# Patient Record
Sex: Male | Born: 1935 | Race: White | Hispanic: No | Marital: Married | Smoking: Former smoker
Health system: Southern US, Community
[De-identification: ages and names within clinical notes are randomized; demographics above are authoritative.]

## PROBLEM LIST (undated history)

## (undated) DIAGNOSIS — I10 Essential (primary) hypertension: Secondary | ICD-10-CM

## (undated) DIAGNOSIS — C801 Malignant (primary) neoplasm, unspecified: Secondary | ICD-10-CM

## (undated) DIAGNOSIS — N186 End stage renal disease: Secondary | ICD-10-CM

## (undated) DIAGNOSIS — I2510 Atherosclerotic heart disease of native coronary artery without angina pectoris: Secondary | ICD-10-CM

## (undated) DIAGNOSIS — J151 Pneumonia due to Pseudomonas: Secondary | ICD-10-CM

## (undated) DIAGNOSIS — I480 Paroxysmal atrial fibrillation: Secondary | ICD-10-CM

## (undated) DIAGNOSIS — R0600 Dyspnea, unspecified: Secondary | ICD-10-CM

## (undated) DIAGNOSIS — I4891 Unspecified atrial fibrillation: Secondary | ICD-10-CM

## (undated) DIAGNOSIS — L89159 Pressure ulcer of sacral region, unspecified stage: Secondary | ICD-10-CM

## (undated) DIAGNOSIS — E8809 Other disorders of plasma-protein metabolism, not elsewhere classified: Secondary | ICD-10-CM

## (undated) DIAGNOSIS — D696 Thrombocytopenia, unspecified: Secondary | ICD-10-CM

## (undated) DIAGNOSIS — L89619 Pressure ulcer of right heel, unspecified stage: Secondary | ICD-10-CM

## (undated) DIAGNOSIS — J90 Pleural effusion, not elsewhere classified: Secondary | ICD-10-CM

## (undated) DIAGNOSIS — Z9989 Dependence on other enabling machines and devices: Secondary | ICD-10-CM

## (undated) DIAGNOSIS — K209 Esophagitis, unspecified without bleeding: Secondary | ICD-10-CM

## (undated) DIAGNOSIS — I219 Acute myocardial infarction, unspecified: Secondary | ICD-10-CM

## (undated) DIAGNOSIS — I5042 Chronic combined systolic (congestive) and diastolic (congestive) heart failure: Secondary | ICD-10-CM

## (undated) DIAGNOSIS — I442 Atrioventricular block, complete: Secondary | ICD-10-CM

## (undated) DIAGNOSIS — D649 Anemia, unspecified: Secondary | ICD-10-CM

## (undated) DIAGNOSIS — R319 Hematuria, unspecified: Secondary | ICD-10-CM

## (undated) DIAGNOSIS — I255 Ischemic cardiomyopathy: Secondary | ICD-10-CM

## (undated) DIAGNOSIS — R5381 Other malaise: Secondary | ICD-10-CM

## (undated) DIAGNOSIS — Z9889 Other specified postprocedural states: Secondary | ICD-10-CM

## (undated) DIAGNOSIS — R570 Cardiogenic shock: Secondary | ICD-10-CM

## (undated) HISTORY — DX: Ischemic cardiomyopathy: I25.5

## (undated) HISTORY — DX: Hematuria, unspecified: R31.9

## (undated) HISTORY — PX: HERNIA REPAIR: SHX51

## (undated) HISTORY — DX: Other specified postprocedural states: Z98.890

## (undated) HISTORY — DX: Pneumonia due to Pseudomonas: J15.1

## (undated) HISTORY — DX: Paroxysmal atrial fibrillation: I48.0

## (undated) HISTORY — DX: Cardiogenic shock: R57.0

## (undated) HISTORY — DX: Anemia, unspecified: D64.9

## (undated) HISTORY — DX: Atherosclerotic heart disease of native coronary artery without angina pectoris: I25.10

## (undated) HISTORY — DX: Chronic combined systolic (congestive) and diastolic (congestive) heart failure: I50.42

## (undated) HISTORY — DX: Atrioventricular block, complete: I44.2

## (undated) HISTORY — DX: Pleural effusion, not elsewhere classified: J90

## (undated) HISTORY — DX: Esophagitis, unspecified without bleeding: K20.90

## (undated) HISTORY — DX: Other disorders of plasma-protein metabolism, not elsewhere classified: E88.09

## (undated) HISTORY — DX: Thrombocytopenia, unspecified: D69.6

## (undated) HISTORY — DX: End stage renal disease: N18.6

## (undated) HISTORY — DX: Other malaise: R53.81

## (undated) HISTORY — PX: PROSTATE SURGERY: SHX751

---

## 2008-03-07 ENCOUNTER — Ambulatory Visit (HOSPITAL_COMMUNITY): Admission: RE | Admit: 2008-03-07 | Discharge: 2008-03-07 | Payer: Self-pay | Admitting: Ophthalmology

## 2008-03-20 ENCOUNTER — Ambulatory Visit (HOSPITAL_COMMUNITY): Admission: RE | Admit: 2008-03-20 | Discharge: 2008-03-20 | Payer: Self-pay | Admitting: Ophthalmology

## 2010-06-01 LAB — BASIC METABOLIC PANEL
BUN: 12 mg/dL (ref 6–23)
CO2: 29 mEq/L (ref 19–32)
Calcium: 9 mg/dL (ref 8.4–10.5)
Chloride: 98 mEq/L (ref 96–112)
Creatinine, Ser: 0.74 mg/dL (ref 0.4–1.5)
GFR calc Af Amer: >60 mL/min (ref >60)
GFR calc non Af Amer: >60 mL/min (ref >60)
Glucose, Bld: 167 mg/dL — ABNORMAL HIGH (ref 70–99)
Potassium: 4 mEq/L (ref 3.5–5.1)
Sodium: 134 mEq/L — ABNORMAL LOW (ref 135–145)

## 2010-06-01 LAB — GLUCOSE, CAPILLARY: Glucose-Capillary: 113 mg/dL — ABNORMAL HIGH (ref 70–99)

## 2010-06-01 LAB — HEMOGLOBIN AND HEMATOCRIT, BLOOD
HCT: 41.9 % (ref 39.0–52.0)
Hemoglobin: 14.1 g/dL (ref 13.0–17.0)

## 2010-06-02 LAB — GLUCOSE, CAPILLARY: Glucose-Capillary: 93 mg/dL (ref 70–99)

## 2011-08-04 ENCOUNTER — Other Ambulatory Visit: Payer: Self-pay | Admitting: Radiation Oncology

## 2011-09-11 ENCOUNTER — Other Ambulatory Visit: Payer: Self-pay | Admitting: Radiation Oncology

## 2016-06-14 ENCOUNTER — Emergency Department (HOSPITAL_COMMUNITY): Payer: Medicare Other

## 2016-06-14 ENCOUNTER — Encounter (HOSPITAL_COMMUNITY): Payer: Self-pay | Admitting: Emergency Medicine

## 2016-06-14 ENCOUNTER — Inpatient Hospital Stay (HOSPITAL_COMMUNITY)
Admission: EM | Admit: 2016-06-14 | Discharge: 2016-06-17 | DRG: 309 | Disposition: A | Discharge status: Home / Self Care | Payer: Medicare Other | Attending: Family Medicine | Admitting: Family Medicine

## 2016-06-14 DIAGNOSIS — Z7901 Long term (current) use of anticoagulants: Secondary | ICD-10-CM

## 2016-06-14 DIAGNOSIS — R072 Precordial pain: Secondary | ICD-10-CM

## 2016-06-14 DIAGNOSIS — N400 Enlarged prostate without lower urinary tract symptoms: Secondary | ICD-10-CM | POA: Diagnosis not present

## 2016-06-14 DIAGNOSIS — I10 Essential (primary) hypertension: Secondary | ICD-10-CM | POA: Diagnosis present

## 2016-06-14 DIAGNOSIS — E7800 Pure hypercholesterolemia, unspecified: Secondary | ICD-10-CM | POA: Diagnosis present

## 2016-06-14 DIAGNOSIS — J309 Allergic rhinitis, unspecified: Secondary | ICD-10-CM | POA: Diagnosis not present

## 2016-06-14 DIAGNOSIS — Z955 Presence of coronary angioplasty implant and graft: Secondary | ICD-10-CM

## 2016-06-14 DIAGNOSIS — Z87891 Personal history of nicotine dependence: Secondary | ICD-10-CM

## 2016-06-14 DIAGNOSIS — K219 Gastro-esophageal reflux disease without esophagitis: Secondary | ICD-10-CM | POA: Diagnosis present

## 2016-06-14 DIAGNOSIS — J302 Other seasonal allergic rhinitis: Secondary | ICD-10-CM | POA: Diagnosis present

## 2016-06-14 DIAGNOSIS — I480 Paroxysmal atrial fibrillation: Principal | ICD-10-CM | POA: Diagnosis present

## 2016-06-14 DIAGNOSIS — Z79899 Other long term (current) drug therapy: Secondary | ICD-10-CM

## 2016-06-14 DIAGNOSIS — R0609 Other forms of dyspnea: Secondary | ICD-10-CM | POA: Diagnosis not present

## 2016-06-14 DIAGNOSIS — R079 Chest pain, unspecified: Secondary | ICD-10-CM | POA: Diagnosis not present

## 2016-06-14 DIAGNOSIS — E785 Hyperlipidemia, unspecified: Secondary | ICD-10-CM | POA: Diagnosis present

## 2016-06-14 DIAGNOSIS — Z8546 Personal history of malignant neoplasm of prostate: Secondary | ICD-10-CM

## 2016-06-14 DIAGNOSIS — I4891 Unspecified atrial fibrillation: Secondary | ICD-10-CM | POA: Diagnosis not present

## 2016-06-14 DIAGNOSIS — R0600 Dyspnea, unspecified: Secondary | ICD-10-CM

## 2016-06-14 DIAGNOSIS — I248 Other forms of acute ischemic heart disease: Secondary | ICD-10-CM | POA: Diagnosis present

## 2016-06-14 DIAGNOSIS — I2510 Atherosclerotic heart disease of native coronary artery without angina pectoris: Secondary | ICD-10-CM | POA: Diagnosis present

## 2016-06-14 DIAGNOSIS — E1165 Type 2 diabetes mellitus with hyperglycemia: Secondary | ICD-10-CM | POA: Diagnosis present

## 2016-06-14 HISTORY — DX: Malignant (primary) neoplasm, unspecified: C80.1

## 2016-06-14 HISTORY — DX: Unspecified atrial fibrillation: I48.91

## 2016-06-14 HISTORY — DX: Essential (primary) hypertension: I10

## 2016-06-14 LAB — COMPREHENSIVE METABOLIC PANEL
ALT: 17 U/L (ref 17–63)
AST: 23 U/L (ref 15–41)
Albumin: 3.9 g/dL (ref 3.5–5.0)
Alkaline Phosphatase: 42 U/L (ref 38–126)
Anion gap: 7 (ref 5–15)
BUN: 16 mg/dL (ref 6–20)
CO2: 27 mmol/L (ref 22–32)
Calcium: 8.9 mg/dL (ref 8.9–10.3)
Chloride: 101 mmol/L (ref 101–111)
Creatinine, Ser: 1.08 mg/dL (ref 0.61–1.24)
GFR calc Af Amer: >60 mL/min (ref >60)
GFR calc non Af Amer: >60 mL/min (ref >60)
Glucose, Bld: 165 mg/dL — ABNORMAL HIGH (ref 65–99)
Potassium: 4.4 mmol/L (ref 3.5–5.1)
Sodium: 135 mmol/L (ref 135–145)
Total Bilirubin: 0.6 mg/dL (ref 0.3–1.2)
Total Protein: 7.1 g/dL (ref 6.5–8.1)

## 2016-06-14 LAB — CBC WITH DIFFERENTIAL/PLATELET
Basophils Absolute: 0 10*3/uL (ref 0.0–0.1)
Basophils Relative: 0 %
Eosinophils Absolute: 0.1 10*3/uL (ref 0.0–0.7)
Eosinophils Relative: 1 %
HCT: 39.4 % (ref 39.0–52.0)
Hemoglobin: 13 g/dL (ref 13.0–17.0)
Lymphocytes Relative: 8 %
Lymphs Abs: 0.8 10*3/uL (ref 0.7–4.0)
MCH: 30 pg (ref 26.0–34.0)
MCHC: 33 g/dL (ref 30.0–36.0)
MCV: 91 fL (ref 78.0–100.0)
Monocytes Absolute: 0.6 10*3/uL (ref 0.1–1.0)
Monocytes Relative: 6 %
Neutro Abs: 8.3 10*3/uL — ABNORMAL HIGH (ref 1.7–7.7)
Neutrophils Relative %: 85 %
Platelets: 203 10*3/uL (ref 150–400)
RBC: 4.33 MIL/uL (ref 4.22–5.81)
RDW: 14.1 % (ref 11.5–15.5)
WBC: 9.8 10*3/uL (ref 4.0–10.5)

## 2016-06-14 LAB — LIPID PANEL
Cholesterol: 110 mg/dL (ref 0–200)
HDL: 31 mg/dL — ABNORMAL LOW (ref >40)
LDL Cholesterol: 57 mg/dL (ref 0–99)
Total CHOL/HDL Ratio: 3.5 RATIO
Triglycerides: 108 mg/dL (ref <150)
VLDL: 22 mg/dL (ref 0–40)

## 2016-06-14 LAB — URINALYSIS, ROUTINE W REFLEX MICROSCOPIC
Bilirubin Urine: NEGATIVE
Glucose, UA: NEGATIVE mg/dL
Hgb urine dipstick: NEGATIVE
Ketones, ur: NEGATIVE mg/dL
Leukocytes, UA: NEGATIVE
Nitrite: NEGATIVE
Protein, ur: NEGATIVE mg/dL
Specific Gravity, Urine: 1.017 (ref 1.005–1.030)
pH: 6 (ref 5.0–8.0)

## 2016-06-14 LAB — TSH: TSH: 1.935 u[IU]/mL (ref 0.350–4.500)

## 2016-06-14 LAB — T4, FREE: Free T4: 1.44 ng/dL — ABNORMAL HIGH (ref 0.61–1.12)

## 2016-06-14 LAB — I-STAT TROPONIN, ED
Troponin i, poc: 0.01 ng/mL (ref 0.00–0.08)
Troponin i, poc: 0.05 ng/mL (ref 0.00–0.08)

## 2016-06-14 LAB — TROPONIN I: Troponin I: 0.17 ng/mL (ref <0.03)

## 2016-06-14 LAB — BRAIN NATRIURETIC PEPTIDE: B Natriuretic Peptide: 342 pg/mL — ABNORMAL HIGH (ref 0.0–100.0)

## 2016-06-14 LAB — MRSA PCR SCREENING: MRSA by PCR: NEGATIVE

## 2016-06-14 MED ORDER — METOPROLOL TARTRATE 5 MG/5ML IV SOLN: 5.0000 mg | Freq: Once | INTRAVENOUS | Status: DC

## 2016-06-14 MED ORDER — ATORVASTATIN CALCIUM 10 MG PO TABS
10.0000 mg | ORAL_TABLET | Freq: Every day | ORAL | Status: DC
  Administered 2016-06-14 – 2016-06-16 (×3): 10 mg via ORAL
  Filled 2016-06-14 (×3): qty 1

## 2016-06-14 MED ORDER — LORATADINE 10 MG PO TABS
10.0000 mg | ORAL_TABLET | Freq: Every day | ORAL | Status: DC
  Administered 2016-06-15 – 2016-06-17 (×3): 10 mg via ORAL
  Filled 2016-06-14 (×3): qty 1

## 2016-06-14 MED ORDER — SENNOSIDES-DOCUSATE SODIUM 8.6-50 MG PO TABS: 1.0000 | ORAL_TABLET | Freq: Every evening | ORAL | Status: DC | PRN

## 2016-06-14 MED ORDER — METOPROLOL TARTRATE 50 MG PO TABS: 75.0000 mg | ORAL_TABLET | Freq: Once | ORAL | Status: DC

## 2016-06-14 MED ORDER — SODIUM CHLORIDE 0.9 % IV BOLUS (SEPSIS)
500.0000 mL | Freq: Once | INTRAVENOUS | Status: AC
  Administered 2016-06-14: 500 mL via INTRAVENOUS

## 2016-06-14 MED ORDER — DILTIAZEM HCL-DEXTROSE 100-5 MG/100ML-% IV SOLN (PREMIX): 5.0000 mg/h | Freq: Once | INTRAVENOUS | Status: DC

## 2016-06-14 MED ORDER — METOPROLOL TARTRATE 25 MG PO TABS
75.0000 mg | ORAL_TABLET | Freq: Once | ORAL | Status: AC
  Administered 2016-06-14: 75 mg via ORAL
  Filled 2016-06-14: qty 3

## 2016-06-14 MED ORDER — SIMVASTATIN 20 MG PO TABS: 20.0000 mg | ORAL_TABLET | Freq: Every day | ORAL | Status: DC

## 2016-06-14 MED ORDER — DILTIAZEM HCL-DEXTROSE 100-5 MG/100ML-% IV SOLN (PREMIX)
15.0000 mg/h | INTRAVENOUS | Status: DC
  Administered 2016-06-15: 15 mg/h via INTRAVENOUS
  Administered 2016-06-15: 10 mg/h via INTRAVENOUS
  Administered 2016-06-15: 7.5 mg/h via INTRAVENOUS
  Filled 2016-06-14 (×3): qty 100

## 2016-06-14 MED ORDER — ACETAMINOPHEN 650 MG RE SUPP: 650.0000 mg | Freq: Four times a day (QID) | RECTAL | Status: DC | PRN

## 2016-06-14 MED ORDER — RIVAROXABAN 20 MG PO TABS
20.0000 mg | ORAL_TABLET | Freq: Every day | ORAL | Status: DC
  Administered 2016-06-14 – 2016-06-16 (×3): 20 mg via ORAL
  Filled 2016-06-14 (×3): qty 1

## 2016-06-14 MED ORDER — TAMSULOSIN HCL 0.4 MG PO CAPS
0.4000 mg | ORAL_CAPSULE | Freq: Every day | ORAL | Status: DC
  Administered 2016-06-15 – 2016-06-17 (×3): 0.4 mg via ORAL
  Filled 2016-06-14 (×3): qty 1

## 2016-06-14 MED ORDER — ONDANSETRON HCL 4 MG PO TABS: 4.0000 mg | ORAL_TABLET | Freq: Four times a day (QID) | ORAL | Status: DC | PRN

## 2016-06-14 MED ORDER — ACETAMINOPHEN 325 MG PO TABS: 650.0000 mg | ORAL_TABLET | Freq: Four times a day (QID) | ORAL | Status: DC | PRN

## 2016-06-14 MED ORDER — DILTIAZEM HCL-DEXTROSE 100-5 MG/100ML-% IV SOLN (PREMIX)
INTRAVENOUS | Status: AC
  Filled 2016-06-14: qty 100

## 2016-06-14 MED ORDER — SODIUM CHLORIDE 0.9 % IV SOLN
INTRAVENOUS | Status: DC
  Administered 2016-06-14: 18:00:00 via INTRAVENOUS

## 2016-06-14 MED ORDER — PANTOPRAZOLE SODIUM 40 MG PO TBEC
40.0000 mg | DELAYED_RELEASE_TABLET | Freq: Every day | ORAL | Status: DC
  Administered 2016-06-14 – 2016-06-17 (×4): 40 mg via ORAL
  Filled 2016-06-14 (×4): qty 1

## 2016-06-14 MED ORDER — DILTIAZEM HCL 25 MG/5ML IV SOLN
10.0000 mg | Freq: Once | INTRAVENOUS | Status: AC
  Administered 2016-06-14: 10 mg via INTRAVENOUS

## 2016-06-14 MED ORDER — METOPROLOL TARTRATE 75 MG PO TABS: 75.0000 mg | ORAL_TABLET | Freq: Two times a day (BID) | ORAL | 0 refills | Status: DC

## 2016-06-14 MED ORDER — ONDANSETRON HCL 4 MG/2ML IJ SOLN: 4.0000 mg | Freq: Four times a day (QID) | INTRAMUSCULAR | Status: DC | PRN

## 2016-06-14 NOTE — Progress Notes (Signed)
CRITICAL VALUE ALERT  Critical value received:  Troponin 0.17  Date of notification:  06/14/16  Time of notification:  6725  Critical value read back:Yes.    Nurse who received alert:  Aldona Lento, RN  MD notified (1st page):  Dr.Sheikh  Time of first page:  1853  MD notified (2nd page):  Time of second page:  Responding MD:   Time MD responded:

## 2016-06-14 NOTE — ED Notes (Addendum)
Pt ambulated around nursing desk and tolerated well. Hr between 97-128,steady gait, edp aware. Sob quickly resolved after sitting. Pt denies pain

## 2016-06-14 NOTE — ED Notes (Signed)
Pt stood to use urinal and hr around 110 and sob. Pt states that is nromal for him

## 2016-06-14 NOTE — ED Triage Notes (Signed)
Pt reports hx of afib without RVR.  States for the past 3 days he has been having chest pain with heaviness in center of chest going to left arm/shoulder.  Also short of breath.

## 2016-06-14 NOTE — ED Provider Notes (Signed)
[facility] DEPT Provider Note   CSN: [id number] Arrival date & time: 06/14/16  9518  By signing my name below, I, Higinio Plan, attest that this documentation has been prepared under the direction and in the presence of Julianne Rice, MD . Electronically Signed: Higinio Plan, Scribe. 06/14/2016. 10:17 AM.  History   Chief Complaint Chief Complaint  Patient presents with  . Chest Pain  . Tachycardia   The history is provided by the patient. No language interpreter was used.   HPI Comments: Jesse Murphy is a 83 y.o. male with PMHx of A-Fib on Xarelto and cardiac stent placement in 1992, brought in by EMS to the Emergency Department complaining of gradually worsening, constant, bilateral chest pain that began 3 days ago. Pt describes his pain as heaviness across his chest that radiates down his left arm and shoulder. He states associated shortness of breath and cough. He notes he takes all of his medications as prescribed and has not missed any doses. Pt denies any fever, recent sickness, or leg swelling.   Past Medical History:  Diagnosis Date  . Atrial fibrillation ([place])   . Cancer [facility])    prostate  . Hypertension     There are no active problems to display for this patient.  Past Surgical History:  Procedure Laterality Date  . HERNIA REPAIR      Home Medications    Prior to Admission medications   Medication Sig Start Date End Date Taking? Authorizing Provider  loratadine (CLARITIN) 10 MG tablet Take 10 mg by mouth daily.   Yes Historical Provider, MD  omeprazole (PRILOSEC) 20 MG capsule Take 20 mg by mouth daily as needed (acid reflux).   Yes Historical Provider, MD  rivaroxaban (XARELTO) 20 MG TABS tablet Take 20 mg by mouth daily with supper.   Yes Historical Provider, MD  simvastatin (ZOCOR) 20 MG tablet Take 20 mg by mouth daily.   Yes Historical Provider, MD  tamsulosin (FLOMAX) 0.4 MG CAPS capsule Take 0.4 mg by mouth daily.   Yes Historical Provider, MD    metoprolol 75 MG TABS Take 75 mg by mouth 2 (two) times daily. 06/14/16   Julianne Rice, MD    Family History History reviewed. No pertinent family history.  Social History Social History  Substance Use Topics  . Smoking status: Former Smoker    Types: Cigarettes    Quit date: 02/15/1990  . Smokeless tobacco: Never Used  . Alcohol use No   Allergies   Patient has no known allergies.  Review of Systems Review of Systems  Constitutional: Negative for fever.  Respiratory: Positive for cough, chest tightness and shortness of breath. Negative for wheezing.   Cardiovascular: Positive for chest pain and palpitations. Negative for leg swelling.  Gastrointestinal: Negative for abdominal pain, nausea and vomiting.  Musculoskeletal: Negative for back pain and myalgias.  Skin: Negative for rash and wound.  Neurological: Negative for dizziness, weakness, light-headedness, numbness and headaches.  All other systems reviewed and are negative.  Physical Exam Updated Vital Signs BP (!) 122/96 (BP Location: Left Arm)   Pulse (!) 147   Temp 98.1 F (36.7 C) (Oral)   Resp (!) 22   Ht 5\' 9"  (1.753 m)   Wt 206 lb (93.4 kg)   SpO2 96%   BMI 30.42 kg/m   Physical Exam  Constitutional: He is oriented to person, place, and time. He appears well-developed and well-nourished.  HENT:  Head: Normocephalic and atraumatic.  Mouth/Throat: Oropharynx is clear and  moist.  Eyes: EOM are normal. Pupils are equal, round, and reactive to light.  Neck: Normal range of motion. Neck supple.  Cardiovascular:  Tachycardia. Irregularly irregular  Pulmonary/Chest: Effort normal and breath sounds normal.  Abdominal: Soft. Bowel sounds are normal. There is no tenderness. There is no rebound and no guarding.  Musculoskeletal: Normal range of motion. He exhibits no edema or tenderness.  No lower extremity swelling or asymmetry.  Neurological: He is alert and oriented to person, place, and time.  Moving all  extremities without deficit. Sensation fully intact.  Skin: Skin is warm and dry. No rash noted. No erythema.  Psychiatric: He has a normal mood and affect. His behavior is normal.  Nursing note and vitals reviewed.   ED Treatments / Results  DIAGNOSTIC STUDIES:  Oxygen Saturation is 96% on RA, normal by my interpretation.    COORDINATION OF CARE:  10:17 AM Discussed treatment plan with pt at bedside and pt agreed to plan.  Labs (all labs ordered are listed, but only abnormal results are displayed) Labs Reviewed  CBC WITH DIFFERENTIAL/PLATELET - Abnormal; Notable for the following:       Result Value   Neutro Abs 8.3 (*)    All other components within normal limits  COMPREHENSIVE METABOLIC PANEL - Abnormal; Notable for the following:    Glucose, Bld 165 (*)    All other components within normal limits  URINALYSIS, ROUTINE W REFLEX MICROSCOPIC  I-STAT TROPOININ, ED  I-STAT TROPOININ, ED    EKG  EKG Interpretation  Date/Time:  Monday June 14 2016 10:04:58 EDT Ventricular Rate:  140 PR Interval:    QRS Duration: 125 QT Interval:  312 QTC Calculation: 477 R Axis:   87 Text Interpretation:  Wide-QRS tachycardia Nonspecific intraventricular conduction delay Confirmed by Lita Mains  MD, Srijan Givan ([id number]) on 06/14/2016 2:59:30 PM       Radiology Dg Chest Port 1 View  Result Date: 06/14/2016 CLINICAL DATA:  Anterior chest pain for 3 days. EXAM: PORTABLE CHEST 1 VIEW COMPARISON:  07/29/2014. FINDINGS: 1022 hour. Lordotic positioning. The heart size and mediastinal contours are normal. The lungs are clear. There is no pleural effusion or pneumothorax. No acute osseous findings are identified. Telemetry leads overlie the chest. IMPRESSION: Stable chest.  No active cardiopulmonary process. Electronically Signed   By: Richardean Sale M.D.   On: 06/14/2016 10:37    Procedures Procedures (including critical care time)  Medications Ordered in ED Medications  dilTIAZem HCl-Dextrose  100-5 MG/100ML-% infusion SOLN (5 mg  Rate/Dose Change 06/14/16 1301)  diltiazem (CARDIZEM) 100 mg in dextrose 5% 170mL (1 mg/mL) infusion (not administered)  sodium chloride 0.9 % bolus 500 mL (not administered)  diltiazem (CARDIZEM) injection 10 mg (10 mg Intravenous Bolus 06/14/16 1027)  metoprolol tartrate (LOPRESSOR) tablet 75 mg (75 mg Oral Given 06/14/16 1319)   CRITICAL CARE Performed by: Lita Mains, Miley Blanchett Total critical care time: 40 minutes Critical care time was exclusive of separately billable procedures and treating other patients. Critical care was necessary to treat or prevent imminent or life-threatening deterioration. Critical care was time spent personally by me on the following activities: development of treatment plan with patient and/or surrogate as well as nursing, discussions with consultants, evaluation of patient's response to treatment, examination of patient, obtaining history from patient or surrogate, ordering and performing treatments and interventions, ordering and review of laboratory studies, ordering and review of radiographic studies, pulse oximetry and re-evaluation of patient's condition. Initial Impression / Assessment and Plan / ED Course  I have reviewed the triage vital signs and the nursing notes.  Pertinent labs & imaging results that were available during my care of the patient were reviewed by me and considered in my medical decision making (see chart for details).     I personally performed the services described in this documentation, which was scribed in my presence. The recorded information has been reviewed and is accurate.   Patient states his symptoms have significantly improved after IV dose of Cardizem. Heart rate is around 100. Blood pressure remained stable.  Patient given dose of by mouth metoprolol. Heart rate is elevated his and to the 120s. Patient denies any chest pain or shortness of breath at rest but with ambulation becomes unsteady  and shortness breath. Initiated Cardizem drip. Discussed with hospitalist who will admit patient for stepdown observation. Troponin 2 are negative. Final Clinical Impressions(s) / ED Diagnoses   Final diagnoses:  Atrial fibrillation with RVR (HCC)  DOE (dyspnea on exertion)  Precordial pain    New Prescriptions Current Discharge Medication List       Julianne Rice, MD 06/14/16 1500

## 2016-06-14 NOTE — H&P (Signed)
History and Physical    Jesse Murphy WFU:[id number] DOB: [birth year] DOA: 06/14/2016  PCP: Deloria Lair, MD   Patient coming from: Home  Chief Complaint: Palpitations, Chest Pressure and SOB  HPI: Jesse Murphy is a 83 y.o. male with medical history significant of HTN, HLD, ?DM (was previously on Metformin), BPH, Seasonal Allergies/Allergic Rhinitis, GERD, Paroxysmal Atrial Fibrillation on Anticoagulation with Xarelto who presented to [facility] ED with a cc of Chest Pressure that radiated into his Left arm and shoulder, Palpations and Shortness of Breath worsening while ambulating. Patient has a Hx of CAD and is s/p a stent in 1994. Patient states 3 days ago he was digging leaves out of his ditch when he started developing Palpitations and SOB which progressed to chest pressure that radiated to his arms and shoulder. He stated that he becomes more winded and family has noted that he wasn't able to walk as far before getting SOB. Patient has had very mild leg swelling. Describes the pain/heaviness as a 9/10 in severity but has gone away now. Denied N/V or lightheadedness or dizziness. No diaphoresis. Workup in the ED showed patient was in A Fib with RVR. [facility] was called to admit the patient.   ED Course: Was given po Metoprolol, IV Diltiazem, and placed on Diltiazem gtt and a 500 NS bolus was orderd (not currently given). Bloodwork was done and POC Troponin was ordered x2 (first 0.01 and second 0.05). EKG was done and CXR was done as well (showed stable chest with no active cardiopulmonary disease.)  Review of Systems: As per HPI otherwise 10 point review of systems negative. Denied Constipation or Diarrhea. No Urinary difficulties.   Past Medical History:  Diagnosis Date  . Atrial fibrillation ([place])   . Cancer [facility])    prostate  . Hypertension     Past Surgical History:  Procedure Laterality Date  . HERNIA REPAIR     SOCIAL HISTORY  reports that he quit smoking about 26 years ago. His  smoking use included Cigarettes. He has never used smokeless tobacco. He reports that he does not drink alcohol or use drugs.  No Known Allergies  FAMILY HISTORY History reviewed with patient and he stated that his entire family had "heart troubles" but was unable to specify what troubles these were.   Prior to Admission medications   Medication Sig Start Date End Date Taking? Authorizing Provider  loratadine (CLARITIN) 10 MG tablet Take 10 mg by mouth daily.   Yes Historical Provider, MD  omeprazole (PRILOSEC) 20 MG capsule Take 20 mg by mouth daily as needed (acid reflux).   Yes Historical Provider, MD  rivaroxaban (XARELTO) 20 MG TABS tablet Take 20 mg by mouth daily with supper.   Yes Historical Provider, MD  simvastatin (ZOCOR) 20 MG tablet Take 20 mg by mouth daily.   Yes Historical Provider, MD  tamsulosin (FLOMAX) 0.4 MG CAPS capsule Take 0.4 mg by mouth daily.   Yes Historical Provider, MD  metoprolol 75 MG TABS Take 75 mg by mouth 2 (two) times daily. 06/14/16   Julianne Rice, MD   Physical Exam: Vitals:   06/14/16 1403 06/14/16 1404 06/14/16 1405 06/14/16 1406  BP: 117/79     Pulse: (!) 127 (!) 120 (!) 102 (!) 42  Resp: 20 11 19  (!) 21  Temp:      TempSrc:      SpO2: 96% 96% 97% 97%  Weight:      Height:  Constitutional: WN/WD, NAD Caucasian male who and appears calm and comfortable and younger than stated age Eyes: Lids and conjunctivae normal, sclerae anicteric  ENMT: External Ears, Nose appear normal. Grossly normal hearing. Mucous membranes are moist.  Neck: Appears normal, supple, no cervical masses, normal ROM, no appreciable thyromegaly, no appreciable JVD Respiratory: Diminished but clear to auscultation bilaterally, no wheezing, rales, rhonchi or crackles. Normal respiratory effort and patient is not tachypenic. No accessory muscle use.  Cardiovascular: Irregularly Irregular Rate and Tachycardic Rhythm, no murmurs / rubs / gallops. S1 and S2 auscultated.  Trace lower extremity edema bilaterllay.  Abdomen: Soft, non-tender, non-distended. No masses palpated. No appreciable hepatosplenomegaly. Bowel sounds positive x4.  GU: Deferred. Musculoskeletal: No clubbing / cyanosis of digits/nails. No joint deformity upper and lower extremities. Good ROM, no contractures.  Skin: No rashes, lesions, ulcers on limited skin evaluation. No induration; Warm and dry.  Neurologic: CN 2-12 grossly intact with no focal deficits. Romberg sign cerebellar reflexes not assessed.  Psychiatric: Normal judgment and insight. Alert and oriented x 3. Normal mood and appropriate affect.   Labs on Admission: I have personally reviewed following labs and imaging studies  CBC:  Recent Labs Lab 06/14/16 1035  WBC 9.8  NEUTROABS 8.3*  HGB 13.0  HCT 39.4  MCV 91.0  PLT 177   Basic Metabolic Panel:  Recent Labs Lab 06/14/16 1035  NA 135  K 4.4  CL 101  CO2 27  GLUCOSE 165*  BUN 16  CREATININE 1.08  CALCIUM 8.9   GFR: Estimated Creatinine Clearance: 56.4 mL/min (by C-G formula based on SCr of 1.08 mg/dL). Liver Function Tests:  Recent Labs Lab 06/14/16 1035  AST 23  ALT 17  ALKPHOS 42  BILITOT 0.6  PROT 7.1  ALBUMIN 3.9   No results for input(s): LIPASE, AMYLASE in the last 168 hours. No results for input(s): AMMONIA in the last 168 hours. Coagulation Profile: No results for input(s): INR, PROTIME in the last 168 hours. Cardiac Enzymes: No results for input(s): CKTOTAL, CKMB, CKMBINDEX, TROPONINI in the last 168 hours. BNP (last 3 results) No results for input(s): PROBNP in the last 8760 hours. HbA1C: No results for input(s): HGBA1C in the last 72 hours. CBG: No results for input(s): GLUCAP in the last 168 hours. Lipid Profile: No results for input(s): CHOL, HDL, LDLCALC, TRIG, CHOLHDL, LDLDIRECT in the last 72 hours. Thyroid Function Tests: No results for input(s): TSH, T4TOTAL, FREET4, T3FREE, THYROIDAB in the last 72 hours. Anemia  Panel: No results for input(s): VITAMINB12, FOLATE, FERRITIN, TIBC, IRON, RETICCTPCT in the last 72 hours. Urine analysis:    Component Value Date/Time   COLORURINE YELLOW 06/14/2016 1019   APPEARANCEUR CLEAR 06/14/2016 1019   LABSPEC 1.017 06/14/2016 1019   PHURINE 6.0 06/14/2016 1019   GLUCOSEU NEGATIVE 06/14/2016 1019   HGBUR NEGATIVE 06/14/2016 1019   BILIRUBINUR NEGATIVE 06/14/2016 1019   KETONESUR NEGATIVE 06/14/2016 1019   PROTEINUR NEGATIVE 06/14/2016 1019   NITRITE NEGATIVE 06/14/2016 1019   LEUKOCYTESUR NEGATIVE 06/14/2016 1019   Sepsis Labs: !!!!!!!!!!!!!!!!!!!!!!!!!!!!!!!!!!!!!!!!!!!! @LABRCNTIP (procalcitonin:4,lacticidven:4) )No results found for this or any previous visit (from the past 240 hour(s)).   Radiological Exams on Admission: Dg Chest Port 1 View  Result Date: 06/14/2016 CLINICAL DATA:  Anterior chest pain for 3 days. EXAM: PORTABLE CHEST 1 VIEW COMPARISON:  07/29/2014. FINDINGS: 1022 hour. Lordotic positioning. The heart size and mediastinal contours are normal. The lungs are clear. There is no pleural effusion or pneumothorax. No acute osseous findings are identified. Telemetry  leads overlie the chest. IMPRESSION: Stable chest.  No active cardiopulmonary process. Electronically Signed   By: Richardean Sale M.D.   On: 06/14/2016 10:37   EKG: Independently reviewed. Showed A Fib with RVR with an approximate rate of 110 and had Ashman beats. No evidence of ST Elevation on my interpretation but had some ST Depression likely rate related.   Assessment/Plan Active Problems:   Atrial fibrillation with RVR (HCC)  Paroxysmal Atrial Fibrillation with RVR -Place in Obs SDU; HR usually Rate Controlled -Given 75 mg of po Metoprolol, IV 10 mg of Cardizem, and placed on Cardizem gtt in ED -C/w Cardizem gtt and titrate off -Obtain ECHOCardiogram when rate is better controlled -Anticoagulated with Xarelto 20 mg po qHS as CHADS2VASc was at least 3 for Age and HTN -Will  need to place on po Control with Metoprolol (Takes 50 mg po BID) -Consulted Cardiology for additional Recc's and Evaluation -Check TSH, Free T4, Mag, and Phos -Given NS Bolus 500 mL in ED; Will Hydrate with NS at 75 mL/hr  Chest Pressure r/o ACS -Likely from Uncontrolled Heart Rate; Patient complained of Chest Pressure and SOB along with not being able to ambulate without getting SOB -Has Hx of CAD s/p Stent in 1994 -Cycle Cardiac Troponin I x 3; First POC Troponin was 0.01 and next POC Troponin was 0.05 -Check ECHOCardiogram -Hx of Abnormal Stress Test in 2016; Placed on Medical Therapy at that time. -Check TSH, Free T4, Mag, Phos, Lipid Panel, and HbA1c -Consulted Cardiology for additional Recc's and Evaluation  Dyspena on Exertion -Likely from Above -Check ECHOcardiogram -Appreciate Cardiology Consultation for additional Recc's and Evaluation -Will need PT Evaluation prior to D/C  Seasonal Allergies/Allergic Rhinitis -C/w Loratadine 10 mg po Daily -Also Takes Fluticasone 50 mcg/Actuation Nasal Spar  GERD -C/w PPI Pharmacy Protonix 40 mg po Substitution  Hyperlipidemia -Check Lipid Panel -C/w Simvastatin 20 mgpo Daily  BPH; Hx of Prostate Cancer -C/w Tamsulosin 0.4 mg po Daily  Hyperglycemia -Check HbA1c -Per prior record from [place] showed patient was on Metformin 500 mg po Daily  DVT prophylaxis: Anticoagulated with Xarelto 20 mg po qHS Code Status: FULL CODE Family Communication: Discussed with wife and daughter at bedside Disposition Plan: Likely Home at D/C Consults called: Cardiology Admission status: Obs/SDU   Kerney Elbe, D.O. [facility] Pager [phone]  If 7PM-7AM, please contact night-coverage [email or web address] Password [email or web address]  06/14/2016, 3:14 PM

## 2016-06-14 NOTE — ED Notes (Signed)
Per edp, will admit pt.

## 2016-06-14 NOTE — ED Notes (Signed)
Bolus of Cardizem given from bag.  No drip started at this time.  Pt states he feels better and is not as short of breath.  MD made aware.

## 2016-06-14 NOTE — ED Notes (Signed)
Pharmacy checking on metoprolol

## 2016-06-15 ENCOUNTER — Observation Stay (HOSPITAL_BASED_OUTPATIENT_CLINIC_OR_DEPARTMENT_OTHER): Payer: Medicare Other

## 2016-06-15 ENCOUNTER — Encounter (HOSPITAL_COMMUNITY): Payer: Self-pay | Admitting: Adult Health

## 2016-06-15 ENCOUNTER — Observation Stay (HOSPITAL_COMMUNITY): Payer: Medicare Other

## 2016-06-15 DIAGNOSIS — N400 Enlarged prostate without lower urinary tract symptoms: Secondary | ICD-10-CM | POA: Diagnosis not present

## 2016-06-15 DIAGNOSIS — I480 Paroxysmal atrial fibrillation: Secondary | ICD-10-CM | POA: Diagnosis present

## 2016-06-15 DIAGNOSIS — I25118 Atherosclerotic heart disease of native coronary artery with other forms of angina pectoris: Secondary | ICD-10-CM

## 2016-06-15 DIAGNOSIS — E1165 Type 2 diabetes mellitus with hyperglycemia: Secondary | ICD-10-CM | POA: Diagnosis present

## 2016-06-15 DIAGNOSIS — Z955 Presence of coronary angioplasty implant and graft: Secondary | ICD-10-CM | POA: Diagnosis not present

## 2016-06-15 DIAGNOSIS — E785 Hyperlipidemia, unspecified: Secondary | ICD-10-CM | POA: Diagnosis present

## 2016-06-15 DIAGNOSIS — R072 Precordial pain: Secondary | ICD-10-CM | POA: Diagnosis present

## 2016-06-15 DIAGNOSIS — E782 Mixed hyperlipidemia: Secondary | ICD-10-CM

## 2016-06-15 DIAGNOSIS — R0609 Other forms of dyspnea: Secondary | ICD-10-CM

## 2016-06-15 DIAGNOSIS — R079 Chest pain, unspecified: Secondary | ICD-10-CM

## 2016-06-15 DIAGNOSIS — I4891 Unspecified atrial fibrillation: Secondary | ICD-10-CM

## 2016-06-15 DIAGNOSIS — I10 Essential (primary) hypertension: Secondary | ICD-10-CM | POA: Diagnosis present

## 2016-06-15 DIAGNOSIS — Z7901 Long term (current) use of anticoagulants: Secondary | ICD-10-CM | POA: Diagnosis not present

## 2016-06-15 DIAGNOSIS — K219 Gastro-esophageal reflux disease without esophagitis: Secondary | ICD-10-CM | POA: Diagnosis present

## 2016-06-15 DIAGNOSIS — J302 Other seasonal allergic rhinitis: Secondary | ICD-10-CM | POA: Diagnosis present

## 2016-06-15 DIAGNOSIS — E7800 Pure hypercholesterolemia, unspecified: Secondary | ICD-10-CM | POA: Diagnosis present

## 2016-06-15 DIAGNOSIS — Z9861 Coronary angioplasty status: Secondary | ICD-10-CM

## 2016-06-15 DIAGNOSIS — I2510 Atherosclerotic heart disease of native coronary artery without angina pectoris: Secondary | ICD-10-CM | POA: Diagnosis present

## 2016-06-15 DIAGNOSIS — I248 Other forms of acute ischemic heart disease: Secondary | ICD-10-CM | POA: Diagnosis present

## 2016-06-15 DIAGNOSIS — Z87891 Personal history of nicotine dependence: Secondary | ICD-10-CM | POA: Diagnosis not present

## 2016-06-15 DIAGNOSIS — Z8546 Personal history of malignant neoplasm of prostate: Secondary | ICD-10-CM | POA: Diagnosis not present

## 2016-06-15 DIAGNOSIS — Z79899 Other long term (current) drug therapy: Secondary | ICD-10-CM | POA: Diagnosis not present

## 2016-06-15 DIAGNOSIS — J309 Allergic rhinitis, unspecified: Secondary | ICD-10-CM | POA: Diagnosis not present

## 2016-06-15 LAB — CBC WITH DIFFERENTIAL/PLATELET
Basophils Absolute: 0 10*3/uL (ref 0.0–0.1)
Basophils Relative: 0 %
Eosinophils Absolute: 0.1 10*3/uL (ref 0.0–0.7)
Eosinophils Relative: 1 %
HCT: 41.4 % (ref 39.0–52.0)
Hemoglobin: 13.6 g/dL (ref 13.0–17.0)
Lymphocytes Relative: 8 %
Lymphs Abs: 0.9 10*3/uL (ref 0.7–4.0)
MCH: 29.9 pg (ref 26.0–34.0)
MCHC: 32.9 g/dL (ref 30.0–36.0)
MCV: 91 fL (ref 78.0–100.0)
Monocytes Absolute: 1 10*3/uL (ref 0.1–1.0)
Monocytes Relative: 9 %
Neutro Abs: 9 10*3/uL — ABNORMAL HIGH (ref 1.7–7.7)
Neutrophils Relative %: 82 %
Platelets: 196 10*3/uL (ref 150–400)
RBC: 4.55 MIL/uL (ref 4.22–5.81)
RDW: 14.4 % (ref 11.5–15.5)
WBC: 11 10*3/uL — ABNORMAL HIGH (ref 4.0–10.5)

## 2016-06-15 LAB — COMPREHENSIVE METABOLIC PANEL
ALT: 15 U/L — ABNORMAL LOW (ref 17–63)
AST: 21 U/L (ref 15–41)
Albumin: 4.1 g/dL (ref 3.5–5.0)
Alkaline Phosphatase: 47 U/L (ref 38–126)
Anion gap: 11 (ref 5–15)
BUN: 15 mg/dL (ref 6–20)
CO2: 27 mmol/L (ref 22–32)
Calcium: 8.7 mg/dL — ABNORMAL LOW (ref 8.9–10.3)
Chloride: 98 mmol/L — ABNORMAL LOW (ref 101–111)
Creatinine, Ser: 0.99 mg/dL (ref 0.61–1.24)
GFR calc Af Amer: >60 mL/min (ref >60)
GFR calc non Af Amer: >60 mL/min (ref >60)
Glucose, Bld: 142 mg/dL — ABNORMAL HIGH (ref 65–99)
Potassium: 3.7 mmol/L (ref 3.5–5.1)
Sodium: 136 mmol/L (ref 135–145)
Total Bilirubin: 1.2 mg/dL (ref 0.3–1.2)
Total Protein: 7.6 g/dL (ref 6.5–8.1)

## 2016-06-15 LAB — PHOSPHORUS: Phosphorus: 2.9 mg/dL (ref 2.5–4.6)

## 2016-06-15 LAB — ECHOCARDIOGRAM COMPLETE
Height: 69 in
Weight: 3276.92 oz

## 2016-06-15 LAB — LIPID PANEL
Cholesterol: 115 mg/dL (ref 0–200)
HDL: 33 mg/dL — ABNORMAL LOW (ref >40)
LDL Cholesterol: 62 mg/dL (ref 0–99)
Total CHOL/HDL Ratio: 3.5 RATIO
Triglycerides: 98 mg/dL (ref <150)
VLDL: 20 mg/dL (ref 0–40)

## 2016-06-15 LAB — TROPONIN I
Troponin I: 0.15 ng/mL (ref <0.03)
Troponin I: 0.13 ng/mL (ref <0.03)

## 2016-06-15 LAB — MAGNESIUM: Magnesium: 1.7 mg/dL (ref 1.7–2.4)

## 2016-06-15 MED ORDER — IPRATROPIUM-ALBUTEROL 0.5-2.5 (3) MG/3ML IN SOLN
3.0000 mL | RESPIRATORY_TRACT | Status: DC | PRN
  Administered 2016-06-15: 3 mL via RESPIRATORY_TRACT
  Filled 2016-06-15: qty 3

## 2016-06-15 MED ORDER — DILTIAZEM LOAD VIA INFUSION
20.0000 mg | Freq: Once | INTRAVENOUS | Status: AC
  Administered 2016-06-15: 20 mg via INTRAVENOUS
  Filled 2016-06-15: qty 20

## 2016-06-15 MED ORDER — AMIODARONE IV BOLUS ONLY 150 MG/100ML
150.0000 mg | Freq: Once | INTRAVENOUS | Status: AC
Start: 2016-06-15 — End: 2016-06-15
  Administered 2016-06-15: 150 mg via INTRAVENOUS
  Filled 2016-06-15: qty 100

## 2016-06-15 MED ORDER — AMIODARONE HCL 150 MG/3ML IV SOLN
150.0000 mg | Freq: Once | INTRAVENOUS | Status: DC
  Filled 2016-06-15: qty 3

## 2016-06-15 MED ORDER — PERFLUTREN LIPID MICROSPHERE
1.0000 mL | INTRAVENOUS | Status: AC | PRN
  Administered 2016-06-15: 4 mL via INTRAVENOUS
  Filled 2016-06-15: qty 10

## 2016-06-15 MED ORDER — SALINE SPRAY 0.65 % NA SOLN: 1.0000 | NASAL | Status: DC | PRN

## 2016-06-15 MED ORDER — FUROSEMIDE 10 MG/ML IJ SOLN
40.0000 mg | Freq: Once | INTRAMUSCULAR | Status: AC
  Administered 2016-06-15: 40 mg via INTRAVENOUS
  Filled 2016-06-15: qty 4

## 2016-06-15 MED ORDER — METOPROLOL TARTRATE 50 MG PO TABS
50.0000 mg | ORAL_TABLET | Freq: Two times a day (BID) | ORAL | Status: DC
  Administered 2016-06-15 – 2016-06-17 (×5): 50 mg via ORAL
  Filled 2016-06-15 (×5): qty 1

## 2016-06-15 NOTE — Progress Notes (Signed)
PROGRESS NOTE    Jesse Murphy  HYI:[id number] DOB: [birth year] DOA: 06/14/2016 PCP: Deloria Lair, MD   Brief Narrative:  Jesse Murphy is a 83 y.o. male with medical history significant of HTN, HLD, ?DM (was previously on Metformin), BPH, Seasonal Allergies/Allergic Rhinitis, GERD, Paroxysmal Atrial Fibrillation on Anticoagulation with Xarelto who presented to [facility] ED with a cc of Chest Pressure that radiated into his Left arm and shoulder, Palpations and Shortness of Breath worsening while ambulating. Patient has a Hx of CAD and is s/p a stent in 1994. Patient states 3 days ago he was digging leaves out of his ditch when he started developing Palpitations and SOB which progressed to chest pressure that radiated to his arms and shoulder. He stated that he becomes more winded and family has noted that he wasn't able to walk as far before getting SOB. Patient has had very mild leg swelling. Describes the pain/heaviness as a 9/10 in severity but has gone away now. Denied N/V or lightheadedness or dizziness. No diaphoresis. Workup in the ED showed patient was in A Fib with RVR. TRH was called to admit the patient. Patient was placed in SDU on Cardizem gtt hoever overnight he had a reuccernce of chest pressure and worsening dyspnea and given one dose of IV Lasix. Cardiology was consulted and Diltiazem gtt was increased and home metoprolol was started. He was also given 150 mg of IV Amiodarone in attempt to chemically cardiovert him. ECHOCardiogram was also done.   Assessment & Plan:   Principal Problem:   Atrial fibrillation with RVR (HCC) Active Problems:   AF (paroxysmal atrial fibrillation) (HCC)   Chest pain in adult   Dyspnea on exertion   Allergic rhinitis   GERD (gastroesophageal reflux disease)   BPH (benign prostatic hyperplasia)  Paroxysmal Atrial Fibrillation with RVR -Admitted to SDU; HR usually Rate Controlled per patient -Given 75 mg of po Metoprolol, IV 10 mg of Cardizem, and  placed on Cardizem gtt in ED -C/w Cardizem gtt and increased to 15 mg/hr and po Metoprolol restarted at 50 mg po BID per Cardiology recc's -Obtained ECHOCardiogram and showed an EF of 60-65% with Normal Wall Motion and Normal Left Ventricular Diastolic Function parameters.  -Anticoagulated with Xarelto 20 mg po qHS as CHADS2VASc was at least 3 for Age and HTN -Consulted Cardiology for additional Recc's and Evaluation and appreciate Dr. Court Joy Recc's -Check TSH, Free T4, Mag, and Phos -Given IV lasix yesterday as he became more dyspneic yesterday. BNP was elevated at 342. -TSH was 1.935 and Free T4 was 1.44; Electrolytes all WNL  Chest Pressure r/o ACS suspect rate related, improved when rate is better controlled -Likely from Uncontrolled Heart Rate; Patient complained of Chest Pressure and SOB along with not being able to ambulate without getting SOB -Has Hx of CAD s/p Stent in 1994 -Cycled Cardiac Troponin I x 3; First POC Troponin was 0.01 and next POC Troponin was 0.05; Troponins peaked at 0.17 -> 0.15 -> 0.13; Per Cardiology Troponin Elevation consistent with Demand Ischemia -Will Continue Metoprolol 50 mg po BID and Atorvastatin 10 mg po qHS -Checked ECHOCardiogram and showed EF of 60-65% and Normal Wall Motion and Normal Left Ventricular Diastol Function parameters.  -Hx of Abnormal Stress Test in 2016; Placed on Medical Therapy at that time. [facility] Cardiology for additional Recc's and Evaluation -Cardiology Recommending Outpatient Stress Testing   Dyspena on Exertion -Likely from Above -Checked ECHOcardiogram and showed EF of 60-65% with Normal Wall Motion and Normal  Left Ventricular Diastol Function parameters.  -Appreciate Cardiology Consultation for additional Recc's and Evaluation -Will need PT Evaluation prior to D/C  Seasonal Allergies/Allergic Rhinitis -C/w Loratadine 10 mg po Daily -Also Takes Fluticasone 50 mcg/Actuation Nasal Spar  GERD -C/w PPI Pharmacy  Protonix 40 mg po Substitution  Hyperlipidemia -Checked Lipid Panel and showed Cholesterol of 115, HDL of 33, LDL of 62, TG of 98, and VLDL of 22 -C/w Simvastatin 20 mgpo Daily  BPH; Hx of Prostate Cancer -C/w Tamsulosin 0.4 mg po Daily  Hyperglycemia -Check HbA1c (pending) -Per prior record from [place] showed patient was on Metformin 500 mg po Daily  DVT prophylaxis: Anticoagulated with Xarelto 20 mg po qHS Code Status: FULL CODE Family Communication: Discussed with wife at bedside Disposition Plan: Likely Home at D/C will need PT Evaluation.  Consultants:   Cardiology   Procedures: ECHOCARDIOGRAM Study Conclusions  - Procedure narrative: Transthoracic echocardiography. Image   quality was fair. The study was technically difficult, as a   result of poor sound wave transmission. Intravenous contrast   (Definity) was administered. - Left ventricle: The cavity size was normal. Wall thickness was   normal. Systolic function was normal. The estimated ejection   fraction was in the range of 60% to 65%. Wall motion was normal;   there were no regional wall motion abnormalities. Left   ventricular diastolic function parameters were normal. - Aortic valve: Mildly calcified leaflets. Morphologically, there   may be a mild degree of aortic valve stenosis. Valve area (VTI):   1.6 cm^2. Valve area (Vmax): 1.72 cm^2. Valve area (Vmean): 1.89   cm^2. - Mitral valve: Mildly calcified annulus. Mildly calcified anterior   leaflet tip. - Systemic veins: IVC was dilated with normal respiratory   variation. Estimated CVP 8 mmHg.    Antimicrobials:  Anti-infectives    None     Subjective: Seen and examined and stated he got extremely SOB last night and had a reoccurrence of his Chest Pressure. Felt a little better this AM but heart rate was still uncontrolled. No nausea or vomiting but felt weak. No other concerns or complaints at this time.   Objective: Vitals:   06/15/16  0900 06/15/16 1000 06/15/16 1100 06/15/16 1200  BP: (!) 127/58 114/69 116/67 130/67  Pulse: (!) 144 67 (!) 46 77  Resp: (!) 21 (!) 21 (!) 23 20  Temp:   98.6 F (37 C)   TempSrc:   Oral   SpO2: 95% 96% 97% 96%  Weight:      Height:        Intake/Output Summary (Last 24 hours) at 06/15/16 1708 Last data filed at 06/15/16 1300  Gross per 24 hour  Intake          1590.63 ml  Output             1375 ml  Net           215.63 ml   Filed Weights   06/14/16 1007 06/14/16 1700 06/15/16 0448  Weight: 93.4 kg (206 lb) 93.4 kg (205 lb 14.6 oz) 92.9 kg (204 lb 12.9 oz)   Examination: Physical Exam:  Constitutional: WN/WD, NAD Caucasian male who and appears calm  and younger than stated age Eyes: Lids and conjunctivae normal, sclerae anicteric  ENMT: External Ears, Nose appear normal. Grossly normal hearing. Mucous membranes are moist.  Neck: Appears normal, supple, no cervical masses, normal ROM, no appreciable thyromegaly, no appreciable JVD noted  Respiratory: Diminished but clear to auscultation bilaterally,  no wheezing, rales, rhonchi or crackles. Normal respiratory effort and patient is not tachypenic. No accessory muscle use.  Cardiovascular: Irregularly Irregular Rate and Tachycardic Rhythm, no murmurs / rubs / gallops. S1 and S2 auscultated. Trace lower extremity edema bilaterllay.  Abdomen: Soft, non-tender, non-distended. No masses palpated. No appreciable hepatosplenomegaly. Bowel sounds positive x4.  GU: Deferred. Musculoskeletal: No clubbing / cyanosis of digits/nails. No joint deformity upper and lower extremities. Good ROM, no contractures.  Skin: No rashes, lesions, ulcers on limited skin evaluation. No induration; Warm and dry.  Neurologic: CN 2-12 grossly intact with no focal deficits. Romberg sign cerebellar reflexes not assessed.  Psychiatric: Normal judgment and insight. Alert and oriented x 3. Normaland pleasant mood and appropriate affect.   Data Reviewed: I have  personally reviewed following labs and imaging studies  CBC:  Recent Labs Lab 06/14/16 1035 06/15/16 0501  WBC 9.8 11.0*  NEUTROABS 8.3* 9.0*  HGB 13.0 13.6  HCT 39.4 41.4  MCV 91.0 91.0  PLT 203 952   Basic Metabolic Panel:  Recent Labs Lab 06/14/16 1035 06/15/16 0501  NA 135 136  K 4.4 3.7  CL 101 98*  CO2 27 27  GLUCOSE 165* 142*  BUN 16 15  CREATININE 1.08 0.99  CALCIUM 8.9 8.7*  MG  --  1.7  PHOS  --  2.9   GFR: Estimated Creatinine Clearance: 61.4 mL/min (by C-G formula based on SCr of 0.99 mg/dL). Liver Function Tests:  Recent Labs Lab 06/14/16 1035 06/15/16 0501  AST 23 21  ALT 17 15*  ALKPHOS 42 47  BILITOT 0.6 1.2  PROT 7.1 7.6  ALBUMIN 3.9 4.1   No results for input(s): LIPASE, AMYLASE in the last 168 hours. No results for input(s): AMMONIA in the last 168 hours. Coagulation Profile: No results for input(s): INR, PROTIME in the last 168 hours. Cardiac Enzymes:  Recent Labs Lab 06/14/16 1752 06/14/16 2325 06/15/16 0501  TROPONINI 0.17* 0.15* 0.13*   BNP (last 3 results) No results for input(s): PROBNP in the last 8760 hours. HbA1C: No results for input(s): HGBA1C in the last 72 hours. CBG: No results for input(s): GLUCAP in the last 168 hours. Lipid Profile:  Recent Labs  06/14/16 1752 06/15/16 0501  CHOL 110 115  HDL 31* 33*  LDLCALC 57 62  TRIG 108 98  CHOLHDL 3.5 3.5   Thyroid Function Tests:  Recent Labs  06/14/16 1752  TSH 1.935  FREET4 1.44*   Anemia Panel: No results for input(s): VITAMINB12, FOLATE, FERRITIN, TIBC, IRON, RETICCTPCT in the last 72 hours. Sepsis Labs: No results for input(s): PROCALCITON, LATICACIDVEN in the last 168 hours.  Recent Results (from the past 240 hour(s))  MRSA PCR Screening     Status: None   Collection Time: 06/14/16  7:36 PM  Result Value Ref Range Status   MRSA by PCR NEGATIVE NEGATIVE Final    Comment:        The GeneXpert MRSA Assay (FDA approved for NASAL  specimens only), is one component of a comprehensive MRSA colonization surveillance program. It is not intended to diagnose MRSA infection nor to guide or monitor treatment for MRSA infections.     Radiology Studies: Dg Chest Port 1 View  Result Date: 06/14/2016 CLINICAL DATA:  Anterior chest pain for 3 days. EXAM: PORTABLE CHEST 1 VIEW COMPARISON:  07/29/2014. FINDINGS: 1022 hour. Lordotic positioning. The heart size and mediastinal contours are normal. The lungs are clear. There is no pleural effusion or pneumothorax. No acute osseous  findings are identified. Telemetry leads overlie the chest. IMPRESSION: Stable chest.  No active cardiopulmonary process. Electronically Signed   By: Richardean Sale M.D.   On: 06/14/2016 10:37   Scheduled Meds: . atorvastatin  10 mg Oral q1800  . loratadine  10 mg Oral Daily  . metoprolol tartrate  50 mg Oral BID  . pantoprazole  40 mg Oral Daily  . rivaroxaban  20 mg Oral Q supper  . tamsulosin  0.4 mg Oral Daily   Continuous Infusions: . diltiazem (CARDIZEM) infusion 15 mg/hr (06/15/16 1101)    LOS: 0 days    Kerney Elbe, DO [facility] Pager [phone]  If 7PM-7AM, please contact night-coverage [email or web address] Password [email or web address] 06/15/2016, 5:08 PM

## 2016-06-15 NOTE — Progress Notes (Signed)
Patient called out to use the urinal. Upon finishing, he stated to nurse that he felt like he couldn't breathe and needed a breathing treatment. HR increased to 140's-150's. Pt HR all night had been 90's-low 100's. Patient also stated he felt a sense of heaviness or pressure in the center of chest. RN paged MD and was given an order to stop IV fluids, Give 40mg  of Lasix and a PRN breathing treatment. RN also increased Cardizem gtt to 10 and asked Respiratory to obtain another EKG.  Will continue to monitor  Margaret Pyle, RN

## 2016-06-15 NOTE — Progress Notes (Signed)
*  PRELIMINARY RESULTS* Echocardiogram 2D Echocardiogram has been performed.  Jesse Murphy 06/15/2016, 3:32 PM

## 2016-06-15 NOTE — Consult Note (Signed)
CARDIOLOGY CONSULT NOTE   Patient ID: Jesse Murphy MRN: [id number] DOB/AGE: 1928/03/14 83 y.o.  Admit Date: 06/14/2016 Referring Physician: Alfredia Ferguson, MD Primary Physician: Deloria Lair, MD Consulting Cardiologist: Kate Sable MD Primary Cardiologist: Formerly, Photographer [facility]).  Reason for Consultation: Chest pain &  Afib with RVR  Clinical Summary Jesse Murphy is a 83 y.o. male who is being seen today for the evaluation of chest pain, with Afib RVR, at the request of Dr. Alfredia Ferguson, [facility]. The patient came to ER with after awakening with complaints of substernal chest pressure, radiating to his left shoulder and left arm, with associated dyspnea. He states he felt his heart racing. He is normally followed by Dr. Hamilton Capri, but has not been seen by cardiology for 1 1/2 years. He has a history of atrial fib on Xarelto, CAD with stent to unknown artery in 1992, diabetes, hypercholesterolemia, and hypertension.   He reports change in his energy level and more fatigue starting last Sat after removing leaves that had accumulated in ditch. He used a pitchfork to lift the leaves and place them in to a truck and then removed them from the truck in the woods. He states he felt some chest discomfort 3 days late, in the center of his chest but it went away on its own. He was awakened yesterday morning with severe chest pain, dyspnea, and some diaphoresis. He states he felt his heart racing, and came to ER. He has been medically compliant. Medications have been provided by  PCP, Dr. Scotty Court.   On arrival to ER he was found to be in atrial fib with RVR, HR 117 bpm per EKG, with PVC;s, some ST depression noted in lateral leads,, but triage vital signs had his HR at 147 bpm. BP was stable at 122/96, afebrile. CXR negative for CHF or pneumonia. Initial troponin 0.05, with subsequent troponin 0.17; 0.15 and 0.13 respectively. Pertinent labs include glucose of 165, creatinine 1.08. No evidence of anemia. BNP  346. He was started on IV diltiazem after bolus, with metoprolol 75 mg po. He is now on diltiazem gtt at 10 mg hour. He was given IV fluid bolus in ER.   Last evening he began to have recurrence of chest pressure and worsening dyspnea. He was given one dose of IV lasix with diureses of 1.7 liters and resolution of symptoms.  Heart rate this am is labile and not well controlled, rates in the 120's to 160's during assessment.    No Known Allergies  Medications Scheduled Medications: . atorvastatin  10 mg Oral q1800  . loratadine  10 mg Oral Daily  . pantoprazole  40 mg Oral Daily  . rivaroxaban  20 mg Oral Q supper  . tamsulosin  0.4 mg Oral Daily    Infusions: . diltiazem (CARDIZEM) infusion 10 mg/hr (06/15/16 0600)    PRN Medications: acetaminophen **OR** acetaminophen, ipratropium-albuterol, ondansetron **OR** ondansetron (ZOFRAN) IV, senna-docusate, sodium chloride   Past Medical History:  Diagnosis Date  . Atrial fibrillation ([place])   . Cancer [facility])    prostate  . Hypertension     Past Surgical History:  Procedure Laterality Date  . HERNIA REPAIR      Family History  Problem Relation Age of Onset  . CAD Mother 42  . CAD Father 75  . CAD Sister   . CAD Brother      Social History Mr. Klingbeil reports that he quit smoking about 26 years ago. His smoking use included Cigarettes. He has  never used smokeless tobacco. Mr. Gell reports that he does not drink alcohol.  Review of Systems Complete review of systems are found to be negative unless outlined in H&P above.  Physical Examination Blood pressure 131/85, pulse (!) 133, temperature 98.6 F (37 C), temperature source Oral, resp. rate (!) 24, height 5\' 9"  (1.753 m), weight 204 lb 12.9 oz (92.9 kg), SpO2 95 %.  Intake/Output Summary (Last 24 hours) at 06/15/16 0847 Last data filed at 06/15/16 0800  Gross per 24 hour  Intake          1321.49 ml  Output             1375 ml  Net           -53.51 ml    Telemetry:  Atrial fib with PVC's rates elevated in the 120's to 160;s at rest.   GEN: Complaining of mild chest pressure.  HEENT: Conjunctiva and lids normal, oropharynx clear with moist mucosa. Neck: Supple, no elevated JVP or carotid bruits, no thyromegaly. Lungs: Clear to auscultation, nonlabored breathing at rest. Cardiac: Irregular rate and rhythm,tachycardic,  no S3 or significant systolic murmur, no pericardial rub. Abdomen: Soft, nontender, no hepatomegaly, bowel sounds present, no guarding or rebound. Extremities: No pitting edema, distal pulses 2+. Skin: Warm and dry. Musculoskeletal: No kyphosis. Neuropsychiatric: Alert and oriented x3, affect grossly appropriate.  Prior Cardiac Testing/Procedures Reported abnormal stress test in 2016 that was treated medically.   Lab Results  Basic Metabolic Panel:  Recent Labs Lab 06/14/16 1035 06/15/16 0501  NA 135 136  K 4.4 3.7  CL 101 98*  CO2 27 27  GLUCOSE 165* 142*  BUN 16 15  CREATININE 1.08 0.99  CALCIUM 8.9 8.7*  MG  --  1.7  PHOS  --  2.9    Liver Function Tests:  Recent Labs Lab 06/14/16 1035 06/15/16 0501  AST 23 21  ALT 17 15*  ALKPHOS 42 47  BILITOT 0.6 1.2  PROT 7.1 7.6  ALBUMIN 3.9 4.1    CBC:  Recent Labs Lab 06/14/16 1035 06/15/16 0501  WBC 9.8 11.0*  NEUTROABS 8.3* 9.0*  HGB 13.0 13.6  HCT 39.4 41.4  MCV 91.0 91.0  PLT 203 196    Cardiac Enzymes:  Recent Labs Lab 06/14/16 1752 06/14/16 2325 06/15/16 0501  TROPONINI 0.17* 0.15* 0.13*    Radiology: Dg Chest Port 1 View  Result Date: 06/14/2016 CLINICAL DATA:  Anterior chest pain for 3 days. EXAM: PORTABLE CHEST 1 VIEW COMPARISON:  07/29/2014. FINDINGS: 1022 hour. Lordotic positioning. The heart size and mediastinal contours are normal. The lungs are clear. There is no pleural effusion or pneumothorax. No acute osseous findings are identified. Telemetry leads overlie the chest. IMPRESSION: Stable chest.  No active cardiopulmonary process.  Electronically Signed   By: Richardean Sale M.D.   On: 06/14/2016 10:37     ECG: Atrial fib with rate of 117 bpm, with ST depression and PVC;s.    Impression and Recommendations  1. Atrial fib with RVR:  Now on diltiazem gtt at 10 mg hour, and po metoprolol. His rate is not well controlled currently. Will increase diltiazem to 15 mg/hour, start back metoprolol at 50 mg BID. Watch BP. Once his heart rate improves will have echocardiogram completed for evaluation of LV fx. Care Everywhere documents echocardiogram in 2016, but no available reports are seen. If HR is not well controlled, or he develops hypotension, will begin amiodarone. Continue Xarelto.  2. CAD: Hx of stent  to unknown artery, in 1992 at [facility]. Last office note by Dr. Hamilton Capri on 08/26/2014 reports "Lexiscan cardiolite suggest multi vessel CAD. For now, I will decrease metformin and lopressor to see if this has effect on diarrhea. Will revisit in 2 weeks. I am recommending cor angio elective when diarrhea resolves" . This may need to be revisited depending on his echo. Will discuss with Dr. Bronson Ing need to repeat stress test vs transfer for cath once more information is available. Troponin is elevated suspect demand ischemia.   3. Hypercholesterolemia: Continue statin therapy.      Signed: Phill Myron. Lawrence NP [place]  06/15/2016, 8:47 AM Co-Sign MD  The patient was seen and examined, and I agree with the history, physical exam, assessment and plan as documented above, with modifications as noted below. I have also personally reviewed all relevant documentation, old records, labs, and both radiographic and cardiovascular studies. I have also independently interpreted old and new ECG's. 83 yr old male (who appears much younger than stated age) with h/o paroxysmal atrial fibrillation and CAD (angioplasty at [facility] in 1992) with aforementioned presentation admitted with rapid atrial fibrillation.   HR's currently elevated on  diltiazem 10 mg/hr. He has not missed a dose of Xarelto. Currently with mild chest pressure and much less short of breath.  Agree with increasing diltiazem to 15 mg/hr with concomitant oral metoprolol. I will give one dose of 150 mg IV amiodarone. I would consider loading him in an attempt to chemically cardiovert him. He is not in decompensated heart failure. Minimal troponin elevation consistent with demand ischemia. Continue statin. I would consider outpatient stress testing. Echocardiogram to be obtained only when HR is controlled so as more accurately assess LV systolic function and regional wall motion.  Kate Sable, MD, [facility]  06/15/2016 9:27 AM

## 2016-06-15 NOTE — Progress Notes (Signed)
Per patient chest pressure has eased up. MD ordered 20mg  Bolus of Cardizem due to pt HR still 130's-140's. Pt BP is stable. Placed on 2L [place] for comfort.  30 mins after Bolus Pt HR ranging low 100's-120's. He is resting  Will continue to monitor  Margaret Pyle, RN

## 2016-06-15 NOTE — Care Management Note (Addendum)
Case Management Note  Patient Details  Name: Jesse Murphy MRN: [id number] Date of Birth: 04-26-1928  Subjective/Objective: 83 yo male, from home, ind PTA. Adm with Afib with RVR. Taking Xarelto PTA. No DME or HH PTA.                 Action/Plan: Plans to return home with self care. Will follow for needs.    Expected Discharge Date:  06/15/16               Expected Discharge Plan:  Home/Self Care  In-House Referral:     Discharge planning Services  CM Consult  Post Acute Care Choice:    Choice offered to:     DME Arranged:    DME Agency:     HH Arranged:    HH Agency:     Status of Service:  In process, will continue to follow  If discussed at Long Length of Stay Meetings, dates discussed:    Additional Comments:  Maleah Rabago, Chauncey Reading, RN 06/15/2016, 2:51 PM

## 2016-06-15 NOTE — Care Management Obs Status (Signed)
[place] NOTIFICATION   Patient Details  Name: ELIBERTO SOLE MRN: [id number] Date of Birth: 1928/10/21   Medicare Observation Status Notification Given:  Yes    Blane Worthington, Chauncey Reading, RN 06/15/2016, 2:53 PM

## 2016-06-15 NOTE — Plan of Care (Signed)
Problem: Safety: Goal: Ability to remain free from injury will improve Outcome: Progressing Patient's bed in lowest position, Call light and belongings within reach. Urinal at bedside and bed alarm on. Patient educated about importance of calling for assistance when needing to get up or needing an item that is not in the room.  Problem: Health Behavior/Discharge Planning: Goal: Ability to manage health-related needs will improve Outcome: Progressing Patient is compliant and understanding of treatment and medications. Patient is able to complete most tasks on his own.  Problem: Pain Managment: Goal: General experience of comfort will improve Outcome: Progressing Patient has been free of pain.  Problem: Skin Integrity: Goal: Risk for impaired skin integrity will decrease Outcome: Progressing Patient does not have any skin issues to this point. Patient able to turn self.

## 2016-06-16 LAB — MAGNESIUM: Magnesium: 1.9 mg/dL (ref 1.7–2.4)

## 2016-06-16 LAB — COMPREHENSIVE METABOLIC PANEL
ALT: 13 U/L — ABNORMAL LOW (ref 17–63)
AST: 19 U/L (ref 15–41)
Albumin: 3.5 g/dL (ref 3.5–5.0)
Alkaline Phosphatase: 40 U/L (ref 38–126)
Anion gap: 8 (ref 5–15)
BUN: 15 mg/dL (ref 6–20)
CO2: 27 mmol/L (ref 22–32)
Calcium: 8.5 mg/dL — ABNORMAL LOW (ref 8.9–10.3)
Chloride: 99 mmol/L — ABNORMAL LOW (ref 101–111)
Creatinine, Ser: 0.95 mg/dL (ref 0.61–1.24)
GFR calc Af Amer: >60 mL/min (ref >60)
GFR calc non Af Amer: >60 mL/min (ref >60)
Glucose, Bld: 133 mg/dL — ABNORMAL HIGH (ref 65–99)
Potassium: 3.7 mmol/L (ref 3.5–5.1)
Sodium: 134 mmol/L — ABNORMAL LOW (ref 135–145)
Total Bilirubin: 1.1 mg/dL (ref 0.3–1.2)
Total Protein: 6.7 g/dL (ref 6.5–8.1)

## 2016-06-16 LAB — CBC WITH DIFFERENTIAL/PLATELET
Basophils Absolute: 0 10*3/uL (ref 0.0–0.1)
Basophils Relative: 0 %
Eosinophils Absolute: 0.2 10*3/uL (ref 0.0–0.7)
Eosinophils Relative: 2 %
HCT: 36.3 % — ABNORMAL LOW (ref 39.0–52.0)
Hemoglobin: 11.8 g/dL — ABNORMAL LOW (ref 13.0–17.0)
Lymphocytes Relative: 13 %
Lymphs Abs: 1 10*3/uL (ref 0.7–4.0)
MCH: 29.4 pg (ref 26.0–34.0)
MCHC: 32.5 g/dL (ref 30.0–36.0)
MCV: 90.5 fL (ref 78.0–100.0)
Monocytes Absolute: 1.1 10*3/uL — ABNORMAL HIGH (ref 0.1–1.0)
Monocytes Relative: 14 %
Neutro Abs: 5.7 10*3/uL (ref 1.7–7.7)
Neutrophils Relative %: 71 %
Platelets: 190 10*3/uL (ref 150–400)
RBC: 4.01 MIL/uL — ABNORMAL LOW (ref 4.22–5.81)
RDW: 14.5 % (ref 11.5–15.5)
WBC: 8 10*3/uL (ref 4.0–10.5)

## 2016-06-16 LAB — GLUCOSE, CAPILLARY: Glucose-Capillary: 116 mg/dL — ABNORMAL HIGH (ref 65–99)

## 2016-06-16 LAB — PHOSPHORUS: Phosphorus: 3.3 mg/dL (ref 2.5–4.6)

## 2016-06-16 LAB — HEMOGLOBIN A1C
Hgb A1c MFr Bld: 6.3 % — ABNORMAL HIGH (ref 4.8–5.6)
Mean Plasma Glucose: 134 mg/dL

## 2016-06-16 MED ORDER — AMIODARONE HCL 200 MG PO TABS
200.0000 mg | ORAL_TABLET | Freq: Every day | ORAL | Status: DC
  Administered 2016-06-16 – 2016-06-17 (×2): 200 mg via ORAL
  Filled 2016-06-16 (×2): qty 1

## 2016-06-16 MED ORDER — DILTIAZEM HCL 30 MG PO TABS
30.0000 mg | ORAL_TABLET | Freq: Four times a day (QID) | ORAL | Status: DC
  Administered 2016-06-16 – 2016-06-17 (×5): 30 mg via ORAL
  Filled 2016-06-16 (×5): qty 1

## 2016-06-16 NOTE — Progress Notes (Signed)
Progress Note  Patient Name: Jesse Murphy Date of Encounter: 06/16/2016  Primary Cardiologist: Dr. Lavenia Atlas, now Dr. Bronson Ing  Subjective   Feeling better. No complaints.  Inpatient Medications    Scheduled Meds: . atorvastatin  10 mg Oral q1800  . loratadine  10 mg Oral Daily  . metoprolol tartrate  50 mg Oral BID  . pantoprazole  40 mg Oral Daily  . rivaroxaban  20 mg Oral Q supper  . tamsulosin  0.4 mg Oral Daily   Continuous Infusions: . diltiazem (CARDIZEM) infusion 5 mg/hr (06/15/16 2111)   PRN Meds: acetaminophen **OR** acetaminophen, ipratropium-albuterol, ondansetron **OR** ondansetron (ZOFRAN) IV, senna-docusate, sodium chloride   Vital Signs    Vitals:   06/16/16 0700 06/16/16 0736 06/16/16 0800 06/16/16 0900  BP: (!) 132/56  (!) 124/57 (!) 126/56  Pulse: 73 69 71 73  Resp: 19 18 14  (!) 21  Temp:  97.8 F (36.6 C)    TempSrc:  Oral    SpO2: 95% 95% 96% 97%  Weight:      Height:        Intake/Output Summary (Last 24 hours) at 06/16/16 0906 Last data filed at 06/16/16 0800  Gross per 24 hour  Intake          1382.89 ml  Output             1375 ml  Net             7.89 ml   Filed Weights   06/14/16 1700 06/15/16 0448 06/16/16 0431  Weight: 205 lb 14.6 oz (93.4 kg) 204 lb 12.9 oz (92.9 kg) 205 lb 11 oz (93.3 kg)    Telemetry    NSR with 7 beats WCT last night.- Personally Reviewed  ECG    Afib with RVR and some ST depression - Personally Reviewed  Physical Exam    GEN: Still looks short of breath at rest.   Neck: Increased JVD Cardiac: RRR, 1/6 systolic murmur LSB  Respiratory: Decreased breath sounds with bibasilar crackles GI: Soft, nontender, non-distended  MS: No edema; No deformity. Neuro:  Nonfocal  Psych: Normal affect   Labs    Chemistry Recent Labs Lab 06/14/16 1035 06/15/16 0501 06/16/16 0419  NA 135 136 134*  K 4.4 3.7 3.7  CL 101 98* 99*  CO2 27 27 27   GLUCOSE 165* 142* 133*  BUN 16 15 15   CREATININE  1.08 0.99 0.95  CALCIUM 8.9 8.7* 8.5*  PROT 7.1 7.6 6.7  ALBUMIN 3.9 4.1 3.5  AST 23 21 19   ALT 17 15* 13*  ALKPHOS 42 47 40  BILITOT 0.6 1.2 1.1  GFRNONAA >60 >60 >60  GFRAA >60 >60 >60  ANIONGAP 7 11 8      Hematology Recent Labs Lab 06/14/16 1035 06/15/16 0501 06/16/16 0419  WBC 9.8 11.0* 8.0  RBC 4.33 4.55 4.01*  HGB 13.0 13.6 11.8*  HCT 39.4 41.4 36.3*  MCV 91.0 91.0 90.5  MCH 30.0 29.9 29.4  MCHC 33.0 32.9 32.5  RDW 14.1 14.4 14.5  PLT 203 196 190    Cardiac Enzymes Recent Labs Lab 06/14/16 1752 06/14/16 2325 06/15/16 0501  TROPONINI 0.17* 0.15* 0.13*    Recent Labs Lab 06/14/16 1050 06/14/16 1342  TROPIPOC 0.01 0.05     BNP Recent Labs Lab 06/14/16 1752  BNP 342.0*     DDimer No results for input(s): DDIMER in the last 168 hours.   Radiology    Dg Chest [place] 1  View  Result Date: 06/14/2016 CLINICAL DATA:  Anterior chest pain for 3 days. EXAM: PORTABLE CHEST 1 VIEW COMPARISON:  07/29/2014. FINDINGS: 1022 hour. Lordotic positioning. The heart size and mediastinal contours are normal. The lungs are clear. There is no pleural effusion or pneumothorax. No acute osseous findings are identified. Telemetry leads overlie the chest. IMPRESSION: Stable chest.  No active cardiopulmonary process. Electronically Signed   By: Richardean Sale M.D.   On: 06/14/2016 10:37    Cardiac Studies   2Decho 06/15/16 Study Conclusions   - Procedure narrative: Transthoracic echocardiography. Image   quality was fair. The study was technically difficult, as a   result of poor sound wave transmission. Intravenous contrast   (Definity) was administered. - Left ventricle: The cavity size was normal. Wall thickness was   normal. Systolic function was normal. The estimated ejection   fraction was in the range of 60% to 65%. Wall motion was normal;   there were no regional wall motion abnormalities. Left   ventricular diastolic function parameters were normal. - Aortic  valve: Mildly calcified leaflets. Morphologically, there   may be a mild degree of aortic valve stenosis. Valve area (VTI):   1.6 cm^2. Valve area (Vmax): 1.72 cm^2. Valve area (Vmean): 1.89   cm^2. - Mitral valve: Mildly calcified annulus. Mildly calcified anterior   leaflet tip. - Systemic veins: IVC was dilated with normal respiratory   variation. Estimated CVP 8 mmHg.   Patient Profile     83 y.o. male admitted with chest pain, dyspnea and Afib with RVR. Hx CAD stent 1992, Afib on Xarelto. Responded initially to IV Lasix  Assessment & Plan     1. Atrial fib with RVR:  Converted to NSR Now on diltiazem gtt at 10 mg hour, Amio 150 mg and po metoprolol 50 mg BID. Will convert to oral diltiazem 30 mg q 6.? Need for oral Amio? Echo with normal LV function LVEF 60-65%, no WMA, normal diastolic function, mild AS, normal LA. Continue Xarelto.   2. CAD: Hx of stent to unknown artery, in 1992 at [facility]. Last office note by Dr. Hamilton Capri on 08/26/2014 reports "Lexiscan cardiolite suggest multi vessel CAD.  Troponins elevated but flat. Will need outpatient myoview.   3. Hypercholesterolemia: Continue statin therapy.     4.COPD? exacerbation    Signed, Ermalinda Barrios, PA-C  06/16/2016, 9:06 AM    The patient was seen and examined, and I agree with the history, physical exam, assessment and plan as documented above, with modifications as noted below. Patient chemically cardioverted to sinus rhythm after 150 mg IV amiodarone in addition to diltiazem infusion yesterday. Had 8-beat run of NSVT last night. K and Mg within normal limits. Denies chest pain and shortness of breath. He is now on oral metoprolol and diltiazem. TSH and LFT's were normal. Given advanced age, I would prefer not maintain on long term amiodarone. However, will start 200 mg daily for now and monitor in outpatient setting and consider discontinuation at some point   Kate Sable, MD, [facility]  06/16/2016 9:33 AM

## 2016-06-16 NOTE — Evaluation (Signed)
Physical Therapy Evaluation Patient Details Name: Jesse Murphy MRN: [id number] DOB: 1928/12/30 Today's Date: 06/16/2016   History of Present Illness  83 y.o. male with medical history significant of HTN, HLD, ?DM (was previously on Metformin), BPH, Seasonal Allergies/Allergic Rhinitis, GERD, Paroxysmal Atrial Fibrillation on Anticoagulation with Xarelto who presented to [facility] ED with a cc of Chest Pressure that radiated into his Left arm and shoulder, Palpations and Shortness of Breath worsening while ambulating. Patient has a Hx of CAD and is s/p a stent in 1994. Patient states 3 days ago he was digging leaves out of his ditch when he started developing Palpitations and SOB which progressed to chest pressure that radiated to his arms and shoulder. He stated that he becomes more winded and family has noted that he wasn't able to walk as far before getting SOB. Patient has had very mild leg swelling. Describes the pain/heaviness as a 9/10 in severity but has gone away now. Denied N/V or lightheadedness or dizziness. No diaphoresis. Workup in the ED showed patient was in A Fib with RVR.   Chemically cardioverted to sinus rhythm, but had an 8 beat run of NSVT last night  Clinical Impression  Pt received sitting up in the chair, and was agreeable to PT evaluation.  Pt is normally independent with ambulation, ADL's, and IADL's.  He helps care for his wife and is in charge of the cooking at home.  During PT evaluation, he stood independently, however c/o lightheaded feeling.  He ambulated 237ft, independently, however it was slightly guarded, and slower than his normal gait speed.  Although pt is not 100% back to his baseline level of mobility, he does not demonstrate need for skilled PT to achieve this.  Encouraged pt to stay active once he returns home.  PT will sign off at this time.     Follow Up Recommendations No PT follow up    Equipment Recommendations  None recommended by PT     Recommendations for Other Services       Precautions / Restrictions Precautions Precautions: None Restrictions Weight Bearing Restrictions: No      Mobility  Bed Mobility               General bed mobility comments: Not assessed - pt already sitting in the chair upon arrival.   Transfers Overall transfer level: Modified independent Equipment used: None             General transfer comment: Pt c/o lightheadedness upon standing, BP: 115/57, HR: 81bpm  Ambulation/Gait Ambulation/Gait assistance: Independent Ambulation Distance (Feet): 200 Feet Assistive device: None Gait Pattern/deviations: WFL(Within Functional Limits)     General Gait Details: pt is noted to be slightly guarded with reduced gait speed compared to his normal.    Stairs            Wheelchair Mobility    Modified Rankin (Stroke Patients Only)       Balance Overall balance assessment: Independent                                           Pertinent Vitals/Pain Pain Assessment: No/denies pain    Home Living   Living Arrangements: Spouse/significant other Available Help at Discharge: Available 24 hours/day Type of Home: House Home Access: Stairs to enter   [employer] of Steps: 4-5 Home Layout: One level Home Equipment: Other (comment) (  lift chair)      Prior Function Level of Independence: Independent               Hand Dominance        Extremity/Trunk Assessment   Upper Extremity Assessment Upper Extremity Assessment: Overall WFL for tasks assessed    Lower Extremity Assessment Lower Extremity Assessment: Overall WFL for tasks assessed       Communication   Communication: No difficulties  Cognition Arousal/Alertness: Awake/alert Behavior During Therapy: WFL for tasks assessed/performed Overall Cognitive Status: Within Functional Limits for tasks assessed                                        General  Comments      Exercises     Assessment/Plan    PT Assessment Patent does not need any further PT services  PT Problem List         PT Treatment Interventions      PT Goals (Current goals can be found in the Care Plan section)  Acute Rehab PT Goals Patient Stated Goal: Pt wants to go home PT Goal Formulation: All assessment and education complete, DC therapy    Frequency     Barriers to discharge        Co-evaluation               AM-PAC PT "6 Clicks" Daily Activity  Outcome Measure Difficulty turning over in bed (including adjusting bedclothes, sheets and blankets)?: None Difficulty moving from lying on back to sitting on the side of the bed? : None Difficulty sitting down on and standing up from a chair with arms (e.g., wheelchair, bedside commode, etc,.)?: None Help needed moving to and from a bed to chair (including a wheelchair)?: None Help needed walking in hospital room?: None Help needed climbing 3-5 steps with a railing? : None 6 Click Score: 24    End of Session Equipment Utilized During Treatment: Gait belt Activity Tolerance: Patient tolerated treatment well Patient left: in chair;with call bell/phone within reach;with family/visitor present Nurse Communication: Mobility status PT Visit Diagnosis: Difficulty in walking, not elsewhere classified (R26.2);Muscle weakness (generalized) (M62.81)    Time: 6010-9323 PT Time Calculation (min) (ACUTE ONLY): 14 min   Charges:   PT Evaluation $PT Eval Low Complexity: 1 Procedure     PT G Codes:   PT G-Codes **NOT FOR INPATIENT CLASS** Functional Assessment Tool Used: AM-PAC 6 Clicks Basic Mobility;Clinical judgement Functional Limitation: Mobility: Walking and moving around Mobility: Walking and Moving Around Current Status (F5732): 0 percent impaired, limited or restricted Mobility: Walking and Moving Around Goal Status (K0254): 0 percent impaired, limited or restricted Mobility: Walking and Moving  Around Discharge Status (Y7062): 0 percent impaired, limited or restricted    Jesse Murphy, PT, DPT X: [id number]

## 2016-06-16 NOTE — Progress Notes (Signed)
PROGRESS NOTE    Jesse Murphy  YKD:[id number] DOB: 11/28/1928 DOA: 06/14/2016 PCP: Deloria Lair, MD    Brief Narrative:  Jesse Murphy a 83 y.o.malewith medical history significant of HTN, HLD, ?DM (was previously on Metformin), BPH, Seasonal Allergies/Allergic Rhinitis, GERD, Paroxysmal Atrial Fibrillation on Anticoagulation with Xarelto who presented to [facility] ED with a cc of Chest Pressure that radiated into his Left arm and shoulder, Palpations and Shortness of Breath worsening while ambulating. Patient has a Hx of CAD and is s/p a stent in 1994. Patient states 3 days ago he was digging leaves out of his ditch when he started developing Palpitations and SOB which progressed to chest pressure that radiated to his arms and shoulder. He stated that he becomes more winded and family has noted that he wasn't able to walk as far before getting SOB. Patient has had very mild leg swelling. Describes the pain/heaviness as a 9/10 in severity but has gone away now. Denied N/V or lightheadedness or dizziness. No diaphoresis. Workup in the ED showed patient was in A Fib with RVR. TRH was called to admit the patient. Patient was placed in SDU on Cardizem gtt hoever overnight he had a reuccernce of chest pressure and worsening dyspnea and given one dose of IV Lasix. Cardiology was consulted and Diltiazem gtt was increased and home metoprolol was started. He was also given 150 mg of IV Amiodarone in attempt to chemically cardiovert him. ECHOCardiogram was also done.    Assessment & Plan:   Principal Problem:   Atrial fibrillation with RVR (HCC) Active Problems:   AF (paroxysmal atrial fibrillation) (HCC)   Chest pain in adult   Dyspnea on exertion   Allergic rhinitis   GERD (gastroesophageal reflux disease)   BPH (benign prostatic hyperplasia)   Paroxysmal Atrial Fibrillation with RVR -Given 75 mg of po Metoprolol, IV 10 mg of Cardizem, and placed on Cardizem gtt in ED - converted to NSR  with 150mg  IV amiodarone + diltiazem gtt yesterday - ECHOCardiogram: EF of 60-65% with Normal Wall Motion and Normal Left Ventricular Diastolic Function parameters.  -Anticoagulated with Xarelto 20 mg po qHS as CHADS2VASc was at least 3 for Age and HTN -Consulted Cardiology for additional Recc's and Evaluation and appreciate Dr. Court Joy Recc's - TSH 1.935, Free T4 of 1.44, Mag 69f 1.9, and Phos 3.3 - per cardiology note will start PO amiodarone today  Chest Pressure r/o ACS suspect rate related, improved when rate is better controlled -Has Hx of CAD s/p Stent in 1994 -Cycled Cardiac Troponin I x 3; First POC Troponin was 0.01 and next POC Troponin was 0.05; Troponins peaked at 0.17 -> 0.15 -> 0.13; Per Cardiology Troponin Elevation consistent with Demand Ischemia -Will Continue Metoprolol 50 mg po BID and Atorvastatin 10 mg po qHS - ECHOCardiogram and showed EF of 60-65% and Normal Wall Motion and Normal Left Ventricular Diastol Function parameters.  -Hx of Abnormal Stress Test in 2016; Placed on Medical Therapy at that time. - appreciate cardiology recommendations  [place] Cardiology Consultation for additional Recc's and Evaluation -Will need PT Evaluation prior to D/C - possibly from uncontrolled A. fib  Seasonal Allergies/Allergic Rhinitis -C/w Loratadine 10 mg po Daily -Also Takes Fluticasone 50 mcg/Actuation Nasal Spar  GERD -C/w PPI Pharmacy Protonix 40 mg po Substitution  Hyperlipidemia - Lipid Panel and showed Cholesterol of 115, HDL of 33, LDL of 62, TG of 98, and VLDL of 22 -C/w Simvastatin 20 mgpo Daily  BPH;  Hx of Prostate Cancer -C/w Tamsulosin 0.4 mg po Daily  Hyperglycemia - HbA1c 6.3 - restart Metformin 500 mg po Daily  DVT prophylaxis: Anticoagulated with Xarelto 20 mg po qHS Code Status: FULL CODE Family Communication: Discussed with wife at bedside Disposition Plan: Likely Home when heart rate controlled but will need PT  Evaluation.    Consultants:   Cardiology  Procedures:  Echo: Transthoracic echocardiography. Imagequality was fair. The study was technically difficult, as aresult of poor sound wave transmission. Intravenous contrast(Definity) was administered. Left ventricle: The cavity size was normal. Wall thickness wasnormal. Systolic function was normal. The estimated ejectionfraction was in the range of 60% to 65%. Wall motion was normal;there were no regional wall motion abnormalities. Leftventricular diastolic function parameters were normal. Aortic valve: Mildly calcified leaflets. Morphologically, theremay be a mild degree of aortic valve stenosis. Valve area (VTI):1.6 cm^2. Valve area (Vmax): 1.72 cm^2. Valve area (Vmean): 1.89cm^2. Mitral valve: Mildly calcified annulus. Mildly calcified anteriorleaflet tip. Systemic veins: IVC was dilated with normal respiratoryvariation. Estimated CVP 8 mmHg.  Antimicrobials:   None    Subjective: Patient awake and states today he feels well.  Wondering when he could possibly get out of ICU.  Slept well.  Denies any chest pain, chest pressure, shortness of breath.  Slept well last night.  Objective: Vitals:   06/16/16 0431 06/16/16 0500 06/16/16 0600 06/16/16 0736  BP:  (!) 128/58 (!) 122/59   Pulse:  65 72 69  Resp:  17 19 18   Temp: 97.9 F (36.6 C)   97.8 F (36.6 C)  TempSrc: Oral   Oral  SpO2:  96% 95% 95%  Weight: 93.3 kg (205 lb 11 oz)     Height:        Intake/Output Summary (Last 24 hours) at 06/16/16 0816 Last data filed at 06/16/16 0736  Gross per 24 hour  Intake          1372.89 ml  Output             1375 ml  Net            -2.11 ml   Filed Weights   06/14/16 1700 06/15/16 0448 06/16/16 0431  Weight: 93.4 kg (205 lb 14.6 oz) 92.9 kg (204 lb 12.9 oz) 93.3 kg (205 lb 11 oz)    Examination:  General exam: Appears calm and comfortable  Respiratory system: Clear to auscultation. Respiratory effort normal. Nasal  cannula in place Cardiovascular system: S1 & S2 heard, RRR. Soft I/VI systolic murmur, No rubs, gallops or clicks. No pedal edema. Gastrointestinal system: Abdomen is nondistended, soft and nontender. No organomegaly or masses felt. Normal bowel sounds heard. Central nervous system: Alert and oriented. No focal neurological deficits. Extremities: Symmetric 5 x 5 power. Skin: No rashes, lesions or ulcers Psychiatry: Judgement and insight appear normal. Mood & affect appropriate.     Data Reviewed: I have personally reviewed following labs and imaging studies  CBC:  Recent Labs Lab 06/14/16 1035 06/15/16 0501 06/16/16 0419  WBC 9.8 11.0* 8.0  NEUTROABS 8.3* 9.0* 5.7  HGB 13.0 13.6 11.8*  HCT 39.4 41.4 36.3*  MCV 91.0 91.0 90.5  PLT 203 196 622   Basic Metabolic Panel:  Recent Labs Lab 06/14/16 1035 06/15/16 0501 06/16/16 0419  NA 135 136 134*  K 4.4 3.7 3.7  CL 101 98* 99*  CO2 27 27 27   GLUCOSE 165* 142* 133*  BUN 16 15 15   CREATININE 1.08 0.99 0.95  CALCIUM 8.9 8.7*  8.5*  MG  --  1.7 1.9  PHOS  --  2.9 3.3   GFR: Estimated Creatinine Clearance: 64.1 mL/min (by C-G formula based on SCr of 0.95 mg/dL). Liver Function Tests:  Recent Labs Lab 06/14/16 1035 06/15/16 0501 06/16/16 0419  AST 23 21 19   ALT 17 15* 13*  ALKPHOS 42 47 40  BILITOT 0.6 1.2 1.1  PROT 7.1 7.6 6.7  ALBUMIN 3.9 4.1 3.5   No results for input(s): LIPASE, AMYLASE in the last 168 hours. No results for input(s): AMMONIA in the last 168 hours. Coagulation Profile: No results for input(s): INR, PROTIME in the last 168 hours. Cardiac Enzymes:  Recent Labs Lab 06/14/16 1752 06/14/16 2325 06/15/16 0501  TROPONINI 0.17* 0.15* 0.13*   BNP (last 3 results) No results for input(s): PROBNP in the last 8760 hours. HbA1C:  Recent Labs  06/14/16 1752  HGBA1C 6.3*   CBG: No results for input(s): GLUCAP in the last 168 hours. Lipid Profile:  Recent Labs  06/14/16 1752  06/15/16 0501  CHOL 110 115  HDL 31* 33*  LDLCALC 57 62  TRIG 108 98  CHOLHDL 3.5 3.5   Thyroid Function Tests:  Recent Labs  06/14/16 1752  TSH 1.935  FREET4 1.44*   Anemia Panel: No results for input(s): VITAMINB12, FOLATE, FERRITIN, TIBC, IRON, RETICCTPCT in the last 72 hours. Sepsis Labs: No results for input(s): PROCALCITON, LATICACIDVEN in the last 168 hours.  Recent Results (from the past 240 hour(s))  MRSA PCR Screening     Status: None   Collection Time: 06/14/16  7:36 PM  Result Value Ref Range Status   MRSA by PCR NEGATIVE NEGATIVE Final    Comment:        The GeneXpert MRSA Assay (FDA approved for NASAL specimens only), is one component of a comprehensive MRSA colonization surveillance program. It is not intended to diagnose MRSA infection nor to guide or monitor treatment for MRSA infections.          Radiology Studies: Dg Chest Port 1 View  Result Date: 06/14/2016 CLINICAL DATA:  Anterior chest pain for 3 days. EXAM: PORTABLE CHEST 1 VIEW COMPARISON:  07/29/2014. FINDINGS: 1022 hour. Lordotic positioning. The heart size and mediastinal contours are normal. The lungs are clear. There is no pleural effusion or pneumothorax. No acute osseous findings are identified. Telemetry leads overlie the chest. IMPRESSION: Stable chest.  No active cardiopulmonary process. Electronically Signed   By: Richardean Sale M.D.   On: 06/14/2016 10:37        Scheduled Meds: . atorvastatin  10 mg Oral q1800  . loratadine  10 mg Oral Daily  . metoprolol tartrate  50 mg Oral BID  . pantoprazole  40 mg Oral Daily  . rivaroxaban  20 mg Oral Q supper  . tamsulosin  0.4 mg Oral Daily   Continuous Infusions: . diltiazem (CARDIZEM) infusion 5 mg/hr (06/15/16 2111)     LOS: 1 day    Time spent: 30 minutes    Loretha Stapler, MD [facility] Pager [phone]  If 7PM-7AM, please contact night-coverage [email or web address] Password [email or web address] 06/16/2016, 8:16 AM

## 2016-06-17 DIAGNOSIS — I248 Other forms of acute ischemic heart disease: Secondary | ICD-10-CM

## 2016-06-17 LAB — GLUCOSE, CAPILLARY
Glucose-Capillary: 125 mg/dL — ABNORMAL HIGH (ref 65–99)
Glucose-Capillary: 127 mg/dL — ABNORMAL HIGH (ref 65–99)

## 2016-06-17 MED ORDER — METOPROLOL TARTRATE 50 MG PO TABS: 50.0000 mg | ORAL_TABLET | Freq: Two times a day (BID) | ORAL | 0 refills | Status: DC

## 2016-06-17 MED ORDER — ATORVASTATIN CALCIUM 10 MG PO TABS: 10.0000 mg | ORAL_TABLET | Freq: Every day | ORAL | 0 refills | Status: DC

## 2016-06-17 MED ORDER — DILTIAZEM HCL ER COATED BEADS 120 MG PO CP24: 120.0000 mg | ORAL_CAPSULE | Freq: Every day | ORAL | 0 refills | Status: DC

## 2016-06-17 MED ORDER — AMIODARONE HCL 200 MG PO TABS: 200.0000 mg | ORAL_TABLET | Freq: Every day | ORAL | 0 refills | Status: DC

## 2016-06-17 MED ORDER — DILTIAZEM HCL ER COATED BEADS 120 MG PO CP24
120.0000 mg | ORAL_CAPSULE | Freq: Every day | ORAL | Status: DC
  Administered 2016-06-17: 120 mg via ORAL
  Filled 2016-06-17: qty 1

## 2016-06-17 NOTE — Progress Notes (Signed)
Progress Note  Patient Name: Jesse Murphy Date of Encounter: 06/17/2016  Primary Cardiologist: Dr. Bronson Ing   Subjective   Was maintaining NSR, but went back into atrial fibrillation around 0830 this morning. Asymptomatic with this. No chest pain or palpitations.   Inpatient Medications    Scheduled Meds: . amiodarone  200 mg Oral Daily  . atorvastatin  10 mg Oral q1800  . diltiazem  30 mg Oral Q6H  . loratadine  10 mg Oral Daily  . metoprolol tartrate  50 mg Oral BID  . pantoprazole  40 mg Oral Daily  . rivaroxaban  20 mg Oral Q supper  . tamsulosin  0.4 mg Oral Daily   Continuous Infusions:  PRN Meds: acetaminophen **OR** acetaminophen, ipratropium-albuterol, ondansetron **OR** ondansetron (ZOFRAN) IV, senna-docusate, sodium chloride   Vital Signs    Vitals:   06/17/16 0500 06/17/16 0600 06/17/16 0700 06/17/16 0730  BP: (!) 119/54 (!) 113/57 (!) 119/58   Pulse: 66 66 66 73  Resp: 10 17 19  (!) 22  Temp:    98 F (36.7 C)  TempSrc:    Oral  SpO2: 97% 96% 98% 98%  Weight: 203 lb 11.3 oz (92.4 kg)     Height:        Intake/Output Summary (Last 24 hours) at 06/17/16 0838 Last data filed at 06/17/16 0700  Gross per 24 hour  Intake           985.92 ml  Output             1650 ml  Net          -664.08 ml   Filed Weights   06/15/16 0448 06/16/16 0431 06/17/16 0500  Weight: 204 lb 12.9 oz (92.9 kg) 205 lb 11 oz (93.3 kg) 203 lb 11.3 oz (92.4 kg)    Telemetry    NSR, HR in 70's - 80's until 0830 today. Now in atrial fibrillation, HR in 90's.  - Personally Reviewed  ECG    No new tracings.   Physical Exam   General: Well developed, well nourished Caucasian male appearing in no acute distress. Head: Normocephalic, atraumatic.  Neck: Supple without bruits, JVD not elevated. Lungs:  Resp regular and unlabored, CTA without wheezing or rales. Heart: Irregularly irregular, S1, S2, no S3, S4, or murmur; no rub. Abdomen: Soft, non-tender, non-distended with  normoactive bowel sounds. No hepatomegaly. No rebound/guarding. No obvious abdominal masses. Extremities: No clubbing, cyanosis, or lower extremity edema. Distal pedal pulses are 2+ bilaterally. Neuro: Alert and oriented X 3. Moves all extremities spontaneously. Psych: Normal affect.  Labs    Chemistry Recent Labs Lab 06/14/16 1035 06/15/16 0501 06/16/16 0419  NA 135 136 134*  K 4.4 3.7 3.7  CL 101 98* 99*  CO2 27 27 27   GLUCOSE 165* 142* 133*  BUN 16 15 15   CREATININE 1.08 0.99 0.95  CALCIUM 8.9 8.7* 8.5*  PROT 7.1 7.6 6.7  ALBUMIN 3.9 4.1 3.5  AST 23 21 19   ALT 17 15* 13*  ALKPHOS 42 47 40  BILITOT 0.6 1.2 1.1  GFRNONAA >60 >60 >60  GFRAA >60 >60 >60  ANIONGAP 7 11 8      Hematology Recent Labs Lab 06/14/16 1035 06/15/16 0501 06/16/16 0419  WBC 9.8 11.0* 8.0  RBC 4.33 4.55 4.01*  HGB 13.0 13.6 11.8*  HCT 39.4 41.4 36.3*  MCV 91.0 91.0 90.5  MCH 30.0 29.9 29.4  MCHC 33.0 32.9 32.5  RDW 14.1 14.4 14.5  PLT 203  196 190    Cardiac Enzymes Recent Labs Lab 06/14/16 1752 06/14/16 2325 06/15/16 0501  TROPONINI 0.17* 0.15* 0.13*    Recent Labs Lab 06/14/16 1050 06/14/16 1342  TROPIPOC 0.01 0.05     BNP Recent Labs Lab 06/14/16 1752  BNP 342.0*     DDimer No results for input(s): DDIMER in the last 168 hours.   Radiology    No results found.  Cardiac Studies   Echocardiogram: 06/15/2016 Study Conclusions  - Procedure narrative: Transthoracic echocardiography. Image   quality was fair. The study was technically difficult, as a   result of poor sound wave transmission. Intravenous contrast   (Definity) was administered. - Left ventricle: The cavity size was normal. Wall thickness was   normal. Systolic function was normal. The estimated ejection   fraction was in the range of 60% to 65%. Wall motion was normal;   there were no regional wall motion abnormalities. Left   ventricular diastolic function parameters were normal. - Aortic valve:  Mildly calcified leaflets. Morphologically, there   may be a mild degree of aortic valve stenosis. Valve area (VTI):   1.6 cm^2. Valve area (Vmax): 1.72 cm^2. Valve area (Vmean): 1.89   cm^2. - Mitral valve: Mildly calcified annulus. Mildly calcified anterior   leaflet tip. - Systemic veins: IVC was dilated with normal respiratory   variation. Estimated CVP 8 mmHg.  Patient Profile     83 y.o. male with PMH of CAD (s/p stent to unknown artery in 1992), PAF (on Xarelto), HTN, and HLD who presented to [facility] ED on 06/14/2016 for chest pain and acute dyspnea. Found to be in atrial fibrillation with RVR.    Assessment & Plan    1. Atrial fibrillation with RVR - presented with chest pain and dyspnea, found to be in atrial fibrillation with RVR. He converted to NSR with administration of IV Amiodarone. Unfortunately he went back into atrial fibrillation this AM but rates are mostly controlled in the 90's to low-100's.  - Echo this admission shows normal LV function with an EF 60-65%, no WMA, normal diastolic function, mild AS, and normal LA. TSH WNL. Mg 1.9. K+ 3.7. - This patients CHA2DS2-VASc Score and unadjusted Ischemic Stroke Rate (% per year) is equal to 7.2 % stroke rate/year from a score of 5 (HTN, DM, Vascular, Age (2)). Continue Xarelto for anticoagulation. - Currently on Lopressor 50mg  BID and Cardizem 30mg  Q6H. Has been started on Amiodarone 200mg  daily. Baseline LFT's within normal limits.  Will ask for morning Amiodarone and Lopressor to be given now. Was an anticipated discharge for today and if rates remain controlled, he could be discharged later today but would check HR with ambulation. Would recommend switching short-acting Cardizem to Cardizem CD 120mg  daily.   2. CAD/ Elevated Troponin  - Hx of stent to unknown artery in 1992 at [facility]. Last office note by Dr. Hamilton Capri on 08/26/2014 reports "Lexiscan cardiolite suggest multi-vessel CAD.   - troponin values have been flat at  0.17, 0.15, and 0.13 this admission likely secondary to demand ischemia in the setting of atrial fibrillation with RVR. - consider repeat Lexiscan Myoview as an outpatient.   3. Hypercholesterolemia - lipid panel this admission shows total cholesterol 115, HDL 33, and LDL 62. - continue statin therapy with goal of LDL < 70 with known CAD.    Signed, Erma Heritage , PA-C 8:38 AM 06/17/2016 Pager: [phone]  The patient was seen and examined, and I agree with the history,  physical exam, assessment and plan as documented above, with modifications as noted below. He is feeling well and denies chest pain, palpitations, and shortness of breath. Went back into a fib this morning with rates right around 100 bpm during the time of my exam. Agree with amiodarone, metoprolol, and switch to long-acting diltiazem. If he has reasonable rate control with ambulation, would anticipate discharge later today.   Kate Sable, MD, [facility]  06/17/2016 9:10 AM

## 2016-06-17 NOTE — Discharge Instructions (Signed)
Atrial Fibrillation Atrial fibrillation is a type of heartbeat that is irregular or fast (rapid). If you have this condition, your heart keeps quivering in a weird (chaotic) way. This condition can make it so your heart cannot pump blood normally. Having this condition gives a person more risk for stroke, heart failure, and other heart problems. There are different types of atrial fibrillation. Talk with your doctor to learn about the type that you have. Follow these instructions at home:  Take over-the-counter and prescription medicines only as told by your doctor.  If your doctor prescribed a blood-thinning medicine, take it exactly as told. Taking too much of it can cause bleeding. If you do not take enough of it, you will not have the protection that you need against stroke and other problems.  Do not use any tobacco products. These include cigarettes, chewing tobacco, and e-cigarettes. If you need help quitting, ask your doctor.  If you have apnea (obstructive sleep apnea), manage it as told by your doctor.  Do not drink alcohol.  Do not drink beverages that have caffeine. These include coffee, soda, and tea.  Maintain a healthy weight. Do not use diet pills unless your doctor says they are safe for you. Diet pills may make heart problems worse.  Follow diet instructions as told by your doctor.  Exercise regularly as told by your doctor.  Keep all follow-up visits as told by your doctor. This is important. Contact a doctor if:  You notice a change in the speed, rhythm, or strength of your heartbeat.  You are taking a blood-thinning medicine and you notice more bruising.  You get tired more easily when you move or exercise. Get help right away if:  You have pain in your chest or your belly (abdomen).  You have sweating or weakness.  You feel sick to your stomach (nauseous).  You notice blood in your throw up (vomit), poop (stool), or pee (urine).  You are short of  breath.  You suddenly have swollen feet and ankles.  You feel dizzy.  Your suddenly get weak or numb in your face, arms, or legs, especially if it happens on one side of your body.  You have trouble talking, trouble understanding, or both.  Your face or your eyelid droops on one side. These symptoms may be an emergency. Do not wait to see if the symptoms will go away. Get medical help right away. Call your local emergency services (911 in the [place] Do not drive yourself to the hospital. This information is not intended to replace advice given to you by your health care provider. Make sure you discuss any questions you have with your health care provider. Document Released: 11/11/2007 Document Revised: 07/10/2015 Document Reviewed: 05/29/2014 Elsevier Interactive Patient Education  2017 [employer].

## 2016-06-17 NOTE — Progress Notes (Signed)
Resumed A-Fib on monitor after being told that he may go home sometime today. Per request of PA gave 10:00 medications early.

## 2016-06-17 NOTE — Progress Notes (Signed)
Being discharged to home. All IV access removed and covered with bandaid.

## 2016-06-17 NOTE — Discharge Summary (Signed)
Physician Discharge Summary  Jesse Murphy:[id number] DOB: 28-Feb-1928 DOA: 06/14/2016  PCP: Deloria Lair., MD  Admit date: 06/14/2016 Discharge date: 06/20/2016  Admitted From: Home  Disposition:  Home  Recommendations for Outpatient Follow-up:  1. Follow up with PCP in 1-2 weeks 2. Follow up with cardiology at previously scheduled appointment 3. Please obtain CMP/CBC in one week 4. Take medications as prescribed   Home Health: No   Equipment/Devices: None   Discharge Condition: Stable   CODE STATUS: Full Code  Diet recommendation: Heart Healthy    Brief/Interim Summary: Jesse Murphy a 83 y.o.malewith medical history significant of HTN, HLD, ?DM (was previously on Metformin), BPH, Seasonal Allergies/Allergic Rhinitis, GERD, Paroxysmal Atrial Fibrillation on Anticoagulation with Xarelto who presented to [facility] ED with a cc of Chest Pressure that radiated into his Left arm and shoulder, Palpations and Shortness of Breath worsening while ambulating. Patient has a Hx of CAD and is s/p a stent in 1994. Patient states 3 days ago he was digging leaves out of his ditch when he started developing Palpitations and SOB which progressed to chest pressure that radiated to his arms and shoulder. He stated that he becomes more winded and family has noted that he wasn't able to walk as far before getting SOB. Patient has had very mild leg swelling. Describes the pain/heaviness as a 9/10 in severity but has gone away now. Denied N/V or lightheadedness or dizziness. No diaphoresis. Workup in the ED showed patient was in A Fib with RVR. [facility] was called to admit the patient. Patient was placed in SDU on Cardizem gtt hoever overnight he had a reuccernce of chest pressure and worsening dyspnea and given one dose of IV Lasix. Cardiology was consulted and Diltiazem gtt was increased and home metoprolol was started. He was also given 150 mg of IV Amiodarone in attempt to chemically cardiovert him.  ECHOCardiogram was also done (results below).   His heart rate was, for the most part, controlled on Amiodarone and he was stable for discharge with follow up with cardiology outpatient.  Discharge Diagnoses:  Principal Problem:   Atrial fibrillation with RVR (HCC) Active Problems:   AF (paroxysmal atrial fibrillation) (HCC)   Chest pain in adult   Dyspnea on exertion   Allergic rhinitis   GERD (gastroesophageal reflux disease)   BPH (benign prostatic hyperplasia)    Discharge Instructions  Discharge Instructions    Call MD for:  difficulty breathing, headache or visual disturbances    Complete by:  As directed    Call MD for:  extreme fatigue    Complete by:  As directed    Call MD for:  hives    Complete by:  As directed    Call MD for:  persistant dizziness or light-headedness    Complete by:  As directed    Call MD for:  persistant nausea and vomiting    Complete by:  As directed    Call MD for:  severe uncontrolled pain    Complete by:  As directed    Call MD for:  temperature >100.4    Complete by:  As directed    Diet - low sodium heart healthy    Complete by:  As directed    Discharge instructions    Complete by:  As directed    Follow up with cardiology outpatient Schedule follow up with your PCP within 1-2 weeks   Increase activity slowly    Complete by:  As directed  Allergies as of 06/17/2016   No Known Allergies     Medication List    STOP taking these medications   simvastatin 20 MG tablet Commonly known as:  ZOCOR     TAKE these medications   amiodarone 200 MG tablet Commonly known as:  PACERONE Take 1 tablet (200 mg total) by mouth daily.   atorvastatin 10 MG tablet Commonly known as:  LIPITOR Take 1 tablet (10 mg total) by mouth daily at 6 PM.   diltiazem 120 MG 24 hr capsule Commonly known as:  CARDIZEM CD Take 1 capsule (120 mg total) by mouth daily.   loratadine 10 MG tablet Commonly known as:  CLARITIN Take 10 mg by mouth  daily.   Metoprolol Tartrate 75 MG Tabs Take 75 mg by mouth 2 (two) times daily. What changed:  medication strength  how much to take   metoprolol 50 MG tablet Commonly known as:  LOPRESSOR Take 1 tablet (50 mg total) by mouth 2 (two) times daily. What changed:  You were already taking a medication with the same name, and this prescription was added. Make sure you understand how and when to take each.   omeprazole 20 MG capsule Commonly known as:  PRILOSEC Take 20 mg by mouth daily as needed (acid reflux).   rivaroxaban 20 MG Tabs tablet Commonly known as:  XARELTO Take 20 mg by mouth daily with supper.   tamsulosin 0.4 MG Caps capsule Commonly known as:  FLOMAX Take 0.4 mg by mouth daily.      Follow-up Information    [facility] EMERGENCY DEPARTMENT Follow up.   Specialty:  Emergency Medicine Why:  As needed, If symptoms worsen Contact information: [address] [id number] Prudy Feeler [place] [address] [phone]       Lendon Colonel, NP Follow up on 07/01/2016.   Specialties:  Nurse Practitioner, Radiology, Cardiology Why:  Cardiology Hospital Follow-up on 07/01/2016 at 4:00PM.  Contact information: [place] [address] [phone]        Deloria Lair., MD. Schedule an appointment as soon as possible for a visit in 1 week(s).   Specialty:  Family Medicine Contact information: [place] [address] [phone]          No Known Allergies  Consultations:  Cardiology  PT  Procedures/Studies: Dg Chest Port 1 View  Result Date: 06/14/2016 CLINICAL DATA:  Anterior chest pain for 3 days. EXAM: PORTABLE CHEST 1 VIEW COMPARISON:  07/29/2014. FINDINGS: 1022 hour. Lordotic positioning. The heart size and mediastinal contours are normal. The lungs are clear. There is no pleural effusion or pneumothorax. No acute osseous findings are identified. Telemetry leads overlie the chest. IMPRESSION: Stable  chest.  No active cardiopulmonary process. Electronically Signed   By: Richardean Sale M.D.   On: 06/14/2016 10:37   Echocardiogram: Left ventricle: The cavity size was normal. Wall thickness was   normal. Systolic function was normal. The estimated ejection   fraction was in the range of 60% to 65%. Wall motion was normal;   there were no regional wall motion abnormalities. Left   ventricular diastolic function parameters were normal. - Aortic valve: Mildly calcified leaflets. Morphologically, there   may be a mild degree of aortic valve stenosis. Valve area (VTI):   1.6 cm^2. Valve area (Vmax): 1.72 cm^2. Valve area (Vmean): 1.89   cm^2. - Mitral valve: Mildly calcified annulus. Mildly calcified anterior   leaflet tip. - Systemic veins: IVC was  dilated with normal respiratory   variation. Estimated CVP 8 mmHg.  Subjective: Patient anxious to go home today.  Says he feels well and denies palpitations.  Discharge Exam: Vitals:   06/17/16 1400 06/17/16 1500  BP: 120/62   Pulse: 82 80  Resp: 20 19  Temp:     Vitals:   06/17/16 1200 06/17/16 1300 06/17/16 1400 06/17/16 1500  BP: 130/75 (!) 115/59 120/62   Pulse: 86 79 82 80  Resp: 20 (!) 23 20 19   Temp:      TempSrc:      SpO2: 98% 97% 96% 96%  Weight:      Height:        General: Pt is alert, awake, not in acute distress Cardiovascular: irregularly irregular, S1/S2 +, no rubs, no gallops Respiratory: CTA bilaterally, no wheezing, no rhonchi Abdominal: Soft, NT, ND, bowel sounds + Extremities: no edema, no cyanosis    The results of significant diagnostics from this hospitalization (including imaging, microbiology, ancillary and laboratory) are listed below for reference.     Microbiology: Recent Results (from the past 240 hour(s))  MRSA PCR Screening     Status: None   Collection Time: 06/14/16  7:36 PM  Result Value Ref Range Status   MRSA by PCR NEGATIVE NEGATIVE Final    Comment:        The GeneXpert MRSA  Assay (FDA approved for NASAL specimens only), is one component of a comprehensive MRSA colonization surveillance program. It is not intended to diagnose MRSA infection nor to guide or monitor treatment for MRSA infections.      Labs: BNP (last 3 results)  Recent Labs  06/14/16 1752  BNP 268.3*   Basic Metabolic Panel:  Recent Labs Lab 06/14/16 1035 06/15/16 0501 06/16/16 0419  NA 135 136 134*  K 4.4 3.7 3.7  CL 101 98* 99*  CO2 27 27 27   GLUCOSE 165* 142* 133*  BUN 16 15 15   CREATININE 1.08 0.99 0.95  CALCIUM 8.9 8.7* 8.5*  MG  --  1.7 1.9  PHOS  --  2.9 3.3   Liver Function Tests:  Recent Labs Lab 06/14/16 1035 06/15/16 0501 06/16/16 0419  AST 23 21 19   ALT 17 15* 13*  ALKPHOS 42 47 40  BILITOT 0.6 1.2 1.1  PROT 7.1 7.6 6.7  ALBUMIN 3.9 4.1 3.5   No results for input(s): LIPASE, AMYLASE in the last 168 hours. No results for input(s): AMMONIA in the last 168 hours. CBC:  Recent Labs Lab 06/14/16 1035 06/15/16 0501 06/16/16 0419  WBC 9.8 11.0* 8.0  NEUTROABS 8.3* 9.0* 5.7  HGB 13.0 13.6 11.8*  HCT 39.4 41.4 36.3*  MCV 91.0 91.0 90.5  PLT 203 196 190   Cardiac Enzymes:  Recent Labs Lab 06/14/16 1752 06/14/16 2325 06/15/16 0501  TROPONINI 0.17* 0.15* 0.13*   BNP: Invalid input(s): POCBNP CBG:  Recent Labs Lab 06/16/16 1941 06/17/16 0005 06/17/16 0432  GLUCAP 116* 125* 127*   D-Dimer No results for input(s): DDIMER in the last 72 hours. Hgb A1c No results for input(s): HGBA1C in the last 72 hours. Lipid Profile No results for input(s): CHOL, HDL, LDLCALC, TRIG, CHOLHDL, LDLDIRECT in the last 72 hours. Thyroid function studies No results for input(s): TSH, T4TOTAL, T3FREE, THYROIDAB in the last 72 hours.  Invalid input(s): FREET3 Anemia work up No results for input(s): VITAMINB12, FOLATE, FERRITIN, TIBC, IRON, RETICCTPCT in the last 72 hours. Urinalysis    Component Value Date/Time   COLORURINE YELLOW  06/14/2016 1019    APPEARANCEUR CLEAR 06/14/2016 1019   LABSPEC 1.017 06/14/2016 1019   PHURINE 6.0 06/14/2016 1019   GLUCOSEU NEGATIVE 06/14/2016 1019   HGBUR NEGATIVE 06/14/2016 1019   BILIRUBINUR NEGATIVE 06/14/2016 1019   KETONESUR NEGATIVE 06/14/2016 1019   PROTEINUR NEGATIVE 06/14/2016 1019   NITRITE NEGATIVE 06/14/2016 1019   LEUKOCYTESUR NEGATIVE 06/14/2016 1019   Sepsis Labs Invalid input(s): PROCALCITONIN,  WBC,  LACTICIDVEN Microbiology Recent Results (from the past 240 hour(s))  MRSA PCR Screening     Status: None   Collection Time: 06/14/16  7:36 PM  Result Value Ref Range Status   MRSA by PCR NEGATIVE NEGATIVE Final    Comment:        The GeneXpert MRSA Assay (FDA approved for NASAL specimens only), is one component of a comprehensive MRSA colonization surveillance program. It is not intended to diagnose MRSA infection nor to guide or monitor treatment for MRSA infections.      Time coordinating discharge: 35 minutes  SIGNED:   Loretha Stapler, MD  [facility] 06/20/2016, 4:55 PM Pager [phone] If 7PM-7AM, please contact night-coverage [email or web address] Password [email or web address]

## 2016-07-01 ENCOUNTER — Ambulatory Visit: Payer: Medicare Other | Admitting: Adult Health

## 2016-07-05 ENCOUNTER — Ambulatory Visit (INDEPENDENT_AMBULATORY_CARE_PROVIDER_SITE_OTHER): Payer: Medicare Other | Admitting: Cardiology

## 2016-07-05 ENCOUNTER — Other Ambulatory Visit (HOSPITAL_COMMUNITY)
Admission: RE | Admit: 2016-07-05 | Discharge: 2016-07-05 | Disposition: A | Discharge status: Home / Self Care | Payer: Medicare Other | Source: Ambulatory Visit | Attending: Cardiology | Admitting: Cardiology

## 2016-07-05 ENCOUNTER — Encounter: Payer: Self-pay | Admitting: Cardiology

## 2016-07-05 VITALS — BP 136/64 | HR 64 | Ht 69.0 in | Wt 203.0 lb

## 2016-07-05 DIAGNOSIS — Z7901 Long term (current) use of anticoagulants: Secondary | ICD-10-CM | POA: Insufficient documentation

## 2016-07-05 DIAGNOSIS — I4891 Unspecified atrial fibrillation: Secondary | ICD-10-CM | POA: Diagnosis not present

## 2016-07-05 DIAGNOSIS — R0609 Other forms of dyspnea: Secondary | ICD-10-CM

## 2016-07-05 DIAGNOSIS — I480 Paroxysmal atrial fibrillation: Secondary | ICD-10-CM | POA: Diagnosis not present

## 2016-07-05 DIAGNOSIS — Z9861 Coronary angioplasty status: Secondary | ICD-10-CM

## 2016-07-05 DIAGNOSIS — D649 Anemia, unspecified: Secondary | ICD-10-CM | POA: Diagnosis present

## 2016-07-05 DIAGNOSIS — R0600 Dyspnea, unspecified: Secondary | ICD-10-CM

## 2016-07-05 DIAGNOSIS — E119 Type 2 diabetes mellitus without complications: Secondary | ICD-10-CM | POA: Diagnosis not present

## 2016-07-05 DIAGNOSIS — I2510 Atherosclerotic heart disease of native coronary artery without angina pectoris: Secondary | ICD-10-CM

## 2016-07-05 LAB — CBC WITH DIFFERENTIAL/PLATELET
Basophils Absolute: 0 10*3/uL (ref 0.0–0.1)
Basophils Relative: 0 %
Eosinophils Absolute: 0.3 10*3/uL (ref 0.0–0.7)
Eosinophils Relative: 4 %
HCT: 37 % — ABNORMAL LOW (ref 39.0–52.0)
Hemoglobin: 11.9 g/dL — ABNORMAL LOW (ref 13.0–17.0)
Lymphocytes Relative: 14 %
Lymphs Abs: 0.9 10*3/uL (ref 0.7–4.0)
MCH: 29.2 pg (ref 26.0–34.0)
MCHC: 32.2 g/dL (ref 30.0–36.0)
MCV: 90.9 fL (ref 78.0–100.0)
Monocytes Absolute: 0.6 10*3/uL (ref 0.1–1.0)
Monocytes Relative: 9 %
Neutro Abs: 4.6 10*3/uL (ref 1.7–7.7)
Neutrophils Relative %: 73 %
Platelets: 228 10*3/uL (ref 150–400)
RBC: 4.07 MIL/uL — ABNORMAL LOW (ref 4.22–5.81)
RDW: 13.9 % (ref 11.5–15.5)
WBC: 6.4 10*3/uL (ref 4.0–10.5)

## 2016-07-05 MED ORDER — METOPROLOL TARTRATE 25 MG PO TABS: 25.0000 mg | ORAL_TABLET | Freq: Two times a day (BID) | ORAL | 3 refills | Status: DC

## 2016-07-05 NOTE — Assessment & Plan Note (Addendum)
Recently taken off Metformin secondary to GI side effects

## 2016-07-05 NOTE — Assessment & Plan Note (Signed)
Admitted 06/14/16 with AF with RVR and chest pain-converted to NSR with amiodarone

## 2016-07-05 NOTE — Progress Notes (Signed)
07/05/2016 Jesse Murphy   March 03, 1928  [id number]  Primary Physician Deloria Lair., MD Primary Cardiologist: Dr Bronson Ing  HPI:  83 y/o male with a history of CAD, s/p remote PCI 1992 at [facility]. His former cardiologist at [facility] did a nuclear stress in 2016 that reportedly was abnormal but the pt was treated medically. He presented to [facility] 06/14/16 with chest pain and was found to be in AF with RVR. His Troponin were elevated but low level and flat. He initially converted with IV Amiodarone but went back into AF before discharge, though now his rate was controlled. Echo done during that admission showed normal LVF with normal LA size.   He is in the office today for follow up. Since discharge he has not had further chest pain. His main complaint is fatigue and dizziness when standing.    Current Outpatient Prescriptions  Medication Sig Dispense Refill  . amiodarone (PACERONE) 200 MG tablet Take 1 tablet (200 mg total) by mouth daily. 30 tablet 0  . atorvastatin (LIPITOR) 10 MG tablet Take 1 tablet (10 mg total) by mouth daily at 6 PM. 30 tablet 0  . loratadine (CLARITIN) 10 MG tablet Take 10 mg by mouth daily.    Marland Kitchen omeprazole (PRILOSEC) 20 MG capsule Take 20 mg by mouth daily as needed (acid reflux).    . rivaroxaban (XARELTO) 20 MG TABS tablet Take 20 mg by mouth daily with supper.    . tamsulosin (FLOMAX) 0.4 MG CAPS capsule Take 0.4 mg by mouth daily.    . metoprolol tartrate (LOPRESSOR) 25 MG tablet Take 1 tablet (25 mg total) by mouth 2 (two) times daily. 180 tablet 3   No current facility-administered medications for this visit.     No Known Allergies  Past Medical History:  Diagnosis Date  . Atrial fibrillation ([place])   . Cancer [facility])    prostate  . Hypertension     Social History   Social History  . Marital status: Married    Spouse name: N/A  . Number of children: N/A  . Years of education: N/A   Occupational History  . Not on file.   Social  History Main Topics  . Smoking status: Former Smoker    Types: Cigarettes    Quit date: 02/15/1990  . Smokeless tobacco: Never Used  . Alcohol use No  . Drug use: No  . Sexual activity: Not on file   Other Topics Concern  . Not on file   Social History Narrative  . No narrative on file     Family History  Problem Relation Age of Onset  . CAD Mother 2  . CAD Father 27  . CAD Sister   . CAD Brother      Review of Systems: General: negative for chills, fever, night sweats or weight changes.  Cardiovascular: negative for chest pain, dyspnea on exertion, edema, orthopnea, palpitations, paroxysmal nocturnal dyspnea or shortness of breath Dermatological: negative for rash Respiratory: negative for cough or wheezing Urologic: negative for hematuria Abdominal: negative for nausea, vomiting, diarrhea, bright red blood per rectum, melena, or hematemesis Neurologic: negative for visual changes, syncope, or dizziness All other systems reviewed and are otherwise negative except as noted above.    Blood pressure 136/64, pulse 64, height 5\' 9"  (1.753 m), weight 203 lb (92.1 kg), SpO2 96 %.  General appearance: alert, cooperative and no distress Neck: no carotid bruit and no JVD Lungs: clear to auscultation bilaterally Heart: regular rate  and rhythm Extremities: no edema Skin: pale cool dry Neurologic: Grossly normal  EKG NSR, HR 65  ASSESSMENT AND PLAN:   Atrial fibrillation with RVR (HCC) Admitted 06/14/16 with AF with RVR and chest pain-converted to NSR with amiodarone  Dyspnea on exertion Pt complains of fatigue and DOE after taking Metoprolol  CAD S/P percutaneous coronary angioplasty Hx of PCI in 1992 at [facility] with a reported abnormal Cardiolyte in 2016 ([facility])  Type 2 diabetes mellitus without complications ([place]) Recently taken off Metformin secondary to GI side effects  Chronic anticoagulation CHA2DS2 VASc= 5 for CHF, age, HTN, and DM-(he had been taken off  Metformin secondary to loose stools) He is on Xarelto and told me he has been on this for a couple of years (Prior history of PAF).   PLAN  Since he is now on Amiodarone and in NSR I feel its safe to cut his beta blocker back and stop Diltiazem. He is not having angina now that he is in NSR and I did not order a Myoview.   He has borderline DM followed by Dr Scotty Court and is to see him in the next few weeks. I did order a CBC today to r/o anemia- he looks pale but no history of melena. F/U here in 4 weeks.   Kerin Ransom PA-C 07/05/2016 2:00 PM

## 2016-07-05 NOTE — Assessment & Plan Note (Signed)
CHA2DS2 VASc= 5 for CHF, age, HTN, and DM-(he had been taken off Metformin secondary to loose stools) He is on Xarelto and told me he has been on this for a couple of years (Prior history of PAF).

## 2016-07-05 NOTE — Assessment & Plan Note (Addendum)
Hx of PCI in 1992 at [facility] with a reported abnormal Cardiolyte in 2016 ([facility])

## 2016-07-05 NOTE — Assessment & Plan Note (Signed)
Pt complains of fatigue and DOE after taking Metoprolol

## 2016-07-05 NOTE — Patient Instructions (Addendum)
Your physician recommends that you schedule a follow-up appointment in: 4 Weeks with Dr. Bronson Ing   Your physician has recommended you make the following change in your medication:   Decrease Lopressor (Metoprolol)  to 25 mg Two Times Daily   STOP Taking Cardizem ( Diltiazem)   Your physician recommends that you return for lab work in: Today   If you need a refill on your cardiac medications before your next appointment, please call your pharmacy.  Thank you for choosing [facility]!

## 2016-08-20 ENCOUNTER — Telehealth: Payer: Self-pay

## 2016-08-20 ENCOUNTER — Ambulatory Visit (HOSPITAL_COMMUNITY)
Admission: RE | Admit: 2016-08-20 | Discharge: 2016-08-20 | Disposition: A | Discharge status: Home / Self Care | Payer: Medicare Other | Source: Ambulatory Visit | Attending: Cardiovascular Disease | Admitting: Cardiovascular Disease

## 2016-08-20 ENCOUNTER — Ambulatory Visit (INDEPENDENT_AMBULATORY_CARE_PROVIDER_SITE_OTHER): Payer: Medicare Other | Admitting: Cardiovascular Disease

## 2016-08-20 ENCOUNTER — Encounter: Payer: Self-pay | Admitting: Cardiovascular Disease

## 2016-08-20 VITALS — BP 146/71 | HR 79 | Ht 69.0 in | Wt 203.0 lb

## 2016-08-20 DIAGNOSIS — R05 Cough: Secondary | ICD-10-CM | POA: Diagnosis present

## 2016-08-20 DIAGNOSIS — R0600 Dyspnea, unspecified: Secondary | ICD-10-CM

## 2016-08-20 DIAGNOSIS — I517 Cardiomegaly: Secondary | ICD-10-CM | POA: Insufficient documentation

## 2016-08-20 DIAGNOSIS — R748 Abnormal levels of other serum enzymes: Secondary | ICD-10-CM

## 2016-08-20 DIAGNOSIS — I25118 Atherosclerotic heart disease of native coronary artery with other forms of angina pectoris: Secondary | ICD-10-CM | POA: Diagnosis not present

## 2016-08-20 DIAGNOSIS — I480 Paroxysmal atrial fibrillation: Secondary | ICD-10-CM

## 2016-08-20 DIAGNOSIS — R918 Other nonspecific abnormal finding of lung field: Secondary | ICD-10-CM | POA: Diagnosis not present

## 2016-08-20 DIAGNOSIS — Z9861 Coronary angioplasty status: Secondary | ICD-10-CM

## 2016-08-20 DIAGNOSIS — I2510 Atherosclerotic heart disease of native coronary artery without angina pectoris: Secondary | ICD-10-CM

## 2016-08-20 DIAGNOSIS — I209 Angina pectoris, unspecified: Secondary | ICD-10-CM | POA: Diagnosis not present

## 2016-08-20 DIAGNOSIS — R0609 Other forms of dyspnea: Secondary | ICD-10-CM | POA: Insufficient documentation

## 2016-08-20 DIAGNOSIS — Z7901 Long term (current) use of anticoagulants: Secondary | ICD-10-CM | POA: Diagnosis not present

## 2016-08-20 DIAGNOSIS — R7989 Other specified abnormal findings of blood chemistry: Secondary | ICD-10-CM

## 2016-08-20 DIAGNOSIS — Z955 Presence of coronary angioplasty implant and graft: Secondary | ICD-10-CM

## 2016-08-20 DIAGNOSIS — R778 Other specified abnormalities of plasma proteins: Secondary | ICD-10-CM

## 2016-08-20 DIAGNOSIS — R059 Cough, unspecified: Secondary | ICD-10-CM

## 2016-08-20 MED ORDER — FUROSEMIDE 40 MG PO TABS: ORAL_TABLET | ORAL | 0 refills | Status: DC

## 2016-08-20 NOTE — Telephone Encounter (Signed)
-----   Message from Herminio Commons, MD sent at 08/20/2016  3:09 PM EDT ----- Please give Lasix 40 mg x 1 dose and reassess for symptom improvement.

## 2016-08-20 NOTE — Progress Notes (Signed)
SUBJECTIVE: The patient presents for follow-up of coronary artery disease and atrial fibrillation. He had stenting to an unknown artery in 1992. He also has mild aortic stenosis.  He saw L. Kilroy PA-C on 07/05/16. He reduced beta blocker dosage and stopped diltiazem. He is currently on amiodarone 200 mg daily and metoprolol 25 mg twice daily. He is anticoagulated with Xarelto.  He has been more short of breath since his last office visit. He said he tires easily. He denies chest pain. He has had periodic chest congestion and has been coughing up both white and light yellow sputum. He denies fevers.  Hemoglobin 11.9 on 07/05/16.  While hospitalized, troponins were 0.17, 0.15, and 0.13.  He had a stress test in 2016 which reportedly showed multivessel disease.   Review of Systems: As per "subjective", otherwise negative.  No Known Allergies  Current Outpatient Prescriptions  Medication Sig Dispense Refill  . amiodarone (PACERONE) 200 MG tablet Take 1 tablet (200 mg total) by mouth daily. 30 tablet 0  . atorvastatin (LIPITOR) 10 MG tablet Take 1 tablet (10 mg total) by mouth daily at 6 PM. 30 tablet 0  . loratadine (CLARITIN) 10 MG tablet Take 10 mg by mouth daily.    . metoprolol tartrate (LOPRESSOR) 25 MG tablet Take 1 tablet (25 mg total) by mouth 2 (two) times daily. 180 tablet 3  . omeprazole (PRILOSEC) 20 MG capsule Take 20 mg by mouth daily as needed (acid reflux).    . rivaroxaban (XARELTO) 20 MG TABS tablet Take 20 mg by mouth daily with supper.    . tamsulosin (FLOMAX) 0.4 MG CAPS capsule Take 0.4 mg by mouth daily.     No current facility-administered medications for this visit.     Past Medical History:  Diagnosis Date  . Atrial fibrillation ([place])   . Cancer [facility])    prostate  . Hypertension     Past Surgical History:  Procedure Laterality Date  . HERNIA REPAIR      Social History   Social History  . Marital status: Married    Spouse name: N/A  .  Number of children: N/A  . Years of education: N/A   Occupational History  . Not on file.   Social History Main Topics  . Smoking status: Former Smoker    Types: Cigarettes    Quit date: 02/15/1990  . Smokeless tobacco: Never Used  . Alcohol use No  . Drug use: No  . Sexual activity: Not on file   Other Topics Concern  . Not on file   Social History Narrative  . No narrative on file     Vitals:   08/20/16 1336  BP: (!) 146/71  Pulse: 79  Weight: 203 lb (92.1 kg)  Height: 5\' 9"  (1.753 m)    Wt Readings from Last 3 Encounters:  08/20/16 203 lb (92.1 kg)  07/05/16 203 lb (92.1 kg)  06/17/16 203 lb 11.3 oz (92.4 kg)     PHYSICAL EXAM General: NAD HEENT: Normal. Neck: No JVD, no thyromegaly. Lungs: Faint crackles at right base. No wheezes. CV: Nondisplaced PMI.  Regular rate and rhythm, normal S1/S2, no S3/S4, no murmur. No pretibial or periankle edema.  No carotid bruit.   Abdomen: Soft, nontender, no distention.  Neurologic: Alert and oriented.  Psych: Normal affect. Skin: Normal. Musculoskeletal: No gross deformities.    ECG: Most recent ECG reviewed.   Labs: Lab Results  Component Value Date/Time   K 3.7 06/16/2016  04:19 AM   BUN 15 06/16/2016 04:19 AM   CREATININE 0.95 06/16/2016 04:19 AM   ALT 13 (L) 06/16/2016 04:19 AM   TSH 1.935 06/14/2016 05:52 PM   HGB 11.9 (L) 07/05/2016 02:49 PM     Lipids: Lab Results  Component Value Date/Time   LDLCALC 62 06/15/2016 05:01 AM   CHOL 115 06/15/2016 05:01 AM   TRIG 98 06/15/2016 05:01 AM   HDL 33 (L) 06/15/2016 05:01 AM       ASSESSMENT AND PLAN: 1. Shortness of breath and cough productive of sputum: I will obtain a chest x-ray. He denies fevers.  2. Exertional dyspnea and progressive fatigue in the context of coronary artery disease with previously abnormal nuclear stress test: I will repeat a Lexiscan Myoview. He did have mild troponin elevations while hospitalized earlier this year. He is on  metoprolol and Lipitor. He is not on aspirin as he is taking Xarelto.  3. Paroxysmal atrial fibrillation: Currently in a regular rhythm. Continue amiodarone and Xarelto.  4. Hypertension: Mildly elevated. Will monitor.    Disposition: Follow up 3 weeks.  Kate Sable, M.D., F.A.C.C.

## 2016-08-20 NOTE — Patient Instructions (Signed)
Your physician recommends that you schedule a follow-up appointment in:  3 weeks with Dr Bronson Ing    Get cheat x-ray today  Your physician has requested that you have a lexiscan myoview. For further information please visit [email or web address]. Please follow instruction sheet, as given.     Your physician recommends that you continue on your current medications as directed. Please refer to the Current Medication list given to you today.     If you need a refill on your cardiac medications before your next appointment, please call your pharmacy.      Thank you for choosing [facility] !

## 2016-08-20 NOTE — Telephone Encounter (Signed)
I spoke with pt, he will take lasix 40 mg and call us back on Monday

## 2016-08-20 NOTE — Addendum Note (Signed)
Addended by: Barbarann Ehlers A on: 08/20/2016 02:16 PM   Modules accepted: Orders

## 2016-08-20 NOTE — Telephone Encounter (Signed)
[id number] 

## 2016-08-23 ENCOUNTER — Telehealth: Payer: Self-pay

## 2016-08-23 MED ORDER — FUROSEMIDE 40 MG PO TABS: ORAL_TABLET | ORAL | 3 refills | Status: DC

## 2016-08-23 NOTE — Telephone Encounter (Signed)
-----   Message from Herminio Commons, MD sent at 08/23/2016 10:44 AM EDT ----- Can have him take Lasix 40 mg prn, but to let us know if he has to take it frequently.

## 2016-08-23 NOTE — Telephone Encounter (Signed)
Weight in office last week was 203 lbs, took Lasix 40 mg x 1 and weight today is 196 lbs.pt states he feels good.

## 2016-08-23 NOTE — Telephone Encounter (Signed)
LM with instructions for prn daily lasix use for swelling

## 2016-08-25 ENCOUNTER — Encounter (HOSPITAL_COMMUNITY)
Admission: RE | Admit: 2016-08-25 | Discharge: 2016-08-25 | Disposition: A | Discharge status: Home / Self Care | Payer: Medicare Other | Source: Ambulatory Visit | Attending: Cardiovascular Disease | Admitting: Cardiovascular Disease

## 2016-08-25 ENCOUNTER — Encounter (HOSPITAL_BASED_OUTPATIENT_CLINIC_OR_DEPARTMENT_OTHER)
Admission: RE | Admit: 2016-08-25 | Discharge: 2016-08-25 | Disposition: A | Discharge status: Home / Self Care | Payer: Medicare Other | Source: Ambulatory Visit | Attending: Cardiovascular Disease | Admitting: Cardiovascular Disease

## 2016-08-25 ENCOUNTER — Encounter (HOSPITAL_COMMUNITY): Payer: Self-pay

## 2016-08-25 DIAGNOSIS — R748 Abnormal levels of other serum enzymes: Secondary | ICD-10-CM

## 2016-08-25 DIAGNOSIS — R778 Other specified abnormalities of plasma proteins: Secondary | ICD-10-CM

## 2016-08-25 DIAGNOSIS — R7989 Other specified abnormal findings of blood chemistry: Secondary | ICD-10-CM

## 2016-08-25 DIAGNOSIS — I25118 Atherosclerotic heart disease of native coronary artery with other forms of angina pectoris: Secondary | ICD-10-CM | POA: Insufficient documentation

## 2016-08-25 LAB — NM MYOCAR MULTI W/SPECT W/WALL MOTION / EF
LV dias vol: 83 mL (ref 62–150)
LV sys vol: 36 mL
Peak HR: 76 {beats}/min
RATE: 0.42
Rest HR: 64 {beats}/min
SDS: 1
SRS: 5
SSS: 6
TID: 1.03

## 2016-08-25 MED ORDER — SODIUM CHLORIDE 0.9% FLUSH
INTRAVENOUS | Status: AC
  Administered 2016-08-25: 10 mL via INTRAVENOUS
  Filled 2016-08-25: qty 10

## 2016-08-25 MED ORDER — REGADENOSON 0.4 MG/5ML IV SOLN
INTRAVENOUS | Status: AC
  Administered 2016-08-25: 0.4 mg via INTRAVENOUS
  Filled 2016-08-25: qty 5

## 2016-08-25 MED ORDER — TECHNETIUM TC 99M TETROFOSMIN IV KIT
10.0000 | PACK | Freq: Once | INTRAVENOUS | Status: AC | PRN
  Administered 2016-08-25: 10 via INTRAVENOUS

## 2016-08-25 MED ORDER — TECHNETIUM TC 99M TETROFOSMIN IV KIT
30.0000 | PACK | Freq: Once | INTRAVENOUS | Status: AC | PRN
  Administered 2016-08-25: 30 via INTRAVENOUS

## 2016-09-13 ENCOUNTER — Ambulatory Visit: Payer: Medicare Other | Admitting: Cardiovascular Disease

## 2016-09-22 ENCOUNTER — Encounter: Payer: Self-pay | Admitting: Cardiovascular Disease

## 2016-09-22 ENCOUNTER — Ambulatory Visit (INDEPENDENT_AMBULATORY_CARE_PROVIDER_SITE_OTHER): Payer: Medicare Other | Admitting: Cardiovascular Disease

## 2016-09-22 VITALS — BP 130/60 | HR 67 | Ht 69.0 in | Wt 202.0 lb

## 2016-09-22 DIAGNOSIS — I25118 Atherosclerotic heart disease of native coronary artery with other forms of angina pectoris: Secondary | ICD-10-CM | POA: Diagnosis not present

## 2016-09-22 DIAGNOSIS — Z9861 Coronary angioplasty status: Secondary | ICD-10-CM | POA: Diagnosis not present

## 2016-09-22 DIAGNOSIS — R531 Weakness: Secondary | ICD-10-CM

## 2016-09-22 DIAGNOSIS — R0609 Other forms of dyspnea: Secondary | ICD-10-CM

## 2016-09-22 DIAGNOSIS — I480 Paroxysmal atrial fibrillation: Secondary | ICD-10-CM

## 2016-09-22 DIAGNOSIS — Z7901 Long term (current) use of anticoagulants: Secondary | ICD-10-CM | POA: Diagnosis not present

## 2016-09-22 DIAGNOSIS — R0600 Dyspnea, unspecified: Secondary | ICD-10-CM

## 2016-09-22 DIAGNOSIS — I209 Angina pectoris, unspecified: Secondary | ICD-10-CM | POA: Diagnosis not present

## 2016-09-22 DIAGNOSIS — Z955 Presence of coronary angioplasty implant and graft: Secondary | ICD-10-CM | POA: Diagnosis not present

## 2016-09-22 DIAGNOSIS — I5033 Acute on chronic diastolic (congestive) heart failure: Secondary | ICD-10-CM

## 2016-09-22 DIAGNOSIS — I2510 Atherosclerotic heart disease of native coronary artery without angina pectoris: Secondary | ICD-10-CM

## 2016-09-22 MED ORDER — POTASSIUM CHLORIDE ER 10 MEQ PO TBCR: 10.0000 meq | EXTENDED_RELEASE_TABLET | Freq: Every day | ORAL | 3 refills | Status: DC

## 2016-09-22 MED ORDER — FUROSEMIDE 40 MG PO TABS: 40.0000 mg | ORAL_TABLET | Freq: Every day | ORAL | 3 refills | Status: DC

## 2016-09-22 NOTE — Progress Notes (Signed)
SUBJECTIVE: The patient presents for follow-up of coronary artery disease and atrial fibrillation. He had stenting to an unknown artery in 1992. He also has mild aortic stenosis.  He saw L. Kilroy PA-C on 07/05/16. He reduced beta blocker dosage and stopped diltiazem. He is currently on amiodarone 200 mg daily and metoprolol 25 mg twice daily. He is anticoagulated with Xarelto.  He had a stress test in 2016 which reportedly showed multivessel disease.  TSH and liver transaminases were normal in April 2018.  He developed some shortness of breath weight gain for which I gave him Lasix and symptoms improved.  Chest x-ray 08/20/16 showed cardiomegaly with very mild bilateral interstitial prominence suggestive of very mild CHF.  Nuclear stress test 08/25/16 demonstrated prior inferoseptal/septal/apical myocardial infarction with no significant ischemia, LVEF 55-65%. It was deemed a low risk study.  He continues to experience exertional dyspnea. He also complains of generalized weakness. He says if he is standing and closes his eyes he leans towards the left and falls. He has also had exertional chest tightness.    Review of Systems: As per "subjective", otherwise negative.  No Known Allergies  Current Outpatient Prescriptions  Medication Sig Dispense Refill  . amiodarone (PACERONE) 200 MG tablet Take 1 tablet (200 mg total) by mouth daily. 30 tablet 0  . atorvastatin (LIPITOR) 10 MG tablet Take 1 tablet (10 mg total) by mouth daily at 6 PM. 30 tablet 0  . furosemide (LASIX) 40 MG tablet Take 40 mg by mouth daily if needed for swelling.Call office if you use it frequently 30 tablet 3  . loratadine (CLARITIN) 10 MG tablet Take 10 mg by mouth daily.    . metoprolol tartrate (LOPRESSOR) 25 MG tablet Take 1 tablet (25 mg total) by mouth 2 (two) times daily. 180 tablet 3  . omeprazole (PRILOSEC) 20 MG capsule Take 20 mg by mouth daily as needed (acid reflux).    . rivaroxaban (XARELTO) 20  MG TABS tablet Take 20 mg by mouth daily with supper.    . tamsulosin (FLOMAX) 0.4 MG CAPS capsule Take 0.4 mg by mouth daily.     No current facility-administered medications for this visit.     Past Medical History:  Diagnosis Date  . Atrial fibrillation ([place])   . Cancer [facility])    prostate  . Hypertension     Past Surgical History:  Procedure Laterality Date  . HERNIA REPAIR      Social History   Social History  . Marital status: Married    Spouse name: N/A  . Number of children: N/A  . Years of education: N/A   Occupational History  . Not on file.   Social History Main Topics  . Smoking status: Former Smoker    Types: Cigarettes    Quit date: 02/15/1990  . Smokeless tobacco: Never Used  . Alcohol use No  . Drug use: No  . Sexual activity: Not on file   Other Topics Concern  . Not on file   Social History Narrative  . No narrative on file     Vitals:   09/22/16 1349  BP: 130/60  Pulse: 67  SpO2: 95%  Weight: 202 lb (91.6 kg)  Height: 5\' 9"  (1.753 m)    Wt Readings from Last 3 Encounters:  09/22/16 202 lb (91.6 kg)  08/20/16 203 lb (92.1 kg)  07/05/16 203 lb (92.1 kg)     PHYSICAL EXAM General: NAD HEENT: Normal. Neck: No JVD, no  thyromegaly. Lungs: Faint bibasilar rales. CV: Nondisplaced PMI.  Regular rate and rhythm, normal S1/S2, no S3/S4, no murmur. No pretibial or periankle edema.   Abdomen: Soft, nontender, no distention.  Neurologic: Alert and oriented.  Psych: Normal affect. Skin: Normal. Musculoskeletal: No gross deformities.    ECG: Most recent ECG reviewed.   Labs: Lab Results  Component Value Date/Time   K 3.7 06/16/2016 04:19 AM   BUN 15 06/16/2016 04:19 AM   CREATININE 0.95 06/16/2016 04:19 AM   ALT 13 (L) 06/16/2016 04:19 AM   TSH 1.935 06/14/2016 05:52 PM   HGB 11.9 (L) 07/05/2016 02:49 PM     Lipids: Lab Results  Component Value Date/Time   LDLCALC 62 06/15/2016 05:01 AM   CHOL 115 06/15/2016 05:01 AM    TRIG 98 06/15/2016 05:01 AM   HDL 33 (L) 06/15/2016 05:01 AM       ASSESSMENT AND PLAN:  1. Acute on chronic diastolic heart failure: Weight is back up. I will start Lasix 40 mg daily along with 20 mEq of potassium chloride daily. I will check a basic metabolic panel within the next several days. Blood pressure is normal.  2. CAD: Evidence for prior MI as detailed above. No significant ischemic territories. He is on metoprolol and Lipitor. He is not on aspirin as he is taking Xarelto.  3. Paroxysmal atrial fibrillation: Currently in a regular rhythm. Continue amiodarone and Xarelto. TSH and liver transaminases were normal in April 2018.  4. Hypertension: Controlled. No changes.  5. Generalized weakness and falls: I will make a neurology referral.     Disposition: Follow up 6 weeks.   Kate Sable, M.D., F.A.C.C.

## 2016-09-22 NOTE — Patient Instructions (Signed)
Medication Instructions:  Your physician has recommended you make the following change in your medication:   Begin lasix 40 mg daily  Begin Potassium 20 meq daily  Please continue all other medications as prescribed  Labwork: BMET- on Monday  Testing/Procedures: NONE  Follow-Up: Your physician recommends that you schedule a follow-up appointment in: [place] Bronson Ing   Any Other Special Instructions Will Be Listed Below (If Applicable). You have been referred to NEUROLOGY   If you need a refill on your cardiac medications before your next appointment, please call your pharmacy.

## 2016-09-27 ENCOUNTER — Other Ambulatory Visit (HOSPITAL_COMMUNITY)
Admission: RE | Admit: 2016-09-27 | Discharge: 2016-09-27 | Disposition: A | Discharge status: Home / Self Care | Payer: Medicare Other | Source: Ambulatory Visit | Attending: Cardiovascular Disease | Admitting: Cardiovascular Disease

## 2016-09-27 DIAGNOSIS — R0609 Other forms of dyspnea: Secondary | ICD-10-CM | POA: Diagnosis present

## 2016-09-27 LAB — BASIC METABOLIC PANEL
Anion gap: 11 (ref 5–15)
BUN: 18 mg/dL (ref 6–20)
CO2: 29 mmol/L (ref 22–32)
Calcium: 8.9 mg/dL (ref 8.9–10.3)
Chloride: 96 mmol/L — ABNORMAL LOW (ref 101–111)
Creatinine, Ser: 1.2 mg/dL (ref 0.61–1.24)
GFR calc Af Amer: >60 mL/min (ref >60)
GFR calc non Af Amer: 53 mL/min — ABNORMAL LOW (ref >60)
Glucose, Bld: 212 mg/dL — ABNORMAL HIGH (ref 65–99)
Potassium: 3.8 mmol/L (ref 3.5–5.1)
Sodium: 136 mmol/L (ref 135–145)

## 2016-10-26 ENCOUNTER — Other Ambulatory Visit: Payer: Self-pay | Admitting: Urology

## 2016-10-26 DIAGNOSIS — D3000 Benign neoplasm of unspecified kidney: Secondary | ICD-10-CM

## 2016-11-04 ENCOUNTER — Ambulatory Visit (INDEPENDENT_AMBULATORY_CARE_PROVIDER_SITE_OTHER): Payer: Medicare Other | Admitting: Cardiovascular Disease

## 2016-11-04 ENCOUNTER — Encounter: Payer: Self-pay | Admitting: Cardiovascular Disease

## 2016-11-04 VITALS — BP 118/78 | HR 63 | Ht 69.0 in | Wt 202.0 lb

## 2016-11-04 DIAGNOSIS — I25118 Atherosclerotic heart disease of native coronary artery with other forms of angina pectoris: Secondary | ICD-10-CM | POA: Diagnosis not present

## 2016-11-04 DIAGNOSIS — Z9861 Coronary angioplasty status: Secondary | ICD-10-CM

## 2016-11-04 DIAGNOSIS — Z7901 Long term (current) use of anticoagulants: Secondary | ICD-10-CM | POA: Diagnosis not present

## 2016-11-04 DIAGNOSIS — Z955 Presence of coronary angioplasty implant and graft: Secondary | ICD-10-CM

## 2016-11-04 DIAGNOSIS — I209 Angina pectoris, unspecified: Secondary | ICD-10-CM | POA: Diagnosis not present

## 2016-11-04 DIAGNOSIS — I5033 Acute on chronic diastolic (congestive) heart failure: Secondary | ICD-10-CM

## 2016-11-04 DIAGNOSIS — I2510 Atherosclerotic heart disease of native coronary artery without angina pectoris: Secondary | ICD-10-CM | POA: Diagnosis not present

## 2016-11-04 DIAGNOSIS — I480 Paroxysmal atrial fibrillation: Secondary | ICD-10-CM

## 2016-11-04 DIAGNOSIS — R531 Weakness: Secondary | ICD-10-CM

## 2016-11-04 NOTE — Progress Notes (Signed)
SUBJECTIVE: The patient presents for follow-up of acute on chronic diastolic heart failure. He also has coronary artery disease and paroxysmal atrial fibrillation.  Labs 09/27/16: Sodium 136, potassium 3.8, BUN 18, creatinine 1.2.  He said "I feel like a new man". He denies chest pain, leg swelling, and shortness of breath. He no longer has balance issues and no longer falls.  Neurology took him off Lipitor.   Review of Systems: As per "subjective", otherwise negative.  No Known Allergies  Current Outpatient Prescriptions  Medication Sig Dispense Refill  . amiodarone (PACERONE) 200 MG tablet Take 1 tablet (200 mg total) by mouth daily. 30 tablet 0  . furosemide (LASIX) 40 MG tablet Take 1 tablet (40 mg total) by mouth daily. 90 tablet 3  . loratadine (CLARITIN) 10 MG tablet Take 10 mg by mouth daily.    . metoprolol tartrate (LOPRESSOR) 25 MG tablet Take 25 mg by mouth 2 (two) times daily.    Marland Kitchen omeprazole (PRILOSEC) 20 MG capsule Take 20 mg by mouth daily as needed (acid reflux).    . potassium chloride (K-DUR) 10 MEQ tablet Take 1 tablet (10 mEq total) by mouth daily. 90 tablet 3  . rivaroxaban (XARELTO) 20 MG TABS tablet Take 20 mg by mouth daily with supper.    . tamsulosin (FLOMAX) 0.4 MG CAPS capsule Take 0.4 mg by mouth daily.     No current facility-administered medications for this visit.     Past Medical History:  Diagnosis Date  . Atrial fibrillation ([place])   . Cancer [facility])    prostate  . Hypertension     Past Surgical History:  Procedure Laterality Date  . HERNIA REPAIR      Social History   Social History  . Marital status: Married    Spouse name: N/A  . Number of children: N/A  . Years of education: N/A   Occupational History  . Not on file.   Social History Main Topics  . Smoking status: Former Smoker    Types: Cigarettes    Quit date: 02/15/1990  . Smokeless tobacco: Never Used  . Alcohol use No  . Drug use: No  . Sexual activity: Not on  file   Other Topics Concern  . Not on file   Social History Narrative  . No narrative on file     Vitals:   11/04/16 1519  BP: 118/78  Pulse: 63  SpO2: 95%  Weight: 202 lb (91.6 kg)  Height: 5\' 9"  (1.753 m)    Wt Readings from Last 3 Encounters:  11/04/16 202 lb (91.6 kg)  09/22/16 202 lb (91.6 kg)  08/20/16 203 lb (92.1 kg)     PHYSICAL EXAM General: NAD HEENT: Normal. Neck: No JVD, no thyromegaly. Lungs: Clear to auscultation bilaterally with normal respiratory effort. CV: Nondisplaced PMI.  Regular rate and rhythm, normal S1/S2, no S3/S4, no murmur. No pretibial or periankle edema.     Abdomen: Soft, nontender, no distention.  Neurologic: Alert and oriented.  Psych: Normal affect. Skin: Normal. Musculoskeletal: No gross deformities.    ECG: Most recent ECG reviewed.   Labs: Lab Results  Component Value Date/Time   K 3.8 09/27/2016 10:17 AM   BUN 18 09/27/2016 10:17 AM   CREATININE 1.20 09/27/2016 10:17 AM   ALT 13 (L) 06/16/2016 04:19 AM   TSH 1.935 06/14/2016 05:52 PM   HGB 11.9 (L) 07/05/2016 02:49 PM     Lipids: Lab Results  Component Value Date/Time  LDLCALC 62 06/15/2016 05:01 AM   CHOL 115 06/15/2016 05:01 AM   TRIG 98 06/15/2016 05:01 AM   HDL 33 (L) 06/15/2016 05:01 AM       ASSESSMENT AND PLAN:  1. Acute on chronic diastolic heart failure: Euvolemic on Lasix 40 mg daily. No changes to therapy. Continue supplemental potassium.  2. CAD: Evidence for prior MI as detailed above. No significant ischemic territories. He is on metoprolol but is no longer Lipitor as it was discontinued by neurology. He is not on aspirin as he is taking Xarelto.  3. Paroxysmal atrial fibrillation: Currently in a regular rhythm. Continue amiodarone and Xarelto. TSH and liver transaminases were normal in April 2018.  4. Hypertension: Controlled. No changes.  5. Generalized weakness and falls: I previously made a neurology referral. He discontinued  Lipitor. His symptoms have improved.     Disposition: Follow up 3-4 months.   Kate Sable, M.D., F.A.C.C.

## 2016-11-04 NOTE — Patient Instructions (Signed)
Medication Instructions:  Continue all current medications.  Labwork: none  Testing/Procedures: none  Follow-Up: 3-4 months   Any Other Special Instructions Will Be Listed Below (If Applicable).  If you need a refill on your cardiac medications before your next appointment, please call your pharmacy.  

## 2017-02-03 ENCOUNTER — Encounter: Payer: Self-pay | Admitting: Cardiovascular Disease

## 2017-02-03 ENCOUNTER — Ambulatory Visit (INDEPENDENT_AMBULATORY_CARE_PROVIDER_SITE_OTHER): Payer: Medicare Other | Admitting: Cardiovascular Disease

## 2017-02-03 VITALS — BP 116/78 | HR 62 | Ht 69.0 in | Wt 207.0 lb

## 2017-02-03 DIAGNOSIS — Z955 Presence of coronary angioplasty implant and graft: Secondary | ICD-10-CM | POA: Diagnosis not present

## 2017-02-03 DIAGNOSIS — I209 Angina pectoris, unspecified: Secondary | ICD-10-CM | POA: Diagnosis not present

## 2017-02-03 DIAGNOSIS — Z9861 Coronary angioplasty status: Secondary | ICD-10-CM | POA: Diagnosis not present

## 2017-02-03 DIAGNOSIS — R531 Weakness: Secondary | ICD-10-CM

## 2017-02-03 DIAGNOSIS — Z7901 Long term (current) use of anticoagulants: Secondary | ICD-10-CM

## 2017-02-03 DIAGNOSIS — I5032 Chronic diastolic (congestive) heart failure: Secondary | ICD-10-CM | POA: Diagnosis not present

## 2017-02-03 DIAGNOSIS — Z79899 Other long term (current) drug therapy: Secondary | ICD-10-CM | POA: Diagnosis not present

## 2017-02-03 DIAGNOSIS — I480 Paroxysmal atrial fibrillation: Secondary | ICD-10-CM | POA: Diagnosis not present

## 2017-02-03 DIAGNOSIS — I25118 Atherosclerotic heart disease of native coronary artery with other forms of angina pectoris: Secondary | ICD-10-CM

## 2017-02-03 DIAGNOSIS — I2510 Atherosclerotic heart disease of native coronary artery without angina pectoris: Secondary | ICD-10-CM | POA: Diagnosis not present

## 2017-02-03 NOTE — Patient Instructions (Signed)
Medication Instructions:  Continue all current medications.  Labwork:  TSH, LFT - orders given today.  Office will contact with results via phone or letter.    Testing/Procedures: none  Follow-Up: Your physician wants you to follow up in: 6 months.  You will receive a reminder letter in the mail one-two months in advance.  If you don't receive a letter, please call our office to schedule the follow up appointment   Any Other Special Instructions Will Be Listed Below (If Applicable).  If you need a refill on your cardiac medications before your next appointment, please call your pharmacy.

## 2017-02-03 NOTE — Progress Notes (Signed)
SUBJECTIVE: The patient presents for follow-up of chronic diastolic heart failure, coronary artery disease and paroxysmal atrial fibrillation.  Neurology took him off Lipitor.  The patient denies any symptoms of chest pain, palpitations, shortness of breath, lightheadedness, dizziness, leg swelling, orthopnea, PND, and syncope.  He walks around the house as it has been too cold to walk outside.  He does all the housecleaning and cooking.     Review of Systems: As per "subjective", otherwise negative.  No Known Allergies  Current Outpatient Medications  Medication Sig Dispense Refill  . amiodarone (PACERONE) 200 MG tablet Take 1 tablet (200 mg total) by mouth daily. 30 tablet 0  . furosemide (LASIX) 40 MG tablet Take 1 tablet (40 mg total) by mouth daily. 90 tablet 3  . loratadine (CLARITIN) 10 MG tablet Take 10 mg by mouth daily.    . metoprolol tartrate (LOPRESSOR) 25 MG tablet Take 25 mg by mouth 2 (two) times daily.    Marland Kitchen omeprazole (PRILOSEC) 20 MG capsule Take 20 mg by mouth daily as needed (acid reflux).    . potassium chloride (K-DUR) 10 MEQ tablet Take 1 tablet (10 mEq total) by mouth daily. 90 tablet 3  . rivaroxaban (XARELTO) 20 MG TABS tablet Take 20 mg by mouth daily with supper.    . tamsulosin (FLOMAX) 0.4 MG CAPS capsule Take 0.4 mg by mouth daily.     No current facility-administered medications for this visit.     Past Medical History:  Diagnosis Date  . Atrial fibrillation ([place])   . Cancer [facility])    prostate  . Hypertension     Past Surgical History:  Procedure Laterality Date  . HERNIA REPAIR      Social History   Socioeconomic History  . Marital status: Married    Spouse name: Not on file  . Number of children: Not on file  . Years of education: Not on file  . Highest education level: Not on file  Social Needs  . Financial resource strain: Not on file  . Food insecurity - worry: Not on file  . Food insecurity - inability: Not on file    . Transportation needs - medical: Not on file  . Transportation needs - non-medical: Not on file  Occupational History  . Not on file  Tobacco Use  . Smoking status: Former Smoker    Types: Cigarettes    Last attempt to quit: 02/15/1990    Years since quitting: 26.9  . Smokeless tobacco: Never Used  Substance and Sexual Activity  . Alcohol use: No  . Drug use: No  . Sexual activity: Not on file  Other Topics Concern  . Not on file  Social History Narrative  . Not on file     Vitals:   02/03/17 1250  BP: 116/78  Pulse: 62  SpO2: 93%  Weight: 207 lb (93.9 kg)  Height: 5\' 9"  (1.753 m)    Wt Readings from Last 3 Encounters:  02/03/17 207 lb (93.9 kg)  11/04/16 202 lb (91.6 kg)  09/22/16 202 lb (91.6 kg)     PHYSICAL EXAM General: NAD HEENT: Normal. Neck: No JVD, no thyromegaly. Lungs: Clear to auscultation bilaterally with normal respiratory effort. CV: Regular rate and rhythm, normal S1/S2, no S3/S4, no murmur. No pretibial or periankle edema. Abdomen: Soft, nontender, no distention.  Neurologic: Alert and oriented.  Psych: Normal affect. Skin: Normal. Musculoskeletal: No gross deformities.    ECG: Most recent ECG reviewed.  Labs: Lab Results  Component Value Date/Time   K 3.8 09/27/2016 10:17 AM   BUN 18 09/27/2016 10:17 AM   CREATININE 1.20 09/27/2016 10:17 AM   ALT 13 (L) 06/16/2016 04:19 AM   TSH 1.935 06/14/2016 05:52 PM   HGB 11.9 (L) 07/05/2016 02:49 PM     Lipids: Lab Results  Component Value Date/Time   LDLCALC 62 06/15/2016 05:01 AM   CHOL 115 06/15/2016 05:01 AM   TRIG 98 06/15/2016 05:01 AM   HDL 33 (L) 06/15/2016 05:01 AM       ASSESSMENT AND PLAN: 1. Chronic diastolic heart failure:Symptomatically stable.  Euvolemic on Lasix 40 mg daily. No changes to therapy. Continue supplemental potassium.  2. CAD: Symptomatically stable.  Evidence for prior MI by stress test. No significant ischemic territories.He is on metoprolol but is  not taking Lipitor as it was discontinued by neurology. He is not on aspirin as he is taking Xarelto.  3. Paroxysmal atrial fibrillation: Currently in a regular rhythm. Continue amiodarone and Xarelto.TSH and liver transaminases were normal in April 2018. I will recheck these labs.  4. Hypertension: Controlled. No changes.  5.Generalized weakness and falls:I previously made a neurology referral. He discontinued Lipitor. His symptoms have improved.  He has had no falls since his last visit with me.      Disposition: Follow up 6 months   Kate Sable, M.D., F.A.C.C.

## 2017-02-04 ENCOUNTER — Telehealth: Payer: Self-pay | Admitting: Cardiovascular Disease

## 2017-02-04 NOTE — Telephone Encounter (Signed)
Patient given test results

## 2017-02-04 NOTE — Telephone Encounter (Signed)
lvm returning call

## 2017-07-09 ENCOUNTER — Encounter: Payer: Self-pay | Admitting: Cardiology

## 2017-07-22 ENCOUNTER — Telehealth: Payer: Self-pay | Admitting: *Deleted

## 2017-07-22 NOTE — Telephone Encounter (Signed)
Jesse Ovens, NP from [facility] says recent labs show hgb 10.9 and creatine levels and have been trending down from past lab work. Pt also tested positive for blood in stool - referral was made to GI - wanted to make Dr Bronson Ing aware and if Xarelto needed to be held in the meantime. Said she faxed labs over but are also in care everywhere.

## 2017-07-22 NOTE — Telephone Encounter (Signed)
Hgb not that low at the moment. I would continue it until he sees GI. It will likely then need to be held if he is to undergo endoscopy. If Hgb continues to trend down, then it would be fine to hold Xarelto.

## 2017-07-25 NOTE — Telephone Encounter (Signed)
Pt made aware and voiced understanding.

## 2017-08-11 ENCOUNTER — Telehealth (INDEPENDENT_AMBULATORY_CARE_PROVIDER_SITE_OTHER): Payer: Self-pay | Admitting: *Deleted

## 2017-08-11 ENCOUNTER — Encounter (INDEPENDENT_AMBULATORY_CARE_PROVIDER_SITE_OTHER): Payer: Self-pay | Admitting: Internal Medicine

## 2017-08-11 ENCOUNTER — Encounter (INDEPENDENT_AMBULATORY_CARE_PROVIDER_SITE_OTHER): Payer: Self-pay | Admitting: *Deleted

## 2017-08-11 ENCOUNTER — Ambulatory Visit (INDEPENDENT_AMBULATORY_CARE_PROVIDER_SITE_OTHER): Payer: Medicare Other | Admitting: Internal Medicine

## 2017-08-11 VITALS — BP 130/70 | HR 60 | Resp 18 | Ht 67.0 in | Wt 204.9 lb

## 2017-08-11 DIAGNOSIS — R195 Other fecal abnormalities: Secondary | ICD-10-CM | POA: Diagnosis not present

## 2017-08-11 MED ORDER — PEG 3350-KCL-NA BICARB-NACL 420 G PO SOLR: 4000.0000 mL | Freq: Once | ORAL | 0 refills | Status: AC

## 2017-08-11 NOTE — Telephone Encounter (Signed)
Patient aware.

## 2017-08-11 NOTE — Patient Instructions (Signed)
The risks of bleeding, perforation and infection were reviewed with patient.  

## 2017-08-11 NOTE — Progress Notes (Signed)
   Subjective:    Patient ID: Jesse Murphy, male    DOB: 10/06/1928, 84 y.o.   MRN: [id number]  HPI Referred by Myrtie Neither FNP for positive stool card. Denies seeing any blood in his stool. No change in his bowel habits. Usually goes about twice a day. No nausea or vomiting.' Appetite is good. No weight loss. His last colonoscopy was greater than 10 yrs. Maintained on Xarelto for Afib.  Hx of percutaneous coronary angioplasty.  Hx of prostate cancer.  Retired from [employer]  Past Medical History:  Diagnosis Date  . Atrial fibrillation ([place])   . Cancer [facility])    prostate  . Hypertension     Past Surgical History:  Procedure Laterality Date  . HERNIA REPAIR      No Known Allergies  Current Outpatient Medications on File Prior to Visit  Medication Sig Dispense Refill  . acetaminophen (TYLENOL) 500 MG tablet Take by mouth as needed.    Marland Kitchen amiodarone (PACERONE) 200 MG tablet Take 1 tablet (200 mg total) by mouth daily. 30 tablet 0  . fluticasone (FLONASE) 50 MCG/ACT nasal spray 1-2 sprays by Each Nare route daily.    Marland Kitchen ketoconazole (NIZORAL) 2 % cream Apply topically.    Marland Kitchen KLOR-CON M20 20 MEQ tablet Take 20 mEq by mouth daily.     Marland Kitchen loratadine (CLARITIN) 10 MG tablet Take 10 mg by mouth daily.    . metoprolol tartrate (LOPRESSOR) 25 MG tablet Take 25 mg by mouth 2 (two) times daily.    Marland Kitchen omeprazole (PRILOSEC) 20 MG capsule Take 20 mg by mouth daily as needed (acid reflux).    . rivaroxaban (XARELTO) 20 MG TABS tablet Take 20 mg by mouth daily with supper.    . tamsulosin (FLOMAX) 0.4 MG CAPS capsule Take 0.4 mg by mouth daily.    . furosemide (LASIX) 40 MG tablet Take 1 tablet (40 mg total) by mouth daily. 90 tablet 3  . potassium chloride (K-DUR) 10 MEQ tablet Take 1 tablet (10 mEq total) by mouth daily. 90 tablet 3   No current facility-administered medications on file prior to visit.            Objective:   Physical Exam Blood pressure 130/70,  pulse 60, resp. rate 18, height 5\' 7"  (1.702 m), weight 204 lb 14.4 oz (92.9 kg). Alert and oriented. Skin warm and dry. Oral mucosa is moist.   . Sclera anicteric, conjunctivae is pink. Thyroid not enlarged. No cervical lymphadenopathy. Lungs clear. Heart regular rate irregular.  Abdomen is soft. Bowel sounds are positive. No hepatomegaly. No abdominal masses felt. No tenderness.  No edema to lower extremities.           Assessment & Plan:  Blood in stool. I have recommended a colonoscopy. The risks of bleeding, perforation and infection were reviewed with patient. Will get lab work, colonoscopy report from his PCP.

## 2017-08-11 NOTE — Telephone Encounter (Signed)
Patient needs trilyte 

## 2017-08-11 NOTE — Telephone Encounter (Signed)
OK to hold Xarelto 48 hours before procedure and resume night of procedure if ok with MD.

## 2017-08-11 NOTE — Telephone Encounter (Signed)
Patient is scheduled for colonoscopy 08/25/17 -- needs to stop Xarelto 2 days piror -- please advise if ok to stop

## 2017-08-24 ENCOUNTER — Ambulatory Visit: Payer: Medicare Other | Admitting: Cardiovascular Disease

## 2017-08-25 ENCOUNTER — Encounter (HOSPITAL_COMMUNITY)
Admission: RE | Disposition: A | Discharge status: Home / Self Care | Payer: Self-pay | Source: Ambulatory Visit | Attending: Internal Medicine

## 2017-08-25 ENCOUNTER — Ambulatory Visit (HOSPITAL_COMMUNITY)
Admission: RE | Admit: 2017-08-25 | Discharge: 2017-08-25 | Disposition: A | Discharge status: Home / Self Care | Payer: Medicare Other | Source: Ambulatory Visit | Attending: Internal Medicine | Admitting: Internal Medicine

## 2017-08-25 ENCOUNTER — Encounter (HOSPITAL_COMMUNITY): Payer: Self-pay

## 2017-08-25 ENCOUNTER — Other Ambulatory Visit: Payer: Self-pay

## 2017-08-25 DIAGNOSIS — Z8249 Family history of ischemic heart disease and other diseases of the circulatory system: Secondary | ICD-10-CM | POA: Insufficient documentation

## 2017-08-25 DIAGNOSIS — D649 Anemia, unspecified: Secondary | ICD-10-CM | POA: Insufficient documentation

## 2017-08-25 DIAGNOSIS — K627 Radiation proctitis: Secondary | ICD-10-CM | POA: Diagnosis not present

## 2017-08-25 DIAGNOSIS — K644 Residual hemorrhoidal skin tags: Secondary | ICD-10-CM | POA: Insufficient documentation

## 2017-08-25 DIAGNOSIS — Z8546 Personal history of malignant neoplasm of prostate: Secondary | ICD-10-CM | POA: Insufficient documentation

## 2017-08-25 DIAGNOSIS — R195 Other fecal abnormalities: Secondary | ICD-10-CM | POA: Diagnosis not present

## 2017-08-25 DIAGNOSIS — I10 Essential (primary) hypertension: Secondary | ICD-10-CM | POA: Insufficient documentation

## 2017-08-25 DIAGNOSIS — Z87891 Personal history of nicotine dependence: Secondary | ICD-10-CM | POA: Diagnosis not present

## 2017-08-25 DIAGNOSIS — I4891 Unspecified atrial fibrillation: Secondary | ICD-10-CM | POA: Diagnosis not present

## 2017-08-25 DIAGNOSIS — K648 Other hemorrhoids: Secondary | ICD-10-CM | POA: Diagnosis not present

## 2017-08-25 DIAGNOSIS — Z79899 Other long term (current) drug therapy: Secondary | ICD-10-CM | POA: Diagnosis not present

## 2017-08-25 DIAGNOSIS — K921 Melena: Secondary | ICD-10-CM | POA: Diagnosis present

## 2017-08-25 DIAGNOSIS — Z7901 Long term (current) use of anticoagulants: Secondary | ICD-10-CM | POA: Insufficient documentation

## 2017-08-25 DIAGNOSIS — K5730 Diverticulosis of large intestine without perforation or abscess without bleeding: Secondary | ICD-10-CM

## 2017-08-25 DIAGNOSIS — K5521 Angiodysplasia of colon with hemorrhage: Secondary | ICD-10-CM | POA: Insufficient documentation

## 2017-08-25 HISTORY — PX: COLONOSCOPY: SHX5424

## 2017-08-25 HISTORY — DX: Dyspnea, unspecified: R06.00

## 2017-08-25 LAB — HEMOGLOBIN AND HEMATOCRIT, BLOOD
HCT: 34.2 % — ABNORMAL LOW (ref 39.0–52.0)
Hemoglobin: 11.1 g/dL — ABNORMAL LOW (ref 13.0–17.0)

## 2017-08-25 SURGERY — COLONOSCOPY
Anesthesia: Moderate Sedation

## 2017-08-25 MED ORDER — STERILE WATER FOR IRRIGATION IR SOLN
Status: DC | PRN
  Administered 2017-08-25: 14:00:00

## 2017-08-25 MED ORDER — MIDAZOLAM HCL 5 MG/5ML IJ SOLN
INTRAMUSCULAR | Status: DC | PRN
  Administered 2017-08-25 (×2): 1 mg via INTRAVENOUS
  Administered 2017-08-25: 2 mg via INTRAVENOUS
  Administered 2017-08-25: 1 mg via INTRAVENOUS

## 2017-08-25 MED ORDER — FLINTSTONES PLUS IRON PO CHEW: 1.0000 | CHEWABLE_TABLET | Freq: Two times a day (BID) | ORAL | Status: DC

## 2017-08-25 MED ORDER — MEPERIDINE HCL 50 MG/ML IJ SOLN
INTRAMUSCULAR | Status: DC | PRN
  Administered 2017-08-25 (×2): 25 mg via INTRAVENOUS

## 2017-08-25 MED ORDER — SODIUM CHLORIDE 0.9 % IV SOLN
INTRAVENOUS | Status: DC
  Administered 2017-08-25: 12:00:00 via INTRAVENOUS

## 2017-08-25 MED ORDER — MEPERIDINE HCL 50 MG/ML IJ SOLN
INTRAMUSCULAR | Status: AC
  Filled 2017-08-25: qty 1

## 2017-08-25 MED ORDER — MIDAZOLAM HCL 5 MG/5ML IJ SOLN
INTRAMUSCULAR | Status: AC
  Filled 2017-08-25: qty 10

## 2017-08-25 NOTE — Discharge Instructions (Signed)
Resume Xarelto on 08/28/2017. Sunday Resume other medications as before. [place] chewable with iron 1 tablet twice daily. High-fiber diet. No driving for 24 hours.   Hemorrhoids Hemorrhoids are swollen veins in and around the rectum or anus. There are two types of hemorrhoids:  Internal hemorrhoids. These occur in the veins that are just inside the rectum. They may poke through to the outside and become irritated and painful.  External hemorrhoids. These occur in the veins that are outside of the anus and can be felt as a painful swelling or hard lump near the anus.  Most hemorrhoids do not cause serious problems, and they can be managed with home treatments such as diet and lifestyle changes. If home treatments do not help your symptoms, procedures can be done to shrink or remove the hemorrhoids. What are the causes? This condition is caused by increased pressure in the anal area. This pressure may result from various things, including:  Constipation.  Straining to have a bowel movement.  Diarrhea.  Pregnancy.  Obesity.  Sitting for long periods of time.  Heavy lifting or other activity that causes you to strain.  Anal sex.  What are the signs or symptoms? Symptoms of this condition include:  Pain.  Anal itching or irritation.  Rectal bleeding.  Leakage of stool (feces).  Anal swelling.  One or more lumps around the anus.  How is this diagnosed? This condition can often be diagnosed through a visual exam. Other exams or tests may also be done, such as:  Examination of the rectal area with a gloved hand (digital rectal exam).  Examination of the anal canal using a small tube (anoscope).  A blood test, if you have lost a significant amount of blood.  A test to look inside the colon (sigmoidoscopy or colonoscopy).  How is this treated? This condition can usually be treated at home. However, various procedures may be done if dietary changes, lifestyle  changes, and other home treatments do not help your symptoms. These procedures can help make the hemorrhoids smaller or remove them completely. Some of these procedures involve surgery, and others do not. Common procedures include:  Rubber band ligation. Rubber bands are placed at the base of the hemorrhoids to cut off the blood supply to them.  Sclerotherapy. Medicine is injected into the hemorrhoids to shrink them.  Infrared coagulation. A type of light energy is used to get rid of the hemorrhoids.  Hemorrhoidectomy surgery. The hemorrhoids are surgically removed, and the veins that supply them are tied off.  Stapled hemorrhoidopexy surgery. A circular stapling device is used to remove the hemorrhoids and use staples to cut off the blood supply to them.  Follow these instructions at home: Eating and drinking  Eat foods that have a lot of fiber in them, such as whole grains, beans, nuts, fruits, and vegetables. Ask your health care provider about taking products that have added fiber (fiber supplements).  Drink enough fluid to keep your urine clear or pale yellow. Managing pain and swelling  Take warm sitz baths for 20 minutes, 3-4 times a day to ease pain and discomfort.  If directed, apply ice to the affected area. Using ice packs between sitz baths may be helpful. ? Put ice in a plastic bag. ? Place a towel between your skin and the bag. ? Leave the ice on for 20 minutes, 2-3 times a day. General instructions  Take over-the-counter and prescription medicines only as told by your health care provider.  Use  medicated creams or suppositories as told.  Exercise regularly.  Go to the bathroom when you have the urge to have a bowel movement. Do not wait.  Avoid straining to have bowel movements.  Keep the anal area dry and clean. Use wet toilet paper or moist towelettes after a bowel movement.  Do not sit on the toilet for long periods of time. This increases blood pooling and  pain. Contact a health care provider if:  You have increasing pain and swelling that are not controlled by treatment or medicine.  You have uncontrolled bleeding.  You have difficulty having a bowel movement, or you are unable to have a bowel movement.  You have pain or inflammation outside the area of the hemorrhoids. This information is not intended to replace advice given to you by your health care provider. Make sure you discuss any questions you have with your health care provider. Document Released: 01/30/2000 Document Revised: 07/02/2015 Document Reviewed: 10/16/2014 Elsevier Interactive Patient Education  [employer].

## 2017-08-25 NOTE — H&P (Signed)
Jesse Murphy is an 84 y.o. male.   Chief Complaint: Patient is here for colonoscopy. HPI: Patient is 83 year old Caucasian male who was recently found to have heme positive stool and his hemoglobin was noted to be 10.9.  Hemoglobin in May 2018 was 13.6.  He has never been screened for CRC.  He denies melena or rectal bleeding.  He also denies abdominal pain nausea vomiting or dysphagia.  He has good appetite.  He does not take OTC NSAIDs since he has been on Xarelto.  He takes Tylenol 500 mg every morning. Last Xarelto dose was 2 days ago. Family history is negative for CRC.  Past Medical History:  Diagnosis Date  . Atrial fibrillation ([place])   . Cancer [facility])    prostate  . Dyspnea   . Hypertension     Past Surgical History:  Procedure Laterality Date  . HERNIA REPAIR    . PROSTATE SURGERY      Family History  Problem Relation Age of Onset  . CAD Mother 32  . CAD Father 24  . CAD Sister   . CAD Brother    Social History:  reports that he quit smoking about 27 years ago. His smoking use included cigarettes. He has never used smokeless tobacco. He reports that he does not drink alcohol or use drugs.  Allergies: No Known Allergies  Medications Prior to Admission  Medication Sig Dispense Refill  . acetaminophen (TYLENOL) 325 MG tablet Take 325 mg by mouth every 8 (eight) hours as needed for moderate pain.     Marland Kitchen amiodarone (PACERONE) 200 MG tablet Take 1 tablet (200 mg total) by mouth daily. 30 tablet 0  . fluticasone (FLONASE) 50 MCG/ACT nasal spray 1-2 sprays by Each Nare route daily.    . furosemide (LASIX) 40 MG tablet Take 1 tablet (40 mg total) by mouth daily. 90 tablet 3  . KLOR-CON M20 20 MEQ tablet Take 20 mEq by mouth daily.     Marland Kitchen loratadine (CLARITIN) 10 MG tablet Take 10 mg by mouth daily.    . metoprolol tartrate (LOPRESSOR) 25 MG tablet Take 25 mg by mouth 2 (two) times daily.    Marland Kitchen omeprazole (PRILOSEC) 20 MG capsule Take 20 mg by mouth daily as needed (acid reflux).     . rivaroxaban (XARELTO) 20 MG TABS tablet Take 20 mg by mouth daily with supper.    . tamsulosin (FLOMAX) 0.4 MG CAPS capsule Take 0.4 mg by mouth 2 (two) times daily.     Marland Kitchen ketoconazole (NIZORAL) 2 % cream Apply 1 application topically daily as needed for irritation.     . potassium chloride (K-DUR) 10 MEQ tablet Take 1 tablet (10 mEq total) by mouth daily. (Patient not taking: Reported on 08/22/2017) 90 tablet 3    No results found for this or any previous visit (from the past 48 hour(s)). No results found.  ROS  Blood pressure (!) 154/56, pulse 64, temperature 97.7 F (36.5 C), temperature source Oral, resp. rate (!) 24, height 5\' 9"  (1.753 m), weight 205 lb (93 kg), SpO2 100 %. Physical Exam  Constitutional: He appears well-developed and well-nourished.  HENT:  Mouth/Throat: Oropharynx is clear and moist.  Eyes: Conjunctivae are normal. No scleral icterus.  Neck: No thyromegaly present.  Cardiovascular: Normal rate, regular rhythm and normal heart sounds.  No murmur heard. Respiratory: Effort normal and breath sounds normal.  GI:  Abdomen is full.  It is soft and nontender without organomegaly or masses.  Musculoskeletal:  He exhibits no edema.  Lymphadenopathy:    He has no cervical adenopathy.  Neurological: He is alert.  Skin: Skin is warm and dry.     Assessment/Plan Positive stool and anemia. Diagnostic colonoscopy.  Hildred Laser, MD 08/25/2017, 1:31 PM

## 2017-08-25 NOTE — Op Note (Signed)
[facility] Patient Name: Jesse Murphy Procedure Date: 08/25/2017 1:09 PM MRN: [id number] Date of Birth: November 03, 1928 Attending MD: Hildred Laser , MD CSN: [id number] Age: 84 Admit Type: Outpatient Procedure:                Colonoscopy Indications:              Heme positive stool Providers:                Hildred Laser, MD, Otis Peak B. Sharon Seller, RN, Aram Candela Referring MD:             Myrtie Neither, FNP Medicines:                Meperidine 50 mg IV, Midazolam 5 mg IV Complications:            No immediate complications. Estimated Blood Loss:     Estimated blood loss: none. Procedure:                Pre-Anesthesia Assessment:                           - Prior to the procedure, a History and Physical                            was performed, and patient medications and                            allergies were reviewed. The patient's tolerance of                            previous anesthesia was also reviewed. The risks                            and benefits of the procedure and the sedation                            options and risks were discussed with the patient.                            All questions were answered, and informed consent                            was obtained. Prior Anticoagulants: The patient                            last took Xarelto (rivaroxaban) 2 days prior to the                            procedure. ASA Grade Assessment: III - A patient                            with severe systemic disease. After reviewing the  risks and benefits, the patient was deemed in                            satisfactory condition to undergo the procedure.                           After obtaining informed consent, the colonoscope                            was passed under direct vision. Throughout the                            procedure, the patient's blood pressure, pulse, and                            oxygen  saturations were monitored continuously. The                            PCF-H190DL ([id number]) scope was introduced through                            the anus and advanced to the the cecum, identified                            by the ileocecal valve. The PCF-PH190L ([id number])                            scope was introduced through the and advanced to                            the. The colonoscopy was technically difficult and                            complex due to restricted mobility of the colon and                            a tortuous sigmoid colon. Successful completion of                            the procedure was aided by increasing the dose of                            sedation medication and withdrawing the scope and                            replacing with the 'babyscope'. The patient                            tolerated the procedure fairly well. The quality of                            the bowel preparation was adequate. The ileocecal  valve and the rectum were photographed. Scope In: 1:41:15 PM Scope Out: 2:36:01 PM Scope Withdrawal Time: 0 hours 16 minutes 32 seconds  Total Procedure Duration: 0 hours 54 minutes 46 seconds  Findings:      The perianal and digital rectal examinations were normal.      A few medium-mouthed diverticula were found in the sigmoid colon.      Multiple angioectasias with bleeding were found in the distal rectum.       Hemospray used to control active bleeding.      External hemorrhoids were found during retroflexion. The hemorrhoids       were small. Impression:               - Diverticulosis in the sigmoid colon.                           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                           - External hemorrhoids.                           - No specimens collected. Moderate Sedation:      Moderate (conscious) sedation was administered by  the endoscopy nurse       and supervised by the endoscopist. The following parameters were       monitored: oxygen saturation, heart rate, blood pressure, CO2       capnography and response to care. Total physician intraservice time was       60 minutes. Recommendation:           - Patient has a contact number available for                            emergencies. The signs and symptoms of potential                            delayed complications were discussed with the                            patient. Return to normal activities tomorrow.                            Written discharge instructions were provided to the                            patient.                           - High fiber diet today.                           - Continue present medications.                           - Resume Xarelto (rivaroxaban) at prior dose on  08/28/2017.                           Flintstone chewable with iron bid.                           - Repeat Flexible sigmoidoscopy if bleeding                            recurrent.                           - Will check H/H today. Procedure Code(s):        --- Professional ---                           [phone], Colonoscopy, flexible; diagnostic, including                            collection of specimen(s) by brushing or washing,                            when performed (separate procedure)                           G0500, Moderate sedation services provided by the                            same physician or other qualified health care                            professional performing a gastrointestinal                            endoscopic service that sedation supports,                            requiring the presence of an independent trained                            observer to assist in the monitoring of the                            patient's level of consciousness and physiological                            status;  initial 15 minutes of intra-service time;                            patient age 52 years or older (additional time may                            be reported with [phone], as appropriate)                           [phone], Moderate sedation services provided by the  same physician or other qualified health care                            professional performing the diagnostic or                            therapeutic service that the sedation supports,                            requiring the presence of an independent trained                            observer to assist in the monitoring of the                            patient's level of consciousness and physiological                            status; each additional 15 minutes intraservice                            time (List separately in addition to code for                            primary service)                           [phone], Moderate sedation services provided by the                            same physician or other qualified health care                            professional performing the diagnostic or                            therapeutic service that the sedation supports,                            requiring the presence of an independent trained                            observer to assist in the monitoring of the                            patient's level of consciousness and physiological                            status; each additional 15 minutes intraservice                            time (List separately in addition to code for  primary service)                           [phone], Moderate sedation services provided by the                            same physician or other qualified health care                            professional performing the diagnostic or                            therapeutic service that the sedation supports,                             requiring the presence of an independent trained                            observer to assist in the monitoring of the                            patient's level of consciousness and physiological                            status; each additional 15 minutes intraservice                            time (List separately in addition to code for                            primary service) Diagnosis Code(s):        --- Professional ---                           K64.4, Residual hemorrhoidal skin tags                           K55.21, Angiodysplasia of colon with hemorrhage                           R19.5, Other fecal abnormalities                           K57.30, Diverticulosis of large intestine without                            perforation or abscess without bleeding CPT copyright 2017 American Medical Association. All rights reserved. The codes documented in this report are preliminary and upon coder review may  be revised to meet current compliance requirements. Hildred Laser, MD Hildred Laser, MD 08/25/2017 2:57:43 PM This report has been signed electronically. Number of Addenda: 0

## 2017-08-30 ENCOUNTER — Encounter (HOSPITAL_COMMUNITY): Payer: Self-pay | Admitting: Internal Medicine

## 2017-09-30 ENCOUNTER — Inpatient Hospital Stay (HOSPITAL_COMMUNITY): Payer: Medicare Other

## 2017-09-30 ENCOUNTER — Encounter (HOSPITAL_COMMUNITY): Payer: Self-pay | Admitting: Hematology

## 2017-09-30 ENCOUNTER — Inpatient Hospital Stay (HOSPITAL_COMMUNITY): Payer: Medicare Other | Attending: Hematology | Admitting: Hematology

## 2017-09-30 ENCOUNTER — Other Ambulatory Visit: Payer: Self-pay

## 2017-09-30 VITALS — BP 138/48 | HR 66 | Temp 97.9°F | Resp 20 | Ht 67.0 in | Wt 207.5 lb

## 2017-09-30 DIAGNOSIS — I4891 Unspecified atrial fibrillation: Secondary | ICD-10-CM

## 2017-09-30 DIAGNOSIS — Z87891 Personal history of nicotine dependence: Secondary | ICD-10-CM | POA: Insufficient documentation

## 2017-09-30 DIAGNOSIS — Z923 Personal history of irradiation: Secondary | ICD-10-CM | POA: Diagnosis not present

## 2017-09-30 DIAGNOSIS — Z79899 Other long term (current) drug therapy: Secondary | ICD-10-CM | POA: Insufficient documentation

## 2017-09-30 DIAGNOSIS — C8590 Non-Hodgkin lymphoma, unspecified, unspecified site: Secondary | ICD-10-CM | POA: Insufficient documentation

## 2017-09-30 DIAGNOSIS — C8511 Unspecified B-cell lymphoma, lymph nodes of head, face, and neck: Secondary | ICD-10-CM

## 2017-09-30 DIAGNOSIS — Z8546 Personal history of malignant neoplasm of prostate: Secondary | ICD-10-CM | POA: Diagnosis not present

## 2017-09-30 DIAGNOSIS — I10 Essential (primary) hypertension: Secondary | ICD-10-CM

## 2017-09-30 LAB — COMPREHENSIVE METABOLIC PANEL
ALT: 26 U/L (ref 0–44)
AST: 27 U/L (ref 15–41)
Albumin: 3.8 g/dL (ref 3.5–5.0)
Alkaline Phosphatase: 57 U/L (ref 38–126)
Anion gap: 8 (ref 5–15)
BUN: 18 mg/dL (ref 8–23)
CO2: 30 mmol/L (ref 22–32)
Calcium: 8.9 mg/dL (ref 8.9–10.3)
Chloride: 98 mmol/L (ref 98–111)
Creatinine, Ser: 1.12 mg/dL (ref 0.61–1.24)
GFR calc Af Amer: >60 mL/min (ref >60)
GFR calc non Af Amer: 57 mL/min — ABNORMAL LOW (ref >60)
Glucose, Bld: 127 mg/dL — ABNORMAL HIGH (ref 70–99)
Potassium: 4 mmol/L (ref 3.5–5.1)
Sodium: 136 mmol/L (ref 135–145)
Total Bilirubin: 0.7 mg/dL (ref 0.3–1.2)
Total Protein: 7.5 g/dL (ref 6.5–8.1)

## 2017-09-30 LAB — CBC WITH DIFFERENTIAL/PLATELET
Basophils Absolute: 0 10*3/uL (ref 0.0–0.1)
Basophils Relative: 0 %
Eosinophils Absolute: 0.2 10*3/uL (ref 0.0–0.7)
Eosinophils Relative: 3 %
HCT: 36.3 % — ABNORMAL LOW (ref 39.0–52.0)
Hemoglobin: 11.3 g/dL — ABNORMAL LOW (ref 13.0–17.0)
Lymphocytes Relative: 16 %
Lymphs Abs: 1.1 10*3/uL (ref 0.7–4.0)
MCH: 27.2 pg (ref 26.0–34.0)
MCHC: 31.1 g/dL (ref 30.0–36.0)
MCV: 87.5 fL (ref 78.0–100.0)
Monocytes Absolute: 0.8 10*3/uL (ref 0.1–1.0)
Monocytes Relative: 12 %
Neutro Abs: 4.4 10*3/uL (ref 1.7–7.7)
Neutrophils Relative %: 69 %
Platelets: 208 10*3/uL (ref 150–400)
RBC: 4.15 MIL/uL — ABNORMAL LOW (ref 4.22–5.81)
RDW: 14.9 % (ref 11.5–15.5)
WBC: 6.5 10*3/uL (ref 4.0–10.5)

## 2017-09-30 LAB — LACTATE DEHYDROGENASE: LDH: 130 U/L (ref 98–192)

## 2017-09-30 NOTE — Assessment & Plan Note (Addendum)
1.  B-cell lymphoma of the left scalp: - Reports erythematous area on the left scalp for the last 6 to 7 months with no itching or bleeding. -Seen by Dr. [place] Crane who did biopsy of the erythematous area on 09/22/2017 anteriorly and posteriorly which came back as atypical B-cell infiltrate concerning for B-cell lymphoma, possibly follicular type.  CD20 was strongly positive. -Patient does not have any B symptoms including fevers, night sweats or weight loss. -Physical examination did not reveal any palpable adenopathy or hepatosplenomegaly. - I have recommended checking his blood work including LDH level.  We will also order a whole-body PET CT scan for further staging.  Depending on the scan results, he might need further lymph node biopsy as well as bone marrow biopsy. - We will see him back after the scans and blood work.  2.  Prostate cancer: - Diagnosed 3 to 4 years ago, status post radiation therapy at [facility].

## 2017-09-30 NOTE — Progress Notes (Signed)
[facility] NOTE  Patient Care Team: Sandi Mealy, MD as PCP - General (Family Medicine)  CHIEF COMPLAINTS/PURPOSE OF CONSULTATION:  B-cell lymphoma of the scalp.  HISTORY OF PRESENTING ILLNESS:  Jesse Murphy 84 y.o. male is seen in consultation today for further work-up and management of B-cell lymphoma of the scalp.  He has noticed pinkish discoloration on the left scalp region for the last 6 to 7 months.  He did not experience any itching or bleeding in that area.  No pain was reported.  He was seen by Dr. [place] Crane who did a biopsy of that area both anteriorly and posteriorly.  This showed atypical B-cell infiltrate.  CD20 was strongly positive.  This was concerning for B-cell lymphoma, possibly follicular type.  He denies any fevers, night sweats or weight loss.  He denies palpating any lumps or bumps on his body.  Denies any recurrent infections or hospitalizations.  He lives at home with his wife and is independent of all ADLs and IADLs.  He had a history of prostate cancer 3 to 4 years ago, underwent radiation therapy at [facility].  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  Does not report any exposure to chemicals or pesticides.  No family history of malignancies noted.  MEDICAL HISTORY:  Past Medical History:  Diagnosis Date  . Atrial fibrillation ([place])   . Cancer [facility])    prostate  . Dyspnea   . Hypertension     SURGICAL HISTORY: Past Surgical History:  Procedure Laterality Date  . COLONOSCOPY N/A 08/25/2017   Procedure: COLONOSCOPY;  Surgeon: Rogene Houston, MD;  Location: [facility];  Service: Endoscopy;  Laterality: N/A;  . HERNIA REPAIR    . PROSTATE SURGERY      SOCIAL HISTORY: Social History   Socioeconomic History  . Marital status: Married    Spouse name: Not on file  . Number of children: 2  . Years of education: Not on file  . Highest education level: Not on file  Occupational History  .  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 Financial resource strain: Not hard at all  . Food insecurity:    Worry: Never true    Inability: Never true  . Transportation needs:    Medical: No    Non-medical: No  Tobacco Use  . Smoking status: Former Smoker    Packs/day: 0.25    Years: 50.00    Pack years: 12.50    Types: Cigarettes    Last attempt to quit: 02/15/1990    Years since quitting: 27.6  . Smokeless tobacco: Never Used  Substance and Sexual Activity  . Alcohol use: No  . Drug use: No  . Sexual activity: Not Currently  Lifestyle  . Physical activity:    Days per week: 0 days    Minutes per session: 0 min  . Stress: Not at all  Relationships  . Social connections:    Talks on phone: Once a week    Gets together: More than three times a week    Attends religious service: 1 to 4 times per year    Active member of club or organization: No    Attends meetings of clubs or organizations: Never    Relationship status: Married  . Intimate partner violence:    Fear of current or ex partner: No    Emotionally abused: No    Physically  abused: No    Forced sexual activity: No  Other Topics Concern  . Not on file  Social History Narrative  . Not on file    FAMILY HISTORY: Family History  Problem Relation Age of Onset  . CAD Mother 20  . CAD Father 74  . CAD Sister   . CAD Brother     ALLERGIES:  has No Known Allergies.  MEDICATIONS:  Current Outpatient Medications  Medication Sig Dispense Refill  . acetaminophen (TYLENOL) 325 MG tablet Take 325 mg by mouth every 8 (eight) hours as needed for moderate pain.     Marland Kitchen amiodarone (PACERONE) 200 MG tablet Take 1 tablet (200 mg total) by mouth daily. 30 tablet 0  . desonide (DESOWEN) 0.05 % cream     . fluticasone (FLONASE) 50 MCG/ACT nasal spray 1-2 sprays by Each Nare route daily.    Marland Kitchen ketoconazole (NIZORAL) 2 % cream Apply 1 application topically daily as needed for irritation.     Marland Kitchen  KLOR-CON M20 20 MEQ tablet Take 20 mEq by mouth daily.     Marland Kitchen loratadine (CLARITIN) 10 MG tablet Take 10 mg by mouth daily.    . metoprolol tartrate (LOPRESSOR) 25 MG tablet Take 25 mg by mouth 2 (two) times daily.    Marland Kitchen omeprazole (PRILOSEC) 20 MG capsule Take 20 mg by mouth daily as needed (acid reflux).    . Pediatric Multivitamins-Iron (FLINTSTONES PLUS IRON) chewable tablet Chew 1 tablet by mouth 2 (two) times daily.    . Polyvinyl Alcohol-Povidone PF 1.4-0.6 % SOLN     . rivaroxaban (XARELTO) 20 MG TABS tablet Take 1 tablet (20 mg total) by mouth daily with supper.    . tamsulosin (FLOMAX) 0.4 MG CAPS capsule Take 0.4 mg by mouth 2 (two) times daily.     . furosemide (LASIX) 40 MG tablet Take 1 tablet (40 mg total) by mouth daily. 90 tablet 3   No current facility-administered medications for this visit.     REVIEW OF SYSTEMS:   Constitutional: Denies fevers, chills or abnormal night sweats.  Mild fatigue present. Eyes: Denies blurriness of vision, double vision or watery eyes Ears, nose, mouth, throat, and face: Denies mucositis or sore throat Respiratory: Denies cough, dyspnea or wheezes Cardiovascular: Denies palpitation, chest discomfort or lower extremity swelling Gastrointestinal:  Denies nausea, heartburn or change in bowel habits Skin: Denies abnormal skin rashes.  Occasional bruising present. Lymphatics: Denies new lymphadenopathy or easy bruising Neurological:Denies numbness, tingling or new weaknesses Behavioral/Psych: Mood is stable, no new changes  All other systems were reviewed with the patient and are negative.  PHYSICAL EXAMINATION: ECOG PERFORMANCE STATUS: 1 - Symptomatic but completely ambulatory  Vitals:   09/30/17 0833  BP: (!) 138/48  Pulse: 66  Resp: 20  Temp: 97.9 F (36.6 C)  SpO2: 97%   Filed Weights   09/30/17 0833  Weight: 207 lb 8 oz (94.1 kg)    GENERAL:alert, no distress and comfortable SKIN: There is an erythematous area measuring couple  of inches on the left temporal scalp area.  Scabs from recent biopsy found. EYES: normal, conjunctiva are pink and non-injected, sclera clear OROPHARYNX:no exudate, no erythema and lips, buccal mucosa, and tongue normal  NECK: supple, thyroid normal size, non-tender, without nodularity LYMPH:  no palpable lymphadenopathy in the cervical, axillary or inguinal LUNGS: clear to auscultation and percussion with normal breathing effort HEART: regular rate & rhythm and no murmurs and no lower extremity edema ABDOMEN:abdomen soft, non-tender and normal bowel  sounds Musculoskeletal:no cyanosis of digits and no clubbing  PSYCH: alert & oriented x 3 with fluent speech NEURO: no focal motor/sensory deficits  LABORATORY DATA:  I have reviewed the data as listed Lab Results  Component Value Date   WBC 6.5 09/30/2017   HGB 11.3 (L) 09/30/2017   HCT 36.3 (L) 09/30/2017   MCV 87.5 09/30/2017   PLT 208 09/30/2017     Chemistry      Component Value Date/Time   NA 136 09/30/2017 0924   K 4.0 09/30/2017 0924   CL 98 09/30/2017 0924   CO2 30 09/30/2017 0924   BUN 18 09/30/2017 0924   CREATININE 1.12 09/30/2017 0924      Component Value Date/Time   CALCIUM 8.9 09/30/2017 0924   ALKPHOS 57 09/30/2017 0924   AST 27 09/30/2017 0924   ALT 26 09/30/2017 0924   BILITOT 0.7 09/30/2017 0924       RADIOGRAPHIC STUDIES: I have reviewed his chest x-ray from 08/20/2016 which shows cardiomegaly.  ASSESSMENT & PLAN:  Non Hodgkin's lymphoma ([place]) 1.  B-cell lymphoma of the left scalp: - Reports erythematous area on the left scalp for the last 6 to 7 months with no itching or bleeding. -Seen by Dr. [place] Crane who did biopsy of the erythematous area anteriorly and posteriorly which came back as atypical B-cell infiltrate concerning for B-cell lymphoma, possibly follicular type.  CD20 was strongly positive. -Patient does not have any B symptoms including fevers, night sweats or weight loss. -Physical examination  did not reveal any palpable adenopathy or hepatosplenomegaly. - I have recommended checking his blood work including LDH level.  We will also order a whole-body PET CT scan for further staging.  Depending on the scan results, he might need further lymph node biopsy as well as bone marrow biopsy. - We will see him back after the scans and blood work.  Orders Placed This Encounter  Procedures  . NM PET Image Initial (PI) Whole Body    Standing Status:   Future    Standing Expiration Date:   09/30/2018    Order Specific Question:   ** REASON FOR EXAM (FREE TEXT)    Answer:   lymphoma    Order Specific Question:   If indicated for the ordered procedure, I authorize the administration of a radiopharmaceutical per Radiology protocol    Answer:   Yes    Order Specific Question:   Preferred imaging location?    Answer:   [facility]    Order Specific Question:   Radiology Contrast Protocol - do NOT remove file path    Answer:   [email or web address]  . CBC with Differential/Platelet  . Comprehensive metabolic panel  . Lactate dehydrogenase    Standing Status:   Future    Number of Occurrences:   1    Standing Expiration Date:   09/30/2018  . Hepatitis panel, acute    Standing Status:   Future    Number of Occurrences:   1    Standing Expiration Date:   09/30/2018  . CBC with Differential/Platelet    Standing Status:   Future    Number of Occurrences:   1    Standing Expiration Date:   10/01/2018    All questions were answered. The patient knows to call the clinic with any problems, questions or concerns.      Derek Jack, MD 09/30/2017 1:05 PM

## 2017-09-30 NOTE — Patient Instructions (Signed)
[place] at [facility] Discharge Instructions  Follow up after your PET scan. Please have labs drawn today.    Thank you for choosing [place] at [facility] to provide your oncology and hematology care.  To afford each patient quality time with our provider, please arrive at least 15 minutes before your scheduled appointment time.   If you have a lab appointment with the [facility] please come in thru the  Main Entrance and check in at the main information desk  You need to re-schedule your appointment should you arrive 10 or more minutes late.  We strive to give you quality time with our providers, and arriving late affects you and other patients whose appointments are after yours.  Also, if you no show three or more times for appointments you may be dismissed from the clinic at the providers discretion.     Again, [facility] you for choosing [facility].  Our hope is that these requests will decrease the amount of time that you wait before being seen by our physicians.       _____________________________________________________________  Should you have questions after your visit to [facility], please contact our office at [phone] between the hours of 8:00 a.m. and 4:30 p.m.  Voicemails left after 4:00 p.m. will not be returned until the following business day.  For prescription refill requests, have your pharmacy contact our office and allow 72 hours.    Cancer Center Support Programs:   > Cancer Support Group  2nd Tuesday of the month 1pm-2pm, Journey Room

## 2017-10-01 LAB — HEPATITIS PANEL, ACUTE
HCV Ab: 0.1 s/co ratio (ref 0.0–0.9)
Hep A IgM: NEGATIVE
Hep B C IgM: NEGATIVE
Hepatitis B Surface Ag: NEGATIVE

## 2017-10-31 ENCOUNTER — Ambulatory Visit (HOSPITAL_COMMUNITY)
Admission: RE | Admit: 2017-10-31 | Discharge: 2017-10-31 | Disposition: A | Discharge status: Home / Self Care | Payer: Medicare Other | Source: Ambulatory Visit | Attending: Nurse Practitioner | Admitting: Nurse Practitioner

## 2017-10-31 ENCOUNTER — Ambulatory Visit: Payer: Medicare Other | Admitting: Cardiovascular Disease

## 2017-10-31 DIAGNOSIS — K4090 Unilateral inguinal hernia, without obstruction or gangrene, not specified as recurrent: Secondary | ICD-10-CM | POA: Diagnosis not present

## 2017-10-31 DIAGNOSIS — C8590 Non-Hodgkin lymphoma, unspecified, unspecified site: Secondary | ICD-10-CM | POA: Diagnosis not present

## 2017-10-31 DIAGNOSIS — B9689 Other specified bacterial agents as the cause of diseases classified elsewhere: Secondary | ICD-10-CM | POA: Diagnosis not present

## 2017-10-31 DIAGNOSIS — J439 Emphysema, unspecified: Secondary | ICD-10-CM | POA: Insufficient documentation

## 2017-10-31 DIAGNOSIS — M47816 Spondylosis without myelopathy or radiculopathy, lumbar region: Secondary | ICD-10-CM | POA: Diagnosis not present

## 2017-10-31 DIAGNOSIS — N433 Hydrocele, unspecified: Secondary | ICD-10-CM | POA: Diagnosis not present

## 2017-10-31 DIAGNOSIS — J320 Chronic maxillary sinusitis: Secondary | ICD-10-CM | POA: Diagnosis not present

## 2017-10-31 DIAGNOSIS — I700 Atherosclerosis of aorta: Secondary | ICD-10-CM | POA: Insufficient documentation

## 2017-10-31 DIAGNOSIS — I2510 Atherosclerotic heart disease of native coronary artery without angina pectoris: Secondary | ICD-10-CM | POA: Diagnosis not present

## 2017-10-31 DIAGNOSIS — R932 Abnormal findings on diagnostic imaging of liver and biliary tract: Secondary | ICD-10-CM | POA: Insufficient documentation

## 2017-10-31 MED ORDER — FLUDEOXYGLUCOSE F - 18 (FDG) INJECTION
15.0000 | Freq: Once | INTRAVENOUS | Status: AC | PRN
  Administered 2017-10-31: 12.47 via INTRAVENOUS

## 2017-11-08 ENCOUNTER — Ambulatory Visit (HOSPITAL_COMMUNITY): Payer: Medicare Other | Admitting: Hematology

## 2017-11-09 ENCOUNTER — Ambulatory Visit (HOSPITAL_COMMUNITY): Payer: Medicare Other | Admitting: Hematology

## 2017-11-16 NOTE — Progress Notes (Signed)
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 male returns for routine follow-up for B-cell lymphoma of the scalp. Patient is here today with his wife. He is doing well. He has no complaints. He lives closer to [place] and wishes to have radiation there. His appetite and energy levels are good. He denies any nausea, vomiting, or diarrhea. Denies any headaches or vision changes. Denies any new pains or lumps. Denies any SOB.    REVIEW OF SYSTEMS:  Review of Systems  All other systems reviewed and are negative.    PAST MEDICAL/SURGICAL HISTORY:  Past Medical History:  Diagnosis Date  . Atrial fibrillation ([place])   . Cancer [facility])    prostate  . Dyspnea   . Hypertension    Past Surgical History:  Procedure Laterality Date  . COLONOSCOPY N/A 08/25/2017   Procedure: COLONOSCOPY;  Surgeon: Rogene Houston, MD;  Location: [facility] ENDO SUITE;  Service: Endoscopy;  Laterality: N/A;  . HERNIA REPAIR    . PROSTATE SURGERY       SOCIAL HISTORY:  Social History   Socioeconomic History  . Marital status: Married    Spouse name: Not on file  . Number of children: 2  . Years of education: Not on file  . Highest education level: Not on file  Occupational History  . 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 Financial resource strain: Not hard at all  . Food insecurity:    Worry: Never true    Inability: Never true  . Transportation needs:    Medical: No    Non-medical: No  Tobacco Use  . Smoking status: Former Smoker    Packs/day: 0.25    Years: 50.00    Pack years: 12.50    Types: Cigarettes    Last attempt to quit: 02/15/1990    Years since quitting: 27.7    . Smokeless tobacco: Never Used  Substance and Sexual Activity  . Alcohol use: No  . Drug use: No  . Sexual activity: Not Currently  Lifestyle  . Physical activity:    Days per week: 0 days    Minutes per session: 0 min  . Stress: Not at all  Relationships  . Social connections:    Talks on phone: Once a week    Gets together: More than three times a week    Attends religious service: 1 to 4 times per year    Active member of club or organization: No    Attends meetings of clubs or organizations: Never    Relationship status: Married  . Intimate partner violence:    Fear of current or ex partner: No    Emotionally abused: No    Physically abused: No    Forced sexual activity: No  Other Topics Concern  . Not on file  Social History Narrative  . Not on file    FAMILY HISTORY:  Family History  Problem Relation Age of Onset  . CAD Mother 6  . CAD Father 41  . CAD Sister   . CAD Brother     CURRENT MEDICATIONS:  Outpatient Encounter Medications as of 11/17/2017  Medication Sig  .  acetaminophen (TYLENOL) 325 MG tablet Take 325 mg by mouth every 8 (eight) hours as needed for moderate pain.   Marland Kitchen amiodarone (PACERONE) 200 MG tablet Take 1 tablet (200 mg total) by mouth daily.  Marland Kitchen desonide (DESOWEN) 0.05 % cream   . fluticasone (FLONASE) 50 MCG/ACT nasal spray 1-2 sprays by Each Nare route daily.  . furosemide (LASIX) 20 MG tablet   . ketoconazole (NIZORAL) 2 % cream Apply 1 application topically daily as needed for irritation.   Marland Kitchen KLOR-CON M20 20 MEQ tablet Take 20 mEq by mouth daily.   Marland Kitchen loratadine (CLARITIN) 10 MG tablet Take 10 mg by mouth daily.  . metoprolol tartrate (LOPRESSOR) 25 MG tablet Take 25 mg by mouth 2 (two) times daily.  Marland Kitchen omeprazole (PRILOSEC) 20 MG capsule Take 20 mg by mouth daily as needed (acid reflux).  . Pediatric Multivitamins-Iron (FLINTSTONES PLUS IRON) chewable tablet Chew 1 tablet by mouth 2 (two) times daily.  . Polyvinyl Alcohol-Povidone PF  1.4-0.6 % SOLN   . rivaroxaban (XARELTO) 20 MG TABS tablet Take 1 tablet (20 mg total) by mouth daily with supper.  . tamsulosin (FLOMAX) 0.4 MG CAPS capsule Take 0.4 mg by mouth 2 (two) times daily.   . [DISCONTINUED] furosemide (LASIX) 40 MG tablet Take 1 tablet (40 mg total) by mouth daily.   No facility-administered encounter medications on file as of 11/17/2017.     ALLERGIES:  No Known Allergies   PHYSICAL EXAM:  ECOG Performance status: 1  Vitals:   11/17/17 0917  BP: (!) 131/52  Pulse: 61  Resp: 20  Temp: (!) 97.5 F (36.4 C)  SpO2: 98%   Filed Weights   11/17/17 0917  Weight: 204 lb 1.6 oz (92.6 kg)    Physical Exam  Constitutional: He is oriented to person, place, and time. He appears well-developed and well-nourished.  Cardiovascular: Normal rate, regular rhythm and normal heart sounds.  Pulmonary/Chest: Effort normal and breath sounds normal.  Musculoskeletal: Normal range of motion.  Neurological: He is alert and oriented to person, place, and time.  Skin: Skin is warm and dry.  Psychiatric: He has a normal mood and affect. His behavior is normal. Judgment and thought content normal.     LABORATORY DATA:  I have reviewed the labs as listed.  CBC    Component Value Date/Time   WBC 6.5 09/30/2017 0924   RBC 4.15 (L) 09/30/2017 0924   HGB 11.3 (L) 09/30/2017 0924   HCT 36.3 (L) 09/30/2017 0924   PLT 208 09/30/2017 0924   MCV 87.5 09/30/2017 0924   MCH 27.2 09/30/2017 0924   MCHC 31.1 09/30/2017 0924   RDW 14.9 09/30/2017 0924   LYMPHSABS 1.1 09/30/2017 0924   MONOABS 0.8 09/30/2017 0924   EOSABS 0.2 09/30/2017 0924   BASOSABS 0.0 09/30/2017 0924   CMP Latest Ref Rng & Units 09/30/2017 09/27/2016 06/16/2016  Glucose 70 - 99 mg/dL 127(H) 212(H) 133(H)  BUN 8 - 23 mg/dL 18 18 15   Creatinine 0.61 - 1.24 mg/dL 1.12 1.20 0.95  Sodium 135 - 145 mmol/L 136 136 134(L)  Potassium 3.5 - 5.1 mmol/L 4.0 3.8 3.7  Chloride 98 - 111 mmol/L 98 96(L) 99(L)  CO2 22  - 32 mmol/L 30 29 27   Calcium 8.9 - 10.3 mg/dL 8.9 8.9 8.5(L)  Total Protein 6.5 - 8.1 g/dL 7.5 - 6.7  Total Bilirubin 0.3 - 1.2 mg/dL 0.7 - 1.1  Alkaline Phos 38 - 126 U/L 57 - 40  AST 15 -  41 U/L 27 - 19  ALT 0 - 44 U/L 26 - 13(L)       DIAGNOSTIC IMAGING:  I have reviewed images of the PET CT scan dated 10/31/2017 independently and discussed with the patient..      ASSESSMENT & PLAN:   Non Hodgkin's lymphoma ([place]) 1.  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 -Seen by Dr. Nevada Crane who did biopsy of the erythematous area on 09/22/2017 anteriorly and posteriorly which came back as atypical B-cell infiltrate concerning for B-cell lymphoma, possibly follicular type.  CD20 was strongly positive. -Patient does not have any B symptoms including fevers, night sweats or weight loss. -Physical examination did not reveal any palpable adenopathy or hepatosplenomegaly. - I have reviewed PET CT scan images from 10/31/2017 and discussed with the patient.  Left frontal scalp area shows hypermetabolism with SUV of 3.3.  High density of the liver was seen.  No other hypermetabolic activity suggesting lymphoma involvement.  LDH was normal. - He has a stage I lymphoma, which is low-grade.  I have recommended radiation consultation for localized radiation.  We will refer him to [place].  I will see him back in 3 months for follow-up.  2.  Prostate cancer: - Diagnosed 3 to 4 years ago, status post radiation therapy at [facility].      Orders placed this encounter:  Orders Placed This Encounter  Procedures  . Lactate dehydrogenase  . CBC with Differential/Platelet  . Comprehensive metabolic panel      Derek Jack, MD [place] [phone]

## 2017-11-17 ENCOUNTER — Other Ambulatory Visit: Payer: Self-pay

## 2017-11-17 ENCOUNTER — Inpatient Hospital Stay (HOSPITAL_COMMUNITY): Payer: Medicare Other | Attending: Hematology | Admitting: Hematology

## 2017-11-17 ENCOUNTER — Encounter (HOSPITAL_COMMUNITY): Payer: Self-pay | Admitting: Hematology

## 2017-11-17 VITALS — BP 131/52 | HR 61 | Temp 97.5°F | Resp 20 | Wt 204.1 lb

## 2017-11-17 DIAGNOSIS — I4891 Unspecified atrial fibrillation: Secondary | ICD-10-CM | POA: Insufficient documentation

## 2017-11-17 DIAGNOSIS — Z79899 Other long term (current) drug therapy: Secondary | ICD-10-CM | POA: Diagnosis not present

## 2017-11-17 DIAGNOSIS — I10 Essential (primary) hypertension: Secondary | ICD-10-CM | POA: Diagnosis not present

## 2017-11-17 DIAGNOSIS — C8590 Non-Hodgkin lymphoma, unspecified, unspecified site: Secondary | ICD-10-CM

## 2017-11-17 DIAGNOSIS — Z87891 Personal history of nicotine dependence: Secondary | ICD-10-CM | POA: Diagnosis not present

## 2017-11-17 DIAGNOSIS — Z7901 Long term (current) use of anticoagulants: Secondary | ICD-10-CM | POA: Diagnosis not present

## 2017-11-17 DIAGNOSIS — C8511 Unspecified B-cell lymphoma, lymph nodes of head, face, and neck: Secondary | ICD-10-CM | POA: Insufficient documentation

## 2017-11-17 DIAGNOSIS — Z8546 Personal history of malignant neoplasm of prostate: Secondary | ICD-10-CM | POA: Insufficient documentation

## 2017-11-17 DIAGNOSIS — Z923 Personal history of irradiation: Secondary | ICD-10-CM | POA: Insufficient documentation

## 2017-11-17 NOTE — Assessment & Plan Note (Signed)
1.  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 -Seen by Dr. Nevada Crane who did biopsy of the erythematous area on 09/22/2017 anteriorly and posteriorly which came back as atypical B-cell infiltrate concerning for B-cell lymphoma, possibly follicular type.  CD20 was strongly positive. -Patient does not have any B symptoms including fevers, night sweats or weight loss. -Physical examination did not reveal any palpable adenopathy or hepatosplenomegaly. - I have reviewed PET CT scan images from 10/31/2017 and discussed with the patient.  Left frontal scalp area shows hypermetabolism with SUV of 3.3.  High density of the liver was seen.  No other hypermetabolic activity suggesting lymphoma involvement.  LDH was normal. - He has a stage I lymphoma, which is low-grade.  I have recommended radiation consultation for localized radiation.  We will refer him to [place].  I will see him back in 3 months for follow-up.  2.  Prostate cancer: - Diagnosed 3 to 4 years ago, status post radiation therapy at [facility].

## 2017-11-17 NOTE — Patient Instructions (Signed)
[facility] Discharge Instructions  Follow up in 3 months with labs prior to your visit.    Thank you for choosing [facility] at [facility] to provide your oncology and hematology care.  To afford each patient quality time with our provider, please arrive at least 15 minutes before your scheduled appointment time.   If you have a lab appointment with the Cancer Center please come in thru the  Main Entrance and check in at the main information desk  You need to re-schedule your appointment should you arrive 10 or more minutes late.  We strive to give you quality time with our providers, and arriving late affects you and other patients whose appointments are after yours.  Also, if you no show three or more times for appointments you may be dismissed from the clinic at the providers discretion.     Again, thank you for choosing [facility].  Our hope is that these requests will decrease the amount of time that you wait before being seen by our physicians.       _____________________________________________________________  Should you have questions after your visit to  Cancer Center, please contact our office at [phone] between the hours of 8:00 a.m. and 4:30 p.m.  Voicemails left after 4:00 p.m. will not be returned until the following business day.  For prescription refill requests, have your pharmacy contact our office and allow 72 hours.    Cancer Center Support Programs:   > Cancer Support Group  2nd Tuesday of the month 1pm-2pm, Journey Room    

## 2017-12-12 ENCOUNTER — Ambulatory Visit: Payer: Medicare Other | Admitting: Cardiovascular Disease

## 2017-12-15 ENCOUNTER — Encounter: Payer: Self-pay | Admitting: Cardiovascular Disease

## 2017-12-15 ENCOUNTER — Encounter

## 2017-12-15 ENCOUNTER — Telehealth: Payer: Self-pay | Admitting: Cardiovascular Disease

## 2017-12-15 ENCOUNTER — Ambulatory Visit (INDEPENDENT_AMBULATORY_CARE_PROVIDER_SITE_OTHER): Payer: Medicare Other | Admitting: Cardiovascular Disease

## 2017-12-15 VITALS — BP 152/70 | HR 73 | Ht 69.0 in | Wt 205.0 lb

## 2017-12-15 DIAGNOSIS — R0609 Other forms of dyspnea: Secondary | ICD-10-CM

## 2017-12-15 DIAGNOSIS — I5032 Chronic diastolic (congestive) heart failure: Secondary | ICD-10-CM

## 2017-12-15 DIAGNOSIS — I25118 Atherosclerotic heart disease of native coronary artery with other forms of angina pectoris: Secondary | ICD-10-CM

## 2017-12-15 DIAGNOSIS — I480 Paroxysmal atrial fibrillation: Secondary | ICD-10-CM | POA: Diagnosis not present

## 2017-12-15 DIAGNOSIS — Z79899 Other long term (current) drug therapy: Secondary | ICD-10-CM | POA: Diagnosis not present

## 2017-12-15 DIAGNOSIS — R0600 Dyspnea, unspecified: Secondary | ICD-10-CM

## 2017-12-15 DIAGNOSIS — I10 Essential (primary) hypertension: Secondary | ICD-10-CM

## 2017-12-15 DIAGNOSIS — Z7901 Long term (current) use of anticoagulants: Secondary | ICD-10-CM | POA: Diagnosis not present

## 2017-12-15 DIAGNOSIS — I447 Left bundle-branch block, unspecified: Secondary | ICD-10-CM

## 2017-12-15 NOTE — Telephone Encounter (Signed)
°  Precert needed for: Echo   Location: [facility]    Date: Dec 29, 2017

## 2017-12-15 NOTE — Addendum Note (Signed)
Addended by: Barbarann Ehlers A on: 12/15/2017 02:44 PM   Modules accepted: Orders

## 2017-12-15 NOTE — Patient Instructions (Addendum)
Medication Instructions: STOP Amiodarone    If you need a refill on your cardiac medications before your next appointment, please call your pharmacy.   Lab work: Get lab work 1 week before your Cat Scan  :BMET,TSH If you have labs (blood work) drawn today and your tests are completely normal, you will receive your results only by: Marland Kitchen MyChart Message (if you have MyChart) OR . A paper copy in the mail If you have any lab test that is abnormal or we need to change your treatment, we will call you to review the results.  Testing/Procedures: Your physician has requested that you have an echocardiogram. Echocardiography is a painless test that uses sound waves to create images of your heart. It provides your doctor with information about the size and shape of your heart and how well your heart's chambers and valves are working. This procedure takes approximately one hour. There are no restrictions for this procedure.    Follow-Up: At [facility], you and your health needs are our priority.  As part of our continuing mission to provide you with exceptional heart care, we have created designated Provider Care Teams.  These Care Teams include your primary Cardiologist (physician) and Advanced Practice Providers (APPs -  Physician Assistants and Nurse Practitioners) who all work together to provide you with the care you need, when you need it. You will need a follow up appointment in 3 months.  Please call our office 2 months in advance to schedule this appointment.  You may see Kate Sable, MD or one of the following Advanced Practice Providers on your designated Care Team:   Bernerd Pho, PA-C [facility]) . Ermalinda Barrios, PA-C ([place])  Any Other Special Instructions Will Be Listed Below (If Applicable).    Please arrive at the [facility] main entrance of [facility] at xx:xx AM (30-45 minutes prior to test start time)  [facility] [place], [place] [address] [phone]  Proceed to the [facility] Radiology Department (First Floor).  Please follow these instructions carefully (unless otherwise directed):  Hold all erectile dysfunction medications at least 48 hours prior to test.  On the Night Before the Test: . Be sure to Drink plenty of water. . Do not consume any caffeinated/decaffeinated beverages or chocolate 12 hours prior to your test. . Do not take any antihistamines 12 hours prior to your test. .  .  On the Day of the Test: . Drink plenty of water. Do not drink any water within one hour of the test. . Do not eat any food 4 hours prior to the test. . You may take your regular medications prior to the test.  . Take metoprolol (Lopressor) two hours prior to test. Take 2 of your LOPRESSOR 25 mg pills (total is 50 mg) . HOLD Furosemide/Hydrochlorothiazide morning of the test.       After the Test: . Drink plenty of water. . After receiving IV contrast, you may experience a mild flushed feeling. This is normal. . On occasion, you may experience a mild rash up to 24 hours after the test. This is not dangerous. If this occurs, you can take Benadryl 25 mg and increase your fluid intake. . If you experience trouble breathing, this can be serious. If it is severe call 911 IMMEDIATELY. If it is mild, please call our office. . If you take any of these medications: Glipizide/Metformin, Avandament, Glucavance, please do not take 48 hours after completing  test.

## 2017-12-15 NOTE — Progress Notes (Signed)
SUBJECTIVE: The patient presents for follow-up of chronic diastolic heart failure, coronary artery disease and paroxysmal atrial fibrillation.  Neurology previously stopped Lipitor.  He also has stage I B-cell lymphoma of the left scalp and a h/o prostate cancer s/p radiation therapy.  ECG performed in the office today which I ordered and personally interpreted demonstrates normal sinus rhythm with a left bundle branch block.  For the past 3 or 4 months he has been having exertional dyspnea.  He initially noticed it and walking throughout the grocery store and trying to carry shopping bags but now he become short of breath when walking from one room to the next.  He has bilateral arm pain as well.  He denies chest pain and tightness.  He denies leg swelling.  Lasix was reduced to 20 mg daily he is no longer on supplemental potassium for reasons unclear to me.   Review of Systems: As per "subjective", otherwise negative.  No Known Allergies  Current Outpatient Medications  Medication Sig Dispense Refill  . acetaminophen (TYLENOL) 325 MG tablet Take 325 mg by mouth every 8 (eight) hours as needed for moderate pain.     Marland Kitchen amiodarone (PACERONE) 200 MG tablet Take 1 tablet (200 mg total) by mouth daily. 30 tablet 0  . fluticasone (FLONASE) 50 MCG/ACT nasal spray 1-2 sprays by Each Nare route daily.    . furosemide (LASIX) 20 MG tablet     . loratadine (CLARITIN) 10 MG tablet Take 10 mg by mouth daily.    . metoprolol tartrate (LOPRESSOR) 25 MG tablet Take 25 mg by mouth 2 (two) times daily.    Marland Kitchen omeprazole (PRILOSEC) 20 MG capsule Take 20 mg by mouth daily as needed (acid reflux).    . Pediatric Multivitamins-Iron (FLINTSTONES PLUS IRON) chewable tablet Chew 1 tablet by mouth 2 (two) times daily.    . Polyvinyl Alcohol-Povidone PF 1.4-0.6 % SOLN     . rivaroxaban (XARELTO) 20 MG TABS tablet Take 1 tablet (20 mg total) by mouth daily with supper.    . tamsulosin (FLOMAX) 0.4 MG CAPS  capsule Take 0.4 mg by mouth 2 (two) times daily.      No current facility-administered medications for this visit.     Past Medical History:  Diagnosis Date  . Atrial fibrillation ([place])   . Cancer [facility])    prostate  . Dyspnea   . Hypertension     Past Surgical History:  Procedure Laterality Date  . COLONOSCOPY N/A 08/25/2017   Procedure: COLONOSCOPY;  Surgeon: Rogene Houston, MD;  Location: [facility]O [facility];  Service: Endoscopy;  Laterality: N/A;  . HERNIA REPAIR    . PROSTATE SURGERY      Social History   Socioeconomic History  . Marital status: Married    Spouse name: Not on file  . Number of children: 2  . Years of education: Not on file  . Highest education level: Not on file  Occupational History  . 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 Financial resource strain: Not hard at all  . Food insecurity:    Worry: Never true    Inability: Never true  . Transportation needs:    Medical: No    Non-medical: No  Tobacco Use  . Smoking status: Former Smoker    Packs/day: 0.25    Years: 50.00    Pack years: 12.50    Types: Cigarettes    Last attempt to  quit: 02/15/1990    Years since quitting: 27.8  . Smokeless tobacco: Never Used  Substance and Sexual Activity  . Alcohol use: No  . Drug use: No  . Sexual activity: Not Currently  Lifestyle  . Physical activity:    Days per week: 0 days    Minutes per session: 0 min  . Stress: Not at all  Relationships  . Social connections:    Talks on phone: Once a week    Gets together: More than three times a week    Attends religious service: 1 to 4 times per year    Active member of club or organization: No    Attends meetings of clubs or organizations: Never    Relationship status: Married  . Intimate partner violence:    Fear of current or ex partner: No    Emotionally abused: No    Physically abused: No    Forced sexual activity: No  Other Topics Concern  . Not on file  Social  History Narrative  . Not on file     Vitals:   12/15/17 1345  BP: (!) 152/70  Pulse: 73  SpO2: 94%  Weight: 205 lb (93 kg)  Height: 5\' 9"  (1.753 m)    Wt Readings from Last 3 Encounters:  12/15/17 205 lb (93 kg)  11/17/17 204 lb 1.6 oz (92.6 kg)  09/30/17 207 lb 8 oz (94.1 kg)     PHYSICAL EXAM General: NAD HEENT: Normal. Neck: No JVD, no thyromegaly. Lungs: Clear to auscultation bilaterally with normal respiratory effort. CV: Regular rate and rhythm, normal S1/S2, no S3/S4, no murmur. No pretibial or periankle edema.   Abdomen: Soft, nontender, no distention.  Neurologic: Alert and oriented.  Psych: Normal affect. Skin: Normal. Musculoskeletal: No gross deformities.    ECG: Reviewed above under Subjective   Labs: Lab Results  Component Value Date/Time   K 4.0 09/30/2017 09:24 AM   BUN 18 09/30/2017 09:24 AM   CREATININE 1.12 09/30/2017 09:24 AM   ALT 26 09/30/2017 09:24 AM   TSH 1.935 06/14/2016 05:52 PM   HGB 11.3 (L) 09/30/2017 09:24 AM     Lipids: Lab Results  Component Value Date/Time   LDLCALC 62 06/15/2016 05:01 AM   CHOL 115 06/15/2016 05:01 AM   TRIG 98 06/15/2016 05:01 AM   HDL 33 (L) 06/15/2016 05:01 AM       ASSESSMENT AND PLAN:  1. Chronic diastolic heart failure: He has been experiencing exertional dyspnea for the past 3-4 months. Euvolemic on Lasix 20 mg daily. No longer on supplemental potassium.  I will obtain a follow-up echocardiogram to evaluate for interval changes in cardiac structure and function.  2. CAD: He has been experiencing exertional dyspnea: Evidence for prior MI by stress test. No significant ischemic territories by nuclear stress test in 2018.He is on metoprololbut is not takingLipitoras it was discontinued by neurology. He is not on aspirin as he is taking Xarelto. I will obtain coronary CT angiography for further clarification.  I will also obtain a follow-up echocardiogram to evaluate for interval changes in  cardiac structure and function.  3. Paroxysmal atrial fibrillation: Currently in sinus rhythm.  Given his progressive shortness of breath, I worry about amiodarone toxicity.  I will discontinue this.  Continue Xarelto.Liver transaminases were normal in August 2019. I will check TSH if not checked by PCP. If echocardiogram and coronary CT angiogram are unremarkable, I will pursue pulmonary function testing.  4. Hypertension: Controlled. No changes.  Disposition: Follow up 3 months   Kate Sable, M.D., F.A.C.C.

## 2017-12-29 ENCOUNTER — Other Ambulatory Visit: Payer: Self-pay

## 2017-12-29 ENCOUNTER — Ambulatory Visit (INDEPENDENT_AMBULATORY_CARE_PROVIDER_SITE_OTHER): Payer: Medicare Other

## 2017-12-29 DIAGNOSIS — I447 Left bundle-branch block, unspecified: Secondary | ICD-10-CM

## 2017-12-29 DIAGNOSIS — R0609 Other forms of dyspnea: Secondary | ICD-10-CM | POA: Diagnosis not present

## 2017-12-29 DIAGNOSIS — R0600 Dyspnea, unspecified: Secondary | ICD-10-CM

## 2018-01-02 ENCOUNTER — Telehealth: Payer: Self-pay | Admitting: *Deleted

## 2018-01-02 NOTE — Telephone Encounter (Signed)
-----   Message from Herminio Commons, MD sent at 12/29/2017  5:23 PM EST ----- Normal pumping function.

## 2018-01-03 NOTE — Telephone Encounter (Signed)
Patient informed. 

## 2018-01-16 ENCOUNTER — Other Ambulatory Visit (HOSPITAL_COMMUNITY)
Admission: RE | Admit: 2018-01-16 | Discharge: 2018-01-16 | Disposition: A | Discharge status: Home / Self Care | Payer: Medicare Other | Source: Ambulatory Visit | Attending: Cardiovascular Disease | Admitting: Cardiovascular Disease

## 2018-01-16 DIAGNOSIS — Z79899 Other long term (current) drug therapy: Secondary | ICD-10-CM | POA: Diagnosis present

## 2018-01-16 DIAGNOSIS — I10 Essential (primary) hypertension: Secondary | ICD-10-CM | POA: Diagnosis present

## 2018-01-16 LAB — TSH: TSH: 2.33 u[IU]/mL (ref 0.350–4.500)

## 2018-01-16 LAB — BASIC METABOLIC PANEL
Anion gap: 7 (ref 5–15)
BUN: 15 mg/dL (ref 8–23)
CO2: 29 mmol/L (ref 22–32)
Calcium: 8.6 mg/dL — ABNORMAL LOW (ref 8.9–10.3)
Chloride: 100 mmol/L (ref 98–111)
Creatinine, Ser: 1 mg/dL (ref 0.61–1.24)
GFR calc Af Amer: >60 mL/min (ref >60)
GFR calc non Af Amer: >60 mL/min (ref >60)
Glucose, Bld: 128 mg/dL — ABNORMAL HIGH (ref 70–99)
Potassium: 4.3 mmol/L (ref 3.5–5.1)
Sodium: 136 mmol/L (ref 135–145)

## 2018-01-20 ENCOUNTER — Telehealth: Payer: Self-pay | Admitting: Cardiovascular Disease

## 2018-01-20 NOTE — Telephone Encounter (Signed)
[phone] cell phone

## 2018-01-20 NOTE — Telephone Encounter (Signed)
Patient returning Gayle's call. He is at [facility] and will try to call back before we close today at 33

## 2018-01-20 NOTE — Telephone Encounter (Signed)
Pt aware - routed to pcp  

## 2018-01-20 NOTE — Telephone Encounter (Signed)
-----   Message from Merlene Laughter, RN sent at 01/16/2018  4:41 PM EST -----   ----- Message ----- From: Herminio Commons, MD Sent: 01/16/2018   4:39 PM EST To: Merlene Laughter, RN  Normal thyroid and renal function.

## 2018-01-23 ENCOUNTER — Ambulatory Visit (HOSPITAL_COMMUNITY)
Admission: RE | Admit: 2018-01-23 | Discharge: 2018-01-23 | Disposition: A | Discharge status: Home / Self Care | Payer: Medicare Other | Source: Ambulatory Visit | Attending: Cardiovascular Disease | Admitting: Cardiovascular Disease

## 2018-01-23 DIAGNOSIS — R0600 Dyspnea, unspecified: Secondary | ICD-10-CM

## 2018-01-23 DIAGNOSIS — R0609 Other forms of dyspnea: Principal | ICD-10-CM

## 2018-01-23 DIAGNOSIS — I447 Left bundle-branch block, unspecified: Secondary | ICD-10-CM

## 2018-01-23 MED ORDER — NITROGLYCERIN 0.4 MG SL SUBL
0.8000 mg | SUBLINGUAL_TABLET | Freq: Once | SUBLINGUAL | Status: AC
  Administered 2018-01-23: 0.8 mg via SUBLINGUAL
  Filled 2018-01-23: qty 25

## 2018-01-23 MED ORDER — IOPAMIDOL (ISOVUE-370) INJECTION 76%
100.0000 mL | Freq: Once | INTRAVENOUS | Status: AC | PRN
  Administered 2018-01-23: 100 mL via INTRAVENOUS

## 2018-01-23 MED ORDER — NITROGLYCERIN 0.4 MG SL SUBL
SUBLINGUAL_TABLET | SUBLINGUAL | Status: AC
  Filled 2018-01-23: qty 2

## 2018-01-26 ENCOUNTER — Telehealth: Payer: Self-pay | Admitting: *Deleted

## 2018-01-26 NOTE — Telephone Encounter (Signed)
Notes recorded by Laurine Blazer, LPN on 33/38/3291 at 4:45 PM EST Daughter (Deb) notified. Copy to pmd. OV scheduled for 01/31/2018 with Dr. Bronson Ing in [place] office. ------  Notes recorded by Laurine Blazer, LPN on 91/66/0600 at 4:38 PM EST Left message to return call with patient. Also, left message with daughter Suzi Roots) to call back. ------  Notes recorded by Herminio Commons, MD on 01/25/2018 at 4:12 PM EST Would ideally like him to see [facility] this month if possible. Can he put on wait list? Any availability at other locations? ------  Notes recorded by Laurine Blazer, LPN on 45/99/7741 at 4:02 PM EST Patient is scheduled for 03/07/2018. Is that too far away ?? ------  Notes recorded by Herminio Commons, MD on 01/24/2018 at 10:49 AM EST He appears to have significant blockages and will require coronary angiography. Please make a follow-up appointment in the near future so that further management can be discussed in detail with the patient.

## 2018-01-31 ENCOUNTER — Encounter: Payer: Self-pay | Admitting: Cardiovascular Disease

## 2018-01-31 ENCOUNTER — Ambulatory Visit (INDEPENDENT_AMBULATORY_CARE_PROVIDER_SITE_OTHER): Payer: Medicare Other | Admitting: Cardiovascular Disease

## 2018-01-31 VITALS — BP 147/72 | HR 70 | Ht 69.0 in | Wt 203.2 lb

## 2018-01-31 DIAGNOSIS — I25118 Atherosclerotic heart disease of native coronary artery with other forms of angina pectoris: Secondary | ICD-10-CM

## 2018-01-31 DIAGNOSIS — I10 Essential (primary) hypertension: Secondary | ICD-10-CM

## 2018-01-31 DIAGNOSIS — R0609 Other forms of dyspnea: Secondary | ICD-10-CM | POA: Diagnosis not present

## 2018-01-31 DIAGNOSIS — Z7901 Long term (current) use of anticoagulants: Secondary | ICD-10-CM | POA: Diagnosis not present

## 2018-01-31 DIAGNOSIS — Z955 Presence of coronary angioplasty implant and graft: Secondary | ICD-10-CM | POA: Diagnosis not present

## 2018-01-31 DIAGNOSIS — I5032 Chronic diastolic (congestive) heart failure: Secondary | ICD-10-CM

## 2018-01-31 DIAGNOSIS — I480 Paroxysmal atrial fibrillation: Secondary | ICD-10-CM | POA: Diagnosis not present

## 2018-01-31 DIAGNOSIS — R0600 Dyspnea, unspecified: Secondary | ICD-10-CM

## 2018-01-31 NOTE — Patient Instructions (Signed)
Medication Instructions:  Continue all current medications.  Labwork: none  Testing/Procedures: none  Follow-Up: 3 months   Any Other Special Instructions Will Be Listed Below (If Applicable).  If you need a refill on your cardiac medications before your next appointment, please call your pharmacy.  

## 2018-01-31 NOTE — Progress Notes (Signed)
SUBJECTIVE: The patient presents for follow-up to review cardiac testing for the evaluation of exertional dyspnea.  He has a history of coronary artery disease, chronic diastolic heart failure, and paroxysmal atrial fibrillation.  Coronary artery calcium score was 1169 Agatston units.  There was extensive coronary calcification and possible severe stenosis in the mid RCA and mid LAD as well as the ostium of the ramus.  FFR could not be performed due to misregistration artifact.  Echocardiogram 12/29/2017: Normal left ventricular systolic function, LVEF 55 to 32%, grade 1 diastolic dysfunction.  He is here with his daughter-in-law.  He said he is feeling much better with respect to shortness of breath.  He is now able to walk down to his barn and back without difficulty.  He denies leg fatigue which he was experiencing before.  He also denies chest pain and arm pain.  He feels like his energy levels have improved.      Review of Systems: As per "subjective", otherwise negative.  No Known Allergies  Current Outpatient Medications  Medication Sig Dispense Refill  . acetaminophen (TYLENOL) 325 MG tablet Take 325 mg by mouth every 8 (eight) hours as needed for moderate pain.     . fluticasone (FLONASE) 50 MCG/ACT nasal spray 1-2 sprays by Each Nare route daily.    . furosemide (LASIX) 20 MG tablet Take 20 mg by mouth daily.     Marland Kitchen loratadine (CLARITIN) 10 MG tablet Take 10 mg by mouth daily.    . metoprolol tartrate (LOPRESSOR) 25 MG tablet Take 25 mg by mouth 2 (two) times daily.    Marland Kitchen omeprazole (PRILOSEC) 20 MG capsule Take 20 mg by mouth daily as needed (acid reflux).    . Pediatric Multivitamins-Iron (FLINTSTONES PLUS IRON) chewable tablet Chew 1 tablet by mouth 2 (two) times daily.    . Polyvinyl Alcohol-Povidone PF 1.4-0.6 % SOLN     . rivaroxaban (XARELTO) 20 MG TABS tablet Take 1 tablet (20 mg total) by mouth daily with supper.    . tamsulosin (FLOMAX) 0.4 MG CAPS capsule  Take 0.4 mg by mouth 2 (two) times daily.      No current facility-administered medications for this visit.     Past Medical History:  Diagnosis Date  . Atrial fibrillation ([place])   . Cancer [facility])    prostate  . Dyspnea   . Hypertension     Past Surgical History:  Procedure Laterality Date  . COLONOSCOPY N/A 08/25/2017   Procedure: COLONOSCOPY;  Surgeon: Rogene Houston, MD;  Location: [facility];  Service: Endoscopy;  Laterality: N/A;  . HERNIA REPAIR    . PROSTATE SURGERY      Social History   Socioeconomic History  . Marital status: Married    Spouse name: Not on file  . Number of children: 2  . Years of education: Not on file  . Highest education level: Not on file  Occupational History  . 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 Financial resource strain: Not hard at all  . Food insecurity:    Worry: Never true    Inability: Never true  . Transportation needs:    Medical: No    Non-medical: No  Tobacco Use  . Smoking status: Former Smoker    Packs/day: 0.25    Years: 50.00    Pack years: 12.50    Types: Cigarettes    Last attempt to quit: 02/15/1990  Years since quitting: 27.9  . Smokeless tobacco: Never Used  Substance and Sexual Activity  . Alcohol use: No  . Drug use: No  . Sexual activity: Not Currently  Lifestyle  . Physical activity:    Days per week: 0 days    Minutes per session: 0 min  . Stress: Not at all  Relationships  . Social connections:    Talks on phone: Once a week    Gets together: More than three times a week    Attends religious service: 1 to 4 times per year    Active member of club or organization: No    Attends meetings of clubs or organizations: Never    Relationship status: Married  . Intimate partner violence:    Fear of current or ex partner: No    Emotionally abused: No    Physically abused: No    Forced sexual activity: No  Other Topics Concern  . Not on file  Social History  Narrative  . Not on file     Vitals:   01/31/18 0954  BP: (!) 147/72  Pulse: 70  SpO2: 98%  Weight: 203 lb 3.2 oz (92.2 kg)  Height: 5\' 9"  (1.753 m)    Wt Readings from Last 3 Encounters:  01/31/18 203 lb 3.2 oz (92.2 kg)  12/15/17 205 lb (93 kg)  11/17/17 204 lb 1.6 oz (92.6 kg)     PHYSICAL EXAM General: NAD HEENT: Normal. Neck: No JVD, no thyromegaly. Lungs: Clear to auscultation bilaterally with normal respiratory effort. CV: Regular rate and rhythm, normal S1/S2, no S3/S4, no murmur. No pretibial or periankle edema.  No carotid bruit.   Abdomen: Soft, nontender, no distention.  Neurologic: Alert and oriented.  Psych: Normal affect. Skin: Normal. Musculoskeletal: No gross deformities.    ECG: Reviewed above under Subjective   Labs: Lab Results  Component Value Date/Time   K 4.3 01/16/2018 10:40 AM   BUN 15 01/16/2018 10:40 AM   CREATININE 1.00 01/16/2018 10:40 AM   ALT 26 09/30/2017 09:24 AM   TSH 2.330 01/16/2018 10:42 AM   HGB 11.3 (L) 09/30/2017 09:24 AM     Lipids: Lab Results  Component Value Date/Time   LDLCALC 62 06/15/2016 05:01 AM   CHOL 115 06/15/2016 05:01 AM   TRIG 98 06/15/2016 05:01 AM   HDL 33 (L) 06/15/2016 05:01 AM       ASSESSMENT AND PLAN: 1. Chronic diastolic heart failure: He had been experiencing exertional dyspnea for the past 3-4 months which may be due to obstructive coronary artery disease.  However, he feels a significant improvement after stopping amiodarone. Euvolemic on Lasix 20 mg daily. No longer on supplemental potassium.    Left ventricular systolic function is normal with grade 1 diastolic dysfunction by echocardiogram as detailed above.  2. CAD: He has been experiencing exertional dyspnea withevidence for prior MIby stress test.  However, symptoms have significantly improved with amiodarone discontinuation.  No significant ischemic territories by nuclear stress test in 2018.He is on metoprololbut is not  takingLipitoras it was discontinued by neurology. He is not on aspirin as he is taking Xarelto.  Coronary CT angiography results detailed above with possible severe stenosis in multiple vessels. We discussed the possibility of coronary angiography but he would like to hold off at this time.  I told him to please inform me should his symptoms get worse.  3. Paroxysmal atrial fibrillation: Currently in sinus rhythm.    I previously stopped amiodarone due to concerns  for pulmonary toxicity given progressive exertional dyspnea.  Symptoms have considerably improved. Continue Xarelto. TSH and renal function were normal on 01/16/2018.  4. Hypertension: Mildly elevated. No changes.      Disposition: Follow up 3 months   Kate Sable, M.D., F.A.C.C.

## 2018-02-22 ENCOUNTER — Inpatient Hospital Stay (HOSPITAL_COMMUNITY): Payer: Medicare Other | Attending: Hematology

## 2018-02-22 DIAGNOSIS — Z923 Personal history of irradiation: Secondary | ICD-10-CM | POA: Diagnosis not present

## 2018-02-22 DIAGNOSIS — Z79899 Other long term (current) drug therapy: Secondary | ICD-10-CM | POA: Insufficient documentation

## 2018-02-22 DIAGNOSIS — C8511 Unspecified B-cell lymphoma, lymph nodes of head, face, and neck: Secondary | ICD-10-CM | POA: Diagnosis not present

## 2018-02-22 DIAGNOSIS — Z8546 Personal history of malignant neoplasm of prostate: Secondary | ICD-10-CM | POA: Insufficient documentation

## 2018-02-22 DIAGNOSIS — R0609 Other forms of dyspnea: Secondary | ICD-10-CM | POA: Insufficient documentation

## 2018-02-22 DIAGNOSIS — Z7901 Long term (current) use of anticoagulants: Secondary | ICD-10-CM | POA: Diagnosis not present

## 2018-02-22 DIAGNOSIS — I10 Essential (primary) hypertension: Secondary | ICD-10-CM | POA: Diagnosis not present

## 2018-02-22 DIAGNOSIS — C8590 Non-Hodgkin lymphoma, unspecified, unspecified site: Secondary | ICD-10-CM

## 2018-02-22 DIAGNOSIS — Z87891 Personal history of nicotine dependence: Secondary | ICD-10-CM | POA: Insufficient documentation

## 2018-02-22 DIAGNOSIS — I4891 Unspecified atrial fibrillation: Secondary | ICD-10-CM | POA: Insufficient documentation

## 2018-02-22 LAB — CBC WITH DIFFERENTIAL/PLATELET
Abs Immature Granulocytes: 0.04 10*3/uL (ref 0.00–0.07)
Basophils Absolute: 0 10*3/uL (ref 0.0–0.1)
Basophils Relative: 1 %
Eosinophils Absolute: 0.3 10*3/uL (ref 0.0–0.5)
Eosinophils Relative: 4 %
HCT: 33.7 % — ABNORMAL LOW (ref 39.0–52.0)
Hemoglobin: 9.4 g/dL — ABNORMAL LOW (ref 13.0–17.0)
Immature Granulocytes: 1 %
Lymphocytes Relative: 17 %
Lymphs Abs: 1 10*3/uL (ref 0.7–4.0)
MCH: 22.4 pg — ABNORMAL LOW (ref 26.0–34.0)
MCHC: 27.9 g/dL — ABNORMAL LOW (ref 30.0–36.0)
MCV: 80.2 fL (ref 80.0–100.0)
Monocytes Absolute: 0.9 10*3/uL (ref 0.1–1.0)
Monocytes Relative: 14 %
Neutro Abs: 4 10*3/uL (ref 1.7–7.7)
Neutrophils Relative %: 63 %
Platelets: 252 10*3/uL (ref 150–400)
RBC: 4.2 MIL/uL — ABNORMAL LOW (ref 4.22–5.81)
RDW: 17.4 % — ABNORMAL HIGH (ref 11.5–15.5)
WBC: 6.2 10*3/uL (ref 4.0–10.5)
nRBC: 0 % (ref 0.0–0.2)

## 2018-02-22 LAB — COMPREHENSIVE METABOLIC PANEL
ALT: 18 U/L (ref 0–44)
AST: 22 U/L (ref 15–41)
Albumin: 3.7 g/dL (ref 3.5–5.0)
Alkaline Phosphatase: 56 U/L (ref 38–126)
Anion gap: 9 (ref 5–15)
BUN: 14 mg/dL (ref 8–23)
CO2: 27 mmol/L (ref 22–32)
Calcium: 9.1 mg/dL (ref 8.9–10.3)
Chloride: 100 mmol/L (ref 98–111)
Creatinine, Ser: 1.09 mg/dL (ref 0.61–1.24)
GFR calc Af Amer: >60 mL/min (ref >60)
GFR calc non Af Amer: >60 mL/min (ref >60)
Glucose, Bld: 110 mg/dL — ABNORMAL HIGH (ref 70–99)
Potassium: 4.3 mmol/L (ref 3.5–5.1)
Sodium: 136 mmol/L (ref 135–145)
Total Bilirubin: 0.5 mg/dL (ref 0.3–1.2)
Total Protein: 7.6 g/dL (ref 6.5–8.1)

## 2018-02-22 LAB — LACTATE DEHYDROGENASE: LDH: 122 U/L (ref 98–192)

## 2018-03-01 ENCOUNTER — Inpatient Hospital Stay (HOSPITAL_COMMUNITY): Payer: Medicare Other

## 2018-03-01 ENCOUNTER — Encounter (HOSPITAL_COMMUNITY): Payer: Self-pay | Admitting: Hematology

## 2018-03-01 ENCOUNTER — Other Ambulatory Visit: Payer: Self-pay

## 2018-03-01 ENCOUNTER — Inpatient Hospital Stay (HOSPITAL_BASED_OUTPATIENT_CLINIC_OR_DEPARTMENT_OTHER): Payer: Medicare Other | Admitting: Hematology

## 2018-03-01 VITALS — BP 130/52 | HR 73 | Temp 97.7°F | Wt 203.4 lb

## 2018-03-01 DIAGNOSIS — C8590 Non-Hodgkin lymphoma, unspecified, unspecified site: Secondary | ICD-10-CM

## 2018-03-01 DIAGNOSIS — Z923 Personal history of irradiation: Secondary | ICD-10-CM

## 2018-03-01 DIAGNOSIS — C8511 Unspecified B-cell lymphoma, lymph nodes of head, face, and neck: Secondary | ICD-10-CM

## 2018-03-01 DIAGNOSIS — Z7901 Long term (current) use of anticoagulants: Secondary | ICD-10-CM

## 2018-03-01 DIAGNOSIS — R0609 Other forms of dyspnea: Secondary | ICD-10-CM | POA: Diagnosis not present

## 2018-03-01 DIAGNOSIS — I4891 Unspecified atrial fibrillation: Secondary | ICD-10-CM

## 2018-03-01 DIAGNOSIS — Z79899 Other long term (current) drug therapy: Secondary | ICD-10-CM

## 2018-03-01 DIAGNOSIS — R0600 Dyspnea, unspecified: Secondary | ICD-10-CM | POA: Diagnosis not present

## 2018-03-01 DIAGNOSIS — Z8546 Personal history of malignant neoplasm of prostate: Secondary | ICD-10-CM

## 2018-03-01 DIAGNOSIS — I10 Essential (primary) hypertension: Secondary | ICD-10-CM

## 2018-03-01 DIAGNOSIS — Z87891 Personal history of nicotine dependence: Secondary | ICD-10-CM

## 2018-03-01 LAB — CBC WITH DIFFERENTIAL/PLATELET
Abs Immature Granulocytes: 0.02 10*3/uL (ref 0.00–0.07)
Basophils Absolute: 0 10*3/uL (ref 0.0–0.1)
Basophils Relative: 1 %
Eosinophils Absolute: 0.2 10*3/uL (ref 0.0–0.5)
Eosinophils Relative: 4 %
HCT: 32 % — ABNORMAL LOW (ref 39.0–52.0)
Hemoglobin: 9.3 g/dL — ABNORMAL LOW (ref 13.0–17.0)
Immature Granulocytes: 0 %
Lymphocytes Relative: 19 %
Lymphs Abs: 1.1 10*3/uL (ref 0.7–4.0)
MCH: 22.9 pg — ABNORMAL LOW (ref 26.0–34.0)
MCHC: 29.1 g/dL — ABNORMAL LOW (ref 30.0–36.0)
MCV: 78.6 fL — ABNORMAL LOW (ref 80.0–100.0)
Monocytes Absolute: 0.8 10*3/uL (ref 0.1–1.0)
Monocytes Relative: 14 %
Neutro Abs: 3.5 10*3/uL (ref 1.7–7.7)
Neutrophils Relative %: 62 %
Platelets: 229 10*3/uL (ref 150–400)
RBC: 4.07 MIL/uL — ABNORMAL LOW (ref 4.22–5.81)
RDW: 17.5 % — ABNORMAL HIGH (ref 11.5–15.5)
WBC: 5.7 10*3/uL (ref 4.0–10.5)
nRBC: 0 % (ref 0.0–0.2)

## 2018-03-01 LAB — COMPREHENSIVE METABOLIC PANEL
ALT: 16 U/L (ref 0–44)
AST: 20 U/L (ref 15–41)
Albumin: 3.9 g/dL (ref 3.5–5.0)
Alkaline Phosphatase: 57 U/L (ref 38–126)
Anion gap: 10 (ref 5–15)
BUN: 19 mg/dL (ref 8–23)
CO2: 26 mmol/L (ref 22–32)
Calcium: 8.9 mg/dL (ref 8.9–10.3)
Chloride: 99 mmol/L (ref 98–111)
Creatinine, Ser: 1.02 mg/dL (ref 0.61–1.24)
GFR calc Af Amer: >60 mL/min (ref >60)
GFR calc non Af Amer: >60 mL/min (ref >60)
Glucose, Bld: 121 mg/dL — ABNORMAL HIGH (ref 70–99)
Potassium: 4.2 mmol/L (ref 3.5–5.1)
Sodium: 135 mmol/L (ref 135–145)
Total Bilirubin: 0.6 mg/dL (ref 0.3–1.2)
Total Protein: 7.7 g/dL (ref 6.5–8.1)

## 2018-03-01 LAB — IRON AND TIBC
Iron: 26 ug/dL — ABNORMAL LOW (ref 45–182)
Saturation Ratios: 5 % — ABNORMAL LOW (ref 17.9–39.5)
TIBC: 547 ug/dL — ABNORMAL HIGH (ref 250–450)
UIBC: 521 ug/dL

## 2018-03-01 LAB — FERRITIN: Ferritin: 12 ng/mL — ABNORMAL LOW (ref 24–336)

## 2018-03-01 LAB — FOLATE: Folate: 30 ng/mL (ref >5.9)

## 2018-03-01 LAB — VITAMIN B12: Vitamin B-12: 503 pg/mL (ref 180–914)

## 2018-03-01 NOTE — Progress Notes (Signed)
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 male returns for routine follow-up for B-cell lymphoma of the scalp. He has completed radiation and did well with treatment. He does report SOB with exertion this has not changed it is stable. Denies any nausea, vomiting, or diarrhea. Denies any new pains. Had not noticed any recent bleeding such as epistaxis, hematuria or hematochezia. Denies recent chest pain on exertion, shortness of breath on minimal exertion, pre-syncopal episodes, or palpitations. Denies any numbness or tingling in hands or feet. Denies any recent fevers, infections, or recent hospitalizations. Patient reports appetite at 100% and energy level at 100%.   REVIEW OF SYSTEMS:  Review of Systems  Respiratory: Positive for shortness of breath.   All other systems reviewed and are negative.    PAST MEDICAL/SURGICAL HISTORY:  Past Medical History:  Diagnosis Date  . Atrial fibrillation ([place])   . Cancer [facility])    prostate  . Dyspnea   . Hypertension    Past Surgical History:  Procedure Laterality Date  . COLONOSCOPY N/A 08/25/2017   Procedure: COLONOSCOPY;  Surgeon: Rogene Houston, MD;  Location: [facility];  Service: Endoscopy;  Laterality: N/A;  . HERNIA REPAIR    . PROSTATE SURGERY       SOCIAL HISTORY:  Social History   Socioeconomic History  . Marital status: Married    Spouse name: Not on file  . Number of children: 2  . Years of education: Not on file  . Highest education level: Not on file  Occupational History  . 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 Financial resource strain: Not hard at all  . Food insecurity:   Worry: Never true    Inability: Never true  . Transportation needs:    Medical: No    Non-medical: No  Tobacco Use  . Smoking status: Former Smoker    Packs/day: 0.25    Years: 50.00    Pack years: 12.50    Types: Cigarettes    Last attempt to quit: 02/15/1990    Years since quitting: 28.0  . Smokeless tobacco: Never Used  Substance and Sexual Activity  . Alcohol use: No  . Drug use: No  . Sexual activity: Not Currently  Lifestyle  . Physical activity:    Days per week: 0 days    Minutes per session: 0 min  . Stress: Not at all  Relationships  . Social connections:    Talks on phone: Once a week    Gets together: More than three times a week    Attends religious service: 1 to 4 times per year    Active member of club or organization: No    Attends meetings of clubs or organizations: Never    Relationship status: Married  . Intimate partner violence:    Fear of current or ex partner: No    Emotionally abused: No    Physically abused: No    Forced sexual activity: No  Other Topics Concern  . Not on file  Social History Narrative  . Not on file    FAMILY HISTORY:  Family History  Problem Relation Age of  Onset  . CAD Mother 80  . CAD Father 76  . CAD Sister   . CAD Brother     CURRENT MEDICATIONS:  Outpatient Encounter Medications as of 03/01/2018  Medication Sig  . acetaminophen (TYLENOL) 325 MG tablet Take 325 mg by mouth every 8 (eight) hours as needed for moderate pain.   . fluticasone (FLONASE) 50 MCG/ACT nasal spray 1-2 sprays by Each Nare route daily.  . furosemide (LASIX) 20 MG tablet Take 20 mg by mouth daily.   Marland Kitchen loratadine (CLARITIN) 10 MG tablet Take 10 mg by mouth daily.  . metoprolol tartrate (LOPRESSOR) 25 MG tablet Take 25 mg by mouth 2 (two) times daily.  Marland Kitchen omeprazole (PRILOSEC) 20 MG capsule Take 20 mg by mouth daily as needed (acid reflux).  . Pediatric Multivitamins-Iron (FLINTSTONES PLUS IRON) chewable tablet Chew 1 tablet by mouth 2 (two)  times daily.  . Polyvinyl Alcohol-Povidone PF 1.4-0.6 % SOLN   . rivaroxaban (XARELTO) 20 MG TABS tablet Take 1 tablet (20 mg total) by mouth daily with supper.  . tamsulosin (FLOMAX) 0.4 MG CAPS capsule Take 0.4 mg by mouth 2 (two) times daily.    No facility-administered encounter medications on file as of 03/01/2018.     ALLERGIES:  No Known Allergies   PHYSICAL EXAM:  ECOG Performance status: 1  Vitals:   03/01/18 1457  BP: (!) 130/52  Pulse: 73  Temp: 97.7 F (36.5 C)   Filed Weights   03/01/18 1457  Weight: 203 lb 6.4 oz (92.3 kg)    Physical Exam Constitutional:      Appearance: Normal appearance. He is normal weight.  Cardiovascular:     Rate and Rhythm: Normal rate and regular rhythm.     Heart sounds: Normal heart sounds.  Pulmonary:     Effort: Pulmonary effort is normal.     Breath sounds: Normal breath sounds.  Musculoskeletal: Normal range of motion.  Skin:    General: Skin is warm and dry.  Neurological:     Mental Status: He is alert and oriented to person, place, and time. Mental status is at baseline.  Psychiatric:        Mood and Affect: Mood normal.        Behavior: Behavior normal.        Thought Content: Thought content normal.        Judgment: Judgment normal.   No palpable adenopathy or splenomegaly. -Left scalp site is within normal limits.   LABORATORY DATA:  I have reviewed the labs as listed.  CBC    Component Value Date/Time   WBC 5.7 03/01/2018 1549   RBC 4.07 (L) 03/01/2018 1549   HGB 9.3 (L) 03/01/2018 1549   HCT 32.0 (L) 03/01/2018 1549   PLT 229 03/01/2018 1549   MCV 78.6 (L) 03/01/2018 1549   MCH 22.9 (L) 03/01/2018 1549   MCHC 29.1 (L) 03/01/2018 1549   RDW 17.5 (H) 03/01/2018 1549   LYMPHSABS 1.1 03/01/2018 1549   MONOABS 0.8 03/01/2018 1549   EOSABS 0.2 03/01/2018 1549   BASOSABS 0.0 03/01/2018 1549   CMP Latest Ref Rng & Units 02/22/2018 01/16/2018 09/30/2017  Glucose 70 - 99 mg/dL 110(H) 128(H) 127(H)  BUN 8 -  23 mg/dL 14 15 18   Creatinine 0.61 - 1.24 mg/dL 1.09 1.00 1.12  Sodium 135 - 145 mmol/L 136 136 136  Potassium 3.5 - 5.1 mmol/L 4.3 4.3 4.0  Chloride 98 - 111 mmol/L 100 100 98  CO2 22 - 32  mmol/L 27 29 30   Calcium 8.9 - 10.3 mg/dL 9.1 8.6(L) 8.9  Total Protein 6.5 - 8.1 g/dL 7.6 - 7.5  Total Bilirubin 0.3 - 1.2 mg/dL 0.5 - 0.7  Alkaline Phos 38 - 126 U/L 56 - 57  AST 15 - 41 U/L 22 - 27  ALT 0 - 44 U/L 18 - 26       DIAGNOSTIC IMAGING:  I have independently reviewed the scans and discussed with the patient.   I have reviewed Francene Finders, NP's note and agree with the documentation.  I personally performed a face-to-face visit, made revisions and my assessment and plan is as follows.    ASSESSMENT & PLAN:   Non Hodgkin's lymphoma ([place]) 1.  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 -Seen by Dr. Nevada Crane who did biopsy of the erythematous area on 09/22/2017 anteriorly and posteriorly which came back as atypical B-cell infiltrate concerning for B-cell lymphoma, possibly follicular type.  CD20 was strongly positive. -Patient does not have any B symptoms including fevers, night sweats or weight loss. -Physical examination did not reveal any palpable adenopathy or hepatosplenomegaly. - PET/CT scan on 10/31/2017 showed left frontal scalp area hypermetabolism with SUV of 3.3.  High density of liver was seen.  No other hypermetabolic activity suggesting lymphoma.  LDH was normal.  - He underwent radiation therapy to the left scalp lesion from 12/08/2017 through 12/23/2017. -Today's physical examination did not reveal any palpable adenopathy or splenomegaly. - We have reviewed his blood work.  It shows a drop in hemoglobin to 9.3.  MCV also decreased to 80.  I have recommended checking a stool for occult blood.  We will also check iron panel, ferritin, G40, folic acid, TSH, copper levels  today. -If his iron is low, I have recommended parenteral iron therapy.  He has been taking Flintstone iron for the past 1 year.  We talked about Feraheme infusions weekly x2.  We talked about side effects in detail. -I will see him back in 3 months to repeat blood counts.  2.  Prostate cancer: - Diagnosed 3 to 4 years ago, status post radiation therapy at [facility].      Orders placed this encounter:  Orders Placed This Encounter  Procedures  . CBC with Differential/Platelet  . Comprehensive metabolic panel  . Ferritin  . Iron and TIBC  . Vitamin B12  . Folate  . Occult blood x 1 card to lab, stool  . Occult blood x 1 card to lab, stool  . Occult blood x 1 card to lab, stool  . CBC with Differential/Platelet  . Comprehensive metabolic panel  . CBC with Differential/Platelet  . Comprehensive metabolic panel  . Ferritin  . Iron and TIBC  . Vitamin B12  . Folate      Derek Jack, MD [place] [phone]

## 2018-03-01 NOTE — Patient Instructions (Signed)
[facility] at [facility] Discharge Instructions     Thank you for choosing [facility] at [facility] to provide your oncology and hematology care.  To afford each patient quality time with our provider, please arrive at least 15 minutes before your scheduled appointment time.   If you have a lab appointment with the Cancer Center please come in thru the  Main Entrance and check in at the main information desk  You need to re-schedule your appointment should you arrive 10 or more minutes late.  We strive to give you quality time with our providers, and arriving late affects you and other patients whose appointments are after yours.  Also, if you no show three or more times for appointments you may be dismissed from the clinic at the providers discretion.     Again, thank you for choosing [facility].  Our hope is that these requests will decrease the amount of time that you wait before being seen by our physicians.       _____________________________________________________________  Should you have questions after your visit to [facility], please contact our office at [phone] between the hours of 8:00 a.m. and 4:30 p.m.  Voicemails left after 4:00 p.m. will not be returned until the following business day.  For prescription refill requests, have your pharmacy contact our office and allow 72 hours.    Cancer Center Support Programs:   > Cancer Support Group  2nd Tuesday of the month 1pm-2pm, Journey Room    

## 2018-03-01 NOTE — Assessment & Plan Note (Signed)
1.  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 -Seen by Dr. [place] Crane who did biopsy of the erythematous area on 09/22/2017 anteriorly and posteriorly which came back as atypical B-cell infiltrate concerning for B-cell lymphoma, possibly follicular type.  CD20 was strongly positive. -Patient does not have any B symptoms including fevers, night sweats or weight loss. -Physical examination did not reveal any palpable adenopathy or hepatosplenomegaly. - PET/CT scan on 10/31/2017 showed left frontal scalp area hypermetabolism with SUV of 3.3.  High density of liver was seen.  No other hypermetabolic activity suggesting lymphoma.  LDH was normal.  - He underwent radiation therapy to the left scalp lesion from 12/08/2017 through 12/23/2017. -Today's physical examination did not reveal any palpable adenopathy or splenomegaly. - We have reviewed his blood work.  It shows a drop in hemoglobin to 9.3.  MCV also decreased to 80.  I have recommended checking a stool for occult blood.  We will also check iron panel, ferritin, W10, folic acid, TSH, copper levels today. -If his iron is low, I have recommended parenteral iron therapy.  He has been taking Flintstone iron for the past 1 year.  We talked about Feraheme infusions weekly x2.  We talked about side effects in detail. -I will see him back in 3 months to repeat blood counts.  2.  Prostate cancer: - Diagnosed 3 to 4 years ago, status post radiation therapy at [facility].

## 2018-03-06 ENCOUNTER — Other Ambulatory Visit (HOSPITAL_COMMUNITY): Payer: Self-pay | Admitting: Emergency Medicine

## 2018-03-06 DIAGNOSIS — C8511 Unspecified B-cell lymphoma, lymph nodes of head, face, and neck: Secondary | ICD-10-CM | POA: Diagnosis not present

## 2018-03-06 DIAGNOSIS — C8590 Non-Hodgkin lymphoma, unspecified, unspecified site: Secondary | ICD-10-CM

## 2018-03-06 LAB — OCCULT BLOOD X 1 CARD TO LAB, STOOL
Fecal Occult Bld: NEGATIVE
Fecal Occult Bld: NEGATIVE
Fecal Occult Bld: POSITIVE — AB

## 2018-03-07 ENCOUNTER — Ambulatory Visit: Payer: Medicare Other | Admitting: Cardiovascular Disease

## 2018-03-14 ENCOUNTER — Other Ambulatory Visit (HOSPITAL_COMMUNITY): Payer: Self-pay | Admitting: Nurse Practitioner

## 2018-03-16 ENCOUNTER — Ambulatory Visit: Payer: Medicare Other | Admitting: Cardiovascular Disease

## 2018-03-17 ENCOUNTER — Encounter (HOSPITAL_COMMUNITY): Payer: Self-pay

## 2018-03-17 ENCOUNTER — Inpatient Hospital Stay (HOSPITAL_COMMUNITY): Payer: Medicare Other

## 2018-03-17 VITALS — BP 132/43 | HR 63 | Temp 97.5°F | Resp 18

## 2018-03-17 DIAGNOSIS — R195 Other fecal abnormalities: Secondary | ICD-10-CM

## 2018-03-17 DIAGNOSIS — C8511 Unspecified B-cell lymphoma, lymph nodes of head, face, and neck: Secondary | ICD-10-CM | POA: Diagnosis not present

## 2018-03-17 MED ORDER — SODIUM CHLORIDE 0.9 % IV SOLN
Freq: Once | INTRAVENOUS | Status: AC
  Administered 2018-03-17: 11:00:00 via INTRAVENOUS

## 2018-03-17 MED ORDER — SODIUM CHLORIDE 0.9 % IV SOLN
510.0000 mg | Freq: Once | INTRAVENOUS | Status: AC
  Administered 2018-03-17: 510 mg via INTRAVENOUS
  Filled 2018-03-17: qty 510

## 2018-03-17 NOTE — Progress Notes (Signed)
Feraheme given per orders. Patient tolerated it well without problems. Vitals stable and discharged home from clinic ambulatory. Follow up as scheduled.  

## 2018-03-24 ENCOUNTER — Inpatient Hospital Stay (HOSPITAL_COMMUNITY): Payer: Medicare Other | Attending: Hematology

## 2018-03-24 ENCOUNTER — Other Ambulatory Visit: Payer: Self-pay

## 2018-03-24 ENCOUNTER — Encounter (HOSPITAL_COMMUNITY): Payer: Self-pay

## 2018-03-24 VITALS — BP 118/48 | HR 59 | Temp 98.0°F | Resp 18 | Wt 199.8 lb

## 2018-03-24 DIAGNOSIS — Z923 Personal history of irradiation: Secondary | ICD-10-CM | POA: Diagnosis not present

## 2018-03-24 DIAGNOSIS — E611 Iron deficiency: Secondary | ICD-10-CM | POA: Insufficient documentation

## 2018-03-24 DIAGNOSIS — R195 Other fecal abnormalities: Secondary | ICD-10-CM

## 2018-03-24 DIAGNOSIS — C8511 Unspecified B-cell lymphoma, lymph nodes of head, face, and neck: Secondary | ICD-10-CM | POA: Insufficient documentation

## 2018-03-24 MED ORDER — SODIUM CHLORIDE 0.9 % IV SOLN
510.0000 mg | Freq: Once | INTRAVENOUS | Status: AC
  Administered 2018-03-24: 510 mg via INTRAVENOUS
  Filled 2018-03-24: qty 510

## 2018-03-24 MED ORDER — SODIUM CHLORIDE 0.9 % IV SOLN
Freq: Once | INTRAVENOUS | Status: AC
  Administered 2018-03-24: 09:00:00 via INTRAVENOUS

## 2018-03-24 NOTE — Patient Instructions (Signed)
[facility] at [facility]  Discharge Instructions:   _______________________________________________________________  Thank you for choosing [facility] at [facility] to provide your oncology and hematology care.  To afford each patient quality time with our providers, please arrive at least 15 minutes before your scheduled appointment.  You need to re-schedule your appointment if you arrive 10 or more minutes late.  We strive to give you quality time with our providers, and arriving late affects you and other patients whose appointments are after yours.  Also, if you no show three or more times for appointments you may be dismissed from the clinic.  Again, thank you for choosing [facility] at [facility]. Our hope is that these requests will allow you access to exceptional care and in a timely manner. _______________________________________________________________  If you have questions after your visit, please contact our office at [phone] between the hours of 8:30 a.m. and 5:00 p.m. Voicemails left after 4:30 p.m. will not be returned until the following business day. _______________________________________________________________  For prescription refill requests, have your pharmacy contact our office. _______________________________________________________________  Recommendations made by the consultant and any test results will be sent to your referring physician. _______________________________________________________________ 

## 2018-03-24 NOTE — Progress Notes (Signed)
Pt presents today for Feraheme. VSS. MAR reviewed. Pt has no complaints or changes since the last visit.   Treatment given today per MD orders. Tolerated infusion without adverse affects. Vital signs stable. No complaints at this time. Discharged from clinic ambulatory. F/U with [facility] as scheduled.

## 2018-05-04 ENCOUNTER — Telehealth: Payer: Self-pay | Admitting: Cardiovascular Disease

## 2018-05-04 NOTE — Telephone Encounter (Signed)
I called and spoke with the patient.  He is doing well from a cardiac perspective.  He is very agreeable to having his appointment rescheduled given the [place] pandemic, in order to avoid unnecessary exposure.

## 2018-05-05 NOTE — Telephone Encounter (Signed)
Appointment has been rescheduled to 05/29/2018.

## 2018-05-09 ENCOUNTER — Ambulatory Visit: Payer: Medicare Other | Admitting: Cardiovascular Disease

## 2018-05-25 ENCOUNTER — Telehealth: Payer: Self-pay | Admitting: *Deleted

## 2018-05-26 ENCOUNTER — Telehealth: Payer: Self-pay | Admitting: Cardiology

## 2018-05-26 NOTE — Telephone Encounter (Signed)
Virtual Visit Pre-Appointment Phone Call  Steps For Call:  1. Confirm consent - "In the setting of the current Covid19 crisis, you are scheduled for a (phone or video) visit with your provider on (date) at (time).  Just as we do with many in-office visits, in order for you to participate in this visit, we must obtain consent.  If you'd like, I can send this to your mychart (if signed up) or email for you to review.  Otherwise, I can obtain your verbal consent now.  All virtual visits are billed to your insurance company just like a normal visit would be.  By agreeing to a virtual visit, we'd like you to understand that the technology does not allow for your provider to perform an examination, and thus may limit your provider's ability to fully assess your condition.  Finally, though the technology is pretty good, we cannot assure that it will always work on either your or our end, and in the setting of a video visit, we may have to convert it to a phone-only visit.  In either situation, we cannot ensure that we have a secure connection.  Are you willing to proceed?"  2. Give patient instructions for WebEx download to smartphone as below if video visit  3. Advise patient to be prepared with any vital sign or heart rhythm information, their current medicines, and a piece of paper and pen handy for any instructions they may receive the day of their visit  4. Inform patient they will receive a phone call 15 minutes prior to their appointment time (may be from unknown caller ID) so they should be prepared to answer  5. Confirm that appointment type is correct in Epic appointment notes (video vs telephone)    TELEPHONE CALL NOTE  Jesse Murphy has been deemed a candidate for a follow-up tele-health visit to limit community exposure during the Covid-19 pandemic. I spoke with the patient via phone to ensure availability of phone/video source, confirm preferred email & phone number, and discuss  instructions and expectations.  I reminded Jesse Murphy to be prepared with any vital sign and/or heart rhythm information that could potentially be obtained via home monitoring, at the time of his visit. I reminded Jesse Murphy to expect a phone call at the time of his visit if his visit.  Did the patient verbally acknowledge consent to treatment? yes  Chanda Busing 05/26/2018 12:53 PM   DOWNLOADING THE [place], go to [employer] and type in WebEx in the search bar. [place] [employer], the blue/green circle. The app is free but as with any other app downloads, their phone may require them to verify saved payment information or Apple password. The patient does NOT have to create an account.  - If Android, ask patient to go to [employer] and type in WebEx in the search bar. [place] [employer], the blue/green circle. The app is free but as with any other app downloads, their phone may require them to verify saved payment information or Android password. The patient does NOT have to create an account.   CONSENT FOR TELE-HEALTH VISIT - PLEASE REVIEW  I hereby voluntarily request, consent and authorize [facility] and its employed or contracted physicians, physician assistants, nurse practitioners or other licensed health care professionals (the Practitioner), to provide me with telemedicine health care services (the Services") as deemed necessary by the treating Practitioner. I  acknowledge and consent to receive the Services by the Practitioner via telemedicine. I understand that the telemedicine visit will involve communicating with the Practitioner through live audiovisual communication technology and the disclosure of certain medical information by electronic transmission. I acknowledge that I have been given the opportunity to request an in-person assessment or other available alternative prior to the telemedicine visit and am  voluntarily participating in the telemedicine visit.  I understand that I have the right to withhold or withdraw my consent to the use of telemedicine in the course of my care at any time, without affecting my right to future care or treatment, and that the Practitioner or I may terminate the telemedicine visit at any time. I understand that I have the right to inspect all information obtained and/or recorded in the course of the telemedicine visit and may receive copies of available information for a reasonable fee.  I understand that some of the potential risks of receiving the Services via telemedicine include:   Delay or interruption in medical evaluation due to technological equipment failure or disruption;  Information transmitted may not be sufficient (e.g. poor resolution of images) to allow for appropriate medical decision making by the Practitioner; and/or   In rare instances, security protocols could fail, causing a breach of personal health information.  Furthermore, I acknowledge that it is my responsibility to provide information about my medical history, conditions and care that is complete and accurate to the best of my ability. I acknowledge that Practitioner's advice, recommendations, and/or decision may be based on factors not within their control, such as incomplete or inaccurate data provided by me or distortions of diagnostic images or specimens that may result from electronic transmissions. I understand that the practice of medicine is not an exact science and that Practitioner makes no warranties or guarantees regarding treatment outcomes. I acknowledge that I will receive a copy of this consent concurrently upon execution via email to the email address I last provided but may also request a printed copy by calling the office of [facility].    I understand that my insurance will be billed for this visit.   I have read or had this consent read to me.  I understand the  contents of this consent, which adequately explains the benefits and risks of the Services being provided via telemedicine.   I have been provided ample opportunity to ask questions regarding this consent and the Services and have had my questions answered to my satisfaction.  I give my informed consent for the services to be provided through the use of telemedicine in my medical care  By participating in this telemedicine visit I agree to the above.

## 2018-05-26 NOTE — Telephone Encounter (Signed)
Medications and allergies reviewed with patient  Aware to check BP, HR and weight prior to visit today No new lab work or hospitalizations since last visit Consent already obtained

## 2018-05-29 ENCOUNTER — Encounter: Payer: Self-pay | Admitting: Cardiology

## 2018-05-29 ENCOUNTER — Telehealth (INDEPENDENT_AMBULATORY_CARE_PROVIDER_SITE_OTHER): Payer: Medicare Other | Admitting: Cardiology

## 2018-05-29 VITALS — BP 137/66 | HR 71 | Temp 98.0°F | Ht 69.0 in | Wt 195.0 lb

## 2018-05-29 DIAGNOSIS — I5032 Chronic diastolic (congestive) heart failure: Secondary | ICD-10-CM

## 2018-05-29 DIAGNOSIS — I480 Paroxysmal atrial fibrillation: Secondary | ICD-10-CM

## 2018-05-29 DIAGNOSIS — Z9861 Coronary angioplasty status: Secondary | ICD-10-CM

## 2018-05-29 DIAGNOSIS — I10 Essential (primary) hypertension: Secondary | ICD-10-CM | POA: Insufficient documentation

## 2018-05-29 DIAGNOSIS — I2510 Atherosclerotic heart disease of native coronary artery without angina pectoris: Secondary | ICD-10-CM

## 2018-05-29 DIAGNOSIS — Z7189 Other specified counseling: Secondary | ICD-10-CM

## 2018-05-29 NOTE — Progress Notes (Addendum)
Virtual Visit via Telephone Note   This visit type was conducted due to national recommendations for restrictions regarding the COVID-19 Pandemic (e.g. social distancing) in an effort to limit this patient's exposure and mitigate transmission in our community.  Due to his co-morbid illnesses, this patient is at least at moderate risk for complications without adequate follow up.  This format is felt to be most appropriate for this patient at this time.  The patient did not have access to video technology/had technical difficulties with video requiring transitioning to audio format only (telephone).  All issues noted in this document were discussed and addressed.  No physical exam could be performed with this format.  Please refer to the patient's chart for his  consent to telehealth for [facility].   Evaluation Performed:  Follow-up visit  Date:  05/29/2018   ID:  Jesse Murphy, DOB March 17, 1928, MRN [id number]  Patient Location: Home  Provider Location: Home  PCP:  Sandi Mealy, MD  Cardiologist:  Kate Sable, MD  Electrophysiologist:  None   Chief Complaint:  Follow up dCHF, CAD, PAF, HTN  History of Present Illness:    Jesse Murphy is a 85 y.o. male who presents via audio/video conferencing for a telehealth visit today.    Pt was contacted by phone as he does not have a cell phone for video or a computer.   The patient does not have symptoms concerning for COVID-19 infection (fever, chills, cough, or new shortness of breath).   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   Jesse Murphy was having exertional dyspnea with evidence for prior MI by stress test. He underwent Coronary CT on 01/23/2018 showed elevated calcium score of 1169 Agatson units and possible mid LAD stenosis, possible severe ostial stenosis of  Circ, extensive plaque of RCA. FFR was unable to be completed.  Echo 12/29/2017: EF 55-60%, grade 1 DD, no significant valvular abnormalities, no diagnostic regional wall motion abnormality was identified, this possibility cannot be completely excluded on the basis of this study.  Pt was last seen by Dr. Bronson Ing on 01/31/2018 at which time his symptoms had significantly improved with discontinuation of amiodarone. Possibility of coronary angiography was discussed to further evaluate coronary arteries. Pt did not wish to proceed with this at that time. Pt was advised to inform Dr. Bronson Ing if symptoms worsened.   Today pt is being seen for 3 month follow up. He reports that his breathing is "good". He denies any DOE unless he is walking fast. Due to COVID he is not currently leaving the house but his wife recently had a stroke and pt says he is doing home PT exercise with her.  He denies any chest pain/pressure. No orthopnea, PND or edema. No palpitations, lightheadedness or syncope. He says he is feeling better than he has in a while.   He and his wife are trying to eat heart healthy diet.   Home BP's running 116-150 mostly in the 120's-130's. HR in the 60's.   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 Francesca Jewett of [facility], radiation oncology at [facility]. He was treated with a course of definitive radiation to the scalp to a dose of 24 Gy in 12 daily fractions ending on 12/23/17. Pt says that he has done well since  his treatment.   Past Medical History:  Diagnosis Date   Atrial fibrillation ([place])    Cancer [facility])    prostate   Dyspnea    Hypertension    Past Surgical History:  Procedure Laterality Date   COLONOSCOPY N/A 08/25/2017   Procedure: COLONOSCOPY;  Surgeon: Rogene Houston, MD;  Location: [facility];  Service: Endoscopy;  Laterality: N/A;   HERNIA REPAIR     PROSTATE SURGERY       No outpatient medications have  been marked as taking for the 05/29/18 encounter (Telemedicine) with Daune Perch, NP.     Allergies:   Patient has no known allergies.   Social History   Tobacco Use   Smoking status: Former Smoker    Packs/day: 0.25    Years: 50.00    Pack years: 12.50    Types: Cigarettes    Last attempt to quit: 02/15/1990    Years since quitting: 28.3   Smokeless tobacco: Never Used  Substance Use Topics   Alcohol use: No   Drug use: No     Family Hx: The patient's family history includes CAD in his brother and sister; CAD (age of onset: 40) in his mother; CAD (age of onset: 56) in his father.  ROS:   Please see the history of present illness.     All other systems reviewed and are negative.   Prior CV studies:   The following studies were reviewed today:  Coronary CT 02/07/2018 IMPRESSION: 1. Coronary artery calcium score 1169 Agatston units. This places the patient in the 67th percentile for his age group and gender, suggesting intermediate risk for future cardiac events.  2. Extensive coronary calcification (common in patients > 80) make interpretation of this study difficult due to blooming artifact.  3. Possible severe stenosis in the mid RCA and mid LAD, as well as in the ostial ramus (small vessel). Will send for FFR to try to confirm.  FINDINGS: This study was not adequate for FFR (misregistration artifact) so analysis could not be done.   Echocardiogram 12/29/2017 Study Conclusions  - Left ventricle: The cavity size was normal. Wall thickness was   normal. Systolic function was normal. The estimated ejection   fraction was in the range of 55% to 60%. Although no diagnostic   regional wall motion abnormality was identified, this possibility   cannot be completely excluded on the basis of this study. Doppler   parameters are consistent with abnormal left ventricular   relaxation (grade 1 diastolic dysfunction). - Aortic valve: Mildly calcified annulus.  Trileaflet; mildly   calcified leaflets. There was no significant regurgitation. - Mitral valve: Mildly to moderately calcified annulus. There was   trivial regurgitation. - Right atrium: Central venous pressure (est): 3 mm Hg. - Tricuspid valve: There was trivial regurgitation. - Pulmonary arteries: Systolic pressure could not be accurately   estimated. - Pericardium, extracardiac: A prominent pericardial fat pad was   present.  Labs/Other Tests and Data Reviewed:    EKG:  An ECG dated 12/15/2017 was personally reviewed today and demonstrated:  sinus rhythm with LBBB  Recent Labs: 01/16/2018: TSH 2.330 03/01/2018: ALT 16; BUN 19; Creatinine, Ser 1.02; Hemoglobin 9.3; Platelets 229; Potassium 4.2; Sodium 135   Recent Lipid Panel Lab Results  Component Value Date/Time   CHOL 115 06/15/2016 05:01 AM   TRIG 98 06/15/2016 05:01 AM   HDL 33 (L) 06/15/2016 05:01 AM   CHOLHDL 3.5 06/15/2016 05:01 AM   LDLCALC 62 06/15/2016 05:01 AM  Wt Readings from Last 3 Encounters:  05/29/18 195 lb (88.5 kg)  03/24/18 199 lb 12.8 oz (90.6 kg)  03/01/18 203 lb 6.4 oz (92.3 kg)     Objective:    Vital Signs:  BP 137/66    Pulse 71    Temp 98 F (36.7 C)    Ht 5\' 9"  (1.753 m)    Wt 195 lb (88.5 kg)    BMI 28.80 kg/m    Pt in no acute distress. Pulm: Pt able to speak in complete sentences. No audible wheezing or dyspnea.   ASSESSMENT & PLAN:    Chronic diastolic heart fialure -Pt had increased DOE in late 2019. Obstructive CAD found on CCTA which could be contributing to his symptoms, however, symptoms significantly improved with discontinuation of amiodarone.  -Echo 12/2017 showed normal LV systolic function, grade 1 dd, no significant valvular abnormalities.  -He continues on lasix 20 mg daily, metoprolol.  -No further DOE. No orthopnea or edema. Pt very stable.   CAD -Pt with hx of PCI in 1992 at [facility], no intervention since then.  -Pt evaluated for exertional dyspnea in 01/2018.  CCTA showed significant coronary calcifications. Symptoms improved with d/c amiodarone. With improved sx, coronary angiography deferred per pt preference.  -Not on statin. Not on aspirin due to need for anticoagulation.  -LDL was 62 in 06/2016. -Pt is feeling better then he has in a while. Encouraged to continue with heart healthy diet and exercise as much as he can with the COVID restrictions.  -Pt reminded to call our office if he experiences DOE or chest pain/pressure.  Paroxysmal atrial fibrillation -Now off amiodarone due to poss pulmonary toxicity with DOE.  -Asymptomatic -Pt had been on BB and diltiazem in the past. If needed could increase BB or add diltiazem if needed for rate control. Currently does not seem to need.  -On Xarelto for stroke risk reduction for CHA2DS2/VAS Stroke Risk Score of 6 (CHF, vasc dz, HTN, age (2), DM). Renal function normal by last labs in 02/2018. No unusual bleeding noted.  -TSH normal in 01/2018.   Hypertension -On metoprolol 25 mg BID and lasix 20 mg daily -BP well controlled.   COVID-19 Education: The signs and symptoms of COVID-19 were discussed with the patient and how to seek care for testing (follow up with PCP or arrange E-visit).  The importance of social distancing was discussed today.  Time:   Today, I have spent 18 minutes with the patient with telehealth technology discussing the above problems.     Medication Adjustments/Labs and Tests Ordered: Current medicines are reviewed at length with the patient today.  Concerns regarding medicines are outlined above.  Tests Ordered: No orders of the defined types were placed in this encounter.  Medication Changes: No orders of the defined types were placed in this encounter.   Disposition:  Follow up in 6 month(s)  Signed, Daune Perch, NP  05/29/2018 2:50 PM    [facility]

## 2018-05-29 NOTE — Patient Instructions (Addendum)
Medication Instructions:   Your physician recommends that you continue on your current medications as directed. Please refer to the Current Medication list given to you today.  Labwork:  NONE  Testing/Procedures:  NONE  Follow-Up:  Your physician recommends that you schedule a follow-up appointment in: 6 months. You will receive a reminder letter in the mail in about 4 months reminding you to call and schedule your appointment. If you don't receive this letter, please contact our office.  Any Other Special Instructions Will Be Listed Below (If Applicable).  If you need a refill on your cardiac medications before your next appointment, please call your pharmacy.   DASH Eating Plan DASH stands for "Dietary Approaches to Stop Hypertension." The DASH eating plan is a healthy eating plan that has been shown to reduce high blood pressure (hypertension). It may also reduce your risk for type 2 diabetes, heart disease, and stroke. The DASH eating plan may also help with weight loss. What are tips for following this plan?  General guidelines  Avoid eating more than 2,300 mg (milligrams) of salt (sodium) a day. If you have hypertension, you may need to reduce your sodium intake to 1,500 mg a day.  Limit alcohol intake to no more than 1 drink a day for nonpregnant women and 2 drinks a day for men. One drink equals 12 oz of beer, 5 oz of wine, or 1 oz of hard liquor.  Work with your health care provider to maintain a healthy body weight or to lose weight. Ask what an ideal weight is for you.  Get at least 30 minutes of exercise that causes your heart to beat faster (aerobic exercise) most days of the week. Activities may include walking, swimming, or biking.  Work with your health care provider or diet and nutrition specialist (dietitian) to adjust your eating plan to your individual calorie needs. Reading food labels   Check food labels for the amount of sodium per serving. Choose  foods with less than 5 percent of the Daily Value of sodium. Generally, foods with less than 300 mg of sodium per serving fit into this eating plan.  To find whole grains, look for the word "whole" as the first word in the ingredient list. Shopping  Buy products labeled as "low-sodium" or "no salt added."  Buy fresh foods. Avoid canned foods and premade or frozen meals. Cooking  Avoid adding salt when cooking. Use salt-free seasonings or herbs instead of table salt or sea salt. Check with your health care provider or pharmacist before using salt substitutes.  Do not fry foods. Cook foods using healthy methods such as baking, boiling, grilling, and broiling instead.  Cook with heart-healthy oils, such as olive, canola, soybean, or sunflower oil. Meal planning  Eat a balanced diet that includes: ? 5 or more servings of fruits and vegetables each day. At each meal, try to fill half of your plate with fruits and vegetables. ? Up to 6-8 servings of whole grains each day. ? Less than 6 oz of lean meat, poultry, or fish each day. A 3-oz serving of meat is about the same size as a deck of cards. One egg equals 1 oz. ? 2 servings of low-fat dairy each day. ? A serving of nuts, seeds, or beans 5 times each week. ? Heart-healthy fats. Healthy fats called Omega-3 fatty acids are found in foods such as flaxseeds and coldwater fish, like sardines, salmon, and mackerel.  Limit how much you eat of the following: ?  Canned or prepackaged foods. ? Food that is high in trans fat, such as fried foods. ? Food that is high in saturated fat, such as fatty meat. ? Sweets, desserts, sugary drinks, and other foods with added sugar. ? Full-fat dairy products.  Do not salt foods before eating.  Try to eat at least 2 vegetarian meals each week.  Eat more home-cooked food and less restaurant, buffet, and fast food.  When eating at a restaurant, ask that your food be prepared with less salt or no salt, if  possible. What foods are recommended? The items listed may not be a complete list. Talk with your dietitian about what dietary choices are best for you. Grains Whole-grain or whole-wheat bread. Whole-grain or whole-wheat pasta. Brown rice. [place]. Bulgur. Whole-grain and low-sodium cereals. Pita bread. Low-fat, low-sodium crackers. Whole-wheat flour tortillas. Vegetables Fresh or frozen vegetables (raw, steamed, roasted, or grilled). Low-sodium or reduced-sodium tomato and vegetable juice. Low-sodium or reduced-sodium tomato sauce and tomato paste. Low-sodium or reduced-sodium canned vegetables. Fruits All fresh, dried, or frozen fruit. Canned fruit in natural juice (without added sugar). Meat and other protein foods Skinless chicken or [place]. Ground chicken or [place]. Pork with fat trimmed off. Fish and seafood. Egg whites. Dried beans, peas, or lentils. Unsalted nuts, nut butters, and seeds. Unsalted canned beans. Lean cuts of beef with fat trimmed off. Low-sodium, lean deli meat. Dairy Low-fat (1%) or fat-free (skim) milk. Fat-free, low-fat, or reduced-fat cheeses. Nonfat, low-sodium ricotta or cottage cheese. Low-fat or nonfat yogurt. Low-fat, low-sodium cheese. Fats and oils Soft margarine without trans fats. Vegetable oil. Low-fat, reduced-fat, or light mayonnaise and salad dressings (reduced-sodium). Canola, safflower, olive, soybean, and sunflower oils. Avocado. Seasoning and other foods Herbs. Spices. Seasoning mixes without salt. Unsalted popcorn and pretzels. Fat-free sweets. What foods are not recommended? The items listed may not be a complete list. Talk with your dietitian about what dietary choices are best for you. Grains Baked goods made with fat, such as croissants, muffins, or some breads. Dry pasta or rice meal packs. Vegetables Creamed or fried vegetables. Vegetables in a cheese sauce. Regular canned vegetables (not low-sodium or reduced-sodium). Regular canned  tomato sauce and paste (not low-sodium or reduced-sodium). Regular tomato and vegetable juice (not low-sodium or reduced-sodium). Angie Fava. Olives. Fruits Canned fruit in a light or heavy syrup. Fried fruit. Fruit in cream or butter sauce. Meat and other protein foods Fatty cuts of meat. Ribs. Fried meat. Berniece Salines. Sausage. Bologna and other processed lunch meats. Salami. Fatback. Hotdogs. Bratwurst. Salted nuts and seeds. Canned beans with added salt. Canned or smoked fish. Whole eggs or egg yolks. Chicken or [place] with skin. Dairy Whole or 2% milk, cream, and half-and-half. Whole or full-fat cream cheese. Whole-fat or sweetened yogurt. Full-fat cheese. Nondairy creamers. Whipped toppings. Processed cheese and cheese spreads. Fats and oils Butter. Stick margarine. Lard. Shortening. Ghee. Bacon fat. Tropical oils, such as coconut, palm kernel, or palm oil. Seasoning and other foods Salted popcorn and pretzels. Onion salt, garlic salt, seasoned salt, table salt, and sea salt. Worcestershire sauce. Tartar sauce. Barbecue sauce. Teriyaki sauce. Soy sauce, including reduced-sodium. Steak sauce. Canned and packaged gravies. Fish sauce. Oyster sauce. Cocktail sauce. Horseradish that you find on the shelf. Ketchup. Mustard. Meat flavorings and tenderizers. Bouillon cubes. Hot sauce and Tabasco sauce. Premade or packaged marinades. Premade or packaged taco seasonings. Relishes. Regular salad dressings. Where to find more information:  National Heart, Lung, and [place]: https://wilson-eaton.com/  American Heart Association: www.heart.org Summary  The DASH eating plan is a healthy eating plan that has been shown to reduce high blood pressure (hypertension). It may also reduce your risk for type 2 diabetes, heart disease, and stroke.  With the DASH eating plan, you should limit salt (sodium) intake to 2,300 mg a day. If you have hypertension, you may need to reduce your sodium intake to 1,500 mg a day.  When  on the DASH eating plan, aim to eat more fresh fruits and vegetables, whole grains, lean proteins, low-fat dairy, and heart-healthy fats.  Work with your health care provider or diet and nutrition specialist (dietitian) to adjust your eating plan to your individual calorie needs. This information is not intended to replace advice given to you by your health care provider. Make sure you discuss any questions you have with your health care provider. Document Released: 01/21/2011 Document Revised: 01/26/2016 Document Reviewed: 01/26/2016 Elsevier Interactive Patient Education  2019 [employer].

## 2018-06-01 ENCOUNTER — Other Ambulatory Visit: Payer: Self-pay

## 2018-06-01 ENCOUNTER — Inpatient Hospital Stay (HOSPITAL_COMMUNITY): Payer: Medicare Other | Attending: Hematology

## 2018-06-01 DIAGNOSIS — Z923 Personal history of irradiation: Secondary | ICD-10-CM | POA: Diagnosis not present

## 2018-06-01 DIAGNOSIS — C8590 Non-Hodgkin lymphoma, unspecified, unspecified site: Secondary | ICD-10-CM

## 2018-06-01 DIAGNOSIS — C8511 Unspecified B-cell lymphoma, lymph nodes of head, face, and neck: Secondary | ICD-10-CM | POA: Diagnosis not present

## 2018-06-01 LAB — FOLATE: Folate: 57.5 ng/mL (ref >5.9)

## 2018-06-01 LAB — COMPREHENSIVE METABOLIC PANEL
ALT: 15 U/L (ref 0–44)
AST: 20 U/L (ref 15–41)
Albumin: 4.2 g/dL (ref 3.5–5.0)
Alkaline Phosphatase: 56 U/L (ref 38–126)
Anion gap: 9 (ref 5–15)
BUN: 18 mg/dL (ref 8–23)
CO2: 27 mmol/L (ref 22–32)
Calcium: 9 mg/dL (ref 8.9–10.3)
Chloride: 100 mmol/L (ref 98–111)
Creatinine, Ser: 0.97 mg/dL (ref 0.61–1.24)
GFR calc Af Amer: >60 mL/min (ref >60)
GFR calc non Af Amer: >60 mL/min (ref >60)
Glucose, Bld: 128 mg/dL — ABNORMAL HIGH (ref 70–99)
Potassium: 4.2 mmol/L (ref 3.5–5.1)
Sodium: 136 mmol/L (ref 135–145)
Total Bilirubin: 0.7 mg/dL (ref 0.3–1.2)
Total Protein: 7.7 g/dL (ref 6.5–8.1)

## 2018-06-01 LAB — IRON AND TIBC
Iron: 61 ug/dL (ref 45–182)
Saturation Ratios: 16 % — ABNORMAL LOW (ref 17.9–39.5)
TIBC: 370 ug/dL (ref 250–450)
UIBC: 309 ug/dL

## 2018-06-01 LAB — CBC WITH DIFFERENTIAL/PLATELET
Abs Immature Granulocytes: 0.02 10*3/uL (ref 0.00–0.07)
Basophils Absolute: 0 10*3/uL (ref 0.0–0.1)
Basophils Relative: 1 %
Eosinophils Absolute: 0.2 10*3/uL (ref 0.0–0.5)
Eosinophils Relative: 4 %
HCT: 40.1 % (ref 39.0–52.0)
Hemoglobin: 12.9 g/dL — ABNORMAL LOW (ref 13.0–17.0)
Immature Granulocytes: 0 %
Lymphocytes Relative: 19 %
Lymphs Abs: 1.1 10*3/uL (ref 0.7–4.0)
MCH: 29.3 pg (ref 26.0–34.0)
MCHC: 32.2 g/dL (ref 30.0–36.0)
MCV: 91.1 fL (ref 80.0–100.0)
Monocytes Absolute: 0.7 10*3/uL (ref 0.1–1.0)
Monocytes Relative: 14 %
Neutro Abs: 3.4 10*3/uL (ref 1.7–7.7)
Neutrophils Relative %: 62 %
Platelets: 182 10*3/uL (ref 150–400)
RBC: 4.4 MIL/uL (ref 4.22–5.81)
RDW: 18.9 % — ABNORMAL HIGH (ref 11.5–15.5)
WBC: 5.5 10*3/uL (ref 4.0–10.5)
nRBC: 0 % (ref 0.0–0.2)

## 2018-06-01 LAB — FERRITIN: Ferritin: 92 ng/mL (ref 24–336)

## 2018-06-01 LAB — VITAMIN B12: Vitamin B-12: 634 pg/mL (ref 180–914)

## 2018-06-08 ENCOUNTER — Inpatient Hospital Stay (HOSPITAL_COMMUNITY): Payer: Medicare Other | Attending: Hematology | Admitting: Hematology

## 2018-06-08 ENCOUNTER — Other Ambulatory Visit: Payer: Self-pay

## 2018-06-08 ENCOUNTER — Encounter (HOSPITAL_COMMUNITY): Payer: Self-pay | Admitting: Hematology

## 2018-06-08 VITALS — BP 133/49 | HR 75 | Temp 97.5°F | Resp 16 | Wt 202.0 lb

## 2018-06-08 DIAGNOSIS — C8511 Unspecified B-cell lymphoma, lymph nodes of head, face, and neck: Secondary | ICD-10-CM

## 2018-06-08 DIAGNOSIS — Z8546 Personal history of malignant neoplasm of prostate: Secondary | ICD-10-CM | POA: Diagnosis not present

## 2018-06-08 DIAGNOSIS — D509 Iron deficiency anemia, unspecified: Secondary | ICD-10-CM | POA: Insufficient documentation

## 2018-06-08 DIAGNOSIS — Z923 Personal history of irradiation: Secondary | ICD-10-CM

## 2018-06-08 DIAGNOSIS — I4891 Unspecified atrial fibrillation: Secondary | ICD-10-CM

## 2018-06-08 DIAGNOSIS — Z79899 Other long term (current) drug therapy: Secondary | ICD-10-CM

## 2018-06-08 DIAGNOSIS — C8590 Non-Hodgkin lymphoma, unspecified, unspecified site: Secondary | ICD-10-CM

## 2018-06-08 DIAGNOSIS — I10 Essential (primary) hypertension: Secondary | ICD-10-CM | POA: Diagnosis not present

## 2018-06-08 NOTE — Assessment & Plan Note (Signed)
1.  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 -Seen by Dr. Nevada Crane who did biopsy of the erythematous area on 09/22/2017 anteriorly and posteriorly which came back as atypical B-cell infiltrate concerning for B-cell lymphoma, possibly follicular type.  CD20 was strongly positive. -Patient does not have any B symptoms including fevers, night sweats or weight loss. -Physical examination did not reveal any palpable adenopathy or hepatosplenomegaly. - PET/CT scan on 10/31/2017 showed left frontal scalp area hypermetabolism with SUV of 3.3.  High density of liver was seen.  No other hypermetabolic activity suggesting lymphoma.  - He underwent radiation therapy to the left scalp lesion from 12/08/2017 through 12/23/2017. - He does not have any B symptoms at this time.  He does not have any palpable adenopathy or splenomegaly. - His blood work was within normal limits.  2.  Iron deficiency anemia: - He received Feraheme on 03/17/2018 and 03/24/2018. - Ferritin improved to 92 and hemoglobin 12.9.  We will see him back in 4 months to monitor his blood counts along with ferritin and iron panel.  3.  Prostate cancer: - Diagnosed 3 to 4 years ago, status post radiation therapy at [facility].

## 2018-06-08 NOTE — Patient Instructions (Signed)
[facility] at [facility] Discharge Instructions  You were seen today by Dr. Katragadda. He went over your recent lab results. He will see you back in 4 months for labs and follow up.   Thank you for choosing [facility] at [facility] to provide your oncology and hematology care.  To afford each patient quality time with our provider, please arrive at least 15 minutes before your scheduled appointment time.   If you have a lab appointment with the Cancer Center please come in thru the  Main Entrance and check in at the main information desk  You need to re-schedule your appointment should you arrive 10 or more minutes late.  We strive to give you quality time with our providers, and arriving late affects you and other patients whose appointments are after yours.  Also, if you no show three or more times for appointments you may be dismissed from the clinic at the providers discretion.     Again, thank you for choosing [facility].  Our hope is that these requests will decrease the amount of time that you wait before being seen by our physicians.       _____________________________________________________________  Should you have questions after your visit to  Cancer Center, please contact our office at [phone] between the hours of 8:00 a.m. and 4:30 p.m.  Voicemails left after 4:00 p.m. will not be returned until the following business day.  For prescription refill requests, have your pharmacy contact our office and allow 72 hours.    Cancer Center Support Programs:   > Cancer Support Group  2nd Tuesday of the month 1pm-2pm, Journey Room    

## 2018-06-08 NOTE — Progress Notes (Signed)
[place] [address]  [address]   CLINIC:  Medical Oncology/Hematology  PCP:  Sandi Mealy, [place] [address] [phone]   REASON FOR VISIT:  Follow-up for B-cell lymphoma of the scalp    INTERVAL HISTORY:  Jesse Murphy 85 y.o. male returns for routine follow-up. He is here today alone. He states that he has been doing well since his last visit. Denies any nausea, vomiting, or diarrhea. Denies any new pains. Had not noticed any recent bleeding such as epistaxis, hematuria or hematochezia. Denies recent chest pain on exertion, shortness of breath on minimal exertion, pre-syncopal episodes, or palpitations. Denies any numbness or tingling in hands or feet. Denies any recent fevers, infections, or recent hospitalizations. Patient reports appetite at 75% and energy level at 50%.    REVIEW OF SYSTEMS:  Review of Systems  All other systems reviewed and are negative.    PAST MEDICAL/SURGICAL HISTORY:  Past Medical History:  Diagnosis Date  . Atrial fibrillation ([place])   . Cancer [facility])    prostate  . Dyspnea   . Hypertension    Past Surgical History:  Procedure Laterality Date  . COLONOSCOPY N/A 08/25/2017   Procedure: COLONOSCOPY;  Surgeon: Rogene Houston, MD;  Location: [facility] ENDO SUITE;  Service: Endoscopy;  Laterality: N/A;  . HERNIA REPAIR    . PROSTATE SURGERY       SOCIAL HISTORY:  Social History   Socioeconomic History  . Marital status: Married    Spouse name: Not on file  . Number of children: 2  . Years of education: Not on file  . Highest education level: Not on file  Occupational History  . 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 Financial resource strain: Not hard at all  . Food insecurity:    Worry: Never true    Inability: Never true  . Transportation needs:    Medical: No    Non-medical: No  Tobacco Use  . Smoking status: Former Smoker    Packs/day:  0.25    Years: 50.00    Pack years: 12.50    Types: Cigarettes    Last attempt to quit: 02/15/1990    Years since quitting: 28.3  . Smokeless tobacco: Never Used  Substance and Sexual Activity  . Alcohol use: No  . Drug use: No  . Sexual activity: Not Currently  Lifestyle  . Physical activity:    Days per week: 0 days    Minutes per session: 0 min  . Stress: Not at all  Relationships  . Social connections:    Talks on phone: Once a week    Gets together: More than three times a week    Attends religious service: 1 to 4 times per year    Active member of club or organization: No    Attends meetings of clubs or organizations: Never    Relationship status: Married  . Intimate partner violence:    Fear of current or ex partner: No    Emotionally abused: No    Physically abused: No    Forced sexual activity: No  Other Topics Concern  . Not on file  Social History Narrative  . Not on file    FAMILY HISTORY:  Family History  Problem Relation Age of Onset  . CAD Mother 92  . CAD Father 13  . CAD Sister   . CAD Brother  CURRENT MEDICATIONS:  Outpatient Encounter Medications as of 06/08/2018  Medication Sig  . acetaminophen (TYLENOL) 325 MG tablet Take 325 mg by mouth every 8 (eight) hours as needed for moderate pain.   . fluticasone (FLONASE) 50 MCG/ACT nasal spray daily as needed.   . furosemide (LASIX) 20 MG tablet Take 20 mg by mouth daily.   Marland Kitchen loratadine (CLARITIN) 10 MG tablet Take 10 mg by mouth daily.  . metoprolol tartrate (LOPRESSOR) 25 MG tablet Take 25 mg by mouth 2 (two) times daily.  . Pediatric Multivitamins-Iron (FLINTSTONES PLUS IRON) chewable tablet Chew 1 tablet by mouth 2 (two) times daily.  . rivaroxaban (XARELTO) 20 MG TABS tablet Take 1 tablet (20 mg total) by mouth daily with supper.  . tamsulosin (FLOMAX) 0.4 MG CAPS capsule Take 0.4 mg by mouth 2 (two) times daily.    No facility-administered encounter medications on file as of 06/08/2018.      ALLERGIES:  No Known Allergies   PHYSICAL EXAM:  ECOG Performance status: 1  Vitals:   06/08/18 1141  BP: (!) 133/49  Pulse: 75  Resp: 16  Temp: (!) 97.5 F (36.4 C)  SpO2: 95%   Filed Weights   06/08/18 1141  Weight: 202 lb (91.6 kg)    Physical Exam Vitals signs reviewed.  Constitutional:      Appearance: Normal appearance.  Cardiovascular:     Rate and Rhythm: Normal rate and regular rhythm.     Heart sounds: Normal heart sounds.  Pulmonary:     Effort: Pulmonary effort is normal.     Breath sounds: Normal breath sounds.  Abdominal:     General: There is no distension.     Palpations: Abdomen is soft. There is no mass.  Musculoskeletal:        General: No swelling.  Skin:    General: Skin is warm.  Neurological:     General: No focal deficit present.     Mental Status: He is alert and oriented to person, place, and time.  Psychiatric:        Mood and Affect: Mood normal.        Behavior: Behavior normal.      LABORATORY DATA:  I have reviewed the labs as listed.  CBC    Component Value Date/Time   WBC 5.5 06/01/2018 1341   RBC 4.40 06/01/2018 1341   HGB 12.9 (L) 06/01/2018 1341   HCT 40.1 06/01/2018 1341   PLT 182 06/01/2018 1341   MCV 91.1 06/01/2018 1341   MCH 29.3 06/01/2018 1341   MCHC 32.2 06/01/2018 1341   RDW 18.9 (H) 06/01/2018 1341   LYMPHSABS 1.1 06/01/2018 1341   MONOABS 0.7 06/01/2018 1341   EOSABS 0.2 06/01/2018 1341   BASOSABS 0.0 06/01/2018 1341   CMP Latest Ref Rng & Units 06/01/2018 03/01/2018 02/22/2018  Glucose 70 - 99 mg/dL 128(H) 121(H) 110(H)  BUN 8 - 23 mg/dL 18 19 14   Creatinine 0.61 - 1.24 mg/dL 0.97 1.02 1.09  Sodium 135 - 145 mmol/L 136 135 136  Potassium 3.5 - 5.1 mmol/L 4.2 4.2 4.3  Chloride 98 - 111 mmol/L 100 99 100  CO2 22 - 32 mmol/L 27 26 27   Calcium 8.9 - 10.3 mg/dL 9.0 8.9 9.1  Total Protein 6.5 - 8.1 g/dL 7.7 7.7 7.6  Total Bilirubin 0.3 - 1.2 mg/dL 0.7 0.6 0.5  Alkaline Phos 38 - 126 U/L 56 57 56  AST  15 - 41 U/L 20 20 22   ALT 0 -  44 U/L 15 16 18        DIAGNOSTIC IMAGING:  I have independently reviewed the scans and discussed with the patient.   I have reviewed Venita Lick LPN's note and agree with the documentation.  I personally performed a face-to-face visit, made revisions and my assessment and plan is as follows.    ASSESSMENT & PLAN:   Non Hodgkin's lymphoma ([place]) 1.  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 -Seen by Dr. Nevada Crane who did biopsy of the erythematous area on 09/22/2017 anteriorly and posteriorly which came back as atypical B-cell infiltrate concerning for B-cell lymphoma, possibly follicular type.  CD20 was strongly positive. -Patient does not have any B symptoms including fevers, night sweats or weight loss. -Physical examination did not reveal any palpable adenopathy or hepatosplenomegaly. - PET/CT scan on 10/31/2017 showed left frontal scalp area hypermetabolism with SUV of 3.3.  High density of liver was seen.  No other hypermetabolic activity suggesting lymphoma.  - He underwent radiation therapy to the left scalp lesion from 12/08/2017 through 12/23/2017. - He does not have any B symptoms at this time.  He does not have any palpable adenopathy or splenomegaly. - His blood work was within normal limits.  2.  Iron deficiency anemia: - He received Feraheme on 03/17/2018 and 03/24/2018. - Ferritin improved to 92 and hemoglobin 12.9.  We will see him back in 4 months to monitor his blood counts along with ferritin and iron panel.  3.  Prostate cancer: - Diagnosed 3 to 4 years ago, status post radiation therapy at [facility].      Orders placed this encounter:  Orders Placed This Encounter  Procedures  . CBC with Differential/Platelet  . Comprehensive metabolic panel  . Iron and TIBC  . Ferritin  . Lactate dehydrogenase      Derek Jack, MD [place] [phone]

## 2018-07-20 ENCOUNTER — Other Ambulatory Visit: Payer: Self-pay | Admitting: Radiation Oncology

## 2018-07-20 ENCOUNTER — Other Ambulatory Visit (HOSPITAL_COMMUNITY): Payer: Self-pay | Admitting: Radiation Oncology

## 2018-07-20 DIAGNOSIS — C8291 Follicular lymphoma, unspecified, lymph nodes of head, face, and neck: Secondary | ICD-10-CM

## 2018-07-27 ENCOUNTER — Encounter (HOSPITAL_COMMUNITY): Payer: Self-pay

## 2018-07-27 ENCOUNTER — Encounter (HOSPITAL_COMMUNITY): Payer: Medicare Other

## 2018-09-12 ENCOUNTER — Encounter (HOSPITAL_COMMUNITY): Payer: Self-pay | Admitting: *Deleted

## 2018-09-12 ENCOUNTER — Emergency Department (HOSPITAL_COMMUNITY): Payer: Medicare Other

## 2018-09-12 ENCOUNTER — Other Ambulatory Visit: Payer: Self-pay

## 2018-09-12 ENCOUNTER — Observation Stay (HOSPITAL_COMMUNITY)
Admission: EM | Admit: 2018-09-12 | Discharge: 2018-09-13 | Disposition: A | Discharge status: Home / Self Care | Payer: Medicare Other | Attending: Internal Medicine | Admitting: Internal Medicine

## 2018-09-12 DIAGNOSIS — I110 Hypertensive heart disease with heart failure: Secondary | ICD-10-CM | POA: Insufficient documentation

## 2018-09-12 DIAGNOSIS — Z20828 Contact with and (suspected) exposure to other viral communicable diseases: Secondary | ICD-10-CM | POA: Insufficient documentation

## 2018-09-12 DIAGNOSIS — I209 Angina pectoris, unspecified: Secondary | ICD-10-CM | POA: Insufficient documentation

## 2018-09-12 DIAGNOSIS — Z8546 Personal history of malignant neoplasm of prostate: Secondary | ICD-10-CM | POA: Insufficient documentation

## 2018-09-12 DIAGNOSIS — Z7901 Long term (current) use of anticoagulants: Secondary | ICD-10-CM | POA: Diagnosis not present

## 2018-09-12 DIAGNOSIS — R739 Hyperglycemia, unspecified: Secondary | ICD-10-CM

## 2018-09-12 DIAGNOSIS — R072 Precordial pain: Principal | ICD-10-CM | POA: Insufficient documentation

## 2018-09-12 DIAGNOSIS — J449 Chronic obstructive pulmonary disease, unspecified: Secondary | ICD-10-CM | POA: Insufficient documentation

## 2018-09-12 DIAGNOSIS — F17210 Nicotine dependence, cigarettes, uncomplicated: Secondary | ICD-10-CM | POA: Insufficient documentation

## 2018-09-12 DIAGNOSIS — R0789 Other chest pain: Secondary | ICD-10-CM

## 2018-09-12 DIAGNOSIS — I480 Paroxysmal atrial fibrillation: Secondary | ICD-10-CM | POA: Diagnosis not present

## 2018-09-12 DIAGNOSIS — J45909 Unspecified asthma, uncomplicated: Secondary | ICD-10-CM | POA: Diagnosis not present

## 2018-09-12 DIAGNOSIS — R079 Chest pain, unspecified: Secondary | ICD-10-CM | POA: Diagnosis present

## 2018-09-12 DIAGNOSIS — I4891 Unspecified atrial fibrillation: Secondary | ICD-10-CM

## 2018-09-12 DIAGNOSIS — I5032 Chronic diastolic (congestive) heart failure: Secondary | ICD-10-CM | POA: Diagnosis not present

## 2018-09-12 DIAGNOSIS — K219 Gastro-esophageal reflux disease without esophagitis: Secondary | ICD-10-CM | POA: Diagnosis not present

## 2018-09-12 LAB — CBC
HCT: 38.2 % — ABNORMAL LOW (ref 39.0–52.0)
Hemoglobin: 12.1 g/dL — ABNORMAL LOW (ref 13.0–17.0)
MCH: 30.3 pg (ref 26.0–34.0)
MCHC: 31.7 g/dL (ref 30.0–36.0)
MCV: 95.5 fL (ref 80.0–100.0)
Platelets: 195 10*3/uL (ref 150–400)
RBC: 4 MIL/uL — ABNORMAL LOW (ref 4.22–5.81)
RDW: 14 % (ref 11.5–15.5)
WBC: 6 10*3/uL (ref 4.0–10.5)
nRBC: 0 % (ref 0.0–0.2)

## 2018-09-12 LAB — PROTIME-INR
INR: 1.8 — ABNORMAL HIGH (ref 0.8–1.2)
Prothrombin Time: 20.7 seconds — ABNORMAL HIGH (ref 11.4–15.2)

## 2018-09-12 LAB — TSH: TSH: 2.338 u[IU]/mL (ref 0.350–4.500)

## 2018-09-12 LAB — BASIC METABOLIC PANEL
Anion gap: 9 (ref 5–15)
BUN: 18 mg/dL (ref 8–23)
CO2: 28 mmol/L (ref 22–32)
Calcium: 8.8 mg/dL — ABNORMAL LOW (ref 8.9–10.3)
Chloride: 101 mmol/L (ref 98–111)
Creatinine, Ser: 1.02 mg/dL (ref 0.61–1.24)
GFR calc Af Amer: >60 mL/min (ref >60)
GFR calc non Af Amer: >60 mL/min (ref >60)
Glucose, Bld: 137 mg/dL — ABNORMAL HIGH (ref 70–99)
Potassium: 4.4 mmol/L (ref 3.5–5.1)
Sodium: 138 mmol/L (ref 135–145)

## 2018-09-12 LAB — SARS CORONAVIRUS 2 BY RT PCR (HOSPITAL ORDER, PERFORMED IN ~~LOC~~ HOSPITAL LAB): SARS Coronavirus 2: NEGATIVE

## 2018-09-12 LAB — TROPONIN I (HIGH SENSITIVITY)
Troponin I (High Sensitivity): 67 ng/L — ABNORMAL HIGH (ref <18)
Troponin I (High Sensitivity): 82 ng/L — ABNORMAL HIGH (ref <18)
Troponin I (High Sensitivity): 82 ng/L — ABNORMAL HIGH (ref <18)
Troponin I (High Sensitivity): 78 ng/L — ABNORMAL HIGH (ref <18)
Troponin I (High Sensitivity): 70 ng/L — ABNORMAL HIGH (ref <18)

## 2018-09-12 LAB — BRAIN NATRIURETIC PEPTIDE: B Natriuretic Peptide: 112 pg/mL — ABNORMAL HIGH (ref 0.0–100.0)

## 2018-09-12 LAB — HEMOGLOBIN A1C
Hgb A1c MFr Bld: 5.9 % — ABNORMAL HIGH (ref 4.8–5.6)
Mean Plasma Glucose: 122.63 mg/dL

## 2018-09-12 LAB — MAGNESIUM: Magnesium: 2 mg/dL (ref 1.7–2.4)

## 2018-09-12 LAB — APTT: aPTT: 44 seconds — ABNORMAL HIGH (ref 24–36)

## 2018-09-12 MED ORDER — ONDANSETRON HCL 4 MG/2ML IJ SOLN: 4.0000 mg | Freq: Four times a day (QID) | INTRAMUSCULAR | Status: DC | PRN

## 2018-09-12 MED ORDER — MONTELUKAST SODIUM 10 MG PO TABS
10.0000 mg | ORAL_TABLET | Freq: Every day | ORAL | Status: DC
  Administered 2018-09-12: 10 mg via ORAL
  Filled 2018-09-12: qty 1

## 2018-09-12 MED ORDER — ALUM & MAG HYDROXIDE-SIMETH 200-200-20 MG/5ML PO SUSP
30.0000 mL | Freq: Once | ORAL | Status: AC
  Administered 2018-09-12: 30 mL via ORAL
  Filled 2018-09-12: qty 30

## 2018-09-12 MED ORDER — ACETAMINOPHEN 325 MG PO TABS: 650.0000 mg | ORAL_TABLET | ORAL | Status: DC | PRN

## 2018-09-12 MED ORDER — NITROGLYCERIN 0.4 MG SL SUBL: 0.4000 mg | SUBLINGUAL_TABLET | SUBLINGUAL | Status: DC | PRN

## 2018-09-12 MED ORDER — FUROSEMIDE 40 MG PO TABS: 20.0000 mg | ORAL_TABLET | Freq: Every day | ORAL | Status: DC

## 2018-09-12 MED ORDER — LORATADINE 10 MG PO TABS
10.0000 mg | ORAL_TABLET | Freq: Every day | ORAL | Status: DC
  Administered 2018-09-13: 10 mg via ORAL
  Filled 2018-09-12: qty 1

## 2018-09-12 MED ORDER — METOPROLOL TARTRATE 25 MG PO TABS
25.0000 mg | ORAL_TABLET | Freq: Two times a day (BID) | ORAL | Status: DC
  Administered 2018-09-12: 20:00:00 25 mg via ORAL
  Filled 2018-09-12: qty 1

## 2018-09-12 MED ORDER — TAMSULOSIN HCL 0.4 MG PO CAPS
0.4000 mg | ORAL_CAPSULE | Freq: Two times a day (BID) | ORAL | Status: DC
  Administered 2018-09-12 – 2018-09-13 (×2): 0.4 mg via ORAL
  Filled 2018-09-12 (×2): qty 1

## 2018-09-12 MED ORDER — LIDOCAINE VISCOUS HCL 2 % MT SOLN
15.0000 mL | Freq: Once | OROMUCOSAL | Status: AC
  Administered 2018-09-12: 15 mL via ORAL
  Filled 2018-09-12: qty 15

## 2018-09-12 MED ORDER — FLUTICASONE PROPIONATE 50 MCG/ACT NA SUSP
1.0000 | Freq: Every day | NASAL | Status: DC | PRN
  Filled 2018-09-12: qty 16

## 2018-09-12 MED ORDER — FUROSEMIDE 20 MG PO TABS
20.0000 mg | ORAL_TABLET | Freq: Every day | ORAL | Status: DC
  Administered 2018-09-13: 20 mg via ORAL
  Filled 2018-09-12: qty 1

## 2018-09-12 MED ORDER — RIVAROXABAN 20 MG PO TABS
20.0000 mg | ORAL_TABLET | Freq: Every day | ORAL | Status: DC
  Administered 2018-09-12: 20 mg via ORAL
  Filled 2018-09-12 (×2): qty 1

## 2018-09-12 MED ORDER — PANTOPRAZOLE SODIUM 40 MG PO TBEC
40.0000 mg | DELAYED_RELEASE_TABLET | Freq: Every day | ORAL | Status: DC
  Administered 2018-09-12 – 2018-09-13 (×2): 40 mg via ORAL
  Filled 2018-09-12 (×2): qty 1

## 2018-09-12 NOTE — H&P (Signed)
History and Physical    Jesse Murphy KKX:[id number] DOB: [birth year] DOA: 09/12/2018  Referring MD/NP/PA: Dr. Kathrynn Humble PCP: Sandi Mealy, MD  Patient coming from: Home  Chief Complaint: Chest pain  HPI: Jesse Murphy is a 85 y.o. male with a past medical history significant for atrial fibrillation (chronically on Xarelto), hypertension, history of prostate cancer, BPH, chronic diastolic dysfunction, prior hx of CAD, prediabetes and asthma/allergy rhinitis; who presented to the emergency department secondary to chest discomfort.  Patient reports experiencing chest discomfort in the middle of his chest, slightly radiated to the left side, 7 out of 10 in intensity, chest pressure in nature, with mild associated dizziness and shortness of breath.  Patient reports some palpitations.  No nausea, no vomiting.  He was returning back to bed from using the bathroom in the morning.  Patient reports that he waited a few hours and even the pain subsided some in intensity never went away.  Around breakfast time he notices that the discomfort severity once again increased at the moment he decided to come to the emergency department for further evaluation and management.  Patient contacted EMS and on their arrival they found in her A. fib with RVR, he was noted to be slightly pale and received  fluid resuscitation.  Patient denies any fever, chills, sick contact, nausea, vomiting, abdominal pain, dysuria, hematuria, melena, hematochezia, headaches, focal weakness or any other complaints.  Which was fluid resuscitation his heart rate significantly improved and at time of evaluation he was still having some atrial fibrillation but was no longer in RVR.  Chest x-ray is negative for acute cardiopulmonary process.  Troponin x2 mildly elevated, no acute ischemic changes on EKG appreciated.  Cardiology was consulted and [facility] asked to bring patient as observation for further evaluation and management of chest pain.   Past Medical/Surgical History: Past Medical History:  Diagnosis Date  . Atrial fibrillation ([place])   . Cancer [facility])    prostate  . Dyspnea   . Hypertension     Past Surgical History:  Procedure Laterality Date  . COLONOSCOPY N/A 08/25/2017   Procedure: COLONOSCOPY;  Surgeon: Rogene Houston, MD;  Location: [facility];  Service: Endoscopy;  Laterality: N/A;  . HERNIA REPAIR    . PROSTATE SURGERY      Social History:  reports that he quit smoking about 28 years ago. His smoking use included cigarettes. He has a 12.50 pack-year smoking history. He has never used smokeless tobacco. He reports that he does not drink alcohol or use drugs.  Allergies: No Known Allergies  Family History:  Family History  Problem Relation Age of Onset  . CAD Mother 14  . CAD Father 21  . CAD Sister   . CAD Brother     Prior to Admission medications   Medication Sig Start Date End Date Taking? Authorizing Provider  acetaminophen (TYLENOL) 325 MG tablet Take 325 mg by mouth every 8 (eight) hours as needed for moderate pain.    Yes [provider]  fluticasone (FLONASE) 50 MCG/ACT nasal spray daily as needed.  10/08/14  Yes [provider]  furosemide (LASIX) 20 MG tablet Take 20 mg by mouth daily.  10/13/17  Yes [provider]  loratadine (CLARITIN) 10 MG tablet Take 10 mg by mouth daily.   Yes [provider]  metoprolol tartrate (LOPRESSOR) 25 MG tablet Take 25 mg by mouth 2 (two) times daily.   Yes [provider]  montelukast (SINGULAIR) 10 MG  tablet Take 1 tablet by mouth at bedtime. 06/27/18  Yes [provider]  Pediatric Multivitamins-Iron (FLINTSTONES PLUS IRON) chewable tablet Chew 1 tablet by mouth 2 (two) times daily. 08/25/17  Yes Rehman, Mechele Dawley, MD  rivaroxaban (XARELTO) 20 MG TABS tablet Take 1 tablet (20 mg total) by mouth daily with supper. 08/28/17  Yes Rehman, Mechele Dawley, MD  tamsulosin (FLOMAX) 0.4 MG CAPS capsule Take 0.4 mg by  mouth 2 (two) times daily.    Yes [provider]    Review of Systems:  Negative except as mentioned in HPI.   Physical Exam: Vitals:   09/12/18 1230 09/12/18 1300 09/12/18 1330 09/12/18 1437  BP: (!) 127/58 (!) 127/59 126/60 134/61  Pulse: 72 70 72 81  Resp: 19 17 17 18   Temp:    (!) 97.5 F (36.4 C)  TempSrc:    Oral  SpO2: 94% 95% 95% 99%  Weight:    90.7 kg  Height:    5\' 9"  (1.753 m)    Constitutional: NAD, calm, comfortable; reports very little discomfort still present in his mid chest.  Breathing stable and at baseline.  No nausea or vomiting. Eyes: PERRL, lids and conjunctivae normal, no icterus, no nystagmus. ENMT: Mucous membranes are moist. Posterior pharynx clear of any exudate or lesions. Neck: normal, supple, no masses, no thyromegaly, no JVD Respiratory: Good air movement bilaterally, no wheezing, positive scattered rhonchi.  No using accessory muscles.  Normal respiratory effort. Cardiovascular: Regular rate and rhythm, no murmurs / rubs / gallops. No extremity edema. No carotid bruits.  Abdomen: no tenderness, no masses palpated. No hepatosplenomegaly. Bowel sounds positive.  Musculoskeletal: no clubbing / cyanosis. No joint deformity upper and lower extremities. Good ROM, no contractures. Normal muscle tone.  Skin: no rashes, lesions, ulcers. No induration Neurologic: CN 2-12 grossly intact. Sensation intact, DTR normal. Strength 5/5 in all 4.  Psychiatric: Normal judgment and insight. Alert and oriented x 3. Normal mood.    Labs on Admission: I have personally reviewed the following labs and imaging studies  CBC: Recent Labs  Lab 09/12/18 0936  WBC 6.0  HGB 12.1*  HCT 38.2*  MCV 95.5  PLT 275   Basic Metabolic Panel: Recent Labs  Lab 09/12/18 0936  NA 138  K 4.4  CL 101  CO2 28  GLUCOSE 137*  BUN 18  CREATININE 1.02  CALCIUM 8.8*  MG 2.0   GFR: Estimated Creatinine Clearance: 56.8 mL/min (by C-G formula based on SCr of 1.02  mg/dL).  Coagulation Profile: Recent Labs  Lab 09/12/18 0936  INR 1.8*   Urine analysis:    Component Value Date/Time   COLORURINE YELLOW 06/14/2016 1019   APPEARANCEUR CLEAR 06/14/2016 1019   LABSPEC 1.017 06/14/2016 1019   PHURINE 6.0 06/14/2016 1019   GLUCOSEU NEGATIVE 06/14/2016 1019   HGBUR NEGATIVE 06/14/2016 1019   BILIRUBINUR NEGATIVE 06/14/2016 1019   KETONESUR NEGATIVE 06/14/2016 1019   PROTEINUR NEGATIVE 06/14/2016 1019   NITRITE NEGATIVE 06/14/2016 1019   LEUKOCYTESUR NEGATIVE 06/14/2016 1019    Recent Results (from the past 240 hour(s))  SARS Coronavirus 2 (CEPHEID - Performed in [place] hospital lab), Hosp Order     Status: None   Collection Time: 09/12/18  9:08 AM   Specimen: Nasopharyngeal Swab  Result Value Ref Range Status   SARS Coronavirus 2 NEGATIVE NEGATIVE Final    Comment: (NOTE) If result is NEGATIVE SARS-CoV-2 target nucleic acids are NOT DETECTED. The SARS-CoV-2 RNA is generally detectable in upper  and lower  respiratory specimens during the acute phase of infection. The lowest  concentration of SARS-CoV-2 viral copies this assay can detect is 250  copies / mL. A negative result does not preclude SARS-CoV-2 infection  and should not be used as the sole basis for treatment or other  patient management decisions.  A negative result may occur with  improper specimen collection / handling, submission of specimen other  than nasopharyngeal swab, presence of viral mutation(s) within the  areas targeted by this assay, and inadequate number of viral copies  (<250 copies / mL). A negative result must be combined with clinical  observations, patient history, and epidemiological information. If result is POSITIVE SARS-CoV-2 target nucleic acids are DETECTED. The SARS-CoV-2 RNA is generally detectable in upper and lower  respiratory specimens dur ing the acute phase of infection.  Positive  results are indicative of active infection with SARS-CoV-2.   Clinical  correlation with patient history and other diagnostic information is  necessary to determine patient infection status.  Positive results do  not rule out bacterial infection or co-infection with other viruses. If result is PRESUMPTIVE POSTIVE SARS-CoV-2 nucleic acids MAY BE PRESENT.   A presumptive positive result was obtained on the submitted specimen  and confirmed on repeat testing.  While 2019 novel coronavirus  (SARS-CoV-2) nucleic acids may be present in the submitted sample  additional confirmatory testing may be necessary for epidemiological  and / or clinical management purposes  to differentiate between  SARS-CoV-2 and other Sarbecovirus currently known to infect humans.  If clinically indicated additional testing with an alternate test  methodology [phone]) is advised. The SARS-CoV-2 RNA is generally  detectable in upper and lower respiratory sp ecimens during the acute  phase of infection. The expected result is Negative. Fact Sheet for Patients:  StrictlyIdeas.no Fact Sheet for Healthcare Providers: BankingDealers.co.za This test is not yet approved or cleared by the [place] FDA and has been authorized for detection and/or diagnosis of SARS-CoV-2 by FDA under an Emergency Use Authorization (EUA).  This EUA will remain in effect (meaning this test can be used) for the duration of the COVID-19 declaration under Section 564(b)(1) of the Act, 21 U.S.C. section 360bbb-3(b)(1), unless the authorization is terminated or revoked sooner. Performed at [facility], [address], [place], [place] [address]      Radiological Exams on Admission: Dg Chest [facility] 1 View  Result Date: 09/12/2018 CLINICAL DATA:  Chest pain, BILATERAL arm numbness since 0200 hours this morning, cough productive of yellow material for months EXAM: PORTABLE CHEST 1 VIEW COMPARISON:  Portable exam 0920 hours compared to 08/20/2016 FINDINGS:  Normal heart size, mediastinal contours, and pulmonary vascularity. Atherosclerotic calcification aorta. Accentuation of bibasilar interstitial changes, similar to prior exam. Mild central peribronchial thickening. No infiltrate, pleural effusion or pneumothorax. Bones unremarkable. IMPRESSION: Mild bronchitic changes and chronic basilar interstitial prominence. No acute abnormalities. Electronically Signed   By: Lavonia Dana M.D.   On: 09/12/2018 09:53    EKG: Independently reviewed.  Chronic LBBB, no significant changes in comparison to previous tracing.  Normal QT. No acute ischemic changes.   Assessment/Plan 1-chest pain: Heart score of 5, prior history of coronary artery disease. -Patient chest pain significantly improved after controlling his heart rate and receiving pain medication -Mild elevated troponin appreciated -EKG nondiagnostic with chronic LBBB -So far most likely demand ischemia in the setting of uncontrolled atrial fibrillation -Cardiology service has been consulted -Monitor on telemetry bed -Cycle further troponin and follow final  recommendation for inpatient ischemic stratification testing. -Continue metoprolol at adjusted dose as recommended by cardiology. -Continue Xarelto -Heart healthy diet -Start patient on PPI. -check TSH, lipid panel and A1C  2-paroxysmal atrial fibrillation -With acute episode of A. fib with RVR -Patient heart rate is now controlled while in the ED after receiving fluid resuscitation -Following cardiology recommendation metoprolol has been increased to 37.5 twice a day -Continue Xarelto -Check electrolytes and try to maintain potassium above 4 and magnesium above 2.  3-BPH -No complaints of urinary retention -Continue Flomax  4-history of COPD/asthma -Reports breathing at baseline -Currently no wheezing. -Continue the use of Singulair, Claritin and Flonase  5-essential hypertension -Continue metoprolol and low-dose Lasix -With  intention to resume Lasix one in am. -gentle IVF's given in ED due to complaints of dizziness. -BP stable at this time.   6-hyperglycemia/pre-diabetes -Remote history of prediabetes with A1c of 6.3 in 2018 -Currently not taking medications for blood sugar control -Will repeat fasting glucose and check A1c.  7-chronic diastolic heart failure -Appears to be compensated coronary -Resume the use of Lasix on 09/13/2018 -Follow daily weights and strict I's and O's -Low-sodium diet has been ordered. -Most recent echo in November 2019, demonstrating ejection fraction 55 to 40%, grade 1 diastolic dysfunction, no diagnostic regional wall motion normalities identified at that time.  No significant valvular disorder.  DVT prophylaxis: chronically on Xarelto Code Status: Full code Family Communication:  Disposition Plan: To be determined. But anticipate discharge home once medically stable. Consults called: cardiology service Admission status: Observation, telemetry bed, length of stay less than 2 midnights.   Time Spent: 65 minutes   Barton Dubois MD [facility] Pager [phone]   09/12/2018, 3:57 PM

## 2018-09-12 NOTE — ED Notes (Signed)
ED Provider at bedside. 

## 2018-09-12 NOTE — ED Notes (Signed)
ED TO INPATIENT HANDOFF REPORT  ED Nurse Name and Phone #: [phone]  S Name/Age/Gender Jesse Murphy 85 y.o. male Room/Bed: APA02/APA02  Code Status   Code Status: Full Code  Home/SNF/Other Home Patient oriented to: self Is this baseline? Yes   Triage Complete: Triage complete  Chief Complaint afib  Triage Note Pt brought in by [facility] from home with c/o chest pain, bilateral arm numbness since 0200 this morning. EMS reports pt was somewhat altered and pale on scene but was able to answer most questions. Pt was found to be in A. Fib with RVR with HR between 136-181bpm. EMS reports BP 127/95 and then upon standing dropped to 94/55. CBG 136. Pt's HR decreased into the 90's after fluid administration. Pt reports productive yellow cough for months. Pt also reports SOB but reports it is his normal.     Allergies No Known Allergies  Level of Care/Admitting Diagnosis ED Disposition    ED Disposition Condition [place]: [facility] [[id number]]  Level of Care: Telemetry [5]  Covid Evaluation: Confirmed COVID Negative  Diagnosis: Chest pain [528413]  Admitting Physician: Barton Dubois [[id number]]  Attending Physician: Barton Dubois [[id number]]  PT Class (Do Not Modify): Observation [104]  PT Acc Code (Do Not Modify): Observation [10022]       B Medical/Surgery History Past Medical History:  Diagnosis Date  . Atrial fibrillation ([place])   . Cancer [facility])    prostate  . Dyspnea   . Hypertension    Past Surgical History:  Procedure Laterality Date  . COLONOSCOPY N/A 08/25/2017   Procedure: COLONOSCOPY;  Surgeon: Rogene Houston, MD;  Location[facility];  Service: Endoscopy;  Laterality: N/A;  . HERNIA REPAIR    . PROSTATE SURGERY       A IV Location/Drains/Wounds Patient Lines/Drains/Airways Status   Active Line/Drains/Airways    Name:   Placement date:   Placement time:   Site:   Days:   Peripheral IV 09/12/18 Right Antecubital   09/12/18     0854    Antecubital   less than 1          Intake/Output Last 24 hours  Intake/Output Summary (Last 24 hours) at 09/12/2018 1329 Last data filed at 09/12/2018 0854 Gross per 24 hour  Intake 300 ml  Output -  Net 300 ml    Labs/Imaging Results for orders placed or performed during the hospital encounter of 09/12/18 (from the past 48 hour(s))  SARS Coronavirus 2 (CEPHEID - Performed in [place] hospital lab), Hosp Order     Status: None   Collection Time: 09/12/18  9:08 AM   Specimen: Nasopharyngeal Swab  Result Value Ref Range   SARS Coronavirus 2 NEGATIVE NEGATIVE    Comment: (NOTE) If result is NEGATIVE SARS-CoV-2 target nucleic acids are NOT DETECTED. The SARS-CoV-2 RNA is generally detectable in upper and lower  respiratory specimens during the acute phase of infection. The lowest  concentration of SARS-CoV-2 viral copies this assay can detect is 250  copies / mL. A negative result does not preclude SARS-CoV-2 infection  and should not be used as the sole basis for treatment or other  patient management decisions.  A negative result may occur with  improper specimen collection / handling, submission of specimen other  than nasopharyngeal swab, presence of viral mutation(s) within the  areas targeted by this assay, and inadequate number of viral copies  (<250 copies / mL). A negative result must be  combined with clinical  observations, patient history, and epidemiological information. If result is POSITIVE SARS-CoV-2 target nucleic acids are DETECTED. The SARS-CoV-2 RNA is generally detectable in upper and lower  respiratory specimens dur ing the acute phase of infection.  Positive  results are indicative of active infection with SARS-CoV-2.  Clinical  correlation with patient history and other diagnostic information is  necessary to determine patient infection status.  Positive results do  not rule out bacterial infection or co-infection with other viruses. If result  is PRESUMPTIVE POSTIVE SARS-CoV-2 nucleic acids MAY BE PRESENT.   A presumptive positive result was obtained on the submitted specimen  and confirmed on repeat testing.  While 2019 novel coronavirus  (SARS-CoV-2) nucleic acids may be present in the submitted sample  additional confirmatory testing may be necessary for epidemiological  and / or clinical management purposes  to differentiate between  SARS-CoV-2 and other Sarbecovirus currently known to infect humans.  If clinically indicated additional testing with an alternate test  methodology [phone]) is advised. The SARS-CoV-2 RNA is generally  detectable in upper and lower respiratory sp ecimens during the acute  phase of infection. The expected result is Negative. Fact Sheet for Patients:  StrictlyIdeas.no Fact Sheet for Healthcare Providers: BankingDealers.co.za This test is not yet approved or cleared by the [place] FDA and has been authorized for detection and/or diagnosis of SARS-CoV-2 by FDA under an Emergency Use Authorization (EUA).  This EUA will remain in effect (meaning this test can be used) for the duration of the COVID-19 declaration under Section 564(b)(1) of the Act, 21 U.S.C. section 360bbb-3(b)(1), unless the authorization is terminated or revoked sooner. Performed at [facility], [address], [place], [place] [address]   Basic metabolic panel     Status: Abnormal   Collection Time: 09/12/18  9:36 AM  Result Value Ref Range   Sodium 138 135 - 145 mmol/L   Potassium 4.4 3.5 - 5.1 mmol/L   Chloride 101 98 - 111 mmol/L   CO2 28 22 - 32 mmol/L   Glucose, Bld 137 (H) 70 - 99 mg/dL   BUN 18 8 - 23 mg/dL   Creatinine, Ser 1.02 0.61 - 1.24 mg/dL   Calcium 8.8 (L) 8.9 - 10.3 mg/dL   GFR calc non Af Amer >60 >60 mL/min   GFR calc Af Amer >60 >60 mL/min   Anion gap 9 5 - 15    Comment: Performed at [facility], [address] [place], [place] [address]   Magnesium     Status: None   Collection Time: 09/12/18  9:36 AM  Result Value Ref Range   Magnesium 2.0 1.7 - 2.4 mg/dL    Comment: Performed at [facility], [address] [place], [place] [address]  CBC     Status: Abnormal   Collection Time: 09/12/18  9:36 AM  Result Value Ref Range   WBC 6.0 4.0 - 10.5 K/uL   RBC 4.00 (L) 4.22 - 5.81 MIL/uL   Hemoglobin 12.1 (L) 13.0 - 17.0 g/dL   HCT 38.2 (L) 39.0 - 52.0 %   MCV 95.5 80.0 - 100.0 fL   MCH 30.3 26.0 - 34.0 pg   MCHC 31.7 30.0 - 36.0 g/dL   RDW 14.0 11.5 - 15.5 %   Platelets 195 150 - 400 K/uL   nRBC 0.0 0.0 - 0.2 %    Comment: Performed at [facility], [address], [place], [place] [address]  Troponin I (High Sensitivity)  Status: Abnormal   Collection Time: 09/12/18  9:36 AM  Result Value Ref Range   Troponin I (High Sensitivity) 67 (H) <18 ng/L    Comment: (NOTE) Elevated high sensitivity troponin I (hsTnI) values and significant  changes across serial measurements may suggest ACS but many other  chronic and acute conditions are known to elevate hsTnI results.  Refer to the "Links" section for chest pain algorithms and additional  guidance. Performed at [facility], [address] [place], [place] [address]   Protime-INR     Status: Abnormal   Collection Time: 09/12/18  9:36 AM  Result Value Ref Range   Prothrombin Time 20.7 (H) 11.4 - 15.2 seconds   INR 1.8 (H) 0.8 - 1.2    Comment: (NOTE) INR goal varies based on device and disease states. Performed at [facility], [address] [place], [place] [address]   APTT     Status: Abnormal   Collection Time: 09/12/18  9:36 AM  Result Value Ref Range   aPTT 44 (H) 24 - 36 seconds    Comment:        IF BASELINE aPTT IS ELEVATED, SUGGEST PATIENT RISK ASSESSMENT BE USED TO DETERMINE APPROPRIATE ANTICOAGULANT THERAPY. Performed at [facility], [address], [place], [place] [address]   Brain natriuretic peptide (IF shortness of breath has been documented  this visit)     Status: Abnormal   Collection Time: 09/12/18  9:42 AM  Result Value Ref Range   B Natriuretic Peptide 112.0 (H) 0.0 - 100.0 pg/mL    Comment: Performed at [facility], [address], [place], [place] [address]  Troponin I (High Sensitivity)     Status: Abnormal   Collection Time: 09/12/18 11:24 AM  Result Value Ref Range   Troponin I (High Sensitivity) 82 (H) <18 ng/L    Comment: (NOTE) Elevated high sensitivity troponin I (hsTnI) values and significant  changes across serial measurements may suggest ACS but many other  chronic and acute conditions are known to elevate hsTnI results.  Refer to the "Links" section for chest pain algorithms and additional  guidance. Performed at [facility], [address], [place], [place] [address]    Dg Chest Port 1 View  Result Date: 09/12/2018 CLINICAL DATA:  Chest pain, BILATERAL arm numbness since 0200 hours this morning, cough productive of yellow material for months EXAM: PORTABLE CHEST 1 VIEW COMPARISON:  Portable exam 0920 hours compared to 08/20/2016 FINDINGS: Normal heart size, mediastinal contours, and pulmonary vascularity. Atherosclerotic calcification aorta. Accentuation of bibasilar interstitial changes, similar to prior exam. Mild central peribronchial thickening. No infiltrate, pleural effusion or pneumothorax. Bones unremarkable. IMPRESSION: Mild bronchitic changes and chronic basilar interstitial prominence. No acute abnormalities. Electronically Signed   By: Lavonia Dana M.D.   On: 09/12/2018 09:53    Pending Labs Unresulted Labs (From admission, onward)   None      Vitals/Pain Today's Vitals   09/12/18 1130 09/12/18 1200 09/12/18 1230 09/12/18 1300  BP: (!) 126/59 132/63 (!) 127/58 (!) 127/59  Pulse: 71 66 72 70  Resp: 18 18 19 17   Temp:      TempSrc:      SpO2: 96% 96% 94% 95%  Weight:      Height:      PainSc:        Isolation Precautions No active isolations  Medications Medications   nitroGLYCERIN (NITROSTAT) SL tablet 0.4 mg (has no administration in time range)  acetaminophen (TYLENOL) tablet 650 mg (has no  administration in time range)  ondansetron (ZOFRAN) injection 4 mg (has no administration in time range)  alum & mag hydroxide-simeth (MAALOX/MYLANTA) 200-200-20 MG/5ML suspension 30 mL (has no administration in time range)    And  lidocaine (XYLOCAINE) 2 % viscous mouth solution 15 mL (has no administration in time range)  metoprolol tartrate (LOPRESSOR) tablet 25 mg (has no administration in time range)  rivaroxaban (XARELTO) tablet 20 mg (has no administration in time range)  tamsulosin (FLOMAX) capsule 0.4 mg (has no administration in time range)  fluticasone (FLONASE) 50 MCG/ACT nasal spray 1 spray (has no administration in time range)  loratadine (CLARITIN) tablet 10 mg (has no administration in time range)  montelukast (SINGULAIR) tablet 10 mg (has no administration in time range)  furosemide (LASIX) tablet 20 mg (has no administration in time range)  pantoprazole (PROTONIX) EC tablet 40 mg (has no administration in time range)    Mobility walks Low fall risk   Focused Assessments    R Recommendations: See Admitting Provider Note  Report given to:   Additional Notes:

## 2018-09-12 NOTE — Consult Note (Signed)
Cardiology Consultation:   Patient ID: Jesse Murphy MRN: [id number]; DOB: [birth year]  Admit date: 09/12/2018 Date of Consult: 09/12/2018  Primary Care Provider: Sandi Mealy, MD Primary Cardiologist: Kate Sable, MD  Primary Electrophysiologist:  None    Patient Profile:   Jesse Murphy is a 85 y.o. male with a hx of PAF and CAD who is being seen today for the evaluation of chest pain at the request of Dr Dyann Kief  History of Present Illness:   Jesse Murphy is an 85 yo male history of CAD, chronic diastolic HF, PAF admitted with chest pain. From ER report patient was found by EMS initialyl to be in afib with RVR, self converted. I reviewed EMS strips and confirm afib with RVR, also some afib with RVR noted in ER by tele  Reports symptoms started around 1AM after getting back into bed from the bathroom. 7/10 left sided chest pressure, SOB, dizziness. Some palpitations. Symptoms lasted a few minutes, then lightened up but did not go away. Few hours later to up for breakfast, while fixing his breakfast has a recurrent similar episode. Pain constant for about 8 hours though varied in severity. In ER pain self resolved.    K 4.4 Cr 1.02 BUN 18 Mg 2 WBC 6 Hgb 12.1 Plt 195 BNP 112  hstrop 67-->82--> CXR no acute process EKG SR, chronic LBBB COVID neg   01/2018 coronary CTA: possible severe stenosis mid RCA and mid LAD and ostial ramus. Technically could not perform FFR 12/2017 echo: LVEF 40-08%, grade I diastolic dysfunction      Heart Pathway Score:  HEAR Score: 5  Past Medical History:  Diagnosis Date  . Atrial fibrillation ([place])   . Cancer [facility])    prostate  . Dyspnea   . Hypertension     Past Surgical History:  Procedure Laterality Date  . COLONOSCOPY N/A 08/25/2017   Procedure: COLONOSCOPY;  Surgeon: Rogene Houston, MD;  Location: [facility];  Service: Endoscopy;  Laterality: N/A;  . HERNIA REPAIR    . PROSTATE SURGERY       Inpatient Medications:  Scheduled Meds: . alum & mag hydroxide-simeth  30 mL Oral Once   And  . lidocaine  15 mL Oral Once  . [START ON 09/13/2018] furosemide  20 mg Oral Daily  . loratadine  10 mg Oral Daily  . metoprolol tartrate  25 mg Oral BID  . montelukast  10 mg Oral QHS  . pantoprazole  40 mg Oral Daily  . rivaroxaban  20 mg Oral Q supper  . tamsulosin  0.4 mg Oral BID   Continuous Infusions:  PRN Meds: acetaminophen, fluticasone, nitroGLYCERIN, ondansetron (ZOFRAN) IV  Allergies:   No Known Allergies  Social History:   Social History   Socioeconomic History  . Marital status: Married    Spouse name: Not on file  . Number of children: 2  . Years of education: Not on file  . Highest education level: Not on file  Occupational History  . 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 Financial resource strain: Not hard at all  . Food insecurity    Worry: Never true    Inability: Never true  . Transportation needs    Medical: No    Non-medical: No  Tobacco Use  . Smoking status: Former Smoker    Packs/day: 0.25    Years: 50.00    Pack years: 12.50    Types: Cigarettes  Quit date: 02/15/1990    Years since quitting: 28.5  . Smokeless tobacco: Never Used  Substance and Sexual Activity  . Alcohol use: No  . Drug use: No  . Sexual activity: Not Currently  Lifestyle  . Physical activity    Days per week: 0 days    Minutes per session: 0 min  . Stress: Not at all  Relationships  . Social Herbalist on phone: Once a week    Gets together: More than three times a week    Attends religious service: 1 to 4 times per year    Active member of club or organization: No    Attends meetings of clubs or organizations: Never    Relationship status: Married  . Intimate partner violence    Fear of current or ex partner: No    Emotionally abused: No    Physically abused: No    Forced sexual activity: No  Other Topics Concern  . Not on file  Social  History Narrative  . Not on file    Family History:    Family History  Problem Relation Age of Onset  . CAD Mother 42  . CAD Father 16  . CAD Sister   . CAD Brother      ROS:  Please see the history of present illness.  All other ROS reviewed and negative.     Physical Exam/Data:   Vitals:   09/12/18 1130 09/12/18 1200 09/12/18 1230 09/12/18 1300  BP: (!) 126/59 132/63 (!) 127/58 (!) 127/59  Pulse: 71 66 72 70  Resp: 18 18 19 17   Temp:      TempSrc:      SpO2: 96% 96% 94% 95%  Weight:      Height:        Intake/Output Summary (Last 24 hours) at 09/12/2018 1333 Last data filed at 09/12/2018 0854 Gross per 24 hour  Intake 300 ml  Output -  Net 300 ml   Last 3 Weights 09/12/2018 06/08/2018 05/29/2018  Weight (lbs) 204 lb 202 lb 195 lb  Weight (kg) 92.534 kg 91.627 kg 88.451 kg     Body mass index is 31.02 kg/m.  General:  Well nourished, well developed, in no acute distress HEENT: normal Lymph: no adenopathy Neck: no JVD Endocrine:  No thryomegaly Vascular: No carotid bruits; FA pulses 2+ bilaterally without bruits  Cardiac:  normal S1, S2; RRR; no murmur  Lungs:  clear to auscultation bilaterally, no wheezing, rhonchi or rales  Abd: soft, nontender, no hepatomegaly  Ext: no edema Musculoskeletal:  No deformities, BUE and BLE strength normal and equal Skin: warm and dry  Neuro:  CNs 2-12 intact, no focal abnormalities noted Psych:  Normal affect    Relevant CV Studies:  Laboratory Data:  High Sensitivity Troponin:   Recent Labs  Lab 09/12/18 0936 09/12/18 1124  TROPONINIHS 67* 82*     Cardiac EnzymesNo results for input(s): TROPONINI in the last 168 hours. No results for input(s): TROPIPOC in the last 168 hours.  Chemistry Recent Labs  Lab 09/12/18 0936  NA 138  K 4.4  CL 101  CO2 28  GLUCOSE 137*  BUN 18  CREATININE 1.02  CALCIUM 8.8*  GFRNONAA >60  GFRAA >60  ANIONGAP 9    No results for input(s): PROT, ALBUMIN, AST, ALT, ALKPHOS,  BILITOT in the last 168 hours. Hematology Recent Labs  Lab 09/12/18 0936  WBC 6.0  RBC 4.00*  HGB 12.1*  HCT 38.2*  MCV 95.5  MCH 30.3  MCHC 31.7  RDW 14.0  PLT 195   BNP Recent Labs  Lab 09/12/18 0942  BNP 112.0*    DDimer No results for input(s): DDIMER in the last 168 hours.   Radiology/Studies:  Dg Chest Port 1 View  Result Date: 09/12/2018 CLINICAL DATA:  Chest pain, BILATERAL arm numbness since 0200 hours this morning, cough productive of yellow material for months EXAM: PORTABLE CHEST 1 VIEW COMPARISON:  Portable exam 0920 hours compared to 08/20/2016 FINDINGS: Normal heart size, mediastinal contours, and pulmonary vascularity. Atherosclerotic calcification aorta. Accentuation of bibasilar interstitial changes, similar to prior exam. Mild central peribronchial thickening. No infiltrate, pleural effusion or pneumothorax. Bones unremarkable. IMPRESSION: Mild bronchitic changes and chronic basilar interstitial prominence. No acute abnormalities. Electronically Signed   By: Lavonia Dana M.D.   On: 09/12/2018 09:53    Assessment and Plan:   1. Chest pain/History of CAD - admitted with several hours of constant though varying in severity chest pressure, SOB, dizziness, palpitations.  - in afib with RVR at initial EMS evaluation and also some runs in the ER, this may have been to origin alone of his symptoms or perhaps in combiation with his chronic obstructive CAD. Symptoms more suggestive of symptomatic arrhythmia as opposed to ACS Mild elevated hstrop with mild delta - would monitor overnight with cycling of trops. EKG is nondiagnostic with chronic LBBB - unless significant increase in hstrop would not anticipate ischemic testing.    2. PAF - episodes of afib with RVR by EMS and in ER - would increase his home lopressor to 37.5mg  bid, monitor on tele overnight - continue xarelto, calcualted CrCl is 67, 20mg  daily is the appropriate dose.     For questions or updates,  please contact [facility] Please consult [email or web address] for contact info under     Signed, Carlyle Dolly, MD  09/12/2018 1:33 PM

## 2018-09-12 NOTE — ED Provider Notes (Signed)
[facility] EMERGENCY DEPARTMENT Provider Note   CSN: [id number] Arrival date & time: 09/12/18  0844    History   Chief Complaint Chief Complaint  Patient presents with  . Chest Pain    HPI Jesse Murphy is a 85 y.o. male.  HPI: A 85 year old patient with a history of hypertension presents for evaluation of chest pain. Initial onset of pain was more than 6 hours ago. The patient's chest pain is described as heaviness/pressure/tightness and is not worse with exertion. The patient's chest pain is middle- or left-sided, is not well-localized, is not sharp and does not radiate to the arms/jaw/neck. The patient does not complain of nausea and denies diaphoresis. The patient has no history of stroke, has no history of peripheral artery disease, has not smoked in the past 90 days, denies any history of treated diabetes, has no relevant family history of coronary artery disease (first degree relative at less than age 54), has no history of hypercholesterolemia and does not have an elevated BMI (>=30).   Patient reports that he started having chest pain around 1:00 in the morning while he was asleep.  The pain is been constant since then with intermittent waxing and waning intensity.  The pain is midsternal and he has some numbness in his fingers.  He has had similar symptoms in the past with A. fib and he does have some palpitations.  Patient has been taking his medications as prescribed and denies any recent illnesses besides a productive cough for the last 2 to 3 weeks.  HPI  Past Medical History:  Diagnosis Date  . Atrial fibrillation ([place])   . Cancer [facility])    prostate  . Dyspnea   . Hypertension     Patient Active Problem List   Diagnosis Date Noted  . Chronic diastolic heart failure ([place]) 05/29/2018  . Hypertension 05/29/2018  . Non Hodgkin's lymphoma ([place]) 09/30/2017  . Heme positive stool 08/11/2017  . Chronic anticoagulation 07/05/2016  . CAD S/P percutaneous coronary  angioplasty 07/05/2016  . Type 2 diabetes mellitus without complications ([place]) 91/79/1505  . Paroxysmal atrial fibrillation ([place]) 06/14/2016  . Chest pain in adult 06/14/2016  . Dyspnea on exertion 06/14/2016  . Allergic rhinitis 06/14/2016  . GERD (gastroesophageal reflux disease) 06/14/2016  . BPH (benign prostatic hyperplasia) 06/14/2016    Past Surgical History:  Procedure Laterality Date  . COLONOSCOPY N/A 08/25/2017   Procedure: COLONOSCOPY;  Surgeon: Rogene Houston, MD;  Location[facility];  Service: Endoscopy;  Laterality: N/A;  . HERNIA REPAIR    . PROSTATE SURGERY          Home Medications    Prior to Admission medications   Medication Sig Start Date End Date Taking? Authorizing Provider  acetaminophen (TYLENOL) 325 MG tablet Take 325 mg by mouth every 8 (eight) hours as needed for moderate pain.    Yes [provider]  fluticasone (FLONASE) 50 MCG/ACT nasal spray daily as needed.  10/08/14  Yes [provider]  furosemide (LASIX) 20 MG tablet Take 20 mg by mouth daily.  10/13/17  Yes [provider]  loratadine (CLARITIN) 10 MG tablet Take 10 mg by mouth daily.   Yes [provider]  metoprolol tartrate (LOPRESSOR) 25 MG tablet Take 25 mg by mouth 2 (two) times daily.   Yes [provider]  montelukast (SINGULAIR) 10 MG tablet Take 1 tablet by mouth at bedtime. 06/27/18  Yes [provider]  Pediatric Multivitamins-Iron (FLINTSTONES PLUS IRON) chewable  tablet Chew 1 tablet by mouth 2 (two) times daily. 08/25/17  Yes Rehman, Mechele Dawley, MD  rivaroxaban (XARELTO) 20 MG TABS tablet Take 1 tablet (20 mg total) by mouth daily with supper. 08/28/17  Yes Rehman, Mechele Dawley, MD  tamsulosin (FLOMAX) 0.4 MG CAPS capsule Take 0.4 mg by mouth 2 (two) times daily.    Yes [provider]    Family History Family History  Problem Relation Age of Onset  . CAD Mother 23  . CAD Father 49  . CAD Sister   . CAD Brother      Social History Social History   Tobacco Use  . Smoking status: Former Smoker    Packs/day: 0.25    Years: 50.00    Pack years: 12.50    Types: Cigarettes    Quit date: 02/15/1990    Years since quitting: 28.5  . Smokeless tobacco: Never Used  Substance Use Topics  . Alcohol use: No  . Drug use: No     Allergies   Patient has no known allergies.   Review of Systems Review of Systems  Constitutional: Positive for activity change and fatigue.  Respiratory: Positive for cough.   Cardiovascular: Positive for chest pain and palpitations.  Neurological: Negative for dizziness.  Hematological: Bruises/bleeds easily.  All other systems reviewed and are negative.    Physical Exam Updated Vital Signs BP 132/63   Pulse 66   Temp 97.8 F (36.6 C) (Oral)   Resp 18   Ht 5\' 8"  (1.727 m)   Wt 92.5 kg   SpO2 96%   BMI 31.02 kg/m   Physical Exam Vitals signs and nursing note reviewed.  Constitutional:      Appearance: He is well-developed.  HENT:     Head: Normocephalic and atraumatic.  Eyes:     Conjunctiva/sclera: Conjunctivae normal.     Pupils: Pupils are equal, round, and reactive to light.  Neck:     Musculoskeletal: Normal range of motion and neck supple.  Cardiovascular:     Rate and Rhythm: Normal rate and regular rhythm.     Heart sounds: Normal heart sounds.  Pulmonary:     Effort: Pulmonary effort is normal.     Breath sounds: Normal breath sounds.  Abdominal:     General: Bowel sounds are normal. There is no distension.     Palpations: Abdomen is soft.  Musculoskeletal:        General: No deformity.  Skin:    General: Skin is warm.  Neurological:     Mental Status: He is alert and oriented to person, place, and time.      ED Treatments / Results  Labs (all labs ordered are listed, but only abnormal results are displayed) Labs Reviewed  BASIC METABOLIC PANEL - Abnormal; Notable for the following components:      Result Value   Glucose, Bld 137  (*)    Calcium 8.8 (*)    All other components within normal limits  CBC - Abnormal; Notable for the following components:   RBC 4.00 (*)    Hemoglobin 12.1 (*)    HCT 38.2 (*)    All other components within normal limits  BRAIN NATRIURETIC PEPTIDE - Abnormal; Notable for the following components:   B Natriuretic Peptide 112.0 (*)    All other components within normal limits  PROTIME-INR - Abnormal; Notable for the following components:   Prothrombin Time 20.7 (*)    INR 1.8 (*)    All other  components within normal limits  APTT - Abnormal; Notable for the following components:   aPTT 44 (*)    All other components within normal limits  TROPONIN I (HIGH SENSITIVITY) - Abnormal; Notable for the following components:   Troponin I (High Sensitivity) 67 (*)    All other components within normal limits  TROPONIN I (HIGH SENSITIVITY) - Abnormal; Notable for the following components:   Troponin I (High Sensitivity) 82 (*)    All other components within normal limits  SARS CORONAVIRUS 2 (HOSPITAL ORDER, [facility])  MAGNESIUM    EKG EKG Interpretation  Date/Time:  Tuesday September 12 2018 08:54:22 EDT Ventricular Rate:  92 PR Interval:    QRS Duration: 133 QT Interval:  401 QTC Calculation: 497 R Axis:   48 Text Interpretation:  Sinus rhythm Left bundle branch block No acute changes No significant change since last tracing Confirmed by Varney Biles [phone]) on 09/12/2018 9:28:32 AM   Radiology Dg Chest Port 1 View  Result Date: 09/12/2018 CLINICAL DATA:  Chest pain, BILATERAL arm numbness since 0200 hours this morning, cough productive of yellow material for months EXAM: PORTABLE CHEST 1 VIEW COMPARISON:  Portable exam 0920 hours compared to 08/20/2016 FINDINGS: Normal heart size, mediastinal contours, and pulmonary vascularity. Atherosclerotic calcification aorta. Accentuation of bibasilar interstitial changes, similar to prior exam. Mild central  peribronchial thickening. No infiltrate, pleural effusion or pneumothorax. Bones unremarkable. IMPRESSION: Mild bronchitic changes and chronic basilar interstitial prominence. No acute abnormalities. Electronically Signed   By: Lavonia Dana M.D.   On: 09/12/2018 09:53    Procedures Procedures (including critical care time)  Medications Ordered in ED Medications  nitroGLYCERIN (NITROSTAT) SL tablet 0.4 mg (has no administration in time range)     Initial Impression / Assessment and Plan / ED Course  I have reviewed the triage vital signs and the nursing notes.  Pertinent labs & imaging results that were available during my care of the patient were reviewed by me and considered in my medical decision making (see chart for details).  Clinical Course as of Sep 12 1239  Tue Sep 12, 2018  1240 I discussed the case with Dr. Harl Bowie, cardiology. He is recommending that given that patient is having anginal-like symptoms with no RVR in the ED, given his high HEART score and the elevated high-sensitivity troponins, we should admit the patient and they will see the patient.   [AN]    Clinical Course User Index [AN] Varney Biles, MD    HEAR Score: 5 CHA2DS2/VAS Stroke Risk Points  Current as of 14 minutes ago     6 >= 2 Points: High Risk  1 - 1.99 Points: Medium Risk  0 Points: Low Risk    This is the only CHA2DS2/VAS Stroke Risk Points available for the past  year.: Last Change: N/A     Details    This score determines the patient's risk of having a stroke if the  patient has atrial fibrillation.       Points Metrics  1 Has Congestive Heart Failure:  Yes    Current as of 14 minutes ago  1 Has Vascular Disease:  Yes    Current as of 14 minutes ago  1 Has Hypertension:  Yes    Current as of 14 minutes ago  2 Age:  50    Current as of 14 minutes ago  1 Has Diabetes:  Yes    Current as of 14 minutes ago  0  Had Stroke:  No  Had TIA:  No  Had thromboembolism:  No    Current as of  14 minutes ago  0 Male:  No    Current as of 14 minutes ago      85 year old male comes in a chief complaint of chest pain.  His chest pain sounds to be ischemic, but he has had similar pain in the past with A. fib.  EMS reported that patient's heart rate was in the 140s when they arrived.  He was given a 300 cc bolus and he is feeling better from the point of view of chest pain and his heart rate is improved as well.  No recent illnesses.  Patient has no known history of coronary artery disease.  He is hypertensive and over the age of 73.  Initial impression is that the chest pain is unlikely because of ACS, and that the pain was likely because of demand ischemia from RVR.  At the moment patient is having persistent discomfort but he is not in A. fib with RVR, therefore we will get delta troponin while monitoring him closely.  History is not suggestive of any recent illnesses or medication noncompliance.    Final Clinical Impressions(s) / ED Diagnoses   Final diagnoses:  Precordial chest pain  Angina pectoris [facility])  Atrial fibrillation, unspecified type [facility])    ED Discharge Orders    None       Varney Biles, MD 09/12/18 1241

## 2018-09-12 NOTE — ED Triage Notes (Signed)
Pt brought in by [facility] from home with c/o chest pain, bilateral arm numbness since 0200 this morning. EMS reports pt was somewhat altered and pale on scene but was able to answer most questions. Pt was found to be in A. Fib with RVR with HR between 136-181bpm. EMS reports BP 127/95 and then upon standing dropped to 94/55. CBG 136. Pt's HR decreased into the 90's after fluid administration. Pt reports productive yellow cough for months. Pt also reports SOB but reports it is his normal.

## 2018-09-13 DIAGNOSIS — R072 Precordial pain: Secondary | ICD-10-CM | POA: Diagnosis not present

## 2018-09-13 DIAGNOSIS — I4891 Unspecified atrial fibrillation: Secondary | ICD-10-CM | POA: Diagnosis not present

## 2018-09-13 DIAGNOSIS — I480 Paroxysmal atrial fibrillation: Secondary | ICD-10-CM | POA: Diagnosis not present

## 2018-09-13 DIAGNOSIS — R0789 Other chest pain: Secondary | ICD-10-CM | POA: Diagnosis not present

## 2018-09-13 LAB — LIPID PANEL
Cholesterol: 187 mg/dL (ref 0–200)
HDL: 29 mg/dL — ABNORMAL LOW (ref >40)
LDL Cholesterol: 130 mg/dL — ABNORMAL HIGH (ref 0–99)
Total CHOL/HDL Ratio: 6.4 RATIO
Triglycerides: 139 mg/dL (ref <150)
VLDL: 28 mg/dL (ref 0–40)

## 2018-09-13 MED ORDER — METOPROLOL TARTRATE 25 MG PO TABS
37.5000 mg | ORAL_TABLET | Freq: Two times a day (BID) | ORAL | Status: DC
  Administered 2018-09-13: 37.5 mg via ORAL
  Filled 2018-09-13: qty 2

## 2018-09-13 MED ORDER — PANTOPRAZOLE SODIUM 40 MG PO TBEC: 40.0000 mg | DELAYED_RELEASE_TABLET | Freq: Every day | ORAL | 0 refills | Status: DC

## 2018-09-13 MED ORDER — METOPROLOL TARTRATE 37.5 MG PO TABS: 37.5000 mg | ORAL_TABLET | Freq: Two times a day (BID) | ORAL | 0 refills | Status: DC

## 2018-09-13 NOTE — Discharge Summary (Signed)
Jesse Murphy, is a 85 y.o. male  DOB 1928-09-24  MRN [id number].  Admission date:  09/12/2018  Admitting Physician  Barton Dubois, MD  Discharge Date:  09/13/2018   Primary MD  Sandi Mealy, MD  Recommendations for primary care physician for things to follow:  -To follow with cardiology as an outpatient   Admission Diagnosis  Precordial chest pain [R07.2] Angina pectoris ([place]) [I20.9] Atrial fibrillation, unspecified type [facility]) [I48.91]   Discharge Diagnosis  Precordial chest pain [R07.2] Angina pectoris ([place]) [I20.9] Atrial fibrillation, unspecified type ([place]) [I48.91]    Active Problems:   Chest pain      Past Medical History:  Diagnosis Date  . Atrial fibrillation ([place])   . Cancer [facility])    prostate  . Dyspnea   . Hypertension     Past Surgical History:  Procedure Laterality Date  . COLONOSCOPY N/A 08/25/2017   Procedure: COLONOSCOPY;  Surgeon: Rogene Houston, MD;  Location: [facility] ENDO SUITE;  Service: Endoscopy;  Laterality: N/A;  . HERNIA REPAIR    . PROSTATE SURGERY         History of present illness and  Hospital Course:     Kindly see H&P for history of present illness and admission details, please review complete Labs, Consult reports and Test reports for all details in brief  HPI  from the history and physical done on the day of admission 7/28  HPI: Jesse Murphy is a 85 y.o. male with a past medical history significant for atrial fibrillation (chronically on Xarelto), hypertension, history of prostate cancer, BPH, chronic diastolic dysfunction, prior hx of CAD, prediabetes and asthma/allergy rhinitis; who presented to the emergency department secondary to chest discomfort.  Patient reports experiencing chest discomfort in the middle of his chest, slightly radiated to the left side, 7 out of 10 in intensity, chest pressure in nature, with mild associated dizziness and  shortness of breath.  Patient reports some palpitations.  No nausea, no vomiting.  He was returning back to bed from using the bathroom in the morning.  Patient reports that he waited a few hours and even the pain subsided some in intensity never went away.  Around breakfast time he notices that the discomfort severity once again increased at the moment he decided to come to the emergency department for further evaluation and management.  Patient contacted EMS and on their arrival they found in her A. fib with RVR, he was noted to be slightly pale and received  fluid resuscitation.  Patient denies any fever, chills, sick contact, nausea, vomiting, abdominal pain, dysuria, hematuria, melena, hematochezia, headaches, focal weakness or any other complaints.  Which was fluid resuscitation his heart rate significantly improved and at time of evaluation he was still having some atrial fibrillation but was no longer in RVR.  Chest x-ray is negative for acute cardiopulmonary process.  Troponin x2 mildly elevated, no acute ischemic changes on EKG appreciated.  Cardiology was consulted and [facility] asked to bring patient as observation for further evaluation  and management of chest pain.  Hospital Course    chest pain: -Cardiology input greatly appreciated, patient had mild elevated high-sensitivity troponins, with mild delta, currently trending back down, this was felt secondary to severe tachycardia in the setting of chronic obstructive CAD, cardiology do not suspect acute obstructive CAD, plan is for better heart rate control, please see discussion below under A. Fib, -No ischemic work-up is planned during hospital stay.  paroxysmal atrial fibrillation -Metoprolol has been increased to 37.5 mg p.o. twice daily. -Continue Xarelto  BPH -No complaints of urinary retention -Continue Flomax  history of COPD/asthma -Reports breathing at baseline -Currently no wheezing. -Continue the use of Singulair,  Claritin and Flonase  essential hypertension -Continue metoprolol and low-dose Lasix  hyperglycemia/pre-diabetes -Remote history of prediabetes with A1c of 6.3 in 2018 -Currently not taking medications for blood sugar control  chronic diastolic heart failure -Appears to be compensated coronary   Discharge Condition: stable   Follow UP  Follow-up Information    Erma Heritage, PA-C Follow up on 10/03/2018.   Specialties: Physician Assistant, Cardiology Why: [place] on 10/03/2018 at 3:30 with Bernerd Pho, PA-C (Works with Dr. Bronson Ing) Contact information: [place] [address] [phone]             Discharge Instructions  and  Discharge Medications     Discharge Instructions    Discharge instructions   Complete by: As directed    Follow with Primary MD Sandi Mealy, MD in 7 days   Get CBC, CMP,  checked  by Primary MD next visit.    Activity: As tolerated with Full fall precautions use walker/cane & assistance as needed   Disposition Home    Diet: Heart Healthy , with feeding assistance and aspiration precautions.  For Heart failure patients - Check your Weight same time everyday, if you gain over 2 pounds, or you develop in leg swelling, experience more shortness of breath or chest pain, call your Primary MD immediately. Follow Cardiac Low Salt Diet and 1.5 lit/day fluid restriction.   On your next visit with your primary care physician please Get Medicines reviewed and adjusted.   Please request your Prim.MD to go over all Hospital Tests and Procedure/Radiological results at the follow up, please get all Hospital records sent to your Prim MD by signing hospital release before you go home.   If you experience worsening of your admission symptoms, develop shortness of breath, life threatening emergency, suicidal or homicidal thoughts you must seek medical attention immediately by calling 911 or  calling your MD immediately  if symptoms less severe.  You Must read complete instructions/literature along with all the possible adverse reactions/side effects for all the Medicines you take and that have been prescribed to you. Take any new Medicines after you have completely understood and accpet all the possible adverse reactions/side effects.   Do not drive, operating heavy machinery, perform activities at heights, swimming or participation in water activities or provide baby sitting services if your were admitted for syncope or siezures until you have seen by Primary MD or a Neurologist and advised to do so again.  Do not drive when taking Pain medications.    Do not take more than prescribed Pain, Sleep and Anxiety Medications  Special Instructions: If you have smoked or chewed Tobacco  in the last 2 yrs please stop smoking, stop any regular Alcohol  and or any Recreational drug use.  Wear Seat belts while driving.  Please note  You were cared for by a hospitalist during your hospital stay. If you have any questions about your discharge medications or the care you received while you were in the hospital after you are discharged, you can call the unit and asked to speak with the hospitalist on call if the hospitalist that took care of you is not available. Once you are discharged, your primary care physician will handle any further medical issues. Please note that NO REFILLS for any discharge medications will be authorized once you are discharged, as it is imperative that you return to your primary care physician (or establish a relationship with a primary care physician if you do not have one) for your aftercare needs so that they can reassess your need for medications and monitor your lab values.   Increase activity slowly   Complete by: As directed      Allergies as of 09/13/2018   No Known Allergies     Medication List    TAKE these medications   acetaminophen 325 MG tablet  Commonly known as: TYLENOL Take 325 mg by mouth every 8 (eight) hours as needed for moderate pain.   Flintstones Plus Iron chewable tablet Chew 1 tablet by mouth 2 (two) times daily.   fluticasone 50 MCG/ACT nasal spray Commonly known as: FLONASE daily as needed.   furosemide 20 MG tablet Commonly known as: LASIX Take 20 mg by mouth daily.   loratadine 10 MG tablet Commonly known as: CLARITIN Take 10 mg by mouth daily.   Metoprolol Tartrate 37.5 MG Tabs Take 37.5 mg by mouth 2 (two) times daily. What changed:   medication strength  how much to take   montelukast 10 MG tablet Commonly known as: SINGULAIR Take 1 tablet by mouth at bedtime.   pantoprazole 40 MG tablet Commonly known as: PROTONIX Take 1 tablet (40 mg total) by mouth daily. Start taking on: September 14, 2018   rivaroxaban 20 MG Tabs tablet Commonly known as: XARELTO Take 1 tablet (20 mg total) by mouth daily with supper.   tamsulosin 0.4 MG Caps capsule Commonly known as: FLOMAX Take 0.4 mg by mouth 2 (two) times daily.         Diet and Activity recommendation: See Discharge Instructions above   Consults obtained -  Cardiology   Major procedures and Radiology Reports - PLEASE review detailed and final reports for all details, in brief -      Dg Chest Port 1 View  Result Date: 09/12/2018 CLINICAL DATA:  Chest pain, BILATERAL arm numbness since 0200 hours this morning, cough productive of yellow material for months EXAM: PORTABLE CHEST 1 VIEW COMPARISON:  Portable exam 0920 hours compared to 08/20/2016 FINDINGS: Normal heart size, mediastinal contours, and pulmonary vascularity. Atherosclerotic calcification aorta. Accentuation of bibasilar interstitial changes, similar to prior exam. Mild central peribronchial thickening. No infiltrate, pleural effusion or pneumothorax. Bones unremarkable. IMPRESSION: Mild bronchitic changes and chronic basilar interstitial prominence. No acute abnormalities.  Electronically Signed   By: Lavonia Dana M.D.   On: 09/12/2018 09:53    Micro Results     Recent Results (from the past 240 hour(s))  SARS Coronavirus 2 (CEPHEID - Performed in [place] hospital lab), Hosp Order     Status: None   Collection Time: 09/12/18  9:08 AM   Specimen: Nasopharyngeal Swab  Result Value Ref Range Status   SARS Coronavirus 2 NEGATIVE NEGATIVE Final    Comment: (NOTE) If result is NEGATIVE SARS-CoV-2 target nucleic acids  are NOT DETECTED. The SARS-CoV-2 RNA is generally detectable in upper and lower  respiratory specimens during the acute phase of infection. The lowest  concentration of SARS-CoV-2 viral copies this assay can detect is 250  copies / mL. A negative result does not preclude SARS-CoV-2 infection  and should not be used as the sole basis for treatment or other  patient management decisions.  A negative result may occur with  improper specimen collection / handling, submission of specimen other  than nasopharyngeal swab, presence of viral mutation(s) within the  areas targeted by this assay, and inadequate number of viral copies  (<250 copies / mL). A negative result must be combined with clinical  observations, patient history, and epidemiological information. If result is POSITIVE SARS-CoV-2 target nucleic acids are DETECTED. The SARS-CoV-2 RNA is generally detectable in upper and lower  respiratory specimens dur ing the acute phase of infection.  Positive  results are indicative of active infection with SARS-CoV-2.  Clinical  correlation with patient history and other diagnostic information is  necessary to determine patient infection status.  Positive results do  not rule out bacterial infection or co-infection with other viruses. If result is PRESUMPTIVE POSTIVE SARS-CoV-2 nucleic acids MAY BE PRESENT.   A presumptive positive result was obtained on the submitted specimen  and confirmed on repeat testing.  While 2019 novel coronavirus   (SARS-CoV-2) nucleic acids may be present in the submitted sample  additional confirmatory testing may be necessary for epidemiological  and / or clinical management purposes  to differentiate between  SARS-CoV-2 and other Sarbecovirus currently known to infect humans.  If clinically indicated additional testing with an alternate test  methodology [phone]) is advised. The SARS-CoV-2 RNA is generally  detectable in upper and lower respiratory sp ecimens during the acute  phase of infection. The expected result is Negative. Fact Sheet for Patients:  StrictlyIdeas.no Fact Sheet for Healthcare Providers: BankingDealers.co.za This test is not yet approved or cleared by the [place] FDA and has been authorized for detection and/or diagnosis of SARS-CoV-2 by FDA under an Emergency Use Authorization (EUA).  This EUA will remain in effect (meaning this test can be used) for the duration of the COVID-19 declaration under Section 564(b)(1) of the Act, 21 U.S.C. section 360bbb-3(b)(1), unless the authorization is terminated or revoked sooner. Performed at [facility], [address], [place], [place] [address]        Today   Subjective:   Kaedan Richert today has no headache,no chest abdominal pain,no new weakness tingling or numbness, feels much better wants to go home today.   Objective:   Blood pressure (!) 122/59, pulse 73, temperature 98.6 F (37 C), temperature source Oral, resp. rate 16, height 5\' 9"  (1.753 m), weight 91 kg, SpO2 92 %.   Intake/Output Summary (Last 24 hours) at 09/13/2018 1251 Last data filed at 09/13/2018 1221 Gross per 24 hour  Intake 720 ml  Output -  Net 720 ml    Exam Awake Alert, Oriented x 3, No new F.N deficits, Normal affect [place].AT,PERRAL Supple Neck,No JVD, No cervical lymphadenopathy appriciated.  Symmetrical Chest wall movement, Good air movement bilaterally, CTAB IRR IRR,No Gallops,Rubs or new  Murmurs, No Parasternal Heave +ve B.Sounds, Abd Soft, Non tender, No organomegaly appriciated, No rebound -guarding or rigidity. No Cyanosis, Clubbing or edema, No new Rash or bruise  Data Review   CBC w Diff:  Lab Results  Component Value Date   WBC 6.0 09/12/2018   HGB 12.1 (L)  09/12/2018   HCT 38.2 (L) 09/12/2018   PLT 195 09/12/2018   LYMPHOPCT 19 06/01/2018   MONOPCT 14 06/01/2018   EOSPCT 4 06/01/2018   BASOPCT 1 06/01/2018    CMP:  Lab Results  Component Value Date   NA 138 09/12/2018   K 4.4 09/12/2018   CL 101 09/12/2018   CO2 28 09/12/2018   BUN 18 09/12/2018   CREATININE 1.02 09/12/2018   PROT 7.7 06/01/2018   ALBUMIN 4.2 06/01/2018   BILITOT 0.7 06/01/2018   ALKPHOS 56 06/01/2018   AST 20 06/01/2018   ALT 15 06/01/2018  .   Total Time in preparing paper work, data evaluation and todays exam - 68 minutes  Phillips Climes M.D on 09/13/2018 at 12:51 PM  [facility]   Office  [phone]

## 2018-09-13 NOTE — Discharge Instructions (Signed)
Follow with Primary MD Sandi Mealy, MD in 7 days   Get CBC, CMP,  checked  by Primary MD next visit.    Activity: As tolerated with Full fall precautions use walker/cane & assistance as needed   Disposition Home    Diet: Heart Healthy , with feeding assistance and aspiration precautions.  For Heart failure patients - Check your Weight same time everyday, if you gain over 2 pounds, or you develop in leg swelling, experience more shortness of breath or chest pain, call your Primary MD immediately. Follow Cardiac Low Salt Diet and 1.5 lit/day fluid restriction.   On your next visit with your primary care physician please Get Medicines reviewed and adjusted.   Please request your Prim.MD to go over all Hospital Tests and Procedure/Radiological results at the follow up, please get all Hospital records sent to your Prim MD by signing hospital release before you go home.   If you experience worsening of your admission symptoms, develop shortness of breath, life threatening emergency, suicidal or homicidal thoughts you must seek medical attention immediately by calling 911 or calling your MD immediately  if symptoms less severe.  You Must read complete instructions/literature along with all the possible adverse reactions/side effects for all the Medicines you take and that have been prescribed to you. Take any new Medicines after you have completely understood and accpet all the possible adverse reactions/side effects.   Do not drive, operating heavy machinery, perform activities at heights, swimming or participation in water activities or provide baby sitting services if your were admitted for syncope or siezures until you have seen by Primary MD or a Neurologist and advised to do so again.  Do not drive when taking Pain medications.    Do not take more than prescribed Pain, Sleep and Anxiety Medications  Special Instructions: If you have smoked or chewed Tobacco  in the last 2 yrs  please stop smoking, stop any regular Alcohol  and or any Recreational drug use.  Wear Seat belts while driving.   Please note  You were cared for by a hospitalist during your hospital stay. If you have any questions about your discharge medications or the care you received while you were in the hospital after you are discharged, you can call the unit and asked to speak with the hospitalist on call if the hospitalist that took care of you is not available. Once you are discharged, your primary care physician will handle any further medical issues. Please note that NO REFILLS for any discharge medications will be authorized once you are discharged, as it is imperative that you return to your primary care physician (or establish a relationship with a primary care physician if you do not have one) for your aftercare needs so that they can reassess your need for medications and monitor your lab values.

## 2018-09-13 NOTE — Progress Notes (Signed)
Progress Note  Patient Name: Jesse Murphy Date of Encounter: 09/13/2018  Primary Cardiologist: Kate Sable, MD  Subjective   No complaints  Inpatient Medications    Scheduled Meds: . furosemide  20 mg Oral Daily  . loratadine  10 mg Oral Daily  . metoprolol tartrate  25 mg Oral BID  . montelukast  10 mg Oral QHS  . pantoprazole  40 mg Oral Daily  . rivaroxaban  20 mg Oral Q supper  . tamsulosin  0.4 mg Oral BID   Continuous Infusions:  PRN Meds: acetaminophen, fluticasone, nitroGLYCERIN, ondansetron (ZOFRAN) IV   Vital Signs    Vitals:   09/12/18 1932 09/12/18 2124 09/13/18 0500 09/13/18 0523  BP:  (!) 101/53  (!) 122/59  Pulse:  77  73  Resp:  20  16  Temp:  98.3 F (36.8 C)  98.6 F (37 C)  TempSrc:  Oral  Oral  SpO2: 92% 91%  92%  Weight:   91 kg   Height:        Intake/Output Summary (Last 24 hours) at 09/13/2018 0948 Last data filed at 09/13/2018 0837 Gross per 24 hour  Intake 480 ml  Output -  Net 480 ml   Last 3 Weights 09/13/2018 09/12/2018 09/12/2018  Weight (lbs) 200 lb 9.9 oz 199 lb 15.3 oz 204 lb  Weight (kg) 91 kg 90.7 kg 92.534 kg      Telemetry    NSR- Personally Reviewed  ECG    n/a - Personally Reviewed  Physical Exam   GEN: No acute distress.   Neck: No JVD Cardiac: RRR, no murmurs, rubs, or gallops.  Respiratory: Clear to auscultation bilaterally. GI: Soft, nontender, non-distended  MS: No edema; No deformity. Neuro:  Nonfocal  Psych: Normal affect   Labs    High Sensitivity Troponin:   Recent Labs  Lab 09/12/18 0936 09/12/18 1124 09/12/18 1521 09/12/18 1944 09/12/18 2252  TROPONINIHS 67* 82* 82* 78* 70*      Cardiac EnzymesNo results for input(s): TROPONINI in the last 168 hours. No results for input(s): TROPIPOC in the last 168 hours.   Chemistry Recent Labs  Lab 09/12/18 0936  NA 138  K 4.4  CL 101  CO2 28  GLUCOSE 137*  BUN 18  CREATININE 1.02  CALCIUM 8.8*  GFRNONAA >60  GFRAA >60   ANIONGAP 9     Hematology Recent Labs  Lab 09/12/18 0936  WBC 6.0  RBC 4.00*  HGB 12.1*  HCT 38.2*  MCV 95.5  MCH 30.3  MCHC 31.7  RDW 14.0  PLT 195    BNP Recent Labs  Lab 09/12/18 0942  BNP 112.0*     DDimer No results for input(s): DDIMER in the last 168 hours.   Radiology    Dg Chest Port 1 View  Result Date: 09/12/2018 CLINICAL DATA:  Chest pain, BILATERAL arm numbness since 0200 hours this morning, cough productive of yellow material for months EXAM: PORTABLE CHEST 1 VIEW COMPARISON:  Portable exam 0920 hours compared to 08/20/2016 FINDINGS: Normal heart size, mediastinal contours, and pulmonary vascularity. Atherosclerotic calcification aorta. Accentuation of bibasilar interstitial changes, similar to prior exam. Mild central peribronchial thickening. No infiltrate, pleural effusion or pneumothorax. Bones unremarkable. IMPRESSION: Mild bronchitic changes and chronic basilar interstitial prominence. No acute abnormalities. Electronically Signed   By: Lavonia Dana M.D.   On: 09/12/2018 09:53    Cardiac Studies    Patient Profile     Jesse Murphy is a 85  y.o. male with a hx of PAF and CAD who is being seen today for the evaluation of chest pain at the request of Dr Dyann Kief  Assessment & Plan    1. Chest pain/History of CAD - admitted with several hours of constant though varying in severity chest pressure, SOB, dizziness, palpitations.  - in afib with RVR at initial EMS evaluation and also some runs in the ER, this may have been to origin alone of his symptoms or perhaps in combiation with his chronic obstructive CAD. Symptoms more suggestive of symptomatic arrhythmia as opposed to ACS Mild elevated hstrop with mild delta trending back down. I suspect due to severe tachycardia in setting of his chronic obstructive CAD, do no suspect acute obstructive CAD.  - no ischemic workup is planned   2. PAF - episodes of afib with RVR by EMS and in ER - lopressor  increased to 37.5mg  bid.  - continue xarelto, calcualted CrCl is 67, 20mg  daily is the appropriate dose.     Pound for discharge today. We will arrange followup.       For questions or updates, please contact [place] Please consult [email or web address] for contact info under        Signed, Carlyle Dolly, MD  09/13/2018, 9:48 AM

## 2018-09-13 NOTE — Care Management Obs Status (Signed)
[place] NOTIFICATION   Patient Details  Name: Jesse Murphy MRN: [id number] Date of Birth: 11/25/1928   Medicare Observation Status Notification Given:  Yes    Ihor Gully, LCSW 09/13/2018, 11:27 AM

## 2018-09-13 NOTE — Plan of Care (Signed)

## 2018-10-02 ENCOUNTER — Inpatient Hospital Stay (HOSPITAL_COMMUNITY): Payer: Medicare Other | Attending: Hematology

## 2018-10-02 ENCOUNTER — Other Ambulatory Visit: Payer: Self-pay

## 2018-10-02 DIAGNOSIS — D509 Iron deficiency anemia, unspecified: Secondary | ICD-10-CM | POA: Insufficient documentation

## 2018-10-02 DIAGNOSIS — Z79899 Other long term (current) drug therapy: Secondary | ICD-10-CM | POA: Insufficient documentation

## 2018-10-02 DIAGNOSIS — R600 Localized edema: Secondary | ICD-10-CM | POA: Diagnosis not present

## 2018-10-02 DIAGNOSIS — K625 Hemorrhage of anus and rectum: Secondary | ICD-10-CM | POA: Insufficient documentation

## 2018-10-02 DIAGNOSIS — I10 Essential (primary) hypertension: Secondary | ICD-10-CM | POA: Diagnosis not present

## 2018-10-02 DIAGNOSIS — C8511 Unspecified B-cell lymphoma, lymph nodes of head, face, and neck: Secondary | ICD-10-CM | POA: Insufficient documentation

## 2018-10-02 DIAGNOSIS — R5383 Other fatigue: Secondary | ICD-10-CM | POA: Diagnosis not present

## 2018-10-02 DIAGNOSIS — Z87891 Personal history of nicotine dependence: Secondary | ICD-10-CM | POA: Diagnosis not present

## 2018-10-02 DIAGNOSIS — I2510 Atherosclerotic heart disease of native coronary artery without angina pectoris: Secondary | ICD-10-CM | POA: Insufficient documentation

## 2018-10-02 DIAGNOSIS — C8590 Non-Hodgkin lymphoma, unspecified, unspecified site: Secondary | ICD-10-CM

## 2018-10-02 DIAGNOSIS — Z923 Personal history of irradiation: Secondary | ICD-10-CM | POA: Insufficient documentation

## 2018-10-02 DIAGNOSIS — R0609 Other forms of dyspnea: Secondary | ICD-10-CM | POA: Insufficient documentation

## 2018-10-02 DIAGNOSIS — Z8546 Personal history of malignant neoplasm of prostate: Secondary | ICD-10-CM | POA: Insufficient documentation

## 2018-10-02 LAB — COMPREHENSIVE METABOLIC PANEL
ALT: 12 U/L (ref 0–44)
AST: 19 U/L (ref 15–41)
Albumin: 3.8 g/dL (ref 3.5–5.0)
Alkaline Phosphatase: 46 U/L (ref 38–126)
Anion gap: 9 (ref 5–15)
BUN: 13 mg/dL (ref 8–23)
CO2: 27 mmol/L (ref 22–32)
Calcium: 8.8 mg/dL — ABNORMAL LOW (ref 8.9–10.3)
Chloride: 101 mmol/L (ref 98–111)
Creatinine, Ser: 0.9 mg/dL (ref 0.61–1.24)
GFR calc Af Amer: >60 mL/min (ref >60)
GFR calc non Af Amer: >60 mL/min (ref >60)
Glucose, Bld: 132 mg/dL — ABNORMAL HIGH (ref 70–99)
Potassium: 4.2 mmol/L (ref 3.5–5.1)
Sodium: 137 mmol/L (ref 135–145)
Total Bilirubin: 0.5 mg/dL (ref 0.3–1.2)
Total Protein: 7.1 g/dL (ref 6.5–8.1)

## 2018-10-02 LAB — CBC WITH DIFFERENTIAL/PLATELET
Abs Immature Granulocytes: 0.02 10*3/uL (ref 0.00–0.07)
Basophils Absolute: 0 10*3/uL (ref 0.0–0.1)
Basophils Relative: 0 %
Eosinophils Absolute: 0.2 10*3/uL (ref 0.0–0.5)
Eosinophils Relative: 3 %
HCT: 34.9 % — ABNORMAL LOW (ref 39.0–52.0)
Hemoglobin: 11 g/dL — ABNORMAL LOW (ref 13.0–17.0)
Immature Granulocytes: 0 %
Lymphocytes Relative: 13 %
Lymphs Abs: 0.9 10*3/uL (ref 0.7–4.0)
MCH: 29.7 pg (ref 26.0–34.0)
MCHC: 31.5 g/dL (ref 30.0–36.0)
MCV: 94.3 fL (ref 80.0–100.0)
Monocytes Absolute: 0.6 10*3/uL (ref 0.1–1.0)
Monocytes Relative: 8 %
Neutro Abs: 5 10*3/uL (ref 1.7–7.7)
Neutrophils Relative %: 76 %
Platelets: 211 10*3/uL (ref 150–400)
RBC: 3.7 MIL/uL — ABNORMAL LOW (ref 4.22–5.81)
RDW: 13.8 % (ref 11.5–15.5)
WBC: 6.7 10*3/uL (ref 4.0–10.5)
nRBC: 0 % (ref 0.0–0.2)

## 2018-10-02 LAB — IRON AND TIBC
Iron: 61 ug/dL (ref 45–182)
Saturation Ratios: 15 % — ABNORMAL LOW (ref 17.9–39.5)
TIBC: 407 ug/dL (ref 250–450)
UIBC: 346 ug/dL

## 2018-10-02 LAB — FERRITIN: Ferritin: 40 ng/mL (ref 24–336)

## 2018-10-02 LAB — LACTATE DEHYDROGENASE: LDH: 122 U/L (ref 98–192)

## 2018-10-03 ENCOUNTER — Encounter: Payer: Self-pay | Admitting: Student

## 2018-10-03 ENCOUNTER — Ambulatory Visit (INDEPENDENT_AMBULATORY_CARE_PROVIDER_SITE_OTHER): Payer: Medicare Other | Admitting: Student

## 2018-10-03 ENCOUNTER — Other Ambulatory Visit: Payer: Self-pay

## 2018-10-03 VITALS — BP 154/84 | HR 84 | Temp 97.5°F | Ht 69.0 in | Wt 206.0 lb

## 2018-10-03 DIAGNOSIS — I10 Essential (primary) hypertension: Secondary | ICD-10-CM | POA: Diagnosis not present

## 2018-10-03 DIAGNOSIS — Z9861 Coronary angioplasty status: Secondary | ICD-10-CM

## 2018-10-03 DIAGNOSIS — E785 Hyperlipidemia, unspecified: Secondary | ICD-10-CM

## 2018-10-03 DIAGNOSIS — I2510 Atherosclerotic heart disease of native coronary artery without angina pectoris: Secondary | ICD-10-CM

## 2018-10-03 DIAGNOSIS — I480 Paroxysmal atrial fibrillation: Secondary | ICD-10-CM | POA: Diagnosis not present

## 2018-10-03 DIAGNOSIS — I5032 Chronic diastolic (congestive) heart failure: Secondary | ICD-10-CM | POA: Diagnosis not present

## 2018-10-03 MED ORDER — METOPROLOL TARTRATE 50 MG PO TABS: 50.0000 mg | ORAL_TABLET | Freq: Two times a day (BID) | ORAL | 3 refills | Status: DC

## 2018-10-03 NOTE — Patient Instructions (Signed)
Medication Instructions:  Your physician has recommended you make the following change in your medication:  Lopressor 50 mg Two Times Daily   If you need a refill on your cardiac medications before your next appointment, please call your pharmacy.   Lab work: NONE   If you have labs (blood work) drawn today and your tests are completely normal, you will receive your results only by: Marland Kitchen MyChart Message (if you have MyChart) OR . A paper copy in the mail If you have any lab test that is abnormal or we need to change your treatment, we will call you to review the results.  Testing/Procedures: NONE   Follow-Up: At [facility], you and your health needs are our priority.  As part of our continuing mission to provide you with exceptional heart care, we have created designated Provider Care Teams.  These Care Teams include your primary Cardiologist (physician) and Advanced Practice Providers (APPs -  Physician Assistants and Nurse Practitioners) who all work together to provide you with the care you need, when you need it. You will need a follow up appointment in 2-3  months.  Please call our office 2 months in advance to schedule this appointment.  You may see Kate Sable, MD or one of the following Advanced Practice Providers on your designated Care Team:   Bernerd Pho, PA-C [facility]) . Ermalinda Barrios, PA-C ([place])  Any Other Special Instructions Will Be Listed Below (If Applicable). Thank you for choosing [facility]!

## 2018-10-03 NOTE — Progress Notes (Signed)
Cardiology Office Note    Date:  10/03/2018   ID:  Jesse, Murphy 1928-12-28, MRN [id number]  PCP:  Sandi Mealy, MD  Cardiologist: Kate Sable, MD    Chief Complaint  Patient presents with  . Hospitalization Follow-up    History of Present Illness:    Jesse Murphy is a 85 y.o. male with past medical history of CAD (s/p PCI in 1992, Coronary CT in 01/2018 showing extensive coronary calcification), chronic diastolic CHF, PAF, HTN, HLD, follicular lymphoma and prostate cancer (s/p radiation in 2013) who presents to the office today for hospital follow-up.   He was most recently admitted to [facility] on 09/12/2018 for evaluation of chest pain, shortness of breath, and dizziness. He reported that symptoms had started around 0100 and lasted for several minutes. While fixing breakfast, he had a similar discomfort and came to the ED for evaluation. His pain was present for over 8 hours. Was found to have episodes of atrial fibrillation with RVR while in the ED and Lopressor was increased from 25mg  BID to 37.5mg  BID during his hospitalization. HS Troponin values were flat, peaking at 82. This was thought to be most consistent with demand ischemia in the setting of his atrial fibrillation and not consistent with ACS. He denied any recurrent symptoms following dose titration of Lopressor and further testing was not pursued.   In talking with the patient today, he denies any recurrent episodes of chest discomfort resembling his recent admission but has continued to experience intermittent palpitations and dyspnea on exertion. His palpitations typically occur on a daily basis and can last for a few seconds to several minutes, typically occurring with activity. He reports feeling short of breath at times with minimal activity such as making the bed.  Denies any specific orthopnea, PND, or lower extremity edema. Weight has overall been stable on his home scales at 206 lbs.    Past Medical  History:  Diagnosis Date  . Atrial fibrillation ([place])   . CAD (coronary artery disease)    a. s/p PCI in 1992 b. Coronary CT in 01/2018 showing extensive coronary calcification; FFR indeterminate --> medical management pursued at that time as patient not interested in repeat cath  . Cancer [facility])    prostate  . Dyspnea   . Hypertension     Past Surgical History:  Procedure Laterality Date  . COLONOSCOPY N/A 08/25/2017   Procedure: COLONOSCOPY;  Surgeon: Rogene Houston, MD;  Location: [facility] ENDO SUITE;  Service: Endoscopy;  Laterality: N/A;  . HERNIA REPAIR    . PROSTATE SURGERY      Current Medications: Outpatient Medications Prior to Visit  Medication Sig Dispense Refill  . acetaminophen (TYLENOL) 325 MG tablet Take 325 mg by mouth every 8 (eight) hours as needed for moderate pain.     Marland Kitchen atorvastatin (LIPITOR) 10 MG tablet Take 10 mg by mouth.     . fluticasone (FLONASE) 50 MCG/ACT nasal spray daily as needed.     . furosemide (LASIX) 20 MG tablet Take 20 mg by mouth daily.     Marland Kitchen loratadine (CLARITIN) 10 MG tablet Take 10 mg by mouth daily.    . pantoprazole (PROTONIX) 40 MG tablet Take 1 tablet (40 mg total) by mouth daily. 30 tablet 0  . Pediatric Multivitamins-Iron (FLINTSTONES PLUS IRON) chewable tablet Chew 1 tablet by mouth 2 (two) times daily.    . rivaroxaban (XARELTO) 20 MG TABS tablet Take 1 tablet (20 mg total) by  mouth daily with supper.    . tamsulosin (FLOMAX) 0.4 MG CAPS capsule Take 0.4 mg by mouth 2 (two) times daily.     . metoprolol tartrate 37.5 MG TABS Take 37.5 mg by mouth 2 (two) times daily. 60 tablet 0  . montelukast (SINGULAIR) 10 MG tablet Take 1 tablet by mouth at bedtime.     No facility-administered medications prior to visit.      Allergies:   Patient has no known allergies.   Social History   Socioeconomic History  . Marital status: Married    Spouse name: Not on file  . Number of children: 2  . Years of education: Not on file  . Highest  education level: Not on file  Occupational History  . 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 Financial resource strain: Not hard at all  . Food insecurity    Worry: Never true    Inability: Never true  . Transportation needs    Medical: No    Non-medical: No  Tobacco Use  . Smoking status: Former Smoker    Packs/day: 0.25    Years: 50.00    Pack years: 12.50    Types: Cigarettes    Quit date: 02/15/1990    Years since quitting: 28.6  . Smokeless tobacco: Never Used  Substance and Sexual Activity  . Alcohol use: No  . Drug use: No  . Sexual activity: Not Currently  Lifestyle  . Physical activity    Days per week: 0 days    Minutes per session: 0 min  . Stress: Not at all  Relationships  . Social Herbalist on phone: Once a week    Gets together: More than three times a week    Attends religious service: 1 to 4 times per year    Active member of club or organization: No    Attends meetings of clubs or organizations: Never    Relationship status: Married  Other Topics Concern  . Not on file  Social History Narrative  . Not on file     Family History:  The patient's family history includes CAD in his brother and sister; CAD (age of onset: 63) in his mother; CAD (age of onset: 43) in his father.   Review of Systems:   Please see the history of present illness.     General:  No chills, fever, night sweats or weight changes.  Cardiovascular:  No chest pain,  edema, orthopnea, paroxysmal nocturnal dyspnea. Positive for palpitations and dyspnea on exertion.  Dermatological: No rash, lesions/masses Respiratory: No cough, dyspnea Urologic: No hematuria, dysuria Abdominal:   No nausea, vomiting, diarrhea, bright red blood per rectum, melena, or hematemesis Neurologic:  No visual changes, wkns, changes in mental status. All other systems reviewed and are otherwise negative except as noted above.   Physical Exam:    VS:  BP (!)  154/84   Pulse 84   Temp (!) 97.5 F (36.4 C)   Ht 5\' 9"  (1.753 m)   Wt 206 lb (93.4 kg)   BMI 30.42 kg/m    General: Well developed, well nourished,male appearing in no acute distress. Head: Normocephalic, atraumatic, sclera non-icteric, no xanthomas, nares are without discharge.  Neck: No carotid bruits. JVD not elevated.  Lungs: Respirations regular and unlabored, without wheezes or rales.  Heart: Regular rate and rhythm. No S3 or S4.  No murmur, no rubs, or gallops appreciated. Abdomen: Soft, non-tender,  non-distended with normoactive bowel sounds. No hepatomegaly. No rebound/guarding. No obvious abdominal masses. Msk:  Strength and tone appear normal for age. No joint deformities or effusions. Extremities: No clubbing or cyanosis. Trace ankle edema bilaterally.  Distal pedal pulses are 2+ bilaterally. Neuro: Alert and oriented X 3. Moves all extremities spontaneously. No focal deficits noted. Psych:  Responds to questions appropriately with a normal affect. Skin: No rashes or lesions noted  Wt Readings from Last 3 Encounters:  10/03/18 206 lb (93.4 kg)  09/13/18 200 lb 9.9 oz (91 kg)  06/08/18 202 lb (91.6 kg)     Studies/Labs Reviewed:   EKG:  EKG is not ordered today.   Recent Labs: 09/12/2018: B Natriuretic Peptide 112.0; Magnesium 2.0; TSH 2.338 10/02/2018: ALT 12; BUN 13; Creatinine, Ser 0.90; Hemoglobin 11.0; Platelets 211; Potassium 4.2; Sodium 137   Lipid Panel    Component Value Date/Time   CHOL 187 09/13/2018 0604   TRIG 139 09/13/2018 0604   HDL 29 (L) 09/13/2018 0604   CHOLHDL 6.4 09/13/2018 0604   VLDL 28 09/13/2018 0604   LDLCALC 130 (H) 09/13/2018 0604    Additional studies/ records that were reviewed today include:   Echocardiogram: 12/2017 Study Conclusions  - Left ventricle: The cavity size was normal. Wall thickness was   normal. Systolic function was normal. The estimated ejection   fraction was in the range of 55% to 60%. Although no  diagnostic   regional wall motion abnormality was identified, this possibility   cannot be completely excluded on the basis of this study. Doppler   parameters are consistent with abnormal left ventricular   relaxation (grade 1 diastolic dysfunction). - Aortic valve: Mildly calcified annulus. Trileaflet; mildly   calcified leaflets. There was no significant regurgitation. - Mitral valve: Mildly to moderately calcified annulus. There was   trivial regurgitation. - Right atrium: Central venous pressure (est): 3 mm Hg. - Tricuspid valve: There was trivial regurgitation. - Pulmonary arteries: Systolic pressure could not be accurately   estimated. - Pericardium, extracardiac: A prominent pericardial fat pad was   present.  Coronary CT: 01/2018 Coronary Arteries: Right dominant with no anomalies  LM: Calcified plaque distal left main, mild (<50%) stenosis.  LAD system: Extensive mixed plaque. Suspect no more than 50% stenosis in the proximal LAD. Possible severe (70-90%) mid LAD stenosis.  Circumflex system: Small ramus, possible severe (70-90%) ostial stenosis with calcified plaque. Calcified plaque ostial LCx, mild stenosis. Large high OM1, calcified plaque proximally with mild stenosis. Relatively small AV LCx with minimal disease.  RCA system: Extensive mixed plaque throughout the RCA. Suspect 50% proximal RCA stenosis. Possible severe (70-90%) mid RCA stenosis and moderate (51-69%) distal RCA stenosis.  IMPRESSION: 1. Coronary artery calcium score 1169 Agatston units. This places the patient in the 67th percentile for his age group and gender, suggesting intermediate risk for future cardiac events.  2. Extensive coronary calcification (common in patients > 80) make interpretation of this study difficult due to blooming artifact.  3. Possible severe stenosis in the mid RCA and mid LAD, as well as in the ostial ramus (small vessel). Will send for FFR to try to confirm.   FINDINGS: This study was not adequate for FFR (misregistration artifact) so analysis could not be done.   Assessment:    1. PAF (paroxysmal atrial fibrillation) ([place])   2. Coronary artery disease involving native coronary artery of native heart without angina pectoris   3. Chronic diastolic heart failure ([place])   4. Essential hypertension  5. Hyperlipidemia LDL goal <70      Plan:   In order of problems listed above:  1. Paroxysmal Atrial Fibrillation - recently admitted for symptoms and Lopressor was titrated from 25mg  BID to 37.5mg  BID. Still reporting episodes of palpitations on a daily basis but appears to be in NSR by examination today. Will titrate Lopressor to 50mg  BID. I encouraged him to follow HR and BP at home given the medication changes.  - he denies any evidence of active bleeding. Continue Xarelto for anticoagulation.   2. CAD - he is s/p PCI in 1992 with Coronary CT in 01/2018 showing extensive coronary calcification as outlined above. He reports worsening dyspnea on exertion for the past several months but denies any recurrent chest pain resembling what he was experiencing at the time of his recent admission. Reviewed with the patient that if symptoms do not improve with dose titration of Lopressor, then may need to consider a repeat cardiac catheterization given his recent Coronary CT results. He is not interested in a cardiac catheterization at this time unless absolutely necessary. Would address at follow-up if symptoms have not improved. I encouraged him to make [place] aware of any progression of his symptoms in the interim.  - continue BB and statin therapy. Not on ASA given the need for anticoagulation.   3. Chronic Diastolic CHF - weight has been stable on his home scales and he denies any recent orthopnea, PND, or edema. Continue with Lasix 20mg  daily. Creatinine was stable at 0.90 during recent admission.   4. HTN - BP elevated at 154/84 during today's visit.  Will follow with dose titration of Lopressor.  5. HLD - followed by PCP. LDL was elevated at 130 by most recent labs and he was recently restarted on Atorvastatin 10mg  daily. Previously intolerant to Crestor.  Medication Adjustments/Labs and Tests Ordered: Current medicines are reviewed at length with the patient today.  Concerns regarding medicines are outlined above.  Medication changes, Labs and Tests ordered today are listed in the Patient Instructions below. Patient Instructions  Medication Instructions:  Your physician has recommended you make the following change in your medication:  Lopressor 50 mg Two Times Daily   If you need a refill on your cardiac medications before your next appointment, please call your pharmacy.   Lab work: NONE   If you have labs (blood work) drawn today and your tests are completely normal, you will receive your results only by: Marland Kitchen MyChart Message (if you have MyChart) OR . A paper copy in the mail If you have any lab test that is abnormal or we need to change your treatment, we will call you to review the results.  Testing/Procedures: NONE   Follow-Up: At [facility], you and your health needs are our priority.  As part of our continuing mission to provide you with exceptional heart care, we have created designated Provider Care Teams.  These Care Teams include your primary Cardiologist (physician) and Advanced Practice Providers (APPs -  Physician Assistants and Nurse Practitioners) who all work together to provide you with the care you need, when you need it. You will need a follow up appointment in 2-3  months.  Please call our office 2 months in advance to schedule this appointment.  You may see Kate Sable, MD or one of the following Advanced Practice Providers on your designated Care Team:   Bernerd Pho, PA-C [facility]) . Ermalinda Barrios, PA-C ([place])  Any Other Special Instructions Will Be  Listed Below (If  Applicable). Thank you for choosing [facility]!     Signed, Erma Heritage, PA-C  10/03/2018 7:54 PM    [place] [place], [place] [address] Phone: [phone] Fax: [phone]

## 2018-10-09 ENCOUNTER — Other Ambulatory Visit: Payer: Self-pay

## 2018-10-09 ENCOUNTER — Inpatient Hospital Stay (HOSPITAL_BASED_OUTPATIENT_CLINIC_OR_DEPARTMENT_OTHER): Payer: Medicare Other | Admitting: Hematology

## 2018-10-09 ENCOUNTER — Encounter (HOSPITAL_COMMUNITY): Payer: Self-pay | Admitting: Hematology

## 2018-10-09 VITALS — BP 127/57 | HR 75 | Temp 97.7°F | Resp 20 | Wt 208.6 lb

## 2018-10-09 DIAGNOSIS — C8511 Unspecified B-cell lymphoma, lymph nodes of head, face, and neck: Secondary | ICD-10-CM | POA: Diagnosis not present

## 2018-10-09 DIAGNOSIS — C8590 Non-Hodgkin lymphoma, unspecified, unspecified site: Secondary | ICD-10-CM | POA: Diagnosis not present

## 2018-10-09 NOTE — Progress Notes (Signed)
[facility] [address], [place] [address]   CLINIC:  Medical Oncology/Hematology  PCP:  Sandi Mealy, MD [place] [phone]   REASON FOR VISIT:  Follow-up for B-cell lymphoma of the scalp    INTERVAL HISTORY:  Jesse Murphy 85 y.o. male seen for follow-up of his lymphoma and iron deficiency state.  He does report occasional bleeding per rectum.  Shortness of breath on exertion is stable.  Appetite is 100%.  Energy levels are 50%.  Denies any fevers, night sweats or weight loss in the last 6 months.  Denies any chest pains or lightheadedness.  Ankle swellings have been stable.  Denies any ER visits or hospitalizations.    REVIEW OF SYSTEMS:  Review of Systems  Respiratory: Positive for shortness of breath.   Cardiovascular: Positive for leg swelling.  All other systems reviewed and are negative.    PAST MEDICAL/SURGICAL HISTORY:  Past Medical History:  Diagnosis Date  . Atrial fibrillation ([place])   . CAD (coronary artery disease)    a. s/p PCI in 1992 b. Coronary CT in 01/2018 showing extensive coronary calcification; FFR indeterminate --> medical management pursued at that time as patient not interested in repeat cath  . Cancer [facility])    prostate  . Dyspnea   . Hypertension    Past Surgical History:  Procedure Laterality Date  . COLONOSCOPY N/A 08/25/2017   Procedure: COLONOSCOPY;  Surgeon: Rogene Houston, MD;  Location: [facility]O [facility];  Service: Endoscopy;  Laterality: N/A;  . HERNIA REPAIR    . PROSTATE SURGERY       SOCIAL HISTORY:  Social History   Socioeconomic History  . Marital status: Married    Spouse name: Not on file  . Number of children: 2  . Years of education: Not on file  . Highest education level: Not on file  Occupational History  . 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 Financial resource strain: Not hard at all  . Food insecurity    Worry: Never  true    Inability: Never true  . Transportation needs    Medical: No    Non-medical: No  Tobacco Use  . Smoking status: Former Smoker    Packs/day: 0.25    Years: 50.00    Pack years: 12.50    Types: Cigarettes    Quit date: 02/15/1990    Years since quitting: 28.6  . Smokeless tobacco: Never Used  Substance and Sexual Activity  . Alcohol use: No  . Drug use: No  . Sexual activity: Not Currently  Lifestyle  . Physical activity    Days per week: 0 days    Minutes per session: 0 min  . Stress: Not at all  Relationships  . Social Herbalist on phone: Once a week    Gets together: More than three times a week    Attends religious service: 1 to 4 times per year    Active member of club or organization: No    Attends meetings of clubs or organizations: Never    Relationship status: Married  . Intimate partner violence    Fear of current or ex partner: No    Emotionally abused: No    Physically abused: No    Forced sexual activity: No  Other Topics Concern  . Not on file  Social History Narrative  . Not on file  FAMILY HISTORY:  Family History  Problem Relation Age of Onset  . CAD Mother 105  . CAD Father 20  . CAD Sister   . CAD Brother     CURRENT MEDICATIONS:  Outpatient Encounter Medications as of 10/09/2018  Medication Sig  . acetaminophen (TYLENOL) 325 MG tablet Take 325 mg by mouth every 8 (eight) hours as needed for moderate pain.   Marland Kitchen atorvastatin (LIPITOR) 10 MG tablet Take 10 mg by mouth.   . fluticasone (FLONASE) 50 MCG/ACT nasal spray daily as needed.   . furosemide (LASIX) 20 MG tablet Take 20 mg by mouth daily.   Marland Kitchen loratadine (CLARITIN) 10 MG tablet Take 10 mg by mouth daily.  . metoprolol tartrate (LOPRESSOR) 50 MG tablet Take 1 tablet (50 mg total) by mouth 2 (two) times daily.  . pantoprazole (PROTONIX) 40 MG tablet Take 1 tablet (40 mg total) by mouth daily.  . Pediatric Multivitamins-Iron (FLINTSTONES PLUS IRON) chewable tablet Chew 1  tablet by mouth 2 (two) times daily.  . rivaroxaban (XARELTO) 20 MG TABS tablet Take 1 tablet (20 mg total) by mouth daily with supper.  . tamsulosin (FLOMAX) 0.4 MG CAPS capsule Take 0.4 mg by mouth 2 (two) times daily.    No facility-administered encounter medications on file as of 10/09/2018.     ALLERGIES:  No Known Allergies   PHYSICAL EXAM:  ECOG Performance status: 1  Vitals:   10/09/18 1043  BP: (!) 127/57  Pulse: 75  Resp: 20  Temp: 97.7 F (36.5 C)  SpO2: 97%   Filed Weights   10/09/18 1043  Weight: 208 lb 9.6 oz (94.6 kg)    Physical Exam Vitals signs reviewed.  Constitutional:      Appearance: Normal appearance.  Cardiovascular:     Rate and Rhythm: Normal rate and regular rhythm.     Heart sounds: Normal heart sounds.  Pulmonary:     Effort: Pulmonary effort is normal.     Breath sounds: Normal breath sounds.  Abdominal:     General: There is no distension.     Palpations: Abdomen is soft. There is no mass.  Musculoskeletal:        General: No swelling.  Lymphadenopathy:     Cervical: No cervical adenopathy.  Skin:    General: Skin is warm.  Neurological:     General: No focal deficit present.     Mental Status: He is alert and oriented to person, place, and time.  Psychiatric:        Mood and Affect: Mood normal.        Behavior: Behavior normal.      LABORATORY DATA:  I have reviewed the labs as listed.  CBC    Component Value Date/Time   WBC 6.7 10/02/2018 1040   RBC 3.70 (L) 10/02/2018 1040   HGB 11.0 (L) 10/02/2018 1040   HCT 34.9 (L) 10/02/2018 1040   PLT 211 10/02/2018 1040   MCV 94.3 10/02/2018 1040   MCH 29.7 10/02/2018 1040   MCHC 31.5 10/02/2018 1040   RDW 13.8 10/02/2018 1040   LYMPHSABS 0.9 10/02/2018 1040   MONOABS 0.6 10/02/2018 1040   EOSABS 0.2 10/02/2018 1040   BASOSABS 0.0 10/02/2018 1040   CMP Latest Ref Rng & Units 10/02/2018 09/12/2018 06/01/2018  Glucose 70 - 99 mg/dL 132(H) 137(H) 128(H)  BUN 8 - 23 mg/dL  13 18 18   Creatinine 0.61 - 1.24 mg/dL 0.90 1.02 0.97  Sodium 135 - 145 mmol/L 137 138 136  Potassium 3.5 - 5.1 mmol/L 4.2 4.4 4.2  Chloride 98 - 111 mmol/L 101 101 100  CO2 22 - 32 mmol/L 27 28 27   Calcium 8.9 - 10.3 mg/dL 8.8(L) 8.8(L) 9.0  Total Protein 6.5 - 8.1 g/dL 7.1 - 7.7  Total Bilirubin 0.3 - 1.2 mg/dL 0.5 - 0.7  Alkaline Phos 38 - 126 U/L 46 - 56  AST 15 - 41 U/L 19 - 20  ALT 0 - 44 U/L 12 - 15       DIAGNOSTIC IMAGING:  I have independently reviewed the scans and discussed with the patient.   I have reviewed Venita Lick LPN's note and agree with the documentation.  I personally performed a face-to-face visit, made revisions and my assessment and plan is as follows.    ASSESSMENT & PLAN:   Non Hodgkin's lymphoma ([place]) 1.  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 Nevada Crane, biopsy on 09/22/2017 showed atypical B-cell infiltrate concerning for B-cell lymphoma, possibly is follicular type.  CD20 was strongly positive. - He did not have any B symptoms.  Physical exam did not show any adenopathy. -PET scan on 10/31/2017 showed left frontal scalp area hypermetabolic with SUV of 3.3.  High density of liver was seen.  No hypermetabolic activity suggesting lymphoma. - XRT to the left scalp by Dr.Yanagihara from 12/08/2017 through 12/23/2017. - Today examination did not reveal any suspicious lesions.  No palpable adenopathy or splenomegaly.  No B symptoms. -We reviewed blood work.  We will continue 63-month follow-up visits.  2.  Iron deficiency anemia: - His hemoglobin dropped by 1 g to 11.  Ferritin also decreased to 40.  He is complaining of feeling tired. - I have recommended Feraheme weekly x2. -He does report occasional bleeding per rectum.  Does not give any history of hemorrhoids.  I have recommended GI follow-up.  He does not want GI follow-up at this time. -I will reevaluate him in 4 months with repeat  labs.  3.  Prostate cancer: -Diagnosed 3 to 4 years ago, status post radiation therapy at [facility].  Total time spent is 25 minutes with more than 50% of the time spent face-to-face discussing treatment plan, counseling and coordination of care.    Orders placed this encounter:  Orders Placed This Encounter  Procedures  . CBC with Differential/Platelet  . Comprehensive metabolic panel  . Iron and TIBC  . Ferritin  . Vitamin B12  . Folate  . Lactate dehydrogenase      Derek Jack, MD [place] [phone]

## 2018-10-09 NOTE — Patient Instructions (Addendum)
[facility] Discharge Instructions  You were seen today by Dr. Katragadda. He went over your recent lab results. He will see you back in  for labs and follow up.   Thank you for choosing  Can[facility] Center at [facility] to provide your oncology and hematology care.  To afford each patient quality time with our provider, please arrive at least 15 minutes before your scheduled appointment time.   If you have a lab appointment with the Cancer Center please come in thru the  Main Entrance and check in at the main information desk  You need to re-schedule your appointment should you arrive 10 or more minutes late.  We strive to give you quality time with our providers, and arriving late affects you and other patients whose appointments are after yours.  Also, if you no show three or more times for appointments you may be dismissed from the clinic at the providers discretion.     Again, thank you for choosing [facility].  Our hope is that these requests will decrease the amount of time that you wait before being seen by our physicians.       _____________________________________________________________  Should you have questions after your visit to [facility], please contact our office at [phone] between the hours of 8:00 a.m. and 4:30 p.m.  Voicemails left after 4:00 p.m. will not be returned until the following business day.  For prescription refill requests, have your pharmacy contact our office and allow 72 hours.    Cancer Center Support Programs:   > Cancer Support Group  2nd Tuesday of the month 1pm-2pm, Journey Room    

## 2018-10-09 NOTE — Assessment & Plan Note (Signed)
1.  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 Nevada Crane, biopsy on 09/22/2017 showed atypical B-cell infiltrate concerning for B-cell lymphoma, possibly is follicular type.  CD20 was strongly positive. - He did not have any B symptoms.  Physical exam did not show any adenopathy. -PET scan on 10/31/2017 showed left frontal scalp area hypermetabolic with SUV of 3.3.  High density of liver was seen.  No hypermetabolic activity suggesting lymphoma. - XRT to the left scalp by Dr.Yanagihara from 12/08/2017 through 12/23/2017. - Today examination did not reveal any suspicious lesions.  No palpable adenopathy or splenomegaly.  No B symptoms. -We reviewed blood work.  We will continue 17-month follow-up visits.  2.  Iron deficiency anemia: - His hemoglobin dropped by 1 g to 11.  Ferritin also decreased to 40.  He is complaining of feeling tired. - I have recommended Feraheme weekly x2. -He does report occasional bleeding per rectum.  Does not give any history of hemorrhoids.  I have recommended GI follow-up.  He does not want GI follow-up at this time. -I will reevaluate him in 4 months with repeat labs.  3.  Prostate cancer: -Diagnosed 3 to 4 years ago, status post radiation therapy at [facility].

## 2018-10-20 ENCOUNTER — Other Ambulatory Visit: Payer: Self-pay

## 2018-10-20 ENCOUNTER — Inpatient Hospital Stay (HOSPITAL_COMMUNITY): Payer: Medicare Other | Attending: Hematology

## 2018-10-20 VITALS — BP 118/54 | HR 66 | Temp 97.8°F | Resp 18

## 2018-10-20 DIAGNOSIS — R195 Other fecal abnormalities: Secondary | ICD-10-CM

## 2018-10-20 DIAGNOSIS — D509 Iron deficiency anemia, unspecified: Secondary | ICD-10-CM | POA: Insufficient documentation

## 2018-10-20 DIAGNOSIS — C8511 Unspecified B-cell lymphoma, lymph nodes of head, face, and neck: Secondary | ICD-10-CM | POA: Insufficient documentation

## 2018-10-20 MED ORDER — SODIUM CHLORIDE 0.9 % IV SOLN
510.0000 mg | Freq: Once | INTRAVENOUS | Status: AC
  Administered 2018-10-20: 510 mg via INTRAVENOUS
  Filled 2018-10-20: qty 510

## 2018-10-20 MED ORDER — SODIUM CHLORIDE 0.9 % IV SOLN
INTRAVENOUS | Status: DC
  Administered 2018-10-20: 13:00:00 via INTRAVENOUS

## 2018-10-20 NOTE — Progress Notes (Signed)
Feraheme given per orders. Patient tolerated it well without problems. Vitals stable and discharged home from clinic ambulatory. Follow up as scheduled.  

## 2018-10-20 NOTE — Patient Instructions (Signed)
[facility] at [facility]  Discharge Instructions:   _______________________________________________________________  Thank you for choosing [facility] at [facility] to provide your oncology and hematology care.  To afford each patient quality time with our providers, please arrive at least 15 minutes before your scheduled appointment.  You need to re-schedule your appointment if you arrive 10 or more minutes late.  We strive to give you quality time with our providers, and arriving late affects you and other patients whose appointments are after yours.  Also, if you no show three or more times for appointments you may be dismissed from the clinic.  Again, thank you for choosing [facility]. Our hope is that these requests will allow you access to exceptional care and in a timely manner. _______________________________________________________________  If you have questions after your visit, please contact our office at [phone] between the hours of 8:30 a.m. and 5:00 p.m. Voicemails left after 4:30 p.m. will not be returned until the following business day. _______________________________________________________________  For prescription refill requests, have your pharmacy contact our office. _______________________________________________________________  Recommendations made by the consultant and any test results will be sent to your referring physician. _______________________________________________________________ 

## 2018-10-27 ENCOUNTER — Inpatient Hospital Stay (HOSPITAL_COMMUNITY): Payer: Medicare Other

## 2018-10-27 ENCOUNTER — Other Ambulatory Visit: Payer: Self-pay

## 2018-10-27 VITALS — BP 154/67 | HR 68 | Temp 97.6°F | Resp 18

## 2018-10-27 DIAGNOSIS — C8511 Unspecified B-cell lymphoma, lymph nodes of head, face, and neck: Secondary | ICD-10-CM | POA: Diagnosis not present

## 2018-10-27 DIAGNOSIS — R195 Other fecal abnormalities: Secondary | ICD-10-CM

## 2018-10-27 MED ORDER — SODIUM CHLORIDE 0.9 % IV SOLN
INTRAVENOUS | Status: DC
  Administered 2018-10-27: 13:00:00 via INTRAVENOUS

## 2018-10-27 MED ORDER — SODIUM CHLORIDE 0.9 % IV SOLN
510.0000 mg | Freq: Once | INTRAVENOUS | Status: AC
  Administered 2018-10-27: 510 mg via INTRAVENOUS
  Filled 2018-10-27: qty 510

## 2018-10-27 NOTE — Progress Notes (Signed)
Feraheme given per orders. Patient tolerated it well without problems. Vitals stable and discharged home from clinic ambulatory. Follow up as scheduled.  

## 2019-01-01 ENCOUNTER — Ambulatory Visit (INDEPENDENT_AMBULATORY_CARE_PROVIDER_SITE_OTHER): Payer: Medicare Other | Admitting: Cardiovascular Disease

## 2019-01-01 ENCOUNTER — Encounter: Payer: Self-pay | Admitting: Cardiovascular Disease

## 2019-01-01 VITALS — BP 141/62 | HR 87 | Ht 69.0 in | Wt 203.0 lb

## 2019-01-01 DIAGNOSIS — I5032 Chronic diastolic (congestive) heart failure: Secondary | ICD-10-CM | POA: Diagnosis not present

## 2019-01-01 DIAGNOSIS — E785 Hyperlipidemia, unspecified: Secondary | ICD-10-CM

## 2019-01-01 DIAGNOSIS — I480 Paroxysmal atrial fibrillation: Secondary | ICD-10-CM

## 2019-01-01 DIAGNOSIS — I25118 Atherosclerotic heart disease of native coronary artery with other forms of angina pectoris: Secondary | ICD-10-CM | POA: Diagnosis not present

## 2019-01-01 DIAGNOSIS — I2510 Atherosclerotic heart disease of native coronary artery without angina pectoris: Secondary | ICD-10-CM | POA: Diagnosis not present

## 2019-01-01 DIAGNOSIS — I110 Hypertensive heart disease with heart failure: Secondary | ICD-10-CM

## 2019-01-01 DIAGNOSIS — Z9861 Coronary angioplasty status: Secondary | ICD-10-CM | POA: Diagnosis not present

## 2019-01-01 DIAGNOSIS — I10 Essential (primary) hypertension: Secondary | ICD-10-CM

## 2019-01-01 NOTE — Patient Instructions (Signed)
Medication Instructions:  Your physician recommends that you continue on your current medications as directed. Please refer to the Current Medication list given to you today.  *If you need a refill on your cardiac medications before your next appointment, please call your pharmacy*  Lab Work: None today If you have labs (blood work) drawn today and your tests are completely normal, you will receive your results only by: Marland Kitchen MyChart Message (if you have MyChart) OR . A paper copy in the mail If you have any lab test that is abnormal or we need to change your treatment, we will call you to review the results.  Testing/Procedures: None today  Follow-Up: At [facility], you and your health needs are our priority.  As part of our continuing mission to provide you with exceptional heart care, we have created designated Provider Care Teams.  These Care Teams include your primary Cardiologist (physician) and Advanced Practice Providers (APPs -  Physician Assistants and Nurse Practitioners) who all work together to provide you with the care you need, when you need it.  Your next appointment:   6 months  The format for your next appointment:   In Person  Provider:   Bernerd Pho, PA-C  Other Instructions None      Thank you for choosing [facility] !

## 2019-01-01 NOTE — Progress Notes (Signed)
Virtual Visit via Telephone Note   This visit type was conducted due to national recommendations for restrictions regarding the COVID-19 Pandemic (e.g. social distancing) in an effort to limit this patient's exposure and mitigate transmission in our community.  Due to his co-morbid illnesses, this patient is at least at moderate risk for complications without adequate follow up.  This format is felt to be most appropriate for this patient at this time.  The patient did not have access to video technology/had technical difficulties with video requiring transitioning to audio format only (telephone).  All issues noted in this document were discussed and addressed.  No physical exam could be performed with this format.  Please refer to the patient's chart for his  consent to telehealth for [facility].   Date:  01/01/2019   ID:  Jesse Murphy, DOB 03-07-1928, MRN [id number]  Patient Location: Home Provider Location: Office  PCP:  Sandi Mealy, MD  Cardiologist:  Kate Sable, MD  Electrophysiologist:  None   Evaluation Performed:  Follow-Up Visit  Chief Complaint:  CAD  History of Present Illness:    Jesse Murphy is a 85 y.o. male with past medical history of CAD (s/p PCI in 1992, Coronary CT in 01/2018 showing extensive coronary calcification), chronic diastolic CHF, PAF, HTN, HLD, follicular lymphoma and prostate cancer (s/p radiation in 2013).  He was watching tv this past weekend and developed b/l arm pain. Symptoms lasted 15-20 mins and resolved spontaneously.  He denies chest pain.   Past Medical History:  Diagnosis Date  . Atrial fibrillation ([place])   . CAD (coronary artery disease)    a. s/p PCI in 1992 b. Coronary CT in 01/2018 showing extensive coronary calcification; FFR indeterminate --> medical management pursued at that time as patient not interested in repeat cath  . Cancer [facility])    prostate  . Dyspnea   . Hypertension    Past Surgical History:   Procedure Laterality Date  . COLONOSCOPY N/A 08/25/2017   Procedure: COLONOSCOPY;  Surgeon: Rogene Houston, MD;  [facility];  Service: Endoscopy;  Laterality: N/A;  . HERNIA REPAIR    . PROSTATE SURGERY       Current Meds  Medication Sig  . acetaminophen (TYLENOL) 325 MG tablet Take 325 mg by mouth every 8 (eight) hours as needed for moderate pain.   Marland Kitchen atorvastatin (LIPITOR) 10 MG tablet Take 10 mg by mouth.   . fluticasone (FLONASE) 50 MCG/ACT nasal spray daily as needed.   . furosemide (LASIX) 20 MG tablet Take 20 mg by mouth daily.   Marland Kitchen loratadine (CLARITIN) 10 MG tablet Take 10 mg by mouth daily.  . metoprolol tartrate (LOPRESSOR) 50 MG tablet Take 1 tablet (50 mg total) by mouth 2 (two) times daily.  . pantoprazole (PROTONIX) 40 MG tablet Take 1 tablet (40 mg total) by mouth daily.  . Pediatric Multivitamins-Iron (FLINTSTONES PLUS IRON) chewable tablet Chew 1 tablet by mouth 2 (two) times daily.  . rivaroxaban (XARELTO) 20 MG TABS tablet Take 1 tablet (20 mg total) by mouth daily with supper.     Allergies:   Patient has no known allergies.   Social History   Tobacco Use  . Smoking status: Former Smoker    Packs/day: 0.25    Years: 50.00    Pack years: 12.50    Types: Cigarettes    Quit date: 02/15/1990    Years since quitting: 28.8  . Smokeless tobacco: Never Used  Substance  Use Topics  . Alcohol use: No  . Drug use: No     Family Hx: The patient's family history includes CAD in his brother and sister; CAD (age of onset: 72) in his mother; CAD (age of onset: 42) in his father.  ROS:   Please see the history of present illness.     All other systems reviewed and are negative.   Prior CV studies:   The following studies were reviewed today:  Echocardiogram: 12/2017 Study Conclusions  - Left ventricle: The cavity size was normal. Wall thickness was normal. Systolic function was normal. The estimated ejection fraction was in the range of 55%  to 60%. Although no diagnostic regional wall motion abnormality was identified, this possibility cannot be completely excluded on the basis of this study. Doppler parameters are consistent with abnormal left ventricular relaxation (grade 1 diastolic dysfunction). - Aortic valve: Mildly calcified annulus. Trileaflet; mildly calcified leaflets. There was no significant regurgitation. - Mitral valve: Mildly to moderately calcified annulus. There was trivial regurgitation. - Right atrium: Central venous pressure (est): 3 mm Hg. - Tricuspid valve: There was trivial regurgitation. - Pulmonary arteries: Systolic pressure could not be accurately estimated. - Pericardium, extracardiac: A prominent pericardial fat pad was present.  Coronary CT: 01/2018 Coronary Arteries: Right dominant with no anomalies  LM: Calcified plaque distal left main, mild (<50%) stenosis.  LAD system: Extensive mixed plaque. Suspect no more than 50% stenosis in the proximal LAD. Possible severe (70-90%) mid LAD stenosis.  Circumflex system: Small ramus, possible severe (70-90%) ostial stenosis with calcified plaque. Calcified plaque ostial LCx, mild stenosis. Large high OM1, calcified plaque proximally with mild stenosis. Relatively small AV LCx with minimal disease.  RCA system: Extensive mixed plaque throughout the RCA. Suspect 50% proximal RCA stenosis. Possible severe (70-90%) mid RCA stenosis and moderate (51-69%) distal RCA stenosis.  IMPRESSION: 1. Coronary artery calcium score 1169 Agatston units. This places the patient in the 67th percentile for his age group and gender, suggesting intermediate risk for future cardiac events.  2. Extensive coronary calcification (common in patients > 80) make interpretation of this study difficult due to blooming artifact.  3. Possible severe stenosis in the mid RCA and mid LAD, as well as in the ostial ramus (small vessel). Will send for  FFR to try to confirm.  FINDINGS: This study was not adequate for FFR (misregistration artifact) so analysis could not be done.  Labs/Other Tests and Data Reviewed:    EKG:  No ECG reviewed.  Recent Labs: 09/12/2018: B Natriuretic Peptide 112.0; Magnesium 2.0; TSH 2.338 10/02/2018: ALT 12; BUN 13; Creatinine, Ser 0.90; Hemoglobin 11.0; Platelets 211; Potassium 4.2; Sodium 137   Recent Lipid Panel Lab Results  Component Value Date/Time   CHOL 187 09/13/2018 06:04 AM   TRIG 139 09/13/2018 06:04 AM   HDL 29 (L) 09/13/2018 06:04 AM   CHOLHDL 6.4 09/13/2018 06:04 AM   LDLCALC 130 (H) 09/13/2018 06:04 AM    Wt Readings from Last 3 Encounters:  01/01/19 203 lb (92.1 kg)  10/09/18 208 lb 9.6 oz (94.6 kg)  10/03/18 206 lb (93.4 kg)     Objective:    Vital Signs:  BP (!) 141/62   Pulse 87   Ht 5\' 9"  (1.753 m)   Wt 203 lb (92.1 kg)   BMI 29.98 kg/m    VITAL SIGNS:  reviewed  ASSESSMENT & PLAN:    1. PAF: Continue metoprolol and Xarelto.  2. CAD: Denies anginal symptoms. Continue metoprolol and  statin.  3. Chronic diastolic HF: Continue Lasix.  4. HTN: Mildly elevated BP. No changes.  5. Hyperlipidemia: Continue atorvastatin.   COVID-19 Education: The signs and symptoms of COVID-19 were discussed with the patient and how to seek care for testing (follow up with PCP or arrange E-visit).  The importance of social distancing was discussed today.  Time:   Today, I have spent 5 minutes with the patient with telehealth technology discussing the above problems.     Medication Adjustments/Labs and Tests Ordered: Current medicines are reviewed at length with the patient today.  Concerns regarding medicines are outlined above.   Tests Ordered: No orders of the defined types were placed in this encounter.   Medication Changes: No orders of the defined types were placed in this encounter.   Follow Up:  Virtual Visit  in 6 month(s)  Signed, Kate Sable, MD   01/01/2019 11:56 AM    [place]

## 2019-01-30 ENCOUNTER — Inpatient Hospital Stay (HOSPITAL_COMMUNITY): Payer: Medicare Other | Attending: Hematology

## 2019-01-30 ENCOUNTER — Other Ambulatory Visit: Payer: Self-pay

## 2019-01-30 DIAGNOSIS — I10 Essential (primary) hypertension: Secondary | ICD-10-CM | POA: Insufficient documentation

## 2019-01-30 DIAGNOSIS — C8331 Diffuse large B-cell lymphoma, lymph nodes of head, face, and neck: Secondary | ICD-10-CM | POA: Diagnosis not present

## 2019-01-30 DIAGNOSIS — I2510 Atherosclerotic heart disease of native coronary artery without angina pectoris: Secondary | ICD-10-CM | POA: Diagnosis not present

## 2019-01-30 DIAGNOSIS — C8590 Non-Hodgkin lymphoma, unspecified, unspecified site: Secondary | ICD-10-CM

## 2019-01-30 DIAGNOSIS — Z923 Personal history of irradiation: Secondary | ICD-10-CM | POA: Diagnosis not present

## 2019-01-30 DIAGNOSIS — Z87891 Personal history of nicotine dependence: Secondary | ICD-10-CM | POA: Diagnosis not present

## 2019-01-30 DIAGNOSIS — Z8546 Personal history of malignant neoplasm of prostate: Secondary | ICD-10-CM | POA: Insufficient documentation

## 2019-01-30 DIAGNOSIS — Z79899 Other long term (current) drug therapy: Secondary | ICD-10-CM | POA: Insufficient documentation

## 2019-01-30 DIAGNOSIS — C8511 Unspecified B-cell lymphoma, lymph nodes of head, face, and neck: Secondary | ICD-10-CM | POA: Diagnosis present

## 2019-01-30 DIAGNOSIS — I4891 Unspecified atrial fibrillation: Secondary | ICD-10-CM | POA: Insufficient documentation

## 2019-01-30 DIAGNOSIS — D509 Iron deficiency anemia, unspecified: Secondary | ICD-10-CM | POA: Diagnosis not present

## 2019-01-30 LAB — CBC WITH DIFFERENTIAL/PLATELET
Abs Immature Granulocytes: 0.02 10*3/uL (ref 0.00–0.07)
Basophils Absolute: 0 10*3/uL (ref 0.0–0.1)
Basophils Relative: 0 %
Eosinophils Absolute: 0.2 10*3/uL (ref 0.0–0.5)
Eosinophils Relative: 2 %
HCT: 39.8 % (ref 39.0–52.0)
Hemoglobin: 12.3 g/dL — ABNORMAL LOW (ref 13.0–17.0)
Immature Granulocytes: 0 %
Lymphocytes Relative: 14 %
Lymphs Abs: 1.1 10*3/uL (ref 0.7–4.0)
MCH: 30 pg (ref 26.0–34.0)
MCHC: 30.9 g/dL (ref 30.0–36.0)
MCV: 97.1 fL (ref 80.0–100.0)
Monocytes Absolute: 0.7 10*3/uL (ref 0.1–1.0)
Monocytes Relative: 10 %
Neutro Abs: 5.6 10*3/uL (ref 1.7–7.7)
Neutrophils Relative %: 74 %
Platelets: 214 10*3/uL (ref 150–400)
RBC: 4.1 MIL/uL — ABNORMAL LOW (ref 4.22–5.81)
RDW: 13.8 % (ref 11.5–15.5)
WBC: 7.6 10*3/uL (ref 4.0–10.5)
nRBC: 0 % (ref 0.0–0.2)

## 2019-01-30 LAB — COMPREHENSIVE METABOLIC PANEL
ALT: 14 U/L (ref 0–44)
AST: 18 U/L (ref 15–41)
Albumin: 3.8 g/dL (ref 3.5–5.0)
Alkaline Phosphatase: 60 U/L (ref 38–126)
Anion gap: 9 (ref 5–15)
BUN: 21 mg/dL (ref 8–23)
CO2: 28 mmol/L (ref 22–32)
Calcium: 9.1 mg/dL (ref 8.9–10.3)
Chloride: 100 mmol/L (ref 98–111)
Creatinine, Ser: 1.19 mg/dL (ref 0.61–1.24)
GFR calc Af Amer: >60 mL/min (ref >60)
GFR calc non Af Amer: 55 mL/min — ABNORMAL LOW (ref >60)
Glucose, Bld: 132 mg/dL — ABNORMAL HIGH (ref 70–99)
Potassium: 4.6 mmol/L (ref 3.5–5.1)
Sodium: 137 mmol/L (ref 135–145)
Total Bilirubin: 0.5 mg/dL (ref 0.3–1.2)
Total Protein: 7.5 g/dL (ref 6.5–8.1)

## 2019-01-30 LAB — IRON AND TIBC
Iron: 47 ug/dL (ref 45–182)
Saturation Ratios: 11 % — ABNORMAL LOW (ref 17.9–39.5)
TIBC: 428 ug/dL (ref 250–450)
UIBC: 381 ug/dL

## 2019-01-30 LAB — FERRITIN: Ferritin: 35 ng/mL (ref 24–336)

## 2019-01-30 LAB — LACTATE DEHYDROGENASE: LDH: 116 U/L (ref 98–192)

## 2019-01-30 LAB — FOLATE: Folate: 58.5 ng/mL (ref >5.9)

## 2019-01-30 LAB — VITAMIN B12: Vitamin B-12: 556 pg/mL (ref 180–914)

## 2019-02-05 ENCOUNTER — Other Ambulatory Visit: Payer: Self-pay

## 2019-02-06 ENCOUNTER — Inpatient Hospital Stay (HOSPITAL_BASED_OUTPATIENT_CLINIC_OR_DEPARTMENT_OTHER): Payer: Medicare Other | Admitting: Hematology

## 2019-02-06 ENCOUNTER — Encounter (HOSPITAL_COMMUNITY): Payer: Self-pay | Admitting: Hematology

## 2019-02-06 VITALS — BP 141/62 | HR 73 | Temp 97.5°F | Resp 18 | Wt 201.5 lb

## 2019-02-06 DIAGNOSIS — C8590 Non-Hodgkin lymphoma, unspecified, unspecified site: Secondary | ICD-10-CM

## 2019-02-06 DIAGNOSIS — C8331 Diffuse large B-cell lymphoma, lymph nodes of head, face, and neck: Secondary | ICD-10-CM | POA: Diagnosis not present

## 2019-02-06 DIAGNOSIS — D649 Anemia, unspecified: Secondary | ICD-10-CM | POA: Diagnosis not present

## 2019-02-06 NOTE — Patient Instructions (Signed)
[facility] at [facility] Discharge Instructions  You were seen today by Dr. Delton Coombes. He went over your recent lab results. Continue taking the flintstone vitamins twice a day. He will see you back in 4 months for labs and follow up.   Thank you for choosing [place] at [facility] to provide your oncology and hematology care.  To afford each patient quality time with our provider, please arrive at least 15 minutes before your scheduled appointment time.   If you have a lab appointment with the [place] please come in thru the  Main Entrance and check in at the main information desk  You need to re-schedule your appointment should you arrive 10 or more minutes late.  We strive to give you quality time with our providers, and arriving late affects you and other patients whose appointments are after yours.  Also, if you no show three or more times for appointments you may be dismissed from the clinic at the providers discretion.     Again, thank you for choosing [facility].  Our hope is that these requests will decrease the amount of time that you wait before being seen by our physicians.       _____________________________________________________________  Should you have questions after your visit to [facility], please contact our office at [phone] between the hours of 8:00 a.m. and 4:30 p.m.  Voicemails left after 4:00 p.m. will not be returned until the following business day.  For prescription refill requests, have your pharmacy contact our office and allow 72 hours.    Cancer Center Support Programs:   > Cancer Support Group  2nd Tuesday of the month 1pm-2pm, Journey Room

## 2019-02-06 NOTE — Assessment & Plan Note (Signed)
1.  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 Nevada Crane, biopsy on 09/22/2017 showed atypical B-cell infiltrate concerning for B-cell lymphoma, possibly is follicular type.  CD20 was strongly positive. - He did not have any B symptoms.  Physical exam did not show any adenopathy. -PET scan on 10/31/2017 showed left frontal scalp area hypermetabolic with SUV of 3.3.  High density of liver was seen.  No hypermetabolic activity suggesting lymphoma. - XRT to the left scalp by Dr.Yanagihara from 12/08/2017 through 12/23/2017. -Examination today did not reveal any suspicious scalp lesions.  No palpable adenopathy or splenomegaly.  No B symptoms. -LDH was normal.  He will follow-up in 4 months for physical exam.  2.  Iron deficiency anemia: -Received Feraheme on 10/20/2018 and 10/27/2018.  Noticed improvement in energy levels. -Denies any bleeding per rectum or melena. -We reviewed his labs.  Hemoglobin improved to 12.3.  Ferritin is 35.  R93 and folic acid were normal.  Creatinine is mildly elevated at 1.19. -He will continue Flintstone vitamins twice daily.  I plan to recheck his labs in 4 months.  3.  Prostate cancer: -Diagnosed 3 to 4 years ago, status post radiation therapy at [facility].

## 2019-02-06 NOTE — Progress Notes (Signed)
[place] [address]  [address]   CLINIC:  Medical Oncology/Hematology  PCP:  Sandi Mealy, MD [place] [address] [phone]   REASON FOR VISIT:  Follow-up for B-cell lymphoma of the scalp and iron deficiency anemia.    INTERVAL HISTORY:  Jesse Murphy 85 y.o. male seen for follow-up of her lymphoma and iron deficiency anemia.  Denies any bleeding per rectum or melena.  Taking Flintstone multivitamin tablets twice daily without any side effects.  Denies any constipation or diarrhea.  Denies any fevers, night sweats or weight loss.  Denies any new onset pains.  Last Feraheme infusion was on 10/27/2018.  He felt better in terms of energy after it.    REVIEW OF SYSTEMS:  Review of Systems  All other systems reviewed and are negative.    PAST MEDICAL/SURGICAL HISTORY:  Past Medical History:  Diagnosis Date  . Atrial fibrillation ([place])   . CAD (coronary artery disease)    a. s/p PCI in 1992 b. Coronary CT in 01/2018 showing extensive coronary calcification; FFR indeterminate --> medical management pursued at that time as patient not interested in repeat cath  . Cancer [facility])    prostate  . Dyspnea   . Hypertension    Past Surgical History:  Procedure Laterality Date  . COLONOSCOPY N/A 08/25/2017   Procedure: COLONOSCOPY;  Surgeon: Rogene Houston, MD;  Location[facility];  Service: Endoscopy;  Laterality: N/A;  . HERNIA REPAIR    . PROSTATE SURGERY       SOCIAL HISTORY:  Social History   Socioeconomic History  . Marital status: Married    Spouse name: Not on file  . Number of children: 2  . Years of education: Not on file  . Highest education level: Not on file  Occupational History  . Occupation: Librarian, academic in supply    Comment: [employer]  Tobacco Use  . Smoking status: Former Smoker    Packs/day: 0.25    Years: 50.00    Pack years: 12.50    Types: Cigarettes    Quit date: 02/15/1990    Years since  quitting: 28.9  . Smokeless tobacco: Never Used  Substance and Sexual Activity  . Alcohol use: No  . Drug use: No  . Sexual activity: Not Currently  Other Topics Concern  . Not on file  Social History Narrative  . Not on file   Social Determinants of Health   Financial Resource Strain:   . Difficulty of Paying Living Expenses: Not on file  Food Insecurity:   . Worried About Charity fundraiser in the Last Year: Not on file  . Ran Out of Food in the Last Year: Not on file  Transportation Needs:   . Lack of Transportation (Medical): Not on file  . Lack of Transportation (Non-Medical): Not on file  Physical Activity:   . Days of Exercise per Week: Not on file  . Minutes of Exercise per Session: Not on file  Stress:   . Feeling of Stress : Not on file  Social Connections:   . Frequency of Communication with Friends and Family: Not on file  . Frequency of Social Gatherings with Friends and Family: Not on file  . Attends Religious Services: Not on file  . Active Member of Clubs or Organizations: Not on file  . Attends Archivist Meetings: Not on file  . Marital Status: Not on file  Intimate Partner Violence:   .  Fear of Current or Ex-Partner: Not on file  . Emotionally Abused: Not on file  . Physically Abused: Not on file  . Sexually Abused: Not on file    FAMILY HISTORY:  Family History  Problem Relation Age of Onset  . CAD Mother 8  . CAD Father 82  . CAD Sister   . CAD Brother     CURRENT MEDICATIONS:  Outpatient Encounter Medications as of 02/06/2019  Medication Sig  . acetaminophen (TYLENOL) 325 MG tablet Take 325 mg by mouth every 8 (eight) hours as needed for moderate pain.   Marland Kitchen atorvastatin (LIPITOR) 10 MG tablet Take 10 mg by mouth.   . fluticasone (FLONASE) 50 MCG/ACT nasal spray daily as needed.   . furosemide (LASIX) 20 MG tablet Take 20 mg by mouth daily.   Marland Kitchen loratadine (CLARITIN) 10 MG tablet Take 10 mg by mouth daily.  . metoprolol tartrate  (LOPRESSOR) 50 MG tablet Take 1 tablet (50 mg total) by mouth 2 (two) times daily.  . pantoprazole (PROTONIX) 40 MG tablet Take 1 tablet (40 mg total) by mouth daily.  . Pediatric Multivitamins-Iron (FLINTSTONES PLUS IRON) chewable tablet Chew 1 tablet by mouth 2 (two) times daily.  . rivaroxaban (XARELTO) 20 MG TABS tablet Take 1 tablet (20 mg total) by mouth daily with supper.  . tamsulosin (FLOMAX) 0.4 MG CAPS capsule Take by mouth.   No facility-administered encounter medications on file as of 02/06/2019.    ALLERGIES:  No Known Allergies   PHYSICAL EXAM:  ECOG Performance status: 1  Vitals:   02/06/19 1000  BP: (!) 141/62  Pulse: 73  Resp: 18  Temp: (!) 97.5 F (36.4 C)  SpO2: 97%   Filed Weights   02/06/19 1000  Weight: 201 lb 8 oz (91.4 kg)    Physical Exam Vitals reviewed.  Constitutional:      Appearance: Normal appearance.  Cardiovascular:     Rate and Rhythm: Normal rate and regular rhythm.     Heart sounds: Normal heart sounds.  Pulmonary:     Effort: Pulmonary effort is normal.     Breath sounds: Normal breath sounds.  Abdominal:     General: There is no distension.     Palpations: Abdomen is soft. There is no mass.  Musculoskeletal:        General: No swelling.  Lymphadenopathy:     Cervical: No cervical adenopathy.  Skin:    General: Skin is warm.  Neurological:     General: No focal deficit present.     Mental Status: He is alert and oriented to person, place, and time.  Psychiatric:        Mood and Affect: Mood normal.        Behavior: Behavior normal.      LABORATORY DATA:  I have reviewed the labs as listed.  CBC    Component Value Date/Time   WBC 7.6 01/30/2019 1016   RBC 4.10 (L) 01/30/2019 1016   HGB 12.3 (L) 01/30/2019 1016   HCT 39.8 01/30/2019 1016   PLT 214 01/30/2019 1016   MCV 97.1 01/30/2019 1016   MCH 30.0 01/30/2019 1016   MCHC 30.9 01/30/2019 1016   RDW 13.8 01/30/2019 1016   LYMPHSABS 1.1 01/30/2019 1016    MONOABS 0.7 01/30/2019 1016   EOSABS 0.2 01/30/2019 1016   BASOSABS 0.0 01/30/2019 1016   CMP Latest Ref Rng & Units 01/30/2019 10/02/2018 09/12/2018  Glucose 70 - 99 mg/dL 132(H) 132(H) 137(H)  BUN 8 -  23 mg/dL 21 13 18   Creatinine 0.61 - 1.24 mg/dL 1.19 0.90 1.02  Sodium 135 - 145 mmol/L 137 137 138  Potassium 3.5 - 5.1 mmol/L 4.6 4.2 4.4  Chloride 98 - 111 mmol/L 100 101 101  CO2 22 - 32 mmol/L 28 27 28   Calcium 8.9 - 10.3 mg/dL 9.1 8.8(L) 8.8(L)  Total Protein 6.5 - 8.1 g/dL 7.5 7.1 -  Total Bilirubin 0.3 - 1.2 mg/dL 0.5 0.5 -  Alkaline Phos 38 - 126 U/L 60 46 -  AST 15 - 41 U/L 18 19 -  ALT 0 - 44 U/L 14 12 -       DIAGNOSTIC IMAGING:  I have independently reviewed the scans and discussed with the patient.   I have reviewed Venita Lick LPN's note and agree with the documentation.  I personally performed a face-to-face visit, made revisions and my assessment and plan is as follows.    ASSESSMENT & PLAN:   Non Hodgkin's lymphoma ([place]) 1.  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 Nevada Crane, biopsy on 09/22/2017 showed atypical B-cell infiltrate concerning for B-cell lymphoma, possibly is follicular type.  CD20 was strongly positive. - He did not have any B symptoms.  Physical exam did not show any adenopathy. -PET scan on 10/31/2017 showed left frontal scalp area hypermetabolic with SUV of 3.3.  High density of liver was seen.  No hypermetabolic activity suggesting lymphoma. - XRT to the left scalp by Dr.Yanagihara from 12/08/2017 through 12/23/2017. -Examination today did not reveal any suspicious scalp lesions.  No palpable adenopathy or splenomegaly.  No B symptoms. -LDH was normal.  He will follow-up in 4 months for physical exam.  2.  Iron deficiency anemia: -Received Feraheme on 10/20/2018 and 10/27/2018.  Noticed improvement in energy levels. -Denies any bleeding per rectum or melena. -We reviewed his  labs.  Hemoglobin improved to 12.3.  Ferritin is 35.  H99 and folic acid were normal.  Creatinine is mildly elevated at 1.19. -He will continue Flintstone vitamins twice daily.  I plan to recheck his labs in 4 months.  3.  Prostate cancer: -Diagnosed 3 to 4 years ago, status post radiation therapy at [facility].      Orders placed this encounter:  Orders Placed This Encounter  Procedures  . CBC with Differential/Platelet  . Comprehensive metabolic panel  . Iron and TIBC  . Ferritin  . Lactate dehydrogenase      Derek Jack, MD [place] [phone]

## 2019-03-13 ENCOUNTER — Observation Stay (HOSPITAL_BASED_OUTPATIENT_CLINIC_OR_DEPARTMENT_OTHER): Payer: Medicare Other

## 2019-03-13 ENCOUNTER — Encounter (HOSPITAL_COMMUNITY): Payer: Self-pay | Admitting: *Deleted

## 2019-03-13 ENCOUNTER — Inpatient Hospital Stay (HOSPITAL_COMMUNITY)
Admission: EM | Admit: 2019-03-13 | Discharge: 2019-05-22 | DRG: 003 | Disposition: A | Discharge status: Rehabilitation Facility | Payer: Medicare Other | Attending: Cardiothoracic Surgery | Admitting: Cardiothoracic Surgery

## 2019-03-13 ENCOUNTER — Emergency Department (HOSPITAL_COMMUNITY): Payer: Medicare Other

## 2019-03-13 ENCOUNTER — Other Ambulatory Visit: Payer: Self-pay

## 2019-03-13 DIAGNOSIS — D638 Anemia in other chronic diseases classified elsewhere: Secondary | ICD-10-CM | POA: Diagnosis present

## 2019-03-13 DIAGNOSIS — J90 Pleural effusion, not elsewhere classified: Secondary | ICD-10-CM

## 2019-03-13 DIAGNOSIS — I4891 Unspecified atrial fibrillation: Secondary | ICD-10-CM | POA: Diagnosis present

## 2019-03-13 DIAGNOSIS — Z66 Do not resuscitate: Secondary | ICD-10-CM | POA: Diagnosis present

## 2019-03-13 DIAGNOSIS — R9431 Abnormal electrocardiogram [ECG] [EKG]: Secondary | ICD-10-CM

## 2019-03-13 DIAGNOSIS — I25119 Atherosclerotic heart disease of native coronary artery with unspecified angina pectoris: Secondary | ICD-10-CM | POA: Diagnosis present

## 2019-03-13 DIAGNOSIS — N5089 Other specified disorders of the male genital organs: Secondary | ICD-10-CM | POA: Diagnosis not present

## 2019-03-13 DIAGNOSIS — Z9689 Presence of other specified functional implants: Secondary | ICD-10-CM

## 2019-03-13 DIAGNOSIS — I2583 Coronary atherosclerosis due to lipid rich plaque: Secondary | ICD-10-CM | POA: Diagnosis present

## 2019-03-13 DIAGNOSIS — I442 Atrioventricular block, complete: Secondary | ICD-10-CM | POA: Diagnosis not present

## 2019-03-13 DIAGNOSIS — R570 Cardiogenic shock: Secondary | ICD-10-CM | POA: Diagnosis not present

## 2019-03-13 DIAGNOSIS — T82818A Embolism of vascular prosthetic devices, implants and grafts, initial encounter: Secondary | ICD-10-CM | POA: Diagnosis not present

## 2019-03-13 DIAGNOSIS — E785 Hyperlipidemia, unspecified: Secondary | ICD-10-CM | POA: Diagnosis present

## 2019-03-13 DIAGNOSIS — I10 Essential (primary) hypertension: Secondary | ICD-10-CM | POA: Diagnosis present

## 2019-03-13 DIAGNOSIS — R000 Tachycardia, unspecified: Secondary | ICD-10-CM | POA: Diagnosis not present

## 2019-03-13 DIAGNOSIS — E119 Type 2 diabetes mellitus without complications: Secondary | ICD-10-CM

## 2019-03-13 DIAGNOSIS — I482 Chronic atrial fibrillation, unspecified: Secondary | ICD-10-CM | POA: Diagnosis not present

## 2019-03-13 DIAGNOSIS — B3781 Candidal esophagitis: Secondary | ICD-10-CM | POA: Diagnosis not present

## 2019-03-13 DIAGNOSIS — T462X5A Adverse effect of other antidysrhythmic drugs, initial encounter: Secondary | ICD-10-CM | POA: Diagnosis not present

## 2019-03-13 DIAGNOSIS — J9601 Acute respiratory failure with hypoxia: Secondary | ICD-10-CM

## 2019-03-13 DIAGNOSIS — N186 End stage renal disease: Secondary | ICD-10-CM | POA: Diagnosis present

## 2019-03-13 DIAGNOSIS — K627 Radiation proctitis: Secondary | ICD-10-CM | POA: Diagnosis present

## 2019-03-13 DIAGNOSIS — E440 Moderate protein-calorie malnutrition: Secondary | ICD-10-CM | POA: Diagnosis present

## 2019-03-13 DIAGNOSIS — I480 Paroxysmal atrial fibrillation: Secondary | ICD-10-CM

## 2019-03-13 DIAGNOSIS — Z923 Personal history of irradiation: Secondary | ICD-10-CM

## 2019-03-13 DIAGNOSIS — N3041 Irradiation cystitis with hematuria: Secondary | ICD-10-CM | POA: Diagnosis present

## 2019-03-13 DIAGNOSIS — I2510 Atherosclerotic heart disease of native coronary artery without angina pectoris: Secondary | ICD-10-CM

## 2019-03-13 DIAGNOSIS — G9341 Metabolic encephalopathy: Secondary | ICD-10-CM | POA: Diagnosis not present

## 2019-03-13 DIAGNOSIS — Z7901 Long term (current) use of anticoagulants: Secondary | ICD-10-CM

## 2019-03-13 DIAGNOSIS — I214 Non-ST elevation (NSTEMI) myocardial infarction: Secondary | ICD-10-CM

## 2019-03-13 DIAGNOSIS — J9690 Respiratory failure, unspecified, unspecified whether with hypoxia or hypercapnia: Secondary | ICD-10-CM

## 2019-03-13 DIAGNOSIS — E871 Hypo-osmolality and hyponatremia: Secondary | ICD-10-CM | POA: Diagnosis not present

## 2019-03-13 DIAGNOSIS — J151 Pneumonia due to Pseudomonas: Secondary | ICD-10-CM | POA: Diagnosis not present

## 2019-03-13 DIAGNOSIS — Z8546 Personal history of malignant neoplasm of prostate: Secondary | ICD-10-CM

## 2019-03-13 DIAGNOSIS — Z452 Encounter for adjustment and management of vascular access device: Secondary | ICD-10-CM

## 2019-03-13 DIAGNOSIS — I82612 Acute embolism and thrombosis of superficial veins of left upper extremity: Secondary | ICD-10-CM | POA: Diagnosis not present

## 2019-03-13 DIAGNOSIS — R0902 Hypoxemia: Secondary | ICD-10-CM

## 2019-03-13 DIAGNOSIS — N320 Bladder-neck obstruction: Secondary | ICD-10-CM | POA: Diagnosis not present

## 2019-03-13 DIAGNOSIS — L8990 Pressure ulcer of unspecified site, unspecified stage: Secondary | ICD-10-CM | POA: Insufficient documentation

## 2019-03-13 DIAGNOSIS — I509 Heart failure, unspecified: Secondary | ICD-10-CM

## 2019-03-13 DIAGNOSIS — N401 Enlarged prostate with lower urinary tract symptoms: Secondary | ICD-10-CM | POA: Diagnosis present

## 2019-03-13 DIAGNOSIS — Z515 Encounter for palliative care: Secondary | ICD-10-CM

## 2019-03-13 DIAGNOSIS — Z992 Dependence on renal dialysis: Secondary | ICD-10-CM

## 2019-03-13 DIAGNOSIS — H9190 Unspecified hearing loss, unspecified ear: Secondary | ICD-10-CM | POA: Diagnosis present

## 2019-03-13 DIAGNOSIS — I5032 Chronic diastolic (congestive) heart failure: Secondary | ICD-10-CM | POA: Diagnosis present

## 2019-03-13 DIAGNOSIS — Y95 Nosocomial condition: Secondary | ICD-10-CM | POA: Diagnosis not present

## 2019-03-13 DIAGNOSIS — Z951 Presence of aortocoronary bypass graft: Secondary | ICD-10-CM

## 2019-03-13 DIAGNOSIS — T85598S Other mechanical complication of other gastrointestinal prosthetic devices, implants and grafts, sequela: Secondary | ICD-10-CM

## 2019-03-13 DIAGNOSIS — Z95811 Presence of heart assist device: Secondary | ICD-10-CM

## 2019-03-13 DIAGNOSIS — Z6835 Body mass index (BMI) 35.0-35.9, adult: Secondary | ICD-10-CM

## 2019-03-13 DIAGNOSIS — R109 Unspecified abdominal pain: Secondary | ICD-10-CM

## 2019-03-13 DIAGNOSIS — K219 Gastro-esophageal reflux disease without esophagitis: Secondary | ICD-10-CM | POA: Diagnosis present

## 2019-03-13 DIAGNOSIS — D696 Thrombocytopenia, unspecified: Secondary | ICD-10-CM | POA: Diagnosis present

## 2019-03-13 DIAGNOSIS — Z9889 Other specified postprocedural states: Secondary | ICD-10-CM

## 2019-03-13 DIAGNOSIS — D6489 Other specified anemias: Secondary | ICD-10-CM | POA: Diagnosis present

## 2019-03-13 DIAGNOSIS — I132 Hypertensive heart and chronic kidney disease with heart failure and with stage 5 chronic kidney disease, or end stage renal disease: Secondary | ICD-10-CM | POA: Diagnosis present

## 2019-03-13 DIAGNOSIS — K5521 Angiodysplasia of colon with hemorrhage: Secondary | ICD-10-CM | POA: Diagnosis not present

## 2019-03-13 DIAGNOSIS — L89891 Pressure ulcer of other site, stage 1: Secondary | ICD-10-CM | POA: Diagnosis not present

## 2019-03-13 DIAGNOSIS — Z955 Presence of coronary angioplasty implant and graft: Secondary | ICD-10-CM

## 2019-03-13 DIAGNOSIS — D62 Acute posthemorrhagic anemia: Secondary | ICD-10-CM | POA: Diagnosis not present

## 2019-03-13 DIAGNOSIS — E1122 Type 2 diabetes mellitus with diabetic chronic kidney disease: Secondary | ICD-10-CM | POA: Diagnosis present

## 2019-03-13 DIAGNOSIS — Z8249 Family history of ischemic heart disease and other diseases of the circulatory system: Secondary | ICD-10-CM

## 2019-03-13 DIAGNOSIS — Y842 Radiological procedure and radiotherapy as the cause of abnormal reaction of the patient, or of later complication, without mention of misadventure at the time of the procedure: Secondary | ICD-10-CM | POA: Diagnosis present

## 2019-03-13 DIAGNOSIS — I9589 Other hypotension: Secondary | ICD-10-CM | POA: Diagnosis not present

## 2019-03-13 DIAGNOSIS — E1165 Type 2 diabetes mellitus with hyperglycemia: Secondary | ICD-10-CM | POA: Diagnosis not present

## 2019-03-13 DIAGNOSIS — L270 Generalized skin eruption due to drugs and medicaments taken internally: Secondary | ICD-10-CM | POA: Diagnosis not present

## 2019-03-13 DIAGNOSIS — I255 Ischemic cardiomyopathy: Secondary | ICD-10-CM | POA: Diagnosis present

## 2019-03-13 DIAGNOSIS — Z79899 Other long term (current) drug therapy: Secondary | ICD-10-CM

## 2019-03-13 DIAGNOSIS — N179 Acute kidney failure, unspecified: Secondary | ICD-10-CM

## 2019-03-13 DIAGNOSIS — Z8572 Personal history of non-Hodgkin lymphomas: Secondary | ICD-10-CM

## 2019-03-13 DIAGNOSIS — Z4659 Encounter for fitting and adjustment of other gastrointestinal appliance and device: Secondary | ICD-10-CM

## 2019-03-13 DIAGNOSIS — E874 Mixed disorder of acid-base balance: Secondary | ICD-10-CM | POA: Diagnosis not present

## 2019-03-13 DIAGNOSIS — Z87891 Personal history of nicotine dependence: Secondary | ICD-10-CM

## 2019-03-13 DIAGNOSIS — Z20822 Contact with and (suspected) exposure to covid-19: Secondary | ICD-10-CM | POA: Diagnosis present

## 2019-03-13 DIAGNOSIS — J918 Pleural effusion in other conditions classified elsewhere: Secondary | ICD-10-CM | POA: Diagnosis not present

## 2019-03-13 DIAGNOSIS — Z419 Encounter for procedure for purposes other than remedying health state, unspecified: Secondary | ICD-10-CM

## 2019-03-13 DIAGNOSIS — N2581 Secondary hyperparathyroidism of renal origin: Secondary | ICD-10-CM | POA: Diagnosis present

## 2019-03-13 DIAGNOSIS — R0602 Shortness of breath: Secondary | ICD-10-CM

## 2019-03-13 DIAGNOSIS — E669 Obesity, unspecified: Secondary | ICD-10-CM | POA: Diagnosis present

## 2019-03-13 DIAGNOSIS — N3289 Other specified disorders of bladder: Secondary | ICD-10-CM | POA: Diagnosis not present

## 2019-03-13 DIAGNOSIS — K921 Melena: Secondary | ICD-10-CM | POA: Diagnosis not present

## 2019-03-13 DIAGNOSIS — Z95828 Presence of other vascular implants and grafts: Secondary | ICD-10-CM

## 2019-03-13 DIAGNOSIS — E875 Hyperkalemia: Secondary | ICD-10-CM | POA: Diagnosis not present

## 2019-03-13 DIAGNOSIS — Y848 Other medical procedures as the cause of abnormal reaction of the patient, or of later complication, without mention of misadventure at the time of the procedure: Secondary | ICD-10-CM | POA: Diagnosis not present

## 2019-03-13 DIAGNOSIS — J9600 Acute respiratory failure, unspecified whether with hypoxia or hypercapnia: Secondary | ICD-10-CM

## 2019-03-13 DIAGNOSIS — N029 Recurrent and persistent hematuria with unspecified morphologic changes: Secondary | ICD-10-CM | POA: Diagnosis not present

## 2019-03-13 DIAGNOSIS — I5043 Acute on chronic combined systolic (congestive) and diastolic (congestive) heart failure: Secondary | ICD-10-CM

## 2019-03-13 DIAGNOSIS — R001 Bradycardia, unspecified: Secondary | ICD-10-CM | POA: Diagnosis not present

## 2019-03-13 DIAGNOSIS — J989 Respiratory disorder, unspecified: Secondary | ICD-10-CM

## 2019-03-13 DIAGNOSIS — C8590 Non-Hodgkin lymphoma, unspecified, unspecified site: Secondary | ICD-10-CM | POA: Diagnosis present

## 2019-03-13 DIAGNOSIS — N170 Acute kidney failure with tubular necrosis: Secondary | ICD-10-CM | POA: Diagnosis not present

## 2019-03-13 DIAGNOSIS — Z430 Encounter for attention to tracheostomy: Secondary | ICD-10-CM

## 2019-03-13 DIAGNOSIS — Z9861 Coronary angioplasty status: Secondary | ICD-10-CM

## 2019-03-13 LAB — BASIC METABOLIC PANEL
Anion gap: 10 (ref 5–15)
BUN: 14 mg/dL (ref 8–23)
CO2: 26 mmol/L (ref 22–32)
Calcium: 8.6 mg/dL — ABNORMAL LOW (ref 8.9–10.3)
Chloride: 102 mmol/L (ref 98–111)
Creatinine, Ser: 0.96 mg/dL (ref 0.61–1.24)
GFR calc Af Amer: >60 mL/min (ref >60)
GFR calc non Af Amer: >60 mL/min (ref >60)
Glucose, Bld: 208 mg/dL — ABNORMAL HIGH (ref 70–99)
Potassium: 3.9 mmol/L (ref 3.5–5.1)
Sodium: 138 mmol/L (ref 135–145)

## 2019-03-13 LAB — RESPIRATORY PANEL BY RT PCR (FLU A&B, COVID)
Influenza A by PCR: NEGATIVE
Influenza B by PCR: NEGATIVE
SARS Coronavirus 2 by RT PCR: NEGATIVE

## 2019-03-13 LAB — CBC WITH DIFFERENTIAL/PLATELET
Abs Immature Granulocytes: 0.02 10*3/uL (ref 0.00–0.07)
Basophils Absolute: 0 10*3/uL (ref 0.0–0.1)
Basophils Relative: 0 %
Eosinophils Absolute: 0.1 10*3/uL (ref 0.0–0.5)
Eosinophils Relative: 2 %
HCT: 37.1 % — ABNORMAL LOW (ref 39.0–52.0)
Hemoglobin: 11.3 g/dL — ABNORMAL LOW (ref 13.0–17.0)
Immature Granulocytes: 0 %
Lymphocytes Relative: 9 %
Lymphs Abs: 0.6 10*3/uL — ABNORMAL LOW (ref 0.7–4.0)
MCH: 29.1 pg (ref 26.0–34.0)
MCHC: 30.5 g/dL (ref 30.0–36.0)
MCV: 95.6 fL (ref 80.0–100.0)
Monocytes Absolute: 0.5 10*3/uL (ref 0.1–1.0)
Monocytes Relative: 7 %
Neutro Abs: 5.3 10*3/uL (ref 1.7–7.7)
Neutrophils Relative %: 82 %
Platelets: 187 10*3/uL (ref 150–400)
RBC: 3.88 MIL/uL — ABNORMAL LOW (ref 4.22–5.81)
RDW: 14.4 % (ref 11.5–15.5)
WBC: 6.5 10*3/uL (ref 4.0–10.5)
nRBC: 0 % (ref 0.0–0.2)

## 2019-03-13 LAB — ECHOCARDIOGRAM COMPLETE
Height: 68 in
Weight: 3248 oz

## 2019-03-13 LAB — GLUCOSE, CAPILLARY
Glucose-Capillary: 122 mg/dL — ABNORMAL HIGH (ref 70–99)
Glucose-Capillary: 127 mg/dL — ABNORMAL HIGH (ref 70–99)

## 2019-03-13 LAB — HEMOGLOBIN A1C
Hgb A1c MFr Bld: 6.1 % — ABNORMAL HIGH (ref 4.8–5.6)
Mean Plasma Glucose: 128.37 mg/dL

## 2019-03-13 LAB — TSH: TSH: 3.683 u[IU]/mL (ref 0.350–4.500)

## 2019-03-13 LAB — TROPONIN I (HIGH SENSITIVITY)
Troponin I (High Sensitivity): 32 ng/L — ABNORMAL HIGH (ref <18)
Troponin I (High Sensitivity): 440 ng/L (ref <18)

## 2019-03-13 LAB — MAGNESIUM: Magnesium: 2.1 mg/dL (ref 1.7–2.4)

## 2019-03-13 MED ORDER — PANTOPRAZOLE SODIUM 40 MG PO TBEC
40.0000 mg | DELAYED_RELEASE_TABLET | Freq: Every day | ORAL | Status: DC
  Administered 2019-03-14 – 2019-03-15 (×2): 40 mg via ORAL
  Filled 2019-03-13 (×2): qty 1

## 2019-03-13 MED ORDER — ACETAMINOPHEN 325 MG PO TABS
650.0000 mg | ORAL_TABLET | Freq: Four times a day (QID) | ORAL | Status: DC | PRN
  Administered 2019-03-14: 650 mg via ORAL
  Filled 2019-03-13: qty 2

## 2019-03-13 MED ORDER — ACETAMINOPHEN 650 MG RE SUPP: 650.0000 mg | Freq: Four times a day (QID) | RECTAL | Status: DC | PRN

## 2019-03-13 MED ORDER — FENTANYL CITRATE (PF) 100 MCG/2ML IJ SOLN: 100.0000 ug | Freq: Once | INTRAMUSCULAR | Status: DC

## 2019-03-13 MED ORDER — ADULT MULTIVITAMIN W/MINERALS CH
1.0000 | ORAL_TABLET | Freq: Every day | ORAL | Status: DC
  Administered 2019-03-14 – 2019-03-15 (×2): 1 via ORAL
  Filled 2019-03-13 (×2): qty 1

## 2019-03-13 MED ORDER — ONDANSETRON HCL 4 MG PO TABS: 4.0000 mg | ORAL_TABLET | Freq: Four times a day (QID) | ORAL | Status: DC | PRN

## 2019-03-13 MED ORDER — MIDAZOLAM HCL 5 MG/5ML IJ SOLN
4.0000 mg | Freq: Once | INTRAMUSCULAR | Status: AC
  Administered 2019-03-13: 1 mg via INTRAVENOUS
  Filled 2019-03-13: qty 5

## 2019-03-13 MED ORDER — ATORVASTATIN CALCIUM 10 MG PO TABS
10.0000 mg | ORAL_TABLET | Freq: Every day | ORAL | Status: DC
  Filled 2019-03-13: qty 1

## 2019-03-13 MED ORDER — ASPIRIN EC 81 MG PO TBEC
81.0000 mg | DELAYED_RELEASE_TABLET | Freq: Every day | ORAL | Status: DC
  Administered 2019-03-14 – 2019-03-15 (×2): 81 mg via ORAL
  Filled 2019-03-13 (×2): qty 1

## 2019-03-13 MED ORDER — FENTANYL CITRATE (PF) 100 MCG/2ML IJ SOLN
100.0000 ug | Freq: Once | INTRAMUSCULAR | Status: AC
  Administered 2019-03-13: 100 ug via INTRAVENOUS
  Filled 2019-03-13: qty 2

## 2019-03-13 MED ORDER — FUROSEMIDE 20 MG PO TABS
20.0000 mg | ORAL_TABLET | Freq: Every day | ORAL | Status: DC
  Administered 2019-03-14: 20 mg via ORAL
  Filled 2019-03-13: qty 1

## 2019-03-13 MED ORDER — MIDAZOLAM HCL 2 MG/2ML IJ SOLN
2.0000 mg | Freq: Once | INTRAMUSCULAR | Status: AC
  Administered 2019-03-13: 2 mg via INTRAVENOUS

## 2019-03-13 MED ORDER — TAMSULOSIN HCL 0.4 MG PO CAPS
0.4000 mg | ORAL_CAPSULE | Freq: Every day | ORAL | Status: DC
  Administered 2019-03-13 – 2019-03-15 (×3): 0.4 mg via ORAL
  Filled 2019-03-13 (×2): qty 1

## 2019-03-13 MED ORDER — METOPROLOL TARTRATE 5 MG/5ML IV SOLN
5.0000 mg | INTRAVENOUS | Status: DC | PRN
  Administered 2019-03-13: 5 mg via INTRAVENOUS
  Filled 2019-03-13 (×2): qty 5

## 2019-03-13 MED ORDER — INSULIN ASPART 100 UNIT/ML ~~LOC~~ SOLN
0.0000 [IU] | Freq: Three times a day (TID) | SUBCUTANEOUS | Status: DC
  Administered 2019-03-14 – 2019-03-15 (×2): 1 [IU] via SUBCUTANEOUS

## 2019-03-13 MED ORDER — THIAMINE HCL 100 MG PO TABS
100.0000 mg | ORAL_TABLET | Freq: Every day | ORAL | Status: DC
  Administered 2019-03-13 – 2019-03-14 (×2): 100 mg via ORAL
  Filled 2019-03-13 (×2): qty 1

## 2019-03-13 MED ORDER — SODIUM CHLORIDE 0.9% FLUSH: 3.0000 mL | INTRAVENOUS | Status: DC | PRN

## 2019-03-13 MED ORDER — CHLORHEXIDINE GLUCONATE CLOTH 2 % EX PADS
6.0000 | MEDICATED_PAD | Freq: Every day | CUTANEOUS | Status: DC
  Administered 2019-03-13 – 2019-03-15 (×3): 6 via TOPICAL

## 2019-03-13 MED ORDER — FOLIC ACID 1 MG PO TABS
1.0000 mg | ORAL_TABLET | Freq: Every day | ORAL | Status: DC
  Administered 2019-03-13 – 2019-03-15 (×3): 1 mg via ORAL
  Filled 2019-03-13 (×4): qty 1

## 2019-03-13 MED ORDER — ONDANSETRON HCL 4 MG/2ML IJ SOLN: 4.0000 mg | Freq: Four times a day (QID) | INTRAMUSCULAR | Status: DC | PRN

## 2019-03-13 MED ORDER — SODIUM CHLORIDE 0.9 % IV SOLN: 250.0000 mL | INTRAVENOUS | Status: DC | PRN

## 2019-03-13 MED ORDER — INSULIN ASPART 100 UNIT/ML ~~LOC~~ SOLN: 0.0000 [IU] | Freq: Every day | SUBCUTANEOUS | Status: DC

## 2019-03-13 MED ORDER — METOPROLOL TARTRATE 50 MG PO TABS
50.0000 mg | ORAL_TABLET | Freq: Three times a day (TID) | ORAL | Status: DC
  Administered 2019-03-13 – 2019-03-16 (×8): 50 mg via ORAL
  Filled 2019-03-13 (×8): qty 1

## 2019-03-13 MED ORDER — POLYETHYLENE GLYCOL 3350 17 G PO PACK: 17.0000 g | PACK | Freq: Every day | ORAL | Status: DC | PRN

## 2019-03-13 MED ORDER — METOPROLOL TARTRATE 5 MG/5ML IV SOLN
5.0000 mg | Freq: Once | INTRAVENOUS | Status: AC
  Administered 2019-03-13: 5 mg via INTRAVENOUS
  Filled 2019-03-13: qty 5

## 2019-03-13 MED ORDER — SODIUM CHLORIDE 0.9% FLUSH
3.0000 mL | Freq: Two times a day (BID) | INTRAVENOUS | Status: DC
  Administered 2019-03-13 – 2019-03-15 (×3): 3 mL via INTRAVENOUS

## 2019-03-13 MED ORDER — TRAZODONE HCL 50 MG PO TABS: 50.0000 mg | ORAL_TABLET | Freq: Every evening | ORAL | Status: DC | PRN

## 2019-03-13 MED ORDER — METOPROLOL TARTRATE 5 MG/5ML IV SOLN: 5.0000 mg | Freq: Once | INTRAVENOUS | Status: DC

## 2019-03-13 MED ORDER — LORATADINE 10 MG PO TABS
10.0000 mg | ORAL_TABLET | Freq: Every day | ORAL | Status: DC
  Administered 2019-03-14 – 2019-03-15 (×2): 10 mg via ORAL
  Filled 2019-03-13 (×2): qty 1

## 2019-03-13 MED ORDER — RIVAROXABAN 20 MG PO TABS
20.0000 mg | ORAL_TABLET | Freq: Every day | ORAL | Status: DC
  Administered 2019-03-13: 20 mg via ORAL
  Filled 2019-03-13: qty 1

## 2019-03-13 NOTE — ED Provider Notes (Signed)
[facility] EMERGENCY DEPARTMENT Provider Note   CSN: [id number] Arrival date & time: 03/13/19  1030     History Chief Complaint  Patient presents with  . Tachycardia    Jesse Murphy is a 86 y.o. male.  HPI   This patient is an 86 year old male with a history of atrial fibrillation.  History of coronary disease status post intervention in 1992, medical management after that point.  History of prostate cancer as well.  He is on Xarelto and has been on that medication for at least 5 years has not missed any doses in the last 3 months and last took his medicine last night.  He woke up this morning with no abnormal feelings, after breakfast he developed acute onset of chest pain palpitations bilateral arm aches and some shortness of breath.  This has been persistent, gradually improving, was found to have heart rates in the 180s with atrial fibrillation and irregularity.  He denies any coughing, fever, sore throat, swelling of the legs, diarrhea or any other symptoms.  He arrives by paramedic transport with abnormal vitals.  No medications given prehospital.  Symptoms are severe on EMS arrival but on ER arrival he feels better.  Past Medical History:  Diagnosis Date  . Atrial fibrillation ([place])   . CAD (coronary artery disease)    a. s/p PCI in 1992 b. Coronary CT in 01/2018 showing extensive coronary calcification; FFR indeterminate --> medical management pursued at that time as patient not interested in repeat cath  . Cancer [facility])    prostate  . Dyspnea   . Hypertension     Patient Active Problem List   Diagnosis Date Noted  . Chest pain 09/12/2018  . Chronic diastolic heart failure ([place]) 05/29/2018  . Hypertension 05/29/2018  . Non Hodgkin's lymphoma ([place]) 09/30/2017  . Heme positive stool 08/11/2017  . Chronic anticoagulation 07/05/2016  . CAD S/P percutaneous coronary angioplasty 07/05/2016  . Type 2 diabetes mellitus without complications ([place]) 58/85/0277  . Paroxysmal  atrial fibrillation ([place]) 06/14/2016  . Chest pain in adult 06/14/2016  . Dyspnea on exertion 06/14/2016  . Allergic rhinitis 06/14/2016  . GERD (gastroesophageal reflux disease) 06/14/2016  . BPH (benign prostatic hyperplasia) 06/14/2016    Past Surgical History:  Procedure Laterality Date  . COLONOSCOPY N/A 08/25/2017   Procedure: COLONOSCOPY;  Surgeon: Rogene Houston, MD;  Location: [facility] ENDO [facility]ITE;  Service: Endoscopy;  Laterality: N/A;  . HERNIA REPAIR    . PROSTATE SURGERY         Family History  Problem Relation Age of Onset  . CAD Mother 76  . CAD Father 66  . CAD Sister   . CAD Brother     Social History   Tobacco Use  . Smoking status: Former Smoker    Packs/day: 0.25    Years: 50.00    Pack years: 12.50    Types: Cigarettes    Quit date: 02/15/1990    Years since quitting: 29.0  . Smokeless tobacco: Never Used  Substance Use Topics  . Alcohol use: No  . Drug use: No    Home Medications Prior to Admission medications   Medication Sig Start Date End Date Taking? Authorizing Provider  acetaminophen (TYLENOL) 325 MG tablet Take 325 mg by mouth every 8 (eight) hours as needed for moderate pain.     [provider]  atorvastatin (LIPITOR) 10 MG tablet Take 10 mg by mouth.  09/14/18 09/14/19  [provider]  fluticasone (FLONASE) 50  MCG/ACT nasal spray daily as needed.  10/08/14   [provider]  furosemide (LASIX) 20 MG tablet Take 20 mg by mouth daily.  10/13/17   [provider]  loratadine (CLARITIN) 10 MG tablet Take 10 mg by mouth daily.    [provider]  metoprolol tartrate (LOPRESSOR) 50 MG tablet Take 1 tablet (50 mg total) by mouth 2 (two) times daily. 10/03/18 02/06/19  Strader, Fransisco Hertz, PA-C  pantoprazole (PROTONIX) 40 MG tablet Take 1 tablet (40 mg total) by mouth daily. 09/14/18   Elgergawy, Silver Huguenin, MD  Pediatric Multivitamins-Iron (FLINTSTONES PLUS IRON) chewable tablet Chew 1 tablet by mouth 2 (two)  times daily. 08/25/17   Rogene Houston, MD  rivaroxaban (XARELTO) 20 MG TABS tablet Take 1 tablet (20 mg total) by mouth daily with supper. 08/28/17   Rogene Houston, MD  tamsulosin (FLOMAX) 0.4 MG CAPS capsule Take by mouth. 01/15/19   [provider]    Allergies    Patient has no known allergies.  Review of Systems   Review of Systems  All other systems reviewed and are negative.   Physical Exam Updated Vital Signs BP (!) 120/92 (BP Location: Right Arm)   Pulse (!) 122   Temp 97.8 F (36.6 C) (Oral)   Resp (!) 22   Ht 1.727 m (5\' 8" )   Wt 92.1 kg   SpO2 99%   BMI 30.87 kg/m   Physical Exam Vitals and nursing note reviewed.  Constitutional:      General: He is in acute distress.     Appearance: He is well-developed.  HENT:     Head: Normocephalic and atraumatic.     Mouth/Throat:     Pharynx: No oropharyngeal exudate.  Eyes:     General: No scleral icterus.       Right eye: No discharge.        Left eye: No discharge.     Conjunctiva/sclera: Conjunctivae normal.     Pupils: Pupils are equal, round, and reactive to light.  Neck:     Thyroid: No thyromegaly.     Vascular: No JVD.  Cardiovascular:     Rate and Rhythm: Tachycardia present. Rhythm irregular.     Heart sounds: Normal heart sounds. No murmur. No friction rub. No gallop.   Pulmonary:     Effort: Pulmonary effort is normal. No respiratory distress.     Breath sounds: Normal breath sounds. No wheezing or rales.  Abdominal:     General: Bowel sounds are normal. There is no distension.     Palpations: Abdomen is soft. There is no mass.     Tenderness: There is no abdominal tenderness.  Musculoskeletal:        General: No tenderness. Normal range of motion.     Cervical back: Normal range of motion and neck supple.  Lymphadenopathy:     Cervical: No cervical adenopathy.  Skin:    General: Skin is warm and dry.     Findings: No erythema or rash.  Neurological:     Mental Status: He is  alert.     Coordination: Coordination normal.  Psychiatric:        Behavior: Behavior normal.     ED Results / Procedures / Treatments   Labs (all labs ordered are listed, but only abnormal results are displayed) Labs Reviewed  CBC WITH DIFFERENTIAL/PLATELET  BASIC METABOLIC PANEL  TROPONIN I (HIGH SENSITIVITY)    EKG EKG Interpretation  Date/Time:  Tuesday March 13 2019 10:40:18 EST Ventricular Rate:  145 PR Interval:    QRS Duration: 128 QT Interval:  338 QTC Calculation: 525 R Axis:   122 Text Interpretation: Wide-QRS tachycardia Nonspecific intraventricular conduction delay Since last tracing rate faster irregulary rhythm Confirmed by Noemi Chapel [phone]) on 03/13/2019 10:48:09 AM   EKG Interpretation  Date/Time:  Tuesday March 13 2019 12:28:24 EST Ventricular Rate:  105 PR Interval:    QRS Duration: 137 QT Interval:  367 QTC Calculation: 485 R Axis:   115 Text Interpretation: Atrial fibrillation Ventricular premature complex Nonspecific intraventricular conduction delay Anteroseptal infarct, old Repol abnrm suggests ischemia, diffuse leads Since last tracing rate slower Confirmed by Noemi Chapel [phone]) on 03/13/2019 12:32:53 PM        Radiology No results found.  Procedures .Cardioversion  Date/Time: 03/13/2019 12:33 PM Performed by: Noemi Chapel, MD Authorized by: Noemi Chapel, MD   Consent:    Consent obtained:  Written   Consent given by:  Patient   Risks discussed:  Cutaneous burn, death, induced arrhythmia and pain   Alternatives discussed:  Rate-control medication and alternative treatment Pre-procedure details:    Cardioversion basis:  Elective   Rhythm:  Atrial fibrillation   Electrode placement:  Anterior-posterior Patient sedated: Yes. Refer to sedation procedure documentation for details of sedation.  Attempt one:    Cardioversion mode:  Synchronous   Waveform:  Biphasic   Shock (Joules):  100   Shock outcome:  No change in  rhythm Attempt two:    Cardioversion mode:  Synchronous   Waveform:  Biphasic   Shock (Joules):  200   Shock outcome:  No change in rhythm Post-procedure details:    Patient status:  Alert   Patient tolerance of procedure:  Tolerated well, no immediate complications Comments:           .Critical Care Performed by: Noemi Chapel, MD Authorized by: Noemi Chapel, MD   Critical care provider statement:    Critical care time (minutes):  35   Critical care time was exclusive of:  Separately billable procedures and treating other patients and teaching time   Critical care was necessary to treat or prevent imminent or life-threatening deterioration of the following conditions:  Cardiac failure   Critical care was time spent personally by me on the following activities:  Blood draw for specimens, development of treatment plan with patient or surrogate, discussions with consultants, evaluation of patient's response to treatment, examination of patient, obtaining history from patient or surrogate, ordering and performing treatments and interventions, ordering and review of laboratory studies, ordering and review of radiographic studies, pulse oximetry, re-evaluation of patient's condition and review of old charts Comments:         (including critical care time)  Medications Ordered in ED Medications - No data to display  ED Course  I have reviewed the triage vital signs and the nursing notes.  Pertinent labs & imaging results that were available during my care of the patient were reviewed by me and considered in my medical decision making (see chart for details).  Clinical Course as of Mar 12 1229  Tue Mar 13, 2019  1151 Viewed the patient's last EKG from July and prior EKGs, his baseline seems to be sinus rhythm   [BM]    Clinical Course User Index [BM] Noemi Chapel, MD   MDM Rules/Calculators/A&P                      The patient's heart  rate is tachycardic, very irregular  consistent with atrial fibrillation.  Based on old EKGs he has a baseline bundle branch block so not unusual to see a wide QRS on today's EKG.  This seems to be more atrial fibrillation with aberrancy rather than ventricular tachycardia or ventricular fibrillation.  The patient is appropriately anticoagulated, will check labs, chest x-ray, cardiac monitoring has been initiated and the patient may benefit from cardioversion.  I discussed the risk benefits and alternatives with the patient and he has agreed to proceed.  Unfortunately the patient did not convert back to normal sinus rhythm with either of the cardioversion attempts.  I have given him 5 mg of Lopressor, his heart rate is still in the 110-120 range, he may need to be admitted to the hospital.  Will discuss with cardiology and admit to hospitalist - may need drip  10:48 AM Cardiac monitoring reveals atrial fibrillation with rapid ventricular rate, wide QRS (Rate & rhythm), as reviewed and interpreted by me. Cardiac monitoring was ordered due to abnormal rhythm, chest pain and to monitor patient for dysrhythmia.   Final Clinical Impression(s) / ED Diagnoses Final diagnoses:  None    Rx / DC Orders ED Discharge Orders    None       Noemi Chapel, MD 03/14/19 1757

## 2019-03-13 NOTE — ED Notes (Signed)
CRITICAL VALUE ALERT  Critical Value:  Trop 440  Date & Time Notied:  03/13/2019  Provider Notified: Dr. Denton Brick  Orders Received/Actions taken: no new orders

## 2019-03-13 NOTE — ED Notes (Signed)
100 sync Joules delivered via [place] at this time but heart rhythm remained in afib at rate 110.

## 2019-03-13 NOTE — ED Notes (Signed)
Echo in progress at this time.

## 2019-03-13 NOTE — H&P (Signed)
Patient Demographics:    Jesse Murphy, is a 86 y.o. male  MRN: [id number]   DOB - [birth year]  Admit Date - 03/13/2019  Outpatient Primary MD for the patient is Sandi Mealy, MD   Assessment & Plan:    Principal Problem:   Atrial fibrillation with rapid ventricular response ([place]) Active Problems:   CAD S/P percutaneous coronary angioplasty   GERD (gastroesophageal reflux disease)   Chronic anticoagulation   Type 2 diabetes mellitus without complications ([place])   Non Hodgkin's lymphoma ([place])   Chronic diastolic heart failure (HCC)   Hypertension   1) chronic A. fib with RVR-- by EMS with concerns about tachycardia with heart rate around 200 -EMS gave IV adenosine heart rate slowed down to 150s in the ED patient received IV Cardizem remains tachycardic -Underwent cardioversion with DC x2 remained in A. Fib -Continue Xarelto -TSH 3.68, magnesium is 2.1, potassium is 3.9 -Echo with EF down to 30 to 35% with global hypokinesis and paradoxical septal motion (EF was 55 to 60% on 12/29/2017) -Avoid Cardizem for rate control given drop in EF -Continue metoprolol for rate control -May need IV amiodarone and further cardioversion -We will await cardiology input  2)H/o CAD--prior angioplasty--- troponin up to 440 in the setting of A. fib with RVR, --???  Demand ischemia in the setting of persistent tachycardia,  -Echo with reduced EF compared to prior--please see #1 above -Cardiology input requested -Give aspirin Lipitor metoprolol  3)DM2-history of diet-controlled diabetes, A1c 6.1 reflecting excellent diabetic control, Use Novolog/Humalog Sliding scale insulin with Accu-Cheks/Fingersticks as ordered   4) history of non-Hodgkin's lymphoma and chronic anemia--- patient follows with Dr. Delton Coombes -Globin stable  at 11.3  With History of - Reviewed by me  Past Medical History:  Diagnosis Date  . Atrial fibrillation ([place])   . CAD (coronary artery disease)    a. s/p PCI in 1992 b. Coronary CT in 01/2018 showing extensive coronary calcification; FFR indeterminate --> medical management pursued at that time as patient not interested in repeat cath  . Cancer [facility])    prostate  . Dyspnea   . Hypertension       Past Surgical History:  Procedure Laterality Date  . COLONOSCOPY N/A 08/25/2017   Procedure: COLONOSCOPY;  Surgeon: Rogene Houston, MD;  Location: [facility];  Service: Endoscopy;  Laterality: N/A;  . HERNIA REPAIR    . PROSTATE SURGERY        Chief Complaint  Patient presents with  . Tachycardia      HPI:    Jesse Murphy  is a 86 y.o. male with past medical history relevant for chronic atrial fibrillation on chronic anticoagulation with Xarelto as well as history of non-Hodgkin's lymphoma, chronic anemia, HTN, GERD, DM and diastolic CHF who presents to ED by EMS with concerns about tachycardia with heart rate around 200 -EMS gave IV adenosine heart rate slowed down to 150s in the ED patient received  IV Cardizem remains tachycardic -Underwent cardioversion with DC x2 remained in A. Fib -TSH 3.68, magnesium is 2.1, potassium is 3.9  -Echo with EF down to 30 to 35% with global hypokinesis and paradoxical septal motion (EF was 55 to 60% on 12/29/2017)  -EDP states that he spoke with on-call cardiologist, official cardiology consult pending  -Patient denies pleuritic symptoms no leg pain leg swelling or significant chest pain at this time    Review of systems:    In addition to the HPI above,   A full Review of  Systems was done, all other systems reviewed are negative except as noted above in HPI , .    Social History:  Reviewed by me    Social History   Tobacco Use  . Smoking status: Former Smoker    Packs/day: 0.25    Years: 50.00    Pack years: 12.50    Types:  Cigarettes    Quit date: 02/15/1990    Years since quitting: 29.0  . Smokeless tobacco: Never Used  Substance Use Topics  . Alcohol use: No     Family History :  Reviewed by me    Family History  Problem Relation Age of Onset  . CAD Mother 55  . CAD Father 44  . CAD Sister   . CAD Brother     Home Medications:   Prior to Admission medications   Medication Sig Start Date End Date Taking? Authorizing Provider  acetaminophen (TYLENOL) 325 MG tablet Take 325 mg by mouth every 8 (eight) hours as needed for moderate pain.     [provider]  atorvastatin (LIPITOR) 10 MG tablet Take 10 mg by mouth.  09/14/18 09/14/19  [provider]  fluticasone (FLONASE) 50 MCG/ACT nasal spray daily as needed.  10/08/14   [provider]  furosemide (LASIX) 20 MG tablet Take 20 mg by mouth daily.  10/13/17   [provider]  loratadine (CLARITIN) 10 MG tablet Take 10 mg by mouth daily.    [provider]  metoprolol tartrate (LOPRESSOR) 50 MG tablet Take 1 tablet (50 mg total) by mouth 2 (two) times daily. 10/03/18 02/06/19  Strader, Fransisco Hertz, PA-C  pantoprazole (PROTONIX) 40 MG tablet Take 1 tablet (40 mg total) by mouth daily. 09/14/18   Elgergawy, Silver Huguenin, MD  Pediatric Multivitamins-Iron (FLINTSTONES PLUS IRON) chewable tablet Chew 1 tablet by mouth 2 (two) times daily. 08/25/17   Rogene Houston, MD  rivaroxaban (XARELTO) 20 MG TABS tablet Take 1 tablet (20 mg total) by mouth daily with supper. 08/28/17   Rogene Houston, MD  tamsulosin (FLOMAX) 0.4 MG CAPS capsule Take by mouth. 01/15/19   [provider]     Allergies:    No Known Allergies   Physical Exam:   Vitals  Blood pressure (!) 121/93, pulse (!) 132, temperature 98.4 F (36.9 C), temperature source Oral, resp. rate 18, height 5\' 8"  (1.727 m), weight 89.3 kg, SpO2 94 %.  Physical Examination: General appearance - alert, well appearing, and in no distress  Nose- [place] 2L/min Mental  status - alert, oriented to person, place, and time,  Eyes - sclera anicteric Neck - supple, no JVD elevation , Chest - clear  to auscultation bilaterally, symmetrical air movement,  Heart - S1 and S2 normal, irregularly irregular, heart rate 130s Abdomen - soft, nontender, nondistended, no masses or organomegaly Neurological - screening mental status exam normal, neck supple without rigidity, cranial nerves II through XII intact, DTR's  normal and symmetric Extremities - no pedal edema noted, intact peripheral pulses  Skin - warm, dry    Data Review:    CBC Recent Labs  Lab 03/13/19 1056  WBC 6.5  HGB 11.3*  HCT 37.1*  PLT 187  MCV 95.6  MCH 29.1  MCHC 30.5  RDW 14.4  LYMPHSABS 0.6*  MONOABS 0.5  EOSABS 0.1  BASOSABS 0.0   ------------------------------------------------------------------------------------------------------------------  Chemistries  Recent Labs  Lab 03/13/19 1056 03/13/19 1251  NA 138  --   K 3.9  --   CL 102  --   CO2 26  --   GLUCOSE 208*  --   BUN 14  --   CREATININE 0.96  --   CALCIUM 8.6*  --   MG  --  2.1   ------------------------------------------------------------------------------------------------------------------ estimated creatinine clearance is 57.8 mL/min (by C-G formula based on SCr of 0.96 mg/dL). ------------------------------------------------------------------------------------------------------------------ Recent Labs    03/13/19 1251  TSH 3.683     Coagulation profile No results for input(s): INR, PROTIME in the last 168 hours. ------------------------------------------------------------------------------------------------------------------- No results for input(s): DDIMER in the last 72 hours. -------------------------------------------------------------------------------------------------------------------  Cardiac Enzymes No results for input(s): CKMB, TROPONINI, MYOGLOBIN in the last 168 hours.  Invalid  input(s): CK ------------------------------------------------------------------------------------------------------------------    Component Value Date/Time   BNP 112.0 (H) 09/12/2018 0942   ---------------------------------------------------------------------------------------------------------------  Urinalysis    Component Value Date/Time   COLORURINE YELLOW 06/14/2016 1019   APPEARANCEUR CLEAR 06/14/2016 1019   LABSPEC 1.017 06/14/2016 1019   PHURINE 6.0 06/14/2016 1019   GLUCOSEU NEGATIVE 06/14/2016 1019   HGBUR NEGATIVE 06/14/2016 1019   BILIRUBINUR NEGATIVE 06/14/2016 1019   KETONESUR NEGATIVE 06/14/2016 1019   PROTEINUR NEGATIVE 06/14/2016 1019   NITRITE NEGATIVE 06/14/2016 1019   LEUKOCYTESUR NEGATIVE 06/14/2016 1019    ----------------------------------------------------------------------------------------------------------------   Imaging Results:    DG Chest Port 1 View  Result Date: 03/13/2019 CLINICAL DATA:  Cough, elevated heart rate, history of atrial fibrillation. EXAM: PORTABLE CHEST 1 VIEW COMPARISON:  09/12/2018 FINDINGS: Portable exam with lordotic positioning. Heart size is stable. Leads and pacer defibrillator pads project over the central chest. Mild interstitial prominence is not significantly changed from the study of 09/12/2018. No signs of dense consolidation or evidence of pleural effusion. Visualized skeletal structures are unremarkable. IMPRESSION: Stable mild interstitial prominence, no consolidation or effusion. Multiple leads project over the patient's chest. Electronically Signed   By: Zetta Bills M.D.   On: 03/13/2019 11:09   ECHOCARDIOGRAM COMPLETE  Result Date: 03/13/2019   ECHOCARDIOGRAM REPORT   Patient Name:   KILLIAN SCHWER Date of Exam: 03/13/2019 Medical Rec #:  [id number]    Height:       68.0 in Accession #:    [id number]   Weight:       203.0 lb Date of Birth:  1928-09-28   BSA:          2.06 m Patient Age:    64 years     BP:            103/63 mmHg Patient Gender: M            HR:           105 bpm. Exam Location:  [facility] Procedure: 2D Echo Indications:    Abnormal ECG 794.31 / R94.31  History:        Patient has prior history of Echocardiogram examinations, most  recent 12/29/2017. CAD, Signs/Symptoms:Chest Pain and Dyspnea;                 Risk Factors:Diabetes, Hypertension and Former Smoker. Atrial                 fibrillation with rapid ventricular response , Non Hodgkin's                 lymphoma , Chronic diastolic heart failure.  Sonographer:    Leavy Cella RDCS (AE) Referring Phys: [id number] Keyra Virella IMPRESSIONS  1. Left ventricular ejection fraction, by visual estimation, is 30 to 35%. The left ventricle has moderate to severely decreased function. There is mildly increased left ventricular hypertrophy.  2. Left ventricular diastolic parameters are indeterminate in the setting of atrial fibrillation.  3. The left ventricle demonstrates global hypokinesis with paradoxical septal motion.  4. Global right ventricle has mildly reduced systolic function.The right ventricular size is normal. No increase in right ventricular wall thickness.  5. Left atrial size was upper normal.  6. Right atrial size was normal.  7. Mild mitral annular calcification.  8. The mitral valve is grossly normal. Trivial mitral valve regurgitation.  9. The tricuspid valve is grossly normal. Tricuspid valve regurgitation is trivial. 10. The aortic valve is tricuspid. Aortic valve regurgitation is not visualized. Mild aortic valve sclerosis without stenosis. 11. The pulmonic valve was grossly normal. Pulmonic valve regurgitation is trivial. 12. TR signal is inadequate for assessing pulmonary artery systolic pressure. 13. The inferior vena cava is dilated in size with <50% respiratory variability, suggesting right atrial pressure of 15 mmHg. FINDINGS  Left Ventricle: Left ventricular ejection fraction, by visual estimation, is 30 to 35%. The  left ventricle has moderate to severely decreased function. The left ventricle demonstrates global hypokinesis. The left ventricular internal cavity size was the  left ventricle is normal in size. There is mildly increased left ventricular hypertrophy. Left ventricular diastolic parameters are indeterminate. Right Ventricle: The right ventricular size is normal. No increase in right ventricular wall thickness. Global RV systolic function is has mildly reduced systolic function. Left Atrium: Left atrial size was upper normal. Right Atrium: Right atrial size was normal in size Pericardium: There is no evidence of pericardial effusion. Mitral Valve: The mitral valve is grossly normal. Mild mitral annular calcification. Trivial mitral valve regurgitation. Tricuspid Valve: The tricuspid valve is grossly normal. Tricuspid valve regurgitation is trivial. Aortic Valve: The aortic valve is tricuspid. Aortic valve regurgitation is not visualized. Mild aortic valve sclerosis is present, with no evidence of aortic valve stenosis. Mild aortic valve annular calcification. Pulmonic Valve: The pulmonic valve was grossly normal. Pulmonic valve regurgitation is trivial. Pulmonic regurgitation is trivial. Aorta: The aortic root is normal in size and structure. Venous: The inferior vena cava is dilated in size with less than 50% respiratory variability, suggesting right atrial pressure of 15 mmHg. IAS/Shunts: No atrial level shunt detected by color flow Doppler.  LEFT VENTRICLE PLAX 2D LVIDd:         4.61 cm  Diastology LVIDs:         3.70 cm  LV e' lateral:   8.27 cm/s LV PW:         1.05 cm  LV E/e' lateral: 12.5 LV IVS:        1.36 cm  LV e' medial:    6.53 cm/s LVOT diam:     1.90 cm  LV E/e' medial:  15.8 LV SV:  40 ml LV SV Index:   18.65 LVOT Area:     2.84 cm  RIGHT VENTRICLE RV S prime:     7.62 cm/s TAPSE (M-mode): 1.4 cm LEFT ATRIUM             Index       RIGHT ATRIUM           Index LA diam:        3.70 cm 1.80  cm/m  RA Area:     14.40 cm LA Vol (A2C):   52.0 ml 25.28 ml/m RA Volume:   38.40 ml  18.67 ml/m LA Vol (A4C):   67.6 ml 32.87 ml/m LA Biplane Vol: 60.5 ml 29.42 ml/m   AORTA Ao Root diam: 2.80 cm MITRAL VALVE MV Area (PHT): 3.77 cm              SHUNTS MV PHT:        58.29 msec            Systemic Diam: 1.90 cm MV Decel Time: 201 msec MV E velocity: 103.00 cm/s 103 cm/s MV A velocity: 32.50 cm/s  70.3 cm/s MV E/A ratio:  3.17        1.5  Rozann Lesches MD Electronically signed by Rozann Lesches MD Signature Date/Time: 03/13/2019/4:28:01 PM    Final     Radiological Exams on Admission: DG Chest Port 1 View  Result Date: 03/13/2019 CLINICAL DATA:  Cough, elevated heart rate, history of atrial fibrillation. EXAM: PORTABLE CHEST 1 VIEW COMPARISON:  09/12/2018 FINDINGS: Portable exam with lordotic positioning. Heart size is stable. Leads and pacer defibrillator pads project over the central chest. Mild interstitial prominence is not significantly changed from the study of 09/12/2018. No signs of dense consolidation or evidence of pleural effusion. Visualized skeletal structures are unremarkable. IMPRESSION: Stable mild interstitial prominence, no consolidation or effusion. Multiple leads project over the patient's chest. Electronically Signed   By: Zetta Bills M.D.   On: 03/13/2019 11:09   ECHOCARDIOGRAM COMPLETE  Result Date: 03/13/2019   ECHOCARDIOGRAM REPORT   Patient Name:   BRAYSON LIVESEY Date of Exam: 03/13/2019 Medical Rec #:  [id number]    Height:       68.0 in Accession #:    [id number]   Weight:       203.0 lb Date of Birth:  Dec 13, 1928   BSA:          2.06 m Patient Age:    9 years     BP:           103/63 mmHg Patient Gender: M            HR:           105 bpm. Exam Location:  [facility] Procedure: 2D Echo Indications:    Abnormal ECG 794.31 / R94.31  History:        Patient has prior history of Echocardiogram examinations, most                 recent 12/29/2017. CAD, Signs/Symptoms:Chest  Pain and Dyspnea;                 Risk Factors:Diabetes, Hypertension and Former Smoker. Atrial                 fibrillation with rapid ventricular response , Non Hodgkin's                 lymphoma , Chronic diastolic heart failure.  Sonographer:  Leavy Cella RDCS (AE) Referring Phys: [id number] Chinyere Galiano IMPRESSIONS  1. Left ventricular ejection fraction, by visual estimation, is 30 to 35%. The left ventricle has moderate to severely decreased function. There is mildly increased left ventricular hypertrophy.  2. Left ventricular diastolic parameters are indeterminate in the setting of atrial fibrillation.  3. The left ventricle demonstrates global hypokinesis with paradoxical septal motion.  4. Global right ventricle has mildly reduced systolic function.The right ventricular size is normal. No increase in right ventricular wall thickness.  5. Left atrial size was upper normal.  6. Right atrial size was normal.  7. Mild mitral annular calcification.  8. The mitral valve is grossly normal. Trivial mitral valve regurgitation.  9. The tricuspid valve is grossly normal. Tricuspid valve regurgitation is trivial. 10. The aortic valve is tricuspid. Aortic valve regurgitation is not visualized. Mild aortic valve sclerosis without stenosis. 11. The pulmonic valve was grossly normal. Pulmonic valve regurgitation is trivial. 12. TR signal is inadequate for assessing pulmonary artery systolic pressure. 13. The inferior vena cava is dilated in size with <50% respiratory variability, suggesting right atrial pressure of 15 mmHg. FINDINGS  Left Ventricle: Left ventricular ejection fraction, by visual estimation, is 30 to 35%. The left ventricle has moderate to severely decreased function. The left ventricle demonstrates global hypokinesis. The left ventricular internal cavity size was the  left ventricle is normal in size. There is mildly increased left ventricular hypertrophy. Left ventricular diastolic parameters are  indeterminate. Right Ventricle: The right ventricular size is normal. No increase in right ventricular wall thickness. Global RV systolic function is has mildly reduced systolic function. Left Atrium: Left atrial size was upper normal. Right Atrium: Right atrial size was normal in size Pericardium: There is no evidence of pericardial effusion. Mitral Valve: The mitral valve is grossly normal. Mild mitral annular calcification. Trivial mitral valve regurgitation. Tricuspid Valve: The tricuspid valve is grossly normal. Tricuspid valve regurgitation is trivial. Aortic Valve: The aortic valve is tricuspid. Aortic valve regurgitation is not visualized. Mild aortic valve sclerosis is present, with no evidence of aortic valve stenosis. Mild aortic valve annular calcification. Pulmonic Valve: The pulmonic valve was grossly normal. Pulmonic valve regurgitation is trivial. Pulmonic regurgitation is trivial. Aorta: The aortic root is normal in size and structure. Venous: The inferior vena cava is dilated in size with less than 50% respiratory variability, suggesting right atrial pressure of 15 mmHg. IAS/Shunts: No atrial level shunt detected by color flow Doppler.  LEFT VENTRICLE PLAX 2D LVIDd:         4.61 cm  Diastology LVIDs:         3.70 cm  LV e' lateral:   8.27 cm/s LV PW:         1.05 cm  LV E/e' lateral: 12.5 LV IVS:        1.36 cm  LV e' medial:    6.53 cm/s LVOT diam:     1.90 cm  LV E/e' medial:  15.8 LV SV:         40 ml LV SV Index:   18.65 LVOT Area:     2.84 cm  RIGHT VENTRICLE RV S prime:     7.62 cm/s TAPSE (M-mode): 1.4 cm LEFT ATRIUM             Index       RIGHT ATRIUM           Index LA diam:        3.70 cm 1.80 cm/m  RA Area:     14.40 cm LA Vol (A2C):   52.0 ml 25.28 ml/m RA Volume:   38.40 ml  18.67 ml/m LA Vol (A4C):   67.6 ml 32.87 ml/m LA Biplane Vol: 60.5 ml 29.42 ml/m   AORTA Ao Root diam: 2.80 cm MITRAL VALVE MV Area (PHT): 3.77 cm              SHUNTS MV PHT:        58.29 msec             Systemic Diam: 1.90 cm MV Decel Time: 201 msec MV E velocity: 103.00 cm/s 103 cm/s MV A velocity: 32.50 cm/s  70.3 cm/s MV E/A ratio:  3.17        1.5  Rozann Lesches MD Electronically signed by Rozann Lesches MD Signature Date/Time: 03/13/2019/4:28:01 PM    Final    DVT Prophylaxis -SCD Alveda Reasons AM Labs Ordered, also please review Full Orders  Family Communication: Admission, patients condition and plan of care including tests being ordered have been discussed with the patient and who indicate understanding and agree with the plan   Code Status - Full Code  Likely DC to home  Condition   stable,  Roxan Hockey M.D on 03/13/2019 at 8:05 PM Go to [email or web address] -  for contact info  [facility] - Office  [phone]

## 2019-03-13 NOTE — ED Triage Notes (Signed)
Patient arrived via [facility] due to increased heart rate beginning at 0900 this morning.  Patient has history of Afib.  Patient was given 6mg  of Adenosine with no results of heart rate 200.  Patient was given 20mg  Cardizem with results of heart rate down to 150.  Patient is alert, oriented.

## 2019-03-13 NOTE — Progress Notes (Signed)
*  PRELIMINARY RESULTS* Echocardiogram 2D Echocardiogram has been performed.  Jesse Murphy 03/13/2019, 2:51 PM

## 2019-03-13 NOTE — ED Notes (Signed)
200 Joules delivered synchronized. PT remained in afib after attempted Cardioversion and new order for Metroprolol 5mg  via given verbally at this time.

## 2019-03-14 DIAGNOSIS — Z9981 Dependence on supplemental oxygen: Secondary | ICD-10-CM | POA: Diagnosis not present

## 2019-03-14 DIAGNOSIS — Z515 Encounter for palliative care: Secondary | ICD-10-CM | POA: Diagnosis not present

## 2019-03-14 DIAGNOSIS — E440 Moderate protein-calorie malnutrition: Secondary | ICD-10-CM | POA: Diagnosis present

## 2019-03-14 DIAGNOSIS — N3041 Irradiation cystitis with hematuria: Secondary | ICD-10-CM | POA: Diagnosis present

## 2019-03-14 DIAGNOSIS — J90 Pleural effusion, not elsewhere classified: Secondary | ICD-10-CM | POA: Diagnosis not present

## 2019-03-14 DIAGNOSIS — K627 Radiation proctitis: Secondary | ICD-10-CM | POA: Diagnosis present

## 2019-03-14 DIAGNOSIS — C8590 Non-Hodgkin lymphoma, unspecified, unspecified site: Secondary | ICD-10-CM | POA: Diagnosis present

## 2019-03-14 DIAGNOSIS — R627 Adult failure to thrive: Secondary | ICD-10-CM | POA: Diagnosis not present

## 2019-03-14 DIAGNOSIS — I482 Chronic atrial fibrillation, unspecified: Secondary | ICD-10-CM | POA: Diagnosis present

## 2019-03-14 DIAGNOSIS — I5043 Acute on chronic combined systolic (congestive) and diastolic (congestive) heart failure: Secondary | ICD-10-CM | POA: Diagnosis not present

## 2019-03-14 DIAGNOSIS — N029 Recurrent and persistent hematuria with unspecified morphologic changes: Secondary | ICD-10-CM | POA: Diagnosis not present

## 2019-03-14 DIAGNOSIS — I2583 Coronary atherosclerosis due to lipid rich plaque: Secondary | ICD-10-CM | POA: Diagnosis not present

## 2019-03-14 DIAGNOSIS — L239 Allergic contact dermatitis, unspecified cause: Secondary | ICD-10-CM | POA: Diagnosis not present

## 2019-03-14 DIAGNOSIS — J9601 Acute respiratory failure with hypoxia: Secondary | ICD-10-CM | POA: Diagnosis not present

## 2019-03-14 DIAGNOSIS — R000 Tachycardia, unspecified: Secondary | ICD-10-CM | POA: Diagnosis present

## 2019-03-14 DIAGNOSIS — Z951 Presence of aortocoronary bypass graft: Secondary | ICD-10-CM | POA: Diagnosis not present

## 2019-03-14 DIAGNOSIS — N2581 Secondary hyperparathyroidism of renal origin: Secondary | ICD-10-CM | POA: Diagnosis not present

## 2019-03-14 DIAGNOSIS — R7303 Prediabetes: Secondary | ICD-10-CM | POA: Diagnosis present

## 2019-03-14 DIAGNOSIS — R34 Anuria and oliguria: Secondary | ICD-10-CM | POA: Diagnosis present

## 2019-03-14 DIAGNOSIS — T82818A Embolism of vascular prosthetic devices, implants and grafts, initial encounter: Secondary | ICD-10-CM | POA: Diagnosis not present

## 2019-03-14 DIAGNOSIS — I82612 Acute embolism and thrombosis of superficial veins of left upper extremity: Secondary | ICD-10-CM | POA: Diagnosis not present

## 2019-03-14 DIAGNOSIS — I132 Hypertensive heart and chronic kidney disease with heart failure and with stage 5 chronic kidney disease, or end stage renal disease: Secondary | ICD-10-CM | POA: Diagnosis not present

## 2019-03-14 DIAGNOSIS — J151 Pneumonia due to Pseudomonas: Secondary | ICD-10-CM | POA: Diagnosis not present

## 2019-03-14 DIAGNOSIS — I5021 Acute systolic (congestive) heart failure: Secondary | ICD-10-CM | POA: Diagnosis present

## 2019-03-14 DIAGNOSIS — T8249XA Other complication of vascular dialysis catheter, initial encounter: Secondary | ICD-10-CM | POA: Diagnosis not present

## 2019-03-14 DIAGNOSIS — I214 Non-ST elevation (NSTEMI) myocardial infarction: Secondary | ICD-10-CM | POA: Diagnosis not present

## 2019-03-14 DIAGNOSIS — R1310 Dysphagia, unspecified: Secondary | ICD-10-CM | POA: Diagnosis present

## 2019-03-14 DIAGNOSIS — R310 Gross hematuria: Secondary | ICD-10-CM | POA: Diagnosis present

## 2019-03-14 DIAGNOSIS — Z8572 Personal history of non-Hodgkin lymphomas: Secondary | ICD-10-CM | POA: Diagnosis not present

## 2019-03-14 DIAGNOSIS — K921 Melena: Secondary | ICD-10-CM | POA: Diagnosis not present

## 2019-03-14 DIAGNOSIS — Z930 Tracheostomy status: Secondary | ICD-10-CM | POA: Diagnosis not present

## 2019-03-14 DIAGNOSIS — R112 Nausea with vomiting, unspecified: Secondary | ICD-10-CM | POA: Diagnosis not present

## 2019-03-14 DIAGNOSIS — E875 Hyperkalemia: Secondary | ICD-10-CM | POA: Diagnosis present

## 2019-03-14 DIAGNOSIS — I4891 Unspecified atrial fibrillation: Secondary | ICD-10-CM | POA: Diagnosis present

## 2019-03-14 DIAGNOSIS — E8809 Other disorders of plasma-protein metabolism, not elsewhere classified: Secondary | ICD-10-CM | POA: Diagnosis present

## 2019-03-14 DIAGNOSIS — I495 Sick sinus syndrome: Secondary | ICD-10-CM | POA: Diagnosis not present

## 2019-03-14 DIAGNOSIS — D631 Anemia in chronic kidney disease: Secondary | ICD-10-CM | POA: Diagnosis not present

## 2019-03-14 DIAGNOSIS — Z992 Dependence on renal dialysis: Secondary | ICD-10-CM | POA: Diagnosis not present

## 2019-03-14 DIAGNOSIS — D649 Anemia, unspecified: Secondary | ICD-10-CM | POA: Diagnosis not present

## 2019-03-14 DIAGNOSIS — Z66 Do not resuscitate: Secondary | ICD-10-CM | POA: Diagnosis present

## 2019-03-14 DIAGNOSIS — Z01810 Encounter for preprocedural cardiovascular examination: Secondary | ICD-10-CM | POA: Diagnosis not present

## 2019-03-14 DIAGNOSIS — Y95 Nosocomial condition: Secondary | ICD-10-CM | POA: Diagnosis not present

## 2019-03-14 DIAGNOSIS — J918 Pleural effusion in other conditions classified elsewhere: Secondary | ICD-10-CM | POA: Diagnosis not present

## 2019-03-14 DIAGNOSIS — I25110 Atherosclerotic heart disease of native coronary artery with unstable angina pectoris: Secondary | ICD-10-CM | POA: Diagnosis not present

## 2019-03-14 DIAGNOSIS — N189 Chronic kidney disease, unspecified: Secondary | ICD-10-CM | POA: Diagnosis not present

## 2019-03-14 DIAGNOSIS — N179 Acute kidney failure, unspecified: Secondary | ICD-10-CM | POA: Diagnosis not present

## 2019-03-14 DIAGNOSIS — E46 Unspecified protein-calorie malnutrition: Secondary | ICD-10-CM | POA: Diagnosis not present

## 2019-03-14 DIAGNOSIS — R5381 Other malaise: Secondary | ICD-10-CM | POA: Diagnosis present

## 2019-03-14 DIAGNOSIS — I480 Paroxysmal atrial fibrillation: Secondary | ICD-10-CM | POA: Diagnosis present

## 2019-03-14 DIAGNOSIS — I2510 Atherosclerotic heart disease of native coronary artery without angina pectoris: Secondary | ICD-10-CM | POA: Diagnosis present

## 2019-03-14 DIAGNOSIS — N186 End stage renal disease: Secondary | ICD-10-CM | POA: Diagnosis present

## 2019-03-14 DIAGNOSIS — I255 Ischemic cardiomyopathy: Secondary | ICD-10-CM | POA: Diagnosis not present

## 2019-03-14 DIAGNOSIS — R570 Cardiogenic shock: Secondary | ICD-10-CM | POA: Diagnosis not present

## 2019-03-14 DIAGNOSIS — M7989 Other specified soft tissue disorders: Secondary | ICD-10-CM | POA: Diagnosis not present

## 2019-03-14 DIAGNOSIS — I10 Essential (primary) hypertension: Secondary | ICD-10-CM | POA: Diagnosis not present

## 2019-03-14 DIAGNOSIS — Y842 Radiological procedure and radiotherapy as the cause of abnormal reaction of the patient, or of later complication, without mention of misadventure at the time of the procedure: Secondary | ICD-10-CM | POA: Diagnosis present

## 2019-03-14 DIAGNOSIS — K5521 Angiodysplasia of colon with hemorrhage: Secondary | ICD-10-CM | POA: Diagnosis not present

## 2019-03-14 DIAGNOSIS — Z7901 Long term (current) use of anticoagulants: Secondary | ICD-10-CM | POA: Diagnosis not present

## 2019-03-14 DIAGNOSIS — E871 Hypo-osmolality and hyponatremia: Secondary | ICD-10-CM | POA: Diagnosis present

## 2019-03-14 DIAGNOSIS — Z20822 Contact with and (suspected) exposure to covid-19: Secondary | ICD-10-CM | POA: Diagnosis present

## 2019-03-14 DIAGNOSIS — L270 Generalized skin eruption due to drugs and medicaments taken internally: Secondary | ICD-10-CM | POA: Diagnosis not present

## 2019-03-14 DIAGNOSIS — G9341 Metabolic encephalopathy: Secondary | ICD-10-CM | POA: Diagnosis not present

## 2019-03-14 DIAGNOSIS — D62 Acute posthemorrhagic anemia: Secondary | ICD-10-CM | POA: Diagnosis not present

## 2019-03-14 DIAGNOSIS — D696 Thrombocytopenia, unspecified: Secondary | ICD-10-CM | POA: Diagnosis present

## 2019-03-14 DIAGNOSIS — I429 Cardiomyopathy, unspecified: Secondary | ICD-10-CM

## 2019-03-14 DIAGNOSIS — Z9689 Presence of other specified functional implants: Secondary | ICD-10-CM | POA: Diagnosis not present

## 2019-03-14 DIAGNOSIS — E874 Mixed disorder of acid-base balance: Secondary | ICD-10-CM | POA: Diagnosis not present

## 2019-03-14 DIAGNOSIS — R1312 Dysphagia, oropharyngeal phase: Secondary | ICD-10-CM | POA: Diagnosis not present

## 2019-03-14 DIAGNOSIS — N170 Acute kidney failure with tubular necrosis: Secondary | ICD-10-CM | POA: Diagnosis present

## 2019-03-14 DIAGNOSIS — I4819 Other persistent atrial fibrillation: Secondary | ICD-10-CM | POA: Diagnosis not present

## 2019-03-14 DIAGNOSIS — Y848 Other medical procedures as the cause of abnormal reaction of the patient, or of later complication, without mention of misadventure at the time of the procedure: Secondary | ICD-10-CM | POA: Diagnosis not present

## 2019-03-14 DIAGNOSIS — R42 Dizziness and giddiness: Secondary | ICD-10-CM | POA: Diagnosis not present

## 2019-03-14 LAB — BASIC METABOLIC PANEL
Anion gap: 9 (ref 5–15)
BUN: 12 mg/dL (ref 8–23)
CO2: 29 mmol/L (ref 22–32)
Calcium: 8.7 mg/dL — ABNORMAL LOW (ref 8.9–10.3)
Chloride: 101 mmol/L (ref 98–111)
Creatinine, Ser: 1.03 mg/dL (ref 0.61–1.24)
GFR calc Af Amer: >60 mL/min (ref >60)
GFR calc non Af Amer: >60 mL/min (ref >60)
Glucose, Bld: 115 mg/dL — ABNORMAL HIGH (ref 70–99)
Potassium: 3.7 mmol/L (ref 3.5–5.1)
Sodium: 139 mmol/L (ref 135–145)

## 2019-03-14 LAB — CBC
HCT: 34.4 % — ABNORMAL LOW (ref 39.0–52.0)
Hemoglobin: 10.5 g/dL — ABNORMAL LOW (ref 13.0–17.0)
MCH: 29.1 pg (ref 26.0–34.0)
MCHC: 30.5 g/dL (ref 30.0–36.0)
MCV: 95.3 fL (ref 80.0–100.0)
Platelets: 194 10*3/uL (ref 150–400)
RBC: 3.61 MIL/uL — ABNORMAL LOW (ref 4.22–5.81)
RDW: 14.4 % (ref 11.5–15.5)
WBC: 6.8 10*3/uL (ref 4.0–10.5)
nRBC: 0 % (ref 0.0–0.2)

## 2019-03-14 LAB — GLUCOSE, CAPILLARY
Glucose-Capillary: 118 mg/dL — ABNORMAL HIGH (ref 70–99)
Glucose-Capillary: 145 mg/dL — ABNORMAL HIGH (ref 70–99)
Glucose-Capillary: 111 mg/dL — ABNORMAL HIGH (ref 70–99)
Glucose-Capillary: 146 mg/dL — ABNORMAL HIGH (ref 70–99)

## 2019-03-14 LAB — TROPONIN I (HIGH SENSITIVITY)
Troponin I (High Sensitivity): 926 ng/L (ref <18)
Troponin I (High Sensitivity): 585 ng/L (ref <18)

## 2019-03-14 LAB — MRSA PCR SCREENING: MRSA by PCR: NEGATIVE

## 2019-03-14 LAB — APTT: aPTT: 48 seconds — ABNORMAL HIGH (ref 24–36)

## 2019-03-14 LAB — HEPARIN LEVEL (UNFRACTIONATED): Heparin Unfractionated: >2.2 IU/mL — ABNORMAL HIGH (ref 0.30–0.70)

## 2019-03-14 MED ORDER — SODIUM CHLORIDE 0.9 % IV SOLN: 250.0000 mL | INTRAVENOUS | Status: DC | PRN

## 2019-03-14 MED ORDER — ASPIRIN 81 MG PO CHEW
81.0000 mg | CHEWABLE_TABLET | ORAL | Status: AC
  Administered 2019-03-15: 81 mg via ORAL
  Filled 2019-03-14: qty 1

## 2019-03-14 MED ORDER — SODIUM CHLORIDE 0.9% FLUSH
3.0000 mL | Freq: Two times a day (BID) | INTRAVENOUS | Status: DC
  Administered 2019-03-14: 3 mL via INTRAVENOUS

## 2019-03-14 MED ORDER — ATORVASTATIN CALCIUM 40 MG PO TABS
40.0000 mg | ORAL_TABLET | Freq: Every day | ORAL | Status: DC
  Administered 2019-03-14: 40 mg via ORAL
  Filled 2019-03-14: qty 1

## 2019-03-14 MED ORDER — SODIUM CHLORIDE 0.9 % IV SOLN: INTRAVENOUS | Status: DC

## 2019-03-14 MED ORDER — SODIUM CHLORIDE 0.9% FLUSH: 3.0000 mL | INTRAVENOUS | Status: DC | PRN

## 2019-03-14 MED ORDER — HEPARIN (PORCINE) 25000 UT/250ML-% IV SOLN
1200.0000 [IU]/h | INTRAVENOUS | Status: DC
  Administered 2019-03-14: 1200 [IU]/h via INTRAVENOUS
  Filled 2019-03-14: qty 250

## 2019-03-14 MED ORDER — POTASSIUM CHLORIDE CRYS ER 20 MEQ PO TBCR
40.0000 meq | EXTENDED_RELEASE_TABLET | Freq: Once | ORAL | Status: AC
  Administered 2019-03-14: 40 meq via ORAL
  Filled 2019-03-14: qty 2

## 2019-03-14 MED ORDER — METOPROLOL TARTRATE 5 MG/5ML IV SOLN
5.0000 mg | Freq: Four times a day (QID) | INTRAVENOUS | Status: DC | PRN
  Administered 2019-03-14 – 2019-03-15 (×4): 5 mg via INTRAVENOUS
  Filled 2019-03-14 (×3): qty 5

## 2019-03-14 NOTE — Progress Notes (Signed)
Patient Demographics:    Jesse Murphy, is a 85 y.o. male, DOB - 25-Jan-1929, DQQ:[id number]  Admit date - 03/13/2019   Admitting Physician Aeriana Speece Denton Brick, MD  Outpatient Primary MD for the patient is Sandi Mealy, MD  LOS - 0   Chief Complaint  Patient presents with  . Tachycardia        Subjective:    Jesse Murphy today has no fevers, no emesis, retrosternal chest discomfort, palpitations and dizziness, as well as dyspnea on exertion  Assessment  & Plan :    Principal Problem:   Atrial fibrillation with rapid ventricular response (HCC) Active Problems:   CAD S/P percutaneous coronary angioplasty   GERD (gastroesophageal reflux disease)   Chronic anticoagulation   Type 2 diabetes mellitus without complications (HCC)   Non Hodgkin's lymphoma (HCC)   Chronic diastolic heart failure ([place])   Hypertension  Brief Summary:- for chronic atrial fibrillation on chronic anticoagulation with Xarelto as well as history chronic anemia, HTN, GERD, DM and diastolic CHF, CAD (s/p PCI in 1992, significant CAD by Coronary CT in 01/2018 --> catheterization not pursued at that time given no recent symptoms), follicular (non-Hodgkin's) lymphoma and prostate cancer admitted on 03/13/2019 with A. fib with RVR---DCCV was attempted on admission on 03/13/2019 at 100 J and 200 J in the ED but he remained in atrial fibrillation -Patient also with chest discomfort and elevated troponin with drop in EF -Transferred to cardiology service at [place] for [facility] and [place] especially given drop in EF --Hospitalist service will sign off at this time   A/p 1) chronic A. fib with RVR-- on presentation/admission-EMS gave IV adenosine heart rate slowed down to 150s in the ED patient received IV Cardizem remains tachycardic -DCCV was attempted on admission on 03/13/2019 at 100 J and 200 J in the ED but he remained in atrial  fibrillation --TSH 3.68, magnesium is 2.1, potassium is 3.9 -Repeat Echo with EF down to 30 to 35% with global hypokinesis and paradoxical septal motion (EF was 55 to 60% on 12/29/2017) -Avoid Cardizem for rate control given drop in EF -Continue metoprolol for rate control -May need IV amiodarone and further cardioversion -Cardiology consult appreciated, stop Xarelto and start IV heparin in anticipation of LHC  2)CAD (s/p PCI in 1992, significant CAD by Coronary CT in 01/2018 -troponin peaked at 926 in the setting of A. fib with RVR, --???  Demand ischemia in the setting of persistent tachycardia,  -Patient having some chest discomfort -Echo with reduced EF compared to prior--please see #1 above -Cardiology input appreciated -Continue aspirin, Lipitor ,metoprolol -Stop Xarelto and transition to IV heparin in anticipation of RHC and LHC  3)DM2-history of diet-controlled diabetes, A1c 6.1 reflecting excellent diabetic control, Use Novolog/Humalog Sliding scale insulin with Accu-Cheks/Fingersticks as ordered   4) history of follicular (non-Hodgkin's) lymphoma and chronic anemia--- patient follows with Dr. Delton Coombes -Globin stable at 11.3  5)HFrEF--- Repeat Echo with EF down to 30 to 35% with global hypokinesis and paradoxical septal motion (EF was 55 to 60% on 12/29/2017)----LHC and RHC pending -Does not appear particularly volume overloaded at this time -Defer to cardiology service  Disposition/Need for in-Hospital Stay- patient unable to be discharged at this time due to --Transferred to cardiology service at  [place] [facility] for [facility] and [place] especially given drop in EF -Hospitalist service will sign off at this time   Code Status : Full code Family Communication:   NA (patient is alert, awake and coherent)  Disposition Plan  : [facility] for [facility]  Consults  : Cardiology DVT Prophylaxis  : IV heparin  Lab Results  Component Value Date   PLT 194 03/14/2019    Inpatient  Medications  Scheduled Meds: . aspirin EC  81 mg Oral Daily  . atorvastatin  10 mg Oral q1800  . Chlorhexidine Gluconate Cloth  6 each Topical Daily  . folic acid  1 mg Oral Daily  . furosemide  20 mg Oral Daily  . insulin aspart  0-5 Units Subcutaneous QHS  . insulin aspart  0-9 Units Subcutaneous TID WC  . loratadine  10 mg Oral Daily  . metoprolol tartrate  50 mg Oral Q8H  . multivitamin with minerals  1 tablet Oral Daily  . pantoprazole  40 mg Oral Daily  . sodium chloride flush  3 mL Intravenous Q12H  . tamsulosin  0.4 mg Oral QPC supper  . thiamine  100 mg Oral Daily   Continuous Infusions: . sodium chloride    . heparin     PRN Meds:.sodium chloride, acetaminophen **OR** acetaminophen, metoprolol tartrate, ondansetron **OR** ondansetron (ZOFRAN) IV, polyethylene glycol, sodium chloride flush, traZODone    Anti-infectives (From admission, onward)   None        Objective:   Vitals:   03/14/19 0616 03/14/19 0728 03/14/19 0730 03/14/19 1000  BP: 108/69  112/75 111/70  Pulse: (!) 103  (!) 109 (!) 106  Resp:   (!) 23 (!) 25  Temp:  97.8 F (36.6 C)    TempSrc:  Oral    SpO2:   95% 95%  Weight: 89.2 kg     Height:        Wt Readings from Last 3 Encounters:  03/14/19 89.2 kg  02/06/19 91.4 kg  01/01/19 92.1 kg     Intake/Output Summary (Last 24 hours) at 03/14/2019 1054 Last data filed at 03/13/2019 2121 Gross per 24 hour  Intake --  Output 575 ml  Net -575 ml   Physical Exam  Gen:- Awake Alert,  , no conversational dyspnea HEENT:- Elim.AT, No sclera icterus Neck-Supple Neck,No JVD,.  Lungs-  CTAB , fair symmetrical air movement CV- S1, S2 normal, irregularly irregular heart rate 110s Abd-  +ve B.Sounds, Abd Soft, No tenderness,    Extremity/Skin:- No  edema, pedal pulses present  Psych-affect is appropriate, oriented x3 Neuro-no new focal deficits, no tremors   Data Review:   Micro Results Recent Results (from the past 240 hour(s))  Respiratory  Panel by RT PCR (Flu A&B, Covid) - Nasopharyngeal Swab     Status: None   Collection Time: 03/13/19  1:49 PM   Specimen: Nasopharyngeal Swab  Result Value Ref Range Status   SARS Coronavirus 2 by RT PCR NEGATIVE NEGATIVE Final    Comment: (NOTE) SARS-CoV-2 target nucleic acids are NOT DETECTED. The SARS-CoV-2 RNA is generally detectable in upper respiratoy specimens during the acute phase of infection. The lowest concentration of SARS-CoV-2 viral copies this assay can detect is 131 copies/mL. A negative result does not preclude SARS-Cov-2 infection and should not be used as the sole basis for treatment or other patient management decisions. A negative result may occur with  improper specimen collection/handling, submission of specimen other than nasopharyngeal swab, presence of viral mutation(s) within  the areas targeted by this assay, and inadequate number of viral copies (<131 copies/mL). A negative result must be combined with clinical observations, patient history, and epidemiological information. The expected result is Negative. Fact Sheet for Patients:  PinkCheek.be Fact Sheet for Healthcare Providers:  GravelBags.it This test is not yet ap proved or cleared by the [place] FDA and  has been authorized for detection and/or diagnosis of SARS-CoV-2 by FDA under an Emergency Use Authorization (EUA). This EUA will remain  in effect (meaning this test can be used) for the duration of the COVID-19 declaration under Section 564(b)(1) of the Act, 21 U.S.C. section 360bbb-3(b)(1), unless the authorization is terminated or revoked sooner.    Influenza A by PCR NEGATIVE NEGATIVE Final   Influenza B by PCR NEGATIVE NEGATIVE Final    Comment: (NOTE) The Xpert Xpress SARS-CoV-2/FLU/RSV assay is intended as an aid in  the diagnosis of influenza from Nasopharyngeal swab specimens and  should not be used as a sole basis for  treatment. Nasal washings and  aspirates are unacceptable for Xpert Xpress SARS-CoV-2/FLU/RSV  testing. Fact Sheet for Patients: PinkCheek.be Fact Sheet for Healthcare Providers: GravelBags.it This test is not yet approved or cleared by the [place] FDA and  has been authorized for detection and/or diagnosis of SARS-CoV-2 by  FDA under an Emergency Use Authorization (EUA). This EUA will remain  in effect (meaning this test can be used) for the duration of the  Covid-19 declaration under Section 564(b)(1) of the Act, 21  U.S.C. section 360bbb-3(b)(1), unless the authorization is  terminated or revoked. Performed at [facility], [address], [place], [place] [address]   MRSA PCR Screening     Status: None   Collection Time: 03/13/19  5:18 PM   Specimen: Nasal Mucosa; Nasopharyngeal  Result Value Ref Range Status   MRSA by PCR NEGATIVE NEGATIVE Final    Comment:        The GeneXpert MRSA Assay (FDA approved for NASAL specimens only), is one component of a comprehensive MRSA colonization surveillance program. It is not intended to diagnose MRSA infection nor to guide or monitor treatment for MRSA infections. Performed at [facility], [address], [place], [place] [address]     Radiology Reports DG Chest [place] 1 View  Result Date: 03/13/2019 CLINICAL DATA:  Cough, elevated heart rate, history of atrial fibrillation. EXAM: PORTABLE CHEST 1 VIEW COMPARISON:  09/12/2018 FINDINGS: Portable exam with lordotic positioning. Heart size is stable. Leads and pacer defibrillator pads project over the central chest. Mild interstitial prominence is not significantly changed from the study of 09/12/2018. No signs of dense consolidation or evidence of pleural effusion. Visualized skeletal structures are unremarkable. IMPRESSION: Stable mild interstitial prominence, no consolidation or effusion. Multiple leads project over the  patient's chest. Electronically Signed   By: Zetta Bills M.D.   On: 03/13/2019 11:09   ECHOCARDIOGRAM COMPLETE  Result Date: 03/13/2019   ECHOCARDIOGRAM REPORT   Patient Name:   Jesse Murphy Date of Exam: 03/13/2019 Medical Rec #:  [id number]    Height:       68.0 in Accession #:    [id number]   Weight:       203.0 lb Date of Birth:  06-Jan-1929   BSA:          2.06 m Patient Age:    11 years     BP:           103/63 mmHg Patient Gender: M  HR:           105 bpm. Exam Location:  [facility] Procedure: 2D Echo Indications:    Abnormal ECG 794.31 / R94.31  History:        Patient has prior history of Echocardiogram examinations, most                 recent 12/29/2017. CAD, Signs/Symptoms:Chest Pain and Dyspnea;                 Risk Factors:Diabetes, Hypertension and Former Smoker. Atrial                 fibrillation with rapid ventricular response , Non Hodgkin's                 lymphoma , Chronic diastolic heart failure.  Sonographer:    Leavy Cella RDCS (AE) Referring Phys: [id number] Micha Erck IMPRESSIONS  1. Left ventricular ejection fraction, by visual estimation, is 30 to 35%. The left ventricle has moderate to severely decreased function. There is mildly increased left ventricular hypertrophy.  2. Left ventricular diastolic parameters are indeterminate in the setting of atrial fibrillation.  3. The left ventricle demonstrates global hypokinesis with paradoxical septal motion.  4. Global right ventricle has mildly reduced systolic function.The right ventricular size is normal. No increase in right ventricular wall thickness.  5. Left atrial size was upper normal.  6. Right atrial size was normal.  7. Mild mitral annular calcification.  8. The mitral valve is grossly normal. Trivial mitral valve regurgitation.  9. The tricuspid valve is grossly normal. Tricuspid valve regurgitation is trivial. 10. The aortic valve is tricuspid. Aortic valve regurgitation is not visualized. Mild aortic valve  sclerosis without stenosis. 11. The pulmonic valve was grossly normal. Pulmonic valve regurgitation is trivial. 12. TR signal is inadequate for assessing pulmonary artery systolic pressure. 13. The inferior vena cava is dilated in size with <50% respiratory variability, suggesting right atrial pressure of 15 mmHg. FINDINGS  Left Ventricle: Left ventricular ejection fraction, by visual estimation, is 30 to 35%. The left ventricle has moderate to severely decreased function. The left ventricle demonstrates global hypokinesis. The left ventricular internal cavity size was the  left ventricle is normal in size. There is mildly increased left ventricular hypertrophy. Left ventricular diastolic parameters are indeterminate. Right Ventricle: The right ventricular size is normal. No increase in right ventricular wall thickness. Global RV systolic function is has mildly reduced systolic function. Left Atrium: Left atrial size was upper normal. Right Atrium: Right atrial size was normal in size Pericardium: There is no evidence of pericardial effusion. Mitral Valve: The mitral valve is grossly normal. Mild mitral annular calcification. Trivial mitral valve regurgitation. Tricuspid Valve: The tricuspid valve is grossly normal. Tricuspid valve regurgitation is trivial. Aortic Valve: The aortic valve is tricuspid. Aortic valve regurgitation is not visualized. Mild aortic valve sclerosis is present, with no evidence of aortic valve stenosis. Mild aortic valve annular calcification. Pulmonic Valve: The pulmonic valve was grossly normal. Pulmonic valve regurgitation is trivial. Pulmonic regurgitation is trivial. Aorta: The aortic root is normal in size and structure. Venous: The inferior vena cava is dilated in size with less than 50% respiratory variability, suggesting right atrial pressure of 15 mmHg. IAS/Shunts: No atrial level shunt detected by color flow Doppler.  LEFT VENTRICLE PLAX 2D LVIDd:         4.61 cm  Diastology  LVIDs:         3.70 cm  LV e' lateral:  8.27 cm/s LV PW:         1.05 cm  LV E/e' lateral: 12.5 LV IVS:        1.36 cm  LV e' medial:    6.53 cm/s LVOT diam:     1.90 cm  LV E/e' medial:  15.8 LV SV:         40 ml LV SV Index:   18.65 LVOT Area:     2.84 cm  RIGHT VENTRICLE RV S prime:     7.62 cm/s TAPSE (M-mode): 1.4 cm LEFT ATRIUM             Index       RIGHT ATRIUM           Index LA diam:        3.70 cm 1.80 cm/m  RA Area:     14.40 cm LA Vol (A2C):   52.0 ml 25.28 ml/m RA Volume:   38.40 ml  18.67 ml/m LA Vol (A4C):   67.6 ml 32.87 ml/m LA Biplane Vol: 60.5 ml 29.42 ml/m   AORTA Ao Root diam: 2.80 cm MITRAL VALVE MV Area (PHT): 3.77 cm              SHUNTS MV PHT:        58.29 msec            Systemic Diam: 1.90 cm MV Decel Time: 201 msec MV E velocity: 103.00 cm/s 103 cm/s MV A velocity: 32.50 cm/s  70.3 cm/s MV E/A ratio:  3.17        1.5  Rozann Lesches MD Electronically signed by Rozann Lesches MD Signature Date/Time: 03/13/2019/4:28:01 PM    Final      CBC Recent Labs  Lab 03/13/19 1056 03/14/19 0402  WBC 6.5 6.8  HGB 11.3* 10.5*  HCT 37.1* 34.4*  PLT 187 194  MCV 95.6 95.3  MCH 29.1 29.1  MCHC 30.5 30.5  RDW 14.4 14.4  LYMPHSABS 0.6*  --   MONOABS 0.5  --   EOSABS 0.1  --   BASOSABS 0.0  --     Chemistries  Recent Labs  Lab 03/13/19 1056 03/13/19 1251 03/14/19 0402  NA 138  --  139  K 3.9  --  3.7  CL 102  --  101  CO2 26  --  29  GLUCOSE 208*  --  115*  BUN 14  --  12  CREATININE 0.96  --  1.03  CALCIUM 8.6*  --  8.7*  MG  --  2.1  --    ------------------------------------------------------------------------------------------------------------------ No results for input(s): CHOL, HDL, LDLCALC, TRIG, CHOLHDL, LDLDIRECT in the last 72 hours.  Lab Results  Component Value Date   HGBA1C 6.1 (H) 03/13/2019   ------------------------------------------------------------------------------------------------------------------ Recent Labs    03/13/19 1251   TSH 3.683   ------------------------------------------------------------------------------------------------------------------ No results for input(s): VITAMINB12, FOLATE, FERRITIN, TIBC, IRON, RETICCTPCT in the last 72 hours.  Coagulation profile No results for input(s): INR, PROTIME in the last 168 hours.  No results for input(s): DDIMER in the last 72 hours.  Cardiac Enzymes No results for input(s): CKMB, TROPONINI, MYOGLOBIN in the last 168 hours.  Invalid input(s): CK ------------------------------------------------------------------------------------------------------------------    Component Value Date/Time   BNP 112.0 (H) 09/12/2018 1660   -Hospitalist service will sign off at this time    Roxan Hockey M.D on 03/14/2019 at 10:54 AM  Go to [email or web address] - for contact info  [facility] - Office  [phone]

## 2019-03-14 NOTE — Progress Notes (Signed)
CRITICAL VALUE ALERT  Critical Value:  Troponin 926  Date & Time Notied:  1/27 0012  Provider Notified: Olevia Bowens  Orders Received/Actions taken: pending

## 2019-03-14 NOTE — H&P (View-Only) (Signed)
Cardiology Consult    Patient ID: Jesse Murphy; [id number]; [birth year]   Admit date: 03/13/2019 Date of Consult: 03/14/2019  Primary Care Provider: Sandi Mealy, MD Primary Cardiologist: Kate Sable, MD   Patient Profile    Jesse Murphy is a 86 y.o. male with past medical history of CAD (s/p PCI in 1992, significant CAD by Coronary CT in 01/2018 --> catheterization not pursued at that time given no recent symptoms), chronic diastolic CHF, paroxysmal atrial fibrillation, HTN, HLD, follicular lymphoma and history of prostate cancer who is being seen today for the evaluation of atrial fibrillation with RVR at the request of Dr. Denton Brick.   History of Present Illness    Mr. Huntsman most recently had a phone visit with Dr. Bronson Ing in 12/2018 and reported episodes of bilateral arm pain but denied any recent chest pain or dyspnea. No changes were made to his medication regimen.   He presented to [facility] ED on 03/13/2019 for evaluation of elevated HR starting earlier that morning. He reported associated chest discomfort, palpitations and bilateral arm pain.  Per EMS report, HR was initially close to 200 bpm and he was given IV Cardizem 20mg  with rate improving to 150.  Initial labs showed WBC 6.5, Hgb 11.3, platelets 187, Na+ 138, K+ 3.9 and creatinine 0.96. Initial HS Troponin 32 with repeat values of 440, 926 and 585. Mg 2.1. TSH 3.683. Respiratory Panel negative for COVID and Influenza. CXR showed no evidence of consolidation or effusion. EKG shows atrial fibrillation with RVR, HR 145 with known LBBB.   DCCV was attempted at 100 J and 200 J in the ED but he remained in atrial fibrillation. He was restarted on PO Lopressor at the time of admission with dosing titrated from 50mg  BID to 50mg  TID. A repeat echocardiogram was obtained and shows his EF is now reduced to 30-35%, previously 55-60% by echo in 12/2017. Echo this admission showed mild LVH, global HK, mildly reduced RV function  and, trivial MR and trivial TR.   In talking with the patient today, he reports having episodes of exertional chest discomfort and bilateral arm pain for the past several weeks. He has not utilized SL NTG and reports symptoms typically resolve with rest. Yesterday, his symptoms persisted and he reports associated palpitations. No palpitations over the past several weeks. He denies any recent orthopnea, PND or lower extremity edema. Has baseline dyspnea on exertion. He remains active at baseline in doing household chores and he is also the primary caregiver for his wife.    Past Medical History:  Diagnosis Date  . Atrial fibrillation ([place])   . CAD (coronary artery disease)    a. s/p PCI in 1992 b. Coronary CT in 01/2018 showing extensive coronary calcification; FFR indeterminate --> medical management pursued at that time as patient not interested in repeat cath  . Cancer [facility])    prostate  . Dyspnea   . Hypertension     Past Surgical History:  Procedure Laterality Date  . COLONOSCOPY N/A 08/25/2017   Procedure: COLONOSCOPY;  Surgeon: Rogene Houston, MD;  Location: [facility] ENDO SUITE;  Service: Endoscopy;  Laterality: N/A;  . HERNIA REPAIR    . PROSTATE SURGERY       Home Medications:  Prior to Admission medications   Medication Sig Start Date End Date Taking? Authorizing Provider  acetaminophen (TYLENOL) 325 MG tablet Take 325 mg by mouth every 8 (eight) hours as needed for moderate pain.  [provider]  atorvastatin (LIPITOR) 10 MG tablet Take 10 mg by mouth.  09/14/18 09/14/19  [provider]  fluticasone (FLONASE) 50 MCG/ACT nasal spray daily as needed.  10/08/14   [provider]  furosemide (LASIX) 20 MG tablet Take 20 mg by mouth daily.  10/13/17   [provider]  loratadine (CLARITIN) 10 MG tablet Take 10 mg by mouth daily.    [provider]  metoprolol tartrate (LOPRESSOR) 50 MG tablet Take 1 tablet (50 mg total) by mouth 2 (two)  times daily. 10/03/18 02/06/19  Strader, Fransisco Hertz, PA-C  pantoprazole (PROTONIX) 40 MG tablet Take 1 tablet (40 mg total) by mouth daily. 09/14/18   Elgergawy, Silver Huguenin, MD  Pediatric Multivitamins-Iron (FLINTSTONES PLUS IRON) chewable tablet Chew 1 tablet by mouth 2 (two) times daily. 08/25/17   Rogene Houston, MD  rivaroxaban (XARELTO) 20 MG TABS tablet Take 1 tablet (20 mg total) by mouth daily with supper. 08/28/17   Rogene Houston, MD  tamsulosin (FLOMAX) 0.4 MG CAPS capsule Take by mouth. 01/15/19   [provider]    Inpatient Medications: Scheduled Meds: . aspirin EC  81 mg Oral Daily  . atorvastatin  10 mg Oral q1800  . Chlorhexidine Gluconate Cloth  6 each Topical Daily  . folic acid  1 mg Oral Daily  . furosemide  20 mg Oral Daily  . insulin aspart  0-5 Units Subcutaneous QHS  . insulin aspart  0-9 Units Subcutaneous TID WC  . loratadine  10 mg Oral Daily  . metoprolol tartrate  50 mg Oral Q8H  . multivitamin with minerals  1 tablet Oral Daily  . pantoprazole  40 mg Oral Daily  . rivaroxaban  20 mg Oral Q supper  . sodium chloride flush  3 mL Intravenous Q12H  . tamsulosin  0.4 mg Oral QPC supper  . thiamine  100 mg Oral Daily   Continuous Infusions: . sodium chloride     PRN Meds: sodium chloride, acetaminophen **OR** acetaminophen, metoprolol tartrate, ondansetron **OR** ondansetron (ZOFRAN) IV, polyethylene glycol, sodium chloride flush, traZODone  Allergies:   No Known Allergies  Social History:   Social History   Socioeconomic History  . Marital status: Married    Spouse name: Not on file  . Number of children: 2  . Years of education: Not on file  . Highest education level: Not on file  Occupational History  . Occupation: Librarian, academic in supply    Comment: [employer]  Tobacco Use  . Smoking status: Former Smoker    Packs/day: 0.25    Years: 50.00    Pack years: 12.50    Types: Cigarettes    Quit date: 02/15/1990    Years since quitting: 29.0   . Smokeless tobacco: Never Used  Substance and Sexual Activity  . Alcohol use: No  . Drug use: No  . Sexual activity: Not Currently  Other Topics Concern  . Not on file  Social History Narrative  . Not on file   Social Determinants of Health   Financial Resource Strain:   . Difficulty of Paying Living Expenses: Not on file  Food Insecurity:   . Worried About Charity fundraiser in the Last Year: Not on file  . Ran Out of Food in the Last Year: Not on file  Transportation Needs:   . Lack of Transportation (Medical): Not on file  . Lack of Transportation (Non-Medical): Not on file  Physical Activity:   . Days of  Exercise per Week: Not on file  . Minutes of Exercise per Session: Not on file  Stress:   . Feeling of Stress : Not on file  Social Connections:   . Frequency of Communication with Friends and Family: Not on file  . Frequency of Social Gatherings with Friends and Family: Not on file  . Attends Religious Services: Not on file  . Active Member of Clubs or Organizations: Not on file  . Attends Archivist Meetings: Not on file  . Marital Status: Not on file  Intimate Partner Violence:   . Fear of Current or Ex-Partner: Not on file  . Emotionally Abused: Not on file  . Physically Abused: Not on file  . Sexually Abused: Not on file     Family History:    Family History  Problem Relation Age of Onset  . CAD Mother 68  . CAD Father 51  . CAD Sister   . CAD Brother       Review of Systems    General:  No chills, fever, night sweats or weight changes.  Cardiovascular:  No edema, orthopnea,  paroxysmal nocturnal dyspnea. Positive for chest pain, palpitations and dyspnea on exertion.  Dermatological: No rash, lesions/masses Respiratory: No cough, dyspnea Urologic: No hematuria, dysuria Abdominal:   No nausea, vomiting, diarrhea, bright red blood per rectum, melena, or hematemesis Neurologic:  No visual changes, wkns, changes in mental status. All other  systems reviewed and are otherwise negative except as noted above.  Physical Exam/Data    Vitals:   03/14/19 0600 03/14/19 0616 03/14/19 0728 03/14/19 0730  BP: 108/69 108/69  112/75  Pulse: (!) 112 (!) 103  (!) 109  Resp: 18   (!) 23  Temp:   97.8 F (36.6 C)   TempSrc:   Oral   SpO2: 96%   95%  Weight:  89.2 kg    Height:        Intake/Output Summary (Last 24 hours) at 03/14/2019 0751 Last data filed at 03/13/2019 2121 Gross per 24 hour  Intake --  Output 575 ml  Net -575 ml   Filed Weights   03/13/19 1038 03/13/19 1706 03/14/19 0616  Weight: 92.1 kg 89.3 kg 89.2 kg   Body mass index is 29.9 kg/m.   General: Pleasant, male appearing in NAD Psych: Normal affect. Neuro: Alert and oriented X 3. Moves all extremities spontaneously. HEENT: Normal  Neck: Supple without bruits or JVD. Lungs:  Resp regular and unlabored, CTA without wheezing or rales. Heart: Irregularly irregular. no s3, s4, or murmurs. Abdomen: Soft, non-tender, non-distended, BS + x 4.  Extremities: No clubbing, cyanosis. Trace lower extremity edema bilaterally. DP/PT/Radials 2+ and equal bilaterally.   EKG:  The EKG was personally reviewed and demonstrates: Atrial fibrillation with RVR, HR 145 with known LBBB.   Telemetry:  Telemetry was personally reviewed and demonstrates: Atrial fibrillation, HR in 90's to low-100's overnight. Elevated into the 130's to 140's this AM while consuming breakfast.    Labs/Studies     Relevant CV Studies:  Coronary CT: 01/2018 FINDINGS: Non-cardiac: See separate report from [facility].  Pulmonary veins drain normally to the left atrium.  Calcium Score: 1169 Agatston units.  Coronary Arteries: Right dominant with no anomalies  LM: Calcified plaque distal left main, mild (<50%) stenosis.  LAD system: Extensive mixed plaque. Suspect no more than 50% stenosis in the proximal LAD. Possible severe (70-90%) mid LAD stenosis.  Circumflex system:  Small ramus, possible severe (70-90%) ostial stenosis  with calcified plaque. Calcified plaque ostial LCx, mild stenosis. Large high OM1, calcified plaque proximally with mild stenosis. Relatively small AV LCx with minimal disease.  RCA system: Extensive mixed plaque throughout the RCA. Suspect 50% proximal RCA stenosis. Possible severe (70-90%) mid RCA stenosis and moderate (51-69%) distal RCA stenosis.  IMPRESSION: 1. Coronary artery calcium score 1169 Agatston units. This places the patient in the 67th percentile for his age group and gender, suggesting intermediate risk for future cardiac events.  2. Extensive coronary calcification (common in patients > 80) make interpretation of this study difficult due to blooming artifact.  3. Possible severe stenosis in the mid RCA and mid LAD, as well as in the ostial ramus (small vessel). Will send for FFR to try to confirm.    Echocardiogram: 03/13/2019 IMPRESSIONS    1. Left ventricular ejection fraction, by visual estimation, is 30 to 35%. The left ventricle has moderate to severely decreased function. There is mildly increased left ventricular hypertrophy.  2. Left ventricular diastolic parameters are indeterminate in the setting of atrial fibrillation.  3. The left ventricle demonstrates global hypokinesis with paradoxical septal motion.  4. Global right ventricle has mildly reduced systolic function.The right ventricular size is normal. No increase in right ventricular wall thickness.  5. Left atrial size was upper normal.  6. Right atrial size was normal.  7. Mild mitral annular calcification.  8. The mitral valve is grossly normal. Trivial mitral valve regurgitation.  9. The tricuspid valve is grossly normal. Tricuspid valve regurgitation is trivial. 10. The aortic valve is tricuspid. Aortic valve regurgitation is not visualized. Mild aortic valve sclerosis without stenosis. 11. The pulmonic valve was grossly normal.  Pulmonic valve regurgitation is trivial. 12. TR signal is inadequate for assessing pulmonary artery systolic pressure. 13. The inferior vena cava is dilated in size with <50% respiratory variability, suggesting right atrial pressure of 15 mmHg.  Laboratory Data:  Chemistry Recent Labs  Lab 03/13/19 1056 03/14/19 0402  NA 138 139  K 3.9 3.7  CL 102 101  CO2 26 29  GLUCOSE 208* 115*  BUN 14 12  CREATININE 0.96 1.03  CALCIUM 8.6* 8.7*  GFRNONAA >60 >60  GFRAA >60 >60  ANIONGAP 10 9    No results for input(s): PROT, ALBUMIN, AST, ALT, ALKPHOS, BILITOT in the last 168 hours. Hematology Recent Labs  Lab 03/13/19 1056 03/14/19 0402  WBC 6.5 6.8  RBC 3.88* 3.61*  HGB 11.3* 10.5*  HCT 37.1* 34.4*  MCV 95.6 95.3  MCH 29.1 29.1  MCHC 30.5 30.5  RDW 14.4 14.4  PLT 187 194   Cardiac EnzymesNo results for input(s): TROPONINI in the last 168 hours. No results for input(s): TROPIPOC in the last 168 hours.  BNPNo results for input(s): BNP, PROBNP in the last 168 hours.  DDimer No results for input(s): DDIMER in the last 168 hours.  Radiology/Studies:  DG Chest Port 1 View  Result Date: 03/13/2019 CLINICAL DATA:  Cough, elevated heart rate, history of atrial fibrillation. EXAM: PORTABLE CHEST 1 VIEW COMPARISON:  09/12/2018 FINDINGS: Portable exam with lordotic positioning. Heart size is stable. Leads and pacer defibrillator pads project over the central chest. Mild interstitial prominence is not significantly changed from the study of 09/12/2018. No signs of dense consolidation or evidence of pleural effusion. Visualized skeletal structures are unremarkable. IMPRESSION: Stable mild interstitial prominence, no consolidation or effusion. Multiple leads project over the patient's chest. Electronically Signed   By: Zetta Bills M.D.   On: 03/13/2019 11:09  ECHOCARDIOGRAM COMPLETE  Result Date: 03/13/2019   ECHOCARDIOGRAM REPORT   Patient Name:   VELDON WAGER Date of Exam: 03/13/2019  Medical Rec #:  [id number]    Height:       68.0 in Accession #:    [id number]   Weight:       203.0 lb Date of Birth:  1929-01-03   BSA:          2.06 m Patient Age:    31 years     BP:           103/63 mmHg Patient Gender: M            HR:           105 bpm. Exam Location:  [facility] Procedure: 2D Echo Indications:    Abnormal ECG 794.31 / R94.31  History:        Patient has prior history of Echocardiogram examinations, most                 recent 12/29/2017. CAD, Signs/Symptoms:Chest Pain and Dyspnea;                 Risk Factors:Diabetes, Hypertension and Former Smoker. Atrial                 fibrillation with rapid ventricular response , Non Hodgkin's                 lymphoma , Chronic diastolic heart failure.  Sonographer:    Leavy Cella RDCS (AE) Referring Phys: [id number] COURAGE EMOKPAE IMPRESSIONS  1. Left ventricular ejection fraction, by visual estimation, is 30 to 35%. The left ventricle has moderate to severely decreased function. There is mildly increased left ventricular hypertrophy.  2. Left ventricular diastolic parameters are indeterminate in the setting of atrial fibrillation.  3. The left ventricle demonstrates global hypokinesis with paradoxical septal motion.  4. Global right ventricle has mildly reduced systolic function.The right ventricular size is normal. No increase in right ventricular wall thickness.  5. Left atrial size was upper normal.  6. Right atrial size was normal.  7. Mild mitral annular calcification.  8. The mitral valve is grossly normal. Trivial mitral valve regurgitation.  9. The tricuspid valve is grossly normal. Tricuspid valve regurgitation is trivial. 10. The aortic valve is tricuspid. Aortic valve regurgitation is not visualized. Mild aortic valve sclerosis without stenosis. 11. The pulmonic valve was grossly normal. Pulmonic valve regurgitation is trivial. 12. TR signal is inadequate for assessing pulmonary artery systolic pressure. 13. The inferior vena cava is  dilated in size with <50% respiratory variability, suggesting right atrial pressure of 15 mmHg. FINDINGS  Left Ventricle: Left ventricular ejection fraction, by visual estimation, is 30 to 35%. The left ventricle has moderate to severely decreased function. The left ventricle demonstrates global hypokinesis. The left ventricular internal cavity size was the  left ventricle is normal in size. There is mildly increased left ventricular hypertrophy. Left ventricular diastolic parameters are indeterminate. Right Ventricle: The right ventricular size is normal. No increase in right ventricular wall thickness. Global RV systolic function is has mildly reduced systolic function. Left Atrium: Left atrial size was upper normal. Right Atrium: Right atrial size was normal in size Pericardium: There is no evidence of pericardial effusion. Mitral Valve: The mitral valve is grossly normal. Mild mitral annular calcification. Trivial mitral valve regurgitation. Tricuspid Valve: The tricuspid valve is grossly normal. Tricuspid valve regurgitation is trivial. Aortic Valve: The aortic valve is tricuspid. Aortic valve  regurgitation is not visualized. Mild aortic valve sclerosis is present, with no evidence of aortic valve stenosis. Mild aortic valve annular calcification. Pulmonic Valve: The pulmonic valve was grossly normal. Pulmonic valve regurgitation is trivial. Pulmonic regurgitation is trivial. Aorta: The aortic root is normal in size and structure. Venous: The inferior vena cava is dilated in size with less than 50% respiratory variability, suggesting right atrial pressure of 15 mmHg. IAS/Shunts: No atrial level shunt detected by color flow Doppler.  LEFT VENTRICLE PLAX 2D LVIDd:         4.61 cm  Diastology LVIDs:         3.70 cm  LV e' lateral:   8.27 cm/s LV PW:         1.05 cm  LV E/e' lateral: 12.5 LV IVS:        1.36 cm  LV e' medial:    6.53 cm/s LVOT diam:     1.90 cm  LV E/e' medial:  15.8 LV SV:         40 ml LV SV  Index:   18.65 LVOT Area:     2.84 cm  RIGHT VENTRICLE RV S prime:     7.62 cm/s TAPSE (M-mode): 1.4 cm LEFT ATRIUM             Index       RIGHT ATRIUM           Index LA diam:        3.70 cm 1.80 cm/m  RA Area:     14.40 cm LA Vol (A2C):   52.0 ml 25.28 ml/m RA Volume:   38.40 ml  18.67 ml/m LA Vol (A4C):   67.6 ml 32.87 ml/m LA Biplane Vol: 60.5 ml 29.42 ml/m   AORTA Ao Root diam: 2.80 cm MITRAL VALVE MV Area (PHT): 3.77 cm              SHUNTS MV PHT:        58.29 msec            Systemic Diam: 1.90 cm MV Decel Time: 201 msec MV E velocity: 103.00 cm/s 103 cm/s MV A velocity: 32.50 cm/s  70.3 cm/s MV E/A ratio:  3.17        1.5  Rozann Lesches MD Electronically signed by Rozann Lesches MD Signature Date/Time: 03/13/2019/4:28:01 PM    Final      Assessment & Plan    1. Chest Pain concerning for Accelerating Angina/Elevated Troponin - he has a known history of CAD and underwent PCI in 1992 with significant CAD by Coronary CT in 01/2018 as outlined above.  - he reports progressive exertional chest pain with associated bilateral arm pain for the past several weeks which resolves with rest. Initial HS Troponin 32 with repeat values of 440, 926 and 585. Suspect this is secondary to demand ischemia in the setting of his atrial fibrillation with RVR.  - discussed with Dr. Domenic Polite and will plan for a [facility] tomorrow. Cannot be performed today given that he took Xarelto last night. He did undergo DCCV yesterday but did not convert.  - will plan to stop Xarelto and start Heparin this evening. Continue ASA, BB and statin therapy.   2. Atrial Fibrillation with RVR - known history of paroxysmal atrial fibrillation but presented in atrial fibrillation with RVR. Underwent DCCVx2 while in the ED but did not convert. Electrolytes and TSH within normal limits. - rates in the 90's to low-100's overnight but now in  the 130's. PTA Lopressor already titrated from 50mg  BID to 50mg  TID. Will write for PRN IV  Lopressor with hold parameters. Will try to avoid IV Cardizem given his reduced EF. Following catheterization, would anticipate initiation of Amiodarone and attempt at DCCV if rates remain poorly controlled.   - on Xarelto prior to admission. Will hold in anticipation of catheterization and bridge with Heparin.   3. New Cardiomyopathy - EF reduced to 30-35% the admission which is a new diagnosis for the patient. He does have trace edema on examination but lungs are clear. Plan for [facility] as outlined above.  - currently on Lopressor for his atrial fibrillation which can be converted to Toprol-XL prior to discharge. Plan to start ARB prior to discharge if BP allows.   4. HTN - BP has been stable at 95/56 - 137/96 within the past 24 hours. Continue Lopressor with dose adjustment as outlined above.   5. HLD - followed by PCP. Remains on Atorvastatin 10mg  daily.   For questions or updates, please contact [place] Please consult [email or web address] for contact info under Cardiology/STEMI.  Signed, Erma Heritage, PA-C 03/14/2019, 7:51 AM Pager: [phone]   Attending note:  Patient seen and examined.  I reviewed extensive records and discussed the case with Ms. Ahmed Prima PA-C.  Mr. Besecker is a functional 86 year old male (primary caregiver for his wife at home) with a history of CAD and remote PCI in 1990s, paroxysmal atrial fibrillation on Xarelto, hypertension, hyperlipidemia, and follicular lymphoma that has been well managed and stable.  He presents to the hospital with recurrent, rapid atrial fibrillation of recent onset, possibly within the last 48 hours.  Electrical cardioversion was attempted in the ER without success and he was subsequently admitted for heart rate control.  Over the last month however he has been experiencing recurring episodes of chest and bilateral arm pain more suggestive of angina, and not in association with any palpitations elevated heart rate.  He has been managed  medically for CAD by Dr. Bronson Ing.  Coronary CTA from December 2019 showed fairly extensive calcification with possibly severe stenosis involving the mid LAD and mid RCA as well as a small ramus intermedius.  FFR was not able to be calculated.  On examination he is in no acute distress.  Afebrile, heart rate currently 120s to 130s in atrial fibrillation, systolic blood pressure 400-867Y.  Telemetry shows rapid atrial fibrillation.  Lungs exhibit no crackles, cardiac exam with irregularly irregular rhythm and soft systolic murmur.  He has no peripheral edema.  Lab work shows potassium 3.7, BUN 12, creatinine 1.03, peak high-sensitivity troponin I 926, hemoglobin 10.5, platelets 194, hemoglobin A1c 6.1%, TSH 3.68.  Chest x-ray reports chronic, mild interstitial prominence, no effusions.  I personally reviewed his ECG which shows atrial fibrillation with old left bundle branch block  Follow-up echocardiogram shows LVEF now in the range of 30 to 35% representing a decrease from previous assessment, global hypokinesis with paradoxical septal motion, mildly reduced RV contraction.  Patient presenting with recent onset rapid atrial fibrillation with known history of PAF on Xarelto.  Over the last month however he has been experiencing accelerating angina (in the absence of palpitations or elevated heart rate) and now has evidence of new LV dysfunction with LVEF 30 to 35%.  High-sensitivity troponin I levels not necessarily suggestive of ACS, particularly in light of unsuccessful cardioversion attempt.  Situation discussed with patient.  Plan is to arrange transfer to our cardiology service at [facility] in anticipation  of a diagnostic right and left heart catheterization tomorrow.  He is not a good operative candidate and likely has multivessel CAD, however at least based on his previous cardiac CTA, he may have PCI options in the LAD and RCA.  Xarelto will be held and he will be started on heparin.   Increase oral beta-blocker and add as needed IV Lopressor for better heart rate control (we will try to avoid Cardizem in light of cardiomyopathy, may need to consider amiodarone eventually if needed).  Once coronaries are defined and PCI options addressed, attention can then be turned to cardioversion of his atrial fibrillation.  Satira Sark, M.D., F.A.C.C.

## 2019-03-14 NOTE — Progress Notes (Signed)
ANTICOAGULATION CONSULT NOTE - Initial Consult  Pharmacy Consult for xarelto--> Heparin Indication: atrial fibrillation  No Known Allergies  Patient Measurements: Height: 5\' 8"  (172.7 cm) Weight: 196 lb 10.4 oz (89.2 kg) IBW/kg (Calculated) : 68.4 HEPARIN DW (KG): 86.6  Vital Signs: Temp: 97.8 F (36.6 C) (01/27 0728) Temp Source: Oral (01/27 0728) BP: 112/75 (01/27 0730) Pulse Rate: 109 (01/27 0730)  Labs: Recent Labs    03/13/19 1056 03/13/19 1056 03/13/19 1251 03/13/19 2257 03/14/19 0402  HGB 11.3*  --   --   --  10.5*  HCT 37.1*  --   --   --  34.4*  PLT 187  --   --   --  194  CREATININE 0.96  --   --   --  1.03  TROPONINIHS 32*   < > 440* 926* 585*   < > = values in this interval not displayed.    Estimated Creatinine Clearance: 53.8 mL/min (by C-G formula based on SCr of 1.03 mg/dL).   Medical History: Past Medical History:  Diagnosis Date  . Atrial fibrillation ([place])   . CAD (coronary artery disease)    a. s/p PCI in 1992 b. Coronary CT in 01/2018 showing extensive coronary calcification; FFR indeterminate --> medical management pursued at that time as patient not interested in repeat cath  . Cancer [facility])    prostate  . Dyspnea   . Hypertension     Medications:  Medications Prior to Admission  Medication Sig Dispense Refill Last Dose  . rivaroxaban (XARELTO) 20 MG TABS tablet Take 1 tablet (20 mg total) by mouth daily with supper.   03/13/2019 at 1830  . acetaminophen (TYLENOL) 325 MG tablet Take 325 mg by mouth every 8 (eight) hours as needed for moderate pain.      Marland Kitchen atorvastatin (LIPITOR) 10 MG tablet Take 10 mg by mouth.      . fluticasone (FLONASE) 50 MCG/ACT nasal spray daily as needed.      . furosemide (LASIX) 20 MG tablet Take 20 mg by mouth daily.      Marland Kitchen loratadine (CLARITIN) 10 MG tablet Take 10 mg by mouth daily.     . metoprolol tartrate (LOPRESSOR) 50 MG tablet Take 1 tablet (50 mg total) by mouth 2 (two) times daily. 180 tablet 3   .  pantoprazole (PROTONIX) 40 MG tablet Take 1 tablet (40 mg total) by mouth daily. 30 tablet 0   . Pediatric Multivitamins-Iron (FLINTSTONES PLUS IRON) chewable tablet Chew 1 tablet by mouth 2 (two) times daily.     . tamsulosin (FLOMAX) 0.4 MG CAPS capsule Take by mouth.       Assessment: 86 y.o. male with past medical history of CAD , chronic diastolic CHF, paroxysmal atrial fibrillation, HTN, HLD, follicular lymphoma and history of prostate cancer. He presented to ED with elevated HR and continues to be in afib.  He is chronically anticoagulated with xarelto, last dose 1/26 at 1830. Pharmacy asked to transition to heparin in anticipation of catheterization. Will obtain baseline labs and adjust per APTT since on DOAC until correlates with Heparin levels.   Goal of Therapy:  Heparin level 0.3-0.7 units/ml aPTT 66-102 seconds Monitor platelets by anticoagulation protocol: Yes   Plan:  No bolus, Start heparin infusion at 1200 units/hr at 1830 Check anti-Xa level in ~ 8 hours and daily while on heparin Continue to monitor H&H and platelets  Isac Sarna, BS Vena [place], BCPS Clinical Pharmacist Pager [phone] 03/14/2019,9:03 AM

## 2019-03-14 NOTE — Plan of Care (Signed)
  Problem: Education: Goal: Knowledge of General Education information will improve Description: Including pain rating scale, medication(s)/side effects and non-pharmacologic comfort measures Outcome: Progressing   Problem: Health Behavior/Discharge Planning: Goal: Ability to manage health-related needs will improve Outcome: Progressing   Problem: Clinical Measurements: Goal: Ability to maintain clinical measurements within normal limits will improve Outcome: Progressing Goal: Will remain free from infection Outcome: Progressing Goal: Diagnostic test results will improve Outcome: Progressing Goal: Respiratory complications will improve Outcome: Progressing Goal: Cardiovascular complication will be avoided Outcome: Progressing   Problem: Activity: Goal: Risk for activity intolerance will decrease Outcome: Progressing   Problem: Nutrition: Goal: Adequate nutrition will be maintained Outcome: Progressing   Problem: Coping: Goal: Level of anxiety will decrease Outcome: Progressing   Problem: Elimination: Goal: Will not experience complications related to bowel motility Outcome: Progressing Goal: Will not experience complications related to urinary retention Outcome: Progressing   Problem: Pain Managment: Goal: General experience of comfort will improve Outcome: Progressing   Problem: Safety: Goal: Ability to remain free from injury will improve Outcome: Progressing   Problem: Skin Integrity: Goal: Risk for impaired skin integrity will decrease Outcome: Progressing   Problem: Education: Goal: Knowledge of disease or condition will improve Outcome: Progressing Goal: Understanding of medication regimen will improve Outcome: Progressing Goal: Individualized Educational Video(s) Outcome: Progressing   Problem: Activity: Goal: Ability to tolerate increased activity will improve Outcome: Progressing   Problem: Cardiac: Goal: Ability to achieve and maintain  adequate cardiopulmonary perfusion will improve Outcome: Progressing   Problem: Health Behavior/Discharge Planning: Goal: Ability to safely manage health-related needs after discharge will improve Outcome: Progressing   Problem: Education: Goal: Understanding of CV disease, CV risk reduction, and recovery process will improve Outcome: Progressing Goal: Individualized Educational Video(s) Outcome: Progressing   Problem: Activity: Goal: Ability to return to baseline activity level will improve Outcome: Progressing   Problem: Cardiovascular: Goal: Ability to achieve and maintain adequate cardiovascular perfusion will improve Outcome: Progressing Goal: Vascular access site(s) Level 0-1 will be maintained Outcome: Progressing   Problem: Health Behavior/Discharge Planning: Goal: Ability to safely manage health-related needs after discharge will improve Outcome: Progressing

## 2019-03-14 NOTE — Consult Note (Addendum)
Cardiology Consult    Patient ID: SAMAEL BLADES; [id number]; 26-Apr-1928   Admit date: 03/13/2019 Date of Consult: 03/14/2019  Primary Care Provider: Sandi Mealy, MD Primary Cardiologist: Kate Sable, MD   Patient Profile    MAISEN KLINGLER is a 86 y.o. male with past medical history of CAD (s/p PCI in 1992, significant CAD by Coronary CT in 01/2018 --> catheterization not pursued at that time given no recent symptoms), chronic diastolic CHF, paroxysmal atrial fibrillation, HTN, HLD, follicular lymphoma and history of prostate cancer who is being seen today for the evaluation of atrial fibrillation with RVR at the request of Dr. Denton Brick.   History of Present Illness    Mr. Arndt most recently had a phone visit with Dr. Bronson Ing in 12/2018 and reported episodes of bilateral arm pain but denied any recent chest pain or dyspnea. No changes were made to his medication regimen.   He presented to [facility] ED on 03/13/2019 for evaluation of elevated HR starting earlier that morning. He reported associated chest discomfort, palpitations and bilateral arm pain.  Per EMS report, HR was initially close to 200 bpm and he was given IV Cardizem 20mg  with rate improving to 150.  Initial labs showed WBC 6.5, Hgb 11.3, platelets 187, Na+ 138, K+ 3.9 and creatinine 0.96. Initial HS Troponin 32 with repeat values of 440, 926 and 585. Mg 2.1. TSH 3.683. Respiratory Panel negative for COVID and Influenza. CXR showed no evidence of consolidation or effusion. EKG shows atrial fibrillation with RVR, HR 145 with known LBBB.   DCCV was attempted at 100 J and 200 J in the ED but he remained in atrial fibrillation. He was restarted on PO Lopressor at the time of admission with dosing titrated from 50mg  BID to 50mg  TID. A repeat echocardiogram was obtained and shows his EF is now reduced to 30-35%, previously 55-60% by echo in 12/2017. Echo this admission showed mild LVH, global HK, mildly reduced RV function  and, trivial MR and trivial TR.   In talking with the patient today, he reports having episodes of exertional chest discomfort and bilateral arm pain for the past several weeks. He has not utilized SL NTG and reports symptoms typically resolve with rest. Yesterday, his symptoms persisted and he reports associated palpitations. No palpitations over the past several weeks. He denies any recent orthopnea, PND or lower extremity edema. Has baseline dyspnea on exertion. He remains active at baseline in doing household chores and he is also the primary caregiver for his wife.    Past Medical History:  Diagnosis Date   Atrial fibrillation ([place])    CAD (coronary artery disease)    a. s/p PCI in 1992 b. Coronary CT in 01/2018 showing extensive coronary calcification; FFR indeterminate --> medical management pursued at that time as patient not interested in repeat cath   Cancer [facility])    prostate   Dyspnea    Hypertension     Past Surgical History:  Procedure Laterality Date   COLONOSCOPY N/A 08/25/2017   Procedure: COLONOSCOPY;  Surgeon: Rogene Houston, MD;  Location: [facility] SUITE;  Service: Endoscopy;  Laterality: N/A;   HERNIA REPAIR     PROSTATE SURGERY       Home Medications:  Prior to Admission medications   Medication Sig Start Date End Date Taking? Authorizing Provider  acetaminophen (TYLENOL) 325 MG tablet Take 325 mg by mouth every 8 (eight) hours as needed for moderate pain.  [provider]  atorvastatin (LIPITOR) 10 MG tablet Take 10 mg by mouth.  09/14/18 09/14/19  [provider]  fluticasone (FLONASE) 50 MCG/ACT nasal spray daily as needed.  10/08/14   [provider]  furosemide (LASIX) 20 MG tablet Take 20 mg by mouth daily.  10/13/17   [provider]  loratadine (CLARITIN) 10 MG tablet Take 10 mg by mouth daily.    [provider]  metoprolol tartrate (LOPRESSOR) 50 MG tablet Take 1 tablet (50 mg total) by mouth 2 (two)  times daily. 10/03/18 02/06/19  Strader, Fransisco Hertz, PA-C  pantoprazole (PROTONIX) 40 MG tablet Take 1 tablet (40 mg total) by mouth daily. 09/14/18   Elgergawy, Silver Huguenin, MD  Pediatric Multivitamins-Iron (FLINTSTONES PLUS IRON) chewable tablet Chew 1 tablet by mouth 2 (two) times daily. 08/25/17   Rogene Houston, MD  rivaroxaban (XARELTO) 20 MG TABS tablet Take 1 tablet (20 mg total) by mouth daily with supper. 08/28/17   Rogene Houston, MD  tamsulosin (FLOMAX) 0.4 MG CAPS capsule Take by mouth. 01/15/19   [provider]    Inpatient Medications: Scheduled Meds:  aspirin EC  81 mg Oral Daily   atorvastatin  10 mg Oral q1800   Chlorhexidine Gluconate Cloth  6 each Topical Daily   folic acid  1 mg Oral Daily   furosemide  20 mg Oral Daily   insulin aspart  0-5 Units Subcutaneous QHS   insulin aspart  0-9 Units Subcutaneous TID WC   loratadine  10 mg Oral Daily   metoprolol tartrate  50 mg Oral Q8H   multivitamin with minerals  1 tablet Oral Daily   pantoprazole  40 mg Oral Daily   rivaroxaban  20 mg Oral Q supper   sodium chloride flush  3 mL Intravenous Q12H   tamsulosin  0.4 mg Oral QPC supper   thiamine  100 mg Oral Daily   Continuous Infusions:  sodium chloride     PRN Meds: sodium chloride, acetaminophen **OR** acetaminophen, metoprolol tartrate, ondansetron **OR** ondansetron (ZOFRAN) IV, polyethylene glycol, sodium chloride flush, traZODone  Allergies:   No Known Allergies  Social History:   Social History   Socioeconomic History   Marital status: Married    Spouse name: Not on file   Number of children: 2   Years of education: Not on file   Highest education level: Not on file  Occupational History   Occupation: Librarian, academic in supply    Comment: [employer]  Tobacco Use   Smoking status: Former Smoker    Packs/day: 0.25    Years: 50.00    Pack years: 12.50    Types: Cigarettes    Quit date: 02/15/1990    Years since quitting: 29.0    Smokeless tobacco: Never Used  Substance and Sexual Activity   Alcohol use: No   Drug use: No   Sexual activity: Not Currently  Other Topics Concern   Not on file  Social History Narrative   Not on file   Social Determinants of Health   Financial Resource Strain:    Difficulty of Paying Living Expenses: Not on file  Food Insecurity:    Worried About Charity fundraiser in the Last Year: Not on file   [employer] of Food in the Last Year: Not on file  Transportation Needs:    Lack of Transportation (Medical): Not on file   Lack of Transportation (Non-Medical): Not on file  Physical Activity:    Days of  Exercise per Week: Not on file   Minutes of Exercise per Session: Not on file  Stress:    Feeling of Stress : Not on file  Social Connections:    Frequency of Communication with Friends and Family: Not on file   Frequency of Social Gatherings with Friends and Family: Not on file   Attends Religious Services: Not on file   Active Member of Clubs or Organizations: Not on file   Attends Archivist Meetings: Not on file   Marital Status: Not on file  Intimate Partner Violence:    Fear of Current or Ex-Partner: Not on file   Emotionally Abused: Not on file   Physically Abused: Not on file   Sexually Abused: Not on file     Family History:    Family History  Problem Relation Age of Onset   CAD Mother 27   CAD Father 62   CAD Sister    CAD Brother       Review of Systems    General:  No chills, fever, night sweats or weight changes.  Cardiovascular:  No edema, orthopnea,  paroxysmal nocturnal dyspnea. Positive for chest pain, palpitations and dyspnea on exertion.  Dermatological: No rash, lesions/masses Respiratory: No cough, dyspnea Urologic: No hematuria, dysuria Abdominal:   No nausea, vomiting, diarrhea, bright red blood per rectum, melena, or hematemesis Neurologic:  No visual changes, wkns, changes in mental status. All other  systems reviewed and are otherwise negative except as noted above.  Physical Exam/Data    Vitals:   03/14/19 0600 03/14/19 0616 03/14/19 0728 03/14/19 0730  BP: 108/69 108/69  112/75  Pulse: (!) 112 (!) 103  (!) 109  Resp: 18   (!) 23  Temp:   97.8 F (36.6 C)   TempSrc:   Oral   SpO2: 96%   95%  Weight:  89.2 kg    Height:        Intake/Output Summary (Last 24 hours) at 03/14/2019 0751 Last data filed at 03/13/2019 2121 Gross per 24 hour  Intake --  Output 575 ml  Net -575 ml   Filed Weights   03/13/19 1038 03/13/19 1706 03/14/19 0616  Weight: 92.1 kg 89.3 kg 89.2 kg   Body mass index is 29.9 kg/m.   General: Pleasant, male appearing in NAD Psych: Normal affect. Neuro: Alert and oriented X 3. Moves all extremities spontaneously. HEENT: Normal  Neck: Supple without bruits or JVD. Lungs:  Resp regular and unlabored, CTA without wheezing or rales. Heart: Irregularly irregular. no s3, s4, or murmurs. Abdomen: Soft, non-tender, non-distended, BS + x 4.  Extremities: No clubbing, cyanosis. Trace lower extremity edema bilaterally. DP/PT/Radials 2+ and equal bilaterally.   EKG:  The EKG was personally reviewed and demonstrates: Atrial fibrillation with RVR, HR 145 with known LBBB.   Telemetry:  Telemetry was personally reviewed and demonstrates: Atrial fibrillation, HR in 90's to low-100's overnight. Elevated into the 130's to 140's this AM while consuming breakfast.    Labs/Studies     Relevant CV Studies:  Coronary CT: 01/2018 FINDINGS: Non-cardiac: See separate report from [facility].  Pulmonary veins drain normally to the left atrium.  Calcium Score: 1169 Agatston units.  Coronary Arteries: Right dominant with no anomalies  LM: Calcified plaque distal left main, mild (<50%) stenosis.  LAD system: Extensive mixed plaque. Suspect no more than 50% stenosis in the proximal LAD. Possible severe (70-90%) mid LAD stenosis.  Circumflex system:  Small ramus, possible severe (70-90%) ostial stenosis  with calcified plaque. Calcified plaque ostial LCx, mild stenosis. Large high OM1, calcified plaque proximally with mild stenosis. Relatively small AV LCx with minimal disease.  RCA system: Extensive mixed plaque throughout the RCA. Suspect 50% proximal RCA stenosis. Possible severe (70-90%) mid RCA stenosis and moderate (51-69%) distal RCA stenosis.  IMPRESSION: 1. Coronary artery calcium score 1169 Agatston units. This places the patient in the 67th percentile for his age group and gender, suggesting intermediate risk for future cardiac events.  2. Extensive coronary calcification (common in patients > 80) make interpretation of this study difficult due to blooming artifact.  3. Possible severe stenosis in the mid RCA and mid LAD, as well as in the ostial ramus (small vessel). Will send for FFR to try to confirm.    Echocardiogram: 03/13/2019 IMPRESSIONS    1. Left ventricular ejection fraction, by visual estimation, is 30 to 35%. The left ventricle has moderate to severely decreased function. There is mildly increased left ventricular hypertrophy.  2. Left ventricular diastolic parameters are indeterminate in the setting of atrial fibrillation.  3. The left ventricle demonstrates global hypokinesis with paradoxical septal motion.  4. Global right ventricle has mildly reduced systolic function.The right ventricular size is normal. No increase in right ventricular wall thickness.  5. Left atrial size was upper normal.  6. Right atrial size was normal.  7. Mild mitral annular calcification.  8. The mitral valve is grossly normal. Trivial mitral valve regurgitation.  9. The tricuspid valve is grossly normal. Tricuspid valve regurgitation is trivial. 10. The aortic valve is tricuspid. Aortic valve regurgitation is not visualized. Mild aortic valve sclerosis without stenosis. 11. The pulmonic valve was grossly normal.  Pulmonic valve regurgitation is trivial. 12. TR signal is inadequate for assessing pulmonary artery systolic pressure. 13. The inferior vena cava is dilated in size with <50% respiratory variability, suggesting right atrial pressure of 15 mmHg.  Laboratory Data:  Chemistry Recent Labs  Lab 03/13/19 1056 03/14/19 0402  NA 138 139  K 3.9 3.7  CL 102 101  CO2 26 29  GLUCOSE 208* 115*  BUN 14 12  CREATININE 0.96 1.03  CALCIUM 8.6* 8.7*  GFRNONAA >60 >60  GFRAA >60 >60  ANIONGAP 10 9    No results for input(s): PROT, ALBUMIN, AST, ALT, ALKPHOS, BILITOT in the last 168 hours. Hematology Recent Labs  Lab 03/13/19 1056 03/14/19 0402  WBC 6.5 6.8  RBC 3.88* 3.61*  HGB 11.3* 10.5*  HCT 37.1* 34.4*  MCV 95.6 95.3  MCH 29.1 29.1  MCHC 30.5 30.5  RDW 14.4 14.4  PLT 187 194   Cardiac EnzymesNo results for input(s): TROPONINI in the last 168 hours. No results for input(s): TROPIPOC in the last 168 hours.  BNPNo results for input(s): BNP, PROBNP in the last 168 hours.  DDimer No results for input(s): DDIMER in the last 168 hours.  Radiology/Studies:  DG Chest Port 1 View  Result Date: 03/13/2019 CLINICAL DATA:  Cough, elevated heart rate, history of atrial fibrillation. EXAM: PORTABLE CHEST 1 VIEW COMPARISON:  09/12/2018 FINDINGS: Portable exam with lordotic positioning. Heart size is stable. Leads and pacer defibrillator pads project over the central chest. Mild interstitial prominence is not significantly changed from the study of 09/12/2018. No signs of dense consolidation or evidence of pleural effusion. Visualized skeletal structures are unremarkable. IMPRESSION: Stable mild interstitial prominence, no consolidation or effusion. Multiple leads project over the patient's chest. Electronically Signed   By: Zetta Bills M.D.   On: 03/13/2019 11:09  ECHOCARDIOGRAM COMPLETE  Result Date: 03/13/2019   ECHOCARDIOGRAM REPORT   Patient Name:   CASEN PRYOR Date of Exam: 03/13/2019  Medical Rec #:  [id number]    Height:       68.0 in Accession #:    [id number]   Weight:       203.0 lb Date of Birth:  [birth year]   BSA:          2.06 m Patient Age:    47 years     BP:           103/63 mmHg Patient Gender: M            HR:           105 bpm. Exam Location:  [facility] Procedure: 2D Echo Indications:    Abnormal ECG 794.31 / R94.31  History:        Patient has prior history of Echocardiogram examinations, most                 recent 12/29/2017. CAD, Signs/Symptoms:Chest Pain and Dyspnea;                 Risk Factors:Diabetes, Hypertension and Former Smoker. Atrial                 fibrillation with rapid ventricular response , Non Hodgkin's                 lymphoma , Chronic diastolic heart failure.  Sonographer:    Leavy Cella RDCS (AE) Referring Phys: [id number] COURAGE EMOKPAE IMPRESSIONS  1. Left ventricular ejection fraction, by visual estimation, is 30 to 35%. The left ventricle has moderate to severely decreased function. There is mildly increased left ventricular hypertrophy.  2. Left ventricular diastolic parameters are indeterminate in the setting of atrial fibrillation.  3. The left ventricle demonstrates global hypokinesis with paradoxical septal motion.  4. Global right ventricle has mildly reduced systolic function.The right ventricular size is normal. No increase in right ventricular wall thickness.  5. Left atrial size was upper normal.  6. Right atrial size was normal.  7. Mild mitral annular calcification.  8. The mitral valve is grossly normal. Trivial mitral valve regurgitation.  9. The tricuspid valve is grossly normal. Tricuspid valve regurgitation is trivial. 10. The aortic valve is tricuspid. Aortic valve regurgitation is not visualized. Mild aortic valve sclerosis without stenosis. 11. The pulmonic valve was grossly normal. Pulmonic valve regurgitation is trivial. 12. TR signal is inadequate for assessing pulmonary artery systolic pressure. 13. The inferior vena cava is  dilated in size with <50% respiratory variability, suggesting right atrial pressure of 15 mmHg. FINDINGS  Left Ventricle: Left ventricular ejection fraction, by visual estimation, is 30 to 35%. The left ventricle has moderate to severely decreased function. The left ventricle demonstrates global hypokinesis. The left ventricular internal cavity size was the  left ventricle is normal in size. There is mildly increased left ventricular hypertrophy. Left ventricular diastolic parameters are indeterminate. Right Ventricle: The right ventricular size is normal. No increase in right ventricular wall thickness. Global RV systolic function is has mildly reduced systolic function. Left Atrium: Left atrial size was upper normal. Right Atrium: Right atrial size was normal in size Pericardium: There is no evidence of pericardial effusion. Mitral Valve: The mitral valve is grossly normal. Mild mitral annular calcification. Trivial mitral valve regurgitation. Tricuspid Valve: The tricuspid valve is grossly normal. Tricuspid valve regurgitation is trivial. Aortic Valve: The aortic valve is tricuspid. Aortic valve  regurgitation is not visualized. Mild aortic valve sclerosis is present, with no evidence of aortic valve stenosis. Mild aortic valve annular calcification. Pulmonic Valve: The pulmonic valve was grossly normal. Pulmonic valve regurgitation is trivial. Pulmonic regurgitation is trivial. Aorta: The aortic root is normal in size and structure. Venous: The inferior vena cava is dilated in size with less than 50% respiratory variability, suggesting right atrial pressure of 15 mmHg. IAS/Shunts: No atrial level shunt detected by color flow Doppler.  LEFT VENTRICLE PLAX 2D LVIDd:         4.61 cm  Diastology LVIDs:         3.70 cm  LV e' lateral:   8.27 cm/s LV PW:         1.05 cm  LV E/e' lateral: 12.5 LV IVS:        1.36 cm  LV e' medial:    6.53 cm/s LVOT diam:     1.90 cm  LV E/e' medial:  15.8 LV SV:         40 ml LV SV  Index:   18.65 LVOT Area:     2.84 cm  RIGHT VENTRICLE RV S prime:     7.62 cm/s TAPSE (M-mode): 1.4 cm LEFT ATRIUM             Index       RIGHT ATRIUM           Index LA diam:        3.70 cm 1.80 cm/m  RA Area:     14.40 cm LA Vol (A2C):   52.0 ml 25.28 ml/m RA Volume:   38.40 ml  18.67 ml/m LA Vol (A4C):   67.6 ml 32.87 ml/m LA Biplane Vol: 60.5 ml 29.42 ml/m   AORTA Ao Root diam: 2.80 cm MITRAL VALVE MV Area (PHT): 3.77 cm              SHUNTS MV PHT:        58.29 msec            Systemic Diam: 1.90 cm MV Decel Time: 201 msec MV E velocity: 103.00 cm/s 103 cm/s MV A velocity: 32.50 cm/s  70.3 cm/s MV E/A ratio:  3.17        1.5  Rozann Lesches MD Electronically signed by Rozann Lesches MD Signature Date/Time: 03/13/2019/4:28:01 PM    Final      Assessment & Plan    1. Chest Pain concerning for Accelerating Angina/Elevated Troponin - he has a known history of CAD and underwent PCI in 1992 with significant CAD by Coronary CT in 01/2018 as outlined above.  - he reports progressive exertional chest pain with associated bilateral arm pain for the past several weeks which resolves with rest. Initial HS Troponin 32 with repeat values of 440, 926 and 585. Suspect this is secondary to demand ischemia in the setting of his atrial fibrillation with RVR.  - discussed with Dr. Domenic Polite and will plan for a [facility] tomorrow. Cannot be performed today given that he took Xarelto last night. He did undergo DCCV yesterday but did not convert.  - will plan to stop Xarelto and start Heparin this evening. Continue ASA, BB and statin therapy.   2. Atrial Fibrillation with RVR - known history of paroxysmal atrial fibrillation but presented in atrial fibrillation with RVR. Underwent DCCVx2 while in the ED but did not convert. Electrolytes and TSH within normal limits. - rates in the 90's to low-100's overnight but now in  the 130's. PTA Lopressor already titrated from 50mg  BID to 50mg  TID. Will write for PRN IV  Lopressor with hold parameters. Will try to avoid IV Cardizem given his reduced EF. Following catheterization, would anticipate initiation of Amiodarone and attempt at DCCV if rates remain poorly controlled.   - on Xarelto prior to admission. Will hold in anticipation of catheterization and bridge with Heparin.   3. New Cardiomyopathy - EF reduced to 30-35% the admission which is a new diagnosis for the patient. He does have trace edema on examination but lungs are clear. Plan for [facility] as outlined above.  - currently on Lopressor for his atrial fibrillation which can be converted to Toprol-XL prior to discharge. Plan to start ARB prior to discharge if BP allows.   4. HTN - BP has been stable at 95/56 - 137/96 within the past 24 hours. Continue Lopressor with dose adjustment as outlined above.   5. HLD - followed by PCP. Remains on Atorvastatin 10mg  daily.   For questions or updates, please contact [facility] Please consult [email or web address] for contact info under Cardiology/STEMI.  Signed, Erma Heritage, PA-C 03/14/2019, 7:51 AM Pager: [phone]   Attending note:  Patient seen and examined.  I reviewed extensive records and discussed the case with Ms. Ahmed Prima PA-C.  Mr. Grieshop is a functional 86 year old male (primary caregiver for his wife at home) with a history of CAD and remote PCI in 1990s, paroxysmal atrial fibrillation on Xarelto, hypertension, hyperlipidemia, and follicular lymphoma that has been well managed and stable.  He presents to the hospital with recurrent, rapid atrial fibrillation of recent onset, possibly within the last 48 hours.  Electrical cardioversion was attempted in the ER without success and he was subsequently admitted for heart rate control.  Over the last month however he has been experiencing recurring episodes of chest and bilateral arm pain more suggestive of angina, and not in association with any palpitations elevated heart rate.  He has been managed  medically for CAD by Dr. Bronson Ing.  Coronary CTA from December 2019 showed fairly extensive calcification with possibly severe stenosis involving the mid LAD and mid RCA as well as a small ramus intermedius.  FFR was not able to be calculated.  On examination he is in no acute distress.  Afebrile, heart rate currently 120s to 130s in atrial fibrillation, systolic blood pressure [id number].  Telemetry shows rapid atrial fibrillation.  Lungs exhibit no crackles, cardiac exam with irregularly irregular rhythm and soft systolic murmur.  He has no peripheral edema.  Lab work shows potassium 3.7, BUN 12, creatinine 1.03, peak high-sensitivity troponin I 926, hemoglobin 10.5, platelets 194, hemoglobin A1c 6.1%, TSH 3.68.  Chest x-ray reports chronic, mild interstitial prominence, no effusions.  I personally reviewed his ECG which shows atrial fibrillation with old left bundle branch block  Follow-up echocardiogram shows LVEF now in the range of 30 to 35% representing a decrease from previous assessment, global hypokinesis with paradoxical septal motion, mildly reduced RV contraction.  Patient presenting with recent onset rapid atrial fibrillation with known history of PAF on Xarelto.  Over the last month however he has been experiencing accelerating angina (in the absence of palpitations or elevated heart rate) and now has evidence of new LV dysfunction with LVEF 30 to 35%.  High-sensitivity troponin I levels not necessarily suggestive of ACS, particularly in light of unsuccessful cardioversion attempt.  Situation discussed with patient.  Plan is to arrange transfer to our cardiology service at [facility] in anticipation  of a diagnostic right and left heart catheterization tomorrow.  He is not a good operative candidate and likely has multivessel CAD, however at least based on his previous cardiac CTA, he may have PCI options in the LAD and RCA.  Xarelto will be held and he will be started on heparin.   Increase oral beta-blocker and add as needed IV Lopressor for better heart rate control (we will try to avoid Cardizem in light of cardiomyopathy, may need to consider amiodarone eventually if needed).  Once coronaries are defined and PCI options addressed, attention can then be turned to cardioversion of his atrial fibrillation.  Satira Sark, M.D., F.A.C.C.

## 2019-03-15 ENCOUNTER — Other Ambulatory Visit: Payer: Self-pay | Admitting: *Deleted

## 2019-03-15 ENCOUNTER — Inpatient Hospital Stay (HOSPITAL_COMMUNITY)
Admission: EM | Disposition: A | Discharge status: Rehabilitation Facility | Payer: Self-pay | Source: Home / Self Care | Attending: Cardiothoracic Surgery

## 2019-03-15 ENCOUNTER — Inpatient Hospital Stay (HOSPITAL_COMMUNITY): Payer: Medicare Other

## 2019-03-15 DIAGNOSIS — Z01810 Encounter for preprocedural cardiovascular examination: Secondary | ICD-10-CM

## 2019-03-15 DIAGNOSIS — I2583 Coronary atherosclerosis due to lipid rich plaque: Secondary | ICD-10-CM

## 2019-03-15 DIAGNOSIS — I2510 Atherosclerotic heart disease of native coronary artery without angina pectoris: Secondary | ICD-10-CM

## 2019-03-15 DIAGNOSIS — I25110 Atherosclerotic heart disease of native coronary artery with unstable angina pectoris: Secondary | ICD-10-CM

## 2019-03-15 DIAGNOSIS — I214 Non-ST elevation (NSTEMI) myocardial infarction: Secondary | ICD-10-CM

## 2019-03-15 DIAGNOSIS — I5043 Acute on chronic combined systolic (congestive) and diastolic (congestive) heart failure: Secondary | ICD-10-CM

## 2019-03-15 HISTORY — PX: RIGHT/LEFT HEART CATH AND CORONARY ANGIOGRAPHY: CATH118266

## 2019-03-15 LAB — HEPARIN LEVEL (UNFRACTIONATED): Heparin Unfractionated: 0.92 IU/mL — ABNORMAL HIGH (ref 0.30–0.70)

## 2019-03-15 LAB — CBC
HCT: 35.5 % — ABNORMAL LOW (ref 39.0–52.0)
Hemoglobin: 11.3 g/dL — ABNORMAL LOW (ref 13.0–17.0)
MCH: 29.3 pg (ref 26.0–34.0)
MCHC: 31.8 g/dL (ref 30.0–36.0)
MCV: 92 fL (ref 80.0–100.0)
Platelets: 181 10*3/uL (ref 150–400)
RBC: 3.86 MIL/uL — ABNORMAL LOW (ref 4.22–5.81)
RDW: 14.5 % (ref 11.5–15.5)
WBC: 7.9 10*3/uL (ref 4.0–10.5)
nRBC: 0 % (ref 0.0–0.2)

## 2019-03-15 LAB — SURGICAL PCR SCREEN
MRSA, PCR: NEGATIVE
Staphylococcus aureus: NEGATIVE

## 2019-03-15 LAB — COMPREHENSIVE METABOLIC PANEL
ALT: 10 U/L (ref 0–44)
AST: 18 U/L (ref 15–41)
Albumin: 3.3 g/dL — ABNORMAL LOW (ref 3.5–5.0)
Alkaline Phosphatase: 56 U/L (ref 38–126)
Anion gap: 10 (ref 5–15)
BUN: 15 mg/dL (ref 8–23)
CO2: 26 mmol/L (ref 22–32)
Calcium: 8.7 mg/dL — ABNORMAL LOW (ref 8.9–10.3)
Chloride: 100 mmol/L (ref 98–111)
Creatinine, Ser: 1.02 mg/dL (ref 0.61–1.24)
GFR calc Af Amer: >60 mL/min (ref >60)
GFR calc non Af Amer: >60 mL/min (ref >60)
Glucose, Bld: 144 mg/dL — ABNORMAL HIGH (ref 70–99)
Potassium: 4.1 mmol/L (ref 3.5–5.1)
Sodium: 136 mmol/L (ref 135–145)
Total Bilirubin: 0.7 mg/dL (ref 0.3–1.2)
Total Protein: 6.3 g/dL — ABNORMAL LOW (ref 6.5–8.1)

## 2019-03-15 LAB — URINALYSIS, ROUTINE W REFLEX MICROSCOPIC
Bilirubin Urine: NEGATIVE
Glucose, UA: NEGATIVE mg/dL
Hgb urine dipstick: NEGATIVE
Ketones, ur: NEGATIVE mg/dL
Leukocytes,Ua: NEGATIVE
Nitrite: NEGATIVE
Protein, ur: NEGATIVE mg/dL
Specific Gravity, Urine: 1.008 (ref 1.005–1.030)
pH: 5 (ref 5.0–8.0)

## 2019-03-15 LAB — POCT I-STAT 7, (LYTES, BLD GAS, ICA,H+H)
Acid-Base Excess: 1 mmol/L (ref 0.0–2.0)
Bicarbonate: 26.6 mmol/L (ref 20.0–28.0)
Calcium, Ion: 1.07 mmol/L — ABNORMAL LOW (ref 1.15–1.40)
HCT: 32 % — ABNORMAL LOW (ref 39.0–52.0)
Hemoglobin: 10.9 g/dL — ABNORMAL LOW (ref 13.0–17.0)
O2 Saturation: 98 %
Potassium: 4.1 mmol/L (ref 3.5–5.1)
Sodium: 141 mmol/L (ref 135–145)
TCO2: 28 mmol/L (ref 22–32)
pCO2 arterial: 43.9 mmHg (ref 32.0–48.0)
pH, Arterial: 7.391 (ref 7.350–7.450)
pO2, Arterial: 101 mmHg (ref 83.0–108.0)

## 2019-03-15 LAB — POCT I-STAT EG7
Acid-Base Excess: 3 mmol/L — ABNORMAL HIGH (ref 0.0–2.0)
Bicarbonate: 29.5 mmol/L — ABNORMAL HIGH (ref 20.0–28.0)
Calcium, Ion: 1.2 mmol/L (ref 1.15–1.40)
HCT: 35 % — ABNORMAL LOW (ref 39.0–52.0)
Hemoglobin: 11.9 g/dL — ABNORMAL LOW (ref 13.0–17.0)
O2 Saturation: 61 %
Potassium: 4.4 mmol/L (ref 3.5–5.1)
Sodium: 139 mmol/L (ref 135–145)
TCO2: 31 mmol/L (ref 22–32)
pCO2, Ven: 51.3 mmHg (ref 44.0–60.0)
pH, Ven: 7.368 (ref 7.250–7.430)
pO2, Ven: 33 mmHg (ref 32.0–45.0)

## 2019-03-15 LAB — PROTIME-INR
INR: 1.2 (ref 0.8–1.2)
Prothrombin Time: 15.4 seconds — ABNORMAL HIGH (ref 11.4–15.2)

## 2019-03-15 LAB — GLUCOSE, CAPILLARY
Glucose-Capillary: 132 mg/dL — ABNORMAL HIGH (ref 70–99)
Glucose-Capillary: 184 mg/dL — ABNORMAL HIGH (ref 70–99)
Glucose-Capillary: 131 mg/dL — ABNORMAL HIGH (ref 70–99)
Glucose-Capillary: 160 mg/dL — ABNORMAL HIGH (ref 70–99)

## 2019-03-15 LAB — APTT: aPTT: 76 seconds — ABNORMAL HIGH (ref 24–36)

## 2019-03-15 LAB — ABO/RH: ABO/RH(D): B POS

## 2019-03-15 SURGERY — RIGHT/LEFT HEART CATH AND CORONARY ANGIOGRAPHY
Anesthesia: LOCAL

## 2019-03-15 MED ORDER — NOREPINEPHRINE 4 MG/250ML-% IV SOLN
0.0000 ug/min | INTRAVENOUS | Status: DC
  Filled 2019-03-15: qty 250

## 2019-03-15 MED ORDER — SODIUM CHLORIDE 0.9 % IV SOLN
1.5000 g | INTRAVENOUS | Status: DC
  Filled 2019-03-15: qty 1.5

## 2019-03-15 MED ORDER — FENTANYL CITRATE (PF) 100 MCG/2ML IJ SOLN
INTRAMUSCULAR | Status: AC
  Filled 2019-03-15: qty 2

## 2019-03-15 MED ORDER — AMIODARONE HCL IN DEXTROSE 360-4.14 MG/200ML-% IV SOLN
60.0000 mg/h | INTRAVENOUS | Status: DC
  Administered 2019-03-15: 30 mg/h via INTRAVENOUS
  Filled 2019-03-15 (×4): qty 200

## 2019-03-15 MED ORDER — POTASSIUM CHLORIDE 2 MEQ/ML IV SOLN
80.0000 meq | INTRAVENOUS | Status: DC
  Filled 2019-03-15: qty 40

## 2019-03-15 MED ORDER — FENTANYL CITRATE (PF) 100 MCG/2ML IJ SOLN
INTRAMUSCULAR | Status: DC | PRN
  Administered 2019-03-15: 25 ug via INTRAVENOUS

## 2019-03-15 MED ORDER — DEXMEDETOMIDINE HCL IN NACL 400 MCG/100ML IV SOLN
0.1000 ug/kg/h | INTRAVENOUS | Status: AC
  Administered 2019-03-16: 14:00:00 .7 ug/kg/h via INTRAVENOUS
  Filled 2019-03-15: qty 100

## 2019-03-15 MED ORDER — AMIODARONE LOAD VIA INFUSION
150.0000 mg | Freq: Once | INTRAVENOUS | Status: AC
  Administered 2019-03-15: 150 mg via INTRAVENOUS
  Filled 2019-03-15: qty 83.34

## 2019-03-15 MED ORDER — TEMAZEPAM 15 MG PO CAPS: 15.0000 mg | ORAL_CAPSULE | Freq: Once | ORAL | Status: DC | PRN

## 2019-03-15 MED ORDER — TRANEXAMIC ACID (OHS) PUMP PRIME SOLUTION
2.0000 mg/kg | INTRAVENOUS | Status: DC
  Filled 2019-03-15 (×2): qty 1.78

## 2019-03-15 MED ORDER — CHLORHEXIDINE GLUCONATE 4 % EX LIQD
60.0000 mL | Freq: Once | CUTANEOUS | Status: DC
  Filled 2019-03-15: qty 60

## 2019-03-15 MED ORDER — FUROSEMIDE 10 MG/ML IJ SOLN
40.0000 mg | Freq: Two times a day (BID) | INTRAMUSCULAR | Status: DC
  Administered 2019-03-15: 40 mg via INTRAVENOUS
  Filled 2019-03-15: qty 4

## 2019-03-15 MED ORDER — METOPROLOL TARTRATE 12.5 MG HALF TABLET
12.5000 mg | ORAL_TABLET | Freq: Once | ORAL | Status: AC
  Administered 2019-03-16: 12.5 mg via ORAL
  Filled 2019-03-15: qty 1

## 2019-03-15 MED ORDER — PLASMA-LYTE 148 IV SOLN
INTRAVENOUS | Status: DC
  Filled 2019-03-15 (×3): qty 2.5

## 2019-03-15 MED ORDER — INSULIN REGULAR(HUMAN) IN NACL 100-0.9 UT/100ML-% IV SOLN
INTRAVENOUS | Status: AC
  Administered 2019-03-16: 11:00:00 1 [IU]/h via INTRAVENOUS
  Filled 2019-03-15: qty 100

## 2019-03-15 MED ORDER — LABETALOL HCL 5 MG/ML IV SOLN: 10.0000 mg | INTRAVENOUS | Status: AC | PRN

## 2019-03-15 MED ORDER — FUROSEMIDE 10 MG/ML IJ SOLN
80.0000 mg | Freq: Once | INTRAMUSCULAR | Status: AC
  Administered 2019-03-15: 80 mg via INTRAVENOUS

## 2019-03-15 MED ORDER — HEPARIN SODIUM (PORCINE) 1000 UNIT/ML IJ SOLN
INTRAMUSCULAR | Status: AC
  Filled 2019-03-15: qty 1

## 2019-03-15 MED ORDER — HYDRALAZINE HCL 20 MG/ML IJ SOLN: 10.0000 mg | INTRAMUSCULAR | Status: AC | PRN

## 2019-03-15 MED ORDER — NITROGLYCERIN IN D5W 200-5 MCG/ML-% IV SOLN
2.0000 ug/min | INTRAVENOUS | Status: DC
  Filled 2019-03-15: qty 250

## 2019-03-15 MED ORDER — HEPARIN SODIUM (PORCINE) 1000 UNIT/ML IJ SOLN
INTRAMUSCULAR | Status: DC | PRN
  Administered 2019-03-15: 4500 [IU] via INTRAVENOUS

## 2019-03-15 MED ORDER — TRANEXAMIC ACID (OHS) BOLUS VIA INFUSION
15.0000 mg/kg | INTRAVENOUS | Status: AC
  Administered 2019-03-16: 11:00:00 1335 mg via INTRAVENOUS
  Filled 2019-03-15: qty 1335

## 2019-03-15 MED ORDER — DIAZEPAM 2 MG PO TABS
2.0000 mg | ORAL_TABLET | Freq: Once | ORAL | Status: AC
  Administered 2019-03-16: 2 mg via ORAL
  Filled 2019-03-15: qty 1

## 2019-03-15 MED ORDER — MIDAZOLAM HCL 2 MG/2ML IJ SOLN
INTRAMUSCULAR | Status: DC | PRN
  Administered 2019-03-15: 1 mg via INTRAVENOUS

## 2019-03-15 MED ORDER — VANCOMYCIN HCL 1500 MG/300ML IV SOLN
1500.0000 mg | INTRAVENOUS | Status: DC
  Filled 2019-03-15: qty 300

## 2019-03-15 MED ORDER — LIDOCAINE HCL (PF) 1 % IJ SOLN
INTRAMUSCULAR | Status: DC | PRN
  Administered 2019-03-15: 3 mL
  Administered 2019-03-15: 1 mL

## 2019-03-15 MED ORDER — ATORVASTATIN CALCIUM 80 MG PO TABS
80.0000 mg | ORAL_TABLET | Freq: Every day | ORAL | Status: DC
  Administered 2019-03-15: 80 mg via ORAL
  Filled 2019-03-15: qty 1

## 2019-03-15 MED ORDER — SODIUM CHLORIDE 0.9% FLUSH: 3.0000 mL | INTRAVENOUS | Status: DC | PRN

## 2019-03-15 MED ORDER — IOHEXOL 350 MG/ML SOLN
INTRAVENOUS | Status: DC | PRN
  Administered 2019-03-15: 75 mL

## 2019-03-15 MED ORDER — LIDOCAINE HCL (PF) 1 % IJ SOLN
INTRAMUSCULAR | Status: AC
  Filled 2019-03-15: qty 30

## 2019-03-15 MED ORDER — PHENYLEPHRINE HCL-NACL 20-0.9 MG/250ML-% IV SOLN
30.0000 ug/min | INTRAVENOUS | Status: DC
  Administered 2019-03-16: 75 ug/min via INTRAVENOUS
  Filled 2019-03-15: qty 250

## 2019-03-15 MED ORDER — BISACODYL 5 MG PO TBEC
5.0000 mg | DELAYED_RELEASE_TABLET | Freq: Once | ORAL | Status: AC
  Administered 2019-03-15: 5 mg via ORAL
  Filled 2019-03-15: qty 1

## 2019-03-15 MED ORDER — SODIUM CHLORIDE 0.9 % IV SOLN
750.0000 mg | INTRAVENOUS | Status: AC
  Administered 2019-03-16: 16:00:00 750 mg via INTRAVENOUS
  Filled 2019-03-15: qty 750

## 2019-03-15 MED ORDER — VERAPAMIL HCL 2.5 MG/ML IV SOLN
INTRAVENOUS | Status: DC | PRN
  Administered 2019-03-15: 10 mL via INTRA_ARTERIAL

## 2019-03-15 MED ORDER — VERAPAMIL HCL 2.5 MG/ML IV SOLN
INTRAVENOUS | Status: AC
  Filled 2019-03-15: qty 2

## 2019-03-15 MED ORDER — HEPARIN (PORCINE) IN NACL 1000-0.9 UT/500ML-% IV SOLN
INTRAVENOUS | Status: AC
  Filled 2019-03-15: qty 1000

## 2019-03-15 MED ORDER — HEPARIN (PORCINE) 25000 UT/250ML-% IV SOLN
1200.0000 [IU]/h | INTRAVENOUS | Status: DC
  Administered 2019-03-15: 1200 [IU]/h via INTRAVENOUS
  Filled 2019-03-15: qty 250

## 2019-03-15 MED ORDER — SODIUM CHLORIDE 0.9% FLUSH: 3.0000 mL | Freq: Two times a day (BID) | INTRAVENOUS | Status: DC

## 2019-03-15 MED ORDER — SODIUM CHLORIDE 0.9 % IV SOLN
INTRAVENOUS | Status: DC
  Filled 2019-03-15: qty 30

## 2019-03-15 MED ORDER — TRANEXAMIC ACID 1000 MG/10ML IV SOLN
1.5000 mg/kg/h | INTRAVENOUS | Status: AC
  Administered 2019-03-16: 11:00:00 1.5 mg/kg/h via INTRAVENOUS
  Filled 2019-03-15 (×2): qty 25

## 2019-03-15 MED ORDER — SODIUM CHLORIDE 0.9 % IV SOLN: INTRAVENOUS | Status: AC

## 2019-03-15 MED ORDER — MILRINONE LACTATE IN DEXTROSE 20-5 MG/100ML-% IV SOLN
0.3000 ug/kg/min | INTRAVENOUS | Status: AC
  Administered 2019-03-16: .25 ug/kg/min via INTRAVENOUS
  Filled 2019-03-15: qty 100

## 2019-03-15 MED ORDER — MIDAZOLAM HCL 2 MG/2ML IJ SOLN
INTRAMUSCULAR | Status: AC
  Filled 2019-03-15: qty 2

## 2019-03-15 MED ORDER — CHLORHEXIDINE GLUCONATE 0.12 % MT SOLN
15.0000 mL | Freq: Once | OROMUCOSAL | Status: AC
  Administered 2019-03-16: 15 mL via OROMUCOSAL
  Filled 2019-03-15: qty 15

## 2019-03-15 MED ORDER — FUROSEMIDE 10 MG/ML IJ SOLN
40.0000 mg | Freq: Two times a day (BID) | INTRAMUSCULAR | Status: DC
  Administered 2019-03-16: 40 mg via INTRAVENOUS
  Filled 2019-03-15 (×2): qty 4

## 2019-03-15 MED ORDER — MAGNESIUM SULFATE 50 % IJ SOLN
40.0000 meq | INTRAMUSCULAR | Status: DC
  Filled 2019-03-15: qty 9.85

## 2019-03-15 MED ORDER — EPINEPHRINE PF 1 MG/ML IJ SOLN
0.0000 ug/min | INTRAVENOUS | Status: AC
  Administered 2019-03-16: 15:00:00 3 ug/min via INTRAVENOUS
  Filled 2019-03-15 (×3): qty 4

## 2019-03-15 MED ORDER — FUROSEMIDE 10 MG/ML IJ SOLN
INTRAMUSCULAR | Status: AC
  Filled 2019-03-15: qty 8

## 2019-03-15 MED ORDER — AMIODARONE HCL IN DEXTROSE 360-4.14 MG/200ML-% IV SOLN
60.0000 mg/h | INTRAVENOUS | Status: AC
  Administered 2019-03-15: 60 mg/h via INTRAVENOUS
  Filled 2019-03-15: qty 200

## 2019-03-15 MED ORDER — HEPARIN (PORCINE) IN NACL 1000-0.9 UT/500ML-% IV SOLN
INTRAVENOUS | Status: DC | PRN
  Administered 2019-03-15: 500 mL

## 2019-03-15 MED ORDER — SODIUM CHLORIDE 0.9 % IV SOLN: 250.0000 mL | INTRAVENOUS | Status: DC | PRN

## 2019-03-15 SURGICAL SUPPLY — 13 items
CATH 5FR JL3.5 JR4 ANG PIG MP (CATHETERS) ×1 IMPLANT
CATH BALLN WEDGE 5F 110CM (CATHETERS) ×1 IMPLANT
DEVICE RAD COMP TR BAND LRG (VASCULAR PRODUCTS) ×1 IMPLANT
GLIDESHEATH SLEND SS 6F .021 (SHEATH) ×1 IMPLANT
GUIDEWIRE .025 260CM (WIRE) ×1 IMPLANT
GUIDEWIRE INQWIRE 1.5J.035X260 (WIRE) IMPLANT
INQWIRE 1.5J .035X260CM (WIRE) ×2
KIT HEART LEFT (KITS) ×2 IMPLANT
PACK CARDIAC CATHETERIZATION (CUSTOM PROCEDURE TRAY) ×2 IMPLANT
SHEATH GLIDE SLENDER 4/5FR (SHEATH) ×1 IMPLANT
SHEATH PROBE COVER 6X72 (BAG) ×1 IMPLANT
TRANSDUCER W/STOPCOCK (MISCELLANEOUS) ×2 IMPLANT
TUBING CIL FLEX 10 FLL-RA (TUBING) ×2 IMPLANT

## 2019-03-15 NOTE — Interval H&P Note (Signed)
History and Physical Interval Note:  03/15/2019 7:37 AM  Jesse Murphy  has presented today for surgery, with the diagnosis of cardiomyopathy.  The various methods of treatment have been discussed with the patient and family. After consideration of risks, benefits and other options for treatment, the patient has consented to  Procedure(s): RIGHT/LEFT HEART CATH AND CORONARY ANGIOGRAPHY (N/A) as a surgical intervention.  The patient's history has been reviewed, patient examined, no change in status, stable for surgery.  I have reviewed the patient's chart and labs.  Questions were answered to the patient's satisfaction.    Cath Lab Visit (complete for each Cath Lab visit)  Clinical Evaluation Leading to the Procedure:   ACS: Yes.    Non-ACS:    Anginal Classification: CCS III  Anti-ischemic medical therapy: Minimal Therapy (1 class of medications)  Non-Invasive Test Results: No non-invasive testing performed  Prior CABG: No previous CABG        Lauree Chandler

## 2019-03-15 NOTE — Consult Note (Signed)
[address]       [place] [address]             [phone]        Ledford D Odland [place] Medical Record #[id number] Date of Birth: May 08, 1928  Referring: No ref. provider found Primary Care: Sandi Mealy, MD Primary Cardiologist:Suresh Bronson Ing, MD  Chief Complaint:   shortness of breath , chest pain Chief Complaint  Patient presents with  . Tachycardia  Patient examined, images of coronary angiogram and echocardiogram personally reviewed and counseled with patient.  History of Present Illness:     86 year old male recently diagnosed with severe left main and three-vessel coronary disease and moderate LV dysfunction.  He presented to the [facility], ED with shortness of breath chest discomfort and atrial fibrillation with heart rate greater than 170.  DC cardioversion was attempted in the emergency department unsuccessfully.  Echocardiogram showed EF of 30%, previously 55% 2 years ago.  There is mild MR and TR.  Cardiac enzymes were mildly elevated and he was transferred to this hospital for cardiac catheterization.  Patient has had recent episodes of exertional chest tightness and shortness of breath for the past few weeks.  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  He has had chronic atrial fibrillation on Xarelto for years.  He bruises but has had no significant bleeding episodes.  He is a primary caregiver of his wife.  Patient underwent left heart cath today demonstrating significant left main and three-vessel coronary disease.  LVEDP was elevated.  Formal right heart cath not performed. 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   Zubrod Score: At the time of surgery this patient's most appropriate activity status/level should be described as: []     0    Normal activity, no symptoms [x]     1    Restricted in physical strenuous activity but ambulatory, able to do out  light work []     2    Ambulatory and capable of self care, unable to do work activities, up and about                 more than 50%  Of the time                            []     3    Only limited self care, in bed greater than 50% of waking hours []     4    Completely disabled, no self care, confined to bed or chair []     5    Moribund  Past Medical History:  Diagnosis Date  . Atrial fibrillation ([place])   . CAD (coronary artery disease)    a. s/p PCI in 1992 b. Coronary CT in 01/2018 showing extensive coronary calcification; FFR indeterminate --> medical management pursued at that time as patient not interested in repeat cath  . Cancer [facility])    prostate  . Dyspnea   . Hypertension     Past Surgical History:  Procedure Laterality Date  . COLONOSCOPY N/A 08/25/2017   Procedure: COLONOSCOPY;  Surgeon: Rogene Houston, MD;  Location: [facility];  Service: Endoscopy;  Laterality: N/A;  . HERNIA REPAIR    . PROSTATE SURGERY    . RIGHT/LEFT HEART CATH AND CORONARY ANGIOGRAPHY N/A 03/15/2019   Procedure: RIGHT/LEFT HEART CATH  AND CORONARY ANGIOGRAPHY;  Surgeon: Burnell Blanks, MD;  Location: [facility] CV LAB;  Service: Cardiovascular;  Laterality: N/A;    Social History   Tobacco Use  Smoking Status Former Smoker  . Packs/day: 0.25  . Years: 50.00  . Pack years: 12.50  . Types: Cigarettes  . Quit date: 02/15/1990  . Years since quitting: 29.0  Smokeless Tobacco Never Used    Social History   Substance and Sexual Activity  Alcohol Use No     No Known Allergies  Current Facility-Administered Medications  Medication Dose Route Frequency Provider Last Rate Last Admin  . 0.9 %  sodium chloride infusion  250 mL Intravenous PRN Lauree Chandler D, MD      . 0.9 %  sodium chloride infusion  250 mL Intravenous PRN Burnell Blanks, MD      . acetaminophen (TYLENOL) tablet 650 mg  650 mg Oral Q6H PRN Burnell Blanks, MD   650 mg at 03/14/19 1141   Or    . acetaminophen (TYLENOL) suppository 650 mg  650 mg Rectal Q6H PRN Burnell Blanks, MD      . amiodarone (NEXTERONE PREMIX) 360-4.14 MG/200ML-% (1.8 mg/mL) IV infusion  60 mg/hr Intravenous Continuous Furth, Cadence H, PA-C 33.3 mL/hr at 03/15/19 1632 60 mg/hr at 03/15/19 1632   Followed by  . amiodarone (NEXTERONE PREMIX) 360-4.14 MG/200ML-% (1.8 mg/mL) IV infusion  30 mg/hr Intravenous Continuous Furth, Cadence H, PA-C      . aspirin EC tablet 81 mg  81 mg Oral Daily Burnell Blanks, MD   81 mg at 03/14/19 0900  . atorvastatin (LIPITOR) tablet 80 mg  80 mg Oral q1800 Furth, Cadence H, PA-C      . bisacodyl (DULCOLAX) EC tablet 5 mg  5 mg Oral Once Prescott Gum, Collier Salina, MD      . Derrill Memo ON 03/16/2019] cefUROXime (ZINACEF) 1.5 g in sodium chloride 0.9 % 100 mL IVPB  1.5 g Intravenous To OR Prescott Gum, Collier Salina, MD      . Derrill Memo ON 03/16/2019] cefUROXime (ZINACEF) 750 mg in sodium chloride 0.9 % 100 mL IVPB  750 mg Intravenous To OR Prescott Gum, Collier Salina, MD      . chlorhexidine (HIBICLENS) 4 % liquid 4 application  60 mL Topical Once Prescott Gum, Collier Salina, MD       And  . Derrill Memo ON 03/16/2019] chlorhexidine (HIBICLENS) 4 % liquid 4 application  60 mL Topical Once Prescott Gum, Collier Salina, MD      . Derrill Memo ON 03/16/2019] chlorhexidine (PERIDEX) 0.12 % solution 15 mL  15 mL Mouth/Throat Once Prescott Gum, Collier Salina, MD      . Chlorhexidine Gluconate Cloth 2 % PADS 6 each  6 each Topical Daily Burnell Blanks, MD   6 each at 03/15/19 1247  . [START ON 03/16/2019] dexmedetomidine (PRECEDEX) 400 MCG/100ML (4 mcg/mL) infusion  0.1-0.7 mcg/kg/hr Intravenous To OR Prescott Gum, Collier Salina, MD      . Derrill Memo ON 03/16/2019] diazepam (VALIUM) tablet 2 mg  2 mg Oral Once Prescott Gum, Collier Salina, MD      . Derrill Memo ON 03/16/2019] EPINEPHrine (ADRENALIN) 4 mg in dextrose 5 % 250 mL (0.016 mg/mL) infusion  0-10 mcg/min Intravenous To OR Prescott Gum, Collier Salina, MD      . folic acid (FOLVITE) tablet 1 mg  1 mg Oral Daily Burnell Blanks, MD   1  mg at 03/14/19 0900  . furosemide (LASIX) 10 MG/ML injection           .  furosemide (LASIX) injection 40 mg  40 mg Intravenous Q12H Cheryln Manly, NP      . Derrill Memo ON 03/16/2019] heparin 2,500 Units, papaverine 30 mg in electrolyte-148 (PLASMALYTE-148) 500 mL irrigation   Irrigation To OR Prescott Gum, Collier Salina, MD      . Derrill Memo ON 03/16/2019] heparin 30,000 units/NS 1000 mL solution for CELLSAVER   Other To OR Prescott Gum, Collier Salina, MD      . heparin ADULT infusion 100 units/mL (25000 units/265mL sodium chloride 0.45%)  1,200 Units/hr Intravenous Continuous Lyndee Leo, RPH      . insulin aspart (novoLOG) injection 0-5 Units  0-5 Units Subcutaneous QHS Lauree Chandler D, MD      . insulin aspart (novoLOG) injection 0-9 Units  0-9 Units Subcutaneous TID WC Burnell Blanks, MD   1 Units at 03/14/19 1141  . [START ON 03/16/2019] insulin regular, human (MYXREDLIN) 100 units/ 100 mL infusion   Intravenous To OR Prescott Gum, Collier Salina, MD      . loratadine (CLARITIN) tablet 10 mg  10 mg Oral Daily Burnell Blanks, MD   10 mg at 03/14/19 0900  . [START ON 03/16/2019] magnesium sulfate (IV Push/IM) injection 40 mEq  40 mEq Other To OR Prescott Gum, Collier Salina, MD      . metoprolol tartrate (LOPRESSOR) injection 5 mg  5 mg Intravenous Q6H PRN Burnell Blanks, MD   5 mg at 03/14/19 2258  . [START ON 03/16/2019] metoprolol tartrate (LOPRESSOR) tablet 12.5 mg  12.5 mg Oral Once Prescott Gum, Collier Salina, MD      . metoprolol tartrate (LOPRESSOR) tablet 50 mg  50 mg Oral Q8H Burnell Blanks, MD   50 mg at 03/15/19 0430  . [START ON 03/16/2019] milrinone (PRIMACOR) 20 MG/100 ML (0.2 mg/mL) infusion  0.3 mcg/kg/min Intravenous To OR Prescott Gum, Collier Salina, MD      . multivitamin with minerals tablet 1 tablet  1 tablet Oral Daily Burnell Blanks, MD   1 tablet at 03/14/19 0900  . [START ON 03/16/2019] nitroGLYCERIN 50 mg in dextrose 5 % 250 mL (0.2 mg/mL) infusion  2-200 mcg/min Intravenous To OR Prescott Gum,  Collier Salina, MD      . Derrill Memo ON 03/16/2019] norepinephrine (LEVOPHED) 4mg  in 212mL premix infusion  0-40 mcg/min Intravenous To OR Prescott Gum, Collier Salina, MD      . ondansetron [facility]) tablet 4 mg  4 mg Oral Q6H PRN Burnell Blanks, MD       Or  . ondansetron (ZOFRAN) injection 4 mg  4 mg Intravenous Q6H PRN Burnell Blanks, MD      . pantoprazole (PROTONIX) EC tablet 40 mg  40 mg Oral Daily Burnell Blanks, MD   40 mg at 03/14/19 0900  . [START ON 03/16/2019] phenylephrine (NEOSYNEPHRINE) 20-0.9 MG/250ML-% infusion  30-200 mcg/min Intravenous To OR Prescott Gum, Collier Salina, MD      . polyethylene glycol (MIRALAX / GLYCOLAX) packet 17 g  17 g Oral Daily PRN Burnell Blanks, MD      . Derrill Memo ON 03/16/2019] potassium chloride injection 80 mEq  80 mEq Other To OR Prescott Gum, Collier Salina, MD      . sodium chloride flush (NS) 0.9 % injection 3 mL  3 mL Intravenous Q12H Burnell Blanks, MD   3 mL at 03/14/19 0856  . sodium chloride flush (NS) 0.9 % injection 3 mL  3 mL Intravenous PRN Burnell Blanks, MD      . sodium chloride flush (  NS) 0.9 % injection 3 mL  3 mL Intravenous Q12H Burnell Blanks, MD   3 mL at 03/14/19 2046  . sodium chloride flush (NS) 0.9 % injection 3 mL  3 mL Intravenous Q12H Lauree Chandler D, MD      . sodium chloride flush (NS) 0.9 % injection 3 mL  3 mL Intravenous PRN Burnell Blanks, MD      . tamsulosin (FLOMAX) capsule 0.4 mg  0.4 mg Oral QPC supper Burnell Blanks, MD   0.4 mg at 03/14/19 1803  . temazepam (RESTORIL) capsule 15 mg  15 mg Oral Once PRN Prescott Gum, Collier Salina, MD      . thiamine tablet 100 mg  100 mg Oral Daily Burnell Blanks, MD   100 mg at 03/14/19 0900  . [START ON 03/16/2019] tranexamic acid (CYKLOKAPRON) 2,500 mg in sodium chloride 0.9 % 250 mL (10 mg/mL) infusion  1.5 mg/kg/hr Intravenous To OR Prescott Gum, Collier Salina, MD      . Derrill Memo ON 03/16/2019] tranexamic acid (CYKLOKAPRON) bolus via infusion - over 30  minutes 1,335 mg  15 mg/kg Intravenous To OR Prescott Gum, Collier Salina, MD      . Derrill Memo ON 03/16/2019] tranexamic acid (CYKLOKAPRON) pump prime solution 178 mg  2 mg/kg Intracatheter To OR Prescott Gum, Collier Salina, MD      . traZODone (DESYREL) tablet 50 mg  50 mg Oral QHS PRN Burnell Blanks, MD      . Derrill Memo ON 03/16/2019] vancomycin (VANCOREADY) IVPB 1500 mg/300 mL  1,500 mg Intravenous To OR Ivin Poot, MD        Medications Prior to Admission  Medication Sig Dispense Refill Last Dose  . acetaminophen (TYLENOL) 325 MG tablet Take 325 mg by mouth every 8 (eight) hours as needed for moderate pain.      Marland Kitchen atorvastatin (LIPITOR) 10 MG tablet Take 10 mg by mouth.      . fluticasone (FLONASE) 50 MCG/ACT nasal spray daily as needed.      . furosemide (LASIX) 20 MG tablet Take 20 mg by mouth daily.      Marland Kitchen loratadine (CLARITIN) 10 MG tablet Take 10 mg by mouth daily.     . metoprolol tartrate (LOPRESSOR) 50 MG tablet Take 1 tablet (50 mg total) by mouth 2 (two) times daily. 180 tablet 3   . pantoprazole (PROTONIX) 40 MG tablet Take 1 tablet (40 mg total) by mouth daily. 30 tablet 0   . Pediatric Multivitamins-Iron (FLINTSTONES PLUS IRON) chewable tablet Chew 1 tablet by mouth 2 (two) times daily.     . rivaroxaban (XARELTO) 20 MG TABS tablet Take 1 tablet (20 mg total) by mouth daily with supper.   03/13/2019 at 1830  . tamsulosin (FLOMAX) 0.4 MG CAPS capsule Take by mouth.       Family History  Problem Relation Age of Onset  . CAD Mother 31  . CAD Father 66  . CAD Sister   . 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 Patient had previous prostate cancer treated with seed therapy. Patient denies difficulty voiding or significant nocturia. Patient denies history of previous thoracic trauma pneumothorax or rib fractures.   Cardiac Review of Systems: Y or  [    ]= no  Chest Pain Tot[place].Blacker    ]  Resting SOB  [yes] Exertional SOB  [yes]  Orthopnea [  ]  Pedal Edema [yes]    Palpitations [  ] Syncope  [  ]   Presyncope [   ]  General Review of Systems: [Y] = yes [  ]=no Constitional: recent weight change [  ]; anorexia [  ]; fatigue [yes]; nausea [  ]; night sweats [  ]; fever [  ]; or chills [  ]                                                               Dental: Last Dentist visit: Greater than 1 year  Eye : blurred vision [  ]; diplopia [   ]; vision changes [  ];  Amaurosis fugax[  ]; Resp: cough [  ];  wheezing[  ];  hemoptysis[  ]; shortness of breath[yes]; paroxysmal nocturnal dyspnea[  ]; dyspnea on exertion[  ]; or orthopnea[  ];  GI:  gallstones[  ], vomiting[  ];  dysphagia[  ]; melena[  ];  hematochezia [  ]; heartburn[  ];   Hx of  Colonoscopy[  ]; GU: kidney stones [  ]; hematuria[  ];   dysuria [  ];  nocturia[  ];  history of     obstruction [  ]; urinary frequency [  ]             Skin: rash, swelling[  ];, hair loss[  ];  peripheral edema[  ];  or itching[  ]; Musculosketetal: myalgias[  ];  joint swelling[  ];  joint erythema[  ];  joint pain[  ];  back pain[  ];  Heme/Lymph: bruising[  ];  bleeding[  ];  anemia[  ];  Neuro: TIA[  ];  headaches[  ];  stroke[  ];  vertigo[  ];  seizures[  ];   paresthesias[  ];  difficulty walking[  ];  Psych:depression[  ]; anxiety[  ];  Endocrine: diabetes[  ];  thyroid dysfunction[  ];            Last dose of Xarelto was taken on January 26 at [facility].  He has been on heparin since then for his atrial fibrillation.             Physical Exam: BP 122/76   Pulse (!) 102   Temp 97.8 F (36.6 C) (Oral)   Resp 18   Ht 5\' 8"  (1.727 m)   Wt 89 kg Comment: scale a  SpO2 94%   BMI 29.83 kg/m        Exam    General- alert and comfortable elderly male short of breath with conversation.  No bleeding from his right radial compression band    Neck- no JVD, no cervical adenopathy palpable, no carotid bruit   Lungs- clear with  basilar rales, scattered rhonchi and wheezes   Cor- irregular rate and rhythm, no murmur , gallop   Abdomen- soft, non-tender   Extremities - warm, non-tender, minimal edema   Neuro- oriented, appropriate, no focal weakness    Diagnostic Studies & Laboratory data:     Recent Radiology Findings:   CARDIAC CATHETERIZATION  Result Date: 03/15/2019  Dist LM to Ost LAD lesion is 80% stenosed.  Ost LM to Dist LM lesion is 50% stenosed.  Mid Cx lesion is 30% stenosed.  Prox RCA-1 lesion is 60% stenosed.  Prox RCA-2 lesion is 99% stenosed.  1. Severe calcific stenosis in the distal left main, ostial LAD and mid RCA 2. Elevated filling pressures Recommendations: He has a severe, heavily calcified stenosis in the mid RCA. He also has a heavily calcified distal left main, ostial LAD stenosis. Revascularization with PCI would be high risk and require atherectomy in the ostial LAD extending back into the left main with chance of obstruction of flow into the Circumflex. He is 86 years old and would be high risk for CABG as well but he is overall a very functional patient for his age. I will ask CT surgery to see him to discuss CABG. He will need optimization of his heart rate control and diuresis. I will start Lasix 40 mg IV BID.   VAS US DOPPLER PRE CABG  Result Date: 03/15/2019 PREOPERATIVE VASCULAR EVALUATION  Indications:      Pre-CABG. Risk Factors:     Hypertension, hyperlipidemia, coronary artery disease. Other Factors:    A. Fib. Limitations:      Irregular heartbeat. Comparison Study: No prior study. Performing Technologist: Baldwin Crown RVT, RDMS  Examination Guidelines: A complete evaluation includes B-mode imaging, spectral Doppler, color Doppler, and power Doppler as needed of all accessible portions of each vessel. Bilateral testing is considered an integral part of a complete examination. Limited examinations for reoccurring indications may be performed as noted.  Right Carotid Findings:  +----------+--------+--------+--------+------------+--------+           PSV cm/sEDV cm/sStenosisDescribe    Comments +----------+--------+--------+--------+------------+--------+ CCA Prox  69      16              heterogenous         +----------+--------+--------+--------+------------+--------+ CCA Distal50      20                                   +----------+--------+--------+--------+------------+--------+ ICA Prox  81      25      1-39%   heterogenous         +----------+--------+--------+--------+------------+--------+ ICA Distal102     28                                   +----------+--------+--------+--------+------------+--------+ ECA       128     18                                   +----------+--------+--------+--------+------------+--------+ +----------+--------+-------+----------------+------------+           PSV cm/sEDV cmsDescribe        Arm Pressure +----------+--------+-------+----------------+------------+ [id number]            Multiphasic, WNL             +----------+--------+-------+----------------+------------+ +---------+--------+--+--------+-+---------+ VertebralPSV cm/s47EDV cm/s9Antegrade +---------+--------+--+--------+-+---------+ Left Carotid Findings: +----------+--------+--------+--------+------------+------------------+           PSV cm/sEDV cm/sStenosisDescribe    Comments           +----------+--------+--------+--------+------------+------------------+ CCA Prox  75      20              heterogenousintimal thickening +----------+--------+--------+--------+------------+------------------+ CCA Distal56      15                                             +----------+--------+--------+--------+------------+------------------+  ICA Prox  66      22      1-39%   heterogenous                   +----------+--------+--------+--------+------------+------------------+ ICA Distal67      22                                              +----------+--------+--------+--------+------------+------------------+ ECA       125     13                                             +----------+--------+--------+--------+------------+------------------+ +----------+--------+--------+----------------+------------+ SubclavianPSV cm/sEDV cm/sDescribe        Arm Pressure +----------+--------+--------+----------------+------------+           183             Multiphasic, WNL             +----------+--------+--------+----------------+------------+ +---------+--------+--+--------+--+---------+ VertebralPSV cm/s55EDV cm/s18Antegrade +---------+--------+--+--------+--+---------+  ABI Findings: +--------+------------------+-----+---------+--------+ Right   Rt Pressure (mmHg)IndexWaveform Comment  +--------+------------------+-----+---------+--------+ Brachial                       triphasic         +--------+------------------+-----+---------+--------+ PTA                            triphasic         +--------+------------------+-----+---------+--------+ DP                             triphasic         +--------+------------------+-----+---------+--------+ +--------+------------------+-----+---------+-------+ Left    Lt Pressure (mmHg)IndexWaveform Comment +--------+------------------+-----+---------+-------+ Brachial                       triphasic        +--------+------------------+-----+---------+-------+ PTA                            triphasic        +--------+------------------+-----+---------+-------+ DP                             triphasic        +--------+------------------+-----+---------+-------+  Right Doppler Findings: +--------+--------+-----+---------+--------+ Site    PressureIndexDoppler  Comments +--------+--------+-----+---------+--------+ Brachial             triphasic         +--------+--------+-----+---------+--------+ Radial                triphasic         +--------+--------+-----+---------+--------+ Ulnar                triphasic         +--------+--------+-----+---------+--------+  Left Doppler Findings: +--------+--------+-----+---------+--------+ Site    PressureIndexDoppler  Comments +--------+--------+-----+---------+--------+ Brachial             triphasic         +--------+--------+-----+---------+--------+ Radial               triphasic         +--------+--------+-----+---------+--------+ Ulnar  triphasic         +--------+--------+-----+---------+--------+  Summary: Right Carotid: Velocities in the right ICA are consistent with a 1-39% stenosis.                The extracranial vessels were near-normal with only minimal wall                thickening or plaque. Left Carotid: Velocities in the left ICA are consistent with a 1-39% stenosis.               The extracranial vessels were near-normal with only minimal wall               thickening or plaque. Vertebrals:  Bilateral vertebral arteries demonstrate antegrade flow. Subclavians: Normal flow hemodynamics were seen in bilateral subclavian              arteries. Right Upper Extremity: Doppler waveforms remain within normal limits with right radial compression. Doppler waveforms decrease >50% with right ulnar compression. Left Upper Extremity: Doppler waveforms decrease >50% with left radial compression. Doppler waveforms decrease >50% with left ulnar compression.  Electronically signed by Monica Martinez MD on 03/15/2019 at 4:26:22 PM.    Final      I have independently reviewed the above radiologic studies and discussed with the patient   Recent Lab Findings: Lab Results  Component Value Date   WBC 7.9 03/15/2019   HGB 11.9 (L) 03/15/2019   HGB 10.9 (L) 03/15/2019   HCT 35.0 (L) 03/15/2019   HCT 32.0 (L) 03/15/2019   PLT 181 03/15/2019   GLUCOSE 144 (H) 03/15/2019   CHOL 187 09/13/2018   TRIG 139 09/13/2018   HDL  29 (L) 09/13/2018   LDLCALC 130 (H) 09/13/2018   ALT 10 03/15/2019   AST 18 03/15/2019   NA 136 03/15/2019   K 4.1 03/15/2019   CL 100 03/15/2019   CREATININE 1.02 03/15/2019   BUN 15 03/15/2019   CO2 26 03/15/2019   TSH 3.683 03/13/2019   INR 1.2 03/15/2019   HGBA1C 6.1 (H) 03/13/2019      Assessment / Plan:      Severe left main and three-vessel CAD with moderate LV dysfunction EF 30% Rapid atrial fibrillation not responsive to DC cardioversion History of B-cell lymphoma History of prostate cancer Acute systolic heart failure  Plan preparation for multivessel CABG with left atrial maze ablation procedure and left atrial clip.  I discussed the procedure with the patient including benefits and risks and he understands and agrees to proceed with surgery.     @ME1 @ 03/15/2019 5:12 PM

## 2019-03-15 NOTE — Progress Notes (Signed)
Unable to get ABG.  Will forward order to night RRT

## 2019-03-15 NOTE — Progress Notes (Signed)
ANTICOAGULATION CONSULT NOTE  Pharmacy Consult for Xarelto--> Heparin Indication: atrial fibrillation  No Known Allergies  Patient Measurements: Height: 5\' 8"  (172.7 cm) Weight: 196 lb 3.2 oz (89 kg)(scale a) IBW/kg (Calculated) : 68.4 HEPARIN DW (KG): 86.3  Vital Signs: Temp: 97.8 F (36.6 C) (01/28 0914) Temp Source: Oral (01/28 0914) BP: 112/71 (01/28 0914) Pulse Rate: 112 (01/28 0914)  Labs: Recent Labs    03/13/19 1056 03/13/19 1056 03/13/19 1251 03/13/19 2257 03/14/19 0402 03/14/19 1119 03/15/19 0411  HGB 11.3*   < >  --   --  10.5*  --  11.3*  HCT 37.1*  --   --   --  34.4*  --  35.5*  PLT 187  --   --   --  194  --  181  APTT  --   --   --   --   --  48* 76*  HEPARINUNFRC  --   --   --   --   --  >2.20* 0.92*  CREATININE 0.96  --   --   --  1.03  --   --   TROPONINIHS 32*   < > 440* 926* 585*  --   --    < > = values in this interval not displayed.    Estimated Creatinine Clearance: 53.7 mL/min (by C-G formula based on SCr of 1.03 mg/dL).   Medical History: Past Medical History:  Diagnosis Date  . Atrial fibrillation ([place])   . CAD (coronary artery disease)    a. s/p PCI in 1992 b. Coronary CT in 01/2018 showing extensive coronary calcification; FFR indeterminate --> medical management pursued at that time as patient not interested in repeat cath  . Cancer [facility])    prostate  . Dyspnea   . Hypertension     Medications:  Medications Prior to Admission  Medication Sig Dispense Refill Last Dose  . acetaminophen (TYLENOL) 325 MG tablet Take 325 mg by mouth every 8 (eight) hours as needed for moderate pain.      Marland Kitchen atorvastatin (LIPITOR) 10 MG tablet Take 10 mg by mouth.      . fluticasone (FLONASE) 50 MCG/ACT nasal spray daily as needed.      . furosemide (LASIX) 20 MG tablet Take 20 mg by mouth daily.      Marland Kitchen loratadine (CLARITIN) 10 MG tablet Take 10 mg by mouth daily.     . metoprolol tartrate (LOPRESSOR) 50 MG tablet Take 1 tablet (50 mg total) by  mouth 2 (two) times daily. 180 tablet 3   . pantoprazole (PROTONIX) 40 MG tablet Take 1 tablet (40 mg total) by mouth daily. 30 tablet 0   . Pediatric Multivitamins-Iron (FLINTSTONES PLUS IRON) chewable tablet Chew 1 tablet by mouth 2 (two) times daily.     . rivaroxaban (XARELTO) 20 MG TABS tablet Take 1 tablet (20 mg total) by mouth daily with supper.   03/13/2019 at 1830  . tamsulosin (FLOMAX) 0.4 MG CAPS capsule Take by mouth.       Assessment: 86 y.o. male with past medical history of CAD , chronic diastolic CHF, paroxysmal atrial fibrillation, HTN, HLD, follicular lymphoma and history of prostate cancer. He presented to ED with elevated HR and continues to be in afib.  He is chronically anticoagulated with xarelto, last dose 1/26 at 1830. Pharmacy asked to transition to heparin in anticipation of catheterization. Will obtain baseline labs and adjust per APTT since on DOAC until correlates with Heparin levels.  S/p cath found to have single vessel CAD, orders to restart heparin this afternoon after TR band removed. Restart at previous rate, recheck labs in am.   Goal of Therapy:  Heparin level 0.3-0.7 units/ml aPTT 66-102 seconds Monitor platelets by anticoagulation protocol: Yes   Plan:  Restart heparin at 1200 units/hr Daily CBC/HL/aPTT Monitor for bleeding  Erin Hearing PharmD., BCPS Clinical Pharmacist 03/15/2019 10:17 AM

## 2019-03-15 NOTE — Progress Notes (Signed)
ANTICOAGULATION CONSULT NOTE  Pharmacy Consult for Xarelto--> Heparin Indication: atrial fibrillation  No Known Allergies  Patient Measurements: Height: 5\' 8"  (172.7 cm) Weight: 196 lb 3.2 oz (89 kg)(scale a) IBW/kg (Calculated) : 68.4 HEPARIN DW (KG): 86.3  Vital Signs: Temp: 97.5 F (36.4 C) (01/28 0419) Temp Source: Oral (01/27 1924) BP: 120/55 (01/28 0419) Pulse Rate: 121 (01/28 0419)  Labs: Recent Labs    03/13/19 1056 03/13/19 1056 03/13/19 1251 03/13/19 2257 03/14/19 0402 03/14/19 1119 03/15/19 0411  HGB 11.3*   < >  --   --  10.5*  --  11.3*  HCT 37.1*  --   --   --  34.4*  --  35.5*  PLT 187  --   --   --  194  --  181  APTT  --   --   --   --   --  48* 76*  HEPARINUNFRC  --   --   --   --   --  >2.20* 0.92*  CREATININE 0.96  --   --   --  1.03  --   --   TROPONINIHS 32*   < > 440* 926* 585*  --   --    < > = values in this interval not displayed.    Estimated Creatinine Clearance: 53.7 mL/min (by C-G formula based on SCr of 1.03 mg/dL).   Medical History: Past Medical History:  Diagnosis Date  . Atrial fibrillation ([place])   . CAD (coronary artery disease)    a. s/p PCI in 1992 b. Coronary CT in 01/2018 showing extensive coronary calcification; FFR indeterminate --> medical management pursued at that time as patient not interested in repeat cath  . Cancer [facility])    prostate  . Dyspnea   . Hypertension     Medications:  Medications Prior to Admission  Medication Sig Dispense Refill Last Dose  . acetaminophen (TYLENOL) 325 MG tablet Take 325 mg by mouth every 8 (eight) hours as needed for moderate pain.      Marland Kitchen atorvastatin (LIPITOR) 10 MG tablet Take 10 mg by mouth.      . fluticasone (FLONASE) 50 MCG/ACT nasal spray daily as needed.      . furosemide (LASIX) 20 MG tablet Take 20 mg by mouth daily.      Marland Kitchen loratadine (CLARITIN) 10 MG tablet Take 10 mg by mouth daily.     . metoprolol tartrate (LOPRESSOR) 50 MG tablet Take 1 tablet (50 mg total) by  mouth 2 (two) times daily. 180 tablet 3   . pantoprazole (PROTONIX) 40 MG tablet Take 1 tablet (40 mg total) by mouth daily. 30 tablet 0   . Pediatric Multivitamins-Iron (FLINTSTONES PLUS IRON) chewable tablet Chew 1 tablet by mouth 2 (two) times daily.     . rivaroxaban (XARELTO) 20 MG TABS tablet Take 1 tablet (20 mg total) by mouth daily with supper.   03/13/2019 at 1830  . tamsulosin (FLOMAX) 0.4 MG CAPS capsule Take by mouth.       Assessment: 86 y.o. male with past medical history of CAD , chronic diastolic CHF, paroxysmal atrial fibrillation, HTN, HLD, follicular lymphoma and history of prostate cancer. He presented to ED with elevated HR and continues to be in afib.  He is chronically anticoagulated with xarelto, last dose 1/26 at 1830. Pharmacy asked to transition to heparin in anticipation of catheterization. Will obtain baseline labs and adjust per APTT since on DOAC until correlates with Heparin levels.  1/28 AM update:  APTT therapeutic, heparin level elevated as expected due to Xarelto influence  Goal of Therapy:  Heparin level 0.3-0.7 units/ml aPTT 66-102 seconds Monitor platelets by anticoagulation protocol: Yes   Plan:  Cont heparin at 1200 units/hr Confirmatory aPTT at 1200 Daily CBC/HL/aPTT Monitor for bleeding  Narda Bonds, PharmD, BCPS Clinical Pharmacist Phone: [phone]

## 2019-03-15 NOTE — Progress Notes (Signed)
    Called regarding patient with worsening respiratory status. Underwent cardiac cath this morning with LM/LAD and RCA disease. Planned for CABG. Noted to be dyspnea on arrival. Had gotten up to use the urinal. Placed back in bed. Crackles on exam, and tachycardiac.  -- CXR  -- IV lasix 80mg  x1 now -- ABG -- continue IV amiodarone and metoprolol. May need further adjustment if rates remain elevated   Will transfer to progressive care for closer monitoring.  SignedReino Bellis, NP-C 03/15/2019, 4:23 PM Pager: [phone]

## 2019-03-15 NOTE — Progress Notes (Signed)
Pre CABG exam completed.  Preliminary results can be found under CV proc under chart review.  03/15/2019 3:55 PM  Antionette Luster, K., RDMS, RVT

## 2019-03-15 NOTE — Progress Notes (Signed)
Went to pt to do beside PFT study.. Pt in distress at this time.  PFT not done at this time,  Was told to hold on PFT at this time

## 2019-03-15 NOTE — Progress Notes (Signed)
Progress Note  Patient Name: Jesse Murphy Date of Encounter: 03/15/2019  Primary Cardiologist: Kate Sable, MD   Subjective   Cardiac cath today with plan for CT surgery consult. Cath site right radial appears stable. No chest pain but patient does feel short of breath on exertion. Patient remains in Afib with rates up to 130.   Inpatient Medications    Scheduled Meds: . aspirin EC  81 mg Oral Daily  . atorvastatin  40 mg Oral q1800  . Chlorhexidine Gluconate Cloth  6 each Topical Daily  . folic acid  1 mg Oral Daily  . furosemide  40 mg Intravenous Q12H  . insulin aspart  0-5 Units Subcutaneous QHS  . insulin aspart  0-9 Units Subcutaneous TID WC  . loratadine  10 mg Oral Daily  . metoprolol tartrate  50 mg Oral Q8H  . multivitamin with minerals  1 tablet Oral Daily  . pantoprazole  40 mg Oral Daily  . sodium chloride flush  3 mL Intravenous Q12H  . sodium chloride flush  3 mL Intravenous Q12H  . sodium chloride flush  3 mL Intravenous Q12H  . tamsulosin  0.4 mg Oral QPC supper  . thiamine  100 mg Oral Daily   Continuous Infusions: . sodium chloride    . sodium chloride    . sodium chloride     PRN Meds: sodium chloride, sodium chloride, acetaminophen **OR** acetaminophen, hydrALAZINE, labetalol, metoprolol tartrate, ondansetron **OR** ondansetron (ZOFRAN) IV, polyethylene glycol, sodium chloride flush, sodium chloride flush, traZODone   Vital Signs    Vitals:   03/15/19 0419 03/15/19 0727 03/15/19 0807 03/15/19 0914  BP: (!) 120/55 110/89  112/71  Pulse: (!) 121 (!) 111  (!) 112  Resp: 18 18    Temp: (!) 97.5 F (36.4 C) 98.7 F (37.1 C)  97.8 F (36.6 C)  TempSrc:  Oral  Oral  SpO2: 92% 93% 100% 93%  Weight: 89 kg     Height:        Intake/Output Summary (Last 24 hours) at 03/15/2019 1000 Last data filed at 03/15/2019 0729 Gross per 24 hour  Intake 378.47 ml  Output 830 ml  Net -451.53 ml   Last 3 Weights 03/15/2019 03/14/2019 03/14/2019  Weight  (lbs) 196 lb 3.2 oz 194 lb 10.7 oz 196 lb 10.4 oz  Weight (kg) 88.996 kg 88.3 kg 89.2 kg      Telemetry    Afib, HR 100-130 - Personally Reviewed  ECG    Pending - Personally Reviewed  Physical Exam   GEN: No acute distress.   Neck: No JVD Cardiac: RRR, no murmurs, rubs, or gallops.  Respiratory: Clear to auscultation bilaterally. GI: Soft, nontender, non-distended  MS: No edema; No deformity. Neuro:  Nonfocal  Psych: Normal affect   Labs    High Sensitivity Troponin:   Recent Labs  Lab 03/13/19 1056 03/13/19 1251 03/13/19 2257 03/14/19 0402  TROPONINIHS 32* 440* 926* 585*      Chemistry Recent Labs  Lab 03/13/19 1056 03/14/19 0402  NA 138 139  K 3.9 3.7  CL 102 101  CO2 26 29  GLUCOSE 208* 115*  BUN 14 12  CREATININE 0.96 1.03  CALCIUM 8.6* 8.7*  GFRNONAA >60 >60  GFRAA >60 >60  ANIONGAP 10 9     Hematology Recent Labs  Lab 03/13/19 1056 03/14/19 0402 03/15/19 0411  WBC 6.5 6.8 7.9  RBC 3.88* 3.61* 3.86*  HGB 11.3* 10.5* 11.3*  HCT 37.1* 34.4* 35.5*  MCV 95.6 95.3 92.0  MCH 29.1 29.1 29.3  MCHC 30.5 30.5 31.8  RDW 14.4 14.4 14.5  PLT 187 194 181    BNPNo results for input(s): BNP, PROBNP in the last 168 hours.   DDimer No results for input(s): DDIMER in the last 168 hours.   Radiology    CARDIAC CATHETERIZATION  Result Date: 03/15/2019  Dist LM to Ost LAD lesion is 80% stenosed.  Ost LM to Dist LM lesion is 50% stenosed.  Mid Cx lesion is 30% stenosed.  Prox RCA-1 lesion is 60% stenosed.  Prox RCA-2 lesion is 99% stenosed.  1. Severe calcific stenosis in the distal left main, ostial LAD and mid RCA 2. Elevated filling pressures Recommendations: He has a severe, heavily calcified stenosis in the mid RCA. He also has a heavily calcified distal left main, ostial LAD stenosis. Revascularization with PCI would be high risk and require atherectomy in the ostial LAD extending back into the left main with chance of obstruction of flow into  the Circumflex. He is 86 years old and would be high risk for CABG as well but he is overall a very functional patient for his age. I will ask CT surgery to see him to discuss CABG. He will need optimization of his heart rate control and diuresis. I will start Lasix 40 mg IV BID.   DG Chest Port 1 View  Result Date: 03/13/2019 CLINICAL DATA:  Cough, elevated heart rate, history of atrial fibrillation. EXAM: PORTABLE CHEST 1 VIEW COMPARISON:  09/12/2018 FINDINGS: Portable exam with lordotic positioning. Heart size is stable. Leads and pacer defibrillator pads project over the central chest. Mild interstitial prominence is not significantly changed from the study of 09/12/2018. No signs of dense consolidation or evidence of pleural effusion. Visualized skeletal structures are unremarkable. IMPRESSION: Stable mild interstitial prominence, no consolidation or effusion. Multiple leads project over the patient's chest. Electronically Signed   By: Zetta Bills M.D.   On: 03/13/2019 11:09   ECHOCARDIOGRAM COMPLETE  Result Date: 03/13/2019   ECHOCARDIOGRAM REPORT   Patient Name:   Jesse Murphy Date of Exam: 03/13/2019 Medical Rec #:  [id number]    Height:       68.0 in Accession #:    [id number]   Weight:       203.0 lb Date of Birth:  1928-08-28   BSA:          2.06 m Patient Age:    86 years     BP:           103/63 mmHg Patient Gender: M            HR:           105 bpm. Exam Location:  [facility] Procedure: 2D Echo Indications:    Abnormal ECG 794.31 / R94.31  History:        Patient has prior history of Echocardiogram examinations, most                 recent 12/29/2017. CAD, Signs/Symptoms:Chest Pain and Dyspnea;                 Risk Factors:Diabetes, Hypertension and Former Smoker. Atrial                 fibrillation with rapid ventricular response , Non Hodgkin's                 lymphoma , Chronic diastolic heart failure.  Sonographer:    Venetia Night  Vira Agar RDCS (AE) Referring Phys: [id number] COURAGE EMOKPAE  IMPRESSIONS  1. Left ventricular ejection fraction, by visual estimation, is 30 to 35%. The left ventricle has moderate to severely decreased function. There is mildly increased left ventricular hypertrophy.  2. Left ventricular diastolic parameters are indeterminate in the setting of atrial fibrillation.  3. The left ventricle demonstrates global hypokinesis with paradoxical septal motion.  4. Global right ventricle has mildly reduced systolic function.The right ventricular size is normal. No increase in right ventricular wall thickness.  5. Left atrial size was upper normal.  6. Right atrial size was normal.  7. Mild mitral annular calcification.  8. The mitral valve is grossly normal. Trivial mitral valve regurgitation.  9. The tricuspid valve is grossly normal. Tricuspid valve regurgitation is trivial. 10. The aortic valve is tricuspid. Aortic valve regurgitation is not visualized. Mild aortic valve sclerosis without stenosis. 11. The pulmonic valve was grossly normal. Pulmonic valve regurgitation is trivial. 12. TR signal is inadequate for assessing pulmonary artery systolic pressure. 13. The inferior vena cava is dilated in size with <50% respiratory variability, suggesting right atrial pressure of 15 mmHg. FINDINGS  Left Ventricle: Left ventricular ejection fraction, by visual estimation, is 30 to 35%. The left ventricle has moderate to severely decreased function. The left ventricle demonstrates global hypokinesis. The left ventricular internal cavity size was the  left ventricle is normal in size. There is mildly increased left ventricular hypertrophy. Left ventricular diastolic parameters are indeterminate. Right Ventricle: The right ventricular size is normal. No increase in right ventricular wall thickness. Global RV systolic function is has mildly reduced systolic function. Left Atrium: Left atrial size was upper normal. Right Atrium: Right atrial size was normal in size Pericardium: There is no  evidence of pericardial effusion. Mitral Valve: The mitral valve is grossly normal. Mild mitral annular calcification. Trivial mitral valve regurgitation. Tricuspid Valve: The tricuspid valve is grossly normal. Tricuspid valve regurgitation is trivial. Aortic Valve: The aortic valve is tricuspid. Aortic valve regurgitation is not visualized. Mild aortic valve sclerosis is present, with no evidence of aortic valve stenosis. Mild aortic valve annular calcification. Pulmonic Valve: The pulmonic valve was grossly normal. Pulmonic valve regurgitation is trivial. Pulmonic regurgitation is trivial. Aorta: The aortic root is normal in size and structure. Venous: The inferior vena cava is dilated in size with less than 50% respiratory variability, suggesting right atrial pressure of 15 mmHg. IAS/Shunts: No atrial level shunt detected by color flow Doppler.  LEFT VENTRICLE PLAX 2D LVIDd:         4.61 cm  Diastology LVIDs:         3.70 cm  LV e' lateral:   8.27 cm/s LV PW:         1.05 cm  LV E/e' lateral: 12.5 LV IVS:        1.36 cm  LV e' medial:    6.53 cm/s LVOT diam:     1.90 cm  LV E/e' medial:  15.8 LV SV:         40 ml LV SV Index:   18.65 LVOT Area:     2.84 cm  RIGHT VENTRICLE RV S prime:     7.62 cm/s TAPSE (M-mode): 1.4 cm LEFT ATRIUM             Index       RIGHT ATRIUM           Index LA diam:        3.70 cm 1.80 cm/m  RA Area:     14.40 cm LA Vol (A2C):   52.0 ml 25.28 ml/m RA Volume:   38.40 ml  18.67 ml/m LA Vol (A4C):   67.6 ml 32.87 ml/m LA Biplane Vol: 60.5 ml 29.42 ml/m   AORTA Ao Root diam: 2.80 cm MITRAL VALVE MV Area (PHT): 3.77 cm              SHUNTS MV PHT:        58.29 msec            Systemic Diam: 1.90 cm MV Decel Time: 201 msec MV E velocity: 103.00 cm/s 103 cm/s MV A velocity: 32.50 cm/s  70.3 cm/s MV E/A ratio:  3.17        1.5  Rozann Lesches MD Electronically signed by Rozann Lesches MD Signature Date/Time: 03/13/2019/4:28:01 PM    Final     Cardiac Studies   Echocardiogram:  03/13/2019 IMPRESSIONS   1. Left ventricular ejection fraction, by visual estimation, is 30 to 35%. The left ventricle has moderate to severely decreased function. There is mildly increased left ventricular hypertrophy. 2. Left ventricular diastolic parameters are indeterminate in the setting of atrial fibrillation. 3. The left ventricle demonstrates global hypokinesis with paradoxical septal motion. 4. Global right ventricle has mildly reduced systolic function.The right ventricular size is normal. No increase in right ventricular wall thickness. 5. Left atrial size was upper normal. 6. Right atrial size was normal. 7. Mild mitral annular calcification. 8. The mitral valve is grossly normal. Trivial mitral valve regurgitation. 9. The tricuspid valve is grossly normal. Tricuspid valve regurgitation is trivial. 10. The aortic valve is tricuspid. Aortic valve regurgitation is not visualized. Mild aortic valve sclerosis without stenosis. 11. The pulmonic valve was grossly normal. Pulmonic valve regurgitation is trivial. 12. TR signal is inadequate for assessing pulmonary artery systolic pressure. 13. The inferior vena cava is dilated in size with <50% respiratory variability, suggesting right atrial pressure of 15 mmHg.  Cardiac Cath 03/15/19  Dist LM to Ost LAD lesion is 80% stenosed.  Ost LM to Dist LM lesion is 50% stenosed.  Mid Cx lesion is 30% stenosed.  Prox RCA-1 lesion is 60% stenosed.  Prox RCA-2 lesion is 99% stenosed.   1. Severe calcific stenosis in the distal left main, ostial LAD and mid RCA 2. Elevated filling pressures  Recommendations: He has a severe, heavily calcified stenosis in the mid RCA. He also has a heavily calcified distal left main, ostial LAD stenosis. Revascularization with PCI would be high risk and require atherectomy in the ostial LAD extending back into the left main with chance of obstruction of flow into the Circumflex. He is 86 years old  and would be high risk for CABG as well but he is overall a very functional patient for his age. I will ask CT surgery to see him to discuss CABG. He will need optimization of his heart rate control and diuresis. I will start Lasix 40 mg IV BID.   Coronary Diagrams  Diagnostic Dominance: Right    Patient Profile     86 y.o. male with pmh of CAD (s/p PCI in 1992, significant CAD by CT in 01/2018-> cath not pursued given no symptoms), chronic diastolic CHF, paroxysmal Afib, HTN, HLD, follicular lymphoma and h/p prostate cancer who is being seen for the valuation of afib with RVR  Assessment & Plan    Chest pain/CAD  - Patient reported progressive exertional chest pain - HS troponin 32 > 440 >  926 > 585. Also though it could be demand ischemia in the setting of afib RVR. - Patient did undergo DCCV in the ED but did not convert. Xarelto was held in anticipation of cath and covered with heparin - LHC showed severe calcified stenosis in the mid RCA, distal left main, and ostial LAD stenosis. Revascularization with PCI would be high risk. Given that patient is an active 86 yr old CT surgery was consulted for possible CABG  Afib RVR - known history of Paroxysmal Afib - In the ED attempted DCCV x 2 with no success.  - TSH 3.68, K 3.7, Mg 2.1 - Patient remains in afib with rates up to 130 - Receiveing Lopressor 50 mg Q8 hours - Avoid Cardizem given reduced EF. Might need to consider Amiodarone - Xarelto held for cath>>covered with heparin  New Cardiomyopathy - EF reduced to 30-35% - continue BB - Would start ARB prior to discharge it pressures allow - IV Lasix 40 mg BID started - Monitor daily weights and Strict I/Os - Follow creatinine with diuresis  HTN - continue Lopressor - Pressures stable  HLD - At home was on Atorvastatin 10 mg daily - LDL 130 . Goal < 70 - Increase atorvastatin  For questions or updates, please contact [place] Please consult [email or web address] for  contact info under        Signed, Koi Yarbro Ninfa Meeker, PA-C  03/15/2019, 10:00 AM

## 2019-03-15 NOTE — Plan of Care (Signed)
  Problem: Education: Goal: Knowledge of General Education information will improve Description: Including pain rating scale, medication(s)/side effects and non-pharmacologic comfort measures Outcome: Progressing   Problem: Health Behavior/Discharge Planning: Goal: Ability to manage health-related needs will improve Outcome: Progressing   Problem: Clinical Measurements: Goal: Ability to maintain clinical measurements within normal limits will improve Outcome: Progressing Goal: Will remain free from infection Outcome: Progressing Goal: Diagnostic test results will improve Outcome: Progressing Goal: Respiratory complications will improve Outcome: Progressing Goal: Cardiovascular complication will be avoided Outcome: Progressing   Problem: Activity: Goal: Risk for activity intolerance will decrease Outcome: Progressing   Problem: Nutrition: Goal: Adequate nutrition will be maintained Outcome: Progressing   Problem: Coping: Goal: Level of anxiety will decrease Outcome: Progressing   Problem: Elimination: Goal: Will not experience complications related to bowel motility Outcome: Progressing Goal: Will not experience complications related to urinary retention Outcome: Progressing   Problem: Pain Managment: Goal: General experience of comfort will improve Outcome: Progressing   Problem: Safety: Goal: Ability to remain free from injury will improve Outcome: Progressing   Problem: Skin Integrity: Goal: Risk for impaired skin integrity will decrease Outcome: Progressing   Problem: Education: Goal: Knowledge of disease or condition will improve Outcome: Progressing Goal: Understanding of medication regimen will improve Outcome: Progressing Goal: Individualized Educational Video(s) Outcome: Progressing   Problem: Activity: Goal: Ability to tolerate increased activity will improve Outcome: Progressing   Problem: Cardiac: Goal: Ability to achieve and maintain  adequate cardiopulmonary perfusion will improve Outcome: Progressing   Problem: Health Behavior/Discharge Planning: Goal: Ability to safely manage health-related needs after discharge will improve Outcome: Progressing   Problem: Education: Goal: Understanding of CV disease, CV risk reduction, and recovery process will improve Outcome: Progressing Goal: Individualized Educational Video(s) Outcome: Progressing   Problem: Activity: Goal: Ability to return to baseline activity level will improve Outcome: Progressing   Problem: Cardiovascular: Goal: Ability to achieve and maintain adequate cardiovascular perfusion will improve Outcome: Progressing Goal: Vascular access site(s) Level 0-1 will be maintained Outcome: Progressing   Problem: Health Behavior/Discharge Planning: Goal: Ability to safely manage health-related needs after discharge will improve Outcome: Progressing

## 2019-03-15 NOTE — Progress Notes (Addendum)
Patient off of unit.  ABG not drawn.  Will draw it when patient returns or report to night shift if pt does not return.

## 2019-03-16 ENCOUNTER — Inpatient Hospital Stay (HOSPITAL_COMMUNITY): Payer: Medicare Other | Admitting: Certified Registered"

## 2019-03-16 ENCOUNTER — Inpatient Hospital Stay (HOSPITAL_COMMUNITY): Payer: Medicare Other

## 2019-03-16 ENCOUNTER — Inpatient Hospital Stay (HOSPITAL_COMMUNITY)
Admission: EM | Disposition: A | Discharge status: Rehabilitation Facility | Payer: Self-pay | Source: Home / Self Care | Attending: Cardiothoracic Surgery

## 2019-03-16 ENCOUNTER — Encounter (HOSPITAL_COMMUNITY): Payer: Self-pay | Admitting: Family Medicine

## 2019-03-16 DIAGNOSIS — Z951 Presence of aortocoronary bypass graft: Secondary | ICD-10-CM

## 2019-03-16 DIAGNOSIS — I4819 Other persistent atrial fibrillation: Secondary | ICD-10-CM

## 2019-03-16 DIAGNOSIS — I25110 Atherosclerotic heart disease of native coronary artery with unstable angina pectoris: Secondary | ICD-10-CM

## 2019-03-16 HISTORY — PX: MAZE: SHX5063

## 2019-03-16 HISTORY — PX: TEE WITHOUT CARDIOVERSION: SHX5443

## 2019-03-16 HISTORY — PX: CORONARY ARTERY BYPASS GRAFT: SHX141

## 2019-03-16 HISTORY — PX: CLIPPING OF ATRIAL APPENDAGE: SHX5773

## 2019-03-16 LAB — CBC
HCT: 35.4 % — ABNORMAL LOW (ref 39.0–52.0)
HCT: 25.6 % — ABNORMAL LOW (ref 39.0–52.0)
HCT: 25.6 % — ABNORMAL LOW (ref 39.0–52.0)
Hemoglobin: 11.3 g/dL — ABNORMAL LOW (ref 13.0–17.0)
Hemoglobin: 8 g/dL — ABNORMAL LOW (ref 13.0–17.0)
Hemoglobin: 8 g/dL — ABNORMAL LOW (ref 13.0–17.0)
MCH: 28.9 pg (ref 26.0–34.0)
MCH: 29.5 pg (ref 26.0–34.0)
MCH: 29.4 pg (ref 26.0–34.0)
MCHC: 31.9 g/dL (ref 30.0–36.0)
MCHC: 31.3 g/dL (ref 30.0–36.0)
MCHC: 31.3 g/dL (ref 30.0–36.0)
MCV: 90.5 fL (ref 80.0–100.0)
MCV: 94.5 fL (ref 80.0–100.0)
MCV: 94.1 fL (ref 80.0–100.0)
Platelets: 202 10*3/uL (ref 150–400)
Platelets: DECREASED 10*3/uL (ref 150–400)
Platelets: 127 10*3/uL — ABNORMAL LOW (ref 150–400)
RBC: 3.91 MIL/uL — ABNORMAL LOW (ref 4.22–5.81)
RBC: 2.71 MIL/uL — ABNORMAL LOW (ref 4.22–5.81)
RBC: 2.72 MIL/uL — ABNORMAL LOW (ref 4.22–5.81)
RDW: 14.6 % (ref 11.5–15.5)
RDW: 14.4 % (ref 11.5–15.5)
RDW: 14.3 % (ref 11.5–15.5)
WBC: 7.7 10*3/uL (ref 4.0–10.5)
WBC: 21.7 10*3/uL — ABNORMAL HIGH (ref 4.0–10.5)
WBC: 16.9 10*3/uL — ABNORMAL HIGH (ref 4.0–10.5)
nRBC: 0 % (ref 0.0–0.2)
nRBC: 0 % (ref 0.0–0.2)
nRBC: 0 % (ref 0.0–0.2)

## 2019-03-16 LAB — GLUCOSE, CAPILLARY
Glucose-Capillary: 124 mg/dL — ABNORMAL HIGH (ref 70–99)
Glucose-Capillary: 118 mg/dL — ABNORMAL HIGH (ref 70–99)
Glucose-Capillary: 116 mg/dL — ABNORMAL HIGH (ref 70–99)
Glucose-Capillary: 132 mg/dL — ABNORMAL HIGH (ref 70–99)
Glucose-Capillary: 138 mg/dL — ABNORMAL HIGH (ref 70–99)

## 2019-03-16 LAB — APTT
aPTT: 97 seconds — ABNORMAL HIGH (ref 24–36)
aPTT: 38 seconds — ABNORMAL HIGH (ref 24–36)

## 2019-03-16 LAB — BASIC METABOLIC PANEL
Anion gap: 12 (ref 5–15)
Anion gap: 8 (ref 5–15)
BUN: 15 mg/dL (ref 8–23)
BUN: 12 mg/dL (ref 8–23)
CO2: 28 mmol/L (ref 22–32)
CO2: 23 mmol/L (ref 22–32)
Calcium: 8.6 mg/dL — ABNORMAL LOW (ref 8.9–10.3)
Calcium: 8.1 mg/dL — ABNORMAL LOW (ref 8.9–10.3)
Chloride: 95 mmol/L — ABNORMAL LOW (ref 98–111)
Chloride: 106 mmol/L (ref 98–111)
Creatinine, Ser: 1.08 mg/dL (ref 0.61–1.24)
Creatinine, Ser: 1.01 mg/dL (ref 0.61–1.24)
GFR calc Af Amer: >60 mL/min (ref >60)
GFR calc Af Amer: >60 mL/min (ref >60)
GFR calc non Af Amer: >60 mL/min (ref >60)
GFR calc non Af Amer: >60 mL/min (ref >60)
Glucose, Bld: 129 mg/dL — ABNORMAL HIGH (ref 70–99)
Glucose, Bld: 183 mg/dL — ABNORMAL HIGH (ref 70–99)
Potassium: 3.5 mmol/L (ref 3.5–5.1)
Potassium: 4.8 mmol/L (ref 3.5–5.1)
Sodium: 135 mmol/L (ref 135–145)
Sodium: 137 mmol/L (ref 135–145)

## 2019-03-16 LAB — POCT I-STAT, CHEM 8
BUN: 14 mg/dL (ref 8–23)
BUN: 14 mg/dL (ref 8–23)
BUN: 13 mg/dL (ref 8–23)
BUN: 13 mg/dL (ref 8–23)
BUN: 13 mg/dL (ref 8–23)
BUN: 13 mg/dL (ref 8–23)
BUN: 12 mg/dL (ref 8–23)
BUN: 13 mg/dL (ref 8–23)
BUN: 12 mg/dL (ref 8–23)
Calcium, Ion: 1.14 mmol/L — ABNORMAL LOW (ref 1.15–1.40)
Calcium, Ion: 1.13 mmol/L — ABNORMAL LOW (ref 1.15–1.40)
Calcium, Ion: 0.89 mmol/L — CL (ref 1.15–1.40)
Calcium, Ion: 0.95 mmol/L — ABNORMAL LOW (ref 1.15–1.40)
Calcium, Ion: 1.02 mmol/L — ABNORMAL LOW (ref 1.15–1.40)
Calcium, Ion: 1.02 mmol/L — ABNORMAL LOW (ref 1.15–1.40)
Calcium, Ion: 1.02 mmol/L — ABNORMAL LOW (ref 1.15–1.40)
Calcium, Ion: 1.05 mmol/L — ABNORMAL LOW (ref 1.15–1.40)
Calcium, Ion: 0.95 mmol/L — ABNORMAL LOW (ref 1.15–1.40)
Chloride: 93 mmol/L — ABNORMAL LOW (ref 98–111)
Chloride: 95 mmol/L — ABNORMAL LOW (ref 98–111)
Chloride: 91 mmol/L — ABNORMAL LOW (ref 98–111)
Chloride: 93 mmol/L — ABNORMAL LOW (ref 98–111)
Chloride: 95 mmol/L — ABNORMAL LOW (ref 98–111)
Chloride: 95 mmol/L — ABNORMAL LOW (ref 98–111)
Chloride: 97 mmol/L — ABNORMAL LOW (ref 98–111)
Chloride: 97 mmol/L — ABNORMAL LOW (ref 98–111)
Chloride: 98 mmol/L (ref 98–111)
Creatinine, Ser: 0.9 mg/dL (ref 0.61–1.24)
Creatinine, Ser: 0.9 mg/dL (ref 0.61–1.24)
Creatinine, Ser: 0.7 mg/dL (ref 0.61–1.24)
Creatinine, Ser: 0.7 mg/dL (ref 0.61–1.24)
Creatinine, Ser: 0.7 mg/dL (ref 0.61–1.24)
Creatinine, Ser: 0.8 mg/dL (ref 0.61–1.24)
Creatinine, Ser: 0.7 mg/dL (ref 0.61–1.24)
Creatinine, Ser: 0.7 mg/dL (ref 0.61–1.24)
Creatinine, Ser: 0.7 mg/dL (ref 0.61–1.24)
Glucose, Bld: 132 mg/dL — ABNORMAL HIGH (ref 70–99)
Glucose, Bld: 165 mg/dL — ABNORMAL HIGH (ref 70–99)
Glucose, Bld: 144 mg/dL — ABNORMAL HIGH (ref 70–99)
Glucose, Bld: 167 mg/dL — ABNORMAL HIGH (ref 70–99)
Glucose, Bld: 187 mg/dL — ABNORMAL HIGH (ref 70–99)
Glucose, Bld: 194 mg/dL — ABNORMAL HIGH (ref 70–99)
Glucose, Bld: 180 mg/dL — ABNORMAL HIGH (ref 70–99)
Glucose, Bld: 181 mg/dL — ABNORMAL HIGH (ref 70–99)
Glucose, Bld: 159 mg/dL — ABNORMAL HIGH (ref 70–99)
HCT: 30 % — ABNORMAL LOW (ref 39.0–52.0)
HCT: 28 % — ABNORMAL LOW (ref 39.0–52.0)
HCT: 23 % — ABNORMAL LOW (ref 39.0–52.0)
HCT: 23 % — ABNORMAL LOW (ref 39.0–52.0)
HCT: 22 % — ABNORMAL LOW (ref 39.0–52.0)
HCT: 25 % — ABNORMAL LOW (ref 39.0–52.0)
HCT: 23 % — ABNORMAL LOW (ref 39.0–52.0)
HCT: 24 % — ABNORMAL LOW (ref 39.0–52.0)
HCT: 31 % — ABNORMAL LOW (ref 39.0–52.0)
Hemoglobin: 10.2 g/dL — ABNORMAL LOW (ref 13.0–17.0)
Hemoglobin: 9.5 g/dL — ABNORMAL LOW (ref 13.0–17.0)
Hemoglobin: 7.8 g/dL — ABNORMAL LOW (ref 13.0–17.0)
Hemoglobin: 7.8 g/dL — ABNORMAL LOW (ref 13.0–17.0)
Hemoglobin: 7.5 g/dL — ABNORMAL LOW (ref 13.0–17.0)
Hemoglobin: 8.5 g/dL — ABNORMAL LOW (ref 13.0–17.0)
Hemoglobin: 7.8 g/dL — ABNORMAL LOW (ref 13.0–17.0)
Hemoglobin: 8.2 g/dL — ABNORMAL LOW (ref 13.0–17.0)
Hemoglobin: 10.5 g/dL — ABNORMAL LOW (ref 13.0–17.0)
Potassium: 3.8 mmol/L (ref 3.5–5.1)
Potassium: 4 mmol/L (ref 3.5–5.1)
Potassium: 4 mmol/L (ref 3.5–5.1)
Potassium: 4.1 mmol/L (ref 3.5–5.1)
Potassium: 4.4 mmol/L (ref 3.5–5.1)
Potassium: 4.8 mmol/L (ref 3.5–5.1)
Potassium: 4.5 mmol/L (ref 3.5–5.1)
Potassium: 4.7 mmol/L (ref 3.5–5.1)
Potassium: 3.6 mmol/L (ref 3.5–5.1)
Sodium: 134 mmol/L — ABNORMAL LOW (ref 135–145)
Sodium: 135 mmol/L (ref 135–145)
Sodium: 131 mmol/L — ABNORMAL LOW (ref 135–145)
Sodium: 136 mmol/L (ref 135–145)
Sodium: 134 mmol/L — ABNORMAL LOW (ref 135–145)
Sodium: 134 mmol/L — ABNORMAL LOW (ref 135–145)
Sodium: 134 mmol/L — ABNORMAL LOW (ref 135–145)
Sodium: 136 mmol/L (ref 135–145)
Sodium: 138 mmol/L (ref 135–145)
TCO2: 31 mmol/L (ref 22–32)
TCO2: 31 mmol/L (ref 22–32)
TCO2: 31 mmol/L (ref 22–32)
TCO2: 32 mmol/L (ref 22–32)
TCO2: 30 mmol/L (ref 22–32)
TCO2: 30 mmol/L (ref 22–32)
TCO2: 29 mmol/L (ref 22–32)
TCO2: 29 mmol/L (ref 22–32)
TCO2: 28 mmol/L (ref 22–32)

## 2019-03-16 LAB — ECHO INTRAOPERATIVE TEE
Height: 68 in
Weight: 3092.8 oz

## 2019-03-16 LAB — PROTIME-INR
INR: 1.6 — ABNORMAL HIGH (ref 0.8–1.2)
Prothrombin Time: 18.6 seconds — ABNORMAL HIGH (ref 11.4–15.2)

## 2019-03-16 LAB — POCT I-STAT 7, (LYTES, BLD GAS, ICA,H+H)
Acid-Base Excess: 8 mmol/L — ABNORMAL HIGH (ref 0.0–2.0)
Bicarbonate: 32.3 mmol/L — ABNORMAL HIGH (ref 20.0–28.0)
Bicarbonate: 25.9 mmol/L (ref 20.0–28.0)
Calcium, Ion: 0.9 mmol/L — ABNORMAL LOW (ref 1.15–1.40)
Calcium, Ion: 0.96 mmol/L — ABNORMAL LOW (ref 1.15–1.40)
HCT: 25 % — ABNORMAL LOW (ref 39.0–52.0)
HCT: 26 % — ABNORMAL LOW (ref 39.0–52.0)
Hemoglobin: 8.5 g/dL — ABNORMAL LOW (ref 13.0–17.0)
Hemoglobin: 8.8 g/dL — ABNORMAL LOW (ref 13.0–17.0)
O2 Saturation: 100 %
O2 Saturation: 100 %
Potassium: 3.9 mmol/L (ref 3.5–5.1)
Potassium: 3.7 mmol/L (ref 3.5–5.1)
Sodium: 137 mmol/L (ref 135–145)
Sodium: 139 mmol/L (ref 135–145)
TCO2: 34 mmol/L — ABNORMAL HIGH (ref 22–32)
TCO2: 27 mmol/L (ref 22–32)
pCO2 arterial: 46.2 mmHg (ref 32.0–48.0)
pCO2 arterial: 45.7 mmHg (ref 32.0–48.0)
pH, Arterial: 7.453 — ABNORMAL HIGH (ref 7.350–7.450)
pH, Arterial: 7.362 (ref 7.350–7.450)
pO2, Arterial: 394 mmHg — ABNORMAL HIGH (ref 83.0–108.0)
pO2, Arterial: 365 mmHg — ABNORMAL HIGH (ref 83.0–108.0)

## 2019-03-16 LAB — LIPID PANEL
Cholesterol: 117 mg/dL (ref 0–200)
HDL: 33 mg/dL — ABNORMAL LOW (ref >40)
LDL Cholesterol: 74 mg/dL (ref 0–99)
Total CHOL/HDL Ratio: 3.5 RATIO
Triglycerides: 52 mg/dL (ref <150)
VLDL: 10 mg/dL (ref 0–40)

## 2019-03-16 LAB — PLATELET COUNT: Platelets: 171 10*3/uL (ref 150–400)

## 2019-03-16 LAB — HEMOGLOBIN AND HEMATOCRIT, BLOOD
HCT: 23.8 % — ABNORMAL LOW (ref 39.0–52.0)
Hemoglobin: 7.7 g/dL — ABNORMAL LOW (ref 13.0–17.0)

## 2019-03-16 LAB — HEPARIN LEVEL (UNFRACTIONATED): Heparin Unfractionated: 0.41 IU/mL (ref 0.30–0.70)

## 2019-03-16 LAB — MAGNESIUM: Magnesium: 2.6 mg/dL — ABNORMAL HIGH (ref 1.7–2.4)

## 2019-03-16 LAB — PREPARE RBC (CROSSMATCH)

## 2019-03-16 SURGERY — CORONARY ARTERY BYPASS GRAFTING (CABG)
Anesthesia: General | Site: Chest

## 2019-03-16 MED ORDER — DEXMEDETOMIDINE HCL IN NACL 400 MCG/100ML IV SOLN
0.0000 ug/kg/h | INTRAVENOUS | Status: DC
  Administered 2019-03-16: 0.3 ug/kg/h via INTRAVENOUS
  Administered 2019-03-16: 0.7 ug/kg/h via INTRAVENOUS
  Filled 2019-03-16: qty 100

## 2019-03-16 MED ORDER — SODIUM CHLORIDE 0.9 % IV SOLN
INTRAVENOUS | Status: DC
  Administered 2019-03-16: 10 mL/h via INTRAVENOUS

## 2019-03-16 MED ORDER — SODIUM CHLORIDE 0.45 % IV SOLN: INTRAVENOUS | Status: DC | PRN

## 2019-03-16 MED ORDER — NOREPINEPHRINE 4 MG/250ML-% IV SOLN
5.0000 ug/min | INTRAVENOUS | Status: DC
  Administered 2019-03-16: 6 ug/min via INTRAVENOUS
  Administered 2019-03-18: 8 ug/min via INTRAVENOUS
  Administered 2019-03-19 – 2019-03-20 (×2): 5 ug/min via INTRAVENOUS
  Administered 2019-03-20: 20 ug/min via INTRAVENOUS
  Filled 2019-03-16 (×6): qty 250

## 2019-03-16 MED ORDER — MIDAZOLAM HCL 2 MG/2ML IJ SOLN
INTRAMUSCULAR | Status: AC
  Administered 2019-03-16: 1 mg via INTRAVENOUS
  Filled 2019-03-16: qty 2

## 2019-03-16 MED ORDER — ALBUMIN HUMAN 5 % IV SOLN
250.0000 mL | INTRAVENOUS | Status: AC | PRN
  Administered 2019-03-16 (×2): 12.5 g via INTRAVENOUS
  Filled 2019-03-16: qty 500

## 2019-03-16 MED ORDER — LACTATED RINGERS IV SOLN: INTRAVENOUS | Status: DC

## 2019-03-16 MED ORDER — PROTAMINE SULFATE 10 MG/ML IV SOLN
INTRAVENOUS | Status: DC | PRN
  Administered 2019-03-16: 300 mg via INTRAVENOUS

## 2019-03-16 MED ORDER — PLASMA-LYTE 148 IV SOLN
INTRAVENOUS | Status: DC | PRN
  Administered 2019-03-16 (×2): 500 mL via INTRAVASCULAR

## 2019-03-16 MED ORDER — ATORVASTATIN CALCIUM 80 MG PO TABS
80.0000 mg | ORAL_TABLET | Freq: Every day | ORAL | Status: DC
  Administered 2019-03-17 – 2019-03-25 (×8): 80 mg via ORAL
  Filled 2019-03-16 (×8): qty 1

## 2019-03-16 MED ORDER — PROPOFOL 10 MG/ML IV BOLUS
INTRAVENOUS | Status: DC | PRN
  Administered 2019-03-16: 40 mg via INTRAVENOUS

## 2019-03-16 MED ORDER — MORPHINE SULFATE (PF) 2 MG/ML IV SOLN
1.0000 mg | INTRAVENOUS | Status: DC | PRN
  Administered 2019-03-17 (×2): 4 mg via INTRAVENOUS
  Administered 2019-03-17: 2 mg via INTRAVENOUS
  Administered 2019-03-17 – 2019-03-18 (×2): 4 mg via INTRAVENOUS
  Filled 2019-03-16 (×2): qty 2
  Filled 2019-03-16: qty 1
  Filled 2019-03-16 (×2): qty 2

## 2019-03-16 MED ORDER — PROPOFOL 10 MG/ML IV BOLUS
INTRAVENOUS | Status: AC
  Filled 2019-03-16: qty 20

## 2019-03-16 MED ORDER — MIDAZOLAM HCL 2 MG/2ML IJ SOLN: 1.0000 mg | Freq: Once | INTRAMUSCULAR | Status: AC

## 2019-03-16 MED ORDER — ACETAMINOPHEN 160 MG/5ML PO SOLN: 650.0000 mg | Freq: Once | ORAL | Status: AC

## 2019-03-16 MED ORDER — NOREPINEPHRINE 4 MG/250ML-% IV SOLN
INTRAVENOUS | Status: DC | PRN
  Administered 2019-03-16: 3 ug/kg/min via INTRAVENOUS

## 2019-03-16 MED ORDER — ALBUMIN HUMAN 5 % IV SOLN: INTRAVENOUS | Status: DC | PRN

## 2019-03-16 MED ORDER — ASPIRIN 81 MG PO CHEW
324.0000 mg | CHEWABLE_TABLET | Freq: Every day | ORAL | Status: DC
  Administered 2019-03-17 – 2019-03-25 (×7): 324 mg
  Filled 2019-03-16 (×7): qty 4

## 2019-03-16 MED ORDER — HEMOSTATIC AGENTS (NO CHARGE) OPTIME
TOPICAL | Status: DC | PRN
  Administered 2019-03-16 (×3): 1 via TOPICAL

## 2019-03-16 MED ORDER — EPINEPHRINE 1 MG/10ML IJ SOSY
PREFILLED_SYRINGE | INTRAMUSCULAR | Status: AC
  Filled 2019-03-16: qty 20

## 2019-03-16 MED ORDER — FENTANYL CITRATE (PF) 100 MCG/2ML IJ SOLN: 50.0000 ug | Freq: Once | INTRAMUSCULAR | Status: AC

## 2019-03-16 MED ORDER — SODIUM CHLORIDE 0.9 % IV SOLN: 250.0000 mL | INTRAVENOUS | Status: DC

## 2019-03-16 MED ORDER — ALBUMIN HUMAN 5 % IV SOLN
INTRAVENOUS | Status: AC
  Filled 2019-03-16: qty 250

## 2019-03-16 MED ORDER — VANCOMYCIN HCL 1000 MG IV SOLR
INTRAVENOUS | Status: DC | PRN
  Administered 2019-03-16: 1500 mg via INTRAVENOUS

## 2019-03-16 MED ORDER — FENTANYL CITRATE (PF) 250 MCG/5ML IJ SOLN
INTRAMUSCULAR | Status: AC
  Filled 2019-03-16: qty 25

## 2019-03-16 MED ORDER — LACTATED RINGERS IV SOLN: INTRAVENOUS | Status: DC | PRN

## 2019-03-16 MED ORDER — 0.9 % SODIUM CHLORIDE (POUR BTL) OPTIME
TOPICAL | Status: DC | PRN
  Administered 2019-03-16: 5000 mL

## 2019-03-16 MED ORDER — PHENYLEPHRINE HCL-NACL 20-0.9 MG/250ML-% IV SOLN
INTRAVENOUS | Status: DC | PRN
  Administered 2019-03-16: 25 ug/min via INTRAVENOUS

## 2019-03-16 MED ORDER — DEXTROSE 50 % IV SOLN
0.0000 mL | INTRAVENOUS | Status: DC | PRN
  Filled 2019-03-16 (×3): qty 50

## 2019-03-16 MED ORDER — MAGNESIUM SULFATE 4 GM/100ML IV SOLN
INTRAVENOUS | Status: AC
  Filled 2019-03-16: qty 100

## 2019-03-16 MED ORDER — SODIUM CHLORIDE 0.9 % IV SOLN
INTRAVENOUS | Status: DC | PRN
  Administered 2019-03-16: 11:00:00 1.5 g via INTRAVENOUS

## 2019-03-16 MED ORDER — SODIUM CHLORIDE 0.9% FLUSH
10.0000 mL | Freq: Two times a day (BID) | INTRAVENOUS | Status: DC
  Administered 2019-03-17: 10 mL

## 2019-03-16 MED ORDER — ACETAMINOPHEN 160 MG/5ML PO SOLN
1000.0000 mg | Freq: Four times a day (QID) | ORAL | Status: AC
  Administered 2019-03-20 – 2019-03-21 (×6): 1000 mg
  Filled 2019-03-16 (×7): qty 40.6

## 2019-03-16 MED ORDER — FENTANYL CITRATE (PF) 100 MCG/2ML IJ SOLN
INTRAMUSCULAR | Status: AC
  Administered 2019-03-16: 50 ug via INTRAVENOUS
  Filled 2019-03-16: qty 2

## 2019-03-16 MED ORDER — VASOPRESSIN 20 UNIT/ML IV SOLN
INTRAVENOUS | Status: AC
  Filled 2019-03-16: qty 1

## 2019-03-16 MED ORDER — POTASSIUM CHLORIDE 10 MEQ/50ML IV SOLN
10.0000 meq | INTRAVENOUS | Status: AC
  Administered 2019-03-16 (×3): 10 meq via INTRAVENOUS

## 2019-03-16 MED ORDER — PHENYLEPHRINE HCL (PRESSORS) 10 MG/ML IV SOLN
INTRAVENOUS | Status: DC | PRN
  Administered 2019-03-16: 120 ug via INTRAVENOUS
  Administered 2019-03-16: 40 ug via INTRAVENOUS
  Administered 2019-03-16: 120 ug via INTRAVENOUS
  Administered 2019-03-16: 80 ug via INTRAVENOUS
  Administered 2019-03-16: 120 ug via INTRAVENOUS

## 2019-03-16 MED ORDER — ALBUMIN HUMAN 5 % IV SOLN
INTRAVENOUS | Status: AC
  Administered 2019-03-16: 12.5 g
  Filled 2019-03-16: qty 500

## 2019-03-16 MED ORDER — SODIUM CHLORIDE 0.9 % IV SOLN
1.5000 g | Freq: Two times a day (BID) | INTRAVENOUS | Status: AC
  Administered 2019-03-16 – 2019-03-18 (×4): 1.5 g via INTRAVENOUS
  Filled 2019-03-16 (×4): qty 1.5

## 2019-03-16 MED ORDER — METOPROLOL TARTRATE 5 MG/5ML IV SOLN: 2.5000 mg | INTRAVENOUS | Status: DC | PRN

## 2019-03-16 MED ORDER — VANCOMYCIN HCL IN DEXTROSE 1-5 GM/200ML-% IV SOLN
1000.0000 mg | Freq: Once | INTRAVENOUS | Status: AC
  Administered 2019-03-16: 1000 mg via INTRAVENOUS
  Filled 2019-03-16: qty 200

## 2019-03-16 MED ORDER — BISACODYL 5 MG PO TBEC
10.0000 mg | DELAYED_RELEASE_TABLET | Freq: Every day | ORAL | Status: DC
  Administered 2019-03-17 – 2019-04-05 (×4): 10 mg via ORAL
  Filled 2019-03-16 (×4): qty 2

## 2019-03-16 MED ORDER — ARTIFICIAL TEARS OPHTHALMIC OINT
TOPICAL_OINTMENT | OPHTHALMIC | Status: DC | PRN
  Administered 2019-03-16: 1 via OPHTHALMIC

## 2019-03-16 MED ORDER — TRAMADOL HCL 50 MG PO TABS: 50.0000 mg | ORAL_TABLET | ORAL | Status: DC | PRN

## 2019-03-16 MED ORDER — POTASSIUM CHLORIDE 10 MEQ/50ML IV SOLN
INTRAVENOUS | Status: AC
  Filled 2019-03-16: qty 150

## 2019-03-16 MED ORDER — MILRINONE LACTATE IN DEXTROSE 20-5 MG/100ML-% IV SOLN
0.1250 ug/kg/min | INTRAVENOUS | Status: DC
  Administered 2019-03-16 – 2019-03-19 (×3): 0.25 ug/kg/min via INTRAVENOUS
  Administered 2019-03-19 – 2019-03-20 (×2): 0.125 ug/kg/min via INTRAVENOUS
  Filled 2019-03-16 (×7): qty 100

## 2019-03-16 MED ORDER — METOPROLOL TARTRATE 25 MG/10 ML ORAL SUSPENSION: 12.5000 mg | Freq: Two times a day (BID) | ORAL | Status: DC

## 2019-03-16 MED ORDER — FENTANYL CITRATE (PF) 250 MCG/5ML IJ SOLN
INTRAMUSCULAR | Status: DC | PRN
  Administered 2019-03-16: 250 ug via INTRAVENOUS
  Administered 2019-03-16: 150 ug via INTRAVENOUS
  Administered 2019-03-16 (×2): 100 ug via INTRAVENOUS

## 2019-03-16 MED ORDER — OXYCODONE HCL 5 MG PO TABS
5.0000 mg | ORAL_TABLET | ORAL | Status: DC | PRN
  Administered 2019-03-21 – 2019-03-24 (×3): 10 mg via ORAL
  Administered 2019-03-25: 5 mg via ORAL
  Filled 2019-03-16: qty 2
  Filled 2019-03-16: qty 1
  Filled 2019-03-16 (×3): qty 2

## 2019-03-16 MED ORDER — ACETAMINOPHEN 650 MG RE SUPP
RECTAL | Status: AC
  Filled 2019-03-16: qty 1

## 2019-03-16 MED ORDER — SODIUM CHLORIDE 0.9% FLUSH
3.0000 mL | INTRAVENOUS | Status: DC | PRN
  Administered 2019-04-16: 3 mL via INTRAVENOUS

## 2019-03-16 MED ORDER — SODIUM CHLORIDE (PF) 0.9 % IJ SOLN
OROMUCOSAL | Status: DC | PRN
  Administered 2019-03-16 (×3): 4 mL via TOPICAL

## 2019-03-16 MED ORDER — MIDAZOLAM HCL 5 MG/5ML IJ SOLN
INTRAMUSCULAR | Status: DC | PRN
  Administered 2019-03-16 (×2): 4 mg via INTRAVENOUS

## 2019-03-16 MED ORDER — MIDAZOLAM HCL 2 MG/2ML IJ SOLN: 2.0000 mg | INTRAMUSCULAR | Status: DC | PRN

## 2019-03-16 MED ORDER — SODIUM CHLORIDE 0.9% FLUSH
3.0000 mL | Freq: Two times a day (BID) | INTRAVENOUS | Status: DC
  Administered 2019-03-17 – 2019-03-21 (×8): 3 mL via INTRAVENOUS

## 2019-03-16 MED ORDER — INSULIN REGULAR(HUMAN) IN NACL 100-0.9 UT/100ML-% IV SOLN
INTRAVENOUS | Status: DC
  Administered 2019-03-16: 4 [IU]/h via INTRAVENOUS
  Administered 2019-03-17: 2.4 [IU]/h via INTRAVENOUS
  Filled 2019-03-16: qty 100

## 2019-03-16 MED ORDER — BISACODYL 10 MG RE SUPP
10.0000 mg | Freq: Every day | RECTAL | Status: DC
  Administered 2019-03-20 – 2019-03-21 (×2): 10 mg via RECTAL
  Filled 2019-03-16 (×3): qty 1

## 2019-03-16 MED ORDER — CHLORHEXIDINE GLUCONATE CLOTH 2 % EX PADS
6.0000 | MEDICATED_PAD | Freq: Every day | CUTANEOUS | Status: DC
  Administered 2019-03-17 – 2019-04-14 (×25): 6 via TOPICAL

## 2019-03-16 MED ORDER — FAMOTIDINE IN NACL 20-0.9 MG/50ML-% IV SOLN
INTRAVENOUS | Status: AC
  Administered 2019-03-16: 20 mg
  Filled 2019-03-16: qty 50

## 2019-03-16 MED ORDER — MIDAZOLAM HCL (PF) 10 MG/2ML IJ SOLN
INTRAMUSCULAR | Status: AC
  Filled 2019-03-16: qty 2

## 2019-03-16 MED ORDER — METOCLOPRAMIDE HCL 5 MG/ML IJ SOLN
10.0000 mg | Freq: Four times a day (QID) | INTRAMUSCULAR | Status: DC
  Administered 2019-03-16 – 2019-03-18 (×7): 10 mg via INTRAVENOUS
  Filled 2019-03-16 (×6): qty 2

## 2019-03-16 MED ORDER — METOPROLOL TARTRATE 12.5 MG HALF TABLET
12.5000 mg | ORAL_TABLET | Freq: Two times a day (BID) | ORAL | Status: DC
  Administered 2019-03-18: 09:00:00 12.5 mg via ORAL
  Filled 2019-03-16: qty 1

## 2019-03-16 MED ORDER — CHLORHEXIDINE GLUCONATE 0.12 % MT SOLN
15.0000 mL | OROMUCOSAL | Status: AC
  Administered 2019-03-16: 15 mL via OROMUCOSAL

## 2019-03-16 MED ORDER — DOCUSATE SODIUM 100 MG PO CAPS
200.0000 mg | ORAL_CAPSULE | Freq: Every day | ORAL | Status: DC
  Administered 2019-03-17 – 2019-03-19 (×3): 200 mg via ORAL
  Filled 2019-03-16 (×3): qty 2

## 2019-03-16 MED ORDER — HEPARIN SODIUM (PORCINE) 1000 UNIT/ML IJ SOLN
INTRAMUSCULAR | Status: DC | PRN
  Administered 2019-03-16: 2000 [IU] via INTRAVENOUS
  Administered 2019-03-16: 28000 [IU] via INTRAVENOUS

## 2019-03-16 MED ORDER — METOCLOPRAMIDE HCL 5 MG/ML IJ SOLN
INTRAMUSCULAR | Status: AC
  Filled 2019-03-16: qty 2

## 2019-03-16 MED ORDER — EPINEPHRINE PF 1 MG/ML IJ SOLN
0.0000 ug/min | INTRAVENOUS | Status: DC
  Filled 2019-03-16: qty 4

## 2019-03-16 MED ORDER — MAGNESIUM SULFATE 4 GM/100ML IV SOLN
4.0000 g | Freq: Once | INTRAVENOUS | Status: AC
  Administered 2019-03-16: 4 g via INTRAVENOUS

## 2019-03-16 MED ORDER — PANTOPRAZOLE SODIUM 40 MG PO TBEC
40.0000 mg | DELAYED_RELEASE_TABLET | Freq: Every day | ORAL | Status: DC
  Administered 2019-03-18 – 2019-03-19 (×2): 40 mg via ORAL
  Filled 2019-03-16 (×2): qty 1

## 2019-03-16 MED ORDER — VASOPRESSIN 20 UNIT/ML IV SOLN
INTRAVENOUS | Status: DC | PRN
  Administered 2019-03-16: 2 m[IU] via INTRAVENOUS
  Administered 2019-03-16 (×3): 1 m[IU] via INTRAVENOUS

## 2019-03-16 MED ORDER — SODIUM CHLORIDE 0.9% FLUSH
10.0000 mL | INTRAVENOUS | Status: DC | PRN
  Administered 2019-04-04: 15 mL

## 2019-03-16 MED ORDER — CALCIUM CHLORIDE 10 % IV SOLN
INTRAVENOUS | Status: DC | PRN
  Administered 2019-03-16: 1 g via INTRAVENOUS

## 2019-03-16 MED ORDER — ASPIRIN EC 325 MG PO TBEC
325.0000 mg | DELAYED_RELEASE_TABLET | Freq: Every day | ORAL | Status: DC
  Administered 2019-03-18 – 2019-03-19 (×2): 325 mg via ORAL
  Filled 2019-03-16 (×2): qty 1

## 2019-03-16 MED ORDER — FAMOTIDINE IN NACL 20-0.9 MG/50ML-% IV SOLN
20.0000 mg | Freq: Two times a day (BID) | INTRAVENOUS | Status: AC
  Administered 2019-03-16 – 2019-03-17 (×2): 20 mg via INTRAVENOUS
  Filled 2019-03-16 (×2): qty 50

## 2019-03-16 MED ORDER — ACETAMINOPHEN 500 MG PO TABS
1000.0000 mg | ORAL_TABLET | Freq: Four times a day (QID) | ORAL | Status: AC
  Administered 2019-03-17 – 2019-03-19 (×8): 1000 mg via ORAL
  Filled 2019-03-16 (×11): qty 2

## 2019-03-16 MED ORDER — ACETAMINOPHEN 650 MG RE SUPP
650.0000 mg | Freq: Once | RECTAL | Status: AC
  Administered 2019-03-16: 650 mg via RECTAL

## 2019-03-16 MED ORDER — ROCURONIUM BROMIDE 10 MG/ML (PF) SYRINGE
PREFILLED_SYRINGE | INTRAVENOUS | Status: DC | PRN
  Administered 2019-03-16 (×3): 50 mg via INTRAVENOUS
  Administered 2019-03-16: 100 mg via INTRAVENOUS

## 2019-03-16 MED ORDER — LACTATED RINGERS IV SOLN: 500.0000 mL | Freq: Once | INTRAVENOUS | Status: DC | PRN

## 2019-03-16 MED ORDER — ONDANSETRON HCL 4 MG/2ML IJ SOLN
4.0000 mg | Freq: Four times a day (QID) | INTRAMUSCULAR | Status: DC | PRN
  Administered 2019-03-30 – 2019-05-22 (×16): 4 mg via INTRAVENOUS
  Filled 2019-03-16 (×20): qty 2

## 2019-03-16 MED ORDER — SODIUM BICARBONATE 8.4 % IV SOLN
INTRAVENOUS | Status: AC
  Filled 2019-03-16: qty 100

## 2019-03-16 MED FILL — Heparin Sod (Porcine)-NaCl IV Soln 1000 Unit/500ML-0.9%: INTRAVENOUS | Qty: 500 | Status: AC

## 2019-03-16 SURGICAL SUPPLY — 116 items
ADAPTER CARDIO PERF ANTE/RETRO (ADAPTER) ×4 IMPLANT
ATRICLIP EXCLUSION VLAA SYSTEM (Miscellaneous) ×4 IMPLANT
BAG DECANTER FOR FLEXI CONT (MISCELLANEOUS) ×4 IMPLANT
BASKET HEART  (ORDER IN 25'S) (MISCELLANEOUS) ×1
BASKET HEART (ORDER IN 25'S) (MISCELLANEOUS) ×1
BASKET HEART (ORDER IN 25S) (MISCELLANEOUS) ×2 IMPLANT
BLADE CLIPPER SURG (BLADE) ×4 IMPLANT
BLADE STERNUM SYSTEM 6 (BLADE) ×4 IMPLANT
BLADE SURG 11 STRL SS (BLADE) ×4 IMPLANT
BLADE SURG 12 STRL SS (BLADE) ×4 IMPLANT
BNDG ELASTIC 4X5.8 VLCR STR LF (GAUZE/BANDAGES/DRESSINGS) ×4 IMPLANT
BNDG ELASTIC 6X10 VLCR STRL LF (GAUZE/BANDAGES/DRESSINGS) ×4 IMPLANT
BNDG ELASTIC 6X5.8 VLCR STR LF (GAUZE/BANDAGES/DRESSINGS) ×4 IMPLANT
BNDG GAUZE ELAST 4 BULKY (GAUZE/BANDAGES/DRESSINGS) ×4 IMPLANT
CANISTER SUCT 3000ML PPV (MISCELLANEOUS) ×4 IMPLANT
CANNULA GUNDRY RCSP 15FR (MISCELLANEOUS) ×4 IMPLANT
CARDIOBLATE CARDIAC ABLATION (MISCELLANEOUS)
CATH CPB KIT VANTRIGT (MISCELLANEOUS) ×4 IMPLANT
CATH ROBINSON RED A/P 18FR (CATHETERS) ×12 IMPLANT
CATH THORACIC 36FR RT ANG (CATHETERS) ×4 IMPLANT
CLAMP ISOLATOR SYNERGY LG (MISCELLANEOUS) ×4 IMPLANT
CLIP VESOCCLUDE SM WIDE 24/CT (CLIP) ×4 IMPLANT
COVER MAYO STAND STRL (DRAPES) ×4 IMPLANT
DEVICE CARDIOBLATE CARDIAC ABL (MISCELLANEOUS) IMPLANT
DRAIN CHANNEL 32F RND 10.7 FF (WOUND CARE) ×4 IMPLANT
DRAPE CARDIOVASCULAR INCISE (DRAPES) ×2
DRAPE SLUSH/WARMER DISC (DRAPES) ×4 IMPLANT
DRAPE SRG 135X102X78XABS (DRAPES) ×2 IMPLANT
DRSG AQUACEL AG ADV 3.5X14 (GAUZE/BANDAGES/DRESSINGS) ×4 IMPLANT
ELECT BLADE 4.0 EZ CLEAN MEGAD (MISCELLANEOUS) ×4
ELECT BLADE 6.5 EXT (BLADE) ×4 IMPLANT
ELECT CAUTERY BLADE 6.4 (BLADE) ×4 IMPLANT
ELECT REM PT RETURN 9FT ADLT (ELECTROSURGICAL) ×8
ELECTRODE BLDE 4.0 EZ CLN MEGD (MISCELLANEOUS) ×2 IMPLANT
ELECTRODE REM PT RTRN 9FT ADLT (ELECTROSURGICAL) ×4 IMPLANT
FELT TEFLON 1X6 (MISCELLANEOUS) ×8 IMPLANT
GAUZE SPONGE 4X4 12PLY STRL (GAUZE/BANDAGES/DRESSINGS) ×8 IMPLANT
GLOVE BIO SURGEON STRL SZ 6 (GLOVE) ×4 IMPLANT
GLOVE BIO SURGEON STRL SZ 6.5 (GLOVE) ×3 IMPLANT
GLOVE BIO SURGEON STRL SZ7.5 (GLOVE) ×12 IMPLANT
GLOVE BIO SURGEONS STRL SZ 6.5 (GLOVE) ×1
GLOVE BIOGEL PI IND STRL 6 (GLOVE) ×4 IMPLANT
GLOVE BIOGEL PI IND STRL 6.5 (GLOVE) ×8 IMPLANT
GLOVE BIOGEL PI IND STRL 8.5 (GLOVE) ×2 IMPLANT
GLOVE BIOGEL PI INDICATOR 6 (GLOVE) ×4
GLOVE BIOGEL PI INDICATOR 6.5 (GLOVE) ×8
GLOVE BIOGEL PI INDICATOR 8.5 (GLOVE) ×2
GLOVE SS BIOGEL STRL SZ 7.5 (GLOVE) ×2 IMPLANT
GLOVE SUPERSENSE BIOGEL SZ 7.5 (GLOVE) ×2
GLOVE SURG SS PI 6.0 STRL IVOR (GLOVE) ×8 IMPLANT
GOWN STRL REUS W/ TWL LRG LVL3 (GOWN DISPOSABLE) ×16 IMPLANT
GOWN STRL REUS W/TWL LRG LVL3 (GOWN DISPOSABLE) ×16
GRAFT GELWEAVE IMPREG 10X30CM (Prosthesis & Implant Heart) ×4 IMPLANT
HANDLE STAPLE ENDO GIA SHORT (STAPLE) ×2
HEMOSTAT POWDER SURGIFOAM 1G (HEMOSTASIS) ×12 IMPLANT
HEMOSTAT SURGICEL 2X14 (HEMOSTASIS) ×4 IMPLANT
INSERT FOGARTY SM (MISCELLANEOUS) ×8 IMPLANT
INSERT FOGARTY XLG (MISCELLANEOUS) IMPLANT
KIT BASIN OR (CUSTOM PROCEDURE TRAY) ×4 IMPLANT
KIT SUCTION CATH 14FR (SUCTIONS) ×4 IMPLANT
KIT TURNOVER KIT B (KITS) ×4 IMPLANT
KIT VASOVIEW HEMOPRO 2 VH 4000 (KITS) ×4 IMPLANT
LEAD PACING MYOCARDI (MISCELLANEOUS) ×4 IMPLANT
LOOP VESSEL SUPERMAXI WHITE (MISCELLANEOUS) ×4 IMPLANT
MARKER GRAFT CORONARY BYPASS (MISCELLANEOUS) ×12 IMPLANT
NS IRRIG 1000ML POUR BTL (IV SOLUTION) ×24 IMPLANT
PACK E OPEN HEART (SUTURE) ×4 IMPLANT
PACK OPEN HEART (CUSTOM PROCEDURE TRAY) ×4 IMPLANT
PAD ARMBOARD 7.5X6 YLW CONV (MISCELLANEOUS) ×8 IMPLANT
PAD ELECT DEFIB RADIOL ZOLL (MISCELLANEOUS) ×4 IMPLANT
PENCIL BUTTON HOLSTER BLD 10FT (ELECTRODE) ×4 IMPLANT
POSITIONER HEAD DONUT 9IN (MISCELLANEOUS) ×4 IMPLANT
PUNCH AORTIC ROTATE 4.5MM 8IN (MISCELLANEOUS) ×4 IMPLANT
SET CARDIOPLEGIA MPS 5001102 (MISCELLANEOUS) ×4 IMPLANT
SOL ANTI FOG 6CC (MISCELLANEOUS) ×2 IMPLANT
SOLUTION ANTI FOG 6CC (MISCELLANEOUS) ×2
SPONGE LAP 18X18 RF (DISPOSABLE) ×4 IMPLANT
STAPLER ENDO GIA 12MM SHORT (STAPLE) ×2 IMPLANT
SUPPORT HEART JANKE-BARRON (MISCELLANEOUS) ×4 IMPLANT
SURGIFLO W/THROMBIN 8M KIT (HEMOSTASIS) ×4 IMPLANT
SUT BONE WAX W31G (SUTURE) ×4 IMPLANT
SUT MNCRL AB 4-0 PS2 18 (SUTURE) ×4 IMPLANT
SUT PROLENE 3 0 SH DA (SUTURE) ×4 IMPLANT
SUT PROLENE 3 0 SH1 36 (SUTURE) ×4 IMPLANT
SUT PROLENE 4 0 RB 1 (SUTURE) ×8
SUT PROLENE 4 0 SH DA (SUTURE) ×16 IMPLANT
SUT PROLENE 4-0 RB1 .5 CRCL 36 (SUTURE) ×8 IMPLANT
SUT PROLENE 5 0 C 1 36 (SUTURE) ×16 IMPLANT
SUT PROLENE 6 0 C 1 30 (SUTURE) ×16 IMPLANT
SUT PROLENE 6 0 CC (SUTURE) ×12 IMPLANT
SUT PROLENE 8 0 BV175 6 (SUTURE) ×12 IMPLANT
SUT PROLENE BLUE 7 0 (SUTURE) ×8 IMPLANT
SUT SILK  1 MH (SUTURE)
SUT SILK 1 MH (SUTURE) IMPLANT
SUT SILK 2 0 SH CR/8 (SUTURE) ×4 IMPLANT
SUT SILK 3 0 SH CR/8 (SUTURE) IMPLANT
SUT STEEL 6MS V (SUTURE) ×4 IMPLANT
SUT STEEL SZ 6 DBL 3X14 BALL (SUTURE) ×8 IMPLANT
SUT VIC AB 1 CTX 36 (SUTURE) ×4
SUT VIC AB 1 CTX36XBRD ANBCTR (SUTURE) ×4 IMPLANT
SUT VIC AB 2-0 CT1 27 (SUTURE) ×2
SUT VIC AB 2-0 CT1 TAPERPNT 27 (SUTURE) ×2 IMPLANT
SUT VIC AB 2-0 CTX 27 (SUTURE) IMPLANT
SUT VIC AB 3-0 X1 27 (SUTURE) ×4 IMPLANT
SYSTEM EXCLUSION ATRICLIP VLAA (Miscellaneous) ×2 IMPLANT
SYSTEM SAHARA CHEST DRAIN ATS (WOUND CARE) ×4 IMPLANT
TAPE CLOTH SURG 4X10 WHT LF (GAUZE/BANDAGES/DRESSINGS) ×4 IMPLANT
TOWEL GREEN STERILE (TOWEL DISPOSABLE) ×4 IMPLANT
TOWEL GREEN STERILE FF (TOWEL DISPOSABLE) ×4 IMPLANT
TRAY FOLEY SLVR 16FR TEMP STAT (SET/KITS/TRAYS/PACK) ×4 IMPLANT
TUBE CONNECTING 20'X1/4 (TUBING) ×1
TUBE CONNECTING 20X1/4 (TUBING) ×3 IMPLANT
TUBING LAP HI FLOW INSUFFLATIO (TUBING) ×4 IMPLANT
UNDERPAD 30X30 (UNDERPADS AND DIAPERS) ×4 IMPLANT
WATER STERILE IRR 1000ML POUR (IV SOLUTION) ×8 IMPLANT
YANKAUER SUCT BULB TIP NO VENT (SUCTIONS) ×4 IMPLANT

## 2019-03-16 NOTE — Anesthesia Procedure Notes (Signed)
Arterial Line Insertion Start/End1/29/2021 8:35 AM, 03/16/2019 8:53 AM Performed by: Josephine Igo, CRNA, CRNA  Preanesthetic checklist: patient identified, IV checked, risks and benefits discussed, surgical consent and pre-op evaluation Lidocaine 1% used for infiltration and patient sedated Right, radial was placed Catheter size: 20 G Hand hygiene performed , maximum sterile barriers used  and Seldinger technique used  Attempts: 1 Procedure performed without using ultrasound guided technique. Following insertion, dressing applied and Biopatch. Post procedure assessment: normal  Patient tolerated the procedure well with no immediate complications.

## 2019-03-16 NOTE — Progress Notes (Signed)
Attempted to get ABG on patient with no success. Another RT will attempt in AM.

## 2019-03-16 NOTE — Anesthesia Preprocedure Evaluation (Signed)
Anesthesia Evaluation  Patient identified by MRN, date of birth, ID band Patient awake    Reviewed: Allergy & Precautions, NPO status , Patient's Chart, lab work & pertinent test results, reviewed documented beta blocker date and time   Airway Mallampati: II  TM Distance: >3 FB Neck ROM: Full    Dental no notable dental hx. (+) Dental Advisory Given   Pulmonary shortness of breath, former smoker,    Pulmonary exam normal breath sounds clear to auscultation       Cardiovascular hypertension, Pt. on home beta blockers and Pt. on medications + CAD, + Past MI and +CHF  Normal cardiovascular exam Rhythm:Regular Rate:Normal  Echo 03/14/19  1. Left ventricular ejection fraction, by visual estimation, is 30 to 35%. The left ventricle has moderate to severely decreased function. There is mildly increased left ventricular hypertrophy.  2. Left ventricular diastolic parameters are indeterminate in the setting of atrial fibrillation.  3. The left ventricle demonstrates global hypokinesis with paradoxical septal motion.  4. Global right ventricle has mildly reduced systolic function.The right ventricular size is normal. No increase in right ventricular wall thickness.  5. Left atrial size was upper normal.  6. Right atrial size was normal.  7. Mild mitral annular calcification.  8. The mitral valve is grossly normal. Trivial mitral valve regurgitation.  9. The tricuspid valve is grossly normal. Tricuspid valve regurgitation is trivial. 10. The aortic valve is tricuspid. Aortic valve regurgitation is not visualized. Mild aortic valve sclerosis without stenosis. 11. The pulmonic valve was grossly normal. Pulmonic valve regurgitation is trivial. 12. TR signal is inadequate for assessing pulmonary artery systolic pressure. 13. The inferior vena cava is dilated in size with <50% respiratory variability, suggesting right atrial pressure of 15 mmHg.    Neuro/Psych negative neurological ROS  negative psych ROS   GI/Hepatic Neg liver ROS, GERD  ,  Endo/Other  diabetes  Renal/GU negative Renal ROS     Musculoskeletal negative musculoskeletal ROS (+)   Abdominal   Peds  Hematology negative hematology ROS (+)   Anesthesia Other Findings   Reproductive/Obstetrics                             Anesthesia Physical Anesthesia Plan  ASA: IV  Anesthesia Plan: General   Post-op Pain Management:    Induction: Intravenous  PONV Risk Score and Plan: 2 and Midazolam and Treatment may vary due to age or medical condition  Airway Management Planned: Oral ETT  Additional Equipment: Arterial line, CVP, PA Cath, TEE and Ultrasound Guidance Line Placement  Intra-op Plan: Delibrate Circulatory arrest per surgeon request  Post-operative Plan: Post-operative intubation/ventilation  Informed Consent: I have reviewed the patients History and Physical, chart, labs and discussed the procedure including the risks, benefits and alternatives for the proposed anesthesia with the patient or authorized representative who has indicated his/her understanding and acceptance.     Dental advisory given  Plan Discussed with: CRNA  Anesthesia Plan Comments:         Anesthesia Quick Evaluation

## 2019-03-16 NOTE — Anesthesia Procedure Notes (Signed)
Central Venous Catheter Insertion Performed by: Nolon Nations, MD, anesthesiologist Patient location: Pre-op. Preanesthetic checklist: patient identified, IV checked, site marked, risks and benefits discussed, surgical consent, monitors and equipment checked, pre-op evaluation, timeout performed and anesthesia consent Hand hygiene performed  and maximum sterile barriers used  PA cath was placed.Swan type:thermodilution PA Cath depth:48 Procedure performed without using ultrasound guided technique. Attempts: 1 Patient tolerated the procedure well with no immediate complications.

## 2019-03-16 NOTE — Anesthesia Procedure Notes (Signed)
Central Venous Catheter Insertion Performed by: Nolon Nations, MD, anesthesiologist Start/End1/29/2021 8:25 AM, 03/16/2019 8:45 AM Patient location: Pre-op. Preanesthetic checklist: patient identified, IV checked, site marked, risks and benefits discussed, surgical consent, monitors and equipment checked, pre-op evaluation, timeout performed and anesthesia consent Lidocaine 1% used for infiltration and patient sedated Hand hygiene performed  and maximum sterile barriers used  Catheter size: 9 Fr MAC introducer Swan type:thermodilution PA Cath depth:48 Procedure performed using ultrasound guided technique. Ultrasound Notes:anatomy identified, needle tip was noted to be adjacent to the nerve/plexus identified, no ultrasound evidence of intravascular and/or intraneural injection and image(s) printed for medical record Attempts: 1 Following insertion, line sutured, dressing applied and Biopatch. Post procedure assessment: blood return through all ports, free fluid flow and no air  Patient tolerated the procedure well with no immediate complications.

## 2019-03-16 NOTE — Progress Notes (Addendum)
Per Dr. Prescott Gum, wait 4 hours before beginning to wean patient from ventilator.  Do NOT wean epinephrine continuous infusion below 2 mcg.

## 2019-03-16 NOTE — Transfer of Care (Signed)
Immediate Anesthesia Transfer of Care Note  Patient: Jesse Murphy  Procedure(s) Performed: CORONARY ARTERY BYPASS GRAFTING (CABG), ON PUMP, TIMES THREE, USING LEFT INTERNAL MAMMARY ARTERY, RIGHT GREATER SAPHENOUS VEIN HARVESTED ENDOSCOPICALLY (N/A Chest) MAZE (N/A ) CLIPPING OF LEFT  ATRIAL APPENDAGE - USING ATRICLIP SIZE 40 (N/A ) TRANSESOPHAGEAL ECHOCARDIOGRAM (TEE) (N/A )  Patient Location: ICU  Anesthesia Type:General  Level of Consciousness: Patient remains intubated per anesthesia plan  Airway & Oxygen Therapy: Patient remains intubated per anesthesia plan and Patient placed on Ventilator (see vital sign flow sheet for setting)  Post-op Assessment: Report given to RN and Post -op Vital signs reviewed and stable  Post vital signs: Reviewed and stable  Last Vitals:  Vitals Value Taken Time  BP    Temp 36.2 C 03/16/19 1722  Pulse 81 03/16/19 1722  Resp 12 03/16/19 1722  SpO2 99 % 03/16/19 1722  Vitals shown include unvalidated device data.  Last Pain:  Vitals:   03/16/19 0736  TempSrc: Oral  PainSc:          Complications: No apparent anesthesia complications

## 2019-03-16 NOTE — Progress Notes (Signed)
EVENING ROUNDS NOTE :     [address]       [place] [address]             [phone]                 Day of Surgery Procedure(s) (LRB): CORONARY ARTERY BYPASS GRAFTING (CABG), ON PUMP, TIMES THREE, USING LEFT INTERNAL MAMMARY ARTERY, RIGHT GREATER SAPHENOUS VEIN HARVESTED ENDOSCOPICALLY (N/A) MAZE (N/A) CLIPPING OF LEFT  ATRIAL APPENDAGE - USING ATRICLIP SIZE 40 (N/A) TRANSESOPHAGEAL ECHOCARDIOGRAM (TEE) (N/A)   Total Length of Stay:  LOS: 2 days  Events:  Down on epi and levo with volume Good hemodynamics Minimal CT output    BP (!) 98/54   Pulse 83   Temp (!) 97 F (36.1 C)   Resp 12   Ht 5\' 8"  (1.727 m)   Wt 87.7 kg Comment: scale a  SpO2 99%   BMI 29.39 kg/m   PAP: (32-51)/(14-20) 41/17 CVP:  [0 mmHg-12 mmHg] 11 mmHg CO:  [3.1 L/min-3.9 L/min] 3.9 L/min CI:  [1.6 L/min/m2-1.9 L/min/m2] 1.9 L/min/m2  Vent Mode: SIMV;PRVC;PSV FiO2 (%):  [50 %] 50 % Set Rate:  [12 bmp] 12 bmp Vt Set:  [540 mL] 540 mL PEEP:  [5 cmH20] 5 cmH20 Pressure Support:  [10 cmH20] 10 cmH20 Plateau Pressure:  [17 cmH20] 17 cmH20  . sodium chloride    . [START ON 03/17/2019] sodium chloride    . sodium chloride 10 mL/hr (03/16/19 1710)  . albumin human 12.5 g (03/16/19 1720)  . amiodarone Stopped (03/16/19 1711)  . cefUROXime (ZINACEF)  IV    . dexmedetomidine (PRECEDEX) IV infusion 0.5 mcg/kg/hr (03/16/19 1800)  . EPINEPHrine 4 mg in dextrose 5% 250 mL infusion (16 mcg/mL) 1 mcg/min (03/16/19 1800)  . famotidine (PEPCID) IV    . insulin 1 mL/hr at 03/16/19 1800  . lactated ringers    . lactated ringers    . lactated ringers 20 mL/hr at 03/16/19 1800  . magnesium sulfate 4 g (03/16/19 1730)  . milrinone 0.25 mcg/kg/min (03/16/19 1800)  . norepinephrine (LEVOPHED) Adult infusion 8 mcg/min (03/16/19 1800)  . potassium chloride 10 mEq (03/16/19 1800)  . potassium chloride    . vancomycin      I/O last 3 completed shifts: In: 2006.7 [P.O.:1600;  I.V.:406.7] Out: 1800 [Urine:1800]   CBC Latest Ref Rng & Units 03/16/2019 03/16/2019 03/16/2019  WBC 4.0 - 10.5 K/uL - - -  Hemoglobin 13.0 - 17.0 g/dL 8.8(L) 10.5(L) 8.2(L)  Hematocrit 39.0 - 52.0 % 26.0(L) 31.0(L) 24.0(L)  Platelets 150 - 400 K/uL - - -    BMP Latest Ref Rng & Units 03/16/2019 03/16/2019 03/16/2019  Glucose 70 - 99 mg/dL - 159(H) 180(H)  BUN 8 - 23 mg/dL - 12 13  Creatinine 0.61 - 1.24 mg/dL - 0.70 0.70  Sodium 135 - 145 mmol/L 139 138 136  Potassium 3.5 - 5.1 mmol/L 3.7 3.6 4.5  Chloride 98 - 111 mmol/L - 98 97(L)  CO2 22 - 32 mmol/L - - -  Calcium 8.9 - 10.3 mg/dL - - -    ABG    Component Value Date/Time   PHART 7.362 03/16/2019 1615   PCO2ART 45.7 03/16/2019 1615   PO2ART 365.0 (H) 03/16/2019 1615   HCO3 25.9 03/16/2019 1615   TCO2 27 03/16/2019 1615   O2SAT 100.0 03/16/2019 1615       Melodie Bouillon, MD 03/16/2019 6:10 PM

## 2019-03-16 NOTE — Progress Notes (Signed)
Unable to obtain arterial sample.

## 2019-03-16 NOTE — Anesthesia Procedure Notes (Signed)
Procedure Name: Intubation Date/Time: 03/16/2019 10:34 AM Performed by: Bryson Corona, CRNA Pre-anesthesia Checklist: Patient identified, Emergency Drugs available, Suction available and Patient being monitored Patient Re-evaluated:Patient Re-evaluated prior to induction Oxygen Delivery Method: Circle System Utilized Preoxygenation: Pre-oxygenation with 100% oxygen Induction Type: IV induction Ventilation: Oral airway inserted - appropriate to patient size Laryngoscope Size: Mac and 3 Grade View: Grade I Tube type: Oral Tube size: 8.0 mm Number of attempts: 1 Airway Equipment and Method: Stylet and Oral airway Placement Confirmation: ETT inserted through vocal cords under direct vision,  positive ETCO2 and breath sounds checked- equal and bilateral Secured at: 22 cm Tube secured with: Tape Dental Injury: Teeth and Oropharynx as per pre-operative assessment

## 2019-03-16 NOTE — Brief Op Note (Signed)
03/13/2019 - 03/16/2019  2:57 PM  PATIENT:  Jesse Murphy  86 y.o. male  PRE-OPERATIVE DIAGNOSIS:  Coronary artery disease; atrial fibrillation  POST-OPERATIVE DIAGNOSIS:  Coronary artery disease; atrial fibrillation  PROCEDURE:  Procedure(s) with comments: CORONARY ARTERY BYPASS GRAFTING (CABG), ON PUMP, TIMES THREE, USING LEFT INTERNAL MAMMARY ARTERY, RIGHT GREATER SAPHENOUS VEIN HARVESTED ENDOSCOPICALLY (N/A) - swan only MAZE (N/A) CLIPPING OF LEFT  ATRIAL APPENDAGE - USING ATRICLIP SIZE 40 (N/A) TRANSESOPHAGEAL ECHOCARDIOGRAM (TEE) (N/A)  LIMA to LAD SVG to OM1 SVG to PDA  SURGEON:  Surgeon(s) and Role:    Ivin Poot, MD - Primary  PHYSICIAN ASSISTANT:  Nicholes Rough, PA-C    ANESTHESIA:   general  EBL:  800 mL   BLOOD ADMINISTERED:none  DRAINS: ROUTINE   LOCAL MEDICATIONS USED:  NONE  SPECIMEN:  No Specimen  DISPOSITION OF SPECIMEN:  N/A  COUNTS:  YES  DICTATION: .Dragon Dictation  PLAN OF CARE: Admit to inpatient   PATIENT DISPOSITION:  ICU - intubated and hemodynamically stable.   Delay start of Pharmacological VTE agent (>24hrs) due to surgical blood loss or risk of bleeding: yes

## 2019-03-16 NOTE — Progress Notes (Signed)
Pre Procedure note for inpatients:   Jesse Murphy has been scheduled for Procedure(s): RIGHT/LEFT HEART CATH AND CORONARY ANGIOGRAPHY (N/A) today. The various methods of treatment have been discussed with the patient. After consideration of the risks, benefits and treatment options the patient has consented to the planned procedure.   The patient has been seen and labs reviewed. There are no changes in the patient's condition to prevent proceeding with the planned procedure today.  Recent labs:  Lab Results  Component Value Date   WBC 7.7 03/16/2019   HGB 11.3 (L) 03/16/2019   HCT 35.4 (L) 03/16/2019   PLT 202 03/16/2019   GLUCOSE 129 (H) 03/16/2019   CHOL 117 03/16/2019   TRIG 52 03/16/2019   HDL 33 (L) 03/16/2019   LDLCALC 74 03/16/2019   ALT 10 03/15/2019   AST 18 03/15/2019   NA 135 03/16/2019   K 3.5 03/16/2019   CL 95 (L) 03/16/2019   CREATININE 1.08 03/16/2019   BUN 15 03/16/2019   CO2 28 03/16/2019   TSH 3.683 03/13/2019   INR 1.2 03/15/2019   HGBA1C 6.1 (H) 03/13/2019    Len Childs, MD 03/16/2019 7:49 AM

## 2019-03-17 ENCOUNTER — Inpatient Hospital Stay (HOSPITAL_COMMUNITY): Payer: Medicare Other

## 2019-03-17 LAB — BASIC METABOLIC PANEL
Anion gap: 8 (ref 5–15)
Anion gap: 10 (ref 5–15)
BUN: 13 mg/dL (ref 8–23)
BUN: 19 mg/dL (ref 8–23)
CO2: 24 mmol/L (ref 22–32)
CO2: 24 mmol/L (ref 22–32)
Calcium: 8.1 mg/dL — ABNORMAL LOW (ref 8.9–10.3)
Calcium: 8.2 mg/dL — ABNORMAL LOW (ref 8.9–10.3)
Chloride: 103 mmol/L (ref 98–111)
Chloride: 103 mmol/L (ref 98–111)
Creatinine, Ser: 1.03 mg/dL (ref 0.61–1.24)
Creatinine, Ser: 1.43 mg/dL — ABNORMAL HIGH (ref 0.61–1.24)
GFR calc Af Amer: >60 mL/min (ref >60)
GFR calc Af Amer: 50 mL/min — ABNORMAL LOW (ref >60)
GFR calc non Af Amer: >60 mL/min (ref >60)
GFR calc non Af Amer: 43 mL/min — ABNORMAL LOW (ref >60)
Glucose, Bld: 163 mg/dL — ABNORMAL HIGH (ref 70–99)
Glucose, Bld: 129 mg/dL — ABNORMAL HIGH (ref 70–99)
Potassium: 4.6 mmol/L (ref 3.5–5.1)
Potassium: 4.6 mmol/L (ref 3.5–5.1)
Sodium: 135 mmol/L (ref 135–145)
Sodium: 137 mmol/L (ref 135–145)

## 2019-03-17 LAB — POCT I-STAT 7, (LYTES, BLD GAS, ICA,H+H)
Acid-Base Excess: 1 mmol/L (ref 0.0–2.0)
Acid-base deficit: 3 mmol/L — ABNORMAL HIGH (ref 0.0–2.0)
Bicarbonate: 27.9 mmol/L (ref 20.0–28.0)
Bicarbonate: 22.8 mmol/L (ref 20.0–28.0)
Calcium, Ion: 1.18 mmol/L (ref 1.15–1.40)
Calcium, Ion: 1.16 mmol/L (ref 1.15–1.40)
HCT: 26 % — ABNORMAL LOW (ref 39.0–52.0)
HCT: 26 % — ABNORMAL LOW (ref 39.0–52.0)
Hemoglobin: 8.8 g/dL — ABNORMAL LOW (ref 13.0–17.0)
Hemoglobin: 8.8 g/dL — ABNORMAL LOW (ref 13.0–17.0)
O2 Saturation: 98 %
O2 Saturation: 95 %
Patient temperature: 36.3
Patient temperature: 38.6
Potassium: 3.8 mmol/L (ref 3.5–5.1)
Potassium: 4.6 mmol/L (ref 3.5–5.1)
Sodium: 139 mmol/L (ref 135–145)
Sodium: 137 mmol/L (ref 135–145)
TCO2: 30 mmol/L (ref 22–32)
TCO2: 24 mmol/L (ref 22–32)
pCO2 arterial: 54.7 mmHg — ABNORMAL HIGH (ref 32.0–48.0)
pCO2 arterial: 44.3 mmHg (ref 32.0–48.0)
pH, Arterial: 7.312 — ABNORMAL LOW (ref 7.350–7.450)
pH, Arterial: 7.327 — ABNORMAL LOW (ref 7.350–7.450)
pO2, Arterial: 107 mmHg (ref 83.0–108.0)
pO2, Arterial: 89 mmHg (ref 83.0–108.0)

## 2019-03-17 LAB — PREPARE FRESH FROZEN PLASMA
Unit division: 0
Unit division: 0

## 2019-03-17 LAB — CBC
HCT: 25.2 % — ABNORMAL LOW (ref 39.0–52.0)
HCT: 26.7 % — ABNORMAL LOW (ref 39.0–52.0)
Hemoglobin: 7.7 g/dL — ABNORMAL LOW (ref 13.0–17.0)
Hemoglobin: 8.6 g/dL — ABNORMAL LOW (ref 13.0–17.0)
MCH: 28.9 pg (ref 26.0–34.0)
MCH: 30 pg (ref 26.0–34.0)
MCHC: 30.6 g/dL (ref 30.0–36.0)
MCHC: 32.2 g/dL (ref 30.0–36.0)
MCV: 94.7 fL (ref 80.0–100.0)
MCV: 93 fL (ref 80.0–100.0)
Platelets: 133 10*3/uL — ABNORMAL LOW (ref 150–400)
Platelets: 123 10*3/uL — ABNORMAL LOW (ref 150–400)
RBC: 2.66 MIL/uL — ABNORMAL LOW (ref 4.22–5.81)
RBC: 2.87 MIL/uL — ABNORMAL LOW (ref 4.22–5.81)
RDW: 14.5 % (ref 11.5–15.5)
RDW: 14.6 % (ref 11.5–15.5)
WBC: 14.6 10*3/uL — ABNORMAL HIGH (ref 4.0–10.5)
WBC: 17.8 10*3/uL — ABNORMAL HIGH (ref 4.0–10.5)
nRBC: 0 % (ref 0.0–0.2)
nRBC: 0 % (ref 0.0–0.2)

## 2019-03-17 LAB — BPAM FFP
Blood Product Expiration Date: 202102032359
Blood Product Expiration Date: 202102032359
ISSUE DATE / TIME: 202101291440
ISSUE DATE / TIME: 202101291440
Unit Type and Rh: 7300
Unit Type and Rh: 7300

## 2019-03-17 LAB — GLUCOSE, CAPILLARY
Glucose-Capillary: 102 mg/dL — ABNORMAL HIGH (ref 70–99)
Glucose-Capillary: 115 mg/dL — ABNORMAL HIGH (ref 70–99)
Glucose-Capillary: 118 mg/dL — ABNORMAL HIGH (ref 70–99)
Glucose-Capillary: 131 mg/dL — ABNORMAL HIGH (ref 70–99)
Glucose-Capillary: 131 mg/dL — ABNORMAL HIGH (ref 70–99)
Glucose-Capillary: 137 mg/dL — ABNORMAL HIGH (ref 70–99)
Glucose-Capillary: 139 mg/dL — ABNORMAL HIGH (ref 70–99)
Glucose-Capillary: 142 mg/dL — ABNORMAL HIGH (ref 70–99)
Glucose-Capillary: 145 mg/dL — ABNORMAL HIGH (ref 70–99)
Glucose-Capillary: 152 mg/dL — ABNORMAL HIGH (ref 70–99)
Glucose-Capillary: 156 mg/dL — ABNORMAL HIGH (ref 70–99)
Glucose-Capillary: 164 mg/dL — ABNORMAL HIGH (ref 70–99)
Glucose-Capillary: 176 mg/dL — ABNORMAL HIGH (ref 70–99)
Glucose-Capillary: 190 mg/dL — ABNORMAL HIGH (ref 70–99)

## 2019-03-17 LAB — POCT I-STAT, CHEM 8
BUN: 13 mg/dL (ref 8–23)
Calcium, Ion: 1.15 mmol/L (ref 1.15–1.40)
Chloride: 102 mmol/L (ref 98–111)
Creatinine, Ser: 0.9 mg/dL (ref 0.61–1.24)
Glucose, Bld: 186 mg/dL — ABNORMAL HIGH (ref 70–99)
HCT: 23 % — ABNORMAL LOW (ref 39.0–52.0)
Hemoglobin: 7.8 g/dL — ABNORMAL LOW (ref 13.0–17.0)
Potassium: 4.8 mmol/L (ref 3.5–5.1)
Sodium: 136 mmol/L (ref 135–145)
TCO2: 23 mmol/L (ref 22–32)

## 2019-03-17 LAB — COOXEMETRY PANEL
Carboxyhemoglobin: 1.6 % — ABNORMAL HIGH (ref 0.5–1.5)
Methemoglobin: 0.9 % (ref 0.0–1.5)
O2 Saturation: 51.2 %
Total hemoglobin: 8.1 g/dL — ABNORMAL LOW (ref 12.0–16.0)

## 2019-03-17 LAB — MAGNESIUM
Magnesium: 2.4 mg/dL (ref 1.7–2.4)
Magnesium: 2.4 mg/dL (ref 1.7–2.4)

## 2019-03-17 MED ORDER — ORAL CARE MOUTH RINSE: 15.0000 mL | Freq: Two times a day (BID) | OROMUCOSAL | Status: DC

## 2019-03-17 MED ORDER — ORAL CARE MOUTH RINSE
15.0000 mL | OROMUCOSAL | Status: DC
  Administered 2019-03-17 (×5): 15 mL via OROMUCOSAL

## 2019-03-17 MED ORDER — CHLORHEXIDINE GLUCONATE 0.12% ORAL RINSE (MEDLINE KIT)
15.0000 mL | Freq: Two times a day (BID) | OROMUCOSAL | Status: DC
  Administered 2019-03-17 (×2): 15 mL via OROMUCOSAL

## 2019-03-17 NOTE — Anesthesia Postprocedure Evaluation (Signed)
Anesthesia Post Note  Patient: Jesse Murphy  Procedure(s) Performed: CORONARY ARTERY BYPASS GRAFTING (CABG), ON PUMP, TIMES THREE, USING LEFT INTERNAL MAMMARY ARTERY, RIGHT GREATER SAPHENOUS VEIN HARVESTED ENDOSCOPICALLY (N/A Chest) MAZE (N/A ) CLIPPING OF LEFT  ATRIAL APPENDAGE - USING ATRICLIP SIZE 40 (N/A ) TRANSESOPHAGEAL ECHOCARDIOGRAM (TEE) (N/A )     Patient location during evaluation: SICU Anesthesia Type: General Level of consciousness: sedated and patient remains intubated per anesthesia plan Pain management: pain level controlled Vital Signs Assessment: post-procedure vital signs reviewed and stable Respiratory status: patient remains intubated per anesthesia plan and patient on ventilator - see flowsheet for VS Cardiovascular status: stable Anesthetic complications: no    Last Vitals:  Vitals:   03/17/19 0734 03/17/19 0745  BP: 102/62 (!) 106/58  Pulse: (!) 108 (!) 102  Resp: 20 17  Temp:  (!) 38.2 C  SpO2:  94%    Last Pain:  Vitals:   03/17/19 0000  TempSrc: Oral  PainSc:                  Nolon Nations

## 2019-03-17 NOTE — Progress Notes (Signed)
[address]       [place] [address]             [phone]                 1 Day Post-Op Procedure(s) (LRB): CORONARY ARTERY BYPASS GRAFTING (CABG), ON PUMP, TIMES THREE, USING LEFT INTERNAL MAMMARY ARTERY, RIGHT GREATER SAPHENOUS VEIN HARVESTED ENDOSCOPICALLY (N/A) MAZE (N/A) CLIPPING OF LEFT  ATRIAL APPENDAGE - USING ATRICLIP SIZE 40 (N/A) TRANSESOPHAGEAL ECHOCARDIOGRAM (TEE) (N/A)   Events: Failed to extubate yest due to tachypnea.  Currently on vent wean _______________________________________________________________ Vitals: BP 100/62   Pulse 98   Temp 99.9 F (37.7 C)   Resp (!) 21   Ht 5\' 8"  (1.727 m)   Wt 96.1 kg   SpO2 93%   BMI 32.21 kg/m   - Neuro: alert, follows commands  - Cardiovascular: sinus  Drips: milr 0.25, epi 2 levo2.   PAP: (28-51)/(12-21) 36/17 CVP:  [0 mmHg-17 mmHg] 12 mmHg CO:  [3.1 L/min-4.9 L/min] 4 L/min CI:  [1.6 L/min/m2-2.4 L/min/m2] 2 L/min/m2  - Pulm: coarse B Vent Mode: CPAP;PSV FiO2 (%):  [40 %-50 %] 40 % Set Rate:  [4 bmp-16 bmp] 4 bmp Vt Set:  [540 mL] 540 mL PEEP:  [5 cmH20] 5 cmH20 Pressure Support:  [10 cmH20] 10 cmH20 Plateau Pressure:  [17 cmH20-19 cmH20] 19 cmH20  ABG    Component Value Date/Time   PHART 7.327 (L) 03/17/2019 0348   PCO2ART 44.3 03/17/2019 0348   PO2ART 89.0 03/17/2019 0348   HCO3 22.8 03/17/2019 0348   TCO2 24 03/17/2019 0348   ACIDBASEDEF 3.0 (H) 03/17/2019 0348   O2SAT 95.0 03/17/2019 0348    - Abd: soft - Extremity: trace edema  .Intake/Output      01/29 0701 - 01/30 0700 01/30 0701 - 01/31 0700   P.O.     I.V. (mL/kg) 5355.5 (55.7) 200.3 (2.1)   Blood 1003    Other 10    IV Piggyback 1430.6 60.2   Total Intake(mL/kg) 7799.1 (81.2) 260.5 (2.7)   Urine (mL/kg/hr) 1705 (0.7) 165 (0.4)   Emesis/NG output 0 0   Blood 800    Chest Tube 260 60   Total Output 2765 225   Net +5034.1 +35.5            _______________________________________________________________ Labs: CBC Latest Ref Rng & Units 03/17/2019 03/17/2019 03/16/2019  WBC 4.0 - 10.5 K/uL - 14.6(H) -  Hemoglobin 13.0 - 17.0 g/dL 8.8(L) 7.7(L) 7.8(L)  Hematocrit 39.0 - 52.0 % 26.0(L) 25.2(L) 23.0(L)  Platelets 150 - 400 K/uL - 133(L) -   CMP Latest Ref Rng & Units 03/17/2019 03/17/2019 03/16/2019  Glucose 70 - 99 mg/dL - 163(H) 186(H)  BUN 8 - 23 mg/dL - 13 13  Creatinine 0.61 - 1.24 mg/dL - 1.03 0.90  Sodium 135 - 145 mmol/L 137 135 136  Potassium 3.5 - 5.1 mmol/L 4.6 4.6 4.8  Chloride 98 - 111 mmol/L - 103 102  CO2 22 - 32 mmol/L - 24 -  Calcium 8.9 - 10.3 mg/dL - 8.1(L) -  Total Protein 6.5 - 8.1 g/dL - - -  Total Bilirubin 0.3 - 1.2 mg/dL - - -  Alkaline Phos 38 - 126 U/L - - -  AST 15 - 41 U/L - - -  ALT 0 - 44 U/L - - -    CXR: clear  _______________________________________________________________  Assessment and Plan: POD 1 s/p CABG  Neuro:  on vent wean CV: will wean epi first, then levo.  Pacer at back up rate Pulm: wean to extubated Renal: stable.   GI: NPO for now.  Will advance once extubated Heme: stable ID: [email or web address] Endo: SSI once extubated  Dispo: continue ICU care  Melodie Bouillon, MD 03/17/2019 11:40 AM

## 2019-03-17 NOTE — Op Note (Signed)
Murphy, Jesse Murphy MEDICAL RECORD OF:[id number] ACCOUNT [id number] DATE OF BIRTH:October 18, 1928 FACILITY: [facility] LOCATION: [facility] PHYSICIAN:Lanny Lipkin VAN TRIGT III, MD  OPERATIVE REPORT  DATE OF PROCEDURE:  03/16/2019  SURGEON:  Ivin Poot III, MD  OPERATION: 1.  Coronary artery bypass grafting x3 (left internal mammary artery to left anterior descending, saphenous vein graft to obtuse marginal 1, saphenous vein graft to posterior descending). 2.  Left atrial maze procedure with pulmonary vein isolation using the AtriCure radiofrequency ablation. 3.  Application of 35 mm left atrial appendage AtriCure clip. 4.  Endoscopic harvest of right leg greater saphenous vein.  PREOPERATIVE DIAGNOSES:   1.  Non-ST elevation myocardial infarction with left main and severe 3-vessel coronary artery disease. 1.  Moderate to severe left ventricular dysfunction, low cardiac output  preoperatively with elevated wedge 30 mmHg. 2.  Rapid atrial fibrillation, symptomatic, requiring direct current cardioversion x2 on presentation in the emergency department without successful restoration of sinus rhythm. 3.  History of B-cell lymphoma and prostate cancer.  CLINICAL NOTE:  The patient is 86 years old and presented to the ER at an outside hospital with symptoms of heart failure, chest pain, dizziness and presyncope.  He was found to be in rapid atrial fibrillation, hypertensive, with positive cardiac  enzymes.  Echocardiogram showed moderate to severe LV dysfunction.  Attempt at cardioversion was unsuccessful to sinus rhythm.  He was transferred to this hospital for cardiac catheterization because of positive enzymes.  This showed severe left main and  3-vessel CAD with poor LV function.  He remained in rapid atrial fibrillation on IV amiodarone.  He was felt to be a candidate for surgical coronary revascularization.  After reviewing his angiogram and echocardiogram, I agreed that high-risk CABG would  be his best  long-term therapy.  I also felt that a combined maze procedure would provide him benefit for potential restoration of sinus rhythm.  He understood that the operation was at increased risk due to his advanced age, history of lymphoma, poor LV  function and active ongoing symptoms of heart failure.  He understood the risks of stroke, bleeding, blood transfusion, infection, organ failure and death.  He agreed to proceed with surgery  after reviewing these issues and he demonstrated an  appropriate understanding of the situation.  OPERATIVE FINDINGS: 1.  The patient was in low output cardiac shock.  Upon presentation to the operating room, was immediately placed on milrinone and norepinephrine.  Echo showed no significant valvular disease, but global biventricular dysfunction. 2.  Adequate conduit for grafting. 3.  Adequate targets. 4.  Improved global LV function after surgical revascularization and treatment of atrial fibrillation with pulmonary vein left atrial maze isolation.  DESCRIPTION OF PROCEDURE:  The patient was brought to the operating room from preoperative holding after operative informed consent was documented and final issues addressed.  The patient was placed supine on the operating table and intubated and placed  under general anesthesia.  He remained stable.  His PA catheter readings, however, revealed low cardiac output of 2.4, index of 1.2.  His blood pressure was soft and PAD was 30.  He was started on milrinone and Levophed, prepped and draped and a proper  time-out was performed.  A sternal incision was made and the mammary artery harvested.  The right greater saphenous vein was harvested using endoscopic technique.  The sternal retractor was placed and the pericardium was opened.  There was a pericardial  effusion, which was drained.  Heparin was administered and pursestrings  were placed in the ascending aorta and right atrium.  When the ACT was documented as being therapeutic, the  patient was cannulated and placed on bypass.  The coronaries were  identified for grafting and the mammary artery and vein grafts were prepared for the distal anastomoses.  Cardioplegic cannulas were placed for both antegrade and retrograde cold blood cardioplegia.  CO2 was insufflated into the operative field.  The  patient was cooled to 32 degrees.  The aortic crossclamp was applied and a liter of cold blood cardioplegia was delivered in split doses between the antegrade aortic and retrograde coronary sinus catheters.  There was good cardioplegic arrest and supple  temperature dropped less than 15 degrees.  Cardioplegia was delivered every 20 minutes.  First, attention was made to the maze procedure.  The heart was elevated and the ligament of Ruthann Cancer taken down and the left-sided pulmonary vein and left atrial cuff were encircled with a vessel loop.  Using the RF ablation AtriCure clamp, 2 ablation  lines were placed around the pulmonary veins on the left side.  Attention was then directed to the right-sided pulmonary veins.  The superior pulmonary vein was dissected off the right pulmonary artery and using careful dissection, the right-sided pulmonary veins were encircled with a vessel loop.  The AtriCure  atrial ablation radiofrequency clamp was then placed on the right-sided atrial veins and atrial cuff and 2 ablation lines were performed.  Cardioplegia was redosed.  We then did the distal coronary anastomoses.  The first distal anastomosis was the posterior descending.  There was a proximal 95% stenosis.  Reverse saphenous vein was sewn end-to-side with running 7-0 Prolene with good flow through the graft.   Cardioplegia was redosed.  The second distal anastomosis was the OM1 branch of the left circumflex.  This was a proximal left main 90% stenosis.  Reverse saphenous vein was sewn end-to-side with running 7-0 Prolene with good flow through the graft.  Cardioplegia was redosed.  The third  distal anastomosis was the midportion of the LAD.  There was a 90% proximal stenosis.  The left IMA pedicle was brought through an opening in the left lateral pericardium.  It was brought down onto the LAD and sewn end-to-side with running 8-0  Prolene.  There was good flow through the anastomosis after briefly releasing the pedicle bulldog on the mammary artery.  The bulldog was reapplied and the pedicle was secured to the epicardium with 6-0 Prolenes.  Cardioplegia was redosed.  We then placed the clip on the left atrial appendage.  This measured at 35 mm of the space.  The 35 mm clip was placed with good occlusion of the atrial appendage.  We then performed the 2 proximal vein anastomoses to the posterior descending and the circumflex with a 4.5 mm punch and running 6-0 Prolene.  While the crossclamp was in place, we also placed a 10 mm Vascutek graft on the ascending aorta in the event  that percutaneous mechanical support (Impella 5.5 catheter) would be needed to support the patient's poor ventricular function.  Air was vented from the left side of the heart with a dose of retrograde warm blood cardioplegia in the usual de-airing maneuvers and the crossclamp was removed.  A clamp was placed on the 10 mm Dacron graft.  The vein grafts were deaired and opened.  Each had good flow.  Hemostasis was documented at the proximal and distal anastomoses.  The patient had a regular rhythm and did not require cardioversion  and was in a non-AFib rhythm.  Temporary pacing wires had been placed and the patient was rewarmed and reperfused.  The lungs were reexpanded and both pleural spaces drained of pleural effusions.  The patient was started on milrinone, epinephrine and norepinephrine.  LV function  appeared to be improved.  We transitioned off cardiopulmonary bypass without difficulty.  Echo and direct visualization showed a significant improvement in biventricular function.  Cardiac output was now 3.84 L  per minute.  We did not need the Impella  catheter, so the Dacron graft was ligated and divided close to its anastomosis to the aorta.  Protamine had been administered without adverse reaction.  The cannulas were removed.  The patient remained stable.  The mediastinum was irrigated.  The anterior mediastinal fat was closed over the aorta.  Bilateral pleural tubes and anterior mediastinal  tubes were placed and brought out through separate incisions.  The sternum was closed with wire.  The patient remained stable.  The pectoralis fascia was closed with a running #1 Vicryl.  The subcutaneous and skin layers were closed with running Vicryl.   Total cardiopulmonary bypass time was 158 minutes.  VN/NUANCE  D:03/17/2019 T:03/17/2019 JOB:[id number]

## 2019-03-17 NOTE — Progress Notes (Signed)
Attempted to turn rate down to 4 to wean pt again and within 1 minute, pt RR increased 36-38 and increased WOB. Will make RN aware.

## 2019-03-17 NOTE — Procedures (Signed)
Extubation Procedure Note  Patient Details:   Name: Jesse Murphy DOB: Nov 19, 1928 MRN: [id number]   Airway Documentation:    Vent end date: 03/17/19 Vent end time: 1230   Evaluation  O2 sats: stable throughout Complications: No apparent complications Patient did tolerate procedure well. Bilateral Breath Sounds: Clear   Yes   Patient extubated to [place]. NIF-30 VC1.1L. Patient is tolerating well. RT will continue to monitor.  Mcneil Sober 03/17/2019, 12:40 PM

## 2019-03-17 NOTE — Progress Notes (Signed)
Progress Note  Patient Name: Jesse Murphy Date of Encounter: 03/17/2019  Primary Cardiologist: Kate Sable, MD   Subjective   Pt awake   Intubated   Wants Ice   Inpatient Medications    Scheduled Meds: . acetaminophen  1,000 mg Oral Q6H   Or  . acetaminophen (TYLENOL) oral liquid 160 mg/5 mL  1,000 mg Per Tube Q6H  . aspirin EC  325 mg Oral Daily   Or  . aspirin  324 mg Per Tube Daily  . atorvastatin  80 mg Oral q1800  . bisacodyl  10 mg Oral Daily   Or  . bisacodyl  10 mg Rectal Daily  . chlorhexidine gluconate (MEDLINE KIT)  15 mL Mouth Rinse BID  . Chlorhexidine Gluconate Cloth  6 each Topical Daily  . docusate sodium  200 mg Oral Daily  . mouth rinse  15 mL Mouth Rinse 10 times per day  . metoCLOPramide (REGLAN) injection  10 mg Intravenous Q6H  . metoprolol tartrate  12.5 mg Oral BID   Or  . metoprolol tartrate  12.5 mg Per Tube BID  . [START ON 03/18/2019] pantoprazole  40 mg Oral Daily  . sodium chloride flush  10-40 mL Intracatheter Q12H  . sodium chloride flush  3 mL Intravenous Q12H   Continuous Infusions: . sodium chloride    . sodium chloride    . sodium chloride 10 mL/hr (03/16/19 1710)  . albumin human 12.5 g (03/16/19 2043)  . amiodarone Stopped (03/16/19 1711)  . cefUROXime (ZINACEF)  IV 1.5 g (03/17/19 0510)  . dexmedetomidine (PRECEDEX) IV infusion 0.3 mcg/kg/hr (03/17/19 0500)  . EPINEPHrine 4 mg in dextrose 5% 250 mL infusion (16 mcg/mL) 2 mcg/min (03/17/19 0500)  . famotidine (PEPCID) IV Stopped (03/16/19 2306)  . insulin 2.4 Units/hr (03/17/19 0516)  . lactated ringers    . lactated ringers    . lactated ringers 20 mL/hr at 03/17/19 0500  . milrinone 0.25 mcg/kg/min (03/17/19 0500)  . norepinephrine (LEVOPHED) Adult infusion 3 mcg/min (03/17/19 0500)   PRN Meds: sodium chloride, albumin human, dextrose, lactated ringers, metoprolol tartrate, midazolam, morphine injection, ondansetron (ZOFRAN) IV, oxyCODONE, sodium chloride flush,  sodium chloride flush, traMADol   Vital Signs    Vitals:   03/17/19 0715 03/17/19 0730 03/17/19 0734 03/17/19 0745  BP: (!) 103/59 102/62 102/62 (!) 106/58  Pulse: 100 (!) 102 (!) 108 (!) 102  Resp: '18 19 20 17  ' Temp: (!) 100.9 F (38.3 C) (!) 100.8 F (38.2 C)  (!) 100.8 F (38.2 C)  TempSrc:      SpO2: 95% 95%  94%  Weight:      Height:        Intake/Output Summary (Last 24 hours) at 03/17/2019 0806 Last data filed at 03/17/2019 0800 Gross per 24 hour  Intake 7741.49 ml  Output 2905 ml  Net 4836.49 ml   Last 3 Weights 03/17/2019 03/16/2019 03/15/2019  Weight (lbs) 211 lb 13.8 oz 193 lb 4.8 oz 196 lb 3.2 oz  Weight (kg) 96.1 kg 87.68 kg 88.996 kg      Telemetry    SR and VT (limiited x 1)- Personally Reviewed  ECG    ST 101  Anteroseptal MI  Nonspecific ST chagnes  - Personally Reviewed  Physical Exam   GEN: No acute distress.   Neck: Unable to assess JVP   Cardiac: RRR, no murmurs  Distant   Respiratory: Mild rhonchi bilaterally   GI: Soft, nontender, non-distended  MS: Tr edema;  No deformity.  Warm   Neuro:  Nonfocal  Psych: Normal affect   Labs    High Sensitivity Troponin:   Recent Labs  Lab 03/13/19 1056 03/13/19 1251 03/13/19 2257 03/14/19 0402  TROPONINIHS 32* 440* 926* 585*      Chemistry Recent Labs  Lab 03/15/19 1453 03/15/19 1453 03/16/19 0444 03/16/19 1050 03/16/19 2238 03/16/19 2238 03/16/19 2245 03/17/19 0317 03/17/19 0348  NA 136   < > 135   < > 137   < > 136 135 137  K 4.1   < > 3.5   < > 4.8   < > 4.8 4.6 4.6  CL 100   < > 95*   < > 106  --  102 103  --   CO2 26   < > 28  --  23  --   --  24  --   GLUCOSE 144*   < > 129*   < > 183*  --  186* 163*  --   BUN 15   < > 15   < > 12  --  13 13  --   CREATININE 1.02   < > 1.08   < > 1.01  --  0.90 1.03  --   CALCIUM 8.7*   < > 8.6*  --  8.1*  --   --  8.1*  --   PROT 6.3*  --   --   --   --   --   --   --   --   ALBUMIN 3.3*  --   --   --   --   --   --   --   --   AST 18  --    --   --   --   --   --   --   --   ALT 10  --   --   --   --   --   --   --   --   ALKPHOS 56  --   --   --   --   --   --   --   --   BILITOT 0.7  --   --   --   --   --   --   --   --   GFRNONAA >60   < > >60  --  >60  --   --  >60  --   GFRAA >60   < > >60  --  >60  --   --  >60  --   ANIONGAP 10   < > 12  --  8  --   --  8  --    < > = values in this interval not displayed.     Hematology Recent Labs  Lab 03/16/19 1722 03/16/19 1724 03/16/19 2238 03/16/19 2238 03/16/19 2245 03/17/19 0317 03/17/19 0348  WBC 21.7*  --  16.9*  --   --  14.6*  --   RBC 2.71*  --  2.72*  --   --  2.66*  --   HGB 8.0*   < > 8.0*   < > 7.8* 7.7* 8.8*  HCT 25.6*   < > 25.6*   < > 23.0* 25.2* 26.0*  MCV 94.5  --  94.1  --   --  94.7  --   MCH 29.5  --  29.4  --   --  28.9  --  MCHC 31.3  --  31.3  --   --  30.6  --   RDW 14.4  --  14.3  --   --  14.5  --   PLT PLATELET CLUMPS NOTED ON SMEAR, COUNT APPEARS DECREASED  --  127*  --   --  133*  --    < > = values in this interval not displayed.    BNPNo results for input(s): BNP, PROBNP in the last 168 hours.   DDimer No results for input(s): DDIMER in the last 168 hours.   Radiology    DG Chest 2 View  Result Date: 03/15/2019 CLINICAL DATA:  Shortness of breath with exertion. EXAM: CHEST - 2 VIEW COMPARISON:  March 13, 2019 FINDINGS: Mild, chronic appearing increased lung markings are seen. There is no evidence of acute infiltrate, pleural effusion or pneumothorax. The heart size and mediastinal contours are within normal limits. There is moderate severity calcification of the aortic arch. The visualized skeletal structures are unremarkable. IMPRESSION: 1. Chronic appearing increased lung markings without evidence of acute or active cardiopulmonary disease. Electronically Signed   By: Virgina Norfolk M.D.   On: 03/15/2019 17:45   CARDIAC CATHETERIZATION  Result Date: 03/15/2019  Dist LM to Ost LAD lesion is 80% stenosed.  Ost LM to Dist LM  lesion is 50% stenosed.  Mid Cx lesion is 30% stenosed.  Prox RCA-1 lesion is 60% stenosed.  Prox RCA-2 lesion is 99% stenosed.  1. Severe calcific stenosis in the distal left main, ostial LAD and mid RCA 2. Elevated filling pressures Recommendations: He has a severe, heavily calcified stenosis in the mid RCA. He also has a heavily calcified distal left main, ostial LAD stenosis. Revascularization with PCI would be high risk and require atherectomy in the ostial LAD extending back into the left main with chance of obstruction of flow into the Circumflex. He is 86 years old and would be high risk for CABG as well but he is overall a very functional patient for his age. I will ask CT surgery to see him to discuss CABG. He will need optimization of his heart rate control and diuresis. I will start Lasix 40 mg IV BID.   DG Chest Port 1 View  Result Date: 03/16/2019 CLINICAL DATA:  Post CABG x3. EXAM: PORTABLE CHEST 1 VIEW COMPARISON:  03/15/2019 FINDINGS: Sternotomy wires are present. Endotracheal tube has tip 5.1 cm above the carina. Enteric tube courses into the stomach and off the film as tip is not visualized. Right IJ Swan-Ganz catheter has tip over the main pulmonary artery segment. Mediastinal drain and bilateral chest tubes are in adequate position. Lungs are adequately inflated with minimal left basilar opacification likely atelectasis and possible small amount left pleural fluid. Minimal prominence of the perihilar markings likely mild vascular congestion. No evidence of pneumothorax. Cardiomediastinal silhouette and remainder of the exam is unchanged. IMPRESSION: 1. Mild left base opacification likely atelectasis and possible small amount of pleural fluid. Evidence of mild vascular congestion. 1. Tubes and lines as described. Electronically Signed   By: Marin Olp M.D.   On: 03/16/2019 18:45   ECHO INTRAOPERATIVE TEE  Result Date: 03/16/2019  *INTRAOPERATIVE TRANSESOPHAGEAL REPORT *  Patient  Name:   Jesse Murphy   Date of Exam: 03/16/2019 Medical Rec #:  [id number]      Height:       68.0 in Accession #:    [id number]     Weight:  193.3 lb Date of Birth:  08-Sep-1928     BSA:          2.01 m Patient Age:    5 years       BP:           103/68 mmHg Patient Gender: M              HR:           103 bpm. Exam Location:  Anesthesiology Transesophogeal exam was perform intraoperatively during surgical procedure. Patient was closely monitored under general anesthesia during the entirety of examination. Indications:     CAD Performing Phys: Stevenson TRIGT Diagnosing Phys: Nolon Nations MD Complications: No known complications during this procedure. POST-OP IMPRESSIONS - Left Ventricle: has mildly reduced systolic function, with an ejection fraction of 45%. The cavity size was mildly dilated. - Aorta: The aorta appears unchanged from pre-bypass. - Left Atrial Appendage: The left atrial appendage appears unchanged from pre-bypass. - Aortic Valve: The aortic valve appears unchanged from pre-bypass. - Mitral Valve: The mitral valve appears unchanged from pre-bypass. - Tricuspid Valve: The tricuspid valve appears unchanged from pre-bypass. PRE-OP FINDINGS  Left Ventricle: The left ventricle has moderate-severely reduced systolic function, with an ejection fraction of 30-35%. The cavity size was mildly dilated. There is no increase in left ventricular wall thickness. Right Ventricle: The right ventricle has mildly reduced systolic function. The cavity was mildly enlarged. There is no increase in right ventricular wall thickness. Left Atrium: Left atrial size was dilated. The left atrial appendage is well visualized and there is no evidence of thrombus present. Right Atrium: Right atrial size was normal in size. Interatrial Septum: No atrial level shunt detected by color flow Doppler. Pericardium: There is no evidence of pericardial effusion. Mitral Valve: The mitral valve is normal in structure. Mild  thickening of the mitral valve leaflet. Mitral valve regurgitation is mild by color flow Doppler. The MR jet is centrally-directed. Tricuspid Valve: The tricuspid valve was normal in structure. Tricuspid valve regurgitation is trivial by color flow Doppler. Aortic Valve: The aortic valve is tricuspid Aortic valve regurgitation was not visualized by color flow Doppler. There is no evidence of aortic valve stenosis. Pulmonic Valve: The pulmonic valve was normal in structure. Pulmonic valve regurgitation is trivial by color flow Doppler. +-------------+-----------++ AORTIC VALVE             +-------------+-----------++ AV Vmax:     129.50 cm/s +-------------+-----------++ AV Vmean:    90.150 cm/s +-------------+-----------++ AV VTI:      0.284 m     +-------------+-----------++ AV Peak Grad:6.7 mmHg    +-------------+-----------++ AV Mean Grad:4.0 mmHg    +-------------+-----------++  Nolon Nations MD Electronically signed by Nolon Nations MD Signature Date/Time: 03/16/2019/4:55:09 PM    Final    VAS US DOPPLER PRE CABG  Result Date: 03/15/2019 PREOPERATIVE VASCULAR EVALUATION  Indications:      Pre-CABG. Risk Factors:     Hypertension, hyperlipidemia, coronary artery disease. Other Factors:    A. Fib. Limitations:      Irregular heartbeat. Comparison Study: No prior study. Performing Technologist: Baldwin Crown RVT, RDMS  Examination Guidelines: A complete evaluation includes B-mode imaging, spectral Doppler, color Doppler, and power Doppler as needed of all accessible portions of each vessel. Bilateral testing is considered an integral part of a complete examination. Limited examinations for reoccurring indications may be performed as noted.  Right Carotid Findings: +----------+--------+--------+--------+------------+--------+  PSV cm/sEDV cm/sStenosisDescribe    Comments +----------+--------+--------+--------+------------+--------+ CCA Prox  69      16               heterogenous         +----------+--------+--------+--------+------------+--------+ CCA Distal50      20                                   +----------+--------+--------+--------+------------+--------+ ICA Prox  81      25      1-39%   heterogenous         +----------+--------+--------+--------+------------+--------+ ICA Distal102     28                                   +----------+--------+--------+--------+------------+--------+ ECA       128     18                                   +----------+--------+--------+--------+------------+--------+ +----------+--------+-------+----------------+------------+           PSV cm/sEDV cmsDescribe        Arm Pressure +----------+--------+-------+----------------+------------+ [id number]            Multiphasic, WNL             +----------+--------+-------+----------------+------------+ +---------+--------+--+--------+-+---------+ VertebralPSV cm/s47EDV cm/s9Antegrade +---------+--------+--+--------+-+---------+ Left Carotid Findings: +----------+--------+--------+--------+------------+------------------+           PSV cm/sEDV cm/sStenosisDescribe    Comments           +----------+--------+--------+--------+------------+------------------+ CCA Prox  75      20              heterogenousintimal thickening +----------+--------+--------+--------+------------+------------------+ CCA Distal56      15                                             +----------+--------+--------+--------+------------+------------------+ ICA Prox  66      22      1-39%   heterogenous                   +----------+--------+--------+--------+------------+------------------+ ICA Distal67      22                                             +----------+--------+--------+--------+------------+------------------+ ECA       125     13                                              +----------+--------+--------+--------+------------+------------------+ +----------+--------+--------+----------------+------------+ SubclavianPSV cm/sEDV cm/sDescribe        Arm Pressure +----------+--------+--------+----------------+------------+           183             Multiphasic, WNL             +----------+--------+--------+----------------+------------+ +---------+--------+--+--------+--+---------+ VertebralPSV cm/s55EDV cm/s18Antegrade +---------+--------+--+--------+--+---------+  ABI Findings: +--------+------------------+-----+---------+--------+ Right   Rt Pressure (mmHg)IndexWaveform Comment  +--------+------------------+-----+---------+--------+ Brachial  triphasic         +--------+------------------+-----+---------+--------+ PTA                            triphasic         +--------+------------------+-----+---------+--------+ DP                             triphasic         +--------+------------------+-----+---------+--------+ +--------+------------------+-----+---------+-------+ Left    Lt Pressure (mmHg)IndexWaveform Comment +--------+------------------+-----+---------+-------+ Brachial                       triphasic        +--------+------------------+-----+---------+-------+ PTA                            triphasic        +--------+------------------+-----+---------+-------+ DP                             triphasic        +--------+------------------+-----+---------+-------+  Right Doppler Findings: +--------+--------+-----+---------+--------+ Site    PressureIndexDoppler  Comments +--------+--------+-----+---------+--------+ Brachial             triphasic         +--------+--------+-----+---------+--------+ Radial               triphasic         +--------+--------+-----+---------+--------+ Ulnar                triphasic         +--------+--------+-----+---------+--------+  Left  Doppler Findings: +--------+--------+-----+---------+--------+ Site    PressureIndexDoppler  Comments +--------+--------+-----+---------+--------+ Brachial             triphasic         +--------+--------+-----+---------+--------+ Radial               triphasic         +--------+--------+-----+---------+--------+ Ulnar                triphasic         +--------+--------+-----+---------+--------+  Summary: Right Carotid: Velocities in the right ICA are consistent with a 1-39% stenosis.                The extracranial vessels were near-normal with only minimal wall                thickening or plaque. Left Carotid: Velocities in the left ICA are consistent with a 1-39% stenosis.               The extracranial vessels were near-normal with only minimal wall               thickening or plaque. Vertebrals:  Bilateral vertebral arteries demonstrate antegrade flow. Subclavians: Normal flow hemodynamics were seen in bilateral subclavian              arteries. Right Upper Extremity: Doppler waveforms remain within normal limits with right radial compression. Doppler waveforms decrease >50% with right ulnar compression. Left Upper Extremity: Doppler waveforms decrease >50% with left radial compression. Doppler waveforms decrease >50% with left ulnar compression.  Electronically signed by Monica Martinez MD on 03/15/2019 at 4:26:22 PM.    Final     Cardiac Studies   Cath 03/15/19   Dist LM to Ost LAD lesion is 80% stenosed.  Ost LM  to Dist LM lesion is 50% stenosed.  Mid Cx lesion is 30% stenosed.  Prox RCA-1 lesion is 60% stenosed.  Prox RCA-2 lesion is 99% stenosed.   1. Severe calcific stenosis in the distal left main, ostial LAD and mid RCA 2. Elevated filling pressures  Echocardiogram: 03/13/2019 IMPRESSIONS   1. Left ventricular ejection fraction, by visual estimation, is 30 to 35%. The left ventricle has moderate to severely decreased function. There is mildly increased  left ventricular hypertrophy. 2. Left ventricular diastolic parameters are indeterminate in the setting of atrial fibrillation. 3. The left ventricle demonstrates global hypokinesis with paradoxical septal motion. 4. Global right ventricle has mildly reduced systolic function.The right ventricular size is normal. No increase in right ventricular wall thickness. 5. Left atrial size was upper normal. 6. Right atrial size was normal. 7. Mild mitral annular calcification. 8. The mitral valve is grossly normal. Trivial mitral valve regurgitation. 9. The tricuspid valve is grossly normal. Tricuspid valve regurgitation is trivial. 10. The aortic valve is tricuspid. Aortic valve regurgitation is not visualized. Mild aortic valve sclerosis without stenosis. 11. The pulmonic valve was grossly normal. Pulmonic valve regurgitation is trivial. 12. TR signal is inadequate for assessing pulmonary artery systolic pressure. 13. The inferior vena cava is dilated in size with <50% respiratory variability, suggesting right atrial pressure of 15 mmHg.    Patient Profile     Jesse Murphy is a 86 y.o. male with past medical history of CAD (s/p PCI in 1992, significant CAD by Coronary CT in 01/2018 --> catheterization not pursued at that time given no recent symptoms), chronic diastolic CHF, paroxysmal atrial fibrillation, HTN, HLD, follicular lymphoma and history of prostate cancer who is being seen today for the evaluation of atrial fibrillation with RVR at the request of Dr. Denton Brick.   Assessment & Plan    1  CAD Pt is now severe CAD   He is POD 1 from CABG x3 (LIMA to LAD; SVG to OM1; SVG to PDA)  Alos MAZE and LA clipping.     On pressors / milrinone    2 Atrial fibrillation   Pt is s/p MAZE and LAA clipping   Follow   No Afib   One burst VT   Pt on amiodarone IV    3  HL  On 80 lipitor    4.  Anemia  Pt receiving pRBC    For questions or updates, please contact [place] Please consult  [email or web address] for contact info under        Signed, Dorris Carnes, MD  03/17/2019, 8:06 AM

## 2019-03-18 ENCOUNTER — Other Ambulatory Visit: Payer: Self-pay

## 2019-03-18 LAB — BASIC METABOLIC PANEL
Anion gap: 9 (ref 5–15)
BUN: 24 mg/dL — ABNORMAL HIGH (ref 8–23)
CO2: 24 mmol/L (ref 22–32)
Calcium: 8.2 mg/dL — ABNORMAL LOW (ref 8.9–10.3)
Chloride: 103 mmol/L (ref 98–111)
Creatinine, Ser: 1.44 mg/dL — ABNORMAL HIGH (ref 0.61–1.24)
GFR calc Af Amer: 50 mL/min — ABNORMAL LOW (ref >60)
GFR calc non Af Amer: 43 mL/min — ABNORMAL LOW (ref >60)
Glucose, Bld: 107 mg/dL — ABNORMAL HIGH (ref 70–99)
Potassium: 4.1 mmol/L (ref 3.5–5.1)
Sodium: 136 mmol/L (ref 135–145)

## 2019-03-18 LAB — GLUCOSE, CAPILLARY
Glucose-Capillary: 104 mg/dL — ABNORMAL HIGH (ref 70–99)
Glucose-Capillary: 96 mg/dL (ref 70–99)
Glucose-Capillary: 156 mg/dL — ABNORMAL HIGH (ref 70–99)
Glucose-Capillary: 161 mg/dL — ABNORMAL HIGH (ref 70–99)
Glucose-Capillary: 163 mg/dL — ABNORMAL HIGH (ref 70–99)
Glucose-Capillary: 136 mg/dL — ABNORMAL HIGH (ref 70–99)
Glucose-Capillary: 329 mg/dL — ABNORMAL HIGH (ref 70–99)
Glucose-Capillary: 360 mg/dL — ABNORMAL HIGH (ref 70–99)
Glucose-Capillary: 247 mg/dL — ABNORMAL HIGH (ref 70–99)
Glucose-Capillary: 117 mg/dL — ABNORMAL HIGH (ref 70–99)

## 2019-03-18 LAB — CBC
HCT: 26.4 % — ABNORMAL LOW (ref 39.0–52.0)
Hemoglobin: 8.4 g/dL — ABNORMAL LOW (ref 13.0–17.0)
MCH: 29.4 pg (ref 26.0–34.0)
MCHC: 31.8 g/dL (ref 30.0–36.0)
MCV: 92.3 fL (ref 80.0–100.0)
Platelets: 124 10*3/uL — ABNORMAL LOW (ref 150–400)
RBC: 2.86 MIL/uL — ABNORMAL LOW (ref 4.22–5.81)
RDW: 14.7 % (ref 11.5–15.5)
WBC: 18 10*3/uL — ABNORMAL HIGH (ref 4.0–10.5)
nRBC: 0 % (ref 0.0–0.2)

## 2019-03-18 LAB — COOXEMETRY PANEL
Carboxyhemoglobin: 1.1 % (ref 0.5–1.5)
Methemoglobin: 1.1 % (ref 0.0–1.5)
O2 Saturation: 47.3 %
Total hemoglobin: 11.7 g/dL — ABNORMAL LOW (ref 12.0–16.0)

## 2019-03-18 MED ORDER — INSULIN ASPART 100 UNIT/ML ~~LOC~~ SOLN
0.0000 [IU] | SUBCUTANEOUS | Status: DC
  Administered 2019-03-18: 2 [IU] via SUBCUTANEOUS
  Administered 2019-03-18: 8 [IU] via SUBCUTANEOUS
  Administered 2019-03-19 – 2019-03-21 (×12): 2 [IU] via SUBCUTANEOUS
  Administered 2019-03-21: 4 [IU] via SUBCUTANEOUS
  Administered 2019-03-21 (×2): 2 [IU] via SUBCUTANEOUS
  Administered 2019-03-21 – 2019-03-22 (×5): 4 [IU] via SUBCUTANEOUS
  Administered 2019-03-22 (×2): 8 [IU] via SUBCUTANEOUS
  Administered 2019-03-22 (×2): 4 [IU] via SUBCUTANEOUS
  Administered 2019-03-23: 8 [IU] via SUBCUTANEOUS
  Administered 2019-03-23: 2 [IU] via SUBCUTANEOUS
  Administered 2019-03-23 (×3): 4 [IU] via SUBCUTANEOUS
  Administered 2019-03-23: 2 [IU] via SUBCUTANEOUS
  Administered 2019-03-24: 4 [IU] via SUBCUTANEOUS
  Administered 2019-03-24: 2 [IU] via SUBCUTANEOUS
  Administered 2019-03-24: 8 [IU] via SUBCUTANEOUS
  Administered 2019-03-24: 4 [IU] via SUBCUTANEOUS
  Administered 2019-03-24: 2 [IU] via SUBCUTANEOUS
  Administered 2019-03-24: 8 [IU] via SUBCUTANEOUS
  Administered 2019-03-25 (×2): 2 [IU] via SUBCUTANEOUS
  Administered 2019-03-25: 4 [IU] via SUBCUTANEOUS
  Administered 2019-03-25: 2 [IU] via SUBCUTANEOUS
  Administered 2019-03-25 (×2): 4 [IU] via SUBCUTANEOUS
  Administered 2019-03-26: 2 [IU] via SUBCUTANEOUS
  Administered 2019-03-26: 20:00:00 4 [IU] via SUBCUTANEOUS
  Administered 2019-03-26: 12 [IU] via SUBCUTANEOUS
  Administered 2019-03-26 (×2): 2 [IU] via SUBCUTANEOUS
  Administered 2019-03-27: 4 [IU] via SUBCUTANEOUS
  Administered 2019-03-27: 2 [IU] via SUBCUTANEOUS
  Administered 2019-03-27: 4 [IU] via SUBCUTANEOUS
  Administered 2019-03-28: 2 [IU] via SUBCUTANEOUS
  Administered 2019-03-28: 16:00:00 4 [IU] via SUBCUTANEOUS
  Administered 2019-03-28: 08:00:00 2 [IU] via SUBCUTANEOUS
  Administered 2019-03-28 (×2): 4 [IU] via SUBCUTANEOUS
  Administered 2019-03-29: 2 [IU] via SUBCUTANEOUS
  Administered 2019-03-29: 13:00:00 4 [IU] via SUBCUTANEOUS
  Administered 2019-03-29: 8 [IU] via SUBCUTANEOUS
  Administered 2019-03-29 (×2): 4 [IU] via SUBCUTANEOUS
  Administered 2019-03-29: 2 [IU] via SUBCUTANEOUS
  Administered 2019-03-29: 4 [IU] via SUBCUTANEOUS
  Administered 2019-03-30 (×5): 2 [IU] via SUBCUTANEOUS
  Administered 2019-03-30 – 2019-03-31 (×4): 4 [IU] via SUBCUTANEOUS
  Administered 2019-03-31: 2 [IU] via SUBCUTANEOUS
  Administered 2019-03-31 – 2019-04-01 (×2): 4 [IU] via SUBCUTANEOUS
  Administered 2019-04-01: 2 [IU] via SUBCUTANEOUS
  Administered 2019-04-01 (×3): 4 [IU] via SUBCUTANEOUS
  Administered 2019-04-01: 2 [IU] via SUBCUTANEOUS
  Administered 2019-04-02 (×2): 4 [IU] via SUBCUTANEOUS
  Administered 2019-04-02: 2 [IU] via SUBCUTANEOUS
  Administered 2019-04-02 (×3): 4 [IU] via SUBCUTANEOUS
  Administered 2019-04-02: 8 [IU] via SUBCUTANEOUS
  Administered 2019-04-03 (×2): 4 [IU] via SUBCUTANEOUS
  Administered 2019-04-03: 2 [IU] via SUBCUTANEOUS
  Administered 2019-04-03: 16:00:00 4 [IU] via SUBCUTANEOUS
  Administered 2019-04-03 (×2): 2 [IU] via SUBCUTANEOUS
  Administered 2019-04-04: 4 [IU] via SUBCUTANEOUS
  Administered 2019-04-04 (×2): 2 [IU] via SUBCUTANEOUS
  Administered 2019-04-04: 4 [IU] via SUBCUTANEOUS
  Administered 2019-04-05 (×4): 2 [IU] via SUBCUTANEOUS
  Administered 2019-04-05: 4 [IU] via SUBCUTANEOUS
  Administered 2019-04-06: 8 [IU] via SUBCUTANEOUS
  Administered 2019-04-06: 4 [IU] via SUBCUTANEOUS
  Administered 2019-04-06 (×2): 2 [IU] via SUBCUTANEOUS
  Administered 2019-04-06: 4 [IU] via SUBCUTANEOUS
  Administered 2019-04-07 (×3): 2 [IU] via SUBCUTANEOUS
  Administered 2019-04-07 (×2): 4 [IU] via SUBCUTANEOUS
  Administered 2019-04-07: 2 [IU] via SUBCUTANEOUS
  Administered 2019-04-08 (×4): 4 [IU] via SUBCUTANEOUS
  Administered 2019-04-08 – 2019-04-09 (×3): 2 [IU] via SUBCUTANEOUS
  Administered 2019-04-09: 12:00:00 4 [IU] via SUBCUTANEOUS
  Administered 2019-04-09 (×3): 2 [IU] via SUBCUTANEOUS
  Administered 2019-04-10 (×2): 4 [IU] via SUBCUTANEOUS
  Administered 2019-04-10 – 2019-04-16 (×8): 2 [IU] via SUBCUTANEOUS
  Administered 2019-04-16 – 2019-04-17 (×3): 4 [IU] via SUBCUTANEOUS
  Administered 2019-04-17 (×2): 2 [IU] via SUBCUTANEOUS
  Administered 2019-04-18: 4 [IU] via SUBCUTANEOUS
  Administered 2019-04-18: 2 [IU] via SUBCUTANEOUS
  Administered 2019-04-18: 8 [IU] via SUBCUTANEOUS
  Administered 2019-04-18 – 2019-04-19 (×4): 2 [IU] via SUBCUTANEOUS
  Administered 2019-04-19 (×2): 4 [IU] via SUBCUTANEOUS
  Administered 2019-04-19 – 2019-04-20 (×4): 2 [IU] via SUBCUTANEOUS
  Administered 2019-04-20: 8 [IU] via SUBCUTANEOUS
  Administered 2019-04-20: 2 [IU] via SUBCUTANEOUS
  Administered 2019-04-20: 8 [IU] via SUBCUTANEOUS
  Administered 2019-04-20 – 2019-04-21 (×4): 2 [IU] via SUBCUTANEOUS
  Administered 2019-04-21: 4 [IU] via SUBCUTANEOUS
  Administered 2019-04-21 (×2): 2 [IU] via SUBCUTANEOUS
  Administered 2019-04-22: 8 [IU] via SUBCUTANEOUS
  Administered 2019-04-22 (×2): 2 [IU] via SUBCUTANEOUS
  Administered 2019-04-22 – 2019-04-23 (×3): 8 [IU] via SUBCUTANEOUS
  Administered 2019-04-23 (×2): 2 [IU] via SUBCUTANEOUS
  Administered 2019-04-23 – 2019-04-24 (×2): 4 [IU] via SUBCUTANEOUS
  Administered 2019-04-24: 2 [IU] via SUBCUTANEOUS
  Administered 2019-04-24: 4 [IU] via SUBCUTANEOUS
  Administered 2019-04-25 – 2019-05-03 (×20): 2 [IU] via SUBCUTANEOUS
  Administered 2019-05-04: 4 [IU] via SUBCUTANEOUS
  Administered 2019-05-04: 2 [IU] via SUBCUTANEOUS
  Administered 2019-05-06: 4 [IU] via SUBCUTANEOUS
  Administered 2019-05-06: 2 [IU] via SUBCUTANEOUS
  Administered 2019-05-06: 8 [IU] via SUBCUTANEOUS
  Administered 2019-05-06 – 2019-05-07 (×3): 2 [IU] via SUBCUTANEOUS
  Administered 2019-05-07 (×3): 4 [IU] via SUBCUTANEOUS
  Administered 2019-05-07: 2 [IU] via SUBCUTANEOUS
  Administered 2019-05-07: 4 [IU] via SUBCUTANEOUS
  Administered 2019-05-08: 2 [IU] via SUBCUTANEOUS
  Administered 2019-05-08 (×2): 4 [IU] via SUBCUTANEOUS

## 2019-03-18 MED ORDER — GUAIFENESIN ER 600 MG PO TB12
600.0000 mg | ORAL_TABLET | Freq: Two times a day (BID) | ORAL | Status: DC
  Administered 2019-03-18 – 2019-03-19 (×3): 600 mg via ORAL
  Filled 2019-03-18 (×4): qty 1

## 2019-03-18 MED ORDER — AMIODARONE LOAD VIA INFUSION
150.0000 mg | Freq: Once | INTRAVENOUS | Status: AC
  Administered 2019-03-18: 150 mg via INTRAVENOUS
  Filled 2019-03-18: qty 83.34

## 2019-03-18 MED ORDER — ORAL CARE MOUTH RINSE
15.0000 mL | Freq: Two times a day (BID) | OROMUCOSAL | Status: DC
  Administered 2019-03-19 – 2019-03-21 (×4): 15 mL via OROMUCOSAL

## 2019-03-18 MED ORDER — AMIODARONE HCL IN DEXTROSE 360-4.14 MG/200ML-% IV SOLN
60.0000 mg/h | INTRAVENOUS | Status: AC
  Administered 2019-03-18 (×2): 60 mg/h via INTRAVENOUS
  Filled 2019-03-18: qty 200

## 2019-03-18 MED ORDER — MORPHINE SULFATE (PF) 2 MG/ML IV SOLN
1.0000 mg | INTRAVENOUS | Status: DC | PRN
  Administered 2019-03-18: 1 mg via INTRAVENOUS
  Filled 2019-03-18: qty 1

## 2019-03-18 MED ORDER — AMIODARONE HCL IN DEXTROSE 360-4.14 MG/200ML-% IV SOLN
30.0000 mg/h | INTRAVENOUS | Status: DC
  Administered 2019-03-18 – 2019-03-19 (×3): 60 mg/h via INTRAVENOUS
  Administered 2019-03-19 (×2): 30 mg/h via INTRAVENOUS
  Administered 2019-03-19: 04:00:00 60 mg/h via INTRAVENOUS
  Administered 2019-03-20 – 2019-03-25 (×10): 30 mg/h via INTRAVENOUS
  Filled 2019-03-18 (×11): qty 200
  Filled 2019-03-18: qty 400
  Filled 2019-03-18 (×4): qty 200

## 2019-03-18 MED ORDER — ENOXAPARIN SODIUM 30 MG/0.3ML ~~LOC~~ SOLN
30.0000 mg | SUBCUTANEOUS | Status: DC
  Administered 2019-03-18 – 2019-03-21 (×4): 30 mg via SUBCUTANEOUS
  Filled 2019-03-18 (×4): qty 0.3

## 2019-03-18 MED ORDER — FUROSEMIDE 10 MG/ML IJ SOLN
40.0000 mg | Freq: Once | INTRAMUSCULAR | Status: AC
  Administered 2019-03-18: 40 mg via INTRAVENOUS
  Filled 2019-03-18: qty 4

## 2019-03-18 NOTE — Progress Notes (Signed)
[address] CitySuite [address]       [place] [address]             [phone]                 2 Days Post-Op Procedure(s) (LRB): CORONARY ARTERY BYPASS GRAFTING (CABG), ON PUMP, TIMES THREE, USING LEFT INTERNAL MAMMARY ARTERY, RIGHT GREATER SAPHENOUS VEIN HARVESTED ENDOSCOPICALLY (N/A) MAZE (N/A) CLIPPING OF LEFT  ATRIAL APPENDAGE - USING ATRICLIP SIZE 40 (N/A) TRANSESOPHAGEAL ECHOCARDIOGRAM (TEE) (N/A)   Events: afib overnight On amio  _______________________________________________________________ Vitals: BP (!) 100/58   Pulse (!) 139   Temp 98.8 F (37.1 C)   Resp (!) 28   Ht 5\' 8"  (1.727 m)   Wt 95.6 kg   SpO2 91%   BMI 32.05 kg/m   - Neuro:alert NAd  - Cardiovascular: sinus  Drips: milr 0.25,  PAP: (19-56)/(6-26) 38/24 CVP:  [4 mmHg-33 mmHg] 14 mmHg CO:  [4 L/min-5 L/min] 5 L/min CI:  [2 L/min/m2-2.5 L/min/m2] 2.5 L/min/m2  - Pulm: coarse B Vent Mode: CPAP;PSV FiO2 (%):  [40 %] 40 % Set Rate:  [4 bmp] 4 bmp PEEP:  [5 cmH20] 5 cmH20 Pressure Support:  [10 cmH20] 10 cmH20  ABG    Component Value Date/Time   PHART 7.327 (L) 03/17/2019 0348   PCO2ART 44.3 03/17/2019 0348   PO2ART 89.0 03/17/2019 0348   HCO3 22.8 03/17/2019 0348   TCO2 24 03/17/2019 0348   ACIDBASEDEF 3.0 (H) 03/17/2019 0348   O2SAT 47.3 03/18/2019 0515    - Abd: soft - Extremity: trace edema  .Intake/Output      01/30 0701 - 01/31 0700 01/31 0701 - 02/01 0700   P.O. 420    I.V. (mL/kg) 848.7 (8.9)    Blood 315    Other     IV Piggyback 271.7    Total Intake(mL/kg) 1855.4 (19.4)    Urine (mL/kg/hr) 515 (0.2)    Emesis/NG output 0    Blood     Chest Tube 180    Total Output 695    Net +1160.4            _______________________________________________________________ Labs: CBC Latest Ref Rng & Units 03/18/2019 03/17/2019 03/17/2019  WBC 4.0 - 10.5 K/uL 18.0(H) 17.8(H) -  Hemoglobin 13.0 - 17.0 g/dL 8.4(L) 8.6(L) 8.8(L)  Hematocrit 39.0 - 52.0 % 26.4(L) 26.7(L)  26.0(L)  Platelets 150 - 400 K/uL 124(L) 123(L) -   CMP Latest Ref Rng & Units 03/18/2019 03/17/2019 03/17/2019  Glucose 70 - 99 mg/dL 107(H) 129(H) -  BUN 8 - 23 mg/dL 24(H) 19 -  Creatinine 0.61 - 1.24 mg/dL 1.44(H) 1.43(H) -  Sodium 135 - 145 mmol/L 136 137 137  Potassium 3.5 - 5.1 mmol/L 4.1 4.6 4.6  Chloride 98 - 111 mmol/L 103 103 -  CO2 22 - 32 mmol/L 24 24 -  Calcium 8.9 - 10.3 mg/dL 8.2(L) 8.2(L) -  Total Protein 6.5 - 8.1 g/dL - - -  Total Bilirubin 0.3 - 1.2 mg/dL - - -  Alkaline Phos 38 - 126 U/L - - -  AST 15 - 41 U/L - - -  ALT 0 - 44 U/L - - -    CXR: None today  _______________________________________________________________  Assessment and Plan: POD 2 s/p CABG  Neuro: pain controlled CV: decreased milr to 0.125 Pulm: pulm toilet.  Needs to clear secretions Renal:creat stable.  Gentle diuresis today   GI: advancing diet Heme: stable ID: [email or web address]  Endo: SSI   Dispo: continue ICU care  Melodie Bouillon, MD 03/18/2019 10:02 AM

## 2019-03-18 NOTE — Progress Notes (Signed)
Progress Note  Patient Name: BRESLIN HEMANN Date of Encounter: 03/18/2019  Primary Cardiologist: Kate Sable, MD   Subjective   Pt extubated    Chest is sore   Breathing fair   Inpatient Medications    Scheduled Meds: . acetaminophen  1,000 mg Oral Q6H   Or  . acetaminophen (TYLENOL) oral liquid 160 mg/5 mL  1,000 mg Per Tube Q6H  . aspirin EC  325 mg Oral Daily   Or  . aspirin  324 mg Per Tube Daily  . atorvastatin  80 mg Oral q1800  . bisacodyl  10 mg Oral Daily   Or  . bisacodyl  10 mg Rectal Daily  . Chlorhexidine Gluconate Cloth  6 each Topical Daily  . docusate sodium  200 mg Oral Daily  . [START ON 03/19/2019] mouth rinse  15 mL Mouth Rinse BID  . metoCLOPramide (REGLAN) injection  10 mg Intravenous Q6H  . metoprolol tartrate  12.5 mg Oral BID   Or  . metoprolol tartrate  12.5 mg Per Tube BID  . pantoprazole  40 mg Oral Daily  . sodium chloride flush  10-40 mL Intracatheter Q12H  . sodium chloride flush  3 mL Intravenous Q12H   Continuous Infusions: . sodium chloride    . sodium chloride    . sodium chloride 10 mL/hr (03/16/19 1710)  . amiodarone Stopped (03/16/19 1711)  . amiodarone    . dexmedetomidine (PRECEDEX) IV infusion Stopped (03/17/19 0973)  . EPINEPHrine 4 mg in dextrose 5% 250 mL infusion (16 mcg/mL) Stopped (03/17/19 1216)  . insulin Stopped (03/18/19 5329)  . lactated ringers    . lactated ringers    . lactated ringers 10 mL/hr at 03/18/19 0700  . milrinone 0.25 mcg/kg/min (03/18/19 0700)  . norepinephrine (LEVOPHED) Adult infusion Stopped (03/17/19 1839)   PRN Meds: sodium chloride, dextrose, lactated ringers, metoprolol tartrate, midazolam, morphine injection, ondansetron (ZOFRAN) IV, oxyCODONE, sodium chloride flush, sodium chloride flush, traMADol   Vital Signs    Vitals:   03/18/19 0600 03/18/19 0700 03/18/19 0800 03/18/19 0918  BP: 110/85 91/73 132/66 (!) 100/58  Pulse: (!) 115 (!) 130 (!) 128 (!) 139  Resp: (!) 24 (!) 27 (!)  28   Temp: 99.1 F (37.3 C) 98.8 F (37.1 C) 98.8 F (37.1 C)   TempSrc:      SpO2: 92% (!) 88% 91%   Weight:      Height:        Intake/Output Summary (Last 24 hours) at 03/18/2019 1000 Last data filed at 03/18/2019 0700 Gross per 24 hour  Intake 1654.1 ml  Output 555 ml  Net 1099.1 ml   Last 3 Weights 03/18/2019 03/17/2019 03/16/2019  Weight (lbs) 210 lb 12.2 oz 211 lb 13.8 oz 193 lb 4.8 oz  Weight (kg) 95.6 kg 96.1 kg 87.68 kg      Telemetry    Afib with RVR  Along with short episodes of SR  - Personally Reviewed  ECG    Afib with RVR 156 bpm   ANteroseptal MI  Nonspecific IVCD ST depression laterally, consider ischemia  Improved from previous EKG s   - Personally Reviewed  Physical Exam   GEN: No acute distress.   Neck: JVP is increase  Cardiac: Irreg irreg    Respiratory: Rhonchi bilaterally  GI: Soft, nontender, non-distended  MS: Tr to 1+ edema hands and LE   No deformity.  Warm   Neuro:  Nonfocal  Psych: Normal affect   Labs  High Sensitivity Troponin:   Recent Labs  Lab 03/13/19 1056 03/13/19 1251 03/13/19 2257 03/14/19 0402  TROPONINIHS 32* 440* 926* 585*      Chemistry Recent Labs  Lab 03/15/19 1453 03/16/19 0444 03/17/19 0317 03/17/19 0317 03/17/19 0348 03/17/19 1621 03/18/19 0534  NA 136   < > 135   < > 137 137 136  K 4.1   < > 4.6   < > 4.6 4.6 4.1  CL 100   < > 103  --   --  103 103  CO2 26   < > 24  --   --  24 24  GLUCOSE 144*   < > 163*  --   --  129* 107*  BUN 15   < > 13  --   --  19 24*  CREATININE 1.02   < > 1.03  --   --  1.43* 1.44*  CALCIUM 8.7*   < > 8.1*  --   --  8.2* 8.2*  PROT 6.3*  --   --   --   --   --   --   ALBUMIN 3.3*  --   --   --   --   --   --   AST 18  --   --   --   --   --   --   ALT 10  --   --   --   --   --   --   ALKPHOS 56  --   --   --   --   --   --   BILITOT 0.7  --   --   --   --   --   --   GFRNONAA >60   < > >60  --   --  43* 43*  GFRAA >60   < > >60  --   --  50* 50*  ANIONGAP 10   < >  8  --   --  10 9   < > = values in this interval not displayed.     Hematology Recent Labs  Lab 03/17/19 0317 03/17/19 0317 03/17/19 0348 03/17/19 1621 03/18/19 0534  WBC 14.6*  --   --  17.8* 18.0*  RBC 2.66*  --   --  2.87* 2.86*  HGB 7.7*   < > 8.8* 8.6* 8.4*  HCT 25.2*   < > 26.0* 26.7* 26.4*  MCV 94.7  --   --  93.0 92.3  MCH 28.9  --   --  30.0 29.4  MCHC 30.6  --   --  32.2 31.8  RDW 14.5  --   --  14.6 14.7  PLT 133*  --   --  123* 124*   < > = values in this interval not displayed.    BNPNo results for input(s): BNP, PROBNP in the last 168 hours.   DDimer No results for input(s): DDIMER in the last 168 hours.   Radiology    DG Chest Port 1 View  Result Date: 03/17/2019 CLINICAL DATA:  Post CABG x3. EXAM: PORTABLE CHEST 1 VIEW COMPARISON:  03/16/2019 FINDINGS: Endotracheal tube, mediastinal drain and right IJ Swan-Ganz catheter unchanged. Bilateral chest tubes unchanged. Nasogastric tube courses into the region of the stomach and off the film as tip is not visualized. Lungs are adequately inflated with persistent left basilar opacification likely small pleural effusion and atelectasis. Suggestion of mild vascular congestion unchanged. Cardiomediastinal silhouette and remainder  of the exam is unchanged. IMPRESSION: 1. Stable left base opacification likely small effusion with atelectasis. Stable mild vascular congestion. 2.  Tubes and lines unchanged. Electronically Signed   By: Marin Olp M.D.   On: 03/17/2019 08:05   DG Chest Port 1 View  Result Date: 03/16/2019 CLINICAL DATA:  Post CABG x3. EXAM: PORTABLE CHEST 1 VIEW COMPARISON:  03/15/2019 FINDINGS: Sternotomy wires are present. Endotracheal tube has tip 5.1 cm above the carina. Enteric tube courses into the stomach and off the film as tip is not visualized. Right IJ Swan-Ganz catheter has tip over the main pulmonary artery segment. Mediastinal drain and bilateral chest tubes are in adequate position. Lungs are  adequately inflated with minimal left basilar opacification likely atelectasis and possible small amount left pleural fluid. Minimal prominence of the perihilar markings likely mild vascular congestion. No evidence of pneumothorax. Cardiomediastinal silhouette and remainder of the exam is unchanged. IMPRESSION: 1. Mild left base opacification likely atelectasis and possible small amount of pleural fluid. Evidence of mild vascular congestion. 1. Tubes and lines as described. Electronically Signed   By: Marin Olp M.D.   On: 03/16/2019 18:45   ECHO INTRAOPERATIVE TEE  Result Date: 03/16/2019  *INTRAOPERATIVE TRANSESOPHAGEAL REPORT *  Patient Name:   TESEAN STUMP   Date of Exam: 03/16/2019 Medical Rec #:  [id number]      Height:       68.0 in Accession #:    [id number]     Weight:       193.3 lb Date of Birth:  Jun 26, 1928     BSA:          2.01 m Patient Age:    40 years       BP:           103/68 mmHg Patient Gender: M              HR:           103 bpm. Exam Location:  Anesthesiology Transesophogeal exam was perform intraoperatively during surgical procedure. Patient was closely monitored under general anesthesia during the entirety of examination. Indications:     CAD Performing Phys: [place] TRIGT Diagnosing Phys: Nolon Nations MD Complications: No known complications during this procedure. POST-OP IMPRESSIONS - Left Ventricle: has mildly reduced systolic function, with an ejection fraction of 45%. The cavity size was mildly dilated. - Aorta: The aorta appears unchanged from pre-bypass. - Left Atrial Appendage: The left atrial appendage appears unchanged from pre-bypass. - Aortic Valve: The aortic valve appears unchanged from pre-bypass. - Mitral Valve: The mitral valve appears unchanged from pre-bypass. - Tricuspid Valve: The tricuspid valve appears unchanged from pre-bypass. PRE-OP FINDINGS  Left Ventricle: The left ventricle has moderate-severely reduced systolic function, with an ejection fraction  of 30-35%. The cavity size was mildly dilated. There is no increase in left ventricular wall thickness. Right Ventricle: The right ventricle has mildly reduced systolic function. The cavity was mildly enlarged. There is no increase in right ventricular wall thickness. Left Atrium: Left atrial size was dilated. The left atrial appendage is well visualized and there is no evidence of thrombus present. Right Atrium: Right atrial size was normal in size. Interatrial Septum: No atrial level shunt detected by color flow Doppler. Pericardium: There is no evidence of pericardial effusion. Mitral Valve: The mitral valve is normal in structure. Mild thickening of the mitral valve leaflet. Mitral valve regurgitation is mild by color flow Doppler. The MR jet is centrally-directed. Tricuspid  Valve: The tricuspid valve was normal in structure. Tricuspid valve regurgitation is trivial by color flow Doppler. Aortic Valve: The aortic valve is tricuspid Aortic valve regurgitation was not visualized by color flow Doppler. There is no evidence of aortic valve stenosis. Pulmonic Valve: The pulmonic valve was normal in structure. Pulmonic valve regurgitation is trivial by color flow Doppler. +-------------+-----------++ AORTIC VALVE             +-------------+-----------++ AV Vmax:     129.50 cm/s +-------------+-----------++ AV Vmean:    90.150 cm/s +-------------+-----------++ AV VTI:      0.284 m     +-------------+-----------++ AV Peak Grad:6.7 mmHg    +-------------+-----------++ AV Mean Grad:4.0 mmHg    +-------------+-----------++  Nolon Nations MD Electronically signed by Nolon Nations MD Signature Date/Time: 03/16/2019/4:55:09 PM    Final     Cardiac Studies   Cath 03/15/19   Dist LM to Ost LAD lesion is 80% stenosed.  Ost LM to Dist LM lesion is 50% stenosed.  Mid Cx lesion is 30% stenosed.  Prox RCA-1 lesion is 60% stenosed.  Prox RCA-2 lesion is 99% stenosed.   1. Severe calcific  stenosis in the distal left main, ostial LAD and mid RCA 2. Elevated filling pressures  Echocardiogram: 03/13/2019 IMPRESSIONS   1. Left ventricular ejection fraction, by visual estimation, is 30 to 35%. The left ventricle has moderate to severely decreased function. There is mildly increased left ventricular hypertrophy. 2. Left ventricular diastolic parameters are indeterminate in the setting of atrial fibrillation. 3. The left ventricle demonstrates global hypokinesis with paradoxical septal motion. 4. Global right ventricle has mildly reduced systolic function.The right ventricular size is normal. No increase in right ventricular wall thickness. 5. Left atrial size was upper normal. 6. Right atrial size was normal. 7. Mild mitral annular calcification. 8. The mitral valve is grossly normal. Trivial mitral valve regurgitation. 9. The tricuspid valve is grossly normal. Tricuspid valve regurgitation is trivial. 10. The aortic valve is tricuspid. Aortic valve regurgitation is not visualized. Mild aortic valve sclerosis without stenosis. 11. The pulmonic valve was grossly normal. Pulmonic valve regurgitation is trivial. 12. TR signal is inadequate for assessing pulmonary artery systolic pressure. 13. The inferior vena cava is dilated in size with <50% respiratory variability, suggesting right atrial pressure of 15 mmHg.    Patient Profile     DARRYLL RAJU is a 86 y.o. male with past medical history of CAD (s/p PCI in 1992, significant CAD by Coronary CT in 01/2018 --> catheterization not pursued at that time given no recent symptoms), chronic diastolic CHF, paroxysmal atrial fibrillation, HTN, HLD, follicular lymphoma and history of prostate cancer who is being seen today for the evaluation of atrial fibrillation with RVR at the request of Dr. Denton Brick.   Assessment & Plan    1  CAD Pt is now severe CAD   He is POD 2 from CABG x3 (LIMA to LAD; SVG to OM1; SVG to PDA)  Alos MAZE  and LA clipping.     REmains on milrinone    2 Atrial fibrillation   Pt is s/p MAZE and LAA clipping   REcurrent afib with RVR   On 60 mg /hour amio IV    He had transient break this AM   I would continue on current rate for now and follow Looks like he may break soon  If doesn't in next couple hours would rebolus Watch BP      3  HL  On 80  lipitor    4.  Anemia  REceived pRBC   Hgb 8.4 today    For questions or updates, please contact [place] Please consult [email or web address] for contact info under        Signed, Dorris Carnes, MD  03/18/2019, 10:00 AM

## 2019-03-19 ENCOUNTER — Inpatient Hospital Stay: Payer: Self-pay

## 2019-03-19 ENCOUNTER — Inpatient Hospital Stay (HOSPITAL_COMMUNITY): Payer: Medicare Other

## 2019-03-19 ENCOUNTER — Encounter: Payer: Self-pay | Admitting: *Deleted

## 2019-03-19 LAB — TYPE AND SCREEN
ABO/RH(D): B POS
Antibody Screen: NEGATIVE
Unit division: 0
Unit division: 0
Unit division: 0
Unit division: 0

## 2019-03-19 LAB — BPAM RBC
Blood Product Expiration Date: 202102242359
Blood Product Expiration Date: 202102262359
Blood Product Expiration Date: 202102272359
Blood Product Expiration Date: 202102282359
ISSUE DATE / TIME: 202101291216
ISSUE DATE / TIME: 202101300903
Unit Type and Rh: 7300
Unit Type and Rh: 7300
Unit Type and Rh: 7300
Unit Type and Rh: 7300

## 2019-03-19 LAB — CBC
HCT: 25.9 % — ABNORMAL LOW (ref 39.0–52.0)
Hemoglobin: 8.1 g/dL — ABNORMAL LOW (ref 13.0–17.0)
MCH: 29.3 pg (ref 26.0–34.0)
MCHC: 31.3 g/dL (ref 30.0–36.0)
MCV: 93.8 fL (ref 80.0–100.0)
Platelets: 168 10*3/uL (ref 150–400)
RBC: 2.76 MIL/uL — ABNORMAL LOW (ref 4.22–5.81)
RDW: 14.9 % (ref 11.5–15.5)
WBC: 18.7 10*3/uL — ABNORMAL HIGH (ref 4.0–10.5)
nRBC: 0 % (ref 0.0–0.2)

## 2019-03-19 LAB — BASIC METABOLIC PANEL
Anion gap: 8 (ref 5–15)
Anion gap: 13 (ref 5–15)
BUN: 34 mg/dL — ABNORMAL HIGH (ref 8–23)
BUN: 43 mg/dL — ABNORMAL HIGH (ref 8–23)
CO2: 24 mmol/L (ref 22–32)
CO2: 22 mmol/L (ref 22–32)
Calcium: 8.1 mg/dL — ABNORMAL LOW (ref 8.9–10.3)
Calcium: 7.9 mg/dL — ABNORMAL LOW (ref 8.9–10.3)
Chloride: 100 mmol/L (ref 98–111)
Chloride: 98 mmol/L (ref 98–111)
Creatinine, Ser: 1.88 mg/dL — ABNORMAL HIGH (ref 0.61–1.24)
Creatinine, Ser: 2.09 mg/dL — ABNORMAL HIGH (ref 0.61–1.24)
GFR calc Af Amer: 36 mL/min — ABNORMAL LOW (ref >60)
GFR calc Af Amer: 32 mL/min — ABNORMAL LOW (ref >60)
GFR calc non Af Amer: 31 mL/min — ABNORMAL LOW (ref >60)
GFR calc non Af Amer: 27 mL/min — ABNORMAL LOW (ref >60)
Glucose, Bld: 145 mg/dL — ABNORMAL HIGH (ref 70–99)
Glucose, Bld: 149 mg/dL — ABNORMAL HIGH (ref 70–99)
Potassium: 4.4 mmol/L (ref 3.5–5.1)
Potassium: 4.1 mmol/L (ref 3.5–5.1)
Sodium: 132 mmol/L — ABNORMAL LOW (ref 135–145)
Sodium: 133 mmol/L — ABNORMAL LOW (ref 135–145)

## 2019-03-19 LAB — COOXEMETRY PANEL
Carboxyhemoglobin: 1 % (ref 0.5–1.5)
Methemoglobin: 1.1 % (ref 0.0–1.5)
O2 Saturation: 52.4 %
Total hemoglobin: 9.2 g/dL — ABNORMAL LOW (ref 12.0–16.0)

## 2019-03-19 LAB — POCT I-STAT 7, (LYTES, BLD GAS, ICA,H+H)
Acid-base deficit: 2 mmol/L (ref 0.0–2.0)
Bicarbonate: 23.9 mmol/L (ref 20.0–28.0)
Calcium, Ion: 1.12 mmol/L — ABNORMAL LOW (ref 1.15–1.40)
HCT: 25 % — ABNORMAL LOW (ref 39.0–52.0)
Hemoglobin: 8.5 g/dL — ABNORMAL LOW (ref 13.0–17.0)
O2 Saturation: 86 %
Potassium: 4.1 mmol/L (ref 3.5–5.1)
Sodium: 132 mmol/L — ABNORMAL LOW (ref 135–145)
TCO2: 25 mmol/L (ref 22–32)
pCO2 arterial: 43.1 mmHg (ref 32.0–48.0)
pH, Arterial: 7.352 (ref 7.350–7.450)
pO2, Arterial: 55 mmHg — ABNORMAL LOW (ref 83.0–108.0)

## 2019-03-19 LAB — GLUCOSE, CAPILLARY
Glucose-Capillary: 123 mg/dL — ABNORMAL HIGH (ref 70–99)
Glucose-Capillary: 127 mg/dL — ABNORMAL HIGH (ref 70–99)
Glucose-Capillary: 128 mg/dL — ABNORMAL HIGH (ref 70–99)
Glucose-Capillary: 130 mg/dL — ABNORMAL HIGH (ref 70–99)
Glucose-Capillary: 136 mg/dL — ABNORMAL HIGH (ref 70–99)
Glucose-Capillary: 141 mg/dL — ABNORMAL HIGH (ref 70–99)
Glucose-Capillary: 145 mg/dL — ABNORMAL HIGH (ref 70–99)

## 2019-03-19 MED ORDER — LEVALBUTEROL HCL 1.25 MG/0.5ML IN NEBU
1.2500 mg | INHALATION_SOLUTION | Freq: Four times a day (QID) | RESPIRATORY_TRACT | Status: DC
  Administered 2019-03-19 – 2019-03-24 (×21): 1.25 mg via RESPIRATORY_TRACT
  Filled 2019-03-19 (×21): qty 0.5

## 2019-03-19 MED ORDER — METOCLOPRAMIDE HCL 5 MG/ML IJ SOLN
10.0000 mg | Freq: Four times a day (QID) | INTRAMUSCULAR | Status: DC
  Administered 2019-03-19 – 2019-03-23 (×13): 10 mg via INTRAVENOUS
  Filled 2019-03-19 (×14): qty 2

## 2019-03-19 MED ORDER — AMIODARONE LOAD VIA INFUSION
150.0000 mg | Freq: Once | INTRAVENOUS | Status: AC
  Administered 2019-03-19: 150 mg via INTRAVENOUS

## 2019-03-19 MED ORDER — FUROSEMIDE 10 MG/ML IJ SOLN
80.0000 mg | Freq: Once | INTRAMUSCULAR | Status: AC
  Administered 2019-03-19: 80 mg via INTRAVENOUS
  Filled 2019-03-19: qty 8

## 2019-03-19 MED ORDER — FUROSEMIDE 10 MG/ML IJ SOLN
80.0000 mg | Freq: Two times a day (BID) | INTRAMUSCULAR | Status: DC
  Administered 2019-03-19 – 2019-03-20 (×2): 80 mg via INTRAVENOUS
  Filled 2019-03-19 (×2): qty 8

## 2019-03-19 MED ORDER — AMIODARONE LOAD VIA INFUSION
150.0000 mg | Freq: Once | INTRAVENOUS | Status: AC
  Administered 2019-03-19: 150 mg via INTRAVENOUS
  Filled 2019-03-19: qty 83.34

## 2019-03-19 MED ORDER — SODIUM CHLORIDE 0.9 % IV SOLN
1.0000 g | Freq: Every day | INTRAVENOUS | Status: AC
  Administered 2019-03-19 – 2019-03-25 (×7): 1 g via INTRAVENOUS
  Filled 2019-03-19 (×7): qty 1

## 2019-03-19 NOTE — Progress Notes (Signed)
Patient ID: Jesse Murphy, male   DOB: [birth year], 86 y.o.   MRN: [id number] EVENING ROUNDS NOTE :     [address]       [place] [address]             [phone]                 3 Days Post-Op Procedure(s) (LRB): CORONARY ARTERY BYPASS GRAFTING (CABG), ON PUMP, TIMES THREE, USING LEFT INTERNAL MAMMARY ARTERY, RIGHT GREATER SAPHENOUS VEIN HARVESTED ENDOSCOPICALLY (N/A) MAZE (N/A) CLIPPING OF LEFT  ATRIAL APPENDAGE - USING ATRICLIP SIZE 40 (N/A) TRANSESOPHAGEAL ECHOCARDIOGRAM (TEE) (N/A)  Total Length of Stay:  LOS: 5 days  BP (!) 111/57   Pulse (!) 123   Temp 98.5 F (36.9 C) (Oral)   Resp (!) 31   Ht 5\' 8"  (1.727 m)   Wt 95.9 kg   SpO2 92%   BMI 32.15 kg/m   .Intake/Output      02/01 0701 - 02/02 0700   I.V. (mL/kg) 590.4 (6.2)   IV Piggyback 99.9   Total Intake(mL/kg) 690.3 (7.2)   Urine (mL/kg/hr) 900 (0.8)   Chest Tube    Total Output 900   Net -209.7       Urine Occurrence 1 x     . sodium chloride    . sodium chloride    . sodium chloride 10 mL/hr (03/16/19 1710)  . amiodarone 30 mg/hr (03/19/19 1823)  . cefTRIAXone (ROCEPHIN)  IV Stopped (03/19/19 1001)  . lactated ringers 10 mL/hr at 03/19/19 1700  . milrinone 0.25 mcg/kg/min (03/19/19 1824)  . norepinephrine (LEVOPHED) Adult infusion 5 mcg/min (03/19/19 1700)     Lab Results  Component Value Date   WBC 18.7 (H) 03/19/2019   HGB 8.5 (L) 03/19/2019   HCT 25.0 (L) 03/19/2019   PLT 168 03/19/2019   GLUCOSE 149 (H) 03/19/2019   CHOL 117 03/16/2019   TRIG 52 03/16/2019   HDL 33 (L) 03/16/2019   LDLCALC 74 03/16/2019   ALT 10 03/15/2019   AST 18 03/15/2019   NA 133 (L) 03/19/2019   K 4.1 03/19/2019   CL 98 03/19/2019   CREATININE 2.09 (H) 03/19/2019   BUN 43 (H) 03/19/2019   CO2 22 03/19/2019   TSH 3.683 03/13/2019   INR 1.6 (H) 03/16/2019   HGBA1C 6.1 (H) 03/13/2019   Cr 2.09 On bipap Levaphed, milrinone and Cordarone afib 120  CCM seeing for respiratory status      Grace Isaac MD  Beeper [phone] Office [phone] 03/19/2019 7:05 PM

## 2019-03-19 NOTE — Progress Notes (Signed)
Daily Progress Note  Patient Details:    Jesse Murphy is an 86 y.o. male with a h/o CAD, afib, now POD 3 from CABG and clipping of left atrial appendage p/w shortness of breath.   PMHx: CAD, afib  Significant Hospital Events: 1/29 CABG + clipping left atrial appendage 2/1 transfer to CVICU 2/2 SOB  Significant Diagnostic Tests:  2/1 CXR shows likely pulm edema 1/29 Intraoperative Echo, EF of 45% 1/26 Preop Echo, EF of 30-35%  Micro Data: 2/1 sputum sent 1/26 MRSA PCR screening negative 1/26 Covid negative  Antimicrobials: Ceftriaxone 2/1 -   Interim Hx/ Subjective: Patient is somnolent and weak, and says breathing has gotten a little worse over the last few days.  He is currently on 15L high flow nasal cannula and O2 sat is 94-96.  Nurse is suctioning purulent material, doing chest physiotherapy, and administering xopenex q6 hours.  Lines, Airways, Drains: Introducer Double Lumen 03/16/19 Internal Jugular Right (Active)  Site Assessment Clean;Dry;Intact 03/19/19 0800  Proximal Lumen Status Capped 03/19/19 0800  Distal Lumen Status Infusing 03/19/19 0800  Dressing Type Transparent;Occlusive 03/19/19 0800  Dressing Status Clean;Dry;Intact;Antimicrobial disc in place 03/19/19 0800  Dressing Interventions Dressing changed 03/19/19 0800  Dressing Change Due 03/26/19 03/19/19 0800     External Urinary Catheter (Active)  Collection Container Standard drainage bag 03/19/19 0800  Securement Method Securing device (Describe) 03/19/19 0800  Site Assessment Clean;Intact;Dry 03/19/19 0800  Output (mL) 0 mL 03/19/19 1500    Anti-infectives:  Anti-infectives (From admission, onward)   Start     Dose/Rate Route Frequency Ordered Stop   03/19/19 1000  cefTRIAXone (ROCEPHIN) 1 g in sodium chloride 0.9 % 100 mL IVPB     1 g 200 mL/hr over 30 Minutes Intravenous Daily 03/19/19 0810     03/16/19 2230  vancomycin (VANCOCIN) IVPB 1000 mg/200 mL premix     1,000 mg 200 mL/hr over 60  Minutes Intravenous  Once 03/16/19 1721 03/17/19 0024   03/16/19 1830  cefUROXime (ZINACEF) 1.5 g in sodium chloride 0.9 % 100 mL IVPB     1.5 g 200 mL/hr over 30 Minutes Intravenous Every 12 hours 03/16/19 1721 03/18/19 0557   03/16/19 0400  vancomycin (VANCOREADY) IVPB 1500 mg/300 mL  Status:  Discontinued     1,500 mg 150 mL/hr over 120 Minutes Intravenous To Surgery 03/15/19 1425 03/16/19 1715   03/16/19 0400  cefUROXime (ZINACEF) 1.5 g in sodium chloride 0.9 % 100 mL IVPB  Status:  Discontinued     1.5 g 200 mL/hr over 30 Minutes Intravenous To Surgery 03/15/19 1425 03/16/19 1715   03/16/19 0400  cefUROXime (ZINACEF) 750 mg in sodium chloride 0.9 % 100 mL IVPB     750 mg 200 mL/hr over 30 Minutes Intravenous To Surgery 03/15/19 1425 03/16/19 1540      Microbiology: Results for orders placed or performed during the hospital encounter of 03/13/19  Respiratory Panel by RT PCR (Flu A&B, Covid) - Nasopharyngeal Swab     Status: None   Collection Time: 03/13/19  1:49 PM   Specimen: Nasopharyngeal Swab  Result Value Ref Range Status   SARS Coronavirus 2 by RT PCR NEGATIVE NEGATIVE Final    Comment: (NOTE) SARS-CoV-2 target nucleic acids are NOT DETECTED. The SARS-CoV-2 RNA is generally detectable in upper respiratoy specimens during the acute phase of infection. The lowest concentration of SARS-CoV-2 viral copies this assay can detect is 131 copies/mL. A negative result does not preclude SARS-Cov-2 infection and should not  be used as the sole basis for treatment or other patient management decisions. A negative result may occur with  improper specimen collection/handling, submission of specimen other than nasopharyngeal swab, presence of viral mutation(s) within the areas targeted by this assay, and inadequate number of viral copies (<131 copies/mL). A negative result must be combined with clinical observations, patient history, and epidemiological information. The expected result  is Negative. Fact Sheet for Patients:  PinkCheek.be Fact Sheet for Healthcare Providers:  GravelBags.it This test is not yet ap proved or cleared by the [place] FDA and  has been authorized for detection and/or diagnosis of SARS-CoV-2 by FDA under an Emergency Use Authorization (EUA). This EUA will remain  in effect (meaning this test can be used) for the duration of the COVID-19 declaration under Section 564(b)(1) of the Act, 21 U.S.C. section 360bbb-3(b)(1), unless the authorization is terminated or revoked sooner.    Influenza A by PCR NEGATIVE NEGATIVE Final   Influenza B by PCR NEGATIVE NEGATIVE Final    Comment: (NOTE) The Xpert Xpress SARS-CoV-2/FLU/RSV assay is intended as an aid in  the diagnosis of influenza from Nasopharyngeal swab specimens and  should not be used as a sole basis for treatment. Nasal washings and  aspirates are unacceptable for Xpert Xpress SARS-CoV-2/FLU/RSV  testing. Fact Sheet for Patients: PinkCheek.be Fact Sheet for Healthcare Providers: GravelBags.it This test is not yet approved or cleared by the [place] FDA and  has been authorized for detection and/or diagnosis of SARS-CoV-2 by  FDA under an Emergency Use Authorization (EUA). This EUA will remain  in effect (meaning this test can be used) for the duration of the  Covid-19 declaration under Section 564(b)(1) of the Act, 21  U.S.C. section 360bbb-3(b)(1), unless the authorization is  terminated or revoked. Performed at [facility], [address], [place], [place] [address]   MRSA PCR Screening     Status: None   Collection Time: 03/13/19  5:18 PM   Specimen: Nasal Mucosa; Nasopharyngeal  Result Value Ref Range Status   MRSA by PCR NEGATIVE NEGATIVE Final    Comment:        The GeneXpert MRSA Assay (FDA approved for NASAL specimens only), is one component of a  comprehensive MRSA colonization surveillance program. It is not intended to diagnose MRSA infection nor to guide or monitor treatment for MRSA infections. Performed at [facility], [address], [place], [place] [address]   Surgical pcr screen     Status: None   Collection Time: 03/15/19  9:28 PM   Specimen: Nasal Mucosa; Nasal Swab  Result Value Ref Range Status   MRSA, PCR NEGATIVE NEGATIVE Final   Staphylococcus aureus NEGATIVE NEGATIVE Final    Comment: (NOTE) The Xpert SA Assay (FDA approved for NASAL specimens in patients 7 years of age and older), is one component of a comprehensive surveillance program. It is not intended to diagnose infection nor to guide or monitor treatment. Performed at [facility], [place] [address] [place], [place] [address]     Best Practice/Protocols:  VTE Prophylaxis: Lovenox (30mg  subcutaneous q24 hours)   Consults: Treatment Team:  Satira Sark, MD Prescott Gum, Collier Salina, MD   Subjective:    Overnight Issues:   Objective:  Vital signs for last 24 hours: Temp:  [97.5 F (36.4 C)-98.5 F (36.9 C)] 98.5 F (36.9 C) (02/01 1544) Pulse Rate:  [33-130] 107 (02/01 1503) BP: (89-150)/(49-117) 96/60 (02/01 1503) SpO2:  [68 %-100 %] 93 % (02/01 1503)  Weight:  [95.9 kg] 95.9 kg (02/01 0500)  Hemodynamic parameters for last 24 hours:    Intake/Output from previous day: 01/31 0701 - 02/01 0700 In: 1416.9 [I.V.:1416.9] Out: 305 [Urine:215; Chest Tube:90]  Intake/Output this shift: Total I/O In: 507.5 [I.V.:407.7; IV Piggyback:99.9] Out: 700 [Urine:700]  Vent settings for last 24 hours:    Physical Exam:  General: somnolent, ill-appearing, weak Neuro: oriented and somnolent HEENT/Neck: JVD moderate to severe Resp: wheezes bilaterally and crackles bilaterally, increased resp effort CVS: IRR and tachycardic GI: soft, non-tender, mildly distended Skin: no rash Extremities: edematous UEs and LEs  Assessment/Plan:   Respiratory Distress: -most c/f pulm edema vs atelectasis given CXR, recent sternotomy, and increased resp effort.  Mild c/f pneumonia given purulent sputum and elevated WBC (18.7 today), but pt is afebrile and no consolidation noted on CXR.   -f/u sputum culture from 2/1  -cont ceftriaxone  -cont xopenex   -cont chest physiotherapy   -biPAP  -ABG r/o hypercapnea    Hypervolemic Hyponatremia: -cont Lasix 80mg  IV bid  Afib with RVR: -cont amiodarone 30mg /hr IV -cont milrinone, levophed -cont asa  AKI: -BUN/Creatinine up-trending (34, 1.88) -CTM for now  Hematuria/Dysuria:  -likely 2/2 cath, CTM  Anemia: -Stable, CTM  Critical Care Total Time*: [place] 03/19/2019  *Care during the described time interval was provided by me and/or other providers on the critical care team.  I have reviewed this patient's available data, including medical history, events of note, physical examination and test results as part of my evaluation.

## 2019-03-19 NOTE — Progress Notes (Addendum)
[address]       [place] [address]             [phone]      3 Days Post-Op Procedure(s) (LRB): CORONARY ARTERY BYPASS GRAFTING (CABG), ON PUMP, TIMES THREE, USING LEFT INTERNAL MAMMARY ARTERY, RIGHT GREATER SAPHENOUS VEIN HARVESTED ENDOSCOPICALLY (N/A) MAZE (N/A) CLIPPING OF LEFT  ATRIAL APPENDAGE - USING ATRICLIP SIZE 40 (N/A) TRANSESOPHAGEAL ECHOCARDIOGRAM (TEE) (N/A) Subjective: Significant ronchi/pulmonary congestion Afib with RVR  Objective: Vital signs in last 24 hours: Temp:  [97.5 F (36.4 C)-97.9 F (36.6 C)] 97.5 F (36.4 C) (02/01 1216) Pulse Rate:  [33-121] 118 (02/01 1300) Cardiac Rhythm: Atrial fibrillation (02/01 1200) BP: (76-147)/(43-97) 147/71 (02/01 1300) SpO2:  [75 %-100 %] 96 % (02/01 1300) Weight:  [95.9 kg] 95.9 kg (02/01 0500)  Hemodynamic parameters for last 24 hours:    Intake/Output from previous day: 01/31 0701 - 02/01 0700 In: 1416.9 [I.V.:1416.9] Out: 305 [Urine:215; Chest Tube:90] Intake/Output this shift: Total I/O In: 398.3 [I.V.:298.4; IV Piggyback:99.9] Out: 600 [Urine:600]  General appearance: alert, cooperative, fatigued and no distress Heart: irregularly irregular rhythm and tachy Lungs: coarse with scattered ronchi Abdomen: mild distension, + BS, non-tender Extremities: + edema Wound: EVH  site healing well, chest incision dressing in place  Lab Results: Recent Labs    03/18/19 0534 03/19/19 0345  WBC 18.0* 18.7*  HGB 8.4* 8.1*  HCT 26.4* 25.9*  PLT 124* 168   BMET:  Recent Labs    03/18/19 0534 03/19/19 0345  NA 136 132*  K 4.1 4.4  CL 103 100  CO2 24 24  GLUCOSE 107* 145*  BUN 24* 34*  CREATININE 1.44* 1.88*  CALCIUM 8.2* 8.1*    PT/INR:  Recent Labs    03/16/19 1722  LABPROT 18.6*  INR 1.6*   ABG    Component Value Date/Time   PHART 7.327 (L) 03/17/2019 0348   HCO3 22.8 03/17/2019 0348   TCO2 24 03/17/2019 0348   ACIDBASEDEF 3.0 (H) 03/17/2019 0348   O2SAT 52.4  03/19/2019 0345   CBG (last 3)  Recent Labs    03/19/19 0345 03/19/19 0731 03/19/19 1210  GLUCAP 141* 127* 123*    Meds Scheduled Meds: . acetaminophen  1,000 mg Oral Q6H   Or  . acetaminophen (TYLENOL) oral liquid 160 mg/5 mL  1,000 mg Per Tube Q6H  . aspirin EC  325 mg Oral Daily   Or  . aspirin  324 mg Per Tube Daily  . atorvastatin  80 mg Oral q1800  . bisacodyl  10 mg Oral Daily   Or  . bisacodyl  10 mg Rectal Daily  . Chlorhexidine Gluconate Cloth  6 each Topical Daily  . docusate sodium  200 mg Oral Daily  . enoxaparin (LOVENOX) injection  30 mg Subcutaneous Q24H  . guaiFENesin  600 mg Oral BID  . insulin aspart  0-24 Units Subcutaneous Q4H  . levalbuterol  1.25 mg Nebulization Q6H  . mouth rinse  15 mL Mouth Rinse BID  . pantoprazole  40 mg Oral Daily  . sodium chloride flush  3 mL Intravenous Q12H   Continuous Infusions: . sodium chloride    . sodium chloride    . sodium chloride 10 mL/hr (03/16/19 1710)  . amiodarone 30 mg/hr (03/19/19 1300)  . cefTRIAXone (ROCEPHIN)  IV Stopped (03/19/19 1001)  . lactated ringers 10 mL/hr at 03/19/19 1300  . milrinone 0.25 mcg/kg/min (03/19/19 1300)  . norepinephrine (LEVOPHED) Adult  infusion 5 mcg/min (03/19/19 1300)   PRN Meds:.sodium chloride, dextrose, morphine injection, ondansetron (ZOFRAN) IV, oxyCODONE, sodium chloride flush, sodium chloride flush, traMADol  Xrays DG Chest Port 1 View  Result Date: 03/19/2019 CLINICAL DATA:  History of bypass surgery. EXAM: PORTABLE CHEST 1 VIEW COMPARISON:  03/17/2019 FINDINGS: The endotracheal tube, NG tube and Swan-Ganz catheter have been removed. The right IJ Cordis is still in place. Bilateral chest tubes have been removed. No pneumothorax is identified. Stable mild cardiac enlargement and persistent mild interstitial edema and small left effusion with bibasilar atelectasis. IMPRESSION: 1. Removal of support apparatus as above.  No pneumothorax. 2. Slight worsening aeration post  extubation with mild edema, small left effusion and bibasilar atelectasis. Electronically Signed   By: Marijo Sanes M.D.   On: 03/19/2019 06:00   [place] EKG SITE RITE  Result Date: 03/19/2019 If Site Rite image not attached, placement could not be confirmed due to current cardiac rhythm.   Assessment/Plan: S/P Procedure(s) (LRB): CORONARY ARTERY BYPASS GRAFTING (CABG), ON PUMP, TIMES THREE, USING LEFT INTERNAL MAMMARY ARTERY, RIGHT GREATER SAPHENOUS VEIN HARVESTED ENDOSCOPICALLY (N/A) MAZE (N/A) CLIPPING OF LEFT  ATRIAL APPENDAGE - USING ATRICLIP SIZE 40 (N/A) TRANSESOPHAGEAL ECHOCARDIOGRAM (TEE) (N/A)  1 hemodynamics fairly stable on milinone and levophed gtts. Co-OX 52 this am. + volume overload (received 80 IV lasix this am with 600 cc UOP so far. Creat up to 1.88/ BUN 34- will probably need another dose this pm.  2 pulm status -fair, started on xopenex and rocephin. Will add flutter valve. Will add bed Chest PT q 4 hours.Sats ok but on 15 liters HFNC. CXR with worsening ASD ans some pulm edema.  Will ask PCCM to see. 3 leukocytosis is stable with WBC 18K- no fevers, now on Rocephin 4 ABL anemia-H/H pretty stable 5 afib with RVR on amiodarone gtt. 6 BS adeq control  LOS: 5 days    [facility] 03/19/2019 Pager [phone]  Rising creatinine, dropping O2 sats with heavy thin airway secretions he cannot clear\   NTS x 2 , cultures submitted and ceftriaxone started.  Back in rapid afib, BP stable on low dose mil, norepei  patient examined and medical record reviewed,agree with above note. Tharon Aquas Trigt III 03/19/2019

## 2019-03-19 NOTE — Progress Notes (Signed)
Progress Note  Patient Name: Jesse Murphy Date of Encounter: 03/19/2019  Primary Cardiologist: Kate Sable, MD    Subjective   86 year old gentleman with a history of coronary artery disease-status post coronary artery bypass grafting.  He had preoperative atrial fibrillation.  He had clipping of the left atrial appendage at the time of surgery.  Still has some congestion.  On several pressers.   Inpatient Medications    Scheduled Meds: . acetaminophen  1,000 mg Oral Q6H   Or  . acetaminophen (TYLENOL) oral liquid 160 mg/5 mL  1,000 mg Per Tube Q6H  . aspirin EC  325 mg Oral Daily   Or  . aspirin  324 mg Per Tube Daily  . atorvastatin  80 mg Oral q1800  . bisacodyl  10 mg Oral Daily   Or  . bisacodyl  10 mg Rectal Daily  . Chlorhexidine Gluconate Cloth  6 each Topical Daily  . docusate sodium  200 mg Oral Daily  . enoxaparin (LOVENOX) injection  30 mg Subcutaneous Q24H  . guaiFENesin  600 mg Oral BID  . insulin aspart  0-24 Units Subcutaneous Q4H  . levalbuterol  1.25 mg Nebulization Q6H  . mouth rinse  15 mL Mouth Rinse BID  . pantoprazole  40 mg Oral Daily  . sodium chloride flush  3 mL Intravenous Q12H   Continuous Infusions: . sodium chloride    . sodium chloride    . sodium chloride 10 mL/hr (03/16/19 1710)  . amiodarone 60 mg/hr (03/19/19 0800)  . cefTRIAXone (ROCEPHIN)  IV    . lactated ringers 10 mL/hr at 03/19/19 0800  . milrinone 0.125 mcg/kg/min (03/19/19 0800)  . norepinephrine (LEVOPHED) Adult infusion 5 mcg/min (03/19/19 0822)   PRN Meds: sodium chloride, dextrose, morphine injection, ondansetron (ZOFRAN) IV, oxyCODONE, sodium chloride flush, sodium chloride flush, traMADol   Vital Signs    Vitals:   03/19/19 0600 03/19/19 0700 03/19/19 0800 03/19/19 0811  BP:  102/64 (!) 89/60   Pulse: 80 (!) 33 64   Resp:      Temp:    (!) 97.5 F (36.4 C)  TempSrc:    Oral  SpO2: 93% (!) 75% 94%   Weight:      Height:        Intake/Output  Summary (Last 24 hours) at 03/19/2019 0851 Last data filed at 03/19/2019 0800 Gross per 24 hour  Intake 1413.91 ml  Output 365 ml  Net 1048.91 ml   Last 3 Weights 03/19/2019 03/18/2019 03/17/2019  Weight (lbs) 211 lb 6.7 oz 210 lb 12.2 oz 211 lb 13.8 oz  Weight (kg) 95.9 kg 95.6 kg 96.1 kg      Telemetry    Af with HR of 114  - Personally Reviewed  ECG     - Personally Reviewed  Physical Exam   GEN: No acute distress.    Elderly male,  + congestion  Neck: No JVD Cardiac:  Irreg.   Irreg  Respiratory: Clear to auscultation bilaterally. GI: Soft, nontender, non-distended  MS: No edema; No deformity. Neuro:  Nonfocal  Psych: Normal affect   Labs    High Sensitivity Troponin:   Recent Labs  Lab 03/13/19 1056 03/13/19 1251 03/13/19 2257 03/14/19 0402  TROPONINIHS 32* 440* 926* 585*      Chemistry Recent Labs  Lab 03/15/19 1453 03/16/19 0444 03/17/19 1621 03/18/19 0534 03/19/19 0345  NA 136   < > 137 136 132*  K 4.1   < > 4.6 4.1  4.4  CL 100   < > 103 103 100  CO2 26   < > 24 24 24   GLUCOSE 144*   < > 129* 107* 145*  BUN 15   < > 19 24* 34*  CREATININE 1.02   < > 1.43* 1.44* 1.88*  CALCIUM 8.7*   < > 8.2* 8.2* 8.1*  PROT 6.3*  --   --   --   --   ALBUMIN 3.3*  --   --   --   --   AST 18  --   --   --   --   ALT 10  --   --   --   --   ALKPHOS 56  --   --   --   --   BILITOT 0.7  --   --   --   --   GFRNONAA >60   < > 43* 43* 31*  GFRAA >60   < > 50* 50* 36*  ANIONGAP 10   < > 10 9 8    < > = values in this interval not displayed.     Hematology Recent Labs  Lab 03/17/19 1621 03/18/19 0534 03/19/19 0345  WBC 17.8* 18.0* 18.7*  RBC 2.87* 2.86* 2.76*  HGB 8.6* 8.4* 8.1*  HCT 26.7* 26.4* 25.9*  MCV 93.0 92.3 93.8  MCH 30.0 29.4 29.3  MCHC 32.2 31.8 31.3  RDW 14.6 14.7 14.9  PLT 123* 124* 168    BNPNo results for input(s): BNP, PROBNP in the last 168 hours.   DDimer No results for input(s): DDIMER in the last 168 hours.   Radiology    DG Chest  Port 1 View  Result Date: 03/19/2019 CLINICAL DATA:  History of bypass surgery. EXAM: PORTABLE CHEST 1 VIEW COMPARISON:  03/17/2019 FINDINGS: The endotracheal tube, NG tube and Swan-Ganz catheter have been removed. The right IJ Cordis is still in place. Bilateral chest tubes have been removed. No pneumothorax is identified. Stable mild cardiac enlargement and persistent mild interstitial edema and small left effusion with bibasilar atelectasis. IMPRESSION: 1. Removal of support apparatus as above.  No pneumothorax. 2. Slight worsening aeration post extubation with mild edema, small left effusion and bibasilar atelectasis. Electronically Signed   By: Marijo Sanes M.D.   On: 03/19/2019 06:00   [place] EKG SITE RITE  Result Date: 03/19/2019 If Site Rite image not attached, placement could not be confirmed due to current cardiac rhythm.   Cardiac Studies     Patient Profile     86 y.o. male with CAD,  Atrial fib   Assessment & Plan    1.  CAD -  S/p CABG. No angina   2.  Atrial fib:  Had AF preop .   Now has RVR.   Likely due to post op state and levophed.  Cont supportive care for now.  3.  Pulmonary congestion. Encouraged deeper breaths  Getting nebs,  Incentive spirometry .      For questions or updates, please contact [place] Please consult [email or web address] for contact info under        Signed, Mertie Moores, MD  03/19/2019, 8:51 AM

## 2019-03-20 ENCOUNTER — Inpatient Hospital Stay (HOSPITAL_COMMUNITY): Payer: Medicare Other

## 2019-03-20 DIAGNOSIS — J9601 Acute respiratory failure with hypoxia: Secondary | ICD-10-CM

## 2019-03-20 DIAGNOSIS — I255 Ischemic cardiomyopathy: Secondary | ICD-10-CM

## 2019-03-20 LAB — POCT I-STAT 7, (LYTES, BLD GAS, ICA,H+H)
Acid-base deficit: 1 mmol/L (ref 0.0–2.0)
Acid-base deficit: 2 mmol/L (ref 0.0–2.0)
Acid-base deficit: 7 mmol/L — ABNORMAL HIGH (ref 0.0–2.0)
Acid-base deficit: 1 mmol/L (ref 0.0–2.0)
Bicarbonate: 25.1 mmol/L (ref 20.0–28.0)
Bicarbonate: 24.2 mmol/L (ref 20.0–28.0)
Bicarbonate: 17.3 mmol/L — ABNORMAL LOW (ref 20.0–28.0)
Bicarbonate: 23.1 mmol/L (ref 20.0–28.0)
Calcium, Ion: 1.09 mmol/L — ABNORMAL LOW (ref 1.15–1.40)
Calcium, Ion: 1.11 mmol/L — ABNORMAL LOW (ref 1.15–1.40)
Calcium, Ion: 0.97 mmol/L — ABNORMAL LOW (ref 1.15–1.40)
Calcium, Ion: 1.1 mmol/L — ABNORMAL LOW (ref 1.15–1.40)
HCT: 27 % — ABNORMAL LOW (ref 39.0–52.0)
HCT: 27 % — ABNORMAL LOW (ref 39.0–52.0)
HCT: 18 % — ABNORMAL LOW (ref 39.0–52.0)
HCT: 27 % — ABNORMAL LOW (ref 39.0–52.0)
Hemoglobin: 9.2 g/dL — ABNORMAL LOW (ref 13.0–17.0)
Hemoglobin: 9.2 g/dL — ABNORMAL LOW (ref 13.0–17.0)
Hemoglobin: 6.1 g/dL — CL (ref 13.0–17.0)
Hemoglobin: 9.2 g/dL — ABNORMAL LOW (ref 13.0–17.0)
O2 Saturation: 61 %
O2 Saturation: 95 %
O2 Saturation: 100 %
O2 Saturation: 94 %
Patient temperature: 98.3
Patient temperature: 98.4
Patient temperature: 98.3
Patient temperature: 97.3
Potassium: 4 mmol/L (ref 3.5–5.1)
Potassium: 4.1 mmol/L (ref 3.5–5.1)
Potassium: 3 mmol/L — ABNORMAL LOW (ref 3.5–5.1)
Potassium: 3.4 mmol/L — ABNORMAL LOW (ref 3.5–5.1)
Sodium: 133 mmol/L — ABNORMAL LOW (ref 135–145)
Sodium: 134 mmol/L — ABNORMAL LOW (ref 135–145)
Sodium: 141 mmol/L (ref 135–145)
Sodium: 135 mmol/L (ref 135–145)
TCO2: 27 mmol/L (ref 22–32)
TCO2: 26 mmol/L (ref 22–32)
TCO2: 18 mmol/L — ABNORMAL LOW (ref 22–32)
TCO2: 24 mmol/L (ref 22–32)
pCO2 arterial: 45.8 mmHg (ref 32.0–48.0)
pCO2 arterial: 48.8 mmHg — ABNORMAL HIGH (ref 32.0–48.0)
pCO2 arterial: 27.8 mmHg — ABNORMAL LOW (ref 32.0–48.0)
pCO2 arterial: 34.5 mmHg (ref 32.0–48.0)
pH, Arterial: 7.347 — ABNORMAL LOW (ref 7.350–7.450)
pH, Arterial: 7.303 — ABNORMAL LOW (ref 7.350–7.450)
pH, Arterial: 7.4 (ref 7.350–7.450)
pH, Arterial: 7.431 (ref 7.350–7.450)
pO2, Arterial: 33 mmHg — CL (ref 83.0–108.0)
pO2, Arterial: 87 mmHg (ref 83.0–108.0)
pO2, Arterial: 200 mmHg — ABNORMAL HIGH (ref 83.0–108.0)
pO2, Arterial: 66 mmHg — ABNORMAL LOW (ref 83.0–108.0)

## 2019-03-20 LAB — BASIC METABOLIC PANEL
Anion gap: 15 (ref 5–15)
Anion gap: 15 (ref 5–15)
BUN: 47 mg/dL — ABNORMAL HIGH (ref 8–23)
BUN: 48 mg/dL — ABNORMAL HIGH (ref 8–23)
CO2: 21 mmol/L — ABNORMAL LOW (ref 22–32)
CO2: 22 mmol/L (ref 22–32)
Calcium: 7.8 mg/dL — ABNORMAL LOW (ref 8.9–10.3)
Calcium: 7.5 mg/dL — ABNORMAL LOW (ref 8.9–10.3)
Chloride: 100 mmol/L (ref 98–111)
Chloride: 99 mmol/L (ref 98–111)
Creatinine, Ser: 2.02 mg/dL — ABNORMAL HIGH (ref 0.61–1.24)
Creatinine, Ser: 1.97 mg/dL — ABNORMAL HIGH (ref 0.61–1.24)
GFR calc Af Amer: 33 mL/min — ABNORMAL LOW (ref >60)
GFR calc Af Amer: 34 mL/min — ABNORMAL LOW (ref >60)
GFR calc non Af Amer: 29 mL/min — ABNORMAL LOW (ref >60)
GFR calc non Af Amer: 29 mL/min — ABNORMAL LOW (ref >60)
Glucose, Bld: 202 mg/dL — ABNORMAL HIGH (ref 70–99)
Glucose, Bld: 165 mg/dL — ABNORMAL HIGH (ref 70–99)
Potassium: 4.2 mmol/L (ref 3.5–5.1)
Potassium: 3.6 mmol/L (ref 3.5–5.1)
Sodium: 136 mmol/L (ref 135–145)
Sodium: 136 mmol/L (ref 135–145)

## 2019-03-20 LAB — GLUCOSE, CAPILLARY
Glucose-Capillary: 123 mg/dL — ABNORMAL HIGH (ref 70–99)
Glucose-Capillary: 133 mg/dL — ABNORMAL HIGH (ref 70–99)
Glucose-Capillary: 139 mg/dL — ABNORMAL HIGH (ref 70–99)
Glucose-Capillary: 139 mg/dL — ABNORMAL HIGH (ref 70–99)
Glucose-Capillary: 144 mg/dL — ABNORMAL HIGH (ref 70–99)
Glucose-Capillary: 150 mg/dL — ABNORMAL HIGH (ref 70–99)
Glucose-Capillary: 155 mg/dL — ABNORMAL HIGH (ref 70–99)

## 2019-03-20 LAB — CBC
HCT: 26.2 % — ABNORMAL LOW (ref 39.0–52.0)
HCT: 23.6 % — ABNORMAL LOW (ref 39.0–52.0)
HCT: 26.9 % — ABNORMAL LOW (ref 39.0–52.0)
Hemoglobin: 8.4 g/dL — ABNORMAL LOW (ref 13.0–17.0)
Hemoglobin: 7.4 g/dL — ABNORMAL LOW (ref 13.0–17.0)
Hemoglobin: 8.6 g/dL — ABNORMAL LOW (ref 13.0–17.0)
MCH: 29.7 pg (ref 26.0–34.0)
MCH: 28.9 pg (ref 26.0–34.0)
MCH: 29.2 pg (ref 26.0–34.0)
MCHC: 32.1 g/dL (ref 30.0–36.0)
MCHC: 31.4 g/dL (ref 30.0–36.0)
MCHC: 32 g/dL (ref 30.0–36.0)
MCV: 92.6 fL (ref 80.0–100.0)
MCV: 92.2 fL (ref 80.0–100.0)
MCV: 91.2 fL (ref 80.0–100.0)
Platelets: 232 10*3/uL (ref 150–400)
Platelets: 232 10*3/uL (ref 150–400)
Platelets: 243 10*3/uL (ref 150–400)
RBC: 2.83 MIL/uL — ABNORMAL LOW (ref 4.22–5.81)
RBC: 2.56 MIL/uL — ABNORMAL LOW (ref 4.22–5.81)
RBC: 2.95 MIL/uL — ABNORMAL LOW (ref 4.22–5.81)
RDW: 14.8 % (ref 11.5–15.5)
RDW: 15 % (ref 11.5–15.5)
RDW: 14.3 % (ref 11.5–15.5)
WBC: 13.9 10*3/uL — ABNORMAL HIGH (ref 4.0–10.5)
WBC: 11.9 10*3/uL — ABNORMAL HIGH (ref 4.0–10.5)
WBC: 12.4 10*3/uL — ABNORMAL HIGH (ref 4.0–10.5)
nRBC: 0.3 % — ABNORMAL HIGH (ref 0.0–0.2)
nRBC: 1.7 % — ABNORMAL HIGH (ref 0.0–0.2)
nRBC: 1.1 % — ABNORMAL HIGH (ref 0.0–0.2)

## 2019-03-20 LAB — COMPREHENSIVE METABOLIC PANEL
ALT: 40 U/L (ref 0–44)
AST: 57 U/L — ABNORMAL HIGH (ref 15–41)
Albumin: 2.9 g/dL — ABNORMAL LOW (ref 3.5–5.0)
Alkaline Phosphatase: 67 U/L (ref 38–126)
Anion gap: 15 (ref 5–15)
BUN: 41 mg/dL — ABNORMAL HIGH (ref 8–23)
CO2: 21 mmol/L — ABNORMAL LOW (ref 22–32)
Calcium: 7.8 mg/dL — ABNORMAL LOW (ref 8.9–10.3)
Chloride: 96 mmol/L — ABNORMAL LOW (ref 98–111)
Creatinine, Ser: 1.91 mg/dL — ABNORMAL HIGH (ref 0.61–1.24)
GFR calc Af Amer: 35 mL/min — ABNORMAL LOW (ref >60)
GFR calc non Af Amer: 31 mL/min — ABNORMAL LOW (ref >60)
Glucose, Bld: 143 mg/dL — ABNORMAL HIGH (ref 70–99)
Potassium: 3.9 mmol/L (ref 3.5–5.1)
Sodium: 132 mmol/L — ABNORMAL LOW (ref 135–145)
Total Bilirubin: 1.2 mg/dL (ref 0.3–1.2)
Total Protein: 6 g/dL — ABNORMAL LOW (ref 6.5–8.1)

## 2019-03-20 LAB — MAGNESIUM
Magnesium: 2.6 mg/dL — ABNORMAL HIGH (ref 1.7–2.4)
Magnesium: 2.5 mg/dL — ABNORMAL HIGH (ref 1.7–2.4)

## 2019-03-20 LAB — COOXEMETRY PANEL
Carboxyhemoglobin: 1.1 % (ref 0.5–1.5)
Methemoglobin: 0.9 % (ref 0.0–1.5)
O2 Saturation: 66.2 %
Total hemoglobin: 8.4 g/dL — ABNORMAL LOW (ref 12.0–16.0)

## 2019-03-20 LAB — BLOOD GAS, ARTERIAL
Acid-Base Excess: 0.2 mmol/L (ref 0.0–2.0)
Bicarbonate: 25.6 mmol/L (ref 20.0–28.0)
Drawn by: 347621
FIO2: 40
O2 Saturation: 80.6 %
Patient temperature: 36.9
pCO2 arterial: 51.7 mmHg — ABNORMAL HIGH (ref 32.0–48.0)
pH, Arterial: 7.315 — ABNORMAL LOW (ref 7.350–7.450)
pO2, Arterial: 53.7 mmHg — ABNORMAL LOW (ref 83.0–108.0)

## 2019-03-20 LAB — PHOSPHORUS
Phosphorus: 3.9 mg/dL (ref 2.5–4.6)
Phosphorus: 3.4 mg/dL (ref 2.5–4.6)

## 2019-03-20 LAB — ECHOCARDIOGRAM LIMITED
Height: 68 in
Weight: 3439.18 oz

## 2019-03-20 LAB — PREPARE RBC (CROSSMATCH)

## 2019-03-20 LAB — LACTIC ACID, PLASMA: Lactic Acid, Venous: 1.4 mmol/L (ref 0.5–1.9)

## 2019-03-20 MED ORDER — PRO-STAT SUGAR FREE PO LIQD
30.0000 mL | Freq: Three times a day (TID) | ORAL | Status: DC
  Administered 2019-03-20 – 2019-03-27 (×22): 30 mL
  Filled 2019-03-20 (×21): qty 30

## 2019-03-20 MED ORDER — DILTIAZEM HCL-DEXTROSE 125-5 MG/125ML-% IV SOLN (PREMIX)
5.0000 mg/h | INTRAVENOUS | Status: DC
  Filled 2019-03-20: qty 125

## 2019-03-20 MED ORDER — DOCUSATE SODIUM 50 MG/5ML PO LIQD
200.0000 mg | Freq: Every day | ORAL | Status: DC
  Administered 2019-03-20 – 2019-03-21 (×2): 200 mg via ORAL
  Filled 2019-03-20 (×2): qty 20

## 2019-03-20 MED ORDER — PRO-STAT SUGAR FREE PO LIQD: 30.0000 mL | Freq: Two times a day (BID) | ORAL | Status: DC

## 2019-03-20 MED ORDER — DEXMEDETOMIDINE HCL IN NACL 400 MCG/100ML IV SOLN
0.4000 ug/kg/h | INTRAVENOUS | Status: DC
  Administered 2019-03-20: 0.5 ug/kg/h via INTRAVENOUS
  Administered 2019-03-20: 0.4 ug/kg/h via INTRAVENOUS
  Administered 2019-03-21: 0.3 ug/kg/h via INTRAVENOUS
  Administered 2019-03-22 (×2): 0.4 ug/kg/h via INTRAVENOUS
  Administered 2019-03-23: 0.3 ug/kg/h via INTRAVENOUS
  Filled 2019-03-20 (×8): qty 100

## 2019-03-20 MED ORDER — METOPROLOL TARTRATE 5 MG/5ML IV SOLN
INTRAVENOUS | Status: AC
  Administered 2019-03-20: 5 mg
  Filled 2019-03-20: qty 5

## 2019-03-20 MED ORDER — VITAL AF 1.2 CAL PO LIQD
1000.0000 mL | ORAL | Status: DC
  Administered 2019-03-20 – 2019-03-26 (×6): 1000 mL
  Filled 2019-03-20 (×2): qty 1000

## 2019-03-20 MED ORDER — FUROSEMIDE 10 MG/ML IJ SOLN
80.0000 mg | Freq: Once | INTRAMUSCULAR | Status: AC
  Administered 2019-03-20: 80 mg via INTRAVENOUS
  Filled 2019-03-20: qty 8

## 2019-03-20 MED ORDER — FENTANYL CITRATE (PF) 100 MCG/2ML IJ SOLN
50.0000 ug | Freq: Once | INTRAMUSCULAR | Status: AC
  Administered 2019-03-20: 50 ug via INTRAVENOUS

## 2019-03-20 MED ORDER — METOPROLOL TARTRATE 5 MG/5ML IV SOLN: 5.0000 mg | Freq: Four times a day (QID) | INTRAVENOUS | Status: DC

## 2019-03-20 MED ORDER — NOREPINEPHRINE 4 MG/250ML-% IV SOLN
2.0000 ug/min | INTRAVENOUS | Status: DC
  Administered 2019-03-20: 14 ug/min via INTRAVENOUS
  Administered 2019-03-20: 13 ug/min via INTRAVENOUS
  Administered 2019-03-21: 15 ug/min via INTRAVENOUS
  Filled 2019-03-20 (×3): qty 250

## 2019-03-20 MED ORDER — FUROSEMIDE 10 MG/ML IJ SOLN
10.0000 mg/h | INTRAVENOUS | Status: DC
  Administered 2019-03-20 – 2019-03-22 (×3): 10 mg/h via INTRAVENOUS
  Filled 2019-03-20 (×4): qty 25

## 2019-03-20 MED ORDER — MIDAZOLAM HCL 2 MG/2ML IJ SOLN
1.0000 mg | Freq: Once | INTRAMUSCULAR | Status: AC
  Administered 2019-03-20: 1 mg via INTRAVENOUS

## 2019-03-20 MED ORDER — FENTANYL CITRATE (PF) 100 MCG/2ML IJ SOLN
50.0000 ug | INTRAMUSCULAR | Status: DC | PRN
  Filled 2019-03-20 (×3): qty 2

## 2019-03-20 MED ORDER — FENTANYL CITRATE (PF) 100 MCG/2ML IJ SOLN
INTRAMUSCULAR | Status: AC
  Administered 2019-03-21: 50 ug via INTRAVENOUS
  Filled 2019-03-20: qty 2

## 2019-03-20 MED ORDER — VITAL HIGH PROTEIN PO LIQD: 1000.0000 mL | ORAL | Status: DC

## 2019-03-20 MED ORDER — SODIUM CHLORIDE 0.9% FLUSH: 10.0000 mL | INTRAVENOUS | Status: DC | PRN

## 2019-03-20 MED ORDER — MIDAZOLAM HCL 2 MG/2ML IJ SOLN
2.0000 mg | Freq: Once | INTRAMUSCULAR | Status: AC
  Administered 2019-03-20: 2 mg via INTRAVENOUS

## 2019-03-20 MED ORDER — LIDOCAINE HCL 1 % IJ SOLN: 5.0000 mL | Freq: Once | INTRAMUSCULAR | Status: DC

## 2019-03-20 MED ORDER — SODIUM CHLORIDE 0.9% FLUSH
10.0000 mL | Freq: Two times a day (BID) | INTRAVENOUS | Status: DC
  Administered 2019-03-23: 10 mL
  Administered 2019-03-23: 20 mL
  Administered 2019-03-24: 10 mL
  Administered 2019-03-24: 20 mL
  Administered 2019-03-25 (×2): 10 mL
  Administered 2019-03-26: 20 mL
  Administered 2019-03-26 – 2019-03-28 (×2): 10 mL
  Administered 2019-03-28: 20 mL
  Administered 2019-03-29 – 2019-04-18 (×26): 10 mL
  Administered 2019-04-20: 20 mL
  Administered 2019-04-21 – 2019-04-24 (×6): 10 mL
  Administered 2019-04-25: 30 mL
  Administered 2019-04-25: 10 mL
  Administered 2019-04-26: 30 mL
  Administered 2019-04-26 – 2019-05-01 (×10): 10 mL
  Administered 2019-05-02: 20 mL
  Administered 2019-05-02 – 2019-05-10 (×13): 10 mL
  Administered 2019-05-11: 20 mL
  Administered 2019-05-13: 10 mL
  Administered 2019-05-14: 20 mL
  Administered 2019-05-17: 10 mL

## 2019-03-20 MED ORDER — METOLAZONE 5 MG PO TABS
10.0000 mg | ORAL_TABLET | Freq: Two times a day (BID) | ORAL | Status: AC
  Administered 2019-03-20 – 2019-03-22 (×4): 10 mg via ORAL
  Filled 2019-03-20 (×4): qty 2

## 2019-03-20 MED ORDER — ROCURONIUM BROMIDE 50 MG/5ML IV SOLN
100.0000 mg | Freq: Once | INTRAVENOUS | Status: AC
  Administered 2019-03-20: 100 mg via INTRAVENOUS

## 2019-03-20 MED ORDER — CHLORHEXIDINE GLUCONATE 0.12% ORAL RINSE (MEDLINE KIT)
15.0000 mL | Freq: Two times a day (BID) | OROMUCOSAL | Status: DC
  Administered 2019-03-20 – 2019-03-23 (×7): 15 mL via OROMUCOSAL

## 2019-03-20 MED ORDER — PERFLUTREN LIPID MICROSPHERE
1.0000 mL | INTRAVENOUS | Status: AC | PRN
  Administered 2019-03-20: 2 mL via INTRAVENOUS
  Filled 2019-03-20: qty 10

## 2019-03-20 MED ORDER — METOPROLOL TARTRATE 25 MG/10 ML ORAL SUSPENSION
25.0000 mg | Freq: Two times a day (BID) | ORAL | Status: DC
  Administered 2019-03-20: 25 mg
  Filled 2019-03-20: qty 10

## 2019-03-20 MED ORDER — ORAL CARE MOUTH RINSE
15.0000 mL | OROMUCOSAL | Status: DC
  Administered 2019-03-20 – 2019-03-23 (×32): 15 mL via OROMUCOSAL

## 2019-03-20 MED ORDER — LIDOCAINE HCL (PF) 1 % IJ SOLN
INTRAMUSCULAR | Status: AC
  Filled 2019-03-20: qty 5

## 2019-03-20 MED ORDER — MIDAZOLAM HCL 2 MG/2ML IJ SOLN
INTRAMUSCULAR | Status: AC
  Filled 2019-03-20: qty 2

## 2019-03-20 MED ORDER — AMIODARONE LOAD VIA INFUSION
150.0000 mg | Freq: Once | INTRAVENOUS | Status: AC
  Administered 2019-03-20: 150 mg via INTRAVENOUS
  Filled 2019-03-20: qty 83.34

## 2019-03-20 MED ORDER — POTASSIUM CHLORIDE 20 MEQ/15ML (10%) PO SOLN
20.0000 meq | ORAL | Status: AC
  Administered 2019-03-20 – 2019-03-21 (×3): 20 meq
  Filled 2019-03-20 (×3): qty 15

## 2019-03-20 MED ORDER — METOLAZONE 5 MG PO TABS
5.0000 mg | ORAL_TABLET | Freq: Once | ORAL | Status: AC
  Administered 2019-03-20: 11:00:00 5 mg via ORAL
  Filled 2019-03-20: qty 1

## 2019-03-20 MED ORDER — LIDOCAINE 1% INJECTION FOR CIRCUMCISION
5.0000 mL | INJECTION | Freq: Once | INTRAVENOUS | Status: DC
  Filled 2019-03-20: qty 5

## 2019-03-20 MED ORDER — ETOMIDATE 2 MG/ML IV SOLN
10.0000 mg | Freq: Once | INTRAVENOUS | Status: AC
  Administered 2019-03-20: 10 mg via INTRAVENOUS

## 2019-03-20 MED FILL — Lidocaine HCl Local Soln Prefilled Syringe 100 MG/5ML (2%): INTRAMUSCULAR | Qty: 10 | Status: AC

## 2019-03-20 MED FILL — Magnesium Sulfate Inj 50%: INTRAMUSCULAR | Qty: 10 | Status: AC

## 2019-03-20 MED FILL — Sodium Bicarbonate IV Soln 8.4%: INTRAVENOUS | Qty: 50 | Status: AC

## 2019-03-20 MED FILL — Mannitol IV Soln 20%: INTRAVENOUS | Qty: 500 | Status: AC

## 2019-03-20 MED FILL — Heparin Sodium (Porcine) Inj 1000 Unit/ML: INTRAMUSCULAR | Qty: 30 | Status: AC

## 2019-03-20 MED FILL — Potassium Chloride Inj 2 mEq/ML: INTRAVENOUS | Qty: 40 | Status: AC

## 2019-03-20 MED FILL — Electrolyte-R (PH 7.4) Solution: INTRAVENOUS | Qty: 3000 | Status: AC

## 2019-03-20 MED FILL — Sodium Chloride IV Soln 0.9%: INTRAVENOUS | Qty: 3000 | Status: AC

## 2019-03-20 MED FILL — Heparin Sodium (Porcine) Inj 1000 Unit/ML: INTRAMUSCULAR | Qty: 40 | Status: AC

## 2019-03-20 NOTE — Progress Notes (Signed)
Progress Note  Patient Name: Jesse Murphy Date of Encounter: 03/20/2019  Primary Cardiologist: Kate Sable, MD    Subjective   86 year old gentleman with a history of coronary artery disease-status post coronary artery bypass grafting.  He had preoperative atrial fibrillation.  He had clipping of the left atrial appendage at the time of surgery. He had lots of congestion yesterday Overnight, he developed respiratory distress and is now intubated.  Hypotensive  PCCM is seeing    Inpatient Medications    Scheduled Meds:  acetaminophen  1,000 mg Oral Q6H   Or   acetaminophen (TYLENOL) oral liquid 160 mg/5 mL  1,000 mg Per Tube Q6H   aspirin EC  325 mg Oral Daily   Or   aspirin  324 mg Per Tube Daily   atorvastatin  80 mg Oral q1800   bisacodyl  10 mg Oral Daily   Or   bisacodyl  10 mg Rectal Daily   chlorhexidine gluconate (MEDLINE KIT)  15 mL Mouth Rinse BID   Chlorhexidine Gluconate Cloth  6 each Topical Daily   docusate sodium  200 mg Oral Daily   enoxaparin (LOVENOX) injection  30 mg Subcutaneous Q24H   feeding supplement (PRO-STAT SUGAR FREE 64)  30 mL Per Tube BID   feeding supplement (VITAL HIGH PROTEIN)  1,000 mL Per Tube Q24H   fentaNYL       furosemide  80 mg Intravenous BID   guaiFENesin  600 mg Oral BID   insulin aspart  0-24 Units Subcutaneous Q4H   levalbuterol  1.25 mg Nebulization Q6H   lidocaine  5 mL Intradermal Once   mouth rinse  15 mL Mouth Rinse BID   mouth rinse  15 mL Mouth Rinse 10 times per day   metoCLOPramide (REGLAN) injection  10 mg Intravenous Q6H   midazolam       midazolam       pantoprazole  40 mg Oral Daily   sodium chloride flush  3 mL Intravenous Q12H   Continuous Infusions:  sodium chloride     sodium chloride     sodium chloride 10 mL/hr (03/16/19 1710)   amiodarone 30 mg/hr (03/20/19 0917)   cefTRIAXone (ROCEPHIN)  IV Stopped (03/19/19 1001)   dexmedetomidine (PRECEDEX) IV infusion 0.4  mcg/kg/hr (03/20/19 0700)   diltiazem (CARDIZEM) infusion     lactated ringers 10 mL/hr at 03/20/19 0700   milrinone 0.25 mcg/kg/min (03/20/19 0700)   norepinephrine (LEVOPHED) Adult infusion 3 mcg/min (03/20/19 0700)   PRN Meds: sodium chloride, dextrose, fentaNYL (SUBLIMAZE) injection, morphine injection, ondansetron (ZOFRAN) IV, oxyCODONE, sodium chloride flush, sodium chloride flush, traMADol   Vital Signs    Vitals:   03/20/19 0815 03/20/19 0830 03/20/19 0845 03/20/19 0900  BP: 117/65 90/60  (!) 78/53  Pulse: 75 (!) 59 (!) 57 (!) 40  Resp:      Temp: 98.2 F (36.8 C)     TempSrc: Oral     SpO2: 99% 100% 100% 100%  Weight:      Height:        Intake/Output Summary (Last 24 hours) at 03/20/2019 0925 Last data filed at 03/20/2019 0800 Gross per 24 hour  Intake 1464.72 ml  Output 1820 ml  Net -355.28 ml   Last 3 Weights 03/20/2019 03/19/2019 03/18/2019  Weight (lbs) 214 lb 15.2 oz 211 lb 6.7 oz 210 lb 12.2 oz  Weight (kg) 97.5 kg 95.9 kg 95.6 kg      Telemetry    Afib at HR  of 116 - Personally Reviewed  ECG     - Personally Reviewed  Physical Exam   Physical Exam: Blood pressure (!) 78/53, pulse (!) 40, temperature 98.2 F (36.8 C), temperature source Oral, resp. rate (!) 23, height _0  (1.727 m), weight 97.5 kg, SpO2 100 %.  GEN:  Elderly male, on the vent  HEENT: Normal NECK: No JVD; No carotid bruits LYMPHATICS: No lymphadenopathy CARDIAC: Irreg irreg  RESPIRATORY:  Clear to auscultation without rales, wheezing or rhonchi  ABDOMEN: Soft, non-tender, non-distended MUSCULOSKELETAL:  No edema; No deformity  SKIN: Warm and dry NEUROLOGIC:  Alert and oriented x 3  Labs    High Sensitivity Troponin:   Recent Labs  Lab 03/13/19 1056 03/13/19 1251 03/13/19 2257 03/14/19 0402  TROPONINIHS 32* 440* 926* 585*      Chemistry Recent Labs  Lab 03/15/19 1453 03/16/19 0444 03/19/19 0345 03/19/19 1718 03/19/19 1738 03/19/19 1738 03/20/19 0334  03/20/19 0349 03/20/19 0615  NA 136   < > 132*   < > 133*   < > 132* 133* 134*  K 4.1   < > 4.4   < > 4.1   < > 3.9 4.0 4.1  CL 100   < > 100  --  98  --  96*  --   --   CO2 26   < > 24  --  22  --  21*  --   --   GLUCOSE 144*   < > 145*  --  149*  --  143*  --   --   BUN 15   < > 34*  --  43*  --  41*  --   --   CREATININE 1.02   < > 1.88*  --  2.09*  --  1.91*  --   --   CALCIUM 8.7*   < > 8.1*  --  7.9*  --  7.8*  --   --   PROT 6.3*  --   --   --   --   --  6.0*  --   --   ALBUMIN 3.3*  --   --   --   --   --  2.9*  --   --   AST 18  --   --   --   --   --  57*  --   --   ALT 10  --   --   --   --   --  40  --   --   ALKPHOS 56  --   --   --   --   --  67  --   --   BILITOT 0.7  --   --   --   --   --  1.2  --   --   GFRNONAA >60   < > 31*  --  27*  --  31*  --   --   GFRAA >60   < > 36*  --  32*  --  35*  --   --   ANIONGAP 10   < > 8  --  13  --  15  --   --    < > = values in this interval not displayed.     Hematology Recent Labs  Lab 03/18/19 0534 03/18/19 0534 03/19/19 0345 03/19/19 1718 03/20/19 0334 03/20/19 0349 03/20/19 0615  WBC 18.0*  --  18.7*  --  13.9*  --   --  RBC 2.86*  --  2.76*  --  2.83*  --   --   HGB 8.4*   < > 8.1*   < > 8.4* 9.2* 9.2*  HCT 26.4*   < > 25.9*   < > 26.2* 27.0* 27.0*  MCV 92.3  --  93.8  --  92.6  --   --   MCH 29.4  --  29.3  --  29.7  --   --   MCHC 31.8  --  31.3  --  32.1  --   --   RDW 14.7  --  14.9  --  14.8  --   --   PLT 124*  --  168  --  232  --   --    < > = values in this interval not displayed.    BNPNo results for input(s): BNP, PROBNP in the last 168 hours.   DDimer No results for input(s): DDIMER in the last 168 hours.   Radiology    DG Chest Port 1 View  Result Date: 03/20/2019 CLINICAL DATA:  History of bypass surgery. Patient hree intubated. EXAM: PORTABLE CHEST 1 VIEW COMPARISON:  Chest x-ray 03/19/2019 FINDINGS: The endotracheal tube is 4 cm above the carina. The NG tube is coursing down the  esophagus and into the stomach. The right IJ Cordis is in stable position. Stable mild cardiac enlargement. There are bilateral pleural effusions and bibasilar atelectasis. Improved aeration status post intubation with mild persistent pulmonary edema. IMPRESSION: 1. New ET and NG tubes in good position. 2. Persistent but improved pulmonary edema. 3. Persistent effusions and atelectasis. Electronically Signed   By: Marijo Sanes M.D.   On: 03/20/2019 07:01   DG Chest Port 1 View  Result Date: 03/19/2019 CLINICAL DATA:  History of bypass surgery. EXAM: PORTABLE CHEST 1 VIEW COMPARISON:  03/17/2019 FINDINGS: The endotracheal tube, NG tube and Swan-Ganz catheter have been removed. The right IJ Cordis is still in place. Bilateral chest tubes have been removed. No pneumothorax is identified. Stable mild cardiac enlargement and persistent mild interstitial edema and small left effusion with bibasilar atelectasis. IMPRESSION: 1. Removal of support apparatus as above.  No pneumothorax. 2. Slight worsening aeration post extubation with mild edema, small left effusion and bibasilar atelectasis. Electronically Signed   By: Marijo Sanes M.D.   On: 03/19/2019 06:00   [place] EKG SITE RITE  Result Date: 03/19/2019 If Site Rite image not attached, placement could not be confirmed due to current cardiac rhythm.   Cardiac Studies     Patient Profile     86 y.o. male with CAD,  Atrial fib   Assessment & Plan    1.  CAD -  S/p CABG.   2.  Atrial fib:  Had AF preop  No changes. .    .  3.  Respiratory failure:  Was intubated last night.   echo is scheduled for today  Echo from several days ago showed EF of 30-35%. I/O are + 6.7 liters during this admission      For questions or updates, please contact [place] Please consult [email or web address] for contact info under        Signed, Mertie Moores, MD  03/20/2019, 9:25 AM

## 2019-03-20 NOTE — Procedures (Signed)
Arterial Catheter Insertion Procedure Note Jesse Murphy [id number] 01-05-1929  Procedure: Insertion of Arterial Catheter  Indications: Blood pressure monitoring and Frequent blood sampling  Procedure Details Consent: Unable to obtain consent because of emergent medical necessity. Time Out: Verified patient identification, verified procedure, site/side was marked, verified correct patient position, special equipment/implants available, medications/allergies/relevent history reviewed, required imaging and test results available.  Performed  Maximum sterile technique was used including antiseptics, cap, gloves, gown, hand hygiene, mask and sheet. Skin prep: Chlorhexidine; local anesthetic administered 20 gauge catheter was inserted into right femoral artery using the Seldinger technique. ULTRASOUND GUIDANCE USED: YES Evaluation Blood flow good; BP tracing good. Complications: No apparent complications.   [name] 03/20/2019

## 2019-03-20 NOTE — Progress Notes (Addendum)
TCTS DAILY ICU PROGRESS NOTE                   [address]            [place] [id number]          [phone]   4 Days Post-Op Procedure(s) (LRB): CORONARY ARTERY BYPASS GRAFTING (CABG), ON PUMP, TIMES THREE, USING LEFT INTERNAL MAMMARY ARTERY, RIGHT GREATER SAPHENOUS VEIN HARVESTED ENDOSCOPICALLY (N/A) MAZE (N/A) CLIPPING OF LEFT  ATRIAL APPENDAGE - USING ATRICLIP SIZE 40 (N/A) TRANSESOPHAGEAL ECHOCARDIOGRAM (TEE) (N/A)  Total Length of Stay:  LOS: 6 days   Subjective: Events of last night reviewed  Objective: Vital signs in last 24 hours: Temp:  [97.5 F (36.4 C)-98.9 F (37.2 C)] 98.9 F (37.2 C) (02/02 0400) Pulse Rate:  [36-153] 141 (02/02 0744) Cardiac Rhythm: Atrial fibrillation (02/02 0530) Resp:  [18-30] 23 (02/02 0744) BP: (82-155)/(49-123) 97/52 (02/02 0744) SpO2:  [68 %-100 %] 100 % (02/02 0744) Arterial Line BP: (132-147)/(61-89) 134/61 (02/02 0630) FiO2 (%):  [40 %-80 %] 50 % (02/02 0744) Weight:  [97.5 kg] 97.5 kg (02/02 0500)  Filed Weights   03/18/19 0500 03/19/19 0500 03/20/19 0500  Weight: 95.6 kg 95.9 kg 97.5 kg    Weight change: 1.6 kg   Hemodynamic parameters for last 24 hours:     Vent Mode: PRVC FiO2 (%):  [40 %-80 %] 50 % Set Rate:  [18 bmp-20 bmp] 20 bmp Vt Set:  [550 mL] 550 mL PEEP:  [5 cmH20] 5 cmH20 Plateau Pressure:  [9 cmH20-18 cmH20] 9 cmH20    Intake/Output from previous day: 02/01 0701 - 02/02 0700 In: 1491.6 [I.V.:1391.7; IV Piggyback:99.9] Out: 1920 [CVELF:8101]  Intake/Output this shift: No intake/output data recorded.  Current Meds: Scheduled Meds: . acetaminophen  1,000 mg Oral Q6H   Or  . acetaminophen (TYLENOL) oral liquid 160 mg/5 mL  1,000 mg Per Tube Q6H  . aspirin EC  325 mg Oral Daily   Or  . aspirin  324 mg Per Tube Daily  . atorvastatin  80 mg Oral q1800  . bisacodyl  10 mg Oral Daily   Or  . bisacodyl  10 mg Rectal Daily  . chlorhexidine gluconate (MEDLINE KIT)  15 mL Mouth  Rinse BID  . Chlorhexidine Gluconate Cloth  6 each Topical Daily  . docusate sodium  200 mg Oral Daily  . enoxaparin (LOVENOX) injection  30 mg Subcutaneous Q24H  . fentaNYL      . furosemide  80 mg Intravenous BID  . guaiFENesin  600 mg Oral BID  . insulin aspart  0-24 Units Subcutaneous Q4H  . levalbuterol  1.25 mg Nebulization Q6H  . lidocaine  5 mL Intradermal Once  . mouth rinse  15 mL Mouth Rinse BID  . mouth rinse  15 mL Mouth Rinse 10 times per day  . metoCLOPramide (REGLAN) injection  10 mg Intravenous Q6H  . midazolam      . midazolam      . pantoprazole  40 mg Oral Daily  . sodium chloride flush  3 mL Intravenous Q12H   Continuous Infusions: . sodium chloride    . sodium chloride    . sodium chloride 10 mL/hr (03/16/19 1710)  . amiodarone 30 mg/hr (03/20/19 0700)  . cefTRIAXone (ROCEPHIN)  IV Stopped (03/19/19 1001)  . dexmedetomidine (PRECEDEX) IV infusion 0.4 mcg/kg/hr (03/20/19 0700)  . lactated ringers 10 mL/hr at 03/20/19 0700  . milrinone 0.25 mcg/kg/min (03/20/19 0700)  .  norepinephrine (LEVOPHED) Adult infusion 3 mcg/min (03/20/19 0700)   PRN Meds:.sodium chloride, dextrose, fentaNYL (SUBLIMAZE) injection, morphine injection, ondansetron (ZOFRAN) IV, oxyCODONE, sodium chloride flush, sodium chloride flush, traMADol  General appearance: slowed mentation and follows simple commands Heart: irregularly irregular rhythm, regularly irregular rhythm and tachy Lungs: dim in lower fields Abdomen: mild/mod distension, soft, nontender Extremities: + edema Wound: incis ok  Lab Results: CBC: Recent Labs    03/19/19 0345 03/19/19 1718 03/20/19 0334 03/20/19 0334 03/20/19 0349 03/20/19 0615  WBC 18.7*  --  13.9*  --   --   --   HGB 8.1*   < > 8.4*   < > 9.2* 9.2*  HCT 25.9*   < > 26.2*   < > 27.0* 27.0*  PLT 168  --  232  --   --   --    < > = values in this interval not displayed.   BMET:  Recent Labs    03/19/19 1738 03/19/19 1738 03/20/19 0334  03/20/19 0334 03/20/19 0349 03/20/19 0615  NA 133*   < > 132*   < > 133* 134*  K 4.1   < > 3.9   < > 4.0 4.1  CL 98  --  96*  --   --   --   CO2 22  --  21*  --   --   --   GLUCOSE 149*  --  143*  --   --   --   BUN 43*  --  41*  --   --   --   CREATININE 2.09*  --  1.91*  --   --   --   CALCIUM 7.9*  --  7.8*  --   --   --    < > = values in this interval not displayed.    CMET: Lab Results  Component Value Date   WBC 13.9 (H) 03/20/2019   HGB 9.2 (L) 03/20/2019   HCT 27.0 (L) 03/20/2019   PLT 232 03/20/2019   GLUCOSE 143 (H) 03/20/2019   CHOL 117 03/16/2019   TRIG 52 03/16/2019   HDL 33 (L) 03/16/2019   LDLCALC 74 03/16/2019   ALT 40 03/20/2019   AST 57 (H) 03/20/2019   NA 134 (L) 03/20/2019   K 4.1 03/20/2019   CL 96 (L) 03/20/2019   CREATININE 1.91 (H) 03/20/2019   BUN 41 (H) 03/20/2019   CO2 21 (L) 03/20/2019   TSH 3.683 03/13/2019   INR 1.6 (H) 03/16/2019   HGBA1C 6.1 (H) 03/13/2019   ABG    Component Value Date/Time   PHART 7.303 (L) 03/20/2019 0615   PCO2ART 48.8 (H) 03/20/2019 0615   PO2ART 87.0 03/20/2019 0615   HCO3 24.2 03/20/2019 0615   TCO2 26 03/20/2019 0615   ACIDBASEDEF 2.0 03/20/2019 0615   O2SAT 95.0 03/20/2019 0615     PT/INR: No results for input(s): LABPROT, INR in the last 72 hours. Radiology: [facility] Chest Port 1 View  Result Date: 03/20/2019 CLINICAL DATA:  History of bypass surgery. Patient hree intubated. EXAM: PORTABLE CHEST 1 VIEW COMPARISON:  Chest x-ray 03/19/2019 FINDINGS: The endotracheal tube is 4 cm above the carina. The NG tube is coursing down the esophagus and into the stomach. The right IJ Cordis is in stable position. Stable mild cardiac enlargement. There are bilateral pleural effusions and bibasilar atelectasis. Improved aeration status post intubation with mild persistent pulmonary edema. IMPRESSION: 1. New ET and NG tubes in good position. 2. Persistent but  improved pulmonary edema. 3. Persistent effusions and atelectasis.  Electronically Signed   By: Marijo Sanes M.D.   On: 03/20/2019 07:01   [place] EKG SITE RITE  Result Date: 03/19/2019 If Site Rite image not attached, placement could not be confirmed due to current cardiac rhythm.    Assessment/Plan: S/P Procedure(s) (LRB): CORONARY ARTERY BYPASS GRAFTING (CABG), ON PUMP, TIMES THREE, USING LEFT INTERNAL MAMMARY ARTERY, RIGHT GREATER SAPHENOUS VEIN HARVESTED ENDOSCOPICALLY (N/A) MAZE (N/A) CLIPPING OF LEFT  ATRIAL APPENDAGE - USING ATRICLIP SIZE 40 (N/A) TRANSESOPHAGEAL ECHOCARDIOGRAM (TEE) (N/A)  1 stable on vent, ABG looks reasonable on current settings- CCM managing, hopefully can extubate relatively soon 2 significant volume overload, 1900 cc of urine yesterday- will need more diuretics, Creat up a little further to 1.91/ BUN 41- urine a lootle bloody. On Flomax previously so prob h/o BPH- now has foley 3 afib with RVR- , will add beta blocker to assist with rate control, conts amio gtt 4 otherwise hemodyn stable on levo/milrinone, CO-OX 66- wean as able 5 no fevers, leukocytosis trend improving- cont current abx 6 H/H stable 7 thrombocytopenia resolved 8 lovenox for DVT ppx  John Giovanni [facility] 03/20/2019 7:59 AM  Pager [phone]  Patient will need intubation for several days until fluid removed. Will place feeding tube, start lasix drip  tracheal aspirate with heavy WBC and mixed gram positive/negative. Cont empiric antibiotics and follow culture result  Cont iv amio and low dose iv cardizem for postop rapid afib. Low dose lovenox only for DVT coverage  Cardiac output/coox satisfactory- postop echo pending  patient examined and medical record reviewed,agree with above note. Tharon Aquas Trigt III 03/20/2019

## 2019-03-20 NOTE — Progress Notes (Signed)
Per day RN report pt had been A/Ox4 and easily arousable. Upon shift assessment pt hard to arouse and only open eyes after verbal commands. Eyes were not tracking. Not responding to commands. CCM called to bedside with concerns.

## 2019-03-20 NOTE — Progress Notes (Signed)
Peripherally Inserted Central Catheter/Midline Placement  The IV Nurse has discussed with the patient and/or persons authorized to consent for the patient, the purpose of this procedure and the potential benefits and risks involved with this procedure.  The benefits include less needle sticks, lab draws from the catheter, and the patient may be discharged home with the catheter. Risks include, but not limited to, infection, bleeding, blood clot (thrombus formation), and puncture of an artery; nerve damage and irregular heartbeat and possibility to perform a PICC exchange if needed/ordered by physician.  Alternatives to this procedure were also discussed.  Bard Power PICC patient education guide, fact sheet on infection prevention and patient information card has been provided to patient /or left at bedside.    PICC/Midline Placement Documentation  PICC Triple Lumen 97/28/20 PICC Right Basilic 43 cm 0 cm (Active)  Indication for Insertion or Continuance of Line Vasoactive infusions 03/20/19 1455  Exposed Catheter (cm) 0 cm 03/20/19 1455  Site Assessment Clean;Dry;Intact 03/20/19 1455  Lumen #1 Status Flushed;Blood return noted;Saline locked 03/20/19 1455  Lumen #2 Status Flushed;Blood return noted;Saline locked 03/20/19 1455  Lumen #3 Status Flushed;Blood return noted;Saline locked 03/20/19 1455  Dressing Type Transparent 03/20/19 1455  Dressing Status Clean;Dry;Intact;Antimicrobial disc in place 03/20/19 1455  Dressing Change Due 03/27/19 03/20/19 1455   Daughter Debbie signed the PICC consent    Scotty Court 03/20/2019, 2:56 PM

## 2019-03-20 NOTE — Procedures (Signed)
Intubation Procedure Note Jesse Murphy [id number] 26-Mar-1928  Procedure: Intubation Indications: Airway protection and maintenance, hypoxemic respiratory failure.   Procedure Details Consent: Unable to obtain consent because of emergent medical necessity. Time Out: Verified patient identification, verified procedure, site/side was marked, verified correct patient position, special equipment/implants available, medications/allergies/relevent history reviewed, required imaging and test results available.  Performed  Maximum sterile technique was used including cap, gloves, gown, hand hygiene and mask.  3    Evaluation Hemodynamic Status: Transient hypotension treated with pressors; O2 sats: stable throughout Patient's Current Condition: unstable Complications: Complications of hypotension, resolved with temporary increase in levophed to 27mcg.   Patient did tolerate procedure well. Chest X-ray ordered to verify placement.  CXR: pending.   Jesse Murphy 03/20/2019

## 2019-03-20 NOTE — Progress Notes (Addendum)
Called to bedside to evaluate patient due to the persistent hypercarbia hyopxemia despite bipap.  Worsening somnolence.  Minimally opens eyes, otherwise non responsive.  Not withdrawing to pain.   B rhonchi.  Sat 99% on FIO2 50% but PaO2 only 50s. Ventilating well, CO2 in 43s.  Tachypneic to high 30s  Afib, HR was in 120-130s overnight, now sustaining in 140s.   AMS Possibly 2/2 worsening Uremia?, vs other metabolic cause?  Does not appear 2/2 hypercarbia, and has been non focal.   Consider Head imaging if unable to pinpoint metabolic cause.   Intubate now for airway protection, worsening hypoxemia.   Family updated (daughter Jackelyn Poling answered phone)

## 2019-03-20 NOTE — Progress Notes (Addendum)
  Echocardiogram 2D Echocardiogram has been performed.  Unable to utilize definity due to IV difficulty. Nurse notified.   Ariani Seier A Rosi Secrist 03/20/2019, 12:04 PM

## 2019-03-20 NOTE — Progress Notes (Deleted)
  Echocardiogram 2D Echocardiogram has been performed.  Jesse Murphy [name] 03/20/2019, 12:07 PM

## 2019-03-20 NOTE — Progress Notes (Signed)
Initial Nutrition Assessment  DOCUMENTATION CODES:   Not applicable  INTERVENTION:   Tube feeding:  -Vital AF 1.2 @ 20 ml/hr via [name] -Increase by 10 ml Q6 hours to goal rate of 50 ml/hr (1200 ml) -30 ml Prostat TID  Provides: 1740 kcals, 135 grams protein, 973 ml free water.   NUTRITION DIAGNOSIS:   Increased nutrient needs related to post-op healing as evidenced by estimated needs.  GOAL:   Patient will meet greater than or equal to 90% of their needs  MONITOR:   Weight trends, Diet advancement, Vent status, Skin, TF tolerance, Labs, I & O's  REASON FOR ASSESSMENT:   Consult Enteral/tube feeding initiation and management  ASSESSMENT:   Patient with PMH significant for CAD s/p PCI, prostate cancer, HTN, CHF, and DM. Presents this admission with chronic a.fib with RVR.   1/28- s/p L heart cath  1/30- s/p CABG x3, MAZE, extubated  2/2- re-intubated   Pt remains volume overloaded. Plan for extended intubation per TCTS. Post pyloric tube placed at bedside. Plan titration of TF to goal.   Weight shows to be stable over the last year. Will utilize 88.3 kg as EDW.   Patient is currently intubated on ventilator support MV: 10.6 L/min Temp (24hrs), Avg:98.3 F (36.8 C), Min:97.8 F (36.6 C), Max:98.9 F (37.2 C)   I/O: +6,275 ml since admit  UOP: 1,920 ml x 24 hrs   Drips: precedex, 250 mg lasix in D5 @ 10 ml/hr, milrinone, levophed Medications: dulcolax, colace, SS novolog, 10 mg reglan QID Labs: Na 134 (L) Cr 1.91-trending down CBG 96-247   NUTRITION - FOCUSED PHYSICAL EXAM:    Most Recent Value  Orbital Region  No depletion  Upper Arm Region  No depletion  Thoracic and Lumbar Region  Unable to assess  Buccal Region  No depletion  Temple Region  Mild depletion  Clavicle Bone Region  No depletion  Clavicle and Acromion Bone Region  No depletion  Scapular Bone Region  Unable to assess  Dorsal Hand  No depletion  Patellar Region  No depletion   Anterior Thigh Region  No depletion  Posterior Calf Region  No depletion  Edema (RD Assessment)  Moderate  Hair  Reviewed  Eyes  Unable to assess  Mouth  Unable to assess  Skin  Reviewed  Nails  Reviewed     Diet Order:   Diet Order            Diet NPO time specified  Diet effective now              EDUCATION NEEDS:   Not appropriate for education at this time  Skin:  Skin Assessment: Reviewed RN Assessment Skin Integrity Issues:: Incisions Incisions: chest, R leg  Last BM:  1/26  Height:   Ht Readings from Last 1 Encounters:  03/14/19 5\' 8"  (1.727 m)    Weight:   Wt Readings from Last 1 Encounters:  03/20/19 97.5 kg    Ideal Body Weight:  70 kg  BMI:  Body mass index is 32.68 kg/m.  Estimated Nutritional Needs:   Kcal:  1728 kcal  Protein:  130-145 grams  Fluid:  >/= 1.7 L/day   Mariana Single RD, LDN Clinical Nutrition Pager # - [phone]

## 2019-03-20 NOTE — Progress Notes (Signed)
Rt Note 04:00 held due to Arterial line and Intubation in process.

## 2019-03-20 NOTE — Procedures (Signed)
[name]  Person Inserting Tube:  Jesse Murphy, RD Tube Type:  [name] - 43 inches Tube Location:  Left nare Initial Placement:  Postpyloric Secured by: Bridle Technique Used to Measure Tube Placement:  Documented cm marking at nare/ corner of mouth [name] Secured At:  98 cm    [name] Tube Team Note:  Consult received to place a [name] feeding tube.   X-ray is required, abdominal x-ray has been ordered by the [name] team. Please confirm tube placement before using the [name] tube.   If the tube becomes dislodged please keep the tube and contact the [name] team at [email or web address] (password [email or web address]) for replacement.  If after hours and replacement cannot be delayed, place a NG tube and confirm placement with an abdominal x-ray.    [place], [place], [place] Pager [phone] After Hours Pager

## 2019-03-20 NOTE — Progress Notes (Signed)
eLink Physician-Brief Progress Note Patient Name: Jesse Murphy DOB: 1928-10-15 MRN: [id number]   Date of Service  03/20/2019  HPI/Events of Note  Patient has been on BiPAP x 6 hours without improvement. ABG on BiPAP  = 7.315/51.7/53.7 which is little changed from earlier. Felt to be in pulmonary edema earlier. Nursing feels that he is more lethargic than earlier in shift.   eICU Interventions  Will order: 1. Lasix 80 mg IV X 1 now.  2. Will ask ground team to evaluate the patient at bedside.      Intervention Category Major Interventions: Respiratory failure - evaluation and management  Able Malloy Cornelia Copa 03/20/2019, 12:37 AM

## 2019-03-20 NOTE — Progress Notes (Signed)
Daily Progress Note  Patient Details:    Jesse Murphy is an 86 y.o. male with a h/o CAD, afib, now POD 4 from CABG and clipping of left atrial appendage p/w shortness of breath.   PMHx: CAD, afib  Significant Hospital Events:  1/29 CABG + clipping left atrial appendage 2/1 transfer to CVICU 2/2 SOB 2/2 intubated  Significant Diagnostic Tests:  2/2 CXR shows pulm edema 2/1 CXR shows likely pulm edema 1/29 Intraoperative Echo, EF of 45% 1/26 Preop Echo, EF of 30-35%  Micro Data: 2/1 sputum sent, few G+ cocci, rare G- rods 1/26 MRSA PCR screening negative 1/26 Covid negative  Antimicrobials: Ceftriaxone 2/1 -   Interim Hx/ Subjective: Hypercarbic hypoxemia overnight despite bipap with worsening somnolence.  Patient was ultimately intubated.  He remains afebrile.  Lines, Airways, Drains: Introducer Double Lumen 03/16/19 Internal Jugular Right (Active)  Site Assessment Clean;Dry;Intact 03/19/19 0800  Proximal Lumen Status Capped 03/19/19 0800  Distal Lumen Status Infusing 03/19/19 0800  Dressing Type Transparent;Occlusive 03/19/19 0800  Dressing Status Clean;Dry;Intact;Antimicrobial disc in place 03/19/19 0800  Dressing Interventions Dressing changed 03/19/19 0800  Dressing Change Due 03/26/19 03/19/19 0800     External Urinary Catheter (Active)  Collection Container Standard drainage bag 03/19/19 0800  Securement Method Securing device (Describe) 03/19/19 0800  Site Assessment Clean;Intact;Dry 03/19/19 0800  Output (mL) 0 mL 03/19/19 1500    Anti-infectives:  Anti-infectives (From admission, onward)   Start     Dose/Rate Route Frequency Ordered Stop   03/19/19 1000  cefTRIAXone (ROCEPHIN) 1 g in sodium chloride 0.9 % 100 mL IVPB     1 g 200 mL/hr over 30 Minutes Intravenous Daily 03/19/19 0810     03/16/19 2230  vancomycin (VANCOCIN) IVPB 1000 mg/200 mL premix     1,000 mg 200 mL/hr over 60 Minutes Intravenous  Once 03/16/19 1721 03/17/19 0024   03/16/19 1830   cefUROXime (ZINACEF) 1.5 g in sodium chloride 0.9 % 100 mL IVPB     1.5 g 200 mL/hr over 30 Minutes Intravenous Every 12 hours 03/16/19 1721 03/18/19 0557   03/16/19 0400  vancomycin (VANCOREADY) IVPB 1500 mg/300 mL  Status:  Discontinued     1,500 mg 150 mL/hr over 120 Minutes Intravenous To Surgery 03/15/19 1425 03/16/19 1715   03/16/19 0400  cefUROXime (ZINACEF) 1.5 g in sodium chloride 0.9 % 100 mL IVPB  Status:  Discontinued     1.5 g 200 mL/hr over 30 Minutes Intravenous To Surgery 03/15/19 1425 03/16/19 1715   03/16/19 0400  cefUROXime (ZINACEF) 750 mg in sodium chloride 0.9 % 100 mL IVPB     750 mg 200 mL/hr over 30 Minutes Intravenous To Surgery 03/15/19 1425 03/16/19 1540      Microbiology: Results for orders placed or performed during the hospital encounter of 03/13/19  Respiratory Panel by RT PCR (Flu A&B, Covid) - Nasopharyngeal Swab     Status: None   Collection Time: 03/13/19  1:49 PM   Specimen: Nasopharyngeal Swab  Result Value Ref Range Status   SARS Coronavirus 2 by RT PCR NEGATIVE NEGATIVE Final    Comment: (NOTE) SARS-CoV-2 target nucleic acids are NOT DETECTED. The SARS-CoV-2 RNA is generally detectable in upper respiratoy specimens during the acute phase of infection. The lowest concentration of SARS-CoV-2 viral copies this assay can detect is 131 copies/mL. A negative result does not preclude SARS-Cov-2 infection and should not be used as the sole basis for treatment or other patient management decisions. A negative result  may occur with  improper specimen collection/handling, submission of specimen other than nasopharyngeal swab, presence of viral mutation(s) within the areas targeted by this assay, and inadequate number of viral copies (<131 copies/mL). A negative result must be combined with clinical observations, patient history, and epidemiological information. The expected result is Negative. Fact Sheet for Patients:    PinkCheek.be Fact Sheet for Healthcare Providers:  GravelBags.it This test is not yet ap proved or cleared by the [place] FDA and  has been authorized for detection and/or diagnosis of SARS-CoV-2 by FDA under an Emergency Use Authorization (EUA). This EUA will remain  in effect (meaning this test can be used) for the duration of the COVID-19 declaration under Section 564(b)(1) of the Act, 21 U.S.C. section 360bbb-3(b)(1), unless the authorization is terminated or revoked sooner.    Influenza A by PCR NEGATIVE NEGATIVE Final   Influenza B by PCR NEGATIVE NEGATIVE Final    Comment: (NOTE) The Xpert Xpress SARS-CoV-2/FLU/RSV assay is intended as an aid in  the diagnosis of influenza from Nasopharyngeal swab specimens and  should not be used as a sole basis for treatment. Nasal washings and  aspirates are unacceptable for Xpert Xpress SARS-CoV-2/FLU/RSV  testing. Fact Sheet for Patients: PinkCheek.be Fact Sheet for Healthcare Providers: GravelBags.it This test is not yet approved or cleared by the [place] FDA and  has been authorized for detection and/or diagnosis of SARS-CoV-2 by  FDA under an Emergency Use Authorization (EUA). This EUA will remain  in effect (meaning this test can be used) for the duration of the  Covid-19 declaration under Section 564(b)(1) of the Act, 21  U.S.C. section 360bbb-3(b)(1), unless the authorization is  terminated or revoked. Performed at [facility], [address], [place], [place] [address]   MRSA PCR Screening     Status: None   Collection Time: 03/13/19  5:18 PM   Specimen: Nasal Mucosa; Nasopharyngeal  Result Value Ref Range Status   MRSA by PCR NEGATIVE NEGATIVE Final    Comment:        The GeneXpert MRSA Assay (FDA approved for NASAL specimens only), is one component of a comprehensive MRSA  colonization surveillance program. It is not intended to diagnose MRSA infection nor to guide or monitor treatment for MRSA infections. Performed at [facility], [address] [place], [place] [address]   Surgical pcr screen     Status: None   Collection Time: 03/15/19  9:28 PM   Specimen: Nasal Mucosa; Nasal Swab  Result Value Ref Range Status   MRSA, PCR NEGATIVE NEGATIVE Final   Staphylococcus aureus NEGATIVE NEGATIVE Final    Comment: (NOTE) The Xpert SA Assay (FDA approved for NASAL specimens in patients 42 years of age and older), is one component of a comprehensive surveillance program. It is not intended to diagnose infection nor to guide or monitor treatment. Performed at [facility], [place] [address] [place], [place] [address]   Culture, respiratory (non-expectorated)     Status: None (Preliminary result)   Collection Time: 03/19/19  3:00 PM   Specimen: Tracheal Aspirate; Respiratory  Result Value Ref Range Status   Specimen Description TRACHEAL ASPIRATE  Final   Special Requests Normal  Final   Gram Stain   Final    ABUNDANT WBC PRESENT,BOTH PMN AND MONONUCLEAR FEW GRAM POSITIVE COCCI RARE GRAM NEGATIVE RODS    Culture   Final    CULTURE REINCUBATED FOR BETTER GROWTH Performed at [facility], [place] [address]  [place], [place] [address]    Report Status PENDING  Incomplete    Best Practice/Protocols:  VTE Prophylaxis: Lovenox (30mg  subcutaneous q24 hours)   Consults: Treatment Team:  Satira Sark, MD Prescott Gum, Collier Salina, MD   Objective:  Vital signs for last 24 hours: Temp:  [97.8 F (36.6 C)-98.9 F (37.2 C)] 98.2 F (36.8 C) (02/02 1141) Pulse Rate:  [40-153] 42 (02/02 1215) Resp:  [18-30] 20 (02/02 1145) BP: (78-155)/(49-123) 94/50 (02/02 1042) SpO2:  [91 %-100 %] 93 % (02/02 1215) Arterial Line BP: (66-268)/(38-263) 99/51 (02/02 1215) FiO2 (%):  [40 %-100 %] 50 % (02/02 1145) Weight:  [97.5 kg] 97.5 kg (02/02  0500)  Hemodynamic parameters for last 24 hours:    Intake/Output from previous day: 02/01 0701 - 02/02 0700 In: 1491.6 [I.V.:1391.7; IV Piggyback:99.9] Out: 1920 [Urine:1920]  Intake/Output this shift: Total I/O In: 216.3 [I.V.:196.3; NG/GT:20] Out: 200 [Urine:200]  Vent settings for last 24 hours: Vent Mode: PRVC FiO2 (%):  [40 %-100 %] 50 % Set Rate:  [18 bmp-20 bmp] 20 bmp Vt Set:  [550 mL] 550 mL PEEP:  [5 cmH20-8 cmH20] 8 cmH20 Plateau Pressure:  [9 LXB26-20 cmH20] 21 cmH20  Physical Exam:  General: intubated, minimally responsive Neuro: sedated, minimally responsive HEENT/Neck: JVD moderate to severe Resp: wheezing bilaterally, but crackles improved from yesterday CVS: IRR and tachycardic GI: soft, non-tender, mildly distended Skin: no rash Extremities: edematous UEs and LEs  Assessment/Plan:  Respiratory Distress: -most c/f pulm edema vs atelectasis given CXR, recent sternotomy, and increased resp effort.  Mild c/f pneumonia given purulent sputum and elevated WBC (13.9 today from 18.7 yesterday). Pt remains afebrile and no consolidation noted on CXR. Sputum culture shows few G+ cocci, rare G- rods. -cont ceftriaxone, follow culture results -intubated, plan to diurese further before attempting SBT -cont chest physiotherapy -cont xopenex q6 hours   Hypervolemic Hyponatremia: -Lasix 250mg  IV -metolazone 5mg  oral  Afib with RVR: -cont amiodarone 30mg /hr IV -cont milrinone, levophed  AKI: -BUN/Creatinine down-trending to 41 and 1.91 (34, 1.88 yesterday) -CTM for now  Hematuria/Dysuria:  -likely 2/2 cath, CTM; will consider UA tomorrow if hematuria persists  Anemia: -HGB 7.4 this morning, CTM closely  Critical Care Total Time*: [place], MS4 03/20/2019  *Care during the described time interval was provided by me and/or other providers on the critical care team.  I have reviewed this patient's available data, including medical history,  events of note, physical examination and test results as part of my evaluation.

## 2019-03-20 NOTE — Procedures (Signed)
Arterial Catheter Insertion Procedure Note THELMA VIANA [id number] 1929/01/27  Procedure: Insertion of Arterial Catheter  Indications: Blood pressure monitoring and Frequent blood sampling Unable to get reliable ABGs, need BP monitoring.  Procedure Details Consent: Unable to obtain consent because of emergent medical necessity. Time Out: Verified patient identification, verified procedure, site/side was marked, verified correct patient position, special equipment/implants available, medications/allergies/relevent history reviewed, required imaging and test results available.  Performed  Maximum sterile technique was used including antiseptics, cap, gloves, hand hygiene, mask and sheet. Skin prep: Chlorhexidine; local anesthetic administered 20 gauge catheter was inserted into left radial artery using the Seldinger technique. ULTRASOUND GUIDANCE USED: YES Evaluation  Unable to obtain arterial access. Needle inserted to artery x2 via [place] visualization, unable to thread wire.    Aborted procedure given urgent need for intubation.  [name] 03/20/2019

## 2019-03-20 NOTE — Progress Notes (Signed)
EVENING ROUNDS NOTE :     [address]       [place] [address]             [phone]                 4 Days Post-Op Procedure(s) (LRB): CORONARY ARTERY BYPASS GRAFTING (CABG), ON PUMP, TIMES THREE, USING LEFT INTERNAL MAMMARY ARTERY, RIGHT GREATER SAPHENOUS VEIN HARVESTED ENDOSCOPICALLY (N/A) MAZE (N/A) CLIPPING OF LEFT  ATRIAL APPENDAGE - USING ATRICLIP SIZE 40 (N/A) TRANSESOPHAGEAL ECHOCARDIOGRAM (TEE) (N/A)   Total Length of Stay:  LOS: 6 days  Events:   titrating levo Receiving blood     BP 94/76   Pulse (!) 112   Temp (P) 98.2 F (36.8 C)   Resp (P) 17   Ht 5\' 8"  (1.727 m)   Wt 97.5 kg   SpO2 99%   BMI 32.68 kg/m      Vent Mode: CPAP;PCV FiO2 (%):  [40 %-100 %] 50 % Set Rate:  [18 bmp-20 bmp] 20 bmp Vt Set:  [550 mL] 550 mL PEEP:  [5 cmH20-8 cmH20] 8 cmH20 Pressure Support:  [10 cmH20] 10 cmH20 Plateau Pressure:  [9 cmH20-21 cmH20] 21 cmH20  . sodium chloride    . sodium chloride    . sodium chloride 10 mL/hr (03/16/19 1710)  . amiodarone 30 mg/hr (03/20/19 1400)  . cefTRIAXone (ROCEPHIN)  IV Stopped (03/20/19 1142)  . dexmedetomidine (PRECEDEX) IV infusion 0.4 mcg/kg/hr (03/20/19 1400)  . feeding supplement (VITAL AF 1.2 CAL) 1,000 mL (03/20/19 1549)  . furosemide (LASIX) infusion 10 mg/hr (03/20/19 1400)  . lactated ringers 10 mL/hr at 03/20/19 1400  . milrinone 0.125 mcg/kg/min (03/20/19 1400)  . norepinephrine (LEVOPHED) Adult infusion 20 mcg/min (03/20/19 1400)    I/O last 3 completed shifts: In: 2280.5 [I.V.:2180.6; IV Piggyback:99.9] Out: 2020 [Urine:2020]   CBC Latest Ref Rng & Units 03/20/2019 03/20/2019 03/20/2019  WBC 4.0 - 10.5 K/uL 11.9(H) - -  Hemoglobin 13.0 - 17.0 g/dL 7.4(L) 6.1(LL) 9.2(L)  Hematocrit 39.0 - 52.0 % 23.6(L) 18.0(L) 27.0(L)  Platelets 150 - 400 K/uL 232 - -    BMP Latest Ref Rng & Units 03/20/2019 03/20/2019 03/20/2019  Glucose 70 - 99 mg/dL 202(H) - -  BUN 8 - 23 mg/dL 47(H) - -  Creatinine 0.61 -  1.24 mg/dL 2.02(H) - -  Sodium 135 - 145 mmol/L 136 141 134(L)  Potassium 3.5 - 5.1 mmol/L 4.2 3.0(L) 4.1  Chloride 98 - 111 mmol/L 100 - -  CO2 22 - 32 mmol/L 21(L) - -  Calcium 8.9 - 10.3 mg/dL 7.8(L) - -    ABG    Component Value Date/Time   PHART 7.400 03/20/2019 1139   PCO2ART 27.8 (L) 03/20/2019 1139   PO2ART 200.0 (H) 03/20/2019 1139   HCO3 17.3 (L) 03/20/2019 1139   TCO2 18 (L) 03/20/2019 1139   ACIDBASEDEF 7.0 (H) 03/20/2019 1139   O2SAT 100.0 03/20/2019 1139       Melodie Bouillon, MD 03/20/2019 5:46 PM

## 2019-03-20 NOTE — Progress Notes (Signed)
hgb on istat ABG 6.1 and potassium 3.0 lower than AM labs. No bleeding noted. Blood sent to lab to confirm values. MD was made aware.

## 2019-03-21 ENCOUNTER — Inpatient Hospital Stay (HOSPITAL_COMMUNITY): Payer: Medicare Other | Admitting: Anesthesiology

## 2019-03-21 ENCOUNTER — Inpatient Hospital Stay (HOSPITAL_COMMUNITY): Payer: Medicare Other

## 2019-03-21 LAB — CBC
HCT: 27.8 % — ABNORMAL LOW (ref 39.0–52.0)
Hemoglobin: 9 g/dL — ABNORMAL LOW (ref 13.0–17.0)
MCH: 29.5 pg (ref 26.0–34.0)
MCHC: 32.4 g/dL (ref 30.0–36.0)
MCV: 91.1 fL (ref 80.0–100.0)
Platelets: 265 10*3/uL (ref 150–400)
RBC: 3.05 MIL/uL — ABNORMAL LOW (ref 4.22–5.81)
RDW: 14.6 % (ref 11.5–15.5)
WBC: 13.4 10*3/uL — ABNORMAL HIGH (ref 4.0–10.5)
nRBC: 1.1 % — ABNORMAL HIGH (ref 0.0–0.2)

## 2019-03-21 LAB — TYPE AND SCREEN
ABO/RH(D): B POS
Antibody Screen: NEGATIVE
Unit division: 0

## 2019-03-21 LAB — COMPREHENSIVE METABOLIC PANEL
ALT: 56 U/L — ABNORMAL HIGH (ref 0–44)
AST: 59 U/L — ABNORMAL HIGH (ref 15–41)
Albumin: 2.3 g/dL — ABNORMAL LOW (ref 3.5–5.0)
Alkaline Phosphatase: 67 U/L (ref 38–126)
Anion gap: 12 (ref 5–15)
BUN: 50 mg/dL — ABNORMAL HIGH (ref 8–23)
CO2: 24 mmol/L (ref 22–32)
Calcium: 7.1 mg/dL — ABNORMAL LOW (ref 8.9–10.3)
Chloride: 96 mmol/L — ABNORMAL LOW (ref 98–111)
Creatinine, Ser: 1.78 mg/dL — ABNORMAL HIGH (ref 0.61–1.24)
GFR calc Af Amer: 39 mL/min — ABNORMAL LOW (ref >60)
GFR calc non Af Amer: 33 mL/min — ABNORMAL LOW (ref >60)
Glucose, Bld: 372 mg/dL — ABNORMAL HIGH (ref 70–99)
Potassium: 3.2 mmol/L — ABNORMAL LOW (ref 3.5–5.1)
Sodium: 132 mmol/L — ABNORMAL LOW (ref 135–145)
Total Bilirubin: 0.9 mg/dL (ref 0.3–1.2)
Total Protein: 5.4 g/dL — ABNORMAL LOW (ref 6.5–8.1)

## 2019-03-21 LAB — POCT I-STAT 7, (LYTES, BLD GAS, ICA,H+H)
Acid-Base Excess: 4 mmol/L — ABNORMAL HIGH (ref 0.0–2.0)
Acid-Base Excess: 2 mmol/L (ref 0.0–2.0)
Bicarbonate: 27.2 mmol/L (ref 20.0–28.0)
Bicarbonate: 24.6 mmol/L (ref 20.0–28.0)
Bicarbonate: 24.2 mmol/L (ref 20.0–28.0)
Calcium, Ion: 1.01 mmol/L — ABNORMAL LOW (ref 1.15–1.40)
Calcium, Ion: 1.02 mmol/L — ABNORMAL LOW (ref 1.15–1.40)
Calcium, Ion: 1.07 mmol/L — ABNORMAL LOW (ref 1.15–1.40)
HCT: 29 % — ABNORMAL LOW (ref 39.0–52.0)
HCT: 29 % — ABNORMAL LOW (ref 39.0–52.0)
HCT: 28 % — ABNORMAL LOW (ref 39.0–52.0)
Hemoglobin: 9.9 g/dL — ABNORMAL LOW (ref 13.0–17.0)
Hemoglobin: 9.9 g/dL — ABNORMAL LOW (ref 13.0–17.0)
Hemoglobin: 9.5 g/dL — ABNORMAL LOW (ref 13.0–17.0)
O2 Saturation: 96 %
O2 Saturation: 94 %
O2 Saturation: 92 %
Patient temperature: 98.1
Patient temperature: 97.7
Patient temperature: 97.9
Potassium: 3.6 mmol/L (ref 3.5–5.1)
Potassium: 3.6 mmol/L (ref 3.5–5.1)
Potassium: 3.2 mmol/L — ABNORMAL LOW (ref 3.5–5.1)
Sodium: 136 mmol/L (ref 135–145)
Sodium: 138 mmol/L (ref 135–145)
Sodium: 138 mmol/L (ref 135–145)
TCO2: 28 mmol/L (ref 22–32)
TCO2: 26 mmol/L (ref 22–32)
TCO2: 25 mmol/L (ref 22–32)
pCO2 arterial: 35.8 mmHg (ref 32.0–48.0)
pCO2 arterial: 31.8 mmHg — ABNORMAL LOW (ref 32.0–48.0)
pCO2 arterial: 34.1 mmHg (ref 32.0–48.0)
pH, Arterial: 7.488 — ABNORMAL HIGH (ref 7.350–7.450)
pH, Arterial: 7.495 — ABNORMAL HIGH (ref 7.350–7.450)
pH, Arterial: 7.457 — ABNORMAL HIGH (ref 7.350–7.450)
pO2, Arterial: 74 mmHg — ABNORMAL LOW (ref 83.0–108.0)
pO2, Arterial: 60 mmHg — ABNORMAL LOW (ref 83.0–108.0)
pO2, Arterial: 60 mmHg — ABNORMAL LOW (ref 83.0–108.0)

## 2019-03-21 LAB — BASIC METABOLIC PANEL
Anion gap: 12 (ref 5–15)
Anion gap: 14 (ref 5–15)
BUN: 53 mg/dL — ABNORMAL HIGH (ref 8–23)
BUN: 55 mg/dL — ABNORMAL HIGH (ref 8–23)
CO2: 26 mmol/L (ref 22–32)
CO2: 25 mmol/L (ref 22–32)
Calcium: 7.7 mg/dL — ABNORMAL LOW (ref 8.9–10.3)
Calcium: 7.6 mg/dL — ABNORMAL LOW (ref 8.9–10.3)
Chloride: 100 mmol/L (ref 98–111)
Chloride: 100 mmol/L (ref 98–111)
Creatinine, Ser: 1.83 mg/dL — ABNORMAL HIGH (ref 0.61–1.24)
Creatinine, Ser: 1.84 mg/dL — ABNORMAL HIGH (ref 0.61–1.24)
GFR calc Af Amer: 37 mL/min — ABNORMAL LOW (ref >60)
GFR calc Af Amer: 37 mL/min — ABNORMAL LOW (ref >60)
GFR calc non Af Amer: 32 mL/min — ABNORMAL LOW (ref >60)
GFR calc non Af Amer: 32 mL/min — ABNORMAL LOW (ref >60)
Glucose, Bld: 193 mg/dL — ABNORMAL HIGH (ref 70–99)
Glucose, Bld: 201 mg/dL — ABNORMAL HIGH (ref 70–99)
Potassium: 4 mmol/L (ref 3.5–5.1)
Potassium: 3.2 mmol/L — ABNORMAL LOW (ref 3.5–5.1)
Sodium: 138 mmol/L (ref 135–145)
Sodium: 139 mmol/L (ref 135–145)

## 2019-03-21 LAB — GLUCOSE, CAPILLARY
Glucose-Capillary: 144 mg/dL — ABNORMAL HIGH (ref 70–99)
Glucose-Capillary: 151 mg/dL — ABNORMAL HIGH (ref 70–99)
Glucose-Capillary: 152 mg/dL — ABNORMAL HIGH (ref 70–99)
Glucose-Capillary: 167 mg/dL — ABNORMAL HIGH (ref 70–99)
Glucose-Capillary: 169 mg/dL — ABNORMAL HIGH (ref 70–99)
Glucose-Capillary: 177 mg/dL — ABNORMAL HIGH (ref 70–99)
Glucose-Capillary: 183 mg/dL — ABNORMAL HIGH (ref 70–99)

## 2019-03-21 LAB — BPAM RBC
Blood Product Expiration Date: 202102282359
ISSUE DATE / TIME: 202102021611
Unit Type and Rh: 7300

## 2019-03-21 LAB — CULTURE, RESPIRATORY W GRAM STAIN
Culture: NORMAL
Special Requests: NORMAL

## 2019-03-21 LAB — COOXEMETRY PANEL
Carboxyhemoglobin: 0.7 % (ref 0.5–1.5)
Carboxyhemoglobin: 0.8 % (ref 0.5–1.5)
Carboxyhemoglobin: 1.2 % (ref 0.5–1.5)
Methemoglobin: 0.7 % (ref 0.0–1.5)
Methemoglobin: 0.9 % (ref 0.0–1.5)
Methemoglobin: 1.1 % (ref 0.0–1.5)
O2 Saturation: 51.8 %
O2 Saturation: 46.2 %
O2 Saturation: 52.6 %
Total hemoglobin: 10.7 g/dL — ABNORMAL LOW (ref 12.0–16.0)
Total hemoglobin: 9.9 g/dL — ABNORMAL LOW (ref 12.0–16.0)
Total hemoglobin: 9.4 g/dL — ABNORMAL LOW (ref 12.0–16.0)

## 2019-03-21 LAB — MAGNESIUM
Magnesium: 2.2 mg/dL (ref 1.7–2.4)
Magnesium: 2.4 mg/dL (ref 1.7–2.4)

## 2019-03-21 LAB — PHOSPHORUS
Phosphorus: 2.7 mg/dL (ref 2.5–4.6)
Phosphorus: 2.4 mg/dL — ABNORMAL LOW (ref 2.5–4.6)

## 2019-03-21 MED ORDER — METOPROLOL TARTRATE 25 MG PO TABS
25.0000 mg | ORAL_TABLET | Freq: Two times a day (BID) | ORAL | Status: DC
  Administered 2019-03-21 – 2019-03-24 (×7): 25 mg
  Filled 2019-03-21 (×8): qty 1

## 2019-03-21 MED ORDER — MILRINONE LACTATE IN DEXTROSE 20-5 MG/100ML-% IV SOLN
0.1250 ug/kg/min | INTRAVENOUS | Status: DC
  Administered 2019-03-21 – 2019-03-23 (×5): 0.25 ug/kg/min via INTRAVENOUS
  Administered 2019-03-24 – 2019-03-25 (×3): 0.125 ug/kg/min via INTRAVENOUS
  Filled 2019-03-21 (×6): qty 100

## 2019-03-21 MED ORDER — DILTIAZEM LOAD VIA INFUSION: 5.0000 mg | Freq: Once | INTRAVENOUS | Status: DC

## 2019-03-21 MED ORDER — NOREPINEPHRINE 16 MG/250ML-% IV SOLN
2.0000 ug/min | INTRAVENOUS | Status: DC
  Administered 2019-03-21: 18 ug/min via INTRAVENOUS
  Administered 2019-03-22: 10 ug/min via INTRAVENOUS
  Administered 2019-03-22: 16 ug/min via INTRAVENOUS
  Administered 2019-03-23: 13:00:00 17 ug/min via INTRAVENOUS
  Administered 2019-03-24: 20 ug/min via INTRAVENOUS
  Administered 2019-03-25: 04:00:00 7 ug/min via INTRAVENOUS
  Filled 2019-03-21 (×5): qty 250

## 2019-03-21 MED ORDER — POTASSIUM CHLORIDE 20 MEQ/15ML (10%) PO SOLN
20.0000 meq | ORAL | Status: AC
  Administered 2019-03-21 (×3): 20 meq
  Filled 2019-03-21 (×3): qty 15

## 2019-03-21 MED ORDER — POTASSIUM CHLORIDE 20 MEQ/15ML (10%) PO SOLN
20.0000 meq | ORAL | Status: AC
  Administered 2019-03-21 – 2019-03-22 (×3): 20 meq
  Filled 2019-03-21 (×3): qty 15

## 2019-03-21 MED ORDER — PANTOPRAZOLE SODIUM 40 MG IV SOLR
40.0000 mg | INTRAVENOUS | Status: DC
  Administered 2019-03-21 – 2019-03-24 (×4): 40 mg via INTRAVENOUS
  Filled 2019-03-21 (×4): qty 40

## 2019-03-21 MED ORDER — DILTIAZEM HCL-DEXTROSE 125-5 MG/125ML-% IV SOLN (PREMIX)
5.0000 mg/h | INTRAVENOUS | Status: AC
  Administered 2019-03-22: 17:00:00 10 mg/h via INTRAVENOUS
  Filled 2019-03-21 (×3): qty 125

## 2019-03-21 NOTE — Progress Notes (Signed)
Daily Progress Note  Patient Details:    Jesse Murphy is an 86 y.o. male with a h/o CAD, afib, s/p CABG and clipping of left atrial appendage p/w shortness of breath on 2/1.  Patient became hypoxic and needed to be intubated on 2/2.     PMHx: CAD, afib  Significant Hospital Events:  1/29 CABG + clipping left atrial appendage 2/1 transfer to CVICU 2/2 SOB 2/2 intubated  Significant Diagnostic Tests:  2/3 CXR shows mild improvement in pulm effusion, edema 2/2 CXR shows pulm edema 2/1 CXR shows likely pulm edema 1/29 Intraoperative Echo, EF of 45% 1/26 Preop Echo, EF of 30-35%  Micro Data: 2/1 sputum sent, culture consistent w/ normal resp flora 1/26 MRSA PCR screening negative 1/26 Covid negative  Antimicrobials: Ceftriaxone 2/1 -   Interim Hx/ Subjective: Decreased precedex but patient became agitated and hypoxemic.  Increased to prior dose and patient settled.  Lines, Airways, Drains: Introducer Double Lumen 03/16/19 Internal Jugular Right (Active)  Site Assessment Clean;Dry;Intact 03/19/19 0800  Proximal Lumen Status Capped 03/19/19 0800  Distal Lumen Status Infusing 03/19/19 0800  Dressing Type Transparent;Occlusive 03/19/19 0800  Dressing Status Clean;Dry;Intact;Antimicrobial disc in place 03/19/19 0800  Dressing Interventions Dressing changed 03/19/19 0800  Dressing Change Due 03/26/19 03/19/19 0800     External Urinary Catheter (Active)  Collection Container Standard drainage bag 03/19/19 0800  Securement Method Securing device (Describe) 03/19/19 0800  Site Assessment Clean;Intact;Dry 03/19/19 0800  Output (mL) 0 mL 03/19/19 1500    Anti-infectives:  Anti-infectives (From admission, onward)   Start     Dose/Rate Route Frequency Ordered Stop   03/19/19 1000  cefTRIAXone (ROCEPHIN) 1 g in sodium chloride 0.9 % 100 mL IVPB     1 g 200 mL/hr over 30 Minutes Intravenous Daily 03/19/19 0810     03/16/19 2230  vancomycin (VANCOCIN) IVPB 1000 mg/200 mL premix      1,000 mg 200 mL/hr over 60 Minutes Intravenous  Once 03/16/19 1721 03/17/19 0024   03/16/19 1830  cefUROXime (ZINACEF) 1.5 g in sodium chloride 0.9 % 100 mL IVPB     1.5 g 200 mL/hr over 30 Minutes Intravenous Every 12 hours 03/16/19 1721 03/18/19 0557   03/16/19 0400  vancomycin (VANCOREADY) IVPB 1500 mg/300 mL  Status:  Discontinued     1,500 mg 150 mL/hr over 120 Minutes Intravenous To Surgery 03/15/19 1425 03/16/19 1715   03/16/19 0400  cefUROXime (ZINACEF) 1.5 g in sodium chloride 0.9 % 100 mL IVPB  Status:  Discontinued     1.5 g 200 mL/hr over 30 Minutes Intravenous To Surgery 03/15/19 1425 03/16/19 1715   03/16/19 0400  cefUROXime (ZINACEF) 750 mg in sodium chloride 0.9 % 100 mL IVPB     750 mg 200 mL/hr over 30 Minutes Intravenous To Surgery 03/15/19 1425 03/16/19 1540      Microbiology: Results for orders placed or performed during the hospital encounter of 03/13/19  Respiratory Panel by RT PCR (Flu A&B, Covid) - Nasopharyngeal Swab     Status: None   Collection Time: 03/13/19  1:49 PM   Specimen: Nasopharyngeal Swab  Result Value Ref Range Status   SARS Coronavirus 2 by RT PCR NEGATIVE NEGATIVE Final    Comment: (NOTE) SARS-CoV-2 target nucleic acids are NOT DETECTED. The SARS-CoV-2 RNA is generally detectable in upper respiratoy specimens during the acute phase of infection. The lowest concentration of SARS-CoV-2 viral copies this assay can detect is 131 copies/mL. A negative result does not preclude SARS-Cov-2  infection and should not be used as the sole basis for treatment or other patient management decisions. A negative result may occur with  improper specimen collection/handling, submission of specimen other than nasopharyngeal swab, presence of viral mutation(s) within the areas targeted by this assay, and inadequate number of viral copies (<131 copies/mL). A negative result must be combined with clinical observations, patient history, and epidemiological  information. The expected result is Negative. Fact Sheet for Patients:  PinkCheek.be Fact Sheet for Healthcare Providers:  GravelBags.it This test is not yet ap proved or cleared by the [place] FDA and  has been authorized for detection and/or diagnosis of SARS-CoV-2 by FDA under an Emergency Use Authorization (EUA). This EUA will remain  in effect (meaning this test can be used) for the duration of the COVID-19 declaration under Section 564(b)(1) of the Act, 21 U.S.C. section 360bbb-3(b)(1), unless the authorization is terminated or revoked sooner.    Influenza A by PCR NEGATIVE NEGATIVE Final   Influenza B by PCR NEGATIVE NEGATIVE Final    Comment: (NOTE) The Xpert Xpress SARS-CoV-2/FLU/RSV assay is intended as an aid in  the diagnosis of influenza from Nasopharyngeal swab specimens and  should not be used as a sole basis for treatment. Nasal washings and  aspirates are unacceptable for Xpert Xpress SARS-CoV-2/FLU/RSV  testing. Fact Sheet for Patients: PinkCheek.be Fact Sheet for Healthcare Providers: GravelBags.it This test is not yet approved or cleared by the [place] FDA and  has been authorized for detection and/or diagnosis of SARS-CoV-2 by  FDA under an Emergency Use Authorization (EUA). This EUA will remain  in effect (meaning this test can be used) for the duration of the  Covid-19 declaration under Section 564(b)(1) of the Act, 21  U.S.C. section 360bbb-3(b)(1), unless the authorization is  terminated or revoked. Performed at [facility], [address] [place], [place] [address]   MRSA PCR Screening     Status: None   Collection Time: 03/13/19  5:18 PM   Specimen: Nasal Mucosa; Nasopharyngeal  Result Value Ref Range Status   MRSA by PCR NEGATIVE NEGATIVE Final    Comment:        The GeneXpert MRSA Assay (FDA approved for NASAL  specimens only), is one component of a comprehensive MRSA colonization surveillance program. It is not intended to diagnose MRSA infection nor to guide or monitor treatment for MRSA infections. Performed at [facility], [address], [place], [place] [address]   Surgical pcr screen     Status: None   Collection Time: 03/15/19  9:28 PM   Specimen: Nasal Mucosa; Nasal Swab  Result Value Ref Range Status   MRSA, PCR NEGATIVE NEGATIVE Final   Staphylococcus aureus NEGATIVE NEGATIVE Final    Comment: (NOTE) The Xpert SA Assay (FDA approved for NASAL specimens in patients 4 years of age and older), is one component of a comprehensive surveillance program. It is not intended to diagnose infection nor to guide or monitor treatment. Performed at [facility], [place] [address], [place], [place] [address]   Culture, respiratory (non-expectorated)     Status: None   Collection Time: 03/19/19  3:00 PM   Specimen: Tracheal Aspirate; Respiratory  Result Value Ref Range Status   Specimen Description TRACHEAL ASPIRATE  Final   Special Requests Normal  Final   Gram Stain   Final    ABUNDANT WBC PRESENT,BOTH PMN AND MONONUCLEAR FEW GRAM POSITIVE COCCI RARE GRAM NEGATIVE RODS    Culture   Final  FEW Consistent with normal respiratory flora. Performed at [facility], [place] [address], [place], [place] [address]    Report Status 03/21/2019 FINAL  Final    Best Practice/Protocols:  VTE Prophylaxis: Lovenox (30mg  subcutaneous q24 hours)   Consults: Treatment Team:  Satira Sark, MD Prescott Gum, Collier Salina, MD   Objective:  Vital signs for last 24 hours: Temp:  [97.6 F (36.4 C)-98.3 F (36.8 C)] 97.6 F (36.4 C) (02/03 1136) Pulse Rate:  [35-125] 113 (02/03 1109) Resp:  [14-26] 14 (02/03 1109) BP: (94-121)/(40-76) 101/40 (02/03 1109) SpO2:  [83 %-100 %] 96 % (02/03 1109) Arterial Line BP: (84-241)/(44-77) 112/55 (02/03 0700) FiO2 (%):  [50 %-80 %] 50 % (02/03  1109) Weight:  [97.7 kg] 97.7 kg (02/03 0241)  Hemodynamic parameters for last 24 hours: CVP:  [11 mmHg-30 mmHg] 12 mmHg  Intake/Output from previous day: 02/02 0701 - 02/03 0700 In: 3144.1 [I.V.:2487.5; Blood:313; NG/GT:243.7; IV Piggyback:100] Out: 4085 [Urine:4035; Emesis/NG output:50]  Intake/Output this shift: Total I/O In: 602.8 [I.V.:302.8; NG/GT:300] Out: 775 [Urine:675; Emesis/NG output:100]  Vent settings for last 24 hours: Vent Mode: CPAP;PSV FiO2 (%):  [50 %-80 %] 50 % Set Rate:  [20 bmp] 20 bmp Vt Set:  [550 mL] 550 mL PEEP:  [8 cmH20] 8 cmH20 Pressure Support:  [10 cmH20] 10 cmH20 Plateau Pressure:  [17 cmH20-18 cmH20] 18 cmH20  Physical Exam:  General: intubated, minimally responsive Neuro: sedated, minimally responsive HEENT/Neck: JVD moderate to severe Resp: wheezing bilaterally, but crackles improved from yesterday CVS: IRR and tachycardic GI: soft, non-tender, mildly distended Skin: no rash Extremities: edematous UEs and LEs  Assessment/Plan:  Hypercarbic Hypoxic Respiratory Failure: Likely 2/2 cardiogenic pulmonary edema.  Resp culture consistent with normal respiratory flora.  Pt remains afebrile.  Mild improvement on CXR 2/3.   -cont ceftriaxone  -intubated, plan to diurese further before attempting SBT (goal: additional 1.5-2 L) -cont chest physiotherapy -cont xopenex q6 hours   Hypervolemic: CVP 16/17 on 2/3 from 19/20 on 2/2. -Lasix 250mg  IV -metolazone 5mg  oral  Afib with RVR: -cont amiodarone 30mg /hr IV -cont milrinone, levophed -metoprolol 25mg  bid  AKI: -BUN/Creatinine 53 and 1.83 -CTM for now  Anemia: -HGB 7.4 this morning, CTM closely  Hematuria/Dysuria:  -resolved  Critical Care Total Time*: [place], MS4 03/21/2019  *Care during the described time interval was provided by me and/or other providers on the critical care team.  I have reviewed this patient's available data, including medical history,  events of note, physical examination and test results as part of my evaluation.

## 2019-03-21 NOTE — Progress Notes (Addendum)
TCTS DAILY ICU PROGRESS NOTE                   [address]            ,[place] [address]          [phone]   5 Days Post-Op Procedure(s) (LRB): CORONARY ARTERY BYPASS GRAFTING (CABG), ON PUMP, TIMES THREE, USING LEFT INTERNAL MAMMARY ARTERY, RIGHT GREATER SAPHENOUS VEIN HARVESTED ENDOSCOPICALLY (N/A) MAZE (N/A) CLIPPING OF LEFT  ATRIAL APPENDAGE - USING ATRICLIP SIZE 40 (N/A) TRANSESOPHAGEAL ECHOCARDIOGRAM (TEE) (N/A)  Total Length of Stay:  LOS: 7 days   Subjective: Remains sedated on vent but responds to verbal stimuli  Objective: Vital signs in last 24 hours: Temp:  [97.7 F (36.5 C)-98.3 F (36.8 C)] 98.2 F (36.8 C) (02/03 0326) Pulse Rate:  [35-125] 118 (02/03 0706) Cardiac Rhythm: Atrial fibrillation (02/03 0400) Resp:  [17-26] 18 (02/03 0706) BP: (78-121)/(50-76) 121/55 (02/03 0706) SpO2:  [79 %-100 %] 97 % (02/03 0706) Arterial Line BP: (66-268)/(38-263) 112/55 (02/03 0700) FiO2 (%):  [50 %-100 %] 50 % (02/03 0706) Weight:  [97.7 kg] 97.7 kg (02/03 0241)  Filed Weights   03/19/19 0500 03/20/19 0500 03/21/19 0241  Weight: 95.9 kg 97.5 kg 97.7 kg    Weight change: 0.2 kg   Hemodynamic parameters for last 24 hours: CVP:  [11 mmHg-30 mmHg] 12 mmHg  Intake/Output from previous day: 02/02 0701 - 02/03 0700 In: 3142.3 [I.V.:2485.6; Blood:313; NG/GT:243.7; IV Piggyback:100] Out: 0626 [Urine:3810; Emesis/NG output:50]  Intake/Output this shift: No intake/output data recorded.  Current Meds: Scheduled Meds: . acetaminophen  1,000 mg Oral Q6H   Or  . acetaminophen (TYLENOL) oral liquid 160 mg/5 mL  1,000 mg Per Tube Q6H  . aspirin EC  325 mg Oral Daily   Or  . aspirin  324 mg Per Tube Daily  . atorvastatin  80 mg Oral q1800  . bisacodyl  10 mg Oral Daily   Or  . bisacodyl  10 mg Rectal Daily  . chlorhexidine gluconate (MEDLINE KIT)  15 mL Mouth Rinse BID  . Chlorhexidine Gluconate Cloth  6 each Topical Daily  . docusate  200 mg Oral  Daily  . enoxaparin (LOVENOX) injection  30 mg Subcutaneous Q24H  . feeding supplement (PRO-STAT SUGAR FREE 64)  30 mL Per Tube TID  . insulin aspart  0-24 Units Subcutaneous Q4H  . levalbuterol  1.25 mg Nebulization Q6H  . lidocaine  5 mL Intradermal Once  . mouth rinse  15 mL Mouth Rinse BID  . mouth rinse  15 mL Mouth Rinse 10 times per day  . metoCLOPramide (REGLAN) injection  10 mg Intravenous Q6H  . metolazone  10 mg Oral BID  . potassium chloride  20 mEq Per Tube Q4H  . sodium chloride flush  10-40 mL Intracatheter Q12H  . sodium chloride flush  3 mL Intravenous Q12H   Continuous Infusions: . sodium chloride    . sodium chloride    . sodium chloride 10 mL/hr (03/16/19 1710)  . amiodarone 30 mg/hr (03/21/19 0659)  . cefTRIAXone (ROCEPHIN)  IV Stopped (03/20/19 1142)  . dexmedetomidine (PRECEDEX) IV infusion 0.4 mcg/kg/hr (03/21/19 0659)  . feeding supplement (VITAL AF 1.2 CAL) 30 mL/hr at 03/21/19 0100  . furosemide (LASIX) infusion 10 mg/hr (03/21/19 0659)  . lactated ringers 10 mL/hr at 03/21/19 0659  . milrinone 0.125 mcg/kg/min (03/21/19 0659)  . norepinephrine (LEVOPHED) Adult infusion 17 mcg/min (03/21/19 0659)   PRN Meds:.sodium  chloride, dextrose, fentaNYL (SUBLIMAZE) injection, ondansetron (ZOFRAN) IV, oxyCODONE, sodium chloride flush, sodium chloride flush, sodium chloride flush  General appearance: no distress and sedated Neurologic: grossly intact  Heart: irregularly irregular rhythm and tachy Lungs: dim left>right lower fields Abdomen: some distension, + BS, no guarding or rebound Extremities: edema may be a little better Wound: + sternal drainage, bloody    Lab Results: CBC: Recent Labs    03/20/19 1925 03/20/19 1925 03/21/19 0208 03/21/19 0400  WBC 12.4*  --  13.4*  --   HGB 8.6*   < > 9.0* 9.9*  HCT 26.9*   < > 27.8* 29.0*  PLT 243  --  265  --    < > = values in this interval not displayed.   BMET:  Recent Labs    03/20/19 1724  03/20/19 1745 03/21/19 0208 03/21/19 0400  NA 136   < > 132* 136  K 3.6   < > 3.2* 3.6  CL 99  --  96*  --   CO2 22  --  24  --   GLUCOSE 165*  --  372*  --   BUN 48*  --  50*  --   CREATININE 1.97*  --  1.78*  --   CALCIUM 7.5*  --  7.1*  --    < > = values in this interval not displayed.    CMET: Lab Results  Component Value Date   WBC 13.4 (H) 03/21/2019   HGB 9.9 (L) 03/21/2019   HCT 29.0 (L) 03/21/2019   PLT 265 03/21/2019   GLUCOSE 372 (H) 03/21/2019   CHOL 117 03/16/2019   TRIG 52 03/16/2019   HDL 33 (L) 03/16/2019   LDLCALC 74 03/16/2019   ALT 56 (H) 03/21/2019   AST 59 (H) 03/21/2019   NA 136 03/21/2019   K 3.6 03/21/2019   CL 96 (L) 03/21/2019   CREATININE 1.78 (H) 03/21/2019   BUN 50 (H) 03/21/2019   CO2 24 03/21/2019   TSH 3.683 03/13/2019   INR 1.6 (H) 03/16/2019   HGBA1C 6.1 (H) 03/13/2019      PT/INR: No results for input(s): LABPROT, INR in the last 72 hours. Radiology: DG CHEST PORT 1 VIEW  Result Date: 03/20/2019 CLINICAL DATA:  Status post CABG EXAM: PORTABLE CHEST 1 VIEW COMPARISON:  Same-day radiograph FINDINGS: Endotracheal tube terminates 3.4 cm superior to the carina. Enteric tube courses below the diaphragm with distal tip terminating beyond the inferior margin of the film. Interval placement of a right-sided PICC line with distal tip terminating at the level of the distal SVC. Right IJ sheath is unchanged. Postsurgical changes from CABG. Stable heart size. Small bilateral pleural effusions with associated bibasilar atelectasis, stable from prior. No pneumothorax. IMPRESSION: 1. Interval placement of a right-sided PICC line with distal tip terminating at the level of the distal SVC. 2. No pneumothorax. 3. Otherwise stable exam. Electronically Signed   By: Davina Poke D.O.   On: 03/20/2019 15:57   DG Abd Portable 1V  Result Date: 03/20/2019 CLINICAL DATA:  Encounter for feeding tube placement EXAM: PORTABLE ABDOMEN - 1 VIEW 12:41 p.m.  COMPARISON:  Radiograph dated 03/20/2019 at 10:56 a.m. FINDINGS: Feeding tube has been inserted. The tip is either looped back into the antrum of the stomach or in the third portion of the duodenum. NG tube tip is in the fundus of the stomach. Bowel gas pattern is normal. Persistent left pleural effusion. IMPRESSION: Feeding tube tip is either in the antrum  of the stomach or in the third portion of the duodenum. Electronically Signed   By: Lorriane Shire M.D.   On: 03/20/2019 12:50   DG Abd Portable 1V  Result Date: 03/20/2019 CLINICAL DATA:  Abdominal pain. EXAM: PORTABLE ABDOMEN - 1 VIEW COMPARISON:  None. FINDINGS: OG tube is in place with the tip and side-port in the stomach. Bowel gas pattern is normal. No abnormal abdominal calcification. Multiple leads and wires overlie the upper abdomen. Rectal temperature probe noted. No unexpected abdominal calcification. No acute or focal bony abnormality. IMPRESSION: No acute abnormality. NG tube in good position. Electronically Signed   By: Inge Rise M.D.   On: 03/20/2019 11:09   ECHOCARDIOGRAM LIMITED  Result Date: 03/20/2019   ECHOCARDIOGRAM LIMITED REPORT   Patient Name:   Jesse Murphy Date of Exam: 03/20/2019 Medical Rec #:  [id number]    Height:       68.0 in Accession #:    [id number]   Weight:       214.9 lb Date of Birth:  1928-07-25   BSA:          2.11 m Patient Age:    86 years     BP:           94/50 mmHg Patient Gender: M            HR:           103 bpm. Exam Location:  Inpatient  Procedure: Limited Echo, Color Doppler and Cardiac Doppler Indications:    Cardiomyopathy-Ischemic 414.8 / I25.5  History:        Patient has prior history of Echocardiogram examinations, most                 recent 03/13/2019.  Sonographer:    Vikki Ports Turrentine Referring Phys: [place] Comments: Unable to administer definity due to IV difficulty. IMPRESSIONS  1. Left ventricular ejection fraction, by visual estimation, is 25 to 30%. The left  ventricle has severely decreased function. There is no increased left ventricular wall thickness.  2. Abnormal septal motion consistent with left bundle branch block.  3. The left ventricle demonstrates global hypokinesis. Images inadequate for focal wall motion abnormalities.  4. Left ventricular diastolic parameters are indeterminate due to atrial fibrillation.  5. Global right ventricle has normal systolc function.The right ventricular size is normal. no increase in right ventricular wall thickness.  6. Mild to moderate mitral annular calcification. No evidence of mitral valve regurgitation. No evidence of mitral stenosis.  7. The tricuspid valve was normal in structure. Tricuspid valve regurgitation is not demonstrated.  8. There is heavy focal calcification of the right coronary cusp. The mean AV gradient is 33mHg which may be underestimated in the setting of severe LV dysfunction.  9. The pulmonic valve was normal in structure. 10. The inferior vena cava is normal in size with greater than 50% respiratory variability, suggesting right atrial pressure of 3 mmHg. 11. Small pericardial effusion. 12. The pericardial effusion is posterior to the left ventricle. FINDINGS  Left Ventricle: Left ventricular ejection fraction, by visual estimation, is 25 to 30%. The left ventricle has severely decreased function. The left ventricle demonstrates global hypokinesis. There is no increased left ventricular wall thickness. Abnormal (paradoxical) septal motion, consistent with left bundle branch block. The left ventricular diastology could not be evaluated due to atrial fibrillation. Left ventricular diastolic parameters are indeterminate. Right Ventricle: The right ventricular size is normal. No increase in right  ventricular wall thickness. Global RV systolic function is has normal systolic function. Left Atrium: Left atrial size was normal in size. Right Atrium: Right atrial size was normal in size. Pericardium: A small  pericardial effusion is present is seen. A small pericardial effusion is present. The pericardial effusion is posterior to the left ventricle. Mitral Valve: The mitral valve is normal in structure. Mild to moderate mitral annular calcification. No evidence of mitral valve stenosis by observation. No evidence of mitral valve regurgitation. Tricuspid Valve: The tricuspid valve is normal in structure. Tricuspid valve regurgitation is not demonstrated. Aortic Valve: The aortic valve is tricuspid. There is heavy focal calcification of the right coronary There is. Aortic valve regurgitation is not visualized. Mild aortic valve sclerosis is present, with no evidence of aortic valve stenosis. Aortic valve mean gradient measures 5.0 mmHg. Aortic valve peak gradient measures 8.2 mmHg. Aortic valve area, by VTI measures 1.28 cm.  Pulmonic Valve: The pulmonic valve was normal in structure. Pulmonic valve regurgitation is not visualized by color flow Doppler. Pulmonic regurgitation is not visualized by color flow Doppler. Aorta: The aortic root, ascending aorta and aortic arch are all structurally normal, with no evidence of dilitation or obstruction. Venous: The inferior vena cava is normal in size with greater than 50% respiratory variability, suggesting right atrial pressure of 3 mmHg. Shunts: There is no evidence of a patent foramen ovale. No ventricular septal defect is seen or detected. There is no evidence of an atrial septal defect. No atrial level shunt detected by color flow Doppler.  LEFT VENTRICLE          Normals PLAX 2D LVOT diam:     1.80 cm  2.0 cm LVOT Area:     2.54 cm 3.14 cm2  AORTIC VALVE                    Normals AV Area (Vmax):    1.57 cm AV Area (Vmean):   1.32 cm     3.06 cm2 AV Area (VTI):     1.28 cm AV Vmax:           143.00 cm/s AV Vmean:          102.000 cm/s 77 cm/s AV VTI:            0.213 m      3.15 cm2 AV Peak Grad:      8.2 mmHg AV Mean Grad:      5.0 mmHg     3 mmHg LVOT Vmax:          88.30 cm/s LVOT Vmean:        52.900 cm/s  75 cm/s LVOT VTI:          0.107 m      25.3 cm LVOT/AV VTI ratio: 0.50         1  SHUNTS Systemic VTI:  0.11 m Systemic Diam: 1.80 cm  Fransico Him MD Electronically signed by Fransico Him MD Signature Date/Time: 03/20/2019/2:42:12 PMThe mitral valve is normal in structure.    Final    Results for orders placed or performed during the hospital encounter of 03/13/19  Respiratory Panel by RT PCR (Flu A&B, Covid) - Nasopharyngeal Swab     Status: None   Collection Time: 03/13/19  1:49 PM   Specimen: Nasopharyngeal Swab  Result Value Ref Range Status   SARS Coronavirus 2 by RT PCR NEGATIVE NEGATIVE Final    Comment: (NOTE) SARS-CoV-2 target nucleic acids are NOT  DETECTED. The SARS-CoV-2 RNA is generally detectable in upper respiratoy specimens during the acute phase of infection. The lowest concentration of SARS-CoV-2 viral copies this assay can detect is 131 copies/mL. A negative result does not preclude SARS-Cov-2 infection and should not be used as the sole basis for treatment or other patient management decisions. A negative result may occur with  improper specimen collection/handling, submission of specimen other than nasopharyngeal swab, presence of viral mutation(s) within the areas targeted by this assay, and inadequate number of viral copies (<131 copies/mL). A negative result must be combined with clinical observations, patient history, and epidemiological information. The expected result is Negative. Fact Sheet for Patients:  PinkCheek.be Fact Sheet for Healthcare Providers:  GravelBags.it This test is not yet ap proved or cleared by the [place] FDA and  has been authorized for detection and/or diagnosis of SARS-CoV-2 by FDA under an Emergency Use Authorization (EUA). This EUA will remain  in effect (meaning this test can be used) for the duration of the COVID-19 declaration  under Section 564(b)(1) of the Act, 21 U.S.C. section 360bbb-3(b)(1), unless the authorization is terminated or revoked sooner.    Influenza A by PCR NEGATIVE NEGATIVE Final   Influenza B by PCR NEGATIVE NEGATIVE Final    Comment: (NOTE) The Xpert Xpress SARS-CoV-2/FLU/RSV assay is intended as an aid in  the diagnosis of influenza from Nasopharyngeal swab specimens and  should not be used as a sole basis for treatment. Nasal washings and  aspirates are unacceptable for Xpert Xpress SARS-CoV-2/FLU/RSV  testing. Fact Sheet for Patients: PinkCheek.be Fact Sheet for Healthcare Providers: GravelBags.it This test is not yet approved or cleared by the [place] FDA and  has been authorized for detection and/or diagnosis of SARS-CoV-2 by  FDA under an Emergency Use Authorization (EUA). This EUA will remain  in effect (meaning this test can be used) for the duration of the  Covid-19 declaration under Section 564(b)(1) of the Act, 21  U.S.C. section 360bbb-3(b)(1), unless the authorization is  terminated or revoked. Performed at [facility], [address] [place], [place] [address]   MRSA PCR Screening     Status: None   Collection Time: 03/13/19  5:18 PM   Specimen: Nasal Mucosa; Nasopharyngeal  Result Value Ref Range Status   MRSA by PCR NEGATIVE NEGATIVE Final    Comment:        The GeneXpert MRSA Assay (FDA approved for NASAL specimens only), is one component of a comprehensive MRSA colonization surveillance program. It is not intended to diagnose MRSA infection nor to guide or monitor treatment for MRSA infections. Performed at [facility], [address] [place], [place] [address]   Surgical pcr screen     Status: None   Collection Time: 03/15/19  9:28 PM   Specimen: Nasal Mucosa; Nasal Swab  Result Value Ref Range Status   MRSA, PCR NEGATIVE NEGATIVE Final   Staphylococcus aureus NEGATIVE NEGATIVE Final     Comment: (NOTE) The Xpert SA Assay (FDA approved for NASAL specimens in patients 84 years of age and older), is one component of a comprehensive surveillance program. It is not intended to diagnose infection nor to guide or monitor treatment. Performed at [facility], [place] [address], [place], [place] [address]   Culture, respiratory (non-expectorated)     Status: None (Preliminary result)   Collection Time: 03/19/19  3:00 PM   Specimen: Tracheal Aspirate; Respiratory  Result Value Ref Range Status   Specimen Description TRACHEAL ASPIRATE  Final   Special Requests Normal  Final   Gram Stain   Final    ABUNDANT WBC PRESENT,BOTH PMN AND MONONUCLEAR FEW GRAM POSITIVE COCCI RARE GRAM NEGATIVE RODS    Culture   Final    CULTURE REINCUBATED FOR BETTER GROWTH Performed at [facility], [place] [address], [place], [place] [address]    Report Status PENDING  Incomplete    Assessment/Plan: S/P Procedure(s) (LRB): CORONARY ARTERY BYPASS GRAFTING (CABG), ON PUMP, TIMES THREE, USING LEFT INTERNAL MAMMARY ARTERY, RIGHT GREATER SAPHENOUS VEIN HARVESTED ENDOSCOPICALLY (N/A) MAZE (N/A) CLIPPING OF LEFT  ATRIAL APPENDAGE - USING ATRICLIP SIZE 40 (N/A) TRANSESOPHAGEAL ECHOCARDIOGRAM (TEE) (N/A)  1 Critical but some improvement 2 remains on amio gtt, beta blocker for afib with RVR. Hemodynamics o/W pretty stable on milrinone and levo, CoOx is 51.8 - wean levo off as able 3 ABG now showing resp alkalosis, vent management as per PCCM, pressures and volumes are good, no auto peep 4 volume overload- now diuresing well on lasix gtt, mild hyponatremia/hypochloremia- monitor closely, almost 2 liters out so far today, CVP improving (16), creat improving, BUN pretty stable(1.78//50) 5 LFT's slightly elevated- prob passive congestion- monitor  6 cont TF's - albumin/protein low 7 ABL anemia- pretty stable 8 BS control pretty good on SSI 9 slight increase WBC- no fevers, rocephin for pulm coverage-  sputum cx neg so far 10 monitor sternal incis closely with + drainage 11 ?Lovenox for DVT ppx, has SCD's John Giovanni 03/21/2019 7:49 AM    Acute  renal failure creat 1.8 bun 50 Acute postop respiratory failure/pulmonary edema  req reintubation preop ischemic cardiomyopathy with low cardiac output Postop echo personally reviewed- LVEF improved 30%- mild MR TR Much improved after diuresis with lasix drip, CXR clearing afib 110/min on iv amio and low dose lovenox Co-ox reduced-.50 - will increase milrinone back to .25 and leave norepi  Patient condition d/w family including his need for re-intubation and plan of care patient examined and medical record reviewed,agree with above note. Tharon Aquas Trigt III 03/21/2019

## 2019-03-21 NOTE — Progress Notes (Signed)
TCTS BRIEF SICU PROGRESS NOTE  5 Days Post-Op  S/P Procedure(s) (LRB): CORONARY ARTERY BYPASS GRAFTING (CABG), ON PUMP, TIMES THREE, USING LEFT INTERNAL MAMMARY ARTERY, RIGHT GREATER SAPHENOUS VEIN HARVESTED ENDOSCOPICALLY (N/A) MAZE (N/A) CLIPPING OF LEFT  ATRIAL APPENDAGE - USING ATRICLIP SIZE 40 (N/A) TRANSESOPHAGEAL ECHOCARDIOGRAM (TEE) (N/A)   Stable day Afib w/ HR 110-120 and BP stable on levophed and milrinone, diltiazem added for HR control O2 sats 94% on 50% FiO2 on vent Excellent UOP  Plan: Continue current plan  Rexene Alberts, MD 03/21/2019 6:15 PM

## 2019-03-21 NOTE — Anesthesia Procedure Notes (Signed)
Arterial Line Insertion Start/End2/04/2019 12:20 PM, 03/21/2019 12:30 PM Performed by: Murvin Natal, MD, Josephine Igo, CRNA, anesthesiologist  Patient location: ICU. Preanesthetic checklist: patient identified, IV checked, site marked, risks and benefits discussed, surgical consent, monitors and equipment checked, pre-op evaluation, timeout performed and anesthesia consent Lidocaine 1% used for infiltration Left, radial was placed Catheter size: 20 Fr Hand hygiene performed , maximum sterile barriers used  and Seldinger technique used  Attempts: 1 Procedure performed using ultrasound guided technique. Ultrasound Notes:anatomy identified, needle tip was noted to be adjacent to the nerve/plexus identified and no ultrasound evidence of intravascular and/or intraneural injection Following insertion, dressing applied and Biopatch. Post procedure assessment: normal and unchanged  Patient tolerated the procedure well with no immediate complications. Additional procedure comments: Previous unsuccessful attempts by RT and CRNA. Marland Kitchen

## 2019-03-21 NOTE — Progress Notes (Signed)
Progress Note  Patient Name: Jesse Murphy Date of Encounter: 03/21/2019  Primary Cardiologist: Kate Sable, MD   Subjective   86 yo with hx of CAD, CABG  Developed respiratory failure and was reintubated  Has diuresed nicely on the lasix drip  Still in AF with RVR  On milrinone, levophed drips , amio drip  Inpatient Medications    Scheduled Meds: . acetaminophen  1,000 mg Oral Q6H   Or  . acetaminophen (TYLENOL) oral liquid 160 mg/5 mL  1,000 mg Per Tube Q6H  . aspirin EC  325 mg Oral Daily   Or  . aspirin  324 mg Per Tube Daily  . atorvastatin  80 mg Oral q1800  . bisacodyl  10 mg Oral Daily   Or  . bisacodyl  10 mg Rectal Daily  . chlorhexidine gluconate (MEDLINE KIT)  15 mL Mouth Rinse BID  . Chlorhexidine Gluconate Cloth  6 each Topical Daily  . docusate  200 mg Oral Daily  . enoxaparin (LOVENOX) injection  30 mg Subcutaneous Q24H  . feeding supplement (PRO-STAT SUGAR FREE 64)  30 mL Per Tube TID  . insulin aspart  0-24 Units Subcutaneous Q4H  . levalbuterol  1.25 mg Nebulization Q6H  . lidocaine  5 mL Intradermal Once  . mouth rinse  15 mL Mouth Rinse BID  . mouth rinse  15 mL Mouth Rinse 10 times per day  . metoCLOPramide (REGLAN) injection  10 mg Intravenous Q6H  . metolazone  10 mg Oral BID  . potassium chloride  20 mEq Per Tube Q4H  . sodium chloride flush  10-40 mL Intracatheter Q12H  . sodium chloride flush  3 mL Intravenous Q12H   Continuous Infusions: . sodium chloride    . sodium chloride    . sodium chloride 10 mL/hr (03/16/19 1710)  . amiodarone 30 mg/hr (03/21/19 0700)  . cefTRIAXone (ROCEPHIN)  IV Stopped (03/20/19 1142)  . dexmedetomidine (PRECEDEX) IV infusion 0.4 mcg/kg/hr (03/21/19 0700)  . feeding supplement (VITAL AF 1.2 CAL) 30 mL/hr at 03/21/19 0100  . furosemide (LASIX) infusion 10 mg/hr (03/21/19 0700)  . lactated ringers 10 mL/hr at 03/21/19 0700  . milrinone 0.125 mcg/kg/min (03/21/19 0700)  . norepinephrine (LEVOPHED) Adult  infusion 17 mcg/min (03/21/19 0700)   PRN Meds: sodium chloride, dextrose, fentaNYL (SUBLIMAZE) injection, ondansetron (ZOFRAN) IV, oxyCODONE, sodium chloride flush, sodium chloride flush, sodium chloride flush   Vital Signs    Vitals:   03/21/19 0630 03/21/19 0645 03/21/19 0700 03/21/19 0706  BP:    (!) 121/55  Pulse: (!) 112 (!) 107 (!) 110 (!) 118  Resp:    18  Temp:      TempSrc:      SpO2: 98% 98% 98% 97%  Weight:      Height:        Intake/Output Summary (Last 24 hours) at 03/21/2019 0751 Last data filed at 03/21/2019 0700 Gross per 24 hour  Intake 3144.14 ml  Output 3860 ml  Net -715.86 ml   Last 3 Weights 03/21/2019 03/20/2019 03/19/2019  Weight (lbs) 215 lb 6.2 oz 214 lb 15.2 oz 211 lb 6.7 oz  Weight (kg) 97.7 kg 97.5 kg 95.9 kg      Telemetry    AF with HR of 114  - Personally Reviewed  ECG     - Personally Reviewed  Physical Exam   GEN:  elderly male, on the ven t Neck: No JVD Cardiac:  irreg. Irreg.  Respiratory: Clear to auscultation  bilaterally. GI: Soft, nontender, non-distended  MS: No edema; No deformity. Neuro:  unable to assess  Psych: unable to assess.   Labs    High Sensitivity Troponin:   Recent Labs  Lab 03/13/19 1056 03/13/19 1251 03/13/19 2257 03/14/19 0402  TROPONINIHS 32* 440* 926* 585*      Chemistry Recent Labs  Lab 03/15/19 1453 03/16/19 0444 03/20/19 0334 03/20/19 0349 03/20/19 1214 03/20/19 1214 03/20/19 1724 03/20/19 1724 03/20/19 1745 03/21/19 0208 03/21/19 0400  NA 136   < > 132*   < > 136   < > 136   < > 135 132* 136  K 4.1   < > 3.9   < > 4.2   < > 3.6   < > 3.4* 3.2* 3.6  CL 100   < > 96*   < > 100  --  99  --   --  96*  --   CO2 26   < > 21*   < > 21*  --  22  --   --  24  --   GLUCOSE 144*   < > 143*   < > 202*  --  165*  --   --  372*  --   BUN 15   < > 41*   < > 47*  --  48*  --   --  50*  --   CREATININE 1.02   < > 1.91*   < > 2.02*  --  1.97*  --   --  1.78*  --   CALCIUM 8.7*   < > 7.8*   < > 7.8*   --  7.5*  --   --  7.1*  --   PROT 6.3*  --  6.0*  --   --   --   --   --   --  5.4*  --   ALBUMIN 3.3*  --  2.9*  --   --   --   --   --   --  2.3*  --   AST 18  --  57*  --   --   --   --   --   --  59*  --   ALT 10  --  40  --   --   --   --   --   --  56*  --   ALKPHOS 56  --  67  --   --   --   --   --   --  67  --   BILITOT 0.7  --  1.2  --   --   --   --   --   --  0.9  --   GFRNONAA >60   < > 31*   < > 29*  --  29*  --   --  33*  --   GFRAA >60   < > 35*   < > 33*  --  34*  --   --  39*  --   ANIONGAP 10   < > 15   < > 15  --  15  --   --  12  --    < > = values in this interval not displayed.     Hematology Recent Labs  Lab 03/20/19 1209 03/20/19 1745 03/20/19 1925 03/21/19 0208 03/21/19 0400  WBC 11.9*  --  12.4* 13.4*  --   RBC 2.56*  --  2.95* 3.05*  --  HGB 7.4*   < > 8.6* 9.0* 9.9*  HCT 23.6*   < > 26.9* 27.8* 29.0*  MCV 92.2  --  91.2 91.1  --   MCH 28.9  --  29.2 29.5  --   MCHC 31.4  --  32.0 32.4  --   RDW 15.0  --  14.3 14.6  --   PLT 232  --  243 265  --    < > = values in this interval not displayed.    BNPNo results for input(s): BNP, PROBNP in the last 168 hours.   DDimer No results for input(s): DDIMER in the last 168 hours.   Radiology    DG CHEST PORT 1 VIEW  Result Date: 03/20/2019 CLINICAL DATA:  Status post CABG EXAM: PORTABLE CHEST 1 VIEW COMPARISON:  Same-day radiograph FINDINGS: Endotracheal tube terminates 3.4 cm superior to the carina. Enteric tube courses below the diaphragm with distal tip terminating beyond the inferior margin of the film. Interval placement of a right-sided PICC line with distal tip terminating at the level of the distal SVC. Right IJ sheath is unchanged. Postsurgical changes from CABG. Stable heart size. Small bilateral pleural effusions with associated bibasilar atelectasis, stable from prior. No pneumothorax. IMPRESSION: 1. Interval placement of a right-sided PICC line with distal tip terminating at the level of the  distal SVC. 2. No pneumothorax. 3. Otherwise stable exam. Electronically Signed   By: Davina Poke D.O.   On: 03/20/2019 15:57   DG Chest Port 1 View  Result Date: 03/20/2019 CLINICAL DATA:  History of bypass surgery. Patient hree intubated. EXAM: PORTABLE CHEST 1 VIEW COMPARISON:  Chest x-ray 03/19/2019 FINDINGS: The endotracheal tube is 4 cm above the carina. The NG tube is coursing down the esophagus and into the stomach. The right IJ Cordis is in stable position. Stable mild cardiac enlargement. There are bilateral pleural effusions and bibasilar atelectasis. Improved aeration status post intubation with mild persistent pulmonary edema. IMPRESSION: 1. New ET and NG tubes in good position. 2. Persistent but improved pulmonary edema. 3. Persistent effusions and atelectasis. Electronically Signed   By: Marijo Sanes M.D.   On: 03/20/2019 07:01   DG Abd Portable 1V  Result Date: 03/20/2019 CLINICAL DATA:  Encounter for feeding tube placement EXAM: PORTABLE ABDOMEN - 1 VIEW 12:41 p.m. COMPARISON:  Radiograph dated 03/20/2019 at 10:56 a.m. FINDINGS: Feeding tube has been inserted. The tip is either looped back into the antrum of the stomach or in the third portion of the duodenum. NG tube tip is in the fundus of the stomach. Bowel gas pattern is normal. Persistent left pleural effusion. IMPRESSION: Feeding tube tip is either in the antrum of the stomach or in the third portion of the duodenum. Electronically Signed   By: Lorriane Shire M.D.   On: 03/20/2019 12:50   DG Abd Portable 1V  Result Date: 03/20/2019 CLINICAL DATA:  Abdominal pain. EXAM: PORTABLE ABDOMEN - 1 VIEW COMPARISON:  None. FINDINGS: OG tube is in place with the tip and side-port in the stomach. Bowel gas pattern is normal. No abnormal abdominal calcification. Multiple leads and wires overlie the upper abdomen. Rectal temperature probe noted. No unexpected abdominal calcification. No acute or focal bony abnormality. IMPRESSION: No acute  abnormality. NG tube in good position. Electronically Signed   By: Inge Rise M.D.   On: 03/20/2019 11:09   ECHOCARDIOGRAM LIMITED  Result Date: 03/20/2019   ECHOCARDIOGRAM LIMITED REPORT   Patient Name:   Jesse Murphy  Date of Exam: 03/20/2019 Medical Rec #:  [id number]    Height:       68.0 in Accession #:    [id number]   Weight:       214.9 lb Date of Birth:  [birth year]   BSA:          2.11 m Patient Age:    41 years     BP:           94/50 mmHg Patient Gender: M            HR:           103 bpm. Exam Location:  Inpatient  Procedure: Limited Echo, Color Doppler and Cardiac Doppler Indications:    Cardiomyopathy-Ischemic 414.8 / I25.5  History:        Patient has prior history of Echocardiogram examinations, most                 recent 03/13/2019.  Sonographer:    Vikki Ports Turrentine Referring Phys: [place] Comments: Unable to administer definity due to IV difficulty. IMPRESSIONS  1. Left ventricular ejection fraction, by visual estimation, is 25 to 30%. The left ventricle has severely decreased function. There is no increased left ventricular wall thickness.  2. Abnormal septal motion consistent with left bundle branch block.  3. The left ventricle demonstrates global hypokinesis. Images inadequate for focal wall motion abnormalities.  4. Left ventricular diastolic parameters are indeterminate due to atrial fibrillation.  5. Global right ventricle has normal systolc function.The right ventricular size is normal. no increase in right ventricular wall thickness.  6. Mild to moderate mitral annular calcification. No evidence of mitral valve regurgitation. No evidence of mitral stenosis.  7. The tricuspid valve was normal in structure. Tricuspid valve regurgitation is not demonstrated.  8. There is heavy focal calcification of the right coronary cusp. The mean AV gradient is 55mHg which may be underestimated in the setting of severe LV dysfunction.  9. The pulmonic valve was normal in  structure. 10. The inferior vena cava is normal in size with greater than 50% respiratory variability, suggesting right atrial pressure of 3 mmHg. 11. Small pericardial effusion. 12. The pericardial effusion is posterior to the left ventricle. FINDINGS  Left Ventricle: Left ventricular ejection fraction, by visual estimation, is 25 to 30%. The left ventricle has severely decreased function. The left ventricle demonstrates global hypokinesis. There is no increased left ventricular wall thickness. Abnormal (paradoxical) septal motion, consistent with left bundle branch block. The left ventricular diastology could not be evaluated due to atrial fibrillation. Left ventricular diastolic parameters are indeterminate. Right Ventricle: The right ventricular size is normal. No increase in right ventricular wall thickness. Global RV systolic function is has normal systolic function. Left Atrium: Left atrial size was normal in size. Right Atrium: Right atrial size was normal in size. Pericardium: A small pericardial effusion is present is seen. A small pericardial effusion is present. The pericardial effusion is posterior to the left ventricle. Mitral Valve: The mitral valve is normal in structure. Mild to moderate mitral annular calcification. No evidence of mitral valve stenosis by observation. No evidence of mitral valve regurgitation. Tricuspid Valve: The tricuspid valve is normal in structure. Tricuspid valve regurgitation is not demonstrated. Aortic Valve: The aortic valve is tricuspid. There is heavy focal calcification of the right coronary There is. Aortic valve regurgitation is not visualized. Mild aortic valve sclerosis is present, with no evidence of aortic valve stenosis. Aortic valve mean gradient  measures 5.0 mmHg. Aortic valve peak gradient measures 8.2 mmHg. Aortic valve area, by VTI measures 1.28 cm.  Pulmonic Valve: The pulmonic valve was normal in structure. Pulmonic valve regurgitation is not visualized  by color flow Doppler. Pulmonic regurgitation is not visualized by color flow Doppler. Aorta: The aortic root, ascending aorta and aortic arch are all structurally normal, with no evidence of dilitation or obstruction. Venous: The inferior vena cava is normal in size with greater than 50% respiratory variability, suggesting right atrial pressure of 3 mmHg. Shunts: There is no evidence of a patent foramen ovale. No ventricular septal defect is seen or detected. There is no evidence of an atrial septal defect. No atrial level shunt detected by color flow Doppler.  LEFT VENTRICLE          Normals PLAX 2D LVOT diam:     1.80 cm  2.0 cm LVOT Area:     2.54 cm 3.14 cm2  AORTIC VALVE                    Normals AV Area (Vmax):    1.57 cm AV Area (Vmean):   1.32 cm     3.06 cm2 AV Area (VTI):     1.28 cm AV Vmax:           143.00 cm/s AV Vmean:          102.000 cm/s 77 cm/s AV VTI:            0.213 m      3.15 cm2 AV Peak Grad:      8.2 mmHg AV Mean Grad:      5.0 mmHg     3 mmHg LVOT Vmax:         88.30 cm/s LVOT Vmean:        52.900 cm/s  75 cm/s LVOT VTI:          0.107 m      25.3 cm LVOT/AV VTI ratio: 0.50         1  SHUNTS Systemic VTI:  0.11 m Systemic Diam: 1.80 cm  Fransico Him MD Electronically signed by Fransico Him MD Signature Date/Time: 03/20/2019/2:42:12 PMThe mitral valve is normal in structure.    Final    [place] EKG SITE RITE  Result Date: 03/19/2019 If Site Rite image not attached, placement could not be confirmed due to current cardiac rhythm.   Cardiac Studies      Patient Profile     86 y.o. male with CAD .   CABG, chf   Assessment & Plan    1.  Acute on chronic CHF:  Diuresing well. Cont current meds  2.  CAD :  S/p CABG    3.  Atrial fib:   Cont amio .Marland Kitchen   HR is mildly tachycardic       For questions or updates, please contact [facility] Please consult [email or web address] for contact info under        Signed, Mertie Moores, MD  03/21/2019, 7:51 AM

## 2019-03-22 ENCOUNTER — Inpatient Hospital Stay (HOSPITAL_COMMUNITY): Payer: Medicare Other

## 2019-03-22 DIAGNOSIS — J9601 Acute respiratory failure with hypoxia: Secondary | ICD-10-CM

## 2019-03-22 LAB — GLUCOSE, CAPILLARY
Glucose-Capillary: 202 mg/dL — ABNORMAL HIGH (ref 70–99)
Glucose-Capillary: 180 mg/dL — ABNORMAL HIGH (ref 70–99)
Glucose-Capillary: 190 mg/dL — ABNORMAL HIGH (ref 70–99)
Glucose-Capillary: 201 mg/dL — ABNORMAL HIGH (ref 70–99)
Glucose-Capillary: 176 mg/dL — ABNORMAL HIGH (ref 70–99)
Glucose-Capillary: 186 mg/dL — ABNORMAL HIGH (ref 70–99)

## 2019-03-22 LAB — RENAL FUNCTION PANEL
Albumin: 2.5 g/dL — ABNORMAL LOW (ref 3.5–5.0)
Anion gap: 16 — ABNORMAL HIGH (ref 5–15)
BUN: 64 mg/dL — ABNORMAL HIGH (ref 8–23)
CO2: 26 mmol/L (ref 22–32)
Calcium: 7.9 mg/dL — ABNORMAL LOW (ref 8.9–10.3)
Chloride: 98 mmol/L (ref 98–111)
Creatinine, Ser: 1.86 mg/dL — ABNORMAL HIGH (ref 0.61–1.24)
GFR calc Af Amer: 37 mL/min — ABNORMAL LOW (ref >60)
GFR calc non Af Amer: 32 mL/min — ABNORMAL LOW (ref >60)
Glucose, Bld: 197 mg/dL — ABNORMAL HIGH (ref 70–99)
Phosphorus: 2.8 mg/dL (ref 2.5–4.6)
Potassium: 3.7 mmol/L (ref 3.5–5.1)
Sodium: 140 mmol/L (ref 135–145)

## 2019-03-22 LAB — POCT I-STAT, CHEM 8
BUN: 57 mg/dL — ABNORMAL HIGH (ref 8–23)
Calcium, Ion: 1.04 mmol/L — ABNORMAL LOW (ref 1.15–1.40)
Chloride: 97 mmol/L — ABNORMAL LOW (ref 98–111)
Creatinine, Ser: 1.9 mg/dL — ABNORMAL HIGH (ref 0.61–1.24)
Glucose, Bld: 214 mg/dL — ABNORMAL HIGH (ref 70–99)
HCT: 30 % — ABNORMAL LOW (ref 39.0–52.0)
Hemoglobin: 10.2 g/dL — ABNORMAL LOW (ref 13.0–17.0)
Potassium: 3.6 mmol/L (ref 3.5–5.1)
Sodium: 138 mmol/L (ref 135–145)
TCO2: 27 mmol/L (ref 22–32)

## 2019-03-22 LAB — POCT I-STAT 7, (LYTES, BLD GAS, ICA,H+H)
Acid-Base Excess: 2 mmol/L (ref 0.0–2.0)
Acid-Base Excess: 2 mmol/L (ref 0.0–2.0)
Bicarbonate: 26.8 mmol/L (ref 20.0–28.0)
Bicarbonate: 26.2 mmol/L (ref 20.0–28.0)
Calcium, Ion: 1.08 mmol/L — ABNORMAL LOW (ref 1.15–1.40)
Calcium, Ion: 1.06 mmol/L — ABNORMAL LOW (ref 1.15–1.40)
HCT: 29 % — ABNORMAL LOW (ref 39.0–52.0)
HCT: 29 % — ABNORMAL LOW (ref 39.0–52.0)
Hemoglobin: 9.9 g/dL — ABNORMAL LOW (ref 13.0–17.0)
Hemoglobin: 9.9 g/dL — ABNORMAL LOW (ref 13.0–17.0)
O2 Saturation: 95 %
O2 Saturation: 95 %
Patient temperature: 98.6
Patient temperature: 37
Potassium: 3.6 mmol/L (ref 3.5–5.1)
Potassium: 3.9 mmol/L (ref 3.5–5.1)
Sodium: 140 mmol/L (ref 135–145)
Sodium: 139 mmol/L (ref 135–145)
TCO2: 28 mmol/L (ref 22–32)
TCO2: 27 mmol/L (ref 22–32)
pCO2 arterial: 42.5 mmHg (ref 32.0–48.0)
pCO2 arterial: 37.9 mmHg (ref 32.0–48.0)
pH, Arterial: 7.407 (ref 7.350–7.450)
pH, Arterial: 7.448 (ref 7.350–7.450)
pO2, Arterial: 73 mmHg — ABNORMAL LOW (ref 83.0–108.0)
pO2, Arterial: 70 mmHg — ABNORMAL LOW (ref 83.0–108.0)

## 2019-03-22 LAB — CBC
HCT: 28.3 % — ABNORMAL LOW (ref 39.0–52.0)
Hemoglobin: 9.1 g/dL — ABNORMAL LOW (ref 13.0–17.0)
MCH: 29.2 pg (ref 26.0–34.0)
MCHC: 32.2 g/dL (ref 30.0–36.0)
MCV: 90.7 fL (ref 80.0–100.0)
Platelets: 288 10*3/uL (ref 150–400)
RBC: 3.12 MIL/uL — ABNORMAL LOW (ref 4.22–5.81)
RDW: 15 % (ref 11.5–15.5)
WBC: 13.3 10*3/uL — ABNORMAL HIGH (ref 4.0–10.5)
nRBC: 1.7 % — ABNORMAL HIGH (ref 0.0–0.2)

## 2019-03-22 LAB — COMPREHENSIVE METABOLIC PANEL
ALT: 55 U/L — ABNORMAL HIGH (ref 0–44)
AST: 59 U/L — ABNORMAL HIGH (ref 15–41)
Albumin: 2.3 g/dL — ABNORMAL LOW (ref 3.5–5.0)
Alkaline Phosphatase: 75 U/L (ref 38–126)
Anion gap: 14 (ref 5–15)
BUN: 57 mg/dL — ABNORMAL HIGH (ref 8–23)
CO2: 25 mmol/L (ref 22–32)
Calcium: 7.5 mg/dL — ABNORMAL LOW (ref 8.9–10.3)
Chloride: 98 mmol/L (ref 98–111)
Creatinine, Ser: 1.68 mg/dL — ABNORMAL HIGH (ref 0.61–1.24)
GFR calc Af Amer: 41 mL/min — ABNORMAL LOW (ref >60)
GFR calc non Af Amer: 36 mL/min — ABNORMAL LOW (ref >60)
Glucose, Bld: 263 mg/dL — ABNORMAL HIGH (ref 70–99)
Potassium: 3.5 mmol/L (ref 3.5–5.1)
Sodium: 137 mmol/L (ref 135–145)
Total Bilirubin: 1.1 mg/dL (ref 0.3–1.2)
Total Protein: 5.7 g/dL — ABNORMAL LOW (ref 6.5–8.1)

## 2019-03-22 LAB — COOXEMETRY PANEL
Carboxyhemoglobin: 0.9 % (ref 0.5–1.5)
Methemoglobin: 0.9 % (ref 0.0–1.5)
O2 Saturation: 63.5 %
Total hemoglobin: 9.4 g/dL — ABNORMAL LOW (ref 12.0–16.0)

## 2019-03-22 LAB — BRAIN NATRIURETIC PEPTIDE: B Natriuretic Peptide: 340.4 pg/mL — ABNORMAL HIGH (ref 0.0–100.0)

## 2019-03-22 MED ORDER — ENOXAPARIN SODIUM 40 MG/0.4ML ~~LOC~~ SOLN
40.0000 mg | SUBCUTANEOUS | Status: DC
  Administered 2019-03-22 – 2019-03-23 (×2): 40 mg via SUBCUTANEOUS
  Filled 2019-03-22 (×2): qty 0.4

## 2019-03-22 MED ORDER — POTASSIUM CHLORIDE 10 MEQ/50ML IV SOLN
10.0000 meq | INTRAVENOUS | Status: AC
  Administered 2019-03-22 (×3): 10 meq via INTRAVENOUS
  Filled 2019-03-22 (×3): qty 50

## 2019-03-22 MED ORDER — POTASSIUM CHLORIDE 10 MEQ/50ML IV SOLN
10.0000 meq | INTRAVENOUS | Status: AC
  Administered 2019-03-22 (×3): 10 meq via INTRAVENOUS
  Filled 2019-03-22: qty 50

## 2019-03-22 MED ORDER — TAMSULOSIN HCL 0.4 MG PO CAPS
0.4000 mg | ORAL_CAPSULE | Freq: Every day | ORAL | Status: DC
  Administered 2019-03-22 – 2019-03-25 (×3): 0.4 mg via ORAL
  Filled 2019-03-22 (×4): qty 1

## 2019-03-22 MED ORDER — ALBUMIN HUMAN 25 % IV SOLN
12.5000 g | Freq: Four times a day (QID) | INTRAVENOUS | Status: AC
  Administered 2019-03-22 – 2019-03-23 (×4): 12.5 g via INTRAVENOUS
  Filled 2019-03-22 (×2): qty 100

## 2019-03-22 NOTE — Plan of Care (Signed)
  Problem: Clinical Measurements: Goal: Diagnostic test results will improve Outcome: Progressing Goal: Respiratory complications will improve Outcome: Progressing Goal: Cardiovascular complication will be avoided Outcome: Progressing   Problem: Elimination: Goal: Will not experience complications related to bowel motility Outcome: Progressing Goal: Will not experience complications related to urinary retention Outcome: Progressing

## 2019-03-22 NOTE — Progress Notes (Signed)
Progress Note  Patient Name: Jesse Murphy Date of Encounter: 03/22/2019  Primary Cardiologist: Kate Sable, MD   Subjective   86 yo with hx of CAD, CABG  Developed respiratory failure and was reintubated  Has diuresed nicely on the lasix drip   Still has Afib with RVR . Still on the vent    Inpatient Medications    Scheduled Meds: . aspirin EC  325 mg Oral Daily   Or  . aspirin  324 mg Per Tube Daily  . atorvastatin  80 mg Oral q1800  . bisacodyl  10 mg Oral Daily   Or  . bisacodyl  10 mg Rectal Daily  . chlorhexidine gluconate (MEDLINE KIT)  15 mL Mouth Rinse BID  . Chlorhexidine Gluconate Cloth  6 each Topical Daily  . docusate  200 mg Oral Daily  . enoxaparin (LOVENOX) injection  40 mg Subcutaneous Q24H  . feeding supplement (PRO-STAT SUGAR FREE 64)  30 mL Per Tube TID  . insulin aspart  0-24 Units Subcutaneous Q4H  . levalbuterol  1.25 mg Nebulization Q6H  . lidocaine  5 mL Intradermal Once  . mouth rinse  15 mL Mouth Rinse BID  . mouth rinse  15 mL Mouth Rinse 10 times per day  . metoCLOPramide (REGLAN) injection  10 mg Intravenous Q6H  . metoprolol tartrate  25 mg Per Tube BID  . pantoprazole (PROTONIX) IV  40 mg Intravenous Q24H  . sodium chloride flush  10-40 mL Intracatheter Q12H  . sodium chloride flush  3 mL Intravenous Q12H  . tamsulosin  0.4 mg Oral Daily   Continuous Infusions: . sodium chloride    . sodium chloride    . sodium chloride 10 mL/hr (03/16/19 1710)  . amiodarone 30 mg/hr (03/22/19 0800)  . cefTRIAXone (ROCEPHIN)  IV Stopped (03/21/19 1049)  . dexmedetomidine (PRECEDEX) IV infusion 0.2 mcg/kg/hr (03/22/19 0800)  . diltiazem (CARDIZEM) infusion 10 mg/hr (03/22/19 0800)  . feeding supplement (VITAL AF 1.2 CAL) 50 mL/hr at 03/21/19 1230  . furosemide (LASIX) infusion 10 mg/hr (03/22/19 0800)  . lactated ringers 10 mL/hr at 03/22/19 0600  . milrinone 0.25 mcg/kg/min (03/22/19 0800)  . norepinephrine (LEVOPHED) Adult infusion 10  mcg/min (03/22/19 0800)   PRN Meds: sodium chloride, dextrose, fentaNYL (SUBLIMAZE) injection, ondansetron (ZOFRAN) IV, oxyCODONE, sodium chloride flush, sodium chloride flush, sodium chloride flush   Vital Signs    Vitals:   03/22/19 0800 03/22/19 0803 03/22/19 0815 03/22/19 0830  BP: 114/76 (!) 142/46    Pulse: (!) 107 (!) 125 (!) 128 (!) 117  Resp: (!) 26 (!) 30 (!) 29 (!) 28  Temp:      TempSrc:      SpO2: 96% 93% 95% 96%  Weight:      Height:        Intake/Output Summary (Last 24 hours) at 03/22/2019 0843 Last data filed at 03/22/2019 0800 Gross per 24 hour  Intake 3137.23 ml  Output 5521 ml  Net -2383.77 ml   Last 3 Weights 03/22/2019 03/21/2019 03/20/2019  Weight (lbs) 208 lb 5.4 oz 215 lb 6.2 oz 214 lb 15.2 oz  Weight (kg) 94.5 kg 97.7 kg 97.5 kg      Telemetry    AF with HR  120-130   - Personally Reviewed  ECG     - Personally Reviewed  Physical Exam   Physical Exam: Blood pressure (!) 142/46, pulse (!) 117, temperature 98.6 F (37 C), temperature source Oral, resp. rate (!) 28, height  5' 8" (1.727 m), weight 94.5 kg, SpO2 96 %.  GEN: intubated , elderly male,    HEENT: Normal NECK: No JVD; No carotid bruits LYMPHATICS: No lymphadenopathy CARDIAC: Irreg. Irreg.   Tachy  RESPIRATORY:  On the vent  ABDOMEN: Soft, non-tender, non-distended MUSCULOSKELETAL: 1-2 + edema ; No deformity  SKIN: Warm and dry NEUROLOGIC:  Alert and oriented x 3  Labs    High Sensitivity Troponin:   Recent Labs  Lab 03/13/19 1056 03/13/19 1251 03/13/19 2257 03/14/19 0402  TROPONINIHS 32* 440* 926* 585*      Chemistry Recent Labs  Lab 03/20/19 0334 03/20/19 0349 03/21/19 0208 03/21/19 0400 03/21/19 0904 03/21/19 1033 03/21/19 1654 03/21/19 1654 03/21/19 1757 03/22/19 0136 03/22/19 0401  NA 132*   < > 132*   < > 138   < > 139   < > 138 140 137  K 3.9   < > 3.2*   < > 4.0   < > 3.2*   < > 3.2* 3.6 3.5  CL 96*   < > 96*   < > 100  --  100  --   --   --  98  CO2  21*   < > 24   < > 26  --  25  --   --   --  25  GLUCOSE 143*   < > 372*   < > 193*  --  201*  --   --   --  263*  BUN 41*   < > 50*   < > 53*  --  55*  --   --   --  57*  CREATININE 1.91*   < > 1.78*   < > 1.83*  --  1.84*  --   --   --  1.68*  CALCIUM 7.8*   < > 7.1*   < > 7.7*  --  7.6*  --   --   --  7.5*  PROT 6.0*  --  5.4*  --   --   --   --   --   --   --  5.7*  ALBUMIN 2.9*  --  2.3*  --   --   --   --   --   --   --  2.3*  AST 57*  --  59*  --   --   --   --   --   --   --  59*  ALT 40  --  56*  --   --   --   --   --   --   --  55*  ALKPHOS 67  --  67  --   --   --   --   --   --   --  75  BILITOT 1.2  --  0.9  --   --   --   --   --   --   --  1.1  GFRNONAA 31*   < > 33*   < > 32*  --  32*  --   --   --  36*  GFRAA 35*   < > 39*   < > 37*  --  37*  --   --   --  41*  ANIONGAP 15   < > 12   < > 12  --  14  --   --   --  14   < > = values  in this interval not displayed.     Hematology Recent Labs  Lab 03/20/19 1925 03/20/19 1925 03/21/19 0208 03/21/19 0400 03/21/19 1757 03/22/19 0136 03/22/19 0401  WBC 12.4*  --  13.4*  --   --   --  13.3*  RBC 2.95*  --  3.05*  --   --   --  3.12*  HGB 8.6*   < > 9.0*   < > 9.5* 9.9* 9.1*  HCT 26.9*   < > 27.8*   < > 28.0* 29.0* 28.3*  MCV 91.2  --  91.1  --   --   --  90.7  MCH 29.2  --  29.5  --   --   --  29.2  MCHC 32.0  --  32.4  --   --   --  32.2  RDW 14.3  --  14.6  --   --   --  15.0  PLT 243  --  265  --   --   --  288   < > = values in this interval not displayed.    BNP Recent Labs  Lab 03/22/19 0401  BNP 340.4*     DDimer No results for input(s): DDIMER in the last 168 hours.   Radiology    DG Chest Port 1 View  Result Date: 03/22/2019 CLINICAL DATA:  Bypass surgery. EXAM: PORTABLE CHEST 1 VIEW COMPARISON:  03/21/2019 FINDINGS: The endotracheal tube, NG tube, feeding tube and right PICC line are stable. Improved lung aeration with resolving left pleural effusion and slight improved left basilar atelectasis.  No pulmonary edema or pneumothorax. IMPRESSION: 1. Stable support apparatus. 2. Improving lung aeration with improving left effusion and atelectasis. Electronically Signed   By: Marijo Sanes M.D.   On: 03/22/2019 06:32   DG Chest Port 1 View  Result Date: 03/21/2019 CLINICAL DATA:  Bypass surgery. EXAM: PORTABLE CHEST 1 VIEW COMPARISON:  03/20/2019 FINDINGS: The endotracheal tube is 2.4 cm above the carina. The feeding tube, NG tube and right IJ Cordis are stable. The right PICC line is stable. Stable small effusions and overlying bibasilar atelectasis. Vascular congestion without overt pulmonary edema. IMPRESSION: 1. Stable support apparatus. 2. Persistent small effusions and bibasilar atelectasis. Electronically Signed   By: Marijo Sanes M.D.   On: 03/21/2019 08:34   DG CHEST PORT 1 VIEW  Result Date: 03/20/2019 CLINICAL DATA:  Status post CABG EXAM: PORTABLE CHEST 1 VIEW COMPARISON:  Same-day radiograph FINDINGS: Endotracheal tube terminates 3.4 cm superior to the carina. Enteric tube courses below the diaphragm with distal tip terminating beyond the inferior margin of the film. Interval placement of a right-sided PICC line with distal tip terminating at the level of the distal SVC. Right IJ sheath is unchanged. Postsurgical changes from CABG. Stable heart size. Small bilateral pleural effusions with associated bibasilar atelectasis, stable from prior. No pneumothorax. IMPRESSION: 1. Interval placement of a right-sided PICC line with distal tip terminating at the level of the distal SVC. 2. No pneumothorax. 3. Otherwise stable exam. Electronically Signed   By: Davina Poke D.O.   On: 03/20/2019 15:57   DG Abd Portable 1V  Result Date: 03/20/2019 CLINICAL DATA:  Encounter for feeding tube placement EXAM: PORTABLE ABDOMEN - 1 VIEW 12:41 p.m. COMPARISON:  Radiograph dated 03/20/2019 at 10:56 a.m. FINDINGS: Feeding tube has been inserted. The tip is either looped back into the antrum of the stomach or  in the third portion of the duodenum. NG tube tip is in  the fundus of the stomach. Bowel gas pattern is normal. Persistent left pleural effusion. IMPRESSION: Feeding tube tip is either in the antrum of the stomach or in the third portion of the duodenum. Electronically Signed   By: Lorriane Shire M.D.   On: 03/20/2019 12:50   DG Abd Portable 1V  Result Date: 03/20/2019 CLINICAL DATA:  Abdominal pain. EXAM: PORTABLE ABDOMEN - 1 VIEW COMPARISON:  None. FINDINGS: OG tube is in place with the tip and side-port in the stomach. Bowel gas pattern is normal. No abnormal abdominal calcification. Multiple leads and wires overlie the upper abdomen. Rectal temperature probe noted. No unexpected abdominal calcification. No acute or focal bony abnormality. IMPRESSION: No acute abnormality. NG tube in good position. Electronically Signed   By: Inge Rise M.D.   On: 03/20/2019 11:09   ECHOCARDIOGRAM LIMITED  Result Date: 03/20/2019   ECHOCARDIOGRAM LIMITED REPORT   Patient Name:   SEANPATRICK MAISANO Date of Exam: 03/20/2019 Medical Rec #:  [id number]    Height:       68.0 in Accession #:    [id number]   Weight:       214.9 lb Date of Birth:  Dec 10, 1928   BSA:          2.11 m Patient Age:    36 years     BP:           94/50 mmHg Patient Gender: M            HR:           103 bpm. Exam Location:  Inpatient  Procedure: Limited Echo, Color Doppler and Cardiac Doppler Indications:    Cardiomyopathy-Ischemic 414.8 / I25.5  History:        Patient has prior history of Echocardiogram examinations, most                 recent 03/13/2019.  Sonographer:    Vikki Ports [place] Referring Phys: [place] Comments: Unable to administer definity due to IV difficulty. IMPRESSIONS  1. Left ventricular ejection fraction, by visual estimation, is 25 to 30%. The left ventricle has severely decreased function. There is no increased left ventricular wall thickness.  2. Abnormal septal motion consistent with left bundle branch  block.  3. The left ventricle demonstrates global hypokinesis. Images inadequate for focal wall motion abnormalities.  4. Left ventricular diastolic parameters are indeterminate due to atrial fibrillation.  5. Global right ventricle has normal systolc function.The right ventricular size is normal. no increase in right ventricular wall thickness.  6. Mild to moderate mitral annular calcification. No evidence of mitral valve regurgitation. No evidence of mitral stenosis.  7. The tricuspid valve was normal in structure. Tricuspid valve regurgitation is not demonstrated.  8. There is heavy focal calcification of the right coronary cusp. The mean AV gradient is 79mHg which may be underestimated in the setting of severe LV dysfunction.  9. The pulmonic valve was normal in structure. 10. The inferior vena cava is normal in size with greater than 50% respiratory variability, suggesting right atrial pressure of 3 mmHg. 11. Small pericardial effusion. 12. The pericardial effusion is posterior to the left ventricle. FINDINGS  Left Ventricle: Left ventricular ejection fraction, by visual estimation, is 25 to 30%. The left ventricle has severely decreased function. The left ventricle demonstrates global hypokinesis. There is no increased left ventricular wall thickness. Abnormal (paradoxical) septal motion, consistent with left bundle branch block. The left ventricular diastology could not be  evaluated due to atrial fibrillation. Left ventricular diastolic parameters are indeterminate. Right Ventricle: The right ventricular size is normal. No increase in right ventricular wall thickness. Global RV systolic function is has normal systolic function. Left Atrium: Left atrial size was normal in size. Right Atrium: Right atrial size was normal in size. Pericardium: A small pericardial effusion is present is seen. A small pericardial effusion is present. The pericardial effusion is posterior to the left ventricle. Mitral Valve: The  mitral valve is normal in structure. Mild to moderate mitral annular calcification. No evidence of mitral valve stenosis by observation. No evidence of mitral valve regurgitation. Tricuspid Valve: The tricuspid valve is normal in structure. Tricuspid valve regurgitation is not demonstrated. Aortic Valve: The aortic valve is tricuspid. There is heavy focal calcification of the right coronary There is. Aortic valve regurgitation is not visualized. Mild aortic valve sclerosis is present, with no evidence of aortic valve stenosis. Aortic valve mean gradient measures 5.0 mmHg. Aortic valve peak gradient measures 8.2 mmHg. Aortic valve area, by VTI measures 1.28 cm.  Pulmonic Valve: The pulmonic valve was normal in structure. Pulmonic valve regurgitation is not visualized by color flow Doppler. Pulmonic regurgitation is not visualized by color flow Doppler. Aorta: The aortic root, ascending aorta and aortic arch are all structurally normal, with no evidence of dilitation or obstruction. Venous: The inferior vena cava is normal in size with greater than 50% respiratory variability, suggesting right atrial pressure of 3 mmHg. Shunts: There is no evidence of a patent foramen ovale. No ventricular septal defect is seen or detected. There is no evidence of an atrial septal defect. No atrial level shunt detected by color flow Doppler.  LEFT VENTRICLE          Normals PLAX 2D LVOT diam:     1.80 cm  2.0 cm LVOT Area:     2.54 cm 3.14 cm2  AORTIC VALVE                    Normals AV Area (Vmax):    1.57 cm AV Area (Vmean):   1.32 cm     3.06 cm2 AV Area (VTI):     1.28 cm AV Vmax:           143.00 cm/s AV Vmean:          102.000 cm/s 77 cm/s AV VTI:            0.213 m      3.15 cm2 AV Peak Grad:      8.2 mmHg AV Mean Grad:      5.0 mmHg     3 mmHg LVOT Vmax:         88.30 cm/s LVOT Vmean:        52.900 cm/s  75 cm/s LVOT VTI:          0.107 m      25.3 cm LVOT/AV VTI ratio: 0.50         1  SHUNTS Systemic VTI:  0.11 m  Systemic Diam: 1.80 cm  Fransico Him MD Electronically signed by Fransico Him MD Signature Date/Time: 03/20/2019/2:42:12 PMThe mitral valve is normal in structure.    Final     Cardiac Studies      Patient Profile     86 y.o. male with CAD .   CHF, Afib    Assessment & Plan    1.  Acute on chronic CHF:  Continues to diurese.  - 2.5 liters yesterday  2.  CAD :  S/p CABG    3.  Atrial fib:   Cont amio .Marland Kitchen  HR remains tachycardic.   Cont amio.        For questions or updates, please contact [place] Please consult [email or web address] for contact info under        Signed, Mertie Moores, MD  03/22/2019, 8:43 AM

## 2019-03-22 NOTE — Progress Notes (Addendum)
[address]       [place] [address]             [phone]      6 Days Post-Op Procedure(s) (LRB): CORONARY ARTERY BYPASS GRAFTING (CABG), ON PUMP, TIMES THREE, USING LEFT INTERNAL MAMMARY ARTERY, RIGHT GREATER SAPHENOUS VEIN HARVESTED ENDOSCOPICALLY (N/A) MAZE (N/A) CLIPPING OF LEFT  ATRIAL APPENDAGE - USING ATRICLIP SIZE 40 (N/A) TRANSESOPHAGEAL ECHOCARDIOGRAM (TEE) (N/A) Subjective: Stable overall. Hoping to extubate off today  Objective: Vital signs in last 24 hours: Temp:  [97.6 F (36.4 C)-99 F (37.2 C)] 98.6 F (37 C) (02/04 0730) Pulse Rate:  [41-146] 125 (02/04 0803) Cardiac Rhythm: Atrial fibrillation (02/04 0400) Resp:  [14-32] 30 (02/04 0803) BP: (83-159)/(40-65) 142/46 (02/04 0803) SpO2:  [86 %-98 %] 93 % (02/04 0803) Arterial Line BP: (83-172)/(35-65) 155/54 (02/04 0730) FiO2 (%):  [40 %-50 %] 40 % (02/04 0803) Weight:  [94.5 kg] 94.5 kg (02/04 0357)  Hemodynamic parameters for last 24 hours: CVP:  [0 mmHg-15 mmHg] 11 mmHg  Intake/Output from previous day: 02/03 0701 - 02/04 0700 In: 3280.8 [I.V.:1683.2; NG/GT:1415; IV Piggyback:182.6] Out: 5726 [Urine:4345; Emesis/NG output:230; OBSJG:2836] Intake/Output this shift: Total I/O In: 228.6 [I.V.:61.1; NG/GT:160; IV Piggyback:7.5] Out: 170 [Urine:170]  General appearance: alert, cooperative and no distress Heart: irregularly irregular rhythm and tachy Lungs: somewhat coarse promarily in upper airways Abdomen: soft, non-tender Extremities: edema conts to improve Wound: incis healing well, scant drainage from sternal incision  Lab Results: Recent Labs    03/21/19 0208 03/21/19 0400 03/22/19 0136 03/22/19 0401  WBC 13.4*  --   --  13.3*  HGB 9.0*   < > 9.9* 9.1*  HCT 27.8*   < > 29.0* 28.3*  PLT 265  --   --  288   < > = values in this interval not displayed.   BMET:  Recent Labs    03/21/19 1654 03/21/19 1757 03/22/19 0136 03/22/19 0401  NA 139   < > 140 137  K  3.2*   < > 3.6 3.5  CL 100  --   --  98  CO2 25  --   --  25  GLUCOSE 201*  --   --  263*  BUN 55*  --   --  57*  CREATININE 1.84*  --   --  1.68*  CALCIUM 7.6*  --   --  7.5*   < > = values in this interval not displayed.    PT/INR: No results for input(s): LABPROT, INR in the last 72 hours. ABG    Component Value Date/Time   PHART 7.407 03/22/2019 0136   HCO3 26.8 03/22/2019 0136   TCO2 28 03/22/2019 0136   ACIDBASEDEF 1.0 03/20/2019 1745   O2SAT 63.5 03/22/2019 0343   CBG (last 3)  Recent Labs    03/21/19 2348 03/22/19 0400 03/22/19 0740  GLUCAP 177* 202* 180*    Meds Scheduled Meds: . aspirin EC  325 mg Oral Daily   Or  . aspirin  324 mg Per Tube Daily  . atorvastatin  80 mg Oral q1800  . bisacodyl  10 mg Oral Daily   Or  . bisacodyl  10 mg Rectal Daily  . chlorhexidine gluconate (MEDLINE KIT)  15 mL Mouth Rinse BID  . Chlorhexidine Gluconate Cloth  6 each Topical Daily  . docusate  200 mg Oral Daily  . enoxaparin (LOVENOX) injection  30 mg Subcutaneous Q24H  . feeding supplement (PRO-STAT  SUGAR FREE 64)  30 mL Per Tube TID  . insulin aspart  0-24 Units Subcutaneous Q4H  . levalbuterol  1.25 mg Nebulization Q6H  . lidocaine  5 mL Intradermal Once  . mouth rinse  15 mL Mouth Rinse BID  . mouth rinse  15 mL Mouth Rinse 10 times per day  . metoCLOPramide (REGLAN) injection  10 mg Intravenous Q6H  . metoprolol tartrate  25 mg Per Tube BID  . pantoprazole (PROTONIX) IV  40 mg Intravenous Q24H  . sodium chloride flush  10-40 mL Intracatheter Q12H  . sodium chloride flush  3 mL Intravenous Q12H   Continuous Infusions: . sodium chloride    . sodium chloride    . sodium chloride 10 mL/hr (03/16/19 1710)  . amiodarone 30 mg/hr (03/22/19 0800)  . cefTRIAXone (ROCEPHIN)  IV Stopped (03/21/19 1049)  . dexmedetomidine (PRECEDEX) IV infusion 0.2 mcg/kg/hr (03/22/19 0800)  . diltiazem (CARDIZEM) infusion 10 mg/hr (03/22/19 0800)  . feeding supplement (VITAL AF 1.2  CAL) 50 mL/hr at 03/21/19 1230  . furosemide (LASIX) infusion 10 mg/hr (03/22/19 0800)  . lactated ringers 10 mL/hr at 03/22/19 0600  . milrinone 0.25 mcg/kg/min (03/22/19 0800)  . norepinephrine (LEVOPHED) Adult infusion 10 mcg/min (03/22/19 0800)   PRN Meds:.sodium chloride, dextrose, fentaNYL (SUBLIMAZE) injection, ondansetron (ZOFRAN) IV, oxyCODONE, sodium chloride flush, sodium chloride flush, sodium chloride flush  Xrays DG Chest Port 1 View  Result Date: 03/22/2019 CLINICAL DATA:  Bypass surgery. EXAM: PORTABLE CHEST 1 VIEW COMPARISON:  03/21/2019 FINDINGS: The endotracheal tube, NG tube, feeding tube and right PICC line are stable. Improved lung aeration with resolving left pleural effusion and slight improved left basilar atelectasis. No pulmonary edema or pneumothorax. IMPRESSION: 1. Stable support apparatus. 2. Improving lung aeration with improving left effusion and atelectasis. Electronically Signed   By: Marijo Sanes M.D.   On: 03/22/2019 06:32   DG Chest Port 1 View  Result Date: 03/21/2019 CLINICAL DATA:  Bypass surgery. EXAM: PORTABLE CHEST 1 VIEW COMPARISON:  03/20/2019 FINDINGS: The endotracheal tube is 2.4 cm above the carina. The feeding tube, NG tube and right IJ Cordis are stable. The right PICC line is stable. Stable small effusions and overlying bibasilar atelectasis. Vascular congestion without overt pulmonary edema. IMPRESSION: 1. Stable support apparatus. 2. Persistent small effusions and bibasilar atelectasis. Electronically Signed   By: Marijo Sanes M.D.   On: 03/21/2019 08:34   DG CHEST PORT 1 VIEW  Result Date: 03/20/2019 CLINICAL DATA:  Status post CABG EXAM: PORTABLE CHEST 1 VIEW COMPARISON:  Same-day radiograph FINDINGS: Endotracheal tube terminates 3.4 cm superior to the carina. Enteric tube courses below the diaphragm with distal tip terminating beyond the inferior margin of the film. Interval placement of a right-sided PICC line with distal tip terminating at  the level of the distal SVC. Right IJ sheath is unchanged. Postsurgical changes from CABG. Stable heart size. Small bilateral pleural effusions with associated bibasilar atelectasis, stable from prior. No pneumothorax. IMPRESSION: 1. Interval placement of a right-sided PICC line with distal tip terminating at the level of the distal SVC. 2. No pneumothorax. 3. Otherwise stable exam. Electronically Signed   By: Davina Poke D.O.   On: 03/20/2019 15:57   DG Abd Portable 1V  Result Date: 03/20/2019 CLINICAL DATA:  Encounter for feeding tube placement EXAM: PORTABLE ABDOMEN - 1 VIEW 12:41 p.m. COMPARISON:  Radiograph dated 03/20/2019 at 10:56 a.m. FINDINGS: Feeding tube has been inserted. The tip is either looped back into the antrum of  the stomach or in the third portion of the duodenum. NG tube tip is in the fundus of the stomach. Bowel gas pattern is normal. Persistent left pleural effusion. IMPRESSION: Feeding tube tip is either in the antrum of the stomach or in the third portion of the duodenum. Electronically Signed   By: Lorriane Shire M.D.   On: 03/20/2019 12:50   DG Abd Portable 1V  Result Date: 03/20/2019 CLINICAL DATA:  Abdominal pain. EXAM: PORTABLE ABDOMEN - 1 VIEW COMPARISON:  None. FINDINGS: OG tube is in place with the tip and side-port in the stomach. Bowel gas pattern is normal. No abnormal abdominal calcification. Multiple leads and wires overlie the upper abdomen. Rectal temperature probe noted. No unexpected abdominal calcification. No acute or focal bony abnormality. IMPRESSION: No acute abnormality. NG tube in good position. Electronically Signed   By: Inge Rise M.D.   On: 03/20/2019 11:09   ECHOCARDIOGRAM LIMITED  Result Date: 03/20/2019   ECHOCARDIOGRAM LIMITED REPORT   Patient Name:   Jesse Murphy Date of Exam: 03/20/2019 Medical Rec #:  [id number]    Height:       68.0 in Accession #:    [id number]   Weight:       214.9 lb Date of Birth:  May 02, 1928   BSA:          2.11 m  Patient Age:    18 years     BP:           94/50 mmHg Patient Gender: M            HR:           103 bpm. Exam Location:  Inpatient  Procedure: Limited Echo, Color Doppler and Cardiac Doppler Indications:    Cardiomyopathy-Ischemic 414.8 / I25.5  History:        Patient has prior history of Echocardiogram examinations, most                 recent 03/13/2019.  Sonographer:    Vikki Ports Turrentine Referring Phys: [employer] Comments: Unable to administer definity due to IV difficulty. IMPRESSIONS  1. Left ventricular ejection fraction, by visual estimation, is 25 to 30%. The left ventricle has severely decreased function. There is no increased left ventricular wall thickness.  2. Abnormal septal motion consistent with left bundle branch block.  3. The left ventricle demonstrates global hypokinesis. Images inadequate for focal wall motion abnormalities.  4. Left ventricular diastolic parameters are indeterminate due to atrial fibrillation.  5. Global right ventricle has normal systolc function.The right ventricular size is normal. no increase in right ventricular wall thickness.  6. Mild to moderate mitral annular calcification. No evidence of mitral valve regurgitation. No evidence of mitral stenosis.  7. The tricuspid valve was normal in structure. Tricuspid valve regurgitation is not demonstrated.  8. There is heavy focal calcification of the right coronary cusp. The mean AV gradient is 31mHg which may be underestimated in the setting of severe LV dysfunction.  9. The pulmonic valve was normal in structure. 10. The inferior vena cava is normal in size with greater than 50% respiratory variability, suggesting right atrial pressure of 3 mmHg. 11. Small pericardial effusion. 12. The pericardial effusion is posterior to the left ventricle. FINDINGS  Left Ventricle: Left ventricular ejection fraction, by visual estimation, is 25 to 30%. The left ventricle has severely decreased function. The left  ventricle demonstrates global hypokinesis. There is no increased left ventricular wall thickness. Abnormal (  paradoxical) septal motion, consistent with left bundle branch block. The left ventricular diastology could not be evaluated due to atrial fibrillation. Left ventricular diastolic parameters are indeterminate. Right Ventricle: The right ventricular size is normal. No increase in right ventricular wall thickness. Global RV systolic function is has normal systolic function. Left Atrium: Left atrial size was normal in size. Right Atrium: Right atrial size was normal in size. Pericardium: A small pericardial effusion is present is seen. A small pericardial effusion is present. The pericardial effusion is posterior to the left ventricle. Mitral Valve: The mitral valve is normal in structure. Mild to moderate mitral annular calcification. No evidence of mitral valve stenosis by observation. No evidence of mitral valve regurgitation. Tricuspid Valve: The tricuspid valve is normal in structure. Tricuspid valve regurgitation is not demonstrated. Aortic Valve: The aortic valve is tricuspid. There is heavy focal calcification of the right coronary There is. Aortic valve regurgitation is not visualized. Mild aortic valve sclerosis is present, with no evidence of aortic valve stenosis. Aortic valve mean gradient measures 5.0 mmHg. Aortic valve peak gradient measures 8.2 mmHg. Aortic valve area, by VTI measures 1.28 cm.  Pulmonic Valve: The pulmonic valve was normal in structure. Pulmonic valve regurgitation is not visualized by color flow Doppler. Pulmonic regurgitation is not visualized by color flow Doppler. Aorta: The aortic root, ascending aorta and aortic arch are all structurally normal, with no evidence of dilitation or obstruction. Venous: The inferior vena cava is normal in size with greater than 50% respiratory variability, suggesting right atrial pressure of 3 mmHg. Shunts: There is no evidence of a patent  foramen ovale. No ventricular septal defect is seen or detected. There is no evidence of an atrial septal defect. No atrial level shunt detected by color flow Doppler.  LEFT VENTRICLE          Normals PLAX 2D LVOT diam:     1.80 cm  2.0 cm LVOT Area:     2.54 cm 3.14 cm2  AORTIC VALVE                    Normals AV Area (Vmax):    1.57 cm AV Area (Vmean):   1.32 cm     3.06 cm2 AV Area (VTI):     1.28 cm AV Vmax:           143.00 cm/s AV Vmean:          102.000 cm/s 77 cm/s AV VTI:            0.213 m      3.15 cm2 AV Peak Grad:      8.2 mmHg AV Mean Grad:      5.0 mmHg     3 mmHg LVOT Vmax:         88.30 cm/s LVOT Vmean:        52.900 cm/s  75 cm/s LVOT VTI:          0.107 m      25.3 cm LVOT/AV VTI ratio: 0.50         1  SHUNTS Systemic VTI:  0.11 m Systemic Diam: 1.80 cm  Fransico Him MD Electronically signed by Fransico Him MD Signature Date/Time: 03/20/2019/2:42:12 PMThe mitral valve is normal in structure.    Final     Results for orders placed or performed during the hospital encounter of 03/13/19  Respiratory Panel by RT PCR (Flu A&B, Covid) - Nasopharyngeal Swab     Status: None  Collection Time: 03/13/19  1:49 PM   Specimen: Nasopharyngeal Swab  Result Value Ref Range Status   SARS Coronavirus 2 by RT PCR NEGATIVE NEGATIVE Final    Comment: (NOTE) SARS-CoV-2 target nucleic acids are NOT DETECTED. The SARS-CoV-2 RNA is generally detectable in upper respiratoy specimens during the acute phase of infection. The lowest concentration of SARS-CoV-2 viral copies this assay can detect is 131 copies/mL. A negative result does not preclude SARS-Cov-2 infection and should not be used as the sole basis for treatment or other patient management decisions. A negative result may occur with  improper specimen collection/handling, submission of specimen other than nasopharyngeal swab, presence of viral mutation(s) within the areas targeted by this assay, and inadequate number of viral copies (<131  copies/mL). A negative result must be combined with clinical observations, patient history, and epidemiological information. The expected result is Negative. Fact Sheet for Patients:  PinkCheek.be Fact Sheet for Healthcare Providers:  GravelBags.it This test is not yet ap proved or cleared by the [place] FDA and  has been authorized for detection and/or diagnosis of SARS-CoV-2 by FDA under an Emergency Use Authorization (EUA). This EUA will remain  in effect (meaning this test can be used) for the duration of the COVID-19 declaration under Section 564(b)(1) of the Act, 21 U.S.C. section 360bbb-3(b)(1), unless the authorization is terminated or revoked sooner.    Influenza A by PCR NEGATIVE NEGATIVE Final   Influenza B by PCR NEGATIVE NEGATIVE Final    Comment: (NOTE) The Xpert Xpress SARS-CoV-2/FLU/RSV assay is intended as an aid in  the diagnosis of influenza from Nasopharyngeal swab specimens and  should not be used as a sole basis for treatment. Nasal washings and  aspirates are unacceptable for Xpert Xpress SARS-CoV-2/FLU/RSV  testing. Fact Sheet for Patients: PinkCheek.be Fact Sheet for Healthcare Providers: GravelBags.it This test is not yet approved or cleared by the [place] FDA and  has been authorized for detection and/or diagnosis of SARS-CoV-2 by  FDA under an Emergency Use Authorization (EUA). This EUA will remain  in effect (meaning this test can be used) for the duration of the  Covid-19 declaration under Section 564(b)(1) of the Act, 21  U.S.C. section 360bbb-3(b)(1), unless the authorization is  terminated or revoked. Performed at [facility], [address], [place], [place] [address]   MRSA PCR Screening     Status: None   Collection Time: 03/13/19  5:18 PM   Specimen: Nasal Mucosa; Nasopharyngeal  Result Value Ref Range Status   MRSA  by PCR NEGATIVE NEGATIVE Final    Comment:        The GeneXpert MRSA Assay (FDA approved for NASAL specimens only), is one component of a comprehensive MRSA colonization surveillance program. It is not intended to diagnose MRSA infection nor to guide or monitor treatment for MRSA infections. Performed at [facility], [address] [place], [place] [address]   Surgical pcr screen     Status: None   Collection Time: 03/15/19  9:28 PM   Specimen: Nasal Mucosa; Nasal Swab  Result Value Ref Range Status   MRSA, PCR NEGATIVE NEGATIVE Final   Staphylococcus aureus NEGATIVE NEGATIVE Final    Comment: (NOTE) The Xpert SA Assay (FDA approved for NASAL specimens in patients 55 years of age and older), is one component of a comprehensive surveillance program. It is not intended to diagnose infection nor to guide or monitor treatment. Performed at [facility], [place] [address] [place], [place] [address]  Culture, respiratory (non-expectorated)     Status: None   Collection Time: 03/19/19  3:00 PM   Specimen: Tracheal Aspirate; Respiratory  Result Value Ref Range Status   Specimen Description TRACHEAL ASPIRATE  Final   Special Requests Normal  Final   Gram Stain   Final    ABUNDANT WBC PRESENT,BOTH PMN AND MONONUCLEAR FEW GRAM POSITIVE COCCI RARE GRAM NEGATIVE RODS    Culture   Final    FEW Consistent with normal respiratory flora. Performed at [facility], [place] [address] [place], [place] [address]    Report Status 03/21/2019 FINAL  Final    Assessment/Plan: S/P Procedure(s) (LRB): CORONARY ARTERY BYPASS GRAFTING (CABG), ON PUMP, TIMES THREE, USING LEFT INTERNAL MAMMARY ARTERY, RIGHT GREATER SAPHENOUS VEIN HARVESTED ENDOSCOPICALLY (N/A) MAZE (N/A) CLIPPING OF LEFT  ATRIAL APPENDAGE - USING ATRICLIP SIZE 40 (N/A) TRANSESOPHAGEAL ECHOCARDIOGRAM (TEE) (N/A)  1 slow progress conts, but improving 2 volume overload improving on lasix gtt renal fxn improving,  excellent diuresis- continue 3 stable hemodynamics- wean levo off today hopedully, Co-Ox- 63, also on milrinone 4 afib remains fast - now on cardizem at 10/hr  with amio gtt/lopressor per tube 5 H/H fairly stable 6 leukocytosis is stable, conts rocephin for gr- rods in sputum 7 cont SSI 8 cont TF's 9 vent wean per PCCM  LOS: 8 days    John Giovanni [facility] 03/22/2019 Pager [phone]  Pulmonary status improved with diuresis- vent wean and extubation if not tachypneic Improved cardiac output with milrinone Renal function slowly improved Postop afib on iv amio and cardizem Feeding tube in place  On vital 1.5 Ceftriaxone for probable pneumonia  patient examined and medical record reviewed,agree with above note. Tharon Aquas Trigt III 03/22/2019

## 2019-03-22 NOTE — Progress Notes (Signed)
Daily Progress Note  Patient Details:    Jesse Murphy is an 86 y.o. male with a h/o CAD, afib, s/p CABG and clipping of left atrial appendage p/w shortness of breath on 2/1.  Patient became hypoxic 2/2 pulm edema and needed to be intubated on 2/2.     PMHx: CAD, afib  Significant Hospital Events:  1/29 CABG + clipping left atrial appendage 2/1 transfer to CVICU 2/2 SOB 2/2 intubated  Significant Diagnostic Tests:  2/3 CXR shows mild improvement in pulm effusion, edema 2/2 CXR shows pulm edema 2/1 CXR shows likely pulm edema 1/29 Intraoperative Echo, EF of 45% 1/26 Preop Echo, EF of 30-35%  Micro Data: 2/1 sputum sent, culture consistent w/ normal resp flora 1/26 MRSA PCR screening negative 1/26 Covid negative  Antimicrobials: Ceftriaxone 2/1 -   Interim Hx/ Subjective: Decreased precedex but patient became agitated and hypoxemic.  Increased to prior dose and patient settled.  Lines, Airways, Drains: Introducer Double Lumen 03/16/19 Internal Jugular Right (Active)  Site Assessment Clean;Dry;Intact 03/19/19 0800  Proximal Lumen Status Capped 03/19/19 0800  Distal Lumen Status Infusing 03/19/19 0800  Dressing Type Transparent;Occlusive 03/19/19 0800  Dressing Status Clean;Dry;Intact;Antimicrobial disc in place 03/19/19 0800  Dressing Interventions Dressing changed 03/19/19 0800  Dressing Change Due 03/26/19 03/19/19 0800     External Urinary Catheter (Active)  Collection Container Standard drainage bag 03/19/19 0800  Securement Method Securing device (Describe) 03/19/19 0800  Site Assessment Clean;Intact;Dry 03/19/19 0800  Output (mL) 0 mL 03/19/19 1500    Anti-infectives:  Anti-infectives (From admission, onward)   Start     Dose/Rate Route Frequency Ordered Stop   03/19/19 1000  cefTRIAXone (ROCEPHIN) 1 g in sodium chloride 0.9 % 100 mL IVPB     1 g 200 mL/hr over 30 Minutes Intravenous Daily 03/19/19 0810     03/16/19 2230  vancomycin (VANCOCIN) IVPB 1000  mg/200 mL premix     1,000 mg 200 mL/hr over 60 Minutes Intravenous  Once 03/16/19 1721 03/17/19 0024   03/16/19 1830  cefUROXime (ZINACEF) 1.5 g in sodium chloride 0.9 % 100 mL IVPB     1.5 g 200 mL/hr over 30 Minutes Intravenous Every 12 hours 03/16/19 1721 03/18/19 0557   03/16/19 0400  vancomycin (VANCOREADY) IVPB 1500 mg/300 mL  Status:  Discontinued     1,500 mg 150 mL/hr over 120 Minutes Intravenous To Surgery 03/15/19 1425 03/16/19 1715   03/16/19 0400  cefUROXime (ZINACEF) 1.5 g in sodium chloride 0.9 % 100 mL IVPB  Status:  Discontinued     1.5 g 200 mL/hr over 30 Minutes Intravenous To Surgery 03/15/19 1425 03/16/19 1715   03/16/19 0400  cefUROXime (ZINACEF) 750 mg in sodium chloride 0.9 % 100 mL IVPB     750 mg 200 mL/hr over 30 Minutes Intravenous To Surgery 03/15/19 1425 03/16/19 1540      Microbiology: Results for orders placed or performed during the hospital encounter of 03/13/19  Respiratory Panel by RT PCR (Flu A&B, Covid) - Nasopharyngeal Swab     Status: None   Collection Time: 03/13/19  1:49 PM   Specimen: Nasopharyngeal Swab  Result Value Ref Range Status   SARS Coronavirus 2 by RT PCR NEGATIVE NEGATIVE Final    Comment: (NOTE) SARS-CoV-2 target nucleic acids are NOT DETECTED. The SARS-CoV-2 RNA is generally detectable in upper respiratoy specimens during the acute phase of infection. The lowest concentration of SARS-CoV-2 viral copies this assay can detect is 131 copies/mL. A negative result does  not preclude SARS-Cov-2 infection and should not be used as the sole basis for treatment or other patient management decisions. A negative result may occur with  improper specimen collection/handling, submission of specimen other than nasopharyngeal swab, presence of viral mutation(s) within the areas targeted by this assay, and inadequate number of viral copies (<131 copies/mL). A negative result must be combined with clinical observations, patient history, and  epidemiological information. The expected result is Negative. Fact Sheet for Patients:  PinkCheek.be Fact Sheet for Healthcare Providers:  GravelBags.it This test is not yet ap proved or cleared by the [place] FDA and  has been authorized for detection and/or diagnosis of SARS-CoV-2 by FDA under an Emergency Use Authorization (EUA). This EUA will remain  in effect (meaning this test can be used) for the duration of the COVID-19 declaration under Section 564(b)(1) of the Act, 21 U.S.C. section 360bbb-3(b)(1), unless the authorization is terminated or revoked sooner.    Influenza A by PCR NEGATIVE NEGATIVE Final   Influenza B by PCR NEGATIVE NEGATIVE Final    Comment: (NOTE) The Xpert Xpress SARS-CoV-2/FLU/RSV assay is intended as an aid in  the diagnosis of influenza from Nasopharyngeal swab specimens and  should not be used as a sole basis for treatment. Nasal washings and  aspirates are unacceptable for Xpert Xpress SARS-CoV-2/FLU/RSV  testing. Fact Sheet for Patients: PinkCheek.be Fact Sheet for Healthcare Providers: GravelBags.it This test is not yet approved or cleared by the [place] FDA and  has been authorized for detection and/or diagnosis of SARS-CoV-2 by  FDA under an Emergency Use Authorization (EUA). This EUA will remain  in effect (meaning this test can be used) for the duration of the  Covid-19 declaration under Section 564(b)(1) of the Act, 21  U.S.C. section 360bbb-3(b)(1), unless the authorization is  terminated or revoked. Performed at [facility], [address], [place], [place] [address]   MRSA PCR Screening     Status: None   Collection Time: 03/13/19  5:18 PM   Specimen: Nasal Mucosa; Nasopharyngeal  Result Value Ref Range Status   MRSA by PCR NEGATIVE NEGATIVE Final    Comment:        The GeneXpert MRSA Assay (FDA approved for  NASAL specimens only), is one component of a comprehensive MRSA colonization surveillance program. It is not intended to diagnose MRSA infection nor to guide or monitor treatment for MRSA infections. Performed at [facility], [address], [place], [place] [address]   Surgical pcr screen     Status: None   Collection Time: 03/15/19  9:28 PM   Specimen: Nasal Mucosa; Nasal Swab  Result Value Ref Range Status   MRSA, PCR NEGATIVE NEGATIVE Final   Staphylococcus aureus NEGATIVE NEGATIVE Final    Comment: (NOTE) The Xpert SA Assay (FDA approved for NASAL specimens in patients 25 years of age and older), is one component of a comprehensive surveillance program. It is not intended to diagnose infection nor to guide or monitor treatment. Performed at [facility], [place] [address] [place], [place] [address]   Culture, respiratory (non-expectorated)     Status: None   Collection Time: 03/19/19  3:00 PM   Specimen: Tracheal Aspirate; Respiratory  Result Value Ref Range Status   Specimen Description TRACHEAL ASPIRATE  Final   Special Requests Normal  Final   Gram Stain   Final    ABUNDANT WBC PRESENT,BOTH PMN AND MONONUCLEAR FEW GRAM POSITIVE COCCI RARE GRAM NEGATIVE RODS    Culture  Final    FEW Consistent with normal respiratory flora. Performed at [facility], [place] [address] [place], [place] [address]    Report Status 03/21/2019 FINAL  Final    Best Practice/Protocols:  VTE Prophylaxis: Lovenox (30mg  subcutaneous q24 hours)   Consults: Treatment Team:  Satira Sark, MD Prescott Gum, Collier Salina, MD   Objective:  Vital signs for last 24 hours: Temp:  [97.6 F (36.4 C)-99 F (37.2 C)] 98.6 F (37 C) (02/04 0730) Pulse Rate:  [41-146] 123 (02/04 0915) Resp:  [14-32] 31 (02/04 0930) BP: (83-159)/(40-76) 102/65 (02/04 0900) SpO2:  [86 %-98 %] 97 % (02/04 0930) Arterial Line BP: (83-172)/(35-65) 149/52 (02/04 0930) FiO2 (%):  [40 %-50 %] 40 % (02/04 0803)  Weight:  [94.5 kg] 94.5 kg (02/04 0357)  Hemodynamic parameters for last 24 hours: CVP:  [0 mmHg-15 mmHg] 11 mmHg  Intake/Output from previous day: 02/03 0701 - 02/04 0700 In: 3280.8 [I.V.:1683.2; NG/GT:1415; IV Piggyback:182.6] Out: 5726 [Urine:4345; Emesis/NG output:230; MHDQQ:2297]  Intake/Output this shift: Total I/O In: 414.6 [I.V.:117.1; NG/GT:290; IV Piggyback:7.5] Out: 380 [Urine:280; Stool:100]  Vent settings for last 24 hours: Vent Mode: PRVC FiO2 (%):  [40 %-50 %] 40 % Set Rate:  [20 bmp] 20 bmp Vt Set:  [550 mL] 550 mL PEEP:  [8 cmH20] 8 cmH20 Pressure Support:  [10 cmH20] 10 cmH20 Plateau Pressure:  [17 cmH20] 17 cmH20  Physical Exam:  General: intubated, responsive to commands Neuro: sedated, but responds to commands HEENT/Neck: ET WNL Resp: lungs CTAB, tachypneic  CVS: IRR and tachycardic GI: soft, non-tender, mildly distended Skin: no rash, warm & dry Extremities: edematous UEs and LEs  Assessment/Plan:  Hypercarbic Hypoxic Respiratory Failure: Likely 2/2 cardiogenic pulmonary edema.  Resp culture consistent with normal respiratory flora.  Pt remains afebrile.  Improvement on CXR 2/4.   -cont ceftriaxone  -cont chest physiotherapy -cont xopenex q6 hours   Hypervolemic: CVP 11 today.  Net of -2.4 L yesterday.  Still up 2.7L from admission weight. -Lasix 250mg  IV -?d/c metolazone 5mg  oral, check with cardiology  Afib with RVR: -cont amiodarone 30mg /hr IV -cont milrinone, wean levophed today -metoprolol 25mg  bid -diltizaem 10mg   AKI: -BUN/Creatinine 57 and 1.68 -CTM for now  Anemia: -HGB 9.1 this morning, CTM  Hematuria/Dysuria:  -resolved  Critical Care Total Time*: Harvey, MS4 03/22/2019  *Care during the described time interval was provided by me and/or other providers on the critical care team.  I have reviewed this patient's available data, including medical history, events of note, physical examination and test  results as part of my evaluation.

## 2019-03-23 ENCOUNTER — Inpatient Hospital Stay (HOSPITAL_COMMUNITY): Payer: Medicare Other

## 2019-03-23 LAB — POCT I-STAT, CHEM 8
BUN: 80 mg/dL — ABNORMAL HIGH (ref 8–23)
Calcium, Ion: 1.08 mmol/L — ABNORMAL LOW (ref 1.15–1.40)
Chloride: 96 mmol/L — ABNORMAL LOW (ref 98–111)
Creatinine, Ser: 2.3 mg/dL — ABNORMAL HIGH (ref 0.61–1.24)
Glucose, Bld: 191 mg/dL — ABNORMAL HIGH (ref 70–99)
HCT: 28 % — ABNORMAL LOW (ref 39.0–52.0)
Hemoglobin: 9.5 g/dL — ABNORMAL LOW (ref 13.0–17.0)
Potassium: 3.4 mmol/L — ABNORMAL LOW (ref 3.5–5.1)
Sodium: 138 mmol/L (ref 135–145)
TCO2: 26 mmol/L (ref 22–32)

## 2019-03-23 LAB — POCT I-STAT 7, (LYTES, BLD GAS, ICA,H+H)
Acid-Base Excess: 6 mmol/L — ABNORMAL HIGH (ref 0.0–2.0)
Acid-Base Excess: 4 mmol/L — ABNORMAL HIGH (ref 0.0–2.0)
Bicarbonate: 29.3 mmol/L — ABNORMAL HIGH (ref 20.0–28.0)
Bicarbonate: 27.4 mmol/L (ref 20.0–28.0)
Calcium, Ion: 1.06 mmol/L — ABNORMAL LOW (ref 1.15–1.40)
Calcium, Ion: 1.1 mmol/L — ABNORMAL LOW (ref 1.15–1.40)
HCT: 29 % — ABNORMAL LOW (ref 39.0–52.0)
HCT: 40 % (ref 39.0–52.0)
Hemoglobin: 9.9 g/dL — ABNORMAL LOW (ref 13.0–17.0)
Hemoglobin: 13.6 g/dL (ref 13.0–17.0)
O2 Saturation: 96 %
O2 Saturation: 90 %
Patient temperature: 99.5
Patient temperature: 98.2
Potassium: 3.5 mmol/L (ref 3.5–5.1)
Potassium: 3.4 mmol/L — ABNORMAL LOW (ref 3.5–5.1)
Sodium: 137 mmol/L (ref 135–145)
Sodium: 137 mmol/L (ref 135–145)
TCO2: 30 mmol/L (ref 22–32)
TCO2: 29 mmol/L (ref 22–32)
pCO2 arterial: 36.6 mmHg (ref 32.0–48.0)
pCO2 arterial: 35.4 mmHg (ref 32.0–48.0)
pH, Arterial: 7.514 — ABNORMAL HIGH (ref 7.350–7.450)
pH, Arterial: 7.497 — ABNORMAL HIGH (ref 7.350–7.450)
pO2, Arterial: 78 mmHg — ABNORMAL LOW (ref 83.0–108.0)
pO2, Arterial: 53 mmHg — ABNORMAL LOW (ref 83.0–108.0)

## 2019-03-23 LAB — COMPREHENSIVE METABOLIC PANEL
ALT: 41 U/L (ref 0–44)
AST: 49 U/L — ABNORMAL HIGH (ref 15–41)
Albumin: 2.6 g/dL — ABNORMAL LOW (ref 3.5–5.0)
Alkaline Phosphatase: 66 U/L (ref 38–126)
Anion gap: 14 (ref 5–15)
BUN: 78 mg/dL — ABNORMAL HIGH (ref 8–23)
CO2: 25 mmol/L (ref 22–32)
Calcium: 7.8 mg/dL — ABNORMAL LOW (ref 8.9–10.3)
Chloride: 99 mmol/L (ref 98–111)
Creatinine, Ser: 2.05 mg/dL — ABNORMAL HIGH (ref 0.61–1.24)
GFR calc Af Amer: 33 mL/min — ABNORMAL LOW (ref >60)
GFR calc non Af Amer: 28 mL/min — ABNORMAL LOW (ref >60)
Glucose, Bld: 216 mg/dL — ABNORMAL HIGH (ref 70–99)
Potassium: 3.7 mmol/L (ref 3.5–5.1)
Sodium: 138 mmol/L (ref 135–145)
Total Bilirubin: 1 mg/dL (ref 0.3–1.2)
Total Protein: 5.5 g/dL — ABNORMAL LOW (ref 6.5–8.1)

## 2019-03-23 LAB — CBC
HCT: 26.1 % — ABNORMAL LOW (ref 39.0–52.0)
Hemoglobin: 8.2 g/dL — ABNORMAL LOW (ref 13.0–17.0)
MCH: 28.6 pg (ref 26.0–34.0)
MCHC: 31.4 g/dL (ref 30.0–36.0)
MCV: 90.9 fL (ref 80.0–100.0)
Platelets: 301 10*3/uL (ref 150–400)
RBC: 2.87 MIL/uL — ABNORMAL LOW (ref 4.22–5.81)
RDW: 15.2 % (ref 11.5–15.5)
WBC: 11.5 10*3/uL — ABNORMAL HIGH (ref 4.0–10.5)
nRBC: 1.2 % — ABNORMAL HIGH (ref 0.0–0.2)

## 2019-03-23 LAB — GLUCOSE, CAPILLARY
Glucose-Capillary: 204 mg/dL — ABNORMAL HIGH (ref 70–99)
Glucose-Capillary: 169 mg/dL — ABNORMAL HIGH (ref 70–99)
Glucose-Capillary: 182 mg/dL — ABNORMAL HIGH (ref 70–99)
Glucose-Capillary: 156 mg/dL — ABNORMAL HIGH (ref 70–99)
Glucose-Capillary: 147 mg/dL — ABNORMAL HIGH (ref 70–99)

## 2019-03-23 LAB — COOXEMETRY PANEL
Carboxyhemoglobin: 1.1 % (ref 0.5–1.5)
Methemoglobin: 0.6 % (ref 0.0–1.5)
O2 Saturation: 59 %
Total hemoglobin: 8.1 g/dL — ABNORMAL LOW (ref 12.0–16.0)

## 2019-03-23 MED ORDER — ORAL CARE MOUTH RINSE
15.0000 mL | Freq: Two times a day (BID) | OROMUCOSAL | Status: DC
  Administered 2019-03-23 – 2019-03-25 (×5): 15 mL via OROMUCOSAL

## 2019-03-23 MED ORDER — POTASSIUM CHLORIDE 10 MEQ/50ML IV SOLN
10.0000 meq | INTRAVENOUS | Status: AC
  Administered 2019-03-23 (×3): 10 meq via INTRAVENOUS
  Filled 2019-03-23 (×2): qty 50

## 2019-03-23 MED ORDER — CHLORHEXIDINE GLUCONATE 0.12 % MT SOLN
15.0000 mL | Freq: Two times a day (BID) | OROMUCOSAL | Status: DC
  Administered 2019-03-23 – 2019-03-25 (×5): 15 mL via OROMUCOSAL
  Filled 2019-03-23 (×2): qty 15

## 2019-03-23 MED ORDER — METOCLOPRAMIDE HCL 5 MG/ML IJ SOLN
5.0000 mg | Freq: Four times a day (QID) | INTRAMUSCULAR | Status: DC
  Administered 2019-03-23 (×2): 5 mg via INTRAVENOUS
  Filled 2019-03-23 (×2): qty 2

## 2019-03-23 MED ORDER — FUROSEMIDE 10 MG/ML IJ SOLN
80.0000 mg | Freq: Two times a day (BID) | INTRAMUSCULAR | Status: DC
  Administered 2019-03-23 – 2019-03-25 (×5): 80 mg via INTRAVENOUS
  Filled 2019-03-23 (×5): qty 8

## 2019-03-23 MED ORDER — POTASSIUM CHLORIDE 20 MEQ/15ML (10%) PO SOLN
20.0000 meq | ORAL | Status: AC
  Administered 2019-03-24 (×3): 20 meq
  Filled 2019-03-23 (×3): qty 15

## 2019-03-23 NOTE — Progress Notes (Addendum)
[facility] DAILY ICU PROGRESS NOTE                   [address]            [place] [id number]          [phone]   7 Days Post-Op Procedure(s) (LRB): CORONARY ARTERY BYPASS GRAFTING (CABG), ON PUMP, TIMES THREE, USING LEFT INTERNAL MAMMARY ARTERY, RIGHT GREATER SAPHENOUS VEIN HARVESTED ENDOSCOPICALLY (N/A) MAZE (N/A) CLIPPING OF LEFT  ATRIAL APPENDAGE - USING ATRICLIP SIZE 40 (N/A) TRANSESOPHAGEAL ECHOCARDIOGRAM (TEE) (N/A)  Total Length of Stay:  LOS: 9 days   Subjective: Making some progress with vent wean  Objective: Vital signs in last 24 hours: Temp:  [98.5 F (36.9 C)-99.5 F (37.5 C)] 98.8 F (37.1 C) (02/05 0800) Pulse Rate:  [31-134] 101 (02/05 0700) Cardiac Rhythm: Atrial fibrillation (02/05 0500) Resp:  [13-32] 17 (02/05 0700) BP: (87-140)/(45-123) 107/48 (02/05 0700) SpO2:  [88 %-100 %] 97 % (02/05 0820) Arterial Line BP: (72-159)/(35-80) 141/43 (02/05 0700) FiO2 (%):  [40 %-50 %] 40 % (02/05 0820) Weight:  [94.6 kg] 94.6 kg (02/05 0500)  Filed Weights   03/21/19 0241 03/22/19 0357 03/23/19 0500  Weight: 97.7 kg 94.5 kg 94.6 kg    Weight change: 0.1 kg   Hemodynamic parameters for last 24 hours: CVP:  [8 mmHg-13 mmHg] 13 mmHg  Intake/Output from previous day: 02/04 0701 - 02/05 0700 In: 2573.2 [I.V.:1435.4; NG/GT:860; IV Piggyback:277.8] Out: 2790 [Urine:2190; Stool:600]  Intake/Output this shift: Total I/O In: -  Out: 160 [Urine:160]  Current Meds: Scheduled Meds: . aspirin EC  325 mg Oral Daily   Or  . aspirin  324 mg Per Tube Daily  . atorvastatin  80 mg Oral q1800  . bisacodyl  10 mg Oral Daily   Or  . bisacodyl  10 mg Rectal Daily  . chlorhexidine gluconate (MEDLINE KIT)  15 mL Mouth Rinse BID  . Chlorhexidine Gluconate Cloth  6 each Topical Daily  . docusate  200 mg Oral Daily  . enoxaparin (LOVENOX) injection  40 mg Subcutaneous Q24H  . feeding supplement (PRO-STAT SUGAR FREE 64)  30 mL Per Tube TID  . furosemide   80 mg Intravenous Q12H  . insulin aspart  0-24 Units Subcutaneous Q4H  . levalbuterol  1.25 mg Nebulization Q6H  . lidocaine  5 mL Intradermal Once  . mouth rinse  15 mL Mouth Rinse BID  . mouth rinse  15 mL Mouth Rinse 10 times per day  . metoCLOPramide (REGLAN) injection  5 mg Intravenous Q6H  . metoprolol tartrate  25 mg Per Tube BID  . pantoprazole (PROTONIX) IV  40 mg Intravenous Q24H  . sodium chloride flush  10-40 mL Intracatheter Q12H  . sodium chloride flush  3 mL Intravenous Q12H  . tamsulosin  0.4 mg Oral Daily   Continuous Infusions: . sodium chloride    . sodium chloride    . sodium chloride 10 mL/hr at 03/22/19 2000  . amiodarone 30 mg/hr (03/23/19 0600)  . cefTRIAXone (ROCEPHIN)  IV Stopped (03/22/19 1000)  . dexmedetomidine (PRECEDEX) IV infusion 0.4 mcg/kg/hr (03/23/19 0600)  . diltiazem (CARDIZEM) infusion 5 mg/hr (03/23/19 0810)  . feeding supplement (VITAL AF 1.2 CAL) 50 mL/hr at 03/22/19 2000  . lactated ringers 10 mL/hr at 03/22/19 2000  . milrinone 0.25 mcg/kg/min (03/23/19 0600)  . norepinephrine (LEVOPHED) Adult infusion 6 mcg/min (03/23/19 0600)  . potassium chloride 10 mEq (03/23/19 0806)   PRN Meds:.sodium  chloride, dextrose, fentaNYL (SUBLIMAZE) injection, ondansetron (ZOFRAN) IV, oxyCODONE, sodium chloride flush, sodium chloride flush, sodium chloride flush  General appearance: sedated Heart: irregularly irregular rhythm Lungs: clear to auscultation bilaterally Abdomen: + BS, mim distension Extremities: edema conts to improve Wound: incis healing well  Lab Results: CBC: Recent Labs    03/22/19 0401 03/22/19 0943 03/23/19 0350 03/23/19 0408  WBC 13.3*  --  11.5*  --   HGB 9.1*   < > 8.2* 9.9*  HCT 28.3*   < > 26.1* 29.0*  PLT 288  --  301  --    < > = values in this interval not displayed.   BMET:  Recent Labs    03/22/19 1540 03/22/19 1540 03/22/19 1718 03/22/19 1718 03/23/19 0350 03/23/19 0408  NA 140   < > 138   < > 138 137    K 3.7   < > 3.6   < > 3.7 3.5  CL 98   < > 97*  --  99  --   CO2 26  --   --   --  25  --   GLUCOSE 197*   < > 214*  --  216*  --   BUN 64*   < > 57*  --  78*  --   CREATININE 1.86*   < > 1.90*  --  2.05*  --   CALCIUM 7.9*  --   --   --  7.8*  --    < > = values in this interval not displayed.    CMET: Lab Results  Component Value Date   WBC 11.5 (H) 03/23/2019   HGB 9.9 (L) 03/23/2019   HCT 29.0 (L) 03/23/2019   PLT 301 03/23/2019   GLUCOSE 216 (H) 03/23/2019   CHOL 117 03/16/2019   TRIG 52 03/16/2019   HDL 33 (L) 03/16/2019   LDLCALC 74 03/16/2019   ALT 41 03/23/2019   AST 49 (H) 03/23/2019   NA 137 03/23/2019   K 3.5 03/23/2019   CL 99 03/23/2019   CREATININE 2.05 (H) 03/23/2019   BUN 78 (H) 03/23/2019   CO2 25 03/23/2019   TSH 3.683 03/13/2019   INR 1.6 (H) 03/16/2019   HGBA1C 6.1 (H) 03/13/2019      PT/INR: No results for input(s): LABPROT, INR in the last 72 hours. Radiology: No results found.   Assessment/Plan: S/P Procedure(s) (LRB): CORONARY ARTERY BYPASS GRAFTING (CABG), ON PUMP, TIMES THREE, USING LEFT INTERNAL MAMMARY ARTERY, RIGHT GREATER SAPHENOUS VEIN HARVESTED ENDOSCOPICALLY (N/A) MAZE (N/A) CLIPPING OF LEFT  ATRIAL APPENDAGE - USING ATRICLIP SIZE 40 (N/A) TRANSESOPHAGEAL ECHOCARDIOGRAM (TEE) (N/A)  1 conts slow but steady overall progress 2 afib with BBB, vent ectopy- wean cardizem off , HR remains rapid . Unable to increase beta blocker with HF at this time, cont amio gtt-  did have MAZE procedure 3 hemodynamics pretty stable on milrinone and levo- Co-Ox 59 , wean as able 4 transitioning to bolus dosing for lasix from gtt, BUN/creat  rising- monitor closely as may begetting some level of prerenal azotemia. Edema improving and may be somewhat nutrition related as well. CVP 13 5 leukocytosis improving , no fevers, + loose stools prob related to TF's but may need to consider checking Cdiff if becomes more clinically relevent. 6 tachypnea affecting  vent wean- cont as able to wean as per PCCM. Mixed Alkalosis on most recent ABG 7 replace K+ 8 CXR conts to improve, on rocephin since 2/1  John Giovanni  03/23/2019 8:24 AM   Hope to extubate today, resume PT and OOB to chair Transition off lasix drip to scheduled doses with rising BUN and better BP Iv amio and oral lopressor for afib  patient examined and medical record reviewed,agree with above note. Tharon Aquas Trigt III 03/23/2019

## 2019-03-23 NOTE — Progress Notes (Signed)
[facility] Evening Rounds  POD #7 s/p CABG Extubated today Resting comfortably Exam unremarkable BP 126/60   Pulse (!) 123   Temp 98.2 F (36.8 C) (Oral)   Resp (!) 33   Ht 5\' 8"  (1.727 m)   Wt 94.6 kg   SpO2 97%   BMI 31.71 kg/m     Intake/Output Summary (Last 24 hours) at 03/23/2019 1749 Last data filed at 03/23/2019 1700 Gross per 24 hour  Intake 3618.69 ml  Output 2405 ml  Net 1213.69 ml  a/p: continue diuresis pulm toilet Follow abg  Jesse Murphy Z. Orvan Seen, [place]

## 2019-03-23 NOTE — Progress Notes (Signed)
Daily Progress Note  Patient Details:    Jesse Murphy is an 86 y.o. male with a h/o CAD, afib, s/p CABG and clipping of left atrial appendage p/w shortness of breath on 2/1.  Patient became hypoxic 2/2 pulm edema and needed to be intubated on 2/2. Extubated 2/5.    PMHx: CAD, afib  Significant Hospital Events:  1/29 CABG + clipping left atrial appendage 2/1 transfer to CVICU 2/2 SOB 2/2 intubated 2/5 extubated  Significant Diagnostic Tests:  2/5 CXR shows Persistent densities at the left lung base are suggestive for consolidation and probable small left pleural effusion. Stable mild interstitial edema. 2/4 CXR shows improving lung aeration with improving left effusion and atelectasis. 2/3 CXR shows mild improvement in pulm effusion, edema 2/2 CXR shows pulm edema 2/1 CXR shows likely pulm edema 1/29 Intraoperative Echo, EF of 45% 1/26 Preop Echo, EF of 30-35%  Micro Data: 2/1 sputum sent, culture consistent w/ normal resp flora 1/26 MRSA PCR screening negative 1/26 Covid negative  Antimicrobials: Ceftriaxone 2/1 -   Interim Hx/ Subjective: Overbreathing over set ventilator overnight.  Per nurse, he had a few desats when moving him this morning which did not happen yesterday.  Lines, Airways, Drains: Introducer Double Lumen 03/16/19 Internal Jugular Right (Active)  Site Assessment Clean;Dry;Intact 03/19/19 0800  Proximal Lumen Status Capped 03/19/19 0800  Distal Lumen Status Infusing 03/19/19 0800  Dressing Type Transparent;Occlusive 03/19/19 0800  Dressing Status Clean;Dry;Intact;Antimicrobial disc in place 03/19/19 0800  Dressing Interventions Dressing changed 03/19/19 0800  Dressing Change Due 03/26/19 03/19/19 0800     External Urinary Catheter (Active)  Collection Container Standard drainage bag 03/19/19 0800  Securement Method Securing device (Describe) 03/19/19 0800  Site Assessment Clean;Intact;Dry 03/19/19 0800  Output (mL) 0 mL 03/19/19 1500     Anti-infectives:  Anti-infectives (From admission, onward)   Start     Dose/Rate Route Frequency Ordered Stop   03/19/19 1000  cefTRIAXone (ROCEPHIN) 1 g in sodium chloride 0.9 % 100 mL IVPB     1 g 200 mL/hr over 30 Minutes Intravenous Daily 03/19/19 0810     03/16/19 2230  vancomycin (VANCOCIN) IVPB 1000 mg/200 mL premix     1,000 mg 200 mL/hr over 60 Minutes Intravenous  Once 03/16/19 1721 03/17/19 0024   03/16/19 1830  cefUROXime (ZINACEF) 1.5 g in sodium chloride 0.9 % 100 mL IVPB     1.5 g 200 mL/hr over 30 Minutes Intravenous Every 12 hours 03/16/19 1721 03/18/19 0557   03/16/19 0400  vancomycin (VANCOREADY) IVPB 1500 mg/300 mL  Status:  Discontinued     1,500 mg 150 mL/hr over 120 Minutes Intravenous To Surgery 03/15/19 1425 03/16/19 1715   03/16/19 0400  cefUROXime (ZINACEF) 1.5 g in sodium chloride 0.9 % 100 mL IVPB  Status:  Discontinued     1.5 g 200 mL/hr over 30 Minutes Intravenous To Surgery 03/15/19 1425 03/16/19 1715   03/16/19 0400  cefUROXime (ZINACEF) 750 mg in sodium chloride 0.9 % 100 mL IVPB     750 mg 200 mL/hr over 30 Minutes Intravenous To Surgery 03/15/19 1425 03/16/19 1540      Microbiology: Results for orders placed or performed during the hospital encounter of 03/13/19  Respiratory Panel by RT PCR (Flu A&B, Covid) - Nasopharyngeal Swab     Status: None   Collection Time: 03/13/19  1:49 PM   Specimen: Nasopharyngeal Swab  Result Value Ref Range Status   SARS Coronavirus 2 by RT PCR NEGATIVE NEGATIVE Final  Comment: (NOTE) SARS-CoV-2 target nucleic acids are NOT DETECTED. The SARS-CoV-2 RNA is generally detectable in upper respiratoy specimens during the acute phase of infection. The lowest concentration of SARS-CoV-2 viral copies this assay can detect is 131 copies/mL. A negative result does not preclude SARS-Cov-2 infection and should not be used as the sole basis for treatment or other patient management decisions. A negative result may  occur with  improper specimen collection/handling, submission of specimen other than nasopharyngeal swab, presence of viral mutation(s) within the areas targeted by this assay, and inadequate number of viral copies (<131 copies/mL). A negative result must be combined with clinical observations, patient history, and epidemiological information. The expected result is Negative. Fact Sheet for Patients:  PinkCheek.be Fact Sheet for Healthcare Providers:  GravelBags.it This test is not yet ap proved or cleared by the [place] FDA and  has been authorized for detection and/or diagnosis of SARS-CoV-2 by FDA under an Emergency Use Authorization (EUA). This EUA will remain  in effect (meaning this test can be used) for the duration of the COVID-19 declaration under Section 564(b)(1) of the Act, 21 U.S.C. section 360bbb-3(b)(1), unless the authorization is terminated or revoked sooner.    Influenza A by PCR NEGATIVE NEGATIVE Final   Influenza B by PCR NEGATIVE NEGATIVE Final    Comment: (NOTE) The Xpert Xpress SARS-CoV-2/FLU/RSV assay is intended as an aid in  the diagnosis of influenza from Nasopharyngeal swab specimens and  should not be used as a sole basis for treatment. Nasal washings and  aspirates are unacceptable for Xpert Xpress SARS-CoV-2/FLU/RSV  testing. Fact Sheet for Patients: PinkCheek.be Fact Sheet for Healthcare Providers: GravelBags.it This test is not yet approved or cleared by the [place] FDA and  has been authorized for detection and/or diagnosis of SARS-CoV-2 by  FDA under an Emergency Use Authorization (EUA). This EUA will remain  in effect (meaning this test can be used) for the duration of the  Covid-19 declaration under Section 564(b)(1) of the Act, 21  U.S.C. section 360bbb-3(b)(1), unless the authorization is  terminated or  revoked. Performed at [facility], [address], [place], [place] [address]   MRSA PCR Screening     Status: None   Collection Time: 03/13/19  5:18 PM   Specimen: Nasal Mucosa; Nasopharyngeal  Result Value Ref Range Status   MRSA by PCR NEGATIVE NEGATIVE Final    Comment:        The GeneXpert MRSA Assay (FDA approved for NASAL specimens only), is one component of a comprehensive MRSA colonization surveillance program. It is not intended to diagnose MRSA infection nor to guide or monitor treatment for MRSA infections. Performed at [facility], [address] [place], [place] [address]   Surgical pcr screen     Status: None   Collection Time: 03/15/19  9:28 PM   Specimen: Nasal Mucosa; Nasal Swab  Result Value Ref Range Status   MRSA, PCR NEGATIVE NEGATIVE Final   Staphylococcus aureus NEGATIVE NEGATIVE Final    Comment: (NOTE) The Xpert SA Assay (FDA approved for NASAL specimens in patients 64 years of age and older), is one component of a comprehensive surveillance program. It is not intended to diagnose infection nor to guide or monitor treatment. Performed at [facility], [place] [address] [place], [place] [address]   Culture, respiratory (non-expectorated)     Status: None   Collection Time: 03/19/19  3:00 PM   Specimen: Tracheal Aspirate; Respiratory  Result Value Ref Range Status  Specimen Description TRACHEAL ASPIRATE  Final   Special Requests Normal  Final   Gram Stain   Final    ABUNDANT WBC PRESENT,BOTH PMN AND MONONUCLEAR FEW GRAM POSITIVE COCCI RARE GRAM NEGATIVE RODS    Culture   Final    FEW Consistent with normal respiratory flora. Performed at [facility], [place] [address], [place], [place] [address]    Report Status 03/21/2019 FINAL  Final    Best Practice/Protocols:  VTE Prophylaxis: Lovenox (30mg  subcutaneous q24 hours)   Consults: Treatment Team:  Satira Sark, MD Prescott Gum, Collier Salina, MD   Objective:  Vital signs for  last 24 hours: Temp:  [98.5 F (36.9 C)-99.5 F (37.5 C)] 98.8 F (37.1 C) (02/05 0800) Pulse Rate:  [31-134] 101 (02/05 0700) Resp:  [17-32] 17 (02/05 0700) BP: (87-140)/(45-123) 107/48 (02/05 0700) SpO2:  [88 %-100 %] 99 % (02/05 1117) Arterial Line BP: (72-159)/(35-80) 141/43 (02/05 0700) FiO2 (%):  [40 %] 40 % (02/05 0827) Weight:  [94.6 kg] 94.6 kg (02/05 0500)  Hemodynamic parameters for last 24 hours: CVP:  [13 mmHg] 13 mmHg  Intake/Output from previous day: 02/04 0701 - 02/05 0700 In: 2573.2 [I.V.:1435.4; NG/GT:860; IV Piggyback:277.8] Out: 2790 [Urine:2190; Stool:600]  Intake/Output this shift: Total I/O In: 80 [NG/GT:80] Out: 680 [Urine:580; Stool:100]  Vent settings for last 24 hours: Vent Mode: PSV;CPAP FiO2 (%):  [40 %] 40 % Set Rate:  [20 bmp] 20 bmp Vt Set:  [550 mL] 550 mL PEEP:  [5 cmH20-8 cmH20] 5 cmH20 Pressure Support:  [10 cmH20] 10 cmH20 Plateau Pressure:  [19 cmH20] 19 cmH20  Physical Exam:  General: intubated, responsive to commands Neuro: alert, responds to commands HEENT/Neck: ET WNL Resp: lungs CTAB, tachypneic  CVS: IRR and tachycardic GI: soft, non-tender, mildly distended Skin: no rash, warm & dry Extremities: improving edema in UEs and LEs  Assessment/Plan:  Hypercarbic Hypoxic Respiratory Failure: Likely 2/2 cardiogenic pulmonary edema.  Resp culture consistent with normal respiratory flora.  Pt remains afebrile.  Improvement on CXR 2/4.   -cont ceftriaxone   -cont chest physiotherapy -cont xopenex q6 hours -extubate today   Hypervolemic: CVP 13 today from 11 yesterday.  Net of -274mL yesterday.  Still up 2.5L from admission. -Lasix 80mg  IV bid -metolazone 5mg  oral, check with cardiology  Afib with RVR: -cont amiodarone 30mg /hr IV -cont milrinone, wean levophed -metoprolol 25mg  bid -d/c diltizaem 10mg   AKI: -BUN/Creatinine 78 and 2.05 -avoid nephrotoxic drugs -CTM for now  Anemia: HGB 8.2 this morning -H&H this  afternoon  Hematuria/Dysuria:  -resolved  Critical Care Total Time*: [place], MS4 03/23/2019  *Care during the described time interval was provided by me and/or other providers on the critical care team.  I have reviewed this patient's available data, including medical history, events of note, physical examination and test results as part of my evaluation.

## 2019-03-23 NOTE — Progress Notes (Signed)
[facility] Progress Note Patient Name: Jesse Murphy DOB: 16-Aug-1928 MRN: [id number]   Date of Service  03/23/2019  HPI/Events of Note  ABG on 40%/PRVC 20/TV 550/P 8 = 7.51/36.6/78/26.3. His is overbreathing the set ventilator rate at 29. Options: Sedate vs extubate.   eICU Interventions  Defer decision to extubate vs sedate to rounding team.      Intervention Category Major Interventions: Respiratory failure - evaluation and management;Acid-Base disturbance - evaluation and management  Rosy Estabrook [place] 03/23/2019, 4:56 AM

## 2019-03-23 NOTE — Procedures (Addendum)
Extubation Procedure Note  Patient Details:   Name: Jesse Murphy DOB: [birth year] MRN: [id number]   Airway Documentation:    Vent end date: 03/23/19 Vent end time: 1117   Evaluation  O2 sats: stable throughout Complications: No apparent complications Patient did tolerate procedure well. Bilateral Breath Sounds: Clear, Diminished   Yes  4l/min [place] Incentive spirometer performed Cuff leak positive   Revonda Standard 03/23/2019, 11:18 AM

## 2019-03-23 NOTE — Progress Notes (Signed)
Progress Note  Patient Name: Jesse Murphy Date of Encounter: 03/23/2019  Primary Cardiologist: Kate Sable, MD   Subjective   86 yo with hx of CAD, CABG  Developed respiratory failure and was reintubated   cont to diurese nicely.  He still in atrial fibrillation with rapid ventricular response.  His blood pressure has improved.   Inpatient Medications    Scheduled Meds: . aspirin EC  325 mg Oral Daily   Or  . aspirin  324 mg Per Tube Daily  . atorvastatin  80 mg Oral q1800  . bisacodyl  10 mg Oral Daily   Or  . bisacodyl  10 mg Rectal Daily  . chlorhexidine gluconate (MEDLINE KIT)  15 mL Mouth Rinse BID  . Chlorhexidine Gluconate Cloth  6 each Topical Daily  . docusate  200 mg Oral Daily  . enoxaparin (LOVENOX) injection  40 mg Subcutaneous Q24H  . feeding supplement (PRO-STAT SUGAR FREE 64)  30 mL Per Tube TID  . furosemide  80 mg Intravenous Q12H  . insulin aspart  0-24 Units Subcutaneous Q4H  . levalbuterol  1.25 mg Nebulization Q6H  . lidocaine  5 mL Intradermal Once  . mouth rinse  15 mL Mouth Rinse BID  . mouth rinse  15 mL Mouth Rinse 10 times per day  . metoCLOPramide (REGLAN) injection  5 mg Intravenous Q6H  . metoprolol tartrate  25 mg Per Tube BID  . pantoprazole (PROTONIX) IV  40 mg Intravenous Q24H  . sodium chloride flush  10-40 mL Intracatheter Q12H  . sodium chloride flush  3 mL Intravenous Q12H  . tamsulosin  0.4 mg Oral Daily   Continuous Infusions: . sodium chloride    . sodium chloride    . sodium chloride 10 mL/hr at 03/22/19 2000  . amiodarone 30 mg/hr (03/23/19 0600)  . cefTRIAXone (ROCEPHIN)  IV Stopped (03/22/19 1000)  . dexmedetomidine (PRECEDEX) IV infusion 0.3 mcg/kg/hr (03/23/19 0908)  . diltiazem (CARDIZEM) infusion 5 mg/hr (03/23/19 0810)  . feeding supplement (VITAL AF 1.2 CAL) 50 mL/hr at 03/22/19 2000  . lactated ringers 10 mL/hr at 03/22/19 2000  . milrinone 0.25 mcg/kg/min (03/23/19 0600)  . norepinephrine (LEVOPHED)  Adult infusion 6 mcg/min (03/23/19 0600)  . potassium chloride 10 mEq (03/23/19 0806)   PRN Meds: sodium chloride, dextrose, fentaNYL (SUBLIMAZE) injection, ondansetron (ZOFRAN) IV, oxyCODONE, sodium chloride flush, sodium chloride flush, sodium chloride flush   Vital Signs    Vitals:   03/23/19 0700 03/23/19 0800 03/23/19 0820 03/23/19 0827  BP: (!) 107/48     Pulse: (!) 101     Resp: 17     Temp:  98.8 F (37.1 C)    TempSrc:  Oral    SpO2: 100%  97% 96%  Weight:      Height:        Intake/Output Summary (Last 24 hours) at 03/23/2019 0912 Last data filed at 03/23/2019 0800 Gross per 24 hour  Intake 2238.56 ml  Output 2570 ml  Net -331.44 ml   Last 3 Weights 03/23/2019 03/22/2019 03/21/2019  Weight (lbs) 208 lb 8.9 oz 208 lb 5.4 oz 215 lb 6.2 oz  Weight (kg) 94.6 kg 94.5 kg 97.7 kg      Telemetry    Afib at HR of 120    - Personally Reviewed  ECG     - Personally Reviewed  Physical Exam   Physical Exam: Blood pressure (!) 107/48, pulse (!) 101, temperature 98.8 F (37.1 C), temperature source  Oral, resp. rate 17, height '5\' 8"'  (1.727 m), weight 94.6 kg, SpO2 96 %.  GEN:   Elderly male,  In the vent  HEENT: Normal NECK: No JVD; No carotid bruits LYMPHATICS: No lymphadenopathy CARDIAC: irreg. Irreg.  RESPIRATORY: vent,  Lung sounds are better.  ABDOMEN: Soft, non-tender, non-distended MUSCULOSKELETAL:  No edema; No deformity  SKIN: Warm and dry NEUROLOGIC:  Opened his eyes today when I examined him.  Nodded to questions    Labs    High Sensitivity Troponin:   Recent Labs  Lab 03/13/19 1056 03/13/19 1251 03/13/19 2257 03/14/19 0402  TROPONINIHS 32* 440* 926* 585*      Chemistry Recent Labs  Lab 03/21/19 0208 03/21/19 0400 03/22/19 0401 03/22/19 0943 03/22/19 1540 03/22/19 1540 03/22/19 1718 03/23/19 0350 03/23/19 0408  NA 132*   < > 137   < > 140   < > 138 138 137  K 3.2*   < > 3.5   < > 3.7   < > 3.6 3.7 3.5  CL 96*   < > 98  --  98  --  97*  99  --   CO2 24   < > 25  --  26  --   --  25  --   GLUCOSE 372*   < > 263*  --  197*  --  214* 216*  --   BUN 50*   < > 57*  --  64*  --  57* 78*  --   CREATININE 1.78*   < > 1.68*  --  1.86*  --  1.90* 2.05*  --   CALCIUM 7.1*   < > 7.5*  --  7.9*  --   --  7.8*  --   PROT 5.4*  --  5.7*  --   --   --   --  5.5*  --   ALBUMIN 2.3*   < > 2.3*  --  2.5*  --   --  2.6*  --   AST 59*  --  59*  --   --   --   --  49*  --   ALT 56*  --  55*  --   --   --   --  41  --   ALKPHOS 67  --  75  --   --   --   --  66  --   BILITOT 0.9  --  1.1  --   --   --   --  1.0  --   GFRNONAA 33*   < > 36*  --  32*  --   --  28*  --   GFRAA 39*   < > 41*  --  37*  --   --  33*  --   ANIONGAP 12   < > 14  --  16*  --   --  14  --    < > = values in this interval not displayed.     Hematology Recent Labs  Lab 03/21/19 0208 03/21/19 0400 03/22/19 0401 03/22/19 0943 03/22/19 1718 03/23/19 0350 03/23/19 0408  WBC 13.4*  --  13.3*  --   --  11.5*  --   RBC 3.05*  --  3.12*  --   --  2.87*  --   HGB 9.0*   < > 9.1*   < > 10.2* 8.2* 9.9*  HCT 27.8*   < > 28.3*   < > 30.0*  26.1* 29.0*  MCV 91.1  --  90.7  --   --  90.9  --   MCH 29.5  --  29.2  --   --  28.6  --   MCHC 32.4  --  32.2  --   --  31.4  --   RDW 14.6  --  15.0  --   --  15.2  --   PLT 265  --  288  --   --  301  --    < > = values in this interval not displayed.    BNP Recent Labs  Lab 03/22/19 0401  BNP 340.4*     DDimer No results for input(s): DDIMER in the last 168 hours.   Radiology    DG Chest Port 1 View  Result Date: 03/23/2019 CLINICAL DATA:  Post CABG. EXAM: PORTABLE CHEST 1 VIEW COMPARISON:  03/22/2019 FINDINGS: Endotracheal tube is roughly 2 cm above the carina. Right arm PICC line tip is in the SVC region. Nasogastric tube and feeding tube extend into the abdomen. Postsurgical changes in the chest. Prominent interstitial lung markings are suggestive for mild edema and unchanged. Persistent densities with consolidation  at the left lung base. Cannot exclude small left pleural fluid. Negative for a pneumothorax. IMPRESSION: 1. Support apparatuses are appropriately positioned and minimally changed. 2. Persistent densities at the left lung base are suggestive for consolidation and probable small left pleural effusion. Stable mild interstitial edema. Electronically Signed   By: Markus Daft M.D.   On: 03/23/2019 08:29   DG Chest Port 1 View  Result Date: 03/22/2019 CLINICAL DATA:  Bypass surgery. EXAM: PORTABLE CHEST 1 VIEW COMPARISON:  03/21/2019 FINDINGS: The endotracheal tube, NG tube, feeding tube and right PICC line are stable. Improved lung aeration with resolving left pleural effusion and slight improved left basilar atelectasis. No pulmonary edema or pneumothorax. IMPRESSION: 1. Stable support apparatus. 2. Improving lung aeration with improving left effusion and atelectasis. Electronically Signed   By: Marijo Sanes M.D.   On: 03/22/2019 06:32    Cardiac Studies      Patient Profile     86 y.o. male with CAD .   CHF, Afib    Assessment & Plan    1.  Acute on chronic CHF:  Lungs sound better ,  Continue to diurese   2.  CAD :  S/p CABG    3.  Atrial fib:    Cont amio.  HR remains rapids       For questions or updates, please contact Christine Please consult [email or web address] for contact info under        Signed, Mertie Moores, MD  03/23/2019, 9:12 AM

## 2019-03-24 ENCOUNTER — Inpatient Hospital Stay (HOSPITAL_COMMUNITY): Payer: Medicare Other

## 2019-03-24 DIAGNOSIS — Z951 Presence of aortocoronary bypass graft: Secondary | ICD-10-CM

## 2019-03-24 LAB — POCT I-STAT EG7
Bicarbonate: 25.8 mmol/L (ref 20.0–28.0)
Calcium, Ion: 1.09 mmol/L — ABNORMAL LOW (ref 1.15–1.40)
HCT: 24 % — ABNORMAL LOW (ref 39.0–52.0)
Hemoglobin: 8.2 g/dL — ABNORMAL LOW (ref 13.0–17.0)
O2 Saturation: 48 %
Potassium: 3.4 mmol/L — ABNORMAL LOW (ref 3.5–5.1)
Sodium: 140 mmol/L (ref 135–145)
TCO2: 27 mmol/L (ref 22–32)
pCO2, Ven: 49.6 mmHg (ref 44.0–60.0)
pH, Ven: 7.324 (ref 7.250–7.430)
pO2, Ven: 29 mmHg — CL (ref 32.0–45.0)

## 2019-03-24 LAB — POCT I-STAT 7, (LYTES, BLD GAS, ICA,H+H)
Acid-Base Excess: 3 mmol/L — ABNORMAL HIGH (ref 0.0–2.0)
Acid-Base Excess: 3 mmol/L — ABNORMAL HIGH (ref 0.0–2.0)
Acid-Base Excess: 3 mmol/L — ABNORMAL HIGH (ref 0.0–2.0)
Bicarbonate: 27.7 mmol/L (ref 20.0–28.0)
Bicarbonate: 24.6 mmol/L (ref 20.0–28.0)
Bicarbonate: 28.1 mmol/L — ABNORMAL HIGH (ref 20.0–28.0)
Bicarbonate: 28.8 mmol/L — ABNORMAL HIGH (ref 20.0–28.0)
Calcium, Ion: 1.09 mmol/L — ABNORMAL LOW (ref 1.15–1.40)
Calcium, Ion: 1.1 mmol/L — ABNORMAL LOW (ref 1.15–1.40)
Calcium, Ion: 1.14 mmol/L — ABNORMAL LOW (ref 1.15–1.40)
Calcium, Ion: 1.1 mmol/L — ABNORMAL LOW (ref 1.15–1.40)
HCT: 26 % — ABNORMAL LOW (ref 39.0–52.0)
HCT: 27 % — ABNORMAL LOW (ref 39.0–52.0)
HCT: 26 % — ABNORMAL LOW (ref 39.0–52.0)
HCT: 26 % — ABNORMAL LOW (ref 39.0–52.0)
Hemoglobin: 8.8 g/dL — ABNORMAL LOW (ref 13.0–17.0)
Hemoglobin: 9.2 g/dL — ABNORMAL LOW (ref 13.0–17.0)
Hemoglobin: 8.8 g/dL — ABNORMAL LOW (ref 13.0–17.0)
Hemoglobin: 8.8 g/dL — ABNORMAL LOW (ref 13.0–17.0)
O2 Saturation: 89 %
O2 Saturation: 90 %
O2 Saturation: 91 %
O2 Saturation: 91 %
Patient temperature: 98.6
Patient temperature: 97.7
Patient temperature: 98
Potassium: 3.6 mmol/L (ref 3.5–5.1)
Potassium: 4.2 mmol/L (ref 3.5–5.1)
Potassium: 3.6 mmol/L (ref 3.5–5.1)
Potassium: 3.8 mmol/L (ref 3.5–5.1)
Sodium: 139 mmol/L (ref 135–145)
Sodium: 139 mmol/L (ref 135–145)
Sodium: 139 mmol/L (ref 135–145)
Sodium: 138 mmol/L (ref 135–145)
TCO2: 29 mmol/L (ref 22–32)
TCO2: 26 mmol/L (ref 22–32)
TCO2: 29 mmol/L (ref 22–32)
TCO2: 30 mmol/L (ref 22–32)
pCO2 arterial: 42.9 mmHg (ref 32.0–48.0)
pCO2 arterial: 36.5 mmHg (ref 32.0–48.0)
pCO2 arterial: 43.2 mmHg (ref 32.0–48.0)
pCO2 arterial: 47.1 mmHg (ref 32.0–48.0)
pH, Arterial: 7.418 (ref 7.350–7.450)
pH, Arterial: 7.435 (ref 7.350–7.450)
pH, Arterial: 7.419 (ref 7.350–7.450)
pH, Arterial: 7.394 (ref 7.350–7.450)
pO2, Arterial: 56 mmHg — ABNORMAL LOW (ref 83.0–108.0)
pO2, Arterial: 55 mmHg — ABNORMAL LOW (ref 83.0–108.0)
pO2, Arterial: 59 mmHg — ABNORMAL LOW (ref 83.0–108.0)
pO2, Arterial: 62 mmHg — ABNORMAL LOW (ref 83.0–108.0)

## 2019-03-24 LAB — CBC
HCT: 27.4 % — ABNORMAL LOW (ref 39.0–52.0)
Hemoglobin: 8.6 g/dL — ABNORMAL LOW (ref 13.0–17.0)
MCH: 28.3 pg (ref 26.0–34.0)
MCHC: 31.4 g/dL (ref 30.0–36.0)
MCV: 90.1 fL (ref 80.0–100.0)
Platelets: 348 10*3/uL (ref 150–400)
RBC: 3.04 MIL/uL — ABNORMAL LOW (ref 4.22–5.81)
RDW: 15.5 % (ref 11.5–15.5)
WBC: 14.8 10*3/uL — ABNORMAL HIGH (ref 4.0–10.5)
nRBC: 0.5 % — ABNORMAL HIGH (ref 0.0–0.2)

## 2019-03-24 LAB — COMPREHENSIVE METABOLIC PANEL
ALT: 50 U/L — ABNORMAL HIGH (ref 0–44)
AST: 60 U/L — ABNORMAL HIGH (ref 15–41)
Albumin: 2.6 g/dL — ABNORMAL LOW (ref 3.5–5.0)
Alkaline Phosphatase: 104 U/L (ref 38–126)
Anion gap: 15 (ref 5–15)
BUN: 93 mg/dL — ABNORMAL HIGH (ref 8–23)
CO2: 25 mmol/L (ref 22–32)
Calcium: 8.2 mg/dL — ABNORMAL LOW (ref 8.9–10.3)
Chloride: 99 mmol/L (ref 98–111)
Creatinine, Ser: 2.45 mg/dL — ABNORMAL HIGH (ref 0.61–1.24)
GFR calc Af Amer: 26 mL/min — ABNORMAL LOW (ref >60)
GFR calc non Af Amer: 23 mL/min — ABNORMAL LOW (ref >60)
Glucose, Bld: 239 mg/dL — ABNORMAL HIGH (ref 70–99)
Potassium: 4.1 mmol/L (ref 3.5–5.1)
Sodium: 139 mmol/L (ref 135–145)
Total Bilirubin: 0.5 mg/dL (ref 0.3–1.2)
Total Protein: 5.9 g/dL — ABNORMAL LOW (ref 6.5–8.1)

## 2019-03-24 LAB — COOXEMETRY PANEL
Carboxyhemoglobin: 1.5 % (ref 0.5–1.5)
Carboxyhemoglobin: 1.4 % (ref 0.5–1.5)
Methemoglobin: 1.2 % (ref 0.0–1.5)
Methemoglobin: 1 % (ref 0.0–1.5)
O2 Saturation: 61.9 %
O2 Saturation: 62 %
Total hemoglobin: 8.9 g/dL — ABNORMAL LOW (ref 12.0–16.0)
Total hemoglobin: 8.9 g/dL — ABNORMAL LOW (ref 12.0–16.0)

## 2019-03-24 LAB — GLUCOSE, CAPILLARY
Glucose-Capillary: 149 mg/dL — ABNORMAL HIGH (ref 70–99)
Glucose-Capillary: 154 mg/dL — ABNORMAL HIGH (ref 70–99)
Glucose-Capillary: 165 mg/dL — ABNORMAL HIGH (ref 70–99)
Glucose-Capillary: 173 mg/dL — ABNORMAL HIGH (ref 70–99)
Glucose-Capillary: 209 mg/dL — ABNORMAL HIGH (ref 70–99)
Glucose-Capillary: 219 mg/dL — ABNORMAL HIGH (ref 70–99)

## 2019-03-24 MED ORDER — PANTOPRAZOLE SODIUM 40 MG PO PACK
40.0000 mg | PACK | Freq: Every day | ORAL | Status: DC
  Administered 2019-03-25: 40 mg
  Filled 2019-03-24: qty 20

## 2019-03-24 MED ORDER — METOPROLOL TARTRATE 25 MG PO TABS
25.0000 mg | ORAL_TABLET | Freq: Three times a day (TID) | ORAL | Status: DC
  Administered 2019-03-24 (×2): 25 mg
  Filled 2019-03-24 (×3): qty 1

## 2019-03-24 MED ORDER — LEVALBUTEROL HCL 0.63 MG/3ML IN NEBU
0.6300 mg | INHALATION_SOLUTION | Freq: Four times a day (QID) | RESPIRATORY_TRACT | Status: AC
  Administered 2019-03-24 – 2019-03-26 (×7): 0.63 mg via RESPIRATORY_TRACT
  Filled 2019-03-24 (×8): qty 3

## 2019-03-24 MED ORDER — METOLAZONE 2.5 MG PO TABS
2.5000 mg | ORAL_TABLET | Freq: Every day | ORAL | Status: DC
  Administered 2019-03-24 – 2019-03-25 (×2): 2.5 mg via ORAL
  Filled 2019-03-24 (×2): qty 1

## 2019-03-24 MED ORDER — ENOXAPARIN SODIUM 30 MG/0.3ML ~~LOC~~ SOLN
30.0000 mg | SUBCUTANEOUS | Status: DC
  Administered 2019-03-24: 17:00:00 30 mg via SUBCUTANEOUS
  Filled 2019-03-24: qty 0.3

## 2019-03-24 NOTE — Progress Notes (Signed)
Progress Note  Patient Name: Jesse Murphy Date of Encounter: 03/24/2019  Primary Cardiologist: Kate Sable, MD   Subjective   86 yo with hx of CAD, CABG  Developed respiratory failure and was reintubated   Continues to diurese - HR still elevated in afib on IV amiodarone and pressors.  Inpatient Medications    Scheduled Meds: . aspirin EC  325 mg Oral Daily   Or  . aspirin  324 mg Per Tube Daily  . atorvastatin  80 mg Oral q1800  . bisacodyl  10 mg Oral Daily   Or  . bisacodyl  10 mg Rectal Daily  . chlorhexidine  15 mL Mouth Rinse BID  . Chlorhexidine Gluconate Cloth  6 each Topical Daily  . docusate  200 mg Oral Daily  . enoxaparin (LOVENOX) injection  30 mg Subcutaneous Q24H  . feeding supplement (PRO-STAT SUGAR FREE 64)  30 mL Per Tube TID  . furosemide  80 mg Intravenous Q12H  . insulin aspart  0-24 Units Subcutaneous Q4H  . levalbuterol  0.63 mg Nebulization Q6H  . lidocaine  5 mL Intradermal Once  . mouth rinse  15 mL Mouth Rinse q12n4p  . metoCLOPramide (REGLAN) injection  5 mg Intravenous Q6H  . metolazone  2.5 mg Oral Daily  . metoprolol tartrate  25 mg Per Tube TID  . pantoprazole (PROTONIX) IV  40 mg Intravenous Q24H  . sodium chloride flush  10-40 mL Intracatheter Q12H  . sodium chloride flush  3 mL Intravenous Q12H  . tamsulosin  0.4 mg Oral Daily   Continuous Infusions: . sodium chloride    . sodium chloride    . sodium chloride 10 mL/hr at 03/22/19 2000  . amiodarone 30 mg/hr (03/24/19 1039)  . cefTRIAXone (ROCEPHIN)  IV 200 mL/hr at 03/24/19 1000  . feeding supplement (VITAL AF 1.2 CAL) 1,000 mL (03/23/19 1139)  . lactated ringers 10 mL/hr at 03/22/19 2000  . milrinone 0.125 mcg/kg/min (03/24/19 1000)  . norepinephrine (LEVOPHED) Adult infusion 8 mcg/min (03/24/19 1000)   PRN Meds: sodium chloride, dextrose, fentaNYL (SUBLIMAZE) injection, ondansetron (ZOFRAN) IV, oxyCODONE, sodium chloride flush, sodium chloride flush, sodium chloride  flush   Vital Signs    Vitals:   03/24/19 0645 03/24/19 0700 03/24/19 0753 03/24/19 0839  BP:  (!) 110/51    Pulse: (!) 132 (!) 130    Resp: (!) 25 (!) 27    Temp:    98.7 F (37.1 C)  TempSrc:      SpO2: 97% 93% 97%   Weight:      Height:        Intake/Output Summary (Last 24 hours) at 03/24/2019 1215 Last data filed at 03/24/2019 1000 Gross per 24 hour  Intake 1425.87 ml  Output 2940 ml  Net -1514.13 ml   Last 3 Weights 03/24/2019 03/23/2019 03/22/2019  Weight (lbs) 208 lb 1.8 oz 208 lb 8.9 oz 208 lb 5.4 oz  Weight (kg) 94.4 kg 94.6 kg 94.5 kg      Telemetry    Afib at HR of 110-130    - Personally Reviewed  ECG    N/A  Physical Exam   Physical Exam: Blood pressure (!) 110/51, pulse (!) 130, temperature 98.7 F (37.1 C), resp. rate (!) 27, height 5\' 8"  (1.727 m), weight 94.4 kg, SpO2 97 %.  General appearance: alert, no distress and pale, siting up in the chair Neck: no carotid bruit, no JVD and thyroid not enlarged, symmetric, no tenderness/mass/nodules Lungs: diminished  breath sounds bilaterally Heart: irregularly irregular rhythm and and tachycardic Abdomen: soft, non-tender; bowel sounds normal; no masses,  no organomegaly Extremities: edema 1+ sockline edema Pulses: 2+ and symmetric Skin: pale, cool, dry Neurologic: Mental status: Alert, oriented, thought content appropriate Psych: Pleasant   Labs    High Sensitivity Troponin:   Recent Labs  Lab 03/13/19 1056 03/13/19 1251 03/13/19 2257 03/14/19 0402  TROPONINIHS 32* 440* 926* 585*      Chemistry Recent Labs  Lab 03/22/19 0401 03/22/19 0943 03/22/19 1540 03/22/19 1718 03/23/19 0350 03/23/19 0408 03/23/19 1647 03/23/19 1653 03/23/19 2327 03/24/19 0447 03/24/19 0500  NA 137   < > 140   < > 138   < > 138   < > 139 139 139  K 3.5   < > 3.7   < > 3.7   < > 3.4*   < > 3.6 4.2 4.1  CL 98  --  98   < > 99  --  96*  --   --   --  99  CO2 25  --  26  --  25  --   --   --   --   --  25  GLUCOSE  263*  --  197*   < > 216*  --  191*  --   --   --  239*  BUN 57*  --  64*   < > 78*  --  80*  --   --   --  93*  CREATININE 1.68*  --  1.86*   < > 2.05*  --  2.30*  --   --   --  2.45*  CALCIUM 7.5*  --  7.9*  --  7.8*  --   --   --   --   --  8.2*  PROT 5.7*  --   --   --  5.5*  --   --   --   --   --  5.9*  ALBUMIN 2.3*  --  2.5*  --  2.6*  --   --   --   --   --  2.6*  AST 59*  --   --   --  49*  --   --   --   --   --  60*  ALT 55*  --   --   --  41  --   --   --   --   --  50*  ALKPHOS 75  --   --   --  66  --   --   --   --   --  104  BILITOT 1.1  --   --   --  1.0  --   --   --   --   --  0.5  GFRNONAA 36*  --  32*  --  28*  --   --   --   --   --  23*  GFRAA 41*  --  37*  --  33*  --   --   --   --   --  26*  ANIONGAP 14  --  16*  --  14  --   --   --   --   --  15   < > = values in this interval not displayed.     Hematology Recent Labs  Lab 03/22/19 0401 03/22/19 9373 03/23/19 0350 03/23/19 0408 03/23/19 2327 03/24/19 0447 03/24/19  0500  WBC 13.3*  --  11.5*  --   --   --  14.8*  RBC 3.12*  --  2.87*  --   --   --  3.04*  HGB 9.1*   < > 8.2*   < > 8.8* 9.2* 8.6*  HCT 28.3*   < > 26.1*   < > 26.0* 27.0* 27.4*  MCV 90.7  --  90.9  --   --   --  90.1  MCH 29.2  --  28.6  --   --   --  28.3  MCHC 32.2  --  31.4  --   --   --  31.4  RDW 15.0  --  15.2  --   --   --  15.5  PLT 288  --  301  --   --   --  348   < > = values in this interval not displayed.    BNP Recent Labs  Lab 03/22/19 0401  BNP 340.4*     DDimer No results for input(s): DDIMER in the last 168 hours.   Radiology    DG Chest Port 1 View  Result Date: 03/24/2019 CLINICAL DATA:  Status post CABG. EXAM: PORTABLE CHEST 1 VIEW COMPARISON:  03/23/2019 FINDINGS: CABG changes again noted. An endotracheal tube and NG tube have been removed. A small bore feeding tube entering the UPPER abdomen and RIGHT PICC line with tip overlying the SUPERIOR cavoatrial junction again noted. Pulmonary vascular congestion  with LEFT LOWER lung consolidation/atelectasis, RIGHT basilar atelectasis and probable effusions again noted and may be minimally increased. There is no evidence of pneumothorax. IMPRESSION: Equivocal slight increase in pulmonary vascular congestion, LEFT LOWER lung consolidation/atelectasis, RIGHT basilar atelectasis and probable effusions. Endotracheal tube and NG tube removal. Electronically Signed   By: Margarette Canada M.D.   On: 03/24/2019 06:26   DG Chest Port 1 View  Result Date: 03/23/2019 CLINICAL DATA:  Post CABG. EXAM: PORTABLE CHEST 1 VIEW COMPARISON:  03/22/2019 FINDINGS: Endotracheal tube is roughly 2 cm above the carina. Right arm PICC line tip is in the SVC region. Nasogastric tube and feeding tube extend into the abdomen. Postsurgical changes in the chest. Prominent interstitial lung markings are suggestive for mild edema and unchanged. Persistent densities with consolidation at the left lung base. Cannot exclude small left pleural fluid. Negative for a pneumothorax. IMPRESSION: 1. Support apparatuses are appropriately positioned and minimally changed. 2. Persistent densities at the left lung base are suggestive for consolidation and probable small left pleural effusion. Stable mild interstitial edema. Electronically Signed   By: Markus Daft M.D.   On: 03/23/2019 08:29    Cardiac Studies   N/A  Patient Profile     86 y.o. male with CAD, s/p CABG x 3, CHF, Afib with RVR  Assessment & Plan    1.  Acute on chronic CHF: LVEF 25-30% (03/20/19) - Improving, continue to diurese. CO-OX has been in the 80-90's, now 62 today.  2.  CAD :  S/p CABG  X 3, management per CT surgery.  3.  Atrial fib with RVR:    Cont IV amiodarone. On metoprolol 25 mg per tube TID - consider increase as bp tolerates.    For questions or updates, please contact [place] Please consult [email or web address] for contact info under   Pixie Casino, MD, FACC, [place] Director of  the [facility]  Diplomate of the [employer] of Clinical Lipidology Attending Cardiologist  Direct Dial: [phone]  Fax: [phone]  Website:  [email or web address]  Pixie Casino, MD  03/24/2019, 12:15 PM

## 2019-03-24 NOTE — Progress Notes (Signed)
PT Cancellation Note  Patient Details Name: Jesse Murphy MRN: [id number] DOB: 1928-12-27   Cancelled Treatment:    Reason Eval/Treat Not Completed: Fatigue/lethargy limiting ability to participate. On arrival pt had just transferred back to bed, resting HR of 130 supine in bed. RN reports pt able to stand and transfer using stedy with +2 assist. PT to re-attempt tomorrow.   Lorriane Shire 03/24/2019, 3:05 PM  Lorrin Goodell, PT  Office # [phone] Pager [phone]

## 2019-03-24 NOTE — Progress Notes (Signed)
8 Days Post-Op Procedure(s) (LRB): CORONARY ARTERY BYPASS GRAFTING (CABG), ON PUMP, TIMES THREE, USING LEFT INTERNAL MAMMARY ARTERY, RIGHT GREATER SAPHENOUS VEIN HARVESTED ENDOSCOPICALLY (N/A) MAZE (N/A) CLIPPING OF LEFT  ATRIAL APPENDAGE - USING ATRICLIP SIZE 40 (N/A) TRANSESOPHAGEAL ECHOCARDIOGRAM (TEE) (N/A) Subjective: No complaints  Objective: Vital signs in last 24 hours: Temp:  [97.7 F (36.5 C)-98.6 F (37 C)] 97.7 F (36.5 C) (02/06 0415) Pulse Rate:  [55-138] 130 (02/06 0700) Cardiac Rhythm: Atrial fibrillation (02/06 0400) Resp:  [14-36] 27 (02/06 0700) BP: (89-133)/(48-77) 110/51 (02/06 0700) SpO2:  [93 %-100 %] 97 % (02/06 0753) Arterial Line BP: (78-160)/(30-73) 140/48 (02/06 0645) Weight:  [94.4 kg] 94.4 kg (02/06 0500)  Hemodynamic parameters for last 24 hours: CVP:  [10 mmHg-13 mmHg] 12 mmHg  Intake/Output from previous day: 02/05 0701 - 02/06 0700 In: 2673.9 [I.V.:973.8; NG/GT:1400; IV Piggyback:300] Out: 2840 [Urine:2190; Stool:650] Intake/Output this shift: Total I/O In: 72 [I.V.:72] Out: 300 [Urine:300]  General appearance: alert and cooperative Neurologic: intact Heart: irregularly irregular rhythm Lungs: diminished breath sounds bilaterally Abdomen: soft, non-tender; bowel sounds normal; no masses,  no organomegaly Extremities: edema 1+ Wound: c/d/i  Lab Results: Recent Labs    03/23/19 0350 03/23/19 0408 03/24/19 0447 03/24/19 0500  WBC 11.5*  --   --  14.8*  HGB 8.2*   < > 9.2* 8.6*  HCT 26.1*   < > 27.0* 27.4*  PLT 301  --   --  348   < > = values in this interval not displayed.   BMET:  Recent Labs    03/23/19 0350 03/23/19 0408 03/23/19 1647 03/23/19 1653 03/24/19 0447 03/24/19 0500  NA 138   < > 138   < > 139 139  K 3.7   < > 3.4*   < > 4.2 4.1  CL 99   < > 96*  --   --  99  CO2 25  --   --   --   --  25  GLUCOSE 216*   < > 191*  --   --  239*  BUN 78*   < > 80*  --   --  93*  CREATININE 2.05*   < > 2.30*  --   --   2.45*  CALCIUM 7.8*  --   --   --   --  8.2*   < > = values in this interval not displayed.    PT/INR: No results for input(s): LABPROT, INR in the last 72 hours. ABG    Component Value Date/Time   PHART 7.435 03/24/2019 0447   HCO3 24.6 03/24/2019 0447   TCO2 26 03/24/2019 0447   ACIDBASEDEF 1.0 03/20/2019 1745   O2SAT 61.9 03/24/2019 0450   CBG (last 3)  Recent Labs    03/23/19 2310 03/24/19 0423 03/24/19 0814  GLUCAP 147* 209* 149*    Assessment/Plan: S/P Procedure(s) (LRB): CORONARY ARTERY BYPASS GRAFTING (CABG), ON PUMP, TIMES THREE, USING LEFT INTERNAL MAMMARY ARTERY, RIGHT GREATER SAPHENOUS VEIN HARVESTED ENDOSCOPICALLY (N/A) MAZE (N/A) CLIPPING OF LEFT  ATRIAL APPENDAGE - USING ATRICLIP SIZE 40 (N/A) TRANSESOPHAGEAL ECHOCARDIOGRAM (TEE) (N/A) Mobilize Diuresis aggressive pulmonary toilet   LOS: 10 days    Wonda Olds 03/24/2019

## 2019-03-24 NOTE — Progress Notes (Signed)
TCTS Evening Rounds  Stable day; oob to chair Passed speech/swallow eval PE: BP 127/73   Pulse (!) 127   Temp 99.4 F (37.4 C) (Axillary)   Resp (!) 24   Ht 5\' 8"  (1.727 m)   Wt 94.4 kg   SpO2 98%   BMI 31.64 kg/m   A/O x 3 CTA Irreg/irreg   Intake/Output Summary (Last 24 hours) at 03/24/2019 1824 Last data filed at 03/24/2019 1600 Gross per 24 hour  Intake 1654.35 ml  Output 2785 ml  Net -1130.65 ml   A/P: continue present management Continue supp nutrition Elide Stalzer Z. Orvan Seen, [place]

## 2019-03-24 NOTE — Evaluation (Signed)
Clinical/Bedside Swallow Evaluation Patient Details  Name: Jesse Murphy MRN: [id number] Date of Birth: 1928/09/13  Today's Date: 03/24/2019 Time: SLP Start Time (ACUTE ONLY): 3 SLP Stop Time (ACUTE ONLY): 1100 SLP Time Calculation (min) (ACUTE ONLY): 20 min  Past Medical History:  Past Medical History:  Diagnosis Date  . Atrial fibrillation ([place])   . CAD (coronary artery disease)    a. s/p PCI in 1992 b. Coronary CT in 01/2018 showing extensive coronary calcification; FFR indeterminate --> medical management pursued at that time as patient not interested in repeat cath  . Cancer [facility])    prostate  . Dyspnea   . Hypertension    Past Surgical History:  Past Surgical History:  Procedure Laterality Date  . CLIPPING OF ATRIAL APPENDAGE N/A 03/16/2019   Procedure: CLIPPING OF LEFT  ATRIAL APPENDAGE - USING ATRICLIP SIZE 40;  Surgeon: Ivin Poot, MD;  Location: [place];  Service: Open Heart Surgery;  Laterality: N/A;  . COLONOSCOPY N/A 08/25/2017   Procedure: COLONOSCOPY;  Surgeon: Rogene Houston, MD;  [facility];  Service: Endoscopy;  Laterality: N/A;  . CORONARY ARTERY BYPASS GRAFT N/A 03/16/2019   Procedure: CORONARY ARTERY BYPASS GRAFTING (CABG), ON PUMP, TIMES THREE, USING LEFT INTERNAL MAMMARY ARTERY, RIGHT GREATER SAPHENOUS VEIN HARVESTED ENDOSCOPICALLY;  Surgeon: Ivin Poot, MD;  Location: [place];  Service: Open Heart Surgery;  Laterality: N/A;  swan only  . HERNIA REPAIR    . MAZE N/A 03/16/2019   Procedure: MAZE;  Surgeon: Ivin Poot, MD;  Location: [place];  Service: Open Heart Surgery;  Laterality: N/A;  . PROSTATE SURGERY    . RIGHT/LEFT HEART CATH AND CORONARY ANGIOGRAPHY N/A 03/15/2019   Procedure: RIGHT/LEFT HEART CATH AND CORONARY ANGIOGRAPHY;  Surgeon: Burnell Blanks, MD;  Location: [facility] CV LAB;  Service: Cardiovascular;  Laterality: N/A;  . TEE WITHOUT CARDIOVERSION N/A 03/16/2019   Procedure: TRANSESOPHAGEAL ECHOCARDIOGRAM (TEE);   Surgeon: Prescott Gum, Collier Salina, MD;  Location: [place];  Service: Open Heart Surgery;  Laterality: N/A;   HPI:  Patient is an 86 y.o. male with PMH: h/o CAD s/p CABG and clipping of left atrial appendage, who presented with SOB on 2/1. He became hypoxic on 2/2 with pulmonary edema and needed to be intubated on 2/2 and extubated 2/5.   Assessment / Plan / Recommendation Clinical Impression  Patient presents with a mild pharyngeal dysphagia with mild swallow initiation delays with thin liquids and delayed throat clearing and coughing, but with voice strong and clear and remaining clear throughout. Unable to differentiate, however suspect that coughing and throat clearing were due to pharyngeal secretions as patient was throat clearing prior to PO's and he endorses hocking up thick whitish secretions at home. SLP Visit Diagnosis: Dysphagia, unspecified (R13.10)    Aspiration Risk  Mild aspiration risk    Diet Recommendation Dysphagia 3 (Mech soft);Thin liquid   Liquid Administration via: Cup;Straw Medication Administration: Whole meds with puree Supervision: Patient able to self feed;Staff to assist with self feeding Compensations: Minimize environmental distractions;Slow rate;Small sips/bites Postural Changes: Seated upright at 90 degrees    Other  Recommendations Oral Care Recommendations: Oral care BID   Follow up Recommendations None      Frequency and Duration min 2x/week  1 week       Prognosis Prognosis for Safe Diet Advancement: Good      Swallow Study   General Date of Onset: 03/19/19 HPI: Patient is an 86 y.o. male with PMH:  h/o CAD s/p CABG and clipping of left atrial appendage, who presented with SOB on 2/1. He became hypoxic on 2/2 with pulmonary edema and needed to be intubated on 2/2 and extubated 2/5. Type of Study: Bedside Swallow Evaluation Previous Swallow Assessment: N/A Diet Prior to this Study: NPO Temperature Spikes Noted: No Respiratory Status: Nasal  cannula History of Recent Intubation: Yes Length of Intubations (days): 4 days Date extubated: 03/23/19 Behavior/Cognition: Alert;Cooperative;Pleasant mood Oral Cavity Assessment: Within Functional Limits Oral Care Completed by SLP: Yes Oral Cavity - Dentition: Edentulous Vision: Functional for self-feeding Self-Feeding Abilities: Needs assist;Needs set up Patient Positioning: Upright in bed;Postural control adequate for testing Baseline Vocal Quality: Normal Volitional Cough: Strong Volitional Swallow: Able to elicit    Oral/Motor/Sensory Function Overall Oral Motor/Sensory Function: Within functional limits   Ice Chips     Thin Liquid Thin Liquid: Impaired Presentation: Straw;Self Fed Pharyngeal  Phase Impairments: Suspected delayed Swallow;Throat Clearing - Delayed Other Comments: Patient with some delayed throat clearing and coughing however this appeared to be from pharyngeal secretions as he was observed to be throat clearing and coughing prior to PO's    Nectar Thick     Honey Thick     Puree Puree: Within functional limits   Solid     Solid: Not tested      Dannial Monarch 03/24/2019,5:39 PM    Sonia Baller, MA, CCC-SLP Speech Therapy [facility] Pager: [phone]

## 2019-03-24 NOTE — Progress Notes (Signed)
Patient was NTS'd at this time due to rattling and an ineffective cough to clear secretions at this time. Obtained a moderate amount of thick, white/clear secretions. Patients voice stronger and more clear after suctioning. Vitals stable throughout procedure. RN at bedside.

## 2019-03-24 NOTE — Progress Notes (Signed)
Daily Progress Note  Patient Details:    Jesse Murphy is an 86 y.o. male with a h/o CAD, afib, s/p CABG and clipping of left atrial appendage p/w shortness of breath on 2/1.  Patient became hypoxic 2/2 pulm edema and needed to be intubated on 2/2. Extubated 2/5.    PMHx: CAD, afib  Significant Hospital Events:  1/29 CABG + clipping left atrial appendage 2/1 transfer to CVICU 2/2 SOB 2/2 intubated 2/5 extubated  Significant Diagnostic Tests:  2/5 CXR shows Persistent densities at the left lung base are suggestive for consolidation and probable small left pleural effusion. Stable mild interstitial edema. 2/4 CXR shows improving lung aeration with improving left effusion and atelectasis. 2/3 CXR shows mild improvement in pulm effusion, edema 2/2 CXR shows pulm edema 2/1 CXR shows likely pulm edema 1/29 Intraoperative Echo, EF of 45% 1/26 Preop Echo, EF of 30-35%  Micro Data: 2/1 sputum sent, culture consistent w/ normal resp flora 1/26 MRSA PCR screening negative 1/26 Covid negative  Antimicrobials: Ceftriaxone 2/1 -   Interim Hx/ Subjective: Successfully extubated yesterday.  Reports improving strength.  Denies pain.  Was mobilized to chair this morning.  Lines, Airways, Drains: Introducer Double Lumen 03/16/19 Internal Jugular Right (Active)  Site Assessment Clean;Dry;Intact 03/19/19 0800  Proximal Lumen Status Capped 03/19/19 0800  Distal Lumen Status Infusing 03/19/19 0800  Dressing Type Transparent;Occlusive 03/19/19 0800  Dressing Status Clean;Dry;Intact;Antimicrobial disc in place 03/19/19 0800  Dressing Interventions Dressing changed 03/19/19 0800  Dressing Change Due 03/26/19 03/19/19 0800     External Urinary Catheter (Active)  Collection Container Standard drainage bag 03/19/19 0800  Securement Method Securing device (Describe) 03/19/19 0800  Site Assessment Clean;Intact;Dry 03/19/19 0800  Output (mL) 0 mL 03/19/19 1500    Anti-infectives:    Anti-infectives (From admission, onward)   Start     Dose/Rate Route Frequency Ordered Stop   03/19/19 1000  cefTRIAXone (ROCEPHIN) 1 g in sodium chloride 0.9 % 100 mL IVPB     1 g 200 mL/hr over 30 Minutes Intravenous Daily 03/19/19 0810 03/26/19 0959   03/16/19 2230  vancomycin (VANCOCIN) IVPB 1000 mg/200 mL premix     1,000 mg 200 mL/hr over 60 Minutes Intravenous  Once 03/16/19 1721 03/17/19 0024   03/16/19 1830  cefUROXime (ZINACEF) 1.5 g in sodium chloride 0.9 % 100 mL IVPB     1.5 g 200 mL/hr over 30 Minutes Intravenous Every 12 hours 03/16/19 1721 03/18/19 0557   03/16/19 0400  vancomycin (VANCOREADY) IVPB 1500 mg/300 mL  Status:  Discontinued     1,500 mg 150 mL/hr over 120 Minutes Intravenous To Surgery 03/15/19 1425 03/16/19 1715   03/16/19 0400  cefUROXime (ZINACEF) 1.5 g in sodium chloride 0.9 % 100 mL IVPB  Status:  Discontinued     1.5 g 200 mL/hr over 30 Minutes Intravenous To Surgery 03/15/19 1425 03/16/19 1715   03/16/19 0400  cefUROXime (ZINACEF) 750 mg in sodium chloride 0.9 % 100 mL IVPB     750 mg 200 mL/hr over 30 Minutes Intravenous To Surgery 03/15/19 1425 03/16/19 1540      Microbiology: Results for orders placed or performed during the hospital encounter of 03/13/19  Respiratory Panel by RT PCR (Flu A&B, Covid) - Nasopharyngeal Swab     Status: None   Collection Time: 03/13/19  1:49 PM   Specimen: Nasopharyngeal Swab  Result Value Ref Range Status   SARS Coronavirus 2 by RT PCR NEGATIVE NEGATIVE Final    Comment: (NOTE)  SARS-CoV-2 target nucleic acids are NOT DETECTED. The SARS-CoV-2 RNA is generally detectable in upper respiratoy specimens during the acute phase of infection. The lowest concentration of SARS-CoV-2 viral copies this assay can detect is 131 copies/mL. A negative result does not preclude SARS-Cov-2 infection and should not be used as the sole basis for treatment or other patient management decisions. A negative result may occur with   improper specimen collection/handling, submission of specimen other than nasopharyngeal swab, presence of viral mutation(s) within the areas targeted by this assay, and inadequate number of viral copies (<131 copies/mL). A negative result must be combined with clinical observations, patient history, and epidemiological information. The expected result is Negative. Fact Sheet for Patients:  PinkCheek.be Fact Sheet for Healthcare Providers:  GravelBags.it This test is not yet ap proved or cleared by the [place] FDA and  has been authorized for detection and/or diagnosis of SARS-CoV-2 by FDA under an Emergency Use Authorization (EUA). This EUA will remain  in effect (meaning this test can be used) for the duration of the COVID-19 declaration under Section 564(b)(1) of the Act, 21 U.S.C. section 360bbb-3(b)(1), unless the authorization is terminated or revoked sooner.    Influenza A by PCR NEGATIVE NEGATIVE Final   Influenza B by PCR NEGATIVE NEGATIVE Final    Comment: (NOTE) The Xpert Xpress SARS-CoV-2/FLU/RSV assay is intended as an aid in  the diagnosis of influenza from Nasopharyngeal swab specimens and  should not be used as a sole basis for treatment. Nasal washings and  aspirates are unacceptable for Xpert Xpress SARS-CoV-2/FLU/RSV  testing. Fact Sheet for Patients: PinkCheek.be Fact Sheet for Healthcare Providers: GravelBags.it This test is not yet approved or cleared by the [place] FDA and  has been authorized for detection and/or diagnosis of SARS-CoV-2 by  FDA under an Emergency Use Authorization (EUA). This EUA will remain  in effect (meaning this test can be used) for the duration of the  Covid-19 declaration under Section 564(b)(1) of the Act, 21  U.S.C. section 360bbb-3(b)(1), unless the authorization is  terminated or revoked. Performed at  [facility], [address], [place], [place] [address]   MRSA PCR Screening     Status: None   Collection Time: 03/13/19  5:18 PM   Specimen: Nasal Mucosa; Nasopharyngeal  Result Value Ref Range Status   MRSA by PCR NEGATIVE NEGATIVE Final    Comment:        The GeneXpert MRSA Assay (FDA approved for NASAL specimens only), is one component of a comprehensive MRSA colonization surveillance program. It is not intended to diagnose MRSA infection nor to guide or monitor treatment for MRSA infections. Performed at [facility], [address], [place], [place] [address]   Surgical pcr screen     Status: None   Collection Time: 03/15/19  9:28 PM   Specimen: Nasal Mucosa; Nasal Swab  Result Value Ref Range Status   MRSA, PCR NEGATIVE NEGATIVE Final   Staphylococcus aureus NEGATIVE NEGATIVE Final    Comment: (NOTE) The Xpert SA Assay (FDA approved for NASAL specimens in patients 78 years of age and older), is one component of a comprehensive surveillance program. It is not intended to diagnose infection nor to guide or monitor treatment. Performed at [facility], [place] [address], [place], [place] [address]   Culture, respiratory (non-expectorated)     Status: None   Collection Time: 03/19/19  3:00 PM   Specimen: Tracheal Aspirate; Respiratory  Result Value Ref Range Status   Specimen  Description TRACHEAL ASPIRATE  Final   Special Requests Normal  Final   Gram Stain   Final    ABUNDANT WBC PRESENT,BOTH PMN AND MONONUCLEAR FEW GRAM POSITIVE COCCI RARE GRAM NEGATIVE RODS    Culture   Final    FEW Consistent with normal respiratory flora. Performed at [facility], [place] [address] [place], [place] [address]    Report Status 03/21/2019 FINAL  Final    Objective:  Vital signs for last 24 hours: Temp:  [97.7 F (36.5 C)-98.7 F (37.1 C)] 98 F (36.7 C) (02/06 1211) Pulse Rate:  [55-138] 130 (02/06 0700) Resp:  [20-36] 27 (02/06 0700) BP: (91-133)/(49-64) 110/51  (02/06 0700) SpO2:  [93 %-99 %] 97 % (02/06 0753) Arterial Line BP: (78-160)/(33-73) 140/48 (02/06 0645) Weight:  [94.4 kg] 94.4 kg (02/06 0500)  Hemodynamic parameters for last 24 hours: CVP:  [10 mmHg-12 mmHg] 12 mmHg  Intake/Output from previous day: 02/05 0701 - 02/06 0700 In: 2673.9 [I.V.:973.8; NG/GT:1400; IV Piggyback:300] Out: 2840 [Urine:2190; Stool:650]  Intake/Output this shift: Total I/O In: 160.4 [I.V.:139.3; IV Piggyback:21.1] Out: 650 [Urine:650]   Physical Exam:  General: Awake extubated lying in bed on no sedative infusions. Neuro: alert, responds to commands.  Speech is clearer than yesterday.  Continues to have generalized weakness but improved from yesterday. HEENT/Neck: Better neck control. Resp: Tachypnea with mild accessory muscle use crackles at bases only. CVS:  irregularly irregular rhythm, JVP not visualized.  Apex beat is diffuse and sustained.  S1-S2 unremarkable.  No gallop. GI: soft, non-tender, mildly distended Skin: no rash, warm & dry Extremities: improving edema in UEs and LEs  Assessment/Plan:  Was critically ill due to hypercarbic Hypoxic Respiratory Failure secondary to cardiogenic pulmonary edema.  Now tolerating extubation with improving oxygen requirements. -Progressive mobilization -Encourage incentive spirometry   Remains critically ill due to cardiogenic shock requiring titration of inotropes and vasopressors secondary to HFrEF. Acceptable SCV O2.  Suspect milrinone, norepinephrine and amiodarone may be now working at cross purposes -We will wean milrinone and recheck oximetry. -Wean norepinephrine to off titrating to keep systolic blood pressure greater than 120. -Remains volume overloaded-continue diuresis with intermittent furosemide augmented by metolazone.   Critically ill due to atrial fibrillation with rapid ventricular response -Increase metoprolol to 25 3 times daily -Continue IV amiodarone and transition to oral  amiodarone once heart rate consistently less than 100.  AKI: -BUN/Creatinine 78 and 2.05 -avoid nephrotoxic drugs  Anemia: HGB 8.2 this morning -Continue to monitor  Hematuria/Dysuria:  -resolved   Daily Goals Checklist  Pain/Anxiety/Delirium protocol (if indicated): Oxycodone as needed Respiratory support goals: Wean O2 as tolerated.  Encourage incentive spirometry.  Progressive mobilization. Blood pressure target: Wean norepinephrine to keep systolic blood pressure greater then 120 DVT prophylaxis: Lovenox 30 mg daily Nutrition Status: Nutrition Problem: Increased nutrient needs Etiology: post-op healing Signs/Symptoms: estimated needs Interventions: Tube feeding, Prostat GI prophylaxis: No longer indicated Fluid status goals: Continue diuresis until resolution of clinical edema. Urinary catheter: Required for retention and edema. Central lines: Continue current central lines until off vasoactive agents Glucose control: We will increase tube feed coverage. Mobility/therapy needs: Progressive ambulation PT consult, SLP consult Antibiotic de-escalation: Complete 7 days antibiotic for suspected pneumonia Home medication reconciliation: Resuming home metoprolol, continuing home Protonix.  Restart home Flomax once able to swallow. Daily labs: BMP daily Code Status: Full code Family Communication: Updated by cardiac surgery Disposition: ICU until infusions discontinued.  CRITICAL CARE Performed by: Kipp Brood   Total critical care  time: 40 minutes  Critical care time was exclusive of separately billable procedures and treating other patients.  Critical care was necessary to treat or prevent imminent or life-threatening deterioration.  Critical care was time spent personally by me on the following activities: development of treatment plan with patient and/or surrogate as well as nursing, discussions with consultants, evaluation of patient's response to treatment,  examination of patient, obtaining history from patient or surrogate, ordering and performing treatments and interventions, ordering and review of laboratory studies, ordering and review of radiographic studies, pulse oximetry, re-evaluation of patient's condition and participation in multidisciplinary rounds.  Kipp Brood, MD [facility] ICU Physician [place]  Pager: [phone] Mobile: [phone] After hours: [phone].

## 2019-03-25 ENCOUNTER — Inpatient Hospital Stay (HOSPITAL_COMMUNITY): Payer: Medicare Other

## 2019-03-25 DIAGNOSIS — L8990 Pressure ulcer of unspecified site, unspecified stage: Secondary | ICD-10-CM | POA: Insufficient documentation

## 2019-03-25 LAB — COMPREHENSIVE METABOLIC PANEL
ALT: 65 U/L — ABNORMAL HIGH (ref 0–44)
AST: 83 U/L — ABNORMAL HIGH (ref 15–41)
Albumin: 2.4 g/dL — ABNORMAL LOW (ref 3.5–5.0)
Alkaline Phosphatase: 67 U/L (ref 38–126)
Anion gap: 11 (ref 5–15)
BUN: 110 mg/dL — ABNORMAL HIGH (ref 8–23)
CO2: 27 mmol/L (ref 22–32)
Calcium: 7.9 mg/dL — ABNORMAL LOW (ref 8.9–10.3)
Chloride: 98 mmol/L (ref 98–111)
Creatinine, Ser: 2.63 mg/dL — ABNORMAL HIGH (ref 0.61–1.24)
GFR calc Af Amer: 24 mL/min — ABNORMAL LOW (ref >60)
GFR calc non Af Amer: 21 mL/min — ABNORMAL LOW (ref >60)
Glucose, Bld: 228 mg/dL — ABNORMAL HIGH (ref 70–99)
Potassium: 3.7 mmol/L (ref 3.5–5.1)
Sodium: 136 mmol/L (ref 135–145)
Total Bilirubin: 0.7 mg/dL (ref 0.3–1.2)
Total Protein: 6 g/dL — ABNORMAL LOW (ref 6.5–8.1)

## 2019-03-25 LAB — CBC
HCT: 28 % — ABNORMAL LOW (ref 39.0–52.0)
Hemoglobin: 8.5 g/dL — ABNORMAL LOW (ref 13.0–17.0)
MCH: 28.5 pg (ref 26.0–34.0)
MCHC: 30.4 g/dL (ref 30.0–36.0)
MCV: 94 fL (ref 80.0–100.0)
Platelets: 315 10*3/uL (ref 150–400)
RBC: 2.98 MIL/uL — ABNORMAL LOW (ref 4.22–5.81)
RDW: 15.8 % — ABNORMAL HIGH (ref 11.5–15.5)
WBC: 13.2 10*3/uL — ABNORMAL HIGH (ref 4.0–10.5)
nRBC: 0.3 % — ABNORMAL HIGH (ref 0.0–0.2)

## 2019-03-25 LAB — POCT I-STAT 7, (LYTES, BLD GAS, ICA,H+H)
Acid-Base Excess: 2 mmol/L (ref 0.0–2.0)
Bicarbonate: 26.4 mmol/L (ref 20.0–28.0)
Calcium, Ion: 1.03 mmol/L — ABNORMAL LOW (ref 1.15–1.40)
HCT: 25 % — ABNORMAL LOW (ref 39.0–52.0)
Hemoglobin: 8.5 g/dL — ABNORMAL LOW (ref 13.0–17.0)
O2 Saturation: 90 %
Potassium: 3.8 mmol/L (ref 3.5–5.1)
Sodium: 139 mmol/L (ref 135–145)
TCO2: 28 mmol/L (ref 22–32)
pCO2 arterial: 38.8 mmHg (ref 32.0–48.0)
pH, Arterial: 7.441 (ref 7.350–7.450)
pO2, Arterial: 57 mmHg — ABNORMAL LOW (ref 83.0–108.0)

## 2019-03-25 LAB — COOXEMETRY PANEL
Carboxyhemoglobin: 1.3 % (ref 0.5–1.5)
Carboxyhemoglobin: 1.5 % (ref 0.5–1.5)
Methemoglobin: 1.1 % (ref 0.0–1.5)
Methemoglobin: 0.9 % (ref 0.0–1.5)
O2 Saturation: 60 %
O2 Saturation: 95.6 %
Total hemoglobin: 10.4 g/dL — ABNORMAL LOW (ref 12.0–16.0)
Total hemoglobin: 8.3 g/dL — ABNORMAL LOW (ref 12.0–16.0)

## 2019-03-25 LAB — GLUCOSE, CAPILLARY
Glucose-Capillary: 138 mg/dL — ABNORMAL HIGH (ref 70–99)
Glucose-Capillary: 147 mg/dL — ABNORMAL HIGH (ref 70–99)
Glucose-Capillary: 151 mg/dL — ABNORMAL HIGH (ref 70–99)
Glucose-Capillary: 161 mg/dL — ABNORMAL HIGH (ref 70–99)
Glucose-Capillary: 169 mg/dL — ABNORMAL HIGH (ref 70–99)
Glucose-Capillary: 170 mg/dL — ABNORMAL HIGH (ref 70–99)
Glucose-Capillary: 186 mg/dL — ABNORMAL HIGH (ref 70–99)

## 2019-03-25 MED ORDER — HEPARIN SODIUM (PORCINE) 5000 UNIT/ML IJ SOLN
5000.0000 [IU] | Freq: Two times a day (BID) | INTRAMUSCULAR | Status: DC
  Administered 2019-03-25 – 2019-03-26 (×3): 5000 [IU] via SUBCUTANEOUS
  Filled 2019-03-25 (×3): qty 1

## 2019-03-25 MED ORDER — FUROSEMIDE 10 MG/ML IJ SOLN
80.0000 mg | Freq: Once | INTRAMUSCULAR | Status: AC
  Administered 2019-03-25: 80 mg via INTRAVENOUS
  Filled 2019-03-25: qty 8

## 2019-03-25 MED ORDER — INSULIN GLARGINE 100 UNIT/ML ~~LOC~~ SOLN
14.0000 [IU] | Freq: Two times a day (BID) | SUBCUTANEOUS | Status: DC
  Administered 2019-03-25 (×2): 14 [IU] via SUBCUTANEOUS
  Filled 2019-03-25 (×4): qty 0.14

## 2019-03-25 MED ORDER — METOPROLOL TARTRATE 25 MG PO TABS
25.0000 mg | ORAL_TABLET | Freq: Three times a day (TID) | ORAL | Status: DC
  Administered 2019-03-25 (×3): 25 mg
  Filled 2019-03-25 (×3): qty 1

## 2019-03-25 MED ORDER — PSYLLIUM 95 % PO PACK
1.0000 | PACK | Freq: Two times a day (BID) | ORAL | Status: DC
  Administered 2019-03-25 – 2019-03-30 (×11): 1
  Filled 2019-03-25 (×12): qty 1

## 2019-03-25 MED ORDER — AMIODARONE HCL 200 MG PO TABS
200.0000 mg | ORAL_TABLET | Freq: Two times a day (BID) | ORAL | Status: DC
  Administered 2019-03-25 (×2): 200 mg via ORAL
  Filled 2019-03-25 (×2): qty 1

## 2019-03-25 MED ORDER — METOPROLOL TARTRATE 25 MG PO TABS: 25.0000 mg | ORAL_TABLET | Freq: Two times a day (BID) | ORAL | Status: DC

## 2019-03-25 NOTE — Evaluation (Signed)
Physical Therapy Evaluation Patient Details Name: Jesse Murphy MRN: [id number] DOB: Nov 17, 1928 Today's Date: 03/25/2019   History of Present Illness  86 y.o. male with PMH: h/o CAD s/p CABG and clipping of left atrial appendage, who presented with SOB on 2/1. He became hypoxic on 2/2 with pulmonary edema and needed to be intubated on 2/2 and extubated 2/5.    Clinical Impression  Pt admitted with above diagnosis. PTA pt independent. On eval, he required mod assist bed mobility, and +2 min/mod assist sit to stand in stedy. Stedy utilized for bed to recliner transfer. Pt presents with decreased strength, balance and activity tolerance.  Pt currently with functional limitations due to the deficits listed below (see PT Problem List). Pt will benefit from skilled PT to increase their independence and safety with mobility to allow discharge to the venue listed below.       Follow Up Recommendations CIR    Equipment Recommendations  Other (comment)(TBD)    Recommendations for Other Services Rehab consult     Precautions / Restrictions Precautions Precautions: Fall;Sternal;Other (comment) Precaution Comments: educated pt on sternal precautions. Multi lines: rectal tube, coretrak, a line      Mobility  Bed Mobility Overal bed mobility: Needs Assistance Bed Mobility: Supine to Sit     Supine to sit: Mod assist;HOB elevated     General bed mobility comments: assist with BLE OOB and to elevate trunk, cues for sequencing and sternal precautions  Transfers Overall transfer level: Needs assistance   Transfers: Sit to/from Stand Sit to Stand: +2 physical assistance;Min assist;Mod assist         General transfer comment: assist to power up, cues for sternal precautions, stedy for bed to recliner transfers. Pt with good power up during both standing trials in stedy. Stedy utilized for safety due to [employer]  and poor activity tolerance.  Ambulation/Gait             General  Gait Details: unable  Stairs            Wheelchair Mobility    Modified Rankin (Stroke Patients Only)       Balance Overall balance assessment: Needs assistance Sitting-balance support: No upper extremity supported;Feet supported Sitting balance-Leahy Scale: Good     Standing balance support: Bilateral upper extremity supported Standing balance-Leahy Scale: Poor Standing balance comment: reliant on external support                             Pertinent Vitals/Pain Pain Assessment: Faces Faces Pain Scale: Hurts little more Pain Location: generalized with mobility Pain Descriptors / Indicators: Grimacing;Discomfort Pain Intervention(s): Limited activity within patient's tolerance;Monitored during session;Repositioned    Home Living Family/patient expects to be discharged to:: Private residence Living Arrangements: Spouse/significant other Available Help at Discharge: Family;Available 24 hours/day Type of Home: House Home Access: Stairs to enter   [employer] of Steps: 4 Home Layout: One level Home Equipment: Other (comment)(lift chair)      Prior Function Level of Independence: Independent               Hand Dominance        Extremity/Trunk Assessment   Upper Extremity Assessment Upper Extremity Assessment: Generalized weakness    Lower Extremity Assessment Lower Extremity Assessment: Generalized weakness    Cervical / Trunk Assessment Cervical / Trunk Assessment: Normal  Communication   Communication: No difficulties  Cognition Arousal/Alertness: Awake/alert Behavior During Therapy: WFL for tasks assessed/performed  Overall Cognitive Status: Within Functional Limits for tasks assessed                                        General Comments General comments (skin integrity, edema, etc.): Pt on 8 L HFNC.    Exercises     Assessment/Plan    PT Assessment Patient needs continued PT services  PT  Problem List Decreased strength;Decreased mobility;Decreased knowledge of precautions;Decreased activity tolerance;Cardiopulmonary status limiting activity;Pain;Decreased balance;Decreased knowledge of use of DME       PT Treatment Interventions DME instruction;Therapeutic activities;Gait training;Therapeutic exercise;Patient/family education;Balance training;Stair training;Functional mobility training    PT Goals (Current goals can be found in the Care Plan section)  Acute Rehab PT Goals Patient Stated Goal: home PT Goal Formulation: With patient Time For Goal Achievement: 04/08/19 Potential to Achieve Goals: Good    Frequency Min 3X/week   Barriers to discharge        Co-evaluation               AM-PAC PT "6 Clicks" Mobility  Outcome Measure Help needed turning from your back to your side while in a flat bed without using bedrails?: A Lot Help needed moving from lying on your back to sitting on the side of a flat bed without using bedrails?: A Lot Help needed moving to and from a bed to a chair (including a wheelchair)?: A Lot Help needed standing up from a chair using your arms (e.g., wheelchair or bedside chair)?: A Little Help needed to walk in hospital room?: A Lot Help needed climbing 3-5 steps with a railing? : Total 6 Click Score: 12    End of Session Equipment Utilized During Treatment: Oxygen Activity Tolerance: Patient tolerated treatment well Patient left: in chair;with call bell/phone within reach;with nursing/sitter in room Nurse Communication: Mobility status;Need for lift equipment PT Visit Diagnosis: Other abnormalities of gait and mobility (R26.89);Muscle weakness (generalized) (M62.81)    Time: 0539-7673 PT Time Calculation (min) (ACUTE ONLY): 18 min   Charges:   PT Evaluation $PT Eval Moderate Complexity: 1 Mod          Lorrin Goodell, PT  Office # [phone] Pager [phone]   Lorriane Shire 03/25/2019, 12:54 PM

## 2019-03-25 NOTE — Progress Notes (Addendum)
Daily Progress Note  Patient Details:    Jesse Murphy is an 86 y.o. male with a h/o CAD, afib, s/p CABG and clipping of left atrial appendage p/w shortness of breath on 2/1.  Patient became hypoxic 2/2 pulm edema and needed to be intubated on 2/2. Extubated 2/5.    PMHx: CAD, afib  Significant Hospital Events:  1/29 CABG + clipping left atrial appendage 2/1 transfer to CVICU 2/2 SOB 2/2 intubated 2/5 extubated  Significant Diagnostic Tests:  2/5 CXR shows Persistent densities at the left lung base are suggestive for consolidation and probable small left pleural effusion. Stable mild interstitial edema. 2/4 CXR shows improving lung aeration with improving left effusion and atelectasis. 2/3 CXR shows mild improvement in pulm effusion, edema 2/2 CXR shows pulm edema 2/1 CXR shows likely pulm edema 1/29 Intraoperative Echo, EF of 45% 1/26 Preop Echo, EF of 30-35%  Micro Data: 2/1 sputum sent, culture consistent w/ normal resp flora 1/26 MRSA PCR screening negative 1/26 Covid negative  Antimicrobials: Ceftriaxone 2/1 -   Interim Hx/ Subjective: Successfully extubated yesterday.  Reports improving strength.  Denies pain.  Was mobilized to chair this morning.  Lines, Airways, Drains: Drain  Urethral Catheter Cyd Silence, RN 16 Fr. 5 days  Rectal Tube/Pouch 3 days  Wound  Incision (Closed) 03/16/19 Chest Other (Comment) 8 days  Incision (Closed) 03/16/19 Leg Right 8 days  Pressure Injury 03/24/19 Vertebral column Posterior;Mid;Upper Stage 1 - Intact skin with non-blanchable redness of a localized area usually over a bony prominence. 2x2 stage 1 pressure injury (reddened) to vertebral column area. foam applied 1 day  PICC Line  PICC Triple Lumen 63/87/56 PICC Right Basilic 43 cm 0 cm 4 days  ART Line  Arterial Line 03/21/19 Left Radial 4 days  NG/OG Tube  Nasoenteric Feeding Tube [name] - 43 inches 10 Fr. Left nare Documented cm marking at nare/ corner of mouth 98  cm      Anti-infectives:  Anti-infectives (From admission, onward)   Start     Dose/Rate Route Frequency Ordered Stop   03/19/19 1000  cefTRIAXone (ROCEPHIN) 1 g in sodium chloride 0.9 % 100 mL IVPB     1 g 200 mL/hr over 30 Minutes Intravenous Daily 03/19/19 0810 03/25/19 1117   03/16/19 2230  vancomycin (VANCOCIN) IVPB 1000 mg/200 mL premix     1,000 mg 200 mL/hr over 60 Minutes Intravenous  Once 03/16/19 1721 03/17/19 0024   03/16/19 1830  cefUROXime (ZINACEF) 1.5 g in sodium chloride 0.9 % 100 mL IVPB     1.5 g 200 mL/hr over 30 Minutes Intravenous Every 12 hours 03/16/19 1721 03/18/19 0557   03/16/19 0400  vancomycin (VANCOREADY) IVPB 1500 mg/300 mL  Status:  Discontinued     1,500 mg 150 mL/hr over 120 Minutes Intravenous To Surgery 03/15/19 1425 03/16/19 1715   03/16/19 0400  cefUROXime (ZINACEF) 1.5 g in sodium chloride 0.9 % 100 mL IVPB  Status:  Discontinued     1.5 g 200 mL/hr over 30 Minutes Intravenous To Surgery 03/15/19 1425 03/16/19 1715   03/16/19 0400  cefUROXime (ZINACEF) 750 mg in sodium chloride 0.9 % 100 mL IVPB     750 mg 200 mL/hr over 30 Minutes Intravenous To Surgery 03/15/19 1425 03/16/19 1540      Microbiology: Results for orders placed or performed during the hospital encounter of 03/13/19  Respiratory Panel by RT PCR (Flu A&B, Covid) - Nasopharyngeal Swab     Status: None  Collection Time: 03/13/19  1:49 PM   Specimen: Nasopharyngeal Swab  Result Value Ref Range Status   SARS Coronavirus 2 by RT PCR NEGATIVE NEGATIVE Final    Comment: (NOTE) SARS-CoV-2 target nucleic acids are NOT DETECTED. The SARS-CoV-2 RNA is generally detectable in upper respiratoy specimens during the acute phase of infection. The lowest concentration of SARS-CoV-2 viral copies this assay can detect is 131 copies/mL. A negative result does not preclude SARS-Cov-2 infection and should not be used as the sole basis for treatment or other patient management decisions. A  negative result may occur with  improper specimen collection/handling, submission of specimen other than nasopharyngeal swab, presence of viral mutation(s) within the areas targeted by this assay, and inadequate number of viral copies (<131 copies/mL). A negative result must be combined with clinical observations, patient history, and epidemiological information. The expected result is Negative. Fact Sheet for Patients:  PinkCheek.be Fact Sheet for Healthcare Providers:  GravelBags.it This test is not yet ap proved or cleared by the [place] FDA and  has been authorized for detection and/or diagnosis of SARS-CoV-2 by FDA under an Emergency Use Authorization (EUA). This EUA will remain  in effect (meaning this test can be used) for the duration of the COVID-19 declaration under Section 564(b)(1) of the Act, 21 U.S.C. section 360bbb-3(b)(1), unless the authorization is terminated or revoked sooner.    Influenza A by PCR NEGATIVE NEGATIVE Final   Influenza B by PCR NEGATIVE NEGATIVE Final    Comment: (NOTE) The Xpert Xpress SARS-CoV-2/FLU/RSV assay is intended as an aid in  the diagnosis of influenza from Nasopharyngeal swab specimens and  should not be used as a sole basis for treatment. Nasal washings and  aspirates are unacceptable for Xpert Xpress SARS-CoV-2/FLU/RSV  testing. Fact Sheet for Patients: PinkCheek.be Fact Sheet for Healthcare Providers: GravelBags.it This test is not yet approved or cleared by the [place] FDA and  has been authorized for detection and/or diagnosis of SARS-CoV-2 by  FDA under an Emergency Use Authorization (EUA). This EUA will remain  in effect (meaning this test can be used) for the duration of the  Covid-19 declaration under Section 564(b)(1) of the Act, 21  U.S.C. section 360bbb-3(b)(1), unless the authorization is    terminated or revoked. Performed at [facility], [address] [place], [place] [address]   MRSA PCR Screening     Status: None   Collection Time: 03/13/19  5:18 PM   Specimen: Nasal Mucosa; Nasopharyngeal  Result Value Ref Range Status   MRSA by PCR NEGATIVE NEGATIVE Final    Comment:        The GeneXpert MRSA Assay (FDA approved for NASAL specimens only), is one component of a comprehensive MRSA colonization surveillance program. It is not intended to diagnose MRSA infection nor to guide or monitor treatment for MRSA infections. Performed at [facility], [address] [place], [place] [address]   Surgical pcr screen     Status: None   Collection Time: 03/15/19  9:28 PM   Specimen: Nasal Mucosa; Nasal Swab  Result Value Ref Range Status   MRSA, PCR NEGATIVE NEGATIVE Final   Staphylococcus aureus NEGATIVE NEGATIVE Final    Comment: (NOTE) The Xpert SA Assay (FDA approved for NASAL specimens in patients 53 years of age and older), is one component of a comprehensive surveillance program. It is not intended to diagnose infection nor to guide or monitor treatment. Performed at [facility], [place] [address] [place],  [address]  Culture, respiratory (non-expectorated)     Status: None   Collection Time: 03/19/19  3:00 PM   Specimen: Tracheal Aspirate; Respiratory  Result Value Ref Range Status   Specimen Description TRACHEAL ASPIRATE  Final   Special Requests Normal  Final   Gram Stain   Final    ABUNDANT WBC PRESENT,BOTH PMN AND MONONUCLEAR FEW GRAM POSITIVE COCCI RARE GRAM NEGATIVE RODS    Culture   Final    FEW Consistent with normal respiratory flora. Performed at [facility], [place] [address], [place], [place] [address]    Report Status 03/21/2019 FINAL  Final    Objective:  Vital signs for last 24 hours: Temp:  [97.7 F (36.5 C)-99.4 F (37.4 C)] 97.7 F (36.5 C) (02/07 1100) Pulse Rate:  [57-132] 125 (02/07 0828) Resp:  [15-42] 35  (02/07 0828) BP: (91-133)/(56-92) 109/68 (02/07 0700) SpO2:  [84 %-100 %] 99 % (02/07 0828) Arterial Line BP: (61-149)/(37-79) 122/49 (02/07 0700) Weight:  [95.1 kg] 95.1 kg (02/07 0342)  Hemodynamic parameters for last 24 hours: CVP:  [9 mmHg-28 mmHg] 10 mmHg  Intake/Output from previous day: 02/06 0701 - 02/07 0700 In: 2281.4 [P.O.:220; I.V.:711.6; NG/GT:1250; IV Piggyback:99.9] Out: 2360 [Urine:2060; Stool:300]  Intake/Output this shift: No intake/output data recorded.   Physical Exam:  General: Awake extubated sitting in chair.  Appears weak Neuro: alert, responds to commands.  Speech is clearer than yesterday.  Continues to have generalized weakness but improved from yesterday. HEENT/Neck: Better neck control. Resp: Breathing comfortably.  No tachypnea.  Chest is now clear to auscultation. CVS:  irregularly irregular rhythm, JVP not visualized.  Apex beat is diffuse and sustained.  S1-S2 unremarkable.  No gallop. GI: soft, non-tender, mildly distended Skin: no rash, warm & dry Extremities: improving edema in UEs and LEs  Assessment/Plan:  Was critically ill due to hypercarbic Hypoxic Respiratory Failure secondary to cardiogenic pulmonary edema.  Now tolerating extubation with improving oxygen requirements. -Progressive mobilization -Encourage incentive spirometry   Remains critically ill due to cardiogenic shock requiring titration of inotropes and vasopressors secondary to HFrEF. Acceptable SCV O2.  Suspect milrinone, norepinephrine and amiodarone may be now working at cross purposes -We will stop milrinone and recheck oximetry -Wean norepinephrine to off titrating to keep systolic blood pressure greater than 120. -Remains total body water overloaded but creatinine is rising.  Suspect he is intravascular volume contracted as CVP now trending 6 or lower.  Will hold further diuresis today. - Ideally would like to start introducing hydralazine/nitrates as part of guideline  directed heart failure therapy (would hold ACEi given worsening renal function).   Critically ill due to atrial fibrillation with rapid ventricular response -Increased metoprolol to 25 3 times daily -Transition amiodarone to oral -Heart rate should also improve off milrinone.  AKI has worsened despite aggressive diuresis.  Suspect intravascular volume contraction -Diuretic holiday today  Daily Goals Checklist  Pain/Anxiety/Delirium protocol (if indicated): Oxycodone as needed Respiratory support goals: Wean O2 as tolerated.  Encourage incentive spirometry.  Progressive mobilization. Blood pressure target: Continue norepinephrine for renal perfusion at this time DVT prophylaxis: Lovenox 30 mg daily Nutrition Status: Nutrition Problem: Increased nutrient needs Etiology: post-op healing Signs/Symptoms: estimated needs Interventions: Tube feeding, Prostat do not believe he will be able to maintain sufficient oral intake. GI prophylaxis: No longer indicated Fluid status goals: Diuresis at this time Urinary catheter: Required for retention and edema. Central lines: Continue current central lines until off vasoactive agents Glucose control: We will increase tube feed coverage.  Mobility/therapy needs: Progressive ambulation PT consult, SLP consult Antibiotic de-escalation: Complete 7 days antibiotic for suspected pneumonia Home medication reconciliation: Resuming home metoprolol, continuing home Protonix.  Restart home Flomax once able to swallow. Daily labs: BMP daily Code Status: Full code Family Communication: Updated by cardiac surgery Disposition: ICU until infusions discontinued.  CRITICAL CARE Performed by: Kipp Brood   Total critical care time: 40 minutes  Critical care time was exclusive of separately billable procedures and treating other patients.  Critical care was necessary to treat or prevent imminent or life-threatening deterioration.  Critical care was time spent  personally by me on the following activities: development of treatment plan with patient and/or surrogate as well as nursing, discussions with consultants, evaluation of patient's response to treatment, examination of patient, obtaining history from patient or surrogate, ordering and performing treatments and interventions, ordering and review of laboratory studies, ordering and review of radiographic studies, pulse oximetry, re-evaluation of patient's condition and participation in multidisciplinary rounds.  Kipp Brood, MD [facility] ICU Physician [place]  Pager: [phone] Mobile: [phone] After hours: [phone].

## 2019-03-25 NOTE — Progress Notes (Addendum)
9 Days Post-Op Procedure(s) (LRB): CORONARY ARTERY BYPASS GRAFTING (CABG), ON PUMP, TIMES THREE, USING LEFT INTERNAL MAMMARY ARTERY, RIGHT GREATER SAPHENOUS VEIN HARVESTED ENDOSCOPICALLY (N/A) MAZE (N/A) CLIPPING OF LEFT  ATRIAL APPENDAGE - USING ATRICLIP SIZE 40 (N/A) TRANSESOPHAGEAL ECHOCARDIOGRAM (TEE) (N/A) Subjective: Lungs wet, Bun rising Wet cough, wheeze >> metaneb ordered passed swallow test. OOB to chair  Objective: Vital signs in last 24 hours: Temp:  [97.8 F (36.6 C)-99.4 F (37.4 C)] 97.9 F (36.6 C) (02/07 0819) Pulse Rate:  [57-132] 125 (02/07 0828) Cardiac Rhythm: Atrial fibrillation (02/07 0400) Resp:  [15-42] 35 (02/07 0828) BP: (91-133)/(44-92) 109/68 (02/07 0700) SpO2:  [84 %-100 %] 99 % (02/07 0828) Arterial Line BP: (61-149)/(36-79) 122/49 (02/07 0700) Weight:  [95.1 kg] 95.1 kg (02/07 0342)  Hemodynamic parameters for last 24 hours: CVP:  [9 mmHg-28 mmHg] 10 mmHg  Intake/Output from previous day: 02/06 0701 - 02/07 0700 In: 2281.4 [P.O.:220; I.V.:711.6; NG/GT:1250; IV Piggyback:99.9] Out: 2360 [Urine:2060; Stool:300] Intake/Output this shift: No intake/output data recorded.  EXAM coarse breath sounds 2+ edema afib Neuro intact Lab Results: Recent Labs    03/24/19 0500 03/24/19 1826 03/25/19 0328 03/25/19 0335  WBC 14.8*  --   --  13.2*  HGB 8.6*   < > 8.5* 8.5*  HCT 27.4*   < > 25.0* 28.0*  PLT 348  --   --  315   < > = values in this interval not displayed.   BMET:  Recent Labs    03/24/19 0500 03/24/19 1826 03/25/19 0328 03/25/19 0335  NA 139   < > 139 136  K 4.1   < > 3.8 3.7  CL 99  --   --  98  CO2 25  --   --  27  GLUCOSE 239*  --   --  228*  BUN 93*  --   --  110*  CREATININE 2.45*  --   --  2.63*  CALCIUM 8.2*  --   --  7.9*   < > = values in this interval not displayed.    PT/INR: No results for input(s): LABPROT, INR in the last 72 hours. ABG    Component Value Date/Time   PHART 7.441 03/25/2019 0328   HCO3  26.4 03/25/2019 0328   TCO2 28 03/25/2019 0328   ACIDBASEDEF 1.0 03/20/2019 1745   O2SAT 60.0 03/25/2019 0335   CBG (last 3)  Recent Labs    03/24/19 2339 03/25/19 0325 03/25/19 0812  GLUCAP 165* 169* 186*    Assessment/Plan: S/P Procedure(s) (LRB): CORONARY ARTERY BYPASS GRAFTING (CABG), ON PUMP, TIMES THREE, USING LEFT INTERNAL MAMMARY ARTERY, RIGHT GREATER SAPHENOUS VEIN HARVESTED ENDOSCOPICALLY (N/A) MAZE (N/A) CLIPPING OF LEFT  ATRIAL APPENDAGE - USING ATRICLIP SIZE 40 (N/A) TRANSESOPHAGEAL ECHOCARDIOGRAM (TEE) (N/A) Leave on low dose norepi until renal fx improves Pul toilette Mobilize cont Tube feed for now Foley to monitor urine output Add lantus coverage  LOS: 11 days    Tharon Aquas Trigt III 03/25/2019

## 2019-03-25 NOTE — Plan of Care (Signed)
Pt is alert and oriented, sometimes forgetful regarding time but answers all questions appropriately and promptly. Pt has generalized weakness and moves all extremities. Pt's oxygen has increased from 5L to 8L HFNC due to shortness of breath and tachypnea. Pt complained of mild chest pain earlier in the shift, was restless, and uncomfortable so PRN medication was given (see MAR). Thereafter, pt rested well, did not complain of pain, remained oriented, and was alert to voice. Pt is not tachypnic while resting with eyes closed. Pt has had adequate urine output but it has decreased as the night went on. Pt's lungs are diminished throughout with expiratory wheezing. Pt remains AFIB with heart rate from 110s to 120s, levo gtt has been weaned off within the last hour and is maintaining with map in the mid 61s. On 1 mic of levo, pt's MAP maintained in the 67s. Pt's arterial line has been zeroed many times and is very positional. Pt does not voice any complaints or concerns at this time. Call bell is within reach and bed is in lowest position.  Problem: Clinical Measurements: Goal: Cardiovascular complication will be avoided Outcome: Progressing   Problem: Elimination: Goal: Will not experience complications related to bowel motility Outcome: Progressing   Problem: Pain Managment: Goal: General experience of comfort will improve Outcome: Progressing   Problem: Safety: Goal: Ability to remain free from injury will improve Outcome: Progressing   Problem: Skin Integrity: Goal: Risk for impaired skin integrity will decrease Outcome: Progressing

## 2019-03-25 NOTE — Progress Notes (Signed)
   Afib rates still not well controlled - increase metoprolol to 25 mg per tube TID. Monitor BP.  Pixie Casino, MD, [facility], [place] Director of the Advanced Lipid Disorders &  Cardiovascular Risk Reduction Clinic Diplomate of the American Board of Clinical Lipidology Attending Cardiologist  Direct Dial: [phone]  Fax: [phone]  Website:  [email or web address]

## 2019-03-26 ENCOUNTER — Inpatient Hospital Stay (HOSPITAL_COMMUNITY): Payer: Medicare Other

## 2019-03-26 DIAGNOSIS — Z9861 Coronary angioplasty status: Secondary | ICD-10-CM

## 2019-03-26 LAB — COMPREHENSIVE METABOLIC PANEL
ALT: 106 U/L — ABNORMAL HIGH (ref 0–44)
AST: 137 U/L — ABNORMAL HIGH (ref 15–41)
Albumin: 2.2 g/dL — ABNORMAL LOW (ref 3.5–5.0)
Alkaline Phosphatase: 63 U/L (ref 38–126)
Anion gap: 14 (ref 5–15)
BUN: 135 mg/dL — ABNORMAL HIGH (ref 8–23)
CO2: 25 mmol/L (ref 22–32)
Calcium: 7.3 mg/dL — ABNORMAL LOW (ref 8.9–10.3)
Chloride: 96 mmol/L — ABNORMAL LOW (ref 98–111)
Creatinine, Ser: 3.2 mg/dL — ABNORMAL HIGH (ref 0.61–1.24)
GFR calc Af Amer: 19 mL/min — ABNORMAL LOW (ref >60)
GFR calc non Af Amer: 16 mL/min — ABNORMAL LOW (ref >60)
Glucose, Bld: 287 mg/dL — ABNORMAL HIGH (ref 70–99)
Potassium: 3.9 mmol/L (ref 3.5–5.1)
Sodium: 135 mmol/L (ref 135–145)
Total Bilirubin: 0.5 mg/dL (ref 0.3–1.2)
Total Protein: 5.6 g/dL — ABNORMAL LOW (ref 6.5–8.1)

## 2019-03-26 LAB — CBC
HCT: 26.4 % — ABNORMAL LOW (ref 39.0–52.0)
Hemoglobin: 8.1 g/dL — ABNORMAL LOW (ref 13.0–17.0)
MCH: 28.5 pg (ref 26.0–34.0)
MCHC: 30.7 g/dL (ref 30.0–36.0)
MCV: 93 fL (ref 80.0–100.0)
Platelets: 343 10*3/uL (ref 150–400)
RBC: 2.84 MIL/uL — ABNORMAL LOW (ref 4.22–5.81)
RDW: 15.9 % — ABNORMAL HIGH (ref 11.5–15.5)
WBC: 14.6 10*3/uL — ABNORMAL HIGH (ref 4.0–10.5)
nRBC: 0.3 % — ABNORMAL HIGH (ref 0.0–0.2)

## 2019-03-26 LAB — POCT I-STAT 7, (LYTES, BLD GAS, ICA,H+H)
Acid-Base Excess: 5 mmol/L — ABNORMAL HIGH (ref 0.0–2.0)
Bicarbonate: 31.7 mmol/L — ABNORMAL HIGH (ref 20.0–28.0)
Calcium, Ion: 1.05 mmol/L — ABNORMAL LOW (ref 1.15–1.40)
HCT: 26 % — ABNORMAL LOW (ref 39.0–52.0)
Hemoglobin: 8.8 g/dL — ABNORMAL LOW (ref 13.0–17.0)
O2 Saturation: 99 %
Patient temperature: 97.9
Potassium: 4.6 mmol/L (ref 3.5–5.1)
Sodium: 134 mmol/L — ABNORMAL LOW (ref 135–145)
TCO2: 33 mmol/L — ABNORMAL HIGH (ref 22–32)
pCO2 arterial: 58.8 mmHg — ABNORMAL HIGH (ref 32.0–48.0)
pH, Arterial: 7.338 — ABNORMAL LOW (ref 7.350–7.450)
pO2, Arterial: 143 mmHg — ABNORMAL HIGH (ref 83.0–108.0)

## 2019-03-26 LAB — POCT I-STAT, CHEM 8
BUN: 124 mg/dL — ABNORMAL HIGH (ref 8–23)
Calcium, Ion: 1.1 mmol/L — ABNORMAL LOW (ref 1.15–1.40)
Chloride: 95 mmol/L — ABNORMAL LOW (ref 98–111)
Creatinine, Ser: 2.8 mg/dL — ABNORMAL HIGH (ref 0.61–1.24)
Glucose, Bld: 159 mg/dL — ABNORMAL HIGH (ref 70–99)
HCT: 30 % — ABNORMAL LOW (ref 39.0–52.0)
Hemoglobin: 10.2 g/dL — ABNORMAL LOW (ref 13.0–17.0)
Potassium: 4.2 mmol/L (ref 3.5–5.1)
Sodium: 137 mmol/L (ref 135–145)
TCO2: 27 mmol/L (ref 22–32)

## 2019-03-26 LAB — GLUCOSE, CAPILLARY
Glucose-Capillary: 121 mg/dL — ABNORMAL HIGH (ref 70–99)
Glucose-Capillary: 128 mg/dL — ABNORMAL HIGH (ref 70–99)
Glucose-Capillary: 157 mg/dL — ABNORMAL HIGH (ref 70–99)
Glucose-Capillary: 162 mg/dL — ABNORMAL HIGH (ref 70–99)
Glucose-Capillary: 181 mg/dL — ABNORMAL HIGH (ref 70–99)
Glucose-Capillary: 187 mg/dL — ABNORMAL HIGH (ref 70–99)
Glucose-Capillary: 273 mg/dL — ABNORMAL HIGH (ref 70–99)

## 2019-03-26 LAB — COOXEMETRY PANEL
Carboxyhemoglobin: 1.3 % (ref 0.5–1.5)
Methemoglobin: 1.1 % (ref 0.0–1.5)
O2 Saturation: 87.9 %
Total hemoglobin: 8.7 g/dL — ABNORMAL LOW (ref 12.0–16.0)

## 2019-03-26 LAB — RENAL FUNCTION PANEL
Albumin: 2.4 g/dL — ABNORMAL LOW (ref 3.5–5.0)
Anion gap: 20 — ABNORMAL HIGH (ref 5–15)
BUN: 149 mg/dL — ABNORMAL HIGH (ref 8–23)
CO2: 25 mmol/L (ref 22–32)
Calcium: 7.8 mg/dL — ABNORMAL LOW (ref 8.9–10.3)
Chloride: 91 mmol/L — ABNORMAL LOW (ref 98–111)
Creatinine, Ser: 3.41 mg/dL — ABNORMAL HIGH (ref 0.61–1.24)
GFR calc Af Amer: 18 mL/min — ABNORMAL LOW (ref >60)
GFR calc non Af Amer: 15 mL/min — ABNORMAL LOW (ref >60)
Glucose, Bld: 196 mg/dL — ABNORMAL HIGH (ref 70–99)
Phosphorus: 6.3 mg/dL — ABNORMAL HIGH (ref 2.5–4.6)
Potassium: 4.1 mmol/L (ref 3.5–5.1)
Sodium: 136 mmol/L (ref 135–145)

## 2019-03-26 LAB — PHOSPHORUS: Phosphorus: 6.8 mg/dL — ABNORMAL HIGH (ref 2.5–4.6)

## 2019-03-26 LAB — BRAIN NATRIURETIC PEPTIDE: B Natriuretic Peptide: 687.5 pg/mL — ABNORMAL HIGH (ref 0.0–100.0)

## 2019-03-26 LAB — LACTIC ACID, PLASMA
Lactic Acid, Venous: 0.9 mmol/L (ref 0.5–1.9)
Lactic Acid, Venous: 1 mmol/L (ref 0.5–1.9)

## 2019-03-26 LAB — MAGNESIUM: Magnesium: 2.2 mg/dL (ref 1.7–2.4)

## 2019-03-26 MED ORDER — ATORVASTATIN CALCIUM 80 MG PO TABS
80.0000 mg | ORAL_TABLET | Freq: Every day | ORAL | Status: DC
  Administered 2019-03-26 – 2019-04-13 (×17): 80 mg
  Filled 2019-03-26 (×17): qty 1

## 2019-03-26 MED ORDER — ALBUMIN HUMAN 5 % IV SOLN
12.5000 g | Freq: Once | INTRAVENOUS | Status: AC
  Administered 2019-03-26: 12.5 g via INTRAVENOUS
  Filled 2019-03-26: qty 250

## 2019-03-26 MED ORDER — DEXMEDETOMIDINE HCL IN NACL 400 MCG/100ML IV SOLN
0.0000 ug/kg/h | INTRAVENOUS | Status: AC
  Administered 2019-03-26 – 2019-03-27 (×5): 0.4 ug/kg/h via INTRAVENOUS
  Administered 2019-03-28: 0.3 ug/kg/h via INTRAVENOUS
  Administered 2019-03-29: 01:00:00 0.5 ug/kg/h via INTRAVENOUS
  Filled 2019-03-26 (×7): qty 100

## 2019-03-26 MED ORDER — LEVALBUTEROL HCL 0.63 MG/3ML IN NEBU
0.6300 mg | INHALATION_SOLUTION | Freq: Four times a day (QID) | RESPIRATORY_TRACT | Status: AC
  Administered 2019-03-26 – 2019-03-28 (×8): 0.63 mg via RESPIRATORY_TRACT
  Filled 2019-03-26 (×7): qty 3

## 2019-03-26 MED ORDER — NOREPINEPHRINE 4 MG/250ML-% IV SOLN
INTRAVENOUS | Status: AC
  Administered 2019-03-26: 2 ug/min via INTRAVENOUS
  Filled 2019-03-26: qty 250

## 2019-03-26 MED ORDER — CHLORHEXIDINE GLUCONATE 0.12% ORAL RINSE (MEDLINE KIT)
15.0000 mL | Freq: Two times a day (BID) | OROMUCOSAL | Status: DC
  Administered 2019-03-26 – 2019-04-28 (×61): 15 mL via OROMUCOSAL

## 2019-03-26 MED ORDER — FENTANYL CITRATE (PF) 100 MCG/2ML IJ SOLN
25.0000 ug | INTRAMUSCULAR | Status: DC | PRN
  Administered 2019-03-28 – 2019-03-30 (×3): 50 ug via INTRAVENOUS
  Administered 2019-03-30 (×3): 100 ug via INTRAVENOUS
  Administered 2019-03-30 – 2019-03-31 (×4): 50 ug via INTRAVENOUS
  Administered 2019-03-31 (×2): 100 ug via INTRAVENOUS
  Filled 2019-03-26 (×11): qty 2

## 2019-03-26 MED ORDER — EPINEPHRINE PF 1 MG/ML IJ SOLN
0.5000 ug/min | INTRAVENOUS | Status: DC
  Filled 2019-03-26: qty 4

## 2019-03-26 MED ORDER — BETHANECHOL CHLORIDE 5 MG PO TABS
5.0000 mg | ORAL_TABLET | Freq: Three times a day (TID) | ORAL | Status: DC
  Administered 2019-03-26 – 2019-03-29 (×9): 5 mg
  Filled 2019-03-26 (×13): qty 1

## 2019-03-26 MED ORDER — METOPROLOL TARTRATE 12.5 MG HALF TABLET
12.5000 mg | ORAL_TABLET | Freq: Two times a day (BID) | ORAL | Status: DC
  Administered 2019-03-26 – 2019-03-27 (×2): 12.5 mg
  Filled 2019-03-26 (×2): qty 1

## 2019-03-26 MED ORDER — DEXTROSE-NACL 5-0.45 % IV SOLN
INTRAVENOUS | Status: DC
  Administered 2019-03-27: 50 mL/h via INTRAVENOUS

## 2019-03-26 MED ORDER — DOCUSATE SODIUM 50 MG/5ML PO LIQD
200.0000 mg | Freq: Every day | ORAL | Status: DC
  Administered 2019-03-27 – 2019-04-05 (×5): 200 mg
  Filled 2019-03-26 (×6): qty 20

## 2019-03-26 MED ORDER — BETHANECHOL CHLORIDE 5 MG PO TABS
5.0000 mg | ORAL_TABLET | Freq: Three times a day (TID) | ORAL | Status: DC
  Filled 2019-03-26: qty 1

## 2019-03-26 MED ORDER — ASPIRIN 81 MG PO CHEW
81.0000 mg | CHEWABLE_TABLET | Freq: Every day | ORAL | Status: DC
  Administered 2019-03-26 – 2019-04-13 (×18): 81 mg
  Filled 2019-03-26 (×19): qty 1

## 2019-03-26 MED ORDER — KETAMINE HCL 10 MG/ML IJ SOLN
0.5000 mg/kg | Freq: Once | INTRAMUSCULAR | Status: AC
  Administered 2019-03-26: 34 mg via INTRAVENOUS
  Filled 2019-03-26: qty 3.4

## 2019-03-26 MED ORDER — ORAL CARE MOUTH RINSE
15.0000 mL | OROMUCOSAL | Status: DC
  Administered 2019-03-26 – 2019-04-29 (×314): 15 mL via OROMUCOSAL

## 2019-03-26 MED ORDER — MIDAZOLAM HCL 2 MG/2ML IJ SOLN
1.0000 mg | Freq: Four times a day (QID) | INTRAMUSCULAR | Status: DC | PRN
  Administered 2019-03-30 – 2019-03-31 (×3): 1 mg via INTRAVENOUS
  Filled 2019-03-26 (×3): qty 2

## 2019-03-26 MED ORDER — PROPOFOL 10 MG/ML IV BOLUS
INTRAVENOUS | Status: AC
  Administered 2019-03-26: 06:00:00 5 mg
  Filled 2019-03-26: qty 20

## 2019-03-26 MED ORDER — INSULIN GLARGINE 100 UNIT/ML ~~LOC~~ SOLN
18.0000 [IU] | Freq: Two times a day (BID) | SUBCUTANEOUS | Status: DC
  Administered 2019-03-26 – 2019-03-27 (×3): 18 [IU] via SUBCUTANEOUS
  Filled 2019-03-26 (×4): qty 0.18

## 2019-03-26 MED ORDER — AMIODARONE HCL IN DEXTROSE 360-4.14 MG/200ML-% IV SOLN
30.0000 mg/h | INTRAVENOUS | Status: DC
  Administered 2019-03-26: 30 mg/h via INTRAVENOUS
  Filled 2019-03-26: qty 200

## 2019-03-26 MED ORDER — INSULIN GLARGINE 100 UNIT/ML ~~LOC~~ SOLN
18.0000 [IU] | Freq: Two times a day (BID) | SUBCUTANEOUS | Status: DC
  Filled 2019-03-26: qty 0.18

## 2019-03-26 MED ORDER — SODIUM CHLORIDE 0.9 % IV SOLN
2.0000 g | Freq: Every day | INTRAVENOUS | Status: DC
  Administered 2019-03-26 – 2019-03-28 (×2): 2 g via INTRAVENOUS
  Filled 2019-03-26 (×3): qty 2

## 2019-03-26 MED ORDER — DOCUSATE SODIUM 50 MG/5ML PO LIQD: 100.0000 mg | Freq: Two times a day (BID) | ORAL | Status: DC | PRN

## 2019-03-26 MED ORDER — FENTANYL CITRATE (PF) 100 MCG/2ML IJ SOLN: 25.0000 ug | INTRAMUSCULAR | Status: DC | PRN

## 2019-03-26 MED ORDER — PANTOPRAZOLE SODIUM 40 MG IV SOLR
40.0000 mg | INTRAVENOUS | Status: DC
  Administered 2019-03-26 – 2019-03-29 (×4): 40 mg via INTRAVENOUS
  Filled 2019-03-26 (×4): qty 40

## 2019-03-26 MED ORDER — AMIODARONE HCL IN DEXTROSE 360-4.14 MG/200ML-% IV SOLN
30.0000 mg/h | INTRAVENOUS | Status: DC
  Administered 2019-03-26 – 2019-03-27 (×4): 30 mg/h via INTRAVENOUS
  Administered 2019-03-28 (×2): 60 mg/h via INTRAVENOUS
  Administered 2019-03-28: 07:00:00 30 mg/h via INTRAVENOUS
  Administered 2019-03-29 – 2019-04-06 (×39): 60 mg/h via INTRAVENOUS
  Administered 2019-04-07 (×2): 30 mg/h via INTRAVENOUS
  Administered 2019-04-07: 60 mg/h via INTRAVENOUS
  Administered 2019-04-08 – 2019-04-13 (×12): 30 mg/h via INTRAVENOUS
  Filled 2019-03-26 (×46): qty 200
  Filled 2019-03-26: qty 400
  Filled 2019-03-26 (×14): qty 200

## 2019-03-26 MED ORDER — EPINEPHRINE PF 1 MG/ML IJ SOLN
2.0000 ug/min | INTRAVENOUS | Status: DC
  Administered 2019-03-27 – 2019-03-30 (×3): 2 ug/min via INTRAVENOUS
  Administered 2019-03-30: 3 ug/min via INTRAVENOUS
  Filled 2019-03-26 (×6): qty 4

## 2019-03-26 MED ORDER — ALBUMIN HUMAN 25 % IV SOLN
12.5000 g | Freq: Four times a day (QID) | INTRAVENOUS | Status: AC
  Administered 2019-03-26 – 2019-03-27 (×2): 12.5 g via INTRAVENOUS
  Filled 2019-03-26 (×2): qty 50

## 2019-03-26 MED ORDER — FUROSEMIDE 10 MG/ML IJ SOLN: 80.0000 mg | Freq: Once | INTRAMUSCULAR | Status: DC

## 2019-03-26 MED ORDER — NOREPINEPHRINE 4 MG/250ML-% IV SOLN
0.0000 ug/min | INTRAVENOUS | Status: DC
  Administered 2019-03-27: 2 ug/min via INTRAVENOUS
  Administered 2019-03-28: 6 ug/min via INTRAVENOUS
  Administered 2019-03-30: 4 ug/min via INTRAVENOUS
  Administered 2019-03-31: 11:00:00 12 ug/min via INTRAVENOUS
  Administered 2019-03-31: 20 ug/min via INTRAVENOUS
  Administered 2019-03-31: 24 ug/min via INTRAVENOUS
  Filled 2019-03-26 (×2): qty 250
  Filled 2019-03-26: qty 500
  Filled 2019-03-26 (×3): qty 250

## 2019-03-26 MED ORDER — ASPIRIN EC 81 MG PO TBEC: 81.0000 mg | DELAYED_RELEASE_TABLET | Freq: Every day | ORAL | Status: DC

## 2019-03-26 NOTE — Progress Notes (Addendum)
TCTS DAILY ICU PROGRESS NOTE                   [place]            [place] [address]          [phone]   10 Days Post-Op Procedure(s) (LRB): CORONARY ARTERY BYPASS GRAFTING (CABG), ON PUMP, TIMES THREE, USING LEFT INTERNAL MAMMARY ARTERY, RIGHT GREATER SAPHENOUS VEIN HARVESTED ENDOSCOPICALLY (N/A) MAZE (N/A) CLIPPING OF LEFT  ATRIAL APPENDAGE - USING ATRICLIP SIZE 40 (N/A) TRANSESOPHAGEAL ECHOCARDIOGRAM (TEE) (N/A)  Total Length of Stay:  LOS: 12 days   Subjective: Re- intubated  Objective: Vital signs in last 24 hours: Temp:  [97.6 F (36.4 C)-97.9 F (36.6 C)] 97.9 F (36.6 C) (02/08 0338) Pulse Rate:  [78-161] 125 (02/08 0700) Cardiac Rhythm: Atrial fibrillation (02/08 0400) Resp:  [14-39] 14 (02/08 0715) BP: (85-132)/(40-105) 120/63 (02/08 0715) SpO2:  [75 %-100 %] 99 % (02/08 0700) Arterial Line BP: (47-251)/(41-97) 125/62 (02/08 0715) FiO2 (%):  [60 %-100 %] 100 % (02/08 0601)  Filed Weights   03/23/19 0500 03/24/19 0500 03/25/19 0342  Weight: 94.6 kg 94.4 kg 95.1 kg    Weight change:    Hemodynamic parameters for last 24 hours:    Intake/Output from previous day: 02/07 0701 - 02/08 0700 In: 1255.3 [P.O.:60; I.V.:145.3; NG/GT:1050] Out: 1190 [Urine:790; Emesis/NG output:200; Stool:200]  Intake/Output this shift: No intake/output data recorded.  Current Meds: Scheduled Meds: . amiodarone  200 mg Oral BID  . aspirin EC  325 mg Oral Daily   Or  . aspirin  324 mg Per Tube Daily  . atorvastatin  80 mg Oral q1800  . bisacodyl  10 mg Oral Daily   Or  . bisacodyl  10 mg Rectal Daily  . chlorhexidine gluconate (MEDLINE KIT)  15 mL Mouth Rinse BID  . Chlorhexidine Gluconate Cloth  6 each Topical Daily  . docusate  200 mg Oral Daily  . feeding supplement (PRO-STAT SUGAR FREE 64)  30 mL Per Tube TID  . heparin  5,000 Units Subcutaneous Q12H  . insulin aspart  0-24 Units Subcutaneous Q4H  . insulin glargine  14 Units Subcutaneous  BID  . lidocaine  5 mL Intradermal Once  . mouth rinse  15 mL Mouth Rinse 10 times per day  . metoprolol tartrate  25 mg Per Tube TID  . pantoprazole sodium  40 mg Per Tube Daily  . psyllium  1 packet Per Tube BID  . sodium chloride flush  10-40 mL Intracatheter Q12H  . tamsulosin  0.4 mg Oral Daily   Continuous Infusions: . sodium chloride    . sodium chloride    . sodium chloride 10 mL/hr at 03/25/19 0733  . epinephrine 6 mcg/min (03/26/19 4696)  . feeding supplement (VITAL AF 1.2 CAL) 1,000 mL (03/26/19 0212)   PRN Meds:.sodium chloride, dextrose, fentaNYL (SUBLIMAZE) injection, fentaNYL (SUBLIMAZE) injection, ondansetron (ZOFRAN) IV, oxyCODONE, sodium chloride flush, sodium chloride flush  General appearance: alert, cooperative and no distress Neurologic: intact Heart: irregularly irregular rhythm and tachy Lungs: coarse BS Abdomen: soft, nontender Extremities: + edema Wound: chest dressing with some drainage  Lab Results: CBC: Recent Labs    03/25/19 0335 03/25/19 1617 03/26/19 0626 03/26/19 0649  WBC 13.2*  --  14.6*  --   HGB 8.5*   < > 8.1* 8.8*  HCT 28.0*   < > 26.4* 26.0*  PLT 315  --  343  --    < > =  values in this interval not displayed.   BMET:  Recent Labs    03/25/19 0335 03/25/19 0335 03/25/19 1617 03/25/19 1617 03/26/19 0626 03/26/19 0649  NA 136   < > 137   < > 135 134*  K 3.7   < > 4.2   < > 3.9 4.6  CL 98   < > 95*  --  96*  --   CO2 27  --   --   --  25  --   GLUCOSE 228*   < > 159*  --  287*  --   BUN 110*   < > 124*  --  135*  --   CREATININE 2.63*   < > 2.80*  --  3.20*  --   CALCIUM 7.9*  --   --   --  7.3*  --    < > = values in this interval not displayed.    CMET: Lab Results  Component Value Date   WBC 14.6 (H) 03/26/2019   HGB 8.8 (L) 03/26/2019   HCT 26.0 (L) 03/26/2019   PLT 343 03/26/2019   GLUCOSE 287 (H) 03/26/2019   CHOL 117 03/16/2019   TRIG 52 03/16/2019   HDL 33 (L) 03/16/2019   LDLCALC 74 03/16/2019   ALT  106 (H) 03/26/2019   AST 137 (H) 03/26/2019   NA 134 (L) 03/26/2019   K 4.6 03/26/2019   CL 96 (L) 03/26/2019   CREATININE 3.20 (H) 03/26/2019   BUN 135 (H) 03/26/2019   CO2 25 03/26/2019   TSH 3.683 03/13/2019   INR 1.6 (H) 03/16/2019   HGBA1C 6.1 (H) 03/13/2019      PT/INR: No results for input(s): LABPROT, INR in the last 72 hours. Radiology: [facility] Chest Port 1 View  Result Date: 03/26/2019 CLINICAL DATA:  86 year old male postoperative day 10 status post CABG, left atrium MAZE procedure. EXAM: PORTABLE CHEST 1 VIEW COMPARISON:  Portable chest 03/25/2019 and earlier. FINDINGS: Portable AP supine view at 0559 hours. Intubated. Endotracheal tube tip in good position just below the clavicles. Stable visible enteric tube, tip not included. Stable right PICC line. Stable cardiomegaly and mediastinal contours. Stable pulmonary vascularity, no overt edema suspected. Veiling left greater than right lung base and dense retrocardiac opacity appears stable. No pneumothorax. There is a minimally displaced anterior left 2nd rib fracture which is stable in retrospect. IMPRESSION: 1. Intubated with ET tube tip in good position. Other lines and tubes appear stable. 2. Stable ventilation with evidence of bilateral pleural effusions and lower lobe collapse or consolidation. 3. Minimally displaced anterior left 2nd rib fracture, stable across this recent series of exams. Electronically Signed   By: Genevie Ann M.D.   On: 03/26/2019 06:31     Assessment/Plan: S/P Procedure(s) (LRB): CORONARY ARTERY BYPASS GRAFTING (CABG), ON PUMP, TIMES THREE, USING LEFT INTERNAL MAMMARY ARTERY, RIGHT GREATER SAPHENOUS VEIN HARVESTED ENDOSCOPICALLY (N/A) MAZE (N/A) CLIPPING OF LEFT  ATRIAL APPENDAGE - USING ATRICLIP SIZE 40 (N/A) TRANSESOPHAGEAL ECHOCARDIOGRAM (TEE) (N/A)  1 Back on vent for failed NIPPV/hypoxia- appreciate PCCM care management, ABG shows mild hypercarbic acidosis 2 on epi gtt for hypotension, hopefully can  wean quickly as remains tachy with afib 3 renal fxn is worse with creat 3.2, some increase in liver enzymes- will possibly need to resume some level of diuresis- BNP 687 4 H/H pretty stable but dis drop some from yesterday 5 leukocytosis trending up- currently not on abx, rocephin stopped, no fevers  John Giovanni PA-C 03/26/2019  8:00 AM  Pager [phone]  re intubated for second time due to heavy secretions= informed consent for trach obtained from family  patient examined and medical record reviewed,agree with above note. Tharon Aquas Trigt III 03/26/2019

## 2019-03-26 NOTE — Progress Notes (Signed)
SLP Cancellation Note  Patient Details Name: Jesse Murphy MRN: [id number] DOB: 09-Oct-1928   Cancelled treatment:        Attempted to see pt for ongoing dysphagia management.  Pt reintubated over weekend and is not appropriate for PO trials at this time.  SLP will monitor to determine readiness for re-evaluation.   Celedonio Savage, MA, [place] Office: [phone]  03/26/2019, 8:59 AM

## 2019-03-26 NOTE — Progress Notes (Signed)
      [address]       [place] [address]             [phone]      POD # 10 CABG LAA clip  Re-intubated earlier today  Sedated  BP 105/74   Pulse (!) 105   Temp 98.6 F (37 C) (Oral)   Resp 15   Ht 5\' 8"  (1.727 m)   Wt 95.1 kg   SpO2 96%   BMI 31.88 kg/m  On levophed at 2 and Epi at 2   Intake/Output Summary (Last 24 hours) at 03/26/2019 1659 Last data filed at 03/26/2019 1600 Gross per 24 hour  Intake 1591.48 ml  Output 935 ml  Net 656.48 ml   Creatinine up slightly to 3.4 from 3.2 BUN up to 149 K= 4.1  For trach in AM  Theodosia C. Roxan Hockey, MD [facility] [phone]

## 2019-03-26 NOTE — Significant Event (Addendum)
PCCM OVERNIGHT  Called to bedside by E Link Severe Hypoxic Respiratory Failure ABG: 7.35/49/44/ 27  Currently on NIPPV Per RT patient was previously on 12/6 FiO2 50>>> 30 mins ago FiO2 70%>>>FiO2 increased to 100>>>14/7  Increased secretions Has an NGT receiving feedings no report of aspiration Awake but with increased work of breathing on increased NIPPV support Elink was present on screen  Appreciate their input Plan to endotracheally intubate for acute respiratory failure with hypoxia failing NIPPV   Signed Dr Seward Carol [facility] Critical [facility] Locums

## 2019-03-26 NOTE — Procedures (Signed)
Bronchoscopy Procedure Note AHMAR PICKRELL [id number] November 26, 1928  Procedure: Bronchoscopy Indications: Diagnostic evaluation of the airways, Obtain specimens for culture and/or other diagnostic studies and Remove secretions  Procedure Details Consent: Unable to obtain consent because of emergent medical necessity. Time Out: Verified patient identification, verified procedure, site/side was marked, verified correct patient position, special equipment/implants available, medications/allergies/relevent history reviewed, required imaging and test results available.  Performed  In preparation for procedure, patient was given 100% FiO2 and bronchoscope lubricated. Sedation: none additional post RSI  Post intubation thick purulent secretions seen in ETT Airway entered and the following bronchi were examined: Carina observed thick endobronchial secretions noted.  RUL, RML, RLL, LUL and LLL.  Also had secretions with some repooling. Procedures performed: BAL performed BAL performed in RML 20cc of saline introduced and good return of aliquot Bronchoscope removed, Patient placed back on 100% FiO2 at conclusion of procedure.    Evaluation Hemodynamic Status: Persistent hypotension treated with pressors and fluid; O2 sats: stable throughout Patient's Current Condition: stable post procedure Specimens:  Sent purulent fluid Complications: No apparent complications Patient did tolerate procedure well. CXR post completed and reviewed by me  Kandice Hams 03/26/2019

## 2019-03-26 NOTE — Progress Notes (Signed)
Dr. Orvan Seen was informed that the patient had to be re intubated this morning due to increased work of breathing (RR mid 30s-40s) and O2 sats in the 70s.

## 2019-03-26 NOTE — Progress Notes (Signed)
Daily Progress Note  Patient Details:    Jesse Murphy is an 86 y.o. male with a h/o CAD, afib, s/p CABG and clipping of left atrial appendage on 1/29 and transferred to CVICU with shortness of breath on 2/1.  Patient became hypoxic 2/2 pulm edema and needed to be intubated on 2/2. Following diuresis, patient was extubated 2/5.  Reintubated again on 2/8 due to respiratory failure.    PMHx: CAD, afib  Significant Hospital Events:  1/29 CABG + clipping left atrial appendage 2/1 transfer to CVICU 2/2 SOB 2/2 intubated 2/5 extubated 2/8 intubated  Significant Diagnostic Tests:  2/8 CXR shows bilateral pleural effusions and lower lobe collapse or consolidation; minimally displaced left anterior 2nd rib fracture stable across recent CXRs 2/7 CXR shows stable ventilation w/ suspected interstitial edema, bilateral pleural effusions and lung base atelectasis 2/5 CXR shows Persistent densities at the left lung base are suggestive for consolidation and probable small left pleural effusion. Stable mild interstitial edema. 2/4 CXR shows improving lung aeration with improving left effusion and atelectasis. 2/3 CXR shows mild improvement in pulm effusion, edema 2/2 CXR shows pulm edema 2/1 CXR shows likely pulm edema 1/29 Intraoperative Echo, EF of 45% 1/26 Preop Echo, EF of 30-35%  Micro Data: 2/8 sputum sent 2/1 sputum sent, culture consistent w/ normal resp flora 1/26 MRSA PCR screening negative 1/26 Covid negative  Antimicrobials: Ceftriaxone 2/1 - 2/7  Interim Hx/ Subjective: O/N Called for severe hypoxemic resp failure - was re-intubated.  Lines, Airways, Drains: Drain  Urethral Catheter Cyd Silence, RN 16 Fr. 5 days  Rectal Tube/Pouch 3 days  Wound  Incision (Closed) 03/16/19 Chest Other (Comment) 8 days  Incision (Closed) 03/16/19 Leg Right 8 days  Pressure Injury 03/24/19 Vertebral column Posterior;Mid;Upper Stage 1 - Intact skin with non-blanchable redness of a  localized area usually over a bony prominence. 2x2 stage 1 pressure injury (reddened) to vertebral column area. foam applied 1 day  PICC Line  PICC Triple Lumen 46/27/03 PICC Right Basilic 43 cm 0 cm 4 days  ART Line  Arterial Line 03/21/19 Left Radial 4 days  NG/OG Tube  Nasoenteric Feeding Tube [name] - 43 inches 10 Fr. Left nare Documented cm marking at nare/ corner of mouth 98 cm      Anti-infectives:  Anti-infectives (From admission, onward)   Start     Dose/Rate Route Frequency Ordered Stop   03/19/19 1000  cefTRIAXone (ROCEPHIN) 1 g in sodium chloride 0.9 % 100 mL IVPB     1 g 200 mL/hr over 30 Minutes Intravenous Daily 03/19/19 0810 03/25/19 1117   03/16/19 2230  vancomycin (VANCOCIN) IVPB 1000 mg/200 mL premix     1,000 mg 200 mL/hr over 60 Minutes Intravenous  Once 03/16/19 1721 03/17/19 0024   03/16/19 1830  cefUROXime (ZINACEF) 1.5 g in sodium chloride 0.9 % 100 mL IVPB     1.5 g 200 mL/hr over 30 Minutes Intravenous Every 12 hours 03/16/19 1721 03/18/19 0557   03/16/19 0400  vancomycin (VANCOREADY) IVPB 1500 mg/300 mL  Status:  Discontinued     1,500 mg 150 mL/hr over 120 Minutes Intravenous To Surgery 03/15/19 1425 03/16/19 1715   03/16/19 0400  cefUROXime (ZINACEF) 1.5 g in sodium chloride 0.9 % 100 mL IVPB  Status:  Discontinued     1.5 g 200 mL/hr over 30 Minutes Intravenous To Surgery 03/15/19 1425 03/16/19 1715   03/16/19 0400  cefUROXime (ZINACEF) 750 mg in sodium chloride 0.9 % 100 mL IVPB  750 mg 200 mL/hr over 30 Minutes Intravenous To Surgery 03/15/19 1425 03/16/19 1540      Microbiology: Results for orders placed or performed during the hospital encounter of 03/13/19  Respiratory Panel by RT PCR (Flu A&B, Covid) - Nasopharyngeal Swab     Status: None   Collection Time: 03/13/19  1:49 PM   Specimen: Nasopharyngeal Swab  Result Value Ref Range Status   SARS Coronavirus 2 by RT PCR NEGATIVE NEGATIVE Final    Comment: (NOTE) SARS-CoV-2 target  nucleic acids are NOT DETECTED. The SARS-CoV-2 RNA is generally detectable in upper respiratoy specimens during the acute phase of infection. The lowest concentration of SARS-CoV-2 viral copies this assay can detect is 131 copies/mL. A negative result does not preclude SARS-Cov-2 infection and should not be used as the sole basis for treatment or other patient management decisions. A negative result may occur with  improper specimen collection/handling, submission of specimen other than nasopharyngeal swab, presence of viral mutation(s) within the areas targeted by this assay, and inadequate number of viral copies (<131 copies/mL). A negative result must be combined with clinical observations, patient history, and epidemiological information. The expected result is Negative. Fact Sheet for Patients:  PinkCheek.be Fact Sheet for Healthcare Providers:  GravelBags.it This test is not yet ap proved or cleared by the [place] FDA and  has been authorized for detection and/or diagnosis of SARS-CoV-2 by FDA under an Emergency Use Authorization (EUA). This EUA will remain  in effect (meaning this test can be used) for the duration of the COVID-19 declaration under Section 564(b)(1) of the Act, 21 U.S.C. section 360bbb-3(b)(1), unless the authorization is terminated or revoked sooner.    Influenza A by PCR NEGATIVE NEGATIVE Final   Influenza B by PCR NEGATIVE NEGATIVE Final    Comment: (NOTE) The Xpert Xpress SARS-CoV-2/FLU/RSV assay is intended as an aid in  the diagnosis of influenza from Nasopharyngeal swab specimens and  should not be used as a sole basis for treatment. Nasal washings and  aspirates are unacceptable for Xpert Xpress SARS-CoV-2/FLU/RSV  testing. Fact Sheet for Patients: PinkCheek.be Fact Sheet for Healthcare Providers: GravelBags.it This test is not  yet approved or cleared by the [place] FDA and  has been authorized for detection and/or diagnosis of SARS-CoV-2 by  FDA under an Emergency Use Authorization (EUA). This EUA will remain  in effect (meaning this test can be used) for the duration of the  Covid-19 declaration under Section 564(b)(1) of the Act, 21  U.S.C. section 360bbb-3(b)(1), unless the authorization is  terminated or revoked. Performed at [facility], [address], [place], [place] [address]   MRSA PCR Screening     Status: None   Collection Time: 03/13/19  5:18 PM   Specimen: Nasal Mucosa; Nasopharyngeal  Result Value Ref Range Status   MRSA by PCR NEGATIVE NEGATIVE Final    Comment:        The GeneXpert MRSA Assay (FDA approved for NASAL specimens only), is one component of a comprehensive MRSA colonization surveillance program. It is not intended to diagnose MRSA infection nor to guide or monitor treatment for MRSA infections. Performed at [facility], [address] [place], [place] [address]   Surgical pcr screen     Status: None   Collection Time: 03/15/19  9:28 PM   Specimen: Nasal Mucosa; Nasal Swab  Result Value Ref Range Status   MRSA, PCR NEGATIVE NEGATIVE Final   Staphylococcus aureus NEGATIVE NEGATIVE Final    Comment: (NOTE)  The Xpert SA Assay (FDA approved for NASAL specimens in patients 63 years of age and older), is one component of a comprehensive surveillance program. It is not intended to diagnose infection nor to guide or monitor treatment. Performed at [facility], [place] [address], [place], [place] [address]   Culture, respiratory (non-expectorated)     Status: None   Collection Time: 03/19/19  3:00 PM   Specimen: Tracheal Aspirate; Respiratory  Result Value Ref Range Status   Specimen Description TRACHEAL ASPIRATE  Final   Special Requests Normal  Final   Gram Stain   Final    ABUNDANT WBC PRESENT,BOTH PMN AND MONONUCLEAR FEW GRAM POSITIVE COCCI RARE GRAM  NEGATIVE RODS    Culture   Final    FEW Consistent with normal respiratory flora. Performed at [facility], [place] [address], [place], [place] [address]    Report Status 03/21/2019 FINAL  Final    Objective:  Vital signs for last 24 hours: Temp:  [97.6 F (36.4 C)-97.9 F (36.6 C)] 97.9 F (36.6 C) (02/08 0338) Pulse Rate:  [78-161] 125 (02/08 0700) Resp:  [14-39] 14 (02/08 0715) BP: (85-132)/(40-105) 120/63 (02/08 0715) SpO2:  [75 %-100 %] 100 % (02/08 0804) Arterial Line BP: (47-251)/(41-97) 125/62 (02/08 0715) FiO2 (%):  [60 %-100 %] 60 % (02/08 0804)  Hemodynamic parameters for last 24 hours:    Intake/Output from previous day: 02/07 0701 - 02/08 0700 In: 1255.3 [P.O.:60; I.V.:145.3; NG/GT:1050] Out: 1190 [Urine:790; Emesis/NG output:200; Stool:200]  Intake/Output this shift: No intake/output data recorded.   Physical Exam:  General: intubated, appears weak Neuro: rouses to voice initially, then becomes somnolent HEENT/Neck: ET WNL Resp: coarse crackles bilaterally CVS:  irregularly irregular rhythm and tachy GI: soft, non-tender, distended Skin: no rash, warm & dry Extremities: edema in UEs and LEs  Assessment/Plan:  Hypercarbic Hypoxic Respiratory Failure 2/2 cardiogenic pulmonary edema.  Re-intubated 2/8.  Remains total body water overloaded, but he has increasing creatinine and suspect he is intravascular volume contracted -hold diuresis today -consider trach  Remains critically ill due to cardiogenic shock requiring titration of inotropes and vasopressors secondary to HFrEF. -On epi 2mg /min, levophed 32mcg/min -precedex 0.4   Critically ill due to atrial fibrillation with rapid ventricular response -metoprolol to 25mg  3 times daily -amio gtt -milrinone d/ced 2/7  AKI has worsened despite aggressive diuresis.  Suspect intravascular volume contraction -Diuretic holiday today -BMP at 4PM  Leukocytosis: Mild leukocytosis (14.6) today.  Recently  finished 7 day course of CTX (2/1-2/7).  Given recent physiologic stress, suspect may be left shift.  Will CTM, but low concern for infection at this time. -F/u resp culture  Daily Goals Checklist  Pain/Anxiety/Delirium protocol (if indicated): Oxycodone as needed Respiratory support goals: Wean O2 as tolerated.  Encourage incentive spirometry.  Progressive mobilization. Blood pressure target: Continue norepinephrine for renal perfusion at this time DVT prophylaxis: Lovenox 30 mg daily Nutrition Status: Nutrition Problem: Increased nutrient needs Etiology: post-op healing Signs/Symptoms: estimated needs Interventions: Tube feeding, Prostat do not believe he will be able to maintain sufficient oral intake. GI prophylaxis: No longer indicated Fluid status goals: Diuresis at this time Urinary catheter: Required for retention and edema. Central lines: Continue current central lines until off vasoactive agents Glucose control: We will increase tube feed coverage. Mobility/therapy needs: Progressive ambulation PT consult, SLP consult Antibiotic de-escalation: Complete 7 days antibiotic for suspected pneumonia Home medication reconciliation: Resuming home metoprolol, continuing home Protonix.  Restart home Flomax once able to swallow.  Daily labs: BMP daily Code Status: Full code Family Communication: Updated by cardiac surgery Disposition: ICU until infusions discontinued.  CRITICAL CARE Performed by: Mallie Darting, MS4   Total critical care time: 40 minutes  Critical care time was exclusive of separately billable procedures and treating other patients.  Critical care was necessary to treat or prevent imminent or life-threatening deterioration.  Critical care was time spent personally by me on the following activities: development of treatment plan with patient and/or surrogate as well as nursing, discussions with consultants, evaluation of patient's response to treatment, examination  of patient, obtaining history from patient or surrogate, ordering and performing treatments and interventions, ordering and review of laboratory studies, ordering and review of radiographic studies, pulse oximetry, re-evaluation of patient's condition and participation in multidisciplinary rounds.

## 2019-03-26 NOTE — Progress Notes (Signed)
Rehab Admissions Coordinator Note:  Per PT recommendation, this patient was screened by Raechel Ache for appropriateness for an Inpatient Acute Rehab Consult.   Noted pt was re-intubated this morning. As pt is not medically ready for consult, AC will not follow. Please contact AC once pt becomes medically appropriate and if he still has CIR needs.   Raechel Ache 03/26/2019, 8:04 AM  I can be reached at [phone].

## 2019-03-26 NOTE — Procedures (Signed)
Intubation Procedure Note Jesse Murphy [id number] 08-Nov-1928  Procedure: Intubation Indications: Respiratory insufficiency  Procedure Details Consent: Unable to obtain consent because of emergent medical necessity. Time Out: Verified patient identification, verified procedure, site/side was marked, verified correct patient position, special equipment/implants available, medications/allergies/relevent history reviewed, required imaging and test results available.  Performed  Maximum sterile technique was used including gloves, hand hygiene and mask.  4    Evaluation Hemodynamic Status: Transient hypotension treated with pressors and fluid; O2 sats: stable throughout Patient's Current Condition: stable Complications: No apparent complications Patient did not tolerate procedure well. Chest X-ray ordered to verify placement.  CXR: tube position acceptable.   Jesse Murphy 03/26/2019

## 2019-03-26 NOTE — Progress Notes (Signed)
Progress Note  Patient Name: Jesse Murphy Date of Encounter: 03/26/2019  Primary Cardiologist: Kate Sable, MD   Subjective   He was reintubated last evening.  Have been extubated on Friday.  Inpatient Medications    Scheduled Meds: . aspirin EC  81 mg Oral Daily  . atorvastatin  80 mg Oral q1800  . bisacodyl  10 mg Oral Daily   Or  . bisacodyl  10 mg Rectal Daily  . chlorhexidine gluconate (MEDLINE KIT)  15 mL Mouth Rinse BID  . Chlorhexidine Gluconate Cloth  6 each Topical Daily  . docusate  200 mg Oral Daily  . feeding supplement (PRO-STAT SUGAR FREE 64)  30 mL Per Tube TID  . heparin  5,000 Units Subcutaneous Q12H  . insulin aspart  0-24 Units Subcutaneous Q4H  . insulin glargine  14 Units Subcutaneous BID  . levalbuterol  0.63 mg Nebulization Q6H  . lidocaine  5 mL Intradermal Once  . mouth rinse  15 mL Mouth Rinse 10 times per day  . metoprolol tartrate  25 mg Per Tube TID  . pantoprazole (PROTONIX) IV  40 mg Intravenous Q24H  . psyllium  1 packet Per Tube BID  . sodium chloride flush  10-40 mL Intracatheter Q12H  . tamsulosin  0.4 mg Oral Daily   Continuous Infusions: . sodium chloride    . sodium chloride    . sodium chloride 10 mL/hr at 03/25/19 0733  . amiodarone    . dexmedetomidine (PRECEDEX) IV infusion 0.4 mcg/kg/hr (03/26/19 0836)  . epinephrine 2 mcg/min (03/26/19 6060)  . feeding supplement (VITAL AF 1.2 CAL) 1,000 mL (03/26/19 0212)  . norepinephrine (LEVOPHED) Adult infusion 2 mcg/min (03/26/19 0829)   PRN Meds: sodium chloride, dextrose, docusate, fentaNYL (SUBLIMAZE) injection, fentaNYL (SUBLIMAZE) injection, midazolam, ondansetron (ZOFRAN) IV, oxyCODONE, sodium chloride flush, sodium chloride flush   Vital Signs    Vitals:   03/26/19 0700 03/26/19 0715 03/26/19 0800 03/26/19 0804  BP: 107/63 120/63    Pulse: (!) 125     Resp: 19 14    Temp:      TempSrc:      SpO2: 99%   100%  Weight:      Height:   '5\' 8"'  (1.727 m)      Intake/Output Summary (Last 24 hours) at 03/26/2019 0459 Last data filed at 03/26/2019 0600 Gross per 24 hour  Intake 1035.21 ml  Output 1090 ml  Net -54.79 ml   Last 3 Weights 03/25/2019 03/24/2019 03/23/2019  Weight (lbs) 209 lb 10.5 oz 208 lb 1.8 oz 208 lb 8.9 oz  Weight (kg) 95.1 kg 94.4 kg 94.6 kg      Telemetry    Atrial fibrillation with rapid ventricular response- Personally Reviewed  ECG    A new tracing is not available to personally Review/  Physical Exam  Chronically ill-appearing, intubated GEN: No acute distress.   Neck: No JVD Cardiac:  Distant heart sounds, irregularly irregular and rapid Respiratory: Clear to auscultation bilaterally. GI: Soft, nontender, non-distended  MS: No edema; No deformity. Neuro:  Nonfocal  Psych: Normal affect   Labs    High Sensitivity Troponin:   Recent Labs  Lab 03/13/19 1056 03/13/19 1251 03/13/19 2257 03/14/19 0402  TROPONINIHS 32* 440* 926* 585*      Chemistry Recent Labs  Lab 03/24/19 0500 03/24/19 1826 03/25/19 0335 03/25/19 0335 03/25/19 1617 03/26/19 0626 03/26/19 0649  NA 139   < > 136   < > 137 135 134*  K 4.1   < > 3.7   < > 4.2 3.9 4.6  CL 99  --  98  --  95* 96*  --   CO2 25  --  27  --   --  25  --   GLUCOSE 239*  --  228*  --  159* 287*  --   BUN 93*  --  110*  --  124* 135*  --   CREATININE 2.45*  --  2.63*  --  2.80* 3.20*  --   CALCIUM 8.2*  --  7.9*  --   --  7.3*  --   PROT 5.9*  --  6.0*  --   --  5.6*  --   ALBUMIN 2.6*  --  2.4*  --   --  2.2*  --   AST 60*  --  83*  --   --  137*  --   ALT 50*  --  65*  --   --  106*  --   ALKPHOS 104  --  67  --   --  63  --   BILITOT 0.5  --  0.7  --   --  0.5  --   GFRNONAA 23*  --  21*  --   --  16*  --   GFRAA 26*  --  24*  --   --  19*  --   ANIONGAP 15  --  11  --   --  14  --    < > = values in this interval not displayed.     Hematology Recent Labs  Lab 03/24/19 0500 03/24/19 1826 03/25/19 0335 03/25/19 0335 03/25/19 1617  03/26/19 0626 03/26/19 0649  WBC 14.8*  --  13.2*  --   --  14.6*  --   RBC 3.04*  --  2.98*  --   --  2.84*  --   HGB 8.6*   < > 8.5*   < > 10.2* 8.1* 8.8*  HCT 27.4*   < > 28.0*   < > 30.0* 26.4* 26.0*  MCV 90.1  --  94.0  --   --  93.0  --   MCH 28.3  --  28.5  --   --  28.5  --   MCHC 31.4  --  30.4  --   --  30.7  --   RDW 15.5  --  15.8*  --   --  15.9*  --   PLT 348  --  315  --   --  343  --    < > = values in this interval not displayed.    BNP Recent Labs  Lab 03/22/19 0401 03/26/19 0626  BNP 340.4* 687.5*     DDimer No results for input(s): DDIMER in the last 168 hours.   Radiology    DG Chest 1 View  Result Date: 03/25/2019 CLINICAL DATA:  86 year old male postoperative day 9 status post CABG, left atrium MAZE procedure. EXAM: CHEST  1 VIEW COMPARISON:  Portable chest 03/24/2019 and earlier. FINDINGS: Portable AP semi upright view at 0409 hours. Enteric feeding tube remains in place, tip not included. Stable right PICC line. Stable lung volumes and ventilation with bibasilar veiling opacity and basilar predominant increased interstitial markings suggesting interstitial edema. No pneumothorax. Stable cardiac size and mediastinal contours. Paucity of bowel gas in the upper abdomen. IMPRESSION: 1. Stable lines and tubes. 2. Stable ventilation since yesterday with suspected interstitial edema, bilateral  pleural effusions and lung base atelectasis. Electronically Signed   By: Genevie Ann M.D.   On: 03/25/2019 06:53   DG Chest Port 1 View  Result Date: 03/26/2019 CLINICAL DATA:  86 year old male postoperative day 10 status post CABG, left atrium MAZE procedure. EXAM: PORTABLE CHEST 1 VIEW COMPARISON:  Portable chest 03/25/2019 and earlier. FINDINGS: Portable AP supine view at 0559 hours. Intubated. Endotracheal tube tip in good position just below the clavicles. Stable visible enteric tube, tip not included. Stable right PICC line. Stable cardiomegaly and mediastinal contours. Stable  pulmonary vascularity, no overt edema suspected. Veiling left greater than right lung base and dense retrocardiac opacity appears stable. No pneumothorax. There is a minimally displaced anterior left 2nd rib fracture which is stable in retrospect. IMPRESSION: 1. Intubated with ET tube tip in good position. Other lines and tubes appear stable. 2. Stable ventilation with evidence of bilateral pleural effusions and lower lobe collapse or consolidation. 3. Minimally displaced anterior left 2nd rib fracture, stable across this recent series of exams. Electronically Signed   By: Genevie Ann M.D.   On: 03/26/2019 06:31    Cardiac Studies    Cardiac catheterization demonstrated severe distal left main to ostial LAD 80% stenosis and severe right coronary disease.  Coronary bypass grafting x3 on 03/16/2019 with LIMA to LAD, SVG to OM, SVG to PDA, and left atrial appendage ligation.  Patient Profile     86 y.o. male with history of CAD, status post CABG, prior history of atrial for ablation, and hospital course complicated by respiratory failure and combined heart failure with post bypass EF 25%.  Assessment & Plan    1. Combined systolic and diastolic heart failure: Requiring inotropic and pressor support. 2. Atrial fibrillation with RVR: Receiving oral beta-blocker per NG tube and IV amiodarone with only moderate rate control 3. Hypoxic Respiratory failure: Possible combined etiology including infection and systolic and diastolic heart failure: Required reintubation over the weekend.  Most recent BNP 690. 4. Acute on chronic kidney failure stage IV: Possible component of cardiorenal syndrome.   Continue inotropic support, diuresis as possible, continue beta-blocker and amiodarone for A. fib control.      For questions or updates, please contact [facility] [place] Please consult [email or web address] for contact info under        Signed, Sinclair Grooms, MD  03/26/2019, 9:22 AM

## 2019-03-26 NOTE — Progress Notes (Signed)
eLink Physician-Brief Progress Note Patient Name: Jesse Murphy DOB: [birth year] MRN: [id number]   Date of Service  03/26/2019  HPI/Events of Note  Called for severe hypoxemic respiratory failure in this patient. He is on BiPAP on FiO2 1.0 and with SpO2 in mid 70s.   eICU Interventions  Immediately notified the bedside team. Ordered CXR STAT. MD came to bedside and assumed care at this time.     Intervention Category Major Interventions: Respiratory failure - evaluation and management  Marily Lente Verlie Liotta 03/26/2019, 5:25 AM

## 2019-03-27 ENCOUNTER — Inpatient Hospital Stay (HOSPITAL_COMMUNITY): Payer: Medicare Other

## 2019-03-27 ENCOUNTER — Encounter (HOSPITAL_COMMUNITY)
Admission: EM | Disposition: A | Discharge status: Rehabilitation Facility | Payer: Self-pay | Source: Home / Self Care | Attending: Cardiothoracic Surgery

## 2019-03-27 DIAGNOSIS — I2510 Atherosclerotic heart disease of native coronary artery without angina pectoris: Secondary | ICD-10-CM

## 2019-03-27 DIAGNOSIS — R570 Cardiogenic shock: Secondary | ICD-10-CM

## 2019-03-27 DIAGNOSIS — I5021 Acute systolic (congestive) heart failure: Secondary | ICD-10-CM

## 2019-03-27 DIAGNOSIS — I4891 Unspecified atrial fibrillation: Secondary | ICD-10-CM

## 2019-03-27 HISTORY — PX: PLACEMENT OF IMPELLA LEFT VENTRICULAR ASSIST DEVICE: SHX6519

## 2019-03-27 HISTORY — PX: TRACHEOSTOMY TUBE PLACEMENT: SHX814

## 2019-03-27 LAB — COMPREHENSIVE METABOLIC PANEL
ALT: 83 U/L — ABNORMAL HIGH (ref 0–44)
ALT: 81 U/L — ABNORMAL HIGH (ref 0–44)
AST: 94 U/L — ABNORMAL HIGH (ref 15–41)
AST: 85 U/L — ABNORMAL HIGH (ref 15–41)
Albumin: 2.5 g/dL — ABNORMAL LOW (ref 3.5–5.0)
Albumin: 2.6 g/dL — ABNORMAL LOW (ref 3.5–5.0)
Alkaline Phosphatase: 50 U/L (ref 38–126)
Alkaline Phosphatase: 55 U/L (ref 38–126)
Anion gap: 18 — ABNORMAL HIGH (ref 5–15)
Anion gap: 17 — ABNORMAL HIGH (ref 5–15)
BUN: 154 mg/dL — ABNORMAL HIGH (ref 8–23)
BUN: 167 mg/dL — ABNORMAL HIGH (ref 8–23)
CO2: 23 mmol/L (ref 22–32)
CO2: 22 mmol/L (ref 22–32)
Calcium: 7.3 mg/dL — ABNORMAL LOW (ref 8.9–10.3)
Calcium: 7.4 mg/dL — ABNORMAL LOW (ref 8.9–10.3)
Chloride: 91 mmol/L — ABNORMAL LOW (ref 98–111)
Chloride: 95 mmol/L — ABNORMAL LOW (ref 98–111)
Creatinine, Ser: 3.71 mg/dL — ABNORMAL HIGH (ref 0.61–1.24)
Creatinine, Ser: 4.19 mg/dL — ABNORMAL HIGH (ref 0.61–1.24)
GFR calc Af Amer: 16 mL/min — ABNORMAL LOW (ref >60)
GFR calc Af Amer: 14 mL/min — ABNORMAL LOW (ref >60)
GFR calc non Af Amer: 14 mL/min — ABNORMAL LOW (ref >60)
GFR calc non Af Amer: 12 mL/min — ABNORMAL LOW (ref >60)
Glucose, Bld: 385 mg/dL — ABNORMAL HIGH (ref 70–99)
Glucose, Bld: 156 mg/dL — ABNORMAL HIGH (ref 70–99)
Potassium: 3.6 mmol/L (ref 3.5–5.1)
Potassium: 3.9 mmol/L (ref 3.5–5.1)
Sodium: 132 mmol/L — ABNORMAL LOW (ref 135–145)
Sodium: 134 mmol/L — ABNORMAL LOW (ref 135–145)
Total Bilirubin: 0.7 mg/dL (ref 0.3–1.2)
Total Bilirubin: 0.5 mg/dL (ref 0.3–1.2)
Total Protein: 5.3 g/dL — ABNORMAL LOW (ref 6.5–8.1)
Total Protein: 5.8 g/dL — ABNORMAL LOW (ref 6.5–8.1)

## 2019-03-27 LAB — RENAL FUNCTION PANEL
Albumin: 2.6 g/dL — ABNORMAL LOW (ref 3.5–5.0)
Anion gap: 18 — ABNORMAL HIGH (ref 5–15)
BUN: 166 mg/dL — ABNORMAL HIGH (ref 8–23)
CO2: 21 mmol/L — ABNORMAL LOW (ref 22–32)
Calcium: 7.4 mg/dL — ABNORMAL LOW (ref 8.9–10.3)
Chloride: 95 mmol/L — ABNORMAL LOW (ref 98–111)
Creatinine, Ser: 4.15 mg/dL — ABNORMAL HIGH (ref 0.61–1.24)
GFR calc Af Amer: 14 mL/min — ABNORMAL LOW (ref >60)
GFR calc non Af Amer: 12 mL/min — ABNORMAL LOW (ref >60)
Glucose, Bld: 164 mg/dL — ABNORMAL HIGH (ref 70–99)
Phosphorus: 7.9 mg/dL — ABNORMAL HIGH (ref 2.5–4.6)
Potassium: 3.8 mmol/L (ref 3.5–5.1)
Sodium: 134 mmol/L — ABNORMAL LOW (ref 135–145)

## 2019-03-27 LAB — POCT I-STAT 7, (LYTES, BLD GAS, ICA,H+H)
Acid-base deficit: 2 mmol/L (ref 0.0–2.0)
Acid-base deficit: 3 mmol/L — ABNORMAL HIGH (ref 0.0–2.0)
Acid-base deficit: 2 mmol/L (ref 0.0–2.0)
Bicarbonate: 24.4 mmol/L (ref 20.0–28.0)
Bicarbonate: 24 mmol/L (ref 20.0–28.0)
Bicarbonate: 23.8 mmol/L (ref 20.0–28.0)
Calcium, Ion: 1.05 mmol/L — ABNORMAL LOW (ref 1.15–1.40)
Calcium, Ion: 1.07 mmol/L — ABNORMAL LOW (ref 1.15–1.40)
Calcium, Ion: 1.03 mmol/L — ABNORMAL LOW (ref 1.15–1.40)
HCT: 25 % — ABNORMAL LOW (ref 39.0–52.0)
HCT: 31 % — ABNORMAL LOW (ref 39.0–52.0)
HCT: 29 % — ABNORMAL LOW (ref 39.0–52.0)
Hemoglobin: 8.5 g/dL — ABNORMAL LOW (ref 13.0–17.0)
Hemoglobin: 10.5 g/dL — ABNORMAL LOW (ref 13.0–17.0)
Hemoglobin: 9.9 g/dL — ABNORMAL LOW (ref 13.0–17.0)
O2 Saturation: 94 %
O2 Saturation: 94 %
O2 Saturation: 96 %
Patient temperature: 36.1
Patient temperature: 35.8
Patient temperature: 35.9
Potassium: 3.7 mmol/L (ref 3.5–5.1)
Potassium: 3.9 mmol/L (ref 3.5–5.1)
Potassium: 3.8 mmol/L (ref 3.5–5.1)
Sodium: 135 mmol/L (ref 135–145)
Sodium: 134 mmol/L — ABNORMAL LOW (ref 135–145)
Sodium: 134 mmol/L — ABNORMAL LOW (ref 135–145)
TCO2: 26 mmol/L (ref 22–32)
TCO2: 25 mmol/L (ref 22–32)
TCO2: 25 mmol/L (ref 22–32)
pCO2 arterial: 50.2 mmHg — ABNORMAL HIGH (ref 32.0–48.0)
pCO2 arterial: 47.4 mmHg (ref 32.0–48.0)
pCO2 arterial: 42.1 mmHg (ref 32.0–48.0)
pH, Arterial: 7.29 — ABNORMAL LOW (ref 7.350–7.450)
pH, Arterial: 7.306 — ABNORMAL LOW (ref 7.350–7.450)
pH, Arterial: 7.355 (ref 7.350–7.450)
pO2, Arterial: 76 mmHg — ABNORMAL LOW (ref 83.0–108.0)
pO2, Arterial: 75 mmHg — ABNORMAL LOW (ref 83.0–108.0)
pO2, Arterial: 79 mmHg — ABNORMAL LOW (ref 83.0–108.0)

## 2019-03-27 LAB — CBC
HCT: 22 % — ABNORMAL LOW (ref 39.0–52.0)
HCT: 30.1 % — ABNORMAL LOW (ref 39.0–52.0)
Hemoglobin: 6.9 g/dL — CL (ref 13.0–17.0)
Hemoglobin: 9.6 g/dL — ABNORMAL LOW (ref 13.0–17.0)
MCH: 28.8 pg (ref 26.0–34.0)
MCH: 28.5 pg (ref 26.0–34.0)
MCHC: 31.4 g/dL (ref 30.0–36.0)
MCHC: 31.9 g/dL (ref 30.0–36.0)
MCV: 91.7 fL (ref 80.0–100.0)
MCV: 89.3 fL (ref 80.0–100.0)
Platelets: 285 10*3/uL (ref 150–400)
Platelets: 298 10*3/uL (ref 150–400)
RBC: 2.4 MIL/uL — ABNORMAL LOW (ref 4.22–5.81)
RBC: 3.37 MIL/uL — ABNORMAL LOW (ref 4.22–5.81)
RDW: 15.9 % — ABNORMAL HIGH (ref 11.5–15.5)
RDW: 15.5 % (ref 11.5–15.5)
WBC: 11.3 10*3/uL — ABNORMAL HIGH (ref 4.0–10.5)
WBC: 15 10*3/uL — ABNORMAL HIGH (ref 4.0–10.5)
nRBC: 0 % (ref 0.0–0.2)
nRBC: 0.1 % (ref 0.0–0.2)

## 2019-03-27 LAB — PROTIME-INR
INR: 1.7 — ABNORMAL HIGH (ref 0.8–1.2)
Prothrombin Time: 19.5 seconds — ABNORMAL HIGH (ref 11.4–15.2)

## 2019-03-27 LAB — ECHO INTRAOPERATIVE TEE
Height: 68 in
Weight: 3545 oz

## 2019-03-27 LAB — COOXEMETRY PANEL
Carboxyhemoglobin: 1.2 % (ref 0.5–1.5)
Methemoglobin: 0.7 % (ref 0.0–1.5)
O2 Saturation: 59.3 %
Total hemoglobin: 8 g/dL — ABNORMAL LOW (ref 12.0–16.0)

## 2019-03-27 LAB — GLUCOSE, CAPILLARY
Glucose-Capillary: 108 mg/dL — ABNORMAL HIGH (ref 70–99)
Glucose-Capillary: 110 mg/dL — ABNORMAL HIGH (ref 70–99)
Glucose-Capillary: 118 mg/dL — ABNORMAL HIGH (ref 70–99)
Glucose-Capillary: 149 mg/dL — ABNORMAL HIGH (ref 70–99)
Glucose-Capillary: 154 mg/dL — ABNORMAL HIGH (ref 70–99)
Glucose-Capillary: 161 mg/dL — ABNORMAL HIGH (ref 70–99)

## 2019-03-27 LAB — FIBRINOGEN: Fibrinogen: 608 mg/dL — ABNORMAL HIGH (ref 210–475)

## 2019-03-27 LAB — BRAIN NATRIURETIC PEPTIDE: B Natriuretic Peptide: 800.4 pg/mL — ABNORMAL HIGH (ref 0.0–100.0)

## 2019-03-27 LAB — PREPARE RBC (CROSSMATCH)

## 2019-03-27 LAB — LACTIC ACID, PLASMA
Lactic Acid, Venous: 1 mmol/L (ref 0.5–1.9)
Lactic Acid, Venous: 0.9 mmol/L (ref 0.5–1.9)

## 2019-03-27 LAB — LACTATE DEHYDROGENASE: LDH: 242 U/L — ABNORMAL HIGH (ref 98–192)

## 2019-03-27 SURGERY — CREATION, TRACHEOSTOMY
Anesthesia: General | Site: Neck | Laterality: Right

## 2019-03-27 MED ORDER — 0.9 % SODIUM CHLORIDE (POUR BTL) OPTIME
TOPICAL | Status: DC | PRN
  Administered 2019-03-27: 2000 mL

## 2019-03-27 MED ORDER — FUROSEMIDE 10 MG/ML IJ SOLN
80.0000 mg | Freq: Once | INTRAMUSCULAR | Status: AC
  Administered 2019-03-27: 80 mg via INTRAVENOUS
  Filled 2019-03-27: qty 8

## 2019-03-27 MED ORDER — DEXTROSE 5 % SOLN FOR IMPELLA PURGE CATHETER
INTRAVENOUS | Status: DC
  Filled 2019-03-27: qty 1000

## 2019-03-27 MED ORDER — SODIUM CHLORIDE 0.45 % IV SOLN: INTRAVENOUS | Status: DC

## 2019-03-27 MED ORDER — MIDAZOLAM HCL 2 MG/2ML IJ SOLN
INTRAMUSCULAR | Status: DC | PRN
  Administered 2019-03-27: 2 mg via INTRAVENOUS

## 2019-03-27 MED ORDER — HEPARIN SODIUM (PORCINE) 5000 UNIT/ML IJ SOLN
25000.0000 [IU] | INTRAVENOUS | Status: DC
  Administered 2019-03-27 – 2019-03-28 (×2): 25000 [IU]
  Filled 2019-03-27 (×3): qty 5

## 2019-03-27 MED ORDER — HEMOSTATIC AGENTS (NO CHARGE) OPTIME
TOPICAL | Status: DC | PRN
  Administered 2019-03-27: 1 via TOPICAL

## 2019-03-27 MED ORDER — HEPARIN SODIUM (PORCINE) 1000 UNIT/ML IJ SOLN
INTRAMUSCULAR | Status: AC
  Filled 2019-03-27: qty 1

## 2019-03-27 MED ORDER — SODIUM CHLORIDE 0.9 % IV SOLN: 10.0000 mL/h | Freq: Once | INTRAVENOUS | Status: DC

## 2019-03-27 MED ORDER — SODIUM CHLORIDE 0.9 % IV SOLN
INTRAVENOUS | Status: DC | PRN
  Administered 2019-03-27: 500 mL

## 2019-03-27 MED ORDER — FUROSEMIDE 10 MG/ML IJ SOLN
20.0000 mg | Freq: Once | INTRAMUSCULAR | Status: DC
  Filled 2019-03-27: qty 2

## 2019-03-27 MED ORDER — FUROSEMIDE 10 MG/ML IJ SOLN
30.0000 mg/h | INTRAVENOUS | Status: DC
  Administered 2019-03-27: 5 mg/h via INTRAVENOUS
  Administered 2019-03-28 (×2): 20 mg/h via INTRAVENOUS
  Administered 2019-03-29 (×2): 30 mg/h via INTRAVENOUS
  Filled 2019-03-27 (×4): qty 25
  Filled 2019-03-27: qty 21
  Filled 2019-03-27: qty 25

## 2019-03-27 MED ORDER — METOLAZONE 5 MG PO TABS
5.0000 mg | ORAL_TABLET | Freq: Once | ORAL | Status: AC
  Administered 2019-03-27: 17:00:00 5 mg
  Filled 2019-03-27: qty 1

## 2019-03-27 MED ORDER — ROCURONIUM BROMIDE 10 MG/ML (PF) SYRINGE
PREFILLED_SYRINGE | INTRAVENOUS | Status: DC | PRN
  Administered 2019-03-27 (×2): 50 mg via INTRAVENOUS

## 2019-03-27 MED ORDER — PRO-STAT SUGAR FREE PO LIQD
60.0000 mL | Freq: Two times a day (BID) | ORAL | Status: DC
  Administered 2019-03-28 – 2019-03-30 (×5): 60 mL
  Filled 2019-03-27 (×5): qty 60

## 2019-03-27 MED ORDER — HEPARIN SODIUM (PORCINE) 1000 UNIT/ML IJ SOLN
INTRAMUSCULAR | Status: DC | PRN
  Administered 2019-03-27: 1000 [IU]

## 2019-03-27 MED ORDER — CEFAZOLIN SODIUM-DEXTROSE 2-3 GM-%(50ML) IV SOLR
INTRAVENOUS | Status: DC | PRN
  Administered 2019-03-27: 2 g via INTRAVENOUS

## 2019-03-27 MED ORDER — SODIUM CHLORIDE 0.9 % IV SOLN
INTRAVENOUS | Status: AC
  Filled 2019-03-27: qty 1.2

## 2019-03-27 MED ORDER — VITAL AF 1.2 CAL PO LIQD: 1000.0000 mL | ORAL | Status: DC

## 2019-03-27 MED ORDER — INSULIN GLARGINE 100 UNIT/ML ~~LOC~~ SOLN
18.0000 [IU] | Freq: Every day | SUBCUTANEOUS | Status: DC
  Administered 2019-03-28 – 2019-03-31 (×4): 18 [IU] via SUBCUTANEOUS
  Filled 2019-03-27 (×5): qty 0.18

## 2019-03-27 MED ORDER — METOLAZONE 2.5 MG PO TABS
2.5000 mg | ORAL_TABLET | Freq: Once | ORAL | Status: AC
  Administered 2019-03-27: 2.5 mg
  Filled 2019-03-27: qty 1

## 2019-03-27 MED ORDER — SODIUM CHLORIDE 0.9 % IV SOLN: INTRAVENOUS | Status: DC | PRN

## 2019-03-27 MED ORDER — HEPARIN SODIUM (PORCINE) 1000 UNIT/ML IJ SOLN
INTRAMUSCULAR | Status: DC | PRN
  Administered 2019-03-27: 5000 [IU] via INTRAVENOUS

## 2019-03-27 MED ORDER — FENTANYL CITRATE (PF) 250 MCG/5ML IJ SOLN
INTRAMUSCULAR | Status: DC | PRN
  Administered 2019-03-27 (×5): 50 ug via INTRAVENOUS

## 2019-03-27 MED ORDER — SODIUM CHLORIDE 0.9% IV SOLUTION: Freq: Once | INTRAVENOUS | Status: DC

## 2019-03-27 MED ORDER — PHENYLEPHRINE 40 MCG/ML (10ML) SYRINGE FOR IV PUSH (FOR BLOOD PRESSURE SUPPORT)
PREFILLED_SYRINGE | INTRAVENOUS | Status: DC | PRN
  Administered 2019-03-27: 80 ug via INTRAVENOUS

## 2019-03-27 SURGICAL SUPPLY — 79 items
ANCHOR CATH FOLEY SECURE (MISCELLANEOUS) ×9 IMPLANT
APPLIER CLIP 9.375 SM OPEN (CLIP)
BAG DECANTER FOR FLEXI CONT (MISCELLANEOUS) ×3 IMPLANT
BLADE CLIPPER SURG (BLADE) IMPLANT
BLADE SURG 10 STRL SS (BLADE) IMPLANT
BLADE SURG 11 STRL SS (BLADE) ×3 IMPLANT
CANISTER SUCT 3000ML PPV (MISCELLANEOUS) ×3 IMPLANT
CATH ACCU-VU SIZ PIG 5F 100CM (CATHETERS) ×3 IMPLANT
CATH DIAG EXPO 6F FR4 (CATHETERS) ×3 IMPLANT
CLIP APPLIE 9.375 SM OPEN (CLIP) IMPLANT
CLIP VESOCCLUDE MED 24/CT (CLIP) ×3 IMPLANT
CLIP VESOCCLUDE MED 6/CT (CLIP) ×3 IMPLANT
CLIP VESOCCLUDE SM WIDE 24/CT (CLIP) ×3 IMPLANT
COVER SURGICAL LIGHT HANDLE (MISCELLANEOUS) IMPLANT
DRAPE C-ARM 42X72 X-RAY (DRAPES) ×3 IMPLANT
DRAPE SURG IRRIG POUCH 19X23 (DRAPES) ×3 IMPLANT
DRSG AQUACEL AG ADV 3.5X 6 (GAUZE/BANDAGES/DRESSINGS) ×3 IMPLANT
DRSG AQUACEL AG ADV 3.5X14 (GAUZE/BANDAGES/DRESSINGS) ×3 IMPLANT
DRSG TEGADERM 4X4.5 CHG (GAUZE/BANDAGES/DRESSINGS) ×3 IMPLANT
ELECT BLADE 4.0 EZ CLEAN MEGAD (MISCELLANEOUS) ×3
ELECT CAUTERY BLADE 6.4 (BLADE) ×3 IMPLANT
ELECT REM PT RETURN 9FT ADLT (ELECTROSURGICAL) ×6
ELECTRODE BLDE 4.0 EZ CLN MEGD (MISCELLANEOUS) ×2 IMPLANT
ELECTRODE REM PT RTRN 9FT ADLT (ELECTROSURGICAL) ×4 IMPLANT
FELT TEFLON 1X6 (MISCELLANEOUS) ×3 IMPLANT
GAUZE 4X4 16PLY RFD (DISPOSABLE) IMPLANT
GLOVE BIO SURGEON STRL SZ 6.5 (GLOVE) ×6 IMPLANT
GLOVE BIO SURGEON STRL SZ7.5 (GLOVE) ×3 IMPLANT
GLOVE BIOGEL PI IND STRL 6.5 (GLOVE) ×6 IMPLANT
GLOVE BIOGEL PI INDICATOR 6.5 (GLOVE) ×3
GLOVE NEODERM STRL 7.5 LF PF (GLOVE) ×4 IMPLANT
GLOVE SURG NEODERM 7.5  LF PF (GLOVE) ×2
GOWN STRL REUS W/ TWL LRG LVL3 (GOWN DISPOSABLE) ×10 IMPLANT
GOWN STRL REUS W/TWL LRG LVL3 (GOWN DISPOSABLE) ×5
GRAFT HEMASHIELD 10MM (Graft) ×1 IMPLANT
GRAFT VASC STRG 30X10STRL (Graft) ×2 IMPLANT
HEMOSTAT SURGICEL 2X14 (HEMOSTASIS) IMPLANT
HOLDER TRACH TUBE VELCRO 19.5 (MISCELLANEOUS) ×3 IMPLANT
INSERT FOGARTY SM (MISCELLANEOUS) ×6 IMPLANT
KIT BASIN OR (CUSTOM PROCEDURE TRAY) ×3 IMPLANT
KIT SUCTION CATH 14FR (SUCTIONS) IMPLANT
KIT TURNOVER KIT B (KITS) ×3 IMPLANT
NEEDLE 18GX1X1/2 (RX/OR ONLY) (NEEDLE) ×3 IMPLANT
NEEDLE 22X1 1/2 (OR ONLY) (NEEDLE) IMPLANT
NS IRRIG 1000ML POUR BTL (IV SOLUTION) ×6 IMPLANT
PACK EENT II TURBAN DRAPE (CUSTOM PROCEDURE TRAY) IMPLANT
PACK UNIVERSAL I (CUSTOM PROCEDURE TRAY) ×3 IMPLANT
PAD ARMBOARD 7.5X6 YLW CONV (MISCELLANEOUS) ×6 IMPLANT
PENCIL BUTTON HOLSTER BLD 10FT (ELECTRODE) ×3 IMPLANT
PUMP SET IMPELLA 5.5 US (CATHETERS) ×3 IMPLANT
SEALANT SURG COSEAL 8ML (VASCULAR PRODUCTS) ×3 IMPLANT
SPONGE DRAIN TRACH 4X4 STRL 2S (GAUZE/BANDAGES/DRESSINGS) IMPLANT
SPONGE INTESTINAL PEANUT (DISPOSABLE) ×3 IMPLANT
STAPLER VISISTAT 35W (STAPLE) ×3 IMPLANT
SUT PROLENE 3 0 RB 1 (SUTURE) IMPLANT
SUT PROLENE 3 0 SH DA (SUTURE) ×3 IMPLANT
SUT PROLENE 4 0 RB 1 (SUTURE) ×1
SUT PROLENE 4-0 RB1 .5 CRCL 36 (SUTURE) ×2 IMPLANT
SUT PROLENE 5 0 C 1 36 (SUTURE) ×6 IMPLANT
SUT SILK  1 MH (SUTURE) ×1
SUT SILK 1 MH (SUTURE) ×2 IMPLANT
SUT SILK 2 0 SH CR/8 (SUTURE) ×3 IMPLANT
SUT SILK 2 0 TIES 10X30 (SUTURE) ×3 IMPLANT
SUT SILK 3 0 TIES 10X30 (SUTURE) IMPLANT
SUT VIC AB 2-0 CT1 27 (SUTURE) ×1
SUT VIC AB 2-0 CT1 TAPERPNT 27 (SUTURE) ×2 IMPLANT
SYR 10ML LL (SYRINGE) ×6 IMPLANT
SYR 30ML LL (SYRINGE) ×9 IMPLANT
SYR BULB IRRIGATION 50ML (SYRINGE) IMPLANT
SYR CONTROL 10ML LL (SYRINGE) IMPLANT
TOWEL GREEN STERILE (TOWEL DISPOSABLE) ×3 IMPLANT
TOWEL GREEN STERILE FF (TOWEL DISPOSABLE) IMPLANT
TUBE CONNECTING 12X1/4 (SUCTIONS) ×3 IMPLANT
TUBE TRACH EXLNGT  8 SNGL (TUBING)
TUBE TRACH EXLNGT 8 SNGL (TUBING) IMPLANT
TUBE TRACH SHILEY 8 DIST CUF (TUBING) ×3 IMPLANT
WATER STERILE IRR 1000ML POUR (IV SOLUTION) ×6 IMPLANT
WIRE EMERALD 3MM-J .035X260CM (WIRE) ×3 IMPLANT
WIRE G V18X300CM (WIRE) ×3 IMPLANT

## 2019-03-27 NOTE — Progress Notes (Signed)
Cardiology Follow Up   He is still in A. fib.  Severely anemic this morning, acute on chronic stage V CKD (cardiorenal syndrome).  Continue metoprolol and IV amiodarone.  Not an anticoagulation candidate.

## 2019-03-27 NOTE — Progress Notes (Signed)
Unit of blood finished by Anesthesia on arrival to pick patient up for OR.  Patient transferred to OR on monitor and ambubag via anesthesia. OR staff present for transfer as well.  RN will continue to monitor

## 2019-03-27 NOTE — Progress Notes (Signed)
Orthopedic Tech Progress Note Patient Details:  Jesse Murphy 05-03-1928 [id number]  Ortho Devices Type of Ortho Device: Haematologist Ortho Device/Splint Location: bilateral legs Ortho Device/Splint Interventions: Ordered, Application   Post Interventions Instructions Provided: Care of device, Adjustment of device   [name] 03/27/2019, 3:13 PM

## 2019-03-27 NOTE — Progress Notes (Signed)
CRITICAL VALUE ALERT  Critical Value:  Hemoglobin  Date & Time Notied: 03/27/19 0615  Provider Notified: Dr. Roxan Hockey  Orders Received/Actions taken:  Transfuse 1 unit PRBC  And give Lasix after transfusion

## 2019-03-27 NOTE — Anesthesia Procedure Notes (Signed)
Central Venous Catheter Insertion Performed by: Lillia Abed, MD, anesthesiologist Start/End2/10/2019 10:05 AM, 03/27/2019 10:15 AM Patient location: Pre-op. Preanesthetic checklist: patient identified, IV checked, risks and benefits discussed, surgical consent, monitors and equipment checked, pre-op evaluation, timeout performed and anesthesia consent Position: Trendelenburg Lidocaine 1% used for infiltration and patient sedated Hand hygiene performed  and maximum sterile barriers used  Catheter size: 8.5 Fr PA cath was placed.MAC introducer Swan type:thermodilution Procedure performed using ultrasound guided technique. Ultrasound Notes:anatomy identified, needle tip was noted to be adjacent to the nerve/plexus identified, no ultrasound evidence of intravascular and/or intraneural injection and image(s) printed for medical record Attempts: 1 Following insertion, line sutured and dressing applied. Post procedure assessment: blood return through all ports, free fluid flow and no air  Patient tolerated the procedure well with no immediate complications.

## 2019-03-27 NOTE — Progress Notes (Signed)
EVENING ROUNDS NOTE :     [address]       ,[place] [id number]             [phone]                 Day of Surgery Procedure(s) (LRB): TRACHEOSTOMY placed using Shiley 8DCT Cuffed. (N/A) Placement Of Impella Left Ventricular Assist Device using ABIOMED Impella 5.5 with SmartAssist Device. (Right)   Total Length of Stay:  LOS: 13 days  Events:  No events On P6, epi 1 levo 2.  Good hemodynamics     BP 108/73   Pulse 84   Temp (!) 97.5 F (36.4 C)   Resp 17   Ht 5\' 8"  (1.727 m)   Wt 100.5 kg   SpO2 96%   BMI 33.69 kg/m   PAP: (43-58)/(19-28) 46/24 CVP:  [11 mmHg-19 mmHg] 14 mmHg PCWP:  [25 mmHg] 25 mmHg CO:  [5.3 L/min-6 L/min] 5.7 L/min CI:  [2.5 L/min/m2-2.8 L/min/m2] 2.7 L/min/m2  Vent Mode: PRVC FiO2 (%):  [40 %-60 %] 60 % Set Rate:  [18 bmp] 18 bmp Vt Set:  [540 mL] 540 mL PEEP:  [5 cmH20] 5 cmH20 Plateau Pressure:  [14 cmH20-25 cmH20] 14 cmH20  . sodium chloride    . sodium chloride 50 mL/hr at 03/27/19 1700  . sodium chloride 10 mL/hr at 03/26/19 1153  . sodium chloride Stopped (03/27/19 0945)  . amiodarone 30 mg/hr (03/27/19 1700)  . ceFEPime (MAXIPIME) IV Stopped (03/26/19 1224)  . dexmedetomidine (PRECEDEX) IV infusion 0.4 mcg/kg/hr (03/27/19 1700)  . epinephrine 1 mcg/min (03/27/19 1700)  . feeding supplement (VITAL AF 1.2 CAL)    . furosemide (LASIX) infusion 20 mg/hr (03/27/19 1700)  . impella catheter heparin 25 unit/mL in dextrose 5%    . norepinephrine (LEVOPHED) Adult infusion 2 mcg/min (03/27/19 1700)    I/O last 3 completed shifts: In: 3069.1 [I.V.:1269.1; NG/GT:1400; IV Piggyback:400.1] Out: 1060 [Urine:860; Emesis/NG output:200]   CBC Latest Ref Rng & Units 03/27/2019 03/27/2019 03/27/2019  WBC 4.0 - 10.5 K/uL - - 15.0(H)  Hemoglobin 13.0 - 17.0 g/dL 9.9(L) 10.5(L) 9.6(L)  Hematocrit 39.0 - 52.0 % 29.0(L) 31.0(L) 30.1(L)  Platelets 150 - 400 K/uL - - 298    BMP Latest Ref Rng & Units 03/27/2019 03/27/2019 03/27/2019    Glucose 70 - 99 mg/dL - 164(H) -  BUN 8 - 23 mg/dL - 166(H) -  Creatinine 0.61 - 1.24 mg/dL - 4.15(H) -  Sodium 135 - 145 mmol/L 134(L) 134(L) 134(L)  Potassium 3.5 - 5.1 mmol/L 3.8 3.8 3.9  Chloride 98 - 111 mmol/L - 95(L) -  CO2 22 - 32 mmol/L - 21(L) -  Calcium 8.9 - 10.3 mg/dL - 7.4(L) -    ABG    Component Value Date/Time   PHART 7.355 03/27/2019 1621   PCO2ART 42.1 03/27/2019 1621   PO2ART 79.0 (L) 03/27/2019 1621   HCO3 23.8 03/27/2019 1621   TCO2 25 03/27/2019 1621   ACIDBASEDEF 2.0 03/27/2019 1621   O2SAT 96.0 03/27/2019 1621       Destina Mantei, MD 03/27/2019 5:40 PM

## 2019-03-27 NOTE — Progress Notes (Signed)
Daily Progress Note  Patient Details:    Jesse Murphy is an 86 y.o. male with a h/o CAD, afib, s/p CABG and clipping of left atrial appendage on 1/29 and transferred to CVICU with shortness of breath on 2/1.  Patient became hypoxic 2/2 pulm edema and needed to be intubated on 2/2. Following diuresis, patient was extubated 2/5.  Reintubated again on 2/8 due to respiratory failure.    PMHx: CAD, afib  Significant Hospital Events:  1/29 CABG + clipping left atrial appendage 2/1 transfer to CVICU 2/2 SOB 2/2 intubated 2/5 extubated 2/8 intubated  Significant Diagnostic Tests:  2/8 CXR shows bilateral pleural effusions and lower lobe collapse or consolidation; minimally displaced left anterior 2nd rib fracture stable across recent CXRs 2/7 CXR shows stable ventilation w/ suspected interstitial edema, bilateral pleural effusions and lung base atelectasis 2/5 CXR shows Persistent densities at the left lung base are suggestive for consolidation and probable small left pleural effusion. Stable mild interstitial edema. 2/4 CXR shows improving lung aeration with improving left effusion and atelectasis. 2/3 CXR shows mild improvement in pulm effusion, edema 2/2 CXR shows pulm edema 2/1 CXR shows likely pulm edema 1/29 Intraoperative Echo, EF of 45% 1/26 Preop Echo, EF of 30-35%  Micro Data: 2/8 sputum sent 2/1 sputum sent, culture consistent w/ normal resp flora 1/26 MRSA PCR screening negative 1/26 Covid negative  Antimicrobials: Ceftriaxone 2/1 - 2/7  Interim Hx/ Subjective: O/N Called for severe hypoxemic resp failure - was re-intubated.  Lines, Airways, Drains: Drain  Urethral Catheter Cyd Silence, RN 16 Fr. 5 days  Rectal Tube/Pouch 3 days  Wound  Incision (Closed) 03/16/19 Chest Other (Comment) 8 days  Incision (Closed) 03/16/19 Leg Right 8 days  Pressure Injury 03/24/19 Vertebral column Posterior;Mid;Upper Stage 1 - Intact skin with non-blanchable redness of a  localized area usually over a bony prominence. 2x2 stage 1 pressure injury (reddened) to vertebral column area. foam applied 1 day  PICC Line  PICC Triple Lumen 03/47/42 PICC Right Basilic 43 cm 0 cm 4 days  ART Line  Arterial Line 03/21/19 Left Radial 4 days  NG/OG Tube  Nasoenteric Feeding Tube [name] - 43 inches 10 Fr. Left nare Documented cm marking at nare/ corner of mouth 98 cm      Anti-infectives:  Anti-infectives (From admission, onward)   Start     Dose/Rate Route Frequency Ordered Stop   03/26/19 1200  ceFEPIme (MAXIPIME) 2 g in sodium chloride 0.9 % 100 mL IVPB     2 g 200 mL/hr over 30 Minutes Intravenous Daily 03/26/19 1104     03/19/19 1000  cefTRIAXone (ROCEPHIN) 1 g in sodium chloride 0.9 % 100 mL IVPB     1 g 200 mL/hr over 30 Minutes Intravenous Daily 03/19/19 0810 03/25/19 1117   03/16/19 2230  vancomycin (VANCOCIN) IVPB 1000 mg/200 mL premix     1,000 mg 200 mL/hr over 60 Minutes Intravenous  Once 03/16/19 1721 03/17/19 0024   03/16/19 1830  cefUROXime (ZINACEF) 1.5 g in sodium chloride 0.9 % 100 mL IVPB     1.5 g 200 mL/hr over 30 Minutes Intravenous Every 12 hours 03/16/19 1721 03/18/19 0557   03/16/19 0400  vancomycin (VANCOREADY) IVPB 1500 mg/300 mL  Status:  Discontinued     1,500 mg 150 mL/hr over 120 Minutes Intravenous To Surgery 03/15/19 1425 03/16/19 1715   03/16/19 0400  cefUROXime (ZINACEF) 1.5 g in sodium chloride 0.9 % 100 mL IVPB  Status:  Discontinued  1.5 g 200 mL/hr over 30 Minutes Intravenous To Surgery 03/15/19 1425 03/16/19 1715   03/16/19 0400  cefUROXime (ZINACEF) 750 mg in sodium chloride 0.9 % 100 mL IVPB     750 mg 200 mL/hr over 30 Minutes Intravenous To Surgery 03/15/19 1425 03/16/19 1540      Microbiology: Results for orders placed or performed during the hospital encounter of 03/13/19  Respiratory Panel by RT PCR (Flu A&B, Covid) - Nasopharyngeal Swab     Status: None   Collection Time: 03/13/19  1:49 PM   Specimen:  Nasopharyngeal Swab  Result Value Ref Range Status   SARS Coronavirus 2 by RT PCR NEGATIVE NEGATIVE Final    Comment: (NOTE) SARS-CoV-2 target nucleic acids are NOT DETECTED. The SARS-CoV-2 RNA is generally detectable in upper respiratoy specimens during the acute phase of infection. The lowest concentration of SARS-CoV-2 viral copies this assay can detect is 131 copies/mL. A negative result does not preclude SARS-Cov-2 infection and should not be used as the sole basis for treatment or other patient management decisions. A negative result may occur with  improper specimen collection/handling, submission of specimen other than nasopharyngeal swab, presence of viral mutation(s) within the areas targeted by this assay, and inadequate number of viral copies (<131 copies/mL). A negative result must be combined with clinical observations, patient history, and epidemiological information. The expected result is Negative. Fact Sheet for Patients:  PinkCheek.be Fact Sheet for Healthcare Providers:  GravelBags.it This test is not yet ap proved or cleared by the [place] FDA and  has been authorized for detection and/or diagnosis of SARS-CoV-2 by FDA under an Emergency Use Authorization (EUA). This EUA will remain  in effect (meaning this test can be used) for the duration of the COVID-19 declaration under Section 564(b)(1) of the Act, 21 U.S.C. section 360bbb-3(b)(1), unless the authorization is terminated or revoked sooner.    Influenza A by PCR NEGATIVE NEGATIVE Final   Influenza B by PCR NEGATIVE NEGATIVE Final    Comment: (NOTE) The Xpert Xpress SARS-CoV-2/FLU/RSV assay is intended as an aid in  the diagnosis of influenza from Nasopharyngeal swab specimens and  should not be used as a sole basis for treatment. Nasal washings and  aspirates are unacceptable for Xpert Xpress SARS-CoV-2/FLU/RSV  testing. Fact Sheet for  Patients: PinkCheek.be Fact Sheet for Healthcare Providers: GravelBags.it This test is not yet approved or cleared by the [place] FDA and  has been authorized for detection and/or diagnosis of SARS-CoV-2 by  FDA under an Emergency Use Authorization (EUA). This EUA will remain  in effect (meaning this test can be used) for the duration of the  Covid-19 declaration under Section 564(b)(1) of the Act, 21  U.S.C. section 360bbb-3(b)(1), unless the authorization is  terminated or revoked. Performed at [facility], [address], [place], [place] [address]   MRSA PCR Screening     Status: None   Collection Time: 03/13/19  5:18 PM   Specimen: Nasal Mucosa; Nasopharyngeal  Result Value Ref Range Status   MRSA by PCR NEGATIVE NEGATIVE Final    Comment:        The GeneXpert MRSA Assay (FDA approved for NASAL specimens only), is one component of a comprehensive MRSA colonization surveillance program. It is not intended to diagnose MRSA infection nor to guide or monitor treatment for MRSA infections. Performed at [facility], [address] [place], [place] [address]   Surgical pcr screen     Status: None   Collection Time: 03/15/19  9:28 PM   Specimen: Nasal Mucosa; Nasal Swab  Result Value Ref Range Status   MRSA, PCR NEGATIVE NEGATIVE Final   Staphylococcus aureus NEGATIVE NEGATIVE Final    Comment: (NOTE) The Xpert SA Assay (FDA approved for NASAL specimens in patients 66 years of age and older), is one component of a comprehensive surveillance program. It is not intended to diagnose infection nor to guide or monitor treatment. Performed at [facility], [place] [address] [place], [place] [address]   Culture, respiratory (non-expectorated)     Status: None   Collection Time: 03/19/19  3:00 PM   Specimen: Tracheal Aspirate; Respiratory  Result Value Ref Range Status   Specimen Description TRACHEAL ASPIRATE   Final   Special Requests Normal  Final   Gram Stain   Final    ABUNDANT WBC PRESENT,BOTH PMN AND MONONUCLEAR FEW GRAM POSITIVE COCCI RARE GRAM NEGATIVE RODS    Culture   Final    FEW Consistent with normal respiratory flora. Performed at [facility], [place] [address], [place], [place] [address]    Report Status 03/21/2019 FINAL  Final    Objective:  Vital signs for last 24 hours: Temp:  [97.7 F (36.5 C)-98.6 F (37 C)] 98.2 F (36.8 C) (02/09 0802) Pulse Rate:  [72-125] 103 (02/09 0725) Resp:  [15-37] 25 (02/09 0725) BP: (97-129)/(21-94) 127/54 (02/09 0725) SpO2:  [92 %-98 %] 95 % (02/09 0720) Arterial Line BP: (65-142)/(40-63) 130/53 (02/09 0710) FiO2 (%):  [40 %-50 %] 40 % (02/09 0720) Weight:  [100.5 kg] 100.5 kg (02/09 0500)  Hemodynamic parameters for last 24 hours: CVP:  [11 mmHg-16 mmHg] 15 mmHg  Intake/Output from previous day: 02/08 0701 - 02/09 0700 In: 2602.2 [I.V.:1252.1; NG/GT:950; IV Piggyback:400.1] Out: 650 [Urine:650]  Intake/Output this shift: No intake/output data recorded.   Physical Exam:  General: intubated, appears weak Neuro: rouses to voice initially, then becomes somnolent HEENT/Neck: ET WNL Resp: rhonchi bilaterally  CVS:  irregularly irregular rhythm and tachy GI: soft, non-tender, distended Skin: no rash, warm & dry Extremities: edema in UEs and LEs - increasing  Assessment/Plan:  Hypercarbic Hypoxic Respiratory Failure 2/2 cardiogenic pulmonary edema.  Re-intubated 2/8.  Remains total body water overloaded, and is receiving a large amount of fluids.  - continue full ventilatory support.   Remains critically ill due to cardiogenic shock requiring titration of inotropes and vasopressors secondary to HFrEF. -On epi 2mg /min, levophed 68mcg/min -precedex 0.4   Critically ill due to atrial fibrillation with rapid ventricular response -metoprolol to 25mg  3 times daily -amio gtt -milrinone d/ced 2/7  AKI has worsened despite  aggressive diuresis. Marked volume overload. no improvement in creatinine with diuretic holiday. Suspect hypoperfusion from poor cardiac function(cardiorenal syndrome) - furosemide infusion and zaroxylyn. -BMP at 4PM  Hemodilutional anemia/ anemia of chronic illness. No signs of bleeding. - transfusion today.  Daily Goals Checklist  Pain/Anxiety/Delirium protocol (if indicated): Oxycodone as needed Respiratory support goals: Full ventilatory support. Blood pressure target: Continue norepinephrine for renal perfusion at this time, epinephrine for inotropic support.  prophylaxis: Lovenox 30 mg daily Nutrition Status: Nutrition Problem: Increased nutrient needs Etiology: post-op healing Signs/Symptoms: estimated needs Interventions: Tube feeding, Prostat do not believe he will be able to maintain sufficient oral intake. GI prophylaxis: PPI Fluid status goals: Furosemide infusion. Urinary catheter: Required for retention and edema. Central lines: Continue current central lines until off vasoactive agents Glucose control: We will increase tube feed coverage. Mobility/therapy needs: Up to chair as tolerated.  Antibiotic de-escalation: Completed 7 days antibiotic for suspected pneumonia Home medication reconciliation: on hold while intubated. Daily labs: CBC, CMP  Code Status: Full code Family Communication: Updated by cardiac surgery Disposition: ICU until infusions discontinued.  CRITICAL CARE Performed by: Kipp Brood, MS4   Total critical care time: 40 minutes  Critical care time was exclusive of separately billable procedures and treating other patients.  Critical care was necessary to treat or prevent imminent or life-threatening deterioration.  Critical care was time spent personally by me on the following activities: development of treatment plan with patient and/or surrogate as well as nursing, discussions with consultants, evaluation of patient's response to treatment,  examination of patient, obtaining history from patient or surrogate, ordering and performing treatments and interventions, ordering and review of laboratory studies, ordering and review of radiographic studies, pulse oximetry, re-evaluation of patient's condition and participation in multidisciplinary rounds.

## 2019-03-27 NOTE — Consult Note (Addendum)
Advanced Heart Failure Team Consult Note   Primary Physician: Sandi Mealy, MD PCP-Cardiologist:  Kate Sable, MD  Reason for Consultation: Advanced HF  HPI:    Jesse Murphy is seen today for evaluation of post-op HF at the request of Dr. Orvan Seen.   Jesse Murphy is a 86 y.o. male with past medical history of CAD (s/p PCI in 1992, significant CAD by Coronary CT in 86/1683, chronic diastolic CHF, paroxysmal atrial fibrillation, HTN, HLD, follicular lymphoma and history of prostate cancer.  He presented to [facility] ED on 03/13/2019 for tachypalpitations and CP. Per EMS report, HR was initially close to 200 bpm and he was given IV Cardizem 49m with rate improving to 150. EKG shows atrial fibrillation with RVR, HR 145 with known LBBB. DCCV was attempted at 100 J and 200 J in the ED but he remained in atrial fibrillation. A repeat echocardiogram was obtained and showed his EF is now reduced to 30-35%, previously 55-60% by echo in 12/2017.  R/L cath 03/15/19  Dist LM to Ost LAD lesion is 80% stenosed.  Ost LM to Dist LM lesion is 50% stenosed.  Mid Cx lesion is 30% stenosed.  Prox RCA-1 lesion is 60% stenosed.  Prox RCA-2 lesion is 99% stenosed.  RA 10 RV 47/9 PA 43/12 (21) PCW 21 Fick 4.7/2.3  Underwent CABG on 03/16/19 LIMA -> LAD, SVG -> OM1, SVG -> PDA) with Maze and LAA occlusion.   Post-CABG had rocky course with AKI and respiratory failure requiring re-intubation. Echo 2/2 25-30% RV ok.   Earlier today underwent tracheostomy and Impella 5.5 placement. Converted to NSR in OR.  HF team asked to see.   Swan #s CVP 19 PA 58/28  Thermo 5.3/2.5 SVR 1108  VAD flows stable on P-^. Waveforms ok.    Review of Systems: Unavaillble as patient intubated    Home Medications Prior to Admission medications   Medication Sig Start Date End Date Taking? Authorizing Provider  acetaminophen (TYLENOL) 325 MG tablet Take 325 mg by mouth every 8 (eight) hours as  needed for moderate pain.    Yes [provider]  atorvastatin (LIPITOR) 10 MG tablet Take 10 mg by mouth.  09/14/18 09/14/19 Yes [provider]  fluticasone (FLONASE) 50 MCG/ACT nasal spray daily as needed.  10/08/14  Yes [provider]  furosemide (LASIX) 20 MG tablet Take 20 mg by mouth daily.  10/13/17  Yes [provider]  loratadine (CLARITIN) 10 MG tablet Take 10 mg by mouth daily.   Yes [provider]  metoprolol tartrate (LOPRESSOR) 50 MG tablet Take 1 tablet (50 mg total) by mouth 2 (two) times daily. 10/03/18 03/14/19 Yes Strader, BFransisco Hertz PA-C  pantoprazole (PROTONIX) 40 MG tablet Take 1 tablet (40 mg total) by mouth daily. 09/14/18  Yes Elgergawy, DSilver Huguenin MD  Pediatric Multivitamins-Iron (FLINTSTONES PLUS IRON) chewable tablet Chew 1 tablet by mouth 2 (two) times daily. 08/25/17  Yes Rehman, NMechele Dawley MD  rivaroxaban (XARELTO) 20 MG TABS tablet Take 1 tablet (20 mg total) by mouth daily with supper. 08/28/17  Yes Rehman, NMechele Dawley MD  tamsulosin (FLOMAX) 0.4 MG CAPS capsule Take by mouth. 01/15/19  Yes [provider]    Past Medical History: Past Medical History:  Diagnosis Date  . Atrial fibrillation ([place]   . CAD (coronary artery disease)    a. s/p PCI in 1992 b. Coronary CT in 01/2018 showing extensive coronary calcification; FFR indeterminate --> medical management  pursued at that time as patient not interested in repeat cath  . Cancer [facility])    prostate  . Dyspnea   . Hypertension     Past Surgical History: Past Surgical History:  Procedure Laterality Date  . CLIPPING OF ATRIAL APPENDAGE N/A 03/16/2019   Procedure: CLIPPING OF LEFT  ATRIAL APPENDAGE - USING ATRICLIP SIZE 40;  Surgeon: Ivin Poot, MD;  Location: [place];  Service: Open Heart Surgery;  Laterality: N/A;  . COLONOSCOPY N/A 08/25/2017   Procedure: COLONOSCOPY;  Surgeon: Rogene Houston, MD;  [facility];  Service: Endoscopy;  Laterality:  N/A;  . CORONARY ARTERY BYPASS GRAFT N/A 03/16/2019   Procedure: CORONARY ARTERY BYPASS GRAFTING (CABG), ON PUMP, TIMES THREE, USING LEFT INTERNAL MAMMARY ARTERY, RIGHT GREATER SAPHENOUS VEIN HARVESTED ENDOSCOPICALLY;  Surgeon: Ivin Poot, MD;  Location: [place];  Service: Open Heart Surgery;  Laterality: N/A;  swan only  . HERNIA REPAIR    . MAZE N/A 03/16/2019   Procedure: MAZE;  Surgeon: Ivin Poot, MD;  Location: [place];  Service: Open Heart Surgery;  Laterality: N/A;  . PROSTATE SURGERY    . RIGHT/LEFT HEART CATH AND CORONARY ANGIOGRAPHY N/A 03/15/2019   Procedure: RIGHT/LEFT HEART CATH AND CORONARY ANGIOGRAPHY;  Surgeon: Burnell Blanks, MD;  Location: [place] CV LAB;  Service: Cardiovascular;  Laterality: N/A;  . TEE WITHOUT CARDIOVERSION N/A 03/16/2019   Procedure: TRANSESOPHAGEAL ECHOCARDIOGRAM (TEE);  Surgeon: Prescott Gum, Collier Salina, MD;  Location: [place];  Service: Open Heart Surgery;  Laterality: N/A;    Family History: Family History  Problem Relation Age of Onset  . CAD Mother 75  . CAD Father 4  . CAD Sister   . CAD Brother     Social History: Social History   Socioeconomic History  . Marital status: Married    Spouse name: Not on file  . Number of children: 2  . Years of education: Not on file  . Highest education level: Not on file  Occupational History  . Occupation: Librarian, academic in supply    Comment: [place]  Tobacco Use  . Smoking status: Former Smoker    Packs/day: 0.25    Years: 50.00    Pack years: 12.50    Types: Cigarettes    Quit date: 02/15/1990    Years since quitting: 29.1  . Smokeless tobacco: Never Used  Substance and Sexual Activity  . Alcohol use: No  . Drug use: No  . Sexual activity: Not Currently  Other Topics Concern  . Not on file  Social History Narrative  . Not on file   Social Determinants of Health   Financial Resource Strain:   . Difficulty of Paying Living Expenses: Not on file  Food Insecurity:   . Worried  About Charity fundraiser in the Last Year: Not on file  . Ran Out of Food in the Last Year: Not on file  Transportation Needs:   . Lack of Transportation (Medical): Not on file  . Lack of Transportation (Non-Medical): Not on file  Physical Activity:   . Days of Exercise per Week: Not on file  . Minutes of Exercise per Session: Not on file  Stress:   . Feeling of Stress : Not on file  Social Connections:   . Frequency of Communication with Friends and Family: Not on file  . Frequency of Social Gatherings with Friends and Family: Not on file  . Attends Religious Services: Not on file  . Active  Member of Clubs or Organizations: Not on file  . Attends Archivist Meetings: Not on file  . Marital Status: Not on file    Allergies:  No Known Allergies  Objective:    Vital Signs:   Temp:  [96.3 F (35.7 C)-98.6 F (37 C)] 96.3 F (35.7 C) (02/09 1315) Pulse Rate:  [72-122] 84 (02/09 1315) Resp:  [16-37] 22 (02/09 1315) BP: (97-129)/(21-94) 127/54 (02/09 0725) SpO2:  [92 %-97 %] 96 % (02/09 1315) Arterial Line BP: (65-153)/(40-97) 137/76 (02/09 1315) FiO2 (%):  [40 %] 40 % (02/09 1303) Weight:  [100.5 kg] 100.5 kg (02/09 0500) Last BM Date: (P) 03/27/19  Weight change: Filed Weights   03/24/19 0500 03/25/19 0342 03/27/19 0500  Weight: 94.4 kg 95.1 kg 100.5 kg    Intake/Output:   Intake/Output Summary (Last 24 hours) at 03/27/2019 1331 Last data filed at 03/27/2019 1300 Gross per 24 hour  Intake 2772.41 ml  Output 900 ml  Net 1872.41 ml      Physical Exam    General:  Elderly male. SedatedOn vent through trach HEENT: normal Neck: supple. RIJ swan Carotids 2+ bilat; no bruits. No lymphadenopathy or thyromegaly appreciated. Cor: R axillary Impella. Sternal dressing ok PMI nondisplaced. Regular rate & rhythm. No rubs, gallops or murmurs. Lungs: clear Abdomen: soft, nontender, nondistended. No hepatosplenomegaly. No bruits or masses. Good bowel  sounds. Extremities: no cyanosis, clubbing, rash, 3+ edema Neuro: sedated on vent    Telemetry   A paced 80s. Personally reviewed   Labs   Basic Metabolic Panel: Recent Labs  Lab 03/20/19 1724 03/20/19 1745 03/21/19 0208 03/21/19 0400 03/21/19 1654 03/21/19 1757 03/22/19 1540 03/22/19 1718 03/24/19 0500 03/24/19 1826 03/25/19 0335 03/25/19 0335 03/25/19 1617 03/26/19 0626 03/26/19 0649 03/26/19 1437 03/27/19 0514 03/27/19 1043  NA 136   < > 132*   < > 139   < > 140   < > 139   < > 136   < > 137 135 134* 136 132* 135  K 3.6   < > 3.2*   < > 3.2*   < > 3.7   < > 4.1   < > 3.7   < > 4.2 3.9 4.6 4.1 3.6 3.7  CL 99   < > 96*   < > 100   < > 98   < > 99  --  98  --  95* 96*  --  91* 91*  --   CO2 22   < > 24   < > 25   < > 26   < > 25  --  27  --   --  25  --  25 23  --   GLUCOSE 165*   < > 372*   < > 201*   < > 197*   < > 239*  --  228*  --  159* 287*  --  196* 385*  --   BUN 48*   < > 50*   < > 55*   < > 64*   < > 93*  --  110*  --  124* 135*  --  149* 154*  --   CREATININE 1.97*   < > 1.78*   < > 1.84*   < > 1.86*   < > 2.45*  --  2.63*  --  2.80* 3.20*  --  3.41* 3.71*  --   CALCIUM 7.5*   < > 7.1*   < > 7.6*   < >  7.9*   < > 8.2*  --  7.9*   < >  --  7.3*  --  7.8* 7.3*  --   MG 2.5*  --  2.2  --  2.4  --   --   --   --   --   --   --   --  2.2  --   --   --   --   PHOS 3.4   < > 2.7  --  2.4*  --  2.8  --   --   --   --   --   --  6.8*  --  6.3*  --   --    < > = values in this interval not displayed.    Liver Function Tests: Recent Labs  Lab 03/23/19 0350 03/23/19 0350 03/24/19 0500 03/25/19 0335 03/26/19 0626 03/26/19 1437 03/27/19 0514  AST 49*  --  60* 83* 137*  --  94*  ALT 41  --  50* 65* 106*  --  83*  ALKPHOS 66  --  104 67 63  --  50  BILITOT 1.0  --  0.5 0.7 0.5  --  0.7  PROT 5.5*  --  5.9* 6.0* 5.6*  --  5.3*  ALBUMIN 2.6*   < > 2.6* 2.4* 2.2* 2.4* 2.5*   < > = values in this interval not displayed.   No results for input(s): LIPASE, AMYLASE  in the last 168 hours. No results for input(s): AMMONIA in the last 168 hours.  CBC: Recent Labs  Lab 03/23/19 0350 03/23/19 0408 03/24/19 0500 03/24/19 1826 03/25/19 0335 03/25/19 0335 03/25/19 1617 03/26/19 0626 03/26/19 0649 03/27/19 0514 03/27/19 1043  WBC 11.5*  --  14.8*  --  13.2*  --   --  14.6*  --  11.3*  --   HGB 8.2*   < > 8.6*   < > 8.5*   < > 10.2* 8.1* 8.8* 6.9* 8.5*  HCT 26.1*   < > 27.4*   < > 28.0*   < > 30.0* 26.4* 26.0* 22.0* 25.0*  MCV 90.9  --  90.1  --  94.0  --   --  93.0  --  91.7  --   PLT 301  --  348  --  315  --   --  343  --  285  --    < > = values in this interval not displayed.    Cardiac Enzymes: No results for input(s): CKTOTAL, CKMB, CKMBINDEX, TROPONINI in the last 168 hours.  BNP: BNP (last 3 results) Recent Labs    09/12/18 0942 03/22/19 0401 03/26/19 0626  BNP 112.0* 340.4* 687.5*    ProBNP (last 3 results) No results for input(s): PROBNP in the last 8760 hours.   CBG: Recent Labs  Lab 03/26/19 2003 03/26/19 2150 03/26/19 2353 03/27/19 0401 03/27/19 0806  GLUCAP 162* 181* 187* 110* 154*    Coagulation Studies: No results for input(s): LABPROT, INR in the last 72 hours.   Imaging   DG Chest Port 1 View  Result Date: 03/27/2019 CLINICAL DATA:  Status post tracheostomy placement EXAM: PORTABLE CHEST 1 VIEW COMPARISON:  03/27/2019 FINDINGS: Cardiac shadow is prominent but stable. Impella device is noted in place. Postsurgical changes are again seen and stable. Right-sided PICC line is noted at the cavoatrial junction. New tracheostomy tube is seen. Feeding catheter has been removed in the interval. Diffuse haziness is noted over both lungs likely  related to a posteriorly layering effusion bilaterally. Mild vascular congestion is again seen. Swan-Ganz catheter is noted in satisfactory position. IMPRESSION: Mild vascular congestion and small effusions posteriorly. Tubes and lines as described. Electronically Signed   By:  Inez Catalina M.D.   On: 03/27/2019 13:16   DG Chest Port 1 View  Result Date: 03/27/2019 CLINICAL DATA:  Status post coronary bypass graft EXAM: PORTABLE CHEST 1 VIEW COMPARISON:  March 26, 2019. FINDINGS: Stable cardiomegaly with central pulmonary vascular congestion. Endotracheal tube is in good position. Enteric tube is noted which appears to extend into stomach. Right-sided PICC line is unchanged. No pneumothorax is noted. Stable bilateral lung opacities are noted concerning for edema atelectasis or inflammation with associated small pleural effusions. Bony thorax is unremarkable. IMPRESSION: Stable support apparatus. Stable bilateral lung opacities are noted concerning for edema or inflammation with associated small pleural effusions. Electronically Signed   By: Marijo Conception M.D.   On: 03/27/2019 09:07   DG Abd Portable 1V  Result Date: 03/27/2019 CLINICAL DATA:  Blocks feeding tube. EXAM: PORTABLE ABDOMEN - 1 VIEW COMPARISON:  None. FINDINGS: The bowel gas pattern is normal. Feeding tube is seen looped within the stomach with distal tip in the expected position of the midportion of the stomach. No radio-opaque calculi or other significant radiographic abnormality are seen. IMPRESSION: Feeding tube tip seen in expected position of stomach. No evidence of bowel obstruction or ileus. Electronically Signed   By: Marijo Conception M.D.   On: 03/27/2019 09:08   DG Fluoro Guide CV Line-No Report  Result Date: 03/27/2019 Fluoroscopy was utilized by the requesting physician.  No radiographic interpretation.      Medications:     Current Medications: . sodium chloride   Intravenous Once  . aspirin  81 mg Per Tube Daily  . atorvastatin  80 mg Per Tube q1800  . bethanechol  5 mg Per Tube TID  . bisacodyl  10 mg Oral Daily   Or  . bisacodyl  10 mg Rectal Daily  . chlorhexidine gluconate (MEDLINE KIT)  15 mL Mouth Rinse BID  . Chlorhexidine Gluconate Cloth  6 each Topical Daily  . docusate  200  mg Per Tube Daily  . feeding supplement (PRO-STAT SUGAR FREE 64)  30 mL Per Tube TID  . insulin aspart  0-24 Units Subcutaneous Q4H  . insulin glargine  18 Units Subcutaneous BID  . levalbuterol  0.63 mg Nebulization Q6H  . lidocaine  5 mL Intradermal Once  . mouth rinse  15 mL Mouth Rinse 10 times per day  . metoprolol tartrate  12.5 mg Per Tube BID  . pantoprazole (PROTONIX) IV  40 mg Intravenous Q24H  . psyllium  1 packet Per Tube BID  . sodium chloride flush  10-40 mL Intracatheter Q12H     Infusions: . sodium chloride    . sodium chloride 50 mL/hr at 03/27/19 0956  . sodium chloride 10 mL/hr at 03/26/19 1153  . sodium chloride Stopped (03/27/19 0945)  . amiodarone 30 mg/hr (03/27/19 0956)  . ceFEPime (MAXIPIME) IV Stopped (03/26/19 1224)  . dexmedetomidine (PRECEDEX) IV infusion 0.4 mcg/kg/hr (03/27/19 0956)  . dextrose 5 % Impella 5.0 Purge solution    . epinephrine 2 mcg/min (03/27/19 1300)  . feeding supplement (VITAL AF 1.2 CAL) 50 mL/hr at 03/26/19 0800  . furosemide (LASIX) infusion 5 mg/hr (03/27/19 1304)  . norepinephrine (LEVOPHED) Adult infusion 2 mcg/min (03/27/19 1155)      Assessment/Plan   1.  Acute systolic HF -> Cardiogenic shock - post-op echo on 03/20/19 EF 25-30% (pre-op 30-35%. In 12/2017 EF normal) - Outputs now normal with Impella support. Can start milrinone as needed - Markedly volume overloaded. On lasix gtt. Increase drip as tolerated. May need CVVHD - No b,block, ACE/ARB, spiro or dig with shock and renal failure currently  2. CAD s/p CABG this admit (LIMA->LAD, SVG->OM, SVG->RCA on 03/15/19) - No s/s angina - Continue ASA/statin  3. Acute hypoxic respiratory failure - s/p trach on 03/27/19 - CCM managing  4. PAF - s/p MAZE and LAA occlusion - back in NSR today.  - continue IV amio  5. AKI  - due to shock/ATN - creatinine 0.90-> 3.7 - hopefully will improve with hemodynamic support - may need CVVHD   CRITICAL CARE Performed by:  Glori Bickers  Total critical care time: 55 minutes  Critical care time was exclusive of separately billable procedures and treating other patients.  Critical care was necessary to treat or prevent imminent or life-threatening deterioration.  Critical care was time spent personally by me (independent of midlevel providers or residents) on the following activities: development of treatment plan with patient and/or surrogate as well as nursing, discussions with consultants, evaluation of patient's response to treatment, examination of patient, obtaining history from patient or surrogate, ordering and performing treatments and interventions, ordering and review of laboratory studies, ordering and review of radiographic studies, pulse oximetry and re-evaluation of patient's condition.    Length of Stay: 13  Glori Bickers, MD  03/27/2019, 1:31 PM  Advanced Heart Failure Team Pager [phone] (M-F; [place])  Please contact [facility] for night-coverage after hours (4p -7a ) and weekends on [email or web address]

## 2019-03-27 NOTE — [place] Notes (Signed)
Hold tube feeds for now given elevated LFTs and distended abdomen per Dr. Orvan Seen.  RN will continue to monitor.

## 2019-03-27 NOTE — [place] Notes (Signed)
SLP Cancellation/discharge Note  Patient Details Name: Jesse Murphy MRN: [id number] DOB: [birth year]   Cancelled treatment:  Pt reintubated; for trach.  Will sign off and await new orders when appropriate.          Juan Quam Laurice 03/27/2019, 8:08 AM

## 2019-03-27 NOTE — Transfer of Care (Signed)
Immediate Anesthesia Transfer of Care Note  Patient: Jesse Murphy  Procedure(s) Performed: TRACHEOSTOMY placed using Shiley 8DCT Cuffed. (N/A Neck) Placement Of Impella Left Ventricular Assist Device using ABIOMED Impella 5.5 with SmartAssist Device. (Right Chest)  Patient Location: PACU  Anesthesia Type:General  Level of Consciousness: sedated and Patient remains intubated per anesthesia plan  Airway & Oxygen Therapy: Patient remains intubated per anesthesia plan and Patient placed on Ventilator (see vital sign flow sheet for setting)  Post-op Assessment: Report given to RN and Post -op Vital signs reviewed and stable  Post vital signs: Reviewed and stable  Last Vitals:  Vitals Value Taken Time  BP    Temp    Pulse    Resp    SpO2      Last Pain:  Vitals:   03/27/19 0802  TempSrc: Oral  PainSc:       Patients Stated Pain Goal: 0 (32/12/24 8250)  Complications: No apparent anesthesia complications

## 2019-03-27 NOTE — Anesthesia Preprocedure Evaluation (Signed)
Anesthesia Evaluation  Patient identified by MRN, date of birth, ID band Patient unresponsive    Reviewed: Allergy & Precautions, Patient's Chart, lab work & pertinent test results, Unable to perform ROS - Chart review only  Airway Mallampati: Intubated       Dental   Pulmonary former smoker,    Pulmonary exam normal        Cardiovascular hypertension, + CAD and + Past MI  Normal cardiovascular exam+ dysrhythmias      Neuro/Psych    GI/Hepatic GERD  Medicated and Controlled,  Endo/Other    Renal/GU Renal InsufficiencyRenal disease     Musculoskeletal   Abdominal   Peds  Hematology   Anesthesia Other Findings   Reproductive/Obstetrics                             Anesthesia Physical Anesthesia Plan  ASA: IV  Anesthesia Plan: General   Post-op Pain Management:    Induction: Intravenous  PONV Risk Score and Plan: Treatment may vary due to age or medical condition  Airway Management Planned: Oral ETT  Additional Equipment: PA Cath  Intra-op Plan:   Post-operative Plan: Post-operative intubation/ventilation  Informed Consent: I have reviewed the patients History and Physical, chart, labs and discussed the procedure including the risks, benefits and alternatives for the proposed anesthesia with the patient or authorized representative who has indicated his/her understanding and acceptance.       Plan Discussed with: CRNA and Surgeon  Anesthesia Plan Comments:         Anesthesia Quick Evaluation

## 2019-03-27 NOTE — [place] Notes (Signed)
PT Cancellation Note  Patient Details Name: Jesse Murphy MRN: [id number] DOB: 07-01-1928   Cancelled Treatment:    Reason Eval/Treat Not Completed: Patient at procedure or test/unavailable pt off floor in OR to get impella. Will follow.   Marguarite Arbour A Briahnna Harries 03/27/2019, 12:12 PM Marisa Severin, PT, DPT Acute Rehabilitation Services Pager [phone] Office [phone]

## 2019-03-27 NOTE — [place] Notes (Signed)
  Echocardiogram 2D Echocardiogram has been performed.  Jesse Murphy 03/27/2019, 10:39 AM

## 2019-03-27 NOTE — Op Note (Signed)
Procedure(s): TRACHEOSTOMY placed using Shiley 8DCT Cuffed. Placement Of Impella Left Ventricular Assist Device using ABIOMED Impella 5.5 with SmartAssist Device. Procedure Note  Jesse Murphy male 86 y.o. 03/27/2019  Procedure(s) and Anesthesia Type:    * TRACHEOSTOMY placed using Shiley 8DCT Cuffed. - General    * Placement Of Impella Left Ventricular Assist Device using ABIOMED Impella 5.5 with SmartAssist Device. - General  Surgeon(s) and Role:    * Wonda Olds, MD - Primary   Indications: The patient is s/p CABG for CHF and multivessel CAD. He has been reintubated twice and has evidence for low EF with end-organ dysfunction. Consented for Impella 5.5 LVAD and tracheostomy.       Surgeon: Wonda Olds   Assistants: Joellyn Rued PA-C  Anesthesia: General endotracheal anesthesia  ASA Class: 4    Procedure Detail  TRACHEOSTOMY placed using Shiley 8DCT Cuffed., Placement Of Impella Left Ventricular Assist Device using ABIOMED Impella 5.5 with SmartAssist Device.  After informed consent, he is taken to the OR at [facility] and placed in the supine position. Anesthesia was confirmed by GET; deep invasive monitoring lines were placed. The anterior neck and chest were cleansed and draped sterilely. A preoperative pause was performed. An incision was made overlying the right deltopectoral groove and dissection was carried down with electrocautery. The pectoralis major and minor muscles were divided. The right axillary artery was encircled and 5,000 units unfractionated heparin was delivered intravenously. A 10 mm Dacron graft was sutured to the opened axillary artery with running 5-0 prolene. The graft was then tunneled inferiorly and used as a conduit for Impella LVAD insertion which was performed under TEE and fluoroscopic guidance. Once the device was in place across the aortic valve, its position was secured and the wound was closed in layers.   Next, attention was  turned to the anterior neck where an incision was made one fingers-breadth above the sternal notch. Dissection was carried down through the platysma muscle and the strap muscles were retracted laterally on each side to expose the anterior portion of the trachea. A tracheotomy was performed through the 3rd tracheal interspace. A #8 Shiley tracheostomy was inserted without difficulty. There was immediate return of CO2 into the ventilator circuit. The appliance was secured at the skin. This concluded the procedure; all sponge, instruments, and needle counts were correct.     Estimated Blood Loss:  less than 50 mL         Drains: none          Blood Given: one unit PrBC         Specimens: none         Implants: none        Complications:  * No complications entered in OR log *         Disposition: ICU - intubated and critically ill.         Condition: stable

## 2019-03-27 NOTE — Brief Op Note (Signed)
03/27/2019  12:44 PM  PATIENT:  Jesse Murphy  86 y.o. male  PRE-OPERATIVE DIAGNOSIS:  RESPIRATORY FAILURE  POST-OPERATIVE DIAGNOSIS:  RESPIRATORY FAILURE  PROCEDURE:  Procedure(s): TRACHEOSTOMY placed using Shiley 8DCT Cuffed. (N/A) Placement Of Impella Left Ventricular Assist Device using ABIOMED Impella 5.5 with SmartAssist Device. (Right)  SURGEON:  Surgeon(s) and Role:    * Wonda Olds, MD - Primary  PHYSICIAN ASSISTANT: Joellyn Rued PA-C  ASSISTANTS: staff   ANESTHESIA:   general  EBL:  150 mL   BLOOD ADMINISTERED:none  DRAINS: none   LOCAL MEDICATIONS USED:  NONE  SPECIMEN:  No Specimen  DISPOSITION OF SPECIMEN:  N/A  COUNTS:  YES  TOURNIQUET:  * No tourniquets in log *  DICTATION: .Note written in EPIC  PLAN OF CARE: Admit to inpatient   PATIENT DISPOSITION:  ICU - intubated and critically ill.   Delay start of Pharmacological VTE agent (>24hrs) due to surgical blood loss or risk of bleeding: yes

## 2019-03-27 NOTE — H&P (Signed)
History and Physical Interval Note:  03/27/2019 9:22 AM  Jesse Murphy  has presented today for surgery, with the diagnosis of Coronary artery disease; atrial fibrillation.  The various methods of treatment have been discussed with the patient and family. After consideration of risks, benefits and other options for treatment, the patient has consented to: Cut Off 5.5 LEFT VENTRICULAR ASSIST DEVICE INSERTION VIA RIGHT AXILLARY ARTERY as a surgical intervention.  The patient's history has been reviewed, patient examined, no change in status, stable for surgery.  I have reviewed the patient's chart and labs.  Questions were answered to the patient's family's satisfaction as the patient, [name], is not presently able to provide informed consent.    Yesica Kemler Z Trexton Escamilla,MD  TCTS

## 2019-03-27 NOTE — Anesthesia Postprocedure Evaluation (Signed)
Anesthesia Post Note  Patient: JOZSEF WESCOAT  Procedure(s) Performed: TRACHEOSTOMY placed using Shiley 8DCT Cuffed. (N/A Neck) Placement Of Impella Left Ventricular Assist Device using ABIOMED Impella 5.5 with SmartAssist Device. (Right Chest)     Patient location during evaluation: SICU Anesthesia Type: General Level of consciousness: sedated Pain management: pain level controlled Vital Signs Assessment: post-procedure vital signs reviewed and stable Respiratory status: patient remains intubated per anesthesia plan Cardiovascular status: stable Postop Assessment: no apparent nausea or vomiting Anesthetic complications: no    Last Vitals:  Vitals:   03/27/19 1445 03/27/19 1500  BP: 131/83 136/85  Pulse: 84 85  Resp: 19 (!) 33  Temp: (!) 35.5 C (!) 35.5 C  SpO2: 97% 98%    Last Pain:  Vitals:   03/27/19 1300  TempSrc: Core  PainSc:                  Cristofher Livecchi DAVID

## 2019-03-27 NOTE — Progress Notes (Addendum)
[address]       [employer] [id number]             [phone]      11 Days Post-Op Procedure(s) (LRB): CORONARY ARTERY BYPASS GRAFTING (CABG), ON PUMP, TIMES THREE, USING LEFT INTERNAL MAMMARY ARTERY, RIGHT GREATER SAPHENOUS VEIN HARVESTED ENDOSCOPICALLY (N/A) MAZE (N/A) CLIPPING OF LEFT  ATRIAL APPENDAGE - USING ATRICLIP SIZE 40 (N/A) TRANSESOPHAGEAL ECHOCARDIOGRAM (TEE) (N/A) Subjective: Plan for trach today Transfused for drop in Hct to 22 Creat/BUN conts to rise  Objective: Vital signs in last 24 hours: Temp:  [97.7 F (36.5 C)-98.6 F (37 C)] 98.1 F (36.7 C) (02/09 0725) Pulse Rate:  [72-125] 103 (02/09 0725) Cardiac Rhythm: Atrial fibrillation (02/09 0030) Resp:  [15-37] 25 (02/09 0725) BP: (97-129)/(21-94) 127/54 (02/09 0725) SpO2:  [92 %-98 %] 95 % (02/09 0720) Arterial Line BP: (65-142)/(40-63) 130/53 (02/09 0710) FiO2 (%):  [40 %-50 %] 40 % (02/09 0720) Weight:  [100.5 kg] 100.5 kg (02/09 0500)  Hemodynamic parameters for last 24 hours: CVP:  [11 mmHg-16 mmHg] 16 mmHg  Intake/Output from previous day: 02/08 0701 - 02/09 0700 In: 2602.2 [I.V.:1252.1; NG/GT:950; IV Piggyback:400.1] Out: 650 [Urine:650] Intake/Output this shift: No intake/output data recorded.  General appearance: alert and no distress Heart: irregularly irregular rhythm and tachy Lungs: dim in lower fields Abdomen: + distension, + BS, soft, nontender Extremities: + edema Wound: incis healing well  Lab Results: Recent Labs    03/26/19 0626 03/26/19 0626 03/26/19 0649 03/27/19 0514  WBC 14.6*  --   --  11.3*  HGB 8.1*   < > 8.8* 6.9*  HCT 26.4*   < > 26.0* 22.0*  PLT 343  --   --  285   < > = values in this interval not displayed.   BMET:  Recent Labs    03/26/19 1437 03/27/19 0514  NA 136 132*  K 4.1 3.6  CL 91* 91*  CO2 25 23  GLUCOSE 196* 385*  BUN 149* 154*  CREATININE 3.41* 3.71*  CALCIUM 7.8* 7.3*    PT/INR: No results for input(s):  LABPROT, INR in the last 72 hours. ABG    Component Value Date/Time   PHART 7.338 (L) 03/26/2019 0649   HCO3 31.7 (H) 03/26/2019 0649   TCO2 33 (H) 03/26/2019 0649   ACIDBASEDEF 1.0 03/20/2019 1745   O2SAT 59.3 03/27/2019 0750   CBG (last 3)  Recent Labs    03/26/19 2150 03/26/19 2353 03/27/19 0401  GLUCAP 181* 187* 110*    Meds Scheduled Meds: . sodium chloride   Intravenous Once  . aspirin  81 mg Per Tube Daily  . atorvastatin  80 mg Per Tube q1800  . bethanechol  5 mg Per Tube TID  . bisacodyl  10 mg Oral Daily   Or  . bisacodyl  10 mg Rectal Daily  . chlorhexidine gluconate (MEDLINE KIT)  15 mL Mouth Rinse BID  . Chlorhexidine Gluconate Cloth  6 each Topical Daily  . docusate  200 mg Per Tube Daily  . feeding supplement (PRO-STAT SUGAR FREE 64)  30 mL Per Tube TID  . furosemide  20 mg Intravenous Once  . insulin aspart  0-24 Units Subcutaneous Q4H  . insulin glargine  18 Units Subcutaneous BID  . levalbuterol  0.63 mg Nebulization Q6H  . lidocaine  5 mL Intradermal Once  . mouth rinse  15 mL Mouth Rinse 10 times per day  . metoprolol tartrate  12.5 mg Per Tube BID  . pantoprazole (PROTONIX) IV  40 mg Intravenous Q24H  . psyllium  1 packet Per Tube BID  . sodium chloride flush  10-40 mL Intracatheter Q12H   Continuous Infusions: . sodium chloride    . sodium chloride    . sodium chloride 10 mL/hr at 03/26/19 1153  . amiodarone 30 mg/hr (03/27/19 0628)  . ceFEPime (MAXIPIME) IV Stopped (03/26/19 1224)  . dexmedetomidine (PRECEDEX) IV infusion 0.4 mcg/kg/hr (03/27/19 0600)  . epinephrine 2 mcg/min (03/27/19 7614)  . feeding supplement (VITAL AF 1.2 CAL) 50 mL/hr at 03/26/19 0800  . norepinephrine (LEVOPHED) Adult infusion 2 mcg/min (03/27/19 0600)   PRN Meds:.sodium chloride, dextrose, docusate, fentaNYL (SUBLIMAZE) injection, fentaNYL (SUBLIMAZE) injection, midazolam, ondansetron (ZOFRAN) IV, oxyCODONE, sodium chloride flush, sodium chloride flush  Xrays DG  Chest Port 1 View  Result Date: 03/26/2019 CLINICAL DATA:  86 year old male postoperative day 10 status post CABG, left atrium MAZE procedure. EXAM: PORTABLE CHEST 1 VIEW COMPARISON:  Portable chest 03/25/2019 and earlier. FINDINGS: Portable AP supine view at 0559 hours. Intubated. Endotracheal tube tip in good position just below the clavicles. Stable visible enteric tube, tip not included. Stable right PICC line. Stable cardiomegaly and mediastinal contours. Stable pulmonary vascularity, no overt edema suspected. Veiling left greater than right lung base and dense retrocardiac opacity appears stable. No pneumothorax. There is a minimally displaced anterior left 2nd rib fracture which is stable in retrospect. IMPRESSION: 1. Intubated with ET tube tip in good position. Other lines and tubes appear stable. 2. Stable ventilation with evidence of bilateral pleural effusions and lower lobe collapse or consolidation. 3. Minimally displaced anterior left 2nd rib fracture, stable across this recent series of exams. Electronically Signed   By: Genevie Ann M.D.   On: 03/26/2019 06:31    Assessment/Plan: S/P Procedure(s) (LRB): CORONARY ARTERY BYPASS GRAFTING (CABG), ON PUMP, TIMES THREE, USING LEFT INTERNAL MAMMARY ARTERY, RIGHT GREATER SAPHENOUS VEIN HARVESTED ENDOSCOPICALLY (N/A) MAZE (N/A) CLIPPING OF LEFT  ATRIAL APPENDAGE - USING ATRICLIP SIZE 40 (N/A) TRANSESOPHAGEAL ECHOCARDIOGRAM (TEE) (N/A)  1 Remains critically ill 2 Poss trach today if medically stable 3 renal function worse - may need nephrology to see 4 transfuse this am, guiac stools 5 checking AXR 6 Co-Ox 59 7 leukocytosis trend improving 8 remains in afib with some RVR 9 BS controlled   LOS: 13 days    Jesse Murphy 03/27/2019 Pt examined and chart reviewed. Case discussed with Dr. Lynetta Mare and Dr. Prescott Gum. Suggest Impella 5.5 LVAD insertion in addition to tracheostomy. Discussed with the patient's son, Letitia Libra, who agrees.  Camay Pedigo Z.  Orvan Seen, [place]

## 2019-03-27 NOTE — Progress Notes (Signed)
TCTS made aware patient converted to A Fib, rate controlled, BP stable.

## 2019-03-27 NOTE — Progress Notes (Signed)
Initial Nutrition Assessment  DOCUMENTATION CODES:   Not applicable  INTERVENTION:   Recommend exchanging NGT for [name]. Next service date Friday from 8 am-4pm.   Tube feeding:  -Vital AF 1.2 @ 45 ml/hr via NGT -60 ml Prostat BID  Provides: 1696 kcals, 141 grams protein, 876 ml free water.   NUTRITION DIAGNOSIS:   Increased nutrient needs related to post-op healing as evidenced by estimated needs.  GOAL:   Patient will meet greater than or equal to 90% of their needs  MONITOR:   Weight trends, Diet advancement, Vent status, Skin, TF tolerance, Labs, I & O's  REASON FOR ASSESSMENT:   Consult Enteral/tube feeding initiation and management  ASSESSMENT:   Patient with PMH significant for CAD s/p PCI, prostate cancer, HTN, CHF, and DM. Presents this admission with chronic a.fib with RVR.   1/28- s/p L heart cath  1/30- s/p CABG x3, MAZE, extubated  2/2- re-intubated  2/5- extubated 2/8- re-intubated   Pt discussed during ICU rounds and with RN.   Pt in surgery at time of RD visit. Had trach and impella placed this this afternoon. Remains volume overloaded. On lasix, Cr up. Continues on pressors. [name] out. NGT placed today at bedside. Will likely need another [name] on Friday.   Admission weight: 89.3 kg  Current weight: 100.5 kg (up 5 kg over last week)  Patient requiring ventilator support via trach MV: 9.3 L/min Temp (24hrs), Avg:96.8 F (36 C), Min:95.9 F (35.5 C), Max:98.5 F (36.9 C)   I/O: +5,357 ml since admit  UOP: 650 ml x 24 hrs  Drips: 1/2NS @ 50 ml/hr, precedex, epinephrine, 250 mg lasix in D5 @ 20 ml/hr Medications: dulcolax, colace, SS novolog, lantus, metamucuil Labs: Na 134 (L) Cr 4.19-trending up LFTs up   Diet Order:   Diet Order    None      EDUCATION NEEDS:   Not appropriate for education at this time  Skin:  Skin Assessment: Reviewed RN Assessment Skin Integrity Issues:: Incisions Incisions: chest, R leg  Last BM:   1/26  Height:   Ht Readings from Last 1 Encounters:  03/26/19 5\' 8"  (1.727 m)    Weight:   Wt Readings from Last 1 Encounters:  03/27/19 100.5 kg    Ideal Body Weight:  70 kg  BMI:  Body mass index is 33.69 kg/m.  Estimated Nutritional Needs:   Kcal:  1555 kcal  Protein:  130-145 grams  Fluid:  >/= 1.5  L/day   Mariana Single RD, LDN Clinical Nutrition Pager # - [phone]

## 2019-03-27 NOTE — Progress Notes (Signed)
ANTICOAGULATION CONSULT NOTE - Initial Consult  Pharmacy Consult for anticoagulation management/assistance  Indication: Impella 5.5  No Known Allergies  Patient Measurements: Height: 5\' 8"  (172.7 cm) Weight: 221 lb 9 oz (100.5 kg) IBW/kg (Calculated) : 68.4 Heparin Dosing Weight: 86kg  Vital Signs: Temp: 95.9 F (35.5 C) (02/09 1530) Temp Source: Core (02/09 1300) BP: 114/87 (02/09 1530) Pulse Rate: 84 (02/09 1530)  Labs: Recent Labs    03/26/19 0626 03/26/19 0649 03/26/19 1437 03/27/19 0514 03/27/19 0514 03/27/19 1043 03/27/19 1255  HGB 8.1*   < >  --  6.9*   < > 8.5* 9.6*  HCT 26.4*   < >  --  22.0*  --  25.0* 30.1*  PLT 343  --   --  285  --   --  298  LABPROT  --   --   --   --   --   --  19.5*  INR  --   --   --   --   --   --  1.7*  CREATININE 3.20*   < > 3.41* 3.71*  --   --  4.19*   < > = values in this interval not displayed.    Estimated Creatinine Clearance: 14 mL/min (A) (by C-G formula based on SCr of 4.19 mg/dL (H)).   Medical History: Past Medical History:  Diagnosis Date  . Atrial fibrillation ([place])   . CAD (coronary artery disease)    a. s/p PCI in 1992 b. Coronary CT in 01/2018 showing extensive coronary calcification; FFR indeterminate --> medical management pursued at that time as patient not interested in repeat cath  . Cancer [facility])    prostate  . Dyspnea   . Hypertension     Assessment: 86 year old male s/p CABG 1/29 with MAZE and LAA. Patient taken to OR this am from tracheostomy and impella 5.5 placement.   Hemoglobin 6.9 this am, RBC given earlier and now hgb is 9.6. Plt wnl.   Ongoing discussion with surgery regarding heparin concentration in purge. Current concentration is 12,500 units/566ml, will change to our standard 1L bag of 25 units/ml.  Goal of Therapy:  Monitor platelets by anticoagulation protocol: Yes   Plan:  Continue heparin purge for now, no plans to check ACTs or add systemic heparin at this time.  Will  continue to follow along with surgery  Erin Hearing PharmD., BCPS Clinical Pharmacist 03/27/2019 3:46 PM

## 2019-03-28 ENCOUNTER — Inpatient Hospital Stay (HOSPITAL_COMMUNITY): Payer: Medicare Other

## 2019-03-28 ENCOUNTER — Encounter: Payer: Self-pay | Admitting: *Deleted

## 2019-03-28 LAB — CULTURE, RESPIRATORY W GRAM STAIN

## 2019-03-28 LAB — RENAL FUNCTION PANEL
Albumin: 2.2 g/dL — ABNORMAL LOW (ref 3.5–5.0)
Anion gap: 20 — ABNORMAL HIGH (ref 5–15)
BUN: 177 mg/dL — ABNORMAL HIGH (ref 8–23)
CO2: 20 mmol/L — ABNORMAL LOW (ref 22–32)
Calcium: 7.5 mg/dL — ABNORMAL LOW (ref 8.9–10.3)
Chloride: 91 mmol/L — ABNORMAL LOW (ref 98–111)
Creatinine, Ser: 4.61 mg/dL — ABNORMAL HIGH (ref 0.61–1.24)
GFR calc Af Amer: 12 mL/min — ABNORMAL LOW (ref >60)
GFR calc non Af Amer: 11 mL/min — ABNORMAL LOW (ref >60)
Glucose, Bld: 180 mg/dL — ABNORMAL HIGH (ref 70–99)
Phosphorus: 8.1 mg/dL — ABNORMAL HIGH (ref 2.5–4.6)
Potassium: 3.7 mmol/L (ref 3.5–5.1)
Sodium: 131 mmol/L — ABNORMAL LOW (ref 135–145)

## 2019-03-28 LAB — COMPREHENSIVE METABOLIC PANEL
ALT: 63 U/L — ABNORMAL HIGH (ref 0–44)
AST: 73 U/L — ABNORMAL HIGH (ref 15–41)
Albumin: 2.4 g/dL — ABNORMAL LOW (ref 3.5–5.0)
Alkaline Phosphatase: 67 U/L (ref 38–126)
Anion gap: 19 — ABNORMAL HIGH (ref 5–15)
BUN: 168 mg/dL — ABNORMAL HIGH (ref 8–23)
CO2: 21 mmol/L — ABNORMAL LOW (ref 22–32)
Calcium: 7.6 mg/dL — ABNORMAL LOW (ref 8.9–10.3)
Chloride: 93 mmol/L — ABNORMAL LOW (ref 98–111)
Creatinine, Ser: 4.43 mg/dL — ABNORMAL HIGH (ref 0.61–1.24)
GFR calc Af Amer: 13 mL/min — ABNORMAL LOW (ref >60)
GFR calc non Af Amer: 11 mL/min — ABNORMAL LOW (ref >60)
Glucose, Bld: 124 mg/dL — ABNORMAL HIGH (ref 70–99)
Potassium: 3.8 mmol/L (ref 3.5–5.1)
Sodium: 133 mmol/L — ABNORMAL LOW (ref 135–145)
Total Bilirubin: 1 mg/dL (ref 0.3–1.2)
Total Protein: 5.6 g/dL — ABNORMAL LOW (ref 6.5–8.1)

## 2019-03-28 LAB — GLUCOSE, CAPILLARY
Glucose-Capillary: 122 mg/dL — ABNORMAL HIGH (ref 70–99)
Glucose-Capillary: 125 mg/dL — ABNORMAL HIGH (ref 70–99)
Glucose-Capillary: 188 mg/dL — ABNORMAL HIGH (ref 70–99)
Glucose-Capillary: 173 mg/dL — ABNORMAL HIGH (ref 70–99)
Glucose-Capillary: 172 mg/dL — ABNORMAL HIGH (ref 70–99)

## 2019-03-28 LAB — POCT I-STAT 7, (LYTES, BLD GAS, ICA,H+H)
Acid-base deficit: 1 mmol/L (ref 0.0–2.0)
Bicarbonate: 22.2 mmol/L (ref 20.0–28.0)
Calcium, Ion: 1 mmol/L — ABNORMAL LOW (ref 1.15–1.40)
HCT: 31 % — ABNORMAL LOW (ref 39.0–52.0)
Hemoglobin: 10.5 g/dL — ABNORMAL LOW (ref 13.0–17.0)
O2 Saturation: 94 %
Patient temperature: 37.2
Potassium: 3.8 mmol/L (ref 3.5–5.1)
Sodium: 132 mmol/L — ABNORMAL LOW (ref 135–145)
TCO2: 23 mmol/L (ref 22–32)
pCO2 arterial: 33.2 mmHg (ref 32.0–48.0)
pH, Arterial: 7.435 (ref 7.350–7.450)
pO2, Arterial: 70 mmHg — ABNORMAL LOW (ref 83.0–108.0)

## 2019-03-28 LAB — CBC
HCT: 29.5 % — ABNORMAL LOW (ref 39.0–52.0)
Hemoglobin: 9.7 g/dL — ABNORMAL LOW (ref 13.0–17.0)
MCH: 28.4 pg (ref 26.0–34.0)
MCHC: 32.9 g/dL (ref 30.0–36.0)
MCV: 86.3 fL (ref 80.0–100.0)
Platelets: 285 10*3/uL (ref 150–400)
RBC: 3.42 MIL/uL — ABNORMAL LOW (ref 4.22–5.81)
RDW: 15.8 % — ABNORMAL HIGH (ref 11.5–15.5)
WBC: 21 10*3/uL — ABNORMAL HIGH (ref 4.0–10.5)
nRBC: 0 % (ref 0.0–0.2)

## 2019-03-28 LAB — APTT
aPTT: 43 seconds — ABNORMAL HIGH (ref 24–36)
aPTT: 59 seconds — ABNORMAL HIGH (ref 24–36)

## 2019-03-28 LAB — COOXEMETRY PANEL
Carboxyhemoglobin: 1.2 % (ref 0.5–1.5)
Methemoglobin: 0.6 % (ref 0.0–1.5)
O2 Saturation: 78.1 %
Total hemoglobin: 9.6 g/dL — ABNORMAL LOW (ref 12.0–16.0)

## 2019-03-28 LAB — HEPARIN LEVEL (UNFRACTIONATED): Heparin Unfractionated: <0.1 IU/mL — ABNORMAL LOW (ref 0.30–0.70)

## 2019-03-28 LAB — LACTATE DEHYDROGENASE: LDH: 304 U/L — ABNORMAL HIGH (ref 98–192)

## 2019-03-28 MED ORDER — AMIODARONE IV BOLUS ONLY 150 MG/100ML
150.0000 mg | Freq: Once | INTRAVENOUS | Status: AC
  Administered 2019-03-28: 150 mg via INTRAVENOUS

## 2019-03-28 MED ORDER — SODIUM CHLORIDE 0.9 % IV SOLN: 2.0000 g | INTRAVENOUS | Status: DC

## 2019-03-28 MED ORDER — SODIUM CHLORIDE 0.9 % IV SOLN
1.0000 g | INTRAVENOUS | Status: DC
  Filled 2019-03-28: qty 1

## 2019-03-28 MED ORDER — VITAL HIGH PROTEIN PO LIQD: 1000.0000 mL | ORAL | Status: DC

## 2019-03-28 MED ORDER — METOLAZONE 5 MG PO TABS
5.0000 mg | ORAL_TABLET | Freq: Every day | ORAL | Status: DC
  Administered 2019-03-28: 5 mg via ORAL
  Filled 2019-03-28: qty 1

## 2019-03-28 MED ORDER — HEPARIN (PORCINE) 25000 UT/250ML-% IV SOLN
400.0000 [IU]/h | INTRAVENOUS | Status: DC
  Administered 2019-03-28: 400 [IU]/h via INTRAVENOUS
  Administered 2019-03-30: 750 [IU]/h via INTRAVENOUS
  Administered 2019-03-31: 700 [IU]/h via INTRAVENOUS
  Filled 2019-03-28 (×3): qty 250

## 2019-03-28 MED ORDER — SODIUM CHLORIDE 0.9 % IV SOLN
2.0000 g | INTRAVENOUS | Status: DC
  Administered 2019-03-28 – 2019-03-29 (×2): 2 g via INTRAVENOUS
  Filled 2019-03-28 (×2): qty 2

## 2019-03-28 MED ORDER — HEPARIN SODIUM (PORCINE) 5000 UNIT/ML IJ SOLN: 5000.0000 [IU] | Freq: Three times a day (TID) | INTRAMUSCULAR | Status: DC

## 2019-03-28 MED ORDER — VITAL AF 1.2 CAL PO LIQD
1000.0000 mL | ORAL | Status: DC
  Administered 2019-03-28: 13:00:00 1000 mL

## 2019-03-28 NOTE — Progress Notes (Signed)
   Tracheostomy and hemodynamic support with right axillary 5.5 Impella.  Advanced heart failure team, Dr. Haroldine Laws now consulting.  Will defer and be available if we can help in any way.

## 2019-03-28 NOTE — Progress Notes (Signed)
TCTS Evening rounds  Hemodynamically stable Impella performing well Making generous amounts of urine. F/u labs/cxr in am  Johana Hopkinson Z. Orvan Seen, [place]

## 2019-03-28 NOTE — Progress Notes (Signed)
Physical Therapy Treatment Patient Details Name: Jesse Murphy MRN: [id number] DOB: [birth year] Today's Date: 03/28/2019    History of Present Illness 86 y.o. male with PMH: h/o CAD s/p CABG and clipping of left atrial appendage, who presented with SOB on 2/1. He became hypoxic on 2/2 with pulmonary edema and needed to be intubated on 2/2 and extubated 2/5. Pt underwent impella placement and tracheostomy on 2/9.    PT Comments    PT performing re-evaluation as pt underwent impella placement and tracheostomy on 03/27/2019. Pt requiring maxA of 2 to long sit in bed, refusing sitting edge of bed or further attempts at functional mobility. Pt with increased minute ventilation and some desaturation to 90% with long sitting for ~20-30 seconds. Pt performs low level LE exercise in bed well, with minimal change in vital signs. Pt will benefit from continued acute PT POC to improve activity tolerance and reduce caregiver burden. Pt with much reduced activity tolerance, PT now recommending SNF at time of discharge.   Follow Up Recommendations  SNF;Supervision/Assistance - 24 hour     Equipment Recommendations  (defer to post-acute setting)    Recommendations for Other Services       Precautions / Restrictions Precautions Precautions: Fall;Sternal;Other (comment) Precaution Comments: educated pt on sternal and impella precautions. Multi lines: rectal tube, coretrak, a line Restrictions Weight Bearing Restrictions: Yes RUE Weight Bearing: Non weight bearing(impella) Other Position/Activity Restrictions: sternal, RUE impella    Mobility  Bed Mobility Overal bed mobility: Needs Assistance Bed Mobility: Supine to Sit     Supine to sit: Max assist;+2 for physical assistance;HOB elevated(supine to long sitting in bed)        Transfers                    Ambulation/Gait                 Stairs             Wheelchair Mobility    Modified Rankin (Stroke Patients  Only)       Balance Overall balance assessment: Needs assistance Sitting-balance support: Feet supported;No upper extremity supported Sitting balance-Leahy Scale: Zero(maxA of 2 in long sitting)   Postural control: Posterior lean                                  Cognition Arousal/Alertness: Awake/alert Behavior During Therapy: Flat affect Overall Cognitive Status: Difficult to assess                                        Exercises General Exercises - Lower Extremity Ankle Circles/Pumps: AROM;Both;10 reps Quad Sets: AROM;Both;5 reps Gluteal Sets: AROM;Both;5 reps Other Exercises Other Exercises: hip IR/ER AROM, 5 reps    General Comments General comments (skin integrity, edema, etc.): pt on trach to vent, full support. Tachu into 110s with activity, saturating 90 and above with mobility and exercise      Pertinent Vitals/Pain Pain Assessment: No/denies pain    Home Living                      Prior Function            PT Goals (current goals can now be found in the care plan section) Acute Rehab PT Goals Patient Stated Goal: home PT Goal  Formulation: With patient Time For Goal Achievement: 04/11/19 Potential to Achieve Goals: Fair Progress towards PT goals: Goals downgraded-see care plan    Frequency    Min 2X/week      PT Plan Discharge plan needs to be updated    Co-evaluation              AM-PAC PT "6 Clicks" Mobility   Outcome Measure  Help needed turning from your back to your side while in a flat bed without using bedrails?: Total Help needed moving from lying on your back to sitting on the side of a flat bed without using bedrails?: Total Help needed moving to and from a bed to a chair (including a wheelchair)?: Total Help needed standing up from a chair using your arms (e.g., wheelchair or bedside chair)?: Total Help needed to walk in hospital room?: Total Help needed climbing 3-5 steps with a  railing? : Total 6 Click Score: 6    End of Session Equipment Utilized During Treatment: Oxygen Activity Tolerance: Patient limited by fatigue Patient left: in bed;with call bell/phone within reach;with bed alarm set;with nursing/sitter in room Nurse Communication: Mobility status PT Visit Diagnosis: Other abnormalities of gait and mobility (R26.89);Muscle weakness (generalized) (M62.81)     Time: 7943-2761 PT Time Calculation (min) (ACUTE ONLY): 18 min  Charges:                        Zenaida Niece, PT, DPT Acute Rehabilitation Pager: [phone]    Zenaida Niece 03/28/2019, 10:50 AM

## 2019-03-28 NOTE — Progress Notes (Addendum)
Daily Progress Note  Patient Details:    Jesse Murphy is an 86 y.o. male with a h/o CAD, afib, s/p CABG and clipping of left atrial appendage on 1/29 and transferred to CVICU with shortness of breath on 2/1.  Patient became hypoxic 2/2 pulm edema and needed to be intubated on 2/2. Following diuresis, patient was extubated 2/5.  Reintubated again on 2/8 due to respiratory failure.    PMHx: CAD, afib  Significant Hospital Events:  1/29 CABG + clipping left atrial appendage 2/1 transfer to CVICU 2/2 SOB 2/2 intubated 2/5 extubated 2/8 intubated 2/9 Impella placed  Significant Diagnostic Tests:  2/8 CXR shows bilateral pleural effusions and lower lobe collapse or consolidation; minimally displaced left anterior 2nd rib fracture stable across recent CXRs 2/7 CXR shows stable ventilation w/ suspected interstitial edema, bilateral pleural effusions and lung base atelectasis 2/5 CXR shows Persistent densities at the left lung base are suggestive for consolidation and probable small left pleural effusion. Stable mild interstitial edema. 2/4 CXR shows improving lung aeration with improving left effusion and atelectasis. 2/3 CXR shows mild improvement in pulm effusion, edema 2/2 CXR shows pulm edema 2/1 CXR shows likely pulm edema 1/29 Intraoperative Echo, EF of 45% 1/26 Preop Echo, EF of 30-35%  Micro Data: 2/8 sputum sent 2/1 sputum sent, culture consistent w/ normal resp flora 1/26 MRSA PCR screening negative 1/26 Covid negative  Antimicrobials: Ceftriaxone 2/1 - 2/7  Interim Hx/ Subjective: O/N Called for severe hypoxemic resp failure - was re-intubated.  Lines, Airways, Drains: Drain  Urethral Catheter Cyd Silence, RN 16 Fr. 5 days  Rectal Tube/Pouch 3 days  Wound  Incision (Closed) 03/16/19 Chest Other (Comment) 8 days  Incision (Closed) 03/16/19 Leg Right 8 days  Pressure Injury 03/24/19 Vertebral column Posterior;Mid;Upper Stage 1 - Intact skin with non-blanchable  redness of a localized area usually over a bony prominence. 2x2 stage 1 pressure injury (reddened) to vertebral column area. foam applied 1 day  PICC Line  PICC Triple Lumen 54/27/06 PICC Right Basilic 43 cm 0 cm 4 days  ART Line  Arterial Line 03/21/19 Left Radial 4 days  NG/OG Tube  Nasoenteric Feeding Tube [name] - 43 inches 10 Fr. Left nare Documented cm marking at nare/ corner of mouth 98 cm      Anti-infectives:  Anti-infectives (From admission, onward)   Start     Dose/Rate Route Frequency Ordered Stop   03/28/19 1100  cefTAZidime (FORTAZ) 1 g in sodium chloride 0.9 % 100 mL IVPB  Status:  Discontinued     1 g 200 mL/hr over 30 Minutes Intravenous Every 24 hours 03/28/19 0950 03/28/19 0950   03/28/19 1100  cefTAZidime (FORTAZ) 2 g in sodium chloride 0.9 % 100 mL IVPB  Status:  Discontinued     2 g 200 mL/hr over 30 Minutes Intravenous Every 24 hours 03/28/19 0950 03/28/19 0951   03/28/19 1100  cefTAZidime (FORTAZ) 2 g in sodium chloride 0.9 % 100 mL IVPB     2 g 200 mL/hr over 30 Minutes Intravenous Every 24 hours 03/28/19 0952     03/26/19 1200  ceFEPIme (MAXIPIME) 2 g in sodium chloride 0.9 % 100 mL IVPB  Status:  Discontinued     2 g 200 mL/hr over 30 Minutes Intravenous Daily 03/26/19 1104 03/28/19 0950   03/19/19 1000  cefTRIAXone (ROCEPHIN) 1 g in sodium chloride 0.9 % 100 mL IVPB     1 g 200 mL/hr over 30 Minutes Intravenous Daily 03/19/19 0810  03/25/19 1117   03/16/19 2230  vancomycin (VANCOCIN) IVPB 1000 mg/200 mL premix     1,000 mg 200 mL/hr over 60 Minutes Intravenous  Once 03/16/19 1721 03/17/19 0024   03/16/19 1830  cefUROXime (ZINACEF) 1.5 g in sodium chloride 0.9 % 100 mL IVPB     1.5 g 200 mL/hr over 30 Minutes Intravenous Every 12 hours 03/16/19 1721 03/18/19 0557   03/16/19 0400  vancomycin (VANCOREADY) IVPB 1500 mg/300 mL  Status:  Discontinued     1,500 mg 150 mL/hr over 120 Minutes Intravenous To Surgery 03/15/19 1425 03/16/19 1715   03/16/19  0400  cefUROXime (ZINACEF) 1.5 g in sodium chloride 0.9 % 100 mL IVPB  Status:  Discontinued     1.5 g 200 mL/hr over 30 Minutes Intravenous To Surgery 03/15/19 1425 03/16/19 1715   03/16/19 0400  cefUROXime (ZINACEF) 750 mg in sodium chloride 0.9 % 100 mL IVPB     750 mg 200 mL/hr over 30 Minutes Intravenous To Surgery 03/15/19 1425 03/16/19 1540      Microbiology: Results for orders placed or performed during the hospital encounter of 03/13/19  Respiratory Panel by RT PCR (Flu A&B, Covid) - Nasopharyngeal Swab     Status: None   Collection Time: 03/13/19  1:49 PM   Specimen: Nasopharyngeal Swab  Result Value Ref Range Status   SARS Coronavirus 2 by RT PCR NEGATIVE NEGATIVE Final    Comment: (NOTE) SARS-CoV-2 target nucleic acids are NOT DETECTED. The SARS-CoV-2 RNA is generally detectable in upper respiratoy specimens during the acute phase of infection. The lowest concentration of SARS-CoV-2 viral copies this assay can detect is 131 copies/mL. A negative result does not preclude SARS-Cov-2 infection and should not be used as the sole basis for treatment or other patient management decisions. A negative result may occur with  improper specimen collection/handling, submission of specimen other than nasopharyngeal swab, presence of viral mutation(s) within the areas targeted by this assay, and inadequate number of viral copies (<131 copies/mL). A negative result must be combined with clinical observations, patient history, and epidemiological information. The expected result is Negative. Fact Sheet for Patients:  PinkCheek.be Fact Sheet for Healthcare Providers:  GravelBags.it This test is not yet ap proved or cleared by the [place] FDA and  has been authorized for detection and/or diagnosis of SARS-CoV-2 by FDA under an Emergency Use Authorization (EUA). This EUA will remain  in effect (meaning this test can be used)  for the duration of the COVID-19 declaration under Section 564(b)(1) of the Act, 21 U.S.C. section 360bbb-3(b)(1), unless the authorization is terminated or revoked sooner.    Influenza A by PCR NEGATIVE NEGATIVE Final   Influenza B by PCR NEGATIVE NEGATIVE Final    Comment: (NOTE) The Xpert Xpress SARS-CoV-2/FLU/RSV assay is intended as an aid in  the diagnosis of influenza from Nasopharyngeal swab specimens and  should not be used as a sole basis for treatment. Nasal washings and  aspirates are unacceptable for Xpert Xpress SARS-CoV-2/FLU/RSV  testing. Fact Sheet for Patients: PinkCheek.be Fact Sheet for Healthcare Providers: GravelBags.it This test is not yet approved or cleared by the [place] FDA and  has been authorized for detection and/or diagnosis of SARS-CoV-2 by  FDA under an Emergency Use Authorization (EUA). This EUA will remain  in effect (meaning this test can be used) for the duration of the  Covid-19 declaration under Section 564(b)(1) of the Act, 21  U.S.C. section 360bbb-3(b)(1), unless the authorization is  terminated or  revoked. Performed at [facility], [address] [place], [place] [address]   MRSA PCR Screening     Status: None   Collection Time: 03/13/19  5:18 PM   Specimen: Nasal Mucosa; Nasopharyngeal  Result Value Ref Range Status   MRSA by PCR NEGATIVE NEGATIVE Final    Comment:        The GeneXpert MRSA Assay (FDA approved for NASAL specimens only), is one component of a comprehensive MRSA colonization surveillance program. It is not intended to diagnose MRSA infection nor to guide or monitor treatment for MRSA infections. Performed at [facility], [address] [place], [place] [address]   Surgical pcr screen     Status: None   Collection Time: 03/15/19  9:28 PM   Specimen: Nasal Mucosa; Nasal Swab  Result Value Ref Range Status   MRSA, PCR NEGATIVE NEGATIVE Final    Staphylococcus aureus NEGATIVE NEGATIVE Final    Comment: (NOTE) The Xpert SA Assay (FDA approved for NASAL specimens in patients 91 years of age and older), is one component of a comprehensive surveillance program. It is not intended to diagnose infection nor to guide or monitor treatment. Performed at [facility], [place] [address] [place], [place] [address]   Culture, respiratory (non-expectorated)     Status: None   Collection Time: 03/19/19  3:00 PM   Specimen: Tracheal Aspirate; Respiratory  Result Value Ref Range Status   Specimen Description TRACHEAL ASPIRATE  Final   Special Requests Normal  Final   Gram Stain   Final    ABUNDANT WBC PRESENT,BOTH PMN AND MONONUCLEAR FEW GRAM POSITIVE COCCI RARE GRAM NEGATIVE RODS    Culture   Final    FEW Consistent with normal respiratory flora. Performed at [facility], [place] [address] [place], [place] [address]    Report Status 03/21/2019 FINAL  Final    Objective:  Vital signs for last 24 hours: Temp:  [95.9 F (35.5 C)-99.3 F (37.4 C)] 99 F (37.2 C) (02/10 1215) Pulse Rate:  [33-111] 79 (02/10 1215) Resp:  [15-35] 26 (02/10 1215) BP: (95-199)/(60-109) 123/68 (02/10 1215) SpO2:  [90 %-99 %] 96 % (02/10 1215) Arterial Line BP: (73-153)/(57-97) 137/62 (02/10 1215) FiO2 (%):  [40 %-60 %] 40 % (02/10 1156) Weight:  [103.1 kg] 103.1 kg (02/10 0500)  Hemodynamic parameters for last 24 hours: PAP: (38-62)/(17-35) 51/19 CVP:  [7 mmHg-29 mmHg] 13 mmHg PCWP:  [25 mmHg] 25 mmHg CO:  [5.1 L/min-6.8 L/min] 6.8 L/min CI:  [2.4 L/min/m2-3.2 L/min/m2] 3.2 L/min/m2  Intake/Output from previous day: 02/09 0701 - 02/10 0700 In: 3372.4 [I.V.:2451; Blood:315; NG/GT:335] Out: 5732 [Urine:1545; Stool:100; Blood:150]  Intake/Output this shift: Total I/O In: 946.3 [I.V.:507.6; Other:68.6; NG/GT:170; IV Piggyback:200.1] Out: 485 [Urine:485]   Physical Exam:  General: intubated, appears weak Neuro: rouses to voice  initially, then becomes somnolent. Generalized weakness. HEENT/Neck: tracheostomy in place. Resp: clear bilaterally  CVS:  irregularly irregular rhythm and tachy GI: soft, non-tender, distended Skin: no rash, warm & dry Extremities: edema in UEs and LEs - increasing  CXR bilateral effusions and increased interstitial markings.   Assessment/Plan:  Critically ill due to Hypercarbic Hypoxic Respiratory Failure 2/2 cardiogenic pulmonary edema and generalized weakness requiring mechanical ventilation.  Re-intubated 2/8.  Remains total body water overloaded, and is receiving a large amount of fluids.  - continue full ventilatory support.   Remains critically ill due to cardiogenic shock requiring mechanical cardiac support and titration of inotropes and vasopressors secondary to HFrEF.  Hemodynamics have improved with current level of support.   Critically ill due to atrial fibrillation with rapid ventricular response requiring titration of amiodarone, Patient has converted to NSR. Significant improvement in hemodynamics with AV synchrony.  - re-bolus with amiodarone for recurrent atrial fibrillation.  AKI has worsened despite aggressive diuresis. Marked volume overload. no improvement in creatinine with diuretic holiday. Suspect hypoperfusion from poor cardiac function(cardiorenal syndrome) - furosemide infusion and zaroxylyn. - may still require CRRT.   Hemodilutional anemia/ anemia of chronic illness. No signs of bleeding. - transfusion today.  Leukocytosis despite improving hemodynamics. Sputum positive for Pseudomonas. Copious secretions in airway when trach placed.  - on ceftazidime  Daily Goals Checklist  Pain/Anxiety/Delirium protocol (if indicated): Precedex infusion.  Respiratory support goals: Full ventilatory support. Currently on minimal settings. Blood pressure target: Continue norepinephrine for renal perfusion at this time, epinephrine for inotropic support.    prophylaxis: heparin in purge fluid, IV heparin infusion. Nutrition Status: Nutrition Problem: Increased nutrient needs Etiology: post-op healing Signs/Symptoms: estimated needs Interventions: Tube feeding, Prostat restart trickle feeds  GI prophylaxis: PPI Fluid status goals: Furosemide infusion and metolazone. Urinary catheter: Required for retention and edema. Central lines: Continue current central lines until off vasoactive agents Glucose control: We will increase tube feed coverage. Mobility/therapy needs: In chair position in bed. Antibiotic de-escalation:  Seven more days for pseudomonas.  Home medication reconciliation: on hold while intubated. Daily labs: CBC, CMP  Code Status: Full code Family Communication: Updated by cardiac surgery Disposition: ICU until infusions discontinued.  CRITICAL CARE Performed by: Kipp Brood, MD   Total critical care time: 40 minutes  Critical care time was exclusive of separately billable procedures and treating other patients.  Critical care was necessary to treat or prevent imminent or life-threatening deterioration.  Critical care was time spent personally by me on the following activities: development of treatment plan with patient and/or surrogate as well as nursing, discussions with consultants, evaluation of patient's response to treatment, examination of patient, obtaining history from patient or surrogate, ordering and performing treatments and interventions, ordering and review of laboratory studies, ordering and review of radiographic studies, pulse oximetry, re-evaluation of patient's condition and participation in multidisciplinary rounds.  Kipp Brood, MD [facility] ICU Physician [place]  Pager: [phone] Mobile: [phone] After hours: [phone].  03/28/2019, 12:31 PM

## 2019-03-28 NOTE — Progress Notes (Signed)
ANTICOAGULATION CONSULT NOTE   Pharmacy Consult for anticoagulation management/assistance  Indication: Impella 5.5  No Known Allergies  Patient Measurements: Height: 5\' 8"  (172.7 cm) Weight: 227 lb 4.7 oz (103.1 kg) IBW/kg (Calculated) : 68.4 Heparin Dosing Weight: 86kg  Vital Signs: Temp: 99 F (37.2 C) (02/10 1221) Temp Source: Core (02/10 1145) BP: 123/68 (02/10 1215) Pulse Rate: 61 (02/10 1221)  Labs: Recent Labs    03/27/19 0514 03/27/19 1043 03/27/19 1255 03/27/19 1305 03/27/19 1600 03/27/19 1621 03/27/19 1621 03/28/19 0417 03/28/19 0432  HGB 6.9*   < > 9.6*   < >  --  9.9*   < > 9.7* 10.5*  HCT 22.0*   < > 30.1*   < >  --  29.0*  --  29.5* 31.0*  PLT 285  --  298  --   --   --   --  285  --   APTT  --   --   --   --   --   --   --  43*  --   LABPROT  --   --  19.5*  --   --   --   --   --   --   INR  --   --  1.7*  --   --   --   --   --   --   CREATININE 3.71*  --  4.19*  --  4.15*  --   --  4.43*  --    < > = values in this interval not displayed.    Estimated Creatinine Clearance: 13.4 mL/min (A) (by C-G formula based on SCr of 4.43 mg/dL (H)).   Medical History: Past Medical History:  Diagnosis Date  . Atrial fibrillation ([place])   . CAD (coronary artery disease)    a. s/p PCI in 1992 b. Coronary CT in 01/2018 showing extensive coronary calcification; FFR indeterminate --> medical management pursued at that time as patient not interested in repeat cath  . Cancer [facility])    prostate  . Dyspnea   . Hypertension     Assessment: 86 year old male s/p CABG 1/29 with MAZE and LAA. Patient taken to OR 2/9 for tracheostomy and impella 5.5 placement.   Hemoglobin up this am to 10.5, RBC given 2/9. Plt wnl. Nursing noted blood from rectum overnight but also noted possible hemorrhoidal trauma after flexiseal placed.  LDH 304.   Patient currently receiving only heparin purge 25units/ml (1/2 strength) providing about 340 units/hr of heparin. New orders received  to start 400 units/hr of systemic heparin and leave purge concentration at current concentration.   Surgery will continue to assess increasing heparin rate or purge concentration as deemed necessary.   Goal of Therapy:  Monitor platelets by anticoagulation protocol: Yes   Plan:  Continue 1/2 str heparin purge for now, surgery added 400 units Will check heparin level and aptt tonight to rule out elevated levels Will continue to follow along with surgery  Erin Hearing PharmD., BCPS Clinical Pharmacist 03/28/2019 12:33 PM

## 2019-03-28 NOTE — Progress Notes (Addendum)
[facility] DAILY ICU PROGRESS NOTE                   [address]            [place] [address]          [phone]   1 Day Post-Op Procedure(s) (LRB): TRACHEOSTOMY placed using Shiley 8DCT Cuffed. (N/A) Placement Of Impella Left Ventricular Assist Device using ABIOMED Impella 5.5 with SmartAssist Device. (Right)  Total Length of Stay:  LOS: 14 days   Subjective: Remains critical, Impella now in place  objective: Vital signs in last 24 hours: Temp:  [95.9 F (35.5 C)-99.1 F (37.3 C)] 98.8 F (37.1 C) (02/10 0645) Pulse Rate:  [33-111] 101 (02/10 0645) Cardiac Rhythm: Atrial fibrillation (02/09 2000) Resp:  [15-35] 18 (02/10 0645) BP: (95-136)/(54-109) 115/91 (02/10 0645) SpO2:  [90 %-99 %] 96 % (02/10 0645) Arterial Line BP: (73-153)/(61-97) 120/63 (02/10 0645) FiO2 (%):  [40 %-60 %] 40 % (02/10 0340) Weight:  [103.1 kg] 103.1 kg (02/10 0500)  Filed Weights   03/25/19 0342 03/27/19 0500 03/28/19 0500  Weight: 95.1 kg 100.5 kg 103.1 kg    Weight change: 2.6 kg   Hemodynamic parameters for last 24 hours: PAP: (38-62)/(17-35) 40/19 CVP:  [9 mmHg-29 mmHg] 9 mmHg PCWP:  [25 mmHg] 25 mmHg CO:  [5.1 L/min-6.4 L/min] 6.4 L/min CI:  [2.4 L/min/m2-3 L/min/m2] 3 L/min/m2  Intake/Output from previous day: 02/09 0701 - 02/10 0700 In: 3276.8 [I.V.:2369.2; Blood:315; NG/GT:335] Out: 3419 [Urine:1545; Stool:100; Blood:150]  Intake/Output this shift: No intake/output data recorded.  Current Meds: Scheduled Meds: . sodium chloride   Intravenous Once  . aspirin  81 mg Per Tube Daily  . atorvastatin  80 mg Per Tube q1800  . bethanechol  5 mg Per Tube TID  . bisacodyl  10 mg Oral Daily   Or  . bisacodyl  10 mg Rectal Daily  . chlorhexidine gluconate (MEDLINE KIT)  15 mL Mouth Rinse BID  . Chlorhexidine Gluconate Cloth  6 each Topical Daily  . docusate  200 mg Per Tube Daily  . feeding supplement (PRO-STAT SUGAR FREE 64)  60 mL Per Tube BID  . insulin aspart   0-24 Units Subcutaneous Q4H  . insulin glargine  18 Units Subcutaneous Daily  . lidocaine  5 mL Intradermal Once  . mouth rinse  15 mL Mouth Rinse 10 times per day  . pantoprazole (PROTONIX) IV  40 mg Intravenous Q24H  . psyllium  1 packet Per Tube BID  . sodium chloride flush  10-40 mL Intracatheter Q12H   Continuous Infusions: . sodium chloride    . sodium chloride 10 mL/hr at 03/28/19 0600  . sodium chloride 10 mL/hr at 03/26/19 1153  . sodium chloride Stopped (03/27/19 0945)  . amiodarone 30 mg/hr (03/28/19 0648)  . ceFEPime (MAXIPIME) IV Stopped (03/26/19 1224)  . dexmedetomidine (PRECEDEX) IV infusion 0.4 mcg/kg/hr (03/28/19 0600)  . epinephrine 1 mcg/min (03/28/19 0600)  . feeding supplement (VITAL AF 1.2 CAL)    . furosemide (LASIX) infusion 20 mg/hr (03/28/19 0600)  . impella catheter heparin 25 unit/mL in dextrose 5%    . norepinephrine (LEVOPHED) Adult infusion 6 mcg/min (03/28/19 0600)   PRN Meds:.sodium chloride, dextrose, docusate, fentaNYL (SUBLIMAZE) injection, fentaNYL (SUBLIMAZE) injection, midazolam, ondansetron (ZOFRAN) IV, oxyCODONE, sodium chloride flush, sodium chloride flush  General appearance: alert, cooperative, fatigued, no distress and sedated Neurologic: intact Heart: irregularly irregular rhythm and tachy Lungs: dim in lower fields Abdomen: mild disrension,  soft Extremities: + peripheral edema Wound: healing well  Lab Results: CBC: Recent Labs    03/27/19 1255 03/27/19 1305 03/28/19 0417 03/28/19 0432  WBC 15.0*  --  21.0*  --   HGB 9.6*   < > 9.7* 10.5*  HCT 30.1*   < > 29.5* 31.0*  PLT 298  --  285  --    < > = values in this interval not displayed.   BMET:  Recent Labs    03/27/19 1600 03/27/19 1621 03/28/19 0417 03/28/19 0432  NA 134*   < > 133* 132*  K 3.8   < > 3.8 3.8  CL 95*  --  93*  --   CO2 21*  --  21*  --   GLUCOSE 164*  --  124*  --   BUN 166*  --  168*  --   CREATININE 4.15*  --  4.43*  --   CALCIUM 7.4*  --   7.6*  --    < > = values in this interval not displayed.    CMET: Lab Results  Component Value Date   WBC 21.0 (H) 03/28/2019   HGB 10.5 (L) 03/28/2019   HCT 31.0 (L) 03/28/2019   PLT 285 03/28/2019   GLUCOSE 124 (H) 03/28/2019   CHOL 117 03/16/2019   TRIG 52 03/16/2019   HDL 33 (L) 03/16/2019   LDLCALC 74 03/16/2019   ALT 63 (H) 03/28/2019   AST 73 (H) 03/28/2019   NA 132 (L) 03/28/2019   K 3.8 03/28/2019   CL 93 (L) 03/28/2019   CREATININE 4.43 (H) 03/28/2019   BUN 168 (H) 03/28/2019   CO2 21 (L) 03/28/2019   TSH 3.683 03/13/2019   INR 1.7 (H) 03/27/2019   HGBA1C 6.1 (H) 03/13/2019      PT/INR:  Recent Labs    03/27/19 1255  LABPROT 19.5*  INR 1.7*   Radiology: [facility] Chest Port 1 View  Result Date: 03/27/2019 CLINICAL DATA:  Status post tracheostomy placement EXAM: PORTABLE CHEST 1 VIEW COMPARISON:  03/27/2019 FINDINGS: Cardiac shadow is prominent but stable. Impella device is noted in place. Postsurgical changes are again seen and stable. Right-sided PICC line is noted at the cavoatrial junction. New tracheostomy tube is seen. Feeding catheter has been removed in the interval. Diffuse haziness is noted over both lungs likely related to a posteriorly layering effusion bilaterally. Mild vascular congestion is again seen. Swan-Ganz catheter is noted in satisfactory position. IMPRESSION: Mild vascular congestion and small effusions posteriorly. Tubes and lines as described. Electronically Signed   By: Inez Catalina M.D.   On: 03/27/2019 13:16   DG Abd Portable 1V  Result Date: 03/27/2019 CLINICAL DATA:  NG tube placement. EXAM: PORTABLE ABDOMEN - 1 VIEW COMPARISON:  Radiograph earlier this day FINDINGS: The previous weighted enteric tube is been removed. There is a new enteric tube with tip and side-port below the diaphragm in the distal stomach. Swan-Ganz catheter and intra-aortic balloon pump are partially included in the lower chest. Nonobstructive bowel gas pattern.  IMPRESSION: Tip and side port of the enteric tube below the diaphragm in the distal stomach. Electronically Signed   By: Keith Rake M.D.   On: 03/27/2019 13:46   DG Fluoro Guide CV Line-No Report  Result Date: 03/27/2019 Fluoroscopy was utilized by the requesting physician.  No radiographic interpretation.   ECHO INTRAOPERATIVE TEE  Result Date: 03/27/2019  *INTRAOPERATIVE TRANSESOPHAGEAL REPORT *  Patient Name:   DUFF POZZI Date of Exam: 03/27/2019  Medical Rec #:  [id number]    Height:       68.0 in Accession #:    [id number]   Weight:       221.6 lb Date of Birth:  1928-10-21   BSA:          2.13 m Patient Age:    86 years     BP:           127/54 mmHg Patient Gender: M            HR:           103 bpm. Exam Location:  Inpatient Transesophogeal exam was perform intraoperatively during surgical procedure. Patient was closely monitored under general anesthesia during the entirety of examination. Indications:     414.8 cardiomyopathy Performing Phys: [id number] BROADUS Z ATKINS Complications: No known complications during this procedure. POST-OP IMPRESSIONS - Left Ventricle: The left ventricle is unchanged from pre-bypass. - Aortic Valve: Impella in situ in good position. PRE-OP FINDINGS  Left Ventricle: The left ventricle has low normal systolic function, with an ejection fraction of 50-55%. The cavity size was normal. There is mild concentric left ventricular hypertrophy. Right Ventricle: The right ventricle has normal systolic function. The cavity was normal. There is no increase in right ventricular wall thickness. Left Atrium: Left atrial size was normal in size. The left atrial appendage is well visualized and there is no evidence of thrombus present. Right Atrium: Right atrial size was normal in size. Right atrial pressure is estimated at 10 mmHg. Interatrial Septum: No atrial level shunt detected by color flow Doppler. Pericardium: There is no evidence of pericardial effusion. Mitral Valve: The  mitral valve is normal in structure. Mitral valve regurgitation is mild to moderate by color flow Doppler. The MR jet is centrally-directed. Tricuspid Valve: The tricuspid valve was normal in structure. Tricuspid valve regurgitation is mild by color flow Doppler. Aortic Valve: The aortic valve is tricuspid Aortic valve regurgitation was not visualized by color flow Doppler. There is no stenosis of the aortic valve. Pulmonic Valve: The pulmonic valve was not assessed. Pulmonic valve regurgitation is not visualized by color flow Doppler. Aorta: The aortic root, ascending aorta and aortic arch are normal in size and structure.  Lillia Abed MD Electronically signed by Lillia Abed MD Signature Date/Time: 03/27/2019/5:39:33 PM    Final      Assessment/Plan: S/P Procedure(s) (LRB): TRACHEOSTOMY placed using Shiley 8DCT Cuffed. (N/A) Placement Of Impella Left Ventricular Assist Device using ABIOMED Impella 5.5 with SmartAssist Device. (Right)  1 hemodyn stable on P6 with good flows and CO/CI, PAP elevated and CVP 9. Co-Ox is 78. Also on Levo/epi/amio gtts- wean levo /epi as able. Low Ionized Ca++- may need replacement. Making adeq UOP(100cc/hr) but renal fxn conts to deteriorate- may need dialysis/CVVH 2 ABG looks ok with some hypoxemia but good acid- base balance- now trached. CXR shows pulm edema pattern 3 H/H stable - s/p transfusion- nursing reports BRBPR- had flexiseal- now currently out 4 increased leukocytosis, tmax 99.1- monitor closely for evid of infection and will need pan cx if fevers develope 5 TF on hold 6 afib conts to be an issue, rate currently reasonable  Jesse Murphy 03/28/2019 7:10 AM   Patient has sputum cultures positive for Pseudomonas, resistant to cefepime-Fortaz started Hemodynamics improved with Impella catheter-we will start low-dose peripheral heparin in addition to heparin purge because of patient's A. fib and need for long-term Impella support Making urine on Lasix drip-hold  off on CVVH  and follow. Place core track catheter later this week and start nutrition  patient examined and medical record reviewed,agree with above note. Tharon Aquas Trigt III 03/28/2019

## 2019-03-28 NOTE — Progress Notes (Signed)
Nutrition Brief Note  RD consulted to restart trickle feedings. Start Vital AF 1.2 @ 20 ml/hr per NGT. RN made aware. Plan [name] placement on next service date (Friday 2/12 from 8am to 4 pm).   Mariana Single RD, LDN Clinical Nutrition Pager listed in [place]

## 2019-03-28 NOTE — Progress Notes (Addendum)
Advanced Heart Failure Rounding Note  PCP-Cardiologist: Kate Sable, MD   Subjective:    Remains on impella 5.5 support, P-6, Flow 4.0  Co-ox 78%  Back in afib overnight. Rates 90s-110s.   NE increased from 1>>6 mcg. MAP goal ~90 per CT surgery.  On Epi 1 mcg   SCr continues to rise, 0.90-> 3.7->4.2->4.4  Lasix gtt set at 20 mg/hr. Wt up an additional 6 lb, from 221>>227 lb today. -1.5L in UOP yesterday but net I/Os for the day was +1.5L  Swan #s CVP 8-9 PAP 40/19 CO 5.9 CI 2.8    Objective:   Weight Range: 103.1 kg Body mass index is 34.56 kg/m.   Vital Signs:   Temp:  [95.9 F (35.5 C)-99.1 F (37.3 C)] 98.8 F (37.1 C) (02/10 0645) Pulse Rate:  [33-111] 101 (02/10 0645) Resp:  [15-35] 18 (02/10 0645) BP: (95-136)/(54-109) 115/91 (02/10 0645) SpO2:  [90 %-99 %] 96 % (02/10 0645) Arterial Line BP: (73-153)/(61-97) 120/63 (02/10 0645) FiO2 (%):  [40 %-60 %] 40 % (02/10 0340) Weight:  [103.1 kg] 103.1 kg (02/10 0500) Last BM Date: 03/27/19  Weight change: Filed Weights   03/25/19 0342 03/27/19 0500 03/28/19 0500  Weight: 95.1 kg 100.5 kg 103.1 kg    Intake/Output:   Intake/Output Summary (Last 24 hours) at 03/28/2019 0716 Last data filed at 03/28/2019 0600 Gross per 24 hour  Intake 3276.78 ml  Output 1795 ml  Net 1481.78 ml      Physical Exam    General:  [name] on vent through trach, awake  HEENT: Normal + NGT Neck: Supple. Rt IJ Swan, unable to assess JVD. + TC (bloody). Carotids 2+ bilat; no bruits. No lymphadenopathy or thyromegaly appreciated. Cor: PMI nondisplaced. Irregularly irregular rhythm, tachy rate. No rubs, gallops or murmurs. Lungs: clear on vent through Trach  Abdomen: obese, Soft, nontender, nondistended. No hepatosplenomegaly. No bruits or masses. Good bowel sounds. Extremities: No cyanosis, clubbing, rash, 2-3+ bilateral LEE, upper extremities edematous Neuro: Alert & orientedx3, cranial nerves  grossly intact. moves all 4 extremities w/o difficulty. Affect pleasant   Telemetry   afib 110s Personally reviewed   EKG    No new EKG to review   Labs    CBC Recent Labs    03/27/19 1255 03/27/19 1305 03/28/19 0417 03/28/19 0432  WBC 15.0*  --  21.0*  --   HGB 9.6*   < > 9.7* 10.5*  HCT 30.1*   < > 29.5* 31.0*  MCV 89.3  --  86.3  --   PLT 298  --  285  --    < > = values in this interval not displayed.   Basic Metabolic Panel Recent Labs    03/26/19 0626 03/26/19 0649 03/26/19 1437 03/27/19 0514 03/27/19 1600 03/27/19 1621 03/28/19 0417 03/28/19 0432  NA 135   < > 136   < > 134*   < > 133* 132*  K 3.9   < > 4.1   < > 3.8   < > 3.8 3.8  CL 96*   < > 91*   < > 95*  --  93*  --   CO2 25   < > 25   < > 21*  --  21*  --   GLUCOSE 287*   < > 196*   < > 164*  --  124*  --   BUN 135*   < > 149*   < > 166*  --  168*  --   CREATININE 3.20*   < > 3.41*   < > 4.15*  --  4.43*  --   CALCIUM 7.3*   < > 7.8*   < > 7.4*  --  7.6*  --   MG 2.2  --   --   --   --   --   --   --   PHOS 6.8*   < > 6.3*  --  7.9*  --   --   --    < > = values in this interval not displayed.   Liver Function Tests Recent Labs    03/27/19 1255 03/27/19 1255 03/27/19 1600 03/28/19 0417  AST 85*  --   --  73*  ALT 81*  --   --  63*  ALKPHOS 55  --   --  67  BILITOT 0.5  --   --  1.0  PROT 5.8*  --   --  5.6*  ALBUMIN 2.6*   < > 2.6* 2.4*   < > = values in this interval not displayed.   No results for input(s): LIPASE, AMYLASE in the last 72 hours. Cardiac Enzymes No results for input(s): CKTOTAL, CKMB, CKMBINDEX, TROPONINI in the last 72 hours.  BNP: BNP (last 3 results) Recent Labs    03/22/19 0401 03/26/19 0626 03/27/19 1255  BNP 340.4* 687.5* 800.4*    ProBNP (last 3 results) No results for input(s): PROBNP in the last 8760 hours.   D-Dimer No results for input(s): DDIMER in the last 72 hours. Hemoglobin A1C No results for input(s): HGBA1C in the last 72  hours. Fasting Lipid Panel No results for input(s): CHOL, HDL, LDLCALC, TRIG, CHOLHDL, LDLDIRECT in the last 72 hours. Thyroid Function Tests No results for input(s): TSH, T4TOTAL, T3FREE, THYROIDAB in the last 72 hours.  Invalid input(s): FREET3  Other results:   Imaging    DG Chest Port 1 View  Result Date: 03/27/2019 CLINICAL DATA:  Status post tracheostomy placement EXAM: PORTABLE CHEST 1 VIEW COMPARISON:  03/27/2019 FINDINGS: Cardiac shadow is prominent but stable. Impella device is noted in place. Postsurgical changes are again seen and stable. Right-sided PICC line is noted at the cavoatrial junction. New tracheostomy tube is seen. Feeding catheter has been removed in the interval. Diffuse haziness is noted over both lungs likely related to a posteriorly layering effusion bilaterally. Mild vascular congestion is again seen. Swan-Ganz catheter is noted in satisfactory position. IMPRESSION: Mild vascular congestion and small effusions posteriorly. Tubes and lines as described. Electronically Signed   By: Inez Catalina M.D.   On: 03/27/2019 13:16   DG Abd Portable 1V  Result Date: 03/27/2019 CLINICAL DATA:  NG tube placement. EXAM: PORTABLE ABDOMEN - 1 VIEW COMPARISON:  Radiograph earlier this day FINDINGS: The previous weighted enteric tube is been removed. There is a new enteric tube with tip and side-port below the diaphragm in the distal stomach. Swan-Ganz catheter and intra-aortic balloon pump are partially included in the lower chest. Nonobstructive bowel gas pattern. IMPRESSION: Tip and side port of the enteric tube below the diaphragm in the distal stomach. Electronically Signed   By: Keith Rake M.D.   On: 03/27/2019 13:46   DG Fluoro Guide CV Line-No Report  Result Date: 03/27/2019 Fluoroscopy was utilized by the requesting physician.  No radiographic interpretation.   ECHO INTRAOPERATIVE TEE  Result Date: 03/27/2019  *INTRAOPERATIVE TRANSESOPHAGEAL REPORT *  Patient Name:    Jesse Murphy Date of Exam: 03/27/2019  Medical Rec #:  [id number]    Height:       68.0 in Accession #:    [id number]   Weight:       221.6 lb Date of Birth:  1928/02/17   BSA:          2.13 m Patient Age:    86 years     BP:           127/54 mmHg Patient Gender: M            HR:           103 bpm. Exam Location:  Inpatient Transesophogeal exam was perform intraoperatively during surgical procedure. Patient was closely monitored under general anesthesia during the entirety of examination. Indications:     414.8 cardiomyopathy Performing Phys: [id number] BROADUS Z ATKINS Complications: No known complications during this procedure. POST-OP IMPRESSIONS - Left Ventricle: The left ventricle is unchanged from pre-bypass. - Aortic Valve: Impella in situ in good position. PRE-OP FINDINGS  Left Ventricle: The left ventricle has low normal systolic function, with an ejection fraction of 50-55%. The cavity size was normal. There is mild concentric left ventricular hypertrophy. Right Ventricle: The right ventricle has normal systolic function. The cavity was normal. There is no increase in right ventricular wall thickness. Left Atrium: Left atrial size was normal in size. The left atrial appendage is well visualized and there is no evidence of thrombus present. Right Atrium: Right atrial size was normal in size. Right atrial pressure is estimated at 10 mmHg. Interatrial Septum: No atrial level shunt detected by color flow Doppler. Pericardium: There is no evidence of pericardial effusion. Mitral Valve: The mitral valve is normal in structure. Mitral valve regurgitation is mild to moderate by color flow Doppler. The MR jet is centrally-directed. Tricuspid Valve: The tricuspid valve was normal in structure. Tricuspid valve regurgitation is mild by color flow Doppler. Aortic Valve: The aortic valve is tricuspid Aortic valve regurgitation was not visualized by color flow Doppler. There is no stenosis of the aortic valve. Pulmonic Valve:  The pulmonic valve was not assessed. Pulmonic valve regurgitation is not visualized by color flow Doppler. Aorta: The aortic root, ascending aorta and aortic arch are normal in size and structure.  Lillia Abed MD Electronically signed by Lillia Abed MD Signature Date/Time: 03/27/2019/5:39:33 PM    Final       Medications:     Scheduled Medications: . sodium chloride   Intravenous Once  . aspirin  81 mg Per Tube Daily  . atorvastatin  80 mg Per Tube q1800  . bethanechol  5 mg Per Tube TID  . bisacodyl  10 mg Oral Daily   Or  . bisacodyl  10 mg Rectal Daily  . chlorhexidine gluconate (MEDLINE KIT)  15 mL Mouth Rinse BID  . Chlorhexidine Gluconate Cloth  6 each Topical Daily  . docusate  200 mg Per Tube Daily  . feeding supplement (PRO-STAT SUGAR FREE 64)  60 mL Per Tube BID  . insulin aspart  0-24 Units Subcutaneous Q4H  . insulin glargine  18 Units Subcutaneous Daily  . lidocaine  5 mL Intradermal Once  . mouth rinse  15 mL Mouth Rinse 10 times per day  . pantoprazole (PROTONIX) IV  40 mg Intravenous Q24H  . psyllium  1 packet Per Tube BID  . sodium chloride flush  10-40 mL Intracatheter Q12H     Infusions: . sodium chloride    . sodium chloride 10 mL/hr at 03/28/19 0600  .  sodium chloride 10 mL/hr at 03/26/19 1153  . sodium chloride Stopped (03/27/19 0945)  . amiodarone 30 mg/hr (03/28/19 0648)  . ceFEPime (MAXIPIME) IV Stopped (03/26/19 1224)  . dexmedetomidine (PRECEDEX) IV infusion 0.4 mcg/kg/hr (03/28/19 0600)  . epinephrine 1 mcg/min (03/28/19 0600)  . feeding supplement (VITAL AF 1.2 CAL)    . furosemide (LASIX) infusion 20 mg/hr (03/28/19 0600)  . impella catheter heparin 25 unit/mL in dextrose 5%    . norepinephrine (LEVOPHED) Adult infusion 6 mcg/min (03/28/19 0600)     PRN Medications:  sodium chloride, dextrose, docusate, fentaNYL (SUBLIMAZE) injection, fentaNYL (SUBLIMAZE) injection, midazolam, ondansetron (ZOFRAN) IV, oxyCODONE, sodium chloride flush, sodium  chloride flush    Assessment/Plan    1. Acute systolic HF -> Cardiogenic shock - post-op echo on 03/20/19 EF 25-30% (pre-op 30-35%. In 12/2017 EF normal) - Outputs now normal with Impella support. Co-ox 78%. Can start milrinone as needed - Markedly volume overloaded. On lasix gtt -1.5L in UOP yesterday. Increase drip as tolerated. May need CVVHD - No b,block, ACE/ARB, spiro or dig with shock and renal failure currently  2. CAD s/p CABG this admit (LIMA->LAD, SVG->OM, SVG->RCA on 03/15/19) - No s/s angina - Continue ASA/statin  3. Acute hypoxic respiratory failure - s/p trach on 03/27/19 - CCM managing  4. PAF - s/p MAZE and LAA occlusion - back in afib today, rates 90s-110s overnight  - continue IV amio  5. AKI  - due to shock/ATN - creatinine 0.90-> 3.7->4.2->4.4 - hopefully will improve with hemodynamic support - may need CVVHD    Length of Stay: [address] PA-C  03/28/2019, 7:16 AM  Advanced Heart Failure Team Pager [phone] (M-F; 7a - 4p)  Please contact [facility] for night-coverage after hours (4p -7a ) and weekends on [email or web address]  Agree with above  Cardiac output much improved with Impella 5.5 and low-dose NE. Urine output improved. Creatinine up slightly but seems like it may be plateauing. Back in AF this am. Awake on vent through trach  General:  Elderly male on vent through trach HEENT: normal Neck: supple. RIJ swan Carotids 2+ bilat; no bruits. No lymphadenopathy or thryomegaly appreciated. Cor:  R ax impella Sternal dressing ok PMI nondisplaced. Irregular rate & rhythm. No rubs, gallops or murmurs. Lungs: clear Abdomen: soft, nontender, nondistended. No hepatosplenomegaly. No bruits or masses. Good bowel sounds. Extremities: no cyanosis, clubbing, rash, edema Neuro: alert & orientedx3, cranial nerves grossly intact. moves all 4 extremities w/o difficulty. Affect pleasant  Overall improved but still very tenuous. Urine output improved  but creatinine up slightly. Suspect he is plateauing. Weight still up 30 pounds so need to continue lasix gtt. Add metolazone 5. Shoot for 2L negative today. No indication for CVHD currently. Continue NE. Does better in NSR. Will bolus IV amio. Increase gtt to 60.   CRITICAL CARE Performed by: Glori Bickers  Total critical care time: 35 minutes  Critical care time was exclusive of separately billable procedures and treating other patients.  Critical care was necessary to treat or prevent imminent or life-threatening deterioration.  Critical care was time spent personally by me (independent of midlevel providers or residents) on the following activities: development of treatment plan with patient and/or surrogate as well as nursing, discussions with consultants, evaluation of patient's response to treatment, examination of patient, obtaining history from patient or surrogate, ordering and performing treatments and interventions, ordering and review of laboratory studies, ordering and review of radiographic studies, pulse oximetry and re-evaluation of patient's condition.  Glori Bickers, MD  8:25 AM

## 2019-03-28 NOTE — Progress Notes (Addendum)
ANTICOAGULATION CONSULT NOTE   Pharmacy Consult for anticoagulation management/assistance  Indication: Impella 5.5  No Known Allergies  Patient Measurements: Height: 5\' 8"  (172.7 cm) Weight: 227 lb 4.7 oz (103.1 kg) IBW/kg (Calculated) : 68.4 Heparin Dosing Weight: 86kg  Vital Signs: Temp: 98.6 F (37 C) (02/10 1900) Temp Source: Core (02/10 1600) BP: 105/77 (02/10 1900) Pulse Rate: 105 (02/10 1900)  Labs: Recent Labs    03/27/19 0514 03/27/19 1043 03/27/19 1255 03/27/19 1305 03/27/19 1600 03/27/19 1621 03/27/19 1621 03/28/19 0417 03/28/19 0432 03/28/19 1532  HGB 6.9*   < > 9.6*   < >  --  9.9*   < > 9.7* 10.5*  --   HCT 22.0*   < > 30.1*   < >  --  29.0*  --  29.5* 31.0*  --   PLT 285  --  298  --   --   --   --  285  --   --   APTT  --   --   --   --   --   --   --  43*  --  59*  LABPROT  --   --  19.5*  --   --   --   --   --   --   --   INR  --   --  1.7*  --   --   --   --   --   --   --   HEPARINUNFRC  --   --   --   --   --   --   --   --   --  <0.10*  CREATININE 3.71*  --  4.19*   < > 4.15*  --   --  4.43*  --  4.61*   < > = values in this interval not displayed.    Estimated Creatinine Clearance: 12.9 mL/min (A) (by C-G formula based on SCr of 4.61 mg/dL (H)).  Assessment: 86 year old male s/p CABG 1/29 with MAZE and LAA. Patient taken to OR 2/9 for tracheostomy and impella 5.5 placement.   Patient currently receiving heparin purge 25 units/ml (1/2 strength) providing about 340 units/hr of heparin.  Heparin infusion is currently at 400 units/hr and heparin level is undetectable.  Spoke to on-call provider, Pharmacy may increase heparin infusion in setting of Afib.  RN reports drainage from Impella site, which has been stable.  Goal of Therapy:  Heparin level 0.2-0.3 units/mL per MD Monitor platelets by anticoagulation protocol: Yes   Plan:  Continue 1/2 strength heparin purge per MD Increase heparin infusion to 800 units/hr per MD F/U AM labs  Will  continue to follow along with surgery  Justus Duerr D. Mina Marble, PharmD, BCPS, [place] 03/28/2019, 7:35 PM

## 2019-03-28 NOTE — Plan of Care (Signed)
  Problem: Education: Goal: Knowledge of General Education information will improve Description: Including pain rating scale, medication(s)/side effects and non-pharmacologic comfort measures Outcome: Progressing   Problem: Health Behavior/Discharge Planning: Goal: Ability to manage health-related needs will improve Outcome: Progressing   Problem: Clinical Measurements: Goal: Ability to maintain clinical measurements within normal limits will improve Outcome: Progressing Goal: Will remain free from infection Outcome: Progressing Goal: Diagnostic test results will improve Outcome: Progressing Goal: Respiratory complications will improve Outcome: Progressing Goal: Cardiovascular complication will be avoided Outcome: Progressing   Problem: Activity: Goal: Risk for activity intolerance will decrease Outcome: Progressing   Problem: Nutrition: Goal: Adequate nutrition will be maintained Outcome: Progressing   Problem: Coping: Goal: Level of anxiety will decrease Outcome: Progressing   Problem: Elimination: Goal: Will not experience complications related to bowel motility Outcome: Progressing Goal: Will not experience complications related to urinary retention Outcome: Progressing   Problem: Pain Managment: Goal: General experience of comfort will improve Outcome: Progressing   Problem: Safety: Goal: Ability to remain free from injury will improve Outcome: Progressing   Problem: Skin Integrity: Goal: Risk for impaired skin integrity will decrease Outcome: Progressing   Problem: Education: Goal: Knowledge of disease or condition will improve Outcome: Progressing Goal: Understanding of medication regimen will improve Outcome: Progressing Goal: Individualized Educational Video(s) Outcome: Progressing   Problem: Activity: Goal: Ability to tolerate increased activity will improve Outcome: Progressing   Problem: Cardiac: Goal: Ability to achieve and maintain  adequate cardiopulmonary perfusion will improve Outcome: Progressing   Problem: Health Behavior/Discharge Planning: Goal: Ability to safely manage health-related needs after discharge will improve Outcome: Progressing   Problem: Education: Goal: Understanding of CV disease, CV risk reduction, and recovery process will improve Outcome: Progressing Goal: Individualized Educational Video(s) Outcome: Progressing   Problem: Activity: Goal: Ability to return to baseline activity level will improve Outcome: Progressing   Problem: Cardiovascular: Goal: Ability to achieve and maintain adequate cardiovascular perfusion will improve Outcome: Progressing Goal: Vascular access site(s) Level 0-1 will be maintained Outcome: Progressing   Problem: Health Behavior/Discharge Planning: Goal: Ability to safely manage health-related needs after discharge will improve Outcome: Progressing

## 2019-03-29 ENCOUNTER — Inpatient Hospital Stay (HOSPITAL_COMMUNITY): Payer: Medicare Other

## 2019-03-29 DIAGNOSIS — N179 Acute kidney failure, unspecified: Secondary | ICD-10-CM

## 2019-03-29 LAB — POCT I-STAT, CHEM 8
BUN: >130 mg/dL — ABNORMAL HIGH (ref 8–23)
Calcium, Ion: 0.99 mmol/L — ABNORMAL LOW (ref 1.15–1.40)
Chloride: 91 mmol/L — ABNORMAL LOW (ref 98–111)
Creatinine, Ser: 4.8 mg/dL — ABNORMAL HIGH (ref 0.61–1.24)
Glucose, Bld: 171 mg/dL — ABNORMAL HIGH (ref 70–99)
HCT: 32 % — ABNORMAL LOW (ref 39.0–52.0)
Hemoglobin: 10.9 g/dL — ABNORMAL LOW (ref 13.0–17.0)
Potassium: 3.6 mmol/L (ref 3.5–5.1)
Sodium: 128 mmol/L — ABNORMAL LOW (ref 135–145)
TCO2: 21 mmol/L — ABNORMAL LOW (ref 22–32)

## 2019-03-29 LAB — COMPREHENSIVE METABOLIC PANEL WITH GFR
ALT: 32 U/L (ref 0–44)
AST: 52 U/L — ABNORMAL HIGH (ref 15–41)
Albumin: 2.1 g/dL — ABNORMAL LOW (ref 3.5–5.0)
Alkaline Phosphatase: 57 U/L (ref 38–126)
Anion gap: 18 — ABNORMAL HIGH (ref 5–15)
BUN: 191 mg/dL — ABNORMAL HIGH (ref 8–23)
CO2: 20 mmol/L — ABNORMAL LOW (ref 22–32)
Calcium: 7.5 mg/dL — ABNORMAL LOW (ref 8.9–10.3)
Chloride: 92 mmol/L — ABNORMAL LOW (ref 98–111)
Creatinine, Ser: 5.04 mg/dL — ABNORMAL HIGH (ref 0.61–1.24)
GFR calc Af Amer: 11 mL/min — ABNORMAL LOW
GFR calc non Af Amer: 9 mL/min — ABNORMAL LOW
Glucose, Bld: 196 mg/dL — ABNORMAL HIGH (ref 70–99)
Potassium: 3.7 mmol/L (ref 3.5–5.1)
Sodium: 130 mmol/L — ABNORMAL LOW (ref 135–145)
Total Bilirubin: 0.6 mg/dL (ref 0.3–1.2)
Total Protein: 5.5 g/dL — ABNORMAL LOW (ref 6.5–8.1)

## 2019-03-29 LAB — POCT I-STAT 7, (LYTES, BLD GAS, ICA,H+H)
Acid-base deficit: 3 mmol/L — ABNORMAL HIGH (ref 0.0–2.0)
Bicarbonate: 21.9 mmol/L (ref 20.0–28.0)
Calcium, Ion: 1 mmol/L — ABNORMAL LOW (ref 1.15–1.40)
HCT: 26 % — ABNORMAL LOW (ref 39.0–52.0)
Hemoglobin: 8.8 g/dL — ABNORMAL LOW (ref 13.0–17.0)
O2 Saturation: 95 %
Patient temperature: 36.3
Potassium: 3.6 mmol/L (ref 3.5–5.1)
Sodium: 129 mmol/L — ABNORMAL LOW (ref 135–145)
TCO2: 23 mmol/L (ref 22–32)
pCO2 arterial: 36.2 mmHg (ref 32.0–48.0)
pH, Arterial: 7.387 (ref 7.350–7.450)
pO2, Arterial: 72 mmHg — ABNORMAL LOW (ref 83.0–108.0)

## 2019-03-29 LAB — RENAL FUNCTION PANEL
Albumin: 2.2 g/dL — ABNORMAL LOW (ref 3.5–5.0)
Anion gap: 19 — ABNORMAL HIGH (ref 5–15)
BUN: 172 mg/dL — ABNORMAL HIGH (ref 8–23)
CO2: 20 mmol/L — ABNORMAL LOW (ref 22–32)
Calcium: 7.5 mg/dL — ABNORMAL LOW (ref 8.9–10.3)
Chloride: 92 mmol/L — ABNORMAL LOW (ref 98–111)
Creatinine, Ser: 4.43 mg/dL — ABNORMAL HIGH (ref 0.61–1.24)
GFR calc Af Amer: 13 mL/min — ABNORMAL LOW
GFR calc non Af Amer: 11 mL/min — ABNORMAL LOW
Glucose, Bld: 175 mg/dL — ABNORMAL HIGH (ref 70–99)
Phosphorus: 8.5 mg/dL — ABNORMAL HIGH (ref 2.5–4.6)
Potassium: 3.7 mmol/L (ref 3.5–5.1)
Sodium: 131 mmol/L — ABNORMAL LOW (ref 135–145)

## 2019-03-29 LAB — CBC
HCT: 26.7 % — ABNORMAL LOW (ref 39.0–52.0)
Hemoglobin: 8.8 g/dL — ABNORMAL LOW (ref 13.0–17.0)
MCH: 28.5 pg (ref 26.0–34.0)
MCHC: 33 g/dL (ref 30.0–36.0)
MCV: 86.4 fL (ref 80.0–100.0)
Platelets: 238 10*3/uL (ref 150–400)
RBC: 3.09 MIL/uL — ABNORMAL LOW (ref 4.22–5.81)
RDW: 15.9 % — ABNORMAL HIGH (ref 11.5–15.5)
WBC: 14.8 10*3/uL — ABNORMAL HIGH (ref 4.0–10.5)
nRBC: 0 % (ref 0.0–0.2)

## 2019-03-29 LAB — LACTATE DEHYDROGENASE: LDH: 266 U/L — ABNORMAL HIGH (ref 98–192)

## 2019-03-29 LAB — COOXEMETRY PANEL
Carboxyhemoglobin: 1.4 % (ref 0.5–1.5)
Methemoglobin: 0.9 % (ref 0.0–1.5)
O2 Saturation: 59.4 %
Total hemoglobin: 12.7 g/dL (ref 12.0–16.0)

## 2019-03-29 LAB — GLUCOSE, CAPILLARY
Glucose-Capillary: 140 mg/dL — ABNORMAL HIGH (ref 70–99)
Glucose-Capillary: 152 mg/dL — ABNORMAL HIGH (ref 70–99)
Glucose-Capillary: 168 mg/dL — ABNORMAL HIGH (ref 70–99)
Glucose-Capillary: 170 mg/dL — ABNORMAL HIGH (ref 70–99)
Glucose-Capillary: 178 mg/dL — ABNORMAL HIGH (ref 70–99)
Glucose-Capillary: 184 mg/dL — ABNORMAL HIGH (ref 70–99)
Glucose-Capillary: 208 mg/dL — ABNORMAL HIGH (ref 70–99)

## 2019-03-29 LAB — APTT
aPTT: 176 seconds (ref 24–36)
aPTT: 126 seconds — ABNORMAL HIGH (ref 24–36)

## 2019-03-29 LAB — HEPARIN LEVEL (UNFRACTIONATED)
Heparin Unfractionated: 0.34 [IU]/mL (ref 0.30–0.70)
Heparin Unfractionated: 0.38 IU/mL (ref 0.30–0.70)

## 2019-03-29 MED ORDER — PRISMASOL BGK 4/2.5 32-4-2.5 MEQ/L IV SOLN
INTRAVENOUS | Status: DC
  Administered 2019-04-06 – 2019-04-09 (×10): 1500 mL/h via INTRAVENOUS_CENTRAL

## 2019-03-29 MED ORDER — HEPARIN SODIUM (PORCINE) 1000 UNIT/ML DIALYSIS
1000.0000 [IU] | INTRAMUSCULAR | Status: DC | PRN
  Administered 2019-03-29: 3200 [IU] via INTRAVENOUS_CENTRAL
  Filled 2019-03-29: qty 4
  Filled 2019-03-29 (×2): qty 6

## 2019-03-29 MED ORDER — SODIUM CHLORIDE 0.9 % IV SOLN
2.0000 g | Freq: Two times a day (BID) | INTRAVENOUS | Status: AC
  Administered 2019-03-29 – 2019-04-03 (×11): 2 g via INTRAVENOUS
  Filled 2019-03-29 (×13): qty 2

## 2019-03-29 MED ORDER — HEPARIN (PORCINE) 2000 UNITS/L FOR CRRT
INTRAVENOUS_CENTRAL | Status: DC | PRN
  Filled 2019-03-29 (×3): qty 1000

## 2019-03-29 MED ORDER — LIDOCAINE HCL (PF) 1 % IJ SOLN
INTRAMUSCULAR | Status: AC
  Filled 2019-03-29: qty 30

## 2019-03-29 MED ORDER — PRISMASOL BGK 4/2.5 32-4-2.5 MEQ/L REPLACEMENT SOLN
Status: DC
  Administered 2019-04-06 – 2019-04-08 (×3): 400 mL via INTRAVENOUS_CENTRAL

## 2019-03-29 MED ORDER — AMIODARONE IV BOLUS ONLY 150 MG/100ML
150.0000 mg | Freq: Once | INTRAVENOUS | Status: AC
  Administered 2019-03-29: 150 mg via INTRAVENOUS

## 2019-03-29 MED ORDER — DEXMEDETOMIDINE HCL IN NACL 400 MCG/100ML IV SOLN
0.0000 ug/kg/h | INTRAVENOUS | Status: AC
  Administered 2019-03-29 (×2): 0.1 ug/kg/h via INTRAVENOUS
  Administered 2019-03-31: 0.4 ug/kg/h via INTRAVENOUS
  Filled 2019-03-29 (×2): qty 100

## 2019-03-29 MED ORDER — HEPARIN SODIUM (PORCINE) 5000 UNIT/ML IJ SOLN
25000.0000 [IU] | INTRAVENOUS | Status: DC
  Administered 2019-03-29 – 2019-04-07 (×4): 25000 [IU]
  Filled 2019-03-29 (×8): qty 5

## 2019-03-29 MED ORDER — PRISMASOL BGK 4/2.5 32-4-2.5 MEQ/L REPLACEMENT SOLN: Status: DC

## 2019-03-29 MED ORDER — HEPARIN SODIUM (PORCINE) 1000 UNIT/ML DIALYSIS
1000.0000 [IU] | INTRAMUSCULAR | Status: DC | PRN
  Filled 2019-03-29: qty 4
  Filled 2019-03-29: qty 6

## 2019-03-29 NOTE — Progress Notes (Signed)
2 Days Post-Op Procedure(s) (LRB): TRACHEOSTOMY placed using Shiley 8DCT Cuffed. (N/A) Placement Of Impella Left Ventricular Assist Device using ABIOMED Impella 5.5 with SmartAssist Device. (Right) Subjective: Responsive on ventilator, comfortable with IV sedation Urine output dropping off despite high-dose IV Lasix drip Rate controlled A. Fib Impella placed postop day 12 for hemodynamic support Flow 4.2 L/min Objective: Vital signs in last 24 hours: Temp:  [96.6 F (35.9 C)-99.1 F (37.3 C)] 97 F (36.1 C) (02/11 1300) Pulse Rate:  [43-115] 91 (02/11 1300) Cardiac Rhythm: Atrial fibrillation (02/11 0800) Resp:  [16-33] 26 (02/11 1300) BP: (88-132)/(64-118) 94/83 (02/11 1300) SpO2:  [92 %-99 %] 95 % (02/11 1300) Arterial Line BP: (71-169)/(53-87) 106/64 (02/11 1300) FiO2 (%):  [40 %] 40 % (02/11 1136) Weight:  [104.3 kg] 104.3 kg (02/11 0500)  Hemodynamic parameters for last 24 hours: PAP: (42-66)/(15-31) 51/22 CVP:  [8 mmHg-21 mmHg] 15 mmHg PCWP:  [20 mmHg-22 mmHg] 22 mmHg CO:  [5.1 L/min-6.1 L/min] 5.1 L/min CI:  [2.4 L/min/m2-2.9 L/min/m2] 2.4 L/min/m2  Intake/Output from previous day: 02/10 0701 - 02/11 0700 In: 3646.3 [I.V.:2349.8; NG/GT:809.3; IV Piggyback:200.1] Out: 1465 [Urine:1465] Intake/Output this shift: Total I/O In: 1556.8 [I.V.:636.8; Blood:345; Other:95; NG/GT:380; IV Piggyback:100.1] Out: 854 [Urine:195; Other:6]  EXAM  Responsive on ventilator Tracheostomy site with minimal drainage Sternal Aquacel dressing intact No hematoma at Impella axillary artery site Breath sounds clear Abdomen soft Significant diffuse edema of tissues  Lab Results: Recent Labs    03/28/19 0417 03/28/19 0432 03/29/19 0417 03/29/19 0425  WBC 21.0*  --  14.8*  --   HGB 9.7*   < > 8.8* 8.8*  HCT 29.5*   < > 26.7* 26.0*  PLT 285  --  238  --    < > = values in this interval not displayed.   BMET:  Recent Labs    03/28/19 1532 03/28/19 1532 03/29/19 0417  03/29/19 0425  NA 131*   < > 130* 129*  K 3.7   < > 3.7 3.6  CL 91*  --  92*  --   CO2 20*  --  20*  --   GLUCOSE 180*  --  196*  --   BUN 177*  --  191*  --   CREATININE 4.61*  --  5.04*  --   CALCIUM 7.5*  --  7.5*  --    < > = values in this interval not displayed.    PT/INR:  Recent Labs    03/27/19 1255  LABPROT 19.5*  INR 1.7*   ABG    Component Value Date/Time   PHART 7.387 03/29/2019 0425   HCO3 21.9 03/29/2019 0425   TCO2 23 03/29/2019 0425   ACIDBASEDEF 3.0 (H) 03/29/2019 0425   O2SAT 95.0 03/29/2019 0425   CBG (last 3)  Recent Labs    03/29/19 0006 03/29/19 0422 03/29/19 0749  GLUCAP 208* 184* 170*    Assessment/Plan: S/P Procedure(s) (LRB): TRACHEOSTOMY placed using Shiley 8DCT Cuffed. (N/A) Placement Of Impella Left Ventricular Assist Device using ABIOMED Impella 5.5 with SmartAssist Device. (Right) HD dialysis catheter placed for CRT as patient needs volume removed.  Stop Lasix drip. Will DC Foley catheter once CRT starts Plan placement of core track feeding tube postpyloric tomorrow Continue antibiotics[Fortaz] for Pseudomonas in sputum Expect patient will need Impella support for over a week to promote reversal of organ dysfunction.  Heparin drip for Impella anticoagulation now at 800 units/h, goal heparin level 0.3  LOS: 15 days    Collier Salina  Lucianne Lei Trigt III 03/29/2019

## 2019-03-29 NOTE — Progress Notes (Signed)
ANTICOAGULATION CONSULT NOTE   Pharmacy Consult for anticoagulation management/assistance  Indication: Impella 5.5  No Known Allergies  Patient Measurements: Height: 5\' 8"  (172.7 cm) Weight: 229 lb 15 oz (104.3 kg) IBW/kg (Calculated) : 68.4 Heparin Dosing Weight: 86kg  Vital Signs: Temp: 95.7 F (35.4 C) (02/11 1900) BP: 103/77 (02/11 1900) Pulse Rate: 100 (02/11 1900)  Labs: Recent Labs    03/27/19 1255 03/27/19 1305 03/28/19 0417 03/28/19 0432 03/28/19 1532 03/29/19 0417 03/29/19 0417 03/29/19 0425 03/29/19 0942 03/29/19 1541 03/29/19 1552 03/29/19 1735  HGB 9.6*   < > 9.7*   < >  --  8.8*   < > 8.8*  --   --  10.9*  --   HCT 30.1*   < > 29.5*   < >  --  26.7*  --  26.0*  --   --  32.0*  --   PLT 298  --  285  --   --  238  --   --   --   --   --   --   APTT  --   --  43*   < > 59* 176*  --   --  126*  --   --   --   LABPROT 19.5*  --   --   --   --   --   --   --   --   --   --   --   INR 1.7*  --   --   --   --   --   --   --   --   --   --   --   HEPARINUNFRC  --   --   --   --  <0.10* 0.34  --   --   --   --   --  0.38  CREATININE 4.19*   < > 4.43*   < > 4.61* 5.04*  --   --   --  4.43* 4.80*  --    < > = values in this interval not displayed.    Estimated Creatinine Clearance: 12.5 mL/min (A) (by C-G formula based on SCr of 4.8 mg/dL (H)).  Assessment: 86 year old male s/p CABG 1/29 with MAZE and LAA. Patient taken to OR 2/9 for tracheostomy and impella 5.5 placement.  Patient currently receiving heparin purge 25 units/ml (1/2 strength) providing about 340 units/hr of heparin.    He is currently in Afib and heparin level is slightly above desired range at 0.38 units/mL.  RN reports dark BM earlier today; no overt bleeding observed.  No heparin through CRRT.  Goal of Therapy:  Heparin level 0.2-0.3 units/mL per MD Monitor platelets by anticoagulation protocol: Yes   Plan:   Continue 1/2 strength heparin purge per MD Reduce heparin infusion slightly to  750 units/hr Q12H heparin level Will continue to follow along with surgery  Sydnee Lamour D. Mina Marble, PharmD, BCPS, [place] 03/29/2019, 7:49 PM

## 2019-03-29 NOTE — Consult Note (Signed)
Reason for Consult: Acute kidney injury, volume overload refractory to diuretics Referring Physician: Glori Bickers MD (cardiology)  HPI:  86 year old Caucasian man with past medical history significant for diet-controlled diabetes, non-Hodgkin's follicular lymphoma with chronic anemia, coronary artery disease status post PCI in 4481, chronic diastolic heart failure and apparently normal renal function at baseline (admission creatinine 0.7-1.0). He was admitted to the hospital 2 weeks ago with chest tightness and palpitations with some shortness of breath and found to have atrial fibrillation with rapid ventricular response failing DCCV.  He underwent coronary angiography/right heart catheterization on 1/28 and coronary artery bypass grafting 3 with left atrial Maze procedure and clipping of atrial appendage on 03/16/2019.  He unfortunately developed postoperative congestive heart failure that has been difficult to treat with diuretic/volume unloading due to worsening renal insufficiency/azotemia resulting in reintubation/continued ventilator dependence.  Yesterday he underwent tracheostomy and placement of an Impella left ventricular assist device.  I am asked to assist with extracorporeal volume unloading/management of azotemia which appears to be appropriate at this point.  Past Medical History:  Diagnosis Date  . Atrial fibrillation ([place])   . CAD (coronary artery disease)    a. s/p PCI in 1992 b. Coronary CT in 01/2018 showing extensive coronary calcification; FFR indeterminate --> medical management pursued at that time as patient not interested in repeat cath  . Cancer [facility])    prostate  . Dyspnea   . Hypertension     Past Surgical History:  Procedure Laterality Date  . CLIPPING OF ATRIAL APPENDAGE N/A 03/16/2019   Procedure: CLIPPING OF LEFT  ATRIAL APPENDAGE - USING ATRICLIP SIZE 40;  Surgeon: Ivin Poot, MD;  Location: [place];  Service: Open Heart Surgery;  Laterality: N/A;  .  COLONOSCOPY N/A 08/25/2017   Procedure: COLONOSCOPY;  Surgeon: Rogene Houston, MD;  [facility];  Service: Endoscopy;  Laterality: N/A;  . CORONARY ARTERY BYPASS GRAFT N/A 03/16/2019   Procedure: CORONARY ARTERY BYPASS GRAFTING (CABG), ON PUMP, TIMES THREE, USING LEFT INTERNAL MAMMARY ARTERY, RIGHT GREATER SAPHENOUS VEIN HARVESTED ENDOSCOPICALLY;  Surgeon: Ivin Poot, MD;  Location: [place];  Service: Open Heart Surgery;  Laterality: N/A;  swan only  . HERNIA REPAIR    . MAZE N/A 03/16/2019   Procedure: MAZE;  Surgeon: Ivin Poot, MD;  Location: [place];  Service: Open Heart Surgery;  Laterality: N/A;  . PLACEMENT OF IMPELLA LEFT VENTRICULAR ASSIST DEVICE Right 03/27/2019   Procedure: Placement Of Impella Left Ventricular Assist Device using ABIOMED Impella 5.5 with SmartAssist Device.;  Surgeon: Wonda Olds, MD;  Location: [facility];  Service: Thoracic;  Laterality: Right;  . PROSTATE SURGERY    . RIGHT/LEFT HEART CATH AND CORONARY ANGIOGRAPHY N/A 03/15/2019   Procedure: RIGHT/LEFT HEART CATH AND CORONARY ANGIOGRAPHY;  Surgeon: Burnell Blanks, MD;  Location: [place] [facility];  Service: Cardiovascular;  Laterality: N/A;  . TEE WITHOUT CARDIOVERSION N/A 03/16/2019   Procedure: TRANSESOPHAGEAL ECHOCARDIOGRAM (TEE);  Surgeon: Prescott Gum, Collier Salina, MD;  Location: [place];  Service: Open Heart Surgery;  Laterality: N/A;  . TRACHEOSTOMY TUBE PLACEMENT N/A 03/27/2019   Procedure: TRACHEOSTOMY placed using Shiley 8DCT Cuffed.;  Surgeon: Wonda Olds, MD;  Location: [facility];  Service: Thoracic;  Laterality: N/A;    Family History  Problem Relation Age of Onset  . CAD Mother 37  . CAD Father 75  . CAD Sister   . CAD Brother     Social History:  reports that he quit smoking  about 29 years ago. His smoking use included cigarettes. He has a 12.50 pack-year smoking history. He has never used smokeless tobacco. He reports that he does not drink alcohol or use drugs.  Allergies: No  Known Allergies  Medications:  Scheduled: . sodium chloride   Intravenous Once  . aspirin  81 mg Per Tube Daily  . atorvastatin  80 mg Per Tube q1800  . bethanechol  5 mg Per Tube TID  . bisacodyl  10 mg Oral Daily   Or  . bisacodyl  10 mg Rectal Daily  . chlorhexidine gluconate (MEDLINE KIT)  15 mL Mouth Rinse BID  . Chlorhexidine Gluconate Cloth  6 each Topical Daily  . docusate  200 mg Per Tube Daily  . feeding supplement (PRO-STAT SUGAR FREE 64)  60 mL Per Tube BID  . insulin aspart  0-24 Units Subcutaneous Q4H  . insulin glargine  18 Units Subcutaneous Daily  . lidocaine  5 mL Intradermal Once  . mouth rinse  15 mL Mouth Rinse 10 times per day  . pantoprazole (PROTONIX) IV  40 mg Intravenous Q24H  . psyllium  1 packet Per Tube BID  . sodium chloride flush  10-40 mL Intracatheter Q12H    BMP Latest Ref Rng & Units 03/29/2019 03/29/2019 03/28/2019  Glucose 70 - 99 mg/dL - 196(H) 180(H)  BUN 8 - 23 mg/dL - 191(H) 177(H)  Creatinine 0.61 - 1.24 mg/dL - 5.04(H) 4.61(H)  Sodium 135 - 145 mmol/L 129(L) 130(L) 131(L)  Potassium 3.5 - 5.1 mmol/L 3.6 3.7 3.7  Chloride 98 - 111 mmol/L - 92(L) 91(L)  CO2 22 - 32 mmol/L - 20(L) 20(L)  Calcium 8.9 - 10.3 mg/dL - 7.5(L) 7.5(L)   CBC Latest Ref Rng & Units 03/29/2019 03/29/2019 03/28/2019  WBC 4.0 - 10.5 K/uL - 14.8(H) -  Hemoglobin 13.0 - 17.0 g/dL 8.8(L) 8.8(L) 10.5(L)  Hematocrit 39.0 - 52.0 % 26.0(L) 26.7(L) 31.0(L)  Platelets 150 - 400 K/uL - 238 -     DG Chest Port 1 View  Result Date: 03/29/2019 CLINICAL DATA:  Check Impella placement EXAM: PORTABLE CHEST 1 VIEW COMPARISON:  03/27/2018 FINDINGS: Cardiac shadow is enlarged but stable. Postsurgical changes are again noted. Impella device is well as a Swan-Ganz catheter are again seen and stable. Tracheostomy tube, gastric catheter and right-sided PICC line are noted in satisfactory position. Persistent bilateral pleural effusions are noted left slightly greater than right layering  posteriorly. Vascular congestion remains. No new focal infiltrate is seen. IMPRESSION: Overall stable appearance of the chest Tubes and lines stable in appearance. Electronically Signed   By: Inez Catalina M.D.   On: 03/29/2019 09:05   DG Chest Port 1 View  Result Date: 03/28/2019 CLINICAL DATA:  CABG.  Tracheostomy.  Impella placement. EXAM: PORTABLE CHEST 1 VIEW COMPARISON:  03/27/2019 FINDINGS: Right-sided Impella device unchanged with tip projecting over the left heart. Tracheostomy tube unchanged. Right IJ Swan-Ganz catheter has tip over the main pulmonary artery segment. Right-sided PICC line unchanged. Enteric tube courses into the region of the stomach and off the film as tip is not visualized. Lungs are adequately inflated demonstrate persistent hazy bilateral perihilar and bibasilar opacification likely mild interstitial edema with small bilateral pleural effusions/bibasilar atelectasis. Cardiomediastinal silhouette and remainder of the exam is unchanged. IMPRESSION: 1. Stable hazy opacification over the perihilar bibasilar regions likely interstitial edema with bilateral effusions/bibasilar atelectasis. 2.  Tubes and lines as described. Electronically Signed   By: Marin Olp M.D.   On: 03/28/2019  08:54   DG Chest Port 1 View  Result Date: 03/27/2019 CLINICAL DATA:  Status post tracheostomy placement EXAM: PORTABLE CHEST 1 VIEW COMPARISON:  03/27/2019 FINDINGS: Cardiac shadow is prominent but stable. Impella device is noted in place. Postsurgical changes are again seen and stable. Right-sided PICC line is noted at the cavoatrial junction. New tracheostomy tube is seen. Feeding catheter has been removed in the interval. Diffuse haziness is noted over both lungs likely related to a posteriorly layering effusion bilaterally. Mild vascular congestion is again seen. Swan-Ganz catheter is noted in satisfactory position. IMPRESSION: Mild vascular congestion and small effusions posteriorly. Tubes and  lines as described. Electronically Signed   By: Inez Catalina M.D.   On: 03/27/2019 13:16   DG Abd Portable 1V  Result Date: 03/27/2019 CLINICAL DATA:  NG tube placement. EXAM: PORTABLE ABDOMEN - 1 VIEW COMPARISON:  Radiograph earlier this day FINDINGS: The previous weighted enteric tube is been removed. There is a new enteric tube with tip and side-port below the diaphragm in the distal stomach. Swan-Ganz catheter and intra-aortic balloon pump are partially included in the lower chest. Nonobstructive bowel gas pattern. IMPRESSION: Tip and side port of the enteric tube below the diaphragm in the distal stomach. Electronically Signed   By: Keith Rake M.D.   On: 03/27/2019 13:46    Review of Systems  Unable to perform ROS: Intubated   Blood pressure 100/83, pulse 93, temperature (!) 96.6 F (35.9 C), resp. rate (!) 28, height '5\' 8"'  (1.727 m), weight 104.3 kg, SpO2 93 %. Physical Exam  Nursing note and vitals reviewed. Constitutional: He appears well-developed and well-nourished.  Awake, attempt to communicate  HENT:  Head: Normocephalic and atraumatic.  Mouth/Throat: No oropharyngeal exudate.  Eyes: Pupils are equal, round, and reactive to light. EOM are normal.  Neck:  ET tube in situ  Cardiovascular: Normal rate, regular rhythm and normal heart sounds.  No murmur heard. Respiratory: Effort normal.  Coarse breath sounds bilaterally with dressing over sternotomy incision  GI: Soft. Bowel sounds are normal. He exhibits distension. There is no abdominal tenderness. There is no guarding.  Musculoskeletal:        General: Edema present.     Cervical back: Normal range of motion and neck supple.     Comments: 1-2+ lower extremity/dependent edema  Neurological: He is alert.  Skin: Skin is warm and dry. There is pallor.    Assessment/Plan: 1.  Acute kidney injury: Appears to have been gradually progressive and likely compounded by postoperative congestive heart failure/cardiogenic  shock now refractory to diuretics with fixed urine output and worsening azotemia.  We will begin CRRT for extracorporeal volume unloading. 2.  Acute systolic congestive heart failure with cardiogenic shock: Left ventricular assist device/Impella support and diuretic refractory so far, begin CRRT for volume unloading. 3.  Acute hypoxic respiratory failure: Primarily volume mediated, attempt volume unloading with CRRT to assist with weaning. 4.  Coronary artery disease status post three-vessel CABG 5.  Paroxysmal atrial fibrillation: Status post Maze procedure and clipping of atrial appendage, on intravenous amiodarone and currently back in sinus rhythm. 6.  Hyponatremia: Secondary to CHF exacerbation, monitor with CRRT/UF  Cami Delawder K. 03/29/2019, 12:22 PM

## 2019-03-29 NOTE — Progress Notes (Signed)
Spoke with MD Posey Pronto regarding running CRRT with M100 filter as there are no M150's on unit. Given instruction to switch to M150 when filter needs replacing.

## 2019-03-29 NOTE — Procedures (Signed)
Under sterile prep gown mask glove and drape under local 1 % lidocaine a proper  timeout was performed and Seldinger technique used to place HD cath centrally via L subclavian vein Sterile dressing placed PCXR pending  Ivin Poot  MD

## 2019-03-29 NOTE — Progress Notes (Signed)
PHARMACY NOTE:  ANTIMICROBIAL RENAL DOSAGE ADJUSTMENT  Current antimicrobial regimen includes a mismatch between antimicrobial dosage and estimated renal function.  As per policy approved by the Pharmacy & Therapeutics and Medical Executive Committees, the antimicrobial dosage will be adjusted accordingly.  Current antimicrobial dosage:  Fortaz 2g IV q24h  Indication: Pseudomonal PNA  Renal Function:  Estimated Creatinine Clearance: 11.9 mL/min (A) (by C-G formula based on SCr of 5.04 mg/dL (H)). []      On intermittent HD, scheduled: [x]      On CRRT    Antimicrobial dosage has been changed to:  Fortaz 2g IV q12h    Thank you for allowing pharmacy to be a part of this patient's care.   Arrie Senate, PharmD, BCPS Clinical Pharmacist [phone] Please check AMION for all [place] numbers 03/29/2019

## 2019-03-29 NOTE — Progress Notes (Addendum)
Advanced Heart Failure Rounding Note  PCP-Cardiologist: Jesse Sable, MD   Subjective:    Remains on impella 5.5 support, P-6, Flow 4.0  Co-ox 59%  Remains in afib. Rates 90s-110s.   Off NE. On Epi 2. MAPs 80s  SCr continues to rise, 0.90-> 3.7->4.2->4.4->4.6->5.04.  K 3.7   Only 1.5L in UOP yesterday despite increase in lasix gtt to 30 mg/hr + addition of metolazone 5 mg. Wt continues to trend up. Overall up 30 lb.    Swan #s CVP 20 PAP 46/22 CO 5.1 CI 2.4    Objective:   Weight Range: 104.3 kg Body mass index is 34.96 kg/m.   Vital Signs:   Temp:  [97 F (36.1 C)-99.3 F (37.4 C)] 97.2 F (36.2 C) (02/11 0700) Pulse Rate:  [43-245] 98 (02/11 0733) Resp:  [16-33] 30 (02/11 0733) BP: (88-199)/(60-139) 92/78 (02/11 0700) SpO2:  [93 %-99 %] 95 % (02/11 0733) Arterial Line BP: (71-169)/(53-83) 114/71 (02/11 0700) FiO2 (%):  [40 %] 40 % (02/11 0733) Weight:  [104.3 kg] 104.3 kg (02/11 0500) Last BM Date: 03/27/19  Weight change: Filed Weights   03/27/19 0500 03/28/19 0500 03/29/19 0500  Weight: 100.5 kg 103.1 kg 104.3 kg    Intake/Output:   Intake/Output Summary (Last 24 hours) at 03/29/2019 0745 Last data filed at 03/29/2019 0700 Gross per 24 hour  Intake 3646.25 ml  Output 1465 ml  Net 2181.25 ml      Physical Exam    General:  [name] on vent through trach, awake, edematous HEENT: Normal + NGT Neck: Supple. + TC. Rt IJ Swan, unable to assess JVD. Carotids 2+ bilat; no bruits. No lymphadenopathy or thyromegaly appreciated. Cor: PMI nondisplaced. Irregularly irregular rhythm, tachy rate. No rubs, gallops or murmurs. Lungs: clear. Remains on vent through trach Abdomen: distended. No hepatosplenomegaly. No bruits or masses. Good bowel sounds. Extremities: No cyanosis, clubbing, rash, 2-3+ bilateral LEE, upper extremities edematous Neuro: awake on vent  Telemetry   afib 90s-low 100s Personally reviewed   EKG    No  new EKG to review   Labs    CBC Recent Labs    03/28/19 0417 03/28/19 0432 03/29/19 0417 03/29/19 0425  WBC 21.0*  --  14.8*  --   HGB 9.7*   < > 8.8* 8.8*  HCT 29.5*   < > 26.7* 26.0*  MCV 86.3  --  86.4  --   PLT 285  --  238  --    < > = values in this interval not displayed.   Basic Metabolic Panel Recent Labs    03/27/19 1600 03/27/19 1621 03/28/19 1532 03/28/19 1532 03/29/19 0417 03/29/19 0425  NA 134*   < > 131*   < > 130* 129*  K 3.8   < > 3.7   < > 3.7 3.6  CL 95*   < > 91*  --  92*  --   CO2 21*   < > 20*  --  20*  --   GLUCOSE 164*   < > 180*  --  196*  --   BUN 166*   < > 177*  --  191*  --   CREATININE 4.15*   < > 4.61*  --  5.04*  --   CALCIUM 7.4*   < > 7.5*  --  7.5*  --   PHOS 7.9*  --  8.1*  --   --   --    < > =  values in this interval not displayed.   Liver Function Tests Recent Labs    03/28/19 0417 03/28/19 0417 03/28/19 1532 03/29/19 0417  AST 73*  --   --  52*  ALT 63*  --   --  32  ALKPHOS 67  --   --  57  BILITOT 1.0  --   --  0.6  PROT 5.6*  --   --  5.5*  ALBUMIN 2.4*   < > 2.2* 2.1*   < > = values in this interval not displayed.   No results for input(s): LIPASE, AMYLASE in the last 72 hours. Cardiac Enzymes No results for input(s): CKTOTAL, CKMB, CKMBINDEX, TROPONINI in the last 72 hours.  BNP: BNP (last 3 results) Recent Labs    03/22/19 0401 03/26/19 0626 03/27/19 1255  BNP 340.4* 687.5* 800.4*    ProBNP (last 3 results) No results for input(s): PROBNP in the last 8760 hours.   D-Dimer No results for input(s): DDIMER in the last 72 hours. Hemoglobin A1C No results for input(s): HGBA1C in the last 72 hours. Fasting Lipid Panel No results for input(s): CHOL, HDL, LDLCALC, TRIG, CHOLHDL, LDLDIRECT in the last 72 hours. Thyroid Function Tests No results for input(s): TSH, T4TOTAL, T3FREE, THYROIDAB in the last 72 hours.  Invalid input(s): FREET3  Other results:   Imaging    No results  found.   Medications:     Scheduled Medications: . sodium chloride   Intravenous Once  . aspirin  81 mg Per Tube Daily  . atorvastatin  80 mg Per Tube q1800  . bethanechol  5 mg Per Tube TID  . bisacodyl  10 mg Oral Daily   Or  . bisacodyl  10 mg Rectal Daily  . chlorhexidine gluconate (MEDLINE KIT)  15 mL Mouth Rinse BID  . Chlorhexidine Gluconate Cloth  6 each Topical Daily  . docusate  200 mg Per Tube Daily  . feeding supplement (PRO-STAT SUGAR FREE 64)  60 mL Per Tube BID  . insulin aspart  0-24 Units Subcutaneous Q4H  . insulin glargine  18 Units Subcutaneous Daily  . lidocaine  5 mL Intradermal Once  . mouth rinse  15 mL Mouth Rinse 10 times per day  . metolazone  5 mg Oral Daily  . pantoprazole (PROTONIX) IV  40 mg Intravenous Q24H  . psyllium  1 packet Per Tube BID  . sodium chloride flush  10-40 mL Intracatheter Q12H    Infusions: . sodium chloride    . sodium chloride 10 mL/hr at 03/29/19 0700  . sodium chloride 10 mL/hr at 03/26/19 1153  . sodium chloride Stopped (03/27/19 0945)  . amiodarone 60 mg/hr (03/29/19 0700)  . cefTAZidime (FORTAZ)  IV Stopped (03/28/19 1102)  . dexmedetomidine (PRECEDEX) IV infusion 0.1 mcg/kg/hr (03/29/19 0700)  . epinephrine 2 mcg/min (03/29/19 0700)  . feeding supplement (VITAL AF 1.2 CAL) 1,000 mL (03/28/19 1302)  . furosemide (LASIX) infusion 30 mg/hr (03/29/19 0700)  . impella catheter heparin 25 unit/mL in dextrose 5%    . heparin 800 Units/hr (03/29/19 0700)  . norepinephrine (LEVOPHED) Adult infusion Stopped (03/28/19 2129)    PRN Medications: sodium chloride, dextrose, docusate, fentaNYL (SUBLIMAZE) injection, fentaNYL (SUBLIMAZE) injection, midazolam, ondansetron (ZOFRAN) IV, sodium chloride flush, sodium chloride flush    Assessment/Plan    1. Acute systolic HF -> Cardiogenic shock - post-op echo on 03/20/19 EF 25-30% (pre-op 30-35%. In 12/2017 EF normal) - Remains on Impella support. Co-ox down to 59%. UOP marginal  despite max dose lasix gtt and SCr rising >5. Needs inotrope, milrinone vs dobutamine. With borderline hypotension + elevated SCr, dobutamine may be more suitable, but would need to use caution given atrial fibrillation.   - Remains markedly volume overloaded, 30 lb up.  - Continue lasix gtt 30 mg/hr.  - Hold metolazone given rise in SCr - Add inotrope to help push diuresis.   -If no improvement, may need CVVHD for volume removal.  - No b,block, ACE/ARB, spiro or dig with shock and renal failure currently  2. CAD s/p CABG this admit (LIMA->LAD, SVG->OM, SVG->RCA on 03/15/19) - No s/s angina - Continue ASA/statin  3. Acute hypoxic respiratory failure - s/p trach on 03/27/19 - CCM managing  4. PAF - s/p MAZE and LAA occlusion - back in afib rates 90s-110s  - continue IV amio  5. AKI  - due to shock/ATN - creatinine 0.90-> 3.7->4.2->4.4->5.04 - hopefully will improve with hemodynamic support + addition of inotrope  - may need CVVHD - monitor closely.    Length of Stay: [address] PA-C  03/29/2019, 7:45 AM  Advanced Heart Failure Team Pager [phone] (M-F; 7a - 4p)  Please contact [facility] for night-coverage after hours (4p -7a ) and weekends on [email or web address]  Agree with above. Remains very tenuous despite Impella support. Urine output only modest. BUN and Creatinine climbing. Weight up 30 pounds. Now back in AF despite IV amio.   General:  Elderly weak appearing. No resp difficulty HEENT: normal Neck: supple. + trach  JVP to jaw Carotids 2+ bilat; no bruits. No lymphadenopathy or thryomegaly appreciated. Cor: Impella site ok. Sternal dressing ok PMI nondisplaced. Irregular rate & rhythm. No rubs, gallops or murmurs. Lungs: clear Abdomen: soft, nontender, nondistended. No hepatosplenomegaly. No bruits or masses. Good bowel sounds. Extremities: no cyanosis, clubbing, rash, 3+ edema Neuro: alert & orientedx3, cranial nerves grossly intact. moves all 4  extremities w/o difficulty. Affect pleasant  He is worse today despite Impella support. I d/w Dr. Prescott Gum. Will need CVVHD. I discussed with Dr. Posey Pronto in Renal who will see him today. Dr Prescott Gum will place catheter. Continue IV amio for AF. Waveforms stable on Impella.   CRITICAL CARE Performed by: Glori Bickers  Total critical care time: 35 minutes  Critical care time was exclusive of separately billable procedures and treating other patients.  Critical care was necessary to treat or prevent imminent or life-threatening deterioration.  Critical care was time spent personally by me (independent of midlevel providers or residents) on the following activities: development of treatment plan with patient and/or surrogate as well as nursing, discussions with consultants, evaluation of patient's response to treatment, examination of patient, obtaining history from patient or surrogate, ordering and performing treatments and interventions, ordering and review of laboratory studies, ordering and review of radiographic studies, pulse oximetry and re-evaluation of patient's condition.  Glori Bickers, MD  10:22 AM

## 2019-03-29 NOTE — Progress Notes (Signed)
Patient ID: Jesse Murphy, male   DOB: 03/26/1928, 86 y.o.   MRN: [id number] TCTS Evening Rounds:  Hemodynamically stable in sinus rhythm on epi 2, Impella P6 with flow 4 L/min PA 48/22, CVP 13, CI 2.4  UO 20-30/hr.  CVVH started this afternoon, removing 100 cc/hr.  Sedated on vent but awake and looking around. Weaned for a few hrs this am before tiring.  BMET    Component Value Date/Time   NA 128 (L) 03/29/2019 1552   K 3.6 03/29/2019 1552   CL 91 (L) 03/29/2019 1552   CO2 20 (L) 03/29/2019 1541   GLUCOSE 171 (H) 03/29/2019 1552   BUN >130 (H) 03/29/2019 1552   CREATININE 4.80 (H) 03/29/2019 1552   CALCIUM 7.5 (L) 03/29/2019 1541   GFRNONAA 11 (L) 03/29/2019 1541   GFRAA 13 (L) 03/29/2019 1541   CBC    Component Value Date/Time   WBC 14.8 (H) 03/29/2019 0417   RBC 3.09 (L) 03/29/2019 0417   HGB 10.9 (L) 03/29/2019 1552   HCT 32.0 (L) 03/29/2019 1552   PLT 238 03/29/2019 0417   MCV 86.4 03/29/2019 0417   MCH 28.5 03/29/2019 0417   MCHC 33.0 03/29/2019 0417   RDW 15.9 (H) 03/29/2019 0417   LYMPHSABS 0.6 (L) 03/13/2019 1056   MONOABS 0.5 03/13/2019 1056   EOSABS 0.1 03/13/2019 1056   BASOSABS 0.0 03/13/2019 1056

## 2019-03-29 NOTE — Progress Notes (Signed)
NAME:  Jesse Murphy, MRN:  [id number], DOB:  08-04-1928, LOS: 78 ADMISSION DATE:  03/13/2019, CONSULTATION DATE:  03/20/2019 REFERRING MD:  Prescott Gum, CHIEF COMPLAINT: Dyspnea  HPI/course in hospital  Jesse Murphy is an 86 y.o. male with a h/o CAD, afib, s/p CABG and clipping of left atrial appendage on 1/29 and transferred to CVICU with shortness of breath on 2/1. Patient became hypoxic 2/2 pulm edema and needed to be intubated on 2/2. Following diuresis, patient was extubated 2/5. Reintubated again on 2/8 due to respiratory failure due to combination of poor secretion clearance and volume overload. 2/9-placement of Impella and PA catheter for hemodynamic management. 2/11 anticipated initiation of CRRT.  Past Medical History   Past Medical History:  Diagnosis Date  . Atrial fibrillation ([place])   . CAD (coronary artery disease)    a. s/p PCI in 1992 b. Coronary CT in 01/2018 showing extensive coronary calcification; FFR indeterminate --> medical management pursued at that time as patient not interested in repeat cath  . Cancer [facility])    prostate  . Dyspnea   . Hypertension      Past Surgical History:  Procedure Laterality Date  . CLIPPING OF ATRIAL APPENDAGE N/A 03/16/2019   Procedure: CLIPPING OF LEFT  ATRIAL APPENDAGE - USING ATRICLIP SIZE 40;  Surgeon: Ivin Poot, MD;  Location: [place];  Service: Open Heart Surgery;  Laterality: N/A;  . COLONOSCOPY N/A 08/25/2017   Procedure: COLONOSCOPY;  Surgeon: Rogene Houston, MD;  [facility];  Service: Endoscopy;  Laterality: N/A;  . CORONARY ARTERY BYPASS GRAFT N/A 03/16/2019   Procedure: CORONARY ARTERY BYPASS GRAFTING (CABG), ON PUMP, TIMES THREE, USING LEFT INTERNAL MAMMARY ARTERY, RIGHT GREATER SAPHENOUS VEIN HARVESTED ENDOSCOPICALLY;  Surgeon: Ivin Poot, MD;  Location: [place];  Service: Open Heart Surgery;  Laterality: N/A;  swan only  . HERNIA REPAIR    . MAZE N/A 03/16/2019   Procedure: MAZE;  Surgeon: Ivin Poot, MD;  Location: [place];  Service: Open Heart Surgery;  Laterality: N/A;  . PLACEMENT OF IMPELLA LEFT VENTRICULAR ASSIST DEVICE Right 03/27/2019   Procedure: Placement Of Impella Left Ventricular Assist Device using ABIOMED Impella 5.5 with SmartAssist Device.;  Surgeon: Wonda Olds, MD;  Location: [facility];  Service: Thoracic;  Laterality: Right;  . PROSTATE SURGERY    . RIGHT/LEFT HEART CATH AND CORONARY ANGIOGRAPHY N/A 03/15/2019   Procedure: RIGHT/LEFT HEART CATH AND CORONARY ANGIOGRAPHY;  Surgeon: Burnell Blanks, MD;  Location: [place] [facility];  Service: Cardiovascular;  Laterality: N/A;  . TEE WITHOUT CARDIOVERSION N/A 03/16/2019   Procedure: TRANSESOPHAGEAL ECHOCARDIOGRAM (TEE);  Surgeon: Prescott Gum, Collier Salina, MD;  Location: [place];  Service: Open Heart Surgery;  Laterality: N/A;  . TRACHEOSTOMY TUBE PLACEMENT N/A 03/27/2019   Procedure: TRACHEOSTOMY placed using [place]iley 8DCT Cuffed.;  Surgeon: Wonda Olds, MD;  Location: [facility];  Service: Thoracic;  Laterality: N/A;      Interim history/subjective:  Patient awake and alert on sedation.  Calm denies pain.  Generally weak.  Minimal urine output despite high-dose furosemide infusion.  Objective   Blood pressure 99/82, pulse 99, temperature (!) 96.8 F (36 C), resp. rate (!) 21, height 5\' 8"  (1.727 m), weight 104.3 kg, SpO2 96 %. PAP: (42-66)/(15-31) 52/29 CVP:  [8 mmHg-21 mmHg] 20 mmHg PCWP:  [20 mmHg-22 mmHg] 22 mmHg CO:  [5.1 L/min-6.8 L/min] 5.1 L/min CI:  [2.4 L/min/m2-3.2 L/min/m2] 2.4 L/min/m2  Vent Mode: PSV;CPAP FiO2 (%):  [  40 %] 40 % Set Rate:  [18 bmp] 18 bmp Vt Set:  [540 mL] 540 mL PEEP:  [5 cmH20] 5 cmH20 Pressure Support:  [10 cmH20] 10 cmH20 Plateau Pressure:  [19 cmH20-25 cmH20] 23 cmH20   Intake/Output Summary (Last 24 hours) at 03/29/2019 1038 Last data filed at 03/29/2019 1000 Gross per 24 hour  Intake 3657.16 ml  Output 1195 ml  Net 2462.16 ml   Filed Weights   03/27/19 0500 03/28/19 0500  03/29/19 0500  Weight: 100.5 kg 103.1 kg 104.3 kg    Examination: Physical Exam Constitutional:      Appearance: He is ill-appearing.     Comments: Pale intubated sedated.  Diffuse edema.  HENT:     Mouth/Throat:     Comments: Tracheostomy site intact.  Nasogastric tube in place. Eyes:     General: No scleral icterus.    Extraocular Movements: Extraocular movements intact.  Cardiovascular:     Rate and Rhythm: Tachycardia present. Rhythm irregular.     Heart sounds: Normal heart sounds.     Comments: Converted back to atrial fibrillation today. Chest:     Chest wall: No tenderness or crepitus.     Comments: Well-healed sternotomy incision. Musculoskeletal:     Right lower leg: Pitting Edema present.     Left lower leg: Pitting Edema present.  Neurological:     General: No focal deficit present.     Comments: Generalized weakness      Ancillary tests (personally reviewed)  CBC: Recent Labs  Lab 03/26/19 0626 03/26/19 0649 03/27/19 0514 03/27/19 1043 03/27/19 1255 03/27/19 1305 03/27/19 1621 03/28/19 0417 03/28/19 0432 03/29/19 0417 03/29/19 0425  WBC 14.6*  --  11.3*  --  15.0*  --   --  21.0*  --  14.8*  --   HGB 8.1*   < > 6.9*   < > 9.6*   < > 9.9* 9.7* 10.5* 8.8* 8.8*  HCT 26.4*   < > 22.0*   < > 30.1*   < > 29.0* 29.5* 31.0* 26.7* 26.0*  MCV 93.0  --  91.7  --  89.3  --   --  86.3  --  86.4  --   PLT 343  --  285  --  298  --   --  285  --  238  --    < > = values in this interval not displayed.    Basic Metabolic Panel: Recent Labs  Lab 03/22/19 1540 03/22/19 1718 03/26/19 0626 03/26/19 0649 03/26/19 1437 03/27/19 0514 03/27/19 1255 03/27/19 1305 03/27/19 1600 03/27/19 1621 03/28/19 0417 03/28/19 0432 03/28/19 1532 03/29/19 0417 03/29/19 0425  NA 140   < > 135   < > 136   < > 134*   < > 134*   < > 133* 132* 131* 130* 129*  K 3.7   < > 3.9   < > 4.1   < > 3.9   < > 3.8   < > 3.8 3.8 3.7 3.7 3.6  CL 98   < > 96*   < > 91*   < > 95*  --  95*   --  93*  --  91* 92*  --   CO2 26   < > 25   < > 25   < > 22  --  21*  --  21*  --  20* 20*  --   GLUCOSE 197*   < > 287*   < > 196*   < >  156*  --  164*  --  124*  --  180* 196*  --   BUN 64*   < > 135*   < > 149*   < > 167*  --  166*  --  168*  --  177* 191*  --   CREATININE 1.86*   < > 3.20*   < > 3.41*   < > 4.19*  --  4.15*  --  4.43*  --  4.61* 5.04*  --   CALCIUM 7.9*   < > 7.3*   < > 7.8*   < > 7.4*  --  7.4*  --  7.6*  --  7.5* 7.5*  --   MG  --   --  2.2  --   --   --   --   --   --   --   --   --   --   --   --   PHOS 2.8  --  6.8*  --  6.3*  --   --   --  7.9*  --   --   --  8.1*  --   --    < > = values in this interval not displayed.   GFR: Estimated Creatinine Clearance: 11.9 mL/min (A) (by C-G formula based on SCr of 5.04 mg/dL (H)). Recent Labs  Lab 03/26/19 0626 03/26/19 0626 03/26/19 1027 03/27/19 0514 03/27/19 1255 03/27/19 1305 03/27/19 1605 03/28/19 0417 03/29/19 0417  WBC 14.6*   < >  --  11.3* 15.0*  --   --  21.0* 14.8*  LATICACIDVEN 0.9  --  1.0  --   --  1.0 0.9  --   --    < > = values in this interval not displayed.    Liver Function Tests: Recent Labs  Lab 03/26/19 0626 03/26/19 1437 03/27/19 0514 03/27/19 0514 03/27/19 1255 03/27/19 1600 03/28/19 0417 03/28/19 1532 03/29/19 0417  AST 137*  --  94*  --  85*  --  73*  --  52*  ALT 106*  --  83*  --  81*  --  63*  --  32  ALKPHOS 63  --  50  --  55  --  67  --  57  BILITOT 0.5  --  0.7  --  0.5  --  1.0  --  0.6  PROT 5.6*  --  5.3*  --  5.8*  --  5.6*  --  5.5*  ALBUMIN 2.2*   < > 2.5*   < > 2.6* 2.6* 2.4* 2.2* 2.1*   < > = values in this interval not displayed.   No results for input(s): LIPASE, AMYLASE in the last 168 hours. No results for input(s): AMMONIA in the last 168 hours.  ABG    Component Value Date/Time   PHART 7.387 03/29/2019 0425   PCO2ART 36.2 03/29/2019 0425   PO2ART 72.0 (L) 03/29/2019 0425   HCO3 21.9 03/29/2019 0425   TCO2 23 03/29/2019 0425   ACIDBASEDEF 3.0  (H) 03/29/2019 0425   O2SAT 95.0 03/29/2019 0425     Coagulation Profile: Recent Labs  Lab 03/27/19 1255  INR 1.7*    Cardiac Enzymes: No results for input(s): CKTOTAL, CKMB, CKMBINDEX, TROPONINI in the last 168 hours.  HbA1C: Hgb A1c MFr Bld  Date/Time Value Ref Range Status  03/13/2019 12:51 PM 6.1 (H) 4.8 - 5.6 % Final    Comment:    (NOTE) Pre diabetes:  5.7%-6.4% Diabetes:              >6.4% Glycemic control for   <7.0% adults with diabetes   09/12/2018 09:36 AM 5.9 (H) 4.8 - 5.6 % Final    Comment:    (NOTE) Pre diabetes:          5.7%-6.4% Diabetes:              >6.4% Glycemic control for   <7.0% adults with diabetes     CBG: Recent Labs  Lab 03/28/19 1537 03/28/19 1945 03/29/19 0006 03/29/19 0422 03/29/19 0749  GLUCAP 173* 172* 208* 184* 170*   Chest x-ray 2/11: Remains unchanged with predominantly mild interstitial changes.  Background pleural effusions.  (Personally reviewed)  Assessment & Plan:  Critically ill due to Hypercarbic Hypoxic Respiratory Failure 2/2 cardiogenic pulmonary edema and generalized weakness requiring mechanical ventilation.  Re-intubated 2/8.  Remains total body water overloaded, and is receiving a large amount of fluids.  - continue full ventilatory support.   Remains critically ill due to cardiogenic shock requiring mechanical cardiac support and titration of inotropes and vasopressors secondary to HFrEF. Hemodynamics have improved with current level of support.   Critically ill due to atrial fibrillation with rapid ventricular response requiring titration of amiodarone, Patient has converted to NSR. Significant improvement in hemodynamics with AV synchrony.  - re-bolus with amiodarone for recurrent atrial fibrillation.  AKI has worsened despite aggressive diuresis. Marked volume overload. no improvement in creatinine with diuretic holiday. Suspect hypoperfusion from poor cardiac function(cardiorenal syndrome) -  furosemide infusion and zaroxylyn. -Transition to CRRT today  Hemodilutional anemia/ anemia of chronic illness. No signs of bleeding. - transfusion today.  Leukocytosis despite improving hemodynamics. Sputum positive for Pseudomonas. Copious secretions in airway when trach placed.  - on ceftazidime  Daily Goals Checklist  Pain/Anxiety/Delirium protocol (if indicated): Precedex infusion.  Respiratory support goals: Full ventilatory support. Currently on minimal settings. Blood pressure target: Off norepinephrine.  Continue epinephrine infusion to maintain cardiac output.  Continue Impella support at current level.   Prophylaxis: heparin in purge fluid, IV heparin infusion. Nutrition Status: Nutrition Problem: Increased nutrient needs Etiology: post-op healing Signs/Symptoms: estimated needs Interventions: Tube feeding, Prostat restart trickle feeds  GI prophylaxis: PPI Fluid status goals: Initiate CRRT today Urinary catheter: Required for retention and edema. Central lines: Continue current central lines until off vasoactive agents Glucose control: We will increase tube feed coverage. Mobility/therapy needs: In chair position in bed. Antibiotic de-escalation:  Seven more days for pseudomonas.  Home medication reconciliation: on hold while intubated. Daily labs: CBC, CMP  Code Status: Full code Family Communication: Updated by cardiac surgery Disposition: ICU until infusions discontinued.   CRITICAL CARE Performed by: Kipp Brood   Total critical care time: 40 minutes  Critical care time was exclusive of separately billable procedures and treating other patients.  Critical care was necessary to treat or prevent imminent or life-threatening deterioration.  Critical care was time spent personally by me on the following activities: development of treatment plan with patient and/or surrogate as well as nursing, discussions with consultants, evaluation of patient's response  to treatment, examination of patient, obtaining history from patient or surrogate, ordering and performing treatments and interventions, ordering and review of laboratory studies, ordering and review of radiographic studies, pulse oximetry, re-evaluation of patient's condition and participation in multidisciplinary rounds.  Kipp Brood, MD [facility] ICU Physician [place]  Pager: [phone] Mobile: [phone] After hours: [phone].     03/29/2019, 10:38 AM

## 2019-03-29 NOTE — Progress Notes (Signed)
ANTICOAGULATION CONSULT NOTE   Pharmacy Consult for anticoagulation management/assistance  Indication: Impella 5.5  No Known Allergies  Patient Measurements: Height: 5\' 8"  (172.7 cm) Weight: 229 lb 15 oz (104.3 kg) IBW/kg (Calculated) : 68.4 Heparin Dosing Weight: 86kg  Vital Signs: Temp: 96.8 F (36 C) (02/11 1000) Temp Source: Core (02/11 0400) BP: 99/82 (02/11 1000) Pulse Rate: 99 (02/11 1000)  Labs: Recent Labs    03/27/19 1255 03/27/19 1305 03/28/19 0417 03/28/19 0417 03/28/19 0432 03/28/19 0432 03/28/19 1532 03/29/19 0417 03/29/19 0425 03/29/19 0942  HGB 9.6*   < > 9.7*   < > 10.5*   < >  --  8.8* 8.8*  --   HCT 30.1*   < > 29.5*   < > 31.0*  --   --  26.7* 26.0*  --   PLT 298  --  285  --   --   --   --  238  --   --   APTT  --   --  43*   < >  --   --  59* 176*  --  126*  LABPROT 19.5*  --   --   --   --   --   --   --   --   --   INR 1.7*  --   --   --   --   --   --   --   --   --   HEPARINUNFRC  --   --   --   --   --   --  <0.10* 0.34  --   --   CREATININE 4.19*   < > 4.43*  --   --   --  4.61* 5.04*  --   --    < > = values in this interval not displayed.    Estimated Creatinine Clearance: 11.9 mL/min (A) (by C-G formula based on SCr of 5.04 mg/dL (H)).   Medical History: Past Medical History:  Diagnosis Date  . Atrial fibrillation ([place])   . CAD (coronary artery disease)    a. s/p PCI in 1992 b. Coronary CT in 01/2018 showing extensive coronary calcification; FFR indeterminate --> medical management pursued at that time as patient not interested in repeat cath  . Cancer [facility])    prostate  . Dyspnea   . Hypertension     Assessment: 86 year old male s/p CABG 1/29 with MAZE and LAA. Patient taken to OR 2/9 for tracheostomy and impella 5.5 placement.   Heparin infusing at 800 units/h, purge is half/strength (25 units/ml) infusing ~340 units/h. H/H down slightly, LDH stable. APTT elevated at 126 seconds but could be related to liver dysfunction  and volume status. Heparin paused for line placement this morning. Discussed with Dr. Prescott Gum - ok to restart now and keep heparin level ~0.3, will stop checking aPTT for now.  Goal of Therapy:  Monitor platelets by anticoagulation protocol: Yes   Plan:  -Continue heparin via purge (1/2 strength) + 800 units/h peripherally -Repeat heparin level tonight - try to keep ~0.3 and will not increase peripheral heparin over 1000 units/h (~10 units/kg)   Arrie Senate, PharmD, BCPS Clinical Pharmacist [phone] Please check AMION for all [place] numbers 03/29/2019

## 2019-03-30 ENCOUNTER — Inpatient Hospital Stay (HOSPITAL_COMMUNITY): Payer: Medicare Other

## 2019-03-30 LAB — COMPREHENSIVE METABOLIC PANEL
ALT: 24 U/L (ref 0–44)
AST: 47 U/L — ABNORMAL HIGH (ref 15–41)
Albumin: 2 g/dL — ABNORMAL LOW (ref 3.5–5.0)
Alkaline Phosphatase: 62 U/L (ref 38–126)
Anion gap: 14 (ref 5–15)
BUN: 130 mg/dL — ABNORMAL HIGH (ref 8–23)
CO2: 22 mmol/L (ref 22–32)
Calcium: 7.5 mg/dL — ABNORMAL LOW (ref 8.9–10.3)
Chloride: 96 mmol/L — ABNORMAL LOW (ref 98–111)
Creatinine, Ser: 3.47 mg/dL — ABNORMAL HIGH (ref 0.61–1.24)
GFR calc Af Amer: 17 mL/min — ABNORMAL LOW (ref >60)
GFR calc non Af Amer: 15 mL/min — ABNORMAL LOW (ref >60)
Glucose, Bld: 163 mg/dL — ABNORMAL HIGH (ref 70–99)
Potassium: 3.6 mmol/L (ref 3.5–5.1)
Sodium: 132 mmol/L — ABNORMAL LOW (ref 135–145)
Total Bilirubin: 0.6 mg/dL (ref 0.3–1.2)
Total Protein: 5.4 g/dL — ABNORMAL LOW (ref 6.5–8.1)

## 2019-03-30 LAB — BPAM RBC
Blood Product Expiration Date: 202103092359
Blood Product Expiration Date: 202103092359
Blood Product Expiration Date: 202103102359
Blood Product Expiration Date: 202103102359
Blood Product Expiration Date: 202103102359
Blood Product Expiration Date: 202103132359
Blood Product Expiration Date: 202103142359
ISSUE DATE / TIME: 202102090702
ISSUE DATE / TIME: 202102091046
ISSUE DATE / TIME: 202102091046
ISSUE DATE / TIME: 202102091046
ISSUE DATE / TIME: 202102091046
Unit Type and Rh: 7300
Unit Type and Rh: 7300
Unit Type and Rh: 7300
Unit Type and Rh: 7300
Unit Type and Rh: 7300
Unit Type and Rh: 7300
Unit Type and Rh: 7300

## 2019-03-30 LAB — MAGNESIUM: Magnesium: 2.4 mg/dL (ref 1.7–2.4)

## 2019-03-30 LAB — TYPE AND SCREEN
ABO/RH(D): B POS
Antibody Screen: NEGATIVE
Unit division: 0
Unit division: 0
Unit division: 0
Unit division: 0
Unit division: 0
Unit division: 0
Unit division: 0

## 2019-03-30 LAB — RENAL FUNCTION PANEL
Albumin: 2 g/dL — ABNORMAL LOW (ref 3.5–5.0)
Anion gap: 12 (ref 5–15)
BUN: 97 mg/dL — ABNORMAL HIGH (ref 8–23)
CO2: 23 mmol/L (ref 22–32)
Calcium: 7.4 mg/dL — ABNORMAL LOW (ref 8.9–10.3)
Chloride: 97 mmol/L — ABNORMAL LOW (ref 98–111)
Creatinine, Ser: 2.73 mg/dL — ABNORMAL HIGH (ref 0.61–1.24)
GFR calc Af Amer: 23 mL/min — ABNORMAL LOW (ref >60)
GFR calc non Af Amer: 20 mL/min — ABNORMAL LOW (ref >60)
Glucose, Bld: 151 mg/dL — ABNORMAL HIGH (ref 70–99)
Phosphorus: 4.6 mg/dL (ref 2.5–4.6)
Potassium: 4.1 mmol/L (ref 3.5–5.1)
Sodium: 132 mmol/L — ABNORMAL LOW (ref 135–145)

## 2019-03-30 LAB — HEPARIN LEVEL (UNFRACTIONATED)
Heparin Unfractionated: 0.34 IU/mL (ref 0.30–0.70)
Heparin Unfractionated: 0.34 IU/mL (ref 0.30–0.70)

## 2019-03-30 LAB — GLUCOSE, CAPILLARY
Glucose-Capillary: 157 mg/dL — ABNORMAL HIGH (ref 70–99)
Glucose-Capillary: 156 mg/dL — ABNORMAL HIGH (ref 70–99)
Glucose-Capillary: 164 mg/dL — ABNORMAL HIGH (ref 70–99)
Glucose-Capillary: 143 mg/dL — ABNORMAL HIGH (ref 70–99)
Glucose-Capillary: 135 mg/dL — ABNORMAL HIGH (ref 70–99)
Glucose-Capillary: 158 mg/dL — ABNORMAL HIGH (ref 70–99)

## 2019-03-30 LAB — POCT I-STAT 7, (LYTES, BLD GAS, ICA,H+H)
Acid-base deficit: 2 mmol/L (ref 0.0–2.0)
Bicarbonate: 22.3 mmol/L (ref 20.0–28.0)
Calcium, Ion: 1.01 mmol/L — ABNORMAL LOW (ref 1.15–1.40)
HCT: 23 % — ABNORMAL LOW (ref 39.0–52.0)
Hemoglobin: 7.8 g/dL — ABNORMAL LOW (ref 13.0–17.0)
O2 Saturation: 96 %
Patient temperature: 36.6
Potassium: 3.2 mmol/L — ABNORMAL LOW (ref 3.5–5.1)
Sodium: 134 mmol/L — ABNORMAL LOW (ref 135–145)
TCO2: 23 mmol/L (ref 22–32)
pCO2 arterial: 33.2 mmHg (ref 32.0–48.0)
pH, Arterial: 7.434 (ref 7.350–7.450)
pO2, Arterial: 76 mmHg — ABNORMAL LOW (ref 83.0–108.0)

## 2019-03-30 LAB — CBC
HCT: 26 % — ABNORMAL LOW (ref 39.0–52.0)
HCT: 26.3 % — ABNORMAL LOW (ref 39.0–52.0)
Hemoglobin: 8.6 g/dL — ABNORMAL LOW (ref 13.0–17.0)
Hemoglobin: 8.6 g/dL — ABNORMAL LOW (ref 13.0–17.0)
MCH: 28.4 pg (ref 26.0–34.0)
MCH: 28.5 pg (ref 26.0–34.0)
MCHC: 33.1 g/dL (ref 30.0–36.0)
MCHC: 32.7 g/dL (ref 30.0–36.0)
MCV: 85.8 fL (ref 80.0–100.0)
MCV: 87.1 fL (ref 80.0–100.0)
Platelets: 230 10*3/uL (ref 150–400)
Platelets: 256 10*3/uL (ref 150–400)
RBC: 3.03 MIL/uL — ABNORMAL LOW (ref 4.22–5.81)
RBC: 3.02 MIL/uL — ABNORMAL LOW (ref 4.22–5.81)
RDW: 15.9 % — ABNORMAL HIGH (ref 11.5–15.5)
RDW: 16 % — ABNORMAL HIGH (ref 11.5–15.5)
WBC: 14.4 10*3/uL — ABNORMAL HIGH (ref 4.0–10.5)
WBC: 16.6 10*3/uL — ABNORMAL HIGH (ref 4.0–10.5)
nRBC: 0 % (ref 0.0–0.2)
nRBC: 0 % (ref 0.0–0.2)

## 2019-03-30 LAB — COOXEMETRY PANEL
Carboxyhemoglobin: 1.6 % — ABNORMAL HIGH (ref 0.5–1.5)
Methemoglobin: 1.2 % (ref 0.0–1.5)
O2 Saturation: 69.4 %
Total hemoglobin: 9.2 g/dL — ABNORMAL LOW (ref 12.0–16.0)

## 2019-03-30 LAB — LACTATE DEHYDROGENASE: LDH: 285 U/L — ABNORMAL HIGH (ref 98–192)

## 2019-03-30 MED ORDER — METOCLOPRAMIDE HCL 5 MG/ML IJ SOLN
INTRAMUSCULAR | Status: AC
  Filled 2019-03-30: qty 2

## 2019-03-30 MED ORDER — VITAL 1.5 CAL PO LIQD
1000.0000 mL | ORAL | Status: DC
  Administered 2019-03-30 – 2019-03-31 (×2): 1000 mL
  Filled 2019-03-30 (×3): qty 1000

## 2019-03-30 MED ORDER — B COMPLEX-C PO TABS
1.0000 | ORAL_TABLET | Freq: Every day | ORAL | Status: DC
  Administered 2019-03-30 – 2019-04-12 (×13): 1 via ORAL
  Filled 2019-03-30 (×14): qty 1

## 2019-03-30 MED ORDER — BETHANECHOL CHLORIDE 5 MG PO TABS
5.0000 mg | ORAL_TABLET | Freq: Three times a day (TID) | ORAL | Status: DC
  Administered 2019-03-30: 5 mg
  Filled 2019-03-30 (×2): qty 1

## 2019-03-30 MED ORDER — POTASSIUM CHLORIDE 10 MEQ/50ML IV SOLN
10.0000 meq | Freq: Once | INTRAVENOUS | Status: AC
  Administered 2019-03-30: 10 meq via INTRAVENOUS
  Filled 2019-03-30: qty 50

## 2019-03-30 MED ORDER — AMIODARONE LOAD VIA INFUSION
150.0000 mg | Freq: Once | INTRAVENOUS | Status: AC
  Administered 2019-03-30: 150 mg via INTRAVENOUS

## 2019-03-30 MED ORDER — CHLORHEXIDINE GLUCONATE 0.12 % MT SOLN
OROMUCOSAL | Status: AC
  Filled 2019-03-30: qty 15

## 2019-03-30 MED ORDER — METOCLOPRAMIDE HCL 5 MG/ML IJ SOLN
5.0000 mg | Freq: Once | INTRAMUSCULAR | Status: AC
  Administered 2019-03-30: 5 mg via INTRAVENOUS

## 2019-03-30 MED ORDER — PANTOPRAZOLE SODIUM 40 MG PO PACK
40.0000 mg | PACK | ORAL | Status: DC
  Administered 2019-03-30 – 2019-04-01 (×3): 40 mg
  Filled 2019-03-30 (×3): qty 20

## 2019-03-30 MED ORDER — PRO-STAT SUGAR FREE PO LIQD
60.0000 mL | Freq: Three times a day (TID) | ORAL | Status: DC
  Administered 2019-03-30 – 2019-04-04 (×13): 60 mL
  Filled 2019-03-30 (×13): qty 60

## 2019-03-30 NOTE — Progress Notes (Signed)
NAME:  Jesse Murphy, MRN:  [id number], DOB:  08-21-1928, LOS: 33 ADMISSION DATE:  03/13/2019, CONSULTATION DATE:  03/20/2019 REFERRING MD:  Dr. Prescott Gum, TCTS, CHIEF COMPLAINT:  Short of breath   Brief History   86 yo male former smoker with CAD and A fib had CABG and Lt atrial appendage clipping on 1/29.  He was transferred to ICU on 2/01 with dyspnea requiring intubation.    Past Medical History  A fib, CAD, Prostate cancer, HTN  Significant Hospital Events   1/29 CABG 2/01 to ICU 2/05 Extubated 2/08 Reintubated 2/09 tracheostomy 2/09 Impella, PA catheter placed 2/11 Start CRRT  Consults:  Nephrology CHF team  Procedures:  Rt PICC 2/02 >> Trach 2/09 >> PA catheter 2/09 >> Lt [place] HD cath 2/11 >>   Significant Diagnostic Tests:  Echo 1/26 >> EF 30 to 35%  Micro Data:  SARS CoV2 PCR 1/26 >> negative Influenza PCR 1/26 >> negative Sputum 2/01 >> oral flora Sputum 2/08 >> Pseudomonas  Antimicrobials:  Rocephin 2/01 >> 2/07 Cefepime 2/08 >> 2/10 Tressie Ellis 2/10 >>   Interim history/subjective:  Remains on pressors, CRRT, vent, impella.  Objective   Blood pressure 109/65, pulse 77, temperature 98.6 F (37 C), resp. rate (!) 30, height 5\' 8"  (1.727 m), weight 105.7 kg, SpO2 93 %. PAP: (43-61)/(18-29) 45/18 CVP:  [11 mmHg-20 mmHg] 13 mmHg CO:  [5.1 L/min-6.5 L/min] 5.6 L/min CI:  [2.4 L/min/m2-3.1 L/min/m2] 2.6 L/min/m2  Vent Mode: PRVC FiO2 (%):  [40 %] 40 % Set Rate:  [18 bmp] 18 bmp Vt Set:  [540 mL] 540 mL PEEP:  [5 cmH20] 5 cmH20 Plateau Pressure:  [17 cmH20-27 cmH20] 24 cmH20   Intake/Output Summary (Last 24 hours) at 03/30/2019 0803 Last data filed at 03/30/2019 0715 Gross per 24 hour  Intake 3339.18 ml  Output 3956 ml  Net -616.82 ml   Filed Weights   03/28/19 0500 03/29/19 0500 03/30/19 0400  Weight: 103.1 kg 104.3 kg 105.7 kg    Examination:  General - on vent Eyes - pupils reactive ENT - trach site clean Cardiac - irregular Chest - basilar  rales Abdomen - soft, non tender, + bowel sounds Extremities - 2+ edema Skin - pressure wounds Neuro - RASS 0   Resolved Hospital Problem list     Assessment & Plan:   Acute hypoxic respiratory failure 2nd to acute pulmonary edema complicated by HCAP. Failure to wean s/p tracheostomy. - full vent support - f/u CXR, ABG  Pseudomonal HCAP. - day 5 of ABx, currently on fortaz  Cardiogenic shock with acute systolic CHF. CAD s/p CABG. PAF s/p MAZE. Hx of HLD. - impella, pressors per CHF team and TCTS - continue ASA, lipitor, amiodarone, heparin gtt  AKI from ATN 2nd to cardiogenic shock. - CRRT  Anemia of critical illness. - f/u CBC - transfuse for Hb < 7 or significant bleeding  DM type II poorly controlled. - SSI with lantus  Acute metabolic encephalopathy 2nd to shock, renal failure, hypoxia. - RASS goal 0 to -1  Pressure injuries. - Stage 1 mid-upper vertebral column, not present on admission  Best practice:  Diet: Tube feeds DVT prophylaxis: Heparin gtt GI prophylaxis: protonix Mobility: bed rest Code Status: full code Disposition: ICU  Labs    CMP Latest Ref Rng & Units 03/30/2019 03/30/2019 03/29/2019  Glucose 70 - 99 mg/dL - 163(H) 171(H)  BUN 8 - 23 mg/dL - 130(H) >130(H)  Creatinine 0.61 - 1.24 mg/dL - 3.47(H) 4.80(H)  Sodium 135 - 145 mmol/L 134(L) 132(L) 128(L)  Potassium 3.5 - 5.1 mmol/L 3.2(L) 3.6 3.6  Chloride 98 - 111 mmol/L - 96(L) 91(L)  CO2 22 - 32 mmol/L - 22 -  Calcium 8.9 - 10.3 mg/dL - 7.5(L) -  Total Protein 6.5 - 8.1 g/dL - 5.4(L) -  Total Bilirubin 0.3 - 1.2 mg/dL - 0.6 -  Alkaline Phos 38 - 126 U/L - 62 -  AST 15 - 41 U/L - 47(H) -  ALT 0 - 44 U/L - 24 -    CBC Latest Ref Rng & Units 03/30/2019 03/30/2019 03/29/2019  WBC 4.0 - 10.5 K/uL - 14.4(H) -  Hemoglobin 13.0 - 17.0 g/dL 7.8(L) 8.6(L) 10.9(L)  Hematocrit 39.0 - 52.0 % 23.0(L) 26.0(L) 32.0(L)  Platelets 150 - 400 K/uL - 230 -    ABG    Component Value Date/Time    PHART 7.434 03/30/2019 0546   PCO2ART 33.2 03/30/2019 0546   PO2ART 76.0 (L) 03/30/2019 0546   HCO3 22.3 03/30/2019 0546   TCO2 23 03/30/2019 0546   ACIDBASEDEF 2.0 03/30/2019 0546   O2SAT 96.0 03/30/2019 0546    CBG (last 3)  Recent Labs    03/29/19 2037 03/29/19 2329 03/30/19 0411  GLUCAP 140* 152* 157*    CC time 32 minutes  Chesley Mires, MD [facility] Pulmonary/Critical Care 03/30/2019, 8:18 AM

## 2019-03-30 NOTE — Progress Notes (Signed)
[place] for anticoagulation management/assistance  Indication: Impella 5.5  No Known Allergies  Patient Measurements: Height: 5\' 8"  (172.7 cm) Weight: 233 lb 0.4 oz (105.7 kg) IBW/kg (Calculated) : 68.4 Heparin Dosing Weight: 86kg  Vital Signs: Temp: 97.9 F (36.6 C) (02/12 0600) Temp Source: Core (02/12 0400) BP: 99/66 (02/12 0600) Pulse Rate: 74 (02/12 0600)  Labs: Recent Labs    03/27/19 1255 03/27/19 1305 03/28/19 0417 03/28/19 0432 03/28/19 1532 03/28/19 1532 03/29/19 0417 03/29/19 0417 03/29/19 0425 03/29/19 0942 03/29/19 1541 03/29/19 1552 03/29/19 1552 03/29/19 1735 03/30/19 0255 03/30/19 0546  HGB 9.6*   < > 9.7*   < >  --   --  8.8*  --    < >  --   --  10.9*   < >  --  8.6* 7.8*  HCT 30.1*   < > 29.5*   < >  --   --  26.7*  --    < >  --   --  32.0*  --   --  26.0* 23.0*  PLT 298   < > 285  --   --   --  238  --   --   --   --   --   --   --  230  --   APTT  --   --  43*   < > 59*  --  176*  --   --  126*  --   --   --   --   --   --   LABPROT 19.5*  --   --   --   --   --   --   --   --   --   --   --   --   --   --   --   INR 1.7*  --   --   --   --   --   --   --   --   --   --   --   --   --   --   --   HEPARINUNFRC  --   --   --   --  <0.10*   < > 0.34  --   --   --   --   --   --  0.38 0.34  --   CREATININE 4.19*   < > 4.43*   < > 4.61*   < > 5.04*   < >  --   --  4.43* 4.80*  --   --  3.47*  --    < > = values in this interval not displayed.    Estimated Creatinine Clearance: 17.3 mL/min (A) (by C-G formula based on SCr of 3.47 mg/dL (H)).   Medical History: Past Medical History:  Diagnosis Date  . Atrial fibrillation ([place])   . CAD (coronary artery disease)    a. s/p PCI in 1992 b. Coronary CT in 01/2018 showing extensive coronary calcification; FFR indeterminate --> medical management pursued at that time as patient not interested in repeat cath  . Cancer [facility])    prostate  . Dyspnea   .  Hypertension     Assessment: 86 year old male s/p CABG 1/29 with MAZE and LAA. Patient taken to OR 2/9 for tracheostomy and impella 5.5 placement.   Heparin level today is at goal at 0.34, LDH and CBC stable with heparin infusing at 750 units/h,  purge is half/strength (25 units/ml) infusing ~340 units/h. Discussed with Dr. Prescott Gum - ok to keep heparin level ~0.3. If systemic heparin >1000 units/h (10 units/kg) needed, will need to discuss with MD.  Goal of Therapy:  Monitor platelets by anticoagulation protocol: Yes   Plan:  -Continue heparin via purge (1/2 strength) + 750 units/h peripherally -Check heparin level q12h at 5a/5p -Could consider increasing heparin purge concentration and reducing peripheral rate    Arrie Senate, PharmD, BCPS Clinical Pharmacist [phone] Please check AMION for all [place] numbers 03/30/2019

## 2019-03-30 NOTE — Procedures (Addendum)
[name]  Tube Type:  [name] - 43 inches Tube Location:  Right nare Initial Placement:  Postpyloric Secured by: Bridle Technique Used to Measure Tube Placement:  Documented cm marking at nare/ corner of mouth [name] Secured At:  100 cm    [name] Tube Team Note:  Consult received to place a [name] feeding tube.   X-ray is required, abdominal x-ray has been ordered by the [name] team. Please confirm tube placement before using the [name] tube.   If the tube becomes dislodged please keep the tube and contact the [name] team at [email or web address] (password [email or web address]) for replacement.  If after hours and replacement cannot be delayed, place a NG tube and confirm placement with an abdominal x-ray.    Koleen Distance MS, RD, LDN Contact information available in Amion

## 2019-03-30 NOTE — Progress Notes (Signed)
Patient had an episode of vomiting. Tube feeds stopped. Zofran given. Airway suctioned.   RN will continue to monitor.  Dietician aware. Will hold off feeding today. [name] is not post-pyloric according to X-ray.

## 2019-03-30 NOTE — Progress Notes (Signed)
Patient ID: AMAREE LEEPER, male   DOB: 1928/03/22, 86 y.o.   MRN: [id number]  [facility] Progress Note   Assessment/ Plan:   1.  Acute kidney injury: Progressive acute kidney injury during this hospitalization compounded by postoperative CHF/cardiogenic shock that was refractory to diuretics.  Patient started on CRRT yesterday for extracorporeal volume removal. 2.  Acute systolic congestive heart failure with cardiogenic shock: Left ventricular assist device/Impella support for postoperative CHF, continue current CRRT prescription for UF. 3.  Acute hypoxic respiratory failure: Primarily volume mediated, continue ultrafiltration with CRRT to assist with weaning. 4.  Coronary artery disease status post three-vessel CABG 5.  Paroxysmal atrial fibrillation: Status post Maze procedure and clipping of atrial appendage, on intravenous amiodarone and currently back in sinus rhythm. 6.  Hyponatremia: Secondary to CHF exacerbation, monitor with CRRT/UF  Subjective:   Without acute events overnight, CVP this morning 13.   Objective:   BP 109/65   Pulse 77   Temp 98.6 F (37 C)   Resp (!) 30   Ht '5\' 8"'  (1.727 m)   Wt 105.7 kg   SpO2 93%   BMI 35.43 kg/m   Intake/Output Summary (Last 24 hours) at 03/30/2019 0717 Last data filed at 03/30/2019 0700 Gross per 24 hour  Intake 3383.06 ml  Output 3801 ml  Net -417.94 ml   Weight change: 1.4 kg  Physical Exam: Gen: On ventilator via tracheostomy, awake CVS: Pulse regular rhythm, muffled heart sounds Resp: Anteriorly clear to auscultation, no distinct rales.  Dressing over sternotomy site Abd: Soft, obese, nontender Ext: 1-2+ lower extremity edema, 1+ dependent edema  Imaging: DG Chest 1 View  Result Date: 03/29/2019 CLINICAL DATA:  Follow-up Impella device EXAM: CHEST  1 VIEW COMPARISON:  03/29/2019 FINDINGS: Tracheostomy tube, gastric catheter and right-sided PICC line are again noted and stable in satisfactory position.  Impella and Swan-Ganz catheter is well as postoperative changes are again seen and stable. Vascular congestion is again noted as well as bilateral effusions relatively stable from the prior exam. No new focal infiltrate is seen. IMPRESSION: Tubes and lines as described above stable from the prior exam. Stable vascular congestion and pleural effusions. Electronically Signed   By: Inez Catalina M.D.   On: 03/29/2019 12:35   DG Chest Port 1 View  Result Date: 03/29/2019 CLINICAL DATA:  Check Impella placement EXAM: PORTABLE CHEST 1 VIEW COMPARISON:  03/27/2018 FINDINGS: Cardiac shadow is enlarged but stable. Postsurgical changes are again noted. Impella device is well as a Swan-Ganz catheter are again seen and stable. Tracheostomy tube, gastric catheter and right-sided PICC line are noted in satisfactory position. Persistent bilateral pleural effusions are noted left slightly greater than right layering posteriorly. Vascular congestion remains. No new focal infiltrate is seen. IMPRESSION: Overall stable appearance of the chest Tubes and lines stable in appearance. Electronically Signed   By: Inez Catalina M.D.   On: 03/29/2019 09:05    Labs: BMET Recent Labs  Lab 03/26/19 0626 03/26/19 0649 03/26/19 1437 03/27/19 0514 03/27/19 1255 03/27/19 1305 03/27/19 1600 03/27/19 1621 03/28/19 0417 03/28/19 0432 03/28/19 1532 03/29/19 0417 03/29/19 0425 03/29/19 1541 03/29/19 1552 03/30/19 0255 03/30/19 0546  NA 135   < > 136   < > 134*   < > 134*   < > 133*   < > 131* 130* 129* 131* 128* 132* 134*  K 3.9   < > 4.1   < > 3.9   < > 3.8   < > 3.8   < >  3.7 3.7 3.6 3.7 3.6 3.6 3.2*  CL 96*   < > 91*   < > 95*   < > 95*  --  93*  --  91* 92*  --  92* 91* 96*  --   CO2 25   < > 25   < > 22  --  21*  --  21*  --  20* 20*  --  20*  --  22  --   GLUCOSE 287*   < > 196*   < > 156*   < > 164*  --  124*  --  180* 196*  --  175* 171* 163*  --   BUN 135*   < > 149*   < > 167*   < > 166*  --  168*  --  177* 191*   --  172* >130* 130*  --   CREATININE 3.20*   < > 3.41*   < > 4.19*   < > 4.15*  --  4.43*  --  4.61* 5.04*  --  4.43* 4.80* 3.47*  --   CALCIUM 7.3*   < > 7.8*   < > 7.4*  --  7.4*  --  7.6*  --  7.5* 7.5*  --  7.5*  --  7.5*  --   PHOS 6.8*  --  6.3*  --   --   --  7.9*  --   --   --  8.1*  --   --  8.5*  --   --   --    < > = values in this interval not displayed.   CBC Recent Labs  Lab 03/27/19 1255 03/27/19 1305 03/28/19 0417 03/28/19 0432 03/29/19 0417 03/29/19 0417 03/29/19 0425 03/29/19 1552 03/30/19 0255 03/30/19 0546  WBC 15.0*  --  21.0*  --  14.8*  --   --   --  14.4*  --   HGB 9.6*   < > 9.7*   < > 8.8*   < > 8.8* 10.9* 8.6* 7.8*  HCT 30.1*   < > 29.5*   < > 26.7*   < > 26.0* 32.0* 26.0* 23.0*  MCV 89.3  --  86.3  --  86.4  --   --   --  85.8  --   PLT 298  --  285  --  238  --   --   --  230  --    < > = values in this interval not displayed.    Medications:    . sodium chloride   Intravenous Once  . aspirin  81 mg Per Tube Daily  . atorvastatin  80 mg Per Tube q1800  . bisacodyl  10 mg Oral Daily   Or  . bisacodyl  10 mg Rectal Daily  . chlorhexidine gluconate (MEDLINE KIT)  15 mL Mouth Rinse BID  . Chlorhexidine Gluconate Cloth  6 each Topical Daily  . docusate  200 mg Per Tube Daily  . feeding supplement (PRO-STAT SUGAR FREE 64)  60 mL Per Tube BID  . insulin aspart  0-24 Units Subcutaneous Q4H  . insulin glargine  18 Units Subcutaneous Daily  . lidocaine  5 mL Intradermal Once  . mouth rinse  15 mL Mouth Rinse 10 times per day  . pantoprazole (PROTONIX) IV  40 mg Intravenous Q24H  . psyllium  1 packet Per Tube BID  . sodium chloride flush  10-40 mL Intracatheter Q12H   Elmarie Shiley,  MD 03/30/2019, 7:17 AM

## 2019-03-30 NOTE — Progress Notes (Signed)
Physical Therapy Treatment Patient Details Name: WINSTON MISNER MRN: [id number] DOB: 1928/10/03 Today's Date: 03/30/2019    History of Present Illness 86 y.o. male with PMH: h/o CAD s/p CABG and clipping of left atrial appendage, who presented with SOB on 2/1. He became hypoxic on 2/2 with pulmonary edema and needed to be intubated on 2/2 and extubated 2/5. Pt underwent impella placement and tracheostomy on 2/9. Pt started on CRRT 2/11.    PT Comments    Pt limited in session, refusing attempts at bed mobility. Pt nonverbally expressing he does not feel strong enough or ready to perform bed mobility at this time. PT provides education on the importance of performing bed mobility and the need to quickly progress bed mobility. Pt agreeable to attempting bed mobility early next week, settling for therapeutic exercise at this time. Pt performs exercise with good form, with significant weakness requiring some physical assistance during exercise to improve joint ROM. Pt will continue to benefit from acute PT POC to improve strength and activity tolerance and to reduce caregiver burden.   Follow Up Recommendations  SNF;Supervision/Assistance - 24 hour     Equipment Recommendations  (defer to post-acute setting)    Recommendations for Other Services       Precautions / Restrictions Precautions Precautions: Fall;Sternal;Other (comment) Precaution Comments: impella Restrictions Weight Bearing Restrictions: No Other Position/Activity Restrictions: sternal, RUE impella    Mobility  Bed Mobility Overal bed mobility: (pt declines attempts at bed mobility)                Transfers                    Ambulation/Gait                 Stairs             Wheelchair Mobility    Modified Rankin (Stroke Patients Only)       Balance                                            Cognition Arousal/Alertness: Awake/alert Behavior During Therapy:  Flat affect Overall Cognitive Status: Difficult to assess                                        Exercises General Exercises - Upper Extremity Elbow Flexion: AROM;10 reps;Both Elbow Extension: AROM;Both;10 reps Digit Composite Flexion: AROM;Both;10 reps Composite Extension: AROM;Both;10 reps General Exercises - Lower Extremity Ankle Circles/Pumps: AROM;Both;20 reps Gluteal Sets: AROM;Both;5 reps Short Arc Quad: Both;AAROM;10 reps Heel Slides: AAROM;Both;5 reps Hip ABduction/ADduction: AAROM;Both;5 reps Straight Leg Raises: AAROM;Both;5 reps Other Exercises Other Exercises: hip IR/ER AROM, 5 reps    General Comments General comments (skin integrity, edema, etc.): pt on trach to vent and CRRT, VSS during session. Pt with significant edema in bilateral hands and legs at this time      Pertinent Vitals/Pain Pain Assessment: No/denies pain    Home Living                      Prior Function            PT Goals (current goals can now be found in the care plan section) Acute Rehab PT Goals Patient Stated Goal: home  Progress towards PT goals: Not progressing toward goals - comment(slowly, pt self-limiting at this time)    Frequency    Min 2X/week      PT Plan Current plan remains appropriate    Co-evaluation              AM-PAC PT "6 Clicks" Mobility   Outcome Measure  Help needed turning from your back to your side while in a flat bed without using bedrails?: Total Help needed moving from lying on your back to sitting on the side of a flat bed without using bedrails?: Total Help needed moving to and from a bed to a chair (including a wheelchair)?: Total Help needed standing up from a chair using your arms (e.g., wheelchair or bedside chair)?: Total Help needed to walk in hospital room?: Total Help needed climbing 3-5 steps with a railing? : Total 6 Click Score: 6    End of Session Equipment Utilized During Treatment:  Oxygen Activity Tolerance: Patient limited by fatigue Patient left: in bed;with call bell/phone within reach;with bed alarm set Nurse Communication: Mobility status PT Visit Diagnosis: Other abnormalities of gait and mobility (R26.89);Muscle weakness (generalized) (M62.81)     Time: 9093-1121 PT Time Calculation (min) (ACUTE ONLY): 16 min  Charges:  $Therapeutic Exercise: 8-22 mins                     Zenaida Niece, PT, DPT Acute Rehabilitation Pager: [phone]    Zenaida Niece 03/30/2019, 12:23 PM

## 2019-03-30 NOTE — Progress Notes (Addendum)
TCTS DAILY ICU PROGRESS NOTE                   [address]            [place] [address]          [phone]   3 Days Post-Op Procedure(s) (LRB): TRACHEOSTOMY placed using Shiley 8DCT Cuffed. (N/A) Placement Of Impella Left Ventricular Assist Device using ABIOMED Impella 5.5 with SmartAssist Device. (Right)  Total Length of Stay:  LOS: 16 days   Subjective: Remains alert , follows conversation appropriately  Objective: Vital signs in last 24 hours: Temp:  [95.7 F (35.4 C)-98.6 F (37 C)] 98.1 F (36.7 C) (02/12 0845) Pulse Rate:  [67-100] 81 (02/12 0845) Cardiac Rhythm: Normal sinus rhythm (02/12 0800) Resp:  [16-35] 23 (02/12 0845) BP: (90-136)/(56-105) 136/56 (02/12 0839) SpO2:  [92 %-99 %] 94 % (02/12 0845) Arterial Line BP: (100-130)/(53-74) 130/56 (02/12 0845) FiO2 (%):  [40 %] 40 % (02/12 0839) Weight:  [105.7 kg] 105.7 kg (02/12 0400)  Filed Weights   03/28/19 0500 03/29/19 0500 03/30/19 0400  Weight: 103.1 kg 104.3 kg 105.7 kg    Weight change: 1.4 kg   Hemodynamic parameters for last 24 hours: PAP: (43-56)/(16-29) 45/19 CVP:  [9 mmHg-20 mmHg] 10 mmHg CO:  [5.1 L/min-7 L/min] 7 L/min CI:  [2.4 L/min/m2-3.3 L/min/m2] 3.3 L/min/m2  Intake/Output from previous day: 02/11 0701 - 02/12 0700 In: 3464 [I.V.:1751.3; Blood:345; NG/GT:840; IV Piggyback:200.1] Out: 3891 [Urine:530]  Intake/Output this shift: Total I/O In: 85.1 [I.V.:51.5; Other:13.6; NG/GT:20] Out: 226 [Urine:22; Other:204]  Current Meds: Scheduled Meds: . aspirin  81 mg Per Tube Daily  . atorvastatin  80 mg Per Tube q1800  . bisacodyl  10 mg Oral Daily   Or  . bisacodyl  10 mg Rectal Daily  . chlorhexidine gluconate (MEDLINE KIT)  15 mL Mouth Rinse BID  . Chlorhexidine Gluconate Cloth  6 each Topical Daily  . docusate  200 mg Per Tube Daily  . feeding supplement (PRO-STAT SUGAR FREE 64)  60 mL Per Tube BID  . insulin aspart  0-24 Units Subcutaneous Q4H  . insulin  glargine  18 Units Subcutaneous Daily  . mouth rinse  15 mL Mouth Rinse 10 times per day  . pantoprazole sodium  40 mg Per Tube Q24H  . psyllium  1 packet Per Tube BID  . sodium chloride flush  10-40 mL Intracatheter Q12H   Continuous Infusions: .  prismasol BGK 4/2.5 400 mL/hr at 03/30/19 0207  .  prismasol BGK 4/2.5 200 mL/hr at 03/29/19 1317  . sodium chloride    . sodium chloride 10 mL/hr at 03/30/19 0800  . sodium chloride 10 mL/hr at 03/29/19 2128  . amiodarone 30 mg/hr (03/30/19 0800)  . cefTAZidime (FORTAZ)  IV Stopped (03/29/19 2143)  . dexmedetomidine (PRECEDEX) IV infusion Stopped (03/30/19 0714)  . epinephrine 4 mcg/min (03/30/19 0800)  . feeding supplement (VITAL AF 1.2 CAL) 1,000 mL (03/28/19 1302)  . impella catheter heparin 25 unit/mL in dextrose 5%    . heparin 750 Units/hr (03/30/19 0800)  . norepinephrine (LEVOPHED) Adult infusion Stopped (03/30/19 0539)  . prismasol BGK 4/2.5 1,500 mL/hr at 03/30/19 0730   PRN Meds:.sodium chloride, dextrose, docusate, fentaNYL (SUBLIMAZE) injection, heparin, heparin, midazolam, ondansetron (ZOFRAN) IV, sodium chloride flush, sodium chloride flush  General appearance: alert, cooperative, fatigued and no distress Neurologic: intact Heart: regular rate and rhythm and some extrasystoles Lungs: coarse BS throughout Abdomen: soft, nontender Extremities: +  edematous all extrems Wound: dressings in place  Lab Results: CBC: Recent Labs    03/29/19 0417 03/29/19 0425 03/30/19 0255 03/30/19 0546  WBC 14.8*  --  14.4*  --   HGB 8.8*   < > 8.6* 7.8*  HCT 26.7*   < > 26.0* 23.0*  PLT 238  --  230  --    < > = values in this interval not displayed.   BMET:  Recent Labs    03/29/19 1541 03/29/19 1541 03/29/19 1552 03/29/19 1552 03/30/19 0255 03/30/19 0546  NA 131*   < > 128*   < > 132* 134*  K 3.7   < > 3.6   < > 3.6 3.2*  CL 92*   < > 91*  --  96*  --   CO2 20*  --   --   --  22  --   GLUCOSE 175*   < > 171*  --  163*   --   BUN 172*   < > >130*  --  130*  --   CREATININE 4.43*   < > 4.80*  --  3.47*  --   CALCIUM 7.5*  --   --   --  7.5*  --    < > = values in this interval not displayed.    CMET: Lab Results  Component Value Date   WBC 14.4 (H) 03/30/2019   HGB 7.8 (L) 03/30/2019   HCT 23.0 (L) 03/30/2019   PLT 230 03/30/2019   GLUCOSE 163 (H) 03/30/2019   CHOL 117 03/16/2019   TRIG 52 03/16/2019   HDL 33 (L) 03/16/2019   LDLCALC 74 03/16/2019   ALT 24 03/30/2019   AST 47 (H) 03/30/2019   NA 134 (L) 03/30/2019   K 3.2 (L) 03/30/2019   CL 96 (L) 03/30/2019   CREATININE 3.47 (H) 03/30/2019   BUN 130 (H) 03/30/2019   CO2 22 03/30/2019   TSH 3.683 03/13/2019   INR 1.7 (H) 03/27/2019   HGBA1C 6.1 (H) 03/13/2019      PT/INR:  Recent Labs    03/27/19 1255  LABPROT 19.5*  INR 1.7*   Radiology: DG Chest 1 View  Result Date: 03/29/2019 CLINICAL DATA:  Follow-up Impella device EXAM: CHEST  1 VIEW COMPARISON:  03/29/2019 FINDINGS: Tracheostomy tube, gastric catheter and right-sided PICC line are again noted and stable in satisfactory position. Impella and Swan-Ganz catheter is well as postoperative changes are again seen and stable. Vascular congestion is again noted as well as bilateral effusions relatively stable from the prior exam. No new focal infiltrate is seen. IMPRESSION: Tubes and lines as described above stable from the prior exam. Stable vascular congestion and pleural effusions. Electronically Signed   By: Inez Catalina M.D.   On: 03/29/2019 12:35   DG Chest Port 1 View  Result Date: 03/30/2019 CLINICAL DATA:  Tracheostomy, I LVAD EXAM: PORTABLE CHEST 1 VIEW COMPARISON:  Portable exam 0537 hours compared to 03/29/2019 FINDINGS: Tracheostomy tube, nasogastric tube, dual lumen LEFT subclavian line, RIGHT jugular Swan-Ganz catheter, and RIGHT-side Impella device unchanged. 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 Bibasilar pleural effusions and atelectasis. Upper  lungs clear. No pneumothorax. IMPRESSION: Bibasilar pleural effusions and atelectasis. Stable support devices. Electronically Signed   By: Lavonia Dana M.D.   On: 03/30/2019 08:41     Assessment/Plan: S/P Procedure(s) (LRB): TRACHEOSTOMY placed using Shiley 8DCT Cuffed. (N/A) Placement Of Impella Left Ventricular Assist Device using ABIOMED Impella 5.5 with  SmartAssist Device. (Right)  1 remains critical ill 2 hemodynamics stable on Impella P6 with good flows, current gtts epi/amio- Co-Ox 69 3 currently in sinus with PVC's 4 CRRT per nephrology, BUN/Creat improved 5 CHF - AHF team asisting with management 6 VDRF, + trach - per PCCM management 7 H/H dropping, on heparin gtt- cont to monitor closely as may need transfusion, no source  Jesse Giovanni PA-C 03/30/2019 9:08 AM  Pager [phone]  Patient examined, agree with above note Removing volume well with CRT Maintaining NSR and norepi weaned off Post pyloric feeding tube today PM CBC to follow anemi transfuse for Hb < 8.0  Ivin Poot MD

## 2019-03-30 NOTE — Progress Notes (Addendum)
Advanced Heart Failure Rounding Note  PCP-Cardiologist: Kate Sable, MD   Subjective:    Remains on impella 5.5 support, P-6, Flow 4 CPO 1.2   Started CVVHD 2/11  Back in NSR this morning on Amio 60 mg per hour.    CO-OX 69%. On Epi 2.   Swan #s CVP 13 PAP 47/20 CO 5.6 CI 2.6    Objective:   Weight Range: 105.7 kg Body mass index is 35.43 kg/m.   Vital Signs:   Temp:  [95.7 F (35.4 C)-97.9 F (36.6 C)] 97.9 F (36.6 C) (02/12 0600) Pulse Rate:  [67-104] 74 (02/12 0600) Resp:  [16-33] 26 (02/12 0600) BP: (90-116)/(63-105) 99/66 (02/12 0600) SpO2:  [92 %-99 %] 95 % (02/12 0600) Arterial Line BP: (100-138)/(53-87) 101/54 (02/12 0600) FiO2 (%):  [40 %] 40 % (02/12 0430) Weight:  [105.7 kg] 105.7 kg (02/12 0400) Last BM Date: 03/27/19  Weight change: Filed Weights   03/28/19 0500 03/29/19 0500 03/30/19 0400  Weight: 103.1 kg 104.3 kg 105.7 kg    Intake/Output:   Intake/Output Summary (Last 24 hours) at 03/30/2019 3545 Last data filed at 03/30/2019 0600 Gross per 24 hour  Intake 3497.02 ml  Output 3801 ml  Net -303.98 ml      Physical Exam   General: Appears critically ill. On vent   HEENT: normal Neck: Trach. Unable to abe to assess JVP. Carotids 2+ bilat; no bruits. No lymphadenopathy or thryomegaly appreciated. Cor: PMI nondisplaced. Regular rate & rhythm. No rubs, gallops or murmurs. R axillary impella.  Lungs: clear on vent  Abdomen: soft, nontender, nondistended. No hepatosplenomegaly. No bruits or masses. Good bowel sounds. Extremities: no cyanosis, clubbing, rash, R and LLE 3+ edema. Compression wraps.  Neuro: MAE x3. Follows commands.   Telemetry  NSR 70s personally reviewed.   EKG    No new EKG to review   Labs    CBC Recent Labs    03/29/19 0417 03/29/19 0425 03/30/19 0255 03/30/19 0546  WBC 14.8*  --  14.4*  --   HGB 8.8*   < > 8.6* 7.8*  HCT 26.7*   < > 26.0* 23.0*  MCV 86.4  --  85.8  --   PLT 238  --  230  --     < > = values in this interval not displayed.   Basic Metabolic Panel Recent Labs    03/28/19 1532 03/29/19 0417 03/29/19 1541 03/29/19 1541 03/29/19 1552 03/29/19 1552 03/30/19 0255 03/30/19 0546  NA 131*   < > 131*   < > 128*   < > 132* 134*  K 3.7   < > 3.7   < > 3.6   < > 3.6 3.2*  CL 91*   < > 92*   < > 91*  --  96*  --   CO2 20*   < > 20*  --   --   --  22  --   GLUCOSE 180*   < > 175*   < > 171*  --  163*  --   BUN 177*   < > 172*   < > >130*  --  130*  --   CREATININE 4.61*   < > 4.43*   < > 4.80*  --  3.47*  --   CALCIUM 7.5*   < > 7.5*  --   --   --  7.5*  --   MG  --   --   --   --   --   --  2.4  --   PHOS 8.1*  --  8.5*  --   --   --   --   --    < > = values in this interval not displayed.   Liver Function Tests Recent Labs    03/29/19 0417 03/29/19 0417 03/29/19 1541 03/30/19 0255  AST 52*  --   --  47*  ALT 32  --   --  24  ALKPHOS 57  --   --  62  BILITOT 0.6  --   --  0.6  PROT 5.5*  --   --  5.4*  ALBUMIN 2.1*   < > 2.2* 2.0*   < > = values in this interval not displayed.   No results for input(s): LIPASE, AMYLASE in the last 72 hours. Cardiac Enzymes No results for input(s): CKTOTAL, CKMB, CKMBINDEX, TROPONINI in the last 72 hours.  BNP: BNP (last 3 results) Recent Labs    03/22/19 0401 03/26/19 0626 03/27/19 1255  BNP 340.4* 687.5* 800.4*    ProBNP (last 3 results) No results for input(s): PROBNP in the last 8760 hours.   D-Dimer No results for input(s): DDIMER in the last 72 hours. Hemoglobin A1C No results for input(s): HGBA1C in the last 72 hours. Fasting Lipid Panel No results for input(s): CHOL, HDL, LDLCALC, TRIG, CHOLHDL, LDLDIRECT in the last 72 hours. Thyroid Function Tests No results for input(s): TSH, T4TOTAL, T3FREE, THYROIDAB in the last 72 hours.  Invalid input(s): FREET3  Other results:   Imaging    DG Chest 1 View  Result Date: 03/29/2019 CLINICAL DATA:  Follow-up Impella device EXAM: CHEST  1 VIEW  COMPARISON:  03/29/2019 FINDINGS: Tracheostomy tube, gastric catheter and right-sided PICC line are again noted and stable in satisfactory position. Impella and Swan-Ganz catheter is well as postoperative changes are again seen and stable. Vascular congestion is again noted as well as bilateral effusions relatively stable from the prior exam. No new focal infiltrate is seen. IMPRESSION: Tubes and lines as described above stable from the prior exam. Stable vascular congestion and pleural effusions. Electronically Signed   By: Inez Catalina M.D.   On: 03/29/2019 12:35     Medications:     Scheduled Medications: . sodium chloride   Intravenous Once  . aspirin  81 mg Per Tube Daily  . atorvastatin  80 mg Per Tube q1800  . bisacodyl  10 mg Oral Daily   Or  . bisacodyl  10 mg Rectal Daily  . chlorhexidine gluconate (MEDLINE KIT)  15 mL Mouth Rinse BID  . Chlorhexidine Gluconate Cloth  6 each Topical Daily  . docusate  200 mg Per Tube Daily  . feeding supplement (PRO-STAT SUGAR FREE 64)  60 mL Per Tube BID  . insulin aspart  0-24 Units Subcutaneous Q4H  . insulin glargine  18 Units Subcutaneous Daily  . lidocaine  5 mL Intradermal Once  . mouth rinse  15 mL Mouth Rinse 10 times per day  . pantoprazole (PROTONIX) IV  40 mg Intravenous Q24H  . psyllium  1 packet Per Tube BID  . sodium chloride flush  10-40 mL Intracatheter Q12H    Infusions: .  prismasol BGK 4/2.5 400 mL/hr at 03/30/19 0207  .  prismasol BGK 4/2.5 200 mL/hr at 03/29/19 1317  . sodium chloride    . sodium chloride 10 mL/hr at 03/30/19 0600  . sodium chloride 10 mL/hr at 03/29/19 2128  . sodium chloride Stopped (03/27/19 0945)  . amiodarone 60  mg/hr (03/30/19 0600)  . cefTAZidime (FORTAZ)  IV Stopped (03/29/19 2143)  . dexmedetomidine (PRECEDEX) IV infusion 0.2 mcg/kg/hr (03/30/19 0600)  . epinephrine 2 mcg/min (03/30/19 0600)  . feeding supplement (VITAL AF 1.2 CAL) 1,000 mL (03/28/19 1302)  . impella catheter heparin 25  unit/mL in dextrose 5%    . heparin 750 Units/hr (03/30/19 0600)  . norepinephrine (LEVOPHED) Adult infusion Stopped (03/30/19 0539)  . prismasol BGK 4/2.5 1,500 mL/hr at 03/30/19 0320    PRN Medications: sodium chloride, dextrose, docusate, fentaNYL (SUBLIMAZE) injection, fentaNYL (SUBLIMAZE) injection, heparin, heparin, midazolam, ondansetron (ZOFRAN) IV, sodium chloride flush, sodium chloride flush    Assessment/Plan    1. Acute systolic HF -> Cardiogenic shock - post-op echo on 03/20/19 EF 25-30% (pre-op 30-35%. In 12/2017 EF normal) - Remains on Impella support P-6 - CO-OX 69%. Now CVVHD.  -On epi 2 mcg.  - No b,block, ACE/ARB, spiro or dig with shock and renal failure currently  2. CAD s/p CABG this admit (LIMA->LAD, SVG->OM, SVG->RCA on 03/15/19) - No s/s angina - Continue ASA/statin  3. Acute hypoxic respiratory failure - s/p trach on 03/27/19 - CCM managing  4. PAF - s/p MAZE and LAA occlusion - Back in SR last night.   - continue IV amio but will cut back to 30 mg per hour.   5. AKI  - due to shock/ATN -Creatinine peaked at 5.04 - Started [facility] 03/29/19 per Nephrology.   Length of Stay: Kosse, NP  03/30/2019, 6:52 AM  Advanced Heart Failure Team Pager [phone] (M-F; 7a - 4p)  Please contact [facility] for night-coverage after hours (4p -7a ) and weekends on [email or web address]  Agree with above.   He remains on vent through trach. On CVVHD pulling -100. Also on low dose epi and NE. Impella 5.5 on P-6. Waveforms stable. Back in NSR on IV amio  General:  Elderly on vent through trach  HEENT: normal Neck: supple. + trach no JVD. Carotids 2+ bilat; no bruits. No lymphadenopathy or thryomegaly appreciated. Cor: Impella site and sternal wound ok  PMI nondisplaced. Regular rate & rhythm. No rubs, gallops or murmurs. Lungs: clear Abdomen: soft, nontender, nondistended. No hepatosplenomegaly. No bruits or masses. Good bowel sounds. Extremities: no cyanosis,  clubbing, rash, 3-4+ edema Neuro: alert follows commands   He remains critically ill. Hemodynamically improved with full support with impella and dual pressors. Remains massively volume overloaded. Increase CVVHD to -200/hr.   Glori Bickers, MD  3:53 PM

## 2019-03-30 NOTE — Progress Notes (Signed)
[place] for anticoagulation management/assistance  Indication: Impella 5.5  No Known Allergies  Patient Measurements: Height: 5\' 8"  (172.7 cm) Weight: 233 lb 0.4 oz (105.7 kg) IBW/kg (Calculated) : 68.4 Heparin Dosing Weight: 86kg  Vital Signs: Temp: 97.8 F (36.6 C) (02/12 1228) Temp Source: Oral (02/12 1228) BP: 112/69 (02/12 1630) Pulse Rate: 75 (02/12 1630)  Labs: Recent Labs    03/28/19 0417 03/28/19 1532 03/29/19 0417 03/29/19 0417 03/29/19 0425 03/29/19 0942 03/29/19 1541 03/29/19 1552 03/29/19 1552 03/29/19 1735 03/30/19 0255 03/30/19 0255 03/30/19 0546 03/30/19 1510  HGB   < >  --  8.8*  --    < >  --   --  10.9*   < >  --  8.6*   < > 7.8* 8.6*  HCT   < >  --  26.7*  --    < >  --   --  32.0*   < >  --  26.0*  --  23.0* 26.3*  PLT   < >  --  238  --   --   --   --   --   --   --  230  --   --  256  APTT  --  59* 176*  --   --  126*  --   --   --   --   --   --   --   --   HEPARINUNFRC   < > <0.10* 0.34   < >  --   --   --   --   --  0.38 0.34  --   --  0.34  CREATININE   < > 4.61* 5.04*  --   --   --    < > 4.80*  --   --  3.47*  --   --  2.73*   < > = values in this interval not displayed.    Estimated Creatinine Clearance: 22 mL/min (A) (by C-G formula based on SCr of 2.73 mg/dL (H)).  Assessment: 86 year old male s/p CABG 1/29 with MAZE and LAA. Patient taken to OR 2/9 for tracheostomy and impella 5.5 placement.  Patient currently receiving heparin purge 25 units/ml (1/2 strength) providing about 340 units/hr of heparin.    Purge is half/strength (25 units/ml) infusing ~340 units/h.  If systemic heparin >1000 units/hr (10 units/kg) needed, will need to discuss with MD.  Heparin level this PM is therapeutic at 0.34 units/mL.  CBC stable.  Goal of Therapy:  Heparin level ~0.3 units/mL per Dr. VT Monitor platelets by anticoagulation protocol: Yes   Plan:   Continue 1/2 strength heparin purge per MD Continue  heparin gtt at 750 units/hr Q12H heparin level Will continue to follow along with surgery  Jahzeel Poythress D. Mina Marble, PharmD, BCPS, [place] 03/30/2019, 4:36 PM

## 2019-03-30 NOTE — Progress Notes (Addendum)
Nutrition Follow up   DOCUMENTATION CODES:   Not applicable  INTERVENTION:   Add b complex with Vitamin C to account for losses with CRRT  Change tube feeding:  -Vital 1.5 @ 30 ml/hr via [name] (720 ml) -60 ml Prostat TID  Provides: 1680 kcals, 139 grams protein, 550 ml free water.   NUTRITION DIAGNOSIS:   Increased nutrient needs related to post-op healing as evidenced by estimated needs.  Ongoing  GOAL:   Patient will meet greater than or equal to 90% of their needs   Addressed via TF   MONITOR:   Weight trends, Diet advancement, Vent status, Skin, TF tolerance, Labs, I & O's  REASON FOR ASSESSMENT:   Consult Enteral/tube feeding initiation and management  ASSESSMENT:   Patient with PMH significant for CAD s/p PCI, prostate cancer, HTN, CHF, and DM. Presents this admission with chronic a.fib with RVR.   1/28- s/p L heart cath  1/30- s/p CABG x3, MAZE, extubated  2/2- re-intubated  2/5- extubated 2/8- re-intubated  2/10- s/p impella, trach, NGT placed-start trickle feeding 2/11- start CRRT  Pt discussed during ICU rounds and with RN.   Remains on pressors. Continues with CRRT. Per RN, after initial [name] tube placement pt showed to vomit small amount. OGT to suction- 300 ml frothy liquid in canister. Question if it's tube feeding. [name] able to be advanced post-pyloric after. Changed to more calorically dense formula to lower goal rate. D/C metamucil- okay per CCM.   Admission weight: 89.3 kg  Current weight: 105.7 kg (up from 95.1 kg on 2/7)  Patient on ventilator support via trach MV: 9.6 L/min Temp (24hrs), Avg:97.4 F (36.3 C), Min:95.7 F (35.4 C), Max:98.6 F (37 C)  I/O: +8,004 ml since 1/29 UOP: 530 ml x 24 hrs CRRT: 2,489 ml x 24 hrs   Drips: 1/2NS @ 10 ml/hr, precedex, epinephrine, levophed Medications: dulcolax, colace, SS novolog, lantus Labs: Na 134 (L) K 3.2 (L) Cr 3.47- down BUN 97- down from yesterday CBG 122-196  Diet  Order:   Diet Order    None      EDUCATION NEEDS:   Not appropriate for education at this time  Skin:  Skin Assessment: Skin Integrity Issues: Skin Integrity Issues:: Stage I, Incisions Stage I: back Incisions: chest, leg  Last BM:  2/11  Height:   Ht Readings from Last 1 Encounters:  03/26/19 5\' 8"  (1.727 m)    Weight:   Wt Readings from Last 1 Encounters:  03/30/19 105.7 kg    Ideal Body Weight:  70 kg  BMI:  Body mass index is 35.43 kg/m.  Estimated Nutritional Needs:   Kcal:  1614 kcal  Protein:  135-150 grams  Fluid:  >/= 1.6 L/day   Mariana Single RD, LDN Clinical Nutrition Pager # [phone]

## 2019-03-30 NOTE — Progress Notes (Addendum)
EVENING ROUNDS NOTE :     Jesse [address]       [place] [address]             [phone]                 3 Days Post-Op Procedure(s) (LRB): TRACHEOSTOMY placed using Shiley 8DCT Cuffed. (N/A) Placement Of Impella Left Ventricular Assist Device using ABIOMED Impella 5.5 with SmartAssist Device. (Right)  Total Length of Stay:  LOS: 16 days  BP 118/66 (BP Location: Left Arm)   Pulse 79   Temp 97.8 F (36.6 C) (Oral)   Resp 19   Ht 5\' 8"  (1.727 m)   Wt 105.7 kg   SpO2 99%   BMI 35.43 kg/m   .Intake/Output      02/12 0701 - 02/13 0700   I.V. (mL/kg) 954.9 (9)   Blood    Other 176.9   NG/GT 686   IV Piggyback 116.3   Total Intake(mL/kg) 1934.1 (18.3)   Urine (mL/kg/hr) 48 (0)   Emesis/NG output 350   Other 3018   Stool    Total Output 3416   Net -1481.9         .  prismasol BGK 4/2.5 400 mL/hr at 03/30/19 1542  .  prismasol BGK 4/2.5 200 mL/hr at 03/29/19 1317  . sodium chloride    . sodium chloride 10 mL/hr at 03/30/19 2000  . sodium chloride 10 mL/hr at 03/29/19 2128  . amiodarone 60 mg/hr (03/30/19 2000)  . cefTAZidime (FORTAZ)  IV Stopped (03/30/19 4627)  . dexmedetomidine (PRECEDEX) IV infusion Stopped (03/30/19 0714)  . epinephrine 3 mcg/min (03/30/19 2000)  . feeding supplement (VITAL 1.5 CAL) 1,000 mL (03/30/19 1758)  . impella catheter heparin 25 unit/mL in dextrose 5%    . heparin 750 Units/hr (03/30/19 2000)  . norepinephrine (LEVOPHED) Adult infusion 4 mcg/min (03/30/19 2024)  . prismasol BGK 4/2.5 1,500 mL/hr at 03/30/19 1746     Lab Results  Component Value Date   WBC 16.6 (H) 03/30/2019   HGB 8.6 (L) 03/30/2019   HCT 26.3 (L) 03/30/2019   PLT 256 03/30/2019   GLUCOSE 151 (H) 03/30/2019   CHOL 117 03/16/2019   TRIG 52 03/16/2019   HDL 33 (L) 03/16/2019   LDLCALC 74 03/16/2019   ALT 24 03/30/2019   AST 47 (H) 03/30/2019   NA 132 (L) 03/30/2019   K 4.1 03/30/2019   CL 97 (L) 03/30/2019   CREATININE 2.73 (H) 03/30/2019   BUN 97 (H) 03/30/2019   CO2 23 03/30/2019   TSH 3.683 03/13/2019   INR 1.7 (H) 03/27/2019   HGBA1C 6.1 (H) 03/13/2019  Hemodynamically stable, SR. On Amiodarone, Epinephrine 3 mcg/min, and Nor epi 4 mcg/min VDRF-trach UO decreased over last 12 hours so foley removed. Bladder scan showed 284 cc, GU area swollen, mildly distended, and there are clots/ Per Dr. Prescott Gum, bladder scan every 4 hours and if > or = to 400 cc, call MD for further orders. Will restart Urecholine 5 mg tid via tube H and H this afternoon stable at 8.6 and 26.3 Cr this afternoon decreased to 2.73. He is having CVVH KUB this evening showed non obstructive gas pattern and feeding tube tip in the distal duodenum near the ligament of Treitz. Tolerating TFs  Jesse Pinks PA-C 03/30/2019 8:38 PM  On CRT- leave foley out postpyloric feeding tube in place patient examined and medical record reviewed,agree with above note. Jesse Murphy 03/30/2019

## 2019-03-31 ENCOUNTER — Inpatient Hospital Stay (HOSPITAL_COMMUNITY): Payer: Medicare Other

## 2019-03-31 LAB — COMPREHENSIVE METABOLIC PANEL
ALT: 24 U/L (ref 0–44)
AST: 47 U/L — ABNORMAL HIGH (ref 15–41)
Albumin: 2 g/dL — ABNORMAL LOW (ref 3.5–5.0)
Alkaline Phosphatase: 65 U/L (ref 38–126)
Anion gap: 12 (ref 5–15)
BUN: 70 mg/dL — ABNORMAL HIGH (ref 8–23)
CO2: 23 mmol/L (ref 22–32)
Calcium: 7.8 mg/dL — ABNORMAL LOW (ref 8.9–10.3)
Chloride: 99 mmol/L (ref 98–111)
Creatinine, Ser: 2.32 mg/dL — ABNORMAL HIGH (ref 0.61–1.24)
GFR calc Af Amer: 28 mL/min — ABNORMAL LOW (ref >60)
GFR calc non Af Amer: 24 mL/min — ABNORMAL LOW (ref >60)
Glucose, Bld: 182 mg/dL — ABNORMAL HIGH (ref 70–99)
Potassium: 4.4 mmol/L (ref 3.5–5.1)
Sodium: 134 mmol/L — ABNORMAL LOW (ref 135–145)
Total Bilirubin: 0.7 mg/dL (ref 0.3–1.2)
Total Protein: 5.7 g/dL — ABNORMAL LOW (ref 6.5–8.1)

## 2019-03-31 LAB — RENAL FUNCTION PANEL
Albumin: 2 g/dL — ABNORMAL LOW (ref 3.5–5.0)
Anion gap: 12 (ref 5–15)
BUN: 58 mg/dL — ABNORMAL HIGH (ref 8–23)
CO2: 22 mmol/L (ref 22–32)
Calcium: 7.7 mg/dL — ABNORMAL LOW (ref 8.9–10.3)
Chloride: 100 mmol/L (ref 98–111)
Creatinine, Ser: 2.03 mg/dL — ABNORMAL HIGH (ref 0.61–1.24)
GFR calc Af Amer: 33 mL/min — ABNORMAL LOW (ref >60)
GFR calc non Af Amer: 28 mL/min — ABNORMAL LOW (ref >60)
Glucose, Bld: 194 mg/dL — ABNORMAL HIGH (ref 70–99)
Phosphorus: 3.3 mg/dL (ref 2.5–4.6)
Potassium: 4.1 mmol/L (ref 3.5–5.1)
Sodium: 134 mmol/L — ABNORMAL LOW (ref 135–145)

## 2019-03-31 LAB — COOXEMETRY PANEL
Carboxyhemoglobin: 1.2 % (ref 0.5–1.5)
Methemoglobin: 0.5 % (ref 0.0–1.5)
O2 Saturation: 75.3 %
Total hemoglobin: 11.1 g/dL — ABNORMAL LOW (ref 12.0–16.0)

## 2019-03-31 LAB — POCT I-STAT 7, (LYTES, BLD GAS, ICA,H+H)
Acid-base deficit: 2 mmol/L (ref 0.0–2.0)
Bicarbonate: 22.8 mmol/L (ref 20.0–28.0)
Calcium, Ion: 1.11 mmol/L — ABNORMAL LOW (ref 1.15–1.40)
HCT: 24 % — ABNORMAL LOW (ref 39.0–52.0)
Hemoglobin: 8.2 g/dL — ABNORMAL LOW (ref 13.0–17.0)
O2 Saturation: 93 %
Potassium: 4.3 mmol/L (ref 3.5–5.1)
Sodium: 133 mmol/L — ABNORMAL LOW (ref 135–145)
TCO2: 24 mmol/L (ref 22–32)
pCO2 arterial: 37.5 mmHg (ref 32.0–48.0)
pH, Arterial: 7.391 (ref 7.350–7.450)
pO2, Arterial: 66 mmHg — ABNORMAL LOW (ref 83.0–108.0)

## 2019-03-31 LAB — CBC
HCT: 25.3 % — ABNORMAL LOW (ref 39.0–52.0)
HCT: 27.7 % — ABNORMAL LOW (ref 39.0–52.0)
Hemoglobin: 8.1 g/dL — ABNORMAL LOW (ref 13.0–17.0)
Hemoglobin: 8.7 g/dL — ABNORMAL LOW (ref 13.0–17.0)
MCH: 28.5 pg (ref 26.0–34.0)
MCH: 28.2 pg (ref 26.0–34.0)
MCHC: 32 g/dL (ref 30.0–36.0)
MCHC: 31.4 g/dL (ref 30.0–36.0)
MCV: 89.1 fL (ref 80.0–100.0)
MCV: 89.9 fL (ref 80.0–100.0)
Platelets: 265 10*3/uL (ref 150–400)
Platelets: 187 10*3/uL (ref 150–400)
RBC: 2.84 MIL/uL — ABNORMAL LOW (ref 4.22–5.81)
RBC: 3.08 MIL/uL — ABNORMAL LOW (ref 4.22–5.81)
RDW: 16.2 % — ABNORMAL HIGH (ref 11.5–15.5)
RDW: 15.6 % — ABNORMAL HIGH (ref 11.5–15.5)
WBC: 18.4 10*3/uL — ABNORMAL HIGH (ref 4.0–10.5)
WBC: 18.2 10*3/uL — ABNORMAL HIGH (ref 4.0–10.5)
nRBC: 0.2 % (ref 0.0–0.2)
nRBC: 0.1 % (ref 0.0–0.2)

## 2019-03-31 LAB — MAGNESIUM: Magnesium: 2.5 mg/dL — ABNORMAL HIGH (ref 1.7–2.4)

## 2019-03-31 LAB — POCT I-STAT, CHEM 8
BUN: >130 mg/dL — ABNORMAL HIGH (ref 8–23)
Calcium, Ion: 0.99 mmol/L — ABNORMAL LOW (ref 1.15–1.40)
Chloride: 91 mmol/L — ABNORMAL LOW (ref 98–111)
Creatinine, Ser: 4.8 mg/dL — ABNORMAL HIGH (ref 0.61–1.24)
Glucose, Bld: 174 mg/dL — ABNORMAL HIGH (ref 70–99)
HCT: 27 % — ABNORMAL LOW (ref 39.0–52.0)
Hemoglobin: 9.2 g/dL — ABNORMAL LOW (ref 13.0–17.0)
Potassium: 3.7 mmol/L (ref 3.5–5.1)
Sodium: 130 mmol/L — ABNORMAL LOW (ref 135–145)
TCO2: 22 mmol/L (ref 22–32)

## 2019-03-31 LAB — GLUCOSE, CAPILLARY
Glucose-Capillary: 194 mg/dL — ABNORMAL HIGH (ref 70–99)
Glucose-Capillary: 166 mg/dL — ABNORMAL HIGH (ref 70–99)
Glucose-Capillary: 146 mg/dL — ABNORMAL HIGH (ref 70–99)
Glucose-Capillary: 169 mg/dL — ABNORMAL HIGH (ref 70–99)
Glucose-Capillary: 164 mg/dL — ABNORMAL HIGH (ref 70–99)

## 2019-03-31 LAB — HEPARIN LEVEL (UNFRACTIONATED)
Heparin Unfractionated: 0.41 IU/mL (ref 0.30–0.70)
Heparin Unfractionated: <0.1 IU/mL — ABNORMAL LOW (ref 0.30–0.70)

## 2019-03-31 LAB — LACTATE DEHYDROGENASE: LDH: 258 U/L — ABNORMAL HIGH (ref 98–192)

## 2019-03-31 LAB — PREPARE RBC (CROSSMATCH)

## 2019-03-31 MED ORDER — TAMSULOSIN HCL 0.4 MG PO CAPS
0.4000 mg | ORAL_CAPSULE | Freq: Every day | ORAL | Status: DC
  Administered 2019-03-31 – 2019-04-15 (×15): 0.4 mg via ORAL
  Filled 2019-03-31 (×15): qty 1

## 2019-03-31 MED ORDER — VASOPRESSIN 20 UNIT/ML IV SOLN
0.0000 [IU]/min | INTRAVENOUS | Status: DC
  Administered 2019-03-31: 0.03 [IU]/min via INTRAVENOUS
  Administered 2019-04-01 – 2019-04-02 (×2): 0.04 [IU]/min via INTRAVENOUS
  Administered 2019-04-02: 0.05 [IU]/min via INTRAVENOUS
  Administered 2019-04-04: 0.03 [IU]/min via INTRAVENOUS
  Administered 2019-04-04: 0.05 [IU]/min via INTRAVENOUS
  Administered 2019-04-05 – 2019-04-08 (×4): 0.03 [IU]/min via INTRAVENOUS
  Administered 2019-04-10: 0.02 [IU]/min via INTRAVENOUS
  Filled 2019-03-31 (×15): qty 2

## 2019-03-31 MED ORDER — FENTANYL CITRATE (PF) 100 MCG/2ML IJ SOLN
50.0000 ug | INTRAMUSCULAR | Status: DC | PRN
  Administered 2019-03-31 – 2019-04-07 (×10): 50 ug via INTRAVENOUS
  Filled 2019-03-31 (×10): qty 2

## 2019-03-31 MED ORDER — NOREPINEPHRINE 16 MG/250ML-% IV SOLN
2.0000 ug/min | INTRAVENOUS | Status: DC
  Administered 2019-03-31: 10 ug/min via INTRAVENOUS
  Administered 2019-04-01: 12 ug/min via INTRAVENOUS
  Administered 2019-04-02: 40 ug/min via INTRAVENOUS
  Administered 2019-04-02: 12 ug/min via INTRAVENOUS
  Administered 2019-04-03: 01:00:00 32 ug/min via INTRAVENOUS
  Administered 2019-04-03: 09:00:00 36 ug/min via INTRAVENOUS
  Administered 2019-04-04: 40 ug/min via INTRAVENOUS
  Administered 2019-04-04: 16 ug/min via INTRAVENOUS
  Administered 2019-04-04: 32 ug/min via INTRAVENOUS
  Administered 2019-04-05: 13 ug/min via INTRAVENOUS
  Administered 2019-04-07: 10 ug/min via INTRAVENOUS
  Administered 2019-04-08: 11 ug/min via INTRAVENOUS
  Administered 2019-04-09: 08:00:00 12 ug/min via INTRAVENOUS
  Administered 2019-04-10: 10:00:00 14 ug/min via INTRAVENOUS
  Administered 2019-04-14: 3 ug/min via INTRAVENOUS
  Administered 2019-04-15: 20:00:00 6 ug/min via INTRAVENOUS
  Filled 2019-03-31 (×19): qty 250

## 2019-03-31 MED ORDER — IPRATROPIUM-ALBUTEROL 0.5-2.5 (3) MG/3ML IN SOLN
3.0000 mL | RESPIRATORY_TRACT | Status: DC | PRN
  Administered 2019-03-31: 3 mL via RESPIRATORY_TRACT
  Filled 2019-03-31: qty 3

## 2019-03-31 MED ORDER — ACETAMINOPHEN 325 MG PO TABS
650.0000 mg | ORAL_TABLET | Freq: Four times a day (QID) | ORAL | Status: DC | PRN
  Administered 2019-03-31: 650 mg via ORAL
  Filled 2019-03-31: qty 2

## 2019-03-31 MED ORDER — AMIODARONE LOAD VIA INFUSION
150.0000 mg | Freq: Once | INTRAVENOUS | Status: AC
  Administered 2019-03-31: 150 mg via INTRAVENOUS

## 2019-03-31 MED ORDER — ACETAMINOPHEN 160 MG/5ML PO SOLN
325.0000 mg | ORAL | Status: DC | PRN
  Administered 2019-03-31 – 2019-04-23 (×4): 325 mg via ORAL
  Filled 2019-03-31 (×4): qty 20.3

## 2019-03-31 MED ORDER — OXYCODONE HCL 5 MG/5ML PO SOLN
5.0000 mg | ORAL | Status: DC | PRN
  Administered 2019-03-31 – 2019-04-29 (×34): 5 mg via ORAL
  Filled 2019-03-31 (×36): qty 5

## 2019-03-31 NOTE — Progress Notes (Signed)
Advanced Heart Failure Rounding Note  PCP-Cardiologist: Kate Sable, MD   Subjective:    Remains on impella 5.5 support, P-6, Flow 4 CPO 1.2   Started CVVHD 2/11. UF rate increased to -200 yesterday. Weight down 7 pounds.   Respiratory culture with pseudomonas on Fortaz  Back in AF this morning on Amio 60 mg per hour.   Foley replaced then had bloody drainage. Now with Pilar Plate blood in Foley bag. Having pain. No CP or SOB   CO-OX 75%. On Epi 3. NE 10 .   Swan #s this am  (now out) CVP 13 PAP 47/20 CO 5.1 CI 2.4   Objective:   Weight Range: 102.7 kg Body mass index is 34.43 kg/m.   Vital Signs:   Temp:  [97.4 F (36.3 C)-97.9 F (36.6 C)] 97.5 F (36.4 C) (02/13 1225) Pulse Rate:  [25-120] 56 (02/13 1145) Resp:  [11-38] 27 (02/13 1225) BP: (52-136)/(35-101) 110/73 (02/13 1145) SpO2:  [84 %-100 %] 99 % (02/13 1145) Arterial Line BP: (69-137)/(49-76) 112/63 (02/13 1225) FiO2 (%):  [40 %] 40 % (02/13 1225) Weight:  [102.7 kg] 102.7 kg (02/13 0500) Last BM Date: 03/29/19  Weight change: Filed Weights   03/29/19 0500 03/30/19 0400 03/31/19 0500  Weight: 104.3 kg 105.7 kg 102.7 kg    Intake/Output:   Intake/Output Summary (Last 24 hours) at 03/31/2019 1226 Last data filed at 03/31/2019 1225 Gross per 24 hour  Intake 4670.39 ml  Output 7690 ml  Net -3019.61 ml      Physical Exam   General:  Elderly weak appearing. No resp difficulty HEENT: normal Neck: supple. + trach  RIJ TLC. LIJ trialysis Carotids 2+ bilat; no bruits. No lymphadenopathy or thryomegaly appreciated. Cor: PMI nondisplaced. IRR tachy No rubs, gallops or murmurs. Lungs: clear Abdomen: soft, nontender, nondistended. No hepatosplenomegaly. No bruits or masses. Good bowel sounds. Extremities: no cyanosis, clubbing, rash, 3+ edema  Foley + blood Neuro: alert & orientedx3, cranial nerves grossly intact. moves all 4 extremities w/o difficulty. Affect pleasant   Telemetry   AF 90-105  personally reviewed.   Labs    CBC Recent Labs    03/30/19 1510 03/31/19 0408  WBC 16.6* 18.4*  HGB 8.6* 8.2*  8.1*  HCT 26.3* 24.0*  25.3*  MCV 87.1 89.1  PLT 256 387   Basic Metabolic Panel Recent Labs    03/29/19 1541 03/29/19 1552 03/30/19 0255 03/30/19 0546 03/30/19 1510 03/31/19 0408  NA 131*   < > 132*   < > 132* 133*  134*  K 3.7   < > 3.6   < > 4.1 4.3  4.4  CL 92*   < > 96*   < > 97* 99  CO2 20*   < > 22   < > 23 23  GLUCOSE 175*   < > 163*   < > 151* 182*  BUN 172*   < > 130*   < > 97* 70*  CREATININE 4.43*   < > 3.47*   < > 2.73* 2.32*  CALCIUM 7.5*   < > 7.5*   < > 7.4* 7.8*  MG  --   --  2.4  --   --  2.5*  PHOS 8.5*  --   --   --  4.6  --    < > = values in this interval not displayed.   Liver Function Tests Recent Labs    03/30/19 0255 03/30/19 0255 03/30/19 1510 03/31/19 0408  AST  47*  --   --  47*  ALT 24  --   --  24  ALKPHOS 62  --   --  65  BILITOT 0.6  --   --  0.7  PROT 5.4*  --   --  5.7*  ALBUMIN 2.0*   < > 2.0* 2.0*   < > = values in this interval not displayed.   No results for input(s): LIPASE, AMYLASE in the last 72 hours. Cardiac Enzymes No results for input(s): CKTOTAL, CKMB, CKMBINDEX, TROPONINI in the last 72 hours.  BNP: BNP (last 3 results) Recent Labs    03/22/19 0401 03/26/19 0626 03/27/19 1255  BNP 340.4* 687.5* 800.4*    ProBNP (last 3 results) No results for input(s): PROBNP in the last 8760 hours.   D-Dimer No results for input(s): DDIMER in the last 72 hours. Hemoglobin A1C No results for input(s): HGBA1C in the last 72 hours. Fasting Lipid Panel No results for input(s): CHOL, HDL, LDLCALC, TRIG, CHOLHDL, LDLDIRECT in the last 72 hours. Thyroid Function Tests No results for input(s): TSH, T4TOTAL, T3FREE, THYROIDAB in the last 72 hours.  Invalid input(s): FREET3  Other results:   Imaging    DG Chest Port 1 View  Result Date: 03/31/2019 CLINICAL DATA:  Respiratory failure. Atrial  fibrillation with rapid ventricular response. EXAM: PORTABLE CHEST 1 VIEW COMPARISON:  Chest x-rays dated 03/30/2019 and 03/29/2019. FINDINGS: Swan-Ganz catheter has been removed. Remaining support apparatus appears appropriately position. Persistent bibasilar opacities, LEFT greater than RIGHT, likely small pleural effusions and/or atelectasis. Superimposed pneumonia cannot be excluded at the LEFT lung base. No pneumothorax seen. IMPRESSION: Persistent bibasilar opacities, LEFT greater than RIGHT, most likely small pleural effusions and/or atelectasis. Superimposed pneumonia cannot be excluded at the LEFT lung base. Electronically Signed   By: Franki Cabot M.D.   On: 03/31/2019 09:52   DG Abd Portable 1V  Result Date: 03/30/2019 CLINICAL DATA:  NG tube placement EXAM: PORTABLE ABDOMEN - 1 VIEW COMPARISON:  03/30/2019 FINDINGS: Feeding tube is in place with the tip at the ligament of Treitz. Nonobstructive bowel gas pattern. IMPRESSION: Feeding tube tip in the distal duodenum near the ligament of Treitz. Electronically Signed   By: Rolm Baptise M.D.   On: 03/30/2019 18:25     Medications:     Scheduled Medications: . aspirin  81 mg Per Tube Daily  . atorvastatin  80 mg Per Tube q1800  . B-complex with vitamin C  1 tablet Oral Daily  . bisacodyl  10 mg Oral Daily   Or  . bisacodyl  10 mg Rectal Daily  . chlorhexidine gluconate (MEDLINE KIT)  15 mL Mouth Rinse BID  . Chlorhexidine Gluconate Cloth  6 each Topical Daily  . docusate  200 mg Per Tube Daily  . feeding supplement (PRO-STAT SUGAR FREE 64)  60 mL Per Tube TID  . insulin aspart  0-24 Units Subcutaneous Q4H  . insulin glargine  18 Units Subcutaneous Daily  . mouth rinse  15 mL Mouth Rinse 10 times per day  . pantoprazole sodium  40 mg Per Tube Q24H  . sodium chloride flush  10-40 mL Intracatheter Q12H  . tamsulosin  0.4 mg Oral Daily    Infusions: .  prismasol BGK 4/2.5 400 mL/hr at 03/31/19 0430  .  prismasol BGK 4/2.5 200  mL/hr at 03/29/19 1317  . sodium chloride    . sodium chloride 10 mL/hr at 03/31/19 1200  . sodium chloride 10 mL/hr at 03/29/19 2128  .  amiodarone 60 mg/hr (03/31/19 1224)  . cefTAZidime (FORTAZ)  IV Stopped (03/31/19 1147)  . dexmedetomidine (PRECEDEX) IV infusion Stopped (03/31/19 5883)  . epinephrine 3 mcg/min (03/31/19 1200)  . feeding supplement (VITAL 1.5 CAL) 1,000 mL (03/30/19 1758)  . impella catheter heparin 25 unit/mL in dextrose 5%    . heparin Stopped (03/31/19 1044)  . norepinephrine (LEVOPHED) Adult infusion 10 mcg/min (03/31/19 1200)  . prismasol BGK 4/2.5 1,500 mL/hr at 03/31/19 1029    PRN Medications: sodium chloride, acetaminophen, dextrose, docusate, fentaNYL (SUBLIMAZE) injection, heparin, heparin, ipratropium-albuterol, midazolam, ondansetron (ZOFRAN) IV, sodium chloride flush, sodium chloride flush    Assessment/Plan    1. Acute systolic HF -> Cardiogenic shock - post-op echo on 03/20/19 EF 25-30% (pre-op 30-35%. In 12/2017 EF normal) - Remains on Impella support P-6 Waveforms ok  - CO-OX 69%. Now CVVHD.  - On epi 3 mcg, NE 10 - Likely vasodilated with CVVHD and Impella. Add VP.  - Volume status improving with CVVHD - He remains critically ill. Hemodynamically improved with full support with impella and dual pressors. Remains markedly volume overloaded.Continue CVVHD. Add VP. Once fluid off can work on Public librarian and pressors  2. CAD s/p CABG this admit (LIMA->LAD, SVG->OM, SVG->RCA on 03/15/19) - No current s/s angina - Continue ASA/statin  3. Acute hypoxic respiratory failure with Pseudomonas PNA - s/p trach on 03/27/19 - Continue Fortaze - CCM managing  4. PAF - s/p MAZE and LAA occlusion - In/out of AF - continue IV amio but will cut back to 30 mg per hour.  - on heparin. No bleeding. Discussed dosing with PharmD personally.  5. AKI  - due to shock/ATN -Creatinine peaked at 5.04 - Started [facility] 03/29/19 per Nephrology.  -  Continue  D/w Dr. [place] Bells in CCM.   CRITICAL CARE Performed by: Glori Bickers  Total critical care time: 40 minutes  Critical care time was exclusive of separately billable procedures and treating other patients.  Critical care was necessary to treat or prevent imminent or life-threatening deterioration.  Critical care was time spent personally by me (independent of midlevel providers or residents) on the following activities: development of treatment plan with patient and/or surrogate as well as nursing, discussions with consultants, evaluation of patient's response to treatment, examination of patient, obtaining history from patient or surrogate, ordering and performing treatments and interventions, ordering and review of laboratory studies, ordering and review of radiographic studies, pulse oximetry and re-evaluation of patient's condition.    Length of Stay: 17  Glori Bickers, MD  03/31/2019, 12:26 PM  Advanced Heart Failure Team Pager [phone] (M-F; 7a - 4p)  Please contact [facility] for night-coverage after hours (4p -7a ) and weekends on [email or web address]

## 2019-03-31 NOTE — Progress Notes (Signed)
CT surgery p.m. rounds  Minimal bloody irrigation from Foley catheter Blood pressure improved after 1 unit blood transfusion and low-dose vasopressin.  Epinephrine weaned off Back in atrial fibrillation, remains on IV amiodarone.    Blood pressure 105/78, pulse (!) 42, temperature 97.7 F (36.5 C), temperature source Oral, resp. rate (!) 22, height 5\' 8"  (1.727 m), weight 102.7 kg, SpO2 98 %.

## 2019-03-31 NOTE — Progress Notes (Signed)
ANTICOAGULATION CONSULT NOTE   Pharmacy Consult for anticoagulation management/assistance  Indication: Impella 5.5  No Known Allergies  Patient Measurements: Height: 5\' 8"  (172.7 cm) Weight: 226 lb 6.6 oz (102.7 kg) IBW/kg (Calculated) : 68.4 Heparin Dosing Weight: 86kg  Vital Signs: Temp: 97.7 F (36.5 C) (02/13 1646) Temp Source: Oral (02/13 1646) BP: 105/78 (02/13 1630) Pulse Rate: 42 (02/13 1630)  Labs: Recent Labs    03/29/19 0417 03/29/19 0425 03/29/19 0942 03/29/19 1541 03/30/19 1510 03/30/19 1510 03/31/19 0408 03/31/19 1510  HGB 8.8*   < >  --    < > 8.6*   < > 8.2*  8.1* 8.7*  HCT 26.7*   < >  --    < > 26.3*  --  24.0*  25.3* 27.7*  PLT 238  --   --    < > 256  --  265 187  APTT 176*  --  126*  --   --   --   --   --   HEPARINUNFRC 0.34  --   --    < > 0.34  --  0.41 <0.10*  CREATININE 5.04*  --   --    < > 2.73*  --  2.32* 2.03*   < > = values in this interval not displayed.    Estimated Creatinine Clearance: 29.2 mL/min (A) (by C-G formula based on SCr of 2.03 mg/dL (H)).  Assessment: 86 year old male s/p CABG 1/29 with MAZE and LAA. Patient taken to OR 2/9 for tracheostomy and impella 5.5 placement.  Patient currently receiving heparin purge 25 units/ml (1/2 strength)    Purge is heparin half/strength (25 units/ml) infusing ~13.5 ml/h (~340 units/h) + 700 units/h systemic heparin. Systemic heparin was paused x4h per RN due to profuse penile bleeding. Heparin level is undetectable but systemic heparin has only been infusing for ~2h.    Goal of Therapy:  Heparin level ~0.3 units/mL per Dr. VT Monitor platelets by anticoagulation protocol: Yes   Plan:   Continue 1/2 strength heparin purge per MD Continue heparin 700 units/h Recheck heparin level with morning labs   Arrie Senate, PharmD, BCPS Clinical Pharmacist [phone] Please check AMION for all [facility] numbers 03/31/2019

## 2019-03-31 NOTE — Progress Notes (Signed)
Patient complains of urinary urgency, pain in bladder and penis, and is restless in the bed.  RN gave pain medicine, requested flomax (patient's home med), and also bladder scanned patient. There was 408cc in his bladder. Communicated with MD regarding the concerns.  Orders to place new foley.    When placing new foley (caude), no urine noted. Foley was irrigated, some resistance was met but it flushed. When I attempted to pull the foley out, frank dark red blood was excreted. I held pulling the foley and irrigated about 80 cc. Blood and clots were expelled. He also continued to bleed dark red blood around his foley.   MD came to assess patient at bedside. RN instructed to leave foley in place and irrigate it every 4 hours with 50 cc of saline.  RN will continue to monitor patient.

## 2019-03-31 NOTE — Progress Notes (Signed)
Patient ID: Jesse Murphy, male   DOB: 1928/06/11, 86 y.o.   MRN: [id number]  [facility] Progress Note   Assessment/ Plan:   1.  Acute kidney injury: Progressive acute kidney injury during this hospitalization compounded by postoperative CHF/cardiogenic shock that was refractory to diuretics.  Continue CRRT for volume unloading to CVP target.  Foley catheter discontinued due to discomfort, will restart tamsulosin. 2.  Acute systolic congestive heart failure with cardiogenic shock: Left ventricular assist device/Impella support for postoperative CHF, continue current CRRT prescription for UF. 3.  Acute hypoxic respiratory failure: Primarily volume mediated, continue ultrafiltration with CRRT to assist with weaning. 4.  Coronary artery disease status post three-vessel CABG 5.  Paroxysmal atrial fibrillation: Status post Maze procedure and clipping of atrial appendage, on intravenous amiodarone and currently back in atrial fibrillation. 6.  Hyponatremia: Secondary to CHF exacerbation, gradually improving with CRRT/UF  Subjective:   CRRT without problems overnight, ultrafiltration goal readjusted for hypotension associated with A. fib with RVR.  He is having some problems with penile/pelvic discomfort.   Objective:   BP 127/72   Pulse 86   Temp 97.7 F (36.5 C) (Oral)   Resp (!) 29   Ht '5\' 8"'  (1.727 m)   Wt 102.7 kg   SpO2 94%   BMI 34.43 kg/m   Intake/Output Summary (Last 24 hours) at 03/31/2019 0736 Last data filed at 03/31/2019 0700 Gross per 24 hour  Intake 4028.51 ml  Output 7383 ml  Net -3354.49 ml   Weight change: -3 kg  Physical Exam: Gen: On ventilator via tracheostomy, appears uncomfortable CVS: Pulse irregular tachycardia, S1 and S2 normal Resp: Anteriorly clear to auscultation, no distinct rales.  Dressing over sternotomy site Abd: Soft, obese, nontender Ext: 1-2+ lower extremity edema, 1+ dependent edema  Imaging: DG Chest 1 View  Result Date:  03/29/2019 CLINICAL DATA:  Follow-up Impella device EXAM: CHEST  1 VIEW COMPARISON:  03/29/2019 FINDINGS: Tracheostomy tube, gastric catheter and right-sided PICC line are again noted and stable in satisfactory position. Impella and Swan-Ganz catheter is well as postoperative changes are again seen and stable. Vascular congestion is again noted as well as bilateral effusions relatively stable from the prior exam. No new focal infiltrate is seen. IMPRESSION: Tubes and lines as described above stable from the prior exam. Stable vascular congestion and pleural effusions. Electronically Signed   By: Inez Catalina M.D.   On: 03/29/2019 12:35   DG Chest Port 1 View  Result Date: 03/30/2019 CLINICAL DATA:  Tracheostomy, I LVAD EXAM: PORTABLE CHEST 1 VIEW COMPARISON:  Portable exam 0537 hours compared to 03/29/2019 FINDINGS: Tracheostomy tube, nasogastric tube, dual lumen LEFT subclavian line, RIGHT jugular Swan-Ganz catheter, and RIGHT-side Impella device unchanged. 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 Bibasilar pleural effusions and atelectasis. Upper lungs clear. No pneumothorax. IMPRESSION: Bibasilar pleural effusions and atelectasis. Stable support devices. Electronically Signed   By: Lavonia Dana M.D.   On: 03/30/2019 08:41   DG Abd Portable 1V  Result Date: 03/30/2019 CLINICAL DATA:  NG tube placement EXAM: PORTABLE ABDOMEN - 1 VIEW COMPARISON:  03/30/2019 FINDINGS: Feeding tube is in place with the tip at the ligament of Treitz. Nonobstructive bowel gas pattern. IMPRESSION: Feeding tube tip in the distal duodenum near the ligament of Treitz. Electronically Signed   By: Rolm Baptise M.D.   On: 03/30/2019 18:25   DG Abd Portable 1V  Result Date: 03/30/2019 CLINICAL DATA:  Feeding tube placement EXAM: PORTABLE ABDOMEN -  1 VIEW COMPARISON:  03/27/2019 FINDINGS: Lung bases with persistent effusions. Partial imaging of Swan-Ganz catheter, Impella device and left atrial clipping  changes. Nasogastric tube in the proximal to mid stomach, side port below expected location the GE junction. Feeding tube in the distal stomach and or pyloric channel. Pacer leads and external leads project over the patient as before Scattered loops of gas-filled small bowel are present. Spinal degenerative changes without acute bone process. IMPRESSION: Interval placement of feeding tube tip in the antropyloric stomach. Chest findings better illustrated on chest x-ray acquired earlier the same date. Nasogastric tube remains in place. Nonspecific bowel gas pattern could reflect mild ileus. Electronically Signed   By: Zetta Bills M.D.   On: 03/30/2019 11:53    Labs: [employer] Recent Labs  Lab 03/26/19 0626 03/26/19 0649 03/26/19 1437 03/27/19 0514 03/27/19 1600 03/27/19 1621 03/28/19 0417 03/28/19 0432 03/28/19 1532 03/28/19 1532 03/28/19 1539 03/28/19 1539 03/29/19 0417 03/29/19 0417 03/29/19 0425 03/29/19 1541 03/29/19 1552 03/30/19 0255 03/30/19 0546 03/30/19 1510 03/31/19 0408  NA 135   < > 136   < > 134*   < > 133*   < > 131*   < > 130*   < > 130*   < > 129* 131* 128* 132* 134* 132* 133*  134*  K 3.9   < > 4.1   < > 3.8   < > 3.8   < > 3.7   < > 3.7   < > 3.7   < > 3.6 3.7 3.6 3.6 3.2* 4.1 4.3  4.4  CL 96*   < > 91*   < > 95*   < > 93*   < > 91*   < > 91*  --  92*  --   --  92* 91* 96*  --  97* 99  CO2 25   < > 25   < > 21*   < > 21*  --  20*  --   --   --  20*  --   --  20*  --  22  --  23 23  GLUCOSE 287*   < > 196*   < > 164*   < > 124*   < > 180*   < > 174*  --  196*  --   --  175* 171* 163*  --  151* 182*  BUN 135*   < > 149*   < > 166*   < > 168*   < > 177*   < > >130*  --  191*  --   --  172* >130* 130*  --  97* 70*  CREATININE 3.20*   < > 3.41*   < > 4.15*   < > 4.43*   < > 4.61*   < > 4.80*  --  5.04*  --   --  4.43* 4.80* 3.47*  --  2.73* 2.32*  CALCIUM 7.3*   < > 7.8*   < > 7.4*   < > 7.6*  --  7.5*  --   --   --  7.5*  --   --  7.5*  --  7.5*  --  7.4* 7.8*  PHOS  6.8*  --  6.3*  --  7.9*  --   --   --  8.1*  --   --   --   --   --   --  8.5*  --   --   --  4.6  --    < > =  values in this interval not displayed.   CBC Recent Labs  Lab 03/29/19 0417 03/29/19 0425 03/30/19 0255 03/30/19 0546 03/30/19 1510 03/31/19 0408  WBC 14.8*  --  14.4*  --  16.6* 18.4*  HGB 8.8*   < > 8.6* 7.8* 8.6* 8.2*  8.1*  HCT 26.7*   < > 26.0* 23.0* 26.3* 24.0*  25.3*  MCV 86.4  --  85.8  --  87.1 89.1  PLT 238  --  230  --  256 265   < > = values in this interval not displayed.    Medications:    . aspirin  81 mg Per Tube Daily  . atorvastatin  80 mg Per Tube q1800  . B-complex with vitamin C  1 tablet Oral Daily  . bethanechol  5 mg Per Tube TID  . bisacodyl  10 mg Oral Daily   Or  . bisacodyl  10 mg Rectal Daily  . chlorhexidine gluconate (MEDLINE KIT)  15 mL Mouth Rinse BID  . Chlorhexidine Gluconate Cloth  6 each Topical Daily  . docusate  200 mg Per Tube Daily  . feeding supplement (PRO-STAT SUGAR FREE 64)  60 mL Per Tube TID  . insulin aspart  0-24 Units Subcutaneous Q4H  . insulin glargine  18 Units Subcutaneous Daily  . mouth rinse  15 mL Mouth Rinse 10 times per day  . pantoprazole sodium  40 mg Per Tube Q24H  . sodium chloride flush  10-40 mL Intracatheter Q12H   Elmarie Shiley, MD 03/31/2019, 7:36 AM

## 2019-03-31 NOTE — Progress Notes (Signed)
4 Days Post-Op Procedure(s) (LRB): TRACHEOSTOMY placed using Shiley 8DCT Cuffed. (N/A) Placement Of Impella Left Ventricular Assist Device using ABIOMED Impella 5.5 with SmartAssist Device. (Right) Subjective: NSR this am with improved hemodynamics Foley removal resulted in patient discomfort, increased bladder scan vol with possible clots and some urethral bleeding - will replace new foley CVVH working well Pseudomonas pneumonia on  Heparin at 700u/hr- needs 1 unit PRBC Objective: Vital signs in last 24 hours: Temp:  [97.7 F (36.5 C)-97.9 F (36.6 C)] 97.9 F (36.6 C) (02/13 0800) Pulse Rate:  [37-120] 78 (02/13 0830) Cardiac Rhythm: Normal sinus rhythm (02/13 0800) Resp:  [11-38] 24 (02/13 0830) BP: (85-136)/(58-101) 124/67 (02/13 0830) SpO2:  [79 %-100 %] 97 % (02/13 0830) Arterial Line BP: (85-141)/(49-69) 118/61 (02/13 0830) FiO2 (%):  [40 %] 40 % (02/13 0800) Weight:  [102.7 kg] 102.7 kg (02/13 0500)  Hemodynamic parameters for last 24 hours: PAP: (44-59)/(13-26) 50/20 CVP:  [4 mmHg-19 mmHg] 12 mmHg  Intake/Output from previous day: 02/12 0701 - 02/13 0700 In: 4028.5 [I.V.:2276.9; NG/GT:1206; IV Piggyback:216.3] Out: 7483 [Urine:48; Emesis/NG output:350] Intake/Output this shift: Total I/O In: 531 [I.V.:243.4; Other:27.6; NG/GT:260] Out: 679 [Other:679]  EXAM responsive on vent Lungs clear Edema improving   Lab Results: Recent Labs    03/30/19 1510 03/31/19 0408  WBC 16.6* 18.4*  HGB 8.6* 8.2*  8.1*  HCT 26.3* 24.0*  25.3*  PLT 256 265   BMET:  Recent Labs    03/30/19 1510 03/31/19 0408  NA 132* 133*  134*  K 4.1 4.3  4.4  CL 97* 99  CO2 23 23  GLUCOSE 151* 182*  BUN 97* 70*  CREATININE 2.73* 2.32*  CALCIUM 7.4* 7.8*    PT/INR: No results for input(s): LABPROT, INR in the last 72 hours. ABG    Component Value Date/Time   PHART 7.391 03/31/2019 0408   HCO3 22.8 03/31/2019 0408   TCO2 24 03/31/2019 0408   ACIDBASEDEF 2.0 03/31/2019  0408   O2SAT 75.3 03/31/2019 0425   CBG (last 3)  Recent Labs    03/30/19 2329 03/31/19 0405 03/31/19 0801  GLUCAP 158* 194* 166*    Assessment/Plan: S/P Procedure(s) (LRB): TRACHEOSTOMY placed using Shiley 8DCT Cuffed. (N/A) Placement Of Impella Left Ventricular Assist Device using ABIOMED Impella 5.5 with SmartAssist Device. (Right) cont impella, CVVH   LOS: 17 days    Jesse Murphy 03/31/2019

## 2019-03-31 NOTE — Progress Notes (Signed)
ANTICOAGULATION CONSULT NOTE   Pharmacy Consult for anticoagulation management/assistance  Indication: Impella 5.5  No Known Allergies  Patient Measurements: Height: 5\' 8"  (172.7 cm) Weight: 226 lb 6.6 oz (102.7 kg) IBW/kg (Calculated) : 68.4 Heparin Dosing Weight: 86kg  Vital Signs: Temp: 97.4 F (36.3 C) (02/13 0950) Temp Source: Oral (02/13 0930) BP: 115/64 (02/13 0930) Pulse Rate: 95 (02/13 0950)  Labs: Recent Labs    03/28/19 1532 03/28/19 1539 03/29/19 0417 03/29/19 0425 03/29/19 0942 03/29/19 1541 03/30/19 0255 03/30/19 0255 03/30/19 0546 03/30/19 0546 03/30/19 1510 03/31/19 0408  HGB  --    < > 8.8*   < >  --    < > 8.6*   < > 7.8*   < > 8.6* 8.2*  8.1*  HCT  --    < > 26.7*   < >  --    < > 26.0*   < > 23.0*  --  26.3* 24.0*  25.3*  PLT  --   --  238   < >  --   --  230  --   --   --  256 265  APTT 59*  --  176*  --  126*  --   --   --   --   --   --   --   HEPARINUNFRC <0.10*   < > 0.34  --   --    < > 0.34  --   --   --  0.34 0.41  CREATININE 4.61*   < > 5.04*  --   --    < > 3.47*  --   --   --  2.73* 2.32*   < > = values in this interval not displayed.    Estimated Creatinine Clearance: 25.6 mL/min (A) (by C-G formula based on SCr of 2.32 mg/dL (H)).  Assessment: 86 year old male s/p CABG 1/29 with MAZE and LAA. Patient taken to OR 2/9 for tracheostomy and impella 5.5 placement.  Patient currently receiving heparin purge 25 units/ml (1/2 strength)    Purge is heparin half/strength (25 units/ml) infusing ~13.22ml/hr =345 units/h. Heparin drip 750 uts/hr total heparin 1095uts/hr HL 0.4, h/h stable but bleeding from penis after foley removed 2/12.   If systemic heparin >1000 units/hr (10 units/kg) needed, will need to discuss with MD.   Goal of Therapy:  Heparin level ~0.3 units/mL per Dr. VT Monitor platelets by anticoagulation protocol: Yes   Plan:   Continue 1/2 strength heparin purge per MD Decrease heparin gtt 700 units/hr Q12H heparin  level Will continue to follow along with surgery  Bonnita Nasuti Pharm.D. CPP, BCPS Clinical Pharmacist [phone] 03/31/2019 10:02 AM

## 2019-03-31 NOTE — Progress Notes (Signed)
NAME:  Jesse Murphy, MRN:  [id number], DOB:  February 21, 1928, LOS: 34 ADMISSION DATE:  03/13/2019, CONSULTATION DATE:  2/2 REFERRING MD:  Nils Pyle, CHIEF COMPLAINT:  Dyspnea   Brief History   86 y/o male had a CABG and L atrial appendage clipping on 1/29, moved to ICU on 2/1 for intubation.  Past Medical History  A fib, CAD, Prostate cancer, HTN  Significant Hospital Events   1/29 CABG 2/01 to ICU 2/05 Extubated 2/08 Reintubated 2/09 tracheostomy 2/09 Impella, PA catheter placed 2/11 Start CRRT  Consults:  Nephrology CHF team  Procedures:  Rt PICC 2/02 >> Trach 2/09 >> PA catheter 2/09 >> Lt [place] HD cath 2/11 >>  Significant Diagnostic Tests:  Echo 1/26 >> EF 30 to 35%  Micro Data:  SARS CoV2 PCR 1/26 >> negative Influenza PCR 1/26 >> negative Sputum 2/01 >> oral flora Sputum 2/08 >> Pseudomonas  Antimicrobials:  Rocephin 2/01 >> 2/07 Cefepime 2/08 >> 2/10 Tressie Ellis 2/10 >>   Interim history/subjective:  SvO2 75% Received PRBC yesterday Has a lot of pain in his penis Bladder scan> 400cc  Objective   Blood pressure 115/64, pulse 75, temperature (!) 97.4 F (36.3 C), temperature source Oral, resp. rate (!) 30, height 5\' 8"  (1.727 m), weight 102.7 kg, SpO2 97 %. PAP: (44-59)/(13-26) 50/20 CVP:  [4 mmHg-19 mmHg] 12 mmHg  Vent Mode: PRVC FiO2 (%):  [40 %] 40 % Set Rate:  [18 bmp] 18 bmp Vt Set:  [540 mL] 540 mL PEEP:  [5 cmH20] 5 cmH20 Pressure Support:  [10 cmH20] 10 cmH20 Plateau Pressure:  [16 cmH20-21 cmH20] 21 cmH20   Intake/Output Summary (Last 24 hours) at 03/31/2019 0941 Last data filed at 03/31/2019 0900 Gross per 24 hour  Intake 3851.7 ml  Output 7736 ml  Net -3884.3 ml   Filed Weights   03/29/19 0500 03/30/19 0400 03/31/19 0500  Weight: 104.3 kg 105.7 kg 102.7 kg    Examination:  General:  In bed on vent HENT: NCAT tracheostomy in place PULM: Wheezing bilaterally B, vent supported breathing CV: RRR, no mgr GI: BS+, soft, nontender MSK:  normal bulk and tone Derm: edema arms/legs Neuro: awake on vent  2/13 CXR > bilateral airspace disease and effusions  Resolved Hospital Problem list     Assessment & Plan:  Acute hypoxemic respiratory failure due to acute pulmonary edema complicated by HCAP Smoker, wheezing, COPD? > ceftaz 7 days > needs fluid removal as much as tolerated > trach care per routine > wean to pressure support today > add prn duoneb today > doubt he will be able to liberate from vent until more fluid removed  Cardiogenic shock with acute systolic heart failure > Epinephrine/levophed and impella per heart failure > Would favor volume removal  Atrial fibrillation > Amiodarone > tele > heparin infusion  AKI from cardiogenic shock > CVVHD per renal > Foley per renal/primary service > Monitor BMET and UOP > Replace electrolytes as needed  Anemia of critical illness > Monitor for bleeding > Transfuse PRBC for Hgb < 7 gm/dL  DM2 with hyperglycemia, poorly controlled > SSI  Acute metabolic encephalopathy due to shock, renal failure, hypoxemia Minimize sedation precedex RASS goal 0  Pressure wounds WOC   Best practice:  Diet: tube feedgin Pain/Anxiety/Delirium protocol (if indicated): RASS goal> adjust to 0 VAP protocol (if indicated): yes DVT prophylaxis: heparin infusion GI prophylaxis: Pantoprazole for stress ulcer prophylaxis Glucose control: SSI, glargine Mobility: bed rest Code Status: full Family Communication: per Primary  service Disposition: remain in ICU  Labs   CBC: Recent Labs  Lab 03/28/19 0417 03/28/19 0432 03/29/19 0417 03/29/19 0425 03/29/19 1552 03/30/19 0255 03/30/19 0546 03/30/19 1510 03/31/19 0408  WBC 21.0*  --  14.8*  --   --  14.4*  --  16.6* 18.4*  HGB 9.7*   < > 8.8*   < > 10.9* 8.6* 7.8* 8.6* 8.2*  8.1*  HCT 29.5*   < > 26.7*   < > 32.0* 26.0* 23.0* 26.3* 24.0*  25.3*  MCV 86.3  --  86.4  --   --  85.8  --  87.1 89.1  PLT 285  --  238  --    --  230  --  256 265   < > = values in this interval not displayed.    Basic Metabolic Panel: Recent Labs  Lab 03/26/19 0626 03/26/19 0649 03/26/19 1437 03/27/19 0514 03/27/19 1600 03/27/19 1621 03/28/19 1532 03/28/19 1539 03/29/19 0417 03/29/19 0425 03/29/19 1541 03/29/19 1541 03/29/19 1552 03/30/19 0255 03/30/19 0546 03/30/19 1510 03/31/19 0408  NA 135   < > 136   < > 134*   < > 131*   < > 130*   < > 131*   < > 128* 132* 134* 132* 133*  134*  K 3.9   < > 4.1   < > 3.8   < > 3.7   < > 3.7   < > 3.7   < > 3.6 3.6 3.2* 4.1 4.3  4.4  CL 96*   < > 91*   < > 95*   < > 91*   < > 92*   < > 92*  --  91* 96*  --  97* 99  CO2 25   < > 25   < > 21*   < > 20*   < > 20*  --  20*  --   --  22  --  23 23  GLUCOSE 287*   < > 196*   < > 164*   < > 180*   < > 196*   < > 175*  --  171* 163*  --  151* 182*  BUN 135*   < > 149*   < > 166*   < > 177*   < > 191*   < > 172*  --  >130* 130*  --  97* 70*  CREATININE 3.20*   < > 3.41*   < > 4.15*   < > 4.61*   < > 5.04*   < > 4.43*  --  4.80* 3.47*  --  2.73* 2.32*  CALCIUM 7.3*   < > 7.8*   < > 7.4*   < > 7.5*   < > 7.5*  --  7.5*  --   --  7.5*  --  7.4* 7.8*  MG 2.2  --   --   --   --   --   --   --   --   --   --   --   --  2.4  --   --  2.5*  PHOS 6.8*   < > 6.3*  --  7.9*  --  8.1*  --   --   --  8.5*  --   --   --   --  4.6  --    < > = values in this interval not displayed.   GFR: Estimated Creatinine Clearance: 25.6 mL/min (A) (  by C-G formula based on SCr of 2.32 mg/dL (H)). Recent Labs  Lab 03/26/19 0626 03/26/19 1027 03/27/19 0514 03/27/19 1305 03/27/19 1605 03/28/19 0417 03/29/19 0417 03/30/19 0255 03/30/19 1510 03/31/19 0408  WBC 14.6*  --    < >  --   --    < > 14.8* 14.4* 16.6* 18.4*  LATICACIDVEN 0.9 1.0  --  1.0 0.9  --   --   --   --   --    < > = values in this interval not displayed.    Liver Function Tests: Recent Labs  Lab 03/27/19 1255 03/27/19 1600 03/28/19 0417 03/28/19 1532 03/29/19 0417 03/29/19 1541  03/30/19 0255 03/30/19 1510 03/31/19 0408  AST 85*  --  73*  --  52*  --  47*  --  47*  ALT 81*  --  63*  --  32  --  24  --  24  ALKPHOS 55  --  67  --  57  --  62  --  65  BILITOT 0.5  --  1.0  --  0.6  --  0.6  --  0.7  PROT 5.8*  --  5.6*  --  5.5*  --  5.4*  --  5.7*  ALBUMIN 2.6*   < > 2.4*   < > 2.1* 2.2* 2.0* 2.0* 2.0*   < > = values in this interval not displayed.   No results for input(s): LIPASE, AMYLASE in the last 168 hours. No results for input(s): AMMONIA in the last 168 hours.  ABG    Component Value Date/Time   PHART 7.391 03/31/2019 0408   PCO2ART 37.5 03/31/2019 0408   PO2ART 66.0 (L) 03/31/2019 0408   HCO3 22.8 03/31/2019 0408   TCO2 24 03/31/2019 0408   ACIDBASEDEF 2.0 03/31/2019 0408   O2SAT 75.3 03/31/2019 0425     Coagulation Profile: Recent Labs  Lab 03/27/19 1255  INR 1.7*    Cardiac Enzymes: No results for input(s): CKTOTAL, CKMB, CKMBINDEX, TROPONINI in the last 168 hours.  HbA1C: Hgb A1c MFr Bld  Date/Time Value Ref Range Status  03/13/2019 12:51 PM 6.1 (H) 4.8 - 5.6 % Final    Comment:    (NOTE) Pre diabetes:          5.7%-6.4% Diabetes:              >6.4% Glycemic control for   <7.0% adults with diabetes   09/12/2018 09:36 AM 5.9 (H) 4.8 - 5.6 % Final    Comment:    (NOTE) Pre diabetes:          5.7%-6.4% Diabetes:              >6.4% Glycemic control for   <7.0% adults with diabetes     CBG: Recent Labs  Lab 03/30/19 1515 03/30/19 2018 03/30/19 2329 03/31/19 0405 03/31/19 0801  GLUCAP 143* 135* 158* 194* 166*       Critical care time: 35 minutes     Roselie Awkward, MD [facility] Pager: [phone] Cell: [phone] If no response, call [phone]

## 2019-04-01 ENCOUNTER — Inpatient Hospital Stay (HOSPITAL_COMMUNITY): Payer: Medicare Other

## 2019-04-01 LAB — POCT I-STAT 7, (LYTES, BLD GAS, ICA,H+H)
Acid-Base Excess: 2 mmol/L (ref 0.0–2.0)
Bicarbonate: 27.4 mmol/L (ref 20.0–28.0)
Calcium, Ion: 1.13 mmol/L — ABNORMAL LOW (ref 1.15–1.40)
HCT: 24 % — ABNORMAL LOW (ref 39.0–52.0)
Hemoglobin: 8.2 g/dL — ABNORMAL LOW (ref 13.0–17.0)
O2 Saturation: 93 %
Patient temperature: 97.9
Potassium: 4.4 mmol/L (ref 3.5–5.1)
Sodium: 134 mmol/L — ABNORMAL LOW (ref 135–145)
TCO2: 29 mmol/L (ref 22–32)
pCO2 arterial: 42.5 mmHg (ref 32.0–48.0)
pH, Arterial: 7.415 (ref 7.350–7.450)
pO2, Arterial: 64 mmHg — ABNORMAL LOW (ref 83.0–108.0)

## 2019-04-01 LAB — RENAL FUNCTION PANEL
Albumin: 2 g/dL — ABNORMAL LOW (ref 3.5–5.0)
Anion gap: 10 (ref 5–15)
BUN: 47 mg/dL — ABNORMAL HIGH (ref 8–23)
CO2: 22 mmol/L (ref 22–32)
Calcium: 7.6 mg/dL — ABNORMAL LOW (ref 8.9–10.3)
Chloride: 102 mmol/L (ref 98–111)
Creatinine, Ser: 1.79 mg/dL — ABNORMAL HIGH (ref 0.61–1.24)
GFR calc Af Amer: 38 mL/min — ABNORMAL LOW (ref >60)
GFR calc non Af Amer: 33 mL/min — ABNORMAL LOW (ref >60)
Glucose, Bld: 200 mg/dL — ABNORMAL HIGH (ref 70–99)
Phosphorus: 2.3 mg/dL — ABNORMAL LOW (ref 2.5–4.6)
Potassium: 4.4 mmol/L (ref 3.5–5.1)
Sodium: 134 mmol/L — ABNORMAL LOW (ref 135–145)

## 2019-04-01 LAB — GLUCOSE, CAPILLARY
Glucose-Capillary: 145 mg/dL — ABNORMAL HIGH (ref 70–99)
Glucose-Capillary: 160 mg/dL — ABNORMAL HIGH (ref 70–99)
Glucose-Capillary: 170 mg/dL — ABNORMAL HIGH (ref 70–99)
Glucose-Capillary: 185 mg/dL — ABNORMAL HIGH (ref 70–99)
Glucose-Capillary: 187 mg/dL — ABNORMAL HIGH (ref 70–99)
Glucose-Capillary: 189 mg/dL — ABNORMAL HIGH (ref 70–99)

## 2019-04-01 LAB — COMPREHENSIVE METABOLIC PANEL
ALT: 20 U/L (ref 0–44)
AST: 35 U/L (ref 15–41)
Albumin: 2 g/dL — ABNORMAL LOW (ref 3.5–5.0)
Alkaline Phosphatase: 66 U/L (ref 38–126)
Anion gap: 10 (ref 5–15)
BUN: 49 mg/dL — ABNORMAL HIGH (ref 8–23)
CO2: 24 mmol/L (ref 22–32)
Calcium: 7.9 mg/dL — ABNORMAL LOW (ref 8.9–10.3)
Chloride: 100 mmol/L (ref 98–111)
Creatinine, Ser: 1.89 mg/dL — ABNORMAL HIGH (ref 0.61–1.24)
GFR calc Af Amer: 36 mL/min — ABNORMAL LOW (ref >60)
GFR calc non Af Amer: 31 mL/min — ABNORMAL LOW (ref >60)
Glucose, Bld: 187 mg/dL — ABNORMAL HIGH (ref 70–99)
Potassium: 4.5 mmol/L (ref 3.5–5.1)
Sodium: 134 mmol/L — ABNORMAL LOW (ref 135–145)
Total Bilirubin: 0.6 mg/dL (ref 0.3–1.2)
Total Protein: 5.5 g/dL — ABNORMAL LOW (ref 6.5–8.1)

## 2019-04-01 LAB — MAGNESIUM: Magnesium: 2.5 mg/dL — ABNORMAL HIGH (ref 1.7–2.4)

## 2019-04-01 LAB — CBC
HCT: 25.4 % — ABNORMAL LOW (ref 39.0–52.0)
HCT: 24.7 % — ABNORMAL LOW (ref 39.0–52.0)
HCT: 31.5 % — ABNORMAL LOW (ref 39.0–52.0)
Hemoglobin: 8 g/dL — ABNORMAL LOW (ref 13.0–17.0)
Hemoglobin: 7.6 g/dL — ABNORMAL LOW (ref 13.0–17.0)
Hemoglobin: 10.2 g/dL — ABNORMAL LOW (ref 13.0–17.0)
MCH: 28.5 pg (ref 26.0–34.0)
MCH: 28.1 pg (ref 26.0–34.0)
MCH: 29 pg (ref 26.0–34.0)
MCHC: 31.5 g/dL (ref 30.0–36.0)
MCHC: 30.8 g/dL (ref 30.0–36.0)
MCHC: 32.4 g/dL (ref 30.0–36.0)
MCV: 90.4 fL (ref 80.0–100.0)
MCV: 91.5 fL (ref 80.0–100.0)
MCV: 89.5 fL (ref 80.0–100.0)
Platelets: 171 10*3/uL (ref 150–400)
Platelets: 178 10*3/uL (ref 150–400)
Platelets: 169 10*3/uL (ref 150–400)
RBC: 2.81 MIL/uL — ABNORMAL LOW (ref 4.22–5.81)
RBC: 2.7 MIL/uL — ABNORMAL LOW (ref 4.22–5.81)
RBC: 3.52 MIL/uL — ABNORMAL LOW (ref 4.22–5.81)
RDW: 16 % — ABNORMAL HIGH (ref 11.5–15.5)
RDW: 16.2 % — ABNORMAL HIGH (ref 11.5–15.5)
RDW: 15.3 % (ref 11.5–15.5)
WBC: 22 10*3/uL — ABNORMAL HIGH (ref 4.0–10.5)
WBC: 24.9 10*3/uL — ABNORMAL HIGH (ref 4.0–10.5)
WBC: 24.9 10*3/uL — ABNORMAL HIGH (ref 4.0–10.5)
nRBC: 0.1 % (ref 0.0–0.2)
nRBC: 0.1 % (ref 0.0–0.2)
nRBC: 0.2 % (ref 0.0–0.2)

## 2019-04-01 LAB — COOXEMETRY PANEL
Carboxyhemoglobin: 1.1 % (ref 0.5–1.5)
Methemoglobin: 0.5 % (ref 0.0–1.5)
O2 Saturation: 67.6 %
Total hemoglobin: 9.4 g/dL — ABNORMAL LOW (ref 12.0–16.0)

## 2019-04-01 LAB — PREPARE RBC (CROSSMATCH)

## 2019-04-01 LAB — POCT ACTIVATED CLOTTING TIME: Activated Clotting Time: 131 seconds

## 2019-04-01 LAB — HEPARIN LEVEL (UNFRACTIONATED)
Heparin Unfractionated: 0.3 IU/mL (ref 0.30–0.70)
Heparin Unfractionated: <0.1 IU/mL — ABNORMAL LOW (ref 0.30–0.70)

## 2019-04-01 LAB — LACTATE DEHYDROGENASE: LDH: 255 U/L — ABNORMAL HIGH (ref 98–192)

## 2019-04-01 MED ORDER — SODIUM CHLORIDE 0.9 % IV SOLN
50.0000 mg | Freq: Once | INTRAVENOUS | Status: AC
  Administered 2019-04-01: 50 mg via INTRAVENOUS
  Filled 2019-04-01: qty 1

## 2019-04-01 MED ORDER — PANTOPRAZOLE SODIUM 40 MG IV SOLR
40.0000 mg | INTRAVENOUS | Status: DC
  Administered 2019-04-01 – 2019-05-03 (×33): 40 mg via INTRAVENOUS
  Filled 2019-04-01 (×33): qty 40

## 2019-04-01 MED ORDER — INSULIN GLARGINE 100 UNIT/ML ~~LOC~~ SOLN
22.0000 [IU] | Freq: Every day | SUBCUTANEOUS | Status: DC
  Administered 2019-04-01 – 2019-04-02 (×2): 22 [IU] via SUBCUTANEOUS
  Filled 2019-04-01 (×2): qty 0.22

## 2019-04-01 MED ORDER — VITAL 1.5 CAL PO LIQD
1000.0000 mL | ORAL | Status: DC
  Administered 2019-04-01 – 2019-04-02 (×2): 1000 mL
  Filled 2019-04-01 (×4): qty 1000

## 2019-04-01 MED ORDER — HEPARIN BOLUS VIA INFUSION (CRRT)
1000.0000 [IU] | INTRAVENOUS | Status: DC | PRN
  Filled 2019-04-01: qty 1000

## 2019-04-01 MED ORDER — DEXMEDETOMIDINE HCL IN NACL 400 MCG/100ML IV SOLN: 0.0000 ug/kg/h | INTRAVENOUS | Status: DC

## 2019-04-01 MED ORDER — SODIUM CHLORIDE 0.9% IV SOLUTION: Freq: Once | INTRAVENOUS | Status: AC

## 2019-04-01 MED ORDER — IPRATROPIUM-ALBUTEROL 0.5-2.5 (3) MG/3ML IN SOLN: 3.0000 mL | Freq: Four times a day (QID) | RESPIRATORY_TRACT | Status: DC

## 2019-04-01 MED ORDER — AMIODARONE LOAD VIA INFUSION
150.0000 mg | Freq: Once | INTRAVENOUS | Status: AC
  Administered 2019-04-01: 150 mg via INTRAVENOUS
  Filled 2019-04-01 (×4): qty 83.34

## 2019-04-01 MED ORDER — SODIUM CHLORIDE 0.9 % IV SOLN
250.0000 [IU]/h | INTRAVENOUS | Status: DC
  Administered 2019-04-01: 250 [IU]/h via INTRAVENOUS_CENTRAL
  Administered 2019-04-02: 1150 [IU]/h via INTRAVENOUS_CENTRAL
  Filled 2019-04-01 (×4): qty 2

## 2019-04-01 NOTE — Progress Notes (Signed)
NAME:  Jesse Murphy, MRN:  [id number], DOB:  11-Dec-1928, LOS: 67 ADMISSION DATE:  03/13/2019, CONSULTATION DATE:  2/2 REFERRING MD:  Nils Pyle, CHIEF COMPLAINT:  Dyspnea   Brief History   86 y/o male had a CABG and L atrial appendage clipping on 1/29, moved to ICU on 2/1 for intubation.  Past Medical History  A fib, CAD, Prostate cancer, HTN  Significant Hospital Events   1/29 CABG 2/01 to ICU 2/05 Extubated 2/08 Reintubated 2/09 tracheostomy 2/09 Impella, PA catheter placed 2/11 Start CRRT  Consults:  Nephrology CHF team  Procedures:  Rt PICC 2/02 >> Trach 2/09 >> PA catheter 2/09 >> Lt [place] HD cath 2/11 >>  Significant Diagnostic Tests:  Echo 1/26 >> EF 30 to 35%  Micro Data:  SARS CoV2 PCR 1/26 >> negative Influenza PCR 1/26 >> negative Sputum 2/01 >> oral flora Sputum 2/08 >> Pseudomonas  Antimicrobials:  Rocephin 2/01 >> 2/07 Cefepime 2/08 >> 2/10 Tressie Ellis 2/10 >>   Interim history/subjective:   Had a brady event while attempting pressure support ventilation this morning Foley catheter replaced  Objective   Blood pressure 96/62, pulse 79, temperature 98.4 F (36.9 C), temperature source Oral, resp. rate 18, height 5\' 8"  (1.727 m), weight 101.9 kg, SpO2 100 %. CVP:  [7 mmHg-48 mmHg] 9 mmHg  Vent Mode: PRVC FiO2 (%):  [40 %] 40 % Set Rate:  [18 bmp] 18 bmp Vt Set:  [510 mL-540 mL] 510 mL PEEP:  [5 cmH20] 5 cmH20 Pressure Support:  [8 cmH20] 8 cmH20 Plateau Pressure:  [14 cmH20-20 cmH20] 16 cmH20   Intake/Output Summary (Last 24 hours) at 04/01/2019 2505 Last data filed at 04/01/2019 0900 Gross per 24 hour  Intake 4340.05 ml  Output 5657 ml  Net -1316.95 ml   Filed Weights   03/30/19 0400 03/31/19 0500 04/01/19 0500  Weight: 105.7 kg 102.7 kg 101.9 kg    Examination:  General:  In bed on vent HENT: NCAT ETT in place PULM: CTA B, vent supported breathing CV: RRR, no mgr GI: BS+, soft, nontender MSK: normal bulk and tone Neuro: sedated on  vent  2/14 CXR > hazy bilateral airspace disease and effusions  Resolved Hospital Problem list     Assessment & Plan:  Acute hypoxemic respiratory failure due to acute pulmonary edema complicated by HCAP Smoker, wheezing, likely COPD? Plan ceftaz 7 days Continue to remove as much fluid as tolerated Trach care per routine Pressure support > attempt daily to bid Schedule duoneb  Cardiogenic shock with acute systolic heart failure Wean off epinephrine/levophed per heart failure service Impella per heart failure service  Atrial fibrillation Amiodarone Tele Heparin infusion  AKI from cardiogenic shock Monitor BMET and UOP Replace electrolytes as needed CVVHD per renal  Anemia of critical illness Monitor for bleeding Transfuse PRBC for Hgb < 7 gm/dL  DM2 with hyperglycemia, poorly controlled SSI  Acute metabolic encephalopathy due to shock, renal failure, hypoxemia RASS goal 0 Minimize sedation  Pressure wounds WOC  Best practice:  Diet: tube feedgin Pain/Anxiety/Delirium protocol (if indicated): RASS goal> adjust to 0 VAP protocol (if indicated): yes DVT prophylaxis: heparin infusion GI prophylaxis: Pantoprazole for stress ulcer prophylaxis Glucose control: SSI, glargine Mobility: bed rest Code Status: full Family Communication: per Primary service Disposition: remain in ICU  Labs   CBC: Recent Labs  Lab 03/30/19 0255 03/30/19 0546 03/30/19 1510 03/31/19 0408 03/31/19 1510 04/01/19 0400 04/01/19 0455  WBC 14.4*  --  16.6* 18.4* 18.2* 22.0*  --  HGB 8.6*   < > 8.6* 8.2*  8.1* 8.7* 8.0* 8.2*  HCT 26.0*   < > 26.3* 24.0*  25.3* 27.7* 25.4* 24.0*  MCV 85.8  --  87.1 89.1 89.9 90.4  --   PLT 230  --  256 265 187 171  --    < > = values in this interval not displayed.    Basic Metabolic Panel: Recent Labs  Lab 03/26/19 0626 03/26/19 0649 03/27/19 1600 03/27/19 1621 03/28/19 1532 03/28/19 1539 03/29/19 1541 03/29/19 1552 03/30/19 0255  03/30/19 0546 03/30/19 1510 03/31/19 0408 03/31/19 1510 04/01/19 0400 04/01/19 0455  NA 135   < > 134*   < > 131*   < > 131*   < > 132*   < > 132* 133*  134* 134* 134* 134*  K 3.9   < > 3.8   < > 3.7   < > 3.7   < > 3.6   < > 4.1 4.3  4.4 4.1 4.5 4.4  CL 96*   < > 95*   < > 91*   < > 92*   < > 96*  --  97* 99 100 100  --   CO2 25   < > 21*   < > 20*   < > 20*   < > 22  --  23 23 22 24   --   GLUCOSE 287*   < > 164*   < > 180*   < > 175*   < > 163*  --  151* 182* 194* 187*  --   BUN 135*   < > 166*   < > 177*   < > 172*   < > 130*  --  97* 70* 58* 49*  --   CREATININE 3.20*   < > 4.15*   < > 4.61*   < > 4.43*   < > 3.47*  --  2.73* 2.32* 2.03* 1.89*  --   CALCIUM 7.3*   < > 7.4*   < > 7.5*   < > 7.5*   < > 7.5*  --  7.4* 7.8* 7.7* 7.9*  --   MG 2.2  --   --   --   --   --   --   --  2.4  --   --  2.5*  --  2.5*  --   PHOS 6.8*   < > 7.9*  --  8.1*  --  8.5*  --   --   --  4.6  --  3.3  --   --    < > = values in this interval not displayed.   GFR: Estimated Creatinine Clearance: 31.3 mL/min (A) (by C-G formula based on SCr of 1.89 mg/dL (H)). Recent Labs  Lab 03/26/19 0626 03/26/19 1027 03/27/19 0514 03/27/19 1305 03/27/19 1605 03/28/19 0417 03/30/19 1510 03/31/19 0408 03/31/19 1510 04/01/19 0400  WBC 14.6*  --    < >  --   --    < > 16.6* 18.4* 18.2* 22.0*  LATICACIDVEN 0.9 1.0  --  1.0 0.9  --   --   --   --   --    < > = values in this interval not displayed.    Liver Function Tests: Recent Labs  Lab 03/28/19 0417 03/28/19 1532 03/29/19 0417 03/29/19 1541 03/30/19 0255 03/30/19 1510 03/31/19 0408 03/31/19 1510 04/01/19 0400  AST 73*  --  52*  --  47*  --  47*  --  35  ALT 63*  --  32  --  24  --  24  --  20  ALKPHOS 67  --  57  --  62  --  65  --  66  BILITOT 1.0  --  0.6  --  0.6  --  0.7  --  0.6  PROT 5.6*  --  5.5*  --  5.4*  --  5.7*  --  5.5*  ALBUMIN 2.4*   < > 2.1*   < > 2.0* 2.0* 2.0* 2.0* 2.0*   < > = values in this interval not displayed.   No  results for input(s): LIPASE, AMYLASE in the last 168 hours. No results for input(s): AMMONIA in the last 168 hours.  ABG    Component Value Date/Time   PHART 7.415 04/01/2019 0455   PCO2ART 42.5 04/01/2019 0455   PO2ART 64.0 (L) 04/01/2019 0455   HCO3 27.4 04/01/2019 0455   TCO2 29 04/01/2019 0455   ACIDBASEDEF 2.0 03/31/2019 0408   O2SAT 93.0 04/01/2019 0455     Coagulation Profile: Recent Labs  Lab 03/27/19 1255  INR 1.7*    Cardiac Enzymes: No results for input(s): CKTOTAL, CKMB, CKMBINDEX, TROPONINI in the last 168 hours.  HbA1C: Hgb A1c MFr Bld  Date/Time Value Ref Range Status  03/13/2019 12:51 PM 6.1 (H) 4.8 - 5.6 % Final    Comment:    (NOTE) Pre diabetes:          5.7%-6.4% Diabetes:              >6.4% Glycemic control for   <7.0% adults with diabetes   09/12/2018 09:36 AM 5.9 (H) 4.8 - 5.6 % Final    Comment:    (NOTE) Pre diabetes:          5.7%-6.4% Diabetes:              >6.4% Glycemic control for   <7.0% adults with diabetes     CBG: Recent Labs  Lab 03/31/19 1515 03/31/19 2050 04/01/19 0052 04/01/19 0409 04/01/19 0737  GLUCAP 169* 164* 160* 187* 145*       Critical care time: 35 minutes     Roselie Awkward, MD [facility] Pager: [phone] Cell: [phone] If no response, call [phone]

## 2019-04-01 NOTE — Progress Notes (Signed)
Pt placed back on full support due to drop in BP, paleness, and blank stare (per RN)

## 2019-04-01 NOTE — Progress Notes (Signed)
5 Days Post-Op Procedure(s) (LRB): TRACHEOSTOMY placed using Shiley 8DCT Cuffed. (N/A) Placement Of Impella Left Ventricular Assist Device using ABIOMED Impella 5.5 with SmartAssist Device. (Right) Subjective: Removing fluid and improving uremia with CVVH, -1 L balance yesterday Patient with evidence of bleeding from GI tract and within bladder.  We will reduce peripheral IV heparin, monitor CBC and change Protonix to IV Patient is in atrial fibrillation today  Objective: Vital signs in last 24 hours: Temp:  [97.7 F (36.5 C)-99.3 F (37.4 C)] 99.3 F (37.4 C) (02/14 1205) Pulse Rate:  [37-107] 81 (02/14 1200) Cardiac Rhythm: Atrial fibrillation (02/14 0800) Resp:  [0-34] 23 (02/14 1200) BP: (82-133)/(58-96) 99/58 (02/14 1200) SpO2:  [96 %-100 %] 99 % (02/14 1200) Arterial Line BP: (65-134)/(48-72) 100/48 (02/14 1200) FiO2 (%):  [40 %] 40 % (02/14 1156) Weight:  [101.9 kg] 101.9 kg (02/14 0500)  Hemodynamic parameters for last 24 hours: CVP:  [7 mmHg-48 mmHg] 8 mmHg  Intake/Output from previous day: 02/13 0701 - 02/14 0700 In: 4561.9 [I.V.:1816.2; Blood:382.5; NG/GT:1620; IV Piggyback:186.7] Out: 5986 [Urine:710] Intake/Output this shift: Total I/O In: 960.2 [I.V.:314.3; Other:228.1; NG/GT:217.7; IV Piggyback:200.1] Out: 1838 [Urine:125; Emesis/NG output:50; KMMNO:1771]  Exam Patient opens eyes and is engaged General 2-3+ edema Breath sounds diminished at the left base Sternal incision, trach incision clean Abdomen nontender  Lab Results: Recent Labs    03/31/19 1510 03/31/19 1510 04/01/19 0400 04/01/19 0455  WBC 18.2*  --  22.0*  --   HGB 8.7*   < > 8.0* 8.2*  HCT 27.7*   < > 25.4* 24.0*  PLT 187  --  171  --    < > = values in this interval not displayed.   BMET:  Recent Labs    03/31/19 1510 03/31/19 1510 04/01/19 0400 04/01/19 0455  NA 134*   < > 134* 134*  K 4.1   < > 4.5 4.4  CL 100  --  100  --   CO2 22  --  24  --   GLUCOSE 194*  --  187*  --    BUN 58*  --  49*  --   CREATININE 2.03*  --  1.89*  --   CALCIUM 7.7*  --  7.9*  --    < > = values in this interval not displayed.    PT/INR: No results for input(s): LABPROT, INR in the last 72 hours. ABG    Component Value Date/Time   PHART 7.415 04/01/2019 0455   HCO3 27.4 04/01/2019 0455   TCO2 29 04/01/2019 0455   ACIDBASEDEF 2.0 03/31/2019 0408   O2SAT 93.0 04/01/2019 0455   CBG (last 3)  Recent Labs    04/01/19 0409 04/01/19 0737 04/01/19 1111  GLUCAP 187* 145* 189*    Assessment/Plan: S/P Procedure(s) (LRB): TRACHEOSTOMY placed using Shiley 8DCT Cuffed. (N/A) Placement Of Impella Left Ventricular Assist Device using ABIOMED Impella 5.5 with SmartAssist Device. (Right) Continue with Impella for hemodynamic support and CRRT for fluid removal Evidence of clinical bleeding will require reduction in heparin dosing for the Impella catheter and atrial fibrillation   LOS: 18 days    Jesse Murphy 04/01/2019

## 2019-04-01 NOTE — Progress Notes (Signed)
CT surgery p.m. Rounds  Peripheral heparin on hold for GI bleed and bladder bleed 2 units packed cells being transfused this evening with follow-up CBC Blood pressure improved with transfusion. Attempt at continuous bladder irrigation unsuccessful due to inability to pass the three-way catheter We will leave bladder drained with 16 French Foley catheter Plan on resuming peripheral heparin in a.m.

## 2019-04-01 NOTE — Progress Notes (Signed)
Patient ID: Jesse Murphy, male   DOB: 1928-08-18, 86 y.o.   MRN: [id number]  [facility] Progress Note   Assessment/ Plan:   1.  Acute kidney injury: Progressive acute kidney injury during this hospitalization compounded by postoperative CHF/cardiogenic shock that was refractory to diuretics.  Continue CRRT for volume unloading; CVP today at 10.  2.  Acute systolic congestive heart failure with cardiogenic shock: Left ventricular assist device/Impella support for postoperative CHF, continue current CRRT prescription for UF. 3.  Acute hypoxic respiratory failure: Primarily volume mediated, continue ultrafiltration with CRRT to assist with weaning. 4.  Coronary artery disease status post three-vessel CABG 5.  Paroxysmal atrial fibrillation: Status post Maze procedure and clipping of atrial appendage, on intravenous amiodarone and currently back in atrial fibrillation. 6.  Hyponatremia: Secondary to CHF exacerbation, appears stable at 134 on CRRT.  Subjective:   Tolerated CRRT overnight without problems, improving urinary symptoms with tamsulosin.   Objective:   BP 96/62   Pulse 80   Temp 98.4 F (36.9 C) (Oral)   Resp 18   Ht _0  (1.727 m)   Wt 101.9 kg   SpO2 100%   BMI 34.16 kg/m   Intake/Output Summary (Last 24 hours) at 04/01/2019 0809 Last data filed at 04/01/2019 0800 Gross per 24 hour  Intake 4290.02 ml  Output 6008 ml  Net -1717.98 ml   Weight change: -0.8 kg  Physical Exam: Gen: On ventilator via tracheostomy, sitting comfortably in bed CVS: Pulse irregularly irregular, normal rate, S1 and S2 normal Resp: Anteriorly clear to auscultation, no distinct rales.  Dressing over sternotomy site Abd: Soft, obese, nontender Ext: Bilateral lower extremities in booties, trace-1+ lower extremity edema with 1+ dependent edema  Imaging: DG Chest Port 1 View  Result Date: 03/31/2019 CLINICAL DATA:  Respiratory failure. Atrial fibrillation with rapid ventricular  response. EXAM: PORTABLE CHEST 1 VIEW COMPARISON:  Chest x-rays dated 03/30/2019 and 03/29/2019. FINDINGS: Swan-Ganz catheter has been removed. Remaining support apparatus appears appropriately position. Persistent bibasilar opacities, LEFT greater than RIGHT, likely small pleural effusions and/or atelectasis. Superimposed pneumonia cannot be excluded at the LEFT lung base. No pneumothorax seen. IMPRESSION: Persistent bibasilar opacities, LEFT greater than RIGHT, most likely small pleural effusions and/or atelectasis. Superimposed pneumonia cannot be excluded at the LEFT lung base. Electronically Signed   By: Franki Cabot M.D.   On: 03/31/2019 09:52   DG Abd Portable 1V  Result Date: 03/30/2019 CLINICAL DATA:  NG tube placement EXAM: PORTABLE ABDOMEN - 1 VIEW COMPARISON:  03/30/2019 FINDINGS: Feeding tube is in place with the tip at the ligament of Treitz. Nonobstructive bowel gas pattern. IMPRESSION: Feeding tube tip in the distal duodenum near the ligament of Treitz. Electronically Signed   By: Rolm Baptise M.D.   On: 03/30/2019 18:25   DG Abd Portable 1V  Result Date: 03/30/2019 CLINICAL DATA:  Feeding tube placement EXAM: PORTABLE ABDOMEN - 1 VIEW COMPARISON:  03/27/2019 FINDINGS: Lung bases with persistent effusions. Partial imaging of Swan-Ganz catheter, Impella device and left atrial clipping changes. Nasogastric tube in the proximal to mid stomach, side port below expected location the GE junction. Feeding tube in the distal stomach and or pyloric channel. Pacer leads and external leads project over the patient as before Scattered loops of gas-filled small bowel are present. Spinal degenerative changes without acute bone process. IMPRESSION: Interval placement of feeding tube tip in the antropyloric stomach. Chest findings better illustrated on chest x-ray acquired earlier the same date. Nasogastric tube remains  in place. Nonspecific bowel gas pattern could reflect mild ileus. Electronically Signed    By: Zetta Bills M.D.   On: 03/30/2019 11:53    Labs: [employer] Recent Labs  Lab 03/26/19 0626 03/26/19 0649 03/26/19 1437 03/27/19 0514 03/27/19 1600 03/27/19 1621 03/28/19 1532 03/28/19 1539 03/29/19 0417 03/29/19 0425 03/29/19 1541 03/29/19 1541 03/29/19 1552 03/29/19 1552 03/30/19 0255 03/30/19 0546 03/30/19 1510 03/31/19 0408 03/31/19 1510 04/01/19 0400 04/01/19 0455  NA 135   < > 136   < > 134*   < > 131*   < > 130*   < > 131*   < > 128*   < > 132* 134* 132* 133*  134* 134* 134* 134*  K 3.9   < > 4.1   < > 3.8   < > 3.7   < > 3.7   < > 3.7   < > 3.6   < > 3.6 3.2* 4.1 4.3  4.4 4.1 4.5 4.4  CL 96*   < > 91*   < > 95*   < > 91*   < > 92*   < > 92*  --  91*  --  96*  --  97* 99 100 100  --   CO2 25   < > 25   < > 21*   < > 20*   < > 20*  --  20*  --   --   --  22  --  _0 --   GLUCOSE 287*   < > 196*   < > 164*   < > 180*   < > 196*   < > 175*  --  171*  --  163*  --  151* 182* 194* 187*  --   BUN 135*   < > 149*   < > 166*   < > 177*   < > 191*   < > 172*  --  >130*  --  130*  --  97* 70* 58* 49*  --   CREATININE 3.20*   < > 3.41*   < > 4.15*   < > 4.61*   < > 5.04*   < > 4.43*  --  4.80*  --  3.47*  --  2.73* 2.32* 2.03* 1.89*  --   CALCIUM 7.3*   < > 7.8*   < > 7.4*   < > 7.5*   < > 7.5*  --  7.5*  --   --   --  7.5*  --  7.4* 7.8* 7.7* 7.9*  --   PHOS 6.8*  --  6.3*  --  7.9*  --  8.1*  --   --   --  8.5*  --   --   --   --   --  4.6  --  3.3  --   --    < > = values in this interval not displayed.   CBC Recent Labs  Lab 03/30/19 1510 03/30/19 1510 03/31/19 0408 03/31/19 1510 04/01/19 0400 04/01/19 0455  WBC 16.6*  --  18.4* 18.2* 22.0*  --   HGB 8.6*   < > 8.2*  8.1* 8.7* 8.0* 8.2*  HCT 26.3*   < > 24.0*  25.3* 27.7* 25.4* 24.0*  MCV 87.1  --  89.1 89.9 90.4  --   PLT 256  --  265 187 171  --    < > = values in this interval not displayed.  Medications:    . aspirin  81 mg Per Tube Daily  . atorvastatin  80 mg Per Tube q1800  .  B-complex with vitamin C  1 tablet Oral Daily  . bisacodyl  10 mg Oral Daily   Or  . bisacodyl  10 mg Rectal Daily  . chlorhexidine gluconate (MEDLINE KIT)  15 mL Mouth Rinse BID  . Chlorhexidine Gluconate Cloth  6 each Topical Daily  . docusate  200 mg Per Tube Daily  . feeding supplement (PRO-STAT SUGAR FREE 64)  60 mL Per Tube TID  . insulin aspart  0-24 Units Subcutaneous Q4H  . insulin glargine  18 Units Subcutaneous Daily  . mouth rinse  15 mL Mouth Rinse 10 times per day  . pantoprazole sodium  40 mg Per Tube Q24H  . sodium chloride flush  10-40 mL Intracatheter Q12H  . tamsulosin  0.4 mg Oral Daily   Elmarie Shiley, MD 04/01/2019, 8:09 AM

## 2019-04-01 NOTE — Progress Notes (Signed)
[place] for anticoagulation management/assistance  Indication: Impella 5.5  No Known Allergies  Patient Measurements: Height: 5\' 8"  (172.7 cm) Weight: 224 lb 10.4 oz (101.9 kg) IBW/kg (Calculated) : 68.4 Heparin Dosing Weight: 86kg  Vital Signs: Temp: 99.3 F (37.4 C) (02/14 1205) Temp Source: Oral (02/14 1205) BP: 99/58 (02/14 1200) Pulse Rate: 81 (02/14 1200)  Labs: Recent Labs    03/31/19 0408 03/31/19 0408 03/31/19 1510 03/31/19 1510 04/01/19 0400 04/01/19 0455  HGB 8.2*  8.1*   < > 8.7*   < > 8.0* 8.2*  HCT 24.0*  25.3*   < > 27.7*  --  25.4* 24.0*  PLT 265  --  187  --  171  --   HEPARINUNFRC 0.41  --  <0.10*  --  0.30  --   CREATININE 2.32*  --  2.03*  --  1.89*  --    < > = values in this interval not displayed.    Estimated Creatinine Clearance: 31.3 mL/min (A) (by C-G formula based on SCr of 1.89 mg/dL (H)).  Assessment: 86 year old male s/p CABG 1/29 with MAZE and LAA. Patient taken to OR 2/9 for tracheostomy and impella 5.5 placement.  Patient currently receiving heparin purge 25 units/ml (1/2 strength)    Purge is heparin half/strength (25 units/ml) infusing ~13.5 ml/h (~340 units/h) + 700units/h systemic heparin total 1040uts/hr. HL at goal 0.3 this am but s/p some bleeding from foley> heparin dropped to 400 uts/hr.  Melena stool - checking CBC now and turning systemic heparin off.  Will follow up later today   Goal of Therapy:  Heparin level ~0.3 units/mL per Dr. VT Monitor platelets by anticoagulation protocol: Yes   Plan:   Continue 1/2 strength heparin purge per MD   Bonnita Nasuti Pharm.D. CPP, BCPS Clinical Pharmacist [phone] 04/01/2019 12:38 PM    Please check AMION for all [place] numbers 04/01/2019

## 2019-04-01 NOTE — Plan of Care (Signed)
Afebrile. A + O x 4, following commands. Can mouth words and use written communication to make needs known. Received Amio bolus this morning for ongoing Afib. Patient Converted to NSR post bolus and has remained in NSR. Pressors titrated to achieve MAP>65. Vaso gtt increased per order. Patient had total of 2 bloody/dark red, loose BMs. MD Prescott Gum and Bensimhon notified. Systemic Heparin stopped. Hgb 8.2-->7.6, patient received 2 u PRBC this shift. Coude catheter with bloody output, attempted to irrigate and became clogged. Per Prescott Gum, attempted to do 3 way coude CBI but unsuccessful. Per Prescott Gum- ok to place 2 way coude catheter and do intermittent irrigations. CRRT filter clotted off bout 1630, unable to return blood so filter changed. Per Prescott Gum- Nephrology was called re: adding Heparin to CRRT circuit.  Son at bedside all afternoon. Patient and son updated on plan of care.   Problem: Coping: Goal: Level of anxiety will decrease 04/01/2019 1816 by Radonna Ricker, RN Outcome: Progressing 04/01/2019 1815 by Radonna Ricker, RN Outcome: Progressing   Problem: Education: Goal: Knowledge of disease or condition will improve Outcome: Progressing   Problem: Cardiac: Goal: Ability to achieve and maintain adequate cardiopulmonary perfusion will improve Outcome: Progressing   Problem: Respiratory: Goal: Patent airway maintenance will improve Outcome: Progressing   Problem: Education: Goal: Knowledge of disease and its progression will improve Outcome: Progressing

## 2019-04-01 NOTE — Progress Notes (Signed)
Patient had large dark red loose stool. Patient had subsequently drop in CO and CI, and increase need for pressure support. MD Prescott Gum and Bensimhon notified. Heparin gtt stopped. Labs ordered and drawn.

## 2019-04-01 NOTE — Progress Notes (Signed)
Advanced Heart Failure Rounding Note  PCP-Cardiologist: Kate Sable, MD   Subjective:    Remains on impella 5.5 support, P-6, Flow 4 CPO 1.2   Started CVVHD 2/11. UF remains at -200 but has been limited by low BP. Weight down another 2 pounds.   On vent through trach. Respiratory culture with pseudomonas on Fortaz  In/out AF on Amio 60 mg per hour.   Still with blood in Foley.   Remains weak. Denies SOB, CP.   Off EPi. On VP 0.03 and  NE 8 CO-OX 67%.  Flo-trak: CO/CI 5.9/2.7 SVR 732  Impella 5.5: P-6 Flow 3.9  Waveforms ok    Objective:   Weight Range: 101.9 kg Body mass index is 34.16 kg/m.   Vital Signs:   Temp:  [97.5 F (36.4 C)-98.4 F (36.9 C)] 98.4 F (36.9 C) (02/14 0729) Pulse Rate:  [28-107] 77 (02/14 1100) Resp:  [0-34] 14 (02/14 1100) BP: (82-133)/(60-101) 89/63 (02/14 1000) SpO2:  [96 %-100 %] 100 % (02/14 1100) Arterial Line BP: (65-134)/(48-72) 104/48 (02/14 1100) FiO2 (%):  [40 %] 40 % (02/14 0800) Weight:  [101.9 kg] 101.9 kg (02/14 0500) Last BM Date: 03/31/19  Weight change: Filed Weights   03/30/19 0400 03/31/19 0500 04/01/19 0500  Weight: 105.7 kg 102.7 kg 101.9 kg    Intake/Output:   Intake/Output Summary (Last 24 hours) at 04/01/2019 1110 Last data filed at 04/01/2019 1100 Gross per 24 hour  Intake 4203.56 ml  Output 6350 ml  Net -2146.44 ml      Physical Exam   General:  Elderly weak appearing. No resp difficulty HEENT: normal Neck: supple. + trach  RIJ TLC. LIJ trialysis Carotids 2+ bilat; no bruits. No lymphadenopathy or thryomegaly appreciated. Cor: PMI nondisplaced. Regular rate & rhythm. No rubs, gallops or murmurs. Lungs: clear Abdomen: soft, nontender, nondistended. No hepatosplenomegaly. No bruits or masses. Good bowel sounds. Extremities: no cyanosis, clubbing, rash, 3+ edema in upper and lower extremities. + UNNA Neuro: awake following commands. Moves all 4    Telemetry   In/ou AF <-> NSR. Now NSR  70-80s personally reviewed.   Labs    CBC Recent Labs    03/31/19 1510 03/31/19 1510 04/01/19 0400 04/01/19 0455  WBC 18.2*  --  22.0*  --   HGB 8.7*   < > 8.0* 8.2*  HCT 27.7*   < > 25.4* 24.0*  MCV 89.9  --  90.4  --   PLT 187  --  171  --    < > = values in this interval not displayed.   Basic Metabolic Panel Recent Labs    03/30/19 1510 03/30/19 1510 03/31/19 0408 03/31/19 0408 03/31/19 1510 03/31/19 1510 04/01/19 0400 04/01/19 0455  NA 132*   < > 133*  134*   < > 134*   < > 134* 134*  K 4.1   < > 4.3  4.4   < > 4.1   < > 4.5 4.4  CL 97*   < > 99   < > 100  --  100  --   CO2 23   < > 23   < > 22  --  24  --   GLUCOSE 151*   < > 182*   < > 194*  --  187*  --   BUN 97*   < > 70*   < > 58*  --  49*  --   CREATININE 2.73*   < > 2.32*   < >  2.03*  --  1.89*  --   CALCIUM 7.4*   < > 7.8*   < > 7.7*  --  7.9*  --   MG  --   --  2.5*  --   --   --  2.5*  --   PHOS 4.6  --   --   --  3.3  --   --   --    < > = values in this interval not displayed.   Liver Function Tests Recent Labs    03/31/19 0408 03/31/19 0408 03/31/19 1510 04/01/19 0400  AST 47*  --   --  35  ALT 24  --   --  20  ALKPHOS 65  --   --  66  BILITOT 0.7  --   --  0.6  PROT 5.7*  --   --  5.5*  ALBUMIN 2.0*   < > 2.0* 2.0*   < > = values in this interval not displayed.   No results for input(s): LIPASE, AMYLASE in the last 72 hours. Cardiac Enzymes No results for input(s): CKTOTAL, CKMB, CKMBINDEX, TROPONINI in the last 72 hours.  BNP: BNP (last 3 results) Recent Labs    03/22/19 0401 03/26/19 0626 03/27/19 1255  BNP 340.4* 687.5* 800.4*    ProBNP (last 3 results) No results for input(s): PROBNP in the last 8760 hours.   D-Dimer No results for input(s): DDIMER in the last 72 hours. Hemoglobin A1C No results for input(s): HGBA1C in the last 72 hours. Fasting Lipid Panel No results for input(s): CHOL, HDL, LDLCALC, TRIG, CHOLHDL, LDLDIRECT in the last 72 hours. Thyroid  Function Tests No results for input(s): TSH, T4TOTAL, T3FREE, THYROIDAB in the last 72 hours.  Invalid input(s): FREET3  Other results:   Imaging    DG Chest Port 1 View  Result Date: 04/01/2019 CLINICAL DATA:  Left ventricular assist device in place. EXAM: PORTABLE CHEST 1 VIEW COMPARISON:  03/31/2019 FINDINGS: Tracheostomy tube overlies the airway. A right jugular sheath, left subclavian catheter, right PICC, and right subclavian approach Impella device remain in place. Enteric tubes course into the abdomen with tips not imaged. Sequelae of CABG and left atrial appendage clipping are again identified. The cardiomediastinal silhouette is unchanged. Veiling opacities in the lung bases, left greater than right, suggest small pleural effusions and appear improved from the prior study although this may also reflect differences in patient positioning. There is persistent asymmetric atelectasis or consolidation in the left lung base. No pneumothorax is identified. IMPRESSION: 1. Support devices as above. 2. Pleural effusions and bibasilar atelectasis with overall improved lung aeration since yesterday. Persistent asymmetric left basilar atelectasis or consolidation. Electronically Signed   By: Logan Bores M.D.   On: 04/01/2019 09:22     Medications:     Scheduled Medications: . amiodarone  150 mg Intravenous Once  . aspirin  81 mg Per Tube Daily  . atorvastatin  80 mg Per Tube q1800  . B-complex with vitamin C  1 tablet Oral Daily  . bisacodyl  10 mg Oral Daily   Or  . bisacodyl  10 mg Rectal Daily  . chlorhexidine gluconate (MEDLINE KIT)  15 mL Mouth Rinse BID  . Chlorhexidine Gluconate Cloth  6 each Topical Daily  . docusate  200 mg Per Tube Daily  . feeding supplement (PRO-STAT SUGAR FREE 64)  60 mL Per Tube TID  . insulin aspart  0-24 Units Subcutaneous Q4H  . insulin glargine  22 Units  Subcutaneous Daily  . ipratropium-albuterol  3 mL Nebulization Q6H  . mouth rinse  15 mL Mouth  Rinse 10 times per day  . pantoprazole sodium  40 mg Per Tube Q24H  . sodium chloride flush  10-40 mL Intracatheter Q12H  . tamsulosin  0.4 mg Oral Daily    Infusions: .  prismasol BGK 4/2.5 400 mL/hr at 03/31/19 1731  .  prismasol BGK 4/2.5 200 mL/hr at 03/31/19 1743  . sodium chloride    . sodium chloride Stopped (04/01/19 1049)  . sodium chloride 10 mL/hr at 03/29/19 2128  . amiodarone 60 mg/hr (04/01/19 1100)  . cefTAZidime (FORTAZ)  IV Stopped (04/01/19 1029)  . epinephrine Stopped (03/31/19 1321)  . feeding supplement (VITAL 1.5 CAL) 1,000 mL (04/01/19 1005)  . impella catheter heparin 25 unit/mL in dextrose 5%    . heparin 700 Units/hr (04/01/19 1100)  . norepinephrine (LEVOPHED) Adult infusion 8 mcg/min (04/01/19 1100)  . prismasol BGK 4/2.5 1,500 mL/hr at 04/01/19 0946  . vasopressin (PITRESSIN) infusion - *FOR SHOCK* 0.03 Units/min (04/01/19 1100)    PRN Medications: sodium chloride, oxyCODONE **AND** acetaminophen, dextrose, docusate, fentaNYL (SUBLIMAZE) injection, heparin, heparin, midazolam, ondansetron (ZOFRAN) IV, sodium chloride flush, sodium chloride flush    Assessment/Plan    1. Acute systolic HF -> Cardiogenic shock - post-op echo on 03/20/19 EF 25-30% (pre-op 30-35%. In 12/2017 EF normal) - Remains on Impella support P-6 Waveforms ok LDH 255 - CO-OX 68%. Now CVVHD.  - Off epi. On VP 0.03 and NE 8. SVR low.Increase VP 0.04. - Likely vasodilated with CVVHD and Impella. - Continue CVVHD. Support BP to facilitate volume removal at at least -200/hr as he remains ~ 30 pounds up  - He remains critically ill. Hemodynamically improved with full support with impella and dual pressors. Remains markedly volume overloaded.Continue CVVHD. Once fluid off can work on Public librarian and pressors  2. CAD s/p CABG this admit (LIMA->LAD, SVG->OM, SVG->RCA on 03/15/19) - No current s/s angina - Continue ASA/statin  3. Acute hypoxic respiratory failure with Pseudomonas  PNA - s/p trach on 03/27/19 - Continue Fortaz - CCM managing vent (thanks!)  4. PAF - s/p MAZE and LAA occlusion - In/out of AF. Now in NSR - continue IV amio - on heparin. Has bleeding in Foley. Need to watch closely. Discussed dosing with PharmD personally.  5. AKI  - due to shock/ATN -Creatinine peaked at 5.04 - Started [facility] 03/29/19 per Nephrology.  - Weight still up 25-30 pounds. Continue to pull aggressively  Prognosis very guarded with advanced age and MSOF.   CRITICAL CARE Performed by: Glori Bickers  Total critical care time: 35 minutes  Critical care time was exclusive of separately billable procedures and treating other patients.  Critical care was necessary to treat or prevent imminent or life-threatening deterioration.  Critical care was time spent personally by me (independent of midlevel providers or residents) on the following activities: development of treatment plan with patient and/or surrogate as well as nursing, discussions with consultants, evaluation of patient's response to treatment, examination of patient, obtaining history from patient or surrogate, ordering and performing treatments and interventions, ordering and review of laboratory studies, ordering and review of radiographic studies, pulse oximetry and re-evaluation of patient's condition.    Length of Stay: [place], MD  04/01/2019, 11:10 AM  Advanced Heart Failure Team Pager [phone] (M-F; 7a - 4p)  Please contact [facility] for night-coverage after hours (4p -7a ) and weekends on [email or web address]

## 2019-04-02 ENCOUNTER — Inpatient Hospital Stay (HOSPITAL_COMMUNITY): Payer: Medicare Other

## 2019-04-02 DIAGNOSIS — Z7901 Long term (current) use of anticoagulants: Secondary | ICD-10-CM

## 2019-04-02 LAB — CBC
HCT: 30.4 % — ABNORMAL LOW (ref 39.0–52.0)
HCT: 31.4 % — ABNORMAL LOW (ref 39.0–52.0)
HCT: 29.4 % — ABNORMAL LOW (ref 39.0–52.0)
Hemoglobin: 10 g/dL — ABNORMAL LOW (ref 13.0–17.0)
Hemoglobin: 9.9 g/dL — ABNORMAL LOW (ref 13.0–17.0)
Hemoglobin: 9.4 g/dL — ABNORMAL LOW (ref 13.0–17.0)
MCH: 29.8 pg (ref 26.0–34.0)
MCH: 29.4 pg (ref 26.0–34.0)
MCH: 29 pg (ref 26.0–34.0)
MCHC: 32.9 g/dL (ref 30.0–36.0)
MCHC: 31.5 g/dL (ref 30.0–36.0)
MCHC: 32 g/dL (ref 30.0–36.0)
MCV: 90.5 fL (ref 80.0–100.0)
MCV: 93.2 fL (ref 80.0–100.0)
MCV: 90.7 fL (ref 80.0–100.0)
Platelets: 158 10*3/uL (ref 150–400)
Platelets: 146 10*3/uL — ABNORMAL LOW (ref 150–400)
Platelets: 135 10*3/uL — ABNORMAL LOW (ref 150–400)
RBC: 3.36 MIL/uL — ABNORMAL LOW (ref 4.22–5.81)
RBC: 3.37 MIL/uL — ABNORMAL LOW (ref 4.22–5.81)
RBC: 3.24 MIL/uL — ABNORMAL LOW (ref 4.22–5.81)
RDW: 15.7 % — ABNORMAL HIGH (ref 11.5–15.5)
RDW: 16.1 % — ABNORMAL HIGH (ref 11.5–15.5)
RDW: 15.9 % — ABNORMAL HIGH (ref 11.5–15.5)
WBC: 30.6 10*3/uL — ABNORMAL HIGH (ref 4.0–10.5)
WBC: 35 10*3/uL — ABNORMAL HIGH (ref 4.0–10.5)
WBC: 33.3 10*3/uL — ABNORMAL HIGH (ref 4.0–10.5)
nRBC: 0.3 % — ABNORMAL HIGH (ref 0.0–0.2)
nRBC: 0.5 % — ABNORMAL HIGH (ref 0.0–0.2)
nRBC: 0.5 % — ABNORMAL HIGH (ref 0.0–0.2)

## 2019-04-02 LAB — POCT ACTIVATED CLOTTING TIME
Activated Clotting Time: 131 seconds
Activated Clotting Time: 147 seconds
Activated Clotting Time: 153 seconds
Activated Clotting Time: 169 seconds
Activated Clotting Time: 175 seconds
Activated Clotting Time: 180 seconds
Activated Clotting Time: 180 seconds
Activated Clotting Time: 186 seconds
Activated Clotting Time: 186 seconds
Activated Clotting Time: 197 seconds

## 2019-04-02 LAB — POCT I-STAT 7, (LYTES, BLD GAS, ICA,H+H)
Acid-Base Excess: 2 mmol/L (ref 0.0–2.0)
Bicarbonate: 26.6 mmol/L (ref 20.0–28.0)
Calcium, Ion: 1.11 mmol/L — ABNORMAL LOW (ref 1.15–1.40)
HCT: 30 % — ABNORMAL LOW (ref 39.0–52.0)
Hemoglobin: 10.2 g/dL — ABNORMAL LOW (ref 13.0–17.0)
O2 Saturation: 93 %
Patient temperature: 98.2
Potassium: 4.7 mmol/L (ref 3.5–5.1)
Sodium: 134 mmol/L — ABNORMAL LOW (ref 135–145)
TCO2: 28 mmol/L (ref 22–32)
pCO2 arterial: 40.3 mmHg (ref 32.0–48.0)
pH, Arterial: 7.426 (ref 7.350–7.450)
pO2, Arterial: 66 mmHg — ABNORMAL LOW (ref 83.0–108.0)

## 2019-04-02 LAB — TYPE AND SCREEN
ABO/RH(D): B POS
Antibody Screen: NEGATIVE
Unit division: 0
Unit division: 0
Unit division: 0

## 2019-04-02 LAB — RENAL FUNCTION PANEL
Albumin: 2 g/dL — ABNORMAL LOW (ref 3.5–5.0)
Anion gap: 12 (ref 5–15)
BUN: 44 mg/dL — ABNORMAL HIGH (ref 8–23)
CO2: 23 mmol/L (ref 22–32)
Calcium: 7.5 mg/dL — ABNORMAL LOW (ref 8.9–10.3)
Chloride: 99 mmol/L (ref 98–111)
Creatinine, Ser: 1.75 mg/dL — ABNORMAL HIGH (ref 0.61–1.24)
GFR calc Af Amer: 39 mL/min — ABNORMAL LOW (ref >60)
GFR calc non Af Amer: 34 mL/min — ABNORMAL LOW (ref >60)
Glucose, Bld: 205 mg/dL — ABNORMAL HIGH (ref 70–99)
Phosphorus: 1.9 mg/dL — ABNORMAL LOW (ref 2.5–4.6)
Potassium: 4.6 mmol/L (ref 3.5–5.1)
Sodium: 134 mmol/L — ABNORMAL LOW (ref 135–145)

## 2019-04-02 LAB — BPAM RBC
Blood Product Expiration Date: 202103132359
Blood Product Expiration Date: 202103172359
Blood Product Expiration Date: 202103172359
ISSUE DATE / TIME: 202102130926
ISSUE DATE / TIME: 202102141322
ISSUE DATE / TIME: 202102141600
Unit Type and Rh: 7300
Unit Type and Rh: 7300
Unit Type and Rh: 7300

## 2019-04-02 LAB — COMPREHENSIVE METABOLIC PANEL
ALT: 20 U/L (ref 0–44)
AST: 35 U/L (ref 15–41)
Albumin: 2 g/dL — ABNORMAL LOW (ref 3.5–5.0)
Alkaline Phosphatase: 74 U/L (ref 38–126)
Anion gap: 10 (ref 5–15)
BUN: 43 mg/dL — ABNORMAL HIGH (ref 8–23)
CO2: 25 mmol/L (ref 22–32)
Calcium: 7.9 mg/dL — ABNORMAL LOW (ref 8.9–10.3)
Chloride: 101 mmol/L (ref 98–111)
Creatinine, Ser: 1.83 mg/dL — ABNORMAL HIGH (ref 0.61–1.24)
GFR calc Af Amer: 37 mL/min — ABNORMAL LOW (ref >60)
GFR calc non Af Amer: 32 mL/min — ABNORMAL LOW (ref >60)
Glucose, Bld: 188 mg/dL — ABNORMAL HIGH (ref 70–99)
Potassium: 4.3 mmol/L (ref 3.5–5.1)
Sodium: 136 mmol/L (ref 135–145)
Total Bilirubin: 0.9 mg/dL (ref 0.3–1.2)
Total Protein: 5.5 g/dL — ABNORMAL LOW (ref 6.5–8.1)

## 2019-04-02 LAB — POCT I-STAT, CHEM 8
BUN: 40 mg/dL — ABNORMAL HIGH (ref 8–23)
Calcium, Ion: 1.11 mmol/L — ABNORMAL LOW (ref 1.15–1.40)
Chloride: 99 mmol/L (ref 98–111)
Creatinine, Ser: 1.7 mg/dL — ABNORMAL HIGH (ref 0.61–1.24)
Glucose, Bld: 166 mg/dL — ABNORMAL HIGH (ref 70–99)
HCT: 29 % — ABNORMAL LOW (ref 39.0–52.0)
Hemoglobin: 9.9 g/dL — ABNORMAL LOW (ref 13.0–17.0)
Potassium: 4.2 mmol/L (ref 3.5–5.1)
Sodium: 134 mmol/L — ABNORMAL LOW (ref 135–145)
TCO2: 26 mmol/L (ref 22–32)

## 2019-04-02 LAB — COOXEMETRY PANEL
Carboxyhemoglobin: 1.4 % (ref 0.5–1.5)
Methemoglobin: 0.8 % (ref 0.0–1.5)
O2 Saturation: 71 %
Total hemoglobin: 11.3 g/dL — ABNORMAL LOW (ref 12.0–16.0)

## 2019-04-02 LAB — GLUCOSE, CAPILLARY
Glucose-Capillary: 154 mg/dL — ABNORMAL HIGH (ref 70–99)
Glucose-Capillary: 170 mg/dL — ABNORMAL HIGH (ref 70–99)
Glucose-Capillary: 173 mg/dL — ABNORMAL HIGH (ref 70–99)
Glucose-Capillary: 173 mg/dL — ABNORMAL HIGH (ref 70–99)
Glucose-Capillary: 192 mg/dL — ABNORMAL HIGH (ref 70–99)
Glucose-Capillary: 199 mg/dL — ABNORMAL HIGH (ref 70–99)
Glucose-Capillary: 206 mg/dL — ABNORMAL HIGH (ref 70–99)

## 2019-04-02 LAB — MAGNESIUM: Magnesium: 2.5 mg/dL — ABNORMAL HIGH (ref 1.7–2.4)

## 2019-04-02 LAB — HEPARIN LEVEL (UNFRACTIONATED)
Heparin Unfractionated: 0.35 IU/mL (ref 0.30–0.70)
Heparin Unfractionated: 0.42 IU/mL (ref 0.30–0.70)
Heparin Unfractionated: 0.11 IU/mL — ABNORMAL LOW (ref 0.30–0.70)

## 2019-04-02 LAB — APTT: aPTT: 156 seconds — ABNORMAL HIGH (ref 24–36)

## 2019-04-02 LAB — LACTATE DEHYDROGENASE: LDH: 247 U/L — ABNORMAL HIGH (ref 98–192)

## 2019-04-02 MED ORDER — INSULIN GLARGINE 100 UNIT/ML ~~LOC~~ SOLN
24.0000 [IU] | Freq: Every day | SUBCUTANEOUS | Status: DC
  Administered 2019-04-03: 24 [IU] via SUBCUTANEOUS
  Filled 2019-04-02: qty 0.24

## 2019-04-02 MED ORDER — AMIODARONE LOAD VIA INFUSION
150.0000 mg | Freq: Once | INTRAVENOUS | Status: AC
  Administered 2019-04-02: 150 mg via INTRAVENOUS

## 2019-04-02 MED ORDER — INSULIN ASPART 100 UNIT/ML ~~LOC~~ SOLN
3.0000 [IU] | SUBCUTANEOUS | Status: DC
  Administered 2019-04-02 – 2019-04-03 (×7): 3 [IU] via SUBCUTANEOUS

## 2019-04-02 MED ORDER — GERHARDT'S BUTT CREAM
TOPICAL_CREAM | Freq: Two times a day (BID) | CUTANEOUS | Status: DC
  Administered 2019-04-06 – 2019-05-18 (×16): 1 via TOPICAL
  Filled 2019-04-02 (×7): qty 1

## 2019-04-02 MED ORDER — HEPARIN (PORCINE) 25000 UT/250ML-% IV SOLN
650.0000 [IU]/h | INTRAVENOUS | Status: DC
  Administered 2019-04-02: 500 [IU]/h via INTRAVENOUS
  Filled 2019-04-02 (×2): qty 250

## 2019-04-02 NOTE — Consult Note (Signed)
Urology Consult   Physician requesting consult: Tharon Aquas Trigt  Reason for consult: Hematuria  History of Present Illness: Jesse Murphy is a 86 y.o. male with PMH significant for CaP s/p EBRT 2013, afib, CAD, and HTN who is s/p CABG and Maze procedure on 03/17/19 with subsequent tracheostomy and placement of Impella LVAD on 03/27/19.  He has been maintained on heparin.  Pt developed ARF post op as well as CHF/cardiogenic shock which has required CVVHD.  Per RN report pt developed gross hematuria and melana several days ago.  RNs have been able to hand irrigate the 64ffoley with return of some clots.  An unsuccessful attempt was made over the weekend to place a 20 coude 3 way catheter for possible CBIs. Foley was then replaced with a 128fhat the RNs have been irrigating q 4 hours. Pt has minimal urinary output as is expected while on HD.  Urology consult was requested to aid with irrigations.   He is resting comfortably and is awake/alert. He denies HA, CP, and abdominal pain.  CVVHD is running.   Pt has a hx of prostate cancer [facility] he has had intermittent gross hematuria for approx 1 year but he did not seek treatment for this.  He was taking Xarelto. Per care everywhere he was last evaled by Rad Onc in July 2020.  There was no mention of [facility] and PSA was 0.2.    Past Medical History:  Diagnosis Date  . [place]  . CAD (coronary artery disease)    a. s/p PCI in 1992 b. Coronary CT in 01/2018 showing extensive coronary calcification; FFR indeterminate --> medical management pursued at that time as patient not interested in repeat cath  . [facility]   [facility]state  . Dyspnea   . Hypertension     Past Surgical History:  Procedure Laterality Date  . CLIPPING OF ATRIAL APPENDAGE N/A 03/16/2019   Procedure: CLIPPING OF LEFT  ATRIAL APPENDAGE - USING ATRICLIP SIZE 40;  Surgeon: VaIvin PootMD;  [place] [facility]  Service: Open Heart Surgery;  Laterality: N/A;  . COLONOSCOPY N/A 08/25/2017   Procedure: COLONOSCOPY;  Surgeon: ReRogene HoustonMD;  Location[facility];  Service: Endoscopy;  Laterality: N/A;  . CORONARY ARTERY BYPASS GRAFT N/A 03/16/2019   Procedure: CORONARY ARTERY BYPASS GRAFTING (CABG), ON PUMP, TIMES THREE, USING LEFT INTERNAL MAMMARY ARTERY, RIGHT GREATER SAPHENOUS VEIN HARVESTED ENDOSCOPICALLY;  Surgeon: VaIvin PootMD;  [place] [facility] Service: Open Heart Surgery;  Laterality: N/A;  swan only  . HERNIA REPAIR    . MAZE N/A 03/16/2019   Procedure: MAZE;  Surgeon: VaIvin PootMD;  [place] [facility] Service: Open [facility] Surgery;  Laterality: N/A;  . PLACEMENT OF IMPELLA LEFT VENTRICULAR ASSIST DEVICE Right 03/27/2019   Procedure: Placement Of Impella Left Ventricular Assist Device using ABIOMED Impella 5.5 with SmartAssist Device.;  Surgeon: AtWonda OldsMD;  Location: [facility];  Service: Thoracic;  Laterality: Right;  . PROSTATE SURGERY    . RIGHT/LEFT HEART CATH AND CORONARY ANGIOGRAPHY N/A 03/15/2019   Procedure: RIGHT/LEFT HEART CATH AND CORONARY ANGIOGRAPHY;  Surgeon: McBurnell BlanksMD;  [facility];  Service: Cardiovascular;  Laterality: N/A;  . TEE WITHOUT CARDIOVERSION N/A 03/16/2019   Procedure: TRANSESOPHAGEAL ECHOCARDIOGRAM (TEE);  Surgeon: VaPrescott GumPeCollier SalinaMD;  [place] [facility]  Service: Open Heart Surgery;  Laterality: N/A;  . TRACHEOSTOMY TUBE PLACEMENT N/A 03/27/2019   Procedure: TRACHEOSTOMY placed using Shiley 8DCT Cuffed.;  Surgeon: Wonda Olds, MD;  Location: [facility];  Service: Thoracic;  Laterality: N/A;    Current Hospital Medications:  Home Meds:  . Current Meds  Medication Sig  . acetaminophen (TYLENOL) 325 MG tablet Take 325 mg by mouth every 8 (eight) hours as needed for moderate pain.   Marland Kitchen atorvastatin (LIPITOR) 10 MG tablet Take 10 mg by mouth.   . fluticasone (FLONASE) 50 MCG/ACT nasal spray daily as needed.    . furosemide (LASIX) 20 MG tablet Take 20 mg by mouth daily.   Marland Kitchen loratadine (CLARITIN) 10 MG tablet Take 10 mg by mouth daily.  . metoprolol tartrate (LOPRESSOR) 50 MG tablet Take 1 tablet (50 mg total) by mouth 2 (two) times daily.  . pantoprazole (PROTONIX) 40 MG tablet Take 1 tablet (40 mg total) by mouth daily.  . Pediatric Multivitamins-Iron (FLINTSTONES PLUS IRON) chewable tablet Chew 1 tablet by mouth 2 (two) times daily.  . rivaroxaban (XARELTO) 20 MG TABS tablet Take 1 tablet (20 mg total) by mouth daily with supper.  . tamsulosin (FLOMAX) 0.4 MG CAPS capsule Take by mouth.    Scheduled Meds: . aspirin  81 mg Per Tube Daily  . atorvastatin  80 mg Per Tube q1800  . B-complex with vitamin C  1 tablet Oral Daily  . bisacodyl  10 mg Oral Daily   Or  . bisacodyl  10 mg Rectal Daily  . chlorhexidine gluconate (MEDLINE KIT)  15 mL Mouth Rinse BID  . Chlorhexidine Gluconate Cloth  6 each Topical Daily  . docusate  200 mg Per Tube Daily  . feeding supplement (PRO-STAT SUGAR FREE 64)  60 mL Per Tube TID  . Gerhardt's butt cream   Topical BID  . insulin aspart  0-24 Units Subcutaneous Q4H  . insulin aspart  3 Units Subcutaneous Q4H  . [START ON 04/03/2019] insulin glargine  24 Units Subcutaneous Daily  . mouth rinse  15 mL Mouth Rinse 10 times per day  . pantoprazole (PROTONIX) IV  40 mg Intravenous Q24H  . sodium chloride flush  10-40 mL Intracatheter Q12H  . tamsulosin  0.4 mg Oral Daily   Continuous Infusions: .  prismasol BGK 4/2.5 400 mL/hr at 04/02/19 0532  .  prismasol BGK 4/2.5 200 mL/hr at 04/01/19 1642  . sodium chloride    . sodium chloride Stopped (04/02/19 1023)  . sodium chloride 10 mL/hr at 03/29/19 2128  . amiodarone 60 mg/hr (04/02/19 1346)  . cefTAZidime (FORTAZ)  IV Stopped (04/02/19 0955)  . dexmedetomidine (PRECEDEX) IV infusion    . epinephrine Stopped (03/31/19 1321)  . feeding supplement (VITAL 1.5 CAL) 1,000 mL (04/02/19 0813)  . heparin 10,000 units/  20 mL infusion syringe 900 Units/hr (04/02/19 0729)  . impella catheter heparin 25 unit/mL in dextrose 5%    . norepinephrine (LEVOPHED) Adult infusion 26 mcg/min (04/02/19 1300)  . prismasol BGK 4/2.5 1,500 mL/hr at 04/02/19 1213  . vasopressin (PITRESSIN) infusion - *FOR SHOCK* 0.04 Units/min (04/02/19 1300)   PRN Meds:.sodium chloride, oxyCODONE **AND** acetaminophen, dextrose, docusate, fentaNYL (SUBLIMAZE) injection, heparin, heparin, midazolam, ondansetron (ZOFRAN) IV, sodium chloride flush, sodium chloride flush  Allergies: No Known Allergies  Family History  Problem Relation Age of Onset  . CAD Mother 44  . CAD Father 65  . CAD Sister   . CAD Brother     Social  History:  reports that he quit smoking about 29 years ago. His smoking use included cigarettes. He has a 12.50 pack-year smoking history. He has never used smokeless tobacco. He reports that he does not drink alcohol or use drugs.  ROS: A complete review of systems was performed.  All systems are negative except for pertinent findings as noted.  Physical Exam:  Vital signs in last 24 hours: Temp:  [97.9 F (36.6 C)-98.9 F (37.2 C)] 98.6 F (37 C) (02/15 1300) Pulse Rate:  [71-86] 77 (02/15 1200) Resp:  [14-31] 19 (02/15 1300) BP: (92-116)/(48-67) 100/51 (02/15 1143) SpO2:  [88 %-100 %] 96 % (02/15 1200) Arterial Line BP: (88-133)/(46-59) 108/57 (02/15 1300) FiO2 (%):  [40 %] 40 % (02/15 1143) Weight:  [97.9 kg] 97.9 kg (02/15 0500) Constitutional:  Alert and oriented, No acute distress Cardiovascular: Regular rate and rhythm Respiratory: on vent via trach GI: Abdomen is soft, nontender, nondistended, no abdominal masses GU: uncirc penis with 43ffoley in place with dark bloody output in bag Lymphatic: No lymphadenopathy Neurologic: Grossly intact, no focal deficits Psychiatric: Normal mood and affect  Laboratory Data:  Recent Labs    04/01/19 0400 04/01/19 0455 04/01/19 1232 04/01/19 2004  04/02/19 0349 04/02/19 0450 04/02/19 1242  WBC 22.0*  --  24.9* 24.9* 30.6*  --  35.0*  HGB 8.0*   < > 7.6* 10.2* 10.0* 10.2* 9.9*  HCT 25.4*   < > 24.7* 31.5* 30.4* 30.0* 31.4*  PLT 171  --  178 169 158  --  146*   < > = values in this interval not displayed.    Recent Labs    03/31/19 0408 03/31/19 0408 03/31/19 1510 03/31/19 1510 04/01/19 0400 04/01/19 0455 04/01/19 1844 04/02/19 0349 04/02/19 0450  NA 133*  134*   < > 134*   < > 134* 134* 134* 136 134*  K 4.3  4.4   < > 4.1   < > 4.5 4.4 4.4 4.3 4.7  CL 99  --  100  --  100  --  102 101  --   GLUCOSE 182*  --  194*  --  187*  --  200* 188*  --   BUN 70*  --  58*  --  49*  --  47* 43*  --   CALCIUM 7.8*  --  7.7*  --  7.9*  --  7.6* 7.9*  --   CREATININE 2.32*  --  2.03*  --  1.89*  --  1.79* 1.83*  --    < > = values in this interval not displayed.     Results for orders placed or performed during the hospital encounter of 03/13/19 (from the past 24 hour(s))  Glucose, capillary     Status: Abnormal   Collection Time: 04/01/19  5:41 PM  Result Value Ref Range   Glucose-Capillary 185 (H) 70 - 99 mg/dL  Renal function panel     Status: Abnormal   Collection Time: 04/01/19  6:44 PM  Result Value Ref Range   Sodium 134 (L) 135 - 145 mmol/L   Potassium 4.4 3.5 - 5.1 mmol/L   Chloride 102 98 - 111 mmol/L   CO2 22 22 - 32 mmol/L   Glucose, Bld 200 (H) 70 - 99 mg/dL   BUN 47 (H) 8 - 23 mg/dL   Creatinine, Ser 1.79 (H) 0.61 - 1.24 mg/dL   Calcium 7.6 (L) 8.9 - 10.3 mg/dL   Phosphorus 2.3 (L) 2.5 - 4.6  mg/dL   Albumin 2.0 (L) 3.5 - 5.0 g/dL   GFR calc non Af Amer 33 (L) >60 mL/min   GFR calc Af Amer 38 (L) >60 mL/min   Anion gap 10 5 - 15  Heparin level (unfractionated)     Status: Abnormal   Collection Time: 04/01/19  6:44 PM  Result Value Ref Range   Heparin Unfractionated <0.10 (L) 0.30 - 0.70 IU/mL  POCT Activated clotting time     Status: None   Collection Time: 04/01/19  7:03 PM  Result Value Ref Range    Activated Clotting Time 131 seconds  CBC     Status: Abnormal   Collection Time: 04/01/19  8:04 PM  Result Value Ref Range   WBC 24.9 (H) 4.0 - 10.5 K/uL   RBC 3.52 (L) 4.22 - 5.81 MIL/uL   Hemoglobin 10.2 (L) 13.0 - 17.0 g/dL   HCT 31.5 (L) 39.0 - 52.0 %   MCV 89.5 80.0 - 100.0 fL   MCH 29.0 26.0 - 34.0 pg   MCHC 32.4 30.0 - 36.0 g/dL   RDW 15.3 11.5 - 15.5 %   Platelets 169 150 - 400 K/uL   nRBC 0.2 0.0 - 0.2 %  Glucose, capillary     Status: Abnormal   Collection Time: 04/01/19  8:12 PM  Result Value Ref Range   Glucose-Capillary 170 (H) 70 - 99 mg/dL  POCT Activated clotting time     Status: None   Collection Time: 04/01/19  8:28 PM  Result Value Ref Range   Activated Clotting Time 131 seconds  POCT Activated clotting time     Status: None   Collection Time: 04/01/19 10:07 PM  Result Value Ref Range   Activated Clotting Time 147 seconds  POCT Activated clotting time     Status: None   Collection Time: 04/01/19 11:12 PM  Result Value Ref Range   Activated Clotting Time 153 seconds  Glucose, capillary     Status: Abnormal   Collection Time: 04/02/19 12:11 AM  Result Value Ref Range   Glucose-Capillary 173 (H) 70 - 99 mg/dL  POCT Activated clotting time     Status: None   Collection Time: 04/02/19 12:23 AM  Result Value Ref Range   Activated Clotting Time 169 seconds  POCT Activated clotting time     Status: None   Collection Time: 04/02/19  2:07 AM  Result Value Ref Range   Activated Clotting Time 175 seconds  POCT Activated clotting time     Status: None   Collection Time: 04/02/19  3:12 AM  Result Value Ref Range   Activated Clotting Time 180 seconds  Cooxemetry Panel (carboxy, met, total hgb, O2 sat)     Status: Abnormal   Collection Time: 04/02/19  3:49 AM  Result Value Ref Range   Total hemoglobin 11.3 (L) 12.0 - 16.0 g/dL   O2 Saturation 71.0 %   Carboxyhemoglobin 1.4 0.5 - 1.5 %   Methemoglobin 0.8 0.0 - 1.5 %  LDH     Status: Abnormal   Collection Time:  04/02/19  3:49 AM  Result Value Ref Range   LDH 247 (H) 98 - 192 U/L  CBC     Status: Abnormal   Collection Time: 04/02/19  3:49 AM  Result Value Ref Range   WBC 30.6 (H) 4.0 - 10.5 K/uL   RBC 3.36 (L) 4.22 - 5.81 MIL/uL   Hemoglobin 10.0 (L) 13.0 - 17.0 g/dL   HCT 30.4 (L) 39.0 - 52.0 %  MCV 90.5 80.0 - 100.0 fL   MCH 29.8 26.0 - 34.0 pg   MCHC 32.9 30.0 - 36.0 g/dL   RDW 15.7 (H) 11.5 - 15.5 %   Platelets 158 150 - 400 K/uL   nRBC 0.3 (H) 0.0 - 0.2 %  Comprehensive metabolic panel     Status: Abnormal   Collection Time: 04/02/19  3:49 AM  Result Value Ref Range   Sodium 136 135 - 145 mmol/L   Potassium 4.3 3.5 - 5.1 mmol/L   Chloride 101 98 - 111 mmol/L   CO2 25 22 - 32 mmol/L   Glucose, Bld 188 (H) 70 - 99 mg/dL   BUN 43 (H) 8 - 23 mg/dL   Creatinine, Ser 1.83 (H) 0.61 - 1.24 mg/dL   Calcium 7.9 (L) 8.9 - 10.3 mg/dL   Total Protein 5.5 (L) 6.5 - 8.1 g/dL   Albumin 2.0 (L) 3.5 - 5.0 g/dL   AST 35 15 - 41 U/L   ALT 20 0 - 44 U/L   Alkaline Phosphatase 74 38 - 126 U/L   Total Bilirubin 0.9 0.3 - 1.2 mg/dL   GFR calc non Af Amer 32 (L) >60 mL/min   GFR calc Af Amer 37 (L) >60 mL/min   Anion gap 10 5 - 15  Heparin level (unfractionated)     Status: None   Collection Time: 04/02/19  3:49 AM  Result Value Ref Range   Heparin Unfractionated 0.35 0.30 - 0.70 IU/mL  Magnesium     Status: Abnormal   Collection Time: 04/02/19  3:49 AM  Result Value Ref Range   Magnesium 2.5 (H) 1.7 - 2.4 mg/dL  APTT     Status: Abnormal   Collection Time: 04/02/19  3:49 AM  Result Value Ref Range   aPTT 156 (H) 24 - 36 seconds  Glucose, capillary     Status: Abnormal   Collection Time: 04/02/19  3:54 AM  Result Value Ref Range   Glucose-Capillary 170 (H) 70 - 99 mg/dL  POCT Activated clotting time     Status: None   Collection Time: 04/02/19  4:15 AM  Result Value Ref Range   Activated Clotting Time 180 seconds  I-STAT 7, (LYTES, BLD GAS, ICA, H+H)     Status: Abnormal   Collection  Time: 04/02/19  4:50 AM  Result Value Ref Range   pH, Arterial 7.426 7.350 - 7.450   pCO2 arterial 40.3 32.0 - 48.0 mmHg   pO2, Arterial 66.0 (L) 83.0 - 108.0 mmHg   Bicarbonate 26.6 20.0 - 28.0 mmol/L   TCO2 28 22 - 32 mmol/L   O2 Saturation 93.0 %   Acid-Base Excess 2.0 0.0 - 2.0 mmol/L   Sodium 134 (L) 135 - 145 mmol/L   Potassium 4.7 3.5 - 5.1 mmol/L   Calcium, Ion 1.11 (L) 1.15 - 1.40 mmol/L   HCT 30.0 (L) 39.0 - 52.0 %   Hemoglobin 10.2 (L) 13.0 - 17.0 g/dL   Patient temperature 98.2 F    Sample type ARTERIAL   POCT Activated clotting time     Status: None   Collection Time: 04/02/19  5:24 AM  Result Value Ref Range   Activated Clotting Time 186 seconds  POCT Activated clotting time     Status: None   Collection Time: 04/02/19  6:32 AM  Result Value Ref Range   Activated Clotting Time 186 seconds  Glucose, capillary     Status: Abnormal   Collection Time: 04/02/19  7:25 AM  Result Value Ref Range   Glucose-Capillary 192 (H) 70 - 99 mg/dL  POCT Activated clotting time     Status: None   Collection Time: 04/02/19  7:58 AM  Result Value Ref Range   Activated Clotting Time 197 seconds  Glucose, capillary     Status: Abnormal   Collection Time: 04/02/19 11:13 AM  Result Value Ref Range   Glucose-Capillary 173 (H) 70 - 99 mg/dL   Comment 1 Glucose Stabilizer    Comment 2 Procedure Error   CBC     Status: Abnormal   Collection Time: 04/02/19 12:42 PM  Result Value Ref Range   WBC 35.0 (H) 4.0 - 10.5 K/uL   RBC 3.37 (L) 4.22 - 5.81 MIL/uL   Hemoglobin 9.9 (L) 13.0 - 17.0 g/dL   HCT 31.4 (L) 39.0 - 52.0 %   MCV 93.2 80.0 - 100.0 fL   MCH 29.4 26.0 - 34.0 pg   MCHC 31.5 30.0 - 36.0 g/dL   RDW 16.1 (H) 11.5 - 15.5 %   Platelets 146 (L) 150 - 400 K/uL   nRBC 0.5 (H) 0.0 - 0.2 %   Recent Results (from the past 240 hour(s))  Culture, respiratory (non-expectorated)     Status: None   Collection Time: 03/26/19  6:29 AM   Specimen: Tracheal Aspirate; Respiratory  Result  Value Ref Range Status   Specimen Description TRACHEAL ASPIRATE  Final   Special Requests NONE  Final   Gram Stain   Final    ABUNDANT WBC PRESENT,BOTH PMN AND MONONUCLEAR NO ORGANISMS SEEN Performed at [facility], [address], [place], [place] [address]    Culture FEW PSEUDOMONAS AERUGINOSA  Final   Report Status 03/28/2019 FINAL  Final   Organism ID, Bacteria PSEUDOMONAS AERUGINOSA  Final      Susceptibility   Pseudomonas aeruginosa - MIC*    CEFTAZIDIME 8 SENSITIVE Sensitive     CIPROFLOXACIN <=0.25 SENSITIVE Sensitive     GENTAMICIN 4 SENSITIVE Sensitive     IMIPENEM 2 SENSITIVE Sensitive     * FEW PSEUDOMONAS AERUGINOSA    Renal Function: Recent Labs    03/30/19 0255 03/30/19 1510 03/31/19 0408 03/31/19 1510 04/01/19 0400 04/01/19 1844 04/02/19 0349  CREATININE 3.47* 2.73* 2.32* 2.03* 1.89* 1.79* 1.83*   Estimated Creatinine Clearance: 31.7 mL/min (A) (by C-G formula based on SCr of 1.83 mg/dL (H)).  Radiologic Imaging: DG CHEST PORT 1 VIEW  Result Date: 04/02/2019 CLINICAL DATA:  History of CABG. EXAM: PORTABLE CHEST 1 VIEW COMPARISON:  Chest radiograph 04/01/2019 FINDINGS: Tracheostomy tube terminates at the level of the clavicular heads. Unchanged position of a right IJ sheath, left subclavian catheter, right PICC and right subclavian approach Impella device. An enteric tube passes below the level of left hemidiaphragm with tip excluded from the field of view. Sequela of prior CABG and left atrial appendage clipping. The cardiomediastinal silhouette is unchanged. Unchanged layering bilateral pleural effusions. Redemonstrated atelectasis and/or consolidation within the left lung base. No evidence of pneumothorax. No acute bony abnormality. IMPRESSION: Support apparatus as described. Layering bilateral pleural effusions with left basilar atelectasis and/or pneumonia, similar to prior examination. Electronically Signed   By: Kellie Simmering DO   On: 04/02/2019 07:59    DG Chest Port 1 View  Result Date: 04/01/2019 CLINICAL DATA:  Left ventricular assist device in place. EXAM: PORTABLE CHEST 1 VIEW COMPARISON:  03/31/2019 FINDINGS: Tracheostomy tube overlies the airway. A right jugular sheath, left subclavian catheter, right PICC, and right  subclavian approach Impella device remain in place. Enteric tubes course into the abdomen with tips not imaged. Sequelae of CABG and left atrial appendage clipping are again identified. The cardiomediastinal silhouette is unchanged. Veiling opacities in the lung bases, left greater than right, suggest small pleural effusions and appear improved from the prior study although this may also reflect differences in patient positioning. There is persistent asymmetric atelectasis or consolidation in the left lung base. No pneumothorax is identified. IMPRESSION: 1. Support devices as above. 2. Pleural effusions and bibasilar atelectasis with overall improved lung aeration since yesterday. Persistent asymmetric left basilar atelectasis or consolidation. Electronically Signed   By: Logan Bores M.D.   On: 04/01/2019 09:22   Procedure: Balloon was deflated and 48ffoley easily removed. Area was prepped and draped in sterile fashion.  Unsuccessful attempt to place 20 coude 3 way foley due to possible urethral stricture.  18 coude placed without difficulty and hand irrigated with sterile water.  Several clots returned followed by light-medium red irrigation fluid. Balloon inflated with 10cc sterile water. Cath connected to foley bag tubing which was noted to be caught between SCD boots and not draining properly.  Tubing placed in leg strap and then arranged down bedside to gravity.  Approx 150cc red fluid drained into bag.   Impression/Recommendation  Gross hematuria--per pt report has been present for quite some time and is likely due to friable tissue after radiation in pt on Xarelto at home and heparin post op.  Pt will not have significant  urinary output while on HD. Continue hand irrigation to remove clots and keep foley draining. If pt eventually requires 3 way cath he may need bedside cysto for placement.   ADebbrah Alar2/15/2021, 1:51 PM

## 2019-04-02 NOTE — Progress Notes (Addendum)
f    Advanced Heart Failure Rounding Note  PCP-Cardiologist: Kate Sable, MD   Subjective:    Remains on impella 5.5 support, P-6, Flow 3.8 CPO 0.9    Started CVVHD 2/11. UF remains at -200 but has been limited by low BP. Weight down another 9  pounds.   On vent through trach. Respiratory culture with pseudomonas on Fortaz  In/out AF on Amio 60 mg per hour.   Over the last 24 hours had 5 blood bowel movements. Received 2UPRBCs. Having ongoing blood in foley. Heparin drip stopped. Remains on heparin in purge + CRRT.   Denies pain.    Flo-trak: CO 4.5 CI 2,1 SVR 1038   Impella 5.5: P-6 Flow 3.8  CPO 0.9    Objective:   Weight Range: 101.9 kg Body mass index is 34.16 kg/m.   Vital Signs:   Temp:  [98.1 F (36.7 C)-99.3 F (37.4 C)] 98.2 F (36.8 C) (02/15 0400) Pulse Rate:  [71-86] 72 (02/15 0600) Resp:  [14-31] 17 (02/15 0600) BP: (89-116)/(48-67) 104/52 (02/15 0442) SpO2:  [88 %-100 %] 100 % (02/15 0600) Arterial Line BP: (65-133)/(46-59) 124/59 (02/15 0600) FiO2 (%):  [40 %] 40 % (02/15 0442) Last BM Date: 03/31/19  Weight change: Filed Weights   03/30/19 0400 03/31/19 0500 04/01/19 0500  Weight: 105.7 kg 102.7 kg 101.9 kg    Intake/Output:   Intake/Output Summary (Last 24 hours) at 04/02/2019 0708 Last data filed at 04/02/2019 0600 Gross per 24 hour  Intake 4121.26 ml  Output 7985 ml  Net -3863.74 ml      Physical Exam  CVP 8-9  General:  Intubated.  HEENT: normal Neck: Trach, supple. Difficult to assess JVP due to lines.   Carotids 2+ bilat; no bruits. No lymphadenopathy or thryomegaly appreciated. Cor: PMI nondisplaced. Regular rate & rhythm. No rubs, gallops or murmurs. Lungs: clear Abdomen: soft, nontender, nondistended. No hepatosplenomegaly. No bruits or masses. Good bowel sounds. Extremities: no cyanosis, clubbing, rash, R and LLE 3+ with unna boots. RIJ  RUE PICC  Neuro: Awake. Follows commands. GU: foley bloody  Telemetry   NSR  with PVCs 60-70s   Labs    CBC Recent Labs    04/01/19 2004 04/01/19 2004 04/02/19 0349 04/02/19 0450  WBC 24.9*  --  30.6*  --   HGB 10.2*   < > 10.0* 10.2*  HCT 31.5*   < > 30.4* 30.0*  MCV 89.5  --  90.5  --   PLT 169  --  158  --    < > = values in this interval not displayed.   Basic Metabolic Panel Recent Labs    03/31/19 1510 03/31/19 1510 04/01/19 0400 04/01/19 0455 04/01/19 1844 04/01/19 1844 04/02/19 0349 04/02/19 0450  NA 134*   < > 134*   < > 134*   < > 136 134*  K 4.1   < > 4.5   < > 4.4   < > 4.3 4.7  CL 100   < > 100   < > 102  --  101  --   CO2 22   < > 24   < > 22  --  25  --   GLUCOSE 194*   < > 187*   < > 200*  --  188*  --   BUN 58*   < > 49*   < > 47*  --  43*  --   CREATININE 2.03*   < > 1.89*   < >  1.79*  --  1.83*  --   CALCIUM 7.7*   < > 7.9*   < > 7.6*  --  7.9*  --   MG  --   --  2.5*  --   --   --  2.5*  --   PHOS 3.3  --   --   --  2.3*  --   --   --    < > = values in this interval not displayed.   Liver Function Tests Recent Labs    04/01/19 0400 04/01/19 0400 04/01/19 1844 04/02/19 0349  AST 35  --   --  35  ALT 20  --   --  20  ALKPHOS 66  --   --  74  BILITOT 0.6  --   --  0.9  PROT 5.5*  --   --  5.5*  ALBUMIN 2.0*   < > 2.0* 2.0*   < > = values in this interval not displayed.   No results for input(s): LIPASE, AMYLASE in the last 72 hours. Cardiac Enzymes No results for input(s): CKTOTAL, CKMB, CKMBINDEX, TROPONINI in the last 72 hours.  BNP: BNP (last 3 results) Recent Labs    03/22/19 0401 03/26/19 0626 03/27/19 1255  BNP 340.4* 687.5* 800.4*    ProBNP (last 3 results) No results for input(s): PROBNP in the last 8760 hours.   D-Dimer No results for input(s): DDIMER in the last 72 hours. Hemoglobin A1C No results for input(s): HGBA1C in the last 72 hours. Fasting Lipid Panel No results for input(s): CHOL, HDL, LDLCALC, TRIG, CHOLHDL, LDLDIRECT in the last 72 hours. Thyroid Function Tests No results  for input(s): TSH, T4TOTAL, T3FREE, THYROIDAB in the last 72 hours.  Invalid input(s): FREET3  Other results:   Imaging    No results found.   Medications:     Scheduled Medications: . aspirin  81 mg Per Tube Daily  . atorvastatin  80 mg Per Tube q1800  . B-complex with vitamin C  1 tablet Oral Daily  . bisacodyl  10 mg Oral Daily   Or  . bisacodyl  10 mg Rectal Daily  . chlorhexidine gluconate (MEDLINE KIT)  15 mL Mouth Rinse BID  . Chlorhexidine Gluconate Cloth  6 each Topical Daily  . docusate  200 mg Per Tube Daily  . feeding supplement (PRO-STAT SUGAR FREE 64)  60 mL Per Tube TID  . insulin aspart  0-24 Units Subcutaneous Q4H  . insulin glargine  22 Units Subcutaneous Daily  . mouth rinse  15 mL Mouth Rinse 10 times per day  . pantoprazole (PROTONIX) IV  40 mg Intravenous Q24H  . sodium chloride flush  10-40 mL Intracatheter Q12H  . tamsulosin  0.4 mg Oral Daily    Infusions: .  prismasol BGK 4/2.5 400 mL/hr at 04/02/19 0532  .  prismasol BGK 4/2.5 200 mL/hr at 04/01/19 1642  . sodium chloride    . sodium chloride Stopped (04/01/19 2315)  . sodium chloride 10 mL/hr at 03/29/19 2128  . amiodarone 60 mg/hr (04/02/19 0600)  . cefTAZidime (FORTAZ)  IV Stopped (04/01/19 2240)  . dexmedetomidine (PRECEDEX) IV infusion    . epinephrine Stopped (03/31/19 1321)  . feeding supplement (VITAL 1.5 CAL) 1,000 mL (04/01/19 1005)  . heparin 10,000 units/ 20 mL infusion syringe 1,150 Units/hr (04/02/19 0703)  . impella catheter heparin 25 unit/mL in dextrose 5%    . norepinephrine (LEVOPHED) Adult infusion 12 mcg/min (04/02/19 0640)  . prismasol  BGK 4/2.5 1,500 mL/hr at 04/02/19 0532  . vasopressin (PITRESSIN) infusion - *FOR SHOCK* 0.04 Units/min (04/02/19 0600)    PRN Medications: sodium chloride, oxyCODONE **AND** acetaminophen, dextrose, docusate, fentaNYL (SUBLIMAZE) injection, heparin, heparin, midazolam, ondansetron (ZOFRAN) IV, sodium chloride flush, sodium chloride  flush    Assessment/Plan    1. Acute systolic HF -> Cardiogenic shock - post-op echo on 03/20/19 EF 25-30% (pre-op 30-35%. In 12/2017 EF normal) - Remains on Impella support P-6 Waveforms ok LDH  - CO-OX 71% . On VP 0.04 and NE 12 - Likely vasodilated with CVVHD and Impella. - Continue CVVHD. Support BP to facilitate volume removal at at least -200/hr as he remains ~ 30 pounds up  - He remains critically ill. Hemodynamically improved with full support with impella and dual pressors. Remains markedly volume overloaded.Continue CVVHD. Once fluid off can work on Public librarian and pressors  2. CAD s/p CABG this admit (LIMA->LAD, SVG->OM, SVG->RCA on 03/15/19) - No current s/s angina - Continue ASA/statin  3. Acute hypoxic respiratory failure with Pseudomonas PNA - s/p trach on 03/27/19 - Continue Fortaz - CCM managing vent (thanks!)  4. PAF - s/p MAZE and LAA occlusion - In/out of AF. In SR   continue IV amio - Off heparin with melena + hematuria  5. AKI  - due to shock/ATN -Creatinine peaked at 5.04 - Started [facility] 03/29/19 per Nephrology.   6. Hematuria -LDH ok. Off heparin drip.  - Bladder irrigation every 4 hours.   7. Melena 5 episodes   8. Anemia -Hematuria/melena -Hgb 7.6 -->10.2  -Received 2UPRBCs    Length of Stay: [place], NP  04/02/2019, 7:08 AM  Advanced Heart Failure Team Pager [phone] (M-F; [place])  Please contact [facility] for night-coverage after hours (4p -7a ) and weekends on [email or web address]  Agree with above  Remains critically ill with MSOF. On CVVHD and Impella 5.5. Multiple bloody BMS overnight. Heparin stopped (except in Impella purge) and transfused 2 units. Also having blood in Foley.   Now on VP and NE 12 for BP support. Weight down another 9 pounds with CVVHD. Still 15-20 pounds above pre-op  Denies SOB. + GU pain  General:  Ill appearing. No resp difficulty HEENT: normal Neck: supple. + trach RIJ trialysis. Carotids 2+  bilat; no bruits. No lymphadenopathy or thryomegaly appreciated. Cor: Impella site with mild ooze. Sternal wound ok PMI nondisplaced. Regular rate & rhythm. No rubs, gallops or murmurs. Lungs: clear Abdomen: soft, nontender, nondistended. No hepatosplenomegaly. No bruits or masses. Good bowel sounds. Extremities: no cyanosis, clubbing, rash, 2+ edema + bloody in Foley Neuro: alert and interactive  Remains very tenuous with MSOF and acute GI bleed. Heparin now off. Primary issue now is to continue fluid removal with CVVHD then can try to wean mechanical support. Stable on trach for now. I am not sure his renal function will recover.   Discussed options with him and informed him that given advanced age and number of organ systems involved, the likelihood of meaningful recovery is low but not impossible. Will continue aggressive care for now. May be worthwhile to involve Palliative for [place] discussions. Impella parameters reviewed personally - waveforms and positioning are stable.   D/w Drs Prescott Gum and [facility] personally.    CRITICAL CARE Performed by: Glori Bickers  Total critical care time: 35 minutes  Critical care time was exclusive of separately billable procedures and treating other patients.  Critical care was necessary to treat or prevent  imminent or life-threatening deterioration.  Critical care was time spent personally by me (independent of midlevel providers or residents) on the following activities: development of treatment plan with patient and/or surrogate as well as nursing, discussions with consultants, evaluation of patient's response to treatment, examination of patient, obtaining history from patient or surrogate, ordering and performing treatments and interventions, ordering and review of laboratory studies, ordering and review of radiographic studies, pulse oximetry and re-evaluation of patient's condition.  Glori Bickers, MD  12:37 PM

## 2019-04-02 NOTE — Progress Notes (Addendum)
TCTS DAILY ICU PROGRESS NOTE                   [address]            [place] [address]          [phone]   6 Days Post-Op Procedure(s) (LRB): TRACHEOSTOMY placed using Shiley 8DCT Cuffed. (N/A) Placement Of Impella Left Ventricular Assist Device using ABIOMED Impella 5.5 with SmartAssist Device. (Right)  Total Length of Stay:  LOS: 19 days   Subjective: Hemodynamics pretty stable on impella, P-6, levo and vasopressin Sinus rhythm Objective: Vital signs in last 24 hours: Temp:  [97.9 F (36.6 C)-99.3 F (37.4 C)] 97.9 F (36.6 C) (02/15 0727) Pulse Rate:  [71-86] 75 (02/15 0700) Cardiac Rhythm: Normal sinus rhythm (02/15 0400) Resp:  [14-31] 29 (02/15 0700) BP: (89-116)/(48-67) 104/52 (02/15 0442) SpO2:  [88 %-100 %] 100 % (02/15 0700) Arterial Line BP: (65-133)/(46-59) 112/55 (02/15 0700) FiO2 (%):  [40 %] 40 % (02/15 0442) Weight:  [97.9 kg] 97.9 kg (02/15 0500)  Filed Weights   03/31/19 0500 04/01/19 0500 04/02/19 0500  Weight: 102.7 kg 101.9 kg 97.9 kg    Weight change: -4 kg   Hemodynamic parameters for last 24 hours: CVP:  [3 mmHg-13 mmHg] 10 mmHg  Intake/Output from previous day: 02/14 0701 - 02/15 0700 In: 4235.6 [I.V.:1589.9; Blood:630; NG/GT:1177.7; IV Piggyback:300.1] Out: 8127 [Urine:180; Emesis/NG output:50]  Intake/Output this shift: Total I/O In: 113.3 [I.V.:59.6; Other:13.7; NG/GT:40] Out: 785 [Other:785]  Current Meds: Scheduled Meds: . aspirin  81 mg Per Tube Daily  . atorvastatin  80 mg Per Tube q1800  . B-complex with vitamin C  1 tablet Oral Daily  . bisacodyl  10 mg Oral Daily   Or  . bisacodyl  10 mg Rectal Daily  . chlorhexidine gluconate (MEDLINE KIT)  15 mL Mouth Rinse BID  . Chlorhexidine Gluconate Cloth  6 each Topical Daily  . docusate  200 mg Per Tube Daily  . feeding supplement (PRO-STAT SUGAR FREE 64)  60 mL Per Tube TID  . insulin aspart  0-24 Units Subcutaneous Q4H  . insulin glargine  22 Units  Subcutaneous Daily  . mouth rinse  15 mL Mouth Rinse 10 times per day  . pantoprazole (PROTONIX) IV  40 mg Intravenous Q24H  . sodium chloride flush  10-40 mL Intracatheter Q12H  . tamsulosin  0.4 mg Oral Daily   Continuous Infusions: .  prismasol BGK 4/2.5 400 mL/hr at 04/02/19 0532  .  prismasol BGK 4/2.5 200 mL/hr at 04/01/19 1642  . sodium chloride    . sodium chloride Stopped (04/01/19 2315)  . sodium chloride 10 mL/hr at 03/29/19 2128  . amiodarone 60 mg/hr (04/02/19 0800)  . cefTAZidime (FORTAZ)  IV Stopped (04/01/19 2240)  . dexmedetomidine (PRECEDEX) IV infusion    . epinephrine Stopped (03/31/19 1321)  . feeding supplement (VITAL 1.5 CAL) 1,000 mL (04/02/19 0813)  . heparin 10,000 units/ 20 mL infusion syringe 900 Units/hr (04/02/19 0729)  . impella catheter heparin 25 unit/mL in dextrose 5%    . norepinephrine (LEVOPHED) Adult infusion 12 mcg/min (04/02/19 0800)  . prismasol BGK 4/2.5 1,500 mL/hr at 04/02/19 0532  . vasopressin (PITRESSIN) infusion - *FOR SHOCK* 0.04 Units/min (04/02/19 0800)   PRN Meds:.sodium chloride, oxyCODONE **AND** acetaminophen, dextrose, docusate, fentaNYL (SUBLIMAZE) injection, heparin, heparin, midazolam, ondansetron (ZOFRAN) IV, sodium chloride flush, sodium chloride flush  General appearance: alert, cooperative and no distress Heart: regularly irregular rhythm Lungs:  clear anteriorly Abdomen: soft, non-tender Extremities: edema slowly improving Wound: incis healing well  Lab Results: CBC: Recent Labs    04/01/19 2004 04/01/19 2004 04/02/19 0349 04/02/19 0450  WBC 24.9*  --  30.6*  --   HGB 10.2*   < > 10.0* 10.2*  HCT 31.5*   < > 30.4* 30.0*  PLT 169  --  158  --    < > = values in this interval not displayed.   BMET:  Recent Labs    04/01/19 1844 04/01/19 1844 04/02/19 0349 04/02/19 0450  NA 134*   < > 136 134*  K 4.4   < > 4.3 4.7  CL 102  --  101  --   CO2 22  --  25  --   GLUCOSE 200*  --  188*  --   BUN 47*  --   43*  --   CREATININE 1.79*  --  1.83*  --   CALCIUM 7.6*  --  7.9*  --    < > = values in this interval not displayed.    CMET: Lab Results  Component Value Date   WBC 30.6 (H) 04/02/2019   HGB 10.2 (L) 04/02/2019   HCT 30.0 (L) 04/02/2019   PLT 158 04/02/2019   GLUCOSE 188 (H) 04/02/2019   CHOL 117 03/16/2019   TRIG 52 03/16/2019   HDL 33 (L) 03/16/2019   LDLCALC 74 03/16/2019   ALT 20 04/02/2019   AST 35 04/02/2019   NA 134 (L) 04/02/2019   K 4.7 04/02/2019   CL 101 04/02/2019   CREATININE 1.83 (H) 04/02/2019   BUN 43 (H) 04/02/2019   CO2 25 04/02/2019   TSH 3.683 03/13/2019   INR 1.7 (H) 03/27/2019   HGBA1C 6.1 (H) 03/13/2019      PT/INR: No results for input(s): LABPROT, INR in the last 72 hours. Radiology: DG CHEST PORT 1 VIEW  Result Date: 04/02/2019 CLINICAL DATA:  History of CABG. EXAM: PORTABLE CHEST 1 VIEW COMPARISON:  Chest radiograph 04/01/2019 FINDINGS: Tracheostomy tube terminates at the level of the clavicular heads. Unchanged position of a right IJ sheath, left subclavian catheter, right PICC and right subclavian approach Impella device. An enteric tube passes below the level of left hemidiaphragm with tip excluded from the field of view. Sequela of prior CABG and left atrial appendage clipping. The cardiomediastinal silhouette is unchanged. Unchanged layering bilateral pleural effusions. Redemonstrated atelectasis and/or consolidation within the left lung base. No evidence of pneumothorax. No acute bony abnormality. IMPRESSION: Support apparatus as described. Layering bilateral pleural effusions with left basilar atelectasis and/or pneumonia, similar to prior examination. Electronically Signed   By: Kellie Simmering DO   On: 04/02/2019 07:59     Assessment/Plan: S/P Procedure(s) (LRB): TRACHEOSTOMY placed using Shiley 8DCT Cuffed. (N/A) Placement Of Impella Left Ventricular Assist Device using ABIOMED Impella 5.5 with SmartAssist Device. (Right)  1 stable on  current management except requiring blood transfusions, most recent HCT 30 , + hematuria, plan to get urology to assist with bladder irrigation . Also has melena 2 AHF team conts to assist with management of hemodynamic issues. CoOx71 3 nephrology managing CRRT- hoping to get more volume off today. BUN/Creat cont to be fairly stable and significantly improved from highs 4 PCCM managing trach and vent- some hypoxemia on ABG but acid-base is stable. Fortaz for pseudomonas PNA 5 Conts TF's BS fair control, cont insulin dosing and SSI 6 in sinus rhythm on [employer] E  Gold 04/02/2019 8:23 AM   Hemodynamics improved after 2 U blood GI and bladder bleeding- heparin reduced with goal 0.3 heparin activity L effusion has not resolved with CVVH so will place small chest tube at bedside Patient awake with purposeful response  patient examined and medical record reviewed,agree with above note. Tharon Aquas Trigt III 04/02/2019

## 2019-04-02 NOTE — Progress Notes (Signed)
ANTICOAGULATION CONSULT NOTE   Pharmacy Consult for anticoagulation management/assistance  Indication: Impella 5.5  No Known Allergies  Patient Measurements: Height: 5\' 8"  (172.7 cm) Weight: 215 lb 13.3 oz (97.9 kg) IBW/kg (Calculated) : 68.4 Heparin Dosing Weight: 86kg  Vital Signs: Temp: 98.2 F (36.8 C) (02/15 0400) Temp Source: Oral (02/15 0400) BP: 104/52 (02/15 0442) Pulse Rate: 75 (02/15 0700)  Labs: Recent Labs    04/01/19 0400 04/01/19 0455 04/01/19 1232 04/01/19 1232 04/01/19 1844 04/01/19 2004 04/01/19 2004 04/02/19 0349 04/02/19 0450  HGB 8.0*   < > 7.6*   < >  --  10.2*   < > 10.0* 10.2*  HCT 25.4*   < > 24.7*   < >  --  31.5*  --  30.4* 30.0*  PLT 171   < > 178  --   --  169  --  158  --   APTT  --   --   --   --   --   --   --  156*  --   HEPARINUNFRC 0.30  --   --   --  <0.10*  --   --  0.35  --   CREATININE 1.89*  --   --   --  1.79*  --   --  1.83*  --    < > = values in this interval not displayed.    Estimated Creatinine Clearance: 31.7 mL/min (A) (by C-G formula based on SCr of 1.83 mg/dL (H)).  Assessment: 86 year old male s/p CABG 1/29 with MAZE and LAA. Patient taken to OR 2/9 for tracheostomy and impella 5.5 placement.   Purge is heparin half/strength (25 units/ml) infusing ~13.5 ml/h (~340 units/h). Systemic heparin infusion stopped last night- receiving 1150 units/hr in CRRT circuit. Heparin level came back therapeutic at 0.35. Hgb stable at 10.2, s/p 2 PRBCs since yesterday. Plt 186. LDH 247. Continues to have melena stool.   Goal of Therapy:  Heparin level ~0.3 units/mL per Dr. VT Monitor platelets by anticoagulation protocol: Yes   Plan:   Continue 1/2 strength heparin purge per MD Will touch base with MD about plan for systemic and CRRT heparin  Antonietta Jewel, PharmD, [place] Pharmacist  Phone: [phone]  Please check AMION for all [place] phone numbers After 10:00 PM, call [place] [phone] 04/02/2019 7:16  AM    ADDENDUM Heparin came back at 0.42 - only on purge solution and heparin in CRRT circuit (was reduced to 900 units/hr this morning). Continued to have melena stool and bloody urine. Purge rate remains the same (~340 units/hr). Plan to stop heparin through CRRT circuit, change to systemic heparin at 500 units/hr and get level in 6 hours to see what rate is.   Antonietta Jewel, PharmD, [place] Clinical Pharmacist

## 2019-04-02 NOTE — Progress Notes (Signed)
PT Cancellation Note  Patient Details Name: Jesse Murphy MRN: [id number] DOB: 1928/06/16   Cancelled Treatment:    Reason Eval/Treat Not Completed: Fatigue/lethargy limiting ability to participate; Patient fatigued and requested PT to return tomorrow.  Better with mobilizing/exercising in AM.  Also RN reports pt in a-fib earlier and had to work to get controlled.  Will attempt another day.    [name] 04/02/2019, 4:32 PM  Magda Kiel, [place] [phone] 04/02/2019

## 2019-04-02 NOTE — Plan of Care (Addendum)
Tmax 99.1. O x 4, able to mouth words and write to communicate, able to follow commands. Patient back in Afib RVR tares 110-130s. Amiodarone bolus given this afternoon. Patient not perfusing ectopic beats, and thus has required increase vasopressor support. NE at 38, and vaso at 0.05. CRRT clotted off this afternoon and filter replaced. Heparin through the circuit stopped and systemic Heparin resumed. Urology consulted and came to bedside. Coude catheter exchanged for bigger size to ease irrigation of foley for clot removal. Patient had 2 bloody BMs this shift. Tube feed coverage insulin added to aid in better glucose control.  Son visited patient this afternoon. Patient wrote on paper "pull plug." Dr Haroldine Laws had conversation with patient this morning. Dr Prescott Gum also had conversation with patient about waiting until the end of the week before making decisions regarding stopping therapies and transitioning to comfort care. Son upset but supportive of patients wishes.     Problem: Education: Goal: Knowledge of disease or condition will improve Outcome: Progressing   Problem: Role Relationship: Goal: Ability to communicate will improve Outcome: Progressing   Problem: Cardiovascular: Goal: Ability to achieve and maintain adequate cardiovascular perfusion will improve Outcome: Not Progressing   Problem: Fluid Volume: Goal: Fluid volume balance will be maintained or improved Outcome: Not Progressing

## 2019-04-02 NOTE — Progress Notes (Addendum)
NAME:  Jesse Murphy, MRN:  [id number], DOB:  September 15, 1928, LOS: 14 ADMISSION DATE:  03/13/2019, CONSULTATION DATE:  2/2 REFERRING MD:  Nils Pyle, CHIEF COMPLAINT:  Dyspnea   Brief History   86 y/o male admitted 1/26, underwent CABG and L atrial appendage clipping on 1/29, moved to ICU on 2/1 for intubation.  Past Medical History  A fib, CAD, Prostate cancer, HTN  Significant Hospital Events   1/29 CABG 2/01 to ICU 2/05 Extubated 2/08 Reintubated 2/09 tracheostomy 2/09 Impella, PA catheter placed 2/11 Start CRRT  Consults:  Nephrology CHF team  Procedures:  Rt PICC 2/02 >> Trach 2/09 >> PA catheter 2/09 >> Lt  HD cath 2/11 >> L Rad Aline 2/3 >>  Impella 2/10 >>  Significant Diagnostic Tests:  Echo 1/26 >> EF 30 to 35%  Micro Data:  SARS CoV2 PCR 1/26 >> negative Influenza PCR 1/26 >> negative Sputum 2/01 >> oral flora Sputum 2/08 >> Pseudomonas >> pan sensitive   Antimicrobials:  Rocephin 2/01 >> 2/07 Cefepime 2/08 >> 2/10 Fortaz 2/10 >>   Interim history/subjective:  Afebrile / WBC 30.6 Glucose range 146-200 I/O 175ml UOP / 7.7 removed with CVVHD, net neg 3.7L in 24h RN reports bloody BM x5 overnight, blood in foley, received 2 units PRBC overnight  No acute events.  Pt weaning on PSV 10/5 CVVHD ongoing with net neg 235ml/hr   Objective   Blood pressure (!) 103/53, pulse 78, temperature 97.9 F (36.6 C), temperature source Oral, resp. rate 20, height 5\' 8"  (1.727 m), weight 97.9 kg, SpO2 100 %. CVP:  [3 mmHg-13 mmHg] 9 mmHg  Vent Mode: PSV;CPAP FiO2 (%):  [40 %] 40 % Set Rate:  [18 bmp] 18 bmp Vt Set:  [540 mL] 540 mL PEEP:  [5 cmH20] 5 cmH20 Pressure Support:  [10 cmH20] 10 cmH20 Plateau Pressure:  [13 cmH20-21 cmH20] 21 cmH20   Intake/Output Summary (Last 24 hours) at 04/02/2019 0902 Last data filed at 04/02/2019 0800 Gross per 24 hour  Intake 3996.07 ml  Output 8118 ml  Net -4121.93 ml   Filed Weights   03/31/19 0500 04/01/19 0500 04/02/19  0500  Weight: 102.7 kg 101.9 kg 97.9 kg    Examination: General: elderly male lying in bed on vent in NAD HEENT: MM pink/moist, right IJ introducer, trach midline c/d/i Neuro: Awakens, alert, interactive, follows commands, generalized weakness  CV: s1s2 regular, impella hum, impella right chest c/d/i, no m/r/g PULM: non-labored on vent, lungs bilaterally with soft wheeze GI: soft, bsx4 active  Extremities: warm/dry, generalized 1+ edema  Skin: RN reports skin tear on buttock but no pressure injury, BLE's wrapped   Resolved Hospital Problem list     Assessment & Plan:   Acute Hypoxemic Respiratory Failure due to Acute pulmonary edema complicated by Pseudomonas HCAP Smoker, wheezing, likely COPD? -continue ceftaz, D6/7 -follow intermittent CXR -PRVC as rest mode  -daily SBT as tolerated  -duoneb   Cardiogenic Shock with acute systolic heart failure -levophed / Impella per primary  -ICU monitoring   Atrial fibrillation In SR, bleeding on heparin (rectal, foley) -continue amiodarone  -tele monitoring  -heparin gtt on hold with bleeding   AKI secondary to Cardiogenic Shock -appreciate Nephrology  -volume status per Renal / CVTS  -Trend BMP / urinary output -Replace electrolytes as indicated -Avoid nephrotoxic agents, ensure adequate renal perfusion  Anemia of critical illness Melena, Bloody Urine  PRBC x2 2/14 -heparin on hold as above -ASA per CVTS  -transfuse for Hgb <7%  DM2 with hyperglycemia, poorly controlled -SSI  -increase lantus to 24 units QD -add TF coverage Q4   Acute metabolic encephalopathy due to shock, renal failure, hypoxemia -RASS Goal 0  -minimize all sedating medications -promote sleep / wake cycle  -PT efforts as able   Pressure wounds / Reported as Skin Tear 2/15 -WOC following   Leukocytosis  -follow trend  -abx as above  Best practice:  Diet: TF Pain/Anxiety/Delirium protocol (if indicated): PAD protocol VAP protocol (if  indicated): yes DVT prophylaxis: heparin infusion GI prophylaxis: PPI Glucose control: SSI, glargine Mobility: bed rest Code Status: full Family Communication: per Primary service Disposition: ICU  Labs   CBC: Recent Labs  Lab 03/31/19 1510 03/31/19 1510 04/01/19 0400 04/01/19 0400 04/01/19 0455 04/01/19 1232 04/01/19 2004 04/02/19 0349 04/02/19 0450  WBC 18.2*  --  22.0*  --   --  24.9* 24.9* 30.6*  --   HGB 8.7*   < > 8.0*   < > 8.2* 7.6* 10.2* 10.0* 10.2*  HCT 27.7*   < > 25.4*   < > 24.0* 24.7* 31.5* 30.4* 30.0*  MCV 89.9  --  90.4  --   --  91.5 89.5 90.5  --   PLT 187  --  171  --   --  178 169 158  --    < > = values in this interval not displayed.    Basic Metabolic Panel: Recent Labs  Lab 03/28/19 1532 03/28/19 1539 03/29/19 1541 03/29/19 1552 03/30/19 0255 03/30/19 0546 03/30/19 1510 03/30/19 1510 03/31/19 0408 03/31/19 0408 03/31/19 1510 03/31/19 1510 04/01/19 0400 04/01/19 0455 04/01/19 1844 04/02/19 0349 04/02/19 0450  NA 131*   < > 131*   < > 132*   < > 132*   < > 133*  134*   < > 134*   < > 134* 134* 134* 136 134*  K 3.7   < > 3.7   < > 3.6   < > 4.1   < > 4.3  4.4   < > 4.1   < > 4.5 4.4 4.4 4.3 4.7  CL 91*   < > 92*   < > 96*   < > 97*   < > 99  --  100  --  100  --  102 101  --   CO2 20*   < > 20*   < > 22   < > 23   < > 23  --  22  --  24  --  22 25  --   GLUCOSE 180*   < > 175*   < > 163*   < > 151*   < > 182*  --  194*  --  187*  --  200* 188*  --   BUN 177*   < > 172*   < > 130*   < > 97*   < > 70*  --  58*  --  49*  --  47* 43*  --   CREATININE 4.61*   < > 4.43*   < > 3.47*   < > 2.73*   < > 2.32*  --  2.03*  --  1.89*  --  1.79* 1.83*  --   CALCIUM 7.5*   < > 7.5*   < > 7.5*   < > 7.4*   < > 7.8*  --  7.7*  --  7.9*  --  7.6* 7.9*  --   MG  --   --   --   --  2.4  --   --   --  2.5*  --   --   --  2.5*  --   --  2.5*  --   PHOS 8.1*  --  8.5*  --   --   --  4.6  --   --   --  3.3  --   --   --  2.3*  --   --    < > = values in this  interval not displayed.   GFR: Estimated Creatinine Clearance: 31.7 mL/min (A) (by C-G formula based on SCr of 1.83 mg/dL (H)). Recent Labs  Lab 03/26/19 1027 03/27/19 0514 03/27/19 1305 03/27/19 1605 03/28/19 0417 04/01/19 0400 04/01/19 1232 04/01/19 2004 04/02/19 0349  WBC  --    < >  --   --    < > 22.0* 24.9* 24.9* 30.6*  LATICACIDVEN 1.0  --  1.0 0.9  --   --   --   --   --    < > = values in this interval not displayed.    Liver Function Tests: Recent Labs  Lab 03/29/19 0417 03/29/19 1541 03/30/19 0255 03/30/19 1510 03/31/19 0408 03/31/19 1510 04/01/19 0400 04/01/19 1844 04/02/19 0349  AST 52*  --  47*  --  47*  --  35  --  35  ALT 32  --  24  --  24  --  20  --  20  ALKPHOS 57  --  62  --  65  --  66  --  74  BILITOT 0.6  --  0.6  --  0.7  --  0.6  --  0.9  PROT 5.5*  --  5.4*  --  5.7*  --  5.5*  --  5.5*  ALBUMIN 2.1*   < > 2.0*   < > 2.0* 2.0* 2.0* 2.0* 2.0*   < > = values in this interval not displayed.   No results for input(s): LIPASE, AMYLASE in the last 168 hours. No results for input(s): AMMONIA in the last 168 hours.  ABG    Component Value Date/Time   PHART 7.426 04/02/2019 0450   PCO2ART 40.3 04/02/2019 0450   PO2ART 66.0 (L) 04/02/2019 0450   HCO3 26.6 04/02/2019 0450   TCO2 28 04/02/2019 0450   ACIDBASEDEF 2.0 03/31/2019 0408   O2SAT 93.0 04/02/2019 0450     Coagulation Profile: Recent Labs  Lab 03/27/19 1255  INR 1.7*    Cardiac Enzymes: No results for input(s): CKTOTAL, CKMB, CKMBINDEX, TROPONINI in the last 168 hours.  HbA1C: Hgb A1c MFr Bld  Date/Time Value Ref Range Status  03/13/2019 12:51 PM 6.1 (H) 4.8 - 5.6 % Final    Comment:    (NOTE) Pre diabetes:          5.7%-6.4% Diabetes:              >6.4% Glycemic control for   <7.0% adults with diabetes   09/12/2018 09:36 AM 5.9 (H) 4.8 - 5.6 % Final    Comment:    (NOTE) Pre diabetes:          5.7%-6.4% Diabetes:              >6.4% Glycemic control for    <7.0% adults with diabetes     CBG: Recent Labs  Lab 04/01/19 1741 04/01/19 2012 04/02/19 0011 04/02/19 0354 04/02/19 0725  GLUCAP 185* 170* 173* 170* 192*       Critical care  time: 34 minutes     Noe Gens, MSN, NP-C [facility] 04/02/2019, 9:02 AM   Please see [email or web address] for pager details.   PCCM attending:  This is an 86 year old gentleman admitted on 03/13/2019 underwent CABG and left atrial appendage clipping, hospital course has been complicated by prolonged intubation, prolonged acute hypoxemic respiratory failure requiring tracheostomy placement, cardiogenic shock involving Impella assist device as well as acute renal failure requiring CVVHD.  Pulmonary consulted for management of the patient's ventilator needs while in the cardiothoracic surgery ICU.  BP (!) 100/51   Pulse 82   Temp 97.9 F (36.6 C) (Oral)   Resp 17   Ht 5\' 8"  (1.727 m)   Wt 97.9 kg   SpO2 100%   BMI 32.82 kg/m   General: Elderly male, tracheostomy tube on vent on CVVHD, Impella in place Neck: Trach in place Heart: Regular rate irregular, S1-S2 Lungs: Bilateral ventilated breath sounds, diminished in the bilateral bases Extremities: Significant edema bilaterally  Chest x-ray: Bilateral layering effusions, 04/02/2019 The patient's images have been independently reviewed by me.    Labs: Increasing white count 30,000  Assessment: Acute hypoxemic respiratory failure secondary to bilateral pulmonary edema, complicated by pseudomonal hospital-acquired pneumonia, former smoker Cardiogenic shock, acute systolic heart failure, requiring vasopressors and Impella assist device Atrial fibrillation Acute renal failure requiring CVVHD in the setting of shock Anemia of critical illness, hematuria Climbing leukocytosis  Plan: Patient has layering effusions and remains volume overloaded, continuing volume removal with CVVHD will help improve this. With increasing white  count and effusion with Pseudomonas in the sputum may need to consider thoracentesis or pigtail catheter placement. Urology to evaluate patient today Aspirin continued, heparin on hold Blood transfusion as needed for hemoglobin less than 7  This patient is critically ill with multiple organ system failure; which, requires frequent high complexity decision making, assessment, support, evaluation, and titration of therapies. This was completed through the application of advanced monitoring technologies and extensive interpretation of multiple databases. During this encounter critical care time was devoted to patient care services described in this note for 33 minutes.   [facility] 04/02/2019 12:05 PM

## 2019-04-02 NOTE — Progress Notes (Signed)
Patient ID: RAYMONE PEMBROKE, male   DOB: 09-05-1928, 86 y.o.   MRN: [id number] S: Doesn't feel well, complaining of pain at incision O:BP (!) 100/51   Pulse 82   Temp 97.9 F (36.6 C) (Oral)   Resp 17   Ht 5' 8" (1.727 m)   Wt 97.9 kg   SpO2 100%   BMI 32.82 kg/m   Intake/Output Summary (Last 24 hours) at 04/02/2019 1204 Last data filed at 04/02/2019 1100 Gross per 24 hour  Intake 3900.2 ml  Output 8129 ml  Net -4228.8 ml   Intake/Output: I/O last 3 completed shifts: In: 5757.5 [I.V.:2288.8; Blood:630; Other:800.9; LF/YB:0175.1; IV Piggyback:400.1] Out: 02585 [Urine:805; Emesis/NG output:50; IDPOE:42353]  Intake/Output this shift:  Total I/O In: 622.8 [I.V.:247.5; Other:55.3; NG/GT:220; IV Piggyback:100] Out: 1982 [Urine:20; Other:1962] Weight change: -4 kg Gen: frail, elderly WM who appears critically ill CVS: mechanical hum around precordium Resp: occ exp wheezes bilaterally Abd: +BS, soft, NT Ext: 2+ edema bilateral lower extremities and presacral area.  Recent Labs  Lab 03/26/19 1437 03/27/19 0514 03/27/19 1255 03/27/19 1305 03/27/19 1600 03/27/19 1621 03/28/19 0417 03/28/19 0432 03/28/19 1532 03/28/19 1539 03/29/19 0417 03/29/19 0425 03/29/19 1541 03/29/19 1552 03/30/19 0255 03/30/19 0546 03/30/19 1510 03/30/19 1510 03/31/19 0408 03/31/19 1510 04/01/19 0400 04/01/19 0455 04/01/19 1844 04/02/19 0349 04/02/19 0450  NA 136   < > 134*   < > 134*   < > 133*   < > 131*   < > 130*   < > 131*   < > 132*   < > 132*   < > 133*  134* 134* 134* 134* 134* 136 134*  K 4.1   < > 3.9   < > 3.8   < > 3.8   < > 3.7   < > 3.7   < > 3.7   < > 3.6   < > 4.1   < > 4.3  4.4 4.1 4.5 4.4 4.4 4.3 4.7  CL 91*   < > 95*   < > 95*   < > 93*   < > 91*   < > 92*   < > 92*   < > 96*  --  97*  --  99 100 100  --  102 101  --   CO2 25   < > 22   < > 21*   < > 21*   < > 20*   < > 20*   < > 20*   < > 22  --  23  --  _0 --  22 25  --   GLUCOSE 196*   < > 156*   < > 164*   < >  124*   < > 180*   < > 196*   < > 175*   < > 163*  --  151*  --  182* 194* 187*  --  200* 188*  --   BUN 149*   < > 167*   < > 166*   < > 168*   < > 177*   < > 191*   < > 172*   < > 130*  --  97*  --  70* 58* 49*  --  47* 43*  --   CREATININE 3.41*   < > 4.19*   < > 4.15*   < > 4.43*   < > 4.61*   < > 5.04*   < > 4.43*   < >  3.47*  --  2.73*  --  2.32* 2.03* 1.89*  --  1.79* 1.83*  --   ALBUMIN 2.4*   < > 2.6*   < > 2.6*   < > 2.4*   < > 2.2*   < > 2.1*   < > 2.2*   < > 2.0*  --  2.0*  --  2.0* 2.0* 2.0*  --  2.0* 2.0*  --   CALCIUM 7.8*   < > 7.4*   < > 7.4*   < > 7.6*   < > 7.5*   < > 7.5*   < > 7.5*   < > 7.5*  --  7.4*  --  7.8* 7.7* 7.9*  --  7.6* 7.9*  --   PHOS 6.3*  --   --   --  7.9*  --   --   --  8.1*  --   --   --  8.5*  --   --   --  4.6  --   --  3.3  --   --  2.3*  --   --   AST  --    < > 85*  --   --   --  73*  --   --   --  52*  --   --   --  47*  --   --   --  47*  --  35  --   --  35  --   ALT  --    < > 81*  --   --   --  63*  --   --   --  32  --   --   --  24  --   --   --  24  --  20  --   --  20  --    < > = values in this interval not displayed.   Liver Function Tests: Recent Labs  Lab 03/31/19 0408 03/31/19 1510 04/01/19 0400 04/01/19 1844 04/02/19 0349  AST 47*  --  35  --  35  ALT 24  --  20  --  20  ALKPHOS 65  --  66  --  74  BILITOT 0.7  --  0.6  --  0.9  PROT 5.7*  --  5.5*  --  5.5*  ALBUMIN 2.0*   < > 2.0* 2.0* 2.0*   < > = values in this interval not displayed.   No results for input(s): LIPASE, AMYLASE in the last 168 hours. No results for input(s): AMMONIA in the last 168 hours. CBC: Recent Labs  Lab 03/31/19 1510 03/31/19 1510 04/01/19 0400 04/01/19 0455 04/01/19 1232 04/01/19 1232 04/01/19 2004 04/02/19 0349 04/02/19 0450  WBC 18.2*   < > 22.0*   < > 24.9*  --  24.9* 30.6*  --   HGB 8.7*   < > 8.0*   < > 7.6*   < > 10.2* 10.0* 10.2*  HCT 27.7*   < > 25.4*   < > 24.7*   < > 31.5* 30.4* 30.0*  MCV 89.9  --  90.4  --  91.5  --  89.5 90.5   --   PLT 187   < > 171   < > 178  --  169 158  --    < > = values in this interval not displayed.   Cardiac Enzymes: No results for input(s): CKTOTAL, CKMB, CKMBINDEX, TROPONINI in  the last 168 hours. CBG: Recent Labs  Lab 04/01/19 2012 04/02/19 0011 04/02/19 0354 04/02/19 0725 04/02/19 1113  GLUCAP 170* 173* 170* 192* 173*    Iron Studies: No results for input(s): IRON, TIBC, TRANSFERRIN, FERRITIN in the last 72 hours. Studies/Results: DG CHEST PORT 1 VIEW  Result Date: 04/02/2019 CLINICAL DATA:  History of CABG. EXAM: PORTABLE CHEST 1 VIEW COMPARISON:  Chest radiograph 04/01/2019 FINDINGS: Tracheostomy tube terminates at the level of the clavicular heads. Unchanged position of a right IJ sheath, left subclavian catheter, right PICC and right subclavian approach Impella device. An enteric tube passes below the level of left hemidiaphragm with tip excluded from the field of view. Sequela of prior CABG and left atrial appendage clipping. The cardiomediastinal silhouette is unchanged. Unchanged layering bilateral pleural effusions. Redemonstrated atelectasis and/or consolidation within the left lung base. No evidence of pneumothorax. No acute bony abnormality. IMPRESSION: Support apparatus as described. Layering bilateral pleural effusions with left basilar atelectasis and/or pneumonia, similar to prior examination. Electronically Signed   By: Kellie Simmering DO   On: 04/02/2019 07:59   DG Chest Port 1 View  Result Date: 04/01/2019 CLINICAL DATA:  Left ventricular assist device in place. EXAM: PORTABLE CHEST 1 VIEW COMPARISON:  03/31/2019 FINDINGS: Tracheostomy tube overlies the airway. A right jugular sheath, left subclavian catheter, right PICC, and right subclavian approach Impella device remain in place. Enteric tubes course into the abdomen with tips not imaged. Sequelae of CABG and left atrial appendage clipping are again identified. The cardiomediastinal silhouette is unchanged. Veiling  opacities in the lung bases, left greater than right, suggest small pleural effusions and appear improved from the prior study although this may also reflect differences in patient positioning. There is persistent asymmetric atelectasis or consolidation in the left lung base. No pneumothorax is identified. IMPRESSION: 1. Support devices as above. 2. Pleural effusions and bibasilar atelectasis with overall improved lung aeration since yesterday. Persistent asymmetric left basilar atelectasis or consolidation. Electronically Signed   By: Logan Bores M.D.   On: 04/01/2019 09:22   . aspirin  81 mg Per Tube Daily  . atorvastatin  80 mg Per Tube q1800  . B-complex with vitamin C  1 tablet Oral Daily  . bisacodyl  10 mg Oral Daily   Or  . bisacodyl  10 mg Rectal Daily  . chlorhexidine gluconate (MEDLINE KIT)  15 mL Mouth Rinse BID  . Chlorhexidine Gluconate Cloth  6 each Topical Daily  . docusate  200 mg Per Tube Daily  . feeding supplement (PRO-STAT SUGAR FREE 64)  60 mL Per Tube TID  . Gerhardt's butt cream   Topical BID  . insulin aspart  0-24 Units Subcutaneous Q4H  . insulin aspart  3 Units Subcutaneous Q4H  . [START ON 04/03/2019] insulin glargine  24 Units Subcutaneous Daily  . mouth rinse  15 mL Mouth Rinse 10 times per day  . pantoprazole (PROTONIX) IV  40 mg Intravenous Q24H  . sodium chloride flush  10-40 mL Intracatheter Q12H  . tamsulosin  0.4 mg Oral Daily    BMET    Component Value Date/Time   NA 134 (L) 04/02/2019 0450   K 4.7 04/02/2019 0450   CL 101 04/02/2019 0349   CO2 25 04/02/2019 0349   GLUCOSE 188 (H) 04/02/2019 0349   BUN 43 (H) 04/02/2019 0349   CREATININE 1.83 (H) 04/02/2019 0349   CALCIUM 7.9 (L) 04/02/2019 0349   GFRNONAA 32 (L) 04/02/2019 0349   GFRAA 37 (L)  04/02/2019 0349   CBC    Component Value Date/Time   WBC 30.6 (H) 04/02/2019 0349   RBC 3.36 (L) 04/02/2019 0349   HGB 10.2 (L) 04/02/2019 0450   HCT 30.0 (L) 04/02/2019 0450   PLT 158 04/02/2019  0349   MCV 90.5 04/02/2019 0349   MCH 29.8 04/02/2019 0349   MCHC 32.9 04/02/2019 0349   RDW 15.7 (H) 04/02/2019 0349   LYMPHSABS 0.6 (L) 03/13/2019 1056   MONOABS 0.5 03/13/2019 1056   EOSABS 0.1 03/13/2019 1056   BASOSABS 0.0 03/13/2019 1056     Assessment/Plan:  1. AKI (Cr was 0.7 prior to surgery)- due to postoperative CHF/cardiogenic shock that was refractory to diuretics and started on CRRT on 03/29/19 for volume removal.  Tolerating CRRT with volume removal of 200 ml/hr.  UF limited by low BP but is down 9 kg since starting CRRT.  Will continue with UF as tolerated but starting to have issues with hypotension.  Still about 22 lbs above his lowest weight in the hospital.    2. Acute systolic CHF/cardiogenic shock- EF 25-30% currently on Impella.  Continue with CVVHD and UF as tolerated as he remains volume overloaded. 3. CAD s/p CABG on 9/98/33 complicated by cardiogenic shock and ARF. 4. Acute hypoxic respiratory failure with pseudomonas pneumonia- s/p trach on 03/27/19.  Cont with fortaz per PCCM 5. PAF- s/p MAZE and LAA occlusion- on amio per CHF team 6. Hematuria- bladder irrigation 7. ABLA-  Has Melena.  Transfuse as needed.  Received 2 units PRBC's overnight with appropriate increase in Hgb from 7.6 to 10.2.  Continue to follow H/H and transfuse prn.  8. Protein malnutrition- moderate to severe 9. Vascular access- left [place] HD catheter placed 03/29/19 10. Disposition- poor overall prognosis.  He is not a longterm dialysis candidate given his cardiogenic shock and trach.  Recommend palliative care consult to help set goals/limits of care.  Donetta Potts, MD [employer] [phone]

## 2019-04-02 NOTE — Progress Notes (Addendum)
TCTS BRIEF SICU PROGRESS NOTE  6 Days Post-Op  S/P Procedure(s) (LRB): TRACHEOSTOMY placed using [place] 8DCT Cuffed. (N/A) Placement Of Impella Left Ventricular Assist Device using ABIOMED Impella 5.5 with SmartAssist Device. (Right)   Afib w/ HR 111 MAP 55-60 on escalating dose levophed Impella flows stable P6 No longer trying to pull volume on CVVHD Foley catheter replaced by Urology to facilitate irrigation  Plan: Continue current plan  Rexene Alberts, MD 04/02/2019 5:44 PM  ]

## 2019-04-03 ENCOUNTER — Inpatient Hospital Stay (HOSPITAL_COMMUNITY): Payer: Medicare Other

## 2019-04-03 LAB — GLUCOSE, CAPILLARY
Glucose-Capillary: 183 mg/dL — ABNORMAL HIGH (ref 70–99)
Glucose-Capillary: 166 mg/dL — ABNORMAL HIGH (ref 70–99)
Glucose-Capillary: 151 mg/dL — ABNORMAL HIGH (ref 70–99)
Glucose-Capillary: 198 mg/dL — ABNORMAL HIGH (ref 70–99)
Glucose-Capillary: 150 mg/dL — ABNORMAL HIGH (ref 70–99)
Glucose-Capillary: 146 mg/dL — ABNORMAL HIGH (ref 70–99)

## 2019-04-03 LAB — COMPREHENSIVE METABOLIC PANEL
ALT: 24 U/L (ref 0–44)
AST: 33 U/L (ref 15–41)
Albumin: 1.9 g/dL — ABNORMAL LOW (ref 3.5–5.0)
Alkaline Phosphatase: 76 U/L (ref 38–126)
Anion gap: 11 (ref 5–15)
BUN: 40 mg/dL — ABNORMAL HIGH (ref 8–23)
CO2: 25 mmol/L (ref 22–32)
Calcium: 7.6 mg/dL — ABNORMAL LOW (ref 8.9–10.3)
Chloride: 97 mmol/L — ABNORMAL LOW (ref 98–111)
Creatinine, Ser: 1.78 mg/dL — ABNORMAL HIGH (ref 0.61–1.24)
GFR calc Af Amer: 39 mL/min — ABNORMAL LOW (ref >60)
GFR calc non Af Amer: 33 mL/min — ABNORMAL LOW (ref >60)
Glucose, Bld: 253 mg/dL — ABNORMAL HIGH (ref 70–99)
Potassium: 4.2 mmol/L (ref 3.5–5.1)
Sodium: 133 mmol/L — ABNORMAL LOW (ref 135–145)
Total Bilirubin: 0.9 mg/dL (ref 0.3–1.2)
Total Protein: 5.3 g/dL — ABNORMAL LOW (ref 6.5–8.1)

## 2019-04-03 LAB — POCT I-STAT 7, (LYTES, BLD GAS, ICA,H+H)
Bicarbonate: 24.2 mmol/L (ref 20.0–28.0)
Calcium, Ion: 1.05 mmol/L — ABNORMAL LOW (ref 1.15–1.40)
HCT: 25 % — ABNORMAL LOW (ref 39.0–52.0)
Hemoglobin: 8.5 g/dL — ABNORMAL LOW (ref 13.0–17.0)
O2 Saturation: 95 %
Patient temperature: 98.3
Potassium: 4.2 mmol/L (ref 3.5–5.1)
Sodium: 136 mmol/L (ref 135–145)
TCO2: 25 mmol/L (ref 22–32)
pCO2 arterial: 35 mmHg (ref 32.0–48.0)
pH, Arterial: 7.447 (ref 7.350–7.450)
pO2, Arterial: 70 mmHg — ABNORMAL LOW (ref 83.0–108.0)

## 2019-04-03 LAB — RENAL FUNCTION PANEL
Albumin: 1.8 g/dL — ABNORMAL LOW (ref 3.5–5.0)
Anion gap: 11 (ref 5–15)
BUN: 43 mg/dL — ABNORMAL HIGH (ref 8–23)
CO2: 22 mmol/L (ref 22–32)
Calcium: 7.6 mg/dL — ABNORMAL LOW (ref 8.9–10.3)
Chloride: 100 mmol/L (ref 98–111)
Creatinine, Ser: 1.86 mg/dL — ABNORMAL HIGH (ref 0.61–1.24)
GFR calc Af Amer: 37 mL/min — ABNORMAL LOW (ref >60)
GFR calc non Af Amer: 32 mL/min — ABNORMAL LOW (ref >60)
Glucose, Bld: 201 mg/dL — ABNORMAL HIGH (ref 70–99)
Phosphorus: 2.2 mg/dL — ABNORMAL LOW (ref 2.5–4.6)
Potassium: 4.6 mmol/L (ref 3.5–5.1)
Sodium: 133 mmol/L — ABNORMAL LOW (ref 135–145)

## 2019-04-03 LAB — LACTATE DEHYDROGENASE: LDH: 265 U/L — ABNORMAL HIGH (ref 98–192)

## 2019-04-03 LAB — COOXEMETRY PANEL
Carboxyhemoglobin: 1.5 % (ref 0.5–1.5)
Methemoglobin: 0.9 % (ref 0.0–1.5)
O2 Saturation: 69.1 %
Total hemoglobin: 12.7 g/dL (ref 12.0–16.0)

## 2019-04-03 LAB — CBC
HCT: 28.2 % — ABNORMAL LOW (ref 39.0–52.0)
HCT: 26.9 % — ABNORMAL LOW (ref 39.0–52.0)
Hemoglobin: 8.9 g/dL — ABNORMAL LOW (ref 13.0–17.0)
Hemoglobin: 8.4 g/dL — ABNORMAL LOW (ref 13.0–17.0)
MCH: 29.1 pg (ref 26.0–34.0)
MCH: 29.4 pg (ref 26.0–34.0)
MCHC: 31.6 g/dL (ref 30.0–36.0)
MCHC: 31.2 g/dL (ref 30.0–36.0)
MCV: 92.2 fL (ref 80.0–100.0)
MCV: 94.1 fL (ref 80.0–100.0)
Platelets: 134 10*3/uL — ABNORMAL LOW (ref 150–400)
Platelets: 127 10*3/uL — ABNORMAL LOW (ref 150–400)
RBC: 3.06 MIL/uL — ABNORMAL LOW (ref 4.22–5.81)
RBC: 2.86 MIL/uL — ABNORMAL LOW (ref 4.22–5.81)
RDW: 16.2 % — ABNORMAL HIGH (ref 11.5–15.5)
RDW: 16.5 % — ABNORMAL HIGH (ref 11.5–15.5)
WBC: 32.3 10*3/uL — ABNORMAL HIGH (ref 4.0–10.5)
WBC: 36.1 10*3/uL — ABNORMAL HIGH (ref 4.0–10.5)
nRBC: 0.4 % — ABNORMAL HIGH (ref 0.0–0.2)
nRBC: 0.6 % — ABNORMAL HIGH (ref 0.0–0.2)

## 2019-04-03 LAB — HEPARIN LEVEL (UNFRACTIONATED)
Heparin Unfractionated: 0.18 IU/mL — ABNORMAL LOW (ref 0.30–0.70)
Heparin Unfractionated: 0.22 IU/mL — ABNORMAL LOW (ref 0.30–0.70)

## 2019-04-03 LAB — APTT: aPTT: 90 seconds — ABNORMAL HIGH (ref 24–36)

## 2019-04-03 LAB — MAGNESIUM: Magnesium: 2.5 mg/dL — ABNORMAL HIGH (ref 1.7–2.4)

## 2019-04-03 MED ORDER — SODIUM PHOSPHATES 45 MMOLE/15ML IV SOLN
20.0000 mmol | Freq: Once | INTRAVENOUS | Status: AC
  Administered 2019-04-03: 20 mmol via INTRAVENOUS
  Filled 2019-04-03: qty 6.67

## 2019-04-03 MED ORDER — VANCOMYCIN HCL 1250 MG/250ML IV SOLN
1250.0000 mg | INTRAVENOUS | Status: DC
  Administered 2019-04-03: 1250 mg via INTRAVENOUS
  Filled 2019-04-03: qty 250

## 2019-04-03 MED ORDER — VANCOMYCIN HCL 2000 MG/400ML IV SOLN
2000.0000 mg | Freq: Once | INTRAVENOUS | Status: AC
  Administered 2019-04-03: 2000 mg via INTRAVENOUS
  Filled 2019-04-03: qty 400

## 2019-04-03 MED ORDER — INSULIN ASPART 100 UNIT/ML ~~LOC~~ SOLN
5.0000 [IU] | SUBCUTANEOUS | Status: DC
  Administered 2019-04-03 – 2019-04-13 (×43): 5 [IU] via SUBCUTANEOUS

## 2019-04-03 MED ORDER — MIDODRINE HCL 5 MG PO TABS
10.0000 mg | ORAL_TABLET | Freq: Three times a day (TID) | ORAL | Status: DC
  Administered 2019-04-03 – 2019-04-14 (×28): 10 mg via ORAL
  Filled 2019-04-03 (×29): qty 2

## 2019-04-03 MED ORDER — AMIODARONE IV BOLUS ONLY 150 MG/100ML
150.0000 mg | Freq: Once | INTRAVENOUS | Status: AC
  Administered 2019-04-03: 150 mg via INTRAVENOUS

## 2019-04-03 MED ORDER — INSULIN GLARGINE 100 UNIT/ML ~~LOC~~ SOLN
26.0000 [IU] | Freq: Every day | SUBCUTANEOUS | Status: DC
  Administered 2019-04-04 – 2019-04-10 (×7): 26 [IU] via SUBCUTANEOUS
  Filled 2019-04-03 (×8): qty 0.26

## 2019-04-03 MED ORDER — ALBUMIN HUMAN 25 % IV SOLN
12.5000 g | INTRAVENOUS | Status: AC
  Administered 2019-04-03 – 2019-04-04 (×6): 12.5 g via INTRAVENOUS
  Filled 2019-04-03 (×5): qty 50

## 2019-04-03 NOTE — Progress Notes (Signed)
[facility] for Anticoagulation management/assistance  Indication: Impella 5.5  No Known Allergies  Patient Measurements: Height: 5\' 8"  (172.7 cm) Weight: 215 lb 13.3 oz (97.9 kg) IBW/kg (Calculated) : 68.4 Heparin Dosing Weight: 86kg  Vital Signs: Temp: 98.5 F (36.9 C) (02/16 0000) Temp Source: Oral (02/16 0000) BP: 109/55 (02/15 2024) Pulse Rate: 97 (02/16 0000)  Labs: Recent Labs    04/02/19 0349 04/02/19 0450 04/02/19 1242 04/02/19 1242 04/02/19 1348 04/02/19 1612 04/02/19 2300 04/02/19 2331  HGB 10.0*   < > 9.9*   < >  --  9.4*  --  9.9*  HCT 30.4*   < > 31.4*  --   --  29.4*  --  29.0*  PLT 158  --  146*  --   --  135*  --   --   APTT 156*  --   --   --   --   --   --   --   HEPARINUNFRC 0.35  --   --   --  0.42  --  0.11*  --   CREATININE 1.83*  --   --   --   --  1.75*  --  1.70*   < > = values in this interval not displayed.    Estimated Creatinine Clearance: 34.1 mL/min (A) (by C-G formula based on SCr of 1.7 mg/dL (H)).  Assessment: 86 year old male s/p CABG 1/29 with MAZE and LAA. Patient taken to OR 2/9 for tracheostomy and impella 5.5 placement.   Purge is heparin half/strength (25 units/ml) infusing ~13.5 ml/h (~340 units/h). Systemic heparin infusion stopped last night- receiving 1150 units/hr in CRRT circuit. Heparin level came back therapeutic at 0.35. Hgb stable at 10.2, s/p 2 PRBCs since yesterday. Plt 186. LDH 247. Continues to have melena stool.   2/16 AM update: -Heparin level is now low after stopping heparin through CRRT and starting systemic heparin at 500 units/hr -Melena/bloody urine remain stable -Hgb lower but stable  Goal of Therapy:  Heparin level ~0.3 units/mL per Dr. VT Monitor platelets by anticoagulation protocol: Yes   Plan:   Continue 1/2 strength heparin purge per [employer] systemic heparin to 650 units/hr (max systemic heparin rate 1000 units/hr per Dr. Prescott Gum) Re-check heparin  level in 8 hours  Narda Bonds, PharmD, [facility] Pharmacist Phone: [phone]

## 2019-04-03 NOTE — Progress Notes (Signed)
[facility] for Anticoagulation management/assistance  Indication: Impella 5.5  No Known Allergies  Patient Measurements: Height: 5\' 8"  (172.7 cm) Weight: 209 lb 10.5 oz (95.1 kg) IBW/kg (Calculated) : 68.4 Heparin Dosing Weight: 86kg  Vital Signs: Temp: 98 F (36.7 C) (02/16 0812) Temp Source: Oral (02/16 0812) BP: 128/57 (02/16 0741) Pulse Rate: 82 (02/16 0800)  Labs: Recent Labs    04/02/19 0349 04/02/19 0349 04/02/19 0450 04/02/19 1242 04/02/19 1242 04/02/19 1348 04/02/19 1612 04/02/19 1612 04/02/19 2300 04/02/19 2331 04/02/19 2331 04/03/19 0406 04/03/19 0501 04/03/19 0548 04/03/19 0733  HGB 10.0*  --    < > 9.9*   < >  --  9.4*   < >  --  9.9*   < > 8.9* 8.5*  --   --   HCT 30.4*  --    < > 31.4*   < >  --  29.4*   < >  --  29.0*  --  28.2* 25.0*  --   --   PLT 158  --    < > 146*  --   --  135*  --   --   --   --  134*  --   --   --   APTT 156*  --   --   --   --   --   --   --   --   --   --  90*  --   --   --   HEPARINUNFRC 0.35   < >  --   --   --  0.42  --   --  0.11*  --   --   --   --   --  0.18*  CREATININE 1.83*   < >  --   --   --   --  1.75*  --   --  1.70*  --   --   --  1.78*  --    < > = values in this interval not displayed.    Estimated Creatinine Clearance: 32.1 mL/min (A) (by C-G formula based on SCr of 1.78 mg/dL (H)).  Assessment: 86 year old male s/p CABG 1/29 with MAZE and LAA. Patient taken to OR 2/9 for tracheostomy and impella 5.5 placement.   Purge is heparin half/strength (25 units/ml) infusing ~13.5 ml/h (~340 units/h). Restarted on systemic heparin on 2/15 in PM (no heparin in CRRT circuit)- currently running at 650 units/hr. Heparin level this morning came back at 0.18 (subtherapeutic). Hgb trending down from 8.5, plt 134. LDH 265.  Continues to have melena stool.   Discussed with Dr Prescott Gum, will continue same rate of heparin systemic and monitor through the day for bleeding.   Goal of  Therapy:  Heparin level ~0.3 units/mL per Dr. VT Monitor platelets by anticoagulation protocol: Yes   Plan:   Continue 1/2 strength heparin purge per MD Continue systemic heparin at 650 units/hr (max systemic heparin rate 1000 units/hr per Dr. Prescott Gum) Re-check heparin level in 8 hours  Antonietta Jewel, PharmD, [facility] Pharmacist  Phone: [phone]  Please check AMION for all [facility] phone numbers After 10:00 PM, call [facility] [phone]

## 2019-04-03 NOTE — Progress Notes (Signed)
Patient ID: Jesse Murphy, male   DOB: 09/03/1928, 86 y.o.   MRN: [id number] EVENING ROUNDS NOTE :     [address]       [place] [address]             [phone]                 7 Days Post-Op Procedure(s) (LRB): TRACHEOSTOMY placed using Shiley 8DCT Cuffed. (N/A) Placement Of Impella Left Ventricular Assist Device using ABIOMED Impella 5.5 with SmartAssist Device. (Right)  Total Length of Stay:  LOS: 20 days  BP (!) 115/51   Pulse 89   Temp 98.7 F (37.1 C) (Oral)   Resp (!) 23   Ht 5\' 8"  (1.727 m)   Wt 95.1 kg   SpO2 95%   BMI 31.88 kg/m   .Intake/Output      02/15 0701 - 02/16 0700 02/16 0701 - 02/17 0700   I.V. (mL/kg) 2101 (22.1) 792.5 (8.3)   Blood     Other 326.1 136   NG/GT 1230 340   IV Piggyback 200.1 552.7   Total Intake(mL/kg) 3857.2 (40.6) 1821.2 (19.2)   Urine (mL/kg/hr) 160 (0.1)    Emesis/NG output 50    Other 5046 2406   Stool 0    Total Output 5256 2406   Net -1398.8 -584.8        Stool Occurrence 3 x      .  prismasol BGK 4/2.5 400 mL/hr at 04/03/19 0518  .  prismasol BGK 4/2.5 200 mL/hr at 04/03/19 0000  . sodium chloride    . sodium chloride Stopped (04/03/19 1531)  . sodium chloride 10 mL/hr at 03/29/19 2128  . albumin human 12.5 g (04/03/19 1630)  . amiodarone 60 mg/hr (04/03/19 1700)  . cefTAZidime (FORTAZ)  IV Stopped (04/03/19 1232)  . feeding supplement (VITAL 1.5 CAL) 1,000 mL (04/02/19 0813)  . impella catheter heparin 25 unit/mL in dextrose 5%    . heparin 650 Units/hr (04/03/19 1700)  . norepinephrine (LEVOPHED) Adult infusion Stopped (04/03/19 1354)  . prismasol BGK 4/2.5 1,500 mL/hr at 04/03/19 0126  . sodium phosphate  Dextrose 5% IVPB    . [START ON 04/04/2019] vancomycin    . vasopressin (PITRESSIN) infusion - *FOR SHOCK* 0.04 Units/min (04/03/19 1700)     Lab Results  Component Value Date   WBC 36.1 (H) 04/03/2019   HGB 8.4 (L) 04/03/2019   HCT 26.9 (L) 04/03/2019   PLT 127 (L) 04/03/2019   GLUCOSE 201 (H) 04/03/2019   CHOL 117 03/16/2019   TRIG 52 03/16/2019   HDL 33 (L) 03/16/2019   LDLCALC 74 03/16/2019   ALT 24 04/03/2019   AST 33 04/03/2019   NA 133 (L) 04/03/2019   K 4.6 04/03/2019   CL 100 04/03/2019   CREATININE 1.86 (H) 04/03/2019   BUN 43 (H) 04/03/2019   CO2 22 04/03/2019   TSH 3.683 03/13/2019   INR 1.7 (H) 03/27/2019   HGBA1C 6.1 (H) 03/13/2019   remains on vent ,cvvh and impella afib with rapid rate   Grace Isaac MD  [facility] [phone] Office [phone] 04/03/2019 6:26 PM

## 2019-04-03 NOTE — Progress Notes (Signed)
NAME:  Jesse Murphy, MRN:  [id number], DOB:  [birth year], LOS: 3 ADMISSION DATE:  03/13/2019, CONSULTATION DATE:  2/2 REFERRING MD:  Nils Pyle, CHIEF COMPLAINT:  Dyspnea   Brief History   86 y/o male admitted 1/26, underwent CABG and L atrial appendage clipping on 1/29, moved to ICU on 2/1 for intubation.  Past Medical History  A fib, CAD, Prostate cancer, HTN  Significant Hospital Events   1/29 CABG 2/01 to ICU 2/05 Extubated 2/08 Reintubated 2/09 tracheostomy 2/09 Impella, PA catheter placed 2/11 CRRT initiated  Consults:  Nephrology CHF team  Procedures:  Rt PICC 2/02 >> Trach 2/09 >> PA catheter 2/09 >> Lt [place] HD cath 2/11 >> L Rad [place] 2/3 >>  Impella 2/10 >>  Significant Diagnostic Tests:  Echo 1/26 >> EF 30 to 35%  Micro Data:  SARS CoV2 PCR 1/26 >> negative Influenza PCR 1/26 >> negative Sputum 2/01 >> oral flora Sputum 2/08 >> Pseudomonas >> pan sensitive   Antimicrobials:  Rocephin 2/01 >> 2/07 Cefepime 2/08 >> 2/10 Fortaz 2/10 >>   Interim history/subjective:  Tmax 99.1 / WBC 32.2  Glucose range 150-253 I/O - UOP 178ml, 5L removed with CVVHD, net neg 1.4L in 24h RN reports 2 bloody BM overnight > Hgb stable 8.9  Objective   Blood pressure (!) 128/57, pulse 82, temperature 98 F (36.7 C), temperature source Oral, resp. rate 20, height 5\' 8"  (1.727 m), weight 95.1 kg, SpO2 97 %. CVP:  [4 mmHg-14 mmHg] 8 mmHg CO:  [6.4 L/min] 6.4 L/min CI:  [2.9 L/min/m2] 2.9 L/min/m2  Vent Mode: PSV;CPAP FiO2 (%):  [40 %] 40 % Set Rate:  [18 bmp] 18 bmp Vt Set:  [540 mL] 540 mL PEEP:  [5 cmH20] 5 cmH20 Pressure Support:  [5 cmH20-10 cmH20] 5 cmH20 Plateau Pressure:  [15 cmH20-19 cmH20] 15 cmH20   Intake/Output Summary (Last 24 hours) at 04/03/2019 1002 Last data filed at 04/03/2019 0900 Gross per 24 hour  Intake 3747.22 ml  Output 4281 ml  Net -533.78 ml   Filed Weights   04/01/19 0500 04/02/19 0500 04/03/19 0500  Weight: 101.9 kg 97.9 kg 95.1 kg     Examination: General: elderly male lying in bed, critically ill appearing but in NAD HEENT: MM pink/moist, ETT, R IJ catheter, L [place] HD cath Neuro: AAOx4, interactive / appropriate, generalized weakness but moves all extremities  CV: s1s2 RRR, no m/r/g PULM:  Non-labored on vent, lungs bilaterally clear anterior, diminished bases GI: soft, bsx4 active  Extremities: warm/dry, trace BUE edema  Skin: no rashes or lesions, R Impella catheter c/d/i, RUE PICC   Resolved Hospital Problem list     Assessment & Plan:   Acute Hypoxemic Respiratory Failure due to Acute pulmonary edema complicated by Pseudomonas HCAP Bilateral Layering Pleural Effusions Smoker, wheezing, likely COPD? -continue ceftaz, D7/7  -follow intermittent CXR -PRVC 8cc/kg as rest mode  -daily SBT / WUA as tolerated, goal to ATC  -duoneb  -consider bilateral thoracentesis to sample pleural fluid given rising WBC, defer to CVTS  Cardiogenic Shock with acute systolic heart failure -levophed, Impella per CVTS / CHF service  -ICU monitoring  -continue levophed, vasopressin   Atrial fibrillation In SR, bleeding on heparin (rectal, foley) -tele monitoring  -heparin gtt per pharmacy, monitor for further bleeding   AKI secondary to Cardiogenic Shock -appreciate Nephrology  -volume status per Nephrology / CVTS  -Trend BMP / urinary output -Replace electrolytes as indicated -Avoid nephrotoxic agents, ensure adequate renal perfusion  Anemia of critical illness Melena, Bloody Urine  PRBC x2 2/14 -follow closely with hematuria, melena -heparin gtt per pharmacy  -ASA  -transfuse for Hgb <7%  DM2 with hyperglycemia, poorly controlled -SSI -increase lantus to 26 units QD -increase TF coverage to 5 units Q4   Acute metabolic encephalopathy due to shock, renal failure, hypoxemia -RASS Goal 0  -minimize all sedating medications as able  -delirium prevention measures -PT efforts  Pressure wounds / Reported as  Skin Tear 2/15 -WOC following   Leukocytosis  -note rising trend, multiple potential sources  -continue abx as above  Best practice:  Diet: TF Pain/Anxiety/Delirium protocol (if indicated): PAD protocol VAP protocol (if indicated): yes DVT prophylaxis: heparin infusion GI prophylaxis: PPI Glucose control: SSI, glargine Mobility: bed rest Code Status: full Family Communication: per Primary service Disposition: ICU  Labs   CBC: Recent Labs  Lab 04/01/19 2004 04/01/19 2004 04/02/19 0349 04/02/19 0450 04/02/19 1242 04/02/19 1612 04/02/19 2331 04/03/19 0406 04/03/19 0501  WBC 24.9*  --  30.6*  --  35.0* 33.3*  --  32.3*  --   HGB 10.2*   < > 10.0*   < > 9.9* 9.4* 9.9* 8.9* 8.5*  HCT 31.5*   < > 30.4*   < > 31.4* 29.4* 29.0* 28.2* 25.0*  MCV 89.5  --  90.5  --  93.2 90.7  --  92.2  --   PLT 169  --  158  --  146* 135*  --  134*  --    < > = values in this interval not displayed.    Basic Metabolic Panel: Recent Labs  Lab 03/29/19 1541 03/29/19 1552 03/30/19 0255 03/30/19 0546 03/30/19 1510 03/30/19 1510 03/31/19 0408 03/31/19 0408 03/31/19 1510 03/31/19 1510 04/01/19 0400 04/01/19 0455 04/01/19 1844 04/01/19 1844 04/02/19 0349 04/02/19 0349 04/02/19 0450 04/02/19 1612 04/02/19 2331 04/03/19 0406 04/03/19 0501 04/03/19 0548  NA 131*   < > 132*   < > 132*   < > 133*  134*   < > 134*   < > 134*   < > 134*   < > 136   < > 134* 134* 134*  --  136 133*  K 3.7   < > 3.6   < > 4.1   < > 4.3  4.4   < > 4.1   < > 4.5   < > 4.4   < > 4.3   < > 4.7 4.6 4.2  --  4.2 4.2  CL 92*   < > 96*   < > 97*   < > 99   < > 100   < > 100   < > 102  --  101  --   --  99 99  --   --  97*  CO2 20*   < > 22   < > 23   < > 23   < > 22   < > 24  --  22  --  25  --   --  23  --   --   --  25  GLUCOSE 175*   < > 163*   < > 151*   < > 182*   < > 194*   < > 187*   < > 200*  --  188*  --   --  205* 166*  --   --  253*  BUN 172*   < > 130*   < > 97*   < >  70*   < > 58*   < > 49*   < >  47*  --  43*  --   --  44* 40*  --   --  40*  CREATININE 4.43*   < > 3.47*   < > 2.73*   < > 2.32*   < > 2.03*   < > 1.89*   < > 1.79*  --  1.83*  --   --  1.75* 1.70*  --   --  1.78*  CALCIUM 7.5*   < > 7.5*   < > 7.4*   < > 7.8*   < > 7.7*   < > 7.9*  --  7.6*  --  7.9*  --   --  7.5*  --   --   --  7.6*  MG  --   --  2.4  --   --   --  2.5*  --   --   --  2.5*  --   --   --  2.5*  --   --   --   --  2.5*  --   --   PHOS 8.5*  --   --   --  4.6  --   --   --  3.3  --   --   --  2.3*  --   --   --   --  1.9*  --   --   --   --    < > = values in this interval not displayed.   GFR: Estimated Creatinine Clearance: 32.1 mL/min (A) (by C-G formula based on SCr of 1.78 mg/dL (H)). Recent Labs  Lab 03/27/19 1305 03/27/19 1605 03/28/19 0417 04/02/19 0349 04/02/19 1242 04/02/19 1612 04/03/19 0406  WBC  --   --    < > 30.6* 35.0* 33.3* 32.3*  LATICACIDVEN 1.0 0.9  --   --   --   --   --    < > = values in this interval not displayed.    Liver Function Tests: Recent Labs  Lab 03/30/19 0255 03/30/19 1510 03/31/19 0408 03/31/19 1510 04/01/19 0400 04/01/19 1844 04/02/19 0349 04/02/19 1612 04/03/19 0548  AST 47*  --  47*  --  35  --  35  --  33  ALT 24  --  24  --  20  --  20  --  24  ALKPHOS 62  --  65  --  66  --  74  --  76  BILITOT 0.6  --  0.7  --  0.6  --  0.9  --  0.9  PROT 5.4*  --  5.7*  --  5.5*  --  5.5*  --  5.3*  ALBUMIN 2.0*   < > 2.0*   < > 2.0* 2.0* 2.0* 2.0* 1.9*   < > = values in this interval not displayed.   No results for input(s): LIPASE, AMYLASE in the last 168 hours. No results for input(s): AMMONIA in the last 168 hours.  ABG    Component Value Date/Time   PHART 7.447 04/03/2019 0501   PCO2ART 35.0 04/03/2019 0501   PO2ART 70.0 (L) 04/03/2019 0501   HCO3 24.2 04/03/2019 0501   TCO2 25 04/03/2019 0501   ACIDBASEDEF 2.0 03/31/2019 0408   O2SAT 95.0 04/03/2019 0501     Coagulation Profile: Recent Labs  Lab 03/27/19 1255  INR 1.7*    Cardiac  Enzymes: No results for  input(s): CKTOTAL, CKMB, CKMBINDEX, TROPONINI in the last 168 hours.  HbA1C: Hgb A1c MFr Bld  Date/Time Value Ref Range Status  03/13/2019 12:51 PM 6.1 (H) 4.8 - 5.6 % Final    Comment:    (NOTE) Pre diabetes:          5.7%-6.4% Diabetes:              >6.4% Glycemic control for   <7.0% adults with diabetes   09/12/2018 09:36 AM 5.9 (H) 4.8 - 5.6 % Final    Comment:    (NOTE) Pre diabetes:          5.7%-6.4% Diabetes:              >6.4% Glycemic control for   <7.0% adults with diabetes     CBG: Recent Labs  Lab 04/02/19 1616 04/02/19 2033 04/02/19 2326 04/03/19 0416 04/03/19 0745  GLUCAP 206* 199* 154* 183* 166*       Critical care time: 32 minutes     Noe Gens, MSN, NP-C [facility] 04/03/2019, 10:02 AM   Please see [email or web address] for pager details.

## 2019-04-03 NOTE — Progress Notes (Signed)
Patient placed on CPAP/PSV of 5/5 at 0740.  Currently tolerating well.  If continues to do well will transition to trach collar.  Will continue to monitor.

## 2019-04-03 NOTE — Progress Notes (Signed)
Pharmacy Antibiotic Note  Jesse Murphy is a 86 y.o. male admitted on 03/13/2019 with sepsis.  Pharmacy has been consulted for vancomycin dosing.  Started on cefepime>ceftazidime since 2/8 for pseudomonas PNA. WBC increasing 32, afebrile. Remains on CRRT- Scr 1.78. On increasing amount of pressor support overnight.   Plan: Vancomycin 2g IV once then 1g IV every 24 hours while on CRRT  Monitor renal fx, cx results, clinical pic, and duration of therapy  Height: 5\' 8"  (172.7 cm) Weight: 209 lb 10.5 oz (95.1 kg) IBW/kg (Calculated) : 68.4  Temp (24hrs), Avg:98.4 F (36.9 C), Min:98 F (36.7 C), Max:99.1 F (37.3 C)  Recent Labs  Lab 03/27/19 1305 03/27/19 1600 03/27/19 1605 03/28/19 0417 04/01/19 1232 04/01/19 1844 04/01/19 2004 04/02/19 0349 04/02/19 1242 04/02/19 1612 04/02/19 2331 04/03/19 0406 04/03/19 0548  WBC  --   --   --    < >   < >  --  24.9* 30.6* 35.0* 33.3*  --  32.3*  --   CREATININE  --    < >  --    < >  --  1.79*  --  1.83*  --  1.75* 1.70*  --  1.78*  LATICACIDVEN 1.0  --  0.9  --   --   --   --   --   --   --   --   --   --    < > = values in this interval not displayed.    Estimated Creatinine Clearance: 32.1 mL/min (A) (by C-G formula based on SCr of 1.78 mg/dL (H)).    No Known Allergies  Antimicrobials this admission: CTX 2/1 >> 2/7 Cefepime 2/8 >> 2/10 Fortaz 2/10 >> (2/17) Vanc 2/16>>   Dose adjustments this admission: N/A  Microbiology results: 2/8 resp cx - Pseudomonas- R - cefepime (not included on report, S fortaz,cipro, gent, primaxin) 2/1 TA: normal flora 1/28 MRSA: neg 1/26 COVID/flu: neg  Thank you for allowing pharmacy to be a part of this patient's care.  Antonietta Jewel, PharmD, BCCCP Clinical Pharmacist  Phone: [phone]  Please check AMION for all [place] phone numbers After 10:00 PM, call [facility] [phone] 04/03/2019 9:54 AM

## 2019-04-03 NOTE — Progress Notes (Signed)
7 Days Post-Op Procedure(s) (LRB): TRACHEOSTOMY placed using Shiley 8DCT Cuffed. (N/A) Placement Of Impella Left Ventricular Assist Device using ABIOMED Impella 5.5 with SmartAssist Device. (Right) Subjective: Patient alert and more comfortable today Patient had 2 bloody stools today, hemoglobin at 4 PM 8.4, stable General edema is much improved, left pleural effusion is much improved on today's x-ray which I viewed. Objective: Vital signs in last 24 hours: Temp:  [98 F (36.7 C)-98.7 F (37.1 C)] 98.7 F (37.1 C) (02/16 1143) Pulse Rate:  [29-118] 89 (02/16 1700) Cardiac Rhythm: Atrial fibrillation (02/16 1715) Resp:  [15-38] 23 (02/16 1700) BP: (109-134)/(51-61) 115/51 (02/16 1517) SpO2:  [88 %-100 %] 95 % (02/16 1700) Arterial Line BP: (73-139)/(43-82) 115/82 (02/16 1700) FiO2 (%):  [40 %] 40 % (02/16 1518) Weight:  [95.1 kg] 95.1 kg (02/16 0500)  Hemodynamic parameters for last 24 hours: CVP:  [0 mmHg-14 mmHg] 8 mmHg CO:  [6.4 L/min-7 L/min] 7 L/min CI:  [2.9 L/min/m2-3.2 L/min/m2] 3.2 L/min/m2  Intake/Output from previous day: 02/15 0701 - 02/16 0700 In: 3857.2 [I.V.:2101; NG/GT:1230; IV Piggyback:200.1] Out: 5256 [Urine:160; Emesis/NG output:50] Intake/Output this shift: Total I/O In: 1821.2 [I.V.:792.5; Other:136; NG/GT:340; IV Piggyback:552.7] Out: 2406 [Other:2406]  Exam  Chronically ill male comfortable lying supine Impella cannula in the right sub axillary position, flow 3.8 L Controlled atrial fibrillation Extremities warm abdomen soft  Lab Results: Recent Labs    04/03/19 0406 04/03/19 0406 04/03/19 0501 04/03/19 1600  WBC 32.3*  --   --  36.1*  HGB 8.9*   < > 8.5* 8.4*  HCT 28.2*   < > 25.0* 26.9*  PLT 134*  --   --  127*   < > = values in this interval not displayed.   BMET:  Recent Labs    04/03/19 0548 04/03/19 1600  NA 133* 133*  K 4.2 4.6  CL 97* 100  CO2 25 22  GLUCOSE 253* 201*  BUN 40* 43*  CREATININE 1.78* 1.86*  CALCIUM 7.6*  7.6*    PT/INR: No results for input(s): LABPROT, INR in the last 72 hours. ABG    Component Value Date/Time   PHART 7.447 04/03/2019 0501   HCO3 24.2 04/03/2019 0501   TCO2 25 04/03/2019 0501   ACIDBASEDEF 2.0 03/31/2019 0408   O2SAT 95.0 04/03/2019 0501   CBG (last 3)  Recent Labs    04/03/19 0745 04/03/19 1149 04/03/19 1612  GLUCAP 166* 151* 198*    Assessment/Plan: S/P Procedure(s) (LRB): TRACHEOSTOMY placed using Shiley 8DCT Cuffed. (N/A) Placement Of Impella Left Ventricular Assist Device using ABIOMED Impella 5.5 with SmartAssist Device. (Right) With significant improvement in edema, pleural effusion, and weight down 20 pounds patient may be ready to start a slow Impella wean.  He and family wish to continue full support for least a few more days   LOS: 20 days    Tharon Aquas Trigt III 04/03/2019

## 2019-04-03 NOTE — Progress Notes (Addendum)
f    Advanced Heart Failure Rounding Note  PCP-Cardiologist: Kate Sable, MD   Subjective:    Remains on impella 5.5 support, P-6, Flow 3.8 CPO 0.9    Started CVVHD 2/11. UF remains at -200 but has been limited by low BP. Weight down another 9  pounds.   On vent through trach. Respiratory culture with pseudomonas on Fortaz  In/out AF on Amio 60 mg per hour.   Over the last 24 hours had 7 blood bowel movements. Received 2UPRBCs.   Remains on vasopressin 0.05 units + norepi 36 mcg.   Complaining of r foot and back pain.     Flo-trak: CO 5.9 CI 2.7  SVR 827   Impella 5.5: P-6 Flow 3.7   CPO  1.1   Objective:   Weight Range: 95.1 kg Body mass index is 31.88 kg/m.   Vital Signs:   Temp:  [98 F (36.7 C)-99.1 F (37.3 C)] 98 F (36.7 C) (02/16 0812) Pulse Rate:  [29-124] 82 (02/16 0800) Resp:  [14-31] 20 (02/16 0800) BP: (92-128)/(51-61) 128/57 (02/16 0741) SpO2:  [88 %-100 %] 97 % (02/16 0800) Arterial Line BP: (97-122)/(49-63) 121/56 (02/16 0800) FiO2 (%):  [40 %] 40 % (02/16 0800) Weight:  [95.1 kg] 95.1 kg (02/16 0500) Last BM Date: 03/31/19  Weight change: Filed Weights   04/01/19 0500 04/02/19 0500 04/03/19 0500  Weight: 101.9 kg 97.9 kg 95.1 kg    Intake/Output:   Intake/Output Summary (Last 24 hours) at 04/03/2019 0830 Last data filed at 04/03/2019 0800 Gross per 24 hour  Intake 3889.89 ml  Output 4771 ml  Net -881.11 ml      Physical Exam  CVP 8-9  General: On Vent, awake   HEENT: normal Neck: trach, supple. no JVD. Carotids 2+ bilat; no bruits. No lymphadenopathy or thryomegaly appreciated. Cor: PMI nondisplaced. Regular rate & rhythm. No rubs, gallops or murmurs. R axillary. L subclavian HD cath Lungs: clear Abdomen: soft, nontender, nondistended. No hepatosplenomegaly. No bruits or masses. Good bowel sounds. Extremities: no cyanosis, clubbing, rash, edema. RUE PICC.  Neuro: Awake follows commands. MAE x4  GU: Foley with hematuria    Skin: red rash on perianal skin with satellite lesions.    Telemetry   NSR with PVCs 60-70s   Labs    CBC Recent Labs    04/02/19 1612 04/02/19 2331 04/03/19 0406 04/03/19 0501  WBC 33.3*  --  32.3*  --   HGB 9.4*   < > 8.9* 8.5*  HCT 29.4*   < > 28.2* 25.0*  MCV 90.7  --  92.2  --   PLT 135*  --  134*  --    < > = values in this interval not displayed.   Basic Metabolic Panel Recent Labs    04/01/19 1844 04/01/19 1844 04/02/19 0349 04/02/19 0450 04/02/19 1612 04/02/19 1612 04/02/19 2331 04/02/19 2331 04/03/19 0406 04/03/19 0501 04/03/19 0548  NA 134*   < > 136   < > 134*   < > 134*   < >  --  136 133*  K 4.4   < > 4.3   < > 4.6   < > 4.2   < >  --  4.2 4.2  CL 102   < > 101   < > 99   < > 99  --   --   --  97*  CO2 22   < > 25   < > 23  --   --   --   --   --  25  GLUCOSE 200*   < > 188*   < > 205*   < > 166*  --   --   --  253*  BUN 47*   < > 43*   < > 44*   < > 40*  --   --   --  40*  CREATININE 1.79*   < > 1.83*   < > 1.75*   < > 1.70*  --   --   --  1.78*  CALCIUM 7.6*   < > 7.9*   < > 7.5*  --   --   --   --   --  7.6*  MG  --   --  2.5*  --   --   --   --   --  2.5*  --   --   PHOS 2.3*  --   --   --  1.9*  --   --   --   --   --   --    < > = values in this interval not displayed.   Liver Function Tests Recent Labs    04/02/19 0349 04/02/19 0349 04/02/19 1612 04/03/19 0548  AST 35  --   --  33  ALT 20  --   --  24  ALKPHOS 74  --   --  76  BILITOT 0.9  --   --  0.9  PROT 5.5*  --   --  5.3*  ALBUMIN 2.0*   < > 2.0* 1.9*   < > = values in this interval not displayed.   No results for input(s): LIPASE, AMYLASE in the last 72 hours. Cardiac Enzymes No results for input(s): CKTOTAL, CKMB, CKMBINDEX, TROPONINI in the last 72 hours.  BNP: BNP (last 3 results) Recent Labs    03/22/19 0401 03/26/19 0626 03/27/19 1255  BNP 340.4* 687.5* 800.4*    ProBNP (last 3 results) No results for input(s): PROBNP in the last 8760  hours.   D-Dimer No results for input(s): DDIMER in the last 72 hours. Hemoglobin A1C No results for input(s): HGBA1C in the last 72 hours. Fasting Lipid Panel No results for input(s): CHOL, HDL, LDLCALC, TRIG, CHOLHDL, LDLDIRECT in the last 72 hours. Thyroid Function Tests No results for input(s): TSH, T4TOTAL, T3FREE, THYROIDAB in the last 72 hours.  Invalid input(s): FREET3  Other results:   Imaging    No results found.   Medications:     Scheduled Medications: . aspirin  81 mg Per Tube Daily  . atorvastatin  80 mg Per Tube q1800  . B-complex with vitamin C  1 tablet Oral Daily  . bisacodyl  10 mg Oral Daily   Or  . bisacodyl  10 mg Rectal Daily  . chlorhexidine gluconate (MEDLINE KIT)  15 mL Mouth Rinse BID  . Chlorhexidine Gluconate Cloth  6 each Topical Daily  . docusate  200 mg Per Tube Daily  . feeding supplement (PRO-STAT SUGAR FREE 64)  60 mL Per Tube TID  . Gerhardt's butt cream   Topical BID  . insulin aspart  0-24 Units Subcutaneous Q4H  . insulin aspart  3 Units Subcutaneous Q4H  . insulin glargine  24 Units Subcutaneous Daily  . mouth rinse  15 mL Mouth Rinse 10 times per day  . pantoprazole (PROTONIX) IV  40 mg Intravenous Q24H  . sodium chloride flush  10-40 mL Intracatheter Q12H  . tamsulosin  0.4 mg Oral Daily    Infusions: .  prismasol BGK 4/2.5 400 mL/hr at 04/03/19 0518  .  prismasol BGK 4/2.5 200 mL/hr at 04/03/19 0000  . sodium chloride    . sodium chloride Stopped (04/03/19 0630)  . sodium chloride 10 mL/hr at 03/29/19 2128  . amiodarone 60 mg/hr (04/03/19 0800)  . cefTAZidime (FORTAZ)  IV Stopped (04/02/19 2210)  . dexmedetomidine (PRECEDEX) IV infusion    . epinephrine Stopped (03/31/19 1321)  . feeding supplement (VITAL 1.5 CAL) 1,000 mL (04/02/19 0813)  . impella catheter heparin 25 unit/mL in dextrose 5%    . heparin 650 Units/hr (04/03/19 0800)  . norepinephrine (LEVOPHED) Adult infusion 36 mcg/min (04/03/19 0800)  .  prismasol BGK 4/2.5 1,500 mL/hr at 04/03/19 0126  . vasopressin (PITRESSIN) infusion - *FOR SHOCK* 0.05 Units/min (04/03/19 0800)    PRN Medications: sodium chloride, oxyCODONE **AND** acetaminophen, dextrose, docusate, fentaNYL (SUBLIMAZE) injection, heparin, heparin, midazolam, ondansetron (ZOFRAN) IV, sodium chloride flush, sodium chloride flush    Assessment/Plan    1. Acute systolic HF -> Cardiogenic shock - post-op echo on 03/20/19 EF 25-30% (pre-op 30-35%. In 12/2017 EF normal) - Remains on Impella support P-6 Waveforms ok LDH  - CO-OX 69% . On VP 0.05  and NE 36 mcg. CVP 10 - Likely vasodilated with CVVHD and Impella. - Continue CVVHD. Support BP to facilitate volume removal at at least -200/hr . Weight down another 6 pounds.  - He remains critically ill. Hemodynamically improved with full support with impella and dual pressors. - Volume status looks better.   2. CAD s/p CABG this admit (LIMA->LAD, SVG->OM, SVG->RCA on 03/15/19) - No current s/s angina - Continue ASA/statin  3. Acute hypoxic respiratory failure with Pseudomonas PNA - s/p trach on 03/27/19 - Continue Fortaz - CCM managing vent   4. PAF - s/p MAZE and LAA occlusion - In/out of AF. In SR   continue IV amio - Off heparin with melena + hematuria - On systemic heparin.    5. AKI  - due to shock/ATN -Creatinine peaked at 5.04 - Started [facility] 03/29/19 per Nephrology.   6. Hematuria -LDH ok. Off heparin drip.  - Bladder irrigation every 4 hours.  - Urology following. Still with some blood urine output.   7. Melena 2 black BMs this morning.    8. Anemia -Hematuria/melena -Hgb 7.6 -->10.2 ->8.9  -Received 2UPRBCs  Ongoing goals of care discussions.   Length of Stay: [place], NP  04/03/2019, 8:30 AM  Advanced Heart Failure Team Pager [phone] (M-F; [place])  Please contact [facility] for night-coverage after hours (4p -7a ) and weekends on [email or web address]  Agree with above.  He remains  critically ill with MSOF. Remains on Impella 5.5 and CVVHD.Marland KitchenDoing well with volume removal but now with increasing pressor requirements. Still with some dark stools. Heparin only in purge.  Remains on [place] for PNA. Denies CP or SOB.   General:  Elderly/ Weak appearing. No resp difficulty HEENT: normal Neck: supple. + trach. +central line. Carotids 2+ bilat; no bruits. No lymphadenopathy or thryomegaly appreciated. Cor: PMI nondisplaced. Regular rate & rhythm. No rubs, gallops or murmurs. Lungs: crackles Abdomen: soft, nontender, nondistended. No hepatosplenomegaly. No bruits or masses. Good bowel sounds. Extremities: no cyanosis, clubbing, rash, 2+ edema Neuro: alert. Follows commands , cranial nerves grossly intact. moves all 4 extremities w/o difficulty. Affect pleasant   He remains critically ill with MSOF. I again discussed with him his [place] and wants to continue with aggressive care over the next few  days. Will continue to pull fluid with CVVHD at 150/hr. Continue pressor support. Impella flows and waveforms are ok.   Will plan for further fluid removal and then try to wean pressors as tolerated. Continue to hold heparin. Treat with broad spectrum abx.   CRITICAL CARE Performed by: Glori Bickers  Total critical care time: 35 minutes  Critical care time was exclusive of separately billable procedures and treating other patients.  Critical care was necessary to treat or prevent imminent or life-threatening deterioration.  Critical care was time spent personally by me (independent of midlevel providers or residents) on the following activities: development of treatment plan with patient and/or surrogate as well as nursing, discussions with consultants, evaluation of patient's response to treatment, examination of patient, obtaining history from patient or surrogate, ordering and performing treatments and interventions, ordering and review of laboratory studies, ordering and review of  radiographic studies, pulse oximetry and re-evaluation of patient's condition.   Glori Bickers, MD  2:10 PM

## 2019-04-03 NOTE — Progress Notes (Signed)
PT Cancellation Note  Patient Details Name: Jesse Murphy MRN: [id number] DOB: 07/15/1928   Cancelled Treatment:    Reason Eval/Treat Not Completed: Fatigue/lethargy limiting ability to participate Pt declines therapy today. Pt shakes head when asked if PT should return tomorrow. Pt tired and worn out. Will check on pt end of week to see if he is willing to participate in therapy or if there are any changes to the POC.  Marguarite Arbour A Joeangel Jeanpaul 04/03/2019, 1:56 PM Marisa Severin, PT, DPT Acute Rehabilitation Services Pager [phone] Office [phone]

## 2019-04-03 NOTE — Progress Notes (Signed)
Patient ID: THAILAN SAVA, male   DOB: [birth year], 86 y.o.   MRN: [id number] S: Had difficulty with UF yesterday due to hypotension. O:BP (!) 128/57   Pulse 82   Temp 98 F (36.7 C) (Oral)   Resp 20   Ht '5\' 8"'  (1.727 m)   Wt 95.1 kg   SpO2 97%   BMI 31.88 kg/m   Intake/Output Summary (Last 24 hours) at 04/03/2019 0938 Last data filed at 04/03/2019 0900 Gross per 24 hour  Intake 3922.38 ml  Output 4668 ml  Net -745.62 ml   Intake/Output: I/O last 3 completed shifts: In: 5466.4 [I.V.:2865.6; Other:490.7; NG/GT:1810; IV Piggyback:300.1] Out: 9407 [Urine:160; Emesis/NG output:50; ZMOQH:4765]  Intake/Output this shift:  Total I/O In: 391.8 [I.V.:184.6; Other:27.2; NG/GT:180] Out: 620 [Other:620] Weight change: -2.8 kg Gen: intubated via trach CVS: no rub Resp: cta Abd: +BS, soft, NT/ND Ext: trace edema  Recent Labs  Lab 03/27/19 1600 03/27/19 1621 03/28/19 0417 03/28/19 0432 03/28/19 1532 03/28/19 1539 03/29/19 0417 03/29/19 0425 03/29/19 1541 03/29/19 1552 03/30/19 0255 03/30/19 0546 03/30/19 1510 03/30/19 1510 03/31/19 0408 03/31/19 0408 03/31/19 1510 03/31/19 1510 04/01/19 0400 04/01/19 0455 04/01/19 1844 04/02/19 0349 04/02/19 0450 04/02/19 1612 04/02/19 2331 04/03/19 0501 04/03/19 0548  NA 134*   < > 133*   < > 131*   < > 130*   < > 131*   < > 132*   < > 132*   < > 133*  134*   < > 134*   < > 134*   < > 134* 136 134* 134* 134* 136 133*  K 3.8   < > 3.8   < > 3.7   < > 3.7   < > 3.7   < > 3.6   < > 4.1   < > 4.3  4.4   < > 4.1   < > 4.5   < > 4.4 4.3 4.7 4.6 4.2 4.2 4.2  CL 95*   < > 93*   < > 91*   < > 92*   < > 92*   < > 96*   < > 97*   < > 99   < > 100  --  100  --  102 101  --  99 99  --  97*  CO2 21*   < > 21*   < > 20*   < > 20*   < > 20*   < > 22   < > 23   < > 23  --  22  --  24  --  22 25  --  23  --   --  25  GLUCOSE 164*   < > 124*   < > 180*   < > 196*   < > 175*   < > 163*   < > 151*   < > 182*   < > 194*  --  187*  --  200* 188*  --  205*  166*  --  253*  BUN 166*   < > 168*   < > 177*   < > 191*   < > 172*   < > 130*   < > 97*   < > 70*   < > 58*  --  49*  --  47* 43*  --  44* 40*  --  40*  CREATININE 4.15*   < > 4.43*   < > 4.61*   < > 5.04*   < >  4.43*   < > 3.47*   < > 2.73*   < > 2.32*   < > 2.03*  --  1.89*  --  1.79* 1.83*  --  1.75* 1.70*  --  1.78*  ALBUMIN 2.6*   < > 2.4*   < > 2.2*   < > 2.1*   < > 2.2*   < > 2.0*   < > 2.0*   < > 2.0*  --  2.0*  --  2.0*  --  2.0* 2.0*  --  2.0*  --   --  1.9*  CALCIUM 7.4*   < > 7.6*   < > 7.5*   < > 7.5*   < > 7.5*   < > 7.5*   < > 7.4*   < > 7.8*  --  7.7*  --  7.9*  --  7.6* 7.9*  --  7.5*  --   --  7.6*  PHOS 7.9*  --   --   --  8.1*  --   --   --  8.5*  --   --   --  4.6  --   --   --  3.3  --   --   --  2.3*  --   --  1.9*  --   --   --   AST  --    < > 73*  --   --   --  52*  --   --   --  47*  --   --   --  47*  --   --   --  35  --   --  35  --   --   --   --  33  ALT  --    < > 63*  --   --   --  32  --   --   --  24  --   --   --  24  --   --   --  20  --   --  20  --   --   --   --  24   < > = values in this interval not displayed.   Liver Function Tests: Recent Labs  Lab 04/01/19 0400 04/01/19 1844 04/02/19 0349 04/02/19 1612 04/03/19 0548  AST 35  --  35  --  33  ALT 20  --  20  --  24  ALKPHOS 66  --  74  --  76  BILITOT 0.6  --  0.9  --  0.9  PROT 5.5*  --  5.5*  --  5.3*  ALBUMIN 2.0*   < > 2.0* 2.0* 1.9*   < > = values in this interval not displayed.   No results for input(s): LIPASE, AMYLASE in the last 168 hours. No results for input(s): AMMONIA in the last 168 hours. CBC: Recent Labs  Lab 04/01/19 2004 04/01/19 2004 04/02/19 0349 04/02/19 0450 04/02/19 1242 04/02/19 1242 04/02/19 1612 04/02/19 1612 04/02/19 2331 04/03/19 0406 04/03/19 0501  WBC 24.9*   < > 30.6*   < > 35.0*  --  33.3*  --   --  32.3*  --   HGB 10.2*   < > 10.0*   < > 9.9*   < > 9.4*   < > 9.9* 8.9* 8.5*  HCT 31.5*   < > 30.4*   < > 31.4*   < > 29.4*   < >  29.0* 28.2* 25.0*   MCV 89.5  --  90.5  --  93.2  --  90.7  --   --  92.2  --   PLT 169   < > 158   < > 146*  --  135*  --   --  134*  --    < > = values in this interval not displayed.   Cardiac Enzymes: No results for input(s): CKTOTAL, CKMB, CKMBINDEX, TROPONINI in the last 168 hours. CBG: Recent Labs  Lab 04/02/19 1616 04/02/19 2033 04/02/19 2326 04/03/19 0416 04/03/19 0745  GLUCAP 206* 199* 154* 183* 166*    Iron Studies: No results for input(s): IRON, TIBC, TRANSFERRIN, FERRITIN in the last 72 hours. Studies/Results: DG CHEST PORT 1 VIEW  Result Date: 04/03/2019 CLINICAL DATA:  Check Impella device EXAM: PORTABLE CHEST 1 VIEW COMPARISON:  04/02/2019 FINDINGS: Cardiac shadow is stable. Postsurgical changes are again seen. Right jugular sheath, right-sided PICC line and left subclavian temporary dialysis catheter are again noted and stable. Tracheostomy tube, nasogastric catheter and feeding catheter are noted as well. Impella device is noted in stable position. Changes of mild edema and small effusions are again identified. No new focal abnormality is seen. IMPRESSION: Stable appearance of the chest when compared with the prior exam. Electronically Signed   By: Inez Catalina M.D.   On: 04/03/2019 08:36   DG CHEST PORT 1 VIEW  Result Date: 04/02/2019 CLINICAL DATA:  History of CABG. EXAM: PORTABLE CHEST 1 VIEW COMPARISON:  Chest radiograph 04/01/2019 FINDINGS: Tracheostomy tube terminates at the level of the clavicular heads. Unchanged position of a right IJ sheath, left subclavian catheter, right PICC and right subclavian approach Impella device. An enteric tube passes below the level of left hemidiaphragm with tip excluded from the field of view. Sequela of prior CABG and left atrial appendage clipping. The cardiomediastinal silhouette is unchanged. Unchanged layering bilateral pleural effusions. Redemonstrated atelectasis and/or consolidation within the left lung base. No evidence of pneumothorax. No  acute bony abnormality. IMPRESSION: Support apparatus as described. Layering bilateral pleural effusions with left basilar atelectasis and/or pneumonia, similar to prior examination. Electronically Signed   By: Kellie Simmering DO   On: 04/02/2019 07:59   . aspirin  81 mg Per Tube Daily  . atorvastatin  80 mg Per Tube q1800  . B-complex with vitamin C  1 tablet Oral Daily  . bisacodyl  10 mg Oral Daily   Or  . bisacodyl  10 mg Rectal Daily  . chlorhexidine gluconate (MEDLINE KIT)  15 mL Mouth Rinse BID  . Chlorhexidine Gluconate Cloth  6 each Topical Daily  . docusate  200 mg Per Tube Daily  . feeding supplement (PRO-STAT SUGAR FREE 64)  60 mL Per Tube TID  . Gerhardt's butt cream   Topical BID  . insulin aspart  0-24 Units Subcutaneous Q4H  . insulin aspart  3 Units Subcutaneous Q4H  . insulin glargine  24 Units Subcutaneous Daily  . mouth rinse  15 mL Mouth Rinse 10 times per day  . pantoprazole (PROTONIX) IV  40 mg Intravenous Q24H  . sodium chloride flush  10-40 mL Intracatheter Q12H  . tamsulosin  0.4 mg Oral Daily    BMET    Component Value Date/Time   NA 133 (L) 04/03/2019 0548   K 4.2 04/03/2019 0548   CL 97 (L) 04/03/2019 0548   CO2 25 04/03/2019 0548   GLUCOSE 253 (H) 04/03/2019 0548   BUN 40 (H) 04/03/2019 6767  CREATININE 1.78 (H) 04/03/2019 0548   CALCIUM 7.6 (L) 04/03/2019 0548   GFRNONAA 33 (L) 04/03/2019 0548   GFRAA 39 (L) 04/03/2019 0548   CBC    Component Value Date/Time   WBC 32.3 (H) 04/03/2019 0406   RBC 3.06 (L) 04/03/2019 0406   HGB 8.5 (L) 04/03/2019 0501   HCT 25.0 (L) 04/03/2019 0501   PLT 134 (L) 04/03/2019 0406   MCV 92.2 04/03/2019 0406   MCH 29.1 04/03/2019 0406   MCHC 31.6 04/03/2019 0406   RDW 16.2 (H) 04/03/2019 0406   LYMPHSABS 0.6 (L) 03/13/2019 1056   MONOABS 0.5 03/13/2019 1056   EOSABS 0.1 03/13/2019 1056   BASOSABS 0.0 03/13/2019 1056    Assessment/Plan:  1. AKI (Cr was 0.7 prior to surgery)- due to postoperative  CHF/cardiogenic shock that was refractory to diuretics and started on CRRT on 03/29/19 for volume removal.  Tolerating CRRT with volume removal of 200 ml/hr.  UF limited by low BP but is down 9 kg since starting CRRT. 1. Will decrease UF rate to 0-100 ml/hr given hypotension and improved volume status.    2. Acute systolic CHF/cardiogenic shock- EF 25-30% currently on Impella.  Continue with CVVHD and UF as tolerated. 3. CAD s/p CABG on 5/91/63 complicated by cardiogenic shock and ARF. 4. Acute hypoxic respiratory failure with pseudomonas pneumonia- s/p trach on 03/27/19.  Cont with fortaz per PCCM 5. PAF- s/p MAZE and LAA occlusion- on amio per CHF team 6. Hematuria- bladder irrigation 7. ABLA-  Has Melena.  Transfuse as needed.  Received 2 units PRBC's overnight with appropriate increase in Hgb from 7.6 to 10.2.  Continue to follow H/H and transfuse prn.  8. Protein malnutrition- moderate to severe 9. Vascular access- left [place] HD catheter placed 03/29/19 10. Disposition- poor overall prognosis.  He is not a longterm dialysis candidate given his cardiogenic shock and trach.  Recommend palliative care consult to help set goals/limits of care.  Donetta Potts, MD [employer] [phone]

## 2019-04-03 NOTE — Progress Notes (Signed)
[facility] DAILY ICU PROGRESS NOTE                   [address]            [place] [address]          [phone]   7 Days Post-Op Procedure(s) (LRB): TRACHEOSTOMY placed using Shiley 8DCT Cuffed. (N/A) Placement Of Impella Left Ventricular Assist Device using ABIOMED Impella 5.5 with SmartAssist Device. (Right)  Total Length of Stay:  LOS: 20 days   Subjective: Remains critically ill  Objective: Vital signs in last 24 hours: Temp:  [98 F (36.7 C)-99.1 F (37.3 C)] 98.7 F (37.1 C) (02/16 1143) Pulse Rate:  [29-124] 89 (02/16 1110) Cardiac Rhythm: Atrial flutter (02/16 0730) Resp:  [15-38] 25 (02/16 1110) BP: (92-134)/(53-61) 134/57 (02/16 1110) SpO2:  [88 %-100 %] 95 % (02/16 1111) Arterial Line BP: (97-130)/(52-69) 113/55 (02/16 1100) FiO2 (%):  [40 %] 40 % (02/16 1111) Weight:  [95.1 kg] 95.1 kg (02/16 0500)  Filed Weights   04/01/19 0500 04/02/19 0500 04/03/19 0500  Weight: 101.9 kg 97.9 kg 95.1 kg    Weight change: -2.8 kg   Hemodynamic parameters for last 24 hours: CVP:  [7 mmHg-14 mmHg] 10 mmHg CO:  [6.4 L/min] 6.4 L/min CI:  [2.9 L/min/m2] 2.9 L/min/m2  Intake/Output from previous day: 02/15 0701 - 02/16 0700 In: 3857.2 [I.V.:2101; NG/GT:1230; IV Piggyback:200.1] Out: 5256 [Urine:160; Emesis/NG output:50]  Intake/Output this shift: Total I/O In: 827.3 [I.V.:446.7; Other:68; NG/GT:260; IV Piggyback:52.6] Out: 1356 [Other:1356]  Current Meds: Scheduled Meds: . aspirin  81 mg Per Tube Daily  . atorvastatin  80 mg Per Tube q1800  . B-complex with vitamin C  1 tablet Oral Daily  . bisacodyl  10 mg Oral Daily   Or  . bisacodyl  10 mg Rectal Daily  . chlorhexidine gluconate (MEDLINE KIT)  15 mL Mouth Rinse BID  . Chlorhexidine Gluconate Cloth  6 each Topical Daily  . docusate  200 mg Per Tube Daily  . feeding supplement (PRO-STAT SUGAR FREE 64)  60 mL Per Tube TID  . Gerhardt's butt cream   Topical BID  . insulin aspart  0-24  Units Subcutaneous Q4H  . insulin aspart  5 Units Subcutaneous Q4H  . [START ON 04/04/2019] insulin glargine  26 Units Subcutaneous Daily  . mouth rinse  15 mL Mouth Rinse 10 times per day  . pantoprazole (PROTONIX) IV  40 mg Intravenous Q24H  . sodium chloride flush  10-40 mL Intracatheter Q12H  . tamsulosin  0.4 mg Oral Daily   Continuous Infusions: .  prismasol BGK 4/2.5 400 mL/hr at 04/03/19 0518  .  prismasol BGK 4/2.5 200 mL/hr at 04/03/19 0000  . sodium chloride    . sodium chloride Stopped (04/03/19 1037)  . sodium chloride 10 mL/hr at 03/29/19 2128  . amiodarone 60 mg/hr (04/03/19 1224)  . cefTAZidime (FORTAZ)  IV 2 g (04/03/19 1202)  . dexmedetomidine (PRECEDEX) IV infusion    . epinephrine Stopped (03/31/19 1321)  . feeding supplement (VITAL 1.5 CAL) 1,000 mL (04/02/19 0813)  . impella catheter heparin 25 unit/mL in dextrose 5%    . heparin 650 Units/hr (04/03/19 1200)  . norepinephrine (LEVOPHED) Adult infusion 34 mcg/min (04/03/19 1200)  . prismasol BGK 4/2.5 1,500 mL/hr at 04/03/19 0126  . [START ON 04/04/2019] vancomycin    . vancomycin Stopped (04/03/19 1053)  . vasopressin (PITRESSIN) infusion - *FOR SHOCK* 0.04 Units/min (04/03/19 1200)  PRN Meds:.sodium chloride, oxyCODONE **AND** acetaminophen, dextrose, docusate, fentaNYL (SUBLIMAZE) injection, heparin, heparin, midazolam, ondansetron (ZOFRAN) IV, sodium chloride flush, sodium chloride flush  General appearance: alert and cooperative Heart: regular rate and rhythm Lungs: dim in bases Abdomen: soft, nontender Extremities: + edema Wound: incis healing well  Lab Results: CBC: Recent Labs    04/02/19 1612 04/02/19 2331 04/03/19 0406 04/03/19 0501  WBC 33.3*  --  32.3*  --   HGB 9.4*   < > 8.9* 8.5*  HCT 29.4*   < > 28.2* 25.0*  PLT 135*  --  134*  --    < > = values in this interval not displayed.   BMET:  Recent Labs    04/02/19 1612 04/02/19 1612 04/02/19 2331 04/02/19 2331 04/03/19 0501  04/03/19 0548  NA 134*   < > 134*   < > 136 133*  K 4.6   < > 4.2   < > 4.2 4.2  CL 99   < > 99  --   --  97*  CO2 23  --   --   --   --  25  GLUCOSE 205*   < > 166*  --   --  253*  BUN 44*   < > 40*  --   --  40*  CREATININE 1.75*   < > 1.70*  --   --  1.78*  CALCIUM 7.5*  --   --   --   --  7.6*   < > = values in this interval not displayed.    CMET: Lab Results  Component Value Date   WBC 32.3 (H) 04/03/2019   HGB 8.5 (L) 04/03/2019   HCT 25.0 (L) 04/03/2019   PLT 134 (L) 04/03/2019   GLUCOSE 253 (H) 04/03/2019   CHOL 117 03/16/2019   TRIG 52 03/16/2019   HDL 33 (L) 03/16/2019   LDLCALC 74 03/16/2019   ALT 24 04/03/2019   AST 33 04/03/2019   NA 133 (L) 04/03/2019   K 4.2 04/03/2019   CL 97 (L) 04/03/2019   CREATININE 1.78 (H) 04/03/2019   BUN 40 (H) 04/03/2019   CO2 25 04/03/2019   TSH 3.683 03/13/2019   INR 1.7 (H) 03/27/2019   HGBA1C 6.1 (H) 03/13/2019      PT/INR: No results for input(s): LABPROT, INR in the last 72 hours. Radiology: DG CHEST PORT 1 VIEW  Result Date: 04/03/2019 CLINICAL DATA:  Check Impella device EXAM: PORTABLE CHEST 1 VIEW COMPARISON:  04/02/2019 FINDINGS: Cardiac shadow is stable. Postsurgical changes are again seen. Right jugular sheath, right-sided PICC line and left subclavian temporary dialysis catheter are again noted and stable. Tracheostomy tube, nasogastric catheter and feeding catheter are noted as well. Impella device is noted in stable position. Changes of mild edema and small effusions are again identified. No new focal abnormality is seen. IMPRESSION: Stable appearance of the chest when compared with the prior exam. Electronically Signed   By: Inez Catalina M.D.   On: 04/03/2019 08:36     Assessment/Plan: S/P Procedure(s) (LRB): TRACHEOSTOMY placed using Shiley 8DCT Cuffed. (N/A) Placement Of Impella Left Ventricular Assist Device using ABIOMED Impella 5.5 with SmartAssist Device. (Right)  1 remains critical on vent , Impella,  multiple pressors/inotropes , CRRT 2 in sinus rhythm/AFib on amio 3 + transfusion for GIB and hematuria, getting irrigation of foley and Urology is managing, may also have urethral stricture.H/H 8.5/25 BUN/Creat pretty stable at 40/1.78 4 BS fair control 6 cont to maximize  therapies for now  7 d/c cordis- not using, rising WBC(32K) and using other lines Jesse Murphy 04/03/2019 1:12 PM

## 2019-04-03 NOTE — Progress Notes (Signed)
[place] for Anticoagulation management/assistance  Indication: Impella 5.5  No Known Allergies  Patient Measurements: Height: 5\' 8"  (172.7 cm) Weight: 209 lb 10.5 oz (95.1 kg) IBW/kg (Calculated) : 68.4 Heparin Dosing Weight: 86kg  Vital Signs: Temp: 98.7 F (37.1 C) (02/16 1143) Temp Source: Oral (02/16 1143) BP: 115/51 (02/16 1517) Pulse Rate: 89 (02/16 1700)  Labs: Recent Labs    04/02/19 0349 04/02/19 0450 04/02/19 1348 04/02/19 1612 04/02/19 1612 04/02/19 2300 04/02/19 2331 04/02/19 2331 04/03/19 0406 04/03/19 0406 04/03/19 0501 04/03/19 0548 04/03/19 0733 04/03/19 1600 04/03/19 1618  HGB 10.0*   < >  --  9.4*   < >  --  9.9*   < > 8.9*   < > 8.5*  --   --  8.4*  --   HCT 30.4*   < >  --  29.4*   < >  --  29.0*   < > 28.2*  --  25.0*  --   --  26.9*  --   PLT 158   < >  --  135*  --   --   --   --  134*  --   --   --   --  127*  --   APTT 156*  --   --   --   --   --   --   --  90*  --   --   --   --   --   --   HEPARINUNFRC 0.35   < >   < >  --   --  0.11*  --   --   --   --   --   --  0.18*  --  0.22*  CREATININE 1.83*   < >  --  1.75*   < >  --  1.70*  --   --   --   --  1.78*  --  1.86*  --    < > = values in this interval not displayed.    Estimated Creatinine Clearance: 30.7 mL/min (A) (by C-G formula based on SCr of 1.86 mg/dL (H)).  Assessment: 86 year old male s/p CABG 1/29 with MAZE and LAA. Patient taken to OR 2/9 for tracheostomy and impella 5.5 placement.   Purge is heparin half/strength (25 units/ml) infusing ~13.5 ml/h (~340 units/h). Restarted on systemic heparin on 2/15 in PM (no heparin in CRRT circuit)- currently running at 650 units/hr. Heparin level this afternoon came back at 0.22 (up from 0.18 earlier today). Hgb trending down to 8.4, plt 127. LDH 265. Continues to have melena stool.   Per discussion with Dr Prescott Gum with pharmacist earlier today - will continue same rate of heparin systemic and  monitor through the day for bleeding.   Goal of Therapy:  Heparin level ~0.3 units/mL per Dr. VT Monitor platelets by anticoagulation protocol: Yes   Plan:   Continue 1/2 strength heparin purge per MD Continue systemic heparin at 650 units/hr for now per earlier pharmacist discussion with MD (max systemic heparin rate 1000 units/hr per Dr. Prescott Gum) Re-check heparin level in AM Monitor CBC, s/sx bleeding   Elicia Lamp, PharmD, BCPS Please check AMION for all [facility] contact numbers Clinical Pharmacist 04/03/2019 6:11 PM

## 2019-04-04 ENCOUNTER — Inpatient Hospital Stay (HOSPITAL_COMMUNITY): Payer: Medicare Other

## 2019-04-04 LAB — CBC
HCT: 21.9 % — ABNORMAL LOW (ref 39.0–52.0)
HCT: 26.7 % — ABNORMAL LOW (ref 39.0–52.0)
Hemoglobin: 6.8 g/dL — CL (ref 13.0–17.0)
Hemoglobin: 8.6 g/dL — ABNORMAL LOW (ref 13.0–17.0)
MCH: 29.4 pg (ref 26.0–34.0)
MCH: 29.7 pg (ref 26.0–34.0)
MCHC: 31.1 g/dL (ref 30.0–36.0)
MCHC: 32.2 g/dL (ref 30.0–36.0)
MCV: 94.8 fL (ref 80.0–100.0)
MCV: 92.1 fL (ref 80.0–100.0)
Platelets: 104 10*3/uL — ABNORMAL LOW (ref 150–400)
Platelets: 80 10*3/uL — ABNORMAL LOW (ref 150–400)
RBC: 2.31 MIL/uL — ABNORMAL LOW (ref 4.22–5.81)
RBC: 2.9 MIL/uL — ABNORMAL LOW (ref 4.22–5.81)
RDW: 16.9 % — ABNORMAL HIGH (ref 11.5–15.5)
RDW: 16.1 % — ABNORMAL HIGH (ref 11.5–15.5)
WBC: 25.8 10*3/uL — ABNORMAL HIGH (ref 4.0–10.5)
WBC: 17.8 10*3/uL — ABNORMAL HIGH (ref 4.0–10.5)
nRBC: 0.5 % — ABNORMAL HIGH (ref 0.0–0.2)
nRBC: 0.5 % — ABNORMAL HIGH (ref 0.0–0.2)

## 2019-04-04 LAB — COMPREHENSIVE METABOLIC PANEL
ALT: 25 U/L (ref 0–44)
AST: 39 U/L (ref 15–41)
Albumin: 2.2 g/dL — ABNORMAL LOW (ref 3.5–5.0)
Alkaline Phosphatase: 73 U/L (ref 38–126)
Anion gap: 13 (ref 5–15)
BUN: 38 mg/dL — ABNORMAL HIGH (ref 8–23)
CO2: 22 mmol/L (ref 22–32)
Calcium: 7.5 mg/dL — ABNORMAL LOW (ref 8.9–10.3)
Chloride: 98 mmol/L (ref 98–111)
Creatinine, Ser: 1.77 mg/dL — ABNORMAL HIGH (ref 0.61–1.24)
GFR calc Af Amer: 39 mL/min — ABNORMAL LOW (ref >60)
GFR calc non Af Amer: 34 mL/min — ABNORMAL LOW (ref >60)
Glucose, Bld: 196 mg/dL — ABNORMAL HIGH (ref 70–99)
Potassium: 4.3 mmol/L (ref 3.5–5.1)
Sodium: 133 mmol/L — ABNORMAL LOW (ref 135–145)
Total Bilirubin: 0.9 mg/dL (ref 0.3–1.2)
Total Protein: 5.5 g/dL — ABNORMAL LOW (ref 6.5–8.1)

## 2019-04-04 LAB — HEPARIN LEVEL (UNFRACTIONATED): Heparin Unfractionated: 0.13 IU/mL — ABNORMAL LOW (ref 0.30–0.70)

## 2019-04-04 LAB — GLUCOSE, CAPILLARY
Glucose-Capillary: 194 mg/dL — ABNORMAL HIGH (ref 70–99)
Glucose-Capillary: 163 mg/dL — ABNORMAL HIGH (ref 70–99)
Glucose-Capillary: 142 mg/dL — ABNORMAL HIGH (ref 70–99)
Glucose-Capillary: 119 mg/dL — ABNORMAL HIGH (ref 70–99)
Glucose-Capillary: 113 mg/dL — ABNORMAL HIGH (ref 70–99)

## 2019-04-04 LAB — POCT I-STAT 7, (LYTES, BLD GAS, ICA,H+H)
Acid-base deficit: 2 mmol/L (ref 0.0–2.0)
Bicarbonate: 22 mmol/L (ref 20.0–28.0)
Calcium, Ion: 1.09 mmol/L — ABNORMAL LOW (ref 1.15–1.40)
HCT: 20 % — ABNORMAL LOW (ref 39.0–52.0)
Hemoglobin: 6.8 g/dL — CL (ref 13.0–17.0)
O2 Saturation: 95 %
Patient temperature: 97.8
Potassium: 4.2 mmol/L (ref 3.5–5.1)
Sodium: 133 mmol/L — ABNORMAL LOW (ref 135–145)
TCO2: 23 mmol/L (ref 22–32)
pCO2 arterial: 31.9 mmHg — ABNORMAL LOW (ref 32.0–48.0)
pH, Arterial: 7.444 (ref 7.350–7.450)
pO2, Arterial: 68 mmHg — ABNORMAL LOW (ref 83.0–108.0)

## 2019-04-04 LAB — LACTATE DEHYDROGENASE: LDH: 228 U/L — ABNORMAL HIGH (ref 98–192)

## 2019-04-04 LAB — RENAL FUNCTION PANEL
Albumin: 2.6 g/dL — ABNORMAL LOW (ref 3.5–5.0)
Anion gap: 10 (ref 5–15)
BUN: 40 mg/dL — ABNORMAL HIGH (ref 8–23)
CO2: 22 mmol/L (ref 22–32)
Calcium: 7.6 mg/dL — ABNORMAL LOW (ref 8.9–10.3)
Chloride: 102 mmol/L (ref 98–111)
Creatinine, Ser: 1.76 mg/dL — ABNORMAL HIGH (ref 0.61–1.24)
GFR calc Af Amer: 39 mL/min — ABNORMAL LOW (ref >60)
GFR calc non Af Amer: 34 mL/min — ABNORMAL LOW (ref >60)
Glucose, Bld: 120 mg/dL — ABNORMAL HIGH (ref 70–99)
Phosphorus: 2.5 mg/dL (ref 2.5–4.6)
Potassium: 4.3 mmol/L (ref 3.5–5.1)
Sodium: 134 mmol/L — ABNORMAL LOW (ref 135–145)

## 2019-04-04 LAB — COOXEMETRY PANEL
Carboxyhemoglobin: 1.8 % — ABNORMAL HIGH (ref 0.5–1.5)
Carboxyhemoglobin: 1.8 % — ABNORMAL HIGH (ref 0.5–1.5)
Methemoglobin: 1.1 % (ref 0.0–1.5)
Methemoglobin: 1.1 % (ref 0.0–1.5)
O2 Saturation: 72.3 %
O2 Saturation: 78.7 %
Total hemoglobin: 7.3 g/dL — ABNORMAL LOW (ref 12.0–16.0)
Total hemoglobin: 9.7 g/dL — ABNORMAL LOW (ref 12.0–16.0)

## 2019-04-04 LAB — MAGNESIUM: Magnesium: 2.5 mg/dL — ABNORMAL HIGH (ref 1.7–2.4)

## 2019-04-04 LAB — PREPARE RBC (CROSSMATCH)

## 2019-04-04 LAB — VANCOMYCIN, RANDOM: Vancomycin Rm: 15

## 2019-04-04 LAB — APTT: aPTT: 99 seconds — ABNORMAL HIGH (ref 24–36)

## 2019-04-04 MED ORDER — VANCOMYCIN HCL IN DEXTROSE 1-5 GM/200ML-% IV SOLN
1000.0000 mg | INTRAVENOUS | Status: DC
  Administered 2019-04-04 – 2019-04-05 (×2): 1000 mg via INTRAVENOUS
  Filled 2019-04-04 (×2): qty 200

## 2019-04-04 MED ORDER — VITAL 1.5 CAL PO LIQD
1000.0000 mL | ORAL | Status: DC
  Administered 2019-04-04 – 2019-04-06 (×2): 1000 mL
  Filled 2019-04-04 (×8): qty 1000

## 2019-04-04 MED ORDER — SODIUM CHLORIDE 0.9% IV SOLUTION: Freq: Once | INTRAVENOUS | Status: AC

## 2019-04-04 MED ORDER — PRO-STAT SUGAR FREE PO LIQD
60.0000 mL | Freq: Two times a day (BID) | ORAL | Status: DC
  Administered 2019-04-04 – 2019-04-12 (×13): 60 mL
  Filled 2019-04-04 (×13): qty 60

## 2019-04-04 NOTE — Progress Notes (Addendum)
Nutrition Follow up   DOCUMENTATION CODES:   Not applicable  INTERVENTION:   Replete Phosphorus   Increase tube feeding:  -Vital 1.5 @ 50 ml/hr via [name] (1200 ml) -Decrease 60 ml Prostat BID -B complex with Vitamin C  Provides: 2200 kcals, 141 grams protein, 917 ml free water.   NUTRITION DIAGNOSIS:   Increased nutrient needs related to post-op healing as evidenced by estimated needs.  Ongoing  GOAL:   Patient will meet greater than or equal to 90% of their needs   Addressed via TF   MONITOR:   Weight trends, Diet advancement, Vent status, Skin, TF tolerance, Labs, I & O's  REASON FOR ASSESSMENT:   Consult Enteral/tube feeding initiation and management  ASSESSMENT:   Patient with PMH significant for CAD s/p PCI, prostate cancer, HTN, CHF, and DM. Presents this admission with chronic a.fib with RVR.   1/28- s/p L heart cath  1/30- s/p CABG x3, MAZE, extubated  2/2- re-intubated  2/5- extubated 2/8- re-intubated  2/10- s/p impella, trach, NGT placed-start trickle feeding 2/11- start CRRT  Pt discussed during ICU rounds and with RN.   Remains on pressors. Continues with CRRT. Unable to UF yesterday due to hypotension. Tolerating trach collar BID (3 hours this am per RN). Tolerating Vital 1.5 @ 40 ml/hr via [name]. OGT placed to suction. Having BMs. Noted new DTI to chest.   Admission weight: 89.3 kg  Current weight: 91.4 kg (down 10 kg in last three days)  Patient on ventilator support via trach MV: 9.7 L/min Temp (24hrs), Avg:98.1 F (36.7 C), Min:97.8 F (36.6 C), Max:98.4 F (36.9 C)  I/O: -5,769 ml since 2/3 UOP: 25 ml x 24 hrs  CRRT: 3,652 ml x 24 hrs   Drips: levophed, vasopressin  Medications: dulcolax, colace, SS novolog, lantus,  Labs: Na 133 (L) Phosphorus 2.2 (L) Mg 2.5 (H) Cr 1.77-slightly decreased   Diet Order:   Diet Order    None      EDUCATION NEEDS:   Not appropriate for education at this time  Skin:  Skin  Assessment: Skin Integrity Issues: Skin Integrity Issues:: Stage I, DTI DTI: chest Stage I: vertebral column Incisions: chest, leg  Last BM:  2/16  Height:   Ht Readings from Last 1 Encounters:  03/26/19 5\' 8"  (1.727 m)    Weight:   Wt Readings from Last 1 Encounters:  04/04/19 91.4 kg    Ideal Body Weight:  70 kg  BMI:  Body mass index is 30.64 kg/m.  Estimated Nutritional Needs:   Kcal:  2200-2400 kcal  Protein:  135-155 grams  Fluid:  >/= 2.2 L/day   Mariana Single RD, LDN Clinical Nutrition Pager # - [phone]

## 2019-04-04 NOTE — Progress Notes (Addendum)
f    Advanced Heart Failure Rounding Note  PCP-Cardiologist: Kate Sable, MD   Subjective:    Remains on impella 5.5 support, P-6, Flow 3.9 CPO 1.2    Started CVVHD 2/11. UF remains at -150 but has been limited by low BP. Weight down another 8  pounds.   On vent through trach. Respiratory culture with pseudomonas on Fortaz  In/out AF on Amio 60 mg per hour. In SR this morning.     Remains on vasopressin 0.05 units + norepi 40 mcg.   Denies pain. Had 3 bloody BMs yesterday.     Flo-trak: CO 6.8 CI 3.2  SVR 698     Objective:   Weight Range: 91.4 kg Body mass index is 30.64 kg/m.   Vital Signs:   Temp:  [97.8 F (36.6 C)-98.7 F (37.1 C)] 97.8 F (36.6 C) (02/17 0651) Pulse Rate:  [73-135] 79 (02/17 0700) Resp:  [15-38] 25 (02/17 0700) BP: (77-134)/(51-61) 116/54 (02/17 0330) SpO2:  [76 %-100 %] 96 % (02/17 0700) Arterial Line BP: (73-155)/(40-82) 133/51 (02/17 0700) FiO2 (%):  [40 %] 40 % (02/17 0651) Weight:  [91.4 kg] 91.4 kg (02/17 0500) Last BM Date: 04/03/19  Weight change: Filed Weights   04/02/19 0500 04/03/19 0500 04/04/19 0500  Weight: 97.9 kg 95.1 kg 91.4 kg    Intake/Output:   Intake/Output Summary (Last 24 hours) at 04/04/2019 0707 Last data filed at 04/04/2019 0700 Gross per 24 hour  Intake 4798.58 ml  Output 3677 ml  Net 1121.58 ml      Physical Exam  CVP 7.  General:  On vent, awake HEENT: normal Neck: trach , supple. no JVD. Carotids 2+ bilat; no bruits. No lymphadenopathy or thryomegaly appreciated. Cor: PMI nondisplaced. Regular rate & rhythm. No rubs, gallops or murmurs.R axillary Impella L subclavian HD cath Lungs: Coarse throughout.  Abdomen: soft, nontender, nondistended. No hepatosplenomegaly. No bruits or masses. Good bowel sounds. Extremities: no cyanosis, clubbing, rash, R and LLE heel boot Neuro: Awake follows commands. MAE x3.  GU: foley hematuria   Telemetry   Overnight in A fib RVR. Back in NSR this morning  with rate 70s   Labs    CBC Recent Labs    04/03/19 1600 04/03/19 1600 04/04/19 0355 04/04/19 0418  WBC 36.1*  --  25.8*  --   HGB 8.4*   < > 6.8* 6.8*  HCT 26.9*   < > 21.9* 20.0*  MCV 94.1  --  94.8  --   PLT 127*  --  104*  --    < > = values in this interval not displayed.   Basic Metabolic Panel Recent Labs    04/02/19 1612 04/02/19 2331 04/03/19 0406 04/03/19 0501 04/03/19 1600 04/03/19 1600 04/04/19 0355 04/04/19 0418  NA 134*   < >  --    < > 133*   < > 133* 133*  K 4.6   < >  --    < > 4.6   < > 4.3 4.2  CL 99   < >  --    < > 100  --  98  --   CO2 23  --   --    < > 22  --  22  --   GLUCOSE 205*   < >  --    < > 201*  --  196*  --   BUN 44*   < >  --    < > 43*  --  38*  --   CREATININE 1.75*   < >  --    < > 1.86*  --  1.77*  --   CALCIUM 7.5*  --   --    < > 7.6*  --  7.5*  --   MG  --   --  2.5*  --   --   --  2.5*  --   PHOS 1.9*  --   --   --  2.2*  --   --   --    < > = values in this interval not displayed.   Liver Function Tests Recent Labs    04/03/19 0548 04/03/19 0548 04/03/19 1600 04/04/19 0355  AST 33  --   --  39  ALT 24  --   --  25  ALKPHOS 76  --   --  73  BILITOT 0.9  --   --  0.9  PROT 5.3*  --   --  5.5*  ALBUMIN 1.9*   < > 1.8* 2.2*   < > = values in this interval not displayed.   No results for input(s): LIPASE, AMYLASE in the last 72 hours. Cardiac Enzymes No results for input(s): CKTOTAL, CKMB, CKMBINDEX, TROPONINI in the last 72 hours.  BNP: BNP (last 3 results) Recent Labs    03/22/19 0401 03/26/19 0626 03/27/19 1255  BNP 340.4* 687.5* 800.4*    ProBNP (last 3 results) No results for input(s): PROBNP in the last 8760 hours.   D-Dimer No results for input(s): DDIMER in the last 72 hours. Hemoglobin A1C No results for input(s): HGBA1C in the last 72 hours. Fasting Lipid Panel No results for input(s): CHOL, HDL, LDLCALC, TRIG, CHOLHDL, LDLDIRECT in the last 72 hours. Thyroid Function Tests No results for  input(s): TSH, T4TOTAL, T3FREE, THYROIDAB in the last 72 hours.  Invalid input(s): FREET3  Other results:   Imaging    No results found.   Medications:     Scheduled Medications: . sodium chloride   Intravenous Once  . aspirin  81 mg Per Tube Daily  . atorvastatin  80 mg Per Tube q1800  . B-complex with vitamin C  1 tablet Oral Daily  . bisacodyl  10 mg Oral Daily   Or  . bisacodyl  10 mg Rectal Daily  . chlorhexidine gluconate (MEDLINE KIT)  15 mL Mouth Rinse BID  . Chlorhexidine Gluconate Cloth  6 each Topical Daily  . docusate  200 mg Per Tube Daily  . feeding supplement (PRO-STAT SUGAR FREE 64)  60 mL Per Tube TID  . Gerhardt's butt cream   Topical BID  . insulin aspart  0-24 Units Subcutaneous Q4H  . insulin aspart  5 Units Subcutaneous Q4H  . insulin glargine  26 Units Subcutaneous Daily  . mouth rinse  15 mL Mouth Rinse 10 times per day  . midodrine  10 mg Oral TID WC  . pantoprazole (PROTONIX) IV  40 mg Intravenous Q24H  . sodium chloride flush  10-40 mL Intracatheter Q12H  . tamsulosin  0.4 mg Oral Daily    Infusions: .  prismasol BGK 4/2.5 400 mL/hr at 04/03/19 0518  .  prismasol BGK 4/2.5 200 mL/hr at 04/04/19 0116  . sodium chloride    . sodium chloride 20 mL/hr at 04/04/19 0700  . sodium chloride 10 mL/hr at 03/29/19 2128  . albumin human Stopped (04/04/19 0517)  . amiodarone 60 mg/hr (04/04/19 0700)  . feeding supplement (VITAL 1.5 CAL) 1,000 mL (  04/02/19 0813)  . impella catheter heparin 25 unit/mL in dextrose 5%    . heparin 650 Units/hr (04/04/19 0700)  . norepinephrine (LEVOPHED) Adult infusion 40 mcg/min (04/04/19 0700)  . prismasol BGK 4/2.5 1,500 mL/hr at 04/04/19 0447  . vancomycin Stopped (04/03/19 2059)  . vasopressin (PITRESSIN) infusion - *FOR SHOCK* 0.05 Units/min (04/04/19 0700)    PRN Medications: sodium chloride, oxyCODONE **AND** acetaminophen, dextrose, docusate, fentaNYL (SUBLIMAZE) injection, heparin, heparin, midazolam,  ondansetron (ZOFRAN) IV, sodium chloride flush, sodium chloride flush    Assessment/Plan    1. Acute systolic HF -> Cardiogenic shock - post-op echo on 03/20/19 EF 25-30% (pre-op 30-35%. In 12/2017 EF normal) - Remains on Impella support P-6 Waveforms ok LDH  - CO-OX 72%  . On VP 0.05  and NE 40 mcg. CVP 7 - Likely vasodilated with CVVHD and Impella. - Continue CVVHD. Weigh down another 8 pounds.  - He remains critically ill. Hemodynamically improved with full support with impella and dual pressors. -   2. CAD s/p CABG this admit (LIMA->LAD, SVG->OM, SVG->RCA on 03/15/19) - No current s/s angina - Continue ASA/statin  3. Acute hypoxic respiratory failure with Pseudomonas PNA - s/p trach on 03/27/19 - WBC 36>26 - Continue Fortaz - CCM managing vent   4. PAF - s/p MAZE and LAA occlusion - In/out of AF. In SR this morning.   -continue IV amio - Off heparin with melena + hematuria - On systemic heparin.    5. AKI  - due to shock/ATN -Creatinine peaked at 5.04 - Started [facility] 03/29/19 per Nephrology.   6. Hematuria -LDH ok. Off heparin drip.  - Bladder irrigation every 4 hours.  - Urology following. Still with some blood urine output.   7. Melena On going melena.    8. Anemia -Hematuria/melena -Hgb 7.6 -->10.2 ->8.9 >6.8  - Receiving blood today     Length of Stay: [place], NP  04/04/2019, 7:07 AM  Advanced Heart Failure Team Pager [phone] (M-F; 7a - 4p)  Please contact [facility] for night-coverage after hours (4p -7a ) and weekends on [email or web address]  Agree with above.   Remains critically ill. On CVVHD pulling -50. Remains on NE and VP. Very weak but alert. On vent through trach. Still having melena. 3-way foley in place for hematuria  General:  Elderly Weak appearing. No resp difficulty HEENT: normal Neck: supple. RIJ cath cvp 7-8 + trach  Carotids 2+ bilat; no bruits. No lymphadenopathy or thryomegaly appreciated. Cor: Impella R axillary site ok   PMI nondisplaced. Regular rate & rhythm. No rubs, gallops or murmurs. Lungs: coarse Abdomen: soft, nontender, nondistended. No hepatosplenomegaly. No bruits or masses. Good bowel sounds. Extremities: no cyanosis, clubbing, rash, 1-2+ edema  Neuro: alert, follows commands cranial nerves grossly intact. moves all 4 extremities w/o difficulty. Affect pleasant  He remains critically ill. Volume status proving with Impella and CVVHD. Once euvolemic will then begin to try and wean inotorpes and impella.  Off heparin with melena. Probably too sick for endoscopy currently. Impella waveforms reviewed personally.   CRITICAL CARE Performed by: Glori Bickers  Total critical care time: 35 minutes  Critical care time was exclusive of separately billable procedures and treating other patients.  Critical care was necessary to treat or prevent imminent or life-threatening deterioration.  Critical care was time spent personally by me (independent of midlevel providers or residents) on the following activities: development of treatment plan with patient and/or surrogate as well as nursing, discussions with consultants,  evaluation of patient's response to treatment, examination of patient, obtaining history from patient or surrogate, ordering and performing treatments and interventions, ordering and review of laboratory studies, ordering and review of radiographic studies, pulse oximetry and re-evaluation of patient's condition.  Glori Bickers, MD  5:32 PM

## 2019-04-04 NOTE — Progress Notes (Signed)
Talked with patient about trying the trach collar for about 2 hours tonight.  Patient shook his head no, patient is alert and not feeling well.  Refrained from trying trach collar for 2 hours at this time.  Will continue to monitor.  Hopefully we can try later.

## 2019-04-04 NOTE — Progress Notes (Addendum)
Patient ID: Jesse Murphy, male   DOB: [birth year], 86 y.o.   MRN: [id number] S: Not able to UF over the past 24 hours due to hypotension.   O:BP (!) 116/54   Pulse 75   Temp 98.3 F (36.8 C) (Axillary)   Resp 17   Ht '5\' 8"'  (1.727 m)   Wt 91.4 kg   SpO2 99%   BMI 30.64 kg/m   Intake/Output Summary (Last 24 hours) at 04/04/2019 1244 Last data filed at 04/04/2019 1200 Gross per 24 hour  Intake 5239.42 ml  Output 2951 ml  Net 2288.42 ml   Intake/Output: I/O last 3 completed shifts: In: 6825 [I.V.:3558.8; Other:520.1; NG/GT:1460; IV Piggyback:1286.1] Out: 6387 [Urine:125; Other:5372]  Intake/Output this shift:  Total I/O In: 1254.3 [I.V.:546.2; Blood:315; Other:27.1; NG/GT:316; IV Piggyback:50] Out: 630 [Other:630] Weight change: -3.7 kg Gen: on vent via trach, sedated CVS: no rub  Resp: occ rhonchi Abd: obese, +BS, soft Ext: 1+edema  Recent Labs  Lab 03/28/19 1532 03/28/19 1539 03/29/19 0417 03/29/19 0425 03/29/19 1541 03/29/19 1552 03/30/19 0255 03/30/19 0546 03/30/19 1510 03/30/19 1510 03/31/19 0408 03/31/19 0408 03/31/19 1510 03/31/19 1510 04/01/19 0400 04/01/19 0455 04/01/19 1844 04/01/19 1844 04/02/19 0349 04/02/19 0450 04/02/19 1612 04/02/19 2331 04/03/19 0501 04/03/19 0548 04/03/19 1600 04/04/19 0355 04/04/19 0418  NA 131*   < > 130*   < > 131*   < > 132*   < > 132*   < > 133*  134*   < > 134*   < > 134*   < > 134*   < > 136   < > 134* 134* 136 133* 133* 133* 133*  K 3.7   < > 3.7   < > 3.7   < > 3.6   < > 4.1   < > 4.3  4.4   < > 4.1   < > 4.5   < > 4.4   < > 4.3   < > 4.6 4.2 4.2 4.2 4.6 4.3 4.2  CL 91*   < > 92*   < > 92*   < > 96*   < > 97*   < > 99   < > 100   < > 100   < > 102  --  101  --  99 99  --  97* 100 98  --   CO2 20*   < > 20*   < > 20*   < > 22   < > 23   < > 23   < > 22   < > 24  --  22  --  25  --  23  --   --  '25 22 22  ' --   GLUCOSE 180*   < > 196*   < > 175*   < > 163*   < > 151*   < > 182*   < > 194*   < > 187*   < > 200*  --   188*  --  205* 166*  --  253* 201* 196*  --   BUN 177*   < > 191*   < > 172*   < > 130*   < > 97*   < > 70*   < > 58*   < > 49*   < > 47*  --  43*  --  44* 40*  --  40* 43* 38*  --   CREATININE 4.61*   < > 5.04*   < >  4.43*   < > 3.47*   < > 2.73*   < > 2.32*   < > 2.03*   < > 1.89*   < > 1.79*  --  1.83*  --  1.75* 1.70*  --  1.78* 1.86* 1.77*  --   ALBUMIN 2.2*   < > 2.1*   < > 2.2*   < > 2.0*   < > 2.0*   < > 2.0*   < > 2.0*   < > 2.0*  --  2.0*  --  2.0*  --  2.0*  --   --  1.9* 1.8* 2.2*  --   CALCIUM 7.5*   < > 7.5*   < > 7.5*   < > 7.5*   < > 7.4*   < > 7.8*   < > 7.7*   < > 7.9*  --  7.6*  --  7.9*  --  7.5*  --   --  7.6* 7.6* 7.5*  --   PHOS 8.1*  --   --   --  8.5*  --   --   --  4.6  --   --   --  3.3  --   --   --  2.3*  --   --   --  1.9*  --   --   --  2.2*  --   --   AST  --   --  52*  --   --   --  47*  --   --   --  47*  --   --   --  35  --   --   --  35  --   --   --   --  33  --  39  --   ALT  --   --  32  --   --   --  24  --   --   --  24  --   --   --  20  --   --   --  20  --   --   --   --  24  --  25  --    < > = values in this interval not displayed.   Liver Function Tests: Recent Labs  Lab 04/02/19 0349 04/02/19 1612 04/03/19 0548 04/03/19 1600 04/04/19 0355  AST 35  --  33  --  39  ALT 20  --  24  --  25  ALKPHOS 74  --  76  --  73  BILITOT 0.9  --  0.9  --  0.9  PROT 5.5*  --  5.3*  --  5.5*  ALBUMIN 2.0*   < > 1.9* 1.8* 2.2*   < > = values in this interval not displayed.   No results for input(s): LIPASE, AMYLASE in the last 168 hours. No results for input(s): AMMONIA in the last 168 hours. CBC: Recent Labs  Lab 04/02/19 1242 04/02/19 1242 04/02/19 1612 04/02/19 2331 04/03/19 0406 04/03/19 0501 04/03/19 1600 04/04/19 0355 04/04/19 0418  WBC 35.0*   < > 33.3*  --  32.3*  --  36.1* 25.8*  --   HGB 9.9*   < > 9.4*   < > 8.9*   < > 8.4* 6.8* 6.8*  HCT 31.4*   < > 29.4*   < > 28.2*   < > 26.9* 21.9* 20.0*  MCV 93.2  --  90.7  --  92.2  --  94.1  94.8  --   PLT 146*   < > 135*  --  134*  --  127* 104*  --    < > = values in this interval not displayed.   Cardiac Enzymes: No results for input(s): CKTOTAL, CKMB, CKMBINDEX, TROPONINI in the last 168 hours. CBG: Recent Labs  Lab 04/03/19 2032 04/03/19 2324 04/04/19 0414 04/04/19 0833 04/04/19 1209  GLUCAP 150* 146* 194* 163* 142*    Iron Studies: No results for input(s): IRON, TIBC, TRANSFERRIN, FERRITIN in the last 72 hours. Studies/Results: DG CHEST PORT 1 VIEW  Result Date: 04/04/2019 CLINICAL DATA:  Tracheostomy tube and ventilator EXAM: PORTABLE CHEST 1 VIEW COMPARISON:  Yesterday FINDINGS: Impella in stable position. Postoperative heart with CABG and left atrial clipping. Heart size is normal and stable. Feeding tube reaches diaphragm. The orogastric tube is more difficult to separately visualized at the level of the diaphragm, likely due to feeding tube overlap. Right PICC and left subclavian line with tips near the upper cavoatrial junction. Tracheostomy tube in place. Haziness of the left lower chest attributed atelectasis and pleural fluid. No Kerley lines. No visible pneumothorax IMPRESSION: 1. Stable hardware positioning as described. 2. Haziness at the left based attributed atelectasis and pleural fluid. Electronically Signed   By: Monte Fantasia M.D.   On: 04/04/2019 09:04   DG CHEST PORT 1 VIEW  Result Date: 04/03/2019 CLINICAL DATA:  Check Impella device EXAM: PORTABLE CHEST 1 VIEW COMPARISON:  04/02/2019 FINDINGS: Cardiac shadow is stable. Postsurgical changes are again seen. Right jugular sheath, right-sided PICC line and left subclavian temporary dialysis catheter are again noted and stable. Tracheostomy tube, nasogastric catheter and feeding catheter are noted as well. Impella device is noted in stable position. Changes of mild edema and small effusions are again identified. No new focal abnormality is seen. IMPRESSION: Stable appearance of the chest when compared  with the prior exam. Electronically Signed   By: Inez Catalina M.D.   On: 04/03/2019 08:36   . sodium chloride   Intravenous Once  . aspirin  81 mg Per Tube Daily  . atorvastatin  80 mg Per Tube q1800  . B-complex with vitamin C  1 tablet Oral Daily  . bisacodyl  10 mg Oral Daily   Or  . bisacodyl  10 mg Rectal Daily  . chlorhexidine gluconate (MEDLINE KIT)  15 mL Mouth Rinse BID  . Chlorhexidine Gluconate Cloth  6 each Topical Daily  . docusate  200 mg Per Tube Daily  . feeding supplement (PRO-STAT SUGAR FREE 64)  60 mL Per Tube TID  . Gerhardt's butt cream   Topical BID  . insulin aspart  0-24 Units Subcutaneous Q4H  . insulin aspart  5 Units Subcutaneous Q4H  . insulin glargine  26 Units Subcutaneous Daily  . mouth rinse  15 mL Mouth Rinse 10 times per day  . midodrine  10 mg Oral TID WC  . pantoprazole (PROTONIX) IV  40 mg Intravenous Q24H  . sodium chloride flush  10-40 mL Intracatheter Q12H  . tamsulosin  0.4 mg Oral Daily    BMET    Component Value Date/Time   NA 133 (L) 04/04/2019 0418   K 4.2 04/04/2019 0418   CL 98 04/04/2019 0355   CO2 22 04/04/2019 0355   GLUCOSE 196 (H) 04/04/2019 0355   BUN 38 (H) 04/04/2019 0355   CREATININE 1.77 (H) 04/04/2019 0355   CALCIUM 7.5 (L) 04/04/2019 0355   GFRNONAA  34 (L) 04/04/2019 0355   GFRAA 39 (L) 04/04/2019 0355   CBC    Component Value Date/Time   WBC 25.8 (H) 04/04/2019 0355   RBC 2.31 (L) 04/04/2019 0355   HGB 6.8 (LL) 04/04/2019 0418   HCT 20.0 (L) 04/04/2019 0418   PLT 104 (L) 04/04/2019 0355   MCV 94.8 04/04/2019 0355   MCH 29.4 04/04/2019 0355   MCHC 31.1 04/04/2019 0355   RDW 16.9 (H) 04/04/2019 0355   LYMPHSABS 0.6 (L) 03/13/2019 1056   MONOABS 0.5 03/13/2019 1056   EOSABS 0.1 03/13/2019 1056   BASOSABS 0.0 03/13/2019 1056    Assessment/Plan:  1. AKI(Cr was 0.7 prior to surgery)- due to postoperative CHF/cardiogenic shock that was refractory to diuretics and started on CRRT on 03/29/19 for volume  removal. Tolerating CRRT with volume removal of 200 ml/hr. UF limited by low BP but is down 9 kg since starting CRRT. 1. Unable to UF over the last 24 hours due to hypotension (had episode of a fib with rvr). 2. Would not offer more CVVHD by the end of this week if he does not show any significant improvement as he is not a longterm dialysis candidate and CVVHD was only initiated to improve his volume status. 3. Recommend palliative care consult to help set goals/limits of care.  2. Acute systolic CHF/cardiogenic shock- EF 25-30% currently on Impella. Continue with CVVHD and UF as tolerated. 3. CAD s/p CABG on 8/72/76 complicated by cardiogenic shock and ARF. 4. Acute hypoxic respiratory failure with pseudomonas pneumonia- s/p trach on 03/27/19. Cont with fortaz per PCCM 5. PAF- s/p MAZE and LAA occlusion- on amio per CHF team 6. Hematuria- bladder irrigation 7. ABLA- Has Melena. Transfuse as needed. Received 2 units PRBC's overnight with appropriate increase in Hgb from 7.6 to 10.2. Continue to follow H/H and transfuse prn.  8. Protein malnutrition- moderate to severe 9. Hypophosphatemia- unfortunately no phos level this morning will recheck later today and will likely require repletion. 10. Vascular access- left [place] HD catheter placed 03/29/19 11. Disposition- poor overall prognosis. He is not a longterm dialysis candidate given his cardiogenic shock and trach. Recommend palliative care consult to help set goals/limits of care.  Donetta Potts, MD [employer] [phone]

## 2019-04-04 NOTE — Progress Notes (Addendum)
Pharmacy Antibiotic Note  Jesse Murphy is a 86 y.o. male admitted on 03/13/2019 with sepsis.  Pharmacy has been consulted for vancomycin dosing.  Started on cefepime>ceftazidime since 2/8 for pseudomonas PNA- finished yesterday. WBC today decreased to 25.8 with addition of vancomycin. Remains on CRRT- Scr 1.77. Remains on multiple pressors - however, trending down. Received extra vancomycin dose on 2/16 at [place] after bolus dose.   Discussed with Dr. Prescott Gum, will continue vancomycin for now given elevated WBC.  Plan: Will order vancomycin random and restart vancomycin accordingly Monitor renal fx, cx results, clinical pic, and duration of therapy  Height: 5\' 8"  (172.7 cm) Weight: 201 lb 8 oz (91.4 kg) IBW/kg (Calculated) : 68.4  Temp (24hrs), Avg:98.1 F (36.7 C), Min:97.8 F (36.6 C), Max:98.7 F (37.1 C)  Recent Labs  Lab 04/02/19 0349 04/02/19 1242 04/02/19 1612 04/02/19 2331 04/03/19 0406 04/03/19 0548 04/03/19 1600 04/04/19 0355  WBC  --  35.0* 33.3*  --  32.3*  --  36.1* 25.8*  CREATININE   < >  --  1.75* 1.70*  --  1.78* 1.86* 1.77*   < > = values in this interval not displayed.    Estimated Creatinine Clearance: 31.7 mL/min (A) (by C-G formula based on SCr of 1.77 mg/dL (H)).    No Known Allergies  Antimicrobials this admission: CTX 2/1 >> 2/7 Cefepime 2/8 >> 2/10 Fortaz 2/10 >> 2/16 Vanc 2/16>>   Dose adjustments this admission: N/A  Microbiology results: 2/16 BCx: ngtd 2/16 TA: few pseudomonas 2/8 resp cx - Pseudomonas- R - cefepime (not included on report, S fortaz,cipro, gent, primaxin) 2/1 TA: normal flora 1/28 MRSA: neg 1/26 COVID/flu: neg  Thank you for allowing pharmacy to be a part of this patient's care.  Marguerite Olea, [facility] Clinical Pharmacist Phone [phone]  04/04/2019 12:43 PM

## 2019-04-04 NOTE — H&P (View-Only) (Signed)
Urology Consult  Referring physician: R Agarwala Reason for referral: clot retention   Chief Complaint: clot retention  History of Present Illness: clot retention; reviewed chart; NP placed 18 fr coude for retention/cath not draining/unable to place larger 3 way No blood thinner Previous prostate radiation Low output scanned for 270 bloody  Past Medical History:  Diagnosis Date  . Atrial fibrillation ([place])   . CAD (coronary artery disease)    a. s/p PCI in 1992 b. Coronary CT in 01/2018 showing extensive coronary calcification; FFR indeterminate --> medical management pursued at that time as patient not interested in repeat cath  . Cancer [facility])    prostate  . Dyspnea   . Hypertension    Past Surgical History:  Procedure Laterality Date  . CLIPPING OF ATRIAL APPENDAGE N/A 03/16/2019   Procedure: CLIPPING OF LEFT  ATRIAL APPENDAGE - USING ATRICLIP SIZE 40;  Surgeon: Ivin Poot, MD;  Location: [place];  Service: Open Heart Surgery;  Laterality: N/A;  . COLONOSCOPY N/A 08/25/2017   Procedure: COLONOSCOPY;  Surgeon: Rogene Houston, MD;  [facility];  Service: Endoscopy;  Laterality: N/A;  . CORONARY ARTERY BYPASS GRAFT N/A 03/16/2019   Procedure: CORONARY ARTERY BYPASS GRAFTING (CABG), ON PUMP, TIMES THREE, USING LEFT INTERNAL MAMMARY ARTERY, RIGHT GREATER SAPHENOUS VEIN HARVESTED ENDOSCOPICALLY;  Surgeon: Ivin Poot, MD;  Location: [place];  Service: Open Heart Surgery;  Laterality: N/A;  swan only  . HERNIA REPAIR    . MAZE N/A 03/16/2019   Procedure: MAZE;  Surgeon: Ivin Poot, MD;  Location: [place];  Service: Open Heart Surgery;  Laterality: N/A;  . PLACEMENT OF IMPELLA LEFT VENTRICULAR ASSIST DEVICE Right 03/27/2019   Procedure: Placement Of Impella Left Ventricular Assist Device using ABIOMED Impella 5.5 with SmartAssist Device.;  Surgeon: Wonda [place], MD;  [facility];  Service: Thoracic;  Laterality: Right;  . PROSTATE SURGERY    . RIGHT/LEFT HEART  CATH AND CORONARY ANGIOGRAPHY N/A 03/15/2019   Procedure: RIGHT/LEFT HEART CATH AND CORONARY ANGIOGRAPHY;  Surgeon: Burnell Blanks, MD;  Location: [facility];  Service: Cardiovascular;  Laterality: N/A;  . TEE WITHOUT CARDIOVERSION N/A 03/16/2019   Procedure: TRANSESOPHAGEAL ECHOCARDIOGRAM (TEE);  Surgeon: Prescott Gum, Collier Salina, MD;  Location: [place];  Service: Open Heart Surgery;  Laterality: N/A;  . TRACHEOSTOMY TUBE PLACEMENT N/A 03/27/2019   Procedure: TRACHEOSTOMY placed using Shiley 8DCT Cuffed.;  Surgeon: Wonda [place]s, MD;  [facility];  Service: Thoracic;  Laterality: N/A;    Medications: I have reviewed the patient's current medications. Allergies: No Known Allergies  Family History  Problem Relation Age of Onset  . CAD Mother 76  . CAD Father 39  . CAD Sister   . CAD Brother    Social History:  reports that he quit smoking about 29 years ago. His smoking use included cigarettes. He has a 12.50 pack-year smoking history. He has never used smokeless tobacco. He reports that he does not drink alcohol or use drugs.  ROS: All systems are reviewed and negative except as noted. Negative limited  Physical Exam:  Vital signs in last 24 hours: Temp:  [97.8 F (36.6 C)-98.4 F (36.9 C)] 98.3 F (36.8 C) (02/17 1045) Pulse Rate:  [73-135] 75 (02/17 1000) Resp:  [14-33] 17 (02/17 1045) BP: (77-131)/(51-61) 116/54 (02/17 0330) SpO2:  [76 %-99 %] 99 % (02/17 1045) Arterial Line BP: (73-155)/(40-82) 125/46 (02/17 1045) FiO2 (%):  [40 %] 40 % (02/17 1012)  Weight:  [91.4 kg] 91.4 kg (02/17 0500)  Cardiovascular: Skin warm; not flushed Respiratory: Breaths quiet; no shortness of breath Abdomen: No masses Neurological: Normal sensation to touch Musculoskeletal: Normal motor function arms and legs Lymphatics: No inguinal adenopathy Skin: No rashes Genitourinary:18 not draining blood in bag   Laboratory Data:  Results for orders placed or performed during the  hospital encounter of 03/13/19 (from the past 72 hour(s))  Glucose, capillary     Status: Abnormal   Collection Time: 04/01/19  5:41 PM  Result Value Ref Range   Glucose-Capillary 185 (H) 70 - 99 mg/dL  Renal function panel     Status: Abnormal   Collection Time: 04/01/19  6:44 PM  Result Value Ref Range   Sodium 134 (L) 135 - 145 mmol/L   Potassium 4.4 3.5 - 5.1 mmol/L   Chloride 102 98 - 111 mmol/L   CO2 22 22 - 32 mmol/L   Glucose, Bld 200 (H) 70 - 99 mg/dL   BUN 47 (H) 8 - 23 mg/dL   Creatinine, Ser 1.79 (H) 0.61 - 1.24 mg/dL   Calcium 7.6 (L) 8.9 - 10.3 mg/dL   Phosphorus 2.3 (L) 2.5 - 4.6 mg/dL   Albumin 2.0 (L) 3.5 - 5.0 g/dL   GFR calc non Af Amer 33 (L) >60 mL/min   GFR calc Af Amer 38 (L) >60 mL/min   Anion gap 10 5 - 15    Comment: Performed at [facility], [address] [place], [place] [address]  Heparin level (unfractionated)     Status: Abnormal   Collection Time: 04/01/19  6:44 PM  Result Value Ref Range   Heparin Unfractionated <0.10 (L) 0.30 - 0.70 IU/mL    Comment: REPEATED TO VERIFY (NOTE) If heparin results are below expected values, and patient dosage has  been confirmed, suggest follow up testing of antithrombin III levels. Performed at [facility], [place] [address] [place], [place] [address]   POCT Activated clotting time     Status: None   Collection Time: 04/01/19  7:03 PM  Result Value Ref Range   Activated Clotting Time 131 seconds  CBC     Status: Abnormal   Collection Time: 04/01/19  8:04 PM  Result Value Ref Range   WBC 24.9 (H) 4.0 - 10.5 K/uL   RBC 3.52 (L) 4.22 - 5.81 MIL/uL   Hemoglobin 10.2 (L) 13.0 - 17.0 g/dL    Comment: REPEATED TO VERIFY POST TRANSFUSION SPECIMEN    HCT 31.5 (L) 39.0 - 52.0 %   MCV 89.5 80.0 - 100.0 fL   MCH 29.0 26.0 - 34.0 pg   MCHC 32.4 30.0 - 36.0 g/dL   RDW 15.3 11.5 - 15.5 %   Platelets 169 150 - 400 K/uL    Comment: REPEATED TO VERIFY   nRBC 0.2 0.0 - 0.2 %    Comment: Performed at  [facility], [place] [address], [place], [place] [address]  Glucose, capillary     Status: Abnormal   Collection Time: 04/01/19  8:12 PM  Result Value Ref Range   Glucose-Capillary 170 (H) 70 - 99 mg/dL  POCT Activated clotting time     Status: None   Collection Time: 04/01/19  8:28 PM  Result Value Ref Range   Activated Clotting Time 131 seconds  POCT Activated clotting time     Status: None   Collection Time: 04/01/19 10:07 PM  Result Value Ref Range   Activated Clotting Time  147 seconds  POCT Activated clotting time     Status: None   Collection Time: 04/01/19 11:12 PM  Result Value Ref Range   Activated Clotting Time 153 seconds  Glucose, capillary     Status: Abnormal   Collection Time: 04/02/19 12:11 AM  Result Value Ref Range   Glucose-Capillary 173 (H) 70 - 99 mg/dL  POCT Activated clotting time     Status: None   Collection Time: 04/02/19 12:23 AM  Result Value Ref Range   Activated Clotting Time 169 seconds  POCT Activated clotting time     Status: None   Collection Time: 04/02/19  2:07 AM  Result Value Ref Range   Activated Clotting Time 175 seconds  POCT Activated clotting time     Status: None   Collection Time: 04/02/19  3:12 AM  Result Value Ref Range   Activated Clotting Time 180 seconds  Cooxemetry Panel (carboxy, met, total hgb, O2 sat)     Status: Abnormal   Collection Time: 04/02/19  3:49 AM  Result Value Ref Range   Total hemoglobin 11.3 (L) 12.0 - 16.0 g/dL   O2 Saturation 71.0 %   Carboxyhemoglobin 1.4 0.5 - 1.5 %   Methemoglobin 0.8 0.0 - 1.5 %    Comment: Performed at [facility], [place] [address], [place], [place] [address]  LDH     Status: Abnormal   Collection Time: 04/02/19  3:49 AM  Result Value Ref Range   LDH 247 (H) 98 - 192 U/L    Comment: Performed at [facility] [address] [place], [place] [address]  CBC     Status: Abnormal   Collection Time: 04/02/19  3:49 AM  Result Value Ref Range   WBC 30.6 (H) 4.0 - 10.5  K/uL   RBC 3.36 (L) 4.22 - 5.81 MIL/uL   Hemoglobin 10.0 (L) 13.0 - 17.0 g/dL   HCT 30.4 (L) 39.0 - 52.0 %   MCV 90.5 80.0 - 100.0 fL   MCH 29.8 26.0 - 34.0 pg   MCHC 32.9 30.0 - 36.0 g/dL   RDW 15.7 (H) 11.5 - 15.5 %   Platelets 158 150 - 400 K/uL   nRBC 0.3 (H) 0.0 - 0.2 %    Comment: Performed at [facility] [address], [place], [place] [address]  Comprehensive metabolic panel     Status: Abnormal   Collection Time: 04/02/19  3:49 AM  Result Value Ref Range   Sodium 136 135 - 145 mmol/L   Potassium 4.3 3.5 - 5.1 mmol/L   Chloride 101 98 - 111 mmol/L   CO2 25 22 - 32 mmol/L   Glucose, Bld 188 (H) 70 - 99 mg/dL   BUN 43 (H) 8 - 23 mg/dL   Creatinine, Ser 1.83 (H) 0.61 - 1.24 mg/dL   Calcium 7.9 (L) 8.9 - 10.3 mg/dL   Total Protein 5.5 (L) 6.5 - 8.1 g/dL   Albumin 2.0 (L) 3.5 - 5.0 g/dL   AST 35 15 - 41 U/L   ALT 20 0 - 44 U/L   Alkaline Phosphatase 74 38 - 126 U/L   Total Bilirubin 0.9 0.3 - 1.2 mg/dL   GFR calc non Af Amer 32 (L) >60 mL/min   GFR calc Af Amer 37 (L) >60 mL/min   Anion gap 10 5 - 15    Comment: Performed at [facility], [place] [address], [place], [place] [address]  Heparin level (unfractionated)  Status: None   Collection Time: 04/02/19  3:49 AM  Result Value Ref Range   Heparin Unfractionated 0.35 0.30 - 0.70 IU/mL    Comment: (NOTE) If heparin results are below expected values, and patient dosage has  been confirmed, suggest follow up testing of antithrombin III levels. Performed at [facility], [place] [address], [place], [place] [address]   Magnesium     Status: Abnormal   Collection Time: 04/02/19  3:49 AM  Result Value Ref Range   Magnesium 2.5 (H) 1.7 - 2.4 mg/dL    Comment: Performed at [facility] [address], [place], [place] [address]  APTT     Status: Abnormal   Collection Time: 04/02/19  3:49 AM  Result Value Ref Range   aPTT 156 (H) 24 - 36 seconds    Comment:        IF BASELINE aPTT IS  ELEVATED, SUGGEST PATIENT RISK ASSESSMENT BE USED TO DETERMINE APPROPRIATE ANTICOAGULANT THERAPY. Performed at [facility], [place] [address] [place], [place] [address]   Glucose, capillary     Status: Abnormal   Collection Time: 04/02/19  3:54 AM  Result Value Ref Range   Glucose-Capillary 170 (H) 70 - 99 mg/dL  POCT Activated clotting time     Status: None   Collection Time: 04/02/19  4:15 AM  Result Value Ref Range   Activated Clotting Time 180 seconds  I-STAT 7, (LYTES, BLD GAS, ICA, H+H)     Status: Abnormal   Collection Time: 04/02/19  4:50 AM  Result Value Ref Range   pH, Arterial 7.426 7.350 - 7.450   pCO2 arterial 40.3 32.0 - 48.0 mmHg   pO2, Arterial 66.0 (L) 83.0 - 108.0 mmHg   Bicarbonate 26.6 20.0 - 28.0 mmol/L   TCO2 28 22 - 32 mmol/L   O2 Saturation 93.0 %   Acid-Base Excess 2.0 0.0 - 2.0 mmol/L   Sodium 134 (L) 135 - 145 mmol/L   Potassium 4.7 3.5 - 5.1 mmol/L   Calcium, Ion 1.11 (L) 1.15 - 1.40 mmol/L   HCT 30.0 (L) 39.0 - 52.0 %   Hemoglobin 10.2 (L) 13.0 - 17.0 g/dL   Patient temperature 98.2 F    Sample type ARTERIAL   POCT Activated clotting time     Status: None   Collection Time: 04/02/19  5:24 AM  Result Value Ref Range   Activated Clotting Time 186 seconds  POCT Activated clotting time     Status: None   Collection Time: 04/02/19  6:32 AM  Result Value Ref Range   Activated Clotting Time 186 seconds  Glucose, capillary     Status: Abnormal   Collection Time: 04/02/19  7:25 AM  Result Value Ref Range   Glucose-Capillary 192 (H) 70 - 99 mg/dL  POCT Activated clotting time     Status: None   Collection Time: 04/02/19  7:58 AM  Result Value Ref Range   Activated Clotting Time 197 seconds  Glucose, capillary     Status: Abnormal   Collection Time: 04/02/19 11:13 AM  Result Value Ref Range   Glucose-Capillary 173 (H) 70 - 99 mg/dL   Comment 1 Glucose Stabilizer    Comment 2 Procedure Error   CBC     Status: Abnormal   Collection Time:  04/02/19 12:42 PM  Result Value Ref Range   WBC 35.0 (H) 4.0 - 10.5 K/uL   RBC 3.37 (L) 4.22 - 5.81 MIL/uL   Hemoglobin 9.9 (  L) 13.0 - 17.0 g/dL   HCT 31.4 (L) 39.0 - 52.0 %   MCV 93.2 80.0 - 100.0 fL   MCH 29.4 26.0 - 34.0 pg   MCHC 31.5 30.0 - 36.0 g/dL   RDW 16.1 (H) 11.5 - 15.5 %   Platelets 146 (L) 150 - 400 K/uL   nRBC 0.5 (H) 0.0 - 0.2 %    Comment: Performed at [facility] [address] [place], [place] [address]  Heparin level (unfractionated)     Status: None   Collection Time: 04/02/19  1:48 PM  Result Value Ref Range   Heparin Unfractionated 0.42 0.30 - 0.70 IU/mL    Comment: (NOTE) If heparin results are below expected values, and patient dosage has  been confirmed, suggest follow up testing of antithrombin III levels. Performed at [facility], [place] [address], [place],  [address]   Renal function panel     Status: Abnormal   Collection Time: 04/02/19  4:12 PM  Result Value Ref Range   Sodium 134 (L) 135 - 145 mmol/L   Potassium 4.6 3.5 - 5.1 mmol/L   Chloride 99 98 - 111 mmol/L   CO2 23 22 - 32 mmol/L   Glucose, Bld 205 (H) 70 - 99 mg/dL   BUN 44 (H) 8 - 23 mg/dL   Creatinine, Ser 1.75 (H) 0.61 - 1.24 mg/dL   Calcium 7.5 (L) 8.9 - 10.3 mg/dL   Phosphorus 1.9 (L) 2.5 - 4.6 mg/dL   Albumin 2.0 (L) 3.5 - 5.0 g/dL   GFR calc non Af Amer 34 (L) >60 mL/min   GFR calc Af Amer 39 (L) >60 mL/min   Anion gap 12 5 - 15    Comment: Performed at [facility] [address] [place], [place] [address]  CBC     Status: Abnormal   Collection Time: 04/02/19  4:12 PM  Result Value Ref Range   WBC 33.3 (H) 4.0 - 10.5 K/uL    Comment: REPEATED TO VERIFY [place] COUNT CONFIRMED ON SMEAR    RBC 3.24 (L) 4.22 - 5.81 MIL/uL   Hemoglobin 9.4 (L) 13.0 - 17.0 g/dL   HCT 29.4 (L) 39.0 - 52.0 %   MCV 90.7 80.0 - 100.0 fL   MCH 29.0 26.0 - 34.0 pg   MCHC 32.0 30.0 - 36.0 g/dL   RDW 15.9 (H) 11.5 - 15.5 %   Platelets 135 (L) 150 - 400 K/uL    Comment:  REPEATED TO VERIFY   nRBC 0.5 (H) 0.0 - 0.2 %    Comment: Performed at [facility], [place] [address], [place], [place] [address]  Glucose, capillary     Status: Abnormal   Collection Time: 04/02/19  4:16 PM  Result Value Ref Range   Glucose-Capillary 206 (H) 70 - 99 mg/dL  Glucose, capillary     Status: Abnormal   Collection Time: 04/02/19  8:33 PM  Result Value Ref Range   Glucose-Capillary 199 (H) 70 - 99 mg/dL  Heparin level (unfractionated)     Status: Abnormal   Collection Time: 04/02/19 11:00 PM  Result Value Ref Range   Heparin Unfractionated 0.11 (L) 0.30 - 0.70 IU/mL    Comment: (NOTE) If heparin results are below expected values, and patient dosage has  been confirmed, suggest follow up testing of antithrombin III levels. Performed at [facility], [place] [address], [place], [place] [address]   Glucose, capillary  Status: Abnormal   Collection Time: 04/02/19 11:26 PM  Result Value Ref Range   Glucose-Capillary 154 (H) 70 - 99 mg/dL  I-STAT, chem 8     Status: Abnormal   Collection Time: 04/02/19 11:31 PM  Result Value Ref Range   Sodium 134 (L) 135 - 145 mmol/L   Potassium 4.2 3.5 - 5.1 mmol/L   Chloride 99 98 - 111 mmol/L   BUN 40 (H) 8 - 23 mg/dL   Creatinine, Ser 1.70 (H) 0.61 - 1.24 mg/dL   Glucose, Bld 166 (H) 70 - 99 mg/dL   Calcium, Ion 1.11 (L) 1.15 - 1.40 mmol/L   TCO2 26 22 - 32 mmol/L   Hemoglobin 9.9 (L) 13.0 - 17.0 g/dL   HCT 29.0 (L) 39.0 - 52.0 %  CBC     Status: Abnormal   Collection Time: 04/03/19  4:06 AM  Result Value Ref Range   WBC 32.3 (H) 4.0 - 10.5 K/uL   RBC 3.06 (L) 4.22 - 5.81 MIL/uL   Hemoglobin 8.9 (L) 13.0 - 17.0 g/dL   HCT 28.2 (L) 39.0 - 52.0 %   MCV 92.2 80.0 - 100.0 fL   MCH 29.1 26.0 - 34.0 pg   MCHC 31.6 30.0 - 36.0 g/dL   RDW 16.2 (H) 11.5 - 15.5 %   Platelets 134 (L) 150 - 400 K/uL   nRBC 0.4 (H) 0.0 - 0.2 %    Comment: Performed at [facility], [address], [place], [place] [address]  LDH      Status: Abnormal   Collection Time: 04/03/19  4:06 AM  Result Value Ref Range   LDH 265 (H) 98 - 192 U/L    Comment: Performed at [facility] [address], [place], [place] [address]  Magnesium     Status: Abnormal   Collection Time: 04/03/19  4:06 AM  Result Value Ref Range   Magnesium 2.5 (H) 1.7 - 2.4 mg/dL    Comment: Performed at [facility] [address] [place], [place] [address]  APTT     Status: Abnormal   Collection Time: 04/03/19  4:06 AM  Result Value Ref Range   aPTT 90 (H) 24 - 36 seconds    Comment:        IF BASELINE aPTT IS ELEVATED, SUGGEST PATIENT RISK ASSESSMENT BE USED TO DETERMINE APPROPRIATE ANTICOAGULANT THERAPY. Performed at [facility], [place] [address] [place], [place] [address]   Glucose, capillary     Status: Abnormal   Collection Time: 04/03/19  4:16 AM  Result Value Ref Range   Glucose-Capillary 183 (H) 70 - 99 mg/dL  Cooxemetry Panel (carboxy, met, total hgb, O2 sat)     Status: None   Collection Time: 04/03/19  4:20 AM  Result Value Ref Range   Total hemoglobin 12.7 12.0 - 16.0 g/dL   O2 Saturation 69.1 %   Carboxyhemoglobin 1.5 0.5 - 1.5 %   Methemoglobin 0.9 0.0 - 1.5 %    Comment: Performed at [facility], [place] [address] [place], [place] [address]  I-STAT 7, (LYTES, BLD GAS, ICA, H+H)     Status: Abnormal   Collection Time: 04/03/19  5:01 AM  Result Value Ref Range   pH, Arterial 7.447 7.350 - 7.450   pCO2 arterial 35.0 32.0 - 48.0 mmHg   pO2, Arterial 70.0 (L) 83.0 - 108.0 mmHg   Bicarbonate 24.2 20.0 - 28.0 mmol/L   TCO2 25 22 -  32 mmol/L   O2 Saturation 95.0 %   Sodium 136 135 - 145 mmol/L   Potassium 4.2 3.5 - 5.1 mmol/L   Calcium, Ion 1.05 (L) 1.15 - 1.40 mmol/L   HCT 25.0 (L) 39.0 - 52.0 %   Hemoglobin 8.5 (L) 13.0 - 17.0 g/dL   Patient temperature 98.3 F    Sample type ARTERIAL   Comprehensive metabolic panel     Status: Abnormal   Collection Time: 04/03/19  5:48 AM  Result Value Ref Range    Sodium 133 (L) 135 - 145 mmol/L   Potassium 4.2 3.5 - 5.1 mmol/L   Chloride 97 (L) 98 - 111 mmol/L   CO2 25 22 - 32 mmol/L   Glucose, Bld 253 (H) 70 - 99 mg/dL   BUN 40 (H) 8 - 23 mg/dL   Creatinine, Ser 1.78 (H) 0.61 - 1.24 mg/dL   Calcium 7.6 (L) 8.9 - 10.3 mg/dL   Total Protein 5.3 (L) 6.5 - 8.1 g/dL   Albumin 1.9 (L) 3.5 - 5.0 g/dL   AST 33 15 - 41 U/L   ALT 24 0 - 44 U/L   Alkaline Phosphatase 76 38 - 126 U/L   Total Bilirubin 0.9 0.3 - 1.2 mg/dL   GFR calc non Af Amer 33 (L) >60 mL/min   GFR calc Af Amer 39 (L) >60 mL/min   Anion gap 11 5 - 15    Comment: Performed at [facility], [place] [address] [place], [place] [address]  Heparin level (unfractionated)     Status: Abnormal   Collection Time: 04/03/19  7:33 AM  Result Value Ref Range   Heparin Unfractionated 0.18 (L) 0.30 - 0.70 IU/mL    Comment: (NOTE) If heparin results are below expected values, and patient dosage has  been confirmed, suggest follow up testing of antithrombin III levels. Performed at [facility], [place] [address], [place], [place] [address]   Glucose, capillary     Status: Abnormal   Collection Time: 04/03/19  7:45 AM  Result Value Ref Range   Glucose-Capillary 166 (H) 70 - 99 mg/dL  Culture, respiratory (non-expectorated)     Status: None (Preliminary result)   Collection Time: 04/03/19 11:08 AM   Specimen: Tracheal Aspirate; Respiratory  Result Value Ref Range   Specimen Description TRACHEAL ASPIRATE    Special Requests NONE    Gram Stain      FEW WBC PRESENT, PREDOMINANTLY PMN FEW GRAM NEGATIVE RODS RARE YEAST Performed at [facility], [place] [address], [place], [place] [address]    Culture FEW PSEUDOMONAS AERUGINOSA    Report Status PENDING   Glucose, capillary     Status: Abnormal   Collection Time: 04/03/19 11:49 AM  Result Value Ref Range   Glucose-Capillary 151 (H) 70 - 99 mg/dL  Culture, blood (Routine X 2) w Reflex to ID Panel     Status: None (Preliminary  result)   Collection Time: 04/03/19 12:30 PM   Specimen: BLOOD LEFT HAND  Result Value Ref Range   Specimen Description BLOOD LEFT HAND    Special Requests      BOTTLES DRAWN AEROBIC ONLY Blood Culture results may not be optimal due to an inadequate volume of blood received in culture bottles   Culture      NO GROWTH < 24 HOURS Performed at [facility] [address], [place],  [address]    Report Status PENDING   Culture, blood (Routine X  2) w Reflex to ID Panel     Status: None (Preliminary result)   Collection Time: 04/03/19 12:35 PM   Specimen: BLOOD LEFT HAND  Result Value Ref Range   Specimen Description BLOOD LEFT HAND    Special Requests      BOTTLES DRAWN AEROBIC ONLY Blood Culture results may not be optimal due to an inadequate volume of blood received in culture bottles   Culture      NO GROWTH < 24 HOURS Performed at [facility] [address], [place], [place] [address]    Report Status PENDING   Renal function panel     Status: Abnormal   Collection Time: 04/03/19  4:00 PM  Result Value Ref Range   Sodium 133 (L) 135 - 145 mmol/L   Potassium 4.6 3.5 - 5.1 mmol/L   Chloride 100 98 - 111 mmol/L   CO2 22 22 - 32 mmol/L   Glucose, Bld 201 (H) 70 - 99 mg/dL   BUN 43 (H) 8 - 23 mg/dL   Creatinine, Ser 1.86 (H) 0.61 - 1.24 mg/dL   Calcium 7.6 (L) 8.9 - 10.3 mg/dL   Phosphorus 2.2 (L) 2.5 - 4.6 mg/dL   Albumin 1.8 (L) 3.5 - 5.0 g/dL   GFR calc non Af Amer 32 (L) >60 mL/min   GFR calc Af Amer 37 (L) >60 mL/min   Anion gap 11 5 - 15    Comment: Performed at [facility] [address], [place], [place] [address]  CBC     Status: Abnormal   Collection Time: 04/03/19  4:00 PM  Result Value Ref Range   WBC 36.1 (H) 4.0 - 10.5 K/uL   RBC 2.86 (L) 4.22 - 5.81 MIL/uL   Hemoglobin 8.4 (L) 13.0 - 17.0 g/dL   HCT 26.9 (L) 39.0 - 52.0 %   MCV 94.1 80.0 - 100.0 fL   MCH 29.4 26.0 - 34.0 pg   MCHC 31.2 30.0 - 36.0 g/dL   RDW 16.5 (H) 11.5 -  15.5 %   Platelets 127 (L) 150 - 400 K/uL    Comment: REPEATED TO VERIFY CONSISTENT WITH PREVIOUS RESULT    nRBC 0.6 (H) 0.0 - 0.2 %    Comment: Performed at [facility], [place] [address], [place], [place] [address]  Glucose, capillary     Status: Abnormal   Collection Time: 04/03/19  4:12 PM  Result Value Ref Range   Glucose-Capillary 198 (H) 70 - 99 mg/dL  Heparin level (unfractionated)     Status: Abnormal   Collection Time: 04/03/19  4:18 PM  Result Value Ref Range   Heparin Unfractionated 0.22 (L) 0.30 - 0.70 IU/mL    Comment: (NOTE) If heparin results are below expected values, and patient dosage has  been confirmed, suggest follow up testing of antithrombin III levels. Performed at [facility], [place] [address] [place], [place] [address]   Glucose, capillary     Status: Abnormal   Collection Time: 04/03/19  8:32 PM  Result Value Ref Range   Glucose-Capillary 150 (H) 70 - 99 mg/dL  Glucose, capillary     Status: Abnormal   Collection Time: 04/03/19 11:24 PM  Result Value Ref Range   Glucose-Capillary 146 (H) 70 - 99 mg/dL  CBC     Status: Abnormal   Collection Time: 04/04/19  3:55 AM  Result Value Ref Range   WBC 25.8 (H) 4.0 - 10.5 K/uL   RBC 2.31 (  L) 4.22 - 5.81 MIL/uL   Hemoglobin 6.8 (LL) 13.0 - 17.0 g/dL    Comment: REPEATED TO VERIFY THIS CRITICAL RESULT HAS VERIFIED AND BEEN CALLED TO S.MCAVINUE,RN BY MELISSA BROGDON ON 02 17 2021 AT 0437, AND HAS BEEN READ BACK.     HCT 21.9 (L) 39.0 - 52.0 %   MCV 94.8 80.0 - 100.0 fL   MCH 29.4 26.0 - 34.0 pg   MCHC 31.1 30.0 - 36.0 g/dL   RDW 16.9 (H) 11.5 - 15.5 %   Platelets 104 (L) 150 - 400 K/uL    Comment: REPEATED TO VERIFY PLATELET COUNT CONFIRMED BY SMEAR SPECIMEN CHECKED FOR CLOTS Immature Platelet Fraction may be clinically indicated, consider ordering this additional test [id number]    nRBC 0.5 (H) 0.0 - 0.2 %    Comment: Performed at [facility] [address] [place], [place]  [address]  LDH     Status: Abnormal   Collection Time: 04/04/19  3:55 AM  Result Value Ref Range   LDH 228 (H) 98 - 192 U/L    Comment: Performed at [facility] [address], [place], [place] [address]  Magnesium     Status: Abnormal   Collection Time: 04/04/19  3:55 AM  Result Value Ref Range   Magnesium 2.5 (H) 1.7 - 2.4 mg/dL    Comment: Performed at [facility] [address], [place], [place] [address]  APTT     Status: Abnormal   Collection Time: 04/04/19  3:55 AM  Result Value Ref Range   aPTT 99 (H) 24 - 36 seconds    Comment:        IF BASELINE aPTT IS ELEVATED, SUGGEST PATIENT RISK ASSESSMENT BE USED TO DETERMINE APPROPRIATE ANTICOAGULANT THERAPY. Performed at [facility], [place] [address] [place], [place] [address]   Heparin level (unfractionated)     Status: Abnormal   Collection Time: 04/04/19  3:55 AM  Result Value Ref Range   Heparin Unfractionated 0.13 (L) 0.30 - 0.70 IU/mL    Comment: (NOTE) If heparin results are below expected values, and patient dosage has  been confirmed, suggest follow up testing of antithrombin III levels. Performed at [facility], [place] [address], [place], [place] [address]   Comprehensive metabolic panel     Status: Abnormal   Collection Time: 04/04/19  3:55 AM  Result Value Ref Range   Sodium 133 (L) 135 - 145 mmol/L   Potassium 4.3 3.5 - 5.1 mmol/L   Chloride 98 98 - 111 mmol/L   CO2 22 22 - 32 mmol/L   Glucose, Bld 196 (H) 70 - 99 mg/dL   BUN 38 (H) 8 - 23 mg/dL   Creatinine, Ser 1.77 (H) 0.61 - 1.24 mg/dL   Calcium 7.5 (L) 8.9 - 10.3 mg/dL   Total Protein 5.5 (L) 6.5 - 8.1 g/dL   Albumin 2.2 (L) 3.5 - 5.0 g/dL   AST 39 15 - 41 U/L   ALT 25 0 - 44 U/L   Alkaline Phosphatase 73 38 - 126 U/L   Total Bilirubin 0.9 0.3 - 1.2 mg/dL   GFR calc non Af Amer 34 (L) >60 mL/min   GFR calc Af Amer 39 (L) >60 mL/min   Anion gap 13 5 - 15    Comment: Performed at [facility], [place] [address]  [place], [place] [address]  Cooxemetry Panel (carboxy, met, total hgb, O2 sat)  Status: Abnormal   Collection Time: 04/04/19  4:10 AM  Result Value Ref Range   Total hemoglobin 7.3 (L) 12.0 - 16.0 g/dL   O2 Saturation 72.3 %   Carboxyhemoglobin 1.8 (H) 0.5 - 1.5 %   Methemoglobin 1.1 0.0 - 1.5 %    Comment: Performed at [facility], [address] [place], [place] [address]  Glucose, capillary     Status: Abnormal   Collection Time: 04/04/19  4:14 AM  Result Value Ref Range   Glucose-Capillary 194 (H) 70 - 99 mg/dL  I-STAT 7, (LYTES, BLD GAS, ICA, H+H)     Status: Abnormal   Collection Time: 04/04/19  4:18 AM  Result Value Ref Range   pH, Arterial 7.444 7.350 - 7.450   pCO2 arterial 31.9 (L) 32.0 - 48.0 mmHg   pO2, Arterial 68.0 (L) 83.0 - 108.0 mmHg   Bicarbonate 22.0 20.0 - 28.0 mmol/L   TCO2 23 22 - 32 mmol/L   O2 Saturation 95.0 %   Acid-base deficit 2.0 0.0 - 2.0 mmol/L   Sodium 133 (L) 135 - 145 mmol/L   Potassium 4.2 3.5 - 5.1 mmol/L   Calcium, Ion 1.09 (L) 1.15 - 1.40 mmol/L   HCT 20.0 (L) 39.0 - 52.0 %   Hemoglobin 6.8 (LL) 13.0 - 17.0 g/dL   Patient temperature 97.8 F    Collection site FEMORAL ARTERY    Sample type ARTERIAL    Comment NOTIFIED PHYSICIAN   Type and screen [facility]     Status: None (Preliminary result)   Collection Time: 04/04/19  5:25 AM  Result Value Ref Range   ABO/RH(D) B POS    Antibody Screen NEG    Sample Expiration 04/07/2019,2359    Unit Number [id number]    Blood Component Type RED CELLS,LR    Unit division 00    Status of Unit ISSUED    Transfusion Status OK TO TRANSFUSE    Crossmatch Result Compatible    Unit Number [id number]    Blood Component Type RED CELLS,LR    Unit division 00    Status of Unit ISSUED    Transfusion Status OK TO TRANSFUSE    Crossmatch Result      Compatible Performed at [facility], [address] [place], [place] [address]   Prepare RBC     Status: None    Collection Time: 04/04/19  5:35 AM  Result Value Ref Range   Order Confirmation      ORDER PROCESSED BY BLOOD BANK Performed at [facility], [address] , [place] [address]   Prepare RBC     Status: None   Collection Time: 04/04/19  7:30 AM  Result Value Ref Range   Order Confirmation      ORDER PROCESSED BY BLOOD BANK BB SAMPLE OR UNITS ALREADY AVAILABLE Performed at [facility], [address] , [place] [address]   Glucose, capillary     Status: Abnormal   Collection Time: 04/04/19  8:33 AM  Result Value Ref Range   Glucose-Capillary 163 (H) 70 - 99 mg/dL  Glucose, capillary     Status: Abnormal   Collection Time: 04/04/19 12:09 PM  Result Value Ref Range   Glucose-Capillary 142 (H) 70 - 99 mg/dL   Recent Results (from the past 240 hour(s))  Culture, respiratory (non-expectorated)     Status: None   Collection Time: 03/26/19  6:29 AM   Specimen: Tracheal Aspirate;   Respiratory  Result Value Ref Range Status   Specimen Description TRACHEAL ASPIRATE  Final   Special Requests NONE  Final   Gram Stain   Final    ABUNDANT WBC PRESENT,BOTH PMN AND MONONUCLEAR NO ORGANISMS SEEN Performed at [facility], [address] [place], [place] [address]    Culture FEW PSEUDOMONAS AERUGINOSA  Final   Report Status 03/28/2019 FINAL  Final   Organism ID, Bacteria PSEUDOMONAS AERUGINOSA  Final      Susceptibility   Pseudomonas aeruginosa - MIC*    CEFTAZIDIME 8 SENSITIVE Sensitive     CIPROFLOXACIN <=0.25 SENSITIVE Sensitive     GENTAMICIN 4 SENSITIVE Sensitive     IMIPENEM 2 SENSITIVE Sensitive     * FEW PSEUDOMONAS AERUGINOSA  Culture, respiratory (non-expectorated)     Status: None (Preliminary result)   Collection Time: 04/03/19 11:08 AM   Specimen: Tracheal Aspirate; Respiratory  Result Value Ref Range Status   Specimen Description TRACHEAL ASPIRATE  Final   Special Requests NONE  Final   Gram Stain   Final    FEW WBC PRESENT,  PREDOMINANTLY PMN FEW GRAM NEGATIVE RODS RARE YEAST Performed at [facility], [place] [address] [place], [place] [address]    Culture FEW PSEUDOMONAS AERUGINOSA  Final   Report Status PENDING  Incomplete  Culture, blood (Routine X 2) w Reflex to ID Panel     Status: None (Preliminary result)   Collection Time: 04/03/19 12:30 PM   Specimen: BLOOD LEFT HAND  Result Value Ref Range Status   Specimen Description BLOOD LEFT HAND  Final   Special Requests   Final    BOTTLES DRAWN AEROBIC ONLY Blood Culture results may not be optimal due to an inadequate volume of blood received in culture bottles   Culture   Final    NO GROWTH < 24 HOURS Performed at [facility], [place] [address], [place], [place] [address]    Report Status PENDING  Incomplete  Culture, blood (Routine X 2) w Reflex to ID Panel     Status: None (Preliminary result)   Collection Time: 04/03/19 12:35 PM   Specimen: BLOOD LEFT HAND  Result Value Ref Range Status   Specimen Description BLOOD LEFT HAND  Final   Special Requests   Final    BOTTLES DRAWN AEROBIC ONLY Blood Culture results may not be optimal due to an inadequate volume of blood received in culture bottles   Culture   Final    NO GROWTH < 24 HOURS Performed at [facility], [place] [address] [place], [place] [address]    Report Status PENDING  Incomplete   Creatinine: Recent Labs    04/01/19 1844 04/02/19 0349 04/02/19 1612 04/02/19 2331 04/03/19 0548 04/03/19 1600 04/04/19 0355  CREATININE 1.79* 1.83* 1.75* 1.70* 1.78* 1.86* 1.77*    Xrays: See report/chart none  Impression/Assessment:  Changed 18 coude easily but irrigation was minimal Could not insert 22 coude 3 way due to distal meatal mild stenosis Dilated gently and sterile with male sounds 18-22 Fr Coude 22 went in well with some mild urethral resistance but easily thru prostate Irrigated moderated clots CBI working well   Plan:  follow  Event organiser A  Jesse Murphy 04/04/2019, 12:57 PM

## 2019-04-04 NOTE — Progress Notes (Addendum)
TCTS DAILY ICU PROGRESS NOTE                   [place]            [place] [address]          [phone]   8 Days Post-Op Procedure(s) (LRB): TRACHEOSTOMY placed using Shiley 8DCT Cuffed. (N/A) Placement Of Impella Left Ventricular Assist Device using ABIOMED Impella 5.5 with SmartAssist Device. (Right)  Total Length of Stay:  LOS: 21 days   Subjective:  transfused for more GI/Urinary tract blood loss Had some afib with RVR which he didn't tol well- now in sinus On trach collar Objective: Vital signs in last 24 hours: Temp:  [97.8 F (36.6 C)-98.7 F (37.1 C)] 97.8 F (36.6 C) (02/17 0706) Pulse Rate:  [73-135] 78 (02/17 0706) Cardiac Rhythm: Normal sinus rhythm (02/17 0400) Resp:  [15-38] 24 (02/17 0706) BP: (77-134)/(51-61) 116/54 (02/17 0330) SpO2:  [76 %-98 %] 95 % (02/17 0706) Arterial Line BP: (73-155)/(40-82) 129/49 (02/17 0706) FiO2 (%):  [40 %] 40 % (02/17 0651) Weight:  [91.4 kg] 91.4 kg (02/17 0500)  Filed Weights   04/02/19 0500 04/03/19 0500 04/04/19 0500  Weight: 97.9 kg 95.1 kg 91.4 kg    Weight change: -3.7 kg   Hemodynamic parameters for last 24 hours: CVP:  [0 mmHg-14 mmHg] 7 mmHg CO:  [7 L/min] 7 L/min CI:  [3.2 L/min/m2] 3.2 L/min/m2  Intake/Output from previous day: 02/16 0701 - 02/17 0700 In: 4812.4 [I.V.:2368.4; NG/GT:900; IV Piggyback:1186.1] Out: 3677 [Urine:25]  Intake/Output this shift: Total I/O In: 159.2 [I.V.:105.7; Other:13.5; NG/GT:40] Out: 125 [Other:125]  Current Meds: Scheduled Meds: . sodium chloride   Intravenous Once  . aspirin  81 mg Per Tube Daily  . atorvastatin  80 mg Per Tube q1800  . B-complex with vitamin C  1 tablet Oral Daily  . bisacodyl  10 mg Oral Daily   Or  . bisacodyl  10 mg Rectal Daily  . chlorhexidine gluconate (MEDLINE KIT)  15 mL Mouth Rinse BID  . Chlorhexidine Gluconate Cloth  6 each Topical Daily  . docusate  200 mg Per Tube Daily  . feeding supplement (PRO-STAT SUGAR  FREE 64)  60 mL Per Tube TID  . Gerhardt's butt cream   Topical BID  . insulin aspart  0-24 Units Subcutaneous Q4H  . insulin aspart  5 Units Subcutaneous Q4H  . insulin glargine  26 Units Subcutaneous Daily  . mouth rinse  15 mL Mouth Rinse 10 times per day  . midodrine  10 mg Oral TID WC  . pantoprazole (PROTONIX) IV  40 mg Intravenous Q24H  . sodium chloride flush  10-40 mL Intracatheter Q12H  . tamsulosin  0.4 mg Oral Daily   Continuous Infusions: .  prismasol BGK 4/2.5 400 mL/hr at 04/04/19 0823  .  prismasol BGK 4/2.5 200 mL/hr at 04/04/19 0116  . sodium chloride    . sodium chloride 20 mL/hr at 04/04/19 0800  . sodium chloride 10 mL/hr at 03/29/19 2128  . albumin human Stopped (04/04/19 0517)  . amiodarone 60 mg/hr (04/04/19 0800)  . feeding supplement (VITAL 1.5 CAL) 1,000 mL (04/02/19 0813)  . impella catheter heparin 25 unit/mL in dextrose 5%    . norepinephrine (LEVOPHED) Adult infusion 26 mcg/min (04/04/19 0800)  . prismasol BGK 4/2.5 1,500 mL/hr at 04/04/19 0736  . vancomycin Stopped (04/03/19 2059)  . vasopressin (PITRESSIN) infusion - *FOR SHOCK* 0.04 Units/min (04/04/19 0800)  PRN Meds:.sodium chloride, oxyCODONE **AND** acetaminophen, dextrose, docusate, fentaNYL (SUBLIMAZE) injection, heparin, heparin, midazolam, ondansetron (ZOFRAN) IV, sodium chloride flush, sodium chloride flush  General appearance: alert, cooperative and no distress Heart: regular rate and rhythm Lungs: coarse Abdomen: soft, non- distended Extremities: + edema- slowly improving Wound: incis healing well  Lab Results: CBC: Recent Labs    04/03/19 1600 04/03/19 1600 04/04/19 0355 04/04/19 0418  WBC 36.1*  --  25.8*  --   HGB 8.4*   < > 6.8* 6.8*  HCT 26.9*   < > 21.9* 20.0*  PLT 127*  --  104*  --    < > = values in this interval not displayed.   BMET:  Recent Labs    04/03/19 1600 04/03/19 1600 04/04/19 0355 04/04/19 0418  NA 133*   < > 133* 133*  K 4.6   < > 4.3 4.2  CL  100  --  98  --   CO2 22  --  22  --   GLUCOSE 201*  --  196*  --   BUN 43*  --  38*  --   CREATININE 1.86*  --  1.77*  --   CALCIUM 7.6*  --  7.5*  --    < > = values in this interval not displayed.    CMET: Lab Results  Component Value Date   WBC 25.8 (H) 04/04/2019   HGB 6.8 (LL) 04/04/2019   HCT 20.0 (L) 04/04/2019   PLT 104 (L) 04/04/2019   GLUCOSE 196 (H) 04/04/2019   CHOL 117 03/16/2019   TRIG 52 03/16/2019   HDL 33 (L) 03/16/2019   LDLCALC 74 03/16/2019   ALT 25 04/04/2019   AST 39 04/04/2019   NA 133 (L) 04/04/2019   K 4.2 04/04/2019   CL 98 04/04/2019   CREATININE 1.77 (H) 04/04/2019   BUN 38 (H) 04/04/2019   CO2 22 04/04/2019   TSH 3.683 03/13/2019   INR 1.7 (H) 03/27/2019   HGBA1C 6.1 (H) 03/13/2019      PT/INR: No results for input(s): LABPROT, INR in the last 72 hours. Radiology: No results found.   Assessment/Plan: S/P Procedure(s) (LRB): TRACHEOSTOMY placed using Shiley 8DCT Cuffed. (N/A) Placement Of Impella Left Ventricular Assist Device using ABIOMED Impella 5.5 with SmartAssist Device. (Right)  1 back in sinus- does not tol afib well 2 stable hemodyn stable on impella, trying to wean levo and vasopressin, hopefully wean impella some too 3 sats good on TC currently, minor hypoxemia PCO2 32- per CCM 4 getting transfused this am 5 CRRT pulling even currently- nephrology managing 6 leukocytosis improving- on [facility] 04/04/2019 8:35 AM  Pager [phone]  We will need to hold heparin for 24 hours because of ongoing GI bleeding requiring transfusion.  Purge heparin in Impella catheter only.  CVVH may have to be discontinued.  Patient breathing on trach collar trials. Remains responsive and mentally intact. White count remains elevated but afebrile on Fortaz for Pseudomonas from BAL. Persistent bloody urine through catheter irrigation low  Continue current care.  All questions addressed from patient and son at bedside.

## 2019-04-04 NOTE — Progress Notes (Signed)
CT surgery p.m. Rounds  Patient had stable day, tolerated trach collar for over 2 hours now resting on ventilator.  Maintaining sinus rhythm today CVVH removing 50-100 cc/h Three-way Foley now irrigating bladder with minimal bloody drainage Peripheral heparin on hold today for GI and urinary tract bleeding requiring multiple transfusions yesterday Cardiac output on Flowtrack 5 L/min Blood pressure (!) 127/46, pulse 75, temperature 98 F (36.7 C), temperature source Oral, resp. rate (!) 26, height 5\' 8"  (1.727 m), weight 91.4 kg, SpO2 97 %.

## 2019-04-04 NOTE — Progress Notes (Signed)
NAME:  Jesse Murphy, MRN:  [id number], DOB:  07-15-1928, LOS: 21 ADMISSION DATE:  03/13/2019, CONSULTATION DATE:  2/2 REFERRING MD:  Nils Pyle, CHIEF COMPLAINT:  Dyspnea   Brief History   86 y/o male admitted 1/26, underwent CABG and L atrial appendage clipping on 1/29, moved to ICU on 2/1 for intubation.  Past Medical History  A fib, CAD, Prostate cancer, HTN  Significant Hospital Events   1/29 CABG 2/01 to ICU 2/05 Extubated 2/08 Reintubated 2/09 tracheostomy 2/09 Impella, PA catheter placed 2/11 CRRT initiated  Consults:  Nephrology CHF team  Procedures:  Rt PICC 2/02 >> Trach 2/09 >> PA catheter 2/09 >> Lt Boley HD cath 2/11 >> L Rad Aline 2/3 >>  Impella 2/10 >>  Significant Diagnostic Tests:  Echo 1/26 >> EF 30 to 35%  Micro Data:  SARS CoV2 PCR 1/26 >> negative Influenza PCR 1/26 >> negative Sputum 2/01 >> oral flora Sputum 2/08 >> Pseudomonas >> pan sensitive   Antimicrobials:  Rocephin 2/01 >> 2/07 Cefepime 2/08 >> 2/10 Tressie Ellis 2/10 >>   Interim history/subjective:  Episode of rapid atrial fibrillation   Objective   Blood pressure (!) 116/54, pulse 75, temperature 98.3 F (36.8 C), temperature source Axillary, resp. rate 17, height 5\' 8"  (1.727 m), weight 91.4 kg, SpO2 99 %. CVP:  [0 mmHg-14 mmHg] 10 mmHg CO:  [7 L/min] 7 L/min CI:  [3.2 L/min/m2] 3.2 L/min/m2  Vent Mode: PRVC FiO2 (%):  [40 %] 40 % Set Rate:  [18 bmp] 18 bmp Vt Set:  [540 mL] 540 mL PEEP:  [5 cmH20] 5 cmH20 Pressure Support:  [5 cmH20] 5 cmH20   Intake/Output Summary (Last 24 hours) at 04/04/2019 1144 Last data filed at 04/04/2019 1100 Gross per 24 hour  Intake 5228.92 ml  Output 2915 ml  Net 2313.92 ml   Filed Weights   04/02/19 0500 04/03/19 0500 04/04/19 0500  Weight: 97.9 kg 95.1 kg 91.4 kg    Examination: General: elderly male lying in bed, critically ill appearing but in NAD HEENT: MM pink/moist, ETT, L Freeman HD cath Neuro: AAOx4, interactive / appropriate,  generalized weakness but moves all extremities  CV: s1s2 RRR, no m/r/g PULM:  Non-labored on vent, lungs bilaterally clear anterior, diminished bases GI: soft, bsx4 active  Extremities: warm/dry, trace BUE edema  Skin: no rashes or lesions, R Impella catheter c/d/i, RUE PICC   Resolved Hospital Problem list     Assessment & Plan:   Acute Hypoxemic Respiratory Failure due to Acute pulmonary edema complicated by Pseudomonas HCAP Respiratory muscle weakness may be driving much of his respiratory failure. - Trach collar trials BID for 2h each session. Increase as tolerated.   -  Duoneb   Cardiogenic Shock with acute systolic heart failure - Impella per CVTS / CHF service  -Continue levophed, vasopressin  - wean to keep SBP >120 initially. May consider lower target if perfusion still appears adequate. - Start with NE as may be driving AF.  Atrial fibrillation In SR, bleeding on heparin (rectal, foley) - Continue amiodarone iv  -heparin gtt per pharmacy, monitor for further bleeding   AKI secondary to Cardiogenic Shock - appears to be intravascularly euvolemic, improving edema. - Continue to UF  Anemia of critical illness Melena, Bloody Urine  PRBC x2 2/14 -follow closely with hematuria, melena -heparin gtt per pharmacy  -ASA  -transfuse for Hgb <7%  DM2 with hyperglycemia, poorly controlled -SSI -increase lantus to 26 units QD -increase TF coverage to 5  units Q4   Acute metabolic encephalopathy due to shock, renal failure, hypoxemia -RASS Goal 0  -minimize all sedating medications as able  -delirium prevention measures -PT efforts  Pressure wounds / Reported as Skin Tear 2/15 -WOC following   Leukocytosis  -note rising trend, multiple potential sources  -continue abx as above  Best practice:  Diet: TF Pain/Anxiety/Delirium protocol (if indicated): PAD protocol VAP protocol (if indicated): yes DVT prophylaxis: heparin infusion GI prophylaxis: PPI Glucose  control: SSI, glargine Mobility: bed rest Code Status: full Family Communication: per Primary service Disposition: ICU  Labs   CBC: Recent Labs  Lab 04/02/19 1242 04/02/19 1242 04/02/19 1612 04/02/19 2331 04/03/19 0406 04/03/19 0501 04/03/19 1600 04/04/19 0355 04/04/19 0418  WBC 35.0*  --  33.3*  --  32.3*  --  36.1* 25.8*  --   HGB 9.9*   < > 9.4*   < > 8.9* 8.5* 8.4* 6.8* 6.8*  HCT 31.4*   < > 29.4*   < > 28.2* 25.0* 26.9* 21.9* 20.0*  MCV 93.2  --  90.7  --  92.2  --  94.1 94.8  --   PLT 146*  --  135*  --  134*  --  127* 104*  --    < > = values in this interval not displayed.    Basic Metabolic Panel: Recent Labs  Lab 03/30/19 1510 03/30/19 1510 03/31/19 0408 03/31/19 0408 03/31/19 1510 03/31/19 1510 04/01/19 0400 04/01/19 0455 04/01/19 1844 04/01/19 1844 04/02/19 0349 04/02/19 0450 04/02/19 1612 04/02/19 1612 04/02/19 2331 04/02/19 2331 04/03/19 0406 04/03/19 0501 04/03/19 0548 04/03/19 1600 04/04/19 0355 04/04/19 0418  NA 132*   < > 133*  134*   < > 134*   < > 134*   < > 134*   < > 136   < > 134*   < > 134*   < >  --  136 133* 133* 133* 133*  K 4.1   < > 4.3  4.4   < > 4.1   < > 4.5   < > 4.4   < > 4.3   < > 4.6   < > 4.2   < >  --  4.2 4.2 4.6 4.3 4.2  CL 97*   < > 99   < > 100   < > 100   < > 102   < > 101   < > 99  --  99  --   --   --  97* 100 98  --   CO2 23   < > 23   < > 22   < > 24   < > 22   < > 25  --  23  --   --   --   --   --  25 22 22   --   GLUCOSE 151*   < > 182*   < > 194*   < > 187*   < > 200*   < > 188*   < > 205*  --  166*  --   --   --  253* 201* 196*  --   BUN 97*   < > 70*   < > 58*   < > 49*   < > 47*   < > 43*   < > 44*  --  40*  --   --   --  40* 43* 38*  --   CREATININE 2.73*   < >  2.32*   < > 2.03*   < > 1.89*   < > 1.79*   < > 1.83*   < > 1.75*  --  1.70*  --   --   --  1.78* 1.86* 1.77*  --   CALCIUM 7.4*   < > 7.8*   < > 7.7*   < > 7.9*   < > 7.6*   < > 7.9*  --  7.5*  --   --   --   --   --  7.6* 7.6* 7.5*  --   MG   --   --  2.5*  --   --   --  2.5*  --   --   --  2.5*  --   --   --   --   --  2.5*  --   --   --  2.5*  --   PHOS 4.6  --   --   --  3.3  --   --   --  2.3*  --   --   --  1.9*  --   --   --   --   --   --  2.2*  --   --    < > = values in this interval not displayed.   GFR: Estimated Creatinine Clearance: 31.7 mL/min (A) (by C-G formula based on SCr of 1.77 mg/dL (H)). Recent Labs  Lab 04/02/19 1612 04/03/19 0406 04/03/19 1600 04/04/19 0355  WBC 33.3* 32.3* 36.1* 25.8*    Liver Function Tests: Recent Labs  Lab 03/31/19 0408 03/31/19 1510 04/01/19 0400 04/01/19 1844 04/02/19 0349 04/02/19 1612 04/03/19 0548 04/03/19 1600 04/04/19 0355  AST 47*  --  35  --  35  --  33  --  39  ALT 24  --  20  --  20  --  24  --  25  ALKPHOS 65  --  66  --  74  --  76  --  73  BILITOT 0.7  --  0.6  --  0.9  --  0.9  --  0.9  PROT 5.7*  --  5.5*  --  5.5*  --  5.3*  --  5.5*  ALBUMIN 2.0*   < > 2.0*   < > 2.0* 2.0* 1.9* 1.8* 2.2*   < > = values in this interval not displayed.   No results for input(s): LIPASE, AMYLASE in the last 168 hours. No results for input(s): AMMONIA in the last 168 hours.  ABG    Component Value Date/Time   PHART 7.444 04/04/2019 0418   PCO2ART 31.9 (L) 04/04/2019 0418   PO2ART 68.0 (L) 04/04/2019 0418   HCO3 22.0 04/04/2019 0418   TCO2 23 04/04/2019 0418   ACIDBASEDEF 2.0 04/04/2019 0418   O2SAT 95.0 04/04/2019 0418     Coagulation Profile: No results for input(s): INR, PROTIME in the last 168 hours.  Cardiac Enzymes: No results for input(s): CKTOTAL, CKMB, CKMBINDEX, TROPONINI in the last 168 hours.  HbA1C: Hgb A1c MFr Bld  Date/Time Value Ref Range Status  03/13/2019 12:51 PM 6.1 (H) 4.8 - 5.6 % Final    Comment:    (NOTE) Pre diabetes:          5.7%-6.4% Diabetes:              >6.4% Glycemic control for   <7.0% adults with diabetes   09/12/2018 09:36 AM 5.9 (H) 4.8 - 5.6 %  Final    Comment:    (NOTE) Pre diabetes:           5.7%-6.4% Diabetes:              >6.4% Glycemic control for   <7.0% adults with diabetes     CBG: Recent Labs  Lab 04/03/19 1612 04/03/19 2032 04/03/19 2324 04/04/19 0414 04/04/19 0833  GLUCAP 198* 150* 146* 194* 163*    CRITICAL CARE Performed by: Kipp Brood   Total critical care time: 40 minutes  Critical care time was exclusive of separately billable procedures and treating other patients.  Critical care was necessary to treat or prevent imminent or life-threatening deterioration.  Critical care was time spent personally by me on the following activities: development of treatment plan with patient and/or surrogate as well as nursing, discussions with consultants, evaluation of patient's response to treatment, examination of patient, obtaining history from patient or surrogate, ordering and performing treatments and interventions, ordering and review of laboratory studies, ordering and review of radiographic studies, pulse oximetry, re-evaluation of patient's condition and participation in multidisciplinary rounds.  Kipp Brood, MD [facility] ICU Physician [facility]  Pager: [phone] Mobile: [phone] After hours: [phone].

## 2019-04-04 NOTE — Progress Notes (Signed)
Letting pt rest for the night, attempt trach collar tomorrow morning.

## 2019-04-04 NOTE — Progress Notes (Signed)
Pharmacy Antibiotic Note  XZAVIOR REINIG is a 86 y.o. male admitted on 03/13/2019 with sepsis.  Pharmacy has been consulted for vancomycin dosing.  Started on cefepime>ceftazidime since 2/8 for pseudomonas PNA. WBC increasing 32, afebrile. Remains on CRRT- Scr 1.78. On increasing amount of pressor support overnight.   Vancomycin random is therapeutic at 15 mcg/ml.  Plan: -Vancomycin 1000mg  IV q24h   Height: 5\' 8"  (172.7 cm) Weight: 201 lb 8 oz (91.4 kg) IBW/kg (Calculated) : 68.4  Temp (24hrs), Avg:98.1 F (36.7 C), Min:97.8 F (36.6 C), Max:98.4 F (36.9 C)  Recent Labs  Lab 04/02/19 1612 04/02/19 1612 04/02/19 2331 04/03/19 0406 04/03/19 0548 04/03/19 1600 04/04/19 0355 04/04/19 1457  WBC 33.3*  --   --  32.3*  --  36.1* 25.8* 17.8*  CREATININE 1.75*   < > 1.70*  --  1.78* 1.86* 1.77* 1.76*  VANCORANDOM  --   --   --   --   --   --   --  15   < > = values in this interval not displayed.    Estimated Creatinine Clearance: 31.8 mL/min (A) (by C-G formula based on SCr of 1.76 mg/dL (H)).    No Known Allergies  Antimicrobials this admission: CTX 2/1 >> 2/7 Cefepime 2/8 >> 2/10 Fortaz 2/10 >> (2/17) Vanc 2/16>>   Dose adjustments this admission: N/A  Microbiology results: 2/8 resp cx - Pseudomonas- R - cefepime (not included on report, S fortaz,cipro, gent, primaxin) 2/1 TA: normal flora 1/28 MRSA: neg 1/26 COVID/flu: neg    Arrie Senate, PharmD, BCPS Clinical Pharmacist [phone] Please check AMION for all [facility] numbers 04/04/2019

## 2019-04-04 NOTE — Progress Notes (Signed)
A-line dressing was changed. Slight redness around catheter insertion site. RN was made aware.

## 2019-04-04 NOTE — Consult Note (Signed)
Urology Consult  Referring physician: R Agarwala Reason for referral: clot retention   Chief Complaint: clot retention  History of Present Illness: clot retention; reviewed chart; NP placed 18 fr coude for retention/cath not draining/unable to place larger 3 way No blood thinner Previous prostate radiation Low output scanned for 270 bloody  Past Medical History:  Diagnosis Date  . Atrial fibrillation ([place])   . CAD (coronary artery disease)    a. s/p PCI in 1992 b. Coronary CT in 01/2018 showing extensive coronary calcification; FFR indeterminate --> medical management pursued at that time as patient not interested in repeat cath  . Cancer [facility])    prostate  . Dyspnea   . Hypertension    Past Surgical History:  Procedure Laterality Date  . CLIPPING OF ATRIAL APPENDAGE N/A 03/16/2019   Procedure: CLIPPING OF LEFT  ATRIAL APPENDAGE - USING ATRICLIP SIZE 40;  Surgeon: Ivin Poot, MD;  Location: [facility];  Service: Open Heart Surgery;  Laterality: N/A;  . COLONOSCOPY N/A 08/25/2017   Procedure: COLONOSCOPY;  Surgeon: Rogene Houston, MD;  [facility];  Service: Endoscopy;  Laterality: N/A;  . CORONARY ARTERY BYPASS GRAFT N/A 03/16/2019   Procedure: CORONARY ARTERY BYPASS GRAFTING (CABG), ON PUMP, TIMES THREE, USING LEFT INTERNAL MAMMARY ARTERY, RIGHT GREATER SAPHENOUS VEIN HARVESTED ENDOSCOPICALLY;  Surgeon: Ivin Poot, MD;  Location: [place];  Service: Open Heart Surgery;  Laterality: N/A;  swan only  . HERNIA REPAIR    . MAZE N/A 03/16/2019   Procedure: MAZE;  Surgeon: Ivin Poot, MD;  Location: [place];  Service: Open Heart Surgery;  Laterality: N/A;  . PLACEMENT OF IMPELLA LEFT VENTRICULAR ASSIST DEVICE Right 03/27/2019   Procedure: Placement Of Impella Left Ventricular Assist Device using ABIOMED Impella 5.5 with SmartAssist Device.;  Surgeon: Wonda [place], MD;  [facility];  Service: Thoracic;  Laterality: Right;  . PROSTATE SURGERY    . RIGHT/LEFT HEART  CATH AND CORONARY ANGIOGRAPHY N/A 03/15/2019   Procedure: RIGHT/LEFT HEART CATH AND CORONARY ANGIOGRAPHY;  Surgeon: Burnell Blanks, MD;  Location: [place] [facility];  Service: Cardiovascular;  Laterality: N/A;  . TEE WITHOUT CARDIOVERSION N/A 03/16/2019   Procedure: TRANSESOPHAGEAL ECHOCARDIOGRAM (TEE);  Surgeon: Prescott Gum, Collier Salina, MD;  Location: [place];  Service: Open Heart Surgery;  Laterality: N/A;  . TRACHEOSTOMY TUBE PLACEMENT N/A 03/27/2019   Procedure: TRACHEOSTOMY placed using Shiley 8DCT Cuffed.;  Surgeon: Wonda Olds, MD;  [facility];  Service: Thoracic;  Laterality: N/A;    Medications: I have reviewed the patient's current medications. Allergies: No Known Allergies  Family History  Problem Relation Age of Onset  . CAD Mother 69  . CAD Father 74  . CAD Sister   . CAD Brother    Social History:  reports that he quit smoking about 29 years ago. His smoking use included cigarettes. He has a 12.50 pack-year smoking history. He has never used smokeless tobacco. He reports that he does not drink alcohol or use drugs.  ROS: All systems are reviewed and negative except as noted. Negative limited  Physical Exam:  Vital signs in last 24 hours: Temp:  [97.8 F (36.6 C)-98.4 F (36.9 C)] 98.3 F (36.8 C) (02/17 1045) Pulse Rate:  [73-135] 75 (02/17 1000) Resp:  [14-33] 17 (02/17 1045) BP: (77-131)/(51-61) 116/54 (02/17 0330) SpO2:  [76 %-99 %] 99 % (02/17 1045) Arterial Line BP: (73-155)/(40-82) 125/46 (02/17 1045) FiO2 (%):  [40 %] 40 % (02/17 1012)  Weight:  [91.4 kg] 91.4 kg (02/17 0500)  Cardiovascular: Skin warm; not flushed Respiratory: Breaths quiet; no shortness of breath Abdomen: No masses Neurological: Normal sensation to touch Musculoskeletal: Normal motor function arms and legs Lymphatics: No inguinal adenopathy Skin: No rashes Genitourinary:18 not draining blood in bag   Laboratory Data:  Results for orders placed or performed during the  hospital encounter of 03/13/19 (from the past 72 hour(s))  Glucose, capillary     Status: Abnormal   Collection Time: 04/01/19  5:41 PM  Result Value Ref Range   Glucose-Capillary 185 (H) 70 - 99 mg/dL  Renal function panel     Status: Abnormal   Collection Time: 04/01/19  6:44 PM  Result Value Ref Range   Sodium 134 (L) 135 - 145 mmol/L   Potassium 4.4 3.5 - 5.1 mmol/L   Chloride 102 98 - 111 mmol/L   CO2 22 22 - 32 mmol/L   Glucose, Bld 200 (H) 70 - 99 mg/dL   BUN 47 (H) 8 - 23 mg/dL   Creatinine, Ser 1.79 (H) 0.61 - 1.24 mg/dL   Calcium 7.6 (L) 8.9 - 10.3 mg/dL   Phosphorus 2.3 (L) 2.5 - 4.6 mg/dL   Albumin 2.0 (L) 3.5 - 5.0 g/dL   GFR calc non Af Amer 33 (L) >60 mL/min   GFR calc Af Amer 38 (L) >60 mL/min   Anion gap 10 5 - 15    Comment: Performed at [facility], [address] [place], [place] [address]  Heparin level (unfractionated)     Status: Abnormal   Collection Time: 04/01/19  6:44 PM  Result Value Ref Range   Heparin Unfractionated <0.10 (L) 0.30 - 0.70 IU/mL    Comment: REPEATED TO VERIFY (NOTE) If heparin results are below expected values, and patient dosage has  been confirmed, suggest follow up testing of antithrombin III levels. Performed at [facility], [place] [address] [place], [place] [address]   POCT Activated clotting time     Status: None   Collection Time: 04/01/19  7:03 PM  Result Value Ref Range   Activated Clotting Time 131 seconds  CBC     Status: Abnormal   Collection Time: 04/01/19  8:04 PM  Result Value Ref Range   WBC 24.9 (H) 4.0 - 10.5 K/uL   RBC 3.52 (L) 4.22 - 5.81 MIL/uL   Hemoglobin 10.2 (L) 13.0 - 17.0 g/dL    Comment: REPEATED TO VERIFY POST TRANSFUSION SPECIMEN    HCT 31.5 (L) 39.0 - 52.0 %   MCV 89.5 80.0 - 100.0 fL   MCH 29.0 26.0 - 34.0 pg   MCHC 32.4 30.0 - 36.0 g/dL   RDW 15.3 11.5 - 15.5 %   Platelets 169 150 - 400 K/uL    Comment: REPEATED TO VERIFY   nRBC 0.2 0.0 - 0.2 %    Comment: Performed at  [facility], [place] [address], [place], [place] [address]  Glucose, capillary     Status: Abnormal   Collection Time: 04/01/19  8:12 PM  Result Value Ref Range   Glucose-Capillary 170 (H) 70 - 99 mg/dL  POCT Activated clotting time     Status: None   Collection Time: 04/01/19  8:28 PM  Result Value Ref Range   Activated Clotting Time 131 seconds  POCT Activated clotting time     Status: None   Collection Time: 04/01/19 10:07 PM  Result Value Ref Range   Activated Clotting Time  147 seconds  POCT Activated clotting time     Status: None   Collection Time: 04/01/19 11:12 PM  Result Value Ref Range   Activated Clotting Time 153 seconds  Glucose, capillary     Status: Abnormal   Collection Time: 04/02/19 12:11 AM  Result Value Ref Range   Glucose-Capillary 173 (H) 70 - 99 mg/dL  POCT Activated clotting time     Status: None   Collection Time: 04/02/19 12:23 AM  Result Value Ref Range   Activated Clotting Time 169 seconds  POCT Activated clotting time     Status: None   Collection Time: 04/02/19  2:07 AM  Result Value Ref Range   Activated Clotting Time 175 seconds  POCT Activated clotting time     Status: None   Collection Time: 04/02/19  3:12 AM  Result Value Ref Range   Activated Clotting Time 180 seconds  Cooxemetry Panel (carboxy, met, total hgb, O2 sat)     Status: Abnormal   Collection Time: 04/02/19  3:49 AM  Result Value Ref Range   Total hemoglobin 11.3 (L) 12.0 - 16.0 g/dL   O2 Saturation 71.0 %   Carboxyhemoglobin 1.4 0.5 - 1.5 %   Methemoglobin 0.8 0.0 - 1.5 %    Comment: Performed at [facility], [place] [address], [place], [place] [address]  LDH     Status: Abnormal   Collection Time: 04/02/19  3:49 AM  Result Value Ref Range   LDH 247 (H) 98 - 192 U/L    Comment: Performed at [facility] [address] [place], [place] [address]  CBC     Status: Abnormal   Collection Time: 04/02/19  3:49 AM  Result Value Ref Range   WBC 30.6 (H) 4.0 - 10.5  K/uL   RBC 3.36 (L) 4.22 - 5.81 MIL/uL   Hemoglobin 10.0 (L) 13.0 - 17.0 g/dL   HCT 30.4 (L) 39.0 - 52.0 %   MCV 90.5 80.0 - 100.0 fL   MCH 29.8 26.0 - 34.0 pg   MCHC 32.9 30.0 - 36.0 g/dL   RDW 15.7 (H) 11.5 - 15.5 %   Platelets 158 150 - 400 K/uL   nRBC 0.3 (H) 0.0 - 0.2 %    Comment: Performed at [facility] [address], [place], [place] [address]  Comprehensive metabolic panel     Status: Abnormal   Collection Time: 04/02/19  3:49 AM  Result Value Ref Range   Sodium 136 135 - 145 mmol/L   Potassium 4.3 3.5 - 5.1 mmol/L   Chloride 101 98 - 111 mmol/L   CO2 25 22 - 32 mmol/L   Glucose, Bld 188 (H) 70 - 99 mg/dL   BUN 43 (H) 8 - 23 mg/dL   Creatinine, Ser 1.83 (H) 0.61 - 1.24 mg/dL   Calcium 7.9 (L) 8.9 - 10.3 mg/dL   Total Protein 5.5 (L) 6.5 - 8.1 g/dL   Albumin 2.0 (L) 3.5 - 5.0 g/dL   AST 35 15 - 41 U/L   ALT 20 0 - 44 U/L   Alkaline Phosphatase 74 38 - 126 U/L   Total Bilirubin 0.9 0.3 - 1.2 mg/dL   GFR calc non Af Amer 32 (L) >60 mL/min   GFR calc Af Amer 37 (L) >60 mL/min   Anion gap 10 5 - 15    Comment: Performed at [facility], [place] [address], [place], [place] [address]  Heparin level (unfractionated)  Status: None   Collection Time: 04/02/19  3:49 AM  Result Value Ref Range   Heparin Unfractionated 0.35 0.30 - 0.70 IU/mL    Comment: (NOTE) If heparin results are below expected values, and patient dosage has  been confirmed, suggest follow up testing of antithrombin III levels. Performed at [facility], [place] [address], [place], [place] [address]   Magnesium     Status: Abnormal   Collection Time: 04/02/19  3:49 AM  Result Value Ref Range   Magnesium 2.5 (H) 1.7 - 2.4 mg/dL    Comment: Performed at [facility] [address], [place], [place] [address]  APTT     Status: Abnormal   Collection Time: 04/02/19  3:49 AM  Result Value Ref Range   aPTT 156 (H) 24 - 36 seconds    Comment:        IF BASELINE aPTT IS  ELEVATED, SUGGEST PATIENT RISK ASSESSMENT BE USED TO DETERMINE APPROPRIATE ANTICOAGULANT THERAPY. Performed at [facility], [place] [address] [place], [place] [address]   Glucose, capillary     Status: Abnormal   Collection Time: 04/02/19  3:54 AM  Result Value Ref Range   Glucose-Capillary 170 (H) 70 - 99 mg/dL  POCT Activated clotting time     Status: None   Collection Time: 04/02/19  4:15 AM  Result Value Ref Range   Activated Clotting Time 180 seconds  I-STAT 7, (LYTES, BLD GAS, ICA, H+H)     Status: Abnormal   Collection Time: 04/02/19  4:50 AM  Result Value Ref Range   pH, Arterial 7.426 7.350 - 7.450   pCO2 arterial 40.3 32.0 - 48.0 mmHg   pO2, Arterial 66.0 (L) 83.0 - 108.0 mmHg   Bicarbonate 26.6 20.0 - 28.0 mmol/L   TCO2 28 22 - 32 mmol/L   O2 Saturation 93.0 %   Acid-Base Excess 2.0 0.0 - 2.0 mmol/L   Sodium 134 (L) 135 - 145 mmol/L   Potassium 4.7 3.5 - 5.1 mmol/L   Calcium, Ion 1.11 (L) 1.15 - 1.40 mmol/L   HCT 30.0 (L) 39.0 - 52.0 %   Hemoglobin 10.2 (L) 13.0 - 17.0 g/dL   Patient temperature 98.2 F    Sample type ARTERIAL   POCT Activated clotting time     Status: None   Collection Time: 04/02/19  5:24 AM  Result Value Ref Range   Activated Clotting Time 186 seconds  POCT Activated clotting time     Status: None   Collection Time: 04/02/19  6:32 AM  Result Value Ref Range   Activated Clotting Time 186 seconds  Glucose, capillary     Status: Abnormal   Collection Time: 04/02/19  7:25 AM  Result Value Ref Range   Glucose-Capillary 192 (H) 70 - 99 mg/dL  POCT Activated clotting time     Status: None   Collection Time: 04/02/19  7:58 AM  Result Value Ref Range   Activated Clotting Time 197 seconds  Glucose, capillary     Status: Abnormal   Collection Time: 04/02/19 11:13 AM  Result Value Ref Range   Glucose-Capillary 173 (H) 70 - 99 mg/dL   Comment 1 Glucose Stabilizer    Comment 2 Procedure Error   CBC     Status: Abnormal   Collection Time:  04/02/19 12:42 PM  Result Value Ref Range   WBC 35.0 (H) 4.0 - 10.5 K/uL   RBC 3.37 (L) 4.22 - 5.81 MIL/uL   Hemoglobin 9.9 (  L) 13.0 - 17.0 g/dL   HCT 31.4 (L) 39.0 - 52.0 %   MCV 93.2 80.0 - 100.0 fL   MCH 29.4 26.0 - 34.0 pg   MCHC 31.5 30.0 - 36.0 g/dL   RDW 16.1 (H) 11.5 - 15.5 %   Platelets 146 (L) 150 - 400 K/uL   nRBC 0.5 (H) 0.0 - 0.2 %    Comment: Performed at [facility] [address] [place], [place] [address]  Heparin level (unfractionated)     Status: None   Collection Time: 04/02/19  1:48 PM  Result Value Ref Range   Heparin Unfractionated 0.42 0.30 - 0.70 IU/mL    Comment: (NOTE) If heparin results are below expected values, and patient dosage has  been confirmed, suggest follow up testing of antithrombin III levels. Performed at [facility], [place] [address], [place],  [address]   Renal function panel     Status: Abnormal   Collection Time: 04/02/19  4:12 PM  Result Value Ref Range   Sodium 134 (L) 135 - 145 mmol/L   Potassium 4.6 3.5 - 5.1 mmol/L   Chloride 99 98 - 111 mmol/L   CO2 23 22 - 32 mmol/L   Glucose, Bld 205 (H) 70 - 99 mg/dL   BUN 44 (H) 8 - 23 mg/dL   Creatinine, Ser 1.75 (H) 0.61 - 1.24 mg/dL   Calcium 7.5 (L) 8.9 - 10.3 mg/dL   Phosphorus 1.9 (L) 2.5 - 4.6 mg/dL   Albumin 2.0 (L) 3.5 - 5.0 g/dL   GFR calc non Af Amer 34 (L) >60 mL/min   GFR calc Af Amer 39 (L) >60 mL/min   Anion gap 12 5 - 15    Comment: Performed at [facility] [address] [place], [place] [address]  CBC     Status: Abnormal   Collection Time: 04/02/19  4:12 PM  Result Value Ref Range   WBC 33.3 (H) 4.0 - 10.5 K/uL    Comment: REPEATED TO VERIFY WHITE COUNT CONFIRMED ON SMEAR    RBC 3.24 (L) 4.22 - 5.81 MIL/uL   Hemoglobin 9.4 (L) 13.0 - 17.0 g/dL   HCT 29.4 (L) 39.0 - 52.0 %   MCV 90.7 80.0 - 100.0 fL   MCH 29.0 26.0 - 34.0 pg   MCHC 32.0 30.0 - 36.0 g/dL   RDW 15.9 (H) 11.5 - 15.5 %   Platelets 135 (L) 150 - 400 K/uL    Comment:  REPEATED TO VERIFY   nRBC 0.5 (H) 0.0 - 0.2 %    Comment: Performed at [facility], [place] [address], [place], [place] [address]  Glucose, capillary     Status: Abnormal   Collection Time: 04/02/19  4:16 PM  Result Value Ref Range   Glucose-Capillary 206 (H) 70 - 99 mg/dL  Glucose, capillary     Status: Abnormal   Collection Time: 04/02/19  8:33 PM  Result Value Ref Range   Glucose-Capillary 199 (H) 70 - 99 mg/dL  Heparin level (unfractionated)     Status: Abnormal   Collection Time: 04/02/19 11:00 PM  Result Value Ref Range   Heparin Unfractionated 0.11 (L) 0.30 - 0.70 IU/mL    Comment: (NOTE) If heparin results are below expected values, and patient dosage has  been confirmed, suggest follow up testing of antithrombin III levels. Performed at [facility], [place] [address], [place], [place] [address]   Glucose, capillary  Status: Abnormal   Collection Time: 04/02/19 11:26 PM  Result Value Ref Range   Glucose-Capillary 154 (H) 70 - 99 mg/dL  I-STAT, chem 8     Status: Abnormal   Collection Time: 04/02/19 11:31 PM  Result Value Ref Range   Sodium 134 (L) 135 - 145 mmol/L   Potassium 4.2 3.5 - 5.1 mmol/L   Chloride 99 98 - 111 mmol/L   BUN 40 (H) 8 - 23 mg/dL   Creatinine, Ser 1.70 (H) 0.61 - 1.24 mg/dL   Glucose, Bld 166 (H) 70 - 99 mg/dL   Calcium, Ion 1.11 (L) 1.15 - 1.40 mmol/L   TCO2 26 22 - 32 mmol/L   Hemoglobin 9.9 (L) 13.0 - 17.0 g/dL   HCT 29.0 (L) 39.0 - 52.0 %  CBC     Status: Abnormal   Collection Time: 04/03/19  4:06 AM  Result Value Ref Range   WBC 32.3 (H) 4.0 - 10.5 K/uL   RBC 3.06 (L) 4.22 - 5.81 MIL/uL   Hemoglobin 8.9 (L) 13.0 - 17.0 g/dL   HCT 28.2 (L) 39.0 - 52.0 %   MCV 92.2 80.0 - 100.0 fL   MCH 29.1 26.0 - 34.0 pg   MCHC 31.6 30.0 - 36.0 g/dL   RDW 16.2 (H) 11.5 - 15.5 %   Platelets 134 (L) 150 - 400 K/uL   nRBC 0.4 (H) 0.0 - 0.2 %    Comment: Performed at [facility], [address], [place], [place] [address]  LDH      Status: Abnormal   Collection Time: 04/03/19  4:06 AM  Result Value Ref Range   LDH 265 (H) 98 - 192 U/L    Comment: Performed at [facility] [address], [place], [place] [address]  Magnesium     Status: Abnormal   Collection Time: 04/03/19  4:06 AM  Result Value Ref Range   Magnesium 2.5 (H) 1.7 - 2.4 mg/dL    Comment: Performed at [facility] [address] [place], [place] [address]  APTT     Status: Abnormal   Collection Time: 04/03/19  4:06 AM  Result Value Ref Range   aPTT 90 (H) 24 - 36 seconds    Comment:        IF BASELINE aPTT IS ELEVATED, SUGGEST PATIENT RISK ASSESSMENT BE USED TO DETERMINE APPROPRIATE ANTICOAGULANT THERAPY. Performed at [facility], [place] [address], [place], [place] [address]   Glucose, capillary     Status: Abnormal   Collection Time: 04/03/19  4:16 AM  Result Value Ref Range   Glucose-Capillary 183 (H) 70 - 99 mg/dL  Cooxemetry Panel (carboxy, met, total hgb, O2 sat)     Status: None   Collection Time: 04/03/19  4:20 AM  Result Value Ref Range   Total hemoglobin 12.7 12.0 - 16.0 g/dL   O2 Saturation 69.1 %   Carboxyhemoglobin 1.5 0.5 - 1.5 %   Methemoglobin 0.9 0.0 - 1.5 %    Comment: Performed at [facility], [place] [address] [place], [place] [address]  I-STAT 7, (LYTES, BLD GAS, ICA, H+H)     Status: Abnormal   Collection Time: 04/03/19  5:01 AM  Result Value Ref Range   pH, Arterial 7.447 7.350 - 7.450   pCO2 arterial 35.0 32.0 - 48.0 mmHg   pO2, Arterial 70.0 (L) 83.0 - 108.0 mmHg   Bicarbonate 24.2 20.0 - 28.0 mmol/L   TCO2 25 22 -  32 mmol/L   O2 Saturation 95.0 %   Sodium 136 135 - 145 mmol/L   Potassium 4.2 3.5 - 5.1 mmol/L   Calcium, Ion 1.05 (L) 1.15 - 1.40 mmol/L   HCT 25.0 (L) 39.0 - 52.0 %   Hemoglobin 8.5 (L) 13.0 - 17.0 g/dL   Patient temperature 98.3 F    Sample type ARTERIAL   Comprehensive metabolic panel     Status: Abnormal   Collection Time: 04/03/19  5:48 AM  Result Value Ref Range    Sodium 133 (L) 135 - 145 mmol/L   Potassium 4.2 3.5 - 5.1 mmol/L   Chloride 97 (L) 98 - 111 mmol/L   CO2 25 22 - 32 mmol/L   Glucose, Bld 253 (H) 70 - 99 mg/dL   BUN 40 (H) 8 - 23 mg/dL   Creatinine, Ser 1.78 (H) 0.61 - 1.24 mg/dL   Calcium 7.6 (L) 8.9 - 10.3 mg/dL   Total Protein 5.3 (L) 6.5 - 8.1 g/dL   Albumin 1.9 (L) 3.5 - 5.0 g/dL   AST 33 15 - 41 U/L   ALT 24 0 - 44 U/L   Alkaline Phosphatase 76 38 - 126 U/L   Total Bilirubin 0.9 0.3 - 1.2 mg/dL   GFR calc non Af Amer 33 (L) >60 mL/min   GFR calc Af Amer 39 (L) >60 mL/min   Anion gap 11 5 - 15    Comment: Performed at [facility], [place] [address] [place], [place] [address]  Heparin level (unfractionated)     Status: Abnormal   Collection Time: 04/03/19  7:33 AM  Result Value Ref Range   Heparin Unfractionated 0.18 (L) 0.30 - 0.70 IU/mL    Comment: (NOTE) If heparin results are below expected values, and patient dosage has  been confirmed, suggest follow up testing of antithrombin III levels. Performed at [facility], [place] [address], [place], [place] [address]   Glucose, capillary     Status: Abnormal   Collection Time: 04/03/19  7:45 AM  Result Value Ref Range   Glucose-Capillary 166 (H) 70 - 99 mg/dL  Culture, respiratory (non-expectorated)     Status: None (Preliminary result)   Collection Time: 04/03/19 11:08 AM   Specimen: Tracheal Aspirate; Respiratory  Result Value Ref Range   Specimen Description TRACHEAL ASPIRATE    Special Requests NONE    Gram Stain      FEW WBC PRESENT, PREDOMINANTLY PMN FEW GRAM NEGATIVE RODS RARE YEAST Performed at [facility], [place] [address], [place], [place] [address]    Culture FEW PSEUDOMONAS AERUGINOSA    Report Status PENDING   Glucose, capillary     Status: Abnormal   Collection Time: 04/03/19 11:49 AM  Result Value Ref Range   Glucose-Capillary 151 (H) 70 - 99 mg/dL  Culture, blood (Routine X 2) w Reflex to ID Panel     Status: None (Preliminary  result)   Collection Time: 04/03/19 12:30 PM   Specimen: BLOOD LEFT HAND  Result Value Ref Range   Specimen Description BLOOD LEFT HAND    Special Requests      BOTTLES DRAWN AEROBIC ONLY Blood Culture results may not be optimal due to an inadequate volume of blood received in culture bottles   Culture      NO GROWTH < 24 HOURS Performed at [facility] [address] [place], [place] [address]    Report Status PENDING   Culture, blood (Routine X  2) w Reflex to ID Panel     Status: None (Preliminary result)   Collection Time: 04/03/19 12:35 PM   Specimen: BLOOD LEFT HAND  Result Value Ref Range   Specimen Description BLOOD LEFT HAND    Special Requests      BOTTLES DRAWN AEROBIC ONLY Blood Culture results may not be optimal due to an inadequate volume of blood received in culture bottles   Culture      NO GROWTH < 24 HOURS Performed at [facility] [address], [place], [place] [address]    Report Status PENDING   Renal function panel     Status: Abnormal   Collection Time: 04/03/19  4:00 PM  Result Value Ref Range   Sodium 133 (L) 135 - 145 mmol/L   Potassium 4.6 3.5 - 5.1 mmol/L   Chloride 100 98 - 111 mmol/L   CO2 22 22 - 32 mmol/L   Glucose, Bld 201 (H) 70 - 99 mg/dL   BUN 43 (H) 8 - 23 mg/dL   Creatinine, Ser 1.86 (H) 0.61 - 1.24 mg/dL   Calcium 7.6 (L) 8.9 - 10.3 mg/dL   Phosphorus 2.2 (L) 2.5 - 4.6 mg/dL   Albumin 1.8 (L) 3.5 - 5.0 g/dL   GFR calc non Af Amer 32 (L) >60 mL/min   GFR calc Af Amer 37 (L) >60 mL/min   Anion gap 11 5 - 15    Comment: Performed at [facility] [address], [place], [place] [address]  CBC     Status: Abnormal   Collection Time: 04/03/19  4:00 PM  Result Value Ref Range   WBC 36.1 (H) 4.0 - 10.5 K/uL   RBC 2.86 (L) 4.22 - 5.81 MIL/uL   Hemoglobin 8.4 (L) 13.0 - 17.0 g/dL   HCT 26.9 (L) 39.0 - 52.0 %   MCV 94.1 80.0 - 100.0 fL   MCH 29.4 26.0 - 34.0 pg   MCHC 31.2 30.0 - 36.0 g/dL   RDW 16.5 (H) 11.5 -  15.5 %   Platelets 127 (L) 150 - 400 K/uL    Comment: REPEATED TO VERIFY CONSISTENT WITH PREVIOUS RESULT    nRBC 0.6 (H) 0.0 - 0.2 %    Comment: Performed at [facility], [place] [address], [place], [place] [address]  Glucose, capillary     Status: Abnormal   Collection Time: 04/03/19  4:12 PM  Result Value Ref Range   Glucose-Capillary 198 (H) 70 - 99 mg/dL  Heparin level (unfractionated)     Status: Abnormal   Collection Time: 04/03/19  4:18 PM  Result Value Ref Range   Heparin Unfractionated 0.22 (L) 0.30 - 0.70 IU/mL    Comment: (NOTE) If heparin results are below expected values, and patient dosage has  been confirmed, suggest follow up testing of antithrombin III levels. Performed at [facility], [place] [address] [place], [place] [address]   Glucose, capillary     Status: Abnormal   Collection Time: 04/03/19  8:32 PM  Result Value Ref Range   Glucose-Capillary 150 (H) 70 - 99 mg/dL  Glucose, capillary     Status: Abnormal   Collection Time: 04/03/19 11:24 PM  Result Value Ref Range   Glucose-Capillary 146 (H) 70 - 99 mg/dL  CBC     Status: Abnormal   Collection Time: 04/04/19  3:55 AM  Result Value Ref Range   WBC 25.8 (H) 4.0 - 10.5 K/uL   RBC 2.31 (  L) 4.22 - 5.81 MIL/uL   Hemoglobin 6.8 (LL) 13.0 - 17.0 g/dL    Comment: REPEATED TO VERIFY THIS CRITICAL RESULT HAS VERIFIED AND BEEN CALLED TO S.MCAVINUE,RN BY MELISSA BROGDON ON 02 17 2021 AT 0437, AND HAS BEEN READ BACK.     HCT 21.9 (L) 39.0 - 52.0 %   MCV 94.8 80.0 - 100.0 fL   MCH 29.4 26.0 - 34.0 pg   MCHC 31.1 30.0 - 36.0 g/dL   RDW 16.9 (H) 11.5 - 15.5 %   Platelets 104 (L) 150 - 400 K/uL    Comment: REPEATED TO VERIFY PLATELET COUNT CONFIRMED BY SMEAR SPECIMEN CHECKED FOR CLOTS Immature Platelet Fraction may be clinically indicated, consider ordering this additional test [id number]    nRBC 0.5 (H) 0.0 - 0.2 %    Comment: Performed at [facility] [address], [place], [place]  [address]  LDH     Status: Abnormal   Collection Time: 04/04/19  3:55 AM  Result Value Ref Range   LDH 228 (H) 98 - 192 U/L    Comment: Performed at [facility] [address] [place], [place] [address]  Magnesium     Status: Abnormal   Collection Time: 04/04/19  3:55 AM  Result Value Ref Range   Magnesium 2.5 (H) 1.7 - 2.4 mg/dL    Comment: Performed at [facility] [address] [place], [place] [address]  APTT     Status: Abnormal   Collection Time: 04/04/19  3:55 AM  Result Value Ref Range   aPTT 99 (H) 24 - 36 seconds    Comment:        IF BASELINE aPTT IS ELEVATED, SUGGEST PATIENT RISK ASSESSMENT BE USED TO DETERMINE APPROPRIATE ANTICOAGULANT THERAPY. Performed at [facility], [place] [address] [place], [place] [address]   Heparin level (unfractionated)     Status: Abnormal   Collection Time: 04/04/19  3:55 AM  Result Value Ref Range   Heparin Unfractionated 0.13 (L) 0.30 - 0.70 IU/mL    Comment: (NOTE) If heparin results are below expected values, and patient dosage has  been confirmed, suggest follow up testing of antithrombin III levels. Performed at [facility], [place] [address] [place], [place] [address]   Comprehensive metabolic panel     Status: Abnormal   Collection Time: 04/04/19  3:55 AM  Result Value Ref Range   Sodium 133 (L) 135 - 145 mmol/L   Potassium 4.3 3.5 - 5.1 mmol/L   Chloride 98 98 - 111 mmol/L   CO2 22 22 - 32 mmol/L   Glucose, Bld 196 (H) 70 - 99 mg/dL   BUN 38 (H) 8 - 23 mg/dL   Creatinine, Ser 1.77 (H) 0.61 - 1.24 mg/dL   Calcium 7.5 (L) 8.9 - 10.3 mg/dL   Total Protein 5.5 (L) 6.5 - 8.1 g/dL   Albumin 2.2 (L) 3.5 - 5.0 g/dL   AST 39 15 - 41 U/L   ALT 25 0 - 44 U/L   Alkaline Phosphatase 73 38 - 126 U/L   Total Bilirubin 0.9 0.3 - 1.2 mg/dL   GFR calc non Af Amer 34 (L) >60 mL/min   GFR calc Af Amer 39 (L) >60 mL/min   Anion gap 13 5 - 15    Comment: Performed at [facility], [place] [address],  [place], [place] [address]  Cooxemetry Panel (carboxy, met, total hgb, O2 sat)  Status: Abnormal   Collection Time: 04/04/19  4:10 AM  Result Value Ref Range   Total hemoglobin 7.3 (L) 12.0 - 16.0 g/dL   O2 Saturation 72.3 %   Carboxyhemoglobin 1.8 (H) 0.5 - 1.5 %   Methemoglobin 1.1 0.0 - 1.5 %    Comment: Performed at [place] [address], [place], [place] [address]  Glucose, capillary     Status: Abnormal   Collection Time: 04/04/19  4:14 AM  Result Value Ref Range   Glucose-Capillary 194 (H) 70 - 99 mg/dL  I-STAT 7, (LYTES, BLD GAS, ICA, H+H)     Status: Abnormal   Collection Time: 04/04/19  4:18 AM  Result Value Ref Range   pH, Arterial 7.444 7.350 - 7.450   pCO2 arterial 31.9 (L) 32.0 - 48.0 mmHg   pO2, Arterial 68.0 (L) 83.0 - 108.0 mmHg   Bicarbonate 22.0 20.0 - 28.0 mmol/L   TCO2 23 22 - 32 mmol/L   O2 Saturation 95.0 %   Acid-base deficit 2.0 0.0 - 2.0 mmol/L   Sodium 133 (L) 135 - 145 mmol/L   Potassium 4.2 3.5 - 5.1 mmol/L   Calcium, Ion 1.09 (L) 1.15 - 1.40 mmol/L   HCT 20.0 (L) 39.0 - 52.0 %   Hemoglobin 6.8 (LL) 13.0 - 17.0 g/dL   Patient temperature 97.8 F    Collection site FEMORAL ARTERY    Sample type ARTERIAL    Comment NOTIFIED PHYSICIAN   Type and screen [place]     Status: None (Preliminary result)   Collection Time: 04/04/19  5:25 AM  Result Value Ref Range   ABO/RH(D) B POS    Antibody Screen NEG    Sample Expiration 04/07/2019,2359    Unit Number [id number]    Blood Component Type RED CELLS,LR    Unit division 00    Status of Unit ISSUED    Transfusion Status OK TO TRANSFUSE    Crossmatch Result Compatible    Unit Number [id number]    Blood Component Type RED CELLS,LR    Unit division 00    Status of Unit ISSUED    Transfusion Status OK TO TRANSFUSE    Crossmatch Result      Compatible Performed at [facility], [address] [place], [place] [address]   Prepare RBC     Status: None    Collection Time: 04/04/19  5:35 AM  Result Value Ref Range   Order Confirmation      ORDER PROCESSED BY BLOOD BANK Performed at [facility], [place] [address] [place], [place] [address]   Prepare RBC     Status: None   Collection Time: 04/04/19  7:30 AM  Result Value Ref Range   Order Confirmation      ORDER PROCESSED BY BLOOD BANK BB SAMPLE OR UNITS ALREADY AVAILABLE Performed at [facility], [place] [address], [place], [place] [address]   Glucose, capillary     Status: Abnormal   Collection Time: 04/04/19  8:33 AM  Result Value Ref Range   Glucose-Capillary 163 (H) 70 - 99 mg/dL  Glucose, capillary     Status: Abnormal   Collection Time: 04/04/19 12:09 PM  Result Value Ref Range   Glucose-Capillary 142 (H) 70 - 99 mg/dL   Recent Results (from the past 240 hour(s))  Culture, respiratory (non-expectorated)     Status: None   Collection Time: 03/26/19  6:29 AM   Specimen: Tracheal Aspirate;  Respiratory  Result Value Ref Range Status   Specimen Description TRACHEAL ASPIRATE  Final   Special Requests NONE  Final   Gram Stain   Final    ABUNDANT WBC PRESENT,BOTH PMN AND MONONUCLEAR NO ORGANISMS SEEN Performed at [facility], [address] [place], [place] [address]    Culture FEW PSEUDOMONAS AERUGINOSA  Final   Report Status 03/28/2019 FINAL  Final   Organism ID, Bacteria PSEUDOMONAS AERUGINOSA  Final      Susceptibility   Pseudomonas aeruginosa - MIC*    CEFTAZIDIME 8 SENSITIVE Sensitive     CIPROFLOXACIN <=0.25 SENSITIVE Sensitive     GENTAMICIN 4 SENSITIVE Sensitive     IMIPENEM 2 SENSITIVE Sensitive     * FEW PSEUDOMONAS AERUGINOSA  Culture, respiratory (non-expectorated)     Status: None (Preliminary result)   Collection Time: 04/03/19 11:08 AM   Specimen: Tracheal Aspirate; Respiratory  Result Value Ref Range Status   Specimen Description TRACHEAL ASPIRATE  Final   Special Requests NONE  Final   Gram Stain   Final    FEW WBC PRESENT,  PREDOMINANTLY PMN FEW GRAM NEGATIVE RODS RARE YEAST Performed at [facility], [place] [address] [place], [place] [address]    Culture FEW PSEUDOMONAS AERUGINOSA  Final   Report Status PENDING  Incomplete  Culture, blood (Routine X 2) w Reflex to ID Panel     Status: None (Preliminary result)   Collection Time: 04/03/19 12:30 PM   Specimen: BLOOD LEFT HAND  Result Value Ref Range Status   Specimen Description BLOOD LEFT HAND  Final   Special Requests   Final    BOTTLES DRAWN AEROBIC ONLY Blood Culture results may not be optimal due to an inadequate volume of blood received in culture bottles   Culture   Final    NO GROWTH < 24 HOURS Performed at [facility], [place] [address], [place], [place] [address]    Report Status PENDING  Incomplete  Culture, blood (Routine X 2) w Reflex to ID Panel     Status: None (Preliminary result)   Collection Time: 04/03/19 12:35 PM   Specimen: BLOOD LEFT HAND  Result Value Ref Range Status   Specimen Description BLOOD LEFT HAND  Final   Special Requests   Final    BOTTLES DRAWN AEROBIC ONLY Blood Culture results may not be optimal due to an inadequate volume of blood received in culture bottles   Culture   Final    NO GROWTH < 24 HOURS Performed at [facility], [place] [address], [place], [place] [address]    Report Status PENDING  Incomplete   Creatinine: Recent Labs    04/01/19 1844 04/02/19 0349 04/02/19 1612 04/02/19 2331 04/03/19 0548 04/03/19 1600 04/04/19 0355  CREATININE 1.79* 1.83* 1.75* 1.70* 1.78* 1.86* 1.77*    Xrays: See report/chart none  Impression/Assessment:  Changed 18 coude easily but irrigation was minimal Could not insert 22 coude 3 way due to distal meatal mild stenosis Dilated gently and sterile with male sounds 18-22 Fr Coude 22 went in well with some mild urethral resistance but easily thru prostate Irrigated moderated clots CBI working well   Plan:  follow  Event organiser A  Ninetta Adelstein 04/04/2019, 12:57 PM

## 2019-04-05 ENCOUNTER — Inpatient Hospital Stay (HOSPITAL_COMMUNITY): Payer: Medicare Other

## 2019-04-05 LAB — CBC
HCT: 25.2 % — ABNORMAL LOW (ref 39.0–52.0)
Hemoglobin: 8.3 g/dL — ABNORMAL LOW (ref 13.0–17.0)
MCH: 30.4 pg (ref 26.0–34.0)
MCHC: 32.9 g/dL (ref 30.0–36.0)
MCV: 92.3 fL (ref 80.0–100.0)
Platelets: 80 10*3/uL — ABNORMAL LOW (ref 150–400)
RBC: 2.73 MIL/uL — ABNORMAL LOW (ref 4.22–5.81)
RDW: 17.1 % — ABNORMAL HIGH (ref 11.5–15.5)
WBC: 17.3 10*3/uL — ABNORMAL HIGH (ref 4.0–10.5)
nRBC: 0.3 % — ABNORMAL HIGH (ref 0.0–0.2)

## 2019-04-05 LAB — POCT I-STAT 7, (LYTES, BLD GAS, ICA,H+H)
Acid-base deficit: 1 mmol/L (ref 0.0–2.0)
Bicarbonate: 23.3 mmol/L (ref 20.0–28.0)
Calcium, Ion: 1.09 mmol/L — ABNORMAL LOW (ref 1.15–1.40)
HCT: 23 % — ABNORMAL LOW (ref 39.0–52.0)
Hemoglobin: 7.8 g/dL — ABNORMAL LOW (ref 13.0–17.0)
O2 Saturation: 95 %
Potassium: 4.3 mmol/L (ref 3.5–5.1)
Sodium: 135 mmol/L (ref 135–145)
TCO2: 24 mmol/L (ref 22–32)
pCO2 arterial: 35.1 mmHg (ref 32.0–48.0)
pH, Arterial: 7.429 (ref 7.350–7.450)
pO2, Arterial: 75 mmHg — ABNORMAL LOW (ref 83.0–108.0)

## 2019-04-05 LAB — RENAL FUNCTION PANEL
Albumin: 2.4 g/dL — ABNORMAL LOW (ref 3.5–5.0)
Albumin: 2.3 g/dL — ABNORMAL LOW (ref 3.5–5.0)
Anion gap: 11 (ref 5–15)
Anion gap: 10 (ref 5–15)
BUN: 36 mg/dL — ABNORMAL HIGH (ref 8–23)
BUN: 35 mg/dL — ABNORMAL HIGH (ref 8–23)
CO2: 24 mmol/L (ref 22–32)
CO2: 24 mmol/L (ref 22–32)
Calcium: 7.7 mg/dL — ABNORMAL LOW (ref 8.9–10.3)
Calcium: 7.7 mg/dL — ABNORMAL LOW (ref 8.9–10.3)
Chloride: 99 mmol/L (ref 98–111)
Chloride: 101 mmol/L (ref 98–111)
Creatinine, Ser: 1.64 mg/dL — ABNORMAL HIGH (ref 0.61–1.24)
Creatinine, Ser: 1.73 mg/dL — ABNORMAL HIGH (ref 0.61–1.24)
GFR calc Af Amer: 43 mL/min — ABNORMAL LOW (ref >60)
GFR calc Af Amer: 40 mL/min — ABNORMAL LOW (ref >60)
GFR calc non Af Amer: 37 mL/min — ABNORMAL LOW (ref >60)
GFR calc non Af Amer: 34 mL/min — ABNORMAL LOW (ref >60)
Glucose, Bld: 192 mg/dL — ABNORMAL HIGH (ref 70–99)
Glucose, Bld: 134 mg/dL — ABNORMAL HIGH (ref 70–99)
Phosphorus: 2.1 mg/dL — ABNORMAL LOW (ref 2.5–4.6)
Phosphorus: 3 mg/dL (ref 2.5–4.6)
Potassium: 4.3 mmol/L (ref 3.5–5.1)
Potassium: 4.6 mmol/L (ref 3.5–5.1)
Sodium: 134 mmol/L — ABNORMAL LOW (ref 135–145)
Sodium: 135 mmol/L (ref 135–145)

## 2019-04-05 LAB — COOXEMETRY PANEL
Carboxyhemoglobin: 1.3 % (ref 0.5–1.5)
Methemoglobin: 0.6 % (ref 0.0–1.5)
O2 Saturation: 62.1 %
Total hemoglobin: 8.3 g/dL — ABNORMAL LOW (ref 12.0–16.0)

## 2019-04-05 LAB — GLUCOSE, CAPILLARY
Glucose-Capillary: 146 mg/dL — ABNORMAL HIGH (ref 70–99)
Glucose-Capillary: 184 mg/dL — ABNORMAL HIGH (ref 70–99)
Glucose-Capillary: 140 mg/dL — ABNORMAL HIGH (ref 70–99)
Glucose-Capillary: 142 mg/dL — ABNORMAL HIGH (ref 70–99)
Glucose-Capillary: 138 mg/dL — ABNORMAL HIGH (ref 70–99)
Glucose-Capillary: 155 mg/dL — ABNORMAL HIGH (ref 70–99)

## 2019-04-05 LAB — APTT: aPTT: 45 seconds — ABNORMAL HIGH (ref 24–36)

## 2019-04-05 LAB — LACTATE DEHYDROGENASE: LDH: 218 U/L — ABNORMAL HIGH (ref 98–192)

## 2019-04-05 LAB — MAGNESIUM: Magnesium: 2.6 mg/dL — ABNORMAL HIGH (ref 1.7–2.4)

## 2019-04-05 LAB — HEPARIN LEVEL (UNFRACTIONATED)
Heparin Unfractionated: <0.1 IU/mL — ABNORMAL LOW (ref 0.30–0.70)
Heparin Unfractionated: <0.1 IU/mL — ABNORMAL LOW (ref 0.30–0.70)

## 2019-04-05 LAB — PREPARE RBC (CROSSMATCH)

## 2019-04-05 MED ORDER — SODIUM CHLORIDE 0.9% IV SOLUTION: Freq: Once | INTRAVENOUS | Status: AC

## 2019-04-05 MED ORDER — SODIUM PHOSPHATES 45 MMOLE/15ML IV SOLN
20.0000 mmol | Freq: Once | INTRAVENOUS | Status: AC
  Administered 2019-04-05: 20 mmol via INTRAVENOUS
  Filled 2019-04-05: qty 6.67

## 2019-04-05 MED ORDER — HEPARIN (PORCINE) 25000 UT/250ML-% IV SOLN
500.0000 [IU]/h | INTRAVENOUS | Status: DC
  Administered 2019-04-05: 400 [IU]/h via INTRAVENOUS
  Filled 2019-04-05: qty 250

## 2019-04-05 NOTE — Progress Notes (Signed)
[place] for Anticoagulation management/assistance  Indication: Impella 5.5  No Known Allergies  Patient Measurements: Height: 5\' 8"  (172.7 cm) Weight: 202 lb 13.2 oz (92 kg) IBW/kg (Calculated) : 68.4 Heparin Dosing Weight: 86kg  Vital Signs: Temp: 98.5 F (36.9 C) (02/18 1900) Temp Source: Oral (02/18 1014) Pulse Rate: 78 (02/18 2100)  Labs: Recent Labs    04/03/19 0406 04/03/19 0501 04/04/19 0355 04/04/19 0418 04/04/19 1457 04/04/19 1457 04/05/19 0505 04/05/19 0540 04/05/19 1622 04/05/19 2101  HGB 8.9*   < > 6.8*   < > 8.6*   < > 8.3* 7.8*  --   --   HCT 28.2*   < > 21.9*   < > 26.7*  --  25.2* 23.0*  --   --   PLT 134*   < > 104*  --  80*  --  80*  --   --   --   APTT 90*  --  99*  --   --   --  45*  --   --   --   HEPARINUNFRC  --    < > 0.13*  --   --   --  <0.10*  --   --  <0.10*  CREATININE  --    < > 1.77*   < > 1.76*  --  1.64*  --  1.73*  --    < > = values in this interval not displayed.    Estimated Creatinine Clearance: 32.5 mL/min (A) (by C-G formula based on SCr of 1.73 mg/dL (H)).  Assessment: 86 year old male s/p CABG 1/29 with MAZE and LAA. Patient taken to OR 2/9 for tracheostomy and impella 5.5 placement.   Purge (25 units/ml of heparin) is running at 14 ml/hr, which provides 350 units/hr of heparin, and Dr. Prescott Gum added 400 units/hr of systemic heparin this afternoon.   Evening heparin level undetectable as expected, will defer rate adjustments to surgery at this time. No bleeding issues noted.   Goal of Therapy:  Heparin level ~0.3 units/mL per Dr. VT Monitor platelets by anticoagulation protocol: Yes   Plan:   Continue 1/2 strength heparin purge per MD Continue systemic heparin at 400 units/hr  Continue to follow daily heparin levels to rule out accumulation Monitor CBC, s/sx bleeding  Erin Hearing PharmD., BCPS Clinical Pharmacist 04/05/2019 10:02 PM

## 2019-04-05 NOTE — Progress Notes (Addendum)
f    Advanced Heart Failure Rounding Note  PCP-Cardiologist: Kate Sable, MD   Subjective:    Remains on impella 5.5 support, P-6, Flow 3.8   Started CVVHD 2/11. UF remains at -75 but has been limited by low BP. Weight up 1 pound.    On vent through trach. Respiratory culture with pseudomonas.  In NSR. Remains on amio 60 mg per hour.    Remains on vasopressin 0.03 units + norepi 13  mcg.    Flo-trak: CO 6.1 CI 2.9  SVR 698   Denies pain. No melena.     Objective:   Weight Range: 92 kg Body mass index is 30.84 kg/m.   Vital Signs:   Temp:  [97.7 F (36.5 C)-98.3 F (36.8 C)] 97.7 F (36.5 C) (02/18 0400) Pulse Rate:  [67-81] 69 (02/18 0600) Resp:  [14-32] 19 (02/18 0600) BP: (127)/(46) 127/46 (02/17 1542) SpO2:  [80 %-100 %] 99 % (02/18 0600) Arterial Line BP: (85-151)/(42-103) 117/46 (02/18 0600) FiO2 (%):  [40 %] 40 % (02/18 0353) Weight:  [92 kg] 92 kg (02/18 0450) Last BM Date: 04/04/19  Weight change: Filed Weights   04/03/19 0500 04/04/19 0500 04/05/19 0450  Weight: 95.1 kg 91.4 kg 92 kg    Intake/Output:   Intake/Output Summary (Last 24 hours) at 04/05/2019 0739 Last data filed at 04/05/2019 0700 Gross per 24 hour  Intake 3509.44 ml  Output 3696 ml  Net -186.56 ml      Physical Exam  CVP 8  General:  On vent/trach. No resp difficulty HEENT:normal  Neck: supple. JVP hard to assess.  Carotids 2+ bilat; no bruits. No lymphadenopathy or thryomegaly appreciated. Cor: PMI nondisplaced. Regular rate & rhythm. No rubs, gallops or murmurs. R axillary impella Lungs: Rhonchi throughtout.  Abdomen: soft, nontender, nondistended. No hepatosplenomegaly. No bruits or masses. Good bowel sounds. Extremities: no cyanosis, clubbing, rash, R and LLE 1+ edema Neuro: alert  Follows commands.  GU: CBI with pink tinged color no clots.    Telemetry  NSR 60-70s   Labs    CBC Recent Labs    04/04/19 1457 04/04/19 1457 04/05/19 0505 04/05/19 0540    WBC 17.8*  --  17.3*  --   HGB 8.6*   < > 8.3* 7.8*  HCT 26.7*   < > 25.2* 23.0*  MCV 92.1  --  92.3  --   PLT 80*  --  80*  --    < > = values in this interval not displayed.   Basic Metabolic Panel Recent Labs    04/04/19 0355 04/04/19 0418 04/04/19 1457 04/04/19 1457 04/05/19 0505 04/05/19 0540  NA 133*   < > 134*   < > 134* 135  K 4.3   < > 4.3   < > 4.3 4.3  CL 98   < > 102  --  99  --   CO2 22   < > 22  --  24  --   GLUCOSE 196*   < > 120*  --  192*  --   BUN 38*   < > 40*  --  36*  --   CREATININE 1.77*   < > 1.76*  --  1.64*  --   CALCIUM 7.5*   < > 7.6*  --  7.7*  --   MG 2.5*  --   --   --  2.6*  --   PHOS  --   --  2.5  --  2.1*  --    < > =  values in this interval not displayed.   Liver Function Tests Recent Labs    04/03/19 0548 04/03/19 1600 04/04/19 0355 04/04/19 0355 04/04/19 1457 04/05/19 0505  AST 33  --  39  --   --   --   ALT 24  --  25  --   --   --   ALKPHOS 76  --  73  --   --   --   BILITOT 0.9  --  0.9  --   --   --   PROT 5.3*  --  5.5*  --   --   --   ALBUMIN 1.9*   < > 2.2*   < > 2.6* 2.4*   < > = values in this interval not displayed.   No results for input(s): LIPASE, AMYLASE in the last 72 hours. Cardiac Enzymes No results for input(s): CKTOTAL, CKMB, CKMBINDEX, TROPONINI in the last 72 hours.  BNP: BNP (last 3 results) Recent Labs    03/22/19 0401 03/26/19 0626 03/27/19 1255  BNP 340.4* 687.5* 800.4*    ProBNP (last 3 results) No results for input(s): PROBNP in the last 8760 hours.   D-Dimer No results for input(s): DDIMER in the last 72 hours. Hemoglobin A1C No results for input(s): HGBA1C in the last 72 hours. Fasting Lipid Panel No results for input(s): CHOL, HDL, LDLCALC, TRIG, CHOLHDL, LDLDIRECT in the last 72 hours. Thyroid Function Tests No results for input(s): TSH, T4TOTAL, T3FREE, THYROIDAB in the last 72 hours.  Invalid input(s): FREET3  Other results:   Imaging    DG Chest Port 1  View  Result Date: 04/05/2019 CLINICAL DATA:  History of CABG. EXAM: PORTABLE CHEST 1 VIEW COMPARISON:  Radiograph yesterday. FINDINGS: Tracheostomy tube tip at the thoracic inlet. 2 enteric tubes remain in place with tip below the diaphragm. Right upper extremity PICC and left subclavian central line remain in place. Right subclavian Impella unchanged in positioning. Post median sternotomy. Unchanged cardiomegaly post CABG. Left atrial clipping. Hazy bilateral lung opacities, left greater than right, likely related to pleural effusion and adjacent atelectasis. Stable pulmonary vasculature. No new airspace disease. No pneumothorax. IMPRESSION: 1. Stable support apparatus. 2. Stable hazy bilateral lower lung zone opacities likely combination of pleural fluid and atelectasis, left greater than right. Electronically Signed   By: Keith Rake M.D.   On: 04/05/2019 05:22     Medications:     Scheduled Medications: . aspirin  81 mg Per Tube Daily  . atorvastatin  80 mg Per Tube q1800  . B-complex with vitamin C  1 tablet Oral Daily  . bisacodyl  10 mg Oral Daily   Or  . bisacodyl  10 mg Rectal Daily  . chlorhexidine gluconate (MEDLINE KIT)  15 mL Mouth Rinse BID  . Chlorhexidine Gluconate Cloth  6 each Topical Daily  . docusate  200 mg Per Tube Daily  . feeding supplement (PRO-STAT SUGAR FREE 64)  60 mL Per Tube BID  . Gerhardt's butt cream   Topical BID  . insulin aspart  0-24 Units Subcutaneous Q4H  . insulin aspart  5 Units Subcutaneous Q4H  . insulin glargine  26 Units Subcutaneous Daily  . mouth rinse  15 mL Mouth Rinse 10 times per day  . midodrine  10 mg Oral TID WC  . pantoprazole (PROTONIX) IV  40 mg Intravenous Q24H  . sodium chloride flush  10-40 mL Intracatheter Q12H  . tamsulosin  0.4 mg Oral Daily    Infusions: .  prismasol BGK 4/2.5 400 mL/hr at 04/04/19 2206  .  prismasol BGK 4/2.5 200 mL/hr at 04/05/19 0529  . sodium chloride    . sodium chloride Stopped (04/04/19  1736)  . sodium chloride 10 mL/hr at 03/29/19 2128  . amiodarone 60 mg/hr (04/05/19 0700)  . feeding supplement (VITAL 1.5 CAL) 1,000 mL (04/04/19 1755)  . impella catheter heparin 25 unit/mL in dextrose 5%    . norepinephrine (LEVOPHED) Adult infusion 13 mcg/min (04/05/19 0700)  . prismasol BGK 4/2.5 1,500 mL/hr at 04/05/19 2831  . vancomycin Stopped (04/04/19 1840)  . vasopressin (PITRESSIN) infusion - *FOR SHOCK* 0.03 Units/min (04/05/19 0700)    PRN Medications: sodium chloride, oxyCODONE **AND** acetaminophen, dextrose, docusate, fentaNYL (SUBLIMAZE) injection, heparin, heparin, midazolam, ondansetron (ZOFRAN) IV, sodium chloride flush, sodium chloride flush    Assessment/Plan    1. Acute systolic HF -> Cardiogenic shock - post-op echo on 03/20/19 EF 25-30% (pre-op 30-35%. In 12/2017 EF normal) - Remains on Impella support P-6 Waveforms ok LDH  - CO-OX 62%  . On VP 0.03  and NE 13 mcg.  - Likely vasodilated with CVVHD and Impella. - Continue CVVHD.  - He remains critically ill. Hemodynamically improved with full support with impella and dual pressors. -   2. CAD s/p CABG this admit (LIMA->LAD, SVG->OM, SVG->RCA on 03/15/19) - No current s/s angina - Continue ASA/statin  3. Acute hypoxic respiratory failure with Pseudomonas PNA - s/p trach on 03/27/19 - WBC 36>26>17 - Vanc stopped 2/17  - CCM managing vent   4. PAF - s/p MAZE and LAA occlusion - In/out of AF. Remains in NSR  -continue IV amio - Off heparin with melena  5. AKI  - due to shock/ATN -Creatinine peaked at 5.04 - Started [facility] 03/29/19 per Nephrology.   6. Hematuria -LDH ok. Off heparin drip.  - Getting continuous bladder irrigation.   - Urology following.   7. Melena No melena overnight.   8. Anemia -Hematuria/melena -Hgb 7.6 -->10.2 ->8.9 >6.8 >8.3  - Receiving blood today     Length of Stay: [place], NP  04/05/2019, 7:39 AM  Advanced Heart Failure Team Pager [phone] (M-F; 7a -  4p)  Please contact [facility] for night-coverage after hours (4p -7a ) and weekends on [email or web address]  Agree with above.   Awake on vent through trach. On CVVHD pulling 100/hr. Co-ox stable on Impella. Remains on NE 10 and VP 0.03. Still with blood in Foley. Melena resolved  General:  Weak appearing on vent. No resp difficulty HEENT: normal Neck: supple. + trach CVP 9 Carotids 2+ bilat; no bruits. No lymphadenopathy or thryomegaly appreciated. Cor: R axillary impella PMI nondisplaced. Regular rate & rhythm. No rubs, gallops or murmurs. Lungs: coarse Abdomen: soft, nontender, nondistended. No hepatosplenomegaly. No bruits or masses. Good bowel sounds. Extremities: no cyanosis, clubbing, rash, 1+ edema Neuro: alert and interactive   Remains critically ill. With MSOF. Volume status much improved. Now only 5 pounds up from pre-op weight. Will continue CVVHD one more day and then try to keep even and wean Impella and pressors over the next few days. Prognosis tenuous but is making progress and we will continue attempts to wean current support. Impella waveforms and flows reviewed personally. Parameters stable.   CRITICAL CARE Performed by: Glori Bickers  Total critical care time: 35 minutes  Critical care time was exclusive of separately billable procedures and treating other patients.  Critical care was necessary to treat or prevent imminent or life-threatening  deterioration.  Critical care was time spent personally by me (independent of midlevel providers or residents) on the following activities: development of treatment plan with patient and/or surrogate as well as nursing, discussions with consultants, evaluation of patient's response to treatment, examination of patient, obtaining history from patient or surrogate, ordering and performing treatments and interventions, ordering and review of laboratory studies, ordering and review of radiographic studies, pulse oximetry and  re-evaluation of patient's condition.  Glori Bickers, MD  2:31 PM

## 2019-04-05 NOTE — Progress Notes (Signed)
Patient ID: BENJI POYNTER, male   DOB: Apr 09, 1928, 86 y.o.   MRN: [id number] S: More awake and alert this morning.  Able to tolerate UF overnight of 1.5 liters O:BP (!) 127/46   Pulse 74   Temp 98.1 F (36.7 C) (Oral)   Resp (!) 25   Ht '5\' 8"'  (1.727 m)   Wt 92 kg   SpO2 100%   BMI 30.84 kg/m   Intake/Output Summary (Last 24 hours) at 04/05/2019 1031 Last data filed at 04/05/2019 1000 Gross per 24 hour  Intake 2714.29 ml  Output 4286 ml  Net -1571.71 ml   Intake/Output: I/O last 3 completed shifts: In: 6227 [I.V.:3200.9; Blood:630; Other:481.6; NG/GT:956; IV Piggyback:958.5] Out: 4954 [Urine:25; OJJKK:9381]  Intake/Output this shift:  Total I/O In: 158 [I.V.:56.8; Other:41.2; NG/GT:60] Out: 653 [Other:653] Weight change: 0.6 kg Gen: awake, alert, on vent via trach CVS: mechanical hum Resp: rhonchi bilaterally Abd: +BS, soft, NT Ext: 1+ edema  Recent Labs  Lab 03/30/19 0255 03/30/19 0546 03/30/19 1510 03/30/19 1510 03/31/19 0408 03/31/19 0408 03/31/19 1510 03/31/19 1510 04/01/19 0400 04/01/19 0455 04/01/19 1844 04/01/19 1844 04/02/19 0349 04/02/19 0450 04/02/19 1612 04/02/19 1612 04/02/19 2331 04/03/19 0501 04/03/19 0548 04/03/19 1600 04/04/19 0355 04/04/19 0418 04/04/19 1457 04/05/19 0505 04/05/19 0540  NA 132*   < > 132*   < > 133*  134*   < > 134*   < > 134*   < > 134*   < > 136   < > 134*   < > 134*   < > 133* 133* 133* 133* 134* 134* 135  K 3.6   < > 4.1   < > 4.3  4.4   < > 4.1   < > 4.5   < > 4.4   < > 4.3   < > 4.6   < > 4.2   < > 4.2 4.6 4.3 4.2 4.3 4.3 4.3  CL 96*   < > 97*   < > 99   < > 100   < > 100   < > 102   < > 101   < > 99  --  99  --  97* 100 98  --  102 99  --   CO2 22   < > 23   < > 23   < > 22   < > 24   < > 22   < > 25  --  23  --   --   --  '25 22 22  ' --  22 24  --   GLUCOSE 163*   < > 151*   < > 182*   < > 194*   < > 187*   < > 200*   < > 188*   < > 205*  --  166*  --  253* 201* 196*  --  120* 192*  --   BUN 130*   < > 97*   < >  70*   < > 58*   < > 49*   < > 47*   < > 43*   < > 44*  --  40*  --  40* 43* 38*  --  40* 36*  --   CREATININE 3.47*   < > 2.73*   < > 2.32*   < > 2.03*   < > 1.89*   < > 1.79*   < > 1.83*   < > 1.75*  --  1.70*  --  1.78* 1.86* 1.77*  --  1.76* 1.64*  --   ALBUMIN 2.0*   < > 2.0*   < > 2.0*   < > 2.0*   < > 2.0*   < > 2.0*   < > 2.0*  --  2.0*  --   --   --  1.9* 1.8* 2.2*  --  2.6* 2.4*  --   CALCIUM 7.5*   < > 7.4*   < > 7.8*   < > 7.7*   < > 7.9*   < > 7.6*   < > 7.9*  --  7.5*  --   --   --  7.6* 7.6* 7.5*  --  7.6* 7.7*  --   PHOS  --   --  4.6  --   --   --  3.3  --   --   --  2.3*  --   --   --  1.9*  --   --   --   --  2.2*  --   --  2.5 2.1*  --   AST 47*  --   --   --  47*  --   --   --  35  --   --   --  35  --   --   --   --   --  33  --  39  --   --   --   --   ALT 24  --   --   --  24  --   --   --  20  --   --   --  20  --   --   --   --   --  24  --  25  --   --   --   --    < > = values in this interval not displayed.   Liver Function Tests: Recent Labs  Lab 04/02/19 0349 04/02/19 1612 04/03/19 0548 04/03/19 1600 04/04/19 0355 04/04/19 1457 04/05/19 0505  AST 35  --  33  --  39  --   --   ALT 20  --  24  --  25  --   --   ALKPHOS 74  --  76  --  73  --   --   BILITOT 0.9  --  0.9  --  0.9  --   --   PROT 5.5*  --  5.3*  --  5.5*  --   --   ALBUMIN 2.0*   < > 1.9*   < > 2.2* 2.6* 2.4*   < > = values in this interval not displayed.   No results for input(s): LIPASE, AMYLASE in the last 168 hours. No results for input(s): AMMONIA in the last 168 hours. CBC: Recent Labs  Lab 04/03/19 0406 04/03/19 0501 04/03/19 1600 04/03/19 1600 04/04/19 0355 04/04/19 0418 04/04/19 1457 04/05/19 0505 04/05/19 0540  WBC 32.3*   < > 36.1*   < > 25.8*  --  17.8* 17.3*  --   HGB 8.9*   < > 8.4*   < > 6.8*   < > 8.6* 8.3* 7.8*  HCT 28.2*   < > 26.9*   < > 21.9*   < > 26.7* 25.2* 23.0*  MCV 92.2  --  94.1  --  94.8  --  92.1 92.3  --   PLT 134*   < > 127*   < >  104*  --  80* 80*   --    < > = values in this interval not displayed.   Cardiac Enzymes: No results for input(s): CKTOTAL, CKMB, CKMBINDEX, TROPONINI in the last 168 hours. CBG: Recent Labs  Lab 04/04/19 1209 04/04/19 1518 04/04/19 1957 04/04/19 2356 04/05/19 0519  GLUCAP 142* 119* 113* 146* 184*    Iron Studies: No results for input(s): IRON, TIBC, TRANSFERRIN, FERRITIN in the last 72 hours. Studies/Results: DG Chest Port 1 View  Result Date: 04/05/2019 CLINICAL DATA:  History of CABG. EXAM: PORTABLE CHEST 1 VIEW COMPARISON:  Radiograph yesterday. FINDINGS: Tracheostomy tube tip at the thoracic inlet. 2 enteric tubes remain in place with tip below the diaphragm. Right upper extremity PICC and left subclavian central line remain in place. Right subclavian Impella unchanged in positioning. Post median sternotomy. Unchanged cardiomegaly post CABG. Left atrial clipping. Hazy bilateral lung opacities, left greater than right, likely related to pleural effusion and adjacent atelectasis. Stable pulmonary vasculature. No new airspace disease. No pneumothorax. IMPRESSION: 1. Stable support apparatus. 2. Stable hazy bilateral lower lung zone opacities likely combination of pleural fluid and atelectasis, left greater than right. Electronically Signed   By: Keith Rake M.D.   On: 04/05/2019 05:22   DG CHEST PORT 1 VIEW  Result Date: 04/04/2019 CLINICAL DATA:  Tracheostomy tube and ventilator EXAM: PORTABLE CHEST 1 VIEW COMPARISON:  Yesterday FINDINGS: Impella in stable position. Postoperative heart with CABG and left atrial clipping. Heart size is normal and stable. Feeding tube reaches diaphragm. The orogastric tube is more difficult to separately visualized at the level of the diaphragm, likely due to feeding tube overlap. Right PICC and left subclavian line with tips near the upper cavoatrial junction. Tracheostomy tube in place. Haziness of the left lower chest attributed atelectasis and pleural fluid. No Kerley  lines. No visible pneumothorax IMPRESSION: 1. Stable hardware positioning as described. 2. Haziness at the left based attributed atelectasis and pleural fluid. Electronically Signed   By: Monte Fantasia M.D.   On: 04/04/2019 09:04   . sodium chloride   Intravenous Once  . aspirin  81 mg Per Tube Daily  . atorvastatin  80 mg Per Tube q1800  . B-complex with vitamin C  1 tablet Oral Daily  . bisacodyl  10 mg Oral Daily   Or  . bisacodyl  10 mg Rectal Daily  . chlorhexidine gluconate (MEDLINE KIT)  15 mL Mouth Rinse BID  . Chlorhexidine Gluconate Cloth  6 each Topical Daily  . docusate  200 mg Per Tube Daily  . feeding supplement (PRO-STAT SUGAR FREE 64)  60 mL Per Tube BID  . Gerhardt's butt cream   Topical BID  . insulin aspart  0-24 Units Subcutaneous Q4H  . insulin aspart  5 Units Subcutaneous Q4H  . insulin glargine  26 Units Subcutaneous Daily  . mouth rinse  15 mL Mouth Rinse 10 times per day  . midodrine  10 mg Oral TID WC  . pantoprazole (PROTONIX) IV  40 mg Intravenous Q24H  . sodium chloride flush  10-40 mL Intracatheter Q12H  . tamsulosin  0.4 mg Oral Daily    BMET    Component Value Date/Time   NA 135 04/05/2019 0540   K 4.3 04/05/2019 0540   CL 99 04/05/2019 0505   CO2 24 04/05/2019 0505   GLUCOSE 192 (H) 04/05/2019 0505   BUN 36 (H) 04/05/2019 0505   CREATININE 1.64 (H) 04/05/2019 0505   CALCIUM 7.7 (L) 04/05/2019 0505  GFRNONAA 37 (L) 04/05/2019 0505   GFRAA 43 (L) 04/05/2019 0505   CBC    Component Value Date/Time   WBC 17.3 (H) 04/05/2019 0505   RBC 2.73 (L) 04/05/2019 0505   HGB 7.8 (L) 04/05/2019 0540   HCT 23.0 (L) 04/05/2019 0540   PLT 80 (L) 04/05/2019 0505   MCV 92.3 04/05/2019 0505   MCH 30.4 04/05/2019 0505   MCHC 32.9 04/05/2019 0505   RDW 17.1 (H) 04/05/2019 0505   LYMPHSABS 0.6 (L) 03/13/2019 1056   MONOABS 0.5 03/13/2019 1056   EOSABS 0.1 03/13/2019 1056   BASOSABS 0.0 03/13/2019 1056    Assessment/Plan:  1. AKI(Cr was 0.7 prior  to surgery)- due to postoperative CHF/cardiogenic shock that was refractory to diuretics and started on CRRT on 03/29/19 for volume removal. Tolerating CRRT with volume removal of 200 ml/hr. UF limited by low BP but is down 9 kg since starting CRRT. 1. Unable to UF over the last 24 hours due to hypotension (had episode of a fib with rvr). 2. Would not offer more CVVHD by the end of this week if he does not show any significant improvement as he is not a longterm dialysis candidate and CVVHD was only initiated to improve his volume status. 3. Recommend palliative care consult to help set goals/limits of care. 2. Acute systolic CHF/cardiogenic shock- EF 25-30% currently on Impella. Continue with CVVHD and UF as tolerated. 3. CAD s/p CABG on 4/69/62 complicated by cardiogenic shock and ARF. 4. Acute hypoxic respiratory failure with pseudomonas pneumonia- s/p trach on 03/27/19. Cont with fortaz per PCCM 5. PAF- s/p MAZE and LAA occlusion- on amio per CHF team 6. Hematuria- bladder irrigation 7. ABLA- Has Melena. Transfuse as needed. Received 2 units PRBC's overnight with appropriate increase in Hgb from 7.6 to 10.2. Continue to follow H/H and transfuse prn.  8. Protein malnutrition- moderate to severe 9. Hypophosphatemia- dropping again and will replete and follow. 10. Vascular access- left [place] [facility] catheter placed 03/29/19 11. Disposition- poor overall prognosis. He is not a longterm dialysis candidate given his cardiogenic shock and trach. Recommend palliative care consult to help set goals/limits of care.  Donetta Potts, MD [employer] [phone]

## 2019-04-05 NOTE — Progress Notes (Addendum)
[facility] DAILY ICU PROGRESS NOTE                   [address]            [place] [address]          [phone]   9 Days Post-Op Procedure(s) (LRB): TRACHEOSTOMY placed using Shiley 8DCT Cuffed. (N/A) Placement Of Impella Left Ventricular Assist Device using ABIOMED Impella 5.5 with SmartAssist Device. (Right)  Total Length of Stay:  LOS: 22 days   Subjective: Slow but steady progress Levo being weaned Trach collar weaning initiated Less hematuria  Objective: Vital signs in last 24 hours: Temp:  [97.7 F (36.5 C)-98.3 F (36.8 C)] 97.9 F (36.6 C) (02/18 0740) Pulse Rate:  [67-81] 73 (02/18 0740) Cardiac Rhythm: Normal sinus rhythm (02/17 2000) Resp:  [14-32] 27 (02/18 0740) BP: (127)/(46) 127/46 (02/17 1542) SpO2:  [80 %-100 %] 98 % (02/18 0740) Arterial Line BP: (85-151)/(42-103) 118/49 (02/18 0740) FiO2 (%):  [40 %] 40 % (02/18 0353) Weight:  [92 kg] 92 kg (02/18 0450)  Filed Weights   04/03/19 0500 04/04/19 0500 04/05/19 0450  Weight: 95.1 kg 91.4 kg 92 kg    Weight change: 0.6 kg   Hemodynamic parameters for last 24 hours: CVP:  [3 mmHg-16 mmHg] 8 mmHg CO:  [5.7 L/min] 5.7 L/min CI:  [2.6 L/min/m2] 2.6 L/min/m2  Intake/Output from previous day: 02/17 0701 - 02/18 0700 In: 3523.2 [I.V.:1795.4; Blood:630; NG/GT:476; IV Piggyback:334.9] Out: 3919   Intake/Output this shift: No intake/output data recorded.  Current Meds: Scheduled Meds: . aspirin  81 mg Per Tube Daily  . atorvastatin  80 mg Per Tube q1800  . B-complex with vitamin C  1 tablet Oral Daily  . bisacodyl  10 mg Oral Daily   Or  . bisacodyl  10 mg Rectal Daily  . chlorhexidine gluconate (MEDLINE KIT)  15 mL Mouth Rinse BID  . Chlorhexidine Gluconate Cloth  6 each Topical Daily  . docusate  200 mg Per Tube Daily  . feeding supplement (PRO-STAT SUGAR FREE 64)  60 mL Per Tube BID  . Gerhardt's butt cream   Topical BID  . insulin aspart  0-24 Units Subcutaneous Q4H  .  insulin aspart  5 Units Subcutaneous Q4H  . insulin glargine  26 Units Subcutaneous Daily  . mouth rinse  15 mL Mouth Rinse 10 times per day  . midodrine  10 mg Oral TID WC  . pantoprazole (PROTONIX) IV  40 mg Intravenous Q24H  . sodium chloride flush  10-40 mL Intracatheter Q12H  . tamsulosin  0.4 mg Oral Daily   Continuous Infusions: .  prismasol BGK 4/2.5 400 mL/hr at 04/04/19 2206  .  prismasol BGK 4/2.5 200 mL/hr at 04/05/19 0529  . sodium chloride    . sodium chloride Stopped (04/04/19 1736)  . sodium chloride 10 mL/hr at 03/29/19 2128  . amiodarone 60 mg/hr (04/05/19 0700)  . feeding supplement (VITAL 1.5 CAL) 1,000 mL (04/04/19 1755)  . impella catheter heparin 25 unit/mL in dextrose 5%    . norepinephrine (LEVOPHED) Adult infusion 13 mcg/min (04/05/19 0700)  . prismasol BGK 4/2.5 1,500 mL/hr at 04/05/19 0637  . vancomycin Stopped (04/04/19 1840)  . vasopressin (PITRESSIN) infusion - *FOR SHOCK* 0.03 Units/min (04/05/19 0700)   PRN Meds:.sodium chloride, oxyCODONE **AND** acetaminophen, dextrose, docusate, fentaNYL (SUBLIMAZE) injection, heparin, heparin, midazolam, ondansetron (ZOFRAN) IV, sodium chloride flush, sodium chloride flush  General appearance: alert, cooperative and no distress Heart:   regular rate and rhythm Lungs: coarse anteriorly Abdomen: soft, nontender Extremities: edema conts to improve Wound: incis healing well  Lab Results: CBC: Recent Labs    04/04/19 1457 04/04/19 1457 04/05/19 0505 04/05/19 0540  WBC 17.8*  --  17.3*  --   HGB 8.6*   < > 8.3* 7.8*  HCT 26.7*   < > 25.2* 23.0*  PLT 80*  --  80*  --    < > = values in this interval not displayed.   BMET:  Recent Labs    04/04/19 1457 04/04/19 1457 04/05/19 0505 04/05/19 0540  NA 134*   < > 134* 135  K 4.3   < > 4.3 4.3  CL 102  --  99  --   CO2 22  --  24  --   GLUCOSE 120*  --  192*  --   BUN 40*  --  36*  --   CREATININE 1.76*  --  1.64*  --   CALCIUM 7.6*  --  7.7*  --    < >  = values in this interval not displayed.    CMET: Lab Results  Component Value Date   WBC 17.3 (H) 04/05/2019   HGB 7.8 (L) 04/05/2019   HCT 23.0 (L) 04/05/2019   PLT 80 (L) 04/05/2019   GLUCOSE 192 (H) 04/05/2019   CHOL 117 03/16/2019   TRIG 52 03/16/2019   HDL 33 (L) 03/16/2019   LDLCALC 74 03/16/2019   ALT 25 04/04/2019   AST 39 04/04/2019   NA 135 04/05/2019   K 4.3 04/05/2019   CL 99 04/05/2019   CREATININE 1.64 (H) 04/05/2019   BUN 36 (H) 04/05/2019   CO2 24 04/05/2019   TSH 3.683 03/13/2019   INR 1.7 (H) 03/27/2019   HGBA1C 6.1 (H) 03/13/2019      PT/INR: No results for input(s): LABPROT, INR in the last 72 hours. Radiology: DG Chest Port 1 View  Result Date: 04/05/2019 CLINICAL DATA:  History of CABG. EXAM: PORTABLE CHEST 1 VIEW COMPARISON:  Radiograph yesterday. FINDINGS: Tracheostomy tube tip at the thoracic inlet. 2 enteric tubes remain in place with tip below the diaphragm. Right upper extremity PICC and left subclavian central line remain in place. Right subclavian Impella unchanged in positioning. Post median sternotomy. Unchanged cardiomegaly post CABG. Left atrial clipping. Hazy bilateral lung opacities, left greater than right, likely related to pleural effusion and adjacent atelectasis. Stable pulmonary vasculature. No new airspace disease. No pneumothorax. IMPRESSION: 1. Stable support apparatus. 2. Stable hazy bilateral lower lung zone opacities likely combination of pleural fluid and atelectasis, left greater than right. Electronically Signed   By: Melanie  Sanford M.D.   On: 04/05/2019 05:22     Assessment/Plan: S/P Procedure(s) (LRB): TRACHEOSTOMY placed using Shiley 8DCT Cuffed. (N/A) Placement Of Impella Left Ventricular Assist Device using ABIOMED Impella 5.5 with SmartAssist Device. (Right)  1 doing better overall but remains critical 2 vent wean/TC trials as per PCCM 3 sinus rhythm- on amio 4 impella wean soon, wean levo, Co-Ox is 62.15 anemia  slightly worse- cont to monitor closely , hematuria improving with irrigation 6 volume conts to improve with CRRT 7 on Vanco, leukocytosis is stable, no fevers 8 thrombocytopenia stable- not at transfusion threshold 9 BS adeq controlled 10 CXR appearance is stable  Wayne E Gold 04/05/2019 8:06 AM   improvement in volume status,pressor requirements, pulmonary status, GI and bladder bleeding [ off heparin] Transfuse for Hb 7.7 Resume low dose peripheral heparin today -   400 u/hr patient examined and medical record reviewed,agree with above note.  Van Trigt III 04/05/2019   

## 2019-04-05 NOTE — Progress Notes (Signed)
[place] for Anticoagulation management/assistance  Indication: Impella 5.5  No Known Allergies  Patient Measurements: Height: 5\' 8"  (172.7 cm) Weight: 202 lb 13.2 oz (92 kg) IBW/kg (Calculated) : 68.4 Heparin Dosing Weight: 86kg  Vital Signs: Temp: 98.1 F (36.7 C) (02/18 1014) Temp Source: Oral (02/18 1014) Pulse Rate: 75 (02/18 1030)  Labs: Recent Labs    04/03/19 0406 04/03/19 0501 04/03/19 1600 04/03/19 1618 04/04/19 0355 04/04/19 0418 04/04/19 1457 04/04/19 1457 04/05/19 0505 04/05/19 0540  HGB 8.9*   < >   < >  --  6.8*   < > 8.6*   < > 8.3* 7.8*  HCT 28.2*   < >   < >  --  21.9*   < > 26.7*  --  25.2* 23.0*  PLT 134*   < >   < >  --  104*  --  80*  --  80*  --   APTT 90*  --   --   --  99*  --   --   --  45*  --   HEPARINUNFRC  --    < >  --  0.22* 0.13*  --   --   --  <0.10*  --   CREATININE  --    < >   < >  --  1.77*  --  1.76*  --  1.64*  --    < > = values in this interval not displayed.    Estimated Creatinine Clearance: 34.3 mL/min (A) (by C-G formula based on SCr of 1.64 mg/dL (H)).  Assessment: 86 year old male s/p CABG 1/29 with MAZE and LAA. Patient taken to OR 2/9 for tracheostomy and impella 5.5 placement.   Purge (25 units/ml of heparin) is running at 13.8 ml/hr, which provides 345 units/hr of heparin, and Dr. Prescott Gum added 400 units/hr of systemic heparin .  Goal of Therapy:  Heparin level ~0.3 units/mL per Dr. VT Monitor platelets by anticoagulation protocol: Yes   Plan:   Continue 1/2 strength heparin purge per MD Continue systemic heparin at 400 units/hr  Check heparin level 8 hrs after systemic heparin added this AM. Monitor CBC, s/sx bleeding  Marguerite Olea, [facility] Clinical Pharmacist Phone [phone]  04/05/2019 12:33 PM

## 2019-04-05 NOTE — Progress Notes (Signed)
PT placed on 40% ATC for wean. Pt is tolerating well, RT will continue to monitor.

## 2019-04-05 NOTE — Progress Notes (Signed)
9 Days Post-Op  Subjective: The urine is slightly blood tinged on moderate  CBI.  His nurse reports no further clots or obstruction.  Bladder scan shows no PVR.  ROS:  Review of Systems  Unable to perform ROS: Intubated    Anti-infectives: Anti-infectives (From admission, onward)   Start     Dose/Rate Route Frequency Ordered Stop   04/04/19 1700  vancomycin (VANCOCIN) IVPB 1000 mg/200 mL premix     1,000 mg 200 mL/hr over 60 Minutes Intravenous Every 24 hours 04/04/19 1612     04/04/19 1000  vancomycin (VANCOREADY) IVPB 1250 mg/250 mL  Status:  Discontinued     1,250 mg 166.7 mL/hr over 90 Minutes Intravenous Every 24 hours 04/03/19 0925 04/04/19 1009   04/03/19 0930  vancomycin (VANCOREADY) IVPB 2000 mg/400 mL     2,000 mg 200 mL/hr over 120 Minutes Intravenous  Once 04/03/19 0925 04/03/19 1528   03/29/19 2200  cefTAZidime (FORTAZ) 2 g in sodium chloride 0.9 % 100 mL IVPB     2 g 200 mL/hr over 30 Minutes Intravenous Every 12 hours 03/29/19 1256 04/03/19 2244   03/28/19 1100  cefTAZidime (FORTAZ) 1 g in sodium chloride 0.9 % 100 mL IVPB  Status:  Discontinued     1 g 200 mL/hr over 30 Minutes Intravenous Every 24 hours 03/28/19 0950 03/28/19 0950   03/28/19 1100  cefTAZidime (FORTAZ) 2 g in sodium chloride 0.9 % 100 mL IVPB  Status:  Discontinued     2 g 200 mL/hr over 30 Minutes Intravenous Every 24 hours 03/28/19 0950 03/28/19 0951   03/28/19 1100  cefTAZidime (FORTAZ) 2 g in sodium chloride 0.9 % 100 mL IVPB  Status:  Discontinued     2 g 200 mL/hr over 30 Minutes Intravenous Every 24 hours 03/28/19 0952 03/29/19 1256   03/26/19 1200  ceFEPIme (MAXIPIME) 2 g in sodium chloride 0.9 % 100 mL IVPB  Status:  Discontinued     2 g 200 mL/hr over 30 Minutes Intravenous Daily 03/26/19 1104 03/28/19 0950   03/19/19 1000  cefTRIAXone (ROCEPHIN) 1 g in sodium chloride 0.9 % 100 mL IVPB     1 g 200 mL/hr over 30 Minutes Intravenous Daily 03/19/19 0810 03/25/19 1117   03/16/19 2230   vancomycin (VANCOCIN) IVPB 1000 mg/200 mL premix     1,000 mg 200 mL/hr over 60 Minutes Intravenous  Once 03/16/19 1721 03/17/19 0024   03/16/19 1830  cefUROXime (ZINACEF) 1.5 g in sodium chloride 0.9 % 100 mL IVPB     1.5 g 200 mL/hr over 30 Minutes Intravenous Every 12 hours 03/16/19 1721 03/18/19 0557   03/16/19 0400  vancomycin (VANCOREADY) IVPB 1500 mg/300 mL  Status:  Discontinued     1,500 mg 150 mL/hr over 120 Minutes Intravenous To Surgery 03/15/19 1425 03/16/19 1715   03/16/19 0400  cefUROXime (ZINACEF) 1.5 g in sodium chloride 0.9 % 100 mL IVPB  Status:  Discontinued     1.5 g 200 mL/hr over 30 Minutes Intravenous To Surgery 03/15/19 1425 03/16/19 1715   03/16/19 0400  cefUROXime (ZINACEF) 750 mg in sodium chloride 0.9 % 100 mL IVPB     750 mg 200 mL/hr over 30 Minutes Intravenous To Surgery 03/15/19 1425 03/16/19 1540      Current Facility-Administered Medications  Medication Dose Route Frequency Provider Last Rate Last Admin  .  prismasol BGK 4/2.5 infusion   CRRT Continuous Elmarie Shiley, MD 400 mL/hr at 04/04/19 2206 New Bag at 04/04/19 2206  .  prismasol BGK 4/2.5 infusion   CRRT Continuous Elmarie Shiley, MD 200 mL/hr at 04/05/19 0529 New Bag at 04/05/19 0529  . 0.45 % sodium chloride infusion   Intravenous Continuous PRN Conte, Tessa N, PA-C      . 0.45 % sodium chloride infusion   Intravenous Continuous Ivin Poot, MD   Stopped at 04/04/19 1736  . 0.9 %  sodium chloride infusion   Intravenous Continuous Ivin Poot, MD 10 mL/hr at 03/29/19 2128 New Bag at 03/29/19 2128  . oxyCODONE (ROXICODONE) 5 MG/5ML solution 5 mg  5 mg Oral Q4H PRN Prescott Gum, Collier Salina, MD   5 mg at 04/04/19 1743   And  . acetaminophen (TYLENOL) 160 MG/5ML solution 325 mg  325 mg Oral Q4H PRN Ivin Poot, MD   325 mg at 03/31/19 1735  . amiodarone (NEXTERONE PREMIX) 360-4.14 MG/200ML-% (1.8 mg/mL) IV infusion  60 mg/hr Intravenous Continuous Clegg, Amy D, NP 33.3 mL/hr at 04/05/19 0700 60 mg/hr  at 04/05/19 0700  . aspirin chewable tablet 81 mg  81 mg Per Tube Daily Ivin Poot, MD   81 mg at 04/04/19 0841  . atorvastatin (LIPITOR) tablet 80 mg  80 mg Per Tube q1800 Prescott Gum, Collier Salina, MD   80 mg at 04/04/19 1733  . B-complex with vitamin C tablet 1 tablet  1 tablet Oral Daily Ivin Poot, MD   1 tablet at 04/04/19 1216  . bisacodyl (DULCOLAX) EC tablet 10 mg  10 mg Oral Daily Elgie Collard, [place]   Stopped at 03/25/19 0630   Or  . bisacodyl (DULCOLAX) suppository 10 mg  10 mg Rectal Daily Nicholes Rough N, PA-C   10 mg at 03/21/19 1000  . chlorhexidine gluconate (MEDLINE KIT) (PERIDEX) 0.12 % solution 15 mL  15 mL Mouth Rinse BID Prescott Gum, Collier Salina, MD   15 mL at 04/04/19 0840  . Chlorhexidine Gluconate Cloth 2 % PADS 6 each  6 each Topical Daily Ivin Poot, MD   6 each at 04/05/19 [phone]  . dextrose 50 % solution 0-50 mL  0-50 mL Intravenous PRN Conte, Tessa N, PA-C      . docusate (COLACE) 50 MG/5ML liquid 100 mg  100 mg Per Tube BID PRN Prescott Gum, Collier Salina, MD      . docusate (COLACE) 50 MG/5ML liquid 200 mg  200 mg Per Tube Daily Prescott Gum, Collier Salina, MD   200 mg at 04/01/19 0939  . feeding supplement (PRO-STAT SUGAR FREE 64) liquid 60 mL  60 mL Per Tube BID Ivin Poot, MD   60 mL at 04/04/19 2206  . feeding supplement (VITAL 1.5 CAL) liquid 1,000 mL  1,000 mL Per Tube Continuous Ivin Poot, MD 50 mL/hr at 04/04/19 1755 1,000 mL at 04/04/19 1755  . fentaNYL (SUBLIMAZE) injection 50 mcg  50 mcg Intravenous Q2H PRN Ivin Poot, MD   50 mcg at 04/04/19 1238  . Gerhardt's butt cream   Topical BID Ivin Poot, MD   Given at 04/04/19 2207  . heparin 25,000 Units in dextrose 5 % 1,000 mL (25 Units/mL)  25,000 Units Intracatheter Continuous Einar Grad, RPH   25,000 Units at 03/31/19 0932  . heparin bolus via infusion syringe 1,000 Units  1,000 Units CRRT PRN Roney Jaffe, MD      . heparinized saline (2000 units/L) primer fluid for CRRT   CRRT PRN Elmarie Shiley,  MD   Given at 03/30/19 [phone]  . insulin  aspart (novoLOG) injection 0-24 Units  0-24 Units Subcutaneous Q4H Lajuana Matte, MD   4 Units at 04/05/19 0500  . insulin aspart (novoLOG) injection 5 Units  5 Units Subcutaneous Q4H Ollis, Brandi L, NP   5 Units at 04/05/19 0500  . insulin glargine (LANTUS) injection 26 Units  26 Units Subcutaneous Daily Ollis, Brandi L, NP   26 Units at 04/04/19 1213  . MEDLINE mouth rinse  15 mL Mouth Rinse 10 times per day Ivin Poot, MD   15 mL at 04/05/19 0506  . midazolam (VERSED) injection 1 mg  1 mg Intravenous Q6H PRN Ivin Poot, MD   1 mg at 03/31/19 1009  . midodrine (PROAMATINE) tablet 10 mg  10 mg Oral TID WC Kipp Brood, MD   10 mg at 04/04/19 1733  . norepinephrine (LEVOPHED) 16 mg in 244m premix infusion  0-40 mcg/min Intravenous Titrated Agarwala, Ravi, MD 12.19 mL/hr at 04/05/19 0700 13 mcg/min at 04/05/19 0700  . ondansetron (ZOFRAN) injection 4 mg  4 mg Intravenous Q6H PRN CElgie Collard PA-C   4 mg at 03/30/19 1115  . pantoprazole (PROTONIX) injection 40 mg  40 mg Intravenous Q24H VPrescott Gum PCollier Salina MD   40 mg at 04/04/19 1215  . prismasol BGK 4/2.5 infusion   CRRT Continuous PElmarie Shiley MD 1,500 mL/hr at 04/05/19 0637 New Bag at 04/05/19 03825 . sodium chloride flush (NS) 0.9 % injection 10-40 mL  10-40 mL Intracatheter PRN VIvin Poot MD   15 mL at 04/04/19 1242  . sodium chloride flush (NS) 0.9 % injection 10-40 mL  10-40 mL Intracatheter Q12H VIvin Poot MD   10 mL at 04/04/19 2207  . sodium chloride flush (NS) 0.9 % injection 3 mL  3 mL Intravenous PRN CHarriet Pho Tessa N, PA-C      . tamsulosin (FLOMAX) capsule 0.4 mg  0.4 mg Oral Daily PElmarie Shiley MD   0.4 mg at 04/04/19 0841  . vancomycin (VANCOCIN) IVPB 1000 mg/200 mL premix  1,000 mg Intravenous Q24H [facility]  Stopped at 04/04/19 1840  . vasopressin (PITRESSIN) 40 Units in sodium chloride 0.9 % 250 mL (0.16 Units/mL) infusion  0-0.04 Units/min  Intravenous Continuous Agarwala, Ravi, MD 11.25 mL/hr at 04/05/19 0700 0.03 Units/min at 04/05/19 0700     Objective: Vital signs in last 24 hours: Temp:  [97.7 F (36.5 C)-98.3 F (36.8 C)] 97.9 F (36.6 C) (02/18 0740) Pulse Rate:  [67-81] 73 (02/18 0740) Resp:  [14-32] 27 (02/18 0740) BP: (127)/(46) 127/46 (02/17 1542) SpO2:  [80 %-100 %] 98 % (02/18 0740) Arterial Line BP: (85-151)/(42-103) 118/49 (02/18 0740) FiO2 (%):  [40 %] 40 % (02/18 0353) Weight:  [92 kg] 92 kg (02/18 0450)  Intake/Output from previous day: 02/17 0701 - 02/18 0700 In: 3523.2 [I.V.:1795.4; Blood:630; NG/GT:476; IV Piggyback:334.9] Out: 3919  Intake/Output this shift: Total I/O In: -  Out: 219 [Other:219]   Physical Exam  Lab Results:  Recent Labs    04/04/19 1457 04/04/19 1457 04/05/19 0505 04/05/19 0540  WBC 17.8*  --  17.3*  --   HGB 8.6*   < > 8.3* 7.8*  HCT 26.7*   < > 25.2* 23.0*  PLT 80*  --  80*  --    < > = values in this interval not displayed.   BMET Recent Labs    04/04/19 1457 04/04/19 1457 04/05/19 0505 04/05/19 0540  NA 134*   < > 134*  135  K 4.3   < > 4.3 4.3  CL 102  --  99  --   CO2 22  --  24  --   GLUCOSE 120*  --  192*  --   BUN 40*  --  36*  --   CREATININE 1.76*  --  1.64*  --   CALCIUM 7.6*  --  7.7*  --    < > = values in this interval not displayed.   PT/INR No results for input(s): LABPROT, INR in the last 72 hours. ABG Recent Labs    04/04/19 0418 04/05/19 0540  PHART 7.444 7.429  HCO3 22.0 23.3    Studies/Results: DG Chest Port 1 View  Result Date: 04/05/2019 CLINICAL DATA:  History of CABG. EXAM: PORTABLE CHEST 1 VIEW COMPARISON:  Radiograph yesterday. FINDINGS: Tracheostomy tube tip at the thoracic inlet. 2 enteric tubes remain in place with tip below the diaphragm. Right upper extremity PICC and left subclavian central line remain in place. Right subclavian Impella unchanged in positioning. Post median sternotomy. Unchanged cardiomegaly  post CABG. Left atrial clipping. Hazy bilateral lung opacities, left greater than right, likely related to pleural effusion and adjacent atelectasis. Stable pulmonary vasculature. No new airspace disease. No pneumothorax. IMPRESSION: 1. Stable support apparatus. 2. Stable hazy bilateral lower lung zone opacities likely combination of pleural fluid and atelectasis, left greater than right. Electronically Signed   By: Keith Rake M.D.   On: 04/05/2019 05:22   DG CHEST PORT 1 VIEW  Result Date: 04/04/2019 CLINICAL DATA:  Tracheostomy tube and ventilator EXAM: PORTABLE CHEST 1 VIEW COMPARISON:  Yesterday FINDINGS: Impella in stable position. Postoperative heart with CABG and left atrial clipping. Heart size is normal and stable. Feeding tube reaches diaphragm. The orogastric tube is more difficult to separately visualized at the level of the diaphragm, likely due to feeding tube overlap. Right PICC and left subclavian line with tips near the upper cavoatrial junction. Tracheostomy tube in place. Haziness of the left lower chest attributed atelectasis and pleural fluid. No Kerley lines. No visible pneumothorax IMPRESSION: 1. Stable hardware positioning as described. 2. Haziness at the left based attributed atelectasis and pleural fluid. Electronically Signed   By: Monte Fantasia M.D.   On: 04/04/2019 09:04     Assessment and Plan: Clot retention has resolved.  His urine is minimally blood tinged on CBI.   Wean from CBI and maintain foley drainage.       LOS: 22 days    Irine Seal 04/05/2019 [phone] ID: [name], male   DOB: 1928/05/18, 86 y.o.   MRN: [id number]

## 2019-04-05 NOTE — Progress Notes (Signed)
NAME:  Jesse Murphy, MRN:  [id number], DOB:  03/30/1928, LOS: 37 ADMISSION DATE:  03/13/2019, CONSULTATION DATE:  2/2 REFERRING MD:  Nils Pyle, CHIEF COMPLAINT:  Dyspnea   Brief History   86 y/o male admitted 1/26, underwent CABG and L atrial appendage clipping on 1/29, moved to ICU on 2/1 for intubation.  Past Medical History  A fib, CAD, Prostate cancer, HTN  Significant Hospital Events   1/29 CABG 2/01 to ICU 2/05 Extubated 2/08 Reintubated 2/09 tracheostomy 2/09 Impella, PA catheter placed 2/11 CRRT initiated 2/16 leukocytosis, add vancomycin 2/17 start TC trials  Consults:  Nephrology CHF team Urology  Procedures:  Rt PICC 2/02 >> Trach 2/09 >> PA catheter 2/09 >> Lt [place] HD cath 2/11 >> L Rad Aline 2/3 >>  Impella 2/10 >>  Significant Diagnostic Tests:  Echo 1/26 >> EF 30 to 35%  Micro Data:  SARS CoV2 PCR 1/26 >> negative Influenza PCR 1/26 >> negative Sputum 2/01 >> oral flora Sputum 2/08 >> Pseudomonas >> pan sensitive  Blood 2/16 >> Sputum 2/16 >> Few Pseudomonas  Antimicrobials:  Rocephin 2/01 >> 2/07 Cefepime 2/08 >> 2/10 Fortaz 2/10 >> 2/16 Vancomycin 2/16 >>   Interim history/subjective:  Denies chest pain.  Objective   Blood pressure (!) 127/46, pulse 73, temperature 97.9 F (36.6 C), temperature source Oral, resp. rate (!) 27, height 5\' 8"  (1.727 m), weight 92 kg, SpO2 98 %. CVP:  [3 mmHg-16 mmHg] 8 mmHg CO:  [5.7 L/min] 5.7 L/min CI:  [2.6 L/min/m2] 2.6 L/min/m2  Vent Mode: PRVC FiO2 (%):  [40 %] 40 % Set Rate:  [18 bmp] 18 bmp Vt Set:  [540 mL] 540 mL PEEP:  [5 cmH20] 5 cmH20   Intake/Output Summary (Last 24 hours) at 04/05/2019 0803 Last data filed at 04/05/2019 0700 Gross per 24 hour  Intake 3364.02 ml  Output 3794 ml  Net -429.98 ml   Filed Weights   04/03/19 0500 04/04/19 0500 04/05/19 0450  Weight: 95.1 kg 91.4 kg 92 kg    Examination:  General - alert Eyes - pupils reactive ENT - trach site with tan  secretions Cardiac - regular rate/rhythm, no murmur Chest - b/l rhonchi Abdomen - soft, non tender, + bowel sounds Extremities -1+ edema Skin - no rashes Neuro - follows commands   Resolved Hospital Problem list     Assessment & Plan:   Acute Hypoxemic Respiratory Failure due to Acute pulmonary edema complicated by Pseudomonas HCAP. - wean to TC as able - prn BDs - f/u CXR intermittently  Cardiogenic Shock with acute systolic heart failure. - impella per TCTS and CHF team - continue ASA, lipitor, midodrine  Atrial fibrillation. - now in sinus  - continue amiodarone, heparin gtt  AKI secondary to Cardiogenic Shock. - CRRT per renal  Anemia of critical illness. - f/u CBC - transfuse for Hb < 7 or significant bleeding  Hematuria. - coude placed by urology  DM2 with hyperglycemia, poorly controlled - SSI with lantus and tube feed coverage  Acute metabolic encephalopathy due to shock, renal failure, hypoxemia - RASS goal 0  Pressure wounds / Reported as Skin Tear 2/15 -WOC following   Leukocytosis  - vancomycin per primary team - if spikes fever again, then consider restarting ABx to cover Pseudomonas  Best practice:  Diet: TF DVT prophylaxis: heparin infusion GI prophylaxis: PPI Mobility: bed rest Code Status: full Disposition: ICU  Labs    CMP Latest Ref Rng & Units 04/05/2019 04/05/2019 04/04/2019  Glucose 70 - 99 mg/dL - 192(H) 120(H)  BUN 8 - 23 mg/dL - 36(H) 40(H)  Creatinine 0.61 - 1.24 mg/dL - 1.64(H) 1.76(H)  Sodium 135 - 145 mmol/L 135 134(L) 134(L)  Potassium 3.5 - 5.1 mmol/L 4.3 4.3 4.3  Chloride 98 - 111 mmol/L - 99 102  CO2 22 - 32 mmol/L - 24 22  Calcium 8.9 - 10.3 mg/dL - 7.7(L) 7.6(L)  Total Protein 6.5 - 8.1 g/dL - - -  Total Bilirubin 0.3 - 1.2 mg/dL - - -  Alkaline Phos 38 - 126 U/L - - -  AST 15 - 41 U/L - - -  ALT 0 - 44 U/L - - -    CBC Latest Ref Rng & Units 04/05/2019 04/05/2019 04/04/2019  WBC 4.0 - 10.5 K/uL - 17.3(H)  17.8(H)  Hemoglobin 13.0 - 17.0 g/dL 7.8(L) 8.3(L) 8.6(L)  Hematocrit 39.0 - 52.0 % 23.0(L) 25.2(L) 26.7(L)  Platelets 150 - 400 K/uL - 80(L) 80(L)    ABG    Component Value Date/Time   PHART 7.429 04/05/2019 0540   PCO2ART 35.1 04/05/2019 0540   PO2ART 75.0 (L) 04/05/2019 0540   HCO3 23.3 04/05/2019 0540   TCO2 24 04/05/2019 0540   ACIDBASEDEF 1.0 04/05/2019 0540   O2SAT 95.0 04/05/2019 0540    CBG (last 3)  Recent Labs    04/04/19 1957 04/04/19 2356 04/05/19 0519  GLUCAP 113* 146* 184*    CC time 33 minutes  Chesley Mires, MD [facility] 04/05/2019, 8:11 AM

## 2019-04-05 NOTE — Progress Notes (Signed)
TCTS Evening Rounds Events of today noted; doing well with CVVHD and did 4hr trach collar trial Impella providing 4L CO at P6 level In NSR rate 72 MAP 70 Remains of NE and vaso A/p: continue present supportive care. Faustino Luecke Z. Orvan Seen, [place]

## 2019-04-06 ENCOUNTER — Inpatient Hospital Stay (HOSPITAL_COMMUNITY): Payer: Medicare Other

## 2019-04-06 LAB — GLUCOSE, CAPILLARY
Glucose-Capillary: 117 mg/dL — ABNORMAL HIGH (ref 70–99)
Glucose-Capillary: 119 mg/dL — ABNORMAL HIGH (ref 70–99)
Glucose-Capillary: 139 mg/dL — ABNORMAL HIGH (ref 70–99)
Glucose-Capillary: 164 mg/dL — ABNORMAL HIGH (ref 70–99)
Glucose-Capillary: 167 mg/dL — ABNORMAL HIGH (ref 70–99)
Glucose-Capillary: 169 mg/dL — ABNORMAL HIGH (ref 70–99)
Glucose-Capillary: 229 mg/dL — ABNORMAL HIGH (ref 70–99)

## 2019-04-06 LAB — CBC WITH DIFFERENTIAL/PLATELET
Abs Immature Granulocytes: 0.2 10*3/uL — ABNORMAL HIGH (ref 0.00–0.07)
Basophils Absolute: 0 10*3/uL (ref 0.0–0.1)
Basophils Relative: 0 %
Eosinophils Absolute: 0.2 10*3/uL (ref 0.0–0.5)
Eosinophils Relative: 1 %
HCT: 26 % — ABNORMAL LOW (ref 39.0–52.0)
Hemoglobin: 8.4 g/dL — ABNORMAL LOW (ref 13.0–17.0)
Immature Granulocytes: 1 %
Lymphocytes Relative: 3 %
Lymphs Abs: 0.5 10*3/uL — ABNORMAL LOW (ref 0.7–4.0)
MCH: 29.6 pg (ref 26.0–34.0)
MCHC: 32.3 g/dL (ref 30.0–36.0)
MCV: 91.5 fL (ref 80.0–100.0)
Monocytes Absolute: 1.9 10*3/uL — ABNORMAL HIGH (ref 0.1–1.0)
Monocytes Relative: 12 %
Neutro Abs: 13.7 10*3/uL — ABNORMAL HIGH (ref 1.7–7.7)
Neutrophils Relative %: 83 %
Platelets: 74 10*3/uL — ABNORMAL LOW (ref 150–400)
RBC: 2.84 MIL/uL — ABNORMAL LOW (ref 4.22–5.81)
RDW: 17.4 % — ABNORMAL HIGH (ref 11.5–15.5)
WBC: 16.6 10*3/uL — ABNORMAL HIGH (ref 4.0–10.5)
nRBC: 0.2 % (ref 0.0–0.2)

## 2019-04-06 LAB — RENAL FUNCTION PANEL
Albumin: 2.3 g/dL — ABNORMAL LOW (ref 3.5–5.0)
Anion gap: 11 (ref 5–15)
BUN: 33 mg/dL — ABNORMAL HIGH (ref 8–23)
CO2: 24 mmol/L (ref 22–32)
Calcium: 7.7 mg/dL — ABNORMAL LOW (ref 8.9–10.3)
Chloride: 98 mmol/L (ref 98–111)
Creatinine, Ser: 1.77 mg/dL — ABNORMAL HIGH (ref 0.61–1.24)
GFR calc Af Amer: 39 mL/min — ABNORMAL LOW (ref >60)
GFR calc non Af Amer: 34 mL/min — ABNORMAL LOW (ref >60)
Glucose, Bld: 167 mg/dL — ABNORMAL HIGH (ref 70–99)
Phosphorus: 2.5 mg/dL (ref 2.5–4.6)
Potassium: 4.7 mmol/L (ref 3.5–5.1)
Sodium: 133 mmol/L — ABNORMAL LOW (ref 135–145)

## 2019-04-06 LAB — POCT I-STAT 7, (LYTES, BLD GAS, ICA,H+H)
Acid-Base Excess: 1 mmol/L (ref 0.0–2.0)
Bicarbonate: 24.5 mmol/L (ref 20.0–28.0)
Calcium, Ion: 1.1 mmol/L — ABNORMAL LOW (ref 1.15–1.40)
HCT: 28 % — ABNORMAL LOW (ref 39.0–52.0)
Hemoglobin: 9.5 g/dL — ABNORMAL LOW (ref 13.0–17.0)
O2 Saturation: 93 %
Patient temperature: 98.3
Potassium: 4.5 mmol/L (ref 3.5–5.1)
Sodium: 131 mmol/L — ABNORMAL LOW (ref 135–145)
TCO2: 26 mmol/L (ref 22–32)
pCO2 arterial: 34.6 mmHg (ref 32.0–48.0)
pH, Arterial: 7.458 — ABNORMAL HIGH (ref 7.350–7.450)
pO2, Arterial: 63 mmHg — ABNORMAL LOW (ref 83.0–108.0)

## 2019-04-06 LAB — COOXEMETRY PANEL
Carboxyhemoglobin: 1.3 % (ref 0.5–1.5)
Methemoglobin: 0.5 % (ref 0.0–1.5)
O2 Saturation: 77.1 %
Total hemoglobin: 8.7 g/dL — ABNORMAL LOW (ref 12.0–16.0)

## 2019-04-06 LAB — CULTURE, RESPIRATORY W GRAM STAIN

## 2019-04-06 LAB — CBC
HCT: 28 % — ABNORMAL LOW (ref 39.0–52.0)
Hemoglobin: 9 g/dL — ABNORMAL LOW (ref 13.0–17.0)
MCH: 29.3 pg (ref 26.0–34.0)
MCHC: 32.1 g/dL (ref 30.0–36.0)
MCV: 91.2 fL (ref 80.0–100.0)
Platelets: 83 10*3/uL — ABNORMAL LOW (ref 150–400)
RBC: 3.07 MIL/uL — ABNORMAL LOW (ref 4.22–5.81)
RDW: 17.9 % — ABNORMAL HIGH (ref 11.5–15.5)
WBC: 17.5 10*3/uL — ABNORMAL HIGH (ref 4.0–10.5)
nRBC: 0.2 % (ref 0.0–0.2)

## 2019-04-06 LAB — POCT ACTIVATED CLOTTING TIME: Activated Clotting Time: 0 seconds

## 2019-04-06 LAB — HEPARIN LEVEL (UNFRACTIONATED): Heparin Unfractionated: <0.1 IU/mL — ABNORMAL LOW (ref 0.30–0.70)

## 2019-04-06 LAB — APTT: aPTT: 55 seconds — ABNORMAL HIGH (ref 24–36)

## 2019-04-06 LAB — MAGNESIUM: Magnesium: 2.4 mg/dL (ref 1.7–2.4)

## 2019-04-06 LAB — LACTATE DEHYDROGENASE: LDH: 259 U/L — ABNORMAL HIGH (ref 98–192)

## 2019-04-06 MED ORDER — AMIODARONE IV BOLUS ONLY 150 MG/100ML
150.0000 mg | Freq: Once | INTRAVENOUS | Status: AC
  Administered 2019-04-06: 15:00:00 150 mg via INTRAVENOUS

## 2019-04-06 MED ORDER — ALBUMIN HUMAN 25 % IV SOLN
12.5000 g | Freq: Four times a day (QID) | INTRAVENOUS | Status: AC
  Administered 2019-04-06 – 2019-04-07 (×4): 12.5 g via INTRAVENOUS
  Filled 2019-04-06 (×4): qty 50

## 2019-04-06 MED ORDER — HYDROCORTISONE (PERIANAL) 2.5 % EX CREA
TOPICAL_CREAM | Freq: Two times a day (BID) | CUTANEOUS | Status: DC
  Administered 2019-04-06 – 2019-05-19 (×18): 1 via RECTAL
  Filled 2019-04-06 (×5): qty 28.35

## 2019-04-06 NOTE — Progress Notes (Signed)
Unable to UF without increasing pressors. MD aware. Orders given to keep pt even on CRRT.

## 2019-04-06 NOTE — Progress Notes (Signed)
PT Cancellation Note  Patient Details Name: Jesse Murphy MRN: [id number] DOB: 23-Mar-1928   Cancelled Treatment:    Reason Eval/Treat Not Completed: Fatigue/lethargy limiting ability to participate. Pt declining PT services at this time, fatigued after bladder irrigation this morning. PT encourages patient to perform HEP over the weekend to maintain LE strength. PT also educates the pt's son on HEP performance and son is willing to assist patient in HEP.   Zenaida Niece 04/06/2019, 2:33 PM

## 2019-04-06 NOTE — Progress Notes (Signed)
Urologist at the bedside  

## 2019-04-06 NOTE — Progress Notes (Signed)
10 Days Post-Op Procedure(s) (LRB): TRACHEOSTOMY placed using Shiley 8DCT Cuffed. (N/A) Placement Of Impella Left Ventricular Assist Device using ABIOMED Impella 5.5 with SmartAssist Device. (Right) Subjective: Urgent multivessel CABG with Maze procedure for ischemic cardiomyopathy with heart failure and angina, EF 25%  Postop respiratory insufficiency requiring controlled reintubation for airway secretions and tachypnea x2 followed by tracheostomy  Postoperative acute kidney disease from high-level diuresis now on CVVH.  Volume removal has been successful and he is now at his preoperative weight.  Postoperative improved EF by echo but marginal hemodynamics with recurrent atrial fibrillation requiring postop Impella 5.5 LVAD  Patient continues to improve.  Mental status intact.  Edema much improved and weight close to preoperative level.  He has maintained sinus rhythm for 48 hours with improved hemodynamics and decreased pressor support. He is now tolerating trach collar trials, chest x-ray is clear Finishing course of IV Fortaz for Pseudomonas positive sputum  Received 1 unit of packed cells for GI bleeding, hemoglobin stable at 9.5 today.  Some rectal bleeding today probably hemorrhoid  Postoperative bladder bleeding with clot with history of prostate cancer.  Three-way bladder irrigation in place followed by urology.  Objective: Vital signs in last 24 hours: Temp:  [97.9 F (36.6 C)-98.5 F (36.9 C)] 97.9 F (36.6 C) (02/19 0819) Pulse Rate:  [71-84] 84 (02/19 0825) Cardiac Rhythm: Normal sinus rhythm (02/19 0400) Resp:  [21-39] 29 (02/19 0825) BP: (112)/(52) 112/52 (02/19 0825) SpO2:  [95 %-100 %] 100 % (02/19 0825) Arterial Line BP: (71-151)/(42-84) 120/52 (02/19 0700) FiO2 (%):  [40 %] 40 % (02/19 0825)  Hemodynamic parameters for last 24 hours: CVP:  [4 mmHg-13 mmHg] 10 mmHg  Intake/Output from previous day: 02/18 0701 - 02/19 0700 In: 10931.8 [I.V.:1338.9; Blood:350;  NG/GT:1250; IV Piggyback:457.7] Out: 62952 [Urine:9300] Intake/Output this shift: No intake/output data recorded.  Alert and responsive on vent Breath sounds clear Extremities pink and warm No murmur Sinus rhythm Abdomen nontender  Lab Results: Recent Labs    04/05/19 0505 04/05/19 0540 04/06/19 0404 04/06/19 0424  WBC 17.3*  --  17.5*  --   HGB 8.3*   < > 9.0* 9.5*  HCT 25.2*   < > 28.0* 28.0*  PLT 80*  --  83*  --    < > = values in this interval not displayed.   BMET:  Recent Labs    04/05/19 1622 04/05/19 1622 04/06/19 0404 04/06/19 0424  NA 135   < > 133* 131*  K 4.6   < > 4.7 4.5  CL 101  --  98  --   CO2 24  --  24  --   GLUCOSE 134*  --  167*  --   BUN 35*  --  33*  --   CREATININE 1.73*  --  1.77*  --   CALCIUM 7.7*  --  7.7*  --    < > = values in this interval not displayed.    PT/INR: No results for input(s): LABPROT, INR in the last 72 hours. ABG    Component Value Date/Time   PHART 7.458 (H) 04/06/2019 0424   HCO3 24.5 04/06/2019 0424   TCO2 26 04/06/2019 0424   ACIDBASEDEF 1.0 04/05/2019 0540   O2SAT 77.1 04/06/2019 0430   CBG (last 3)  Recent Labs    04/06/19 0008 04/06/19 0419 04/06/19 0818  GLUCAP 139* 167* 119*    Assessment/Plan: S/P Procedure(s) (LRB): TRACHEOSTOMY placed using Shiley 8DCT Cuffed. (N/A) Placement Of Impella Left Ventricular Assist  Device using ABIOMED Impella 5.5 with SmartAssist Device. (Right) Consider transitioning off CVVH Wean Levophed as tolerated.  Cardiac output by core flow is 6 L/min Wean Impella support per cardiology-advanced heart failure Resume low-level heparin peripherally when rectal bleeding has been controlled  LOS: 23 days    Tharon Aquas Trigt III 04/06/2019

## 2019-04-06 NOTE — Progress Notes (Signed)
NAME:  Jesse Murphy, MRN:  [id number], DOB:  1928-05-14, LOS: 23 ADMISSION DATE:  03/13/2019, CONSULTATION DATE:  2/2 REFERRING MD:  Nils Pyle, CHIEF COMPLAINT:  Dyspnea   Brief History   86 y/o male admitted 1/26, underwent CABG and L atrial appendage clipping on 1/29, moved to ICU on 2/1 for intubation.  Past Medical History  A fib, CAD, Prostate cancer, HTN  Significant Hospital Events   1/29 CABG 2/01 to ICU 2/05 Extubated 2/08 Reintubated 2/09 tracheostomy 2/09 Impella, PA catheter placed 2/11 CRRT initiated 2/16 leukocytosis, add vancomycin 2/17 start TC trials 2/19 urology placed 3 way foley, bleeding at rectal tube site  Consults:  Nephrology CHF team Urology  Procedures:  Rt PICC 2/02 >> Trach 2/09 >> PA catheter 2/09 >> Lt [place] HD cath 2/11 >> L Rad Al[place] 2/3 >>  Impella 2/10 >>  Significant Diagnostic Tests:  Echo 1/26 >> EF 30 to 35%  Micro Data:  SARS CoV2 PCR 1/26 >> negative Influenza PCR 1/26 >> negative Sputum 2/01 >> oral flora Sputum 2/08 >> Pseudomonas >> pan sensitive  Blood 2/16 >> Sputum 2/16 >> Few Pseudomonas, Candida tropicalis  Antimicrobials:  Rocephin 2/01 >> 2/07 Cefepime 2/08 >> 2/10 Fortaz 2/10 >> 2/16 Vancomycin 2/16 >>   Interim history/subjective:  C/o discomfort at 3 way catheter site.  Feels more tired this morning after events of last night.  Rectal tube removed this AM.  Objective   Blood pressure (!) 127/46, pulse 81, temperature 98.3 F (36.8 C), temperature source Oral, resp. rate (!) 36, height 5\' 8"  (1.727 m), weight 92 kg, SpO2 98 %. CVP:  [4 mmHg-13 mmHg] 10 mmHg  Vent Mode: PRVC FiO2 (%):  [40 %] 40 % Set Rate:  [18 bmp] 18 bmp Vt Set:  [540 mL] 540 mL PEEP:  [5 cmH20] 5 cmH20 Plateau Pressure:  [24 cmH20] 24 cmH20   Intake/Output Summary (Last 24 hours) at 04/06/2019 0816 Last data filed at 04/06/2019 0700 Gross per 24 hour  Intake 10701.39 ml  Output 13983 ml  Net -3281.61 ml   Filed Weights   04/03/19 0500 04/04/19 0500 04/05/19 0450  Weight: 95.1 kg 91.4 kg 92 kg    Examination:  General - alert Eyes - pupils reactive ENT - trach site clean Cardiac - regular rate/rhythm, no murmur Chest - scattered rhonchi Abdomen - soft, non tender, + bowel sounds Extremities - 1+ edema Skin - no rashes Neuro - RASS 0   Resolved Hospital Problem list     Assessment & Plan:   Acute Hypoxemic Respiratory Failure due to Acute pulmonary edema complicated by Pseudomonas HCAP. - wean to TC as able with rest on PRVC as needed - prn BDs - f/u CXR intermittently  Cardiogenic Shock with acute systolic heart failure. New onset atrial fibrillation >> back in sinus rhythm. - impella, pressors per TCTS and CHF team - continue ASA, lipitor, midodrine, amiodarone - heparin gtt on hold in setting of hematuria and rectal bleeding  AKI secondary to Cardiogenic Shock. - CRRT per renal  Anemia of critical illness. - f/u CBC - transfuse for Hb < 7 or significant bleeding  Rectal bleeding. - noted 2/19 - likely from rectal tube irritation - rectal tube removed - monitor for further signs of bleeding  Hematuria with retained clots. - 3 way catheter with flushing by urology  DM2 with hyperglycemia, poorly controlled - SSI with lantus and tube feed coverge  Acute metabolic encephalopathy due to shock, renal failure, hypoxemia -  RASS goal 0  Pressure wounds / Reported as Skin Tear 2/15 -WOC following   Leukocytosis  - vancomycin per primary team - if spikes fever again, then consider restarting ABx to cover Pseudomonas  Best practice:  Diet: TF DVT prophylaxis: heparin infusion GI prophylaxis: PPI Mobility: bed rest Code Status: full Disposition: ICU  Labs    CMP Latest Ref Rng & Units 04/06/2019 04/06/2019 04/05/2019  Glucose 70 - 99 mg/dL - 167(H) 134(H)  BUN 8 - 23 mg/dL - 33(H) 35(H)  Creatinine 0.61 - 1.24 mg/dL - 1.77(H) 1.73(H)  Sodium 135 - 145 mmol/L 131(L) 133(L)  135  Potassium 3.5 - 5.1 mmol/L 4.5 4.7 4.6  Chloride 98 - 111 mmol/L - 98 101  CO2 22 - 32 mmol/L - 24 24  Calcium 8.9 - 10.3 mg/dL - 7.7(L) 7.7(L)  Total Protein 6.5 - 8.1 g/dL - - -  Total Bilirubin 0.3 - 1.2 mg/dL - - -  Alkaline Phos 38 - 126 U/L - - -  AST 15 - 41 U/L - - -  ALT 0 - 44 U/L - - -    CBC Latest Ref Rng & Units 04/06/2019 04/06/2019 04/05/2019  WBC 4.0 - 10.5 K/uL - 17.5(H) -  Hemoglobin 13.0 - 17.0 g/dL 9.5(L) 9.0(L) 7.8(L)  Hematocrit 39.0 - 52.0 % 28.0(L) 28.0(L) 23.0(L)  Platelets 150 - 400 K/uL - 83(L) -    ABG    Component Value Date/Time   PHART 7.458 (H) 04/06/2019 0424   PCO2ART 34.6 04/06/2019 0424   PO2ART 63.0 (L) 04/06/2019 0424   HCO3 24.5 04/06/2019 0424   TCO2 26 04/06/2019 0424   ACIDBASEDEF 1.0 04/05/2019 0540   O2SAT 77.1 04/06/2019 0430    CBG (last 3)  Recent Labs    04/05/19 1950 04/06/19 0008 04/06/19 [place], MD [place] 04/06/2019, 8:16 AM

## 2019-04-06 NOTE — Progress Notes (Signed)
RN stated that when patient was on trach collar earlier he went into rapid A-fib.  Will leave patient on full vent support for the night.  Can try again tomorrow morning.

## 2019-04-06 NOTE — Progress Notes (Signed)
      [address]       [place] [address]             [phone]      No new events  BP (!) 123/40   Pulse 78   Temp 97.7 F (36.5 C) (Oral)   Resp (!) 24   Ht 5\' 8"  (1.727 m)   Wt 87.7 kg   SpO2 100%   BMI 29.40 kg/m  CVP= 7 Co-ox= 77 On norepi @ 8 mcg/min, vasopressin 0.03 U/min K= 4.5 On PRVC 18/40%/540/5  Intake/Output Summary (Last 24 hours) at 04/06/2019 1709 Last data filed at 04/06/2019 1600 Gross per 24 hour  Intake 10323.14 ml  Output 14848 ml  Net -4524.86 ml   Continue current care  Jesse Murphy C. Roxan Hockey, MD [facility] [phone]

## 2019-04-06 NOTE — Progress Notes (Signed)
RN observed that the fluid was not flowing from the coude catheter. Upon further inspection, RN observed what appeared to be a clot in the line which was blocking flow. Manual irrigation was performed with sterile saline solution. Several clots were removed. The catheter began to flow, but slower than it previously flowed. On call urologist contacted to report issues with three way catheter. Urologist stated he would stop by to assess.

## 2019-04-06 NOTE — Progress Notes (Addendum)
f    Advanced Heart Failure Rounding Note  PCP-Cardiologist: Kate Sable, MD   Subjective:    Remains on impella 5.5 support, P-6, Flow 4.0. On vent through trach. Respiratory culture with pseudomonas. Treated w/ Tressie Ellis and later changed to vancomycin. WBC ct 17, down from 36. AF.   Started CVVHD 2/11. 4.9 L removed yesterday. Wt not charted today. CVP 8-9  SCr stable at 1.7. Unable to track UOP. He is requiring continuous bladder irrigation for hematuria/ clot retention. Systemic Heparin off. Continues w/ half strength heparin in impella purge. Urology following. Foley contents still blood tinged. Hgb up post transfusion 2/18, from 7.8>>9.5.   Remains on vasopressin 0.03 units + NE 4 mcg.  Co-ox 77%   Flo-trak: CO 6.4 CI 2.7     Objective:   Weight Range: 92 kg Body mass index is 30.84 kg/m.   Vital Signs:   Temp:  [97.9 F (36.6 C)-98.5 F (36.9 C)] 97.9 F (36.6 C) (02/19 0819) Pulse Rate:  [72-84] 79 (02/19 0900) Resp:  [21-39] 23 (02/19 0900) BP: (112)/(52) 112/52 (02/19 0825) SpO2:  [95 %-100 %] 99 % (02/19 0900) Arterial Line BP: (71-151)/(42-84) 108/49 (02/19 0900) FiO2 (%):  [40 %] 40 % (02/19 0825) Last BM Date: (P) 04/06/19  Weight change: Filed Weights   04/03/19 0500 04/04/19 0500 04/05/19 0450  Weight: 95.1 kg 91.4 kg 92 kg    Intake/Output:   Intake/Output Summary (Last 24 hours) at 04/06/2019 0952 Last data filed at 04/06/2019 0900 Gross per 24 hour  Intake 10869.48 ml  Output 14147 ml  Net -3277.52 ml      Physical Exam  CVP 8-9  General:  On vent through trach. No resp difficulty, diaphoretic  HEENT: + cor-trak  Neck: supple. JVP hard to assess.  Carotids 2+ bilat; no bruits. No lymphadenopathy or thryomegaly appreciated. Cor: PMI nondisplaced. Regular rate & rhythm. No rubs, gallops or murmurs. R axillary impella Lungs: on vent through trach, course BS bilaterally   Abdomen: soft, nontender, nondistended. No hepatosplenomegaly. No  bruits or masses. Good bowel sounds. Extremities: no cyanosis, clubbing, rash, trace- 1+ bilateral ankle edema Neuro: alert  Follows commands.  GU: CBI with pink tinged color no clots.    Telemetry  NSR 80s, 12 beat run of VT   Labs    CBC Recent Labs    04/05/19 0505 04/05/19 0540 04/06/19 0404 04/06/19 0424  WBC 17.3*  --  17.5*  --   HGB 8.3*   < > 9.0* 9.5*  HCT 25.2*   < > 28.0* 28.0*  MCV 92.3  --  91.2  --   PLT 80*  --  83*  --    < > = values in this interval not displayed.   Basic Metabolic Panel Recent Labs    04/05/19 0505 04/05/19 0540 04/05/19 1622 04/05/19 1622 04/06/19 0404 04/06/19 0424  NA 134*   < > 135   < > 133* 131*  K 4.3   < > 4.6   < > 4.7 4.5  CL 99   < > 101  --  98  --   CO2 24   < > 24  --  24  --   GLUCOSE 192*   < > 134*  --  167*  --   BUN 36*   < > 35*  --  33*  --   CREATININE 1.64*   < > 1.73*  --  1.77*  --   CALCIUM 7.7*   < >  7.7*  --  7.7*  --   MG 2.6*  --   --   --  2.4  --   PHOS 2.1*   < > 3.0  --  2.5  --    < > = values in this interval not displayed.   Liver Function Tests Recent Labs    04/04/19 0355 04/04/19 1457 04/05/19 1622 04/06/19 0404  AST 39  --   --   --   ALT 25  --   --   --   ALKPHOS 73  --   --   --   BILITOT 0.9  --   --   --   PROT 5.5*  --   --   --   ALBUMIN 2.2*   < > 2.3* 2.3*   < > = values in this interval not displayed.   No results for input(s): LIPASE, AMYLASE in the last 72 hours. Cardiac Enzymes No results for input(s): CKTOTAL, CKMB, CKMBINDEX, TROPONINI in the last 72 hours.  BNP: BNP (last 3 results) Recent Labs    03/22/19 0401 03/26/19 0626 03/27/19 1255  BNP 340.4* 687.5* 800.4*    ProBNP (last 3 results) No results for input(s): PROBNP in the last 8760 hours.   D-Dimer No results for input(s): DDIMER in the last 72 hours. Hemoglobin A1C No results for input(s): HGBA1C in the last 72 hours. Fasting Lipid Panel No results for input(s): CHOL, HDL, LDLCALC,  TRIG, CHOLHDL, LDLDIRECT in the last 72 hours. Thyroid Function Tests No results for input(s): TSH, T4TOTAL, T3FREE, THYROIDAB in the last 72 hours.  Invalid input(s): FREET3  Other results:   Imaging    DG Chest Port 1 View  Result Date: 04/06/2019 CLINICAL DATA:  Patient with tracheostomy and Impella device EXAM: PORTABLE CHEST 1 VIEW COMPARISON:  April 05, 2019 FINDINGS: The Impella device is stable. The right PICC line is stable. The left central line is in good position terminating in the SVC, unchanged. The tracheostomy tube over right lies the tracheal air column. The heart size is borderline to mildly enlarged. The hila and mediastinum are normal. Haziness in the left base is likely a layering effusion. Mild pulmonary venous congestion not excluded. IMPRESSION: 1. Support apparatus as above. 2. Mild increased interstitial markings stable in the interval likely represent pulmonary venous congestion. There is likely a small layering effusion on the left with underlying atelectasis. Electronically Signed   By: Dorise Bullion III M.D   On: 04/06/2019 07:47     Medications:     Scheduled Medications: . aspirin  81 mg Per Tube Daily  . atorvastatin  80 mg Per Tube q1800  . B-complex with vitamin C  1 tablet Oral Daily  . chlorhexidine gluconate (MEDLINE KIT)  15 mL Mouth Rinse BID  . Chlorhexidine Gluconate Cloth  6 each Topical Daily  . feeding supplement (PRO-STAT SUGAR FREE 64)  60 mL Per Tube BID  . Gerhardt's butt cream   Topical BID  . hydrocortisone   Rectal BID  . insulin aspart  0-24 Units Subcutaneous Q4H  . insulin aspart  5 Units Subcutaneous Q4H  . insulin glargine  26 Units Subcutaneous Daily  . mouth rinse  15 mL Mouth Rinse 10 times per day  . midodrine  10 mg Oral TID WC  . pantoprazole (PROTONIX) IV  40 mg Intravenous Q24H  . sodium chloride flush  10-40 mL Intracatheter Q12H  . tamsulosin  0.4 mg Oral Daily  Infusions: .  prismasol BGK 4/2.5 400  mL/hr at 04/06/19 0917  .  prismasol BGK 4/2.5 200 mL/hr at 04/06/19 0155  . sodium chloride    . sodium chloride Stopped (04/04/19 1736)  . sodium chloride 10 mL/hr at 03/29/19 2128  . albumin human 12.5 g (04/06/19 0900)  . amiodarone 60 mg/hr (04/06/19 0900)  . feeding supplement (VITAL 1.5 CAL) 1,000 mL (04/04/19 1755)  . impella catheter heparin 25 unit/mL in dextrose 5%    . heparin Stopped (04/06/19 0741)  . norepinephrine (LEVOPHED) Adult infusion 4 mcg/min (04/06/19 0901)  . prismasol BGK 4/2.5 1,500 mL/hr at 04/06/19 0155  . vancomycin Stopped (04/05/19 1746)  . vasopressin (PITRESSIN) infusion - *FOR SHOCK* 0.03 Units/min (04/06/19 0900)    PRN Medications: sodium chloride, oxyCODONE **AND** acetaminophen, dextrose, fentaNYL (SUBLIMAZE) injection, heparin, heparin, midazolam, ondansetron (ZOFRAN) IV, sodium chloride flush, sodium chloride flush    Assessment/Plan   1. Acute systolic HF -> Cardiogenic shock - post-op echo on 03/20/19 EF 25-30% (pre-op 30-35%. In 12/2017 EF normal) - Remains on Impella support P-6 Waveforms ok LDH  - CO-OX 77%. On VP 0.03  and NE 4 mcg.  - Likely vasodilated with CVVHD and Impella. - Continue CVVHD for volume removal. CVP 8-9 - He remains critically ill. Hemodynamically improved with full support with impella and dual pressors.   2. CAD s/p CABG this admit (LIMA->LAD, SVG->OM, SVG->RCA on 03/15/19) - No current s/s angina - Continue ASA/statin  3. Acute hypoxic respiratory failure with Pseudomonas PNA - s/p trach on 03/27/19 - WBC 36>26>17. Afebril  - initially treated w/ Tressie Ellis and changed to Vanc.  - CCM managing vent   4. PAF - s/p MAZE and LAA occlusion - In/out of AF. Remains in NSR  -continue IV amio - Off systemic heparin with hematuria   5. AKI  - due to shock/ATN -Creatinine peaked at 5.04 - Started [facility] 03/29/19 per Nephrology.  - SCr 1.77 today  6. Hematuria -LDH ok. Off heparin drip.  - Getting continuous  bladder irrigation.   - Urology following.   7. Melena - No melena overnight.   8. Anemia -Hematuria/melena -Hgb 7.6 -->10.2 ->8.9 >6.8 >8.3>7.8>9.0>9.5  - s/p transfusion x 1 unit 2/18     Length of Stay: [address] PA-C  04/06/2019, 9:52 AM  Advanced Heart Failure Team Pager [phone] (M-F; 7a - 4p)  Please contact [facility] for night-coverage after hours (4p -7a ) and weekends on [email or web address]  Agree with above  He remains critically ill with MSOF.  Remains on Impella 5.5 at P-6. CVVHD pulling 100/hr. VP 0.03 and NE down to 4. Off heparin except in Impella purge due to GI bleeding. Still with some bleeding in Foley. CVP 7-8. Remains in NSR on IV amio.   General:  Elderly. Weak appearing. No resp difficulty HEENT: normal Neck: supple. JVP 8 Carotids 2+ bilat; no bruits. No lymphadenopathy or thryomegaly appreciated. Cor: PMI nondisplaced. Regular rate & rhythm. R ax Impella LSC trialysis Lungs: clear Abdomen: soft, nontender, nondistended. No hepatosplenomegaly. No bruits or masses. Good bowel sounds. Extremities: no cyanosis, clubbing, rash, 2+ edema Neuro: alert & orientedx3, cranial nerves grossly intact. moves all 4 extremities w/o difficulty. Affect pleasant  He remains critically ill. I have discussed plan with Drs. Coladonato and [employer]. Plan will be to remove as much fluid today with CVVHD as possible. Then will wean Impella over the weekend with plan for extraction on Monday. Once Impella out then would  try to wean inotropes and hope for renal recovery or ability to tolerate iHD. If fails Impella or inotrorpe wean or unable to tolerate iHD would move to comfort care. D/w patient and son in detail.   CRITICAL CARE Performed by: Glori Bickers  Total critical care time: 35 minutes  Critical care time was exclusive of separately billable procedures and treating other patients.  Critical care was necessary to treat or prevent imminent or  life-threatening deterioration.  Critical care was time spent personally by me (independent of midlevel providers or residents) on the following activities: development of treatment plan with patient and/or surrogate as well as nursing, discussions with consultants, evaluation of patient's response to treatment, examination of patient, obtaining history from patient or surrogate, ordering and performing treatments and interventions, ordering and review of laboratory studies, ordering and review of radiographic studies, pulse oximetry and re-evaluation of patient's condition.   Glori Bickers, MD  2:53 PM

## 2019-04-06 NOTE — Progress Notes (Signed)
[facility] for Anticoagulation management/assistance  Indication: Impella 5.5  No Known Allergies  Patient Measurements: Height: 5\' 8"  (172.7 cm) Weight: 202 lb 13.2 oz (92 kg) IBW/kg (Calculated) : 68.4 Heparin Dosing Weight: 86kg  Vital Signs: Temp: 97.9 F (36.6 C) (02/19 0819) Temp Source: Oral (02/19 0819) BP: 112/52 (02/19 0825) Pulse Rate: 79 (02/19 1000)  Labs: Recent Labs    04/04/19 0355 04/04/19 0355 04/04/19 0418 04/04/19 1457 04/05/19 0505 04/05/19 0505 04/05/19 0540 04/05/19 0540 04/05/19 1622 04/05/19 2101 04/06/19 0404 04/06/19 0424  HGB 6.8*   < >   < > 8.6* 8.3*   < > 7.8*   < >  --   --  9.0* 9.5*  HCT 21.9*   < >   < > 26.7* 25.2*   < > 23.0*  --   --   --  28.0* 28.0*  PLT 104*  --    < > 80* 80*  --   --   --   --   --  83*  --   APTT 99*  --   --   --  45*  --   --   --   --   --  55*  --   HEPARINUNFRC 0.13*   < >  --   --  <0.10*  --   --   --   --  <0.10* <0.10*  --   CREATININE 1.77*   < >   < > 1.76* 1.64*  --   --   --  1.73*  --  1.77*  --    < > = values in this interval not displayed.    Estimated Creatinine Clearance: 31.7 mL/min (A) (by C-G formula based on SCr of 1.77 mg/dL (H)).  Assessment: 86 year old male s/p CABG 1/29 with MAZE and LAA. Patient taken to OR 2/9 for tracheostomy and impella 5.5 placement.   Purge (25 units/ml of heparin) is running at 14 ml/hr, which provides 350 units/hr of heparin, and Dr. Prescott Gum added 400 units/hr of systemic heparin 2/18.   Systemic heparin gtt was turned off this AM for recurrent hematuria and blood per rectum.  Hgb up after PRBCs yesterday.  Heparin continues in the purge solution (350 units/hr).  Heparin level remains undetectable.  Goal of Therapy:  Heparin level ~0.3 units/mL per Dr. VT Monitor platelets by anticoagulation protocol: Yes   Plan:   Continue 1/2 strength heparin purge per MD Continue to follow daily heparin levels to rule out  accumulation Monitor CBC, s/sx bleeding  Marguerite Olea, [facility] Clinical Pharmacist Phone [phone] 04/06/2019

## 2019-04-06 NOTE — Progress Notes (Signed)
Patient ID: Jesse Murphy, male   DOB: 01/02/1929, 86 y.o.   MRN: [id number] S: No events overnight and able to UF but remains on pressors O:BP (!) 112/52   Pulse 79   Temp 97.9 F (36.6 C) (Oral)   Resp (!) 29   Ht '5\' 8"'  (1.727 m)   Wt 92 kg   SpO2 92%   BMI 30.84 kg/m   Intake/Output Summary (Last 24 hours) at 04/06/2019 1129 Last data filed at 04/06/2019 1100 Gross per 24 hour  Intake 7857.21 ml  Output 14095 ml  Net -6237.79 ml   Intake/Output: I/O last 3 completed shifts: In: 11989.1 [I.V.:2098.1; Blood:350; QMGNO:0370.4; NG/GT:1250; IV Piggyback:591.6] Out: 88891 [Urine:9300; QXIHW:3888]  Intake/Output this shift:  Total I/O In: 347.4 [I.V.:152.3; Other:56; NG/GT:100; IV Piggyback:39.2] Out: 766 [Other:766] Weight change:  Gen: on vent via trach, comfortabl CVS: no rub Resp: occ rhonchi Abd: +BS, soft, NT Ext: 1-2+ pretibial edema  Recent Labs  Lab 03/31/19 0408 03/31/19 1510 04/01/19 0400 04/01/19 0455 04/01/19 1844 04/01/19 1844 04/02/19 0349 04/02/19 0450 04/02/19 1612 04/02/19 2331 04/03/19 0548 04/03/19 0548 04/03/19 1600 04/03/19 1600 04/04/19 0355 04/04/19 0355 04/04/19 0418 04/04/19 1457 04/05/19 0505 04/05/19 0540 04/05/19 1622 04/06/19 0404 04/06/19 0424  NA 133*  134*   < > 134*   < > 134*   < > 136   < > 134*   < > 133*   < > 133*   < > 133*   < > 133* 134* 134* 135 135 133* 131*  K 4.3  4.4   < > 4.5   < > 4.4   < > 4.3   < > 4.6   < > 4.2   < > 4.6   < > 4.3   < > 4.2 4.3 4.3 4.3 4.6 4.7 4.5  CL 99   < > 100   < > 102   < > 101   < > 99   < > 97*  --  100  --  98  --   --  102 99  --  101 98  --   CO2 23   < > 24   < > 22   < > 25   < > 23  --  25  --  22  --  22  --   --  22 24  --  24 24  --   GLUCOSE 182*   < > 187*   < > 200*   < > 188*   < > 205*   < > 253*  --  201*  --  196*  --   --  120* 192*  --  134* 167*  --   BUN 70*   < > 49*   < > 47*   < > 43*   < > 44*   < > 40*  --  43*  --  38*  --   --  40* 36*  --  35* 33*  --    CREATININE 2.32*   < > 1.89*   < > 1.79*   < > 1.83*   < > 1.75*   < > 1.78*  --  1.86*  --  1.77*  --   --  1.76* 1.64*  --  1.73* 1.77*  --   ALBUMIN 2.0*   < > 2.0*   < > 2.0*   < > 2.0*   < > 2.0*  --  1.9*  --  1.8*  --  2.2*  --   --  2.6* 2.4*  --  2.3* 2.3*  --   CALCIUM 7.8*   < > 7.9*   < > 7.6*   < > 7.9*   < > 7.5*  --  7.6*  --  7.6*  --  7.5*  --   --  7.6* 7.7*  --  7.7* 7.7*  --   PHOS  --    < >  --   --  2.3*  --   --   --  1.9*  --   --   --  2.2*  --   --   --   --  2.5 2.1*  --  3.0 2.5  --   AST 47*  --  35  --   --   --  35  --   --   --  33  --   --   --  39  --   --   --   --   --   --   --   --   ALT 24  --  20  --   --   --  20  --   --   --  24  --   --   --  25  --   --   --   --   --   --   --   --    < > = values in this interval not displayed.   Liver Function Tests: Recent Labs  Lab 04/02/19 0349 04/02/19 1612 04/03/19 0548 04/03/19 1600 04/04/19 0355 04/04/19 1457 04/05/19 0505 04/05/19 1622 04/06/19 0404  AST 35  --  33  --  39  --   --   --   --   ALT 20  --  24  --  25  --   --   --   --   ALKPHOS 74  --  76  --  73  --   --   --   --   BILITOT 0.9  --  0.9  --  0.9  --   --   --   --   PROT 5.5*  --  5.3*  --  5.5*  --   --   --   --   ALBUMIN 2.0*   < > 1.9*   < > 2.2*   < > 2.4* 2.3* 2.3*   < > = values in this interval not displayed.   No results for input(s): LIPASE, AMYLASE in the last 168 hours. No results for input(s): AMMONIA in the last 168 hours. CBC: Recent Labs  Lab 04/03/19 1600 04/03/19 1600 04/04/19 0355 04/04/19 0418 04/04/19 1457 04/04/19 1457 04/05/19 0505 04/05/19 0505 04/05/19 0540 04/06/19 0404 04/06/19 0424  WBC 36.1*   < > 25.8*   < > 17.8*  --  17.3*  --   --  17.5*  --   HGB 8.4*   < > 6.8*   < > 8.6*   < > 8.3*   < > 7.8* 9.0* 9.5*  HCT 26.9*   < > 21.9*   < > 26.7*   < > 25.2*   < > 23.0* 28.0* 28.0*  MCV 94.1  --  94.8  --  92.1  --  92.3  --   --  91.2  --   PLT 127*   < > 104*   < >  80*  --  80*  --    --  83*  --    < > = values in this interval not displayed.   Cardiac Enzymes: No results for input(s): CKTOTAL, CKMB, CKMBINDEX, TROPONINI in the last 168 hours. CBG: Recent Labs  Lab 04/05/19 1607 04/05/19 1950 04/06/19 0008 04/06/19 0419 04/06/19 0818  GLUCAP 138* 155* 139* 167* 119*    Iron Studies: No results for input(s): IRON, TIBC, TRANSFERRIN, FERRITIN in the last 72 hours. Studies/Results: DG Chest Port 1 View  Result Date: 04/06/2019 CLINICAL DATA:  Patient with tracheostomy and Impella device EXAM: PORTABLE CHEST 1 VIEW COMPARISON:  April 05, 2019 FINDINGS: The Impella device is stable. The right PICC line is stable. The left central line is in good position terminating in the SVC, unchanged. The tracheostomy tube over right lies the tracheal air column. The heart size is borderline to mildly enlarged. The hila and mediastinum are normal. Haziness in the left base is likely a layering effusion. Mild pulmonary venous congestion not excluded. IMPRESSION: 1. Support apparatus as above. 2. Mild increased interstitial markings stable in the interval likely represent pulmonary venous congestion. There is likely a small layering effusion on the left with underlying atelectasis. Electronically Signed   By: Dorise Bullion III M.D   On: 04/06/2019 07:47   DG Chest Port 1 View  Result Date: 04/05/2019 CLINICAL DATA:  History of CABG. EXAM: PORTABLE CHEST 1 VIEW COMPARISON:  Radiograph yesterday. FINDINGS: Tracheostomy tube tip at the thoracic inlet. 2 enteric tubes remain in place with tip below the diaphragm. Right upper extremity PICC and left subclavian central line remain in place. Right subclavian Impella unchanged in positioning. Post median sternotomy. Unchanged cardiomegaly post CABG. Left atrial clipping. Hazy bilateral lung opacities, left greater than right, likely related to pleural effusion and adjacent atelectasis. Stable pulmonary vasculature. No new airspace disease. No  pneumothorax. IMPRESSION: 1. Stable support apparatus. 2. Stable hazy bilateral lower lung zone opacities likely combination of pleural fluid and atelectasis, left greater than right. Electronically Signed   By: Keith Rake M.D.   On: 04/05/2019 05:22   . aspirin  81 mg Per Tube Daily  . atorvastatin  80 mg Per Tube q1800  . B-complex with vitamin C  1 tablet Oral Daily  . chlorhexidine gluconate (MEDLINE KIT)  15 mL Mouth Rinse BID  . Chlorhexidine Gluconate Cloth  6 each Topical Daily  . feeding supplement (PRO-STAT SUGAR FREE 64)  60 mL Per Tube BID  . Gerhardt's butt cream   Topical BID  . hydrocortisone   Rectal BID  . insulin aspart  0-24 Units Subcutaneous Q4H  . insulin aspart  5 Units Subcutaneous Q4H  . insulin glargine  26 Units Subcutaneous Daily  . mouth rinse  15 mL Mouth Rinse 10 times per day  . midodrine  10 mg Oral TID WC  . pantoprazole (PROTONIX) IV  40 mg Intravenous Q24H  . sodium chloride flush  10-40 mL Intracatheter Q12H  . tamsulosin  0.4 mg Oral Daily    BMET    Component Value Date/Time   NA 131 (L) 04/06/2019 0424   K 4.5 04/06/2019 0424   CL 98 04/06/2019 0404   CO2 24 04/06/2019 0404   GLUCOSE 167 (H) 04/06/2019 0404   BUN 33 (H) 04/06/2019 0404   CREATININE 1.77 (H) 04/06/2019 0404   CALCIUM 7.7 (L) 04/06/2019 0404   GFRNONAA 34 (L) 04/06/2019 0404   GFRAA 39 (L) 04/06/2019 0404  CBC    Component Value Date/Time   WBC 17.5 (H) 04/06/2019 0404   RBC 3.07 (L) 04/06/2019 0404   HGB 9.5 (L) 04/06/2019 0424   HCT 28.0 (L) 04/06/2019 0424   PLT 83 (L) 04/06/2019 0404   MCV 91.2 04/06/2019 0404   MCH 29.3 04/06/2019 0404   MCHC 32.1 04/06/2019 0404   RDW 17.9 (H) 04/06/2019 0404   LYMPHSABS 0.6 (L) 03/13/2019 1056   MONOABS 0.5 03/13/2019 1056   EOSABS 0.1 03/13/2019 1056   BASOSABS 0.0 03/13/2019 1056     Assessment/Plan:  1. AKI(Cr was 0.7 prior to surgery)- due to postoperative CHF/cardiogenic shock that was refractory to  diuretics and started on CRRT on 03/29/19 for volume removal. Tolerating CRRT with volume removal of 200 ml/hr. UF limited by low BP but is down 9 kg since starting CRRT. 1. Able to UF ~3 liters overnight but difficult to know for certain given bladder irrigations 2. Discussed case with Dr. Haroldine Laws and plan is to get CVP to 5-8 while not having to increase pressors in the hopes of weaning impella.  So will continue to UF as tolerated for now. 3. Would not offer more CVVHD by the end of this week if he does not show any significant improvement as he is not a longterm dialysis candidate and CVVHD was only initiated to improve his volume status. 4. Recommend palliative care consult to help set goals/limits of care. 2. Acute systolic CHF/cardiogenic shock- EF 25-30% currently on Impella. Continue with CVVHD and UF as tolerated. 3. CAD s/p CABG on 2/86/75 complicated by cardiogenic shock and ARF. 4. Acute hypoxic respiratory failure with pseudomonas pneumonia- s/p trach on 03/27/19. Cont with fortaz per PCCM 5. PAF- s/p MAZE and LAA occlusion- on amio per CHF team 6. Hematuria- bladder irrigation 7. ABLA- Has Melena. Transfuse as needed. Received 2 units PRBC's overnight with appropriate increase in Hgb from 7.6 to 10.2. Continue to follow H/H and transfuse prn.  8. Protein malnutrition- moderate to severe 9. Hypophosphatemia- dropping again and will replete and follow. 10. Vascular access- left  HD catheter placed 03/29/19 11. Disposition- poor overall prognosis. He is not a longterm dialysis candidate given his cardiogenic shock and trach. Recommend palliative care consult to help set goals/limits of care.  Donetta Potts, MD [employer] [phone]

## 2019-04-07 ENCOUNTER — Inpatient Hospital Stay (HOSPITAL_COMMUNITY): Payer: Medicare Other

## 2019-04-07 LAB — RENAL FUNCTION PANEL
Albumin: 2.5 g/dL — ABNORMAL LOW (ref 3.5–5.0)
Albumin: 2.6 g/dL — ABNORMAL LOW (ref 3.5–5.0)
Anion gap: 12 (ref 5–15)
Anion gap: 9 (ref 5–15)
BUN: 37 mg/dL — ABNORMAL HIGH (ref 8–23)
BUN: 36 mg/dL — ABNORMAL HIGH (ref 8–23)
CO2: 23 mmol/L (ref 22–32)
CO2: 25 mmol/L (ref 22–32)
Calcium: 7.4 mg/dL — ABNORMAL LOW (ref 8.9–10.3)
Calcium: 7.8 mg/dL — ABNORMAL LOW (ref 8.9–10.3)
Chloride: 100 mmol/L (ref 98–111)
Chloride: 101 mmol/L (ref 98–111)
Creatinine, Ser: 2.06 mg/dL — ABNORMAL HIGH (ref 0.61–1.24)
Creatinine, Ser: 1.85 mg/dL — ABNORMAL HIGH (ref 0.61–1.24)
GFR calc Af Amer: 32 mL/min — ABNORMAL LOW (ref >60)
GFR calc Af Amer: 37 mL/min — ABNORMAL LOW (ref >60)
GFR calc non Af Amer: 28 mL/min — ABNORMAL LOW (ref >60)
GFR calc non Af Amer: 32 mL/min — ABNORMAL LOW (ref >60)
Glucose, Bld: 154 mg/dL — ABNORMAL HIGH (ref 70–99)
Glucose, Bld: 132 mg/dL — ABNORMAL HIGH (ref 70–99)
Phosphorus: 2.4 mg/dL — ABNORMAL LOW (ref 2.5–4.6)
Phosphorus: 2.3 mg/dL — ABNORMAL LOW (ref 2.5–4.6)
Potassium: 4.9 mmol/L (ref 3.5–5.1)
Potassium: 4.7 mmol/L (ref 3.5–5.1)
Sodium: 135 mmol/L (ref 135–145)
Sodium: 135 mmol/L (ref 135–145)

## 2019-04-07 LAB — LACTATE DEHYDROGENASE: LDH: 267 U/L — ABNORMAL HIGH (ref 98–192)

## 2019-04-07 LAB — CBC
HCT: 22.5 % — ABNORMAL LOW (ref 39.0–52.0)
Hemoglobin: 7.1 g/dL — ABNORMAL LOW (ref 13.0–17.0)
MCH: 29 pg (ref 26.0–34.0)
MCHC: 31.6 g/dL (ref 30.0–36.0)
MCV: 91.8 fL (ref 80.0–100.0)
Platelets: 71 10*3/uL — ABNORMAL LOW (ref 150–400)
RBC: 2.45 MIL/uL — ABNORMAL LOW (ref 4.22–5.81)
RDW: 17.8 % — ABNORMAL HIGH (ref 11.5–15.5)
WBC: 12.3 10*3/uL — ABNORMAL HIGH (ref 4.0–10.5)
nRBC: 0 % (ref 0.0–0.2)

## 2019-04-07 LAB — POCT I-STAT 7, (LYTES, BLD GAS, ICA,H+H)
Acid-Base Excess: 3 mmol/L — ABNORMAL HIGH (ref 0.0–2.0)
Bicarbonate: 25.9 mmol/L (ref 20.0–28.0)
Calcium, Ion: 1.05 mmol/L — ABNORMAL LOW (ref 1.15–1.40)
HCT: 21 % — ABNORMAL LOW (ref 39.0–52.0)
Hemoglobin: 7.1 g/dL — ABNORMAL LOW (ref 13.0–17.0)
O2 Saturation: 98 %
Patient temperature: 100.5
Potassium: 4.5 mmol/L (ref 3.5–5.1)
Sodium: 134 mmol/L — ABNORMAL LOW (ref 135–145)
TCO2: 27 mmol/L (ref 22–32)
pCO2 arterial: 34.4 mmHg (ref 32.0–48.0)
pH, Arterial: 7.488 — ABNORMAL HIGH (ref 7.350–7.450)
pO2, Arterial: 101 mmHg (ref 83.0–108.0)

## 2019-04-07 LAB — PREPARE RBC (CROSSMATCH)

## 2019-04-07 LAB — GLUCOSE, CAPILLARY
Glucose-Capillary: 129 mg/dL — ABNORMAL HIGH (ref 70–99)
Glucose-Capillary: 138 mg/dL — ABNORMAL HIGH (ref 70–99)
Glucose-Capillary: 141 mg/dL — ABNORMAL HIGH (ref 70–99)
Glucose-Capillary: 152 mg/dL — ABNORMAL HIGH (ref 70–99)
Glucose-Capillary: 182 mg/dL — ABNORMAL HIGH (ref 70–99)
Glucose-Capillary: 186 mg/dL — ABNORMAL HIGH (ref 70–99)

## 2019-04-07 LAB — POCT ACTIVATED CLOTTING TIME: Activated Clotting Time: 0 seconds

## 2019-04-07 LAB — COOXEMETRY PANEL
Carboxyhemoglobin: 1.8 % — ABNORMAL HIGH (ref 0.5–1.5)
Methemoglobin: 1.1 % (ref 0.0–1.5)
O2 Saturation: 72.1 %
Total hemoglobin: 7.5 g/dL — ABNORMAL LOW (ref 12.0–16.0)

## 2019-04-07 LAB — MAGNESIUM: Magnesium: 2.3 mg/dL (ref 1.7–2.4)

## 2019-04-07 MED ORDER — SODIUM CHLORIDE 0.9 % IV SOLN
1.0000 g | Freq: Two times a day (BID) | INTRAVENOUS | Status: DC
  Filled 2019-04-07 (×2): qty 1

## 2019-04-07 MED ORDER — SODIUM CHLORIDE 0.9 % IV SOLN
1.0000 g | Freq: Three times a day (TID) | INTRAVENOUS | Status: DC
  Administered 2019-04-07 – 2019-04-10 (×9): 1 g via INTRAVENOUS
  Filled 2019-04-07 (×14): qty 1

## 2019-04-07 MED ORDER — CHLORHEXIDINE GLUCONATE 0.12 % MT SOLN
OROMUCOSAL | Status: AC
  Administered 2019-04-07: 15 mL via OROMUCOSAL
  Filled 2019-04-07: qty 15

## 2019-04-07 NOTE — Progress Notes (Signed)
Patient ID: Jesse Murphy, male   DOB: 03-03-1928, 86 y.o.   MRN: [id number]  11 Days Post-Op Subjective: Pt remains on CBI for hematuria of unclear etiology.   Objective: Vital signs in last 24 hours: Temp:  [97.7 F (36.5 C)-100.5 F (38.1 C)] 100.3 F (37.9 C) (02/20 0400) Pulse Rate:  [38-84] 79 (02/20 0700) Resp:  [21-33] 26 (02/20 0700) BP: (75-125)/(21-83) 89/55 (02/20 0700) SpO2:  [92 %-100 %] 100 % (02/20 0700) Arterial Line BP: (64-132)/(44-61) 111/47 (02/20 0700) FiO2 (%):  [40 %] 40 % (02/20 0406) Weight:  [87.7 kg] 87.7 kg (02/19 1400)  Intake/Output from previous day: 02/19 0701 - 02/20 0700 In: 27047.1 [I.V.:1331.8; NG/GT:1190; IV Piggyback:217.3] Out: 22482 [Urine:28925; Emesis/NG output:50] Intake/Output this shift: No intake/output data recorded.  Physical Exam:  General: Alert and oriented GU: On moderate CBI drip with light pink urine (according to nursing, they have been able to wean down CBI some overnight)  Lab Results: Recent Labs    04/06/19 1233 04/07/19 0405 04/07/19 0430  HGB 8.4* 7.1* 7.1*  HCT 26.0* 21.0* 22.5*   BMET Recent Labs    04/06/19 0404 04/06/19 0424 04/07/19 0405 04/07/19 0431  NA 133*   < > 134* 135  K 4.7   < > 4.5 4.9  CL 98  --   --  100  CO2 24  --   --  23  GLUCOSE 167*  --   --  154*  BUN 33*  --   --  37*  CREATININE 1.77*  --   --  2.06*  CALCIUM 7.7*  --   --  7.4*   < > = values in this interval not displayed.     Studies/Results:   Assessment/Plan: 1) Hematuria: Unclear etiology.  Off heparin drip for over 24 hrs.  Hgb dropping 8.4 -> 7.1.  Would transfuse as necessary.  Continue to wean CBI.  With CBI able to be weaned some overnight, this would suggest resolving hematuria and amount of hematuria would appear to be minimally clinically significant.  Considering overall medical situation, would not recommend further urologic intervention/evaluation at this time but rather supportive care and catheter  management of hematuria.   LOS: 24 days   Dutch Gray 04/07/2019, 7:07 AM

## 2019-04-07 NOTE — Progress Notes (Signed)
Called by RN that Aline was not reading and not pulling back. RT was unable to fix and had to be removed. Attempted by 2 RT to re-stick for Aline and was unable. RN aware and RN to notify MD.

## 2019-04-07 NOTE — Progress Notes (Signed)
11 Days Post-Op Procedure(s) (LRB): TRACHEOSTOMY placed using Shiley 8DCT Cuffed. (N/A) Placement Of Impella Left Ventricular Assist Device using ABIOMED Impella 5.5 with SmartAssist Device. (Right) Subjective: Intubated, sedated  Objective: Vital signs in last 24 hours: Temp:  [97.7 F (36.5 C)-100.5 F (38.1 C)] 99 F (37.2 C) (02/20 0930) Pulse Rate:  [38-82] 74 (02/20 0945) Cardiac Rhythm: Normal sinus rhythm;Atrial fibrillation (02/20 0800) Resp:  [17-30] 19 (02/20 0945) BP: (75-125)/(21-83) 89/55 (02/20 0700) SpO2:  [95 %-100 %] 100 % (02/20 0945) Arterial Line BP: (64-119)/(42-58) 104/42 (02/20 0945) FiO2 (%):  [40 %] 40 % (02/20 0805) Weight:  [87.7 kg] 87.7 kg (02/19 1400)  Hemodynamic parameters for last 24 hours: CVP:  [4 mmHg-19 mmHg] 19 mmHg  Intake/Output from previous day: 02/19 0701 - 02/20 0700 In: 27047.1 [I.V.:1331.8; NG/GT:1190; IV Piggyback:217.3] Out: 35009 [Urine:28925; Emesis/NG output:50] Intake/Output this shift: Total I/O In: 450.1 [I.V.:152.1; Other:28; NG/GT:270] Out: 1937 [FGHWE:9937; Other:397]  General appearance: intubated Heart: regular rate and rhythm Lungs: rhonchi bilaterally Abdomen: normal findings: soft, non-tender  Lab Results: Recent Labs    04/06/19 1233 04/06/19 1233 04/07/19 0405 04/07/19 0430  WBC 16.6*  --   --  12.3*  HGB 8.4*   < > 7.1* 7.1*  HCT 26.0*   < > 21.0* 22.5*  PLT 74*  --   --  71*   < > = values in this interval not displayed.   BMET:  Recent Labs    04/06/19 0404 04/06/19 0424 04/07/19 0405 04/07/19 0431  NA 133*   < > 134* 135  K 4.7   < > 4.5 4.9  CL 98  --   --  100  CO2 24  --   --  23  GLUCOSE 167*  --   --  154*  BUN 33*  --   --  37*  CREATININE 1.77*  --   --  2.06*  CALCIUM 7.7*  --   --  7.4*   < > = values in this interval not displayed.    PT/INR: No results for input(s): LABPROT, INR in the last 72 hours. ABG    Component Value Date/Time   PHART 7.488 (H) 04/07/2019 0405    HCO3 25.9 04/07/2019 0405   TCO2 27 04/07/2019 0405   ACIDBASEDEF 1.0 04/05/2019 0540   O2SAT 72.1 04/07/2019 0425   CBG (last 3)  Recent Labs    04/06/19 2344 04/07/19 0355 04/07/19 0832  GLUCAP 164* 141* 138*    Assessment/Plan: S/P Procedure(s) (LRB): TRACHEOSTOMY placed using Shiley 8DCT Cuffed. (N/A) Placement Of Impella Left Ventricular Assist Device using ABIOMED Impella 5.5 with SmartAssist Device. (Right) remains critically ill with multiple organ system failure  CV- hemodynamics stable- down to p5 on Impella with good index  Stable doses of levophed and vasopressin, amiodarone at 30 in SR  RESP- VDRF- vent per CCM  RENAL- on CVVHD, pulling about 30 ml/hr  Hematuria- on CBI  ENDO_ CBG well controlled  Receiving blood transfusion. Dark BM this AM but no fresh blood    LOS: 24 days    Melrose Nakayama 04/07/2019

## 2019-04-07 NOTE — Progress Notes (Signed)
Pharmacy Antibiotic Note  Jesse Murphy is a 86 y.o. male admitted on 03/13/2019 with sepsis.   Patient with temp of 100.5 overnight, wbc down slightly to 12.3. Recently completed treatment of pseudomonas with ceftazidime on 2/16, vancomycin stopped 2/19.   CXR still concerning for L base infiltrate, will start empiric meropenem and reculture. Patient continues on CRRT/UF.    Plan: Meropenem 1g IV q8 hours while on CRRT  Monitor renal fx, cx results, clinical pic, and duration of therapy  Height: 5\' 8"  (172.7 cm) Weight: (Unable due to bed error) IBW/kg (Calculated) : 68.4  Temp (24hrs), Avg:98.9 F (37.2 C), Min:97.7 F (36.5 C), Max:100.5 F (38.1 C)  Recent Labs  Lab 04/04/19 1457 04/05/19 0505 04/05/19 1622 04/06/19 0404 04/06/19 1233 04/07/19 0430 04/07/19 0431  WBC 17.8* 17.3*  --  17.5* 16.6* 12.3*  --   CREATININE 1.76* 1.64* 1.73* 1.77*  --   --  2.06*  VANCORANDOM 15  --   --   --   --   --   --     Estimated Creatinine Clearance: 26.7 mL/min (A) (by C-G formula based on SCr of 2.06 mg/dL (H)).    No Known Allergies  Antimicrobials this admission: CTX 2/1 >> 2/7 Cefepime 2/8 >> 2/10 Fortaz 2/10 >> 2/16 Vanc 2/16>>2/19 Meropenem 2/20>>   Microbiology results: 2/20 blood 2/20 respiratory cx 2/16 blood x 2: ngtd 2/16 TA: pseudomonas, few c.tropicalis 2/8 resp cx - Pseudomonas- R - cefepime (not included on report, S fortaz,cipro, gent, primaxin) 2/1 TA: normal flora 1/28 MRSA: neg 1/26 COVID/flu: neg  Thank you for allowing pharmacy to be a part of this patient's care.  Erin Hearing PharmD., BCPS Clinical Pharmacist 04/07/2019 10:20 AM  c

## 2019-04-07 NOTE — Progress Notes (Signed)
After bathing Pt, Staff attempted to obtain Pt daily weight and recieved 2 Technical ERROR messages to contact Company. Pt on CRRT and when next filter change will swap out and tag bed for repair.

## 2019-04-07 NOTE — Progress Notes (Signed)
Patient ID: Jesse Murphy, male   DOB: 08/17/1928, 86 y.o.   MRN: [id number] S: UF was interrupted last night due to hypotension and was kept even. O:BP (!) 89/55   Pulse 73   Temp 99.2 F (37.3 C) (Oral)   Resp 18   Ht '5\' 8"'  (1.727 m)   Wt 87.7 kg   SpO2 100%   BMI 29.40 kg/m   Intake/Output Summary (Last 24 hours) at 04/07/2019 1031 Last data filed at 04/07/2019 1000 Gross per 24 hour  Intake 27026.98 ml  Output 32970 ml  Net -5943.02 ml   Intake/Output: I/O last 3 completed shifts: In: 29718.1 [I.V.:1991.2; QQPYP:95093; NG/GT:1790; IV Piggyback:260.9] Out: 26712 [WPYKD:98338; Emesis/NG output:50; Other:5221]  Intake/Output this shift:  Total I/O In: 463.3 [I.V.:152.1; Other:41.2; NG/GT:270] Out: 1937 [SNKNL:9767; Other:397] Weight change:  Gen: on vent via trach, resting comfortably CVS: no rub Resp: cta Abd: +BS, soft, NT Ext: trace dependent edema  Recent Labs  Lab 04/01/19 0400 04/01/19 0455 04/02/19 0349 04/02/19 0450 04/02/19 1612 04/02/19 2331 04/03/19 0548 04/03/19 0548 04/03/19 1600 04/03/19 1600 04/04/19 0355 04/04/19 0418 04/04/19 1457 04/04/19 1457 04/05/19 0505 04/05/19 0540 04/05/19 1622 04/06/19 0404 04/06/19 0424 04/07/19 0405 04/07/19 0431  NA 134*   < > 136   < > 134*   < > 133*   < > 133*   < > 133*   < > 134*   < > 134* 135 135 133* 131* 134* 135  K 4.5   < > 4.3   < > 4.6   < > 4.2   < > 4.6   < > 4.3   < > 4.3   < > 4.3 4.3 4.6 4.7 4.5 4.5 4.9  CL 100   < > 101   < > 99   < > 97*   < > 100  --  98  --  102  --  99  --  101 98  --   --  100  CO2 24   < > 25   < > 23  --  25   < > 22  --  22  --  22  --  24  --  24 24  --   --  23  GLUCOSE 187*   < > 188*   < > 205*   < > 253*   < > 201*  --  196*  --  120*  --  192*  --  134* 167*  --   --  154*  BUN 49*   < > 43*   < > 44*   < > 40*   < > 43*  --  38*  --  40*  --  36*  --  35* 33*  --   --  37*  CREATININE 1.89*   < > 1.83*   < > 1.75*   < > 1.78*   < > 1.86*  --  1.77*  --  1.76*   --  1.64*  --  1.73* 1.77*  --   --  2.06*  ALBUMIN 2.0*   < > 2.0*   < > 2.0*  --  1.9*   < > 1.8*  --  2.2*  --  2.6*  --  2.4*  --  2.3* 2.3*  --   --  2.5*  CALCIUM 7.9*   < > 7.9*   < > 7.5*  --  7.6*   < > 7.6*  --  7.5*  --  7.6*  --  7.7*  --  7.7* 7.7*  --   --  7.4*  PHOS  --    < >  --   --  1.9*  --   --   --  2.2*  --   --   --  2.5  --  2.1*  --  3.0 2.5  --   --  2.4*  AST 35  --  35  --   --   --  33  --   --   --  39  --   --   --   --   --   --   --   --   --   --   ALT 20  --  20  --   --   --  24  --   --   --  25  --   --   --   --   --   --   --   --   --   --    < > = values in this interval not displayed.   Liver Function Tests: Recent Labs  Lab 04/02/19 0349 04/02/19 1612 04/03/19 0548 04/03/19 1600 04/04/19 0355 04/04/19 1457 04/05/19 1622 04/06/19 0404 04/07/19 0431  AST 35  --  33  --  39  --   --   --   --   ALT 20  --  24  --  25  --   --   --   --   ALKPHOS 74  --  76  --  73  --   --   --   --   BILITOT 0.9  --  0.9  --  0.9  --   --   --   --   PROT 5.5*  --  5.3*  --  5.5*  --   --   --   --   ALBUMIN 2.0*   < > 1.9*   < > 2.2*   < > 2.3* 2.3* 2.5*   < > = values in this interval not displayed.   No results for input(s): LIPASE, AMYLASE in the last 168 hours. No results for input(s): AMMONIA in the last 168 hours. CBC: Recent Labs  Lab 04/04/19 1457 04/04/19 1457 04/05/19 0505 04/05/19 0540 04/06/19 0404 04/06/19 0424 04/06/19 1233 04/07/19 0405 04/07/19 0430  WBC 17.8*   < > 17.3*   < > 17.5*  --  16.6*  --  12.3*  NEUTROABS  --   --   --   --   --   --  13.7*  --   --   HGB 8.6*   < > 8.3*   < > 9.0*   < > 8.4* 7.1* 7.1*  HCT 26.7*   < > 25.2*   < > 28.0*   < > 26.0* 21.0* 22.5*  MCV 92.1  --  92.3  --  91.2  --  91.5  --  91.8  PLT 80*   < > 80*   < > 83*  --  74*  --  71*   < > = values in this interval not displayed.   Cardiac Enzymes: No results for input(s): CKTOTAL, CKMB, CKMBINDEX, TROPONINI in the last 168  hours. CBG: Recent Labs  Lab 04/06/19 1636 04/06/19 2002 04/06/19 2344 04/07/19 0355 04/07/19 0832  GLUCAP 117* 229* 164* 141* 138*    Iron  Studies: No results for input(s): IRON, TIBC, TRANSFERRIN, FERRITIN in the last 72 hours. Studies/Results: DG Chest Port 1 View  Result Date: 04/07/2019 CLINICAL DATA:  Post CABG EXAM: PORTABLE CHEST 1 VIEW COMPARISON:  Chest radiograph from one day prior. FINDINGS: Tracheostomy tube tip overlies the tracheal air column at the level of the thoracic inlet. Enteric tube enters stomach with the tip not seen on this image. Right axillary approach Impella device is stable with tip overlying the left heart. Left subclavian central venous catheter terminates in the middle third of the SVC. Stable cardiomediastinal silhouette with top-normal heart size. No pneumothorax. Small bilateral pleural effusions are unchanged. No overt pulmonary edema. Left lung base atelectasis is unchanged. IMPRESSION: 1. No pneumothorax.  Support structures as detailed. 2. Stable small bilateral pleural effusions and left lung base atelectasis. Electronically Signed   By: Ilona Sorrel M.D.   On: 04/07/2019 06:31   DG Chest Port 1 View  Result Date: 04/06/2019 CLINICAL DATA:  Patient with tracheostomy and Impella device EXAM: PORTABLE CHEST 1 VIEW COMPARISON:  April 05, 2019 FINDINGS: The Impella device is stable. The right PICC line is stable. The left central line is in good position terminating in the SVC, unchanged. The tracheostomy tube over right lies the tracheal air column. The heart size is borderline to mildly enlarged. The hila and mediastinum are normal. Haziness in the left base is likely a layering effusion. Mild pulmonary venous congestion not excluded. IMPRESSION: 1. Support apparatus as above. 2. Mild increased interstitial markings stable in the interval likely represent pulmonary venous congestion. There is likely a small layering effusion on the left with underlying  atelectasis. Electronically Signed   By: Dorise Bullion III M.D   On: 04/06/2019 07:47   . aspirin  81 mg Per Tube Daily  . atorvastatin  80 mg Per Tube q1800  . B-complex with vitamin C  1 tablet Oral Daily  . chlorhexidine gluconate (MEDLINE KIT)  15 mL Mouth Rinse BID  . Chlorhexidine Gluconate Cloth  6 each Topical Daily  . feeding supplement (PRO-STAT SUGAR FREE 64)  60 mL Per Tube BID  . Gerhardt's butt cream   Topical BID  . hydrocortisone   Rectal BID  . insulin aspart  0-24 Units Subcutaneous Q4H  . insulin aspart  5 Units Subcutaneous Q4H  . insulin glargine  26 Units Subcutaneous Daily  . mouth rinse  15 mL Mouth Rinse 10 times per day  . midodrine  10 mg Oral TID WC  . pantoprazole (PROTONIX) IV  40 mg Intravenous Q24H  . sodium chloride flush  10-40 mL Intracatheter Q12H  . tamsulosin  0.4 mg Oral Daily    BMET    Component Value Date/Time   NA 135 04/07/2019 0431   K 4.9 04/07/2019 0431   CL 100 04/07/2019 0431   CO2 23 04/07/2019 0431   GLUCOSE 154 (H) 04/07/2019 0431   BUN 37 (H) 04/07/2019 0431   CREATININE 2.06 (H) 04/07/2019 0431   CALCIUM 7.4 (L) 04/07/2019 0431   GFRNONAA 28 (L) 04/07/2019 0431   GFRAA 32 (L) 04/07/2019 0431   CBC    Component Value Date/Time   WBC 12.3 (H) 04/07/2019 0430   RBC 2.45 (L) 04/07/2019 0430   HGB 7.1 (L) 04/07/2019 0430   HCT 22.5 (L) 04/07/2019 0430   PLT 71 (L) 04/07/2019 0430   MCV 91.8 04/07/2019 0430   MCH 29.0 04/07/2019 0430   MCHC 31.6 04/07/2019 0430   RDW 17.8 (  H) 04/07/2019 0430   LYMPHSABS 0.5 (L) 04/06/2019 1233   MONOABS 1.9 (H) 04/06/2019 1233   EOSABS 0.2 04/06/2019 1233   BASOSABS 0.0 04/06/2019 1233    Assessment/Plan:  1. AKI(Cr was 0.7 prior to surgery)- due to postoperative CHF/cardiogenic shock that was refractory to diuretics and started on CRRT on 03/29/19 for volume removal. Tolerating CRRT with volume removal of 200 ml/hr. UF limited by low BP but is down 9 kg since starting  CRRT. 1. I's/O's difficult to assess due to bladder irrigations and malfunction of bed scale.  UF held last night due to hypotension. 2. Discussed case with Dr. Haroldine Laws and plan is to get CVP to 5-8 while not having to increase pressors in the hopes of weaning impella.  So will continue to UF as tolerated for now. 3. Would not offer more CVVHD by the end of this week if he does not show any significant improvement as he is not a longterm dialysis candidate and CVVHD was only initiated to improve his volume status. 4. Recommend palliative care consult to help set goals/limits of care. 2. Acute systolic CHF/cardiogenic shock- EF 25-30% currently on Impella. Continue with CVVHD and UF as tolerated. 3. CAD s/p CABG on 7/67/20 complicated by cardiogenic shock and ARF. 4. Acute hypoxic respiratory failure with pseudomonas pneumonia- s/p trach on 03/27/19. Cont with fortaz per PCCM 5. PAF- s/p MAZE and LAA occlusion- on amio per CHF team 6. Hematuria- bladder irrigation 7. ABLA- Has Melena. Transfuse as needed. Received 2 units PRBC's overnight with appropriate increase in Hgb from 7.6 to 10.2. Continue to follow H/H and transfuse prn.  8. Protein malnutrition- moderate to severe 9. Hypophosphatemia-repleted yesterday but may need more.  Continue to follow. 10. Vascular access- left [place] HD catheter placed 03/29/19 and will need a new access soon if plans to continue with CVVHD beyond Monday. 11. Disposition- poor overall prognosis. He is not a longterm dialysis candidate given his cardiogenic shock and trach. Recommend palliative care consult to help set goals/limits of care.  Donetta Potts, MD [employer] [phone]

## 2019-04-07 NOTE — Progress Notes (Signed)
Tried re-dressing A-line for RN this morning.  RN stated that it was full of blood and needed re-dressing.  A-Line re-dressed, was able to draw back on line after dressing was applied, but blood started to once again fill the dressing.  Reinforced dressing.  Will continue to monitor patient.

## 2019-04-07 NOTE — Progress Notes (Signed)
Patient ID: Jesse Murphy, male   DOB: [birth year], 86 y.o.   MRN: [id number]    Advanced Heart Failure Rounding Note  PCP-Cardiologist: Kate Sable, MD   Subjective:    Remains on impella 5.5 support, P-6, Flow 4.1. Co-ox 72%.  On vent through trach. Respiratory culture with pseudomonas. Treated w/ Tressie Ellis and later changed to vancomycin, now off abx. WBC ct 13, has had low grade fever overnight up to 100.5. CXR with left base infiltrate.   Started CVVHD 2/11. UF limited by MAP, have been pulling net 20-30 cc/hr overnight.  Today, CVP 7-8.   Unable to track UOP. He is requiring continuous bladder irrigation for hematuria/ clot retention. Systemic Heparin off. Continues w/heparin in impella purge. Urology following, saw today. Foley contents still blood tinged. Hgb down to 7.1 today, getting 1 unit PRBCs.    Remains on vasopressin 0.03 units + NE 8 mcg.     Flo-trak: CI 2.9.   He remains on amiodarone 60.    Objective:   Weight Range: 87.7 kg Body mass index is 29.4 kg/m.   Vital Signs:   Temp:  [97.7 F (36.5 C)-100.5 F (38.1 C)] 99 F (37.2 C) (02/20 0930) Pulse Rate:  [38-82] 72 (02/20 0930) Resp:  [17-30] 17 (02/20 0930) BP: (75-125)/(21-83) 89/55 (02/20 0700) SpO2:  [92 %-100 %] 100 % (02/20 0930) Arterial Line BP: (64-119)/(42-58) 92/42 (02/20 0930) FiO2 (%):  [40 %] 40 % (02/20 0805) Weight:  [87.7 kg] 87.7 kg (02/19 1400) Last BM Date: 04/06/19  Weight change: Filed Weights   04/05/19 0450 04/06/19 1400  Weight: 92 kg 87.7 kg    Intake/Output:   Intake/Output Summary (Last 24 hours) at 04/07/2019 0945 Last data filed at 04/07/2019 0900 Gross per 24 hour  Intake 27067.49 ml  Output 32929 ml  Net -5861.51 ml      Physical Exam  CVP 7-8  General: Tracheostomy with vent Neck: No JVD, no thyromegaly or thyroid nodule.  Lungs: Decreased at bases.  CV: Nondisplaced PMI.  Impella sounds.  No peripheral edema.   Abdomen: Soft, nontender, no  hepatosplenomegaly, no distention.  Skin: Intact without lesions or rashes.  Neurologic: Sedated Extremities: No clubbing or cyanosis.  HEENT: Normal.   Telemetry   NSR in 80s (personally reviewed)  Labs    CBC Recent Labs    04/06/19 1233 04/06/19 1233 04/07/19 0405 04/07/19 0430  WBC 16.6*  --   --  12.3*  NEUTROABS 13.7*  --   --   --   HGB 8.4*   < > 7.1* 7.1*  HCT 26.0*   < > 21.0* 22.5*  MCV 91.5  --   --  91.8  PLT 74*  --   --  71*   < > = values in this interval not displayed.   Basic Metabolic Panel Recent Labs    04/06/19 0404 04/06/19 0424 04/07/19 0405 04/07/19 0430 04/07/19 0431  NA 133*   < > 134*  --  135  K 4.7   < > 4.5  --  4.9  CL 98  --   --   --  100  CO2 24  --   --   --  23  GLUCOSE 167*  --   --   --  154*  BUN 33*  --   --   --  37*  CREATININE 1.77*  --   --   --  2.06*  CALCIUM 7.7*  --   --   --  7.4*  MG 2.4  --   --  2.3  --   PHOS 2.5  --   --   --  2.4*   < > = values in this interval not displayed.   Liver Function Tests Recent Labs    04/06/19 0404 04/07/19 0431  ALBUMIN 2.3* 2.5*   No results for input(s): LIPASE, AMYLASE in the last 72 hours. Cardiac Enzymes No results for input(s): CKTOTAL, CKMB, CKMBINDEX, TROPONINI in the last 72 hours.  BNP: BNP (last 3 results) Recent Labs    03/22/19 0401 03/26/19 0626 03/27/19 1255  BNP 340.4* 687.5* 800.4*    ProBNP (last 3 results) No results for input(s): PROBNP in the last 8760 hours.   D-Dimer No results for input(s): DDIMER in the last 72 hours. Hemoglobin A1C No results for input(s): HGBA1C in the last 72 hours. Fasting Lipid Panel No results for input(s): CHOL, HDL, LDLCALC, TRIG, CHOLHDL, LDLDIRECT in the last 72 hours. Thyroid Function Tests No results for input(s): TSH, T4TOTAL, T3FREE, THYROIDAB in the last 72 hours.  Invalid input(s): FREET3  Other results:   Imaging    DG Chest Port 1 View  Result Date: 04/07/2019 CLINICAL DATA:  Post  CABG EXAM: PORTABLE CHEST 1 VIEW COMPARISON:  Chest radiograph from one day prior. FINDINGS: Tracheostomy tube tip overlies the tracheal air column at the level of the thoracic inlet. Enteric tube enters stomach with the tip not seen on this image. Right axillary approach Impella device is stable with tip overlying the left heart. Left subclavian central venous catheter terminates in the middle third of the SVC. Stable cardiomediastinal silhouette with top-normal heart size. No pneumothorax. Small bilateral pleural effusions are unchanged. No overt pulmonary edema. Left lung base atelectasis is unchanged. IMPRESSION: 1. No pneumothorax.  Support structures as detailed. 2. Stable small bilateral pleural effusions and left lung base atelectasis. Electronically Signed   By: Ilona Sorrel M.D.   On: 04/07/2019 06:31     Medications:     Scheduled Medications: . aspirin  81 mg Per Tube Daily  . atorvastatin  80 mg Per Tube q1800  . B-complex with vitamin C  1 tablet Oral Daily  . chlorhexidine gluconate (MEDLINE KIT)  15 mL Mouth Rinse BID  . Chlorhexidine Gluconate Cloth  6 each Topical Daily  . feeding supplement (PRO-STAT SUGAR FREE 64)  60 mL Per Tube BID  . Gerhardt's butt cream   Topical BID  . hydrocortisone   Rectal BID  . insulin aspart  0-24 Units Subcutaneous Q4H  . insulin aspart  5 Units Subcutaneous Q4H  . insulin glargine  26 Units Subcutaneous Daily  . mouth rinse  15 mL Mouth Rinse 10 times per day  . midodrine  10 mg Oral TID WC  . pantoprazole (PROTONIX) IV  40 mg Intravenous Q24H  . sodium chloride flush  10-40 mL Intracatheter Q12H  . tamsulosin  0.4 mg Oral Daily    Infusions: .  prismasol BGK 4/2.5 400 mL (04/06/19 2324)  .  prismasol BGK 4/2.5 200 mL/hr at 04/06/19 0155  . sodium chloride    . sodium chloride Stopped (04/04/19 1736)  . sodium chloride 10 mL/hr at 03/29/19 2128  . amiodarone 60 mg/hr (04/07/19 0900)  . feeding supplement (VITAL 1.5 CAL) 50 mL/hr at  04/07/19 0200  . impella catheter heparin 25 unit/mL in dextrose 5%    . heparin Stopped (04/06/19 0741)  . norepinephrine (LEVOPHED) Adult infusion 8 mcg/min (04/07/19 0900)  .  prismasol BGK 4/2.5 1,500 mL/hr at 04/07/19 0943  . vasopressin (PITRESSIN) infusion - *FOR SHOCK* 0.03 Units/min (04/07/19 0900)    PRN Medications: sodium chloride, oxyCODONE **AND** acetaminophen, dextrose, fentaNYL (SUBLIMAZE) injection, heparin, heparin, midazolam, ondansetron (ZOFRAN) IV, sodium chloride flush, sodium chloride flush    Assessment/Plan   1. Acute systolic HF -> Cardiogenic shock - post-op echo on 03/20/19 EF 25-30% (pre-op 30-35%. In 12/2017 EF normal) - Remains on Impella support P-6 Waveforms ok LDH stable 267.  Co-ox 72% today with good CO on Flotrak.  I will decrease to P-5 today, try to slowly wean.  - Continue VP 0.03  and NE 8 mcg, wean as MAP tolerates. Likely vasodilated with CVVHD and Impella. - Continue CVVHD for volume removal. CVP now down to 7-8, think we are at goal. Continue gentle UF to keep him net negative.  - Plan will be wean Impella over the weekend with hopeful extraction on Monday. Once Impella out then would try to wean inotropes and hope for renal recovery or ability to tolerate iHD (suspect this would be difficult). If fails Impella or inotrope wean or unable to tolerate iHD would move to comfort care.   2. CAD s/p CABG this admit (LIMA->LAD, SVG->OM, SVG->RCA on 03/15/19) - No current s/s angina - Continue ASA/ticagrelor/statin  3. Acute hypoxic respiratory failure with Pseudomonas PNA - s/p trach on 03/27/19 - WBC 36>26>17>13 but low grade fevers overnight to 100.5. CXR with left base infiltrate today.  - initially treated w/ Tressie Ellis and changed to Vanc, now off antibiotics.  Clinically very tenuous, with recurrent fever will culture blood and trach aspirate and start cefepime empirically.   - CCM managing vent   4. PAF - s/p MAZE and LAA occlusion - In/out of  AF. Remains in NSR  -continue IV amio, decrease to 30 mg/hr.  - Off systemic heparin with hematuria   5. AKI  - due to shock/ATN - Creatinine peaked at 5.04 - Started [facility] 03/29/19 per Nephrology.  - CVP down to 7-8, will keep gentle UF for now, think volume is close to goal.   6. Hematuria - LDH ok. Off heparin drip.  - Getting continuous bladder irrigation.   - Urology following.   7. Melena - No melena overnight.   8. Anemia -Hematuria/melena -Hgb 7.6 -->10.2 ->8.9 >6.8 >8.3>7.8>9.0>9.5 > 7.1 - s/p transfusion x 1 unit 2/18, to get 1 unit this morning.   CRITICAL CARE Performed by: Loralie Champagne  Total critical care time: 35 minutes  Critical care time was exclusive of separately billable procedures and treating other patients.  Critical care was necessary to treat or prevent imminent or life-threatening deterioration.  Critical care was time spent personally by me (independent of midlevel providers or residents) on the following activities: development of treatment plan with patient and/or surrogate as well as nursing, discussions with consultants, evaluation of patient's response to treatment, examination of patient, obtaining history from patient or surrogate, ordering and performing treatments and interventions, ordering and review of laboratory studies, ordering and review of radiographic studies, pulse oximetry and re-evaluation of patient's condition.   Loralie Champagne, MD  9:45 AM

## 2019-04-07 NOTE — Progress Notes (Signed)
NAME:  Jesse Murphy, MRN:  [id number], DOB:  [birth year], LOS: 24 ADMISSION DATE:  03/13/2019, CONSULTATION DATE:  2/2 REFERRING MD:  Nils Pyle, CHIEF COMPLAINT:  Dyspnea   Brief History   86 y/o male admitted 1/26, underwent CABG and L atrial appendage clipping on 1/29, moved to ICU on 2/1 for intubation.  Past Medical History  A fib, CAD, Prostate cancer, HTN  Significant Hospital Events   1/29 CABG 2/01 to ICU 2/05 Extubated 2/08 Reintubated 2/09 tracheostomy 2/09 Impella, PA catheter placed 2/11 CRRT initiated 2/16 leukocytosis, add vancomycin 2/17 start TC trials 2/19 urology placed 3 way foley, bleeding at rectal tube site  Consults:  Nephrology CHF team Urology  Procedures:  Rt PICC 2/02 >> Trach 2/09 >> PA catheter 2/09 >> Lt [place] HD cath 2/11 >> L Rad Aline 2/3 >>  Impella 2/10 >>  Significant Diagnostic Tests:  Echo 1/26 >> EF 30 to 35%  Micro Data:  SARS CoV2 PCR 1/26 >> negative Influenza PCR 1/26 >> negative Sputum 2/01 >> oral flora Sputum 2/08 >> Pseudomonas >> pan sensitive  Blood 2/16 >> Sputum 2/16 >> Pseudomonas sens to cipro, gent, imepenem  Antimicrobials:  Rocephin 2/01 >> 2/07 Cefepime 2/08 >> 2/10 Fortaz 2/10 >> 2/16 Vancomycin 2/16 >>  Meropenem 2/20..  Interim history/subjective:  C/o discomfort at 3 way catheter site.  Feels more tired this morning after events of last night.  Rectal tube removed this AM.  Objective   Blood pressure (!) 98/58, pulse 72, temperature 99.2 F (37.3 C), temperature source Oral, resp. rate (!) 21, height 5\' 8"  (1.727 m), weight 87.7 kg, SpO2 100 %. CVP:  [2 mmHg-19 mmHg] 7 mmHg  Vent Mode: CPAP;PSV FiO2 (%):  [40 %] 40 % Set Rate:  [18 bmp] 18 bmp Vt Set:  [540 mL] 540 mL PEEP:  [5 cmH20] 5 cmH20 Pressure Support:  [8 cmH20] 8 cmH20 Plateau Pressure:  [17 cmH20] 17 cmH20   Intake/Output Summary (Last 24 hours) at 04/07/2019 1325 Last data filed at 04/07/2019 1300 Gross per 24 hour  Intake  27536.77 ml  Output 32806 ml  Net -5269.23 ml   Filed Weights   04/05/19 0450 04/06/19 1400  Weight: 92 kg 87.7 kg    Examination:  General - alert, answers y/n,  Eyes - PERRLA, EOMI ENT - trach site clean Cardiac - regular rate/rhythm, no murmur, impella sounds Chest - scattered rhonchi Abdomen - soft, non tender, + bowel sounds Extremities - 1+ edema, moves all on request Skin - no rashes Neuro - RASS 0   Resolved Hospital Problem list     Assessment & Plan:   Acute Hypoxemic Respiratory Failure due to Acute pulmonary edema complicated by Pseudomonas HCAP. - wean to TC as able with rest on PRVC as needed - prn BDs - f/u CXR intermittently  Cardiogenic Shock with acute systolic heart failure. New onset atrial fibrillation >> back in sinus rhythm. - impella, pressors per TCTS and CHF team - continue ASA, lipitor, midodrine, amiodarone - heparin gtt on hold in setting of hematuria and rectal bleeding  AKI secondary to Cardiogenic Shock. - CRRT per renal  Anemia of critical illness. - f/u CBC - transfuse for Hb < 7 or significant bleeding  Rectal bleeding. - noted 2/19 - likely from rectal tube irritation - rectal tube removed - monitor for further signs of bleeding  Hematuria with retained clots. - 3 way catheter with flushing by urology  DM2 with hyperglycemia, poorly controlled -  SSI with lantus and tube feed coverge  Acute metabolic encephalopathy due to shock, renal failure, hypoxemia - RASS goal 0  Pressure wounds / Reported as Skin Tear 2/15 -WOC following   Leukocytosis  - vancomycin per primary team   Best practice:  Diet: TF DVT prophylaxis: heparin infusion GI prophylaxis: PPI Mobility: bed rest Code Status: full Disposition: ICU  Labs    CMP Latest Ref Rng & Units 04/07/2019 04/07/2019 04/06/2019  Glucose 70 - 99 mg/dL 154(H) - -  BUN 8 - 23 mg/dL 37(H) - -  Creatinine 0.61 - 1.24 mg/dL 2.06(H) - -  Sodium 135 - 145 mmol/L 135  134(L) 131(L)  Potassium 3.5 - 5.1 mmol/L 4.9 4.5 4.5  Chloride 98 - 111 mmol/L 100 - -  CO2 22 - 32 mmol/L 23 - -  Calcium 8.9 - 10.3 mg/dL 7.4(L) - -  Total Protein 6.5 - 8.1 g/dL - - -  Total Bilirubin 0.3 - 1.2 mg/dL - - -  Alkaline Phos 38 - 126 U/L - - -  AST 15 - 41 U/L - - -  ALT 0 - 44 U/L - - -    CBC Latest Ref Rng & Units 04/07/2019 04/07/2019 04/06/2019  WBC 4.0 - 10.5 K/uL 12.3(H) - 16.6(H)  Hemoglobin 13.0 - 17.0 g/dL 7.1(L) 7.1(L) 8.4(L)  Hematocrit 39.0 - 52.0 % 22.5(L) 21.0(L) 26.0(L)  Platelets 150 - 400 K/uL 71(L) - 74(L)    ABG    Component Value Date/Time   PHART 7.488 (H) 04/07/2019 0405   PCO2ART 34.4 04/07/2019 0405   PO2ART 101.0 04/07/2019 0405   HCO3 25.9 04/07/2019 0405   TCO2 27 04/07/2019 0405   ACIDBASEDEF 1.0 04/05/2019 0540   O2SAT 72.1 04/07/2019 0425    CBG (last 3)  Recent Labs    04/07/19 0355 04/07/19 0832 04/07/19 1216  GLUCAP 141* 138* 186*     I have independently seen and examined the patient, reviewed data, and developed an assessment and plan. A total of 34 minutes were spent in critical care assessment and medical decision making. This critical care time does not reflect procedure time, or teaching time or supervisory time of PA/NP/Med student/Med Resident, etc but could involve care discussion time.  Bonna Gains, MD PhD 04/07/19 1:32 PM

## 2019-04-07 NOTE — Progress Notes (Signed)
Attempted left radial, left brachial a-line without success. U/S and doppler targets, but cannulation did not result in reliable flow. Requested to avoid RUE, given predominance of central line placements on that side already. Started to prepare for femoral placement and the patient asked [place] to stop. Asked if he wanted to stop a-line, he responded "everything". Son was off getting something to eat, so we waited until he returned.  Father and son then had a conversation that appeared to end with the son indicating that if we could alleviate some of the patient's pain--particularly pain related to his urethral catheter--he would want to continue with full support. Asked to defer a-line until morning.   While there is some resolution tonight, it does seem that Mr. Jesse Murphy is more ready to stop more of his aggressive treatment. We will ask palliative care to facilitate some of those conversations, achieve some family closure for goals of care and/or comfort.   In the meantime we will provide maximum supportive care. Dr. Aundra Dubin made aware by nursing staff.   Bonna Gains MD, PhD 6:49 PM

## 2019-04-07 NOTE — Progress Notes (Signed)
      [address]       [place] [address]             [phone]      A line was lost this afternoon, CCM attempting to place new one  BP (!) 114/44 (BP Location: Right Leg)   Pulse 81   Temp 99.2 F (37.3 C) (Oral)   Resp (!) 34   Ht 5\' 8"  (1.727 m)   Wt 87.7 kg   SpO2 98%   BMI 29.40 kg/m    Intake/Output Summary (Last 24 hours) at 04/07/2019 1701 Last data filed at 04/07/2019 1600 Gross per 24 hour  Intake 21319.72 ml  Output 24767 ml  Net -3447.28 ml   K= 4.7  Continue current care  Corry Storie C. Roxan Hockey, MD [facility] [phone]

## 2019-04-08 ENCOUNTER — Inpatient Hospital Stay (HOSPITAL_COMMUNITY): Payer: Medicare Other

## 2019-04-08 DIAGNOSIS — Z8572 Personal history of non-Hodgkin lymphomas: Secondary | ICD-10-CM

## 2019-04-08 LAB — CULTURE, BLOOD (ROUTINE X 2)
Culture: NO GROWTH
Culture: NO GROWTH

## 2019-04-08 LAB — CBC
HCT: 24.8 % — ABNORMAL LOW (ref 39.0–52.0)
Hemoglobin: 7.8 g/dL — ABNORMAL LOW (ref 13.0–17.0)
MCH: 29.2 pg (ref 26.0–34.0)
MCHC: 31.5 g/dL (ref 30.0–36.0)
MCV: 92.9 fL (ref 80.0–100.0)
Platelets: 84 10*3/uL — ABNORMAL LOW (ref 150–400)
RBC: 2.67 MIL/uL — ABNORMAL LOW (ref 4.22–5.81)
RDW: 17.4 % — ABNORMAL HIGH (ref 11.5–15.5)
WBC: 11.5 10*3/uL — ABNORMAL HIGH (ref 4.0–10.5)
nRBC: 0 % (ref 0.0–0.2)

## 2019-04-08 LAB — RENAL FUNCTION PANEL
Albumin: 2.3 g/dL — ABNORMAL LOW (ref 3.5–5.0)
Albumin: 2.3 g/dL — ABNORMAL LOW (ref 3.5–5.0)
Anion gap: 7 (ref 5–15)
Anion gap: 9 (ref 5–15)
BUN: 36 mg/dL — ABNORMAL HIGH (ref 8–23)
BUN: 35 mg/dL — ABNORMAL HIGH (ref 8–23)
CO2: 24 mmol/L (ref 22–32)
CO2: 25 mmol/L (ref 22–32)
Calcium: 7.2 mg/dL — ABNORMAL LOW (ref 8.9–10.3)
Calcium: 7.6 mg/dL — ABNORMAL LOW (ref 8.9–10.3)
Chloride: 101 mmol/L (ref 98–111)
Chloride: 102 mmol/L (ref 98–111)
Creatinine, Ser: 1.79 mg/dL — ABNORMAL HIGH (ref 0.61–1.24)
Creatinine, Ser: 1.75 mg/dL — ABNORMAL HIGH (ref 0.61–1.24)
GFR calc Af Amer: 38 mL/min — ABNORMAL LOW (ref >60)
GFR calc Af Amer: 39 mL/min — ABNORMAL LOW (ref >60)
GFR calc non Af Amer: 33 mL/min — ABNORMAL LOW (ref >60)
GFR calc non Af Amer: 34 mL/min — ABNORMAL LOW (ref >60)
Glucose, Bld: 198 mg/dL — ABNORMAL HIGH (ref 70–99)
Glucose, Bld: 170 mg/dL — ABNORMAL HIGH (ref 70–99)
Phosphorus: 2.5 mg/dL (ref 2.5–4.6)
Phosphorus: 2.6 mg/dL (ref 2.5–4.6)
Potassium: 4.4 mmol/L (ref 3.5–5.1)
Potassium: 4.8 mmol/L (ref 3.5–5.1)
Sodium: 132 mmol/L — ABNORMAL LOW (ref 135–145)
Sodium: 136 mmol/L (ref 135–145)

## 2019-04-08 LAB — TYPE AND SCREEN
ABO/RH(D): B POS
Antibody Screen: NEGATIVE
Unit division: 0
Unit division: 0
Unit division: 0
Unit division: 0

## 2019-04-08 LAB — GLUCOSE, CAPILLARY
Glucose-Capillary: 156 mg/dL — ABNORMAL HIGH (ref 70–99)
Glucose-Capillary: 158 mg/dL — ABNORMAL HIGH (ref 70–99)
Glucose-Capillary: 164 mg/dL — ABNORMAL HIGH (ref 70–99)
Glucose-Capillary: 169 mg/dL — ABNORMAL HIGH (ref 70–99)
Glucose-Capillary: 177 mg/dL — ABNORMAL HIGH (ref 70–99)
Glucose-Capillary: 192 mg/dL — ABNORMAL HIGH (ref 70–99)

## 2019-04-08 LAB — BPAM RBC
Blood Product Expiration Date: 202103182359
Blood Product Expiration Date: 202103192359
Blood Product Expiration Date: 202103212359
Blood Product Expiration Date: 202103232359
ISSUE DATE / TIME: 202102170642
ISSUE DATE / TIME: 202102171036
ISSUE DATE / TIME: 202102181026
ISSUE DATE / TIME: 202102200930
Unit Type and Rh: 7300
Unit Type and Rh: 7300
Unit Type and Rh: 7300
Unit Type and Rh: 7300

## 2019-04-08 LAB — COOXEMETRY PANEL
Carboxyhemoglobin: 1.4 % (ref 0.5–1.5)
Methemoglobin: 0.7 % (ref 0.0–1.5)
O2 Saturation: 71.2 %
Total hemoglobin: 7.6 g/dL — ABNORMAL LOW (ref 12.0–16.0)

## 2019-04-08 LAB — MAGNESIUM: Magnesium: 2.3 mg/dL (ref 1.7–2.4)

## 2019-04-08 LAB — LACTATE DEHYDROGENASE: LDH: 245 U/L — ABNORMAL HIGH (ref 98–192)

## 2019-04-08 MED ORDER — ALBUMIN HUMAN 5 % IV SOLN
12.5000 g | Freq: Once | INTRAVENOUS | Status: AC
  Administered 2019-04-08: 12.5 g via INTRAVENOUS

## 2019-04-08 NOTE — Progress Notes (Signed)
RT informed by RN that Pt refused ABG for this morning, does not want to be stuck anymore. RT will continue to monitor.

## 2019-04-08 NOTE — Progress Notes (Signed)
      [address]       [place] [address]             [phone]      Alert, son at bedside  BP (!) 101/50   Pulse 80   Temp 98.1 F (36.7 C) (Oral)   Resp (!) 24   Ht 5\' 8"  (1.727 m)   Wt 87.7 kg   SpO2 100%   BMI 29.40 kg/m   Intake/Output Summary (Last 24 hours) at 04/08/2019 1850 Last data filed at 04/08/2019 1800 Gross per 24 hour  Intake 9241.9 ml  Output 6788 ml  Net 2453.9 ml  Patient is comfortable with level care Plan is to attempt to remove Impella tomorrow  Remo Lipps C. Roxan Hockey, MD [facility] [phone]

## 2019-04-08 NOTE — Progress Notes (Signed)
Patient ID: Jesse Murphy, male   DOB: 1928/02/25, 86 y.o.   MRN: [id number] S: Admits to being tired.  Discussed stopping aggressive measures with Cardiology staff and son who wants to continue until Monday O:BP (!) 117/55   Pulse 77   Temp 98.1 F (36.7 C) (Oral)   Resp (!) 21   Ht '5\' 8"'  (1.727 m)   Wt 87.7 kg   SpO2 100%   BMI 29.40 kg/m   Intake/Output Summary (Last 24 hours) at 04/08/2019 1237 Last data filed at 04/08/2019 1200 Gross per 24 hour  Intake 12262.6 ml  Output 8542 ml  Net 3720.6 ml   Intake/Output: I/O last 3 completed shifts: In: 28888.8 [I.V.:1682.2; Blood:315; XTAVW:97948.0; NG/GT:1890; IV Piggyback:421.7] Out: 29213 [Urine:25385; Emesis/NG output:150; XKPVV:7482]  Intake/Output this shift:  Total I/O In: 4023.3 [I.V.:203.9; LMBEM:7544.1; NG/GT:460; IV Piggyback:292.3] Out: 9201 [Urine:2000; Other:375] Weight change:  Gen: on vent via trach, chronically ill-appearing CVS: no rub Resp: cta Abd: +BS, soft Ext: trace edema  Recent Labs  Lab 04/02/19 0349 04/02/19 0450 04/03/19 0548 04/03/19 1600 04/04/19 0355 04/04/19 0418 04/04/19 1457 04/04/19 1457 04/05/19 0505 04/05/19 0540 04/05/19 1622 04/06/19 0404 04/06/19 0424 04/07/19 0405 04/07/19 0431 04/07/19 1407 04/08/19 0323  NA 136   < > 133*   < > 133*   < > 134*   < > 134*   < > 135 133* 131* 134* 135 135 132*  K 4.3   < > 4.2   < > 4.3   < > 4.3   < > 4.3   < > 4.6 4.7 4.5 4.5 4.9 4.7 4.4  CL 101   < > 97*   < > 98   < > 102  --  99  --  101 98  --   --  100 101 101  CO2 25   < > 25   < > 22   < > 22  --  24  --  24 24  --   --  '23 25 24  ' GLUCOSE 188*   < > 253*   < > 196*   < > 120*  --  192*  --  134* 167*  --   --  154* 132* 198*  BUN 43*   < > 40*   < > 38*   < > 40*  --  36*  --  35* 33*  --   --  37* 36* 36*  CREATININE 1.83*   < > 1.78*   < > 1.77*   < > 1.76*  --  1.64*  --  1.73* 1.77*  --   --  2.06* 1.85* 1.79*  ALBUMIN 2.0*   < > 1.9*   < > 2.2*   < > 2.6*  --  2.4*  --  2.3*  2.3*  --   --  2.5* 2.6* 2.3*  CALCIUM 7.9*   < > 7.6*   < > 7.5*   < > 7.6*  --  7.7*  --  7.7* 7.7*  --   --  7.4* 7.8* 7.2*  PHOS  --    < >  --    < >  --   --  2.5  --  2.1*  --  3.0 2.5  --   --  2.4* 2.3* 2.5  AST 35  --  33  --  39  --   --   --   --   --   --   --   --   --   --   --   --  ALT 20  --  24  --  25  --   --   --   --   --   --   --   --   --   --   --   --    < > = values in this interval not displayed.   Liver Function Tests: Recent Labs  Lab 04/02/19 0349 04/02/19 1612 04/03/19 0548 04/03/19 1600 04/04/19 0355 04/04/19 1457 04/07/19 0431 04/07/19 1407 04/08/19 0323  AST 35  --  33  --  39  --   --   --   --   ALT 20  --  24  --  25  --   --   --   --   ALKPHOS 74  --  76  --  73  --   --   --   --   BILITOT 0.9  --  0.9  --  0.9  --   --   --   --   PROT 5.5*  --  5.3*  --  5.5*  --   --   --   --   ALBUMIN 2.0*   < > 1.9*   < > 2.2*   < > 2.5* 2.6* 2.3*   < > = values in this interval not displayed.   No results for input(s): LIPASE, AMYLASE in the last 168 hours. No results for input(s): AMMONIA in the last 168 hours. CBC: Recent Labs  Lab 04/05/19 0505 04/05/19 0540 04/06/19 0404 04/06/19 0424 04/06/19 1233 04/06/19 1233 04/07/19 0405 04/07/19 0430 04/08/19 0323  WBC 17.3*   < > 17.5*   < > 16.6*  --   --  12.3* 11.5*  NEUTROABS  --   --   --   --  13.7*  --   --   --   --   HGB 8.3*   < > 9.0*   < > 8.4*   < > 7.1* 7.1* 7.8*  HCT 25.2*   < > 28.0*   < > 26.0*   < > 21.0* 22.5* 24.8*  MCV 92.3  --  91.2  --  91.5  --   --  91.8 92.9  PLT 80*   < > 83*   < > 74*  --   --  71* 84*   < > = values in this interval not displayed.   Cardiac Enzymes: No results for input(s): CKTOTAL, CKMB, CKMBINDEX, TROPONINI in the last 168 hours. CBG: Recent Labs  Lab 04/07/19 1721 04/07/19 1941 04/07/19 2342 04/08/19 0330 04/08/19 0800  GLUCAP 182* 152* 129* 192* 164*    Iron Studies: No results for input(s): IRON, TIBC, TRANSFERRIN, FERRITIN in  the last 72 hours. Studies/Results: DG Chest Port 1 View  Result Date: 04/08/2019 CLINICAL DATA:  CABG EXAM: PORTABLE CHEST 1 VIEW COMPARISON:  Yesterday FINDINGS: Impella device from the right with stable positioning. Bilateral central line with tips at the SVC. CABG and left atrial clipping. Haziness of the left more than right chest likely from atelectasis and pleural fluid. No visible pneumothorax. The tracheostomy tube is in good position. IMPRESSION: 1. Stable hardware. 2. Stable mild hazy opacity likely reflecting dependent atelectasis and pleural fluid. Electronically Signed   By: Monte Fantasia M.D.   On: 04/08/2019 06:11   DG Chest Port 1 View  Result Date: 04/07/2019 CLINICAL DATA:  Post CABG EXAM: PORTABLE CHEST 1 VIEW COMPARISON:  Chest radiograph from one day prior.  FINDINGS: Tracheostomy tube tip overlies the tracheal air column at the level of the thoracic inlet. Enteric tube enters stomach with the tip not seen on this image. Right axillary approach Impella device is stable with tip overlying the left heart. Left subclavian central venous catheter terminates in the middle third of the SVC. Stable cardiomediastinal silhouette with top-normal heart size. No pneumothorax. Small bilateral pleural effusions are unchanged. No overt pulmonary edema. Left lung base atelectasis is unchanged. IMPRESSION: 1. No pneumothorax.  Support structures as detailed. 2. Stable small bilateral pleural effusions and left lung base atelectasis. Electronically Signed   By: Ilona Sorrel M.D.   On: 04/07/2019 06:31   . aspirin  81 mg Per Tube Daily  . atorvastatin  80 mg Per Tube q1800  . B-complex with vitamin C  1 tablet Oral Daily  . chlorhexidine gluconate (MEDLINE KIT)  15 mL Mouth Rinse BID  . Chlorhexidine Gluconate Cloth  6 each Topical Daily  . feeding supplement (PRO-STAT SUGAR FREE 64)  60 mL Per Tube BID  . Gerhardt's butt cream   Topical BID  . hydrocortisone   Rectal BID  . insulin aspart   0-24 Units Subcutaneous Q4H  . insulin aspart  5 Units Subcutaneous Q4H  . insulin glargine  26 Units Subcutaneous Daily  . mouth rinse  15 mL Mouth Rinse 10 times per day  . midodrine  10 mg Oral TID WC  . pantoprazole (PROTONIX) IV  40 mg Intravenous Q24H  . sodium chloride flush  10-40 mL Intracatheter Q12H  . tamsulosin  0.4 mg Oral Daily    BMET    Component Value Date/Time   NA 132 (L) 04/08/2019 0323   K 4.4 04/08/2019 0323   CL 101 04/08/2019 0323   CO2 24 04/08/2019 0323   GLUCOSE 198 (H) 04/08/2019 0323   BUN 36 (H) 04/08/2019 0323   CREATININE 1.79 (H) 04/08/2019 0323   CALCIUM 7.2 (L) 04/08/2019 0323   GFRNONAA 33 (L) 04/08/2019 0323   GFRAA 38 (L) 04/08/2019 0323   CBC    Component Value Date/Time   WBC 11.5 (H) 04/08/2019 0323   RBC 2.67 (L) 04/08/2019 0323   HGB 7.8 (L) 04/08/2019 0323   HCT 24.8 (L) 04/08/2019 0323   PLT 84 (L) 04/08/2019 0323   MCV 92.9 04/08/2019 0323   MCH 29.2 04/08/2019 0323   MCHC 31.5 04/08/2019 0323   RDW 17.4 (H) 04/08/2019 0323   LYMPHSABS 0.5 (L) 04/06/2019 1233   MONOABS 1.9 (H) 04/06/2019 1233   EOSABS 0.2 04/06/2019 1233   BASOSABS 0.0 04/06/2019 1233    Assessment/Plan:  1. AKI(Cr was 0.7 prior to surgery)- due to postoperative CHF/cardiogenic shock that was refractory to diuretics and started on CRRT on 03/29/19 for volume removal. Tolerating CRRT with volume removal of 200 ml/hr. UF limited by low BP but is down 9 kg since starting CRRT. 1. I's/O's difficult to assess due to bladder irrigations and malfunction of bed scale.  UF held last night due to hypotension. 2. Discussed case with Dr. Haroldine Laws and plan is to get CVP to 5-8 while not having to increase pressors in the hopes of weaning impella. So will continue to UF as tolerated for now. 3. Patient is now expressing desire to stop aggressive measures and appreciate palliative care consult to help set goals/limits of care. 4. Will continue with CVVHD for now  but recommend stopping in the next 24 hours as he has deteriorated despite volume removal 2. Acute  systolic CHF/cardiogenic shock- EF 25-30% currently on Impella. Continue with CVVHD and UF as tolerated. 3. CAD s/p CABG on 7/54/49 complicated by cardiogenic shock and ARF. 4. Acute hypoxic respiratory failure with pseudomonas pneumonia- s/p trach on 03/27/19. Cont with fortaz per PCCM 5. PAF- s/p MAZE and LAA occlusion- on amio per CHF team 6. Hematuria- bladder irrigation 7. ABLA- Has Melena. Transfuse as needed. Received 2 units PRBC's overnight with appropriate increase in Hgb from 7.6 to 10.2. Continue to follow H/H and transfuse prn.  8. Protein malnutrition- moderate to severe 9. Hypophosphatemia-repleted yesterday.  Continue to follow. 10. Vascular access- left  HD catheter placed 03/29/19 11. Disposition- poor overall prognosis. He is not a longterm dialysis candidate given his cardiogenic shock and trach. Recommend palliative care consult to help set goals/limits of care and transition to comfort measures.  Donetta Potts, MD [employer] [phone]

## 2019-04-08 NOTE — Consult Note (Signed)
Consultation Note Date: 04/08/2019   Patient Name: Jesse Murphy  DOB: 1928-08-08  MRN: [id number]  Age / Sex: 86 y.o., male  PCP: Sandi Mealy, MD Referring Physician: Prescott Gum, Collier Salina, MD  Reason for Consultation: Establishing goals of care and Psychosocial/spiritual support  HPI/Patient Profile: 86 y.o. male  with past medical history of coronary artery disease status post PCI, prostate cancer, non-Hodgkin's lymphoma, diabetes mellitus, diastolic heart failure who was admitted on 03/13/2019 with afib RVR in the 200s.  The RVR was refractory to defibrillation and adenosine.  Work up revealed ACS (accerating angina) and worsening heart failure (EF 30 -35%).  He underwent CABG on 1/28.  He was able to be extubated but was re-intubated on 2/8 and required tracheostomy.  On 2/9 Impella device was inserted.  His post op course was difficult developing hypotension, worsening renal failure, GI bleeding and hematuria.  He is now on continuous renal replacement therapy.  Pressors have been initiated and boluses given to support his blood pressure on dialysis.  He has been diagnosed with a pseudomonas infection from tracheal aspirate.  Per Epic notes on multiple occassions the patient has seemed to indicate that he did not want a particular intervention or that he wanted to stop all aggressive care.  Then after speaking with his son he moves forward with the interventions.  Clinical Assessment and Goals of Care:  I have reviewed medical records including EPIC notes, labs and imaging, received report from the bedside RN and CCM, examined the patient and met at bedside with his son Elenore Rota to discuss diagnosis prognosis, [place], EOL wishes, disposition and options.  I introduced Palliative Medicine as specialized medical care for people living with serious illness. It focuses on providing relief from the symptoms and stress of  a serious illness.   We discussed a brief life review of the patient. Eugune had a long career in the [employer].  He fought in [place].  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  Consequently he is very hard of hearing.   After the [employer] the patient worked for [employer].  He and his wife [name] raised [place] and enjoyed a life of hard work and family life.  He is a [place] man and loves Psalms.  He listens to "[employer]" music.   The couple has two children Elenore Rota and Vinton.  Jackelyn Poling has been very helpful to her father with Oncology and Cardiology appointments.     In 2020 the patient's wife [name] had several strokes and needed help with ADLs. Bellamy is her primary care taker.  Fortunately Debbie and her family live next door to the patient and Elenore Rota lives just 6 miles away. The family is very close knit.  As far as functional and nutritional status Cherry was fully independent with his ADLs prior to admission and provided care for his wife.  He had become SOB more frequently leading up to this hospitalization but Elenore Rota suggests he didn't seek care because he was too busy  caring for his wife.  We discussed his current illness and what it means in the larger context of his on-going co-morbidities.  Natural disease trajectory and expectations at EOL were discussed.  Elenore Rota understand that we are fully supporting his father's lungs, kidneys and heart.  We are providing a tremendous amount of support.  Elenore Rota believes the impella device will be turned off on Monday and things will start improving.  I cautioned Elenore Rota that the doctors will make an assessment about whether or not Ramil has improved enough to turn off the impella device on Monday.  After Dr. Prescott Gum has evaluated him we can discuss immediate next steps.  I talked with Elenore Rota about praying for the best but preparing for the worst.  I assured Elenore Rota that if his father does not improve medically we  would continue support him and ensure his comfort.  As he is on so much artificial support, he would likely pass here in the hospital.  Elenore Rota expressed understanding.  We made a plan for another conversation tomorrow with the family including wife and daughter along with Elenore Rota.  Questions and concerns were addressed.  The family was encouraged to call with questions or concerns.   Primary Decision Maker:  PATIENT  Surrogate decision maker is his wife who will come in to visit and have a PMT meeting tomorrow at 2:00.    SUMMARY OF RECOMMENDATIONS    PMT to hopefully confer with Dr. Prescott Gum after he has evaluated Mr. Endicott on Monday morning 2/22. Family to come to the hospital for a PMT meeting at bedside on 2/22.  Code Status/Advance Care Planning:  Full   Symptom Management:   Per primary  Additional Recommendations (Limitations, Scope, Preferences):  Full Scope Treatment  Palliative Prophylaxis:   Frequent Pain Assessment  Psycho-social/Spiritual:   Desire for further Chaplaincy support:  Appreciated   Prognosis:  To be determined.   Discharge Planning: To Be Determined      Primary Diagnoses: Present on Admission: . Atrial fibrillation with rapid ventricular response ([place]) . GERD (gastroesophageal reflux disease) . Non Hodgkin's lymphoma ([place]) . Chronic diastolic heart failure ([place]) . Hypertension . Atrial fibrillation with RVR ([place])   I have reviewed the medical record, interviewed the patient and family, and examined the patient. The following aspects are pertinent.  Past Medical History:  Diagnosis Date  . Atrial fibrillation ([place])   . CAD (coronary artery disease)    a. s/p PCI in 1992 b. Coronary CT in 01/2018 showing extensive coronary calcification; FFR indeterminate --> medical management pursued at that time as patient not interested in repeat cath  . Cancer [facility])    prostate  . Dyspnea   . Hypertension    Social History   Socioeconomic  History  . Marital status: Married    Spouse name: Not on file  . Number of children: 2  . Years of education: Not on file  . Highest education level: Not on file  Occupational History  . Occupation: Librarian, academic in supply    Comment: [place]  Tobacco Use  . Smoking status: Former Smoker    Packs/day: 0.25    Years: 50.00    Pack years: 12.50    Types: Cigarettes    Quit date: 02/15/1990    Years since quitting: 29.1  . Smokeless tobacco: Never Used  Substance and Sexual Activity  . Alcohol use: No  . Drug use: No  . Sexual activity: Not Currently  Other Topics Concern  .  Not on file  Social History Narrative  . Not on file   Social Determinants of Health   Financial Resource Strain:   . Difficulty of Paying Living Expenses: Not on file  Food Insecurity:   . Worried About Charity fundraiser in the Last Year: Not on file  . Ran Out of Food in the Last Year: Not on file  Transportation Needs:   . Lack of Transportation (Medical): Not on file  . Lack of Transportation (Non-Medical): Not on file  Physical Activity:   . Days of Exercise per Week: Not on file  . Minutes of Exercise per Session: Not on file  Stress:   . Feeling of Stress : Not on file  Social Connections:   . Frequency of Communication with Friends and Family: Not on file  . Frequency of Social Gatherings with Friends and Family: Not on file  . Attends Religious Services: Not on file  . Active Member of Clubs or Organizations: Not on file  . Attends Archivist Meetings: Not on file  . Marital Status: Not on file   Family History  Problem Relation Age of Onset  . CAD Mother 25  . CAD Father 14  . CAD Sister   . CAD Brother    Scheduled Meds: . aspirin  81 mg Per Tube Daily  . atorvastatin  80 mg Per Tube q1800  . B-complex with vitamin C  1 tablet Oral Daily  . chlorhexidine gluconate (MEDLINE KIT)  15 mL Mouth Rinse BID  . Chlorhexidine Gluconate Cloth  6 each Topical Daily  . feeding  supplement (PRO-STAT SUGAR FREE 64)  60 mL Per Tube BID  . Gerhardt's butt cream   Topical BID  . hydrocortisone   Rectal BID  . insulin aspart  0-24 Units Subcutaneous Q4H  . insulin aspart  5 Units Subcutaneous Q4H  . insulin glargine  26 Units Subcutaneous Daily  . mouth rinse  15 mL Mouth Rinse 10 times per day  . midodrine  10 mg Oral TID WC  . pantoprazole (PROTONIX) IV  40 mg Intravenous Q24H  . sodium chloride flush  10-40 mL Intracatheter Q12H  . tamsulosin  0.4 mg Oral Daily   Continuous Infusions: .  prismasol BGK 4/2.5 400 mL/hr at 04/08/19 1037  .  prismasol BGK 4/2.5 200 mL/hr at 04/08/19 1354  . sodium chloride    . sodium chloride Stopped (04/04/19 1736)  . sodium chloride 10 mL/hr at 03/29/19 2128  . amiodarone 30 mg/hr (04/08/19 1500)  . feeding supplement (VITAL 1.5 CAL) 50 mL/hr at 04/08/19 0500  . impella catheter heparin 25 unit/mL in dextrose 5%    . meropenem (MERREM) IV Stopped (04/08/19 1440)  . norepinephrine (LEVOPHED) Adult infusion 11 mcg/min (04/08/19 1500)  . prismasol BGK 4/2.5 1,500 mL/hr at 04/08/19 1225  . vasopressin (PITRESSIN) infusion - *FOR SHOCK* 0.03 Units/min (04/08/19 1500)   PRN Meds:.sodium chloride, oxyCODONE **AND** acetaminophen, dextrose, fentaNYL (SUBLIMAZE) injection, heparin, heparin, midazolam, ondansetron (ZOFRAN) IV, sodium chloride flush, sodium chloride flush No Known Allergies  Review of Systems patient unable to give.  Physical Exam  Well developed male, awake appears alert on trach/vent support, unable to speak CV rrr resp no distress Abdomen soft, nt,nd Extremities are edematous  Vital Signs: BP (!) 108/57   Pulse 78   Temp 98.1 F (36.7 C) (Oral)   Resp (!) 27   Ht '5\' 8"'  (1.727 m)   Wt 87.7 kg   SpO2 100%  BMI 29.40 kg/m  Pain Scale: 0-10 POSS *See Group Information*: 1-Acceptable,Awake and alert Pain Score: Asleep   SpO2: SpO2: 100 % O2 Device:SpO2: 100 % O2 Flow Rate: .O2 Flow Rate (L/min): 10  L/min  IO: Intake/output summary:   Intake/Output Summary (Last 24 hours) at 04/08/2019 1514 Last data filed at 04/08/2019 1500 Gross per 24 hour  Intake 12236.21 ml  Output 7570 ml  Net 4666.21 ml    LBM: Last BM Date: 04/07/19 Baseline Weight: Weight: 92.1 kg Most recent weight: Weight: (Unable due to bed error)     Palliative Assessment/Data: 20%     Time In: 11:00 Time Out: 12:10 Time Total: 70 min. Visit consisted of counseling and education dealing with the complex and emotionally intense issues surrounding the need for palliative care and symptom management in the setting of serious and potentially life-threatening illness. Greater than 50%  of this time was spent counseling and coordinating care related to the above assessment and plan.  Signed by: Florentina Jenny, PA-C Palliative Medicine  Please contact Palliative Medicine Team phone at [phone] for questions and concerns.  For individual provider: See Shea Evans

## 2019-04-08 NOTE — Progress Notes (Signed)
Patient ID: Jesse Murphy, male   DOB: 1928/11/30, 85 y.o.   MRN: [id number]    Advanced Heart Failure Rounding Note  PCP-Cardiologist: Kate Sable, MD   Subjective:    Remains on impella 5.5 support, P-5, Flow 3.8. Co-ox 71%.  On vent through trach. Respiratory culture with pseudomonas. Treated w/ Tressie Ellis and later changed to vancomycin. WBC ct 13 => 11.5, CXR with left base infiltrate.  Afebrile now, started meropenem with fever on 2/20.   Started CVVHD 2/11. UF limited by MAP, intermittently able to pull up to 50 cc/hr.  Today, CVP 7.   Unable to track UOP. He is requiring continuous bladder irrigation for hematuria/ clot retention. Systemic Heparin off. Continues w/heparin in impella purge. Urology following. Foley contents still blood tinged. Hgb 7.8 today, getting 1 unit PRBCs.    Remains on vasopressin 0.03 units + NE 11 mcg.  Difficult to obtain BP, patient refused arterial line overnight, CCM to discuss again today.   He remains on amiodarone 30.   Last night, patient said he was "tired" and wanted to back off aggressive care.  However, after conversation with son decided to push forwards.  He does not want arterial line at this point.    Objective:   Weight Range: 87.7 kg Body mass index is 29.4 kg/m.   Vital Signs:   Temp:  [98 F (36.7 C)-100.8 F (38.2 C)] 98.1 F (36.7 C) (02/21 0825) Pulse Rate:  [54-104] 80 (02/21 0915) Resp:  [17-35] 22 (02/21 0915) BP: (52-139)/(39-101) 83/55 (02/21 0915) SpO2:  [82 %-100 %] 100 % (02/21 0915) Arterial Line BP: (92-113)/(42-50) 108/46 (02/20 1400) FiO2 (%):  [40 %] 40 % (02/21 0825) Last BM Date: 04/07/19  Weight change: Filed Weights   04/05/19 0450 04/06/19 1400  Weight: 92 kg 87.7 kg    Intake/Output:   Intake/Output Summary (Last 24 hours) at 04/08/2019 0920 Last data filed at 04/08/2019 0900 Gross per 24 hour  Intake 12269.74 ml  Output 9403 ml  Net 2866.74 ml      Physical Exam  CVP 7 General:  Trach with vent Neck: No JVD, no thyromegaly or thyroid nodule.  Lungs: Decreased at bases. CV: Nondisplaced PMI.  Heart regular S1/S2, no S3/S4, no murmur.  No peripheral edema.   Abdomen: Soft, nontender, no hepatosplenomegaly, no distention.  Skin: Intact without lesions or rashes.  Neurologic: Awake, not very interactive.  Extremities: No clubbing or cyanosis.  HEENT: Normal.    Telemetry   NSR in 80s (personally reviewed)  Labs    CBC Recent Labs    04/06/19 1233 04/07/19 0405 04/07/19 0430 04/08/19 0323  WBC 16.6*  --  12.3* 11.5*  NEUTROABS 13.7*  --   --   --   HGB 8.4*   < > 7.1* 7.8*  HCT 26.0*   < > 22.5* 24.8*  MCV 91.5  --  91.8 92.9  PLT 74*  --  71* 84*   < > = values in this interval not displayed.   Basic Metabolic Panel Recent Labs    04/07/19 0430 04/07/19 0431 04/07/19 1407 04/08/19 0323  NA  --    < > 135 132*  K  --    < > 4.7 4.4  CL  --    < > 101 101  CO2  --    < > 25 24  GLUCOSE  --    < > 132* 198*  BUN  --    < > 36*  36*  CREATININE  --    < > 1.85* 1.79*  CALCIUM  --    < > 7.8* 7.2*  MG 2.3  --   --  2.3  PHOS  --    < > 2.3* 2.5   < > = values in this interval not displayed.   Liver Function Tests Recent Labs    04/07/19 1407 04/08/19 0323  ALBUMIN 2.6* 2.3*   No results for input(s): LIPASE, AMYLASE in the last 72 hours. Cardiac Enzymes No results for input(s): CKTOTAL, CKMB, CKMBINDEX, TROPONINI in the last 72 hours.  BNP: BNP (last 3 results) Recent Labs    03/22/19 0401 03/26/19 0626 03/27/19 1255  BNP 340.4* 687.5* 800.4*    ProBNP (last 3 results) No results for input(s): PROBNP in the last 8760 hours.   D-Dimer No results for input(s): DDIMER in the last 72 hours. Hemoglobin A1C No results for input(s): HGBA1C in the last 72 hours. Fasting Lipid Panel No results for input(s): CHOL, HDL, LDLCALC, TRIG, CHOLHDL, LDLDIRECT in the last 72 hours. Thyroid Function Tests No results for input(s): TSH,  T4TOTAL, T3FREE, THYROIDAB in the last 72 hours.  Invalid input(s): FREET3  Other results:   Imaging    DG Chest Port 1 View  Result Date: 04/08/2019 CLINICAL DATA:  CABG EXAM: PORTABLE CHEST 1 VIEW COMPARISON:  Yesterday FINDINGS: Impella device from the right with stable positioning. Bilateral central line with tips at the SVC. CABG and left atrial clipping. Haziness of the left more than right chest likely from atelectasis and pleural fluid. No visible pneumothorax. The tracheostomy tube is in good position. IMPRESSION: 1. Stable hardware. 2. Stable mild hazy opacity likely reflecting dependent atelectasis and pleural fluid. Electronically Signed   By: Monte Fantasia M.D.   On: 04/08/2019 06:11     Medications:     Scheduled Medications: . aspirin  81 mg Per Tube Daily  . atorvastatin  80 mg Per Tube q1800  . B-complex with vitamin C  1 tablet Oral Daily  . chlorhexidine gluconate (MEDLINE KIT)  15 mL Mouth Rinse BID  . Chlorhexidine Gluconate Cloth  6 each Topical Daily  . feeding supplement (PRO-STAT SUGAR FREE 64)  60 mL Per Tube BID  . Gerhardt's butt cream   Topical BID  . hydrocortisone   Rectal BID  . insulin aspart  0-24 Units Subcutaneous Q4H  . insulin aspart  5 Units Subcutaneous Q4H  . insulin glargine  26 Units Subcutaneous Daily  . mouth rinse  15 mL Mouth Rinse 10 times per day  . midodrine  10 mg Oral TID WC  . pantoprazole (PROTONIX) IV  40 mg Intravenous Q24H  . sodium chloride flush  10-40 mL Intracatheter Q12H  . tamsulosin  0.4 mg Oral Daily    Infusions: .  prismasol BGK 4/2.5 400 mL (04/07/19 2117)  .  prismasol BGK 4/2.5 200 mL/hr at 04/07/19 1327  . sodium chloride    . sodium chloride Stopped (04/04/19 1736)  . sodium chloride 10 mL/hr at 03/29/19 2128  . amiodarone 30 mg/hr (04/08/19 0907)  . feeding supplement (VITAL 1.5 CAL) 50 mL/hr at 04/08/19 0500  . impella catheter heparin 25 unit/mL in dextrose 5%    . meropenem (MERREM) IV Stopped  (04/08/19 4481)  . norepinephrine (LEVOPHED) Adult infusion 11 mcg/min (04/08/19 0906)  . prismasol BGK 4/2.5 1,500 mL/hr at 04/08/19 0907  . vasopressin (PITRESSIN) infusion - *FOR SHOCK* 0.03 Units/min (04/08/19 0900)    PRN  Medications: sodium chloride, oxyCODONE **AND** acetaminophen, dextrose, fentaNYL (SUBLIMAZE) injection, heparin, heparin, midazolam, ondansetron (ZOFRAN) IV, sodium chloride flush, sodium chloride flush    Assessment/Plan   1. Acute systolic HF -> Cardiogenic shock - post-op echo on 03/20/19 EF 25-30% (pre-op 30-35%. In 12/2017 EF normal) - Remains on Impella support P-5 Waveforms ok LDH stable 245.  Co-ox 71% today. I will continue current level of support with soft BP.  - Continue VP 0.03  and NE 11 mcg, have had to go up on norepinephrine. Likely vasodilated with CVVHD and Impella, may have been septic component yesterday with fever, now afebrile. - Continue CVVHD for volume removal. CVP now down to 7, think we are at goal. Continue gentle UF to keep him net negative. Hard to pull fluid with soft MAP.  - Plan had been to wean Impella over the weekend with hopeful extraction on Monday. Once Impella out then would try to wean inotropes and hope for renal recovery (suspect unlikely) or ability to tolerate iHD (suspect this would be difficult). If fails Impella or inotrope wean or unable to tolerate iHD would then move to comfort care. Patient is still requiring pressors and is ambivalent about ongoing aggressive care.  Discussed with Drs Marval Regal and Maryjean Ka, think that overall prognosis is quite poor here (unlikely to be able to thrive off RRT), will have palliative care see him.   2. CAD s/p CABG this admit (LIMA->LAD, SVG->OM, SVG->RCA on 03/15/19) - No current s/s angina - Continue ASA/ticagrelor/statin  3. Acute hypoxic respiratory failure with Pseudomonas PNA - s/p trach on 03/27/19 - WBC 36>26>17>13>11.5. No fever since yesterday am. CXR with left base  infiltrate today.  - initially treated w/ Tressie Ellis and changed to Vanc, now off antibiotics.  Clinically very tenuous, with recurrent fever will culture blood and trach aspirate, was started on meropenem empirically yesterday.    - CCM managing vent   4. PAF - s/p MAZE and LAA occlusion - In/out of AF. Remains in NSR  -continue IV amio 30 mg/hr.  - Off systemic heparin with hematuria   5. AKI  - due to shock/ATN - Creatinine peaked at 5.04 - Started [facility] 03/29/19 per Nephrology.  - CVP down to 7, will keep gentle UF for now, think volume is close to goal.  - Ongoing pressor requirement, I am not optimistic about his ability to come off RRT.   6. Hematuria - LDH ok. Off heparin drip.  - Getting continuous bladder irrigation.   - Urology following.   7. Melena - No melena overnight.   8. Anemia -Hematuria/melena -Hgb 7.6 -->10.2 ->8.9 >6.8 >8.3>7.8>9.0>9.5 > 7.1>7.8 - s/p transfusion x 1 unit 2/18, to get 1 unit this morning.   CRITICAL CARE Performed by: Loralie Champagne  Total critical care time: 35 minutes  Critical care time was exclusive of separately billable procedures and treating other patients.  Critical care was necessary to treat or prevent imminent or life-threatening deterioration.  Critical care was time spent personally by me (independent of midlevel providers or residents) on the following activities: development of treatment plan with patient and/or surrogate as well as nursing, discussions with consultants, evaluation of patient's response to treatment, examination of patient, obtaining history from patient or surrogate, ordering and performing treatments and interventions, ordering and review of laboratory studies, ordering and review of radiographic studies, pulse oximetry and re-evaluation of patient's condition.   Loralie Champagne, MD  9:20 AM

## 2019-04-08 NOTE — Progress Notes (Signed)
Patient ID: Jesse Murphy, male   DOB: 1928-08-07, 86 y.o.   MRN: [id number]  12 Days Post-Op Subjective: S/P 1 unit PRBCs yesterday for Hgb of 7.1.    Objective: Vital signs in last 24 hours: Temp:  [98 F (36.7 C)-100.8 F (38.2 C)] 98.1 F (36.7 C) (02/21 0825) Pulse Rate:  [54-104] 79 (02/21 1050) Resp:  [17-35] 27 (02/21 1050) BP: (46-139)/(20-101) 69/48 (02/21 1050) SpO2:  [82 %-100 %] 100 % (02/21 1050) Arterial Line BP: (106-108)/(46-50) 108/46 (02/20 1400) FiO2 (%):  [40 %] 40 % (02/21 0825)  Intake/Output from previous day: 02/20 0701 - 02/21 0700 In: 12255.3 [I.V.:976.3; Blood:315; NG/GT:1250; IV Piggyback:288.1] Out: 10216 [Urine:7410; Emesis/NG output:100] Intake/Output this shift: Total I/O In: 3498.5 [I.V.:116; Other:3040.2; NG/GT:300; IV Piggyback:42.3] Out: 9191 [Urine:1000; Other:372]  Physical Exam:  General: Alert and oriented GU: Urine red tinged, draining well now on mild CBI drip  Lab Results: Recent Labs    04/07/19 0405 04/07/19 0430 04/08/19 0323  HGB 7.1* 7.1* 7.8*  HCT 21.0* 22.5* 24.8*   BMET Recent Labs    04/07/19 1407 04/08/19 0323  NA 135 132*  K 4.7 4.4  CL 101 101  CO2 25 24  GLUCOSE 132* 198*  BUN 36* 36*  CREATININE 1.85* 1.79*  CALCIUM 7.8* 7.2*     Studies/Results:   Assessment/Plan: 1) Hematuria: Unclear etiology.  Hgb improved from 7.1 to 7.8 after transfusion of 1 unit yesterday.  Continue to wean CBI as tolerated.  There have been discussions about palliative care vs ongoing aggressive treatment.  Will consider that decision before recommending any further evaluation for source of hematuria.    LOS: 25 days   Dutch Gray 04/08/2019, 10:59 AM

## 2019-04-08 NOTE — Progress Notes (Signed)
Pt placed on [employer].  No complications & No distress noted.

## 2019-04-08 NOTE — Progress Notes (Signed)
NAME:  Jesse Murphy, MRN:  [id number], DOB:  Jun 06, 1928, LOS: 62 ADMISSION DATE:  03/13/2019, CONSULTATION DATE:  2/2 REFERRING MD:  Nils Pyle, CHIEF COMPLAINT:  Dyspnea   Brief History   86 y/o male admitted 1/26, underwent CABG and L atrial appendage clipping on 1/29, moved to ICU on 2/1 for intubation.  Past Medical History  A fib, CAD, Prostate cancer, HTN  Significant Hospital Events   1/29 CABG 2/01 to ICU 2/05 Extubated 2/08 Reintubated 2/09 tracheostomy 2/09 Impella, PA catheter placed 2/11 CRRT initiated 2/16 leukocytosis, add vancomycin 2/17 start TC trials 2/19 urology placed 3 way foley, bleeding at rectal tube site  Consults:  Nephrology CHF team Urology  Procedures:  Rt PICC 2/02 >> Trach 2/09 >> PA catheter 2/09 >> Lt [place] HD cath 2/11 >> L Rad Aline 2/3 >>  Impella 2/10 >>  Significant Diagnostic Tests:  Echo 1/26 >> EF 30 to 35%  Micro Data:  SARS CoV2 PCR 1/26 >> negative Influenza PCR 1/26 >> negative Sputum 2/01 >> oral flora Sputum 2/08 >> Pseudomonas >> pan sensitive  Blood 2/16 >> Sputum 2/16 >> Pseudomonas sens to cipro, gent, imepenem  Antimicrobials:  Rocephin 2/01 >> 2/07 Cefepime 2/08 >> 2/10 Fortaz 2/10 >> 2/16 Vancomycin 2/16 >>  Meropenem 2/20..  Interim history/subjective:  Denies pain this a.m., intermittently treating catheter-related pain with oxycodone. See notes from yesterday, but patient struggling with continuing aggressive care. Asked to stop all procedures yesterday. Have explained impetus for/inportance of an a-line again this morning but pt understands that he can make decisions re: his care.  Objective   Blood pressure (!) 83/55, pulse 80, temperature 98.1 F (36.7 C), temperature source Oral, resp. rate (!) 22, height 5\' 8"  (1.727 m), weight 87.7 kg, SpO2 100 %. CVP:  [0 mmHg-22 mmHg] 6 mmHg  Vent Mode: PSV;CPAP FiO2 (%):  [40 %] 40 % Set Rate:  [18 bmp] 18 bmp Vt Set:  [540 mL] 540 mL PEEP:  [5 cmH20] 5  cmH20 Pressure Support:  [8 cmH20] 8 cmH20 Plateau Pressure:  [12 cmH20-13 cmH20] 13 cmH20   Intake/Output Summary (Last 24 hours) at 04/08/2019 0920 Last data filed at 04/08/2019 0900 Gross per 24 hour  Intake 12269.74 ml  Output 9403 ml  Net 2866.74 ml   Filed Weights   04/05/19 0450 04/06/19 1400  Weight: 92 kg 87.7 kg    Examination:  General - alert, answers y/n, denies pain this a.m. Eyes - PERRLA, EOMI ENT - trach site clean Cardiac - regular rate/rhythm, no murmur, impella sounds Chest - scattered rhonchi Abdomen - soft, non tender, + bowel sounds Extremities - 2+ edema, UEs, 1+ LEs; moves all on request Skin - no rashes Neuro - CN II-XII grossly intact to controntation, tongue midline, written communication, coordination intact. RASS 0   Resolved Hospital Problem list     Assessment & Plan:   Acute Hypoxemic Respiratory Failure due to Acute pulmonary edema complicated by Pseudomonas HCAP. - wean TC as able with rest on PRVC as needed - prn BDs - f/u CXR intermittently  Cardiogenic Shock with acute systolic heart failure. New onset atrial fibrillation >> back in sinus rhythm. - impella, pressors per TCTS and CHF team - continue ASA, lipitor, midodrine, amiodarone - heparin gtt on hold in setting of hematuria and rectal bleeding --palliative consult today as patient has indicated a desire to stop at least certain treatments; needs established goals of care and family agreement  AKI secondary to Cardiogenic  Shock. - CRRT per renal  Anemia of critical illness. - f/u CBC - transfuse for Hb < 7 or significant bleeding  Rectal bleeding. - noted 2/19 - likely from rectal tube irritation - rectal tube removed - monitor for further signs of bleeding  Hematuria with retained clots. - 3 way catheter with flushing by urology  DM2 with hyperglycemia, poorly controlled - SSI with lantus and tube feed coverge  Acute metabolic encephalopathy due to shock, renal  failure, hypoxemia - RASS goal 0; currently oriented,   Pressure wounds / Reported as Skin Tear 2/15 -WOC following   Leukocytosis  - vancomycin per primary team   Best practice:  Diet: TF DVT prophylaxis: heparin infusion GI prophylaxis: PPI Mobility: bed rest Code Status: full Disposition: ICU  Labs    CMP Latest Ref Rng & Units 04/08/2019 04/07/2019 04/07/2019  Glucose 70 - 99 mg/dL 198(H) 132(H) 154(H)  BUN 8 - 23 mg/dL 36(H) 36(H) 37(H)  Creatinine 0.61 - 1.24 mg/dL 1.79(H) 1.85(H) 2.06(H)  Sodium 135 - 145 mmol/L 132(L) 135 135  Potassium 3.5 - 5.1 mmol/L 4.4 4.7 4.9  Chloride 98 - 111 mmol/L 101 101 100  CO2 22 - 32 mmol/L 24 25 23   Calcium 8.9 - 10.3 mg/dL 7.2(L) 7.8(L) 7.4(L)  Total Protein 6.5 - 8.1 g/dL - - -  Total Bilirubin 0.3 - 1.2 mg/dL - - -  Alkaline Phos 38 - 126 U/L - - -  AST 15 - 41 U/L - - -  ALT 0 - 44 U/L - - -    CBC Latest Ref Rng & Units 04/08/2019 04/07/2019 04/07/2019  WBC 4.0 - 10.5 K/uL 11.5(H) 12.3(H) -  Hemoglobin 13.0 - 17.0 g/dL 7.8(L) 7.1(L) 7.1(L)  Hematocrit 39.0 - 52.0 % 24.8(L) 22.5(L) 21.0(L)  Platelets 150 - 400 K/uL 84(L) 71(L) -    ABG    Component Value Date/Time   PHART 7.488 (H) 04/07/2019 0405   PCO2ART 34.4 04/07/2019 0405   PO2ART 101.0 04/07/2019 0405   HCO3 25.9 04/07/2019 0405   TCO2 27 04/07/2019 0405   ACIDBASEDEF 1.0 04/05/2019 0540   O2SAT 71.2 04/08/2019 0323    CBG (last 3)  Recent Labs    04/07/19 2342 04/08/19 0330 04/08/19 0800  GLUCAP 129* 192* 164*     I have independently seen and examined the patient, reviewed data, and developed an assessment and plan. A total of 42 minutes were spent in critical care assessment and medical decision making. This critical care time does not reflect procedure time, or teaching time or supervisory time of PA/NP/Med student/Med Resident, etc but could involve care discussion time.  Bonna Gains, MD PhD 04/08/19 9:26 AM

## 2019-04-08 NOTE — Progress Notes (Signed)
   04/08/19 1445  Clinical Encounter Type  Visited With Patient and family together  Visit Type Follow-up  Referral From Nurse  Consult/Referral To Chaplain  Spiritual Encounters  Spiritual Needs Prayer;Emotional   Chaplain responded to request for support and encouragement. Chaplain offered ministry of presence and prayer. Chaplains remain available for support as needs arise.  Chaplain will pass on to other Chaplains that Jesse Murphy enjoys hearing the Psalms and be sure we have a copy with [place] to read to him when we visit.   Chaplain Resident, Evelene Croon, M Div [phone] On-call pager

## 2019-04-08 NOTE — Progress Notes (Addendum)
12 Days Post-Op Procedure(s) (LRB): TRACHEOSTOMY placed using Shiley 8DCT Cuffed. (N/A) Placement Of Impella Left Ventricular Assist Device using ABIOMED Impella 5.5 with SmartAssist Device. (Right) Subjective: Alert, denies pain  Objective: Vital signs in last 24 hours: Temp:  [98 F (36.7 C)-100.8 F (38.2 C)] 98.7 F (37.1 C) (02/21 0400) Pulse Rate:  [54-104] 99 (02/21 0825) Cardiac Rhythm: Normal sinus rhythm (02/21 0800) Resp:  [17-35] 22 (02/21 0830) BP: (52-139)/(39-101) 121/58 (02/21 0830) SpO2:  [82 %-100 %] 100 % (02/21 0825) Arterial Line BP: (92-119)/(42-53) 108/46 (02/20 1400) FiO2 (%):  [40 %] 40 % (02/21 0825)  Hemodynamic parameters for last 24 hours: CVP:  [0 mmHg-22 mmHg] 7 mmHg  Intake/Output from previous day: 02/20 0701 - 02/21 0700 In: 12255.3 [I.V.:976.3; Blood:315; NG/GT:1250; IV Piggyback:288.1] Out: 10216 [Urine:7410; Emesis/NG output:100] Intake/Output this shift: Total I/O In: 144.2 [I.V.:38.7; Other:13.2; NG/GT:50; IV Piggyback:42.3] Out: 449 [Urine:400; Other:49]  General appearance: alert, cooperative and no distress Neurologic: intact Heart: regular rate and rhythm Lungs: rhonchi bilaterally Abdomen: normal findings: soft, non-tender  Lab Results: Recent Labs    04/07/19 0430 04/08/19 0323  WBC 12.3* 11.5*  HGB 7.1* 7.8*  HCT 22.5* 24.8*  PLT 71* 84*   BMET:  Recent Labs    04/07/19 1407 04/08/19 0323  NA 135 132*  K 4.7 4.4  CL 101 101  CO2 25 24  GLUCOSE 132* 198*  BUN 36* 36*  CREATININE 1.85* 1.79*  CALCIUM 7.8* 7.2*    PT/INR: No results for input(s): LABPROT, INR in the last 72 hours. ABG    Component Value Date/Time   PHART 7.488 (H) 04/07/2019 0405   HCO3 25.9 04/07/2019 0405   TCO2 27 04/07/2019 0405   ACIDBASEDEF 1.0 04/05/2019 0540   O2SAT 71.2 04/08/2019 0323   CBG (last 3)  Recent Labs    04/07/19 2342 04/08/19 0330 04/08/19 0800  GLUCAP 129* 192* 164*    Assessment/Plan: S/P Procedure(s)  (LRB): TRACHEOSTOMY placed using [place] 8DCT Cuffed. (N/A) Placement Of Impella Left Ventricular Assist Device using ABIOMED Impella 5.5 with SmartAssist Device. (Right) remains critically ill  CV- Impella at p5, co-ox 71  Slight increase in levophed overnight RESP- VDRF- vent per CCM RENAL- on CVVHD trying to pull 50 ml/hr, net + yesterday ENDO- CBG modestly elevated GI- on TF at goal NEURO- intact ID- on meropenem   LOS: 25 days    Melrose Nakayama 04/08/2019

## 2019-04-09 DIAGNOSIS — Z515 Encounter for palliative care: Secondary | ICD-10-CM

## 2019-04-09 LAB — CBC
HCT: 22.5 % — ABNORMAL LOW (ref 39.0–52.0)
HCT: 25.1 % — ABNORMAL LOW (ref 39.0–52.0)
Hemoglobin: 7 g/dL — ABNORMAL LOW (ref 13.0–17.0)
Hemoglobin: 8 g/dL — ABNORMAL LOW (ref 13.0–17.0)
MCH: 29.4 pg (ref 26.0–34.0)
MCH: 29.6 pg (ref 26.0–34.0)
MCHC: 31.1 g/dL (ref 30.0–36.0)
MCHC: 31.9 g/dL (ref 30.0–36.0)
MCV: 94.5 fL (ref 80.0–100.0)
MCV: 93 fL (ref 80.0–100.0)
Platelets: 111 10*3/uL — ABNORMAL LOW (ref 150–400)
Platelets: 113 10*3/uL — ABNORMAL LOW (ref 150–400)
RBC: 2.38 MIL/uL — ABNORMAL LOW (ref 4.22–5.81)
RBC: 2.7 MIL/uL — ABNORMAL LOW (ref 4.22–5.81)
RDW: 17.3 % — ABNORMAL HIGH (ref 11.5–15.5)
RDW: 16.7 % — ABNORMAL HIGH (ref 11.5–15.5)
WBC: 10 10*3/uL (ref 4.0–10.5)
WBC: 10.8 10*3/uL — ABNORMAL HIGH (ref 4.0–10.5)
nRBC: 0 % (ref 0.0–0.2)
nRBC: 0 % (ref 0.0–0.2)

## 2019-04-09 LAB — RENAL FUNCTION PANEL
Albumin: 2.4 g/dL — ABNORMAL LOW (ref 3.5–5.0)
Albumin: 2.2 g/dL — ABNORMAL LOW (ref 3.5–5.0)
Anion gap: 10 (ref 5–15)
Anion gap: 9 (ref 5–15)
BUN: 36 mg/dL — ABNORMAL HIGH (ref 8–23)
BUN: 34 mg/dL — ABNORMAL HIGH (ref 8–23)
CO2: 25 mmol/L (ref 22–32)
CO2: 25 mmol/L (ref 22–32)
Calcium: 7.6 mg/dL — ABNORMAL LOW (ref 8.9–10.3)
Calcium: 7.4 mg/dL — ABNORMAL LOW (ref 8.9–10.3)
Chloride: 100 mmol/L (ref 98–111)
Chloride: 104 mmol/L (ref 98–111)
Creatinine, Ser: 1.75 mg/dL — ABNORMAL HIGH (ref 0.61–1.24)
Creatinine, Ser: 1.77 mg/dL — ABNORMAL HIGH (ref 0.61–1.24)
GFR calc Af Amer: 39 mL/min — ABNORMAL LOW (ref >60)
GFR calc Af Amer: 39 mL/min — ABNORMAL LOW (ref >60)
GFR calc non Af Amer: 34 mL/min — ABNORMAL LOW (ref >60)
GFR calc non Af Amer: 34 mL/min — ABNORMAL LOW (ref >60)
Glucose, Bld: 172 mg/dL — ABNORMAL HIGH (ref 70–99)
Glucose, Bld: 153 mg/dL — ABNORMAL HIGH (ref 70–99)
Phosphorus: 2.6 mg/dL (ref 2.5–4.6)
Phosphorus: 3.2 mg/dL (ref 2.5–4.6)
Potassium: 4.7 mmol/L (ref 3.5–5.1)
Potassium: 4.8 mmol/L (ref 3.5–5.1)
Sodium: 135 mmol/L (ref 135–145)
Sodium: 138 mmol/L (ref 135–145)

## 2019-04-09 LAB — CULTURE, RESPIRATORY W GRAM STAIN

## 2019-04-09 LAB — PREPARE RBC (CROSSMATCH)

## 2019-04-09 LAB — GLUCOSE, CAPILLARY
Glucose-Capillary: 148 mg/dL — ABNORMAL HIGH (ref 70–99)
Glucose-Capillary: 131 mg/dL — ABNORMAL HIGH (ref 70–99)
Glucose-Capillary: 165 mg/dL — ABNORMAL HIGH (ref 70–99)
Glucose-Capillary: 144 mg/dL — ABNORMAL HIGH (ref 70–99)
Glucose-Capillary: 156 mg/dL — ABNORMAL HIGH (ref 70–99)

## 2019-04-09 LAB — COOXEMETRY PANEL
Carboxyhemoglobin: 1.6 % — ABNORMAL HIGH (ref 0.5–1.5)
Methemoglobin: 0.8 % (ref 0.0–1.5)
O2 Saturation: 66 %
Total hemoglobin: 6.8 g/dL — CL (ref 12.0–16.0)

## 2019-04-09 LAB — LACTATE DEHYDROGENASE: LDH: 276 U/L — ABNORMAL HIGH (ref 98–192)

## 2019-04-09 LAB — MAGNESIUM: Magnesium: 2.4 mg/dL (ref 1.7–2.4)

## 2019-04-09 MED ORDER — AMIODARONE IV BOLUS ONLY 150 MG/100ML
150.0000 mg | Freq: Once | INTRAVENOUS | Status: DC
  Administered 2019-04-09: 150 mg via INTRAVENOUS

## 2019-04-09 MED ORDER — AMIODARONE LOAD VIA INFUSION
150.0000 mg | Freq: Once | INTRAVENOUS | Status: DC
  Filled 2019-04-09: qty 83.34

## 2019-04-09 MED ORDER — SODIUM CHLORIDE 0.9% IV SOLUTION
Freq: Once | INTRAVENOUS | Status: AC
  Administered 2019-04-09: 50 mL via INTRAVENOUS

## 2019-04-09 MED ORDER — AMIODARONE LOAD VIA INFUSION
150.0000 mg | Freq: Once | INTRAVENOUS | Status: AC
  Administered 2019-04-09: 150 mg via INTRAVENOUS
  Filled 2019-04-09: qty 83.34

## 2019-04-09 MED ORDER — CALCIUM GLUCONATE-NACL 1-0.675 GM/50ML-% IV SOLN
1.0000 g | Freq: Once | INTRAVENOUS | Status: AC
  Administered 2019-04-09: 1000 mg via INTRAVENOUS
  Filled 2019-04-09: qty 50

## 2019-04-09 MED ORDER — AMIODARONE IV BOLUS ONLY 150 MG/100ML: 150.0000 mg | Freq: Once | INTRAVENOUS | Status: DC

## 2019-04-09 NOTE — Progress Notes (Signed)
NAME:  Jesse Murphy, MRN:  [id number], DOB:  05-Dec-1928, LOS: 43 ADMISSION DATE:  03/13/2019, CONSULTATION DATE:  2/2 REFERRING MD:  Nils Pyle, CHIEF COMPLAINT:  Dyspnea   Brief History   86 y/o male admitted 1/26, underwent CABG and L atrial appendage clipping on 1/29, moved to ICU on 2/1 for intubation.  Past Medical History  A fib, CAD, Prostate cancer, HTN  Significant Hospital Events   1/29 CABG 2/01 to ICU 2/05 Extubated 2/08 Reintubated 2/09 tracheostomy 2/09 Impella, PA catheter placed 2/11 CRRT initiated 2/16 leukocytosis, add vancomycin 2/17 start TC trials 2/19 urology placed 3 way foley, bleeding at rectal tube site 2/21 ATC initiated   Consults:  Nephrology CHF team Urology  Procedures:  Rt PICC 2/02 >> Trach 2/09 >> PA catheter 2/09 >> Lt [place] HD cath 2/11 >> L Rad [place] 2/3 >>  Impella 2/10 >>  Significant Diagnostic Tests:  Echo 1/26 >> EF 30 to 35%  Micro Data:  SARS CoV2 PCR 1/26 >> negative Influenza PCR 1/26 >> negative Sputum 2/01 >> oral flora Sputum 2/08 >> Pseudomonas >> pan sensitive  Blood 2/16 >> negative Sputum 2/16 >> Pseudomonas sens to cipro, gent, imepenem BCx2 2/20 >>   Antimicrobials:  Rocephin 2/01 >> 2/07 Cefepime 2/08 >> 2/10 Fortaz 2/10 >> 2/16 Vancomycin 2/16 >> 2/19 Meropenem 2/20 >>  Interim history/subjective:  Afebrile.  Glucose 130-172  ATC 40% I/O documentation (note 28L UOP documented, 6.8L) incorrect with irrigation foley Remains on CVVHD SvO2 66% RN reports no acute events.  States patient has indicated he does not want to continue care.   Objective   Blood pressure 100/71, pulse 92, temperature 98.2 F (36.8 C), temperature source Oral, resp. rate (!) 22, height 5\' 8"  (1.727 m), weight 87.7 kg, SpO2 94 %. CVP:  [4 mmHg-13 mmHg] 9 mmHg  Vent Mode: Stand-by FiO2 (%):  [40 %] 40 %   Intake/Output Summary (Last 24 hours) at 04/09/2019 0263 Last data filed at 04/09/2019 0800 Gross per 24 hour  Intake  6645.61 ml  Output 9857 ml  Net -3211.39 ml   Filed Weights   04/05/19 0450 04/06/19 1400  Weight: 92 kg 87.7 kg    Examination: General: critically ill appearing elderly male lying in bed in NAD  HEENT: MM pink/moist, trach midline c/d/i, on ATC Neuro: Awake, alert, follows commands, generalized weakness but moves all extremities  CV: s1s2 RRR, implella hum noted, unable to appreciate heart tones, right chest impella insertion site c/d/i PULM:  Non-labored on ATC, lungs bilaterally clear  GI: soft, bsx4 active  Extremities: warm/dry, no edema  Skin: no rashes or lesions  Resolved Hospital Problem list     Assessment & Plan:   Acute Hypoxemic Respiratory Failure due to Acute pulmonary edema complicated by Pseudomonas HCAP. -open ended ATC as tolerated  -if fatigue, rest on PRVC/PSV intermittently  -follow intermittent CXR  -D3/7 abx   Cardiogenic Shock with acute systolic heart failure. New onset atrial fibrillation >> back in sinus rhythm. -impella, vasopressors per TCTS, CHF service  -continue ASA, lipitor, midodrine, amiodarone  -heparin gtt on hold in setting of hematuria, rectal bleeding (suspcet hemorrhoids / flexi seal related) -1 unit PRBC 2/22  AKI secondary to Cardiogenic Shock. -CVVHD per Nephrology  -Trend BMP / urinary output -Replace electrolytes as indicated  Anemia of critical illness. -1 unit PRBC 2/22  -trend CBC -transfuse for Hgb <7% or significant bleeding   Rectal bleeding Noted 2/19 Suspect this is related to irritation  from flexi-seal.  No further bleeding since removal  -follow clinically  Hematuria with retained clots -continuous bladder irrigation per Urology   DM2 with hyperglycemia, poorly controlled -SSI -TF coverage 5 units Q4 -lantus 26 units QD    Acute metabolic encephalopathy due to shock, renal failure, hypoxemia -RASS Goal 0  -delirium prevention measures  -promote sleep / wake cycle   Pressure wounds / Reported as  Skin Tear 2/15 -WOC following   Leukocytosis  -abx as above   Best practice:  Diet: TF DVT prophylaxis: SCD's GI prophylaxis: PPI Mobility: bed rest Code Status: Full Code  Disposition: ICU  Labs    CMP Latest Ref Rng & Units 04/09/2019 04/08/2019 04/08/2019  Glucose 70 - 99 mg/dL 172(H) 170(H) 198(H)  BUN 8 - 23 mg/dL 36(H) 35(H) 36(H)  Creatinine 0.61 - 1.24 mg/dL 1.75(H) 1.75(H) 1.79(H)  Sodium 135 - 145 mmol/L 135 136 132(L)  Potassium 3.5 - 5.1 mmol/L 4.7 4.8 4.4  Chloride 98 - 111 mmol/L 100 102 101  CO2 22 - 32 mmol/L 25 25 24   Calcium 8.9 - 10.3 mg/dL 7.6(L) 7.6(L) 7.2(L)  Total Protein 6.5 - 8.1 g/dL - - -  Total Bilirubin 0.3 - 1.2 mg/dL - - -  Alkaline Phos 38 - 126 U/L - - -  AST 15 - 41 U/L - - -  ALT 0 - 44 U/L - - -    CBC Latest Ref Rng & Units 04/09/2019 04/08/2019 04/07/2019  WBC 4.0 - 10.5 K/uL 10.0 11.5(H) 12.3(H)  Hemoglobin 13.0 - 17.0 g/dL 7.0(L) 7.8(L) 7.1(L)  Hematocrit 39.0 - 52.0 % 22.5(L) 24.8(L) 22.5(L)  Platelets 150 - 400 K/uL 111(L) 84(L) 71(L)    ABG    Component Value Date/Time   PHART 7.488 (H) 04/07/2019 0405   PCO2ART 34.4 04/07/2019 0405   PO2ART 101.0 04/07/2019 0405   HCO3 25.9 04/07/2019 0405   TCO2 27 04/07/2019 0405   ACIDBASEDEF 1.0 04/05/2019 0540   O2SAT 66.0 04/09/2019 0338    CBG (last 3)  Recent Labs    04/08/19 2341 04/09/19 0345 04/09/19 0747  GLUCAP 169* 148* 131*    CC Time: 30 minutes   Noe Gens, MSN, NP-C [facility] 04/09/2019, 8:36 AM   Please see [email or web address] for pager details.

## 2019-04-09 NOTE — Progress Notes (Signed)
Pt refused morning ABG. RT will continue to monitor.

## 2019-04-09 NOTE — Progress Notes (Signed)
PT Cancellation Note  Patient Details Name: Jesse Murphy MRN: [id number] DOB: 1928/04/07   Cancelled Treatment:    Reason Eval/Treat Not Completed: Other (comment) Family meeting scheduled today to discuss goals of care. Will follow.   Jesse Murphy 04/09/2019, 8:00 AM Marisa Severin, PT, DPT Acute Rehabilitation Services Pager [phone] Office [phone]

## 2019-04-09 NOTE — Progress Notes (Signed)
[facility] for Anticoagulation management/assistance  Indication: Impella 5.5  No Known Allergies  Patient Measurements: Height: 5\' 8"  (172.7 cm) Weight: (Unable due to bed error) IBW/kg (Calculated) : 68.4 Heparin Dosing Weight: 86kg  Vital Signs: Temp: 98.2 F (36.8 C) (02/22 1000) Temp Source: Oral (02/22 1000) BP: 98/56 (02/22 1000) Pulse Rate: 74 (02/22 1000)  Labs: Recent Labs    04/07/19 0430 04/07/19 0431 04/08/19 0323 04/08/19 1543 04/09/19 0338  HGB 7.1*   < > 7.8*  --  7.0*  HCT 22.5*  --  24.8*  --  22.5*  PLT 71*  --  84*  --  111*  CREATININE  --    < > 1.79* 1.75* 1.75*   < > = values in this interval not displayed.    Estimated Creatinine Clearance: 31.4 mL/min (A) (by C-G formula based on SCr of 1.75 mg/dL (H)).  Assessment: 86 year old male s/p CABG 1/29 with MAZE and LAA. Patient taken to OR 2/9 for tracheostomy and impella 5.5 placement.   Purge (25 units/ml of heparin) is running at 13.3 ml/hr, which provides 332 units/hr of heparin.  Systemic heparin gtt was turned off recurrent hematuria and anemia. Receiving PRBC today given Hgb 7, plt increasing to 111. Urine still remains bloody - no new melena.  Goal of Therapy:  Monitor platelets by anticoagulation protocol: Yes   Plan:   Continue 1/2 strength heparin purge per MD No systemic heparin  Monitor CBC, s/sx bleeding  Antonietta Jewel, PharmD, BCCCP Clinical Pharmacist  Phone: [phone]  Please check AMION for all [facility] phone numbers After 10:00 PM, call [place] [phone] 04/09/2019

## 2019-04-09 NOTE — Progress Notes (Signed)
Patient ID: RIGGINS CISEK, male   DOB: November 08, 1928, 86 y.o.   MRN: [id number] TCTS Evening Rounds:  Remains on NE 12 mcg and Vaso 0.02 to support BP.  Impella on P4  On CRRT but not able to remove any volume.  Tolerating trach collar.  BMET    Component Value Date/Time   NA 138 04/09/2019 1505   K 4.8 04/09/2019 1505   CL 104 04/09/2019 1505   CO2 25 04/09/2019 1505   GLUCOSE 153 (H) 04/09/2019 1505   BUN 34 (H) 04/09/2019 1505   CREATININE 1.77 (H) 04/09/2019 1505   CALCIUM 7.4 (L) 04/09/2019 1505   GFRNONAA 34 (L) 04/09/2019 1505   GFRAA 39 (L) 04/09/2019 1505   CBC    Component Value Date/Time   WBC 10.8 (H) 04/09/2019 1400   RBC 2.70 (L) 04/09/2019 1400   HGB 8.0 (L) 04/09/2019 1400   HCT 25.1 (L) 04/09/2019 1400   PLT 113 (L) 04/09/2019 1400   MCV 93.0 04/09/2019 1400   MCH 29.6 04/09/2019 1400   MCHC 31.9 04/09/2019 1400   RDW 16.7 (H) 04/09/2019 1400   LYMPHSABS 0.5 (L) 04/06/2019 1233   MONOABS 1.9 (H) 04/06/2019 1233   EOSABS 0.2 04/06/2019 1233   BASOSABS 0.0 04/06/2019 1233    He is a limited code.

## 2019-04-09 NOTE — Plan of Care (Signed)
  Problem: Clinical Measurements: Goal: Ability to maintain clinical measurements within normal limits will improve Outcome: Progressing Goal: Respiratory complications will improve Outcome: Progressing Goal: Cardiovascular complication will be avoided Outcome: Progressing   Problem: Nutrition: Goal: Adequate nutrition will be maintained Outcome: Progressing   Problem: Coping: Goal: Level of anxiety will decrease Outcome: Progressing   Problem: Elimination: Goal: Will not experience complications related to bowel motility Outcome: Progressing   Problem: Pain Managment: Goal: General experience of comfort will improve Outcome: Progressing   Problem: Safety: Goal: Ability to remain free from injury will improve Outcome: Progressing   Problem: Skin Integrity: Goal: Risk for impaired skin integrity will decrease Outcome: Progressing   Problem: Role Relationship: Goal: Ability to communicate will improve Outcome: Progressing

## 2019-04-09 NOTE — Progress Notes (Addendum)
13 Days Post-Op Procedure(s) (LRB): TRACHEOSTOMY placed using Shiley 8DCT Cuffed. (N/A) Placement Of Impella Left Ventricular Assist Device using ABIOMED Impella 5.5 with SmartAssist Device. (Right) Subjective: Patient improving - now off vent on trach collar Impella weaning with stable cardiac output euvolemic on CVVH- may transition to HD soon Blood loss anemia- decreasing heparin exposure  WBC normal off antibiotics Back in afib- will treat anemia and bolus amiodarone  Objective: Vital signs in last 24 hours: Temp:  [97.9 F (36.6 C)-98.2 F (36.8 C)] 98.2 F (36.8 C) (02/22 0755) Pulse Rate:  [34-106] 92 (02/22 0815) Cardiac Rhythm: Normal sinus rhythm (02/21 2000) Resp:  [13-33] 22 (02/22 0815) BP: (46-122)/(20-82) 100/71 (02/22 0815) SpO2:  [89 %-100 %] 94 % (02/22 0815) FiO2 (%):  [40 %] 40 % (02/22 0724)  Hemodynamic parameters for last 24 hours: CVP:  [4 mmHg-13 mmHg] 9 mmHg  Intake/Output from previous day: 02/21 0701 - 02/22 0700 In: 9597.2 [I.V.:969; NG/GT:1640; IV Piggyback:695.5] Out: 6433 [Urine:6870; Emesis/NG output:1025] Intake/Output this shift: Total I/O In: 312.6 [I.V.:39.2; Other:23.3; NG/GT:170; IV Piggyback:80.1] Out: 349 [Urine:250; Other:99]       Exam    General- alert and comfortable. Incisions clean    Neck- no JVD, no cervical adenopathy palpable, no carotid bruit   Lungs- clear without rales, wheezes   Cor- regular rate and rhythm, no murmur , gallop   Abdomen- soft, non-tender   Extremities - warm, non-tender, minimal edema   Neuro- oriented, appropriate, no focal weakness   Lab Results: Recent Labs    04/08/19 0323 04/09/19 0338  WBC 11.5* 10.0  HGB 7.8* 7.0*  HCT 24.8* 22.5*  PLT 84* 111*   BMET:  Recent Labs    04/08/19 1543 04/09/19 0338  NA 136 135  K 4.8 4.7  CL 102 100  CO2 25 25  GLUCOSE 170* 172*  BUN 35* 36*  CREATININE 1.75* 1.75*  CALCIUM 7.6* 7.6*    PT/INR: No results for input(s): LABPROT, INR in  the last 72 hours. ABG    Component Value Date/Time   PHART 7.488 (H) 04/07/2019 0405   HCO3 25.9 04/07/2019 0405   TCO2 27 04/07/2019 0405   ACIDBASEDEF 1.0 04/05/2019 0540   O2SAT 66.0 04/09/2019 0338   CBG (last 3)  Recent Labs    04/08/19 2341 04/09/19 0345 04/09/19 0747  GLUCAP 169* 148* 131*    Assessment/Plan: S/P Procedure(s) (LRB): TRACHEOSTOMY placed using Shiley 8DCT Cuffed. (N/A) Placement Of Impella Left Ventricular Assist Device using ABIOMED Impella 5.5 with SmartAssist Device. (Right) Cont gradual wean of support Maintain Hb > 8.0   LOS: 26 days    Tharon Aquas Trigt III 04/09/2019

## 2019-04-09 NOTE — Progress Notes (Addendum)
Nephrology Progress Note:  Patient ID: Jesse Murphy, male   DOB: 07/29/1928, 86 y.o.   MRN: [id number] S:  Family has a meeting at bedside today and palliative had consulted.  He is charted as having 6.2 liters urine over 2/21 however setting of bladder irrigation.  Had 1.9 liters UF with CRRT charted over 2/21.  UF goal of net even currently.  Has been on levo at 14 mcg/min. Has been on trach collar.  He has gotten blood before this admission and nods that he is willing to accept a unit PRBC's  Review of systems:  Denies air hunger  Denies n/v Unable to quantify urine - bladder irrigation   O:BP (!) 105/50   Pulse 79   Temp 98.1 F (36.7 C) (Oral)   Resp (!) 23   Ht 5' 8" (1.727 m)   Wt 87.7 kg   SpO2 100%   BMI 29.40 kg/m   Intake/Output Summary (Last 24 hours) at 04/09/2019 0552 Last data filed at 04/09/2019 0500 Gross per 24 hour  Intake 9466.84 ml  Output 9924 ml  Net -457.16 ml   Intake/Output: I/O last 3 completed shifts: In: 17123.4 [I.V.:1451.8; Blood:315; Other:12586.6; NG/GT:2120; IV Piggyback:650] Out: 14208 [Urine:10500; Emesis/NG output:100; Other:3608]  Intake/Output this shift:  Total I/O In: 4311.8 [I.V.:412.7; Other:3119.1; NG/GT:670; IV Piggyback:110.1] Out: 5211 [Urine:3150; Emesis/NG output:1025; Other:1036] Weight change:    Physical exam:  Gen: elderly male in bed chronically ill-appearing HEENT trach in place; off vent  CVS: S1 S2 no rub Resp: cta; unlabored at rest  Abd: soft/ NT/ND  Ext: 1+ lower extremity edema Neuro - awake and interactive and follows commands, nods Psych - no anxiety or agitation  Access: left chest catheter   Recent Labs  Lab 04/03/19 0548 04/03/19 1600 04/04/19 0355 04/04/19 0418 04/05/19 1622 04/05/19 1622 04/06/19 0404 04/06/19 0404 04/06/19 0424 04/07/19 0405 04/07/19 0431 04/07/19 1407 04/08/19 0323 04/08/19 1543 04/09/19 0338  NA 133*   < > 133*   < > 135   < > 133*   < > 131* 134* 135 135 132* 136  135  K 4.2   < > 4.3   < > 4.6   < > 4.7   < > 4.5 4.5 4.9 4.7 4.4 4.8 4.7  CL 97*   < > 98   < > 101  --  98  --   --   --  100 101 101 102 100  CO2 25   < > 22   < > 24  --  24  --   --   --  23 25 24 25 25  GLUCOSE 253*   < > 196*   < > 134*  --  167*  --   --   --  154* 132* 198* 170* 172*  BUN 40*   < > 38*   < > 35*  --  33*  --   --   --  37* 36* 36* 35* 36*  CREATININE 1.78*   < > 1.77*   < > 1.73*  --  1.77*  --   --   --  2.06* 1.85* 1.79* 1.75* 1.75*  ALBUMIN 1.9*   < > 2.2*   < > 2.3*  --  2.3*  --   --   --  2.5* 2.6* 2.3* 2.3* 2.4*  CALCIUM 7.6*   < > 7.5*   < > 7.7*  --  7.7*  --   --   --    7.4* 7.8* 7.2* 7.6* 7.6*  PHOS  --    < >  --    < > 3.0  --  2.5  --   --   --  2.4* 2.3* 2.5 2.6 2.6  AST 33  --  39  --   --   --   --   --   --   --   --   --   --   --   --   ALT 24  --  25  --   --   --   --   --   --   --   --   --   --   --   --    < > = values in this interval not displayed.   Liver Function Tests: Recent Labs  Lab 04/03/19 0548 04/03/19 1600 04/04/19 0355 04/04/19 1457 04/08/19 0323 04/08/19 1543 04/09/19 0338  AST 33  --  39  --   --   --   --   ALT 24  --  25  --   --   --   --   ALKPHOS 76  --  73  --   --   --   --   BILITOT 0.9  --  0.9  --   --   --   --   PROT 5.3*  --  5.5*  --   --   --   --   ALBUMIN 1.9*   < > 2.2*   < > 2.3* 2.3* 2.4*   < > = values in this interval not displayed.    CBC: Recent Labs  Lab 04/06/19 0404 04/06/19 0424 04/06/19 1233 04/07/19 0405 04/07/19 0430 04/08/19 0323 04/09/19 0338  WBC 17.5*   < > 16.6*  --  12.3* 11.5* 10.0  NEUTROABS  --   --  13.7*  --   --   --   --   HGB 9.0*   < > 8.4*   < > 7.1* 7.8* 7.0*  HCT 28.0*   < > 26.0*   < > 22.5* 24.8* 22.5*  MCV 91.2  --  91.5  --  91.8 92.9 94.5  PLT 83*   < > 74*  --  71* 84* 111*   < > = values in this interval not displayed.   Cardiac Enzymes: No results for input(s): CKTOTAL, CKMB, CKMBINDEX, TROPONINI in the last 168 hours. CBG: Recent Labs  Lab  04/08/19 1149 04/08/19 1550 04/08/19 1936 04/08/19 2341 04/09/19 0345  GLUCAP 158* 177* 156* 169* 148*   IStudies/Results: DG Chest Port 1 View  Result Date: 04/08/2019 CLINICAL DATA:  CABG EXAM: PORTABLE CHEST 1 VIEW COMPARISON:  Yesterday FINDINGS: Impella device from the right with stable positioning. Bilateral central line with tips at the SVC. CABG and left atrial clipping. Haziness of the left more than right chest likely from atelectasis and pleural fluid. No visible pneumothorax. The tracheostomy tube is in good position. IMPRESSION: 1. Stable hardware. 2. Stable mild hazy opacity likely reflecting dependent atelectasis and pleural fluid. Electronically Signed   By: Jonathon  Watts M.D.   On: 04/08/2019 06:11   DG Chest Port 1 View  Result Date: 04/07/2019 CLINICAL DATA:  Post CABG EXAM: PORTABLE CHEST 1 VIEW COMPARISON:  Chest radiograph from one day prior. FINDINGS: Tracheostomy tube tip overlies the tracheal air column at the level of the thoracic inlet. Enteric tube enters stomach with the tip not seen   on this image. Right axillary approach Impella device is stable with tip overlying the left heart. Left subclavian central venous catheter terminates in the middle third of the SVC. Stable cardiomediastinal silhouette with top-normal heart size. No pneumothorax. Small bilateral pleural effusions are unchanged. No overt pulmonary edema. Left lung base atelectasis is unchanged. IMPRESSION: 1. No pneumothorax.  Support structures as detailed. 2. Stable small bilateral pleural effusions and left lung base atelectasis. Electronically Signed   By: Ilona Sorrel M.D.   On: 04/07/2019 06:31   . aspirin  81 mg Per Tube Daily  . atorvastatin  80 mg Per Tube q1800  . B-complex with vitamin C  1 tablet Oral Daily  . chlorhexidine gluconate (MEDLINE KIT)  15 mL Mouth Rinse BID  . Chlorhexidine Gluconate Cloth  6 each Topical Daily  . feeding supplement (PRO-STAT SUGAR FREE 64)  60 mL Per Tube BID   . Gerhardt's butt cream   Topical BID  . hydrocortisone   Rectal BID  . insulin aspart  0-24 Units Subcutaneous Q4H  . insulin aspart  5 Units Subcutaneous Q4H  . insulin glargine  26 Units Subcutaneous Daily  . mouth rinse  15 mL Mouth Rinse 10 times per day  . midodrine  10 mg Oral TID WC  . pantoprazole (PROTONIX) IV  40 mg Intravenous Q24H  . sodium chloride flush  10-40 mL Intracatheter Q12H  . tamsulosin  0.4 mg Oral Daily    BMET    Component Value Date/Time   NA 135 04/09/2019 0338   K 4.7 04/09/2019 0338   CL 100 04/09/2019 0338   CO2 25 04/09/2019 0338   GLUCOSE 172 (H) 04/09/2019 0338   BUN 36 (H) 04/09/2019 0338   CREATININE 1.75 (H) 04/09/2019 0338   CALCIUM 7.6 (L) 04/09/2019 0338   GFRNONAA 34 (L) 04/09/2019 0338   GFRAA 39 (L) 04/09/2019 0338   CBC    Component Value Date/Time   WBC 10.0 04/09/2019 0338   RBC 2.38 (L) 04/09/2019 0338   HGB 7.0 (L) 04/09/2019 0338   HCT 22.5 (L) 04/09/2019 0338   PLT 111 (L) 04/09/2019 0338   MCV 94.5 04/09/2019 0338   MCH 29.4 04/09/2019 0338   MCHC 31.1 04/09/2019 0338   RDW 17.3 (H) 04/09/2019 0338   LYMPHSABS 0.5 (L) 04/06/2019 1233   MONOABS 1.9 (H) 04/06/2019 1233   EOSABS 0.2 04/06/2019 1233   BASOSABS 0.0 04/06/2019 1233    Assessment/Plan:  1. AKI(Cr was 0.7 prior to surgery)- due to postoperative CHF/cardiogenic shock that was refractory to diuretics and started on CRRT on 03/29/19 for volume removal.   1. Keep even to net neg 50 an hour as tolerated 2. Discussed case with Dr. Haroldine Laws and plan is to get CVP to 5-8 while not having to increase pressors in the hopes of weaning impella.  3. Offering CRRT as long as aligns with goals of care and as long as able to tolerate; currently though note goals of care discussions ongoing  2. Acute systolic CHF/cardiogenic shock- EF 25-30%; s/p Impella. Continue with CVVHD and UF as tolerated. 3. CAD s/p CABG on 0/93/26 complicated by cardiogenic shock and  ARF. 4. Acute hypoxic respiratory failure with pseudomonas pneumonia- s/p trach on 03/27/19. abx per PCCM 5. PAF- s/p MAZE and LAA occlusion- therapy per CHF team 6. Hematuria- bladder irrigation 7. ABLA- Hx Melena. Transfuse as needed.  PRBC's today to optimize 8. Protein malnutrition- moderate to severe 9. Hypophosphatemia- replete as needed 10. Vascular  access- left [place] HD catheter placed 03/29/19 11. Disposition- poor overall prognosis. He is not a longterm dialysis candidate given his cardiogenic shock and trach.    C , MD 04/09/2019 6:15 AM   

## 2019-04-09 NOTE — Progress Notes (Addendum)
Patient ID: Jesse Murphy, male   DOB: 01-Dec-1928, 86 y.o.   MRN: [id number]    Advanced Heart Failure Rounding Note  PCP-Cardiologist: Kate Sable, MD   Subjective:    Remains on impella 5.5 support, P-5, Flow 3.8. Co-ox 66 % (hgb 7.0)  On vent through trach. Respiratory culture with pseudomonas. Treated w/ Tressie Ellis and later changed to vancomycin. Started meropenem with fever on 2/20. Afebrile now. WBC normal at 10.   Started CVVHD 2/11. UF limited by low MAPs, intermittently able to pull up to 50 cc/hr.  Today, CVP 7.   Unable to accurately track UOP. He is requiring continuous bladder irrigation for hematuria/ clot retention. Systemic Heparin off. Continues w/heparin in impella purge. Urology following. Hgb 7.0 today. Ordered to get 1 unit of blood.   Remains on vasopressin 0.03 units + NE 12 mcg.  MAPs upper 60s-low70s.  He remains on amiodarone 30.   Objective:   Weight Range: 87.7 kg Body mass index is 29.4 kg/m.   Vital Signs:   Temp:  [97.9 F (36.6 C)-98.2 F (36.8 C)] 98.2 F (36.8 C) (02/22 1000) Pulse Rate:  [34-106] 74 (02/22 1000) Resp:  [13-33] 20 (02/22 1000) BP: (46-122)/(20-82) 98/56 (02/22 1000) SpO2:  [89 %-100 %] 100 % (02/22 1000) FiO2 (%):  [40 %] 40 % (02/22 0724) Last BM Date: 04/08/19  Weight change: Filed Weights   04/05/19 0450 04/06/19 1400  Weight: 92 kg 87.7 kg    Intake/Output:   Intake/Output Summary (Last 24 hours) at 04/09/2019 1011 Last data filed at 04/09/2019 1000 Gross per 24 hour  Intake 7031.69 ml  Output 9737 ml  Net -2705.31 ml      Physical Exam   PHYSICAL EXAM: CVP 7 General:  Critically ill WM, on vent through TC, + bair hugger HEENT: + cor trak  Neck: supple. no JVD. + LSC trialysis cath Carotids 2+ bilat; no bruits. No lymphadenopathy or thyromegaly appreciated. Cor: PMI nondisplaced. Regular rate & rhythm. No rubs, gallops or murmurs. Lungs: clear, on vent through trach  Abdomen: soft, nontender,  nondistended. No hepatosplenomegaly. No bruits or masses. Good bowel sounds. Extremities: no cyanosis, clubbing, rash, trace bilateral edema + SCDs Neuro: wake on trach, cranial nerves grossly intact. moves all 4 extremities w/o difficulty. Affect pleasant.   Telemetry   NSR in upper 70s (personally reviewed)  Labs    CBC Recent Labs    04/06/19 1233 04/07/19 0405 04/08/19 0323 04/09/19 0338  WBC 16.6*   < > 11.5* 10.0  NEUTROABS 13.7*  --   --   --   HGB 8.4*   < > 7.8* 7.0*  HCT 26.0*   < > 24.8* 22.5*  MCV 91.5   < > 92.9 94.5  PLT 74*   < > 84* 111*   < > = values in this interval not displayed.   Basic Metabolic Panel Recent Labs    04/08/19 0323 04/08/19 0323 04/08/19 1543 04/09/19 0338  NA 132*   < > 136 135  K 4.4   < > 4.8 4.7  CL 101   < > 102 100  CO2 24   < > 25 25  GLUCOSE 198*   < > 170* 172*  BUN 36*   < > 35* 36*  CREATININE 1.79*   < > 1.75* 1.75*  CALCIUM 7.2*   < > 7.6* 7.6*  MG 2.3  --   --  2.4  PHOS 2.5   < > 2.6  2.6   < > = values in this interval not displayed.   Liver Function Tests Recent Labs    04/08/19 1543 04/09/19 0338  ALBUMIN 2.3* 2.4*   No results for input(s): LIPASE, AMYLASE in the last 72 hours. Cardiac Enzymes No results for input(s): CKTOTAL, CKMB, CKMBINDEX, TROPONINI in the last 72 hours.  BNP: BNP (last 3 results) Recent Labs    03/22/19 0401 03/26/19 0626 03/27/19 1255  BNP 340.4* 687.5* 800.4*    ProBNP (last 3 results) No results for input(s): PROBNP in the last 8760 hours.   D-Dimer No results for input(s): DDIMER in the last 72 hours. Hemoglobin A1C No results for input(s): HGBA1C in the last 72 hours. Fasting Lipid Panel No results for input(s): CHOL, HDL, LDLCALC, TRIG, CHOLHDL, LDLDIRECT in the last 72 hours. Thyroid Function Tests No results for input(s): TSH, T4TOTAL, T3FREE, THYROIDAB in the last 72 hours.  Invalid input(s): FREET3  Other results:   Imaging    No results  found.   Medications:     Scheduled Medications: . aspirin  81 mg Per Tube Daily  . atorvastatin  80 mg Per Tube q1800  . B-complex with vitamin C  1 tablet Oral Daily  . chlorhexidine gluconate (MEDLINE KIT)  15 mL Mouth Rinse BID  . Chlorhexidine Gluconate Cloth  6 each Topical Daily  . feeding supplement (PRO-STAT SUGAR FREE 64)  60 mL Per Tube BID  . Gerhardt's butt cream   Topical BID  . hydrocortisone   Rectal BID  . insulin aspart  0-24 Units Subcutaneous Q4H  . insulin aspart  5 Units Subcutaneous Q4H  . insulin glargine  26 Units Subcutaneous Daily  . mouth rinse  15 mL Mouth Rinse 10 times per day  . midodrine  10 mg Oral TID WC  . pantoprazole (PROTONIX) IV  40 mg Intravenous Q24H  . sodium chloride flush  10-40 mL Intracatheter Q12H  . tamsulosin  0.4 mg Oral Daily    Infusions: .  prismasol BGK 4/2.5 400 mL (04/08/19 2334)  .  prismasol BGK 4/2.5 200 mL/hr at 04/08/19 1354  . sodium chloride    . sodium chloride Stopped (04/04/19 1736)  . sodium chloride 10 mL/hr at 03/29/19 2128  . amiodarone 30 mg/hr (04/09/19 1000)  . feeding supplement (VITAL 1.5 CAL) 50 mL/hr at 04/09/19 0700  . impella catheter heparin 25 unit/mL in dextrose 5%    . meropenem (MERREM) IV Stopped (04/09/19 0724)  . norepinephrine (LEVOPHED) Adult infusion 12 mcg/min (04/09/19 1000)  . prismasol BGK 4/2.5 1,500 mL/hr at 04/09/19 0943  . vasopressin (PITRESSIN) infusion - *FOR SHOCK* 0.03 Units/min (04/09/19 1000)    PRN Medications: sodium chloride, oxyCODONE **AND** acetaminophen, dextrose, fentaNYL (SUBLIMAZE) injection, heparin, midazolam, ondansetron (ZOFRAN) IV, sodium chloride flush, sodium chloride flush    Assessment/Plan   1. Acute systolic HF -> Cardiogenic shock - post-op echo on 03/20/19 EF 25-30% (pre-op 30-35%. In 12/2017 EF normal) - Remains on Impella support P-5 Waveforms ok LDH stable 276.  Co-ox 66% today (hgb 7). Will continue current level of support with soft BP.   - Continue VP 0.03  and NE 12 mcg. Have had to go up on norepinephrine. Likely vasodilated with CVVHD and Impella, may have been septic component  with fever 2/20, now afebrile. - Continue CVVHD for volume removal. CVP now down to 7, think we are at goal. Continue gentle UF to keep him net negative. Hard to pull fluid with soft MAPs.  -  Plan had been to wean Impella over the weekend with hopeful extraction today. Continues on P-5. Once Impella out then would try to wean inotropes and hope for renal recovery (suspect unlikely) or ability to tolerate iHD (suspect this would be difficult). If fails Impella or inotrope wean or unable to tolerate iHD would then move to comfort care. Patient is still requiring pressors and is ambivalent about ongoing aggressive care.  Discussed with Drs Marval Regal and Maryjean Ka, think that overall prognosis is quite poor here (unlikely to be able to thrive off RRT). Palliative care now following. Plan family meeting today to discuss [place]. Currently partial code. Ok w/ vent. No chest compression/defibrillation.   2. CAD s/p CABG this admit (LIMA->LAD, SVG->OM, SVG->RCA on 03/15/19) - No current s/s angina - Continue ASA/ticagrelor/statin  3. Acute hypoxic respiratory failure with Pseudomonas PNA - s/p trach on 03/27/19 - WBC 36>26>17>13>11.5>>10. No fever since 2/20. CXR 2/21 with left base infiltrate. - initially treated w/ Tressie Ellis and changed to Vanc. With recurrent fever was started on meropenem empirically 2/20.  Blood cultures NGTD. Trach aspirate + for abundant psuedomonas - CCM managing vent   4. PAF - s/p MAZE and LAA occlusion - In/out of AF. Remains in NSR  -continue IV amio 30 mg/hr.  - Off systemic heparin with hematuria   5. AKI  - due to shock/ATN - Creatinine peaked at 5.04 - Started [facility] 03/29/19 per Nephrology.  - CVP down to 7, will keep gentle UF for now, think volume is close to goal.  - With ongoing pressor requirement,  not optimistic about his  ability to come off RRT.   6. Hematuria - LDH ok. Off heparin drip.  - Getting continuous bladder irrigation.   - Urology following.   7. Melena - No melena overnight.   8. Anemia -Hematuria/melena -Hgb 7.6 -->10.2 ->8.9 >6.8 >8.3>7.8>9.0>9.5 > 7.1>7.8>7.0 - s/p transfusion x 1 unit 2/18 and 2/20. Getting another unit of blood today   Lyda Jester, PA-C  10:11 AM   Agree with above.  Very difficult situation.   Remains critically ill with MSOF. Remains on Impella at P-5 and on CVVHD. Trach in place and has been on TC since yesterday am. Also on NE 12 and VP 0.02. Overt GI bleeding has stopped but hgb continues to trend down. Continues with TBI for clots in bladder. In/out of AF on IV amio.   General:  Critically ill appearing. On TC  HEENT: normal Neck: supple. + trach Carotids 2+ bilat; no bruits. No lymphadenopathy or thryomegaly appreciated. Cor: PMI nondisplaced. Regular rate & rhythm. No rubs, gallops or murmurs. + impella site ok. + impella hum + HD cath Lungs: clear Abdomen: soft, nontender, nondistended. No hepatosplenomegaly. No bruits or masses. Good bowel sounds. Extremities: no cyanosis, clubbing, rash, 1+ edema Neuro: alert & orientedx3, cranial nerves grossly intact. moves all 4 extremities w/o difficulty. Affect pleasant  Long talk with him and his family. He clearly has made some progress over the past week but still has MSOF and has a long way to go and it is unclear to me whether or not there is a way out for him.   I talked to him and his family about the options at this point  1) Continue aggressive care with full code status 2) Continue aggressive care with DNR in setting of further decompensation 3) Switch to comfort care  Family seems most interested in continuing with comfort  Care though Mr. Weekly at times seems to favor  switching to comfort care though he has not voiced this definitively.   For now, we will continue aggressive therapy. I think  next step is to try to wean Impella. I turned down to P-4 today and watched and he was stable. I will d/w Dr. Prescott Gum and hopefully we can get Impella out tomorrow or Wednesday. If stable off Impella the plan would be to then try and wean CVVHD and see if his kidneys will recover or if he will tolerate iHD. We will then need to wean inotropes.   If fails along the way then can reconsider comfort care.   Given his age, fraility and MSOF, I would strongly recommend DNR status in case of rapid decompensation.   Impella parameters viewed personally.   CRITICAL CARE Performed by: Glori Bickers  Total critical care time: 55 minutes  Critical care time was exclusive of separately billable procedures and treating other patients.  Critical care was necessary to treat or prevent imminent or life-threatening deterioration.  Critical care was time spent personally by me (independent of midlevel providers or residents) on the following activities: development of treatment plan with patient and/or surrogate as well as nursing, discussions with consultants, evaluation of patient's response to treatment, examination of patient, obtaining history from patient or surrogate, ordering and performing treatments and interventions, ordering and review of laboratory studies, ordering and review of radiographic studies, pulse oximetry and re-evaluation of patient's condition.  Glori Bickers, MD  3:41 PM

## 2019-04-09 NOTE — Progress Notes (Signed)
Met with Mr. Jesse Murphy and his family at bedside. Wonderful man and a wonderful family. They requested prayer and Mr. Ringo requested that I read a psalm aloud. Will continue to be available for him and his family as they need.  [place].

## 2019-04-09 NOTE — Progress Notes (Signed)
Daily Progress Note   Patient Name: Jesse Murphy       Date: 04/09/2019 DOB: 1928/08/17  Age: 86 y.o. MRN#: [id number] Attending Physician: Ivin Poot, MD Primary Care Physician: Sandi Mealy, MD Admit Date: 03/13/2019  Reason for Consultation/Follow-up: Establishing goals of care and Psychosocial/spiritual support  Palliative initially suggested by Renal on 2/16 to establish goals and limitations of care.  Subjective: Spoke with Jesse Murphy at bedside.  He is alert and orientated but it is likely difficult for him to understand everything he has been thru.    He nods and shakes his head in answer to my questions.    I explained that quality of life is very different for each individual.  I am trying to determine what good quality of life would look like for him.   I asked if he is willing to live for weeks to months or maybe even long term in a nursing home.  He indicates that he is willing.   I asked if he is willing to remain on hemodialysis 3 - 4 times a weeks for 4 hours a day.  He nods.  I asked if he were to arrest and his heart stopped, would he want [place] to press on his chest, shock and attempt to resuscitate him.  He shook his head no.    I asked if he arrested would he be willing to let us put him on a ventilator.  He nodded yes.    I asked once again - "so if your heart stops and you stop breathing and God has called you home - you do not want [place] to press on your chest or shock you but you are ok with being on a ventilator machine?    He nodded his head yes.  I look forward to meeting with his family this afternoon.  My plan is to give them a condensed version of his hospital stay, explain that if he arrests he does not want CPR/defibrillation, and to talk with them about  anticipatory planning and options.   I believe his wife and daughter will be extremely happy to visit him.  Assessment: 86 yo male awake and alert on trach collar.  Remains on CVVHD.  Impella in place.  On pressors. He will receive another unit of blood  today. Hopefully bleeding will decrease as heparin exposure is decreased.   Patient Profile/HPI:   86 y.o. male  with past medical history of coronary artery disease status post PCI, prostate cancer, non-Hodgkin's lymphoma, diabetes mellitus, diastolic heart failure who was admitted on 03/13/2019 with afib RVR in the 200s.  The RVR was refractory to defibrillation and adenosine.  Work up revealed ACS (accerating angina) and worsening heart failure (EF 30 -35%).  He underwent CABG on 1/28.  He was able to be extubated but was re-intubated on 2/8 and required tracheostomy.  On 2/9 Impella device was inserted.  His post op course was difficult developing hypotension, worsening renal failure, GI bleeding and hematuria.  He is now on continuous renal replacement therapy.  Pressors have been initiated and boluses given to support his blood pressure on dialysis.  He has been diagnosed with a pseudomonas infection from tracheal aspirate.  Per Epic notes on multiple occassions the patient has seemed to indicate that he did not want a particular intervention or that he wanted to stop all aggressive care.  Then after speaking with his son he moves forward with the interventions.   Length of Stay: 26  Current Medications: Scheduled Meds:  . aspirin  81 mg Per Tube Daily  . atorvastatin  80 mg Per Tube q1800  . B-complex with vitamin C  1 tablet Oral Daily  . chlorhexidine gluconate (MEDLINE KIT)  15 mL Mouth Rinse BID  . Chlorhexidine Gluconate Cloth  6 each Topical Daily  . feeding supplement (PRO-STAT SUGAR FREE 64)  60 mL Per Tube BID  . Gerhardt's butt cream   Topical BID  . hydrocortisone   Rectal BID  . insulin aspart  0-24 Units Subcutaneous Q4H  .  insulin aspart  5 Units Subcutaneous Q4H  . insulin glargine  26 Units Subcutaneous Daily  . mouth rinse  15 mL Mouth Rinse 10 times per day  . midodrine  10 mg Oral TID WC  . pantoprazole (PROTONIX) IV  40 mg Intravenous Q24H  . sodium chloride flush  10-40 mL Intracatheter Q12H  . tamsulosin  0.4 mg Oral Daily    Continuous Infusions: .  prismasol BGK 4/2.5 400 mL (04/08/19 2334)  .  prismasol BGK 4/2.5 200 mL/hr at 04/08/19 1354  . sodium chloride    . sodium chloride Stopped (04/04/19 1736)  . sodium chloride 10 mL/hr at 03/29/19 2128  . amiodarone 30 mg/hr (04/09/19 0944)  . feeding supplement (VITAL 1.5 CAL) 50 mL/hr at 04/09/19 0700  . impella catheter heparin 25 unit/mL in dextrose 5%    . meropenem (MERREM) IV Stopped (04/09/19 0724)  . norepinephrine (LEVOPHED) Adult infusion 12 mcg/min (04/09/19 0900)  . prismasol BGK 4/2.5 1,500 mL/hr at 04/09/19 0943  . vasopressin (PITRESSIN) infusion - *FOR SHOCK* 0.03 Units/min (04/09/19 0900)    PRN Meds: sodium chloride, oxyCODONE **AND** acetaminophen, dextrose, fentaNYL (SUBLIMAZE) injection, heparin, midazolam, ondansetron (ZOFRAN) IV, sodium chloride flush, sodium chloride flush  Physical Exam       Well developed elderly gentleman.  NAD. On trach collar. Cor trak in place.  Upper extremities are swollen.  Vital Signs: BP (!) 108/52   Pulse 81   Temp 98.2 F (36.8 C) (Oral)   Resp (!) 27   Ht '5\' 8"'  (1.727 m)   Wt 87.7 kg   SpO2 100%   BMI 29.40 kg/m  SpO2: SpO2: 100 % O2 Device: O2 Device: Tracheostomy Collar O2 Flow Rate: O2 Flow Rate (L/min): 8  L/min  Intake/output summary:   Intake/Output Summary (Last 24 hours) at 04/09/2019 0952 Last data filed at 04/09/2019 0944 Gross per 24 hour  Intake 6746.05 ml  Output 9826 ml  Net -3079.95 ml   LBM: Last BM Date: 04/08/19 Baseline Weight: Weight: 92.1 kg Most recent weight: Weight: (Unable due to bed error)       Palliative Assessment/Data:   20%      Patient Active Problem List   Diagnosis Date Noted  . Pressure injury of skin 03/25/2019  . Acute respiratory failure with hypoxia ([place])   . S/P CABG x 3 03/16/2019  . Non-ST elevation (NSTEMI) myocardial infarction ([place])   . Acute on chronic combined systolic and diastolic CHF (congestive heart failure) ([place])   . Coronary artery disease due to lipid rich plaque   . Atrial fibrillation with RVR ([place]) 03/14/2019  . Atrial fibrillation with rapid ventricular response ([place]) 03/13/2019  . Chest pain 09/12/2018  . Chronic diastolic heart failure ([place]) 05/29/2018  . Hypertension 05/29/2018  . Non Hodgkin's lymphoma ([place]) 09/30/2017  . Heme positive stool 08/11/2017  . Chronic anticoagulation 07/05/2016  . CAD S/P percutaneous coronary angioplasty 07/05/2016  . Type 2 diabetes mellitus without complications ([place]) 79/43/2761  . Paroxysmal atrial fibrillation ([place]) 06/14/2016  . Chest pain in adult 06/14/2016  . Dyspnea on exertion 06/14/2016  . Allergic rhinitis 06/14/2016  . GERD (gastroesophageal reflux disease) 06/14/2016  . BPH (benign prostatic hyperplasia) 06/14/2016    Palliative Care Plan    Recommendations/Plan:  Limited code established.  If he ARRESTs then no CPR/ACLS/Defib.  If he has not arrested continue full scope treatment.  Engage larger family (in addition to son Jesse Murphy) in early supportive Palliative conversations.  PMT will continue to follow supporting the patient, family and medical team.  Goals of Care and Additional Recommendations:  Limitations on Scope of Treatment: Full Scope Treatment  Code Status:  Limited code  Prognosis:   Unable to determine.  He is at high risk of acute decline.  Will need to see how his medical status evolves over the rest of his hospitalization.  Discharge Planning:  To Be Determined.   Care plan was discussed with PMT Director,  Nephrology, bedside RN, patient, family.  Thank you for allowing the  Palliative Medicine Team to assist in the care of this patient.  Total time spent:  35 min.     Greater than 50%  of this time was spent counseling and coordinating care related to the above assessment and plan.  Florentina Jenny, PA-C Palliative Medicine  Please contact Palliative MedicineTeam phone at [phone] for questions and concerns between 7 am - 7 pm.   Please see AMION for individual provider pager numbers.

## 2019-04-09 NOTE — Progress Notes (Signed)
Happy to facilitate family PMT meeting and chaplain visit. Hope these energize patient's spirits.  Discussed case with Dr. Prescott Gum.   PMT will sign off.  Please re-consult of we can be of assistance to this very nice gentleman.  Florentina Jenny, PA-C Palliative Medicine Office:  [phone]

## 2019-04-10 ENCOUNTER — Inpatient Hospital Stay (HOSPITAL_COMMUNITY): Payer: Medicare Other

## 2019-04-10 ENCOUNTER — Inpatient Hospital Stay (HOSPITAL_COMMUNITY): Payer: Medicare Other | Admitting: Certified Registered"

## 2019-04-10 ENCOUNTER — Encounter (HOSPITAL_COMMUNITY)
Admission: EM | Disposition: A | Discharge status: Rehabilitation Facility | Payer: Self-pay | Source: Home / Self Care | Attending: Cardiothoracic Surgery

## 2019-04-10 DIAGNOSIS — I5021 Acute systolic (congestive) heart failure: Secondary | ICD-10-CM

## 2019-04-10 HISTORY — PX: REMOVAL OF IMPELLA LEFT VENTRICULAR ASSIST DEVICE: SHX6556

## 2019-04-10 LAB — COOXEMETRY PANEL
Carboxyhemoglobin: 2 % — ABNORMAL HIGH (ref 0.5–1.5)
Carboxyhemoglobin: 2.5 % — ABNORMAL HIGH (ref 0.5–1.5)
Methemoglobin: 1.1 % (ref 0.0–1.5)
Methemoglobin: 1.2 % (ref 0.0–1.5)
O2 Saturation: 63.7 %
O2 Saturation: 74.2 %
Total hemoglobin: 11.9 g/dL — ABNORMAL LOW (ref 12.0–16.0)
Total hemoglobin: 8.5 g/dL — ABNORMAL LOW (ref 12.0–16.0)

## 2019-04-10 LAB — CBC
HCT: 24.8 % — ABNORMAL LOW (ref 39.0–52.0)
HCT: 26.6 % — ABNORMAL LOW (ref 39.0–52.0)
Hemoglobin: 7.8 g/dL — ABNORMAL LOW (ref 13.0–17.0)
Hemoglobin: 8.5 g/dL — ABNORMAL LOW (ref 13.0–17.0)
MCH: 29.4 pg (ref 26.0–34.0)
MCH: 29.8 pg (ref 26.0–34.0)
MCHC: 31.5 g/dL (ref 30.0–36.0)
MCHC: 32 g/dL (ref 30.0–36.0)
MCV: 93.6 fL (ref 80.0–100.0)
MCV: 93.3 fL (ref 80.0–100.0)
Platelets: 134 10*3/uL — ABNORMAL LOW (ref 150–400)
Platelets: 136 10*3/uL — ABNORMAL LOW (ref 150–400)
RBC: 2.65 MIL/uL — ABNORMAL LOW (ref 4.22–5.81)
RBC: 2.85 MIL/uL — ABNORMAL LOW (ref 4.22–5.81)
RDW: 17.1 % — ABNORMAL HIGH (ref 11.5–15.5)
RDW: 16.2 % — ABNORMAL HIGH (ref 11.5–15.5)
WBC: 10.2 10*3/uL (ref 4.0–10.5)
WBC: 11.6 10*3/uL — ABNORMAL HIGH (ref 4.0–10.5)
nRBC: 0 % (ref 0.0–0.2)
nRBC: 0 % (ref 0.0–0.2)

## 2019-04-10 LAB — RENAL FUNCTION PANEL
Albumin: 2.2 g/dL — ABNORMAL LOW (ref 3.5–5.0)
Albumin: 2.2 g/dL — ABNORMAL LOW (ref 3.5–5.0)
Anion gap: 7 (ref 5–15)
Anion gap: 10 (ref 5–15)
BUN: 31 mg/dL — ABNORMAL HIGH (ref 8–23)
BUN: 40 mg/dL — ABNORMAL HIGH (ref 8–23)
CO2: 27 mmol/L (ref 22–32)
CO2: 22 mmol/L (ref 22–32)
Calcium: 7.8 mg/dL — ABNORMAL LOW (ref 8.9–10.3)
Calcium: 7.3 mg/dL — ABNORMAL LOW (ref 8.9–10.3)
Chloride: 104 mmol/L (ref 98–111)
Chloride: 102 mmol/L (ref 98–111)
Creatinine, Ser: 1.66 mg/dL — ABNORMAL HIGH (ref 0.61–1.24)
Creatinine, Ser: 2.25 mg/dL — ABNORMAL HIGH (ref 0.61–1.24)
GFR calc Af Amer: 42 mL/min — ABNORMAL LOW (ref >60)
GFR calc Af Amer: 29 mL/min — ABNORMAL LOW (ref >60)
GFR calc non Af Amer: 36 mL/min — ABNORMAL LOW (ref >60)
GFR calc non Af Amer: 25 mL/min — ABNORMAL LOW (ref >60)
Glucose, Bld: 108 mg/dL — ABNORMAL HIGH (ref 70–99)
Glucose, Bld: 167 mg/dL — ABNORMAL HIGH (ref 70–99)
Phosphorus: 2.6 mg/dL (ref 2.5–4.6)
Phosphorus: 3.1 mg/dL (ref 2.5–4.6)
Potassium: 4.8 mmol/L (ref 3.5–5.1)
Potassium: 5 mmol/L (ref 3.5–5.1)
Sodium: 138 mmol/L (ref 135–145)
Sodium: 134 mmol/L — ABNORMAL LOW (ref 135–145)

## 2019-04-10 LAB — MAGNESIUM: Magnesium: 2.4 mg/dL (ref 1.7–2.4)

## 2019-04-10 LAB — LACTATE DEHYDROGENASE: LDH: 242 U/L — ABNORMAL HIGH (ref 98–192)

## 2019-04-10 LAB — GLUCOSE, CAPILLARY
Glucose-Capillary: 112 mg/dL — ABNORMAL HIGH (ref 70–99)
Glucose-Capillary: 142 mg/dL — ABNORMAL HIGH (ref 70–99)
Glucose-Capillary: 155 mg/dL — ABNORMAL HIGH (ref 70–99)
Glucose-Capillary: 163 mg/dL — ABNORMAL HIGH (ref 70–99)
Glucose-Capillary: 192 mg/dL — ABNORMAL HIGH (ref 70–99)
Glucose-Capillary: 84 mg/dL (ref 70–99)

## 2019-04-10 LAB — PREPARE RBC (CROSSMATCH)

## 2019-04-10 SURGERY — REMOVAL, CARDIAC ASSIST DEVICE, IMPELLA
Anesthesia: General | Site: Chest

## 2019-04-10 MED ORDER — HEPARIN SODIUM (PORCINE) 1000 UNIT/ML DIALYSIS
1000.0000 [IU] | INTRAMUSCULAR | Status: DC | PRN
  Administered 2019-04-10: 10:00:00 3200 [IU] via INTRAVENOUS_CENTRAL
  Filled 2019-04-10: qty 4
  Filled 2019-04-10: qty 6

## 2019-04-10 MED ORDER — VITAL 1.5 CAL PO LIQD
1000.0000 mL | ORAL | Status: DC
  Filled 2019-04-10 (×4): qty 1000

## 2019-04-10 MED ORDER — MIDAZOLAM HCL 2 MG/2ML IJ SOLN
INTRAMUSCULAR | Status: AC
  Filled 2019-04-10: qty 2

## 2019-04-10 MED ORDER — SODIUM CHLORIDE 0.9 % IV SOLN
2.0000 g | Freq: Two times a day (BID) | INTRAVENOUS | Status: DC
  Administered 2019-04-11 – 2019-04-12 (×3): 2 g via INTRAVENOUS
  Filled 2019-04-10 (×5): qty 2

## 2019-04-10 MED ORDER — SODIUM CHLORIDE 0.9% IV SOLUTION: Freq: Once | INTRAVENOUS | Status: AC

## 2019-04-10 MED ORDER — ROCURONIUM BROMIDE 100 MG/10ML IV SOLN
INTRAVENOUS | Status: DC | PRN
  Administered 2019-04-10: 40 mg via INTRAVENOUS

## 2019-04-10 MED ORDER — 0.9 % SODIUM CHLORIDE (POUR BTL) OPTIME
TOPICAL | Status: DC | PRN
  Administered 2019-04-10: 2000 mL

## 2019-04-10 MED ORDER — VANCOMYCIN HCL 1000 MG IV SOLR
INTRAVENOUS | Status: AC
  Filled 2019-04-10: qty 1000

## 2019-04-10 MED ORDER — HEMOSTATIC AGENTS (NO CHARGE) OPTIME
TOPICAL | Status: DC | PRN
  Administered 2019-04-10: 1 via TOPICAL

## 2019-04-10 MED ORDER — VANCOMYCIN HCL 1000 MG IV SOLR
INTRAVENOUS | Status: DC | PRN
  Administered 2019-04-10: 1000 mg

## 2019-04-10 MED ORDER — DARBEPOETIN ALFA 40 MCG/0.4ML IJ SOSY
40.0000 ug | PREFILLED_SYRINGE | INTRAMUSCULAR | Status: DC
  Filled 2019-04-10: qty 0.4

## 2019-04-10 MED ORDER — SODIUM CHLORIDE 0.9% IV SOLUTION: Freq: Once | INTRAVENOUS | Status: DC

## 2019-04-10 MED ORDER — MIDAZOLAM HCL 2 MG/2ML IJ SOLN
INTRAMUSCULAR | Status: DC | PRN
  Administered 2019-04-10: 1 mg via INTRAVENOUS

## 2019-04-10 MED ORDER — FENTANYL CITRATE (PF) 250 MCG/5ML IJ SOLN
INTRAMUSCULAR | Status: AC
  Filled 2019-04-10: qty 5

## 2019-04-10 SURGICAL SUPPLY — 108 items
ADAPTER CARDIO PERF ANTE/RETRO (ADAPTER) ×1 IMPLANT
ATTRACTOMAT 16X20 MAGNETIC DRP (DRAPES) ×1 IMPLANT
BAG DECANTER FOR FLEXI CONT (MISCELLANEOUS) ×1 IMPLANT
BASKET HEART  (ORDER IN 25'S) (MISCELLANEOUS)
BASKET HEART (ORDER IN 25'S) (MISCELLANEOUS)
BASKET HEART (ORDER IN 25S) (MISCELLANEOUS) ×1 IMPLANT
BLADE CLIPPER SURG (BLADE) ×1 IMPLANT
BLADE STERNUM SYSTEM 6 (BLADE) ×1 IMPLANT
BLADE SURG 12 STRL SS (BLADE) ×1 IMPLANT
BNDG ELASTIC 4X5.8 VLCR STR LF (GAUZE/BANDAGES/DRESSINGS) ×1 IMPLANT
BNDG ELASTIC 6X5.8 VLCR STR LF (GAUZE/BANDAGES/DRESSINGS) ×1 IMPLANT
BNDG GAUZE ELAST 4 BULKY (GAUZE/BANDAGES/DRESSINGS) ×1 IMPLANT
BRUSH SCRUB EZ PLAIN DRY (MISCELLANEOUS) ×4 IMPLANT
CANISTER SUCT 3000ML PPV (MISCELLANEOUS) ×3 IMPLANT
CANNULA GUNDRY RCSP 15FR (MISCELLANEOUS) ×1 IMPLANT
CATH CPB KIT VANTRIGT (MISCELLANEOUS) ×1 IMPLANT
CATH ROBINSON RED A/P 18FR (CATHETERS) ×3 IMPLANT
CATH THORACIC 36FR RT ANG (CATHETERS) ×1 IMPLANT
CLIP VESOCCLUDE MED 24/CT (CLIP) ×2 IMPLANT
CLIP VESOCCLUDE MED 6/CT (CLIP) ×2 IMPLANT
CLIP VESOCCLUDE SM WIDE 6/CT (CLIP) ×2 IMPLANT
COVER SURGICAL LIGHT HANDLE (MISCELLANEOUS) ×1 IMPLANT
DRAIN CHANNEL 32F RND 10.7 FF (WOUND CARE) ×1 IMPLANT
DRAIN WOUND SNY 15 RND (WOUND CARE) ×2 IMPLANT
DRAPE CARDIOVASCULAR INCISE (DRAPES)
DRAPE SLUSH/WARMER DISC (DRAPES) ×1 IMPLANT
DRAPE SRG 135X102X78XABS (DRAPES) ×1 IMPLANT
DRSG AQUACEL AG ADV 3.5X 6 (GAUZE/BANDAGES/DRESSINGS) ×2 IMPLANT
DRSG AQUACEL AG ADV 3.5X14 (GAUZE/BANDAGES/DRESSINGS) ×1 IMPLANT
DRSG XEROFORM 1X8 (GAUZE/BANDAGES/DRESSINGS) ×2 IMPLANT
ELECT BLADE 4.0 EZ CLEAN MEGAD (MISCELLANEOUS)
ELECT BLADE 6.5 EXT (BLADE) ×1 IMPLANT
ELECT CAUTERY BLADE 6.4 (BLADE) ×1 IMPLANT
ELECT REM PT RETURN 9FT ADLT (ELECTROSURGICAL)
ELECTRODE BLDE 4.0 EZ CLN MEGD (MISCELLANEOUS) ×1 IMPLANT
ELECTRODE REM PT RTRN 9FT ADLT (ELECTROSURGICAL) ×2 IMPLANT
FELT TEFLON 1X6 (MISCELLANEOUS) ×2 IMPLANT
GAUZE SPONGE 4X4 12PLY STRL (GAUZE/BANDAGES/DRESSINGS) ×4 IMPLANT
GLOVE BIO SURGEON STRL SZ7.5 (GLOVE) ×7 IMPLANT
GOWN STRL REUS W/ TWL LRG LVL3 (GOWN DISPOSABLE) ×4 IMPLANT
GOWN STRL REUS W/TWL LRG LVL3 (GOWN DISPOSABLE) ×4
HANDLE STAPLE ENDO GIA SHORT (STAPLE) ×1
HEMOSTAT POWDER SURGIFOAM 1G (HEMOSTASIS) ×3 IMPLANT
HEMOSTAT SURGICEL 2X14 (HEMOSTASIS) ×1 IMPLANT
INSERT FOGARTY SM (MISCELLANEOUS) ×2 IMPLANT
INSERT FOGARTY XLG (MISCELLANEOUS) IMPLANT
KIT BASIN OR (CUSTOM PROCEDURE TRAY) ×1 IMPLANT
KIT REMOVER STAPLE SKIN (MISCELLANEOUS) ×2 IMPLANT
KIT SUCTION CATH 14FR (SUCTIONS) ×1 IMPLANT
KIT TURNOVER KIT B (KITS) ×1 IMPLANT
KIT VASOVIEW HEMOPRO 2 VH 4000 (KITS) ×1 IMPLANT
LEAD PACING MYOCARDI (MISCELLANEOUS) ×1 IMPLANT
MARKER GRAFT CORONARY BYPASS (MISCELLANEOUS) ×3 IMPLANT
NS IRRIG 1000ML POUR BTL (IV SOLUTION) ×9 IMPLANT
PACK E OPEN HEART (SUTURE) ×1 IMPLANT
PACK GENERAL/GYN (CUSTOM PROCEDURE TRAY) ×2 IMPLANT
PACK OPEN HEART (CUSTOM PROCEDURE TRAY) ×1 IMPLANT
PAD ARMBOARD 7.5X6 YLW CONV (MISCELLANEOUS) ×6 IMPLANT
PAD ELECT DEFIB RADIOL ZOLL (MISCELLANEOUS) ×3 IMPLANT
PENCIL BUTTON HOLSTER BLD 10FT (ELECTRODE) ×1 IMPLANT
POSITIONER HEAD DONUT 9IN (MISCELLANEOUS) ×1 IMPLANT
PUNCH AORTIC ROTATE 4.0MM (MISCELLANEOUS) IMPLANT
PUNCH AORTIC ROTATE 4.5MM 8IN (MISCELLANEOUS) IMPLANT
PUNCH AORTIC ROTATE 5MM 8IN (MISCELLANEOUS) IMPLANT
RELOAD TRI 2.0 30 VAS MED SUL (STAPLE) ×2 IMPLANT
SEALANT PATCH FIBRIN 2X4IN (MISCELLANEOUS) ×2 IMPLANT
SET TUBE SMOKE EVAC HIGH FLOW (TUBING) ×1 IMPLANT
STAPLER ENDO GIA 12 SHRT THIN (STAPLE) IMPLANT
STAPLER ENDO GIA 12MM SHORT (STAPLE) ×2 IMPLANT
STOPCOCK 4 WAY LG BORE MALE ST (IV SETS) ×2 IMPLANT
SURGIFLO W/THROMBIN 8M KIT (HEMOSTASIS) ×1 IMPLANT
SUT BONE WAX W31G (SUTURE) ×1 IMPLANT
SUT ETHILON 3 0 FSL (SUTURE) ×2 IMPLANT
SUT MNCRL AB 4-0 PS2 18 (SUTURE) IMPLANT
SUT PROLENE 3 0 SH DA (SUTURE) IMPLANT
SUT PROLENE 3 0 SH1 36 (SUTURE) IMPLANT
SUT PROLENE 4 0 RB 1 (SUTURE) ×8
SUT PROLENE 4 0 SH DA (SUTURE) ×5 IMPLANT
SUT PROLENE 4-0 RB1 .5 CRCL 36 (SUTURE) ×1 IMPLANT
SUT PROLENE 5 0 C 1 36 (SUTURE) IMPLANT
SUT PROLENE 6 0 C 1 30 (SUTURE) IMPLANT
SUT PROLENE 6 0 CC (SUTURE) ×3 IMPLANT
SUT PROLENE 8 0 BV175 6 (SUTURE) IMPLANT
SUT PROLENE BLUE 7 0 (SUTURE) ×1 IMPLANT
SUT SILK  1 MH (SUTURE)
SUT SILK 1 MH (SUTURE) IMPLANT
SUT SILK 2 0 SH CR/8 (SUTURE) ×2 IMPLANT
SUT SILK 3 0 SH CR/8 (SUTURE) IMPLANT
SUT STEEL 6MS V (SUTURE) ×2 IMPLANT
SUT STEEL SZ 6 DBL 3X14 BALL (SUTURE) ×1 IMPLANT
SUT VIC AB 1 CTX 18 (SUTURE) ×2 IMPLANT
SUT VIC AB 1 CTX 36 (SUTURE)
SUT VIC AB 1 CTX36XBRD ANBCTR (SUTURE) ×2 IMPLANT
SUT VIC AB 2-0 CT1 27 (SUTURE) ×2
SUT VIC AB 2-0 CT1 TAPERPNT 27 (SUTURE) IMPLANT
SUT VIC AB 2-0 CTX 27 (SUTURE) IMPLANT
SUT VIC AB 3-0 SH 18 (SUTURE) ×2 IMPLANT
SUT VIC AB 3-0 X1 27 (SUTURE) ×2 IMPLANT
SYR 10ML LL (SYRINGE) ×2 IMPLANT
SYSTEM SAHARA CHEST DRAIN ATS (WOUND CARE) ×1 IMPLANT
TAPE CLOTH SOFT 2X10 (GAUZE/BANDAGES/DRESSINGS) ×2 IMPLANT
TOWEL GREEN STERILE (TOWEL DISPOSABLE) ×1 IMPLANT
TOWEL GREEN STERILE FF (TOWEL DISPOSABLE) ×1 IMPLANT
TRAY CATH LUMEN 1 20CM STRL (SET/KITS/TRAYS/PACK) ×2 IMPLANT
TRAY FOLEY SLVR 16FR TEMP STAT (SET/KITS/TRAYS/PACK) ×1 IMPLANT
TUBING ART PRESS 48 MALE/FEM (TUBING) ×4 IMPLANT
UNDERPAD 30X30 (UNDERPADS AND DIAPERS) ×1 IMPLANT
WATER STERILE IRR 1000ML POUR (IV SOLUTION) ×2 IMPLANT

## 2019-04-10 NOTE — Progress Notes (Signed)
Pt currently of ventilator, machine on stand by at pt bedside. Pt on ATC 5L/28% tolerating well at this time. RT will continue to monitor pt status.

## 2019-04-10 NOTE — Progress Notes (Signed)
PT Cancellation Note  Patient Details Name: Jesse Murphy CLEAR MRN: [id number] DOB: Jan 14, 1929   Cancelled Treatment:    Reason Eval/Treat Not Completed: Other (comment) PT held today. Per RN trying to wean impella and pt going back to OR today. PT will continue to follow acutely.    Earney Navy, PTA Acute Rehabilitation Services Pager: [phone] Office: [phone]   04/10/2019, 11:14 AM

## 2019-04-10 NOTE — Progress Notes (Signed)
CRRT filter clotted. Per Bensimhon MD, leave it off until patient returns from OR.

## 2019-04-10 NOTE — Progress Notes (Signed)
Pre Procedure note for inpatients:   Jesse Murphy has been scheduled for Procedure(s): TRACHEOSTOMY placed using [place] 8DCT Cuffed. (N/A) Placement Of Impella Left Ventricular Assist Device using ABIOMED Impella 5.5 with SmartAssist Device. (Right) today. The various methods of treatment have been discussed with the patient. After consideration of the risks, benefits and treatment options the patient has consented to the planned procedure.   The patient has been seen and labs reviewed. There are no changes in the patient's condition to prevent proceeding with the planned procedure today.  Recent labs:  Lab Results  Component Value Date   WBC 11.6 (H) 04/10/2019   HGB 8.5 (L) 04/10/2019   HCT 26.6 (L) 04/10/2019   PLT 136 (L) 04/10/2019   GLUCOSE 167 (H) 04/10/2019   CHOL 117 03/16/2019   TRIG 52 03/16/2019   HDL 33 (L) 03/16/2019   LDLCALC 74 03/16/2019   ALT 25 04/04/2019   AST 39 04/04/2019   NA 134 (L) 04/10/2019   K 5.0 04/10/2019   CL 102 04/10/2019   CREATININE 2.25 (H) 04/10/2019   BUN 40 (H) 04/10/2019   CO2 22 04/10/2019   TSH 3.683 03/13/2019   INR 1.7 (H) 03/27/2019   HGBA1C 6.1 (H) 03/13/2019    Len Childs, MD 04/10/2019 4:14 PM

## 2019-04-10 NOTE — Progress Notes (Signed)
Per RN she spoke with Dr. Prescott Gum about pt's trach change due today (04/10/2019) and was told that they would change it tomorrow (04/11/2019). RT will follow up tomorrow concerning trach change.

## 2019-04-10 NOTE — Anesthesia Preprocedure Evaluation (Addendum)
Anesthesia Evaluation  Patient identified by MRN, date of birth, ID band Patient awake    Reviewed: Allergy & Precautions, NPO status , Patient's Chart, lab work & pertinent test results  History of Anesthesia Complications Negative for: history of anesthetic complications  Airway Mallampati: Trach       Dental  (+) Dental Advisory Given   Pulmonary shortness of breath, former smoker,    + rhonchi        Cardiovascular hypertension, + CAD, + Past MI and +CHF  + dysrhythmias Atrial Fibrillation  Rhythm:Regular  impella in place and weaned   Neuro/Psych negative neurological ROS  negative psych ROS   GI/Hepatic Neg liver ROS, GERD  Medicated,  Endo/Other  diabetes  Renal/GU negative Renal ROS     Musculoskeletal negative musculoskeletal ROS (+)   Abdominal   Peds  Hematology negative hematology ROS (+)   Anesthesia Other Findings   Reproductive/Obstetrics                            Echo: 1. Left ventricular ejection fraction, by visual estimation, is 25 to  30%. The left ventricle has severely decreased function. There is no  increased left ventricular wall thickness.  2. Abnormal septal motion consistent with left bundle branch block.  3. The left ventricle demonstrates global hypokinesis. Images inadequate  for focal wall motion abnormalities.  4. Left ventricular diastolic parameters are indeterminate due to atrial  fibrillation.  5. Global right ventricle has normal systolc function.The right  ventricular size is normal. no increase in right ventricular wall  thickness.  6. Mild to moderate mitral annular calcification. No evidence of mitral  valve regurgitation. No evidence of mitral stenosis.  7. The tricuspid valve was normal in structure. Tricuspid valve  regurgitation is not demonstrated.  8. There is heavy focal calcification of the right coronary cusp. The  mean AV  gradient is 54mmHg which may be underestimated in the setting of  severe LV dysfunction.  9. The pulmonic valve was normal in structure.  10. The inferior vena cava is normal in size with greater than 50%  respiratory variability, suggesting right atrial pressure of 3 mmHg.  11. Small pericardial effusion.  12. The pericardial effusion is posterior to the left ventricle.   Anesthesia Physical Anesthesia Plan  ASA: IV  Anesthesia Plan: General   Post-op Pain Management:    Induction: Inhalational  PONV Risk Score and Plan: 3 and Ondansetron and Treatment may vary due to age or medical condition  Airway Management Planned: Tracheostomy  Additional Equipment: Arterial line, CVP and TEE  Intra-op Plan:   Post-operative Plan: Post-operative intubation/ventilation  Informed Consent:   Plan Discussed with: CRNA  Anesthesia Plan Comments:         Anesthesia Quick Evaluation

## 2019-04-10 NOTE — Progress Notes (Addendum)
Nephrology Progress Note:  Patient ID: Jesse Murphy, male   DOB: [birth year], 86 y.o.   MRN: [id number]   S:  Impella support turned down per cardiology/CHF yesterday. Seen and examined on CRRT. Procedure supervised. Palliative has continued to follow.  Pursuing aggressive care at this time.  UOP is inaccurate given bladder irrigation.  He had 3.0 liters UF with CRRT over 2/22.  UF goal zero to net neg 50 an hour as tol .  Has been on levo at 10 mcg/min and on vaso.  Still on trach collar.  Feels ok.  CVP has been 8-10 per nursing.  Review of systems:  Denies air hunger  Denies n/v Unable to quantify urine - bladder irrigation   O:BP (!) 100/44   Pulse 77   Temp 98.2 F (36.8 C)   Resp (!) 22   Ht '5\' 8"'  (1.727 m)   Wt 87.7 kg   SpO2 98%   BMI 29.40 kg/m   Intake/Output Summary (Last 24 hours) at 04/10/2019 0553 Last data filed at 04/10/2019 0525 Gross per 24 hour  Intake 10378.92 ml  Output 10292 ml  Net 86.92 ml   Intake/Output: I/O last 3 completed shifts: In: 14630.8 [I.V.:1486.3; Blood:315; NLGXQ:1194.1; NG/GT:2480; IV Piggyback:900.4] Out: 17408 [Urine:9730; Emesis/NG output:1025; XKGYJ:8563]  Intake/Output this shift:  Total I/O In: 4928.1 [I.V.:975.1; Blood:30; JSHFW:2637; NG/GT:710; IV Piggyback:100] Out: 8588 [Urine:3725; Other:1267] Weight change:    Physical exam:    Gen: elderly male in bed chronically ill-appearing HEENT trach in place; off vent  CVS: S1 S2 no rub Resp: coarse breath sounds; unlabored at rest  Abd: soft/ NT/ND  Ext: trace lower extremity edema Neuro - awake and interactive and follows commands, nods Psych - no anxiety or agitation  Access: left chest tunneled catheter   Recent Labs  Lab 04/04/19 0355 04/04/19 0418 04/07/19 0431 04/07/19 1407 04/08/19 0323 04/08/19 1543 04/09/19 0338 04/09/19 1505 04/10/19 0345  NA 133*   < > 135 135 132* 136 135 138 138  K 4.3   < > 4.9 4.7 4.4 4.8 4.7 4.8 4.8  CL 98   < > 100 101 101 102 100  104 104  CO2 22   < > '23 25 24 25 25 25 27  ' GLUCOSE 196*   < > 154* 132* 198* 170* 172* 153* 108*  BUN 38*   < > 37* 36* 36* 35* 36* 34* 31*  CREATININE 1.77*   < > 2.06* 1.85* 1.79* 1.75* 1.75* 1.77* 1.66*  ALBUMIN 2.2*   < > 2.5* 2.6* 2.3* 2.3* 2.4* 2.2* 2.2*  CALCIUM 7.5*   < > 7.4* 7.8* 7.2* 7.6* 7.6* 7.4* 7.8*  PHOS  --    < > 2.4* 2.3* 2.5 2.6 2.6 3.2 2.6  AST 39  --   --   --   --   --   --   --   --   ALT 25  --   --   --   --   --   --   --   --    < > = values in this interval not displayed.   Liver Function Tests: Recent Labs  Lab 04/04/19 0355 04/04/19 1457 04/09/19 0338 04/09/19 1505 04/10/19 0345  AST 39  --   --   --   --   ALT 25  --   --   --   --   ALKPHOS 73  --   --   --   --  BILITOT 0.9  --   --   --   --   PROT 5.5*  --   --   --   --   ALBUMIN 2.2*   < > 2.4* 2.2* 2.2*   < > = values in this interval not displayed.    CBC: Recent Labs  Lab 04/06/19 1233 04/07/19 0405 04/07/19 0430 04/07/19 0430 04/08/19 0323 04/08/19 0323 04/09/19 0338 04/09/19 1400 04/10/19 0345  WBC 16.6*  --  12.3*   < > 11.5*   < > 10.0 10.8* 10.2  NEUTROABS 13.7*  --   --   --   --   --   --   --   --   HGB 8.4*   < > 7.1*   < > 7.8*   < > 7.0* 8.0* 7.8*  HCT 26.0*   < > 22.5*   < > 24.8*   < > 22.5* 25.1* 24.8*  MCV 91.5  --  91.8  --  92.9  --  94.5 93.0 93.6  PLT 74*  --  71*   < > 84*   < > 111* 113* 134*   < > = values in this interval not displayed.   Cardiac Enzymes: No results for input(s): CKTOTAL, CKMB, CKMBINDEX, TROPONINI in the last 168 hours. CBG: Recent Labs  Lab 04/09/19 1209 04/09/19 1613 04/09/19 1939 04/10/19 0059 04/10/19 0341  GLUCAP 165* 144* 156* 155* 112*   IStudies/Results: DG Chest Port 1 View  Result Date: 04/08/2019 CLINICAL DATA:  CABG EXAM: PORTABLE CHEST 1 VIEW COMPARISON:  Yesterday FINDINGS: Impella device from the right with stable positioning. Bilateral central line with tips at the SVC. CABG and left atrial clipping.  Haziness of the left more than right chest likely from atelectasis and pleural fluid. No visible pneumothorax. The tracheostomy tube is in good position. IMPRESSION: 1. Stable hardware. 2. Stable mild hazy opacity likely reflecting dependent atelectasis and pleural fluid. Electronically Signed   By: Monte Fantasia M.D.   On: 04/08/2019 06:11   . amiodarone  150 mg Intravenous Once  . aspirin  81 mg Per Tube Daily  . atorvastatin  80 mg Per Tube q1800  . B-complex with vitamin C  1 tablet Oral Daily  . chlorhexidine gluconate (MEDLINE KIT)  15 mL Mouth Rinse BID  . Chlorhexidine Gluconate Cloth  6 each Topical Daily  . feeding supplement (PRO-STAT SUGAR FREE 64)  60 mL Per Tube BID  . Gerhardt's butt cream   Topical BID  . hydrocortisone   Rectal BID  . insulin aspart  0-24 Units Subcutaneous Q4H  . insulin aspart  5 Units Subcutaneous Q4H  . insulin glargine  26 Units Subcutaneous Daily  . mouth rinse  15 mL Mouth Rinse 10 times per day  . midodrine  10 mg Oral TID WC  . pantoprazole (PROTONIX) IV  40 mg Intravenous Q24H  . sodium chloride flush  10-40 mL Intracatheter Q12H  . tamsulosin  0.4 mg Oral Daily    BMET    Component Value Date/Time   NA 138 04/10/2019 0345   K 4.8 04/10/2019 0345   CL 104 04/10/2019 0345   CO2 27 04/10/2019 0345   GLUCOSE 108 (H) 04/10/2019 0345   BUN 31 (H) 04/10/2019 0345   CREATININE 1.66 (H) 04/10/2019 0345   CALCIUM 7.8 (L) 04/10/2019 0345   GFRNONAA 36 (L) 04/10/2019 0345   GFRAA 42 (L) 04/10/2019 0345   CBC    Component Value  Date/Time   WBC 10.2 04/10/2019 0345   RBC 2.65 (L) 04/10/2019 0345   HGB 7.8 (L) 04/10/2019 0345   HCT 24.8 (L) 04/10/2019 0345   PLT 134 (L) 04/10/2019 0345   MCV 93.6 04/10/2019 0345   MCH 29.4 04/10/2019 0345   MCHC 31.5 04/10/2019 0345   RDW 17.1 (H) 04/10/2019 0345   LYMPHSABS 0.5 (L) 04/06/2019 1233   MONOABS 1.9 (H) 04/06/2019 1233   EOSABS 0.2 04/06/2019 1233   BASOSABS 0.0 04/06/2019 1233     Assessment/Plan:  1. AKI(Cr was 0.7 prior to surgery)- due to postoperative CHF/cardiogenic shock that was refractory to diuretics and started on CRRT on 03/29/19 for volume removal.   1. Keep even to net neg 50 an hour as tolerated   2. Prev nephrologist discussed case with Dr. Haroldine Laws and plan is to get CVP to 5-8 while not having to increase pressors in the hopes of weaning impella. 3. Offering CRRT as long as aligns with goals of care and as long as able to tolerate; note goals of care discussions ongoing  2. Acute systolic CHF/cardiogenic shock- EF 25-30%; s/p Impella. Continue with CVVHD and UF as tolerated. 3. CAD s/p CABG on 06/05/01 complicated by cardiogenic shock and ARF. 4. Acute hypoxic respiratory failure with pseudomonas pneumonia- s/p trach on 03/27/19. abx per PCCM 5. PAF- s/p MAZE and LAA occlusion- therapy per CHF team 6. Hematuria- bladder irrigation 7. ABLA- Hx Melena. Transfuse as needed. PRBC's on 2/22.  8. Protein malnutrition- moderate to severe 9. Hypophosphatemia- replete as needed 10. Vascular access- left [place] HD catheter placed 03/29/19 11. Disposition- He is not a longterm dialysis candidate given his cardiogenic shock and trach.   Claudia Desanctis, MD 04/10/2019 6:07 AM   Spoke with advanced CHF.  They would like to hold CRRT x 1-2 days and reassess. Doubt that he would tolerate iHD well.   Claudia Desanctis, MD 04/10/2019 1:17 PM

## 2019-04-10 NOTE — Progress Notes (Signed)
[facility] for Anticoagulation management/assistance  Indication: Impella 5.5  No Known Allergies  Patient Measurements: Height: 5\' 8"  (172.7 cm) Weight: (unable to weigh due to bed error) IBW/kg (Calculated) : 68.4 Heparin Dosing Weight: 86kg  Vital Signs: Temp: 97.9 F (36.6 C) (02/23 0735) Temp Source: Oral (02/23 0735) BP: 100/68 (02/23 0830) Pulse Rate: 69 (02/23 0830)  Labs: Recent Labs    04/09/19 0338 04/09/19 0338 04/09/19 1400 04/09/19 1505 04/10/19 0345  HGB 7.0*   < > 8.0*  --  7.8*  HCT 22.5*  --  25.1*  --  24.8*  PLT 111*  --  113*  --  134*  CREATININE 1.75*  --   --  1.77* 1.66*   < > = values in this interval not displayed.    Estimated Creatinine Clearance: 33.1 mL/min (A) (by C-G formula based on SCr of 1.66 mg/dL (H)).  Assessment: 86 year old male s/p CABG 1/29 with MAZE and LAA. Patient taken to OR 2/9 for tracheostomy and impella 5.5 placement.   Purge (25 units/ml of heparin) is running at 12.3 ml/hr, which provides 307 units/hr of heparin. Impella P5 turned to P3 - plan to possibly remove 2/23.   Systemic heparin gtt was turned off recurrent hematuria and anemia. Received 1 PRBC today given Hgb 7.8, plt increasing to 134. LDH 242.  Urine still remains bloody - no new melena.  Goal of Therapy:  Monitor platelets by anticoagulation protocol: Yes   Plan:   Continue 1/2 strength heparin purge per MD No systemic heparin  Monitor CBC, s/sx bleeding  Antonietta Jewel, PharmD, BCCCP Clinical Pharmacist  Phone: [phone]  Please check AMION for all [facility] phone numbers After 10:00 PM, call [place] [phone] 04/10/2019

## 2019-04-10 NOTE — Brief Op Note (Signed)
04/10/2019  6:49 PM  PATIENT:  Jesse Murphy  86 y.o. male  PRE-OPERATIVE DIAGNOSIS:  POST CORONARY ARTERY BYPASS GRAFTING  POST-OPERATIVE DIAGNOSIS:  POST CORONARY ARTERY BYPASS GRAFTING  PROCEDURE:  Procedure(s): REMOVAL OF IMPELLA LEFT VENTRICULAR ASSIST DEVICE WITH INSERTION OF RIGHT FEMORAL ARTERIAL LINE (N/A)  SURGEON:  Surgeon(s) and Role:    Ivin Poot, MD - Primary  PHYSICIAN ASSISTANT: none  ASSISTANTS: none   ANESTHESIA:   general   EBL:  100 cc   BLOOD ADMINISTERED:none  DRAINS: (12F) Jackson-Pratt drain(s) with closed bulb suction in the in the R subclavicular pocket   LOCAL MEDICATIONS USED:  NONE  SPECIMEN:  No Specimen  DISPOSITION OF SPECIMEN:  N/A  COUNTS:  correct  TOURNIQUET:  * No tourniquets in log *  DICTATION: .Dragon Dictation  PLAN OF CARE: return to ICU  PATIENT DISPOSITION:  ICU - intubated and hemodynamically stable.   Delay start of Pharmacological VTE agent (>24hrs) due to surgical blood loss or risk of bleeding: yes

## 2019-04-10 NOTE — Transfer of Care (Signed)
Immediate Anesthesia Transfer of Care Note  Patient: Jesse Murphy  Procedure(s) Performed: REMOVAL OF IMPELLA LEFT VENTRICULAR ASSIST DEVICE WITH INSERTION OF RIGHT FEMORAL ARTERIAL LINE (N/A Chest)  Patient Location: ICU  Anesthesia Type:General  Level of Consciousness: awake  Airway & Oxygen Therapy: Patient placed on Ventilator (see vital sign flow sheet for setting)  Post-op Assessment: Report given to RN, Post -op Vital signs reviewed and stable and Patient moving all extremities  Post vital signs: Reviewed and stable  Last Vitals:  Vitals Value Taken Time  BP    Temp    Pulse    Resp    SpO2      Last Pain:  Vitals:   04/10/19 1600  TempSrc: Oral  PainSc: 0-No pain      Patients Stated Pain Goal: 0 (70/78/67 5449)  Complications: No apparent anesthesia complications

## 2019-04-10 NOTE — Progress Notes (Signed)
Nutrition Follow up   DOCUMENTATION CODES:   Not applicable  INTERVENTION:   Increase tube feeding:  -Vital 1.5 @ 55 ml/hr via [name] (1320 ml) -60 ml Prostat BID -B complex with Vitamin C  Provides: 2380 kcals, 149 grams protein, 1008 ml free water.   NUTRITION DIAGNOSIS:   Increased nutrient needs related to post-op healing as evidenced by estimated needs.  Ongoing  GOAL:   Patient will meet greater than or equal to 90% of their needs   Addressed via TF   MONITOR:   Weight trends, Diet advancement, Vent status, Skin, TF tolerance, Labs, I & O's  REASON FOR ASSESSMENT:   Consult Enteral/tube feeding initiation and management  ASSESSMENT:   Patient with PMH significant for CAD s/p PCI, prostate cancer, HTN, CHF, and DM. Presents this admission with chronic a.fib with RVR.   1/28- s/p L heart cath  1/30- s/p CABG x3, MAZE, extubated  2/2- re-intubated  2/5- extubated 2/8- re-intubated  2/10- s/p impella, trach, NGT placed-start trickle feeding 2/11- start CRRT  Pt discussed during ICU rounds and with RN.   Pt now tolerating ATC. For Impella removal this afternoon. May need L thoracentesis. Continues on CRRT (held for surgery). Requiring continuous bladder irrigation. Tolerating TF at goal per RN.   Admission weight: 89.3 kg  Current weight: 87.7 kg (down 10 kg in last week)  I/O: -18,570 ml since 2/9 UOP: 7,285 ml x 24 hrs CRRT: 3,175 ml x 24 hrs   Drips: levophed, vasopressin  Medications: b complex with Vit C, aranesp, SS novolog, lantus Labs: Cr 1.66- trending down CBG 108-192  Diet Order:   Diet Order            Diet NPO time specified  Diet effective now              EDUCATION NEEDS:   Not appropriate for education at this time  Skin:  Skin Assessment: Skin Integrity Issues: Skin Integrity Issues:: Stage I, DTI DTI: chest Stage I: vertebral column Incisions: chest, leg  Last BM:  2/23  Height:   Ht Readings from Last 1  Encounters:  03/26/19 5\' 8"  (1.727 m)    Weight:   Wt Readings from Last 1 Encounters:  02/06/19 91.4 kg    Ideal Body Weight:  70 kg  BMI:  Body mass index is 29.4 kg/m.  Estimated Nutritional Needs:   Kcal:  2200-2400 kcal  Protein:  135-155 grams  Fluid:  >/= 2.2 L/day   Mariana Single RD, LDN Clinical Nutrition Pager # - [phone]

## 2019-04-10 NOTE — Progress Notes (Addendum)
Pharmacy Antibiotic Note  EZEKIAH MASSIE is a 86 y.o. male  with pseudomonal PNA on meropenem day 4  (recenlty completed a course of fortaz for pseudomonas on 2/16) . Plans are to hold CRRT for 1-2 days.  -WBC= 11.6, afebrile, SCr= 1.66 -last day of antibiotics are scheduled as 2/26   Plan: -Meropenem 2gm IV q12h (stop after last dose on 2/26_ -Will follow renal function, cultures and clinical progress   Height: 5\' 8"  (172.7 cm) Weight: (unable to weigh due to bed error) IBW/kg (Calculated) : 68.4  Temp (24hrs), Avg:98.4 F (36.9 C), Min:97.9 F (36.6 C), Max:99.7 F (37.6 C)  Recent Labs  Lab 04/04/19 1457 04/05/19 0505 04/08/19 0323 04/08/19 1543 04/09/19 0338 04/09/19 1400 04/09/19 1505 04/10/19 0345 04/10/19 1005  WBC 17.8*   < > 11.5*  --  10.0 10.8*  --  10.2 11.6*  CREATININE 1.76*   < > 1.79* 1.75* 1.75*  --  1.77* 1.66*  --   VANCORANDOM 15  --   --   --   --   --   --   --   --    < > = values in this interval not displayed.    Estimated Creatinine Clearance: 33.1 mL/min (A) (by C-G formula based on SCr of 1.66 mg/dL (H)).    No Known Allergies  Antimicrobials this admission: CTX 2/1 >> 2/7 Cefepime 2/8 >> 2/10 Fortaz 2/10 >> 2/16 Vanc 2/16>>2/19 Meropenem 2/20>>   Microbiology results: 2/20 blood- ngtd 2/20 respiratory cx-  Pseudomonas (intermediate sensitivity to fortaz) 2/16 blood x 2: neg 2/16 TA: pseudomonas, few c.tropicalis 2/8 resp cx - Pseudomonas- R - cefepime (not included on report, S fortaz,cipro, gent, primaxin) 2/1 TA: normal flora 1/28 MRSA: neg 1/26 COVID/flu: neg  Thank you for allowing pharmacy to be a part of this patient's care.  Hildred Laser, PharmD Clinical Pharmacist **Pharmacist phone directory can now be found on [email or web address] ([email or web address]).  Listed under [facility].

## 2019-04-10 NOTE — Progress Notes (Signed)
Patient ID: Jesse Murphy, male   DOB: 06/05/1928, 86 y.o.   MRN: [id number]    Advanced Heart Failure Rounding Note  PCP-Cardiologist: Kate Sable, MD   Subjective:    Remains on impella 5.5 support, P-4, Flow 3.1. Co-ox 64 %  Remains on trach collar without any respiratory difficulty.   On CVVHD essentially keeping even CVP 6. Hard to track u/o with CBI  On NE 13 and VP 0.02  No further melena. Heparin off. Continues w/heparin in impella purge. Urology following. Hgb 7.8 today. Ordered to get 1 unit of blood per Dr. Prescott Gum  Had some more AF last night. Now back in NSR. Remains on IV amio   Denies CP or SOB.   Objective:   Weight Range: 87.7 kg Body mass index is 29.4 kg/m.   Vital Signs:   Temp:  [97.9 F (36.6 C)-99.7 F (37.6 C)] 97.9 F (36.6 C) (02/23 0735) Pulse Rate:  [27-121] 81 (02/23 0945) Resp:  [12-33] 23 (02/23 0945) BP: (83-125)/(30-77) 87/65 (02/23 0945) SpO2:  [84 %-100 %] 100 % (02/23 0945) FiO2 (%):  [28 %-40 %] 28 % (02/23 0800) Last BM Date: 04/10/19  Weight change: Filed Weights   04/06/19 1400  Weight: 87.7 kg    Intake/Output:   Intake/Output Summary (Last 24 hours) at 04/10/2019 1008 Last data filed at 04/10/2019 1000 Gross per 24 hour  Intake 10210.95 ml  Output 10142 ml  Net 68.95 ml      Physical Exam   CVP 6-7 General:  Critically ill WM, on vent through TC, + bair hugger HEENT: normal + cor-trak Neck: supple. + trachCarotids 2+ bilat; no bruits. No lymphadenopathy or thryomegaly appreciated. Cor: PMI nondisplaced. Mildly irregular sternal dressing. Impella site and trialysis cath ok  Lungs: clear Abdomen: soft, nontender, nondistended. No hepatosplenomegaly. No bruits or masses. Good bowel sounds. Extremities: no cyanosis, clubbing, rash, tr edema Neuro: alert follows commands  Telemetry   NSR 70-80s Personally reviewed  Labs    CBC Recent Labs    04/09/19 1400 04/10/19 0345  WBC 10.8* 10.2  HGB  8.0* 7.8*  HCT 25.1* 24.8*  MCV 93.0 93.6  PLT 113* 809*   Basic Metabolic Panel Recent Labs    04/09/19 0338 04/09/19 0338 04/09/19 1505 04/10/19 0345  NA 135   < > 138 138  K 4.7   < > 4.8 4.8  CL 100   < > 104 104  CO2 25   < > 25 27  GLUCOSE 172*   < > 153* 108*  BUN 36*   < > 34* 31*  CREATININE 1.75*   < > 1.77* 1.66*  CALCIUM 7.6*   < > 7.4* 7.8*  MG 2.4  --   --  2.4  PHOS 2.6   < > 3.2 2.6   < > = values in this interval not displayed.   Liver Function Tests Recent Labs    04/09/19 1505 04/10/19 0345  ALBUMIN 2.2* 2.2*   No results for input(s): LIPASE, AMYLASE in the last 72 hours. Cardiac Enzymes No results for input(s): CKTOTAL, CKMB, CKMBINDEX, TROPONINI in the last 72 hours.  BNP: BNP (last 3 results) Recent Labs    03/22/19 0401 03/26/19 0626 03/27/19 1255  BNP 340.4* 687.5* 800.4*    ProBNP (last 3 results) No results for input(s): PROBNP in the last 8760 hours.   D-Dimer No results for input(s): DDIMER in the last 72 hours. Hemoglobin A1C No results for input(s):  HGBA1C in the last 72 hours. Fasting Lipid Panel No results for input(s): CHOL, HDL, LDLCALC, TRIG, CHOLHDL, LDLDIRECT in the last 72 hours. Thyroid Function Tests No results for input(s): TSH, T4TOTAL, T3FREE, THYROIDAB in the last 72 hours.  Invalid input(s): FREET3  Other results:   Imaging    DG Chest Port 1 View  Result Date: 04/10/2019 CLINICAL DATA:  Tracheostomy, LVAD, CABG EXAM: PORTABLE CHEST 1 VIEW COMPARISON:  Portable exam 0539 hours compared to 04/08/2019 FINDINGS: Tracheostomy tube projects over tracheal air column unchanged. Nasogastric tube extends into abdomen. RIGHT arm PICC line tip projects over cavoatrial junction. Impella device projects over cardiac silhouette unchanged. Dual lumen LEFT subclavian central venous catheter with tip projecting over SVC. Epicardial pacing wires noted. Normal heart size post CABG and Maze procedure. Mediastinal contours  normal. Atherosclerotic calcification aorta. Increased LEFT pleural effusion and basilar atelectasis versus consolidation. No pneumothorax. IMPRESSION: Postoperative changes with tubes and lines as above, grossly unchanged. Increased LEFT pleural effusion and basilar atelectasis. Electronically Signed   By: Lavonia Dana M.D.   On: 04/10/2019 09:06     Medications:     Scheduled Medications: . amiodarone  150 mg Intravenous Once  . aspirin  81 mg Per Tube Daily  . atorvastatin  80 mg Per Tube q1800  . B-complex with vitamin C  1 tablet Oral Daily  . chlorhexidine gluconate (MEDLINE KIT)  15 mL Mouth Rinse BID  . Chlorhexidine Gluconate Cloth  6 each Topical Daily  . darbepoetin (ARANESP) injection - NON-DIALYSIS  40 mcg Subcutaneous Q Tue-1800  . feeding supplement (PRO-STAT SUGAR FREE 64)  60 mL Per Tube BID  . Gerhardt's butt cream   Topical BID  . hydrocortisone   Rectal BID  . insulin aspart  0-24 Units Subcutaneous Q4H  . insulin aspart  5 Units Subcutaneous Q4H  . insulin glargine  26 Units Subcutaneous Daily  . mouth rinse  15 mL Mouth Rinse 10 times per day  . midodrine  10 mg Oral TID WC  . pantoprazole (PROTONIX) IV  40 mg Intravenous Q24H  . sodium chloride flush  10-40 mL Intracatheter Q12H  . tamsulosin  0.4 mg Oral Daily    Infusions: .  prismasol BGK 4/2.5 400 mL/hr at 04/09/19 1302  .  prismasol BGK 4/2.5 200 mL/hr at 04/09/19 1649  . sodium chloride    . sodium chloride Stopped (04/04/19 1736)  . sodium chloride 10 mL/hr at 04/10/19 0900  . amiodarone 30 mg/hr (04/10/19 0900)  . feeding supplement (VITAL 1.5 CAL) Stopped (04/10/19 0830)  . impella catheter heparin 25 unit/mL in dextrose 5%    . meropenem (MERREM) IV Stopped (04/10/19 2334)  . norepinephrine (LEVOPHED) Adult infusion 14 mcg/min (04/10/19 0933)  . prismasol BGK 4/2.5 1,500 mL/hr at 04/10/19 3568  . vasopressin (PITRESSIN) infusion - *FOR SHOCK* 0.02 Units/min (04/10/19 0900)    PRN  Medications: sodium chloride, oxyCODONE **AND** acetaminophen, dextrose, fentaNYL (SUBLIMAZE) injection, heparin, heparin, midazolam, ondansetron (ZOFRAN) IV, sodium chloride flush, sodium chloride flush    Assessment/Plan   1. Acute systolic HF -> Cardiogenic shock - post-op echo on 03/20/19 EF 25-30% (pre-op 30-35%. In 12/2017 EF normal) - Remains on Impella support P-4. Flows and waveforms reviewed personally Co-ox stable. Will wean to P-3 and plan to remove in OR later today if tolerates P-3. D/w Dr. Prescott Gum - Continue VP 0.02, NE 13. And midodrine 10 tid Will wean once back from OR - Continue CVVHD for volume removal. Keep even  for now with CVP 6. Hard to tell if making urine with CBI. May be ok to give him a break for a few days and follow BUN/Cr to see if he can clear.  - Family seems most interested in continuing with aggressive care though Mr. Tarnow at times seems to favor switching to comfort care though he has not voiced this definitively.  - For now, we will continue aggressive therapy. I think next step is to try to wean Impella today. I turned down to P-3 today and watched and he was stable. I will d/w Dr. Prescott Gum and hopefully we can get Impella out this afternoon. If stable off Impella the plan would be to then try and wean CVVHD and see if his kidneys will recover or if he will tolerate iHD. We will then need to wean inotropes.    2. CAD s/p CABG this admit (LIMA->LAD, SVG->OM, SVG->RCA on 03/15/19) - No angina - Continue ASA/ticagrelor/statin  3. Acute hypoxic respiratory failure with Pseudomonas PNA - s/p trach on 03/27/19 - on TC - on meropenem for pseudomonas PNA - d/w CCM at bedside  4. PAF - s/p MAZE and LAA occlusion - In/out of AF. Remains in NSR overnight  -continue IV amio 30 mg/hr.  - Off systemic heparin with hematuria and melena . Can likely restart soon  5. AKI  - due to shock/ATN - Creatinine peaked at 5.04 - Started [facility] 03/29/19 per Nephrology.  -  CVP down to 6 - Hard to tell if making urine with CBI. May be ok to give him a break for a few days and follow BUN/Cr to see if he can clear.   6. Hematuria - LDH ok. Off heparin drip.  - Getting continuous bladder irrigation.   - Urology following.   7. Melena - No melena overnight.   8. Anemia -Hematuria/melena. Now slowed -Hgb 7.8 - Getting another unit of blood today  9. Debility, severe - continue PT  CRITICAL CARE Performed by: Glori Bickers  Total critical care time: 35 minutes  Critical care time was exclusive of separately billable procedures and treating other patients.  Critical care was necessary to treat or prevent imminent or life-threatening deterioration.  Critical care was time spent personally by me (independent of midlevel providers or residents) on the following activities: development of treatment plan with patient and/or surrogate as well as nursing, discussions with consultants, evaluation of patient's response to treatment, examination of patient, obtaining history from patient or surrogate, ordering and performing treatments and interventions, ordering and review of laboratory studies, ordering and review of radiographic studies, pulse oximetry and re-evaluation of patient's condition.   Glori Bickers, MD  10:08 AM

## 2019-04-10 NOTE — Progress Notes (Signed)
Bensimhon MD notified of levophed gtt at 16 (max ordered to 20). BP goal will be SBP > 100.

## 2019-04-10 NOTE — Progress Notes (Signed)
Chaplain provided spiritual support to Jesse Murphy and his son Jesse Murphy.  Chaplain will continue to follow-up.

## 2019-04-10 NOTE — Progress Notes (Signed)
NAME:  Jesse Murphy, MRN:  [id number], DOB:  22-Aug-1928, LOS: 39 ADMISSION DATE:  03/13/2019, CONSULTATION DATE:  2/2 REFERRING MD:  Nils Pyle, CHIEF COMPLAINT:  Dyspnea   Brief History   86 y/o male admitted 1/26, underwent CABG and L atrial appendage clipping on 1/29, moved to ICU on 2/1 for intubation.  Past Medical History  A fib, CAD, Prostate cancer, HTN  Significant Hospital Events   1/29 CABG 2/01 to ICU 2/05 Extubated 2/08 Reintubated 2/09 tracheostomy 2/09 Impella, PA catheter placed 2/11 CRRT initiated 2/16 leukocytosis, add vancomycin 2/17 start TC trials 2/19 urology placed 3 way foley, bleeding at rectal tube site 2/21 ATC initiated  2/22 1 unit PRBC 2/23 1 unit PRBC  Consults:  Nephrology CHF team Urology  Procedures:  Rt PICC 2/02 >> Trach 2/09 >> PA catheter 2/09 >> Lt Stanley HD cath 2/11 >> L Rad Aline 2/3 >>  Impella 2/10 >>  Significant Diagnostic Tests:  Echo 1/26 >> EF 30 to 35%  Micro Data:  SARS CoV2 PCR 1/26 >> negative Influenza PCR 1/26 >> negative Sputum 2/01 >> oral flora Sputum 2/08 >> Pseudomonas >> pan sensitive  Blood 2/16 >> negative Sputum 2/16 >> Pseudomonas sens to cipro, gent, imepenem BCx2 2/20 >>   Antimicrobials:  Rocephin 2/01 >> 2/07 Cefepime 2/08 >> 2/10 Fortaz 2/10 >> 2/16 Vancomycin 2/16 >> 2/19 Meropenem 2/20 >>  Interim history/subjective:  Tmax 99.7 Glucose range 112-192 Remains on ATC, down to 28% (from 40%) On CVVHD, filter clotted   CVTS planning to remove impella in OR today  Got 1 unit PRBC's this am for Hgb 7.8 SvO2 74% / LDH 242  Objective   Blood pressure 100/68, pulse 69, temperature 97.9 F (36.6 C), temperature source Oral, resp. rate 20, height 5\' 8"  (1.727 m), weight 87.7 kg, SpO2 95 %. CVP:  [1 mmHg-31 mmHg] 6 mmHg  FiO2 (%):  [28 %-40 %] 28 %   Intake/Output Summary (Last 24 hours) at 04/10/2019 0850 Last data filed at 04/10/2019 0800 Gross per 24 hour  Intake 10656.28 ml  Output  10532 ml  Net 124.28 ml   Filed Weights   04/06/19 1400  Weight: 87.7 kg    Examination: General:  Elderly male lying in bed in NAD HEENT: MM pink/moist, trach midline c/d/i  Neuro: Awake, alert, interacts appropriately, generalized weakness but moves all extremities CV: unable to hear heart tones due impella hum, irregular rhythm on monitor PULM:  Non-labored on ATC, lungs bilaterally clear anterior, diminished bases GI: soft, bsx4 active  Extremities: warm/dry, no edema  Skin: no rashes or lesions  Resolved Hospital Problem list     Assessment & Plan:   Acute Hypoxemic Respiratory Failure due to Acute pulmonary edema complicated by Pseudomonas HCAP. Left Pleural Effusion  -open ended trach collar as tolerated  -if new fatigue, rest on PRVC / PSV intermittently  -follow intermittnet CXR  -D3/7 abx -consider left thoracentesis / left chest [place] evaluation   Cardiogenic Shock with acute systolic heart failure. New onset atrial fibrillation >> back in sinus rhythm. -TCTS planning to remove impella 2/23 afternoon  -continue ASA, lipitor, mododrine, amiodarone  -s/p 1 unit PRBC -heparin gtt on hold in setting of hematuria, rectal bleeding (resolved, suspect related to hemorrhoids/flexi seal use)  AKI secondary to Cardiogenic Shock. -CVVHD on hold until returns from OR given filter clotting this am (2/23) -trend BMP  -follow electrolytes, replace as indicated   Anemia of critical illness. -s/p 1 unit PRBC  2/23, note filter clotted am 2/23. May need further transfusion  -trend CBC  -transfuse for Hgb <7% or significant bleeding   Rectal bleeding Noted 2/19 Suspect this is related to irritation from flexi-seal.  No further bleeding since removal  -follow clinically   Hematuria with retained clots -continuous bladder irrigation per Urology    DM2 with hyperglycemia, poorly controlled -SSI  -TF coverage 5 units Q4, hold while NPO -lantus 26 units QD -discussed D10 use  with RN if concerns of hypoglycemia   Acute metabolic encephalopathy due to shock, renal failure, hypoxemia -RASS goal 0  -delirium prevention measures  -promote sleep / wake cycle   Pressure wounds / Reported as Skin Tear 2/15 -WOC following, appreciate input   Leukocytosis  -abx as above   Best practice:  Diet: TF DVT prophylaxis: SCD's GI prophylaxis: PPI Mobility: bed rest Code Status: Full Code  Disposition: ICU  Labs    CMP Latest Ref Rng & Units 04/10/2019 04/09/2019 04/09/2019  Glucose 70 - 99 mg/dL 108(H) 153(H) 172(H)  BUN 8 - 23 mg/dL 31(H) 34(H) 36(H)  Creatinine 0.61 - 1.24 mg/dL 1.66(H) 1.77(H) 1.75(H)  Sodium 135 - 145 mmol/L 138 138 135  Potassium 3.5 - 5.1 mmol/L 4.8 4.8 4.7  Chloride 98 - 111 mmol/L 104 104 100  CO2 22 - 32 mmol/L 27 25 25   Calcium 8.9 - 10.3 mg/dL 7.8(L) 7.4(L) 7.6(L)  Total Protein 6.5 - 8.1 g/dL - - -  Total Bilirubin 0.3 - 1.2 mg/dL - - -  Alkaline Phos 38 - 126 U/L - - -  AST 15 - 41 U/L - - -  ALT 0 - 44 U/L - - -    CBC Latest Ref Rng & Units 04/10/2019 04/09/2019 04/09/2019  WBC 4.0 - 10.5 K/uL 10.2 10.8(H) 10.0  Hemoglobin 13.0 - 17.0 g/dL 7.8(L) 8.0(L) 7.0(L)  Hematocrit 39.0 - 52.0 % 24.8(L) 25.1(L) 22.5(L)  Platelets 150 - 400 K/uL 134(L) 113(L) 111(L)    ABG    Component Value Date/Time   PHART 7.488 (H) 04/07/2019 0405   PCO2ART 34.4 04/07/2019 0405   PO2ART 101.0 04/07/2019 0405   HCO3 25.9 04/07/2019 0405   TCO2 27 04/07/2019 0405   ACIDBASEDEF 1.0 04/05/2019 0540   O2SAT 63.7 04/10/2019 0345    CBG (last 3)  Recent Labs    04/10/19 0059 04/10/19 0341 04/10/19 [place], MSN, NP-C [facility] 04/10/2019, 8:50 AM   Please see [email or web address] for pager details.

## 2019-04-10 NOTE — Progress Notes (Signed)
14 Days Post-Op Procedure(s) (LRB): TRACHEOSTOMY placed using Shiley 8DCT Cuffed. (N/A) Placement Of Impella Left Ventricular Assist Device using ABIOMED Impella 5.5 with SmartAssist Device. (Right) Subjective: Stable night in nsr anxd trach collar BP stable on low level pressors 1 u PRBC in for Hb 7.8 on am labs Will turn Impella 5.5 down to P3 , observe , then remove later in OR Objective: Vital signs in last 24 hours: Temp:  [98 F (36.7 C)-99.7 F (37.6 C)] 98.2 F (36.8 C) (02/23 0540) Pulse Rate:  [27-121] 78 (02/23 0746) Cardiac Rhythm: Normal sinus rhythm (02/23 0400) Resp:  [12-33] 23 (02/23 0746) BP: (83-125)/(30-77) 92/47 (02/23 0746) SpO2:  [84 %-100 %] 99 % (02/23 0746) FiO2 (%):  [28 %-40 %] 28 % (02/23 0746)  Hemodynamic parameters for last 24 hours: CVP:  [1 mmHg-31 mmHg] 13 mmHg  Intake/Output from previous day: 02/22 0701 - 02/23 0700 In: 10536.7 [I.V.:1562.1; Blood:645; NG/GT:1550; IV Piggyback:472.8] Out: 10460 [Urine:7285] Intake/Output this shift: Total I/O In: 107.1 [I.V.:44.8; Other:12.3; NG/GT:50] Out: 421 [Urine:350; Other:71]       Exam    General- alert and comfortable on trach collar    Neck- no JVD, no cervical adenopathy palpable, no carotid bruit   Lungs- clear without rales, wheezes   Cor- regular rate and rhythm, no murmur , gallop   Abdomen- soft, non-tender   Extremities - warm, non-tender, minimal edema   Neuro- oriented, appropriate, no focal weakness   Lab Results: Recent Labs    04/09/19 1400 04/10/19 0345  WBC 10.8* 10.2  HGB 8.0* 7.8*  HCT 25.1* 24.8*  PLT 113* 134*   BMET:  Recent Labs    04/09/19 1505 04/10/19 0345  NA 138 138  K 4.8 4.8  CL 104 104  CO2 25 27  GLUCOSE 153* 108*  BUN 34* 31*  CREATININE 1.77* 1.66*  CALCIUM 7.4* 7.8*    PT/INR: No results for input(s): LABPROT, INR in the last 72 hours. ABG    Component Value Date/Time   PHART 7.488 (H) 04/07/2019 0405   HCO3 25.9 04/07/2019 0405   TCO2 27 04/07/2019 0405   ACIDBASEDEF 1.0 04/05/2019 0540   O2SAT 63.7 04/10/2019 0345   CBG (last 3)  Recent Labs    04/10/19 0059 04/10/19 0341 04/10/19 0759  GLUCAP 155* 112* 192*    Assessment/Plan: S/P Procedure(s) (LRB): TRACHEOSTOMY placed using Shiley 8DCT Cuffed. (N/A) Placement Of Impella Left Ventricular Assist Device using ABIOMED Impella 5.5 with SmartAssist Device. (Right) Plan removal of Impella in OR later- discussed with patient Stop heparin on call to OR  LOS: 27 days    Jesse Murphy 04/10/2019

## 2019-04-10 NOTE — Progress Notes (Signed)
Patient sent to OR. Report given to CRNA at bedside.

## 2019-04-11 ENCOUNTER — Inpatient Hospital Stay (HOSPITAL_COMMUNITY): Payer: Medicare Other

## 2019-04-11 ENCOUNTER — Encounter: Payer: Self-pay | Admitting: *Deleted

## 2019-04-11 ENCOUNTER — Other Ambulatory Visit (HOSPITAL_COMMUNITY): Payer: Medicare Other

## 2019-04-11 LAB — TYPE AND SCREEN
ABO/RH(D): B POS
Antibody Screen: NEGATIVE
Unit division: 0
Unit division: 0
Unit division: 0
Unit division: 0
Unit division: 0
Unit division: 0

## 2019-04-11 LAB — RENAL FUNCTION PANEL
Albumin: 2 g/dL — ABNORMAL LOW (ref 3.5–5.0)
Albumin: 2 g/dL — ABNORMAL LOW (ref 3.5–5.0)
Anion gap: 11 (ref 5–15)
Anion gap: 11 (ref 5–15)
BUN: 54 mg/dL — ABNORMAL HIGH (ref 8–23)
BUN: 38 mg/dL — ABNORMAL HIGH (ref 8–23)
CO2: 20 mmol/L — ABNORMAL LOW (ref 22–32)
CO2: 23 mmol/L (ref 22–32)
Calcium: 7.7 mg/dL — ABNORMAL LOW (ref 8.9–10.3)
Calcium: 7.5 mg/dL — ABNORMAL LOW (ref 8.9–10.3)
Chloride: 104 mmol/L (ref 98–111)
Chloride: 103 mmol/L (ref 98–111)
Creatinine, Ser: 3.25 mg/dL — ABNORMAL HIGH (ref 0.61–1.24)
Creatinine, Ser: 2.43 mg/dL — ABNORMAL HIGH (ref 0.61–1.24)
GFR calc Af Amer: 19 mL/min — ABNORMAL LOW (ref >60)
GFR calc Af Amer: 27 mL/min — ABNORMAL LOW (ref >60)
GFR calc non Af Amer: 16 mL/min — ABNORMAL LOW (ref >60)
GFR calc non Af Amer: 23 mL/min — ABNORMAL LOW (ref >60)
Glucose, Bld: 95 mg/dL (ref 70–99)
Glucose, Bld: 168 mg/dL — ABNORMAL HIGH (ref 70–99)
Phosphorus: 5.2 mg/dL — ABNORMAL HIGH (ref 2.5–4.6)
Phosphorus: 3.9 mg/dL (ref 2.5–4.6)
Potassium: 5.7 mmol/L — ABNORMAL HIGH (ref 3.5–5.1)
Potassium: 5 mmol/L (ref 3.5–5.1)
Sodium: 135 mmol/L (ref 135–145)
Sodium: 137 mmol/L (ref 135–145)

## 2019-04-11 LAB — GLUCOSE, CAPILLARY
Glucose-Capillary: 78 mg/dL (ref 70–99)
Glucose-Capillary: 82 mg/dL (ref 70–99)
Glucose-Capillary: 85 mg/dL (ref 70–99)
Glucose-Capillary: 91 mg/dL (ref 70–99)
Glucose-Capillary: 92 mg/dL (ref 70–99)
Glucose-Capillary: 95 mg/dL (ref 70–99)
Glucose-Capillary: 98 mg/dL (ref 70–99)

## 2019-04-11 LAB — BPAM RBC
Blood Product Expiration Date: 202102242359
Blood Product Expiration Date: 202102252359
Blood Product Expiration Date: 202103172359
Blood Product Expiration Date: 202103172359
Blood Product Expiration Date: 202103172359
Blood Product Expiration Date: 202103232359
ISSUE DATE / TIME: 202102180710
ISSUE DATE / TIME: 202102220731
ISSUE DATE / TIME: 202102230507
ISSUE DATE / TIME: 202102231716
ISSUE DATE / TIME: 202102231716
Unit Type and Rh: 1700
Unit Type and Rh: 1700
Unit Type and Rh: 1700
Unit Type and Rh: 5100
Unit Type and Rh: 7300
Unit Type and Rh: 9500

## 2019-04-11 LAB — CBC
HCT: 26.7 % — ABNORMAL LOW (ref 39.0–52.0)
Hemoglobin: 8.5 g/dL — ABNORMAL LOW (ref 13.0–17.0)
MCH: 29.4 pg (ref 26.0–34.0)
MCHC: 31.8 g/dL (ref 30.0–36.0)
MCV: 92.4 fL (ref 80.0–100.0)
Platelets: 143 10*3/uL — ABNORMAL LOW (ref 150–400)
RBC: 2.89 MIL/uL — ABNORMAL LOW (ref 4.22–5.81)
RDW: 16.1 % — ABNORMAL HIGH (ref 11.5–15.5)
WBC: 10.7 10*3/uL — ABNORMAL HIGH (ref 4.0–10.5)
nRBC: 0 % (ref 0.0–0.2)

## 2019-04-11 LAB — MAGNESIUM: Magnesium: 2.4 mg/dL (ref 1.7–2.4)

## 2019-04-11 LAB — ECHO INTRAOPERATIVE TEE
Height: 68 in
Weight: 3065.28 oz

## 2019-04-11 LAB — COOXEMETRY PANEL
Carboxyhemoglobin: 1.7 % — ABNORMAL HIGH (ref 0.5–1.5)
Methemoglobin: 1.2 % (ref 0.0–1.5)
O2 Saturation: 62.3 %
Total hemoglobin: 9 g/dL — ABNORMAL LOW (ref 12.0–16.0)

## 2019-04-11 LAB — LACTATE DEHYDROGENASE: LDH: 186 U/L (ref 98–192)

## 2019-04-11 MED ORDER — PRISMASOL BGK 4/2.5 32-4-2.5 MEQ/L REPLACEMENT SOLN: Status: DC

## 2019-04-11 MED ORDER — HEPARIN SODIUM (PORCINE) 1000 UNIT/ML IJ SOLN
1000.0000 [IU] | INTRAMUSCULAR | Status: DC | PRN
  Administered 2019-04-11: 17:00:00 4000 [IU]
  Filled 2019-04-11 (×2): qty 6

## 2019-04-11 MED ORDER — FENTANYL CITRATE (PF) 100 MCG/2ML IJ SOLN
25.0000 ug | INTRAMUSCULAR | Status: DC | PRN
  Filled 2019-04-11: qty 2

## 2019-04-11 MED ORDER — ATROPINE SULFATE 1 MG/10ML IJ SOSY
PREFILLED_SYRINGE | INTRAMUSCULAR | Status: AC
  Filled 2019-04-11: qty 10

## 2019-04-11 MED ORDER — PRISMASOL BGK 4/2.5 32-4-2.5 MEQ/L IV SOLN: INTRAVENOUS | Status: DC

## 2019-04-11 MED ORDER — HEPARIN SODIUM (PORCINE) 1000 UNIT/ML DIALYSIS: 1000.0000 [IU] | INTRAMUSCULAR | Status: DC | PRN

## 2019-04-11 MED ORDER — HEPARIN SODIUM (PORCINE) 1000 UNIT/ML DIALYSIS
1000.0000 [IU] | INTRAMUSCULAR | Status: DC | PRN
  Filled 2019-04-11: qty 6

## 2019-04-11 MED ORDER — SODIUM ZIRCONIUM CYCLOSILICATE 10 G PO PACK
10.0000 g | PACK | Freq: Once | ORAL | Status: AC
  Administered 2019-04-11: 10 g via ORAL
  Filled 2019-04-11: qty 1

## 2019-04-11 MED ORDER — INSULIN GLARGINE 100 UNIT/ML ~~LOC~~ SOLN
26.0000 [IU] | Freq: Every day | SUBCUTANEOUS | Status: DC
  Administered 2019-04-12 – 2019-04-16 (×5): 26 [IU] via SUBCUTANEOUS
  Filled 2019-04-11 (×6): qty 0.26

## 2019-04-11 NOTE — Progress Notes (Signed)
Pharmacy Antibiotic Note  Jesse Murphy is a 86 y.o. male  with pseudomonal PNA on meropenem day 4  (recenlty completed a course of fortaz for pseudomonas on 2/16) .   Restarted CRRT on 2/24 AM. WBC stable at 10.7, afebrile. Scr 3.25. Day #5 of 7.   Plan: -Meropenem 1gm IV q8h (stop after last dose on 2/26) -Will follow renal function, cultures and clinical progress   Height: 5\' 8"  (172.7 cm) Weight: 191 lb 9.3 oz (86.9 kg) IBW/kg (Calculated) : 68.4  Temp (24hrs), Avg:98.2 F (36.8 C), Min:97.5 F (36.4 C), Max:98.9 F (37.2 C)  Recent Labs  Lab 04/04/19 1457 04/05/19 0505 04/09/19 0338 04/09/19 1400 04/09/19 1505 04/10/19 0345 04/10/19 1005 04/10/19 1512 04/11/19 0415  WBC 17.8*   < > 10.0 10.8*  --  10.2 11.6*  --  10.7*  CREATININE 1.76*   < > 1.75*  --  1.77* 1.66*  --  2.25* 3.25*  VANCORANDOM 15  --   --   --   --   --   --   --   --    < > = values in this interval not displayed.    Estimated Creatinine Clearance: 16.8 mL/min (A) (by C-G formula based on SCr of 3.25 mg/dL (H)).    No Known Allergies  Antimicrobials this admission: CTX 2/1 >> 2/7 Cefepime 2/8 >> 2/10 Fortaz 2/10 >> 2/16 Vanc 2/16>>2/19 Meropenem 2/20>>   Microbiology results: 2/20 blood- ngtd 2/20 respiratory cx-  Pseudomonas (intermediate sensitivity to fortaz) 2/16 blood x 2: neg 2/16 TA: pseudomonas, few c.tropicalis 2/8 resp cx - Pseudomonas- R - cefepime (not included on report, S fortaz,cipro, gent, primaxin) 2/1 TA: normal flora 1/28 MRSA: neg 1/26 COVID/flu: neg  Thank you for allowing pharmacy to be a part of this patient's care.  Antonietta Jewel, PharmD, [place] Clinical Pharmacist  Phone: [phone]  Please check AMION for all [place] phone numbers After 10:00 PM, call [place] [phone]

## 2019-04-11 NOTE — Progress Notes (Signed)
NAME:  Jesse Murphy, MRN:  [id number], DOB:  [birth year], LOS: 70 ADMISSION DATE:  03/13/2019, CONSULTATION DATE:  2/2 REFERRING MD:  Nils Pyle, CHIEF COMPLAINT:  Dyspnea   Brief History   86 y/o male admitted 1/26, underwent CABG and L atrial appendage clipping on 1/29, moved to ICU on 2/1 for intubation.  Past Medical History  A fib, CAD, Prostate cancer, HTN  Significant Hospital Events   1/29 CABG 2/01 to ICU 2/05 Extubated 2/08 Reintubated 2/09 tracheostomy 2/09 Impella, PA catheter placed 2/11 CRRT initiated 2/16 leukocytosis, add vancomycin 2/17 start TC trials 2/19 urology placed 3 way foley, bleeding at rectal tube site 2/21 ATC initiated  2/22 1 unit PRBC 2/23 1 unit PRBC  Consults:  Nephrology CHF team Urology  Procedures:  Rt PICC 2/02 >> Trach 2/09 >> PA catheter 2/09 >> Lt Bailey HD cath 2/11 >> L Rad Aline 2/3 >>  Impella 2/10 >>  Significant Diagnostic Tests:  Echo 1/26 >> EF 30 to 35%  Micro Data:  SARS CoV2 PCR 1/26 >> negative Influenza PCR 1/26 >> negative Sputum 2/01 >> oral flora Sputum 2/08 >> Pseudomonas >> pan sensitive  Blood 2/16 >> negative Sputum 2/16 >> Pseudomonas sens to cipro, gent, imepenem BCx2 2/20 >>   Antimicrobials:  Rocephin 2/01 >> 2/07 Cefepime 2/08 >> 2/10 Fortaz 2/10 >> 2/16 Vancomycin 2/16 >> 2/19 Meropenem 2/20 >>  Interim history/subjective:  Remains on trach collar. Denies pain. No further clots per CBI  Objective   Blood pressure (!) 92/39, pulse 70, temperature (!) 97.4 F (36.3 C), temperature source Oral, resp. rate (!) 22, height 5\' 8"  (1.727 m), weight 86.9 kg, SpO2 100 %. CVP:  [0 mmHg-10 mmHg] 6 mmHg  Vent Mode: PRVC FiO2 (%):  [28 %-40 %] 28 % Set Rate:  [18 bmp] 18 bmp Vt Set:  [540 mL] 540 mL PEEP:  [5 cmH20] 5 cmH20 Plateau Pressure:  [9 cmH20] 9 cmH20   Intake/Output Summary (Last 24 hours) at 04/11/2019 1336 Last data filed at 04/11/2019 1300 Gross per 24 hour  Intake 34237.09 ml    Output 25515 ml  Net 8722.09 ml   Filed Weights   04/11/19 0500  Weight: 86.9 kg    Examination: General:  Elderly male lying in bed in NAD HEENT: MM pink/moist, trach midline c/d/i  Neuro: Awake, alert, interacts appropriately, generalized weakness but moves all extremities CV: unable to hear heart tones due impella hum, irregular rhythm on monitor PULM:  Non-labored on ATC, lungs bilaterally clear anterior, diminished bases GI: soft, bsx4 active  Extremities: warm/dry, improved edema. Skin: no rashes or lesions  Resolved Hospital Problem list     Assessment & Plan:   Acute Hypoxemic Respiratory Failure due to Acute pulmonary edema complicated by Pseudomonas HCAP. Small Left Pleural Post-pericardiotomy Effusion  -open ended trach collar as tolerated  -if new fatigue, rest on PRVC / PSV intermittently  -follow intermittnet CXR  -D3/7 abx  Cardiogenic Shock with acute systolic heart failure. New onset atrial fibrillation >> back in sinus rhythm. -Tolerated Impella removal. - Wean vasopressin and NE to off -continue ASA, lipitor, mododrine, amiodarone  -s/p 1 unit PRBC -heparin gtt on hold in setting of hematuria, rectal bleeding (resolved, suspect related to hemorrhoids/flexi seal use)  AKI secondary to Cardiogenic Shock. -CVVHD on at -30 per hour. - Trial of diuresis if filter goes dow.  Anemia of critical illness. -s/p 1 unit PRBC 2/23, note filter clotted am 2/23. May need further transfusion  -  trend CBC  -transfuse for Hgb <7% or significant bleeding   Rectal bleeding Noted 2/19 Suspect this is related to irritation from flexi-seal.  No further bleeding since removal  -follow clinically   Hematuria with retained clots -Stop CBI now that clots gone.   DM2 with hyperglycemia, poorly controlled -SSI  -TF coverage 5 units Q4, hold while NPO -lantus 26 units QD -discussed D10 use with RN if concerns of hypoglycemia   Pressure wounds / Reported as Skin Tear  2/15 -WOC following, appreciate input    Best practice:  Diet: TF DVT prophylaxis: SCD's GI prophylaxis: PPI Mobility: bed rest Code Status: Full Code  Disposition: ICU  Labs    CMP Latest Ref Rng & Units 04/11/2019 04/10/2019 04/10/2019  Glucose 70 - 99 mg/dL 95 167(H) 108(H)  BUN 8 - 23 mg/dL 54(H) 40(H) 31(H)  Creatinine 0.61 - 1.24 mg/dL 3.25(H) 2.25(H) 1.66(H)  Sodium 135 - 145 mmol/L 135 134(L) 138  Potassium 3.5 - 5.1 mmol/L 5.7(H) 5.0 4.8  Chloride 98 - 111 mmol/L 104 102 104  CO2 22 - 32 mmol/L 20(L) 22 27  Calcium 8.9 - 10.3 mg/dL 7.7(L) 7.3(L) 7.8(L)  Total Protein 6.5 - 8.1 g/dL - - -  Total Bilirubin 0.3 - 1.2 mg/dL - - -  Alkaline Phos 38 - 126 U/L - - -  AST 15 - 41 U/L - - -  ALT 0 - 44 U/L - - -    CBC Latest Ref Rng & Units 04/11/2019 04/10/2019 04/10/2019  WBC 4.0 - 10.5 K/uL 10.7(H) 11.6(H) 10.2  Hemoglobin 13.0 - 17.0 g/dL 8.5(L) 8.5(L) 7.8(L)  Hematocrit 39.0 - 52.0 % 26.7(L) 26.6(L) 24.8(L)  Platelets 150 - 400 K/uL 143(L) 136(L) 134(L)    ABG    Component Value Date/Time   PHART 7.488 (H) 04/07/2019 0405   PCO2ART 34.4 04/07/2019 0405   PO2ART 101.0 04/07/2019 0405   HCO3 25.9 04/07/2019 0405   TCO2 27 04/07/2019 0405   ACIDBASEDEF 1.0 04/05/2019 0540   O2SAT 62.3 04/11/2019 0430    CBG (last 3)  Recent Labs    04/11/19 0419 04/11/19 0853 04/11/19 1111  GLUCAP 78 92 95    CRITICAL CARE Performed by: Kipp Brood   Total critical care time: 40 minutes  Critical care time was exclusive of separately billable procedures and treating other patients.  Critical care was necessary to treat or prevent imminent or life-threatening deterioration.  Critical care was time spent personally by me on the following activities: development of treatment plan with patient and/or surrogate as well as nursing, discussions with consultants, evaluation of patient's response to treatment, examination of patient, obtaining history from patient or  surrogate, ordering and performing treatments and interventions, ordering and review of laboratory studies, ordering and review of radiographic studies, pulse oximetry, re-evaluation of patient's condition and participation in multidisciplinary rounds.  Kipp Brood, MD [facility] ICU Physician [place]  Pager: [phone] Mobile: [phone] After hours: [phone].

## 2019-04-11 NOTE — Op Note (Signed)
NAME: Jesse Murphy, Jesse Murphy. MEDICAL RECORD NL:[id number] ACCOUNT [id number] DATE OF BIRTH:08/26/1928 FACILITY: [facility] LOCATION: [facility] PHYSICIAN:Oluwaseyi Tull VAN TRIGT III, MD  OPERATIVE REPORT  DATE OF PROCEDURE:  04/10/2019  OPERATION: 1.  Removal of Impella 5.5 catheter from the right subclavian artery. 2.  Placement of femoral arterial line.  SURGEON:  Ivin Poot, MD  ANESTHESIA:  General.  PREOPERATIVE DIAGNOSES:   1.  History of urgent coronary artery bypass graft combined with maze procedure for ischemic cardiomyopathy, rapid atrial fibrillation and severe left main and 3-vessel coronary artery disease.  2.  Impella 5.5 catheter placed postoperatively for hemodynamic support, which was then weaned and felt to be ready for removal by the patient's cardiologist of the advanced heart failure service.  Informed consent was obtained from the patient and  family.  DESCRIPTION OF PROCEDURE:  The patient was brought from the ICU to the operating room in stable condition.  The patient was breathing spontaneously through the tracheostomy.  He was in sinus rhythm with stable vital signs.  He was placed supine on the  operating table and general anesthesia was induced.  A transesophageal echo probe was placed, which showed the ejection fraction to be 40% without significant valvular disease.  The chest was prepped and draped as a sterile field.  A proper time-out was  performed.  Prior to prepping the chest, a right femoral A-line was placed because arterial monitoring could not be established in the upper extremities by the anesthesia team.  This was done with a Seldinger technique, sterile technique, and the line was flushed  and a pressure tubing passed to the anesthesia side.  After the proper out, the previous incision beneath the right clavicle was opened and the Hemashield graft anastomosed to the right axillary artery was identified.  The heavy silk ties around the graft at the insertion site  of the skin were removed.  The  speed of the Impella 5.5 was then gradually reduced to zero as the catheter was pulled back through the heart into the great vessels.  As I removed the Impella 5.5 catheter out of the graft, the graft was clamped with a vascular clamp.  There was some  thrombus which was expelled with a flush from the graft.  An Endo-GIA stapling device was then used to staple and divide the graft approximately 2 cm above  the anastomosis.  The staple line was inspected and found to be hemostatic.  The wound was then irrigated and hemostasis was achieved.  The pectoralis fascia was closed with a #1 Vicryl.  The subcutaneous layer was closed with a running #1 Vicryl and the skin was closed with staples.  Through the tunnel site, a 12-French round JP drain was placed in the bed of the wound, secured to the skin and connected to a suction bulb drainage device.  Sterile dressing was applied and the patient was returned to the ICU.  He was assisted with bag  ventilation from the anesthesia team during the transport and then was connected to the ventilator onto his tracheostomy appliance in the ICU.  VN/NUANCE  D:04/11/2019 T:04/11/2019 JOB:[id number]

## 2019-04-11 NOTE — Progress Notes (Signed)
CRRT stopped due to Extremely high TMP d/t clotting. MD notified. Pt clamped and disconnected from circuit. No blood returned from circuit at this time d/t possible clots. Will continue to monitor and assess.

## 2019-04-11 NOTE — Progress Notes (Signed)
Dr Royce Macadamia, nephrology, at bedside. Pt condition and plan discussed. Restarting CRRT this am.

## 2019-04-11 NOTE — Progress Notes (Signed)
Nephrology Progress Note:  Patient ID: Jesse Murphy, male   DOB: [birth year], 86 y.o.   MRN: [id number]   S: Impella removed on 2/23.  UOP is inaccurate given bladder irrigation.  He had 184 mL UF with CRRT over 2/23 - was turned off after clotting and in anticipation of OR on chart review and in discussion with CHF.  On levo at 2 and vaso at 0.01.  CVP 8-10 per nursing.   Review of systems:   Denies air hunger  Denies n/v Unable to quantify urine - bladder irrigation   O:BP (!) 108/43   Pulse 76   Temp 98.2 F (36.8 C) (Oral)   Resp (!) 21   Ht '5\' 8"'  (1.727 m)   Wt 87.7 kg   SpO2 98%   BMI 29.40 kg/m   Intake/Output Summary (Last 24 hours) at 04/11/2019 0518 Last data filed at 04/11/2019 0500 Gross per 24 hour  Intake 32382.04 ml  Output 26923 ml  Net 5459.04 ml   Intake/Output: I/O last 3 completed shifts: In: 23971.8 [I.V.:2033.7; Blood:1275; WCHEN:27782.4; MP/NT:6144; IV Piggyback:472.8] Out: 31540 [GQQPY:19509; TOIZT:2458]  Intake/Output this shift:  Total I/O In: 18354.6 [I.V.:354.6; Other:18000] Out: 09983 [Urine:13100; Drains:17] Weight change:    Physical exam:    Gen: elderly male in bed chronically ill-appearing HEENT trach in place; on vent  CVS: S1 S2 no rub Resp: clearer breath sounds; unlabored at rest  Abd: soft/ NT/ND  Ext: 1+ lower extremity edema Neuro - awake and interactive and follows commands, nods Psych - no anxiety or agitation  Access: left chest dialysis catheter   Recent Labs  Lab 04/08/19 0323 04/08/19 1543 04/09/19 0338 04/09/19 1505 04/10/19 0345 04/10/19 1512 04/11/19 0415  NA 132* 136 135 138 138 134* 135  K 4.4 4.8 4.7 4.8 4.8 5.0 5.7*  CL 101 102 100 104 104 102 104  CO2 '24 25 25 25 27 22 ' 20*  GLUCOSE 198* 170* 172* 153* 108* 167* 95  BUN 36* 35* 36* 34* 31* 40* 54*  CREATININE 1.79* 1.75* 1.75* 1.77* 1.66* 2.25* 3.25*  ALBUMIN 2.3* 2.3* 2.4* 2.2* 2.2* 2.2* 2.0*  CALCIUM 7.2* 7.6* 7.6* 7.4* 7.8* 7.3* 7.7*  PHOS 2.5  2.6 2.6 3.2 2.6 3.1 5.2*   Liver Function Tests: Recent Labs  Lab 04/10/19 0345 04/10/19 1512 04/11/19 0415  ALBUMIN 2.2* 2.2* 2.0*    CBC: Recent Labs  Lab 04/06/19 1233 04/07/19 0405 04/08/19 0323 04/08/19 0323 04/09/19 0338 04/09/19 0338 04/09/19 1400 04/10/19 0345 04/10/19 1005  WBC 16.6*   < > 11.5*   < > 10.0   < > 10.8* 10.2 11.6*  NEUTROABS 13.7*  --   --   --   --   --   --   --   --   HGB 8.4*   < > 7.8*   < > 7.0*   < > 8.0* 7.8* 8.5*  HCT 26.0*   < > 24.8*   < > 22.5*   < > 25.1* 24.8* 26.6*  MCV 91.5   < > 92.9  --  94.5  --  93.0 93.6 93.3  PLT 74*   < > 84*   < > 111*   < > 113* 134* 136*   < > = values in this interval not displayed.   Cardiac Enzymes: No results for input(s): CKTOTAL, CKMB, CKMBINDEX, TROPONINI in the last 168 hours. CBG: Recent Labs  Lab 04/10/19 1142 04/10/19 1512 04/10/19 2003 04/11/19 0008 04/11/19 0419  GLUCAP 142* 163* 84 82 78   IStudies/Results: DG Chest Port 1 View  Result Date: 04/10/2019 CLINICAL DATA:  Tracheostomy, LVAD, CABG EXAM: PORTABLE CHEST 1 VIEW COMPARISON:  Portable exam 0539 hours compared to 04/08/2019 FINDINGS: Tracheostomy tube projects over tracheal air column unchanged. Nasogastric tube extends into abdomen. RIGHT arm PICC line tip projects over cavoatrial junction. Impella device projects over cardiac silhouette unchanged. Dual lumen LEFT subclavian central venous catheter with tip projecting over SVC. Epicardial pacing wires noted. Normal heart size post CABG and Maze procedure. Mediastinal contours normal. Atherosclerotic calcification aorta. Increased LEFT pleural effusion and basilar atelectasis versus consolidation. No pneumothorax. IMPRESSION: Postoperative changes with tubes and lines as above, grossly unchanged. Increased LEFT pleural effusion and basilar atelectasis. Electronically Signed   By: Lavonia Dana M.D.   On: 04/10/2019 09:06   . amiodarone  150 mg Intravenous Once  . aspirin  81 mg Per  Tube Daily  . atorvastatin  80 mg Per Tube q1800  . B-complex with vitamin C  1 tablet Oral Daily  . chlorhexidine gluconate (MEDLINE KIT)  15 mL Mouth Rinse BID  . Chlorhexidine Gluconate Cloth  6 each Topical Daily  . darbepoetin (ARANESP) injection - NON-DIALYSIS  40 mcg Subcutaneous Q Tue-1800  . feeding supplement (PRO-STAT SUGAR FREE 64)  60 mL Per Tube BID  . Gerhardt's butt cream   Topical BID  . hydrocortisone   Rectal BID  . insulin aspart  0-24 Units Subcutaneous Q4H  . insulin aspart  5 Units Subcutaneous Q4H  . insulin glargine  26 Units Subcutaneous Daily  . mouth rinse  15 mL Mouth Rinse 10 times per day  . midodrine  10 mg Oral TID WC  . pantoprazole (PROTONIX) IV  40 mg Intravenous Q24H  . sodium chloride flush  10-40 mL Intracatheter Q12H  . tamsulosin  0.4 mg Oral Daily    BMET    Component Value Date/Time   NA 135 04/11/2019 0415   K 5.7 (H) 04/11/2019 0415   CL 104 04/11/2019 0415   CO2 20 (L) 04/11/2019 0415   GLUCOSE 95 04/11/2019 0415   BUN 54 (H) 04/11/2019 0415   CREATININE 3.25 (H) 04/11/2019 0415   CALCIUM 7.7 (L) 04/11/2019 0415   GFRNONAA 16 (L) 04/11/2019 0415   GFRAA 19 (L) 04/11/2019 0415   CBC    Component Value Date/Time   WBC 11.6 (H) 04/10/2019 1005   RBC 2.85 (L) 04/10/2019 1005   HGB 8.5 (L) 04/10/2019 1005   HCT 26.6 (L) 04/10/2019 1005   PLT 136 (L) 04/10/2019 1005   MCV 93.3 04/10/2019 1005   MCH 29.8 04/10/2019 1005   MCHC 32.0 04/10/2019 1005   RDW 16.2 (H) 04/10/2019 1005   LYMPHSABS 0.5 (L) 04/06/2019 1233   MONOABS 1.9 (H) 04/06/2019 1233   EOSABS 0.2 04/06/2019 1233   BASOSABS 0.0 04/06/2019 1233    Assessment/Plan:  1. AKI(Cr was 0.7 prior to surgery)- due to postoperative CHF/cardiogenic shock that was refractory to diuretics and started on CRRT on 03/29/19 for volume removal.  CRRT off on 2/23 - discussion with advanced CHF.   1. Given interim rise in K and continued pressor requirement, uncertain output  recommend to restart CRRT .  Goal UF of keep even for now and advance to net neg 50 an hour as tolerated.  Defer heparin with hx of GI bleed.    2. Per advanced CHF goal CVP of 5-8 while not having to increase pressors 3. Offering  CRRT as long as aligns with goals of care and as long as able to tolerate; goals of care discussions ongoing  2. Acute systolic CHF/cardiogenic shock- EF 25-30%; s/p Impella. optimize volume as above  3. CAD s/p CABG on 8/68/25 complicated by cardiogenic shock and ARF. 4. Acute hypoxic respiratory failure with pseudomonas pneumonia- s/p trach on 03/27/19. abx per PCCM 5. PAF- s/p MAZE and LAA occlusion- therapy per CHF team 6. Hematuria- bladder irrigation 7. ABLA- Hx Melena. Transfuse as needed. PRBC's on 2/22 and 2/23.  8. Protein malnutrition- moderate to severe 9. Hypophosphatemia- replete as needed 10. Vascular access- left [place] HD catheter placed 03/29/19 11. Disposition- He is not a longterm dialysis candidate given his cardiogenic shock and trach.  Concern that he would poorly tolerate iHD  Claudia Desanctis, MD 04/11/2019 5:33 AM

## 2019-04-11 NOTE — Progress Notes (Signed)
RT placed pt on ATC 28% this AM, pt tolerating well at this time with saturations of 99%. RT will continue to monitor pt status. Vent on stand by at pt bedside.

## 2019-04-11 NOTE — Progress Notes (Addendum)
Patient ID: Jesse Murphy, male   DOB: 1928/03/12, 86 y.o.   MRN: [id number]    Advanced Heart Failure Rounding Note  PCP-Cardiologist: Jesse Sable, MD   Subjective:    Impella removed 2/23. Intraoperative TEE showed EF ~40%  Remains on trach collar. FiO2 40%. PEEP 5  Off VP. On NE 2 mcg. Co-ox 62%.   Still requiring CVVHD. CVP 8-9   Objective:   Weight Range: 86.9 kg Body mass index is 29.13 kg/m.   Vital Signs:   Temp:  [97.5 F (36.4 C)-98.9 F (37.2 C)] 97.5 F (36.4 C) (02/24 0800) Pulse Rate:  [66-83] 72 (02/24 1000) Resp:  [14-32] 28 (02/24 1000) BP: (88-120)/(37-82) 100/37 (02/24 1000) SpO2:  [90 %-100 %] 100 % (02/24 1000) Arterial Line BP: (101-133)/(27-43) 114/34 (02/24 1000) FiO2 (%):  [28 %-40 %] 28 % (02/24 0730) Weight:  [86.9 kg] 86.9 kg (02/24 0500) Last BM Date: 04/11/19  Weight change: Filed Weights   04/11/19 0500  Weight: 86.9 kg    Intake/Output:   Intake/Output Summary (Last 24 hours) at 04/11/2019 1024 Last data filed at 04/11/2019 1000 Gross per 24 hour  Intake 34330 ml  Output 26582 ml  Net 7748 ml      Physical Exam   CVP 8-8 General:  Critically ill WM, on vent through TC. Awake and no distress  HEENT: normal + cor-trak Neck: supple. + TC Carotids 2+ bilat; no bruits. No lymphadenopathy or thryomegaly appreciated. Cor: PMI nondisplaced. RRR. Trialysis cath ok  Lungs: course BS bilaterally  Abdomen: soft, nontender, nondistended. No hepatosplenomegaly. No bruits or masses. Good bowel sounds. Extremities: no cyanosis, clubbing, rash, trace bilateral LEE edema + SCDs Neuro: alert follows commands  Telemetry   NSR 70-80s Personally reviewed  Labs    CBC Recent Labs    04/10/19 1005 04/11/19 0415  WBC 11.6* 10.7*  HGB 8.5* 8.5*  HCT 26.6* 26.7*  MCV 93.3 92.4  PLT 136* 532*   Basic Metabolic Panel Recent Labs    04/10/19 0345 04/10/19 0345 04/10/19 1512 04/11/19 0415  NA 138   < > 134* 135  K 4.8    < > 5.0 5.7*  CL 104   < > 102 104  CO2 27   < > 22 20*  GLUCOSE 108*   < > 167* 95  BUN 31*   < > 40* 54*  CREATININE 1.66*   < > 2.25* 3.25*  CALCIUM 7.8*   < > 7.3* 7.7*  MG 2.4  --   --  2.4  PHOS 2.6   < > 3.1 5.2*   < > = values in this interval not displayed.   Liver Function Tests Recent Labs    04/10/19 1512 04/11/19 0415  ALBUMIN 2.2* 2.0*   No results for input(s): LIPASE, AMYLASE in the last 72 hours. Cardiac Enzymes No results for input(s): CKTOTAL, CKMB, CKMBINDEX, TROPONINI in the last 72 hours.  BNP: BNP (last 3 results) Recent Labs    03/22/19 0401 03/26/19 0626 03/27/19 1255  BNP 340.4* 687.5* 800.4*    ProBNP (last 3 results) No results for input(s): PROBNP in the last 8760 hours.   D-Dimer No results for input(s): DDIMER in the last 72 hours. Hemoglobin A1C No results for input(s): HGBA1C in the last 72 hours. Fasting Lipid Panel No results for input(s): CHOL, HDL, LDLCALC, TRIG, CHOLHDL, LDLDIRECT in the last 72 hours. Thyroid Function Tests No results for input(s): TSH, T4TOTAL, T3FREE, THYROIDAB in the last  72 hours.  Invalid input(s): FREET3  Other results:   Imaging    DG CHEST PORT 1 VIEW  Result Date: 04/11/2019 CLINICAL DATA:  Respiratory failure EXAM: PORTABLE CHEST 1 VIEW COMPARISON:  04/10/2019 FINDINGS: Stable positioning of tracheostomy tube. Interval removal of enteric tube. Right-sided PICC line terminates at the level of the distal SVC. Impella device has been removed. Left subclavian central venous catheter stable terminating at the level of the SVC. Prior CABG and atrial appendage clip. Stable cardiomediastinal contours. Persistent opacity at the left lung base likely effusion and associated atelectasis. Right lung appears clear. No pneumothorax. IMPRESSION: 1. Interval removal of enteric tube and Impella device. 2. Persistent left basilar opacity likely representing effusion and associated atelectasis. Electronically Signed    By: Jesse Murphy D.O.   On: 04/11/2019 09:44     Medications:     Scheduled Medications: . amiodarone  150 mg Intravenous Once  . aspirin  81 mg Per Tube Daily  . atorvastatin  80 mg Per Tube q1800  . B-complex with vitamin C  1 tablet Oral Daily  . chlorhexidine gluconate (MEDLINE KIT)  15 mL Mouth Rinse BID  . Chlorhexidine Gluconate Cloth  6 each Topical Daily  . darbepoetin (ARANESP) injection - NON-DIALYSIS  40 mcg Subcutaneous Q Tue-1800  . feeding supplement (PRO-STAT SUGAR FREE 64)  60 mL Per Tube BID  . Gerhardt's butt cream   Topical BID  . hydrocortisone   Rectal BID  . insulin aspart  0-24 Units Subcutaneous Q4H  . insulin aspart  5 Units Subcutaneous Q4H  . [START ON 04/12/2019] insulin glargine  26 Units Subcutaneous Daily  . mouth rinse  15 mL Mouth Rinse 10 times per day  . midodrine  10 mg Oral TID WC  . pantoprazole (PROTONIX) IV  40 mg Intravenous Q24H  . sodium chloride flush  10-40 mL Intracatheter Q12H  . tamsulosin  0.4 mg Oral Daily    Infusions: .  prismasol BGK 4/2.5 400 mL/hr at 04/11/19 0756  .  prismasol BGK 4/2.5 300 mL/hr at 04/11/19 0756  . sodium chloride    . sodium chloride Stopped (04/04/19 1736)  . sodium chloride 10 mL/hr at 04/10/19 1900  . amiodarone 30 mg/hr (04/11/19 0522)  . feeding supplement (VITAL 1.5 CAL)    . meropenem (MERREM) IV 2 g (04/11/19 0601)  . norepinephrine (LEVOPHED) Adult infusion 2 mcg/min (04/11/19 0830)  . prismasol BGK 4/2.5 1,800 mL/hr at 04/11/19 0756    PRN Medications: sodium chloride, oxyCODONE **AND** acetaminophen, dextrose, fentaNYL (SUBLIMAZE) injection, midazolam, ondansetron (ZOFRAN) IV, sodium chloride flush, sodium chloride flush    Assessment/Plan   1. Acute systolic HF -> Cardiogenic shock - post-op echo on 03/20/19 EF 25-30% (pre-op 30-35%. In 12/2017 EF normal) -Required Impella support post CABG. Impella removed 2/23 - Off VP. No NE 2 mcg + midodrine 10 tid. Co-ox 62% - Continue  CVVHD for volume removal. Hard to tell if making urine with CBI. CBI has now been discontinued. Will monitor UOP.  - Family seems most interested in continuing with aggressive care though Jesse Murphy at times seems to favor switching to comfort care though he has not voiced this definitively.  - For now, we will continue aggressive therapy. So far, doing ok off impella. Next step is to try to wean off CVVHD and see if his kidneys will recover or if he will tolerate iHD.   2. CAD s/p CABG this admit (LIMA->LAD, SVG->OM, SVG->RCA on 03/15/19) - No  angina - Continue ASA +statin  3. Acute hypoxic respiratory failure with Pseudomonas PNA - s/p trach on 03/27/19 - on TC - on meropenem for pseudomonas PNA   4. PAF - s/p MAZE and LAA occlusion - In/out of AF. Remains in NSR overnight  -continue IV amio 30 mg/hr.  - Off systemic heparin with hematuria and melena . Can likely restart soon  5. AKI  - due to shock/ATN - Creatinine peaked at 5.04 - Started [facility] 03/29/19 per Nephrology.  - CVP down to 8-9 - Hard to tell if making urine with CBI. CBI now discontinued. Will monitor UOP.   6. Hematuria - LDH ok. Off heparin drip.  - Urine clearing. CBI discontinued    - Urology following.   7. Melena - No melena overnight.   8. Anemia -Hematuria/melena. Now slowed -Hgb trending back up, 8.5  9. Debility, severe - continue PT   Lyda Jester, PA-C  10:24 AM   Agree with above.  Impella 5.5 removed in OR yesterday. Was on vent overnight due to sedation but now back on TC. Awake and alert. Following commands. CVVHD restarted this am due to hyperkalemia. VP off. Remains on low-dose NE.  In NSR on IV amio   General:  Weak appearing. No resp difficulty HEENT: normal Neck: supple. On TC Carotids 2+ bilat; no bruits. No lymphadenopathy or thryomegaly appreciated. Cor: PMI nondisplaced. Regular rate & rhythm. No rubs, gallops or murmurs. Sternal wound ok. Impella site dressing ok  Lungs:  clear Abdomen: soft, nontender, nondistended. No hepatosplenomegaly. No bruits or masses. Good bowel sounds. Extremities: no cyanosis, clubbing, rash, edema + foley Neuro: alert follows commands  Impella now out. CO stable on low-dose NE. Back on CVVHD for hyperkalemia. Onces filter clots will try to keep him off for a few days and see what renal function and urine output does. Continue IV amio for AV and meropenum for PNA. Wean NE slowly. Need to get him out of bed. D/w Dr. Prescott Gum.   CRITICAL CARE Performed by: Glori Bickers  Total critical care time: 35 minutes  Critical care time was exclusive of separately billable procedures and treating other patients.  Critical care was necessary to treat or prevent imminent or life-threatening deterioration.  Critical care was time spent personally by me (independent of midlevel providers or residents) on the following activities: development of treatment plan with patient and/or surrogate as well as nursing, discussions with consultants, evaluation of patient's response to treatment, examination of patient, obtaining history from patient or surrogate, ordering and performing treatments and interventions, ordering and review of laboratory studies, ordering and review of radiographic studies, pulse oximetry and re-evaluation of patient's condition.  Glori Bickers, MD  5:10 PM

## 2019-04-11 NOTE — Anesthesia Postprocedure Evaluation (Signed)
Anesthesia Post Note  Patient: Jesse Murphy  Procedure(s) Performed: REMOVAL OF IMPELLA LEFT VENTRICULAR ASSIST DEVICE WITH INSERTION OF RIGHT FEMORAL ARTERIAL LINE (N/A Chest)     Patient location during evaluation: SICU Anesthesia Type: General Level of consciousness: sedated Pain management: pain level controlled Vital Signs Assessment: post-procedure vital signs reviewed and stable Respiratory status: patient connected to tracheostomy mask oxygen Cardiovascular status: stable Postop Assessment: no apparent nausea or vomiting Anesthetic complications: no    Last Vitals:  Vitals:   04/11/19 1949 04/11/19 2022  BP:    Pulse:  79  Resp:  (!) 22  Temp: 36.6 C   SpO2:  98%    Last Pain:  Vitals:   04/11/19 1949  TempSrc: Oral  PainSc:                  [name]

## 2019-04-11 NOTE — Progress Notes (Signed)
1 Day Post-Op Procedure(s) (LRB): REMOVAL OF IMPELLA LEFT VENTRICULAR ASSIST DEVICE WITH INSERTION OF RIGHT FEMORAL ARTERIAL LINE (N/A) Subjective: Patient doing well after removal of Impella yesterday-transesophageal echo demonstrating EF now closer to 40% Minimal discomfort at surgical site, JP drainage minimal Patient maintaining sinus rhythm and stable hemodynamics, norepinephrine down to 2 mcg Patient breathing comfortably on trach collar, pending swallow study with SLT Weight is euvolemic-CRT restarted this a.m. but will probably be transitioning off soon No evidence of GI bleeding, all heparin stopped  Objective: Vital signs in last 24 hours: Temp:  [97.5 F (36.4 C)-98.9 F (37.2 C)] 97.5 F (36.4 C) (02/24 0800) Pulse Rate:  [66-83] 71 (02/24 1100) Cardiac Rhythm: Atrial paced;Normal sinus rhythm (02/24 0800) Resp:  [14-32] 16 (02/24 1106) BP: (88-120)/(37-82) 113/57 (02/24 1106) SpO2:  [90 %-100 %] 99 % (02/24 1100) Arterial Line BP: (101-133)/(27-43) 130/39 (02/24 1100) FiO2 (%):  [28 %-40 %] 28 % (02/24 1106) Weight:  [86.9 kg] 86.9 kg (02/24 0500)  Hemodynamic parameters for last 24 hours: CVP:  [0 mmHg-10 mmHg] 0 mmHg  Intake/Output from previous day: 02/23 0701 - 02/24 0700 In: 34943.5 [I.V.:880; Blood:630; NG/GT:225; IV Piggyback:100] Out: 67672 [Urine:30925; Drains:17] Intake/Output this shift: Total I/O In: 129.1 [I.V.:129.1] Out: 125 [Other:125]      Physical Exam  General: Comfortable with trach collar responsive and appropriate HEENT: Normocephalic pupils equal , dentition adequate Neck: Supple without JVD, adenopathy, or bruit Chest: Clear to auscultation, symmetrical breath sounds, no rhonchi, no tenderness             or deformity sternal incision well-healed Cardiovascular: Regular rate and rhythm, no murmur, no gallop, peripheral pulses             palpable in all extremities Abdomen:  Soft, nontender, no palpable mass or  organomegaly Extremities: Warm, well-perfused, no clubbing cyanosis edema or tenderness,              no venous stasis changes of the legs Rectal/GU: Deferred Neuro: Grossly non--focal and symmetrical throughout Skin: Clean and dry without rash or ulceration   Lab Results: Recent Labs    04/10/19 1005 04/11/19 0415  WBC 11.6* 10.7*  HGB 8.5* 8.5*  HCT 26.6* 26.7*  PLT 136* 143*   BMET:  Recent Labs    04/10/19 1512 04/11/19 0415  NA 134* 135  K 5.0 5.7*  CL 102 104  CO2 22 20*  GLUCOSE 167* 95  BUN 40* 54*  CREATININE 2.25* 3.25*  CALCIUM 7.3* 7.7*    PT/INR: No results for input(s): LABPROT, INR in the last 72 hours. ABG    Component Value Date/Time   PHART 7.488 (H) 04/07/2019 0405   HCO3 25.9 04/07/2019 0405   TCO2 27 04/07/2019 0405   ACIDBASEDEF 1.0 04/05/2019 0540   O2SAT 62.3 04/11/2019 0430   CBG (last 3)  Recent Labs    04/11/19 0419 04/11/19 0853 04/11/19 1111  GLUCAP 78 92 95    Assessment/Plan: S/P Procedure(s) (LRB): REMOVAL OF IMPELLA LEFT VENTRICULAR ASSIST DEVICE WITH INSERTION OF RIGHT FEMORAL ARTERIAL LINE (N/A) Swallow study, consider reinsertion of core track feeding tube Hold heparin Continue IV amiodarone to maintain sinus rhythm Physical therapy may start mobility after femoral A-line removed   LOS: 28 days    Tharon Aquas Trigt III 04/11/2019

## 2019-04-11 NOTE — Progress Notes (Signed)
Tracheostomy Inner cannula changed. Pt tolerated well. No adverse event noted.

## 2019-04-11 NOTE — Progress Notes (Signed)
Physical Therapy Treatment Patient Details Name: Jesse Murphy MRN: [id number] DOB: [birth year] Today's Date: 04/11/2019    History of Present Illness 87 y.o. male with PMH: h/o CAD s/p CABG and clipping of left atrial appendage, who presented with SOB on 2/1. He became hypoxic on 2/2 with pulmonary edema and needed to be intubated on 2/2 and extubated 2/5. Pt underwent impella placement and tracheostomy on 2/9. Pt started on CRRT 2/11. Impella removal and tracheostomy 2/23.    PT Comments    Pt limited by orthostatic hypotension, however agreeable to and performing bed mobility this session. Pt mobilizes to edge of bed with significant physical assistance, however is able to maintain balance sitting with UE support well. Pt reports continued performance of HEP with assistance of son, PT encourages continued performance of HEP so that therapy can focus on functional mobility. Pt will benefit from continued bed mobility and attempts at transfer training next session if pt's BP is more stable.   Follow Up Recommendations  SNF;Supervision/Assistance - 24 hour     Equipment Recommendations  (defer to post-acute)    Recommendations for Other Services       Precautions / Restrictions Precautions Precautions: Fall;Sternal Restrictions Weight Bearing Restrictions: Yes Other Position/Activity Restrictions: sternal precautions    Mobility  Bed Mobility Overal bed mobility: Needs Assistance Bed Mobility: Supine to Sit;Sit to Supine     Supine to sit: Max assist Sit to supine: Max assist      Transfers                    Ambulation/Gait                 Stairs             Wheelchair Mobility    Modified Rankin (Stroke Patients Only)       Balance Overall balance assessment: Needs assistance Sitting-balance support: Bilateral upper extremity supported;Feet supported Sitting balance-Leahy Scale: Fair Sitting balance - Comments: minG-close supervision                                     Cognition Arousal/Alertness: Awake/alert Behavior During Therapy: Flat affect Overall Cognitive Status: Difficult to assess                                        Exercises General Exercises - Lower Extremity Ankle Circles/Pumps: AROM;Both;20 reps    General Comments General comments (skin integrity, edema, etc.): Pt on trach collar 5L, 28% FiO2. Pt with drop of BP from 103/37 to 80/56, reporting dizziness that does not improve LE exercise, returned to supine due to symptoms of orthostatic hypotension      Pertinent Vitals/Pain Pain Assessment: No/denies pain    Home Living                      Prior Function            PT Goals (current goals can now be found in the care plan section) Acute Rehab PT Goals Patient Stated Goal: home PT Goal Formulation: With patient Time For Goal Achievement: 04/25/19 Potential to Achieve Goals: Fair Progress towards PT goals: Progressing toward goals    Frequency    Min 2X/week      PT Plan Current plan remains appropriate  Co-evaluation              AM-PAC PT "6 Clicks" Mobility   Outcome Measure  Help needed turning from your back to your side while in a flat bed without using bedrails?: Total Help needed moving from lying on your back to sitting on the side of a flat bed without using bedrails?: Total Help needed moving to and from a bed to a chair (including a wheelchair)?: Total Help needed standing up from a chair using your arms (e.g., wheelchair or bedside chair)?: Total Help needed to walk in hospital room?: Total Help needed climbing 3-5 steps with a railing? : Total 6 Click Score: 6    End of Session Equipment Utilized During Treatment: Oxygen Activity Tolerance: Treatment limited secondary to medical complications (Comment);Other (comment)(limited by dizziness, orthostatic hypotension) Patient left: in bed;with call bell/phone  within reach;with nursing/sitter in room Nurse Communication: Mobility status PT Visit Diagnosis: Other abnormalities of gait and mobility (R26.89);Muscle weakness (generalized) (M62.81)     Time: 1121-6244 PT Time Calculation (min) (ACUTE ONLY): 18 min  Charges:  $Therapeutic Activity: 8-22 mins                     Zenaida Niece, PT, DPT Acute Rehabilitation Pager: [phone]    Zenaida Niece 04/11/2019, 5:42 PM

## 2019-04-12 ENCOUNTER — Inpatient Hospital Stay (HOSPITAL_COMMUNITY): Payer: Medicare Other

## 2019-04-12 LAB — GLUCOSE, CAPILLARY
Glucose-Capillary: 127 mg/dL — ABNORMAL HIGH (ref 70–99)
Glucose-Capillary: 148 mg/dL — ABNORMAL HIGH (ref 70–99)
Glucose-Capillary: 149 mg/dL — ABNORMAL HIGH (ref 70–99)
Glucose-Capillary: 89 mg/dL (ref 70–99)
Glucose-Capillary: 93 mg/dL (ref 70–99)
Glucose-Capillary: 97 mg/dL (ref 70–99)

## 2019-04-12 LAB — CBC
HCT: 24.1 % — ABNORMAL LOW (ref 39.0–52.0)
HCT: 29.2 % — ABNORMAL LOW (ref 39.0–52.0)
Hemoglobin: 7.5 g/dL — ABNORMAL LOW (ref 13.0–17.0)
Hemoglobin: 9.7 g/dL — ABNORMAL LOW (ref 13.0–17.0)
MCH: 29.4 pg (ref 26.0–34.0)
MCH: 30.2 pg (ref 26.0–34.0)
MCHC: 31.1 g/dL (ref 30.0–36.0)
MCHC: 33.2 g/dL (ref 30.0–36.0)
MCV: 94.5 fL (ref 80.0–100.0)
MCV: 91 fL (ref 80.0–100.0)
Platelets: 179 10*3/uL (ref 150–400)
Platelets: 217 10*3/uL (ref 150–400)
RBC: 2.55 MIL/uL — ABNORMAL LOW (ref 4.22–5.81)
RBC: 3.21 MIL/uL — ABNORMAL LOW (ref 4.22–5.81)
RDW: 16.5 % — ABNORMAL HIGH (ref 11.5–15.5)
RDW: 16.3 % — ABNORMAL HIGH (ref 11.5–15.5)
WBC: 8.8 10*3/uL (ref 4.0–10.5)
WBC: 11.9 10*3/uL — ABNORMAL HIGH (ref 4.0–10.5)
nRBC: 0 % (ref 0.0–0.2)
nRBC: 0 % (ref 0.0–0.2)

## 2019-04-12 LAB — MAGNESIUM: Magnesium: 2.4 mg/dL (ref 1.7–2.4)

## 2019-04-12 LAB — BASIC METABOLIC PANEL
Anion gap: 16 — ABNORMAL HIGH (ref 5–15)
BUN: 73 mg/dL — ABNORMAL HIGH (ref 8–23)
CO2: 20 mmol/L — ABNORMAL LOW (ref 22–32)
Calcium: 7.5 mg/dL — ABNORMAL LOW (ref 8.9–10.3)
Chloride: 99 mmol/L (ref 98–111)
Creatinine, Ser: 3.99 mg/dL — ABNORMAL HIGH (ref 0.61–1.24)
GFR calc Af Amer: 15 mL/min — ABNORMAL LOW (ref >60)
GFR calc non Af Amer: 13 mL/min — ABNORMAL LOW (ref >60)
Glucose, Bld: 201 mg/dL — ABNORMAL HIGH (ref 70–99)
Potassium: 4.7 mmol/L (ref 3.5–5.1)
Sodium: 135 mmol/L (ref 135–145)

## 2019-04-12 LAB — CULTURE, BLOOD (ROUTINE X 2)
Culture: NO GROWTH
Culture: NO GROWTH
Special Requests: ADEQUATE
Special Requests: ADEQUATE

## 2019-04-12 LAB — RENAL FUNCTION PANEL
Albumin: 1.9 g/dL — ABNORMAL LOW (ref 3.5–5.0)
Anion gap: 14 (ref 5–15)
BUN: 60 mg/dL — ABNORMAL HIGH (ref 8–23)
CO2: 22 mmol/L (ref 22–32)
Calcium: 7.6 mg/dL — ABNORMAL LOW (ref 8.9–10.3)
Chloride: 101 mmol/L (ref 98–111)
Creatinine, Ser: 3.49 mg/dL — ABNORMAL HIGH (ref 0.61–1.24)
GFR calc Af Amer: 17 mL/min — ABNORMAL LOW (ref >60)
GFR calc non Af Amer: 15 mL/min — ABNORMAL LOW (ref >60)
Glucose, Bld: 151 mg/dL — ABNORMAL HIGH (ref 70–99)
Phosphorus: 6 mg/dL — ABNORMAL HIGH (ref 2.5–4.6)
Potassium: 5.1 mmol/L (ref 3.5–5.1)
Sodium: 137 mmol/L (ref 135–145)

## 2019-04-12 LAB — COOXEMETRY PANEL
Carboxyhemoglobin: 1.7 % — ABNORMAL HIGH (ref 0.5–1.5)
Methemoglobin: 1.2 % (ref 0.0–1.5)
O2 Saturation: 64.6 %
Total hemoglobin: 9.9 g/dL — ABNORMAL LOW (ref 12.0–16.0)

## 2019-04-12 LAB — PREPARE RBC (CROSSMATCH)

## 2019-04-12 MED ORDER — SODIUM ZIRCONIUM CYCLOSILICATE 10 G PO PACK
10.0000 g | PACK | Freq: Once | ORAL | Status: AC
  Administered 2019-04-12: 10 g via ORAL
  Filled 2019-04-12: qty 1

## 2019-04-12 MED ORDER — NEPRO/CARBSTEADY PO LIQD
237.0000 mL | Freq: Three times a day (TID) | ORAL | Status: DC
  Administered 2019-04-12 – 2019-04-23 (×17): 237 mL via ORAL

## 2019-04-12 MED ORDER — SODIUM CHLORIDE 0.9% IV SOLUTION: Freq: Once | INTRAVENOUS | Status: DC

## 2019-04-12 MED ORDER — SODIUM CHLORIDE 0.9 % IV SOLN
500.0000 mg | INTRAVENOUS | Status: AC
  Administered 2019-04-13: 500 mg via INTRAVENOUS
  Filled 2019-04-12: qty 0.5

## 2019-04-12 MED ORDER — PRO-STAT SUGAR FREE PO LIQD
30.0000 mL | Freq: Two times a day (BID) | ORAL | Status: DC
  Administered 2019-04-12 – 2019-04-13 (×2): 30 mL via ORAL
  Filled 2019-04-12 (×5): qty 30

## 2019-04-12 MED ORDER — RESOURCE THICKENUP CLEAR PO POWD
ORAL | Status: DC | PRN
  Filled 2019-04-12 (×3): qty 125

## 2019-04-12 MED ORDER — CHLORHEXIDINE GLUCONATE CLOTH 2 % EX PADS
6.0000 | MEDICATED_PAD | Freq: Every day | CUTANEOUS | Status: DC
  Administered 2019-04-13 – 2019-04-14 (×2): 6 via TOPICAL

## 2019-04-12 MED ORDER — SEVELAMER CARBONATE 800 MG PO TABS
800.0000 mg | ORAL_TABLET | Freq: Three times a day (TID) | ORAL | Status: DC
  Administered 2019-04-12 – 2019-04-14 (×9): 800 mg via ORAL
  Filled 2019-04-12 (×9): qty 1

## 2019-04-12 MED ORDER — SODIUM CHLORIDE 0.9 % IV SOLN
1.0000 g | Freq: Two times a day (BID) | INTRAVENOUS | Status: DC
  Filled 2019-04-12: qty 1

## 2019-04-12 MED ORDER — NEPRO/CARBSTEADY PO LIQD: 1000.0000 mL | Freq: Three times a day (TID) | ORAL | Status: DC

## 2019-04-12 MED ORDER — RENA-VITE PO TABS
1.0000 | ORAL_TABLET | Freq: Every day | ORAL | Status: DC
  Administered 2019-04-12 – 2019-05-02 (×20): 1 via ORAL
  Filled 2019-04-12 (×21): qty 1

## 2019-04-12 NOTE — Progress Notes (Addendum)
Patient ID: Jesse Murphy, male   DOB: 1928-04-30, 86 y.o.   MRN: [id number]    Advanced Heart Failure Rounding Note  PCP-Cardiologist: Kate Sable, MD   Subjective:    Impella removed 2/23. Intraoperative TEE showed EF ~40%  Remains on trach collar.   Off VP. On NE 5 mcg + midodrine 10 tid. Co-ox 65%.   CCRT stopped 2/24 after clotting. SCr 3.25>>2.43>>3.49. K 5.1. Difficult to assess for renal recovery. Difficult to accurately track UOP as CBI resumed yesterday for recurrent clots.    hgb back down to 7.5. getting 1 u prbcs   Sitting up in bed today. Talking. No distress.    Objective:   Weight Range: 86.5 kg Body mass index is 29 kg/m.   Vital Signs:   Temp:  [97.4 F (36.3 C)-98.2 F (36.8 C)] 98.2 F (36.8 C) (02/25 0930) Pulse Rate:  [64-84] 80 (02/25 0930) Resp:  [13-30] 23 (02/25 0930) BP: (57-123)/(33-90) 123/39 (02/25 0930) SpO2:  [91 %-100 %] 96 % (02/25 0930) Arterial Line BP: (117-130)/(37-39) 117/37 (02/24 1200) FiO2 (%):  [28 %] 28 % (02/25 0735) Weight:  [86.5 kg] 86.5 kg (02/25 0230) Last BM Date: 04/12/19  Weight change: Filed Weights   04/11/19 0500 04/12/19 0230  Weight: 86.9 kg 86.5 kg    Intake/Output:   Intake/Output Summary (Last 24 hours) at 04/12/2019 1039 Last data filed at 04/12/2019 1000 Gross per 24 hour  Intake 14498.05 ml  Output 15130 ml  Net -631.95 ml      Physical Exam   CVP 4 General:  Elderly WM, on vent through TC. Awake and no distress  HEENT: normal + cor-trak Neck: supple. + TC Carotids 2+ bilat; no bruits. No lymphadenopathy or thryomegaly appreciated. Cor: PMI nondisplaced. RRR. Trialysis cath ok  Lungs: course BS bilaterally  Abdomen: soft, nontender, nondistended. No hepatosplenomegaly. No bruits or masses. Good bowel sounds. Extremities: no cyanosis, clubbing, rash, trace bilateral LEE edema + SCDs Neuro: alert follows commands  Telemetry   NSR 70-80s Personally reviewed  Labs     CBC Recent Labs    04/11/19 0415 04/12/19 0508  WBC 10.7* 8.8  HGB 8.5* 7.5*  HCT 26.7* 24.1*  MCV 92.4 94.5  PLT 143* 638   Basic Metabolic Panel Recent Labs    04/11/19 0415 04/11/19 0415 04/11/19 1546 04/12/19 0508  NA 135   < > 137 137  K 5.7*   < > 5.0 5.1  CL 104   < > 103 101  CO2 20*   < > 23 22  GLUCOSE 95   < > 168* 151*  BUN 54*   < > 38* 60*  CREATININE 3.25*   < > 2.43* 3.49*  CALCIUM 7.7*   < > 7.5* 7.6*  MG 2.4  --   --  2.4  PHOS 5.2*   < > 3.9 6.0*   < > = values in this interval not displayed.   Liver Function Tests Recent Labs    04/11/19 1546 04/12/19 0508  ALBUMIN 2.0* 1.9*   No results for input(s): LIPASE, AMYLASE in the last 72 hours. Cardiac Enzymes No results for input(s): CKTOTAL, CKMB, CKMBINDEX, TROPONINI in the last 72 hours.  BNP: BNP (last 3 results) Recent Labs    03/22/19 0401 03/26/19 0626 03/27/19 1255  BNP 340.4* 687.5* 800.4*    ProBNP (last 3 results) No results for input(s): PROBNP in the last 8760 hours.   D-Dimer No results for input(s): DDIMER in  the last 72 hours. Hemoglobin A1C No results for input(s): HGBA1C in the last 72 hours. Fasting Lipid Panel No results for input(s): CHOL, HDL, LDLCALC, TRIG, CHOLHDL, LDLDIRECT in the last 72 hours. Thyroid Function Tests No results for input(s): TSH, T4TOTAL, T3FREE, THYROIDAB in the last 72 hours.  Invalid input(s): FREET3  Other results:   Imaging    DG Chest Port 1 View  Result Date: 04/12/2019 CLINICAL DATA:  Tracheostomy, post CABG, hypertension, prostate cancer EXAM: PORTABLE CHEST 1 VIEW COMPARISON:  Portable exam 0549 hours compared to 04/11/2019 FINDINGS: Tracheostomy tube, nasogastric tube, RIGHT arm PICC line, and LEFT jugular dual-lumen central venous catheter unchanged. Upper normal size of cardiac silhouette post CABG and LEFT atrial appendage clipping. Scattered interstitial infiltrates in the perihilar regions slightly greater on LEFT  favor mild pulmonary edema. Atelectasis versus consolidation LEFT lower lobe with small associated LEFT pleural effusion. No pneumothorax. IMPRESSION: Mild pulmonary edema with atelectasis versus consolidation in LEFT lower lobe and small LEFT pleural effusion. Electronically Signed   By: Lavonia Dana M.D.   On: 04/12/2019 09:19   DG Abd Portable 1V  Result Date: 04/11/2019 CLINICAL DATA:  Enteric catheter placement EXAM: PORTABLE ABDOMEN - 1 VIEW COMPARISON:  03/30/2019 FINDINGS: Frontal view of the lower chest and upper abdomen demonstrates an enteric catheter tip projecting over gastric antrum. Epicardial pacing wires are noted. Postsurgical changes from median sternotomy. Bowel gas pattern is unremarkable. Consolidation and/or effusion at the left lung base slightly improved. IMPRESSION: 1. Enteric catheter overlying gastric antrum. Electronically Signed   By: Randa Ngo M.D.   On: 04/11/2019 19:02     Medications:     Scheduled Medications: . sodium chloride   Intravenous Once  . aspirin  81 mg Per Tube Daily  . atorvastatin  80 mg Per Tube q1800  . B-complex with vitamin C  1 tablet Oral Daily  . chlorhexidine gluconate (MEDLINE KIT)  15 mL Mouth Rinse BID  . Chlorhexidine Gluconate Cloth  6 each Topical Daily  . darbepoetin (ARANESP) injection - NON-DIALYSIS  40 mcg Subcutaneous Q Tue-1800  . feeding supplement (NEPRO CARB STEADY)  1,000 mL Per Tube TID WC  . feeding supplement (PRO-STAT SUGAR FREE 64)  60 mL Per Tube BID  . Gerhardt's butt cream   Topical BID  . hydrocortisone   Rectal BID  . insulin aspart  0-24 Units Subcutaneous Q4H  . insulin aspart  5 Units Subcutaneous Q4H  . insulin glargine  26 Units Subcutaneous Daily  . mouth rinse  15 mL Mouth Rinse 10 times per day  . midodrine  10 mg Oral TID WC  . pantoprazole (PROTONIX) IV  40 mg Intravenous Q24H  . sevelamer carbonate  800 mg Oral TID WC  . sodium chloride flush  10-40 mL Intracatheter Q12H  . tamsulosin  0.4  mg Oral Daily    Infusions: . sodium chloride    . sodium chloride Stopped (04/04/19 1736)  . sodium chloride Stopped (04/12/19 0721)  . amiodarone 30 mg/hr (04/12/19 1000)  . feeding supplement (VITAL 1.5 CAL)    . meropenem (MERREM) IV    . norepinephrine (LEVOPHED) Adult infusion 5 mcg/min (04/12/19 1000)    PRN Medications: sodium chloride, oxyCODONE **AND** acetaminophen, dextrose, fentaNYL (SUBLIMAZE) injection, midazolam, ondansetron (ZOFRAN) IV, sodium chloride flush, sodium chloride flush    Assessment/Plan   1. Acute systolic HF -> Cardiogenic shock - post-op echo on 03/20/19 EF 25-30% (pre-op 30-35%. In 12/2017 EF normal) -Required Impella support post  CABG. Impella removed 2/23 - Off VP. No NE 5 mcg + midodrine 10 tid. Co-ox 65% - off CCRT, stopped 2/24 after clotting. Hard to tell if making urine with CBI.  - Family seems most interested in continuing with aggressive care though Mr. Creelman at times seems to favor switching to comfort care though he has not voiced this definitively.  - For now, we will continue aggressive therapy. So far, doing ok off impella. Will see how he does off CCRT.   2. CAD s/p CABG this admit (LIMA->LAD, SVG->OM, SVG->RCA on 03/15/19) - No angina - Continue ASA +statin  3. Acute hypoxic respiratory failure with Pseudomonas PNA - s/p trach on 03/27/19 - on TC - on meropenem for pseudomonas PNA   4. PAF - s/p MAZE and LAA occlusion - In/out of AF. Remains in NSR overnight  -continue IV amio 30 mg/hr.  - Off systemic heparin with hematuria and melena/ anemia.   5. AKI  - due to shock/ATN - Creatinine peaked at 5.04 - Started [facility] 03/29/19 per Nephrology. Stopped 2/24 due to clotting - CVP 4 - Hard to tell if making urine with CBI. Will try to keep track I/O.    6. Hematuria  -Off heparin drip.  - Continue CBI     - Urology following.   7. Melena - No melena overnight.   8. Anemia -Hematuria/melena. -Hgb 7.5 today. Transfuse x 1  unit   9. Debility, severe - continue PT - get OOB and in chair today  10. Hyperkalemia - in the setting of renal failure. Now off CRRT - K 5.1 today. Continue loklema as needed    Lyda Jester, PA-C  10:39 AM    Agree with above.   Impella out. Off CVVHD this am due to clotted filter. Remains on trach collar. On NE 5 and midodrine 10 tid. BP soft but stable. Co-ox 63%. Urine output unclear as back on TBI. Creatinine rising. Hyperkalemia improved.   General:  Weak appearing. On trach collar HEENT: normal Neck: supple. JVP 5-6 Carotids 2+ bilat; no bruits. No lymphadenopathy or thryomegaly appreciated. +Trach collar   Cor: PMI nondisplaced. Regular rate & rhythm. No rubs, gallops or murmurs. axiallary wound ok LSC trialyis Lungs: clear Abdomen: soft, nontender, nondistended. No hepatosplenomegaly. No bruits or masses. Good bowel sounds. Extremities: no cyanosis, clubbing, rash, edema Neuro: awake and interactive  Stable off Impella but stil requiring NE for BP support. Wean pressors slowly. Continue midodrine. Off CVVHD. Creatinine rising. Hard to assess u/o with TBI. Hyperkalemia improved with Lokelma.  Continue to assess for Renal recovery. Would hold off on lasix trial today until CVP climbs. If can avoid restarting HD for several days would favor that unless clear indication to restart. Attempt iHD when indicated.   CRITICAL CARE Performed by: Glori Bickers  Total critical care time: 40 minutes  Critical care time was exclusive of separately billable procedures and treating other patients.  Critical care was necessary to treat or prevent imminent or life-threatening deterioration.  Critical care was time spent personally by me (independent of midlevel providers or residents) on the following activities: development of treatment plan with patient and/or surrogate as well as nursing, discussions with consultants, evaluation of patient's response to treatment,  examination of patient, obtaining history from patient or surrogate, ordering and performing treatments and interventions, ordering and review of laboratory studies, ordering and review of radiographic studies, pulse oximetry and re-evaluation of patient's condition.  Glori Bickers, MD  7:53 PM

## 2019-04-12 NOTE — Progress Notes (Signed)
Chaplain engaged in follow-up visit.  Chaplain offered support and affirmed Jesse Murphy's progress. Jesse Murphy expressed gratefulness to be talking and to be able to drink water and eat pudding.  Chaplain celebrated Jesse Murphy's progress.  Chaplain will continue to follow-up.

## 2019-04-12 NOTE — Progress Notes (Signed)
NAME:  Jesse Murphy, MRN:  [id number], DOB:  1928-07-01, LOS: 52 ADMISSION DATE:  03/13/2019, CONSULTATION DATE:  2/2 REFERRING MD:  Nils Pyle, CHIEF COMPLAINT:  Dyspnea   Brief History   86 y/o male admitted 1/26, underwent CABG and L atrial appendage clipping on 1/29, moved to ICU on 2/1 for intubation.  Past Medical History  A fib, CAD, Prostate cancer, HTN  Significant Hospital Events   1/29 CABG 2/01 to ICU 2/05 Extubated 2/08 Reintubated 2/09 tracheostomy 2/09 Impella, PA catheter placed 2/11 CRRT initiated 2/16 leukocytosis, add vancomycin 2/17 start TC trials 2/19 urology placed 3 way foley, bleeding at rectal tube site 2/21 ATC initiated  2/22 1 unit PRBC 2/23 1 unit PRBC  Consults:  Nephrology CHF team Urology  Procedures:  Rt PICC 2/02 >> Trach 2/09 >> PA catheter 2/09 >> Lt [place] HD cath 2/11 >> L Rad Aline 2/3 >>  Impella 2/10 >> 2/23  Significant Diagnostic Tests:  Echo 1/26 >> EF 30 to 35%  Micro Data:  SARS CoV2 PCR 1/26 >> negative Influenza PCR 1/26 >> negative Sputum 2/01 >> oral flora Sputum 2/08 >> Pseudomonas >> pan sensitive  Blood 2/16 >> negative Sputum 2/16 >> Pseudomonas sens to cipro, gent, imepenem BCx2 2/20 >> NG at 4 days  Antimicrobials:  Rocephin 2/01 >> 2/07 Cefepime 2/08 >> 2/10 Fortaz 2/10 >> 2/16 Vancomycin 2/16 >> 2/19 Meropenem 2/20 >>  Interim history/subjective:  NAEON.  CRRT clotted off yesterday and patient was given [facility] for elevated K+.  Remains on trach collar. Denies pain.  Denies recent melena.    Objective   Blood pressure (!) 108/37, pulse 80, temperature 98.2 F (36.8 C), temperature source Oral, resp. rate (!) 22, height 5\' 8"  (1.727 m), weight 86.5 kg, SpO2 95 %. CVP:  [3 mmHg-7 mmHg] 4 mmHg  FiO2 (%):  [28 %] 28 %   Intake/Output Summary (Last 24 hours) at 04/12/2019 0940 Last data filed at 04/12/2019 0900 Gross per 24 hour  Intake 12855.22 ml  Output 14917 ml  Net -2061.78 ml   Filed  Weights   04/11/19 0500 04/12/19 0230  Weight: 86.9 kg 86.5 kg    Examination: General:  Elderly male lying in bed in NAD HEENT: trach midline c/d/i  Neuro: Awake, alert, interacts appropriately, generalized weakness but moves all extremities CV: RRR PULM:  rales present bilaterally GI: soft, non-tender, non-distended Extremities: warm/dry, mild-mod edema Skin: no rashes or lesions  Resolved Hospital Problem list     Assessment & Plan:   Acute Hypoxemic Respiratory Failure due to Acute pulmonary edema complicated by Pseudomonas HCAP. Small Left Pleural Post-pericardiotomy Effusion  -open ended trach collar as tolerated  -if new fatigue, rest on PRVC / PSV intermittently  -follow intermittent CXR  -cont meropenem (2/20 - )  Cardiogenic Shock with acute systolic heart failure. New onset atrial fibrillation >> back in sinus rhythm. -Tolerated vasopressin wean yesterday -Wean NE to off -continue ASA, lipitor, mododrine, amiodarone  -heparin gtt on hold in setting of hematuria, rectal bleeding (resolved, suspect related to hemorrhoids/flexi seal use)  AKI secondary to Cardiogenic Shock. CRRT clotted off yesterday.  HD planned for tomorrow. Milly Jakob to correct K+  Anemia of critical illness. -trend CBC  -transfuse for Hgb <7% or significant bleeding   Rectal bleeding Noted 2/19 Suspect this is related to irritation from flexi-seal.  No further bleeding since removal  -follow clinically   Hematuria with retained clots -Stop CBI now that clots gone.  DM2 with hyperglycemia, poorly controlled -SSI  -TF coverage 5 units Q4, hold while NPO -lantus 26 units QD  Pressure wounds / Reported as Skin Tear 2/15 -WOC following, appreciate input    Best practice:  Diet: Pending swallow study DVT prophylaxis: SCD's GI prophylaxis: PPI Mobility: bed rest Code Status: Partial Code  Disposition: ICU  Labs    CMP Latest Ref Rng & Units 04/12/2019 04/11/2019 04/11/2019   Glucose 70 - 99 mg/dL 151(H) 168(H) 95  BUN 8 - 23 mg/dL 60(H) 38(H) 54(H)  Creatinine 0.61 - 1.24 mg/dL 3.49(H) 2.43(H) 3.25(H)  Sodium 135 - 145 mmol/L 137 137 135  Potassium 3.5 - 5.1 mmol/L 5.1 5.0 5.7(H)  Chloride 98 - 111 mmol/L 101 103 104  CO2 22 - 32 mmol/L 22 23 20(L)  Calcium 8.9 - 10.3 mg/dL 7.6(L) 7.5(L) 7.7(L)  Total Protein 6.5 - 8.1 g/dL - - -  Total Bilirubin 0.3 - 1.2 mg/dL - - -  Alkaline Phos 38 - 126 U/L - - -  AST 15 - 41 U/L - - -  ALT 0 - 44 U/L - - -    CBC Latest Ref Rng & Units 04/12/2019 04/11/2019 04/10/2019  WBC 4.0 - 10.5 K/uL 8.8 10.7(H) 11.6(H)  Hemoglobin 13.0 - 17.0 g/dL 7.5(L) 8.5(L) 8.5(L)  Hematocrit 39.0 - 52.0 % 24.1(L) 26.7(L) 26.6(L)  Platelets 150 - 400 K/uL 179 143(L) 136(L)    ABG    Component Value Date/Time   PHART 7.488 (H) 04/07/2019 0405   PCO2ART 34.4 04/07/2019 0405   PO2ART 101.0 04/07/2019 0405   HCO3 25.9 04/07/2019 0405   TCO2 27 04/07/2019 0405   ACIDBASEDEF 1.0 04/05/2019 0540   O2SAT 64.6 04/12/2019 0508    CBG (last 3)  Recent Labs    04/11/19 2338 04/12/19 0337 04/12/19 0828  GLUCAP 98 93 97    CRITICAL CARE Performed by: Mallie Darting, MS4   Total critical care time: 45 minutes  Critical care time was exclusive of separately billable procedures and treating other patients.  Critical care was necessary to treat or prevent imminent or life-threatening deterioration.  Critical care was time spent personally by me on the following activities: development of treatment plan with patient and/or surrogate as well as nursing, discussions with consultants, evaluation of patient's response to treatment, examination of patient, obtaining history from patient or surrogate, ordering and performing treatments and interventions, ordering and review of laboratory studies, ordering and review of radiographic studies, pulse oximetry, re-evaluation of patient's condition and participation in multidisciplinary rounds.

## 2019-04-12 NOTE — Procedures (Signed)
Tracheostomy Change Note  Patient Details:   Name: Jesse Murphy DOB: Jan 19, 1929 MRN: [id number]    Airway Documentation:     Evaluation  O2 sats: stable throughout Complications: No apparent complications Patient did tolerate procedure well. Bilateral Breath Sounds: Diminished   Patient's trach was downsized to a #6 cuffed shiley without any complications. Patient tolerated trach change well. [name] was secured with trach ties, positive color change on CO2 detector.   Claretta Fraise 04/12/2019, 3:58 PM

## 2019-04-12 NOTE — Progress Notes (Signed)
   04/12/19 0800  SLP Visit Information  SLP Received On 04/12/19  General Information  HPI 86 y.o. male with PMH: h/o CAD s/p CABG and clipping of left atrial appendage, who presented with SOB on 2/1. He became hypoxic on 2/2 with pulmonary edema and needed to be intubated on 2/2 and extubated 2/5. Reintubated 2/8.  Pt underwent impella placement and tracheostomy on 2/9. Pt started on CRRT 2/11. ATC trials on 2/17. Impella removal 2/23.  Type of Study Bedside Swallow Evaluation  Previous Swallow Assessment BSE prior to trach/second intuabation - soft solids and thins recommended  Diet Prior to this Study NPO;NG Tube  Temperature Spikes Noted No  Respiratory Status Nasal cannula  History of Recent Intubation Yes  Length of Intubations (days) 6 days (twice)  Date extubated 03/27/19  Behavior/Cognition Alert;Cooperative;Pleasant mood  Oral Cavity Assessment WFL  Oral Care Completed by SLP No  Oral Cavity - Dentition Edentulous  Self-Feeding Abilities Total assist  Patient Positioning Upright in bed  Baseline Vocal Quality Hoarse  Volitional Cough Strong  Volitional Swallow Able to elicit  Oral Motor/Sensory Function  Overall Oral Motor/Sensory Function WFL  Ice Chips  Ice chips Impaired  Presentation Spoon  Pharyngeal Phase Impairments Cough - Immediate;Throat Clearing - Immediate  Thin Liquid  Thin Liquid Impaired  Presentation Straw  Pharyngeal  Phase Impairments Cough - Immediate;Throat Clearing - Immediate  Nectar Thick Liquid  Nectar Thick Liquid NT  Honey Thick Liquid  Honey Thick Liquid NT  Puree  Puree WFL  Presentation Spoon  Solid  Solid NT  SLP Assessment  Clinical Impression Statement (ACUTE ONLY) At time of assessment, pt with PMSV in place, also with firm NG tube. Vocal quality slightly horase/wet sounding, though pt able to orally expectorate secretions with cueing. Pt tolerated bites of pudding without incident, but sips of water or bites of ice consistently  result in coughing and throat clearing. Suspect this may be a combination of dyspahgia and impact of tube on bolus passage and laryngeal closure. RN reports pt will have NG tube removed and [name] placed via C arm today. Will plan to complete a FEES after NG tube removal. Pt can have cortrak placed after if needed, though prognosis for oral intake is good.

## 2019-04-12 NOTE — Progress Notes (Addendum)
Pharmacy Antibiotic Note  Jesse Murphy is a 86 y.o. male  with pseudomonal PNA on meropenem day 4  (recenlty completed a course of fortaz for pseudomonas on 2/16) .   Was on CRRT 2/11 >>2/23, restarted 2/24 - however circuit clotted. Now plans to consider potential iHD. Uop inaccurate given irrigation. Plan to discontinue on 2/26.  Plan: -Given plan for iHD, will order one more dose of meropenem 500 gm IV q24 on 2/26(stop after last dose on 2/26)  -Will follow renal function, cultures and clinical progress   Height: 5\' 8"  (172.7 cm) Weight: 190 lb 11.2 oz (86.5 kg) IBW/kg (Calculated) : 68.4  Temp (24hrs), Avg:98 F (36.7 C), Min:97.4 F (36.3 C), Max:98.2 F (36.8 C)  Recent Labs  Lab 04/09/19 1400 04/09/19 1505 04/10/19 0345 04/10/19 1005 04/10/19 1512 04/11/19 0415 04/11/19 1546 04/12/19 0508  WBC 10.8*  --  10.2 11.6*  --  10.7*  --  8.8  CREATININE  --    < > 1.66*  --  2.25* 3.25* 2.43* 3.49*   < > = values in this interval not displayed.    Estimated Creatinine Clearance: 15.6 mL/min (A) (by C-G formula based on SCr of 3.49 mg/dL (H)).    No Known Allergies  Antimicrobials this admission: CTX 2/1 >> 2/7 Cefepime 2/8 >> 2/10 Fortaz 2/10 >> 2/16 Vanc 2/16>>2/19 Meropenem 2/20>>   Microbiology results: 2/20 blood- ngtd 2/20 respiratory cx-  Pseudomonas (intermediate sensitivity to fortaz) 2/16 blood x 2: neg 2/16 TA: pseudomonas, few c.tropicalis 2/8 resp cx - Pseudomonas- R - cefepime (not included on report, S fortaz,cipro, gent, primaxin) 2/1 TA: normal flora 1/28 MRSA: neg 1/26 COVID/flu: neg  Thank you for allowing pharmacy to be a part of this patient's care.  Antonietta Jewel, PharmD, [place] Clinical Pharmacist  Phone: [phone]  Please check AMION for all [place] phone numbers After 10:00 PM, call [place] [phone]

## 2019-04-12 NOTE — Progress Notes (Addendum)
Nephrology Progress Note:  Patient ID: Jesse Murphy, male   DOB: 02/25/1928, 86 y.o.   MRN: [id number]   S:  Ins/outs not accurate due to irrigation.  CRRT stopped after clotting - occurred yesterday afternoon on chart review.  Nephrology was not notified.  Had 452 mL UF over 4/24 with CRRT.  They are unable to stop bladder irrigation as patient has recurrent clots and is uncomfortable when they have attempted per nursing.  He is on levo at 3 mcg/min.  CVP 3.   He got lokelma last night.   Review of systems:   Denies air hunger  Denies n/v Unable to quantify urine - bladder irrigation   O:BP (!) 113/40   Pulse 81   Temp 98.1 F (36.7 C) (Oral)   Resp 17   Ht 5' 8" (1.727 m)   Wt 86.5 kg   SpO2 98%   BMI 29.00 kg/m   Intake/Output Summary (Last 24 hours) at 04/12/2019 0519 Last data filed at 04/12/2019 0248 Gross per 24 hour  Intake 9810.17 ml  Output 11630 ml  Net -1819.83 ml   Intake/Output: I/O last 3 completed shifts: In: 35395.9 [I.V.:1232.4; Blood:630; Other:33108.5; NG/GT:225; IV Piggyback:200.1] Out: 31631 [Urine:30978; Drains:17; Other:636]  Intake/Output this shift:  Total I/O In: 6203.9 [I.V.:203.9; Other:6000] Out: 8500 [Urine:8500] Weight change: -0.4 kg   Physical exam:     Gen: elderly male in bed chronically ill-appearing HEENT trach in place; on vent  CVS: S1 S2 no rub Resp: clearer breath sounds; occ rhonchi; unlabored at rest; trach collar Abd: soft/ NT/ND  Ext: 1+ lower extremity edema Neuro - awake and interactive and follows commands, nods Psych - no anxiety or agitation  GU foley in place with hematuria/irrigation Access: left chest nontunneled dialysis catheter   Recent Labs  Lab 04/08/19 1543 04/09/19 0338 04/09/19 1505 04/10/19 0345 04/10/19 1512 04/11/19 0415 04/11/19 1546  NA 136 135 138 138 134* 135 137  K 4.8 4.7 4.8 4.8 5.0 5.7* 5.0  CL 102 100 104 104 102 104 103  CO2 25 25 25 27 22 20* 23  GLUCOSE 170* 172* 153* 108* 167*  95 168*  BUN 35* 36* 34* 31* 40* 54* 38*  CREATININE 1.75* 1.75* 1.77* 1.66* 2.25* 3.25* 2.43*  ALBUMIN 2.3* 2.4* 2.2* 2.2* 2.2* 2.0* 2.0*  CALCIUM 7.6* 7.6* 7.4* 7.8* 7.3* 7.7* 7.5*  PHOS 2.6 2.6 3.2 2.6 3.1 5.2* 3.9   Liver Function Tests: Recent Labs  Lab 04/10/19 1512 04/11/19 0415 04/11/19 1546  ALBUMIN 2.2* 2.0* 2.0*    CBC: Recent Labs  Lab 04/06/19 1233 04/07/19 0405 04/09/19 0338 04/09/19 0338 04/09/19 1400 04/09/19 1400 04/10/19 0345 04/10/19 1005 04/11/19 0415  WBC 16.6*   < > 10.0   < > 10.8*   < > 10.2 11.6* 10.7*  NEUTROABS 13.7*  --   --   --   --   --   --   --   --   HGB 8.4*   < > 7.0*   < > 8.0*   < > 7.8* 8.5* 8.5*  HCT 26.0*   < > 22.5*   < > 25.1*   < > 24.8* 26.6* 26.7*  MCV 91.5   < > 94.5  --  93.0  --  93.6 93.3 92.4  PLT 74*   < > 111*   < > 113*   < > 134* 136* 143*   < > = values in this interval not displayed.   Cardiac   Enzymes: No results for input(s): CKTOTAL, CKMB, CKMBINDEX, TROPONINI in the last 168 hours. CBG: Recent Labs  Lab 04/11/19 1111 04/11/19 1559 04/11/19 1946 04/11/19 2338 04/12/19 0337  GLUCAP 95 91 85 98 93   IStudies/Results: DG CHEST PORT 1 VIEW  Result Date: 04/11/2019 CLINICAL DATA:  Respiratory failure EXAM: PORTABLE CHEST 1 VIEW COMPARISON:  04/10/2019 FINDINGS: Stable positioning of tracheostomy tube. Interval removal of enteric tube. Right-sided PICC line terminates at the level of the distal SVC. Impella device has been removed. Left subclavian central venous catheter stable terminating at the level of the SVC. Prior CABG and atrial appendage clip. Stable cardiomediastinal contours. Persistent opacity at the left lung base likely effusion and associated atelectasis. Right lung appears clear. No pneumothorax. IMPRESSION: 1. Interval removal of enteric tube and Impella device. 2. Persistent left basilar opacity likely representing effusion and associated atelectasis. Electronically Signed   By: Davina Poke  D.O.   On: 04/11/2019 09:44   DG Chest Port 1 View  Result Date: 04/10/2019 CLINICAL DATA:  Tracheostomy, LVAD, CABG EXAM: PORTABLE CHEST 1 VIEW COMPARISON:  Portable exam 0539 hours compared to 04/08/2019 FINDINGS: Tracheostomy tube projects over tracheal air column unchanged. Nasogastric tube extends into abdomen. RIGHT arm PICC line tip projects over cavoatrial junction. Impella device projects over cardiac silhouette unchanged. Dual lumen LEFT subclavian central venous catheter with tip projecting over SVC. Epicardial pacing wires noted. Normal heart size post CABG and Maze procedure. Mediastinal contours normal. Atherosclerotic calcification aorta. Increased LEFT pleural effusion and basilar atelectasis versus consolidation. No pneumothorax. IMPRESSION: Postoperative changes with tubes and lines as above, grossly unchanged. Increased LEFT pleural effusion and basilar atelectasis. Electronically Signed   By: Lavonia Dana M.D.   On: 04/10/2019 09:06   DG Abd Portable 1V  Result Date: 04/11/2019 CLINICAL DATA:  Enteric catheter placement EXAM: PORTABLE ABDOMEN - 1 VIEW COMPARISON:  03/30/2019 FINDINGS: Frontal view of the lower chest and upper abdomen demonstrates an enteric catheter tip projecting over gastric antrum. Epicardial pacing wires are noted. Postsurgical changes from median sternotomy. Bowel gas pattern is unremarkable. Consolidation and/or effusion at the left lung base slightly improved. IMPRESSION: 1. Enteric catheter overlying gastric antrum. Electronically Signed   By: Randa Ngo M.D.   On: 04/11/2019 19:02   . aspirin  81 mg Per Tube Daily  . atorvastatin  80 mg Per Tube q1800  . B-complex with vitamin C  1 tablet Oral Daily  . chlorhexidine gluconate (MEDLINE KIT)  15 mL Mouth Rinse BID  . Chlorhexidine Gluconate Cloth  6 each Topical Daily  . darbepoetin (ARANESP) injection - NON-DIALYSIS  40 mcg Subcutaneous Q Tue-1800  . feeding supplement (PRO-STAT SUGAR FREE 64)  60 mL  Per Tube BID  . Gerhardt's butt cream   Topical BID  . hydrocortisone   Rectal BID  . insulin aspart  0-24 Units Subcutaneous Q4H  . insulin aspart  5 Units Subcutaneous Q4H  . insulin glargine  26 Units Subcutaneous Daily  . mouth rinse  15 mL Mouth Rinse 10 times per day  . midodrine  10 mg Oral TID WC  . pantoprazole (PROTONIX) IV  40 mg Intravenous Q24H  . sodium chloride flush  10-40 mL Intracatheter Q12H  . tamsulosin  0.4 mg Oral Daily    BMET    Component Value Date/Time   NA 137 04/11/2019 1546   K 5.0 04/11/2019 1546   CL 103 04/11/2019 1546   CO2 23 04/11/2019 1546   GLUCOSE 168 (H)  04/11/2019 1546   BUN 38 (H) 04/11/2019 1546   CREATININE 2.43 (H) 04/11/2019 1546   CALCIUM 7.5 (L) 04/11/2019 1546   GFRNONAA 23 (L) 04/11/2019 1546   GFRAA 27 (L) 04/11/2019 1546   CBC    Component Value Date/Time   WBC 10.7 (H) 04/11/2019 0415   RBC 2.89 (L) 04/11/2019 0415   HGB 8.5 (L) 04/11/2019 0415   HCT 26.7 (L) 04/11/2019 0415   PLT 143 (L) 04/11/2019 0415   MCV 92.4 04/11/2019 0415   MCH 29.4 04/11/2019 0415   MCHC 31.8 04/11/2019 0415   RDW 16.1 (H) 04/11/2019 0415   LYMPHSABS 0.5 (L) 04/06/2019 1233   MONOABS 1.9 (H) 04/06/2019 1233   EOSABS 0.2 04/06/2019 1233   BASOSABS 0.0 04/06/2019 1233    Assessment/Plan:  1. AKI(Cr was 0.7 prior to surgery)- due to postoperative CHF/cardiogenic shock that was refractory to diuretics and started on CRRT on 03/29/19 for volume removal.  CRRT off on 2/23 - discussion with advanced CHF.  CRRT restarted 2/24 then off after clotting 1. Plan for attempted transition to iHD as tolerated; anticipate treatment on 2/26 unless needed urgently today for clearance  2. Per advanced CHF goal CVP of 5-8 while not having to increase pressors 3. Would transition to nepro for feeds 2. Acute systolic CHF/cardiogenic shock- EF 25-30%; s/p Impella. optimize volume as above  3. CAD s/p CABG on 03/15/19 complicated by cardiogenic shock and  ARF. 4. Acute hypoxic respiratory failure with pseudomonas pneumonia- s/p trach on 03/27/19. abx per PCCM 5. PAF- s/p MAZE and LAA occlusion- therapy per CHF team 6. Hematuria- bladder irrigation 7. ABLA- Hx Melena. Transfuse as needed. PRBC's on 2/22 and 2/23.  8. Protein malnutrition- moderate to severe 9. Hypophosphatemia- resolved and now phos up. Start renvela 10. Vascular access- left [place] HD catheter placed 03/29/19 11. Disposition- He is not a longterm dialysis candidate given his cardiogenic shock and trach.  Concern that he would poorly tolerate iHD   C , MD 04/12/2019 6:15 AM  

## 2019-04-12 NOTE — Evaluation (Signed)
[facility] Speaking Valve - Evaluation Patient Details  Name: Jesse Murphy MRN: [id number] Date of Birth: Feb 11, 1929  Today's Date: 04/12/2019 Time: 7858-8502 SLP Time Calculation (min) (ACUTE ONLY): 33 min  Past Medical History:  Past Medical History:  Diagnosis Date  . Atrial fibrillation ([place])   . CAD (coronary artery disease)    a. s/p PCI in 1992 b. Coronary CT in 01/2018 showing extensive coronary calcification; FFR indeterminate --> medical management pursued at that time as patient not interested in repeat cath  . Cancer [facility])    prostate  . Dyspnea   . Hypertension    Past Surgical History:  Past Surgical History:  Procedure Laterality Date  . CLIPPING OF ATRIAL APPENDAGE N/A 03/16/2019   Procedure: CLIPPING OF LEFT  ATRIAL APPENDAGE - USING ATRICLIP SIZE 40;  Surgeon: Ivin Poot, MD;  Location: [place];  Service: Open Heart Surgery;  Laterality: N/A;  . COLONOSCOPY N/A 08/25/2017   Procedure: COLONOSCOPY;  Surgeon: Rogene Houston, MD;  [facility];  Service: Endoscopy;  Laterality: N/A;  . CORONARY ARTERY BYPASS GRAFT N/A 03/16/2019   Procedure: CORONARY ARTERY BYPASS GRAFTING (CABG), ON PUMP, TIMES THREE, USING LEFT INTERNAL MAMMARY ARTERY, RIGHT GREATER SAPHENOUS VEIN HARVESTED ENDOSCOPICALLY;  Surgeon: Ivin Poot, MD;  Location: [place];  Service: Open Heart Surgery;  Laterality: N/A;  swan only  . HERNIA REPAIR    . MAZE N/A 03/16/2019   Procedure: MAZE;  Surgeon: Ivin Poot, MD;  Location: [place];  Service: Open Heart Surgery;  Laterality: N/A;  . PLACEMENT OF IMPELLA LEFT VENTRICULAR ASSIST DEVICE Right 03/27/2019   Procedure: Placement Of Impella Left Ventricular Assist Device using ABIOMED Impella 5.5 with SmartAssist Device.;  Surgeon: Wonda Olds, MD;  Location: [facility];  Service: Thoracic;  Laterality: Right;  . PROSTATE SURGERY    . REMOVAL OF IMPELLA LEFT VENTRICULAR ASSIST DEVICE N/A 04/10/2019   Procedure: REMOVAL OF IMPELLA LEFT  VENTRICULAR ASSIST DEVICE WITH INSERTION OF RIGHT FEMORAL ARTERIAL LINE;  Surgeon: Ivin Poot, MD;  Location: [place];  Service: Open Heart Surgery;  Laterality: N/A;  . RIGHT/LEFT HEART CATH AND CORONARY ANGIOGRAPHY N/A 03/15/2019   Procedure: RIGHT/LEFT HEART CATH AND CORONARY ANGIOGRAPHY;  Surgeon: Burnell Blanks, MD;  Location: [place] CV LAB;  Service: Cardiovascular;  Laterality: N/A;  . TEE WITHOUT CARDIOVERSION N/A 03/16/2019   Procedure: TRANSESOPHAGEAL ECHOCARDIOGRAM (TEE);  Surgeon: Prescott Gum, Collier Salina, MD;  Location: [place];  Service: Open Heart Surgery;  Laterality: N/A;  . TRACHEOSTOMY TUBE PLACEMENT N/A 03/27/2019   Procedure: TRACHEOSTOMY placed using Shiley 8DCT Cuffed.;  Surgeon: Wonda Olds, MD;  [facility];  Service: Thoracic;  Laterality: N/A;   HPI:  86 y.o. male with PMH: h/o CAD s/p CABG and clipping of left atrial appendage, who presented with SOB on 2/1. He became hypoxic on 2/2 with pulmonary edema and needed to be intubated on 2/2 and extubated 2/5. Reintubated 2/8.  Pt underwent impella placement and tracheostomy on 2/9. Pt started on CRRT 2/11. ATC trials on 2/17. Impella removal 2/23.   Assessment / Plan / Recommendation Clinical Impression  Pt mentation excellent for participation in PMSV eval, alert and participatory, able to follow complex commands. Pts cuff fully emptied and PMSV placed with immedaite redireciton of airflow to upper airway audible via phonationa nd effective throat clearing. Pt verbalized slight sensation of restricted airflow initially, but tolerated placement for 20 minutes without change in vital signs or signs  of air trapping. Pt able to communicate with slightly hoarse vocal quality and moderately impaired breath support at conversation level. Recommend pt wear PMSV with close intermittent supervision. Asked RN to remove if she is going on break or if pt going to sleep. Will f/u later today for FEES.  SLP Visit Diagnosis:  Aphonia (R49.1)    SLP Assessment  Patient needs continued Speech Lanaguage Pathology Services    Follow Up Recommendations  Inpatient Rehab    Frequency and Duration min 2x/week  2 weeks    PMSV Trial PMSV was placed for: 25 Able to redirect subglottic air through upper airway: Yes Able to Attain Phonation: Yes Voice Quality: Hoarse Able to Expectorate Secretions: Yes Level of Secretion Expectoration with PMSV: Oral Breath Support for Phonation: Moderately decreased Intelligibility: Intelligible Respirations During Trial: 20 SpO2 During Trial: 98 % Behavior: Alert;Cooperative   Tracheostomy Tube  Additional Tracheostomy Tube Assessment Trach Collar Period: all waking hours Secretion Description: clear thin copious mobile Level of Secretion Expectoration: Tracheal    Vent Dependency  Vent Dependent: No FiO2 (%): 28 %    Cuff Deflation Trial  GO Tolerated Cuff Deflation: Yes Behavior: Alert;Cooperative        Lorretta Kerce, Katherene Ponto 04/12/2019, 10:41 AM

## 2019-04-12 NOTE — Progress Notes (Signed)
Nutrition Follow up   DOCUMENTATION CODES:   Not applicable  INTERVENTION:    Nepro Shake po TID, each supplement provides 425 kcal and 19 grams protein  30 ml Prostat BID, each supplement provides 100 kcals and 15 grams protein.   Transition to Renal MVI daily   NUTRITION DIAGNOSIS:   Increased nutrient needs related to post-op healing as evidenced by estimated needs.  Ongoing  GOAL:   Patient will meet greater than or equal to 90% of their needs   Diet just advanced   MONITOR:   Weight trends, Diet advancement, Vent status, Skin, TF tolerance, Labs, I & O's  REASON FOR ASSESSMENT:   Consult Enteral/tube feeding initiation and management  ASSESSMENT:   Patient with PMH significant for CAD s/p PCI, prostate cancer, HTN, CHF, and DM. Presents this admission with chronic a.fib with RVR.   1/28- s/p L heart cath  1/30- s/p CABG x3, MAZE, extubated  2/2- re-intubated  2/5- extubated 2/8- re-intubated  2/10- s/p impella, trach, NGT placed-start trickle feeding 2/11- start CRRT 2/23- removal of impella, [name] dislodged  2/24- CRRT stop  Remains on ATC. Off vasopressin. Plan iHD tomorrow. Had FEEs this afternoon. Diet advanced to DYS 1 with nectar thick liquids. RD to provide supplements and encourage intake. Would hold off on [name] re-insertion and monitor PO intake. Discussed with RN.   Admission weight: 89.3 kg  Current weight: 86.5 kg (down 8 kg in the last week)  I/O: -12,862 ml since 2/11 UOP: 12,533 ml x 24 hrs  CRRT: 452 ml x 24 hrs   Drips: levophed Medications: B complex with Vitamin C, aranesp, SS novolog, lantus, renvela Labs: Phosphorus 6.0 (H) Cr 3.29- trending up CBG 78-172  Diet Order:   Diet Order            DIET - DYS 1 Room service appropriate? Yes; Fluid consistency: Nectar Thick  Diet effective now              EDUCATION NEEDS:   Not appropriate for education at this time  Skin:  Skin Assessment: Skin Integrity  Issues: Skin Integrity Issues:: Stage I, DTI DTI: chest Stage I: vertebral column Incisions: chest, leg  Last BM:  2/25  Height:   Ht Readings from Last 1 Encounters:  03/26/19 5\' 8"  (1.727 m)    Weight:   Wt Readings from Last 1 Encounters:  04/12/19 86.5 kg    Ideal Body Weight:  70 kg  BMI:  Body mass index is 29 kg/m.  Estimated Nutritional Needs:   Kcal:  2200-2400 kcal  Protein:  135-155 grams  Fluid:  >/= 2.2 L/day   Mariana Single RD, LDN Clinical Nutrition Pager # - [phone]

## 2019-04-12 NOTE — Progress Notes (Addendum)
TCTS DAILY ICU PROGRESS NOTE                   Jesse Murphy            Jesse Murphy [id number]          [phone]   2 Days Post-Op Procedure(s) (LRB): REMOVAL OF IMPELLA LEFT VENTRICULAR ASSIST DEVICE WITH INSERTION OF RIGHT FEMORAL ARTERIAL LINE (N/A)  Total Length of Stay:  LOS: 29 days   Subjective: conts with steady overall progress   Objective: Vital signs in last 24 hours: Temp:  [97.4 F (36.3 C)-98.2 F (36.8 C)] 98.2 F (36.8 C) (02/25 0832) Pulse Rate:  [64-84] 79 (02/25 0800) Cardiac Rhythm: Normal sinus rhythm (02/25 0800) Resp:  [13-30] 18 (02/25 0800) BP: (57-121)/(33-90) 102/41 (02/25 0800) SpO2:  [91 %-100 %] 95 % (02/25 0800) Arterial Line BP: (109-130)/(34-39) 117/37 (02/24 1200) FiO2 (%):  [28 %] 28 % (02/25 0735) Weight:  [86.5 kg] 86.5 kg (02/25 0230)  Filed Weights   04/11/19 0500 04/12/19 0230  Weight: 86.9 kg 86.5 kg    Weight change: -0.4 kg   Hemodynamic parameters for last 24 hours: CVP:  [0 mmHg-7 mmHg] 4 mmHg  Intake/Output from previous day: 02/24 0701 - 02/25 0700 In: 12762.5 [I.V.:662.4; IV Piggyback:100.1] Out: 13005 [PRXYV:85929]  Intake/Output this shift: Total I/O In: 143.4 [I.V.:43.4; IV Piggyback:100] Out: 1300 [Urine:1300]  Current Meds: Scheduled Meds: . sodium chloride   Intravenous Once  . aspirin  81 mg Per Tube Daily  . atorvastatin  80 mg Per Tube q1800  . B-complex with vitamin C  1 tablet Oral Daily  . chlorhexidine gluconate (MEDLINE KIT)  15 mL Mouth Rinse BID  . Chlorhexidine Gluconate Cloth  6 each Topical Daily  . darbepoetin (ARANESP) injection - NON-DIALYSIS  40 mcg Subcutaneous Q Tue-1800  . feeding supplement (NEPRO CARB STEADY)  1,000 mL Per Tube TID WC  . feeding supplement (PRO-STAT SUGAR FREE 64)  60 mL Per Tube BID  . Gerhardt's butt cream   Topical BID  . hydrocortisone   Rectal BID  . insulin aspart  0-24 Units Subcutaneous Q4H  . insulin aspart  5 Units Subcutaneous Q4H  .  insulin glargine  26 Units Subcutaneous Daily  . mouth rinse  15 mL Mouth Rinse 10 times per day  . midodrine  10 mg Oral TID WC  . pantoprazole (PROTONIX) IV  40 mg Intravenous Q24H  . sevelamer carbonate  800 mg Oral TID WC  . sodium chloride flush  10-40 mL Intracatheter Q12H  . tamsulosin  0.4 mg Oral Daily   Continuous Infusions: . sodium chloride    . sodium chloride Stopped (04/04/19 1736)  . sodium chloride Stopped (04/12/19 0721)  . amiodarone 30 mg/hr (04/12/19 0800)  . feeding supplement (VITAL 1.5 CAL)    . meropenem (MERREM) IV    . norepinephrine (LEVOPHED) Adult infusion 4 mcg/min (04/12/19 0800)   PRN Meds:.sodium chloride, oxyCODONE **AND** acetaminophen, dextrose, fentaNYL (SUBLIMAZE) injection, midazolam, ondansetron (ZOFRAN) IV, sodium chloride flush, sodium chloride flush  General appearance: alert, cooperative and no distress Heart: regular rate and rhythm Lungs: some rales Abdomen: soft, nontender Extremities: + edema Wound: incis ok  Lab Results: CBC: Recent Labs    04/11/19 0415 04/12/19 0508  WBC 10.7* 8.8  HGB 8.5* 7.5*  HCT 26.7* 24.1*  PLT 143* 179   BMET:  Recent Labs    04/11/19 1546 04/12/19 0508  NA 137 137  K  5.0 5.1  CL 103 101  CO2 23 22  GLUCOSE 168* 151*  BUN 38* 60*  CREATININE 2.43* 3.49*  CALCIUM 7.5* 7.6*    CMET: Lab Results  Component Value Date   WBC 8.8 04/12/2019   HGB 7.5 (L) 04/12/2019   HCT 24.1 (L) 04/12/2019   PLT 179 04/12/2019   GLUCOSE 151 (H) 04/12/2019   CHOL 117 03/16/2019   TRIG 52 03/16/2019   HDL 33 (L) 03/16/2019   LDLCALC 74 03/16/2019   ALT 25 04/04/2019   AST 39 04/04/2019   NA 137 04/12/2019   K 5.1 04/12/2019   CL 101 04/12/2019   CREATININE 3.49 (H) 04/12/2019   BUN 60 (H) 04/12/2019   CO2 22 04/12/2019   TSH 3.683 03/13/2019   INR 1.7 (H) 03/27/2019   HGBA1C 6.1 (H) 03/13/2019      PT/INR: No results for input(s): LABPROT, INR in the last 72 hours. Radiology: DG Abd  Portable 1V  Result Date: 04/11/2019 CLINICAL DATA:  Enteric catheter placement EXAM: PORTABLE ABDOMEN - 1 VIEW COMPARISON:  03/30/2019 FINDINGS: Frontal view of the lower chest and upper abdomen demonstrates an enteric catheter tip projecting over gastric antrum. Epicardial pacing wires are noted. Postsurgical changes from median sternotomy. Bowel gas pattern is unremarkable. Consolidation and/or effusion at the left lung base slightly improved. IMPRESSION: 1. Enteric catheter overlying gastric antrum. Electronically Signed   By: Jesse Murphy M.D.   On: 04/11/2019 19:02     Assessment/Plan: S/P Procedure(s) (LRB): REMOVAL OF IMPELLA LEFT VENTRICULAR ASSIST DEVICE WITH INSERTION OF RIGHT FEMORAL ARTERIAL LINE (N/A)  1 steady overall progress, on low dose levo, In sinus on IV amio. EF now closer to 40% 2 tol TC- management per PCCM, wean as able 3 nephrology conts to manage renal issues, off CRRT- poss iHD soon 4 hematuria, cont irrigation 5 transfusion this am 6 cont TF's 7 cont therapies   Jesse Giovanni PA-C 04/12/2019 8:39 AM  Pager [phone]  Remains hemodynamically stable after Impella removal with coox .60 in nsr. HR starting to slow so will transition to po amio when feeding tube placed  No fresh blood in stools- remain off heparin. 1 U PRBC today for Hb 7.5. recheck coags  Off CVVH with volume status good- HD planned tomorrow- recheck K this pm  Resume Nepro TF, PM valve to trach and swallow eval Reduce bladder irrigation slowly to 200cc/hr and observe for clots  Surgical incisions clean, dry  patient examined and medical record reviewed,agree with above note. Jesse Murphy 04/12/2019

## 2019-04-13 ENCOUNTER — Inpatient Hospital Stay (HOSPITAL_COMMUNITY): Payer: Medicare Other

## 2019-04-13 LAB — RENAL FUNCTION PANEL
Albumin: 2 g/dL — ABNORMAL LOW (ref 3.5–5.0)
Anion gap: 15 (ref 5–15)
BUN: 95 mg/dL — ABNORMAL HIGH (ref 8–23)
CO2: 20 mmol/L — ABNORMAL LOW (ref 22–32)
Calcium: 7.6 mg/dL — ABNORMAL LOW (ref 8.9–10.3)
Chloride: 99 mmol/L (ref 98–111)
Creatinine, Ser: 4.9 mg/dL — ABNORMAL HIGH (ref 0.61–1.24)
GFR calc Af Amer: 11 mL/min — ABNORMAL LOW (ref >60)
GFR calc non Af Amer: 10 mL/min — ABNORMAL LOW (ref >60)
Glucose, Bld: 145 mg/dL — ABNORMAL HIGH (ref 70–99)
Phosphorus: 8 mg/dL — ABNORMAL HIGH (ref 2.5–4.6)
Potassium: 4.4 mmol/L (ref 3.5–5.1)
Sodium: 134 mmol/L — ABNORMAL LOW (ref 135–145)

## 2019-04-13 LAB — CBC
HCT: 28.5 % — ABNORMAL LOW (ref 39.0–52.0)
Hemoglobin: 9.4 g/dL — ABNORMAL LOW (ref 13.0–17.0)
MCH: 30 pg (ref 26.0–34.0)
MCHC: 33 g/dL (ref 30.0–36.0)
MCV: 91.1 fL (ref 80.0–100.0)
Platelets: 215 10*3/uL (ref 150–400)
RBC: 3.13 MIL/uL — ABNORMAL LOW (ref 4.22–5.81)
RDW: 16.8 % — ABNORMAL HIGH (ref 11.5–15.5)
WBC: 11 10*3/uL — ABNORMAL HIGH (ref 4.0–10.5)
nRBC: 0 % (ref 0.0–0.2)

## 2019-04-13 LAB — BPAM RBC
Blood Product Expiration Date: 202103182359
ISSUE DATE / TIME: 202102250905
Unit Type and Rh: 7300

## 2019-04-13 LAB — HEPATITIS B SURFACE ANTIGEN: Hepatitis B Surface Ag: NONREACTIVE

## 2019-04-13 LAB — COOXEMETRY PANEL
Carboxyhemoglobin: 2 % — ABNORMAL HIGH (ref 0.5–1.5)
Methemoglobin: 1.1 % (ref 0.0–1.5)
O2 Saturation: 79 %
Total hemoglobin: 9.6 g/dL — ABNORMAL LOW (ref 12.0–16.0)

## 2019-04-13 LAB — TYPE AND SCREEN
ABO/RH(D): B POS
Antibody Screen: NEGATIVE
Unit division: 0

## 2019-04-13 LAB — GLUCOSE, CAPILLARY
Glucose-Capillary: 135 mg/dL — ABNORMAL HIGH (ref 70–99)
Glucose-Capillary: 109 mg/dL — ABNORMAL HIGH (ref 70–99)
Glucose-Capillary: 139 mg/dL — ABNORMAL HIGH (ref 70–99)
Glucose-Capillary: 114 mg/dL — ABNORMAL HIGH (ref 70–99)

## 2019-04-13 LAB — MAGNESIUM: Magnesium: 2.6 mg/dL — ABNORMAL HIGH (ref 1.7–2.4)

## 2019-04-13 MED ORDER — SODIUM CHLORIDE 0.9 % IV SOLN: 100.0000 mL | INTRAVENOUS | Status: DC | PRN

## 2019-04-13 MED ORDER — ALTEPLASE 2 MG IJ SOLR
2.0000 mg | Freq: Once | INTRAMUSCULAR | Status: AC | PRN
  Administered 2019-04-16: 2 mg

## 2019-04-13 MED ORDER — AMIODARONE HCL 200 MG PO TABS
200.0000 mg | ORAL_TABLET | Freq: Every day | ORAL | Status: DC
  Administered 2019-04-13 – 2019-04-18 (×6): 200 mg via ORAL
  Filled 2019-04-13 (×6): qty 1

## 2019-04-13 MED ORDER — AMIODARONE IV BOLUS ONLY 150 MG/100ML
150.0000 mg | Freq: Once | INTRAVENOUS | Status: AC
  Administered 2019-04-13: 18:00:00 150 mg via INTRAVENOUS
  Filled 2019-04-13: qty 100

## 2019-04-13 MED ORDER — HYOSCYAMINE SULFATE 0.125 MG PO TABS
0.1250 mg | ORAL_TABLET | Freq: Four times a day (QID) | ORAL | Status: DC | PRN
  Filled 2019-04-13 (×2): qty 1

## 2019-04-13 MED ORDER — ALBUMIN HUMAN 25 % IV SOLN
INTRAVENOUS | Status: AC
  Administered 2019-04-13: 25 g via INTRAVENOUS
  Filled 2019-04-13: qty 200

## 2019-04-13 MED ORDER — HEPARIN SODIUM (PORCINE) 1000 UNIT/ML IJ SOLN
INTRAMUSCULAR | Status: AC
  Filled 2019-04-13: qty 4

## 2019-04-13 MED ORDER — HEPARIN SODIUM (PORCINE) 1000 UNIT/ML DIALYSIS
1000.0000 [IU] | INTRAMUSCULAR | Status: DC | PRN
  Administered 2019-04-19 – 2019-04-23 (×2): 3200 [IU] via INTRAVENOUS_CENTRAL

## 2019-04-13 MED ORDER — HYOSCYAMINE SULFATE 0.125 MG PO TABS
0.1250 mg | ORAL_TABLET | Freq: Four times a day (QID) | ORAL | Status: DC | PRN
  Administered 2019-04-22 – 2019-04-29 (×2): 0.125 mg via ORAL
  Filled 2019-04-13 (×4): qty 1

## 2019-04-13 MED ORDER — HEPARIN SODIUM (PORCINE) 1000 UNIT/ML IJ SOLN
INTRAMUSCULAR | Status: AC
  Administered 2019-04-13: 3200 [IU] via INTRAVENOUS_CENTRAL
  Filled 2019-04-13: qty 4

## 2019-04-13 MED ORDER — LIDOCAINE HCL (PF) 1 % IJ SOLN: 5.0000 mL | INTRAMUSCULAR | Status: DC | PRN

## 2019-04-13 MED ORDER — SODIUM CHLORIDE 0.9 % IV SOLN
20.0000 ug | Freq: Once | INTRAVENOUS | Status: AC
  Administered 2019-04-13: 12:00:00 20 ug via INTRAVENOUS
  Filled 2019-04-13: qty 5

## 2019-04-13 MED ORDER — PENTAFLUOROPROP-TETRAFLUOROETH EX AERO: 1.0000 "application " | INHALATION_SPRAY | CUTANEOUS | Status: DC | PRN

## 2019-04-13 MED ORDER — ALBUMIN HUMAN 25 % IV SOLN: 25.0000 g | Freq: Once | INTRAVENOUS | Status: AC

## 2019-04-13 MED ORDER — LIDOCAINE-PRILOCAINE 2.5-2.5 % EX CREA
1.0000 "application " | TOPICAL_CREAM | CUTANEOUS | Status: DC | PRN
  Filled 2019-04-13: qty 5

## 2019-04-13 MED FILL — Sodium Chloride IV Soln 0.9%: INTRAVENOUS | Qty: 2000 | Status: AC

## 2019-04-13 MED FILL — Heparin Sodium (Porcine) Inj 1000 Unit/ML: INTRAMUSCULAR | Qty: 30 | Status: AC

## 2019-04-13 NOTE — Progress Notes (Addendum)
TCTS DAILY ICU PROGRESS NOTE                   [place]            [place] [address]          [phone]   3 Days Post-Op Procedure(s) (LRB): REMOVAL OF IMPELLA LEFT VENTRICULAR ASSIST DEVICE WITH INSERTION OF RIGHT FEMORAL ARTERIAL LINE (N/A)  Total Length of Stay:  LOS: 30 days   Subjective: Significant hematuria with clots requiring frequent irrigation  Objective: Vital signs in last 24 hours: Temp:  [97.7 F (36.5 C)-98.2 F (36.8 C)] 97.9 F (36.6 C) (02/26 0800) Pulse Rate:  [74-89] 80 (02/26 0719) Cardiac Rhythm: Normal sinus rhythm (02/26 0400) Resp:  [9-32] 20 (02/26 0719) BP: (80-137)/(30-46) 106/43 (02/26 0719) SpO2:  [90 %-96 %] 94 % (02/26 0719) Arterial Line BP: (99)/(40) 99/40 (02/25 1200) FiO2 (%):  [28 %] 28 % (02/26 0719) Weight:  [89.8 kg] 89.8 kg (02/26 0600)  Filed Weights   04/11/19 0500 04/12/19 0230 04/13/19 0600  Weight: 86.9 kg 86.5 kg 89.8 kg    Weight change: 3.3 kg   Hemodynamic parameters for last 24 hours: CVP:  [4 mmHg-5 mmHg] 5 mmHg  Intake/Output from previous day: 02/25 0701 - 02/26 0700 In: 22119.7 [I.V.:543.7; Blood:356; NG/GT:80; IV Piggyback:100] Out: 21180 [Urine:21150; Drains:30]  Intake/Output this shift: No intake/output data recorded.  Current Meds: Scheduled Meds: . sodium chloride   Intravenous Once  . amiodarone  200 mg Oral Daily  . aspirin  81 mg Per Tube Daily  . atorvastatin  80 mg Per Tube q1800  . chlorhexidine gluconate (MEDLINE KIT)  15 mL Mouth Rinse BID  . Chlorhexidine Gluconate Cloth  6 each Topical Daily  . Chlorhexidine Gluconate Cloth  6 each Topical Q0600  . darbepoetin (ARANESP) injection - NON-DIALYSIS  40 mcg Subcutaneous Q Tue-1800  . feeding supplement (NEPRO CARB STEADY)  237 mL Oral TID BM  . feeding supplement (PRO-STAT SUGAR FREE 64)  30 mL Oral BID  . Gerhardt's butt cream   Topical BID  . hydrocortisone   Rectal BID  . insulin aspart  0-24 Units Subcutaneous  Q4H  . insulin aspart  5 Units Subcutaneous Q4H  . insulin glargine  26 Units Subcutaneous Daily  . mouth rinse  15 mL Mouth Rinse 10 times per day  . midodrine  10 mg Oral TID WC  . multivitamin  1 tablet Oral QHS  . pantoprazole (PROTONIX) IV  40 mg Intravenous Q24H  . sevelamer carbonate  800 mg Oral TID WC  . sodium chloride flush  10-40 mL Intracatheter Q12H  . tamsulosin  0.4 mg Oral Daily   Continuous Infusions: . sodium chloride    . sodium chloride Stopped (04/04/19 1736)  . sodium chloride Stopped (04/12/19 0721)  . sodium chloride    . sodium chloride    . albumin human    . meropenem (MERREM) IV    . norepinephrine (LEVOPHED) Adult infusion 6 mcg/min (04/12/19 1900)   PRN Meds:.sodium chloride, sodium chloride, sodium chloride, oxyCODONE **AND** acetaminophen, alteplase, dextrose, fentaNYL (SUBLIMAZE) injection, heparin, lidocaine (PF), lidocaine-prilocaine, midazolam, ondansetron (ZOFRAN) IV, pentafluoroprop-tetrafluoroeth, Resource ThickenUp Clear, sodium chloride flush, sodium chloride flush  General appearance: alert, cooperative and no distress Heart: regular rate and rhythm Lungs: mildly dim left base Abdomen: + tenderness over bladder Extremities: + edema Wound: incis healing well  Lab Results: CBC: Recent Labs    04/12/19 1351 04/13/19 0427  WBC 11.9* 11.0*  HGB 9.7* 9.4*  HCT 29.2* 28.5*  PLT 217 215   BMET:  Recent Labs    04/12/19 1351 04/13/19 0427  NA 135 134*  K 4.7 4.4  CL 99 99  CO2 20* 20*  GLUCOSE 201* 145*  BUN 73* 95*  CREATININE 3.99* 4.90*  CALCIUM 7.5* 7.6*    CMET: Lab Results  Component Value Date   WBC 11.0 (H) 04/13/2019   HGB 9.4 (L) 04/13/2019   HCT 28.5 (L) 04/13/2019   PLT 215 04/13/2019   GLUCOSE 145 (H) 04/13/2019   CHOL 117 03/16/2019   TRIG 52 03/16/2019   HDL 33 (L) 03/16/2019   LDLCALC 74 03/16/2019   ALT 25 04/04/2019   AST 39 04/04/2019   NA 134 (L) 04/13/2019   K 4.4 04/13/2019   CL 99  04/13/2019   CREATININE 4.90 (H) 04/13/2019   BUN 95 (H) 04/13/2019   CO2 20 (L) 04/13/2019   TSH 3.683 03/13/2019   INR 1.7 (H) 03/27/2019   HGBA1C 6.1 (H) 03/13/2019      PT/INR: No results for input(s): LABPROT, INR in the last 72 hours. Radiology: [facility] Chest Port 1 View  Result Date: 04/13/2019 CLINICAL DATA:  Status post coronary artery bypass graft. EXAM: PORTABLE CHEST 1 VIEW COMPARISON:  April 12, 2019. FINDINGS: Stable cardiomediastinal silhouette. Tracheostomy tube is unchanged in position. Left subclavian catheter is unchanged. Right-sided PICC line is unchanged. No pneumothorax is noted. Right lung is clear. Mild left pleural effusion is noted with associated atelectasis. Bony thorax is unremarkable. IMPRESSION: Stable support apparatus. Mild left pleural effusion is noted with associated atelectasis. Electronically Signed   By: Marijo Conception M.D.   On: 04/13/2019 08:57     Assessment/Plan: S/P Procedure(s) (LRB): REMOVAL OF IMPELLA LEFT VENTRICULAR ASSIST DEVICE WITH INSERTION OF RIGHT FEMORAL ARTERIAL LINE (N/A)  1 stable hemodynamics on low dose levo- CoOx is 79- wean as able, as per AHF team 2 hematuria with frequent irrigation- worse, may need cystoscopy, will need urology to recheck patient- DDAVP ordered 3 poss HD if stable enough- nephrology managing 4 only minor leukocytosis, no fevers- on Merrem 5 H/H pretty stable, cont to monitor closely 6 using PMV- pulm/trach management per [facility] E Gold 04/13/2019 9:22 AM   Alert, nsr, stable BP stable   Discomfort from bladder clots still forming despite cont irrigation, stopping heparin, plt count normal Will give dose DDAVP and CT pelvis ordered - no  contrast Appreciate urology input - will see today   sched for HD in ICU today  patient examined and medical record reviewed,agree with above note. Tharon Aquas Trigt III 04/13/2019

## 2019-04-13 NOTE — Progress Notes (Signed)
SLP Cancellation Note  Patient Details Name: Jesse Murphy MRN: [id number] DOB: 03-Jan-1929   Cancelled treatment:       Reason Eval/Treat Not Completed: Patient at procedure or test/unavailable (Pt having dialysis at this time and RN requested that p.o. be deferred at this time. SLP will follow up on subsequent date.)  [name] I. [name], [place], [place] Office number [phone] Pager [place] 04/13/2019, 2:37 PM

## 2019-04-13 NOTE — Progress Notes (Addendum)
Patient ID: Jesse Murphy, male   DOB: 05-12-1928, 86 y.o.   MRN: [id number]    Advanced Heart Failure Rounding Note  PCP-Cardiologist: Kate Sable, MD   Subjective:    Impella removed 2/23. Intraoperative TEE showed EF ~40%  Remains on trach collar.    On NE 2 mcg + midodrine 10 tid. Co-ox 79%.   CCRT stopped 2/24 after clotting.   SCr 4.9. Foley clotted.   Unable to irrigate.   hgb back down to 9.4    Denies SOB.    Objective:   Weight Range: 89.8 kg Body mass index is 30.1 kg/m.   Vital Signs:   Temp:  [97.7 F (36.5 C)-98.1 F (36.7 C)] 97.9 F (36.6 C) (02/26 0800) Pulse Rate:  [74-89] 80 (02/26 0719) Resp:  [9-32] 20 (02/26 0719) BP: (80-137)/(30-46) 106/43 (02/26 0719) SpO2:  [90 %-96 %] 94 % (02/26 0719) Arterial Line BP: (99)/(40) 99/40 (02/25 1200) FiO2 (%):  [28 %] 28 % (02/26 0719) Weight:  [89.8 kg] 89.8 kg (02/26 0600) Last BM Date: 04/12/19  Weight change: Filed Weights   04/11/19 0500 04/12/19 0230 04/13/19 0600  Weight: 86.9 kg 86.5 kg 89.8 kg    Intake/Output:   Intake/Output Summary (Last 24 hours) at 04/13/2019 1054 Last data filed at 04/13/2019 0600 Gross per 24 hour  Intake 20283.63 ml  Output 18965 ml  Net 1318.63 ml      Physical Exam   General:  Trach  HEENT: Trach  Neck: supple. no JVD. Carotids 2+ bilat; no bruits. No lymphadenopathy or thryomegaly appreciated. Cor: PMI nondisplaced. Regular rate & rhythm. No rubs, gallops or murmurs. Lungs: clear Abdomen: soft, nontender, nondistended. No hepatosplenomegaly. No bruits or masses. Good bowel sounds. Extremities: no cyanosis, clubbing, rash, R and LLE 2+ edema Neuro: alert & orientedx3, cranial nerves grossly intact. moves all 4 extremities w/o difficulty.  GU: Foley - clotted.    Telemetry  NSR 70-80s   Labs    CBC Recent Labs    04/12/19 1351 04/13/19 0427  WBC 11.9* 11.0*  HGB 9.7* 9.4*  HCT 29.2* 28.5*  MCV 91.0 91.1  PLT 217 185   Basic  Metabolic Panel Recent Labs    04/12/19 0508 04/12/19 0508 04/12/19 1351 04/13/19 0427  NA 137   < > 135 134*  K 5.1   < > 4.7 4.4  CL 101   < > 99 99  CO2 22   < > 20* 20*  GLUCOSE 151*   < > 201* 145*  BUN 60*   < > 73* 95*  CREATININE 3.49*   < > 3.99* 4.90*  CALCIUM 7.6*   < > 7.5* 7.6*  MG 2.4  --   --  2.6*  PHOS 6.0*  --   --  8.0*   < > = values in this interval not displayed.   Liver Function Tests Recent Labs    04/12/19 0508 04/13/19 0427  ALBUMIN 1.9* 2.0*   No results for input(s): LIPASE, AMYLASE in the last 72 hours. Cardiac Enzymes No results for input(s): CKTOTAL, CKMB, CKMBINDEX, TROPONINI in the last 72 hours.  BNP: BNP (last 3 results) Recent Labs    03/22/19 0401 03/26/19 0626 03/27/19 1255  BNP 340.4* 687.5* 800.4*    ProBNP (last 3 results) No results for input(s): PROBNP in the last 8760 hours.   D-Dimer No results for input(s): DDIMER in the last 72 hours. Hemoglobin A1C No results for input(s): HGBA1C in the last  72 hours. Fasting Lipid Panel No results for input(s): CHOL, HDL, LDLCALC, TRIG, CHOLHDL, LDLDIRECT in the last 72 hours. Thyroid Function Tests No results for input(s): TSH, T4TOTAL, T3FREE, THYROIDAB in the last 72 hours.  Invalid input(s): FREET3  Other results:   Imaging    DG Chest Port 1 View  Result Date: 04/13/2019 CLINICAL DATA:  Status post coronary artery bypass graft. EXAM: PORTABLE CHEST 1 VIEW COMPARISON:  April 12, 2019. FINDINGS: Stable cardiomediastinal silhouette. Tracheostomy tube is unchanged in position. Left subclavian catheter is unchanged. Right-sided PICC line is unchanged. No pneumothorax is noted. Right lung is clear. Mild left pleural effusion is noted with associated atelectasis. Bony thorax is unremarkable. IMPRESSION: Stable support apparatus. Mild left pleural effusion is noted with associated atelectasis. Electronically Signed   By: Marijo Conception M.D.   On: 04/13/2019 08:57      Medications:     Scheduled Medications: . sodium chloride   Intravenous Once  . amiodarone  200 mg Oral Daily  . aspirin  81 mg Per Tube Daily  . atorvastatin  80 mg Per Tube q1800  . chlorhexidine gluconate (MEDLINE KIT)  15 mL Mouth Rinse BID  . Chlorhexidine Gluconate Cloth  6 each Topical Daily  . Chlorhexidine Gluconate Cloth  6 each Topical Q0600  . darbepoetin (ARANESP) injection - NON-DIALYSIS  40 mcg Subcutaneous Q Tue-1800  . feeding supplement (NEPRO CARB STEADY)  237 mL Oral TID BM  . feeding supplement (PRO-STAT SUGAR FREE 64)  30 mL Oral BID  . Gerhardt's butt cream   Topical BID  . hydrocortisone   Rectal BID  . insulin aspart  0-24 Units Subcutaneous Q4H  . insulin aspart  5 Units Subcutaneous Q4H  . insulin glargine  26 Units Subcutaneous Daily  . mouth rinse  15 mL Mouth Rinse 10 times per day  . midodrine  10 mg Oral TID WC  . multivitamin  1 tablet Oral QHS  . pantoprazole (PROTONIX) IV  40 mg Intravenous Q24H  . sevelamer carbonate  800 mg Oral TID WC  . sodium chloride flush  10-40 mL Intracatheter Q12H  . tamsulosin  0.4 mg Oral Daily    Infusions: . sodium chloride    . sodium chloride Stopped (04/04/19 1736)  . sodium chloride Stopped (04/12/19 0721)  . sodium chloride    . sodium chloride    . albumin human    . desmopressin (DDAVP) IV    . meropenem (MERREM) IV    . norepinephrine (LEVOPHED) Adult infusion 6 mcg/min (04/12/19 1900)    PRN Medications: sodium chloride, sodium chloride, sodium chloride, oxyCODONE **AND** acetaminophen, alteplase, dextrose, fentaNYL (SUBLIMAZE) injection, heparin, lidocaine (PF), lidocaine-prilocaine, midazolam, ondansetron (ZOFRAN) IV, pentafluoroprop-tetrafluoroeth, Resource ThickenUp Clear, sodium chloride flush, sodium chloride flush    Assessment/Plan   1. Acute systolic HF -> Cardiogenic shock - post-op echo on 03/20/19 EF 25-30% (pre-op 30-35%. In 12/2017 EF normal) -Required Impella support post  CABG. Impella removed 2/23 - Off VP. Norepi 2 mcg +midodrine 10 tid. CO-OX 79%.  - off CCRT, stopped 2/24 after clotting. Hard to tell if making urine with CBI.  - Family seems most interested in continuing with aggressive care though Mr. Wohler at times seems to favor switching to comfort care though he has not voiced this definitively.  - For now, we will continue aggressive therapy. So far, doing ok off impella. Will see how he does off CCRT.   2. CAD s/p CABG this admit (LIMA->LAD, SVG->OM, SVG->RCA  on 03/15/19) - No angina - Continue ASA +statin  3. Acute hypoxic respiratory failure with Pseudomonas PNA - s/p trach on 03/27/19 - on TC - on meropenem for pseudomonas PNA  4. PAF - s/p MAZE and LAA occlusion - In NSR. On amio 200 mg twice a day.  - Off systemic heparin with hematuria and melena/ anemia.   5. AKI  - due to shock/ATN - Creatinine peaked at 5.04 - Started [facility] 03/29/19 per Nephrology. Stopped 2/24 due to clotting - Foley clotted.    6. Hematuria  -Off heparin drip.  - Continue CBI     - Urology following.   7. Melena - No melena overnight.   8. Anemia -Hematuria/melena. -Hgb 9.4  today. Transfuse x 1 unit   9. Debility, severe - continue PT - get OOB and in chair today  10. Hyperkalemia - in the setting of renal failure. Now off CRRT - K 4.4. today. Continue loklema as needed    Darrick Grinder, NP  10:54 AM   Agree.   He remains on NE at 6 and midodrine 10 tid. SBP stable. Co-ox ok. On trach collar. Off CVVHD but BUN/creatinine rising quickly. Not uremic. Major issue this am is severe bleeding from bladder with clots in Foley.  CT relatively unremarkable  General:  Awake on TC HEENT: normal Neck: supple. JVP 7-8 + TC Carotids 2+ bilat; no bruits. No lymphadenopathy or thryomegaly appreciated. Cor: PMI nondisplaced. Regular rate & rhythm. No rubs, gallops or murmurs. [place] trialysis. Sternal wound ok Lungs: clear Abdomen: soft, nontender, nondistended. No  hepatosplenomegaly. No bruits or masses. Good bowel sounds. Extremities: no cyanosis, clubbing, rash, 2+ edema  Foley with TBI and blood Neuro: awake following commands  Making progress. Remains on low-dose NE. Continue to wean as tolerated. Unable to gauge u/o due to bladder irrigation but BUN/Cr rising quickly and now more edematous. Suspect he will need dialysis today or tomorrow. Renal will attempt iHD. Remains in NSR on amio.   Urology has been consulted for cystoscopy. Continue to hold heparin. D/w Dr. Prescott Gum.   CRITICAL CARE Performed by: Glori Bickers  Total critical care time: 35 minutes  Critical care time was exclusive of separately billable procedures and treating other patients.  Critical care was necessary to treat or prevent imminent or life-threatening deterioration.  Critical care was time spent personally by me (independent of midlevel providers or residents) on the following activities: development of treatment plan with patient and/or surrogate as well as nursing, discussions with consultants, evaluation of patient's response to treatment, examination of patient, obtaining history from patient or surrogate, ordering and performing treatments and interventions, ordering and review of laboratory studies, ordering and review of radiographic studies, pulse oximetry and re-evaluation of patient's condition.  Glori Bickers, MD  12:55 PM

## 2019-04-13 NOTE — Progress Notes (Signed)
NAME:  Jesse Murphy, MRN:  [id number], DOB:  Jul 04, 1928, LOS: 67 ADMISSION DATE:  03/13/2019, CONSULTATION DATE:  2/2 REFERRING MD:  Nils Pyle, CHIEF COMPLAINT:  Dyspnea   Brief History   86 y/o male admitted 1/26, underwent CABG and L atrial appendage clipping on 1/29, moved to ICU on 2/1 for intubation.  Past Medical History  A fib, CAD, Prostate cancer, HTN  Significant Hospital Events   1/29 CABG 2/01 to ICU 2/05 Extubated 2/08 Reintubated 2/09 tracheostomy 2/09 Impella, PA catheter placed 2/11 CRRT initiated 2/16 leukocytosis, add vancomycin 2/17 start TC trials 2/19 urology placed 3 way foley, bleeding at rectal tube site 2/21 ATC initiated  2/22 1 unit PRBC 2/23 1 unit PRBC  Consults:  Nephrology CHF team Urology  Procedures:  Rt PICC 2/02 >> Trach 2/09 >> PA catheter 2/09 >> Lt [place] HD cath 2/11 >> L Rad Aline 2/3 >>  Impella 2/10 >> 2/23  Significant Diagnostic Tests:  Echo 1/26 >> EF 30 to 35%  Micro Data:  SARS CoV2 PCR 1/26 >> negative Influenza PCR 1/26 >> negative Sputum 2/01 >> oral flora Sputum 2/08 >> Pseudomonas >> pan sensitive  Blood 2/16 >> negative Sputum 2/16 >> Pseudomonas sens to cipro, gent, imepenem BCx2 2/20 >> NG at 4 days  Antimicrobials:  Rocephin 2/01 >> 2/07 Cefepime 2/08 >> 2/10 Fortaz 2/10 >> 2/16 Vancomycin 2/16 >> 2/19 Meropenem 2/20 >> 2/26  Interim history/subjective:  This morning pt had significant hematuria with clots requiring frequent irrigation.  Patient is still complaining of 10/10 penile pain.  Objective   Blood pressure (!) 92/42, pulse 80, temperature 97.9 F (36.6 C), temperature source Oral, resp. rate (!) 21, height 5\' 8"  (1.727 m), weight 89.8 kg, SpO2 95 %. CVP:  [5 mmHg] 5 mmHg  FiO2 (%):  [28 %] 28 %   Intake/Output Summary (Last 24 hours) at 04/13/2019 1248 Last data filed at 04/13/2019 1100 Gross per 24 hour  Intake 21501.94 ml  Output 17415 ml  Net 4086.94 ml   Filed Weights   04/11/19  0500 04/12/19 0230 04/13/19 0600  Weight: 86.9 kg 86.5 kg 89.8 kg    Examination: General:  Elderly male lying in bed in NAD HEENT: trach in place Neuro: Awake, alert, interacts appropriately CV: RRR PULM:  CTAB GI: soft, non-tender, non-distended Extremities: warm/dry, mild-mod edema LEs, significant edema L hand Skin: bruising  Resolved Hospital Problem list     Assessment & Plan:   Acute Hypoxemic Respiratory Failure due to Acute pulmonary edema complicated by Pseudomonas HCAP. Small Left Pleural Post-pericardiotomy Effusion  -open ended trach collar as tolerated  -if new fatigue, rest on PRVC / PSV intermittently  -follow intermittent CXR  -last day meropenem (2/20 - 2/26)  Cardiogenic Shock with acute systolic heart failure. New onset atrial fibrillation >> back in sinus rhythm. -Wean NE to off -continue ASA, lipitor, mododrine, amiodarone  -heparin gtt on hold in setting of hematuria  Persistent Hematuria with retained clots - Urology consult - CT abdomen/pelvis  AKI secondary to Cardiogenic Shock. CRRT clotted off 2/24. - HD  Anemia of critical illness. -trend CBC  -transfuse for Hgb <7% or significant bleeding   Rectal bleeding Noted 2/19 Suspect this is related to irritation from flexi-seal.  No further bleeding since removal.   -Follow clinically  DM2 with hyperglycemia, poorly controlled -SSI  -TF coverage 5 units Q4, hold while NPO -lantus 26 units QD  Pressure wounds / Reported as Skin Tear 2/15 -WOC following,  appreciate input    Best practice:  Diet: Dysphagia 1 diet DVT prophylaxis: SCD's GI prophylaxis: PPI Mobility: bed rest Code Status: Partial Code  Disposition: ICU  Labs    CMP Latest Ref Rng & Units 04/13/2019 04/12/2019 04/12/2019  Glucose 70 - 99 mg/dL 145(H) 201(H) 151(H)  BUN 8 - 23 mg/dL 95(H) 73(H) 60(H)  Creatinine 0.61 - 1.24 mg/dL 4.90(H) 3.99(H) 3.49(H)  Sodium 135 - 145 mmol/L 134(L) 135 137  Potassium 3.5 - 5.1  mmol/L 4.4 4.7 5.1  Chloride 98 - 111 mmol/L 99 99 101  CO2 22 - 32 mmol/L 20(L) 20(L) 22  Calcium 8.9 - 10.3 mg/dL 7.6(L) 7.5(L) 7.6(L)  Total Protein 6.5 - 8.1 g/dL - - -  Total Bilirubin 0.3 - 1.2 mg/dL - - -  Alkaline Phos 38 - 126 U/L - - -  AST 15 - 41 U/L - - -  ALT 0 - 44 U/L - - -    CBC Latest Ref Rng & Units 04/13/2019 04/12/2019 04/12/2019  WBC 4.0 - 10.5 K/uL 11.0(H) 11.9(H) 8.8  Hemoglobin 13.0 - 17.0 g/dL 9.4(L) 9.7(L) 7.5(L)  Hematocrit 39.0 - 52.0 % 28.5(L) 29.2(L) 24.1(L)  Platelets 150 - 400 K/uL 215 217 179    ABG    Component Value Date/Time   PHART 7.488 (H) 04/07/2019 0405   PCO2ART 34.4 04/07/2019 0405   PO2ART 101.0 04/07/2019 0405   HCO3 25.9 04/07/2019 0405   TCO2 27 04/07/2019 0405   ACIDBASEDEF 1.0 04/05/2019 0540   O2SAT 79.0 04/13/2019 0427    CBG (last 3)  Recent Labs    04/12/19 2356 04/13/19 0434 04/13/19 0827  GLUCAP 89 135* 109*    CRITICAL CARE Performed by: Mallie Darting, MS4   Total critical care time: 45 minutes  Critical care time was exclusive of separately billable procedures and treating other patients.  Critical care was necessary to treat or prevent imminent or life-threatening deterioration.  Critical care was time spent personally by me on the following activities: development of treatment plan with patient and/or surrogate as well as nursing, discussions with consultants, evaluation of patient's response to treatment, examination of patient, obtaining history from patient or surrogate, ordering and performing treatments and interventions, ordering and review of laboratory studies, ordering and review of radiographic studies, pulse oximetry, re-evaluation of patient's condition and participation in multidisciplinary rounds.

## 2019-04-13 NOTE — Progress Notes (Signed)
Called to assess clot in catheter Was doing well for many days on CBI - pretty clear Hb stable Today: gross hematuria and extensive irrigation by nurses for clots - approximately 2-300 ML Last urine c/s normal CT today after irrigation: some debris floor of bladder about size of foley balloon I irrigated few clots/pretty clear Belly flat and feels comfortable Spoke to nursing CBI rate and prn irrigation Not a good surgical solution in many cases - watch daily will follow Send urine for c/s

## 2019-04-13 NOTE — Progress Notes (Signed)
Nephrology Progress Note:  Patient ID: Jesse Murphy, male   DOB: 1928-12-09, 86 y.o.   MRN: [id number]   S:  Ins/outs not accurate due to bladder irrigation.  Previously  They unable to stop bladder irrigation as patient has recurrent clots and is uncomfortable when they have attempted per nursing.  CVP is 6.  Levo is at 7.  (was inc overnight while sleeping to max of 8 - they are weaning down)   Review of systems:    Denies air hunger  Denies n/v Still getting irrigation   O:BP (!) 126/43   Pulse 78   Temp 98.1 F (36.7 C) (Oral)   Resp 20   Ht _0  (1.727 m)   Wt 86.5 kg   SpO2 93%   BMI 29.00 kg/m   Intake/Output Summary (Last 24 hours) at 04/13/2019 0528 Last data filed at 04/13/2019 0447 Gross per 24 hour  Intake 19091.13 ml  Output 22165 ml  Net -3073.87 ml   Intake/Output: I/O last 3 completed shifts: In: 19622.2 [I.V.:946.2; Blood:356; OEUMP:53614; NG/GT:80; IV Piggyback:200.1] Out: 20020 [ERXVQ:00867; Drains:15; Other:452]  Intake/Output this shift:  Total I/O In: 9211.9 [I.V.:211.9; Other:9000] Out: 12350 [Urine:12350] Weight change:    Physical exam:  Gen: elderly male in bed chronically ill-appearing HEENT trach in place; on vent  CVS: S1 S2 no rub Resp: clearar; unlabored at rest; trach collar Abd: soft/ NT/ND  Ext: 1+ pedal edema Neuro - awake and interactive and follows commands, nods Psych - no anxiety or agitation  GU foley in place with hematuria/irrigation Access: left chest nontunneled dialysis catheter   Recent Labs  Lab 04/09/19 1505 04/09/19 1505 04/10/19 0345 04/10/19 1512 04/11/19 0415 04/11/19 1546 04/12/19 0508 04/12/19 1351 04/13/19 0427  NA 138   < > 138 134* 135 137 137 135 134*  K 4.8   < > 4.8 5.0 5.7* 5.0 5.1 4.7 4.4  CL 104   < > 104 102 104 103 101 99 99  CO2 25   < > 27 22 20* 23 22 20* 20*  GLUCOSE 153*   < > 108* 167* 95 168* 151* 201* 145*  BUN 34*   < > 31* 40* 54* 38* 60* 73* 95*  CREATININE 1.77*   < > 1.66*  2.25* 3.25* 2.43* 3.49* 3.99* 4.90*  ALBUMIN 2.2*  --  2.2* 2.2* 2.0* 2.0* 1.9*  --  2.0*  CALCIUM 7.4*   < > 7.8* 7.3* 7.7* 7.5* 7.6* 7.5* 7.6*  PHOS 3.2  --  2.6 3.1 5.2* 3.9 6.0*  --  8.0*   < > = values in this interval not displayed.   Liver Function Tests: Recent Labs  Lab 04/11/19 1546 04/12/19 0508 04/13/19 0427  ALBUMIN 2.0* 1.9* 2.0*    CBC: Recent Labs  Lab 04/06/19 1233 04/07/19 0405 04/10/19 1005 04/10/19 1005 04/11/19 0415 04/11/19 0415 04/12/19 0508 04/12/19 1351 04/13/19 0427  WBC 16.6*   < > 11.6*   < > 10.7*   < > 8.8 11.9* 11.0*  NEUTROABS 13.7*  --   --   --   --   --   --   --   --   HGB 8.4*   < > 8.5*   < > 8.5*   < > 7.5* 9.7* 9.4*  HCT 26.0*   < > 26.6*   < > 26.7*   < > 24.1* 29.2* 28.5*  MCV 91.5   < > 93.3  --  92.4  --  94.5 91.0 91.1  PLT 74*   < > 136*   < > 143*   < > 179 217 215   < > = values in this interval not displayed.   Cardiac Enzymes: No results for input(s): CKTOTAL, CKMB, CKMBINDEX, TROPONINI in the last 168 hours. CBG: Recent Labs  Lab 04/12/19 1140 04/12/19 1521 04/12/19 2059 04/12/19 2356 04/13/19 0434  GLUCAP 149* 148* 127* 89 135*   IStudies/Results: DG Chest Port 1 View  Result Date: 04/12/2019 CLINICAL DATA:  Tracheostomy, post CABG, hypertension, prostate cancer EXAM: PORTABLE CHEST 1 VIEW COMPARISON:  Portable exam 0549 hours compared to 04/11/2019 FINDINGS: Tracheostomy tube, nasogastric tube, RIGHT arm PICC line, and LEFT jugular dual-lumen central venous catheter unchanged. Upper normal size of cardiac silhouette post CABG and LEFT atrial appendage clipping. Scattered interstitial infiltrates in the perihilar regions slightly greater on LEFT favor mild pulmonary edema. Atelectasis versus consolidation LEFT lower lobe with small associated LEFT pleural effusion. No pneumothorax. IMPRESSION: Mild pulmonary edema with atelectasis versus consolidation in LEFT lower lobe and small LEFT pleural effusion.  Electronically Signed   By: Lavonia Dana M.D.   On: 04/12/2019 09:19   DG CHEST PORT 1 VIEW  Result Date: 04/11/2019 CLINICAL DATA:  Respiratory failure EXAM: PORTABLE CHEST 1 VIEW COMPARISON:  04/10/2019 FINDINGS: Stable positioning of tracheostomy tube. Interval removal of enteric tube. Right-sided PICC line terminates at the level of the distal SVC. Impella device has been removed. Left subclavian central venous catheter stable terminating at the level of the SVC. Prior CABG and atrial appendage clip. Stable cardiomediastinal contours. Persistent opacity at the left lung base likely effusion and associated atelectasis. Right lung appears clear. No pneumothorax. IMPRESSION: 1. Interval removal of enteric tube and Impella device. 2. Persistent left basilar opacity likely representing effusion and associated atelectasis. Electronically Signed   By: Davina Poke D.O.   On: 04/11/2019 09:44   DG Abd Portable 1V  Result Date: 04/11/2019 CLINICAL DATA:  Enteric catheter placement EXAM: PORTABLE ABDOMEN - 1 VIEW COMPARISON:  03/30/2019 FINDINGS: Frontal view of the lower chest and upper abdomen demonstrates an enteric catheter tip projecting over gastric antrum. Epicardial pacing wires are noted. Postsurgical changes from median sternotomy. Bowel gas pattern is unremarkable. Consolidation and/or effusion at the left lung base slightly improved. IMPRESSION: 1. Enteric catheter overlying gastric antrum. Electronically Signed   By: Randa Ngo M.D.   On: 04/11/2019 19:02   . sodium chloride   Intravenous Once  . aspirin  81 mg Per Tube Daily  . atorvastatin  80 mg Per Tube q1800  . chlorhexidine gluconate (MEDLINE KIT)  15 mL Mouth Rinse BID  . Chlorhexidine Gluconate Cloth  6 each Topical Daily  . Chlorhexidine Gluconate Cloth  6 each Topical Q0600  . darbepoetin (ARANESP) injection - NON-DIALYSIS  40 mcg Subcutaneous Q Tue-1800  . feeding supplement (NEPRO CARB STEADY)  237 mL Oral TID BM  .  feeding supplement (PRO-STAT SUGAR FREE 64)  30 mL Oral BID  . Gerhardt's butt cream   Topical BID  . hydrocortisone   Rectal BID  . insulin aspart  0-24 Units Subcutaneous Q4H  . insulin aspart  5 Units Subcutaneous Q4H  . insulin glargine  26 Units Subcutaneous Daily  . mouth rinse  15 mL Mouth Rinse 10 times per day  . midodrine  10 mg Oral TID WC  . multivitamin  1 tablet Oral QHS  . pantoprazole (PROTONIX) IV  40 mg Intravenous Q24H  . sevelamer  carbonate  800 mg Oral TID WC  . sodium chloride flush  10-40 mL Intracatheter Q12H  . tamsulosin  0.4 mg Oral Daily    BMET    Component Value Date/Time   NA 134 (L) 04/13/2019 0427   K 4.4 04/13/2019 0427   CL 99 04/13/2019 0427   CO2 20 (L) 04/13/2019 0427   GLUCOSE 145 (H) 04/13/2019 0427   BUN 95 (H) 04/13/2019 0427   CREATININE 4.90 (H) 04/13/2019 0427   CALCIUM 7.6 (L) 04/13/2019 0427   GFRNONAA 10 (L) 04/13/2019 0427   GFRAA 11 (L) 04/13/2019 0427   CBC    Component Value Date/Time   WBC 11.0 (H) 04/13/2019 0427   RBC 3.13 (L) 04/13/2019 0427   HGB 9.4 (L) 04/13/2019 0427   HCT 28.5 (L) 04/13/2019 0427   PLT 215 04/13/2019 0427   MCV 91.1 04/13/2019 0427   MCH 30.0 04/13/2019 0427   MCHC 33.0 04/13/2019 0427   RDW 16.8 (H) 04/13/2019 0427   LYMPHSABS 0.5 (L) 04/06/2019 1233   MONOABS 1.9 (H) 04/06/2019 1233   EOSABS 0.2 04/06/2019 1233   BASOSABS 0.0 04/06/2019 1233    Assessment/Plan:  1. AKI(Cr was 0.7 prior to surgery)- due to postoperative CHF/cardiogenic shock that was refractory to diuretics and started on CRRT on 03/29/19 for volume removal.  CRRT off on 2/23 - discussion with advanced CHF.  CRRT restarted 2/24 then off after clotting 1. Attempt transition to iHD today, 2/26.  For clearance - minimal fluid removal.  Assess needs daily   2. Per advanced CHF goal CVP of 5-8 while not having to increase pressors 2. Acute systolic CHF/cardiogenic shock- EF 25-30%; s/p Impella. optimize volume as above   3. CAD s/p CABG on 1/83/35 complicated by cardiogenic shock and ARF. 4. Acute hypoxic respiratory failure with pseudomonas pneumonia- s/p trach on 03/27/19. abx per PCCM 5. PAF- s/p MAZE and LAA occlusion- therapy per CHF team 6. Hematuria- bladder irrigation 7. ABLA- Hx Melena. Transfuse as needed. PRBC's on 2/22 and 2/23. And 2/25.  Gave aranesp 2/23 40 mcg.  8. Protein malnutrition- moderate to severe 9. Secondary hyperparathyroidism/bone mineral -  On renvela.  Check intact PTH. 10. Disposition- He is a poor longterm dialysis candidate given his cardiogenic shock and trach.  Claudia Desanctis, MD 04/13/2019 5:42 AM

## 2019-04-13 NOTE — Progress Notes (Signed)
Patient ID: Jesse Murphy, male   DOB: [birth year], 86 y.o.   MRN: [id number] TCTS Evening Rounds:  He had HD today but had to increase NE from 2 mcg to 12 mcg.  Went back into rapid atrial fib and giving amio bolus.  Stable on trach collar 95%  Taking PO well per nurses.

## 2019-04-13 NOTE — Progress Notes (Signed)
PT Cancellation Note  Patient Details Name: Jesse Murphy MRN: [id number] DOB: [birth year]   Cancelled Treatment:    Reason Eval/Treat Not Completed: Patient at procedure or test/unavailable. Pt undergoing dialysis in room. PT will attempt to follow up as time allows.   Zenaida Niece 04/13/2019, 4:08 PM

## 2019-04-13 NOTE — Progress Notes (Signed)
EVALUATION COMPLETED BY Herbie [place], MA,CCC-SLP  Objective Swallowing Evaluation: Type of Study: FEES-Fiberoptic Endoscopic Evaluation of Swallow   Patient Details  Name: Jesse Murphy MRN: [id number] Date of Birth: Nov 28, 1928  Today's Date: 04/12/2019 Time: SLP Start Time (ACUTE ONLY): 1150 -SLP Stop Time (ACUTE ONLY): 1220  SLP Time Calculation (min) (ACUTE ONLY): 30 min   Past Medical History:  Past Medical History:  Diagnosis Date  . Atrial fibrillation ([place])   . CAD (coronary artery disease)    a. s/p PCI in 1992 b. Coronary CT in 01/2018 showing extensive coronary calcification; FFR indeterminate --> medical management pursued at that time as patient not interested in repeat cath  . Cancer [facility])    prostate  . Dyspnea   . Hypertension    Past Surgical History:  Past Surgical History:  Procedure Laterality Date  . CLIPPING OF ATRIAL APPENDAGE N/A 03/16/2019   Procedure: CLIPPING OF LEFT  ATRIAL APPENDAGE - USING ATRICLIP SIZE 40;  Surgeon: Ivin Poot, MD;  Location: [place];  Service: Open Heart Surgery;  Laterality: N/A;  . COLONOSCOPY N/A 08/25/2017   Procedure: COLONOSCOPY;  Surgeon: Rogene Houston, MD;  [facility];  Service: Endoscopy;  Laterality: N/A;  . CORONARY ARTERY BYPASS GRAFT N/A 03/16/2019   Procedure: CORONARY ARTERY BYPASS GRAFTING (CABG), ON PUMP, TIMES THREE, USING LEFT INTERNAL MAMMARY ARTERY, RIGHT GREATER SAPHENOUS VEIN HARVESTED ENDOSCOPICALLY;  Surgeon: Ivin Poot, MD;  Location: [place];  Service: Open Heart Surgery;  Laterality: N/A;  swan only  . HERNIA REPAIR    . MAZE N/A 03/16/2019   Procedure: MAZE;  Surgeon: Ivin Poot, MD;  Location: [place];  Service: Open Heart Surgery;  Laterality: N/A;  . PLACEMENT OF IMPELLA LEFT VENTRICULAR ASSIST DEVICE Right 03/27/2019   Procedure: Placement Of Impella Left Ventricular Assist Device using ABIOMED Impella 5.5 with SmartAssist Device.;  Surgeon: Wonda Olds, MD;  [facility];  Service: Thoracic;  Laterality: Right;  . PROSTATE SURGERY    . REMOVAL OF IMPELLA LEFT VENTRICULAR ASSIST DEVICE N/A 04/10/2019   Procedure: REMOVAL OF IMPELLA LEFT VENTRICULAR ASSIST DEVICE WITH INSERTION OF RIGHT FEMORAL ARTERIAL LINE;  Surgeon: Ivin Poot, MD;  Location: [place];  Service: Open Heart Surgery;  Laterality: N/A;  . RIGHT/LEFT HEART CATH AND CORONARY ANGIOGRAPHY N/A 03/15/2019   Procedure: RIGHT/LEFT HEART CATH AND CORONARY ANGIOGRAPHY;  Surgeon: Burnell Blanks, MD;  Location: [facility];  Service: Cardiovascular;  Laterality: N/A;  . TEE WITHOUT CARDIOVERSION N/A 03/16/2019   Procedure: TRANSESOPHAGEAL ECHOCARDIOGRAM (TEE);  Surgeon: Prescott Gum, Collier Salina, MD;  Location: [place];  Service: Open Heart Surgery;  Laterality: N/A;  . TRACHEOSTOMY TUBE PLACEMENT N/A 03/27/2019   Procedure: TRACHEOSTOMY placed using Shiley 8DCT Cuffed.;  Surgeon: Wonda Olds, MD;  Location[facility];  Service: Thoracic;  Laterality: N/A;   HPI: 86 y.o. male with PMH: h/o CAD s/p CABG and clipping of left atrial appendage, who presented with SOB on 2/1. He became hypoxic on 2/2 with pulmonary edema and needed to be intubated on 2/2 and extubated 2/5. Reintubated 2/8.  Pt underwent impella placement and tracheostomy on 2/9. Pt started on CRRT 2/11. ATC trials on 2/17. Impella removal 2/23.   Subjective: pleasant and comfortable    Assessment / Plan / Recommendation  CHL IP CLINICAL IMPRESSIONS 04/12/2019  Clinical Impression Pt demonstrates generalized weakness impacting bolus propulsion and laryngeal closure with instances of sensed aspiration during the swallow and  diffuse residue throughout the pharynx. Pt specifically demosntrated decreased base of tongue propulsion, decreased epiglottic deflection and slightly incomplete laryngeal closure. Pt sensed aspiratino of thin liquids during the swallow, but did not fully eject. Even with nectar thick liquids taken consecutive with a  straw, no aspiration occurred, though residue of both nectar and puree were mild to moderate. Pt responsive to cueing for effortful swallows, particulalry with visual feedback from FEES, liquid wash also beneficial. Pt to initiate a dys 1/nectar thick diet with PMSV in place. Pt refused trial of cracker (no dentures, aware of residue). Will f/u for tolerance and pharyngeal strengthening to improve function.   SLP Visit Diagnosis Dysphagia, oropharyngeal phase (R13.12)  Attention and concentration deficit following --  Frontal lobe and executive function deficit following --  Impact on safety and function Mild aspiration risk      CHL IP TREATMENT RECOMMENDATION 04/12/2019  Treatment Recommendations Therapy as outlined in treatment plan below     Prognosis 04/12/2019  Prognosis for Safe Diet Advancement Good  Barriers to Reach Goals --  Barriers/Prognosis Comment --    CHL IP DIET RECOMMENDATION 04/12/2019  SLP Diet Recommendations Dysphagia 1 (Puree) solids;Nectar thick liquid  Liquid Administration via Cup;Straw  Medication Administration Crushed with puree  Compensations Slow rate;Small sips/bites;Follow solids with liquid;Effortful swallow  Postural Changes Remain semi-upright after after feeds/meals (Comment)      CHL IP OTHER RECOMMENDATIONS 04/12/2019  Recommended Consults --  Oral Care Recommendations Oral care BID  Other Recommendations Place PMSV during PO intake;Have oral suction available      CHL IP FOLLOW UP RECOMMENDATIONS 04/12/2019  Follow up Recommendations Inpatient Rehab      CHL IP FREQUENCY AND DURATION 04/12/2019  Speech Therapy Frequency (ACUTE ONLY) min 2x/week  Treatment Duration 2 weeks           CHL IP ORAL PHASE 04/12/2019  Oral Phase WFL  Oral - Pudding Teaspoon --  Oral - Pudding Cup --  Oral - Honey Teaspoon --  Oral - Honey Cup --  Oral - Nectar Teaspoon --  Oral - Nectar Cup --  Oral - Nectar Straw --  Oral - Thin Teaspoon --  Oral - Thin  Cup --  Oral - Thin Straw --  Oral - Puree --  Oral - Mech Soft --  Oral - Regular --  Oral - Multi-Consistency --  Oral - Pill --  Oral Phase - Comment --    CHL IP PHARYNGEAL PHASE 04/12/2019  Pharyngeal Phase Impaired  Pharyngeal- Pudding Teaspoon --  Pharyngeal --  Pharyngeal- Pudding Cup --  Pharyngeal --  Pharyngeal- Honey Teaspoon --  Pharyngeal --  Pharyngeal- Honey Cup --  Pharyngeal --  Pharyngeal- Nectar Teaspoon --  Pharyngeal --  Pharyngeal- Nectar Cup Reduced epiglottic inversion;Reduced tongue base retraction;Pharyngeal residue - valleculae;Lateral channel residue  Pharyngeal --  Pharyngeal- Nectar Straw Reduced epiglottic inversion;Reduced tongue base retraction;Pharyngeal residue - valleculae;Lateral channel residue  Pharyngeal --  Pharyngeal- Thin Teaspoon --  Pharyngeal --  Pharyngeal- Thin Cup Reduced epiglottic inversion;Reduced tongue base retraction;Pharyngeal residue - valleculae;Lateral channel residue;Penetration/Aspiration during swallow;Pharyngeal residue - pyriform;Inter-arytenoid space residue;Trace aspiration  Pharyngeal Material enters airway, passes BELOW cords and not ejected out despite cough attempt by patient  Pharyngeal- Thin Straw --  Pharyngeal --  Pharyngeal- Puree Reduced epiglottic inversion;Reduced tongue base retraction;Pharyngeal residue - valleculae;Lateral channel residue  Pharyngeal --  Pharyngeal- Mechanical Soft --  Pharyngeal --  Pharyngeal- Regular --  Pharyngeal --  Pharyngeal- Multi-consistency --  Pharyngeal --  Pharyngeal- Pill --  Pharyngeal --  Pharyngeal Comment --

## 2019-04-14 ENCOUNTER — Inpatient Hospital Stay (HOSPITAL_COMMUNITY): Payer: Medicare Other

## 2019-04-14 DIAGNOSIS — M7989 Other specified soft tissue disorders: Secondary | ICD-10-CM

## 2019-04-14 LAB — URINE CULTURE: Culture: NO GROWTH

## 2019-04-14 LAB — RENAL FUNCTION PANEL
Albumin: 2.2 g/dL — ABNORMAL LOW (ref 3.5–5.0)
Anion gap: 11 (ref 5–15)
BUN: 59 mg/dL — ABNORMAL HIGH (ref 8–23)
CO2: 25 mmol/L (ref 22–32)
Calcium: 7.7 mg/dL — ABNORMAL LOW (ref 8.9–10.3)
Chloride: 102 mmol/L (ref 98–111)
Creatinine, Ser: 3.95 mg/dL — ABNORMAL HIGH (ref 0.61–1.24)
GFR calc Af Amer: 15 mL/min — ABNORMAL LOW (ref >60)
GFR calc non Af Amer: 13 mL/min — ABNORMAL LOW (ref >60)
Glucose, Bld: 84 mg/dL (ref 70–99)
Phosphorus: 5.9 mg/dL — ABNORMAL HIGH (ref 2.5–4.6)
Potassium: 4 mmol/L (ref 3.5–5.1)
Sodium: 138 mmol/L (ref 135–145)

## 2019-04-14 LAB — COOXEMETRY PANEL
Carboxyhemoglobin: 1.6 % — ABNORMAL HIGH (ref 0.5–1.5)
Methemoglobin: 0.7 % (ref 0.0–1.5)
O2 Saturation: 67.8 %
Total hemoglobin: 7.8 g/dL — ABNORMAL LOW (ref 12.0–16.0)

## 2019-04-14 LAB — CBC
HCT: 26.3 % — ABNORMAL LOW (ref 39.0–52.0)
Hemoglobin: 8.4 g/dL — ABNORMAL LOW (ref 13.0–17.0)
MCH: 29.8 pg (ref 26.0–34.0)
MCHC: 31.9 g/dL (ref 30.0–36.0)
MCV: 93.3 fL (ref 80.0–100.0)
Platelets: 163 10*3/uL (ref 150–400)
RBC: 2.82 MIL/uL — ABNORMAL LOW (ref 4.22–5.81)
RDW: 16.6 % — ABNORMAL HIGH (ref 11.5–15.5)
WBC: 8.1 10*3/uL (ref 4.0–10.5)
nRBC: 0 % (ref 0.0–0.2)

## 2019-04-14 LAB — GLUCOSE, CAPILLARY
Glucose-Capillary: 105 mg/dL — ABNORMAL HIGH (ref 70–99)
Glucose-Capillary: 117 mg/dL — ABNORMAL HIGH (ref 70–99)
Glucose-Capillary: 155 mg/dL — ABNORMAL HIGH (ref 70–99)
Glucose-Capillary: 69 mg/dL — ABNORMAL LOW (ref 70–99)
Glucose-Capillary: 88 mg/dL (ref 70–99)
Glucose-Capillary: 92 mg/dL (ref 70–99)
Glucose-Capillary: 92 mg/dL (ref 70–99)

## 2019-04-14 LAB — MAGNESIUM: Magnesium: 2.1 mg/dL (ref 1.7–2.4)

## 2019-04-14 MED ORDER — DARBEPOETIN ALFA 60 MCG/0.3ML IJ SOSY
60.0000 ug | PREFILLED_SYRINGE | INTRAMUSCULAR | Status: DC
  Administered 2019-04-17: 60 ug via SUBCUTANEOUS
  Filled 2019-04-14: qty 0.3

## 2019-04-14 MED ORDER — ATORVASTATIN CALCIUM 80 MG PO TABS
80.0000 mg | ORAL_TABLET | Freq: Every day | ORAL | Status: DC
  Administered 2019-04-14 – 2019-05-02 (×19): 80 mg via ORAL
  Filled 2019-04-14 (×19): qty 1

## 2019-04-14 MED ORDER — MIDODRINE HCL 5 MG PO TABS
15.0000 mg | ORAL_TABLET | Freq: Three times a day (TID) | ORAL | Status: DC
  Administered 2019-04-14 – 2019-05-03 (×57): 15 mg via ORAL
  Filled 2019-04-14 (×58): qty 3

## 2019-04-14 MED ORDER — FUROSEMIDE 10 MG/ML IJ SOLN
160.0000 mg | Freq: Once | INTRAVENOUS | Status: AC
  Administered 2019-04-14: 160 mg via INTRAVENOUS
  Filled 2019-04-14: qty 16

## 2019-04-14 MED ORDER — ASPIRIN 81 MG PO CHEW
81.0000 mg | CHEWABLE_TABLET | Freq: Every day | ORAL | Status: DC
  Administered 2019-04-14 – 2019-05-03 (×19): 81 mg via ORAL
  Filled 2019-04-14 (×18): qty 1

## 2019-04-14 NOTE — Plan of Care (Signed)
  Problem: Clinical Measurements: Goal: Ability to maintain clinical measurements within normal limits will improve Outcome: Progressing   Problem: Nutrition: Goal: Adequate nutrition will be maintained Outcome: Progressing   Problem: Health Behavior/Discharge Planning: Goal: Ability to manage tracheostomy will improve Outcome: Progressing

## 2019-04-14 NOTE — Progress Notes (Signed)
Nephrology Progress Note:  Patient ID: Jesse Murphy, male   DOB: 05-25-1928, 86 y.o.   MRN: [id number]   S:  Reviewed charting.  Had HD for clearance on 2/26 with 235 mL UF; pt had to increase norepi from 2 to 12 mcg and went back into rapid afib and received amio bolus per CTS.  On levo at 7 mcg/min this AM. Ins/outs not accurate due to bladder irrigation - urology evaluated for recurrent gross hematuria and requirement for extensive irrigation.  Last CVP 8 per nursing.   Review of systems:    Denies air hunger  Denies n/v Still getting irrigation - see above.  O:BP 130/61   Pulse 75   Temp 98.5 F (36.9 C) (Oral)   Resp 17   Ht '5\' 8"'  (1.727 m)   Wt 89.8 kg   SpO2 93%   BMI 30.10 kg/m   Intake/Output Summary (Last 24 hours) at 04/14/2019 0534 Last data filed at 04/14/2019 0200 Gross per 24 hour  Intake 18436.81 ml  Output 17100 ml  Net 1336.81 ml   Intake/Output: I/O last 3 completed shifts: In: 25342.5 [I.V.:766.5; Blood:356; WUJWJ:19147; NG/GT:80; IV Piggyback:100] Out: 82956 [Urine:21150; Drains:30; Other:235]  Intake/Output this shift:  Total I/O In: 15190.1 [I.V.:50.1; OZHYQ:65784; IV Piggyback:100] Out: 15050 [Urine:15050] Weight change: 0 kg   Physical exam:  Gen: elderly male in bed chronically ill-appearing HEENT trach in place; on vent  CVS: S1 S2 no rub Resp: clear; unlabored at rest; trach collar Abd: soft/ NT/ND  Ext: 1+ pedal edema and 2+ upper extremity edema Neuro - awake and interactive and follows commands, nods Psych - no anxiety or agitation  GU foley in place with hematuria/irrigation Access: left chest nontunneled dialysis catheter   Recent Labs  Lab 04/10/19 0345 04/10/19 0345 04/10/19 1512 04/11/19 0415 04/11/19 1546 04/12/19 0508 04/12/19 1351 04/13/19 0427 04/14/19 0243  NA 138   < > 134* 135 137 137 135 134* 138  K 4.8   < > 5.0 5.7* 5.0 5.1 4.7 4.4 4.0  CL 104   < > 102 104 103 101 99 99 102  CO2 27   < > 22 20* 23 22 20* 20*  25  GLUCOSE 108*   < > 167* 95 168* 151* 201* 145* 84  BUN 31*   < > 40* 54* 38* 60* 73* 95* 59*  CREATININE 1.66*   < > 2.25* 3.25* 2.43* 3.49* 3.99* 4.90* 3.95*  ALBUMIN 2.2*  --  2.2* 2.0* 2.0* 1.9*  --  2.0* 2.2*  CALCIUM 7.8*   < > 7.3* 7.7* 7.5* 7.6* 7.5* 7.6* 7.7*  PHOS 2.6  --  3.1 5.2* 3.9 6.0*  --  8.0* 5.9*   < > = values in this interval not displayed.   Liver Function Tests: Recent Labs  Lab 04/12/19 0508 04/13/19 0427 04/14/19 0243  ALBUMIN 1.9* 2.0* 2.2*    CBC: Recent Labs  Lab 04/11/19 0415 04/11/19 0415 04/12/19 0508 04/12/19 0508 04/12/19 1351 04/13/19 0427 04/14/19 0242  WBC 10.7*   < > 8.8   < > 11.9* 11.0* 8.1  HGB 8.5*   < > 7.5*   < > 9.7* 9.4* 8.4*  HCT 26.7*   < > 24.1*   < > 29.2* 28.5* 26.3*  MCV 92.4  --  94.5  --  91.0 91.1 93.3  PLT 143*   < > 179   < > 217 215 163   < > = values in this interval not  displayed.   Cardiac Enzymes: No results for input(s): CKTOTAL, CKMB, CKMBINDEX, TROPONINI in the last 168 hours. CBG: Recent Labs  Lab 04/13/19 1558 04/13/19 2110 04/14/19 0014 04/14/19 0332 04/14/19 0409  GLUCAP 139* 114* 92 69* 117*   IStudies/Results: CT ABDOMEN PELVIS WO CONTRAST  Result Date: 04/13/2019 CLINICAL DATA:  Hematuria EXAM: CT ABDOMEN AND PELVIS WITHOUT CONTRAST TECHNIQUE: Multidetector CT imaging of the abdomen and pelvis was performed following the standard protocol without IV contrast. COMPARISON:  None. FINDINGS: Motion and streak artifact present. Lower chest: Bilateral pleural effusions, larger on the left with bibasilar atelectasis. Hepatobiliary: No focal liver abnormality. The gallbladder is contracted. There is no biliary dilatation. Pancreas: Unremarkable Spleen: Unremarkable Adrenals/Urinary Tract: Adrenals are unremarkable. 2 mm nonobstructing calculus of the upper pole of the left kidney. Bladder is partially collapsed with Foley catheter present. There is debris within the bladder dependently. Stomach/Bowel:  Stomach is within normal limits. Bowel is normal in caliber. There is distal colonic diverticulosis. Normal appendix. Vascular/Lymphatic: Aortic atherosclerosis. No enlarged abdominal or pelvic lymph nodes. Reproductive: Foci of metallic density at the level of prostate likely secondary to prior cancer treatment. Other: Left inguinal hernia containing fat and sigmoid colon. No ascites or free air. Musculoskeletal: Multilevel degenerative changes of the included spine. IMPRESSION: Dependent debris within the partially decompressed bladder likely reflecting blood products. 2 mm nonobstructing left renal calculus. Colonic diverticulosis. Left inguinal hernia containing sigmoid colon. Bilateral pleural effusions. Electronically Signed   By: Macy Mis M.D.   On: 04/13/2019 11:42   DG Chest Port 1 View  Result Date: 04/13/2019 CLINICAL DATA:  Status post coronary artery bypass graft. EXAM: PORTABLE CHEST 1 VIEW COMPARISON:  April 12, 2019. FINDINGS: Stable cardiomediastinal silhouette. Tracheostomy tube is unchanged in position. Left subclavian catheter is unchanged. Right-sided PICC line is unchanged. No pneumothorax is noted. Right lung is clear. Mild left pleural effusion is noted with associated atelectasis. Bony thorax is unremarkable. IMPRESSION: Stable support apparatus. Mild left pleural effusion is noted with associated atelectasis. Electronically Signed   By: Marijo Conception M.D.   On: 04/13/2019 08:57   DG Chest Port 1 View  Result Date: 04/12/2019 CLINICAL DATA:  Tracheostomy, post CABG, hypertension, prostate cancer EXAM: PORTABLE CHEST 1 VIEW COMPARISON:  Portable exam 0549 hours compared to 04/11/2019 FINDINGS: Tracheostomy tube, nasogastric tube, RIGHT arm PICC line, and LEFT jugular dual-lumen central venous catheter unchanged. Upper normal size of cardiac silhouette post CABG and LEFT atrial appendage clipping. Scattered interstitial infiltrates in the perihilar regions slightly greater  on LEFT favor mild pulmonary edema. Atelectasis versus consolidation LEFT lower lobe with small associated LEFT pleural effusion. No pneumothorax. IMPRESSION: Mild pulmonary edema with atelectasis versus consolidation in LEFT lower lobe and small LEFT pleural effusion. Electronically Signed   By: Lavonia Dana M.D.   On: 04/12/2019 09:19   . sodium chloride   Intravenous Once  . amiodarone  200 mg Oral Daily  . aspirin  81 mg Per Tube Daily  . atorvastatin  80 mg Per Tube q1800  . chlorhexidine gluconate (MEDLINE KIT)  15 mL Mouth Rinse BID  . Chlorhexidine Gluconate Cloth  6 each Topical Daily  . Chlorhexidine Gluconate Cloth  6 each Topical Q0600  . darbepoetin (ARANESP) injection - NON-DIALYSIS  40 mcg Subcutaneous Q Tue-1800  . feeding supplement (NEPRO CARB STEADY)  237 mL Oral TID BM  . feeding supplement (PRO-STAT SUGAR FREE 64)  30 mL Oral BID  . Gerhardt's butt cream   Topical  BID  . hydrocortisone   Rectal BID  . insulin aspart  0-24 Units Subcutaneous Q4H  . insulin glargine  26 Units Subcutaneous Daily  . mouth rinse  15 mL Mouth Rinse 10 times per day  . midodrine  10 mg Oral TID WC  . multivitamin  1 tablet Oral QHS  . pantoprazole (PROTONIX) IV  40 mg Intravenous Q24H  . sevelamer carbonate  800 mg Oral TID WC  . sodium chloride flush  10-40 mL Intracatheter Q12H  . tamsulosin  0.4 mg Oral Daily    BMET    Component Value Date/Time   NA 138 04/14/2019 0243   K 4.0 04/14/2019 0243   CL 102 04/14/2019 0243   CO2 25 04/14/2019 0243   GLUCOSE 84 04/14/2019 0243   BUN 59 (H) 04/14/2019 0243   CREATININE 3.95 (H) 04/14/2019 0243   CALCIUM 7.7 (L) 04/14/2019 0243   GFRNONAA 13 (L) 04/14/2019 0243   GFRAA 15 (L) 04/14/2019 0243   CBC    Component Value Date/Time   WBC 8.1 04/14/2019 0242   RBC 2.82 (L) 04/14/2019 0242   HGB 8.4 (L) 04/14/2019 0242   HCT 26.3 (L) 04/14/2019 0242   PLT 163 04/14/2019 0242   MCV 93.3 04/14/2019 0242   MCH 29.8 04/14/2019 0242   MCHC  31.9 04/14/2019 0242   RDW 16.6 (H) 04/14/2019 0242   LYMPHSABS 0.5 (L) 04/06/2019 1233   MONOABS 1.9 (H) 04/06/2019 1233   EOSABS 0.2 04/06/2019 1233   BASOSABS 0.0 04/06/2019 1233    Assessment/Plan:  1. AKI(Cr was 0.7 prior to surgery)- due to postoperative CHF/cardiogenic shock that was refractory to diuretics and started on CRRT on 03/29/19 for volume removal.  CRRT off on 2/23 - discussion with advanced CHF.  CRRT restarted 2/24 then off after clotting.  First HD on 2/26 with minimal UF and note increased pressors and rapid afib 1. No acute indication for HD today.  Follow needs daily and anticipate next HD on 3/1.  No heparin  2. Per advanced CHF goal CVP of 5-8 while not having to increase pressors 2. Acute systolic CHF/cardiogenic shock- EF 25-30%; s/p Impella. optimize volume as above  3. CAD s/p CABG on 0/35/24 complicated by cardiogenic shock and ARF. 4. Acute hypoxic respiratory failure with pseudomonas pneumonia- s/p trach on 03/27/19. abx per PCCM 5. PAF- s/p MAZE and LAA occlusion- therapy per CHF team 6. Hematuria- bladder irrigation 7. ABLA- Hx Melena. Transfuse as needed. Have given RRBC's on 2/22 and 2/23. And 2/25.  Gave aranesp 2/23 40 mcg and inc next dose to 60 mcg for 3/2 for now.  8. Protein malnutrition- moderate to severe 9. Secondary hyperparathyroidism/bone mineral -  On renvela.  Check intact PTH. 10. Disposition- Am concerned that he is a poor longterm dialysis candidate given his cardiogenic shock and trach.  Claudia Desanctis, MD 04/14/2019 5:50 AM

## 2019-04-14 NOTE — Progress Notes (Signed)
4 Days Post-Op Procedure(s) (LRB): REMOVAL OF IMPELLA LEFT VENTRICULAR ASSIST DEVICE WITH INSERTION OF RIGHT FEMORAL ARTERIAL LINE (N/A) Subjective: No specific complaints.  Continues to have a few clots from foley intermittently. On continuous bladder irrigation  Ate half of breakfast per nurse.  Objective: Vital signs in last 24 hours: Temp:  [97.6 F (36.4 C)-98.5 F (36.9 C)] 98 F (36.7 C) (02/27 0828) Pulse Rate:  [72-130] 79 (02/27 0635) Cardiac Rhythm: Normal sinus rhythm (02/27 0400) Resp:  [12-31] 14 (02/27 0635) BP: (63-130)/(34-72) 93/72 (02/27 0630) SpO2:  [92 %-99 %] 93 % (02/27 0635) FiO2 (%):  [28 %] 28 % (02/27 0755) Weight:  [88.4 kg-89.8 kg] 88.4 kg (02/27 0500)  Hemodynamic parameters for last 24 hours: CVP:  [7 mmHg-8 mmHg] 8 mmHg  Intake/Output from previous day: 02/26 0701 - 02/27 0700 In: 18438.8 [I.V.:298.8; IV Piggyback:100] Out: 56812 [Urine:16100; Drains:10] Intake/Output this shift: No intake/output data recorded.  General appearance: alert and cooperative Neurologic: intact Heart: regular rate and rhythm, S1, S2 normal, no murmur Lungs: diminished breath sounds bibasilar Abdomen: soft, non-tender; bowel sounds normal Extremities: anasarca Wound: chest incision ok, Dressing over impella site ok. Minimal drainage from JP.  Lab Results: Recent Labs    04/13/19 0427 04/14/19 0242  WBC 11.0* 8.1  HGB 9.4* 8.4*  HCT 28.5* 26.3*  PLT 215 163   BMET:  Recent Labs    04/13/19 0427 04/14/19 0243  NA 134* 138  K 4.4 4.0  CL 99 102  CO2 20* 25  GLUCOSE 145* 84  BUN 95* 59*  CREATININE 4.90* 3.95*  CALCIUM 7.6* 7.7*    PT/INR: No results for input(s): LABPROT, INR in the last 72 hours. ABG    Component Value Date/Time   PHART 7.488 (H) 04/07/2019 0405   HCO3 25.9 04/07/2019 0405   TCO2 27 04/07/2019 0405   ACIDBASEDEF 1.0 04/05/2019 0540   O2SAT 79.0 04/13/2019 0427   CBG (last 3)  Recent Labs    04/14/19 0332  04/14/19 0409 04/14/19 0840  GLUCAP 69* 117* 92   CXR: small bilateral effusions L>R with bibasilar atelectasis.  Assessment/Plan: S/P Procedure(s) (LRB): REMOVAL OF IMPELLA LEFT VENTRICULAR ASSIST DEVICE WITH INSERTION OF RIGHT FEMORAL ARTERIAL LINE (N/A)  Hemodynamically stable in sinus rhythm but still on levophed 7 mcg to support BP. Was up to 12 mcg after HD yesterday.  Midodrine started yesterday but may take a few days to become effective. He has low diastolic BP but I don't hear an AI murmur on exam.  Remains stable on trach collar with PM Valve for talking and eating.   Eating fairly well considering. Continue to encourage nutrition and supplements. Albumin 2.2 which will make anasarca worse and make it difficult to resolve.  HD per nephrology.  Continue bladder irrigation per urology.  OOB to chair, PT.     LOS: 31 days    Jesse Murphy 04/14/2019

## 2019-04-14 NOTE — Progress Notes (Addendum)
Patient ID: Jesse Murphy, male   DOB: 1928/04/14, 86 y.o.   MRN: [id number] f    Advanced Heart Failure Rounding Note  PCP-Cardiologist: Kate Sable, MD   Subjective:    Impella removed 2/23. Intraoperative TEE showed EF ~40%  Trach capped this am. Talking with PMV. Says he feels ok. Denies CP or SOB. Hands swollen.   Remains on CBI. Hematuria seems to have slowed.    On NE 7 mcg + midodrine 10 tid. MAPs 70-80s. CVP 8-10. Checked personally. No co-ox drawn   Had HD for clearance on 2/26 with 235 mL UF; pt had to increase norepi from 2 to 12 mcg and went back into rapid afib. Now back in NSR on IV amio.   K 4.0 Hgb 8.4  Objective:   Weight Range: 88.4 kg Body mass index is 29.63 kg/m.   Vital Signs:   Temp:  [97.6 F (36.4 C)-98.5 F (36.9 C)] 98 F (36.7 C) (02/27 0828) Pulse Rate:  [71-130] 80 (02/27 1045) Resp:  [12-31] 21 (02/27 1045) BP: (63-130)/(33-96) 105/37 (02/27 1030) SpO2:  [90 %-99 %] 96 % (02/27 1045) FiO2 (%):  [28 %] 28 % (02/27 0800) Weight:  [88.4 kg-89.8 kg] 88.4 kg (02/27 0500) Last BM Date: 04/12/19  Weight change: Filed Weights   04/13/19 0600 04/13/19 1430 04/14/19 0500  Weight: 89.8 kg 89.8 kg 88.4 kg    Intake/Output:   Intake/Output Summary (Last 24 hours) at 04/14/2019 1113 Last data filed at 04/14/2019 1000 Gross per 24 hour  Intake 21618.11 ml  Output 22195 ml  Net -576.89 ml      Physical Exam   General:  Sitting up in bed. No resp difficulty HEENT: normal Neck: supple. +tach capped  JVP 8-10. Carotids 2+ bilat; no bruits. No lymphadenopathy or thryomegaly appreciated. Cor: PMI nondisplaced. Regular rate & rhythm. No rubs, gallops or murmurs. Impella site ok with JP drain in place. Bulb empty. [facility] trialysis cath Lungs: clear Abdomen: soft, nontender, nondistended. No hepatosplenomegaly. No bruits or masses. Good bowel sounds. Extremities: no cyanosis, clubbing, rash, 1+ LE edema. 3+ UE edema L> R Neuro: alert &  orientedx3, cranial nerves grossly intact. moves all 4 extremities w/o difficulty. Affect pleasant   Telemetry   NSR 80s Personally reviewed  Labs    CBC Recent Labs    04/13/19 0427 04/14/19 0242  WBC 11.0* 8.1  HGB 9.4* 8.4*  HCT 28.5* 26.3*  MCV 91.1 93.3  PLT 215 858   Basic Metabolic Panel Recent Labs    04/13/19 0427 04/14/19 0242 04/14/19 0243  NA 134*  --  138  K 4.4  --  4.0  CL 99  --  102  CO2 20*  --  25  GLUCOSE 145*  --  84  BUN 95*  --  59*  CREATININE 4.90*  --  3.95*  CALCIUM 7.6*  --  7.7*  MG 2.6* 2.1  --   PHOS 8.0*  --  5.9*   Liver Function Tests Recent Labs    04/13/19 0427 04/14/19 0243  ALBUMIN 2.0* 2.2*   No results for input(s): LIPASE, AMYLASE in the last 72 hours. Cardiac Enzymes No results for input(s): CKTOTAL, CKMB, CKMBINDEX, TROPONINI in the last 72 hours.  BNP: BNP (last 3 results) Recent Labs    03/22/19 0401 03/26/19 0626 03/27/19 1255  BNP 340.4* 687.5* 800.4*    ProBNP (last 3 results) No results for input(s): PROBNP in the last 8760 hours.   D-Dimer No  results for input(s): DDIMER in the last 72 hours. Hemoglobin A1C No results for input(s): HGBA1C in the last 72 hours. Fasting Lipid Panel No results for input(s): CHOL, HDL, LDLCALC, TRIG, CHOLHDL, LDLDIRECT in the last 72 hours. Thyroid Function Tests No results for input(s): TSH, T4TOTAL, T3FREE, THYROIDAB in the last 72 hours.  Invalid input(s): FREET3  Other results:   Imaging    CT ABDOMEN PELVIS WO CONTRAST  Result Date: 04/13/2019 CLINICAL DATA:  Hematuria EXAM: CT ABDOMEN AND PELVIS WITHOUT CONTRAST TECHNIQUE: Multidetector CT imaging of the abdomen and pelvis was performed following the standard protocol without IV contrast. COMPARISON:  None. FINDINGS: Motion and streak artifact present. Lower chest: Bilateral pleural effusions, larger on the left with bibasilar atelectasis. Hepatobiliary: No focal liver abnormality. The gallbladder is  contracted. There is no biliary dilatation. Pancreas: Unremarkable Spleen: Unremarkable Adrenals/Urinary Tract: Adrenals are unremarkable. 2 mm nonobstructing calculus of the upper pole of the left kidney. Bladder is partially collapsed with Foley catheter present. There is debris within the bladder dependently. Stomach/Bowel: Stomach is within normal limits. Bowel is normal in caliber. There is distal colonic diverticulosis. Normal appendix. Vascular/Lymphatic: Aortic atherosclerosis. No enlarged abdominal or pelvic lymph nodes. Reproductive: Foci of metallic density at the level of prostate likely secondary to prior cancer treatment. Other: Left inguinal hernia containing fat and sigmoid colon. No ascites or free air. Musculoskeletal: Multilevel degenerative changes of the included spine. IMPRESSION: Dependent debris within the partially decompressed bladder likely reflecting blood products. 2 mm nonobstructing left renal calculus. Colonic diverticulosis. Left inguinal hernia containing sigmoid colon. Bilateral pleural effusions. Electronically Signed   By: Macy Mis M.D.   On: 04/13/2019 11:42   DG Chest Port 1 View  Result Date: 04/14/2019 CLINICAL DATA:  History of CABG. EXAM: PORTABLE CHEST 1 VIEW COMPARISON:  04/13/2019 FINDINGS: Cardiopericardial silhouette is at upper limits of normal for size. No pulmonary edema. Bibasilar atelectasis noted with probable small effusions, left greater than right. Right PICC line tip overlies the distal SVC. Left subclavian central line tip overlies the mid SVC. Tracheostomy tube remains in place. Telemetry leads overlie the chest. IMPRESSION: Stable exam. Bibasilar atelectasis with small effusions, left greater than right. Electronically Signed   By: Misty Stanley M.D.   On: 04/14/2019 08:30     Medications:     Scheduled Medications:  sodium chloride   Intravenous Once   amiodarone  200 mg Oral Daily   aspirin  81 mg Oral Daily   atorvastatin  80 mg  Oral q1800   chlorhexidine gluconate (MEDLINE KIT)  15 mL Mouth Rinse BID   Chlorhexidine Gluconate Cloth  6 each Topical Daily   Chlorhexidine Gluconate Cloth  6 each Topical Q0600   [START ON 04/17/2019] darbepoetin (ARANESP) injection - NON-DIALYSIS  60 mcg Subcutaneous Q Tue-1800   feeding supplement (NEPRO CARB STEADY)  237 mL Oral TID BM   feeding supplement (PRO-STAT SUGAR FREE 64)  30 mL Oral BID   Gerhardt's butt cream   Topical BID   hydrocortisone   Rectal BID   insulin aspart  0-24 Units Subcutaneous Q4H   insulin glargine  26 Units Subcutaneous Daily   mouth rinse  15 mL Mouth Rinse 10 times per day   midodrine  10 mg Oral TID WC   multivitamin  1 tablet Oral QHS   pantoprazole (PROTONIX) IV  40 mg Intravenous Q24H   sevelamer carbonate  800 mg Oral TID WC   sodium chloride flush  10-40 mL Intracatheter  Q12H   tamsulosin  0.4 mg Oral Daily    Infusions:  sodium chloride     sodium chloride Stopped (04/04/19 1736)   sodium chloride Stopped (04/13/19 1216)   sodium chloride     sodium chloride     norepinephrine (LEVOPHED) Adult infusion 7 mcg/min (04/14/19 1000)    PRN Medications: sodium chloride, sodium chloride, sodium chloride, oxyCODONE **AND** acetaminophen, alteplase, dextrose, fentaNYL (SUBLIMAZE) injection, heparin, hyoscyamine, lidocaine (PF), lidocaine-prilocaine, ondansetron (ZOFRAN) IV, pentafluoroprop-tetrafluoroeth, Resource ThickenUp Clear, sodium chloride flush, sodium chloride flush    Assessment/Plan   1. Acute systolic HF -> Cardiogenic shock - post-op echo on 03/20/19 EF 25-30% (pre-op 30-35%. In 12/2017 EF normal) -Required Impella support post CABG. Impella removed 2/23 - Off VP. Norepi 7 mcg +midodrine 10 tid. CO-OX not drawn.  - CVP 8-10 checked personally - NE dose increase to support HD yesterday. Will try to wean again. Increase midodrine to 15 tid - No HD planned today. Will give test dose lasix today.    2. CAD  s/p CABG this admit (LIMA->LAD, SVG->OM, SVG->RCA on 03/15/19) - No angina - Continue ASA +statin  3. Acute hypoxic respiratory failure with Pseudomonas PNA - s/p trach on 03/27/19 - on TC with PMV today - completed meropenem for pseudomonas PNA  4. PAF - s/p MAZE and LAA occlusion - continues to go in/out of AF - In NSR today On amio 200 mg daily. Will increase to 200 bid - Off systemic heparin with hematuria and melena/ anemia.   5. AKI  - due to shock/ATN - Started [facility] 03/29/19 per Nephrology.  - Had HD on 2/26. None planned today - Will give test dose lasix - Remain concerned about ability to tolerate HD with low BP. However we are making progress   6. Hematuria  -Off heparin drip.  - Continue CBI     - Urology following.  - Seems improved today  7. Melena - No melena overnight.   8. Anemia -Hematuria/melena. -Hgb 9.4 -> 8.4  toda  9. Debility, severe - continue PT - get OOB and in chair today  10. Hyperkalemia - K 4.0 today after HD yesterday  11. UE swelling - check u/s. Wrap/elevate  CRITICAL CARE Performed by: Glori Bickers  Total critical care time: 35 minutes  Critical care time was exclusive of separately billable procedures and treating other patients.  Critical care was necessary to treat or prevent imminent or life-threatening deterioration.  Critical care was time spent personally by me (independent of midlevel providers or residents) on the following activities: development of treatment plan with patient and/or surrogate as well as nursing, discussions with consultants, evaluation of patient's response to treatment, examination of patient, obtaining history from patient or surrogate, ordering and performing treatments and interventions, ordering and review of laboratory studies, ordering and review of radiographic studies, pulse oximetry and re-evaluation of patient's condition.    Glori Bickers, MD  11:13 AM

## 2019-04-14 NOTE — Progress Notes (Signed)
Patient ID: Jesse Murphy, male   DOB: 1929-01-29, 86 y.o.   MRN: [id number] T[facility] Evening Rounds:  Hemodynamically stable on levophed 7 mcg in sinus rhythm. Co-ox 68% this afternoon.  Sats 96% on TC.  Received 160 mg lasix IV today by DB but only put out 50 cc urine.

## 2019-04-14 NOTE — Progress Notes (Signed)
  Speech Language Pathology Treatment: Dysphagia;[place] Speaking valve  Patient Details Name: Jesse Murphy MRN: [id number] DOB: [birth year] Today's Date: 04/14/2019 Time: 5456-2563 SLP Time Calculation (min) (ACUTE ONLY): 16 min  Assessment / Plan / Recommendation Clinical Impression  Pt was seen for skilled ST targeting PMV tolerance and dysphagia tx.  Pt was encountered asleep in bed and he roused to minimal verbal stimulation.  RN reported that pt was tolerating his current diet well and that he consumed approximately 1/2 of his breakfast meal tray with PMV in place.  Pt's cuff was deflated upon arrival and SLP donned the pt's PMV.  He was able to achieve phonation without difficulty and minimal hoarse vocal quality was observed.  Pt's O2 saturation decreased mildly from 98% to 94%, but it remained around 96% for the majority of this tx session.  No other significant changes in vitals were observed.  Pt tolerated PMV placement for 15 minutes until he requested that it be removed secondary to him wishing to take a nap.  While PMV was donned, pt was able to converse with MD regarding his POC and participate in dysphagia tx.  He consumed trials of Dysphagia 1 (puree) solids and nectar-thick liquids.  Pt followed cues to take small, single sips of his drink via straw sip, and AP transport and swallow initiation appeared timely.  He exhibited an immediate cough following 1/10 sips of NTL; otherwise, no overt s/sx of aspiration were observed with any trials.  Recommend continuation of Dysphagia 1 (puree) solids and nectar-thick liquids with medications crushed in puree and full supervision to assist with feeding and to cue for compensatory strategies.  Pt must have PMV donned for all PO intake.  SLP will continue to f/u for treatment per POC.      HPI HPI: 86 y.o. male with PMH: h/o CAD s/p CABG and clipping of left atrial appendage, who presented with SOB on 2/1. He became hypoxic on 2/2 with pulmonary  edema and needed to be intubated on 2/2 and extubated 2/5. Reintubated 2/8.  Pt underwent impella placement and tracheostomy on 2/9. Pt started on CRRT 2/11. ATC trials on 2/17. Impella removal 2/23.      SLP Plan  Continue with current plan of care       Recommendations  Diet recommendations: Dysphagia 1 (puree);Nectar-thick liquid Liquids provided via: Cup;Straw Medication Administration: Crushed with puree Supervision: Full supervision/cueing for compensatory strategies;Staff to assist with self feeding Compensations: Slow rate;Small sips/bites;Follow solids with liquid;Effortful swallow Postural Changes and/or Swallow Maneuvers: Seated upright 90 degrees      Patient may use Passy-Muir Speech Valve: During all waking hours (remove during sleep);During all therapies with supervision;During PO intake/meals PMSV Supervision: Intermittent MD: Please consider changing trach tube to : Cuffless         Oral Care Recommendations: Oral care QID Follow up Recommendations: Inpatient Rehab SLP Visit Diagnosis: Dysphagia, oropharyngeal phase (R13.12) Plan: Continue with current plan of care                     Colin Mulders M.S., [place] Office: [phone]  [place] 04/14/2019, 10:52 AM

## 2019-04-14 NOTE — Progress Notes (Signed)
Hypoglycemic Event  CBG: 69  Treatment: 8oz of Spirite   Symptoms: NA  Follow-up CBG: Time:0400 CBG Result: 117  Possible Reasons for Event: Inadequate intake    Comments/MD notified: NA. Order per Hypoglycemic protocol    Katha Hamming

## 2019-04-14 NOTE — Progress Notes (Addendum)
4 Days Post-Op Subjective: Patient reports that he had prostate cancer. 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 Nurse reports urine has cleared. Occasional red but increase CBI flushes to clear immediately.   I communicated with Dr. Matilde Sprang and reviewed CT scan.   Objective: Vital signs in last 24 hours: Temp:  [97.6 F (36.4 C)-98.5 F (36.9 C)] 98 F (36.7 C) (02/27 0828) Pulse Rate:  [71-130] 80 (02/27 1045) Resp:  [12-31] 21 (02/27 1045) BP: (63-130)/(33-96) 105/37 (02/27 1030) SpO2:  [90 %-99 %] 96 % (02/27 1045) FiO2 (%):  [28 %] 28 % (02/27 1157) Weight:  [88.4 kg-89.8 kg] 88.4 kg (02/27 0500)  Intake/Output from previous day: 02/26 0701 - 02/27 0700 In: 18438.8 [I.V.:298.8; IV Piggyback:100] Out: 13244 [Urine:16100; Drains:10] Intake/Output this shift: Total I/O In: 6266.3 [P.O.:240; I.V.:26.3; Other:6000] Out: 5850 [Urine:5850]  Physical Exam:  NAD 3 way foley in place - on slow CBI and clear drainage. I hand irrigated and clear.    Lab Results: Recent Labs    04/12/19 1351 04/13/19 0427 04/14/19 0242  HGB 9.7* 9.4* 8.4*  HCT 29.2* 28.5* 26.3*   BMET Recent Labs    04/13/19 0427 04/14/19 0243  NA 134* 138  K 4.4 4.0  CL 99 102  CO2 20* 25  GLUCOSE 145* 84  BUN 95* 59*  CREATININE 4.90* 3.95*  CALCIUM 7.6* 7.7*   No results for input(s): LABPT, INR in the last 72 hours. No results for input(s): LABURIN in the last 72 hours. Results for orders placed or performed during the hospital encounter of 03/13/19  Respiratory Panel by RT PCR (Flu A&B, Covid) - Nasopharyngeal Swab     Status: None   Collection Time: 03/13/19  1:49 PM   Specimen: Nasopharyngeal Swab  Result Value Ref Range Status   SARS Coronavirus 2 by RT PCR NEGATIVE NEGATIVE Final    Comment: (NOTE) SARS-CoV-2 target nucleic acids are NOT DETECTED. The SARS-CoV-2 RNA is generally detectable in upper respiratoy specimens during the acute phase of  infection. The lowest concentration of SARS-CoV-2 viral copies this assay can detect is 131 copies/mL. A negative result does not preclude SARS-Cov-2 infection and should not be used as the sole basis for treatment or other patient management decisions. A negative result may occur with  improper specimen collection/handling, submission of specimen other than nasopharyngeal swab, presence of viral mutation(s) within the areas targeted by this assay, and inadequate number of viral copies (<131 copies/mL). A negative result must be combined with clinical observations, patient history, and epidemiological information. The expected result is Negative. Fact Sheet for Patients:  PinkCheek.be Fact Sheet for Healthcare Providers:  GravelBags.it This test is not yet ap proved or cleared by the [place] FDA and  has been authorized for detection and/or diagnosis of SARS-CoV-2 by FDA under an Emergency Use Authorization (EUA). This EUA will remain  in effect (meaning this test can be used) for the duration of the COVID-19 declaration under Section 564(b)(1) of the Act, 21 U.S.C. section 360bbb-3(b)(1), unless the authorization is terminated or revoked sooner.    Influenza A by PCR NEGATIVE NEGATIVE Final   Influenza B by PCR NEGATIVE NEGATIVE Final    Comment: (NOTE) The Xpert Xpress SARS-CoV-2/FLU/RSV assay is intended as an aid in  the diagnosis of influenza from Nasopharyngeal swab specimens and  should not be used as a sole basis for treatment. Nasal washings and  aspirates are unacceptable for Xpert Xpress SARS-CoV-2/FLU/RSV  testing.  Fact Sheet for Patients: PinkCheek.be Fact Sheet for Healthcare Providers: GravelBags.it This test is not yet approved or cleared by the [place] FDA and  has been authorized for detection and/or diagnosis of SARS-CoV-2 by  FDA under  an Emergency Use Authorization (EUA). This EUA will remain  in effect (meaning this test can be used) for the duration of the  Covid-19 declaration under Section 564(b)(1) of the Act, 21  U.S.C. section 360bbb-3(b)(1), unless the authorization is  terminated or revoked. Performed at [facility], [address], [place], [place] [address]   MRSA PCR Screening     Status: None   Collection Time: 03/13/19  5:18 PM   Specimen: Nasal Mucosa; Nasopharyngeal  Result Value Ref Range Status   MRSA by PCR NEGATIVE NEGATIVE Final    Comment:        The GeneXpert MRSA Assay (FDA approved for NASAL specimens only), is one component of a comprehensive MRSA colonization surveillance program. It is not intended to diagnose MRSA infection nor to guide or monitor treatment for MRSA infections. Performed at [facility], [address], [place], [place] [address]   Surgical pcr screen     Status: None   Collection Time: 03/15/19  9:28 PM   Specimen: Nasal Mucosa; Nasal Swab  Result Value Ref Range Status   MRSA, PCR NEGATIVE NEGATIVE Final   Staphylococcus aureus NEGATIVE NEGATIVE Final    Comment: (NOTE) The Xpert SA Assay (FDA approved for NASAL specimens in patients 82 years of age and older), is one component of a comprehensive surveillance program. It is not intended to diagnose infection nor to guide or monitor treatment. Performed at [facility], [place] [address], [place], [place] [address]   Culture, respiratory (non-expectorated)     Status: None   Collection Time: 03/19/19  3:00 PM   Specimen: Tracheal Aspirate; Respiratory  Result Value Ref Range Status   Specimen Description TRACHEAL ASPIRATE  Final   Special Requests Normal  Final   Gram Stain   Final    ABUNDANT WBC PRESENT,BOTH PMN AND MONONUCLEAR FEW GRAM POSITIVE COCCI RARE GRAM NEGATIVE RODS    Culture   Final    FEW Consistent with normal respiratory flora. Performed at [facility], [place] [address],  [place], [place] [address]    Report Status 03/21/2019 FINAL  Final  Culture, respiratory (non-expectorated)     Status: None   Collection Time: 03/26/19  6:29 AM   Specimen: Tracheal Aspirate; Respiratory  Result Value Ref Range Status   Specimen Description TRACHEAL ASPIRATE  Final   Special Requests NONE  Final   Gram Stain   Final    ABUNDANT WBC PRESENT,BOTH PMN AND MONONUCLEAR NO ORGANISMS SEEN Performed at [facility], [address] [place], [place] [address]    Culture FEW PSEUDOMONAS AERUGINOSA  Final   Report Status 03/28/2019 FINAL  Final   Organism ID, Bacteria PSEUDOMONAS AERUGINOSA  Final      Susceptibility   Pseudomonas aeruginosa - MIC*    CEFTAZIDIME 8 SENSITIVE Sensitive     CIPROFLOXACIN <=0.25 SENSITIVE Sensitive     GENTAMICIN 4 SENSITIVE Sensitive     IMIPENEM 2 SENSITIVE Sensitive     * FEW PSEUDOMONAS AERUGINOSA  Culture, respiratory (non-expectorated)     Status: None   Collection Time: 04/03/19 11:08 AM   Specimen: Tracheal Aspirate; Respiratory  Result Value Ref Range Status   Specimen Description TRACHEAL ASPIRATE  Final   Special Requests  NONE  Final   Gram Stain   Final    FEW WBC PRESENT, PREDOMINANTLY PMN FEW GRAM NEGATIVE RODS RARE YEAST    Culture   Final    FEW PSEUDOMONAS AERUGINOSA FEW CANDIDA TROPICALIS    Report Status 04/06/2019 FINAL  Final   Organism ID, Bacteria PSEUDOMONAS AERUGINOSA  Final      Susceptibility   Pseudomonas aeruginosa - MIC*    CEFTAZIDIME 32 RESISTANT Resistant     CIPROFLOXACIN <=0.25 SENSITIVE Sensitive     GENTAMICIN 4 SENSITIVE Sensitive     IMIPENEM Value in next row Sensitive      2 SENSITIVEPerformed at [facility], [address], [place], [place] [address]    * FEW PSEUDOMONAS AERUGINOSA  Culture, blood (Routine X 2) w Reflex to ID Panel     Status: None   Collection Time: 04/03/19 12:30 PM   Specimen: BLOOD LEFT HAND  Result Value Ref Range Status   Specimen Description BLOOD LEFT  HAND  Final   Special Requests   Final    BOTTLES DRAWN AEROBIC ONLY Blood Culture results may not be optimal due to an inadequate volume of blood received in culture bottles   Culture   Final    NO GROWTH 5 DAYS Performed at [facility], [place] [address], [place], [place] [address]    Report Status 04/08/2019 FINAL  Final  Culture, blood (Routine X 2) w Reflex to ID Panel     Status: None   Collection Time: 04/03/19 12:35 PM   Specimen: BLOOD LEFT HAND  Result Value Ref Range Status   Specimen Description BLOOD LEFT HAND  Final   Special Requests   Final    BOTTLES DRAWN AEROBIC ONLY Blood Culture results may not be optimal due to an inadequate volume of blood received in culture bottles   Culture   Final    NO GROWTH 5 DAYS Performed at [facility], [place] [address], [place], [place] [address]    Report Status 04/08/2019 FINAL  Final  Culture, respiratory (non-expectorated)     Status: None   Collection Time: 04/07/19 11:10 AM   Specimen: Tracheal Aspirate; Respiratory  Result Value Ref Range Status   Specimen Description TRACHEAL ASPIRATE  Final   Special Requests NONE  Final   Gram Stain   Final    MODERATE WBC PRESENT, PREDOMINANTLY PMN ABUNDANT GRAM NEGATIVE RODS Performed at [facility], [place] [address], [place], [place] [address]    Culture ABUNDANT PSEUDOMONAS AERUGINOSA  Final   Report Status 04/09/2019 FINAL  Final   Organism ID, Bacteria PSEUDOMONAS AERUGINOSA  Final      Susceptibility   Pseudomonas aeruginosa - MIC*    CEFTAZIDIME 16 INTERMEDIATE Intermediate     CIPROFLOXACIN <=0.25 SENSITIVE Sensitive     GENTAMICIN 4 SENSITIVE Sensitive     IMIPENEM 1 SENSITIVE Sensitive     * ABUNDANT PSEUDOMONAS AERUGINOSA  Culture, blood (routine x 2)     Status: None   Collection Time: 04/07/19  2:07 PM   Specimen: BLOOD LEFT HAND  Result Value Ref Range Status   Specimen Description BLOOD LEFT HAND  Final   Special Requests   Final    BOTTLES DRAWN  AEROBIC ONLY Blood Culture adequate volume Performed at [facility], [place] [address], [place], [place] [address]    Culture NO GROWTH 5 DAYS  Final   Report Status 04/12/2019 FINAL  Final  Culture,  blood (routine x 2)     Status: None   Collection Time: 04/07/19  2:07 PM   Specimen: BLOOD LEFT HAND  Result Value Ref Range Status   Specimen Description BLOOD LEFT HAND  Final   Special Requests   Final    BOTTLES DRAWN AEROBIC ONLY Blood Culture adequate volume Performed at [facility], [address], [place],  [address]    Culture NO GROWTH 5 DAYS  Final   Report Status 04/12/2019 FINAL  Final    Studies/Results: CT ABDOMEN PELVIS WO CONTRAST  Result Date: 04/13/2019 CLINICAL DATA:  Hematuria EXAM: CT ABDOMEN AND PELVIS WITHOUT CONTRAST TECHNIQUE: Multidetector CT imaging of the abdomen and pelvis was performed following the standard protocol without IV contrast. COMPARISON:  None. FINDINGS: Motion and streak artifact present. Lower chest: Bilateral pleural effusions, larger on the left with bibasilar atelectasis. Hepatobiliary: No focal liver abnormality. The gallbladder is contracted. There is no biliary dilatation. Pancreas: Unremarkable Spleen: Unremarkable Adrenals/Urinary Tract: Adrenals are unremarkable. 2 mm nonobstructing calculus of the upper pole of the left kidney. Bladder is partially collapsed with Foley catheter present. There is debris within the bladder dependently. Stomach/Bowel: Stomach is within normal limits. Bowel is normal in caliber. There is distal colonic diverticulosis. Normal appendix. Vascular/Lymphatic: Aortic atherosclerosis. No enlarged abdominal or pelvic lymph nodes. Reproductive: Foci of metallic density at the level of prostate likely secondary to prior cancer treatment. Other: Left inguinal hernia containing fat and sigmoid colon. No ascites or free air. Musculoskeletal: Multilevel degenerative changes of the included spine. IMPRESSION:  Dependent debris within the partially decompressed bladder likely reflecting blood products. 2 mm nonobstructing left renal calculus. Colonic diverticulosis. Left inguinal hernia containing sigmoid colon. Bilateral pleural effusions. Electronically Signed   By: Macy Mis M.D.   On: 04/13/2019 11:42   DG Chest Port 1 View  Result Date: 04/14/2019 CLINICAL DATA:  History of CABG. EXAM: PORTABLE CHEST 1 VIEW COMPARISON:  04/13/2019 FINDINGS: Cardiopericardial silhouette is at upper limits of normal for size. No pulmonary edema. Bibasilar atelectasis noted with probable small effusions, left greater than right. Right PICC line tip overlies the distal SVC. Left subclavian central line tip overlies the mid SVC. Tracheostomy tube remains in place. Telemetry leads overlie the chest. IMPRESSION: Stable exam. Bibasilar atelectasis with small effusions, left greater than right. Electronically Signed   By: Misty Stanley M.D.   On: 04/14/2019 08:30   DG Chest Port 1 View  Result Date: 04/13/2019 CLINICAL DATA:  Status post coronary artery bypass graft. EXAM: PORTABLE CHEST 1 VIEW COMPARISON:  April 12, 2019. FINDINGS: Stable cardiomediastinal silhouette. Tracheostomy tube is unchanged in position. Left subclavian catheter is unchanged. Right-sided PICC line is unchanged. No pneumothorax is noted. Right lung is clear. Mild left pleural effusion is noted with associated atelectasis. Bony thorax is unremarkable. IMPRESSION: Stable support apparatus. Mild left pleural effusion is noted with associated atelectasis. Electronically Signed   By: Marijo Conception M.D.   On: 04/13/2019 08:57    Assessment/Plan:  Gross hematuria  04/02/2019, cleared on CBI. Recurred 04/13/2019 - clear today. Wean off CBI.      LOS: 31 days   Festus Aloe 04/14/2019, 2:06 PM

## 2019-04-14 NOTE — Progress Notes (Signed)
NAME:  Jesse Murphy, MRN:  [id number], DOB:  Apr 01, 1928, LOS: 75 ADMISSION DATE:  03/13/2019, CONSULTATION DATE:  2/2 REFERRING MD:  Nils Pyle, CHIEF COMPLAINT:  Dyspnea   Brief History   86 y/o male admitted 1/26, underwent CABG and L atrial appendage clipping on 1/29, moved to ICU on 2/1 for intubation.  Past Medical History  A fib, CAD, Prostate cancer, HTN  Significant Hospital Events   1/29 CABG 2/01 to ICU 2/05 Extubated 2/08 Reintubated 2/09 tracheostomy 2/09 Impella, PA catheter placed 2/11 CRRT initiated 2/16 leukocytosis, add vancomycin 2/17 start TC trials 2/19 urology placed 3 way foley, bleeding at rectal tube site 2/21 ATC initiated  2/22 1 unit PRBC 2/23 1 unit PRBC. Impella removed  Consults:  Nephrology CHF team Urology  Procedures:  Rt PICC 2/02 >> Trach 2/09 >> PA catheter 2/09 >> Lt Crisman HD cath 2/11 >> L Rad Aline 2/3 >>  Impella 2/10 >> 2/23  Significant Diagnostic Tests:  Echo 1/26 >> EF 30 to 35%  Micro Data:  SARS CoV2 PCR 1/26 >> negative Influenza PCR 1/26 >> negative Sputum 2/01 >> oral flora Sputum 2/08 >> Pseudomonas >> pan sensitive  Blood 2/16 >> negative Sputum 2/16 >> Pseudomonas sens to cipro, gent, imepenem BCx2 2/20 >> NG at 4 days  Antimicrobials:  Rocephin 2/01 >> 2/07 Cefepime 2/08 >> 2/10 Fortaz 2/10 >> 2/16 Vancomycin 2/16 >> 2/19 Meropenem 2/20 >> 2/26  Interim history/subjective:  Tolerating trach collar. No distress. Family at bedside. Remains on levophed.  Objective   Blood pressure (!) 105/37, pulse 80, temperature 97.9 F (36.6 C), temperature source Oral, resp. rate (!) 21, height 5\' 8"  (1.727 m), weight 88.4 kg, SpO2 94 %. CVP:  [7 mmHg-8 mmHg] 7 mmHg  FiO2 (%):  [28 %] 28 %   Intake/Output Summary (Last 24 hours) at 04/14/2019 1648 Last data filed at 04/14/2019 1000 Gross per 24 hour  Intake 21618.11 ml  Output 22195 ml  Net -576.89 ml   Filed Weights   04/13/19 0600 04/13/19 1430 04/14/19 0500   Weight: 89.8 kg 89.8 kg 88.4 kg   Physical Exam: General: Elderly-appearing, no acute distress HENT: [place], AT, OP clear, MMM Neck: Trach in place, c/d/i Eyes: EOMI, no scleral icterus Respiratory: Clear to auscultation bilaterally.  No crackles, wheezing or rales Cardiovascular: RRR, -M/R/G, no JVD GI: BS+, soft, nontender Extremities:2+ pitting edema x 4,-tenderness Neuro: Awake and alert, follows commands Psych: Normal mood, normal affect GU: Foley in place  Resolved Hospital Problem list     Assessment & Plan:   Acute Hypoxemic Respiratory Failure due to Acute pulmonary edema complicated by Pseudomonas HCAP. Small Left Pleural Post-pericardiotomy Effusion  S/p tracheostomy 2/9 Off vent >72 hours -Continue meropenem (2/20 - 2/26) -Trach collar as tolerated -Routine trach care -Speech following. Tolerating PMV for talking and eating -Will hold on changing trach to cuffless until acute critical illness has resolved  Cardiogenic Shock with acute systolic heart failure s/p Impella removal 2/23. New onset atrial fibrillation >> back in sinus rhythm. -Vasopressor and midodrine management per Cardiology -Continue ASA, lipitor, amiodarone  -heparin gtt on hold in setting of hematuria  Persistent Hematuria with retained clots - hematuria clearing -Appreciate Urology input. Weaning off CBI  AKI secondary to Cardiogenic Shock. CRRT clotted off 2/24. - HD per Nephrology  Anemia of critical illness. Some flexiseal trauma and hematuria as above -trend CBC  -transfuse for Hgb <7% or significant bleeding   DM2 with hyperglycemia, poorly controlled -SSI  -  TF coverage 5 units Q4, hold while NPO -lantus 26 units QD  Pressure wounds / Reported as Skin Tear 2/15 -WOC following, appreciate input    Best practice:  Diet: Dysphagia 1 diet DVT prophylaxis: SCD's GI prophylaxis: PPI Mobility: bed rest Code Status: Partial Code  Disposition: ICU  Labs    CMP Latest Ref Rng &  Units 04/14/2019 04/13/2019 04/12/2019  Glucose 70 - 99 mg/dL 84 145(H) 201(H)  BUN 8 - 23 mg/dL 59(H) 95(H) 73(H)  Creatinine 0.61 - 1.24 mg/dL 3.95(H) 4.90(H) 3.99(H)  Sodium 135 - 145 mmol/L 138 134(L) 135  Potassium 3.5 - 5.1 mmol/L 4.0 4.4 4.7  Chloride 98 - 111 mmol/L 102 99 99  CO2 22 - 32 mmol/L 25 20(L) 20(L)  Calcium 8.9 - 10.3 mg/dL 7.7(L) 7.6(L) 7.5(L)  Total Protein 6.5 - 8.1 g/dL - - -  Total Bilirubin 0.3 - 1.2 mg/dL - - -  Alkaline Phos 38 - 126 U/L - - -  AST 15 - 41 U/L - - -  ALT 0 - 44 U/L - - -    CBC Latest Ref Rng & Units 04/14/2019 04/13/2019 04/12/2019  WBC 4.0 - 10.5 K/uL 8.1 11.0(H) 11.9(H)  Hemoglobin 13.0 - 17.0 g/dL 8.4(L) 9.4(L) 9.7(L)  Hematocrit 39.0 - 52.0 % 26.3(L) 28.5(L) 29.2(L)  Platelets 150 - 400 K/uL 163 215 217    ABG    Component Value Date/Time   PHART 7.488 (H) 04/07/2019 0405   PCO2ART 34.4 04/07/2019 0405   PO2ART 101.0 04/07/2019 0405   HCO3 25.9 04/07/2019 0405   TCO2 27 04/07/2019 0405   ACIDBASEDEF 1.0 04/05/2019 0540   O2SAT 67.8 04/14/2019 1253    CBG (last 3)  Recent Labs    04/14/19 0409 04/14/19 0840 04/14/19 [place]   Rodman Pickle, M.D. [facility] 04/14/2019 4:48 PM   Please see Amion for pager number to reach on-call Pulmonary and Critical Care Team.

## 2019-04-14 NOTE — Progress Notes (Signed)
VASCULAR LAB PRELIMINARY  PRELIMINARY  PRELIMINARY  PRELIMINARY  Bilateral upper extremity venous duplex completed.    Preliminary report:  See CV proc for preliminary results.  Vedanth Sirico, RVT 04/14/2019, 2:27 PM

## 2019-04-15 LAB — MAGNESIUM: Magnesium: 2.3 mg/dL (ref 1.7–2.4)

## 2019-04-15 LAB — RENAL FUNCTION PANEL
Albumin: 2.1 g/dL — ABNORMAL LOW (ref 3.5–5.0)
Anion gap: 13 (ref 5–15)
BUN: 76 mg/dL — ABNORMAL HIGH (ref 8–23)
CO2: 23 mmol/L (ref 22–32)
Calcium: 7.7 mg/dL — ABNORMAL LOW (ref 8.9–10.3)
Chloride: 100 mmol/L (ref 98–111)
Creatinine, Ser: 5.34 mg/dL — ABNORMAL HIGH (ref 0.61–1.24)
GFR calc Af Amer: 10 mL/min — ABNORMAL LOW (ref >60)
GFR calc non Af Amer: 9 mL/min — ABNORMAL LOW (ref >60)
Glucose, Bld: 79 mg/dL (ref 70–99)
Phosphorus: 7.5 mg/dL — ABNORMAL HIGH (ref 2.5–4.6)
Potassium: 4.4 mmol/L (ref 3.5–5.1)
Sodium: 136 mmol/L (ref 135–145)

## 2019-04-15 LAB — CBC
HCT: 26.5 % — ABNORMAL LOW (ref 39.0–52.0)
Hemoglobin: 8.3 g/dL — ABNORMAL LOW (ref 13.0–17.0)
MCH: 29.7 pg (ref 26.0–34.0)
MCHC: 31.3 g/dL (ref 30.0–36.0)
MCV: 95 fL (ref 80.0–100.0)
Platelets: 182 10*3/uL (ref 150–400)
RBC: 2.79 MIL/uL — ABNORMAL LOW (ref 4.22–5.81)
RDW: 16.1 % — ABNORMAL HIGH (ref 11.5–15.5)
WBC: 8.5 10*3/uL (ref 4.0–10.5)
nRBC: 0 % (ref 0.0–0.2)

## 2019-04-15 LAB — GLUCOSE, CAPILLARY
Glucose-Capillary: 110 mg/dL — ABNORMAL HIGH (ref 70–99)
Glucose-Capillary: 113 mg/dL — ABNORMAL HIGH (ref 70–99)
Glucose-Capillary: 119 mg/dL — ABNORMAL HIGH (ref 70–99)
Glucose-Capillary: 120 mg/dL — ABNORMAL HIGH (ref 70–99)
Glucose-Capillary: 77 mg/dL (ref 70–99)
Glucose-Capillary: 85 mg/dL (ref 70–99)

## 2019-04-15 LAB — MRSA PCR SCREENING: MRSA by PCR: NEGATIVE

## 2019-04-15 MED ORDER — FUROSEMIDE 10 MG/ML IJ SOLN
160.0000 mg | Freq: Once | INTRAVENOUS | Status: AC
  Administered 2019-04-15: 160 mg via INTRAVENOUS
  Filled 2019-04-15: qty 16

## 2019-04-15 MED ORDER — METOLAZONE 5 MG PO TABS
5.0000 mg | ORAL_TABLET | Freq: Once | ORAL | Status: AC
  Administered 2019-04-15: 5 mg via ORAL
  Filled 2019-04-15: qty 1

## 2019-04-15 MED ORDER — CHLORHEXIDINE GLUCONATE CLOTH 2 % EX PADS
6.0000 | MEDICATED_PAD | Freq: Every day | CUTANEOUS | Status: DC
  Administered 2019-04-15 – 2019-04-24 (×2): 6 via TOPICAL

## 2019-04-15 MED ORDER — CHLORHEXIDINE GLUCONATE CLOTH 2 % EX PADS
6.0000 | MEDICATED_PAD | Freq: Every day | CUTANEOUS | Status: DC
  Administered 2019-04-18 – 2019-04-23 (×5): 6 via TOPICAL

## 2019-04-15 MED ORDER — SEVELAMER CARBONATE 800 MG PO TABS
1600.0000 mg | ORAL_TABLET | Freq: Three times a day (TID) | ORAL | Status: DC
  Administered 2019-04-15 – 2019-04-16 (×5): 1600 mg via ORAL
  Filled 2019-04-15 (×6): qty 2

## 2019-04-15 NOTE — Progress Notes (Signed)
Nephrology Progress Note:  Patient ID: Jesse Murphy, male   DOB: May 30, 1928, 86 y.o.   MRN: [id number]   S:  Reviewed charting.  Had HD for clearance on 2/26 with 235 mL UF (required inc in pressors and course later complicated by going back into rapid afib and received amio bolus per CTS).  Ins/outs not accurate due to bladder irrigation (haven't been able to stop due to clotting).  Urology saw 2/27 and rec weaning off of CBI - nursing hasn't tried to wean.  CVP b/w 5-7 overnight and Levo at 6 mcg/min.  Still on trach collar.   Noted the patient was given lasix 160 mg IV on 2/27.   Review of systems: Denies air hunger  Denies n/v Still getting irrigation - see above. indicates Needs suction  O:BP (!) 122/43 (BP Location: Left Arm)   Pulse 74   Temp 98.1 F (36.7 C) (Oral)   Resp 13   Ht _0  (1.727 m)   Wt 88.4 kg   SpO2 93%   BMI 29.63 kg/m   Intake/Output Summary (Last 24 hours) at 04/15/2019 0532 Last data filed at 04/15/2019 0400 Gross per 24 hour  Intake 33381.93 ml  Output 11370 ml  Net 22011.93 ml   Intake/Output: I/O last 3 completed shifts: In: 42763.6 [P.O.:240; I.V.:383.6; IRCVE:93810; IV Piggyback:100] Out: 17510 [Urine:25300; Drains:20; Other:285]  Intake/Output this shift:  Total I/O In: 9050.6 [I.V.:50.6; Other:9000] Out: 1750 [Urine:1750] Weight change:    Physical exam:   Gen: elderly male in bed chronically ill-appearing HEENT trach in place; on vent  CVS: S1 S2 no rub Resp: clear; unlabored at rest; trach collar Abd: soft/ NT/ND  Ext: 1-2+ edema Neuro - awake and interactive and follows commands, nods Psych - no anxiety or agitation  GU foley in place with irrigation Access: left chest nontunneled dialysis catheter   Recent Labs  Lab 04/10/19 1512 04/10/19 1512 04/11/19 0415 04/11/19 1546 04/12/19 0508 04/12/19 1351 04/13/19 0427 04/14/19 0243 04/15/19 0403  NA 134*   < > 135 137 137 135 134* 138 136  K 5.0   < > 5.7* 5.0 5.1 4.7 4.4  4.0 4.4  CL 102   < > 104 103 101 99 99 102 100  CO2 22   < > 20* 23 22 20* 20* 25 23  GLUCOSE 167*   < > 95 168* 151* 201* 145* 84 79  BUN 40*   < > 54* 38* 60* 73* 95* 59* 76*  CREATININE 2.25*   < > 3.25* 2.43* 3.49* 3.99* 4.90* 3.95* 5.34*  ALBUMIN 2.2*  --  2.0* 2.0* 1.9*  --  2.0* 2.2* 2.1*  CALCIUM 7.3*   < > 7.7* 7.5* 7.6* 7.5* 7.6* 7.7* 7.7*  PHOS 3.1  --  5.2* 3.9 6.0*  --  8.0* 5.9* 7.5*   < > = values in this interval not displayed.   Liver Function Tests: Recent Labs  Lab 04/13/19 0427 04/14/19 0243 04/15/19 0403  ALBUMIN 2.0* 2.2* 2.1*    CBC: Recent Labs  Lab 04/12/19 0508 04/12/19 0508 04/12/19 1351 04/12/19 1351 04/13/19 0427 04/14/19 0242 04/15/19 0403  WBC 8.8   < > 11.9*   < > 11.0* 8.1 8.5  HGB 7.5*   < > 9.7*   < > 9.4* 8.4* 8.3*  HCT 24.1*   < > 29.2*   < > 28.5* 26.3* 26.5*  MCV 94.5  --  91.0  --  91.1 93.3 95.0  PLT 179   < >  217   < > 215 163 182   < > = values in this interval not displayed.   Cardiac Enzymes: No results for input(s): CKTOTAL, CKMB, CKMBINDEX, TROPONINI in the last 168 hours. CBG: Recent Labs  Lab 04/14/19 1152 04/14/19 1641 04/14/19 2015 04/14/19 2347 04/15/19 0348  GLUCAP 155* 105* 88 85 77   IStudies/Results: CT ABDOMEN PELVIS WO CONTRAST  Result Date: 04/13/2019 CLINICAL DATA:  Hematuria EXAM: CT ABDOMEN AND PELVIS WITHOUT CONTRAST TECHNIQUE: Multidetector CT imaging of the abdomen and pelvis was performed following the standard protocol without IV contrast. COMPARISON:  None. FINDINGS: Motion and streak artifact present. Lower chest: Bilateral pleural effusions, larger on the left with bibasilar atelectasis. Hepatobiliary: No focal liver abnormality. The gallbladder is contracted. There is no biliary dilatation. Pancreas: Unremarkable Spleen: Unremarkable Adrenals/Urinary Tract: Adrenals are unremarkable. 2 mm nonobstructing calculus of the upper pole of the left kidney. Bladder is partially collapsed with Foley  catheter present. There is debris within the bladder dependently. Stomach/Bowel: Stomach is within normal limits. Bowel is normal in caliber. There is distal colonic diverticulosis. Normal appendix. Vascular/Lymphatic: Aortic atherosclerosis. No enlarged abdominal or pelvic lymph nodes. Reproductive: Foci of metallic density at the level of prostate likely secondary to prior cancer treatment. Other: Left inguinal hernia containing fat and sigmoid colon. No ascites or free air. Musculoskeletal: Multilevel degenerative changes of the included spine. IMPRESSION: Dependent debris within the partially decompressed bladder likely reflecting blood products. 2 mm nonobstructing left renal calculus. Colonic diverticulosis. Left inguinal hernia containing sigmoid colon. Bilateral pleural effusions. Electronically Signed   By: Macy Mis M.D.   On: 04/13/2019 11:42   DG Chest Port 1 View  Result Date: 04/14/2019 CLINICAL DATA:  History of CABG. EXAM: PORTABLE CHEST 1 VIEW COMPARISON:  04/13/2019 FINDINGS: Cardiopericardial silhouette is at upper limits of normal for size. No pulmonary edema. Bibasilar atelectasis noted with probable small effusions, left greater than right. Right PICC line tip overlies the distal SVC. Left subclavian central line tip overlies the mid SVC. Tracheostomy tube remains in place. Telemetry leads overlie the chest. IMPRESSION: Stable exam. Bibasilar atelectasis with small effusions, left greater than right. Electronically Signed   By: Misty Stanley M.D.   On: 04/14/2019 08:30   DG Chest Port 1 View  Result Date: 04/13/2019 CLINICAL DATA:  Status post coronary artery bypass graft. EXAM: PORTABLE CHEST 1 VIEW COMPARISON:  April 12, 2019. FINDINGS: Stable cardiomediastinal silhouette. Tracheostomy tube is unchanged in position. Left subclavian catheter is unchanged. Right-sided PICC line is unchanged. No pneumothorax is noted. Right lung is clear. Mild left pleural effusion is noted with  associated atelectasis. Bony thorax is unremarkable. IMPRESSION: Stable support apparatus. Mild left pleural effusion is noted with associated atelectasis. Electronically Signed   By: Marijo Conception M.D.   On: 04/13/2019 08:57   VAS [place] UPPER EXTREMITY VENOUS DUPLEX  Result Date: 04/14/2019 UPPER VENOUS STUDY  Indications: Edema Limitations: Bandages and trach collar. Comparison Study: No prior study on file Performing Technologist: Sharion Dove RVS  Examination Guidelines: A complete evaluation includes B-mode imaging, spectral Doppler, color Doppler, and power Doppler as needed of all accessible portions of each vessel. Bilateral testing is considered an integral part of a complete examination. Limited examinations for reoccurring indications may be performed as noted.  Right Findings: +----------+------------+---------+-----------+----------+-------+ RIGHT     CompressiblePhasicitySpontaneousPropertiesSummary +----------+------------+---------+-----------+----------+-------+ IJV           Full       Yes  Yes                      +----------+------------+---------+-----------+----------+-------+ Subclavian               Yes       Yes                      +----------+------------+---------+-----------+----------+-------+ Axillary      Full       Yes       Yes                      +----------+------------+---------+-----------+----------+-------+ Brachial      Full       Yes       Yes                      +----------+------------+---------+-----------+----------+-------+ Radial        Full                                          +----------+------------+---------+-----------+----------+-------+ Ulnar         Full                                          +----------+------------+---------+-----------+----------+-------+ Cephalic      Full                                          +----------+------------+---------+-----------+----------+-------+  Basilic       Full                                          +----------+------------+---------+-----------+----------+-------+  Left Findings: +----------+------------+---------+-----------+----------+---------------------+ LEFT      CompressiblePhasicitySpontaneousProperties       Summary        +----------+------------+---------+-----------+----------+---------------------+ IJV                      Yes       Yes                                    +----------+------------+---------+-----------+----------+---------------------+ Subclavian                                             Not visualized     +----------+------------+---------+-----------+----------+---------------------+ Axillary      Full       Yes       Yes                                    +----------+------------+---------+-----------+----------+---------------------+ Brachial      Full       Yes       Yes                                    +----------+------------+---------+-----------+----------+---------------------+  Radial        Full                                                        +----------+------------+---------+-----------+----------+---------------------+ Ulnar         Full                                                        +----------+------------+---------+-----------+----------+---------------------+ Cephalic      Full                                                        +----------+------------+---------+-----------+----------+---------------------+ Basilic       None                                   Acute, localized to                                                             [facility] only        +----------+------------+---------+-----------+----------+---------------------+  Summary:  Right: No evidence of deep vein thrombosis in the upper extremity. No evidence of superficial vein thrombosis in the upper extremity.  Left: No evidence of deep vein  thrombosis in the upper extremity. Findings consistent with acute superficial vein thrombosis involving the left basilic vein.  *See table(s) above for measurements and observations.  Diagnosing physician: Servando Snare MD Electronically signed by Servando Snare MD on 04/14/2019 at 3:57:34 PM.    Final    . sodium chloride   Intravenous Once  . amiodarone  200 mg Oral Daily  . aspirin  81 mg Oral Daily  . atorvastatin  80 mg Oral q1800  . chlorhexidine gluconate (MEDLINE KIT)  15 mL Mouth Rinse BID  . Chlorhexidine Gluconate Cloth  6 each Topical Daily  . Chlorhexidine Gluconate Cloth  6 each Topical Q0600  . [START ON 04/17/2019] darbepoetin (ARANESP) injection - NON-DIALYSIS  60 mcg Subcutaneous Q Tue-1800  . feeding supplement (NEPRO CARB STEADY)  237 mL Oral TID BM  . feeding supplement (PRO-STAT SUGAR FREE 64)  30 mL Oral BID  . Gerhardt's butt cream   Topical BID  . hydrocortisone   Rectal BID  . insulin aspart  0-24 Units Subcutaneous Q4H  . insulin glargine  26 Units Subcutaneous Daily  . mouth rinse  15 mL Mouth Rinse 10 times per day  . midodrine  15 mg Oral TID WC  . multivitamin  1 tablet Oral QHS  . pantoprazole (PROTONIX) IV  40 mg Intravenous Q24H  . sevelamer carbonate  800 mg Oral TID WC  . sodium chloride flush  10-40 mL Intracatheter Q12H  . tamsulosin  0.4 mg Oral Daily  BMET    Component Value Date/Time   NA 136 04/15/2019 0403   K 4.4 04/15/2019 0403   CL 100 04/15/2019 0403   CO2 23 04/15/2019 0403   GLUCOSE 79 04/15/2019 0403   BUN 76 (H) 04/15/2019 0403   CREATININE 5.34 (H) 04/15/2019 0403   CALCIUM 7.7 (L) 04/15/2019 0403   GFRNONAA 9 (L) 04/15/2019 0403   GFRAA 10 (L) 04/15/2019 0403   CBC    Component Value Date/Time   WBC 8.5 04/15/2019 0403   RBC 2.79 (L) 04/15/2019 0403   HGB 8.3 (L) 04/15/2019 0403   HCT 26.5 (L) 04/15/2019 0403   PLT 182 04/15/2019 0403   MCV 95.0 04/15/2019 0403   MCH 29.7 04/15/2019 0403   MCHC 31.3 04/15/2019 0403    RDW 16.1 (H) 04/15/2019 0403   LYMPHSABS 0.5 (L) 04/06/2019 1233   MONOABS 1.9 (H) 04/06/2019 1233   EOSABS 0.2 04/06/2019 1233   BASOSABS 0.0 04/06/2019 1233    Assessment/Plan:  1. AKI(Cr was 0.7 prior to surgery)- due to postoperative CHF/cardiogenic shock that was refractory to diuretics and started on CRRT on 03/29/19 for volume removal.  CRRT off on 2/23 - discussion with advanced CHF.  CRRT restarted 2/24 then off after clotting.  First HD on 2/26 with minimal UF and note increased pressors and rapid afib 1. No acute indication for HD today.  Follow needs daily and plan for next HD on 3/1.  No heparin  2. Per advanced CHF goal CVP of 5-8 (when was on CRRT) while not having to increase pressors 2. Acute systolic CHF/cardiogenic shock- EF 25-30%; s/p Impella - now out. optimize volume as above.  On midodrine - note increase to 15 mg TID. 3. CAD s/p CABG on 0/71/21 complicated by cardiogenic shock and ARF. 4. Acute hypoxic respiratory failure with pseudomonas pneumonia- s/p trach on 03/27/19. s/pabx per PCCM    5. PAF- s/p MAZE and LAA occlusion- therapy per CHF team 6. Hematuria- bladder irrigation per urology recommendations 7. ABLA- Hx Melena. Transfuse as needed. Have given RRBC's on 2/22 and 2/23. And 2/25.  Gave aranesp 2/23 40 mcg and have increased next dose to 60 mcg for 3/2 for now.   8. Protein malnutrition- moderate to severe 9. Secondary hyperparathyroidism/bone mineral -  On renvela.  Check intact PTH.   10. Disposition- Am concerned that he is a poor longterm dialysis candidate given his cardiogenic shock and trach.  Claudia Desanctis, MD 04/15/2019 5:49 AM

## 2019-04-15 NOTE — Progress Notes (Signed)
5 Days Post-Op Subjective: Patient reports he "can't breathe". Nurse in room to assess and pt looks well. In NAD. Nurse reports CBI off most of the day and she started when pt c/o bladder but urine is clear on very slow gtt (one drop every 2-3 seconds).   Objective: Vital signs in last 24 hours: Temp:  [98 F (36.7 C)-98.3 F (36.8 C)] 98 F (36.7 C) (02/28 1145) Pulse Rate:  [67-123] 78 (02/28 1900) Resp:  [13-28] 23 (02/28 1900) BP: (81-127)/(38-61) 122/45 (02/28 1900) SpO2:  [87 %-97 %] 95 % (02/28 1900) FiO2 (%):  [28 %] 28 % (02/28 1600) Weight:  [91.1 kg] 91.1 kg (02/28 0500)  Intake/Output from previous day: 02/27 0701 - 02/28 0700 In: 16967 [P.O.:240; I.V.:141] Out: 11230 [ELFYB:01751; Drains:10] Intake/Output this shift: No intake/output data recorded.  Physical Exam:  NAD Urine clear   Lab Results: Recent Labs    04/13/19 0427 04/14/19 0242 04/15/19 0403  HGB 9.4* 8.4* 8.3*  HCT 28.5* 26.3* 26.5*   BMET Recent Labs    04/14/19 0243 04/15/19 0403  NA 138 136  K 4.0 4.4  CL 102 100  CO2 25 23  GLUCOSE 84 79  BUN 59* 76*  CREATININE 3.95* 5.34*  CALCIUM 7.7* 7.7*   No results for input(s): LABPT, INR in the last 72 hours. No results for input(s): LABURIN in the last 72 hours. Results for orders placed or performed during the hospital encounter of 03/13/19  Respiratory Panel by RT PCR (Flu A&B, Covid) - Nasopharyngeal Swab     Status: None   Collection Time: 03/13/19  1:49 PM   Specimen: Nasopharyngeal Swab  Result Value Ref Range Status   SARS Coronavirus 2 by RT PCR NEGATIVE NEGATIVE Final    Comment: (NOTE) SARS-CoV-2 target nucleic acids are NOT DETECTED. The SARS-CoV-2 RNA is generally detectable in upper respiratoy specimens during the acute phase of infection. The lowest concentration of SARS-CoV-2 viral copies this assay can detect is 131 copies/mL. A negative result does not preclude SARS-Cov-2 infection and should not be used as the  sole basis for treatment or other patient management decisions. A negative result may occur with  improper specimen collection/handling, submission of specimen other than nasopharyngeal swab, presence of viral mutation(s) within the areas targeted by this assay, and inadequate number of viral copies (<131 copies/mL). A negative result must be combined with clinical observations, patient history, and epidemiological information. The expected result is Negative. Fact Sheet for Patients:  PinkCheek.be Fact Sheet for Healthcare Providers:  GravelBags.it This test is not yet ap proved or cleared by the [place] FDA and  has been authorized for detection and/or diagnosis of SARS-CoV-2 by FDA under an Emergency Use Authorization (EUA). This EUA will remain  in effect (meaning this test can be used) for the duration of the COVID-19 declaration under Section 564(b)(1) of the Act, 21 U.S.C. section 360bbb-3(b)(1), unless the authorization is terminated or revoked sooner.    Influenza A by PCR NEGATIVE NEGATIVE Final   Influenza B by PCR NEGATIVE NEGATIVE Final    Comment: (NOTE) The Xpert Xpress SARS-CoV-2/FLU/RSV assay is intended as an aid in  the diagnosis of influenza from Nasopharyngeal swab specimens and  should not be used as a sole basis for treatment. Nasal washings and  aspirates are unacceptable for Xpert Xpress SARS-CoV-2/FLU/RSV  testing. Fact Sheet for Patients: PinkCheek.be Fact Sheet for Healthcare Providers: GravelBags.it This test is not yet approved or cleared by the [place] and  has been authorized for detection and/or diagnosis of SARS-CoV-2 by  FDA under an Emergency Use Authorization (EUA). This EUA will remain  in effect (meaning this test can be used) for the duration of the  Covid-19 declaration under Section 564(b)(1) of the Act, 21   U.S.C. section 360bbb-3(b)(1), unless the authorization is  terminated or revoked. Performed at [facility], [address], [place], [place] [address]   MRSA PCR Screening     Status: None   Collection Time: 03/13/19  5:18 PM   Specimen: Nasal Mucosa; Nasopharyngeal  Result Value Ref Range Status   MRSA by PCR NEGATIVE NEGATIVE Final    Comment:        The GeneXpert MRSA Assay (FDA approved for NASAL specimens only), is one component of a comprehensive MRSA colonization surveillance program. It is not intended to diagnose MRSA infection nor to guide or monitor treatment for MRSA infections. Performed at [facility], [address] [place], [place] [address]   Surgical pcr screen     Status: None   Collection Time: 03/15/19  9:28 PM   Specimen: Nasal Mucosa; Nasal Swab  Result Value Ref Range Status   MRSA, PCR NEGATIVE NEGATIVE Final   Staphylococcus aureus NEGATIVE NEGATIVE Final    Comment: (NOTE) The Xpert SA Assay (FDA approved for NASAL specimens in patients 46 years of age and older), is one component of a comprehensive surveillance program. It is not intended to diagnose infection nor to guide or monitor treatment. Performed at [facility], [place] [address], [place], [place] [address]   Culture, respiratory (non-expectorated)     Status: None   Collection Time: 03/19/19  3:00 PM   Specimen: Tracheal Aspirate; Respiratory  Result Value Ref Range Status   Specimen Description TRACHEAL ASPIRATE  Final   Special Requests Normal  Final   Gram Stain   Final    ABUNDANT WBC PRESENT,BOTH PMN AND MONONUCLEAR FEW GRAM POSITIVE COCCI RARE GRAM NEGATIVE RODS    Culture   Final    FEW Consistent with normal respiratory flora. Performed at [facility], [place] [address] [place], [place] [address]    Report Status 03/21/2019 FINAL  Final  Culture, respiratory (non-expectorated)     Status: None   Collection Time: 03/26/19  6:29 AM   Specimen: Tracheal  Aspirate; Respiratory  Result Value Ref Range Status   Specimen Description TRACHEAL ASPIRATE  Final   Special Requests NONE  Final   Gram Stain   Final    ABUNDANT WBC PRESENT,BOTH PMN AND MONONUCLEAR NO ORGANISMS SEEN Performed at [facility], [address] [place],  [address]    Culture FEW PSEUDOMONAS AERUGINOSA  Final   Report Status 03/28/2019 FINAL  Final   Organism ID, Bacteria PSEUDOMONAS AERUGINOSA  Final      Susceptibility   Pseudomonas aeruginosa - MIC*    CEFTAZIDIME 8 SENSITIVE Sensitive     CIPROFLOXACIN <=0.25 SENSITIVE Sensitive     GENTAMICIN 4 SENSITIVE Sensitive     IMIPENEM 2 SENSITIVE Sensitive     * FEW PSEUDOMONAS AERUGINOSA  Culture, respiratory (non-expectorated)     Status: None   Collection Time: 04/03/19 11:08 AM   Specimen: Tracheal Aspirate; Respiratory  Result Value Ref Range Status   Specimen Description TRACHEAL ASPIRATE  Final   Special Requests NONE  Final   Gram Stain   Final    FEW WBC PRESENT, PREDOMINANTLY PMN FEW GRAM NEGATIVE RODS RARE YEAST  Culture   Final    FEW PSEUDOMONAS AERUGINOSA FEW CANDIDA TROPICALIS    Report Status 04/06/2019 FINAL  Final   Organism ID, Bacteria PSEUDOMONAS AERUGINOSA  Final      Susceptibility   Pseudomonas aeruginosa - MIC*    CEFTAZIDIME 32 RESISTANT Resistant     CIPROFLOXACIN <=0.25 SENSITIVE Sensitive     GENTAMICIN 4 SENSITIVE Sensitive     IMIPENEM Value in next row Sensitive      2 SENSITIVEPerformed at [facility], [address], [place], [place] [address]    * FEW PSEUDOMONAS AERUGINOSA  Culture, blood (Routine X 2) w Reflex to ID Panel     Status: None   Collection Time: 04/03/19 12:30 PM   Specimen: BLOOD LEFT HAND  Result Value Ref Range Status   Specimen Description BLOOD LEFT HAND  Final   Special Requests   Final    BOTTLES DRAWN AEROBIC ONLY Blood Culture results may not be optimal due to an inadequate volume of blood received in culture bottles    Culture   Final    NO GROWTH 5 DAYS Performed at [facility], [place] [address], [place], [place] [address]    Report Status 04/08/2019 FINAL  Final  Culture, blood (Routine X 2) w Reflex to ID Panel     Status: None   Collection Time: 04/03/19 12:35 PM   Specimen: BLOOD LEFT HAND  Result Value Ref Range Status   Specimen Description BLOOD LEFT HAND  Final   Special Requests   Final    BOTTLES DRAWN AEROBIC ONLY Blood Culture results may not be optimal due to an inadequate volume of blood received in culture bottles   Culture   Final    NO GROWTH 5 DAYS Performed at [facility], [place] [address], [place], [place] [address]    Report Status 04/08/2019 FINAL  Final  Culture, respiratory (non-expectorated)     Status: None   Collection Time: 04/07/19 11:10 AM   Specimen: Tracheal Aspirate; Respiratory  Result Value Ref Range Status   Specimen Description TRACHEAL ASPIRATE  Final   Special Requests NONE  Final   Gram Stain   Final    MODERATE WBC PRESENT, PREDOMINANTLY PMN ABUNDANT GRAM NEGATIVE RODS Performed at [facility], [place] [address] [place], [place] [address]    Culture ABUNDANT PSEUDOMONAS AERUGINOSA  Final   Report Status 04/09/2019 FINAL  Final   Organism ID, Bacteria PSEUDOMONAS AERUGINOSA  Final      Susceptibility   Pseudomonas aeruginosa - MIC*    CEFTAZIDIME 16 INTERMEDIATE Intermediate     CIPROFLOXACIN <=0.25 SENSITIVE Sensitive     GENTAMICIN 4 SENSITIVE Sensitive     IMIPENEM 1 SENSITIVE Sensitive     * ABUNDANT PSEUDOMONAS AERUGINOSA  Culture, blood (routine x 2)     Status: None   Collection Time: 04/07/19  2:07 PM   Specimen: BLOOD LEFT HAND  Result Value Ref Range Status   Specimen Description BLOOD LEFT HAND  Final   Special Requests   Final    BOTTLES DRAWN AEROBIC ONLY Blood Culture adequate volume Performed at [facility], [place] [address], [place], [place] [address]    Culture NO GROWTH 5 DAYS  Final   Report Status  04/12/2019 FINAL  Final  Culture, blood (routine x 2)     Status: None   Collection Time: 04/07/19  2:07 PM   Specimen: BLOOD LEFT HAND  Result Value  Ref Range Status   Specimen Description BLOOD LEFT HAND  Final   Special Requests   Final    BOTTLES DRAWN AEROBIC ONLY Blood Culture adequate volume Performed at [facility], [place] [address] [place], [place] [address]    Culture NO GROWTH 5 DAYS  Final   Report Status 04/12/2019 FINAL  Final  Culture, Urine     Status: None   Collection Time: 04/13/19  6:58 PM   Specimen: Urine, Catheterized  Result Value Ref Range Status   Specimen Description URINE, CATHETERIZED  Final   Special Requests NONE  Final   Culture   Final    NO GROWTH Performed at [facility], [address], [place], [place] [address]    Report Status 04/14/2019 FINAL  Final  MRSA PCR Screening     Status: None   Collection Time: 04/15/19 12:53 PM   Specimen: Nasal Mucosa; Nasopharyngeal  Result Value Ref Range Status   MRSA by PCR NEGATIVE NEGATIVE Final    Comment:        The GeneXpert MRSA Assay (FDA approved for NASAL specimens only), is one component of a comprehensive MRSA colonization surveillance program. It is not intended to diagnose MRSA infection nor to guide or monitor treatment for MRSA infections. Performed at [facility], [place] [address], [place], [place] [address]     Studies/Results: DG Chest Port 1 View  Result Date: 04/14/2019 CLINICAL DATA:  History of CABG. EXAM: PORTABLE CHEST 1 VIEW COMPARISON:  04/13/2019 FINDINGS: Cardiopericardial silhouette is at upper limits of normal for size. No pulmonary edema. Bibasilar atelectasis noted with probable small effusions, left greater than right. Right PICC line tip overlies the distal SVC. Left subclavian central line tip overlies the mid SVC. Tracheostomy tube remains in place. Telemetry leads overlie the chest. IMPRESSION: Stable exam. Bibasilar atelectasis with small  effusions, left greater than right. Electronically Signed   By: Misty Stanley M.D.   On: 04/14/2019 08:30   VAS [place] UPPER EXTREMITY VENOUS DUPLEX  Result Date: 04/14/2019 UPPER VENOUS STUDY  Indications: Edema Limitations: Bandages and trach collar. Comparison Study: No prior study on file Performing Technologist: Sharion Dove RVS  Examination Guidelines: A complete evaluation includes B-mode imaging, spectral Doppler, color Doppler, and power Doppler as needed of all accessible portions of each vessel. Bilateral testing is considered an integral part of a complete examination. Limited examinations for reoccurring indications may be performed as noted.  Right Findings: +----------+------------+---------+-----------+----------+-------+ RIGHT     CompressiblePhasicitySpontaneousPropertiesSummary +----------+------------+---------+-----------+----------+-------+ IJV           Full       Yes       Yes                      +----------+------------+---------+-----------+----------+-------+ Subclavian               Yes       Yes                      +----------+------------+---------+-----------+----------+-------+ Axillary      Full       Yes       Yes                      +----------+------------+---------+-----------+----------+-------+ Brachial      Full       Yes       Yes                      +----------+------------+---------+-----------+----------+-------+  Radial        Full                                          +----------+------------+---------+-----------+----------+-------+ Ulnar         Full                                          +----------+------------+---------+-----------+----------+-------+ Cephalic      Full                                          +----------+------------+---------+-----------+----------+-------+ Basilic       Full                                           +----------+------------+---------+-----------+----------+-------+  Left Findings: +----------+------------+---------+-----------+----------+---------------------+ LEFT      CompressiblePhasicitySpontaneousProperties       Summary        +----------+------------+---------+-----------+----------+---------------------+ IJV                      Yes       Yes                                    +----------+------------+---------+-----------+----------+---------------------+ Subclavian                                             Not visualized     +----------+------------+---------+-----------+----------+---------------------+ Axillary      Full       Yes       Yes                                    +----------+------------+---------+-----------+----------+---------------------+ Brachial      Full       Yes       Yes                                    +----------+------------+---------+-----------+----------+---------------------+ Radial        Full                                                        +----------+------------+---------+-----------+----------+---------------------+ Ulnar         Full                                                        +----------+------------+---------+-----------+----------+---------------------+ Cephalic      Full                                                        +----------+------------+---------+-----------+----------+---------------------+  Basilic       None                                   Acute, localized to                                                             [facility] only        +----------+------------+---------+-----------+----------+---------------------+  Summary:  Right: No evidence of deep vein thrombosis in the upper extremity. No evidence of superficial vein thrombosis in the upper extremity.  Left: No evidence of deep vein thrombosis in the upper extremity. Findings consistent with acute  superficial vein thrombosis involving the left basilic vein.  *See table(s) above for measurements and observations.  Diagnosing physician: Servando Snare MD Electronically signed by Servando Snare MD on 04/14/2019 at 3:57:34 PM.    Final     Assessment/Plan:  Gross hematuria - continue to wean CBI off and hand irrigate as needed.   Notify GU of any issues with foley drainage or return of significant hematuria/red urine.    LOS: 32 days   Festus Aloe 04/15/2019, 7:27 PM

## 2019-04-15 NOTE — Progress Notes (Signed)
NAME:  Jesse Murphy, MRN:  [id number], DOB:  1929/02/02, LOS: 6 ADMISSION DATE:  03/13/2019, CONSULTATION DATE:  2/2 REFERRING MD:  Nils Pyle, CHIEF COMPLAINT:  Dyspnea   Brief History   86 y/o male admitted 1/26, underwent CABG and L atrial appendage clipping on 1/29, moved to ICU on 2/1 for intubation.  Past Medical History  A fib, CAD, Prostate cancer, HTN  Significant Hospital Events   1/29 CABG 2/01 to ICU 2/05 Extubated 2/08 Reintubated 2/09 tracheostomy 2/09 Impella, PA catheter placed 2/11 CRRT initiated 2/16 leukocytosis, add vancomycin 2/17 start TC trials 2/19 urology placed 3 way foley, bleeding at rectal tube site 2/21 ATC initiated  2/22 1 unit PRBC 2/23 1 unit PRBC. Impella removed  Consults:  Nephrology CHF team Urology  Procedures:  Rt PICC 2/02 >> Trach 2/09 >> PA catheter 2/09 >> Lt Clifford HD cath 2/11 >> L Rad Aline 2/3 >>  Impella 2/10 >> 2/23  Significant Diagnostic Tests:  Echo 1/26 >> EF 30 to 35%  Micro Data:  SARS CoV2 PCR 1/26 >> negative Influenza PCR 1/26 >> negative Sputum 2/01 >> oral flora Sputum 2/08 >> Pseudomonas >> pan sensitive  Blood 2/16 >> negative Sputum 2/16 >> Pseudomonas sens to cipro, gent, imepenem Resp 2/20>> PA sens cipro, gent, impipenem BCx2 2/20 >> neg  Antimicrobials:  Rocephin 2/01 >> 2/07 Cefepime 2/08 >> 2/10 Fortaz 2/10 >> 2/16 Vancomycin 2/16 >> 2/19 Meropenem 2/20 >> 2/26  Interim history/subjective:  Tolerating trach collar. Tolerates PMV. Denies shortness of breath. Minimal to no secretions per staff No further hematuria overnight per RN. When weaning CBI, patient reports abdominal pain Remains on levophed Returned to atrial fib but rate-controlled  Objective   Blood pressure (!) 116/45, pulse 78, temperature 98.1 F (36.7 C), temperature source Oral, resp. rate (!) 24, height 5\' 8"  (1.727 m), weight 91.1 kg, SpO2 92 %. CVP:  [5 mmHg-13 mmHg] 13 mmHg  FiO2 (%):  [28 %] 28 %    Intake/Output Summary (Last 24 hours) at 04/15/2019 0722 Last data filed at 04/15/2019 0400 Gross per 24 hour  Intake 33375.36 ml  Output 11010 ml  Net 22365.36 ml   Filed Weights   04/13/19 1430 04/14/19 0500 04/15/19 0500  Weight: 89.8 kg 88.4 kg 91.1 kg   Physical Exam: General: Elderly-appearing, no acute distress HENT: [place], AT, OP clear, MMM Neck: Cuffed trach in place, c/d/i Eyes: EOMI, no scleral icterus Respiratory: Clear to auscultation bilaterally.  No crackles, wheezing or rales Cardiovascular: RRR, -M/R/G, no JVD GI: BS+, soft, nontender Extremities: 2+ pitting edema x4 ,-tenderness Neuro: AAO x4, CNII-XII grossly intact GU: Foley in place with clear urine  Resolved Hospital Problem list     Assessment & Plan:   Acute Hypoxemic Respiratory Failure due to acute pulmonary edema, pna Pseudomonas HCAP s/p completion meropenem Small Left Pleural Post-pericardiotomy Effusion  S/p tracheostomy 2/9 Off vent >72 hours. -Trach collar as tolerated -Routine trach care -Speech following. Tolerating PMV for talking and eating -Will hold on changing trach to cuffless until acute critical illness has resolved  Cardiogenic Shock with acute systolic heart failure s/p Impella removal 2/23. Atrial fibrillation -Vasopressor and midodrine management per Cardiology -Continue ASA, lipitor, amiodarone  -heparin gtt on hold in setting of hematuria  Persistent Hematuria with retained clots - hematuria clearing -Appreciate Urology input. Weaning off CBI  AKI secondary to Cardiogenic Shock. CRRT clotted off 2/24. - HD per Nephrology. Next planned 3/1  Anemia of critical illness. Some flexiseal  trauma and hematuria as above -trend CBC  -transfuse for Hgb <7% or significant bleeding  -Holding heparin gtt  DM2 with hyperglycemia, poorly controlled -SSI  -lantus 26 units QD -SSI  Pressure wounds / Reported as Skin Tear 2/15 -WOC following, appreciate input    Best  practice:  Diet: Dysphagia 1 diet DVT prophylaxis: SCD's GI prophylaxis: PPI Mobility: bed rest Code Status: Partial Code  Disposition: ICU  Labs    CMP Latest Ref Rng & Units 04/15/2019 04/14/2019 04/13/2019  Glucose 70 - 99 mg/dL 79 84 145(H)  BUN 8 - 23 mg/dL 76(H) 59(H) 95(H)  Creatinine 0.61 - 1.24 mg/dL 5.34(H) 3.95(H) 4.90(H)  Sodium 135 - 145 mmol/L 136 138 134(L)  Potassium 3.5 - 5.1 mmol/L 4.4 4.0 4.4  Chloride 98 - 111 mmol/L 100 102 99  CO2 22 - 32 mmol/L 23 25 20(L)  Calcium 8.9 - 10.3 mg/dL 7.7(L) 7.7(L) 7.6(L)  Total Protein 6.5 - 8.1 g/dL - - -  Total Bilirubin 0.3 - 1.2 mg/dL - - -  Alkaline Phos 38 - 126 U/L - - -  AST 15 - 41 U/L - - -  ALT 0 - 44 U/L - - -    CBC Latest Ref Rng & Units 04/15/2019 04/14/2019 04/13/2019  WBC 4.0 - 10.5 K/uL 8.5 8.1 11.0(H)  Hemoglobin 13.0 - 17.0 g/dL 8.3(L) 8.4(L) 9.4(L)  Hematocrit 39.0 - 52.0 % 26.5(L) 26.3(L) 28.5(L)  Platelets 150 - 400 K/uL 182 163 215    ABG    Component Value Date/Time   PHART 7.488 (H) 04/07/2019 0405   PCO2ART 34.4 04/07/2019 0405   PO2ART 101.0 04/07/2019 0405   HCO3 25.9 04/07/2019 0405   TCO2 27 04/07/2019 0405   ACIDBASEDEF 1.0 04/05/2019 0540   O2SAT 67.8 04/14/2019 1253    CBG (last 3)  Recent Labs    04/14/19 2015 04/14/19 2347 04/15/19 [place] 85 77   Rodman Pickle, M.D. [facility] Pulmonary/Critical Care Medicine 04/15/2019 7:22 AM   Please see Amion for pager number to reach on-call Pulmonary and Critical Care Team.

## 2019-04-15 NOTE — Progress Notes (Signed)
5 Days Post-Op Procedure(s) (LRB): REMOVAL OF IMPELLA LEFT VENTRICULAR ASSIST DEVICE WITH INSERTION OF RIGHT FEMORAL ARTERIAL LINE (N/A) Subjective: No complaints.  Ate breakfast, taking supplements.  On NE 6 mcg  Objective: Vital signs in last 24 hours: Temp:  [97.7 F (36.5 C)-98.3 F (36.8 C)] 98 F (36.7 C) (02/28 0745) Pulse Rate:  [67-123] 82 (02/28 0854) Cardiac Rhythm: Normal sinus rhythm (02/28 0400) Resp:  [13-27] 20 (02/28 0854) BP: (81-128)/(33-96) 115/46 (02/28 0854) SpO2:  [89 %-98 %] 91 % (02/28 0845) FiO2 (%):  [28 %] 28 % (02/28 0854) Weight:  [91.1 kg] 91.1 kg (02/28 0500)  Hemodynamic parameters for last 24 hours: CVP:  [5 mmHg-13 mmHg] 9 mmHg  Intake/Output from previous day: 02/27 0701 - 02/28 0700 In: 16384 [P.O.:240; I.V.:141] Out: 11230 [TXMIW:80321; Drains:10] Intake/Output this shift: Total I/O In: 245.6 [P.O.:240; I.V.:5.6] Out: -   General appearance: alert and cooperative Neurologic: intact Heart: regular rate and rhythm, S1, S2 normal, no murmur Lungs: clear to auscultation bilaterally Abdomen: soft, non-tender; bowel sounds normal Extremities: anasarca Wound: chest incision well healed. Impella site dry  Lab Results: Recent Labs    04/14/19 0242 04/15/19 0403  WBC 8.1 8.5  HGB 8.4* 8.3*  HCT 26.3* 26.5*  PLT 163 182   BMET:  Recent Labs    04/14/19 0243 04/15/19 0403  NA 138 136  K 4.0 4.4  CL 102 100  CO2 25 23  GLUCOSE 84 79  BUN 59* 76*  CREATININE 3.95* 5.34*  CALCIUM 7.7* 7.7*    PT/INR: No results for input(s): LABPROT, INR in the last 72 hours. ABG    Component Value Date/Time   PHART 7.488 (H) 04/07/2019 0405   HCO3 25.9 04/07/2019 0405   TCO2 27 04/07/2019 0405   ACIDBASEDEF 1.0 04/05/2019 0540   O2SAT 67.8 04/14/2019 1253   CBG (last 3)  Recent Labs    04/14/19 2347 04/15/19 0348 04/15/19 0813  GLUCAP 85 77 119*    Assessment/Plan:  Remains on NE at 6 mcg, down from 7 yesterday. On oral  midodrine.  Stable on trach collar. Vocalizing well and eating reasonable well. Probably needs speech therapy reevaluation to advance diet.  HD per nephrology. Creat up to 5.3 today. BUN 76.  Bladder irrigation has been completely clear so on hold.  He needs to mobilize OOB, Continue PT.     LOS: 32 days    Gaye Pollack 04/15/2019

## 2019-04-15 NOTE — Plan of Care (Signed)
  Problem: Education: Goal: Knowledge of General Education information will improve Description: Including pain rating scale, medication(s)/side effects and non-pharmacologic comfort measures Outcome: Progressing   Problem: Health Behavior/Discharge Planning: Goal: Ability to manage health-related needs will improve Outcome: Progressing   Problem: Clinical Measurements: Goal: Ability to maintain clinical measurements within normal limits will improve Outcome: Progressing Goal: Will remain free from infection Outcome: Progressing Goal: Diagnostic test results will improve Outcome: Progressing Goal: Respiratory complications will improve Outcome: Progressing Goal: Cardiovascular complication will be avoided Outcome: Progressing   Problem: Activity: Goal: Risk for activity intolerance will decrease Outcome: Progressing   Problem: Nutrition: Goal: Adequate nutrition will be maintained Outcome: Progressing   Problem: Coping: Goal: Level of anxiety will decrease Outcome: Progressing   Problem: Elimination: Goal: Will not experience complications related to bowel motility Outcome: Progressing Goal: Will not experience complications related to urinary retention Outcome: Progressing   Problem: Pain Managment: Goal: General experience of comfort will improve Outcome: Progressing   Problem: Safety: Goal: Ability to remain free from injury will improve Outcome: Progressing   Problem: Skin Integrity: Goal: Risk for impaired skin integrity will decrease Outcome: Progressing   Problem: Education: Goal: Knowledge of disease or condition will improve Outcome: Progressing Goal: Understanding of medication regimen will improve Outcome: Progressing Goal: Individualized Educational Video(s) Outcome: Progressing   Problem: Activity: Goal: Ability to tolerate increased activity will improve Outcome: Progressing   Problem: Cardiac: Goal: Ability to achieve and maintain  adequate cardiopulmonary perfusion will improve Outcome: Progressing   Problem: Health Behavior/Discharge Planning: Goal: Ability to safely manage health-related needs after discharge will improve Outcome: Progressing   Problem: Education: Goal: Understanding of CV disease, CV risk reduction, and recovery process will improve Outcome: Progressing Goal: Individualized Educational Video(s) Outcome: Progressing   Problem: Activity: Goal: Ability to return to baseline activity level will improve Outcome: Progressing   Problem: Cardiovascular: Goal: Ability to achieve and maintain adequate cardiovascular perfusion will improve Outcome: Progressing Goal: Vascular access site(s) Level 0-1 will be maintained Outcome: Progressing   Problem: Health Behavior/Discharge Planning: Goal: Ability to safely manage health-related needs after discharge will improve Outcome: Progressing   Problem: Education: Goal: Knowledge about tracheostomy care/management will improve Outcome: Progressing   Problem: Activity: Goal: Ability to tolerate increased activity will improve Outcome: Progressing   Problem: Health Behavior/Discharge Planning: Goal: Ability to manage tracheostomy will improve Outcome: Progressing   Problem: Respiratory: Goal: Patent airway maintenance will improve Outcome: Progressing   Problem: Role Relationship: Goal: Ability to communicate will improve Outcome: Progressing   Problem: Education: Goal: Knowledge of disease and its progression will improve Outcome: Progressing   Problem: Health Behavior/Discharge Planning: Goal: Ability to manage health-related needs will improve Outcome: Progressing   Problem: Clinical Measurements: Goal: Complications related to the disease process or treatment will be avoided or minimized Outcome: Progressing Goal: Dialysis access will remain free of complications Outcome: Progressing   Problem: Activity: Goal: Activity  intolerance will improve Outcome: Progressing   Problem: Fluid Volume: Goal: Fluid volume balance will be maintained or improved Outcome: Progressing   Problem: Nutritional: Goal: Ability to make appropriate dietary choices will improve Outcome: Progressing   Problem: Respiratory: Goal: Respiratory symptoms related to disease process will be avoided Outcome: Progressing   Problem: Self-Concept: Goal: Body image disturbance will be avoided or minimized Outcome: Progressing   Problem: Urinary Elimination: Goal: Progression of disease will be identified and treated Outcome: Progressing

## 2019-04-15 NOTE — Progress Notes (Signed)
Patient ID: Jesse Murphy, male   DOB: 01/12/1929, 86 y.o.   MRN: [id number] T[facility] Evening Rounds:  Hemodynamically stable but still on NE 6 mcg.  CVP 9.  Nephrology planning HD tomorrow.

## 2019-04-15 NOTE — Progress Notes (Signed)
Patient ID: Jesse Murphy, male   DOB: May 06, 1928, 86 y.o.   MRN: [id number]    Advanced Heart Failure Rounding Note  PCP-Cardiologist: Kate Sable, MD   Subjective:    Impella removed 2/23. Intraoperative TEE showed EF ~40%  On TC  Feels ok. Eating some. Denies SOB. Hands swollen.  Got lasix 160 IV yesterday only 50cc out  Creatinine 3.9 -> 5.3  Hematuria has cleared.    On NE 7 mcg (down from 6) + midodrine 15 tid. MAPs 70-80s.   Had HD for clearance on 2/26 with 235 mL UF; pt had to increase norepi from 2 to 12 mcg and went back into rapid afib. Now back in NSR on IV amio. No dialysis planned today.   K 4.4 Hgb 8.3  Objective:   Weight Range: 91.1 kg Body mass index is 30.54 kg/m.   Vital Signs:   Temp:  [97.7 F (36.5 C)-98.3 F (36.8 C)] 98 F (36.7 C) (02/28 0745) Pulse Rate:  [67-123] 82 (02/28 0854) Resp:  [13-27] 20 (02/28 0854) BP: (81-128)/(35-54) 115/46 (02/28 0854) SpO2:  [89 %-97 %] 91 % (02/28 0845) FiO2 (%):  [28 %] 28 % (02/28 0854) Weight:  [91.1 kg] 91.1 kg (02/28 0500) Last BM Date: 04/12/19  Weight change: Filed Weights   04/13/19 1430 04/14/19 0500 04/15/19 0500  Weight: 89.8 kg 88.4 kg 91.1 kg    Intake/Output:   Intake/Output Summary (Last 24 hours) at 04/15/2019 1055 Last data filed at 04/15/2019 1000 Gross per 24 hour  Intake 21731.61 ml  Output 5370 ml  Net 16361.61 ml      Physical Exam   General:  Sitting up in bed. No resp difficulty HEENT: normal Neck: supple. +trach collar Carotids 2+ bilat; no bruits. No lymphadenopathy or thryomegaly appreciated. Cor: PMI nondisplaced. Regular rate & rhythm. No rubs, gallops or murmurs. R chest impella wound well healed. JP drain dry.  [place] trialysis Lungs: coarse Abdomen: soft, nontender, nondistended. No hepatosplenomegaly. No bruits or masses. Good bowel sounds. Extremities: no cyanosis, clubbing, rash, tr edema + 2+ RUE edema Neuro: awake on TC. Follows  commands   Telemetry   NSR 80-90s Personally reviewed  Labs    CBC Recent Labs    04/14/19 0242 04/15/19 0403  WBC 8.1 8.5  HGB 8.4* 8.3*  HCT 26.3* 26.5*  MCV 93.3 95.0  PLT 163 638   Basic Metabolic Panel Recent Labs    04/13/19 0427 04/14/19 0242 04/14/19 0243 04/15/19 0403  NA   < >  --  138 136  K   < >  --  4.0 4.4  CL   < >  --  102 100  CO2   < >  --  25 23  GLUCOSE   < >  --  84 79  BUN   < >  --  59* 76*  CREATININE   < >  --  3.95* 5.34*  CALCIUM   < >  --  7.7* 7.7*  MG  --  2.1  --  2.3  PHOS   < >  --  5.9* 7.5*   < > = values in this interval not displayed.   Liver Function Tests Recent Labs    04/14/19 0243 04/15/19 0403  ALBUMIN 2.2* 2.1*   No results for input(s): LIPASE, AMYLASE in the last 72 hours. Cardiac Enzymes No results for input(s): CKTOTAL, CKMB, CKMBINDEX, TROPONINI in the last 72 hours.  BNP: BNP (last 3 results)  Recent Labs    03/22/19 0401 03/26/19 0626 03/27/19 1255  BNP 340.4* 687.5* 800.4*    ProBNP (last 3 results) No results for input(s): PROBNP in the last 8760 hours.   D-Dimer No results for input(s): DDIMER in the last 72 hours. Hemoglobin A1C No results for input(s): HGBA1C in the last 72 hours. Fasting Lipid Panel No results for input(s): CHOL, HDL, LDLCALC, TRIG, CHOLHDL, LDLDIRECT in the last 72 hours. Thyroid Function Tests No results for input(s): TSH, T4TOTAL, T3FREE, THYROIDAB in the last 72 hours.  Invalid input(s): FREET3  Other results:   Imaging    VAS [place] UPPER EXTREMITY VENOUS DUPLEX  Result Date: 04/14/2019 UPPER VENOUS STUDY  Indications: Edema Limitations: Bandages and trach collar. Comparison Study: No prior study on file Performing Technologist: Sharion Dove RVS  Examination Guidelines: A complete evaluation includes B-mode imaging, spectral Doppler, color Doppler, and power Doppler as needed of all accessible portions of each vessel. Bilateral testing is considered an  integral part of a complete examination. Limited examinations for reoccurring indications may be performed as noted.  Right Findings: +----------+------------+---------+-----------+----------+-------+ RIGHT     CompressiblePhasicitySpontaneousPropertiesSummary +----------+------------+---------+-----------+----------+-------+ IJV           Full       Yes       Yes                      +----------+------------+---------+-----------+----------+-------+ Subclavian               Yes       Yes                      +----------+------------+---------+-----------+----------+-------+ Axillary      Full       Yes       Yes                      +----------+------------+---------+-----------+----------+-------+ Brachial      Full       Yes       Yes                      +----------+------------+---------+-----------+----------+-------+ Radial        Full                                          +----------+------------+---------+-----------+----------+-------+ Ulnar         Full                                          +----------+------------+---------+-----------+----------+-------+ Cephalic      Full                                          +----------+------------+---------+-----------+----------+-------+ Basilic       Full                                          +----------+------------+---------+-----------+----------+-------+  Left Findings: +----------+------------+---------+-----------+----------+---------------------+ LEFT      CompressiblePhasicitySpontaneousProperties       Summary        +----------+------------+---------+-----------+----------+---------------------+  IJV                      Yes       Yes                                    +----------+------------+---------+-----------+----------+---------------------+ Subclavian                                             Not visualized      +----------+------------+---------+-----------+----------+---------------------+ Axillary      Full       Yes       Yes                                    +----------+------------+---------+-----------+----------+---------------------+ Brachial      Full       Yes       Yes                                    +----------+------------+---------+-----------+----------+---------------------+ Radial        Full                                                        +----------+------------+---------+-----------+----------+---------------------+ Ulnar         Full                                                        +----------+------------+---------+-----------+----------+---------------------+ Cephalic      Full                                                        +----------+------------+---------+-----------+----------+---------------------+ Basilic       None                                   Acute, localized to                                                             [facility] only        +----------+------------+---------+-----------+----------+---------------------+  Summary:  Right: No evidence of deep vein thrombosis in the upper extremity. No evidence of superficial vein thrombosis in the upper extremity.  Left: No evidence of deep vein thrombosis in the upper extremity. Findings consistent with acute superficial vein thrombosis involving the left basilic vein.  *See table(s) above for measurements and observations.  Diagnosing  physician: Servando Snare MD Electronically signed by Servando Snare MD on 04/14/2019 at 3:57:34 PM.    Final      Medications:     Scheduled Medications: . sodium chloride   Intravenous Once  . amiodarone  200 mg Oral Daily  . aspirin  81 mg Oral Daily  . atorvastatin  80 mg Oral q1800  . chlorhexidine gluconate (MEDLINE KIT)  15 mL Mouth Rinse BID  . Chlorhexidine Gluconate Cloth  6 each Topical Q0600  . [START ON 04/17/2019]  darbepoetin (ARANESP) injection - NON-DIALYSIS  60 mcg Subcutaneous Q Tue-1800  . feeding supplement (NEPRO CARB STEADY)  237 mL Oral TID BM  . feeding supplement (PRO-STAT SUGAR FREE 64)  30 mL Oral BID  . Gerhardt's butt cream   Topical BID  . hydrocortisone   Rectal BID  . insulin aspart  0-24 Units Subcutaneous Q4H  . insulin glargine  26 Units Subcutaneous Daily  . mouth rinse  15 mL Mouth Rinse 10 times per day  . midodrine  15 mg Oral TID WC  . multivitamin  1 tablet Oral QHS  . pantoprazole (PROTONIX) IV  40 mg Intravenous Q24H  . sevelamer carbonate  1,600 mg Oral TID WC  . sodium chloride flush  10-40 mL Intracatheter Q12H  . tamsulosin  0.4 mg Oral Daily    Infusions: . sodium chloride    . sodium chloride Stopped (04/04/19 1736)  . sodium chloride Stopped (04/13/19 1216)  . sodium chloride    . sodium chloride    . norepinephrine (LEVOPHED) Adult infusion 6 mcg/min (04/15/19 1000)    PRN Medications: sodium chloride, sodium chloride, sodium chloride, oxyCODONE **AND** acetaminophen, alteplase, dextrose, fentaNYL (SUBLIMAZE) injection, heparin, hyoscyamine, lidocaine (PF), lidocaine-prilocaine, ondansetron (ZOFRAN) IV, pentafluoroprop-tetrafluoroeth, Resource ThickenUp Clear, sodium chloride flush, sodium chloride flush    Assessment/Plan   1. Acute systolic HF -> Cardiogenic shock - post-op echo on 03/20/19 EF 25-30% (pre-op 30-35%. In 12/2017 EF normal) - Required Impella support post CABG. Impella removed 2/23 - Remains on dual pressors/inotropes.Norepi 6 mcg +midodrine 15 tid. CO-OX not drawn. Will order - Wean NE as toelrated - CVP 9-10  - No HD planned today. Received lasix yesterday. Minimal response. Will repeat lasix today. Add metolazone 5.   2. CAD s/p CABG this admit (LIMA->LAD, SVG->OM, SVG->RCA on 03/15/19) - No s/s angina - Continue ASA +statin  3. Acute hypoxic respiratory failure with Pseudomonas PNA - s/p trach on 03/27/19 - on TC with PMV today.  Doing well - completed meropenem for pseudomonas PNA  4. PAF - s/p MAZE and LAA occlusion - continues to go in/out of AF - In NSR today On amio 200 mg daily. Will increase to 200 bid - Off systemic heparin with hematuria and melena/ anemia.  - Ideally would like to get on DVT prophylaxis dose soon   5. AKI  - due to shock/ATN - [facility]ed [facility] 03/29/19 per Nephrology.  - Had HD on 2/26. None planned today - Received test dose lasix yesterday. Will repeat today with metolazone - Remain concerned about ability to tolerate HD with low BP. However we are making progress   6. Hematuria  -Off heparin drip.  - Urology following. CBI stopped 2/27 - Seems improved today.  7. Melena - No melena overnight.   8. Anemia -Hematuria/melena. -Hgb 8.4 -> 8.2 today  9. Debility, severe - continue PT - repeat OOB and in chair today  10. Hyperkalemia - K 4.4 today   11. UE  swelling - u/s negative for DVT. Wrap/elevate  CRITICAL CARE Performed by: Glori Bickers  Total critical care time: 35 minutes  Critical care time was exclusive of separately billable procedures and treating other patients.  Critical care was necessary to treat or prevent imminent or life-threatening deterioration.  Critical care was time spent personally by me (independent of midlevel providers or residents) on the following activities: development of treatment plan with patient and/or surrogate as well as nursing, discussions with consultants, evaluation of patient's response to treatment, examination of patient, obtaining history from patient or surrogate, ordering and performing treatments and interventions, ordering and review of laboratory studies, ordering and review of radiographic studies, pulse oximetry and re-evaluation of patient's condition.    Glori Bickers, MD  10:55 AM

## 2019-04-16 LAB — CBC
HCT: 27.7 % — ABNORMAL LOW (ref 39.0–52.0)
Hemoglobin: 8.9 g/dL — ABNORMAL LOW (ref 13.0–17.0)
MCH: 29.8 pg (ref 26.0–34.0)
MCHC: 32.1 g/dL (ref 30.0–36.0)
MCV: 92.6 fL (ref 80.0–100.0)
Platelets: 233 10*3/uL (ref 150–400)
RBC: 2.99 MIL/uL — ABNORMAL LOW (ref 4.22–5.81)
RDW: 15.7 % — ABNORMAL HIGH (ref 11.5–15.5)
WBC: 9.5 10*3/uL (ref 4.0–10.5)
nRBC: 0 % (ref 0.0–0.2)

## 2019-04-16 LAB — MAGNESIUM: Magnesium: 2.5 mg/dL — ABNORMAL HIGH (ref 1.7–2.4)

## 2019-04-16 LAB — PTH, INTACT AND CALCIUM
Calcium, Total (PTH): 7.4 mg/dL — ABNORMAL LOW (ref 8.6–10.2)
PTH: 116 pg/mL — ABNORMAL HIGH (ref 15–65)

## 2019-04-16 LAB — GLUCOSE, CAPILLARY
Glucose-Capillary: 111 mg/dL — ABNORMAL HIGH (ref 70–99)
Glucose-Capillary: 133 mg/dL — ABNORMAL HIGH (ref 70–99)
Glucose-Capillary: 163 mg/dL — ABNORMAL HIGH (ref 70–99)
Glucose-Capillary: 174 mg/dL — ABNORMAL HIGH (ref 70–99)
Glucose-Capillary: 74 mg/dL (ref 70–99)
Glucose-Capillary: 78 mg/dL (ref 70–99)
Glucose-Capillary: 88 mg/dL (ref 70–99)

## 2019-04-16 LAB — POCT I-STAT, CHEM 8
BUN: 32 mg/dL — ABNORMAL HIGH (ref 8–23)
BUN: 32 mg/dL — ABNORMAL HIGH (ref 8–23)
BUN: 30 mg/dL — ABNORMAL HIGH (ref 8–23)
BUN: 33 mg/dL — ABNORMAL HIGH (ref 8–23)
Calcium, Ion: 0.54 mmol/L — CL (ref 1.15–1.40)
Calcium, Ion: 1.52 mmol/L (ref 1.15–1.40)
Calcium, Ion: 0.47 mmol/L — CL (ref 1.15–1.40)
Calcium, Ion: 1.32 mmol/L (ref 1.15–1.40)
Chloride: 89 mmol/L — ABNORMAL LOW (ref 98–111)
Chloride: 95 mmol/L — ABNORMAL LOW (ref 98–111)
Chloride: 92 mmol/L — ABNORMAL LOW (ref 98–111)
Chloride: 93 mmol/L — ABNORMAL LOW (ref 98–111)
Creatinine, Ser: 3 mg/dL — ABNORMAL HIGH (ref 0.61–1.24)
Creatinine, Ser: 3.2 mg/dL — ABNORMAL HIGH (ref 0.61–1.24)
Creatinine, Ser: 2.7 mg/dL — ABNORMAL HIGH (ref 0.61–1.24)
Creatinine, Ser: 3.2 mg/dL — ABNORMAL HIGH (ref 0.61–1.24)
Glucose, Bld: 187 mg/dL — ABNORMAL HIGH (ref 70–99)
Glucose, Bld: 420 mg/dL — ABNORMAL HIGH (ref 70–99)
Glucose, Bld: 202 mg/dL — ABNORMAL HIGH (ref 70–99)
Glucose, Bld: 264 mg/dL — ABNORMAL HIGH (ref 70–99)
HCT: 26 % — ABNORMAL LOW (ref 39.0–52.0)
HCT: 29 % — ABNORMAL LOW (ref 39.0–52.0)
HCT: 28 % — ABNORMAL LOW (ref 39.0–52.0)
HCT: 29 % — ABNORMAL LOW (ref 39.0–52.0)
Hemoglobin: 8.8 g/dL — ABNORMAL LOW (ref 13.0–17.0)
Hemoglobin: 9.9 g/dL — ABNORMAL LOW (ref 13.0–17.0)
Hemoglobin: 9.5 g/dL — ABNORMAL LOW (ref 13.0–17.0)
Hemoglobin: 9.9 g/dL — ABNORMAL LOW (ref 13.0–17.0)
Potassium: 3.5 mmol/L (ref 3.5–5.1)
Potassium: 3.8 mmol/L (ref 3.5–5.1)
Potassium: 3.8 mmol/L (ref 3.5–5.1)
Potassium: 3.8 mmol/L (ref 3.5–5.1)
Sodium: 127 mmol/L — ABNORMAL LOW (ref 135–145)
Sodium: 136 mmol/L (ref 135–145)
Sodium: 131 mmol/L — ABNORMAL LOW (ref 135–145)
Sodium: 136 mmol/L (ref 135–145)
TCO2: 23 mmol/L (ref 22–32)
TCO2: 24 mmol/L (ref 22–32)
TCO2: 25 mmol/L (ref 22–32)
TCO2: 25 mmol/L (ref 22–32)

## 2019-04-16 LAB — RENAL FUNCTION PANEL
Albumin: 2 g/dL — ABNORMAL LOW (ref 3.5–5.0)
Albumin: 2.2 g/dL — ABNORMAL LOW (ref 3.5–5.0)
Anion gap: 15 (ref 5–15)
Anion gap: 13 (ref 5–15)
BUN: 92 mg/dL — ABNORMAL HIGH (ref 8–23)
BUN: 42 mg/dL — ABNORMAL HIGH (ref 8–23)
CO2: 22 mmol/L (ref 22–32)
CO2: 24 mmol/L (ref 22–32)
Calcium: 7.7 mg/dL — ABNORMAL LOW (ref 8.9–10.3)
Calcium: 7.1 mg/dL — ABNORMAL LOW (ref 8.9–10.3)
Chloride: 97 mmol/L — ABNORMAL LOW (ref 98–111)
Chloride: 98 mmol/L (ref 98–111)
Creatinine, Ser: 6.36 mg/dL — ABNORMAL HIGH (ref 0.61–1.24)
Creatinine, Ser: 3.56 mg/dL — ABNORMAL HIGH (ref 0.61–1.24)
GFR calc Af Amer: 8 mL/min — ABNORMAL LOW (ref >60)
GFR calc Af Amer: 17 mL/min — ABNORMAL LOW (ref >60)
GFR calc non Af Amer: 7 mL/min — ABNORMAL LOW (ref >60)
GFR calc non Af Amer: 14 mL/min — ABNORMAL LOW (ref >60)
Glucose, Bld: 79 mg/dL (ref 70–99)
Glucose, Bld: 198 mg/dL — ABNORMAL HIGH (ref 70–99)
Phosphorus: 8.9 mg/dL — ABNORMAL HIGH (ref 2.5–4.6)
Phosphorus: 4.6 mg/dL (ref 2.5–4.6)
Potassium: 4.9 mmol/L (ref 3.5–5.1)
Potassium: 4 mmol/L (ref 3.5–5.1)
Sodium: 134 mmol/L — ABNORMAL LOW (ref 135–145)
Sodium: 135 mmol/L (ref 135–145)

## 2019-04-16 MED ORDER — GLUCERNA SHAKE PO LIQD: 237.0000 mL | Freq: Three times a day (TID) | ORAL | Status: DC

## 2019-04-16 MED ORDER — HEPARIN SODIUM (PORCINE) 5000 UNIT/ML IJ SOLN
5000.0000 [IU] | Freq: Three times a day (TID) | INTRAMUSCULAR | Status: DC
  Administered 2019-04-16 – 2019-04-17 (×4): 5000 [IU] via SUBCUTANEOUS
  Filled 2019-04-16 (×4): qty 1

## 2019-04-16 MED ORDER — THIAMINE HCL 100 MG PO TABS
250.0000 mg | ORAL_TABLET | Freq: Every day | ORAL | Status: AC
  Administered 2019-04-17 – 2019-04-21 (×5): 250 mg
  Filled 2019-04-16 (×5): qty 3

## 2019-04-16 MED ORDER — VITAMIN C 500 MG/5ML PO SYRP
500.0000 mg | ORAL_SOLUTION | Freq: Three times a day (TID) | ORAL | Status: DC
  Filled 2019-04-16 (×3): qty 5

## 2019-04-16 MED ORDER — PRISMASOL BGK 4/2.5 32-4-2.5 MEQ/L REPLACEMENT SOLN: Status: DC

## 2019-04-16 MED ORDER — PRISMASOL BGK 4/2.5 32-4-2.5 MEQ/L IV SOLN: INTRAVENOUS | Status: DC

## 2019-04-16 MED ORDER — PRISMASOL B22GK 4/0 22-4 MEQ/L IV SOLN
INTRAVENOUS | Status: DC
  Filled 2019-04-16 (×74): qty 5000

## 2019-04-16 MED ORDER — ACD FORMULA A 0.73-2.45-2.2 GM/100ML VI SOLN
3000.0000 mL | Status: DC
  Filled 2019-04-16: qty 3000

## 2019-04-16 MED ORDER — DEXTROSE 5 % IV SOLN
20.0000 g | INTRAVENOUS | Status: DC
  Administered 2019-04-16 – 2019-04-22 (×6): 20 g via INTRAVENOUS_CENTRAL
  Filled 2019-04-16 (×17): qty 200

## 2019-04-16 MED ORDER — VITAMIN B-12 1000 MCG PO TABS
1000.0000 ug | ORAL_TABLET | Freq: Every day | ORAL | Status: AC
  Administered 2019-04-16 – 2019-04-20 (×5): 1000 ug
  Filled 2019-04-16 (×5): qty 1

## 2019-04-16 MED ORDER — DEXTROSE 5 % IV SOLN
Status: DC
  Filled 2019-04-16 (×29): qty 1500

## 2019-04-16 MED ORDER — ASCORBIC ACID 500 MG PO TABS
500.0000 mg | ORAL_TABLET | Freq: Three times a day (TID) | ORAL | Status: AC
  Administered 2019-04-16 – 2019-04-21 (×15): 500 mg via ORAL
  Filled 2019-04-16 (×15): qty 1

## 2019-04-16 MED ORDER — THIAMINE HCL 100 MG PO TABS
500.0000 mg | ORAL_TABLET | Freq: Once | ORAL | Status: AC
  Administered 2019-04-16: 500 mg
  Filled 2019-04-16: qty 5

## 2019-04-16 MED ORDER — ALTEPLASE 2 MG IJ SOLR
2.0000 mg | Freq: Once | INTRAMUSCULAR | Status: AC
  Filled 2019-04-16: qty 2

## 2019-04-16 MED ORDER — SODIUM CHLORIDE 0.9 % FOR CRRT
INTRAVENOUS_CENTRAL | Status: DC | PRN
  Filled 2019-04-16 (×2): qty 1000

## 2019-04-16 MED ORDER — NOREPINEPHRINE 16 MG/250ML-% IV SOLN
2.0000 ug/min | INTRAVENOUS | Status: DC
  Administered 2019-04-17 (×2): 13 ug/min via INTRAVENOUS
  Administered 2019-04-18: 12 ug/min via INTRAVENOUS
  Administered 2019-04-20: 7 ug/min via INTRAVENOUS
  Administered 2019-04-23: 6 ug/min via INTRAVENOUS
  Filled 2019-04-16 (×8): qty 250

## 2019-04-16 MED ORDER — ALBUMIN HUMAN 25 % IV SOLN
12.5000 g | Freq: Once | INTRAVENOUS | Status: AC
  Administered 2019-04-16: 12.5 g via INTRAVENOUS
  Filled 2019-04-16: qty 50

## 2019-04-16 MED ORDER — HEPARIN SODIUM (PORCINE) 1000 UNIT/ML IJ SOLN
INTRAMUSCULAR | Status: AC
  Administered 2019-04-16: 3200 [IU] via INTRAVENOUS_CENTRAL
  Filled 2019-04-16: qty 4

## 2019-04-16 MED ORDER — AMIODARONE HCL IN DEXTROSE 360-4.14 MG/200ML-% IV SOLN
60.0000 mg/h | INTRAVENOUS | Status: AC
  Administered 2019-04-16: 60 mg/h via INTRAVENOUS
  Filled 2019-04-16: qty 200

## 2019-04-16 MED ORDER — AMIODARONE IV BOLUS ONLY 150 MG/100ML
150.0000 mg | Freq: Once | INTRAVENOUS | Status: AC
  Administered 2019-04-16: 150 mg via INTRAVENOUS
  Filled 2019-04-16: qty 100

## 2019-04-16 MED ORDER — COSYNTROPIN 0.25 MG IJ SOLR
0.2500 mg | Freq: Once | INTRAMUSCULAR | Status: AC
  Administered 2019-04-17: 0.25 mg via INTRAVENOUS
  Filled 2019-04-16: qty 0.25

## 2019-04-16 MED ORDER — PRISMASOL B22GK 4/0 22-4 MEQ/L REPLACEMENT SOLN
Status: DC
  Filled 2019-04-16 (×14): qty 5000

## 2019-04-16 MED ORDER — AMIODARONE HCL IN DEXTROSE 360-4.14 MG/200ML-% IV SOLN
30.0000 mg/h | INTRAVENOUS | Status: AC
  Administered 2019-04-16 – 2019-04-25 (×18): 30 mg/h via INTRAVENOUS
  Filled 2019-04-16 (×19): qty 200

## 2019-04-16 NOTE — Progress Notes (Addendum)
Nutrition Follow up   DOCUMENTATION CODES:   Not applicable  INTERVENTION:  -Start calorie count -D/C Glucerna- patient would benefit more from Nepro shakes as it provides more kcal and protein (consumed one this am)  Plan [name] tomorrow, start nocturnal feedings:  -Nepro @ 60 ml/hr x 14 hours (6pm-8am) -60 ml Prostat BID  Provides: 1912 kcals, 128 grams protein, 611 ml free water. Meets 87% kcal need and 95% of protein needs. Plan decrease in rate as PO intake progresses.    Nepro Shake po TID, each supplement provides 425 kcal and 19 grams protein  30 ml Prostat BID, each supplement provides 100 kcals and 15 grams protein.   Transition to Renal MVI daily   NUTRITION DIAGNOSIS:   Increased nutrient needs related to post-op healing as evidenced by estimated needs.  Ongoing  GOAL:   Patient will meet greater than or equal to 90% of their needs   Diet just advanced   MONITOR:   Weight trends, Diet advancement, Vent status, Skin, TF tolerance, Labs, I & O's  REASON FOR ASSESSMENT:   Consult Enteral/tube feeding initiation and management  ASSESSMENT:   Patient with PMH significant for CAD s/p PCI, prostate cancer, HTN, CHF, and DM. Presents this admission with chronic a.fib with RVR.   1/28- s/p L heart cath  1/30- s/p CABG x3, MAZE, extubated  2/2- re-intubated  2/5- extubated 2/8- re-intubated  2/10- s/p impella, trach, NGT placed-start trickle feeding 2/11- start CRRT 2/23- removal of impella, [name] dislodged  2/24- CRRT stop  Remains on ATC. Failed iHD this am due to recurrent AF and hypotension. Increased pressor need. May transition back to CVVHD to remove fluid prior to reattempting iHD.   PO intake now decreased. Meal completion charted as 10-50% for his four meals. Start calorie count today. Plan [name] tomorrow with nocturnal feedings.   Admission weight: 89.3 kg  Current weight: 91.7 kg   I/O: +19,402 ml since 2/15 UOP: 5,405 ml x 24 hrs    Drips: levophed Medications: 500 mg TID, aranesp, SS novo,og, lantus, rena vit, renvela, thiamine, B12 Labs: Na 134 (L) Cr 6.36 Phosphorus 8.9 (H) Mg 2.5 (H)   Diet Order:   Diet Order            DIET - DYS 1 Room service appropriate? Yes; Fluid consistency: Nectar Thick  Diet effective now              EDUCATION NEEDS:   Not appropriate for education at this time  Skin:  Skin Assessment: Skin Integrity Issues: Skin Integrity Issues:: Stage I, DTI DTI: chest Stage I: vertebral column Incisions: chest, leg  Last BM:  2/25  Height:   Ht Readings from Last 1 Encounters:  03/26/19 5\' 8"  (1.727 m)    Weight:   Wt Readings from Last 1 Encounters:  04/16/19 91.7 kg    Ideal Body Weight:  70 kg  BMI:  Body mass index is 30.74 kg/m.  Estimated Nutritional Needs:   Kcal:  2200-2400 kcal  Protein:  135-155 grams  Fluid:  >/= 2.2 L/day   Mariana Single RD, LDN Clinical Nutrition Pager # - [phone]

## 2019-04-16 NOTE — Progress Notes (Signed)
  Speech Language Pathology Treatment: Dysphagia;[place] Speaking valve  Patient Details Name: Jesse Murphy MRN: [id number] DOB: 23-Sep-1928 Today's Date: 04/16/2019 Time: 1700-1720 SLP Time Calculation (min) (ACUTE ONLY): 20 min  Assessment / Plan / Recommendation Clinical Impression  Pt was alert and coopertive throughout the session and reported that he has been tolerating the current diet well. Nursing reported one episode of coughing with liquids but stated that the pt did not have any change in vitals at that time. Dentures have been brought to the hospital and were put in place for the session. PMV was on upon SLP's arrival and vocal quality was clear. Vitals were WNL at baseline and throughout the session. He tolerated nectar thick liquids and small boluses (i.e., individual pieces of chopped peaches) of dysphagia 2 solids without overt s/sx of aspiration. He exhibited coughing with 2/5 larger boluses (i.e., two pieces of chopped peaches) of dysphagia 2 solids and with all thin liquids via tsp, suggesting possible aspiration. Mastication time was within functional limits and no significant oral residue was noted. A diet upgrade is not clinically indicated at this time but SLP will continue to follow pt.    HPI HPI: 86 y.o. male with PMH: h/o CAD s/p CABG and clipping of left atrial appendage, who presented with SOB on 2/1. He became hypoxic on 2/2 with pulmonary edema and needed to be intubated on 2/2 and extubated 2/5. Reintubated 2/8.  Pt underwent impella placement and tracheostomy on 2/9. Pt started on CRRT 2/11. ATC trials on 2/17. Impella removal 2/23.      SLP Plan  Continue with current plan of care       Recommendations  Diet recommendations: Dysphagia 1 (puree);Nectar-thick liquid Liquids provided via: Cup;Straw Medication Administration: Crushed with puree Supervision: Full supervision/cueing for compensatory strategies;Staff to assist with self feeding Compensations:  Slow rate;Small sips/bites;Follow solids with liquid;Effortful swallow Postural Changes and/or Swallow Maneuvers: Seated upright 90 degrees      Patient may use Passy-Muir Speech Valve: During all waking hours (remove during sleep);During all therapies with supervision;During PO intake/meals PMSV Supervision: Intermittent MD: Please consider changing trach tube to : Cuffless         Oral Care Recommendations: Oral care BID Follow up Recommendations: Skilled Nursing facility SLP Visit Diagnosis: Dysphagia, oropharyngeal phase (R13.12) Plan: Continue with current plan of care       Genie Mirabal I. [name], [place], [place] Office number [phone] Pager [place] 04/16/2019, 5:37 PM

## 2019-04-16 NOTE — Progress Notes (Signed)
[facility] Progress Note    Assessment/ Plan:   1. AKI(Cr was 0.7 prior to surgery)- due to postoperative CHF/cardiogenic shock that was refractory to diuretics and started on CRRT on 03/29/19 for volume removal.  CRRT off on 2/23 - discussion with advanced CHF.  CRRT restarted 2/24 then off after clotting.  First HD on 2/26 with minimal UF and note increased pressors and rapid afib 1. Seen on HD  Today and Levo @ 81mg. Only 1.5L net UF goal; if he were to remain on iHD he would need daily RRT. If he tolerates HD with goal met will consider challenging again with iHD tomorrow o/w will plan on CRRT to help with UF for 48-72 hrs. Will need citrate regional anticoagulation if CRRT is restarted. 2. Per advanced CHF goal CVP of 5-8 (when was on CRRT) while not having to increase pressors 2. Acute systolic CHF/cardiogenic shock- EF 25-30%; s/p Impella - now out. optimize volume as above.  On midodrine - note increase to 15 mg TID. 3. CAD s/p CABG on 19/14/78complicated by cardiogenic shock and ARF. 4. Acute hypoxic respiratory failure with pseudomonas pneumonia- s/p trach on 03/27/19. s/pabx per PCCM    5. PAF- s/p MAZE and LAA occlusion- therapy per CHF team 6. Hematuria- bladder irrigation per urology recommendations; if possible would be nice to stop the CBI to get a better idea of his UOP. 7. ABLA- Hx Melena. Transfuse as needed. Have given RRBC's on 2/22 and 2/23. And 2/25.  Gave aranesp 2/23 40 mcg and have increased next dose to 60 mcg for 3/2 for now.   8. Protein malnutrition- moderate to severe 9. Secondary hyperparathyroidism/bone mineral -  On renvela.  Check intact PTH.   10. Disposition- Am concerned that he is a poor longterm dialysis candidate given his cardiogenic shock and trach.  Subjective:   Had HD for clearance on 2/26 with 235 mL UF (required inc in pressors and course later complicated by going back into rapid afib and received amio bolus per CTS).  Ins/outs  not accurate due to bladder irrigation (haven't been able to stop due to clotting).  Urology saw 2/27 and rec weaning off of CBI - nursing hasn't tried to wean.  CVP b/w 5-7 overnight and Levo currently at 8 mcg/min.  Still on trach collar.   Noted the patient was given lasix 160 mg IV on 2/28. Difficult to assess true UOP bec of CBI. Weights have gone up in past few days from 86.5kg to 91.6kg.    Objective:   BP (!) 97/58   Pulse (!) 122   Temp 97.8 F (36.6 C) (Oral)   Resp 19   Ht '5\' 8"'  (1.727 m)   Wt 91.6 kg   SpO2 94%   BMI 30.71 kg/m   Intake/Output Summary (Last 24 hours) at 04/16/2019 0934 Last data filed at 04/16/2019 0900 Gross per 24 hour  Intake 4052.43 ml  Output 5815 ml  Net -1762.57 ml   Weight change: 0.5 kg  Physical Exam: Gen: elderly male in bed chronically ill-appearing HEENT trach in place; on vent  CVS: S1 S2 no rub Resp: clear; unlabored at rest; trach collar Abd: soft/ NT/ND  Ext: 2+ edema Neuro - awake and interactive and follows commands, nods Psych - no anxiety or agitation  GU foley in place with irrigation Access: left chest nontunneled dialysis catheter  in lt SCV  Imaging: VAS UK[place]UPPER EXTREMITY VENOUS DUPLEX  Result Date: 04/14/2019 UPPER VENOUS STUDY  Indications: Edema Limitations: Bandages and trach collar. Comparison Study: No prior study on file Performing Technologist: Sharion Dove RVS  Examination Guidelines: A complete evaluation includes B-mode imaging, spectral Doppler, color Doppler, and power Doppler as needed of all accessible portions of each vessel. Bilateral testing is considered an integral part of a complete examination. Limited examinations for reoccurring indications may be performed as noted.  Right Findings: +----------+------------+---------+-----------+----------+-------+ RIGHT     CompressiblePhasicitySpontaneousPropertiesSummary +----------+------------+---------+-----------+----------+-------+ IJV           Full        Yes       Yes                      +----------+------------+---------+-----------+----------+-------+ Subclavian               Yes       Yes                      +----------+------------+---------+-----------+----------+-------+ Axillary      Full       Yes       Yes                      +----------+------------+---------+-----------+----------+-------+ Brachial      Full       Yes       Yes                      +----------+------------+---------+-----------+----------+-------+ Radial        Full                                          +----------+------------+---------+-----------+----------+-------+ Ulnar         Full                                          +----------+------------+---------+-----------+----------+-------+ Cephalic      Full                                          +----------+------------+---------+-----------+----------+-------+ Basilic       Full                                          +----------+------------+---------+-----------+----------+-------+  Left Findings: +----------+------------+---------+-----------+----------+---------------------+ LEFT      CompressiblePhasicitySpontaneousProperties       Summary        +----------+------------+---------+-----------+----------+---------------------+ IJV                      Yes       Yes                                    +----------+------------+---------+-----------+----------+---------------------+ Subclavian                                             Not visualized     +----------+------------+---------+-----------+----------+---------------------+  Axillary      Full       Yes       Yes                                    +----------+------------+---------+-----------+----------+---------------------+ Brachial      Full       Yes       Yes                                     +----------+------------+---------+-----------+----------+---------------------+ Radial        Full                                                        +----------+------------+---------+-----------+----------+---------------------+ Ulnar         Full                                                        +----------+------------+---------+-----------+----------+---------------------+ Cephalic      Full                                                        +----------+------------+---------+-----------+----------+---------------------+ Basilic       None                                   Acute, localized to                                                             [facility] only        +----------+------------+---------+-----------+----------+---------------------+  Summary:  Right: No evidence of deep vein thrombosis in the upper extremity. No evidence of superficial vein thrombosis in the upper extremity.  Left: No evidence of deep vein thrombosis in the upper extremity. Findings consistent with acute superficial vein thrombosis involving the left basilic vein.  *See table(s) above for measurements and observations.  Diagnosing physician: Servando Snare MD Electronically signed by Servando Snare MD on 04/14/2019 at 3:57:34 PM.    Final     Labs: BMET Recent Labs  Lab 04/11/19 1856 04/11/19 3149 04/11/19 1546 04/11/19 1546 04/12/19 7026 04/12/19 1351 04/13/19 0427 04/14/19 0243 04/15/19 0403 04/16/19 0602  NA 135   < > 137  --  137 135 134* 138 136 134*  K 5.7*   < > 5.0  --  5.1 4.7 4.4 4.0 4.4 4.9  CL 104   < > 103  --  101 99 99 102 100 97*  CO2 20*   < > 23  --  22 20* 20* 25 23 22  GLUCOSE 95   < > 168*  --  151* 201* 145* 84 79 79  BUN 54*   < > 38*  --  60* 73* 95* 59* 76* 92*  CREATININE 3.25*   < > 2.43*  --  3.49* 3.99* 4.90* 3.95* 5.34* 6.36*  CALCIUM 7.7*   < > 7.5*   < > 7.6* 7.5* 7.6* 7.7* 7.7*  7.4* 7.7*  PHOS 5.2*  --  3.9  --  6.0*  --  8.0*  5.9* 7.5* 8.9*   < > = values in this interval not displayed.   CBC Recent Labs  Lab 04/13/19 0427 04/14/19 0242 04/15/19 0403 04/16/19 0602  WBC 11.0* 8.1 8.5 9.5  HGB 9.4* 8.4* 8.3* 8.9*  HCT 28.5* 26.3* 26.5* 27.7*  MCV 91.1 93.3 95.0 92.6  PLT 215 163 182 233    Medications:    . sodium chloride   Intravenous Once  . amiodarone  200 mg Oral Daily  . aspirin  81 mg Oral Daily  . atorvastatin  80 mg Oral q1800  . chlorhexidine gluconate (MEDLINE KIT)  15 mL Mouth Rinse BID  . Chlorhexidine Gluconate Cloth  6 each Topical Q0600  . Chlorhexidine Gluconate Cloth  6 each Topical Q0600  . [START ON 04/17/2019] darbepoetin (ARANESP) injection - NON-DIALYSIS  60 mcg Subcutaneous Q Tue-1800  . feeding supplement (GLUCERNA SHAKE)  237 mL Oral TID WC  . feeding supplement (NEPRO CARB STEADY)  237 mL Oral TID BM  . feeding supplement (PRO-STAT SUGAR FREE 64)  30 mL Oral BID  . Gerhardt's butt cream   Topical BID  . heparin      . hydrocortisone   Rectal BID  . insulin aspart  0-24 Units Subcutaneous Q4H  . insulin glargine  26 Units Subcutaneous Daily  . mouth rinse  15 mL Mouth Rinse 10 times per day  . midodrine  15 mg Oral TID WC  . multivitamin  1 tablet Oral QHS  . pantoprazole (PROTONIX) IV  40 mg Intravenous Q24H  . sevelamer carbonate  1,600 mg Oral TID WC  . sodium chloride flush  10-40 mL Intracatheter Q12H      Otelia Santee, MD 04/16/2019, 9:34 AM

## 2019-04-16 NOTE — Progress Notes (Signed)
6 Days Post-Op Procedure(s) (LRB): REMOVAL OF IMPELLA LEFT VENTRICULAR ASSIST DEVICE WITH INSERTION OF RIGHT FEMORAL ARTERIAL LINE (N/A) Subjective: Stable breathing comfortably on trach collar nsr BP 120/ 50 on low dose norepi Tolerating HD started this am  Objective: Vital signs in last 24 hours: Temp:  [97.4 F (36.3 C)-98 F (36.7 C)] 97.8 F (36.6 C) (03/01 0808) Pulse Rate:  [66-109] 80 (03/01 0808) Cardiac Rhythm: Normal sinus rhythm (03/01 0713) Resp:  [13-28] 20 (03/01 0808) BP: (87-125)/(36-82) 100/42 (03/01 0800) SpO2:  [87 %-98 %] 91 % (03/01 0808) FiO2 (%):  [28 %] 28 % (03/01 0730) Weight:  [91.6 kg] 91.6 kg (03/01 0500)  Hemodynamic parameters for last 24 hours: CVP:  [5 mmHg-14 mmHg] 5 mmHg  Intake/Output from previous day: 02/28 0701 - 03/01 0700 In: 4288.7 [P.O.:1080; I.V.:142.6; IV Piggyback:66.1] Out: 5405 [Urine:5405] Intake/Output this shift: Total I/O In: 7.5 [I.V.:7.5] Out: 375 [Urine:375]       Exam    General- alert and comfortable Sternotomy clean, dry    Neck- no JVD, no cervical adenopathy palpable, no carotid bruit   Lungs- clear without rales, wheezes   Cor- regular rate and rhythm, no murmur , gallop   Abdomen- soft, non-tender   Extremities - warm, non-tender, minimal edema   Neuro- oriented, appropriate, no focal weakness   Lab Results: Recent Labs    04/15/19 0403 04/16/19 0602  WBC 8.5 9.5  HGB 8.3* 8.9*  HCT 26.5* 27.7*  PLT 182 233   BMET:  Recent Labs    04/15/19 0403 04/16/19 0602  NA 136 134*  K 4.4 4.9  CL 100 97*  CO2 23 22  GLUCOSE 79 79  BUN 76* 92*  CREATININE 5.34* 6.36*  CALCIUM 7.7*  7.4* 7.7*    PT/INR: No results for input(s): LABPROT, INR in the last 72 hours. ABG    Component Value Date/Time   PHART 7.488 (H) 04/07/2019 0405   HCO3 25.9 04/07/2019 0405   TCO2 27 04/07/2019 0405   ACIDBASEDEF 1.0 04/05/2019 0540   O2SAT 67.8 04/14/2019 1253   CBG (last 3)  Recent Labs     04/16/19 0021 04/16/19 0422 04/16/19 0815  GLUCAP 88 78 74    Assessment/Plan: S/P Procedure(s) (LRB): REMOVAL OF IMPELLA LEFT VENTRICULAR ASSIST DEVICE WITH INSERTION OF RIGHT FEMORAL ARTERIAL LINE (N/A) Concern about adequate nutrition- calorie count Mobilize OOB to chair May need Diatek placed this week- bladder irrigation stopped - follow urine output Hb, WBC ok  LOS: 33 days    Tharon Aquas Trigt III 04/16/2019

## 2019-04-16 NOTE — Progress Notes (Addendum)
PT Cancellation Note  Patient Details Name: Jesse Murphy MRN: [id number] DOB: 1928-08-27   Cancelled Treatment:    Reason Eval/Treat Not Completed: Patient at procedure or test/unavailable pt currently on dialysis. Will follow up as time allows.  Attempted to see in PM, however nurse reports pt is too fatigued/tired to work with therapy. Not even wanting to eat lunch. Will follow.   [name] 04/16/2019, 8:44 AM Marisa Severin, PT, DPT Acute Rehabilitation Services Pager [phone] Office [phone]

## 2019-04-16 NOTE — Progress Notes (Addendum)
MAP trends now in the 50s even with current max dose of levophed. Also remains in A-fib with rate in the 110s-120s.  PICC RN at bedside to evaluate occluded port. Recommends cathflo to reestablish patency. Dr. Lynetta Mare notified. New orders received.

## 2019-04-16 NOTE — Progress Notes (Signed)
[facility] BRIEF SICU PROGRESS NOTE  6 Days Post-Op  S/P Procedure(s) (LRB): REMOVAL OF IMPELLA LEFT VENTRICULAR ASSIST DEVICE WITH INSERTION OF RIGHT FEMORAL ARTERIAL LINE (N/A)   Stable day Afib w/ HR 90 on IV amiodarone BP improved on levophed @ 14 Breathing comfortably on trach collar  Plan: Continue current plan  Rexene Alberts, MD 04/16/2019 5:33 PM

## 2019-04-16 NOTE — Progress Notes (Signed)
Patient ID: MICKAEL MCNUTT, male   DOB: 29-Sep-1928, 86 y.o.   MRN: [id number] f    Advanced Heart Failure Rounding Note  PCP-Cardiologist: Kate Sable, MD   Subjective:    Impella removed 2/23. Intraoperative TEE showed EF ~40%  Remains on TC.   Started on iHD. Now back in AF with RVR. SBP dropping in 80-90s.   Feels ok. Hands swollen. Denies SOB, orthopnea. Feels fatigued.   Got lasix 160 IV yesterday with 5 metolazone. Unclear how much urine out due to CBI being restarted.   Hematuria has cleared.   On NE 8 mcg overnight now up to 12+ midodrine 15 tid.   Objective:   Weight Range: 91.6 kg Body mass index is 30.71 kg/m.   Vital Signs:   Temp:  [97.4 F (36.3 C)-98 F (36.7 C)] 97.8 F (36.6 C) (03/01 0808) Pulse Rate:  [66-122] 119 (03/01 0948) Resp:  [13-28] 24 (03/01 0948) BP: (84-129)/(36-82) 84/40 (03/01 0948) SpO2:  [87 %-98 %] 93 % (03/01 0948) FiO2 (%):  [28 %] 28 % (03/01 0730) Weight:  [91.6 kg] 91.6 kg (03/01 0500) Last BM Date: 04/12/19  Weight change: Filed Weights   04/14/19 0500 04/15/19 0500 04/16/19 0500  Weight: 88.4 kg 91.1 kg 91.6 kg    Intake/Output:   Intake/Output Summary (Last 24 hours) at 04/16/2019 1012 Last data filed at 04/16/2019 0900 Gross per 24 hour  Intake 3686.81 ml  Output 5815 ml  Net -2128.19 ml      Physical Exam   General:  Sitting up in bed. No resp difficulty HEENT: normal Neck: supple. + trach collar Carotids 2+ bilat; no bruits. No lymphadenopathy or thryomegaly appreciated. Cor: PMI nondisplaced. Irregular rate & rhythm. No rubs, gallops or murmurs. Lungs: clear  Abdomen: soft, nontender, nondistended. No hepatosplenomegaly. No bruits or masses. Good bowel sounds. Extremities: no cyanosis, clubbing, rash, 2+ edema. 3+ UE edema Neuro: awake. Alert. Following commands  Telemetry   AF 110-120s Personally reviewed  Labs    CBC Recent Labs    04/15/19 0403 04/16/19 0602  WBC 8.5 9.5  HGB 8.3* 8.9*    HCT 26.5* 27.7*  MCV 95.0 92.6  PLT 182 465   Basic Metabolic Panel Recent Labs    04/15/19 0403 04/16/19 0602  NA 136 134*  K 4.4 4.9  CL 100 97*  CO2 23 22  GLUCOSE 79 79  BUN 76* 92*  CREATININE 5.34* 6.36*  CALCIUM 7.7*  7.4* 7.7*  MG 2.3 2.5*  PHOS 7.5* 8.9*   Liver Function Tests Recent Labs    04/15/19 0403 04/16/19 0602  ALBUMIN 2.1* 2.0*   No results for input(s): LIPASE, AMYLASE in the last 72 hours. Cardiac Enzymes No results for input(s): CKTOTAL, CKMB, CKMBINDEX, TROPONINI in the last 72 hours.  BNP: BNP (last 3 results) Recent Labs    03/22/19 0401 03/26/19 0626 03/27/19 1255  BNP 340.4* 687.5* 800.4*    ProBNP (last 3 results) No results for input(s): PROBNP in the last 8760 hours.   D-Dimer No results for input(s): DDIMER in the last 72 hours. Hemoglobin A1C No results for input(s): HGBA1C in the last 72 hours. Fasting Lipid Panel No results for input(s): CHOL, HDL, LDLCALC, TRIG, CHOLHDL, LDLDIRECT in the last 72 hours. Thyroid Function Tests No results for input(s): TSH, T4TOTAL, T3FREE, THYROIDAB in the last 72 hours.  Invalid input(s): FREET3  Other results:   Imaging    No results found.   Medications:  Scheduled Medications: . sodium chloride   Intravenous Once  . amiodarone  200 mg Oral Daily  . aspirin  81 mg Oral Daily  . atorvastatin  80 mg Oral q1800  . chlorhexidine gluconate (MEDLINE KIT)  15 mL Mouth Rinse BID  . Chlorhexidine Gluconate Cloth  6 each Topical Q0600  . Chlorhexidine Gluconate Cloth  6 each Topical Q0600  . [START ON 04/17/2019] darbepoetin (ARANESP) injection - NON-DIALYSIS  60 mcg Subcutaneous Q Tue-1800  . feeding supplement (GLUCERNA SHAKE)  237 mL Oral TID WC  . feeding supplement (NEPRO CARB STEADY)  237 mL Oral TID BM  . feeding supplement (PRO-STAT SUGAR FREE 64)  30 mL Oral BID  . Gerhardt's butt cream   Topical BID  . heparin      . heparin injection (subcutaneous)  5,000 Units  Subcutaneous Q8H  . hydrocortisone   Rectal BID  . insulin aspart  0-24 Units Subcutaneous Q4H  . insulin glargine  26 Units Subcutaneous Daily  . mouth rinse  15 mL Mouth Rinse 10 times per day  . midodrine  15 mg Oral TID WC  . multivitamin  1 tablet Oral QHS  . pantoprazole (PROTONIX) IV  40 mg Intravenous Q24H  . sevelamer carbonate  1,600 mg Oral TID WC  . sodium chloride flush  10-40 mL Intracatheter Q12H    Infusions: . sodium chloride    . sodium chloride    . sodium chloride    . amiodarone 150 mg (04/16/19 1007)  . norepinephrine (LEVOPHED) Adult infusion 8 mcg/min (04/16/19 0900)    PRN Medications: sodium chloride, sodium chloride, sodium chloride, oxyCODONE **AND** acetaminophen, alteplase, dextrose, fentaNYL (SUBLIMAZE) injection, heparin, hyoscyamine, lidocaine (PF), lidocaine-prilocaine, ondansetron (ZOFRAN) IV, pentafluoroprop-tetrafluoroeth, Resource ThickenUp Clear, sodium chloride flush, sodium chloride flush    Assessment/Plan   1. Acute systolic HF -> Cardiogenic shock - post-op echo on 03/20/19 EF 25-30% (pre-op 30-35%. In 12/2017 EF normal) - Required Impella support post CABG. Impella removed 2/23 - CVP 8-10 but has significant peripheral edema.  - Remains on NE 8. Now up to 12 on iHD. On midodrine 15 tid.  - Failing iHD today due to recurrent AF and hypotension  - Wean NE as toelrated - No HD planned today. Received lasix yesterday. Minimal response. Will repeat lasix today. Add metolazone 5.   2. CAD s/p CABG this admit (LIMA->LAD, SVG->OM, SVG->RCA on 03/15/19) - No s/s angina - Continue ASA +statin  3. Acute hypoxic respiratory failure with Pseudomonas PNA - s/p trach on 03/27/19 - on TC with PMV today. Doing well - completed meropenem for pseudomonas PNA  4. PAF - s/p MAZE and LAA occlusion - continues to go in/out of AF - will bolus with IV amio. Continue po amio.  - Off systemic heparin with hematuria and melena/ anemia.  - Will start DVT  dose heparin and see how he tolerate  5. AKI  - due to shock/ATN - Started [facility] 03/29/19 per Nephrology.  - Failing iHD today due to recurrent AF and hypotension  - Received test doses of lasix over the weekend without much response - Discussed with Renal about possible need to switch to CVVHD to get some fluid off prior to reattempting iHD   6. Hematuria  -Off heparin drip.  - Urology following. CBI stopped 2/27. Restarted overnight - Seems improved today. Can stop  7. Melena - No melena overnight.   8. Anemia -Hematuria/melena. -Hgb 8.4 -> 8.2 -> 8.9 today  9. Debility, severe -  continue PT - repeat OOB and in chair today  10. Hyperkalemia - K 4.9 today. Getting HD  11. UE swelling - u/s negative for DVT. Wrap/elevate  CRITICAL CARE Performed by: Glori Bickers  Total critical care time: 45 minutes  Critical care time was exclusive of separately billable procedures and treating other patients.  Critical care was necessary to treat or prevent imminent or life-threatening deterioration.  Critical care was time spent personally by me (independent of midlevel providers or residents) on the following activities: development of treatment plan with patient and/or surrogate as well as nursing, discussions with consultants, evaluation of patient's response to treatment, examination of patient, obtaining history from patient or surrogate, ordering and performing treatments and interventions, ordering and review of laboratory studies, ordering and review of radiographic studies, pulse oximetry and re-evaluation of patient's condition.    Glori Bickers, MD  10:12 AM

## 2019-04-16 NOTE — Progress Notes (Signed)
NAME:  Jesse Murphy, MRN:  [id number], DOB:  07/24/1928, LOS: 35 ADMISSION DATE:  03/13/2019, CONSULTATION DATE:  2/2 REFERRING MD:  Nils Pyle, CHIEF COMPLAINT:  Dyspnea   Brief History   86 y/o male admitted 1/26, underwent CABG and L atrial appendage clipping on 1/29, moved to ICU on 2/1 for intubation.  Past Medical History  A fib, CAD, Prostate cancer, HTN  Significant Hospital Events   1/29 CABG 2/01 to ICU 2/05 Extubated 2/08 Reintubated 2/09 tracheostomy 2/09 Impella, PA catheter placed 2/11 CRRT initiated 2/16 leukocytosis, add vancomycin 2/17 start TC trials 2/19 urology placed 3 way foley, bleeding at rectal tube site 2/21 ATC initiated  2/22 1 unit PRBC 2/23 1 unit PRBC. Impella removed 2/25 ATC trach collar.  Consults:  Nephrology CHF team Urology  Procedures:  Rt PICC 2/02 >> Trach 2/09 >> PA catheter 2/09 >> Lt [place] HD cath 2/11 >> L Rad Aline 2/3 >>  Impella 2/10 >> 2/23  Significant Diagnostic Tests:  Echo 1/26 >> EF 30 to 35% Echo 2/09 >> EF 50-55%   Micro Data:  SARS CoV2 PCR 1/26 >> negative Influenza PCR 1/26 >> negative Sputum 2/01 >> oral flora Sputum 2/08 >> Pseudomonas >> pan sensitive  Blood 2/16 >> negative Sputum 2/16 >> Pseudomonas sens to cipro, gent, imepenem Resp 2/20>> PA sens cipro, gent, impipenem BCx2 2/20 >> neg  Antimicrobials:  Rocephin 2/01 >> 2/07 Cefepime 2/08 >> 2/10 Fortaz 2/10 >> 2/16 Vancomycin 2/16 >> 2/19 Meropenem 2/20 >> 2/26  Interim history/subjective:  Tolerating trach collar. Tolerates PMV.  Keeps having hypotension requiring NE and AF during HD. Patient is asymptomatic.  Objective   Blood pressure (!) 84/40, pulse (!) 119, temperature 97.8 F (36.6 C), temperature source Oral, resp. rate (!) 24, height 5\' 8"  (1.727 m), weight 91.6 kg, SpO2 93 %. CVP:  [5 mmHg-14 mmHg] 5 mmHg  FiO2 (%):  [28 %] 28 %   Intake/Output Summary (Last 24 hours) at 04/16/2019 1024 Last data filed at 04/16/2019  0900 Gross per 24 hour  Intake 3686.81 ml  Output 5815 ml  Net -2128.19 ml   Filed Weights   04/14/19 0500 04/15/19 0500 04/16/19 0500  Weight: 88.4 kg 91.1 kg 91.6 kg   Physical Exam: General: Elderly-appearing, no acute distress HENT: [place], AT, OP clear, MMM Neck: Cuffed trach in place, c/d/i Eyes: EOMI, no scleral icterus Respiratory: Clear to auscultation bilaterally.  No crackles, wheezing or rales Cardiovascular: RRR, -M/R/G, no JVD GI: BS+, soft, nontender Extremities: 2+ pitting edema x4 ,-tenderness Neuro: AAO x4, CNII-XII grossly intact GU: Foley in place with clear urine  Resolved Hospital Problem list   Pseudomonas HCAP s/p completion meropenem  Assessment & Plan:   Acute Hypoxemic Respiratory Failure due to acute pulmonary edema, pneumonia. Small Left Pleural Post-pericardiotomy Effusion  S/p tracheostomy 2/9 Off vent >72 hours. -Trach collar as tolerated -Routine trach care -Speech following. Tolerating PMV for talking and eating -Will hold on changing trach to cuffless until acute critical illness has resolved  Cardiogenic Shock with acute systolic heart failure s/p Impella removal 2/23. Now off inotropes. EF normal per last echo. Atrial fibrillation. Recurrent need for vasopressors is limiting patient's progress and is not a long-term outpatient option.  Persistent impaired vasoreactivity. -Vasopressor and midodrine management to keep MAP>65 -ACTH stimulation test to rule out relative adrenal insufficiency.  -Trial of vitamin C, B12 and thiamine supplementation.    Persistent Hematuria with retained clots - hematuria clearing -Appreciate Urology input. Weaning  off CBI  AKI secondary to Cardiogenic Shock. CRRT clotted off 2/24. - HD per Nephrology. Next planned 3/1  Anemia of critical illness. Some flexiseal trauma and hematuria as above -trend CBC  -transfuse for Hgb <7% or significant bleeding  -Holding heparin gtt  DM2 with hyperglycemia, poorly  controlled -SSI  -lantus 26 units QD -SSI  Pressure wounds / Reported as Skin Tear 2/15 -WOC following, appreciate input    Best practice:  Diet: Dysphagia 1 diet DVT prophylaxis: SCD's GI prophylaxis: PPI Mobility: bed rest Code Status: Partial Code  Disposition: ICU  Labs    CMP Latest Ref Rng & Units 04/16/2019 04/15/2019 04/15/2019  Glucose 70 - 99 mg/dL 79 79 -  BUN 8 - 23 mg/dL 92(H) 76(H) -  Creatinine 0.61 - 1.24 mg/dL 6.36(H) 5.34(H) -  Sodium 135 - 145 mmol/L 134(L) 136 -  Potassium 3.5 - 5.1 mmol/L 4.9 4.4 -  Chloride 98 - 111 mmol/L 97(L) 100 -  CO2 22 - 32 mmol/L 22 23 -  Calcium 8.9 - 10.3 mg/dL 7.7(L) 7.7(L) 7.4(L)  Total Protein 6.5 - 8.1 g/dL - - -  Total Bilirubin 0.3 - 1.2 mg/dL - - -  Alkaline Phos 38 - 126 U/L - - -  AST 15 - 41 U/L - - -  ALT 0 - 44 U/L - - -    CBC Latest Ref Rng & Units 04/16/2019 04/15/2019 04/14/2019  WBC 4.0 - 10.5 K/uL 9.5 8.5 8.1  Hemoglobin 13.0 - 17.0 g/dL 8.9(L) 8.3(L) 8.4(L)  Hematocrit 39.0 - 52.0 % 27.7(L) 26.5(L) 26.3(L)  Platelets 150 - 400 K/uL 233 182 163    ABG    Component Value Date/Time   PHART 7.488 (H) 04/07/2019 0405   PCO2ART 34.4 04/07/2019 0405   PO2ART 101.0 04/07/2019 0405   HCO3 25.9 04/07/2019 0405   TCO2 27 04/07/2019 0405   ACIDBASEDEF 1.0 04/05/2019 0540   O2SAT 67.8 04/14/2019 1253    CBG (last 3)  Recent Labs    04/16/19 0021 04/16/19 0422 04/16/19 0815  GLUCAP 88 78 [place], MD [facility] ICU Physician [place]  Pager: [phone] Mobile: [phone] After hours: [phone].  04/16/2019, 10:44 AM

## 2019-04-17 ENCOUNTER — Inpatient Hospital Stay (HOSPITAL_COMMUNITY): Payer: Medicare Other

## 2019-04-17 LAB — RENAL FUNCTION PANEL
Albumin: 2 g/dL — ABNORMAL LOW (ref 3.5–5.0)
Albumin: 2.1 g/dL — ABNORMAL LOW (ref 3.5–5.0)
Anion gap: 12 (ref 5–15)
Anion gap: 13 (ref 5–15)
BUN: 28 mg/dL — ABNORMAL HIGH (ref 8–23)
BUN: 22 mg/dL (ref 8–23)
CO2: 27 mmol/L (ref 22–32)
CO2: 29 mmol/L (ref 22–32)
Calcium: 10.8 mg/dL — ABNORMAL HIGH (ref 8.9–10.3)
Calcium: 7.2 mg/dL — ABNORMAL LOW (ref 8.9–10.3)
Chloride: 93 mmol/L — ABNORMAL LOW (ref 98–111)
Chloride: 95 mmol/L — ABNORMAL LOW (ref 98–111)
Creatinine, Ser: 2.51 mg/dL — ABNORMAL HIGH (ref 0.61–1.24)
Creatinine, Ser: 2.02 mg/dL — ABNORMAL HIGH (ref 0.61–1.24)
GFR calc Af Amer: 25 mL/min — ABNORMAL LOW (ref >60)
GFR calc Af Amer: 33 mL/min — ABNORMAL LOW (ref >60)
GFR calc non Af Amer: 22 mL/min — ABNORMAL LOW (ref >60)
GFR calc non Af Amer: 29 mL/min — ABNORMAL LOW (ref >60)
Glucose, Bld: 271 mg/dL — ABNORMAL HIGH (ref 70–99)
Glucose, Bld: 135 mg/dL — ABNORMAL HIGH (ref 70–99)
Phosphorus: 2.9 mg/dL (ref 2.5–4.6)
Phosphorus: 2 mg/dL — ABNORMAL LOW (ref 2.5–4.6)
Potassium: 3.6 mmol/L (ref 3.5–5.1)
Potassium: 3.9 mmol/L (ref 3.5–5.1)
Sodium: 132 mmol/L — ABNORMAL LOW (ref 135–145)
Sodium: 137 mmol/L (ref 135–145)

## 2019-04-17 LAB — POCT I-STAT, CHEM 8
BUN: 18 mg/dL (ref 8–23)
BUN: 18 mg/dL (ref 8–23)
BUN: 19 mg/dL (ref 8–23)
BUN: 20 mg/dL (ref 8–23)
BUN: 20 mg/dL (ref 8–23)
BUN: 21 mg/dL (ref 8–23)
BUN: 21 mg/dL (ref 8–23)
BUN: 21 mg/dL (ref 8–23)
BUN: 23 mg/dL (ref 8–23)
BUN: 23 mg/dL (ref 8–23)
BUN: 24 mg/dL — ABNORMAL HIGH (ref 8–23)
BUN: 24 mg/dL — ABNORMAL HIGH (ref 8–23)
BUN: 25 mg/dL — ABNORMAL HIGH (ref 8–23)
BUN: 25 mg/dL — ABNORMAL HIGH (ref 8–23)
BUN: 26 mg/dL — ABNORMAL HIGH (ref 8–23)
BUN: 27 mg/dL — ABNORMAL HIGH (ref 8–23)
BUN: 28 mg/dL — ABNORMAL HIGH (ref 8–23)
BUN: 32 mg/dL — ABNORMAL HIGH (ref 8–23)
BUN: 34 mg/dL — ABNORMAL HIGH (ref 8–23)
Calcium, Ion: 0.36 mmol/L — CL (ref 1.15–1.40)
Calcium, Ion: 0.39 mmol/L — CL (ref 1.15–1.40)
Calcium, Ion: 0.39 mmol/L — CL (ref 1.15–1.40)
Calcium, Ion: 0.39 mmol/L — CL (ref 1.15–1.40)
Calcium, Ion: 0.43 mmol/L — CL (ref 1.15–1.40)
Calcium, Ion: 0.44 mmol/L — CL (ref 1.15–1.40)
Calcium, Ion: 0.44 mmol/L — CL (ref 1.15–1.40)
Calcium, Ion: 0.46 mmol/L — CL (ref 1.15–1.40)
Calcium, Ion: 0.51 mmol/L — CL (ref 1.15–1.40)
Calcium, Ion: 0.53 mmol/L — CL (ref 1.15–1.40)
Calcium, Ion: 0.73 mmol/L — CL (ref 1.15–1.40)
Calcium, Ion: 0.74 mmol/L — CL (ref 1.15–1.40)
Calcium, Ion: 0.75 mmol/L — CL (ref 1.15–1.40)
Calcium, Ion: 0.84 mmol/L — CL (ref 1.15–1.40)
Calcium, Ion: 0.88 mmol/L — CL (ref 1.15–1.40)
Calcium, Ion: 1.15 mmol/L (ref 1.15–1.40)
Calcium, Ion: 1.16 mmol/L (ref 1.15–1.40)
Calcium, Ion: 1.21 mmol/L (ref 1.15–1.40)
Calcium, Ion: 1.24 mmol/L (ref 1.15–1.40)
Chloride: 90 mmol/L — ABNORMAL LOW (ref 98–111)
Chloride: 91 mmol/L — ABNORMAL LOW (ref 98–111)
Chloride: 91 mmol/L — ABNORMAL LOW (ref 98–111)
Chloride: 91 mmol/L — ABNORMAL LOW (ref 98–111)
Chloride: 92 mmol/L — ABNORMAL LOW (ref 98–111)
Chloride: 92 mmol/L — ABNORMAL LOW (ref 98–111)
Chloride: 92 mmol/L — ABNORMAL LOW (ref 98–111)
Chloride: 92 mmol/L — ABNORMAL LOW (ref 98–111)
Chloride: 92 mmol/L — ABNORMAL LOW (ref 98–111)
Chloride: 92 mmol/L — ABNORMAL LOW (ref 98–111)
Chloride: 92 mmol/L — ABNORMAL LOW (ref 98–111)
Chloride: 92 mmol/L — ABNORMAL LOW (ref 98–111)
Chloride: 92 mmol/L — ABNORMAL LOW (ref 98–111)
Chloride: 93 mmol/L — ABNORMAL LOW (ref 98–111)
Chloride: 94 mmol/L — ABNORMAL LOW (ref 98–111)
Chloride: 94 mmol/L — ABNORMAL LOW (ref 98–111)
Chloride: 94 mmol/L — ABNORMAL LOW (ref 98–111)
Chloride: 94 mmol/L — ABNORMAL LOW (ref 98–111)
Chloride: 95 mmol/L — ABNORMAL LOW (ref 98–111)
Creatinine, Ser: 1.6 mg/dL — ABNORMAL HIGH (ref 0.61–1.24)
Creatinine, Ser: 1.7 mg/dL — ABNORMAL HIGH (ref 0.61–1.24)
Creatinine, Ser: 1.7 mg/dL — ABNORMAL HIGH (ref 0.61–1.24)
Creatinine, Ser: 1.8 mg/dL — ABNORMAL HIGH (ref 0.61–1.24)
Creatinine, Ser: 1.8 mg/dL — ABNORMAL HIGH (ref 0.61–1.24)
Creatinine, Ser: 1.9 mg/dL — ABNORMAL HIGH (ref 0.61–1.24)
Creatinine, Ser: 2 mg/dL — ABNORMAL HIGH (ref 0.61–1.24)
Creatinine, Ser: 2 mg/dL — ABNORMAL HIGH (ref 0.61–1.24)
Creatinine, Ser: 2 mg/dL — ABNORMAL HIGH (ref 0.61–1.24)
Creatinine, Ser: 2.1 mg/dL — ABNORMAL HIGH (ref 0.61–1.24)
Creatinine, Ser: 2.3 mg/dL — ABNORMAL HIGH (ref 0.61–1.24)
Creatinine, Ser: 2.3 mg/dL — ABNORMAL HIGH (ref 0.61–1.24)
Creatinine, Ser: 2.4 mg/dL — ABNORMAL HIGH (ref 0.61–1.24)
Creatinine, Ser: 2.5 mg/dL — ABNORMAL HIGH (ref 0.61–1.24)
Creatinine, Ser: 2.5 mg/dL — ABNORMAL HIGH (ref 0.61–1.24)
Creatinine, Ser: 2.6 mg/dL — ABNORMAL HIGH (ref 0.61–1.24)
Creatinine, Ser: 2.8 mg/dL — ABNORMAL HIGH (ref 0.61–1.24)
Creatinine, Ser: 3 mg/dL — ABNORMAL HIGH (ref 0.61–1.24)
Creatinine, Ser: 3.1 mg/dL — ABNORMAL HIGH (ref 0.61–1.24)
Glucose, Bld: 131 mg/dL — ABNORMAL HIGH (ref 70–99)
Glucose, Bld: 132 mg/dL — ABNORMAL HIGH (ref 70–99)
Glucose, Bld: 155 mg/dL — ABNORMAL HIGH (ref 70–99)
Glucose, Bld: 161 mg/dL — ABNORMAL HIGH (ref 70–99)
Glucose, Bld: 161 mg/dL — ABNORMAL HIGH (ref 70–99)
Glucose, Bld: 164 mg/dL — ABNORMAL HIGH (ref 70–99)
Glucose, Bld: 165 mg/dL — ABNORMAL HIGH (ref 70–99)
Glucose, Bld: 166 mg/dL — ABNORMAL HIGH (ref 70–99)
Glucose, Bld: 169 mg/dL — ABNORMAL HIGH (ref 70–99)
Glucose, Bld: 175 mg/dL — ABNORMAL HIGH (ref 70–99)
Glucose, Bld: 178 mg/dL — ABNORMAL HIGH (ref 70–99)
Glucose, Bld: 182 mg/dL — ABNORMAL HIGH (ref 70–99)
Glucose, Bld: 191 mg/dL — ABNORMAL HIGH (ref 70–99)
Glucose, Bld: 192 mg/dL — ABNORMAL HIGH (ref 70–99)
Glucose, Bld: 196 mg/dL — ABNORMAL HIGH (ref 70–99)
Glucose, Bld: 199 mg/dL — ABNORMAL HIGH (ref 70–99)
Glucose, Bld: 202 mg/dL — ABNORMAL HIGH (ref 70–99)
Glucose, Bld: 210 mg/dL — ABNORMAL HIGH (ref 70–99)
Glucose, Bld: 221 mg/dL — ABNORMAL HIGH (ref 70–99)
HCT: 24 % — ABNORMAL LOW (ref 39.0–52.0)
HCT: 25 % — ABNORMAL LOW (ref 39.0–52.0)
HCT: 25 % — ABNORMAL LOW (ref 39.0–52.0)
HCT: 26 % — ABNORMAL LOW (ref 39.0–52.0)
HCT: 26 % — ABNORMAL LOW (ref 39.0–52.0)
HCT: 26 % — ABNORMAL LOW (ref 39.0–52.0)
HCT: 27 % — ABNORMAL LOW (ref 39.0–52.0)
HCT: 27 % — ABNORMAL LOW (ref 39.0–52.0)
HCT: 27 % — ABNORMAL LOW (ref 39.0–52.0)
HCT: 27 % — ABNORMAL LOW (ref 39.0–52.0)
HCT: 28 % — ABNORMAL LOW (ref 39.0–52.0)
HCT: 28 % — ABNORMAL LOW (ref 39.0–52.0)
HCT: 29 % — ABNORMAL LOW (ref 39.0–52.0)
HCT: 29 % — ABNORMAL LOW (ref 39.0–52.0)
HCT: 29 % — ABNORMAL LOW (ref 39.0–52.0)
HCT: 29 % — ABNORMAL LOW (ref 39.0–52.0)
HCT: 30 % — ABNORMAL LOW (ref 39.0–52.0)
HCT: 31 % — ABNORMAL LOW (ref 39.0–52.0)
HCT: 32 % — ABNORMAL LOW (ref 39.0–52.0)
Hemoglobin: 10.2 g/dL — ABNORMAL LOW (ref 13.0–17.0)
Hemoglobin: 10.5 g/dL — ABNORMAL LOW (ref 13.0–17.0)
Hemoglobin: 10.9 g/dL — ABNORMAL LOW (ref 13.0–17.0)
Hemoglobin: 8.2 g/dL — ABNORMAL LOW (ref 13.0–17.0)
Hemoglobin: 8.5 g/dL — ABNORMAL LOW (ref 13.0–17.0)
Hemoglobin: 8.5 g/dL — ABNORMAL LOW (ref 13.0–17.0)
Hemoglobin: 8.8 g/dL — ABNORMAL LOW (ref 13.0–17.0)
Hemoglobin: 8.8 g/dL — ABNORMAL LOW (ref 13.0–17.0)
Hemoglobin: 8.8 g/dL — ABNORMAL LOW (ref 13.0–17.0)
Hemoglobin: 9.2 g/dL — ABNORMAL LOW (ref 13.0–17.0)
Hemoglobin: 9.2 g/dL — ABNORMAL LOW (ref 13.0–17.0)
Hemoglobin: 9.2 g/dL — ABNORMAL LOW (ref 13.0–17.0)
Hemoglobin: 9.2 g/dL — ABNORMAL LOW (ref 13.0–17.0)
Hemoglobin: 9.5 g/dL — ABNORMAL LOW (ref 13.0–17.0)
Hemoglobin: 9.5 g/dL — ABNORMAL LOW (ref 13.0–17.0)
Hemoglobin: 9.9 g/dL — ABNORMAL LOW (ref 13.0–17.0)
Hemoglobin: 9.9 g/dL — ABNORMAL LOW (ref 13.0–17.0)
Hemoglobin: 9.9 g/dL — ABNORMAL LOW (ref 13.0–17.0)
Hemoglobin: 9.9 g/dL — ABNORMAL LOW (ref 13.0–17.0)
Potassium: 3.7 mmol/L (ref 3.5–5.1)
Potassium: 3.7 mmol/L (ref 3.5–5.1)
Potassium: 3.7 mmol/L (ref 3.5–5.1)
Potassium: 3.7 mmol/L (ref 3.5–5.1)
Potassium: 3.8 mmol/L (ref 3.5–5.1)
Potassium: 3.8 mmol/L (ref 3.5–5.1)
Potassium: 3.8 mmol/L (ref 3.5–5.1)
Potassium: 3.8 mmol/L (ref 3.5–5.1)
Potassium: 3.8 mmol/L (ref 3.5–5.1)
Potassium: 3.8 mmol/L (ref 3.5–5.1)
Potassium: 3.8 mmol/L (ref 3.5–5.1)
Potassium: 3.8 mmol/L (ref 3.5–5.1)
Potassium: 3.8 mmol/L (ref 3.5–5.1)
Potassium: 3.8 mmol/L (ref 3.5–5.1)
Potassium: 3.9 mmol/L (ref 3.5–5.1)
Potassium: 3.9 mmol/L (ref 3.5–5.1)
Potassium: 4 mmol/L (ref 3.5–5.1)
Potassium: 4 mmol/L (ref 3.5–5.1)
Potassium: 4 mmol/L (ref 3.5–5.1)
Sodium: 133 mmol/L — ABNORMAL LOW (ref 135–145)
Sodium: 133 mmol/L — ABNORMAL LOW (ref 135–145)
Sodium: 134 mmol/L — ABNORMAL LOW (ref 135–145)
Sodium: 134 mmol/L — ABNORMAL LOW (ref 135–145)
Sodium: 136 mmol/L (ref 135–145)
Sodium: 137 mmol/L (ref 135–145)
Sodium: 137 mmol/L (ref 135–145)
Sodium: 137 mmol/L (ref 135–145)
Sodium: 137 mmol/L (ref 135–145)
Sodium: 137 mmol/L (ref 135–145)
Sodium: 137 mmol/L (ref 135–145)
Sodium: 137 mmol/L (ref 135–145)
Sodium: 138 mmol/L (ref 135–145)
Sodium: 138 mmol/L (ref 135–145)
Sodium: 138 mmol/L (ref 135–145)
Sodium: 138 mmol/L (ref 135–145)
Sodium: 138 mmol/L (ref 135–145)
Sodium: 138 mmol/L (ref 135–145)
Sodium: 138 mmol/L (ref 135–145)
TCO2: 26 mmol/L (ref 22–32)
TCO2: 26 mmol/L (ref 22–32)
TCO2: 27 mmol/L (ref 22–32)
TCO2: 28 mmol/L (ref 22–32)
TCO2: 28 mmol/L (ref 22–32)
TCO2: 28 mmol/L (ref 22–32)
TCO2: 28 mmol/L (ref 22–32)
TCO2: 28 mmol/L (ref 22–32)
TCO2: 29 mmol/L (ref 22–32)
TCO2: 29 mmol/L (ref 22–32)
TCO2: 29 mmol/L (ref 22–32)
TCO2: 30 mmol/L (ref 22–32)
TCO2: 30 mmol/L (ref 22–32)
TCO2: 30 mmol/L (ref 22–32)
TCO2: 30 mmol/L (ref 22–32)
TCO2: 31 mmol/L (ref 22–32)
TCO2: 31 mmol/L (ref 22–32)
TCO2: 32 mmol/L (ref 22–32)
TCO2: 32 mmol/L (ref 22–32)

## 2019-04-17 LAB — MAGNESIUM: Magnesium: 1.7 mg/dL (ref 1.7–2.4)

## 2019-04-17 LAB — CBC
HCT: 26.1 % — ABNORMAL LOW (ref 39.0–52.0)
Hemoglobin: 8.4 g/dL — ABNORMAL LOW (ref 13.0–17.0)
MCH: 29.7 pg (ref 26.0–34.0)
MCHC: 32.2 g/dL (ref 30.0–36.0)
MCV: 92.2 fL (ref 80.0–100.0)
Platelets: 224 10*3/uL (ref 150–400)
RBC: 2.83 MIL/uL — ABNORMAL LOW (ref 4.22–5.81)
RDW: 15.4 % (ref 11.5–15.5)
WBC: 9.1 10*3/uL (ref 4.0–10.5)
nRBC: 0 % (ref 0.0–0.2)

## 2019-04-17 LAB — ACTH STIMULATION, 3 TIME POINTS
Cortisol, 30 Min: 31 ug/dL
Cortisol, 60 Min: 34.8 ug/dL
Cortisol, Base: 19.6 ug/dL

## 2019-04-17 LAB — GLUCOSE, CAPILLARY
Glucose-Capillary: 102 mg/dL — ABNORMAL HIGH (ref 70–99)
Glucose-Capillary: 157 mg/dL — ABNORMAL HIGH (ref 70–99)
Glucose-Capillary: 177 mg/dL — ABNORMAL HIGH (ref 70–99)
Glucose-Capillary: 128 mg/dL — ABNORMAL HIGH (ref 70–99)
Glucose-Capillary: 117 mg/dL — ABNORMAL HIGH (ref 70–99)
Glucose-Capillary: 137 mg/dL — ABNORMAL HIGH (ref 70–99)

## 2019-04-17 MED ORDER — LEVALBUTEROL HCL 0.63 MG/3ML IN NEBU
0.6300 mg | INHALATION_SOLUTION | Freq: Four times a day (QID) | RESPIRATORY_TRACT | Status: DC | PRN
  Administered 2019-04-17 – 2019-04-26 (×3): 0.63 mg via RESPIRATORY_TRACT
  Filled 2019-04-17 (×5): qty 3

## 2019-04-17 MED ORDER — INSULIN GLARGINE 100 UNIT/ML ~~LOC~~ SOLN
30.0000 [IU] | Freq: Every day | SUBCUTANEOUS | Status: DC
  Administered 2019-04-17 – 2019-04-20 (×4): 30 [IU] via SUBCUTANEOUS
  Filled 2019-04-17 (×5): qty 0.3

## 2019-04-17 MED ORDER — HEPARIN SODIUM (PORCINE) 5000 UNIT/ML IJ SOLN: 5000.0000 [IU] | Freq: Two times a day (BID) | INTRAMUSCULAR | Status: DC

## 2019-04-17 MED ORDER — POTASSIUM CHLORIDE 20 MEQ/15ML (10%) PO SOLN
20.0000 meq | Freq: Every day | ORAL | Status: DC
  Administered 2019-04-17 – 2019-04-18 (×2): 20 meq via ORAL
  Filled 2019-04-17 (×3): qty 15

## 2019-04-17 MED ORDER — NEPRO/CARBSTEADY PO LIQD
840.0000 mL | ORAL | Status: DC
  Administered 2019-04-17: 840 mL via ORAL

## 2019-04-17 MED ORDER — MAGNESIUM SULFATE 2 GM/50ML IV SOLN
2.0000 g | Freq: Once | INTRAVENOUS | Status: AC
  Administered 2019-04-17: 2 g via INTRAVENOUS
  Filled 2019-04-17: qty 50

## 2019-04-17 MED ORDER — PRO-STAT SUGAR FREE PO LIQD
60.0000 mL | Freq: Two times a day (BID) | ORAL | Status: DC
  Administered 2019-04-17 – 2019-04-24 (×14): 60 mL
  Filled 2019-04-17 (×10): qty 60

## 2019-04-17 MED ORDER — SIMETHICONE 80 MG PO CHEW
80.0000 mg | CHEWABLE_TABLET | Freq: Two times a day (BID) | ORAL | Status: DC
  Administered 2019-04-17 – 2019-05-03 (×31): 80 mg via ORAL
  Filled 2019-04-17 (×31): qty 1

## 2019-04-17 MED ORDER — SEVELAMER CARBONATE 0.8 G PO PACK
1.6000 g | PACK | Freq: Three times a day (TID) | ORAL | Status: DC
  Administered 2019-04-17 – 2019-04-18 (×3): 1.6 g
  Filled 2019-04-17 (×4): qty 2

## 2019-04-17 NOTE — Progress Notes (Signed)
Pt tachypneic with respiratory rate in the high 20s and low 30s and c/o some difficulty with breathing. Wheezes audible on auscultation. Dr. Lynetta Mare at bedside to round. Updated of pt condition and notified of change. New order received. RT notified.

## 2019-04-17 NOTE — Progress Notes (Signed)
NAME:  Jesse Murphy, MRN:  [id number], DOB:  03-Apr-1928, LOS: 68 ADMISSION DATE:  03/13/2019, CONSULTATION DATE:  2/2 REFERRING MD:  Nils Pyle, CHIEF COMPLAINT:  Dyspnea   Brief History   86 y/o male admitted 1/26, underwent CABG and L atrial appendage clipping on 1/29, moved to ICU on 2/1 for intubation.  Past Medical History  A fib, CAD, Prostate cancer, HTN  Significant Hospital Events   1/29 CABG 2/01 to ICU 2/05 Extubated 2/08 Reintubated 2/09 tracheostomy 2/09 Impella, PA catheter placed 2/11 CRRT initiated 2/16 leukocytosis, add vancomycin 2/17 start TC trials 2/19 urology placed 3 way foley, bleeding at rectal tube site 2/21 ATC initiated  2/22 1 unit PRBC 2/23 1 unit PRBC. Impella removed 2/25 ATC trach collar. 3/1 Converted to A fib in HD 3/1 Started CVVHD, net negative 50 cc per hour, on Levo at 14  Consults:  Nephrology CHF team Urology  Procedures:  Rt PICC 2/02 >> Trach 2/09 >> PA catheter 2/09 >> Lt [place] HD cath 2/11 >> L Rad Aline 2/3 >>  Impella 2/10 >> 2/23  Significant Diagnostic Tests:  Echo 1/26 >> EF 30 to 35% Echo 2/09 >> EF 50-55%   Micro Data:  SARS CoV2 PCR 1/26 >> negative Influenza PCR 1/26 >> negative Sputum 2/01 >> oral flora Sputum 2/08 >> Pseudomonas >> pan sensitive  Blood 2/16 >> negative Sputum 2/16 >> Pseudomonas sens to cipro, gent, imepenem Resp 2/20>> PA sens cipro, gent, impipenem BCx2 2/20 >> neg  Antimicrobials:  Rocephin 2/01 >> 2/07 Cefepime 2/08 >> 2/10 Fortaz 2/10 >> 2/16 Vancomycin 2/16 >> 2/19 Meropenem 2/20 >> 2/26  Interim history/subjective:  Tolerating trach collar. Tolerates PMV.  CXR is wet compared to 3/1 Keeps having hypotension requiring NE and AF during HD.  Requires Amio IV and PO Behind on diuresis, so CVVHD started for continuous Fluid removal  Objective   Blood pressure (!) 99/43, pulse 81, temperature 98.2 F (36.8 C), temperature source Oral, resp. rate (!) 21, height 5\' 8"  (1.727  m), weight 89.5 kg, SpO2 94 %. CVP:  [4 mmHg-14 mmHg] 7 mmHg  FiO2 (%):  [28 %] 28 %   Intake/Output Summary (Last 24 hours) at 04/17/2019 1121 Last data filed at 04/17/2019 1100 Gross per 24 hour  Intake 1965.87 ml  Output 3108 ml  Net -1142.13 ml   Filed Weights   04/16/19 0500 04/16/19 1155 04/17/19 0500  Weight: 91.6 kg 91.7 kg 89.5 kg   Physical Exam: General: Elderly-appearing, no acute distress, awake and alert HENT: [place], AT, OP clear, MMM, No LAD  Neck: Cuffed trach in place, c/d/i, PM valve in place, 28% ATC Eyes: EOMI, no scleral icterus Respiratory: Bilateral chest excursion, + wheezing, few crackles per bases Cardiovascular: RRR, -M/R/G, no JVD GI: BS+, soft, nontender, ND, BS +, TF infusing Extremities: 2+ pitting edema x4 ,-tenderness, no cyanosis Neuro: AAO x4, CNII-XII grossly intact, conversational, appropriate GU: Foley in place with clear urine  Resolved Hospital Problem list   Pseudomonas HCAP s/p completion meropenem  Assessment & Plan:   Acute Hypoxemic Respiratory Failure due to acute pulmonary edema, pneumonia. Small Left Pleural Post-pericardiotomy Effusion  S/p tracheostomy 2/9 CXR 3/2 with bilateral effusions, L>R with bibasilar atelectasis and mild vascular congestion Some desats 3/2  Off vent >72 hours. Plan  -Trach collar as tolerated - Trend CXR - Sat goal > 94% - Consider increasing net negative per hour ( Currently at 50) - Routine trach care - Speech following. Tolerating PMV  for talking and eating - Will hold on changing trach to cuffless until acute critical illness has resolved  Cardiogenic Shock with acute systolic heart failure s/p Impella removal 2/23. Now off inotropes. EF normal per last echo. Atrial fibrillation. Recurrent need for vasopressors is limiting patient's progress and is not a long-term outpatient option.  Persistent impaired vasoreactivity. -Vasopressor and midodrine management to keep MAP>65 -ACTH stimulation test  to rule out relative adrenal insufficiency.  ( Base 19.6 >>30 min 31 >> 60 min 34.8) -Trial of vitamin C, B12 and thiamine supplementation.    Persistent Hematuria with retained clots - hematuria clearing -Appreciate Urology input. Weaning off CBI  AKI secondary to Cardiogenic Shock. CRRT clotted off 2/24.>> Now using citrate as restarted 3/1 Hypomag>> replete 3/2 Plan - Transitioned to CVVHD 3/2 with 50 cc net negative - Trend BMET daily - Replete electrolytes daily  Anemia of critical illness. Some flexiseal trauma and hematuria as above -trend CBC  -transfuse for Hgb <7% or significant bleeding  -Holding heparin gtt  DM2 with hyperglycemia, poorly controlled -SSI  -lantus 26 units QD  Pressure wounds / Reported as Skin Tear 2/15 -WOC following, appreciate input    Best practice:  Diet: Dysphagia 1 diet DVT prophylaxis: SCD's GI prophylaxis: PPI Mobility: bed rest Code Status: Partial Code  Disposition: ICU  Labs    CMP Latest Ref Rng & Units 04/17/2019 04/17/2019 04/17/2019  Glucose 70 - 99 mg/dL 196(H) 164(H) 271(H)  BUN 8 - 23 mg/dL 23 25(H) 28(H)  Creatinine 0.61 - 1.24 mg/dL 2.00(H) 2.50(H) 2.51(H)  Sodium 135 - 145 mmol/L 138 137 132(L)  Potassium 3.5 - 5.1 mmol/L 3.7 3.7 3.6  Chloride 98 - 111 mmol/L 92(L) 93(L) 93(L)  CO2 22 - 32 mmol/L - - 27  Calcium 8.9 - 10.3 mg/dL - - 10.8(H)  Total Protein 6.5 - 8.1 g/dL - - -  Total Bilirubin 0.3 - 1.2 mg/dL - - -  Alkaline Phos 38 - 126 U/L - - -  AST 15 - 41 U/L - - -  ALT 0 - 44 U/L - - -    CBC Latest Ref Rng & Units 04/17/2019 04/17/2019 04/17/2019  WBC 4.0 - 10.5 K/uL - - 9.1  Hemoglobin 13.0 - 17.0 g/dL 9.9(L) 9.2(L) 8.4(L)  Hematocrit 39.0 - 52.0 % 29.0(L) 27.0(L) 26.1(L)  Platelets 150 - 400 K/uL - - 224    ABG    Component Value Date/Time   PHART 7.488 (H) 04/07/2019 0405   PCO2ART 34.4 04/07/2019 0405   PO2ART 101.0 04/07/2019 0405   HCO3 25.9 04/07/2019 0405   TCO2 28 04/17/2019 0803   ACIDBASEDEF  1.0 04/05/2019 0540   O2SAT 67.8 04/14/2019 1253    CBG (last 3)  Recent Labs    04/16/19 2337 04/17/19 0402 04/17/19 0750  GLUCAP 133* 102* 157*   PCCM APP time 40 minutes  Magdalen Spatz, MSN, AGACNP-BC [place] Pager # [phone] After 4 pm please call [phone] 04/17/2019 11:21 AM

## 2019-04-17 NOTE — Progress Notes (Signed)
Patient ID: Jesse Murphy, male   DOB: [birth year], 86 y.o.   MRN: [id number]    Advanced Heart Failure Rounding Note  PCP-Cardiologist: Kate Sable, MD   Subjective:    Impella removed 2/23. Intraoperative TEE showed EF ~40%  Remains on TC.   Started on iHD yesterday but had to abort due to low BP and recurrent AF. Now back on CVVHD at -50. Tolerating well. On NE at 74 however. Back in NSR on amio gtt.   Minimal urine output. < 50cc. No hematuria.   Denies CP or SOB. Still swollen.   Objective:   Weight Range: 89.5 kg Body mass index is 30 kg/m.   Vital Signs:   Temp:  [97.4 F (36.3 C)-98.2 F (36.8 C)] 98.2 F (36.8 C) (03/02 0727) Pulse Rate:  [69-124] 81 (03/02 0945) Resp:  [11-32] 22 (03/02 0945) BP: (81-134)/(38-96) 81/46 (03/02 0945) SpO2:  [89 %-100 %] 94 % (03/02 0945) FiO2 (%):  [28 %] 28 % (03/02 0726) Weight:  [89.5 kg-91.7 kg] 89.5 kg (03/02 0500) Last BM Date: 04/16/19  Weight change: Filed Weights   04/16/19 0500 04/16/19 1155 04/17/19 0500  Weight: 91.6 kg 91.7 kg 89.5 kg    Intake/Output:   Intake/Output Summary (Last 24 hours) at 04/17/2019 1006 Last data filed at 04/17/2019 0909 Gross per 24 hour  Intake 1811.67 ml  Output 2800 ml  Net -988.33 ml      Physical Exam   General:  Sitting up in bed. Trach capped. On CVVHD HEENT: normal Neck: supple. JVP 9 + Trach  Carotids 2+ bilat; no bruits. No lymphadenopathy or thryomegaly appreciated. Cor: PMI nondisplaced. Regular rate & rhythm. No rubs, gallops or murmurs. L chest Trialysis. R chest wound ok.  Lungs: clear Abdomen: soft, nontender, nondistended. No hepatosplenomegaly. No bruits or masses. Good bowel sounds. Extremities: no cyanosis, clubbing, rash, 2+ edema Neuro: alert & orientedx3, cranial nerves grossly intact. moves all 4 extremities w/o difficulty. Affect pleasant   Telemetry   NSR 80 Personally reviewed  Labs    CBC Recent Labs    04/16/19 0602 04/16/19 2010  04/17/19 0450 04/17/19 0450 04/17/19 0753 04/17/19 0803  WBC 9.5  --  9.1  --   --   --   HGB 8.9*   < > 8.4*   < > 9.2* 9.9*  HCT 27.7*   < > 26.1*   < > 27.0* 29.0*  MCV 92.6  --  92.2  --   --   --   PLT 233  --  224  --   --   --    < > = values in this interval not displayed.   Basic Metabolic Panel Recent Labs    04/16/19 0602 04/16/19 0602 04/16/19 1830 04/16/19 2010 04/17/19 0450 04/17/19 0450 04/17/19 0753 04/17/19 0803  NA 134*   < > 135   < > 132*   < > 137 138  K 4.9   < > 4.0   < > 3.6   < > 3.7 3.7  CL 97*   < > 98   < > 93*   < > 93* 92*  CO2 22   < > 24  --  27  --   --   --   GLUCOSE 79   < > 198*   < > 271*   < > 164* 196*  BUN 92*   < > 42*   < > 28*   < > 25*  23  CREATININE 6.36*   < > 3.56*   < > 2.51*   < > 2.50* 2.00*  CALCIUM 7.7*   < > 7.1*  --  10.8*  --   --   --   MG 2.5*  --   --   --  1.7  --   --   --   PHOS 8.9*   < > 4.6  --  2.9  --   --   --    < > = values in this interval not displayed.   Liver Function Tests Recent Labs    04/16/19 1830 04/17/19 0450  ALBUMIN 2.2* 2.0*   No results for input(s): LIPASE, AMYLASE in the last 72 hours. Cardiac Enzymes No results for input(s): CKTOTAL, CKMB, CKMBINDEX, TROPONINI in the last 72 hours.  BNP: BNP (last 3 results) Recent Labs    03/22/19 0401 03/26/19 0626 03/27/19 1255  BNP 340.4* 687.5* 800.4*    ProBNP (last 3 results) No results for input(s): PROBNP in the last 8760 hours.   D-Dimer No results for input(s): DDIMER in the last 72 hours. Hemoglobin A1C No results for input(s): HGBA1C in the last 72 hours. Fasting Lipid Panel No results for input(s): CHOL, HDL, LDLCALC, TRIG, CHOLHDL, LDLDIRECT in the last 72 hours. Thyroid Function Tests No results for input(s): TSH, T4TOTAL, T3FREE, THYROIDAB in the last 72 hours.  Invalid input(s): FREET3  Other results:   Imaging    DG Chest Port 1 View  Result Date: 04/17/2019 CLINICAL DATA:  Tracheostomy, recent CABG  EXAM: PORTABLE CHEST 1 VIEW COMPARISON:  04/14/2019 FINDINGS: Tracheostomy, right PICC line and left Vas-Cath remain in place, unchanged. Prior CABG. Bilateral pleural effusions, left greater than right. Heart is borderline in size with vascular congestion and bibasilar atelectasis. Findings similar to prior study. IMPRESSION: Continued bilateral effusions, left greater than right with bibasilar atelectasis and mild vascular congestion. No real change. Electronically Signed   By: Rolm Baptise M.D.   On: 04/17/2019 08:51     Medications:     Scheduled Medications: . sodium chloride   Intravenous Once  . amiodarone  200 mg Oral Daily  . vitamin C  500 mg Oral TID  . aspirin  81 mg Oral Daily  . atorvastatin  80 mg Oral q1800  . chlorhexidine gluconate (MEDLINE KIT)  15 mL Mouth Rinse BID  . Chlorhexidine Gluconate Cloth  6 each Topical Q0600  . Chlorhexidine Gluconate Cloth  6 each Topical Q0600  . darbepoetin (ARANESP) injection - NON-DIALYSIS  60 mcg Subcutaneous Q Tue-1800  . feeding supplement (NEPRO CARB STEADY)  237 mL Oral TID BM  . feeding supplement (PRO-STAT SUGAR FREE 64)  30 mL Oral BID  . Gerhardt's butt cream   Topical BID  . heparin injection (subcutaneous)  5,000 Units Subcutaneous Q8H  . hydrocortisone   Rectal BID  . insulin aspart  0-24 Units Subcutaneous Q4H  . insulin glargine  30 Units Subcutaneous Daily  . mouth rinse  15 mL Mouth Rinse 10 times per day  . midodrine  15 mg Oral TID WC  . multivitamin  1 tablet Oral QHS  . pantoprazole (PROTONIX) IV  40 mg Intravenous Q24H  . potassium chloride  20 mEq Oral Daily  . sevelamer carbonate  1.6 g Per Tube TID WC  . sodium chloride flush  10-40 mL Intracatheter Q12H  . thiamine  250 mg Per Tube Daily  . vitamin B-12  1,000 mcg Per Tube Daily  Infusions: . sodium chloride    . sodium chloride    . sodium chloride    . amiodarone 30 mg/hr (04/17/19 0909)  . calcium gluconate infusion for CRRT 60 mL/hr at  04/17/19 0807  . norepinephrine (LEVOPHED) Adult infusion 14 mcg/min (04/17/19 0900)  . prismasol B22GK 4/0 300 mL/hr at 04/16/19 1715  . prismasol B22GK 4/0 1,500 mL/hr at 04/17/19 0657  . sodium citrate 2 %/dextrose 2.5% solution 3000 mL 300 mL/hr at 04/17/19 0440    PRN Medications: sodium chloride, sodium chloride, sodium chloride, oxyCODONE **AND** acetaminophen, dextrose, fentaNYL (SUBLIMAZE) injection, heparin, hyoscyamine, lidocaine (PF), lidocaine-prilocaine, ondansetron (ZOFRAN) IV, pentafluoroprop-tetrafluoroeth, Resource ThickenUp Clear, sodium chloride, sodium chloride flush, sodium chloride flush    Assessment/Plan   1. Acute systolic HF -> Cardiogenic shock - post-op echo on 03/20/19 EF 25-30% (pre-op 30-35%. In 12/2017 EF normal) - Required Impella support post CABG. Impella removed 2/23 - weight up only about 5 pounds from pre-op weight but significant peripheral edema.  - failed iHD on 3/1. Now back on CVVHD. Tolerating well but requiring NE 14 and midodrine 15 tid to maintain pressure - Will remove fluid for 24-48 hours with CVVHD then stop and try to switch back to iHD  - Liberalize salt in diet. + TED hose. May need ab binder or florinef.   2. CAD s/p CABG this admit (LIMA->LAD, SVG->OM, SVG->RCA on 03/15/19) - No s/s angina - Continue ASA +statin  3. Acute hypoxic respiratory failure with Pseudomonas PNA - s/p trach on 03/27/19 - on TC with PMV today.  Doing well  - completed meropenem for pseudomonas PNA  4. PAF - s/p MAZE and LAA occlusion - continues to go in/out of AF - back on IV amio  - Off systemic heparin with hematuria and melena/ anemia.  - DVT dose enox started on 3/1 toelrating   5. AKI  - due to shock/ATN - Started [facility] 03/29/19 per Nephrology.  - Back on CVVHD. See discussion above   6. Hematuria  -Improved. Off CBI. - Urology following  7. Melena - No melena overnight.   8. Anemia -Hematuria/melena. -Hgb 8.4 -> 8.2 -> 8.9 -> 8.4  today  9. Debility, severe - continue PT - repeat OOB and in chair today  10. Hyperkalemia - K 3.7 today. Getting HD  11. UE swelling - u/s negative for DVT. Wrap/elevate  12. F/E/N - for cor-trak today to permit night feeding   CRITICAL CARE Performed by: Glori Bickers  Total critical care time: 35 minutes  Critical care time was exclusive of separately billable procedures and treating other patients.  Critical care was necessary to treat or prevent imminent or life-threatening deterioration.  Critical care was time spent personally by me (independent of midlevel providers or residents) on the following activities: development of treatment plan with patient and/or surrogate as well as nursing, discussions with consultants, evaluation of patient's response to treatment, examination of patient, obtaining history from patient or surrogate, ordering and performing treatments and interventions, ordering and review of laboratory studies, ordering and review of radiographic studies, pulse oximetry and re-evaluation of patient's condition.    Glori Bickers, MD  10:06 AM

## 2019-04-17 NOTE — Progress Notes (Signed)
[facility] Progress Note     Assessment/ Plan:    [facility] Progress Note  1. AKI(Cr was 0.7 prior to surgery)- due to postoperative CHF/cardiogenic shock that was refractory to diuretics and started on CRRT on 03/29/19 for volume removal. CRRT off on 2/23 - discussion with advanced CHF. CRRT restarted 2/24 then off after clotting. First HD on 2/26 with minimal UF and note increased pressors and rapid afib 1. Seen on HD  Today and Levo @ 27mg. Only 1.5L net UF goal; if he were to remain on iHD he would need daily RRT. Unfortunately we had to restart CRRT on 3/1 and there have been no issues with clotting as he's on citrate. 2. Will increase net UF goal to 100 and then 140 as tolerated as long as CVP is above 5; he's more dyspneic now and I wonder if it's bec of pulmonary edema with the TEDS stockings. CVP incr to 8 with the TEDS and within 30-40 min pt was c/o dyspnea. 3. On low dose Levo at this time. 2. Acute systolic CHF/cardiogenic shock- EF 25-30%; s/p Impella- now out. optimize volume as above. On midodrine - note increase to 15 mg TID. 3. CAD s/p CABG on 14/27/06complicated by cardiogenic shock and ARF. 4. Acute hypoxic respiratory failure with pseudomonas pneumonia- s/p trach on 03/27/19.s/pabx per PCCM  5. PAF- s/p MAZE and LAA occlusion- therapy per CHF team 6. Hematuria- bladder irrigationper urology recommendations; if possible would be nice to stop the CBI to get a better idea of his UOP. 7. ABLA- Hx Melena. Transfuse as needed. Have given RRBC's on 2/22 and 2/23. And 2/25. Gave aranesp 2/23 40 mcg and have increasednext dose to 60 mcg for 3/2 for now.  8. Protein malnutrition- moderate to severe 9. Secondary hyperparathyroidism/bone mineral - On renvela. Check intact PTH.  10. Disposition- Am concerned that he is a poor longterm dialysis candidate given his cardiogenic shock and trach.  Subjective:   C/o dyspnea Secretions  minimal; just checked @ bedside.   Objective:   BP (!) 125/37   Pulse 84   Temp 98.2 F (36.8 C) (Oral)   Resp 19   Ht '5\' 8"'  (1.727 m)   Wt 89.5 kg   SpO2 95%   BMI 30.00 kg/m   Intake/Output Summary (Last 24 hours) at 04/17/2019 1218 Last data filed at 04/17/2019 1200 Gross per 24 hour  Intake 2064.44 ml  Output 2350 ml  Net -285.56 ml   Weight change: 0.1 kg  Physical Exam: Gen: elderly male in bed chronically ill-appearing HEENT trach in place; on vent  CVS: S1 S2 no rub Resp:clear;unlabored at rest; trach collar Abd: soft/ NT/ND  Ext: 2+edema, TEDS stockings on Neuro - awake and interactive and follows commands, nods Psych - no anxiety or agitation  GU foley in place withirrigation Access: left chest nontunneled dialysis catheter in lt SCV  Imaging: DG Chest Port 1 View  Result Date: 04/17/2019 CLINICAL DATA:  Tracheostomy, recent CABG EXAM: PORTABLE CHEST 1 VIEW COMPARISON:  04/14/2019 FINDINGS: Tracheostomy, right PICC line and left Vas-Cath remain in place, unchanged. Prior CABG. Bilateral pleural effusions, left greater than right. Heart is borderline in size with vascular congestion and bibasilar atelectasis. Findings similar to prior study. IMPRESSION: Continued bilateral effusions, left greater than right with bibasilar atelectasis and mild vascular congestion. No real change. Electronically Signed   By: KRolm BaptiseM.D.   On: 04/17/2019 08:51    Labs: BMET Recent Labs  Lab 04/12/19 0508 04/12/19 0508 04/12/19 1351 04/12/19 1351 04/13/19 0427 04/13/19 0427 04/14/19 0243 04/14/19 0243 04/15/19 0403 04/15/19 0403 04/16/19 0602 04/16/19 0602 04/16/19 1830 04/16/19 2010 04/17/19 0153 04/17/19 0201 04/17/19 0400 04/17/19 0412 04/17/19 0450 04/17/19 0753 04/17/19 0803  NA 137   < > 135   < > 134*   < > 138   < > 136   < > 134*   < > 135   < > 137 134* 138 133* 132* 137 138  K 5.1   < > 4.7   < > 4.4   < > 4.0   < > 4.4   < > 4.9   < > 4.0   < >  3.8 4.0 3.8 3.7 3.6 3.7 3.7  CL 101   < > 99   < > 99   < > 102   < > 100   < > 97*   < > 98   < > 95* 94* 94* 92* 93* 93* 92*  CO2 22   < > 20*  --  20*  --  25  --  23  --  22  --  24  --   --   --   --   --  27  --   --   GLUCOSE 151*   < > 201*   < > 145*   < > 84   < > 79   < > 79   < > 198*   < > 131* 161* 155* 202* 271* 164* 196*  BUN 60*   < > 73*   < > 95*   < > 59*   < > 76*   < > 92*   < > 42*   < > 25* 34* 24* 26* 28* 25* 23  CREATININE 3.49*   < > 3.99*   < > 4.90*   < > 3.95*   < > 5.34*   < > 6.36*   < > 3.56*   < > 2.50* 3.00* 2.30* 2.80* 2.51* 2.50* 2.00*  CALCIUM 7.6*   < > 7.5*   < > 7.6*  --  7.7*  --  7.7*  7.4*  --  7.7*  --  7.1*  --   --   --   --   --  10.8*  --   --   PHOS 6.0*  --   --   --  8.0*  --  5.9*  --  7.5*  --  8.9*  --  4.6  --   --   --   --   --  2.9  --   --    < > = values in this interval not displayed.   CBC Recent Labs  Lab 04/14/19 0242 04/14/19 0242 04/15/19 0403 04/15/19 0403 04/16/19 0602 04/16/19 2010 04/17/19 0412 04/17/19 0450 04/17/19 0753 04/17/19 0803  WBC 8.1  --  8.5  --  9.5  --   --  9.1  --   --   HGB 8.4*   < > 8.3*   < > 8.9*   < > 8.8* 8.4* 9.2* 9.9*  HCT 26.3*   < > 26.5*   < > 27.7*   < > 26.0* 26.1* 27.0* 29.0*  MCV 93.3  --  95.0  --  92.6  --   --  92.2  --   --   PLT 163  --  182  --  233  --   --  224  --   --    < > = values in this interval not displayed.    Medications:    . sodium chloride   Intravenous Once  . amiodarone  200 mg Oral Daily  . vitamin C  500 mg Oral TID  . aspirin  81 mg Oral Daily  . atorvastatin  80 mg Oral q1800  . chlorhexidine gluconate (MEDLINE KIT)  15 mL Mouth Rinse BID  . Chlorhexidine Gluconate Cloth  6 each Topical Q0600  . Chlorhexidine Gluconate Cloth  6 each Topical Q0600  . darbepoetin (ARANESP) injection - NON-DIALYSIS  60 mcg Subcutaneous Q Tue-1800  . feeding supplement (NEPRO CARB STEADY)  237 mL Oral TID BM  . feeding supplement (PRO-STAT SUGAR FREE 64)  60 mL Per  Tube BID  . Gerhardt's butt cream   Topical BID  . heparin injection (subcutaneous)  5,000 Units Subcutaneous Q8H  . hydrocortisone   Rectal BID  . insulin aspart  0-24 Units Subcutaneous Q4H  . insulin glargine  30 Units Subcutaneous Daily  . mouth rinse  15 mL Mouth Rinse 10 times per day  . midodrine  15 mg Oral TID WC  . multivitamin  1 tablet Oral QHS  . pantoprazole (PROTONIX) IV  40 mg Intravenous Q24H  . potassium chloride  20 mEq Oral Daily  . sevelamer carbonate  1.6 g Per Tube TID WC  . sodium chloride flush  10-40 mL Intracatheter Q12H  . thiamine  250 mg Per Tube Daily  . vitamin B-12  1,000 mcg Per Tube Daily      Otelia Santee, MD 04/17/2019, 12:18 PM

## 2019-04-17 NOTE — Procedures (Signed)
[name]  Person Inserting Tube:  Ahmia Colford, Creola Corn, RD Tube Type:  [name] - 43 inches Tube Location:  Left nare Initial Placement:  Stomach Secured by: Bridle Technique Used to Measure Tube Placement:  Documented cm marking at nare/ corner of mouth [name] Secured At:  81 cm   [name] Tube Team Note:  Consult received to place a [name] feeding tube.   No x-ray is required. RN may begin using tube.   If the tube becomes dislodged please keep the tube and contact the [name] team at [email or web address] (password [email or web address]) for replacement.  If after hours and replacement cannot be delayed, place a NG tube and confirm placement with an abdominal x-ray.    Larkin Ina, MS, RD, LDN RD pager number and weekend/on-call pager number located in [place].

## 2019-04-17 NOTE — Progress Notes (Signed)
Physical Therapy Treatment Patient Details Name: Jesse Murphy MRN: [id number] DOB: Jul 11, 1928 Today's Date: 04/17/2019    History of Present Illness 86 y.o. male with PMH: h/o CAD s/p CABG and clipping of left atrial appendage, who presented with SOB on 2/1. He became hypoxic on 2/2 with pulmonary edema and needed to be intubated on 2/2 and extubated 2/5. Pt underwent impella placement and tracheostomy on 2/9. Pt started on CRRT 2/11. Impella removal and tracheostomy 2/23. CRRT restarted 3/1 as pt could not tolerate HD.    PT Comments    Patient progressing slowly towards PT goals. Pt alert and conversive with PMV. Requires Mod-Max A for bed mobility and rolling. Able to sit EOB with close Min guard with UE support but limited sitting tolerance due to dizziness and hypotension. Pt on trach collar 28% Fi02, 5L; Supine BP 108/33 (51), sitting BP 94/52 (64), supine BP 111/43 (64). Sp02 >90% during session. Dizziness did not improve with there ex. Tolerated there ex of LEs. Performed family training to assist with HEP. Encouraged sitting EOB more frequently to improve upright tolerance. Will follow.   Follow Up Recommendations  SNF;Supervision/Assistance - 24 hour     Equipment Recommendations  Other (comment)(defer to post acute)    Recommendations for Other Services       Precautions / Restrictions Precautions Precautions: Fall;Sternal Precaution Booklet Issued: No Precaution Comments: CRRT, NG tube Restrictions Weight Bearing Restrictions: Yes Other Position/Activity Restrictions: sternal precautions    Mobility  Bed Mobility Overal bed mobility: Needs Assistance Bed Mobility: Rolling;Supine to Sit;Sit to Supine Rolling: Mod assist   Supine to sit: Max assist;+2 for physical assistance;HOB elevated Sit to supine: Max assist;+2 for physical assistance;HOB elevated   General bed mobility comments: Rolling to right/left with Mod A to change bed sheets; assist with RLE, bottom and  trunk to get to EOB and return to supine.  Transfers                 General transfer comment: Deferred due to dizziness.  Ambulation/Gait                 Stairs             Wheelchair Mobility    Modified Rankin (Stroke Patients Only)       Balance Overall balance assessment: Needs assistance Sitting-balance support: Feet supported;Bilateral upper extremity supported Sitting balance-Leahy Scale: Fair Sitting balance - Comments: minG-close supervision, "I feel like I might fall over at any second."                                    Cognition Arousal/Alertness: Awake/alert Behavior During Therapy: WFL for tasks assessed/performed Overall Cognitive Status: Within Functional Limits for tasks assessed                                 General Comments: Able to speak with PMV; Needs repetition of cues to follow commands. Appears WFL for basic mobility tasks.      Exercises General Exercises - Lower Extremity Ankle Circles/Pumps: AROM;Both;15 reps;Supine Quad Sets: AROM;Both;10 reps;Supine Long Arc Quad: AROM;Both;10 reps;Seated Heel Slides: AAROM;Right;5 reps;Supine Hip ABduction/ADduction: AAROM;Right;5 reps;Supine    General Comments General comments (skin integrity, edema, etc.): Pt on trach collar 28% Fi02, 5L; Supine BP 108/33 (51), sitting BP 111/43 (64). Dizziness with sitting upright that did not improve  with there ex. Son present for session.      Pertinent Vitals/Pain Pain Assessment: Faces Faces Pain Scale: Hurts little more Pain Location: right hip with movement Pain Descriptors / Indicators: Grimacing;Discomfort Pain Intervention(s): Repositioned;Monitored during session    Home Living                      Prior Function            PT Goals (current goals can now be found in the care plan section) Progress towards PT goals: Not progressing toward goals - comment(hypotension, dizziness)     Frequency    Min 2X/week      PT Plan Current plan remains appropriate    Co-evaluation              AM-PAC PT "6 Clicks" Mobility   Outcome Measure  Help needed turning from your back to your side while in a flat bed without using bedrails?: A Lot Help needed moving from lying on your back to sitting on the side of a flat bed without using bedrails?: A Lot Help needed moving to and from a bed to a chair (including a wheelchair)?: Total Help needed standing up from a chair using your arms (e.g., wheelchair or bedside chair)?: Total Help needed to walk in hospital room?: Total Help needed climbing 3-5 steps with a railing? : Total 6 Click Score: 8    End of Session Equipment Utilized During Treatment: Oxygen(trach collar, 5L 28% Fi02) Activity Tolerance: Treatment limited secondary to medical complications (Comment)(dizziness, hypotension) Patient left: in bed;with call bell/phone within reach;with bed alarm set;with SCD's reapplied Nurse Communication: Mobility status PT Visit Diagnosis: Other abnormalities of gait and mobility (R26.89);Muscle weakness (generalized) (M62.81)     Time: 6468-0321 PT Time Calculation (min) (ACUTE ONLY): 25 min  Charges:  $Therapeutic Exercise: 8-22 mins $Therapeutic Activity: 8-22 mins                     Marisa Severin, PT, DPT Acute Rehabilitation Services Pager [phone] Office [phone]       Marguarite Arbour A Sabra Heck 04/17/2019, 3:35 PM

## 2019-04-17 NOTE — Progress Notes (Signed)
CT surgery p.m. Rounds  Patient had stable day with negative I/O balance with CRT Maintaining sinus rhythm Core track tube placed this p.m. for nocturnal feedings Worked with physical therapy set up on side of bed No evidence of bladder bleeding-leave catheter in to monitor

## 2019-04-17 NOTE — Progress Notes (Signed)
7 Days Post-Op Procedure(s) (LRB): REMOVAL OF IMPELLA LEFT VENTRICULAR ASSIST DEVICE WITH INSERTION OF RIGHT FEMORAL ARTERIAL LINE (N/A) Subjective: On CRT to remove fluid- down 2 kg CXR with moderate L pleural effusion NSR after stopping HD Objective: Vital signs in last 24 hours: Temp:  [97.4 F (36.3 C)-98.2 F (36.8 C)] 98.2 F (36.8 C) (03/02 0727) Pulse Rate:  [69-124] 77 (03/02 0726) Cardiac Rhythm: Normal sinus rhythm;Bundle branch block (03/02 0400) Resp:  [11-32] 22 (03/02 0726) BP: (84-134)/(38-96) 105/48 (03/02 0726) SpO2:  [89 %-100 %] 93 % (03/02 0726) FiO2 (%):  [28 %] 28 % (03/02 0726) Weight:  [89.5 kg-91.7 kg] 89.5 kg (03/02 0500)  Hemodynamic parameters for last 24 hours: CVP:  [4 mmHg-14 mmHg] 5 mmHg  Intake/Output from previous day: 03/01 0701 - 03/02 0700 In: 1677.8 [P.O.:305; I.V.:1372.8] Out: 2951 [Urine:482] Intake/Output this shift: No intake/output data recorded.       Exam    General- alert and comfortable    Neck- no JVD, no cervical adenopathy palpable, no carotid bruit   Lungs- clear without rales, wheezes   Cor- regular rate and rhythm, no murmur , gallop   Abdomen- soft, non-tender   Extremities - warm, non-tender, minimal edema   Neuro- oriented, appropriate, no focal weakness   Lab Results: Recent Labs    04/16/19 0602 04/16/19 2010 04/17/19 0412 04/17/19 0450  WBC 9.5  --   --  9.1  HGB 8.9*   < > 8.8* 8.4*  HCT 27.7*   < > 26.0* 26.1*  PLT 233  --   --  224   < > = values in this interval not displayed.   BMET:  Recent Labs    04/16/19 1830 04/16/19 2010 04/17/19 0412 04/17/19 0450  NA 135   < > 133* 132*  K 4.0   < > 3.7 3.6  CL 98   < > 92* 93*  CO2 24  --   --  27  GLUCOSE 198*   < > 202* 271*  BUN 42*   < > 26* 28*  CREATININE 3.56*   < > 2.80* 2.51*  CALCIUM 7.1*  --   --  10.8*   < > = values in this interval not displayed.    PT/INR: No results for input(s): LABPROT, INR in the last 72 hours. ABG     Component Value Date/Time   PHART 7.488 (H) 04/07/2019 0405   HCO3 25.9 04/07/2019 0405   TCO2 29 04/17/2019 0412   ACIDBASEDEF 1.0 04/05/2019 0540   O2SAT 67.8 04/14/2019 1253   CBG (last 3)  Recent Labs    04/16/19 2337 04/17/19 0402 04/17/19 0750  GLUCAP 133* 102* 157*    Assessment/Plan: S/P Procedure(s) (LRB): REMOVAL OF IMPELLA LEFT VENTRICULAR ASSIST DEVICE WITH INSERTION OF RIGHT FEMORAL ARTERIAL LINE (N/A) Cont CRT for another 2 days Pm tube feeds with cortrack Iv amio loading  LOS: 34 days    Tharon Aquas Trigt III 04/17/2019

## 2019-04-18 ENCOUNTER — Inpatient Hospital Stay (HOSPITAL_COMMUNITY): Payer: Medicare Other

## 2019-04-18 DIAGNOSIS — Z930 Tracheostomy status: Secondary | ICD-10-CM

## 2019-04-18 LAB — POCT I-STAT, CHEM 8
BUN: 17 mg/dL (ref 8–23)
BUN: 18 mg/dL (ref 8–23)
BUN: 18 mg/dL (ref 8–23)
BUN: 18 mg/dL (ref 8–23)
BUN: 18 mg/dL (ref 8–23)
BUN: 18 mg/dL (ref 8–23)
BUN: 19 mg/dL (ref 8–23)
BUN: 19 mg/dL (ref 8–23)
BUN: 19 mg/dL (ref 8–23)
BUN: 19 mg/dL (ref 8–23)
BUN: 19 mg/dL (ref 8–23)
BUN: 20 mg/dL (ref 8–23)
BUN: 20 mg/dL (ref 8–23)
BUN: 20 mg/dL (ref 8–23)
BUN: 20 mg/dL (ref 8–23)
BUN: 20 mg/dL (ref 8–23)
BUN: 21 mg/dL (ref 8–23)
BUN: 21 mg/dL (ref 8–23)
BUN: 21 mg/dL (ref 8–23)
BUN: 21 mg/dL (ref 8–23)
BUN: 22 mg/dL (ref 8–23)
BUN: 23 mg/dL (ref 8–23)
Calcium, Ion: 0.36 mmol/L — CL (ref 1.15–1.40)
Calcium, Ion: 0.37 mmol/L — CL (ref 1.15–1.40)
Calcium, Ion: 0.38 mmol/L — CL (ref 1.15–1.40)
Calcium, Ion: 0.38 mmol/L — CL (ref 1.15–1.40)
Calcium, Ion: 0.39 mmol/L — CL (ref 1.15–1.40)
Calcium, Ion: 0.39 mmol/L — CL (ref 1.15–1.40)
Calcium, Ion: 0.39 mmol/L — CL (ref 1.15–1.40)
Calcium, Ion: 0.4 mmol/L — CL (ref 1.15–1.40)
Calcium, Ion: 0.41 mmol/L — CL (ref 1.15–1.40)
Calcium, Ion: 0.42 mmol/L — CL (ref 1.15–1.40)
Calcium, Ion: 0.42 mmol/L — CL (ref 1.15–1.40)
Calcium, Ion: 0.47 mmol/L — CL (ref 1.15–1.40)
Calcium, Ion: 0.84 mmol/L — CL (ref 1.15–1.40)
Calcium, Ion: 0.84 mmol/L — CL (ref 1.15–1.40)
Calcium, Ion: 0.85 mmol/L — CL (ref 1.15–1.40)
Calcium, Ion: 0.92 mmol/L — ABNORMAL LOW (ref 1.15–1.40)
Calcium, Ion: 0.93 mmol/L — ABNORMAL LOW (ref 1.15–1.40)
Calcium, Ion: 1.41 mmol/L — ABNORMAL HIGH (ref 1.15–1.40)
Calcium, Ion: 1.88 mmol/L (ref 1.15–1.40)
Calcium, Ion: 1.9 mmol/L (ref 1.15–1.40)
Calcium, Ion: 1.92 mmol/L (ref 1.15–1.40)
Calcium, Ion: 2.29 mmol/L (ref 1.15–1.40)
Chloride: 85 mmol/L — ABNORMAL LOW (ref 98–111)
Chloride: 86 mmol/L — ABNORMAL LOW (ref 98–111)
Chloride: 87 mmol/L — ABNORMAL LOW (ref 98–111)
Chloride: 87 mmol/L — ABNORMAL LOW (ref 98–111)
Chloride: 87 mmol/L — ABNORMAL LOW (ref 98–111)
Chloride: 87 mmol/L — ABNORMAL LOW (ref 98–111)
Chloride: 87 mmol/L — ABNORMAL LOW (ref 98–111)
Chloride: 87 mmol/L — ABNORMAL LOW (ref 98–111)
Chloride: 87 mmol/L — ABNORMAL LOW (ref 98–111)
Chloride: 87 mmol/L — ABNORMAL LOW (ref 98–111)
Chloride: 87 mmol/L — ABNORMAL LOW (ref 98–111)
Chloride: 88 mmol/L — ABNORMAL LOW (ref 98–111)
Chloride: 88 mmol/L — ABNORMAL LOW (ref 98–111)
Chloride: 88 mmol/L — ABNORMAL LOW (ref 98–111)
Chloride: 88 mmol/L — ABNORMAL LOW (ref 98–111)
Chloride: 88 mmol/L — ABNORMAL LOW (ref 98–111)
Chloride: 88 mmol/L — ABNORMAL LOW (ref 98–111)
Chloride: 88 mmol/L — ABNORMAL LOW (ref 98–111)
Chloride: 89 mmol/L — ABNORMAL LOW (ref 98–111)
Chloride: 89 mmol/L — ABNORMAL LOW (ref 98–111)
Chloride: 89 mmol/L — ABNORMAL LOW (ref 98–111)
Chloride: 91 mmol/L — ABNORMAL LOW (ref 98–111)
Creatinine, Ser: 1.2 mg/dL (ref 0.61–1.24)
Creatinine, Ser: 1.3 mg/dL — ABNORMAL HIGH (ref 0.61–1.24)
Creatinine, Ser: 1.3 mg/dL — ABNORMAL HIGH (ref 0.61–1.24)
Creatinine, Ser: 1.3 mg/dL — ABNORMAL HIGH (ref 0.61–1.24)
Creatinine, Ser: 1.3 mg/dL — ABNORMAL HIGH (ref 0.61–1.24)
Creatinine, Ser: 1.3 mg/dL — ABNORMAL HIGH (ref 0.61–1.24)
Creatinine, Ser: 1.3 mg/dL — ABNORMAL HIGH (ref 0.61–1.24)
Creatinine, Ser: 1.4 mg/dL — ABNORMAL HIGH (ref 0.61–1.24)
Creatinine, Ser: 1.4 mg/dL — ABNORMAL HIGH (ref 0.61–1.24)
Creatinine, Ser: 1.4 mg/dL — ABNORMAL HIGH (ref 0.61–1.24)
Creatinine, Ser: 1.4 mg/dL — ABNORMAL HIGH (ref 0.61–1.24)
Creatinine, Ser: 1.4 mg/dL — ABNORMAL HIGH (ref 0.61–1.24)
Creatinine, Ser: 1.5 mg/dL — ABNORMAL HIGH (ref 0.61–1.24)
Creatinine, Ser: 1.5 mg/dL — ABNORMAL HIGH (ref 0.61–1.24)
Creatinine, Ser: 1.6 mg/dL — ABNORMAL HIGH (ref 0.61–1.24)
Creatinine, Ser: 1.6 mg/dL — ABNORMAL HIGH (ref 0.61–1.24)
Creatinine, Ser: 1.6 mg/dL — ABNORMAL HIGH (ref 0.61–1.24)
Creatinine, Ser: 1.7 mg/dL — ABNORMAL HIGH (ref 0.61–1.24)
Creatinine, Ser: 1.7 mg/dL — ABNORMAL HIGH (ref 0.61–1.24)
Creatinine, Ser: 1.7 mg/dL — ABNORMAL HIGH (ref 0.61–1.24)
Creatinine, Ser: 1.7 mg/dL — ABNORMAL HIGH (ref 0.61–1.24)
Creatinine, Ser: 1.8 mg/dL — ABNORMAL HIGH (ref 0.61–1.24)
Glucose, Bld: 137 mg/dL — ABNORMAL HIGH (ref 70–99)
Glucose, Bld: 145 mg/dL — ABNORMAL HIGH (ref 70–99)
Glucose, Bld: 153 mg/dL — ABNORMAL HIGH (ref 70–99)
Glucose, Bld: 155 mg/dL — ABNORMAL HIGH (ref 70–99)
Glucose, Bld: 165 mg/dL — ABNORMAL HIGH (ref 70–99)
Glucose, Bld: 170 mg/dL — ABNORMAL HIGH (ref 70–99)
Glucose, Bld: 171 mg/dL — ABNORMAL HIGH (ref 70–99)
Glucose, Bld: 173 mg/dL — ABNORMAL HIGH (ref 70–99)
Glucose, Bld: 180 mg/dL — ABNORMAL HIGH (ref 70–99)
Glucose, Bld: 191 mg/dL — ABNORMAL HIGH (ref 70–99)
Glucose, Bld: 197 mg/dL — ABNORMAL HIGH (ref 70–99)
Glucose, Bld: 202 mg/dL — ABNORMAL HIGH (ref 70–99)
Glucose, Bld: 212 mg/dL — ABNORMAL HIGH (ref 70–99)
Glucose, Bld: 213 mg/dL — ABNORMAL HIGH (ref 70–99)
Glucose, Bld: 226 mg/dL — ABNORMAL HIGH (ref 70–99)
Glucose, Bld: 233 mg/dL — ABNORMAL HIGH (ref 70–99)
Glucose, Bld: 251 mg/dL — ABNORMAL HIGH (ref 70–99)
Glucose, Bld: 297 mg/dL — ABNORMAL HIGH (ref 70–99)
Glucose, Bld: 324 mg/dL — ABNORMAL HIGH (ref 70–99)
Glucose, Bld: 326 mg/dL — ABNORMAL HIGH (ref 70–99)
Glucose, Bld: 394 mg/dL — ABNORMAL HIGH (ref 70–99)
Glucose, Bld: 85 mg/dL (ref 70–99)
HCT: 23 % — ABNORMAL LOW (ref 39.0–52.0)
HCT: 23 % — ABNORMAL LOW (ref 39.0–52.0)
HCT: 23 % — ABNORMAL LOW (ref 39.0–52.0)
HCT: 24 % — ABNORMAL LOW (ref 39.0–52.0)
HCT: 25 % — ABNORMAL LOW (ref 39.0–52.0)
HCT: 26 % — ABNORMAL LOW (ref 39.0–52.0)
HCT: 27 % — ABNORMAL LOW (ref 39.0–52.0)
HCT: 27 % — ABNORMAL LOW (ref 39.0–52.0)
HCT: 28 % — ABNORMAL LOW (ref 39.0–52.0)
HCT: 28 % — ABNORMAL LOW (ref 39.0–52.0)
HCT: 28 % — ABNORMAL LOW (ref 39.0–52.0)
HCT: 28 % — ABNORMAL LOW (ref 39.0–52.0)
HCT: 28 % — ABNORMAL LOW (ref 39.0–52.0)
HCT: 29 % — ABNORMAL LOW (ref 39.0–52.0)
HCT: 29 % — ABNORMAL LOW (ref 39.0–52.0)
HCT: 29 % — ABNORMAL LOW (ref 39.0–52.0)
HCT: 30 % — ABNORMAL LOW (ref 39.0–52.0)
HCT: 30 % — ABNORMAL LOW (ref 39.0–52.0)
HCT: 30 % — ABNORMAL LOW (ref 39.0–52.0)
HCT: 31 % — ABNORMAL LOW (ref 39.0–52.0)
HCT: 31 % — ABNORMAL LOW (ref 39.0–52.0)
HCT: 31 % — ABNORMAL LOW (ref 39.0–52.0)
Hemoglobin: 10.2 g/dL — ABNORMAL LOW (ref 13.0–17.0)
Hemoglobin: 10.2 g/dL — ABNORMAL LOW (ref 13.0–17.0)
Hemoglobin: 10.2 g/dL — ABNORMAL LOW (ref 13.0–17.0)
Hemoglobin: 10.5 g/dL — ABNORMAL LOW (ref 13.0–17.0)
Hemoglobin: 10.5 g/dL — ABNORMAL LOW (ref 13.0–17.0)
Hemoglobin: 10.5 g/dL — ABNORMAL LOW (ref 13.0–17.0)
Hemoglobin: 7.8 g/dL — ABNORMAL LOW (ref 13.0–17.0)
Hemoglobin: 7.8 g/dL — ABNORMAL LOW (ref 13.0–17.0)
Hemoglobin: 7.8 g/dL — ABNORMAL LOW (ref 13.0–17.0)
Hemoglobin: 8.2 g/dL — ABNORMAL LOW (ref 13.0–17.0)
Hemoglobin: 8.5 g/dL — ABNORMAL LOW (ref 13.0–17.0)
Hemoglobin: 8.8 g/dL — ABNORMAL LOW (ref 13.0–17.0)
Hemoglobin: 9.2 g/dL — ABNORMAL LOW (ref 13.0–17.0)
Hemoglobin: 9.2 g/dL — ABNORMAL LOW (ref 13.0–17.0)
Hemoglobin: 9.5 g/dL — ABNORMAL LOW (ref 13.0–17.0)
Hemoglobin: 9.5 g/dL — ABNORMAL LOW (ref 13.0–17.0)
Hemoglobin: 9.5 g/dL — ABNORMAL LOW (ref 13.0–17.0)
Hemoglobin: 9.5 g/dL — ABNORMAL LOW (ref 13.0–17.0)
Hemoglobin: 9.5 g/dL — ABNORMAL LOW (ref 13.0–17.0)
Hemoglobin: 9.9 g/dL — ABNORMAL LOW (ref 13.0–17.0)
Hemoglobin: 9.9 g/dL — ABNORMAL LOW (ref 13.0–17.0)
Hemoglobin: 9.9 g/dL — ABNORMAL LOW (ref 13.0–17.0)
Potassium: 3.4 mmol/L — ABNORMAL LOW (ref 3.5–5.1)
Potassium: 3.5 mmol/L (ref 3.5–5.1)
Potassium: 3.5 mmol/L (ref 3.5–5.1)
Potassium: 3.5 mmol/L (ref 3.5–5.1)
Potassium: 3.6 mmol/L (ref 3.5–5.1)
Potassium: 3.7 mmol/L (ref 3.5–5.1)
Potassium: 3.7 mmol/L (ref 3.5–5.1)
Potassium: 3.7 mmol/L (ref 3.5–5.1)
Potassium: 3.7 mmol/L (ref 3.5–5.1)
Potassium: 3.7 mmol/L (ref 3.5–5.1)
Potassium: 3.8 mmol/L (ref 3.5–5.1)
Potassium: 3.8 mmol/L (ref 3.5–5.1)
Potassium: 3.9 mmol/L (ref 3.5–5.1)
Potassium: 3.9 mmol/L (ref 3.5–5.1)
Potassium: 3.9 mmol/L (ref 3.5–5.1)
Potassium: 4 mmol/L (ref 3.5–5.1)
Potassium: 4 mmol/L (ref 3.5–5.1)
Potassium: 4 mmol/L (ref 3.5–5.1)
Potassium: 4 mmol/L (ref 3.5–5.1)
Potassium: 4 mmol/L (ref 3.5–5.1)
Potassium: 4.1 mmol/L (ref 3.5–5.1)
Potassium: 4.1 mmol/L (ref 3.5–5.1)
Sodium: 126 mmol/L — ABNORMAL LOW (ref 135–145)
Sodium: 129 mmol/L — ABNORMAL LOW (ref 135–145)
Sodium: 129 mmol/L — ABNORMAL LOW (ref 135–145)
Sodium: 129 mmol/L — ABNORMAL LOW (ref 135–145)
Sodium: 132 mmol/L — ABNORMAL LOW (ref 135–145)
Sodium: 136 mmol/L (ref 135–145)
Sodium: 136 mmol/L (ref 135–145)
Sodium: 136 mmol/L (ref 135–145)
Sodium: 137 mmol/L (ref 135–145)
Sodium: 137 mmol/L (ref 135–145)
Sodium: 137 mmol/L (ref 135–145)
Sodium: 137 mmol/L (ref 135–145)
Sodium: 137 mmol/L (ref 135–145)
Sodium: 138 mmol/L (ref 135–145)
Sodium: 138 mmol/L (ref 135–145)
Sodium: 138 mmol/L (ref 135–145)
Sodium: 138 mmol/L (ref 135–145)
Sodium: 138 mmol/L (ref 135–145)
Sodium: 138 mmol/L (ref 135–145)
Sodium: 139 mmol/L (ref 135–145)
Sodium: 139 mmol/L (ref 135–145)
Sodium: 139 mmol/L (ref 135–145)
TCO2: 30 mmol/L (ref 22–32)
TCO2: 30 mmol/L (ref 22–32)
TCO2: 30 mmol/L (ref 22–32)
TCO2: 31 mmol/L (ref 22–32)
TCO2: 31 mmol/L (ref 22–32)
TCO2: 31 mmol/L (ref 22–32)
TCO2: 31 mmol/L (ref 22–32)
TCO2: 31 mmol/L (ref 22–32)
TCO2: 31 mmol/L (ref 22–32)
TCO2: 31 mmol/L (ref 22–32)
TCO2: 32 mmol/L (ref 22–32)
TCO2: 32 mmol/L (ref 22–32)
TCO2: 33 mmol/L — ABNORMAL HIGH (ref 22–32)
TCO2: 33 mmol/L — ABNORMAL HIGH (ref 22–32)
TCO2: 33 mmol/L — ABNORMAL HIGH (ref 22–32)
TCO2: 34 mmol/L — ABNORMAL HIGH (ref 22–32)
TCO2: 34 mmol/L — ABNORMAL HIGH (ref 22–32)
TCO2: 35 mmol/L — ABNORMAL HIGH (ref 22–32)
TCO2: 35 mmol/L — ABNORMAL HIGH (ref 22–32)
TCO2: 35 mmol/L — ABNORMAL HIGH (ref 22–32)
TCO2: 36 mmol/L — ABNORMAL HIGH (ref 22–32)
TCO2: 37 mmol/L — ABNORMAL HIGH (ref 22–32)

## 2019-04-18 LAB — GLUCOSE, CAPILLARY
Glucose-Capillary: 141 mg/dL — ABNORMAL HIGH (ref 70–99)
Glucose-Capillary: 153 mg/dL — ABNORMAL HIGH (ref 70–99)
Glucose-Capillary: 154 mg/dL — ABNORMAL HIGH (ref 70–99)
Glucose-Capillary: 163 mg/dL — ABNORMAL HIGH (ref 70–99)
Glucose-Capillary: 170 mg/dL — ABNORMAL HIGH (ref 70–99)
Glucose-Capillary: 208 mg/dL — ABNORMAL HIGH (ref 70–99)

## 2019-04-18 LAB — COMPREHENSIVE METABOLIC PANEL
ALT: 7 U/L (ref 0–44)
AST: 16 U/L (ref 15–41)
Albumin: 1.8 g/dL — ABNORMAL LOW (ref 3.5–5.0)
Alkaline Phosphatase: 67 U/L (ref 38–126)
Anion gap: 14 (ref 5–15)
BUN: 20 mg/dL (ref 8–23)
CO2: 29 mmol/L (ref 22–32)
Calcium: >15 mg/dL (ref 8.9–10.3)
Chloride: 82 mmol/L — ABNORMAL LOW (ref 98–111)
Creatinine, Ser: 1.73 mg/dL — ABNORMAL HIGH (ref 0.61–1.24)
GFR calc Af Amer: 40 mL/min — ABNORMAL LOW (ref >60)
GFR calc non Af Amer: 34 mL/min — ABNORMAL LOW (ref >60)
Glucose, Bld: 446 mg/dL — ABNORMAL HIGH (ref 70–99)
Potassium: 3.3 mmol/L — ABNORMAL LOW (ref 3.5–5.1)
Sodium: 125 mmol/L — ABNORMAL LOW (ref 135–145)
Total Bilirubin: 0.6 mg/dL (ref 0.3–1.2)
Total Protein: 4.6 g/dL — ABNORMAL LOW (ref 6.5–8.1)

## 2019-04-18 LAB — CBC
HCT: 26.2 % — ABNORMAL LOW (ref 39.0–52.0)
Hemoglobin: 8 g/dL — ABNORMAL LOW (ref 13.0–17.0)
MCH: 29.2 pg (ref 26.0–34.0)
MCHC: 30.5 g/dL (ref 30.0–36.0)
MCV: 95.6 fL (ref 80.0–100.0)
Platelets: 237 10*3/uL (ref 150–400)
RBC: 2.74 MIL/uL — ABNORMAL LOW (ref 4.22–5.81)
RDW: 15.6 % — ABNORMAL HIGH (ref 11.5–15.5)
WBC: 9.1 10*3/uL (ref 4.0–10.5)
nRBC: 0 % (ref 0.0–0.2)

## 2019-04-18 LAB — MAGNESIUM: Magnesium: 1.8 mg/dL (ref 1.7–2.4)

## 2019-04-18 LAB — CALCIUM, IONIZED: Calcium, Ionized, Serum: 4.4 mg/dL — ABNORMAL LOW (ref 4.5–5.6)

## 2019-04-18 LAB — PHOSPHORUS: Phosphorus: 1.7 mg/dL — ABNORMAL LOW (ref 2.5–4.6)

## 2019-04-18 MED ORDER — METOCLOPRAMIDE HCL 5 MG/ML IJ SOLN
10.0000 mg | Freq: Four times a day (QID) | INTRAMUSCULAR | Status: DC
  Administered 2019-04-18 – 2019-04-20 (×8): 10 mg via INTRAVENOUS
  Filled 2019-04-18 (×8): qty 2

## 2019-04-18 MED ORDER — VITAL 1.5 CAL PO LIQD
1000.0000 mL | ORAL | Status: DC
  Administered 2019-04-18 – 2019-04-21 (×5): 1000 mL
  Filled 2019-04-18 (×7): qty 1000

## 2019-04-18 MED ORDER — PROMETHAZINE HCL 25 MG/ML IJ SOLN
6.2500 mg | Freq: Four times a day (QID) | INTRAMUSCULAR | Status: DC | PRN
  Administered 2019-04-18 – 2019-05-19 (×5): 6.25 mg via INTRAVENOUS
  Filled 2019-04-18 (×4): qty 1

## 2019-04-18 MED ORDER — SODIUM CHLORIDE 0.9 % IR SOLN
3000.0000 mL | Status: DC
  Administered 2019-04-18: 3000 mL

## 2019-04-18 MED ORDER — CHLORHEXIDINE GLUCONATE 0.12 % MT SOLN
OROMUCOSAL | Status: AC
  Filled 2019-04-18: qty 15

## 2019-04-18 MED ORDER — CEFAZOLIN SODIUM-DEXTROSE 1-4 GM/50ML-% IV SOLN
1.0000 g | Freq: Once | INTRAVENOUS | Status: AC
  Administered 2019-04-19: 1 g via INTRAVENOUS
  Filled 2019-04-18: qty 50

## 2019-04-18 NOTE — Progress Notes (Addendum)
[facility] Progress Note     Assessment/ Plan:   1. AKI(Cr was 0.7 prior to surgery)- due to postoperative CHF/cardiogenic shock that was refractory to diuretics and started on CRRT on 03/29/19 for volume removal. CRRT off on 2/23 - discussion with advanced CHF. CRRT restarted 2/24 then off after clotting. First HD on 2/26 with minimal UF and note increased pressors and rapid afib 1. Seen on HDToday and Levo @ 8mcg.Only 1.5L net UF goal; if he were to remain on iHD he would need daily RRT. Unfortunately we had to restart CRRT on 3/1 and there have been no issues with clotting as he's on citrate. 2. Tolerating  net UF goal to 100 and then 140 as tolerated as long as CVP is above 5 (currently 6-8); he's more comfortable  Now. Neg 1116/ 1273/ 1977 past 72 hrs 3. On  Levo 12 mcg at this time. 2. Acute systolic CHF/cardiogenic shock- EF 25-30%; s/p Impella- now out. optimize volume as above. On midodrine - note increase to 15 mg TID. 3. CAD s/p CABG 3V on 03/15/19 complicated by cardiogenic shock and ARF. Postop echo EF 25-30% 4. Acute hypoxic respiratory failure with pseudomonas pneumonia- s/p trach on 03/27/19.s/pabx per PCCM  -> completed Meropenem. 5. PAF- s/p MAZE and LAA occlusion- therapy per CHF team 6. Hematuria- bladder irrigationper urology recommendations; will have to resume CBI for next 24 hrs with the blood clots in the foley. We did 1st scan the bladder and got 0ml. 7. ABLA- Hx Melena. Transfuse as needed. Have given RRBC's on 2/22 and 2/23. And 2/25. Gave aranesp 2/23 40 mcg and then  60 mcg on 3/2  8. Protein malnutrition- moderate to severe 9. Secondary hyperparathyroidism/bone mineral - On renvela. Check intact PTH.  10. Disposition- Am concerned that he is a poor longterm dialysis candidate given his cardiogenic shock and trach.  Subjective:   Some dyspnea this AM but not at time of exam.  Levo 12mcg   Objective:   BP 98/72   Pulse 94    Temp 98.3 F (36.8 C) (Oral)   Resp 19   Ht 5' 8" (1.727 m)   Wt 89.4 kg   SpO2 100%   BMI 29.97 kg/m   Intake/Output Summary (Last 24 hours) at 04/18/2019 1140 Last data filed at 04/18/2019 1100 Gross per 24 hour  Intake 3715.14 ml  Output 6017 ml  Net -2301.86 ml   Weight change: -2.3 kg  Physical Exam: Gen: elderly male in bed chronically ill-appearing HEENT trach in place; on vent  CVS: S1 S2 no rub Resp:clear;unlabored at rest; trach collar Abd: soft/ NT/ND  Ext: 2+edema, TEDS stockings on Neuro - awake and interactive and follows commands, nods Psych - no anxiety or agitation  GU foley in place withirrigation Access: left chest nontunneled dialysis catheterin lt SCV  Imaging: DG CHEST PORT 1 VIEW  Result Date: 04/18/2019 CLINICAL DATA:  Acute respiratory failure. EXAM: PORTABLE CHEST 1 VIEW COMPARISON:  April 17, 2019. FINDINGS: Stable cardiomegaly. Tracheostomy tube is unchanged in position. Left subclavian dialysis catheter is unchanged in position. Right-sided PICC line is unchanged. No pneumothorax is noted. Stable mild left pleural effusion is noted. Stable bibasilar atelectasis or edema is noted. Small right pleural effusion is noted. Bony thorax is unremarkable. IMPRESSION: Stable support apparatus. Stable bibasilar atelectasis or edema is noted with associated pleural effusions. Electronically Signed   By: James  Green Jr M.D.   On: 04/18/2019 09:51   DG Chest Port 1 View    Result Date: 04/17/2019 CLINICAL DATA:  Tracheostomy, recent CABG EXAM: PORTABLE CHEST 1 VIEW COMPARISON:  04/14/2019 FINDINGS: Tracheostomy, right PICC line and left Vas-Cath remain in place, unchanged. Prior CABG. Bilateral pleural effusions, left greater than right. Heart is borderline in size with vascular congestion and bibasilar atelectasis. Findings similar to prior study. IMPRESSION: Continued bilateral effusions, left greater than right with bibasilar atelectasis and mild vascular  congestion. No real change. Electronically Signed   By: Kevin  Dover M.D.   On: 04/17/2019 08:51    Labs: BMET Recent Labs  Lab 04/14/19 0243 04/14/19 0243 04/15/19 0403 04/15/19 0403 04/16/19 0602 04/16/19 0602 04/16/19 1830 04/16/19 2010 04/17/19 0450 04/17/19 0753 04/17/19 1744 04/17/19 1957 04/18/19 0356 04/18/19 0402 04/18/19 0551 04/18/19 0559 04/18/19 0756 04/18/19 0958 04/18/19 1005  NA 138   < > 136   < > 134*   < > 135   < > 132*   < > 137   < > 125* 126* 138 132* 138 139 138  K 4.0   < > 4.4   < > 4.9   < > 4.0   < > 3.6   < > 3.9   < > 3.3* 3.5 3.6 3.4* 3.7 3.7 3.8  CL 102   < > 100   < > 97*   < > 98   < > 93*   < > 95*   < > 82* 85* 88* 87* 88* 88* 88*  CO2 25  --  23  --  22  --  24  --  27  --  29  --  29  --   --   --   --   --   --   GLUCOSE 84   < > 79   < > 79   < > 198*   < > 271*   < > 135*   < > 446* 394* 197* 251* 213* 145* 85  BUN 59*   < > 76*   < > 92*   < > 42*   < > 28*   < > 22   < > 20 20 19 21 19 18 21  CREATININE 3.95*   < > 5.34*   < > 6.36*   < > 3.56*   < > 2.51*   < > 2.02*   < > 1.73* 1.60* 1.30* 1.70* 1.40* 1.30* 1.50*  CALCIUM 7.7*  --  7.7*  7.4*  --  7.7*  --  7.1*  --  10.8*  --  7.2*  --  >15.0*  --   --   --   --   --   --   PHOS 5.9*  --  7.5*  --  8.9*  --  4.6  --  2.9  --  2.0*  --  1.7*  --   --   --   --   --   --    < > = values in this interval not displayed.   CBC Recent Labs  Lab 04/15/19 0403 04/15/19 0403 04/16/19 0602 04/16/19 2010 04/17/19 0450 04/17/19 0753 04/18/19 0356 04/18/19 0402 04/18/19 0559 04/18/19 0756 04/18/19 0958 04/18/19 1005  WBC 8.5  --  9.5  --  9.1  --  9.1  --   --   --   --   --   HGB 8.3*   < > 8.9*   < > 8.4*   < > 8.0*   < > 8.5*   10.2* 9.9* 9.5*  HCT 26.5*   < > 27.7*   < > 26.1*   < > 26.2*   < > 25.0* 30.0* 29.0* 28.0*  MCV 95.0  --  92.6  --  92.2  --  95.6  --   --   --   --   --   PLT 182  --  233  --  224  --  237  --   --   --   --   --    < > = values in this interval  not displayed.    Medications:    . sodium chloride   Intravenous Once  . amiodarone  200 mg Oral Daily  . vitamin C  500 mg Oral TID  . aspirin  81 mg Oral Daily  . atorvastatin  80 mg Oral q1800  . chlorhexidine gluconate (MEDLINE KIT)  15 mL Mouth Rinse BID  . Chlorhexidine Gluconate Cloth  6 each Topical Q0600  . Chlorhexidine Gluconate Cloth  6 each Topical Q0600  . darbepoetin (ARANESP) injection - NON-DIALYSIS  60 mcg Subcutaneous Q Tue-1800  . feeding supplement (NEPRO CARB STEADY)  237 mL Oral TID BM  . feeding supplement (PRO-STAT SUGAR FREE 64)  60 mL Per Tube BID  . Gerhardt's butt cream   Topical BID  . hydrocortisone   Rectal BID  . insulin aspart  0-24 Units Subcutaneous Q4H  . insulin glargine  30 Units Subcutaneous Daily  . mouth rinse  15 mL Mouth Rinse 10 times per day  . metoCLOPramide (REGLAN) injection  10 mg Intravenous Q6H  . midodrine  15 mg Oral TID WC  . multivitamin  1 tablet Oral QHS  . pantoprazole (PROTONIX) IV  40 mg Intravenous Q24H  . potassium chloride  20 mEq Oral Daily  . sevelamer carbonate  1.6 g Per Tube TID WC  . simethicone  80 mg Oral BID PC  . sodium chloride flush  10-40 mL Intracatheter Q12H  . thiamine  250 mg Per Tube Daily  . vitamin B-12  1,000 mcg Per Tube Daily      Otelia Santee, MD 04/18/2019, 11:40 AM

## 2019-04-18 NOTE — Progress Notes (Addendum)
Patient ID: Jesse Murphy, male   DOB: 08-Feb-1929, 86 y.o.   MRN: [id number]    Advanced Heart Failure Rounding Note  PCP-Cardiologist: Kate Sable, MD   Subjective:    Impella removed 2/23. Intraoperative TEE showed EF ~40%  Remains on TC with speaking valve in place.   Remains on CVVHD. On NE 14 mcg.   Complaining of nausea. Denies SOB.    Objective:   Weight Range: 89.4 kg Body mass index is 29.97 kg/m.   Vital Signs:   Temp:  [97.8 F (36.6 C)-98.4 F (36.9 C)] 98.4 F (36.9 C) (03/03 0335) Pulse Rate:  [46-93] 89 (03/03 0716) Resp:  [13-39] 24 (03/03 0716) BP: (74-131)/(30-96) 110/40 (03/03 0716) SpO2:  [86 %-100 %] 94 % (03/03 0716) FiO2 (%):  [28 %] 28 % (03/03 0716) Weight:  [89.4 kg] 89.4 kg (03/03 0500) Last BM Date: 04/17/19  Weight change: Filed Weights   04/16/19 1155 04/17/19 0500 04/18/19 0500  Weight: 91.7 kg 89.5 kg 89.4 kg    Intake/Output:   Intake/Output Summary (Last 24 hours) at 04/18/2019 0849 Last data filed at 04/18/2019 0800 Gross per 24 hour  Intake 3849.23 ml  Output 5922 ml  Net -2072.77 ml      Physical Exam  No CVP  General:  On Trach collar.  HEENT: normal Neck: Trach ,supple. JVP difficult to assess. Carotids 2+ bilat; no bruits. No lymphadenopathy or thryomegaly appreciated. Cor: PMI nondisplaced. Regular rate & rhythm. No rubs, gallops or murmurs. Lungs: clear but decreased on the left.  Abdomen: soft, nontender, nondistended. No hepatosplenomegaly. No bruits or masses. Good bowel sounds. Extremities: no cyanosis, clubbing, rash, R and LLE 1+ edema. RUE PICC Neuro: alert & orientedx3, cranial nerves grossly intact. moves all 4 extremities w/o difficulty. Affect pleasant   Telemetry   NSR 80s personally reviewed.   Labs    CBC Recent Labs    04/17/19 0450 04/17/19 0753 04/18/19 0356 04/18/19 0402 04/18/19 0559 04/18/19 0756  WBC 9.1  --  9.1  --   --   --   HGB 8.4*   < > 8.0*   < > 8.5* 10.2*  HCT  26.1*   < > 26.2*   < > 25.0* 30.0*  MCV 92.2  --  95.6  --   --   --   PLT 224  --  237  --   --   --    < > = values in this interval not displayed.   Basic Metabolic Panel Recent Labs    04/17/19 0450 04/17/19 0753 04/17/19 1744 04/17/19 1957 04/18/19 0356 04/18/19 0402 04/18/19 0559 04/18/19 0756  NA 132*   < > 137   < > 125*   < > 132* 138  K 3.6   < > 3.9   < > 3.3*   < > 3.4* 3.7  CL 93*   < > 95*   < > 82*   < > 87* 88*  CO2 27   < > 29  --  29  --   --   --   GLUCOSE 271*   < > 135*   < > 446*   < > 251* 213*  BUN 28*   < > 22   < > 20   < > 21 19  CREATININE 2.51*   < > 2.02*   < > 1.73*   < > 1.70* 1.40*  CALCIUM 10.8*   < > 7.2*  --  >  15.0*  --   --   --   MG 1.7  --   --   --  1.8  --   --   --   PHOS 2.9   < > 2.0*  --  1.7*  --   --   --    < > = values in this interval not displayed.   Liver Function Tests Recent Labs    04/17/19 1744 04/18/19 0356  AST  --  16  ALT  --  7  ALKPHOS  --  67  BILITOT  --  0.6  PROT  --  4.6*  ALBUMIN 2.1* 1.8*   No results for input(s): LIPASE, AMYLASE in the last 72 hours. Cardiac Enzymes No results for input(s): CKTOTAL, CKMB, CKMBINDEX, TROPONINI in the last 72 hours.  BNP: BNP (last 3 results) Recent Labs    03/22/19 0401 03/26/19 0626 03/27/19 1255  BNP 340.4* 687.5* 800.4*    ProBNP (last 3 results) No results for input(s): PROBNP in the last 8760 hours.   D-Dimer No results for input(s): DDIMER in the last 72 hours. Hemoglobin A1C No results for input(s): HGBA1C in the last 72 hours. Fasting Lipid Panel No results for input(s): CHOL, HDL, LDLCALC, TRIG, CHOLHDL, LDLDIRECT in the last 72 hours. Thyroid Function Tests No results for input(s): TSH, T4TOTAL, T3FREE, THYROIDAB in the last 72 hours.  Invalid input(s): FREET3  Other results:   Imaging    No results found.   Medications:     Scheduled Medications: . sodium chloride   Intravenous Once  . amiodarone  200 mg Oral Daily  .  vitamin C  500 mg Oral TID  . aspirin  81 mg Oral Daily  . atorvastatin  80 mg Oral q1800  . chlorhexidine gluconate (MEDLINE KIT)  15 mL Mouth Rinse BID  . Chlorhexidine Gluconate Cloth  6 each Topical Q0600  . Chlorhexidine Gluconate Cloth  6 each Topical Q0600  . darbepoetin (ARANESP) injection - NON-DIALYSIS  60 mcg Subcutaneous Q Tue-1800  . feeding supplement (NEPRO CARB STEADY)  237 mL Oral TID BM  . feeding supplement (PRO-STAT SUGAR FREE 64)  60 mL Per Tube BID  . Gerhardt's butt cream   Topical BID  . hydrocortisone   Rectal BID  . insulin aspart  0-24 Units Subcutaneous Q4H  . insulin glargine  30 Units Subcutaneous Daily  . mouth rinse  15 mL Mouth Rinse 10 times per day  . midodrine  15 mg Oral TID WC  . multivitamin  1 tablet Oral QHS  . pantoprazole (PROTONIX) IV  40 mg Intravenous Q24H  . potassium chloride  20 mEq Oral Daily  . sevelamer carbonate  1.6 g Per Tube TID WC  . simethicone  80 mg Oral BID PC  . sodium chloride flush  10-40 mL Intracatheter Q12H  . thiamine  250 mg Per Tube Daily  . vitamin B-12  1,000 mcg Per Tube Daily    Infusions: . sodium chloride    . sodium chloride    . sodium chloride    . amiodarone 30 mg/hr (04/18/19 0837)  . calcium gluconate infusion for CRRT 70 mL/hr at 04/18/19 0600  . feeding supplement (NEPRO CARB STEADY) 60 mL/hr at 04/18/19 0030  . norepinephrine (LEVOPHED) Adult infusion Stopped (04/18/19 0758)  . prismasol B22GK 4/0 300 mL/hr at 04/18/19 0339  . prismasol B22GK 4/0 1,500 mL/hr at 04/18/19 0700  . sodium citrate 2 %/dextrose 2.5% solution 3000 mL 390  mL/hr at 04/18/19 0553    PRN Medications: sodium chloride, sodium chloride, sodium chloride, oxyCODONE **AND** acetaminophen, dextrose, fentaNYL (SUBLIMAZE) injection, heparin, hyoscyamine, levalbuterol, lidocaine (PF), lidocaine-prilocaine, ondansetron (ZOFRAN) IV, pentafluoroprop-tetrafluoroeth, Resource ThickenUp Clear, sodium chloride, sodium chloride flush,  sodium chloride flush    Assessment/Plan   1. Acute systolic HF -> Cardiogenic shock - post-op echo on 03/20/19 EF 25-30% (pre-op 30-35%. In 12/2017 EF normal) - Required Impella support post CABG. Impella removed 2/23 - failed iHD on 3/1. Now back on CVVHD. Tolerating well but requiring NE 14 and midodrine 15 tid to maintain pressure - CVVHD per nephrology  - PICC line clotted. CVP once PICC issues resolved.  - Liberalize salt in diet. + TED hose. May need ab binder or florinef.   2. CAD s/p CABG this admit (LIMA->LAD, SVG->OM, SVG->RCA on 03/15/19) - No chest pain.  - Continue ASA +statin  3. Acute hypoxic respiratory failure with Pseudomonas PNA - s/p trach on 03/27/19 - on TC with PMV.   - completed meropenem for pseudomonas PNA  4. PAF - s/p MAZE and LAA occlusion - NSR in the 80s  - Continue IV amio  - Off systemic heparin with hematuria and melena/ anemia.  - DVT dose enox started on 3/1 toelrating   5. AKI  - due to shock/ATN - Started [facility] 03/29/19 per Nephrology.    6. Hematuria  -Improved. Off CBI. - Urology following  7. Melena -No melena over night.    8. Anemia -Hematuria/melena. -Hgb 8.4 -> 8.2 -> 8.9 -> 8.4->8  9. Debility, severe - continue PT - repeat OOB and in chair today  10. Hyperkalemia - K 3.4  today.   11. UE swelling - u/s negative for DVT.  - Continue wraps.   12. F/E/N - Tube feeds per Cor-trak  13. Left Pleural Effusion  Per Dr Darcey Nora plan for CT    Darrick Grinder, NP  8:49 AM   Agree with above.   Continues to struggle. Remains on NE 14->12 for BP support. On CVVHD volume status improving.Anuric.  Has large left effusion. Hgb stable. No further hematuria. On DVT dose heparin.   General:  Chronically ill appearing. No resp difficulty HEENT: normal + Cor trak Neck: supple. no JVD.  + trach Carotids 2+ bilat; no bruits. No lymphadenopathy or thryomegaly appreciated. Cor: PMI nondisplaced. Regular rate & rhythm. No rubs,  gallops or murmurs. Lungs: coarse. Dull at basese Abdomen: soft, nontender, nondistended. No hepatosplenomegaly. No bruits or masses. Good bowel sounds. Extremities: no cyanosis, clubbing, rash, 1-2+ edema Neuro: awake on TC. Following commands  Remains very tenuous. Anuric. Unable to tolerate iHD due to PAF and hypotension. Now on CVVHD and NE at 12. Will wean as tolerated but have not been successful so far. Continue compression stockings. Liberalize salt. Continue midodrine.   For left CT today.   If unable to tolerate iHD would be facing a comfort care situation.   CRITICAL CARE Performed by: Glori Bickers  Total critical care time: 35 minutes  Critical care time was exclusive of separately billable procedures and treating other patients.  Critical care was necessary to treat or prevent imminent or life-threatening deterioration.  Critical care was time spent personally by me (independent of midlevel providers or residents) on the following activities: development of treatment plan with patient and/or surrogate as well as nursing, discussions with consultants, evaluation of patient's response to treatment, examination of patient, obtaining history from patient or surrogate, ordering and performing treatments and interventions, ordering  and review of laboratory studies, ordering and review of radiographic studies, pulse oximetry and re-evaluation of patient's condition.  Glori Bickers, MD  11:14 AM

## 2019-04-18 NOTE — Progress Notes (Signed)
CRITICAL VALUE ALERT  Critical Value:  Ca 15  Date & Time Notied:  04/18/19 9:09 AM  Provider Notified: Jadene Pierini   Orders Received/Actions taken: Monitor.

## 2019-04-18 NOTE — Progress Notes (Signed)
NAME:  Jesse Murphy, MRN:  [id number], DOB:  07/12/1928, LOS: 56 ADMISSION DATE:  03/13/2019, CONSULTATION DATE:  2/2 REFERRING MD:  Nils Pyle, CHIEF COMPLAINT:  Dyspnea   Brief History   86 y/o male admitted 1/26, underwent CABG and L atrial appendage clipping on 1/29, moved to ICU on 2/1 for intubation.  Past Medical History  A fib, CAD, Prostate cancer, HTN  Significant Hospital Events   1/29 CABG 2/01 to ICU 2/05 Extubated 2/08 Reintubated 2/09 tracheostomy 2/09 Impella, PA catheter placed 2/11 CRRT initiated 2/16 leukocytosis, add vancomycin 2/17 start TC trials 2/19 urology placed 3 way foley, bleeding at rectal tube site 2/21 ATC initiated  2/22 1 unit PRBC 2/23 1 unit PRBC. Impella removed 2/25 ATC trach collar. 3/1 Converted to A fib in HD 3/1 Started CVVHD, net negative 50 cc per hour, on Levo at 14  Consults:  Nephrology CHF team Urology  Procedures:  Rt PICC 2/02 >> Trach 2/09 >> PA catheter 2/09 >> Lt [place] HD cath 2/11 >> L Rad Aline 2/3 >>  Impella 2/10 >> 2/23  Significant Diagnostic Tests:  Echo 1/26 >> EF 30 to 35% Echo 2/09 >> EF 50-55%   Micro Data:  SARS CoV2 PCR 1/26 >> negative Influenza PCR 1/26 >> negative Sputum 2/01 >> oral flora Sputum 2/08 >> Pseudomonas >> pan sensitive  Blood 2/16 >> negative Sputum 2/16 >> Pseudomonas sens to cipro, gent, imepenem Resp 2/20>> PA sens cipro, gent, impipenem BCx2 2/20 >> neg  Antimicrobials:  Rocephin 2/01 >> 2/07 Cefepime 2/08 >> 2/10 Fortaz 2/10 >> 2/16 Vancomycin 2/16 >> 2/19 Meropenem 2/20 >> 2/26  Interim history/subjective:  Tolerating trach collar. Tolerates PMV.  CXR is wet compared to 3/1 Keeps having hypotension requiring NE and AF during HD.  Requires Amio IV and PO Behind on diuresis, so CVVHD started for continuous Fluid removal  Objective   Blood pressure (!) 110/40, pulse 89, temperature 98.4 F (36.9 C), temperature source Oral, resp. rate (!) 24, height 5\' 8"  (1.727  m), weight 89.4 kg, SpO2 94 %. CVP:  [0 mmHg-22 mmHg] 7 mmHg  FiO2 (%):  [28 %] 28 %   Intake/Output Summary (Last 24 hours) at 04/18/2019 0802 Last data filed at 04/18/2019 0700 Gross per 24 hour  Intake 3531.4 ml  Output 5619 ml  Net -2087.6 ml   Filed Weights   04/16/19 1155 04/17/19 0500 04/18/19 0500  Weight: 91.7 kg 89.5 kg 89.4 kg   Physical Exam: General: Frail elderly male who does not feel well HEENT: Tracheostomy with Passy-Muir valve trach collar in place Neuro: Grossly intact CV: Heart sounds are regular PULM: Decreased breath sounds in the bases   Chest x-ray 04/18/2018.  Tracheostomy is well approximated.  Left subclavian hemodialysis catheter well situated.  Right PICC noted to be in place.  The staples from Impella site noted.  Moderate left pleural effusion  Tolerating trach collar GI: soft, bsx4 active  GU: Three-way Foley is in place.  Urine is grossly bloody.  Clots are noted. Extremities: Anasarca all extremities Skin: Impella incision well approximated with staples right chest, sternal wound is well-healed,   Resolved Hospital Problem list   Pseudomonas HCAP s/p completion meropenem  Assessment & Plan:   Acute Hypoxemic Respiratory Failure due to acute pulmonary edema, pneumonia. Small Left Pleural Post-pericardiotomy Effusion  S/p tracheostomy 2/9 CXR 3/2 with bilateral effusions, L>R with bibasilar atelectasis and mild vascular congestion Some desats 3/2  Off vent > 96 hours. Plan  Continue trach collar tolerating Remittent chest x-ray Negative I&O as tolerated Speech therapy is following is tolerating Passy-Muir valve May be able to downsize trach later in the week  Cardiogenic Shock with acute systolic heart failure s/p Impella removal 2/23. Now off inotropes. EF normal per last echo. Atrial fibrillation. Recurrent need for vasopressors is limiting patient's progress and is not a long-term outpatient option.  Persistent impaired  vasoreactivity. Continue vasopressor and midodrine management Continue to pull fluid as tolerated Continue vitamin supplementation Relative adrenal insufficiency ruled out    Persistent Hematuria with retained clots - hematuria clearing Urology is following. 04/18/2019 gross hematuria urine  AKI secondary to Cardiogenic Shock.  Currently on CRRT Plan Continue CRRT Serial metabolic packages Replete electrolytes as needed  Anemia of critical illness. Some flexiseal trauma and hematuria as above -trend CBC  -transfuse for Hgb <7% or significant bleeding  -Holding heparin gtt  DM2 with hyperglycemia, poorly controlled CBG (last 3)  Recent Labs    04/17/19 2322 04/18/19 0330 04/18/19 0755  GLUCAP 137* 153* 208*    Continue sliding scale insulin protocol Adjustments in Lantus per cardiovascular thoracic surgery  Pressure wounds / Reported as Skin Tear 2/15 Continue wound ostomy care interventions   Best practice:  Diet: Dysphagia 1 diet DVT prophylaxis: SCD's GI prophylaxis: PPI Mobility: bed rest Code Status: Partial Code  Disposition: ICU  Labs    CMP Latest Ref Rng & Units 04/18/2019 04/18/2019 04/18/2019  Glucose 70 - 99 mg/dL 251(H) 197(H) 394(H)  BUN 8 - 23 mg/dL 21 19 20   Creatinine 0.61 - 1.24 mg/dL 1.70(H) 1.30(H) 1.60(H)  Sodium 135 - 145 mmol/L 132(L) 138 126(L)  Potassium 3.5 - 5.1 mmol/L 3.4(L) 3.6 3.5  Chloride 98 - 111 mmol/L 87(L) 88(L) 85(L)  CO2 22 - 32 mmol/L - - -  Calcium 8.9 - 10.3 mg/dL - - -  Total Protein 6.5 - 8.1 g/dL - - -  Total Bilirubin 0.3 - 1.2 mg/dL - - -  Alkaline Phos 38 - 126 U/L - - -  AST 15 - 41 U/L - - -  ALT 0 - 44 U/L - - -    CBC Latest Ref Rng & Units 04/18/2019 04/18/2019 04/18/2019  WBC 4.0 - 10.5 K/uL - - -  Hemoglobin 13.0 - 17.0 g/dL 8.5(L) 9.5(L) 7.8(L)  Hematocrit 39.0 - 52.0 % 25.0(L) 28.0(L) 23.0(L)  Platelets 150 - 400 K/uL - - -    ABG    Component Value Date/Time   PHART 7.488 (H) 04/07/2019 0405    PCO2ART 34.4 04/07/2019 0405   PO2ART 101.0 04/07/2019 0405   HCO3 25.9 04/07/2019 0405   TCO2 31 04/18/2019 0559   ACIDBASEDEF 1.0 04/05/2019 0540   O2SAT 67.8 04/14/2019 1253    CBG (last 3)  Recent Labs    04/17/19 2322 04/18/19 0330 04/18/19 0755  GLUCAP 137* 153* 208*   App time 30 min  Richardson Landry Saima Monterroso ACNP Acute Care Nurse Practitioner [place] Please consult Amion 04/18/2019, 8:03 AM

## 2019-04-18 NOTE — Progress Notes (Addendum)
Nutrition Follow up   DOCUMENTATION CODES:   Not applicable  INTERVENTION:  Recommend discontinuation of Renvela as phosphorus is low.   Change tube feeding:  -Vital 1.5 @ 20 ml/hr via [name] -Increase by 10 ml Q8 hours to goal rate of 55 ml/hr (1320 ml) -60 ml Prostat BID -Renal MVI daily   Provides: 2380 kcals, 149 grams protein, 1008 ml free water. Meets 100% of needs.    NUTRITION DIAGNOSIS:   Increased nutrient needs related to post-op healing as evidenced by estimated needs.  Ongoing  GOAL:   Patient will meet greater than or equal to 90% of their needs   Addressed via tube feeding   MONITOR:   Weight trends, Diet advancement, Vent status, Skin, TF tolerance, Labs, I & O's  REASON FOR ASSESSMENT:   Consult Enteral/tube feeding initiation and management  ASSESSMENT:   Patient with PMH significant for CAD s/p PCI, prostate cancer, HTN, CHF, and DM. Presents this admission with chronic a.fib with RVR.   1/28- s/p L heart cath  1/30- s/p CABG x3, MAZE, extubated  2/2- re-intubated  2/5- extubated 2/8- re-intubated  2/10- s/p impella, trach, NGT placed-start trickle feeding 2/11- start CRRT 2/23- removal of impella, [name] dislodged  2/24- CRRT stop 3/1- failed iHD, back on CRRT 3/2- s/p [name], nocturnal feedings started  Remains on ATC, tolerating PMV. May downsize trach later this week. On CRRT. Had episode of vomiting last night, TF held. Remains nauseous. Reglan started. Still not eating much. Meal completions charted as 10-30% for his last four meals.   Pt now hyponatremic and phosphorus continues to trend down. Will change tube feeding to better meet needs. Transition back to 24 hr infusion as intake has not progressed.   Admission weight: 89.3 kg  Current weight: 89.4 kg   I/O: +17,535 ml since 2/17 UOP: 110 ml x 24 hrs  CRRT: 5,627 ml x 24 hrs   Drips: levophed Medications: 500 mg Vitamin C TID, aranesp, SS novolog, lantus, renevela,  rena-vit, 10 mg reglan QID, 20 mEq KCL daily, thiamine, Vit b12 Labs: Phosphorus 1.7 (L) Calcium >15 (higher once corrected) CBG 77-297  Diet Order:   Diet Order            DIET - DYS 1 Room service appropriate? Yes; Fluid consistency: Nectar Thick  Diet effective now              EDUCATION NEEDS:   Not appropriate for education at this time  Skin:  Skin Assessment: Skin Integrity Issues: Skin Integrity Issues:: Stage I, DTI DTI: chest Stage I: vertebral column Incisions: chest, leg  Last BM:  3/2  Height:   Ht Readings from Last 1 Encounters:  03/26/19 5\' 8"  (1.727 m)    Weight:   Wt Readings from Last 1 Encounters:  04/18/19 89.4 kg    Ideal Body Weight:  70 kg  BMI:  Body mass index is 29.97 kg/m.  Estimated Nutritional Needs:   Kcal:  2200-2400 kcal  Protein:  135-155 grams  Fluid:  >/= 2.2 L/day   Mariana Single RD, LDN Clinical Nutrition Pager # - [phone]

## 2019-04-18 NOTE — Progress Notes (Signed)
      [address]       [place] [address]             [phone]      Dr. Prescott Gum speaking with family and patient  Nausea earlier  BP 96/60   Pulse 93   Temp 99.3 F (37.4 C) (Oral)   Resp (!) 21   Ht 5\' 8"  (1.727 m)   Wt 89.4 kg   SpO2 100%   BMI 29.97 kg/m   TC 40% 100% sat  Norepi at 12. Amiodarone at 30   Intake/Output Summary (Last 24 hours) at 04/18/2019 1744 Last data filed at 04/18/2019 1700 Gross per 24 hour  Intake 3694.42 ml  Output 6593 ml  Net -2898.58 ml   On CBI for hematuria For pleur-x in AM  Jesse Murphy C. Roxan Hockey, MD [facility] [phone]

## 2019-04-18 NOTE — Progress Notes (Addendum)
    [address]       [place] [address]             [phone]      8 Days Post-Op Procedure(s) (LRB): REMOVAL OF IMPELLA LEFT VENTRICULAR ASSIST DEVICE WITH INSERTION OF RIGHT FEMORAL ARTERIAL LINE (N/A) Subjective: Having more hematuria, may need irrigation re-initiated Having nausea and vomiting  Objective: Vital signs in last 24 hours: Temp:  [97.8 F (36.6 C)-98.4 F (36.9 C)] 98.4 F (36.9 C) (03/03 0335) Pulse Rate:  [46-93] 89 (03/03 0716) Cardiac Rhythm: Normal sinus rhythm;Bundle branch block (03/03 0400) Resp:  [13-39] 24 (03/03 0716) BP: (74-131)/(30-96) 110/40 (03/03 0716) SpO2:  [86 %-100 %] 94 % (03/03 0716) FiO2 (%):  [28 %] 28 % (03/03 0716) Weight:  [89.4 kg] 89.4 kg (03/03 0500)  Hemodynamic parameters for last 24 hours: CVP:  [0 mmHg-22 mmHg] 5 mmHg  Intake/Output from previous day: 03/02 0701 - 03/03 0700 In: 3760.4 [P.O.:340; I.V.:2935.4; NG/GT:435; IV Piggyback:50] Out: 5737 [Urine:110] Intake/Output this shift: Total I/O In: 158.1 [I.V.:98.1; NG/GT:60] Out: 303 [Urine:15; Other:288]  General appearance: alert, cooperative and no distress Heart: regular rate and rhythm Lungs: dim left base Abdomen: soft, nontender, mild distension Extremities: min edema Wound: incis healing well  Lab Results: Recent Labs    04/17/19 0450 04/17/19 0753 04/18/19 0356 04/18/19 0402 04/18/19 0551 04/18/19 0559  WBC 9.1  --  9.1  --   --   --   HGB 8.4*   < > 8.0*   < > 9.5* 8.5*  HCT 26.1*   < > 26.2*   < > 28.0* 25.0*  PLT 224  --  237  --   --   --    < > = values in this interval not displayed.   BMET:  Recent Labs    04/17/19 1744 04/17/19 1957 04/18/19 0356 04/18/19 0402 04/18/19 0551 04/18/19 0559  NA 137   < > 125*   < > 138 132*  K 3.9   < > 3.3*   < > 3.6 3.4*  CL 95*   < > 82*   < > 88* 87*  CO2 29  --  29  --   --   --   GLUCOSE 135*   < > 446*   < > 197* 251*  BUN 22   < > 20   < > 19 21  CREATININE 2.02*    < > 1.73*   < > 1.30* 1.70*  CALCIUM 7.2*  --  >15.0*  --   --   --    < > = values in this interval not displayed.    PT/INR: No results for input(s): LABPROT, INR in the last 72 hours. ABG    Component Value Date/Time   PHART 7.488 (H) 04/07/2019 0405   HCO3 25.9 04/07/2019 0405   TCO2 31 04/18/2019 0559   ACIDBASEDEF 1.0 04/05/2019 0540   O2SAT 67.8 04/14/2019 1253   CBG (last 3)  Recent Labs    04/17/19 2322 04/18/19 0330 04/18/19 0755  GLUCAP 137* 153* 208*    Meds Scheduled Meds: . sodium chloride   Intravenous Once  . amiodarone  200 mg Oral Daily  . vitamin C  500 mg Oral TID  . aspirin  81 mg Oral Daily  . atorvastatin  80 mg Oral q1800  . chlorhexidine gluconate (MEDLINE KIT)  15 mL Mouth Rinse BID  . Chlorhexidine Gluconate Cloth  6 each   Topical Q0600  . Chlorhexidine Gluconate Cloth  6 each Topical Q0600  . darbepoetin (ARANESP) injection - NON-DIALYSIS  60 mcg Subcutaneous Q Tue-1800  . feeding supplement (NEPRO CARB STEADY)  237 mL Oral TID BM  . feeding supplement (PRO-STAT SUGAR FREE 64)  60 mL Per Tube BID  . Gerhardt's butt cream   Topical BID  . heparin injection (subcutaneous)  5,000 Units Subcutaneous Q12H  . hydrocortisone   Rectal BID  . insulin aspart  0-24 Units Subcutaneous Q4H  . insulin glargine  30 Units Subcutaneous Daily  . mouth rinse  15 mL Mouth Rinse 10 times per day  . midodrine  15 mg Oral TID WC  . multivitamin  1 tablet Oral QHS  . pantoprazole (PROTONIX) IV  40 mg Intravenous Q24H  . potassium chloride  20 mEq Oral Daily  . sevelamer carbonate  1.6 g Per Tube TID WC  . simethicone  80 mg Oral BID PC  . sodium chloride flush  10-40 mL Intracatheter Q12H  . thiamine  250 mg Per Tube Daily  . vitamin B-12  1,000 mcg Per Tube Daily   Continuous Infusions: . sodium chloride    . sodium chloride    . sodium chloride    . amiodarone 30 mg/hr (04/18/19 0837)  . calcium gluconate infusion for CRRT 70 mL/hr at 04/18/19 0600  .  feeding supplement (NEPRO CARB STEADY) 60 mL/hr at 04/18/19 0030  . norepinephrine (LEVOPHED) Adult infusion Stopped (04/18/19 0758)  . prismasol B22GK 4/0 300 mL/hr at 04/18/19 0339  . prismasol B22GK 4/0 1,500 mL/hr at 04/18/19 0700  . sodium citrate 2 %/dextrose 2.5% solution 3000 mL 390 mL/hr at 04/18/19 0553   PRN Meds:.sodium chloride, sodium chloride, sodium chloride, oxyCODONE **AND** acetaminophen, dextrose, fentaNYL (SUBLIMAZE) injection, heparin, hyoscyamine, levalbuterol, lidocaine (PF), lidocaine-prilocaine, ondansetron (ZOFRAN) IV, pentafluoroprop-tetrafluoroeth, Resource ThickenUp Clear, sodium chloride, sodium chloride flush, sodium chloride flush  Xrays DG Chest Port 1 View  Result Date: 04/17/2019 CLINICAL DATA:  Tracheostomy, recent CABG EXAM: PORTABLE CHEST 1 VIEW COMPARISON:  04/14/2019 FINDINGS: Tracheostomy, right PICC line and left Vas-Cath remain in place, unchanged. Prior CABG. Bilateral pleural effusions, left greater than right. Heart is borderline in size with vascular congestion and bibasilar atelectasis. Findings similar to prior study. IMPRESSION: Continued bilateral effusions, left greater than right with bibasilar atelectasis and mild vascular congestion. No real change. Electronically Signed   By: Rolm Baptise M.D.   On: 04/17/2019 08:51    Assessment/Plan: S/P Procedure(s) (LRB): REMOVAL OF IMPELLA LEFT VENTRICULAR ASSIST DEVICE WITH INSERTION OF RIGHT FEMORAL ARTERIAL LINE (N/A)  1 fairly stable, N/V- will add reglan and low dose phenargan. Hold TF's for now 2 conts CRRT, nephrology management of renal issue- also re-start bladder irrigation for worsening hematuria . H/H 10/30 3 some increased O2 requirement, may need pleurx for left effusion Management as per PCCM  4 sinus rhythm on amio gtt 5 HF issues as per team, on norepi  LOS: 35 days    [facility] 04/18/2019 Pager [phone]  Patient back on CRT with goals of removing some  fluid. Uremia adequately treated Patient complains of upper abdominal fullness, difficulty getting a deep breath and nausea. Chest x-ray today shows increasing left pleural effusion which is probably related to his symptoms. Recurrent hematuria from bladder bleeding-we will stop his prophylactic dose heparin and resume bladder irrigation We will plan on draining left pleural effusion with Pleurx catheter placed in OR tomorrow which will give him  a better long-term treatment as the effusion will probably recur.  Drainage of the effusion will hopefully improve his cardiopulmonary status and hopefully reduce his norepinephrine requirement.  This will be done under local anesthesia.  patient examined and medical record reviewed,agree with above note.  Van Trigt III 04/18/2019   

## 2019-04-18 NOTE — Progress Notes (Signed)
Started patient on continuous bladder irrigation per verbal order from DR. Prescott Gum

## 2019-04-19 ENCOUNTER — Encounter (HOSPITAL_COMMUNITY)
Admission: EM | Disposition: A | Discharge status: Rehabilitation Facility | Payer: Self-pay | Source: Home / Self Care | Attending: Cardiothoracic Surgery

## 2019-04-19 ENCOUNTER — Inpatient Hospital Stay (HOSPITAL_COMMUNITY): Payer: Medicare Other

## 2019-04-19 LAB — POCT I-STAT, CHEM 8
BUN: 17 mg/dL (ref 8–23)
BUN: 18 mg/dL (ref 8–23)
BUN: 18 mg/dL (ref 8–23)
BUN: 19 mg/dL (ref 8–23)
BUN: 19 mg/dL (ref 8–23)
BUN: 19 mg/dL (ref 8–23)
BUN: 19 mg/dL (ref 8–23)
BUN: 20 mg/dL (ref 8–23)
BUN: 20 mg/dL (ref 8–23)
BUN: 21 mg/dL (ref 8–23)
BUN: 21 mg/dL (ref 8–23)
BUN: 21 mg/dL (ref 8–23)
BUN: 21 mg/dL (ref 8–23)
BUN: 22 mg/dL (ref 8–23)
BUN: 22 mg/dL (ref 8–23)
BUN: 22 mg/dL (ref 8–23)
BUN: 22 mg/dL (ref 8–23)
BUN: 23 mg/dL (ref 8–23)
BUN: 24 mg/dL — ABNORMAL HIGH (ref 8–23)
BUN: 25 mg/dL — ABNORMAL HIGH (ref 8–23)
Calcium, Ion: 0.31 mmol/L — CL (ref 1.15–1.40)
Calcium, Ion: 0.31 mmol/L — CL (ref 1.15–1.40)
Calcium, Ion: 0.32 mmol/L — CL (ref 1.15–1.40)
Calcium, Ion: 0.39 mmol/L — CL (ref 1.15–1.40)
Calcium, Ion: 0.4 mmol/L — CL (ref 1.15–1.40)
Calcium, Ion: 0.41 mmol/L — CL (ref 1.15–1.40)
Calcium, Ion: 0.42 mmol/L — CL (ref 1.15–1.40)
Calcium, Ion: 0.44 mmol/L — CL (ref 1.15–1.40)
Calcium, Ion: 0.46 mmol/L — CL (ref 1.15–1.40)
Calcium, Ion: 0.89 mmol/L — CL (ref 1.15–1.40)
Calcium, Ion: 0.94 mmol/L — ABNORMAL LOW (ref 1.15–1.40)
Calcium, Ion: 0.95 mmol/L — ABNORMAL LOW (ref 1.15–1.40)
Calcium, Ion: 0.97 mmol/L — ABNORMAL LOW (ref 1.15–1.40)
Calcium, Ion: 1 mmol/L — ABNORMAL LOW (ref 1.15–1.40)
Calcium, Ion: 1.1 mmol/L — ABNORMAL LOW (ref 1.15–1.40)
Calcium, Ion: 1.17 mmol/L (ref 1.15–1.40)
Calcium, Ion: 1.73 mmol/L (ref 1.15–1.40)
Calcium, Ion: 1.88 mmol/L (ref 1.15–1.40)
Calcium, Ion: 2.07 mmol/L (ref 1.15–1.40)
Calcium, Ion: 2.13 mmol/L (ref 1.15–1.40)
Chloride: 104 mmol/L (ref 98–111)
Chloride: 79 mmol/L — ABNORMAL LOW (ref 98–111)
Chloride: 79 mmol/L — ABNORMAL LOW (ref 98–111)
Chloride: 80 mmol/L — ABNORMAL LOW (ref 98–111)
Chloride: 80 mmol/L — ABNORMAL LOW (ref 98–111)
Chloride: 81 mmol/L — ABNORMAL LOW (ref 98–111)
Chloride: 81 mmol/L — ABNORMAL LOW (ref 98–111)
Chloride: 81 mmol/L — ABNORMAL LOW (ref 98–111)
Chloride: 82 mmol/L — ABNORMAL LOW (ref 98–111)
Chloride: 83 mmol/L — ABNORMAL LOW (ref 98–111)
Chloride: 84 mmol/L — ABNORMAL LOW (ref 98–111)
Chloride: 84 mmol/L — ABNORMAL LOW (ref 98–111)
Chloride: 84 mmol/L — ABNORMAL LOW (ref 98–111)
Chloride: 84 mmol/L — ABNORMAL LOW (ref 98–111)
Chloride: 85 mmol/L — ABNORMAL LOW (ref 98–111)
Chloride: 85 mmol/L — ABNORMAL LOW (ref 98–111)
Chloride: 85 mmol/L — ABNORMAL LOW (ref 98–111)
Chloride: 86 mmol/L — ABNORMAL LOW (ref 98–111)
Chloride: 88 mmol/L — ABNORMAL LOW (ref 98–111)
Chloride: 88 mmol/L — ABNORMAL LOW (ref 98–111)
Creatinine, Ser: 1.3 mg/dL — ABNORMAL HIGH (ref 0.61–1.24)
Creatinine, Ser: 1.3 mg/dL — ABNORMAL HIGH (ref 0.61–1.24)
Creatinine, Ser: 1.3 mg/dL — ABNORMAL HIGH (ref 0.61–1.24)
Creatinine, Ser: 1.3 mg/dL — ABNORMAL HIGH (ref 0.61–1.24)
Creatinine, Ser: 1.3 mg/dL — ABNORMAL HIGH (ref 0.61–1.24)
Creatinine, Ser: 1.3 mg/dL — ABNORMAL HIGH (ref 0.61–1.24)
Creatinine, Ser: 1.4 mg/dL — ABNORMAL HIGH (ref 0.61–1.24)
Creatinine, Ser: 1.4 mg/dL — ABNORMAL HIGH (ref 0.61–1.24)
Creatinine, Ser: 1.4 mg/dL — ABNORMAL HIGH (ref 0.61–1.24)
Creatinine, Ser: 1.4 mg/dL — ABNORMAL HIGH (ref 0.61–1.24)
Creatinine, Ser: 1.5 mg/dL — ABNORMAL HIGH (ref 0.61–1.24)
Creatinine, Ser: 1.5 mg/dL — ABNORMAL HIGH (ref 0.61–1.24)
Creatinine, Ser: 1.5 mg/dL — ABNORMAL HIGH (ref 0.61–1.24)
Creatinine, Ser: 1.5 mg/dL — ABNORMAL HIGH (ref 0.61–1.24)
Creatinine, Ser: 1.5 mg/dL — ABNORMAL HIGH (ref 0.61–1.24)
Creatinine, Ser: 1.5 mg/dL — ABNORMAL HIGH (ref 0.61–1.24)
Creatinine, Ser: 1.6 mg/dL — ABNORMAL HIGH (ref 0.61–1.24)
Creatinine, Ser: 1.6 mg/dL — ABNORMAL HIGH (ref 0.61–1.24)
Creatinine, Ser: 1.6 mg/dL — ABNORMAL HIGH (ref 0.61–1.24)
Creatinine, Ser: 1.7 mg/dL — ABNORMAL HIGH (ref 0.61–1.24)
Glucose, Bld: 132 mg/dL — ABNORMAL HIGH (ref 70–99)
Glucose, Bld: 140 mg/dL — ABNORMAL HIGH (ref 70–99)
Glucose, Bld: 147 mg/dL — ABNORMAL HIGH (ref 70–99)
Glucose, Bld: 150 mg/dL — ABNORMAL HIGH (ref 70–99)
Glucose, Bld: 165 mg/dL — ABNORMAL HIGH (ref 70–99)
Glucose, Bld: 167 mg/dL — ABNORMAL HIGH (ref 70–99)
Glucose, Bld: 173 mg/dL — ABNORMAL HIGH (ref 70–99)
Glucose, Bld: 175 mg/dL — ABNORMAL HIGH (ref 70–99)
Glucose, Bld: 179 mg/dL — ABNORMAL HIGH (ref 70–99)
Glucose, Bld: 204 mg/dL — ABNORMAL HIGH (ref 70–99)
Glucose, Bld: 221 mg/dL — ABNORMAL HIGH (ref 70–99)
Glucose, Bld: 225 mg/dL — ABNORMAL HIGH (ref 70–99)
Glucose, Bld: 245 mg/dL — ABNORMAL HIGH (ref 70–99)
Glucose, Bld: 249 mg/dL — ABNORMAL HIGH (ref 70–99)
Glucose, Bld: 257 mg/dL — ABNORMAL HIGH (ref 70–99)
Glucose, Bld: 285 mg/dL — ABNORMAL HIGH (ref 70–99)
Glucose, Bld: 286 mg/dL — ABNORMAL HIGH (ref 70–99)
Glucose, Bld: 375 mg/dL — ABNORMAL HIGH (ref 70–99)
Glucose, Bld: 384 mg/dL — ABNORMAL HIGH (ref 70–99)
Glucose, Bld: 98 mg/dL (ref 70–99)
HCT: 26 % — ABNORMAL LOW (ref 39.0–52.0)
HCT: 26 % — ABNORMAL LOW (ref 39.0–52.0)
HCT: 27 % — ABNORMAL LOW (ref 39.0–52.0)
HCT: 27 % — ABNORMAL LOW (ref 39.0–52.0)
HCT: 27 % — ABNORMAL LOW (ref 39.0–52.0)
HCT: 27 % — ABNORMAL LOW (ref 39.0–52.0)
HCT: 27 % — ABNORMAL LOW (ref 39.0–52.0)
HCT: 28 % — ABNORMAL LOW (ref 39.0–52.0)
HCT: 28 % — ABNORMAL LOW (ref 39.0–52.0)
HCT: 28 % — ABNORMAL LOW (ref 39.0–52.0)
HCT: 29 % — ABNORMAL LOW (ref 39.0–52.0)
HCT: 29 % — ABNORMAL LOW (ref 39.0–52.0)
HCT: 29 % — ABNORMAL LOW (ref 39.0–52.0)
HCT: 31 % — ABNORMAL LOW (ref 39.0–52.0)
HCT: 31 % — ABNORMAL LOW (ref 39.0–52.0)
HCT: 31 % — ABNORMAL LOW (ref 39.0–52.0)
HCT: 31 % — ABNORMAL LOW (ref 39.0–52.0)
HCT: 32 % — ABNORMAL LOW (ref 39.0–52.0)
HCT: 33 % — ABNORMAL LOW (ref 39.0–52.0)
HCT: 33 % — ABNORMAL LOW (ref 39.0–52.0)
Hemoglobin: 10.5 g/dL — ABNORMAL LOW (ref 13.0–17.0)
Hemoglobin: 10.5 g/dL — ABNORMAL LOW (ref 13.0–17.0)
Hemoglobin: 10.5 g/dL — ABNORMAL LOW (ref 13.0–17.0)
Hemoglobin: 10.5 g/dL — ABNORMAL LOW (ref 13.0–17.0)
Hemoglobin: 10.9 g/dL — ABNORMAL LOW (ref 13.0–17.0)
Hemoglobin: 11.2 g/dL — ABNORMAL LOW (ref 13.0–17.0)
Hemoglobin: 11.2 g/dL — ABNORMAL LOW (ref 13.0–17.0)
Hemoglobin: 8.8 g/dL — ABNORMAL LOW (ref 13.0–17.0)
Hemoglobin: 8.8 g/dL — ABNORMAL LOW (ref 13.0–17.0)
Hemoglobin: 9.2 g/dL — ABNORMAL LOW (ref 13.0–17.0)
Hemoglobin: 9.2 g/dL — ABNORMAL LOW (ref 13.0–17.0)
Hemoglobin: 9.2 g/dL — ABNORMAL LOW (ref 13.0–17.0)
Hemoglobin: 9.2 g/dL — ABNORMAL LOW (ref 13.0–17.0)
Hemoglobin: 9.2 g/dL — ABNORMAL LOW (ref 13.0–17.0)
Hemoglobin: 9.5 g/dL — ABNORMAL LOW (ref 13.0–17.0)
Hemoglobin: 9.5 g/dL — ABNORMAL LOW (ref 13.0–17.0)
Hemoglobin: 9.5 g/dL — ABNORMAL LOW (ref 13.0–17.0)
Hemoglobin: 9.9 g/dL — ABNORMAL LOW (ref 13.0–17.0)
Hemoglobin: 9.9 g/dL — ABNORMAL LOW (ref 13.0–17.0)
Hemoglobin: 9.9 g/dL — ABNORMAL LOW (ref 13.0–17.0)
Potassium: 3.4 mmol/L — ABNORMAL LOW (ref 3.5–5.1)
Potassium: 3.4 mmol/L — ABNORMAL LOW (ref 3.5–5.1)
Potassium: 3.5 mmol/L (ref 3.5–5.1)
Potassium: 3.6 mmol/L (ref 3.5–5.1)
Potassium: 3.6 mmol/L (ref 3.5–5.1)
Potassium: 3.6 mmol/L (ref 3.5–5.1)
Potassium: 3.6 mmol/L (ref 3.5–5.1)
Potassium: 3.6 mmol/L (ref 3.5–5.1)
Potassium: 3.6 mmol/L (ref 3.5–5.1)
Potassium: 3.7 mmol/L (ref 3.5–5.1)
Potassium: 3.7 mmol/L (ref 3.5–5.1)
Potassium: 3.7 mmol/L (ref 3.5–5.1)
Potassium: 3.8 mmol/L (ref 3.5–5.1)
Potassium: 3.8 mmol/L (ref 3.5–5.1)
Potassium: 3.9 mmol/L (ref 3.5–5.1)
Potassium: 3.9 mmol/L (ref 3.5–5.1)
Potassium: 3.9 mmol/L (ref 3.5–5.1)
Potassium: 4 mmol/L (ref 3.5–5.1)
Potassium: 4.1 mmol/L (ref 3.5–5.1)
Potassium: 4.1 mmol/L (ref 3.5–5.1)
Sodium: 127 mmol/L — ABNORMAL LOW (ref 135–145)
Sodium: 127 mmol/L — ABNORMAL LOW (ref 135–145)
Sodium: 130 mmol/L — ABNORMAL LOW (ref 135–145)
Sodium: 132 mmol/L — ABNORMAL LOW (ref 135–145)
Sodium: 135 mmol/L (ref 135–145)
Sodium: 135 mmol/L (ref 135–145)
Sodium: 136 mmol/L (ref 135–145)
Sodium: 136 mmol/L (ref 135–145)
Sodium: 137 mmol/L (ref 135–145)
Sodium: 137 mmol/L (ref 135–145)
Sodium: 137 mmol/L (ref 135–145)
Sodium: 137 mmol/L (ref 135–145)
Sodium: 138 mmol/L (ref 135–145)
Sodium: 138 mmol/L (ref 135–145)
Sodium: 138 mmol/L (ref 135–145)
Sodium: 138 mmol/L (ref 135–145)
Sodium: 138 mmol/L (ref 135–145)
Sodium: 138 mmol/L (ref 135–145)
Sodium: 138 mmol/L (ref 135–145)
Sodium: 139 mmol/L (ref 135–145)
TCO2: 34 mmol/L — ABNORMAL HIGH (ref 22–32)
TCO2: 36 mmol/L — ABNORMAL HIGH (ref 22–32)
TCO2: 36 mmol/L — ABNORMAL HIGH (ref 22–32)
TCO2: 36 mmol/L — ABNORMAL HIGH (ref 22–32)
TCO2: 36 mmol/L — ABNORMAL HIGH (ref 22–32)
TCO2: 36 mmol/L — ABNORMAL HIGH (ref 22–32)
TCO2: 37 mmol/L — ABNORMAL HIGH (ref 22–32)
TCO2: 37 mmol/L — ABNORMAL HIGH (ref 22–32)
TCO2: 38 mmol/L — ABNORMAL HIGH (ref 22–32)
TCO2: 38 mmol/L — ABNORMAL HIGH (ref 22–32)
TCO2: 38 mmol/L — ABNORMAL HIGH (ref 22–32)
TCO2: 39 mmol/L — ABNORMAL HIGH (ref 22–32)
TCO2: 39 mmol/L — ABNORMAL HIGH (ref 22–32)
TCO2: 39 mmol/L — ABNORMAL HIGH (ref 22–32)
TCO2: 39 mmol/L — ABNORMAL HIGH (ref 22–32)
TCO2: 40 mmol/L — ABNORMAL HIGH (ref 22–32)
TCO2: 40 mmol/L — ABNORMAL HIGH (ref 22–32)
TCO2: 41 mmol/L — ABNORMAL HIGH (ref 22–32)
TCO2: 41 mmol/L — ABNORMAL HIGH (ref 22–32)
TCO2: 42 mmol/L — ABNORMAL HIGH (ref 22–32)

## 2019-04-19 LAB — GLUCOSE, CAPILLARY
Glucose-Capillary: 126 mg/dL — ABNORMAL HIGH (ref 70–99)
Glucose-Capillary: 127 mg/dL — ABNORMAL HIGH (ref 70–99)
Glucose-Capillary: 132 mg/dL — ABNORMAL HIGH (ref 70–99)
Glucose-Capillary: 133 mg/dL — ABNORMAL HIGH (ref 70–99)
Glucose-Capillary: 156 mg/dL — ABNORMAL HIGH (ref 70–99)
Glucose-Capillary: 170 mg/dL — ABNORMAL HIGH (ref 70–99)

## 2019-04-19 LAB — CBC WITH DIFFERENTIAL/PLATELET
Abs Immature Granulocytes: 0.06 10*3/uL (ref 0.00–0.07)
Basophils Absolute: 0.1 10*3/uL (ref 0.0–0.1)
Basophils Relative: 0 %
Eosinophils Absolute: 0.2 10*3/uL (ref 0.0–0.5)
Eosinophils Relative: 1 %
HCT: 28.1 % — ABNORMAL LOW (ref 39.0–52.0)
Hemoglobin: 8.5 g/dL — ABNORMAL LOW (ref 13.0–17.0)
Immature Granulocytes: 1 %
Lymphocytes Relative: 7 %
Lymphs Abs: 0.8 10*3/uL (ref 0.7–4.0)
MCH: 29.2 pg (ref 26.0–34.0)
MCHC: 30.2 g/dL (ref 30.0–36.0)
MCV: 96.6 fL (ref 80.0–100.0)
Monocytes Absolute: 1.5 10*3/uL — ABNORMAL HIGH (ref 0.1–1.0)
Monocytes Relative: 13 %
Neutro Abs: 9.2 10*3/uL — ABNORMAL HIGH (ref 1.7–7.7)
Neutrophils Relative %: 78 %
Platelets: 255 10*3/uL (ref 150–400)
RBC: 2.91 MIL/uL — ABNORMAL LOW (ref 4.22–5.81)
RDW: 15.8 % — ABNORMAL HIGH (ref 11.5–15.5)
WBC: 11.8 10*3/uL — ABNORMAL HIGH (ref 4.0–10.5)
nRBC: 0 % (ref 0.0–0.2)

## 2019-04-19 LAB — RENAL FUNCTION PANEL
Albumin: 2.1 g/dL — ABNORMAL LOW (ref 3.5–5.0)
Albumin: 2.2 g/dL — ABNORMAL LOW (ref 3.5–5.0)
Anion gap: 12 (ref 5–15)
Anion gap: 15 (ref 5–15)
BUN: 21 mg/dL (ref 8–23)
BUN: 24 mg/dL — ABNORMAL HIGH (ref 8–23)
CO2: 37 mmol/L — ABNORMAL HIGH (ref 22–32)
CO2: 38 mmol/L — ABNORMAL HIGH (ref 22–32)
Calcium: 9.5 mg/dL (ref 8.9–10.3)
Calcium: 14.1 mg/dL (ref 8.9–10.3)
Chloride: 90 mmol/L — ABNORMAL LOW (ref 98–111)
Chloride: 82 mmol/L — ABNORMAL LOW (ref 98–111)
Creatinine, Ser: 1.63 mg/dL — ABNORMAL HIGH (ref 0.61–1.24)
Creatinine, Ser: 1.65 mg/dL — ABNORMAL HIGH (ref 0.61–1.24)
GFR calc Af Amer: 43 mL/min — ABNORMAL LOW (ref >60)
GFR calc Af Amer: 42 mL/min — ABNORMAL LOW (ref >60)
GFR calc non Af Amer: 37 mL/min — ABNORMAL LOW (ref >60)
GFR calc non Af Amer: 37 mL/min — ABNORMAL LOW (ref >60)
Glucose, Bld: 127 mg/dL — ABNORMAL HIGH (ref 70–99)
Glucose, Bld: 286 mg/dL — ABNORMAL HIGH (ref 70–99)
Phosphorus: 2 mg/dL — ABNORMAL LOW (ref 2.5–4.6)
Phosphorus: 1.4 mg/dL — ABNORMAL LOW (ref 2.5–4.6)
Potassium: 4 mmol/L (ref 3.5–5.1)
Potassium: 3.5 mmol/L (ref 3.5–5.1)
Sodium: 139 mmol/L (ref 135–145)
Sodium: 135 mmol/L (ref 135–145)

## 2019-04-19 LAB — MAGNESIUM: Magnesium: 1.9 mg/dL (ref 1.7–2.4)

## 2019-04-19 LAB — PREPARE RBC (CROSSMATCH)

## 2019-04-19 LAB — CALCIUM, IONIZED: Calcium, Ionized, Serum: 8.8 mg/dL — ABNORMAL HIGH (ref 4.5–5.6)

## 2019-04-19 SURGERY — INSERTION, PLEURAL DRAINAGE CATHETER
Anesthesia: Monitor Anesthesia Care | Site: Chest | Laterality: Left

## 2019-04-19 MED ORDER — ALBUMIN HUMAN 25 % IV SOLN
12.5000 g | Freq: Four times a day (QID) | INTRAVENOUS | Status: AC
  Administered 2019-04-19 (×3): 12.5 g via INTRAVENOUS
  Filled 2019-04-19 (×3): qty 50

## 2019-04-19 MED ORDER — POTASSIUM CHLORIDE 10 MEQ/50ML IV SOLN
10.0000 meq | Freq: Once | INTRAVENOUS | Status: AC
  Administered 2019-04-19: 10 meq via INTRAVENOUS
  Filled 2019-04-19: qty 50

## 2019-04-19 MED ORDER — AMIODARONE IV BOLUS ONLY 150 MG/100ML
150.0000 mg | Freq: Once | INTRAVENOUS | Status: AC
  Administered 2019-04-19: 150 mg via INTRAVENOUS

## 2019-04-19 NOTE — Progress Notes (Signed)
  Speech Language Pathology Treatment: Dysphagia;Ronks Speaking valve  Patient Details Name: Jesse Murphy MRN: [id number] DOB: 1929-02-08 Today's Date: 04/19/2019 Time: 0177-9390 SLP Time Calculation (min) (ACUTE ONLY): 28 min  Assessment / Plan / Recommendation Clinical Impression  PMV: Pt alert, cooperative; son at bedside. RN assisted with repositioning to optimize communication and swallowing. PMV placed.  Pt's son was taught how to place/remove PMV with stabilization of outer cannula. Pt achieved excellent quality phonation with good volume and intelligibility with no cues needed for speech clarity. VS remained stable throughout session. Recommend pt wear valve all waking hours; remove when napping and at night for sleep.  Swallowing: Son placed dentures.  Pt was provided with trials of chopped fruit, which he eagerly consumed with adequate mastication, good oral clearance (no residue), no s/s of aspiration. Requires total assist for feeding, no cues needed today for swallowing.  Pt verbalized his wishes to progress from pureed diet.  Recommend advancing solids to Dysphagia 2; continue nectar thick liquids for now. Pt smiling, interactive, pleased to have diet progressed.  SLP will continue to follow.   HPI HPI: 86 y.o. male with PMH: h/o CAD s/p CABG and clipping of left atrial appendage, who presented with SOB on 2/1. He became hypoxic on 2/2 with pulmonary edema and needed to be intubated on 2/2 and extubated 2/5. Reintubated 2/8.  Pt underwent impella placement and tracheostomy on 2/9. Pt started on CRRT 2/11. ATC trials on 2/17. Impella removal 2/23.      SLP Plan  Continue with current plan of care       Recommendations  Diet recommendations: Dysphagia 2 (fine chop);Nectar-thick liquid Liquids provided via: Cup;Straw Medication Administration: Crushed with puree Supervision: Full supervision/cueing for compensatory strategies;Staff to assist with self feeding Compensations:  Slow rate;Small sips/bites;Follow solids with liquid;Effortful swallow Postural Changes and/or Swallow Maneuvers: Seated upright 90 degrees      Patient may use Passy-Muir Speech Valve: During all waking hours (remove during sleep);During all therapies with supervision;During PO intake/meals PMSV Supervision: Intermittent MD: Please consider changing trach tube to : Cuffless         Oral Care Recommendations: Oral care BID Follow up Recommendations: Skilled Nursing facility SLP Visit Diagnosis: Dysphagia, oropharyngeal phase (R13.12) Plan: Continue with current plan of care       Harry Shuck L. Tivis Ringer, [place] Office number [phone] Pager [phone]                 Juan Quam Laurice 04/19/2019, 12:01 PM

## 2019-04-19 NOTE — Progress Notes (Signed)
Patient downsized from a #6 shiley cuffed to a #4 uncuffed. Patient tolerated trach change fairly well. RT removed passy muir valve due to irritation from trach change.

## 2019-04-19 NOTE — Progress Notes (Signed)
Physical Therapy Treatment Patient Details Name: Jesse Murphy MRN: [id number] DOB: Jun 18, 1928 Today's Date: 04/19/2019    History of Present Illness 86 y.o. male with PMH: h/o CAD s/p CABG and clipping of left atrial appendage, who presented with SOB on 2/1. He became hypoxic on 2/2 with pulmonary edema and needed to be intubated on 2/2 and extubated 2/5. Pt underwent impella placement and tracheostomy on 2/9. Pt started on CRRT 2/11. Impella removal and tracheostomy 2/23. CRRT restarted 3/1 as pt could not tolerate HD.    PT Comments    Pt remains limited by dizziness during session, but demonstrates significant motivation to participate with PT. Pt performs HEP well with PT cueing, demonstrating improved LE strength. Pt reports continued performance of HEP with son's assistance. Pt will benefit from sitting in bed in chair mode for multiple hours each day, as well as attempts at sitting edge of bed daily to reduce orthostatic hypotension with mobility.   Follow Up Recommendations  SNF;Supervision/Assistance - 24 hour     Equipment Recommendations  (defer to post-acute setting)    Recommendations for Other Services       Precautions / Restrictions Precautions Precautions: Fall;Sternal Precaution Booklet Issued: No Precaution Comments: CRRT, NG tube Restrictions Weight Bearing Restrictions: No Other Position/Activity Restrictions: sternal precautions    Mobility  Bed Mobility Overal bed mobility: Needs Assistance Bed Mobility: Rolling;Supine to Sit;Sit to Supine Rolling: Mod assist   Supine to sit: Max assist;+2 for physical assistance Sit to supine: Max assist;+2 for physical assistance      Transfers                    Ambulation/Gait                 Stairs             Wheelchair Mobility    Modified Rankin (Stroke Patients Only)       Balance Overall balance assessment: Needs assistance Sitting-balance support: Bilateral upper  extremity supported;Feet unsupported Sitting balance-Leahy Scale: Fair Sitting balance - Comments: minG with BUE support of bed                                    Cognition Arousal/Alertness: Awake/alert Behavior During Therapy: WFL for tasks assessed/performed Overall Cognitive Status: Within Functional Limits for tasks assessed                                        Exercises General Exercises - Lower Extremity Ankle Circles/Pumps: AROM;Both;20 reps Long Arc Quad: AROM;Both;10 reps;Seated Heel Slides: AROM;Both;10 reps Hip ABduction/ADduction: AROM;Both;10 reps Straight Leg Raises: AAROM;Both;10 reps Other Exercises Other Exercises: SLR with hip abduction/adduction 5 reps AAROM    General Comments General comments (skin integrity, edema, etc.): pt on trach collar, continuous bladder irrigation and CRRT, RN managing lines      Pertinent Vitals/Pain Pain Assessment: Faces Faces Pain Scale: Hurts a little bit Pain Location: generalized Pain Descriptors / Indicators: Grimacing Pain Intervention(s): Limited activity within patient's tolerance    Home Living                      Prior Function            PT Goals (current goals can now be found in the care plan section)  Acute Rehab PT Goals Patient Stated Goal: home Progress towards PT goals: Not progressing toward goals - comment(limited by dizziness)    Frequency    Min 2X/week      PT Plan Current plan remains appropriate    Co-evaluation              AM-PAC PT "6 Clicks" Mobility   Outcome Measure  Help needed turning from your back to your side while in a flat bed without using bedrails?: A Lot Help needed moving from lying on your back to sitting on the side of a flat bed without using bedrails?: Total Help needed moving to and from a bed to a chair (including a wheelchair)?: Total Help needed standing up from a chair using your arms (e.g., wheelchair or  bedside chair)?: Total Help needed to walk in hospital room?: Total Help needed climbing 3-5 steps with a railing? : Total 6 Click Score: 7    End of Session Equipment Utilized During Treatment: Oxygen Activity Tolerance: Treatment limited secondary to medical complications (Comment)(dizziness) Patient left: in bed;with call bell/phone within reach;with nursing/sitter in room Nurse Communication: Mobility status PT Visit Diagnosis: Other abnormalities of gait and mobility (R26.89);Muscle weakness (generalized) (M62.81)     Time: 3128-1188 PT Time Calculation (min) (ACUTE ONLY): 24 min  Charges:  $Therapeutic Exercise: 8-22 mins $Therapeutic Activity: 8-22 mins                     Zenaida Niece, PT, DPT Acute Rehabilitation Pager: [phone]    Zenaida Niece 04/19/2019, 5:08 PM

## 2019-04-19 NOTE — Progress Notes (Signed)
9 Days Post-Op Procedure(s) (LRB): REMOVAL OF IMPELLA LEFT VENTRICULAR ASSIST DEVICE WITH INSERTION OF RIGHT FEMORAL ARTERIAL LINE (N/A) Subjective: Patient alert and responsive, communicates with PM valve Diet advanced to D-2 CXR today with only minimal l pleural effusion Making some urine now but difficult to accurate measure due to bladder irrigation needed for bladder clots Objective: Vital signs in last 24 hours: Temp:  [97.6 F (36.4 C)-99.3 F (37.4 C)] 97.9 F (36.6 C) (03/04 1500) Pulse Rate:  [65-113] 65 (03/04 1600) Cardiac Rhythm: Normal sinus rhythm (03/04 1600) Resp:  [11-33] 26 (03/04 1600) BP: (66-141)/(21-86) 124/31 (03/04 1600) SpO2:  [84 %-100 %] 90 % (03/04 1600) FiO2 (%):  [40 %-60 %] 60 % (03/04 1600) Weight:  [85.8 kg] 85.8 kg (03/04 0500)  Hemodynamic parameters for last 24 hours: CVP:  [0 mmHg-19 mmHg] 11 mmHg  Intake/Output from previous day: 03/03 0701 - 03/04 0700 In: 3865.6 [P.O.:60; I.V.:3015.6; NG/GT:790] Out: 7367 [Urine:1375] Intake/Output this shift: Total I/O In: 2309.1 [I.V.:1172.7; Blood:204; Other:90; NG/GT:664; IV Piggyback:178.4] Out: 1582 [Urine:95; Other:1487]       Exam    General- alert and comfortable    Neck- no JVD, no cervical adenopathy palpable, no carotid bruit   Lungs- clear without rales, wheezes   Cor- regular rate and rhythm, no murmur , gallop   Abdomen- soft, non-tender   Extremities - warm, non-tender, minimal edema   Neuro- oriented, appropriate, no focal weakness   Lab Results: Recent Labs    04/18/19 0356 04/18/19 0402 04/19/19 0401 04/19/19 0402 04/19/19 1235 04/19/19 1242  WBC 9.1  --  11.8*  --   --   --   HGB 8.0*   < > 8.5*   < > 9.5* 11.2*  HCT 26.2*   < > 28.1*   < > 28.0* 33.0*  PLT 237  --  255  --   --   --    < > = values in this interval not displayed.   BMET:  Recent Labs    04/18/19 0356 04/18/19 0402 04/19/19 0401 04/19/19 0402 04/19/19 1235 04/19/19 1242  NA 125*   < > 139    < > 127* 138  K 3.3*   < > 4.0   < > 3.4* 3.7  CL 82*   < > 90*   < > 79* 82*  CO2 29  --  37*  --   --   --   GLUCOSE 446*   < > 127*   < > 375* 257*  BUN 20   < > 21   < > 19 17  CREATININE 1.73*   < > 1.63*   < > 1.50* 1.30*  CALCIUM >15.0*  --  9.5  --   --   --    < > = values in this interval not displayed.    PT/INR: No results for input(s): LABPROT, INR in the last 72 hours. ABG    Component Value Date/Time   PHART 7.488 (H) 04/07/2019 0405   HCO3 25.9 04/07/2019 0405   TCO2 36 (H) 04/19/2019 1242   ACIDBASEDEF 1.0 04/05/2019 0540   O2SAT 67.8 04/14/2019 1253   CBG (last 3)  Recent Labs    04/19/19 0822 04/19/19 1206 04/19/19 1532  GLUCAP 133* 170* 126*    Assessment/Plan: S/P Procedure(s) (LRB): REMOVAL OF IMPELLA LEFT VENTRICULAR ASSIST DEVICE WITH INSERTION OF RIGHT FEMORAL ARTERIAL LINE (N/A) Mobilize with PT  Nocturnal tube feeds  Keep SAP > 100 mm Hg  with low dose norepi cont iv amio to maintain nsr Follow L pleural effusion with  CXR - may need L Pleurx  LOS: 36 days    Jesse Murphy 04/19/2019

## 2019-04-19 NOTE — Progress Notes (Signed)
Patient went into afib RVR, HR ranging from 120's-140's, BP 87/45 around 0415, confirmed with stat EKG. Amio 150 rapid bolus given per cardiology. Increased Levophed to 20 mcg/min. Paused fluid removal on CRRT. Patient not in distress.

## 2019-04-19 NOTE — Progress Notes (Signed)
[facility] Progress Note     Assessment/ Plan:   1. AKI(Cr was 0.7 prior to surgery)- due to postoperative CHF/cardiogenic shock that was refractory to diuretics and started on CRRT on 03/29/19 for volume removal. CRRT off on 2/23 - discussion with advanced CHF. CRRT restarted 2/24 then off after clotting. First HD on 2/26 with minimal UF and note increased pressors and rapid afib 1. We had to restart CRRT on 3/1 and there have been no issues with clotting as he's on citrate. 2. Tolerating  net UF goal to 100-140  as tolerated as long as CVP is above 10 he's more comfortable  Now. Neg 1116/ 1273/ 1977 -> bladder irrigation started. 3. On  Levo 10 mcg at this time. 4. Planning on stopping the CRRT when filter is clotting or due for change 2. Acute systolic CHF/cardiogenic shock- EF 25-30%; s/p Impella- now out. optimize volume as above. On midodrine - note increase to 15 mg TID. 3. CAD s/p CABG 3V on 7/98/92 complicated by cardiogenic shock and ARF. Postop echo EF 25-30% 4. Acute hypoxic respiratory failure with pseudomonas pneumonia- s/p trach on 03/27/19.s/pabx per PCCM  -> completed Meropenem. 5. PAF- s/p MAZE and LAA occlusion- therapy per CHF team 6. Hematuria- bladder irrigationper urology recommendations. 7. ABLA- Hx Melena. Transfuse as needed. Have given RRBC's on 2/22 and 2/23. And 2/25. Gave aranesp 2/23 40 mcg and then  60 mcg on 3/2  8. Protein malnutrition- moderate to severe 9. Secondary hyperparathyroidism/bone mineral - stopped the  renvela.  10. Disposition- Am concerned that he is a poor longterm dialysis candidate given his cardiogenic shock and trach.  Subjective:   Dyspnea is better, denies f/c/n/v Levo 10 mcg   Objective:   BP (!) 126/49   Pulse 77   Temp 97.8 F (36.6 C) (Oral)   Resp (!) 21   Ht '5\' 8"'$  (1.727 m)   Wt 85.8 kg   SpO2 93%   BMI 28.76 kg/m   Intake/Output Summary (Last 24 hours) at 04/19/2019 0955 Last data filed  at 04/19/2019 0900 Gross per 24 hour  Intake 4157.32 ml  Output 6855 ml  Net -2697.68 ml   Weight change: -3.6 kg  Physical Exam: Gen: elderly male in bed chronically ill-appearing HEENT trach in place; on vent  CVS: S1 S2 no rub Resp:clear;unlabored at rest; trach collar Abd: soft/ NT/ND  Ext: 1+edema, TEDS stockings on Neuro - awake and interactive and follows commands, nods Psych - no anxiety or agitation  GU foley in place withirrigation Access: left chest nontunneled dialysis catheterin lt SCV  Imaging: DG Chest Port 1 View  Result Date: 04/19/2019 CLINICAL DATA:  Respiratory failure. EXAM: PORTABLE CHEST 1 VIEW COMPARISON:  One-view chest x-ray 04/18/2019 FINDINGS: Heart is enlarged. Atherosclerotic changes are noted at the aortic arch. Tracheostomy tube is stable. Right-sided PICC line is stable. Left subclavian catheter is stable. Bilateral pleural effusions and airspace disease are again noted, left greater than right. Aeration is slightly improved. IMPRESSION: Slightly improved aeration with persistent bilateral pleural effusions and airspace disease, left greater than right. Electronically Signed   By: San Morelle M.D.   On: 04/19/2019 06:46   DG CHEST PORT 1 VIEW  Result Date: 04/18/2019 CLINICAL DATA:  Acute respiratory failure. EXAM: PORTABLE CHEST 1 VIEW COMPARISON:  April 17, 2019. FINDINGS: Stable cardiomegaly. Tracheostomy tube is unchanged in position. Left subclavian dialysis catheter is unchanged in position. Right-sided PICC line is unchanged. No pneumothorax is noted. Stable mild left  pleural effusion is noted. Stable bibasilar atelectasis or edema is noted. Small right pleural effusion is noted. Bony thorax is unremarkable. IMPRESSION: Stable support apparatus. Stable bibasilar atelectasis or edema is noted with associated pleural effusions. Electronically Signed   By: Marijo Conception M.D.   On: 04/18/2019 09:51    Labs: [employer] Recent Labs  Lab  04/15/19 0403 04/15/19 0403 04/16/19 0602 04/16/19 0602 04/16/19 1830 04/16/19 2010 04/17/19 0450 04/17/19 0753 04/17/19 1744 04/17/19 1957 04/18/19 0356 04/18/19 0402 04/19/19 0235 04/19/19 0241 04/19/19 0401 04/19/19 0402 04/19/19 0411 04/19/19 0623 04/19/19 0634  NA 136   < > 134*   < > 135   < > 132*   < > 137   < > 125*   < > 138 137 139 138 135 138 135  K 4.4   < > 4.9   < > 4.0   < > 3.6   < > 3.9   < > 3.3*   < > 3.8 3.9 4.0 3.8 3.9 3.7 3.7  CL 100   < > 97*   < > 98   < > 93*   < > 95*   < > 82*   < > 85* 85* 90* 84* 85* 84* 84*  CO2 23  --  22  --  24  --  27  --  29  --  29  --   --   --  37*  --   --   --   --   GLUCOSE 79   < > 79   < > 198*   < > 271*   < > 135*   < > 446*   < > 147* 98 127* 173* 132* 179* 150*  BUN 76*   < > 92*   < > 42*   < > 28*   < > 22   < > 20   < > _0 CREATININE 5.34*   < > 6.36*   < > 3.56*   < > 2.51*   < > 2.02*   < > 1.73*   < > 1.40* 1.50* 1.63* 1.30* 1.60* 1.30* 1.50*  CALCIUM 7.7*  7.4*  --  7.7*  --  7.1*  --  10.8*  --  7.2*  --  >15.0*  --   --   --  9.5  --   --   --   --   PHOS 7.5*  --  8.9*  --  4.6  --  2.9  --  2.0*  --  1.7*  --   --   --  2.0*  --   --   --   --    < > = values in this interval not displayed.   CBC Recent Labs  Lab 04/16/19 0602 04/16/19 2010 04/17/19 0450 04/17/19 0753 04/18/19 0356 04/18/19 0402 04/19/19 0401 04/19/19 0401 04/19/19 0402 04/19/19 0411 04/19/19 0623 04/19/19 0634  WBC 9.5  --  9.1  --  9.1  --  11.8*  --   --   --   --   --   NEUTROABS  --   --   --   --   --   --  9.2*  --   --   --   --   --   HGB 8.9*   < > 8.4*   < > 8.0*   < > 8.5*   < >  9.9* 9.2* 9.9* 9.2*  HCT 27.7*   < > 26.1*   < > 26.2*   < > 28.1*   < > 29.0* 27.0* 29.0* 27.0*  MCV 92.6  --  92.2  --  95.6  --  96.6  --   --   --   --   --   PLT 233  --  224  --  237  --  255  --   --   --   --   --    < > = values in this interval not displayed.    Medications:    . vitamin C  500 mg Oral  TID  . aspirin  81 mg Oral Daily  . atorvastatin  80 mg Oral q1800  . chlorhexidine gluconate (MEDLINE KIT)  15 mL Mouth Rinse BID  . Chlorhexidine Gluconate Cloth  6 each Topical Q0600  . Chlorhexidine Gluconate Cloth  6 each Topical Q0600  . darbepoetin (ARANESP) injection - NON-DIALYSIS  60 mcg Subcutaneous Q Tue-1800  . feeding supplement (NEPRO CARB STEADY)  237 mL Oral TID BM  . feeding supplement (PRO-STAT SUGAR FREE 64)  60 mL Per Tube BID  . Gerhardt's butt cream   Topical BID  . hydrocortisone   Rectal BID  . insulin aspart  0-24 Units Subcutaneous Q4H  . insulin glargine  30 Units Subcutaneous Daily  . mouth rinse  15 mL Mouth Rinse 10 times per day  . metoCLOPramide (REGLAN) injection  10 mg Intravenous Q6H  . midodrine  15 mg Oral TID WC  . multivitamin  1 tablet Oral QHS  . pantoprazole (PROTONIX) IV  40 mg Intravenous Q24H  . potassium chloride  20 mEq Oral Daily  . simethicone  80 mg Oral BID PC  . sodium chloride flush  10-40 mL Intracatheter Q12H  . thiamine  250 mg Per Tube Daily  . vitamin B-12  1,000 mcg Per Tube Daily      Otelia Santee, MD 04/19/2019, 9:55 AM

## 2019-04-19 NOTE — Progress Notes (Addendum)
Patient ID: Jesse Murphy, male   DOB: [birth year], 86 y.o.   MRN: [id number]    Advanced Heart Failure Rounding Note  PCP-Cardiologist: Kate Sable, MD   Subjective:    Impella removed 2/23. Intraoperative TEE showed EF ~40%  Remains on TC with speaking valve in place.   Remains on CVVHD. On NE 10 mcg    Denies pain. Denies SOB.    Objective:   Weight Range: 85.8 kg Body mass index is 28.76 kg/m.   Vital Signs:   Temp:  [97.8 F (36.6 C)-99.3 F (37.4 C)] 97.8 F (36.6 C) (03/04 0806) Pulse Rate:  [60-102] 79 (03/04 0900) Resp:  [13-33] 18 (03/04 0900) BP: (66-141)/(28-107) 103/43 (03/04 0900) SpO2:  [84 %-100 %] 100 % (03/04 0900) FiO2 (%):  [40 %] 40 % (03/04 0806) Weight:  [85.8 kg] 85.8 kg (03/04 0500) Last BM Date: 04/18/19  Weight change: Filed Weights   04/17/19 0500 04/18/19 0500 04/19/19 0500  Weight: 89.5 kg 89.4 kg 85.8 kg    Intake/Output:   Intake/Output Summary (Last 24 hours) at 04/19/2019 0909 Last data filed at 04/19/2019 0800 Gross per 24 hour  Intake 3946.87 ml  Output 6765 ml  Net -2818.13 ml      Physical Exam   General: No resp difficulty. Trach with PMV HEENT: [name] in place Neck: trach, supple. JVP hard to assess . Carotids 2+ bilat; no bruits. No lymphadenopathy or thryomegaly appreciated. L subclavian HD catheter  Cor: PMI nondisplaced. Regular rate & rhythm. No rubs, gallops or murmurs. Lungs: IW decreased in the bases. on trach collar FiO2 40%.  Abdomen: soft, nontender, nondistended. No hepatosplenomegaly. No bruits or masses. Good bowel sounds. Extremities: no cyanosis, clubbing, rash, edema. Upper extremities with ace wraps. RUE PICC Neuro: alert & orientedx3, cranial nerves grossly intact. moves all 4 extremities w/o difficulty. Affect pleasant GU: Foley CBI   Telemetry   NSR 80s but had A fib overnight  Labs    CBC Recent Labs    04/18/19 0356 04/18/19 0402 04/19/19 0401 04/19/19 0402 04/19/19 0623  04/19/19 0634  WBC 9.1  --  11.8*  --   --   --   NEUTROABS  --   --  9.2*  --   --   --   HGB 8.0*   < > 8.5*   < > 9.9* 9.2*  HCT 26.2*   < > 28.1*   < > 29.0* 27.0*  MCV 95.6  --  96.6  --   --   --   PLT 237  --  255  --   --   --    < > = values in this interval not displayed.   Basic Metabolic Panel Recent Labs    04/18/19 0356 04/18/19 0402 04/19/19 0401 04/19/19 0402 04/19/19 0623 04/19/19 0634  NA 125*   < > 139   < > 138 135  K 3.3*   < > 4.0   < > 3.7 3.7  CL 82*   < > 90*   < > 84* 84*  CO2 29  --  37*  --   --   --   GLUCOSE 446*   < > 127*   < > 179* 150*  BUN 20   < > 21   < > 19 23  CREATININE 1.73*   < > 1.63*   < > 1.30* 1.50*  CALCIUM >15.0*  --  9.5  --   --   --  MG 1.8  --  1.9  --   --   --   PHOS 1.7*  --  2.0*  --   --   --    < > = values in this interval not displayed.   Liver Function Tests Recent Labs    04/18/19 0356 04/19/19 0401  AST 16  --   ALT 7  --   ALKPHOS 67  --   BILITOT 0.6  --   PROT 4.6*  --   ALBUMIN 1.8* 2.1*   No results for input(s): LIPASE, AMYLASE in the last 72 hours. Cardiac Enzymes No results for input(s): CKTOTAL, CKMB, CKMBINDEX, TROPONINI in the last 72 hours.  BNP: BNP (last 3 results) Recent Labs    03/22/19 0401 03/26/19 0626 03/27/19 1255  BNP 340.4* 687.5* 800.4*    ProBNP (last 3 results) No results for input(s): PROBNP in the last 8760 hours.   D-Dimer No results for input(s): DDIMER in the last 72 hours. Hemoglobin A1C No results for input(s): HGBA1C in the last 72 hours. Fasting Lipid Panel No results for input(s): CHOL, HDL, LDLCALC, TRIG, CHOLHDL, LDLDIRECT in the last 72 hours. Thyroid Function Tests No results for input(s): TSH, T4TOTAL, T3FREE, THYROIDAB in the last 72 hours.  Invalid input(s): FREET3  Other results:   Imaging    DG Chest Port 1 View  Result Date: 04/19/2019 CLINICAL DATA:  Respiratory failure. EXAM: PORTABLE CHEST 1 VIEW COMPARISON:  One-view chest  x-ray 04/18/2019 FINDINGS: Heart is enlarged. Atherosclerotic changes are noted at the aortic arch. Tracheostomy tube is stable. Right-sided PICC line is stable. Left subclavian catheter is stable. Bilateral pleural effusions and airspace disease are again noted, left greater than right. Aeration is slightly improved. IMPRESSION: Slightly improved aeration with persistent bilateral pleural effusions and airspace disease, left greater than right. Electronically Signed   By: San Morelle M.D.   On: 04/19/2019 06:46     Medications:     Scheduled Medications: . amiodarone  200 mg Oral Daily  . vitamin C  500 mg Oral TID  . aspirin  81 mg Oral Daily  . atorvastatin  80 mg Oral q1800  . chlorhexidine gluconate (MEDLINE KIT)  15 mL Mouth Rinse BID  . Chlorhexidine Gluconate Cloth  6 each Topical Q0600  . Chlorhexidine Gluconate Cloth  6 each Topical Q0600  . darbepoetin (ARANESP) injection - NON-DIALYSIS  60 mcg Subcutaneous Q Tue-1800  . feeding supplement (NEPRO CARB STEADY)  237 mL Oral TID BM  . feeding supplement (PRO-STAT SUGAR FREE 64)  60 mL Per Tube BID  . Gerhardt's butt cream   Topical BID  . hydrocortisone   Rectal BID  . insulin aspart  0-24 Units Subcutaneous Q4H  . insulin glargine  30 Units Subcutaneous Daily  . mouth rinse  15 mL Mouth Rinse 10 times per day  . metoCLOPramide (REGLAN) injection  10 mg Intravenous Q6H  . midodrine  15 mg Oral TID WC  . multivitamin  1 tablet Oral QHS  . pantoprazole (PROTONIX) IV  40 mg Intravenous Q24H  . potassium chloride  20 mEq Oral Daily  . simethicone  80 mg Oral BID PC  . sodium chloride flush  10-40 mL Intracatheter Q12H  . thiamine  250 mg Per Tube Daily  . vitamin B-12  1,000 mcg Per Tube Daily    Infusions: . sodium chloride    . sodium chloride    . sodium chloride    . albumin human 12.5  g (04/19/19 0836)  . amiodarone 30 mg/hr (04/19/19 0800)  . calcium gluconate infusion for CRRT 110 mL/hr at 04/18/19 2013  .   ceFAZolin (ANCEF) IV    . feeding supplement (VITAL 1.5 CAL) Stopped (04/19/19 0000)  . norepinephrine (LEVOPHED) Adult infusion 14 mcg/min (04/19/19 0800)  . potassium chloride    . prismasol B22GK 4/0 300 mL/hr at 04/18/19 1753  . prismasol B22GK 4/0 1,500 mL/hr at 04/19/19 0017  . sodium chloride irrigation    . sodium citrate 2 %/dextrose 2.5% solution 3000 mL 420 mL/hr at 04/19/19 0500    PRN Medications: sodium chloride, sodium chloride, sodium chloride, oxyCODONE **AND** acetaminophen, dextrose, fentaNYL (SUBLIMAZE) injection, heparin, hyoscyamine, levalbuterol, lidocaine (PF), lidocaine-prilocaine, ondansetron (ZOFRAN) IV, pentafluoroprop-tetrafluoroeth, promethazine, Resource ThickenUp Clear, sodium chloride, sodium chloride flush, sodium chloride flush    Assessment/Plan   1. Acute systolic HF -> Cardiogenic shock - post-op echo on 03/20/19 EF 25-30% (pre-op 30-35%. In 12/2017 EF normal) - Required Impella support post CABG. Impella removed 2/23 - failed iHD on 3/1. Now back on CVVHD.  -Tolerating well but requiring NE 10 and midodrine 15 tid to maintain pressure - CVVHD per nephrology  - CVP once PICC issues resolved.  - Liberalize salt in diet. + TED hose. May need ab binder or florinef.   2. CAD s/p CABG this admit (LIMA->LAD, SVG->OM, SVG->RCA on 03/15/19) - No chest pain.  - Continue ASA +statin  3. Acute hypoxic respiratory failure with Pseudomonas PNA - s/p trach on 03/27/19 - on TC with PMV.   - completed meropenem for pseudomonas PNA  4. PAF - s/p MAZE and LAA occlusion - Had A fib RVR over night.   -Continue IV amio 30 mg per hour - Off systemic heparin with hematuria and melena/ anemia.  - DVT dose enox started on 3/1 toelrating   5. AKI  - due to shock/ATN - Started [facility] 03/29/19 per Nephrology.    6. Hematuria  -Improved. Off CBI. - Urology following  7. Melena -No melena over night.    8. Anemia -Hematuria/melena. -Hgb 8.4 -> 8.2 -> 8.9 ->  8.4->8  9. Debility, severe - continue PT - repeat OOB and in chair today  10. Hyperkalemia - Resolved.    11. UE swelling - u/s negative for DVT.  - Continue wraps.   12. F/E/N - Tube feeds per Cor-trak  13. Left Pleural Effusion  Per Dr Darcey Nora plan for CT today    Darrick Grinder, NP  9:09 AM   Agree with above.   Continues to struggle. Remains on NE and CVVHD. Seems to have made some urine this am but hard to quantify due to CBI bag still hanging. No further bleeding. Volume status improved. Remains in trach collar. Cor-trak now in place. Left pleural effusion smaller with fluid removal from CVVHD. In/out AF now back on iv amio.   General:  Lying in bed. fatigued appearing. No resp difficulty HEENT: normal Neck: supple. + trach. Carotids 2+ bilat; no bruits. No lymphadenopathy or thryomegaly appreciated. Cor: PMI nondisplaced. Regular rate & rhythm. No rubs, gallops or murmurs. Lungs: clear Abdomen: soft, nontender, nondistended. No hepatosplenomegaly. No bruits or masses. Good bowel sounds. Extremities: no cyanosis, clubbing, rash, 1-2+ edema Neuro: awake on vent follows commands  Remains very tenuous. Remains pressor dependent. Remains on CVVHD. Seems likel he may be starting to make some urine. Once CVVHD filter clots will hold on restarting for 1-2 days and see if he will continue to make more  urine. Continue niughttime feeds. Continue PT/OT. Wean NE as tolerated. If no progress in next few days will have to re-address GOC with patient and family.   CRITICAL CARE Performed by: Glori Bickers  Total critical care time: 35 minutes  Critical care time was exclusive of separately billable procedures and treating other patients.  Critical care was necessary to treat or prevent imminent or life-threatening deterioration.  Critical care was time spent personally by me (independent of midlevel providers or residents) on the following activities: development of treatment  plan with patient and/or surrogate as well as nursing, discussions with consultants, evaluation of patient's response to treatment, examination of patient, obtaining history from patient or surrogate, ordering and performing treatments and interventions, ordering and review of laboratory studies, ordering and review of radiographic studies, pulse oximetry and re-evaluation of patient's condition.  Glori Bickers, MD  10:05 PM

## 2019-04-19 NOTE — Progress Notes (Addendum)
NAME:  Jesse Murphy, MRN:  [id number], DOB:  1928/12/11, LOS: 68 ADMISSION DATE:  03/13/2019, CONSULTATION DATE:  2/2 REFERRING MD:  Nils Pyle, CHIEF COMPLAINT:  Dyspnea   Brief History   86 y/o male admitted 1/26, underwent CABG and L atrial appendage clipping on 1/29, moved to ICU on 2/1 for intubation.  Past Medical History  A fib, CAD, Prostate cancer, HTN  Significant Hospital Events   1/29 CABG 2/01 to ICU 2/05 Extubated 2/08 Reintubated 2/09 tracheostomy 2/09 Impella, PA catheter placed 2/11 CRRT initiated 2/16 leukocytosis, add vancomycin 2/17 start TC trials 2/19 urology placed 3 way foley, bleeding at rectal tube site 2/21 ATC initiated  2/22 1 unit PRBC 2/23 1 unit PRBC. Impella removed 2/25 ATC trach collar. 3/1 Converted to A fib in HD 3/1 Started CVVHD, net negative 50 cc per hour, on Levo at 14  Consults:  Nephrology CHF team Urology  Procedures:  Rt PICC 2/02 >> Trach 2/09 >> PA catheter 2/09 > 2/12  Lt Winston HD cath 2/11 >> L Rad Aline 2/3 > 2/20  Impella 2/10 > 2/23  Significant Diagnostic Tests:  Echo 1/26 > EF 30 to 35% Echo 2/09 > EF 50-55%  Micro Data:  SARS CoV2 PCR 1/26 > negative Influenza PCR 1/26 > negative Sputum 2/01 > oral flora Sputum 2/08 > Pseudomonas >> pan sensitive  Blood 2/16 > negative Sputum 2/16 > Pseudomonas sens to cipro, gent, imepenem Resp 2/20 > PA sens cipro, gent, impipenem BCx2 2/20 > neg  Antimicrobials:  Rocephin 2/01 > 2/07 Cefepime 2/08 > 2/10 Fortaz 2/10 > 2/16 Vancomycin 2/16 > 2/19 Meropenem 2/20 > 2/26  Interim history/subjective:  Overnight with A.Fib RVR with HR 120-140 requiring additional amiodarone bolus.   Objective   Blood pressure (!) 118/47, pulse 83, temperature 97.8 F (36.6 C), temperature source Oral, resp. rate 15, height 5\' 8"  (1.727 m), weight 85.8 kg, SpO2 92 %. CVP:  [0 mmHg-19 mmHg] 19 mmHg  FiO2 (%):  [40 %] 40 %   Intake/Output Summary (Last 24 hours) at 04/19/2019  0827 Last data filed at 04/19/2019 0800 Gross per 24 hour  Intake 3757.55 ml  Output 7064 ml  Net -3306.45 ml   Filed Weights   04/17/19 0500 04/18/19 0500 04/19/19 0500  Weight: 89.5 kg 89.4 kg 85.8 kg   Physical Exam: General: Elderly male, sitting in bed, no distress  HEENT: Tracheostomy with Passy-Muir valve trach collar in place Neuro: alert, oriented, follows commands  CV: Irregular, Rate Controlled, no MRG  PULM: diminished breath sounds to bases, no crackles/wheeze  GI: distended, soft, active bowel sounds  GU: Three-way Foley is in place. Urine Clear  Extremities: Anasarca all extremities Skin: Midsternal surgical dressing intact, clean    Resolved Hospital Problem list   Pseudomonas HCAP s/p completion meropenem  Assessment & Plan:   Acute Hypoxemic Respiratory Failure due to acute pulmonary edema, pneumonia. Small Left Pleural Post-pericardiotomy Effusion  S/P tracheostomy 2/9 -Remains off Vent Since 2/24 -CXR 3/4 with Slightly improved aeration with persistent bilateral pleural effusions and airspace diease left greater then right  Plan  Continue trach collar - Currently on 40% Fi02  Titrate Supplemental Oxygen for Saturation goal >92  Trend CXR PRN  Speech therapy is following is tolerating Passy-Muir valve May be able to downsize trach later in the week >> Will Discuss Cap Trial vs Down-sizing    Cardiogenic Shock with acute systolic heart failure s/p Impella removal 2/23.  Atrial fibrillation s/p MAZE  and LAA occlusion  Recurrent need for vasopressors with persistent impaired vasoreactivity. -EF 50-55% -Relative adrenal insufficiency ruled out Plan  Continue vasopressor and midodrine management for MAP Systolic Goal >27  Continue to Pull Volume via CRRT for goal net negative  Continue vitamin supplementation  Persistent Hematuria with retained clots - hematuria clearing -3/3 noted with gross hematuria  Plan Urology following > Restarted Bladder    AKI secondary to Cardiogenic Shock -CRRT re-started 3/1 for progressive hypotension  Plan Nephrology Following  Continue CRRT Trend BMP Replace electrolytes as indicated   Anemia of critical illness. Some flexiseal trauma and hematuria as above Plan -Trend CBC  -Transfuse for Hgb <7% or significant bleeding  -Holding heparin gtt  DM2 with hyperglycemia, poorly controlled CBG (last 3)  Plan Continue sliding scale insulin protocol Adjustments in Lantus per cardiovascular thoracic surgery  Pressure wounds / Reported as Skin Tear 2/15 Plan Continue wound ostomy care interventions   Best practice:  Diet: Dysphagia 1 diet DVT prophylaxis: SCD's GI prophylaxis: PPI Mobility: bed rest Code Status: Partial Code  Disposition: ICU  Labs    CMP Latest Ref Rng & Units 04/19/2019 04/19/2019 04/19/2019  Glucose 70 - 99 mg/dL 150(H) 179(H) 132(H)  BUN 8 - 23 mg/dL 23 19 22   Creatinine 0.61 - 1.24 mg/dL 1.50(H) 1.30(H) 1.60(H)  Sodium 135 - 145 mmol/L 135 138 135  Potassium 3.5 - 5.1 mmol/L 3.7 3.7 3.9  Chloride 98 - 111 mmol/L 84(L) 84(L) 85(L)  CO2 22 - 32 mmol/L - - -  Calcium 8.9 - 10.3 mg/dL - - -  Total Protein 6.5 - 8.1 g/dL - - -  Total Bilirubin 0.3 - 1.2 mg/dL - - -  Alkaline Phos 38 - 126 U/L - - -  AST 15 - 41 U/L - - -  ALT 0 - 44 U/L - - -    CBC Latest Ref Rng & Units 04/19/2019 04/19/2019 04/19/2019  WBC 4.0 - 10.5 K/uL - - -  Hemoglobin 13.0 - 17.0 g/dL 9.2(L) 9.9(L) 9.2(L)  Hematocrit 39.0 - 52.0 % 27.0(L) 29.0(L) 27.0(L)  Platelets 150 - 400 K/uL - - -    ABG    Component Value Date/Time   PHART 7.488 (H) 04/07/2019 0405   PCO2ART 34.4 04/07/2019 0405   PO2ART 101.0 04/07/2019 0405   HCO3 25.9 04/07/2019 0405   TCO2 42 (H) 04/19/2019 0634   ACIDBASEDEF 1.0 04/05/2019 0540   O2SAT 67.8 04/14/2019 1253    CBG (last 3)  Recent Labs    04/18/19 2357 04/19/19 0409 04/19/19 0822  GLUCAP 170* 132* 133*   App CCT 32 min  Hayden Pedro,  AGACNP-BC [facility]  Pgr: [phone]  PCCM Pgr: [phone]

## 2019-04-20 ENCOUNTER — Inpatient Hospital Stay (HOSPITAL_COMMUNITY): Payer: Medicare Other

## 2019-04-20 DIAGNOSIS — Z430 Encounter for attention to tracheostomy: Secondary | ICD-10-CM

## 2019-04-20 DIAGNOSIS — N179 Acute kidney failure, unspecified: Secondary | ICD-10-CM

## 2019-04-20 DIAGNOSIS — J90 Pleural effusion, not elsewhere classified: Secondary | ICD-10-CM

## 2019-04-20 DIAGNOSIS — E119 Type 2 diabetes mellitus without complications: Secondary | ICD-10-CM

## 2019-04-20 DIAGNOSIS — I480 Paroxysmal atrial fibrillation: Secondary | ICD-10-CM

## 2019-04-20 DIAGNOSIS — R579 Shock, unspecified: Secondary | ICD-10-CM

## 2019-04-20 LAB — POCT I-STAT, CHEM 8
BUN: 45 mg/dL — ABNORMAL HIGH (ref 8–23)
BUN: 48 mg/dL — ABNORMAL HIGH (ref 8–23)
BUN: 53 mg/dL — ABNORMAL HIGH (ref 8–23)
BUN: 40 mg/dL — ABNORMAL HIGH (ref 8–23)
BUN: 53 mg/dL — ABNORMAL HIGH (ref 8–23)
Calcium, Ion: 0.42 mmol/L — CL (ref 1.15–1.40)
Calcium, Ion: 1.4 mmol/L (ref 1.15–1.40)
Calcium, Ion: 1.42 mmol/L — ABNORMAL HIGH (ref 1.15–1.40)
Calcium, Ion: 0.37 mmol/L — CL (ref 1.15–1.40)
Calcium, Ion: 1.35 mmol/L (ref 1.15–1.40)
Chloride: 84 mmol/L — ABNORMAL LOW (ref 98–111)
Chloride: 85 mmol/L — ABNORMAL LOW (ref 98–111)
Chloride: 90 mmol/L — ABNORMAL LOW (ref 98–111)
Chloride: 85 mmol/L — ABNORMAL LOW (ref 98–111)
Chloride: 87 mmol/L — ABNORMAL LOW (ref 98–111)
Creatinine, Ser: 2.2 mg/dL — ABNORMAL HIGH (ref 0.61–1.24)
Creatinine, Ser: 2.3 mg/dL — ABNORMAL HIGH (ref 0.61–1.24)
Creatinine, Ser: 2.7 mg/dL — ABNORMAL HIGH (ref 0.61–1.24)
Creatinine, Ser: 1.9 mg/dL — ABNORMAL HIGH (ref 0.61–1.24)
Creatinine, Ser: 2.6 mg/dL — ABNORMAL HIGH (ref 0.61–1.24)
Glucose, Bld: 224 mg/dL — ABNORMAL HIGH (ref 70–99)
Glucose, Bld: 235 mg/dL — ABNORMAL HIGH (ref 70–99)
Glucose, Bld: 273 mg/dL — ABNORMAL HIGH (ref 70–99)
Glucose, Bld: 185 mg/dL — ABNORMAL HIGH (ref 70–99)
Glucose, Bld: 200 mg/dL — ABNORMAL HIGH (ref 70–99)
HCT: 23 % — ABNORMAL LOW (ref 39.0–52.0)
HCT: 31 % — ABNORMAL LOW (ref 39.0–52.0)
HCT: 31 % — ABNORMAL LOW (ref 39.0–52.0)
HCT: 25 % — ABNORMAL LOW (ref 39.0–52.0)
HCT: 29 % — ABNORMAL LOW (ref 39.0–52.0)
Hemoglobin: 10.5 g/dL — ABNORMAL LOW (ref 13.0–17.0)
Hemoglobin: 10.5 g/dL — ABNORMAL LOW (ref 13.0–17.0)
Hemoglobin: 7.8 g/dL — ABNORMAL LOW (ref 13.0–17.0)
Hemoglobin: 8.5 g/dL — ABNORMAL LOW (ref 13.0–17.0)
Hemoglobin: 9.9 g/dL — ABNORMAL LOW (ref 13.0–17.0)
Potassium: 3.4 mmol/L — ABNORMAL LOW (ref 3.5–5.1)
Potassium: 4 mmol/L (ref 3.5–5.1)
Potassium: 4 mmol/L (ref 3.5–5.1)
Potassium: 3.8 mmol/L (ref 3.5–5.1)
Potassium: 3.9 mmol/L (ref 3.5–5.1)
Sodium: 127 mmol/L — ABNORMAL LOW (ref 135–145)
Sodium: 134 mmol/L — ABNORMAL LOW (ref 135–145)
Sodium: 135 mmol/L (ref 135–145)
Sodium: 131 mmol/L — ABNORMAL LOW (ref 135–145)
Sodium: 137 mmol/L (ref 135–145)
TCO2: 32 mmol/L (ref 22–32)
TCO2: 34 mmol/L — ABNORMAL HIGH (ref 22–32)
TCO2: 38 mmol/L — ABNORMAL HIGH (ref 22–32)
TCO2: 31 mmol/L (ref 22–32)
TCO2: 36 mmol/L — ABNORMAL HIGH (ref 22–32)

## 2019-04-20 LAB — CBC
HCT: 27.9 % — ABNORMAL LOW (ref 39.0–52.0)
Hemoglobin: 8.6 g/dL — ABNORMAL LOW (ref 13.0–17.0)
MCH: 29.1 pg (ref 26.0–34.0)
MCHC: 30.8 g/dL (ref 30.0–36.0)
MCV: 94.3 fL (ref 80.0–100.0)
Platelets: 216 10*3/uL (ref 150–400)
RBC: 2.96 MIL/uL — ABNORMAL LOW (ref 4.22–5.81)
RDW: 16.5 % — ABNORMAL HIGH (ref 11.5–15.5)
WBC: 9.1 10*3/uL (ref 4.0–10.5)
nRBC: 0 % (ref 0.0–0.2)

## 2019-04-20 LAB — HEPATIC FUNCTION PANEL
ALT: 10 U/L (ref 0–44)
AST: 23 U/L (ref 15–41)
Albumin: 2.2 g/dL — ABNORMAL LOW (ref 3.5–5.0)
Alkaline Phosphatase: 64 U/L (ref 38–126)
Bilirubin, Direct: 0.1 mg/dL (ref 0.0–0.2)
Indirect Bilirubin: 0.7 mg/dL (ref 0.3–0.9)
Total Bilirubin: 0.8 mg/dL (ref 0.3–1.2)
Total Protein: 4.9 g/dL — ABNORMAL LOW (ref 6.5–8.1)

## 2019-04-20 LAB — TYPE AND SCREEN
ABO/RH(D): B POS
Antibody Screen: NEGATIVE
Unit division: 0

## 2019-04-20 LAB — RENAL FUNCTION PANEL
Albumin: 2.1 g/dL — ABNORMAL LOW (ref 3.5–5.0)
Anion gap: 13 (ref 5–15)
BUN: 63 mg/dL — ABNORMAL HIGH (ref 8–23)
CO2: 39 mmol/L — ABNORMAL HIGH (ref 22–32)
Calcium: 8.3 mg/dL — ABNORMAL LOW (ref 8.9–10.3)
Chloride: 83 mmol/L — ABNORMAL LOW (ref 98–111)
Creatinine, Ser: 3.14 mg/dL — ABNORMAL HIGH (ref 0.61–1.24)
GFR calc Af Amer: 19 mL/min — ABNORMAL LOW (ref >60)
GFR calc non Af Amer: 17 mL/min — ABNORMAL LOW (ref >60)
Glucose, Bld: 155 mg/dL — ABNORMAL HIGH (ref 70–99)
Phosphorus: 2.2 mg/dL — ABNORMAL LOW (ref 2.5–4.6)
Potassium: 4.4 mmol/L (ref 3.5–5.1)
Sodium: 135 mmol/L (ref 135–145)

## 2019-04-20 LAB — GLUCOSE, CAPILLARY
Glucose-Capillary: 137 mg/dL — ABNORMAL HIGH (ref 70–99)
Glucose-Capillary: 105 mg/dL — ABNORMAL HIGH (ref 70–99)
Glucose-Capillary: 139 mg/dL — ABNORMAL HIGH (ref 70–99)
Glucose-Capillary: 113 mg/dL — ABNORMAL HIGH (ref 70–99)
Glucose-Capillary: 147 mg/dL — ABNORMAL HIGH (ref 70–99)
Glucose-Capillary: 214 mg/dL — ABNORMAL HIGH (ref 70–99)
Glucose-Capillary: 216 mg/dL — ABNORMAL HIGH (ref 70–99)

## 2019-04-20 LAB — BPAM RBC
Blood Product Expiration Date: 202103302359
ISSUE DATE / TIME: 202103040606
Unit Type and Rh: 7300

## 2019-04-20 LAB — MAGNESIUM: Magnesium: 1.8 mg/dL (ref 1.7–2.4)

## 2019-04-20 LAB — AMYLASE: Amylase: 19 U/L — ABNORMAL LOW (ref 28–100)

## 2019-04-20 LAB — CALCIUM, IONIZED: Calcium, Ionized, Serum: 4.7 mg/dL (ref 4.5–5.6)

## 2019-04-20 MED ORDER — FUROSEMIDE 10 MG/ML IJ SOLN
160.0000 mg | Freq: Once | INTRAVENOUS | Status: AC
  Administered 2019-04-20: 160 mg via INTRAVENOUS
  Filled 2019-04-20: qty 16

## 2019-04-20 MED ORDER — MAGNESIUM SULFATE 2 GM/50ML IV SOLN
2.0000 g | Freq: Once | INTRAVENOUS | Status: DC
  Filled 2019-04-20: qty 50

## 2019-04-20 MED ORDER — ALBUMIN HUMAN 25 % IV SOLN
12.5000 g | Freq: Four times a day (QID) | INTRAVENOUS | Status: AC
  Administered 2019-04-20 – 2019-04-21 (×3): 12.5 g via INTRAVENOUS
  Filled 2019-04-20 (×3): qty 50

## 2019-04-20 MED ORDER — SODIUM CHLORIDE 0.9 % IV SOLN
1.0000 g | INTRAVENOUS | Status: DC
  Administered 2019-04-20 – 2019-04-21 (×2): 1 g via INTRAVENOUS
  Filled 2019-04-20: qty 1
  Filled 2019-04-20: qty 10
  Filled 2019-04-20: qty 1

## 2019-04-20 MED ORDER — METOCLOPRAMIDE HCL 5 MG/ML IJ SOLN
5.0000 mg | Freq: Three times a day (TID) | INTRAMUSCULAR | Status: DC
  Administered 2019-04-20 – 2019-04-23 (×7): 5 mg via INTRAVENOUS
  Filled 2019-04-20 (×9): qty 2

## 2019-04-20 NOTE — Progress Notes (Signed)
Chaplain engaged in follow-up visit with Jesse Murphy and his son.  Chaplain offered support and engaged in check-in with Jesse Murphy.    Chaplain will continue to follow-up.

## 2019-04-20 NOTE — Progress Notes (Addendum)
NAME:  Jesse Murphy, MRN:  [id number], DOB:  1928-06-18, LOS: 65 ADMISSION DATE:  03/13/2019, CONSULTATION DATE:  2/2 REFERRING MD:  Nils Pyle, CHIEF COMPLAINT:  Dyspnea   Brief History   86 y/o male admitted 1/26, underwent CABG and L atrial appendage clipping on 1/29, moved to ICU on 2/1 for intubation.  Past Medical History  A fib, CAD, Prostate cancer, HTN  Significant Hospital Events   1/29 CABG 2/01 to ICU 2/05 Extubated 2/08 Reintubated 2/09 tracheostomy 2/09 Impella, PA catheter placed 2/11 CRRT initiated 2/16 leukocytosis, add vancomycin 2/17 start TC trials 2/19 urology placed 3 way foley, bleeding at rectal tube site 2/21 ATC initiated  2/22 1 unit PRBC 2/23 1 unit PRBC. Impella removed 2/25 ATC trach collar. 3/1 Converted to A fib in HD 3/1 Started CVVHD, net negative 50 cc per hour, on Levo at 14 3/4: CVVH d/c'd 3/4: Down Sized to #4 Cuff less trach  Consults:  Nephrology CHF team Urology  Procedures:  Rt PICC 2/02 >> Trach 2/09 >> PA catheter 2/09 > 2/12  Lt [place] HD cath 2/11 >> L Rad [place] 2/3 > 2/20  Impella 2/10 > 2/23  Significant Diagnostic Tests:  Echo 1/26 > EF 30 to 35% Echo 2/09 > EF 50-55%  Micro Data:  SARS CoV2 PCR 1/26 > negative Influenza PCR 1/26 > negative Sputum 2/01 > oral flora Sputum 2/08 > Pseudomonas >> pan sensitive  Blood 2/16 > negative Sputum 2/16 > Pseudomonas sens to cipro, gent, imepenem Resp 2/20 > PA sens cipro, gent, impipenem BCx2 2/20 > neg  Antimicrobials:  Rocephin 2/01 > 2/07 Cefepime 2/08 > 2/10 Fortaz 2/10 > 2/16 Vancomycin 2/16 > 2/19 Meropenem 2/20 > 2/26  Interim history/subjective:  Overnight had increased oxygen needs overnight requiring increase from 40% TC to 80% TC New fever to 100.3, WBC is 9.1 Continuous bladder irrigations +  1 L overnight, + 19 L since admission. ( ? Accuracy with bladder irrigation ) CXR pending>> concern effusions may be worsening off CVVHD Trach down sized 3/4 to  #4 Remains on Levo at 3 mcg/kg  Objective   Blood pressure (!) 120/50, pulse 83, temperature 98.5 F (36.9 C), temperature source Axillary, resp. rate (!) 22, height 5\' 8"  (1.727 m), weight 87 kg, SpO2 91 %. CVP:  [4 mmHg-11 mmHg] 10 mmHg  FiO2 (%):  [40 %-80 %] 40 %   Intake/Output Summary (Last 24 hours) at 04/20/2019 0816 Last data filed at 04/20/2019 0700 Gross per 24 hour  Intake 3363.85 ml  Output 2692 ml  Net 671.85 ml   Filed Weights   04/18/19 0500 04/19/19 0500 04/20/19 0500  Weight: 89.4 kg 85.8 kg 87 kg   Physical Exam: General: Elderly male, sitting in bed, no distress, very alert, states he is nauseated  HEENT: NCAT, Tracheostomy with  trach collar in place, No LAD Neuro: alert, oriented, follows commands  CV:S1, S2,  Irregular, Rate Controlled on amio, no MRG  PULM: Bilateral chest excursion, diminished breath sounds per  bases, few crackles/ a few Bilateral wheezes  GI: distended, soft, active bowel sounds , nauseated GU: Three-way Foley is in place. Urine Clear , Continuous bladder irrigation Extremities: Generalized edema all extremities Skin: Midsternal surgical dressing intact, clean and dry    Resolved Hospital Problem list   Pseudomonas HCAP s/p completion meropenem  Assessment & Plan:   Acute Hypoxemic Respiratory Failure due to acute pulmonary edema, pneumonia. Small Left Pleural Post-pericardiotomy Effusion  S/P tracheostomy 2/9 -Remains  off Vent Since 2/24 - CXR 3/5 with Bibasilar opacities L>R, Atelectasis vs consolidation Small Right, and small to moderate L effusion - Increased oxygen needs overnight 3/5 , + 1 L last 24 - Downsized to #4 cuff less on 3/4 Plan  Continue trach collar - Currently weaned back to 40% from 80% Fi02 overnight  Titrate Supplemental Oxygen for Saturation goal >92  Trend CXR PRN to monitor effusions CT Chest to better evaluate bi basilar opacities Consider Empiric Antibiotics ( Hx Pseudomonas) Consider Lasix  challenge/ HD or resuming CVVHD ( remains on pressors) Speech therapy is following >> is tolerating Passy-Muir valve  New low grade fever 3/5 ( 100.3) WBC is 9.1 Plan Trend WBC and fever curve CXR 3/6 and prn Sputum Culture Follow micro Additional cultures as is clinically indicated   Cardiogenic Shock with acute systolic heart failure s/p Impella removal 2/23.  Atrial fibrillation s/p MAZE and LAA occlusion  Recurrent need for vasopressors with persistent impaired vasoreactivity. Remains on Amio gtt -EF 50-55% -Relative adrenal insufficiency ruled out Plan MAP Goal > 65  Continue vasopressor and midodrine management for MAP Systolic Goal >13  Continue vitamin supplementation   Persistent Hematuria with retained clots - hematuria clearing -3/3 noted with gross hematuria  Plan Urology following > Restarted Bladder Irrigations  AKI secondary to Cardiogenic Shock -CRRT d/c'd 3/4 - Creatinine up 2.46 from 1.65/ BUN up 45 from 24 Plan Nephrology Following Trend BMP Replace electrolytes as indicated   Anemia of critical illness. Some flexiseal trauma and hematuria as above HGB drop from 9.2 to 8.6>> ? Hemodilutional Plan -Trend CBC  -Transfuse for Hgb <7% or significant bleeding  -Holding heparin gtt  DM2 with hyperglycemia, poorly controlled CBG (last 3)  Plan Continue sliding scale insulin protocol Adjustments in Lantus per cardiovascular thoracic surgery  Pressure wounds / Reported as Skin Tear 2/15 Plan Continue wound ostomy care interventions   Best practice:  Diet: Dysphagia 2 diet DVT prophylaxis: SCD's GI prophylaxis: PPI Mobility: bed rest Code Status: Partial Code  Disposition: ICU  Labs    CMP Latest Ref Rng & Units 04/20/2019 04/19/2019 04/19/2019  Glucose 70 - 99 mg/dL 153(H) 286(H) 285(H)  BUN 8 - 23 mg/dL 45(H) 24(H) 24(H)  Creatinine 0.61 - 1.24 mg/dL 2.46(H) 1.65(H) 1.50(H)  Sodium 135 - 145 mmol/L 138 135 132(L)  Potassium 3.5 - 5.1 mmol/L  4.0 3.5 3.4(L)  Chloride 98 - 111 mmol/L 85(L) 82(L) 80(L)  CO2 22 - 32 mmol/L 41(H) 38(H) -  Calcium 8.9 - 10.3 mg/dL 8.6(L) 14.1(HH) -  Total Protein 6.5 - 8.1 g/dL - - -  Total Bilirubin 0.3 - 1.2 mg/dL - - -  Alkaline Phos 38 - 126 U/L - - -  AST 15 - 41 U/L - - -  ALT 0 - 44 U/L - - -    CBC Latest Ref Rng & Units 04/20/2019 04/19/2019 04/19/2019  WBC 4.0 - 10.5 K/uL 9.1 - -  Hemoglobin 13.0 - 17.0 g/dL 8.6(L) 9.2(L) 10.9(L)  Hematocrit 39.0 - 52.0 % 27.9(L) 27.0(L) 32.0(L)  Platelets 150 - 400 K/uL 216 - -    ABG    Component Value Date/Time   PHART 7.488 (H) 04/07/2019 0405   PCO2ART 34.4 04/07/2019 0405   PO2ART 101.0 04/07/2019 0405   HCO3 25.9 04/07/2019 0405   TCO2 41 (H) 04/19/2019 1649   ACIDBASEDEF 1.0 04/05/2019 0540   O2SAT 67.8 04/14/2019 1253    CBG (last 3)  Recent Labs    04/19/19  2355 04/20/19 0425 04/20/19 0727  GLUCAP 127* 137* 105*   App CCT 33 min  Magdalen Spatz, MSN, AGACNP-BC [place] Pager # [phone] After 4 pm please call [phone] 04/20/2019 8:16 AM

## 2019-04-20 NOTE — Progress Notes (Signed)
eLink Physician-Brief Progress Note Patient Name: Jesse Murphy DOB: 1928/09/02 MRN: [id number]   Date of Service  04/20/2019  HPI/Events of Note  Patient has increased FiO2 requirements. Increased from 40% trach collar to 80% trach collar to maintain oxygen saturations in 90s.  He is net +1.2 L for the past day and his CXR yesterday was mildly overloaded to start with.  He also has an uptrend in his temperature. Most recently 100.3 F.  Patient is in no distress.   eICU Interventions  Obtain CXR to further evaluate for volume overload vs pneumonia.       Intervention Category Major Interventions: Hypoxemia - evaluation and management  Marily Lente Demorris Choyce 04/20/2019, 1:12 AM

## 2019-04-20 NOTE — Progress Notes (Signed)
[address]       [place] [address]             [phone]      10 Days Post-Op Procedure(s) (LRB): REMOVAL OF IMPELLA LEFT VENTRICULAR ASSIST DEVICE WITH INSERTION OF RIGHT FEMORAL ARTERIAL LINE (N/A) Subjective: Feels ok, did have some increase in O2 need overnignt  Objective: Vital signs in last 24 hours: Temp:  [97.6 F (36.4 C)-100.9 F (38.3 C)] 98.5 F (36.9 C) (03/05 0745) Pulse Rate:  [65-113] 82 (03/05 0745) Cardiac Rhythm: Normal sinus rhythm (03/05 0400) Resp:  [11-29] 21 (03/05 0745) BP: (67-127)/(21-91) 111/47 (03/05 0730) SpO2:  [79 %-100 %] 92 % (03/05 0745) FiO2 (%):  [40 %-80 %] 60 % (03/05 0400) Weight:  [87 kg] 87 kg (03/05 0500)  Hemodynamic parameters for last 24 hours: CVP:  [4 mmHg-17 mmHg] 10 mmHg  Intake/Output from previous day: 03/04 0701 - 03/05 0700 In: 3759.5 [P.O.:360; I.V.:1704.7; Blood:204; NG/GT:1130.5; IV Piggyback:270.3] Out: 2684 [Urine:1045] Intake/Output this shift: No intake/output data recorded.  General appearance: alert, cooperative and no distress Heart: regular rate and rhythm Lungs: dim left base Abdomen: benign Extremities: LE edema conts to improve, UE edema stable Wound: incis healing well  Lab Results: Recent Labs    04/19/19 0401 04/19/19 0402 04/19/19 1649 04/20/19 0530  WBC 11.8*  --   --  9.1  HGB 8.5*   < > 9.2* 8.6*  HCT 28.1*   < > 27.0* 27.9*  PLT 255  --   --  216   < > = values in this interval not displayed.   BMET:  Recent Labs    04/19/19 1754 04/20/19 0530  NA 135 138  K 3.5 4.0  CL 82* 85*  CO2 38* 41*  GLUCOSE 286* 153*  BUN 24* 45*  CREATININE 1.65* 2.46*  CALCIUM 14.1* 8.6*    PT/INR: No results for input(s): LABPROT, INR in the last 72 hours. ABG    Component Value Date/Time   PHART 7.488 (H) 04/07/2019 0405   HCO3 25.9 04/07/2019 0405   TCO2 41 (H) 04/19/2019 1649   ACIDBASEDEF 1.0 04/05/2019 0540   O2SAT 67.8 04/14/2019 1253   CBG (last 3)    Recent Labs    04/19/19 2355 04/20/19 0425 04/20/19 0727  GLUCAP 127* 137* 105*    Meds Scheduled Meds: . vitamin C  500 mg Oral TID  . aspirin  81 mg Oral Daily  . atorvastatin  80 mg Oral q1800  . chlorhexidine gluconate (MEDLINE KIT)  15 mL Mouth Rinse BID  . Chlorhexidine Gluconate Cloth  6 each Topical Q0600  . Chlorhexidine Gluconate Cloth  6 each Topical Q0600  . darbepoetin (ARANESP) injection - NON-DIALYSIS  60 mcg Subcutaneous Q Tue-1800  . feeding supplement (NEPRO CARB STEADY)  237 mL Oral TID BM  . feeding supplement (PRO-STAT SUGAR FREE 64)  60 mL Per Tube BID  . Gerhardt's butt cream   Topical BID  . hydrocortisone   Rectal BID  . insulin aspart  0-24 Units Subcutaneous Q4H  . insulin glargine  30 Units Subcutaneous Daily  . mouth rinse  15 mL Mouth Rinse 10 times per day  . metoCLOPramide (REGLAN) injection  10 mg Intravenous Q6H  . midodrine  15 mg Oral TID WC  . multivitamin  1 tablet Oral QHS  . pantoprazole (PROTONIX) IV  40 mg Intravenous Q24H  . simethicone  80 mg Oral BID PC  .  sodium chloride flush  10-40 mL Intracatheter Q12H  . thiamine  250 mg Per Tube Daily  . vitamin B-12  1,000 mcg Per Tube Daily   Continuous Infusions: . sodium chloride    . sodium chloride    . sodium chloride    . amiodarone 30 mg/hr (04/20/19 0730)  . calcium gluconate infusion for CRRT Stopped (04/19/19 1800)  . feeding supplement (VITAL 1.5 CAL) 30 mL/hr at 04/19/19 1721  . norepinephrine (LEVOPHED) Adult infusion 6 mcg/min (04/20/19 0700)  . prismasol B22GK 4/0 Stopped (04/19/19 1700)  . prismasol B22GK 4/0 Stopped (04/19/19 1700)  . sodium chloride irrigation Stopped (04/19/19 1700)  . sodium citrate 2 %/dextrose 2.5% solution 3000 mL 250 mL/hr at 04/19/19 1600   PRN Meds:.sodium chloride, sodium chloride, sodium chloride, oxyCODONE **AND** acetaminophen, dextrose, fentaNYL (SUBLIMAZE) injection, heparin, hyoscyamine, levalbuterol, lidocaine (PF),  lidocaine-prilocaine, ondansetron (ZOFRAN) IV, pentafluoroprop-tetrafluoroeth, promethazine, Resource ThickenUp Clear, sodium chloride, sodium chloride flush, sodium chloride flush  Xrays DG Chest Port 1 View  Result Date: 04/19/2019 CLINICAL DATA:  Respiratory failure. EXAM: PORTABLE CHEST 1 VIEW COMPARISON:  One-view chest x-ray 04/18/2019 FINDINGS: Heart is enlarged. Atherosclerotic changes are noted at the aortic arch. Tracheostomy tube is stable. Right-sided PICC line is stable. Left subclavian catheter is stable. Bilateral pleural effusions and airspace disease are again noted, left greater than right. Aeration is slightly improved. IMPRESSION: Slightly improved aeration with persistent bilateral pleural effusions and airspace disease, left greater than right. Electronically Signed   By: San Morelle M.D.   On: 04/19/2019 06:46    Assessment/Plan: S/P Procedure(s) (LRB): REMOVAL OF IMPELLA LEFT VENTRICULAR ASSIST DEVICE WITH INSERTION OF RIGHT FEMORAL ARTERIAL LINE (N/A)  1 hemodynamics pretty stable on levophed. Conts aggressive AHF management as per team 2 O2 [name] management/pulm management as per CCM 3 SR on amio gtt 4 + fever, tmax 100.9, monitor closely- potential sources favor pulm/Urinry tract. WBC 9.1 5 nephrology conts to manage AKI issues  6 urine pretty clear, cont CBI  LOS: 37 days    John Giovanni PA-C Pager [phone] 04/20/2019

## 2019-04-20 NOTE — Progress Notes (Signed)
CT surgery p.m. Rounds  Patient did not respond to Lasix challenge so will be placed on CRT Otherwise patient had a good day, norepinephrine weaned to 5 mcg and maintaining sinus rhythm Tolerating dysphagia to chopped meat and vegetable diet Trach downsized to #6 yesterday Patient neuro intact CT scan of chest shows probable lingular infiltrate and patient started on ceftriaxone.  Tracheal aspirate sent for cultures.

## 2019-04-20 NOTE — Discharge Summary (Incomplete Revision)
Physician Discharge Summary  ?Patient ID: ?Jesse Murphy ?MRN: [id number] ?DOB/AGE: 1928-04-10 86 y.o. ? ?Admit date: 03/13/2019 ?Discharge date: 05/21/2019 ? ?Admission Diagnoses: ? ?Discharge Diagnoses:  ?Principal Problem: ?  Atrial fibrillation with rapid ventricular response ([place]) ?Active Problems: ?  PAF (paroxysmal atrial fibrillation) ([place]) ?  GERD (gastroesophageal reflux disease) ?  Chronic anticoagulation ?  CAD S/P percutaneous coronary angioplasty ?  Type 2 diabetes mellitus without complications ([place]) ?  Non Hodgkin's lymphoma ([place]) ?  Chronic diastolic heart failure ([place]) ?  Hypertension ?  Atrial fibrillation with RVR ([place]) ?  Non-ST elevation (NSTEMI) myocardial infarction [facility]) ?  Acute on chronic combined systolic and diastolic CHF (congestive heart failure) ([place]) ?  Coronary artery disease due to lipid rich plaque ?  S/P CABG x 3 ?  Acute respiratory failure with hypoxia ([place]) ?  Pressure injury of skin ?  Palliative care encounter ?  AKI (acute kidney injury) ([place]) ? ?Patient Active Problem List  ? Diagnosis Date Noted  ?? AKI (acute kidney injury) ([place])   ?? Palliative care encounter   ?? Pressure injury of skin 03/25/2019  ?? Acute respiratory failure with hypoxia ([place])   ?? S/P CABG x 3 03/16/2019  ?? Non-ST elevation (NSTEMI) myocardial infarction [facility])   ?? Acute on chronic combined systolic and diastolic CHF (congestive heart failure) ([place])   ?? Coronary artery disease due to lipid rich plaque   ?? Atrial fibrillation with RVR ([place]) 03/14/2019  ?? Atrial fibrillation with rapid ventricular response ([place]) 03/13/2019  ?? Chest pain 09/12/2018  ?? Chronic diastolic heart failure ([place]) 05/29/2018  ?? Hypertension 05/29/2018  ?? Non Hodgkin's lymphoma ([place]) 09/30/2017  ?? Heme positive stool 08/11/2017  ?? Chronic anticoagulation 07/05/2016  ?? CAD S/P percutaneous coronary angioplasty 07/05/2016  ?? Type 2 diabetes mellitus without complications ([place]) 75/88/3254  ? ? PAF (paroxysmal atrial fibrillation) ([place]) 06/14/2016  ?? Chest pain in adult 04/3

## 2019-04-20 NOTE — Progress Notes (Signed)
  Speech Language Pathology Treatment: Dysphagia;[place] Speaking valve  Patient Details Name: Jesse Murphy MRN: [id number] DOB: Jul 28, 1928 Today's Date: 04/20/2019 Time: 0370-9643 SLP Time Calculation (min) (ACUTE ONLY): 22 min  Assessment / Plan / Recommendation Clinical Impression  PMV: Pt alert and cooperative with son at bedside for session. PMV was placed and tolerated for a total of 15 minutes during the session, but was removed several times during the session, d/t desaturations to 85-86%. With min cues to take deep breaths or as PMV was removed, desats resolved to 91-93%. Pt was also noted to have mild congestion upon arrival which may have effected toleration. RT present and aware for suction. Recommend pt to wear valve throughout the day with close supervision, as VS are stable.  Swallowing: Pt was seen for f/u for diet toleration. RN and son reported pt was nauseous today, but tolerated his breakfast well this am (eggs). Pt stated he felt fine for a snack, and was observed with chopped fruit and NTL. He was noted to have intermittent and delayed coughing throughout the session, but was congested and coughing prior to administration of POs. At the end of session, pt stated he felt like a peach was "stuck in his throat," but cleared with a throat clear and liquid wash with min cues. No s/sx of aspiration following NTL. Recommend continuing with diet. Will continue to follow.    HPI HPI: 86 y.o. male with PMH: h/o CAD s/p CABG and clipping of left atrial appendage, who presented with SOB on 2/1. He became hypoxic on 2/2 with pulmonary edema and needed to be intubated on 2/2 and extubated 2/5. Reintubated 2/8.  Pt underwent impella placement and tracheostomy on 2/9. Pt started on CRRT 2/11. ATC trials on 2/17. Impella removal 2/23.      SLP Plan  Continue with current plan of care       Recommendations  Diet recommendations: Dysphagia 2 (fine chop);Nectar-thick liquid Liquids  provided via: Cup;Straw Medication Administration: Crushed with puree Supervision: Full supervision/cueing for compensatory strategies;Staff to assist with self feeding Compensations: Slow rate;Small sips/bites;Follow solids with liquid;Effortful swallow Postural Changes and/or Swallow Maneuvers: Seated upright 90 degrees      Patient may use Passy-Muir Speech Valve: During all waking hours (remove during sleep);During all therapies with supervision;During PO intake/meals PMSV Supervision: Intermittent         Oral Care Recommendations: Oral care BID Follow up Recommendations: Skilled Nursing facility SLP Visit Diagnosis: Dysphagia, oropharyngeal phase (R13.12) Plan: Continue with current plan of care       GO               Aline August, Student SLP Office: [phone]  04/20/2019, 3:57 PM

## 2019-04-20 NOTE — Progress Notes (Signed)
10 Days Post-Op Procedure(s) (LRB): REMOVAL OF IMPELLA LEFT VENTRICULAR ASSIST DEVICE WITH INSERTION OF RIGHT FEMORAL ARTERIAL LINE (N/A) Subjective nauseated after breakfast nsr Making some urine but difficult to measure with intermittent bladder irrigation\ CXR mild wet  Objective: Vital signs in last 24 hours: Temp:  [97.6 F (36.4 C)-100.9 F (38.3 C)] 98.5 F (36.9 C) (03/05 0745) Pulse Rate:  [65-113] 81 (03/05 0830) Cardiac Rhythm: Normal sinus rhythm (03/05 0800) Resp:  [11-29] 19 (03/05 0830) BP: (67-127)/(21-91) 96/49 (03/05 0830) SpO2:  [79 %-99 %] 87 % (03/05 0830) FiO2 (%):  [40 %-80 %] 40 % (03/05 0800) Weight:  [87 kg] 87 kg (03/05 0500)  Hemodynamic parameters for last 24 hours: CVP:  [4 mmHg-11 mmHg] 10 mmHg  Intake/Output from previous day: 03/04 0701 - 03/05 0700 In: 3759.5 [P.O.:360; I.V.:1704.7; Blood:204; NG/GT:1130.5; IV Piggyback:270.3] Out: 2684 [Urine:1045] Intake/Output this shift: Total I/O In: 21.1 [I.V.:21.1] Out: -   EXAM  Alert on trach collar Slight exp wheeze Abd benign extrem warm and pink  Lab Results: Recent Labs    04/19/19 0401 04/19/19 0402 04/19/19 1649 04/20/19 0530  WBC 11.8*  --   --  9.1  HGB 8.5*   < > 9.2* 8.6*  HCT 28.1*   < > 27.0* 27.9*  PLT 255  --   --  216   < > = values in this interval not displayed.   BMET:  Recent Labs    04/19/19 1754 04/20/19 0530  NA 135 138  K 3.5 4.0  CL 82* 85*  CO2 38* 41*  GLUCOSE 286* 153*  BUN 24* 45*  CREATININE 1.65* 2.46*  CALCIUM 14.1* 8.6*    PT/INR: No results for input(s): LABPROT, INR in the last 72 hours. ABG    Component Value Date/Time   PHART 7.488 (H) 04/07/2019 0405   HCO3 25.9 04/07/2019 0405   TCO2 41 (H) 04/19/2019 1649   ACIDBASEDEF 1.0 04/05/2019 0540   O2SAT 67.8 04/14/2019 1253   CBG (last 3)  Recent Labs    04/19/19 2355 04/20/19 0425 04/20/19 0727  GLUCAP 127* 137* 105*    Assessment/Plan: S/P Procedure(s) (LRB): REMOVAL OF  IMPELLA LEFT VENTRICULAR ASSIST DEVICE WITH INSERTION OF RIGHT FEMORAL ARTERIAL LINE (N/A) Recurrent nausea- patient has been on iv amiodarone for extended course- will check LFTs , amylase Mobilize OOB to chair Lasix challenge may be indicated with Increased wt and FiO2  Tharon Aquas Trigt III 04/20/2019

## 2019-04-20 NOTE — Progress Notes (Addendum)
Patient ID: Jesse Murphy, male   DOB: 12-Jan-1929, 86 y.o.   MRN: [id number]    Advanced Heart Failure Rounding Note  PCP-Cardiologist: Kate Sable, MD   Subjective:    Impella removed 2/23. Intraoperative TEE showed EF ~40%  Remains on TC with speaking valve in place.   CVVHD stopped 3/4. 950 cc in UOP charted by nightshift. Remains on NE 6 mcg .  Sitting up in bed. Developed nausea post breakfast but no vomiting. Feeling a bit better now.   Objective:   Weight Range: 87 kg Body mass index is 29.16 kg/m.   Vital Signs:   Temp:  [97.6 F (36.4 C)-100.9 F (38.3 C)] 98.5 F (36.9 C) (03/05 0745) Pulse Rate:  [65-113] 83 (03/05 0748) Resp:  [11-29] 22 (03/05 0748) BP: (67-127)/(21-91) 120/50 (03/05 0748) SpO2:  [79 %-100 %] 91 % (03/05 0748) FiO2 (%):  [40 %-80 %] 40 % (03/05 0748) Weight:  [87 kg] 87 kg (03/05 0500) Last BM Date: 04/20/19  Weight change: Filed Weights   04/18/19 0500 04/19/19 0500 04/20/19 0500  Weight: 89.4 kg 85.8 kg 87 kg    Intake/Output:   Intake/Output Summary (Last 24 hours) at 04/20/2019 0818 Last data filed at 04/20/2019 0700 Gross per 24 hour  Intake 3363.85 ml  Output 2692 ml  Net 671.85 ml      Physical Exam   CVP 11 PHYSICAL EXAM: General:  Chronically ill appearing WM. No respiratory difficulty HEENT: normal + TC, + cor trak  Neck: supple. + TC, JVD difficult to assess, Carotids 2+ bilat; no bruits. No lymphadenopathy or thyromegaly appreciated. Cor: PMI nondisplaced. Regular rate & rhythm. No rubs, gallops or murmurs. Lungs: clear Abdomen: soft, nontender, nondistended. No hepatosplenomegaly. No bruits or masses. Good bowel sounds. Extremities: no cyanosis, clubbing, rash, trace LEE edema, upper extremities edematous, LUE wrapped  Neuro: alert & oriented x 3, cranial nerves grossly intact. moves all 4 extremities w/o difficulty. Affect pleasant.    Telemetry   NSR 80s Personally reviewed   Labs    CBC Recent  Labs    04/19/19 0401 04/19/19 0402 04/19/19 1649 04/20/19 0530  WBC 11.8*  --   --  9.1  NEUTROABS 9.2*  --   --   --   HGB 8.5*   < > 9.2* 8.6*  HCT 28.1*   < > 27.0* 27.9*  MCV 96.6  --   --  94.3  PLT 255  --   --  216   < > = values in this interval not displayed.   Basic Metabolic Panel Recent Labs    04/19/19 0401 04/19/19 0402 04/19/19 1754 04/20/19 0530  NA 139   < > 135 138  K 4.0   < > 3.5 4.0  CL 90*   < > 82* 85*  CO2 37*   < > 38* 41*  GLUCOSE 127*   < > 286* 153*  BUN 21   < > 24* 45*  CREATININE 1.63*   < > 1.65* 2.46*  CALCIUM 9.5   < > 14.1* 8.6*  MG 1.9  --   --  1.8  PHOS 2.0*   < > 1.4* 1.8*   < > = values in this interval not displayed.   Liver Function Tests Recent Labs    04/18/19 0356 04/19/19 0401 04/19/19 1754 04/20/19 0530  AST 16  --   --   --   ALT 7  --   --   --  ALKPHOS 67  --   --   --   BILITOT 0.6  --   --   --   PROT 4.6*  --   --   --   ALBUMIN 1.8*   < > 2.2* 2.2*   < > = values in this interval not displayed.   No results for input(s): LIPASE, AMYLASE in the last 72 hours. Cardiac Enzymes No results for input(s): CKTOTAL, CKMB, CKMBINDEX, TROPONINI in the last 72 hours.  BNP: BNP (last 3 results) Recent Labs    03/22/19 0401 03/26/19 0626 03/27/19 1255  BNP 340.4* 687.5* 800.4*    ProBNP (last 3 results) No results for input(s): PROBNP in the last 8760 hours.   D-Dimer No results for input(s): DDIMER in the last 72 hours. Hemoglobin A1C No results for input(s): HGBA1C in the last 72 hours. Fasting Lipid Panel No results for input(s): CHOL, HDL, LDLCALC, TRIG, CHOLHDL, LDLDIRECT in the last 72 hours. Thyroid Function Tests No results for input(s): TSH, T4TOTAL, T3FREE, THYROIDAB in the last 72 hours.  Invalid input(s): FREET3  Other results:   Imaging    No results found.   Medications:     Scheduled Medications: . vitamin C  500 mg Oral TID  . aspirin  81 mg Oral Daily  . atorvastatin   80 mg Oral q1800  . chlorhexidine gluconate (MEDLINE KIT)  15 mL Mouth Rinse BID  . Chlorhexidine Gluconate Cloth  6 each Topical Q0600  . Chlorhexidine Gluconate Cloth  6 each Topical Q0600  . darbepoetin (ARANESP) injection - NON-DIALYSIS  60 mcg Subcutaneous Q Tue-1800  . feeding supplement (NEPRO CARB STEADY)  237 mL Oral TID BM  . feeding supplement (PRO-STAT SUGAR FREE 64)  60 mL Per Tube BID  . Gerhardt's butt cream   Topical BID  . hydrocortisone   Rectal BID  . insulin aspart  0-24 Units Subcutaneous Q4H  . insulin glargine  30 Units Subcutaneous Daily  . mouth rinse  15 mL Mouth Rinse 10 times per day  . metoCLOPramide (REGLAN) injection  10 mg Intravenous Q6H  . midodrine  15 mg Oral TID WC  . multivitamin  1 tablet Oral QHS  . pantoprazole (PROTONIX) IV  40 mg Intravenous Q24H  . simethicone  80 mg Oral BID PC  . sodium chloride flush  10-40 mL Intracatheter Q12H  . thiamine  250 mg Per Tube Daily  . vitamin B-12  1,000 mcg Per Tube Daily    Infusions: . sodium chloride    . sodium chloride    . sodium chloride    . amiodarone 30 mg/hr (04/20/19 0730)  . calcium gluconate infusion for CRRT Stopped (04/19/19 1800)  . feeding supplement (VITAL 1.5 CAL) 30 mL/hr at 04/19/19 1721  . norepinephrine (LEVOPHED) Adult infusion 6 mcg/min (04/20/19 0700)  . prismasol B22GK 4/0 Stopped (04/19/19 1700)  . prismasol B22GK 4/0 Stopped (04/19/19 1700)  . sodium chloride irrigation Stopped (04/19/19 1700)  . sodium citrate 2 %/dextrose 2.5% solution 3000 mL 250 mL/hr at 04/19/19 1600    PRN Medications: sodium chloride, sodium chloride, sodium chloride, oxyCODONE **AND** acetaminophen, dextrose, fentaNYL (SUBLIMAZE) injection, heparin, hyoscyamine, levalbuterol, lidocaine (PF), lidocaine-prilocaine, ondansetron (ZOFRAN) IV, pentafluoroprop-tetrafluoroeth, promethazine, Resource ThickenUp Clear, sodium chloride, sodium chloride flush, sodium chloride flush    Assessment/Plan    1. Acute systolic HF -> Cardiogenic shock - post-op echo on 03/20/19 EF 25-30% (pre-op 30-35%. In 12/2017 EF normal) - Required Impella support post CABG. Impella removed 2/23 -  failed iHD on 3/1. Restarted on CVVHD but discontinued 3/4. Now watching for renal recovery. Nephrology following.  - Continues to require NE 6 and midodrine 15 tid to maintain pressure - May need to resume CVVHD for volume control if poor renal recovery   - CVP 11 today  - Liberalize salt in diet. + TED hose. May need ab binder or florinef.   2. CAD s/p CABG this admit (LIMA->LAD, SVG->OM, SVG->RCA on 03/15/19) - No chest pain.  - Continue ASA +statin  3. Acute hypoxic respiratory failure with Pseudomonas PNA - s/p trach on 03/27/19 - on TC with PMV.   - completed meropenem for pseudomonas PNA  4. PAF - s/p MAZE and LAA occlusion - Had recurrent A fib RVR overnight 3/3. Back in NSR - Continue IV amio 30 mg per hour - Off systemic heparin with hematuria and melena/ anemia.    5. AKI  - due to shock/ATN - Has required CVHHD, currently off as of 3/4 - SCr up, 1.65>>2.46. UOP poor. May need to resume [facility] - Nephrology following    6. Hematuria -Improved but hgb trending back down, 9.2>>8.6. Off CBI. -Continue to monitor  -Urology following  7. Melena -No melena overnight.    8. Anemia -Hematuria/melena. -Hgb 8.4 -> 8.2 -> 8.9 -> 8.4->8->9.2->8.6   9. Debility, severe - continue PT  10. Hyperkalemia - Resolved.  K 4.0   11. UE swelling - u/s negative for DVT.  - Continue wraps.   12. F/E/N - Tube feeds per Cor-trak  13. Left Pleural Effusion  - improved. CXR 3/4 w/ minimal left pleural effusion  - repeat CXR today pending    Lyda Jester, PA-C  8:18 AM   Agree with above.   Off CVVHD x 24 hours. Chart report 1L urine output overnight bt RN says this is not accurate. NE down 15 -> 6. Remains on midodrine. In NSR on IV amio. Hgb stable. No bleeding. WBC up to 26K  General:   Weak appearing. No resp difficulty HEENT: normal Neck: supple. + trach (capped) Carotids 2+ bilat; no bruits. No lymphadenopathy or thryomegaly appreciated. Cor: PMI nondisplaced. Regular rate & rhythm. No rubs, gallops or murmurs. Left subclav trialysis Lungs: clear Abdomen: soft, nontender, nondistended. No hepatosplenomegaly. No bruits or masses. Good bowel sounds. Extremities: no cyanosis, clubbing, rash, 2+ edema Neuro: alert & orientedx3, cranial nerves grossly intact. moves all 4 extremities w/o difficulty. Affect pleasant Chart reports some u/o but RN says this is not the case. NE being weaned down. Will hold CVVHD again today.  WBC has shot up. More ill today. CXR with increasing infiltrates  Give lasix 160 IV x 1. Bcx x 2. Check PCT. Empiric abx, Continue pulmonary toilet.  CRITICAL CARE Performed by: Glori Bickers  Total critical care time: 35 minutes  Critical care time was exclusive of separately billable procedures and treating other patients.  Critical care was necessary to treat or prevent imminent or life-threatening deterioration.  Critical care was time spent personally by me (independent of midlevel providers or residents) on the following activities: development of treatment plan with patient and/or surrogate as well as nursing, discussions with consultants, evaluation of patient's response to treatment, examination of patient, obtaining history from patient or surrogate, ordering and performing treatments and interventions, ordering and review of laboratory studies, ordering and review of radiographic studies, pulse oximetry and re-evaluation of patient's condition.  Glori Bickers, MD  9:04 AM

## 2019-04-20 NOTE — Discharge Summary (Addendum)
Physician Discharge Summary  Patient ID: Jesse Murphy MRN: [id number] DOB/AGE: [birth year] 86 y.o.  Admit date: 03/13/2019 Discharge date: 05/22/2019  Admission Diagnoses:  Discharge Diagnoses:  Principal Problem:   Atrial fibrillation with rapid ventricular response (HCC) Active Problems:   PAF (paroxysmal atrial fibrillation) (HCC)   GERD (gastroesophageal reflux disease)   Chronic anticoagulation   CAD S/P percutaneous coronary angioplasty   Type 2 diabetes mellitus without complications (HCC)   Non Hodgkin's lymphoma (HCC)   Chronic diastolic heart failure (HCC)   Hypertension   Atrial fibrillation with RVR (HCC)   Non-ST elevation (NSTEMI) myocardial infarction (HCC)   Acute on chronic combined systolic and diastolic CHF (congestive heart failure) (HCC)   Coronary artery disease due to lipid rich plaque   S/P CABG x 3   Acute respiratory failure with hypoxia (HCC)   Pressure injury of skin   Palliative care encounter   AKI (acute kidney injury) [facility])  Patient Active Problem List   Diagnosis Date Noted   AKI (acute kidney injury) ([place])    Palliative care encounter    Pressure injury of skin 03/25/2019   Acute respiratory failure with hypoxia (HCC)    S/P CABG x 3 03/16/2019   Non-ST elevation (NSTEMI) myocardial infarction [facility])    Acute on chronic combined systolic and diastolic CHF (congestive heart failure) ([place])    Coronary artery disease due to lipid rich plaque    Atrial fibrillation with RVR ([place]) 03/14/2019   Atrial fibrillation with rapid ventricular response ([place]) 03/13/2019   Chest pain 09/12/2018   Chronic diastolic heart failure ([place]) 05/29/2018   Hypertension 05/29/2018   Non Hodgkin's lymphoma ([place]) 09/30/2017   Heme positive stool 08/11/2017   Chronic anticoagulation 07/05/2016   CAD S/P percutaneous coronary angioplasty 07/05/2016   Type 2 diabetes mellitus without complications ([place]) 49/67/5916   PAF (paroxysmal atrial fibrillation) ([place])  06/14/2016   Chest pain in adult 06/14/2016   Dyspnea on exertion 06/14/2016   Allergic rhinitis 06/14/2016   GERD (gastroesophageal reflux disease) 06/14/2016   BPH (benign prostatic hyperplasia) 06/14/2016   History of present illness:  The patient is a 86 year old male who presented to [facility] emergency department on 03/13/2019 for evaluation of elevated heart rate which had started early that morning.  Additionally, he had associated chest discomfort, palpitations and bilateral arm pain.  When EMS arrived his initial heart rate was close to 200 and he was initially given intravenous Cardizem 20 mg IV with some improvement in rate to approximately 150.  EKG showed atrial fibrillation with RVR.  Patient had attempted DCCV at 100 J and 200 J but remained in atrial fibrillation.  He was started on oral Lasix.  Cardiology consultation was obtained.  A repeat echocardiogram was obtained and it showed ejection fraction 30 to 35% which compared to previous study in November 2019 was decreased from 55 to 60%.  Echo also showed mild left ventricular hypertrophy, global hypokinesis, mildly reduced RV function, and trivial MR and trivial TR.  The patient was admitted for further evaluation and treatment.  This would include cardiac catheterization.  Discharged Condition: stable  Hospital Course: The patient was admitted for medical stabilization.  On 03/15/2019 he was taken to the Cath Lab where he was found to have severe multivessel coronary artery disease.  He was found to have severe left main and three-vessel coronary artery disease with ejection fraction at 30%.  Due to these findings cardiothoracic surgical consultation was obtained with Dahlia Byes MD who  evaluated the patient and studies and recommended coronary artery bypass grafting as his best  revascularization option due to the severity of the anatomic findings and degree of the disease.  Due to his age and multiple comorbidities he was  considered a high risk surgical candidate.   Postoperative hospital course:  The patient has had a very complex post operative course due to multiple issues.  He initially required multiple inotropic and pressor support in the advanced heart failure team was consulted to assist with management.  Ultimately he did have a Impella placed on 03/27/2019 by Dr. Orvan Seen.  He postoperatively the patient remained on the ventilator in the pulmonary critical care team assisted with management.  He had tracheostomy done also by Dr. Orvan Seen on 03/27/2019.  The patient has also had atrial fibrillation throughout the postoperative period with intermittent episodes of sinus rhythm. Most recently he has remained in sinus rhythm.   He has been on an amiodarone drip during the post operative periodas well. patient also had significant postoperative volume overload and required very aggressive diuresis.  This in conjunction with the Impella and inotropic support were not successful in managing the volume overload and ultimately nephrology consultation was obtained and CRRT was initiated on 03/29/2019.  Patient has additionally had hemodialysis but this has been difficult due to blood pressure  and bleeding issues.  Following CRRT the patient has developed both melena as well as hematuria.  He has required multiple blood transfusions.  Urology has assisted with management of hematuria and he has required multiple days of continuous bladder irrigation.  Bleeding issues have improved over time.  Additionally cystoscopy was performed where he was found to have findings consistent with radiation-induced cystitis.  He also received DDAVP on 3/16.  He has been transitioned to intermittent hemodialysis which initially did not tolerate very well but has improved over time.  Now he is on Monday/ Wednesday/ Friday schedule.  All inotropes and pressors have been discontinued at this time but he is on midodrine to assist blood pressure.  His  tracheostomy was discontinued and he was decannulated on 04/28/2019 he has had tube feedings throughout and had modified barium swallow on 05/01/2019 and is now having both tube feeds and dysphagia 1 diet.  He has developed a skin rash that was felt to be possibly consistent with a drug reaction but is uncertain what the offending agent is.  He is being treated currently with emollients and hydroxyzine as needed.  He was also given a course of steroids and this has improved over time.  On 05/10/2019 he had a profound bradycardia with vagal reaction and a transvenous pacer was placed urgently by Dr. Darcey Nora.  On 05/11/2019 on his rhythm has remained  stable he did not require further consult.  As of 05/11/2019 he continued to require CBI for hematuria.  He also continues to have issues with melena.  His hemoglobin hematocrit are being monitored very closely.He eventually underwent bladder fulgeration with formalin instillation and foley has been removed. He has urinary incontinence of very small amounts of urine.  but hematuria has resolved and foley catheter is out. He is hemodyn stabe off all pressors and inotropes and vasopressors. He continues with tube feeds and has had a swallow study ; he is also  also on dysphagia 2 diet. He has severe ongoing debility and is being evaluated for CIR. His anemia status is currently stable but he is being further evaluated for thrombocytopenia. His platelets were up to  91,000  and HIT was negative. As discussed with Dr. Prescott Gum, he was restarted on low dose Lipitor on 04/05. He is on a dysphagia II diet but still with poor oral intake (which is why he is still on TFs at 60 ml/hr). He developed a rash on 04/05. It was on his face, chest, small areas on lower legs and appeared to maybe be a contact dermatitis. The only new medication was low dose Atorvastatin (which he took prior to surgery) so this was stopped but he also applied a new moisturizer which may be the etiology. He  was given steroids. As discussed with Dr. Prescott Gum, patient is felt surgically stable for discharge to CIR today.   Consults: cardiology, pulmonary/intensive care, nephrology and urology  Significant Diagnostic Studies: angiography: cardiac cath,CT abd/pelvis,  echocardiogram  Treatments: surgery:  OPERATIVE REPORT   DATE OF PROCEDURE:  03/16/2019   SURGEON:  Ivin Poot III, MD   OPERATION: 1.  Coronary artery bypass grafting x3 (left internal mammary artery to left anterior descending, saphenous vein graft to obtuse marginal 1, saphenous vein graft to posterior descending). 2.  Left atrial maze procedure with pulmonary vein isolation using the AtriCure radiofrequency ablation. 3.  Application of 35 mm left atrial appendage AtriCure clip. 4.  Endoscopic harvest of right leg greater saphenous vein.   PREOPERATIVE DIAGNOSES:   1.  Non-ST elevation myocardial infarction with left main and severe 3-vessel coronary artery disease. 1.  Moderate to severe left ventricular dysfunction, low cardiac output  preoperatively with elevated wedge 30 mmHg. 2.  Rapid atrial fibrillation, symptomatic, requiring direct current cardioversion x2 on presentation in the emergency department without successful restoration of sinus rhythm. 3.  History of B-cell lymphoma and prostate cancer. Discharge Exam: Blood pressure (!) 119/46, pulse 80, temperature 98.2 F (36.8 C), temperature source Oral, resp. rate 17, height 5' 7.99" (1.727 m), weight 83.3 kg, SpO2 97 %. Cardiovascular: RRR Pulmonary: Clear to auscultation on the right and slightly diminished left base Abdomen: Soft, non tender, bowel sounds present. Extremities: Mild bilateral lower extremity edema. Wounds: Clean and dry.  No erythema or signs of infection.  Disposition:  Stable and to CIR    Allergies as of 05/22/2019       Reactions   Heparin Other (See Comments)   Thrombocytopenia/ HIT        Medication List     STOP taking  these medications    acetaminophen 325 MG tablet Commonly known as: TYLENOL Replaced by: acetaminophen 160 MG/5ML solution   Flintstones Plus Iron chewable tablet   fluticasone 50 MCG/ACT nasal spray Commonly known as: FLONASE   furosemide 20 MG tablet Commonly known as: LASIX   loratadine 10 MG tablet Commonly known as: CLARITIN   metoprolol tartrate 50 MG tablet Commonly known as: LOPRESSOR   pantoprazole 40 MG tablet Commonly known as: PROTONIX   rivaroxaban 20 MG Tabs tablet Commonly known as: XARELTO   tamsulosin 0.4 MG Caps capsule Commonly known as: FLOMAX       TAKE these medications    acetaminophen 160 MG/5ML solution Commonly known as: TYLENOL Place 10.2 mLs (325 mg total) into feeding tube every 4 (four) hours as needed for mild pain, moderate pain or headache. Replaces: acetaminophen 325 MG tablet   anticoagulant sodium citrate 4 GM/100ML Soln 5 mLs by Intracatheter route every Monday, Wednesday, and Friday with hemodialysis.   aspirin 81 MG chewable tablet Chew 1 tablet (81 mg total) by mouth daily.   atorvastatin 10  MG tablet Commonly known as: LIPITOR Take 1 tablet (10 mg total) by mouth daily at 6 PM. What changed: when to take this   calcium acetate (Phos Binder) 667 MG/5ML Soln Commonly known as: PHOSLYRA Place 5 mLs (667 mg total) into feeding tube 3 (three) times daily with meals.   chlorhexidine 0.12 % solution Commonly known as: PERIDEX 15 mLs by Mouth Rinse route 2 (two) times daily.   diphenhydrAMINE 25 mg capsule Commonly known as: BENADRYL Take 1 capsule (25 mg total) by mouth every 6 (six) hours as needed for itching.   famotidine 10 MG tablet Commonly known as: PEPCID Place 1 tablet (10 mg total) into feeding tube daily.   feeding supplement (NEPRO CARB STEADY) Liqd Take 1,000 mLs by mouth continuous.   feeding supplement (PRO-STAT SUGAR FREE 64) Liqd Place 60 mLs into feeding tube 3 (three) times daily.   Gerhardt's  butt cream Crea Apply 1 application topically 2 (two) times daily.   hydrocortisone 2.5 % rectal cream Commonly known as: ANUSOL-HC Place rectally 2 (two) times daily.   hydrocortisone 25 MG suppository Commonly known as: ANUSOL-HC Place 1 suppository (25 mg total) rectally 2 (two) times daily.   hydrocortisone cream 1 % Apply topically 2 (two) times daily.   hydrOXYzine 10 MG/5ML syrup Commonly known as: ATARAX Place 5 mLs (10 mg total) into feeding tube every 6 (six) hours as needed for itching.   hyoscyamine 0.125 MG SL tablet Commonly known as: LEVSIN SL Place 1 tablet (0.125 mg total) under the tongue every 6 (six) hours as needed (give first for bladder spasms).   insulin aspart 100 UNIT/ML injection Commonly known as: novoLOG Inject 0-15 Units into the skin 3 (three) times daily with meals.   insulin aspart 100 UNIT/ML injection Commonly known as: novoLOG Inject 0-5 Units into the skin at bedtime.   levalbuterol 0.63 MG/3ML nebulizer solution Commonly known as: XOPENEX Take 3 mLs (0.63 mg total) by nebulization every 6 (six) hours as needed for wheezing or shortness of breath.   lidocaine (PF) 1 % Soln injection Commonly known as: XYLOCAINE Inject 5 mLs into the skin as needed (topical anesthesia for hemodialysis if GEBAUERS is ineffective.).   lidocaine-prilocaine cream Commonly known as: EMLA Apply 1 application topically as needed (topical anesthesia for hemodialysis if Gebauers and Lidocaine injection are ineffective.).   loperamide HCl 1 MG/7.5ML suspension Commonly known as: IMODIUM Place 30 mLs (4 mg total) into feeding tube as needed for diarrhea or loose stools.   magic mouthwash Soln Take 5 mLs by mouth 3 (three) times daily.   metoCLOPramide 5 MG tablet Commonly known as: REGLAN Take 1 tablet (5 mg total) by mouth 3 (three) times daily before meals.   mouth rinse Liqd solution 15 mLs by Mouth Rinse route 2 times daily at 12 noon and 4 pm.    multivitamin Tabs tablet Place 1 tablet into feeding tube at bedtime.   ondansetron 4 MG/2ML Soln injection Commonly known as: ZOFRAN Inject 2 mLs (4 mg total) into the vein every 6 (six) hours as needed for nausea or vomiting.   opium-belladonna 16.2-60 MG suppository Commonly known as: B&O SUPPRETTES Place 1 suppository rectally every 8 (eight) hours as needed for bladder spasms.   oxyCODONE 5 MG/5ML solution Commonly known as: ROXICODONE Place 5 mLs (5 mg total) into feeding tube every 6 (six) hours as needed for severe pain.   Resource ThickenUp Clear Powd Take 120 g by mouth as needed.   simethicone 80  MG chewable tablet Commonly known as: MYLICON Chew 1 tablet (80 mg total) by mouth 3 (three) times daily.         The patient has been discharged on:   1.Beta Blocker:  Yes [   ]                              No   [ n  ]                              If No, reason:  2.Ace Inhibitor/ARB: Yes [   ]                                     No  [  n  ]                                     If No, reason:  3.Statin:   Yes [   ]                  No  [ y  ]                  If No, reason:  4.Ecasa:  Yes  [ y  ]                  No   [   ]                  If No, reason:  Medications have been managed per the AHF team after long hospital  Coarse with multiple medication adjustments.   Signed: Nani Skillern PA-C 05/22/2019, 9:41 AM   patient examined and medical record reviewed,agree with above note. Tharon Aquas Trigt III 05/28/2019

## 2019-04-20 NOTE — Progress Notes (Signed)
Orthopedic Tech Progress Note Patient Details:  Jesse Murphy April 29, 1928 [id number]  Patient ID: [name], male   DOB: March 27, 1928, 86 y.o.   MRN: [id number] I spoke with the RN and she said the unna boots had been canceled and the pt didn't need them applied.  Karolee Stamps 04/20/2019, 10:36 PM

## 2019-04-20 NOTE — Progress Notes (Addendum)
[facility] Progress Note    Assessment/ Plan:   1. AKI(Cr was 0.7 prior to surgery)- due to postoperative CHF/cardiogenic shock that was refractory to diuretics and started on CRRT on 03/29/19 for volume removal. CRRT off on 2/23 - discussion with advanced CHF. CRRT restarted 2/24 then off after clotting. First HD on 2/26 with minimal UF and note increased pressors and rapid afib 1. We had to restart CRRT on 3/1-3/4; needs to be on citrate. 2. Had been on Toleratingnet UF goal to 100; CVP currently 10. 3. On Levo 6 mcgat this time, tolerating down titration. 4. D/w Dr. Haroldine Laws, if no response to Lasix 11m x1 then will restart CRRT through Sun or mon and then pull the catheter for a line holiday. WBC is 9.1 but febrile (tmax 100.9); possibly tunneled cath next week. He's in for a prolonged course if he recovers.  Update : only 40 mL of UOP with the Lasix 1690m therefore, will restart CRRT. 2. Acute systolic CHF/cardiogenic shock- EF 25-30%; s/p Impella- now out. optimize volume as above. On midodrine - note increase to 15 mg TID. 3. CAD s/p [id number]/98omplicated by cardiogenic shock and ARF.Postop echo EF 25-30% 4. Acute hypoxic respiratory failure with pseudomonas pneumonia- s/p trach on 03/27/19.s/pabx per PCCM-> completed Meropenem. 5. PAF- s/p MAZE and LAA occlusion- therapy per CHF team 6. Hematuria- bladder irrigationper urology recommendations. 7. ABLA- Hx Melena. Transfuse as needed. Have given RRBC's on 2/22 and 2/23. And 2/25. Gave aranesp 2/23 40 mcg andthen60 mcg on3/2  8. Protein malnutrition- moderate to severe 9. Secondary hyperparathyroidism/bone mineral - stopped the  renvela.  10. Disposition- Am concerned that he is a poor longterm dialysis candidate given his cardiogenic shock and trach.  Subjective:   Dyspnea is better, denies f/c/n/v Levo 6 mcg   Objective:   BP (!) 96/49   Pulse 81   Temp 98.5 F (36.9 C)  (Axillary)   Resp 19   Ht _0  (1.727 m)   Wt 87 kg   SpO2 (!) 87%   BMI 29.16 kg/m   Intake/Output Summary (Last 24 hours) at 04/20/2019 [id number] data filed at 04/20/2019 0800 Gross per 24 hour  Intake 3084.54 ml  Output 2581 ml  Net 503.54 ml   Weight change: 1.2 kg  Physical Exam: Gen: elderly male in bed chronically ill-appearing HEENT trach in place; on vent  CVS: S1 S2 no rub Resp:clear;unlabored at rest; trach collar Abd: soft/ NT/ND  Ext: 1+edema, TEDS stockings on Neuro - awake and interactive and follows commands, nods Psych - no anxiety or agitation  GU foley in place withirrigation Access: left chest nontunneled SCV dialysis catheter   Imaging: DG CHEST PORT 1 VIEW  Result Date: 04/20/2019 CLINICAL DATA:  [age]ear old male status post tracheostomy. Respiratory failure. History of CABG. EXAM: PORTABLE CHEST 1 VIEW COMPARISON:  Chest x-ray 04/19/2019. FINDINGS: A tracheostomy tube is in place with tip 7.9 cm above the carina. Left subclavian central venous catheter with tip terminating in the distal superior vena cava. There is a right upper extremity PICC with tip terminating in the superior cavoatrial junction. Bibasilar opacities (left greater than right), which may reflect areas of atelectasis and/or consolidation. Small right and small to moderate left pleural effusions. No pneumothorax. No evidence of pulmonary edema. Heart size appears borderline enlarged. Upper mediastinal contours are within normal limits. Aortic atherosclerosis. Status post median sternotomy for CABG. Left atrial appendage ligation clip noted. Surgical clips projecting over the right subclavian  region. IMPRESSION: 1. Support apparatus and postoperative changes, as above. 2. Bibasilar opacities (left greater than right), which may reflect areas of atelectasis and/or consolidation. Small right and small to moderate left pleural effusions. 3. Aortic atherosclerosis. Electronically Signed   By:  Vinnie Langton M.D.   On: 04/20/2019 09:01   DG Chest Port 1 View  Result Date: 04/19/2019 CLINICAL DATA:  Respiratory failure. EXAM: PORTABLE CHEST 1 VIEW COMPARISON:  One-view chest x-ray 04/18/2019 FINDINGS: Heart is enlarged. Atherosclerotic changes are noted at the aortic arch. Tracheostomy tube is stable. Right-sided PICC line is stable. Left subclavian catheter is stable. Bilateral pleural effusions and airspace disease are again noted, left greater than right. Aeration is slightly improved. IMPRESSION: Slightly improved aeration with persistent bilateral pleural effusions and airspace disease, left greater than right. Electronically Signed   By: San Morelle M.D.   On: 04/19/2019 06:46    Labs: BMET Recent Labs  Lab 04/16/19 1830 04/16/19 2010 04/17/19 0450 04/17/19 0753 04/17/19 1744 04/17/19 1957 04/18/19 0356 04/18/19 0402 04/19/19 0401 04/19/19 0402 04/19/19 1242 04/19/19 1444 04/19/19 1445 04/19/19 1647 04/19/19 1649 04/19/19 1754 04/20/19 0530  NA 135   < > 132*   < > 137   < > 125*   < > 139   < > 138 138 130* 139 132* 135 138  K 4.0   < > 3.6   < > 3.9   < > 3.3*   < > 4.0   < > 3.7 3.6 3.5 3.6 3.4* 3.5 4.0  CL 98   < > 93*   < > 95*   < > 82*   < > 90*   < > 82* 81* 80* 81* 80* 82* 85*  CO2 24  --  27  --  29  --  29  --  37*  --   --   --   --   --   --  38* 41*  GLUCOSE 198*   < > 271*   < > 135*   < > 446*   < > 127*   < > 257* 221* 286* 245* 285* 286* 153*  BUN 42*   < > 28*   < > 22   < > 20   < > 21   < > _0 24* 24* 45*  CREATININE 3.56*   < > 2.51*   < > 2.02*   < > 1.73*   < > 1.63*   < > 1.30* 1.40* 1.50* 1.30* 1.50* 1.65* 2.46*  CALCIUM 7.1*  --  10.8*  --  7.2*  --  >15.0*  --  9.5  --   --   --   --   --   --  14.1* 8.6*  PHOS 4.6  --  2.9  --  2.0*  --  1.7*  --  2.0*  --   --   --   --   --   --  1.4* 1.8*   < > = values in this interval not displayed.   CBC Recent Labs  Lab 04/17/19 0450 04/17/19 0753 04/18/19 0356  04/18/19 0402 04/19/19 0401 04/19/19 0402 04/19/19 1445 04/19/19 1647 04/19/19 1649 04/20/19 0530  WBC 9.1  --  9.1  --  11.8*  --   --   --   --  9.1  NEUTROABS  --   --   --   --  9.2*  --   --   --   --   --  HGB 8.4*   < > 8.0*   < > 8.5*   < > 9.5* 10.9* 9.2* 8.6*  HCT 26.1*   < > 26.2*   < > 28.1*   < > 28.0* 32.0* 27.0* 27.9*  MCV 92.2  --  95.6  --  96.6  --   --   --   --  94.3  PLT 224  --  237  --  255  --   --   --   --  216   < > = values in this interval not displayed.    Medications:    . vitamin C  500 mg Oral TID  . aspirin  81 mg Oral Daily  . atorvastatin  80 mg Oral q1800  . chlorhexidine gluconate (MEDLINE KIT)  15 mL Mouth Rinse BID  . Chlorhexidine Gluconate Cloth  6 each Topical Q0600  . Chlorhexidine Gluconate Cloth  6 each Topical Q0600  . darbepoetin (ARANESP) injection - NON-DIALYSIS  60 mcg Subcutaneous Q Tue-1800  . feeding supplement (NEPRO CARB STEADY)  237 mL Oral TID BM  . feeding supplement (PRO-STAT SUGAR FREE 64)  60 mL Per Tube BID  . Gerhardt's butt cream   Topical BID  . hydrocortisone   Rectal BID  . insulin aspart  0-24 Units Subcutaneous Q4H  . insulin glargine  30 Units Subcutaneous Daily  . mouth rinse  15 mL Mouth Rinse 10 times per day  . metoCLOPramide (REGLAN) injection  10 mg Intravenous Q6H  . midodrine  15 mg Oral TID WC  . multivitamin  1 tablet Oral QHS  . pantoprazole (PROTONIX) IV  40 mg Intravenous Q24H  . simethicone  80 mg Oral BID PC  . sodium chloride flush  10-40 mL Intracatheter Q12H  . thiamine  250 mg Per Tube Daily  . vitamin B-12  1,000 mcg Per Tube Daily      Otelia Santee, MD 04/20/2019, 9:33 AM

## 2019-04-21 ENCOUNTER — Inpatient Hospital Stay (HOSPITAL_COMMUNITY): Payer: Medicare Other

## 2019-04-21 LAB — POCT I-STAT, CHEM 8
BUN: 28 mg/dL — ABNORMAL HIGH (ref 8–23)
BUN: 30 mg/dL — ABNORMAL HIGH (ref 8–23)
BUN: 32 mg/dL — ABNORMAL HIGH (ref 8–23)
BUN: 33 mg/dL — ABNORMAL HIGH (ref 8–23)
BUN: 34 mg/dL — ABNORMAL HIGH (ref 8–23)
BUN: 36 mg/dL — ABNORMAL HIGH (ref 8–23)
BUN: 38 mg/dL — ABNORMAL HIGH (ref 8–23)
BUN: 39 mg/dL — ABNORMAL HIGH (ref 8–23)
BUN: 39 mg/dL — ABNORMAL HIGH (ref 8–23)
BUN: 43 mg/dL — ABNORMAL HIGH (ref 8–23)
BUN: 43 mg/dL — ABNORMAL HIGH (ref 8–23)
BUN: 48 mg/dL — ABNORMAL HIGH (ref 8–23)
BUN: 53 mg/dL — ABNORMAL HIGH (ref 8–23)
Calcium, Ion: 0.36 mmol/L — CL (ref 1.15–1.40)
Calcium, Ion: 0.39 mmol/L — CL (ref 1.15–1.40)
Calcium, Ion: 0.39 mmol/L — CL (ref 1.15–1.40)
Calcium, Ion: 0.41 mmol/L — CL (ref 1.15–1.40)
Calcium, Ion: 0.44 mmol/L — CL (ref 1.15–1.40)
Calcium, Ion: 0.45 mmol/L — CL (ref 1.15–1.40)
Calcium, Ion: 0.48 mmol/L — CL (ref 1.15–1.40)
Calcium, Ion: 1.05 mmol/L — ABNORMAL LOW (ref 1.15–1.40)
Calcium, Ion: 1.08 mmol/L — ABNORMAL LOW (ref 1.15–1.40)
Calcium, Ion: 1.13 mmol/L — ABNORMAL LOW (ref 1.15–1.40)
Calcium, Ion: 1.23 mmol/L (ref 1.15–1.40)
Calcium, Ion: 1.26 mmol/L (ref 1.15–1.40)
Calcium, Ion: 1.28 mmol/L (ref 1.15–1.40)
Chloride: 85 mmol/L — ABNORMAL LOW (ref 98–111)
Chloride: 85 mmol/L — ABNORMAL LOW (ref 98–111)
Chloride: 86 mmol/L — ABNORMAL LOW (ref 98–111)
Chloride: 86 mmol/L — ABNORMAL LOW (ref 98–111)
Chloride: 87 mmol/L — ABNORMAL LOW (ref 98–111)
Chloride: 87 mmol/L — ABNORMAL LOW (ref 98–111)
Chloride: 87 mmol/L — ABNORMAL LOW (ref 98–111)
Chloride: 89 mmol/L — ABNORMAL LOW (ref 98–111)
Chloride: 89 mmol/L — ABNORMAL LOW (ref 98–111)
Chloride: 89 mmol/L — ABNORMAL LOW (ref 98–111)
Chloride: 90 mmol/L — ABNORMAL LOW (ref 98–111)
Chloride: 90 mmol/L — ABNORMAL LOW (ref 98–111)
Chloride: 91 mmol/L — ABNORMAL LOW (ref 98–111)
Creatinine, Ser: 1.5 mg/dL — ABNORMAL HIGH (ref 0.61–1.24)
Creatinine, Ser: 1.6 mg/dL — ABNORMAL HIGH (ref 0.61–1.24)
Creatinine, Ser: 1.6 mg/dL — ABNORMAL HIGH (ref 0.61–1.24)
Creatinine, Ser: 1.7 mg/dL — ABNORMAL HIGH (ref 0.61–1.24)
Creatinine, Ser: 1.8 mg/dL — ABNORMAL HIGH (ref 0.61–1.24)
Creatinine, Ser: 1.9 mg/dL — ABNORMAL HIGH (ref 0.61–1.24)
Creatinine, Ser: 1.9 mg/dL — ABNORMAL HIGH (ref 0.61–1.24)
Creatinine, Ser: 1.9 mg/dL — ABNORMAL HIGH (ref 0.61–1.24)
Creatinine, Ser: 2 mg/dL — ABNORMAL HIGH (ref 0.61–1.24)
Creatinine, Ser: 2.2 mg/dL — ABNORMAL HIGH (ref 0.61–1.24)
Creatinine, Ser: 2.2 mg/dL — ABNORMAL HIGH (ref 0.61–1.24)
Creatinine, Ser: 2.4 mg/dL — ABNORMAL HIGH (ref 0.61–1.24)
Creatinine, Ser: 2.5 mg/dL — ABNORMAL HIGH (ref 0.61–1.24)
Glucose, Bld: 154 mg/dL — ABNORMAL HIGH (ref 70–99)
Glucose, Bld: 171 mg/dL — ABNORMAL HIGH (ref 70–99)
Glucose, Bld: 175 mg/dL — ABNORMAL HIGH (ref 70–99)
Glucose, Bld: 183 mg/dL — ABNORMAL HIGH (ref 70–99)
Glucose, Bld: 185 mg/dL — ABNORMAL HIGH (ref 70–99)
Glucose, Bld: 186 mg/dL — ABNORMAL HIGH (ref 70–99)
Glucose, Bld: 188 mg/dL — ABNORMAL HIGH (ref 70–99)
Glucose, Bld: 192 mg/dL — ABNORMAL HIGH (ref 70–99)
Glucose, Bld: 196 mg/dL — ABNORMAL HIGH (ref 70–99)
Glucose, Bld: 200 mg/dL — ABNORMAL HIGH (ref 70–99)
Glucose, Bld: 206 mg/dL — ABNORMAL HIGH (ref 70–99)
Glucose, Bld: 220 mg/dL — ABNORMAL HIGH (ref 70–99)
Glucose, Bld: 238 mg/dL — ABNORMAL HIGH (ref 70–99)
HCT: 23 % — ABNORMAL LOW (ref 39.0–52.0)
HCT: 25 % — ABNORMAL LOW (ref 39.0–52.0)
HCT: 25 % — ABNORMAL LOW (ref 39.0–52.0)
HCT: 25 % — ABNORMAL LOW (ref 39.0–52.0)
HCT: 26 % — ABNORMAL LOW (ref 39.0–52.0)
HCT: 26 % — ABNORMAL LOW (ref 39.0–52.0)
HCT: 27 % — ABNORMAL LOW (ref 39.0–52.0)
HCT: 27 % — ABNORMAL LOW (ref 39.0–52.0)
HCT: 28 % — ABNORMAL LOW (ref 39.0–52.0)
HCT: 28 % — ABNORMAL LOW (ref 39.0–52.0)
HCT: 29 % — ABNORMAL LOW (ref 39.0–52.0)
HCT: 29 % — ABNORMAL LOW (ref 39.0–52.0)
HCT: 37 % — ABNORMAL LOW (ref 39.0–52.0)
Hemoglobin: 12.6 g/dL — ABNORMAL LOW (ref 13.0–17.0)
Hemoglobin: 7.8 g/dL — ABNORMAL LOW (ref 13.0–17.0)
Hemoglobin: 8.5 g/dL — ABNORMAL LOW (ref 13.0–17.0)
Hemoglobin: 8.5 g/dL — ABNORMAL LOW (ref 13.0–17.0)
Hemoglobin: 8.5 g/dL — ABNORMAL LOW (ref 13.0–17.0)
Hemoglobin: 8.8 g/dL — ABNORMAL LOW (ref 13.0–17.0)
Hemoglobin: 8.8 g/dL — ABNORMAL LOW (ref 13.0–17.0)
Hemoglobin: 9.2 g/dL — ABNORMAL LOW (ref 13.0–17.0)
Hemoglobin: 9.2 g/dL — ABNORMAL LOW (ref 13.0–17.0)
Hemoglobin: 9.5 g/dL — ABNORMAL LOW (ref 13.0–17.0)
Hemoglobin: 9.5 g/dL — ABNORMAL LOW (ref 13.0–17.0)
Hemoglobin: 9.9 g/dL — ABNORMAL LOW (ref 13.0–17.0)
Hemoglobin: 9.9 g/dL — ABNORMAL LOW (ref 13.0–17.0)
Potassium: 3.8 mmol/L (ref 3.5–5.1)
Potassium: 3.8 mmol/L (ref 3.5–5.1)
Potassium: 3.8 mmol/L (ref 3.5–5.1)
Potassium: 3.8 mmol/L (ref 3.5–5.1)
Potassium: 3.9 mmol/L (ref 3.5–5.1)
Potassium: 3.9 mmol/L (ref 3.5–5.1)
Potassium: 4 mmol/L (ref 3.5–5.1)
Potassium: 4 mmol/L (ref 3.5–5.1)
Potassium: 4 mmol/L (ref 3.5–5.1)
Potassium: 4.1 mmol/L (ref 3.5–5.1)
Potassium: 4.1 mmol/L (ref 3.5–5.1)
Potassium: 4.1 mmol/L (ref 3.5–5.1)
Potassium: 4.2 mmol/L (ref 3.5–5.1)
Sodium: 130 mmol/L — ABNORMAL LOW (ref 135–145)
Sodium: 131 mmol/L — ABNORMAL LOW (ref 135–145)
Sodium: 131 mmol/L — ABNORMAL LOW (ref 135–145)
Sodium: 132 mmol/L — ABNORMAL LOW (ref 135–145)
Sodium: 133 mmol/L — ABNORMAL LOW (ref 135–145)
Sodium: 133 mmol/L — ABNORMAL LOW (ref 135–145)
Sodium: 136 mmol/L (ref 135–145)
Sodium: 137 mmol/L (ref 135–145)
Sodium: 137 mmol/L (ref 135–145)
Sodium: 137 mmol/L (ref 135–145)
Sodium: 137 mmol/L (ref 135–145)
Sodium: 138 mmol/L (ref 135–145)
Sodium: 138 mmol/L (ref 135–145)
TCO2: 33 mmol/L — ABNORMAL HIGH (ref 22–32)
TCO2: 33 mmol/L — ABNORMAL HIGH (ref 22–32)
TCO2: 33 mmol/L — ABNORMAL HIGH (ref 22–32)
TCO2: 34 mmol/L — ABNORMAL HIGH (ref 22–32)
TCO2: 34 mmol/L — ABNORMAL HIGH (ref 22–32)
TCO2: 34 mmol/L — ABNORMAL HIGH (ref 22–32)
TCO2: 34 mmol/L — ABNORMAL HIGH (ref 22–32)
TCO2: 36 mmol/L — ABNORMAL HIGH (ref 22–32)
TCO2: 36 mmol/L — ABNORMAL HIGH (ref 22–32)
TCO2: 36 mmol/L — ABNORMAL HIGH (ref 22–32)
TCO2: 37 mmol/L — ABNORMAL HIGH (ref 22–32)
TCO2: 37 mmol/L — ABNORMAL HIGH (ref 22–32)
TCO2: 37 mmol/L — ABNORMAL HIGH (ref 22–32)

## 2019-04-21 LAB — RENAL FUNCTION PANEL
Albumin: 2.2 g/dL — ABNORMAL LOW (ref 3.5–5.0)
Albumin: 2.5 g/dL — ABNORMAL LOW (ref 3.5–5.0)
Anion gap: 12 (ref 5–15)
Anion gap: 12 (ref 5–15)
BUN: 45 mg/dL — ABNORMAL HIGH (ref 8–23)
BUN: 36 mg/dL — ABNORMAL HIGH (ref 8–23)
CO2: 41 mmol/L — ABNORMAL HIGH (ref 22–32)
CO2: 33 mmol/L — ABNORMAL HIGH (ref 22–32)
Calcium: 8.6 mg/dL — ABNORMAL LOW (ref 8.9–10.3)
Calcium: 10 mg/dL (ref 8.9–10.3)
Chloride: 85 mmol/L — ABNORMAL LOW (ref 98–111)
Chloride: 90 mmol/L — ABNORMAL LOW (ref 98–111)
Creatinine, Ser: 2.46 mg/dL — ABNORMAL HIGH (ref 0.61–1.24)
Creatinine, Ser: 1.95 mg/dL — ABNORMAL HIGH (ref 0.61–1.24)
GFR calc Af Amer: 26 mL/min — ABNORMAL LOW (ref >60)
GFR calc Af Amer: 35 mL/min — ABNORMAL LOW (ref >60)
GFR calc non Af Amer: 23 mL/min — ABNORMAL LOW (ref >60)
GFR calc non Af Amer: 30 mL/min — ABNORMAL LOW (ref >60)
Glucose, Bld: 153 mg/dL — ABNORMAL HIGH (ref 70–99)
Glucose, Bld: 264 mg/dL — ABNORMAL HIGH (ref 70–99)
Phosphorus: 1.8 mg/dL — ABNORMAL LOW (ref 2.5–4.6)
Phosphorus: 1.3 mg/dL — ABNORMAL LOW (ref 2.5–4.6)
Potassium: 4 mmol/L (ref 3.5–5.1)
Potassium: 4.3 mmol/L (ref 3.5–5.1)
Sodium: 138 mmol/L (ref 135–145)
Sodium: 135 mmol/L (ref 135–145)

## 2019-04-21 LAB — COMPREHENSIVE METABOLIC PANEL
ALT: 9 U/L (ref 0–44)
AST: 19 U/L (ref 15–41)
Albumin: 2.3 g/dL — ABNORMAL LOW (ref 3.5–5.0)
Alkaline Phosphatase: 60 U/L (ref 38–126)
Anion gap: 12 (ref 5–15)
BUN: 48 mg/dL — ABNORMAL HIGH (ref 8–23)
CO2: 34 mmol/L — ABNORMAL HIGH (ref 22–32)
Calcium: 11.3 mg/dL — ABNORMAL HIGH (ref 8.9–10.3)
Chloride: 87 mmol/L — ABNORMAL LOW (ref 98–111)
Creatinine, Ser: 2.39 mg/dL — ABNORMAL HIGH (ref 0.61–1.24)
GFR calc Af Amer: 27 mL/min — ABNORMAL LOW (ref >60)
GFR calc non Af Amer: 23 mL/min — ABNORMAL LOW (ref >60)
Glucose, Bld: 238 mg/dL — ABNORMAL HIGH (ref 70–99)
Potassium: 3.9 mmol/L (ref 3.5–5.1)
Sodium: 133 mmol/L — ABNORMAL LOW (ref 135–145)
Total Bilirubin: 0.7 mg/dL (ref 0.3–1.2)
Total Protein: 5.1 g/dL — ABNORMAL LOW (ref 6.5–8.1)

## 2019-04-21 LAB — GLUCOSE, CAPILLARY
Glucose-Capillary: 187 mg/dL — ABNORMAL HIGH (ref 70–99)
Glucose-Capillary: 152 mg/dL — ABNORMAL HIGH (ref 70–99)
Glucose-Capillary: 139 mg/dL — ABNORMAL HIGH (ref 70–99)
Glucose-Capillary: 144 mg/dL — ABNORMAL HIGH (ref 70–99)
Glucose-Capillary: 222 mg/dL — ABNORMAL HIGH (ref 70–99)

## 2019-04-21 LAB — CBC
HCT: 26.3 % — ABNORMAL LOW (ref 39.0–52.0)
Hemoglobin: 8.4 g/dL — ABNORMAL LOW (ref 13.0–17.0)
MCH: 30.7 pg (ref 26.0–34.0)
MCHC: 31.9 g/dL (ref 30.0–36.0)
MCV: 96 fL (ref 80.0–100.0)
Platelets: 196 10*3/uL (ref 150–400)
RBC: 2.74 MIL/uL — ABNORMAL LOW (ref 4.22–5.81)
RDW: 16.3 % — ABNORMAL HIGH (ref 11.5–15.5)
WBC: 8 10*3/uL (ref 4.0–10.5)
nRBC: 0 % (ref 0.0–0.2)

## 2019-04-21 LAB — MAGNESIUM: Magnesium: 1.9 mg/dL (ref 1.7–2.4)

## 2019-04-21 LAB — PHOSPHORUS: Phosphorus: 1.6 mg/dL — ABNORMAL LOW (ref 2.5–4.6)

## 2019-04-21 MED ORDER — SODIUM CHLORIDE 0.9% FLUSH: 10.0000 mL | INTRAVENOUS | Status: DC | PRN

## 2019-04-21 MED ORDER — INSULIN GLARGINE 100 UNIT/ML ~~LOC~~ SOLN
36.0000 [IU] | Freq: Every day | SUBCUTANEOUS | Status: DC
  Administered 2019-04-21 – 2019-04-22 (×2): 36 [IU] via SUBCUTANEOUS
  Filled 2019-04-21 (×3): qty 0.36

## 2019-04-21 MED ORDER — INSULIN GLARGINE 100 UNIT/ML ~~LOC~~ SOLN
32.0000 [IU] | Freq: Every day | SUBCUTANEOUS | Status: DC
  Filled 2019-04-21: qty 0.32

## 2019-04-21 MED ORDER — ALBUMIN HUMAN 25 % IV SOLN
12.5000 g | Freq: Four times a day (QID) | INTRAVENOUS | Status: AC
  Administered 2019-04-21 (×3): 12.5 g via INTRAVENOUS
  Filled 2019-04-21 (×3): qty 50

## 2019-04-21 NOTE — Progress Notes (Signed)
NAME:  Jesse Murphy, MRN:  [id number], DOB:  1928-07-24, LOS: 53 ADMISSION DATE:  03/13/2019, CONSULTATION DATE:  2/2 REFERRING MD:  Nils Pyle, CHIEF COMPLAINT:  Dyspnea   Brief History   86 y/o male admitted 1/26, underwent CABG and L atrial appendage clipping on 1/29, moved to ICU on 2/1 for intubation.  Past Medical History  A fib, CAD, Prostate cancer, HTN  Significant Hospital Events   1/29 CABG 2/01 to ICU 2/05 Extubated 2/08 Reintubated 2/09 tracheostomy 2/09 Impella, PA catheter placed 2/11 CRRT initiated 2/16 leukocytosis, add vancomycin 2/17 start TC trials 2/19 urology placed 3 way foley, bleeding at rectal tube site 2/21 ATC initiated  2/22 1 unit PRBC 2/23 1 unit PRBC. Impella removed 2/25 ATC trach collar. 3/1 Converted to A fib in HD 3/1 Started CVVHD, net negative 50 cc per hour, on Levo at 14 3/4: CVVH d/c'd 3/5: Down Sized to #4 Cuff less trach; CRRT resumed  Consults:  Nephrology CHF team Urology  Procedures:  Rt PICC 2/02 >> Trach 2/09 >> PA catheter 2/09 > 2/12  Lt [place] HD cath 2/11 >> L Rad [place] 2/3 > 2/20  Impella 2/10 > 2/23  Significant Diagnostic Tests:  Echo 1/26 > EF 30 to 35% Echo 2/09 > EF 50-55%  Micro Data:  SARS CoV2 PCR 1/26 > negative Influenza PCR 1/26 > negative Sputum 2/01 > oral flora Sputum 2/08 > Pseudomonas >> pan sensitive  Blood 2/16 > negative Sputum 2/16 > Pseudomonas sens to cipro, gent, imepenem Resp 2/20 > PA sens cipro, gent, impipenem BCx2 2/20 > neg  Antimicrobials:  Rocephin 2/01 > 2/07 Cefepime 2/08 > 2/10 Fortaz 2/10 > 2/16 Vancomycin 2/16 > 2/19 Meropenem 2/20 > 2/26  Interim history/subjective:  .  No acute distress  Objective   Blood pressure (Abnormal) 99/52, pulse 64, temperature 98.3 F (36.8 C), temperature source Oral, resp. rate 13, height 5\' 8"  (1.727 m), weight 87 kg, SpO2 100 %. CVP:  [4 mmHg-13 mmHg] 4 mmHg  FiO2 (%):  [40 %-60 %] 60 %   Intake/Output Summary (Last 24 hours)  at 04/21/2019 0747 Last data filed at 04/21/2019 0700 Gross per 24 hour  Intake 2073.08 ml  Output 2219 ml  Net -145.92 ml   Filed Weights   04/18/19 0500 04/19/19 0500 04/20/19 0500  Weight: 89.4 kg 85.8 kg 87 kg   Physical Exam:  General this is a 86 year old white male he is resting in bed he is in no acute distress this morning HEENT normocephalic atraumatic no jugular venous distention appreciated.  Has a size 4 cuffless tracheostomy in place currently on 40% humidified trach collar, excellent phonation with Passy-Muir valve in place Pulmonary: Diminished bases, mild wheeze left greater than right no accessory use Cardiac: Regular irregular, right staples on anterior chest intact Abdomen: Soft nontender Extremities: Warm, pulses are palpable, capillary refill brisk, trace lower extremity edema Neuro: Awake oriented no focal deficits appreciated  GU: Continuous bladder irrigation in place  Resolved Hospital Problem list   Pseudomonas HCAP s/p completion meropenem Acute Hypoxemic Respiratory Failure due to acute pulmonary edema, pneumonia. 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  There is left greater than right pleural effusion some interstitial airspace disease bilaterally  PICC and HD catheter in satisfactory position, correlating this with CT scan on 3/5 demonstrating essentially the same, bilateral left greater than right effusions, what appears to be pulmonary edema bilaterally, with some moderate basilar airway thickening Plan  Continue aerosol trach collar  Titrate FiO2  Mobilization  Follow-up cultures Encourage PMV with ongoing speech therapy Volume removal  via CRRT  New low grade fever 3/5 ( 100.3) No WBC climb Plan Sputum sent on 3/5 currently growing GNR and few GPC; will follow   Cardiogenic Shock status post CABG and L atrial appendage clipping  with acute systolic heart failure. Atrial fibrillation s/p MAZE and LAA occlusion   -s/p Impella removal 2/23.  -EF 50-55% -Relative adrenal insufficiency ruled out Plan Continue to titrate vasopressors for map goal of greater than 65  Midodrine initiated  Continuing amiodarone drip  Persistent Hematuria with retained clots - hematuria clearing -3/3 noted with gross hematuria  Plan Bladder irrigations as directed by urology  AKI secondary to Cardiogenic Shock -CRRT d/c'd 3/4 Plan Serial chemistries Treat shock Strict intake output Renal adjust medications as indicated  Anemia of critical illness. Some flexiseal trauma and hematuria as above HGB drop from 9.2 to 8.6>> ? Hemodilutional; holding around here  plan Continue to trend CBC Heparin drips been on hold Transfuse for hemoglobin less than 7  DM2 with hyperglycemia, poorly controlled: Glucose still above 180 on serial checks Plan Continuing sliding scale Increase Lantus to 32 units May need to consider mealtime coverage  Pressure wounds / Reported as Skin Tear 2/15 Plan Continuing current wound care   Best practice:  Diet: Dysphagia 2 diet DVT prophylaxis: SCD's GI prophylaxis: PPI Mobility: bed rest Code Status: Partial Code  Disposition: ICU   My critical care x31 minutes  Erick Colace ACNP-BC [place] Pager # [phone] OR # [phone] if no answer

## 2019-04-21 NOTE — Progress Notes (Signed)
11 Days Post-Op Procedure(s) (LRB): REMOVAL OF IMPELLA LEFT VENTRICULAR ASSIST DEVICE WITH INSERTION OF RIGHT FEMORAL ARTERIAL LINE (N/A) Subjective: No nausea this am Fever resolved- on Rocephin for gram neg on tracheal aspirate nsr Making small amt urine - stop CBI and cont to hold heparin L pleural effusion on CXR will prob need L pleurx when he goes for Diatek catheter Objective: Vital signs in last 24 hours: Temp:  [98 F (36.7 C)-98.3 F (36.8 C)] 98.3 F (36.8 C) (03/06 0400) Pulse Rate:  [59-159] 72 (03/06 0900) Cardiac Rhythm: Normal sinus rhythm (03/06 0400) Resp:  [12-35] 18 (03/06 0900) BP: (73-118)/(35-82) 102/51 (03/06 0900) SpO2:  [72 %-100 %] 97 % (03/06 0900) FiO2 (%):  [40 %-60 %] 40 % (03/06 0730)  Hemodynamic parameters for last 24 hours: CVP:  [4 mmHg-12 mmHg] 11 mmHg  Intake/Output from previous day: 03/05 0701 - 03/06 0700 In: 2073.1 [P.O.:477; I.V.:539.5; NG/GT:750; IV Piggyback:306.6] Out: 2219 [Urine:505] Intake/Output this shift: Total I/O In: 430.7 [P.O.:118; I.V.:42.7; NG/GT:270] Out: 224 [Other:224]       Exam    General- alert and comfortable    Neck- no JVD, no cervical adenopathy palpable, no carotid bruit   Lungs- clear without rales, wheezes   Cor- regular rate and rhythm, no murmur , gallop   Abdomen- soft, non-tender   Extremities - warm, non-tender, minimal edema   Neuro- oriented, appropriate, no focal weakness   Lab Results: Recent Labs    04/20/19 0530 04/20/19 2102 04/21/19 0250 04/21/19 0328 04/21/19 0630 04/21/19 0912  WBC 9.1  --  8.0  --   --   --   HGB 8.6*   < > 8.4*   < > 9.5* 9.9*  HCT 27.9*   < > 26.3*   < > 28.0* 29.0*  PLT 216  --  196  --   --   --    < > = values in this interval not displayed.   BMET:  Recent Labs    04/20/19 1652 04/20/19 2102 04/21/19 0313 04/21/19 0328 04/21/19 0630 04/21/19 0912  NA 135   < > 133*   < > 138 136  K 4.4   < > 3.9   < > 3.8 3.8  CL 83*   < > 87*   < > 87*  91*  CO2 39*  --  34*  --   --   --   GLUCOSE 155*   < > 238*   < > 186* 175*  BUN 63*   < > 48*   < > 32* 36*  CREATININE 3.14*   < > 2.39*   < > 1.90* 1.80*  CALCIUM 8.3*  --  11.3*  --   --   --    < > = values in this interval not displayed.    PT/INR: No results for input(s): LABPROT, INR in the last 72 hours. ABG    Component Value Date/Time   PHART 7.488 (H) 04/07/2019 0405   HCO3 25.9 04/07/2019 0405   TCO2 33 (H) 04/21/2019 0912   ACIDBASEDEF 1.0 04/05/2019 0540   O2SAT 67.8 04/14/2019 1253   CBG (last 3)  Recent Labs    04/20/19 2325 04/21/19 0331 04/21/19 0817  GLUCAP 216* 187* 152*    Assessment/Plan: S/P Procedure(s) (LRB): REMOVAL OF IMPELLA LEFT VENTRICULAR ASSIST DEVICE WITH INSERTION OF RIGHT FEMORAL ARTERIAL LINE (N/A) Mobilize remove 50-100 /hr on CRT   LOS: 38 days    Jesse Murphy  04/21/2019  

## 2019-04-21 NOTE — Progress Notes (Signed)
CT surgery p.m. Rounds  Patient resting comfortably on trach collar CRT removing 50-100 cc/h without increasing low-dose norepinephrine.  Maintaining sinus rhythm. Plan placement of left Pleurx catheter when patient has tunneled catheter placed next week by [facility].

## 2019-04-21 NOTE — Progress Notes (Signed)
Patient ID: DICK HARK, male   DOB: 1928-09-03, 86 y.o.   MRN: [id number]    Advanced Heart Failure Rounding Note  PCP-Cardiologist: Kate Sable, MD   Subjective:    Impella removed 2/23. Intraoperative TEE showed EF ~40%  Remains on TC with speaking valve in place.   CVVHD stopped 3/4. Given 160IV lasix yesterday without much effect. Now back on CVVHD pulling 50-100/hr. NE down to 5. Remains in NSR on IV amio. Weight up 2 pounds overnight.   Nausea improved. Denies SOB. + swelling  Objective:   Weight Range: 87 kg Body mass index is 29.16 kg/m.   Vital Signs:   Temp:  [98 F (36.7 C)-98.4 F (36.9 C)] 98.4 F (36.9 C) (03/06 0800) Pulse Rate:  [59-80] 73 (03/06 1045) Resp:  [12-35] 18 (03/06 1045) BP: (65-118)/(35-82) 108/46 (03/06 1045) SpO2:  [86 %-100 %] 95 % (03/06 1045) FiO2 (%):  [40 %-60 %] 40 % (03/06 0730) Last BM Date: 04/20/19  Weight change: Filed Weights   04/18/19 0500 04/19/19 0500 04/20/19 0500  Weight: 89.4 kg 85.8 kg 87 kg    Intake/Output:   Intake/Output Summary (Last 24 hours) at 04/21/2019 1059 Last data filed at 04/21/2019 1000 Gross per 24 hour  Intake 2051.96 ml  Output 2764 ml  Net -712.04 ml      Physical Exam   General:  Elderly male. Lying in bed on TC. No resp difficulty HEENT: normal Neck: supple. JVP 8-10. + TC Carotids 2+ bilat; no bruits. No lymphadenopathy or thryomegaly appreciated. Cor: PMI nondisplaced. Regular rate & rhythm. No rubs, gallops or murmurs. Lungs: decrease at bases Abdomen: soft, nontender, nondistended. No hepatosplenomegaly. No bruits or masses. Good bowel sounds. Extremities: no cyanosis, clubbing, rash, 2+ edema Neuro: alert & orientedx3, cranial nerves grossly intact. moves all 4 extremities w/o difficulty. Affect pleasant   Telemetry   NSR 70-80s Personally reviewed   Labs    CBC Recent Labs    04/19/19 0401 04/19/19 0402 04/20/19 0530 04/20/19 2102 04/21/19 0250  04/21/19 0328 04/21/19 0912 04/21/19 0924  WBC 11.8*   < > 9.1  --  8.0  --   --   --   NEUTROABS 9.2*  --   --   --   --   --   --   --   HGB 8.5*   < > 8.6*   < > 8.4*   < > 9.9* 12.6*  HCT 28.1*   < > 27.9*   < > 26.3*   < > 29.0* 37.0*  MCV 96.6   < > 94.3  --  96.0  --   --   --   PLT 255   < > 216  --  196  --   --   --    < > = values in this interval not displayed.   Basic Metabolic Panel Recent Labs    04/20/19 0530 04/20/19 0530 04/20/19 1652 04/20/19 2102 04/21/19 0313 04/21/19 0328 04/21/19 0912 04/21/19 0924  NA 138   < > 135   < > 133*   < > 136 132*  K 4.0   < > 4.4   < > 3.9   < > 3.8 3.8  CL 85*   < > 83*   < > 87*   < > 91* 89*  CO2 41*   < > 39*  --  34*  --   --   --   GLUCOSE 153*   < >  155*   < > 238*   < > 175* 206*  BUN 45*   < > 63*   < > 48*   < > 36* 39*  CREATININE 2.46*   < > 3.14*   < > 2.39*   < > 1.80* 2.20*  CALCIUM 8.6*   < > 8.3*  --  11.3*  --   --   --   MG 1.8  --   --   --  1.9  --   --   --   PHOS 1.8*   < > 2.2*  --  1.6*  --   --   --    < > = values in this interval not displayed.   Liver Function Tests Recent Labs    04/20/19 0530 04/20/19 0530 04/20/19 1652 04/21/19 0313  AST 23  --   --  19  ALT 10  --   --  9  ALKPHOS 64  --   --  60  BILITOT 0.8  --   --  0.7  PROT 4.9*  --   --  5.1*  ALBUMIN 2.2*  2.2*   < > 2.1* 2.3*   < > = values in this interval not displayed.   Recent Labs    04/20/19 0530  AMYLASE 19*   Cardiac Enzymes No results for input(s): CKTOTAL, CKMB, CKMBINDEX, TROPONINI in the last 72 hours.  BNP: BNP (last 3 results) Recent Labs    03/22/19 0401 03/26/19 0626 03/27/19 1255  BNP 340.4* 687.5* 800.4*    ProBNP (last 3 results) No results for input(s): PROBNP in the last 8760 hours.   D-Dimer No results for input(s): DDIMER in the last 72 hours. Hemoglobin A1C No results for input(s): HGBA1C in the last 72 hours. Fasting Lipid Panel No results for input(s): CHOL, HDL, LDLCALC,  TRIG, CHOLHDL, LDLDIRECT in the last 72 hours. Thyroid Function Tests No results for input(s): TSH, T4TOTAL, T3FREE, THYROIDAB in the last 72 hours.  Invalid input(s): FREET3  Other results:   Imaging    CT CHEST WO CONTRAST  Result Date: 04/20/2019 CLINICAL DATA:  Respiratory failure due to pneumonia and pulmonary edema post tracheostomy 03/27/2019. Evaluate bibasilar airspace opacities. Low-grade fever without leukocytosis. EXAM: CT CHEST WITHOUT CONTRAST TECHNIQUE: Multidetector CT imaging of the chest was performed following the standard protocol without IV contrast. COMPARISON:  Radiographs 04/20/2019 and 04/19/2019. Abdominal CT 04/13/2019, cardiac CT 01/23/2018 and PET-CT 10/31/2017. FINDINGS: Cardiovascular: Atherosclerosis of the aorta, great vessels and coronary arteries post median sternotomy, CABG and left atrial appendage clipping. Left subclavian central venous catheter extends to the upper SVC. There is a right arm PICC which extends to the lower SVC. The heart size is normal. There is minimal pericardial fluid. Mediastinum/Nodes: There are no enlarged mediastinal, hilar or axillary lymph nodes.Small mediastinal lymph nodes are likely chronic. Tracheostomy appears well position. A feeding tube projects beyond the distal stomach, tip not visualized. The thyroid gland, trachea and esophagus demonstrate no significant findings. Lungs/Pleura: Moderate size dependent pleural effusions bilaterally with associated dependent pulmonary airspace opacities, probably atelectasis. There is a dependent asymmetric component in the left upper lobe which could reflect inflammation. Underlying mild to moderate centrilobular emphysema. No pneumothorax. Upper abdomen: No acute findings are seen within the visualized upper abdomen. The gallbladder is contracted with possible small stones in the gallbladder neck. Musculoskeletal/Chest wall: There is no chest wall mass or suspicious osseous finding. Recent  median sternotomy with intact sternotomy wires and no  sternal dehiscence or surrounding suspicious soft tissue abnormality. Mild subcutaneous edema within the anterior chest wall. IMPRESSION: 1. Moderate-sized dependent pleural effusions with associated dependent pulmonary airspace opacities, probably atelectasis. Asymmetric dependent component in the left upper lobe could reflect inflammation. 2. Recent median sternotomy, CABG and left atrial appendage clipping. No evidence of sternal dehiscence or surrounding soft tissue abnormality. 3. Aortic Atherosclerosis (ICD10-I70.0) and Emphysema (ICD10-J43.9). Electronically Signed   By: Richardean Sale M.D.   On: 04/20/2019 13:36   DG CHEST PORT 1 VIEW  Result Date: 04/21/2019 CLINICAL DATA:  86 year old male with history of tachycardia. EXAM: PORTABLE CHEST 1 VIEW COMPARISON:  Chest x-ray 04/20/2019. FINDINGS: A tracheostomy tube is in place with tip 8.4 cm above the carina. There is a right upper extremity PICC with tip terminating in the distal superior vena cava. A feeding tube is seen extending into the abdomen, however, the tip of the feeding tube extends below the lower margin of the image. There is a left-sided subclavian central venous catheter with tip terminating in the distal superior vena cava. Left atrial appendage ligation clip. Status post median sternotomy for CABG. Bibasilar opacities (left greater than right) which may reflect areas of atelectasis and/or consolidation. Moderate left and small right pleural effusions. No evidence of pulmonary edema. Heart size is mildly enlarged. Upper mediastinal contours are within normal limits. Aortic atherosclerosis. IMPRESSION: 1. Postoperative changes and support apparatus, as above. 2. Bibasilar areas of atelectasis and/or consolidation (left greater than right) with moderate left and small right pleural effusions. 3. Aortic atherosclerosis. Electronically Signed   By: Vinnie Langton M.D.   On: 04/21/2019  09:09     Medications:     Scheduled Medications: . aspirin  81 mg Oral Daily  . atorvastatin  80 mg Oral q1800  . chlorhexidine gluconate (MEDLINE KIT)  15 mL Mouth Rinse BID  . Chlorhexidine Gluconate Cloth  6 each Topical Q0600  . Chlorhexidine Gluconate Cloth  6 each Topical Q0600  . darbepoetin (ARANESP) injection - NON-DIALYSIS  60 mcg Subcutaneous Q Tue-1800  . feeding supplement (NEPRO CARB STEADY)  237 mL Oral TID BM  . feeding supplement (PRO-STAT SUGAR FREE 64)  60 mL Per Tube BID  . Gerhardt's butt cream   Topical BID  . hydrocortisone   Rectal BID  . insulin aspart  0-24 Units Subcutaneous Q4H  . insulin glargine  36 Units Subcutaneous Daily  . mouth rinse  15 mL Mouth Rinse 10 times per day  . metoCLOPramide (REGLAN) injection  5 mg Intravenous Q8H  . midodrine  15 mg Oral TID WC  . multivitamin  1 tablet Oral QHS  . pantoprazole (PROTONIX) IV  40 mg Intravenous Q24H  . simethicone  80 mg Oral BID PC  . sodium chloride flush  10-40 mL Intracatheter Q12H    Infusions: . sodium chloride    . sodium chloride    . sodium chloride    . albumin human 60 mL/hr at 04/21/19 1000  . amiodarone 30 mg/hr (04/21/19 1000)  . calcium gluconate infusion for CRRT 40 mL/hr at 04/21/19 0200  . cefTRIAXone (ROCEPHIN)  IV 1 g (04/20/19 1724)  . feeding supplement (VITAL 1.5 CAL) 1,000 mL (04/21/19 0954)  . magnesium sulfate bolus IVPB    . norepinephrine (LEVOPHED) Adult infusion 5 mcg/min (04/21/19 1000)  . prismasol B22GK 4/0 300 mL/hr at 04/20/19 1910  . prismasol B22GK 4/0 1,500 mL/hr at 04/21/19 0842  . sodium chloride irrigation Stopped (04/19/19 1700)  . sodium  citrate 2 %/dextrose 2.5% solution 3000 mL 250 mL/hr at 04/21/19 0702    PRN Medications: sodium chloride, sodium chloride, sodium chloride, oxyCODONE **AND** acetaminophen, dextrose, fentaNYL (SUBLIMAZE) injection, heparin, hyoscyamine, levalbuterol, lidocaine (PF), lidocaine-prilocaine, ondansetron (ZOFRAN) IV,  pentafluoroprop-tetrafluoroeth, promethazine, Resource ThickenUp Clear, sodium chloride, sodium chloride flush, sodium chloride flush    Assessment/Plan   1. Acute systolic HF -> Cardiogenic shock - post-op echo on 03/20/19 EF 25-30% (pre-op 30-35%. In 12/2017 EF normal) - Required Impella support post CABG. Impella removed 2/23 - failed iHD on 3/1.  - Back on CVVHD pulling 50-100. CVP 8-10 - Remains on NE for BP support but down to 5. Also on midodrine 15 tid - Liberalize salt in diet. + TED hose. May need ab binder or florinef.   2. CAD s/p CABG this admit (LIMA->LAD, SVG->OM, SVG->RCA on 03/15/19) - No s/s ischemia - Continue ASA +statin  3. Acute hypoxic respiratory failure with Pseudomonas PNA - s/p trach on 03/27/19 - on TC with PMV.   - completed meropenem for pseudomonas PNA - trach aspirate now growing GNR - on ceftriaxone  4. PAF - s/p MAZE and LAA occlusion - Has been in/out AF. Now in NSR - Continue IV amio 30 mg per hour - Off systemic heparin with hematuria and melena/ anemia.    5. AKI  - due to shock/ATN - On CVVHD. Making minimal urine - For tunneled cath on Monday   6. Hematuria -Improved. Off CBI  7. Melena - Resolved  8. Anemia -Hematuria/melena. Resolved.  - Watch H/H  9. Debility, severe - continue PT  10. Hyperkalemia - Resolved.  K 3.8  11. UE swelling - u/s negative for DVT.  - Continue wraps.   12. F/E/N - Tube feeds per [facility]k  13. Left Pleural Effusion  - moderate to large. Likely will get Pleurex on Monday   D/w Dr. Prescott Gum.    CRITICAL CARE Performed by: Glori Bickers  Total critical care time: 35 minutes  Critical care time was exclusive of separately billable procedures and treating other patients.  Critical care was necessary to treat or prevent imminent or life-threatening deterioration.  Critical care was time spent personally by me (independent of midlevel providers or residents) on the following  activities: development of treatment plan with patient and/or surrogate as well as nursing, discussions with consultants, evaluation of patient's response to treatment, examination of patient, obtaining history from patient or surrogate, ordering and performing treatments and interventions, ordering and review of laboratory studies, ordering and review of radiographic studies, pulse oximetry and re-evaluation of patient's condition.     Glori Bickers, MD  10:59 AM

## 2019-04-21 NOTE — Progress Notes (Signed)
[facility] Progress Note     Assessment/ Plan:   1. AKI(Cr was 0.7 prior to surgery)- due to postoperative CHF/cardiogenic shock that was refractory to diuretics and started on CRRT on 03/29/19 for volume removal. CRRT off on 2/23 - discussion with advanced CHF. CRRT restarted 2/24 then off after clotting. First HD on 2/26 with minimal UF and note increased pressors and rapid afib 1. We had to restart CRRT on 3/1-3/4; needs to be on citrate. 2. Had been on Toleratingnet UF goal to 100; CVP currently 10. 3. On Levo 35mgat this time, tolerating down titration. 4. Poor response to Lasix challenge ->  CRRT 3/5. Would like to  pull the catheter for a line holiday. WBC is 9.1 and afebrile past 24hrs. Will request tunneled catheter by VIR for Mon or Tues.  He's in for a prolonged course if he recovers. . 2. Acute systolic CHF/cardiogenic shock- EF 25-30%; s/p Impella- now out. optimize volume as above. On midodrine - note increase to 15 mg TID. 3. CAD s/p [id number] by cardiogenic shock and ARF.Postop echo EF 25-30% 4. Acute hypoxic respiratory failure with pseudomonas pneumonia- s/p trach on 03/27/19.s/pabx per PCCM-> completed Meropenem. 5. PAF- s/p MAZE and LAA occlusion- therapy per CHF team 6. Hematuria- bladder irrigationper urology recommendations. 7. ABLA- Hx Melena. Transfuse as needed. Have given RRBC's on 2/22 and 2/23. And 2/25. Gave aranesp 2/23 40 mcg andthen60 mcg on3/2  8. Protein malnutrition- moderate to severe 9. Secondary hyperparathyroidism/bone mineral - stopped therenvela.  10. Disposition- Am concerned that he is a poor longterm dialysis candidate given his cardiogenic shock and trach.  Subjective:   Dyspneais better, denies f/c/n/v Levo 579m   Objective:   BP (!) 104/48   Pulse 76   Temp 98.4 F (36.9 C) (Oral)   Resp (!) 31   Ht _0  (1.727 m)   Wt 87 kg   SpO2 96%   BMI 29.16 kg/m    Intake/Output Summary (Last 24 hours) at 04/21/2019 1159 Last data filed at 04/21/2019 1100 Gross per 24 hour  Intake 2113.93 ml  Output 2899 ml  Net -785.07 ml   Weight change:   Physical Exam: Gen: elderly male in bed chronically ill-appearing HEENT trach in place; on vent  CVS: S1 S2 no rub Resp:clear;unlabored at rest; trach collar Abd: soft/ NT/ND  Ext:1+edema, in thighs as well Neuro - awake and interactive and follows commands, nods GU foley in place withirrigation, blood tinged but no clots noted Access: left chest nontunneled SCV dialysis catheter  Imaging: CT CHEST WO CONTRAST  Result Date: 04/20/2019 CLINICAL DATA:  Respiratory failure due to pneumonia and pulmonary edema post tracheostomy 03/27/2019. Evaluate bibasilar airspace opacities. Low-grade fever without leukocytosis. EXAM: CT CHEST WITHOUT CONTRAST TECHNIQUE: Multidetector CT imaging of the chest was performed following the standard protocol without IV contrast. COMPARISON:  Radiographs 04/20/2019 and 04/19/2019. Abdominal CT 04/13/2019, cardiac CT 01/23/2018 and PET-CT 10/31/2017. FINDINGS: Cardiovascular: Atherosclerosis of the aorta, great vessels and coronary arteries post median sternotomy, CABG and left atrial appendage clipping. Left subclavian central venous catheter extends to the upper SVC. There is a right arm PICC which extends to the lower SVC. The heart size is normal. There is minimal pericardial fluid. Mediastinum/Nodes: There are no enlarged mediastinal, hilar or axillary lymph nodes.Small mediastinal lymph nodes are likely chronic. Tracheostomy appears well position. A feeding tube projects beyond the distal stomach, tip not visualized. The thyroid gland, trachea and esophagus demonstrate no significant findings. Lungs/Pleura:  Moderate size dependent pleural effusions bilaterally with associated dependent pulmonary airspace opacities, probably atelectasis. There is a dependent asymmetric component  in the left upper lobe which could reflect inflammation. Underlying mild to moderate centrilobular emphysema. No pneumothorax. Upper abdomen: No acute findings are seen within the visualized upper abdomen. The gallbladder is contracted with possible small stones in the gallbladder neck. Musculoskeletal/Chest wall: There is no chest wall mass or suspicious osseous finding. Recent median sternotomy with intact sternotomy wires and no sternal dehiscence or surrounding suspicious soft tissue abnormality. Mild subcutaneous edema within the anterior chest wall. IMPRESSION: 1. Moderate-sized dependent pleural effusions with associated dependent pulmonary airspace opacities, probably atelectasis. Asymmetric dependent component in the left upper lobe could reflect inflammation. 2. Recent median sternotomy, CABG and left atrial appendage clipping. No evidence of sternal dehiscence or surrounding soft tissue abnormality. 3. Aortic Atherosclerosis (ICD10-I70.0) and Emphysema (ICD10-J43.9). Electronically Signed   By: Richardean Sale M.D.   On: 04/20/2019 13:36   DG CHEST PORT 1 VIEW  Result Date: 04/21/2019 CLINICAL DATA:  86 year old male with history of tachycardia. EXAM: PORTABLE CHEST 1 VIEW COMPARISON:  Chest x-ray 04/20/2019. FINDINGS: A tracheostomy tube is in place with tip 8.4 cm above the carina. There is a right upper extremity PICC with tip terminating in the distal superior vena cava. A feeding tube is seen extending into the abdomen, however, the tip of the feeding tube extends below the lower margin of the image. There is a left-sided subclavian central venous catheter with tip terminating in the distal superior vena cava. Left atrial appendage ligation clip. Status post median sternotomy for CABG. Bibasilar opacities (left greater than right) which may reflect areas of atelectasis and/or consolidation. Moderate left and small right pleural effusions. No evidence of pulmonary edema. Heart size is mildly  enlarged. Upper mediastinal contours are within normal limits. Aortic atherosclerosis. IMPRESSION: 1. Postoperative changes and support apparatus, as above. 2. Bibasilar areas of atelectasis and/or consolidation (left greater than right) with moderate left and small right pleural effusions. 3. Aortic atherosclerosis. Electronically Signed   By: Vinnie Langton M.D.   On: 04/21/2019 09:09   DG CHEST PORT 1 VIEW  Result Date: 04/20/2019 CLINICAL DATA:  86 year old male status post tracheostomy. Respiratory failure. History of CABG. EXAM: PORTABLE CHEST 1 VIEW COMPARISON:  Chest x-ray 04/19/2019. FINDINGS: A tracheostomy tube is in place with tip 7.9 cm above the carina. Left subclavian central venous catheter with tip terminating in the distal superior vena cava. There is a right upper extremity PICC with tip terminating in the superior cavoatrial junction. Bibasilar opacities (left greater than right), which may reflect areas of atelectasis and/or consolidation. Small right and small to moderate left pleural effusions. No pneumothorax. No evidence of pulmonary edema. Heart size appears borderline enlarged. Upper mediastinal contours are within normal limits. Aortic atherosclerosis. Status post median sternotomy for CABG. Left atrial appendage ligation clip noted. Surgical clips projecting over the right subclavian region. IMPRESSION: 1. Support apparatus and postoperative changes, as above. 2. Bibasilar opacities (left greater than right), which may reflect areas of atelectasis and/or consolidation. Small right and small to moderate left pleural effusions. 3. Aortic atherosclerosis. Electronically Signed   By: Vinnie Langton M.D.   On: 04/20/2019 09:01    Labs: [employer] Recent Labs  Lab 04/17/19 1744 04/17/19 1957 04/18/19 0356 04/18/19 0402 04/19/19 0401 04/19/19 0402 04/19/19 1754 04/19/19 1754 04/20/19 0530 04/20/19 0530 04/20/19 1652 04/20/19 2102 04/21/19 7209 04/21/19 4709 04/21/19 0328  04/21/19 0330 04/21/19 0525 04/21/19 0535  04/21/19 0630 04/21/19 0912 04/21/19 0924  NA 137   < > 125*   < > 139   < > 135   < > 138   < > 135   < > 133*   < > 137 131* 137 131* 138 136 132*  K 3.9   < > 3.3*   < > 4.0   < > 3.5   < > 4.0   < > 4.4   < > 3.9   < > 4.0 4.0 3.8 3.9 3.8 3.8 3.8  CL 95*   < > 82*   < > 90*   < > 82*   < > 85*   < > 83*   < > 87*   < > 86* 85* 87* 86* 87* 91* 89*  CO2 29  --  29  --  37*  --  38*  --  41*  --  39*  --  34*  --   --   --   --   --   --   --   --   GLUCOSE 135*   < > 446*   < > 127*   < > 286*   < > 153*   < > 155*   < > 238*   < > 154* 192* 185* 238* 186* 175* 206*  BUN 22   < > 20   < > 21   < > 24*   < > 45*   < > 63*   < > 48*   < > 43* 48* 38* 43* 32* 36* 39*  CREATININE 2.02*   < > 1.73*   < > 1.63*   < > 1.65*   < > 2.46*   < > 3.14*   < > 2.39*   < > 2.00* 2.40* 1.90* 2.20* 1.90* 1.80* 2.20*  CALCIUM 7.2*  --  >15.0*  --  9.5  --  14.1*  --  8.6*  --  8.3*  --  11.3*  --   --   --   --   --   --   --   --   PHOS 2.0*  --  1.7*  --  2.0*  --  1.4*  --  1.8*  --  2.2*  --  1.6*  --   --   --   --   --   --   --   --    < > = values in this interval not displayed.   CBC Recent Labs  Lab 04/18/19 0356 04/18/19 0402 04/19/19 0401 04/19/19 0402 04/20/19 0530 04/20/19 2102 04/21/19 0250 04/21/19 0328 04/21/19 0535 04/21/19 0630 04/21/19 0912 04/21/19 0924  WBC 9.1  --  11.8*  --  9.1  --  8.0  --   --   --   --   --   NEUTROABS  --   --  9.2*  --   --   --   --   --   --   --   --   --   HGB 8.0*   < > 8.5*   < > 8.6*   < > 8.4*   < > 8.5* 9.5* 9.9* 12.6*  HCT 26.2*   < > 28.1*   < > 27.9*   < > 26.3*   < > 25.0* 28.0* 29.0* 37.0*  MCV 95.6  --  96.6  --  94.3  --  96.0  --   --   --   --   --  PLT 237  --  255  --  216  --  196  --   --   --   --   --    < > = values in this interval not displayed.    Medications:    . aspirin  81 mg Oral Daily  . atorvastatin  80 mg Oral q1800  . chlorhexidine gluconate (MEDLINE KIT)  15 mL  Mouth Rinse BID  . Chlorhexidine Gluconate Cloth  6 each Topical Q0600  . Chlorhexidine Gluconate Cloth  6 each Topical Q0600  . darbepoetin (ARANESP) injection - NON-DIALYSIS  60 mcg Subcutaneous Q Tue-1800  . feeding supplement (NEPRO CARB STEADY)  237 mL Oral TID BM  . feeding supplement (PRO-STAT SUGAR FREE 64)  60 mL Per Tube BID  . Gerhardt's butt cream   Topical BID  . hydrocortisone   Rectal BID  . insulin aspart  0-24 Units Subcutaneous Q4H  . insulin glargine  36 Units Subcutaneous Daily  . mouth rinse  15 mL Mouth Rinse 10 times per day  . metoCLOPramide (REGLAN) injection  5 mg Intravenous Q8H  . midodrine  15 mg Oral TID WC  . multivitamin  1 tablet Oral QHS  . pantoprazole (PROTONIX) IV  40 mg Intravenous Q24H  . simethicone  80 mg Oral BID PC  . sodium chloride flush  10-40 mL Intracatheter Q12H      Otelia Santee, MD 04/21/2019, 11:59 AM

## 2019-04-22 ENCOUNTER — Inpatient Hospital Stay (HOSPITAL_COMMUNITY): Payer: Medicare Other

## 2019-04-22 LAB — POCT I-STAT, CHEM 8
BUN: 28 mg/dL — ABNORMAL HIGH (ref 8–23)
BUN: 31 mg/dL — ABNORMAL HIGH (ref 8–23)
BUN: 25 mg/dL — ABNORMAL HIGH (ref 8–23)
BUN: 28 mg/dL — ABNORMAL HIGH (ref 8–23)
BUN: 29 mg/dL — ABNORMAL HIGH (ref 8–23)
BUN: 30 mg/dL — ABNORMAL HIGH (ref 8–23)
BUN: 24 mg/dL — ABNORMAL HIGH (ref 8–23)
BUN: 27 mg/dL — ABNORMAL HIGH (ref 8–23)
BUN: 25 mg/dL — ABNORMAL HIGH (ref 8–23)
BUN: 29 mg/dL — ABNORMAL HIGH (ref 8–23)
BUN: 24 mg/dL — ABNORMAL HIGH (ref 8–23)
BUN: 27 mg/dL — ABNORMAL HIGH (ref 8–23)
BUN: 23 mg/dL (ref 8–23)
BUN: 26 mg/dL — ABNORMAL HIGH (ref 8–23)
Calcium, Ion: 0.35 mmol/L — CL (ref 1.15–1.40)
Calcium, Ion: 1.08 mmol/L — ABNORMAL LOW (ref 1.15–1.40)
Calcium, Ion: 0.38 mmol/L — CL (ref 1.15–1.40)
Calcium, Ion: 0.83 mmol/L — CL (ref 1.15–1.40)
Calcium, Ion: 0.86 mmol/L — CL (ref 1.15–1.40)
Calcium, Ion: 1.16 mmol/L (ref 1.15–1.40)
Calcium, Ion: 0.37 mmol/L — CL (ref 1.15–1.40)
Calcium, Ion: 1.04 mmol/L — ABNORMAL LOW (ref 1.15–1.40)
Calcium, Ion: 0.39 mmol/L — CL (ref 1.15–1.40)
Calcium, Ion: 1.09 mmol/L — ABNORMAL LOW (ref 1.15–1.40)
Calcium, Ion: 0.39 mmol/L — CL (ref 1.15–1.40)
Calcium, Ion: 1.07 mmol/L — ABNORMAL LOW (ref 1.15–1.40)
Calcium, Ion: 0.37 mmol/L — CL (ref 1.15–1.40)
Calcium, Ion: 1.07 mmol/L — ABNORMAL LOW (ref 1.15–1.40)
Chloride: 89 mmol/L — ABNORMAL LOW (ref 98–111)
Chloride: 90 mmol/L — ABNORMAL LOW (ref 98–111)
Chloride: 89 mmol/L — ABNORMAL LOW (ref 98–111)
Chloride: 90 mmol/L — ABNORMAL LOW (ref 98–111)
Chloride: 90 mmol/L — ABNORMAL LOW (ref 98–111)
Chloride: 90 mmol/L — ABNORMAL LOW (ref 98–111)
Chloride: 87 mmol/L — ABNORMAL LOW (ref 98–111)
Chloride: 89 mmol/L — ABNORMAL LOW (ref 98–111)
Chloride: 88 mmol/L — ABNORMAL LOW (ref 98–111)
Chloride: 88 mmol/L — ABNORMAL LOW (ref 98–111)
Chloride: 89 mmol/L — ABNORMAL LOW (ref 98–111)
Chloride: 89 mmol/L — ABNORMAL LOW (ref 98–111)
Chloride: 86 mmol/L — ABNORMAL LOW (ref 98–111)
Chloride: 88 mmol/L — ABNORMAL LOW (ref 98–111)
Creatinine, Ser: 1.4 mg/dL — ABNORMAL HIGH (ref 0.61–1.24)
Creatinine, Ser: 1.7 mg/dL — ABNORMAL HIGH (ref 0.61–1.24)
Creatinine, Ser: 1.3 mg/dL — ABNORMAL HIGH (ref 0.61–1.24)
Creatinine, Ser: 1.6 mg/dL — ABNORMAL HIGH (ref 0.61–1.24)
Creatinine, Ser: 1.6 mg/dL — ABNORMAL HIGH (ref 0.61–1.24)
Creatinine, Ser: 1.7 mg/dL — ABNORMAL HIGH (ref 0.61–1.24)
Creatinine, Ser: 1.4 mg/dL — ABNORMAL HIGH (ref 0.61–1.24)
Creatinine, Ser: 1.6 mg/dL — ABNORMAL HIGH (ref 0.61–1.24)
Creatinine, Ser: 1.3 mg/dL — ABNORMAL HIGH (ref 0.61–1.24)
Creatinine, Ser: 1.5 mg/dL — ABNORMAL HIGH (ref 0.61–1.24)
Creatinine, Ser: 1.2 mg/dL (ref 0.61–1.24)
Creatinine, Ser: 1.5 mg/dL — ABNORMAL HIGH (ref 0.61–1.24)
Creatinine, Ser: 1.3 mg/dL — ABNORMAL HIGH (ref 0.61–1.24)
Creatinine, Ser: 1.6 mg/dL — ABNORMAL HIGH (ref 0.61–1.24)
Glucose, Bld: 207 mg/dL — ABNORMAL HIGH (ref 70–99)
Glucose, Bld: 249 mg/dL — ABNORMAL HIGH (ref 70–99)
Glucose, Bld: 124 mg/dL — ABNORMAL HIGH (ref 70–99)
Glucose, Bld: 145 mg/dL — ABNORMAL HIGH (ref 70–99)
Glucose, Bld: 191 mg/dL — ABNORMAL HIGH (ref 70–99)
Glucose, Bld: 80 mg/dL (ref 70–99)
Glucose, Bld: 180 mg/dL — ABNORMAL HIGH (ref 70–99)
Glucose, Bld: 210 mg/dL — ABNORMAL HIGH (ref 70–99)
Glucose, Bld: 175 mg/dL — ABNORMAL HIGH (ref 70–99)
Glucose, Bld: 203 mg/dL — ABNORMAL HIGH (ref 70–99)
Glucose, Bld: 137 mg/dL — ABNORMAL HIGH (ref 70–99)
Glucose, Bld: 153 mg/dL — ABNORMAL HIGH (ref 70–99)
Glucose, Bld: 181 mg/dL — ABNORMAL HIGH (ref 70–99)
Glucose, Bld: 212 mg/dL — ABNORMAL HIGH (ref 70–99)
HCT: 24 % — ABNORMAL LOW (ref 39.0–52.0)
HCT: 25 % — ABNORMAL LOW (ref 39.0–52.0)
HCT: 22 % — ABNORMAL LOW (ref 39.0–52.0)
HCT: 23 % — ABNORMAL LOW (ref 39.0–52.0)
HCT: 23 % — ABNORMAL LOW (ref 39.0–52.0)
HCT: 25 % — ABNORMAL LOW (ref 39.0–52.0)
HCT: 23 % — ABNORMAL LOW (ref 39.0–52.0)
HCT: 25 % — ABNORMAL LOW (ref 39.0–52.0)
HCT: 22 % — ABNORMAL LOW (ref 39.0–52.0)
HCT: 26 % — ABNORMAL LOW (ref 39.0–52.0)
HCT: 20 % — ABNORMAL LOW (ref 39.0–52.0)
HCT: 26 % — ABNORMAL LOW (ref 39.0–52.0)
HCT: 23 % — ABNORMAL LOW (ref 39.0–52.0)
HCT: 25 % — ABNORMAL LOW (ref 39.0–52.0)
Hemoglobin: 8.2 g/dL — ABNORMAL LOW (ref 13.0–17.0)
Hemoglobin: 8.5 g/dL — ABNORMAL LOW (ref 13.0–17.0)
Hemoglobin: 7.5 g/dL — ABNORMAL LOW (ref 13.0–17.0)
Hemoglobin: 7.8 g/dL — ABNORMAL LOW (ref 13.0–17.0)
Hemoglobin: 7.8 g/dL — ABNORMAL LOW (ref 13.0–17.0)
Hemoglobin: 8.5 g/dL — ABNORMAL LOW (ref 13.0–17.0)
Hemoglobin: 7.8 g/dL — ABNORMAL LOW (ref 13.0–17.0)
Hemoglobin: 8.5 g/dL — ABNORMAL LOW (ref 13.0–17.0)
Hemoglobin: 7.5 g/dL — ABNORMAL LOW (ref 13.0–17.0)
Hemoglobin: 8.8 g/dL — ABNORMAL LOW (ref 13.0–17.0)
Hemoglobin: 6.8 g/dL — CL (ref 13.0–17.0)
Hemoglobin: 8.8 g/dL — ABNORMAL LOW (ref 13.0–17.0)
Hemoglobin: 7.8 g/dL — ABNORMAL LOW (ref 13.0–17.0)
Hemoglobin: 8.5 g/dL — ABNORMAL LOW (ref 13.0–17.0)
Potassium: 4.1 mmol/L (ref 3.5–5.1)
Potassium: 4.1 mmol/L (ref 3.5–5.1)
Potassium: 3.9 mmol/L (ref 3.5–5.1)
Potassium: 4 mmol/L (ref 3.5–5.1)
Potassium: 4 mmol/L (ref 3.5–5.1)
Potassium: 4 mmol/L (ref 3.5–5.1)
Potassium: 4 mmol/L (ref 3.5–5.1)
Potassium: 4 mmol/L (ref 3.5–5.1)
Potassium: 3.8 mmol/L (ref 3.5–5.1)
Potassium: 3.9 mmol/L (ref 3.5–5.1)
Potassium: 4 mmol/L (ref 3.5–5.1)
Potassium: 4 mmol/L (ref 3.5–5.1)
Potassium: 4.1 mmol/L (ref 3.5–5.1)
Potassium: 4.2 mmol/L (ref 3.5–5.1)
Sodium: 134 mmol/L — ABNORMAL LOW (ref 135–145)
Sodium: 139 mmol/L (ref 135–145)
Sodium: 134 mmol/L — ABNORMAL LOW (ref 135–145)
Sodium: 136 mmol/L (ref 135–145)
Sodium: 138 mmol/L (ref 135–145)
Sodium: 139 mmol/L (ref 135–145)
Sodium: 135 mmol/L (ref 135–145)
Sodium: 138 mmol/L (ref 135–145)
Sodium: 135 mmol/L (ref 135–145)
Sodium: 139 mmol/L (ref 135–145)
Sodium: 133 mmol/L — ABNORMAL LOW (ref 135–145)
Sodium: 138 mmol/L (ref 135–145)
Sodium: 132 mmol/L — ABNORMAL LOW (ref 135–145)
Sodium: 137 mmol/L (ref 135–145)
TCO2: 32 mmol/L (ref 22–32)
TCO2: 35 mmol/L — ABNORMAL HIGH (ref 22–32)
TCO2: 35 mmol/L — ABNORMAL HIGH (ref 22–32)
TCO2: 36 mmol/L — ABNORMAL HIGH (ref 22–32)
TCO2: 37 mmol/L — ABNORMAL HIGH (ref 22–32)
TCO2: 37 mmol/L — ABNORMAL HIGH (ref 22–32)
TCO2: 32 mmol/L (ref 22–32)
TCO2: 35 mmol/L — ABNORMAL HIGH (ref 22–32)
TCO2: 33 mmol/L — ABNORMAL HIGH (ref 22–32)
TCO2: 36 mmol/L — ABNORMAL HIGH (ref 22–32)
TCO2: 35 mmol/L — ABNORMAL HIGH (ref 22–32)
TCO2: 36 mmol/L — ABNORMAL HIGH (ref 22–32)
TCO2: 33 mmol/L — ABNORMAL HIGH (ref 22–32)
TCO2: 36 mmol/L — ABNORMAL HIGH (ref 22–32)

## 2019-04-22 LAB — RENAL FUNCTION PANEL
Albumin: 2.4 g/dL — ABNORMAL LOW (ref 3.5–5.0)
Anion gap: 10 (ref 5–15)
BUN: 26 mg/dL — ABNORMAL HIGH (ref 8–23)
CO2: 35 mmol/L — ABNORMAL HIGH (ref 22–32)
Calcium: 9.4 mg/dL (ref 8.9–10.3)
Chloride: 91 mmol/L — ABNORMAL LOW (ref 98–111)
Creatinine, Ser: 1.48 mg/dL — ABNORMAL HIGH (ref 0.61–1.24)
GFR calc Af Amer: 48 mL/min — ABNORMAL LOW (ref >60)
GFR calc non Af Amer: 42 mL/min — ABNORMAL LOW (ref >60)
Glucose, Bld: 172 mg/dL — ABNORMAL HIGH (ref 70–99)
Phosphorus: 1.6 mg/dL — ABNORMAL LOW (ref 2.5–4.6)
Potassium: 4.2 mmol/L (ref 3.5–5.1)
Sodium: 136 mmol/L (ref 135–145)

## 2019-04-22 LAB — CBC
HCT: 24.2 % — ABNORMAL LOW (ref 39.0–52.0)
HCT: 23.2 % — ABNORMAL LOW (ref 39.0–52.0)
Hemoglobin: 7.3 g/dL — ABNORMAL LOW (ref 13.0–17.0)
Hemoglobin: 7 g/dL — ABNORMAL LOW (ref 13.0–17.0)
MCH: 29.4 pg (ref 26.0–34.0)
MCH: 29.3 pg (ref 26.0–34.0)
MCHC: 30.2 g/dL (ref 30.0–36.0)
MCHC: 30.2 g/dL (ref 30.0–36.0)
MCV: 97.6 fL (ref 80.0–100.0)
MCV: 97.1 fL (ref 80.0–100.0)
Platelets: 226 10*3/uL (ref 150–400)
Platelets: 191 10*3/uL (ref 150–400)
RBC: 2.48 MIL/uL — ABNORMAL LOW (ref 4.22–5.81)
RBC: 2.39 MIL/uL — ABNORMAL LOW (ref 4.22–5.81)
RDW: 16.1 % — ABNORMAL HIGH (ref 11.5–15.5)
RDW: 16.2 % — ABNORMAL HIGH (ref 11.5–15.5)
WBC: 10.1 10*3/uL (ref 4.0–10.5)
WBC: 8.9 10*3/uL (ref 4.0–10.5)
nRBC: 0 % (ref 0.0–0.2)
nRBC: 0 % (ref 0.0–0.2)

## 2019-04-22 LAB — COMPREHENSIVE METABOLIC PANEL
ALT: 11 U/L (ref 0–44)
AST: 22 U/L (ref 15–41)
Albumin: 2.6 g/dL — ABNORMAL LOW (ref 3.5–5.0)
Alkaline Phosphatase: 61 U/L (ref 38–126)
Anion gap: 11 (ref 5–15)
BUN: 29 mg/dL — ABNORMAL HIGH (ref 8–23)
CO2: 36 mmol/L — ABNORMAL HIGH (ref 22–32)
Calcium: 7 mg/dL — ABNORMAL LOW (ref 8.9–10.3)
Chloride: 92 mmol/L — ABNORMAL LOW (ref 98–111)
Creatinine, Ser: 1.61 mg/dL — ABNORMAL HIGH (ref 0.61–1.24)
GFR calc Af Amer: 44 mL/min — ABNORMAL LOW (ref >60)
GFR calc non Af Amer: 38 mL/min — ABNORMAL LOW (ref >60)
Glucose, Bld: 132 mg/dL — ABNORMAL HIGH (ref 70–99)
Potassium: 4 mmol/L (ref 3.5–5.1)
Sodium: 139 mmol/L (ref 135–145)
Total Bilirubin: 0.4 mg/dL (ref 0.3–1.2)
Total Protein: 5.1 g/dL — ABNORMAL LOW (ref 6.5–8.1)

## 2019-04-22 LAB — PHOSPHORUS: Phosphorus: 1.4 mg/dL — ABNORMAL LOW (ref 2.5–4.6)

## 2019-04-22 LAB — CALCIUM, IONIZED
Calcium, Ionized, Serum: 5.3 mg/dL (ref 4.5–5.6)
Calcium, Ionized, Serum: 3.6 mg/dL — ABNORMAL LOW (ref 4.5–5.6)

## 2019-04-22 LAB — GLUCOSE, CAPILLARY
Glucose-Capillary: 120 mg/dL — ABNORMAL HIGH (ref 70–99)
Glucose-Capillary: 155 mg/dL — ABNORMAL HIGH (ref 70–99)
Glucose-Capillary: 213 mg/dL — ABNORMAL HIGH (ref 70–99)
Glucose-Capillary: 232 mg/dL — ABNORMAL HIGH (ref 70–99)
Glucose-Capillary: 87 mg/dL (ref 70–99)

## 2019-04-22 LAB — MAGNESIUM: Magnesium: 1.9 mg/dL (ref 1.7–2.4)

## 2019-04-22 MED ORDER — LOPERAMIDE HCL 1 MG/7.5ML PO SUSP
4.0000 mg | ORAL | Status: DC | PRN
  Administered 2019-04-22 – 2019-04-26 (×4): 4 mg via ORAL
  Filled 2019-04-22 (×5): qty 30

## 2019-04-22 MED ORDER — OSMOLITE 1.2 CAL PO LIQD
1000.0000 mL | ORAL | Status: DC
  Administered 2019-04-22 – 2019-04-23 (×2): 1000 mL
  Filled 2019-04-22 (×4): qty 1000

## 2019-04-22 MED ORDER — CEFAZOLIN SODIUM-DEXTROSE 2-4 GM/100ML-% IV SOLN
2.0000 g | INTRAVENOUS | Status: AC
  Filled 2019-04-22 (×2): qty 100

## 2019-04-22 MED ORDER — FENTANYL CITRATE (PF) 100 MCG/2ML IJ SOLN
12.5000 ug | INTRAMUSCULAR | Status: DC | PRN
  Administered 2019-04-22: 25 ug via INTRAVENOUS
  Administered 2019-04-22: 12.5 ug via INTRAVENOUS
  Administered 2019-04-22 – 2019-04-23 (×2): 25 ug via INTRAVENOUS
  Filled 2019-04-22 (×3): qty 2

## 2019-04-22 MED ORDER — CIPROFLOXACIN HCL 500 MG PO TABS
500.0000 mg | ORAL_TABLET | Freq: Two times a day (BID) | ORAL | Status: DC
  Administered 2019-04-22 – 2019-04-24 (×5): 500 mg via ORAL
  Filled 2019-04-22 (×5): qty 1

## 2019-04-22 NOTE — Progress Notes (Signed)
[facility] Progress Note    Assessment/ Plan:   1. AKI(Cr was 0.7 prior to surgery)- due to postoperative CHF/cardiogenic shock that was refractory to diuretics and started on CRRT on 03/29/19 for volume removal. CRRT off on 2/23 - discussion with advanced CHF. CRRT restarted 2/24 then off after clotting. First HD on 2/26 with minimal UF and note increased pressors and rapid afib 1. CRRT on 3/1-3/4; needs to beon citrate. 2. Poor response to Lasix challenge ->  CRRT 3/5. Has been onToleratingnet UF goal to 100; CVP currently 6-9 + Levo70mgwhich is being titrated  +  Midodrine. 3. Will continue CRRT until 7AM Mon AM and request VIR for a tunneled catheter. In for a prolonged course if he recovers.. 2. Acute systolic CHF/cardiogenic shock- EF 25-30%; s/p Impella- now out. optimize volume as above. On midodrine  15 mg TID. 3. CAD s/p [id number] by cardiogenic shock and ARF.Postop echo EF 25-30% 4. Acute hypoxic respiratory failure with pseudomonas pneumonia- s/p trach on 03/27/19.s/pabx per PCCM-> completed Meropenem.Rocephin (possibly to be d/c) 5. PAF- s/p MAZE and LAA occlusion- therapy per CHF team 6. Hematuria- bladder irrigationper urology recommendations. 7. ABLA- Hx Melena. Transfuse as needed. Have given RRBC's on 2/22 and 2/23. And 2/25. Gave aranesp 2/23 40 mcg andthen60 mcg on3/2  8. Protein malnutrition- moderate to severe 9. Secondary hyperparathyroidism/bone mineral - stopped therenvela.  10. Disposition- Am concerned that he is a poor longterm dialysis candidate given his cardiogenic shock and trach.  Subjective:   Dyspneais better, denies f/c/n/v Levo396m   Objective:   BP (!) 130/43   Pulse 75   Temp 98 F (36.7 C) (Oral)   Resp 14   Ht '5\' 8"'$  (1.727 m)   Wt 85.7 kg   SpO2 97%   BMI 28.73 kg/m   Intake/Output Summary (Last 24 hours) at 04/22/2019 1122 Last data filed at 04/22/2019 1118 Gross per  24 hour  Intake 7617.24 ml  Output 7726 ml  Net -108.76 ml   Weight change:   Physical Exam: Gen: elderly male in bed chronically ill-appearing HEENT trach in place; on vent  CVS: S1 S2 no rub Resp:clear;unlabored at rest; trach collar Abd: soft/ NT/ND  Ext:1+edema, in thighs as well Neuro - awake and interactive and follows commands, nods GU foley in place withirrigation, blood tinged but no clots noted Access: left chest nontunneledSCVdialysis catheter  Imaging: CT CHEST WO CONTRAST  Result Date: 04/20/2019 CLINICAL DATA:  Respiratory failure due to pneumonia and pulmonary edema post tracheostomy 03/27/2019. Evaluate bibasilar airspace opacities. Low-grade fever without leukocytosis. EXAM: CT CHEST WITHOUT CONTRAST TECHNIQUE: Multidetector CT imaging of the chest was performed following the standard protocol without IV contrast. COMPARISON:  Radiographs 04/20/2019 and 04/19/2019. Abdominal CT 04/13/2019, cardiac CT 01/23/2018 and PET-CT 10/31/2017. FINDINGS: Cardiovascular: Atherosclerosis of the aorta, great vessels and coronary arteries post median sternotomy, CABG and left atrial appendage clipping. Left subclavian central venous catheter extends to the upper SVC. There is a right arm PICC which extends to the lower SVC. The heart size is normal. There is minimal pericardial fluid. Mediastinum/Nodes: There are no enlarged mediastinal, hilar or axillary lymph nodes.Small mediastinal lymph nodes are likely chronic. Tracheostomy appears well position. A feeding tube projects beyond the distal stomach, tip not visualized. The thyroid gland, trachea and esophagus demonstrate no significant findings. Lungs/Pleura: Moderate size dependent pleural effusions bilaterally with associated dependent pulmonary airspace opacities, probably atelectasis. There is a dependent asymmetric component in the left upper lobe which could  reflect inflammation. Underlying mild to moderate centrilobular  emphysema. No pneumothorax. Upper abdomen: No acute findings are seen within the visualized upper abdomen. The gallbladder is contracted with possible small stones in the gallbladder neck. Musculoskeletal/Chest wall: There is no chest wall mass or suspicious osseous finding. Recent median sternotomy with intact sternotomy wires and no sternal dehiscence or surrounding suspicious soft tissue abnormality. Mild subcutaneous edema within the anterior chest wall. IMPRESSION: 1. Moderate-sized dependent pleural effusions with associated dependent pulmonary airspace opacities, probably atelectasis. Asymmetric dependent component in the left upper lobe could reflect inflammation. 2. Recent median sternotomy, CABG and left atrial appendage clipping. No evidence of sternal dehiscence or surrounding soft tissue abnormality. 3. Aortic Atherosclerosis (ICD10-I70.0) and Emphysema (ICD10-J43.9). Electronically Signed   By: Richardean Sale M.D.   On: 04/20/2019 13:36   DG Chest Port 1 View  Result Date: 04/22/2019 CLINICAL DATA:  86 year old male status post CABG on 03/16/2019. EXAM: PORTABLE CHEST 1 VIEW COMPARISON:  Chest x-ray 04/21/2019. FINDINGS: A tracheostomy tube is in place with tip 8.2 cm above the carina. A feeding tube is seen extending into the abdomen, however, the tip of the feeding tube extends below the lower margin of the image. Left subclavian Vas-Cath with tip terminating in the distal superior vena cava. There is a right upper extremity PICC with tip terminating in the superior cavoatrial junction. Bibasilar opacities (left greater than right) which may reflect areas of atelectasis and/or consolidation. Moderate left and small right pleural effusions. No evidence of pulmonary edema. Heart size is normal. Upper mediastinal contours are within normal limits. Status post median sternotomy for CABG. Left atrial appendage ligation clip. Surgical clips project over the right subclavian region. IMPRESSION: 1.  Support apparatus and postoperative changes, as above. 2. Moderate left and small right pleural effusions with probable basilar atelectasis bilaterally. 3. Aortic atherosclerosis. Electronically Signed   By: Vinnie Langton M.D.   On: 04/22/2019 07:50   DG CHEST PORT 1 VIEW  Result Date: 04/21/2019 CLINICAL DATA:  86 year old male with history of tachycardia. EXAM: PORTABLE CHEST 1 VIEW COMPARISON:  Chest x-ray 04/20/2019. FINDINGS: A tracheostomy tube is in place with tip 8.4 cm above the carina. There is a right upper extremity PICC with tip terminating in the distal superior vena cava. A feeding tube is seen extending into the abdomen, however, the tip of the feeding tube extends below the lower margin of the image. There is a left-sided subclavian central venous catheter with tip terminating in the distal superior vena cava. Left atrial appendage ligation clip. Status post median sternotomy for CABG. Bibasilar opacities (left greater than right) which may reflect areas of atelectasis and/or consolidation. Moderate left and small right pleural effusions. No evidence of pulmonary edema. Heart size is mildly enlarged. Upper mediastinal contours are within normal limits. Aortic atherosclerosis. IMPRESSION: 1. Postoperative changes and support apparatus, as above. 2. Bibasilar areas of atelectasis and/or consolidation (left greater than right) with moderate left and small right pleural effusions. 3. Aortic atherosclerosis. Electronically Signed   By: Vinnie Langton M.D.   On: 04/21/2019 09:09    Labs: [facility] Recent Labs  Lab 04/19/19 0401 04/19/19 0402 04/19/19 1754 04/19/19 1754 04/20/19 0530 04/20/19 0530 04/20/19 1652 04/20/19 2102 04/21/19 0313 04/21/19 0328 04/21/19 1652 04/21/19 1709 04/22/19 0020 04/22/19 0025 04/22/19 0500 04/22/19 0501 04/22/19 0507 04/22/19 0559 04/22/19 0603  NA 139   < > 135   < > 138   < > 135   < > 133*   < >  135   < > 134* 139 139 136 138 134* 139  K 4.0    < > 3.5   < > 4.0   < > 4.4   < > 3.9   < > 4.3   < > 4.1 4.1 4.0 3.9 4.0 4.0 4.0  CL 90*   < > 82*   < > 85*   < > 83*   < > 87*   < > 90*   < > 89* 90* 92* 90* 90* 89* 90*  CO2 37*  --  38*  --  41*  --  39*  --  34*  --  33*  --   --   --  36*  --   --   --   --   GLUCOSE 127*   < > 286*   < > 153*   < > 155*   < > 238*   < > 264*   < > 249* 207* 132* 124* 80 191* 145*  BUN 21   < > 24*   < > 45*   < > 63*   < > 48*   < > 36*   < > 31* 28* 29* 29* 30* 28* 25*  CREATININE 1.63*   < > 1.65*   < > 2.46*   < > 3.14*   < > 2.39*   < > 1.95*   < > 1.70* 1.40* 1.61* 1.60* 1.70* 1.60* 1.30*  CALCIUM 9.5  --  14.1*  --  8.6*  --  8.3*  --  11.3*  --  10.0  --   --   --  7.0*  --   --   --   --   PHOS 2.0*  --  1.4*  --  1.8*  --  2.2*  --  1.6*  --  1.3*  --   --   --  1.4*  --   --   --   --    < > = values in this interval not displayed.   CBC Recent Labs  Lab 04/19/19 0401 04/19/19 0402 04/20/19 0530 04/20/19 2102 04/21/19 0250 04/21/19 0328 04/22/19 0500 04/22/19 0500 04/22/19 0501 04/22/19 0507 04/22/19 0559 04/22/19 0603  WBC 11.8*  --  9.1  --  8.0  --  10.1  --   --   --   --   --   NEUTROABS 9.2*  --   --   --   --   --   --   --   --   --   --   --   HGB 8.5*   < > 8.6*   < > 8.4*   < > 7.3*   < > 7.8* 7.8* 7.5* 8.5*  HCT 28.1*   < > 27.9*   < > 26.3*   < > 24.2*   < > 23.0* 23.0* 22.0* 25.0*  MCV 96.6  --  94.3  --  96.0  --  97.6  --   --   --   --   --   PLT 255  --  216  --  196  --  226  --   --   --   --   --    < > = values in this interval not displayed.    Medications:    . aspirin  81 mg Oral Daily  . atorvastatin  80 mg Oral q1800  . chlorhexidine gluconate (  MEDLINE KIT)  15 mL Mouth Rinse BID  . Chlorhexidine Gluconate Cloth  6 each Topical Q0600  . Chlorhexidine Gluconate Cloth  6 each Topical Q0600  . darbepoetin (ARANESP) injection - NON-DIALYSIS  60 mcg Subcutaneous Q Tue-1800  . feeding supplement (NEPRO CARB STEADY)  237 mL Oral TID BM  . feeding  supplement (PRO-STAT SUGAR FREE 64)  60 mL Per Tube BID  . Gerhardt's butt cream   Topical BID  . hydrocortisone   Rectal BID  . insulin aspart  0-24 Units Subcutaneous Q4H  . insulin glargine  36 Units Subcutaneous Daily  . mouth rinse  15 mL Mouth Rinse 10 times per day  . metoCLOPramide (REGLAN) injection  5 mg Intravenous Q8H  . midodrine  15 mg Oral TID WC  . multivitamin  1 tablet Oral QHS  . pantoprazole (PROTONIX) IV  40 mg Intravenous Q24H  . simethicone  80 mg Oral BID PC  . sodium chloride flush  10-40 mL Intracatheter Q12H      Otelia Santee, MD 04/22/2019, 11:22 AM

## 2019-04-22 NOTE — Progress Notes (Signed)
RT note- sp02 slightly lower due to PMV in place while resting, removed and now sp02 97%.

## 2019-04-22 NOTE — Progress Notes (Signed)
Pt with frequent liquid stools, at time incontinent. Pt with complaints of bladder pain, bladder fullness, and inability to urinate. Pt with cath, placed by urology. Continuous irrigation off. Urine output scant and bloody. Dr. Mallie Mussel notified order received.

## 2019-04-22 NOTE — Consult Note (Signed)
Chief Complaint: Patient was seen in consultation today for renal failure  Referring Physician(s): Dr. Otelia Santee  Supervising Physician: Sandi Mariscal  Patient Status: [facility] - In-pt  History of Present Illness: Jesse Murphy is a 86 y.o. male with past medical history of a fib, HTN, a fib who presented to [facility] with a fib and CAD.  He was found to have CAD requiring CABG.  Developed cardiogenic shock with multisystem organ dysfunction including acute renal failure.  He was placed on HD via temporary line, however failed intermittent HD 3/1 and was placed on CRRT.  He is in need of line exchange.  IR consulted for tunneled HD request.   Patient assessed.  Intubated.  Sedated/pain medicine.  On CRRT via L subclavian line.  Groggy.   Past Medical History:  Diagnosis Date  . Atrial fibrillation ([place])   . CAD (coronary artery disease)    a. s/p PCI in 1992 b. Coronary CT in 01/2018 showing extensive coronary calcification; FFR indeterminate --> medical management pursued at that time as patient not interested in repeat cath  . Cancer [facility])    prostate  . Dyspnea   . Hypertension     Past Surgical History:  Procedure Laterality Date  . CLIPPING OF ATRIAL APPENDAGE N/A 03/16/2019   Procedure: CLIPPING OF LEFT  ATRIAL APPENDAGE - USING ATRICLIP SIZE 40;  Surgeon: Ivin Poot, MD;  Location: [place];  Service: Open Heart Surgery;  Laterality: N/A;  . COLONOSCOPY N/A 08/25/2017   Procedure: COLONOSCOPY;  Surgeon: Rogene Houston, MD;  [facility];  Service: Endoscopy;  Laterality: N/A;  . CORONARY ARTERY BYPASS GRAFT N/A 03/16/2019   Procedure: CORONARY ARTERY BYPASS GRAFTING (CABG), ON PUMP, TIMES THREE, USING LEFT INTERNAL MAMMARY ARTERY, RIGHT GREATER SAPHENOUS VEIN HARVESTED ENDOSCOPICALLY;  Surgeon: Ivin Poot, MD;  Location: [place];  Service: Open Heart Surgery;  Laterality: N/A;  swan only  . HERNIA REPAIR    . MAZE N/A 03/16/2019   Procedure: MAZE;  Surgeon: Ivin Poot, MD;  Location: [place];  Service: Open Heart Surgery;  Laterality: N/A;  . PLACEMENT OF IMPELLA LEFT VENTRICULAR ASSIST DEVICE Right 03/27/2019   Procedure: Placement Of Impella Left Ventricular Assist Device using ABIOMED Impella 5.5 with SmartAssist Device.;  Surgeon: Wonda Olds, MD;  Location: [facility];  Service: Thoracic;  Laterality: Right;  . PROSTATE SURGERY    . REMOVAL OF IMPELLA LEFT VENTRICULAR ASSIST DEVICE N/A 04/10/2019   Procedure: REMOVAL OF IMPELLA LEFT VENTRICULAR ASSIST DEVICE WITH INSERTION OF RIGHT FEMORAL ARTERIAL LINE;  Surgeon: Ivin Poot, MD;  Location: [place];  Service: Open Heart Surgery;  Laterality: N/A;  . RIGHT/LEFT HEART CATH AND CORONARY ANGIOGRAPHY N/A 03/15/2019   Procedure: RIGHT/LEFT HEART CATH AND CORONARY ANGIOGRAPHY;  Surgeon: Burnell Blanks, MD;  Location: [place] CV LAB;  Service: Cardiovascular;  Laterality: N/A;  . TEE WITHOUT CARDIOVERSION N/A 03/16/2019   Procedure: TRANSESOPHAGEAL ECHOCARDIOGRAM (TEE);  Surgeon: Prescott Gum, Collier Salina, MD;  Location: [place];  Service: Open Heart Surgery;  Laterality: N/A;  . TRACHEOSTOMY TUBE PLACEMENT N/A 03/27/2019   Procedure: TRACHEOSTOMY placed using Shiley 8DCT Cuffed.;  Surgeon: Wonda Olds, MD;  Location: [facility];  Service: Thoracic;  Laterality: N/A;    Allergies: Patient has no known allergies.  Medications: Prior to Admission medications   Medication Sig Start Date End Date Taking? Authorizing Provider  acetaminophen (TYLENOL) 325 MG tablet Take 325 mg by mouth every 8 (  eight) hours as needed for moderate pain.    Yes [provider]  atorvastatin (LIPITOR) 10 MG tablet Take 10 mg by mouth.  09/14/18 09/14/19 Yes [provider]  fluticasone (FLONASE) 50 MCG/ACT nasal spray daily as needed.  10/08/14  Yes [provider]  furosemide (LASIX) 20 MG tablet Take 20 mg by mouth daily.  10/13/17  Yes [provider]  loratadine (CLARITIN) 10 MG tablet  Take 10 mg by mouth daily.   Yes [provider]  metoprolol tartrate (LOPRESSOR) 50 MG tablet Take 1 tablet (50 mg total) by mouth 2 (two) times daily. 10/03/18 03/14/19 Yes Strader, Fransisco Hertz, PA-C  pantoprazole (PROTONIX) 40 MG tablet Take 1 tablet (40 mg total) by mouth daily. 09/14/18  Yes Elgergawy, Silver Huguenin, MD  Pediatric Multivitamins-Iron (FLINTSTONES PLUS IRON) chewable tablet Chew 1 tablet by mouth 2 (two) times daily. 08/25/17  Yes Rehman, Mechele Dawley, MD  rivaroxaban (XARELTO) 20 MG TABS tablet Take 1 tablet (20 mg total) by mouth daily with supper. 08/28/17  Yes Rehman, Mechele Dawley, MD  tamsulosin (FLOMAX) 0.4 MG CAPS capsule Take by mouth. 01/15/19  Yes [provider]     Family History  Problem Relation Age of Onset  . CAD Mother 28  . CAD Father 10  . CAD Sister   . CAD Brother     Social History   Socioeconomic History  . Marital status: Married    Spouse name: Not on file  . Number of children: 2  . Years of education: Not on file  . Highest education level: Not on file  Occupational History  . Occupation: Librarian, academic in supply    Comment: [employer]  Tobacco Use  . Smoking status: Former Smoker    Packs/day: 0.25    Years: 50.00    Pack years: 12.50    Types: Cigarettes    Quit date: 02/15/1990    Years since quitting: 29.2  . Smokeless tobacco: Never Used  Substance and Sexual Activity  . Alcohol use: No  . Drug use: No  . Sexual activity: Not Currently  Other Topics Concern  . Not on file  Social History Narrative  . Not on file   Social Determinants of Health   Financial Resource Strain:   . Difficulty of Paying Living Expenses: Not on file  Food Insecurity:   . Worried About Charity fundraiser in the Last Year: Not on file  . Ran Out of Food in the Last Year: Not on file  Transportation Needs:   . Lack of Transportation (Medical): Not on file  . Lack of Transportation (Non-Medical): Not on file  Physical Activity:   . Days of  Exercise per Week: Not on file  . Minutes of Exercise per Session: Not on file  Stress:   . Feeling of Stress : Not on file  Social Connections:   . Frequency of Communication with Friends and Family: Not on file  . Frequency of Social Gatherings with Friends and Family: Not on file  . Attends Religious Services: Not on file  . Active Member of Clubs or Organizations: Not on file  . Attends Archivist Meetings: Not on file  . Marital Status: Not on file     Review of Systems: A 12 point ROS discussed and pertinent positives are indicated in the HPI above.  All other systems are negative.  Review of Systems  Unable to perform ROS: Patient nonverbal    Vital Signs: BP Marland Kitchen)  121/40   Pulse 73   Temp 97.9 F (36.6 C) (Oral)   Resp 16   Ht 5\' 8"  (1.727 m)   Wt 188 lb 15 oz (85.7 kg)   SpO2 99%   BMI 28.73 kg/m   Physical Exam Vitals and nursing note reviewed.  Constitutional:      General: He is not in acute distress.    Appearance: Normal appearance.     Comments: arousable but falls asleep quickly.  Neck:     Comments: L subclavian dialysis catheter in place Cardiovascular:     Rate and Rhythm: Normal rate and regular rhythm.  Pulmonary:     Effort: No respiratory distress.     Comments: Intubated via trach Musculoskeletal:     Cervical back: Normal range of motion and neck supple.  Skin:    General: Skin is warm and dry.      MD Evaluation Airway: WNL Heart: WNL Abdomen: WNL Chest/ Lungs: WNL ASA  Classification: 3 Mallampati/Airway Score: Three   Imaging: CT ABDOMEN PELVIS WO CONTRAST  Result Date: 04/13/2019 CLINICAL DATA:  Hematuria EXAM: CT ABDOMEN AND PELVIS WITHOUT CONTRAST TECHNIQUE: Multidetector CT imaging of the abdomen and pelvis was performed following the standard protocol without IV contrast. COMPARISON:  None. FINDINGS: Motion and streak artifact present. Lower chest: Bilateral pleural effusions, larger on the left with bibasilar  atelectasis. Hepatobiliary: No focal liver abnormality. The gallbladder is contracted. There is no biliary dilatation. Pancreas: Unremarkable Spleen: Unremarkable Adrenals/Urinary Tract: Adrenals are unremarkable. 2 mm nonobstructing calculus of the upper pole of the left kidney. Bladder is partially collapsed with Foley catheter present. There is debris within the bladder dependently. Stomach/Bowel: Stomach is within normal limits. Bowel is normal in caliber. There is distal colonic diverticulosis. Normal appendix. Vascular/Lymphatic: Aortic atherosclerosis. No enlarged abdominal or pelvic lymph nodes. Reproductive: Foci of metallic density at the level of prostate likely secondary to prior cancer treatment. Other: Left inguinal hernia containing fat and sigmoid colon. No ascites or free air. Musculoskeletal: Multilevel degenerative changes of the included spine. IMPRESSION: Dependent debris within the partially decompressed bladder likely reflecting blood products. 2 mm nonobstructing left renal calculus. Colonic diverticulosis. Left inguinal hernia containing sigmoid colon. Bilateral pleural effusions. Electronically Signed   By: Macy Mis M.D.   On: 04/13/2019 11:42   DG Chest 1 View  Result Date: 03/29/2019 CLINICAL DATA:  Follow-up Impella device EXAM: CHEST  1 VIEW COMPARISON:  03/29/2019 FINDINGS: Tracheostomy tube, gastric catheter and right-sided PICC line are again noted and stable in satisfactory position. Impella and Swan-Ganz catheter is well as postoperative changes are again seen and stable. Vascular congestion is again noted as well as bilateral effusions relatively stable from the prior exam. No new focal infiltrate is seen. IMPRESSION: Tubes and lines as described above stable from the prior exam. Stable vascular congestion and pleural effusions. Electronically Signed   By: Inez Catalina M.D.   On: 03/29/2019 12:35   DG Chest 1 View  Result Date: 03/25/2019 CLINICAL DATA:  86 year old  male postoperative day 9 status post CABG, left atrium MAZE procedure. EXAM: CHEST  1 VIEW COMPARISON:  Portable chest 03/24/2019 and earlier. FINDINGS: Portable AP semi upright view at 0409 hours. Enteric feeding tube remains in place, tip not included. Stable right PICC line. Stable lung volumes and ventilation with bibasilar veiling opacity and basilar predominant increased interstitial markings suggesting interstitial edema. No pneumothorax. Stable cardiac size and mediastinal contours. Paucity of bowel gas in the upper abdomen. IMPRESSION: 1.  Stable lines and tubes. 2. Stable ventilation since yesterday with suspected interstitial edema, bilateral pleural effusions and lung base atelectasis. Electronically Signed   By: Genevie Ann M.D.   On: 03/25/2019 06:53   CT CHEST WO CONTRAST  Result Date: 04/20/2019 CLINICAL DATA:  Respiratory failure due to pneumonia and pulmonary edema post tracheostomy 03/27/2019. Evaluate bibasilar airspace opacities. Low-grade fever without leukocytosis. EXAM: CT CHEST WITHOUT CONTRAST TECHNIQUE: Multidetector CT imaging of the chest was performed following the standard protocol without IV contrast. COMPARISON:  Radiographs 04/20/2019 and 04/19/2019. Abdominal CT 04/13/2019, cardiac CT 01/23/2018 and PET-CT 10/31/2017. FINDINGS: Cardiovascular: Atherosclerosis of the aorta, great vessels and coronary arteries post median sternotomy, CABG and left atrial appendage clipping. Left subclavian central venous catheter extends to the upper SVC. There is a right arm PICC which extends to the lower SVC. The heart size is normal. There is minimal pericardial fluid. Mediastinum/Nodes: There are no enlarged mediastinal, hilar or axillary lymph nodes.Small mediastinal lymph nodes are likely chronic. Tracheostomy appears well position. A feeding tube projects beyond the distal stomach, tip not visualized. The thyroid gland, trachea and esophagus demonstrate no significant findings. Lungs/Pleura:  Moderate size dependent pleural effusions bilaterally with associated dependent pulmonary airspace opacities, probably atelectasis. There is a dependent asymmetric component in the left upper lobe which could reflect inflammation. Underlying mild to moderate centrilobular emphysema. No pneumothorax. Upper abdomen: No acute findings are seen within the visualized upper abdomen. The gallbladder is contracted with possible small stones in the gallbladder neck. Musculoskeletal/Chest wall: There is no chest wall mass or suspicious osseous finding. Recent median sternotomy with intact sternotomy wires and no sternal dehiscence or surrounding suspicious soft tissue abnormality. Mild subcutaneous edema within the anterior chest wall. IMPRESSION: 1. Moderate-sized dependent pleural effusions with associated dependent pulmonary airspace opacities, probably atelectasis. Asymmetric dependent component in the left upper lobe could reflect inflammation. 2. Recent median sternotomy, CABG and left atrial appendage clipping. No evidence of sternal dehiscence or surrounding soft tissue abnormality. 3. Aortic Atherosclerosis (ICD10-I70.0) and Emphysema (ICD10-J43.9). Electronically Signed   By: Richardean Sale M.D.   On: 04/20/2019 13:36   DG Chest Port 1 View  Result Date: 04/22/2019 CLINICAL DATA:  86 year old male status post CABG on 03/16/2019. EXAM: PORTABLE CHEST 1 VIEW COMPARISON:  Chest x-ray 04/21/2019. FINDINGS: A tracheostomy tube is in place with tip 8.2 cm above the carina. A feeding tube is seen extending into the abdomen, however, the tip of the feeding tube extends below the lower margin of the image. Left subclavian Vas-Cath with tip terminating in the distal superior vena cava. There is a right upper extremity PICC with tip terminating in the superior cavoatrial junction. Bibasilar opacities (left greater than right) which may reflect areas of atelectasis and/or consolidation. Moderate left and small right pleural  effusions. No evidence of pulmonary edema. Heart size is normal. Upper mediastinal contours are within normal limits. Status post median sternotomy for CABG. Left atrial appendage ligation clip. Surgical clips project over the right subclavian region. IMPRESSION: 1. Support apparatus and postoperative changes, as above. 2. Moderate left and small right pleural effusions with probable basilar atelectasis bilaterally. 3. Aortic atherosclerosis. Electronically Signed   By: Vinnie Langton M.D.   On: 04/22/2019 07:50   DG CHEST PORT 1 VIEW  Result Date: 04/21/2019 CLINICAL DATA:  86 year old male with history of tachycardia. EXAM: PORTABLE CHEST 1 VIEW COMPARISON:  Chest x-ray 04/20/2019. FINDINGS: A tracheostomy tube is in place with tip 8.4 cm above the carina. There is  a right upper extremity PICC with tip terminating in the distal superior vena cava. A feeding tube is seen extending into the abdomen, however, the tip of the feeding tube extends below the lower margin of the image. There is a left-sided subclavian central venous catheter with tip terminating in the distal superior vena cava. Left atrial appendage ligation clip. Status post median sternotomy for CABG. Bibasilar opacities (left greater than right) which may reflect areas of atelectasis and/or consolidation. Moderate left and small right pleural effusions. No evidence of pulmonary edema. Heart size is mildly enlarged. Upper mediastinal contours are within normal limits. Aortic atherosclerosis. IMPRESSION: 1. Postoperative changes and support apparatus, as above. 2. Bibasilar areas of atelectasis and/or consolidation (left greater than right) with moderate left and small right pleural effusions. 3. Aortic atherosclerosis. Electronically Signed   By: Vinnie Langton M.D.   On: 04/21/2019 09:09   DG CHEST PORT 1 VIEW  Result Date: 04/20/2019 CLINICAL DATA:  86 year old male status post tracheostomy. Respiratory failure. History of CABG. EXAM:  PORTABLE CHEST 1 VIEW COMPARISON:  Chest x-ray 04/19/2019. FINDINGS: A tracheostomy tube is in place with tip 7.9 cm above the carina. Left subclavian central venous catheter with tip terminating in the distal superior vena cava. There is a right upper extremity PICC with tip terminating in the superior cavoatrial junction. Bibasilar opacities (left greater than right), which may reflect areas of atelectasis and/or consolidation. Small right and small to moderate left pleural effusions. No pneumothorax. No evidence of pulmonary edema. Heart size appears borderline enlarged. Upper mediastinal contours are within normal limits. Aortic atherosclerosis. Status post median sternotomy for CABG. Left atrial appendage ligation clip noted. Surgical clips projecting over the right subclavian region. IMPRESSION: 1. Support apparatus and postoperative changes, as above. 2. Bibasilar opacities (left greater than right), which may reflect areas of atelectasis and/or consolidation. Small right and small to moderate left pleural effusions. 3. Aortic atherosclerosis. Electronically Signed   By: Vinnie Langton M.D.   On: 04/20/2019 09:01   DG Chest Port 1 View  Result Date: 04/19/2019 CLINICAL DATA:  Respiratory failure. EXAM: PORTABLE CHEST 1 VIEW COMPARISON:  One-view chest x-ray 04/18/2019 FINDINGS: Heart is enlarged. Atherosclerotic changes are noted at the aortic arch. Tracheostomy tube is stable. Right-sided PICC line is stable. Left subclavian catheter is stable. Bilateral pleural effusions and airspace disease are again noted, left greater than right. Aeration is slightly improved. IMPRESSION: Slightly improved aeration with persistent bilateral pleural effusions and airspace disease, left greater than right. Electronically Signed   By: San Morelle M.D.   On: 04/19/2019 06:46   DG CHEST PORT 1 VIEW  Result Date: 04/18/2019 CLINICAL DATA:  Acute respiratory failure. EXAM: PORTABLE CHEST 1 VIEW COMPARISON:   April 17, 2019. FINDINGS: Stable cardiomegaly. Tracheostomy tube is unchanged in position. Left subclavian dialysis catheter is unchanged in position. Right-sided PICC line is unchanged. No pneumothorax is noted. Stable mild left pleural effusion is noted. Stable bibasilar atelectasis or edema is noted. Small right pleural effusion is noted. Bony thorax is unremarkable. IMPRESSION: Stable support apparatus. Stable bibasilar atelectasis or edema is noted with associated pleural effusions. Electronically Signed   By: Marijo Conception M.D.   On: 04/18/2019 09:51   DG Chest Port 1 View  Result Date: 04/17/2019 CLINICAL DATA:  Tracheostomy, recent CABG EXAM: PORTABLE CHEST 1 VIEW COMPARISON:  04/14/2019 FINDINGS: Tracheostomy, right PICC line and left Vas-Cath remain in place, unchanged. Prior CABG. Bilateral pleural effusions, left greater than right. Heart is borderline  in size with vascular congestion and bibasilar atelectasis. Findings similar to prior study. IMPRESSION: Continued bilateral effusions, left greater than right with bibasilar atelectasis and mild vascular congestion. No real change. Electronically Signed   By: Rolm Baptise M.D.   On: 04/17/2019 08:51   DG Chest Port 1 View  Result Date: 04/14/2019 CLINICAL DATA:  History of CABG. EXAM: PORTABLE CHEST 1 VIEW COMPARISON:  04/13/2019 FINDINGS: Cardiopericardial silhouette is at upper limits of normal for size. No pulmonary edema. Bibasilar atelectasis noted with probable small effusions, left greater than right. Right PICC line tip overlies the distal SVC. Left subclavian central line tip overlies the mid SVC. Tracheostomy tube remains in place. Telemetry leads overlie the chest. IMPRESSION: Stable exam. Bibasilar atelectasis with small effusions, left greater than right. Electronically Signed   By: Misty Stanley M.D.   On: 04/14/2019 08:30   DG Chest Port 1 View  Result Date: 04/13/2019 CLINICAL DATA:  Status post coronary artery bypass graft.  EXAM: PORTABLE CHEST 1 VIEW COMPARISON:  April 12, 2019. FINDINGS: Stable cardiomediastinal silhouette. Tracheostomy tube is unchanged in position. Left subclavian catheter is unchanged. Right-sided PICC line is unchanged. No pneumothorax is noted. Right lung is clear. Mild left pleural effusion is noted with associated atelectasis. Bony thorax is unremarkable. IMPRESSION: Stable support apparatus. Mild left pleural effusion is noted with associated atelectasis. Electronically Signed   By: Marijo Conception M.D.   On: 04/13/2019 08:57   DG Chest Port 1 View  Result Date: 04/12/2019 CLINICAL DATA:  Tracheostomy, post CABG, hypertension, prostate cancer EXAM: PORTABLE CHEST 1 VIEW COMPARISON:  Portable exam 0549 hours compared to 04/11/2019 FINDINGS: Tracheostomy tube, nasogastric tube, RIGHT arm PICC line, and LEFT jugular dual-lumen central venous catheter unchanged. Upper normal size of cardiac silhouette post CABG and LEFT atrial appendage clipping. Scattered interstitial infiltrates in the perihilar regions slightly greater on LEFT favor mild pulmonary edema. Atelectasis versus consolidation LEFT lower lobe with small associated LEFT pleural effusion. No pneumothorax. IMPRESSION: Mild pulmonary edema with atelectasis versus consolidation in LEFT lower lobe and small LEFT pleural effusion. Electronically Signed   By: Lavonia Dana M.D.   On: 04/12/2019 09:19   DG CHEST PORT 1 VIEW  Result Date: 04/11/2019 CLINICAL DATA:  Respiratory failure EXAM: PORTABLE CHEST 1 VIEW COMPARISON:  04/10/2019 FINDINGS: Stable positioning of tracheostomy tube. Interval removal of enteric tube. Right-sided PICC line terminates at the level of the distal SVC. Impella device has been removed. Left subclavian central venous catheter stable terminating at the level of the SVC. Prior CABG and atrial appendage clip. Stable cardiomediastinal contours. Persistent opacity at the left lung base likely effusion and associated  atelectasis. Right lung appears clear. No pneumothorax. IMPRESSION: 1. Interval removal of enteric tube and Impella device. 2. Persistent left basilar opacity likely representing effusion and associated atelectasis. Electronically Signed   By: Davina Poke D.O.   On: 04/11/2019 09:44   DG Chest Port 1 View  Result Date: 04/10/2019 CLINICAL DATA:  Tracheostomy, LVAD, CABG EXAM: PORTABLE CHEST 1 VIEW COMPARISON:  Portable exam 0539 hours compared to 04/08/2019 FINDINGS: Tracheostomy tube projects over tracheal air column unchanged. Nasogastric tube extends into abdomen. RIGHT arm PICC line tip projects over cavoatrial junction. Impella device projects over cardiac silhouette unchanged. Dual lumen LEFT subclavian central venous catheter with tip projecting over SVC. Epicardial pacing wires noted. Normal heart size post CABG and Maze procedure. Mediastinal contours normal. Atherosclerotic calcification aorta. Increased LEFT pleural effusion and basilar atelectasis versus consolidation. No  pneumothorax. IMPRESSION: Postoperative changes with tubes and lines as above, grossly unchanged. Increased LEFT pleural effusion and basilar atelectasis. Electronically Signed   By: Lavonia Dana M.D.   On: 04/10/2019 09:06   DG Chest Port 1 View  Result Date: 04/08/2019 CLINICAL DATA:  CABG EXAM: PORTABLE CHEST 1 VIEW COMPARISON:  Yesterday FINDINGS: Impella device from the right with stable positioning. Bilateral central line with tips at the SVC. CABG and left atrial clipping. Haziness of the left more than right chest likely from atelectasis and pleural fluid. No visible pneumothorax. The tracheostomy tube is in good position. IMPRESSION: 1. Stable hardware. 2. Stable mild hazy opacity likely reflecting dependent atelectasis and pleural fluid. Electronically Signed   By: Monte Fantasia M.D.   On: 04/08/2019 06:11   DG Chest Port 1 View  Result Date: 04/07/2019 CLINICAL DATA:  Post CABG EXAM: PORTABLE CHEST 1 VIEW  COMPARISON:  Chest radiograph from one day prior. FINDINGS: Tracheostomy tube tip overlies the tracheal air column at the level of the thoracic inlet. Enteric tube enters stomach with the tip not seen on this image. Right axillary approach Impella device is stable with tip overlying the left heart. Left subclavian central venous catheter terminates in the middle third of the SVC. Stable cardiomediastinal silhouette with top-normal heart size. No pneumothorax. Small bilateral pleural effusions are unchanged. No overt pulmonary edema. Left lung base atelectasis is unchanged. IMPRESSION: 1. No pneumothorax.  Support structures as detailed. 2. Stable small bilateral pleural effusions and left lung base atelectasis. Electronically Signed   By: Ilona Sorrel M.D.   On: 04/07/2019 06:31   DG Chest Port 1 View  Result Date: 04/06/2019 CLINICAL DATA:  Patient with tracheostomy and Impella device EXAM: PORTABLE CHEST 1 VIEW COMPARISON:  April 05, 2019 FINDINGS: The Impella device is stable. The right PICC line is stable. The left central line is in good position terminating in the SVC, unchanged. The tracheostomy tube over right lies the tracheal air column. The heart size is borderline to mildly enlarged. The hila and mediastinum are normal. Haziness in the left base is likely a layering effusion. Mild pulmonary venous congestion not excluded. IMPRESSION: 1. Support apparatus as above. 2. Mild increased interstitial markings stable in the interval likely represent pulmonary venous congestion. There is likely a small layering effusion on the left with underlying atelectasis. Electronically Signed   By: Dorise Bullion III M.D   On: 04/06/2019 07:47   DG Chest Port 1 View  Result Date: 04/05/2019 CLINICAL DATA:  History of CABG. EXAM: PORTABLE CHEST 1 VIEW COMPARISON:  Radiograph yesterday. FINDINGS: Tracheostomy tube tip at the thoracic inlet. 2 enteric tubes remain in place with tip below the diaphragm. Right upper  extremity PICC and left subclavian central line remain in place. Right subclavian Impella unchanged in positioning. Post median sternotomy. Unchanged cardiomegaly post CABG. Left atrial clipping. Hazy bilateral lung opacities, left greater than right, likely related to pleural effusion and adjacent atelectasis. Stable pulmonary vasculature. No new airspace disease. No pneumothorax. IMPRESSION: 1. Stable support apparatus. 2. Stable hazy bilateral lower lung zone opacities likely combination of pleural fluid and atelectasis, left greater than right. Electronically Signed   By: Keith Rake M.D.   On: 04/05/2019 05:22   DG CHEST PORT 1 VIEW  Result Date: 04/04/2019 CLINICAL DATA:  Tracheostomy tube and ventilator EXAM: PORTABLE CHEST 1 VIEW COMPARISON:  Yesterday FINDINGS: Impella in stable position. Postoperative heart with CABG and left atrial clipping. Heart size is normal and stable.  Feeding tube reaches diaphragm. The orogastric tube is more difficult to separately visualized at the level of the diaphragm, likely due to feeding tube overlap. Right PICC and left subclavian line with tips near the upper cavoatrial junction. Tracheostomy tube in place. Haziness of the left lower chest attributed atelectasis and pleural fluid. No Kerley lines. No visible pneumothorax IMPRESSION: 1. Stable hardware positioning as described. 2. Haziness at the left based attributed atelectasis and pleural fluid. Electronically Signed   By: Monte Fantasia M.D.   On: 04/04/2019 09:04   DG CHEST PORT 1 VIEW  Result Date: 04/03/2019 CLINICAL DATA:  Check Impella device EXAM: PORTABLE CHEST 1 VIEW COMPARISON:  04/02/2019 FINDINGS: Cardiac shadow is stable. Postsurgical changes are again seen. Right jugular sheath, right-sided PICC line and left subclavian temporary dialysis catheter are again noted and stable. Tracheostomy tube, nasogastric catheter and feeding catheter are noted as well. Impella device is noted in stable  position. Changes of mild edema and small effusions are again identified. No new focal abnormality is seen. IMPRESSION: Stable appearance of the chest when compared with the prior exam. Electronically Signed   By: Inez Catalina M.D.   On: 04/03/2019 08:36   DG CHEST PORT 1 VIEW  Result Date: 04/02/2019 CLINICAL DATA:  History of CABG. EXAM: PORTABLE CHEST 1 VIEW COMPARISON:  Chest radiograph 04/01/2019 FINDINGS: Tracheostomy tube terminates at the level of the clavicular heads. Unchanged position of a right IJ sheath, left subclavian catheter, right PICC and right subclavian approach Impella device. An enteric tube passes below the level of left hemidiaphragm with tip excluded from the field of view. Sequela of prior CABG and left atrial appendage clipping. The cardiomediastinal silhouette is unchanged. Unchanged layering bilateral pleural effusions. Redemonstrated atelectasis and/or consolidation within the left lung base. No evidence of pneumothorax. No acute bony abnormality. IMPRESSION: Support apparatus as described. Layering bilateral pleural effusions with left basilar atelectasis and/or pneumonia, similar to prior examination. Electronically Signed   By: Kellie Simmering DO   On: 04/02/2019 07:59   DG Chest Port 1 View  Result Date: 04/01/2019 CLINICAL DATA:  Left ventricular assist device in place. EXAM: PORTABLE CHEST 1 VIEW COMPARISON:  03/31/2019 FINDINGS: Tracheostomy tube overlies the airway. A right jugular sheath, left subclavian catheter, right PICC, and right subclavian approach Impella device remain in place. Enteric tubes course into the abdomen with tips not imaged. Sequelae of CABG and left atrial appendage clipping are again identified. The cardiomediastinal silhouette is unchanged. Veiling opacities in the lung bases, left greater than right, suggest small pleural effusions and appear improved from the prior study although this may also reflect differences in patient positioning. There is  persistent asymmetric atelectasis or consolidation in the left lung base. No pneumothorax is identified. IMPRESSION: 1. Support devices as above. 2. Pleural effusions and bibasilar atelectasis with overall improved lung aeration since yesterday. Persistent asymmetric left basilar atelectasis or consolidation. Electronically Signed   By: Logan Bores M.D.   On: 04/01/2019 09:22   DG Chest Port 1 View  Result Date: 03/31/2019 CLINICAL DATA:  Respiratory failure. Atrial fibrillation with rapid ventricular response. EXAM: PORTABLE CHEST 1 VIEW COMPARISON:  Chest x-rays dated 03/30/2019 and 03/29/2019. FINDINGS: Swan-Ganz catheter has been removed. Remaining support apparatus appears appropriately position. Persistent bibasilar opacities, LEFT greater than RIGHT, likely small pleural effusions and/or atelectasis. Superimposed pneumonia cannot be excluded at the LEFT lung base. No pneumothorax seen. IMPRESSION: Persistent bibasilar opacities, LEFT greater than RIGHT, most likely small pleural effusions and/or atelectasis.  Superimposed pneumonia cannot be excluded at the LEFT lung base. Electronically Signed   By: Franki Cabot M.D.   On: 03/31/2019 09:52   DG Chest Port 1 View  Result Date: 03/30/2019 CLINICAL DATA:  Tracheostomy, I LVAD EXAM: PORTABLE CHEST 1 VIEW COMPARISON:  Portable exam 0537 hours compared to 03/29/2019 FINDINGS: Tracheostomy tube, nasogastric tube, dual lumen LEFT subclavian line, RIGHT jugular Swan-Ganz catheter, and RIGHT-side Impella device unchanged. 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 Bibasilar pleural effusions and atelectasis. Upper lungs clear. No pneumothorax. IMPRESSION: Bibasilar pleural effusions and atelectasis. Stable support devices. Electronically Signed   By: Lavonia Dana M.D.   On: 03/30/2019 08:41   DG Chest Port 1 View  Result Date: 03/29/2019 CLINICAL DATA:  Check Impella placement EXAM: PORTABLE CHEST 1 VIEW COMPARISON:  03/27/2018  FINDINGS: Cardiac shadow is enlarged but stable. Postsurgical changes are again noted. Impella device is well as a Swan-Ganz catheter are again seen and stable. Tracheostomy tube, gastric catheter and right-sided PICC line are noted in satisfactory position. Persistent bilateral pleural effusions are noted left slightly greater than right layering posteriorly. Vascular congestion remains. No new focal infiltrate is seen. IMPRESSION: Overall stable appearance of the chest Tubes and lines stable in appearance. Electronically Signed   By: Inez Catalina M.D.   On: 03/29/2019 09:05   DG Chest Port 1 View  Result Date: 03/28/2019 CLINICAL DATA:  CABG.  Tracheostomy.  Impella placement. EXAM: PORTABLE CHEST 1 VIEW COMPARISON:  03/27/2019 FINDINGS: Right-sided Impella device unchanged with tip projecting over the left heart. Tracheostomy tube unchanged. Right IJ Swan-Ganz catheter has tip over the main pulmonary artery segment. Right-sided PICC line unchanged. Enteric tube courses into the region of the stomach and off the film as tip is not visualized. Lungs are adequately inflated demonstrate persistent hazy bilateral perihilar and bibasilar opacification likely mild interstitial edema with small bilateral pleural effusions/bibasilar atelectasis. Cardiomediastinal silhouette and remainder of the exam is unchanged. IMPRESSION: 1. Stable hazy opacification over the perihilar bibasilar regions likely interstitial edema with bilateral effusions/bibasilar atelectasis. 2.  Tubes and lines as described. Electronically Signed   By: Marin Olp M.D.   On: 03/28/2019 08:54   DG Chest Port 1 View  Result Date: 03/27/2019 CLINICAL DATA:  Status post tracheostomy placement EXAM: PORTABLE CHEST 1 VIEW COMPARISON:  03/27/2019 FINDINGS: Cardiac shadow is prominent but stable. Impella device is noted in place. Postsurgical changes are again seen and stable. Right-sided PICC line is noted at the cavoatrial junction. New  tracheostomy tube is seen. Feeding catheter has been removed in the interval. Diffuse haziness is noted over both lungs likely related to a posteriorly layering effusion bilaterally. Mild vascular congestion is again seen. Swan-Ganz catheter is noted in satisfactory position. IMPRESSION: Mild vascular congestion and small effusions posteriorly. Tubes and lines as described. Electronically Signed   By: Inez Catalina M.D.   On: 03/27/2019 13:16   DG Chest Port 1 View  Result Date: 03/27/2019 CLINICAL DATA:  Status post coronary bypass graft EXAM: PORTABLE CHEST 1 VIEW COMPARISON:  March 26, 2019. FINDINGS: Stable cardiomegaly with central pulmonary vascular congestion. Endotracheal tube is in good position. Enteric tube is noted which appears to extend into stomach. Right-sided PICC line is unchanged. No pneumothorax is noted. Stable bilateral lung opacities are noted concerning for edema atelectasis or inflammation with associated small pleural effusions. Bony thorax is unremarkable. IMPRESSION: Stable support apparatus. Stable bilateral lung opacities are noted concerning for edema or inflammation with associated  small pleural effusions. Electronically Signed   By: Marijo Conception M.D.   On: 03/27/2019 09:07   DG Chest Port 1 View  Result Date: 03/26/2019 CLINICAL DATA:  86 year old male postoperative day 10 status post CABG, left atrium MAZE procedure. EXAM: PORTABLE CHEST 1 VIEW COMPARISON:  Portable chest 03/25/2019 and earlier. FINDINGS: Portable AP supine view at 0559 hours. Intubated. Endotracheal tube tip in good position just below the clavicles. Stable visible enteric tube, tip not included. Stable right PICC line. Stable cardiomegaly and mediastinal contours. Stable pulmonary vascularity, no overt edema suspected. Veiling left greater than right lung base and dense retrocardiac opacity appears stable. No pneumothorax. There is a minimally displaced anterior left 2nd rib fracture which is stable in  retrospect. IMPRESSION: 1. Intubated with ET tube tip in good position. Other lines and tubes appear stable. 2. Stable ventilation with evidence of bilateral pleural effusions and lower lobe collapse or consolidation. 3. Minimally displaced anterior left 2nd rib fracture, stable across this recent series of exams. Electronically Signed   By: Genevie Ann M.D.   On: 03/26/2019 06:31   DG Chest Port 1 View  Result Date: 03/24/2019 CLINICAL DATA:  Status post CABG. EXAM: PORTABLE CHEST 1 VIEW COMPARISON:  03/23/2019 FINDINGS: CABG changes again noted. An endotracheal tube and NG tube have been removed. A small bore feeding tube entering the UPPER abdomen and RIGHT PICC line with tip overlying the SUPERIOR cavoatrial junction again noted. Pulmonary vascular congestion with LEFT LOWER lung consolidation/atelectasis, RIGHT basilar atelectasis and probable effusions again noted and may be minimally increased. There is no evidence of pneumothorax. IMPRESSION: Equivocal slight increase in pulmonary vascular congestion, LEFT LOWER lung consolidation/atelectasis, RIGHT basilar atelectasis and probable effusions. Endotracheal tube and NG tube removal. Electronically Signed   By: Margarette Canada M.D.   On: 03/24/2019 06:26   DG Abd Portable 1V  Result Date: 04/11/2019 CLINICAL DATA:  Enteric catheter placement EXAM: PORTABLE ABDOMEN - 1 VIEW COMPARISON:  03/30/2019 FINDINGS: Frontal view of the lower chest and upper abdomen demonstrates an enteric catheter tip projecting over gastric antrum. Epicardial pacing wires are noted. Postsurgical changes from median sternotomy. Bowel gas pattern is unremarkable. Consolidation and/or effusion at the left lung base slightly improved. IMPRESSION: 1. Enteric catheter overlying gastric antrum. Electronically Signed   By: Randa Ngo M.D.   On: 04/11/2019 19:02   DG Abd Portable 1V  Result Date: 03/30/2019 CLINICAL DATA:  NG tube placement EXAM: PORTABLE ABDOMEN - 1 VIEW COMPARISON:   03/30/2019 FINDINGS: Feeding tube is in place with the tip at the ligament of Treitz. Nonobstructive bowel gas pattern. IMPRESSION: Feeding tube tip in the distal duodenum near the ligament of Treitz. Electronically Signed   By: Rolm Baptise M.D.   On: 03/30/2019 18:25   DG Abd Portable 1V  Result Date: 03/30/2019 CLINICAL DATA:  Feeding tube placement EXAM: PORTABLE ABDOMEN - 1 VIEW COMPARISON:  03/27/2019 FINDINGS: Lung bases with persistent effusions. Partial imaging of Swan-Ganz catheter, Impella device and left atrial clipping changes. Nasogastric tube in the proximal to mid stomach, side port below expected location the GE junction. Feeding tube in the distal stomach and or pyloric channel. Pacer leads and external leads project over the patient as before Scattered loops of gas-filled small bowel are present. Spinal degenerative changes without acute bone process. IMPRESSION: Interval placement of feeding tube tip in the antropyloric stomach. Chest findings better illustrated on chest x-ray acquired earlier the same date. Nasogastric tube remains in place. Nonspecific  bowel gas pattern could reflect mild ileus. Electronically Signed   By: Zetta Bills M.D.   On: 03/30/2019 11:53   DG Abd Portable 1V  Result Date: 03/27/2019 CLINICAL DATA:  NG tube placement. EXAM: PORTABLE ABDOMEN - 1 VIEW COMPARISON:  Radiograph earlier this day FINDINGS: The previous weighted enteric tube is been removed. There is a new enteric tube with tip and side-port below the diaphragm in the distal stomach. Swan-Ganz catheter and intra-aortic balloon pump are partially included in the lower chest. Nonobstructive bowel gas pattern. IMPRESSION: Tip and side port of the enteric tube below the diaphragm in the distal stomach. Electronically Signed   By: Keith Rake M.D.   On: 03/27/2019 13:46   DG Abd Portable 1V  Result Date: 03/27/2019 CLINICAL DATA:  Blocks feeding tube. EXAM: PORTABLE ABDOMEN - 1 VIEW COMPARISON:   None. FINDINGS: The bowel gas pattern is normal. Feeding tube is seen looped within the stomach with distal tip in the expected position of the midportion of the stomach. No radio-opaque calculi or other significant radiographic abnormality are seen. IMPRESSION: Feeding tube tip seen in expected position of stomach. No evidence of bowel obstruction or ileus. Electronically Signed   By: Marijo Conception M.D.   On: 03/27/2019 09:08   DG Fluoro Guide CV Line-No Report  Result Date: 03/27/2019 Fluoroscopy was utilized by the requesting physician.  No radiographic interpretation.   ECHO INTRAOPERATIVE TEE  Result Date: 03/27/2019  *INTRAOPERATIVE TRANSESOPHAGEAL REPORT *  Patient Name:   EARLIN SWEEDEN Date of Exam: 03/27/2019 Medical Rec #:  [id number]    Height:       68.0 in Accession #:    [id number]   Weight:       221.6 lb Date of Birth:  March 26, 1928   BSA:          2.13 m Patient Age:    63 years     BP:           127/54 mmHg Patient Gender: M            HR:           103 bpm. Exam Location:  Inpatient Transesophogeal exam was perform intraoperatively during surgical procedure. Patient was closely monitored under general anesthesia during the entirety of examination. Indications:     414.8 cardiomyopathy Performing Phys: [id number] BROADUS Z ATKINS Complications: No known complications during this procedure. POST-OP IMPRESSIONS - Left Ventricle: The left ventricle is unchanged from pre-bypass. - Aortic Valve: Impella in situ in good position. PRE-OP FINDINGS  Left Ventricle: The left ventricle has low normal systolic function, with an ejection fraction of 50-55%. The cavity size was normal. There is mild concentric left ventricular hypertrophy. Right Ventricle: The right ventricle has normal systolic function. The cavity was normal. There is no increase in right ventricular wall thickness. Left Atrium: Left atrial size was normal in size. The left atrial appendage is well visualized and there is no evidence of  thrombus present. Right Atrium: Right atrial size was normal in size. Right atrial pressure is estimated at 10 mmHg. Interatrial Septum: No atrial level shunt detected by color flow Doppler. Pericardium: There is no evidence of pericardial effusion. Mitral Valve: The mitral valve is normal in structure. Mitral valve regurgitation is mild to moderate by color flow Doppler. The MR jet is centrally-directed. Tricuspid Valve: The tricuspid valve was normal in structure. Tricuspid valve regurgitation is mild by color flow Doppler. Aortic Valve: The aortic valve is tricuspid  Aortic valve regurgitation was not visualized by color flow Doppler. There is no stenosis of the aortic valve. Pulmonic Valve: The pulmonic valve was not assessed. Pulmonic valve regurgitation is not visualized by color flow Doppler. Aorta: The aortic root, ascending aorta and aortic arch are normal in size and structure.  Lillia Abed MD Electronically signed by Lillia Abed MD Signature Date/Time: 03/27/2019/5:39:33 PM    Final    VAS [place] UPPER EXTREMITY VENOUS DUPLEX  Result Date: 04/14/2019 UPPER VENOUS STUDY  Indications: Edema Limitations: Bandages and trach collar. Comparison Study: No prior study on file Performing Technologist: Sharion Dove RVS  Examination Guidelines: A complete evaluation includes B-mode imaging, spectral Doppler, color Doppler, and power Doppler as needed of all accessible portions of each vessel. Bilateral testing is considered an integral part of a complete examination. Limited examinations for reoccurring indications may be performed as noted.  Right Findings: +----------+------------+---------+-----------+----------+-------+ RIGHT     CompressiblePhasicitySpontaneousPropertiesSummary +----------+------------+---------+-----------+----------+-------+ IJV           Full       Yes       Yes                      +----------+------------+---------+-----------+----------+-------+ Subclavian                Yes       Yes                      +----------+------------+---------+-----------+----------+-------+ Axillary      Full       Yes       Yes                      +----------+------------+---------+-----------+----------+-------+ Brachial      Full       Yes       Yes                      +----------+------------+---------+-----------+----------+-------+ Radial        Full                                          +----------+------------+---------+-----------+----------+-------+ Ulnar         Full                                          +----------+------------+---------+-----------+----------+-------+ Cephalic      Full                                          +----------+------------+---------+-----------+----------+-------+ Basilic       Full                                          +----------+------------+---------+-----------+----------+-------+  Left Findings: +----------+------------+---------+-----------+----------+---------------------+ LEFT      CompressiblePhasicitySpontaneousProperties       Summary        +----------+------------+---------+-----------+----------+---------------------+ IJV                      Yes       Yes                                    +----------+------------+---------+-----------+----------+---------------------+  Subclavian                                             Not visualized     +----------+------------+---------+-----------+----------+---------------------+ Axillary      Full       Yes       Yes                                    +----------+------------+---------+-----------+----------+---------------------+ Brachial      Full       Yes       Yes                                    +----------+------------+---------+-----------+----------+---------------------+ Radial        Full                                                         +----------+------------+---------+-----------+----------+---------------------+ Ulnar         Full                                                        +----------+------------+---------+-----------+----------+---------------------+ Cephalic      Full                                                        +----------+------------+---------+-----------+----------+---------------------+ Basilic       None                                   Acute, localized to                                                             [facility] only        +----------+------------+---------+-----------+----------+---------------------+  Summary:  Right: No evidence of deep vein thrombosis in the upper extremity. No evidence of superficial vein thrombosis in the upper extremity.  Left: No evidence of deep vein thrombosis in the upper extremity. Findings consistent with acute superficial vein thrombosis involving the left basilic vein.  *See table(s) above for measurements and observations.  Diagnosing physician: Servando Snare MD Electronically signed by Servando Snare MD on 04/14/2019 at 3:57:34 PM.    Final     Labs:  CBC: Recent Labs    04/19/19 0401 04/19/19 0402 04/20/19 0530 04/20/19 2102 04/21/19 0250 04/21/19 9528 04/22/19 0500 04/22/19 0501 04/22/19 0559 04/22/19 0603 04/22/19 0817 04/22/19 0822  WBC 11.8*  --  9.1  --  8.0  --  10.1  --   --   --   --   --   HGB 8.5*   < > 8.6*   < > 8.4*   < > 7.3*   < > 7.5* 8.5* 8.5* 7.8*  HCT 28.1*   < > 27.9*   < > 26.3*   < > 24.2*   < > 22.0* 25.0* 25.0* 23.0*  PLT 255  --  216  --  196  --  226  --   --   --   --   --    < > = values in this interval not displayed.    COAGS: Recent Labs    09/12/18 0936 03/14/19 1119 03/15/19 1453 03/16/19 0444 03/16/19 1722 03/27/19 1255 03/28/19 0417 04/03/19 0406 04/04/19 0355 04/05/19 0505 04/06/19 0404  INR 1.8*  --  1.2  --  1.6* 1.7*  --   --   --   --   --   APTT 44*   < >  --    < >  38*  --    < > 90* 99* 45* 55*   < > = values in this interval not displayed.    BMP: Recent Labs    04/20/19 1652 04/20/19 2102 04/21/19 0313 04/21/19 0328 04/21/19 1652 04/21/19 1709 04/22/19 0500 04/22/19 0501 04/22/19 0559 04/22/19 0603 04/22/19 0817 04/22/19 0822  NA 135   < > 133*   < > 135   < > 139   < > 134* 139 138 135  K 4.4   < > 3.9   < > 4.3   < > 4.0   < > 4.0 4.0 4.0 4.0  CL 83*   < > 87*   < > 90*   < > 92*   < > 89* 90* 89* 87*  CO2 39*  --  34*  --  33*  --  36*  --   --   --   --   --   GLUCOSE 155*   < > 238*   < > 264*   < > 132*   < > 191* 145* 180* 210*  BUN 63*   < > 48*   < > 36*   < > 29*   < > 28* 25* 24* 27*  CALCIUM 8.3*  --  11.3*  --  10.0  --  7.0*  --   --   --   --   --   CREATININE 3.14*   < > 2.39*   < > 1.95*   < > 1.61*   < > 1.60* 1.30* 1.40* 1.60*  GFRNONAA 17*  --  23*  --  30*  --  38*  --   --   --   --   --   GFRAA 19*  --  27*  --  35*  --  44*  --   --   --   --   --    < > = values in this interval not displayed.    LIVER FUNCTION TESTS: Recent Labs    04/18/19 0356 04/19/19 0401 04/20/19 0530 04/20/19 0530 04/20/19 1652 04/21/19 0313 04/21/19 1652 04/22/19 0500  BILITOT 0.6  --  0.8  --   --  0.7  --  0.4  AST 16  --  23  --   --  19  --  22  ALT 7  --  10  --   --  9  --  11  ALKPHOS 67  --  64  --   --  60  --  61  PROT 4.6*  --  4.9*  --   --  5.1*  --  5.1*  ALBUMIN 1.8*   < > 2.2*  2.2*   < > 2.1* 2.3* 2.5* 2.6*   < > = values in this interval not displayed.    TUMOR MARKERS: No results for input(s): AFPTM, CEA, CA199, CHROMGRNA in the last 8760 hours.  Assessment and Plan: Renal failure Patient with cardiogenic shock after CAD requiring CABG, left atrial appendage clipping 03/15/19, h/o of chronic systolic heart failure. Now with prolonged hospitalization due to vent/trach dependence, rapid a fib, pressor requirement, and acute renal failure.  He has been receive HD via L subclavian temp cath placed  2/11.  IR consulted for transition to tunneled dialysis catheter.   Per RN, patient can come off CRRT tomorrow for procedure.  No other meds going through line.   For possible PleurX catheter placement with TCTS as well?  He is reportedly able to communicate, however found groggy today after recent medication administration.   Spoke with son at bedside.   Risks and benefits discussed with the patient's son including, but not limited to bleeding, infection, vascular injury, pneumothorax which may require chest tube placement, air embolism or even death  All questions were answered, patient is agreeable to proceed. Consent signed and in chart.  Thank you for this interesting consult.  I greatly enjoyed meeting Jesse Murphy and look forward to participating in their care.  A copy of this report was sent to the requesting provider on this date.  Electronically Signed: Docia Barrier, PA 04/22/2019, 12:46 PM   I spent a total of 40 Minutes    in face to face in clinical consultation, greater than 50% of which was counseling/coordinating care for renal failure.

## 2019-04-22 NOTE — Progress Notes (Signed)
Patient ID: Jesse Murphy, male   DOB: May 11, 1928, 86 y.o.   MRN: [id number]    Advanced Heart Failure Rounding Note  PCP-Cardiologist: Kate Sable, MD   Subjective:    Impella removed 2/23. Intraoperative TEE showed EF ~40%  Remained stable on trach collar with speaking valve in place.   Remains on CVVHD pulling -100.  CVP 7-8.  On norepinephrine at 3 which is being weaned.  Remains in sinus rhythm on IV amnio.  Minimal urine output.  Has developed recurrent hematuria.   Objective:   Weight Range: 85.7 kg Body mass index is 28.73 kg/m.   Vital Signs:   Temp:  [97.7 F (36.5 C)-98.2 F (36.8 C)] 98 F (36.7 C) (03/07 0800) Pulse Rate:  [71-87] 75 (03/07 1034) Resp:  [14-35] 14 (03/07 1034) BP: (58-145)/(32-101) 130/43 (03/07 1034) SpO2:  [85 %-100 %] 97 % (03/07 1034) FiO2 (%):  [40 %] 40 % (03/07 1023) Weight:  [85.7 kg] 85.7 kg (03/07 0600) Last BM Date: 04/22/19  Weight change: Filed Weights   04/19/19 0500 04/20/19 0500 04/22/19 0600  Weight: 85.8 kg 87 kg 85.7 kg    Intake/Output:   Intake/Output Summary (Last 24 hours) at 04/22/2019 1108 Last data filed at 04/22/2019 1100 Gross per 24 hour  Intake 7577.24 ml  Output 7726 ml  Net -148.76 ml      Physical Exam   General:  Elderly male. Lying in bed on TC. No resp difficulty HEENT: normal Neck: supple. no JVD.plus trach collar carotids 2+ bilat; no bruits. No lymphadenopathy or thryomegaly appreciated. Cor: PMI nondisplaced. Regular rate & rhythm. No rubs, gallops or murmurs.  Sternal wound okay.  Left subclavian dialysis catheter Lungs: clear Abdomen: soft, nontender, nondistended. No hepatosplenomegaly. No bruits or masses. Good bowel sounds. Extremities: no cyanosis, clubbing, rash, 1+ edema Neuro: alert & orientedx3, cranial nerves grossly intact. moves all 4 extremities w/o difficulty. Affect pleasant   Telemetry   NSR 70 to 80s personally reviewed   Labs    CBC Recent Labs   04/21/19 0250 04/21/19 0328 04/22/19 0500 04/22/19 0501 04/22/19 0559 04/22/19 0603  WBC 8.0  --  10.1  --   --   --   HGB 8.4*   < > 7.3*   < > 7.5* 8.5*  HCT 26.3*   < > 24.2*   < > 22.0* 25.0*  MCV 96.0  --  97.6  --   --   --   PLT 196  --  226  --   --   --    < > = values in this interval not displayed.   Basic Metabolic Panel Recent Labs    04/21/19 0313 04/21/19 0328 04/21/19 1652 04/21/19 1709 04/22/19 0500 04/22/19 0501 04/22/19 0559 04/22/19 0603  NA 133*   < > 135   < > 139   < > 134* 139  K 3.9   < > 4.3   < > 4.0   < > 4.0 4.0  CL 87*   < > 90*   < > 92*   < > 89* 90*  CO2 34*   < > 33*  --  36*  --   --   --   GLUCOSE 238*   < > 264*   < > 132*   < > 191* 145*  BUN 48*   < > 36*   < > 29*   < > 28* 25*  CREATININE 2.39*   < >  1.95*   < > 1.61*   < > 1.60* 1.30*  CALCIUM 11.3*   < > 10.0  --  7.0*  --   --   --   MG 1.9  --   --   --  1.9  --   --   --   PHOS 1.6*   < > 1.3*  --  1.4*  --   --   --    < > = values in this interval not displayed.   Liver Function Tests Recent Labs    04/21/19 0313 04/21/19 0313 04/21/19 1652 04/22/19 0500  AST 19  --   --  22  ALT 9  --   --  11  ALKPHOS 60  --   --  61  BILITOT 0.7  --   --  0.4  PROT 5.1*  --   --  5.1*  ALBUMIN 2.3*   < > 2.5* 2.6*   < > = values in this interval not displayed.   Recent Labs    04/20/19 0530  AMYLASE 19*   Cardiac Enzymes No results for input(s): CKTOTAL, CKMB, CKMBINDEX, TROPONINI in the last 72 hours.  BNP: BNP (last 3 results) Recent Labs    03/22/19 0401 03/26/19 0626 03/27/19 1255  BNP 340.4* 687.5* 800.4*    ProBNP (last 3 results) No results for input(s): PROBNP in the last 8760 hours.   D-Dimer No results for input(s): DDIMER in the last 72 hours. Hemoglobin A1C No results for input(s): HGBA1C in the last 72 hours. Fasting Lipid Panel No results for input(s): CHOL, HDL, LDLCALC, TRIG, CHOLHDL, LDLDIRECT in the last 72 hours. Thyroid Function Tests  No results for input(s): TSH, T4TOTAL, T3FREE, THYROIDAB in the last 72 hours.  Invalid input(s): FREET3  Other results:   Imaging    DG Chest Port 1 View  Result Date: 04/22/2019 CLINICAL DATA:  86 year old male status post CABG on 03/16/2019. EXAM: PORTABLE CHEST 1 VIEW COMPARISON:  Chest x-ray 04/21/2019. FINDINGS: A tracheostomy tube is in place with tip 8.2 cm above the carina. A feeding tube is seen extending into the abdomen, however, the tip of the feeding tube extends below the lower margin of the image. Left subclavian Vas-Cath with tip terminating in the distal superior vena cava. There is a right upper extremity PICC with tip terminating in the superior cavoatrial junction. Bibasilar opacities (left greater than right) which may reflect areas of atelectasis and/or consolidation. Moderate left and small right pleural effusions. No evidence of pulmonary edema. Heart size is normal. Upper mediastinal contours are within normal limits. Status post median sternotomy for CABG. Left atrial appendage ligation clip. Surgical clips project over the right subclavian region. IMPRESSION: 1. Support apparatus and postoperative changes, as above. 2. Moderate left and small right pleural effusions with probable basilar atelectasis bilaterally. 3. Aortic atherosclerosis. Electronically Signed   By: Vinnie Langton M.D.   On: 04/22/2019 07:50     Medications:     Scheduled Medications: . aspirin  81 mg Oral Daily  . atorvastatin  80 mg Oral q1800  . chlorhexidine gluconate (MEDLINE KIT)  15 mL Mouth Rinse BID  . Chlorhexidine Gluconate Cloth  6 each Topical Q0600  . Chlorhexidine Gluconate Cloth  6 each Topical Q0600  . darbepoetin (ARANESP) injection - NON-DIALYSIS  60 mcg Subcutaneous Q Tue-1800  . feeding supplement (NEPRO CARB STEADY)  237 mL Oral TID BM  . feeding supplement (PRO-STAT SUGAR FREE 64)  60 mL  Per Tube BID  . Gerhardt's butt cream   Topical BID  . hydrocortisone   Rectal BID   . insulin aspart  0-24 Units Subcutaneous Q4H  . insulin glargine  36 Units Subcutaneous Daily  . mouth rinse  15 mL Mouth Rinse 10 times per day  . metoCLOPramide (REGLAN) injection  5 mg Intravenous Q8H  . midodrine  15 mg Oral TID WC  . multivitamin  1 tablet Oral QHS  . pantoprazole (PROTONIX) IV  40 mg Intravenous Q24H  . simethicone  80 mg Oral BID PC  . sodium chloride flush  10-40 mL Intracatheter Q12H    Infusions: . sodium chloride    . sodium chloride    . sodium chloride    . amiodarone 30 mg/hr (04/22/19 1100)  . calcium gluconate infusion for CRRT 40 mL/hr at 04/22/19 0700  . cefTRIAXone (ROCEPHIN)  IV Stopped (04/21/19 1748)  . feeding supplement (OSMOLITE 1.2 CAL) 50 mL/hr at 04/22/19 0700  . magnesium sulfate bolus IVPB    . norepinephrine (LEVOPHED) Adult infusion 3 mcg/min (04/22/19 1100)  . prismasol B22GK 4/0 300 mL/hr at 04/22/19 0528  . prismasol B22GK 4/0 1,500 mL/hr at 04/22/19 0908  . sodium citrate 2 %/dextrose 2.5% solution 3000 mL 250 mL/hr at 04/21/19 1803    PRN Medications: sodium chloride, sodium chloride, sodium chloride, oxyCODONE **AND** acetaminophen, dextrose, fentaNYL (SUBLIMAZE) injection, heparin, hyoscyamine, levalbuterol, lidocaine (PF), lidocaine-prilocaine, loperamide HCl, ondansetron (ZOFRAN) IV, pentafluoroprop-tetrafluoroeth, promethazine, Resource ThickenUp Clear, sodium chloride, sodium chloride flush, sodium chloride flush, sodium chloride flush    Assessment/Plan   1. Acute systolic HF -> Cardiogenic shock - post-op echo on 03/20/19 EF 25-30% (pre-op 30-35%. In 12/2017 EF normal) - Required Impella support post CABG. Impella removed 2/23 - failed iHD on 3/1.  - Back on CVVHD pulling -100.  CVP 7-8 - Remains on NE for BP support but down to 3.  Hopefully can wean off soon.  Also on midodrine 15 tid - Liberalize salt in diet. + TED hose.  2. CAD s/p CABG this admit (LIMA->LAD, SVG->OM, SVG->RCA on 03/15/19) - No signs/symptoms  of ischemia - Continue ASA +statin  3. Acute hypoxic respiratory failure with Pseudomonas PNA - s/p trach on 03/27/19 - on TC with PMV.   - completed meropenem for pseudomonas PNA - trach aspirate now growing Pseudomonas again.  Unclear if this is colonization or active infection.  He is not febrile. - on ceftriaxone.  Will discuss with pharmacy regarding switching to ciprofloxacin or stopping antibiotics and following  4. PAF - s/p MAZE and LAA occlusion - Has been in/out AF. Now in NSR - Continue IV amio 30 mg per hour -Off systemic heparin with hematuria and previous melena.   5. AKI  - due to shock/ATN - On CVVHD. Making minimal urine - For tunneled cath and Pleurx catheter tomorrow   6. Hematuria -Has recurred.  Back on CBI.  7. Melena - Resolved  8. Anemia -Watch H&H closely.  Transfuse hemoglobin less than 7.5  9. Debility, severe - continue PT  10 UE swelling - u/s negative for DVT.  - Continue wraps.   11. F/E/N - Tube feeds per Cor-trak  12. Left Pleural Effusion  - moderate to large.  Pleurx cath tomorrow.    CRITICAL CARE Performed by: Glori Bickers  Total critical care time: 35 minutes  Critical care time was exclusive of separately billable procedures and treating other patients.  Critical care was necessary to treat or prevent imminent  or life-threatening deterioration.  Critical care was time spent personally by me (independent of midlevel providers or residents) on the following activities: development of treatment plan with patient and/or surrogate as well as nursing, discussions with consultants, evaluation of patient's response to treatment, examination of patient, obtaining history from patient or surrogate, ordering and performing treatments and interventions, ordering and review of laboratory studies, ordering and review of radiographic studies, pulse oximetry and re-evaluation of patient's condition.     Glori Bickers, MD   11:08 AM

## 2019-04-23 ENCOUNTER — Inpatient Hospital Stay (HOSPITAL_COMMUNITY): Payer: Medicare Other

## 2019-04-23 ENCOUNTER — Inpatient Hospital Stay (HOSPITAL_COMMUNITY): Payer: Medicare Other | Admitting: Certified Registered"

## 2019-04-23 ENCOUNTER — Encounter (HOSPITAL_COMMUNITY): Payer: Self-pay | Admitting: Cardiothoracic Surgery

## 2019-04-23 ENCOUNTER — Encounter (HOSPITAL_COMMUNITY)
Admission: EM | Disposition: A | Discharge status: Rehabilitation Facility | Payer: Self-pay | Source: Home / Self Care | Attending: Cardiothoracic Surgery

## 2019-04-23 DIAGNOSIS — J90 Pleural effusion, not elsewhere classified: Secondary | ICD-10-CM

## 2019-04-23 HISTORY — PX: IR FLUORO GUIDE CV LINE LEFT: IMG2282

## 2019-04-23 HISTORY — PX: IR US GUIDE VASC ACCESS LEFT: IMG2389

## 2019-04-23 HISTORY — PX: CHEST TUBE INSERTION: SHX231

## 2019-04-23 LAB — GLUCOSE, CAPILLARY
Glucose-Capillary: 116 mg/dL — ABNORMAL HIGH (ref 70–99)
Glucose-Capillary: 27 mg/dL — CL (ref 70–99)
Glucose-Capillary: 112 mg/dL — ABNORMAL HIGH (ref 70–99)
Glucose-Capillary: 70 mg/dL (ref 70–99)
Glucose-Capillary: 101 mg/dL — ABNORMAL HIGH (ref 70–99)
Glucose-Capillary: 158 mg/dL — ABNORMAL HIGH (ref 70–99)
Glucose-Capillary: 126 mg/dL — ABNORMAL HIGH (ref 70–99)

## 2019-04-23 LAB — COMPREHENSIVE METABOLIC PANEL
ALT: 11 U/L (ref 0–44)
AST: 23 U/L (ref 15–41)
Albumin: 2.5 g/dL — ABNORMAL LOW (ref 3.5–5.0)
Alkaline Phosphatase: 65 U/L (ref 38–126)
Anion gap: 13 (ref 5–15)
BUN: 26 mg/dL — ABNORMAL HIGH (ref 8–23)
CO2: 33 mmol/L — ABNORMAL HIGH (ref 22–32)
Calcium: 10.1 mg/dL (ref 8.9–10.3)
Chloride: 90 mmol/L — ABNORMAL LOW (ref 98–111)
Creatinine, Ser: 1.5 mg/dL — ABNORMAL HIGH (ref 0.61–1.24)
GFR calc Af Amer: 47 mL/min — ABNORMAL LOW (ref >60)
GFR calc non Af Amer: 41 mL/min — ABNORMAL LOW (ref >60)
Glucose, Bld: 193 mg/dL — ABNORMAL HIGH (ref 70–99)
Potassium: 4.2 mmol/L (ref 3.5–5.1)
Sodium: 136 mmol/L (ref 135–145)
Total Bilirubin: 0.4 mg/dL (ref 0.3–1.2)
Total Protein: 5.5 g/dL — ABNORMAL LOW (ref 6.5–8.1)

## 2019-04-23 LAB — RENAL FUNCTION PANEL
Albumin: 2.3 g/dL — ABNORMAL LOW (ref 3.5–5.0)
Anion gap: 12 (ref 5–15)
BUN: 43 mg/dL — ABNORMAL HIGH (ref 8–23)
CO2: 32 mmol/L (ref 22–32)
Calcium: 6.8 mg/dL — ABNORMAL LOW (ref 8.9–10.3)
Chloride: 89 mmol/L — ABNORMAL LOW (ref 98–111)
Creatinine, Ser: 2.37 mg/dL — ABNORMAL HIGH (ref 0.61–1.24)
GFR calc Af Amer: 27 mL/min — ABNORMAL LOW (ref >60)
GFR calc non Af Amer: 24 mL/min — ABNORMAL LOW (ref >60)
Glucose, Bld: 212 mg/dL — ABNORMAL HIGH (ref 70–99)
Phosphorus: 4.1 mg/dL (ref 2.5–4.6)
Potassium: 4.5 mmol/L (ref 3.5–5.1)
Sodium: 133 mmol/L — ABNORMAL LOW (ref 135–145)

## 2019-04-23 LAB — POCT I-STAT, CHEM 8
BUN: 22 mg/dL (ref 8–23)
BUN: 24 mg/dL — ABNORMAL HIGH (ref 8–23)
BUN: 25 mg/dL — ABNORMAL HIGH (ref 8–23)
BUN: 27 mg/dL — ABNORMAL HIGH (ref 8–23)
Calcium, Ion: 0.36 mmol/L — CL (ref 1.15–1.40)
Calcium, Ion: 0.37 mmol/L — CL (ref 1.15–1.40)
Calcium, Ion: 1.17 mmol/L (ref 1.15–1.40)
Calcium, Ion: 1.38 mmol/L (ref 1.15–1.40)
Chloride: 86 mmol/L — ABNORMAL LOW (ref 98–111)
Chloride: 87 mmol/L — ABNORMAL LOW (ref 98–111)
Chloride: 88 mmol/L — ABNORMAL LOW (ref 98–111)
Chloride: 89 mmol/L — ABNORMAL LOW (ref 98–111)
Creatinine, Ser: 1.2 mg/dL (ref 0.61–1.24)
Creatinine, Ser: 1.3 mg/dL — ABNORMAL HIGH (ref 0.61–1.24)
Creatinine, Ser: 1.4 mg/dL — ABNORMAL HIGH (ref 0.61–1.24)
Creatinine, Ser: 1.5 mg/dL — ABNORMAL HIGH (ref 0.61–1.24)
Glucose, Bld: 144 mg/dL — ABNORMAL HIGH (ref 70–99)
Glucose, Bld: 145 mg/dL — ABNORMAL HIGH (ref 70–99)
Glucose, Bld: 176 mg/dL — ABNORMAL HIGH (ref 70–99)
Glucose, Bld: 209 mg/dL — ABNORMAL HIGH (ref 70–99)
HCT: 22 % — ABNORMAL LOW (ref 39.0–52.0)
HCT: 23 % — ABNORMAL LOW (ref 39.0–52.0)
HCT: 25 % — ABNORMAL LOW (ref 39.0–52.0)
HCT: 26 % — ABNORMAL LOW (ref 39.0–52.0)
Hemoglobin: 7.5 g/dL — ABNORMAL LOW (ref 13.0–17.0)
Hemoglobin: 7.8 g/dL — ABNORMAL LOW (ref 13.0–17.0)
Hemoglobin: 8.5 g/dL — ABNORMAL LOW (ref 13.0–17.0)
Hemoglobin: 8.8 g/dL — ABNORMAL LOW (ref 13.0–17.0)
Potassium: 3.9 mmol/L (ref 3.5–5.1)
Potassium: 4 mmol/L (ref 3.5–5.1)
Potassium: 4.1 mmol/L (ref 3.5–5.1)
Potassium: 4.1 mmol/L (ref 3.5–5.1)
Sodium: 132 mmol/L — ABNORMAL LOW (ref 135–145)
Sodium: 133 mmol/L — ABNORMAL LOW (ref 135–145)
Sodium: 138 mmol/L (ref 135–145)
Sodium: 138 mmol/L (ref 135–145)
TCO2: 34 mmol/L — ABNORMAL HIGH (ref 22–32)
TCO2: 35 mmol/L — ABNORMAL HIGH (ref 22–32)
TCO2: 36 mmol/L — ABNORMAL HIGH (ref 22–32)
TCO2: 36 mmol/L — ABNORMAL HIGH (ref 22–32)

## 2019-04-23 LAB — CULTURE, RESPIRATORY W GRAM STAIN: Special Requests: NORMAL

## 2019-04-23 LAB — CBC
HCT: 24 % — ABNORMAL LOW (ref 39.0–52.0)
HCT: 27.2 % — ABNORMAL LOW (ref 39.0–52.0)
Hemoglobin: 7.1 g/dL — ABNORMAL LOW (ref 13.0–17.0)
Hemoglobin: 8.6 g/dL — ABNORMAL LOW (ref 13.0–17.0)
MCH: 28.9 pg (ref 26.0–34.0)
MCH: 30 pg (ref 26.0–34.0)
MCHC: 29.6 g/dL — ABNORMAL LOW (ref 30.0–36.0)
MCHC: 31.6 g/dL (ref 30.0–36.0)
MCV: 97.6 fL (ref 80.0–100.0)
MCV: 94.8 fL (ref 80.0–100.0)
Platelets: 219 10*3/uL (ref 150–400)
Platelets: 204 10*3/uL (ref 150–400)
RBC: 2.46 MIL/uL — ABNORMAL LOW (ref 4.22–5.81)
RBC: 2.87 MIL/uL — ABNORMAL LOW (ref 4.22–5.81)
RDW: 16.2 % — ABNORMAL HIGH (ref 11.5–15.5)
RDW: 16.4 % — ABNORMAL HIGH (ref 11.5–15.5)
WBC: 10.5 10*3/uL (ref 4.0–10.5)
WBC: 9.9 10*3/uL (ref 4.0–10.5)
nRBC: 0 % (ref 0.0–0.2)
nRBC: 0 % (ref 0.0–0.2)

## 2019-04-23 LAB — MAGNESIUM: Magnesium: 1.9 mg/dL (ref 1.7–2.4)

## 2019-04-23 LAB — PHOSPHORUS: Phosphorus: 2.1 mg/dL — ABNORMAL LOW (ref 2.5–4.6)

## 2019-04-23 LAB — PREPARE RBC (CROSSMATCH)

## 2019-04-23 SURGERY — INSERTION, PLEURAL DRAINAGE CATHETER
Anesthesia: Monitor Anesthesia Care | Site: Chest | Laterality: Left

## 2019-04-23 MED ORDER — CEFAZOLIN SODIUM-DEXTROSE 2-4 GM/100ML-% IV SOLN
INTRAVENOUS | Status: AC
Start: 2019-04-23 — End: 2019-04-23
  Administered 2019-04-23: 2 g via INTRAVENOUS
  Filled 2019-04-23: qty 100

## 2019-04-23 MED ORDER — DEXMEDETOMIDINE HCL IN NACL 200 MCG/50ML IV SOLN
INTRAVENOUS | Status: DC | PRN
  Administered 2019-04-23 (×3): 4 ug via INTRAVENOUS

## 2019-04-23 MED ORDER — INSULIN GLARGINE 100 UNIT/ML ~~LOC~~ SOLN
36.0000 [IU] | Freq: Every day | SUBCUTANEOUS | Status: DC
  Administered 2019-04-24 – 2019-05-09 (×14): 36 [IU] via SUBCUTANEOUS
  Filled 2019-04-23 (×19): qty 0.36

## 2019-04-23 MED ORDER — DEXTROSE 50 % IV SOLN
INTRAVENOUS | Status: AC
Start: 2019-04-23 — End: 2019-04-23
  Administered 2019-04-23: 50 mL
  Filled 2019-04-23: qty 50

## 2019-04-23 MED ORDER — ONDANSETRON HCL 4 MG/2ML IJ SOLN
INTRAMUSCULAR | Status: DC | PRN
  Administered 2019-04-23: 4 mg via INTRAVENOUS

## 2019-04-23 MED ORDER — GELATIN ABSORBABLE 12-7 MM EX MISC
CUTANEOUS | Status: AC | PRN
  Administered 2019-04-23: 1 via TOPICAL

## 2019-04-23 MED ORDER — CHLORHEXIDINE GLUCONATE 4 % EX LIQD
CUTANEOUS | Status: AC | PRN
  Administered 2019-04-23: 1 via TOPICAL

## 2019-04-23 MED ORDER — GELATIN ABSORBABLE 12-7 MM EX MISC
CUTANEOUS | Status: AC
  Filled 2019-04-23: qty 1

## 2019-04-23 MED ORDER — MIDAZOLAM HCL 2 MG/2ML IJ SOLN
INTRAMUSCULAR | Status: AC
  Filled 2019-04-23: qty 2

## 2019-04-23 MED ORDER — LIDOCAINE HCL 1 % IJ SOLN
INTRAMUSCULAR | Status: DC | PRN
  Administered 2019-04-23: 15 mL

## 2019-04-23 MED ORDER — MIDAZOLAM HCL 2 MG/2ML IJ SOLN
INTRAMUSCULAR | Status: AC | PRN
  Administered 2019-04-23: 1 mg via INTRAVENOUS

## 2019-04-23 MED ORDER — DEXTROSE 50 % IV SOLN
INTRAVENOUS | Status: AC
  Administered 2019-04-23: 25 mL via INTRAVENOUS
  Filled 2019-04-23: qty 50

## 2019-04-23 MED ORDER — LIDOCAINE HCL 1 % IJ SOLN
INTRAMUSCULAR | Status: AC
  Filled 2019-04-23: qty 20

## 2019-04-23 MED ORDER — FENTANYL CITRATE (PF) 250 MCG/5ML IJ SOLN
INTRAMUSCULAR | Status: AC
  Filled 2019-04-23: qty 5

## 2019-04-23 MED ORDER — FENTANYL CITRATE (PF) 250 MCG/5ML IJ SOLN
INTRAMUSCULAR | Status: DC | PRN
  Administered 2019-04-23: 25 ug via INTRAVENOUS

## 2019-04-23 MED ORDER — FENTANYL CITRATE (PF) 100 MCG/2ML IJ SOLN
INTRAMUSCULAR | Status: AC | PRN
  Administered 2019-04-23: 50 ug via INTRAVENOUS

## 2019-04-23 MED ORDER — HEPARIN SODIUM (PORCINE) 1000 UNIT/ML IJ SOLN
INTRAMUSCULAR | Status: AC
  Filled 2019-04-23: qty 1

## 2019-04-23 MED ORDER — MIDAZOLAM HCL 2 MG/2ML IJ SOLN
INTRAMUSCULAR | Status: AC
  Filled 2019-04-23: qty 4

## 2019-04-23 MED ORDER — LIDOCAINE HCL 1 % IJ SOLN
INTRAMUSCULAR | Status: AC | PRN
  Administered 2019-04-23: 10 mL

## 2019-04-23 MED ORDER — FENTANYL CITRATE (PF) 100 MCG/2ML IJ SOLN
INTRAMUSCULAR | Status: AC
  Filled 2019-04-23: qty 4

## 2019-04-23 MED ORDER — 0.9 % SODIUM CHLORIDE (POUR BTL) OPTIME
TOPICAL | Status: DC | PRN
  Administered 2019-04-23: 1000 mL

## 2019-04-23 MED ORDER — DEXTROSE 50 % IV SOLN: 25.0000 mL | Freq: Once | INTRAVENOUS | Status: AC

## 2019-04-23 MED ORDER — CHLORHEXIDINE GLUCONATE 4 % EX LIQD
CUTANEOUS | Status: AC
  Filled 2019-04-23: qty 15

## 2019-04-23 MED ORDER — HEPARIN SODIUM (PORCINE) 1000 UNIT/ML IJ SOLN
INTRAMUSCULAR | Status: AC | PRN
  Administered 2019-04-23: 3000 [IU] via INTRAVENOUS

## 2019-04-23 SURGICAL SUPPLY — 31 items
BLADE CLIPPER SURG (BLADE) ×3 IMPLANT
CANISTER SUCT 3000ML PPV (MISCELLANEOUS) ×3 IMPLANT
CNTNR URN SCR LID CUP LEK RST (MISCELLANEOUS) ×1 IMPLANT
CONT SPEC 4OZ STRL OR WHT (MISCELLANEOUS) ×2
COVER SURGICAL LIGHT HANDLE (MISCELLANEOUS) ×3 IMPLANT
DERMABOND ADVANCED (GAUZE/BANDAGES/DRESSINGS) ×2
DERMABOND ADVANCED .7 DNX12 (GAUZE/BANDAGES/DRESSINGS) ×1 IMPLANT
DRAPE C-ARM 42X72 X-RAY (DRAPES) ×3 IMPLANT
DRAPE LAPAROSCOPIC ABDOMINAL (DRAPES) ×3 IMPLANT
ELECT REM PT RETURN 9FT ADLT (ELECTROSURGICAL) ×3
ELECTRODE REM PT RTRN 9FT ADLT (ELECTROSURGICAL) ×1 IMPLANT
GLOVE BIO SURGEON STRL SZ 6.5 (GLOVE) ×2 IMPLANT
GLOVE BIO SURGEON STRL SZ7.5 (GLOVE) ×6 IMPLANT
GLOVE BIO SURGEONS STRL SZ 6.5 (GLOVE) ×1
GLOVE BIOGEL PI IND STRL 6.5 (GLOVE) ×1 IMPLANT
GLOVE BIOGEL PI INDICATOR 6.5 (GLOVE) ×2
GOWN STRL REUS W/ TWL LRG LVL3 (GOWN DISPOSABLE) ×2 IMPLANT
GOWN STRL REUS W/TWL LRG LVL3 (GOWN DISPOSABLE) ×4
KIT BASIN OR (CUSTOM PROCEDURE TRAY) ×3 IMPLANT
KIT PLEURX DRAIN CATH 1000ML (MISCELLANEOUS) ×6 IMPLANT
KIT PLEURX DRAIN CATH 15.5FR (DRAIN) ×3 IMPLANT
KIT TURNOVER KIT B (KITS) ×3 IMPLANT
NS IRRIG 1000ML POUR BTL (IV SOLUTION) ×3 IMPLANT
PACK GENERAL/GYN (CUSTOM PROCEDURE TRAY) ×3 IMPLANT
PAD ARMBOARD 7.5X6 YLW CONV (MISCELLANEOUS) ×6 IMPLANT
SUT ETHILON 3 0 FSL (SUTURE) ×3 IMPLANT
SUT ETHILON 3 0 PS 1 (SUTURE) ×3 IMPLANT
SUT VIC AB 3-0 SH 8-18 (SUTURE) ×3 IMPLANT
SYR CONTROL 10ML LL (SYRINGE) ×3 IMPLANT
TOWEL GREEN STERILE FF (TOWEL DISPOSABLE) ×3 IMPLANT
WATER STERILE IRR 1000ML POUR (IV SOLUTION) ×3 IMPLANT

## 2019-04-23 NOTE — Progress Notes (Signed)
Patient transported to interventional radiology with help from transport staff. Patient on telemetry and trach collar (venti mask 8L), 3 mcg levo, 30 mg amiodarone. VSS. IR RN received patient.

## 2019-04-23 NOTE — Progress Notes (Addendum)
TCTS DAILY ICU PROGRESS NOTE                   [address]            [place] [address]          [phone]   13 Days Post-Op Procedure(s) (LRB): REMOVAL OF IMPELLA LEFT VENTRICULAR ASSIST DEVICE WITH INSERTION OF RIGHT FEMORAL ARTERIAL LINE (N/A)  Total Length of Stay:  LOS: 40 days   Subjective:  for tunneled dialysis cath today Feels ok  Objective: Vital signs in last 24 hours: Temp:  [97.5 F (36.4 C)-98.4 F (36.9 C)] 98.2 F (36.8 C) (03/08 0400) Pulse Rate:  [72-88] 76 (03/08 0800) Cardiac Rhythm: Normal sinus rhythm (03/08 0800) Resp:  [12-31] 13 (03/08 0800) BP: (56-141)/(31-119) 108/31 (03/08 0800) SpO2:  [90 %-100 %] 93 % (03/08 0800) FiO2 (%):  [35 %-40 %] 35 % (03/08 0800) Weight:  [83.2 kg] 83.2 kg (03/08 0700)  Filed Weights   04/20/19 0500 04/22/19 0600 04/23/19 0700  Weight: 87 kg 85.7 kg 83.2 kg    Weight change: -2.5 kg   Hemodynamic parameters for last 24 hours: CVP:  [2 mmHg-24 mmHg] 10 mmHg  Intake/Output from previous day: 03/07 0701 - 03/08 0700 In: 12076.8 [P.O.:265; I.V.:1431.8; NG/GT:1380] Out: 35009 [Urine:6050]  Intake/Output this shift: Total I/O In: 19.5 [I.V.:19.5] Out: -   Current Meds: Scheduled Meds: . aspirin  81 mg Oral Daily  . atorvastatin  80 mg Oral q1800  . chlorhexidine gluconate (MEDLINE KIT)  15 mL Mouth Rinse BID  . Chlorhexidine Gluconate Cloth  6 each Topical Q0600  . Chlorhexidine Gluconate Cloth  6 each Topical Q0600  . ciprofloxacin  500 mg Oral BID  . darbepoetin (ARANESP) injection - NON-DIALYSIS  60 mcg Subcutaneous Q Tue-1800  . feeding supplement (NEPRO CARB STEADY)  237 mL Oral TID BM  . feeding supplement (PRO-STAT SUGAR FREE 64)  60 mL Per Tube BID  . Gerhardt's butt cream   Topical BID  . hydrocortisone   Rectal BID  . insulin aspart  0-24 Units Subcutaneous Q4H  . insulin glargine  36 Units Subcutaneous Daily  . mouth rinse  15 mL Mouth Rinse 10 times per day  .  metoCLOPramide (REGLAN) injection  5 mg Intravenous Q8H  . midodrine  15 mg Oral TID WC  . multivitamin  1 tablet Oral QHS  . pantoprazole (PROTONIX) IV  40 mg Intravenous Q24H  . simethicone  80 mg Oral BID PC  . sodium chloride flush  10-40 mL Intracatheter Q12H   Continuous Infusions: . sodium chloride    . sodium chloride    . sodium chloride    . amiodarone 30 mg/hr (04/23/19 0800)  . calcium gluconate infusion for CRRT Stopped (04/23/19 0700)  .  ceFAZolin (ANCEF) IV    . feeding supplement (OSMOLITE 1.2 CAL) Stopped (04/23/19 0100)  . magnesium sulfate bolus IVPB    . norepinephrine (LEVOPHED) Adult infusion 3 mcg/min (04/23/19 0800)  . prismasol B22GK 4/0 300 mL/hr at 04/22/19 2204  . prismasol B22GK 4/0 1,500 mL/hr at 04/23/19 0500  . sodium citrate 2 %/dextrose 2.5% solution 3000 mL 300 mL/hr at 04/23/19 0221   PRN Meds:.sodium chloride, sodium chloride, sodium chloride, oxyCODONE **AND** acetaminophen, dextrose, fentaNYL (SUBLIMAZE) injection, heparin, hyoscyamine, levalbuterol, lidocaine (PF), lidocaine-prilocaine, loperamide HCl, ondansetron (ZOFRAN) IV, pentafluoroprop-tetrafluoroeth, promethazine, Resource ThickenUp Clear, sodium chloride, sodium chloride flush, sodium chloride flush, sodium chloride flush  General appearance: alert, cooperative  and no distress Heart: regular rate and rhythm Lungs: dim L>R lower fields Abdomen: soft, non-tender Extremities: minor edema Wound: incis healing well  Lab Results: CBC: Recent Labs    04/22/19 1612 04/22/19 2027 04/23/19 0442 04/23/19 0449  WBC 8.9  --  10.5  --   HGB 7.0*   < > 7.1* 7.5*  HCT 23.2*   < > 24.0* 22.0*  PLT 191  --  219  --    < > = values in this interval not displayed.   BMET:  Recent Labs    04/22/19 1612 04/22/19 2027 04/23/19 0442 04/23/19 0449  NA 136   < > 136 132*  K 4.2   < > 4.2 3.9  CL 91*   < > 90* 86*  CO2 35*  --  33*  --   GLUCOSE 172*   < > 193* 209*  BUN 26*   < > 26* 25*   CREATININE 1.48*   < > 1.50* 1.40*  CALCIUM 9.4  --  10.1  --    < > = values in this interval not displayed.    CMET: Lab Results  Component Value Date   WBC 10.5 04/23/2019   HGB 7.5 (L) 04/23/2019   HCT 22.0 (L) 04/23/2019   PLT 219 04/23/2019   GLUCOSE 209 (H) 04/23/2019   CHOL 117 03/16/2019   TRIG 52 03/16/2019   HDL 33 (L) 03/16/2019   LDLCALC 74 03/16/2019   ALT 11 04/23/2019   AST 23 04/23/2019   NA 132 (L) 04/23/2019   K 3.9 04/23/2019   CL 86 (L) 04/23/2019   CREATININE 1.40 (H) 04/23/2019   BUN 25 (H) 04/23/2019   CO2 33 (H) 04/23/2019   TSH 3.683 03/13/2019   INR 1.7 (H) 03/27/2019   HGBA1C 6.1 (H) 03/13/2019      PT/INR: No results for input(s): LABPROT, INR in the last 72 hours. Radiology: No results found.   Assessment/Plan: S/P Procedure(s) (LRB): REMOVAL OF IMPELLA LEFT VENTRICULAR ASSIST DEVICE WITH INSERTION OF RIGHT FEMORAL ARTERIAL LINE (N/A)  1 stable on current management, for tunneled cath today 2 nephrology management as per consult 3 On TC -pilm management  as per PCCM 4 low dose levo and IV amio- current in sinus rhythm. AHF team assisting with management 5 urine pink tinged- cont CBI 6 H/H 7.5/22- may need to consider transfusion soon 7 BS fair control 8 push PT as able 9 TF's for nutrition John Giovanni PA-C 04/23/2019 8:07 AM   Will place L Pleurx cath today for recurrent symptomatic postop effusion 1 U PRBC for Hb 7.5- urine is bloody again- on CBI  patient examined and medical record reviewed,agree with above note. Tharon Aquas Trigt III 04/23/2019

## 2019-04-23 NOTE — Brief Op Note (Signed)
04/23/2019  12:28 PM  PATIENT:  Jesse Murphy  86 y.o. male  PRE-OPERATIVE DIAGNOSIS:  LEFT PLEURAL EFFUSION  POST-OPERATIVE DIAGNOSIS:  LEFT PLEURAL EFFUSION  PROCEDURE:  Procedure(s): INSERTION PLEURAL DRAINAGE CATHETER TO DRAIN LEFT PLEURAL EFFUSION (Left)  Drainage 1 L left effusion  SURGEON:  Surgeon(s) and Role:    Ivin Poot, MD - Primary  PHYSICIAN ASSISTANT:   ASSISTANTS: none   ANESTHESIA:   MAC  EBL:  0 mL   BLOOD ADMINISTERED:none  DRAINS: Left Pleurx catheter   LOCAL MEDICATIONS USED:  LIDOCAINE   SPECIMEN:  No Specimen  DISPOSITION OF SPECIMEN:  microbiology  COUNTS:  YES  TOURNIQUET:  * No tourniquets in log *  DICTATION: .Dragon Dictation  PLAN OF CARE: retutrn to 2 H  PATIENT DISPOSITION:  ICU   Delay start of Pharmacological VTE agent (>24hrs) due to surgical blood loss or risk of bleeding: yes

## 2019-04-23 NOTE — Progress Notes (Signed)
  Hypoglycemic Event  CBG: 26  Treatment: D50 50 mL (25 gm)  Symptoms: Pale  Follow-up CBG: Time: 0811  CBG Result: 116  Possible Reasons for Event: Inadequate meal intake; tube feeds paused for procedure      Cherlyn Cushing BSN RN

## 2019-04-23 NOTE — Progress Notes (Signed)
Patient transported to short stay on tele. RN to receive patient.

## 2019-04-23 NOTE — Progress Notes (Signed)
PT Cancellation Note  Patient Details Name: Jesse Murphy MRN: [id number] DOB: Aug 27, 1928   Cancelled Treatment:    Reason Eval/Treat Not Completed: Patient at procedure or test/unavailable. Pt off floor for tunneled HD catheter placement. PT to re-attempt as time allows.   Jesse Murphy 04/23/2019, 10:15 AM   Lorrin Goodell, PT  Office # [phone] Pager [phone]

## 2019-04-23 NOTE — Progress Notes (Signed)
SLP Cancellation Note  Patient Details Name: Jesse Murphy MRN: [id number] DOB: 1928-05-09   Cancelled treatment:  Pt off floor for tunneled HD catheter placement. SLP to re-attempt as time allows.                                                                                                     Marysue Fait L. Tivis Ringer, [place] CCC/SLP Acute Rehabilitation Services Office number [phone] Pager [phone]   Juan Quam Laurice 04/23/2019, 11:12 AM

## 2019-04-23 NOTE — Transfer of Care (Signed)
Immediate Anesthesia Transfer of Care Note  Patient: Jesse Murphy  Procedure(s) Performed: INSERTION PLEURAL DRAINAGE CATHETER TO DRAIN LEFT PLEURAL EFFUSION (Left Chest)  Patient Location: ICU  Anesthesia Type:MAC  Level of Consciousness: drowsy and patient cooperative  Airway & Oxygen Therapy: Patient Spontanous Breathing and Patient connected to T-piece oxygen  Post-op Assessment: Report given to RN, Post -op Vital signs reviewed and stable and Patient moving all extremities X 4  Post vital signs: Reviewed and stable  Last Vitals:  Vitals Value Taken Time  BP 105/37 04/23/19 1238  Temp    Pulse 76 04/23/19 1244  Resp 16 04/23/19 1244  SpO2 92 % 04/23/19 1244  Vitals shown include unvalidated device data.  Last Pain:  Vitals:   04/23/19 0826  TempSrc:   PainSc: 0-No pain      Patients Stated Pain Goal: 3 (94/49/67 5916)  Complications: No apparent anesthesia complications

## 2019-04-23 NOTE — Plan of Care (Signed)
  Problem: Nutrition: Goal: Adequate nutrition will be maintained Outcome: Not Progressing   Problem: Pain Managment: Goal: General experience of comfort will improve Outcome: Not Progressing   Problem: Cardiac: Goal: Ability to achieve and maintain adequate cardiopulmonary perfusion will improve Outcome: Progressing   Problem: Education: Goal: Knowledge about tracheostomy care/management will improve Outcome: Progressing   Problem: Respiratory: Goal: Patent airway maintenance will improve Outcome: Progressing

## 2019-04-23 NOTE — Op Note (Signed)
NAME: Jesse Murphy, Jesse Murphy. MEDICAL RECORD [id number] ACCOUNT [id number] DATE OF BIRTH:[birth year] FACILITY: [facility] LOCATION: [facility] PHYSICIAN:Shaquina Gillham VAN TRIGT III, MD  OPERATIVE REPORT  DATE OF PROCEDURE:  04/23/2019  OPERATION:  Placement of left PleurX catheter and drainage of left pleural effusion (900 mL).  SURGEON:  Ivin Poot, MD  ANESTHESIA:  Local 1% lidocaine with IV conscious monitored sedation by anesthesia.  PREOPERATIVE DIAGNOSIS:  Status post urgent coronary artery bypass graft for ischemic cardiomyopathy, severe coronary artery disease, recurrent left pleural effusion.  POSTOPERATIVE DIAGNOSIS:  Status post urgent coronary artery bypass graft for ischemic cardiomyopathy, severe coronary artery disease, recurrent left pleural effusion.  DESCRIPTION OF PROCEDURE:  The patient was brought from preop holding where informed consent was documented and the proper site was marked.  The patient and the patient's son had been previously informed about the details of the procedure including the  expected benefits, alternatives and risks.  The patient was placed supine on the operating table and a roll was placed under the left side to bump up the left chest.  The left arm was tucked at the side.  The left chest was prepped and draped as a sterile field.  The patient was breathing  spontaneously.    A proper time-out was performed.  Lidocaine 1% was infiltrated in 2 areas, the higher area was at the anterior axillary line at the 5th interspace and the lower area was at the left costal margin at the midclavicular line.  Next, 2 small incisions were  made in these areas of anesthesia.  Through the upper incision, using the Seldinger technique, a guidewire, then sheath was placed into the left pleural space and documented by C-arm fluoroscopy.  Next, the PleurX catheter was tunneled from the lower incision to the upper incision.  Next, the guidewire was used to dilate a tract into the  pleural space and then the dilator sheath system was inserted with drainage of fluid.  The fluid was sent for culture.  Through the tear-away sheath, the PleurX catheter was then inserted and position confirmed by C-arm fluoroscopy.  The sheath was totally removed.  The catheter was then drained of 900 mL of serosanguineous fluid.  The upper incision was closed in layers using interrupted 3-0 Vicryl for subcutaneous layer and 3-0 nylon for the skin.  The lower incision was closed with a silk suture, which was used  to also secure the catheter.  The catheter was capped and covered in a sterile dressing.  The patient tolerated the procedure well.  A chest x-ray taken in the operating room showed the catheter in good position with good drainage of pleural effusion and no evidence of pneumothorax noted.  VN/NUANCE  D:04/23/2019 T:04/23/2019 JOB:[id number]

## 2019-04-23 NOTE — Procedures (Addendum)
Interventional Radiology Procedure Note  Procedure:  Image guided left IJ tunneled HD catheter.  23cm tip to cuff..  Complications: None Recommendations:  - Ok to use - Do not submerge - Routine line care  - ok to restart any blood thinners as needed  Signed,  Dulcy Fanny. Earleen Newport, DO

## 2019-04-23 NOTE — Progress Notes (Signed)
[facility] Progress Note    Assessment/ Plan:   1. AKI(Cr was 0.7 prior to surgery)- due to postoperative CHF/cardiogenic shock that was refractory to diuretics and started on CRRT on 03/29/19 - 2/23 - discussion with advanced CHF. CRRT restarted 2/24 then off after clotting. Attempted IHD on 2/26 with minimal UF and note increased pressors and rapid afib 1. CRRT on 3/1-3/4; on citrate. 2. Poor response to Lasix challenge ->  CRRT 3/5- 3/8 at 0645. Had been onToleratingnet UF -CVP currently 6-9 + Levo79mgwhich is being titrated  +  Midodrine. 3. Off CRRT for Jesse tunneled catheter today. Will not look to resume CRRT right away, will likely give him til the AM and see what things look like 2. Acute systolic CHF/cardiogenic shock- EF 25-30%; s/p Impella- now out. optimize volume as above. On midodrine  15 mg TID. 3. CAD s/p [id number] by cardiogenic shock and ARF.Postop echo EF 25-30% 4. Acute hypoxic respiratory failure with pseudomonas pneumonia- s/p trach on 03/27/19.s/pabx per PCCM-> completed abx 5. PAF- s/p MAZE and LAA occlusion- therapy per CHF team 6. Hematuria- bladder irrigationper urology recommendations- still pretty dark. 7. ABLA- Hx Melena. Transfuse as needed. Have given RRBC's on 2/22 and 2/23. And 2/25. Gave aranesp 2/23 40 mcg andthen60 mcg on3/2  8. Protein malnutrition- moderate to severe 9. Secondary hyperparathyroidism/bone mineral - stopped therenvela.  10. Disposition-  he is Jesse poor longterm dialysis candidate given his cardiogenic shock and trach.  Subjective:   Off CRRT since early this AM for tunneled cath Levo347m Unable to tell UOP due to CBI   Objective:   BP (!) 134/119   Pulse 80   Temp 98.2 F (36.8 C) (Oral)   Resp 16   Ht '5\' 8"'  (1.727 m)   Wt 85.7 kg   SpO2 96%   BMI 28.73 kg/m   Intake/Output Summary (Last 24 hours) at 04/23/2019 [id number] data filed at 04/23/2019 0700 Gross per 24  hour  Intake 12076.8 ml  Output 10753 ml  Net 1323.8 ml   Weight change:   Physical Exam: Gen: elderly male in bed chronically ill-appearing- alert, responsive HEENT trach in place; on vent  CVS: S1 S2 no rub Resp:clear;unlabored at rest; trach collar Abd: soft/ NT/ND  Ext:1-2+edema, in thighs as well Neuro - awake and interactive and follows commands, nods GU foley in place withirrigation, pretty dark but no clots noted Access: is due to get tunneled HD cath today   Imaging: DG Chest Port 1 View  Result Date: 04/22/2019 CLINICAL DATA:  [age]ear old male status post CABG on 03/16/2019. EXAM: PORTABLE CHEST 1 VIEW COMPARISON:  Chest x-ray 04/21/2019. FINDINGS: Jesse tracheostomy tube is in place with tip 8.2 cm above the carina. Jesse feeding tube is seen extending into the abdomen, however, the tip of the feeding tube extends below the lower margin of the image. Left subclavian Vas-Cath with tip terminating in the distal superior vena cava. There is Jesse right upper extremity PICC with tip terminating in the superior cavoatrial junction. Bibasilar opacities (left greater than right) which may reflect areas of atelectasis and/or consolidation. Moderate left and small right pleural effusions. No evidence of pulmonary edema. Heart size is normal. Upper mediastinal contours are within normal limits. Status post median sternotomy for CABG. Left atrial appendage ligation clip. Surgical clips project over the right subclavian region. IMPRESSION: 1. Support apparatus and postoperative changes, as above. 2. Moderate left and small right pleural effusions with probable basilar atelectasis bilaterally. 3.  Aortic atherosclerosis. Electronically Signed   By: Vinnie Langton M.D.   On: 04/22/2019 07:50    Labs: [employer] Recent Labs  Lab 04/20/19 0530 04/20/19 0530 04/20/19 1652 04/20/19 2102 04/21/19 0313 04/21/19 0328 04/21/19 1652 04/21/19 1709 04/22/19 0500 04/22/19 0501 04/22/19 1612 04/22/19 1612  04/22/19 2027 04/22/19 2036 04/23/19 0016 04/23/19 0021 04/23/19 0441 04/23/19 0442 04/23/19 0449  NA 138   < > 135   < > 133*   < > 135   < > 139   < > 136   < > 137 132* 138 133* 138 136 132*  K 4.0   < > 4.4   < > 3.9   < > 4.3   < > 4.0   < > 4.2   < > 4.1 4.2 4.1 4.1 4.0 4.2 3.9  CL 85*   < > 83*   < > 87*   < > 90*   < > 92*   < > 91*   < > 88* 86* 88* 87* 89* 90* 86*  CO2 41*  --  39*  --  34*  --  33*  --  36*  --  35*  --   --   --   --   --   --  33*  --   GLUCOSE 153*   < > 155*   < > 238*   < > 264*   < > 132*   < > 172*   < > 181* 212* 145* 176* 144* 193* 209*  BUN 45*   < > 63*   < > 48*   < > 36*   < > 29*   < > 26*   < > 23 26* 24* 27* 22 26* 25*  CREATININE 2.46*   < > 3.14*   < > 2.39*   < > 1.95*   < > 1.61*   < > 1.48*   < > 1.30* 1.60* 1.30* 1.50* 1.20 1.50* 1.40*  CALCIUM 8.6*  --  8.3*  --  11.3*  --  10.0  --  7.0*  --  9.4  --   --   --   --   --   --  10.1  --   PHOS 1.8*  --  2.2*  --  1.6*  --  1.3*  --  1.4*  --  1.6*  --   --   --   --   --   --  2.1*  --    < > = values in this interval not displayed.   CBC Recent Labs  Lab 04/19/19 0401 04/19/19 0402 04/21/19 0250 04/21/19 0328 04/22/19 0500 04/22/19 0501 04/22/19 1612 04/22/19 2027 04/23/19 0021 04/23/19 0441 04/23/19 0442 04/23/19 0449  WBC 11.8*   < > 8.0  --  10.1  --  8.9  --   --   --  10.5  --   NEUTROABS 9.2*  --   --   --   --   --   --   --   --   --   --   --   HGB 8.5*   < > 8.4*   < > 7.3*   < > 7.0*   < > 7.8* 8.5* 7.1* 7.5*  HCT 28.1*   < > 26.3*   < > 24.2*   < > 23.2*   < > 23.0* 25.0* 24.0* 22.0*  MCV 96.6   < > 96.0  --  97.6  --  97.1  --   --   --  97.6  --   PLT 255   < > 196  --  226  --  191  --   --   --  219  --    < > = values in this interval not displayed.    Medications:    . aspirin  81 mg Oral Daily  . atorvastatin  80 mg Oral q1800  . chlorhexidine gluconate (MEDLINE KIT)  15 mL Mouth Rinse BID  . Chlorhexidine Gluconate Cloth  6 each Topical Q0600  .  Chlorhexidine Gluconate Cloth  6 each Topical Q0600  . ciprofloxacin  500 mg Oral BID  . darbepoetin (ARANESP) injection - NON-DIALYSIS  60 mcg Subcutaneous Q Tue-1800  . feeding supplement (NEPRO CARB STEADY)  237 mL Oral TID BM  . feeding supplement (PRO-STAT SUGAR FREE 64)  60 mL Per Tube BID  . Gerhardt's butt cream   Topical BID  . hydrocortisone   Rectal BID  . insulin aspart  0-24 Units Subcutaneous Q4H  . insulin glargine  36 Units Subcutaneous Daily  . mouth rinse  15 mL Mouth Rinse 10 times per day  . metoCLOPramide (REGLAN) injection  5 mg Intravenous Q8H  . midodrine  15 mg Oral TID WC  . multivitamin  1 tablet Oral QHS  . pantoprazole (PROTONIX) IV  40 mg Intravenous Q24H  . simethicone  80 mg Oral BID PC  . sodium chloride flush  10-40 mL Intracatheter Q12H      Jesse Murphy Jesse Murphy  04/23/2019, 7:14 AM

## 2019-04-23 NOTE — Progress Notes (Signed)
NAME:  Jesse Murphy, MRN:  [id number], DOB:  [birth year], LOS: 21 ADMISSION DATE:  03/13/2019, CONSULTATION DATE:  2/2 REFERRING MD:  Nils Pyle, CHIEF COMPLAINT:  Dyspnea   Brief History   86 y/o male admitted 1/26, underwent CABG and L atrial appendage clipping on 1/29, moved to ICU on 2/1 for intubation.  Past Medical History  A fib, CAD, Prostate cancer, HTN  Significant Hospital Events   1/29 CABG 2/01 to ICU 2/05 Extubated 2/08 Reintubated 2/09 tracheostomy 2/09 Impella, PA catheter placed 2/11 CRRT initiated 2/16 leukocytosis, add vancomycin 2/17 start TC trials 2/19 urology placed 3 way foley, bleeding at rectal tube site 2/21 ATC initiated  2/22 1 unit PRBC 2/23 1 unit PRBC. Impella removed 2/25 ATC trach collar. 3/01 Converted to A fib in HD 3/01 Started CVVHD, net negative 50 cc per hour, on Levo at 14 3/04 CVVH d/c'd 3/05 Down Sized to #4 Cuff less trach; CRRT resumed 3/08 To OR for pleural drainage cath placement, to IR for perm cath placement   Consults:  Nephrology CHF team Urology  Procedures:  Rt PICC 2/02 >> Trach 2/09 >> PA catheter 2/09 > 2/12  Lt [place] HD cath 2/11 >> 3/8 L Rad Aline 2/3 > 2/20  Impella 2/10 > 2/23 L Perm Cath 3/8 >>  L PleurX 3/8 >>  Significant Diagnostic Tests:  Echo 1/26 > EF 30 to 35% Echo 2/09 > EF 50-55%  Micro Data:  SARS CoV2 PCR 1/26 > negative Influenza PCR 1/26 > negative Sputum 2/01 > oral flora Sputum 2/08 > Pseudomonas >> pan sensitive  Blood 2/16 > negative Sputum 2/16 > Pseudomonas sens to cipro, gent, imepenem Resp 2/20 > PA sens cipro, gent, impipenem BCx2 2/20 > neg Tracheal aspirate 3/5 >> pseudomonas >> R-ceftazidime, imipenem. S-cipro, gentamicin  Left Pleural Fluid 3/8 >>   Antimicrobials:  Rocephin 2/01 > 2/07 Cefepime 2/08 > 2/10 Fortaz 2/10 > 2/16 Vancomycin 2/16 > 2/19 Meropenem 2/20 > 2/26 Cipro 3/7 >>  Interim history/subjective:  Pt returned from OR post pleural drainage catheter  placement.  Also had perm cath placed. PRBC infusing.  Afebrile / WBC 10.5 Hgb 7.1  Continuous bladder irrigation infusing  Objective   Blood pressure (!) 99/36, pulse 79, temperature 98.3 F (36.8 C), temperature source Axillary, resp. rate (!) 24, height 5\' 8"  (1.727 m), weight 83.2 kg, SpO2 90 %. CVP:  [2 mmHg-24 mmHg] 16 mmHg  FiO2 (%):  [35 %-40 %] 35 %   Intake/Output Summary (Last 24 hours) at 04/23/2019 1550 Last data filed at 04/23/2019 1244 Gross per 24 hour  Intake 10831.61 ml  Output 9749 ml  Net 1082.61 ml   Filed Weights   04/20/19 0500 04/22/19 0600 04/23/19 0700  Weight: 87 kg 85.7 kg 83.2 kg   Physical Exam: General: ill appearing male lying in bed in NAD HEENT: MM pink/moist, #4 cuff-less trach midline c/d/i Neuro: sleeping, no distress CV: s1s2 regular, SR 70's on monitor, wide QRS, no m/r/g, staples to right chest wall PULM:  Non-labored on ATC, 35%, coarse breath sounds bilaterally  GI: soft, bsx4 active GU: continuous bladder irrigation in place  Extremities: warm/dry, trace edema  Skin: thin skin, scattered bruising. New left perm cath  Resolved Hospital Problem list   Pseudomonas HCAP s/p completion meropenem Acute Hypoxemic Respiratory Failure due to acute pulmonary edema, pneumonia. 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 Colonization.  S/P tracheostomy 2/9. Downsized to #4 cuff less on 3/4.   -continue ATC as tolerated  -wean FiO2 for sats >90% -mobilize, PT efforts  -continue SLP efforts  -left pleurx catheter care  -continue Cipro, D2/5  -aspiration precautions, sit upright for eating  Cardiogenic Shock s/p CABG, L atrial appendage clipping with Acute Systolic Heart  Failure. Atrial fibrillation s/p MAZE and LAA occlusion  S/p Impella removal 2/23.  EF 50-55%.  Adrenal insufficiency ruled out.  -per CHF, TCTS  -continue midodrine -continue amiodarone    Persistent Hematuria with retained clots  3/3 noted with gross hematuria  -Continuous bladder irrigation per Urology   AKI secondary to Cardiogenic Shock Off CVVHD due to planned perm cath placement 3/8 -Trend BMP / urinary output -Replace electrolytes as indicated -follow I/O's   Anemia of critical illness.  Some flexiseal trauma and hematuria as above -follow CBC -transfuse for Hgb <7%   DM2 with hyperglycemia  -SSI  -continue lantus 36 units QD  Pressure wounds / Reported as Skin Tear 2/15 -Continue wound care    Best practice:  Diet: Dysphagia 2 diet DVT prophylaxis: SCD's GI prophylaxis: PPI Mobility: bed rest Code Status: Partial Code  Disposition: ICU   PCCM will continue to follow for trach / pulmonary care.   Noe Gens, MSN, NP-C [facility] 04/23/2019, 3:51 PM   Please see [email or web address] for pager details.

## 2019-04-23 NOTE — Progress Notes (Signed)
Patient ID: Jesse Murphy, male   DOB: Sep 02, 1928, 86 y.o.   MRN: [id number]    Advanced Heart Failure Rounding Note  PCP-Cardiologist: Kate Sable, MD   Subjective:    Impella removed 2/23. Intraoperative TEE showed EF ~40%  Remained stable on trach collar with speaking valve in place.   Off CVVHD today for placement of tunneled catheter.  He is also scheduled for left Pleurx tube for left effusion.  Remains in sinus rhythm on IV amio.  Remains on trach collar.  No complaints.  He is back on CBI for recurrent hematuria.   Objective:   Weight Range: 83.2 kg Body mass index is 27.89 kg/m.   Vital Signs:   Temp:  [97.5 F (36.4 C)-98.4 F (36.9 C)] 98.2 F (36.8 C) (03/08 0400) Pulse Rate:  [72-88] 80 (03/08 0830) Resp:  [12-31] 18 (03/08 0830) BP: (56-141)/(31-119) 110/38 (03/08 0830) SpO2:  [90 %-100 %] 95 % (03/08 0830) FiO2 (%):  [35 %-40 %] 35 % (03/08 0800) Weight:  [83.2 kg] 83.2 kg (03/08 0700) Last BM Date: 04/22/19  Weight change: Filed Weights   04/20/19 0500 04/22/19 0600 04/23/19 0700  Weight: 87 kg 85.7 kg 83.2 kg    Intake/Output:   Intake/Output Summary (Last 24 hours) at 04/23/2019 0845 Last data filed at 04/23/2019 0800 Gross per 24 hour  Intake 11821.79 ml  Output 10508 ml  Net 1313.79 ml      Physical Exam   General:  Elderly male. Lying in bed on TC. No resp difficulty HEENT: normal Neck: supple. + trach collar  Carotids 2+ bilat; no bruits. No lymphadenopathy or thryomegaly appreciated. Cor: PMI nondisplaced. Regular rate & rhythm. No rubs, gallops or murmurs. + L [place] trialysis  Lungs: dull at bases Abdomen: soft, nontender, nondistended. No hepatosplenomegaly. No bruits or masses. Good bowel sounds. Extremities: no cyanosis, clubbing, rash, 1+ edema Neuro: awake alert. Following commands    Telemetry   NSR 80s personally reviewed   Labs    CBC Recent Labs    04/22/19 1612 04/22/19 2027 04/23/19 0442  04/23/19 0449  WBC 8.9  --  10.5  --   HGB 7.0*   < > 7.1* 7.5*  HCT 23.2*   < > 24.0* 22.0*  MCV 97.1  --  97.6  --   PLT 191  --  219  --    < > = values in this interval not displayed.   Basic Metabolic Panel Recent Labs    04/22/19 0500 04/22/19 0501 04/22/19 1612 04/22/19 2027 04/23/19 0442 04/23/19 0449  NA 139   < > 136   < > 136 132*  K 4.0   < > 4.2   < > 4.2 3.9  CL 92*   < > 91*   < > 90* 86*  CO2 36*   < > 35*  --  33*  --   GLUCOSE 132*   < > 172*   < > 193* 209*  BUN 29*   < > 26*   < > 26* 25*  CREATININE 1.61*   < > 1.48*   < > 1.50* 1.40*  CALCIUM 7.0*   < > 9.4  --  10.1  --   MG 1.9  --   --   --  1.9  --   PHOS 1.4*   < > 1.6*  --  2.1*  --    < > = values in this interval not displayed.   Liver  Function Tests Recent Labs    04/22/19 0500 04/22/19 0500 04/22/19 1612 04/23/19 0442  AST 22  --   --  23  ALT 11  --   --  11  ALKPHOS 61  --   --  65  BILITOT 0.4  --   --  0.4  PROT 5.1*  --   --  5.5*  ALBUMIN 2.6*   < > 2.4* 2.5*   < > = values in this interval not displayed.   No results for input(s): LIPASE, AMYLASE in the last 72 hours. Cardiac Enzymes No results for input(s): CKTOTAL, CKMB, CKMBINDEX, TROPONINI in the last 72 hours.  BNP: BNP (last 3 results) Recent Labs    03/22/19 0401 03/26/19 0626 03/27/19 1255  BNP 340.4* 687.5* 800.4*    ProBNP (last 3 results) No results for input(s): PROBNP in the last 8760 hours.   D-Dimer No results for input(s): DDIMER in the last 72 hours. Hemoglobin A1C No results for input(s): HGBA1C in the last 72 hours. Fasting Lipid Panel No results for input(s): CHOL, HDL, LDLCALC, TRIG, CHOLHDL, LDLDIRECT in the last 72 hours. Thyroid Function Tests No results for input(s): TSH, T4TOTAL, T3FREE, THYROIDAB in the last 72 hours.  Invalid input(s): FREET3  Other results:   Imaging    No results found.   Medications:     Scheduled Medications: . aspirin  81 mg Oral Daily  .  atorvastatin  80 mg Oral q1800  . chlorhexidine gluconate (MEDLINE KIT)  15 mL Mouth Rinse BID  . Chlorhexidine Gluconate Cloth  6 each Topical Q0600  . Chlorhexidine Gluconate Cloth  6 each Topical Q0600  . ciprofloxacin  500 mg Oral BID  . darbepoetin (ARANESP) injection - NON-DIALYSIS  60 mcg Subcutaneous Q Tue-1800  . feeding supplement (NEPRO CARB STEADY)  237 mL Oral TID BM  . feeding supplement (PRO-STAT SUGAR FREE 64)  60 mL Per Tube BID  . Gerhardt's butt cream   Topical BID  . hydrocortisone   Rectal BID  . insulin aspart  0-24 Units Subcutaneous Q4H  . insulin glargine  36 Units Subcutaneous Daily  . mouth rinse  15 mL Mouth Rinse 10 times per day  . metoCLOPramide (REGLAN) injection  5 mg Intravenous Q8H  . midodrine  15 mg Oral TID WC  . multivitamin  1 tablet Oral QHS  . pantoprazole (PROTONIX) IV  40 mg Intravenous Q24H  . simethicone  80 mg Oral BID PC  . sodium chloride flush  10-40 mL Intracatheter Q12H    Infusions: . sodium chloride    . sodium chloride    . sodium chloride    . amiodarone 30 mg/hr (04/23/19 0800)  . calcium gluconate infusion for CRRT Stopped (04/23/19 0700)  .  ceFAZolin (ANCEF) IV    . feeding supplement (OSMOLITE 1.2 CAL) Stopped (04/23/19 0100)  . magnesium sulfate bolus IVPB    . norepinephrine (LEVOPHED) Adult infusion 3 mcg/min (04/23/19 0800)  . prismasol B22GK 4/0 300 mL/hr at 04/22/19 2204  . prismasol B22GK 4/0 1,500 mL/hr at 04/23/19 0500  . sodium citrate 2 %/dextrose 2.5% solution 3000 mL 300 mL/hr at 04/23/19 0221    PRN Medications: sodium chloride, sodium chloride, sodium chloride, oxyCODONE **AND** acetaminophen, dextrose, fentaNYL (SUBLIMAZE) injection, heparin, hyoscyamine, levalbuterol, lidocaine (PF), lidocaine-prilocaine, loperamide HCl, ondansetron (ZOFRAN) IV, pentafluoroprop-tetrafluoroeth, promethazine, Resource ThickenUp Clear, sodium chloride, sodium chloride flush, sodium chloride flush, sodium chloride  flush    Assessment/Plan   1. Acute systolic HF ->  Cardiogenic shock - post-op echo on 03/20/19 EF 25-30% (pre-op 30-35%. In 12/2017 EF normal) - Required Impella support post CABG. Impella removed 2/23 - failed iHD on 3/1.  - Back on CVVHD pulling -100.  CVP 7-8 - Remains on NE for BP support at 3. Also on midodrine 15 tid. Hopefully can wean NE off soon -  Continue to liberalize salt in diet. + TED hose.  2. CAD s/p CABG this admit (LIMA->LAD, SVG->OM, SVG->RCA on 03/15/19) - No s/s ischemia - Continue ASA +statin  3. Acute hypoxic respiratory failure with Pseudomonas PNA - s/p trach on 03/27/19 - on TC with PMV.   - completed meropenem for pseudomonas PNA - trach aspirate now growing Pseudomonas again.  Unclear if this is colonization or active infection.  He is not febrile. We started cipro on 3/7. Will d/w ID pharmD  4. PAF - s/p MAZE and LAA occlusion - Has been in/out AF. Now in NSR - Continue IV amio 30 mg per hour while on NE -  Remains off systemic heparin with hematuria and previous melena.  5. AKI  - due to shock/ATN - On CVVHD. Making minimal urine - For tunneled cath and Pleurx catheter today - Renal plans to hold off on restarting CVVHD today. Agree with plan. Will eventually need rechallenge with iHD   6. Hematuria -Has recurred.  Back on CBI. Continue  7. Melena - Resolved  8. Anemia - Hgb 7.1 today. Transfuse to keep hgb >= 7.5    9. Debility, severe - continue PT  10 UE swelling - u/s negative for DVT.  - Continue wraps.   11. F/E/N - Tube feeds per Cor-trak  12. Left Pleural Effusion  - moderate to large.  Pleurx cath today    CRITICAL CARE Performed by: Glori Bickers  Total critical care time: 35 minutes  Critical care time was exclusive of separately billable procedures and treating other patients.  Critical care was necessary to treat or prevent imminent or life-threatening deterioration.  Critical care was time spent  personally by me (independent of midlevel providers or residents) on the following activities: development of treatment plan with patient and/or surrogate as well as nursing, discussions with consultants, evaluation of patient's response to treatment, examination of patient, obtaining history from patient or surrogate, ordering and performing treatments and interventions, ordering and review of laboratory studies, ordering and review of radiographic studies, pulse oximetry and re-evaluation of patient's condition.     Glori Bickers, MD  8:45 AM

## 2019-04-23 NOTE — Anesthesia Postprocedure Evaluation (Signed)
Anesthesia Post Note  Patient: Jesse Murphy  Procedure(s) Performed: INSERTION PLEURAL DRAINAGE CATHETER TO DRAIN LEFT PLEURAL EFFUSION (Left Chest)     Patient location during evaluation: SICU Anesthesia Type: MAC Level of consciousness: oriented, awake and sedated Pain management: pain level controlled Vital Signs Assessment: post-procedure vital signs reviewed and stable Respiratory status: spontaneous breathing, nonlabored ventilation, respiratory function stable and patient connected to nasal cannula oxygen Cardiovascular status: stable and blood pressure returned to baseline Postop Assessment: no apparent nausea or vomiting Anesthetic complications: no    Last Vitals:  Vitals:   04/23/19 1345 04/23/19 1346  BP: (!) 93/36 (!) 99/36  Pulse: 79 79  Resp: (!) 28 (!) 24  Temp:  (!) 36.1 C  SpO2: (!) 89% 90%    Last Pain:  Vitals:   04/23/19 1346  TempSrc: Axillary  PainSc:                  Kalani Sthilaire COKER

## 2019-04-23 NOTE — Anesthesia Preprocedure Evaluation (Signed)
Anesthesia Evaluation  Patient identified by MRN, date of birth, ID band  Reviewed: Allergy & Precautions, NPO status , Patient's Chart, lab work & pertinent test results  Airway Mallampati: [place]  (+) Teeth Intact   Pulmonary former smoker,     + decreased breath sounds      Cardiovascular hypertension,  Rhythm:Regular Rate:Normal     Neuro/Psych    GI/Hepatic   Endo/Other    Renal/GU      Musculoskeletal   Abdominal   Peds  Hematology   Anesthesia Other Findings   Reproductive/Obstetrics                             Anesthesia Physical Anesthesia Plan  ASA: III  Anesthesia Plan: MAC   Post-op Pain Management:    Induction: Intravenous  PONV Risk Score and Plan: Ondansetron  Airway Management Planned: Tracheostomy  Additional Equipment:   Intra-op Plan:   Post-operative Plan:   Informed Consent: I have reviewed the patients History and Physical, chart, labs and discussed the procedure including the risks, benefits and alternatives for the proposed anesthesia with the patient or authorized representative who has indicated his/her understanding and acceptance.       Plan Discussed with: CRNA and Anesthesiologist  Anesthesia Plan Comments:         Anesthesia Quick Evaluation

## 2019-04-23 NOTE — Progress Notes (Signed)
Per PA, Sherlie Ban,  Note

## 2019-04-24 ENCOUNTER — Inpatient Hospital Stay (HOSPITAL_COMMUNITY): Payer: Medicare Other

## 2019-04-24 LAB — RENAL FUNCTION PANEL
Albumin: 2.2 g/dL — ABNORMAL LOW (ref 3.5–5.0)
Albumin: 2.6 g/dL — ABNORMAL LOW (ref 3.5–5.0)
Anion gap: 13 (ref 5–15)
Anion gap: 15 (ref 5–15)
BUN: 66 mg/dL — ABNORMAL HIGH (ref 8–23)
BUN: 83 mg/dL — ABNORMAL HIGH (ref 8–23)
CO2: 31 mmol/L (ref 22–32)
CO2: 30 mmol/L (ref 22–32)
Calcium: 7.1 mg/dL — ABNORMAL LOW (ref 8.9–10.3)
Calcium: 7.8 mg/dL — ABNORMAL LOW (ref 8.9–10.3)
Chloride: 91 mmol/L — ABNORMAL LOW (ref 98–111)
Chloride: 90 mmol/L — ABNORMAL LOW (ref 98–111)
Creatinine, Ser: 3.11 mg/dL — ABNORMAL HIGH (ref 0.61–1.24)
Creatinine, Ser: 3.84 mg/dL — ABNORMAL HIGH (ref 0.61–1.24)
GFR calc Af Amer: 20 mL/min — ABNORMAL LOW (ref >60)
GFR calc Af Amer: 15 mL/min — ABNORMAL LOW (ref >60)
GFR calc non Af Amer: 17 mL/min — ABNORMAL LOW (ref >60)
GFR calc non Af Amer: 13 mL/min — ABNORMAL LOW (ref >60)
Glucose, Bld: 187 mg/dL — ABNORMAL HIGH (ref 70–99)
Glucose, Bld: 96 mg/dL (ref 70–99)
Phosphorus: 5 mg/dL — ABNORMAL HIGH (ref 2.5–4.6)
Phosphorus: 6.1 mg/dL — ABNORMAL HIGH (ref 2.5–4.6)
Potassium: 4.7 mmol/L (ref 3.5–5.1)
Potassium: 4.8 mmol/L (ref 3.5–5.1)
Sodium: 135 mmol/L (ref 135–145)
Sodium: 135 mmol/L (ref 135–145)

## 2019-04-24 LAB — CBC
HCT: 26.5 % — ABNORMAL LOW (ref 39.0–52.0)
Hemoglobin: 8.2 g/dL — ABNORMAL LOW (ref 13.0–17.0)
MCH: 29.2 pg (ref 26.0–34.0)
MCHC: 30.9 g/dL (ref 30.0–36.0)
MCV: 94.3 fL (ref 80.0–100.0)
Platelets: 190 10*3/uL (ref 150–400)
RBC: 2.81 MIL/uL — ABNORMAL LOW (ref 4.22–5.81)
RDW: 16.5 % — ABNORMAL HIGH (ref 11.5–15.5)
WBC: 9.9 10*3/uL (ref 4.0–10.5)
nRBC: 0 % (ref 0.0–0.2)

## 2019-04-24 LAB — GLUCOSE, CAPILLARY
Glucose-Capillary: 102 mg/dL — ABNORMAL HIGH (ref 70–99)
Glucose-Capillary: 113 mg/dL — ABNORMAL HIGH (ref 70–99)
Glucose-Capillary: 145 mg/dL — ABNORMAL HIGH (ref 70–99)
Glucose-Capillary: 154 mg/dL — ABNORMAL HIGH (ref 70–99)
Glucose-Capillary: 160 mg/dL — ABNORMAL HIGH (ref 70–99)
Glucose-Capillary: 171 mg/dL — ABNORMAL HIGH (ref 70–99)
Glucose-Capillary: 89 mg/dL (ref 70–99)

## 2019-04-24 LAB — MAGNESIUM: Magnesium: 2 mg/dL (ref 1.7–2.4)

## 2019-04-24 LAB — CALCIUM, IONIZED: Calcium, Ionized, Serum: 5.4 mg/dL (ref 4.5–5.6)

## 2019-04-24 MED ORDER — NEPRO/CARBSTEADY PO LIQD
237.0000 mL | Freq: Two times a day (BID) | ORAL | Status: DC
  Administered 2019-04-24 – 2019-05-01 (×4): 237 mL via ORAL

## 2019-04-24 MED ORDER — ALBUMIN HUMAN 25 % IV SOLN
12.5000 g | Freq: Four times a day (QID) | INTRAVENOUS | Status: AC
  Administered 2019-04-24 (×3): 12.5 g via INTRAVENOUS
  Filled 2019-04-24: qty 50
  Filled 2019-04-24: qty 100

## 2019-04-24 MED ORDER — DARBEPOETIN ALFA 100 MCG/0.5ML IJ SOSY
100.0000 ug | PREFILLED_SYRINGE | INTRAMUSCULAR | Status: DC
  Administered 2019-04-24: 100 ug via SUBCUTANEOUS
  Filled 2019-04-24 (×2): qty 0.5

## 2019-04-24 MED ORDER — CIPROFLOXACIN HCL 500 MG PO TABS
500.0000 mg | ORAL_TABLET | ORAL | Status: AC
  Administered 2019-04-25 – 2019-04-27 (×3): 500 mg via ORAL
  Filled 2019-04-24 (×3): qty 1

## 2019-04-24 MED ORDER — PRO-STAT SUGAR FREE PO LIQD
30.0000 mL | Freq: Two times a day (BID) | ORAL | Status: DC
  Administered 2019-04-24 – 2019-05-04 (×18): 30 mL
  Filled 2019-04-24 (×19): qty 30

## 2019-04-24 MED ORDER — JEVITY 1.5 CAL/FIBER PO LIQD
1000.0000 mL | ORAL | Status: DC
  Administered 2019-04-24 – 2019-05-03 (×6): 1000 mL via ORAL
  Filled 2019-04-24 (×11): qty 1000

## 2019-04-24 MED ORDER — CHLORHEXIDINE GLUCONATE CLOTH 2 % EX PADS
6.0000 | MEDICATED_PAD | Freq: Every day | CUTANEOUS | Status: DC
  Administered 2019-04-25 – 2019-04-27 (×3): 6 via TOPICAL

## 2019-04-24 NOTE — Progress Notes (Signed)
EVENING ROUNDS NOTE :     [address]       [place] [address]             [phone]                 1 Day Post-Op Procedure(s) (LRB): INSERTION PLEURAL DRAINAGE CATHETER TO DRAIN LEFT PLEURAL EFFUSION (Left)   Total Length of Stay:  LOS: 41 days  Events:  No events    BP (!) 101/38   Pulse 72   Temp 97.9 F (36.6 C) (Oral)   Resp 16   Ht 5\' 8"  (1.727 m)   Wt 83.2 kg   SpO2 100%   BMI 27.89 kg/m   CVP:  [3 mmHg-17 mmHg] 7 mmHg  FiO2 (%):  [35 %-40 %] 35 %  . sodium chloride    . sodium chloride    . sodium chloride    . amiodarone 30 mg/hr (04/24/19 1900)  . calcium gluconate infusion for CRRT Stopped (04/23/19 0700)  . feeding supplement (JEVITY 1.5 CAL/FIBER) 1,000 mL (04/24/19 1542)  . magnesium sulfate bolus IVPB    . norepinephrine (LEVOPHED) Adult infusion Stopped (04/24/19 1800)  . prismasol B22GK 4/0 300 mL/hr at 04/22/19 2204  . prismasol B22GK 4/0 1,500 mL/hr at 04/23/19 0500  . sodium citrate 2 %/dextrose 2.5% solution 3000 mL 300 mL/hr at 04/23/19 0221    I/O last 3 completed shifts: In: 14474.3 [I.V.:789.6; Blood:326; Other:12000; NG/GT:1320.3; IV Piggyback:38.3] Out: 16500 [Urine:15500; Other:900; Chest Tube:100]   CBC Latest Ref Rng & Units 04/24/2019 04/23/2019 04/23/2019  WBC 4.0 - 10.5 K/uL 9.9 9.9 -  Hemoglobin 13.0 - 17.0 g/dL 8.2(L) 8.6(L) 7.5(L)  Hematocrit 39.0 - 52.0 % 26.5(L) 27.2(L) 22.0(L)  Platelets 150 - 400 K/uL 190 204 -    BMP Latest Ref Rng & Units 04/24/2019 04/24/2019 04/23/2019  Glucose 70 - 99 mg/dL 96 187(H) 212(H)  BUN 8 - 23 mg/dL 83(H) 66(H) 43(H)  Creatinine 0.61 - 1.24 mg/dL 3.84(H) 3.11(H) 2.37(H)  Sodium 135 - 145 mmol/L 135 135 133(L)  Potassium 3.5 - 5.1 mmol/L 4.8 4.7 4.5  Chloride 98 - 111 mmol/L 90(L) 91(L) 89(L)  CO2 22 - 32 mmol/L 30 31 32  Calcium 8.9 - 10.3 mg/dL 7.8(L) 7.1(L) 6.8(L)    ABG    Component Value Date/Time   PHART 7.488 (H) 04/07/2019 0405   PCO2ART 34.4 04/07/2019 0405     PO2ART 101.0 04/07/2019 0405   HCO3 25.9 04/07/2019 0405   TCO2 36 (H) 04/23/2019 0449   ACIDBASEDEF 1.0 04/05/2019 0540   O2SAT 67.8 04/14/2019 1253       Melodie Bouillon, MD 04/24/2019 7:51 PM

## 2019-04-24 NOTE — Anesthesia Postprocedure Evaluation (Signed)
Anesthesia Post Note  Patient: Jesse Murphy  Procedure(s) Performed: INSERTION PLEURAL DRAINAGE CATHETER TO DRAIN LEFT PLEURAL EFFUSION (Left Chest)     Patient location during evaluation: SICU Anesthesia Type: MAC Level of consciousness: awake and alert Pain management: pain level controlled Vital Signs Assessment: post-procedure vital signs reviewed and stable Respiratory status: spontaneous breathing, nonlabored ventilation, respiratory function stable and patient connected to nasal cannula oxygen Cardiovascular status: stable and blood pressure returned to baseline Anesthetic complications: no    Last Vitals:  Vitals:   04/24/19 1200 04/24/19 1300  BP: (!) 115/47 (!) 112/41  Pulse: 74 77  Resp: 17 (!) 22  Temp:    SpO2: 92% 95%    Last Pain:  Vitals:   04/24/19 1200  TempSrc:   PainSc: Asleep                 Terek Bee COKER

## 2019-04-24 NOTE — Progress Notes (Signed)
  Speech Language Pathology Treatment: [name] Speaking valve  Patient Details Name: Jesse Murphy MRN: [id number] DOB: 27-Feb-1928 Today's Date: 04/24/2019 Time: 0388-8280 SLP Time Calculation (min) (ACUTE ONLY): 15 min  Assessment / Plan / Recommendation Clinical Impression  Pt seen for PMV.  Valve placed on #4 cuffless trach - pt continues to demonstrate generally good toleration; Sp02 dropped to mid 80s on one occasion, but only briefly with immediate return to >90%.  Pt's phonation is strong, with good respiratory support and timing for efficient voicing.  Independent with voicing/speech clarity today.   Pt continues with poor appetite.  RD plans to decrease TF.  Pt will benefit from repeat swallow study this week to determine ability to progress from thickened liquids.  Pt agrees with plan. PMV removed upon departure so that pt could sleep. SLP will continue to follow.   HPI HPI: 86 y.o. male with PMH: h/o CAD s/p CABG and clipping of left atrial appendage, who presented with SOB on 2/1. He became hypoxic on 2/2 with pulmonary edema and needed to be intubated on 2/2 and extubated 2/5. Reintubated 2/8.  Pt underwent impella placement and tracheostomy on 2/9. Pt started on CRRT 2/11. ATC trials on 2/17. Impella removal 2/23.      SLP Plan  Continue with current plan of care       Recommendations  Diet recommendations: Dysphagia 2 (fine chop);Nectar-thick liquid Liquids provided via: Cup;Straw Medication Administration: Crushed with puree Supervision: Full supervision/cueing for compensatory strategies;Staff to assist with self feeding Compensations: Slow rate;Small sips/bites;Follow solids with liquid;Effortful swallow Postural Changes and/or Swallow Maneuvers: Seated upright 90 degrees      Patient may use Passy-Muir Speech Valve: During all waking hours (remove during sleep);During all therapies with supervision;During PO intake/meals         Oral Care Recommendations: Oral  care BID SLP Visit Diagnosis: Aphonia (R49.1) Plan: Continue with current plan of care       GO              [name] L. Tivis Ringer, [place] Office number [phone] Pager [phone]   Jesse Murphy 04/24/2019, 1:27 PM

## 2019-04-24 NOTE — Progress Notes (Signed)
Patient ID: Jesse Murphy, male   DOB: 05/15/1928, 86 y.o.   MRN: [id number]    Advanced Heart Failure Rounding Note  PCP-Cardiologist: Suresh Koneswaran, MD   Subjective:    Impella removed 2/23. Intraoperative TEE showed EF ~40%  Had tunneled cath placed yesterday and left PleurX tube.   Now in chair on trach collar. Remains very weak. Not making much urine.   On NE 3 and midodrine 15 tid  Remains in NSR on IV amio    Objective:   Weight Range: 83.2 kg Body mass index is 27.89 kg/m.   Vital Signs:   Temp:  [97 F (36.1 C)-100.1 F (37.8 C)] 97.8 F (36.6 C) (03/09 0846) Pulse Rate:  [70-86] 84 (03/09 0744) Resp:  [12-28] 20 (03/09 0823) BP: (79-120)/(31-52) 97/38 (03/09 0744) SpO2:  [86 %-96 %] 90 % (03/09 0744) FiO2 (%):  [35 %-40 %] 40 % (03/09 0823) Last BM Date: 04/22/19  Weight change: Filed Weights   04/20/19 0500 04/22/19 0600 04/23/19 0700  Weight: 87 kg 85.7 kg 83.2 kg    Intake/Output:   Intake/Output Summary (Last 24 hours) at 04/24/2019 1046 Last data filed at 04/24/2019 0700 Gross per 24 hour  Intake 7892.96 ml  Output 6900 ml  Net 992.96 ml      Physical Exam   General:  Sitting in chair on trach collar HEENT: normal Neck: supple. JVP 7 + trach  Carotids 2+ bilat; no bruits. No lymphadenopathy or thryomegaly appreciated. Cor: PMI nondisplaced. Regular rate & rhythm. No rubs, gallops or murmurs. Sternal wound ok L subclav tunneled cath Lungs: clear Abdomen: soft, nontender, nondistended. No hepatosplenomegaly. No bruits or masses. Good bowel sounds. Extremities: no cyanosis, clubbing, rash, 2+ edema Neuro: awake following commands moves all 4    Telemetry   NSR 80-90s personally reviewed   Labs    CBC Recent Labs    04/23/19 1827 04/24/19 0456  WBC 9.9 9.9  HGB 8.6* 8.2*  HCT 27.2* 26.5*  MCV 94.8 94.3  PLT 204 190   Basic Metabolic Panel Recent Labs    04/23/19 0442 04/23/19 0449 04/23/19 1827 04/24/19 0456  NA  136   < > 133* 135  K 4.2   < > 4.5 4.7  CL 90*   < > 89* 91*  CO2 33*   < > 32 31  GLUCOSE 193*   < > 212* 187*  BUN 26*   < > 43* 66*  CREATININE 1.50*   < > 2.37* 3.11*  CALCIUM 10.1   < > 6.8* 7.1*  MG 1.9  --   --  2.0  PHOS 2.1*   < > 4.1 5.0*   < > = values in this interval not displayed.   Liver Function Tests Recent Labs    04/22/19 0500 04/22/19 1612 04/23/19 0442 04/23/19 0442 04/23/19 1827 04/24/19 0456  AST 22  --  23  --   --   --   ALT 11  --  11  --   --   --   ALKPHOS 61  --  65  --   --   --   BILITOT 0.4  --  0.4  --   --   --   PROT 5.1*  --  5.5*  --   --   --   ALBUMIN 2.6*   < > 2.5*   < > 2.3* 2.2*   < > = values in this interval not displayed.     No results for input(s): LIPASE, AMYLASE in the last 72 hours. Cardiac Enzymes No results for input(s): CKTOTAL, CKMB, CKMBINDEX, TROPONINI in the last 72 hours.  BNP: BNP (last 3 results) Recent Labs    03/22/19 0401 03/26/19 0626 03/27/19 1255  BNP 340.4* 687.5* 800.4*    ProBNP (last 3 results) No results for input(s): PROBNP in the last 8760 hours.   D-Dimer No results for input(s): DDIMER in the last 72 hours. Hemoglobin A1C No results for input(s): HGBA1C in the last 72 hours. Fasting Lipid Panel No results for input(s): CHOL, HDL, LDLCALC, TRIG, CHOLHDL, LDLDIRECT in the last 72 hours. Thyroid Function Tests No results for input(s): TSH, T4TOTAL, T3FREE, THYROIDAB in the last 72 hours.  Invalid input(s): FREET3  Other results:   Imaging    DG Chest Port 1 View  Result Date: 04/24/2019 CLINICAL DATA:  Respiratory failure.  CABG. EXAM: PORTABLE CHEST 1 VIEW COMPARISON:  Yesterday FINDINGS: Stable heart size. Prior CABG and left atrial clipping. Bilateral central line with tips in good position. Tracheostomy tube in place. Unchanged hazy opacification at the bases from atelectasis and pleural fluid by recent CT. No visible pneumothorax. No change in interstitial markings.  IMPRESSION: Stable hardware positioning and hazy chest opacification that correlates with atelectasis and pleural fluid on most recent chest CT. Electronically Signed   By: Monte Fantasia M.D.   On: 04/24/2019 08:46   DG Chest Portable 1 View  Result Date: 04/23/2019 CLINICAL DATA:  Status post pleural drainage catheter for left pleural effusion. EXAM: PORTABLE CHEST 1 VIEW COMPARISON:  04/22/2019 FINDINGS: Tracheostomy. Dialysis catheter with tip at low SVC or cavoatrial junction. Left atrial occlusion device. Prior median sternotomy. The left-sided drainage catheter projects over the mid chest. Decrease in left pleural effusion. Possible tiny left apical pneumothorax, less than 5%. Right PICC line tip difficult to visualize centrally. Mild cardiomegaly. Mild interstitial edema. Left greater than right base atelectasis. IMPRESSION: Placement of a left-sided pleural drainage catheter with. Decreased left pleural effusion. Possible tiny left apical pneumothorax. Congestive heart failure with bibasilar atelectasis Electronically Signed   By: Abigail Miyamoto M.D.   On: 04/23/2019 12:46   DG C-Arm 1-60 Min-No Report  Result Date: 04/23/2019 Fluoroscopy was utilized by the requesting physician.  No radiographic interpretation.     Medications:     Scheduled Medications: . aspirin  81 mg Oral Daily  . atorvastatin  80 mg Oral q1800  . chlorhexidine gluconate (MEDLINE KIT)  15 mL Mouth Rinse BID  . Chlorhexidine Gluconate Cloth  6 each Topical Q0600  . Chlorhexidine Gluconate Cloth  6 each Topical Q0600  . ciprofloxacin  500 mg Oral BID  . darbepoetin (ARANESP) injection - NON-DIALYSIS  100 mcg Subcutaneous Q Tue-1800  . feeding supplement (NEPRO CARB STEADY)  237 mL Oral TID BM  . feeding supplement (PRO-STAT SUGAR FREE 64)  60 mL Per Tube BID  . Gerhardt's butt cream   Topical BID  . hydrocortisone   Rectal BID  . insulin aspart  0-24 Units Subcutaneous Q4H  . insulin glargine  36 Units  Subcutaneous Daily  . mouth rinse  15 mL Mouth Rinse 10 times per day  . midodrine  15 mg Oral TID WC  . multivitamin  1 tablet Oral QHS  . pantoprazole (PROTONIX) IV  40 mg Intravenous Q24H  . simethicone  80 mg Oral BID PC  . sodium chloride flush  10-40 mL Intracatheter Q12H    Infusions: . sodium chloride    .  sodium chloride    . sodium chloride    . albumin human 12.5 g (04/24/19 0859)  . amiodarone 30 mg/hr (04/24/19 0700)  . calcium gluconate infusion for CRRT Stopped (04/23/19 0700)  . feeding supplement (OSMOLITE 1.2 CAL) 50 mL/hr at 04/24/19 0500  . magnesium sulfate bolus IVPB    . norepinephrine (LEVOPHED) Adult infusion 3 mcg/min (04/24/19 0841)  . prismasol B22GK 4/0 300 mL/hr at 04/22/19 2204  . prismasol B22GK 4/0 1,500 mL/hr at 04/23/19 0500  . sodium citrate 2 %/dextrose 2.5% solution 3000 mL 300 mL/hr at 04/23/19 0221    PRN Medications: sodium chloride, sodium chloride, sodium chloride, oxyCODONE **AND** acetaminophen, dextrose, fentaNYL (SUBLIMAZE) injection, hyoscyamine, levalbuterol, lidocaine (PF), lidocaine-prilocaine, loperamide HCl, ondansetron (ZOFRAN) IV, promethazine, Resource ThickenUp Clear, sodium chloride, sodium chloride flush, sodium chloride flush, sodium chloride flush    Assessment/Plan   1. Acute systolic HF -> Cardiogenic shock - post-op echo on 03/20/19 EF 25-30% (pre-op 30-35%. In 12/2017 EF normal) - Required Impella support post CABG. Impella removed 2/23 - failed iHD on 3/1.  - CVVHD now off - Remains on NE for BP support at 3. Also on midodrine 15 tid. We will try to wean.  - Continue to liberalize salt in diet. + TED hose.  2. CAD s/p CABG this admit (LIMA->LAD, SVG->OM, SVG->RCA on 03/15/19) - No s/s ischemia - Continue ASA +statin  3. Acute hypoxic respiratory failure with Pseudomonas PNA - s/p trach on 03/27/19 - on TC with PMV.   - completed meropenem for pseudomonas PNA - trach aspirate now growing Pseudomonas again.     - now on cipro. Will continue for 14 days. Discussed dosing with PharmD personally.  4. PAF - s/p MAZE and LAA occlusion - Has been in/out AF. Now in NSR - Continue IV amio 30 mg per hour while on NE - Remains off systemic heparin with hematuria and previous melena. - No change today  5. AKI  - due to shock/ATN - On CVVHD. Making minimal urine - Tunneled cath placed 3/9  - Will need to rechallenge with iHD. Agree with Renal that long-term options becoming very limited. If fails to tolerate iHD off NE then would seem to be palliative situation    6. Hematuria -Has recurred.  Back on CBI. Continue  7. Melena - Resolved  8. Anemia - Hgb 8.2 today. Transfuse to keep hgb >= 7.5    9. Debility, severe - continue PT  10 UE swelling - u/s negative for DVT.  - Continue wraps.   11. F/E/N - Tube feeds per Cor-trak  12. Left Pleural Effusion  - moderate to large. - s/p PleurX on 3/8. CXR improved.     CRITICAL CARE Performed by: Glori Bickers  Total critical care time: 35 minutes  Critical care time was exclusive of separately billable procedures and treating other patients.  Critical care was necessary to treat or prevent imminent or life-threatening deterioration.  Critical care was time spent personally by me (independent of midlevel providers or residents) on the following activities: development of treatment plan with patient and/or surrogate as well as nursing, discussions with consultants, evaluation of patient's response to treatment, examination of patient, obtaining history from patient or surrogate, ordering and performing treatments and interventions, ordering and review of laboratory studies, ordering and review of radiographic studies, pulse oximetry and re-evaluation of patient's condition.     Glori Bickers, MD  10:46 AM

## 2019-04-24 NOTE — Progress Notes (Signed)
PHARMACY NOTE:  ANTIMICROBIAL RENAL DOSAGE ADJUSTMENT  Current antimicrobial regimen includes a mismatch between antimicrobial dosage and estimated renal function.  As per policy approved by the Pharmacy & Therapeutics and Medical Executive Committees, the antimicrobial dosage will be adjusted accordingly.  Current antimicrobial dosage: ciprofloxacin 500mg  po q12h  Indication: pseudomonas PNA  Renal Function: Patient previously on CRRT which was stopped yesterday and will be transitioned to [facility] with first session tomorrow 3/10  Estimated Creatinine Clearance: 17.3 mL/min (A) (by C-G formula based on SCr of 3.11 mg/dL (H)). [x]      On intermittent HD, scheduled: []      On CRRT    Antimicrobial dosage has been changed to:  Ciprofloxacin 500mg  po q24h   Additional comments:   Thank you for allowing pharmacy to be a part of this patient's care.  Henri Medal, [facility] 04/24/2019 11:14 AM

## 2019-04-24 NOTE — Progress Notes (Addendum)
1 Day Post-Op Procedure(s) (LRB): INSERTION PLEURAL DRAINAGE CATHETER TO DRAIN LEFT PLEURAL EFFUSION (Left) Subjective: Up in chair on trach collar C/o nausea CXR improved after L Pleurx, HD cath in good position Creat slowly rising 1.5>>2.2>>3.1 Objective: Vital signs in last 24 hours: Temp:  [97 F (36.1 C)-100.1 F (37.8 C)] 99.2 F (37.3 C) (03/09 0400) Pulse Rate:  [70-86] 86 (03/09 0700) Cardiac Rhythm: Normal sinus rhythm (03/08 2015) Resp:  [12-28] 20 (03/09 0823) BP: (79-120)/(28-52) 104/40 (03/09 0600) SpO2:  [86 %-96 %] 94 % (03/09 0700) FiO2 (%):  [35 %-40 %] 40 % (03/09 0823)  Hemodynamic parameters for last 24 hours: CVP:  [1 mmHg-17 mmHg] 3 mmHg  Intake/Output from previous day: 03/08 0701 - 03/09 0700 In: 7912.5 [I.V.:566.1; Blood:326; NG/GT:1020.3] Out: 6900 [Urine:6000] Intake/Output this shift: No intake/output data recorded.       Exam    General- alert and comfortable up in chair    Neck- no JVD, no cervical adenopathy palpable, no carotid bruit   Lungs- clear without rales, wheezes, L pleurx dressing dry   Cor- regular rate and rhythm, no murmur , gallop   Abdomen- soft, non-tender   Extremities - warm, non-tender, minimal edema   Neuro- oriented, appropriate, no focal weakness   Lab Results: Recent Labs    04/23/19 1827 04/24/19 0456  WBC 9.9 9.9  HGB 8.6* 8.2*  HCT 27.2* 26.5*  PLT 204 190   BMET:  Recent Labs    04/23/19 1827 04/24/19 0456  NA 133* 135  K 4.5 4.7  CL 89* 91*  CO2 32 31  GLUCOSE 212* 187*  BUN 43* 66*  CREATININE 2.37* 3.11*  CALCIUM 6.8* 7.1*    PT/INR: No results for input(s): LABPROT, INR in the last 72 hours. ABG    Component Value Date/Time   PHART 7.488 (H) 04/07/2019 0405   HCO3 25.9 04/07/2019 0405   TCO2 36 (H) 04/23/2019 0449   ACIDBASEDEF 1.0 04/05/2019 0540   O2SAT 67.8 04/14/2019 1253   CBG (last 3)  Recent Labs    04/23/19 2029 04/24/19 0045 04/24/19 0434  GLUCAP 126* 160* 171*     Assessment/Plan: S/P Procedure(s) (LRB): INSERTION PLEURAL DRAINAGE CATHETER TO DRAIN LEFT PLEURAL EFFUSION (Left) Cardiac and pulmonary fx stable,norepi at 4 mcg and wean for SAP > 110 Daily assessment for HD requirement Follow anemia- Hb 8.2 Pleural fluid cultures no growth so far  LOS: 41 days    Tharon Aquas Trigt III 04/24/2019

## 2019-04-24 NOTE — Progress Notes (Signed)
[facility] Progress Note    Assessment/ Plan:   1. AKI(Cr was 0.7 prior to surgery)- due to postoperative CHF/cardiogenic - started on CRRT on 03/29/19 - 2/24 -   off after clotting. Attempted IHD on 2/26 with minimal UF and note increased pressors and rapid afib.  Then CRRT on 3/1-- 3/8 at 0645. Had been onToleratingnet UF -CVP currently 6-9 + Off CRRT for procedures yest.  No absolute need for dialysis but BUN and crt rising which indicates he will not be able to fly off of RRT.  I talked to him about doing intermittent HD today which he refused citing he is too tired and in too much pain from procedures yesterday.  It does not seem that he is refusing dialysis all together.  Will plan for attempted IHD tomorrow via [facility] 2. Acute systolic CHF/cardiogenic shock- EF 25-30%; s/p Impella- now out. optimize volume as above. On midodrine  15 mg TID.  Can we wean off of pressors ?? 3. CAD s/p [id number] 0/24/09 complicated by cardiogenic shock and ARF.Postop echo EF 25-30% 4. Acute hypoxic respiratory failure with pseudomonas pneumonia- s/p trach on 03/27/19.s/pabx per PCCM-> completed abx  5. Hematuria- bladder irrigationper urology recommendations- still pretty dark. 6. ABLA- Hx Melena. Transfuse as needed. Have given RRBC's on 2/22 and 2/23. And 2/25. Gave aranesp 2/23 40 mcg andthen60 mcg on3/2.  Needs higher dose today  7. Protein malnutrition- moderate to severe 8. Secondary hyperparathyroidism/bone mineral - stopped therenvela. phos rising.  pth 116 9. Disposition-  he is a poor longterm dialysis candidate given his cardiogenic shock and trach.  If trach not able to be removed or converted to VERY low maintenance would need LTACH  Subjective:   yest had tunneled HD cath- pleurx placed-  Stayed off of CRRT-  Unable to tell UOP due to CBI- remains on very low dose norepi- sitting up in bedside chair    Objective:   BP (!) 104/40   Pulse 86   Temp 99.2 F  (37.3 C) (Oral)   Resp (!) 22   Ht _0  (1.727 m)   Wt 83.2 kg   SpO2 94%   BMI 27.89 kg/m   Intake/Output Summary (Last 24 hours) at 04/24/2019 0719 Last data filed at 04/24/2019 0700 Gross per 24 hour  Intake 4912.45 ml  Output 6900 ml  Net -1987.55 ml   Weight change:   Physical Exam: Gen: elderly male in bed chronically ill-appearing- alert, responsive HEENT trach in place; on vent  CVS: S1 S2 no rub Resp:clear;unlabored at rest; trach collar Abd: soft/ NT/ND  Ext:1-2+edema, in thighs as well Neuro - awake and interactive and follows commands, nods GU foley in place withirrigation, pretty dark but no clots noted Access: is due to get tunneled HD cath today   Imaging: IR Fluoro Guide CV Line Left  Result Date: 04/23/2019 INDICATION: 87 year old male with a history of renal failure EXAM: TUNNELED CENTRAL VENOUS HEMODIALYSIS CATHETER PLACEMENT WITH ULTRASOUND AND FLUOROSCOPIC GUIDANCE MEDICATIONS: 2 g Ancef. The antibiotic was given in an appropriate time interval prior to skin puncture. ANESTHESIA/SEDATION: Moderate (conscious) sedation was employed during this procedure. A total of Versed 1.0 mg and Fentanyl 50 mcg was administered intravenously. Moderate Sedation Time: 19 minutes. The patient's level of consciousness and vital signs were monitored continuously by radiology nursing throughout the procedure under my direct supervision. FLUOROSCOPY TIME:  Fluoroscopy Time: 0 minutes 24 seconds (1 mGy). COMPLICATIONS: None PROCEDURE: The procedure, risks, benefits, and alternatives were explained  to the patient. Questions regarding the procedure were encouraged and answered. The patient understands and consents to the procedure. The left neck and chest was prepped with chlorhexidine, and draped in the usual sterile fashion using maximum barrier technique (cap and mask, sterile gown, sterile gloves, large sterile sheet, hand hygiene and cutaneous antiseptic). Antibiotic prophylaxis  was initiated with 2.0g Ancef administered IV within 1 hour prior to skin incision. Local anesthesia was attained by infiltration with 1% lidocaine without epinephrine. Ultrasound demonstrated patency of the left IJ vein, and this was documented with an image. Under real-time ultrasound guidance, this vein was accessed with a 21 gauge micropuncture needle and image documentation was performed. A small dermatotomy was made at the access site with an 11 scalpel. A 0.018" wire was advanced into the SVC and the access needle exchanged for a 12F micropuncture vascular sheath. The 0.018" wire was then removed and a 0.035" wire advanced into the IVC. Upon withdrawal of the 018 wire, the wire was marked for appropriate length of the internal portion of the catheter. A 23 cm tip to cuff split tip catheter was selected. The skin and subcutaneous tissues of the left chest wall were then generously infiltrated with 1% lidocaine without epinephrine from the chest wall to the puncture site at the left IJ. The hemodialysis catheter was then back tunneled from the right chest wall incision to the dermatotomy at the left neck. The venous access site was then serially dilated and a peel away vascular sheath placed over the wire. The wire was removed and the catheter was placed through the peel-away sheath. The catheter tip is positioned in the upper right atrium. This was documented with a spot image. Both ports of the hemodialysis catheter were then tested for excellent function. The ports were then locked with heparinized lock. The incision at the neck was then sealed with Dermabond. Patient tolerated the procedure well and remained hemodynamically stable throughout. No complications were encountered and no significant blood loss was encountered. IMPRESSION: Status post left IJ tunneled hemodialysis catheter. Catheter ready for use. Signed, Dulcy Fanny. Dellia Nims, RPVI [facility]  Radiology Electronically Signed   By: Corrie Mckusick D.O.   On: 04/23/2019 10:53   IR US Guide Vasc Access Left  Result Date: 04/23/2019 INDICATION: 86 year old male with a history of renal failure EXAM: TUNNELED CENTRAL VENOUS HEMODIALYSIS CATHETER PLACEMENT WITH ULTRASOUND AND FLUOROSCOPIC GUIDANCE MEDICATIONS: 2 g Ancef. The antibiotic was given in an appropriate time interval prior to skin puncture. ANESTHESIA/SEDATION: Moderate (conscious) sedation was employed during this procedure. A total of Versed 1.0 mg and Fentanyl 50 mcg was administered intravenously. Moderate Sedation Time: 19 minutes. The patient's level of consciousness and vital signs were monitored continuously by radiology nursing throughout the procedure under my direct supervision. FLUOROSCOPY TIME:  Fluoroscopy Time: 0 minutes 24 seconds (1 mGy). COMPLICATIONS: None PROCEDURE: The procedure, risks, benefits, and alternatives were explained to the patient. Questions regarding the procedure were encouraged and answered. The patient understands and consents to the procedure. The left neck and chest was prepped with chlorhexidine, and draped in the usual sterile fashion using maximum barrier technique (cap and mask, sterile gown, sterile gloves, large sterile sheet, hand hygiene and cutaneous antiseptic). Antibiotic prophylaxis was initiated with 2.0g Ancef administered IV within 1 hour prior to skin incision. Local anesthesia was attained by infiltration with 1% lidocaine without epinephrine. Ultrasound demonstrated patency of the left IJ vein, and this was documented with an image. Under  real-time ultrasound guidance, this vein was accessed with a 21 gauge micropuncture needle and image documentation was performed. A small dermatotomy was made at the access site with an 11 scalpel. A 0.018" wire was advanced into the SVC and the access needle exchanged for a 70F micropuncture vascular sheath. The 0.018" wire was then removed and a 0.035" wire  advanced into the IVC. Upon withdrawal of the 018 wire, the wire was marked for appropriate length of the internal portion of the catheter. A 23 cm tip to cuff split tip catheter was selected. The skin and subcutaneous tissues of the left chest wall were then generously infiltrated with 1% lidocaine without epinephrine from the chest wall to the puncture site at the left IJ. The hemodialysis catheter was then back tunneled from the right chest wall incision to the dermatotomy at the left neck. The venous access site was then serially dilated and a peel away vascular sheath placed over the wire. The wire was removed and the catheter was placed through the peel-away sheath. The catheter tip is positioned in the upper right atrium. This was documented with a spot image. Both ports of the hemodialysis catheter were then tested for excellent function. The ports were then locked with heparinized lock. The incision at the neck was then sealed with Dermabond. Patient tolerated the procedure well and remained hemodynamically stable throughout. No complications were encountered and no significant blood loss was encountered. IMPRESSION: Status post left IJ tunneled hemodialysis catheter. Catheter ready for use. Signed, Dulcy Fanny. Dellia Nims, RPVI [facility] Radiology Electronically Signed   By: Corrie Mckusick D.O.   On: 04/23/2019 10:53   DG Chest Portable 1 View  Result Date: 04/23/2019 CLINICAL DATA:  Status post pleural drainage catheter for left pleural effusion. EXAM: PORTABLE CHEST 1 VIEW COMPARISON:  04/22/2019 FINDINGS: Tracheostomy. Dialysis catheter with tip at low SVC or cavoatrial junction. Left atrial occlusion device. Prior median sternotomy. The left-sided drainage catheter projects over the mid chest. Decrease in left pleural effusion. Possible tiny left apical pneumothorax, less than 5%. Right PICC line tip difficult to visualize centrally. Mild cardiomegaly.  Mild interstitial edema. Left greater than right base atelectasis. IMPRESSION: Placement of a left-sided pleural drainage catheter with. Decreased left pleural effusion. Possible tiny left apical pneumothorax. Congestive heart failure with bibasilar atelectasis Electronically Signed   By: Abigail Miyamoto M.D.   On: 04/23/2019 12:46   DG C-Arm 1-60 Min-No Report  Result Date: 04/23/2019 Fluoroscopy was utilized by the requesting physician.  No radiographic interpretation.    Labs: BMET Recent Labs  Lab 04/21/19 0313 04/21/19 0328 04/21/19 1652 04/21/19 1709 04/22/19 0500 04/22/19 0501 04/22/19 1612 04/22/19 2027 04/23/19 0016 04/23/19 0021 04/23/19 0441 04/23/19 0442 04/23/19 0449 04/23/19 1827 04/24/19 0456  NA 133*   < > 135   < > 139   < > 136   < > 138 133* 138 136 132* 133* 135  K 3.9   < > 4.3   < > 4.0   < > 4.2   < > 4.1 4.1 4.0 4.2 3.9 4.5 4.7  CL 87*   < > 90*   < > 92*   < > 91*   < > 88* 87* 89* 90* 86* 89* 91*  CO2 34*  --  33*  --  36*  --  35*  --   --   --   --  33*  --  32 31  GLUCOSE 238*   < >  264*   < > 132*   < > 172*   < > 145* 176* 144* 193* 209* 212* 187*  BUN 48*   < > 36*   < > 29*   < > 26*   < > 24* 27* 22 26* 25* 43* 66*  CREATININE 2.39*   < > 1.95*   < > 1.61*   < > 1.48*   < > 1.30* 1.50* 1.20 1.50* 1.40* 2.37* 3.11*  CALCIUM 11.3*  --  10.0  --  7.0*  --  9.4  --   --   --   --  10.1  --  6.8* 7.1*  PHOS 1.6*  --  1.3*  --  1.4*  --  1.6*  --   --   --   --  2.1*  --  4.1 5.0*   < > = values in this interval not displayed.   CBC Recent Labs  Lab 04/19/19 0401 04/19/19 0402 04/22/19 1612 04/22/19 2027 04/23/19 0442 04/23/19 0449 04/23/19 1827 04/24/19 0456  WBC 11.8*   < > 8.9  --  10.5  --  9.9 9.9  NEUTROABS 9.2*  --   --   --   --   --   --   --   HGB 8.5*   < > 7.0*   < > 7.1* 7.5* 8.6* 8.2*  HCT 28.1*   < > 23.2*   < > 24.0* 22.0* 27.2* 26.5*  MCV 96.6   < > 97.1  --  97.6  --  94.8 94.3  PLT 255   < > 191  --  219  --  204 190   <  > = values in this interval not displayed.    Medications:    . aspirin  81 mg Oral Daily  . atorvastatin  80 mg Oral q1800  . chlorhexidine gluconate (MEDLINE KIT)  15 mL Mouth Rinse BID  . Chlorhexidine Gluconate Cloth  6 each Topical Q0600  . Chlorhexidine Gluconate Cloth  6 each Topical Q0600  . ciprofloxacin  500 mg Oral BID  . darbepoetin (ARANESP) injection - NON-DIALYSIS  60 mcg Subcutaneous Q Tue-1800  . feeding supplement (NEPRO CARB STEADY)  237 mL Oral TID BM  . feeding supplement (PRO-STAT SUGAR FREE 64)  60 mL Per Tube BID  . Gerhardt's butt cream   Topical BID  . hydrocortisone   Rectal BID  . insulin aspart  0-24 Units Subcutaneous Q4H  . insulin glargine  36 Units Subcutaneous Daily  . mouth rinse  15 mL Mouth Rinse 10 times per day  . metoCLOPramide (REGLAN) injection  5 mg Intravenous Q8H  . midodrine  15 mg Oral TID WC  . multivitamin  1 tablet Oral QHS  . pantoprazole (PROTONIX) IV  40 mg Intravenous Q24H  . simethicone  80 mg Oral BID PC  . sodium chloride flush  10-40 mL Intracatheter Q12H      Kayl Stogdill A Loukisha Gunnerson  04/24/2019, 7:19 AM

## 2019-04-24 NOTE — Progress Notes (Signed)
Physical Therapy Treatment Patient Details Name: Jesse Murphy MRN: [id number] DOB: Aug 06, 1928 Today's Date: 04/24/2019    History of Present Illness 86 y.o. male with PMH: h/o CAD s/p CABG and clipping of left atrial appendage, who presented with SOB on 2/1. He became hypoxic on 2/2 with pulmonary edema and needed to be intubated on 2/2 and extubated 2/5. Pt underwent impella placement and tracheostomy on 2/9. Pt started on CRRT 2/11. Impella removal and tracheostomy 2/23. CRRT restarted 3/1 as pt could not tolerate HD.    PT Comments    Patient progressing slowly towards PT goals. Requires some coaxing initially due to feeling tired. Requires Max A for bed mobility and Mod A of 2 for standing. Able to take a few steps along side bed with Mod A of 2. Noted to have BLE weakness but no buckling. Sp02 dropped to 85% on trach collar, 40% Fi02 10L TC (with PMV donned), removed PMV and able to maintain sats >90% with activity. Pt will likely be ready for gait training next session. Will follow.   Follow Up Recommendations  SNF;Supervision/Assistance - 24 hour     Equipment Recommendations  Other (comment)(defer)    Recommendations for Other Services       Precautions / Restrictions Precautions Precautions: Fall;Sternal Precaution Booklet Issued: No Precaution Comments: NG tube, CRRT, continuous bladder irrigation Restrictions Weight Bearing Restrictions: Yes Other Position/Activity Restrictions: sternal precautions    Mobility  Bed Mobility Overal bed mobility: Needs Assistance Bed Mobility: Supine to Sit     Supine to sit: HOB elevated;+2 for physical assistance;Mod assist Sit to supine: Max assist;+2 for physical assistance   General bed mobility comments: Able to initiate moving LEs to EOB, assist with trunk and scooting bottom to EOB. Assist to bring LEs into bed and return to supine.  Transfers Overall transfer level: Needs assistance Equipment used: 2 person hand held  assist Transfers: Sit to/from Stand Sit to Stand: Mod assist;+2 physical assistance         General transfer comment: Assist of 2 to stand from EOB with use of momentum, stood from EOB x2.  Ambulation/Gait Ambulation/Gait assistance: +2 physical assistance;Mod assist Gait Distance (Feet): 5 Feet Assistive device: 2 person hand held assist Gait Pattern/deviations: Wide base of support;Shuffle Gait velocity: decreased   General Gait Details: Able to side step along side bed with Mod A of 2 for balance, weight shifting and support. 2/4 DOE. Bil knee instability noted.   Stairs             Wheelchair Mobility    Modified Rankin (Stroke Patients Only)       Balance Overall balance assessment: Needs assistance Sitting-balance support: Feet supported;No upper extremity supported Sitting balance-Leahy Scale: Fair Sitting balance - Comments: minG with BUE support of bed   Standing balance support: During functional activity Standing balance-Leahy Scale: Poor Standing balance comment: reliant on external support                            Cognition Arousal/Alertness: Awake/alert Behavior During Therapy: WFL for tasks assessed/performed Overall Cognitive Status: Within Functional Limits for tasks assessed                                 General Comments: Able to speak with PMV, had to remove for session due to drop in 02 sats.      Exercises  General Exercises - Lower Extremity Ankle Circles/Pumps: AROM;Both;20 reps Heel Slides: AROM;Both;10 reps    General Comments General comments (skin integrity, edema, etc.): Sp02 dropped to 85% on 40% Fi02 10L TC (with PMV donned), removed PMV and able to maintain sats >90%.      Pertinent Vitals/Pain Pain Assessment: No/denies pain    Home Living                      Prior Function            PT Goals (current goals can now be found in the care plan section) Progress towards PT  goals: Progressing toward goals    Frequency    Min 2X/week      PT Plan Current plan remains appropriate    Co-evaluation              AM-PAC PT "6 Clicks" Mobility   Outcome Measure  Help needed turning from your back to your side while in a flat bed without using bedrails?: A Lot Help needed moving from lying on your back to sitting on the side of a flat bed without using bedrails?: Total Help needed moving to and from a bed to a chair (including a wheelchair)?: A Lot Help needed standing up from a chair using your arms (e.g., wheelchair or bedside chair)?: A Lot Help needed to walk in hospital room?: A Lot Help needed climbing 3-5 steps with a railing? : Total 6 Click Score: 10    End of Session Equipment Utilized During Treatment: Oxygen(trach collar, 40% Fi02 10L) Activity Tolerance: Patient tolerated treatment well Patient left: in bed;with call bell/phone within reach;with SCD's reapplied;with family/visitor present Nurse Communication: Mobility status PT Visit Diagnosis: Other abnormalities of gait and mobility (R26.89);Muscle weakness (generalized) (M62.81)     Time: 3612-2449 PT Time Calculation (min) (ACUTE ONLY): 24 min  Charges:  $Therapeutic Activity: 23-37 mins                     Marisa Severin, PT, DPT Acute Rehabilitation Services Pager [phone] Office [phone]       Marguarite Arbour A Sabra Heck 04/24/2019, 3:25 PM

## 2019-04-24 NOTE — Progress Notes (Signed)
Nutrition Follow up   DOCUMENTATION CODES:   Not applicable  INTERVENTION:  Tube feeding:  -Jevity 1.5 @ 40 ml/hr via [name] -30 ml Prostat BID  Provides: 1640 kcals, 91 grams protein, 730 ml free water. Meets 75% of kcal needs and 67% protein needs. Rate decreased to promote appetite as pt complaining of fullness.   -Magic Cup TID -Nepro shake BID  NUTRITION DIAGNOSIS:   Increased nutrient needs related to post-op healing as evidenced by estimated needs.  Ongoing  GOAL:   Patient will meet greater than or equal to 90% of their needs   Addressed via tube feeding   MONITOR:   Weight trends, Diet advancement, Vent status, Skin, TF tolerance, Labs, I & O's  REASON FOR ASSESSMENT:   Consult Enteral/tube feeding initiation and management  ASSESSMENT:   Patient with PMH significant for CAD s/p PCI, prostate cancer, HTN, CHF, and DM. Presents this admission with chronic a.fib with RVR.   1/28- s/p L heart cath  1/30- s/p CABG x3, MAZE, extubated  2/2- re-intubated  2/5- extubated 2/8- re-intubated  2/10- s/p impella, trach, NGT placed-start trickle feeding 2/11- start CRRT 2/23- removal of impella, [name] dislodged  2/24- CRRT stop 3/1- failed iHD, back on CRRT 3/2- s/p [name], nocturnal feedings started 3/4- diet advanced DYS2 , nectar thick  3/8- s/p L PleurX catheter and L TDC  Remains on ATC, tolerating PMV. Off CRRT since yesterday. Cr and BUN trending up, plan for another trial of iHD tomorrow. Still requiring low dose pressors. Intake remains inadequate. Meal completions charted as 0-50% (25% average). Pt reports he often feels too full to eat meals. Plan to decrease tube feeding in hopes to promote appetite. If intake does not progress in near future may need to consider increase back to full feeds as he has developed new wounds.   Vital 1.5 changed to Osmolite 1.2 per TCTS due to ongoing diarrhea. Symptoms remain the same with TF switch. Will try formula  with more fiber.    Admission weight: 89.3 kg  Current weight: 83.2 kg (down 8 kg over the last week)  I/O:+24,169 ml since 2/23 UOP: 6,000 ml x 24 hrs   Drips: albumin, levohped Medications: arnesp, SS novolog, lantus Labs: Cr 3.11- trending up BUN 66- trending up Phosphorus 5.0 (H) CBG 102-158  Diet Order:   Diet Order            DIET DYS 2 Room service appropriate? Yes; Fluid consistency: Nectar Thick  Diet effective now              EDUCATION NEEDS:   Not appropriate for education at this time  Skin:  Skin Assessment: Skin Integrity Issues: Skin Integrity Issues:: Stage I, DTI, Stage II DTI: chest, R heel Stage I: vertebral column Stage II: buttocks Incisions: chest, leg  Last BM:  3/7  Height:   Ht Readings from Last 1 Encounters:  03/26/19 5\' 8"  (1.727 m)    Weight:   Wt Readings from Last 1 Encounters:  04/23/19 83.2 kg    Ideal Body Weight:  70 kg  BMI:  Body mass index is 27.89 kg/m.  Estimated Nutritional Needs:   Kcal:  2200-2400 kcal  Protein:  135-155 grams  Fluid:  >/= 2.2 L/day   Mariana Single RD, LDN Clinical Nutrition Pager # - [phone]

## 2019-04-25 ENCOUNTER — Inpatient Hospital Stay (HOSPITAL_COMMUNITY): Payer: Medicare Other

## 2019-04-25 LAB — RENAL FUNCTION PANEL
Albumin: 2.5 g/dL — ABNORMAL LOW (ref 3.5–5.0)
Anion gap: 14 (ref 5–15)
BUN: 102 mg/dL — ABNORMAL HIGH (ref 8–23)
CO2: 30 mmol/L (ref 22–32)
Calcium: 7.7 mg/dL — ABNORMAL LOW (ref 8.9–10.3)
Chloride: 89 mmol/L — ABNORMAL LOW (ref 98–111)
Creatinine, Ser: 4.34 mg/dL — ABNORMAL HIGH (ref 0.61–1.24)
GFR calc Af Amer: 13 mL/min — ABNORMAL LOW (ref >60)
GFR calc non Af Amer: 11 mL/min — ABNORMAL LOW (ref >60)
Glucose, Bld: 118 mg/dL — ABNORMAL HIGH (ref 70–99)
Phosphorus: 7 mg/dL — ABNORMAL HIGH (ref 2.5–4.6)
Potassium: 4.9 mmol/L (ref 3.5–5.1)
Sodium: 133 mmol/L — ABNORMAL LOW (ref 135–145)

## 2019-04-25 LAB — GLUCOSE, CAPILLARY
Glucose-Capillary: 105 mg/dL — ABNORMAL HIGH (ref 70–99)
Glucose-Capillary: 108 mg/dL — ABNORMAL HIGH (ref 70–99)
Glucose-Capillary: 115 mg/dL — ABNORMAL HIGH (ref 70–99)
Glucose-Capillary: 118 mg/dL — ABNORMAL HIGH (ref 70–99)
Glucose-Capillary: 126 mg/dL — ABNORMAL HIGH (ref 70–99)
Glucose-Capillary: 136 mg/dL — ABNORMAL HIGH (ref 70–99)

## 2019-04-25 LAB — PREPARE RBC (CROSSMATCH)

## 2019-04-25 LAB — CBC
HCT: 23.6 % — ABNORMAL LOW (ref 39.0–52.0)
Hemoglobin: 7.4 g/dL — ABNORMAL LOW (ref 13.0–17.0)
MCH: 29.4 pg (ref 26.0–34.0)
MCHC: 31.4 g/dL (ref 30.0–36.0)
MCV: 93.7 fL (ref 80.0–100.0)
Platelets: 145 10*3/uL — ABNORMAL LOW (ref 150–400)
RBC: 2.52 MIL/uL — ABNORMAL LOW (ref 4.22–5.81)
RDW: 16.3 % — ABNORMAL HIGH (ref 11.5–15.5)
WBC: 9.9 10*3/uL (ref 4.0–10.5)
nRBC: 0 % (ref 0.0–0.2)

## 2019-04-25 LAB — MAGNESIUM: Magnesium: 2.2 mg/dL (ref 1.7–2.4)

## 2019-04-25 MED ORDER — HEPARIN SODIUM (PORCINE) 1000 UNIT/ML DIALYSIS: 20.0000 [IU]/kg | INTRAMUSCULAR | Status: DC | PRN

## 2019-04-25 MED ORDER — AMIODARONE HCL 200 MG PO TABS
200.0000 mg | ORAL_TABLET | Freq: Every day | ORAL | Status: DC
  Administered 2019-04-25 – 2019-05-02 (×7): 200 mg via ORAL
  Filled 2019-04-25 (×8): qty 1

## 2019-04-25 MED ORDER — SODIUM CHLORIDE 0.9 % IV SOLN: 100.0000 mL | INTRAVENOUS | Status: DC | PRN

## 2019-04-25 MED ORDER — LIDOCAINE HCL (PF) 1 % IJ SOLN: 5.0000 mL | INTRAMUSCULAR | Status: DC | PRN

## 2019-04-25 MED ORDER — ALTEPLASE 2 MG IJ SOLR
2.0000 mg | Freq: Once | INTRAMUSCULAR | Status: DC | PRN
  Filled 2019-04-25: qty 2

## 2019-04-25 MED ORDER — HEPARIN SODIUM (PORCINE) 1000 UNIT/ML DIALYSIS
1000.0000 [IU] | INTRAMUSCULAR | Status: DC | PRN
  Administered 2019-04-25 – 2019-04-27 (×2): 3800 [IU] via INTRAVENOUS_CENTRAL

## 2019-04-25 MED ORDER — HEPARIN SODIUM (PORCINE) 1000 UNIT/ML IJ SOLN
INTRAMUSCULAR | Status: AC
  Administered 2019-04-25: 1700 [IU] via INTRAVENOUS_CENTRAL
  Filled 2019-04-25: qty 6

## 2019-04-25 MED ORDER — LIDOCAINE-PRILOCAINE 2.5-2.5 % EX CREA: 1.0000 "application " | TOPICAL_CREAM | CUTANEOUS | Status: DC | PRN

## 2019-04-25 MED ORDER — PENTAFLUOROPROP-TETRAFLUOROETH EX AERO
1.0000 "application " | INHALATION_SPRAY | CUTANEOUS | Status: DC | PRN
  Filled 2019-04-25: qty 116

## 2019-04-25 NOTE — Progress Notes (Signed)
Patient ID: Jesse Murphy, male   DOB: [birth year], 86 y.o.   MRN: [id number] EVENING ROUNDS NOTE :     [address]       [place] [address]             [phone]                 2 Days Post-Op Procedure(s) (LRB): INSERTION PLEURAL DRAINAGE CATHETER TO DRAIN LEFT PLEURAL EFFUSION (Left)  Total Length of Stay:  LOS: 42 days  BP (!) 91/38   Pulse 67   Temp 97.7 F (36.5 C) (Oral)   Resp 14   Ht 5\' 8"  (1.727 m)   Wt 91.3 kg   SpO2 96%   BMI 30.60 kg/m   .Intake/Output      03/09 0701 - 03/10 0700 03/10 0701 - 03/11 0700   P.O. 20 240   I.V. (mL/kg) 423.1 (5) 152.8 (1.7)   Blood     Other 18000 9000   NG/GT 770 440   IV Piggyback 72.9    Total Intake(mL/kg) 19286.1 (227.2) 9832.8 (107.7)   Urine (mL/kg/hr) 17400 (8.5) 11850 (11.1)   Other  2000   Stool  1   Blood     Chest Tube 100    Total Output 17500 13851   Net +1786.1 -4018.2          . sodium chloride    . sodium chloride    . sodium chloride    . feeding supplement (JEVITY 1.5 CAL/FIBER) 1,000 mL (04/25/19 1626)  . magnesium sulfate bolus IVPB    . norepinephrine (LEVOPHED) Adult infusion Stopped (04/24/19 1800)     Lab Results  Component Value Date   WBC 9.9 04/25/2019   HGB 7.4 (L) 04/25/2019   HCT 23.6 (L) 04/25/2019   PLT 145 (L) 04/25/2019   GLUCOSE 152 (H) 04/25/2019   CHOL 117 03/16/2019   TRIG 52 03/16/2019   HDL 33 (L) 03/16/2019   LDLCALC 74 03/16/2019   ALT 11 04/23/2019   AST 23 04/23/2019   NA 136 04/25/2019   K 3.7 04/25/2019   CL 95 (L) 04/25/2019   CREATININE 2.34 (H) 04/25/2019   BUN 36 (H) 04/25/2019   CO2 30 04/25/2019   TSH 3.683 03/13/2019   INR 1.7 (H) 03/27/2019   HGBA1C 6.1 (H) 03/13/2019   Stable day   Grace Isaac MD  Beeper [phone] Office [phone] 04/25/2019 6:40 PM

## 2019-04-25 NOTE — Progress Notes (Signed)
[facility] Progress Note    Assessment/ Plan:   1. AKI(Cr was 0.7 prior to surgery)- due to postoperative CHF/cardiogenic - started on CRRT on 03/29/19 - 2/24 -   off after clotting. Attempted IHD on 2/26 with minimal UF --> increased pressors and rapid afib.  Then CRRT on 3/1-- 3/8 at 0645. Had been onToleratingnet UF -CVP currently good- Off CRRT for procedures.   BUN and crt rising which indicates he will not be able to fly off of RRT.  I talked to him about doing intermittent HD yesterday which he refused citing he is too tired and in too much pain from procedures yesterday. Plan is for attempted IHD today via  [place] 2. Acute systolic CHF/cardiogenic shock- EF 25-30%; s/p Impella- now out. optimize volume as above. On midodrine  15 mg TID.  Now off pressors 3. CAD s/p [id number] 9/32/67 complicated by cardiogenic shock and ARF.Postop echo EF 25-30% 4. Acute hypoxic respiratory failure with pseudomonas pneumonia- s/p trach on 03/27/19.s/pabx per PCCM-> completed abx  5. Hematuria- bladder irrigationper urology recommendations-  Urine clearing up 6. ABLA- Hx Melena. Transfuse as needed. Have given RRBC's on 2/22 and 2/23. And 2/25. On aranesp 100 weekly- hgb down again  7. Protein malnutrition- moderate to severe 8. Secondary hyperparathyroidism/bone mineral - stopped therenvela. phos rising HD should correct.  pth 116 9. Disposition-  he is a poor longterm dialysis candidate given his cardiogenic shock and trach.  If trach not able to be removed or converted to VERY low maintenance would need LTACH  Subjective:   -  Unable to tell UOP due to CBI-  norepi weaned off-  sitting up in bedside chair - agreeable to dialysis today   Objective:   BP (!) 99/30   Pulse 79   Temp 98.8 F (37.1 C) (Oral)   Resp (!) 26   Ht _0  (1.727 m)   Wt 84.9 kg   SpO2 97%   BMI 28.46 kg/m   Intake/Output Summary (Last 24 hours) at 04/25/2019 0654 Last data filed at  04/25/2019 0600 Gross per 24 hour  Intake 13299.8 ml  Output 17400 ml  Net -4100.2 ml   Weight change:   Physical Exam: Gen: elderly male in bed chronically ill-appearing- alert, responsive HEENT trach in place; on vent  CVS: S1 S2 no rub Resp:clear;unlabored at rest; trach collar Abd: soft/ NT/ND  Ext:1-2+edema, in thighs as well Neuro - awake and interactive and follows commands, nods GU foley in place withirrigation, pretty dark but no clots noted Access:  tunneled HD cath left IJ  Imaging: IR Fluoro Guide CV Line Left  Result Date: 04/23/2019 INDICATION: 86 year old male with a history of renal failure EXAM: TUNNELED CENTRAL VENOUS HEMODIALYSIS CATHETER PLACEMENT WITH ULTRASOUND AND FLUOROSCOPIC GUIDANCE MEDICATIONS: 2 g Ancef. The antibiotic was given in an appropriate time interval prior to skin puncture. ANESTHESIA/SEDATION: Moderate (conscious) sedation was employed during this procedure. A total of Versed 1.0 mg and Fentanyl 50 mcg was administered intravenously. Moderate Sedation Time: 19 minutes. The patient's level of consciousness and vital signs were monitored continuously by radiology nursing throughout the procedure under my direct supervision. FLUOROSCOPY TIME:  Fluoroscopy Time: 0 minutes 24 seconds (1 mGy). COMPLICATIONS: None PROCEDURE: The procedure, risks, benefits, and alternatives were explained to the patient. Questions regarding the procedure were encouraged and answered. The patient understands and consents to the procedure. The left neck and chest was prepped with chlorhexidine, and draped in the usual sterile fashion using  maximum barrier technique (cap and mask, sterile gown, sterile gloves, large sterile sheet, hand hygiene and cutaneous antiseptic). Antibiotic prophylaxis was initiated with 2.0g Ancef administered IV within 1 hour prior to skin incision. Local anesthesia was attained by infiltration with 1% lidocaine without epinephrine. Ultrasound  demonstrated patency of the left IJ vein, and this was documented with an image. Under real-time ultrasound guidance, this vein was accessed with a 21 gauge micropuncture needle and image documentation was performed. A small dermatotomy was made at the access site with an 11 scalpel. A 0.018" wire was advanced into the SVC and the access needle exchanged for a 71F micropuncture vascular sheath. The 0.018" wire was then removed and a 0.035" wire advanced into the IVC. Upon withdrawal of the 018 wire, the wire was marked for appropriate length of the internal portion of the catheter. A 23 cm tip to cuff split tip catheter was selected. The skin and subcutaneous tissues of the left chest wall were then generously infiltrated with 1% lidocaine without epinephrine from the chest wall to the puncture site at the left IJ. The hemodialysis catheter was then back tunneled from the right chest wall incision to the dermatotomy at the left neck. The venous access site was then serially dilated and a peel away vascular sheath placed over the wire. The wire was removed and the catheter was placed through the peel-away sheath. The catheter tip is positioned in the upper right atrium. This was documented with a spot image. Both ports of the hemodialysis catheter were then tested for excellent function. The ports were then locked with heparinized lock. The incision at the neck was then sealed with Dermabond. Patient tolerated the procedure well and remained hemodynamically stable throughout. No complications were encountered and no significant blood loss was encountered. IMPRESSION: Status post left IJ tunneled hemodialysis catheter. Catheter ready for use. Signed, Dulcy Fanny. Dellia Nims, RPVI [facility] Electronically Signed   By: Corrie Mckusick D.O.   On: 04/23/2019 10:53   IR US Guide Vasc Access Left  Result Date: 04/23/2019 INDICATION: 86 year old male with a history of  renal failure EXAM: TUNNELED CENTRAL VENOUS HEMODIALYSIS CATHETER PLACEMENT WITH ULTRASOUND AND FLUOROSCOPIC GUIDANCE MEDICATIONS: 2 g Ancef. The antibiotic was given in an appropriate time interval prior to skin puncture. ANESTHESIA/SEDATION: Moderate (conscious) sedation was employed during this procedure. A total of Versed 1.0 mg and Fentanyl 50 mcg was administered intravenously. Moderate Sedation Time: 19 minutes. The patient's level of consciousness and vital signs were monitored continuously by radiology nursing throughout the procedure under my direct supervision. FLUOROSCOPY TIME:  Fluoroscopy Time: 0 minutes 24 seconds (1 mGy). COMPLICATIONS: None PROCEDURE: The procedure, risks, benefits, and alternatives were explained to the patient. Questions regarding the procedure were encouraged and answered. The patient understands and consents to the procedure. The left neck and chest was prepped with chlorhexidine, and draped in the usual sterile fashion using maximum barrier technique (cap and mask, sterile gown, sterile gloves, large sterile sheet, hand hygiene and cutaneous antiseptic). Antibiotic prophylaxis was initiated with 2.0g Ancef administered IV within 1 hour prior to skin incision. Local anesthesia was attained by infiltration with 1% lidocaine without epinephrine. Ultrasound demonstrated patency of the left IJ vein, and this was documented with an image. Under real-time ultrasound guidance, this vein was accessed with a 21 gauge micropuncture needle and image documentation was performed. A small dermatotomy was made at the access site with an 11 scalpel. A 0.018" wire was advanced  into the SVC and the access needle exchanged for a 38F micropuncture vascular sheath. The 0.018" wire was then removed and a 0.035" wire advanced into the IVC. Upon withdrawal of the 018 wire, the wire was marked for appropriate length of the internal portion of the catheter. A 23 cm tip to cuff split tip catheter was  selected. The skin and subcutaneous tissues of the left chest wall were then generously infiltrated with 1% lidocaine without epinephrine from the chest wall to the puncture site at the left IJ. The hemodialysis catheter was then back tunneled from the right chest wall incision to the dermatotomy at the left neck. The venous access site was then serially dilated and a peel away vascular sheath placed over the wire. The wire was removed and the catheter was placed through the peel-away sheath. The catheter tip is positioned in the upper right atrium. This was documented with a spot image. Both ports of the hemodialysis catheter were then tested for excellent function. The ports were then locked with heparinized lock. The incision at the neck was then sealed with Dermabond. Patient tolerated the procedure well and remained hemodynamically stable throughout. No complications were encountered and no significant blood loss was encountered. IMPRESSION: Status post left IJ tunneled hemodialysis catheter. Catheter ready for use. Signed, Dulcy Fanny. Dellia Nims, RPVI [facility] Electronically Signed   By: Corrie Mckusick D.O.   On: 04/23/2019 10:53   DG Chest Port 1 View  Result Date: 04/24/2019 CLINICAL DATA:  Respiratory failure.  CABG. EXAM: PORTABLE CHEST 1 VIEW COMPARISON:  Yesterday FINDINGS: Stable heart size. Prior CABG and left atrial clipping. Bilateral central line with tips in good position. Tracheostomy tube in place. Unchanged hazy opacification at the bases from atelectasis and pleural fluid by recent CT. No visible pneumothorax. No change in interstitial markings. IMPRESSION: Stable hardware positioning and hazy chest opacification that correlates with atelectasis and pleural fluid on most recent chest CT. Electronically Signed   By: Monte Fantasia M.D.   On: 04/24/2019 08:46   DG Chest Portable 1 View  Result Date: 04/23/2019 CLINICAL DATA:  Status  post pleural drainage catheter for left pleural effusion. EXAM: PORTABLE CHEST 1 VIEW COMPARISON:  04/22/2019 FINDINGS: Tracheostomy. Dialysis catheter with tip at low SVC or cavoatrial junction. Left atrial occlusion device. Prior median sternotomy. The left-sided drainage catheter projects over the mid chest. Decrease in left pleural effusion. Possible tiny left apical pneumothorax, less than 5%. Right PICC line tip difficult to visualize centrally. Mild cardiomegaly. Mild interstitial edema. Left greater than right base atelectasis. IMPRESSION: Placement of a left-sided pleural drainage catheter with. Decreased left pleural effusion. Possible tiny left apical pneumothorax. Congestive heart failure with bibasilar atelectasis Electronically Signed   By: Abigail Miyamoto M.D.   On: 04/23/2019 12:46   DG C-Arm 1-60 Min-No Report  Result Date: 04/23/2019 Fluoroscopy was utilized by the requesting physician.  No radiographic interpretation.    Labs: BMET Recent Labs  Lab 04/22/19 0500 04/22/19 0501 04/22/19 1612 04/22/19 2027 04/23/19 0441 04/23/19 0442 04/23/19 0449 04/23/19 1827 04/24/19 0456 04/24/19 1521 04/25/19 0454  NA 139   < > 136   < > 138 136 132* 133* 135 135 133*  K 4.0   < > 4.2   < > 4.0 4.2 3.9 4.5 4.7 4.8 4.9  CL 92*   < > 91*   < > 89* 90* 86* 89* 91* 90* 89*  CO2 36*  --  35*  --   --  33*  --  32 _0 GLUCOSE 132*   < > 172*   < > 144* 193* 209* 212* 187* 96 118*  BUN 29*   < > 26*   < > 22 26* 25* 43* 66* 83* 102*  CREATININE 1.61*   < > 1.48*   < > 1.20 1.50* 1.40* 2.37* 3.11* 3.84* 4.34*  CALCIUM 7.0*  --  9.4  --   --  10.1  --  6.8* 7.1* 7.8* 7.7*  PHOS 1.4*  --  1.6*  --   --  2.1*  --  4.1 5.0* 6.1* 7.0*   < > = values in this interval not displayed.   CBC Recent Labs  Lab 04/19/19 0401 04/19/19 0402 04/23/19 0442 04/23/19 0442 04/23/19 0449 04/23/19 1827 04/24/19 0456 04/25/19 0454  WBC 11.8*   < > 10.5  --   --  9.9 9.9 9.9  NEUTROABS 9.2*  --    --   --   --   --   --   --   HGB 8.5*   < > 7.1*   < > 7.5* 8.6* 8.2* 7.4*  HCT 28.1*   < > 24.0*   < > 22.0* 27.2* 26.5* 23.6*  MCV 96.6   < > 97.6  --   --  94.8 94.3 93.7  PLT 255   < > 219  --   --  204 190 145*   < > = values in this interval not displayed.    Medications:    . aspirin  81 mg Oral Daily  . atorvastatin  80 mg Oral q1800  . chlorhexidine gluconate (MEDLINE KIT)  15 mL Mouth Rinse BID  . Chlorhexidine Gluconate Cloth  6 each Topical Q0600  . ciprofloxacin  500 mg Oral Q24H  . darbepoetin (ARANESP) injection - NON-DIALYSIS  100 mcg Subcutaneous Q Tue-1800  . feeding supplement (NEPRO CARB STEADY)  237 mL Oral BID BM  . feeding supplement (PRO-STAT SUGAR FREE 64)  30 mL Per Tube BID  . Gerhardt's butt cream   Topical BID  . hydrocortisone   Rectal BID  . insulin aspart  0-24 Units Subcutaneous Q4H  . insulin glargine  36 Units Subcutaneous Daily  . mouth rinse  15 mL Mouth Rinse 10 times per day  . midodrine  15 mg Oral TID WC  . multivitamin  1 tablet Oral QHS  . pantoprazole (PROTONIX) IV  40 mg Intravenous Q24H  . simethicone  80 mg Oral BID PC  . sodium chloride flush  10-40 mL Intracatheter Q12H      Aurie Harroun A Sumayah Bearse  04/25/2019, 6:54 AM

## 2019-04-25 NOTE — Progress Notes (Signed)
Patient ID: Jesse Murphy, male   DOB: 08-13-1928, 86 y.o.   MRN: [id number] f    Advanced Heart Failure Rounding Note  PCP-Cardiologist: Kate Sable, MD   Subjective:    Impella removed 2/23. Intraoperative TEE showed EF ~40%  Had tunneled cath placed 3/9 and left PleurX tube.   Norepinephrine weaned off.  He remains on midodrine 15 3 times daily.  Blood pressure in the 90s.  He was able to tolerate intermittent hemodialysis today without pressor support.  This evening he remains nauseated and weak.  He does not appear to be making any urine although assessment is limited by his CBI.  Objective:   Weight Range: 91.3 kg Body mass index is 30.6 kg/m.   Vital Signs:   Temp:  [97.4 F (36.3 C)-98.8 F (37.1 C)] 98.4 F (36.9 C) (03/10 1900) Pulse Rate:  [65-79] 67 (03/10 2030) Resp:  [12-30] 13 (03/10 2030) BP: (78-117)/(30-50) 99/35 (03/10 2030) SpO2:  [88 %-100 %] 98 % (03/10 2030) FiO2 (%):  [35 %-40 %] 35 % (03/10 1921) Weight:  [84.9 kg-91.3 kg] 91.3 kg (03/10 1100) Last BM Date: 04/25/19  Weight change: Filed Weights   04/23/19 0700 04/25/19 0500 04/25/19 1100  Weight: 83.2 kg 84.9 kg 91.3 kg    Intake/Output:   Intake/Output Summary (Last 24 hours) at 04/25/2019 2211 Last data filed at 04/25/2019 2148 Gross per 24 hour  Intake 22439.15 ml  Output 24501 ml  Net -2061.85 ml      Physical Exam   General:  Lying in bed. Weak nauseated HEENT: normal Neck: supple. no JVD.  + trach collarCarotids 2+ bilat; no bruits. No lymphadenopathy or thryomegaly appreciated. Cor: PMI nondisplaced. Regular rate & rhythm. No rubs, gallops or murmurs. Lungs: coarse Abdomen: soft, nontender, nondistended. No hepatosplenomegaly. No bruits or masses. Good bowel sounds. Extremities: no cyanosis, clubbing, rash, 1+ edema Neuro: awake on TC moves all 4   Telemetry   NSR 60-70s personally reviewed   Labs    CBC Recent Labs    04/24/19 0456 04/25/19 0454  WBC 9.9  9.9  HGB 8.2* 7.4*  HCT 26.5* 23.6*  MCV 94.3 93.7  PLT 190 630*   Basic Metabolic Panel Recent Labs    04/24/19 0456 04/24/19 1521 04/25/19 0454 04/25/19 1615  NA 135   < > 133* 136  K 4.7   < > 4.9 3.7  CL 91*   < > 89* 95*  CO2 31   < > 30 30  GLUCOSE 187*   < > 118* 152*  BUN 66*   < > 102* 36*  CREATININE 3.11*   < > 4.34* 2.34*  CALCIUM 7.1*   < > 7.7* 8.1*  MG 2.0  --  2.2  --   PHOS 5.0*   < > 7.0* 3.7   < > = values in this interval not displayed.   Liver Function Tests Recent Labs    04/23/19 0442 04/23/19 1827 04/25/19 0454 04/25/19 1615  AST 23  --   --   --   ALT 11  --   --   --   ALKPHOS 65  --   --   --   BILITOT 0.4  --   --   --   PROT 5.5*  --   --   --   ALBUMIN 2.5*   < > 2.5* 2.5*   < > = values in this interval not displayed.   No results for input(s): LIPASE,  AMYLASE in the last 72 hours. Cardiac Enzymes No results for input(s): CKTOTAL, CKMB, CKMBINDEX, TROPONINI in the last 72 hours.  BNP: BNP (last 3 results) Recent Labs    03/22/19 0401 03/26/19 0626 03/27/19 1255  BNP 340.4* 687.5* 800.4*    ProBNP (last 3 results) No results for input(s): PROBNP in the last 8760 hours.   D-Dimer No results for input(s): DDIMER in the last 72 hours. Hemoglobin A1C No results for input(s): HGBA1C in the last 72 hours. Fasting Lipid Panel No results for input(s): CHOL, HDL, LDLCALC, TRIG, CHOLHDL, LDLDIRECT in the last 72 hours. Thyroid Function Tests No results for input(s): TSH, T4TOTAL, T3FREE, THYROIDAB in the last 72 hours.  Invalid input(s): FREET3  Other results:   Imaging    DG Chest Port 1 View  Result Date: 04/25/2019 CLINICAL DATA:  History of CABG. EXAM: PORTABLE CHEST 1 VIEW COMPARISON:  Yesterday FINDINGS: Tracheostomy tube in place. Left-sided dialysis catheter and right PICC with tips at the SVC. Feeding tube at least reaches the stomach. Haziness of the bilateral lower chest from atelectasis and pleural fluid. A  tunneled pleural catheter is seen on the left. Diffuse interstitial coarsening. IMPRESSION: Stable hardware positioning and layering pleural effusions. Vascular congestion/mild edema. Electronically Signed   By: Monte Fantasia M.D.   On: 04/25/2019 08:52     Medications:     Scheduled Medications: . amiodarone  200 mg Oral Daily  . aspirin  81 mg Oral Daily  . atorvastatin  80 mg Oral q1800  . chlorhexidine gluconate (MEDLINE KIT)  15 mL Mouth Rinse BID  . Chlorhexidine Gluconate Cloth  6 each Topical Q0600  . ciprofloxacin  500 mg Oral Q24H  . darbepoetin (ARANESP) injection - NON-DIALYSIS  100 mcg Subcutaneous Q Tue-1800  . feeding supplement (NEPRO CARB STEADY)  237 mL Oral BID BM  . feeding supplement (PRO-STAT SUGAR FREE 64)  30 mL Per Tube BID  . Gerhardt's butt cream   Topical BID  . hydrocortisone   Rectal BID  . insulin aspart  0-24 Units Subcutaneous Q4H  . insulin glargine  36 Units Subcutaneous Daily  . mouth rinse  15 mL Mouth Rinse 10 times per day  . midodrine  15 mg Oral TID WC  . multivitamin  1 tablet Oral QHS  . pantoprazole (PROTONIX) IV  40 mg Intravenous Q24H  . simethicone  80 mg Oral BID PC  . sodium chloride flush  10-40 mL Intracatheter Q12H    Infusions: . sodium chloride    . sodium chloride    . sodium chloride    . feeding supplement (JEVITY 1.5 CAL/FIBER) 1,000 mL (04/25/19 1626)  . magnesium sulfate bolus IVPB    . norepinephrine (LEVOPHED) Adult infusion Stopped (04/24/19 1800)    PRN Medications: sodium chloride, sodium chloride, sodium chloride, oxyCODONE **AND** acetaminophen, alteplase, dextrose, heparin, heparin, hyoscyamine, levalbuterol, lidocaine (PF), lidocaine-prilocaine, loperamide HCl, ondansetron (ZOFRAN) IV, pentafluoroprop-tetrafluoroeth, promethazine, Resource ThickenUp Clear, sodium chloride flush, sodium chloride flush    Assessment/Plan   1. Acute systolic HF -> Cardiogenic shock - post-op echo on 03/20/19 EF 25-30%  (pre-op 30-35%. In 12/2017 EF normal) - Required Impella support post CABG. Impella removed 2/23 - failed iHD on 3/1.  - He was able to tolerate iHD today off pressor support.  However he remains very weak and blood pressure is quite soft currently.  I am not sure if he will be able to tolerate it long-term.  2. CAD s/p CABG this admit (LIMA->LAD,  SVG->OM, SVG->RCA on 03/15/19) - No signs or symptoms of ischemia - Continue ASA +statin  3. Acute hypoxic respiratory failure with Pseudomonas PNA - s/p trach on 03/27/19 - completed meropenem for pseudomonas PNA - trach aspirate now growing Pseudomonas again.   - now on cipro.  We will continue for a total of 14 days.  Discussed with Pharm.D. -Pains on trach collar.  4. PAF - s/p MAZE and LAA occlusion - Has been in/out AF. Now in NSR -We will switch to oral amiodarone now that he is off pressors.  5. AKI  - due to shock/ATN - On CVVHD. Making minimal urine - Tunneled cath placed 3/9  -He tolerated intermittent hemodialysis today.  However blood pressure remains very soft.  Hopefully he can tolerate long-term dialysis but if fails to if not then would seem to be palliative situation    6. Hematuria -Has recurred.  Back on CBI. Continue  7. Melena - Resolved  8. Anemia - Hgb 8.2-> 7.4 Transfuse to keep hgb >= 7.5    9. Debility, severe - continue PT  10 UE swelling - u/s negative for DVT.  - Continue wraps.   11. F/E/N - Tube feeds per Cor-trak  12. Left Pleural Effusion  - moderate to large. - s/p PleurX on 3/8.  -Chest x-ray improved.  Reviewed personally.  Overall remains very tenuous.  He is able to tolerate remittent hemodialysis today for the first time off pressors.  However he is now nauseated and blood pressure is low.  I am worried he will be unable tolerate this long-term.  We will follow closely.  Continue to try and mobilize.  Appreciate nephrology support.    CRITICAL CARE Performed by: Glori Bickers  Total critical care time: 35 minutes  Critical care time was exclusive of separately billable procedures and treating other patients.  Critical care was necessary to treat or prevent imminent or life-threatening deterioration.  Critical care was time spent personally by me (independent of midlevel providers or residents) on the following activities: development of treatment plan with patient and/or surrogate as well as nursing, discussions with consultants, evaluation of patient's response to treatment, examination of patient, obtaining history from patient or surrogate, ordering and performing treatments and interventions, ordering and review of laboratory studies, ordering and review of radiographic studies, pulse oximetry and re-evaluation of patient's condition.     Glori Bickers, MD  10:11 PM

## 2019-04-25 NOTE — Progress Notes (Signed)
[address]       [place] [address]             [phone]      2 Days Post-Op Procedure(s) (LRB): INSERTION PLEURAL DRAINAGE CATHETER TO DRAIN LEFT PLEURAL EFFUSION (Left) Subjective: C/o chair being uncomfortable Frequent nausea  Objective: Vital signs in last 24 hours: Temp:  [97.7 F (36.5 C)-98.8 F (37.1 C)] 98.8 F (37.1 C) (03/10 0400) Pulse Rate:  [66-82] 79 (03/10 0600) Cardiac Rhythm: Normal sinus rhythm (03/10 0400) Resp:  [14-26] 26 (03/10 0600) BP: (81-118)/(30-70) 99/30 (03/10 0600) SpO2:  [88 %-100 %] 97 % (03/10 0600) FiO2 (%):  [35 %-40 %] 40 % (03/10 0419) Weight:  [84.9 kg] 84.9 kg (03/10 0500)  Hemodynamic parameters for last 24 hours: CVP:  [5 mmHg-7 mmHg] 6 mmHg  Intake/Output from previous day: 03/09 0701 - 03/10 0700 In: 19286.1 [P.O.:20; I.V.:423.1; NG/GT:770; IV Piggyback:72.9] Out: 17500 [Urine:17400; Chest Tube:100] Intake/Output this shift: Total I/O In: -  Out: 1650 [Urine:1650]  General appearance: alert, cooperative and no distress Heart: regular rate and rhythm Lungs: mild coarseness throughout Abdomen: soft, nontender Extremities: no signif edema Wound: incis healing well  Lab Results: Recent Labs    04/24/19 0456 04/25/19 0454  WBC 9.9 9.9  HGB 8.2* 7.4*  HCT 26.5* 23.6*  PLT 190 145*   BMET:  Recent Labs    04/24/19 1521 04/25/19 0454  NA 135 133*  K 4.8 4.9  CL 90* 89*  CO2 30 30  GLUCOSE 96 118*  BUN 83* 102*  CREATININE 3.84* 4.34*  CALCIUM 7.8* 7.7*    PT/INR: No results for input(s): LABPROT, INR in the last 72 hours. ABG    Component Value Date/Time   PHART 7.488 (H) 04/07/2019 0405   HCO3 25.9 04/07/2019 0405   TCO2 36 (H) 04/23/2019 0449   ACIDBASEDEF 1.0 04/05/2019 0540   O2SAT 67.8 04/14/2019 1253   CBG (last 3)  Recent Labs    04/25/19 0033 04/25/19 0451 04/25/19 0721  GLUCAP 118* 108* 126*    Meds Scheduled Meds: . aspirin  81 mg Oral Daily  .  atorvastatin  80 mg Oral q1800  . chlorhexidine gluconate (MEDLINE KIT)  15 mL Mouth Rinse BID  . Chlorhexidine Gluconate Cloth  6 each Topical Q0600  . ciprofloxacin  500 mg Oral Q24H  . darbepoetin (ARANESP) injection - NON-DIALYSIS  100 mcg Subcutaneous Q Tue-1800  . feeding supplement (NEPRO CARB STEADY)  237 mL Oral BID BM  . feeding supplement (PRO-STAT SUGAR FREE 64)  30 mL Per Tube BID  . Gerhardt's butt cream   Topical BID  . hydrocortisone   Rectal BID  . insulin aspart  0-24 Units Subcutaneous Q4H  . insulin glargine  36 Units Subcutaneous Daily  . mouth rinse  15 mL Mouth Rinse 10 times per day  . midodrine  15 mg Oral TID WC  . multivitamin  1 tablet Oral QHS  . pantoprazole (PROTONIX) IV  40 mg Intravenous Q24H  . simethicone  80 mg Oral BID PC  . sodium chloride flush  10-40 mL Intracatheter Q12H   Continuous Infusions: . sodium chloride    . sodium chloride    . sodium chloride    . amiodarone 30 mg/hr (04/25/19 0700)  . calcium gluconate infusion for CRRT Stopped (04/23/19 0700)  . feeding supplement (JEVITY 1.5 CAL/FIBER) 1,000 mL (04/24/19 1542)  . magnesium sulfate bolus IVPB    .  norepinephrine (LEVOPHED) Adult infusion Stopped (04/24/19 1800)  . prismasol B22GK 4/0 300 mL/hr at 04/22/19 2204  . prismasol B22GK 4/0 1,500 mL/hr at 04/23/19 0500  . sodium citrate 2 %/dextrose 2.5% solution 3000 mL 300 mL/hr at 04/23/19 0221   PRN Meds:.sodium chloride, sodium chloride, sodium chloride, oxyCODONE **AND** acetaminophen, dextrose, fentaNYL (SUBLIMAZE) injection, hyoscyamine, levalbuterol, lidocaine (PF), lidocaine-prilocaine, loperamide HCl, ondansetron (ZOFRAN) IV, promethazine, Resource ThickenUp Clear, sodium chloride, sodium chloride flush, sodium chloride flush, sodium chloride flush  Xrays IR Fluoro Guide CV Line Left  Result Date: 04/23/2019 INDICATION: 86 year old male with a history of renal failure EXAM: TUNNELED CENTRAL VENOUS HEMODIALYSIS CATHETER  PLACEMENT WITH ULTRASOUND AND FLUOROSCOPIC GUIDANCE MEDICATIONS: 2 g Ancef. The antibiotic was given in an appropriate time interval prior to skin puncture. ANESTHESIA/SEDATION: Moderate (conscious) sedation was employed during this procedure. A total of Versed 1.0 mg and Fentanyl 50 mcg was administered intravenously. Moderate Sedation Time: 19 minutes. The patient's level of consciousness and vital signs were monitored continuously by radiology nursing throughout the procedure under my direct supervision. FLUOROSCOPY TIME:  Fluoroscopy Time: 0 minutes 24 seconds (1 mGy). COMPLICATIONS: None PROCEDURE: The procedure, risks, benefits, and alternatives were explained to the patient. Questions regarding the procedure were encouraged and answered. The patient understands and consents to the procedure. The left neck and chest was prepped with chlorhexidine, and draped in the usual sterile fashion using maximum barrier technique (cap and mask, sterile gown, sterile gloves, large sterile sheet, hand hygiene and cutaneous antiseptic). Antibiotic prophylaxis was initiated with 2.0g Ancef administered IV within 1 hour prior to skin incision. Local anesthesia was attained by infiltration with 1% lidocaine without epinephrine. Ultrasound demonstrated patency of the left IJ vein, and this was documented with an image. Under real-time ultrasound guidance, this vein was accessed with a 21 gauge micropuncture needle and image documentation was performed. A small dermatotomy was made at the access site with an 11 scalpel. A 0.018" wire was advanced into the SVC and the access needle exchanged for a 105F micropuncture vascular sheath. The 0.018" wire was then removed and a 0.035" wire advanced into the IVC. Upon withdrawal of the 018 wire, the wire was marked for appropriate length of the internal portion of the catheter. A 23 cm tip to cuff split tip catheter was selected. The skin and subcutaneous tissues of the left chest wall were  then generously infiltrated with 1% lidocaine without epinephrine from the chest wall to the puncture site at the left IJ. The hemodialysis catheter was then back tunneled from the right chest wall incision to the dermatotomy at the left neck. The venous access site was then serially dilated and a peel away vascular sheath placed over the wire. The wire was removed and the catheter was placed through the peel-away sheath. The catheter tip is positioned in the upper right atrium. This was documented with a spot image. Both ports of the hemodialysis catheter were then tested for excellent function. The ports were then locked with heparinized lock. The incision at the neck was then sealed with Dermabond. Patient tolerated the procedure well and remained hemodynamically stable throughout. No complications were encountered and no significant blood loss was encountered. IMPRESSION: Status post left IJ tunneled hemodialysis catheter. Catheter ready for use. Signed, Dulcy Fanny. Dellia Nims, RPVI [facility] Radiology Electronically Signed   By: Corrie Mckusick D.O.   On: 04/23/2019 10:53   IR US Guide Vasc Access Left  Result Date: 04/23/2019 INDICATION: 86 year old  male with a history of renal failure EXAM: TUNNELED CENTRAL VENOUS HEMODIALYSIS CATHETER PLACEMENT WITH ULTRASOUND AND FLUOROSCOPIC GUIDANCE MEDICATIONS: 2 g Ancef. The antibiotic was given in an appropriate time interval prior to skin puncture. ANESTHESIA/SEDATION: Moderate (conscious) sedation was employed during this procedure. A total of Versed 1.0 mg and Fentanyl 50 mcg was administered intravenously. Moderate Sedation Time: 19 minutes. The patient's level of consciousness and vital signs were monitored continuously by radiology nursing throughout the procedure under my direct supervision. FLUOROSCOPY TIME:  Fluoroscopy Time: 0 minutes 24 seconds (1 mGy). COMPLICATIONS: None PROCEDURE: The procedure, risks,  benefits, and alternatives were explained to the patient. Questions regarding the procedure were encouraged and answered. The patient understands and consents to the procedure. The left neck and chest was prepped with chlorhexidine, and draped in the usual sterile fashion using maximum barrier technique (cap and mask, sterile gown, sterile gloves, large sterile sheet, hand hygiene and cutaneous antiseptic). Antibiotic prophylaxis was initiated with 2.0g Ancef administered IV within 1 hour prior to skin incision. Local anesthesia was attained by infiltration with 1% lidocaine without epinephrine. Ultrasound demonstrated patency of the left IJ vein, and this was documented with an image. Under real-time ultrasound guidance, this vein was accessed with a 21 gauge micropuncture needle and image documentation was performed. A small dermatotomy was made at the access site with an 11 scalpel. A 0.018" wire was advanced into the SVC and the access needle exchanged for a 42F micropuncture vascular sheath. The 0.018" wire was then removed and a 0.035" wire advanced into the IVC. Upon withdrawal of the 018 wire, the wire was marked for appropriate length of the internal portion of the catheter. A 23 cm tip to cuff split tip catheter was selected. The skin and subcutaneous tissues of the left chest wall were then generously infiltrated with 1% lidocaine without epinephrine from the chest wall to the puncture site at the left IJ. The hemodialysis catheter was then back tunneled from the right chest wall incision to the dermatotomy at the left neck. The venous access site was then serially dilated and a peel away vascular sheath placed over the wire. The wire was removed and the catheter was placed through the peel-away sheath. The catheter tip is positioned in the upper right atrium. This was documented with a spot image. Both ports of the hemodialysis catheter were then tested for excellent function. The ports were then locked  with heparinized lock. The incision at the neck was then sealed with Dermabond. Patient tolerated the procedure well and remained hemodynamically stable throughout. No complications were encountered and no significant blood loss was encountered. IMPRESSION: Status post left IJ tunneled hemodialysis catheter. Catheter ready for use. Signed, Dulcy Fanny. Dellia Nims, RPVI [facility] Inter[facility] Radiology Specialists [facility] Radiology Electronically Signed   By: Corrie Mckusick D.O.   On: 04/23/2019 10:53   DG Chest Port 1 View  Result Date: 04/24/2019 CLINICAL DATA:  Respiratory failure.  CABG. EXAM: PORTABLE CHEST 1 VIEW COMPARISON:  Yesterday FINDINGS: Stable heart size. Prior CABG and left atrial clipping. Bilateral central line with tips in good position. Tracheostomy tube in place. Unchanged hazy opacification at the bases from atelectasis and pleural fluid by recent CT. No visible pneumothorax. No change in interstitial markings. IMPRESSION: Stable hardware positioning and hazy chest opacification that correlates with atelectasis and pleural fluid on most recent chest CT. Electronically Signed   By: Monte Fantasia M.D.   On: 04/24/2019 08:46   DG Chest Portable 1 View  Result Date: 04/23/2019 CLINICAL DATA:  Status post pleural drainage catheter for left pleural effusion. EXAM: PORTABLE CHEST 1 VIEW COMPARISON:  04/22/2019 FINDINGS: Tracheostomy. Dialysis catheter with tip at low SVC or cavoatrial junction. Left atrial occlusion device. Prior median sternotomy. The left-sided drainage catheter projects over the mid chest. Decrease in left pleural effusion. Possible tiny left apical pneumothorax, less than 5%. Right PICC line tip difficult to visualize centrally. Mild cardiomegaly. Mild interstitial edema. Left greater than right base atelectasis. IMPRESSION: Placement of a left-sided pleural drainage catheter with. Decreased left pleural effusion. Possible tiny left apical pneumothorax. Congestive heart  failure with bibasilar atelectasis Electronically Signed   By: Abigail Miyamoto M.D.   On: 04/23/2019 12:46   DG C-Arm 1-60 Min-No Report  Result Date: 04/23/2019 Fluoroscopy was utilized by the requesting physician.  No radiographic interpretation.    Assessment/Plan: S/P Procedure(s) (LRB): INSERTION PLEURAL DRAINAGE CATHETER TO DRAIN LEFT PLEURAL EFFUSION (Left)  1 conts with steady progress 2 for HD today 3 tol TC, cont to downsize as able 4 off pressors 5 conts IV amio 6 push rehab as able- will get OT to see as well 7 H/H close to trensfusion threshold- cont to monitor, conts CBI for hematuria  LOS: 42 days    John Giovanni PA-C Pager [phone] 04/25/2019

## 2019-04-25 NOTE — Plan of Care (Signed)
  Problem: Clinical Measurements: Goal: Ability to maintain clinical measurements within normal limits will improve Outcome: Progressing Goal: Respiratory complications will improve Outcome: Progressing Note: Maintaining appropriate O2 sats while on trach collar.   Problem: Activity: Goal: Risk for activity intolerance will decrease Outcome: Progressing   Problem: Nutrition: Goal: Adequate nutrition will be maintained Outcome: Not Progressing Note: Still c/o nausea with PO intake.   Problem: Elimination: Goal: Will not experience complications related to bowel motility Outcome: Completed/Met

## 2019-04-26 ENCOUNTER — Other Ambulatory Visit: Payer: Self-pay | Admitting: Urology

## 2019-04-26 ENCOUNTER — Inpatient Hospital Stay (HOSPITAL_COMMUNITY): Payer: Medicare Other

## 2019-04-26 LAB — RENAL FUNCTION PANEL
Albumin: 2.5 g/dL — ABNORMAL LOW (ref 3.5–5.0)
Albumin: 2.3 g/dL — ABNORMAL LOW (ref 3.5–5.0)
Albumin: 2.3 g/dL — ABNORMAL LOW (ref 3.5–5.0)
Anion gap: 11 (ref 5–15)
Anion gap: 13 (ref 5–15)
Anion gap: 12 (ref 5–15)
BUN: 36 mg/dL — ABNORMAL HIGH (ref 8–23)
BUN: 53 mg/dL — ABNORMAL HIGH (ref 8–23)
BUN: 69 mg/dL — ABNORMAL HIGH (ref 8–23)
CO2: 30 mmol/L (ref 22–32)
CO2: 25 mmol/L (ref 22–32)
CO2: 27 mmol/L (ref 22–32)
Calcium: 8.1 mg/dL — ABNORMAL LOW (ref 8.9–10.3)
Calcium: 8 mg/dL — ABNORMAL LOW (ref 8.9–10.3)
Calcium: 8.1 mg/dL — ABNORMAL LOW (ref 8.9–10.3)
Chloride: 95 mmol/L — ABNORMAL LOW (ref 98–111)
Chloride: 97 mmol/L — ABNORMAL LOW (ref 98–111)
Chloride: 97 mmol/L — ABNORMAL LOW (ref 98–111)
Creatinine, Ser: 2.34 mg/dL — ABNORMAL HIGH (ref 0.61–1.24)
Creatinine, Ser: 3.13 mg/dL — ABNORMAL HIGH (ref 0.61–1.24)
Creatinine, Ser: 3.99 mg/dL — ABNORMAL HIGH (ref 0.61–1.24)
GFR calc Af Amer: 28 mL/min — ABNORMAL LOW
GFR calc Af Amer: 20 mL/min — ABNORMAL LOW (ref >60)
GFR calc Af Amer: 15 mL/min — ABNORMAL LOW (ref >60)
GFR calc non Af Amer: 24 mL/min — ABNORMAL LOW
GFR calc non Af Amer: 17 mL/min — ABNORMAL LOW (ref >60)
GFR calc non Af Amer: 13 mL/min — ABNORMAL LOW (ref >60)
Glucose, Bld: 152 mg/dL — ABNORMAL HIGH (ref 70–99)
Glucose, Bld: 120 mg/dL — ABNORMAL HIGH (ref 70–99)
Glucose, Bld: 107 mg/dL — ABNORMAL HIGH (ref 70–99)
Phosphorus: 3.7 mg/dL (ref 2.5–4.6)
Phosphorus: 5.2 mg/dL — ABNORMAL HIGH (ref 2.5–4.6)
Phosphorus: 5.7 mg/dL — ABNORMAL HIGH (ref 2.5–4.6)
Potassium: 3.7 mmol/L (ref 3.5–5.1)
Potassium: 4.6 mmol/L (ref 3.5–5.1)
Potassium: 4.9 mmol/L (ref 3.5–5.1)
Sodium: 136 mmol/L (ref 135–145)
Sodium: 135 mmol/L (ref 135–145)
Sodium: 136 mmol/L (ref 135–145)

## 2019-04-26 LAB — TYPE AND SCREEN
ABO/RH(D): B POS
Antibody Screen: NEGATIVE
Unit division: 0
Unit division: 0

## 2019-04-26 LAB — BPAM RBC
Blood Product Expiration Date: 202103172359
Blood Product Expiration Date: 202103192359
ISSUE DATE / TIME: 202103081318
ISSUE DATE / TIME: 202103100945
Unit Type and Rh: 7300
Unit Type and Rh: 7300

## 2019-04-26 LAB — GLUCOSE, CAPILLARY
Glucose-Capillary: 101 mg/dL — ABNORMAL HIGH (ref 70–99)
Glucose-Capillary: 108 mg/dL — ABNORMAL HIGH (ref 70–99)
Glucose-Capillary: 116 mg/dL — ABNORMAL HIGH (ref 70–99)
Glucose-Capillary: 116 mg/dL — ABNORMAL HIGH (ref 70–99)
Glucose-Capillary: 122 mg/dL — ABNORMAL HIGH (ref 70–99)
Glucose-Capillary: 128 mg/dL — ABNORMAL HIGH (ref 70–99)

## 2019-04-26 LAB — BODY FLUID CULTURE: Culture: NO GROWTH

## 2019-04-26 LAB — MAGNESIUM: Magnesium: 1.9 mg/dL (ref 1.7–2.4)

## 2019-04-26 MED ORDER — CIPROFLOXACIN IN D5W 400 MG/200ML IV SOLN: 400.0000 mg | INTRAVENOUS | Status: DC

## 2019-04-26 MED ORDER — CHLORHEXIDINE GLUCONATE CLOTH 2 % EX PADS: 6.0000 | MEDICATED_PAD | Freq: Every day | CUTANEOUS | Status: DC

## 2019-04-26 NOTE — Progress Notes (Signed)
SLP Cancellation Note  Patient Details Name: Jesse Murphy MRN: [id number] DOB: 12/08/1928   Cancelled treatment:       Reason Eval/Treat Not Completed: Medical issues which prohibited therapy(MBS scheduled for today at 13:30 but was deferred due to the RT having concerns about the pt's respiratory needs. SLP will follow up on subsequent date for possible repeat MBS.)  Suellyn Meenan I. [name], [place], [place] Office number [phone] Pager [place] 04/26/2019, 1:44 PM

## 2019-04-26 NOTE — Progress Notes (Signed)
TCTS BRIEF SICU PROGRESS NOTE  3 Days Post-Op  S/P Procedure(s) (LRB): INSERTION PLEURAL DRAINAGE CATHETER TO DRAIN LEFT PLEURAL EFFUSION (Left)   Stable day  Plan: Continue current plan  Rexene Alberts, MD 04/26/2019 5:52 PM

## 2019-04-26 NOTE — Progress Notes (Signed)
[place] PCCM Sign Off Note   Brief History: 86 year old male went CABG with left atrial appendage clipping on 1/29 with chronic respiratory failure following status post tracheostomy 2/9 per TCTS.  Hospital course complicated by: Respiratory failure, AKI requiring CVVHD, hematuria requiring continuous bladder irrigation and recurrent pleural effusions requiring Pleurx catheter placement.  Patient has progressed to tolerating being out of bed to chair with Passy-Muir valve and ATC.    Blood pressure (!) 110/35, pulse 77, temperature (!) 97.2 F (36.2 C), temperature source Oral, resp. rate 18, height 5\' 8"  (1.727 m), weight 85.8 kg, SpO2 94 %.   Exam General: Chronically ill-appearing adult male in no acute distress sitting up in bed eating breakfast PSY: pleasant, calm / appropriate  HEENT: MM pink/moist, #4 trach midline, clean/dry, ATC 28% Neuro: AAO x4, generalized weakness but moving all extremities CV: s1s2 RRR, no m/r/g PULM: Nonlabored on ATC, or anterior with diminished bases GI: soft, bsx4 active  Extremities: warm/dry, 1+ generalized edema but overall significantly improved Skin: no rashes or lesions.  Prior surgical sites well approximated    Chronic respiratory failure status post tracheostomy (per TCTS) Pleural effusions status post Pleurx CABG with left atrial appendage clipping AKI Hematuria  Plan -per primary -continue ATC, trach care per protocol  -decannulation per primary -continue to push PT/OT efforts  PCCM will sign off.  Please call back if new needs arise.   Noe Gens, MSN, NP-C [facility] 04/26/2019, 11:07 AM   Please see [email or web address] for pager details.

## 2019-04-26 NOTE — Progress Notes (Signed)
Patient ID: CELIA GIBBONS, male   DOB: Sep 23, 1928, 86 y.o.   MRN: [id number] f    Advanced Heart Failure Rounding Note  PCP-Cardiologist: Kate Sable, MD   Subjective:    Impella removed 2/23. Intraoperative TEE showed EF ~40%  Had tunneled cath placed 3/9 and left PleurX tube.   Off NE. Tolerated iHD. Yesterday  Remains on TC. SBP 90s-low 100s.   No urine output. CBI fluid clearing   Objective:   Weight Range: 85.8 kg Body mass index is 28.76 kg/m.   Vital Signs:   Temp:  [97.2 F (36.2 C)-98.4 F (36.9 C)] 98.2 F (36.8 C) (03/11 1219) Pulse Rate:  [65-80] 74 (03/11 1401) Resp:  [12-31] 18 (03/11 1401) BP: (73-117)/(31-65) 96/36 (03/11 1400) SpO2:  [86 %-100 %] 93 % (03/11 1400) FiO2 (%):  [35 %-45 %] 45 % (03/11 1020) Weight:  [85.8 kg] 85.8 kg (03/11 0400) Last BM Date: 04/25/19  Weight change: Filed Weights   04/25/19 0500 04/25/19 1100 04/26/19 0400  Weight: 84.9 kg 91.3 kg 85.8 kg    Intake/Output:   Intake/Output Summary (Last 24 hours) at 04/26/2019 1619 Last data filed at 04/26/2019 1254 Gross per 24 hour  Intake 45858.03 ml  Output 39351 ml  Net 6507.03 ml      Physical Exam   General:  Sitting in chair on TC. No resp difficulty HEENT: normal Neck: supple. + TC. JVP 5-6 Carotids 2+ bilat; no bruits. No lymphadenopathy or thryomegaly appreciated. Cor: PMI nondisplaced. Regular rate & rhythm. No rubs, gallops or murmurs. Lungs: clear Abdomen: soft, nontender, nondistended. No hepatosplenomegaly. No bruits or masses. Good bowel sounds. Extremities: no cyanosis, clubbing, rash, 1+ edema Neuro: alert & orientedx3, cranial nerves grossly intact. moves all 4 extremities w/o difficulty. Affect pleasant   Telemetry   NSR 60-70s personally reviewed   Labs    CBC Recent Labs    04/24/19 0456 04/25/19 0454  WBC 9.9 9.9  HGB 8.2* 7.4*  HCT 26.5* 23.6*  MCV 94.3 93.7  PLT 190 182*   Basic Metabolic Panel Recent Labs    04/25/19  0454 04/25/19 0454 04/25/19 1615 04/26/19 0340  NA 133*   < > 136 135  K 4.9   < > 3.7 4.6  CL 89*   < > 95* 97*  CO2 30   < > 30 25  GLUCOSE 118*   < > 152* 120*  BUN 102*   < > 36* 53*  CREATININE 4.34*   < > 2.34* 3.13*  CALCIUM 7.7*   < > 8.1* 8.0*  MG 2.2  --   --  1.9  PHOS 7.0*   < > 3.7 5.2*   < > = values in this interval not displayed.   Liver Function Tests Recent Labs    04/25/19 1615 04/26/19 0340  ALBUMIN 2.5* 2.3*   No results for input(s): LIPASE, AMYLASE in the last 72 hours. Cardiac Enzymes No results for input(s): CKTOTAL, CKMB, CKMBINDEX, TROPONINI in the last 72 hours.  BNP: BNP (last 3 results) Recent Labs    03/22/19 0401 03/26/19 0626 03/27/19 1255  BNP 340.4* 687.5* 800.4*    ProBNP (last 3 results) No results for input(s): PROBNP in the last 8760 hours.   D-Dimer No results for input(s): DDIMER in the last 72 hours. Hemoglobin A1C No results for input(s): HGBA1C in the last 72 hours. Fasting Lipid Panel No results for input(s): CHOL, HDL, LDLCALC, TRIG, CHOLHDL, LDLDIRECT in the last 72 hours. Thyroid  Function Tests No results for input(s): TSH, T4TOTAL, T3FREE, THYROIDAB in the last 72 hours.  Invalid input(s): FREET3  Other results:   Imaging    No results found.   Medications:     Scheduled Medications: . amiodarone  200 mg Oral Daily  . aspirin  81 mg Oral Daily  . atorvastatin  80 mg Oral q1800  . chlorhexidine gluconate (MEDLINE KIT)  15 mL Mouth Rinse BID  . Chlorhexidine Gluconate Cloth  6 each Topical Q0600  . ciprofloxacin  500 mg Oral Q24H  . darbepoetin (ARANESP) injection - NON-DIALYSIS  100 mcg Subcutaneous Q Tue-1800  . feeding supplement (NEPRO CARB STEADY)  237 mL Oral BID BM  . feeding supplement (PRO-STAT SUGAR FREE 64)  30 mL Per Tube BID  . Gerhardt's butt cream   Topical BID  . hydrocortisone   Rectal BID  . insulin aspart  0-24 Units Subcutaneous Q4H  . insulin glargine  36 Units  Subcutaneous Daily  . mouth rinse  15 mL Mouth Rinse 10 times per day  . midodrine  15 mg Oral TID WC  . multivitamin  1 tablet Oral QHS  . pantoprazole (PROTONIX) IV  40 mg Intravenous Q24H  . simethicone  80 mg Oral BID PC  . sodium chloride flush  10-40 mL Intracatheter Q12H    Infusions: . sodium chloride    . sodium chloride    . sodium chloride    . feeding supplement (JEVITY 1.5 CAL/FIBER) 1,000 mL (04/25/19 1626)  . magnesium sulfate bolus IVPB    . norepinephrine (LEVOPHED) Adult infusion Stopped (04/24/19 1800)    PRN Medications: sodium chloride, sodium chloride, sodium chloride, oxyCODONE **AND** acetaminophen, alteplase, dextrose, heparin, heparin, hyoscyamine, levalbuterol, lidocaine (PF), lidocaine-prilocaine, loperamide HCl, ondansetron (ZOFRAN) IV, pentafluoroprop-tetrafluoroeth, promethazine, Resource ThickenUp Clear, sodium chloride flush, sodium chloride flush    Assessment/Plan   1. Acute systolic HF -> Cardiogenic shock - post-op echo on 03/20/19 EF 25-30% (pre-op 30-35%. In 12/2017 EF normal) - Required Impella support post CABG. Impella removed 2/23 - volume improved.  - tolerated iHD on 3/10 off pressors  2. CAD s/p CABG this admit (LIMA->LAD, SVG->OM, SVG->RCA on 03/15/19) - No s/s of ischemia - Continue ASA +statin  3. Acute hypoxic respiratory failure with Pseudomonas PNA - s/p trach on 03/27/19 - completed meropenem for pseudomonas PNA - trach aspirate now growing Pseudomonas again.   - now on cipro.  We will continue for a total of 14 days.  Discussed with pharmD - stable on TC  4. PAF - s/p MAZE and LAA occlusion - Has been in/out AF. - Now maintaining NSR on po amio  5. AKI  - due to shock/ATN - On CVVHD. Making minimal urine - Tunneled cath placed 3/9  -He tolerated intermittent hemodialysis on 3/10. Will try again tomorrow.    6. Hematuria - Clearing. Remains on CBI. Will reach back out to Urology   7. Melena - Resolved  8.  Anemia - No evidence of active bleeding  Transfuse to keep hgb >= 7.5    9. Debility, severe - continue PT  10 UE swelling - u/s negative for DVT.  - Continue wraps.   11. F/E/N - Tube feeds per Cor-trak  12. Left Pleural Effusion  - moderate to large. - s/p PleurX on 3/8.   Overall remains very tenuous.  He was able to tolerate intermittent hemodialysis yesterday for the first time off pressors.  Continue to try and mobilize.  Appreciate nephrology support.  Glori Bickers, MD  4:19 PM

## 2019-04-26 NOTE — Progress Notes (Signed)
Physical Therapy Treatment Patient Details Name: Jesse Murphy MRN: [id number] DOB: 1929-01-02 Today's Date: 04/26/2019    History of Present Illness 86 y.o. male with PMH: h/o CAD s/p CABG and clipping of left atrial appendage, who presented with SOB on 2/1. He became hypoxic on 2/2 with pulmonary edema and needed to be intubated on 2/2 and extubated 2/5. Pt underwent impella placement and tracheostomy on 2/9. Pt started on CRRT 2/11. Impella removal and tracheostomy 2/23. CRRT restarted 3/1 as pt could not tolerate HD.    PT Comments    Patient seen for mobility progression. Pt requires mod A +2 for functional transfer/gait training and tolerated ambulating 6 ft this session. PT will continue to follow acutely and progress as tolerated.    Follow Up Recommendations  SNF;Supervision/Assistance - 24 hour     Equipment Recommendations  Other (comment)(defer)    Recommendations for Other Services       Precautions / Restrictions Precautions Precautions: Fall;Sternal Precaution Booklet Issued: No Precaution Comments: NG tube, CRRT, continuous bladder irrigation Restrictions Weight Bearing Restrictions: No    Mobility  Bed Mobility Overal bed mobility: Needs Assistance Bed Mobility: Supine to Sit     Supine to sit: HOB elevated;+2 for physical assistance;Mod assist     General bed mobility comments: cues for sequencing; pt able to bring bilat LE to EOB and use of bed pad to bring hips to EOB and to elevate trunk into sitting   Transfers Overall transfer level: Needs assistance Equipment used: (+2 with gait belt and bed pad; eva walker) Transfers: Sit to/from Stand Sit to Stand: Mod assist;+2 physical assistance         General transfer comment: assist to power up into standing; eva walker used upon standing   Ambulation/Gait Ambulation/Gait assistance: +2 physical assistance;Mod assist Gait Distance (Feet): 6 Feet Assistive device: (eva walker) Gait  Pattern/deviations: Step-to pattern;Decreased step length - right;Decreased step length - left Gait velocity: decreased   General Gait Details: cues for sequencing and assist for balance and managing AD; distance limited by dizziness and pt requesting to sit down   Stairs             Wheelchair Mobility    Modified Rankin (Stroke Patients Only)       Balance Overall balance assessment: Needs assistance Sitting-balance support: Feet supported;No upper extremity supported Sitting balance-Leahy Scale: Fair     Standing balance support: During functional activity Standing balance-Leahy Scale: Poor                              Cognition Arousal/Alertness: Awake/alert Behavior During Therapy: Flat affect Overall Cognitive Status: Within Functional Limits for tasks assessed                                 General Comments: pt verbalized minimally during session; HOH      Exercises      General Comments General comments (skin integrity, edema, etc.): RN present and reports systolic BP should be >53; systolic BP 61-443 and diastolic 15-40; increase in BP with mobility       Pertinent Vitals/Pain Pain Assessment: No/denies pain    Home Living                      Prior Function            PT Goals (  current goals can now be found in the care plan section) Progress towards PT goals: Progressing toward goals    Frequency    Min 2X/week      PT Plan Current plan remains appropriate    Co-evaluation PT/OT/SLP Co-Evaluation/Treatment: Yes Reason for Co-Treatment: Complexity of the patient's impairments (multi-system involvement);For patient/therapist safety;To address functional/ADL transfers PT goals addressed during session: Mobility/safety with mobility        AM-PAC PT "6 Clicks" Mobility   Outcome Measure  Help needed turning from your back to your side while in a flat bed without using bedrails?: A Lot Help  needed moving from lying on your back to sitting on the side of a flat bed without using bedrails?: A Lot Help needed moving to and from a bed to a chair (including a wheelchair)?: A Lot Help needed standing up from a chair using your arms (e.g., wheelchair or bedside chair)?: A Lot Help needed to walk in hospital room?: A Lot Help needed climbing 3-5 steps with a railing? : Total 6 Click Score: 11    End of Session Equipment Utilized During Treatment: Oxygen(trach collar, 40% Fi02 10L (15L for amb)) Activity Tolerance: Patient tolerated treatment well;Other (comment)(limited by dizziness) Patient left: with call bell/phone within reach;with family/visitor present;in chair Nurse Communication: Mobility status PT Visit Diagnosis: Other abnormalities of gait and mobility (R26.89);Muscle weakness (generalized) (M62.81)     Time: 2263-3354 PT Time Calculation (min) (ACUTE ONLY): 47 min  Charges:  $Gait Training: 23-37 mins                     Earney Navy, PTA Acute Rehabilitation Services Pager: [phone] Office: [phone]     Darliss Cheney 04/26/2019, 5:54 PM

## 2019-04-26 NOTE — Progress Notes (Signed)
Pharmacy  Patient ordered IV cipro 500mg  for pre-op prophylaxis. Patient is on scheduled cipro 400mg  daily which has been dose adjusted based on his renal function. No prophylaxis needed.   Erin Hearing PharmD., BCPS Clinical Pharmacist 04/26/2019 3:32 PM

## 2019-04-26 NOTE — Evaluation (Signed)
Occupational Therapy Evaluation Patient Details Name: Jesse Murphy MRN: [id number] DOB: [birth year] Today's Date: 04/26/2019    History of Present Illness 86 y.o. male with PMH: h/o CAD s/p CABG and clipping of left atrial appendage, who presented with SOB on 2/1. He became hypoxic on 2/2 with pulmonary edema and needed to be intubated on 2/2 and extubated 2/5. Pt underwent impella placement and tracheostomy on 2/9. Pt started on CRRT 2/11. Impella removal and tracheostomy 2/23. CRRT restarted 3/1 as pt could not tolerate HD. Pt on continuous bladder irrigation.   Clinical Impression   PTA pt independent for BADL/IADL, living with spouse. At time of eval, pt able to complete bed mobility with mod A +2 and sit <> stand with mod A +2 with eva walker. Pt still on sternal precautions and requiring increased support due to extra reliance on BLEs. Pt able to complete ~6 ft of functional mobility with eva walker before needing rest break. He c/o increased dizziness with mobility. RN present and reports systolic BP should be >48; systolic BP 54-627 and diastolic 03-50; increase in BP with mobility. VSS with trach collar on 12L. Recommend CIR for intensive therapies to promote independent PLOF before returning home. Will continue to follow per POC listed below.    Follow Up Recommendations  CIR;Supervision/Assistance - 24 hour    Equipment Recommendations  Other (comment)(TBD)    Recommendations for Other Services Rehab consult     Precautions / Restrictions Precautions Precautions: Fall;Sternal Precaution Booklet Issued: No Precaution Comments: NG tube, CRRT, continuous bladder irrigation Restrictions Weight Bearing Restrictions: No Other Position/Activity Restrictions: sternal precautions      Mobility Bed Mobility Overal bed mobility: Needs Assistance Bed Mobility: Supine to Sit     Supine to sit: HOB elevated;+2 for physical assistance;Mod assist     General bed mobility comments:  cues for sequencing; pt able to bring bilat LE to EOB and use of bed pad to bring hips to EOB and to elevate trunk into sitting   Transfers Overall transfer level: Needs assistance Equipment used: (eva walker) Transfers: Sit to/from Stand Sit to Stand: Mod assist;+2 physical assistance         General transfer comment: mod A +2 to rise and steady into eva walker    Balance Overall balance assessment: Needs assistance Sitting-balance support: Feet supported;No upper extremity supported Sitting balance-Leahy Scale: Fair     Standing balance support: During functional activity Standing balance-Leahy Scale: Poor Standing balance comment: reliant on external support                           ADL either performed or assessed with clinical judgement   ADL Overall ADL's : Needs assistance/impaired Eating/Feeding: Minimal assistance;Sitting   Grooming: Minimal assistance;Sitting   Upper Body Bathing: Moderate assistance;Sitting   Lower Body Bathing: Maximal assistance;Sit to/from stand   Upper Body Dressing : Moderate assistance;Sitting   Lower Body Dressing: Maximal assistance;Sit to/from stand   Toilet Transfer: Moderate assistance;+2 for physical assistance;+2 for safety/equipment   Toileting- Clothing Manipulation and Hygiene: Maximal assistance       Functional mobility during ADLs: Moderate assistance;+2 for physical assistance;+2 for safety/equipment;Rolling walker(eva walker) General ADL Comments: limited by generalized weakness and decrased activity tolerance     Vision Patient Visual Report: No change from baseline       Perception     Praxis      Pertinent Vitals/Pain Pain Assessment: No/denies pain  Hand Dominance     Extremity/Trunk Assessment Upper Extremity Assessment Upper Extremity Assessment: Generalized weakness   Lower Extremity Assessment Lower Extremity Assessment: Generalized weakness       Communication  Communication Communication: Tracheostomy   Cognition Arousal/Alertness: Awake/alert Behavior During Therapy: Flat affect Overall Cognitive Status: Difficult to assess                                 General Comments: able to verbalize minimally, but overall appeared [facility]   General Comments  RN present and reports systolic BP should be >16; systolic BP 10-960 and diastolic 45-40; increase in BP with mobility     Exercises     Shoulder Instructions      Home Living Family/patient expects to be discharged to:: Private residence Living Arrangements: Spouse/significant other Available Help at Discharge: Family;Available 24 hours/day Type of Home: House Home Access: Stairs to enter [employer] of Steps: 4   Home Layout: One level               Home Equipment: Other (comment)   Additional Comments: lift chair      Prior Functioning/Environment Level of Independence: Independent                 OT Problem List: Decreased strength;Decreased knowledge of use of DME or AE;Decreased knowledge of precautions;Decreased activity tolerance;Cardiopulmonary status limiting activity;Impaired balance (sitting and/or standing)      OT Treatment/Interventions: Self-care/ADL training;Therapeutic exercise;Patient/family education;Balance training;Energy conservation;Therapeutic activities;DME and/or AE instruction    OT Goals(Current goals can be found in the care plan section) Acute Rehab OT Goals Patient Stated Goal: return to prior level of independence OT Goal Formulation: With patient Time For Goal Achievement: 05/10/19 Potential to Achieve Goals: Good  OT Frequency: Min 2X/week   Barriers to D/C:            Co-evaluation PT/OT/SLP Co-Evaluation/Treatment: Yes Reason for Co-Treatment: Complexity of the patient's impairments (multi-system involvement);For patient/therapist safety;To address functional/ADL transfers PT goals addressed during  session: Mobility/safety with mobility OT goals addressed during session: ADL's and self-care;Proper use of Adaptive equipment and DME;Strengthening/ROM      AM-PAC OT "6 Clicks" Daily Activity     Outcome Measure Help from another person eating meals?: A Little Help from another person taking care of personal grooming?: A Little Help from another person toileting, which includes using toliet, bedpan, or urinal?: A Lot Help from another person bathing (including washing, rinsing, drying)?: A Lot Help from another person to put on and taking off regular upper body clothing?: A Lot Help from another person to put on and taking off regular lower body clothing?: A Lot 6 Click Score: 14   End of Session Equipment Utilized During Treatment: Oxygen;Rolling walker Nurse Communication: Mobility status  Activity Tolerance: Patient tolerated treatment well Patient left: in chair;with call bell/phone within reach  OT Visit Diagnosis: Unsteadiness on feet (R26.81);Other abnormalities of gait and mobility (R26.89);Muscle weakness (generalized) (M62.81)                Time: 9811-9147 OT Time Calculation (min): 47 min Charges:  OT General Charges $OT Visit: 1 Visit OT Evaluation $OT Eval Moderate Complexity: [place], MSOT, OTR/L [facility] Office Number: [phone] Pager: [phone]  Zenovia Jarred 04/26/2019, 6:34 PM

## 2019-04-26 NOTE — Progress Notes (Signed)
3 Days Post-Op  Subjective: CC: Gross Hematuria.  Hx: Mr. Weekly is off of anticoagulation but continues to have intermittent hematuria with clots that last required irrigation last night.   His urine is almost clear on moderate CBI today.   His Hgb continues to drift down with the bleeding.  ROS:  Review of Systems  Unable to perform ROS: Medical condition    Anti-infectives: Anti-infectives (From admission, onward)   Start     Dose/Rate Route Frequency Ordered Stop   04/25/19 0800  ciprofloxacin (CIPRO) tablet 500 mg     500 mg Oral Every 24 hours 04/24/19 1120     04/22/19 1345  ceFAZolin (ANCEF) IVPB 2g/100 mL premix     2 g 200 mL/hr over 30 Minutes Intravenous To Radiology 04/22/19 1341 04/23/19 0959   04/22/19 1130  ciprofloxacin (CIPRO) tablet 500 mg  Status:  Discontinued     500 mg Oral 2 times daily 04/22/19 1124 04/24/19 1120   04/20/19 1600  cefTRIAXone (ROCEPHIN) 1 g in sodium chloride 0.9 % 100 mL IVPB  Status:  Discontinued     1 g 200 mL/hr over 30 Minutes Intravenous Every 24 hours 04/20/19 1456 04/22/19 1124   04/19/19 1200  ceFAZolin (ANCEF) IVPB 1 g/50 mL premix     1 g 100 mL/hr over 30 Minutes Intravenous  Once 04/18/19 1204 04/19/19 1311   04/13/19 2000  meropenem (MERREM) 500 mg in sodium chloride 0.9 % 100 mL IVPB     500 mg 200 mL/hr over 30 Minutes Intravenous Every 24 hours 04/12/19 1242 04/13/19 2134   04/12/19 1800  meropenem (MERREM) 1 g in sodium chloride 0.9 % 100 mL IVPB  Status:  Discontinued     1 g 200 mL/hr over 30 Minutes Intravenous Every 12 hours 04/12/19 0826 04/12/19 1236   04/10/19 1800  meropenem (MERREM) 2 g in sodium chloride 0.9 % 100 mL IVPB  Status:  Discontinued     2 g 200 mL/hr over 30 Minutes Intravenous Every 12 hours 04/10/19 1400 04/12/19 0826   04/10/19 1637  vancomycin (VANCOCIN) powder  Status:  Discontinued       As needed 04/10/19 1638 04/10/19 1836   04/07/19 1100  meropenem (MERREM) 1 g in sodium chloride 0.9 % 100 mL  IVPB  Status:  Discontinued     1 g 200 mL/hr over 30 Minutes Intravenous 2 times daily 04/07/19 1011 04/07/19 1012   04/07/19 1100  meropenem (MERREM) 1 g in sodium chloride 0.9 % 100 mL IVPB  Status:  Discontinued     1 g 200 mL/hr over 30 Minutes Intravenous Every 8 hours 04/07/19 1012 04/10/19 1400   04/04/19 1700  vancomycin (VANCOCIN) IVPB 1000 mg/200 mL premix  Status:  Discontinued     1,000 mg 200 mL/hr over 60 Minutes Intravenous Every 24 hours 04/04/19 1612 04/06/19 1536   04/04/19 1000  vancomycin (VANCOREADY) IVPB 1250 mg/250 mL  Status:  Discontinued     1,250 mg 166.7 mL/hr over 90 Minutes Intravenous Every 24 hours 04/03/19 0925 04/04/19 1009   04/03/19 0930  vancomycin (VANCOREADY) IVPB 2000 mg/400 mL     2,000 mg 200 mL/hr over 120 Minutes Intravenous  Once 04/03/19 0925 04/03/19 1528   03/29/19 2200  cefTAZidime (FORTAZ) 2 g in sodium chloride 0.9 % 100 mL IVPB     2 g 200 mL/hr over 30 Minutes Intravenous Every 12 hours 03/29/19 1256 04/03/19 2244   03/28/19 1100  cefTAZidime (FORTAZ) 1 g  in sodium chloride 0.9 % 100 mL IVPB  Status:  Discontinued     1 g 200 mL/hr over 30 Minutes Intravenous Every 24 hours 03/28/19 0950 03/28/19 0950   03/28/19 1100  cefTAZidime (FORTAZ) 2 g in sodium chloride 0.9 % 100 mL IVPB  Status:  Discontinued     2 g 200 mL/hr over 30 Minutes Intravenous Every 24 hours 03/28/19 0950 03/28/19 0951   03/28/19 1100  cefTAZidime (FORTAZ) 2 g in sodium chloride 0.9 % 100 mL IVPB  Status:  Discontinued     2 g 200 mL/hr over 30 Minutes Intravenous Every 24 hours 03/28/19 0952 03/29/19 1256   03/26/19 1200  ceFEPIme (MAXIPIME) 2 g in sodium chloride 0.9 % 100 mL IVPB  Status:  Discontinued     2 g 200 mL/hr over 30 Minutes Intravenous Daily 03/26/19 1104 03/28/19 0950   03/19/19 1000  cefTRIAXone (ROCEPHIN) 1 g in sodium chloride 0.9 % 100 mL IVPB     1 g 200 mL/hr over 30 Minutes Intravenous Daily 03/19/19 0810 03/25/19 1117   03/16/19 2230   vancomycin (VANCOCIN) IVPB 1000 mg/200 mL premix     1,000 mg 200 mL/hr over 60 Minutes Intravenous  Once 03/16/19 1721 03/17/19 0024   03/16/19 1830  cefUROXime (ZINACEF) 1.5 g in sodium chloride 0.9 % 100 mL IVPB     1.5 g 200 mL/hr over 30 Minutes Intravenous Every 12 hours 03/16/19 1721 03/18/19 0557   03/16/19 0400  vancomycin (VANCOREADY) IVPB 1500 mg/300 mL  Status:  Discontinued     1,500 mg 150 mL/hr over 120 Minutes Intravenous To Surgery 03/15/19 1425 03/16/19 1715   03/16/19 0400  cefUROXime (ZINACEF) 1.5 g in sodium chloride 0.9 % 100 mL IVPB  Status:  Discontinued     1.5 g 200 mL/hr over 30 Minutes Intravenous To Surgery 03/15/19 1425 03/16/19 1715   03/16/19 0400  cefUROXime (ZINACEF) 750 mg in sodium chloride 0.9 % 100 mL IVPB     750 mg 200 mL/hr over 30 Minutes Intravenous To Surgery 03/15/19 1425 03/16/19 1540      Current Facility-Administered Medications  Medication Dose Route Frequency Provider Last Rate Last Admin  . 0.45 % sodium chloride infusion   Intravenous Continuous PRN Prescott Gum, Collier Salina, MD      . 0.9 %  sodium chloride infusion  100 mL Intravenous PRN Corliss Parish, MD      . 0.9 %  sodium chloride infusion  100 mL Intravenous PRN Corliss Parish, MD      . oxyCODONE (ROXICODONE) 5 MG/5ML solution 5 mg  5 mg Oral Q4H PRN Prescott Gum, Collier Salina, MD   5 mg at 04/23/19 0726   And  . acetaminophen (TYLENOL) 160 MG/5ML solution 325 mg  325 mg Oral Q4H PRN Ivin Poot, MD   325 mg at 04/23/19 0726  . alteplase (CATHFLO ACTIVASE) injection 2 mg  2 mg Intracatheter Once PRN Corliss Parish, MD      . amiodarone (PACERONE) tablet 200 mg  200 mg Oral Daily Bensimhon, Shaune Pascal, MD   200 mg at 04/26/19 1049  . aspirin chewable tablet 81 mg  81 mg Oral Daily Prescott Gum, Collier Salina, MD   81 mg at 04/26/19 1049  . atorvastatin (LIPITOR) tablet 80 mg  80 mg Oral q1800 Prescott Gum, Collier Salina, MD   80 mg at 04/25/19 1622  . chlorhexidine gluconate (MEDLINE KIT)  (PERIDEX) 0.12 % solution 15 mL  15 mL Mouth Rinse BID Lucianne Lei  Donney Rankins, MD   15 mL at 04/26/19 0847  . Chlorhexidine Gluconate Cloth 2 % PADS 6 each  6 each Topical Q0600 Corliss Parish, MD   6 each at 04/25/19 2300  . ciprofloxacin (CIPRO) tablet 500 mg  500 mg Oral Q24H Henri Medal, RPH   500 mg at 04/26/19 4782  . Darbepoetin Alfa (ARANESP) injection 100 mcg  100 mcg Subcutaneous Q Tue-1800 Corliss Parish, MD   100 mcg at 04/24/19 2101  . dextrose 50 % solution 0-50 mL  0-50 mL Intravenous PRN Prescott Gum, Collier Salina, MD      . feeding supplement (JEVITY 1.5 CAL/FIBER) liquid 1,000 mL  1,000 mL Oral Continuous Prescott Gum, Collier Salina, MD 40 mL/hr at 04/25/19 1626 1,000 mL at 04/25/19 1626  . feeding supplement (NEPRO CARB STEADY) liquid 237 mL  237 mL Oral BID BM Prescott Gum, Collier Salina, MD   237 mL at 04/24/19 1542  . feeding supplement (PRO-STAT SUGAR FREE 64) liquid 30 mL  30 mL Per Tube BID Prescott Gum, Collier Salina, MD   30 mL at 04/26/19 1049  . Gerhardt's butt cream   Topical BID Ivin Poot, MD   Given at 04/26/19 1049  . heparin injection 1,000 Units  1,000 Units Dialysis PRN Corliss Parish, MD   3,800 Units at 04/25/19 1452  . heparin injection 1,700 Units  20 Units/kg Dialysis PRN Corliss Parish, MD   1,700 Units at 04/25/19 1131  . hydrocortisone (ANUSOL-HC) 2.5 % rectal cream   Rectal BID Ivin Poot, MD   Given at 04/25/19 2216  . hyoscyamine (LEVSIN) tablet 0.125 mg  0.125 mg Oral Q6H PRN Ivin Poot, MD   0.125 mg at 04/22/19 2017  . insulin aspart (novoLOG) injection 0-24 Units  0-24 Units Subcutaneous Q4H Ivin Poot, MD   2 Units at 04/26/19 1323  . insulin glargine (LANTUS) injection 36 Units  36 Units Subcutaneous Daily Prescott Gum, Collier Salina, MD   36 Units at 04/26/19 1049  . levalbuterol (XOPENEX) nebulizer solution 0.63 mg  0.63 mg Nebulization Q6H PRN Ivin Poot, MD   0.63 mg at 04/18/19 0919  . lidocaine (PF) (XYLOCAINE) 1 % injection 5 mL  5 mL  Intradermal PRN Corliss Parish, MD      . lidocaine-prilocaine (EMLA) cream 1 application  1 application Topical PRN Prescott Gum, Collier Salina, MD      . loperamide HCl (IMODIUM) 1 MG/7.5ML suspension 4 mg  4 mg Oral PRN Ivin Poot, MD   4 mg at 04/24/19 0026  . magnesium sulfate IVPB 2 g 50 mL  2 g Intravenous Once Prescott Gum, Collier Salina, MD      . MEDLINE mouth rinse  15 mL Mouth Rinse 10 times per day Prescott Gum, Collier Salina, MD   15 mL at 04/26/19 1200  . midodrine (PROAMATINE) tablet 15 mg  15 mg Oral TID WC Prescott Gum, Collier Salina, MD   15 mg at 04/26/19 1323  . multivitamin (RENA-VIT) tablet 1 tablet  1 tablet Oral QHS Ivin Poot, MD   1 tablet at 04/25/19 2216  . norepinephrine (LEVOPHED) 16 mg in 220m premix infusion  2-10 mcg/min Intravenous Titrated VIvin Poot MD   Stopped at 04/24/19 1800  . ondansetron (ZOFRAN) injection 4 mg  4 mg Intravenous Q6H PRN VIvin Poot MD   4 mg at 04/26/19 0803  . pantoprazole (PROTONIX) injection 40 mg  40 mg Intravenous Q24H VIvin Poot MD   40 mg at  04/25/19 1622  . pentafluoroprop-tetrafluoroeth (GEBAUERS) aerosol 1 application  1 application Topical PRN Corliss Parish, MD      . promethazine (PHENERGAN) injection 6.25 mg  6.25 mg Intravenous Q6H PRN Prescott Gum, Collier Salina, MD   6.25 mg at 04/24/19 0841  . Resource ThickenUp Clear   Oral PRN Prescott Gum, Collier Salina, MD      . simethicone [facility]) chewable tablet 80 mg  80 mg Oral BID PC Prescott Gum, Collier Salina, MD   80 mg at 04/26/19 1049  . sodium chloride flush (NS) 0.9 % injection 10-40 mL  10-40 mL Intracatheter Q12H Prescott Gum, Collier Salina, MD   30 mL at 04/26/19 1050  . sodium chloride flush (NS) 0.9 % injection 10-40 mL  10-40 mL Intracatheter PRN Prescott Gum, Collier Salina, MD      . sodium chloride flush (NS) 0.9 % injection 3 mL  3 mL Intravenous PRN Prescott Gum, Collier Salina, MD   3 mL at 04/16/19 0954     Objective: Vital signs in last 24 hours: Temp:  [97.2 F (36.2 C)-98.4 F (36.9 C)] 98.2 F (36.8 C) (03/11  1219) Pulse Rate:  [65-80] 74 (03/11 1300) Resp:  [12-31] 23 (03/11 1300) BP: (73-117)/(31-65) 97/41 (03/11 1300) SpO2:  [86 %-100 %] 89 % (03/11 1300) FiO2 (%):  [35 %-45 %] 45 % (03/11 1020) Weight:  [85.8 kg] 85.8 kg (03/11 0400)  Intake/Output from previous day: 03/10 0701 - 03/11 0700 In: 46322.8 [P.O.:340; I.V.:152.8; NG/GT:1170] Out: 44601 [Urine:42600; Stool:1] Intake/Output this shift: Total I/O In: 6280 [P.O.:120; Other:6000; NG/GT:160] Out: 5501 [Urine:5500; Stool:1]   Physical Exam Vitals reviewed.  Constitutional:      Appearance: Normal appearance.  Genitourinary:    Comments: Urine light pink in catheter tubing.  Neurological:     Mental Status: He is alert.     Lab Results:  Recent Labs    04/24/19 0456 04/25/19 0454  WBC 9.9 9.9  HGB 8.2* 7.4*  HCT 26.5* 23.6*  PLT 190 145*   BMET Recent Labs    04/25/19 1615 04/26/19 0340  NA 136 135  K 3.7 4.6  CL 95* 97*  CO2 30 25  GLUCOSE 152* 120*  BUN 36* 53*  CREATININE 2.34* 3.13*  CALCIUM 8.1* 8.0*   PT/INR No results for input(s): LABPROT, INR in the last 72 hours. ABG No results for input(s): PHART, HCO3 in the last 72 hours.  Invalid input(s): PCO2, PO2  Studies/Results: DG Chest Port 1 View  Result Date: 04/25/2019 CLINICAL DATA:  History of CABG. EXAM: PORTABLE CHEST 1 VIEW COMPARISON:  Yesterday FINDINGS: Tracheostomy tube in place. Left-sided dialysis catheter and right PICC with tips at the SVC. Feeding tube at least reaches the stomach. Haziness of the bilateral lower chest from atelectasis and pleural fluid. A tunneled pleural catheter is seen on the left. Diffuse interstitial coarsening. IMPRESSION: Stable hardware positioning and layering pleural effusions. Vascular congestion/mild edema. Electronically Signed   By: Monte Fantasia M.D.   On: 04/25/2019 08:52     Assessment and Plan: Intermittent hematuria with clots and acute blood loss anemia.   His urine is only light pink  on moderate CBI today, but I will get him on the schedule for Monday if possible to do a cystoscopy with possible clot evac and fulguration and to assess the underlying cause of the bleeding.          LOS: 43 days    Irine Seal 04/26/2019 [phone] ID: [name], male   DOB: 17-Mar-1928,  86 y.o.   MRN: [id number]

## 2019-04-26 NOTE — Plan of Care (Addendum)
Stable during shift. Remained on TC during shift, sats stable, minimal secretions.   BP stable off levo gtt, on scheduled midodrine.   Foley in place for CBI, running at fast rate d/t frequent clots when flushed (sterile fashion) at access port, urine now clear/very light pink with no noticeable clots.  Tube feeds, on dysphagia II diet with poor appetite.   Problem: Clinical Measurements: Goal: Ability to maintain clinical measurements within normal limits will improve Outcome: Progressing Goal: Will remain free from infection Outcome: Progressing Goal: Respiratory complications will improve Outcome: Progressing Goal: Cardiovascular complication will be avoided Outcome: Progressing   Problem: Activity: Goal: Risk for activity intolerance will decrease Outcome: Progressing   Problem: Coping: Goal: Level of anxiety will decrease Outcome: Progressing   Problem: Pain Managment: Goal: General experience of comfort will improve Outcome: Progressing   Problem: Safety: Goal: Ability to remain free from injury will improve Outcome: Progressing

## 2019-04-26 NOTE — Progress Notes (Signed)
Jesse Murphy Progress Note    Assessment/ Plan:   1. AKI(Cr was 0.7 prior to surgery)- due to postoperative CHF/cardiogenic - started on CRRT  03/29/19 - 2/24. Attempted IHD on 2/26 with minimal UF --> increased pressors and rapid afib.  Then CRRT on 3/1-- 3/8 at 0645. Had been ontoleratingnet UF -CVP currently good- Off CRRT for procedures.   BUN and crt rising which indicates he will not be able to fly off of RRT. Plan is for IHD again tomorrow 2. Acute systolic CHF/cardiogenic shock- EF 25-30%; s/p Impella- now out. optimize volume as above. On midodrine  15 mg TID.  Now off pressors 3. CAD s/p [id number] 8/50/27 complicated by cardiogenic shock and ARF.Postop echo EF 25-30% 4. Acute hypoxic respiratory failure with pseudomonas pneumonia- s/p trach on 03/27/19.s/pabx per PCCM-> completed abx  5. Hematuria- bladder irrigationper urology recommendations-  Urine clearing up 6. ABLA- Hx Melena. Transfuse as needed. Have given RRBC's on 2/22 and 2/23. And 2/25. On aranesp 100 weekly- hgb down again  7. Protein malnutrition- moderate to severe- alb 2.3 8. Secondary hyperparathyroidism/bone mineral - stopped therenvela. phos rising HD correcting for now.  pth 116 9. Disposition-  he is a poor longterm dialysis candidate given his cardiogenic shock and trach.  If trach not able to be removed or converted to VERY low maintenance would need LTACH  Subjective:   -  Unable to tell UOP due to [name] HD hemodynamically- removed 2 liters.  He said it was "bad"  But unable to qualify that statement-  Just felt weak during and after.  Can you do it again tomorrow "I guess"    Objective:   BP 105/65   Pulse 80   Temp 97.9 F (36.6 C) (Oral)   Resp (!) 21   Ht '5\' 8"'  (1.727 m)   Wt 85.8 kg Comment: weighed twice, had HD yesterday  SpO2 94%   BMI 28.76 kg/m   Intake/Output Summary (Last 24 hours) at 04/26/2019 7412 Last data filed at 04/26/2019 8786 Gross per  24 hour  Intake 46339.46 ml  Output 42301 ml  Net 4038.46 ml   Weight change: 6.4 kg  Physical Exam: Gen: elderly male in bed chronically ill-appearing- alert, responsive HEENT trach in place; on vent  CVS: S1 S2 no rub Resp:clear;unlabored at rest; trach collar Abd: soft/ NT/ND  Ext:1-2+edema, in thighs as well Neuro - awake and interactive and follows commands, nods GU foley in place withirrigation, clear this AM but he tells me he had clots overnight  Access:  tunneled HD cath left IJ  Imaging: DG Chest Port 1 View  Result Date: 04/25/2019 CLINICAL DATA:  History of CABG. EXAM: PORTABLE CHEST 1 VIEW COMPARISON:  Yesterday FINDINGS: Tracheostomy tube in place. Left-sided dialysis catheter and right PICC with tips at the SVC. Feeding tube at least reaches the stomach. Haziness of the bilateral lower chest from atelectasis and pleural fluid. A tunneled pleural catheter is seen on the left. Diffuse interstitial coarsening. IMPRESSION: Stable hardware positioning and layering pleural effusions. Vascular congestion/mild edema. Electronically Signed   By: Monte Fantasia M.D.   On: 04/25/2019 08:52    Labs: BMET Recent Labs  Lab 04/23/19 0442 04/23/19 0442 04/23/19 0449 04/23/19 1827 04/24/19 0456 04/24/19 1521 04/25/19 0454 04/25/19 1615 04/26/19 0340  NA 136   < > 132* 133* 135 135 133* 136 135  K 4.2   < > 3.9 4.5 4.7 4.8 4.9 3.7 4.6  CL 90*   < >  86* 89* 91* 90* 89* 95* 97*  CO2 33*  --   --  32 '31 30 30 30 25  ' GLUCOSE 193*   < > 209* 212* 187* 96 118* 152* 120*  BUN 26*   < > 25* 43* 66* 83* 102* 36* 53*  CREATININE 1.50*   < > 1.40* 2.37* 3.11* 3.84* 4.34* 2.34* 3.13*  CALCIUM 10.1  --   --  6.8* 7.1* 7.8* 7.7* 8.1* 8.0*  PHOS 2.1*  --   --  4.1 5.0* 6.1* 7.0* 3.7 5.2*   < > = values in this interval not displayed.   CBC Recent Labs  Lab 04/23/19 0442 04/23/19 0442 04/23/19 0449 04/23/19 1827 04/24/19 0456 04/25/19 0454  WBC 10.5  --   --  9.9 9.9 9.9   HGB 7.1*   < > 7.5* 8.6* 8.2* 7.4*  HCT 24.0*   < > 22.0* 27.2* 26.5* 23.6*  MCV 97.6  --   --  94.8 94.3 93.7  PLT 219  --   --  204 190 145*   < > = values in this interval not displayed.    Medications:    . amiodarone  200 mg Oral Daily  . aspirin  81 mg Oral Daily  . atorvastatin  80 mg Oral q1800  . chlorhexidine gluconate (MEDLINE KIT)  15 mL Mouth Rinse BID  . Chlorhexidine Gluconate Cloth  6 each Topical Q0600  . ciprofloxacin  500 mg Oral Q24H  . darbepoetin (ARANESP) injection - NON-DIALYSIS  100 mcg Subcutaneous Q Tue-1800  . feeding supplement (NEPRO CARB STEADY)  237 mL Oral BID BM  . feeding supplement (PRO-STAT SUGAR FREE 64)  30 mL Per Tube BID  . Gerhardt's butt cream   Topical BID  . hydrocortisone   Rectal BID  . insulin aspart  0-24 Units Subcutaneous Q4H  . insulin glargine  36 Units Subcutaneous Daily  . mouth rinse  15 mL Mouth Rinse 10 times per day  . midodrine  15 mg Oral TID WC  . multivitamin  1 tablet Oral QHS  . pantoprazole (PROTONIX) IV  40 mg Intravenous Q24H  . simethicone  80 mg Oral BID PC  . sodium chloride flush  10-40 mL Intracatheter Q12H      Jesse Murphy A Jesse Murphy  04/26/2019, 6:49 AM

## 2019-04-26 NOTE — Progress Notes (Signed)
[address] HillsSuite [address]       [place] [address]             [phone]      3 Days Post-Op Procedure(s) (LRB): INSERTION PLEURAL DRAINAGE CATHETER TO DRAIN LEFT PLEURAL EFFUSION (Left) Subjective: Felt poorly with dialysis  Objective: Vital signs in last 24 hours: Temp:  [97.4 F (36.3 C)-98.4 F (36.9 C)] 97.9 F (36.6 C) (03/11 0400) Pulse Rate:  [65-80] 76 (03/11 0700) Cardiac Rhythm: Normal sinus rhythm;Bundle branch block (03/11 0400) Resp:  [12-30] 17 (03/11 0700) BP: (78-117)/(31-65) 113/37 (03/11 0700) SpO2:  [88 %-100 %] 93 % (03/11 0700) FiO2 (%):  [35 %-40 %] 35 % (03/11 0727) Weight:  [85.8 kg-91.3 kg] 85.8 kg (03/11 0400)  Hemodynamic parameters for last 24 hours: CVP:  [5 mmHg-8 mmHg] 6 mmHg  Intake/Output from previous day: 03/10 0701 - 03/11 0700 In: 46322.8 [P.O.:340; I.V.:152.8; NG/GT:1170] Out: 44601 [Urine:42600; Stool:1] Intake/Output this shift: Total I/O In: 3000 [Other:3000] Out: -   General appearance: alert, cooperative, fatigued and no distress Heart: regular rate and rhythm Lungs: dim in bases Abdomen: nontender Extremities: + edema, conts to improve Wound: incis healing well  Lab Results: Recent Labs    04/24/19 0456 04/25/19 0454  WBC 9.9 9.9  HGB 8.2* 7.4*  HCT 26.5* 23.6*  PLT 190 145*   BMET:  Recent Labs    04/25/19 1615 04/26/19 0340  NA 136 135  K 3.7 4.6  CL 95* 97*  CO2 30 25  GLUCOSE 152* 120*  BUN 36* 53*  CREATININE 2.34* 3.13*  CALCIUM 8.1* 8.0*    PT/INR: No results for input(s): LABPROT, INR in the last 72 hours. ABG    Component Value Date/Time   PHART 7.488 (H) 04/07/2019 0405   HCO3 25.9 04/07/2019 0405   TCO2 36 (H) 04/23/2019 0449   ACIDBASEDEF 1.0 04/05/2019 0540   O2SAT 67.8 04/14/2019 1253   CBG (last 3)  Recent Labs    04/26/19 0051 04/26/19 0347 04/26/19 0802  GLUCAP 116* 108* 122*    Meds Scheduled Meds: . amiodarone  200 mg Oral Daily  . aspirin  81 mg Oral  Daily  . atorvastatin  80 mg Oral q1800  . chlorhexidine gluconate (MEDLINE KIT)  15 mL Mouth Rinse BID  . Chlorhexidine Gluconate Cloth  6 each Topical Q0600  . ciprofloxacin  500 mg Oral Q24H  . darbepoetin (ARANESP) injection - NON-DIALYSIS  100 mcg Subcutaneous Q Tue-1800  . feeding supplement (NEPRO CARB STEADY)  237 mL Oral BID BM  . feeding supplement (PRO-STAT SUGAR FREE 64)  30 mL Per Tube BID  . Gerhardt's butt cream   Topical BID  . hydrocortisone   Rectal BID  . insulin aspart  0-24 Units Subcutaneous Q4H  . insulin glargine  36 Units Subcutaneous Daily  . mouth rinse  15 mL Mouth Rinse 10 times per day  . midodrine  15 mg Oral TID WC  . multivitamin  1 tablet Oral QHS  . pantoprazole (PROTONIX) IV  40 mg Intravenous Q24H  . simethicone  80 mg Oral BID PC  . sodium chloride flush  10-40 mL Intracatheter Q12H   Continuous Infusions: . sodium chloride    . sodium chloride    . sodium chloride    . feeding supplement (JEVITY 1.5 CAL/FIBER) 1,000 mL (04/25/19 1626)  . magnesium sulfate bolus IVPB    . norepinephrine (LEVOPHED) Adult infusion Stopped (04/24/19 1800)  PRN Meds:.sodium chloride, sodium chloride, sodium chloride, oxyCODONE **AND** acetaminophen, alteplase, dextrose, heparin, heparin, hyoscyamine, levalbuterol, lidocaine (PF), lidocaine-prilocaine, loperamide HCl, ondansetron (ZOFRAN) IV, pentafluoroprop-tetrafluoroeth, promethazine, Resource ThickenUp Clear, sodium chloride flush, sodium chloride flush  Xrays DG Chest Port 1 View  Result Date: 04/25/2019 CLINICAL DATA:  History of CABG. EXAM: PORTABLE CHEST 1 VIEW COMPARISON:  Yesterday FINDINGS: Tracheostomy tube in place. Left-sided dialysis catheter and right PICC with tips at the SVC. Feeding tube at least reaches the stomach. Haziness of the bilateral lower chest from atelectasis and pleural fluid. A tunneled pleural catheter is seen on the left. Diffuse interstitial coarsening. IMPRESSION: Stable hardware  positioning and layering pleural effusions. Vascular congestion/mild edema. Electronically Signed   By: Monte Fantasia M.D.   On: 04/25/2019 08:52    Assessment/Plan: S/P Procedure(s) (LRB): INSERTION PLEURAL DRAINAGE CATHETER TO DRAIN LEFT PLEURAL EFFUSION (Left)  1 hemodyn stable in sinus rhythm currently 2 sats ok with TC 3 using PMV well 4 eating portions of meals. conts TF's as well for now 5 BS well controlled 6 appreciate all the asistance from nephrology, AHF, Urology and PCCM- conts progression, hopefully will tolerate HD over time   LOS: 43 days    John Giovanni PA-C Pager [phone] 04/26/2019

## 2019-04-26 NOTE — Progress Notes (Signed)
Pt continues to have o2 saturations 86-90% on .80 aerosol trach collar.  He does not appear in any distress, color is good and work of breathing is normal.  Dr. Valeta Harms notified of the above observations.  CPT q4 w/a ordered as well as CXR.  Will continue to monitor patient closely for any changes.

## 2019-04-27 ENCOUNTER — Inpatient Hospital Stay (HOSPITAL_COMMUNITY): Payer: Medicare Other

## 2019-04-27 LAB — GLUCOSE, CAPILLARY
Glucose-Capillary: 106 mg/dL — ABNORMAL HIGH (ref 70–99)
Glucose-Capillary: 111 mg/dL — ABNORMAL HIGH (ref 70–99)
Glucose-Capillary: 118 mg/dL — ABNORMAL HIGH (ref 70–99)
Glucose-Capillary: 61 mg/dL — ABNORMAL LOW (ref 70–99)
Glucose-Capillary: 82 mg/dL (ref 70–99)
Glucose-Capillary: 82 mg/dL (ref 70–99)
Glucose-Capillary: 84 mg/dL (ref 70–99)
Glucose-Capillary: 86 mg/dL (ref 70–99)

## 2019-04-27 LAB — RENAL FUNCTION PANEL
Albumin: 2.2 g/dL — ABNORMAL LOW (ref 3.5–5.0)
Albumin: 2.2 g/dL — ABNORMAL LOW (ref 3.5–5.0)
Anion gap: 15 (ref 5–15)
Anion gap: 10 (ref 5–15)
BUN: 82 mg/dL — ABNORMAL HIGH (ref 8–23)
BUN: 31 mg/dL — ABNORMAL HIGH (ref 8–23)
CO2: 26 mmol/L (ref 22–32)
CO2: 29 mmol/L (ref 22–32)
Calcium: 8 mg/dL — ABNORMAL LOW (ref 8.9–10.3)
Calcium: 8.1 mg/dL — ABNORMAL LOW (ref 8.9–10.3)
Chloride: 96 mmol/L — ABNORMAL LOW (ref 98–111)
Chloride: 100 mmol/L (ref 98–111)
Creatinine, Ser: 4.53 mg/dL — ABNORMAL HIGH (ref 0.61–1.24)
Creatinine, Ser: 2.48 mg/dL — ABNORMAL HIGH (ref 0.61–1.24)
GFR calc Af Amer: 12 mL/min — ABNORMAL LOW (ref >60)
GFR calc Af Amer: 26 mL/min — ABNORMAL LOW (ref >60)
GFR calc non Af Amer: 11 mL/min — ABNORMAL LOW (ref >60)
GFR calc non Af Amer: 22 mL/min — ABNORMAL LOW (ref >60)
Glucose, Bld: 89 mg/dL (ref 70–99)
Glucose, Bld: 78 mg/dL (ref 70–99)
Phosphorus: 6.2 mg/dL — ABNORMAL HIGH (ref 2.5–4.6)
Phosphorus: 3.6 mg/dL (ref 2.5–4.6)
Potassium: 5.4 mmol/L — ABNORMAL HIGH (ref 3.5–5.1)
Potassium: 3.7 mmol/L (ref 3.5–5.1)
Sodium: 137 mmol/L (ref 135–145)
Sodium: 139 mmol/L (ref 135–145)

## 2019-04-27 LAB — CBC
HCT: 25.5 % — ABNORMAL LOW (ref 39.0–52.0)
Hemoglobin: 8.1 g/dL — ABNORMAL LOW (ref 13.0–17.0)
MCH: 29.7 pg (ref 26.0–34.0)
MCHC: 31.8 g/dL (ref 30.0–36.0)
MCV: 93.4 fL (ref 80.0–100.0)
Platelets: 160 10*3/uL (ref 150–400)
RBC: 2.73 MIL/uL — ABNORMAL LOW (ref 4.22–5.81)
RDW: 17 % — ABNORMAL HIGH (ref 11.5–15.5)
WBC: 12.5 10*3/uL — ABNORMAL HIGH (ref 4.0–10.5)
nRBC: 0 % (ref 0.0–0.2)

## 2019-04-27 LAB — MAGNESIUM: Magnesium: 2 mg/dL (ref 1.7–2.4)

## 2019-04-27 MED ORDER — DEXTROSE 50 % IV SOLN
12.5000 g | INTRAVENOUS | Status: AC
  Administered 2019-04-27: 12.5 g via INTRAVENOUS
  Filled 2019-04-27: qty 50

## 2019-04-27 MED ORDER — HEPARIN SODIUM (PORCINE) 1000 UNIT/ML IJ SOLN: 1700.0000 [IU] | Freq: Once | INTRAMUSCULAR | Status: AC

## 2019-04-27 MED ORDER — HEPARIN SODIUM (PORCINE) 1000 UNIT/ML IJ SOLN
INTRAMUSCULAR | Status: AC
  Administered 2019-04-27: 1700 [IU] via INTRAVENOUS_CENTRAL
  Filled 2019-04-27: qty 6

## 2019-04-27 NOTE — Progress Notes (Signed)
[facility] Progress Note    Assessment/ Plan:   1. AKI(Cr was 0.7 prior to surgery)- due to postoperative CHF/cardiogenic - started on CRRT  03/29/19 - 2/24. Attempted IHD on 2/26 with minimal UF --> increased pressors and rapid afib.  Then CRRT on 3/1-- 3/8 at 0645. Had been ontoleratingnet UF with  CRRT.   BUN and crt rising off CRRT which indicates he will remain HD dep. Tolerated IHD on 3/10, plan is to do again today 2. Acute systolic CHF/cardiogenic shock- EF 25-30%; s/p Impella- now out. optimize volume as above. On midodrine  15 mg TID.  Now off pressors 3. CAD s/p [id number] 08/20/21 complicated by cardiogenic shock and ARF.Postop echo EF 25-30% 4. Acute hypoxic respiratory failure with pseudomonas pneumonia- s/p trach on 03/27/19.s/pabx per PCCM-> completed abx  5. Hematuria- bladder irrigationper urology recommendations-   6. ABLA- Hx Melena. Transfuse as needed. Have given RRBC's on 2/22 and 2/23. And 2/25. On aranesp 100 weekly- hgb down again  7. Protein malnutrition- moderate to severe- alb 2.3 8. Secondary hyperparathyroidism/bone mineral - stopped therenvela. phos rising HD correcting for now.  pth 116 9. Disposition-  he is a poor longterm dialysis candidate given his cardiogenic shock and trach.  If trach not able to be removed or converted to VERY low maintenance would need LTACH.  Just not sure what end point is for him   Subjective:   -  Unable to tell UOP due to CBI-  Some kind of trach issue- CCM indicates maybe could be decannulated ?    Objective:   BP (!) 103/36   Pulse 74   Temp 98.2 F (36.8 C) (Oral)   Resp 18   Ht '5\' 8"'  (1.727 m)   Wt 87.1 kg   SpO2 93%   BMI 29.20 kg/m   Intake/Output Summary (Last 24 hours) at 04/27/2019 0739 Last data filed at 04/27/2019 0654 Gross per 24 hour  Intake 48850 ml  Output 40706 ml  Net 8144 ml   Weight change: -4.2 kg  Physical Exam: Gen: elderly male in bed chronically  ill-appearing- alert, responsive HEENT trach in place; on vent  CVS: S1 S2 no rub Resp:clear;unlabored at rest; trach collar Abd: soft/ NT/ND  Ext:1-2+edema, in thighs as well Neuro - awake and interactive and follows commands, nods GU foley in place withirrigation, clear this AM but he tells me he had clots overnight  Access:  tunneled HD cath left IJ  Imaging: DG CHEST PORT 1 VIEW  Result Date: 04/27/2019 CLINICAL DATA:  86 year old male with atrial fibrillation, RVR. Airway problem. EXAM: PORTABLE CHEST 1 VIEW COMPARISON:  Portable chest 04/26/2019 and earlier. FINDINGS: Portable AP semi upright view at 0139. Tracheostomy tube appears stable. Otherwise stable lines and tubes. Mediastinal contours are within normal limits. Prior CABG. Continued veiling opacity at the left lung base and dense retrocardiac opacity. Lung volumes and ventilation appears stable. No pneumothorax identified. No overt edema. IMPRESSION: 1. Stable lines and tubes. 2. Left pleural effusion and lower lobe collapse or consolidation suspected and stable. 3. No new cardiopulmonary abnormality identified. Electronically Signed   By: Genevie Ann M.D.   On: 04/27/2019 02:18   DG CHEST PORT 1 VIEW  Result Date: 04/26/2019 CLINICAL DATA:  Atrial fibrillation with rapid ventricular response. EXAM: PORTABLE CHEST 1 VIEW COMPARISON:  Chest radiograph yesterday. CT 04/20/2019 FINDINGS: Tracheostomy tube tip at the thoracic inlet. Right upper extremity PICC tip in the lower SVC. Left internal jugular dialysis catheter tip in  the lower SVC. Enteric tube in place, tip below the diaphragm not included in the field of view. Post median sternotomy and left atrial clipping. Left chest tube in place. Hazy bilateral lung opacities consistent with pleural effusions and atelectasis. Progressive interstitial thickening suggesting increased pulmonary edema. No pneumothorax. IMPRESSION: 1. Progressive interstitial thickening suggesting increased  pulmonary edema. 2. Hazy bilateral lung opacities consistent with pleural effusions and atelectasis. 3. Stable support apparatus. Electronically Signed   By: Keith Rake M.D.   On: 04/26/2019 18:00    Labs: BMET Recent Labs  Lab 04/24/19 0456 04/24/19 1521 04/25/19 0454 04/25/19 1615 04/26/19 0340 04/26/19 1732 04/27/19 0423  NA 135 135 133* 136 135 136 137  K 4.7 4.8 4.9 3.7 4.6 4.9 5.4*  CL 91* 90* 89* 95* 97* 97* 96*  CO2 '31 30 30 30 25 27 26  ' GLUCOSE 187* 96 118* 152* 120* 107* 89  BUN 66* 83* 102* 36* 53* 69* 82*  CREATININE 3.11* 3.84* 4.34* 2.34* 3.13* 3.99* 4.53*  CALCIUM 7.1* 7.8* 7.7* 8.1* 8.0* 8.1* 8.0*  PHOS 5.0* 6.1* 7.0* 3.7 5.2* 5.7* 6.2*   CBC Recent Labs  Lab 04/23/19 1827 04/24/19 0456 04/25/19 0454 04/27/19 0423  WBC 9.9 9.9 9.9 12.5*  HGB 8.6* 8.2* 7.4* 8.1*  HCT 27.2* 26.5* 23.6* 25.5*  MCV 94.8 94.3 93.7 93.4  PLT 204 190 145* 160    Medications:    . amiodarone  200 mg Oral Daily  . aspirin  81 mg Oral Daily  . atorvastatin  80 mg Oral q1800  . chlorhexidine gluconate (MEDLINE KIT)  15 mL Mouth Rinse BID  . Chlorhexidine Gluconate Cloth  6 each Topical Q0600  . Chlorhexidine Gluconate Cloth  6 each Topical Q0600  . ciprofloxacin  500 mg Oral Q24H  . darbepoetin (ARANESP) injection - NON-DIALYSIS  100 mcg Subcutaneous Q Tue-1800  . feeding supplement (NEPRO CARB STEADY)  237 mL Oral BID BM  . feeding supplement (PRO-STAT SUGAR FREE 64)  30 mL Per Tube BID  . Gerhardt's butt cream   Topical BID  . hydrocortisone   Rectal BID  . insulin aspart  0-24 Units Subcutaneous Q4H  . insulin glargine  36 Units Subcutaneous Daily  . mouth rinse  15 mL Mouth Rinse 10 times per day  . midodrine  15 mg Oral TID WC  . multivitamin  1 tablet Oral QHS  . pantoprazole (PROTONIX) IV  40 mg Intravenous Q24H  . simethicone  80 mg Oral BID PC  . sodium chloride flush  10-40 mL Intracatheter Q12H      Alexiz Cothran A Tegan Burnside  04/27/2019, 7:39 AM

## 2019-04-27 NOTE — Progress Notes (Signed)
Patient ID: Jesse Murphy, male   DOB: 05-01-1928, 86 y.o.   MRN: [id number]    Advanced Heart Failure Rounding Note  PCP-Cardiologist: Kate Sable, MD   Subjective:    Impella removed 2/23. Intraoperative TEE showed EF ~40%  Had tunneled cath placed 3/9 and left PleurX tube.   Remains on TC. Feels weak but ok. Off pressors. In NSR.   Still with some blood in urine. For iHD again today.   Objective:   Weight Range: 87.1 kg Body mass index is 29.2 kg/m.   Vital Signs:   Temp:  [97.6 F (36.4 C)-98.2 F (36.8 C)] 97.9 F (36.6 C) (03/12 1150) Pulse Rate:  [67-116] 116 (03/12 1528) Resp:  [12-25] 16 (03/12 1528) BP: (90-120)/(31-56) 92/42 (03/12 1528) SpO2:  [86 %-100 %] 97 % (03/12 1528) FiO2 (%):  [32 %-80 %] 32 % (03/12 0825) Weight:  [87.1 kg] 87.1 kg (03/12 1125) Last BM Date: 04/26/19  Weight change: Filed Weights   04/26/19 0400 04/27/19 0600 04/27/19 1125  Weight: 85.8 kg 87.1 kg 87.1 kg    Intake/Output:   Intake/Output Summary (Last 24 hours) at 04/27/2019 1538 Last data filed at 04/27/2019 1528 Gross per 24 hour  Intake 51590 ml  Output 53680 ml  Net -2090 ml      Physical Exam   General:  Lying in bed weak  On TC HEENT: normal Neck: supple. no JVD. +trach  Carotids 2+ bilat; no bruits. No lymphadenopathy or thryomegaly appreciated. Cor: PMI nondisplaced. Regular rate & rhythm. No rubs, gallops or murmurs. + tunneled cath Lungs: clear Abdomen: soft, nontender, nondistended. No hepatosplenomegaly. No bruits or masses. Good bowel sounds. Extremities: no cyanosis, clubbing, rash, 1+  edema Neuro: alert & orientedx3, cranial nerves grossly intact. moves all 4 extremities w/o difficulty. Affect pleasant    Telemetry   NSR 70s Personally reviewed   Labs    CBC Recent Labs    04/25/19 0454 04/27/19 0423  WBC 9.9 12.5*  HGB 7.4* 8.1*  HCT 23.6* 25.5*  MCV 93.7 93.4  PLT 145* 160   Basic Metabolic Panel Recent Labs     04/26/19 0340 04/26/19 0340 04/26/19 1732 04/27/19 0423  NA 135   < > 136 137  K 4.6   < > 4.9 5.4*  CL 97*   < > 97* 96*  CO2 25   < > 27 26  GLUCOSE 120*   < > 107* 89  BUN 53*   < > 69* 82*  CREATININE 3.13*   < > 3.99* 4.53*  CALCIUM 8.0*   < > 8.1* 8.0*  MG 1.9  --   --  2.0  PHOS 5.2*   < > 5.7* 6.2*   < > = values in this interval not displayed.   Liver Function Tests Recent Labs    04/26/19 1732 04/27/19 0423  ALBUMIN 2.3* 2.2*   No results for input(s): LIPASE, AMYLASE in the last 72 hours. Cardiac Enzymes No results for input(s): CKTOTAL, CKMB, CKMBINDEX, TROPONINI in the last 72 hours.  BNP: BNP (last 3 results) Recent Labs    03/22/19 0401 03/26/19 0626 03/27/19 1255  BNP 340.4* 687.5* 800.4*    ProBNP (last 3 results) No results for input(s): PROBNP in the last 8760 hours.   D-Dimer No results for input(s): DDIMER in the last 72 hours. Hemoglobin A1C No results for input(s): HGBA1C in the last 72 hours. Fasting Lipid Panel No results for input(s): CHOL, HDL, LDLCALC, TRIG, CHOLHDL,  LDLDIRECT in the last 72 hours. Thyroid Function Tests No results for input(s): TSH, T4TOTAL, T3FREE, THYROIDAB in the last 72 hours.  Invalid input(s): FREET3  Other results:   Imaging    DG CHEST PORT 1 VIEW  Result Date: 04/27/2019 CLINICAL DATA:  86 year old male with atrial fibrillation, RVR. Airway problem. EXAM: PORTABLE CHEST 1 VIEW COMPARISON:  Portable chest 04/26/2019 and earlier. FINDINGS: Portable AP semi upright view at 0139. Tracheostomy tube appears stable. Otherwise stable lines and tubes. Mediastinal contours are within normal limits. Prior CABG. Continued veiling opacity at the left lung base and dense retrocardiac opacity. Lung volumes and ventilation appears stable. No pneumothorax identified. No overt edema. IMPRESSION: 1. Stable lines and tubes. 2. Left pleural effusion and lower lobe collapse or consolidation suspected and stable. 3. No new  cardiopulmonary abnormality identified. Electronically Signed   By: Genevie Ann M.D.   On: 04/27/2019 02:18   DG CHEST PORT 1 VIEW  Result Date: 04/26/2019 CLINICAL DATA:  Atrial fibrillation with rapid ventricular response. EXAM: PORTABLE CHEST 1 VIEW COMPARISON:  Chest radiograph yesterday. CT 04/20/2019 FINDINGS: Tracheostomy tube tip at the thoracic inlet. Right upper extremity PICC tip in the lower SVC. Left internal jugular dialysis catheter tip in the lower SVC. Enteric tube in place, tip below the diaphragm not included in the field of view. Post median sternotomy and left atrial clipping. Left chest tube in place. Hazy bilateral lung opacities consistent with pleural effusions and atelectasis. Progressive interstitial thickening suggesting increased pulmonary edema. No pneumothorax. IMPRESSION: 1. Progressive interstitial thickening suggesting increased pulmonary edema. 2. Hazy bilateral lung opacities consistent with pleural effusions and atelectasis. 3. Stable support apparatus. Electronically Signed   By: Keith Rake M.D.   On: 04/26/2019 18:00     Medications:     Scheduled Medications: . amiodarone  200 mg Oral Daily  . aspirin  81 mg Oral Daily  . atorvastatin  80 mg Oral q1800  . chlorhexidine gluconate (MEDLINE KIT)  15 mL Mouth Rinse BID  . Chlorhexidine Gluconate Cloth  6 each Topical Q0600  . Chlorhexidine Gluconate Cloth  6 each Topical Q0600  . darbepoetin (ARANESP) injection - NON-DIALYSIS  100 mcg Subcutaneous Q Tue-1800  . feeding supplement (NEPRO CARB STEADY)  237 mL Oral BID BM  . feeding supplement (PRO-STAT SUGAR FREE 64)  30 mL Per Tube BID  . Gerhardt's butt cream   Topical BID  . hydrocortisone   Rectal BID  . insulin aspart  0-24 Units Subcutaneous Q4H  . insulin glargine  36 Units Subcutaneous Daily  . mouth rinse  15 mL Mouth Rinse 10 times per day  . midodrine  15 mg Oral TID WC  . multivitamin  1 tablet Oral QHS  . pantoprazole (PROTONIX) IV  40 mg  Intravenous Q24H  . simethicone  80 mg Oral BID PC  . sodium chloride flush  10-40 mL Intracatheter Q12H    Infusions: . sodium chloride    . sodium chloride    . sodium chloride    . feeding supplement (JEVITY 1.5 CAL/FIBER) 1,000 mL (04/26/19 2046)  . magnesium sulfate bolus IVPB    . norepinephrine (LEVOPHED) Adult infusion Stopped (04/24/19 1800)    PRN Medications: sodium chloride, sodium chloride, sodium chloride, oxyCODONE **AND** acetaminophen, alteplase, dextrose, heparin, heparin, hyoscyamine, levalbuterol, lidocaine (PF), lidocaine-prilocaine, loperamide HCl, ondansetron (ZOFRAN) IV, pentafluoroprop-tetrafluoroeth, promethazine, Resource ThickenUp Clear, sodium chloride flush, sodium chloride flush    Assessment/Plan   1. Acute systolic HF -> Cardiogenic shock -  post-op echo on 03/20/19 EF 25-30% (pre-op 30-35%. In 12/2017 EF normal) - Required Impella support post CABG. Impella removed 2/23 - volume status elevated.  - tolerated iHD on 3/10 off pressors - for iHD again today  2. CAD s/p CABG this admit (LIMA->LAD, SVG->OM, SVG->RCA on 03/15/19) - No s/s of ischemia - Continue ASA +statin  3. Acute hypoxic respiratory failure with Pseudomonas PNA - s/p trach on 03/27/19 - completed meropenem for pseudomonas PNA - trach aspirate now growing Pseudomonas again.   - now on cipro.  We will continue for a total of 14 days.  Discussed with pharmD - stable on TC  4. PAF - s/p MAZE and LAA occlusion - Has been in/out AF. - Now maintaining NSR on po amio. Will continue  5. AKI  - due to shock/ATN - On CVVHD. Making minimal urine - Tunneled cath placed 3/9  - He tolerated intermittent hemodialysis on 3/10.  - Plan for iHD today    6. Hematuria - Clearing. Remains on CBI. - If not resolved by Monday Urology wil take to OR for cysto  7. Melena - Resolved  8. Anemia - No evidence of active bleeding  Transfuse to keep hgb >= 7.5    9. Debility, severe - continue  PT  10 UE swelling - u/s negative for DVT.  - Continue wraps.   11. F/E/N - Tube feeds per Cor-trak  12. Left Pleural Effusion  - moderate to large. - s/p PleurX on 3/8.   Overall remains very tenuous.  He was able to tolerate intermittent hemodialysis yesterday for the first time off pressors.  Continue to try and mobilize. Reattempt iHD today   Glori Bickers, MD  3:38 PM

## 2019-04-27 NOTE — Progress Notes (Signed)
PCCM Interval Progress Note  Asked to see pt for probable trach dislodgement.  Discussed with RN and RT earlier in the night and RT informed me that pt had been on ATC but had been desaturating throughout the day 3/11 and requiring increased FiO2.  At one point, was up to 100% overnight.  RT placed ETCO2 detector on trach but noticed no color change.  Hazard applied at 4L and sats improved to 100%.  I asked for CXR which was unremarkable.  This morning I assessed pt at bedside.  He is resting comfortably on 4L [place] with sats 95 - 100%.  Trach to room air.  I occluded his trach with my thumb for over 2 minutes and he had no change in breathing pattern and no desaturations.  Trach placed by TCTS on 03/27/19.   Will defer to them regarding next steps but informed RN he can likely be capped and decannulated.  Could assess via bronch but given that desats started during the day 3/11, suspect that trach is in a false tract and that true tract has already started to close making trach exchange difficult and likely requiring trach redo.   PCCM had signed off 3/11.  Please call if further needs arise.   Jesse Murphy, [facility] 04/27/2019, 6:19 AM

## 2019-04-27 NOTE — Progress Notes (Addendum)
Approx [id number]  Pt intermittently desatting 87-90, remains on Trach collar, required increase in oxygen requirements during dayshift. Sats increase when pt awake and encouraged to take deep breaths. Pt calm, stable. RT has seen pt during shift, paged RT to bedside.  Bianca RT to bedside to assess, trach appears slightly dislodged, RT unable to deep suction via trach, end tidal CO2 detector placed on trach with no color change, pt placed on 4 l n/c with improvement in sats to 99%.   Dr Oletta Darter, eLink, cameraed in and updated. Dr Oletta Darter contacted Junius Roads from [facility] ground team. Shearon Stalls called, updated on events, stat chest xray ordered.   Pt remains stable.

## 2019-04-27 NOTE — Progress Notes (Addendum)
[address]       [place] [address]             [phone]      4 Days Post-Op Procedure(s) (LRB): INSERTION PLEURAL DRAINAGE CATHETER TO DRAIN LEFT PLEURAL EFFUSION (Left) Subjective: Feels fair  Objective: Vital signs in last 24 hours: Temp:  [97.2 F (36.2 C)-98.2 F (36.8 C)] 98.2 F (36.8 C) (03/12 0000) Pulse Rate:  [67-80] 76 (03/12 0700) Cardiac Rhythm: Normal sinus rhythm;Bundle branch block (03/12 0400) Resp:  [12-31] 19 (03/12 0700) BP: (73-120)/(34-47) 108/41 (03/12 0700) SpO2:  [86 %-100 %] 99 % (03/12 0700) FiO2 (%):  [45 %-80 %] 80 % (03/11 2312) Weight:  [87.1 kg] 87.1 kg (03/12 0600)  Hemodynamic parameters for last 24 hours: CVP:  [6 mmHg-9 mmHg] 9 mmHg  Intake/Output from previous day: 03/11 0701 - 03/12 0700 In: 48850 [P.O.:120; NG/GT:390] Out: 40706 [Urine:40700; Stool:1; Chest Tube:5] Intake/Output this shift: Total I/O In: 20 [Other:20] Out: 3600 [Urine:3600]  General appearance: alert, cooperative, fatigued and no distress Heart: regular rate and rhythm Lungs: dim left base Abdomen: soft, non-tender Extremities: min edema Wound: incis healing well  Lab Results: Recent Labs    04/25/19 0454 04/27/19 0423  WBC 9.9 12.5*  HGB 7.4* 8.1*  HCT 23.6* 25.5*  PLT 145* 160   BMET:  Recent Labs    04/26/19 1732 04/27/19 0423  NA 136 137  K 4.9 5.4*  CL 97* 96*  CO2 27 26  GLUCOSE 107* 89  BUN 69* 82*  CREATININE 3.99* 4.53*  CALCIUM 8.1* 8.0*    PT/INR: No results for input(s): LABPROT, INR in the last 72 hours. ABG    Component Value Date/Time   PHART 7.488 (H) 04/07/2019 0405   HCO3 25.9 04/07/2019 0405   TCO2 36 (H) 04/23/2019 0449   ACIDBASEDEF 1.0 04/05/2019 0540   O2SAT 67.8 04/14/2019 1253   CBG (last 3)  Recent Labs    04/26/19 2107 04/27/19 0033 04/27/19 0418  GLUCAP 116* 118* 82    Meds Scheduled Meds: . amiodarone  200 mg Oral Daily  . aspirin  81 mg Oral Daily  .  atorvastatin  80 mg Oral q1800  . chlorhexidine gluconate (MEDLINE KIT)  15 mL Mouth Rinse BID  . Chlorhexidine Gluconate Cloth  6 each Topical Q0600  . Chlorhexidine Gluconate Cloth  6 each Topical Q0600  . ciprofloxacin  500 mg Oral Q24H  . darbepoetin (ARANESP) injection - NON-DIALYSIS  100 mcg Subcutaneous Q Tue-1800  . feeding supplement (NEPRO CARB STEADY)  237 mL Oral BID BM  . feeding supplement (PRO-STAT SUGAR FREE 64)  30 mL Per Tube BID  . Gerhardt's butt cream   Topical BID  . hydrocortisone   Rectal BID  . insulin aspart  0-24 Units Subcutaneous Q4H  . insulin glargine  36 Units Subcutaneous Daily  . mouth rinse  15 mL Mouth Rinse 10 times per day  . midodrine  15 mg Oral TID WC  . multivitamin  1 tablet Oral QHS  . pantoprazole (PROTONIX) IV  40 mg Intravenous Q24H  . simethicone  80 mg Oral BID PC  . sodium chloride flush  10-40 mL Intracatheter Q12H   Continuous Infusions: . sodium chloride    . sodium chloride    . sodium chloride    . feeding supplement (JEVITY 1.5 CAL/FIBER) 1,000 mL (04/26/19 2046)  . magnesium sulfate bolus IVPB    . norepinephrine (LEVOPHED) Adult  infusion Stopped (04/24/19 1800)   PRN Meds:.sodium chloride, sodium chloride, sodium chloride, oxyCODONE **AND** acetaminophen, alteplase, dextrose, heparin, heparin, hyoscyamine, levalbuterol, lidocaine (PF), lidocaine-prilocaine, loperamide HCl, ondansetron (ZOFRAN) IV, pentafluoroprop-tetrafluoroeth, promethazine, Resource ThickenUp Clear, sodium chloride flush, sodium chloride flush  Xrays DG CHEST PORT 1 VIEW  Result Date: 04/27/2019 CLINICAL DATA:  86 year old male with atrial fibrillation, RVR. Airway problem. EXAM: PORTABLE CHEST 1 VIEW COMPARISON:  Portable chest 04/26/2019 and earlier. FINDINGS: Portable AP semi upright view at 0139. Tracheostomy tube appears stable. Otherwise stable lines and tubes. Mediastinal contours are within normal limits. Prior CABG. Continued veiling opacity at the  left lung base and dense retrocardiac opacity. Lung volumes and ventilation appears stable. No pneumothorax identified. No overt edema. IMPRESSION: 1. Stable lines and tubes. 2. Left pleural effusion and lower lobe collapse or consolidation suspected and stable. 3. No new cardiopulmonary abnormality identified. Electronically Signed   By: Genevie Ann M.D.   On: 04/27/2019 02:18   DG CHEST PORT 1 VIEW  Result Date: 04/26/2019 CLINICAL DATA:  Atrial fibrillation with rapid ventricular response. EXAM: PORTABLE CHEST 1 VIEW COMPARISON:  Chest radiograph yesterday. CT 04/20/2019 FINDINGS: Tracheostomy tube tip at the thoracic inlet. Right upper extremity PICC tip in the lower SVC. Left internal jugular dialysis catheter tip in the lower SVC. Enteric tube in place, tip below the diaphragm not included in the field of view. Post median sternotomy and left atrial clipping. Left chest tube in place. Hazy bilateral lung opacities consistent with pleural effusions and atelectasis. Progressive interstitial thickening suggesting increased pulmonary edema. No pneumothorax. IMPRESSION: 1. Progressive interstitial thickening suggesting increased pulmonary edema. 2. Hazy bilateral lung opacities consistent with pleural effusions and atelectasis. 3. Stable support apparatus. Electronically Signed   By: Keith Rake M.D.   On: 04/26/2019 18:00    Assessment/Plan: S/P Procedure(s) (LRB): INSERTION PLEURAL DRAINAGE CATHETER TO DRAIN LEFT PLEURAL EFFUSION (Left)  1 had some trouble oxygenating because appears that trach is not in proper position. Doing ok now on [place]- may be able to remove trach- will d/w PVT 2 for HD today, hopefully will tolerate better 3 remains hemodyn stable off pressors- AHF team conts to assist 4 H/H improving trend 5 conts CBI for hematuria 6 d/c epw's   LOS: 44 days    Jesse Giovanni PA-C Pager [phone] 04/27/2019  Cap trach for 24 hrs- [nasal cannula 4L]  remove trach in am Resume  nocturnal tube feeds, Assess swallow fx by SLT HD today If patient's hematuria does not resolve by Monday urology will perform cystoscopy in the OR under anesthesia.  Appreciate urology input for this patient. Maintaining NSR on po amio Leave EPWs until patient transferred out of ICU in case he gets bradycardic Slowly getting stronger  patient examined and medical record reviewed,agree with above note. Tharon Aquas Trigt III 04/27/2019

## 2019-04-27 NOTE — Progress Notes (Signed)
MD notified of Trach possibly dislodged.  X-ray ordered. No other orders received.  Pt in no distress.  PA Fuller Song stated they will address during the day unless patient becomes unstable.

## 2019-04-27 NOTE — Progress Notes (Signed)
Hypoglycemic Event  CBG: 61   Treatment: D50 25 mL (12.5 gm)  Symptoms: None  Follow-up CBG: Time:2045 CBG Result:111  Possible Reasons for Event: Inadequate meal intake  Comments/MD notified: Nocturnal tube feeds started at 2030. Dr Orvan Seen notified   Clerance Lav

## 2019-04-27 NOTE — Plan of Care (Signed)
  Problem: Education: Goal: Knowledge of General Education information will improve Description: Including pain rating scale, medication(s)/side effects and non-pharmacologic comfort measures Outcome: Progressing   Problem: Clinical Measurements: Goal: Diagnostic test results will improve Outcome: Progressing Goal: Respiratory complications will improve Outcome: Progressing Goal: Cardiovascular complication will be avoided Outcome: Progressing   Problem: Elimination: Goal: Will not experience complications related to urinary retention Outcome: Progressing

## 2019-04-27 NOTE — Plan of Care (Signed)
Stable during shift   Concern for dislodged trach reviewed with RT, CN, CCM. Sats stable on 3 l n/c.   CBI continues, requires very frequent irrigation/flushing (sterile fashion with NS syringe through foley sample port) to clear clots. Adjusted CBI flow rates with minimal improvement in achieving clear urine/no clots. Pt able to sense full bladder when foley clots, bladder scan done after irrigation to verify no distension and volume = ) ml. BUN/Cr increasing, HD scheduled for today. BP stable off pressors, on midodrine.   TF off for possible procedures today - modified barium swallow, bladder study.   Alert, oriented and interactive, assists with turning in bed.    Problem: Education: Goal: Knowledge of General Education information will improve Description: Including pain rating scale, medication(s)/side effects and non-pharmacologic comfort measures Outcome: Progressing   Problem: Clinical Measurements: Goal: Will remain free from infection Outcome: Progressing Goal: Respiratory complications will improve Outcome: Progressing   Problem: Activity: Goal: Risk for activity intolerance will decrease Outcome: Progressing   Problem: Safety: Goal: Ability to remain free from injury will improve Outcome: Progressing

## 2019-04-27 NOTE — Progress Notes (Signed)
SLP Cancellation Note  Patient Details Name: Jesse Murphy MRN: [id number] DOB: [birth year]   Cancelled treatment:       Reason Eval/Treat Not Completed: Patient at procedure or test/unavailable(MBS was scheduled for 2:00 but had to be deferred due to pt having dialysis at this time.)  [name] I. [name], [place], [place] Office number [phone] Pager [phone]  [name] 04/27/2019, 1:20 PM

## 2019-04-28 LAB — RENAL FUNCTION PANEL
Albumin: 2 g/dL — ABNORMAL LOW (ref 3.5–5.0)
Albumin: 2.1 g/dL — ABNORMAL LOW (ref 3.5–5.0)
Anion gap: 11 (ref 5–15)
Anion gap: 10 (ref 5–15)
BUN: 50 mg/dL — ABNORMAL HIGH (ref 8–23)
BUN: 64 mg/dL — ABNORMAL HIGH (ref 8–23)
CO2: 28 mmol/L (ref 22–32)
CO2: 28 mmol/L (ref 22–32)
Calcium: 7.8 mg/dL — ABNORMAL LOW (ref 8.9–10.3)
Calcium: 7.9 mg/dL — ABNORMAL LOW (ref 8.9–10.3)
Chloride: 97 mmol/L — ABNORMAL LOW (ref 98–111)
Chloride: 96 mmol/L — ABNORMAL LOW (ref 98–111)
Creatinine, Ser: 3.46 mg/dL — ABNORMAL HIGH (ref 0.61–1.24)
Creatinine, Ser: 4.04 mg/dL — ABNORMAL HIGH (ref 0.61–1.24)
GFR calc Af Amer: 17 mL/min — ABNORMAL LOW (ref >60)
GFR calc Af Amer: 14 mL/min — ABNORMAL LOW (ref >60)
GFR calc non Af Amer: 15 mL/min — ABNORMAL LOW (ref >60)
GFR calc non Af Amer: 12 mL/min — ABNORMAL LOW (ref >60)
Glucose, Bld: 129 mg/dL — ABNORMAL HIGH (ref 70–99)
Glucose, Bld: 120 mg/dL — ABNORMAL HIGH (ref 70–99)
Phosphorus: 5.5 mg/dL — ABNORMAL HIGH (ref 2.5–4.6)
Phosphorus: 6.5 mg/dL — ABNORMAL HIGH (ref 2.5–4.6)
Potassium: 4.6 mmol/L (ref 3.5–5.1)
Potassium: 5.2 mmol/L — ABNORMAL HIGH (ref 3.5–5.1)
Sodium: 136 mmol/L (ref 135–145)
Sodium: 134 mmol/L — ABNORMAL LOW (ref 135–145)

## 2019-04-28 LAB — GLUCOSE, CAPILLARY
Glucose-Capillary: 121 mg/dL — ABNORMAL HIGH (ref 70–99)
Glucose-Capillary: 122 mg/dL — ABNORMAL HIGH (ref 70–99)
Glucose-Capillary: 125 mg/dL — ABNORMAL HIGH (ref 70–99)
Glucose-Capillary: 125 mg/dL — ABNORMAL HIGH (ref 70–99)
Glucose-Capillary: 130 mg/dL — ABNORMAL HIGH (ref 70–99)
Glucose-Capillary: 160 mg/dL — ABNORMAL HIGH (ref 70–99)

## 2019-04-28 LAB — MAGNESIUM: Magnesium: 1.8 mg/dL (ref 1.7–2.4)

## 2019-04-28 MED ORDER — CHLORHEXIDINE GLUCONATE CLOTH 2 % EX PADS
6.0000 | MEDICATED_PAD | Freq: Every day | CUTANEOUS | Status: DC
  Administered 2019-04-29: 6 via TOPICAL

## 2019-04-28 MED ORDER — ORAL CARE MOUTH RINSE
15.0000 mL | Freq: Two times a day (BID) | OROMUCOSAL | Status: DC
  Administered 2019-04-29 – 2019-05-20 (×26): 15 mL via OROMUCOSAL

## 2019-04-28 MED ORDER — CHLORHEXIDINE GLUCONATE 0.12 % MT SOLN
15.0000 mL | Freq: Two times a day (BID) | OROMUCOSAL | Status: DC
  Administered 2019-04-29 – 2019-05-21 (×37): 15 mL via OROMUCOSAL
  Filled 2019-04-28 (×33): qty 15

## 2019-04-28 NOTE — Progress Notes (Signed)
[facility] Progress Note    Assessment/ Plan:   1. AKI(Cr was 0.7 prior to surgery)- due to postoperative CHF/cardiogenic - started on CRRT  03/29/19 - 2/24. Attempted IHD on 2/26 with minimal UF --> increased pressors and rapid afib.  Then CRRT on 3/1-- 3/8. Had been ontoleratingnet UF with  CRRT.   BUN and crt rising off CRRT which indicates he will remain HD dep. Tolerated IHD on 3/10 and on 3/12.  Next HD will be slated for Monday 3/15 2. Acute systolic CHF/cardiogenic shock- EF 25-30%; s/p Impella- now out. optimize volume as above. On midodrine  15 mg TID.  Now off pressors- still overloaded 3. CAD s/p [id number] 0/93/23 complicated by cardiogenic shock and ARF.Postop echo EF 25-30% 4. Acute hypoxic respiratory failure with pseudomonas pneumonia- s/p trach on 03/27/19.s/pabx per PCCM-> completed abx  5. Hematuria- bladder irrigationper urology recommendations-  Still req 6. ABLA- Hx Melena. Transfuse as needed. Have given RRBC's on 2/22 and 2/23. And 2/25. On aranesp 100 weekly- hgb down again  7. Protein malnutrition- moderate to severe- alb 2.3 8. Secondary hyperparathyroidism/bone mineral - stopped therenvela. phos rising - HD correcting for now.  pth 116 9. Disposition-  he is a poor longterm dialysis candidate given his cardiogenic shock and trach.  Getting trach out would be huge in terms of making him more a candidate for OP HD.  Have not pulled the trigger yet on perm access-  If cont to improve may be a candidate for that  Subjective:   -  Unable to tell UOP due to CBI-  Some kind of trach issue- CCM indicates maybe could be decannulated- capped right now !  Tolerated HD yesterday again- removed 2 liters "not bad but not good"    Objective:   BP (!) 108/39   Pulse 72   Temp 98.4 F (36.9 C) (Oral)   Resp 18   Ht '5\' 8"'  (1.727 m)   Wt 87.1 kg   SpO2 99%   BMI 29.20 kg/m   Intake/Output Summary (Last 24 hours) at 04/28/2019 0650 Last data  filed at 04/28/2019 0500 Gross per 24 hour  Intake 42580 ml  Output 47775 ml  Net -5195 ml   Weight change: 0 kg  Physical Exam: Gen: elderly male in bed chronically ill-appearing- alert, responsive HEENT trach in place- capped- talking CVS: S1 S2 no rub Resp:clear;unlabored at rest; trach collar Abd: soft/ NT/ND  Ext:1-2+edema, in thighs as well Neuro - awake and interactive and follows commands GU foley in place withirrigation, clear this AM but he tells me he had clots overnight  Access:  tunneled HD cath left IJ  Imaging: DG CHEST PORT 1 VIEW  Result Date: 04/27/2019 CLINICAL DATA:  86 year old male with atrial fibrillation, RVR. Airway problem. EXAM: PORTABLE CHEST 1 VIEW COMPARISON:  Portable chest 04/26/2019 and earlier. FINDINGS: Portable AP semi upright view at 0139. Tracheostomy tube appears stable. Otherwise stable lines and tubes. Mediastinal contours are within normal limits. Prior CABG. Continued veiling opacity at the left lung base and dense retrocardiac opacity. Lung volumes and ventilation appears stable. No pneumothorax identified. No overt edema. IMPRESSION: 1. Stable lines and tubes. 2. Left pleural effusion and lower lobe collapse or consolidation suspected and stable. 3. No new cardiopulmonary abnormality identified. Electronically Signed   By: Genevie Ann M.D.   On: 04/27/2019 02:18   DG CHEST PORT 1 VIEW  Result Date: 04/26/2019 CLINICAL DATA:  Atrial fibrillation with rapid ventricular response. EXAM: PORTABLE CHEST  1 VIEW COMPARISON:  Chest radiograph yesterday. CT 04/20/2019 FINDINGS: Tracheostomy tube tip at the thoracic inlet. Right upper extremity PICC tip in the lower SVC. Left internal jugular dialysis catheter tip in the lower SVC. Enteric tube in place, tip below the diaphragm not included in the field of view. Post median sternotomy and left atrial clipping. Left chest tube in place. Hazy bilateral lung opacities consistent with pleural effusions and  atelectasis. Progressive interstitial thickening suggesting increased pulmonary edema. No pneumothorax. IMPRESSION: 1. Progressive interstitial thickening suggesting increased pulmonary edema. 2. Hazy bilateral lung opacities consistent with pleural effusions and atelectasis. 3. Stable support apparatus. Electronically Signed   By: Keith Rake M.D.   On: 04/26/2019 18:00    Labs: BMET Recent Labs  Lab 04/25/19 0454 04/25/19 1615 04/26/19 0340 04/26/19 1732 04/27/19 0423 04/27/19 1641 04/28/19 0516  NA 133* 136 135 136 137 139 136  K 4.9 3.7 4.6 4.9 5.4* 3.7 4.6  CL 89* 95* 97* 97* 96* 100 97*  CO2 '30 30 25 27 26 29 28  ' GLUCOSE 118* 152* 120* 107* 89 78 129*  BUN 102* 36* 53* 69* 82* 31* 50*  CREATININE 4.34* 2.34* 3.13* 3.99* 4.53* 2.48* 3.46*  CALCIUM 7.7* 8.1* 8.0* 8.1* 8.0* 8.1* 7.8*  PHOS 7.0* 3.7 5.2* 5.7* 6.2* 3.6 5.5*   CBC Recent Labs  Lab 04/23/19 1827 04/24/19 0456 04/25/19 0454 04/27/19 0423  WBC 9.9 9.9 9.9 12.5*  HGB 8.6* 8.2* 7.4* 8.1*  HCT 27.2* 26.5* 23.6* 25.5*  MCV 94.8 94.3 93.7 93.4  PLT 204 190 145* 160    Medications:    . amiodarone  200 mg Oral Daily  . aspirin  81 mg Oral Daily  . atorvastatin  80 mg Oral q1800  . chlorhexidine gluconate (MEDLINE KIT)  15 mL Mouth Rinse BID  . Chlorhexidine Gluconate Cloth  6 each Topical Q0600  . Chlorhexidine Gluconate Cloth  6 each Topical Q0600  . darbepoetin (ARANESP) injection - NON-DIALYSIS  100 mcg Subcutaneous Q Tue-1800  . feeding supplement (NEPRO CARB STEADY)  237 mL Oral BID BM  . feeding supplement (PRO-STAT SUGAR FREE 64)  30 mL Per Tube BID  . Gerhardt's butt cream   Topical BID  . hydrocortisone   Rectal BID  . insulin aspart  0-24 Units Subcutaneous Q4H  . insulin glargine  36 Units Subcutaneous Daily  . mouth rinse  15 mL Mouth Rinse 10 times per day  . midodrine  15 mg Oral TID WC  . multivitamin  1 tablet Oral QHS  . pantoprazole (PROTONIX) IV  40 mg Intravenous Q24H  .  simethicone  80 mg Oral BID PC  . sodium chloride flush  10-40 mL Intracatheter Q12H      Trinitie Mcgirr A Jeffory Snelgrove  04/28/2019, 6:50 AM

## 2019-04-28 NOTE — Progress Notes (Signed)
Patient trach removed per MD order.  Gauze placed over stoma site and cloth, paper tape placed over top.  Patient tolerated well with no complications.  Sats currently 98% on nasal cannula.  Will continue to monitor.

## 2019-04-28 NOTE — Progress Notes (Signed)
Patient ID: Jesse Murphy, male   DOB: April 01, 1928, 86 y.o.   MRN: [id number] f    Advanced Heart Failure Rounding Note  PCP-Cardiologist: Kate Sable, MD   Subjective:    Impella removed 2/23. Intraoperative TEE showed EF ~40%  Had tunneled cath placed 3/9 and left PleurX tube.   Has tolerated intermittent hemodialysis on 3/10 and again yesterday.  Removed 2 L.  Pressure was stable off pressors.  He remains on midodrine.  He is doing well on trach collar.  CCM hopeful that he may be able to be decannulated soon.  Continues with hematuria.  Remains on CBI.  Denies chest pain or shortness of breath.  Says his right foot is sore  Objective:   Weight Range: 87.1 kg Body mass index is 29.2 kg/m.   Vital Signs:   Temp:  [97.7 F (36.5 C)-99 F (37.2 C)] 98.2 F (36.8 C) (03/13 1114) Pulse Rate:  [66-126] 69 (03/13 1155) Resp:  [13-26] 21 (03/13 1155) BP: (79-117)/(32-65) 104/42 (03/13 1100) SpO2:  [85 %-100 %] 97 % (03/13 1155) Last BM Date: 04/27/19  Weight change: Filed Weights   04/26/19 0400 04/27/19 0600 04/27/19 1125  Weight: 85.8 kg 87.1 kg 87.1 kg    Intake/Output:   Intake/Output Summary (Last 24 hours) at 04/28/2019 1219 Last data filed at 04/28/2019 1100 Gross per 24 hour  Intake 40020 ml  Output 42425 ml  Net -2405 ml      Physical Exam   General:  Lying in bed weak  On TC.  Speaking with Passy-Muir valve HEENT: normal Neck: supple. no JVD.  Plus trach collar carotids 2+ bilat; no bruits. No lymphadenopathy or thryomegaly appreciated. Cor: PMI nondisplaced. Regular rate & rhythm. No rubs, gallops or murmurs. Lungs: clear decreased at the bases. Abdomen: soft, nontender, nondistended. No hepatosplenomegaly. No bruits or masses. Good bowel sounds. Extremities: no cyanosis, clubbing, rash,trace- 1+ edema Foley draining bloody urine. Neuro: alert & orientedx3, cranial nerves grossly intact. moves all 4 extremities w/o difficulty. Affect  pleasant  Telemetry   NSR 60-70s Personally reviewed   Labs    CBC Recent Labs    04/27/19 0423  WBC 12.5*  HGB 8.1*  HCT 25.5*  MCV 93.4  PLT 567   Basic Metabolic Panel Recent Labs    04/27/19 0423 04/27/19 0423 04/27/19 1641 04/28/19 0516  NA 137   < > 139 136  K 5.4*   < > 3.7 4.6  CL 96*   < > 100 97*  CO2 26   < > 29 28  GLUCOSE 89   < > 78 129*  BUN 82*   < > 31* 50*  CREATININE 4.53*   < > 2.48* 3.46*  CALCIUM 8.0*   < > 8.1* 7.8*  MG 2.0  --   --  1.8  PHOS 6.2*   < > 3.6 5.5*   < > = values in this interval not displayed.   Liver Function Tests Recent Labs    04/27/19 1641 04/28/19 0516  ALBUMIN 2.2* 2.0*   No results for input(s): LIPASE, AMYLASE in the last 72 hours. Cardiac Enzymes No results for input(s): CKTOTAL, CKMB, CKMBINDEX, TROPONINI in the last 72 hours.  BNP: BNP (last 3 results) Recent Labs    03/22/19 0401 03/26/19 0626 03/27/19 1255  BNP 340.4* 687.5* 800.4*    ProBNP (last 3 results) No results for input(s): PROBNP in the last 8760 hours.   D-Dimer No results for input(s): DDIMER in the  last 72 hours. Hemoglobin A1C No results for input(s): HGBA1C in the last 72 hours. Fasting Lipid Panel No results for input(s): CHOL, HDL, LDLCALC, TRIG, CHOLHDL, LDLDIRECT in the last 72 hours. Thyroid Function Tests No results for input(s): TSH, T4TOTAL, T3FREE, THYROIDAB in the last 72 hours.  Invalid input(s): FREET3  Other results:   Imaging    No results found.   Medications:     Scheduled Medications: . amiodarone  200 mg Oral Daily  . aspirin  81 mg Oral Daily  . atorvastatin  80 mg Oral q1800  . chlorhexidine gluconate (MEDLINE KIT)  15 mL Mouth Rinse BID  . Chlorhexidine Gluconate Cloth  6 each Topical Q0600  . Chlorhexidine Gluconate Cloth  6 each Topical Q0600  . darbepoetin (ARANESP) injection - NON-DIALYSIS  100 mcg Subcutaneous Q Tue-1800  . feeding supplement (NEPRO CARB STEADY)  237 mL Oral BID BM   . feeding supplement (PRO-STAT SUGAR FREE 64)  30 mL Per Tube BID  . Gerhardt's butt cream   Topical BID  . hydrocortisone   Rectal BID  . insulin aspart  0-24 Units Subcutaneous Q4H  . insulin glargine  36 Units Subcutaneous Daily  . mouth rinse  15 mL Mouth Rinse 10 times per day  . midodrine  15 mg Oral TID WC  . multivitamin  1 tablet Oral QHS  . pantoprazole (PROTONIX) IV  40 mg Intravenous Q24H  . simethicone  80 mg Oral BID PC  . sodium chloride flush  10-40 mL Intracatheter Q12H    Infusions: . sodium chloride    . sodium chloride    . sodium chloride    . feeding supplement (JEVITY 1.5 CAL/FIBER) 1,000 mL (04/27/19 2030)  . magnesium sulfate bolus IVPB    . norepinephrine (LEVOPHED) Adult infusion Stopped (04/24/19 1800)    PRN Medications: sodium chloride, sodium chloride, sodium chloride, oxyCODONE **AND** acetaminophen, alteplase, dextrose, heparin, heparin, hyoscyamine, levalbuterol, lidocaine (PF), lidocaine-prilocaine, loperamide HCl, ondansetron (ZOFRAN) IV, pentafluoroprop-tetrafluoroeth, promethazine, Resource ThickenUp Clear, sodium chloride flush, sodium chloride flush    Assessment/Plan   1. Acute systolic HF -> Cardiogenic shock - post-op echo on 03/20/19 EF 25-30% (pre-op 30-35%. In 12/2017 EF normal) - Required Impella support post CABG. Impella removed 2/23. Post-op TEE EF 40% - volume status mildly elevated.  - tolerated iHD on 3/10 & 3/12 off pressors - for iHD again monday  2. CAD s/p CABG this admit (LIMA->LAD, SVG->OM, SVG->RCA on 03/15/19) - No s/s of ischemia. - Continue ASA +statin  3. Acute hypoxic respiratory failure with Pseudomonas PNA - s/p trach on 03/27/19 - completed meropenem for pseudomonas PNA - trach aspirate now growing Pseudomonas again.   - now on cipro.  We will continue for a total of 14 days.  Discussed with pharmD again today. - stable on TC.  CCM hoping to be able to decannulate soon -  4. PAF - s/p MAZE and LAA  occlusion - Has been in/out AF. - Now maintaining NSR on po amio.  We will continue.  5. AKI  - due to shock/ATN - On CVVHD. Making minimal urine - Tunneled cath placed 3/9  - He tolerated intermittent hemodialysis on 3/10 and 3/12 - Plan for iHD again Monday. -If tolerates need to consider possible permanent access.   6. Hematuria -Ongoing.  Remains on CBI. - If not resolved by Monday Urology wil take to OR for cysto.  No change.  7. Melena - Resolved  8. Anemia - No evidence of  active bleeding  Transfuse to keep hgb >= 7.5    9. Debility, severe - continue P  10. F/E/N - Tube feeds per Cor-trak.  Tolerating well  12. Left Pleural Effusion  - moderate to large. - s/p PleurX on 3/8.  -Remains small on chest x-ray today.  Overall remains tenuous but improving.  He seems to be tolerating intermittent HD.  We will try again on Monday.  If tolerates be worth considering permanent access.  Remains in normal sinus rhythm on amiodarone.  Likely will go to the OR on Monday for cystoscopy due to ongoing hematuria.  Continue aggressive rehab efforts.  Glori Bickers, MD  12:19 PM

## 2019-04-28 NOTE — Plan of Care (Signed)
  Problem: Education: Goal: Knowledge of General Education information will improve Description: Including pain rating scale, medication(s)/side effects and non-pharmacologic comfort measures Outcome: Progressing   Problem: Clinical Measurements: Goal: Respiratory complications will improve Outcome: Progressing Goal: Cardiovascular complication will be avoided Outcome: Progressing   Problem: Activity: Goal: Risk for activity intolerance will decrease Outcome: Progressing   

## 2019-04-28 NOTE — Progress Notes (Signed)
Pt's K increased from 3.7 to 5.2 in 24 hours and is not receiving HD again until Monday. Occasional ventricular ectopy noted. On-call cardiology MD notified; will continue to monitor and will update with AM labs.

## 2019-04-29 LAB — CBC
HCT: 24.2 % — ABNORMAL LOW (ref 39.0–52.0)
Hemoglobin: 7.5 g/dL — ABNORMAL LOW (ref 13.0–17.0)
MCH: 29.3 pg (ref 26.0–34.0)
MCHC: 31 g/dL (ref 30.0–36.0)
MCV: 94.5 fL (ref 80.0–100.0)
Platelets: 161 10*3/uL (ref 150–400)
RBC: 2.56 MIL/uL — ABNORMAL LOW (ref 4.22–5.81)
RDW: 16.9 % — ABNORMAL HIGH (ref 11.5–15.5)
WBC: 11.2 10*3/uL — ABNORMAL HIGH (ref 4.0–10.5)
nRBC: 0 % (ref 0.0–0.2)

## 2019-04-29 LAB — RENAL FUNCTION PANEL
Albumin: 2 g/dL — ABNORMAL LOW (ref 3.5–5.0)
Anion gap: 11 (ref 5–15)
BUN: 76 mg/dL — ABNORMAL HIGH (ref 8–23)
CO2: 28 mmol/L (ref 22–32)
Calcium: 8 mg/dL — ABNORMAL LOW (ref 8.9–10.3)
Chloride: 96 mmol/L — ABNORMAL LOW (ref 98–111)
Creatinine, Ser: 4.58 mg/dL — ABNORMAL HIGH (ref 0.61–1.24)
GFR calc Af Amer: 12 mL/min — ABNORMAL LOW (ref >60)
GFR calc non Af Amer: 11 mL/min — ABNORMAL LOW (ref >60)
Glucose, Bld: 108 mg/dL — ABNORMAL HIGH (ref 70–99)
Phosphorus: 7.4 mg/dL — ABNORMAL HIGH (ref 2.5–4.6)
Potassium: 5 mmol/L (ref 3.5–5.1)
Sodium: 135 mmol/L (ref 135–145)

## 2019-04-29 LAB — MAGNESIUM: Magnesium: 2.1 mg/dL (ref 1.7–2.4)

## 2019-04-29 LAB — GLUCOSE, CAPILLARY
Glucose-Capillary: 112 mg/dL — ABNORMAL HIGH (ref 70–99)
Glucose-Capillary: 126 mg/dL — ABNORMAL HIGH (ref 70–99)
Glucose-Capillary: 124 mg/dL — ABNORMAL HIGH (ref 70–99)
Glucose-Capillary: 119 mg/dL — ABNORMAL HIGH (ref 70–99)
Glucose-Capillary: 126 mg/dL — ABNORMAL HIGH (ref 70–99)

## 2019-04-29 LAB — PREPARE RBC (CROSSMATCH)

## 2019-04-29 MED ORDER — HYOSCYAMINE SULFATE 0.125 MG SL SUBL
0.1250 mg | SUBLINGUAL_TABLET | Freq: Four times a day (QID) | SUBLINGUAL | Status: DC | PRN
  Filled 2019-04-29: qty 1

## 2019-04-29 MED ORDER — CHLORHEXIDINE GLUCONATE CLOTH 2 % EX PADS
6.0000 | MEDICATED_PAD | Freq: Every day | CUTANEOUS | Status: DC
  Administered 2019-04-30 – 2019-05-02 (×3): 6 via TOPICAL

## 2019-04-29 MED ORDER — CALCIUM ACETATE (PHOS BINDER) 667 MG PO CAPS
667.0000 mg | ORAL_CAPSULE | Freq: Three times a day (TID) | ORAL | Status: DC
  Administered 2019-04-29 – 2019-05-04 (×15): 667 mg via ORAL
  Filled 2019-04-29 (×15): qty 1

## 2019-04-29 MED ORDER — MELATONIN 3 MG PO TABS
3.0000 mg | ORAL_TABLET | Freq: Every evening | ORAL | Status: DC | PRN
  Administered 2019-04-29 – 2019-05-01 (×2): 3 mg via ORAL
  Filled 2019-04-29 (×3): qty 1

## 2019-04-29 NOTE — Progress Notes (Signed)
6 Days Post-Op Procedure(s) (LRB): INSERTION PLEURAL DRAINAGE CATHETER TO DRAIN LEFT PLEURAL EFFUSION (Left) Subjective: No complaints  Objective: Vital signs in last 24 hours: Temp:  [97.5 F (36.4 C)-98.1 F (36.7 C)] 97.5 F (36.4 C) (03/14 1200) Pulse Rate:  [65-76] 65 (03/14 1400) Cardiac Rhythm: Normal sinus rhythm (03/14 0800) Resp:  [14-29] 14 (03/14 1400) BP: (99-126)/(36-50) 106/40 (03/14 1400) SpO2:  [94 %-99 %] 96 % (03/14 1400) Weight:  [87.5 kg] 87.5 kg (03/14 0500)  Hemodynamic parameters for last 24 hours: CVP:  [4 mmHg-10 mmHg] 5 mmHg  Intake/Output from previous day: 03/13 0701 - 03/14 0700 In: 07371 [P.O.:300; NG/GT:1500] Out: 39050 [Urine:39050] Intake/Output this shift: Total I/O In: 9540 [Other:9000; NG/GT:540] Out: 7525 [Urine:7525]  General appearance: alert and no distress Neurologic: intact Heart: irregularly irregular rhythm Lungs: clear to auscultation bilaterally Wound: c/d/i  Lab Results: Recent Labs    04/27/19 0423 04/29/19 0451  WBC 12.5* 11.2*  HGB 8.1* 7.5*  HCT 25.5* 24.2*  PLT 160 161   BMET:  Recent Labs    04/28/19 1720 04/29/19 0449  NA 134* 135  K 5.2* 5.0  CL 96* 96*  CO2 28 28  GLUCOSE 120* 108*  BUN 64* 76*  CREATININE 4.04* 4.58*  CALCIUM 7.9* 8.0*    PT/INR: No results for input(s): LABPROT, INR in the last 72 hours. ABG    Component Value Date/Time   PHART 7.488 (H) 04/07/2019 0405   HCO3 25.9 04/07/2019 0405   TCO2 36 (H) 04/23/2019 0449   ACIDBASEDEF 1.0 04/05/2019 0540   O2SAT 67.8 04/14/2019 1253   CBG (last 3)  Recent Labs    04/29/19 0332 04/29/19 0803 04/29/19 1143  GLUCAP 112* 126* 124*    Assessment/Plan: S/P Procedure(s) (LRB): INSERTION PLEURAL DRAINAGE CATHETER TO DRAIN LEFT PLEURAL EFFUSION (Left) Continue present management   LOS: 46 days    Wonda Olds 04/29/2019

## 2019-04-29 NOTE — Progress Notes (Addendum)
Patient ID: Jesse Murphy, male   DOB: 1928/03/03, 86 y.o.   MRN: [id number]    Advanced Heart Failure Rounding Note  PCP-Cardiologist: Kate Sable, MD   Subjective:    Events - Impella removed 2/23. Intraoperative TEE showed EF ~40% - Had tunneled cath placed 3/9 and left PleurX tube.  - Tolerated iHD on 3/10 and 3/12 - Trach decannulated 3/13  This trach was decannulated yesterday.  Tolerating well.  Denies shortness of breath.  Hematuria seems to have slowed.  Remains on CBI.  Urology planning for cystoscopy tomorrow.  Systolic blood pressure stable on midodrine.  Remains in normal sinus rhythm on p.o. amiodarone.  Objective:   Weight Range: 87.5 kg Body mass index is 29.33 kg/m.   Vital Signs:   Temp:  [97.5 F (36.4 C)-98.1 F (36.7 C)] 97.5 F (36.4 C) (03/14 1200) Pulse Rate:  [66-76] 66 (03/14 1200) Resp:  [16-29] 16 (03/14 1200) BP: (99-126)/(36-50) 112/42 (03/14 1200) SpO2:  [95 %-99 %] 99 % (03/14 1200) Weight:  [87.5 kg] 87.5 kg (03/14 0500) Last BM Date: 04/28/19  Weight change: Filed Weights   04/27/19 0600 04/27/19 1125 04/29/19 0500  Weight: 87.1 kg 87.1 kg 87.5 kg    Intake/Output:   Intake/Output Summary (Last 24 hours) at 04/29/2019 1319 Last data filed at 04/29/2019 1300 Gross per 24 hour  Intake 40960 ml  Output 33575 ml  Net 7385 ml      Physical Exam   General:  Lying in bed weak No resp difficulty HEENT: normal Neck: supple. JVP 7-8 + trach site ok (decannualted) Carotids 2+ bilat; no bruits. No lymphadenopathy or thryomegaly appreciated. Cor: PMI nondisplaced. Regular rate & rhythm. No rubs, gallops or murmurs. Lungs: Coarse Abdomen: soft, nontender, nondistended. No hepatosplenomegaly. No bruits or masses. Good bowel sounds. Extremities: no cyanosis, clubbing, rash, 1+ edema Neuro: alert & orientedx3, cranial nerves grossly intact. moves all 4 extremities w/o difficulty. Affect pleasant   Telemetry   NSR 60-70ss  Personally reviewed   Labs    CBC Recent Labs    04/27/19 0423 04/29/19 0451  WBC 12.5* 11.2*  HGB 8.1* 7.5*  HCT 25.5* 24.2*  MCV 93.4 94.5  PLT 160 456   Basic Metabolic Panel Recent Labs    04/28/19 0516 04/28/19 0516 04/28/19 1720 04/29/19 0449 04/29/19 0451  NA 136   < > 134* 135  --   K 4.6   < > 5.2* 5.0  --   CL 97*   < > 96* 96*  --   CO2 28   < > 28 28  --   GLUCOSE 129*   < > 120* 108*  --   BUN 50*   < > 64* 76*  --   CREATININE 3.46*   < > 4.04* 4.58*  --   CALCIUM 7.8*   < > 7.9* 8.0*  --   MG 1.8  --   --   --  2.1  PHOS 5.5*   < > 6.5* 7.4*  --    < > = values in this interval not displayed.   Liver Function Tests Recent Labs    04/28/19 1720 04/29/19 0449  ALBUMIN 2.1* 2.0*   No results for input(s): LIPASE, AMYLASE in the last 72 hours. Cardiac Enzymes No results for input(s): CKTOTAL, CKMB, CKMBINDEX, TROPONINI in the last 72 hours.  BNP: BNP (last 3 results) Recent Labs    03/22/19 0401 03/26/19 0626 03/27/19 1255  BNP 340.4* 687.5* 800.4*  ProBNP (last 3 results) No results for input(s): PROBNP in the last 8760 hours.   D-Dimer No results for input(s): DDIMER in the last 72 hours. Hemoglobin A1C No results for input(s): HGBA1C in the last 72 hours. Fasting Lipid Panel No results for input(s): CHOL, HDL, LDLCALC, TRIG, CHOLHDL, LDLDIRECT in the last 72 hours. Thyroid Function Tests No results for input(s): TSH, T4TOTAL, T3FREE, THYROIDAB in the last 72 hours.  Invalid input(s): FREET3  Other results:   Imaging    No results found.   Medications:     Scheduled Medications: . amiodarone  200 mg Oral Daily  . aspirin  81 mg Oral Daily  . atorvastatin  80 mg Oral q1800  . calcium acetate  667 mg Oral TID WC  . chlorhexidine  15 mL Mouth Rinse BID  . Chlorhexidine Gluconate Cloth  6 each Topical Daily  . darbepoetin (ARANESP) injection - NON-DIALYSIS  100 mcg Subcutaneous Q Tue-1800  . feeding supplement  (NEPRO CARB STEADY)  237 mL Oral BID BM  . feeding supplement (PRO-STAT SUGAR FREE 64)  30 mL Per Tube BID  . Gerhardt's butt cream   Topical BID  . hydrocortisone   Rectal BID  . insulin aspart  0-24 Units Subcutaneous Q4H  . insulin glargine  36 Units Subcutaneous Daily  . mouth rinse  15 mL Mouth Rinse q12n4p  . midodrine  15 mg Oral TID WC  . multivitamin  1 tablet Oral QHS  . pantoprazole (PROTONIX) IV  40 mg Intravenous Q24H  . simethicone  80 mg Oral BID PC  . sodium chloride flush  10-40 mL Intracatheter Q12H    Infusions: . sodium chloride    . sodium chloride    . feeding supplement (JEVITY 1.5 CAL/FIBER) 1,000 mL (04/27/19 2030)  . magnesium sulfate bolus IVPB    . norepinephrine (LEVOPHED) Adult infusion Stopped (04/24/19 1800)    PRN Medications: sodium chloride, sodium chloride, oxyCODONE **AND** acetaminophen, alteplase, dextrose, heparin, heparin, hyoscyamine, levalbuterol, lidocaine (PF), lidocaine-prilocaine, loperamide HCl, Melatonin, ondansetron (ZOFRAN) IV, pentafluoroprop-tetrafluoroeth, promethazine, Resource ThickenUp Clear, sodium chloride flush, sodium chloride flush    Assessment/Plan   1. Acute systolic HF -> Cardiogenic shock - post-op echo on 03/20/19 EF 25-30% (pre-op 30-35%. In 12/2017 EF normal) - Required Impella support post CABG. Impella removed 2/23. Post-op TEE EF 40% - volume status mildly elevated.  But weight stable. - tolerated iHD on 3/10 & 3/12 off pressors -For intermittent hemodialysis again tomorrow.  2. CAD s/p CABG this admit (LIMA->LAD, SVG->OM, SVG->RCA on 03/15/19) -No signs and symptoms of ischemia - Continue ASA +statin  3. Acute hypoxic respiratory failure with Pseudomonas PNA - s/p trach on 03/27/19 - completed meropenem for pseudomonas PNA - trach aspirate now growing Pseudomonas again.   - now on cipro. We had planned to treat for 14 days (after talking with ID pharmD) but was stopped on 3/12 after 6 days as it was felt  to be colonization and not active infection.  Will d/w Dr. Prescott Gum in am.  -Trach decannulated 3/13.  Tolerating well.  4. PAF - s/p MAZE and LAA occlusion - Has been in/out AF. - Now maintaining NSR on po amio.  We will continue  5. AKI  - due to shock/ATN - On CVVHD. Making minimal urine - Tunneled cath placed 3/9  - He tolerated intermittent hemodialysis on 3/10 and 3/12 - Plan for iHD again tomorrow morning. -If tolerates need to consider possible permanent access.   6. Hematuria -  Ongoing.  Remains on CBI. -Urology planning cystoscopy tomorrow.  7. Melena - Resolved  8. Anemia - No evidence of active bleeding hemoglobin 7.5 this morning.  Transfuse to keep hgb >= 7.5    9. Debility, severe - continue P  10. F/E/N - Tube feeds per [facility].  Tolerating well  12. Left Pleural Effusion  - moderate to large. - s/p PleurX on 3/8.  -Remains small on chest x-ray  Overall remains tenuous but improving.  He seems to be tolerating intermittent HD.  We will try again tomorrow.  If tolerates be worth considering permanent access especially now that trach is out and it should be easier to arrange for outpatient hemodialysis..  Remains in normal sinus rhythm on amiodarone.  He is on for cystoscopy due to ongoing hematuria.  Continue aggressive rehab efforts.  Glori Bickers, MD  1:19 PM

## 2019-04-29 NOTE — Plan of Care (Signed)

## 2019-04-29 NOTE — Progress Notes (Signed)
[facility] Progress Note    Assessment/ Plan:   1. AKI(Cr was 0.7 prior to surgery)- due to postoperative CHF/cardiogenic - started on CRRT  03/29/19 - 2/24. Attempted IHD on 2/26 with minimal UF --> increased pressors and rapid afib.  Then CRRT on 3/1-- 3/8. Had been ontoleratingnet UF with  CRRT.   BUN and crt rising off CRRT which indicates he will remain HD dep. Tolerated IHD on 3/10 and on 3/12.  Next HD will be slated for Monday 3/15 2. Acute systolic CHF/cardiogenic shock- EF 25-30%; s/p Impella- now out. optimize volume as above. On midodrine  15 mg TID.  off pressors- still overloaded 3. CAD s/p [id number] 2/83/66 complicated by cardiogenic shock and ARF.Postop echo EF 25-30% 4. Acute hypoxic respiratory failure with pseudomonas pneumonia- s/p trach on 03/27/19.s/pabx per PCCM-> completed abx  5. Hematuria- bladder irrigationper urology recommendations-  Still req 6. ABLA- Hx Melena. Transfuse as needed. Have given RRBC's on 2/22 and 2/23. And 2/25. On aranesp 100 weekly- hgb down again  7. Protein malnutrition- moderate to severe- alb 2.0 8. Secondary hyperparathyroidism/bone mineral -  phos rising, will add phoslo -.  pth 116 9. Disposition-  he is a poor longterm dialysis candidate given his cardiogenic shock and trach but did make some progress this week.  Getting trach out will be huge in terms of making him more a candidate for OP HD.  Have not pulled the trigger yet on perm access-  If cont to improve may be a candidate for that  Subjective:   -  Unable to tell UOP due to [facility] out.  Says that he did not sleep well due to bladder spasms.  Also says that every time he goes to eat he vomits ?    Objective:   BP (!) 111/44   Pulse 73   Temp 98 F (36.7 C) (Oral)   Resp 17   Ht 5\' 8"  (1.727 m)   Wt 87.5 kg   SpO2 97%   BMI 29.33 kg/m   Intake/Output Summary (Last 24 hours) at 04/29/2019 2947 Last data filed at 04/29/2019 0600 Gross per  24 hour  Intake 40880 ml  Output 39050 ml  Net 1830 ml   Weight change: 0.4 kg  Physical Exam: Gen: elderly male in bed chronically ill-appearing- alert, responsive HEENT trach removed CVS: S1 S2 no rub Resp:clear;unlabored at rest; trach collar Abd: soft/ NT/ND  Ext:1-2+edema, in thighs as well Neuro - awake and interactive and follows commands GU foley in place withirrigation, clear this AM but he tells me he had clots overnight  Access:  tunneled HD cath left IJ  Imaging: No results found.  Labs: BMET Recent Labs  Lab 04/26/19 0340 04/26/19 1732 04/27/19 0423 04/27/19 1641 04/28/19 0516 04/28/19 1720 04/29/19 0449  NA 135 136 137 139 136 134* 135  K 4.6 4.9 5.4* 3.7 4.6 5.2* 5.0  CL 97* 97* 96* 100 97* 96* 96*  CO2 25 27 26 29 28 28 28   GLUCOSE 120* 107* 89 78 129* 120* 108*  BUN 53* 69* 82* 31* 50* 64* 76*  CREATININE 3.13* 3.99* 4.53* 2.48* 3.46* 4.04* 4.58*  CALCIUM 8.0* 8.1* 8.0* 8.1* 7.8* 7.9* 8.0*  PHOS 5.2* 5.7* 6.2* 3.6 5.5* 6.5* 7.4*   CBC Recent Labs  Lab 04/24/19 0456 04/25/19 0454 04/27/19 0423 04/29/19 0451  WBC 9.9 9.9 12.5* 11.2*  HGB 8.2* 7.4* 8.1* 7.5*  HCT 26.5* 23.6* 25.5* 24.2*  MCV 94.3 93.7 93.4  94.5  PLT 190 145* 160 161    Medications:    . amiodarone  200 mg Oral Daily  . aspirin  81 mg Oral Daily  . atorvastatin  80 mg Oral q1800  . chlorhexidine  15 mL Mouth Rinse BID  . Chlorhexidine Gluconate Cloth  6 each Topical Daily  . darbepoetin (ARANESP) injection - NON-DIALYSIS  100 mcg Subcutaneous Q Tue-1800  . feeding supplement (NEPRO CARB STEADY)  237 mL Oral BID BM  . feeding supplement (PRO-STAT SUGAR FREE 64)  30 mL Per Tube BID  . Gerhardt's butt cream   Topical BID  . hydrocortisone   Rectal BID  . insulin aspart  0-24 Units Subcutaneous Q4H  . insulin glargine  36 Units Subcutaneous Daily  . mouth rinse  15 mL Mouth Rinse 10 times per day  . mouth rinse  15 mL Mouth Rinse q12n4p  . midodrine  15 mg Oral TID  WC  . multivitamin  1 tablet Oral QHS  . pantoprazole (PROTONIX) IV  40 mg Intravenous Q24H  . simethicone  80 mg Oral BID PC  . sodium chloride flush  10-40 mL Intracatheter Q12H      Olimpia Tinch A Eva Griffo  04/29/2019, 6:52 AM

## 2019-04-29 NOTE — Progress Notes (Signed)
Urology Inpatient Progress Report  Atrial fibrillation with rapid ventricular response (HCC) [I48.91] Atrial fibrillation with RVR (HCC) [I48.91] S/P CABG x 3 [Z95.1]  Procedure(s): INSERTION PLEURAL DRAINAGE CATHETER TO DRAIN LEFT PLEURAL EFFUSION  6 Days Post-Op   Intv/Subj: No acute events overnight.  CBI running with need for irrigation x3 last night.  Urine is clear in tubing.  Principal Problem:   Atrial fibrillation with rapid ventricular response (HCC) Active Problems:   PAF (paroxysmal atrial fibrillation) (HCC)   GERD (gastroesophageal reflux disease)   Chronic anticoagulation   CAD S/P percutaneous coronary angioplasty   Type 2 diabetes mellitus without complications (HCC)   Non Hodgkin's lymphoma (HCC)   Chronic diastolic heart failure (HCC)   Hypertension   Atrial fibrillation with RVR (HCC)   Non-ST elevation (NSTEMI) myocardial infarction (HCC)   Acute on chronic combined systolic and diastolic CHF (congestive heart failure) (HCC)   Coronary artery disease due to lipid rich plaque   S/P CABG x 3   Acute respiratory failure with hypoxia (HCC)   Pressure injury of skin   Palliative care encounter   AKI (acute kidney injury) ([place])  Current Facility-Administered Medications  Medication Dose Route Frequency Provider Last Rate Last Admin  . 0.9 %  sodium chloride infusion  100 mL Intravenous PRN Corliss Parish, MD      . 0.9 %  sodium chloride infusion  100 mL Intravenous PRN Corliss Parish, MD      . oxyCODONE (ROXICODONE) 5 MG/5ML solution 5 mg  5 mg Oral Q4H PRN Prescott Gum, Collier Salina, MD   5 mg at 04/29/19 5397   And  . acetaminophen (TYLENOL) 160 MG/5ML solution 325 mg  325 mg Oral Q4H PRN Ivin Poot, MD   325 mg at 04/23/19 0726  . alteplase (CATHFLO ACTIVASE) injection 2 mg  2 mg Intracatheter Once PRN Corliss Parish, MD      . amiodarone (PACERONE) tablet 200 mg  200 mg Oral Daily Bensimhon, Shaune Pascal, MD   200 mg at 04/29/19 1030  .  aspirin chewable tablet 81 mg  81 mg Oral Daily Prescott Gum, Collier Salina, MD   81 mg at 04/29/19 1028  . atorvastatin (LIPITOR) tablet 80 mg  80 mg Oral q1800 Prescott Gum, Collier Salina, MD   80 mg at 04/28/19 1759  . calcium acetate (PHOSLO) capsule 667 mg  667 mg Oral TID WC Corliss Parish, MD   667 mg at 04/29/19 0813  . chlorhexidine (PERIDEX) 0.12 % solution 15 mL  15 mL Mouth Rinse BID Prescott Gum, Collier Salina, MD   15 mL at 04/29/19 1000  . Chlorhexidine Gluconate Cloth 2 % PADS 6 each  6 each Topical Daily Ivin Poot, MD   6 each at 04/29/19 1000  . Darbepoetin Alfa (ARANESP) injection 100 mcg  100 mcg Subcutaneous Q Tue-1800 Corliss Parish, MD   100 mcg at 04/24/19 2101  . dextrose 50 % solution 0-50 mL  0-50 mL Intravenous PRN Prescott Gum, Collier Salina, MD      . feeding supplement (JEVITY 1.5 CAL/FIBER) liquid 1,000 mL  1,000 mL Oral Continuous Prescott Gum, Collier Salina, MD 40 mL/hr at 04/27/19 2030 1,000 mL at 04/27/19 2030  . feeding supplement (NEPRO CARB STEADY) liquid 237 mL  237 mL Oral BID BM Prescott Gum, Collier Salina, MD   237 mL at 04/29/19 1029  . feeding supplement (PRO-STAT SUGAR FREE 64) liquid 30 mL  30 mL Per Tube BID Prescott Gum, Collier Salina, MD   30 mL at 04/29/19  1028  . Gerhardt's butt cream   Topical BID Prescott Gum, Collier Salina, MD   1 application at 72/53/66 1037  . heparin injection 1,000 Units  1,000 Units Dialysis PRN Corliss Parish, MD   3,800 Units at 04/27/19 1454  . heparin injection 1,700 Units  20 Units/kg Dialysis PRN Corliss Parish, MD   1,700 Units at 04/27/19 1202  . hydrocortisone (ANUSOL-HC) 2.5 % rectal cream   Rectal BID Ivin Poot, MD   1 application at 44/03/47 1038  . hyoscyamine (LEVSIN SL) SL tablet 0.125 mg  0.125 mg Sublingual Q6H PRN Prescott Gum, Collier Salina, MD      . insulin aspart (novoLOG) injection 0-24 Units  0-24 Units Subcutaneous Q4H Ivin Poot, MD   2 Units at 04/29/19 [phone]  . insulin glargine (LANTUS) injection 36 Units  36 Units Subcutaneous Daily Prescott Gum, Collier Salina,  MD   36 Units at 04/29/19 1037  . levalbuterol (XOPENEX) nebulizer solution 0.63 mg  0.63 mg Nebulization Q6H PRN Ivin Poot, MD   0.63 mg at 04/26/19 1353  . lidocaine (PF) (XYLOCAINE) 1 % injection 5 mL  5 mL Intradermal PRN Corliss Parish, MD      . lidocaine-prilocaine (EMLA) cream 1 application  1 application Topical PRN Prescott Gum, Collier Salina, MD      . loperamide HCl (IMODIUM) 1 MG/7.5ML suspension 4 mg  4 mg Oral PRN Ivin Poot, MD   4 mg at 04/26/19 2044  . magnesium sulfate IVPB 2 g 50 mL  2 g Intravenous Once Ivin Poot, MD      . MEDLINE mouth rinse  15 mL Mouth Rinse q12n4p Prescott Gum, Collier Salina, MD      . Melatonin TABS 3 mg  3 mg Oral QHS PRN Nila Nephew, MD   3 mg at 04/29/19 0041  . midodrine (PROAMATINE) tablet 15 mg  15 mg Oral TID WC Ivin Poot, MD   15 mg at 04/29/19 0813  . multivitamin (RENA-VIT) tablet 1 tablet  1 tablet Oral QHS Ivin Poot, MD   1 tablet at 04/28/19 2128  . norepinephrine (LEVOPHED) 16 mg in 238mL premix infusion  2-10 mcg/min Intravenous Titrated Ivin Poot, MD   Stopped at 04/24/19 1800  . ondansetron (ZOFRAN) injection 4 mg  4 mg Intravenous Q6H PRN Ivin Poot, MD   4 mg at 04/28/19 1759  . pantoprazole (PROTONIX) injection 40 mg  40 mg Intravenous Q24H Prescott Gum, Collier Salina, MD   40 mg at 04/28/19 1759  . pentafluoroprop-tetrafluoroeth (GEBAUERS) aerosol 1 application  1 application Topical PRN Corliss Parish, MD      . promethazine (PHENERGAN) injection 6.25 mg  6.25 mg Intravenous Q6H PRN Prescott Gum, Collier Salina, MD   6.25 mg at 04/24/19 0841  . Resource ThickenUp Clear   Oral PRN Ivin Poot, MD   Given at 04/28/19 [phone]  . simethicone (MYLICON) chewable tablet 80 mg  80 mg Oral BID PC Prescott Gum, Collier Salina, MD   80 mg at 04/29/19 0813  . sodium chloride flush (NS) 0.9 % injection 10-40 mL  10-40 mL Intracatheter Q12H Prescott Gum, Collier Salina, MD   10 mL at 04/29/19 1029  . sodium chloride flush (NS) 0.9 % injection 10-40  mL  10-40 mL Intracatheter PRN Prescott Gum, Collier Salina, MD      . sodium chloride flush (NS) 0.9 % injection 3 mL  3 mL Intravenous PRN Prescott Gum, Collier Salina, MD   3 mL at 04/16/19 [phone]  Objective: Vital: Vitals:   04/29/19 0500 04/29/19 0600 04/29/19 0700 04/29/19 0800  BP: (!) 111/43 (!) 111/44 (!) 126/50 (!) 105/39  Pulse: 72 73 74 69  Resp: 17 17 16  (!) 29  Temp:    97.8 F (36.6 C)  TempSrc:      SpO2: 98% 97% 97% 98%  Weight: 87.5 kg     Height:       I/Os: I/O last 3 completed shifts: In: 35329 [P.O.:450; JMEQA:83419; NG/GT:1850] Out: 62229 [Urine:57200]  Physical Exam:  General: Patient is in no apparent distress Lungs: Normal respiratory effort, chest expands symmetrically. GI: soft and nontender without mass. Foley: Three-way Foley draining clear urine with no clots; CBI chamber completely occluded and unable to see rate, after air was put back in the chamber CBI is running wide open, this was slowed to a fast drip Ext: lower extremities symmetric  Lab Results: Recent Labs    04/27/19 0423 04/29/19 0451  WBC 12.5* 11.2*  HGB 8.1* 7.5*  HCT 25.5* 24.2*   Recent Labs    04/28/19 0516 04/28/19 1720 04/29/19 0449  NA 136 134* 135  K 4.6 5.2* 5.0  CL 97* 96* 96*  CO2 28 28 28   GLUCOSE 129* 120* 108*  BUN 50* 64* 76*  CREATININE 3.46* 4.04* 4.58*  CALCIUM 7.8* 7.9* 8.0*   No results for input(s): LABPT, INR in the last 72 hours. No results for input(s): LABURIN in the last 72 hours. Results for orders placed or performed during the hospital encounter of 03/13/19  Respiratory Panel by RT PCR (Flu A&B, Covid) - Nasopharyngeal Swab     Status: None   Collection Time: 03/13/19  1:49 PM   Specimen: Nasopharyngeal Swab  Result Value Ref Range Status   SARS Coronavirus 2 by RT PCR NEGATIVE NEGATIVE Final    Comment: (NOTE) SARS-CoV-2 target nucleic acids are NOT DETECTED. The SARS-CoV-2 RNA is generally detectable in upper respiratoy specimens during the acute  phase of infection. The lowest concentration of SARS-CoV-2 viral copies this assay can detect is 131 copies/mL. A negative result does not preclude SARS-Cov-2 infection and should not be used as the sole basis for treatment or other patient management decisions. A negative result may occur with  improper specimen collection/handling, submission of specimen other than nasopharyngeal swab, presence of viral mutation(s) within the areas targeted by this assay, and inadequate number of viral copies (<131 copies/mL). A negative result must be combined with clinical observations, patient history, and epidemiological information. The expected result is Negative. Fact Sheet for Patients:  PinkCheek.be Fact Sheet for Healthcare Providers:  GravelBags.it This test is not yet ap proved or cleared by the [place] FDA and  has been authorized for detection and/or diagnosis of SARS-CoV-2 by FDA under an Emergency Use Authorization (EUA). This EUA will remain  in effect (meaning this test can be used) for the duration of the COVID-19 declaration under Section 564(b)(1) of the Act, 21 U.S.C. section 360bbb-3(b)(1), unless the authorization is terminated or revoked sooner.    Influenza A by PCR NEGATIVE NEGATIVE Final   Influenza B by PCR NEGATIVE NEGATIVE Final    Comment: (NOTE) The Xpert Xpress SARS-CoV-2/FLU/RSV assay is intended as an aid in  the diagnosis of influenza from Nasopharyngeal swab specimens and  should not be used as a sole basis for treatment. Nasal washings and  aspirates are unacceptable for Xpert Xpress SARS-CoV-2/FLU/RSV  testing. Fact Sheet for Patients: PinkCheek.be Fact Sheet for Healthcare Providers: GravelBags.it This test  is not yet approved or cleared by the [place] and  has been authorized for detection and/or diagnosis of SARS-CoV-2 by   FDA under an Emergency Use Authorization (EUA). This EUA will remain  in effect (meaning this test can be used) for the duration of the  Covid-19 declaration under Section 564(b)(1) of the Act, 21  U.S.C. section 360bbb-3(b)(1), unless the authorization is  terminated or revoked. Performed at [facility], [address] [place], [place] [address]   MRSA PCR Screening     Status: None   Collection Time: 03/13/19  5:18 PM   Specimen: Nasal Mucosa; Nasopharyngeal  Result Value Ref Range Status   MRSA by PCR NEGATIVE NEGATIVE Final    Comment:        The GeneXpert MRSA Assay (FDA approved for NASAL specimens only), is one component of a comprehensive MRSA colonization surveillance program. It is not intended to diagnose MRSA infection nor to guide or monitor treatment for MRSA infections. Performed at [facility], [address] [place], [place] [address]   Surgical pcr screen     Status: None   Collection Time: 03/15/19  9:28 PM   Specimen: Nasal Mucosa; Nasal Swab  Result Value Ref Range Status   MRSA, PCR NEGATIVE NEGATIVE Final   Staphylococcus aureus NEGATIVE NEGATIVE Final    Comment: (NOTE) The Xpert SA Assay (FDA approved for NASAL specimens in patients 21 years of age and older), is one component of a comprehensive surveillance program. It is not intended to diagnose infection nor to guide or monitor treatment. Performed at [facility], [place] [address] [place], [place] [address]   Culture, respiratory (non-expectorated)     Status: None   Collection Time: 03/19/19  3:00 PM   Specimen: Tracheal Aspirate; Respiratory  Result Value Ref Range Status   Specimen Description TRACHEAL ASPIRATE  Final   Special Requests Normal  Final   Gram Stain   Final    ABUNDANT WBC PRESENT,BOTH PMN AND MONONUCLEAR FEW GRAM POSITIVE COCCI RARE GRAM NEGATIVE RODS    Culture   Final    FEW Consistent with normal respiratory flora. Performed at [facility], [place] [address], [place], [place] [address]    Report Status 03/21/2019 FINAL  Final  Culture, respiratory (non-expectorated)     Status: None   Collection Time: 03/26/19  6:29 AM   Specimen: Tracheal Aspirate; Respiratory  Result Value Ref Range Status   Specimen Description TRACHEAL ASPIRATE  Final   Special Requests NONE  Final   Gram Stain   Final    ABUNDANT WBC PRESENT,BOTH PMN AND MONONUCLEAR NO ORGANISMS SEEN Performed at [facility], [address], [place], [place] [address]    Culture FEW PSEUDOMONAS AERUGINOSA  Final   Report Status 03/28/2019 FINAL  Final   Organism ID, Bacteria PSEUDOMONAS AERUGINOSA  Final      Susceptibility   Pseudomonas aeruginosa - MIC*    CEFTAZIDIME 8 SENSITIVE Sensitive     CIPROFLOXACIN <=0.25 SENSITIVE Sensitive     GENTAMICIN 4 SENSITIVE Sensitive     IMIPENEM 2 SENSITIVE Sensitive     * FEW PSEUDOMONAS AERUGINOSA  Culture, respiratory (non-expectorated)     Status: None   Collection Time: 04/03/19 11:08 AM   Specimen: Tracheal Aspirate; Respiratory  Result Value Ref Range Status   Specimen Description TRACHEAL ASPIRATE  Final   Special Requests NONE  Final   Gram Stain   Final  FEW WBC PRESENT, PREDOMINANTLY PMN FEW GRAM NEGATIVE RODS RARE YEAST    Culture   Final    FEW PSEUDOMONAS AERUGINOSA FEW CANDIDA TROPICALIS    Report Status 04/06/2019 FINAL  Final   Organism ID, Bacteria PSEUDOMONAS AERUGINOSA  Final      Susceptibility   Pseudomonas aeruginosa - MIC*    CEFTAZIDIME 32 RESISTANT Resistant     CIPROFLOXACIN <=0.25 SENSITIVE Sensitive     GENTAMICIN 4 SENSITIVE Sensitive     IMIPENEM Value in next row Sensitive      2 SENSITIVEPerformed at [facility], [address] [place], [place] [address]    * FEW PSEUDOMONAS AERUGINOSA  Culture, blood (Routine X 2) w Reflex to ID Panel     Status: None   Collection Time: 04/03/19 12:30 PM   Specimen: BLOOD LEFT HAND  Result Value Ref Range Status   Specimen Description  BLOOD LEFT HAND  Final   Special Requests   Final    BOTTLES DRAWN AEROBIC ONLY Blood Culture results may not be optimal due to an inadequate volume of blood received in culture bottles   Culture   Final    NO GROWTH 5 DAYS Performed at [facility], [place] [address], [place], [place] [address]    Report Status 04/08/2019 FINAL  Final  Culture, blood (Routine X 2) w Reflex to ID Panel     Status: None   Collection Time: 04/03/19 12:35 PM   Specimen: BLOOD LEFT HAND  Result Value Ref Range Status   Specimen Description BLOOD LEFT HAND  Final   Special Requests   Final    BOTTLES DRAWN AEROBIC ONLY Blood Culture results may not be optimal due to an inadequate volume of blood received in culture bottles   Culture   Final    NO GROWTH 5 DAYS Performed at [facility], [place] [address] [place], [place] [address]    Report Status 04/08/2019 FINAL  Final  Culture, respiratory (non-expectorated)     Status: None   Collection Time: 04/07/19 11:10 AM   Specimen: Tracheal Aspirate; Respiratory  Result Value Ref Range Status   Specimen Description TRACHEAL ASPIRATE  Final   Special Requests NONE  Final   Gram Stain   Final    MODERATE WBC PRESENT, PREDOMINANTLY PMN ABUNDANT GRAM NEGATIVE RODS Performed at [facility], [place] [address] [place], [place] [address]    Culture ABUNDANT PSEUDOMONAS AERUGINOSA  Final   Report Status 04/09/2019 FINAL  Final   Organism ID, Bacteria PSEUDOMONAS AERUGINOSA  Final      Susceptibility   Pseudomonas aeruginosa - MIC*    CEFTAZIDIME 16 INTERMEDIATE Intermediate     CIPROFLOXACIN <=0.25 SENSITIVE Sensitive     GENTAMICIN 4 SENSITIVE Sensitive     IMIPENEM 1 SENSITIVE Sensitive     * ABUNDANT PSEUDOMONAS AERUGINOSA  Culture, blood (routine x 2)     Status: None   Collection Time: 04/07/19  2:07 PM   Specimen: BLOOD LEFT HAND  Result Value Ref Range Status   Specimen Description BLOOD LEFT HAND  Final   Special Requests   Final     BOTTLES DRAWN AEROBIC ONLY Blood Culture adequate volume Performed at [facility], [place] [address] [place] [address] [place], [place] [address]    Culture NO GROWTH 5 DAYS  Final   Report Status 04/12/2019 FINAL  Final  Culture, blood (routine x 2)     Status: None   Collection  Time: 04/07/19  2:07 PM   Specimen: BLOOD LEFT HAND  Result Value Ref Range Status   Specimen Description BLOOD LEFT HAND  Final   Special Requests   Final    BOTTLES DRAWN AEROBIC ONLY Blood Culture adequate volume Performed at [facility], [address] [place], [place] [address]    Culture NO GROWTH 5 DAYS  Final   Report Status 04/12/2019 FINAL  Final  Culture, Urine     Status: None   Collection Time: 04/13/19  6:58 PM   Specimen: Urine, Catheterized  Result Value Ref Range Status   Specimen Description URINE, CATHETERIZED  Final   Special Requests NONE  Final   Culture   Final    NO GROWTH Performed at [facility], [address], [place], [place] [address]    Report Status 04/14/2019 FINAL  Final  MRSA PCR Screening     Status: None   Collection Time: 04/15/19 12:53 PM   Specimen: Nasal Mucosa; Nasopharyngeal  Result Value Ref Range Status   MRSA by PCR NEGATIVE NEGATIVE Final    Comment:        The GeneXpert MRSA Assay (FDA approved for NASAL specimens only), is one component of a comprehensive MRSA colonization surveillance program. It is not intended to diagnose MRSA infection nor to guide or monitor treatment for MRSA infections. Performed at [facility], [place] [address] [place], [place] [address]   Culture, respiratory (non-expectorated)     Status: None   Collection Time: 04/20/19 11:35 AM   Specimen: Tracheal Aspirate; Respiratory  Result Value Ref Range Status   Specimen Description TRACHEAL ASPIRATE  Final   Special Requests Normal  Final   Gram Stain   Final    RARE WBC PRESENT, PREDOMINANTLY PMN FEW GRAM NEGATIVE RODS FEW GRAM POSITIVE RODS Performed at [facility], [place] [address], [place], [place] [address]    Culture MODERATE PSEUDOMONAS AERUGINOSA  Final   Report Status 04/23/2019 FINAL  Final   Organism ID, Bacteria PSEUDOMONAS AERUGINOSA  Final      Susceptibility   Pseudomonas aeruginosa - MIC*    CEFTAZIDIME >=64 RESISTANT Resistant     CIPROFLOXACIN <=0.25 SENSITIVE Sensitive     GENTAMICIN 4 SENSITIVE Sensitive     IMIPENEM >=16 RESISTANT Resistant     * MODERATE PSEUDOMONAS AERUGINOSA  Body fluid culture     Status: None   Collection Time: 04/23/19 12:02 PM   Specimen: Pleural, Left; Body Fluid  Result Value Ref Range Status   Specimen Description PLEURAL LEFT  Final   Special Requests NONE  Final   Gram Stain   Final    ABUNDANT WBC PRESENT,BOTH PMN AND MONONUCLEAR NO ORGANISMS SEEN    Culture   Final    NO GROWTH 3 DAYS Performed at [facility], [address], [place], [place] [address]    Report Status 04/26/2019 FINAL  Final    Assessment/Plan:  86 year old man with a history of CAD status post CABG on 03/15/2019 now with CHF and intermittent gross hematuria on CBI.  -CBI is running well and urine is clear -Plan for OR with Dr. Jeffie Pollock tomorrow for cystoscopy with possible clot evacuation and fulguration -Please make n.p.o. at midnight   Jacalyn Lefevre, MD Urology 04/29/2019, 10:52 AM

## 2019-04-30 ENCOUNTER — Inpatient Hospital Stay (HOSPITAL_COMMUNITY): Payer: Medicare Other | Admitting: Anesthesiology

## 2019-04-30 ENCOUNTER — Inpatient Hospital Stay (HOSPITAL_COMMUNITY): Payer: Medicare Other

## 2019-04-30 ENCOUNTER — Encounter (HOSPITAL_COMMUNITY): Payer: Self-pay | Admitting: Cardiothoracic Surgery

## 2019-04-30 ENCOUNTER — Encounter (HOSPITAL_COMMUNITY)
Admission: EM | Disposition: A | Discharge status: Rehabilitation Facility | Payer: Self-pay | Source: Home / Self Care | Attending: Cardiothoracic Surgery

## 2019-04-30 HISTORY — PX: CYSTOSCOPY WITH FULGERATION: SHX6638

## 2019-04-30 LAB — RENAL FUNCTION PANEL
Albumin: 2.1 g/dL — ABNORMAL LOW (ref 3.5–5.0)
Albumin: 2 g/dL — ABNORMAL LOW (ref 3.5–5.0)
Anion gap: 14 (ref 5–15)
Anion gap: 15 (ref 5–15)
BUN: 94 mg/dL — ABNORMAL HIGH (ref 8–23)
BUN: 98 mg/dL — ABNORMAL HIGH (ref 8–23)
CO2: 27 mmol/L (ref 22–32)
CO2: 24 mmol/L (ref 22–32)
Calcium: 7.9 mg/dL — ABNORMAL LOW (ref 8.9–10.3)
Calcium: 7.8 mg/dL — ABNORMAL LOW (ref 8.9–10.3)
Chloride: 91 mmol/L — ABNORMAL LOW (ref 98–111)
Chloride: 93 mmol/L — ABNORMAL LOW (ref 98–111)
Creatinine, Ser: 5.45 mg/dL — ABNORMAL HIGH (ref 0.61–1.24)
Creatinine, Ser: 5.83 mg/dL — ABNORMAL HIGH (ref 0.61–1.24)
GFR calc Af Amer: 10 mL/min — ABNORMAL LOW (ref >60)
GFR calc Af Amer: 9 mL/min — ABNORMAL LOW (ref >60)
GFR calc non Af Amer: 9 mL/min — ABNORMAL LOW (ref >60)
GFR calc non Af Amer: 8 mL/min — ABNORMAL LOW (ref >60)
Glucose, Bld: 101 mg/dL — ABNORMAL HIGH (ref 70–99)
Glucose, Bld: 141 mg/dL — ABNORMAL HIGH (ref 70–99)
Phosphorus: 7.9 mg/dL — ABNORMAL HIGH (ref 2.5–4.6)
Phosphorus: 8.5 mg/dL — ABNORMAL HIGH (ref 2.5–4.6)
Potassium: 5.4 mmol/L — ABNORMAL HIGH (ref 3.5–5.1)
Potassium: 5.4 mmol/L — ABNORMAL HIGH (ref 3.5–5.1)
Sodium: 132 mmol/L — ABNORMAL LOW (ref 135–145)
Sodium: 132 mmol/L — ABNORMAL LOW (ref 135–145)

## 2019-04-30 LAB — GLUCOSE, CAPILLARY
Glucose-Capillary: 117 mg/dL — ABNORMAL HIGH (ref 70–99)
Glucose-Capillary: 131 mg/dL — ABNORMAL HIGH (ref 70–99)
Glucose-Capillary: 148 mg/dL — ABNORMAL HIGH (ref 70–99)
Glucose-Capillary: 75 mg/dL (ref 70–99)
Glucose-Capillary: 81 mg/dL (ref 70–99)
Glucose-Capillary: 98 mg/dL (ref 70–99)
Glucose-Capillary: 99 mg/dL (ref 70–99)

## 2019-04-30 LAB — CBC
HCT: 28.4 % — ABNORMAL LOW (ref 39.0–52.0)
HCT: 26.7 % — ABNORMAL LOW (ref 39.0–52.0)
Hemoglobin: 8.9 g/dL — ABNORMAL LOW (ref 13.0–17.0)
Hemoglobin: 8.5 g/dL — ABNORMAL LOW (ref 13.0–17.0)
MCH: 29.6 pg (ref 26.0–34.0)
MCH: 29.6 pg (ref 26.0–34.0)
MCHC: 31.3 g/dL (ref 30.0–36.0)
MCHC: 31.8 g/dL (ref 30.0–36.0)
MCV: 94.4 fL (ref 80.0–100.0)
MCV: 93 fL (ref 80.0–100.0)
Platelets: 181 10*3/uL (ref 150–400)
Platelets: 203 10*3/uL (ref 150–400)
RBC: 3.01 MIL/uL — ABNORMAL LOW (ref 4.22–5.81)
RBC: 2.87 MIL/uL — ABNORMAL LOW (ref 4.22–5.81)
RDW: 16.2 % — ABNORMAL HIGH (ref 11.5–15.5)
RDW: 16.3 % — ABNORMAL HIGH (ref 11.5–15.5)
WBC: 13.5 10*3/uL — ABNORMAL HIGH (ref 4.0–10.5)
WBC: 17.6 10*3/uL — ABNORMAL HIGH (ref 4.0–10.5)
nRBC: 0 % (ref 0.0–0.2)
nRBC: 0 % (ref 0.0–0.2)

## 2019-04-30 LAB — MAGNESIUM: Magnesium: 2.2 mg/dL (ref 1.7–2.4)

## 2019-04-30 SURGERY — CYSTOSCOPY, WITH BLADDER FULGURATION
Anesthesia: General

## 2019-04-30 MED ORDER — LIDOCAINE 2% (20 MG/ML) 5 ML SYRINGE
INTRAMUSCULAR | Status: AC
  Filled 2019-04-30: qty 5

## 2019-04-30 MED ORDER — ALBUMIN HUMAN 25 % IV SOLN
INTRAVENOUS | Status: AC
  Administered 2019-04-30: 25 g
  Filled 2019-04-30: qty 200

## 2019-04-30 MED ORDER — ONDANSETRON HCL 4 MG/2ML IJ SOLN
INTRAMUSCULAR | Status: DC | PRN
  Administered 2019-04-30: 4 mg via INTRAVENOUS

## 2019-04-30 MED ORDER — FENTANYL CITRATE (PF) 100 MCG/2ML IJ SOLN: 25.0000 ug | INTRAMUSCULAR | Status: DC | PRN

## 2019-04-30 MED ORDER — SODIUM CHLORIDE 0.9 % IR SOLN
Status: DC | PRN
  Administered 2019-04-30: 9000 mL

## 2019-04-30 MED ORDER — PHENYLEPHRINE 40 MCG/ML (10ML) SYRINGE FOR IV PUSH (FOR BLOOD PRESSURE SUPPORT)
PREFILLED_SYRINGE | INTRAVENOUS | Status: AC
  Filled 2019-04-30: qty 10

## 2019-04-30 MED ORDER — CEFAZOLIN SODIUM-DEXTROSE 2-4 GM/100ML-% IV SOLN
2.0000 g | Freq: Once | INTRAVENOUS | Status: AC
  Administered 2019-04-30: 2 g via INTRAVENOUS

## 2019-04-30 MED ORDER — SODIUM CHLORIDE 0.9 % IV SOLN: INTRAVENOUS | Status: DC

## 2019-04-30 MED ORDER — CEFAZOLIN SODIUM-DEXTROSE 2-4 GM/100ML-% IV SOLN
INTRAVENOUS | Status: AC
  Filled 2019-04-30: qty 100

## 2019-04-30 MED ORDER — ONDANSETRON HCL 4 MG/2ML IJ SOLN: 4.0000 mg | Freq: Once | INTRAMUSCULAR | Status: DC | PRN

## 2019-04-30 MED ORDER — LIDOCAINE 2% (20 MG/ML) 5 ML SYRINGE
INTRAMUSCULAR | Status: DC | PRN
  Administered 2019-04-30: 80 mg via INTRAVENOUS

## 2019-04-30 MED ORDER — FENTANYL CITRATE (PF) 250 MCG/5ML IJ SOLN
INTRAMUSCULAR | Status: AC
  Filled 2019-04-30: qty 5

## 2019-04-30 MED ORDER — PHENYLEPHRINE 40 MCG/ML (10ML) SYRINGE FOR IV PUSH (FOR BLOOD PRESSURE SUPPORT)
PREFILLED_SYRINGE | INTRAVENOUS | Status: DC | PRN
  Administered 2019-04-30 (×2): 80 ug via INTRAVENOUS

## 2019-04-30 MED ORDER — FENTANYL CITRATE (PF) 250 MCG/5ML IJ SOLN
INTRAMUSCULAR | Status: DC | PRN
  Administered 2019-04-30: 50 ug via INTRAVENOUS
  Administered 2019-04-30: 25 ug via INTRAVENOUS

## 2019-04-30 MED ORDER — SODIUM ZIRCONIUM CYCLOSILICATE 10 G PO PACK
10.0000 g | PACK | Freq: Once | ORAL | Status: AC
  Administered 2019-04-30: 10 g via ORAL
  Filled 2019-04-30: qty 1

## 2019-04-30 MED ORDER — PROPOFOL 10 MG/ML IV BOLUS
INTRAVENOUS | Status: DC | PRN
  Administered 2019-04-30: 100 mg via INTRAVENOUS

## 2019-04-30 MED ORDER — HEPARIN SODIUM (PORCINE) 1000 UNIT/ML IJ SOLN
INTRAMUSCULAR | Status: AC
  Filled 2019-04-30: qty 4

## 2019-04-30 MED ORDER — SODIUM CHLORIDE 0.9 % IV SOLN: INTRAVENOUS | Status: DC | PRN

## 2019-04-30 MED ORDER — ONDANSETRON HCL 4 MG/2ML IJ SOLN
INTRAMUSCULAR | Status: AC
  Filled 2019-04-30: qty 2

## 2019-04-30 SURGICAL SUPPLY — 27 items
BAG URINE DRAIN 2000ML AR STRL (UROLOGICAL SUPPLIES) ×2 IMPLANT
BAG URINE LEG 500ML (DRAIN) ×2 IMPLANT
BAG URO CATCHER STRL LF (MISCELLANEOUS) ×2 IMPLANT
CATH FOLEY 2WAY SLVR  5CC 16FR (CATHETERS)
CATH FOLEY 2WAY SLVR 5CC 16FR (CATHETERS) IMPLANT
CATH HEMA 3WAY 30CC 22FR COUDE (CATHETERS) ×2 IMPLANT
CATH INTERMIT  6FR 70CM (CATHETERS) ×2 IMPLANT
GLOVE BIOGEL M 8.0 STRL (GLOVE) ×2 IMPLANT
GOWN STRL REUS W/ TWL LRG LVL3 (GOWN DISPOSABLE) ×1 IMPLANT
GOWN STRL REUS W/ TWL XL LVL3 (GOWN DISPOSABLE) ×1 IMPLANT
GOWN STRL REUS W/TWL LRG LVL3 (GOWN DISPOSABLE) ×1
GOWN STRL REUS W/TWL XL LVL3 (GOWN DISPOSABLE) ×1
GUIDEWIRE ANG ZIPWIRE 038X150 (WIRE) IMPLANT
GUIDEWIRE STR DUAL SENSOR (WIRE) ×2 IMPLANT
KIT TURNOVER KIT B (KITS) ×2 IMPLANT
LOOP CUT BIPOLAR 24F LRG (ELECTROSURGICAL) ×2 IMPLANT
MANIFOLD NEPTUNE II (INSTRUMENTS) IMPLANT
NS IRRIG 1000ML POUR BTL (IV SOLUTION) IMPLANT
PACK CYSTO (CUSTOM PROCEDURE TRAY) ×2 IMPLANT
STENT URET 6FRX24 CONTOUR (STENTS) IMPLANT
STENT URET 6FRX26 CONTOUR (STENTS) IMPLANT
SYPHON OMNI JUG (MISCELLANEOUS) ×2 IMPLANT
SYR TOOMEY IRRIG 70ML (MISCELLANEOUS) ×2
SYRINGE TOOMEY IRRIG 70ML (MISCELLANEOUS) ×1 IMPLANT
TOWEL GREEN STERILE FF (TOWEL DISPOSABLE) ×2 IMPLANT
TUBE CONNECTING 12X1/4 (SUCTIONS) IMPLANT
WATER STERILE IRR 3000ML UROMA (IV SOLUTION) ×2 IMPLANT

## 2019-04-30 NOTE — Progress Notes (Signed)
[address]       [place] [address]             [phone]      7 Days Post-Op Procedure(s) (LRB): INSERTION PLEURAL DRAINAGE CATHETER TO DRAIN LEFT PLEURAL EFFUSION (Left) Subjective: For cystoscopy today  Tolerating decannulation of trach  Objective: Vital signs in last 24 hours: Temp:  [97.5 F (36.4 C)-98 F (36.7 C)] 98 F (36.7 C) (03/15 0400) Pulse Rate:  [64-81] 81 (03/15 0600) Cardiac Rhythm: Normal sinus rhythm (03/15 0400) Resp:  [13-29] 20 (03/15 0600) BP: (99-130)/(37-61) 113/39 (03/15 0500) SpO2:  [84 %-100 %] 92 % (03/15 0600) Weight:  [86.7 kg] 86.7 kg (03/15 0500)  Hemodynamic parameters for last 24 hours: CVP:  [3 mmHg-5 mmHg] 5 mmHg  Intake/Output from previous day: 03/14 0701 - 03/15 0700 In: 91478 [I.V.:250; Blood:315; NG/GT:1080] Out: 29562 [Urine:24825] Intake/Output this shift: No intake/output data recorded.  General appearance: alert, cooperative and no distress Heart: regular rate and rhythm Lungs: dim right c/w left Abdomen: soft, non-tender Extremities: no significant edema Wound: incis healing well  Lab Results: Recent Labs    04/29/19 0451 04/30/19 0337  WBC 11.2* 13.5*  HGB 7.5* 8.9*  HCT 24.2* 28.4*  PLT 161 181   BMET:  Recent Labs    04/29/19 0449 04/30/19 0337  NA 135 132*  K 5.0 5.4*  CL 96* 91*  CO2 28 27  GLUCOSE 108* 101*  BUN 76* 94*  CREATININE 4.58* 5.45*  CALCIUM 8.0* 7.9*    PT/INR: No results for input(s): LABPROT, INR in the last 72 hours. ABG    Component Value Date/Time   PHART 7.488 (H) 04/07/2019 0405   HCO3 25.9 04/07/2019 0405   TCO2 36 (H) 04/23/2019 0449   ACIDBASEDEF 1.0 04/05/2019 0540   O2SAT 67.8 04/14/2019 1253   CBG (last 3)  Recent Labs    04/30/19 0020 04/30/19 0339 04/30/19 0705  GLUCAP 148* 99 81    Meds Scheduled Meds: . amiodarone  200 mg Oral Daily  . aspirin  81 mg Oral Daily  . atorvastatin  80 mg Oral q1800  . calcium acetate  667  mg Oral TID WC  . chlorhexidine  15 mL Mouth Rinse BID  . Chlorhexidine Gluconate Cloth  6 each Topical Q0600  . darbepoetin (ARANESP) injection - NON-DIALYSIS  100 mcg Subcutaneous Q Tue-1800  . feeding supplement (NEPRO CARB STEADY)  237 mL Oral BID BM  . feeding supplement (PRO-STAT SUGAR FREE 64)  30 mL Per Tube BID  . Gerhardt's butt cream   Topical BID  . hydrocortisone   Rectal BID  . insulin aspart  0-24 Units Subcutaneous Q4H  . insulin glargine  36 Units Subcutaneous Daily  . mouth rinse  15 mL Mouth Rinse q12n4p  . midodrine  15 mg Oral TID WC  . multivitamin  1 tablet Oral QHS  . pantoprazole (PROTONIX) IV  40 mg Intravenous Q24H  . simethicone  80 mg Oral BID PC  . sodium chloride flush  10-40 mL Intracatheter Q12H   Continuous Infusions: . sodium chloride    . sodium chloride    . feeding supplement (JEVITY 1.5 CAL/FIBER) 1,000 mL (04/29/19 1533)  . magnesium sulfate bolus IVPB    . norepinephrine (LEVOPHED) Adult infusion Stopped (04/24/19 1800)   PRN Meds:.sodium chloride, sodium chloride, oxyCODONE **AND** acetaminophen, alteplase, dextrose, heparin, heparin, hyoscyamine, levalbuterol, lidocaine (PF), lidocaine-prilocaine, loperamide HCl, Melatonin, ondansetron (ZOFRAN) IV, pentafluoroprop-tetrafluoroeth, promethazine,  Resource ThickenUp Clear, sodium chloride flush, sodium chloride flush  Xrays No results found.  Assessment/Plan: S/P Procedure(s) (LRB): INSERTION PLEURAL DRAINAGE CATHETER TO DRAIN LEFT PLEURAL EFFUSION (Left)  1 stable 2 for cystoscopy 3 cont AHF management 4 cont nephrology management    LOS: 47 days    John Giovanni PA-C 04/30/2019 [phone]

## 2019-04-30 NOTE — Anesthesia Procedure Notes (Addendum)
Procedure Name: LMA Insertion Date/Time: 04/30/2019 10:47 AM Performed by: Amadeo Garnet, CRNA Pre-anesthesia Checklist: Patient identified, Emergency Drugs available, Suction available and Patient being monitored Patient Re-evaluated:Patient Re-evaluated prior to induction Oxygen Delivery Method: Circle system utilized Preoxygenation: Pre-oxygenation with 100% oxygen Induction Type: IV induction Ventilation: Mask ventilation without difficulty LMA: LMA inserted LMA Size: 5.0 Placement Confirmation: positive ETCO2 and breath sounds checked- equal and bilateral Tube secured with: Tape Dental Injury: Teeth and Oropharynx as per pre-operative assessment

## 2019-04-30 NOTE — Progress Notes (Signed)
PT Cancellation Note  Patient Details Name: JERL MUNYAN MRN: [id number] DOB: 1928/11/05   Cancelled Treatment:    Reason Eval/Treat Not Completed: Patient at procedure or test/unavailable Pt off floor at cystoscopy. Will follow.   Marguarite Arbour A Kye Silverstein 04/30/2019, 9:39 AM  Marisa Severin, PT, DPT Acute Rehabilitation Services Pager [phone] Office [phone]

## 2019-04-30 NOTE — Op Note (Signed)
Procedure: Cystoscopy with evacuation of clot and fulguration of bladder neck bleeders.  Preop diagnosis: Persistent gross hematuria.  Postop diagnosis: Same with radiation cystitis.  Surgeon: Dr. Irine Seal.  Anesthesia: General.  Specimen: None.  Drain: 59 [place] three-way Foley catheter.  EBL:  200 mL of clot.  Complications: None.  Indications: The patient is an 86 year old male with a history of prostate cancer treated with radiation therapy was admitted for cardiac issues and has AKI.  He has had persistent intermittent gross hematuria for the last 4 weeks but had responded to intermittent irrigation and was in poor medical condition.  He has continued to have a decline in his hemoglobin intermittently with the persistent bleeding and improvement in his medical condition it was felt that office cystoscopy with clot evacuation and fulguration were indicated.  Procedure: He was given preoperative antibiotics.  He was taken the operating room where a general anesthetic was induced.  He was placed in lithotomy position and fitted with PAS hose.  His perineum and genitalia were prepped with Betadine solution he was draped in usual sterile fashion.  Cystoscopy was performed using a 23 [place] scope and 30 degree lens.  Examination revealed a normal urethra.  The external sphincter was intact.  The prostatic urethra had a somewhat necrotic appearance with a shaggy exudate from the Vero proximally possibly from his prior radiation and extensive irrigations with prolonged catheter drainage.  Initially the bladder was not readily visible because of the amount of retained clot.  I was able to evacuate the bladder free of clot with a [place]y syringe.  There was approximately 200 mL of old clot in his bladder.  Inspection of the bladder after irrigation revealed significant neovascularity related to his prior prostate radiation therapy at the bladder neck.  Ureteral orifices were uninvolved.  The body  of the bladder had some catheter irritation but no other significant mucosal lesions, tumors or stones were noted.  The cystoscope was replaced with the 26 French continuous-flow resectoscope sheath which was fitted with a Beatrix Fetters handle, a bipolar loop and a 30 degree lens with saline used as the irrigant.  The area of neovascularity was generously fulgurated from the bladder neck down to the trigone with an area of approximately 2 x 4 cm treated.  Some additional neovascularity on the left bladder neck was also fulgurated.  At this point there was no further bleeding even with the bladder decompressed so the resectoscope was removed and a 22 [place] three-way coud Foley catheter was placed.  The balloon was filled with 30 mL of sterile water and the catheter was placed to straight drainage and continuous irrigation with saline.  He was taken down from lithotomy position, his anesthetic was reversed and he was moved recovery in stable condition.  There were no complications.

## 2019-04-30 NOTE — Transfer of Care (Signed)
Immediate Anesthesia Transfer of Care Note  Patient: Jesse Murphy  Procedure(s) Performed: [place] (N/A )  Patient Location: PACU  Anesthesia Type:General  Level of Consciousness: awake, alert  and oriented  Airway & Oxygen Therapy: Patient Spontanous Breathing and Patient connected to nasal cannula oxygen  Post-op Assessment: Report given to RN, Post -op Vital signs reviewed and stable and Patient moving all extremities X 4  Post vital signs: Reviewed and stable  Last Vitals:  Vitals Value Taken Time  BP 122/73 04/30/19 1142  Temp    Pulse 88 04/30/19 1146  Resp 25 04/30/19 1146  SpO2 91 % 04/30/19 1146  Vitals shown include unvalidated device data.  Last Pain:  Vitals:   04/30/19 0700  TempSrc: Oral  PainSc:       Patients Stated Pain Goal: 3 (16/01/09 3235)  Complications: No apparent anesthesia complications

## 2019-04-30 NOTE — Progress Notes (Signed)
Patient ID: Jesse Murphy, male   DOB: May 10, 1928, 86 y.o.   MRN: [id number]  7 Days Post-Op  Subjective: Abijah continues to have hematuria and will be taken to the OR today for cystoscopy.  He voice no new complaints.   ROS:  Review of Systems  Unable to perform ROS: Medical condition    Anti-infectives: Anti-infectives (From admission, onward)   Start     Dose/Rate Route Frequency Ordered Stop   04/26/19 1526  ciprofloxacin (CIPRO) IVPB 400 mg  Status:  Discontinued     400 mg 200 mL/hr over 60 Minutes Intravenous 60 min pre-op 04/26/19 1526 04/26/19 1531   04/25/19 0800  ciprofloxacin (CIPRO) tablet 500 mg     500 mg Oral Every 24 hours 04/24/19 1120 04/27/19 0959   04/22/19 1345  ceFAZolin (ANCEF) IVPB 2g/100 mL premix     2 g 200 mL/hr over 30 Minutes Intravenous To Radiology 04/22/19 1341 04/23/19 0959   04/22/19 1130  ciprofloxacin (CIPRO) tablet 500 mg  Status:  Discontinued     500 mg Oral 2 times daily 04/22/19 1124 04/24/19 1120   04/20/19 1600  cefTRIAXone (ROCEPHIN) 1 g in sodium chloride 0.9 % 100 mL IVPB  Status:  Discontinued     1 g 200 mL/hr over 30 Minutes Intravenous Every 24 hours 04/20/19 1456 04/22/19 1124   04/19/19 1200  ceFAZolin (ANCEF) IVPB 1 g/50 mL premix     1 g 100 mL/hr over 30 Minutes Intravenous  Once 04/18/19 1204 04/19/19 1311   04/13/19 2000  meropenem (MERREM) 500 mg in sodium chloride 0.9 % 100 mL IVPB     500 mg 200 mL/hr over 30 Minutes Intravenous Every 24 hours 04/12/19 1242 04/13/19 2134   04/12/19 1800  meropenem (MERREM) 1 g in sodium chloride 0.9 % 100 mL IVPB  Status:  Discontinued     1 g 200 mL/hr over 30 Minutes Intravenous Every 12 hours 04/12/19 0826 04/12/19 1236   04/10/19 1800  meropenem (MERREM) 2 g in sodium chloride 0.9 % 100 mL IVPB  Status:  Discontinued     2 g 200 mL/hr over 30 Minutes Intravenous Every 12 hours 04/10/19 1400 04/12/19 0826   04/10/19 1637  vancomycin (VANCOCIN) powder  Status:  Discontinued     As needed 04/10/19 1638 04/10/19 1836   04/07/19 1100  meropenem (MERREM) 1 g in sodium chloride 0.9 % 100 mL IVPB  Status:  Discontinued     1 g 200 mL/hr over 30 Minutes Intravenous 2 times daily 04/07/19 1011 04/07/19 1012   04/07/19 1100  meropenem (MERREM) 1 g in sodium chloride 0.9 % 100 mL IVPB  Status:  Discontinued     1 g 200 mL/hr over 30 Minutes Intravenous Every 8 hours 04/07/19 1012 04/10/19 1400   04/04/19 1700  vancomycin (VANCOCIN) IVPB 1000 mg/200 mL premix  Status:  Discontinued     1,000 mg 200 mL/hr over 60 Minutes Intravenous Every 24 hours 04/04/19 1612 04/06/19 1536   04/04/19 1000  vancomycin (VANCOREADY) IVPB 1250 mg/250 mL  Status:  Discontinued     1,250 mg 166.7 mL/hr over 90 Minutes Intravenous Every 24 hours 04/03/19 0925 04/04/19 1009   04/03/19 0930  vancomycin (VANCOREADY) IVPB 2000 mg/400 mL     2,000 mg 200 mL/hr over 120 Minutes Intravenous  Once 04/03/19 0925 04/03/19 1528   03/29/19 2200  cefTAZidime (FORTAZ) 2 g in sodium chloride 0.9 % 100 mL IVPB     2 g  200 mL/hr over 30 Minutes Intravenous Every 12 hours 03/29/19 1256 04/03/19 2244   03/28/19 1100  cefTAZidime (FORTAZ) 1 g in sodium chloride 0.9 % 100 mL IVPB  Status:  Discontinued     1 g 200 mL/hr over 30 Minutes Intravenous Every 24 hours 03/28/19 0950 03/28/19 0950   03/28/19 1100  cefTAZidime (FORTAZ) 2 g in sodium chloride 0.9 % 100 mL IVPB  Status:  Discontinued     2 g 200 mL/hr over 30 Minutes Intravenous Every 24 hours 03/28/19 0950 03/28/19 0951   03/28/19 1100  cefTAZidime (FORTAZ) 2 g in sodium chloride 0.9 % 100 mL IVPB  Status:  Discontinued     2 g 200 mL/hr over 30 Minutes Intravenous Every 24 hours 03/28/19 0952 03/29/19 1256   03/26/19 1200  ceFEPIme (MAXIPIME) 2 g in sodium chloride 0.9 % 100 mL IVPB  Status:  Discontinued     2 g 200 mL/hr over 30 Minutes Intravenous Daily 03/26/19 1104 03/28/19 0950   03/19/19 1000  cefTRIAXone (ROCEPHIN) 1 g in sodium chloride 0.9 % 100  mL IVPB     1 g 200 mL/hr over 30 Minutes Intravenous Daily 03/19/19 0810 03/25/19 1117   03/16/19 2230  vancomycin (VANCOCIN) IVPB 1000 mg/200 mL premix     1,000 mg 200 mL/hr over 60 Minutes Intravenous  Once 03/16/19 1721 03/17/19 0024   03/16/19 1830  cefUROXime (ZINACEF) 1.5 g in sodium chloride 0.9 % 100 mL IVPB     1.5 g 200 mL/hr over 30 Minutes Intravenous Every 12 hours 03/16/19 1721 03/18/19 0557   03/16/19 0400  vancomycin (VANCOREADY) IVPB 1500 mg/300 mL  Status:  Discontinued     1,500 mg 150 mL/hr over 120 Minutes Intravenous To Surgery 03/15/19 1425 03/16/19 1715   03/16/19 0400  cefUROXime (ZINACEF) 1.5 g in sodium chloride 0.9 % 100 mL IVPB  Status:  Discontinued     1.5 g 200 mL/hr over 30 Minutes Intravenous To Surgery 03/15/19 1425 03/16/19 1715   03/16/19 0400  cefUROXime (ZINACEF) 750 mg in sodium chloride 0.9 % 100 mL IVPB     750 mg 200 mL/hr over 30 Minutes Intravenous To Surgery 03/15/19 1425 03/16/19 1540      Current Facility-Administered Medications  Medication Dose Route Frequency Provider Last Rate Last Admin  . 0.9 %  sodium chloride infusion  100 mL Intravenous PRN Corliss Parish, MD      . 0.9 %  sodium chloride infusion  100 mL Intravenous PRN Corliss Parish, MD      . oxyCODONE (ROXICODONE) 5 MG/5ML solution 5 mg  5 mg Oral Q4H PRN Prescott Gum, Collier Salina, MD   5 mg at 04/29/19 2100   And  . acetaminophen (TYLENOL) 160 MG/5ML solution 325 mg  325 mg Oral Q4H PRN Ivin Poot, MD   325 mg at 04/23/19 0726  . alteplase (CATHFLO ACTIVASE) injection 2 mg  2 mg Intracatheter Once PRN Corliss Parish, MD      . amiodarone (PACERONE) tablet 200 mg  200 mg Oral Daily Bensimhon, Shaune Pascal, MD   200 mg at 04/29/19 1030  . aspirin chewable tablet 81 mg  81 mg Oral Daily Prescott Gum, Collier Salina, MD   81 mg at 04/29/19 1028  . atorvastatin (LIPITOR) tablet 80 mg  80 mg Oral q1800 Prescott Gum, Collier Salina, MD   80 mg at 04/29/19 1702  . calcium acetate (PHOSLO)  capsule 667 mg  667 mg Oral TID WC Corliss Parish, MD  667 mg at 04/29/19 1702  . chlorhexidine (PERIDEX) 0.12 % solution 15 mL  15 mL Mouth Rinse BID Ivin Poot, MD   15 mL at 04/29/19 2319  . Chlorhexidine Gluconate Cloth 2 % PADS 6 each  6 each Topical Q0600 Corliss Parish, MD   6 each at 04/30/19 [phone]  . Darbepoetin Alfa (ARANESP) injection 100 mcg  100 mcg Subcutaneous Q Tue-1800 Corliss Parish, MD   100 mcg at 04/24/19 2101  . dextrose 50 % solution 0-50 mL  0-50 mL Intravenous PRN Prescott Gum, Collier Salina, MD      . feeding supplement (JEVITY 1.5 CAL/FIBER) liquid 1,000 mL  1,000 mL Oral Continuous Prescott Gum, Collier Salina, MD 40 mL/hr at 04/29/19 1533 1,000 mL at 04/29/19 1533  . feeding supplement (NEPRO CARB STEADY) liquid 237 mL  237 mL Oral BID BM Prescott Gum, Collier Salina, MD   237 mL at 04/27/19 1400  . feeding supplement (PRO-STAT SUGAR FREE 64) liquid 30 mL  30 mL Per Tube BID Prescott Gum, Collier Salina, MD   30 mL at 04/29/19 2319  . Gerhardt's butt cream   Topical BID Ivin Poot, MD   Given at 04/29/19 2320  . heparin injection 1,000 Units  1,000 Units Dialysis PRN Corliss Parish, MD   3,800 Units at 04/27/19 1454  . heparin injection 1,700 Units  20 Units/kg Dialysis PRN Corliss Parish, MD   1,700 Units at 04/27/19 1202  . hydrocortisone (ANUSOL-HC) 2.5 % rectal cream   Rectal BID Ivin Poot, MD   Given at 04/29/19 2319  . hyoscyamine (LEVSIN SL) SL tablet 0.125 mg  0.125 mg Sublingual Q6H PRN Prescott Gum, Collier Salina, MD      . insulin aspart (novoLOG) injection 0-24 Units  0-24 Units Subcutaneous Q4H Ivin Poot, MD   2 Units at 04/30/19 0045  . insulin glargine (LANTUS) injection 36 Units  36 Units Subcutaneous Daily Prescott Gum, Collier Salina, MD   36 Units at 04/29/19 1037  . levalbuterol (XOPENEX) nebulizer solution 0.63 mg  0.63 mg Nebulization Q6H PRN Ivin Poot, MD   0.63 mg at 04/26/19 1353  . lidocaine (PF) (XYLOCAINE) 1 % injection 5 mL  5 mL Intradermal PRN  Corliss Parish, MD      . lidocaine-prilocaine (EMLA) cream 1 application  1 application Topical PRN Prescott Gum, Collier Salina, MD      . loperamide HCl (IMODIUM) 1 MG/7.5ML suspension 4 mg  4 mg Oral PRN Ivin Poot, MD   4 mg at 04/26/19 2044  . magnesium sulfate IVPB 2 g 50 mL  2 g Intravenous Once Ivin Poot, MD      . MEDLINE mouth rinse  15 mL Mouth Rinse q12n4p Prescott Gum, Collier Salina, MD   15 mL at 04/29/19 1600  . Melatonin TABS 3 mg  3 mg Oral QHS PRN Nila Nephew, MD   3 mg at 04/29/19 0041  . midodrine (PROAMATINE) tablet 15 mg  15 mg Oral TID WC Prescott Gum, Collier Salina, MD   15 mg at 04/29/19 1703  . multivitamin (RENA-VIT) tablet 1 tablet  1 tablet Oral QHS Ivin Poot, MD   1 tablet at 04/29/19 2319  . norepinephrine (LEVOPHED) 16 mg in 265mL premix infusion  2-10 mcg/min Intravenous Titrated Ivin Poot, MD   Stopped at 04/24/19 1800  . ondansetron (ZOFRAN) injection 4 mg  4 mg Intravenous Q6H PRN Ivin Poot, MD   4 mg at 04/28/19 1759  . pantoprazole (PROTONIX) injection  40 mg  40 mg Intravenous Q24H Ivin Poot, MD   40 mg at 04/29/19 1702  . pentafluoroprop-tetrafluoroeth (GEBAUERS) aerosol 1 application  1 application Topical PRN Corliss Parish, MD      . promethazine (PHENERGAN) injection 6.25 mg  6.25 mg Intravenous Q6H PRN Prescott Gum, Collier Salina, MD   6.25 mg at 04/24/19 0841  . Resource ThickenUp Clear   Oral PRN Ivin Poot, MD   Given at 04/28/19 [phone]  . simethicone (MYLICON) chewable tablet 80 mg  80 mg Oral BID PC Prescott Gum, Collier Salina, MD   80 mg at 04/29/19 1702  . sodium chloride flush (NS) 0.9 % injection 10-40 mL  10-40 mL Intracatheter Q12H Ivin Poot, MD   10 mL at 04/29/19 2320  . sodium chloride flush (NS) 0.9 % injection 10-40 mL  10-40 mL Intracatheter PRN Prescott Gum, Collier Salina, MD      . sodium chloride flush (NS) 0.9 % injection 3 mL  3 mL Intravenous PRN Prescott Gum, Collier Salina, MD   3 mL at 04/16/19 0954     Objective: Vital signs in last  24 hours: Temp:  [97.5 F (36.4 C)-98 F (36.7 C)] 98 F (36.7 C) (03/15 0400) Pulse Rate:  [64-81] 81 (03/15 0600) Resp:  [13-29] 20 (03/15 0600) BP: (99-130)/(37-61) 113/39 (03/15 0500) SpO2:  [84 %-100 %] 92 % (03/15 0600) Weight:  [86.7 kg] 86.7 kg (03/15 0500)  Intake/Output from previous day: 03/14 0701 - 03/15 0700 In: 48185 [I.V.:250; Blood:315; NG/GT:1080] Out: 90931 [Urine:24825] Intake/Output this shift: No intake/output data recorded.   Physical Exam Vitals reviewed.  Constitutional:      Appearance: Normal appearance.  Genitourinary:    Comments: Bloody urine in foley tubing.  Neurological:     Mental Status: He is alert.     Lab Results:  Recent Labs    04/29/19 0451 04/30/19 0337  WBC 11.2* 13.5*  HGB 7.5* 8.9*  HCT 24.2* 28.4*  PLT 161 181   BMET Recent Labs    04/29/19 0449 04/30/19 0337  NA 135 132*  K 5.0 5.4*  CL 96* 91*  CO2 28 27  GLUCOSE 108* 101*  BUN 76* 94*  CREATININE 4.58* 5.45*  CALCIUM 8.0* 7.9*   PT/INR No results for input(s): LABPROT, INR in the last 72 hours. ABG No results for input(s): PHART, HCO3 in the last 72 hours.  Invalid input(s): PCO2, PO2  Studies/Results: No results found.   Assessment and Plan: Persistent gross hematuria with a history of prostate cancer treated with radiation therapy.   I will proceed with cystoscopy, clot evacuation and fulguration with possible biopsy.  I spoke to the son and he is agreeable to the plan.       LOS: 47 days    Irine Seal 04/30/2019 [phone]

## 2019-04-30 NOTE — Progress Notes (Signed)
SLP Cancellation Note  Patient Details Name: Jesse Murphy MRN: [id number] DOB: 1928/12/23   Cancelled treatment:       Reason Eval/Treat Not Completed: Patient at procedure or test/unavailable(Pt is currently NPO for cystoscopy which has not been completed as yet. Case was discussed with Herbie Baltimore, RN in short stay and it was agreed that the MBS should be deferred today considering the potential impact of anesthesia on swallowing.)  [name] I. [name], [place], [place] Office number [phone] Pager [place] 04/30/2019, 10:29 AM

## 2019-04-30 NOTE — Progress Notes (Addendum)
Dentures picked up by [facility] or [facility]

## 2019-04-30 NOTE — Anesthesia Preprocedure Evaluation (Addendum)
Anesthesia Evaluation  Patient identified by MRN, date of birth, ID band Patient awake    Reviewed: Allergy & Precautions, NPO status , Patient's Chart, lab work & pertinent test results  Airway Mallampati: II  TM Distance: >3 FB Neck ROM: Full    Dental  (+) Dental Advisory Given, Edentulous Upper, Edentulous Lower   Pulmonary shortness of breath, former smoker,  Trach decannulated 3/13  Left Pleural Effusion s/p PleurX on 3/8   Pulmonary exam normal breath sounds clear to auscultation       Cardiovascular hypertension, + CAD, + Past MI, + CABG and +CHF  Normal cardiovascular exam Rhythm:Regular Rate:Normal  Required Impella support post CABG. Impella removed 2/23. 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  Reproductive/Obstetrics                           Anesthesia Physical Anesthesia Plan  ASA: IV  Anesthesia Plan: General   Post-op Pain Management:    Induction: Intravenous  PONV Risk Score and Plan: 3 and Ondansetron and Dexamethasone  Airway Management Planned: LMA  Additional Equipment:   Intra-op Plan:   Post-operative Plan: Extubation in OR  Informed Consent: I have reviewed the patients History and Physical, chart, labs and discussed the procedure including the risks, benefits and alternatives for the proposed anesthesia with the patient or authorized representative who has indicated his/her understanding and acceptance.   Patient has DNR.  Discussed DNR with patient and Continue DNR.   Dental advisory given  Plan Discussed with:  CRNA  Anesthesia Plan Comments:       Anesthesia Quick Evaluation

## 2019-04-30 NOTE — Progress Notes (Signed)
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 He is scheduled to have a swallow study delayed until tomorrow Maintaining sinus rhythm and stable blood pressure Pleurx catheter on left drained 900 cc of serosanguineous fluid Tracheostomy site is covered. Objective: Vital signs in last 24 hours: Temp:  [97 F (36.1 C)-98 F (36.7 C)] 97.7 F (36.5 C) (03/15 2000) Pulse Rate:  [69-89] 73 (03/15 2130) Cardiac Rhythm: Normal sinus rhythm (03/15 1600) Resp:  [12-28] 13 (03/15 2130) BP: (59-146)/(35-88) 104/43 (03/15 2130) SpO2:  [84 %-100 %] 97 % (03/15 2130) Weight:  [86.7 kg-87.4 kg] 87.4 kg (03/15 1900)  Hemodynamic parameters for last 24 hours: CVP:  [4 mmHg-5 mmHg] 5 mmHg  Intake/Output from previous day: 03/14 0701 - 03/15 0700 In: 63016 [I.V.:250; Blood:315; NG/GT:1080] Out: 01093 [Urine:24825] Intake/Output this shift: Total I/O In: 75 [Other:20; NG/GT:40] Out: -        Exam    General- alert and comfortable    Neck- no JVD, no cervical adenopathy palpable, no carotid bruit   Lungs- clear without rales, wheezes   Cor- regular rate and rhythm, no murmur , gallop   Abdomen- soft, non-tender   Extremities - warm, non-tender, minimal edema   Neuro- oriented, appropriate, no focal weakness   Lab Results: Recent Labs    04/30/19 0337 04/30/19 1441  WBC 13.5* 17.6*  HGB 8.9* 8.5*  HCT 28.4* 26.7*  PLT 181 203   BMET:  Recent Labs    04/30/19 0337 04/30/19 1441  NA 132* 132*  K 5.4* 5.4*  CL 91* 93*  CO2 27 24  GLUCOSE 101* 141*  BUN 94* 98*  CREATININE 5.45* 5.83*  CALCIUM 7.9* 7.8*    PT/INR: No results for input(s): LABPROT, INR in the last 72 hours. ABG    Component Value Date/Time   PHART 7.488 (H) 04/07/2019  0405   HCO3 25.9 04/07/2019 0405   TCO2 36 (H) 04/23/2019 0449   ACIDBASEDEF 1.0 04/05/2019 0540   O2SAT 67.8 04/14/2019 1253   CBG (last 3)  Recent Labs    04/30/19 1438 04/30/19 1648 04/30/19 2038  GLUCAP 131* 98 117*    Assessment/Plan: S/P Procedure(s) (LRB): CYSTOSCOPY WITH FULGERATION (N/A) Mobilize Physical therapy  Swallow evaluation   LOS: 47 days    Tharon Aquas Trigt III 04/30/2019

## 2019-04-30 NOTE — Progress Notes (Signed)
[facility] Progress Note    Assessment/ Plan:   1. AKI(Cr was 0.7 prior to surgery)- due to postoperative CHF/cardiogenic - started on CRRT  03/29/19 - 2/24. Attempted IHD on 2/26 with minimal UF --> increased pressors and rapid afib.  Then CRRT on 3/1-- 3/8. Had been ontoleratingnet UF with  CRRT.   BUN and crt rising off CRRT which indicates he will remain HD dep. Tolerated IHD on 3/10 and on 3/12.  Next HD today  Monday 3/15 2. Acute systolic CHF/cardiogenic shock- EF 25-30%; s/p Impella- now out. optimize volume as above. On midodrine  15 mg TID.  off pressors- still overloaded 3. CAD s/p [id number] 2/67/12 complicated by cardiogenic shock and ARF.Postop echo EF 25-30% 4. Acute hypoxic respiratory failure with pseudomonas pneumonia- s/p trach on 03/27/19.s/pabx per PCCM-> completed abx  5. Hematuria- bladder irrigationper urology recommendations-  for OR cysto today 6. ABLA- Hx Melena. Transfuse as needed. Have given RRBC's on 2/22 and 2/23. And 2/25. On aranesp 100 weekly- hgb down again  7. Protein malnutrition- moderate to severe- alb 2.0 8. Secondary hyperparathyroidism/bone mineral -  phos rising,s/p phoslo -.  pth 116 9. Disposition-  trach is out--> this was a huge step re: outpatient HD candidacy.  Will see how he does throughout week and if continues to improve will consider perm access--> only has L IJ TDC for now  Subjective:    For cysto in the OR today.  Reports having to urinate--> bladder spasms?   Objective:   BP (!) 113/39   Pulse 81   Temp 97.7 F (36.5 C) (Oral)   Resp 20   Ht 5\' 8"  (1.727 m)   Wt 86.7 kg   SpO2 92%   BMI 29.06 kg/m   Intake/Output Summary (Last 24 hours) at 04/30/2019 0853 Last data filed at 04/30/2019 0600 Gross per 24 hour  Intake 31525 ml  Output 23975 ml  Net 7550 ml   Weight change: -0.8 kg  Physical Exam: Gen: older gentleman, NAD NECK: trach site dressed CVS: tachycardic Resp: clear  anteriorly, bases diminished Abd: soft Ext: 1+ dependent and flank edema [facility]  Imaging: DG Chest Port 1 View  Result Date: 04/30/2019 CLINICAL DATA:  Tachycardia.  History of CABG. EXAM: PORTABLE CHEST 1 VIEW COMPARISON:  04/27/2019 FINDINGS: Left jugular dialysis catheter is stable with the tip in the SVC. Right arm PICC line terminates in the SVC. Stable position of the left chest tube. Markedly increased densities in the left hemithorax particularly in the left upper lung and left apical region. However, there is some lucency at the left lung apex and difficult to exclude pleural air in this area. Slightly increased hazy densities in the right lower lung. Cardiac silhouette appears to be upper limits of normal. Persistent densities at the left lung base. Feeding tube extends into the abdomen. IMPRESSION: 1. Markedly increased densities in the left hemithorax. Despite having a left chest tube, these densities could represent increased pleural fluid. Findings may also be related to significant volume loss and possible airspace disease. Cannot exclude a small amount of left pleural air at the apex. Left chest findings could be better evaluated with a chest CT. 2. Persistent densities at both lung bases could represent a combination atelectasis and pleural fluid. 3. Central venous catheters are adequately positioned. Electronically Signed   By: Markus Daft M.D.   On: 04/30/2019 08:06    Labs: BMET Recent Labs  Lab 04/26/19 1732 04/27/19 0423 04/27/19  1641 04/28/19 0516 04/28/19 1720 04/29/19 0449 04/30/19 0337  NA 136 137 139 136 134* 135 132*  K 4.9 5.4* 3.7 4.6 5.2* 5.0 5.4*  CL 97* 96* 100 97* 96* 96* 91*  CO2 27 26 29 28 28 28 27   GLUCOSE 107* 89 78 129* 120* 108* 101*  BUN 69* 82* 31* 50* 64* 76* 94*  CREATININE 3.99* 4.53* 2.48* 3.46* 4.04* 4.58* 5.45*  CALCIUM 8.1* 8.0* 8.1* 7.8* 7.9* 8.0* 7.9*  PHOS 5.7* 6.2* 3.6 5.5* 6.5* 7.4* 7.9*   CBC Recent Labs  Lab  04/25/19 0454 04/27/19 0423 04/29/19 0451 04/30/19 0337  WBC 9.9 12.5* 11.2* 13.5*  HGB 7.4* 8.1* 7.5* 8.9*  HCT 23.6* 25.5* 24.2* 28.4*  MCV 93.7 93.4 94.5 94.4  PLT 145* 160 161 181    Medications:    . amiodarone  200 mg Oral Daily  . aspirin  81 mg Oral Daily  . atorvastatin  80 mg Oral q1800  . calcium acetate  667 mg Oral TID WC  . chlorhexidine  15 mL Mouth Rinse BID  . Chlorhexidine Gluconate Cloth  6 each Topical Q0600  . darbepoetin (ARANESP) injection - NON-DIALYSIS  100 mcg Subcutaneous Q Tue-1800  . feeding supplement (NEPRO CARB STEADY)  237 mL Oral BID BM  . feeding supplement (PRO-STAT SUGAR FREE 64)  30 mL Per Tube BID  . Gerhardt's butt cream   Topical BID  . hydrocortisone   Rectal BID  . insulin aspart  0-24 Units Subcutaneous Q4H  . insulin glargine  36 Units Subcutaneous Daily  . mouth rinse  15 mL Mouth Rinse q12n4p  . midodrine  15 mg Oral TID WC  . multivitamin  1 tablet Oral QHS  . pantoprazole (PROTONIX) IV  40 mg Intravenous Q24H  . simethicone  80 mg Oral BID PC  . sodium chloride flush  10-40 mL Intracatheter Q12H  . sodium zirconium cyclosilicate  10 g Oral Once      Madelon Lips, MD 04/30/2019, 8:53 AM

## 2019-04-30 NOTE — Progress Notes (Signed)
Patient ID: Jesse Murphy, male   DOB: 1928-07-09, 86 y.o.   MRN: [id number]    Advanced Heart Failure Rounding Note  PCP-Cardiologist: Kate Sable, MD   Subjective:    Events - Impella removed 2/23. Intraoperative TEE showed EF ~40% - Had tunneled cath placed 3/9 and left PleurX tube.  - Tolerated iHD on 3/10 and 3/12 - Trach decannulated 3/13  Tolerating trach decannulation. Respiratory status ok. Denies orthopnea or PND. Remaining in NSR.  For cystoscopy this am. CVP 5. Weight stable. Repeat iHD later today   Objective:   Weight Range: 86.7 kg Body mass index is 29.06 kg/m.   Vital Signs:   Temp:  [97.5 F (36.4 C)-98 F (36.7 C)] 98 F (36.7 C) (03/15 0400) Pulse Rate:  [64-81] 81 (03/15 0600) Resp:  [13-28] 20 (03/15 0600) BP: (99-130)/(37-61) 113/39 (03/15 0500) SpO2:  [84 %-100 %] 92 % (03/15 0600) Weight:  [86.7 kg] 86.7 kg (03/15 0500) Last BM Date: 04/28/19  Weight change: Filed Weights   04/27/19 1125 04/29/19 0500 04/30/19 0500  Weight: 87.1 kg 87.5 kg 86.7 kg    Intake/Output:   Intake/Output Summary (Last 24 hours) at 04/30/2019 0823 Last data filed at 04/30/2019 0600 Gross per 24 hour  Intake 31525 ml  Output 23975 ml  Net 7550 ml      Physical Exam   General:  Lying in bed weak No resp difficulty HEENT: normal Neck: supple. JVP 5-6. Trach site bandaged  Carotids 2+ bilat; no bruits. No lymphadenopathy or thryomegaly appreciated. Cor: PMI nondisplaced. Regular rate & rhythm. No rubs, gallops or murmurs. Lungs: clear with low effort Abdomen: soft, nontender, nondistended. No hepatosplenomegaly. No bruits or masses. Good bowel sounds. Extremities: no cyanosis, clubbing, rash, 1+ edema Neuro: alert & orientedx3, cranial nerves grossly intact. moves all 4 extremities w/o difficulty. Affect pleasant   Telemetry   NSR 70-80sPersonally reviewed   Labs    CBC Recent Labs    04/29/19 0451 04/30/19 0337  WBC 11.2* 13.5*  HGB 7.5*  8.9*  HCT 24.2* 28.4*  MCV 94.5 94.4  PLT 161 384   Basic Metabolic Panel Recent Labs    04/28/19 0516 04/29/19 0449 04/29/19 0451 04/30/19 0337  NA  --  135  --  132*  K  --  5.0  --  5.4*  CL  --  96*  --  91*  CO2  --  28  --  27  GLUCOSE  --  108*  --  101*  BUN  --  76*  --  94*  CREATININE  --  4.58*  --  5.45*  CALCIUM  --  8.0*  --  7.9*  MG   < >  --  2.1 2.2  PHOS  --  7.4*  --  7.9*   < > = values in this interval not displayed.   Liver Function Tests Recent Labs    04/29/19 0449 04/30/19 0337  ALBUMIN 2.0* 2.1*   No results for input(s): LIPASE, AMYLASE in the last 72 hours. Cardiac Enzymes No results for input(s): CKTOTAL, CKMB, CKMBINDEX, TROPONINI in the last 72 hours.  BNP: BNP (last 3 results) Recent Labs    03/22/19 0401 03/26/19 0626 03/27/19 1255  BNP 340.4* 687.5* 800.4*    ProBNP (last 3 results) No results for input(s): PROBNP in the last 8760 hours.   D-Dimer No results for input(s): DDIMER in the last 72 hours. Hemoglobin A1C No results for input(s): HGBA1C in the  last 72 hours. Fasting Lipid Panel No results for input(s): CHOL, HDL, LDLCALC, TRIG, CHOLHDL, LDLDIRECT in the last 72 hours. Thyroid Function Tests No results for input(s): TSH, T4TOTAL, T3FREE, THYROIDAB in the last 72 hours.  Invalid input(s): FREET3  Other results:   Imaging    DG Chest Port 1 View  Result Date: 04/30/2019 CLINICAL DATA:  Tachycardia.  History of CABG. EXAM: PORTABLE CHEST 1 VIEW COMPARISON:  04/27/2019 FINDINGS: Left jugular dialysis catheter is stable with the tip in the SVC. Right arm PICC line terminates in the SVC. Stable position of the left chest tube. Markedly increased densities in the left hemithorax particularly in the left upper lung and left apical region. However, there is some lucency at the left lung apex and difficult to exclude pleural air in this area. Slightly increased hazy densities in the right lower lung. Cardiac  silhouette appears to be upper limits of normal. Persistent densities at the left lung base. Feeding tube extends into the abdomen. IMPRESSION: 1. Markedly increased densities in the left hemithorax. Despite having a left chest tube, these densities could represent increased pleural fluid. Findings may also be related to significant volume loss and possible airspace disease. Cannot exclude a small amount of left pleural air at the apex. Left chest findings could be better evaluated with a chest CT. 2. Persistent densities at both lung bases could represent a combination atelectasis and pleural fluid. 3. Central venous catheters are adequately positioned. Electronically Signed   By: Markus Daft M.D.   On: 04/30/2019 08:06     Medications:     Scheduled Medications: . amiodarone  200 mg Oral Daily  . aspirin  81 mg Oral Daily  . atorvastatin  80 mg Oral q1800  . calcium acetate  667 mg Oral TID WC  . chlorhexidine  15 mL Mouth Rinse BID  . Chlorhexidine Gluconate Cloth  6 each Topical Q0600  . darbepoetin (ARANESP) injection - NON-DIALYSIS  100 mcg Subcutaneous Q Tue-1800  . feeding supplement (NEPRO CARB STEADY)  237 mL Oral BID BM  . feeding supplement (PRO-STAT SUGAR FREE 64)  30 mL Per Tube BID  . Gerhardt's butt cream   Topical BID  . hydrocortisone   Rectal BID  . insulin aspart  0-24 Units Subcutaneous Q4H  . insulin glargine  36 Units Subcutaneous Daily  . mouth rinse  15 mL Mouth Rinse q12n4p  . midodrine  15 mg Oral TID WC  . multivitamin  1 tablet Oral QHS  . pantoprazole (PROTONIX) IV  40 mg Intravenous Q24H  . simethicone  80 mg Oral BID PC  . sodium chloride flush  10-40 mL Intracatheter Q12H  . sodium zirconium cyclosilicate  10 g Oral Once    Infusions: . sodium chloride    . sodium chloride    . feeding supplement (JEVITY 1.5 CAL/FIBER) 1,000 mL (04/29/19 1533)  . magnesium sulfate bolus IVPB    . norepinephrine (LEVOPHED) Adult infusion Stopped (04/24/19 1800)     PRN Medications: sodium chloride, sodium chloride, oxyCODONE **AND** acetaminophen, alteplase, dextrose, heparin, heparin, hyoscyamine, levalbuterol, lidocaine (PF), lidocaine-prilocaine, loperamide HCl, Melatonin, ondansetron (ZOFRAN) IV, pentafluoroprop-tetrafluoroeth, promethazine, Resource ThickenUp Clear, sodium chloride flush, sodium chloride flush    Assessment/Plan   1. Acute systolic HF -> Cardiogenic shock - post-op echo on 03/20/19 EF 25-30% (pre-op 30-35%. In 12/2017 EF normal) - Required Impella support post CABG. Impella removed 2/23. Post-op TEE EF 40% - volume status mildly elevated but weight stable - tolerated iHD  on 3/10 & 3/12 off pressors - For intermittent hemodialysis again today after cystoscopy   2. CAD s/p CABG this admit (LIMA->LAD, SVG->OM, SVG->RCA on 03/15/19) - No s/s ischemia - Continue ASA +statin  3. Acute hypoxic respiratory failure with Pseudomonas PNA - s/p trach on 03/27/19 - completed meropenem for pseudomonas PNA - trach aspirate now growing Pseudomonas again.   - now on cipro. We had planned to treat for 14 days (after talking with ID pharmD) but was stopped on 3/12 after 6 days as it was felt to be colonization and not active infection.  Will d/w Dr. Prescott Gum today -Trach decannulated 3/13.  Tolerating well. Continue to follow   4. PAF - s/p MAZE and LAA occlusion - Has been in/out AF. - Now maintaining NSR on po amio. We wil continue   5. AKI  - due to shock/ATN - On CVVHD. Making minimal urine - Tunneled cath placed 3/9  - He tolerated intermittent hemodialysis on 3/10 and 3/12 - Plan for iHD again this morning  -If tolerates need to consider possible permanent access. Will d/w Nephrology regarding timing    6. Hematuria -Ongoing.  Remains on CBI. -Urology planning cysto today  7. Melena - Resolved  8. Anemia - No evidence of active bleeding hemoglobin 7.5 this morning.  Transfuse to keep hgb >= 7.5    9. Debility,  severe - continue PT/OT  10. F/E/N - Tube feeds per Cor-trak.  Tolerating well  12. Left Pleural Effusion  - moderate to large. - s/p PleurX on 3/8.  -Remains small on chest x-ray  Overall remains tenuous but improving.  He seems to be tolerating intermittent HD.  We will try again today  If tolerates be worth considering permanent access especially now that trach is out and it should be easier to arrange for outpatient hemodialysis. Will discuss timing with Nephrology.  Remains in normal sinus rhythm on amiodarone.  He is on for cystoscopy today due to ongoing hematuria.  Continue aggressive rehab efforts.  Glori Bickers, MD  8:23 AM

## 2019-05-01 ENCOUNTER — Inpatient Hospital Stay (HOSPITAL_COMMUNITY): Payer: Medicare Other

## 2019-05-01 LAB — GLUCOSE, CAPILLARY
Glucose-Capillary: 112 mg/dL — ABNORMAL HIGH (ref 70–99)
Glucose-Capillary: 117 mg/dL — ABNORMAL HIGH (ref 70–99)
Glucose-Capillary: 121 mg/dL — ABNORMAL HIGH (ref 70–99)
Glucose-Capillary: 124 mg/dL — ABNORMAL HIGH (ref 70–99)
Glucose-Capillary: 143 mg/dL — ABNORMAL HIGH (ref 70–99)
Glucose-Capillary: 98 mg/dL (ref 70–99)

## 2019-05-01 LAB — RENAL FUNCTION PANEL
Albumin: 2.6 g/dL — ABNORMAL LOW (ref 3.5–5.0)
Anion gap: 11 (ref 5–15)
BUN: 52 mg/dL — ABNORMAL HIGH (ref 8–23)
CO2: 28 mmol/L (ref 22–32)
Calcium: 7.8 mg/dL — ABNORMAL LOW (ref 8.9–10.3)
Chloride: 98 mmol/L (ref 98–111)
Creatinine, Ser: 3.73 mg/dL — ABNORMAL HIGH (ref 0.61–1.24)
GFR calc Af Amer: 16 mL/min — ABNORMAL LOW (ref >60)
GFR calc non Af Amer: 14 mL/min — ABNORMAL LOW (ref >60)
Glucose, Bld: 138 mg/dL — ABNORMAL HIGH (ref 70–99)
Phosphorus: 4.8 mg/dL — ABNORMAL HIGH (ref 2.5–4.6)
Potassium: 4.7 mmol/L (ref 3.5–5.1)
Sodium: 137 mmol/L (ref 135–145)

## 2019-05-01 LAB — MAGNESIUM: Magnesium: 2 mg/dL (ref 1.7–2.4)

## 2019-05-01 MED ORDER — SODIUM CHLORIDE 0.9 % IV SOLN
0.3000 ug/kg | Freq: Once | INTRAVENOUS | Status: AC
  Administered 2019-05-01: 26 ug via INTRAVENOUS
  Filled 2019-05-01: qty 6.5

## 2019-05-01 MED ORDER — JUVEN PO PACK
1.0000 | PACK | Freq: Two times a day (BID) | ORAL | Status: DC
  Administered 2019-05-01 – 2019-05-17 (×23): 1
  Filled 2019-05-01 (×24): qty 1

## 2019-05-01 MED ORDER — SODIUM BICARBONATE 650 MG PO TABS
650.0000 mg | ORAL_TABLET | Freq: Once | ORAL | Status: DC
  Filled 2019-05-01: qty 1

## 2019-05-01 MED ORDER — RESOURCE THICKENUP CLEAR PO POWD: ORAL | Status: DC | PRN

## 2019-05-01 MED ORDER — DARBEPOETIN ALFA 100 MCG/0.5ML IJ SOSY: 100.0000 ug | PREFILLED_SYRINGE | INTRAMUSCULAR | Status: DC

## 2019-05-01 MED ORDER — PANCRELIPASE (LIP-PROT-AMYL) 10440-39150 UNITS PO TABS
20880.0000 [IU] | ORAL_TABLET | Freq: Once | ORAL | Status: DC
  Filled 2019-05-01: qty 2

## 2019-05-01 NOTE — Anesthesia Postprocedure Evaluation (Signed)
Anesthesia Post Note  Patient: Jesse Murphy  Procedure(s) Performed: CYSTOSCOPY WITH FULGERATION (N/A )     Patient location during evaluation: PACU Anesthesia Type: General Level of consciousness: awake and alert Pain management: pain level controlled Vital Signs Assessment: post-procedure vital signs reviewed and stable Respiratory status: spontaneous breathing, nonlabored ventilation, respiratory function stable and patient connected to nasal cannula oxygen Cardiovascular status: blood pressure returned to baseline and stable Postop Assessment: no apparent nausea or vomiting Anesthetic complications: no    Last Vitals:  Vitals:   05/01/19 0600 05/01/19 0700  BP: (!) 96/37 (!) 102/33  Pulse: 74 74  Resp: 16 16  Temp:    SpO2: 92% 93%    Last Pain:  Vitals:   05/01/19 0700  TempSrc:   PainSc: 0-No pain   Pain Goal: Patients Stated Pain Goal: 3 (04/23/19 0303)                 Jariana Shumard

## 2019-05-01 NOTE — Progress Notes (Addendum)
Physical Therapy Treatment Patient Details Name: Jesse Murphy MRN: [id number] DOB: 07/04/1928 Today's Date: 05/01/2019    History of Present Illness 86 y.o. male with PMH: h/o CAD s/p CABG and clipping of left atrial appendage, who presented with SOB on 2/1. He became hypoxic on 2/2 with pulmonary edema and needed to be intubated on 2/2 and extubated 2/5. Pt underwent impella placement and tracheostomy on 2/9. Pt started on CRRT 2/11. Impella removal and tracheostomy 2/23. CRRT restarted 3/1 as pt could not tolerate HD. Pt on continuous bladder irrigation.s/p cystoscopy with evacuation of clot and fulgoration of bladder neck bleeders 3/15.    PT Comments    Patient progressing well towards PT goals. Continues to require assist of 2 for bed mobility and standing. Noted to have marked weakness in BLEs. Tolerated gait training today with Min A and use of EVA walker. VSS during mobility needing 6L/min 02 [place] to maintain 02 sats in 90s. Sp02 dropped to 84% on RA. Goals and POC updated. Pt needs max coaxing for gait training. Reports worsened dizziness with mobility. RN present to assist with lines. Pt with decreased activity tolerance/endurance. Will follow.   Follow Up Recommendations  SNF;Supervision/Assistance - 24 hour     Equipment Recommendations  Other (comment)(defer)    Recommendations for Other Services       Precautions / Restrictions Precautions Precautions: Fall;Sternal Precaution Booklet Issued: No Precaution Comments: NG tube, CRRT, continuous bladder irrigation Restrictions Weight Bearing Restrictions: Yes Other Position/Activity Restrictions: sternal precautions    Mobility  Bed Mobility Overal bed mobility: Needs Assistance Bed Mobility: Supine to Sit     Supine to sit: Mod assist;+2 for physical assistance;HOB elevated     General bed mobility comments: Able to bring LEs to EOB, assist with trunk and scooting bottom. + dizziness.  Transfers Overall transfer  level: Needs assistance Equipment used: 2 person hand held assist Transfers: Sit to/from Stand Sit to Stand: Max assist;Mod assist;+2 physical assistance         General transfer comment: Assist to power to standing with use of momentum and cues for technique, little initiation at first. Bil knee instability. Stood from [employer]. Transferred to chair post ambulation.  Ambulation/Gait Ambulation/Gait assistance: Min assist;+2 safety/equipment Gait Distance (Feet): 28 Feet Assistive device: (Eva walker) Gait Pattern/deviations: Step-to pattern;Decreased step length - right;Decreased step length - left;Narrow base of support;Step-through pattern Gait velocity: decreased   General Gait Details: Slow, unsteady gait with narrow BoS. Cues to widen BoS, heavy lean on EVA. 1 standing rest break. VSS. SP02 dropped to 84% on RA once back in room, needed 6L for ambulation.   Stairs             Wheelchair Mobility    Modified Rankin (Stroke Patients Only)       Balance Overall balance assessment: Needs assistance Sitting-balance support: Feet supported;Bilateral upper extremity supported Sitting balance-Leahy Scale: Fair Sitting balance - Comments: Min guard-Min A for EOB balance.   Standing balance support: During functional activity Standing balance-Leahy Scale: Poor Standing balance comment: reliant on external support                            Cognition Arousal/Alertness: Awake/alert Behavior During Therapy: Flat affect Overall Cognitive Status: No family/caregiver present to determine baseline cognitive functioning  General Comments: Appears WFl, needs to be pushed for mobility. Follows commands.      Exercises General Exercises - Lower Extremity Ankle Circles/Pumps: AROM;Both;10 reps Long Arc Quad: AROM;Both;10 reps;Seated    General Comments General comments (skin integrity, edema, etc.): RN present and  assisting with lines. Pre activity BP 104/39, sitting BP post activity 112/44. Sp02 dropped to 84% on RA, stayed >90% on 6L with activity.      Pertinent Vitals/Pain Pain Assessment: No/denies pain    Home Living                      Prior Function            PT Goals (current goals can now be found in the care plan section) Acute Rehab PT Goals Patient Stated Goal: get better PT Goal Formulation: With patient Time For Goal Achievement: 05/15/19 Potential to Achieve Goals: Fair Progress towards PT goals: Progressing toward goals;Goals met and updated - see care plan    Frequency    Min 2X/week      PT Plan Current plan remains appropriate    Co-evaluation              AM-PAC PT "6 Clicks" Mobility   Outcome Measure  Help needed turning from your back to your side while in a flat bed without using bedrails?: A Lot Help needed moving from lying on your back to sitting on the side of a flat bed without using bedrails?: A Lot Help needed moving to and from a bed to a chair (including a wheelchair)?: A Lot Help needed standing up from a chair using your arms (e.g., wheelchair or bedside chair)?: A Lot Help needed to walk in hospital room?: A Little Help needed climbing 3-5 steps with a railing? : Total 6 Click Score: 12    End of Session Equipment Utilized During Treatment: Gait belt Activity Tolerance: Patient tolerated treatment well Patient left: with call bell/phone within reach;in chair;with nursing/sitter in room Nurse Communication: Mobility status PT Visit Diagnosis: Other abnormalities of gait and mobility (R26.89);Muscle weakness (generalized) (M62.81)     Time: 1025-8527 PT Time Calculation (min) (ACUTE ONLY): 25 min  Charges:  $Gait Training: 8-22 mins $Therapeutic Activity: 8-22 mins                     Marisa Severin, PT, DPT Acute Rehabilitation Services Pager [phone] [place] 05/01/2019,  1:19 PM

## 2019-05-01 NOTE — Progress Notes (Signed)
      [address]       [place] [address]             [phone]      Stable day  No new issues  Revonda Standard. Roxan Hockey, MD [facility] [phone]

## 2019-05-01 NOTE — Progress Notes (Signed)
OT Cancellation Note  Patient Details Name: DELL BRINER MRN: [id number] DOB: [birth year]   Cancelled Treatment:    Reason Eval/Treat Not Completed: Patient at procedure or test/ unavailable(Pt at [facility] near time of OT intervention.)  OT to continue to follow for OT intervention.  Jefferey Pica, OTR/L Acute Rehabilitation Services Pager: [phone] Office: [phone]   Jefferey Pica 05/01/2019, 2:54 PM

## 2019-05-01 NOTE — Progress Notes (Signed)
Jesse Murphy Progress Note    Assessment/ Plan:   1. AKI(Cr was 0.7 prior to surgery)- due to postoperative CHF/cardiogenic - started on CRRT  03/29/19 - 2/24. Attempted IHD on 2/26 with minimal UF --> increased pressors and rapid afib.  Then CRRT on 3/1-- 3/8. Had been ontoleratingnet UF with  CRRT.   BUN and crt rising off CRRT which indicates he will remain HD dep. Tolerated IHD on 3/10 and on 3/12 and 3/15, no heparin.  Next HD planned 3/17.   2. Acute systolic CHF/cardiogenic shock- EF 25-30%; s/p Impella- now out. optimize volume as above. On midodrine  15 mg TID.  off pressors- still overloaded, trying to chip away at volume  3. CAD s/p [id number] 1/44/31 complicated by cardiogenic shock and ARF.Postop echo EF 25-30% 4. Acute hypoxic respiratory failure with pseudomonas pneumonia- s/p trach on 03/27/19.s/pabx per PCCM-> completed abx  5. Hematuria- bladder irrigationper urology recommendations-  radiation cystitis--> can try a hemostatic dose of DDAVP today to see if that improves things--> have ordered.   6. ABLA- Hx Melen and hematuria. Transfuse as needed.On aranesp 100 weekly- hgb down again  7. Protein malnutrition- moderate to severe- alb 2.0 8. Secondary hyperparathyroidism/bone mineral -  phos rising,s/p phoslo -.  pth 116 9. Disposition-  trach is out--> this was a huge step re: outpatient HD candidacy.  Will see how he does throughout week and if continues to improve will consider perm access--> only has L IJ TDC for now  Subjective:    Cysto yesterday showing radiation cystitis, clots evacuated.  CBI continues.  4 hr HD without heparin completed yesterday.  Feels tired this AM.     Objective:   BP (!) 102/33   Pulse 74   Temp 97.8 F (36.6 C) (Oral)   Resp 16   Ht 5\' 8"  (1.727 m)   Wt 86 kg   SpO2 93%   BMI 28.83 kg/m   Intake/Output Summary (Last 24 hours) at 05/01/2019 5400 Last data filed at 05/01/2019 0730 Gross per 24 hour   Intake 37120 ml  Output 39552 ml  Net -2432 ml   Weight change: 0.7 kg  Physical Exam: Gen: older gentleman, NAD NECK: trach site dressed CVS: tachycardic Resp: clear anteriorly, bases diminished Abd: soft Ext: 1+ dependent and flank edema, improving [facility]  Imaging: DG Chest Port 1 View  Result Date: 04/30/2019 CLINICAL DATA:  Tachycardia.  History of CABG. EXAM: PORTABLE CHEST 1 VIEW COMPARISON:  04/27/2019 FINDINGS: Left jugular dialysis catheter is stable with the tip in the SVC. Right arm PICC line terminates in the SVC. Stable position of the left chest tube. Markedly increased densities in the left hemithorax particularly in the left upper lung and left apical region. However, there is some lucency at the left lung apex and difficult to exclude pleural air in this area. Slightly increased hazy densities in the right lower lung. Cardiac silhouette appears to be upper limits of normal. Persistent densities at the left lung base. Feeding tube extends into the abdomen. IMPRESSION: 1. Markedly increased densities in the left hemithorax. Despite having a left chest tube, these densities could represent increased pleural fluid. Findings may also be related to significant volume loss and possible airspace disease. Cannot exclude a small amount of left pleural air at the apex. Left chest findings could be better evaluated with a chest CT. 2. Persistent densities at both lung bases could represent a combination atelectasis and pleural fluid. 3. Central venous  catheters are adequately positioned. Electronically Signed   By: Markus Daft M.D.   On: 04/30/2019 08:06    Labs: BMET Recent Labs  Lab 04/27/19 1641 04/28/19 0516 04/28/19 1720 04/29/19 0449 04/30/19 0337 04/30/19 1441 05/01/19 0439  NA 139 136 134* 135 132* 132* 137  K 3.7 4.6 5.2* 5.0 5.4* 5.4* 4.7  CL 100 97* 96* 96* 91* 93* 98  CO2 29 28 28 28 27 24 28   GLUCOSE 78 129* 120* 108* 101* 141* 138*  BUN 31* 50* 64*  76* 94* 98* 52*  CREATININE 2.48* 3.46* 4.04* 4.58* 5.45* 5.83* 3.73*  CALCIUM 8.1* 7.8* 7.9* 8.0* 7.9* 7.8* 7.8*  PHOS 3.6 5.5* 6.5* 7.4* 7.9* 8.5* 4.8*   CBC Recent Labs  Lab 04/27/19 0423 04/29/19 0451 04/30/19 0337 04/30/19 1441  WBC 12.5* 11.2* 13.5* 17.6*  HGB 8.1* 7.5* 8.9* 8.5*  HCT 25.5* 24.2* 28.4* 26.7*  MCV 93.4 94.5 94.4 93.0  PLT 160 161 181 203    Medications:    . amiodarone  200 mg Oral Daily  . aspirin  81 mg Oral Daily  . atorvastatin  80 mg Oral q1800  . calcium acetate  667 mg Oral TID WC  . chlorhexidine  15 mL Mouth Rinse BID  . Chlorhexidine Gluconate Cloth  6 each Topical Q0600  . darbepoetin (ARANESP) injection - DIALYSIS  100 mcg Intravenous Q Tue-HD  . feeding supplement (NEPRO CARB STEADY)  237 mL Oral BID BM  . feeding supplement (PRO-STAT SUGAR FREE 64)  30 mL Per Tube BID  . Gerhardt's butt cream   Topical BID  . hydrocortisone   Rectal BID  . insulin aspart  0-24 Units Subcutaneous Q4H  . insulin glargine  36 Units Subcutaneous Daily  . mouth rinse  15 mL Mouth Rinse q12n4p  . midodrine  15 mg Oral TID WC  . multivitamin  1 tablet Oral QHS  . pantoprazole (PROTONIX) IV  40 mg Intravenous Q24H  . simethicone  80 mg Oral BID PC  . sodium chloride flush  10-40 mL Intracatheter Q12H      Madelon Lips, MD 05/01/2019, 8:33 AM

## 2019-05-01 NOTE — Progress Notes (Signed)
Patient ID: Jesse Murphy, male   DOB: 04-18-1928, 86 y.o.   MRN: [id number] f    Advanced Heart Failure Rounding Note  PCP-Cardiologist: Kate Sable, MD   Subjective:    Events - Impella removed 2/23. Intraoperative TEE showed EF ~40% - Had tunneled cath placed 3/9 and left PleurX tube.  - Tolerated iHD on 3/10 and 3/12 - Trach decannulated 3/13 - Cystoscopy 3/15 with radiation cystitis -> fulgeration  Cysto yesterday showing radiation cystitis, clots evacuated.  CBI continues.  4 hr HD yesterday without heparin completed yesterday. BP stable. Remains in NSR. For MBS today  Weight at baseline    Objective:   Weight Range: 86 kg Body mass index is 28.83 kg/m.   Vital Signs:   Temp:  [97 F (36.1 C)-97.9 F (36.6 C)] 97.8 F (36.6 C) (03/16 0821) Pulse Rate:  [71-89] 74 (03/16 0700) Resp:  [12-27] 16 (03/16 0700) BP: (59-146)/(33-73) 102/33 (03/16 0700) SpO2:  [90 %-100 %] 93 % (03/16 0700) Weight:  [86 kg-87.4 kg] 86 kg (03/16 0600) Last BM Date: 04/28/19  Weight change: Filed Weights   04/30/19 1900 04/30/19 2309 05/01/19 0600  Weight: 87.4 kg 86 kg 86 kg    Intake/Output:   Intake/Output Summary (Last 24 hours) at 05/01/2019 1026 Last data filed at 05/01/2019 0800 Gross per 24 hour  Intake 34120 ml  Output 40552 ml  Net -6432 ml      Physical Exam   General:  Lying in bed No resp difficulty HEENT: normal Neck: supple. no JVD. Trach site covered Carotids 2+ bilat; no bruits. No lymphadenopathy or thryomegaly appreciated. Cor: PMI nondisplaced. Regular rate & rhythm. No rubs, gallops or murmurs. Lungs: clear Abdomen: soft, nontender, nondistended. No hepatosplenomegaly. No bruits or masses. Good bowel sounds. Extremities: no cyanosis, clubbing, rash, tr  Edema  Neuro: alert & orientedx3, cranial nerves grossly intact. moves all 4 extremities w/o difficulty. Affect pleasant Foley in place with CBI    Telemetry   NSR 70-80s Personally  reviewed   Labs    CBC Recent Labs    04/30/19 0337 04/30/19 1441  WBC 13.5* 17.6*  HGB 8.9* 8.5*  HCT 28.4* 26.7*  MCV 94.4 93.0  PLT 181 409   Basic Metabolic Panel Recent Labs    04/30/19 0337 04/30/19 0337 04/30/19 1441 05/01/19 0439  NA 132*   < > 132* 137  K 5.4*   < > 5.4* 4.7  CL 91*   < > 93* 98  CO2 27   < > 24 28  GLUCOSE 101*   < > 141* 138*  BUN 94*   < > 98* 52*  CREATININE 5.45*   < > 5.83* 3.73*  CALCIUM 7.9*   < > 7.8* 7.8*  MG 2.2  --   --  2.0  PHOS 7.9*   < > 8.5* 4.8*   < > = values in this interval not displayed.   Liver Function Tests Recent Labs    04/30/19 1441 05/01/19 0439  ALBUMIN 2.0* 2.6*   No results for input(s): LIPASE, AMYLASE in the last 72 hours. Cardiac Enzymes No results for input(s): CKTOTAL, CKMB, CKMBINDEX, TROPONINI in the last 72 hours.  BNP: BNP (last 3 results) Recent Labs    03/22/19 0401 03/26/19 0626 03/27/19 1255  BNP 340.4* 687.5* 800.4*    ProBNP (last 3 results) No results for input(s): PROBNP in the last 8760 hours.   D-Dimer No results for input(s): DDIMER in the last 72 hours.  Hemoglobin A1C No results for input(s): HGBA1C in the last 72 hours. Fasting Lipid Panel No results for input(s): CHOL, HDL, LDLCALC, TRIG, CHOLHDL, LDLDIRECT in the last 72 hours. Thyroid Function Tests No results for input(s): TSH, T4TOTAL, T3FREE, THYROIDAB in the last 72 hours.  Invalid input(s): FREET3  Other results:   Imaging    DG Chest Port 1 View  Result Date: 05/01/2019 CLINICAL DATA:  Respiratory failure, post CABG EXAM: PORTABLE CHEST 1 VIEW COMPARISON:  04/30/2019 FINDINGS: Left dialysis catheter and feeding tube remain in place, unchanged. Postoperative changes. Right PICC line remains in place with the tip at the cavoatrial junction. Bilateral layering effusions and lower lobe opacities, similar to prior study. Improving aeration in the left upper lobe. No pneumothorax. IMPRESSION: Continued  layering effusions and bilateral lower lobe atelectasis or infiltrates. Improved aeration in the left upper lobe. No pneumothorax. Electronically Signed   By: Rolm Baptise M.D.   On: 05/01/2019 09:29     Medications:     Scheduled Medications: . amiodarone  200 mg Oral Daily  . aspirin  81 mg Oral Daily  . atorvastatin  80 mg Oral q1800  . calcium acetate  667 mg Oral TID WC  . chlorhexidine  15 mL Mouth Rinse BID  . Chlorhexidine Gluconate Cloth  6 each Topical Q0600  . [START ON 05/02/2019] darbepoetin (ARANESP) injection - DIALYSIS  100 mcg Intravenous Q Wed-HD  . feeding supplement (NEPRO CARB STEADY)  237 mL Oral BID BM  . feeding supplement (PRO-STAT SUGAR FREE 64)  30 mL Per Tube BID  . Gerhardt's butt cream   Topical BID  . hydrocortisone   Rectal BID  . insulin aspart  0-24 Units Subcutaneous Q4H  . insulin glargine  36 Units Subcutaneous Daily  . mouth rinse  15 mL Mouth Rinse q12n4p  . midodrine  15 mg Oral TID WC  . multivitamin  1 tablet Oral QHS  . pantoprazole (PROTONIX) IV  40 mg Intravenous Q24H  . simethicone  80 mg Oral BID PC  . sodium chloride flush  10-40 mL Intracatheter Q12H    Infusions: . sodium chloride    . sodium chloride    . sodium chloride    . desmopressin (DDAVP) IV    . feeding supplement (JEVITY 1.5 CAL/FIBER) 1,000 mL (04/29/19 1533)  . magnesium sulfate bolus IVPB    . norepinephrine (LEVOPHED) Adult infusion Stopped (04/24/19 1800)    PRN Medications: sodium chloride, sodium chloride, oxyCODONE **AND** acetaminophen, alteplase, dextrose, hyoscyamine, levalbuterol, lidocaine (PF), lidocaine-prilocaine, loperamide HCl, Melatonin, ondansetron (ZOFRAN) IV, pentafluoroprop-tetrafluoroeth, promethazine, Resource ThickenUp Clear, sodium chloride flush, sodium chloride flush    Assessment/Plan   1. Acute systolic HF -> Cardiogenic shock - post-op echo on 03/20/19 EF 25-30% (pre-op 30-35%. In 12/2017 EF normal) - Required Impella support post  CABG. Impella removed 2/23. Post-op TEE EF 40% - volume status much improved  - tolerated iHD on 3/10 & 3/12 & 3/14 off pressors - For intermittent hemodialysis again 3/16  2. CAD s/p CABG this admit (LIMA->LAD, SVG->OM, SVG->RCA on 03/15/19) - No s/s ischemia - Continue ASA +statin  3. Acute hypoxic respiratory failure with Pseudomonas PNA - s/p trach on 03/27/19 - completed meropenem for pseudomonas PNA - trach aspirate now growing Pseudomonas again.   - now on cipro. We had planned to treat for 14 days (after talking with ID pharmD) but was stopped on 3/12 after 6 days as it was felt to be colonization and not active infection.  -  Trach decannulated 3/13.  Tolerating well. Continue to follow   4. PAF - s/p MAZE and LAA occlusion - Has been in/out AF. - Now maintaining NSR on po amio. - We will continue. No change  5. AKI  - due to shock/ATN - On CVVHD. Making minimal urine - Tunneled cath placed 3/9  - He tolerated intermittent hemodialysis on 3/10, 3/12, 3/14 - Plan for iHD tomorrow. -If tolerates need to consider possible permanent access.  I will d/w Dr. Hollie Salk regarding timing    6. Hematuria -Ongoing.  Remains on CBI. -Cystoscopy 3/15 with fulgeration  7. Melena - Resolved  8. Anemia - No evidence of active bleeding hemoglobin 7.5 this morning.  Transfuse to keep hgb >= 7.5    9. Debility, severe - continue PT/OT  10. F/E/N - Tube feeds per Cor-trak.  Tolerating well  12. Left Pleural Effusion  - moderate to large. - s/p PleurX on 3/8.  - TCTS managing   Overall remains tenuous but improving.  He seems to be tolerating intermittent HD. If tolerates be worth considering permanent access especially now that trach is out and it should be easier to arrange for outpatient hemodialysis. Will discuss timing with Nephrology as above.  Remains in normal sinus rhythm on amiodarone.  Continue aggressive rehab efforts.  Glori Bickers, MD  10:26 AM

## 2019-05-01 NOTE — Progress Notes (Signed)
  Speech Language Pathology  Patient Details Name: Jesse Murphy MRN: [id number] DOB: 1928/02/17 Today's Date: 05/01/2019 Time:  -     MBS scheduled for today at 1330.                   [name] 05/01/2019, 9:00 AM  [name].[name] Risk analyst [phone] Office [phone]

## 2019-05-01 NOTE — Progress Notes (Addendum)
[address]       [place] [address]             [phone]      1 Day Post-Op Procedure(s) (LRB): CYSTOSCOPY WITH FULGERATION (N/A) Subjective: For MBS this afternoon Still with some hematuria on CBI- clear this am, s/p cystoscopy yesterday  Objective: Vital signs in last 24 hours: Temp:  [97 F (36.1 C)-97.9 F (36.6 C)] 97.8 F (36.6 C) (03/16 0821) Pulse Rate:  [71-89] 74 (03/16 0700) Cardiac Rhythm: Normal sinus rhythm (03/16 0400) Resp:  [12-27] 16 (03/16 0700) BP: (59-146)/(33-73) 102/33 (03/16 0700) SpO2:  [90 %-100 %] 93 % (03/16 0700) Weight:  [86 kg-87.4 kg] 86 kg (03/16 0600)  Hemodynamic parameters for last 24 hours: CVP:  [3 mmHg-6 mmHg] 6 mmHg  Intake/Output from previous day: 03/15 0701 - 03/16 0700 In: 41660 [P.O.:60; I.V.:100; NG/GT:680; IV Piggyback:100] Out: 63016 [WFUXN:23557; Blood:20; Chest Tube:1000] Intake/Output this shift: Total I/O In: 3000 [Other:3000] Out: 3200 [Urine:3200]  General appearance: alert, cooperative and no distress Heart: regular rate and rhythm Lungs: ckear anteriorly Abdomen: soft, nontender Extremities: min edema Wound: incis healing well  Lab Results: Recent Labs    04/30/19 0337 04/30/19 1441  WBC 13.5* 17.6*  HGB 8.9* 8.5*  HCT 28.4* 26.7*  PLT 181 203   BMET:  Recent Labs    04/30/19 1441 05/01/19 0439  NA 132* 137  K 5.4* 4.7  CL 93* 98  CO2 24 28  GLUCOSE 141* 138*  BUN 98* 52*  CREATININE 5.83* 3.73*  CALCIUM 7.8* 7.8*    PT/INR: No results for input(s): LABPROT, INR in the last 72 hours. ABG    Component Value Date/Time   PHART 7.488 (H) 04/07/2019 0405   HCO3 25.9 04/07/2019 0405   TCO2 36 (H) 04/23/2019 0449   ACIDBASEDEF 1.0 04/05/2019 0540   O2SAT 67.8 04/14/2019 1253   CBG (last 3)  Recent Labs    05/01/19 0447 05/01/19 0451 05/01/19 0819  GLUCAP 35* 121* 117*    Meds Scheduled Meds: . amiodarone  200 mg Oral Daily  . aspirin  81 mg Oral  Daily  . atorvastatin  80 mg Oral q1800  . calcium acetate  667 mg Oral TID WC  . chlorhexidine  15 mL Mouth Rinse BID  . Chlorhexidine Gluconate Cloth  6 each Topical Q0600  . darbepoetin (ARANESP) injection - DIALYSIS  100 mcg Intravenous Q Tue-HD  . feeding supplement (NEPRO CARB STEADY)  237 mL Oral BID BM  . feeding supplement (PRO-STAT SUGAR FREE 64)  30 mL Per Tube BID  . Gerhardt's butt cream   Topical BID  . hydrocortisone   Rectal BID  . insulin aspart  0-24 Units Subcutaneous Q4H  . insulin glargine  36 Units Subcutaneous Daily  . mouth rinse  15 mL Mouth Rinse q12n4p  . midodrine  15 mg Oral TID WC  . multivitamin  1 tablet Oral QHS  . pantoprazole (PROTONIX) IV  40 mg Intravenous Q24H  . simethicone  80 mg Oral BID PC  . sodium chloride flush  10-40 mL Intracatheter Q12H   Continuous Infusions: . sodium chloride    . sodium chloride    . sodium chloride    . desmopressin (DDAVP) IV    . feeding supplement (JEVITY 1.5 CAL/FIBER) 1,000 mL (04/29/19 1533)  . magnesium sulfate bolus IVPB    . norepinephrine (LEVOPHED) Adult infusion Stopped (04/24/19 1800)   PRN Meds:.sodium chloride,  sodium chloride, oxyCODONE **AND** acetaminophen, alteplase, dextrose, hyoscyamine, levalbuterol, lidocaine (PF), lidocaine-prilocaine, loperamide HCl, Melatonin, ondansetron (ZOFRAN) IV, pentafluoroprop-tetrafluoroeth, promethazine, Resource ThickenUp Clear, sodium chloride flush, sodium chloride flush  Xrays DG Chest Port 1 View  Result Date: 04/30/2019 CLINICAL DATA:  Tachycardia.  History of CABG. EXAM: PORTABLE CHEST 1 VIEW COMPARISON:  04/27/2019 FINDINGS: Left jugular dialysis catheter is stable with the tip in the SVC. Right arm PICC line terminates in the SVC. Stable position of the left chest tube. Markedly increased densities in the left hemithorax particularly in the left upper lung and left apical region. However, there is some lucency at the left lung apex and difficult to exclude  pleural air in this area. Slightly increased hazy densities in the right lower lung. Cardiac silhouette appears to be upper limits of normal. Persistent densities at the left lung base. Feeding tube extends into the abdomen. IMPRESSION: 1. Markedly increased densities in the left hemithorax. Despite having a left chest tube, these densities could represent increased pleural fluid. Findings may also be related to significant volume loss and possible airspace disease. Cannot exclude a small amount of left pleural air at the apex. Left chest findings could be better evaluated with a chest CT. 2. Persistent densities at both lung bases could represent a combination atelectasis and pleural fluid. 3. Central venous catheters are adequately positioned. Electronically Signed   By: Markus Daft M.D.   On: 04/30/2019 08:06    Assessment/Plan: S/P Procedure(s) (LRB): CYSTOSCOPY WITH FULGERATION (N/A)  1 conts to progress 2 MBS today 3 for DDAVP today for hematuria, s/p cystoscopy yesterday- radiation cystitis 4 no heparin on dialysis- HD planned for tomorrow 5 AHF team managing systolic failure issues 6 maintaining sinus rhythm 7 small pleural effus on CXR  LOS: 48 days    [facility] 05/01/2019 Pager [phone]  Patient notably stronger. Ambulated 28 feet with physical therapy Results of modified barium swallow study pending Chest x-ray today shows improved left lung aeration after Pleurx drainage yesterday Maintaining sinus rhythm with adequate blood pressure We will remove epicardial pacing wires Thursday, his next non-HD day. Follow rising white count patient examined and medical record reviewed,agree with above note. Tharon Aquas Trigt III 05/01/2019

## 2019-05-01 NOTE — Progress Notes (Addendum)
Nutrition Follow up   DOCUMENTATION CODES:   Not applicable  INTERVENTION:  Continue tube feeding:  -Jevity 1.5 @ 40 ml/hr via [name] -30 ml Prostat BID -Juven BID  Provides: 1640 kcals, 91 grams protein, 730 ml free water. Meets 75% of kcal needs and 67% protein needs. Rate decreased to promote appetite as pt complaining of fullness.   -Magic Cup TID -Nepro shake BID  NUTRITION DIAGNOSIS:   Increased nutrient needs related to post-op healing as evidenced by estimated needs.  Ongoing  GOAL:   Patient will meet greater than or equal to 90% of their needs   Addressed via tube feeding   MONITOR:   Weight trends, Diet advancement, Vent status, Skin, TF tolerance, Labs, I & O's  REASON FOR ASSESSMENT:   Consult Enteral/tube feeding initiation and management  ASSESSMENT:   Patient with PMH significant for CAD s/p PCI, prostate cancer, HTN, CHF, and DM. Presents this admission with chronic a.fib with RVR.   1/28- s/p L heart cath  1/30- s/p CABG x3, MAZE, extubated  2/2- re-intubated  2/5- extubated 2/8- re-intubated  2/10- s/p impella, trach, NGT placed-start trickle feeding 2/11- start CRRT 2/23- removal of impella, [name] dislodged  2/24- CRRT stop 3/1- failed iHD, back on CRRT 3/2- s/p [name], nocturnal feedings started 3/4- diet advanced DYS2 , nectar thick  3/8- s/p L PleurX catheter and L [facility] 3/13- s/p decannulation  3/15- s/p cystoscopy   Plan for MBS today. Recommend continuing TF until PO meets 75% consistently.   Per pt, appetite remains poor due to ongoing nausea. Has not received zofran today. Nepro at bedside with a couple sips taken. Pt does not like the flavor provided. RD encouraged intake to promote wound and post-op healing.  May need to consider increasing tube feeding to meet 100% of needs if intake does not progress after MBS (lowered in hopes to promote appetite).   Admission weight: 89.3 kg  Current weight: 86 kg (stable over the  last week)  Drips: NS @ 10 ml/hr  Medications: phoslo, aranesp, SS novolog, lantus, rena-vit Labs: CBG 35-143 corrected calcium 7.2 (L) Phosphorus 4.8 (wdl for HD)   Diet Order:   Diet Order    None      EDUCATION NEEDS:   Not appropriate for education at this time  Skin:  Skin Assessment: Skin Integrity Issues: Skin Integrity Issues:: Stage I, DTI, Stage II DTI: chest, R heel Stage I: vertebral column Stage II: buttocks Incisions: chest, leg  Last BM:  3/13  Height:   Ht Readings from Last 1 Encounters:  03/26/19 5\' 8"  (1.727 m)    Weight:   Wt Readings from Last 1 Encounters:  05/01/19 86 kg    Ideal Body Weight:  70 kg  BMI:  Body mass index is 28.83 kg/m.  Estimated Nutritional Needs:   Kcal:  2200-2400 kcal  Protein:  135-155 grams  Fluid:  >/= 2.2 L/day   Mariana Single RD, LDN Clinical Nutrition Pager # - [phone]

## 2019-05-01 NOTE — Progress Notes (Signed)
1 Day Post-Op  Subjective: CC: Hematuria.  Hx:  He is doing better s/p cysto with clot evac but still has some bleeding that required irrigation of a few small clots.  He is without complaints and the urine is clear on CBI this AM. ROS:  Review of Systems  Unable to perform ROS: Medical condition    Anti-infectives: Anti-infectives (From admission, onward)   Start     Dose/Rate Route Frequency Ordered Stop   04/30/19 1045  ceFAZolin (ANCEF) IVPB 2g/100 mL premix     2 g 200 mL/hr over 30 Minutes Intravenous  Once 04/30/19 1031 04/30/19 1120   04/30/19 1031  ceFAZolin (ANCEF) 2-4 GM/100ML-% IVPB    Note to Pharmacy: Henrine Screws   : cabinet override      04/30/19 1031 04/30/19 1108   04/26/19 1526  ciprofloxacin (CIPRO) IVPB 400 mg  Status:  Discontinued     400 mg 200 mL/hr over 60 Minutes Intravenous 60 min pre-op 04/26/19 1526 04/26/19 1531   04/25/19 0800  ciprofloxacin (CIPRO) tablet 500 mg     500 mg Oral Every 24 hours 04/24/19 1120 04/27/19 0959   04/22/19 1345  ceFAZolin (ANCEF) IVPB 2g/100 mL premix     2 g 200 mL/hr over 30 Minutes Intravenous To Radiology 04/22/19 1341 04/23/19 0959   04/22/19 1130  ciprofloxacin (CIPRO) tablet 500 mg  Status:  Discontinued     500 mg Oral 2 times daily 04/22/19 1124 04/24/19 1120   04/20/19 1600  cefTRIAXone (ROCEPHIN) 1 g in sodium chloride 0.9 % 100 mL IVPB  Status:  Discontinued     1 g 200 mL/hr over 30 Minutes Intravenous Every 24 hours 04/20/19 1456 04/22/19 1124   04/19/19 1200  ceFAZolin (ANCEF) IVPB 1 g/50 mL premix     1 g 100 mL/hr over 30 Minutes Intravenous  Once 04/18/19 1204 04/19/19 1311   04/13/19 2000  meropenem (MERREM) 500 mg in sodium chloride 0.9 % 100 mL IVPB     500 mg 200 mL/hr over 30 Minutes Intravenous Every 24 hours 04/12/19 1242 04/13/19 2134   04/12/19 1800  meropenem (MERREM) 1 g in sodium chloride 0.9 % 100 mL IVPB  Status:  Discontinued     1 g 200 mL/hr over 30 Minutes Intravenous Every 12 hours  04/12/19 0826 04/12/19 1236   04/10/19 1800  meropenem (MERREM) 2 g in sodium chloride 0.9 % 100 mL IVPB  Status:  Discontinued     2 g 200 mL/hr over 30 Minutes Intravenous Every 12 hours 04/10/19 1400 04/12/19 0826   04/10/19 1637  vancomycin (VANCOCIN) powder  Status:  Discontinued       As needed 04/10/19 1638 04/10/19 1836   04/07/19 1100  meropenem (MERREM) 1 g in sodium chloride 0.9 % 100 mL IVPB  Status:  Discontinued     1 g 200 mL/hr over 30 Minutes Intravenous 2 times daily 04/07/19 1011 04/07/19 1012   04/07/19 1100  meropenem (MERREM) 1 g in sodium chloride 0.9 % 100 mL IVPB  Status:  Discontinued     1 g 200 mL/hr over 30 Minutes Intravenous Every 8 hours 04/07/19 1012 04/10/19 1400   04/04/19 1700  vancomycin (VANCOCIN) IVPB 1000 mg/200 mL premix  Status:  Discontinued     1,000 mg 200 mL/hr over 60 Minutes Intravenous Every 24 hours 04/04/19 1612 04/06/19 1536   04/04/19 1000  vancomycin (VANCOREADY) IVPB 1250 mg/250 mL  Status:  Discontinued     1,250 mg  166.7 mL/hr over 90 Minutes Intravenous Every 24 hours 04/03/19 0925 04/04/19 1009   04/03/19 0930  vancomycin (VANCOREADY) IVPB 2000 mg/400 mL     2,000 mg 200 mL/hr over 120 Minutes Intravenous  Once 04/03/19 0925 04/03/19 1528   03/29/19 2200  cefTAZidime (FORTAZ) 2 g in sodium chloride 0.9 % 100 mL IVPB     2 g 200 mL/hr over 30 Minutes Intravenous Every 12 hours 03/29/19 1256 04/03/19 2244   03/28/19 1100  cefTAZidime (FORTAZ) 1 g in sodium chloride 0.9 % 100 mL IVPB  Status:  Discontinued     1 g 200 mL/hr over 30 Minutes Intravenous Every 24 hours 03/28/19 0950 03/28/19 0950   03/28/19 1100  cefTAZidime (FORTAZ) 2 g in sodium chloride 0.9 % 100 mL IVPB  Status:  Discontinued     2 g 200 mL/hr over 30 Minutes Intravenous Every 24 hours 03/28/19 0950 03/28/19 0951   03/28/19 1100  cefTAZidime (FORTAZ) 2 g in sodium chloride 0.9 % 100 mL IVPB  Status:  Discontinued     2 g 200 mL/hr over 30 Minutes Intravenous  Every 24 hours 03/28/19 0952 03/29/19 1256   03/26/19 1200  ceFEPIme (MAXIPIME) 2 g in sodium chloride 0.9 % 100 mL IVPB  Status:  Discontinued     2 g 200 mL/hr over 30 Minutes Intravenous Daily 03/26/19 1104 03/28/19 0950   03/19/19 1000  cefTRIAXone (ROCEPHIN) 1 g in sodium chloride 0.9 % 100 mL IVPB     1 g 200 mL/hr over 30 Minutes Intravenous Daily 03/19/19 0810 03/25/19 1117   03/16/19 2230  vancomycin (VANCOCIN) IVPB 1000 mg/200 mL premix     1,000 mg 200 mL/hr over 60 Minutes Intravenous  Once 03/16/19 1721 03/17/19 0024   03/16/19 1830  cefUROXime (ZINACEF) 1.5 g in sodium chloride 0.9 % 100 mL IVPB     1.5 g 200 mL/hr over 30 Minutes Intravenous Every 12 hours 03/16/19 1721 03/18/19 0557   03/16/19 0400  vancomycin (VANCOREADY) IVPB 1500 mg/300 mL  Status:  Discontinued     1,500 mg 150 mL/hr over 120 Minutes Intravenous To Surgery 03/15/19 1425 03/16/19 1715   03/16/19 0400  cefUROXime (ZINACEF) 1.5 g in sodium chloride 0.9 % 100 mL IVPB  Status:  Discontinued     1.5 g 200 mL/hr over 30 Minutes Intravenous To Surgery 03/15/19 1425 03/16/19 1715   03/16/19 0400  cefUROXime (ZINACEF) 750 mg in sodium chloride 0.9 % 100 mL IVPB     750 mg 200 mL/hr over 30 Minutes Intravenous To Surgery 03/15/19 1425 03/16/19 1540      Current Facility-Administered Medications  Medication Dose Route Frequency Provider Last Rate Last Admin  . 0.9 %  sodium chloride infusion  100 mL Intravenous PRN Irine Seal, MD      . 0.9 %  sodium chloride infusion  100 mL Intravenous PRN Irine Seal, MD      . 0.9 %  sodium chloride infusion   Intravenous Continuous Irine Seal, MD      . oxyCODONE (ROXICODONE) 5 MG/5ML solution 5 mg  5 mg Oral Q4H PRN Irine Seal, MD   5 mg at 04/29/19 2100   And  . acetaminophen (TYLENOL) 160 MG/5ML solution 325 mg  325 mg Oral Q4H PRN Irine Seal, MD   325 mg at 04/23/19 0726  . alteplase (CATHFLO ACTIVASE) injection 2 mg  2 mg Intracatheter Once PRN Irine Seal, MD       . amiodarone (PACERONE)  tablet 200 mg  200 mg Oral Daily Irine Seal, MD   200 mg at 04/29/19 1030  . aspirin chewable tablet 81 mg  81 mg Oral Daily Irine Seal, MD   81 mg at 04/29/19 1028  . atorvastatin (LIPITOR) tablet 80 mg  80 mg Oral q1800 Irine Seal, MD   80 mg at 04/30/19 1731  . calcium acetate (PHOSLO) capsule 667 mg  667 mg Oral TID WC Irine Seal, MD   667 mg at 04/30/19 1731  . chlorhexidine (PERIDEX) 0.12 % solution 15 mL  15 mL Mouth Rinse BID Irine Seal, MD   15 mL at 04/30/19 2321  . Chlorhexidine Gluconate Cloth 2 % PADS 6 each  6 each Topical Q0600 Irine Seal, MD   6 each at 04/30/19 [phone]  . Darbepoetin Alfa (ARANESP) injection 100 mcg  100 mcg Intravenous Q Tue-HD Prescott Gum, Peter, MD      . dextrose 50 % solution 0-50 mL  0-50 mL Intravenous PRN Irine Seal, MD      . feeding supplement (JEVITY 1.5 CAL/FIBER) liquid 1,000 mL  1,000 mL Oral Continuous Irine Seal, MD 40 mL/hr at 04/29/19 1533 1,000 mL at 04/29/19 1533  . feeding supplement (NEPRO CARB STEADY) liquid 237 mL  237 mL Oral BID BM Irine Seal, MD   237 mL at 04/30/19 1415  . feeding supplement (PRO-STAT SUGAR FREE 64) liquid 30 mL  30 mL Per Tube BID Irine Seal, MD   30 mL at 04/30/19 2324  . Gerhardt's butt cream   Topical BID Irine Seal, MD   1 application at 58/52/77 2322  . heparin injection 1,000 Units  1,000 Units Dialysis PRN Irine Seal, MD   3,800 Units at 04/27/19 1454  . heparin injection 1,700 Units  20 Units/kg Dialysis PRN Irine Seal, MD   1,700 Units at 04/27/19 1202  . hydrocortisone (ANUSOL-HC) 2.5 % rectal cream   Rectal BID Irine Seal, MD   Given at 04/30/19 2325  . hyoscyamine (LEVSIN SL) SL tablet 0.125 mg  0.125 mg Sublingual Q6H PRN Irine Seal, MD      . insulin aspart (novoLOG) injection 0-24 Units  0-24 Units Subcutaneous Q4H Irine Seal, MD   2 Units at 05/01/19 0459  . insulin glargine (LANTUS) injection 36 Units  36 Units Subcutaneous Daily Irine Seal, MD   36 Units at  04/29/19 1037  . levalbuterol (XOPENEX) nebulizer solution 0.63 mg  0.63 mg Nebulization Q6H PRN Irine Seal, MD   0.63 mg at 04/26/19 1353  . lidocaine (PF) (XYLOCAINE) 1 % injection 5 mL  5 mL Intradermal PRN Irine Seal, MD      . lidocaine-prilocaine (EMLA) cream 1 application  1 application Topical PRN Irine Seal, MD      . loperamide HCl (IMODIUM) 1 MG/7.5ML suspension 4 mg  4 mg Oral PRN Irine Seal, MD   4 mg at 04/26/19 2044  . magnesium sulfate IVPB 2 g 50 mL  2 g Intravenous Once Irine Seal, MD      . MEDLINE mouth rinse  15 mL Mouth Rinse q12n4p Irine Seal, MD   15 mL at 04/30/19 1600  . Melatonin TABS 3 mg  3 mg Oral QHS PRN Irine Seal, MD   3 mg at 04/29/19 0041  . midodrine (PROAMATINE) tablet 15 mg  15 mg Oral TID WC Irine Seal, MD   15 mg at 04/30/19 1731  . multivitamin (RENA-VIT) tablet 1 tablet  1 tablet Oral QHS Jeffie Pollock,  Jenny Reichmann, MD   1 tablet at 04/30/19 2324  . norepinephrine (LEVOPHED) 16 mg in 271mL premix infusion  2-10 mcg/min Intravenous Titrated Irine Seal, MD   Stopped at 04/24/19 1800  . ondansetron (ZOFRAN) injection 4 mg  4 mg Intravenous Q6H PRN Irine Seal, MD   4 mg at 04/30/19 1433  . pantoprazole (PROTONIX) injection 40 mg  40 mg Intravenous Q24H Irine Seal, MD   40 mg at 04/30/19 1730  . pentafluoroprop-tetrafluoroeth (GEBAUERS) aerosol 1 application  1 application Topical PRN Irine Seal, MD      . promethazine (PHENERGAN) injection 6.25 mg  6.25 mg Intravenous Q6H PRN Irine Seal, MD   6.25 mg at 04/24/19 0841  . Resource ThickenUp Clear   Oral PRN Irine Seal, MD   Given at 05/01/19 [phone]  . simethicone (MYLICON) chewable tablet 80 mg  80 mg Oral BID PC Irine Seal, MD   80 mg at 04/30/19 1731  . sodium chloride flush (NS) 0.9 % injection 10-40 mL  10-40 mL Intracatheter Q12H Irine Seal, MD   10 mL at 04/30/19 2325  . sodium chloride flush (NS) 0.9 % injection 10-40 mL  10-40 mL Intracatheter PRN Irine Seal, MD      . sodium chloride flush (NS) 0.9 %  injection 3 mL  3 mL Intravenous PRN Irine Seal, MD   3 mL at 04/16/19 0954     Objective: Vital signs in last 24 hours: Temp:  [97 F (36.1 C)-97.9 F (36.6 C)] 97.6 F (36.4 C) (03/16 0400) Pulse Rate:  [71-89] 74 (03/16 0700) Resp:  [12-27] 16 (03/16 0700) BP: (59-146)/(33-88) 102/33 (03/16 0700) SpO2:  [90 %-100 %] 93 % (03/16 0700) Weight:  [86 kg-87.4 kg] 86 kg (03/15 2309)  Intake/Output from previous day: 03/15 0701 - 03/16 0700 In: 70017 [P.O.:60; I.V.:100; NG/GT:680; IV Piggyback:100] Out: 49449 [QPRFF:63846; Blood:20; Chest Tube:1000] Intake/Output this shift: No intake/output data recorded.   Physical Exam Vitals reviewed.  Constitutional:      Appearance: Normal appearance.  Genitourinary:    Comments: Irrigant clear in foley tubing.  Neurological:     Mental Status: He is alert.     Lab Results:  Recent Labs    04/30/19 0337 04/30/19 1441  WBC 13.5* 17.6*  HGB 8.9* 8.5*  HCT 28.4* 26.7*  PLT 181 203   BMET Recent Labs    04/30/19 1441 05/01/19 0439  NA 132* 137  K 5.4* 4.7  CL 93* 98  CO2 24 28  GLUCOSE 141* 138*  BUN 98* 52*  CREATININE 5.83* 3.73*  CALCIUM 7.8* 7.8*   PT/INR No results for input(s): LABPROT, INR in the last 72 hours. ABG No results for input(s): PHART, HCO3 in the last 72 hours.  Invalid input(s): PCO2, PO2  Studies/Results: DG Chest Port 1 View  Result Date: 04/30/2019 CLINICAL DATA:  Tachycardia.  History of CABG. EXAM: PORTABLE CHEST 1 VIEW COMPARISON:  04/27/2019 FINDINGS: Left jugular dialysis catheter is stable with the tip in the SVC. Right arm PICC line terminates in the SVC. Stable position of the left chest tube. Markedly increased densities in the left hemithorax particularly in the left upper lung and left apical region. However, there is some lucency at the left lung apex and difficult to exclude pleural air in this area. Slightly increased hazy densities in the right lower lung. Cardiac silhouette  appears to be upper limits of normal. Persistent densities at the left lung base. Feeding tube extends into the abdomen. IMPRESSION: 1.  Markedly increased densities in the left hemithorax. Despite having a left chest tube, these densities could represent increased pleural fluid. Findings may also be related to significant volume loss and possible airspace disease. Cannot exclude a small amount of left pleural air at the apex. Left chest findings could be better evaluated with a chest CT. 2. Persistent densities at both lung bases could represent a combination atelectasis and pleural fluid. 3. Central venous catheters are adequately positioned. Electronically Signed   By: Markus Daft M.D.   On: 04/30/2019 08:06   Labs reviewed.  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  Continue CBI but try to reduce volume.    If he is still getting heparin with dialysis, that could be contributing to the persistent bleeding.  I would recommend avoiding that if possible.   If nephrology could consider DDAVP for possible platelet dysfunction, that might be helpful as well.     LOS: 48 days    Irine Seal 05/01/2019 [phone] ID: [name], male   DOB: [birth year], 86 y.o.   MRN: [id number]

## 2019-05-01 NOTE — Progress Notes (Signed)
Modified Barium Swallow Progress Note  Patient Details  Name: Jesse Murphy MRN: [id number] Date of Birth: January 17, 1929  Today's Date: 05/01/2019  Modified Barium Swallow completed.  Full report located under Chart Review in the Imaging Section.  Brief recommendations include the following:  Clinical Impression  Pt exhibits oropharyngeal dysphagia with laryngeal penetration with thin and nectar consistencies. Strength of swallow was adequate however timing of laryngeal deflection was late and nectar barium penetrated to his vocal cords without awareness and thin penetrated to the anterior laryngeal wall vestibule. In addition there may be reduced pressure as his stoma is partially patent evidenced by audible escapage of air and mucous from stoma. There was mild tongue base, vallecular and pyriform sinus residue. Pt gagging and coughing after first trial barium as reported he has been having gagging/emesis previously. Recommend he initiate Dys 1 texture, honey thick liquids, pills whole in applesauce and continued ST for safety and efficiency.      Swallow Evaluation Recommendations       SLP Diet Recommendations: Dysphagia 1 (Puree) solids;Honey thick liquids   Liquid Administration via: Cup   Medication Administration: Crushed with puree   Supervision: Patient able to self feed;Full supervision/cueing for compensatory strategies   Compensations: Slow rate;Small sips/bites;Lingual sweep for clearance of pocketing;Clear throat intermittently   Postural Changes: Seated upright at 90 degrees   Oral Care Recommendations: Oral care BID   Other Recommendations: Order thickener from pharmacy    [name] 05/01/2019,4:58 PM  [name].[name] Risk analyst [phone] Office [phone]

## 2019-05-02 LAB — CBC
HCT: 21.4 % — ABNORMAL LOW (ref 39.0–52.0)
HCT: 26.8 % — ABNORMAL LOW (ref 39.0–52.0)
Hemoglobin: 6.5 g/dL — CL (ref 13.0–17.0)
Hemoglobin: 8.5 g/dL — ABNORMAL LOW (ref 13.0–17.0)
MCH: 29.1 pg (ref 26.0–34.0)
MCH: 29.4 pg (ref 26.0–34.0)
MCHC: 30.4 g/dL (ref 30.0–36.0)
MCHC: 31.7 g/dL (ref 30.0–36.0)
MCV: 96 fL (ref 80.0–100.0)
MCV: 92.7 fL (ref 80.0–100.0)
Platelets: 182 10*3/uL (ref 150–400)
Platelets: 142 10*3/uL — ABNORMAL LOW (ref 150–400)
RBC: 2.23 MIL/uL — ABNORMAL LOW (ref 4.22–5.81)
RBC: 2.89 MIL/uL — ABNORMAL LOW (ref 4.22–5.81)
RDW: 16.3 % — ABNORMAL HIGH (ref 11.5–15.5)
RDW: 16.6 % — ABNORMAL HIGH (ref 11.5–15.5)
WBC: 10 10*3/uL (ref 4.0–10.5)
WBC: 9.2 10*3/uL (ref 4.0–10.5)
nRBC: 0 % (ref 0.0–0.2)
nRBC: 0 % (ref 0.0–0.2)

## 2019-05-02 LAB — RENAL FUNCTION PANEL
Albumin: 2.5 g/dL — ABNORMAL LOW (ref 3.5–5.0)
Anion gap: 14 (ref 5–15)
BUN: 79 mg/dL — ABNORMAL HIGH (ref 8–23)
CO2: 25 mmol/L (ref 22–32)
Calcium: 8.2 mg/dL — ABNORMAL LOW (ref 8.9–10.3)
Chloride: 98 mmol/L (ref 98–111)
Creatinine, Ser: 5.04 mg/dL — ABNORMAL HIGH (ref 0.61–1.24)
GFR calc Af Amer: 11 mL/min — ABNORMAL LOW (ref >60)
GFR calc non Af Amer: 9 mL/min — ABNORMAL LOW (ref >60)
Glucose, Bld: 94 mg/dL (ref 70–99)
Phosphorus: 5.7 mg/dL — ABNORMAL HIGH (ref 2.5–4.6)
Potassium: 5.2 mmol/L — ABNORMAL HIGH (ref 3.5–5.1)
Sodium: 137 mmol/L (ref 135–145)

## 2019-05-02 LAB — IRON AND TIBC
Iron: 34 ug/dL — ABNORMAL LOW (ref 45–182)
Saturation Ratios: 11 % — ABNORMAL LOW (ref 17.9–39.5)
TIBC: 308 ug/dL (ref 250–450)
UIBC: 274 ug/dL

## 2019-05-02 LAB — GLUCOSE, CAPILLARY
Glucose-Capillary: 107 mg/dL — ABNORMAL HIGH (ref 70–99)
Glucose-Capillary: 109 mg/dL — ABNORMAL HIGH (ref 70–99)
Glucose-Capillary: 89 mg/dL (ref 70–99)
Glucose-Capillary: 91 mg/dL (ref 70–99)
Glucose-Capillary: 92 mg/dL (ref 70–99)
Glucose-Capillary: 96 mg/dL (ref 70–99)

## 2019-05-02 LAB — FERRITIN: Ferritin: 242 ng/mL (ref 24–336)

## 2019-05-02 LAB — PREPARE RBC (CROSSMATCH)

## 2019-05-02 LAB — MAGNESIUM: Magnesium: 2.2 mg/dL (ref 1.7–2.4)

## 2019-05-02 MED ORDER — ALBUMIN HUMAN 5 % IV SOLN
INTRAVENOUS | Status: AC
  Filled 2019-05-02: qty 250

## 2019-05-02 MED ORDER — SODIUM CHLORIDE 0.9 % IV SOLN: 0.3000 ug/kg | Freq: Once | INTRAVENOUS | Status: DC

## 2019-05-02 MED ORDER — DIPHENHYDRAMINE HCL 50 MG/ML IJ SOLN
12.5000 mg | Freq: Four times a day (QID) | INTRAMUSCULAR | Status: DC | PRN
  Administered 2019-05-02 – 2019-05-03 (×3): 12.5 mg via INTRAVENOUS
  Filled 2019-05-02 (×2): qty 1

## 2019-05-02 MED ORDER — DARBEPOETIN ALFA 200 MCG/0.4ML IJ SOSY
200.0000 ug | PREFILLED_SYRINGE | INTRAMUSCULAR | Status: DC
  Administered 2019-05-02: 200 ug via INTRAVENOUS
  Filled 2019-05-02: qty 0.4

## 2019-05-02 MED ORDER — ALBUMIN HUMAN 25 % IV SOLN
25.0000 g | Freq: Once | INTRAVENOUS | Status: AC
  Administered 2019-05-02: 25 g via INTRAVENOUS
  Filled 2019-05-02: qty 50

## 2019-05-02 NOTE — Procedures (Signed)
[name]  Person Inserting Tube:  Esaw Dace, RD Tube Type:  [name] - 43 inches Tube Location:  Right nare Initial Placement:  Stomach Secured by: Bridle Technique Used to Measure Tube Placement:  Documented cm marking at nare/ corner of mouth [name] Secured At:  75 cm    [name] Tube Team Note:  Received page from [employer] indicating that [name] tube was clogged. [name] RD attempted to unclog tube but attempts unsuccessful. [name] RD unclipped bridle and clogged [name] removed from right nare. New [name] tube placed in left nare and clipped in place at 75 cm with current bridle.   No x-ray is required. RN may begin using tube.   If the tube becomes dislodged please keep the tube and contact the [name] team at [email or web address] ([email or web address]) for replacement.  If after hours and replacement cannot be delayed, place a NG tube and confirm placement with an abdominal x-ray.    Kerman Passey MS, RDN, LDN, CNSC RD Pager Number and Weekend/On-Call After Hours Pager Located in [place]

## 2019-05-02 NOTE — Progress Notes (Signed)
Patient ID: Jesse Murphy, male   DOB: Jul 30, 1928, 86 y.o.   MRN: [id number] TCTS Evening Rounds:  Hemodynamically stable in sinus rhythm.  Transfused 2 units cells today for anemia.  Bladder irrigation continues.  Had HD today.

## 2019-05-02 NOTE — Progress Notes (Signed)
  Speech Language Pathology Treatment: Dysphagia  Patient Details Name: Jesse Murphy MRN: [id number] DOB: 12/12/1928 Today's Date: 05/02/2019 Time: 7673-4193 SLP Time Calculation (min) (ACUTE ONLY): 15 min  Assessment / Plan / Recommendation Clinical Impression  Pt was seen for dysphagia treatment to assess tolerance of the recommended diet. He reported having nausea when he eats and stated that he does not eat/drink much because of this. Nursing reported that the pt exhibited some coughing this morning with honey liquids which had ice in it and was immediately given following mixing with ice. The possibility of the liquid being thinner than honey thick is considered and RN was advised of this. Pt tolerated puree solids and honey thick liquids via straw without overt s/sx of aspiration. However,  discoordination of swallowing with respiration is considered since he demonstrated inhalation at the end of the swallow with honey thick liquids. SLP will continue to follow pt for treatment.    HPI HPI: 86 y.o. male with PMH: h/o CAD s/p CABG and clipping of left atrial appendage, who presented with SOB on 2/1. He became hypoxic on 2/2 with pulmonary edema and needed to be intubated on 2/2 and extubated 2/5. Reintubated 2/8.  Pt underwent impella placement and tracheostomy on 2/9. Pt started on CRRT 2/11. Impella removal 2/23. Pt decannulated 3/13.       SLP Plan  Continue with current plan of care       Recommendations  Diet recommendations: Dysphagia 1 (puree);Honey-thick liquid Liquids provided via: Cup;Straw Medication Administration: Whole meds with puree Supervision: Full supervision/cueing for compensatory strategies;Staff to assist with self feeding Compensations: Slow rate;Small sips/bites;Lingual sweep for clearance of pocketing;Clear throat intermittently Postural Changes and/or Swallow Maneuvers: Seated upright 90 degrees                Oral Care Recommendations: Oral care  BID Follow up Recommendations: Skilled Nursing facility SLP Visit Diagnosis: Dysphagia, oropharyngeal phase (R13.12) Plan: Continue with current plan of care       Makailee Nudelman I. [name], [place], [place] Office number [phone] Pager [place] 05/02/2019, 1:34 PM

## 2019-05-02 NOTE — Progress Notes (Signed)
CRITICAL VALUE ALERT  Critical Value: HgB =6.5  Date & Time Notied: 05/02/19 @ 0830  Provider Notified: Dr. Jadene Pierini  Orders Received/Actions taken: 2 units RBCs

## 2019-05-02 NOTE — Progress Notes (Addendum)
[address]       [place] [address]             [phone]      2 Days Post-Op Procedure(s) (LRB): CYSTOSCOPY WITH FULGERATION (N/A) Subjective: H/H down further, will transfuse this am For dialysis today  Objective: Vital signs in last 24 hours: Temp:  [97.4 F (36.3 C)-97.8 F (36.6 C)] 97.7 F (36.5 C) (03/17 0400) Pulse Rate:  [65-84] 70 (03/17 0700) Cardiac Rhythm: Normal sinus rhythm (03/17 0400) Resp:  [12-24] 16 (03/17 0700) BP: (98-130)/(36-51) 106/43 (03/17 0700) SpO2:  [94 %-100 %] 95 % (03/17 0700) Weight:  [85.4 kg-85.7 kg] 85.7 kg (03/17 0605)  Hemodynamic parameters for last 24 hours: CVP:  [6 mmHg-9 mmHg] 8 mmHg  Intake/Output from previous day: 03/16 0701 - 03/17 0700 In: 98338 [P.O.:120; I.V.:31; NG/GT:40] Out: 25053 [Urine:37150] Intake/Output this shift: No intake/output data recorded.  General appearance: alert, cooperative and no distress Heart: regular rate and rhythm Lungs: dim in lower fields Abdomen: soft non tender Extremities: minor edema Wound: incis healing well  Lab Results: Recent Labs    04/30/19 1441 05/02/19 0657  WBC 17.6* 10.0  HGB 8.5* 6.5*  HCT 26.7* 21.4*  PLT 203 182   BMET:  Recent Labs    05/01/19 0439 05/02/19 0425  NA 137 137  K 4.7 5.2*  CL 98 98  CO2 28 25  GLUCOSE 138* 94  BUN 52* 79*  CREATININE 3.73* 5.04*  CALCIUM 7.8* 8.2*    PT/INR: No results for input(s): LABPROT, INR in the last 72 hours. ABG    Component Value Date/Time   PHART 7.488 (H) 04/07/2019 0405   HCO3 25.9 04/07/2019 0405   TCO2 36 (H) 04/23/2019 0449   ACIDBASEDEF 1.0 04/05/2019 0540   O2SAT 67.8 04/14/2019 1253   CBG (last 3)  Recent Labs    05/02/19 0043 05/02/19 0426 05/02/19 0705  GLUCAP 91 89 96    Meds Scheduled Meds: . amiodarone  200 mg Oral Daily  . aspirin  81 mg Oral Daily  . atorvastatin  80 mg Oral q1800  . calcium acetate  667 mg Oral TID WC  . chlorhexidine  15 mL  Mouth Rinse BID  . Chlorhexidine Gluconate Cloth  6 each Topical Q0600  . darbepoetin (ARANESP) injection - DIALYSIS  100 mcg Intravenous Q Wed-HD  . feeding supplement (NEPRO CARB STEADY)  237 mL Oral BID BM  . feeding supplement (PRO-STAT SUGAR FREE 64)  30 mL Per Tube BID  . Gerhardt's butt cream   Topical BID  . hydrocortisone   Rectal BID  . insulin aspart  0-24 Units Subcutaneous Q4H  . insulin glargine  36 Units Subcutaneous Daily  . lipase/protease/amylase)  20,880 Units Per Tube Once   And  . sodium bicarbonate  650 mg Per Tube Once  . mouth rinse  15 mL Mouth Rinse q12n4p  . midodrine  15 mg Oral TID WC  . multivitamin  1 tablet Oral QHS  . nutrition supplement (JUVEN)  1 packet Per Tube BID BM  . pantoprazole (PROTONIX) IV  40 mg Intravenous Q24H  . simethicone  80 mg Oral BID PC  . sodium chloride flush  10-40 mL Intracatheter Q12H   Continuous Infusions: . sodium chloride    . sodium chloride    . sodium chloride Stopped (05/01/19 1307)  . feeding supplement (JEVITY 1.5 CAL/FIBER) 1,000 mL (04/29/19 1533)  . magnesium sulfate bolus IVPB    .  norepinephrine (LEVOPHED) Adult infusion Stopped (04/24/19 1800)   PRN Meds:.sodium chloride, sodium chloride, oxyCODONE **AND** acetaminophen, alteplase, dextrose, hyoscyamine, levalbuterol, lidocaine (PF), lidocaine-prilocaine, loperamide HCl, Melatonin, ondansetron (ZOFRAN) IV, pentafluoroprop-tetrafluoroeth, promethazine, Resource ThickenUp Clear, sodium chloride flush, sodium chloride flush  Xrays DG Chest Port 1 View  Result Date: 05/01/2019 CLINICAL DATA:  Respiratory failure, post CABG EXAM: PORTABLE CHEST 1 VIEW COMPARISON:  04/30/2019 FINDINGS: Left dialysis catheter and feeding tube remain in place, unchanged. Postoperative changes. Right PICC line remains in place with the tip at the cavoatrial junction. Bilateral layering effusions and lower lobe opacities, similar to prior study. Improving aeration in the left upper  lobe. No pneumothorax. IMPRESSION: Continued layering effusions and bilateral lower lobe atelectasis or infiltrates. Improved aeration in the left upper lobe. No pneumothorax. Electronically Signed   By: Rolm Baptise M.D.   On: 05/01/2019 09:29   DG Swallowing Func-Speech Pathology  Result Date: 05/01/2019 Objective Swallowing Evaluation: Type of Study: MBS-Modified Barium Swallow Study  Patient Details Name: Jesse Murphy MRN: [id number] Date of Birth: 1928-09-20 Today's Date: 05/01/2019 Time: SLP Start Time (ACUTE ONLY): 1315 -SLP Stop Time (ACUTE ONLY): 1335 SLP Time Calculation (min) (ACUTE ONLY): 20 min Past Medical History: Past Medical History: Diagnosis Date . Atrial fibrillation ([place])  . CAD (coronary artery disease)   a. s/p PCI in 1992 b. Coronary CT in 01/2018 showing extensive coronary calcification; FFR indeterminate --> medical management pursued at that time as patient not interested in repeat cath . Cancer [facility])   prostate . Dyspnea  . Hypertension  Past Surgical History: Past Surgical History: Procedure Laterality Date . CHEST TUBE INSERTION Left 04/23/2019  Procedure: INSERTION PLEURAL DRAINAGE CATHETER TO DRAIN LEFT PLEURAL EFFUSION;  Surgeon: Ivin Poot, MD;  Location: [place];  Service: Thoracic;  Laterality: Left; . CLIPPING OF ATRIAL APPENDAGE N/A 03/16/2019  Procedure: CLIPPING OF LEFT  ATRIAL APPENDAGE - USING ATRICLIP SIZE 40;  Surgeon: Ivin Poot, MD;  Location: [place];  Service: Open Heart Surgery;  Laterality: N/A; . COLONOSCOPY N/A 08/25/2017  Procedure: COLONOSCOPY;  Surgeon: Rogene Houston, MD;  [facility];  Service: Endoscopy;  Laterality: N/A; . CORONARY ARTERY BYPASS GRAFT N/A 03/16/2019  Procedure: CORONARY ARTERY BYPASS GRAFTING (CABG), ON PUMP, TIMES THREE, USING LEFT INTERNAL MAMMARY ARTERY, RIGHT GREATER SAPHENOUS VEIN HARVESTED ENDOSCOPICALLY;  Surgeon: Ivin Poot, MD;  Location: [place];  Service: Open Heart Surgery;  Laterality: N/A;  swan only .  CYSTOSCOPY WITH FULGERATION N/A 04/30/2019  Procedure: CYSTOSCOPY WITH FULGERATION;  Surgeon: Irine Seal, MD;  Location: [place];  Service: Urology;  Laterality: N/A; . HERNIA REPAIR   . IR FLUORO GUIDE CV LINE LEFT  04/23/2019 . IR US GUIDE VASC ACCESS LEFT  04/23/2019 . MAZE N/A 03/16/2019  Procedure: MAZE;  Surgeon: Ivin Poot, MD;  Location: [place];  Service: Open Heart Surgery;  Laterality: N/A; . PLACEMENT OF IMPELLA LEFT VENTRICULAR ASSIST DEVICE Right 03/27/2019  Procedure: Placement Of Impella Left Ventricular Assist Device using ABIOMED Impella 5.5 with SmartAssist Device.;  Surgeon: Wonda Olds, MD;  Location[facility];  Service: Thoracic;  Laterality: Right; . PROSTATE SURGERY   . REMOVAL OF IMPELLA LEFT VENTRICULAR ASSIST DEVICE N/A 04/10/2019  Procedure: REMOVAL OF IMPELLA LEFT VENTRICULAR ASSIST DEVICE WITH INSERTION OF RIGHT FEMORAL ARTERIAL LINE;  Surgeon: Ivin Poot, MD;  Location: [place];  Service: Open Heart Surgery;  Laterality: N/A; . RIGHT/LEFT HEART CATH AND CORONARY ANGIOGRAPHY N/A 03/15/2019  Procedure: RIGHT/LEFT HEART  CATH AND CORONARY ANGIOGRAPHY;  Surgeon: Burnell Blanks, MD;  Location: [place] [facility];  Service: Cardiovascular;  Laterality: N/A; . TEE WITHOUT CARDIOVERSION N/A 03/16/2019  Procedure: TRANSESOPHAGEAL ECHOCARDIOGRAM (TEE);  Surgeon: Prescott Gum, Collier Salina, MD;  Location: [place];  Service: Open Heart Surgery;  Laterality: N/A; . TRACHEOSTOMY TUBE PLACEMENT N/A 03/27/2019  Procedure: TRACHEOSTOMY placed using Shiley 8DCT Cuffed.;  Surgeon: Wonda Olds, MD;  Location[facility];  Service: Thoracic;  Laterality: N/A; HPI: 86 y.o. male with PMH: h/o CAD s/p CABG and clipping of left atrial appendage, who presented with SOB on 2/1. He became hypoxic on 2/2 with pulmonary edema and needed to be intubated on 2/2 and extubated 2/5. Reintubated 2/8.  Pt underwent impella placement and tracheostomy on 2/9. Pt started on CRRT 2/11. Impella removal 2/23. Pt decannulated 3/13.   Subjective: pleasant and comfortable Assessment / Plan / Recommendation CHL IP CLINICAL IMPRESSIONS 05/01/2019 Clinical Impression Pt exhibits oropharyngeal dysphagia with laryngeal penetration with thin and nectar consistencies. Strength of swallow was adequate however timing of laryngeal deflection was late and nectar barium penetrated to his vocal cords without awareness and thin penetrated to the anterior laryngeal wall vestibule. In addition there may be reduced pressure as his stoma is partially patent evidenced by audible escapage of air and mucous from stoma. There was mild tongue base, vallecular and pyriform sinus residue. Pt gagging and coughing after first trial barium as reported he has been having gagging/emesis previously. Recommend he initiate Dys 1 texture, honey thick liquids, pills whole in applesauce and continued ST for safety and efficiency.    SLP Visit Diagnosis Dysphagia, oropharyngeal phase (R13.12) Attention and concentration deficit following -- Frontal lobe and executive function deficit following -- Impact on safety and function Moderate aspiration risk   CHL IP TREATMENT RECOMMENDATION 05/01/2019 Treatment Recommendations Therapy as outlined in treatment plan below   Prognosis 05/01/2019 Prognosis for Safe Diet Advancement Good Barriers to Reach Goals -- Barriers/Prognosis Comment -- CHL IP DIET RECOMMENDATION 05/01/2019 SLP Diet Recommendations Dysphagia 1 (Puree) solids;Honey thick liquids Liquid Administration via Cup Medication Administration Crushed with puree Compensations Slow rate;Small sips/bites;Lingual sweep for clearance of pocketing;Clear throat intermittently Postural Changes Seated upright at 90 degrees   CHL IP OTHER RECOMMENDATIONS 05/01/2019 Recommended Consults -- Oral Care Recommendations Oral care BID Other Recommendations Order thickener from pharmacy   CHL IP FOLLOW UP RECOMMENDATIONS 05/01/2019 Follow up Recommendations Skilled Nursing facility   [facility] IP FREQUENCY AND  DURATION 05/01/2019 Speech Therapy Frequency (ACUTE ONLY) min 2x/week Treatment Duration 2 weeks      CHL IP ORAL PHASE 05/01/2019 Oral Phase Impaired Oral - Pudding Teaspoon -- Oral - Pudding Cup -- Oral - Honey Teaspoon -- Oral - Honey Cup Lingual/palatal residue Oral - Nectar Teaspoon -- Oral - Nectar Cup Lingual/palatal residue Oral - Nectar Straw -- Oral - Thin Teaspoon -- Oral - Thin Cup Decreased bolus cohesion Oral - Thin Straw -- Oral - Puree Delayed oral transit;Lingual/palatal residue Oral - Mech Soft -- Oral - Regular -- Oral - Multi-Consistency -- Oral - Pill -- Oral Phase - Comment --  CHL IP PHARYNGEAL PHASE 05/01/2019 Pharyngeal Phase Impaired Pharyngeal- Pudding Teaspoon -- Pharyngeal -- Pharyngeal- Pudding Cup -- Pharyngeal -- Pharyngeal- Honey Teaspoon -- Pharyngeal -- Pharyngeal- Honey Cup WFL Pharyngeal -- Pharyngeal- Nectar Teaspoon -- Pharyngeal -- Pharyngeal- Nectar Cup Penetration/Aspiration during swallow Pharyngeal Material enters airway, passes BELOW cords without attempt by patient to eject out (silent aspiration);Material enters airway, CONTACTS cords and not ejected  out Pharyngeal- Nectar Straw NT Pharyngeal -- Pharyngeal- Thin Teaspoon -- Pharyngeal -- Pharyngeal- Thin Cup Penetration/Aspiration during swallow Pharyngeal Material enters airway, remains ABOVE vocal cords and not ejected out Pharyngeal- Thin Straw -- Pharyngeal -- Pharyngeal- Puree Pharyngeal residue - valleculae;Penetration/Aspiration during swallow Pharyngeal -- Pharyngeal- Mechanical Soft -- Pharyngeal -- Pharyngeal- Regular -- Pharyngeal -- Pharyngeal- Multi-consistency -- Pharyngeal -- Pharyngeal- Pill -- Pharyngeal -- Pharyngeal Comment --  CHL IP CERVICAL ESOPHAGEAL PHASE 05/01/2019 Cervical Esophageal Phase WFL Pudding Teaspoon -- Pudding Cup -- Honey Teaspoon -- Honey Cup -- Nectar Teaspoon -- Nectar Cup -- Nectar Straw -- Thin Teaspoon -- Thin Cup -- Thin Straw -- Puree -- Mechanical Soft -- Regular --  Multi-consistency -- Pill -- Cervical Esophageal Comment -- [name] 05/01/2019, 4:58 PM               Assessment/Plan: S/P Procedure(s) (LRB): CYSTOSCOPY WITH FULGERATION (N/A)  1 for dialysis today-management per nephrology 2 hematuria conts to improve- urology managing 3 transfuse for HGB 6.5 4 AHF managing systolic HF 5 new rash on legs -pruritic- poss drug rxn, hydroxyzine for now 6 dys 1 diet per ST following MBS    LOS: 49 days    John Giovanni PA-C Pager [phone] 05/02/2019  Patient will benefit from transfusion for hemoglobin 6.5 Plan drainage of left Pleurx catheter on Thursday Continue bladder irrigation and drainage  patient examined and medical record reviewed,agree with above note. Tharon Aquas Trigt III 05/02/2019

## 2019-05-02 NOTE — Progress Notes (Signed)
Patient ID: Jesse Murphy, male   DOB: June 12, 1928, 86 y.o.   MRN: [id number] 2 Days Post-Op  Subjective: The bleeding has improved significantly but he still has the occasional clot.    ROS:  Review of Systems  Unable to perform ROS: Medical condition    Anti-infectives: Anti-infectives (From admission, onward)   Start     Dose/Rate Route Frequency Ordered Stop   04/30/19 1045  ceFAZolin (ANCEF) IVPB 2g/100 mL premix     2 g 200 mL/hr over 30 Minutes Intravenous  Once 04/30/19 1031 04/30/19 1120   04/30/19 1031  ceFAZolin (ANCEF) 2-4 GM/100ML-% IVPB    Note to Pharmacy: Henrine Screws   : cabinet override      04/30/19 1031 04/30/19 1108   04/26/19 1526  ciprofloxacin (CIPRO) IVPB 400 mg  Status:  Discontinued     400 mg 200 mL/hr over 60 Minutes Intravenous 60 min pre-op 04/26/19 1526 04/26/19 1531   04/25/19 0800  ciprofloxacin (CIPRO) tablet 500 mg     500 mg Oral Every 24 hours 04/24/19 1120 04/27/19 0959   04/22/19 1345  ceFAZolin (ANCEF) IVPB 2g/100 mL premix     2 g 200 mL/hr over 30 Minutes Intravenous To Radiology 04/22/19 1341 04/23/19 0959   04/22/19 1130  ciprofloxacin (CIPRO) tablet 500 mg  Status:  Discontinued     500 mg Oral 2 times daily 04/22/19 1124 04/24/19 1120   04/20/19 1600  cefTRIAXone (ROCEPHIN) 1 g in sodium chloride 0.9 % 100 mL IVPB  Status:  Discontinued     1 g 200 mL/hr over 30 Minutes Intravenous Every 24 hours 04/20/19 1456 04/22/19 1124   04/19/19 1200  ceFAZolin (ANCEF) IVPB 1 g/50 mL premix     1 g 100 mL/hr over 30 Minutes Intravenous  Once 04/18/19 1204 04/19/19 1311   04/13/19 2000  meropenem (MERREM) 500 mg in sodium chloride 0.9 % 100 mL IVPB     500 mg 200 mL/hr over 30 Minutes Intravenous Every 24 hours 04/12/19 1242 04/13/19 2134   04/12/19 1800  meropenem (MERREM) 1 g in sodium chloride 0.9 % 100 mL IVPB  Status:  Discontinued     1 g 200 mL/hr over 30 Minutes Intravenous Every 12 hours 04/12/19 0826 04/12/19 1236   04/10/19 1800   meropenem (MERREM) 2 g in sodium chloride 0.9 % 100 mL IVPB  Status:  Discontinued     2 g 200 mL/hr over 30 Minutes Intravenous Every 12 hours 04/10/19 1400 04/12/19 0826   04/10/19 1637  vancomycin (VANCOCIN) powder  Status:  Discontinued       As needed 04/10/19 1638 04/10/19 1836   04/07/19 1100  meropenem (MERREM) 1 g in sodium chloride 0.9 % 100 mL IVPB  Status:  Discontinued     1 g 200 mL/hr over 30 Minutes Intravenous 2 times daily 04/07/19 1011 04/07/19 1012   04/07/19 1100  meropenem (MERREM) 1 g in sodium chloride 0.9 % 100 mL IVPB  Status:  Discontinued     1 g 200 mL/hr over 30 Minutes Intravenous Every 8 hours 04/07/19 1012 04/10/19 1400   04/04/19 1700  vancomycin (VANCOCIN) IVPB 1000 mg/200 mL premix  Status:  Discontinued     1,000 mg 200 mL/hr over 60 Minutes Intravenous Every 24 hours 04/04/19 1612 04/06/19 1536   04/04/19 1000  vancomycin (VANCOREADY) IVPB 1250 mg/250 mL  Status:  Discontinued     1,250 mg 166.7 mL/hr over 90 Minutes Intravenous Every 24 hours 04/03/19  0093 04/04/19 1009   04/03/19 0930  vancomycin (VANCOREADY) IVPB 2000 mg/400 mL     2,000 mg 200 mL/hr over 120 Minutes Intravenous  Once 04/03/19 0925 04/03/19 1528   03/29/19 2200  cefTAZidime (FORTAZ) 2 g in sodium chloride 0.9 % 100 mL IVPB     2 g 200 mL/hr over 30 Minutes Intravenous Every 12 hours 03/29/19 1256 04/03/19 2244   03/28/19 1100  cefTAZidime (FORTAZ) 1 g in sodium chloride 0.9 % 100 mL IVPB  Status:  Discontinued     1 g 200 mL/hr over 30 Minutes Intravenous Every 24 hours 03/28/19 0950 03/28/19 0950   03/28/19 1100  cefTAZidime (FORTAZ) 2 g in sodium chloride 0.9 % 100 mL IVPB  Status:  Discontinued     2 g 200 mL/hr over 30 Minutes Intravenous Every 24 hours 03/28/19 0950 03/28/19 0951   03/28/19 1100  cefTAZidime (FORTAZ) 2 g in sodium chloride 0.9 % 100 mL IVPB  Status:  Discontinued     2 g 200 mL/hr over 30 Minutes Intravenous Every 24 hours 03/28/19 0952 03/29/19 1256    03/26/19 1200  ceFEPIme (MAXIPIME) 2 g in sodium chloride 0.9 % 100 mL IVPB  Status:  Discontinued     2 g 200 mL/hr over 30 Minutes Intravenous Daily 03/26/19 1104 03/28/19 0950   03/19/19 1000  cefTRIAXone (ROCEPHIN) 1 g in sodium chloride 0.9 % 100 mL IVPB     1 g 200 mL/hr over 30 Minutes Intravenous Daily 03/19/19 0810 03/25/19 1117   03/16/19 2230  vancomycin (VANCOCIN) IVPB 1000 mg/200 mL premix     1,000 mg 200 mL/hr over 60 Minutes Intravenous  Once 03/16/19 1721 03/17/19 0024   03/16/19 1830  cefUROXime (ZINACEF) 1.5 g in sodium chloride 0.9 % 100 mL IVPB     1.5 g 200 mL/hr over 30 Minutes Intravenous Every 12 hours 03/16/19 1721 03/18/19 0557   03/16/19 0400  vancomycin (VANCOREADY) IVPB 1500 mg/300 mL  Status:  Discontinued     1,500 mg 150 mL/hr over 120 Minutes Intravenous To Surgery 03/15/19 1425 03/16/19 1715   03/16/19 0400  cefUROXime (ZINACEF) 1.5 g in sodium chloride 0.9 % 100 mL IVPB  Status:  Discontinued     1.5 g 200 mL/hr over 30 Minutes Intravenous To Surgery 03/15/19 1425 03/16/19 1715   03/16/19 0400  cefUROXime (ZINACEF) 750 mg in sodium chloride 0.9 % 100 mL IVPB     750 mg 200 mL/hr over 30 Minutes Intravenous To Surgery 03/15/19 1425 03/16/19 1540      Current Facility-Administered Medications  Medication Dose Route Frequency Provider Last Rate Last Admin  . 0.9 %  sodium chloride infusion  100 mL Intravenous PRN Irine Seal, MD      . 0.9 %  sodium chloride infusion  100 mL Intravenous PRN Irine Seal, MD      . 0.9 %  sodium chloride infusion   Intravenous Continuous Irine Seal, MD   Stopped at 05/01/19 1307  . oxyCODONE (ROXICODONE) 5 MG/5ML solution 5 mg  5 mg Oral Q4H PRN Irine Seal, MD   5 mg at 04/29/19 2100   And  . acetaminophen (TYLENOL) 160 MG/5ML solution 325 mg  325 mg Oral Q4H PRN Irine Seal, MD   325 mg at 04/23/19 0726  . alteplase (CATHFLO ACTIVASE) injection 2 mg  2 mg Intracatheter Once PRN Irine Seal, MD      . amiodarone  (PACERONE) tablet 200 mg  200 mg Oral Daily  Irine Seal, MD   200 mg at 05/01/19 0900  . aspirin chewable tablet 81 mg  81 mg Oral Daily Irine Seal, MD   81 mg at 05/01/19 0900  . atorvastatin (LIPITOR) tablet 80 mg  80 mg Oral q1800 Irine Seal, MD   80 mg at 05/01/19 1827  . calcium acetate (PHOSLO) capsule 667 mg  667 mg Oral TID WC Irine Seal, MD   667 mg at 05/01/19 1730  . chlorhexidine (PERIDEX) 0.12 % solution 15 mL  15 mL Mouth Rinse BID Irine Seal, MD   15 mL at 05/01/19 2227  . Chlorhexidine Gluconate Cloth 2 % PADS 6 each  6 each Topical Q0600 Irine Seal, MD   6 each at 05/01/19 1500  . Darbepoetin Alfa (ARANESP) injection 100 mcg  100 mcg Intravenous Q Wed-HD Prescott Gum, Peter, MD      . dextrose 50 % solution 0-50 mL  0-50 mL Intravenous PRN Irine Seal, MD      . feeding supplement (JEVITY 1.5 CAL/FIBER) liquid 1,000 mL  1,000 mL Oral Continuous Irine Seal, MD 40 mL/hr at 04/29/19 1533 1,000 mL at 04/29/19 1533  . feeding supplement (NEPRO CARB STEADY) liquid 237 mL  237 mL Oral BID BM Irine Seal, MD   237 mL at 05/01/19 1036  . feeding supplement (PRO-STAT SUGAR FREE 64) liquid 30 mL  30 mL Per Tube BID Irine Seal, MD   30 mL at 05/01/19 0902  . Gerhardt's butt cream   Topical BID Irine Seal, MD   Given at 05/01/19 2103  . hydrocortisone (ANUSOL-HC) 2.5 % rectal cream   Rectal BID Irine Seal, MD   Given at 05/01/19 2103  . hyoscyamine (LEVSIN SL) SL tablet 0.125 mg  0.125 mg Sublingual Q6H PRN Irine Seal, MD      . insulin aspart (novoLOG) injection 0-24 Units  0-24 Units Subcutaneous Q4H Irine Seal, MD   2 Units at 05/01/19 1442  . insulin glargine (LANTUS) injection 36 Units  36 Units Subcutaneous Daily Irine Seal, MD   36 Units at 05/01/19 1019  . levalbuterol (XOPENEX) nebulizer solution 0.63 mg  0.63 mg Nebulization Q6H PRN Irine Seal, MD   0.63 mg at 04/26/19 1353  . lidocaine (PF) (XYLOCAINE) 1 % injection 5 mL  5 mL Intradermal PRN Irine Seal, MD      .  lidocaine-prilocaine (EMLA) cream 1 application  1 application Topical PRN Irine Seal, MD      . lipase/protease/amylase) Rosann Auerbach) tablets 20,880 Units  20,880 Units Per Tube Once Prescott Gum, Collier Salina, MD       And  . sodium bicarbonate tablet 650 mg  650 mg Per Tube Once Prescott Gum, Collier Salina, MD      . loperamide HCl (IMODIUM) 1 MG/7.5ML suspension 4 mg  4 mg Oral PRN Irine Seal, MD   4 mg at 04/26/19 2044  . magnesium sulfate IVPB 2 g 50 mL  2 g Intravenous Once Irine Seal, MD      . MEDLINE mouth rinse  15 mL Mouth Rinse q12n4p Irine Seal, MD   15 mL at 05/01/19 1600  . Melatonin TABS 3 mg  3 mg Oral QHS PRN Irine Seal, MD   3 mg at 05/01/19 2221  . midodrine (PROAMATINE) tablet 15 mg  15 mg Oral TID WC Irine Seal, MD   15 mg at 05/01/19 1730  . multivitamin (RENA-VIT) tablet 1 tablet  1 tablet Oral QHS Irine Seal, MD   1 tablet  at 04/30/19 2324  . norepinephrine (LEVOPHED) 16 mg in 215mL premix infusion  2-10 mcg/min Intravenous Titrated Irine Seal, MD   Stopped at 04/24/19 1800  . nutrition supplement (JUVEN) (JUVEN) powder packet 1 packet  1 packet Per Tube BID BM Ivin Poot, MD   1 packet at 05/01/19 1500  . ondansetron (ZOFRAN) injection 4 mg  4 mg Intravenous Q6H PRN Irine Seal, MD   4 mg at 05/01/19 1438  . pantoprazole (PROTONIX) injection 40 mg  40 mg Intravenous Q24H Irine Seal, MD   40 mg at 05/01/19 1731  . pentafluoroprop-tetrafluoroeth (GEBAUERS) aerosol 1 application  1 application Topical PRN Irine Seal, MD      . promethazine (PHENERGAN) injection 6.25 mg  6.25 mg Intravenous Q6H PRN Irine Seal, MD   6.25 mg at 04/24/19 0841  . Resource ThickenUp Clear   Oral PRN Irine Seal, MD   Given at 05/02/19 [phone]  . simethicone (MYLICON) chewable tablet 80 mg  80 mg Oral BID PC Irine Seal, MD   80 mg at 05/01/19 1827  . sodium chloride flush (NS) 0.9 % injection 10-40 mL  10-40 mL Intracatheter Q12H Irine Seal, MD   10 mL at 05/01/19 2104  . sodium chloride flush (NS) 0.9 %  injection 10-40 mL  10-40 mL Intracatheter PRN Irine Seal, MD      . sodium chloride flush (NS) 0.9 % injection 3 mL  3 mL Intravenous PRN Irine Seal, MD   3 mL at 04/16/19 0954     Objective: Vital signs in last 24 hours: Temp:  [97.4 F (36.3 C)-97.8 F (36.6 C)] 97.7 F (36.5 C) (03/17 0400) Pulse Rate:  [65-84] 70 (03/17 0700) Resp:  [12-24] 16 (03/17 0700) BP: (89-130)/(33-51) 106/43 (03/17 0700) SpO2:  [94 %-100 %] 95 % (03/17 0700) Weight:  [85.4 kg-85.7 kg] 85.7 kg (03/17 0605)  Intake/Output from previous day: 03/16 0701 - 03/17 0700 In: [place] [P.O.:120; I.V.:31; NG/GT:40] Out: 43329 [Urine:37150] Intake/Output this shift: No intake/output data recorded.   Physical Exam Vitals reviewed.  Constitutional:      Appearance: Normal appearance.  Genitourinary:    Comments: Irrigant in foley tube is clear but there is a small clot in the line.  CBI is on a slow to moderate rate.  Neurological:     Mental Status: He is alert.     Lab Results:  Recent Labs    04/30/19 1441 05/02/19 0657  WBC 17.6* 10.0  HGB 8.5* 6.5*  HCT 26.7* 21.4*  PLT 203 182   BMET Recent Labs    05/01/19 0439 05/02/19 0425  NA 137 137  K 4.7 5.2*  CL 98 98  CO2 28 25  GLUCOSE 138* 94  BUN 52* 79*  CREATININE 3.73* 5.04*  CALCIUM 7.8* 8.2*   PT/INR No results for input(s): LABPROT, INR in the last 72 hours. ABG No results for input(s): PHART, HCO3 in the last 72 hours.  Invalid input(s): PCO2, PO2  Studies/Results: DG Chest Port 1 View  Result Date: 05/01/2019 CLINICAL DATA:  Respiratory failure, post CABG EXAM: PORTABLE CHEST 1 VIEW COMPARISON:  04/30/2019 FINDINGS: Left dialysis catheter and feeding tube remain in place, unchanged. Postoperative changes. Right PICC line remains in place with the tip at the cavoatrial junction. Bilateral layering effusions and lower lobe opacities, similar to prior study. Improving aeration in the left upper lobe. No pneumothorax.  IMPRESSION: Continued layering effusions and bilateral lower lobe atelectasis or infiltrates. Improved aeration in the left  upper lobe. No pneumothorax. Electronically Signed   By: Rolm Baptise M.D.   On: 05/01/2019 09:29   DG Swallowing Func-Speech Pathology  Result Date: 05/01/2019 Objective Swallowing Evaluation: Type of Study: MBS-Modified Barium Swallow Study  Patient Details Name: AUDRA BELLARD MRN: [id number] Date of Birth: February 10, 1929 Today's Date: 05/01/2019 Time: SLP Start Time (ACUTE ONLY): 1315 -SLP Stop Time (ACUTE ONLY): 1335 SLP Time Calculation (min) (ACUTE ONLY): 20 min Past Medical History: Past Medical History: Diagnosis Date . Atrial fibrillation ([place])  . CAD (coronary artery disease)   a. s/p PCI in 1992 b. Coronary CT in 01/2018 showing extensive coronary calcification; FFR indeterminate --> medical management pursued at that time as patient not interested in repeat cath . [facility])   prostate . Dyspnea  . Hypertension  Past Surgical History: Past Surgical History: Procedure Laterality Date . CHEST TUBE INSERTION Left 04/23/2019  Procedure: INSERTION PLEURAL DRAINAGE CATHETER TO DRAIN LEFT PLEURAL EFFUSION;  Surgeon: Ivin Poot, MD;  Location: [place];  Service: Thoracic;  Laterality: Left; . CLIPPING OF ATRIAL APPENDAGE N/A 03/16/2019  Procedure: CLIPPING OF LEFT  ATRIAL APPENDAGE - USING ATRICLIP SIZE 40;  Surgeon: Ivin Poot, MD;  Location: [place];  Service: Open Heart Surgery;  Laterality: N/A; . COLONOSCOPY N/A 08/25/2017  Procedure: COLONOSCOPY;  Surgeon: Rogene Houston, MD;  [facility];  Service: Endoscopy;  Laterality: N/A; . CORONARY ARTERY BYPASS GRAFT N/A 03/16/2019  Procedure: CORONARY ARTERY BYPASS GRAFTING (CABG), ON PUMP, TIMES THREE, USING LEFT INTERNAL MAMMARY ARTERY, RIGHT GREATER SAPHENOUS VEIN HARVESTED ENDOSCOPICALLY;  Surgeon: Ivin Poot, MD;  Location: [place];  Service: Open Heart Surgery;  Laterality: N/A;  swan only . CYSTOSCOPY WITH  FULGERATION N/A 04/30/2019  Procedure: CYSTOSCOPY WITH FULGERATION;  Surgeon: Irine Seal, MD;  Location: [facility];  Service: Urology;  Laterality: N/A; . HERNIA REPAIR   . IR FLUORO GUIDE CV LINE LEFT  04/23/2019 . IR US GUIDE VASC ACCESS LEFT  04/23/2019 . MAZE N/A 03/16/2019  Procedure: MAZE;  Surgeon: Ivin Poot, MD;  Location: [place];  Service: Open Heart Surgery;  Laterality: N/A; . PLACEMENT OF IMPELLA LEFT VENTRICULAR ASSIST DEVICE Right 03/27/2019  Procedure: Placement Of Impella Left Ventricular Assist Device using ABIOMED Impella 5.5 with SmartAssist Device.;  Surgeon: Wonda Olds, MD;  Location: [facility];  Service: Thoracic;  Laterality: Right; . PROSTATE SURGERY   . REMOVAL OF IMPELLA LEFT VENTRICULAR ASSIST DEVICE N/A 04/10/2019  Procedure: REMOVAL OF IMPELLA LEFT VENTRICULAR ASSIST DEVICE WITH INSERTION OF RIGHT FEMORAL ARTERIAL LINE;  Surgeon: Ivin Poot, MD;  Location: [place];  Service: Open Heart Surgery;  Laterality: N/A; . RIGHT/LEFT HEART CATH AND CORONARY ANGIOGRAPHY N/A 03/15/2019  Procedure: RIGHT/LEFT HEART CATH AND CORONARY ANGIOGRAPHY;  Surgeon: Burnell Blanks, MD;  Location: [place] [facility];  Service: Cardiovascular;  Laterality: N/A; . TEE WITHOUT CARDIOVERSION N/A 03/16/2019  Procedure: TRANSESOPHAGEAL ECHOCARDIOGRAM (TEE);  Surgeon: Prescott Gum, Collier Salina, MD;  Location: [place];  Service: Open Heart Surgery;  Laterality: N/A; . TRACHEOSTOMY TUBE PLACEMENT N/A 03/27/2019  Procedure: TRACHEOSTOMY placed using [place] 8DCT Cuffed.;  Surgeon: Wonda Olds, MD;  Location: [facility];  Service: Thoracic;  Laterality: N/A; HPI: 86 y.o. male with PMH: h/o CAD s/p CABG and clipping of left atrial appendage, who presented with SOB on 2/1. He became hypoxic on 2/2 with pulmonary edema and needed to be intubated on 2/2 and extubated 2/5. Reintubated 2/8.  Pt underwent impella placement and tracheostomy  on 2/9. Pt started on CRRT 2/11. Impella removal 2/23. Pt decannulated 3/13.  Subjective:  pleasant and comfortable Assessment / Plan / Recommendation CHL IP CLINICAL IMPRESSIONS 05/01/2019 Clinical Impression Pt exhibits oropharyngeal dysphagia with laryngeal penetration with thin and nectar consistencies. Strength of swallow was adequate however timing of laryngeal deflection was late and nectar barium penetrated to his vocal cords without awareness and thin penetrated to the anterior laryngeal wall vestibule. In addition there may be reduced pressure as his stoma is partially patent evidenced by audible escapage of air and mucous from stoma. There was mild tongue base, vallecular and pyriform sinus residue. Pt gagging and coughing after first trial barium as reported he has been having gagging/emesis previously. Recommend he initiate Dys 1 texture, honey thick liquids, pills whole in applesauce and continued ST for safety and efficiency.    SLP Visit Diagnosis Dysphagia, oropharyngeal phase (R13.12) Attention and concentration deficit following -- Frontal lobe and executive function deficit following -- Impact on safety and function Moderate aspiration risk   CHL IP TREATMENT RECOMMENDATION 05/01/2019 Treatment Recommendations Therapy as outlined in treatment plan below   Prognosis 05/01/2019 Prognosis for Safe Diet Advancement Good Barriers to Reach Goals -- Barriers/Prognosis Comment -- CHL IP DIET RECOMMENDATION 05/01/2019 SLP Diet Recommendations Dysphagia 1 (Puree) solids;Honey thick liquids Liquid Administration via Cup Medication Administration Crushed with puree Compensations Slow rate;Small sips/bites;Lingual sweep for clearance of pocketing;Clear throat intermittently Postural Changes Seated upright at 90 degrees   CHL IP OTHER RECOMMENDATIONS 05/01/2019 Recommended Consults -- Oral Care Recommendations Oral care BID Other Recommendations Order thickener from pharmacy   CHL IP FOLLOW UP RECOMMENDATIONS 05/01/2019 Follow up Recommendations Skilled Nursing facility   [facility] IP FREQUENCY AND DURATION  05/01/2019 Speech Therapy Frequency (ACUTE ONLY) min 2x/week Treatment Duration 2 weeks      CHL IP ORAL PHASE 05/01/2019 Oral Phase Impaired Oral - Pudding Teaspoon -- Oral - Pudding Cup -- Oral - Honey Teaspoon -- Oral - Honey Cup Lingual/palatal residue Oral - Nectar Teaspoon -- Oral - Nectar Cup Lingual/palatal residue Oral - Nectar Straw -- Oral - Thin Teaspoon -- Oral - Thin Cup Decreased bolus cohesion Oral - Thin Straw -- Oral - Puree Delayed oral transit;Lingual/palatal residue Oral - Mech Soft -- Oral - Regular -- Oral - Multi-Consistency -- Oral - Pill -- Oral Phase - Comment --  CHL IP PHARYNGEAL PHASE 05/01/2019 Pharyngeal Phase Impaired Pharyngeal- Pudding Teaspoon -- Pharyngeal -- Pharyngeal- Pudding Cup -- Pharyngeal -- Pharyngeal- Honey Teaspoon -- Pharyngeal -- Pharyngeal- Honey Cup WFL Pharyngeal -- Pharyngeal- Nectar Teaspoon -- Pharyngeal -- Pharyngeal- Nectar Cup Penetration/Aspiration during swallow Pharyngeal Material enters airway, passes BELOW cords without attempt by patient to eject out (silent aspiration);Material enters airway, CONTACTS cords and not ejected out Pharyngeal- Nectar Straw NT Pharyngeal -- Pharyngeal- Thin Teaspoon -- Pharyngeal -- Pharyngeal- Thin Cup Penetration/Aspiration during swallow Pharyngeal Material enters airway, remains ABOVE vocal cords and not ejected out Pharyngeal- Thin Straw -- Pharyngeal -- Pharyngeal- Puree Pharyngeal residue - valleculae;Penetration/Aspiration during swallow Pharyngeal -- Pharyngeal- Mechanical Soft -- Pharyngeal -- Pharyngeal- Regular -- Pharyngeal -- Pharyngeal- Multi-consistency -- Pharyngeal -- Pharyngeal- Pill -- Pharyngeal -- Pharyngeal Comment --  CHL IP CERVICAL ESOPHAGEAL PHASE 05/01/2019 Cervical Esophageal Phase WFL Pudding Teaspoon -- Pudding Cup -- Honey Teaspoon -- Honey Cup -- Nectar Teaspoon -- Nectar Cup -- Nectar Straw -- Thin Teaspoon -- Thin Cup -- Thin Straw -- Puree -- Mechanical Soft -- Regular -- Multi-consistency --  Pill -- Cervical Esophageal Comment -- Darliss Cheney  Willis 05/01/2019, 4:58 PM                Assessment and Plan: 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  Improving s/p cysto with fulguration.   Continue to try to decrease CBI.   If he goes 24 hours without the need for irrigation, it might be worthwhile to discontinue the foley which could be irritating the area I fulgurated in the trigone.    I am off the rest of the week.   Please contact our on call provider if there are issues with the catheter.       LOS: 49 days    Irine Seal 05/02/2019 [phone]

## 2019-05-02 NOTE — Progress Notes (Addendum)
[facility] Progress Note    Assessment/ Plan:   1. AKI(Cr was 0.7 prior to surgery)- due to postoperative CHF/cardiogenic - started on CRRT  03/29/19 - 2/24. Attempted IHD on 2/26 with minimal UF --> increased pressors and rapid afib.  Then CRRT on 3/1-- 3/8. Had been ontoleratingnet UF with  CRRT.   BUN and crt rising off CRRT which indicates he will remain HD dep. Tolerated IHD on 3/10 and on 3/12 and 3/15, no heparin.  Next HD planned 3/17.   2. Acute systolic CHF/cardiogenic shock- EF 25-30%; s/p Impella- now out. optimize volume as above. On midodrine  15 mg TID.  off pressors- still overloaded, trying to chip away at volume  3. CAD s/p [id number] 4/70/96 complicated by cardiogenic shock and ARF.Postop echo EF 25-30% 4. Acute hypoxic respiratory failure with pseudomonas pneumonia- s/p trach on 03/27/19.s/pabx per PCCM-> completed abx  5. Hematuria- bladder irrigationper urology recommendations-  radiation cystitis--> s/p a hemostatic dose of DDAVP today to see if that improves things. 6. ABLA- Hx Melen and hematuria. Transfuse as needed.Increase aranesp to 200 q week. 7. Protein malnutrition- moderate to severe- alb 2.0 8. Secondary hyperparathyroidism/bone mineral -  phos rising,s/p phoslo -.  pth 116 9. Rash: looks like drug reaction--> emollients/ hydroxyzine prn, ? Abx, amio 10. Disposition-  trach is out--> this was a huge step re: outpatient HD candidacy.  Will see how he does throughout week and if continues to improve will consider perm access (Ie AVF/AVG)--> only has L IJ TDC for now  Subjective:    Cysto yesterday showing radiation cystitis, clots evacuated.  CBI continues.  4 hr HD without heparin completed yesterday.  Feels tired this AM.     Objective:   BP (!) 102/40   Pulse 68   Temp 97.7 F (36.5 C) (Oral)   Resp 18   Ht 5\' 8"  (1.727 m)   Wt 85.7 kg   SpO2 97%   BMI 28.73 kg/m   Intake/Output Summary (Last 24 hours) at 05/02/2019  2836 Last data filed at 05/02/2019 0800 Gross per 24 hour  Intake 36200.97 ml  Output 35950 ml  Net 250.97 ml   Weight change: -2 kg  Physical Exam: Gen: older gentleman, NAD NECK: trach site dressed CVS: tachycardic Resp: clear anteriorly, bases diminished Abd: soft Ext: 1+ dependent and flank edema, improving, lacy rash/ erythematous, itchy over bilateral legs, some on stomach ACCESS: L IJ [facility]  Imaging: DG Chest Port 1 View  Result Date: 05/01/2019 CLINICAL DATA:  Respiratory failure, post CABG EXAM: PORTABLE CHEST 1 VIEW COMPARISON:  04/30/2019 FINDINGS: Left dialysis catheter and feeding tube remain in place, unchanged. Postoperative changes. Right PICC line remains in place with the tip at the cavoatrial junction. Bilateral layering effusions and lower lobe opacities, similar to prior study. Improving aeration in the left upper lobe. No pneumothorax. IMPRESSION: Continued layering effusions and bilateral lower lobe atelectasis or infiltrates. Improved aeration in the left upper lobe. No pneumothorax. Electronically Signed   By: Rolm Baptise M.D.   On: 05/01/2019 09:29   DG Swallowing Func-Speech Pathology  Result Date: 05/01/2019 Objective Swallowing Evaluation: Type of Study: MBS-Modified Barium Swallow Study  Patient Details Name: JAYMIR STRUBLE MRN: [id number] Date of Birth: [birth year] Today's Date: 05/01/2019 Time: SLP Start Time (ACUTE ONLY): 1315 -SLP Stop Time (ACUTE ONLY): 1335 SLP Time Calculation (min) (ACUTE ONLY): 20 min Past Medical History: Past Medical History: Diagnosis Date . Atrial fibrillation ([place])  . CAD (coronary artery disease)  a. s/p PCI in 1992 b. Coronary CT in 01/2018 showing extensive coronary calcification; FFR indeterminate --> medical management pursued at that time as patient not interested in repeat cath . Cancer [facility])   prostate . Dyspnea  . Hypertension  Past Surgical History: Past Surgical History: Procedure Laterality Date . CHEST TUBE INSERTION Left  04/23/2019  Procedure: INSERTION PLEURAL DRAINAGE CATHETER TO DRAIN LEFT PLEURAL EFFUSION;  Surgeon: Ivin Poot, MD;  Location: [place];  Service: Thoracic;  Laterality: Left; . CLIPPING OF ATRIAL APPENDAGE N/A 03/16/2019  Procedure: CLIPPING OF LEFT  ATRIAL APPENDAGE - USING ATRICLIP SIZE 40;  Surgeon: Ivin Poot, MD;  Location: [place];  Service: Open Heart Surgery;  Laterality: N/A; . COLONOSCOPY N/A 08/25/2017  Procedure: COLONOSCOPY;  Surgeon: Rogene Houston, MD;  [facility];  Service: Endoscopy;  Laterality: N/A; . CORONARY ARTERY BYPASS GRAFT N/A 03/16/2019  Procedure: CORONARY ARTERY BYPASS GRAFTING (CABG), ON PUMP, TIMES THREE, USING LEFT INTERNAL MAMMARY ARTERY, RIGHT GREATER SAPHENOUS VEIN HARVESTED ENDOSCOPICALLY;  Surgeon: Ivin Poot, MD;  Location: [place];  Service: Open Heart Surgery;  Laterality: N/A;  swan only . CYSTOSCOPY WITH FULGERATION N/A 04/30/2019  Procedure: CYSTOSCOPY WITH FULGERATION;  Surgeon: Irine Seal, MD;  Location: [place];  Service: Urology;  Laterality: N/A; . HERNIA REPAIR   . IR FLUORO GUIDE CV LINE LEFT  04/23/2019 . IR US GUIDE VASC ACCESS LEFT  04/23/2019 . MAZE N/A 03/16/2019  Procedure: MAZE;  Surgeon: Ivin Poot, MD;  Location: [place];  Service: Open Heart Surgery;  Laterality: N/A; . PLACEMENT OF IMPELLA LEFT VENTRICULAR ASSIST DEVICE Right 03/27/2019  Procedure: Placement Of Impella Left Ventricular Assist Device using ABIOMED Impella 5.5 with SmartAssist Device.;  Surgeon: Wonda Olds, MD;  Location: [facility];  Service: Thoracic;  Laterality: Right; . PROSTATE SURGERY   . REMOVAL OF IMPELLA LEFT VENTRICULAR ASSIST DEVICE N/A 04/10/2019  Procedure: REMOVAL OF IMPELLA LEFT VENTRICULAR ASSIST DEVICE WITH INSERTION OF RIGHT FEMORAL ARTERIAL LINE;  Surgeon: Ivin Poot, MD;  Location: [place];  Service: Open Heart Surgery;  Laterality: N/A; . RIGHT/LEFT HEART CATH AND CORONARY ANGIOGRAPHY N/A 03/15/2019  Procedure: RIGHT/LEFT HEART CATH AND CORONARY  ANGIOGRAPHY;  Surgeon: Burnell Blanks, MD;  Location: [place] [facility];  Service: Cardiovascular;  Laterality: N/A; . TEE WITHOUT CARDIOVERSION N/A 03/16/2019  Procedure: TRANSESOPHAGEAL ECHOCARDIOGRAM (TEE);  Surgeon: Prescott Gum, Collier Salina, MD;  Location: [place];  Service: Open Heart Surgery;  Laterality: N/A; . TRACHEOSTOMY TUBE PLACEMENT N/A 03/27/2019  Procedure: TRACHEOSTOMY placed using Shiley 8DCT Cuffed.;  Surgeon: Wonda Olds, MD;  Location: [facility];  Service: Thoracic;  Laterality: N/A; HPI: 86 y.o. male with PMH: h/o CAD s/p CABG and clipping of left atrial appendage, who presented with SOB on 2/1. He became hypoxic on 2/2 with pulmonary edema and needed to be intubated on 2/2 and extubated 2/5. Reintubated 2/8.  Pt underwent impella placement and tracheostomy on 2/9. Pt started on CRRT 2/11. Impella removal 2/23. Pt decannulated 3/13.  Subjective: pleasant and comfortable Assessment / Plan / Recommendation CHL IP CLINICAL IMPRESSIONS 05/01/2019 Clinical Impression Pt exhibits oropharyngeal dysphagia with laryngeal penetration with thin and nectar consistencies. Strength of swallow was adequate however timing of laryngeal deflection was late and nectar barium penetrated to his vocal cords without awareness and thin penetrated to the anterior laryngeal wall vestibule. In addition there may be reduced pressure as his stoma is partially patent evidenced by audible escapage of air and mucous  from stoma. There was mild tongue base, vallecular and pyriform sinus residue. Pt gagging and coughing after first trial barium as reported he has been having gagging/emesis previously. Recommend he initiate Dys 1 texture, honey thick liquids, pills whole in applesauce and continued ST for safety and efficiency.    SLP Visit Diagnosis Dysphagia, oropharyngeal phase (R13.12) Attention and concentration deficit following -- Frontal lobe and executive function deficit following -- Impact on safety and function Moderate  aspiration risk   CHL IP TREATMENT RECOMMENDATION 05/01/2019 Treatment Recommendations Therapy as outlined in treatment plan below   Prognosis 05/01/2019 Prognosis for Safe Diet Advancement Good Barriers to Reach Goals -- Barriers/Prognosis Comment -- [facility] IP DIET RECOMMENDATION 05/01/2019 SLP Diet Recommendations Dysphagia 1 (Puree) solids;Honey thick liquids Liquid Administration via Cup Medication Administration Crushed with puree Compensations Slow rate;Small sips/bites;Lingual sweep for clearance of pocketing;Clear throat intermittently Postural Changes Seated upright at 90 degrees   CHL IP OTHER RECOMMENDATIONS 05/01/2019 Recommended Consults -- Oral Care Recommendations Oral care BID Other Recommendations Order thickener from pharmacy   CH[facility] IP FOLLOW UP RECOMMENDATIONS 05/01/2019 Follow up Recommendations Skilled Nursing facility   [facility] IP FREQUENCY AND DURATION 05/01/2019 Speech Therapy Frequency (ACUTE ONLY) min 2x/week Treatment Duration 2 weeks      CHL IP ORAL PHASE 05/01/2019 Oral Phase Impaired Oral - Pudding Teaspoon -- Oral - Pudding Cup -- Oral - Honey Teaspoon -- Oral - Honey Cup Lingual/palatal residue Oral - Nectar Teaspoon -- Oral - Nectar Cup Lingual/palatal residue Oral - Nectar Straw -- Oral - Thin Teaspoon -- Oral - Thin Cup Decreased bolus cohesion Oral - Thin Straw -- Oral - Puree Delayed oral transit;Lingual/palatal residue Oral - Mech Soft -- Oral - Regular -- Oral - Multi-Consistency -- Oral - Pill -- Oral Phase - Comment --  CHL IP PHARYNGEAL PHASE 05/01/2019 Pharyngeal Phase Impaired Pharyngeal- Pudding Teaspoon -- Pharyngeal -- Pharyngeal- Pudding Cup -- Pharyngeal -- Pharyngeal- Honey Teaspoon -- Pharyngeal -- Pharyngeal- Honey Cup WFL Pharyngeal -- Pharyngeal- Nectar Teaspoon -- Pharyngeal -- Pharyngeal- Nectar Cup Penetration/Aspiration during swallow Pharyngeal Material enters airway, passes BELOW cords without attempt by patient to eject out (silent aspiration);Material enters airway,  CONTACTS cords and not ejected out Pharyngeal- Nectar Straw NT Pharyngeal -- Pharyngeal- Thin Teaspoon -- Pharyngeal -- Pharyngeal- Thin Cup Penetration/Aspiration during swallow Pharyngeal Material enters airway, remains ABOVE vocal cords and not ejected out Pharyngeal- Thin Straw -- Pharyngeal -- Pharyngeal- Puree Pharyngeal residue - valleculae;Penetration/Aspiration during swallow Pharyngeal -- Pharyngeal- Mechanical Soft -- Pharyngeal -- Pharyngeal- Regular -- Pharyngeal -- Pharyngeal- Multi-consistency -- Pharyngeal -- Pharyngeal- Pill -- Pharyngeal -- Pharyngeal Comment --  CHL IP CERVICAL ESOPHAGEAL PHASE 05/01/2019 Cervical Esophageal Phase WFL Pudding Teaspoon -- Pudding Cup -- Honey Teaspoon -- Honey Cup -- Nectar Teaspoon -- Nectar Cup -- Nectar Straw -- Thin Teaspoon -- Thin Cup -- Thin Straw -- Puree -- Mechanical Soft -- Regular -- Multi-consistency -- Pill -- Cervical Esophageal Comment -- [name] 05/01/2019, 4:58 PM               Labs: BMET Recent Labs  Lab 04/28/19 0516 04/28/19 1720 04/29/19 0449 04/30/19 0337 04/30/19 1441 05/01/19 0439 05/02/19 0425  NA 136 134* 135 132* 132* 137 137  K 4.6 5.2* 5.0 5.4* 5.4* 4.7 5.2*  CL 97* 96* 96* 91* 93* 98 98  CO2 28 28 28 27 24 28 25   GLUCOSE 129* 120* 108* 101* 141* 138* 94  BUN 50* 64* 76* 94* 98* 52* 79*  CREATININE 3.46*  4.04* 4.58* 5.45* 5.83* 3.73* 5.04*  CALCIUM 7.8* 7.9* 8.0* 7.9* 7.8* 7.8* 8.2*  PHOS 5.5* 6.5* 7.4* 7.9* 8.5* 4.8* 5.7*   CBC Recent Labs  Lab 04/29/19 0451 04/30/19 0337 04/30/19 1441 05/02/19 0657  WBC 11.2* 13.5* 17.6* 10.0  HGB 7.5* 8.9* 8.5* 6.5*  HCT 24.2* 28.4* 26.7* 21.4*  MCV 94.5 94.4 93.0 96.0  PLT 161 181 203 182    Medications:    . amiodarone  200 mg Oral Daily  . aspirin  81 mg Oral Daily  . atorvastatin  80 mg Oral q1800  . calcium acetate  667 mg Oral TID WC  . chlorhexidine  15 mL Mouth Rinse BID  . Chlorhexidine Gluconate Cloth  6 each Topical Q0600  .  darbepoetin (ARANESP) injection - DIALYSIS  200 mcg Intravenous Q Wed-HD  . feeding supplement (NEPRO CARB STEADY)  237 mL Oral BID BM  . feeding supplement (PRO-STAT SUGAR FREE 64)  30 mL Per Tube BID  . Gerhardt's butt cream   Topical BID  . hydrocortisone   Rectal BID  . insulin aspart  0-24 Units Subcutaneous Q4H  . insulin glargine  36 Units Subcutaneous Daily  . lipase/protease/amylase)  20,880 Units Per Tube Once   And  . sodium bicarbonate  650 mg Per Tube Once  . mouth rinse  15 mL Mouth Rinse q12n4p  . midodrine  15 mg Oral TID WC  . multivitamin  1 tablet Oral QHS  . nutrition supplement (JUVEN)  1 packet Per Tube BID BM  . pantoprazole (PROTONIX) IV  40 mg Intravenous Q24H  . simethicone  80 mg Oral BID PC  . sodium chloride flush  10-40 mL Intracatheter Q12H      Madelon Lips, MD 05/02/2019, 9:42 AM

## 2019-05-02 NOTE — Progress Notes (Signed)
Patient ID: Jesse Murphy, male   DOB: Nov 07, 1928, 86 y.o.   MRN: [id number]    Advanced Heart Failure Rounding Note  PCP-Cardiologist: Kate Sable, MD   Subjective:    Events - Impella removed 2/23. Intraoperative TEE showed EF ~40% - Had tunneled cath placed 3/9 and left PleurX tube.  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 Still with hematuria.  Feels weak but was able to walk into hall yesterday with PT assistance. Remains in NSR on po amio. Cor-trak clogged this am - pending replacement.  Hgb 6.5 this am    Objective:   Weight Range: 85.7 kg Body mass index is 28.73 kg/m.   Vital Signs:   Temp:  [97.1 F (36.2 C)-98.2 F (36.8 C)] 97.5 F (36.4 C) (03/17 1145) Pulse Rate:  [65-77] (P) 68 (03/17 1100) Resp:  [12-24] (P) 14 (03/17 1100) BP: (98-130)/(36-51) (P) 118/48 (03/17 1100) SpO2:  [94 %-100 %] 100 % (03/17 0934) Weight:  [85.4 kg-85.7 kg] 85.7 kg (03/17 0605) Last BM Date: 04/28/19(smear today per report)  Weight change: Filed Weights   05/01/19 0600 05/02/19 0600 05/02/19 0605  Weight: 86 kg 85.4 kg 85.7 kg    Intake/Output:   Intake/Output Summary (Last 24 hours) at 05/02/2019 1316 Last data filed at 05/02/2019 1200 Gross per 24 hour  Intake 39461.31 ml  Output 36050 ml  Net 3411.31 ml      Physical Exam   General:  Sitting in chair No resp difficulty HEENT: normal Neck: supple. No JVP 6  Carotids 2+ bilat; no bruits. No lymphadenopathy or thryomegaly appreciated. Trach site healing Cor: PMI nondisplaced. Regular rate & rhythm. No rubs, gallops or murmurs. Lungs: clear Abdomen: soft, nontender, nondistended. No hepatosplenomegaly. No bruits or masses. Good bowel sounds. Extremities: no cyanosis, clubbing,  Edema + faint maculopapular rash  Neuro: alert & orientedx3, cranial nerves grossly intact. moves all 4 extremities w/o difficulty. Affect  pleasant    Telemetry   NSR 70-80s Personally reviewed   Labs    CBC Recent Labs    04/30/19 1441 05/02/19 0657  WBC 17.6* 10.0  HGB 8.5* 6.5*  HCT 26.7* 21.4*  MCV 93.0 96.0  PLT 203 676   Basic Metabolic Panel Recent Labs    05/01/19 0439 05/02/19 0425  NA 137 137  K 4.7 5.2*  CL 98 98  CO2 28 25  GLUCOSE 138* 94  BUN 52* 79*  CREATININE 3.73* 5.04*  CALCIUM 7.8* 8.2*  MG 2.0 2.2  PHOS 4.8* 5.7*   Liver Function Tests Recent Labs    05/01/19 0439 05/02/19 0425  ALBUMIN 2.6* 2.5*   No results for input(s): LIPASE, AMYLASE in the last 72 hours. Cardiac Enzymes No results for input(s): CKTOTAL, CKMB, CKMBINDEX, TROPONINI in the last 72 hours.  BNP: BNP (last 3 results) Recent Labs    03/22/19 0401 03/26/19 0626 03/27/19 1255  BNP 340.4* 687.5* 800.4*    ProBNP (last 3 results) No results for input(s): PROBNP in the last 8760 hours.   D-Dimer No results for input(s): DDIMER in the last 72 hours. Hemoglobin A1C No results for input(s): HGBA1C in the last 72 hours. Fasting Lipid Panel No results for input(s): CHOL, HDL, LDLCALC, TRIG, CHOLHDL, LDLDIRECT in the last 72 hours. Thyroid Function Tests No results for input(s): TSH, T4TOTAL, T3FREE, THYROIDAB in the last 72 hours.  Invalid input(s): FREET3  Other results:  Imaging    DG Swallowing Func-Speech Pathology  Result Date: 05/01/2019 Objective Swallowing Evaluation: Type of Study: MBS-Modified Barium Swallow Study  Patient Details Name: Jesse Murphy MRN: [id number] Date of Birth: 12-Jan-1929 Today's Date: 05/01/2019 Time: SLP Start Time (ACUTE ONLY): 1315 -SLP Stop Time (ACUTE ONLY): 1335 SLP Time Calculation (min) (ACUTE ONLY): 20 min Past Medical History: Past Medical History: Diagnosis Date . Atrial fibrillation ([place])  . CAD (coronary artery disease)   a. s/p PCI in 1992 b. Coronary CT in 01/2018 showing extensive coronary calcification; FFR indeterminate --> medical management  pursued at that time as patient not interested in repeat cath . Cancer [facility])   prostate . Dyspnea  . Hypertension  Past Surgical History: Past Surgical History: Procedure Laterality Date . CHEST TUBE INSERTION Left 04/23/2019  Procedure: INSERTION PLEURAL DRAINAGE CATHETER TO DRAIN LEFT PLEURAL EFFUSION;  Surgeon: Ivin Poot, MD;  Location: [place];  Service: Thoracic;  Laterality: Left; . CLIPPING OF ATRIAL APPENDAGE N/A 03/16/2019  Procedure: CLIPPING OF LEFT  ATRIAL APPENDAGE - USING ATRICLIP SIZE 40;  Surgeon: Ivin Poot, MD;  Location: [place];  Service: Open Heart Surgery;  Laterality: N/A; . COLONOSCOPY N/A 08/25/2017  Procedure: COLONOSCOPY;  Surgeon: Rogene Houston, MD;  [facility];  Service: Endoscopy;  Laterality: N/A; . CORONARY ARTERY BYPASS GRAFT N/A 03/16/2019  Procedure: CORONARY ARTERY BYPASS GRAFTING (CABG), ON PUMP, TIMES THREE, USING LEFT INTERNAL MAMMARY ARTERY, RIGHT GREATER SAPHENOUS VEIN HARVESTED ENDOSCOPICALLY;  Surgeon: Ivin Poot, MD;  Location: [place];  Service: Open Heart Surgery;  Laterality: N/A;  swan only . CYSTOSCOPY WITH FULGERATION N/A 04/30/2019  Procedure: CYSTOSCOPY WITH FULGERATION;  Surgeon: Irine Seal, MD;  Location: [place];  Service: Urology;  Laterality: N/A; . HERNIA REPAIR   . IR FLUORO GUIDE CV LINE LEFT  04/23/2019 . IR US GUIDE VASC ACCESS LEFT  04/23/2019 . MAZE N/A 03/16/2019  Procedure: MAZE;  Surgeon: Ivin Poot, MD;  Location: [place];  Service: Open Heart Surgery;  Laterality: N/A; . PLACEMENT OF IMPELLA LEFT VENTRICULAR ASSIST DEVICE Right 03/27/2019  Procedure: Placement Of Impella Left Ventricular Assist Device using ABIOMED Impella 5.5 with SmartAssist Device.;  Surgeon: Wonda Olds, MD;  Location[facility];  Service: Thoracic;  Laterality: Right; . PROSTATE SURGERY   . REMOVAL OF IMPELLA LEFT VENTRICULAR ASSIST DEVICE N/A 04/10/2019  Procedure: REMOVAL OF IMPELLA LEFT VENTRICULAR ASSIST DEVICE WITH INSERTION OF RIGHT FEMORAL ARTERIAL  LINE;  Surgeon: Ivin Poot, MD;  Location: [place];  Service: Open Heart Surgery;  Laterality: N/A; . RIGHT/LEFT HEART CATH AND CORONARY ANGIOGRAPHY N/A 03/15/2019  Procedure: RIGHT/LEFT HEART CATH AND CORONARY ANGIOGRAPHY;  Surgeon: Burnell Blanks, MD;  Location: [place] [facility];  Service: Cardiovascular;  Laterality: N/A; . TEE WITHOUT CARDIOVERSION N/A 03/16/2019  Procedure: TRANSESOPHAGEAL ECHOCARDIOGRAM (TEE);  Surgeon: Prescott Gum, Collier Salina, MD;  Location: [place];  Service: Open Heart Surgery;  Laterality: N/A; . TRACHEOSTOMY TUBE PLACEMENT N/A 03/27/2019  Procedure: TRACHEOSTOMY placed using Shiley 8DCT Cuffed.;  Surgeon: Wonda Olds, MD;  Location: [facility];  Service: Thoracic;  Laterality: N/A; HPI: 86 y.o. male with PMH: h/o CAD s/p CABG and clipping of left atrial appendage, who presented with SOB on 2/1. He became hypoxic on 2/2 with pulmonary edema and needed to be intubated on 2/2 and extubated 2/5. Reintubated 2/8.  Pt underwent impella placement and tracheostomy on 2/9. Pt started on CRRT 2/11. Impella removal 2/23. Pt decannulated 3/13.  Subjective:  pleasant and comfortable Assessment / Plan / Recommendation CHL IP CLINICAL IMPRESSIONS 05/01/2019 Clinical Impression Pt exhibits oropharyngeal dysphagia with laryngeal penetration with thin and nectar consistencies. Strength of swallow was adequate however timing of laryngeal deflection was late and nectar barium penetrated to his vocal cords without awareness and thin penetrated to the anterior laryngeal wall vestibule. In addition there may be reduced pressure as his stoma is partially patent evidenced by audible escapage of air and mucous from stoma. There was mild tongue base, vallecular and pyriform sinus residue. Pt gagging and coughing after first trial barium as reported he has been having gagging/emesis previously. Recommend he initiate Dys 1 texture, honey thick liquids, pills whole in applesauce and continued ST for safety and  efficiency.    SLP Visit Diagnosis Dysphagia, oropharyngeal phase (R13.12) Attention and concentration deficit following -- Frontal lobe and executive function deficit following -- Impact on safety and function Moderate aspiration risk   CHL IP TREATMENT RECOMMENDATION 05/01/2019 Treatment Recommendations Therapy as outlined in treatment plan below   Prognosis 05/01/2019 Prognosis for Safe Diet Advancement Good Barriers to Reach Goals -- Barriers/Prognosis Comment -- CHL IP DIET RECOMMENDATION 05/01/2019 SLP Diet Recommendations Dysphagia 1 (Puree) solids;Honey thick liquids Liquid Administration via Cup Medication Administration Crushed with puree Compensations Slow rate;Small sips/bites;Lingual sweep for clearance of pocketing;Clear throat intermittently Postural Changes Seated upright at 90 degrees   CHL IP OTHER RECOMMENDATIONS 05/01/2019 Recommended Consults -- Oral Care Recommendations Oral care BID Other Recommendations Order thickener from pharmacy   CHL IP FOLLOW UP RECOMMENDATIONS 05/01/2019 Follow up Recommendations Skilled Nursing facility   [facility] IP FREQUENCY AND DURATION 05/01/2019 Speech Therapy Frequency (ACUTE ONLY) min 2x/week Treatment Duration 2 weeks      CHL IP ORAL PHASE 05/01/2019 Oral Phase Impaired Oral - Pudding Teaspoon -- Oral - Pudding Cup -- Oral - Honey Teaspoon -- Oral - Honey Cup Lingual/palatal residue Oral - Nectar Teaspoon -- Oral - Nectar Cup Lingual/palatal residue Oral - Nectar Straw -- Oral - Thin Teaspoon -- Oral - Thin Cup Decreased bolus cohesion Oral - Thin Straw -- Oral - Puree Delayed oral transit;Lingual/palatal residue Oral - Mech Soft -- Oral - Regular -- Oral - Multi-Consistency -- Oral - Pill -- Oral Phase - Comment --  CHL IP PHARYNGEAL PHASE 05/01/2019 Pharyngeal Phase Impaired Pharyngeal- Pudding Teaspoon -- Pharyngeal -- Pharyngeal- Pudding Cup -- Pharyngeal -- Pharyngeal- Honey Teaspoon -- Pharyngeal -- Pharyngeal- Honey Cup WFL Pharyngeal -- Pharyngeal- Nectar Teaspoon  -- Pharyngeal -- Pharyngeal- Nectar Cup Penetration/Aspiration during swallow Pharyngeal Material enters airway, passes BELOW cords without attempt by patient to eject out (silent aspiration);Material enters airway, CONTACTS cords and not ejected out Pharyngeal- Nectar Straw NT Pharyngeal -- Pharyngeal- Thin Teaspoon -- Pharyngeal -- Pharyngeal- Thin Cup Penetration/Aspiration during swallow Pharyngeal Material enters airway, remains ABOVE vocal cords and not ejected out Pharyngeal- Thin Straw -- Pharyngeal -- Pharyngeal- Puree Pharyngeal residue - valleculae;Penetration/Aspiration during swallow Pharyngeal -- Pharyngeal- Mechanical Soft -- Pharyngeal -- Pharyngeal- Regular -- Pharyngeal -- Pharyngeal- Multi-consistency -- Pharyngeal -- Pharyngeal- Pill -- Pharyngeal -- Pharyngeal Comment --  CHL IP CERVICAL ESOPHAGEAL PHASE 05/01/2019 Cervical Esophageal Phase WFL Pudding Teaspoon -- Pudding Cup -- Honey Teaspoon -- Honey Cup -- Nectar Teaspoon -- Nectar Cup -- Nectar Straw -- Thin Teaspoon -- Thin Cup -- Thin Straw -- Puree -- Mechanical Soft -- Regular -- Multi-consistency -- Pill -- Cervical Esophageal Comment -- [name] 05/01/2019, 4:58 PM  Medications:     Scheduled Medications: . amiodarone  200 mg Oral Daily  . aspirin  81 mg Oral Daily  . atorvastatin  80 mg Oral q1800  . calcium acetate  667 mg Oral TID WC  . chlorhexidine  15 mL Mouth Rinse BID  . Chlorhexidine Gluconate Cloth  6 each Topical Q0600  . darbepoetin (ARANESP) injection - DIALYSIS  200 mcg Intravenous Q Wed-HD  . feeding supplement (NEPRO CARB STEADY)  237 mL Oral BID BM  . feeding supplement (PRO-STAT SUGAR FREE 64)  30 mL Per Tube BID  . Gerhardt's butt cream   Topical BID  . hydrocortisone   Rectal BID  . insulin aspart  0-24 Units Subcutaneous Q4H  . insulin glargine  36 Units Subcutaneous Daily  . lipase/protease/amylase)  20,880 Units Per Tube Once   And  . sodium bicarbonate  650 mg Per  Tube Once  . mouth rinse  15 mL Mouth Rinse q12n4p  . midodrine  15 mg Oral TID WC  . multivitamin  1 tablet Oral QHS  . nutrition supplement (JUVEN)  1 packet Per Tube BID BM  . pantoprazole (PROTONIX) IV  40 mg Intravenous Q24H  . simethicone  80 mg Oral BID PC  . sodium chloride flush  10-40 mL Intracatheter Q12H    Infusions: . sodium chloride    . sodium chloride    . sodium chloride Stopped (05/01/19 1307)  . feeding supplement (JEVITY 1.5 CAL/FIBER) 1,000 mL (04/29/19 1533)  . magnesium sulfate bolus IVPB    . norepinephrine (LEVOPHED) Adult infusion Stopped (04/24/19 1800)    PRN Medications: sodium chloride, sodium chloride, oxyCODONE **AND** acetaminophen, alteplase, dextrose, diphenhydrAMINE, hyoscyamine, levalbuterol, lidocaine (PF), lidocaine-prilocaine, loperamide HCl, Melatonin, ondansetron (ZOFRAN) IV, pentafluoroprop-tetrafluoroeth, promethazine, Resource ThickenUp Clear, sodium chloride flush, sodium chloride flush    Assessment/Plan   1. Acute systolic HF -> Cardiogenic shock - post-op echo on 03/20/19 EF 25-30% (pre-op 30-35%. In 12/2017 EF normal) - Required Impella support post CABG. Impella removed 2/23. Post-op TEE EF 40% - volume status much improved  - tolerated iHD on 3/10 & 3/12 & 3/14 off pressors - For intermittent hemodialysis again today. Discussed with Dr. Hollie Salk at bedside  2. CAD s/p CABG this admit (LIMA->LAD, SVG->OM, SVG->RCA on 03/15/19) - No s/s ischemia - Continue ASA +statin  3. Acute hypoxic respiratory failure with Pseudomonas PNA - s/p trach on 03/27/19 - completed meropenem for pseudomonas PNA - trach aspirate now growing Pseudomonas again.   - now on cipro. We had planned to treat for 14 days (after talking with ID pharmD) but was stopped on 3/12 after 6 days as it was felt to be colonization -Trach decannulated 3/13.  Tolerating well. Continue to follow. Satuartions ok. [name] site looks good.   4. PAF - s/p MAZE and LAA  occlusion - Has been in/out AF. - Now maintaining NSR on po amio. - We will continue. No change  5. AKI  - due to shock/ATN - On CVVHD. Making minimal urine - Tunneled cath placed 3/9  - He tolerated intermittent hemodialysis on 3/10, 3/12, 3/14 - Plan for iHD today  = D/w Dr. Hollie Salk at bedside   6. Hematuria -Ongoing.  Remains on CBI. -Cystoscopy 3/15 with fulgeration - hgb 6.5. will transfuse  7. Debility, severe - continue PT/OT  8. F/E/N - Tube feeds per Cor-trak.  Clogged. Speech following for possible replacement   9. Left Pleural Effusion  - moderate to large. - s/p PleurX on  3/8.  - TCTS managing   10. LE rash - unclear trigger. Suspect drug rash. May be amio carrier - will give hydroxyzine. If getting worse will need to stop some meds - Discussed dosing with PharmD personally.    Glori Bickers, MD  1:16 PM

## 2019-05-02 NOTE — Plan of Care (Addendum)
Stable during shift. Alert and oriented.   BP stable with SBP mostly > 100, midodrine continues.  Sats stable, weaned to 2 L n/c.   CBI continues, mostly pink, occasional clot/bloody sediment with manual irrigation (sterile fashion via sample port on foley).   Cor-trak remains clogged off, unable to instil declogger, tube feeds off, tried to encourage po intake (pt with pureed diet/honey thick liquids) but c/o nausea with food. Was able to tolerate melatonin crushed in pudding with small amount of emesis, declined zofran. CBG stable.   BUN/Cr increasing 79/5.04, anticipate HD today.   Rash to legs, abdomen, tx with cool cloth, lotion and rubbing.   Problem: Education: Goal: Knowledge of General Education information will improve Description: Including pain rating scale, medication(s)/side effects and non-pharmacologic comfort measures Outcome: Progressing   Problem: Clinical Measurements: Goal: Will remain free from infection Outcome: Progressing Goal: Diagnostic test results will improve Outcome: Progressing   Problem: Activity: Goal: Risk for activity intolerance will decrease Outcome: Progressing   Problem: Coping: Goal: Level of anxiety will decrease Outcome: Progressing   Problem: Elimination: Goal: Will not experience complications related to urinary retention Outcome: Progressing   Problem: Pain Managment: Goal: General experience of comfort will improve Outcome: Progressing   Problem: Safety: Goal: Ability to remain free from injury will improve Outcome: Progressing

## 2019-05-03 ENCOUNTER — Inpatient Hospital Stay (HOSPITAL_COMMUNITY): Payer: Medicare Other

## 2019-05-03 LAB — TYPE AND SCREEN
ABO/RH(D): B POS
Antibody Screen: NEGATIVE
Unit division: 0
Unit division: 0
Unit division: 0

## 2019-05-03 LAB — BPAM RBC
Blood Product Expiration Date: 202104072359
Blood Product Expiration Date: 202104072359
Blood Product Expiration Date: 202104112359
ISSUE DATE / TIME: 202103142254
ISSUE DATE / TIME: 202103170924
ISSUE DATE / TIME: 202103171332
Unit Type and Rh: 7300
Unit Type and Rh: 7300
Unit Type and Rh: 7300

## 2019-05-03 LAB — RENAL FUNCTION PANEL
Albumin: 2.8 g/dL — ABNORMAL LOW (ref 3.5–5.0)
Anion gap: 11 (ref 5–15)
BUN: 50 mg/dL — ABNORMAL HIGH (ref 8–23)
CO2: 29 mmol/L (ref 22–32)
Calcium: 8.4 mg/dL — ABNORMAL LOW (ref 8.9–10.3)
Chloride: 98 mmol/L (ref 98–111)
Creatinine, Ser: 3.83 mg/dL — ABNORMAL HIGH (ref 0.61–1.24)
GFR calc Af Amer: 15 mL/min — ABNORMAL LOW (ref >60)
GFR calc non Af Amer: 13 mL/min — ABNORMAL LOW (ref >60)
Glucose, Bld: 125 mg/dL — ABNORMAL HIGH (ref 70–99)
Phosphorus: 4.7 mg/dL — ABNORMAL HIGH (ref 2.5–4.6)
Potassium: 4.8 mmol/L (ref 3.5–5.1)
Sodium: 138 mmol/L (ref 135–145)

## 2019-05-03 LAB — GLUCOSE, CAPILLARY
Glucose-Capillary: 113 mg/dL — ABNORMAL HIGH (ref 70–99)
Glucose-Capillary: 113 mg/dL — ABNORMAL HIGH (ref 70–99)
Glucose-Capillary: 119 mg/dL — ABNORMAL HIGH (ref 70–99)
Glucose-Capillary: 121 mg/dL — ABNORMAL HIGH (ref 70–99)
Glucose-Capillary: 137 mg/dL — ABNORMAL HIGH (ref 70–99)
Glucose-Capillary: 35 mg/dL — CL (ref 70–99)
Glucose-Capillary: 97 mg/dL (ref 70–99)

## 2019-05-03 LAB — MAGNESIUM: Magnesium: 2.2 mg/dL (ref 1.7–2.4)

## 2019-05-03 MED ORDER — HYDROXYZINE HCL 10 MG/5ML PO SYRP
10.0000 mg | ORAL_SOLUTION | Freq: Four times a day (QID) | ORAL | Status: DC | PRN
  Administered 2019-05-03 – 2019-05-04 (×3): 10 mg
  Filled 2019-05-03 (×4): qty 5

## 2019-05-03 MED ORDER — OXYCODONE HCL 5 MG/5ML PO SOLN
5.0000 mg | ORAL | Status: DC | PRN
  Administered 2019-05-06 – 2019-05-13 (×7): 5 mg
  Filled 2019-05-03 (×7): qty 5

## 2019-05-03 MED ORDER — SIMETHICONE 80 MG PO CHEW
80.0000 mg | CHEWABLE_TABLET | Freq: Two times a day (BID) | ORAL | Status: DC
  Administered 2019-05-03 – 2019-05-06 (×6): 80 mg
  Filled 2019-05-03 (×7): qty 1

## 2019-05-03 MED ORDER — RENA-VITE PO TABS
1.0000 | ORAL_TABLET | Freq: Every day | ORAL | Status: DC
  Administered 2019-05-03 – 2019-05-21 (×19): 1
  Filled 2019-05-03 (×19): qty 1

## 2019-05-03 MED ORDER — HYDROXYZINE HCL 10 MG/5ML PO SYRP
10.0000 mg | ORAL_SOLUTION | Freq: Four times a day (QID) | ORAL | Status: DC | PRN
  Filled 2019-05-03: qty 5

## 2019-05-03 MED ORDER — ACETAMINOPHEN 160 MG/5ML PO SOLN
325.0000 mg | ORAL | Status: DC | PRN
  Administered 2019-05-07 – 2019-05-12 (×4): 325 mg
  Filled 2019-05-03 (×4): qty 20.3

## 2019-05-03 MED ORDER — ASPIRIN 81 MG PO CHEW
81.0000 mg | CHEWABLE_TABLET | Freq: Every day | ORAL | Status: DC
  Administered 2019-05-04 – 2019-05-07 (×4): 81 mg
  Filled 2019-05-03 (×4): qty 1

## 2019-05-03 MED ORDER — MELATONIN 3 MG PO TABS
3.0000 mg | ORAL_TABLET | Freq: Every evening | ORAL | Status: DC | PRN
  Administered 2019-05-03: 3 mg
  Filled 2019-05-03 (×2): qty 1

## 2019-05-03 MED ORDER — MIDODRINE HCL 5 MG PO TABS
15.0000 mg | ORAL_TABLET | Freq: Three times a day (TID) | ORAL | Status: DC
  Administered 2019-05-03 – 2019-05-06 (×9): 15 mg
  Filled 2019-05-03 (×10): qty 3

## 2019-05-03 MED ORDER — ATORVASTATIN CALCIUM 80 MG PO TABS
80.0000 mg | ORAL_TABLET | Freq: Every day | ORAL | Status: DC
  Administered 2019-05-03 – 2019-05-05 (×3): 80 mg
  Filled 2019-05-03 (×4): qty 1

## 2019-05-03 MED ORDER — LOPERAMIDE HCL 1 MG/7.5ML PO SUSP
4.0000 mg | ORAL | Status: DC | PRN
  Filled 2019-05-03: qty 30

## 2019-05-03 MED ORDER — AMIODARONE HCL IN DEXTROSE 360-4.14 MG/200ML-% IV SOLN
30.0000 mg/h | INTRAVENOUS | Status: DC
  Administered 2019-05-03 – 2019-05-04 (×3): 30 mg/h via INTRAVENOUS
  Filled 2019-05-03 (×3): qty 200

## 2019-05-03 NOTE — Progress Notes (Signed)
Left Pleux drained 1000 mL removed  Kathleene Hazel RN

## 2019-05-03 NOTE — Progress Notes (Signed)
[facility] Progress Note    Assessment/ Plan:   1. AKI(Cr was 0.7 prior to surgery)- due to postoperative CHF/cardiogenic - started on CRRT  03/29/19 - 2/24. Attempted IHD on 2/26 with minimal UF --> increased pressors and rapid afib.  Then CRRT on 3/1-- 3/8. Had been ontoleratingnet UF with  CRRT.   BUN and crt rising off CRRT which indicates he will remain HD dep. Tolerating IHD.  Next planned 3/19, on MWF schedule.   2. Acute systolic CHF/cardiogenic shock- EF 25-30%; s/p Impella- now out. optimize volume as above. On midodrine  15 mg TID.  off pressors- still overloaded, trying to chip away at volume  3. CAD s/p [id number] 09/23/96 complicated by cardiogenic shock and ARF.Postop echo EF 25-30% 4. Acute hypoxic respiratory failure with pseudomonas pneumonia- s/p trach on 03/27/19.s/pabx per PCCM-> completed abx  5. 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 6. ABLA- Hx Melen and hematuria. Transfuse as needed.Increase aranesp to 200 q week. 7. Protein malnutrition- moderate to severe- alb 2.0 8. Secondary hyperparathyroidism/bone mineral -  phos rising,s/p phoslo -.  pth 116 9. Rash: looks like drug reaction--> emollients/ hydroxyzine prn, ? Abx, amio 10. Disposition-  trach is out--> this was a huge step re: outpatient HD candidacy.  Will see how he does throughout week and if continues to improve will consider perm access (Ie AVF/AVG)--> only has L IJ TDC for now  Subjective:    HD yesterday, 2.5L off--> excellent. Was tired again after but seemed to do pretty well overall.     Objective:   BP (!) 109/42 (BP Location: Left Arm)   Pulse 68   Temp 98.7 F (37.1 C) (Oral)   Resp 13   Ht 5\' 8"  (1.727 m)   Wt 85.5 kg   SpO2 98%   BMI 28.66 kg/m   Intake/Output Summary (Last 24 hours) at 05/03/2019 0910 Last data filed at 05/03/2019 0800 Gross per 24 hour  Intake 13475 ml   Output 12800 ml  Net 675 ml   Weight change: 3.6 kg  Physical Exam: Gen: older gentleman, NAD NECK: trach site dressed CVS: RRR Resp: clear anteriorly, bases diminished Abd: soft Ext: 1+ dependent and flank edema, improving, lacy rash/ erythematous, itchy over bilateral legs, some on stomach [facility]  Imaging: DG Chest Port 1 View  Result Date: 05/03/2019 CLINICAL DATA:  CABG EXAM: PORTABLE CHEST 1 VIEW COMPARISON:  05/01/2019 FINDINGS: Support devices are stable. Changes of CABG. Increasing layering effusions and bilateral airspace disease, likely edema. Bibasilar atelectasis. IMPRESSION: Worsening layering effusions, edema and bibasilar atelectasis. Electronically Signed   By: Rolm Baptise M.D.   On: 05/03/2019 08:40   DG Swallowing Func-Speech Pathology  Result Date: 05/01/2019 Objective Swallowing Evaluation: Type of Study: MBS-Modified Barium Swallow Study  Patient Details Name: Jesse Murphy MRN: [id number] Date of Birth: 04/02/1928 Today's Date: 05/01/2019 Time: SLP Start Time (ACUTE ONLY): 1315 -SLP Stop Time (ACUTE ONLY): 1335 SLP Time Calculation (min) (ACUTE ONLY): 20 min Past Medical History: Past Medical History: Diagnosis Date . Atrial fibrillation ([place])  . CAD (coronary artery disease)   a. s/p PCI in 1992 b. Coronary CT in 01/2018 showing extensive coronary calcification; FFR indeterminate --> medical management pursued at that time as patient not interested in repeat cath . Cancer [facility])   prostate . Dyspnea  . Hypertension  Past Surgical History: Past Surgical History: Procedure Laterality Date . CHEST TUBE INSERTION Left  04/23/2019  Procedure: INSERTION PLEURAL DRAINAGE CATHETER TO DRAIN LEFT PLEURAL EFFUSION;  Surgeon: Ivin Poot, MD;  Location: [place];  Service: Thoracic;  Laterality: Left; . CLIPPING OF ATRIAL APPENDAGE N/A 03/16/2019  Procedure: CLIPPING OF LEFT  ATRIAL APPENDAGE - USING ATRICLIP SIZE 40;  Surgeon: Ivin Poot, MD;  Location: [place];  Service:  Open Heart Surgery;  Laterality: N/A; . COLONOSCOPY N/A 08/25/2017  Procedure: COLONOSCOPY;  Surgeon: Rogene Houston, MD;  [facility];  Service: Endoscopy;  Laterality: N/A; . CORONARY ARTERY BYPASS GRAFT N/A 03/16/2019  Procedure: CORONARY ARTERY BYPASS GRAFTING (CABG), ON PUMP, TIMES THREE, USING LEFT INTERNAL MAMMARY ARTERY, RIGHT GREATER SAPHENOUS VEIN HARVESTED ENDOSCOPICALLY;  Surgeon: Ivin Poot, MD;  Location: [place];  Service: Open Heart Surgery;  Laterality: N/A;  swan only . CYSTOSCOPY WITH FULGERATION N/A 04/30/2019  Procedure: CYSTOSCOPY WITH FULGERATION;  Surgeon: Irine Seal, MD;  Location: [place];  Service: Urology;  Laterality: N/A; . HERNIA REPAIR   . IR FLUORO GUIDE CV LINE LEFT  04/23/2019 . IR US GUIDE VASC ACCESS LEFT  04/23/2019 . MAZE N/A 03/16/2019  Procedure: MAZE;  Surgeon: Ivin Poot, MD;  Location: [place];  Service: Open Heart Surgery;  Laterality: N/A; . PLACEMENT OF IMPELLA LEFT VENTRICULAR ASSIST DEVICE Right 03/27/2019  Procedure: Placement Of Impella Left Ventricular Assist Device using ABIOMED Impella 5.5 with SmartAssist Device.;  Surgeon: Wonda Olds, MD;  Location: [facility];  Service: Thoracic;  Laterality: Right; . PROSTATE SURGERY   . REMOVAL OF IMPELLA LEFT VENTRICULAR ASSIST DEVICE N/A 04/10/2019  Procedure: REMOVAL OF IMPELLA LEFT VENTRICULAR ASSIST DEVICE WITH INSERTION OF RIGHT FEMORAL ARTERIAL LINE;  Surgeon: Ivin Poot, MD;  Location: [place];  Service: Open Heart Surgery;  Laterality: N/A; . RIGHT/LEFT HEART CATH AND CORONARY ANGIOGRAPHY N/A 03/15/2019  Procedure: RIGHT/LEFT HEART CATH AND CORONARY ANGIOGRAPHY;  Surgeon: Burnell Blanks, MD;  Location: [place] CV LAB;  Service: Cardiovascular;  Laterality: N/A; . TEE WITHOUT CARDIOVERSION N/A 03/16/2019  Procedure: TRANSESOPHAGEAL ECHOCARDIOGRAM (TEE);  Surgeon: Prescott Gum, Collier Salina, MD;  Location: [place];  Service: Open Heart Surgery;  Laterality: N/A; . TRACHEOSTOMY TUBE PLACEMENT N/A  03/27/2019  Procedure: TRACHEOSTOMY placed using Shiley 8DCT Cuffed.;  Surgeon: Wonda Olds, MD;  Location: [facility];  Service: Thoracic;  Laterality: N/A; HPI: 86 y.o. male with PMH: h/o CAD s/p CABG and clipping of left atrial appendage, who presented with SOB on 2/1. He became hypoxic on 2/2 with pulmonary edema and needed to be intubated on 2/2 and extubated 2/5. Reintubated 2/8.  Pt underwent impella placement and tracheostomy on 2/9. Pt started on CRRT 2/11. Impella removal 2/23. Pt decannulated 3/13.  Subjective: pleasant and comfortable Assessment / Plan / Recommendation CHL IP CLINICAL IMPRESSIONS 05/01/2019 Clinical Impression Pt exhibits oropharyngeal dysphagia with laryngeal penetration with thin and nectar consistencies. Strength of swallow was adequate however timing of laryngeal deflection was late and nectar barium penetrated to his vocal cords without awareness and thin penetrated to the anterior laryngeal wall vestibule. In addition there may be reduced pressure as his stoma is partially patent evidenced by audible escapage of air and mucous from stoma. There was mild tongue base, vallecular and pyriform sinus residue. Pt gagging and coughing after first trial barium as reported he has been having gagging/emesis previously. Recommend he initiate Dys 1 texture, honey thick liquids, pills whole in applesauce and continued ST for safety and efficiency.    SLP Visit Diagnosis Dysphagia, oropharyngeal  phase (R13.12) Attention and concentration deficit following -- Frontal lobe and executive function deficit following -- Impact on safety and function Moderate aspiration risk   CHL IP TREATMENT RECOMMENDATION 05/01/2019 Treatment Recommendations Therapy as outlined in treatment plan below   Prognosis 05/01/2019 Prognosis for Safe Diet Advancement Good Barriers to Reach Goals -- Barriers/Prognosis Comment -- CHL IP DIET RECOMMENDATION 05/01/2019 SLP Diet Recommendations Dysphagia 1 (Puree) solids;Honey thick  liquids Liquid Administration via Cup Medication Administration Crushed with puree Compensations Slow rate;Small sips/bites;Lingual sweep for clearance of pocketing;Clear throat intermittently Postural Changes Seated upright at 90 degrees   CHL IP OTHER RECOMMENDATIONS 05/01/2019 Recommended Consults -- Oral Care Recommendations Oral care BID Other Recommendations Order thickener from pharmacy   CHL IP FOLLOW UP RECOMMENDATIONS 05/01/2019 Follow up Recommendations Skilled Nursing facility   [facility] IP FREQUENCY AND DURATION 05/01/2019 Speech Therapy Frequency (ACUTE ONLY) min 2x/week Treatment Duration 2 weeks      CHL IP ORAL PHASE 05/01/2019 Oral Phase Impaired Oral - Pudding Teaspoon -- Oral - Pudding Cup -- Oral - Honey Teaspoon -- Oral - Honey Cup Lingual/palatal residue Oral - Nectar Teaspoon -- Oral - Nectar Cup Lingual/palatal residue Oral - Nectar Straw -- Oral - Thin Teaspoon -- Oral - Thin Cup Decreased bolus cohesion Oral - Thin Straw -- Oral - Puree Delayed oral transit;Lingual/palatal residue Oral - Mech Soft -- Oral - Regular -- Oral - Multi-Consistency -- Oral - Pill -- Oral Phase - Comment --  CHL IP PHARYNGEAL PHASE 05/01/2019 Pharyngeal Phase Impaired Pharyngeal- Pudding Teaspoon -- Pharyngeal -- Pharyngeal- Pudding Cup -- Pharyngeal -- Pharyngeal- Honey Teaspoon -- Pharyngeal -- Pharyngeal- Honey Cup WFL Pharyngeal -- Pharyngeal- Nectar Teaspoon -- Pharyngeal -- Pharyngeal- Nectar Cup Penetration/Aspiration during swallow Pharyngeal Material enters airway, passes BELOW cords without attempt by patient to eject out (silent aspiration);Material enters airway, CONTACTS cords and not ejected out Pharyngeal- Nectar Straw NT Pharyngeal -- Pharyngeal- Thin Teaspoon -- Pharyngeal -- Pharyngeal- Thin Cup Penetration/Aspiration during swallow Pharyngeal Material enters airway, remains ABOVE vocal cords and not ejected out Pharyngeal- Thin Straw -- Pharyngeal -- Pharyngeal- Puree Pharyngeal residue -  valleculae;Penetration/Aspiration during swallow Pharyngeal -- Pharyngeal- Mechanical Soft -- Pharyngeal -- Pharyngeal- Regular -- Pharyngeal -- Pharyngeal- Multi-consistency -- Pharyngeal -- Pharyngeal- Pill -- Pharyngeal -- Pharyngeal Comment --  CHL IP CERVICAL ESOPHAGEAL PHASE 05/01/2019 Cervical Esophageal Phase WFL Pudding Teaspoon -- Pudding Cup -- Honey Teaspoon -- Honey Cup -- Nectar Teaspoon -- Nectar Cup -- Nectar Straw -- Thin Teaspoon -- Thin Cup -- Thin Straw -- Puree -- Mechanical Soft -- Regular -- Multi-consistency -- Pill -- Cervical Esophageal Comment -- [name] 05/01/2019, 4:58 PM               Labs: BMET Recent Labs  Lab 04/28/19 1720 04/29/19 0449 04/30/19 0337 04/30/19 1441 05/01/19 0439 05/02/19 0425 05/03/19 0441  NA 134* 135 132* 132* 137 137 138  K 5.2* 5.0 5.4* 5.4* 4.7 5.2* 4.8  CL 96* 96* 91* 93* 98 98 98  CO2 28 28 27 24 28 25 29   GLUCOSE 120* 108* 101* 141* 138* 94 125*  BUN 64* 76* 94* 98* 52* 79* 50*  CREATININE 4.04* 4.58* 5.45* 5.83* 3.73* 5.04* 3.83*  CALCIUM 7.9* 8.0* 7.9* 7.8* 7.8* 8.2* 8.4*  PHOS 6.5* 7.4* 7.9* 8.5* 4.8* 5.7* 4.7*   CBC Recent Labs  Lab 04/30/19 0337 04/30/19 1441 05/02/19 0657 05/02/19 2030  WBC 13.5* 17.6* 10.0 9.2  HGB 8.9* 8.5* 6.5* 8.5*  HCT 28.4* 26.7* 21.4*  26.8*  MCV 94.4 93.0 96.0 92.7  PLT 181 203 182 142*    Medications:    . amiodarone  200 mg Oral Daily  . aspirin  81 mg Oral Daily  . atorvastatin  80 mg Oral q1800  . calcium acetate  667 mg Oral TID WC  . chlorhexidine  15 mL Mouth Rinse BID  . darbepoetin (ARANESP) injection - DIALYSIS  200 mcg Intravenous Q Wed-HD  . feeding supplement (NEPRO CARB STEADY)  237 mL Oral BID BM  . feeding supplement (PRO-STAT SUGAR FREE 64)  30 mL Per Tube BID  . Gerhardt's butt cream   Topical BID  . hydrocortisone   Rectal BID  . insulin aspart  0-24 Units Subcutaneous Q4H  . insulin glargine  36 Units Subcutaneous Daily  . lipase/protease/amylase)   20,880 Units Per Tube Once   And  . sodium bicarbonate  650 mg Per Tube Once  . mouth rinse  15 mL Mouth Rinse q12n4p  . midodrine  15 mg Oral TID WC  . multivitamin  1 tablet Oral QHS  . nutrition supplement (JUVEN)  1 packet Per Tube BID BM  . pantoprazole (PROTONIX) IV  40 mg Intravenous Q24H  . simethicone  80 mg Oral BID PC  . sodium chloride flush  10-40 mL Intracatheter Q12H      Madelon Lips, MD 05/03/2019, 9:10 AM

## 2019-05-03 NOTE — Progress Notes (Signed)
Patient maintained NSR all day and throughout the night until 2258 when pt had a 5 second pause/asystole in rhythm (paper strip on clipboard at nurse's desk). Patient converted back to NSR and to HR in the 60s. Other vital signs stable.   Patient was sleeping when the pause happened and asymptomatic when assessed.   Charge RN aware. Will continue to monitor and if more occurrences happen, will make MD aware.

## 2019-05-03 NOTE — Progress Notes (Addendum)
[facility] DAILY ICU PROGRESS NOTE                   [address]            [place] [address]          [phone]   3 Days Post-Op Procedure(s) (LRB): CYSTOSCOPY WITH FULGERATION (N/A)  Total Length of Stay:  LOS: 50 days   Subjective: Currently on commode  Objective: Vital signs in last 24 hours: Temp:  [97.6 F (36.4 C)-98.7 F (37.1 C)] 97.7 F (36.5 C) (03/18 1100) Pulse Rate:  [60-78] 71 (03/18 1100) Cardiac Rhythm: Normal sinus rhythm (03/18 0800) Resp:  [11-26] 19 (03/18 1100) BP: (87-131)/(38-52) 109/41 (03/18 1100) SpO2:  [88 %-100 %] 95 % (03/18 1100) Weight:  [85.5 kg-89 kg] 85.5 kg (03/17 1859)  Filed Weights   05/02/19 0605 05/02/19 1430 05/02/19 1859  Weight: 85.7 kg 89 kg 85.5 kg    Weight change: 3.6 kg   Hemodynamic parameters for last 24 hours: CVP:  [2 mmHg-8 mmHg] 6 mmHg  Intake/Output from previous day: 03/17 0701 - 03/18 0700 In: 13495 [P.O.:50; I.V.:50; Blood:605; NG/GT:770] Out: 14800 [Urine:12300]  Intake/Output this shift: Total I/O In: 160 [NG/GT:160] Out: 1100 [Urine:1100]  Current Meds: Scheduled Meds: . [START ON 05/04/2019] aspirin  81 mg Per Tube Daily  . atorvastatin  80 mg Per Tube q1800  . calcium acetate  667 mg Oral TID WC  . chlorhexidine  15 mL Mouth Rinse BID  . darbepoetin (ARANESP) injection - DIALYSIS  200 mcg Intravenous Q Wed-HD  . feeding supplement (NEPRO CARB STEADY)  237 mL Oral BID BM  . feeding supplement (PRO-STAT SUGAR FREE 64)  30 mL Per Tube BID  . Gerhardt's butt cream   Topical BID  . hydrocortisone   Rectal BID  . insulin aspart  0-24 Units Subcutaneous Q4H  . insulin glargine  36 Units Subcutaneous Daily  . lipase/protease/amylase)  20,880 Units Per Tube Once   And  . sodium bicarbonate  650 mg Per Tube Once  . mouth rinse  15 mL Mouth Rinse q12n4p  . midodrine  15 mg Per Tube TID WC  . multivitamin  1 tablet Per Tube QHS  . nutrition supplement (JUVEN)  1 packet Per Tube BID BM   . pantoprazole (PROTONIX) IV  40 mg Intravenous Q24H  . simethicone  80 mg Per Tube BID PC  . sodium chloride flush  10-40 mL Intracatheter Q12H   Continuous Infusions: . sodium chloride    . sodium chloride    . sodium chloride Stopped (05/01/19 1307)  . amiodarone    . feeding supplement (JEVITY 1.5 CAL/FIBER) 1,000 mL (05/03/19 0944)  . magnesium sulfate bolus IVPB    . norepinephrine (LEVOPHED) Adult infusion Stopped (04/24/19 1800)   PRN Meds:.sodium chloride, sodium chloride, oxyCODONE **AND** acetaminophen, alteplase, dextrose, hydrOXYzine, hyoscyamine, levalbuterol, lidocaine (PF), lidocaine-prilocaine, loperamide HCl, Melatonin, ondansetron (ZOFRAN) IV, pentafluoroprop-tetrafluoroeth, promethazine, Resource ThickenUp Clear, sodium chloride flush, sodium chloride flush  General appearance: alert, cooperative, no distress and slowly getting stronger  Lab Results: CBC: Recent Labs    05/02/19 0657 05/02/19 2030  WBC 10.0 9.2  HGB 6.5* 8.5*  HCT 21.4* 26.8*  PLT 182 142*   BMET:  Recent Labs    05/02/19 0425 05/03/19 0441  NA 137 138  K 5.2* 4.8  CL 98 98  CO2 25 29  GLUCOSE 94 125*  BUN 79* 50*  CREATININE 5.04* 3.83*  CALCIUM  8.2* 8.4*    CMET: Lab Results  Component Value Date   WBC 9.2 05/02/2019   HGB 8.5 (L) 05/02/2019   HCT 26.8 (L) 05/02/2019   PLT 142 (L) 05/02/2019   GLUCOSE 125 (H) 05/03/2019   CHOL 117 03/16/2019   TRIG 52 03/16/2019   HDL 33 (L) 03/16/2019   LDLCALC 74 03/16/2019   ALT 11 04/23/2019   AST 23 04/23/2019   NA 138 05/03/2019   K 4.8 05/03/2019   CL 98 05/03/2019   CREATININE 3.83 (H) 05/03/2019   BUN 50 (H) 05/03/2019   CO2 29 05/03/2019   TSH 3.683 03/13/2019   INR 1.7 (H) 03/27/2019   HGBA1C 6.1 (H) 03/13/2019      PT/INR: No results for input(s): LABPROT, INR in the last 72 hours. Radiology: [facility] Chest Port 1 View  Result Date: 05/03/2019 CLINICAL DATA:  CABG EXAM: PORTABLE CHEST 1 VIEW COMPARISON:  05/01/2019  FINDINGS: Support devices are stable. Changes of CABG. Increasing layering effusions and bilateral airspace disease, likely edema. Bibasilar atelectasis. IMPRESSION: Worsening layering effusions, edema and bibasilar atelectasis. Electronically Signed   By: Rolm Baptise M.D.   On: 05/03/2019 08:40     Assessment/Plan:  1 conts to progress 2 tol HD pretty well, may be candidate for perm access soon 3 maintaining sinus rhythm and stable hemodynamics, therapy being guided per AHF team 4 conts CBI for hematuria 5 CXR may be slightly worse pulm edema and effusions, has left pleux and HD obtaining good volumes 6 hopefully may be able to go to CIR relatively soon    John Giovanni PA-C Pager [phone] 05/03/2019 12:35 PM   Walked again in the hallway with assistance  Stopped continued bladder irrigation the patient developed discomfort and urine output became deeper red so irrigation resumed  Pacing wires removed without incident as patient has maintained sinus rhythm  Total body rash remains a discomfort and amiodarone has been changed to IV to avoid the oral conjugated ingredients  Patient with slightly wet cough-left Pleurx catheter will be drained today.  Anticipate transfer to [facility] when he can tolerate dialysis in the HD unit  patient examined and medical record reviewed,agree with above note. Tharon Aquas Trigt III 05/03/2019

## 2019-05-03 NOTE — Progress Notes (Addendum)
Patient ID: Jesse Murphy, male   DOB: 1928/08/31, 86 y.o.   MRN: [id number] f    Advanced Heart Failure Rounding Note  PCP-Cardiologist: Kate Sable, MD   Subjective:    Events - Impella removed 2/23. Intraoperative TEE showed EF ~40% - Had tunneled cath placed 3/9 and left PleurX tube.  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 Itching from diffuse rash. Says he has had a rash like this before from his detergent.     Objective:   Weight Range: 85.5 kg Body mass index is 28.66 kg/m.   Vital Signs:   Temp:  [97.1 F (36.2 C)-98.7 F (37.1 C)] 98.7 F (37.1 C) (03/18 0400) Pulse Rate:  [60-76] 71 (03/18 0700) Resp:  [11-26] 15 (03/18 0700) BP: (87-131)/(38-52) 109/45 (03/18 0700) SpO2:  [88 %-100 %] 96 % (03/18 0700) Weight:  [85.5 kg-89 kg] 85.5 kg (03/17 1859) Last BM Date: 04/28/19  Weight change: Filed Weights   05/02/19 0605 05/02/19 1430 05/02/19 1859  Weight: 85.7 kg 89 kg 85.5 kg    Intake/Output:   Intake/Output Summary (Last 24 hours) at 05/03/2019 0846 Last data filed at 05/03/2019 0700 Gross per 24 hour  Intake 13475 ml  Output 12800 ml  Net 675 ml      Physical Exam  CVP 5-6.  General:  In bed.  No resp difficulty HEENT: normal except cortak  Anicteric  Neck: dressing from previous trach, supple. no JVD. Carotids 2+ bilat; no bruits. No lymphadenopathy or thryomegaly appreciated. Cor: PMI nondisplaced. Regular rate & rhythm. No rubs, gallops or murmurs. L subclavian HD catheter Lungs: clear on 4 liters. No wheeze  Abdomen: soft, nontender, nondistended. No hepatosplenomegaly. No bruits or masses. Good bowel sounds. Extremities: no cyanosis, clubbing, rash, edema Neuro: alert & orientedx3, cranial nerves grossly intact. moves all 4 extremities w/o difficulty.  Affect pleasant GU: Foley urine with orange with CBI Skin: Diffuse macular pink rash   Telemetry   NSR 70-80s personally reviewed.    Labs    CBC Recent Labs    05/02/19 0657 05/02/19 2030  WBC 10.0 9.2  HGB 6.5* 8.5*  HCT 21.4* 26.8*  MCV 96.0 92.7  PLT 182 638*   Basic Metabolic Panel Recent Labs    05/02/19 0425 05/03/19 0441  NA 137 138  K 5.2* 4.8  CL 98 98  CO2 25 29  GLUCOSE 94 125*  BUN 79* 50*  CREATININE 5.04* 3.83*  CALCIUM 8.2* 8.4*  MG 2.2 2.2  PHOS 5.7* 4.7*   Liver Function Tests Recent Labs    05/02/19 0425 05/03/19 0441  ALBUMIN 2.5* 2.8*   No results for input(s): LIPASE, AMYLASE in the last 72 hours. Cardiac Enzymes No results for input(s): CKTOTAL, CKMB, CKMBINDEX, TROPONINI in the last 72 hours.  BNP: BNP (last 3 results) Recent Labs    03/22/19 0401 03/26/19 0626 03/27/19 1255  BNP 340.4* 687.5* 800.4*    ProBNP (last 3 results) No results for input(s): PROBNP in the last 8760 hours.   D-Dimer No results for input(s): DDIMER in the last 72 hours. Hemoglobin A1C No results for input(s): HGBA1C in the last 72 hours. Fasting Lipid Panel No results for input(s): CHOL, HDL, LDLCALC, TRIG, CHOLHDL, LDLDIRECT in the last 72 hours. Thyroid Function Tests No results  for input(s): TSH, T4TOTAL, T3FREE, THYROIDAB in the last 72 hours.  Invalid input(s): FREET3  Other results:   Imaging    DG Chest Port 1 View  Result Date: 05/03/2019 CLINICAL DATA:  CABG EXAM: PORTABLE CHEST 1 VIEW COMPARISON:  05/01/2019 FINDINGS: Support devices are stable. Changes of CABG. Increasing layering effusions and bilateral airspace disease, likely edema. Bibasilar atelectasis. IMPRESSION: Worsening layering effusions, edema and bibasilar atelectasis. Electronically Signed   By: Rolm Baptise M.D.   On: 05/03/2019 08:40     Medications:     Scheduled Medications: . amiodarone  200 mg Oral Daily  . aspirin  81 mg Oral Daily  .  atorvastatin  80 mg Oral q1800  . calcium acetate  667 mg Oral TID WC  . chlorhexidine  15 mL Mouth Rinse BID  . Chlorhexidine Gluconate Cloth  6 each Topical Q0600  . darbepoetin (ARANESP) injection - DIALYSIS  200 mcg Intravenous Q Wed-HD  . feeding supplement (NEPRO CARB STEADY)  237 mL Oral BID BM  . feeding supplement (PRO-STAT SUGAR FREE 64)  30 mL Per Tube BID  . Gerhardt's butt cream   Topical BID  . hydrocortisone   Rectal BID  . insulin aspart  0-24 Units Subcutaneous Q4H  . insulin glargine  36 Units Subcutaneous Daily  . lipase/protease/amylase)  20,880 Units Per Tube Once   And  . sodium bicarbonate  650 mg Per Tube Once  . mouth rinse  15 mL Mouth Rinse q12n4p  . midodrine  15 mg Oral TID WC  . multivitamin  1 tablet Oral QHS  . nutrition supplement (JUVEN)  1 packet Per Tube BID BM  . pantoprazole (PROTONIX) IV  40 mg Intravenous Q24H  . simethicone  80 mg Oral BID PC  . sodium chloride flush  10-40 mL Intracatheter Q12H    Infusions: . sodium chloride    . sodium chloride    . sodium chloride Stopped (05/01/19 1307)  . feeding supplement (JEVITY 1.5 CAL/FIBER) 1,000 mL (04/29/19 1533)  . magnesium sulfate bolus IVPB    . norepinephrine (LEVOPHED) Adult infusion Stopped (04/24/19 1800)    PRN Medications: sodium chloride, sodium chloride, oxyCODONE **AND** acetaminophen, alteplase, dextrose, diphenhydrAMINE, hyoscyamine, levalbuterol, lidocaine (PF), lidocaine-prilocaine, loperamide HCl, Melatonin, ondansetron (ZOFRAN) IV, pentafluoroprop-tetrafluoroeth, promethazine, Resource ThickenUp Clear, sodium chloride flush, sodium chloride flush    Assessment/Plan   1. Acute systolic HF -> Cardiogenic shock - post-op echo on 03/20/19 EF 25-30% (pre-op 30-35%. In 12/2017 EF normal) - Required Impella support post CABG. Impella removed 2/23. Post-op TEE EF 40% - volume status stable. Managed with iHD - tolerating iHD  2. CAD s/p CABG this admit (LIMA->LAD, SVG->OM,  SVG->RCA on 03/15/19) - No s/s ischemia - Continue ASA +statin  3. Acute hypoxic respiratory failure with Pseudomonas PNA - s/p trach on 03/27/19 - completed meropenem for pseudomonas PNA - trach aspirate now growing Pseudomonas again.   -  Completed antibiotic course.  -Trach decannulated 3/13. Sats stable on 4 liters [place].      4. PAF - s/p MAZE and LAA occlusion - Maintaining NSR.  - Continue amio 200 mg daily-  - Has not been on anticoagulation with hematuria. Will need to reconsider soon.  - Add ted hose/SCDs   5. AKI  - due to shock/ATN - On CVVHD. Making minimal urine - Tunneled cath placed 3/9  - He tolerated intermittent hemodialysis on 3/10, 3/12, 3/14, 3/17  6. Hematuria -Ongoing.  Remains on CBI. -Cystoscopy 3/15 with fulgeration -  Received 1UPRBC 05/02/19 HGb  6.5>8.5 - off anticoagulation.   7. Debility, severe - continue PT/OT  8. F/E/N - Tube feeds per Cor-trak.  Clogged. Speech following for possible replacement   9. Left Pleural Effusion  - moderate to large. - s/p PleurX on 3/8.  - TCTS managing   10. Diffuse rash ? CHG bath versus amio. He has had similar rash at home due to detergent.  - Says he uses dove soap at home. I would like to stop chg bath and try dove soap if possible.  - Also will need to consider stopping amiodarone but concerned about recurrence of AF given he is not on anticoagulation.     Darrick Grinder, NP  8:46 AM   Patient seen and examined with the above-signed Advanced Practice Provider and/or Housestaff. I personally reviewed laboratory data, imaging studies and relevant notes. I independently examined the patient and formulated the important aspects of the plan. I have edited the note to reflect any of my changes or salient points. I have personally discussed the plan with the patient and/or family.  Tolerated iHD again yesterday. BP stable. Remains in NSR. Able to ambulate into hall with assistance. Rash on LE concerning for drug  rash and I suspect po amio. Will restart IV amio and hold po amio and see if it improves.   Will continue to progress. Hopefully we can get to CIR soon.   Glori Bickers, MD  9:41 AM

## 2019-05-03 NOTE — Progress Notes (Signed)
Occupational Therapy Treatment Patient Details Name: Jesse Murphy MRN: [id number] DOB: 05/01/1928 Today's Date: 05/03/2019    History of present illness 86 y.o. male with PMH: h/o CAD s/p CABG and clipping of left atrial appendage, who presented with SOB on 2/1. He became hypoxic on 2/2 with pulmonary edema and needed to be intubated on 2/2 and extubated 2/5. Pt underwent impella placement and tracheostomy on 2/9. Pt started on CRRT 2/11. Impella removal and tracheostomy 2/23. CRRT restarted 3/1 as pt could not tolerate HD. Pt on continuous bladder irrigation. [name] now removed as of 3/15.   OT comments  Pt continuing to make excellent progress toward OT goals. Focused session on continued BADL endurance with functional mobility. Pt now had trach removed and is on 4L [place] at rest. Pt able to complete bed mobility at mod A +2 and sit <> sands with mod A +2 in eva walker. Pt able to complete household level of functional mobility with eva walker on 6L [place] with VSS. Cues and education given on ECS (specifically pursed lip breathing) to maintain task. Pt continues to c/o persistent dizziness, but VSS. Plans to discuss with physician team on d/c of sternal precautions to initiate UE strengthening for next session. D/c recs remain appropriate for CIR. Will continue to follow.   Follow Up Recommendations  CIR;Supervision/Assistance - 24 hour    Equipment Recommendations  Other (comment)    Recommendations for Other Services Rehab consult    Precautions / Restrictions Precautions Precautions: Fall;Sternal Precaution Comments: NG tube       Mobility Bed Mobility Overal bed mobility: Needs Assistance Bed Mobility: Supine to Sit     Supine to sit: Mod assist;+2 for physical assistance;HOB elevated     General bed mobility comments: able to initiate LEs to EOB, assist at trunk and scooting hips toward edge; continues to c/o dizziness  Transfers Overall transfer level: Needs  assistance Equipment used: (eva walker) Transfers: Sit to/from Stand Sit to Stand: Mod assist;+2 physical assistance;+2 safety/equipment         General transfer comment: mod A +2 to rise and steady with use of momentum and cues for appropriate technique.    Balance Overall balance assessment: Needs assistance Sitting-balance support: Feet supported;Bilateral upper extremity supported Sitting balance-Leahy Scale: Fair     Standing balance support: During functional activity Standing balance-Leahy Scale: Poor Standing balance comment: reliant on external support                           ADL either performed or assessed with clinical judgement   ADL Overall ADL's : Needs assistance/impaired     Grooming: Minimal assistance;Standing                   Toilet Transfer: Minimal assistance;+2 for physical assistance;+2 for safety/equipment;RW Toilet Transfer Details (indicate cue type and reason): simulated to recliner         Functional mobility during ADLs: Minimal assistance;+2 for physical assistance;+2 for safety/equipment;Rolling walker General ADL Comments: improved activity tolerance this date for functional mobility     Vision Patient Visual Report: No change from baseline     Perception     Praxis      Cognition  Exercises     Shoulder Instructions       General Comments      Pertinent Vitals/ Pain          Home Living                                          Prior Functioning/Environment              Frequency  Min 2X/week        Progress Toward Goals  OT Goals(current goals can now be found in the care plan section)     Acute Rehab OT Goals Patient Stated Goal: get better OT Goal Formulation: With patient Time For Goal Achievement: 05/10/19 Potential to Achieve Goals: Good  Plan      Co-evaluation    PT/OT/SLP  Co-Evaluation/Treatment: Yes Reason for Co-Treatment: For patient/therapist safety;To address functional/ADL transfers PT goals addressed during session: Mobility/safety with mobility OT goals addressed during session: ADL's and self-care;Proper use of Adaptive equipment and DME;Strengthening/ROM      AM-PAC OT "6 Clicks" Daily Activity     Outcome Measure   Help from another person eating meals?: A Little Help from another person taking care of personal grooming?: A Little Help from another person toileting, which includes using toliet, bedpan, or urinal?: A Lot Help from another person bathing (including washing, rinsing, drying)?: A Lot Help from another person to put on and taking off regular upper body clothing?: A Lot Help from another person to put on and taking off regular lower body clothing?: A Lot 6 Click Score: 14    End of Session Equipment Utilized During Treatment: Oxygen;Rolling walker  OT Visit Diagnosis: Unsteadiness on feet (R26.81);Other abnormalities of gait and mobility (R26.89);Muscle weakness (generalized) (M62.81)   Activity Tolerance Patient tolerated treatment well   Patient Left in chair;with call bell/phone within reach;with nursing/sitter in room   Nurse Communication Mobility status        Time: 6579-0383 OT Time Calculation (min): 23 min  Charges: OT General Charges $OT Visit: 1 Visit OT Treatments $Self Care/Home Management : 8-22 mins  Zenovia Jarred, MSOT, OTR/L [facility] Office Number: [phone] Pager: [phone]  Zenovia Jarred 05/03/2019, 2:19 PM

## 2019-05-03 NOTE — Progress Notes (Signed)
Physical Therapy Treatment Patient Details Name: Jesse Murphy MRN: [id number] DOB: 1928/06/25 Today's Date: 05/03/2019    History of Present Illness 86 y.o. male with PMH: h/o CAD s/p CABG and clipping of left atrial appendage, who presented with SOB on 2/1. He became hypoxic on 2/2 with pulmonary edema and needed to be intubated on 2/2 and extubated 2/5. Pt underwent impella placement and tracheostomy on 2/9. Pt started on CRRT 2/11. Impella removal and tracheostomy 2/23. CRRT restarted 3/1 as pt could not tolerate HD. Pt on continuous bladder irrigation.s/p cystoscopy with evacuation of clot and fulgoration of bladder neck bleeders 3/15.    PT Comments    Pt making excellent progress. Recommend CIR consult. Expect he will reach supervision level with CIR.    Follow Up Recommendations  CIR     Equipment Recommendations  Other (comment)(To be determined)    Recommendations for Other Services       Precautions / Restrictions Precautions Precautions: Fall;Sternal Precaution Comments: NG tube    Mobility  Bed Mobility Overal bed mobility: Needs Assistance Bed Mobility: Supine to Sit     Supine to sit: Mod assist;+2 for physical assistance;HOB elevated     General bed mobility comments: Assist to bring legs off of bed, elevate trunk into sitting and bring hips to EOB.   Transfers Overall transfer level: Needs assistance Equipment used: (eva walker) Transfers: Sit to/from Stand Sit to Stand: Mod assist;+2 physical assistance;+2 safety/equipment         General transfer comment: Assist to bring hips up and for balance. Verbal cues for sternal precautions  Ambulation/Gait Ambulation/Gait assistance: +2 physical assistance;Min assist Gait Distance (Feet): 100 Feet Assistive device: (Eva walker) Gait Pattern/deviations: Step-through pattern;Decreased step length - right;Decreased step length - left;Shuffle Gait velocity: decr Gait velocity interpretation: <1.31 ft/sec,  indicative of household ambulator General Gait Details: Assist for balance and support. Verbal cues to stand more erect. Pt amb on 6L of O2 with SpO2 >905   Stairs             Wheelchair Mobility    Modified Rankin (Stroke Patients Only)       Balance Overall balance assessment: Needs assistance Sitting-balance support: Feet supported;Bilateral upper extremity supported Sitting balance-Leahy Scale: Fair     Standing balance support: During functional activity Standing balance-Leahy Scale: Poor Standing balance comment: Eva walker and min assist for static standing                            Cognition Arousal/Alertness: Awake/alert Behavior During Therapy: Flat affect Overall Cognitive Status: No family/caregiver present to determine baseline cognitive functioning                                 General Comments: following 1 step commands      Exercises      General Comments        Pertinent Vitals/Pain Pain Assessment: Faces Faces Pain Scale: No hurt    Home Living                      Prior Function            PT Goals (current goals can now be found in the care plan section) Acute Rehab PT Goals Patient Stated Goal: get better Progress towards PT goals: Progressing toward goals    Frequency  Min 3X/week      PT Plan Discharge plan needs to be updated;Frequency needs to be updated    Co-evaluation PT/OT/SLP Co-Evaluation/Treatment: Yes Reason for Co-Treatment: For patient/therapist safety PT goals addressed during session: Mobility/safety with mobility OT goals addressed during session: ADL's and self-care;Proper use of Adaptive equipment and DME;Strengthening/ROM      AM-PAC PT "6 Clicks" Mobility   Outcome Measure  Help needed turning from your back to your side while in a flat bed without using bedrails?: A Lot Help needed moving from lying on your back to sitting on the side of a flat bed  without using bedrails?: A Lot Help needed moving to and from a bed to a chair (including a wheelchair)?: A Lot Help needed standing up from a chair using your arms (e.g., wheelchair or bedside chair)?: A Lot Help needed to walk in hospital room?: A Little Help needed climbing 3-5 steps with a railing? : Total 6 Click Score: 12    End of Session Equipment Utilized During Treatment: Gait belt;Oxygen Activity Tolerance: Patient tolerated treatment well Patient left: in chair;with call bell/phone within reach;with chair alarm set Nurse Communication: Mobility status PT Visit Diagnosis: Other abnormalities of gait and mobility (R26.89);Muscle weakness (generalized) (M62.81)     Time: 0981-1914 PT Time Calculation (min) (ACUTE ONLY): 23 min  Charges:  $Gait Training: 8-22 mins                     [place] Pager [phone] Office [place] 05/03/2019, 2:53 PM

## 2019-05-03 NOTE — Progress Notes (Signed)
Patient ID: [name], male   DOB: 06-23-1928, 86 y.o.   MRN: [id number] EVENING ROUNDS NOTE :     [address]       ,[place] [address]             [phone]                 3 Days Post-Op Procedure(s) (LRB): CYSTOSCOPY WITH FULGERATION (N/A)  Total Length of Stay:  LOS: 50 days  BP (!) 123/50   Pulse 73   Temp 97.6 F (36.4 C) (Oral)   Resp 16   Ht 5\' 8"  (1.727 m)   Wt 85.5 kg   SpO2 100%   BMI 28.66 kg/m   .Intake/Output      03/17 0701 - 03/18 0700 03/18 0701 - 03/19 0700   P.O. 50    I.V. (mL/kg) 50 (0.6) 24.5 (0.3)   Blood 605    Other 12020    NG/GT 770 400   Total Intake(mL/kg) 13495 (157.8) 424.5 (5)   Urine (mL/kg/hr) 12300 (6) 3700 (3.8)   Other 2500    Stool  0   Chest Tube 0 1000   Total Output 14800 4700   Net -1305 -4275.5        Stool Occurrence  1 x     . sodium chloride    . sodium chloride    . sodium chloride Stopped (05/01/19 1307)  . amiodarone 30 mg/hr (05/03/19 1700)  . feeding supplement (JEVITY 1.5 CAL/FIBER) 1,000 mL (05/03/19 0944)  . magnesium sulfate bolus IVPB    . norepinephrine (LEVOPHED) Adult infusion Stopped (04/24/19 1800)     Lab Results  Component Value Date   WBC 9.2 05/02/2019   HGB 8.5 (L) 05/02/2019   HCT 26.8 (L) 05/02/2019   PLT 142 (L) 05/02/2019   GLUCOSE 125 (H) 05/03/2019   CHOL 117 03/16/2019   TRIG 52 03/16/2019   HDL 33 (L) 03/16/2019   LDLCALC 74 03/16/2019   ALT 11 04/23/2019   AST 23 04/23/2019   NA 138 05/03/2019   K 4.8 05/03/2019   CL 98 05/03/2019   CREATININE 3.83 (H) 05/03/2019   BUN 50 (H) 05/03/2019   CO2 29 05/03/2019   TSH 3.683 03/13/2019   INR 1.7 (H) 03/27/2019   HGBA1C 6.1 (H) 03/13/2019   Slow progress , but up in chair talking on phone   Grace Isaac MD  Beeper [phone] Office [phone] 05/03/2019 6:19 PM

## 2019-05-04 LAB — GLUCOSE, CAPILLARY
Glucose-Capillary: 105 mg/dL — ABNORMAL HIGH (ref 70–99)
Glucose-Capillary: 114 mg/dL — ABNORMAL HIGH (ref 70–99)
Glucose-Capillary: 114 mg/dL — ABNORMAL HIGH (ref 70–99)
Glucose-Capillary: 116 mg/dL — ABNORMAL HIGH (ref 70–99)
Glucose-Capillary: 131 mg/dL — ABNORMAL HIGH (ref 70–99)
Glucose-Capillary: 187 mg/dL — ABNORMAL HIGH (ref 70–99)
Glucose-Capillary: 96 mg/dL (ref 70–99)

## 2019-05-04 LAB — COOXEMETRY PANEL
Carboxyhemoglobin: 1.8 % — ABNORMAL HIGH (ref 0.5–1.5)
Methemoglobin: 1.5 % (ref 0.0–1.5)
O2 Saturation: 77.8 %
Total hemoglobin: 8.3 g/dL — ABNORMAL LOW (ref 12.0–16.0)

## 2019-05-04 LAB — CBC
HCT: 27.6 % — ABNORMAL LOW (ref 39.0–52.0)
Hemoglobin: 8.4 g/dL — ABNORMAL LOW (ref 13.0–17.0)
MCH: 28.5 pg (ref 26.0–34.0)
MCHC: 30.4 g/dL (ref 30.0–36.0)
MCV: 93.6 fL (ref 80.0–100.0)
Platelets: 190 10*3/uL (ref 150–400)
RBC: 2.95 MIL/uL — ABNORMAL LOW (ref 4.22–5.81)
RDW: 16.3 % — ABNORMAL HIGH (ref 11.5–15.5)
WBC: 10.9 10*3/uL — ABNORMAL HIGH (ref 4.0–10.5)
nRBC: 0 % (ref 0.0–0.2)

## 2019-05-04 LAB — RENAL FUNCTION PANEL
Albumin: 2.5 g/dL — ABNORMAL LOW (ref 3.5–5.0)
Anion gap: 14 (ref 5–15)
BUN: 88 mg/dL — ABNORMAL HIGH (ref 8–23)
CO2: 26 mmol/L (ref 22–32)
Calcium: 7.9 mg/dL — ABNORMAL LOW (ref 8.9–10.3)
Chloride: 93 mmol/L — ABNORMAL LOW (ref 98–111)
Creatinine, Ser: 4.95 mg/dL — ABNORMAL HIGH (ref 0.61–1.24)
GFR calc Af Amer: 11 mL/min — ABNORMAL LOW (ref >60)
GFR calc non Af Amer: 10 mL/min — ABNORMAL LOW (ref >60)
Glucose, Bld: 156 mg/dL — ABNORMAL HIGH (ref 70–99)
Phosphorus: 4.5 mg/dL (ref 2.5–4.6)
Potassium: 5.2 mmol/L — ABNORMAL HIGH (ref 3.5–5.1)
Sodium: 133 mmol/L — ABNORMAL LOW (ref 135–145)

## 2019-05-04 LAB — MAGNESIUM: Magnesium: 2.3 mg/dL (ref 1.7–2.4)

## 2019-05-04 MED ORDER — SODIUM CHLORIDE 0.9 % IV SOLN
510.0000 mg | Freq: Once | INTRAVENOUS | Status: AC
  Administered 2019-05-04: 510 mg via INTRAVENOUS
  Filled 2019-05-04: qty 17

## 2019-05-04 MED ORDER — SODIUM CHLORIDE 0.9 % IV SOLN
10.0000 mg | INTRAVENOUS | Status: DC
  Administered 2019-05-04: 10 mg via INTRAVENOUS
  Filled 2019-05-04 (×2): qty 1

## 2019-05-04 MED ORDER — METOCLOPRAMIDE HCL 5 MG/ML IJ SOLN
10.0000 mg | Freq: Four times a day (QID) | INTRAMUSCULAR | Status: DC
  Administered 2019-05-04 – 2019-05-07 (×11): 10 mg via INTRAVENOUS
  Filled 2019-05-04 (×11): qty 2

## 2019-05-04 MED ORDER — CALCIUM ACETATE (PHOS BINDER) 667 MG/5ML PO SOLN
667.0000 mg | Freq: Three times a day (TID) | ORAL | Status: DC
  Administered 2019-05-04 – 2019-05-20 (×37): 667 mg
  Filled 2019-05-04 (×59): qty 5

## 2019-05-04 MED ORDER — HYDROXYZINE HCL 10 MG/5ML PO SYRP
10.0000 mg | ORAL_SOLUTION | Freq: Four times a day (QID) | ORAL | Status: DC
  Administered 2019-05-04 – 2019-05-11 (×25): 10 mg
  Filled 2019-05-04 (×28): qty 5

## 2019-05-04 MED ORDER — PRO-STAT SUGAR FREE PO LIQD
60.0000 mL | Freq: Two times a day (BID) | ORAL | Status: DC
  Administered 2019-05-04 – 2019-05-09 (×10): 60 mL
  Filled 2019-05-04 (×11): qty 60

## 2019-05-04 MED ORDER — JEVITY 1.5 CAL/FIBER PO LIQD
1000.0000 mL | ORAL | Status: DC
  Administered 2019-05-04 – 2019-05-08 (×6): 1000 mL via ORAL
  Filled 2019-05-04 (×9): qty 1000

## 2019-05-04 MED ORDER — HEPARIN SODIUM (PORCINE) 1000 UNIT/ML IJ SOLN
INTRAMUSCULAR | Status: AC
  Administered 2019-05-04: 1000 [IU]
  Filled 2019-05-04: qty 4

## 2019-05-04 NOTE — Progress Notes (Signed)
PT Cancellation Note  Patient Details Name: Jesse Murphy MRN: [id number] DOB: Jun 26, 1928   Cancelled Treatment:    Reason Eval/Treat Not Completed: Patient at procedure or test/unavailable(Pt on HD)   Shary Decamp [facility] 05/04/2019, 10:29 AM [place] Pager [phone] Office [phone]

## 2019-05-04 NOTE — Progress Notes (Signed)
Renal Navigator met with patient and son to discuss initiating referral for OP HD treatment. Patient's son would like his father to remain with [place] and therefore requests OP HD treatment in [place], which is the closest [facility] HD clinic to his father's home. Navigator will submit referral on Monday.  Renal Navigator notes hopes for CIR admission. Navigator can continue to work on referral even after admission to CIR, if that were to happen first.  Alphonzo Cruise, [place] Renal Navigator [phone]

## 2019-05-04 NOTE — Progress Notes (Signed)
Inpatient Rehabilitation Admissions Coordinator  Rehab Admissions Coordinator Note:  Patient was screened by Cleatrice Burke for appropriateness for an Inpatient Acute Rehab Consult per change in therapy recommendations.   At this time, we are recommending Inpatient Rehab consult. Please place order for consult if pt would like to be considered for admit. Please advise.  Cleatrice Burke RN MSN 05/04/2019, 8:53 AM  I can be reached at [phone].

## 2019-05-04 NOTE — Progress Notes (Signed)
Nutrition Follow up   DOCUMENTATION CODES:   Not applicable  INTERVENTION:  Increase tube feeding:  -Jevity 1.5 @ 55 ml/hr via [name] (1320 ml) -60 ml Prostat BID -Juven BID  Provides: 2380 kcals, 144 grams protein, 1003 ml free water. Meets 100% of needs. Decreased rate as PO intake progresses.   -Magic Cup TID  NUTRITION DIAGNOSIS:   Increased nutrient needs related to post-op healing as evidenced by estimated needs.  Ongoing  GOAL:   Patient will meet greater than or equal to 90% of their needs   Addressed via tube feeding   MONITOR:   Weight trends, Diet advancement, Vent status, Skin, TF tolerance, Labs, I & O's  REASON FOR ASSESSMENT:   Consult Enteral/tube feeding initiation and management  ASSESSMENT:   Patient with PMH significant for CAD s/p PCI, prostate cancer, HTN, CHF, and DM. Presents this admission with chronic a.fib with RVR.   1/28- s/p L heart cath  1/30- s/p CABG x3, MAZE, extubated  2/2- re-intubated  2/5- extubated 2/8- re-intubated  2/10- s/p impella, trach, NGT placed-start trickle feeding 2/11- start CRRT 2/23- removal of impella, [name] dislodged  2/24- CRRT stop 3/1- failed iHD, back on CRRT 3/2- s/p [name], nocturnal feedings started 3/4- diet advanced DYS2 , nectar thick  3/8- s/p L PleurX catheter and L TDC 3/10- tolerated iHD 3/13- s/p decannulation  3/15- s/p cystoscopy  3/17- [name] clogged, s/p replacement   Tolerating iHD. Continues with bladder irrigation. Appetite remains poor. Diet downgraded to DYS1 with honey thick liquids due to pt spitting up food after eating. Not drinking Nepro shakes. Nausea off/on. Increase tube feeding to meet 100% of needs until intake progresses.  Admission weight: 89.3 kg  Current weight: 86.5 kg   Chest tube: 1000 ml x 24 hrs  Last HD today: 1978 net UF  Medications: phsolo, aranesp, SS novolog, lantus, rena-vit Labs: Na 133 (L) K 5.2 (H) CBG 97-187  Diet Order:   Diet Order             DIET - DYS 1 Room service appropriate? Yes; Fluid consistency: Honey Thick  Diet effective now              EDUCATION NEEDS:   Not appropriate for education at this time  Skin:  Skin Assessment: Skin Integrity Issues: Skin Integrity Issues:: Stage I, DTI, Stage II DTI: chest, R heel Stage I: vertebral column Stage II: buttocks Incisions: chest, leg  Last BM:  3/18  Height:   Ht Readings from Last 1 Encounters:  03/26/19 5\' 8"  (1.727 m)    Weight:   Wt Readings from Last 1 Encounters:  05/04/19 86.5 kg    Ideal Body Weight:  70 kg  BMI:  Body mass index is 29 kg/m.  Estimated Nutritional Needs:   Kcal:  2200-2400 kcal  Protein:  135-155 grams  Fluid:  >/= 2.2 L/day   Mariana Single RD, LDN Clinical Nutrition Pager # - [phone]

## 2019-05-04 NOTE — Progress Notes (Signed)
[facility] Progress Note    Assessment/ Plan:   1. AKI(Cr was 0.7 prior to surgery)- due to postoperative CHF/cardiogenic - started on CRRT  03/29/19 - 2/24. Attempted IHD on 2/26 with minimal UF --> increased pressors and rapid afib.  Then CRRT on 3/1-- 3/8. Had been ontoleratingnet UF with  CRRT.   BUN and crt rising off CRRT which indicates he will remain HD dep. Tolerating IHD.  Next planned 3/19, on MWF schedule.  Will vein map for consideration of perm access   2. Acute systolic CHF/cardiogenic shock- EF 25-30%; s/p Impella- now out. optimize volume as above. On midodrine  15 mg TID.  off pressors- still overloaded, trying to chip away at volume   3. CAD s/p [id number] 09/29/46 complicated by cardiogenic shock and ARF.Postop echo EF 25-30  4. Acute hypoxic respiratory failure with pseudomonas pneumonia- s/p trach on 03/27/19.s/pabx per PCCM-> completed abx  5. 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  6. ABLA- Hx Melen and hematuria. Transfuse as needed.Increase aranesp to 200 q week.  % sat 11, feraheme x 1 today and then can get the rest of his iron load with HD  7. Protein malnutrition- moderate to severe- alb 2.0  8. Secondary hyperparathyroidism/bone mineral -  phos rising,s/p phoslo -.  pth 116  9. Rash: looks like drug reaction--> emollients/ hydroxyzine prn, ? Abx, amio  10. Disposition-  trach is out--> this was a huge step re: outpatient HD candidacy.  Will see how he does throughout week and if continues to improve will consider perm access (Ie AVF/AVG)--> only has L IJ TDC for now  Subjective:    HD today, 2L goal.  Tired but tolerating rx well.     Objective:   BP (!) 127/49   Pulse 65   Temp 97.9 F (36.6 C) (Oral)   Resp 18   Ht 5\' 8"  (1.727 m)   Wt 88.9 kg   SpO2 100%   BMI 29.80 kg/m   Intake/Output Summary (Last 24 hours) at 05/04/2019  1011 Last data filed at 05/04/2019 0600 Gross per 24 hour  Intake 3921.08 ml  Output 6120 ml  Net -2198.92 ml   Weight change: -5.3 kg  Physical Exam: Gen: older gentleman, NAD NECK: trach site dressed CVS: RRR Resp: clear anteriorly, bases diminished Abd: soft Ext: 1+ dependent and flank edema, improving, lacy rash/ erythematous, itchy over bilateral legs, some on stomach [facility]  Imaging: DG Chest Port 1 View  Result Date: 05/03/2019 CLINICAL DATA:  CABG EXAM: PORTABLE CHEST 1 VIEW COMPARISON:  05/01/2019 FINDINGS: Support devices are stable. Changes of CABG. Increasing layering effusions and bilateral airspace disease, likely edema. Bibasilar atelectasis. IMPRESSION: Worsening layering effusions, edema and bibasilar atelectasis. Electronically Signed   By: Rolm Baptise M.D.   On: 05/03/2019 08:40    Labs: BMET Recent Labs  Lab 04/29/19 0449 04/30/19 0337 04/30/19 1441 05/01/19 0439 05/02/19 0425 05/03/19 0441 05/04/19 0429  NA 135 132* 132* 137 137 138 133*  K 5.0 5.4* 5.4* 4.7 5.2* 4.8 5.2*  CL 96* 91* 93* 98 98 98 93*  CO2 28 27 24 28 25 29 26   GLUCOSE 108* 101* 141* 138* 94 125* 156*  BUN 76* 94* 98* 52* 79* 50* 88*  CREATININE 4.58* 5.45* 5.83* 3.73* 5.04* 3.83* 4.95*  CALCIUM 8.0* 7.9* 7.8* 7.8* 8.2* 8.4* 7.9*  PHOS 7.4* 7.9* 8.5* 4.8* 5.7* 4.7* 4.5   CBC  Recent Labs  Lab 04/30/19 1441 05/02/19 0657 05/02/19 2030 05/04/19 0615  WBC 17.6* 10.0 9.2 10.9*  HGB 8.5* 6.5* 8.5* 8.4*  HCT 26.7* 21.4* 26.8* 27.6*  MCV 93.0 96.0 92.7 93.6  PLT 203 182 142* 190    Medications:    . aspirin  81 mg Per Tube Daily  . atorvastatin  80 mg Per Tube q1800  . calcium acetate  667 mg Oral TID WC  . chlorhexidine  15 mL Mouth Rinse BID  . darbepoetin (ARANESP) injection - DIALYSIS  200 mcg Intravenous Q Wed-HD  . feeding supplement (NEPRO CARB STEADY)  237 mL Oral BID BM  . feeding supplement (PRO-STAT SUGAR FREE 64)  30 mL Per Tube BID  . Gerhardt's  butt cream   Topical BID  . heparin      . hydrocortisone   Rectal BID  . insulin aspart  0-24 Units Subcutaneous Q4H  . insulin glargine  36 Units Subcutaneous Daily  . lipase/protease/amylase)  20,880 Units Per Tube Once   And  . sodium bicarbonate  650 mg Per Tube Once  . mouth rinse  15 mL Mouth Rinse q12n4p  . midodrine  15 mg Per Tube TID WC  . multivitamin  1 tablet Per Tube QHS  . nutrition supplement (JUVEN)  1 packet Per Tube BID BM  . pantoprazole (PROTONIX) IV  40 mg Intravenous Q24H  . simethicone  80 mg Per Tube BID PC  . sodium chloride flush  10-40 mL Intracatheter Q12H      Madelon Lips, MD 05/04/2019, 10:11 AM

## 2019-05-04 NOTE — Progress Notes (Signed)
EVENING ROUNDS NOTE :     [address]       [place], [address]             [phone]                 4 Days Post-Op Procedure(s) (LRB): CYSTOSCOPY WITH FULGERATION (N/A)   Total Length of Stay:  LOS: 51 days  Events:  No events Resting comfortably    BP (!) 107/44 (BP Location: Left Arm)   Pulse 65   Temp (P) 99.3 F (37.4 C)   Resp 18   Ht 5\' 8"  (1.727 m)   Wt 86.5 kg   SpO2 97%   BMI 29.00 kg/m   CVP:  [4 mmHg-59 mmHg] 8 mmHg     . sodium chloride    . sodium chloride    . sodium chloride Stopped (05/01/19 1307)  . famotidine (PEPCID) IV Stopped (05/04/19 1559)  . feeding supplement (JEVITY 1.5 CAL/FIBER) 1,000 mL (05/04/19 1531)  . magnesium sulfate bolus IVPB    . norepinephrine (LEVOPHED) Adult infusion Stopped (04/24/19 1800)    I/O last 3 completed shifts: In: 7861.1 [P.O.:50; I.V.:241.1; Other:6000; NG/GT:1570] Out: 78242 [id number]; Chest Tube:1000]   CBC Latest Ref Rng & Units 05/04/2019 05/02/2019 05/02/2019  WBC 4.0 - 10.5 K/uL 10.9(H) 9.2 10.0  Hemoglobin 13.0 - 17.0 g/dL 8.4(L) 8.5(L) 6.5(LL)  Hematocrit 39.0 - 52.0 % 27.6(L) 26.8(L) 21.4(L)  Platelets 150 - 400 K/uL 190 142(L) 182    BMP Latest Ref Rng & Units 05/04/2019 05/03/2019 05/02/2019  Glucose 70 - 99 mg/dL 156(H) 125(H) 94  BUN 8 - 23 mg/dL 88(H) 50(H) 79(H)  Creatinine 0.61 - 1.24 mg/dL 4.95(H) 3.83(H) 5.04(H)  Sodium 135 - 145 mmol/L 133(L) 138 137  Potassium 3.5 - 5.1 mmol/L 5.2(H) 4.8 5.2(H)  Chloride 98 - 111 mmol/L 93(L) 98 98  CO2 22 - 32 mmol/L 26 29 25   Calcium 8.9 - 10.3 mg/dL 7.9(L) 8.4(L) 8.2(L)    ABG    Component Value Date/Time   PHART 7.488 (H) 04/07/2019 0405   PCO2ART 34.4 04/07/2019 0405   PO2ART 101.0 04/07/2019 0405   HCO3 25.9 04/07/2019 0405   TCO2 36 (H) 04/23/2019 0449   ACIDBASEDEF 1.0 04/05/2019 0540   O2SAT 77.8 05/04/2019 [place], MD 05/04/2019 5:43 PM

## 2019-05-04 NOTE — Progress Notes (Addendum)
[address]       [place] [address]             [phone]      4 Days Post-Op Procedure(s) (LRB): CYSTOSCOPY WITH FULGERATION (N/A) Subjective: Getting dialysis currently Poor appetite  Objective: Vital signs in last 24 hours: Temp:  [97.6 F (36.4 C)-98.2 F (36.8 C)] 97.9 F (36.6 C) (03/19 0900) Pulse Rate:  [64-81] 65 (03/19 1000) Cardiac Rhythm: Normal sinus rhythm (03/19 0400) Resp:  [11-24] 18 (03/19 1000) BP: (102-129)/(39-54) 127/49 (03/19 1000) SpO2:  [91 %-100 %] 100 % (03/19 1000) Weight:  [83.7 kg-88.9 kg] 88.9 kg (03/19 0705)  Hemodynamic parameters for last 24 hours: CVP:  [2 mmHg-9 mmHg] 6 mmHg  Intake/Output from previous day: 03/18 0701 - 03/19 0700 In: 4041.1 [I.V.:241.1; NG/GT:800] Out: 7220 [Urine:6220; Chest Tube:1000] Intake/Output this shift: No intake/output data recorded.  PE Fatigued Lungs: dim in bases Cor:RRR Ext: + pedal edema Incis: healing well  Lab Results: Recent Labs    05/02/19 2030 05/04/19 0615  WBC 9.2 10.9*  HGB 8.5* 8.4*  HCT 26.8* 27.6*  PLT 142* 190   BMET:  Recent Labs    05/03/19 0441 05/04/19 0429  NA 138 133*  K 4.8 5.2*  CL 98 93*  CO2 29 26  GLUCOSE 125* 156*  BUN 50* 88*  CREATININE 3.83* 4.95*  CALCIUM 8.4* 7.9*    PT/INR: No results for input(s): LABPROT, INR in the last 72 hours. ABG    Component Value Date/Time   PHART 7.488 (H) 04/07/2019 0405   HCO3 25.9 04/07/2019 0405   TCO2 36 (H) 04/23/2019 0449   ACIDBASEDEF 1.0 04/05/2019 0540   O2SAT 77.8 05/04/2019 0429   CBG (last 3)  Recent Labs    05/04/19 0001 05/04/19 0428 05/04/19 0914  GLUCAP 114* 187* 116*    Meds Scheduled Meds: . aspirin  81 mg Per Tube Daily  . atorvastatin  80 mg Per Tube q1800  . calcium acetate  667 mg Oral TID WC  . chlorhexidine  15 mL Mouth Rinse BID  . darbepoetin (ARANESP) injection - DIALYSIS  200 mcg Intravenous Q Wed-HD  . feeding supplement (NEPRO CARB STEADY)   237 mL Oral BID BM  . feeding supplement (PRO-STAT SUGAR FREE 64)  30 mL Per Tube BID  . Gerhardt's butt cream   Topical BID  . heparin      . hydrocortisone   Rectal BID  . insulin aspart  0-24 Units Subcutaneous Q4H  . insulin glargine  36 Units Subcutaneous Daily  . lipase/protease/amylase)  20,880 Units Per Tube Once   And  . sodium bicarbonate  650 mg Per Tube Once  . mouth rinse  15 mL Mouth Rinse q12n4p  . midodrine  15 mg Per Tube TID WC  . multivitamin  1 tablet Per Tube QHS  . nutrition supplement (JUVEN)  1 packet Per Tube BID BM  . pantoprazole (PROTONIX) IV  40 mg Intravenous Q24H  . simethicone  80 mg Per Tube BID PC  . sodium chloride flush  10-40 mL Intracatheter Q12H   Continuous Infusions: . sodium chloride    . sodium chloride    . sodium chloride Stopped (05/01/19 1307)  . amiodarone 30 mg/hr (05/04/19 0600)  . feeding supplement (JEVITY 1.5 CAL/FIBER) 1,000 mL (05/03/19 0944)  . magnesium sulfate bolus IVPB    . norepinephrine (LEVOPHED) Adult infusion Stopped (04/24/19 1800)   PRN Meds:.sodium chloride, sodium  chloride, oxyCODONE **AND** acetaminophen, alteplase, dextrose, hydrOXYzine, hyoscyamine, levalbuterol, lidocaine (PF), lidocaine-prilocaine, loperamide HCl, Melatonin, ondansetron (ZOFRAN) IV, pentafluoroprop-tetrafluoroeth, promethazine, Resource ThickenUp Clear, sodium chloride flush, sodium chloride flush  Xrays DG Chest Port 1 View  Result Date: 05/03/2019 CLINICAL DATA:  CABG EXAM: PORTABLE CHEST 1 VIEW COMPARISON:  05/01/2019 FINDINGS: Support devices are stable. Changes of CABG. Increasing layering effusions and bilateral airspace disease, likely edema. Bibasilar atelectasis. IMPRESSION: Worsening layering effusions, edema and bibasilar atelectasis. Electronically Signed   By: Rolm Baptise M.D.   On: 05/03/2019 08:40    Assessment/Plan:   1 doing ok currently on dialysis- as per nephrology  2 stable systolic HF, management as per HF team 3  sats ok on 4 liters 4 H/H pretty stable, hematuria much ess currently  On CBI 5 cont pleurx drainage schedule 6 rash appears stable 7 conts TF's  8 push therapies as able John Giovanni PA-C Page [phone] 05/04/2019   HD completed today with removal of 2 L Patient with some shortness of breath and increased oxygen demand-we will drain left Pleurx catheter today. Patient complains of difficulty swallowing with reflux symptoms and gastric bloating-we will resume a course of Reglan.  Continue tube feeds until his oral intake is adequate.  patient examined and medical record reviewed,agree with above note. Tharon Aquas Trigt III 05/04/2019

## 2019-05-04 NOTE — Progress Notes (Addendum)
Patient ID: Jesse Murphy, male   DOB: 04-17-1928, 86 y.o.   MRN: [id number] f    Advanced Heart Failure Rounding Note  PCP-Cardiologist: Kate Sable, MD   Subjective:    Events - Impella removed 2/23. Intraoperative TEE showed EF ~40% - Had tunneled cath placed 3/9 and left PleurX tube.  - Tolerated iHD on 3/10 and 3/12 - Trach decannulated 3/13 - Cystoscopy 3/15 with radiation cystitis -> fulgeration - Tolerated iHD on 3/17 and received PRBCs for hgB 6.5   Had ~6 sec pause last PM around 2300. Was asleep at the time. No recurrence.   Currently getting HD.   Continues w/ rash on bilateral LEs and abdomen/torso. Pruritic   Feels tired today.    Objective:   Weight Range: 88.9 kg Body mass index is 29.8 kg/m.   Vital Signs:   Temp:  [97.6 F (36.4 C)-98.2 F (36.8 C)] 97.9 F (36.6 C) (03/19 0900) Pulse Rate:  [64-81] 66 (03/19 0930) Resp:  [11-24] 14 (03/19 0930) BP: (102-129)/(39-54) 113/44 (03/19 0930) SpO2:  [91 %-100 %] 100 % (03/19 0930) Weight:  [83.7 kg-88.9 kg] 88.9 kg (03/19 0705) Last BM Date: 05/03/19  Weight change: Filed Weights   05/02/19 1859 05/04/19 0500 05/04/19 0705  Weight: 85.5 kg 83.7 kg 88.9 kg    Intake/Output:   Intake/Output Summary (Last 24 hours) at 05/04/2019 0955 Last data filed at 05/04/2019 0600 Gross per 24 hour  Intake 3961.08 ml  Output 6120 ml  Net -2158.92 ml     Physical Exam   General:  Fatigue/weak appearing WM in bed getting HD.  No resp difficulty HEENT: normal Anicteric + NGT Neck:  supple. no JVD. Bandaged over previous trach site. Carotids 2+ bilat; no bruits. No lymphadenopathy or thryomegaly appreciated. Cor: PMI nondisplaced. Regular rate & rhythm. No rubs, gallops or murmurs. Left subclavian HD catheter Lungs: CTA. No wheeze.  Abdomen: soft, nontender, + mildly distended. No hepatosplenomegaly. No bruits or masses. Good bowel sounds. + diffuse erythematous rash  Extremities: no cyanosis, clubbing,  rash, edema + maculopaular rash  Neuro: alert & orientedx3, cranial nerves grossly intact. moves all 4 extremities w/o difficulty. Affect pleasant GU: +Foley w/ yellowish/white penile discharge  Skin: Diffuse macular pink rash bilateral LEs, abdomen and torso    Telemetry    NSR 70s, ~6-7 sec pause overnight personally reviewed.    Labs    CBC Recent Labs    05/02/19 2030 05/04/19 0615  WBC 9.2 10.9*  HGB 8.5* 8.4*  HCT 26.8* 27.6*  MCV 92.7 93.6  PLT 142* 841   Basic Metabolic Panel Recent Labs    05/03/19 0441 05/04/19 0429  NA 138 133*  K 4.8 5.2*  CL 98 93*  CO2 29 26  GLUCOSE 125* 156*  BUN 50* 88*  CREATININE 3.83* 4.95*  CALCIUM 8.4* 7.9*  MG 2.2 2.3  PHOS 4.7* 4.5   Liver Function Tests Recent Labs    05/03/19 0441 05/04/19 0429  ALBUMIN 2.8* 2.5*   No results for input(s): LIPASE, AMYLASE in the last 72 hours. Cardiac Enzymes No results for input(s): CKTOTAL, CKMB, CKMBINDEX, TROPONINI in the last 72 hours.  BNP: BNP (last 3 results) Recent Labs    03/22/19 0401 03/26/19 0626 03/27/19 1255  BNP 340.4* 687.5* 800.4*    ProBNP (last 3 results) No results for input(s): PROBNP in the last 8760 hours.   D-Dimer No results for input(s): DDIMER in the last 72 hours. Hemoglobin A1C No results for input(s):  HGBA1C in the last 72 hours. Fasting Lipid Panel No results for input(s): CHOL, HDL, LDLCALC, TRIG, CHOLHDL, LDLDIRECT in the last 72 hours. Thyroid Function Tests No results for input(s): TSH, T4TOTAL, T3FREE, THYROIDAB in the last 72 hours.  Invalid input(s): FREET3  Other results:   Imaging    No results found.   Medications:     Scheduled Medications: . aspirin  81 mg Per Tube Daily  . atorvastatin  80 mg Per Tube q1800  . calcium acetate  667 mg Oral TID WC  . chlorhexidine  15 mL Mouth Rinse BID  . darbepoetin (ARANESP) injection - DIALYSIS  200 mcg Intravenous Q Wed-HD  . feeding supplement (NEPRO CARB STEADY)   237 mL Oral BID BM  . feeding supplement (PRO-STAT SUGAR FREE 64)  30 mL Per Tube BID  . Gerhardt's butt cream   Topical BID  . heparin      . hydrocortisone   Rectal BID  . insulin aspart  0-24 Units Subcutaneous Q4H  . insulin glargine  36 Units Subcutaneous Daily  . lipase/protease/amylase)  20,880 Units Per Tube Once   And  . sodium bicarbonate  650 mg Per Tube Once  . mouth rinse  15 mL Mouth Rinse q12n4p  . midodrine  15 mg Per Tube TID WC  . multivitamin  1 tablet Per Tube QHS  . nutrition supplement (JUVEN)  1 packet Per Tube BID BM  . pantoprazole (PROTONIX) IV  40 mg Intravenous Q24H  . simethicone  80 mg Per Tube BID PC  . sodium chloride flush  10-40 mL Intracatheter Q12H    Infusions: . sodium chloride    . sodium chloride    . sodium chloride Stopped (05/01/19 1307)  . amiodarone 30 mg/hr (05/04/19 0600)  . feeding supplement (JEVITY 1.5 CAL/FIBER) 1,000 mL (05/03/19 0944)  . magnesium sulfate bolus IVPB    . norepinephrine (LEVOPHED) Adult infusion Stopped (04/24/19 1800)    PRN Medications: sodium chloride, sodium chloride, oxyCODONE **AND** acetaminophen, alteplase, dextrose, hydrOXYzine, hyoscyamine, levalbuterol, lidocaine (PF), lidocaine-prilocaine, loperamide HCl, Melatonin, ondansetron (ZOFRAN) IV, pentafluoroprop-tetrafluoroeth, promethazine, Resource ThickenUp Clear, sodium chloride flush, sodium chloride flush    Assessment/Plan   1. Acute systolic HF -> Cardiogenic shock - post-op echo on 03/20/19 EF 25-30% (pre-op 30-35%. In 12/2017 EF normal) - Required Impella support post CABG. Impella removed 2/23. Post-op TEE EF 40% - volume status stable. Managed with iHD - tolerating iHD. MWF Schedule   2. CAD s/p CABG this admit (LIMA->LAD, SVG->OM, SVG->RCA on 03/15/19) - No s/s ischemia - Continue ASA +statin  3. Acute hypoxic respiratory failure with Pseudomonas PNA - s/p trach on 03/27/19 - completed meropenem for pseudomonas PNA - trach aspirate 3/5  w/ regrowth of Pseudomonas again.   - Completed antibiotic course.  -Trach decannulated 3/13. Sats stable on 4 liters [place].      4. PAF - s/p MAZE and LAA occlusion - Maintaining NSR.  - Continue IV amiodarone. Monitor for recurrent pauses/bradycardia  - Has not been on anticoagulation with hematuria. Will need to reconsider soon.  - Continue w/ SCDs   5. AKI  - due to shock/ATN - On CVVHD.  - Tunneled cath placed 3/9  - He is tolerating iHD  6. Hematuria -Ongoing.  Remains on CBI. -Cystoscopy 3/15 with fulgeration - Received 1UPRBC 05/02/19 HGb  6.5>8.5>8.4 - off anticoagulation.   7. Debility, severe - continue PT/OT  8. F/E/N - Tube feeds per Cor-trak.     9. Left  Pleural Effusion  - moderate to large. - s/p PleurX on 3/8.  - TCTS managing   10. Diffuse rash - Appearance of drug induced rash. ? Related to PO amiodarone - PO amiodarone discontinued. Currently on IV for now.  - Continue to monitor - Continue hydroxyzine for pruritis   11. Anemia: - h/o melena and hematuria this admit. Hgb 8.4 today. Fe low 34. - transfuse if < 7 - feraheme ordered - continue aransep q weekly    Lyda Jester, PA-C  9:55 AM   Patient seen and examined with the above-signed Advanced Practice Provider and/or Housestaff. I personally reviewed laboratory data, imaging studies and relevant notes. I independently examined the patient and formulated the important aspects of the plan. I have edited the note to reflect any of my changes or salient points. I have personally discussed the plan with the patient and/or family.  Tolerated HD today without problem but multiple other issues. Had 7 second pause overnight.   Feels more SOB. Also feels nauseated/bloated with poor appetite. TCTS started Reglan. Pleurex drained today. Worsening maculopapular rash. Went through all of his meds with PharmD team and no clear culprit.   With high grade pause will stop amio and have pulled back all  non-essential meds. Follow rash closely. Still with hematuria and undergoing CBI so no AC possible.   Continue aggressive PT/OT.   Glori Bickers, MD  4:51 PM

## 2019-05-05 ENCOUNTER — Inpatient Hospital Stay (HOSPITAL_COMMUNITY): Payer: Medicare Other

## 2019-05-05 DIAGNOSIS — N186 End stage renal disease: Secondary | ICD-10-CM

## 2019-05-05 LAB — RENAL FUNCTION PANEL
Albumin: 2.3 g/dL — ABNORMAL LOW (ref 3.5–5.0)
Anion gap: 10 (ref 5–15)
BUN: 71 mg/dL — ABNORMAL HIGH (ref 8–23)
CO2: 28 mmol/L (ref 22–32)
Calcium: 7.9 mg/dL — ABNORMAL LOW (ref 8.9–10.3)
Chloride: 95 mmol/L — ABNORMAL LOW (ref 98–111)
Creatinine, Ser: 3.79 mg/dL — ABNORMAL HIGH (ref 0.61–1.24)
GFR calc Af Amer: 15 mL/min — ABNORMAL LOW (ref >60)
GFR calc non Af Amer: 13 mL/min — ABNORMAL LOW (ref >60)
Glucose, Bld: 122 mg/dL — ABNORMAL HIGH (ref 70–99)
Phosphorus: 3 mg/dL (ref 2.5–4.6)
Potassium: 4.7 mmol/L (ref 3.5–5.1)
Sodium: 133 mmol/L — ABNORMAL LOW (ref 135–145)

## 2019-05-05 LAB — GLUCOSE, CAPILLARY
Glucose-Capillary: 109 mg/dL — ABNORMAL HIGH (ref 70–99)
Glucose-Capillary: 115 mg/dL — ABNORMAL HIGH (ref 70–99)
Glucose-Capillary: 119 mg/dL — ABNORMAL HIGH (ref 70–99)
Glucose-Capillary: 119 mg/dL — ABNORMAL HIGH (ref 70–99)
Glucose-Capillary: 126 mg/dL — ABNORMAL HIGH (ref 70–99)
Glucose-Capillary: 132 mg/dL — ABNORMAL HIGH (ref 70–99)
Glucose-Capillary: 58 mg/dL — ABNORMAL LOW (ref 70–99)

## 2019-05-05 LAB — MAGNESIUM: Magnesium: 2.1 mg/dL (ref 1.7–2.4)

## 2019-05-05 LAB — COOXEMETRY PANEL
Carboxyhemoglobin: 2 % — ABNORMAL HIGH (ref 0.5–1.5)
Methemoglobin: 1.5 % (ref 0.0–1.5)
O2 Saturation: 67.7 %
Total hemoglobin: 8.6 g/dL — ABNORMAL LOW (ref 12.0–16.0)

## 2019-05-05 LAB — CBC
HCT: 28.8 % — ABNORMAL LOW (ref 39.0–52.0)
Hemoglobin: 8.7 g/dL — ABNORMAL LOW (ref 13.0–17.0)
MCH: 29.2 pg (ref 26.0–34.0)
MCHC: 30.2 g/dL (ref 30.0–36.0)
MCV: 96.6 fL (ref 80.0–100.0)
Platelets: 187 10*3/uL (ref 150–400)
RBC: 2.98 MIL/uL — ABNORMAL LOW (ref 4.22–5.81)
RDW: 16.4 % — ABNORMAL HIGH (ref 11.5–15.5)
WBC: 13.2 10*3/uL — ABNORMAL HIGH (ref 4.0–10.5)
nRBC: 0.2 % (ref 0.0–0.2)

## 2019-05-05 LAB — OCCULT BLOOD X 1 CARD TO LAB, STOOL: Fecal Occult Bld: POSITIVE — AB

## 2019-05-05 MED ORDER — DEXTROSE 50 % IV SOLN
12.5000 g | INTRAVENOUS | Status: AC
  Administered 2019-05-05: 12.5 g via INTRAVENOUS

## 2019-05-05 MED ORDER — ALTEPLASE 2 MG IJ SOLR
2.0000 mg | Freq: Once | INTRAMUSCULAR | Status: AC
  Administered 2019-05-05: 2 mg

## 2019-05-05 MED ORDER — FAMOTIDINE 20 MG PO TABS
10.0000 mg | ORAL_TABLET | Freq: Every day | ORAL | Status: DC
  Administered 2019-05-05 – 2019-05-22 (×18): 10 mg
  Filled 2019-05-05 (×18): qty 1

## 2019-05-05 NOTE — Progress Notes (Signed)
At 1800 patient complained of urge to urinate and flow to drainage bag had stopped. RN paged urology then irrigated catheter with 50 ml sterile water. Small clots observed, flow returned and patient reported relief.

## 2019-05-05 NOTE — Progress Notes (Signed)
VASCULAR LAB PRELIMINARY  PRELIMINARY  PRELIMINARY  PRELIMINARY  Upper extremity venous vein mapping completed.    Preliminary report:  See CV proc for preliminary results.   Printice Hellmer, RVT 05/05/2019, 10:43 AM

## 2019-05-05 NOTE — Progress Notes (Signed)
[facility] Progress Note    Assessment/ Plan:   1. AKI(Cr was 0.7 prior to surgery)- due to postoperative CHF/cardiogenic - started on CRRT  03/29/19 - 2/24. Attempted IHD on 2/26 with minimal UF --> increased pressors and rapid afib.  Then CRRT on 3/1-- 3/8. Had been ontoleratingnet UF with  CRRT.   BUN and crt rising off CRRT which indicates he will remain HD dep. Tolerating IHD.  MWF schedule.  Will vein map for consideration of perm access--> ordered.  Next HD not planned until Monday   2. Acute systolic CHF/cardiogenic shock- EF 25-30%; s/p Impella- now out. optimize volume as above. On midodrine  15 mg TID.  off pressors- still overloaded, trying to chip away at volume   3. CAD s/p [id number] 7/32/20 complicated by cardiogenic shock and ARF.Postop echo EF 25-30  4. Acute hypoxic respiratory failure with pseudomonas pneumonia- s/p trach on 03/27/19.s/pabx per PCCM-> completed abx  5. 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  6. ABLA- Hx Melen and hematuria. Transfuse as needed.Increase aranesp to 200 q week.  % sat 11, feraheme x 1 today and then can get the rest of his iron load with HD  7. Protein malnutrition- moderate to severe- alb 2.0  8. Secondary hyperparathyroidism/bone mineral -  phos rising,s/p phoslo -.  pth 116  9. Rash: looks like drug reaction--> emollients/ hydroxyzine prn, ? Abx, amio  10. Disposition-  trach is out--> this was a huge step re: outpatient HD candidacy.  Will see how he does throughout week and if continues to improve will consider perm access (Ie AVF/AVG)--> only has L IJ TDC for now  Subjective:    Vein mapping ordered, had HD yesterday, did well.     Objective:   BP (!) 97/48   Pulse 72   Temp 98.7 F (37.1 C) (Oral)   Resp 17   Ht 5\' 8"  (1.727 m)   Wt 86.5 kg   SpO2 96%   BMI 29.00 kg/m   Intake/Output Summary (Last 24 hours)  at 05/05/2019 2542 Last data filed at 05/05/2019 0700 Gross per 24 hour  Intake 4166.05 ml  Output 8838 ml  Net -4671.95 ml   Weight change: 5.2 kg  Physical Exam: Gen: older gentleman, NAD NECK: trach site dressed CVS: RRR Resp: clear anteriorly, bases diminished Abd: soft Ext: 1+ dependent and flank edema, improving, lacy rash/ erythematous, itchy over bilateral legs, some on stomach ACCESS: L IJ TDC  Imaging: No results found.  Labs: BMET Recent Labs  Lab 04/30/19 0337 04/30/19 1441 05/01/19 0439 05/02/19 0425 05/03/19 0441 05/04/19 0429 05/05/19 0427  NA 132* 132* 137 137 138 133* 133*  K 5.4* 5.4* 4.7 5.2* 4.8 5.2* 4.7  CL 91* 93* 98 98 98 93* 95*  CO2 27 24 28 25 29 26 28   GLUCOSE 101* 141* 138* 94 125* 156* 122*  BUN 94* 98* 52* 79* 50* 88* 71*  CREATININE 5.45* 5.83* 3.73* 5.04* 3.83* 4.95* 3.79*  CALCIUM 7.9* 7.8* 7.8* 8.2* 8.4* 7.9* 7.9*  PHOS 7.9* 8.5* 4.8* 5.7* 4.7* 4.5 3.0   CBC Recent Labs  Lab 04/30/19 1441 05/02/19 0657 05/02/19 2030 05/04/19 0615  WBC 17.6* 10.0 9.2 10.9*  HGB 8.5* 6.5* 8.5* 8.4*  HCT 26.7* 21.4* 26.8* 27.6*  MCV 93.0 96.0 92.7 93.6  PLT 203 182 142* 190    Medications:    . aspirin  81 mg Per Tube Daily  .  atorvastatin  80 mg Per Tube q1800  . calcium acetate (Phos Binder)  667 mg Per Tube TID WC  . chlorhexidine  15 mL Mouth Rinse BID  . darbepoetin (ARANESP) injection - DIALYSIS  200 mcg Intravenous Q Wed-HD  . feeding supplement (PRO-STAT SUGAR FREE 64)  60 mL Per Tube BID  . Gerhardt's butt cream   Topical BID  . hydrocortisone   Rectal BID  . hydrOXYzine  10 mg Per Tube Q6H  . insulin aspart  0-24 Units Subcutaneous Q4H  . insulin glargine  36 Units Subcutaneous Daily  . lipase/protease/amylase)  20,880 Units Per Tube Once   And  . sodium bicarbonate  650 mg Per Tube Once  . mouth rinse  15 mL Mouth Rinse q12n4p  . metoCLOPramide (REGLAN) injection  10 mg Intravenous Q6H  . midodrine  15 mg Per Tube TID WC   . multivitamin  1 tablet Per Tube QHS  . nutrition supplement (JUVEN)  1 packet Per Tube BID BM  . simethicone  80 mg Per Tube BID PC  . sodium chloride flush  10-40 mL Intracatheter Q12H      Madelon Lips, MD 05/05/2019, 8:32 AM

## 2019-05-05 NOTE — Progress Notes (Signed)
Patient had bowel movement at 1730 that appeared red with possible frank blood. On inspection blood did not appear to originate from the buttocks or anus. Oncoming RN notified, hemoccult ordered.

## 2019-05-05 NOTE — Progress Notes (Signed)
      [address]       [place] [address]             [phone]                 5 Days Post-Op Procedure(s) (LRB): CYSTOSCOPY WITH FULGERATION (N/A)   Events: No events.  Tolerated dialysis yesterday _______________________________________________________________ Vitals: BP (!) 97/48   Pulse 72   Temp 98.2 F (36.8 C) (Oral)   Resp 17   Ht 5\' 8"  (1.727 m)   Wt 86.5 kg   SpO2 96%   BMI 29.00 kg/m   - Neuro: alert NAD  - Cardiovascular: sinus  Drips: none.   CVP:  [0 mmHg-59 mmHg] 9 mmHg  - Pulm: EWOB    ABG    Component Value Date/Time   PHART 7.488 (H) 04/07/2019 0405   PCO2ART 34.4 04/07/2019 0405   PO2ART 101.0 04/07/2019 0405   HCO3 25.9 04/07/2019 0405   TCO2 36 (H) 04/23/2019 0449   ACIDBASEDEF 1.0 04/05/2019 0540   O2SAT 67.7 05/05/2019 0401      .Intake/Output      03/19 0701 - 03/20 0700 03/20 0701 - 03/21 0700   I.V. (mL/kg) 171.3 (2)    Other 3000    NG/GT 930    IV Piggyback 138.1    Total Intake(mL/kg) 4239.3 (49)    Urine (mL/kg/hr) 6850 (3.3)    Other 1978    Stool 0    Chest Tube 10    Total Output 8838    Net -4598.7         Stool Occurrence 3 x       _______________________________________________________________ Labs: CBC Latest Ref Rng & Units 05/04/2019 05/02/2019 05/02/2019  WBC 4.0 - 10.5 K/uL 10.9(H) 9.2 10.0  Hemoglobin 13.0 - 17.0 g/dL 8.4(L) 8.5(L) 6.5(LL)  Hematocrit 39.0 - 52.0 % 27.6(L) 26.8(L) 21.4(L)  Platelets 150 - 400 K/uL 190 142(L) 182   CMP Latest Ref Rng & Units 05/05/2019 05/04/2019 05/03/2019  Glucose 70 - 99 mg/dL 122(H) 156(H) 125(H)  BUN 8 - 23 mg/dL 71(H) 88(H) 50(H)  Creatinine 0.61 - 1.24 mg/dL 3.79(H) 4.95(H) 3.83(H)  Sodium 135 - 145 mmol/L 133(L) 133(L) 138  Potassium 3.5 - 5.1 mmol/L 4.7 5.2(H) 4.8  Chloride 98 - 111 mmol/L 95(L) 93(L) 98  CO2 22 - 32 mmol/L 28 26 29   Calcium 8.9 - 10.3 mg/dL 7.9(L) 7.9(L) 8.4(L)  Total Protein 6.5 - 8.1 g/dL - - -  Total Bilirubin  0.3 - 1.2 mg/dL - - -  Alkaline Phos 38 - 126 U/L - - -  AST 15 - 41 U/L - - -  ALT 0 - 44 U/L - - -    CXR: Stable effusion  _______________________________________________________________  Assessment and Plan: 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  Removal on 2/23 Bladder avm, on continuous irrigation  Neuro: pain controlled CV: stable Pulm: off vent.  Doing well Renal: HD.  Receiving bladder irrigation GI: on diet Heme: stable ID: [email or web address] Endo: SSI  Dispo: continue ICU care for bladder irrigation  Melodie Bouillon, MD 05/05/2019 10:17 AM

## 2019-05-05 NOTE — Progress Notes (Signed)
5 Days Post-Op Subjective: Patient reports a few spasms as well as urine squirting around his catheter.  Objective: Vital signs in last 24 hours: Temp:  [97.8 F (36.6 C)-99.3 F (37.4 C)] 98.7 F (37.1 C) (03/19 2300) Pulse Rate:  [64-74] 72 (03/20 0700) Resp:  [12-31] 17 (03/20 0700) BP: (97-129)/(31-66) 97/48 (03/20 0700) SpO2:  [89 %-100 %] 96 % (03/20 0700) Weight:  [86.5 kg] 86.5 kg (03/19 1145)  Intake/Output from previous day: 03/19 0701 - 03/20 0700 In: 4239.3 [I.V.:171.3; NG/GT:930; IV Piggyback:138.1] Out: 9242 [Urine:6850; Chest Tube:10] Intake/Output this shift: No intake/output data recorded.  Physical Exam:  Constitutional: Vital signs reviewed. WD WN in NAD   Eyes: PERRL, No scleral icterus.   Pulmonary/Chest: Normal effort Extremities: No cyanosis or edema   Irrigant/effluent fairly red, without clots.  I then irrigated with 1 L of saline.  A fair amount of clot was liberated.  All clot was irrigated from the bladder.  He was hooked back up to CBI. Lab Results: Recent Labs    05/02/19 2030 05/04/19 0615  HGB 8.5* 8.4*  HCT 26.8* 27.6*   BMET Recent Labs    05/04/19 0429 05/05/19 0427  NA 133* 133*  K 5.2* 4.7  CL 93* 95*  CO2 26 28  GLUCOSE 156* 122*  BUN 88* 71*  CREATININE 4.95* 3.79*  CALCIUM 7.9* 7.9*   No results for input(s): LABPT, INR in the last 72 hours. No results for input(s): LABURIN in the last 72 hours. Results for orders placed or performed during the hospital encounter of 03/13/19  Respiratory Panel by RT PCR (Flu A&B, Covid) - Nasopharyngeal Swab     Status: None   Collection Time: 03/13/19  1:49 PM   Specimen: Nasopharyngeal Swab  Result Value Ref Range Status   SARS Coronavirus 2 by RT PCR NEGATIVE NEGATIVE Final    Comment: (NOTE) SARS-CoV-2 target nucleic acids are NOT DETECTED. The SARS-CoV-2 RNA is generally detectable in upper respiratoy specimens during the acute phase of infection. The lowest concentration  of SARS-CoV-2 viral copies this assay can detect is 131 copies/mL. A negative result does not preclude SARS-Cov-2 infection and should not be used as the sole basis for treatment or other patient management decisions. A negative result may occur with  improper specimen collection/handling, submission of specimen other than nasopharyngeal swab, presence of viral mutation(s) within the areas targeted by this assay, and inadequate number of viral copies (<131 copies/mL). A negative result must be combined with clinical observations, patient history, and epidemiological information. The expected result is Negative. Fact Sheet for Patients:  PinkCheek.be Fact Sheet for Healthcare Providers:  GravelBags.it This test is not yet ap proved or cleared by the [place] FDA and  has been authorized for detection and/or diagnosis of SARS-CoV-2 by FDA under an Emergency Use Authorization (EUA). This EUA will remain  in effect (meaning this test can be used) for the duration of the COVID-19 declaration under Section 564(b)(1) of the Act, 21 U.S.C. section 360bbb-3(b)(1), unless the authorization is terminated or revoked sooner.    Influenza A by PCR NEGATIVE NEGATIVE Final   Influenza B by PCR NEGATIVE NEGATIVE Final    Comment: (NOTE) The Xpert Xpress SARS-CoV-2/FLU/RSV assay is intended as an aid in  the diagnosis of influenza from Nasopharyngeal swab specimens and  should not be used as a sole basis for treatment. Nasal washings and  aspirates are unacceptable for Xpert Xpress SARS-CoV-2/FLU/RSV  testing. Fact Sheet for Patients: PinkCheek.be Fact Sheet  for Healthcare Providers: GravelBags.it This test is not yet approved or cleared by the [place] and  has been authorized for detection and/or diagnosis of SARS-CoV-2 by  FDA under an Emergency Use Authorization (EUA).  This EUA will remain  in effect (meaning this test can be used) for the duration of the  Covid-19 declaration under Section 564(b)(1) of the Act, 21  U.S.C. section 360bbb-3(b)(1), unless the authorization is  terminated or revoked. Performed at [facility], [address] [place], [place] [address]   MRSA PCR Screening     Status: None   Collection Time: 03/13/19  5:18 PM   Specimen: Nasal Mucosa; Nasopharyngeal  Result Value Ref Range Status   MRSA by PCR NEGATIVE NEGATIVE Final    Comment:        The GeneXpert MRSA Assay (FDA approved for NASAL specimens only), is one component of a comprehensive MRSA colonization surveillance program. It is not intended to diagnose MRSA infection nor to guide or monitor treatment for MRSA infections. Performed at [facility], [address], [place], [place] [address]   Surgical pcr screen     Status: None   Collection Time: 03/15/19  9:28 PM   Specimen: Nasal Mucosa; Nasal Swab  Result Value Ref Range Status   MRSA, PCR NEGATIVE NEGATIVE Final   Staphylococcus aureus NEGATIVE NEGATIVE Final    Comment: (NOTE) The Xpert SA Assay (FDA approved for NASAL specimens in patients 79 years of age and older), is one component of a comprehensive surveillance program. It is not intended to diagnose infection nor to guide or monitor treatment. Performed at [facility], [place] [address], [place], [place] [address]   Culture, respiratory (non-expectorated)     Status: None   Collection Time: 03/19/19  3:00 PM   Specimen: Tracheal Aspirate; Respiratory  Result Value Ref Range Status   Specimen Description TRACHEAL ASPIRATE  Final   Special Requests Normal  Final   Gram Stain   Final    ABUNDANT WBC PRESENT,BOTH PMN AND MONONUCLEAR FEW GRAM POSITIVE COCCI RARE GRAM NEGATIVE RODS    Culture   Final    FEW Consistent with normal respiratory flora. Performed at [facility], [place] [address] [place], [place] [address]    Report  Status 03/21/2019 FINAL  Final  Culture, respiratory (non-expectorated)     Status: None   Collection Time: 03/26/19  6:29 AM   Specimen: Tracheal Aspirate; Respiratory  Result Value Ref Range Status   Specimen Description TRACHEAL ASPIRATE  Final   Special Requests NONE  Final   Gram Stain   Final    ABUNDANT WBC PRESENT,BOTH PMN AND MONONUCLEAR NO ORGANISMS SEEN Performed at [facility], [address], [place], [place] [address]    Culture FEW PSEUDOMONAS AERUGINOSA  Final   Report Status 03/28/2019 FINAL  Final   Organism ID, Bacteria PSEUDOMONAS AERUGINOSA  Final      Susceptibility   Pseudomonas aeruginosa - MIC*    CEFTAZIDIME 8 SENSITIVE Sensitive     CIPROFLOXACIN <=0.25 SENSITIVE Sensitive     GENTAMICIN 4 SENSITIVE Sensitive     IMIPENEM 2 SENSITIVE Sensitive     * FEW PSEUDOMONAS AERUGINOSA  Culture, respiratory (non-expectorated)     Status: None   Collection Time: 04/03/19 11:08 AM   Specimen: Tracheal Aspirate; Respiratory  Result Value Ref Range Status   Specimen Description TRACHEAL ASPIRATE  Final   Special Requests NONE  Final   Gram Stain  Final    FEW WBC PRESENT, PREDOMINANTLY PMN FEW GRAM NEGATIVE RODS RARE YEAST    Culture   Final    FEW PSEUDOMONAS AERUGINOSA FEW CANDIDA TROPICALIS    Report Status 04/06/2019 FINAL  Final   Organism ID, Bacteria PSEUDOMONAS AERUGINOSA  Final      Susceptibility   Pseudomonas aeruginosa - MIC*    CEFTAZIDIME 32 RESISTANT Resistant     CIPROFLOXACIN <=0.25 SENSITIVE Sensitive     GENTAMICIN 4 SENSITIVE Sensitive     IMIPENEM Value in next row Sensitive      2 SENSITIVEPerformed at [facility], [address] [place], [place] [address]    * FEW PSEUDOMONAS AERUGINOSA  Culture, blood (Routine X 2) w Reflex to ID Panel     Status: None   Collection Time: 04/03/19 12:30 PM   Specimen: BLOOD LEFT HAND  Result Value Ref Range Status   Specimen Description BLOOD LEFT HAND  Final   Special Requests    Final    BOTTLES DRAWN AEROBIC ONLY Blood Culture results may not be optimal due to an inadequate volume of blood received in culture bottles   Culture   Final    NO GROWTH 5 DAYS Performed at [facility], [place] [address], [place], [place] [address]    Report Status 04/08/2019 FINAL  Final  Culture, blood (Routine X 2) w Reflex to ID Panel     Status: None   Collection Time: 04/03/19 12:35 PM   Specimen: BLOOD LEFT HAND  Result Value Ref Range Status   Specimen Description BLOOD LEFT HAND  Final   Special Requests   Final    BOTTLES DRAWN AEROBIC ONLY Blood Culture results may not be optimal due to an inadequate volume of blood received in culture bottles   Culture   Final    NO GROWTH 5 DAYS Performed at [facility], [place] [address], [place], [place] [address]    Report Status 04/08/2019 FINAL  Final  Culture, respiratory (non-expectorated)     Status: None   Collection Time: 04/07/19 11:10 AM   Specimen: Tracheal Aspirate; Respiratory  Result Value Ref Range Status   Specimen Description TRACHEAL ASPIRATE  Final   Special Requests NONE  Final   Gram Stain   Final    MODERATE WBC PRESENT, PREDOMINANTLY PMN ABUNDANT GRAM NEGATIVE RODS Performed at [facility], [place] [address], [place], [place] [address]    Culture ABUNDANT PSEUDOMONAS AERUGINOSA  Final   Report Status 04/09/2019 FINAL  Final   Organism ID, Bacteria PSEUDOMONAS AERUGINOSA  Final      Susceptibility   Pseudomonas aeruginosa - MIC*    CEFTAZIDIME 16 INTERMEDIATE Intermediate     CIPROFLOXACIN <=0.25 SENSITIVE Sensitive     GENTAMICIN 4 SENSITIVE Sensitive     IMIPENEM 1 SENSITIVE Sensitive     * ABUNDANT PSEUDOMONAS AERUGINOSA  Culture, blood (routine x 2)     Status: None   Collection Time: 04/07/19  2:07 PM   Specimen: BLOOD LEFT HAND  Result Value Ref Range Status   Specimen Description BLOOD LEFT HAND  Final   Special Requests   Final    BOTTLES DRAWN AEROBIC ONLY Blood Culture  adequate volume Performed at [facility], [place] [address], [place],  [address]    Culture NO GROWTH 5 DAYS  Final   Report Status 04/12/2019 FINAL  Final  Culture, blood (routine x 2)     Status:  None   Collection Time: 04/07/19  2:07 PM   Specimen: BLOOD LEFT HAND  Result Value Ref Range Status   Specimen Description BLOOD LEFT HAND  Final   Special Requests   Final    BOTTLES DRAWN AEROBIC ONLY Blood Culture adequate volume Performed at [facility], [address], [place], [place] [address]    Culture NO GROWTH 5 DAYS  Final   Report Status 04/12/2019 FINAL  Final  Culture, Urine     Status: None   Collection Time: 04/13/19  6:58 PM   Specimen: Urine, Catheterized  Result Value Ref Range Status   Specimen Description URINE, CATHETERIZED  Final   Special Requests NONE  Final   Culture   Final    NO GROWTH Performed at [facility], [address] [place], [place] [address]    Report Status 04/14/2019 FINAL  Final  MRSA PCR Screening     Status: None   Collection Time: 04/15/19 12:53 PM   Specimen: Nasal Mucosa; Nasopharyngeal  Result Value Ref Range Status   MRSA by PCR NEGATIVE NEGATIVE Final    Comment:        The GeneXpert MRSA Assay (FDA approved for NASAL specimens only), is one component of a comprehensive MRSA colonization surveillance program. It is not intended to diagnose MRSA infection nor to guide or monitor treatment for MRSA infections. Performed at [facility], [place] [address] [place], [place] [address]   Culture, respiratory (non-expectorated)     Status: None   Collection Time: 04/20/19 11:35 AM   Specimen: Tracheal Aspirate; Respiratory  Result Value Ref Range Status   Specimen Description TRACHEAL ASPIRATE  Final   Special Requests Normal  Final   Gram Stain   Final    RARE WBC PRESENT, PREDOMINANTLY PMN FEW GRAM NEGATIVE RODS FEW GRAM POSITIVE RODS Performed at [facility], [place] [address]  [place], [place] [address]    Culture MODERATE PSEUDOMONAS AERUGINOSA  Final   Report Status 04/23/2019 FINAL  Final   Organism ID, Bacteria PSEUDOMONAS AERUGINOSA  Final      Susceptibility   Pseudomonas aeruginosa - MIC*    CEFTAZIDIME >=64 RESISTANT Resistant     CIPROFLOXACIN <=0.25 SENSITIVE Sensitive     GENTAMICIN 4 SENSITIVE Sensitive     IMIPENEM >=16 RESISTANT Resistant     * MODERATE PSEUDOMONAS AERUGINOSA  Body fluid culture     Status: None   Collection Time: 04/23/19 12:02 PM   Specimen: Pleural, Left; Body Fluid  Result Value Ref Range Status   Specimen Description PLEURAL LEFT  Final   Special Requests NONE  Final   Gram Stain   Final    ABUNDANT WBC PRESENT,BOTH PMN AND MONONUCLEAR NO ORGANISMS SEEN    Culture   Final    NO GROWTH 3 DAYS Performed at [facility], [address], [place], [place] [address]    Report Status 04/26/2019 FINAL  Final    Studies/Results: No results found.  Assessment/Plan:   Gross hematuria, still on CBI.  Small amount of clot irrigated from the bladder today.  Hopefully, this will slow down his bleeding/need for CBI.  For now, CBI should be continued.  If he continues with hematuria, consider alum irrigation.  I will check on tomorrow.   LOS: 52 days   Jorja Loa 05/05/2019, 7:32 AM

## 2019-05-06 LAB — CBC
HCT: 27.7 % — ABNORMAL LOW (ref 39.0–52.0)
Hemoglobin: 8.4 g/dL — ABNORMAL LOW (ref 13.0–17.0)
MCH: 29.6 pg (ref 26.0–34.0)
MCHC: 30.3 g/dL (ref 30.0–36.0)
MCV: 97.5 fL (ref 80.0–100.0)
Platelets: 186 10*3/uL (ref 150–400)
RBC: 2.84 MIL/uL — ABNORMAL LOW (ref 4.22–5.81)
RDW: 16.4 % — ABNORMAL HIGH (ref 11.5–15.5)
WBC: 12.3 10*3/uL — ABNORMAL HIGH (ref 4.0–10.5)
nRBC: 0 % (ref 0.0–0.2)

## 2019-05-06 LAB — GLUCOSE, CAPILLARY
Glucose-Capillary: 101 mg/dL — ABNORMAL HIGH (ref 70–99)
Glucose-Capillary: 129 mg/dL — ABNORMAL HIGH (ref 70–99)
Glucose-Capillary: 157 mg/dL — ABNORMAL HIGH (ref 70–99)
Glucose-Capillary: 173 mg/dL — ABNORMAL HIGH (ref 70–99)
Glucose-Capillary: 180 mg/dL — ABNORMAL HIGH (ref 70–99)
Glucose-Capillary: 203 mg/dL — ABNORMAL HIGH (ref 70–99)

## 2019-05-06 LAB — COMPREHENSIVE METABOLIC PANEL
ALT: 7 U/L (ref 0–44)
AST: 14 U/L — ABNORMAL LOW (ref 15–41)
Albumin: 2.3 g/dL — ABNORMAL LOW (ref 3.5–5.0)
Alkaline Phosphatase: 74 U/L (ref 38–126)
Anion gap: 10 (ref 5–15)
BUN: 98 mg/dL — ABNORMAL HIGH (ref 8–23)
CO2: 27 mmol/L (ref 22–32)
Calcium: 7.9 mg/dL — ABNORMAL LOW (ref 8.9–10.3)
Chloride: 97 mmol/L — ABNORMAL LOW (ref 98–111)
Creatinine, Ser: 4.96 mg/dL — ABNORMAL HIGH (ref 0.61–1.24)
GFR calc Af Amer: 11 mL/min — ABNORMAL LOW (ref >60)
GFR calc non Af Amer: 10 mL/min — ABNORMAL LOW (ref >60)
Glucose, Bld: 112 mg/dL — ABNORMAL HIGH (ref 70–99)
Potassium: 5.1 mmol/L (ref 3.5–5.1)
Sodium: 134 mmol/L — ABNORMAL LOW (ref 135–145)
Total Bilirubin: 0.5 mg/dL (ref 0.3–1.2)
Total Protein: 5.4 g/dL — ABNORMAL LOW (ref 6.5–8.1)

## 2019-05-06 LAB — HEMOGLOBIN AND HEMATOCRIT, BLOOD
HCT: 28.2 % — ABNORMAL LOW (ref 39.0–52.0)
Hemoglobin: 8.5 g/dL — ABNORMAL LOW (ref 13.0–17.0)

## 2019-05-06 LAB — MAGNESIUM: Magnesium: 2.2 mg/dL (ref 1.7–2.4)

## 2019-05-06 LAB — COOXEMETRY PANEL
Carboxyhemoglobin: 1.9 % — ABNORMAL HIGH (ref 0.5–1.5)
Methemoglobin: 1.3 % (ref 0.0–1.5)
O2 Saturation: 72.4 %
Total hemoglobin: 8.3 g/dL — ABNORMAL LOW (ref 12.0–16.0)

## 2019-05-06 LAB — PHOSPHORUS: Phosphorus: 3.5 mg/dL (ref 2.5–4.6)

## 2019-05-06 LAB — OCCULT BLOOD X 1 CARD TO LAB, STOOL: Fecal Occult Bld: POSITIVE — AB

## 2019-05-06 MED ORDER — METHYLPREDNISOLONE SODIUM SUCC 125 MG IJ SOLR
80.0000 mg | Freq: Every day | INTRAMUSCULAR | Status: DC
  Administered 2019-05-06 – 2019-05-10 (×5): 80 mg via INTRAVENOUS
  Filled 2019-05-06 (×5): qty 2

## 2019-05-06 MED ORDER — SODIUM CHLORIDE 0.9 % IV SOLN
125.0000 mg | INTRAVENOUS | Status: AC
  Administered 2019-05-07 – 2019-05-14 (×4): 125 mg via INTRAVENOUS
  Filled 2019-05-06 (×4): qty 10

## 2019-05-06 MED ORDER — SODIUM CHLORIDE 0.9 % IR SOLN
Status: AC
  Filled 2019-05-06 (×7): qty 30

## 2019-05-06 NOTE — Progress Notes (Signed)
[facility] Progress Note    Assessment/ Plan:   1. AKI(Cr was 0.7 prior to surgery)- due to postoperative CHF/cardiogenic - started on CRRT  03/29/19 - 2/24. Attempted IHD on 2/26 with minimal UF --> increased pressors and rapid afib.  Then CRRT on 3/1-- 3/8 then switched back to IHD which he is tolerating. Tolerating IHD.  MWF schedule.  Will vein map for consideration of perm access--> ordered.  Next HD Monday.  Has been getting 2.5L off per rx.  No heparin in rx d/t hematuria.  2K bath.     2. Acute systolic CHF/cardiogenic shock- EF 25-30%; s/p Impella- now out. optimize volume as above. On midodrine  15 mg TID.  off pressors- still overloaded, trying to chip away at volume   3. CAD s/p [id number] 2/77/41 complicated by cardiogenic shock and ARF.Postop echo EF 25-30  4. Acute hypoxic respiratory failure with pseudomonas pneumonia- s/p trach on 03/27/19.s/pabx per PCCM-> completed abx.  Decannulated  5. 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  Getting better  6. ABLA- Hx Melen and hematuria. Transfuse as needed.Increase aranesp to 200 q week.  % sat 11, feraheme x 1 and then can get the rest of his iron load with HD--> ordered  7. Protein malnutrition- moderate to severe- alb 2.0  8. Secondary hyperparathyroidism/bone mineral -  phos rising,s/p phoslo -.  pth 116  9. Rash: looks like drug reaction--> emollients/ hydroxyzine prn, ? Abx, amio  10.  Bloody bowel movement- FOBT pending, per primary  11.  Disposition-  trach is out--> this was a huge step re: outpatient HD candidacy.  Will see how he does throughout week and if continues to improve will consider perm access (Ie AVF/AVG)--> only has L IJ TDC for now  Subjective:    For HD tomorrow.  Sitting up in chair,  Had a blood BM yesterday.  FOBT ordered per documentation, Hgb 8.4 this AM (relatively stable)   Objective:    BP (!) 132/48   Pulse 78   Temp 97.8 F (36.6 C) (Oral)   Resp (!) 25   Ht 5\' 8"  (1.727 m)   Wt 84.4 kg   SpO2 96%   BMI 28.29 kg/m   Intake/Output Summary (Last 24 hours) at 05/06/2019 1005 Last data filed at 05/06/2019 0800 Gross per 24 hour  Intake 12825 ml  Output 4575 ml  Net 8250 ml   Weight change: -4.5 kg  Physical Exam: Gen: older gentleman, NAD NECK: trach site dressed CVS: RRR Resp: clear anteriorly, bases diminished Abd: soft Ext: 1+ dependent and flank edema, improving, lacy rash/ erythematous, itchy over bilateral legs, some on stomach, improving [facility]  Imaging: DG Chest Port 1 View  Result Date: 05/05/2019 CLINICAL DATA:  Tachycardia. EXAM: PORTABLE CHEST 1 VIEW COMPARISON:  May 03, 2019 FINDINGS: Stable left central line. Feeding tube terminates below today's film. The right PICC line terminates in the central SVC. Stable left chest tube. Probable small layering effusions, improved in the interval. No overt edema. Probable atelectasis underlying the effusions. No other changes. IMPRESSION: 1. Small layering effusions with underlying atelectasis. 2. Support apparatus as above. 3. No other acute abnormalities. Electronically Signed   By: Dorise Bullion III M.D   On: 05/05/2019 09:31   VAS [place] UPPER EXT VEIN MAPPING (PRE-OP AVF)  Result Date: 05/05/2019 UPPER EXTREMITY VEIN MAPPING  Indications: Pre-dialysis access. History: ESRD.  Limitations: PICC line in right arm  Comparison Study: No prior study Performing Technologist: Sharion Dove RVS  Examination Guidelines: A complete evaluation includes B-mode imaging, spectral Doppler, color Doppler, and power Doppler as needed of all accessible portions of each vessel. Bilateral testing is considered an integral part of a complete examination. Limited examinations for reoccurring indications may be performed as noted. +-----------------+-------------+----------+---------+ Right Cephalic   Diameter  (cm)Depth (cm)Findings  +-----------------+-------------+----------+---------+ Prox upper arm       1.37        0.52             +-----------------+-------------+----------+---------+ Mid upper arm        0.15        0.65             +-----------------+-------------+----------+---------+ Dist upper arm       0.18        1.15   branching +-----------------+-------------+----------+---------+ Antecubital fossa    0.25        0.68             +-----------------+-------------+----------+---------+ Prox forearm         0.23        0.40   branching +-----------------+-------------+----------+---------+ Mid forearm          0.16        1.15             +-----------------+-------------+----------+---------+ Wrist                0.18        0.26             +-----------------+-------------+----------+---------+ +-----------------+-------------+----------+---------+ Right Basilic    Diameter (cm)Depth (cm)Findings  +-----------------+-------------+----------+---------+ Prox upper arm       0.51        2.49    origin   +-----------------+-------------+----------+---------+ Mid upper arm        0.40        1.92             +-----------------+-------------+----------+---------+ Dist upper arm       0.45        1.21   branching +-----------------+-------------+----------+---------+ Antecubital fossa    0.29        0.72             +-----------------+-------------+----------+---------+ Prox forearm         0.30        0.71   branching +-----------------+-------------+----------+---------+ Mid forearm          0.25        1.23             +-----------------+-------------+----------+---------+ Wrist                0.18        0.35             +-----------------+-------------+----------+---------+ +-----------------+-------------+----------+--------+ Left Cephalic    Diameter (cm)Depth (cm)Findings +-----------------+-------------+----------+--------+  Prox upper arm       0.24        0.62            +-----------------+-------------+----------+--------+ Mid upper arm        0.30        0.64            +-----------------+-------------+----------+--------+ Dist upper arm       0.34        0.58            +-----------------+-------------+----------+--------+ Antecubital fossa    0.35        0.32            +-----------------+-------------+----------+--------+  Prox forearm         0.23        1.36            +-----------------+-------------+----------+--------+ Mid forearm          0.36        0.57            +-----------------+-------------+----------+--------+ Wrist                0.29        0.51            +-----------------+-------------+----------+--------+ +-----------------+-------------+----------+-----------------------+ Left Basilic     Diameter (cm)Depth (cm)       Findings         +-----------------+-------------+----------+-----------------------+ Prox upper arm                          not visualized and line +-----------------+-------------+----------+-----------------------+ Mid upper arm                           not visualized and line +-----------------+-------------+----------+-----------------------+ Dist upper arm       0.30        0.77                           +-----------------+-------------+----------+-----------------------+ Antecubital fossa    0.37        0.41                           +-----------------+-------------+----------+-----------------------+ Prox forearm         0.39        0.59                           +-----------------+-------------+----------+-----------------------+ Mid forearm          0.22        0.81                           +-----------------+-------------+----------+-----------------------+ Wrist                0.20        0.98                           +-----------------+-------------+----------+-----------------------+ *See table(s) above  for measurements and observations.  Diagnosing physician:    Preliminary     Labs: BMET Recent Labs  Lab 04/30/19 1441 05/01/19 0439 05/02/19 0425 05/03/19 0441 05/04/19 0429 05/05/19 0427 05/06/19 0427  NA 132* 137 137 138 133* 133* 134*  K 5.4* 4.7 5.2* 4.8 5.2* 4.7 5.1  CL 93* 98 98 98 93* 95* 97*  CO2 24 28 25 29 26 28 27   GLUCOSE 141* 138* 94 125* 156* 122* 112*  BUN 98* 52* 79* 50* 88* 71* 98*  CREATININE 5.83* 3.73* 5.04* 3.83* 4.95* 3.79* 4.96*  CALCIUM 7.8* 7.8* 8.2* 8.4* 7.9* 7.9* 7.9*  PHOS 8.5* 4.8* 5.7* 4.7* 4.5 3.0 3.5   CBC Recent Labs  Lab 05/02/19 2030 05/04/19 0615 05/05/19 2001 05/06/19 0427  WBC 9.2 10.9* 13.2* 12.3*  HGB 8.5* 8.4* 8.7* 8.4*  HCT 26.8* 27.6* 28.8* 27.7*  MCV 92.7 93.6 96.6 97.5  PLT 142* 190 187 186    Medications:    . aspirin  81  mg Per Tube Daily  . atorvastatin  80 mg Per Tube q1800  . calcium acetate (Phos Binder)  667 mg Per Tube TID WC  . chlorhexidine  15 mL Mouth Rinse BID  . darbepoetin (ARANESP) injection - DIALYSIS  200 mcg Intravenous Q Wed-HD  . famotidine  10 mg Per Tube Daily  . feeding supplement (PRO-STAT SUGAR FREE 64)  60 mL Per Tube BID  . Gerhardt's butt cream   Topical BID  . hydrocortisone   Rectal BID  . hydrOXYzine  10 mg Per Tube Q6H  . insulin aspart  0-24 Units Subcutaneous Q4H  . insulin glargine  36 Units Subcutaneous Daily  . lipase/protease/amylase)  20,880 Units Per Tube Once   And  . sodium bicarbonate  650 mg Per Tube Once  . mouth rinse  15 mL Mouth Rinse q12n4p  . metoCLOPramide (REGLAN) injection  10 mg Intravenous Q6H  . midodrine  15 mg Per Tube TID WC  . multivitamin  1 tablet Per Tube QHS  . nutrition supplement (JUVEN)  1 packet Per Tube BID BM  . simethicone  80 mg Per Tube BID PC  . sodium chloride flush  10-40 mL Intracatheter Q12H      Madelon Lips, MD 05/06/2019, 10:05 AM

## 2019-05-06 NOTE — Progress Notes (Signed)
      [address]       [place] [address]             [phone]                 6 Days Post-Op Procedure(s) (LRB): CYSTOSCOPY WITH FULGERATION (N/A)   Events: No events.  Tolerated dialysis yesterday _______________________________________________________________ Vitals: BP (!) 132/48   Pulse 78   Temp 97.8 F (36.6 C) (Oral)   Resp (!) 25   Ht 5\' 8"  (1.727 m)   Wt 84.4 kg   SpO2 96%   BMI 28.29 kg/m   - Neuro: alert NAD  - Cardiovascular: sinus  Drips: none.   CVP:  [2 mmHg-31 mmHg] 2 mmHg  - Pulm: EWOB    ABG    Component Value Date/Time   PHART 7.488 (H) 04/07/2019 0405   PCO2ART 34.4 04/07/2019 0405   PO2ART 101.0 04/07/2019 0405   HCO3 25.9 04/07/2019 0405   TCO2 36 (H) 04/23/2019 0449   ACIDBASEDEF 1.0 04/05/2019 0540   O2SAT 72.4 05/06/2019 0418      .Intake/Output      03/20 0701 - 03/21 0700 03/21 0701 - 03/22 0700   P.O. 100    I.V. (mL/kg) 10 (0.1)    Other 9000 6000   NG/GT 880    IV Piggyback     Total Intake(mL/kg) 9990 (118.4) 6000 (71.1)   Urine (mL/kg/hr) 4575 (2.3)    Other     Stool 0    Chest Tube     Total Output 4575    Net +5415 +6000        Stool Occurrence 5 x       _______________________________________________________________ Labs: CBC Latest Ref Rng & Units 05/06/2019 05/05/2019 05/04/2019  WBC 4.0 - 10.5 K/uL 12.3(H) 13.2(H) 10.9(H)  Hemoglobin 13.0 - 17.0 g/dL 8.4(L) 8.7(L) 8.4(L)  Hematocrit 39.0 - 52.0 % 27.7(L) 28.8(L) 27.6(L)  Platelets 150 - 400 K/uL 186 187 190   CMP Latest Ref Rng & Units 05/06/2019 05/05/2019 05/04/2019  Glucose 70 - 99 mg/dL 112(H) 122(H) 156(H)  BUN 8 - 23 mg/dL 98(H) 71(H) 88(H)  Creatinine 0.61 - 1.24 mg/dL 4.96(H) 3.79(H) 4.95(H)  Sodium 135 - 145 mmol/L 134(L) 133(L) 133(L)  Potassium 3.5 - 5.1 mmol/L 5.1 4.7 5.2(H)  Chloride 98 - 111 mmol/L 97(L) 95(L) 93(L)  CO2 22 - 32 mmol/L 27 28 26   Calcium 8.9 - 10.3 mg/dL 7.9(L) 7.9(L) 7.9(L)  Total Protein 6.5 -  8.1 g/dL 5.4(L) - -  Total Bilirubin 0.3 - 1.2 mg/dL 0.5 - -  Alkaline Phos 38 - 126 U/L 74 - -  AST 15 - 41 U/L 14(L) - -  ALT 0 - 44 U/L 7 - -    CXR: Stable effusion  _______________________________________________________________  Assessment and Plan: 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  Removal on 2/23 Bladder avm, on continuous irrigation  Neuro: pain controlled CV: stable Pulm: off vent.  Doing well Renal: HD.  Receiving bladder irrigation GI: on diet Heme: stable ID: afebrile Endo: SSI  Dispo: continue ICU care for bladder irrigation  Melodie Bouillon, MD 05/06/2019 10:44 AM

## 2019-05-06 NOTE — Progress Notes (Signed)
Informed by nurse during handoff that patient may have CHG sensitivity. CHG protocol suspended.

## 2019-05-06 NOTE — Progress Notes (Signed)
Patient ID: Jesse Murphy, male   DOB: 05-May-1928, 86 y.o.   MRN: [id number]    Advanced Heart Failure Rounding Note  PCP-Cardiologist: Kate Sable, MD   Subjective:    Events - Impella removed 2/23. Intraoperative TEE showed EF ~40% - Had tunneled cath placed 3/9 and left PleurX tube.  - Tolerated iHD on 3/10 and 3/12 - Trach decannulated 3/13 - Cystoscopy 3/15 with radiation cystitis -> fulgeration - Tolerated iHD on 3/17 and received PRBCs for hgB 6.5   Tolerated HD again on Friday without incident.   Remains on CBI. Foley clogged this am and he is having discomfort. BP stable.   Rash getting worse despite stopping most meds.    Objective:   Weight Range: 84.4 kg Body mass index is 28.29 kg/m.   Vital Signs:   Temp:  [97.6 F (36.4 C)-98.2 F (36.8 C)] 97.8 F (36.6 C) (03/21 0811) Pulse Rate:  [73-81] 78 (03/21 0900) Resp:  [14-26] 25 (03/21 0900) BP: (98-132)/(38-80) 132/48 (03/21 0900) SpO2:  [90 %-100 %] 96 % (03/21 0900) Weight:  [84.4 kg] 84.4 kg (03/21 0500) Last BM Date: 05/05/19  Weight change: Filed Weights   05/04/19 0705 05/04/19 1145 05/06/19 0500  Weight: 88.9 kg 86.5 kg 84.4 kg    Intake/Output:   Intake/Output Summary (Last 24 hours) at 05/06/2019 1121 Last data filed at 05/06/2019 0800 Gross per 24 hour  Intake 12770 ml  Output 4575 ml  Net 8195 ml     Physical Exam   General:  Lying in bed . No resp difficulty HEENT: normal Neck: supple. JVP 5-6 trach site healed Carotids 2+ bilat; no bruits. No lymphadenopathy or thryomegaly appreciated. Cor: PMI nondisplaced. Regular rate & rhythm. No rubs, gallops or murmurs. + LIJ tunneled cat Lungs: clear Abdomen: soft, nontender, + distended. No hepatosplenomegaly. No bruits or masses. Good bowel sounds. Extremities: no cyanosis, clubbing, rash, edema diffuse drug rash  Neuro: alert & orientedx3, cranial nerves grossly intact. moves all 4 extremities w/o difficulty. Affect pleasant    Telemetry    NSR 70-80s Personally reviewed   Labs    CBC Recent Labs    05/05/19 2001 05/06/19 0427  WBC 13.2* 12.3*  HGB 8.7* 8.4*  HCT 28.8* 27.7*  MCV 96.6 97.5  PLT 187 527   Basic Metabolic Panel Recent Labs    05/05/19 0427 05/06/19 0427  NA 133* 134*  K 4.7 5.1  CL 95* 97*  CO2 28 27  GLUCOSE 122* 112*  BUN 71* 98*  CREATININE 3.79* 4.96*  CALCIUM 7.9* 7.9*  MG 2.1 2.2  PHOS 3.0 3.5   Liver Function Tests Recent Labs    05/05/19 0427 05/06/19 0427  AST  --  14*  ALT  --  7  ALKPHOS  --  74  BILITOT  --  0.5  PROT  --  5.4*  ALBUMIN 2.3* 2.3*   No results for input(s): LIPASE, AMYLASE in the last 72 hours. Cardiac Enzymes No results for input(s): CKTOTAL, CKMB, CKMBINDEX, TROPONINI in the last 72 hours.  BNP: BNP (last 3 results) Recent Labs    03/22/19 0401 03/26/19 0626 03/27/19 1255  BNP 340.4* 687.5* 800.4*    ProBNP (last 3 results) No results for input(s): PROBNP in the last 8760 hours.   D-Dimer No results for input(s): DDIMER in the last 72 hours. Hemoglobin A1C No results for input(s): HGBA1C in the last 72 hours. Fasting Lipid Panel No results for input(s): CHOL, HDL, LDLCALC, TRIG,  CHOLHDL, LDLDIRECT in the last 72 hours. Thyroid Function Tests No results for input(s): TSH, T4TOTAL, T3FREE, THYROIDAB in the last 72 hours.  Invalid input(s): FREET3  Other results:   Imaging    No results found.   Medications:     Scheduled Medications: . aspirin  81 mg Per Tube Daily  . atorvastatin  80 mg Per Tube q1800  . calcium acetate (Phos Binder)  667 mg Per Tube TID WC  . chlorhexidine  15 mL Mouth Rinse BID  . darbepoetin (ARANESP) injection - DIALYSIS  200 mcg Intravenous Q Wed-HD  . famotidine  10 mg Per Tube Daily  . feeding supplement (PRO-STAT SUGAR FREE 64)  60 mL Per Tube BID  . Gerhardt's butt cream   Topical BID  . hydrocortisone   Rectal BID  . hydrOXYzine  10 mg Per Tube Q6H  . insulin aspart  0-24  Units Subcutaneous Q4H  . insulin glargine  36 Units Subcutaneous Daily  . lipase/protease/amylase)  20,880 Units Per Tube Once   And  . sodium bicarbonate  650 mg Per Tube Once  . mouth rinse  15 mL Mouth Rinse q12n4p  . methylPREDNISolone (SOLU-MEDROL) injection  80 mg Intravenous Daily  . metoCLOPramide (REGLAN) injection  10 mg Intravenous Q6H  . multivitamin  1 tablet Per Tube QHS  . nutrition supplement (JUVEN)  1 packet Per Tube BID BM  . simethicone  80 mg Per Tube BID PC  . sodium chloride flush  10-40 mL Intracatheter Q12H    Infusions: . sodium chloride    . sodium chloride    . sodium chloride Stopped (05/01/19 1307)  . feeding supplement (JEVITY 1.5 CAL/FIBER) 1,000 mL (05/05/19 1232)  . magnesium sulfate bolus IVPB    . norepinephrine (LEVOPHED) Adult infusion Stopped (04/24/19 1800)    PRN Medications: sodium chloride, sodium chloride, oxyCODONE **AND** acetaminophen, alteplase, dextrose, hyoscyamine, levalbuterol, lidocaine (PF), lidocaine-prilocaine, loperamide HCl, ondansetron (ZOFRAN) IV, pentafluoroprop-tetrafluoroeth, promethazine, Resource ThickenUp Clear, sodium chloride flush, sodium chloride flush    Assessment/Plan   1. Acute systolic HF -> Cardiogenic shock - post-op echo on 03/20/19 EF 25-30% (pre-op 30-35%. In 12/2017 EF normal) - Required Impella support post CABG. Impella removed 2/23. Post-op TEE EF 40% - volume status ok . Managed with iHD - tolerating iHD. MWF Schedule  - co-ox 72%   2. CAD s/p CABG this admit (LIMA->LAD, SVG->OM, SVG->RCA on 03/15/19) - No s/s ischemia - Continue ASA +statin  3. Acute hypoxic respiratory failure with Pseudomonas PNA - s/p trach on 03/27/19 - completed meropenem for pseudomonas PNA - trach aspirate 3/5 w/ regrowth of Pseudomonas again.   - Completed antibiotic course.  -Trach decannulated 3/13. Stable on [place] - No change   4. PAF - s/p MAZE and LAA occlusion - Maintaining NSR.  - Off amio due to rash -  No AC with ongoing hematuria  5. AKI  - due to shock/ATN - On CVVHD.  - Tunneled cath placed 3/9  - He is tolerating iHD   6. Hematuria -Ongoing.  Remains on CBI. -Cystoscopy 3/15 with fulgeration - Received 1UPRBC 05/02/19 HGb  6.5>8.5>8.4 - off anticoagulation.  - appears clogged this am. I have asked nurse to re-irrigate. Urology aware  7. Debility, severe - continue PT/OT  8. F/E/N - Tube feeds per Cor-trak.     9. Left Pleural Effusion  - moderate to large. - s/p PleurX on 3/8.  - TCTS managing   10. Diffuse rash - Appearance of drug  induced rash. - Getting worse despite stopping numerous meds. - I am worried drug rash may be related to midodrine. 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 Start steroids. Discussed dosing with PharmD personally.   11. Anemia: - h/o melena and hematuria this admit. Hgb 8.4 - transfuse if < 7 - feraheme ordered - continue aransep q weekly    Glori Bickers, MD  11:21 AM

## 2019-05-06 NOTE — Progress Notes (Addendum)
6 Days Post-Op Subjective: Urine still bloody, CBI still necessary.  Nurses have appropriately been irrigating catheter.  Objective: Vital signs in last 24 hours: Temp:  [97.6 F (36.4 C)-98.2 F (36.8 C)] 97.7 F (36.5 C) (03/21 1617) Pulse Rate:  [71-81] 71 (03/21 1400) Resp:  [14-26] 14 (03/21 1400) BP: (98-134)/(39-65) 116/40 (03/21 1400) SpO2:  [95 %-100 %] 98 % (03/21 1400) Weight:  [84.4 kg] 84.4 kg (03/21 0500)  Intake/Output from previous day: 03/20 0701 - 03/21 0700 In: 9990 [P.O.:100; I.V.:10; NG/GT:880] Out: 0865 [Urine:4575] Intake/Output this shift: Total I/O In: 6065 [I.V.:10; Other:6000; NG/GT:55] Out: -   Physical Exam:  Constitutional: Vital signs reviewed. WD WN in NAD     Lab Results: Recent Labs    05/04/19 0615 05/05/19 2001 05/06/19 0427  HGB 8.4* 8.7* 8.4*  HCT 27.6* 28.8* 27.7*   BMET Recent Labs    05/05/19 0427 05/06/19 0427  NA 133* 134*  K 4.7 5.1  CL 95* 97*  CO2 28 27  GLUCOSE 122* 112*  BUN 71* 98*  CREATININE 3.79* 4.96*  CALCIUM 7.9* 7.9*   No results for input(s): LABPT, INR in the last 72 hours. No results for input(s): LABURIN in the last 72 hours. Results for orders placed or performed during the hospital encounter of 03/13/19  Respiratory Panel by RT PCR (Flu A&B, Covid) - Nasopharyngeal Swab     Status: None   Collection Time: 03/13/19  1:49 PM   Specimen: Nasopharyngeal Swab  Result Value Ref Range Status   SARS Coronavirus 2 by RT PCR NEGATIVE NEGATIVE Final    Comment: (NOTE) SARS-CoV-2 target nucleic acids are NOT DETECTED. The SARS-CoV-2 RNA is generally detectable in upper respiratoy specimens during the acute phase of infection. The lowest concentration of SARS-CoV-2 viral copies this assay can detect is 131 copies/mL. A negative result does not preclude SARS-Cov-2 infection and should not be used as the sole basis for treatment or other patient management decisions. A negative result may occur with   improper specimen collection/handling, submission of specimen other than nasopharyngeal swab, presence of viral mutation(s) within the areas targeted by this assay, and inadequate number of viral copies (<131 copies/mL). A negative result must be combined with clinical observations, patient history, and epidemiological information. The expected result is Negative. Fact Sheet for Patients:  PinkCheek.be Fact Sheet for Healthcare Providers:  GravelBags.it This test is not yet ap proved or cleared by the [place] FDA and  has been authorized for detection and/or diagnosis of SARS-CoV-2 by FDA under an Emergency Use Authorization (EUA). This EUA will remain  in effect (meaning this test can be used) for the duration of the COVID-19 declaration under Section 564(b)(1) of the Act, 21 U.S.C. section 360bbb-3(b)(1), unless the authorization is terminated or revoked sooner.    Influenza A by PCR NEGATIVE NEGATIVE Final   Influenza B by PCR NEGATIVE NEGATIVE Final    Comment: (NOTE) The Xpert Xpress SARS-CoV-2/FLU/RSV assay is intended as an aid in  the diagnosis of influenza from Nasopharyngeal swab specimens and  should not be used as a sole basis for treatment. Nasal washings and  aspirates are unacceptable for Xpert Xpress SARS-CoV-2/FLU/RSV  testing. Fact Sheet for Patients: PinkCheek.be Fact Sheet for Healthcare Providers: GravelBags.it This test is not yet approved or cleared by the [place] FDA and  has been authorized for detection and/or diagnosis of SARS-CoV-2 by  FDA under an Emergency Use Authorization (EUA). This EUA will remain  in effect (meaning this  test can be used) for the duration of the  Covid-19 declaration under Section 564(b)(1) of the Act, 21  U.S.C. section 360bbb-3(b)(1), unless the authorization is  terminated or revoked. Performed at  [facility], [address], [place], [place] [address]   MRSA PCR Screening     Status: None   Collection Time: 03/13/19  5:18 PM   Specimen: Nasal Mucosa; Nasopharyngeal  Result Value Ref Range Status   MRSA by PCR NEGATIVE NEGATIVE Final    Comment:        The GeneXpert MRSA Assay (FDA approved for NASAL specimens only), is one component of a comprehensive MRSA colonization surveillance program. It is not intended to diagnose MRSA infection nor to guide or monitor treatment for MRSA infections. Performed at [facility], [address] [place], [place] [address]   Surgical pcr screen     Status: None   Collection Time: 03/15/19  9:28 PM   Specimen: Nasal Mucosa; Nasal Swab  Result Value Ref Range Status   MRSA, PCR NEGATIVE NEGATIVE Final   Staphylococcus aureus NEGATIVE NEGATIVE Final    Comment: (NOTE) The Xpert SA Assay (FDA approved for NASAL specimens in patients 55 years of age and older), is one component of a comprehensive surveillance program. It is not intended to diagnose infection nor to guide or monitor treatment. Performed at [facility], [place] [address] [place], [place] [address]   Culture, respiratory (non-expectorated)     Status: None   Collection Time: 03/19/19  3:00 PM   Specimen: Tracheal Aspirate; Respiratory  Result Value Ref Range Status   Specimen Description TRACHEAL ASPIRATE  Final   Special Requests Normal  Final   Gram Stain   Final    ABUNDANT WBC PRESENT,BOTH PMN AND MONONUCLEAR FEW GRAM POSITIVE COCCI RARE GRAM NEGATIVE RODS    Culture   Final    FEW Consistent with normal respiratory flora. Performed at [facility], [place] [address], [place], [place] [address]    Report Status 03/21/2019 FINAL  Final  Culture, respiratory (non-expectorated)     Status: None   Collection Time: 03/26/19  6:29 AM   Specimen: Tracheal Aspirate; Respiratory  Result Value Ref Range Status   Specimen Description TRACHEAL ASPIRATE  Final    Special Requests NONE  Final   Gram Stain   Final    ABUNDANT WBC PRESENT,BOTH PMN AND MONONUCLEAR NO ORGANISMS SEEN Performed at [facility], [address] [place], [place] [address]    Culture FEW PSEUDOMONAS AERUGINOSA  Final   Report Status 03/28/2019 FINAL  Final   Organism ID, Bacteria PSEUDOMONAS AERUGINOSA  Final      Susceptibility   Pseudomonas aeruginosa - MIC*    CEFTAZIDIME 8 SENSITIVE Sensitive     CIPROFLOXACIN <=0.25 SENSITIVE Sensitive     GENTAMICIN 4 SENSITIVE Sensitive     IMIPENEM 2 SENSITIVE Sensitive     * FEW PSEUDOMONAS AERUGINOSA  Culture, respiratory (non-expectorated)     Status: None   Collection Time: 04/03/19 11:08 AM   Specimen: Tracheal Aspirate; Respiratory  Result Value Ref Range Status   Specimen Description TRACHEAL ASPIRATE  Final   Special Requests NONE  Final   Gram Stain   Final    FEW WBC PRESENT, PREDOMINANTLY PMN FEW GRAM NEGATIVE RODS RARE YEAST    Culture   Final    FEW PSEUDOMONAS AERUGINOSA FEW CANDIDA TROPICALIS    Report Status 04/06/2019 FINAL  Final   Organism ID,  Bacteria PSEUDOMONAS AERUGINOSA  Final      Susceptibility   Pseudomonas aeruginosa - MIC*    CEFTAZIDIME 32 RESISTANT Resistant     CIPROFLOXACIN <=0.25 SENSITIVE Sensitive     GENTAMICIN 4 SENSITIVE Sensitive     IMIPENEM Value in next row Sensitive      2 SENSITIVEPerformed at [facility] [address] [place], [place] [address]    * FEW PSEUDOMONAS AERUGINOSA  Culture, blood (Routine X 2) w Reflex to ID Panel     Status: None   Collection Time: 04/03/19 12:30 PM   Specimen: BLOOD LEFT HAND  Result Value Ref Range Status   Specimen Description BLOOD LEFT HAND  Final   Special Requests   Final    BOTTLES DRAWN AEROBIC ONLY Blood Culture results may not be optimal due to an inadequate volume of blood received in culture bottles   Culture   Final    NO GROWTH 5 DAYS Performed at [facility], [place] [address] [place], [place]  [address]    Report Status 04/08/2019 FINAL  Final  Culture, blood (Routine X 2) w Reflex to ID Panel     Status: None   Collection Time: 04/03/19 12:35 PM   Specimen: BLOOD LEFT HAND  Result Value Ref Range Status   Specimen Description BLOOD LEFT HAND  Final   Special Requests   Final    BOTTLES DRAWN AEROBIC ONLY Blood Culture results may not be optimal due to an inadequate volume of blood received in culture bottles   Culture   Final    NO GROWTH 5 DAYS Performed at [facility], [place] [address] [place], [place] [address]    Report Status 04/08/2019 FINAL  Final  Culture, respiratory (non-expectorated)     Status: None   Collection Time: 04/07/19 11:10 AM   Specimen: Tracheal Aspirate; Respiratory  Result Value Ref Range Status   Specimen Description TRACHEAL ASPIRATE  Final   Special Requests NONE  Final   Gram Stain   Final    MODERATE WBC PRESENT, PREDOMINANTLY PMN ABUNDANT GRAM NEGATIVE RODS Performed at [facility], [place] [address], [place], [place] [address]    Culture ABUNDANT PSEUDOMONAS AERUGINOSA  Final   Report Status 04/09/2019 FINAL  Final   Organism ID, Bacteria PSEUDOMONAS AERUGINOSA  Final      Susceptibility   Pseudomonas aeruginosa - MIC*    CEFTAZIDIME 16 INTERMEDIATE Intermediate     CIPROFLOXACIN <=0.25 SENSITIVE Sensitive     GENTAMICIN 4 SENSITIVE Sensitive     IMIPENEM 1 SENSITIVE Sensitive     * ABUNDANT PSEUDOMONAS AERUGINOSA  Culture, blood (routine x 2)     Status: None   Collection Time: 04/07/19  2:07 PM   Specimen: BLOOD LEFT HAND  Result Value Ref Range Status   Specimen Description BLOOD LEFT HAND  Final   Special Requests   Final    BOTTLES DRAWN AEROBIC ONLY Blood Culture adequate volume Performed at [facility], [place] [address], [place], [place] [address]    Culture NO GROWTH 5 DAYS  Final   Report Status 04/12/2019 FINAL  Final  Culture, blood (routine x 2)     Status: None   Collection Time: 04/07/19  2:07 PM    Specimen: BLOOD LEFT HAND  Result Value Ref Range Status   Specimen Description BLOOD LEFT HAND  Final   Special Requests   Final    BOTTLES DRAWN AEROBIC ONLY  Blood Culture adequate volume Performed at [facility] [address] [place], [place] [address]    Culture NO GROWTH 5 DAYS  Final   Report Status 04/12/2019 FINAL  Final  Culture, Urine     Status: None   Collection Time: 04/13/19  6:58 PM   Specimen: Urine, Catheterized  Result Value Ref Range Status   Specimen Description URINE, CATHETERIZED  Final   Special Requests NONE  Final   Culture   Final    NO GROWTH Performed at [facility], [address], [place], [place] [address]    Report Status 04/14/2019 FINAL  Final  MRSA PCR Screening     Status: None   Collection Time: 04/15/19 12:53 PM   Specimen: Nasal Mucosa; Nasopharyngeal  Result Value Ref Range Status   MRSA by PCR NEGATIVE NEGATIVE Final    Comment:        The GeneXpert MRSA Assay (FDA approved for NASAL specimens only), is one component of a comprehensive MRSA colonization surveillance program. It is not intended to diagnose MRSA infection nor to guide or monitor treatment for MRSA infections. Performed at [facility], [place] [address], [place], [place] [address]   Culture, respiratory (non-expectorated)     Status: None   Collection Time: 04/20/19 11:35 AM   Specimen: Tracheal Aspirate; Respiratory  Result Value Ref Range Status   Specimen Description TRACHEAL ASPIRATE  Final   Special Requests Normal  Final   Gram Stain   Final    RARE WBC PRESENT, PREDOMINANTLY PMN FEW GRAM NEGATIVE RODS FEW GRAM POSITIVE RODS Performed at [facility], [place] [address], [place], [place] [address]    Culture MODERATE PSEUDOMONAS AERUGINOSA  Final   Report Status 04/23/2019 FINAL  Final   Organism ID, Bacteria PSEUDOMONAS AERUGINOSA  Final      Susceptibility   Pseudomonas aeruginosa - MIC*    CEFTAZIDIME >=64 RESISTANT Resistant      CIPROFLOXACIN <=0.25 SENSITIVE Sensitive     GENTAMICIN 4 SENSITIVE Sensitive     IMIPENEM >=16 RESISTANT Resistant     * MODERATE PSEUDOMONAS AERUGINOSA  Body fluid culture     Status: None   Collection Time: 04/23/19 12:02 PM   Specimen: Pleural, Left; Body Fluid  Result Value Ref Range Status   Specimen Description PLEURAL LEFT  Final   Special Requests NONE  Final   Gram Stain   Final    ABUNDANT WBC PRESENT,BOTH PMN AND MONONUCLEAR NO ORGANISMS SEEN    Culture   Final    NO GROWTH 3 DAYS Performed at [facility], [address], [place], [place] [address]    Report Status 04/26/2019 FINAL  Final    Studies/Results: DG Chest Port 1 View  Result Date: 05/05/2019 CLINICAL DATA:  Tachycardia. EXAM: PORTABLE CHEST 1 VIEW COMPARISON:  May 03, 2019 FINDINGS: Stable left central line. Feeding tube terminates below today's film. The right PICC line terminates in the central SVC. Stable left chest tube. Probable small layering effusions, improved in the interval. No overt edema. Probable atelectasis underlying the effusions. No other changes. IMPRESSION: 1. Small layering effusions with underlying atelectasis. 2. Support apparatus as above. 3. No other acute abnormalities. Electronically Signed   By: Dorise Bullion III M.D   On: 05/05/2019 09:31   VAS [place] UPPER EXT VEIN MAPPING (PRE-OP AVF)  Result Date: 05/06/2019 UPPER EXTREMITY VEIN MAPPING  Indications: Pre-dialysis access. History: ESRD.  Limitations: PICC line in  right arm Comparison Study: No prior study Performing Technologist: Sharion Dove RVS  Examination Guidelines: A complete evaluation includes B-mode imaging, spectral Doppler, color Doppler, and power Doppler as needed of all accessible portions of each vessel. Bilateral testing is considered an integral part of a complete examination. Limited examinations for reoccurring indications may be performed as noted. +-----------------+-------------+----------+---------+  Right Cephalic   Diameter (cm)Depth (cm)Findings  +-----------------+-------------+----------+---------+ Prox upper arm       1.37        0.52             +-----------------+-------------+----------+---------+ Mid upper arm        0.15        0.65             +-----------------+-------------+----------+---------+ Dist upper arm       0.18        1.15   branching +-----------------+-------------+----------+---------+ Antecubital fossa    0.25        0.68             +-----------------+-------------+----------+---------+ Prox forearm         0.23        0.40   branching +-----------------+-------------+----------+---------+ Mid forearm          0.16        1.15             +-----------------+-------------+----------+---------+ Wrist                0.18        0.26             +-----------------+-------------+----------+---------+ +-----------------+-------------+----------+---------+ Right Basilic    Diameter (cm)Depth (cm)Findings  +-----------------+-------------+----------+---------+ Prox upper arm       0.51        2.49    origin   +-----------------+-------------+----------+---------+ Mid upper arm        0.40        1.92             +-----------------+-------------+----------+---------+ Dist upper arm       0.45        1.21   branching +-----------------+-------------+----------+---------+ Antecubital fossa    0.29        0.72             +-----------------+-------------+----------+---------+ Prox forearm         0.30        0.71   branching +-----------------+-------------+----------+---------+ Mid forearm          0.25        1.23             +-----------------+-------------+----------+---------+ Wrist                0.18        0.35             +-----------------+-------------+----------+---------+ +-----------------+-------------+----------+--------+ Left Cephalic    Diameter (cm)Depth (cm)Findings  +-----------------+-------------+----------+--------+ Prox upper arm       0.24        0.62            +-----------------+-------------+----------+--------+ Mid upper arm        0.30        0.64            +-----------------+-------------+----------+--------+ Dist upper arm       0.34        0.58            +-----------------+-------------+----------+--------+ Antecubital fossa    0.35  0.32            +-----------------+-------------+----------+--------+ Prox forearm         0.23        1.36            +-----------------+-------------+----------+--------+ Mid forearm          0.36        0.57            +-----------------+-------------+----------+--------+ Wrist                0.29        0.51            +-----------------+-------------+----------+--------+ +-----------------+-------------+----------+-----------------------+ Left Basilic     Diameter (cm)Depth (cm)       Findings         +-----------------+-------------+----------+-----------------------+ Prox upper arm                          not visualized and line +-----------------+-------------+----------+-----------------------+ Mid upper arm                           not visualized and line +-----------------+-------------+----------+-----------------------+ Dist upper arm       0.30        0.77                           +-----------------+-------------+----------+-----------------------+ Antecubital fossa    0.37        0.41                           +-----------------+-------------+----------+-----------------------+ Prox forearm         0.39        0.59                           +-----------------+-------------+----------+-----------------------+ Mid forearm          0.22        0.81                           +-----------------+-------------+----------+-----------------------+ Wrist                0.20        0.98                            +-----------------+-------------+----------+-----------------------+ *See table(s) above for measurements and observations.  Diagnosing physician: Ruta Hinds MD Electronically signed by Ruta Hinds MD on 05/06/2019 at 12:36:50 PM.    Final    Hand irrigation with 1 L of saline performed.  A few small clots liberated, irrigated until clear.  Previous irrigation by nursing staff was quite effective in evacuating clots. Assessment/Plan:   Persistent bleeding with radiation cystitis.  Recent cystoscopy and fulguration performed 6 days ago.  Still with mild bleeding.  I think at this point is worthwhile starting in alum irrigation.  I have put those orders in.  This can be given by way of continuous bladder irrigation.  We will continue to follow.    30 minutes spent total inpatient care   LOS: 53 days   Jorja Loa 05/06/2019, 4:32 PM

## 2019-05-06 NOTE — Progress Notes (Signed)
Patient has had 3 bloody stools toda. CBC done hgb WNL

## 2019-05-06 NOTE — Progress Notes (Signed)
Patient has had 2 bloody stools as of 1430, hemoccult ordered. Patient's urinary catheter irrigated once at 0900 and again at 1400, few small clots removed and flow briefly returned. Urology paged at 1500.

## 2019-05-07 LAB — CBC
HCT: 26.4 % — ABNORMAL LOW (ref 39.0–52.0)
HCT: 24.7 % — ABNORMAL LOW (ref 39.0–52.0)
Hemoglobin: 7.9 g/dL — ABNORMAL LOW (ref 13.0–17.0)
Hemoglobin: 7.3 g/dL — ABNORMAL LOW (ref 13.0–17.0)
MCH: 29.5 pg (ref 26.0–34.0)
MCH: 29.3 pg (ref 26.0–34.0)
MCHC: 29.9 g/dL — ABNORMAL LOW (ref 30.0–36.0)
MCHC: 29.6 g/dL — ABNORMAL LOW (ref 30.0–36.0)
MCV: 98.5 fL (ref 80.0–100.0)
MCV: 99.2 fL (ref 80.0–100.0)
Platelets: 198 10*3/uL (ref 150–400)
Platelets: 197 10*3/uL (ref 150–400)
RBC: 2.68 MIL/uL — ABNORMAL LOW (ref 4.22–5.81)
RBC: 2.49 MIL/uL — ABNORMAL LOW (ref 4.22–5.81)
RDW: 17.1 % — ABNORMAL HIGH (ref 11.5–15.5)
RDW: 17.2 % — ABNORMAL HIGH (ref 11.5–15.5)
WBC: 12.7 10*3/uL — ABNORMAL HIGH (ref 4.0–10.5)
WBC: 11 10*3/uL — ABNORMAL HIGH (ref 4.0–10.5)
nRBC: 0.2 % (ref 0.0–0.2)
nRBC: 0.4 % — ABNORMAL HIGH (ref 0.0–0.2)

## 2019-05-07 LAB — GLUCOSE, CAPILLARY
Glucose-Capillary: 168 mg/dL — ABNORMAL HIGH (ref 70–99)
Glucose-Capillary: 136 mg/dL — ABNORMAL HIGH (ref 70–99)
Glucose-Capillary: 175 mg/dL — ABNORMAL HIGH (ref 70–99)
Glucose-Capillary: 190 mg/dL — ABNORMAL HIGH (ref 70–99)
Glucose-Capillary: 165 mg/dL — ABNORMAL HIGH (ref 70–99)

## 2019-05-07 LAB — RENAL FUNCTION PANEL
Albumin: 2.4 g/dL — ABNORMAL LOW (ref 3.5–5.0)
Anion gap: 12 (ref 5–15)
BUN: 135 mg/dL — ABNORMAL HIGH (ref 8–23)
CO2: 27 mmol/L (ref 22–32)
Calcium: 8.5 mg/dL — ABNORMAL LOW (ref 8.9–10.3)
Chloride: 94 mmol/L — ABNORMAL LOW (ref 98–111)
Creatinine, Ser: 5.87 mg/dL — ABNORMAL HIGH (ref 0.61–1.24)
GFR calc Af Amer: 9 mL/min — ABNORMAL LOW (ref >60)
GFR calc non Af Amer: 8 mL/min — ABNORMAL LOW (ref >60)
Glucose, Bld: 188 mg/dL — ABNORMAL HIGH (ref 70–99)
Phosphorus: 3 mg/dL (ref 2.5–4.6)
Potassium: 6.3 mmol/L (ref 3.5–5.1)
Sodium: 133 mmol/L — ABNORMAL LOW (ref 135–145)

## 2019-05-07 LAB — HEPATITIS B SURFACE ANTIGEN: Hepatitis B Surface Ag: NONREACTIVE

## 2019-05-07 LAB — MAGNESIUM: Magnesium: 2.4 mg/dL (ref 1.7–2.4)

## 2019-05-07 MED ORDER — FLUCONAZOLE IN SODIUM CHLORIDE 400-0.9 MG/200ML-% IV SOLN
400.0000 mg | Freq: Once | INTRAVENOUS | Status: AC
  Administered 2019-05-07: 400 mg via INTRAVENOUS
  Filled 2019-05-07: qty 200

## 2019-05-07 MED ORDER — FLUCONAZOLE IN SODIUM CHLORIDE 200-0.9 MG/100ML-% IV SOLN
200.0000 mg | INTRAVENOUS | Status: AC
  Administered 2019-05-08 – 2019-05-11 (×4): 200 mg via INTRAVENOUS
  Filled 2019-05-07 (×4): qty 100

## 2019-05-07 MED ORDER — PANTOPRAZOLE SODIUM 40 MG PO PACK
40.0000 mg | PACK | Freq: Every day | ORAL | Status: DC
  Administered 2019-05-07: 40 mg
  Filled 2019-05-07: qty 20

## 2019-05-07 MED ORDER — HEPARIN SODIUM (PORCINE) 1000 UNIT/ML IJ SOLN
INTRAMUSCULAR | Status: AC
  Administered 2019-05-07: 3800 [IU] via ARTERIOVENOUS_FISTULA
  Filled 2019-05-07: qty 4

## 2019-05-07 MED ORDER — CHLORHEXIDINE GLUCONATE CLOTH 2 % EX PADS
6.0000 | MEDICATED_PAD | Freq: Every day | CUTANEOUS | Status: DC
  Administered 2019-05-07 – 2019-05-18 (×10): 6 via TOPICAL

## 2019-05-07 NOTE — Progress Notes (Signed)
Pt with large red/bloody BM, Md aware. No changes in VS at this time. Will continue to monitor.

## 2019-05-07 NOTE — Progress Notes (Signed)
[facility] notified of patient's K+ 6.3 on am labs. Per hemodialysis, RN en route to department to perform patient's treatment.

## 2019-05-07 NOTE — Progress Notes (Signed)
Jesse Murphy Progress Note    Assessment/ Plan:   1. AKI(Cr was 0.7 prior to surgery)- due to postoperative CHF/cardiogenic - started on CRRT  03/29/19 - 2/24. Attempted IHD on 2/26 with minimal UF --> increased pressors and rapid afib.  Then CRRT on 3/1-- 3/8 then switched back to IHD which he is tolerating. Tolerating IHD.  MWF schedule - HD now.  Has been vein mapped; will see how he's doing this week prior to consulting [facility].   Has been getting 2.5L off per rx.  No heparin in rx d/t hematuria.  2K bath after 1K x 1 hr this am due to hyperkalemia.     2. Acute systolic CHF/cardiogenic shock- EF 25-30%; s/p Impella- now out. optimize volume as above. On midodrine  15 mg TID.  off pressors- still overloaded, trying to chip away at volume   3. CAD s/p [id number] 4/65/03 complicated by cardiogenic shock and ARF.Postop echo EF 25-30  4. Acute hypoxic respiratory failure with pseudomonas pneumonia- s/p trach on 03/27/19.s/pabx per PCCM-> completed abx.  Decannulated  5. 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    6. ABLA- Hx Melen and hematuria. Transfuse as needed.Increase aranesp to 200 q week.  % sat 11, feraheme x 1 and then can get the rest of his iron load with HD--> ordered  7. Protein malnutrition- moderate to severe- alb 2.0  8. Secondary hyperparathyroidism/bone mineral -  phos rising,s/p phoslo -.  pth 116  9. Rash: looks like drug reaction--> emollients/ hydroxyzine prn, ? Abx, amio  10.  Bloody bowel movement- FOBT pending, per primary  11.  Disposition-  trach is out--> this was a huge step re: outpatient HD candidacy.  Will see how he does throughout week and if continues to improve will consider perm access (Ie AVF/AVG)--> only has L IJ TDC for now  Subjective:    HD this AM.  I/Os yesterday 10.4 / 7.4 with CBI running (alum now). Blood BMs yesterday, Hb stable. He says  no new complaints this AM.     Objective:   BP (!) 125/45   Pulse 86   Temp 98 F (36.7 C) (Oral)   Resp (!) 30   Ht 5\' 8"  (1.727 m)   Wt 88.1 kg   SpO2 92%   BMI 29.53 kg/m   Intake/Output Summary (Last 24 hours) at 05/07/2019 5465 Last data filed at 05/07/2019 0700 Gross per 24 hour  Intake 4475 ml  Output 7450 ml  Net -2975 ml   Weight change: 3.7 kg  Physical Exam: Gen: older gentleman, NAD, calm in bed NECK: trach site dressed CVS: RRR Resp: clear anteriorly, bases diminished Abd: soft Ext: trace dependent and flank edema, improving, lacy rash/ erythematous, itchy over bilateral legs, some on stomach, improving ACCESS: L IJ TDC  Imaging: VAS [place] UPPER EXT VEIN MAPPING (PRE-OP AVF)  Result Date: 05/06/2019 UPPER EXTREMITY VEIN MAPPING  Indications: Pre-dialysis access. History: ESRD.  Limitations: PICC line in right arm Comparison Study: No prior study Performing Technologist: Sharion Dove RVS  Examination Guidelines: A complete evaluation includes B-mode imaging, spectral Doppler, color Doppler, and power Doppler as needed of all accessible portions of each vessel. Bilateral testing is considered an integral part of a complete examination. Limited examinations for reoccurring indications may be performed as noted. +-----------------+-------------+----------+---------+ Right Cephalic   Diameter (cm)Depth (cm)Findings  +-----------------+-------------+----------+---------+ Prox upper arm       1.37  0.52             +-----------------+-------------+----------+---------+ Mid upper arm        0.15        0.65             +-----------------+-------------+----------+---------+ Dist upper arm       0.18        1.15   branching +-----------------+-------------+----------+---------+ Antecubital fossa    0.25        0.68             +-----------------+-------------+----------+---------+ Prox forearm         0.23        0.40   branching  +-----------------+-------------+----------+---------+ Mid forearm          0.16        1.15             +-----------------+-------------+----------+---------+ Wrist                0.18        0.26             +-----------------+-------------+----------+---------+ +-----------------+-------------+----------+---------+ Right Basilic    Diameter (cm)Depth (cm)Findings  +-----------------+-------------+----------+---------+ Prox upper arm       0.51        2.49    origin   +-----------------+-------------+----------+---------+ Mid upper arm        0.40        1.92             +-----------------+-------------+----------+---------+ Dist upper arm       0.45        1.21   branching +-----------------+-------------+----------+---------+ Antecubital fossa    0.29        0.72             +-----------------+-------------+----------+---------+ Prox forearm         0.30        0.71   branching +-----------------+-------------+----------+---------+ Mid forearm          0.25        1.23             +-----------------+-------------+----------+---------+ Wrist                0.18        0.35             +-----------------+-------------+----------+---------+ +-----------------+-------------+----------+--------+ Left Cephalic    Diameter (cm)Depth (cm)Findings +-----------------+-------------+----------+--------+ Prox upper arm       0.24        0.62            +-----------------+-------------+----------+--------+ Mid upper arm        0.30        0.64            +-----------------+-------------+----------+--------+ Dist upper arm       0.34        0.58            +-----------------+-------------+----------+--------+ Antecubital fossa    0.35        0.32            +-----------------+-------------+----------+--------+ Prox forearm         0.23        1.36            +-----------------+-------------+----------+--------+ Mid forearm          0.36         0.57            +-----------------+-------------+----------+--------+ Wrist  0.29        0.51            +-----------------+-------------+----------+--------+ +-----------------+-------------+----------+-----------------------+ Left Basilic     Diameter (cm)Depth (cm)       Findings         +-----------------+-------------+----------+-----------------------+ Prox upper arm                          not visualized and line +-----------------+-------------+----------+-----------------------+ Mid upper arm                           not visualized and line +-----------------+-------------+----------+-----------------------+ Dist upper arm       0.30        0.77                           +-----------------+-------------+----------+-----------------------+ Antecubital fossa    0.37        0.41                           +-----------------+-------------+----------+-----------------------+ Prox forearm         0.39        0.59                           +-----------------+-------------+----------+-----------------------+ Mid forearm          0.22        0.81                           +-----------------+-------------+----------+-----------------------+ Wrist                0.20        0.98                           +-----------------+-------------+----------+-----------------------+ *See table(s) above for measurements and observations.  Diagnosing physician: Ruta Hinds MD Electronically signed by Ruta Hinds MD on 05/06/2019 at 12:36:50 PM.    Final     Labs: BMET Recent Labs  Lab 05/01/19 5284 05/02/19 0425 05/03/19 1324 05/04/19 0429 05/05/19 0427 05/06/19 0427 05/07/19 0416  NA 137 137 138 133* 133* 134* 133*  K 4.7 5.2* 4.8 5.2* 4.7 5.1 6.3*  CL 98 98 98 93* 95* 97* 94*  CO2 28 25 29 26 28 27 27   GLUCOSE 138* 94 125* 156* 122* 112* 188*  BUN 52* 79* 50* 88* 71* 98* 135*  CREATININE 3.73* 5.04* 3.83* 4.95* 3.79* 4.96* 5.87*  CALCIUM 7.8*  8.2* 8.4* 7.9* 7.9* 7.9* 8.5*  PHOS 4.8* 5.7* 4.7* 4.5 3.0 3.5 3.0   CBC Recent Labs  Lab 05/02/19 2030 05/02/19 2030 05/04/19 0615 05/05/19 2001 05/06/19 0427 05/06/19 1723  WBC 9.2  --  10.9* 13.2* 12.3*  --   HGB 8.5*   < > 8.4* 8.7* 8.4* 8.5*  HCT 26.8*   < > 27.6* 28.8* 27.7* 28.2*  MCV 92.7  --  93.6 96.6 97.5  --   PLT 142*  --  190 187 186  --    < > = values in this interval not displayed.    Medications:    . aspirin  81 mg Per Tube Daily  . calcium acetate (Phos Binder)  667 mg Per Tube TID WC  . chlorhexidine  15  mL Mouth Rinse BID  . Chlorhexidine Gluconate Cloth  6 each Topical Daily  . darbepoetin (ARANESP) injection - DIALYSIS  200 mcg Intravenous Q Wed-HD  . famotidine  10 mg Per Tube Daily  . feeding supplement (PRO-STAT SUGAR FREE 64)  60 mL Per Tube BID  . Gerhardt's butt cream   Topical BID  . heparin      . hydrocortisone   Rectal BID  . hydrOXYzine  10 mg Per Tube Q6H  . insulin aspart  0-24 Units Subcutaneous Q4H  . insulin glargine  36 Units Subcutaneous Daily  . lipase/protease/amylase)  20,880 Units Per Tube Once   And  . sodium bicarbonate  650 mg Per Tube Once  . mouth rinse  15 mL Mouth Rinse q12n4p  . methylPREDNISolone (SOLU-MEDROL) injection  80 mg Intravenous Daily  . multivitamin  1 tablet Per Tube QHS  . nutrition supplement (JUVEN)  1 packet Per Tube BID BM  . sodium chloride flush  10-40 mL Intracatheter Q12H     Jannifer Hick MD [facility] Pager [phone]

## 2019-05-07 NOTE — Progress Notes (Signed)
Pharmacy Antibiotic Note  Jesse Murphy is a 86 y.o. male admitted on 03/13/2019 with Afib with RVR. Patient s/p MAZE, s/p CABG, s/p impella. Patient now with bright red blood per rectum and difficulty swallowing foods and liquids. Pharmacy has been consulted for fluconazole dosing for empiric treatment of esophageal candidiasis. Of note, patient is receiving iHD. Qtc 460 on 3/14.   Plan: Fluconazole 400mg  IV x1 followed by 200mg  IV daily administered after HD on HD days Monitor QTc, clinical progression, and length of therapy.   Height: 5\' 8"  (172.7 cm) Weight: 194 lb 3.6 oz (88.1 kg) IBW/kg (Calculated) : 68.4  Temp (24hrs), Avg:97.8 F (36.6 C), Min:97.1 F (36.2 C), Max:98.1 F (36.7 C)  Recent Labs  Lab 05/02/19 0425 05/02/19 2030 05/03/19 0441 05/04/19 0429 05/04/19 0615 05/05/19 0427 05/05/19 2001 05/06/19 0427 05/07/19 0416 05/07/19 1230  WBC  --  9.2  --   --  10.9*  --  13.2* 12.3*  --  12.7*  CREATININE   < >  --  3.83* 4.95*  --  3.79*  --  4.96* 5.87*  --    < > = values in this interval not displayed.    Estimated Creatinine Clearance: 9.4 mL/min (A) (by C-G formula based on SCr of 5.87 mg/dL (H)).    No Known Allergies  Antimicrobials this admission: Cipro 3/7>>3/12 CTX 2/1 >> 2/7; 3/5 >> 3/6 Cefepime 2/8 >> 2/10 Fortaz 2/10 >> 2/16 Vanc 2/16>>2/19 Mero 2/20>>2/26  Dose adjustments this admission: N/A  Microbiology results: 3/8 L pleural fluid - ngtd 3/5 TA - pseudomonas (R ceftazidime, imipenem) 2/26 UCx - ngF 2/20 BCx - ngF 2/20 TA - abundant pseudomonas (I-ceftaz) 2/16 Bcx- ngF 2/16 resp cx- few candida tropicalis, few pseudomonas aeruginosa- R to ceftaz 2/8 resp cx - Pseudomonas- R - cefepime (not included on report, S fortaz,cipro, gent, primaxin) 2/1 TA: normal flora 1/28 MRSA: neg 1/26 COVID/flu: neg  Thank you for allowing pharmacy to be a part of this patient's care.  Cristela Felt, PharmD PGY1 Pharmacy Resident [employer]:  [phone]  05/07/2019 1:05 PM

## 2019-05-07 NOTE — Progress Notes (Signed)
PT Cancellation Note  Patient Details Name: Jesse Murphy MRN: [id number] DOB: 03-Mar-1928   Cancelled Treatment:    Reason Eval/Treat Not Completed: Patient at procedure or test/unavailable(receiving HD) this AM and with rectal bleeding.    Shary Decamp [facility] 05/07/2019, 3:16 PM [place] Pager [phone] Office [phone]

## 2019-05-07 NOTE — Progress Notes (Addendum)
Patient ID: Jesse Murphy, male   DOB: September 19, 1928, 86 y.o.   MRN: [id number] f    Advanced Heart Failure Rounding Note  PCP-Cardiologist: Kate Sable, MD   Subjective:    Events - Impella removed 2/23. Intraoperative TEE showed EF ~40% - Had tunneled cath placed 3/9 and left PleurX tube.  - Tolerated iHD on 3/10 and 3/12 - Trach decannulated 3/13 - Cystoscopy 3/15 with radiation cystitis -> fulgeration - Tolerated iHD on 3/17 and received PRBCs for hgB 6.5   Started on steroids yesterday for worsening drug rash. Midodrine stopped.  On iHD now. Tolerating so far.   Continue with CBI for hematuria. Now having copious BRBPR. Reports dysphagia. Still with Cor-trak feeds   Objective:   Weight Range: 88.1 kg Body mass index is 29.53 kg/m.   Vital Signs:   Temp:  [97.7 F (36.5 C)-98.1 F (36.7 C)] 98 F (36.7 C) (03/22 0811) Pulse Rate:  [71-87] 86 (03/22 0900) Resp:  [13-30] 30 (03/22 0900) BP: (96-144)/(40-57) 125/45 (03/22 0900) SpO2:  [88 %-100 %] 92 % (03/22 0900) Weight:  [88.1 kg] 88.1 kg (03/22 0500) Last BM Date: 05/06/19  Weight change: Filed Weights   05/04/19 1145 05/06/19 0500 05/07/19 0500  Weight: 86.5 kg 84.4 kg 88.1 kg    Intake/Output:   Intake/Output Summary (Last 24 hours) at 05/07/2019 0910 Last data filed at 05/07/2019 0700 Gross per 24 hour  Intake 4475 ml  Output 7450 ml  Net -2975 ml     Physical Exam   General:  Lying in bed. Awake on HD No resp difficulty HEENT: normal + cor-trak Neck: supple. JVP 8-9  trach site healed Carotids 2+ bilat; no bruits. No lymphadenopathy or thryomegaly appreciated. Cor: PMI nondisplaced. Regular rate & rhythm. No rubs, gallops or murmurs. + LIJ tunneled HD cath Lungs: clear Abdomen: soft, nontender, nondistended. No hepatosplenomegaly. No bruits or masses. Good bowel sounds. Extremities: no cyanosis, clubbing, rash, tr-1+ edema + diffuse maculopapular rash Neuro: alert & orientedx3, cranial nerves  grossly intact. moves all 4 extremities w/o difficulty. Affect pleasant   Telemetry    NSR 70-80s Personally reviewed   Labs    CBC Recent Labs    05/05/19 2001 05/05/19 2001 05/06/19 0427 05/06/19 1723  WBC 13.2*  --  12.3*  --   HGB 8.7*   < > 8.4* 8.5*  HCT 28.8*   < > 27.7* 28.2*  MCV 96.6  --  97.5  --   PLT 187  --  186  --    < > = values in this interval not displayed.   Basic Metabolic Panel Recent Labs    05/06/19 0427 05/07/19 0416  NA 134* 133*  K 5.1 6.3*  CL 97* 94*  CO2 27 27  GLUCOSE 112* 188*  BUN 98* 135*  CREATININE 4.96* 5.87*  CALCIUM 7.9* 8.5*  MG 2.2 2.4  PHOS 3.5 3.0   Liver Function Tests Recent Labs    05/06/19 0427 05/07/19 0416  AST 14*  --   ALT 7  --   ALKPHOS 74  --   BILITOT 0.5  --   PROT 5.4*  --   ALBUMIN 2.3* 2.4*   No results for input(s): LIPASE, AMYLASE in the last 72 hours. Cardiac Enzymes No results for input(s): CKTOTAL, CKMB, CKMBINDEX, TROPONINI in the last 72 hours.  BNP: BNP (last 3 results) Recent Labs    03/22/19 0401 03/26/19 0626 03/27/19 1255  BNP 340.4* 687.5* 800.4*  ProBNP (last 3 results) No results for input(s): PROBNP in the last 8760 hours.   D-Dimer No results for input(s): DDIMER in the last 72 hours. Hemoglobin A1C No results for input(s): HGBA1C in the last 72 hours. Fasting Lipid Panel No results for input(s): CHOL, HDL, LDLCALC, TRIG, CHOLHDL, LDLDIRECT in the last 72 hours. Thyroid Function Tests No results for input(s): TSH, T4TOTAL, T3FREE, THYROIDAB in the last 72 hours.  Invalid input(s): FREET3  Other results:   Imaging    No results found.   Medications:     Scheduled Medications: . aspirin  81 mg Per Tube Daily  . calcium acetate (Phos Binder)  667 mg Per Tube TID WC  . chlorhexidine  15 mL Mouth Rinse BID  . Chlorhexidine Gluconate Cloth  6 each Topical Daily  . darbepoetin (ARANESP) injection - DIALYSIS  200 mcg Intravenous Q Wed-HD  .  famotidine  10 mg Per Tube Daily  . feeding supplement (PRO-STAT SUGAR FREE 64)  60 mL Per Tube BID  . Gerhardt's butt cream   Topical BID  . heparin      . hydrocortisone   Rectal BID  . hydrOXYzine  10 mg Per Tube Q6H  . insulin aspart  0-24 Units Subcutaneous Q4H  . insulin glargine  36 Units Subcutaneous Daily  . lipase/protease/amylase)  20,880 Units Per Tube Once   And  . sodium bicarbonate  650 mg Per Tube Once  . mouth rinse  15 mL Mouth Rinse q12n4p  . methylPREDNISolone (SOLU-MEDROL) injection  80 mg Intravenous Daily  . multivitamin  1 tablet Per Tube QHS  . nutrition supplement (JUVEN)  1 packet Per Tube BID BM  . sodium chloride flush  10-40 mL Intracatheter Q12H    Infusions: . sodium chloride    . sodium chloride    . sodium chloride Stopped (05/01/19 1307)  . alum bladder irrigation 300 mL/hr at 05/07/19 0748  . feeding supplement (JEVITY 1.5 CAL/FIBER) 1,000 mL (05/07/19 0427)  . ferric gluconate (FERRLECIT/NULECIT) IV    . norepinephrine (LEVOPHED) Adult infusion Stopped (04/24/19 1800)    PRN Medications: sodium chloride, sodium chloride, oxyCODONE **AND** acetaminophen, alteplase, dextrose, hyoscyamine, levalbuterol, lidocaine (PF), lidocaine-prilocaine, loperamide HCl, ondansetron (ZOFRAN) IV, pentafluoroprop-tetrafluoroeth, promethazine, Resource ThickenUp Clear, sodium chloride flush, sodium chloride flush    Assessment/Plan   1. Acute systolic HF -> Cardiogenic shock - post-op echo on 03/20/19 EF 25-30% (pre-op 30-35%. In 12/2017 EF normal) - Required Impella support post CABG. Impella removed 2/23. Post-op TEE EF 40% - volume status slightly up . Managed with iHD - tolerating iHD. MWF Schedule. On machine now  - co-ox 72% on 3/21 - unable to tolerate GDMT with low BP and ESRD  2. CAD s/p CABG this admit (LIMA->LAD, SVG->OM, SVG->RCA on 03/15/19) - No s/s ischemia - Continue ASA +statin  3. Acute hypoxic respiratory failure with Pseudomonas PNA -  s/p trach on 03/27/19 - completed meropenem for pseudomonas PNA - trach aspirate 3/5 w/ regrowth of Pseudomonas again.   - Completed antibiotic course.  -Trach decannulated 3/13. Stable on [place] - No change   4. PAF - s/p MAZE and LAA occlusion - Maintaining NSR.  - Off amio due to rash - No AC with ongoing hematuria - No AC  5. AKI  - due to shock/ATN - On CVVHD.  - Tunneled cath placed 3/9  - He is tolerating iHD. Midodrine now off. Wil have to follow closely  6. Hematuria -Ongoing.  Remains on CBI. -Cystoscopy  3/15 with fulgeration - Received 1UPRBC 05/02/19 HGb  6.5>8.5>8.4 - Needs CBC today - off anticoagulation. - Urology starting aluminum irrigation   7. Debility, severe - continue PT/OT  8. F/E/N - Tube feeds per Cor-trak.     9. Left Pleural Effusion  - moderate to large. - s/p PleurX on 3/8.  - TCTS managing   10. Diffuse rash - Appearance of drug induced rash. - Getting worse despite stopping numerous meds. - I am worried drug rash may be related to midodrine vs Aranesp. Will need to stop  - Concern for early Stevens-Johnson. Steroids started 3/21 - Improved today - Discussed dosing with PharmD personally.   11. Anemia: - h/o melena and hematuria this admit. Now with copious BRBPR - check CBC - transfuse if < 7  12. Dysphagia and BRBPR - ? Possible esophagitis +/- radiation proctitis - Will consult GI  13. Hyperkalemia - HD today  Glori Bickers, MD  9:10 AM

## 2019-05-07 NOTE — Consult Note (Signed)
Referring Provider: Dr. Haroldine Laws Primary Care Physician:  Leeanne Rio, MD Primary Gastroenterologist:  Althia Forts  Reason for Consultation:  Rectal bleeding; Dysphagia  HPI: Jesse Murphy is a 86 y.o. male s/p CABG and pneumonia seen for a consult due to the acute onset of rectal bleeding overnight with large amount of bright red blood per rectum last occurring at 8AM. 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 Laural Golden that was actively bleeding at that time and hemospray used to stop the bleeding. Sigmoid diverticulosis and external hemorhoids also noted. Previously reportedly had melena that resolved earlier in his hospitalization. He also is being seen for dysphagia that has started since being hospitalized reporting that he gets choked with his first swallow of food and cannot eat after that. Gets choked from swallowing liquids as well. Denies any trouble swallowing food or liquids prior to hospitalization. Denies heartburn. C[place]rak in place. On dysphagia 1 diet. On CVVHD right now. Nurse in room.   Past Medical History:  Diagnosis Date  . Atrial fibrillation ([place])   . CAD (coronary artery disease)    a. s/p PCI in 1992 b. Coronary CT in 01/2018 showing extensive coronary calcification; FFR indeterminate --> medical management pursued at that time as patient not interested in repeat cath  . Cancer [facility])    prostate  . Dyspnea   . Hypertension     Past Surgical History:  Procedure Laterality Date  . CHEST TUBE INSERTION Left 04/23/2019   Procedure: INSERTION PLEURAL DRAINAGE CATHETER TO DRAIN LEFT PLEURAL EFFUSION;  Surgeon: Ivin Poot, MD;  Location: [place];  Service: Thoracic;  Laterality: Left;  . CLIPPING OF ATRIAL APPENDAGE N/A 03/16/2019   Procedure: CLIPPING OF LEFT  ATRIAL APPENDAGE - USING ATRICLIP SIZE 40;  Surgeon: Ivin Poot, MD;  Location: [place];  Service: Open Heart Surgery;  Laterality: N/A;  . COLONOSCOPY  N/A 08/25/2017   Procedure: COLONOSCOPY;  Surgeon: Rogene Houston, MD;  [facility];  Service: Endoscopy;  Laterality: N/A;  . CORONARY ARTERY BYPASS GRAFT N/A 03/16/2019   Procedure: CORONARY ARTERY BYPASS GRAFTING (CABG), ON PUMP, TIMES THREE, USING LEFT INTERNAL MAMMARY ARTERY, RIGHT GREATER SAPHENOUS VEIN HARVESTED ENDOSCOPICALLY;  Surgeon: Ivin Poot, MD;  Location: [place];  Service: Open Heart Surgery;  Laterality: N/A;  swan only  . CYSTOSCOPY WITH FULGERATION N/A 04/30/2019   Procedure: CYSTOSCOPY WITH FULGERATION;  Surgeon: Irine Seal, MD;  Location: [place];  Service: Urology;  Laterality: N/A;  . HERNIA REPAIR    . IR FLUORO GUIDE CV LINE LEFT  04/23/2019  . IR US GUIDE VASC ACCESS LEFT  04/23/2019  . MAZE N/A 03/16/2019   Procedure: MAZE;  Surgeon: Ivin Poot, MD;  Location: [place];  Service: Open Heart Surgery;  Laterality: N/A;  . PLACEMENT OF IMPELLA LEFT VENTRICULAR ASSIST DEVICE Right 03/27/2019   Procedure: Placement Of Impella Left Ventricular Assist Device using ABIOMED Impella 5.5 with SmartAssist Device.;  Surgeon: Wonda Olds, MD;  [facility];  Service: Thoracic;  Laterality: Right;  . PROSTATE SURGERY    . REMOVAL OF IMPELLA LEFT VENTRICULAR ASSIST DEVICE N/A 04/10/2019   Procedure: REMOVAL OF IMPELLA LEFT VENTRICULAR ASSIST DEVICE WITH INSERTION OF RIGHT FEMORAL ARTERIAL LINE;  Surgeon: Ivin Poot, MD;  Location: [place];  Service: Open Heart Surgery;  Laterality: N/A;  . RIGHT/LEFT HEART CATH AND CORONARY ANGIOGRAPHY N/A 03/15/2019   Procedure:  RIGHT/LEFT HEART CATH AND CORONARY ANGIOGRAPHY;  Surgeon: Burnell Blanks, MD;  Location: [place] CV [facility];  Service: Cardiovascular;  Laterality: N/A;  . TEE WITHOUT CARDIOVERSION N/A 03/16/2019   Procedure: TRANSESOPHAGEAL ECHOCARDIOGRAM (TEE);  Surgeon: Prescott Gum, Collier Salina, MD;  Location: [place];  Service: Open Heart Surgery;  Laterality: N/A;  . TRACHEOSTOMY TUBE PLACEMENT N/A 03/27/2019    Procedure: TRACHEOSTOMY placed using Shiley 8DCT Cuffed.;  Surgeon: Wonda Olds, MD;  [facility];  Service: Thoracic;  Laterality: N/A;    Prior to Admission medications   Medication Sig Start Date End Date Taking? Authorizing Provider  acetaminophen (TYLENOL) 325 MG tablet Take 325 mg by mouth every 8 (eight) hours as needed for moderate pain.    Yes [provider]  atorvastatin (LIPITOR) 10 MG tablet Take 10 mg by mouth.  09/14/18 09/14/19 Yes [provider]  fluticasone (FLONASE) 50 MCG/ACT nasal spray daily as needed.  10/08/14  Yes [provider]  furosemide (LASIX) 20 MG tablet Take 20 mg by mouth daily.  10/13/17  Yes [provider]  loratadine (CLARITIN) 10 MG tablet Take 10 mg by mouth daily.   Yes [provider]  metoprolol tartrate (LOPRESSOR) 50 MG tablet Take 1 tablet (50 mg total) by mouth 2 (two) times daily. 10/03/18 03/14/19 Yes Strader, Fransisco Hertz, PA-C  pantoprazole (PROTONIX) 40 MG tablet Take 1 tablet (40 mg total) by mouth daily. 09/14/18  Yes Elgergawy, Silver Huguenin, MD  Pediatric Multivitamins-Iron (FLINTSTONES PLUS IRON) chewable tablet Chew 1 tablet by mouth 2 (two) times daily. 08/25/17  Yes Rehman, Mechele Dawley, MD  rivaroxaban (XARELTO) 20 MG TABS tablet Take 1 tablet (20 mg total) by mouth daily with supper. 08/28/17  Yes Rehman, Mechele Dawley, MD  tamsulosin (FLOMAX) 0.4 MG CAPS capsule Take by mouth. 01/15/19  Yes [provider]    Scheduled Meds: . aspirin  81 mg Per Tube Daily  . calcium acetate (Phos Binder)  667 mg Per Tube TID WC  . chlorhexidine  15 mL Mouth Rinse BID  . Chlorhexidine Gluconate Cloth  6 each Topical Daily  . darbepoetin (ARANESP) injection - DIALYSIS  200 mcg Intravenous Q Wed-HD  . famotidine  10 mg Per Tube Daily  . feeding supplement (PRO-STAT SUGAR FREE 64)  60 mL Per Tube BID  . Gerhardt's butt cream   Topical BID  . hydrocortisone   Rectal BID  . hydrOXYzine  10 mg Per Tube Q6H  .  insulin aspart  0-24 Units Subcutaneous Q4H  . insulin glargine  36 Units Subcutaneous Daily  . lipase/protease/amylase)  20,880 Units Per Tube Once   And  . sodium bicarbonate  650 mg Per Tube Once  . mouth rinse  15 mL Mouth Rinse q12n4p  . methylPREDNISolone (SOLU-MEDROL) injection  80 mg Intravenous Daily  . multivitamin  1 tablet Per Tube QHS  . nutrition supplement (JUVEN)  1 packet Per Tube BID BM  . sodium chloride flush  10-40 mL Intracatheter Q12H   Continuous Infusions: . sodium chloride    . sodium chloride    . sodium chloride Stopped (05/01/19 1307)  . alum bladder irrigation 300 mL/hr at 05/07/19 0748  . feeding supplement (JEVITY 1.5 CAL/FIBER) 1,000 mL (05/07/19 0427)  . ferric gluconate (FERRLECIT/NULECIT) IV    . norepinephrine (LEVOPHED) Adult infusion Stopped (04/24/19 1800)   PRN Meds:.sodium chloride, sodium chloride, oxyCODONE **AND** acetaminophen, alteplase, dextrose, hyoscyamine, levalbuterol, lidocaine (PF), lidocaine-prilocaine, loperamide HCl, ondansetron (ZOFRAN) IV, pentafluoroprop-tetrafluoroeth, promethazine,  Resource ThickenUp Clear, sodium chloride flush, sodium chloride flush  Allergies as of 03/13/2019  . (No Known Allergies)    Family History  Problem Relation Age of Onset  . CAD Mother 57  . CAD Father 91  . CAD Sister   . CAD Brother     Social History   Socioeconomic History  . Marital status: Married    Spouse name: Not on file  . Number of children: 2  . Years of education: Not on file  . Highest education level: Not on file  Occupational History  . Occupation: Librarian, academic in supply    Comment: [employer]  Tobacco Use  . Smoking status: Former Smoker    Packs/day: 0.25    Years: 50.00    Pack years: 12.50    Types: Cigarettes    Quit date: 02/15/1990    Years since quitting: 29.2  . Smokeless tobacco: Never Used  Substance and Sexual Activity  . Alcohol use: No  . Drug use: No  . Sexual activity: Not Currently  Other  Topics Concern  . Not on file  Social History Narrative  . Not on file   Social Determinants of Health   Financial Resource Strain:   . Difficulty of Paying Living Expenses:   Food Insecurity:   . Worried About Charity fundraiser in the Last Year:   . Arboriculturist in the Last Year:   Transportation Needs:   . Film/video editor (Medical):   Marland Kitchen Lack of Transportation (Non-Medical):   Physical Activity:   . Days of Exercise per Week:   . Minutes of Exercise per Session:   Stress:   . Feeling of Stress :   Social Connections:   . Frequency of Communication with Friends and Family:   . Frequency of Social Gatherings with Friends and Family:   . Attends Religious Services:   . Active Member of Clubs or Organizations:   . Attends Archivist Meetings:   Marland Kitchen Marital Status:   Intimate Partner Violence:   . Fear of Current or Ex-Partner:   . Emotionally Abused:   Marland Kitchen Physically Abused:   . Sexually Abused:     Review of Systems: All negative except as stated above in HPI.  Physical Exam: Vital signs: Vitals:   05/07/19 1100 05/07/19 1131  BP: (!) 112/46 (!) 88/47  Pulse: 79 87  Resp: 14 (!) 31  Temp:    SpO2: 95% (!) 89%  T 98  Last BM Date: 05/07/19 General:  Lethargic, elderly, frail, no acute distress   Head: normocephalic, atraumatic Eyes: anicteric sclera ENT: oropharynx clear Neck: supple, nontender Lungs:  Clear throughout to auscultation.   No wheezes, crackles, or rhonchi. No acute distress. Heart:  Regular rate and rhythm; no murmurs, clicks, rubs,  or gallops. Abdomen: soft, nontender, nondistended, +BS  Rectal:  Deferred Ext: no edema  GI:  Lab Results: Recent Labs    05/05/19 2001 05/06/19 0427 05/06/19 1723  WBC 13.2* 12.3*  --   HGB 8.7* 8.4* 8.5*  HCT 28.8* 27.7* 28.2*  PLT 187 186  --    BMET Recent Labs    05/05/19 0427 05/06/19 0427 05/07/19 0416  NA 133* 134* 133*  K 4.7 5.1 6.3*  CL 95* 97* 94*  CO2 28 27 27    GLUCOSE 122* 112* 188*  BUN 71* 98* 135*  CREATININE 3.79* 4.96* 5.87*  CALCIUM 7.9* 7.9* 8.5*   LFT Recent Labs    05/06/19 0427  05/06/19 0427 05/07/19 0416  PROT 5.4*  --   --   ALBUMIN 2.3*   < > 2.4*  AST 14*  --   --   ALT 7  --   --   ALKPHOS 74  --   --   BILITOT 0.5  --   --    < > = values in this interval not displayed.   PT/INR No results for input(s): LABPROT, INR in the last 72 hours.   Studies/Results: No results found.  Impression/Plan: Rectal bleeding in the setting of radiation proctitis and history of diverticulosis. I think his bleeding is likely due to his radiation proctitis and may need steroid suppositories if the bleeding continues. Dysphagia is likely due to an infectious esophagitis and doubt an esophageal stricture. Candida esophagitis would be high on my list as a possible source so will treat empirically with IV Diflucan and see if that helps his ability to eat. If no better in the next 48-72 hours, then may need an EGD to further evaluate.    LOS: 54 days   Lear Ng  05/07/2019, 11:43 AM  Questions please call [phone]

## 2019-05-07 NOTE — Progress Notes (Signed)
      [address]       [place] [address]             [phone]      HD # [phone]  No new events today  BP (!) 110/41   Pulse 74   Temp (!) 97.1 F (36.2 C) (Oral)   Resp 14   Ht 5\' 8"  (1.727 m)   Wt 88.1 kg   SpO2 98%   BMI 29.53 kg/m   Intake/Output Summary (Last 24 hours) at 05/07/2019 1739 Last data filed at 05/07/2019 1400 Gross per 24 hour  Intake 5230 ml  Output 11716 ml  Net -6486 ml   Hgb 26  Continue current Rx  Kendyl Bissonnette C. Roxan Hockey, MD [facility] [phone]

## 2019-05-07 NOTE — Progress Notes (Addendum)
T[facility] DAILY ICU PROGRESS NOTE                   [place]            [place] [address]          [phone]   7 Days Post-Op Procedure(s) (LRB): CYSTOSCOPY WITH FULGERATION (N/A)  Total Length of Stay:  LOS: 54 days   Subjective: GI bleeding being (rectal) being evaluated per GI consult  Objective: Vital signs in last 24 hours: Temp:  [97.1 F (36.2 C)-98.1 F (36.7 C)] 97.1 F (36.2 C) (03/22 1145) Pulse Rate:  [71-89] 85 (03/22 1145) Cardiac Rhythm: Normal sinus rhythm (03/22 1140) Resp:  [13-33] 33 (03/22 1145) BP: (88-144)/(40-64) 142/56 (03/22 1135) SpO2:  [88 %-100 %] 95 % (03/22 1145) Weight:  [88.1 kg] 88.1 kg (03/22 0500)  Filed Weights   05/04/19 1145 05/06/19 0500 05/07/19 0500  Weight: 86.5 kg 84.4 kg 88.1 kg    Weight change: 3.7 kg   Hemodynamic parameters for last 24 hours: CVP:  [6 mmHg-14 mmHg] 6 mmHg  Intake/Output from previous day: 03/21 0701 - 03/22 0700 In: 35573 [I.V.:10; NG/GT:715] Out: 7450 [Urine:7450]  Intake/Output this shift: Total I/O In: 0  Out: 1766 [Other:1766]  Current Meds: Scheduled Meds: . aspirin  81 mg Per Tube Daily  . calcium acetate (Phos Binder)  667 mg Per Tube TID WC  . chlorhexidine  15 mL Mouth Rinse BID  . Chlorhexidine Gluconate Cloth  6 each Topical Daily  . darbepoetin (ARANESP) injection - DIALYSIS  200 mcg Intravenous Q Wed-HD  . famotidine  10 mg Per Tube Daily  . feeding supplement (PRO-STAT SUGAR FREE 64)  60 mL Per Tube BID  . Gerhardt's butt cream   Topical BID  . hydrocortisone   Rectal BID  . hydrOXYzine  10 mg Per Tube Q6H  . insulin aspart  0-24 Units Subcutaneous Q4H  . insulin glargine  36 Units Subcutaneous Daily  . lipase/protease/amylase)  20,880 Units Per Tube Once   And  . sodium bicarbonate  650 mg Per Tube Once  . mouth rinse  15 mL Mouth Rinse q12n4p  . methylPREDNISolone (SOLU-MEDROL) injection  80 mg Intravenous Daily  . multivitamin  1 tablet Per Tube QHS   . nutrition supplement (JUVEN)  1 packet Per Tube BID BM  . sodium chloride flush  10-40 mL Intracatheter Q12H   Continuous Infusions: . sodium chloride    . sodium chloride    . sodium chloride Stopped (05/01/19 1307)  . alum bladder irrigation 300 mL/hr at 05/07/19 0748  . feeding supplement (JEVITY 1.5 CAL/FIBER) 1,000 mL (05/07/19 0427)  . ferric gluconate (FERRLECIT/NULECIT) IV 125 mg (05/07/19 1240)  . [START ON 05/08/2019] fluconazole (DIFLUCAN) IV    . fluconazole (DIFLUCAN) IV    . norepinephrine (LEVOPHED) Adult infusion Stopped (04/24/19 1800)   PRN Meds:.sodium chloride, sodium chloride, oxyCODONE **AND** acetaminophen, alteplase, dextrose, hyoscyamine, levalbuterol, lidocaine (PF), lidocaine-prilocaine, loperamide HCl, ondansetron (ZOFRAN) IV, pentafluoroprop-tetrafluoroeth, promethazine, Resource ThickenUp Clear, sodium chloride flush, sodium chloride flush  General appearance: alert, cooperative, fatigued and no distress Heart: regular rate and rhythm Lungs: dim in bases Abdomen: soft, minor tenderness Extremities: no significant edema Wound: incis healing well  Lab Results: CBC: Recent Labs    05/06/19 0427 05/06/19 0427 05/06/19 1723 05/07/19 1230  WBC 12.3*  --   --  12.7*  HGB 8.4*   < > 8.5* 7.9*  HCT 27.7*   < >  28.2* 26.4*  PLT 186  --   --  198   < > = values in this interval not displayed.   BMET:  Recent Labs    05/06/19 0427 05/07/19 0416  NA 134* 133*  K 5.1 6.3*  CL 97* 94*  CO2 27 27  GLUCOSE 112* 188*  BUN 98* 135*  CREATININE 4.96* 5.87*  CALCIUM 7.9* 8.5*    CMET: Lab Results  Component Value Date   WBC 12.7 (H) 05/07/2019   HGB 7.9 (L) 05/07/2019   HCT 26.4 (L) 05/07/2019   PLT 198 05/07/2019   GLUCOSE 188 (H) 05/07/2019   CHOL 117 03/16/2019   TRIG 52 03/16/2019   HDL 33 (L) 03/16/2019   LDLCALC 74 03/16/2019   ALT 7 05/06/2019   AST 14 (L) 05/06/2019   NA 133 (L) 05/07/2019   K 6.3 (HH) 05/07/2019   CL 94 (L)  05/07/2019   CREATININE 5.87 (H) 05/07/2019   BUN 135 (H) 05/07/2019   CO2 27 05/07/2019   TSH 3.683 03/13/2019   INR 1.7 (H) 03/27/2019   HGBA1C 6.1 (H) 03/13/2019      PT/INR: No results for input(s): LABPROT, INR in the last 72 hours. Radiology: No results found.   Assessment/Plan: S/P Procedure(s) (LRB): CYSTOSCOPY WITH FULGERATION (N/A)   1 hemodyn stable in sinus rhythm 2 rectal bleeding from radiation proctitis, esophagitis- plans as outlined per GI medicine- monitoring H/H closely 3 AHF/nephrology managing volume status, iHD, Co-ox 72 yesterday  4 sats ok on 4 liters O2 5 urology assisting with hematuria/radiation cystitis- alum irrigation/CBI 6 mild leukocytosis- on steroids 7 cont TF's 8 therapies as able John Giovanni PA-C Pager [phone] 05/07/2019 1:49 PM  Some rectal bleeding prob related to prostate seed irradiation in past Will need to stay in ICU for continuous bladder irrigation Goal is CIR when feeding tube, cont bladder irrigation are out  patient examined and medical record reviewed,agree with above note. Tharon Aquas Trigt III 05/07/2019

## 2019-05-08 LAB — RENAL FUNCTION PANEL
Albumin: 2.3 g/dL — ABNORMAL LOW (ref 3.5–5.0)
Anion gap: 10 (ref 5–15)
BUN: 95 mg/dL — ABNORMAL HIGH (ref 8–23)
CO2: 27 mmol/L (ref 22–32)
Calcium: 8.2 mg/dL — ABNORMAL LOW (ref 8.9–10.3)
Chloride: 98 mmol/L (ref 98–111)
Creatinine, Ser: 4.14 mg/dL — ABNORMAL HIGH (ref 0.61–1.24)
GFR calc Af Amer: 14 mL/min — ABNORMAL LOW (ref >60)
GFR calc non Af Amer: 12 mL/min — ABNORMAL LOW (ref >60)
Glucose, Bld: 166 mg/dL — ABNORMAL HIGH (ref 70–99)
Phosphorus: 3.2 mg/dL (ref 2.5–4.6)
Potassium: 5.4 mmol/L — ABNORMAL HIGH (ref 3.5–5.1)
Sodium: 135 mmol/L (ref 135–145)

## 2019-05-08 LAB — CBC
HCT: 24.3 % — ABNORMAL LOW (ref 39.0–52.0)
Hemoglobin: 7.1 g/dL — ABNORMAL LOW (ref 13.0–17.0)
MCH: 29.7 pg (ref 26.0–34.0)
MCHC: 29.2 g/dL — ABNORMAL LOW (ref 30.0–36.0)
MCV: 101.7 fL — ABNORMAL HIGH (ref 80.0–100.0)
Platelets: 205 10*3/uL (ref 150–400)
RBC: 2.39 MIL/uL — ABNORMAL LOW (ref 4.22–5.81)
RDW: 17.7 % — ABNORMAL HIGH (ref 11.5–15.5)
WBC: 13.7 10*3/uL — ABNORMAL HIGH (ref 4.0–10.5)
nRBC: 0.6 % — ABNORMAL HIGH (ref 0.0–0.2)

## 2019-05-08 LAB — GLUCOSE, CAPILLARY
Glucose-Capillary: 148 mg/dL — ABNORMAL HIGH (ref 70–99)
Glucose-Capillary: 162 mg/dL — ABNORMAL HIGH (ref 70–99)
Glucose-Capillary: 172 mg/dL — ABNORMAL HIGH (ref 70–99)
Glucose-Capillary: 173 mg/dL — ABNORMAL HIGH (ref 70–99)
Glucose-Capillary: 178 mg/dL — ABNORMAL HIGH (ref 70–99)
Glucose-Capillary: 198 mg/dL — ABNORMAL HIGH (ref 70–99)

## 2019-05-08 LAB — PREPARE RBC (CROSSMATCH)

## 2019-05-08 LAB — HEMOGLOBIN AND HEMATOCRIT, BLOOD
HCT: 28.7 % — ABNORMAL LOW (ref 39.0–52.0)
Hemoglobin: 8.7 g/dL — ABNORMAL LOW (ref 13.0–17.0)

## 2019-05-08 LAB — MAGNESIUM: Magnesium: 2.2 mg/dL (ref 1.7–2.4)

## 2019-05-08 MED ORDER — ASPIRIN 81 MG PO CHEW
81.0000 mg | CHEWABLE_TABLET | Freq: Every day | ORAL | Status: DC
  Administered 2019-05-14 – 2019-05-22 (×9): 81 mg via ORAL
  Filled 2019-05-08 (×9): qty 1

## 2019-05-08 MED ORDER — SIMETHICONE 80 MG PO CHEW
80.0000 mg | CHEWABLE_TABLET | Freq: Four times a day (QID) | ORAL | Status: DC
  Administered 2019-05-08 – 2019-05-12 (×14): 80 mg via ORAL
  Filled 2019-05-08 (×14): qty 1

## 2019-05-08 MED ORDER — MAGIC MOUTHWASH
5.0000 mL | Freq: Three times a day (TID) | ORAL | Status: DC
  Administered 2019-05-08 – 2019-05-20 (×23): 5 mL via ORAL
  Filled 2019-05-08 (×40): qty 5

## 2019-05-08 MED ORDER — SODIUM CHLORIDE 0.9% FLUSH
10.0000 mL | Freq: Two times a day (BID) | INTRAVENOUS | Status: DC
  Administered 2019-05-08 – 2019-05-11 (×5): 10 mL
  Administered 2019-05-12: 20 mL
  Administered 2019-05-13: 10 mL
  Administered 2019-05-13 – 2019-05-14 (×2): 20 mL
  Administered 2019-05-15 – 2019-05-22 (×13): 10 mL

## 2019-05-08 MED ORDER — HYDROCORTISONE ACETATE 25 MG RE SUPP
25.0000 mg | Freq: Two times a day (BID) | RECTAL | Status: DC
  Administered 2019-05-09 – 2019-05-21 (×14): 25 mg via RECTAL
  Filled 2019-05-08 (×27): qty 1

## 2019-05-08 MED ORDER — INSULIN ASPART 100 UNIT/ML ~~LOC~~ SOLN
0.0000 [IU] | Freq: Three times a day (TID) | SUBCUTANEOUS | Status: DC
  Administered 2019-05-08 – 2019-05-09 (×3): 3 [IU] via SUBCUTANEOUS
  Administered 2019-05-09: 2 [IU] via SUBCUTANEOUS
  Administered 2019-05-09 – 2019-05-10 (×2): 3 [IU] via SUBCUTANEOUS
  Administered 2019-05-11: 2 [IU] via SUBCUTANEOUS
  Administered 2019-05-12: 5 [IU] via SUBCUTANEOUS
  Administered 2019-05-12: 3 [IU] via SUBCUTANEOUS
  Administered 2019-05-12: 8 [IU] via SUBCUTANEOUS
  Administered 2019-05-13 – 2019-05-14 (×2): 3 [IU] via SUBCUTANEOUS
  Administered 2019-05-14: 2 [IU] via SUBCUTANEOUS
  Administered 2019-05-14: 3 [IU] via SUBCUTANEOUS
  Administered 2019-05-15: 2 [IU] via SUBCUTANEOUS
  Administered 2019-05-15 – 2019-05-18 (×2): 3 [IU] via SUBCUTANEOUS
  Administered 2019-05-22: 2 [IU] via SUBCUTANEOUS

## 2019-05-08 MED ORDER — SODIUM ZIRCONIUM CYCLOSILICATE 5 G PO PACK
5.0000 g | PACK | Freq: Once | ORAL | Status: AC
  Administered 2019-05-08: 5 g via ORAL
  Filled 2019-05-08: qty 1

## 2019-05-08 MED ORDER — INSULIN ASPART 100 UNIT/ML ~~LOC~~ SOLN
0.0000 [IU] | Freq: Every day | SUBCUTANEOUS | Status: DC
  Administered 2019-05-10: 2 [IU] via SUBCUTANEOUS

## 2019-05-08 MED ORDER — SODIUM CHLORIDE 0.9% FLUSH
10.0000 mL | INTRAVENOUS | Status: DC | PRN
  Administered 2019-05-14: 20 mL
  Administered 2019-05-15: 10 mL

## 2019-05-08 NOTE — Progress Notes (Addendum)
8 Days Post-Op Procedure(s) (LRB): CYSTOSCOPY WITH FULGERATION (N/A) Subjective: Stable nite Bleeding from radiation proctitis and cyctitis Will start steroid supposiyory L pleutx cath drain Mon and Thurs  Objective: Vital signs in last 24 hours: Temp:  [97.1 F (36.2 C)-98.1 F (36.7 C)] 97.8 F (36.6 C) (03/23 0810) Pulse Rate:  [71-87] 79 (03/23 0600) Cardiac Rhythm: Normal sinus rhythm (03/23 0400) Resp:  [12-33] 16 (03/23 0600) BP: (88-142)/(41-56) 120/52 (03/23 0600) SpO2:  [89 %-100 %] 100 % (03/23 0600) Weight:  [87.5 kg] 87.5 kg (03/23 0400)  Hemodynamic parameters for last 24 hours: CVP:  [5 mmHg-7 mmHg] 7 mmHg  Intake/Output from previous day: 03/22 0701 - 03/23 0700 In: 7020.4 [P.O.:120; NG/GT:605; IV Piggyback:195.4] Out: 13366 [Urine:10300; Emesis/NG output:300; Chest Tube:1000] Intake/Output this shift: No intake/output data recorded.       Exam    General- alert and comfortable    Neck- no JVD, no cervical adenopathy palpable, no carotid bruit   Lungs- clear without rales, wheezes   Cor- regular rate and rhythm, no murmur , gallop   Abdomen- soft, non-tender   Extremities - warm, non-tender, minimal edema   Neuro- oriented, appropriate, no focal weakness   Lab Results: Recent Labs    05/07/19 2159 05/08/19 0248  WBC 11.0* 13.7*  HGB 7.3* 7.1*  HCT 24.7* 24.3*  PLT 197 205   BMET:  Recent Labs    05/07/19 0416 05/08/19 0248  NA 133* 135  K 6.3* 5.4*  CL 94* 98  CO2 27 27  GLUCOSE 188* 166*  BUN 135* 95*  CREATININE 5.87* 4.14*  CALCIUM 8.5* 8.2*    PT/INR: No results for input(s): LABPROT, INR in the last 72 hours. ABG    Component Value Date/Time   PHART 7.488 (H) 04/07/2019 0405   HCO3 25.9 04/07/2019 0405   TCO2 36 (H) 04/23/2019 0449   ACIDBASEDEF 1.0 04/05/2019 0540   O2SAT 72.4 05/06/2019 0418   CBG (last 3)  Recent Labs    05/08/19 0016 05/08/19 0436 05/08/19 0808  GLUCAP 172* 162* 148*     Assessment/Plan: S/P Procedure(s) (LRB): CYSTOSCOPY WITH FULGERATION (N/A) Mobilize increase po intake so TF can stop  L pleurx drained yesterday - 1 liter Stop ASA for 5 days to help stop rectal, bladder bleeding Transfuse for Hb 7.1   LOS: 55 days    Tharon Aquas Trigt III 05/08/2019

## 2019-05-08 NOTE — Progress Notes (Signed)
OP HD referral completed to [facility] Admissions to request treatment at [facility]. Renal Navigator will follow.  Alphonzo Cruise, [place] Renal Navigator [phone]

## 2019-05-08 NOTE — Progress Notes (Signed)
Patient ID: Jesse Murphy, male   DOB: 06/04/1928, 86 y.o.   MRN: [id number] [facility] Evening Rounds:  Hemodynamically stable in sinus rhythm.  sats 100%  Continuing bladder irrigation.  Ambulated twice today.

## 2019-05-08 NOTE — Progress Notes (Signed)
  Speech Language Pathology Treatment: Dysphagia  Patient Details Name: Jesse Murphy MRN: [id number] DOB: January 18, 1929 Today's Date: 05/08/2019 Time: 4696-2952 SLP Time Calculation (min) (ACUTE ONLY): 15 min  Assessment / Plan / Recommendation Clinical Impression  Pt participated in swallowing therapy. Son present.  Reviewed with him images from last MBS on 3/16 - discussed pt's waxing/waning progress with swallowing; he verbalized understanding.  Provided with Threshold PEP for respiratory muscle training to address swallowing.  Pt subjectively reported acceptable resistance at 17 cm H20 - he engaged in two sets of five trials and was encouraged to practice five times/day.  Pt required visual/verbal cues to execute breaths appropriately.  SLP will continue to follow.    HPI HPI: 86 y.o. male with PMH: h/o CAD s/p CABG and clipping of left atrial appendage, who presented with SOB on 2/1. He became hypoxic on 2/2 with pulmonary edema and needed to be intubated on 2/2 and extubated 2/5. Reintubated 2/8.  Pt underwent impella placement and tracheostomy on 2/9. Pt started on CRRT 2/11. Impella removal 2/23. Pt decannulated 3/13.  MBS 3/16 with penetration of nectar to the vocal folds without sensation.        SLP Plan  Continue with current plan of care       Recommendations  Diet recommendations: Dysphagia 1 (puree);Honey-thick liquid Liquids provided via: Cup;Straw Medication Administration: Whole meds with puree Supervision: Full supervision/cueing for compensatory strategies;Staff to assist with self feeding Compensations: Slow rate;Small sips/bites                Oral Care Recommendations: Oral care BID Follow up Recommendations: Skilled Nursing facility SLP Visit Diagnosis: Dysphagia, oropharyngeal phase (R13.12) Plan: Continue with current plan of care       GO              Jesse Murphy L. Jesse Murphy, [place] Office number [phone] Pager  [phone]   Jesse Murphy 05/08/2019, 4:54 PM

## 2019-05-08 NOTE — Progress Notes (Signed)
[facility] Progress Note    Assessment/ Plan:   1. AKI(Cr was 0.7 prior to surgery)- due to postoperative CHF/cardiogenic - started on CRRT  03/29/19 - 2/24. Attempted IHD on 2/26 with minimal UF --> increased pressors and rapid afib.  Then CRRT on 3/1-- 3/8 then switched back to IHD which he is tolerating. Tolerating IHD.  MWF schedule - HD now.  Has been vein mapped; will see how he's doing this week prior to consulting [facility].   Has been getting 2.5L off per rx, 1.7L 3/22.  No heparin in rx d/t hematuria.  Plan next HD 3/24.  He doesn't appear to be recovering renal function and after 6 weeks of dialysis dependence I will consider him ESRD.  Given lokelma 5g this AM for K 5.4.  2. Acute systolic CHF/cardiogenic shock- EF 25-30%; s/p Impella- now out. optimize volume as above. Off midodrine  15 mg TID now due to c/f drug rash.  BPs currently reasonable - will follow BP with HD.   3. CAD s/p [id number] 3/87/56 complicated by cardiogenic shock and ARF.Postop echo EF 25-30  4. Acute hypoxic respiratory failure with pseudomonas pneumonia- s/p trach on 03/27/19.s/pabx per PCCM-> completed abx.  Decannulated  5. 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    6. ABLA- Hx Melen and hematuria. Transfuse as needed.Off aranesp now due to concern for drug rash etiology.  % sat 11, feraheme x 1 and then can get the rest of his iron load with HD--> ordered  7. Protein malnutrition- moderate to severe- alb 2.0  8. Secondary hyperparathyroidism/bone mineral -  phos rising,s/p phoslo -.  pth 116  9. Rash: looks like drug reaction--> emollients/ hydroxyzine prn, ? Abx, amio discontinued without improvement.  Concern midodrine vs aranesp.  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  On steroids now.   10.  Bloody bowel movement- GI consulted. 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   11.  Disposition-  trach is  out--> this was a huge step re: outpatient HD candidacy.  Will see how he does throughout week and if continues to improve will consider perm access (Ie AVF/AVG)--> only has L IJ TDC for now  Subjective:    HD yest AM.  UF 1.7L CBI running (alum now) yesterday but appears paused this AM. Blood BMs yesterday, Hb 8.5 > 7.1 this AM.  Gi consulted yesterday dysphagia and rectal bleeding. He says no new complaints this AM.  K 5.4 this AM    Objective:   BP (!) 120/52   Pulse 79   Temp 97.8 F (36.6 C)   Resp 16   Ht 5\' 8"  (1.727 m)   Wt 87.5 kg   SpO2 100%   BMI 29.33 kg/m   Intake/Output Summary (Last 24 hours) at 05/08/2019 4332 Last data filed at 05/08/2019 0600 Gross per 24 hour  Intake 6720.42 ml  Output 12766 ml  Net -6045.58 ml   Weight change: -0.6 kg  Physical Exam: Gen: older gentleman, NAD, calm in bed NECK: trach site dressed CVS: RRR Resp: clear anteriorly, bases diminished Abd: soft Ext: trace dependent and flank edema, improving, lacy rash/ erythematous over bilateral legs, some on stomach, improving ACCESS: L IJ TDC  Imaging: No results found.  Labs: BMET Recent Labs  Lab 05/02/19 0425 05/03/19 0441 05/04/19 0429 05/05/19 0427 05/06/19 0427 05/07/19 0416 05/08/19 0248  NA 137 138 133* 133* 134* 133* 135  K 5.2* 4.8 5.2* 4.7 5.1 6.3*  5.4*  CL 98 98 93* 95* 97* 94* 98  CO2 25 29 26 28 27 27 27   GLUCOSE 94 125* 156* 122* 112* 188* 166*  BUN 79* 50* 88* 71* 98* 135* 95*  CREATININE 5.04* 3.83* 4.95* 3.79* 4.96* 5.87* 4.14*  CALCIUM 8.2* 8.4* 7.9* 7.9* 7.9* 8.5* 8.2*  PHOS 5.7* 4.7* 4.5 3.0 3.5 3.0 3.2   CBC Recent Labs  Lab 05/06/19 0427 05/06/19 0427 05/06/19 1723 05/07/19 1230 05/07/19 2159 05/08/19 0248  WBC 12.3*  --   --  12.7* 11.0* 13.7*  HGB 8.4*   < > 8.5* 7.9* 7.3* 7.1*  HCT 27.7*   < > 28.2* 26.4* 24.7* 24.3*  MCV 97.5  --   --  98.5 99.2 101.7*  PLT 186  --   --  198 197 205   < > = values in this interval not displayed.     Medications:    . aspirin  81 mg Per Tube Daily  . calcium acetate (Phos Binder)  667 mg Per Tube TID WC  . chlorhexidine  15 mL Mouth Rinse BID  . Chlorhexidine Gluconate Cloth  6 each Topical Daily  . famotidine  10 mg Per Tube Daily  . feeding supplement (PRO-STAT SUGAR FREE 64)  60 mL Per Tube BID  . Gerhardt's butt cream   Topical BID  . hydrocortisone   Rectal BID  . hydrOXYzine  10 mg Per Tube Q6H  . insulin aspart  0-24 Units Subcutaneous Q4H  . insulin glargine  36 Units Subcutaneous Daily  . lipase/protease/amylase)  20,880 Units Per Tube Once   And  . sodium bicarbonate  650 mg Per Tube Once  . mouth rinse  15 mL Mouth Rinse q12n4p  . methylPREDNISolone (SOLU-MEDROL) injection  80 mg Intravenous Daily  . multivitamin  1 tablet Per Tube QHS  . nutrition supplement (JUVEN)  1 packet Per Tube BID BM  . pantoprazole sodium  40 mg Per Tube Q1200  . sodium chloride flush  10-40 mL Intracatheter Q12H     Jannifer Hick MD [facility] Pager [phone]

## 2019-05-08 NOTE — Progress Notes (Signed)
Physical Therapy Treatment Patient Details Name: Jesse Murphy MRN: [id number] DOB: 1928/10/08 Today's Date: 05/08/2019    History of Present Illness 86 y.o. male with PMH: h/o CAD s/p CABG and clipping of left atrial appendage, who presented with SOB on 2/1. He became hypoxic on 2/2 with pulmonary edema and needed to be intubated on 2/2 and extubated 2/5. Pt underwent impella placement and tracheostomy on 2/9. Pt started on CRRT 2/11. Impella removal and tracheostomy 2/23. CRRT restarted 3/1 as pt could not tolerate HD. Pt on continuous bladder irrigation.s/p cystoscopy with evacuation of clot and fulgoration of bladder neck bleeders 3/15.    PT Comments    Patient progressing well towards PT goals. Continues to require Mod A of 2 to stand from all surfaces. Pt now allowed to minimally use UEs for transfers per cardiothoracic MD. Tolerated gait training with use of EVA and min A; fatigued today requiring longer seated rest break. VSS on 4L/min 02 [place]. Will continue to follow.    Follow Up Recommendations  CIR     Equipment Recommendations  Other (comment)(defer)    Recommendations for Other Services       Precautions / Restrictions Precautions Precautions: Fall;Sternal Precaution Booklet Issued: No Precaution Comments: NG tube Restrictions Weight Bearing Restrictions: Yes RUE Weight Bearing: Partial weight bearing RUE Partial Weight Bearing Percentage or Pounds: Can place ~20lbs through his UEs for mobility per MD Other Position/Activity Restrictions: sternal precautions    Mobility  Bed Mobility Overal bed mobility: Needs Assistance Bed Mobility: Supine to Sit     Supine to sit: Mod assist;HOB elevated     General bed mobility comments: Assist to elevate trunk and scoot bottom to EOB.  Transfers Overall transfer level: Needs assistance Equipment used: (EVA walker) Transfers: Sit to/from Stand Sit to Stand: Mod assist;+2 physical assistance;+2 safety/equipment         General transfer comment: Assist to bring hips up and for balance. Use of momentum to stand from EOB x1, from chair x1.  Ambulation/Gait Ambulation/Gait assistance: Min assist;+2 safety/equipment Gait Distance (Feet): 60 Feet(x2 bouts) Assistive device: (EVA walker) Gait Pattern/deviations: Step-through pattern;Decreased step length - right;Decreased step length - left Gait velocity: decr   General Gait Details: Slow, mildly unsteady gait with EVA for support; 2/4 DOE. Pt on 4L/min 02 [place] and Sp02 in 90s throughout. 1 seated rest break.   Stairs             Wheelchair Mobility    Modified Rankin (Stroke Patients Only)       Balance Overall balance assessment: Needs assistance Sitting-balance support: Feet supported;No upper extremity supported Sitting balance-Leahy Scale: Fair     Standing balance support: During functional activity Standing balance-Leahy Scale: Poor Standing balance comment: Eva walker for support and Min gaurd-Min A                            Cognition Arousal/Alertness: Awake/alert Behavior During Therapy: Flat affect Overall Cognitive Status: No family/caregiver present to determine baseline cognitive functioning                                 General Comments: Follows 1-2 step commands.      Exercises      General Comments General comments (skin integrity, edema, etc.): RN assisting with lines.      Pertinent Vitals/Pain Pain Assessment: No/denies pain    Home  Living                      Prior Function            PT Goals (current goals can now be found in the care plan section) Progress towards PT goals: Progressing toward goals    Frequency    Min 3X/week      PT Plan Current plan remains appropriate    Co-evaluation              AM-PAC PT "6 Clicks" Mobility   Outcome Measure  Help needed turning from your back to your side while in a flat bed without using bedrails?:  A Little Help needed moving from lying on your back to sitting on the side of a flat bed without using bedrails?: A Lot Help needed moving to and from a bed to a chair (including a wheelchair)?: A Lot Help needed standing up from a chair using your arms (e.g., wheelchair or bedside chair)?: A Lot Help needed to walk in hospital room?: A Little Help needed climbing 3-5 steps with a railing? : Total 6 Click Score: 13    End of Session Equipment Utilized During Treatment: Gait belt;Oxygen Activity Tolerance: Patient tolerated treatment well Patient left: in chair;with call bell/phone within reach;with chair alarm set Nurse Communication: Mobility status PT Visit Diagnosis: Other abnormalities of gait and mobility (R26.89);Muscle weakness (generalized) (M62.81)     Time: 7116-5790 PT Time Calculation (min) (ACUTE ONLY): 31 min  Charges:  $Gait Training: 8-22 mins $Therapeutic Activity: 8-22 mins                     Marisa Severin, PT, DPT Acute Rehabilitation Services Pager [phone] Office [place] 05/08/2019, 1:09 PM

## 2019-05-08 NOTE — Progress Notes (Signed)
[facility]astroenterology Progress Note  JOHNLUKE HAUGEN 86 y.o. Jan 30, 1929   ABDULMALIK DARCO is a 86 y.o. male s/p CABG and pneumonia with history of radiation proctitis seen for rectal bleeding and dysphagia.   Subjective: Lying down in bedside chair. Walked with PT earlier this morning. Bloody stools X 4 overnight (the first 3 were frank blood and the last one was blood mixed in the stool) and none today thus far. Swallowing has improved and his dysphagia is approximately 50% better since initiation of fluconazole. Denies abdominal pain.  Objective: Vital signs: Vitals:   05/08/19 0600 05/08/19 0810  BP: (!) 120/52   Pulse: 79   Resp: 16   Temp:  97.8 F (36.6 C)  SpO2: 100%     Physical Exam: Gen: lethargic, elderly, no acute distress  HEENT: anicteric sclera, conjunctival pallor CV: RRR Chest: CTA B Abd: soft, nontender, nondistended, normoactive bowel sounds Ext: no edema  Lab Results: Recent Labs    05/07/19 0416 05/08/19 0248  NA 133* 135  K 6.3* 5.4*  CL 94* 98  CO2 27 27  GLUCOSE 188* 166*  BUN 135* 95*  CREATININE 5.87* 4.14*  CALCIUM 8.5* 8.2*  MG 2.4 2.2  PHOS 3.0 3.2   Recent Labs    05/06/19 0427 05/06/19 0427 05/07/19 0416 05/08/19 0248  AST 14*  --   --   --   ALT 7  --   --   --   ALKPHOS 74  --   --   --   BILITOT 0.5  --   --   --   PROT 5.4*  --   --   --   ALBUMIN 2.3*   < > 2.4* 2.3*   < > = values in this interval not displayed.   Recent Labs    05/07/19 2159 05/08/19 0248  WBC 11.0* 13.7*  HGB 7.3* 7.1*  HCT 24.7* 24.3*  MCV 99.2 101.7*  PLT 197 205      Assessment/Plan: Rectal bleeding in the setting of radiation proctitis and history of diverticulosis.  Suspect bleeding is secondary to radiation proctitis. Since bleeding has subsided today will hold off on rectal suppositories but if bleeding recurs then that would be the next step. Continue IV Diflucan for dysphagia and manage conservatively. No plans for EGD at this time.  Will follow.  Lear Ng 05/08/2019, 9:19 AM  Questions please call [phone]

## 2019-05-08 NOTE — Progress Notes (Signed)
Patient ID: Jesse Murphy, male   DOB: 15-Sep-1928, 86 y.o.   MRN: [id number] f    Advanced Heart Failure Rounding Note  PCP-Cardiologist: Kate Sable, MD   Subjective:    Events - Impella removed 2/23. Intraoperative TEE showed EF ~40% - Had tunneled cath placed 3/9 and left PleurX tube.  - Tolerated iHD on 3/10 and 3/12 - Trach decannulated 3/13 - Cystoscopy 3/15 with radiation cystitis -> fulgeration - Tolerated iHD on 3/17 and received PRBCs for hgB 6.5   Still having some rectal bleeding as well as hematuria. Now getting aluminum irrigation in CBI. Tolerated IHD yesterday off midodrine. Rash improving on steroids.   Seen by GI and started on IV fluconazole for presumed candidal esophagitis.   Objective:   Weight Range: 87.5 kg Body mass index is 29.33 kg/m.   Vital Signs:   Temp:  [97.1 F (36.2 C)-98.1 F (36.7 C)] 97.8 F (36.6 C) (03/23 0810) Pulse Rate:  [71-87] 79 (03/23 0900) Resp:  [12-33] 21 (03/23 0900) BP: (110-130)/(41-52) 114/44 (03/23 0900) SpO2:  [93 %-100 %] 100 % (03/23 0900) Weight:  [87.5 kg] 87.5 kg (03/23 0400) Last BM Date: 05/07/19  Weight change: Filed Weights   05/06/19 0500 05/07/19 0500 05/08/19 0400  Weight: 84.4 kg 88.1 kg 87.5 kg    Intake/Output:   Intake/Output Summary (Last 24 hours) at 05/08/2019 1140 Last data filed at 05/08/2019 0600 Gross per 24 hour  Intake 6720.42 ml  Output 11000 ml  Net -4279.58 ml     Physical Exam   General:  Sitting in chair  No resp difficulty HEENT: normal Neck: supple. JVP 6-7 trach site healed. Carotids 2+ bilat; no bruits. No lymphadenopathy or thryomegaly appreciated. + LIJ tunneled cath Cor: PMI nondisplaced. Regular rate & rhythm. No rubs, gallops or murmurs.  Lungs: clear dull at bases Abdomen: soft, nontender, nondistended. No hepatosplenomegaly. No bruits or masses. Good bowel sounds. Extremities: no cyanosis, clubbing, tr edema. Improving diffuse mp rash Neuro: alert &  orientedx3, cranial nerves grossly intact. moves all 4 extremities w/o difficulty. Affect pleasant    Telemetry    NSR 70s Personally reviewed   Labs    CBC Recent Labs    05/07/19 2159 05/08/19 0248  WBC 11.0* 13.7*  HGB 7.3* 7.1*  HCT 24.7* 24.3*  MCV 99.2 101.7*  PLT 197 852   Basic Metabolic Panel Recent Labs    05/07/19 0416 05/08/19 0248  NA 133* 135  K 6.3* 5.4*  CL 94* 98  CO2 27 27  GLUCOSE 188* 166*  BUN 135* 95*  CREATININE 5.87* 4.14*  CALCIUM 8.5* 8.2*  MG 2.4 2.2  PHOS 3.0 3.2   Liver Function Tests Recent Labs    05/06/19 0427 05/06/19 0427 05/07/19 0416 05/08/19 0248  AST 14*  --   --   --   ALT 7  --   --   --   ALKPHOS 74  --   --   --   BILITOT 0.5  --   --   --   PROT 5.4*  --   --   --   ALBUMIN 2.3*   < > 2.4* 2.3*   < > = values in this interval not displayed.   No results for input(s): LIPASE, AMYLASE in the last 72 hours. Cardiac Enzymes No results for input(s): CKTOTAL, CKMB, CKMBINDEX, TROPONINI in the last 72 hours.  BNP: BNP (last 3 results) Recent Labs    03/22/19 0401 03/26/19 0626 03/27/19  1255  BNP 340.4* 687.5* 800.4*    ProBNP (last 3 results) No results for input(s): PROBNP in the last 8760 hours.   D-Dimer No results for input(s): DDIMER in the last 72 hours. Hemoglobin A1C No results for input(s): HGBA1C in the last 72 hours. Fasting Lipid Panel No results for input(s): CHOL, HDL, LDLCALC, TRIG, CHOLHDL, LDLDIRECT in the last 72 hours. Thyroid Function Tests No results for input(s): TSH, T4TOTAL, T3FREE, THYROIDAB in the last 72 hours.  Invalid input(s): FREET3  Other results:   Imaging    No results found.   Medications:     Scheduled Medications: . calcium acetate (Phos Binder)  667 mg Per Tube TID WC  . chlorhexidine  15 mL Mouth Rinse BID  . Chlorhexidine Gluconate Cloth  6 each Topical Daily  . famotidine  10 mg Per Tube Daily  . feeding supplement (PRO-STAT SUGAR FREE 64)   60 mL Per Tube BID  . Gerhardt's butt cream   Topical BID  . hydrocortisone   Rectal BID  . hydrOXYzine  10 mg Per Tube Q6H  . insulin aspart  0-15 Units Subcutaneous TID WC  . insulin aspart  0-5 Units Subcutaneous QHS  . insulin glargine  36 Units Subcutaneous Daily  . lipase/protease/amylase)  20,880 Units Per Tube Once   And  . sodium bicarbonate  650 mg Per Tube Once  . magic mouthwash  5 mL Oral TID  . mouth rinse  15 mL Mouth Rinse q12n4p  . methylPREDNISolone (SOLU-MEDROL) injection  80 mg Intravenous Daily  . multivitamin  1 tablet Per Tube QHS  . nutrition supplement (JUVEN)  1 packet Per Tube BID BM  . simethicone  80 mg Oral QID  . sodium chloride flush  10-40 mL Intracatheter Q12H    Infusions: . sodium chloride    . sodium chloride    . sodium chloride Stopped (05/01/19 1307)  . alum bladder irrigation 300 mL/hr at 05/08/19 0629  . feeding supplement (JEVITY 1.5 CAL/FIBER) 1,000 mL (05/08/19 0247)  . ferric gluconate (FERRLECIT/NULECIT) IV Stopped (05/08/19 0025)  . fluconazole (DIFLUCAN) IV      PRN Medications: sodium chloride, sodium chloride, oxyCODONE **AND** acetaminophen, dextrose, hyoscyamine, levalbuterol, lidocaine (PF), lidocaine-prilocaine, loperamide HCl, ondansetron (ZOFRAN) IV, pentafluoroprop-tetrafluoroeth, promethazine, Resource ThickenUp Clear, sodium chloride flush, sodium chloride flush    Assessment/Plan   1. Acute systolic HF -> Cardiogenic shock - post-op echo on 03/20/19 EF 25-30% (pre-op 30-35%. In 12/2017 EF normal) - Required Impella support post CABG. Impella removed 2/23. Post-op TEE EF 40% - volume status slightly up . Managed with iHD - tolerating iHD. MWF Schedule. Now off midodrine due to rash (unclear which medication caused) - unable to tolerate GDMT with low BP and ESRD  2. CAD s/p CABG this admit (LIMA->LAD, SVG->OM, SVG->RCA on 03/15/19) - No s/s ischemia - Continue statin. Dr. Prescott Gum holding ASA for several days with  bleeding  3. Acute hypoxic respiratory failure with Pseudomonas PNA - s/p trach on 03/27/19 - completed meropenem for pseudomonas PNA - trach aspirate 3/5 w/ regrowth of Pseudomonas again.   - Completed antibiotic course.  -Trach decannulated 3/13. Stable on [place] - No change   4. PAF - s/p MAZE and LAA occlusion - Maintaining NSR.  - Off amio due to rash - No AC with ongoing hematuria  5. AKI  - due to shock/ATN - On CVVHD.  - Tunneled cath placed 3/9  - He is tolerating iHD. Midodrine now off. Continue to  follow closely  - may need lokelma for hyperkalemia  6. Hematuria -Ongoing.  Remains on CBI. -Cystoscopy 3/15 with fulgeration - Received 1UPRBC 05/02/19 HGb  6.5>8.5>8.4> 7.1 - Wil give another unit RBCs - off anticoagulation. - Urology starting aluminum irrigation   7. Debility, severe - continue PT/OT  8. F/E/N - Tube feeds per Cor-trak.     9. Left Pleural Effusion  - moderate to large. - s/p PleurX on 3/8.  - TCTS managing. Draining M/Th  10. Diffuse rash - Appearance of drug induced rash. - Steroids started 3/21 - Improved today - Can add needed meds back 1 by 1 watching for worsening   11. Anemia: - h/o melena and hematuria this admit. Now with copious BRBPR - check CBC - transfuse if < 7  12. Dysphagia and BRBPR - ? Possible esophagitis +/- radiation proctitis - GI following. On IV fluconazole. Will get EGD if no improvement in dysphagia  13. Hyperkalemia - HD MWF - May need lokelma today  Glori Bickers, MD  11:40 AM

## 2019-05-09 ENCOUNTER — Inpatient Hospital Stay (HOSPITAL_COMMUNITY): Payer: Medicare Other

## 2019-05-09 LAB — BPAM RBC
Blood Product Expiration Date: 202104112359
ISSUE DATE / TIME: 202103231607
Unit Type and Rh: 7300

## 2019-05-09 LAB — BASIC METABOLIC PANEL
Anion gap: 10 (ref 5–15)
BUN: 82 mg/dL — ABNORMAL HIGH (ref 8–23)
CO2: 31 mmol/L (ref 22–32)
Calcium: 8.5 mg/dL — ABNORMAL LOW (ref 8.9–10.3)
Chloride: 97 mmol/L — ABNORMAL LOW (ref 98–111)
Creatinine, Ser: 3.22 mg/dL — ABNORMAL HIGH (ref 0.61–1.24)
GFR calc Af Amer: 19 mL/min — ABNORMAL LOW (ref >60)
GFR calc non Af Amer: 16 mL/min — ABNORMAL LOW (ref >60)
Glucose, Bld: 146 mg/dL — ABNORMAL HIGH (ref 70–99)
Potassium: 3.7 mmol/L (ref 3.5–5.1)
Sodium: 138 mmol/L (ref 135–145)

## 2019-05-09 LAB — CBC
HCT: 28.9 % — ABNORMAL LOW (ref 39.0–52.0)
Hemoglobin: 8.7 g/dL — ABNORMAL LOW (ref 13.0–17.0)
MCH: 28.3 pg (ref 26.0–34.0)
MCHC: 30.1 g/dL (ref 30.0–36.0)
MCV: 94.1 fL (ref 80.0–100.0)
Platelets: 244 10*3/uL (ref 150–400)
RBC: 3.07 MIL/uL — ABNORMAL LOW (ref 4.22–5.81)
RDW: 23.9 % — ABNORMAL HIGH (ref 11.5–15.5)
WBC: 14 10*3/uL — ABNORMAL HIGH (ref 4.0–10.5)
nRBC: 0.5 % — ABNORMAL HIGH (ref 0.0–0.2)

## 2019-05-09 LAB — GLUCOSE, CAPILLARY
Glucose-Capillary: 179 mg/dL — ABNORMAL HIGH (ref 70–99)
Glucose-Capillary: 164 mg/dL — ABNORMAL HIGH (ref 70–99)
Glucose-Capillary: 122 mg/dL — ABNORMAL HIGH (ref 70–99)
Glucose-Capillary: 128 mg/dL — ABNORMAL HIGH (ref 70–99)
Glucose-Capillary: 52 mg/dL — ABNORMAL LOW (ref 70–99)

## 2019-05-09 LAB — TYPE AND SCREEN
ABO/RH(D): B POS
Antibody Screen: NEGATIVE
Unit division: 0

## 2019-05-09 LAB — MAGNESIUM: Magnesium: 2.3 mg/dL (ref 1.7–2.4)

## 2019-05-09 LAB — RENAL FUNCTION PANEL
Albumin: 2.6 g/dL — ABNORMAL LOW (ref 3.5–5.0)
Anion gap: 11 (ref 5–15)
BUN: 143 mg/dL — ABNORMAL HIGH (ref 8–23)
CO2: 26 mmol/L (ref 22–32)
Calcium: 8.5 mg/dL — ABNORMAL LOW (ref 8.9–10.3)
Chloride: 94 mmol/L — ABNORMAL LOW (ref 98–111)
Creatinine, Ser: 5.36 mg/dL — ABNORMAL HIGH (ref 0.61–1.24)
GFR calc Af Amer: 10 mL/min — ABNORMAL LOW (ref >60)
GFR calc non Af Amer: 9 mL/min — ABNORMAL LOW (ref >60)
Glucose, Bld: 194 mg/dL — ABNORMAL HIGH (ref 70–99)
Phosphorus: 3.6 mg/dL (ref 2.5–4.6)
Potassium: 6.4 mmol/L (ref 3.5–5.1)
Sodium: 131 mmol/L — ABNORMAL LOW (ref 135–145)

## 2019-05-09 MED ORDER — ALBUMIN HUMAN 25 % IV SOLN
25.0000 g | Freq: Once | INTRAVENOUS | Status: AC
  Administered 2019-05-09: 25 g via INTRAVENOUS
  Filled 2019-05-09: qty 100
  Filled 2019-05-09: qty 50

## 2019-05-09 MED ORDER — DEXTROSE 50 % IV SOLN
25.0000 mL | Freq: Once | INTRAVENOUS | Status: AC
  Administered 2019-05-09: 25 mL via INTRAVENOUS

## 2019-05-09 MED ORDER — GLUCERNA 1.5 CAL PO LIQD
237.0000 mL | Freq: Every day | ORAL | Status: DC
  Administered 2019-05-09 – 2019-05-10 (×6): 237 mL
  Filled 2019-05-09 (×9): qty 237

## 2019-05-09 MED ORDER — HEPARIN SODIUM (PORCINE) 1000 UNIT/ML IJ SOLN
4000.0000 [IU] | Freq: Once | INTRAMUSCULAR | Status: DC
  Filled 2019-05-09: qty 4

## 2019-05-09 MED ORDER — PRO-STAT SUGAR FREE PO LIQD
30.0000 mL | Freq: Two times a day (BID) | ORAL | Status: DC
  Administered 2019-05-09 – 2019-05-10 (×3): 30 mL
  Filled 2019-05-09 (×3): qty 30

## 2019-05-09 NOTE — Progress Notes (Signed)
[facility] Gastroenterology Progress Note  ROSTON GRUNEWALD 86 y.o. 1928-08-23   Subjective: Feels ok. No more rectal bleeding. Eating a little better and seen by speech path. Son in room.  Objective: Vital signs: Vitals:   05/09/19 1100 05/09/19 1200  BP: (!) 129/50 (!) 130/52  Pulse: 79 73  Resp: (!) 22 11  Temp: 97.9 F (36.6 C)   SpO2: 97% 100%    Physical Exam: Gen: lethargic, elderly, frail, no acute distress  HEENT: anicteric sclera CV: RRR Chest: CTA B Abd: soft, nontender, nondistended, +BS Ext: no edema  Lab Results: Recent Labs    05/08/19 0248 05/09/19 0340  NA 135 131*  K 5.4* 6.4*  CL 98 94*  CO2 27 26  GLUCOSE 166* 194*  BUN 95* 143*  CREATININE 4.14* 5.36*  CALCIUM 8.2* 8.5*  MG 2.2 2.3  PHOS 3.2 3.6   Recent Labs    05/08/19 0248 05/09/19 0340  ALBUMIN 2.3* 2.6*   Recent Labs    05/08/19 0248 05/08/19 0248 05/08/19 2030 05/09/19 0340  WBC 13.7*  --   --  14.0*  HGB 7.1*   < > 8.7* 8.7*  HCT 24.3*   < > 28.7* 28.9*  MCV 101.7*  --   --  94.1  PLT 205  --   --  244   < > = values in this interval not displayed.      Assessment/Plan: Rectal bleeding has resolved and was likely due to radiation proctitis. If rebleeding occurs, then would recommend steroid enemas. Dysphagia markedly better since IV Diflucan started. Would continue IV Diflucan for 5 days and then D/C. Supportive care. Will sign off. Call if questions.   Lear Ng 05/09/2019, 1:51 PM  Questions please call [phone] ID: KEITHAN DILEONARDO, male   DOB: 1928/05/21, 86 y.o.   MRN: [id number]

## 2019-05-09 NOTE — Progress Notes (Signed)
Occupational Therapy Treatment Patient Details Name: Jesse Murphy MRN: [id number] DOB: 03/26/1928 Today's Date: 05/09/2019    History of present illness 86 y.o. male with PMH: h/o CAD s/p CABG and clipping of left atrial appendage, who presented with SOB on 2/1. He became hypoxic on 2/2 with pulmonary edema and needed to be intubated on 2/2 and extubated 2/5. Pt underwent impella placement and tracheostomy on 2/9. Pt started on CRRT 2/11. Impella removal and tracheostomy 2/23. CRRT restarted 3/1 as pt could not tolerate HD. Pt on continuous bladder irrigation.s/p cystoscopy with evacuation of clot and fulgoration of bladder neck bleeders 3/15.   OT comments  Pt continuing to make progress toward OT goals. Focused session on functional mobility progression with ECS and toilet transfer. Pt completed bed mobility at min A and sit <> stands with mod A +2 and eva walker. Pt completed functional mobility a household distance at min guard- min A level for balance. Pt incontinent of stool during session, needing min A +2 to BSC. Pt required max A for peri hygiene due to needing BUE support to maintain standing balance. Encouraged increased use of BUEs for strengthening due to sternal precautions beginning to lift. D/c recs remain appropriate. Will continue to follow.   Follow Up Recommendations  CIR;Supervision/Assistance - 24 hour    Equipment Recommendations  Other (comment)(TBD)    Recommendations for Other Services Rehab consult    Precautions / Restrictions Precautions Precautions: Fall Restrictions RUE Weight Bearing: Partial weight bearing RUE Partial Weight Bearing Percentage or Pounds: Can place ~20lbs through his UEs for mobility per MD       Mobility Bed Mobility Overal bed mobility: Needs Assistance Bed Mobility: Supine to Sit     Supine to sit: Min assist     General bed mobility comments: min A to come to EOB, cues needed to best place BUEs and sequence  transfer  Transfers Overall transfer level: Needs assistance Equipment used: (EVA walker) Transfers: Sit to/from Stand Sit to Stand: Mod assist;+2 safety/equipment         General transfer comment: mod A +2 to rise and steady and boost hips up to standing position    Balance Overall balance assessment: Needs assistance Sitting-balance support: Feet supported;No upper extremity supported Sitting balance-Leahy Scale: Fair     Standing balance support: During functional activity Standing balance-Leahy Scale: Poor Standing balance comment: Harmon Pier walker for support plus therapist assist                           ADL either performed or assessed with clinical judgement   ADL Overall ADL's : Needs assistance/impaired             Lower Body Bathing: Maximal assistance;Sit to/from stand Lower Body Bathing Details (indicate cue type and reason): to wash legs after incontinence episode         Toilet Transfer: Minimal assistance;+2 for physical assistance;+2 for safety/equipment;RW Toilet Transfer Details (indicate cue type and reason): to [facility] after incontinence episode Toileting- Clothing Manipulation and Hygiene: Maximal assistance;Sit to/from stand       Functional mobility during ADLs: Minimal assistance;+2 for physical assistance;+2 for safety/equipment;Rolling walker       Vision       Perception     Praxis      Cognition Arousal/Alertness: Awake/alert Behavior During Therapy: Flat affect Overall Cognitive Status: No family/caregiver present to determine baseline cognitive functioning Area of Impairment: Problem solving  Problem Solving: Slow processing;Requires verbal cues General Comments: follows 1-2 step commands, needs increased time for problem solving        Exercises     Shoulder Instructions       General Comments      Pertinent Vitals/ Pain       Pain Assessment: No/denies pain  Home  Living                                          Prior Functioning/Environment              Frequency  Min 2X/week        Progress Toward Goals  OT Goals(current goals can now be found in the care plan section)  Progress towards OT goals: Progressing toward goals  Acute Rehab OT Goals Patient Stated Goal: get better OT Goal Formulation: With patient Time For Goal Achievement: 05/10/19 Potential to Achieve Goals: Good  Plan Discharge plan remains appropriate;Frequency remains appropriate    Co-evaluation    PT/OT/SLP Co-Evaluation/Treatment: Yes     OT goals addressed during session: ADL's and self-care;Proper use of Adaptive equipment and DME;Strengthening/ROM      AM-PAC OT "6 Clicks" Daily Activity     Outcome Measure   Help from another person eating meals?: A Little Help from another person taking care of personal grooming?: A Little Help from another person toileting, which includes using toliet, bedpan, or urinal?: A Lot Help from another person bathing (including washing, rinsing, drying)?: A Lot Help from another person to put on and taking off regular upper body clothing?: A Little Help from another person to put on and taking off regular lower body clothing?: A Lot 6 Click Score: 15    End of Session Equipment Utilized During Treatment: Oxygen;Gait belt  OT Visit Diagnosis: Unsteadiness on feet (R26.81);Other abnormalities of gait and mobility (R26.89);Muscle weakness (generalized) (M62.81)   Activity Tolerance Patient tolerated treatment well   Patient Left in bed;with call bell/phone within reach   Nurse Communication Mobility status        Time: 1740-8144 OT Time Calculation (min): 36 min  Charges: OT General Charges $OT Visit: 1 Visit OT Treatments $Self Care/Home Management : 8-22 mins  Zenovia Jarred, MSOT, OTR/L [place] [facility] Office Number: [phone] Pager: [phone]  Zenovia Jarred 05/09/2019, 1:22 PM

## 2019-05-09 NOTE — Progress Notes (Signed)
Physical Therapy Treatment Patient Details Name: Jesse Murphy MRN: [id number] DOB: [birth year] Today's Date: 05/09/2019    History of Present Illness 86 y.o. male with PMH: h/o CAD s/p CABG and clipping of left atrial appendage, who presented with SOB on 2/1. He became hypoxic on 2/2 with pulmonary edema and needed to be intubated on 2/2 and extubated 2/5. Pt underwent impella placement and tracheostomy on 2/9. Pt started on CRRT 2/11. Impella removal and tracheostomy 2/23. CRRT restarted 3/1 as pt could not tolerate HD. Pt on continuous bladder irrigation.s/p cystoscopy with evacuation of clot and fulgoration of bladder neck bleeders 3/15.    PT Comments    Pt admitted with above diagnosis. Pt was able to ambulate with eva walker and incr distance with mod to min assist of 2.  Progressing well. C/o dizziness but VSS.  On 2LO2.  Unaware of need for BM and had to be cleaned.  Will continue to progress pt.  Pt currently with functional limitations due to balance and endurance deficits. Pt will benefit from skilled PT to increase their independence and safety with mobility to allow discharge to the venue listed below.     Follow Up Recommendations  CIR     Equipment Recommendations  Other (comment)(defer)    Recommendations for Other Services Rehab consult     Precautions / Restrictions Precautions Precautions: Fall Precaution Booklet Issued: No Precaution Comments: NG tube Restrictions Weight Bearing Restrictions: No RUE Weight Bearing: Partial weight bearing RUE Partial Weight Bearing Percentage or Pounds: Can place ~20lbs through his UEs for mobility per MD Other Position/Activity Restrictions: sternal precautions    Mobility  Bed Mobility Overal bed mobility: Needs Assistance Bed Mobility: Supine to Sit Rolling: Min assist   Supine to sit: Min assist     General bed mobility comments: min A to come to EOB, cues needed to best place BUEs and sequence  transfer  Transfers Overall transfer level: Needs assistance Equipment used: (EVA walker) Transfers: Sit to/from Stand Sit to Stand: Mod assist;+2 safety/equipment         General transfer comment: mod A +2 to rise and steady and boost hips up to standing position  Ambulation/Gait Ambulation/Gait assistance: Min assist;+2 safety/equipment;Mod assist Gait Distance (Feet): 80 Feet Assistive device: (EVA walker) Gait Pattern/deviations: Step-through pattern;Decreased step length - right;Decreased step length - left Gait velocity: decr Gait velocity interpretation: <1.31 ft/sec, indicative of household ambulator General Gait Details: Slow, mildly unsteady gait with EVA for support; 2/4 DOE. Pt on 2L/min 02 [place] and Sp02 in 90s throughout. Pt needed some assist to steer Harmon Pier walker.  Upon return to room, pt began to have BM and PT obtained 3N1 and got pt on 3N1 and cleaned him prior to getting back in bed.     Stairs             Wheelchair Mobility    Modified Rankin (Stroke Patients Only)       Balance Overall balance assessment: Needs assistance Sitting-balance support: Feet supported;No upper extremity supported Sitting balance-Leahy Scale: Fair Sitting balance - Comments: Min guard-Min A for EOB balance. Postural control: Posterior lean Standing balance support: During functional activity;Bilateral upper extremity supported Standing balance-Leahy Scale: Poor Standing balance comment: Harmon Pier walker for support plus therapist assist                            Cognition Arousal/Alertness: Awake/alert Behavior During Therapy: Flat affect Overall Cognitive Status: No family/caregiver  present to determine baseline cognitive functioning Area of Impairment: Problem solving                             Problem Solving: Slow processing;Requires verbal cues General Comments: follows 1-2 step commands, needs increased time for problem solving       Exercises General Exercises - Lower Extremity Ankle Circles/Pumps: AROM;Both;10 reps Heel Slides: AROM;Both;10 reps    General Comments General comments (skin integrity, edema, etc.): VSS throughout      Pertinent Vitals/Pain Pain Assessment: No/denies pain Faces Pain Scale: No hurt Pain Location: generalized Pain Descriptors / Indicators: Grimacing Pain Intervention(s): Limited activity within patient's tolerance;Monitored during session;Repositioned    Home Living                      Prior Function            PT Goals (current goals can now be found in the care plan section) Acute Rehab PT Goals Patient Stated Goal: get better Progress towards PT goals: Progressing toward goals    Frequency    Min 3X/week      PT Plan Current plan remains appropriate    Co-evaluation PT/OT/SLP Co-Evaluation/Treatment: Yes Reason for Co-Treatment: Complexity of the patient's impairments (multi-system involvement);For patient/therapist safety PT goals addressed during session: Mobility/safety with mobility OT goals addressed during session: ADL's and self-care;Proper use of Adaptive equipment and DME;Strengthening/ROM      AM-PAC PT "6 Clicks" Mobility   Outcome Measure  Help needed turning from your back to your side while in a flat bed without using bedrails?: A Little Help needed moving from lying on your back to sitting on the side of a flat bed without using bedrails?: A Lot Help needed moving to and from a bed to a chair (including a wheelchair)?: A Lot Help needed standing up from a chair using your arms (e.g., wheelchair or bedside chair)?: A Lot Help needed to walk in hospital room?: A Little Help needed climbing 3-5 steps with a railing? : Total 6 Click Score: 13    End of Session Equipment Utilized During Treatment: Gait belt;Oxygen Activity Tolerance: Patient tolerated treatment well Patient left: in bed;with call bell/phone within reach;with bed alarm  set Nurse Communication: Mobility status PT Visit Diagnosis: Other abnormalities of gait and mobility (R26.89);Muscle weakness (generalized) (M62.81)     Time: 6213-0865 PT Time Calculation (min) (ACUTE ONLY): 36 min  Charges:  $Gait Training: 8-22 mins                     Amalio Loe W,PT [employer] Pager:  [phone]  Office:  [place] 05/09/2019, 1:51 PM

## 2019-05-09 NOTE — Progress Notes (Signed)
Inpatient Rehab Admissions:  Inpatient Rehab Consult received.  I met with patient and his son at the bedside for rehabilitation assessment and to discuss goals and expectations of an inpatient rehab admission.  They are both agreeable to CIR stay for pt when medically ready and bed available.  Can provide 24/7 supervision at discharge from CIR if needed.   Signed: Shann Medal, PT, DPT Admissions Coordinator [phone] 05/09/19  3:45 PM

## 2019-05-09 NOTE — Progress Notes (Signed)
Nutrition Follow up   DOCUMENTATION CODES:   Not applicable  INTERVENTION:  Transition to bolus:  -1 carton of Glucerna 1.5 six times daily via [name]  -30 ml Prostat BID -Renal MVI daily   Provides: 2336 kcals, 147 grams protein, 1080 ml free water.   -Magic Cup TID  NUTRITION DIAGNOSIS:   Increased nutrient needs related to post-op healing as evidenced by estimated needs.  Ongoing  GOAL:   Patient will meet greater than or equal to 90% of their needs   Addressed via tube feeding   MONITOR:   Weight trends, Diet advancement, Vent status, Skin, TF tolerance, Labs, I & O's  REASON FOR ASSESSMENT:   Consult Enteral/tube feeding initiation and management  ASSESSMENT:   Patient with PMH significant for CAD s/p PCI, prostate cancer, HTN, CHF, and DM. Presents this admission with chronic a.fib with RVR.   1/28- s/p L heart cath  1/30- s/p CABG x3, MAZE, extubated  2/2- re-intubated  2/5- extubated 2/8- re-intubated  2/10- s/p impella, trach, NGT placed-start trickle feeding 2/11- start CRRT 2/23- removal of impella, [name] dislodged  2/24- CRRT stop 3/1- failed iHD, back on CRRT 3/2- s/p [name], nocturnal feedings started 3/4- diet advanced DYS2 , nectar thick  3/8- s/p L PleurX catheter and L TDC 3/10- tolerated iHD 3/13- s/p decannulation  3/15- s/p cystoscopy  3/17- [name] clogged, s/p replacement   Pt discussed during ICU rounds and with RN.   For HD today. Continues with bladder irrigation. 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 Tolerating current TF regimen. Nausea improved. Consumed 25% of breakfast this am. Dysphagia better with diflucan but remains on D1 honey thick. Does not want PO supplementation. Will transition to bolus feedings and decrease number of boluses as PO intake progresses. If intake remains poor, may need to consider PEG.   We do not carry Jevity 1.5 in cartons, transition to Glucerna 1.5 as it has similar  fiber content.   Admission weight: 89.3 kg  Current weight: 88.7 kg   Last HD 3/22: 1766 ml net UF   Medications: phoslyra, SS novolog, lantus, viokace, solumedrol, rena-vit Labs: Na 131 (L) K 6.4 (H) CBG 164-198  Diet Order:   Diet Order            DIET - DYS 1 Room service appropriate? Yes; Fluid consistency: Honey Thick  Diet effective now              EDUCATION NEEDS:   Not appropriate for education at this time  Skin:  Skin Assessment: Skin Integrity Issues: Skin Integrity Issues:: Stage I, DTI, Stage II DTI: chest, R heel Stage I: vertebral column Stage II: buttocks, L ear Incisions: chest, leg  Last BM:  3/23  Height:   Ht Readings from Last 1 Encounters:  03/26/19 5\' 8"  (1.727 m)    Weight:   Wt Readings from Last 1 Encounters:  05/09/19 88.7 kg    Ideal Body Weight:  70 kg  BMI:  Body mass index is 29.73 kg/m.  Estimated Nutritional Needs:   Kcal:  2200-2400 kcal  Protein:  135-155 grams  Fluid:  >/= 2.2 L/day   Mariana Single RD, LDN Clinical Nutrition Pager # - [phone]

## 2019-05-09 NOTE — Progress Notes (Signed)
  Speech Language Pathology Treatment: Dysphagia  Patient Details Name: Jesse Murphy MRN: [id number] DOB: 10-22-1928 Today's Date: 05/09/2019 Time: 1031-5945 SLP Time Calculation (min) (ACUTE ONLY): 13 min  Assessment / Plan / Recommendation Clinical Impression  Pt was seen for dysphagia treatment and was cooperative throughout the session but requested that it be abbreviated due to fatigue and his feeling dyspneic all morning. Pt was educated regarding the purpose of dysphagia exercises and he verbalized understanding. He completed effortful swallows and the masako with min-mod verbal cues. He reported that he has been inconsistently completing some of the dysphagia exercises from yesterday and was able to recall some which were targeted. He tolerated limited trials of puree and honey thick liquids without overt s/sx of aspiration. SLP will continue to follow pt.   HPI HPI: 86 y.o. male with PMH: h/o CAD s/p CABG and clipping of left atrial appendage, who presented with SOB on 2/1. He became hypoxic on 2/2 with pulmonary edema and needed to be intubated on 2/2 and extubated 2/5. Reintubated 2/8.  Pt underwent impella placement and tracheostomy on 2/9. Pt started on CRRT 2/11. Impella removal 2/23. Pt decannulated 3/13.  MBS 3/16 with penetration of nectar to the vocal folds without sensation.        SLP Plan  Continue with current plan of care       Recommendations  Diet recommendations: Dysphagia 1 (puree);Honey-thick liquid Liquids provided via: Cup;Straw Medication Administration: Whole meds with puree Supervision: Full supervision/cueing for compensatory strategies;Staff to assist with self feeding Compensations: Slow rate;Small sips/bites Postural Changes and/or Swallow Maneuvers: Seated upright 90 degrees                Oral Care Recommendations: Oral care BID Follow up Recommendations: Skilled Nursing facility SLP Visit Diagnosis: Dysphagia, oropharyngeal phase  (R13.12) Plan: Continue with current plan of care       Odis Wickey I. [name], [place], [place] Office number [phone] Pager [place] 05/09/2019, 12:22 PM

## 2019-05-09 NOTE — Progress Notes (Addendum)
Patient ID: Jesse Murphy, male   DOB: [birth year], 86 y.o.   MRN: [id number]    Advanced Heart Failure Rounding Note  PCP-Cardiologist: Kate Sable, MD   Subjective:    Events - Impella removed 2/23. Intraoperative TEE showed EF ~40% - Had tunneled cath placed 3/9 and left PleurX tube.  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 Resting comfortably in bed.   VSS. O2 sats stable on [place]. K 6.4 today. On for HD.   Rash markedly improved.   Continue w/ CBI. Foley bag clear. Hgb stable.   Objective:   Weight Range: 88.7 kg Body mass index is 29.73 kg/m.   Vital Signs:   Temp:  [97.5 F (36.4 C)-99 F (37.2 C)] 97.9 F (36.6 C) (03/24 0300) Pulse Rate:  [71-85] 84 (03/24 0700) Resp:  [9-27] 24 (03/24 0700) BP: (114-155)/(43-89) 138/54 (03/24 0700) SpO2:  [94 %-100 %] 96 % (03/24 0700) Weight:  [88.7 kg] 88.7 kg (03/24 0500) Last BM Date: 05/08/19  Weight change: Filed Weights   05/07/19 0500 05/08/19 0400 05/09/19 0500  Weight: 88.1 kg 87.5 kg 88.7 kg    Intake/Output:   Intake/Output Summary (Last 24 hours) at 05/09/2019 0755 Last data filed at 05/09/2019 0700 Gross per 24 hour  Intake 21673.64 ml  Output 16900 ml  Net 4773.64 ml     Physical Exam   General:  Elderly WM, lying in bed.  No resp difficulty HEENT: normal + NGT anicteric  Neck: supple. Mildly elevated JVD, trach site healed. Carotids 2+ bilat; no bruits. No lymphadenopathy or thryomegaly appreciated. + LIJ tunneled cath Cor: PMI nondisplaced. Regular rate & rhythm. No rubs, gallops or murmurs.  Lungs: clear, no wheezing  Abdomen: soft, nontender, nondistended. No hepatosplenomegaly. No bruits or masses. Good bowel sounds. Extremities: no cyanosis, clubbing, trace bilateral LEE, previous diffuse maculopapular rash resolving  Neuro: alert &  oriented x 3, cranial nerves grossly intact. moves all 4 extremities w/o difficulty. Affect pleasant   Telemetry   NSR 80s. No arrhthymias Personally reviewed   Labs    CBC Recent Labs    05/08/19 0248 05/08/19 0248 05/08/19 2030 05/09/19 0340  WBC 13.7*  --   --  14.0*  HGB 7.1*   < > 8.7* 8.7*  HCT 24.3*   < > 28.7* 28.9*  MCV 101.7*  --   --  94.1  PLT 205  --   --  244   < > = values in this interval not displayed.   Basic Metabolic Panel Recent Labs    05/08/19 0248 05/09/19 0340  NA 135 131*  K 5.4* 6.4*  CL 98 94*  CO2 27 26  GLUCOSE 166* 194*  BUN 95* 143*  CREATININE 4.14* 5.36*  CALCIUM 8.2* 8.5*  MG 2.2 2.3  PHOS 3.2 3.6   Liver Function Tests Recent Labs    05/08/19 0248 05/09/19 0340  ALBUMIN 2.3* 2.6*   No results for input(s): LIPASE, AMYLASE in the last 72 hours. Cardiac Enzymes No results for input(s): CKTOTAL, CKMB, CKMBINDEX, TROPONINI in the last 72 hours.  BNP: BNP (last 3 results) Recent Labs    03/22/19 0401 03/26/19 0626 03/27/19 1255  BNP 340.4* 687.5* 800.4*    ProBNP (last 3 results) No results for input(s): PROBNP in the last 8760 hours.   D-Dimer No  results for input(s): DDIMER in the last 72 hours. Hemoglobin A1C No results for input(s): HGBA1C in the last 72 hours. Fasting Lipid Panel No results for input(s): CHOL, HDL, LDLCALC, TRIG, CHOLHDL, LDLDIRECT in the last 72 hours. Thyroid Function Tests No results for input(s): TSH, T4TOTAL, T3FREE, THYROIDAB in the last 72 hours.  Invalid input(s): FREET3  Other results:   Imaging    No results found.   Medications:     Scheduled Medications: . [START ON 05/14/2019] aspirin  81 mg Oral Daily  . calcium acetate (Phos Binder)  667 mg Per Tube TID WC  . chlorhexidine  15 mL Mouth Rinse BID  . Chlorhexidine Gluconate Cloth  6 each Topical Daily  . famotidine  10 mg Per Tube Daily  . feeding supplement (PRO-STAT SUGAR FREE 64)  60 mL Per Tube BID  .  Gerhardt's butt cream   Topical BID  . hydrocortisone   Rectal BID  . hydrocortisone  25 mg Rectal BID  . hydrOXYzine  10 mg Per Tube Q6H  . insulin aspart  0-15 Units Subcutaneous TID WC  . insulin aspart  0-5 Units Subcutaneous QHS  . insulin glargine  36 Units Subcutaneous Daily  . lipase/protease/amylase)  20,880 Units Per Tube Once   And  . sodium bicarbonate  650 mg Per Tube Once  . magic mouthwash  5 mL Oral TID  . mouth rinse  15 mL Mouth Rinse q12n4p  . methylPREDNISolone (SOLU-MEDROL) injection  80 mg Intravenous Daily  . multivitamin  1 tablet Per Tube QHS  . nutrition supplement (JUVEN)  1 packet Per Tube BID BM  . simethicone  80 mg Oral QID  . sodium chloride flush  10-40 mL Intracatheter Q12H  . sodium chloride flush  10-40 mL Intracatheter Q12H    Infusions: . sodium chloride    . sodium chloride    . sodium chloride Stopped (05/01/19 1307)  . feeding supplement (JEVITY 1.5 CAL/FIBER) 1,000 mL (05/08/19 2348)  . ferric gluconate (FERRLECIT/NULECIT) IV Stopped (05/08/19 0025)  . fluconazole (DIFLUCAN) IV Stopped (05/08/19 1411)    PRN Medications: sodium chloride, sodium chloride, oxyCODONE **AND** acetaminophen, dextrose, hyoscyamine, levalbuterol, lidocaine (PF), lidocaine-prilocaine, loperamide HCl, ondansetron (ZOFRAN) IV, pentafluoroprop-tetrafluoroeth, promethazine, Resource ThickenUp Clear, sodium chloride flush, sodium chloride flush, sodium chloride flush    Assessment/Plan   1. Acute systolic HF -> Cardiogenic shock - post-op echo on 03/20/19 EF 25-30% (pre-op 30-35%. In 12/2017 EF normal) - Required Impella support post CABG. Impella removed 2/23. Post-op TEE EF 40% - volume status slightly up . Managed with iHD - tolerating iHD. MWF Schedule. Now off midodrine due to rash (unclear which medication caused). BP stable - unable to tolerate GDMT with low BP and ESRD  2. CAD s/p CABG this admit (LIMA->LAD, SVG->OM, SVG->RCA on 03/15/19) - No s/s  ischemia - Continue statin. Dr. Prescott Gum holding ASA for several days with bleeding  3. Acute hypoxic respiratory failure with Pseudomonas PNA - s/p trach on 03/27/19 - completed meropenem for pseudomonas PNA - trach aspirate 3/5 w/ regrowth of Pseudomonas again.   - Completed antibiotic course.  -Trach decannulated 3/13. Stable on [place] - No change   4. PAF - s/p MAZE and LAA occlusion - Maintaining NSR.  - Off amio due to rash - No AC with ongoing hematuria  5. AKI  - due to shock/ATN - On CVVHD.  - Tunneled cath placed 3/9  - He is tolerating iHD. Midodrine now off. Continue to follow closely  6. Hematuria -Ongoing.  Remains on CBI. -Cystoscopy 3/15 with fulgeration - Received 1UPRBC 05/02/19 HGb  6.5>8.5>8.4> 7.1>8.7 - off anticoagulation. - Urology started aluminum irrigation   7. Debility, severe - continue PT/OT  8. F/E/N - Tube feeds per Cor-trak.     9. Left Pleural Effusion  - moderate to large. - s/p PleurX on 3/8.  - TCTS managing. Draining M/Th  10. Diffuse rash - Appearance of drug induced rash. - Steroids started 3/21 - Improved today - Can add needed meds back 1 by 1 watching for worsening   11. Anemia: - h/o melena and hematuria this admit. Now with copious BRBPR - Hgb stable today at 8.7 - monitor. transfuse if < 7  12. Dysphagia and BRBPR - ? Possible esophagitis +/- radiation proctitis - GI following. On IV fluconazole. Will get EGD if no improvement in dysphagia  13. Hyperkalemia - K 6.4 today. Plan HD today   - HD MWF - lokelma as needed on non HD days.  Lyda Jester, PA-C  7:55 AM   Patient seen and examined with the above-signed Advanced Practice Provider and/or Housestaff. I personally reviewed laboratory data, imaging studies and relevant notes. I independently examined the patient and formulated the important aspects of the plan. I have edited the note to reflect any of my changes or salient points. I have personally  discussed the plan with the patient and/or family.  Remains tenuous. Continues with hematuria and BRBPR. ASA on hold. Received 1u RBCs may need more. GI has seen.   Potassium elevated. For HD today. Remains in NSR off amio.   On IV fluconazole for presumed candida esophagitis. He is some better.   Glori Bickers, MD  4:54 PM

## 2019-05-09 NOTE — Progress Notes (Signed)
[facility] Progress Note    Assessment/ Plan:   1. AKI(Cr was 0.7 prior to surgery)- due to postoperative CHF/cardiogenic - started on CRRT  03/29/19 - 2/24. Attempted IHD on 2/26 with minimal UF --> increased pressors and rapid afib.  Then CRRT on 3/1-- 3/8 then switched back to IHD which he is tolerating. Tolerating IHD.  MWF schedule - HD now.  Has been vein mapped; will see how he's doing this week prior to consulting VVS.   Has been getting 2.5L off per rx, 1.7L 3/22.  No heparin in rx d/t hematuria.  Plan next HD this AM.  He doesn't appear to be recovering renal function and after 6 weeks of dialysis dependence I will consider him ESRD.    2. Acute systolic CHF/cardiogenic shock- EF 25-30%; s/p Impella- now out. optimize volume as above. Off midodrine  15 mg TID now due to c/f drug rash.  BPs currently reasonable - will follow BP with HD.   3. CAD s/p [id number] 1/68/37 complicated by cardiogenic shock and ARF.Postop echo EF 25-30  4. Acute hypoxic respiratory failure with pseudomonas pneumonia- s/p trach on 03/27/19.s/pabx per PCCM-> completed abx.  Decannulated  5. 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  On CBI with alum.  6. ABLA + AoESRD- Hx Melen and hematuria. Transfuse as needed.Off aranesp now due to concern for drug rash etiology.  % sat 11, feraheme x 1 and then can get the rest of his iron load with HD--> ordered  7. Protein malnutrition- moderate to severe- alb 2.0  8. Secondary hyperparathyroidism/bone mineral -  phos rising,s/p phoslo -.  pth 116  9. Rash: looks like drug reaction--> emollients/ hydroxyzine prn, ? Abx, amio discontinued without improvement.  Concern midodrine vs aranesp.  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  On steroids now.   10.  Bloody bowel movement- GI consulted. 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   11.  Disposition-  trach is out-->  this was a huge step re: outpatient HD candidacy.  Will see how he does throughout week and if continues to improve will consider perm access (Ie AVF/AVG)--> only has L IJ TDC for now  Subjective:    Remains on CBI but clear now.  K 6.4 this AM - due to HD this AM.   He is awake and alert, no complaints.     Objective:   BP (!) 136/56   Pulse 80   Temp 98.6 F (37 C) (Oral)   Resp (!) 26   Ht 5\' 8"  (1.727 m)   Wt 88.7 kg   SpO2 96%   BMI 29.73 kg/m   Intake/Output Summary (Last 24 hours) at 05/09/2019 1008 Last data filed at 05/09/2019 0945 Gross per 24 hour  Intake 21638.64 ml  Output 17300 ml  Net 4338.64 ml   Weight change: 1.2 kg  Physical Exam: Gen: older gentleman, NAD, calm in bed NECK: trach site dressed CVS: RRR Resp: clear anteriorly, bases diminished Abd: soft Ext: trace dependent and flank edema, improving, lacy rash/ erythematous over bilateral legs, some on stomach, improved considerably ACCESS: [facility]  Imaging: DG Chest Port 1 View  Result Date: 05/09/2019 CLINICAL DATA:  Tachycardia.  Recent CABG. EXAM: PORTABLE CHEST 1 VIEW COMPARISON:  05/05/2019 FINDINGS: Left IJ central venous catheter unchanged. Enteric tube courses into the stomach and off the film as tip is not visualized. Left-sided chest tube unchanged. Lungs are adequately inflated and demonstrate continued  hazy perihilar and bibasilar opacification likely mild interstitial edema with bilateral effusions/bibasilar atelectasis as these findings are stable to slightly worse. Cardiomediastinal silhouette and remainder of the exam is unchanged. IMPRESSION: Persistent hazy opacification over the perihilar and bibasilar regions likely mild interstitial edema with bilateral pleural effusions/bibasilar atelectasis unchanged to slightly worse. Tubes and lines as described. Electronically Signed   By: Marin Olp M.D.   On: 05/09/2019 08:00    Labs: BMET Recent Labs  Lab 05/03/19 0441 05/04/19 0429  05/05/19 0427 05/06/19 0427 05/07/19 0416 05/08/19 0248 05/09/19 0340  NA 138 133* 133* 134* 133* 135 131*  K 4.8 5.2* 4.7 5.1 6.3* 5.4* 6.4*  CL 98 93* 95* 97* 94* 98 94*  CO2 29 26 28 27 27 27 26   GLUCOSE 125* 156* 122* 112* 188* 166* 194*  BUN 50* 88* 71* 98* 135* 95* 143*  CREATININE 3.83* 4.95* 3.79* 4.96* 5.87* 4.14* 5.36*  CALCIUM 8.4* 7.9* 7.9* 7.9* 8.5* 8.2* 8.5*  PHOS 4.7* 4.5 3.0 3.5 3.0 3.2 3.6   CBC Recent Labs  Lab 05/07/19 1230 05/07/19 1230 05/07/19 2159 05/08/19 0248 05/08/19 2030 05/09/19 0340  WBC 12.7*  --  11.0* 13.7*  --  14.0*  HGB 7.9*   < > 7.3* 7.1* 8.7* 8.7*  HCT 26.4*   < > 24.7* 24.3* 28.7* 28.9*  MCV 98.5  --  99.2 101.7*  --  94.1  PLT 198  --  197 205  --  244   < > = values in this interval not displayed.    Medications:    . [START ON 05/14/2019] aspirin  81 mg Oral Daily  . calcium acetate (Phos Binder)  667 mg Per Tube TID WC  . chlorhexidine  15 mL Mouth Rinse BID  . Chlorhexidine Gluconate Cloth  6 each Topical Daily  . famotidine  10 mg Per Tube Daily  . feeding supplement (PRO-STAT SUGAR FREE 64)  60 mL Per Tube BID  . Gerhardt's butt cream   Topical BID  . hydrocortisone   Rectal BID  . hydrocortisone  25 mg Rectal BID  . hydrOXYzine  10 mg Per Tube Q6H  . insulin aspart  0-15 Units Subcutaneous TID WC  . insulin aspart  0-5 Units Subcutaneous QHS  . insulin glargine  36 Units Subcutaneous Daily  . lipase/protease/amylase)  20,880 Units Per Tube Once   And  . sodium bicarbonate  650 mg Per Tube Once  . magic mouthwash  5 mL Oral TID  . mouth rinse  15 mL Mouth Rinse q12n4p  . methylPREDNISolone (SOLU-MEDROL) injection  80 mg Intravenous Daily  . multivitamin  1 tablet Per Tube QHS  . nutrition supplement (JUVEN)  1 packet Per Tube BID BM  . simethicone  80 mg Oral QID  . sodium chloride flush  10-40 mL Intracatheter Q12H  . sodium chloride flush  10-40 mL Intracatheter Q12H     Jannifer Hick MD [facility] Pager [phone]

## 2019-05-09 NOTE — Progress Notes (Signed)
TCTS BRIEF SICU PROGRESS NOTE  9 Days Post-Op  S/P Procedure(s) (LRB): CYSTOSCOPY WITH FULGERATION (N/A)   Stable day  Plan: Continue current plan  Rexene Alberts, MD 05/09/2019 7:00 PM

## 2019-05-09 NOTE — Progress Notes (Signed)
9 Days Post-Op Procedure(s) (LRB): CYSTOSCOPY WITH FULGERATION (N/A) Subjective: Less nausea today, ate some breakfast with tube feeds remaining at 55/h Chest x-ray with interstitial edema, potassium 6.4 and HD planned for today Bladder irrigation shows minimal clot-we'll reduce rate of CBI and follow Hemoglobin stable after transfusion following grossly bloody stool yesterday from radiation proctitis-steroid suppositories started Patient able to walk 60 feet with physical therapy and rest Blood pressure stable off midodrine, rash improved Maintaining sinus rhythm on amiodarone Swallowing has improved with Diflucan for probable Candida inflammation Objective: Vital signs in last 24 hours: Temp:  [97.5 F (36.4 C)-99 F (37.2 C)] 98.6 F (37 C) (03/24 0956) Pulse Rate:  [71-85] 80 (03/24 0900) Cardiac Rhythm: Normal sinus rhythm (03/24 0956) Resp:  [9-28] 26 (03/24 0900) BP: (114-155)/(43-89) 136/56 (03/24 0900) SpO2:  [94 %-100 %] 96 % (03/24 0900) FiO2 (%):  [28 %] 28 % (03/24 0956) Weight:  [88.7 kg] 88.7 kg (03/24 0500)  Hemodynamic parameters for last 24 hours:    Intake/Output from previous day: 03/23 0701 - 03/24 0700 In: 21673.6 [Blood:330; NG/GT:1320; IV Piggyback:123.6] Out: 16900 [Urine:16900] Intake/Output this shift: Total I/O In: 3130 [I.V.:20; Other:3000; NG/GT:110] Out: 2900 [Urine:2900]       Exam    General- alert and comfortable    Neck- no JVD, no cervical adenopathy palpable, no carotid bruit   Lungs- clear without rales, wheezes   Cor- regular rate and rhythm, no murmur , gallop   Abdomen- soft, non-tender   Extremities - warm, non-tender, minimal edema   Neuro- oriented, appropriate, no focal weakness   Lab Results: Recent Labs    05/08/19 0248 05/08/19 0248 05/08/19 2030 05/09/19 0340  WBC 13.7*  --   --  14.0*  HGB 7.1*   < > 8.7* 8.7*  HCT 24.3*   < > 28.7* 28.9*  PLT 205  --   --  244   < > = values in this interval not displayed.    BMET:  Recent Labs    05/08/19 0248 05/09/19 0340  NA 135 131*  K 5.4* 6.4*  CL 98 94*  CO2 27 26  GLUCOSE 166* 194*  BUN 95* 143*  CREATININE 4.14* 5.36*  CALCIUM 8.2* 8.5*    PT/INR: No results for input(s): LABPROT, INR in the last 72 hours. ABG    Component Value Date/Time   PHART 7.488 (H) 04/07/2019 0405   HCO3 25.9 04/07/2019 0405   TCO2 36 (H) 04/23/2019 0449   ACIDBASEDEF 1.0 04/05/2019 0540   O2SAT 72.4 05/06/2019 0418   CBG (last 3)  Recent Labs    05/08/19 1558 05/08/19 2201 05/09/19 0634  GLUCAP 178* 198* 179*    Assessment/Plan: S/P Procedure(s) (LRB): CYSTOSCOPY WITH FULGERATION (N/A) HD for postoperative acute renal failure, elevated potassium Hold aspirin, Lovenox for persistent bleeding from bladder and rectum from previous radiation seeds for prostate cancer When Patient does not need continuous bladder irrigation he'll be ready for 2C progressive care  LOS: 56 days    Tharon Aquas Trigt III 05/09/2019

## 2019-05-09 NOTE — Progress Notes (Signed)
CRITICAL VALUE ALERT  Critical Value:  K+ 6.4   Date & Time Notied:  05/09/2019 at [place]  Provider Notified: Johnney Ou, MD   Orders Received/Actions taken: HD scheduled for today. No new orders at this time.

## 2019-05-10 ENCOUNTER — Inpatient Hospital Stay (HOSPITAL_COMMUNITY): Payer: Medicare Other

## 2019-05-10 DIAGNOSIS — I255 Ischemic cardiomyopathy: Secondary | ICD-10-CM

## 2019-05-10 LAB — RENAL FUNCTION PANEL
Albumin: 2.7 g/dL — ABNORMAL LOW (ref 3.5–5.0)
Anion gap: 10 (ref 5–15)
BUN: 60 mg/dL — ABNORMAL HIGH (ref 8–23)
CO2: 29 mmol/L (ref 22–32)
Calcium: 8.4 mg/dL — ABNORMAL LOW (ref 8.9–10.3)
Chloride: 97 mmol/L — ABNORMAL LOW (ref 98–111)
Creatinine, Ser: 2.91 mg/dL — ABNORMAL HIGH (ref 0.61–1.24)
GFR calc Af Amer: 21 mL/min — ABNORMAL LOW (ref >60)
GFR calc non Af Amer: 18 mL/min — ABNORMAL LOW (ref >60)
Glucose, Bld: 51 mg/dL — ABNORMAL LOW (ref 70–99)
Phosphorus: 3.5 mg/dL (ref 2.5–4.6)
Potassium: 4.6 mmol/L (ref 3.5–5.1)
Sodium: 136 mmol/L (ref 135–145)

## 2019-05-10 LAB — GLUCOSE, CAPILLARY
Glucose-Capillary: 106 mg/dL — ABNORMAL HIGH (ref 70–99)
Glucose-Capillary: 115 mg/dL — ABNORMAL HIGH (ref 70–99)
Glucose-Capillary: 153 mg/dL — ABNORMAL HIGH (ref 70–99)
Glucose-Capillary: 157 mg/dL — ABNORMAL HIGH (ref 70–99)
Glucose-Capillary: 202 mg/dL — ABNORMAL HIGH (ref 70–99)
Glucose-Capillary: 38 mg/dL — CL (ref 70–99)
Glucose-Capillary: 69 mg/dL — ABNORMAL LOW (ref 70–99)

## 2019-05-10 LAB — MAGNESIUM: Magnesium: 2.1 mg/dL (ref 1.7–2.4)

## 2019-05-10 LAB — POCT I-STAT 7, (LYTES, BLD GAS, ICA,H+H)
Acid-base deficit: 5 mmol/L — ABNORMAL HIGH (ref 0.0–2.0)
Bicarbonate: 20 mmol/L (ref 20.0–28.0)
Calcium, Ion: 1.14 mmol/L — ABNORMAL LOW (ref 1.15–1.40)
HCT: 29 % — ABNORMAL LOW (ref 39.0–52.0)
Hemoglobin: 9.9 g/dL — ABNORMAL LOW (ref 13.0–17.0)
O2 Saturation: 99 %
Patient temperature: 98.3
Potassium: 5.8 mmol/L — ABNORMAL HIGH (ref 3.5–5.1)
Sodium: 129 mmol/L — ABNORMAL LOW (ref 135–145)
TCO2: 21 mmol/L — ABNORMAL LOW (ref 22–32)
pCO2 arterial: 34.6 mmHg (ref 32.0–48.0)
pH, Arterial: 7.369 (ref 7.350–7.450)
pO2, Arterial: 128 mmHg — ABNORMAL HIGH (ref 83.0–108.0)

## 2019-05-10 LAB — CBC
HCT: 26.5 % — ABNORMAL LOW (ref 39.0–52.0)
Hemoglobin: 7.9 g/dL — ABNORMAL LOW (ref 13.0–17.0)
MCH: 28.3 pg (ref 26.0–34.0)
MCHC: 29.8 g/dL — ABNORMAL LOW (ref 30.0–36.0)
MCV: 95 fL (ref 80.0–100.0)
Platelets: 190 10*3/uL (ref 150–400)
RBC: 2.79 MIL/uL — ABNORMAL LOW (ref 4.22–5.81)
RDW: 23.8 % — ABNORMAL HIGH (ref 11.5–15.5)
WBC: 12.7 10*3/uL — ABNORMAL HIGH (ref 4.0–10.5)
nRBC: 0.2 % (ref 0.0–0.2)

## 2019-05-10 MED ORDER — ATROPINE SULFATE 1 MG/10ML IJ SOSY: 0.5000 mg | PREFILLED_SYRINGE | Freq: Once | INTRAMUSCULAR | Status: DC

## 2019-05-10 MED ORDER — ATROPINE SULFATE 1 MG/10ML IJ SOSY: 1.0000 mg | PREFILLED_SYRINGE | Freq: Once | INTRAMUSCULAR | Status: AC

## 2019-05-10 MED ORDER — INSULIN GLARGINE 100 UNIT/ML ~~LOC~~ SOLN
24.0000 [IU] | Freq: Every day | SUBCUTANEOUS | Status: DC
  Filled 2019-05-10 (×2): qty 0.24

## 2019-05-10 MED ORDER — DEXTROSE 50 % IV SOLN
50.0000 mL | Freq: Once | INTRAVENOUS | Status: AC
  Administered 2019-05-10: 50 mL via INTRAVENOUS

## 2019-05-10 MED ORDER — ATROPINE SULFATE 1 MG/10ML IJ SOSY
PREFILLED_SYRINGE | INTRAMUSCULAR | Status: AC
  Administered 2019-05-10: 1 mg via INTRAVENOUS
  Filled 2019-05-10: qty 10

## 2019-05-10 MED ORDER — JEVITY 1.5 CAL/FIBER PO LIQD
1000.0000 mL | ORAL | Status: DC
  Administered 2019-05-10 – 2019-05-11 (×2): 1000 mL via ORAL
  Filled 2019-05-10 (×4): qty 1000

## 2019-05-10 MED ORDER — EPINEPHRINE HCL 5 MG/250ML IV SOLN IN NS
0.0000 ug/min | INTRAVENOUS | Status: DC
  Administered 2019-05-10: 3 ug/min via INTRAVENOUS

## 2019-05-10 NOTE — Progress Notes (Signed)
10 Days Post-Op Procedure(s) (LRB): CYSTOSCOPY WITH FULGERATION (N/A) Subjective:  Patient had a stable night I turned off the continuous bladder irrigation.  After approximately an hour the patient complained of lower abdominal discomfort and bladder spasms.  His nurse open the irrigant and washed out clot.  We will continue to try to transition off continuous bladder irrigation.  Patient receiving bolus tube feeds now and insulin dosing has been adjusted  Plan transfer to progressive care soon, unit to see  Objective: Vital signs in last 24 hours: Temp:  [97.4 F (36.3 C)-97.9 F (36.6 C)] 97.8 F (36.6 C) (03/25 0852) Pulse Rate:  [67-84] 80 (03/25 1100) Cardiac Rhythm: Normal sinus rhythm (03/25 0400) Resp:  [11-27] 21 (03/25 1100) BP: (75-139)/(37-80) 122/51 (03/25 1100) SpO2:  [92 %-100 %] 100 % (03/25 1100) FiO2 (%):  [28 %] 28 % (03/24 1200) Weight:  [83.2 kg-88.7 kg] 83.2 kg (03/25 0400)  Hemodynamic parameters for last 24 hours:    Intake/Output from previous day: 03/24 0701 - 03/25 0700 In: 6942.2 [P.O.:120; I.V.:415.2; NG/GT:407] Out: 65790 [Urine:10975] Intake/Output this shift: Total I/O In: 3600 [Other:3000; NG/GT:600] Out: 1100 [Urine:1100]       Exam    General- alert and comfortable    Neck- no JVD, no cervical adenopathy palpable, no carotid bruit   Lungs- clear without rales, wheezes   Cor- regular rate and rhythm, no murmur , gallop   Abdomen- soft, non-tender   Extremities - warm, non-tender, minimal edema   Neuro- oriented, appropriate, no focal weakness   Lab Results: Recent Labs    05/09/19 0340 05/10/19 0400  WBC 14.0* 12.7*  HGB 8.7* 7.9*  HCT 28.9* 26.5*  PLT 244 190   BMET:  Recent Labs    05/09/19 1602 05/10/19 0400  NA 138 136  K 3.7 4.6  CL 97* 97*  CO2 31 29  GLUCOSE 146* 51*  BUN 82* 60*  CREATININE 3.22* 2.91*  CALCIUM 8.5* 8.4*    PT/INR: No results for input(s): LABPROT, INR in the last 72 hours. ABG     Component Value Date/Time   PHART 7.488 (H) 04/07/2019 0405   HCO3 25.9 04/07/2019 0405   TCO2 36 (H) 04/23/2019 0449   ACIDBASEDEF 1.0 04/05/2019 0540   O2SAT 72.4 05/06/2019 0418   CBG (last 3)  Recent Labs    05/10/19 0613 05/10/19 0857 05/10/19 0935  GLUCAP 157* 69* 115*    Assessment/Plan: S/P Procedure(s) (LRB): CYSTOSCOPY WITH FULGERATION (N/A) Ambulate with PT on nondialysis days Drain left Pleurx catheter Mondays and Thursdays Encourage oral intake Transfer to progressive care soon   LOS: 57 days    Tharon Aquas Trigt III 05/10/2019

## 2019-05-10 NOTE — Brief Op Note (Signed)
04/30/2019  6:53 PM  PATIENT:  Jesse Murphy  86 y.o. male  PRE-OPERATIVE DIAGNOSIS:  Vagal reaction Heart Rate 17  POST-OPERATIVE DIAGNOSIS:  Vagal reaction HR 17  PROCEDURE:  EmergencyPlacement of transvenous temporary pacemaker wire in ICU SURGEON: [place] PHYSICIAN ASSISTANT:   ASSISTANTS: none   ANESTHESIA:   local  EBL:  20 mL   BLOOD ADMINISTERED:none  DRAINS: none   LOCAL MEDICATIONS USED:  LIDOCAINE  and Amount: 5 ml  SPECIMEN:  No Specimen  DISPOSITION OF SPECIMEN:  N/A  COUNTS:  no  TOURNIQUET:  na DICTATION: .Dragon Dictation  PLAN OF CARE: ICU bed  PATIENT DISPOSITION:  ICU bed   Delay start of Pharmacological VTE agent (>24hrs) due to surgical blood loss or risk of bleeding: yes

## 2019-05-10 NOTE — Progress Notes (Signed)
Hypoglycemic Event  CBG: 38  Treatment: dextrose 50% injection 71ml  Follow-up CBG: Time: 0614 CBG Result: [place]

## 2019-05-10 NOTE — Progress Notes (Signed)
1740: Pt c/o "having to pee." CBI Catheter not draining even with increased rate of flow. Pt's lower abdomen became distended and pt became bradycardic and obtunded. Code blue activated.   1750: Atropine 1 mg given with HR remaining in the 20-30s as ordered by Dr. Prescott Gum. Urinary catheter irrigated using sterile technique and flow reestablished with multiple clots draining from catheter. Non-rebreather mask applied.  1800: Second Atropine 1 mg given with HR still in the 20-30s. Dr. Prescott Gum at bedside to insert transvenous pacer.  Epinephrine drip started at 3 mcg/min as ordered by MD. Pt tolerated procedure well.   1840: Pt now paced with a rate of 80. SBP maintained >100 throughout bradycardic episode. Pt now more alert and following commands but confused at this time. CBI now draining freely with clear output.

## 2019-05-10 NOTE — Progress Notes (Signed)
Patient ID: Jesse Murphy, male   DOB: 07/29/1928, 86 y.o.   MRN: [id number]    Advanced Heart Failure Rounding Note  PCP-Cardiologist: Kate Sable, MD   Subjective:    Events - Impella removed 2/23. Intraoperative TEE showed EF ~40% - Had tunneled cath placed 3/9 and left PleurX tube.  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   Remains on CBI for ongoing hematuria.   Drug rash improved with steroids.  Tonight had vagal event due to clotted Foley and developed complete HB and profound bradycardia. Had transcutaneous pacing transiently then Dr. Prescott Gum inserted emergent TVP via RIJ at bedside.   Objective:   Weight Range: 83.2 kg Body mass index is 27.89 kg/m.   Vital Signs:   Temp:  [97.8 F (36.6 C)-98.5 F (36.9 C)] 98.5 F (36.9 C) (03/25 1543) Pulse Rate:  [25-90] 88 (03/25 2200) Resp:  [12-27] 21 (03/25 2200) BP: (102-153)/(37-75) 123/49 (03/25 2200) SpO2:  [92 %-100 %] 95 % (03/25 2200) Weight:  [83.2 kg] 83.2 kg (03/25 0400) Last BM Date: 05/10/19  Weight change: Filed Weights   05/09/19 1415 05/09/19 1900 05/10/19 0400  Weight: 88.7 kg 85.3 kg 83.2 kg    Intake/Output:   Intake/Output Summary (Last 24 hours) at 05/10/2019 2332 Last data filed at 05/10/2019 2200 Gross per 24 hour  Intake 13323.6 ml  Output 13550 ml  Net -226.4 ml     Physical Exam   General:  Elderly male sitting up in bed. No resp difficulty HEENT: normal Neck: supple. RIJ TVP Carotids 2+ bilat; no bruits. No lymphadenopathy or thryomegaly appreciated. Trach site healed Cor: PMI nondisplaced. Regular rate & rhythm. No rubs, gallops or murmurs. + LIJ tunneled cath Lungs: coarse Abdomen: soft, nontender, nondistended. No hepatosplenomegaly. No bruits or masses. Good bowel sounds. Extremities: no  cyanosis, clubbing, no edema  Rash nearly resolved Neuro: alert & orientedx3, cranial nerves grossly intact. moves all 4 extremities w/o difficulty. Affect pleasant + Foley  with CBI   Telemetry   V paced 80s. Remains paced when I turn rate down to 40. (I did not go farther) Personally reviewed   Labs    CBC Recent Labs    05/09/19 0340 05/09/19 0340 05/10/19 0400 05/10/19 1804  WBC 14.0*  --  12.7*  --   HGB 8.7*   < > 7.9* 9.9*  HCT 28.9*   < > 26.5* 29.0*  MCV 94.1  --  95.0  --   PLT 244  --  190  --    < > = values in this interval not displayed.   Basic Metabolic Panel Recent Labs    05/09/19 0340 05/09/19 0340 05/09/19 1602 05/09/19 1602 05/10/19 0400 05/10/19 1804  NA 131*   < > 138   < > 136 129*  K 6.4*   < > 3.7   < > 4.6 5.8*  CL 94*   < > 97*  --  97*  --   CO2 26   < > 31  --  29  --   GLUCOSE 194*   < > 146*  --  51*  --   BUN 143*   < > 82*  --  60*  --   CREATININE 5.36*   < > 3.22*  --  2.91*  --  CALCIUM 8.5*   < > 8.5*  --  8.4*  --   MG 2.3  --   --   --  2.1  --   PHOS 3.6  --   --   --  3.5  --    < > = values in this interval not displayed.   Liver Function Tests Recent Labs    05/09/19 0340 05/10/19 0400  ALBUMIN 2.6* 2.7*   No results for input(s): LIPASE, AMYLASE in the last 72 hours. Cardiac Enzymes No results for input(s): CKTOTAL, CKMB, CKMBINDEX, TROPONINI in the last 72 hours.  BNP: BNP (last 3 results) Recent Labs    03/22/19 0401 03/26/19 0626 03/27/19 1255  BNP 340.4* 687.5* 800.4*    ProBNP (last 3 results) No results for input(s): PROBNP in the last 8760 hours.   D-Dimer No results for input(s): DDIMER in the last 72 hours. Hemoglobin A1C No results for input(s): HGBA1C in the last 72 hours. Fasting Lipid Panel No results for input(s): CHOL, HDL, LDLCALC, TRIG, CHOLHDL, LDLDIRECT in the last 72 hours. Thyroid Function Tests No results for input(s): TSH, T4TOTAL, T3FREE, THYROIDAB in the last 72  hours.  Invalid input(s): FREET3  Other results:   Imaging    DG CHEST PORT 1 VIEW  Result Date: 05/10/2019 CLINICAL DATA:  New temporary pacemaker EXAM: PORTABLE CHEST 1 VIEW COMPARISON:  Portable exam 1838 hours compared to 05/09/2019 FINDINGS: Feeding tube extends into stomach. New RIGHT jugular pacing lead with tip projecting over superior RIGHT ventricle. LEFT jugular dual-lumen central venous catheter with tip projecting over SVC. External pacing leads present. Normal heart size post CABG. Mediastinal contours and pulmonary vascularity normal. Atherosclerotic calcification aorta. Bibasilar atelectasis and question layered effusions, decreased. No pneumothorax or new infiltrate identified. IMPRESSION: Improved bibasilar aeration. Electronically Signed   By: Lavonia Dana M.D.   On: 05/10/2019 18:57     Medications:     Scheduled Medications: . [START ON 05/14/2019] aspirin  81 mg Oral Daily  . calcium acetate (Phos Binder)  667 mg Per Tube TID WC  . chlorhexidine  15 mL Mouth Rinse BID  . Chlorhexidine Gluconate Cloth  6 each Topical Daily  . famotidine  10 mg Per Tube Daily  . feeding supplement (PRO-STAT SUGAR FREE 64)  30 mL Per Tube BID  . Gerhardt's butt cream   Topical BID  . heparin  4,000 Units Intravenous Once  . hydrocortisone   Rectal BID  . hydrocortisone  25 mg Rectal BID  . hydrOXYzine  10 mg Per Tube Q6H  . insulin aspart  0-15 Units Subcutaneous TID WC  . insulin aspart  0-5 Units Subcutaneous QHS  . insulin glargine  24 Units Subcutaneous Daily  . lipase/protease/amylase)  20,880 Units Per Tube Once   And  . sodium bicarbonate  650 mg Per Tube Once  . magic mouthwash  5 mL Oral TID  . mouth rinse  15 mL Mouth Rinse q12n4p  . methylPREDNISolone (SOLU-MEDROL) injection  80 mg Intravenous Daily  . multivitamin  1 tablet Per Tube QHS  . nutrition supplement (JUVEN)  1 packet Per Tube BID BM  . simethicone  80 mg Oral QID  . sodium chloride flush  10-40 mL  Intracatheter Q12H  . sodium chloride flush  10-40 mL Intracatheter Q12H    Infusions: . sodium chloride    . sodium chloride    . sodium chloride Stopped (05/08/19 1833)  . epinephrine 0.5 mcg/min (05/10/19 2200)  . feeding  supplement (JEVITY 1.5 CAL/FIBER) 1,000 mL (05/10/19 2024)  . ferric gluconate (FERRLECIT/NULECIT) IV 125 mg (05/09/19 1808)  . fluconazole (DIFLUCAN) IV 200 mg (05/10/19 1500)    PRN Medications: sodium chloride, sodium chloride, oxyCODONE **AND** acetaminophen, dextrose, hyoscyamine, levalbuterol, lidocaine (PF), lidocaine-prilocaine, loperamide HCl, ondansetron (ZOFRAN) IV, pentafluoroprop-tetrafluoroeth, promethazine, Resource ThickenUp Clear, sodium chloride flush, sodium chloride flush, sodium chloride flush    Assessment/Plan   1. Acute systolic HF -> Cardiogenic shock - post-op echo on 03/20/19 EF 25-30% (pre-op 30-35%. In 12/2017 EF normal) - Required Impella support post CABG. Impella removed 2/23. Post-op TEE EF 40% - Has developed ESRD - Volume status ok. Managed by HD - tolerating iHD. MWF Schedule. Now off midodrine due to rash (unclear which medication caused). BP stable - unable to tolerate GDMT with low BP and ESRD  2. CAD s/p CABG this admit (LIMA->LAD, SVG->OM, SVG->RCA on 03/15/19) - No s/s ischemia - Continue statin.  - ASA off due to hematuria and BRBPR  3. Acute hypoxic respiratory failure with Pseudomonas PNA - s/p trach on 03/27/19 - completed meropenem for pseudomonas PNA - trach aspirate 3/5 w/ regrowth of Pseudomonas again.   - Completed antibiotic course.  -Trach decannulated 3/13. Stable on [place] - No change   4. PAF - s/p MAZE and LAA occlusion - Converted back to NSR on amio. Off amio due to rash - No AC with ongoing hematuria  5. AKI -> ESRD - due to shock/ATN.  - Tunneled cath placed 3/9  - He is tolerating iHD. Midodrine now off.  - Vein mapping complete. Nephrology considering permanent access  6.  Hematuria -Ongoing.  Remains on CBI. -Cystoscopy 3/15 with fulgeration - off anticoagulation. - Urology started aluminum irrigation  - transfuse as needed  7. Complete heart block with profound symptomatic bradycardia - likely vagal in nature - s/p emergent TVP 3/25 - wean as tolerate  8. Debility, severe - continue PT/OT  9. F/E/N - Tube feeds per Cor-trak.     10. Left Pleural Effusion  - moderate to large. - s/p PleurX on 3/8.  - TCTS managing. Draining M/Th  10. Diffuse rash - Appearance of drug induced rash. - Steroids started 3/21 -> much improved - will begin rapid taper tomorrow - Can add needed meds back 1 by 1 watching for worsening  11. Anemia: - h/o melena and hematuria this admit. Now with copious BRBPR - monitor. transfuse if < 7.5  12. Dysphagia and BRBPR - ? Possible esophagitis +/- radiation proctitis - GI following. On IV fluconazole. Will get EGD if no improvement in dysphagia  13. Hyperkalemia - HD MWF - lokelma as needed on non HD days.  Glori Bickers, MD  11:32 PM

## 2019-05-10 NOTE — Progress Notes (Signed)
Hypoglycemic Event  CBG: 69  Treatment: Scheduled Glucerna Bolus Feed via [name]  Symptoms: None  Follow-up CBG: Time: 0937 CBG Result: 115  Possible Reasons for Event: Inadequate meal intake  Comments/MD notified: Dr. Cheree Ditto

## 2019-05-10 NOTE — Progress Notes (Signed)
Jesse Murphy Progress Note    Assessment/ Plan:   1. AKI(Cr was 0.7 prior to surgery)- due to postoperative CHF/cardiogenic - started on CRRT  03/29/19 - 2/24. Attempted IHD on 2/26 with minimal UF --> increased pressors and rapid afib.  Then CRRT on 3/1-- 3/8 then switched back to IHD which he is tolerating. Tolerating IHD.  MWF schedule - HD now.  Has been vein mapped; will see how he's doing this week prior to consulting [facility].   Has been getting 2.5L off per rx, 1.7L 3/22, 1.8L 3/24.  No heparin in rx d/t hematuria.  Plan next HD tomorrow AM.  He doesn't appear to be recovering renal function and after 6 weeks of dialysis dependence I will consider him ESRD.    2. Acute systolic CHF/cardiogenic shock- EF 25-30%; s/p Impella- now out. optimize volume as above. Off midodrine  15 mg TID now due to c/f drug rash.  BPs currently reasonable - will follow BP with HD.  Has been doing ok.   3. CAD s/p [id number] 9/92/42 complicated by cardiogenic shock and ARF.Postop echo EF 25-30  4. Acute hypoxic respiratory failure with pseudomonas pneumonia- s/p trach on 03/27/19.s/pabx per PCCM-> completed abx.  Decannulated  5. 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  On CBI per urology.  6. ABLA + AoESRD- Hx Melena and hematuria. Transfuse as needed.Off aranesp now due to concern for drug rash etiology.  % sat 11, feraheme x 1 and then can get the rest of his iron load with HD--> ordered  7. Protein malnutrition- moderate to severe- alb 2.0  8. Secondary hyperparathyroidism/bone mineral -  phos rising,s/p phoslo -.  pth 116  9. Rash: looks like drug reaction--> emollients/ hydroxyzine prn, ? Abx, amio discontinued without improvement.  Concern midodrine vs aranesp.  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  On steroids now.   10.  Bloody bowel movement- GI consulted. 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    11.  Disposition-  trach is out--> this was a huge step re: outpatient HD candidacy.  Will see how he does throughout week and if continues to improve will consider perm access (Ie AVF/AVG)--> only has L IJ TDC for now  Subjective:    S/p HD yesterday.  UF 1.8L.  Wt down 85.3 > 83.2kg this AM.  I/Os seem inaccurate with CBI - CBI 6L, outs via Foley 10.9?    Objective:   BP (!) 104/37   Pulse 78   Temp 97.9 F (36.6 C) (Axillary)   Resp 15   Ht 5\' 8"  (1.727 m)   Wt 83.2 kg   SpO2 92%   BMI 27.89 kg/m   Intake/Output Summary (Last 24 hours) at 05/10/2019 0831 Last data filed at 05/10/2019 0725 Gross per 24 hour  Intake 6767.21 ml  Output 9875 ml  Net -3107.79 ml   Weight change: 0 kg  Physical Exam: Gen: older gentleman, NAD, calm in bed NECK: trach site dressed CVS: RRR Resp: clear anteriorly, bases diminished Abd: soft GU: CBI running with blood tinged outpt again, no clots Ext: trace dependent and flank edema, improving, lacy rash/ erythematous over bilateral legs, some on stomach, improved considerably ACCESS: [facility]  Imaging: DG Chest Port 1 View  Result Date: 05/09/2019 CLINICAL DATA:  Tachycardia.  Recent CABG. EXAM: PORTABLE CHEST 1 VIEW COMPARISON:  05/05/2019 FINDINGS: Left IJ central venous catheter unchanged. Enteric tube courses into the stomach and off the film  as tip is not visualized. Left-sided chest tube unchanged. Lungs are adequately inflated and demonstrate continued hazy perihilar and bibasilar opacification likely mild interstitial edema with bilateral effusions/bibasilar atelectasis as these findings are stable to slightly worse. Cardiomediastinal silhouette and remainder of the exam is unchanged. IMPRESSION: Persistent hazy opacification over the perihilar and bibasilar regions likely mild interstitial edema with bilateral pleural effusions/bibasilar atelectasis unchanged to slightly worse. Tubes and lines as described. Electronically Signed   By:  Marin Olp M.D.   On: 05/09/2019 08:00    Labs: BMET Recent Labs  Lab 05/04/19 0429 05/04/19 0429 05/05/19 0427 05/06/19 0427 05/07/19 0416 05/08/19 0248 05/09/19 0340 05/09/19 1602 05/10/19 0400  NA 133*   < > 133* 134* 133* 135 131* 138 136  K 5.2*   < > 4.7 5.1 6.3* 5.4* 6.4* 3.7 4.6  CL 93*   < > 95* 97* 94* 98 94* 97* 97*  CO2 26   < > 28 27 27 27 26 31 29   GLUCOSE 156*   < > 122* 112* 188* 166* 194* 146* 51*  BUN 88*   < > 71* 98* 135* 95* 143* 82* 60*  CREATININE 4.95*   < > 3.79* 4.96* 5.87* 4.14* 5.36* 3.22* 2.91*  CALCIUM 7.9*   < > 7.9* 7.9* 8.5* 8.2* 8.5* 8.5* 8.4*  PHOS 4.5  --  3.0 3.5 3.0 3.2 3.6  --  3.5   < > = values in this interval not displayed.   CBC Recent Labs  Lab 05/07/19 2159 05/07/19 2159 05/08/19 0248 05/08/19 2030 05/09/19 0340 05/10/19 0400  WBC 11.0*  --  13.7*  --  14.0* 12.7*  HGB 7.3*   < > 7.1* 8.7* 8.7* 7.9*  HCT 24.7*   < > 24.3* 28.7* 28.9* 26.5*  MCV 99.2  --  101.7*  --  94.1 95.0  PLT 197  --  205  --  244 190   < > = values in this interval not displayed.    Medications:    . [START ON 05/14/2019] aspirin  81 mg Oral Daily  . calcium acetate (Phos Binder)  667 mg Per Tube TID WC  . chlorhexidine  15 mL Mouth Rinse BID  . Chlorhexidine Gluconate Cloth  6 each Topical Daily  . famotidine  10 mg Per Tube Daily  . feeding supplement (GLUCERNA 1.5 CAL)  237 mL Per Tube 6 X Daily  . feeding supplement (PRO-STAT SUGAR FREE 64)  30 mL Per Tube BID  . Gerhardt's butt cream   Topical BID  . heparin  4,000 Units Intravenous Once  . hydrocortisone   Rectal BID  . hydrocortisone  25 mg Rectal BID  . hydrOXYzine  10 mg Per Tube Q6H  . insulin aspart  0-15 Units Subcutaneous TID WC  . insulin aspart  0-5 Units Subcutaneous QHS  . insulin glargine  36 Units Subcutaneous Daily  . lipase/protease/amylase)  20,880 Units Per Tube Once   And  . sodium bicarbonate  650 mg Per Tube Once  . magic mouthwash  5 mL Oral TID  . mouth  rinse  15 mL Mouth Rinse q12n4p  . methylPREDNISolone (SOLU-MEDROL) injection  80 mg Intravenous Daily  . multivitamin  1 tablet Per Tube QHS  . nutrition supplement (JUVEN)  1 packet Per Tube BID BM  . simethicone  80 mg Oral QID  . sodium chloride flush  10-40 mL Intracatheter Q12H  . sodium chloride flush  10-40 mL Intracatheter Q12H  Jannifer Hick MD [facility] Pager [phone]

## 2019-05-10 NOTE — Progress Notes (Signed)
The chaplain responded to a code blue page. The chaplain sat with the son but the son did not need spiritual support at this time. The chaplain is available if needed.  Brion Aliment Chaplain Resident For questions concerning this note please contact me by pager [phone]

## 2019-05-11 ENCOUNTER — Inpatient Hospital Stay (HOSPITAL_COMMUNITY): Payer: Medicare Other

## 2019-05-11 DIAGNOSIS — I495 Sick sinus syndrome: Secondary | ICD-10-CM

## 2019-05-11 LAB — CBC
HCT: 28.3 % — ABNORMAL LOW (ref 39.0–52.0)
Hemoglobin: 8.4 g/dL — ABNORMAL LOW (ref 13.0–17.0)
MCH: 28.3 pg (ref 26.0–34.0)
MCHC: 29.7 g/dL — ABNORMAL LOW (ref 30.0–36.0)
MCV: 95.3 fL (ref 80.0–100.0)
Platelets: 214 10*3/uL (ref 150–400)
RBC: 2.97 MIL/uL — ABNORMAL LOW (ref 4.22–5.81)
RDW: 23.7 % — ABNORMAL HIGH (ref 11.5–15.5)
WBC: 12.3 10*3/uL — ABNORMAL HIGH (ref 4.0–10.5)
nRBC: 0.2 % (ref 0.0–0.2)

## 2019-05-11 LAB — RENAL FUNCTION PANEL
Albumin: 2.8 g/dL — ABNORMAL LOW (ref 3.5–5.0)
Anion gap: 13 (ref 5–15)
BUN: 112 mg/dL — ABNORMAL HIGH (ref 8–23)
CO2: 26 mmol/L (ref 22–32)
Calcium: 8.6 mg/dL — ABNORMAL LOW (ref 8.9–10.3)
Chloride: 97 mmol/L — ABNORMAL LOW (ref 98–111)
Creatinine, Ser: 4.22 mg/dL — ABNORMAL HIGH (ref 0.61–1.24)
GFR calc Af Amer: 14 mL/min — ABNORMAL LOW (ref >60)
GFR calc non Af Amer: 12 mL/min — ABNORMAL LOW (ref >60)
Glucose, Bld: 172 mg/dL — ABNORMAL HIGH (ref 70–99)
Phosphorus: 6.3 mg/dL — ABNORMAL HIGH (ref 2.5–4.6)
Potassium: 6.5 mmol/L (ref 3.5–5.1)
Sodium: 136 mmol/L (ref 135–145)

## 2019-05-11 LAB — GLUCOSE, CAPILLARY
Glucose-Capillary: 111 mg/dL — ABNORMAL HIGH (ref 70–99)
Glucose-Capillary: 132 mg/dL — ABNORMAL HIGH (ref 70–99)
Glucose-Capillary: 148 mg/dL — ABNORMAL HIGH (ref 70–99)
Glucose-Capillary: 153 mg/dL — ABNORMAL HIGH (ref 70–99)

## 2019-05-11 LAB — MAGNESIUM: Magnesium: 2.5 mg/dL — ABNORMAL HIGH (ref 1.7–2.4)

## 2019-05-11 MED ORDER — SODIUM ZIRCONIUM CYCLOSILICATE 10 G PO PACK
10.0000 g | PACK | Freq: Once | ORAL | Status: AC
  Administered 2019-05-11: 10 g via ORAL
  Filled 2019-05-11: qty 1

## 2019-05-11 MED ORDER — ALTEPLASE 2 MG IJ SOLR
2.0000 mg | Freq: Once | INTRAMUSCULAR | Status: AC
  Administered 2019-05-11: 2 mg
  Filled 2019-05-11: qty 2

## 2019-05-11 MED ORDER — SODIUM CHLORIDE 0.9 % IR SOLN
3000.0000 mL | Status: DC
  Administered 2019-05-12 – 2019-05-13 (×3): 3000 mL via INTRAVESICAL

## 2019-05-11 MED ORDER — HEPARIN SODIUM (PORCINE) 1000 UNIT/ML IJ SOLN
INTRAMUSCULAR | Status: AC
  Filled 2019-05-11: qty 2

## 2019-05-11 MED ORDER — HYDROXYZINE HCL 10 MG/5ML PO SYRP
10.0000 mg | ORAL_SOLUTION | Freq: Four times a day (QID) | ORAL | Status: DC | PRN
  Filled 2019-05-11: qty 5

## 2019-05-11 MED ORDER — PREDNISONE 20 MG PO TABS
40.0000 mg | ORAL_TABLET | Freq: Every day | ORAL | Status: AC
  Administered 2019-05-11 – 2019-05-12 (×2): 40 mg
  Filled 2019-05-11 (×2): qty 2

## 2019-05-11 MED ORDER — PREDNISONE 20 MG PO TABS
30.0000 mg | ORAL_TABLET | Freq: Every day | ORAL | Status: AC
  Administered 2019-05-13 – 2019-05-14 (×2): 30 mg
  Filled 2019-05-11 (×3): qty 1

## 2019-05-11 MED ORDER — PREDNISONE 20 MG PO TABS
20.0000 mg | ORAL_TABLET | Freq: Every day | ORAL | Status: AC
  Administered 2019-05-15 – 2019-05-16 (×2): 20 mg
  Filled 2019-05-11 (×2): qty 1

## 2019-05-11 MED ORDER — PREDNISONE 10 MG PO TABS
10.0000 mg | ORAL_TABLET | Freq: Every day | ORAL | Status: DC
  Administered 2019-05-17 – 2019-05-18 (×2): 10 mg
  Filled 2019-05-11 (×2): qty 1

## 2019-05-11 MED ORDER — PRO-STAT SUGAR FREE PO LIQD
60.0000 mL | Freq: Two times a day (BID) | ORAL | Status: DC
  Administered 2019-05-11 – 2019-05-15 (×8): 60 mL
  Filled 2019-05-11 (×8): qty 60

## 2019-05-11 MED ORDER — SODIUM CHLORIDE 0.9 % IV SOLN
INTRAVENOUS | Status: DC
  Filled 2019-05-11: qty 40
  Filled 2019-05-11: qty 30

## 2019-05-11 NOTE — Progress Notes (Signed)
Dr. Gloriann Loan at bedside to evaluate pt. Updated of pt condition. MD performed manual irrigation through urinary catheter using sterile water and irrigation syringe. Pt tolerated well. Multiple clots irrigated. No s/s of distress at this time.

## 2019-05-11 NOTE — Progress Notes (Signed)
Pt has been pacing over pacer, NSR in the 80's & BP stable. Epi gtt turned off. Tube feeds also turned off per Dr. Prescott Gum.

## 2019-05-11 NOTE — Progress Notes (Addendum)
[address]       [place] [address]             [phone]      11 Days Post-Op Procedure(s) (LRB): CYSTOSCOPY WITH FULGERATION (N/A) Subjective: No specific c/o  Objective: Vital signs in last 24 hours: Temp:  [98.4 F (36.9 C)-99.2 F (37.3 C)] 98.4 F (36.9 C) (03/26 1215) Pulse Rate:  [25-91] 70 (03/26 1330) Cardiac Rhythm: Normal sinus rhythm (03/26 1200) Resp:  [10-26] 10 (03/26 1330) BP: (91-153)/(40-113) 105/45 (03/26 1330) SpO2:  [89 %-100 %] 99 % (03/26 1330) Weight:  [87.3 kg-90.3 kg] 87.3 kg (03/26 1215)  Hemodynamic parameters for last 24 hours:    Intake/Output from previous day: 03/25 0701 - 03/26 0700 In: 13835.2 [I.V.:36.3; NG/GT:1600; IV Piggyback:198.9] Out: 08657 [Urine:16000; Chest Tube:575] Intake/Output this shift: Total I/O In: 6060 [Other:6000; NG/GT:60] Out: 7650 [Urine:4650; Other:3000]  General appearance: alert, cooperative, fatigued and no distress Heart: regular rate and rhythm Lungs: some upper airway ronchi, dim BS in bases Abdomen: benign Extremities: no edema Wound: incis healing well  Lab Results: Recent Labs    05/10/19 0400 05/10/19 0400 05/10/19 1804 05/11/19 0330  WBC 12.7*  --   --  12.3*  HGB 7.9*   < > 9.9* 8.4*  HCT 26.5*   < > 29.0* 28.3*  PLT 190  --   --  214   < > = values in this interval not displayed.   BMET:  Recent Labs    05/10/19 0400 05/10/19 0400 05/10/19 1804 05/11/19 0330  NA 136   < > 129* 136  K 4.6   < > 5.8* 6.5*  CL 97*  --   --  97*  CO2 29  --   --  26  GLUCOSE 51*  --   --  172*  BUN 60*  --   --  112*  CREATININE 2.91*  --   --  4.22*  CALCIUM 8.4*  --   --  8.6*   < > = values in this interval not displayed.    PT/INR: No results for input(s): LABPROT, INR in the last 72 hours. ABG    Component Value Date/Time   PHART 7.369 05/10/2019 1804   HCO3 20.0 05/10/2019 1804   TCO2 21 (L) 05/10/2019 1804   ACIDBASEDEF 5.0 (H) 05/10/2019 1804   O2SAT  99.0 05/10/2019 1804   CBG (last 3)  Recent Labs    05/10/19 2339 05/11/19 0705 05/11/19 1209  GLUCAP 202* 148* 111*    Meds Scheduled Meds: . [START ON 05/14/2019] aspirin  81 mg Oral Daily  . calcium acetate (Phos Binder)  667 mg Per Tube TID WC  . carboprost (HEMABATE) bladder irrigation - continuous   Bladder Irrigation Q24H   And  . [START ON 05/12/2019] sodium chloride irrigation  3,000 mL Bladder Irrigation Q24H  . chlorhexidine  15 mL Mouth Rinse BID  . Chlorhexidine Gluconate Cloth  6 each Topical Daily  . famotidine  10 mg Per Tube Daily  . feeding supplement (PRO-STAT SUGAR FREE 64)  60 mL Per Tube BID  . Gerhardt's butt cream   Topical BID  . heparin      . heparin  4,000 Units Intravenous Once  . hydrocortisone   Rectal BID  . hydrocortisone  25 mg Rectal BID  . insulin aspart  0-15 Units Subcutaneous TID WC  . insulin aspart  0-5 Units Subcutaneous QHS  . lipase/protease/amylase)  20,880 Units Per Tube Once   And  . sodium bicarbonate  650 mg Per Tube Once  . magic mouthwash  5 mL Oral TID  . mouth rinse  15 mL Mouth Rinse q12n4p  . multivitamin  1 tablet Per Tube QHS  . nutrition supplement (JUVEN)  1 packet Per Tube BID BM  . [START ON 05/15/2019] predniSONE  20 mg Per Tube Q breakfast   Followed by  . [START ON 05/17/2019] predniSONE  10 mg Per Tube Q breakfast  . predniSONE  40 mg Per Tube Q breakfast   Followed by  . [START ON 05/13/2019] predniSONE  30 mg Per Tube Q breakfast  . simethicone  80 mg Oral QID  . sodium chloride flush  10-40 mL Intracatheter Q12H  . sodium chloride flush  10-40 mL Intracatheter Q12H   Continuous Infusions: . sodium chloride    . sodium chloride    . sodium chloride Stopped (05/08/19 1833)  . epinephrine Stopped (05/11/19 0522)  . feeding supplement (JEVITY 1.5 CAL/FIBER) 1,000 mL (05/10/19 2024)  . ferric gluconate (FERRLECIT/NULECIT) IV 125 mg (05/11/19 1330)  . fluconazole (DIFLUCAN) IV 200 mg (05/10/19 1500)   PRN  Meds:.sodium chloride, sodium chloride, oxyCODONE **AND** acetaminophen, dextrose, hydrOXYzine, hyoscyamine, levalbuterol, lidocaine (PF), lidocaine-prilocaine, loperamide HCl, ondansetron (ZOFRAN) IV, pentafluoroprop-tetrafluoroeth, promethazine, Resource ThickenUp Clear, sodium chloride flush, sodium chloride flush, sodium chloride flush  Xrays DG Chest Port 1 View  Result Date: 05/11/2019 CLINICAL DATA:  CABG.  Sore chest. EXAM: PORTABLE CHEST 1 VIEW COMPARISON:  05/10/2019. FINDINGS: Feeding tube, right IJ pacing lead, left IJ dual-lumen catheter in stable position. A catheter, possibly a PICC line. Noted with tip over the right subclavian region. Prior CABG. Stable cardiomegaly. Bilateral pulmonary infiltrates/edema slightly progressed. Stable small bilateral pleural effusions. No pneumothorax. IMPRESSION: 1. A catheter, possibly a PICC line, noted with tip over the right subclavian region. Remaining lines and tubes in stable position. 2.  Prior CABG.  Cardiomegaly. 3. Bilateral pulmonary infiltrates/edema slightly progressed from prior exam. Stable small bilateral pleural effusions. No pneumothorax. Electronically Signed   By: Marcello Moores  Register   On: 05/11/2019 06:22   DG CHEST PORT 1 VIEW  Result Date: 05/10/2019 CLINICAL DATA:  New temporary pacemaker EXAM: PORTABLE CHEST 1 VIEW COMPARISON:  Portable exam 1838 hours compared to 05/09/2019 FINDINGS: Feeding tube extends into stomach. New RIGHT jugular pacing lead with tip projecting over superior RIGHT ventricle. LEFT jugular dual-lumen central venous catheter with tip projecting over SVC. External pacing leads present. Normal heart size post CABG. Mediastinal contours and pulmonary vascularity normal. Atherosclerotic calcification aorta. Bibasilar atelectasis and question layered effusions, decreased. No pneumothorax or new infiltrate identified. IMPRESSION: Improved bibasilar aeration. Electronically Signed   By: Lavonia Dana M.D.   On: 05/10/2019  18:57    Assessment/Plan: S/P Procedure(s) (LRB): CYSTOSCOPY WITH FULGERATION (N/A)  1 rhythm has stabilized, EP has consulted 2 urology f/u note , plan for carbiprost, cont bladder irrigation 3 AHF/nephrology guiding current management 4 H/H stable with melena and hematuria 5 minor leukocytosis, recent steroids, no fevers 6 BS pretty variable 7 conts cortrak feedings  LOS: 58 days    John Giovanni PA-C 05/11/2019 Pager [phone]  Bradycardia resolved- remove tem,p wire tomorrow and leave sleeve  Bladder irrigation clearing - seen by urol;ogy today  patient examined and medical record reviewed,agree with above note. Tharon Aquas Trigt III 05/11/2019

## 2019-05-11 NOTE — Progress Notes (Signed)
Nutrition Follow up   DOCUMENTATION CODES:   Not applicable  INTERVENTION:  Pt has shown little progression with PO intake despite TF decrease and multiple adjustments. Consider PEG as pt with [name] for almost two months.   Transition back to continuous:  -Jevity 1.5 @ 55 ml/hr via [name] (1320 ml) -60 ml Prostat BID -Juven BID  Provides:2380kcals, 144grams protein, 101ml free water. Meets 100% of needs. Decreased rate as PO intake progresses.   -Magic Cup TID  NUTRITION DIAGNOSIS:   Increased nutrient needs related to post-op healing as evidenced by estimated needs.  Ongoing  GOAL:   Patient will meet greater than or equal to 90% of their needs   Addressed via tube feeding   MONITOR:   Weight trends, Diet advancement, Vent status, Skin, TF tolerance, Labs, I & O's  REASON FOR ASSESSMENT:   Consult Enteral/tube feeding initiation and management  ASSESSMENT:   Patient with PMH significant for CAD s/p PCI, prostate cancer, HTN, CHF, and DM. Presents this admission with chronic a.fib with RVR.   1/28- s/p L heart cath  1/30- s/p CABG x3, MAZE, extubated  2/2- re-intubated  2/5- extubated 2/8- re-intubated  2/10- s/p impella, trach, NGT placed-start trickle feeding 2/11- start CRRT 2/23- removal of impella, [name] dislodged  2/24- CRRT stop 3/1- failed iHD, back on CRRT 3/2- s/p [name], nocturnal feedings started 3/4- diet advanced DYS2 , nectar thick  3/8- s/p L PleurX catheter and L TDC 3/10- tolerated iHD 3/13- s/p decannulation  3/15- s/p cystoscopy  3/17- [name] clogged, s/p replacement   Pt discussed during ICU rounds and with RN.   Had vagal event with CHB, emergent TVP placed. Continues with bladder irrigation. Potassium remains elevated, likely due to dialysate. Felt nauseous with change to bolus feedings. Changed back by MD this am.   Not eating much. Meal completions charted as 0-25% over the last week. NPO today.   Last HD today:  3000 ml net UF  Admission weight: 89.3 kg  Current weight: 87.3 kg   Medications: phoslyra, SS novolog, viokace, prednisone Labs: K 6.5 (H) Phosphorus 6.3 (H) Mg 2.5 (H) CBG 69-202  Diet Order:   Diet Order            DIET - DYS 1 Room service appropriate? Yes; Fluid consistency: Honey Thick  Diet effective now              EDUCATION NEEDS:   Not appropriate for education at this time  Skin:  Skin Assessment: Skin Integrity Issues: Skin Integrity Issues:: Stage I, DTI, Stage II DTI: chest, R heel Stage I: vertebral column Stage II: buttocks, L ear Incisions: chest, leg  Last BM:  3/25  Height:   Ht Readings from Last 1 Encounters:  03/26/19 5\' 8"  (1.727 m)    Weight:   Wt Readings from Last 1 Encounters:  05/11/19 87.3 kg    Ideal Body Weight:  70 kg  BMI:  Body mass index is 29.26 kg/m.  Estimated Nutritional Needs:   Kcal:  2200-2400 kcal  Protein:  135-155 grams  Fluid:  >/= 2.2 L/day   Mariana Single RD, LDN Clinical Nutrition Pager # - [phone]

## 2019-05-11 NOTE — Progress Notes (Signed)
Patient ID: Jesse Murphy, male   DOB: 27-Apr-1928, 86 y.o.   MRN: [id number]   [facility] Progress Note   Assessment/ Plan:   1. Acute kidney Injury: Appears hemodynamically mediated with ATN following postoperative congestive heart failure/cardiogenic shock.  On renal replacement therapy since 2/11 and appears to be tolerating intermittent hemodialysis without problems.  Difficulty chart urine output but no meaningful renal functional recovery noted on labs.  Continue hemodialysis on Monday/Wednesday/Friday schedule and will have vascular surgery evaluate him next week for permanent dialysis access given anticipation of long-term dialysis needs. 2.  Acute systolic heart failure/cardiogenic shock: Status post Impella support.  Tolerating hemodialysis for volume unloading at this time. 3.  Coronary artery disease status post three-vessel CABG 4.  Anemia: Secondary to acute/critical illness and acute blood loss with ongoing hematuria-off Aranesp for suspicion that this was inducing drug rash; will switch to Retacrit. 5.  Nutrition: Low albumin likely negative acute phase reactant, monitor with ongoing diet/ONS. 6.  Secondary hyperparathyroidism: Calcium and phosphorus levels currently at goal, monitor with HD.  Subjective:   Significant hyperkalemia noted on earlier labs-given Lokelma.  Will adjust dialysate.   Objective:   BP (!) 137/57   Pulse 80   Temp 98.4 F (36.9 C) (Oral)   Resp (!) 23   Ht 5\' 8"  (1.727 m)   Wt 88.5 kg   SpO2 99%   BMI 29.67 kg/m   Intake/Output Summary (Last 24 hours) at 05/11/2019 0823 Last data filed at 05/11/2019 0800 Gross per 24 hour  Intake 13835.23 ml  Output 16575 ml  Net -2739.77 ml   Weight change: -0.2 kg  Physical Exam: Gen: Appears comfortable resting in bed, on hemodialysis CVS: Pulse regular rhythm, normal rate, S1 and S2 normal Resp: Anteriorly clear to auscultation, no rales.  Left IJ TDC Abd: Soft, obese, nontender Ext:  Trace dependent edema.  Imaging: DG Chest Port 1 View  Result Date: 05/11/2019 CLINICAL DATA:  CABG.  Sore chest. EXAM: PORTABLE CHEST 1 VIEW COMPARISON:  05/10/2019. FINDINGS: Feeding tube, right IJ pacing lead, left IJ dual-lumen catheter in stable position. A catheter, possibly a PICC line. Noted with tip over the right subclavian region. Prior CABG. Stable cardiomegaly. Bilateral pulmonary infiltrates/edema slightly progressed. Stable small bilateral pleural effusions. No pneumothorax. IMPRESSION: 1. A catheter, possibly a PICC line, noted with tip over the right subclavian region. Remaining lines and tubes in stable position. 2.  Prior CABG.  Cardiomegaly. 3. Bilateral pulmonary infiltrates/edema slightly progressed from prior exam. Stable small bilateral pleural effusions. No pneumothorax. Electronically Signed   By: Marcello Moores  Register   On: 05/11/2019 06:22   DG CHEST PORT 1 VIEW  Result Date: 05/10/2019 CLINICAL DATA:  New temporary pacemaker EXAM: PORTABLE CHEST 1 VIEW COMPARISON:  Portable exam 1838 hours compared to 05/09/2019 FINDINGS: Feeding tube extends into stomach. New RIGHT jugular pacing lead with tip projecting over superior RIGHT ventricle. LEFT jugular dual-lumen central venous catheter with tip projecting over SVC. External pacing leads present. Normal heart size post CABG. Mediastinal contours and pulmonary vascularity normal. Atherosclerotic calcification aorta. Bibasilar atelectasis and question layered effusions, decreased. No pneumothorax or new infiltrate identified. IMPRESSION: Improved bibasilar aeration. Electronically Signed   By: Lavonia Dana M.D.   On: 05/10/2019 18:57    Labs: [facility] Recent Labs  Lab 05/05/19 0427 05/05/19 0427 05/06/19 0427 05/06/19 0427 05/07/19 0416 05/08/19 0248 05/09/19 0340 05/09/19 1602 05/10/19 0400 05/10/19 1804 05/11/19 0330  NA 133*   < > 134*   < >  133* 135 131* 138 136 129* 136  K 4.7   < > 5.1   < > 6.3* 5.4* 6.4* 3.7 4.6 5.8*  6.5*  CL 95*   < > 97*  --  94* 98 94* 97* 97*  --  97*  CO2 28   < > 27  --  27 27 26 31 29   --  26  GLUCOSE 122*   < > 112*  --  188* 166* 194* 146* 51*  --  172*  BUN 71*   < > 98*  --  135* 95* 143* 82* 60*  --  112*  CREATININE 3.79*   < > 4.96*  --  5.87* 4.14* 5.36* 3.22* 2.91*  --  4.22*  CALCIUM 7.9*   < > 7.9*  --  8.5* 8.2* 8.5* 8.5* 8.4*  --  8.6*  PHOS 3.0  --  3.5  --  3.0 3.2 3.6  --  3.5  --  6.3*   < > = values in this interval not displayed.   CBC Recent Labs  Lab 05/08/19 0248 05/08/19 2030 05/09/19 0340 05/10/19 0400 05/10/19 1804 05/11/19 0330  WBC 13.7*  --  14.0* 12.7*  --  12.3*  HGB 7.1*   < > 8.7* 7.9* 9.9* 8.4*  HCT 24.3*   < > 28.9* 26.5* 29.0* 28.3*  MCV 101.7*  --  94.1 95.0  --  95.3  PLT 205  --  244 190  --  214   < > = values in this interval not displayed.    Medications:    . [START ON 05/14/2019] aspirin  81 mg Oral Daily  . calcium acetate (Phos Binder)  667 mg Per Tube TID WC  . chlorhexidine  15 mL Mouth Rinse BID  . Chlorhexidine Gluconate Cloth  6 each Topical Daily  . famotidine  10 mg Per Tube Daily  . feeding supplement (PRO-STAT SUGAR FREE 64)  30 mL Per Tube BID  . Gerhardt's butt cream   Topical BID  . heparin      . heparin  4,000 Units Intravenous Once  . hydrocortisone   Rectal BID  . hydrocortisone  25 mg Rectal BID  . hydrOXYzine  10 mg Per Tube Q6H  . insulin aspart  0-15 Units Subcutaneous TID WC  . insulin aspart  0-5 Units Subcutaneous QHS  . insulin glargine  24 Units Subcutaneous Daily  . lipase/protease/amylase)  20,880 Units Per Tube Once   And  . sodium bicarbonate  650 mg Per Tube Once  . magic mouthwash  5 mL Oral TID  . mouth rinse  15 mL Mouth Rinse q12n4p  . methylPREDNISolone (SOLU-MEDROL) injection  80 mg Intravenous Daily  . multivitamin  1 tablet Per Tube QHS  . nutrition supplement (JUVEN)  1 packet Per Tube BID BM  . simethicone  80 mg Oral QID  . sodium chloride flush  10-40 mL Intracatheter  Q12H  . sodium chloride flush  10-40 mL Intracatheter Q12H   Elmarie Shiley, MD 05/11/2019, 8:23 AM

## 2019-05-11 NOTE — Progress Notes (Signed)
Urology Inpatient Progress Report  Atrial fibrillation with rapid ventricular response (HCC) [I48.91] Atrial fibrillation with RVR (HCC) [I48.91] S/P CABG x 3 [Z95.1]  Procedure(s): CYSTOSCOPY WITH FULGERATION  11 Days Post-Op   Intv/Subj: Patient is status post cystoscopy with bladder fulguration about 11 days ago.  He is also status post alum irrigation on 03/21.  He had recurrence of hematuria with clot yesterday.  He had to be irrigated.  CBI was restarted.   Hemoglobin dropped from 9.9-8.4.  He is on CR RT for renal failure.  Principal Problem:   Atrial fibrillation with rapid ventricular response (HCC) Active Problems:   PAF (paroxysmal atrial fibrillation) (HCC)   GERD (gastroesophageal reflux disease)   Chronic anticoagulation   CAD S/P percutaneous coronary angioplasty   Type 2 diabetes mellitus without complications (HCC)   Non Hodgkin's lymphoma (HCC)   Chronic diastolic heart failure (HCC)   Hypertension   Atrial fibrillation with RVR (HCC)   Non-ST elevation (NSTEMI) myocardial infarction (HCC)   Acute on chronic combined systolic and diastolic CHF (congestive heart failure) (HCC)   Coronary artery disease due to lipid rich plaque   S/P CABG x 3   Acute respiratory failure with hypoxia (HCC)   Pressure injury of skin   Palliative care encounter   AKI (acute kidney injury) ([place])  Current Facility-Administered Medications  Medication Dose Route Frequency Provider Last Rate Last Admin  . 0.9 %  sodium chloride infusion  100 mL Intravenous PRN Irine Seal, MD      . 0.9 %  sodium chloride infusion  100 mL Intravenous PRN Irine Seal, MD      . 0.9 %  sodium chloride infusion   Intravenous Continuous Irine Seal, MD   Stopped at 05/08/19 1833  . oxyCODONE (ROXICODONE) 5 MG/5ML solution 5 mg  5 mg Per Tube Q4H PRN Ivin Poot, MD   5 mg at 05/09/19 2151   And  . acetaminophen (TYLENOL) 160 MG/5ML solution 325 mg  325 mg Per Tube Q4H PRN Ivin Poot, MD    325 mg at 05/08/19 2126  . [START ON 05/14/2019] aspirin chewable tablet 81 mg  81 mg Oral Daily Prescott Gum, Collier Salina, MD      . calcium acetate (Phos Binder) [facility]) 667 MG/5ML oral solution 667 mg  667 mg Per Tube TID WC Ivin Poot, MD   667 mg at 05/10/19 1331  . carboprost (HEMABATE) 10 mg in sodium chloride 0.9 % 1,000 mL (1 mg%) irrigation   Bladder Irrigation Q24H Marton Redwood III, MD       And  . Derrill Memo ON 05/12/2019] sodium chloride irrigation 0.9 % 3,000 mL  3,000 mL Bladder Irrigation Q24H Marton Redwood III, MD      . chlorhexidine (PERIDEX) 0.12 % solution 15 mL  15 mL Mouth Rinse BID Irine Seal, MD   15 mL at 05/11/19 0918  . Chlorhexidine Gluconate Cloth 2 % PADS 6 each  6 each Topical Daily Ivin Poot, MD   6 each at 05/10/19 1544  . dextrose 50 % solution 0-50 mL  0-50 mL Intravenous PRN Irine Seal, MD      . EPINEPHrine (ADRENALIN) 4 mg in NS 250 mL (0.016 mg/mL) premix infusion  2 mcg/min Intravenous Titrated Ivin Poot, MD   Stopped at 05/11/19 0522  . famotidine (PEPCID) tablet 10 mg  10 mg Per Tube Daily Donnamae Jude, RPH   10 mg at 05/11/19 1019  . feeding  supplement (JEVITY 1.5 CAL/FIBER) liquid 1,000 mL  1,000 mL Oral Continuous Ivin Poot, MD 55 mL/hr at 05/10/19 2024 1,000 mL at 05/10/19 2024  . feeding supplement (PRO-STAT SUGAR FREE 64) liquid 60 mL  60 mL Per Tube BID Prescott Gum, Collier Salina, MD      . ferric gluconate (NULECIT) 125 mg in sodium chloride 0.9 % 100 mL IVPB  125 mg Intravenous Q M,W,F-HD Madelon Lips, MD 110 mL/hr at 05/11/19 1330 125 mg at 05/11/19 1330  . fluconazole (DIFLUCAN) IVPB 200 mg  200 mg Intravenous Q24H Wilford Corner, MD 100 mL/hr at 05/10/19 1500 200 mg at 05/10/19 1500  . Gerhardt's butt cream   Topical BID Irine Seal, MD   Given at 05/11/19 0919  . heparin 1000 UNIT/ML injection           . heparin injection 4,000 Units  4,000 Units Intravenous Once Justin Mend, MD      . hydrocortisone (ANUSOL-HC) 2.5 %  rectal cream   Rectal BID Irine Seal, MD   Given at 05/11/19 0919  . hydrocortisone (ANUSOL-HC) suppository 25 mg  25 mg Rectal BID Prescott Gum, Collier Salina, MD   25 mg at 05/11/19 1320  . hydrOXYzine (ATARAX) 10 MG/5ML syrup 10 mg  10 mg Per Tube Q6H PRN Bensimhon, Shaune Pascal, MD      . hyoscyamine (LEVSIN SL) SL tablet 0.125 mg  0.125 mg Sublingual Q6H PRN Irine Seal, MD      . insulin aspart (novoLOG) injection 0-15 Units  0-15 Units Subcutaneous TID WC Ivin Poot, MD   Stopped at 05/11/19 [phone]  . insulin aspart (novoLOG) injection 0-5 Units  0-5 Units Subcutaneous QHS Ivin Poot, MD   2 Units at 05/10/19 2341  . levalbuterol (XOPENEX) nebulizer solution 0.63 mg  0.63 mg Nebulization Q6H PRN Irine Seal, MD   0.63 mg at 04/26/19 1353  . lidocaine (PF) (XYLOCAINE) 1 % injection 5 mL  5 mL Intradermal PRN Irine Seal, MD      . lidocaine-prilocaine (EMLA) cream 1 application  1 application Topical PRN Irine Seal, MD      . lipase/protease/amylase) Rosann Auerbach) tablets 20,880 Units  20,880 Units Per Tube Once Prescott Gum, Collier Salina, MD       And  . sodium bicarbonate tablet 650 mg  650 mg Per Tube Once Prescott Gum, Collier Salina, MD      . loperamide HCl (IMODIUM) 1 MG/7.5ML suspension 4 mg  4 mg Per Tube PRN Ivin Poot, MD      . magic mouthwash  5 mL Oral TID Prescott Gum, Collier Salina, MD   5 mL at 05/11/19 0918  . MEDLINE mouth rinse  15 mL Mouth Rinse q12n4p Irine Seal, MD   15 mL at 05/11/19 1311  . multivitamin (RENA-VIT) tablet 1 tablet  1 tablet Per Tube QHS Ivin Poot, MD   1 tablet at 05/10/19 2218  . nutrition supplement (JUVEN) (JUVEN) powder packet 1 packet  1 packet Per Tube BID BM Ivin Poot, MD   Stopped at 05/11/19 1014  . ondansetron (ZOFRAN) injection 4 mg  4 mg Intravenous Q6H PRN Irine Seal, MD   4 mg at 05/10/19 1721  . pentafluoroprop-tetrafluoroeth (GEBAUERS) aerosol 1 application  1 application Topical PRN Irine Seal, MD      . Derrill Memo ON 05/15/2019] predniSONE (DELTASONE)  tablet 20 mg  20 mg Per Tube Q breakfast Bensimhon, Shaune Pascal, MD       Followed by  . [  START ON 05/17/2019] predniSONE (DELTASONE) tablet 10 mg  10 mg Per Tube Q breakfast Bensimhon, Shaune Pascal, MD      . predniSONE (DELTASONE) tablet 40 mg  40 mg Per Tube Q breakfast Bensimhon, Shaune Pascal, MD   40 mg at 05/11/19 1019   Followed by  . [START ON 05/13/2019] predniSONE (DELTASONE) tablet 30 mg  30 mg Per Tube Q breakfast Bensimhon, Shaune Pascal, MD      . promethazine (PHENERGAN) injection 6.25 mg  6.25 mg Intravenous Q6H PRN Irine Seal, MD   6.25 mg at 05/08/19 1011  . Resource ThickenUp Clear   Oral PRN Irine Seal, MD   Given at 05/02/19 [id number]  . simethicone (MYLICON) chewable tablet 80 mg  80 mg Oral QID Ivin Poot, MD   Stopped at 05/11/19 1015  . sodium chloride flush (NS) 0.9 % injection 10-40 mL  10-40 mL Intracatheter Q12H Irine Seal, MD   10 mL at 05/10/19 1023  . sodium chloride flush (NS) 0.9 % injection 10-40 mL  10-40 mL Intracatheter PRN Irine Seal, MD      . sodium chloride flush (NS) 0.9 % injection 10-40 mL  10-40 mL Intracatheter Q12H Prescott Gum, Collier Salina, MD   10 mL at 05/11/19 1014  . sodium chloride flush (NS) 0.9 % injection 10-40 mL  10-40 mL Intracatheter PRN Prescott Gum, Collier Salina, MD      . sodium chloride flush (NS) 0.9 % injection 3 mL  3 mL Intravenous PRN Irine Seal, MD   3 mL at 04/16/19 0954     Objective: Vital: Vitals:   05/11/19 1215 05/11/19 1230 05/11/19 1300 05/11/19 1330  BP: (!) 110/57 (!) 123/52 (!) 124/46 (!) 105/45  Pulse: 79 80 73 70  Resp: 18 17 10 10   Temp: 98.4 F (36.9 C)     TempSrc: Oral     SpO2: 100% 100% 100% 99%  Weight: 87.3 kg     Height:       I/Os: I/O last 3 completed shifts: In: 17132.2 [I.V.:36.3; Other:15000; NG/GT:1897; IV Piggyback:198.9] Out: 28413 [KGMWN:02725; Chest Tube:575]  Physical Exam:  General: Patient is in no apparent distress Lungs: Normal respiratory effort, chest expands symmetrically. GI: The abdomen is soft and  nontender without mass. Foley:   Three way Foley catheter in place draining pink urine on slow CBI Ext: lower extremities symmetric  Lab Results: Recent Labs    05/09/19 0340 05/09/19 0340 05/10/19 0400 05/10/19 1804 05/11/19 0330  WBC 14.0*  --  12.7*  --  12.3*  HGB 8.7*   < > 7.9* 9.9* 8.4*  HCT 28.9*   < > 26.5* 29.0* 28.3*   < > = values in this interval not displayed.   Recent Labs    05/09/19 1602 05/09/19 1602 05/10/19 0400 05/10/19 1804 05/11/19 0330  NA 138   < > 136 129* 136  K 3.7   < > 4.6 5.8* 6.5*  CL 97*  --  97*  --  97*  CO2 31  --  29  --  26  GLUCOSE 146*  --  51*  --  172*  BUN 82*  --  60*  --  112*  CREATININE 3.22*  --  2.91*  --  4.22*  CALCIUM 8.5*  --  8.4*  --  8.6*   < > = values in this interval not displayed.   No results for input(s): LABPT, INR in the last 72 hours. No results for input(s): LABURIN in  the last 72 hours. Results for orders placed or performed during the hospital encounter of 03/13/19  Respiratory Panel by RT PCR (Flu A&B, Covid) - Nasopharyngeal Swab     Status: None   Collection Time: 03/13/19  1:49 PM   Specimen: Nasopharyngeal Swab  Result Value Ref Range Status   SARS Coronavirus 2 by RT PCR NEGATIVE NEGATIVE Final    Comment: (NOTE) SARS-CoV-2 target nucleic acids are NOT DETECTED. The SARS-CoV-2 RNA is generally detectable in upper respiratoy specimens during the acute phase of infection. The lowest concentration of SARS-CoV-2 viral copies this assay can detect is 131 copies/mL. A negative result does not preclude SARS-Cov-2 infection and should not be used as the sole basis for treatment or other patient management decisions. A negative result may occur with  improper specimen collection/handling, submission of specimen other than nasopharyngeal swab, presence of viral mutation(s) within the areas targeted by this assay, and inadequate number of viral copies (<131 copies/mL). A negative result must be  combined with clinical observations, patient history, and epidemiological information. The expected result is Negative. Fact Sheet for Patients:  PinkCheek.be Fact Sheet for Healthcare Providers:  GravelBags.it This test is not yet ap proved or cleared by the [place] FDA and  has been authorized for detection and/or diagnosis of SARS-CoV-2 by FDA under an Emergency Use Authorization (EUA). This EUA will remain  in effect (meaning this test can be used) for the duration of the COVID-19 declaration under Section 564(b)(1) of the Act, 21 U.S.C. section 360bbb-3(b)(1), unless the authorization is terminated or revoked sooner.    Influenza A by PCR NEGATIVE NEGATIVE Final   Influenza B by PCR NEGATIVE NEGATIVE Final    Comment: (NOTE) The Xpert Xpress SARS-CoV-2/FLU/RSV assay is intended as an aid in  the diagnosis of influenza from Nasopharyngeal swab specimens and  should not be used as a sole basis for treatment. Nasal washings and  aspirates are unacceptable for Xpert Xpress SARS-CoV-2/FLU/RSV  testing. Fact Sheet for Patients: PinkCheek.be Fact Sheet for Healthcare Providers: GravelBags.it This test is not yet approved or cleared by the [place] FDA and  has been authorized for detection and/or diagnosis of SARS-CoV-2 by  FDA under an Emergency Use Authorization (EUA). This EUA will remain  in effect (meaning this test can be used) for the duration of the  Covid-19 declaration under Section 564(b)(1) of the Act, 21  U.S.C. section 360bbb-3(b)(1), unless the authorization is  terminated or revoked. Performed at [facility], [address], [place], [place] [address]   MRSA PCR Screening     Status: None   Collection Time: 03/13/19  5:18 PM   Specimen: Nasal Mucosa; Nasopharyngeal  Result Value Ref Range Status   MRSA by PCR NEGATIVE NEGATIVE Final     Comment:        The GeneXpert MRSA Assay (FDA approved for NASAL specimens only), is one component of a comprehensive MRSA colonization surveillance program. It is not intended to diagnose MRSA infection nor to guide or monitor treatment for MRSA infections. Performed at [facility], [address], [place], [place] [address]   Surgical pcr screen     Status: None   Collection Time: 03/15/19  9:28 PM   Specimen: Nasal Mucosa; Nasal Swab  Result Value Ref Range Status   MRSA, PCR NEGATIVE NEGATIVE Final   Staphylococcus aureus NEGATIVE NEGATIVE Final    Comment: (NOTE) The Xpert SA Assay (FDA approved for NASAL specimens in patients 70 years of age and  older), is one component of a comprehensive surveillance program. It is not intended to diagnose infection nor to guide or monitor treatment. Performed at [facility], [place] [address], [place], [place] [address]   Culture, respiratory (non-expectorated)     Status: None   Collection Time: 03/19/19  3:00 PM   Specimen: Tracheal Aspirate; Respiratory  Result Value Ref Range Status   Specimen Description TRACHEAL ASPIRATE  Final   Special Requests Normal  Final   Gram Stain   Final    ABUNDANT WBC PRESENT,BOTH PMN AND MONONUCLEAR FEW GRAM POSITIVE COCCI RARE GRAM NEGATIVE RODS    Culture   Final    FEW Consistent with normal respiratory flora. Performed at [facility], [place] [address], [place], [place] [address]    Report Status 03/21/2019 FINAL  Final  Culture, respiratory (non-expectorated)     Status: None   Collection Time: 03/26/19  6:29 AM   Specimen: Tracheal Aspirate; Respiratory  Result Value Ref Range Status   Specimen Description TRACHEAL ASPIRATE  Final   Special Requests NONE  Final   Gram Stain   Final    ABUNDANT WBC PRESENT,BOTH PMN AND MONONUCLEAR NO ORGANISMS SEEN Performed at [facility], [address], [place], [place] [address]    Culture FEW PSEUDOMONAS AERUGINOSA  Final    Report Status 03/28/2019 FINAL  Final   Organism ID, Bacteria PSEUDOMONAS AERUGINOSA  Final      Susceptibility   Pseudomonas aeruginosa - MIC*    CEFTAZIDIME 8 SENSITIVE Sensitive     CIPROFLOXACIN <=0.25 SENSITIVE Sensitive     GENTAMICIN 4 SENSITIVE Sensitive     IMIPENEM 2 SENSITIVE Sensitive     * FEW PSEUDOMONAS AERUGINOSA  Culture, respiratory (non-expectorated)     Status: None   Collection Time: 04/03/19 11:08 AM   Specimen: Tracheal Aspirate; Respiratory  Result Value Ref Range Status   Specimen Description TRACHEAL ASPIRATE  Final   Special Requests NONE  Final   Gram Stain   Final    FEW WBC PRESENT, PREDOMINANTLY PMN FEW GRAM NEGATIVE RODS RARE YEAST    Culture   Final    FEW PSEUDOMONAS AERUGINOSA FEW CANDIDA TROPICALIS    Report Status 04/06/2019 FINAL  Final   Organism ID, Bacteria PSEUDOMONAS AERUGINOSA  Final      Susceptibility   Pseudomonas aeruginosa - MIC*    CEFTAZIDIME 32 RESISTANT Resistant     CIPROFLOXACIN <=0.25 SENSITIVE Sensitive     GENTAMICIN 4 SENSITIVE Sensitive     IMIPENEM Value in next row Sensitive      2 SENSITIVEPerformed at [facility], [address], [place], [place] [address]    * FEW PSEUDOMONAS AERUGINOSA  Culture, blood (Routine X 2) w Reflex to ID Panel     Status: None   Collection Time: 04/03/19 12:30 PM   Specimen: BLOOD LEFT HAND  Result Value Ref Range Status   Specimen Description BLOOD LEFT HAND  Final   Special Requests   Final    BOTTLES DRAWN AEROBIC ONLY Blood Culture results may not be optimal due to an inadequate volume of blood received in culture bottles   Culture   Final    NO GROWTH 5 DAYS Performed at [facility], [place] [address] [place], [place] [address]    Report Status 04/08/2019 FINAL  Final  Culture, blood (Routine X 2) w Reflex to ID Panel     Status: None   Collection  Time: 04/03/19 12:35 PM   Specimen: BLOOD LEFT HAND  Result Value Ref Range Status   Specimen Description BLOOD  LEFT HAND  Final   Special Requests   Final    BOTTLES DRAWN AEROBIC ONLY Blood Culture results may not be optimal due to an inadequate volume of blood received in culture bottles   Culture   Final    NO GROWTH 5 DAYS Performed at [facility], [place] [address], [place], [place] [address]    Report Status 04/08/2019 FINAL  Final  Culture, respiratory (non-expectorated)     Status: None   Collection Time: 04/07/19 11:10 AM   Specimen: Tracheal Aspirate; Respiratory  Result Value Ref Range Status   Specimen Description TRACHEAL ASPIRATE  Final   Special Requests NONE  Final   Gram Stain   Final    MODERATE WBC PRESENT, PREDOMINANTLY PMN ABUNDANT GRAM NEGATIVE RODS Performed at [facility], [place] [address] [place], [place] [address]    Culture ABUNDANT PSEUDOMONAS AERUGINOSA  Final   Report Status 04/09/2019 FINAL  Final   Organism ID, Bacteria PSEUDOMONAS AERUGINOSA  Final      Susceptibility   Pseudomonas aeruginosa - MIC*    CEFTAZIDIME 16 INTERMEDIATE Intermediate     CIPROFLOXACIN <=0.25 SENSITIVE Sensitive     GENTAMICIN 4 SENSITIVE Sensitive     IMIPENEM 1 SENSITIVE Sensitive     * ABUNDANT PSEUDOMONAS AERUGINOSA  Culture, blood (routine x 2)     Status: None   Collection Time: 04/07/19  2:07 PM   Specimen: BLOOD LEFT HAND  Result Value Ref Range Status   Specimen Description BLOOD LEFT HAND  Final   Special Requests   Final    BOTTLES DRAWN AEROBIC ONLY Blood Culture adequate volume Performed at [facility], [place] [address] [place], [place] [address]    Culture NO GROWTH 5 DAYS  Final   Report Status 04/12/2019 FINAL  Final  Culture, blood (routine x 2)     Status: None   Collection Time: 04/07/19  2:07 PM   Specimen: BLOOD LEFT HAND  Result Value Ref Range Status   Specimen Description BLOOD LEFT HAND  Final   Special Requests   Final    BOTTLES DRAWN AEROBIC ONLY Blood Culture adequate volume Performed at [facility], [place] [address], [place],  [address]    Culture NO GROWTH 5 DAYS  Final   Report Status 04/12/2019 FINAL  Final  Culture, Urine     Status: None   Collection Time: 04/13/19  6:58 PM   Specimen: Urine, Catheterized  Result Value Ref Range Status   Specimen Description URINE, CATHETERIZED  Final   Special Requests NONE  Final   Culture   Final    NO GROWTH Performed at [facility], [address] [place],  [address]    Report Status 04/14/2019 FINAL  Final  MRSA PCR Screening     Status: None   Collection Time: 04/15/19 12:53 PM   Specimen: Nasal Mucosa; Nasopharyngeal  Result Value Ref Range Status   MRSA by PCR NEGATIVE NEGATIVE Final    Comment:        The GeneXpert MRSA Assay (FDA approved for NASAL specimens only), is one component of a comprehensive MRSA colonization surveillance program. It is not intended to diagnose MRSA infection nor to guide or monitor treatment for MRSA infections. Performed at [facility], [place] [address], [place], [place]  [address]   Culture, respiratory (non-expectorated)     Status: None   Collection Time: 04/20/19 11:35 AM   Specimen: Tracheal Aspirate; Respiratory  Result Value Ref Range Status   Specimen Description TRACHEAL ASPIRATE  Final   Special Requests Normal  Final   Gram Stain   Final    RARE WBC PRESENT, PREDOMINANTLY PMN FEW GRAM NEGATIVE RODS FEW GRAM POSITIVE RODS Performed at [facility], [place] [address] [place], [place] [address]    Culture MODERATE PSEUDOMONAS AERUGINOSA  Final   Report Status 04/23/2019 FINAL  Final   Organism ID, Bacteria PSEUDOMONAS AERUGINOSA  Final      Susceptibility   Pseudomonas aeruginosa - MIC*    CEFTAZIDIME >=64 RESISTANT Resistant     CIPROFLOXACIN <=0.25 SENSITIVE Sensitive     GENTAMICIN 4 SENSITIVE Sensitive     IMIPENEM >=16 RESISTANT Resistant     * MODERATE PSEUDOMONAS AERUGINOSA  Body fluid culture     Status: None   Collection Time: 04/23/19 12:02 PM    Specimen: Pleural, Left; Body Fluid  Result Value Ref Range Status   Specimen Description PLEURAL LEFT  Final   Special Requests NONE  Final   Gram Stain   Final    ABUNDANT WBC PRESENT,BOTH PMN AND MONONUCLEAR NO ORGANISMS SEEN    Culture   Final    NO GROWTH 3 DAYS Performed at [facility], [address], [place], [place] [address]    Report Status 04/26/2019 FINAL  Final    Studies/Results: DG Chest Port 1 View  Result Date: 05/11/2019 CLINICAL DATA:  CABG.  Sore chest. EXAM: PORTABLE CHEST 1 VIEW COMPARISON:  05/10/2019. FINDINGS: Feeding tube, right IJ pacing lead, left IJ dual-lumen catheter in stable position. A catheter, possibly a PICC line. Noted with tip over the right subclavian region. Prior CABG. Stable cardiomegaly. Bilateral pulmonary infiltrates/edema slightly progressed. Stable small bilateral pleural effusions. No pneumothorax. IMPRESSION: 1. A catheter, possibly a PICC line, noted with tip over the right subclavian region. Remaining lines and tubes in stable position. 2.  Prior CABG.  Cardiomegaly. 3. Bilateral pulmonary infiltrates/edema slightly progressed from prior exam. Stable small bilateral pleural effusions. No pneumothorax. Electronically Signed   By: Marcello Moores  Register   On: 05/11/2019 06:22   DG CHEST PORT 1 VIEW  Result Date: 05/10/2019 CLINICAL DATA:  New temporary pacemaker EXAM: PORTABLE CHEST 1 VIEW COMPARISON:  Portable exam 1838 hours compared to 05/09/2019 FINDINGS: Feeding tube extends into stomach. New RIGHT jugular pacing lead with tip projecting over superior RIGHT ventricle. LEFT jugular dual-lumen central venous catheter with tip projecting over SVC. External pacing leads present. Normal heart size post CABG. Mediastinal contours and pulmonary vascularity normal. Atherosclerotic calcification aorta. Bibasilar atelectasis and question layered effusions, decreased. No pneumothorax or new infiltrate identified. IMPRESSION: Improved bibasilar  aeration. Electronically Signed   By: Lavonia Dana M.D.   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  Continue to monitor hemoglobin.   Link Snuffer, MD Urology 05/11/2019, 2:00 PM

## 2019-05-11 NOTE — Progress Notes (Signed)
OT Cancellation Note  Patient Details Name: Jesse Murphy MRN: [id number] DOB: 02-10-1929   Cancelled Treatment:    Reason Eval/Treat Not Completed: Other (comment) Pt with recent placement of temporary pacer due to vagal response during procedure. Dr. Prescott Gum requesting hold until pacer is pulled. Will continue to follow.  Zenovia Jarred, MSOT, OTR/L Acute Rehabilitation Services [facility] Office Number: [phone] Pager: [phone]  Zenovia Jarred 05/11/2019, 4:14 PM

## 2019-05-11 NOTE — Procedures (Signed)
Patient seen on Hemodialysis. BP (!) 137/57   Pulse 80   Temp 98.4 F (36.9 C) (Oral)   Resp (!) 23   Ht 5\' 8"  (1.727 m)   Wt 88.5 kg   SpO2 99%   BMI 29.67 kg/m   QB 300, UF goal 3L Tolerating treatment without complaints at this time.   Elmarie Shiley MD [facility] Office # [phone] Pager # [phone] 8:29 AM

## 2019-05-11 NOTE — Progress Notes (Signed)
  Speech Language Pathology Treatment: Dysphagia  Patient Details Name: Jesse Murphy MRN: [id number] DOB: [birth year] Today's Date: 05/11/2019 Time: 0175-1025 SLP Time Calculation (min) (ACUTE ONLY): 12 min  Assessment / Plan / Recommendation Clinical Impression  Pt was seen for dysphagia treatment. He was NPO for possible procedure so p.o. trials were deferred. Pt's son reported that the pt demonstrates coughing with honey thick liquids if he is not seated upright and that his p.o. intake has been poor. Pt's son was educated regarding the purpose and demonstration of dysphagia exercises and he verbalized understanding. Pt was able to independently recall one of three exercises and expressed that he has not seen RMST device in room recently. With cues, he was able to recall additional exercises and they were modeled to the pt and his son. Pt requested that his completion of exercises be deferred due to fatigue from dialysis this morning. Pt's son was able to demonstrate dysphagia exercises accurately and indicated that he will complete some of them with the pt between SLP treatment sessions. SLP will continue to follow pt.    HPI HPI: 86 y.o. male with PMH: h/o CAD s/p CABG and clipping of left atrial appendage, who presented with SOB on 2/1. He became hypoxic on 2/2 with pulmonary edema and needed to be intubated on 2/2 and extubated 2/5. Reintubated 2/8.  Pt underwent impella placement and tracheostomy on 2/9. Pt started on CRRT 2/11. Impella removal 2/23. Pt decannulated 3/13.  MBS 3/16 with penetration of nectar to the vocal folds without sensation.        SLP Plan  Continue with current plan of care       Recommendations  Diet recommendations: Dysphagia 1 (puree);Honey-thick liquid Liquids provided via: Cup;Straw Medication Administration: Whole meds with puree Supervision: Full supervision/cueing for compensatory strategies;Staff to assist with self feeding Compensations: Slow  rate;Small sips/bites Postural Changes and/or Swallow Maneuvers: Seated upright 90 degrees                Oral Care Recommendations: Oral care BID Follow up Recommendations: Skilled Nursing facility SLP Visit Diagnosis: Dysphagia, oropharyngeal phase (R13.12) Plan: Continue with current plan of care       [name] I. [name], [place], [place] Office number [phone] Pager [place] 05/11/2019, 3:56 PM

## 2019-05-11 NOTE — Consult Note (Addendum)
ELECTROPHYSIOLOGY CONSULT NOTE    Patient ID: Jesse Murphy MRN: [id number], DOB/AGE: 84-11-1928 84 y.o.  Admit date: 03/13/2019 Date of Consult: 05/11/2019  Primary Physician: Leeanne Rio, MD Primary Cardiologist: Bronson Ing Heart Failure: Bensimhon TCTS: Prescott Gum Electrophysiologist: Sejla Marzano (new this admission)  Patient Profile: Jesse Murphy is a 86 y.o. male with a history of CAD, chronic diastolic heart failure, paroxysmal atrial fibrillation, HTN, hyperlipidemia, follicular lymphoma and prostate cancer who is being seen today for the evaluation of bradycardia at the request of Dr Prescott Gum.  HPI:  Jesse Murphy is a 86 y.o. male with the above medical history. He was admitted 03/13/19 with tachy palpitations and chest pain.  He was found to be in AF with RVR.  He underwent cath 03/15/19 which demonstrated obstructive CAD and he was referred to CT surgery for CABG evaluation. He underwent CABG/MAZE/LAA occlusion on 03/16/19 and has had a very prolonged post op course.  His post op course has been complicated by AKI requiring HD, respiratory failure requiring reintubation (now resolved), and bladder obstruction.  He has been receiving bladder irrigations and yesterday afternoon during irrigation had an event where his heart rate dropped into the teens and was sustained there.  He underwent temp pacemaker wire placement by Dr Prescott Gum at the bedside. This morning, he is conducting 1:1 again with rates in the 70's.  EP has been asked to evaluate for treatment options. There is concern for recurrent vagal events with need for ongoing bladder irrigations.   He currently denies chest pain, palpitations, dyspnea, PND, orthopnea, nausea, vomiting, dizziness, syncope, edema, weight gain, or early satiety.  Past Medical History:  Diagnosis Date  . Atrial fibrillation ([place])   . CAD (coronary artery disease)    a. s/p PCI in 1992 b. Coronary CT in 01/2018 showing extensive coronary  calcification; FFR indeterminate --> medical management pursued at that time as patient not interested in repeat cath  . Cancer [facility])    prostate  . Dyspnea   . Hypertension      Surgical History:  Past Surgical History:  Procedure Laterality Date  . CHEST TUBE INSERTION Left 04/23/2019   Procedure: INSERTION PLEURAL DRAINAGE CATHETER TO DRAIN LEFT PLEURAL EFFUSION;  Surgeon: Ivin Poot, MD;  Location: [place];  Service: Thoracic;  Laterality: Left;  . CLIPPING OF ATRIAL APPENDAGE N/A 03/16/2019   Procedure: CLIPPING OF LEFT  ATRIAL APPENDAGE - USING ATRICLIP SIZE 40;  Surgeon: Ivin Poot, MD;  Location: [place];  Service: Open Heart Surgery;  Laterality: N/A;  . COLONOSCOPY N/A 08/25/2017   Procedure: COLONOSCOPY;  Surgeon: Rogene Houston, MD;  [facility];  Service: Endoscopy;  Laterality: N/A;  . CORONARY ARTERY BYPASS GRAFT N/A 03/16/2019   Procedure: CORONARY ARTERY BYPASS GRAFTING (CABG), ON PUMP, TIMES THREE, USING LEFT INTERNAL MAMMARY ARTERY, RIGHT GREATER SAPHENOUS VEIN HARVESTED ENDOSCOPICALLY;  Surgeon: Ivin Poot, MD;  Location: [place];  Service: Open Heart Surgery;  Laterality: N/A;  swan only  . CYSTOSCOPY WITH FULGERATION N/A 04/30/2019   Procedure: CYSTOSCOPY WITH FULGERATION;  Surgeon: Irine Seal, MD;  Location: [place];  Service: Urology;  Laterality: N/A;  . HERNIA REPAIR    . IR FLUORO GUIDE CV LINE LEFT  04/23/2019  . IR US GUIDE VASC ACCESS LEFT  04/23/2019  . MAZE N/A 03/16/2019   Procedure: MAZE;  Surgeon: Ivin Poot, MD;  Location: [place];  Service: Open Heart Surgery;  Laterality: N/A;  .  PLACEMENT OF IMPELLA LEFT VENTRICULAR ASSIST DEVICE Right 03/27/2019   Procedure: Placement Of Impella Left Ventricular Assist Device using ABIOMED Impella 5.5 with SmartAssist Device.;  Surgeon: Wonda Olds, MD;  Location: [facility];  Service: Thoracic;  Laterality: Right;  . PROSTATE SURGERY    . REMOVAL OF IMPELLA LEFT VENTRICULAR ASSIST DEVICE N/A  04/10/2019   Procedure: REMOVAL OF IMPELLA LEFT VENTRICULAR ASSIST DEVICE WITH INSERTION OF RIGHT FEMORAL ARTERIAL LINE;  Surgeon: Ivin Poot, MD;  Location: [place];  Service: Open Heart Surgery;  Laterality: N/A;  . RIGHT/LEFT HEART CATH AND CORONARY ANGIOGRAPHY N/A 03/15/2019   Procedure: RIGHT/LEFT HEART CATH AND CORONARY ANGIOGRAPHY;  Surgeon: Burnell Blanks, MD;  Location: [place] CV LAB;  Service: Cardiovascular;  Laterality: N/A;  . TEE WITHOUT CARDIOVERSION N/A 03/16/2019   Procedure: TRANSESOPHAGEAL ECHOCARDIOGRAM (TEE);  Surgeon: Prescott Gum, Collier Salina, MD;  Location: [place];  Service: Open Heart Surgery;  Laterality: N/A;  . TRACHEOSTOMY TUBE PLACEMENT N/A 03/27/2019   Procedure: TRACHEOSTOMY placed using Shiley 8DCT Cuffed.;  Surgeon: Wonda Olds, MD;  Location: [facility];  Service: Thoracic;  Laterality: N/A;     Medications Prior to Admission  Medication Sig Dispense Refill Last Dose  . acetaminophen (TYLENOL) 325 MG tablet Take 325 mg by mouth every 8 (eight) hours as needed for moderate pain.      Marland Kitchen atorvastatin (LIPITOR) 10 MG tablet Take 10 mg by mouth.      . fluticasone (FLONASE) 50 MCG/ACT nasal spray daily as needed.      . furosemide (LASIX) 20 MG tablet Take 20 mg by mouth daily.      Marland Kitchen loratadine (CLARITIN) 10 MG tablet Take 10 mg by mouth daily.     . metoprolol tartrate (LOPRESSOR) 50 MG tablet Take 1 tablet (50 mg total) by mouth 2 (two) times daily. 180 tablet 3   . pantoprazole (PROTONIX) 40 MG tablet Take 1 tablet (40 mg total) by mouth daily. 30 tablet 0   . Pediatric Multivitamins-Iron (FLINTSTONES PLUS IRON) chewable tablet Chew 1 tablet by mouth 2 (two) times daily.     . rivaroxaban (XARELTO) 20 MG TABS tablet Take 1 tablet (20 mg total) by mouth daily with supper.   03/13/2019 at 1830  . tamsulosin (FLOMAX) 0.4 MG CAPS capsule Take by mouth.       Inpatient Medications:  . [START ON 05/14/2019] aspirin  81 mg Oral Daily  . calcium acetate (Phos  Binder)  667 mg Per Tube TID WC  . chlorhexidine  15 mL Mouth Rinse BID  . Chlorhexidine Gluconate Cloth  6 each Topical Daily  . famotidine  10 mg Per Tube Daily  . feeding supplement (PRO-STAT SUGAR FREE 64)  30 mL Per Tube BID  . Gerhardt's butt cream   Topical BID  . heparin      . heparin  4,000 Units Intravenous Once  . hydrocortisone   Rectal BID  . hydrocortisone  25 mg Rectal BID  . hydrOXYzine  10 mg Per Tube Q6H  . insulin aspart  0-15 Units Subcutaneous TID WC  . insulin aspart  0-5 Units Subcutaneous QHS  . insulin glargine  24 Units Subcutaneous Daily  . lipase/protease/amylase)  20,880 Units Per Tube Once   And  . sodium bicarbonate  650 mg Per Tube Once  . magic mouthwash  5 mL Oral TID  . mouth rinse  15 mL Mouth Rinse q12n4p  . methylPREDNISolone (SOLU-MEDROL) injection  80 mg Intravenous  Daily  . multivitamin  1 tablet Per Tube QHS  . nutrition supplement (JUVEN)  1 packet Per Tube BID BM  . simethicone  80 mg Oral QID  . sodium chloride flush  10-40 mL Intracatheter Q12H  . sodium chloride flush  10-40 mL Intracatheter Q12H    Allergies: No Known Allergies  Social History   Socioeconomic History  . Marital status: Married    Spouse name: Not on file  . Number of children: 2  . Years of education: Not on file  . Highest education level: Not on file  Occupational History  . Occupation: Librarian, academic in supply    Comment: [employer]  Tobacco Use  . Smoking status: Former Smoker    Packs/day: 0.25    Years: 50.00    Pack years: 12.50    Types: Cigarettes    Quit date: 02/15/1990    Years since quitting: 29.2  . Smokeless tobacco: Never Used  Substance and Sexual Activity  . Alcohol use: No  . Drug use: No  . Sexual activity: Not Currently  Other Topics Concern  . Not on file  Social History Narrative  . Not on file   Social Determinants of Health   Financial Resource Strain:   . Difficulty of Paying Living Expenses:   Food Insecurity:   .  Worried About Charity fundraiser in the Last Year:   . Arboriculturist in the Last Year:   Transportation Needs:   . Film/video editor (Medical):   Marland Kitchen Lack of Transportation (Non-Medical):   Physical Activity:   . Days of Exercise per Week:   . Minutes of Exercise per Session:   Stress:   . Feeling of Stress :   Social Connections:   . Frequency of Communication with Friends and Family:   . Frequency of Social Gatherings with Friends and Family:   . Attends Religious Services:   . Active Member of Clubs or Organizations:   . Attends Archivist Meetings:   Marland Kitchen Marital Status:   Intimate Partner Violence:   . Fear of Current or Ex-Partner:   . Emotionally Abused:   Marland Kitchen Physically Abused:   . Sexually Abused:      Family History  Problem Relation Age of Onset  . CAD Mother 65  . CAD Father 87  . CAD Sister   . CAD Brother      Review of Systems: All other systems reviewed and are otherwise negative except as noted above.  Physical Exam: Vitals:   05/11/19 0645 05/11/19 0700 05/11/19 0800 05/11/19 0815  BP: (!) 132/59 (!) 137/57 (!) 126/52 124/73  Pulse: 80 80 80 80  Resp: 20 (!) 23 16 19   Temp:   98.4 F (36.9 C) 98.4 F (36.9 C)  TempSrc:   Oral Oral  SpO2: 100% 99% 95% 98%  Weight:      Height:        GEN- The patient is elderly and chronically ill appearing, alert and oriented x 3 today.   HEENT: normocephalic, atraumatic; sclera clear, conjunctiva pink; hearing intact; oropharynx clear; neck supple, +LIJ with temp pacer wire Lungs- Clear to ausculation bilaterally, normal work of breathing.  No wheezes, rales, rhonchi Heart- Regular rate and rhythm  GI- soft, non-tender, non-distended, bowel sounds present Extremities- no clubbing, cyanosis, or edema  MS- no significant deformity or atrophy Skin- warm and dry, no rash or lesion Psych- euthymic mood, full affect Neuro- strength and sensation are intact  Labs:   Lab Results  Component Value  Date   WBC 12.3 (H) 05/11/2019   HGB 8.4 (L) 05/11/2019   HCT 28.3 (L) 05/11/2019   MCV 95.3 05/11/2019   PLT 214 05/11/2019    Recent Labs  Lab 05/06/19 0427 05/07/19 0416 05/11/19 0330  NA 134*   < > 136  K 5.1   < > 6.5*  CL 97*   < > 97*  CO2 27   < > 26  BUN 98*   < > 112*  CREATININE 4.96*   < > 4.22*  CALCIUM 7.9*   < > 8.6*  PROT 5.4*  --   --   BILITOT 0.5  --   --   ALKPHOS 74  --   --   ALT 7  --   --   AST 14*  --   --   GLUCOSE 112*   < > 172*   < > = values in this interval not displayed.    Radiology Studies reviewed  YJE:HUDJ 04/19/19, AF  (personally reviewed)  TELEMETRY: sinus rhythm this morning; yesterday, heart rate down to 11 with P-P and R-R slowing -> V pacing -> SR  (personally reviewed)  Assessment/Plan: 1.  Bradycardia - vagal event 2/2 bladder obstruction Rates have recovered this morning Temp pacer turned down to VVI 40 today Concern for recurrent events with bladder obstruction - ?role for suprapubic catheter placement (Dr Rayann Heman and Dr Prescott Gum discussed this morning) With prolonged hospitalization, HD, he would be at very high risk for infection with pacemaker. He currently has no indication for pacemaker placement.   2.  Acute heart failure -> cardiogenic shock Per AHF team  3.  Paroxysmal atrial fibrillation Maintaining SR S/p MAZE and LAA occlusion No anticoagulation currently with ongoing hematuria  4.  AKI Per nephrology Plan for mapping for HD access next week      For questions or updates, please contact [facility] Please consult [email or web address] for contact info under Cardiology/STEMI.  Signed, Chanetta Marshall, NP 05/11/2019 8:40 AM  I have seen, examined the patient, and reviewed the above assessment and plan.  Changes to above are made where necessary.  On exam, very ill appearing gentleman.  RRR.  The patient had a transient vagal event requiring temporary pacing.  Now has resolved.   He is no longer pacing.  I  have turned his device to VVI 40 bpm.  If no further events overnight, would plan to remove his temporary pacing wire in the am.  Electrophysiology team to see as needed over the weekend. Please call with questions.   Co Sign: Thompson Grayer, MD 05/11/2019 3:43 PM

## 2019-05-11 NOTE — Progress Notes (Signed)
CRITICAL VALUE ALERT  Critical Value:  K+ 6.5  Date & Time Notied:  05/11/19 0540  Provider Notified: Dr. Marval Regal  Orders Received/Actions taken: Milly Jakob 10mg  once, HD team notified by provider

## 2019-05-11 NOTE — Progress Notes (Signed)
PT Cancellation Note  Patient Details Name: Jesse Murphy MRN: [id number] DOB: Jul 25, 1928   Cancelled Treatment:    Reason Eval/Treat Not Completed: Medical issues which prohibited therapy  Noted pt had vagal reaction with HR 17 and subsequently had placement of transvenous temporary pacemaker. RN requested reach out to Dr. Prescott Gum about medical readiness for mobility. PT will reassess when cleared for mobility.    Earney Navy, PTA Acute Rehabilitation Services Pager: [phone] Office: [phone]   05/11/2019, 1:35 PM

## 2019-05-11 NOTE — Progress Notes (Signed)
TCTS BRIEF SICU PROGRESS NOTE  11 Days Post-Op  S/P Procedure(s) (LRB): CYSTOSCOPY WITH FULGERATION (N/A)   Stable day  Plan: Continue current plan  Rexene Alberts, MD 05/11/2019 5:41 PM

## 2019-05-11 NOTE — Progress Notes (Signed)
Patient ID: Jesse Murphy, male   DOB: 1928-08-10, 86 y.o.   MRN: [id number] f    Advanced Heart Failure Rounding Note  PCP-Cardiologist: Kate Sable, MD   Subjective:    Events - Impella removed 2/23. Intraoperative TEE showed EF ~40% - Had tunneled cath placed 3/9 and left PleurX tube.  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 Emergent TVP placed  Rhythm stable off TVP this am   K 6.5 this am. Tolerating iHD. Off midodrine.   Rash improved on steroids   Still with CBI  Objective:   Weight Range: 88.5 kg Body mass index is 29.67 kg/m.   Vital Signs:   Temp:  [97.8 F (36.6 C)-99.2 F (37.3 C)] 98.4 F (36.9 C) (03/26 0815) Pulse Rate:  [25-91] 80 (03/26 0815) Resp:  [12-27] 19 (03/26 0815) BP: (96-153)/(40-113) 124/73 (03/26 0815) SpO2:  [89 %-100 %] 98 % (03/26 0815) Weight:  [88.5 kg] 88.5 kg (03/26 0400) Last BM Date: 05/10/19  Weight change: Filed Weights   05/09/19 1900 05/10/19 0400 05/11/19 0400  Weight: 85.3 kg 83.2 kg 88.5 kg    Intake/Output:   Intake/Output Summary (Last 24 hours) at 05/11/2019 0845 Last data filed at 05/11/2019 0800 Gross per 24 hour  Intake 13835.23 ml  Output 16575 ml  Net -2739.77 ml     Physical Exam   General: Sitting up in bed  No resp difficulty HEENT: normal Neck: supple. JVP 7. Carotids 2+ bilat; no bruits. No lymphadenopathy or thryomegaly appreciated. Cor: PMI nondisplaced. Regular rate & rhythm. No rubs, gallops or murmurs. + tunneled IJ cath Lungs: clear decreased at bases Abdomen: soft, nontender, nondistended. No hepatosplenomegaly. No bruits or masses. Good bowel sounds. Extremities: no cyanosis, clubbing, rash, edema rash resolved Neuro: alert & orientedx3, cranial nerves grossly intact. moves all 4 extremities w/o difficulty.  Affect pleasant    Telemetry   Sinus 70s Personally reviewed   Labs    CBC Recent Labs    05/10/19 0400 05/10/19 0400 05/10/19 1804 05/11/19 0330  WBC 12.7*  --   --  12.3*  HGB 7.9*   < > 9.9* 8.4*  HCT 26.5*   < > 29.0* 28.3*  MCV 95.0  --   --  95.3  PLT 190  --   --  214   < > = values in this interval not displayed.   Basic Metabolic Panel Recent Labs    05/10/19 0400 05/10/19 0400 05/10/19 1804 05/11/19 0330  NA 136   < > 129* 136  K 4.6   < > 5.8* 6.5*  CL 97*  --   --  97*  CO2 29  --   --  26  GLUCOSE 51*  --   --  172*  BUN 60*  --   --  112*  CREATININE 2.91*  --   --  4.22*  CALCIUM 8.4*  --   --  8.6*  MG 2.1  --   --  2.5*  PHOS 3.5  --   --  6.3*   < > = values in this interval not displayed.   Liver Function Tests Recent Labs    05/10/19 0400 05/11/19 0330  ALBUMIN 2.7* 2.8*   No results for input(s): LIPASE, AMYLASE in the last 72 hours. Cardiac Enzymes No  results for input(s): CKTOTAL, CKMB, CKMBINDEX, TROPONINI in the last 72 hours.  BNP: BNP (last 3 results) Recent Labs    03/22/19 0401 03/26/19 0626 03/27/19 1255  BNP 340.4* 687.5* 800.4*    ProBNP (last 3 results) No results for input(s): PROBNP in the last 8760 hours.   D-Dimer No results for input(s): DDIMER in the last 72 hours. Hemoglobin A1C No results for input(s): HGBA1C in the last 72 hours. Fasting Lipid Panel No results for input(s): CHOL, HDL, LDLCALC, TRIG, CHOLHDL, LDLDIRECT in the last 72 hours. Thyroid Function Tests No results for input(s): TSH, T4TOTAL, T3FREE, THYROIDAB in the last 72 hours.  Invalid input(s): FREET3  Other results:   Imaging    DG Chest Port 1 View  Result Date: 05/11/2019 CLINICAL DATA:  CABG.  Sore chest. EXAM: PORTABLE CHEST 1 VIEW COMPARISON:  05/10/2019. FINDINGS: Feeding tube, right IJ pacing lead, left IJ dual-lumen catheter in stable position. A catheter, possibly a PICC line. Noted with tip over the right  subclavian region. Prior CABG. Stable cardiomegaly. Bilateral pulmonary infiltrates/edema slightly progressed. Stable small bilateral pleural effusions. No pneumothorax. IMPRESSION: 1. A catheter, possibly a PICC line, noted with tip over the right subclavian region. Remaining lines and tubes in stable position. 2.  Prior CABG.  Cardiomegaly. 3. Bilateral pulmonary infiltrates/edema slightly progressed from prior exam. Stable small bilateral pleural effusions. No pneumothorax. Electronically Signed   By: Marcello Moores  Register   On: 05/11/2019 06:22   DG CHEST PORT 1 VIEW  Result Date: 05/10/2019 CLINICAL DATA:  New temporary pacemaker EXAM: PORTABLE CHEST 1 VIEW COMPARISON:  Portable exam 1838 hours compared to 05/09/2019 FINDINGS: Feeding tube extends into stomach. New RIGHT jugular pacing lead with tip projecting over superior RIGHT ventricle. LEFT jugular dual-lumen central venous catheter with tip projecting over SVC. External pacing leads present. Normal heart size post CABG. Mediastinal contours and pulmonary vascularity normal. Atherosclerotic calcification aorta. Bibasilar atelectasis and question layered effusions, decreased. No pneumothorax or new infiltrate identified. IMPRESSION: Improved bibasilar aeration. Electronically Signed   By: Lavonia Dana M.D.   On: 05/10/2019 18:57     Medications:     Scheduled Medications:  [START ON 05/14/2019] aspirin  81 mg Oral Daily   calcium acetate (Phos Binder)  667 mg Per Tube TID WC   chlorhexidine  15 mL Mouth Rinse BID   Chlorhexidine Gluconate Cloth  6 each Topical Daily   famotidine  10 mg Per Tube Daily   feeding supplement (PRO-STAT SUGAR FREE 64)  30 mL Per Tube BID   Gerhardt's butt cream   Topical BID   heparin       heparin  4,000 Units Intravenous Once   hydrocortisone   Rectal BID   hydrocortisone  25 mg Rectal BID   hydrOXYzine  10 mg Per Tube Q6H   insulin aspart  0-15 Units Subcutaneous TID WC   insulin aspart  0-5  Units Subcutaneous QHS   insulin glargine  24 Units Subcutaneous Daily   lipase/protease/amylase)  20,880 Units Per Tube Once   And   sodium bicarbonate  650 mg Per Tube Once   magic mouthwash  5 mL Oral TID   mouth rinse  15 mL Mouth Rinse q12n4p   methylPREDNISolone (SOLU-MEDROL) injection  80 mg Intravenous Daily   multivitamin  1 tablet Per Tube QHS   nutrition supplement (JUVEN)  1 packet Per Tube BID BM   simethicone  80 mg Oral QID   sodium chloride flush  10-40 mL  Intracatheter Q12H   sodium chloride flush  10-40 mL Intracatheter Q12H    Infusions:  sodium chloride     sodium chloride     sodium chloride Stopped (05/08/19 1833)   epinephrine Stopped (05/11/19 0522)   feeding supplement (JEVITY 1.5 CAL/FIBER) 1,000 mL (05/10/19 2024)   ferric gluconate (FERRLECIT/NULECIT) IV 125 mg (05/09/19 1808)   fluconazole (DIFLUCAN) IV 200 mg (05/10/19 1500)    PRN Medications: sodium chloride, sodium chloride, oxyCODONE **AND** acetaminophen, dextrose, hyoscyamine, levalbuterol, lidocaine (PF), lidocaine-prilocaine, loperamide HCl, ondansetron (ZOFRAN) IV, pentafluoroprop-tetrafluoroeth, promethazine, Resource ThickenUp Clear, sodium chloride flush, sodium chloride flush, sodium chloride flush    Assessment/Plan   1. Acute systolic HF -> Cardiogenic shock - post-op echo on 03/20/19 EF 25-30% (pre-op 30-35%. In 12/2017 EF normal) - Required Impella support post CABG. Impella removed 2/23. Post-op TEE EF 40% - Has developed ESRD - Volume status slightly up. HD today  - tolerating iHD. MWF Schedule. Now off midodrine due to rash (unclear which medication caused). BP stable - unable to tolerate GDMT with low BP and ESRD  2. CAD s/p CABG this admit (LIMA->LAD, SVG->OM, SVG->RCA on 03/15/19) - No s/s ischemia - Continue statin.  - ASA off due to hematuria and BRBPR  3. Acute hypoxic respiratory failure with Pseudomonas PNA - s/p trach on 03/27/19 - completed  meropenem for pseudomonas PNA - trach aspirate 3/5 w/ regrowth of Pseudomonas again.   - Completed antibiotic course.  -Trach decannulated 3/13. Stable on [place] - No change   4. PAF - s/p MAZE and LAA occlusion - Converted back to NSR on amio. Off amio due to rash - No AC with ongoing hematuria  5. AKI -> ESRD - due to shock/ATN.  - Tunneled cath placed 3/9  - He is tolerating iHD. Midodrine now off.  - Vein mapping complete. Nephrology considering permanent access  6. Hematuria -Ongoing.  Remains on CBI. -Cystoscopy 3/15 with fulgeration - off anticoagulation. - Urology started aluminum irrigation  - transfuse as needed. hgb stable at 8.4 today. - await further urology recs  7. Complete heart block with profound symptomatic bradycardia - likely vagal in nature - s/p emergent TVP 3/25 - wean as tolerate  8. Debility, severe - continue PT/OT  9. F/E/N - Tube feeds per [employer].     10. Left Pleural Effusion  - moderate to large. - s/p PleurX on 3/8.  - TCTS managing. Draining M/Th  10. Diffuse rash - Appearance of drug induced rash. - Steroids started 3/21 -> much improved - Begin rapid taper today. Discussed dosing with PharmD personally. - Can add needed meds back 1 by 1 watching for recurrent rash  11. Anemia: - h/o melena and hematuria this admit. Now with copious BRBPR - monitor. transfuse if < 7.5 - hgb 8.4  12. Dysphagia and BRBPR - ? Possible esophagitis +/- radiation proctitis - GI following. On IV fluconazole. Will get EGD if no improvement in dysphagia  13. Hyperkalemia - K 6.5 today - HD MWF - lokelma as needed on non HD days. - Renal following  Glori Bickers, MD  8:45 AM

## 2019-05-11 NOTE — Progress Notes (Signed)
Chaplain engaged in follow-up visit with Jesse Murphy.  Jesse Murphy affirmed that is health journey has been long and hard.  He stated that he has been through so many procedures. Jesse Murphy expressed feeling happy that his minister will be able to come tomorrow. Chaplain offered support and the ministry of listening.  Jesse Murphy thanked chaplain for checking in.    Chaplain will continue to follow-up.

## 2019-05-11 NOTE — Progress Notes (Signed)
BLOOD FLOW REDUCED TO 300 D/T POOR DELIVERY OF ACCESS MD AWARE TPA TO BE INSTILLED POST [place]

## 2019-05-11 NOTE — Op Note (Signed)
NAME: Jesse Murphy, Jesse Murphy. MEDICAL RECORD GS:[id number] ACCOUNT [id number] DATE OF BIRTH:24-Feb-1928 FACILITY: [facility] LOCATION: [facility] PHYSICIAN:Leyton Brownlee VAN TRIGT III, MD  OPERATIVE REPORT  DATE OF PROCEDURE:  05/10/2019  OPERATION:  Emergency placement of transvenous temporary pacemaker lead.  PREOPERATIVE DIAGNOSIS:  Vagal reaction with heart rate of 17.  POSTOPERATIVE DIAGNOSIS:  Vagal reaction with heart rate of 17.  SURGEON:  Ivin Poot, MD  ANESTHESIA:  Local 1% lidocaine.  CLINICAL NOTE:  The patient is an 86 year old male with ischemic cardiomyopathy recovering from urgent CABG.  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  His bladder blood clots had been improving after cystoscopy, fulguration, and irrigation with alum.  He was actively followed by urology.  On the evening of surgery the continuous  bladder irrigation system dysfunctioned and his bladder over distended and he developed a severe vagal reaction with severe bradycardia, diaphoresis, but not loss of consciousness and he maintained a pulse.  He was given atropine and started on low dose  epinephrine.  The bladder catheter was irrigated of clots and some particulate matter with improved range.  His heart rate still remained at 17.  I went ahead and placed the temporary pacing wire as an emergency in his ICU bed.  There was not time for  informed consent.  DESCRIPTION OF PROCEDURE:  The right neck was quickly prepped and draped as a sterile field.  Lidocaine 1% was infiltrated in the scalene triangle.  Using the Seldinger technique, the right IJ was accessed and a pacing wire placed and passed into the  right side of the heart.  The dilator and then the sheaths were placed.  Through the sheath, the temporary pacing wire with the balloon on the catheter was  fed well while the pacemaker was engaged.  When the heart rate was paced at 80 beats per minute  the catheter was secured in the sheath and the sheath secured to the skin.  The capture of the temporary pacing wire was adequate with a threshold of less than 1 mA.  The patient's general level of consciousness improved and he became stable.  Followup  chest x-ray showed the lead in the RV outflow tract without pneumothorax.  CN/NUANCE  D:05/10/2019 T:05/11/2019 JOB:[id number]

## 2019-05-12 LAB — RENAL FUNCTION PANEL
Albumin: 2.5 g/dL — ABNORMAL LOW (ref 3.5–5.0)
Anion gap: 11 (ref 5–15)
BUN: 77 mg/dL — ABNORMAL HIGH (ref 8–23)
CO2: 27 mmol/L (ref 22–32)
Calcium: 8.1 mg/dL — ABNORMAL LOW (ref 8.9–10.3)
Chloride: 97 mmol/L — ABNORMAL LOW (ref 98–111)
Creatinine, Ser: 3.13 mg/dL — ABNORMAL HIGH (ref 0.61–1.24)
GFR calc Af Amer: 20 mL/min — ABNORMAL LOW (ref >60)
GFR calc non Af Amer: 17 mL/min — ABNORMAL LOW (ref >60)
Glucose, Bld: 161 mg/dL — ABNORMAL HIGH (ref 70–99)
Phosphorus: 7.1 mg/dL — ABNORMAL HIGH (ref 2.5–4.6)
Potassium: 5.3 mmol/L — ABNORMAL HIGH (ref 3.5–5.1)
Sodium: 135 mmol/L (ref 135–145)

## 2019-05-12 LAB — GLUCOSE, CAPILLARY
Glucose-Capillary: 174 mg/dL — ABNORMAL HIGH (ref 70–99)
Glucose-Capillary: 234 mg/dL — ABNORMAL HIGH (ref 70–99)
Glucose-Capillary: 256 mg/dL — ABNORMAL HIGH (ref 70–99)
Glucose-Capillary: 182 mg/dL — ABNORMAL HIGH (ref 70–99)

## 2019-05-12 LAB — COMPREHENSIVE METABOLIC PANEL
ALT: 16 U/L (ref 0–44)
AST: 22 U/L (ref 15–41)
Albumin: 2.6 g/dL — ABNORMAL LOW (ref 3.5–5.0)
Alkaline Phosphatase: 68 U/L (ref 38–126)
Anion gap: 15 (ref 5–15)
BUN: 118 mg/dL — ABNORMAL HIGH (ref 8–23)
CO2: 24 mmol/L (ref 22–32)
Calcium: 8.2 mg/dL — ABNORMAL LOW (ref 8.9–10.3)
Chloride: 97 mmol/L — ABNORMAL LOW (ref 98–111)
Creatinine, Ser: 4.27 mg/dL — ABNORMAL HIGH (ref 0.61–1.24)
GFR calc Af Amer: 13 mL/min — ABNORMAL LOW (ref >60)
GFR calc non Af Amer: 12 mL/min — ABNORMAL LOW (ref >60)
Glucose, Bld: 190 mg/dL — ABNORMAL HIGH (ref 70–99)
Potassium: 5.9 mmol/L — ABNORMAL HIGH (ref 3.5–5.1)
Sodium: 136 mmol/L (ref 135–145)
Total Bilirubin: 1.2 mg/dL (ref 0.3–1.2)
Total Protein: 5.1 g/dL — ABNORMAL LOW (ref 6.5–8.1)

## 2019-05-12 LAB — CBC
HCT: 25.8 % — ABNORMAL LOW (ref 39.0–52.0)
HCT: 29 % — ABNORMAL LOW (ref 39.0–52.0)
Hemoglobin: 7.8 g/dL — ABNORMAL LOW (ref 13.0–17.0)
Hemoglobin: 9 g/dL — ABNORMAL LOW (ref 13.0–17.0)
MCH: 28.5 pg (ref 26.0–34.0)
MCH: 28.4 pg (ref 26.0–34.0)
MCHC: 30.2 g/dL (ref 30.0–36.0)
MCHC: 31 g/dL (ref 30.0–36.0)
MCV: 94.2 fL (ref 80.0–100.0)
MCV: 91.5 fL (ref 80.0–100.0)
Platelets: 151 10*3/uL (ref 150–400)
Platelets: 143 10*3/uL — ABNORMAL LOW (ref 150–400)
RBC: 2.74 MIL/uL — ABNORMAL LOW (ref 4.22–5.81)
RBC: 3.17 MIL/uL — ABNORMAL LOW (ref 4.22–5.81)
RDW: 23.4 % — ABNORMAL HIGH (ref 11.5–15.5)
RDW: 22.1 % — ABNORMAL HIGH (ref 11.5–15.5)
WBC: 8.6 10*3/uL (ref 4.0–10.5)
WBC: 8 10*3/uL (ref 4.0–10.5)
nRBC: 0 % (ref 0.0–0.2)
nRBC: 0 % (ref 0.0–0.2)

## 2019-05-12 LAB — PREPARE RBC (CROSSMATCH)

## 2019-05-12 LAB — MAGNESIUM: Magnesium: 2.1 mg/dL (ref 1.7–2.4)

## 2019-05-12 MED ORDER — SODIUM CHLORIDE 0.9% IV SOLUTION: Freq: Once | INTRAVENOUS | Status: DC

## 2019-05-12 MED ORDER — STERILE WATER FOR IRRIGATION IR SOLN: 100.0000 mL | Freq: Once | Status: DC

## 2019-05-12 MED ORDER — SIMETHICONE 80 MG PO CHEW
80.0000 mg | CHEWABLE_TABLET | Freq: Three times a day (TID) | ORAL | Status: DC
  Administered 2019-05-12 – 2019-05-22 (×28): 80 mg via ORAL
  Filled 2019-05-12 (×28): qty 1

## 2019-05-12 MED ORDER — STERILE WATER FOR IRRIGATION IR SOLN
Freq: Once | Status: AC
  Filled 2019-05-12: qty 95

## 2019-05-12 MED ORDER — PATIROMER SORBITEX CALCIUM 8.4 G PO PACK
8.4000 g | PACK | Freq: Every day | ORAL | Status: DC
  Administered 2019-05-12: 8.4 g via ORAL
  Filled 2019-05-12 (×2): qty 1

## 2019-05-12 MED ORDER — CEFAZOLIN SODIUM-DEXTROSE 2-4 GM/100ML-% IV SOLN
2.0000 g | INTRAVENOUS | Status: AC
  Administered 2019-05-13: 2 g via INTRAVENOUS
  Filled 2019-05-12 (×2): qty 100

## 2019-05-12 NOTE — Progress Notes (Signed)
I reassessed the patient today.  He continues to have intermittent gross hematuria.  Sometimes he does not tolerate it well and vagals down.  At this point, he has failed cystoscopy with fulguration, alum, and carbiprost.  I discussed this with the patient as well as his son.  I think the next best step to stop the bleeding would be to proceed back to the operating room for cystoscopy with clot evacuation and bladder fulguration with a cystogram and possible instillation of formalin.  He has been on dialysis for quite some time now.  There is a low chance of renal recovery.  I discussed this with the patient.  I discussed that formalin can cause permanent dysfunction of the bladder.  However, if he does not have renal recovery, this should not have much effect on him.  I discussed the possibility of bladder dysfunction, bladder pain.  I discussed the possibility that this does not stop the bleeding.  He expressed understanding.  He wishes to proceed.

## 2019-05-12 NOTE — Progress Notes (Signed)
0600: Patient experiencing bladder distention and pain. Foley was irrigated by RN and noticeably lager size clots were noted. Continuous drainage was also noted to be leaking around the foley. After bladder irrigation patient was became become hypotensive, but otherwise asymptomatic.  Dr. Ricard Dillon and Dr. Gloriann Loan made aware.  Verbal order from Dr. Gloriann Loan to gently irrigate one more time and then clamp off CBI until he is able to come and assess at bedside.

## 2019-05-12 NOTE — Progress Notes (Addendum)
PT Cancellation Note  Patient Details Name: Jesse Murphy MRN: [id number] DOB: Apr 06, 1928   Cancelled Treatment:    Reason Eval/Treat Not Completed: Medical issues which prohibited therapy. Temporary pacer remains in place this AM. PT to resume treated after pacer removed and cleared by MD.   1221 addendum: Temporary pacer removed. Pt rolled for RN to place bedpan and became unresponsive. Several minutes passed before pt again became alert. Additional RN arrived to assist with flushing foley catheter. Blot clots were removed. Pt with Hgb of 7.8. Scheduled to receive 1 unit PRBC due to ongoing hypotension. PT to hold Rx today.   Lorriane Shire 05/12/2019, 9:50 AM  Lorrin Goodell, PT  Office # [phone] Pager [phone]

## 2019-05-12 NOTE — Progress Notes (Signed)
Urology Inpatient Progress Report  Atrial fibrillation with rapid ventricular response (HCC) [I48.91] Atrial fibrillation with RVR (HCC) [I48.91] S/P CABG x 3 [Z95.1]  Procedure(s): CYSTOSCOPY WITH FULGERATION  12 Days Post-Op   Intv/Subj: carbiprost bladder instillation was completed.  Apparently, hematuria seemed to improve but he did develop a large clot that plugged his catheter during CBI and caused bladder pain/distention.  Nursing irrigated the catheter with return of a large clot and called me.  CBI was turned off.  Upon arrival, the patient was comfortable.  I irrigated the bladder with no large clot return.  There was a moderate degree of sediment probably old blood.  This irrigated to clear.  I restarted CBI on a slow drip and the output was clear.  Principal Problem:   Atrial fibrillation with rapid ventricular response (HCC) Active Problems:   PAF (paroxysmal atrial fibrillation) (HCC)   GERD (gastroesophageal reflux disease)   Chronic anticoagulation   CAD S/P percutaneous coronary angioplasty   Type 2 diabetes mellitus without complications (HCC)   Non Hodgkin's lymphoma (HCC)   Chronic diastolic heart failure (HCC)   Hypertension   Atrial fibrillation with RVR (HCC)   Non-ST elevation (NSTEMI) myocardial infarction (HCC)   Acute on chronic combined systolic and diastolic CHF (congestive heart failure) (HCC)   Coronary artery disease due to lipid rich plaque   S/P CABG x 3   Acute respiratory failure with hypoxia (HCC)   Pressure injury of skin   Palliative care encounter   AKI (acute kidney injury) ([place])  Current Facility-Administered Medications  Medication Dose Route Frequency Provider Last Rate Last Admin  . 0.9 %  sodium chloride infusion (Manually program via Guardrails IV Fluids)   Intravenous Once Rexene Alberts, MD      . 0.9 %  sodium chloride infusion  100 mL Intravenous PRN Irine Seal, MD      . 0.9 %  sodium chloride infusion  100 mL Intravenous  PRN Irine Seal, MD      . 0.9 %  sodium chloride infusion   Intravenous Continuous Irine Seal, MD   Stopped at 05/08/19 1833  . oxyCODONE (ROXICODONE) 5 MG/5ML solution 5 mg  5 mg Per Tube Q4H PRN Ivin Poot, MD   5 mg at 05/09/19 2151   And  . acetaminophen (TYLENOL) 160 MG/5ML solution 325 mg  325 mg Per Tube Q4H PRN Ivin Poot, MD   325 mg at 05/08/19 2126  . [START ON 05/14/2019] aspirin chewable tablet 81 mg  81 mg Oral Daily Prescott Gum, Collier Salina, MD      . calcium acetate (Phos Binder) [facility]) 667 MG/5ML oral solution 667 mg  667 mg Per Tube TID WC Ivin Poot, MD   667 mg at 05/12/19 0858  . carboprost (HEMABATE) 10 mg in sodium chloride 0.9 % 1,000 mL (1 mg%) irrigation   Bladder Irrigation Q24H Marton Redwood III, MD   Given at 05/11/19 2128   And  . sodium chloride irrigation 0.9 % 3,000 mL  3,000 mL Bladder Irrigation Q24H Marton Redwood III, MD   3,000 mL at 05/12/19 0900  . chlorhexidine (PERIDEX) 0.12 % solution 15 mL  15 mL Mouth Rinse BID Irine Seal, MD   15 mL at 05/12/19 0859  . Chlorhexidine Gluconate Cloth 2 % PADS 6 each  6 each Topical Daily Ivin Poot, MD   6 each at 05/11/19 1623  . dextrose 50 % solution 0-50 mL  0-50 mL  Intravenous PRN Irine Seal, MD      . EPINEPHrine (ADRENALIN) 4 mg in NS 250 mL (0.016 mg/mL) premix infusion  0-2 mcg/min Intravenous Titrated Rexene Alberts, MD   Stopped at 05/11/19 0522  . famotidine (PEPCID) tablet 10 mg  10 mg Per Tube Daily Donnamae Jude, RPH   10 mg at 05/12/19 6720  . feeding supplement (JEVITY 1.5 CAL/FIBER) liquid 1,000 mL  1,000 mL Oral Continuous Ivin Poot, MD 55 mL/hr at 05/11/19 2357 1,000 mL at 05/11/19 2357  . feeding supplement (PRO-STAT SUGAR FREE 64) liquid 60 mL  60 mL Per Tube BID Prescott Gum, Collier Salina, MD   60 mL at 05/12/19 0859  . ferric gluconate (NULECIT) 125 mg in sodium chloride 0.9 % 100 mL IVPB  125 mg Intravenous Q M,W,F-HD Madelon Lips, MD   Stopped at 05/11/19 1439  .  Gerhardt's butt cream   Topical BID Irine Seal, MD   Given at 05/11/19 2131  . heparin injection 4,000 Units  4,000 Units Intravenous Once Justin Mend, MD      . hydrocortisone (ANUSOL-HC) 2.5 % rectal cream   Rectal BID Irine Seal, MD   Given at 05/11/19 2133  . hydrocortisone (ANUSOL-HC) suppository 25 mg  25 mg Rectal BID Prescott Gum, Collier Salina, MD   25 mg at 05/12/19 0857  . hydrOXYzine (ATARAX) 10 MG/5ML syrup 10 mg  10 mg Per Tube Q6H PRN Bensimhon, Shaune Pascal, MD      . hyoscyamine (LEVSIN SL) SL tablet 0.125 mg  0.125 mg Sublingual Q6H PRN Irine Seal, MD      . insulin aspart (novoLOG) injection 0-15 Units  0-15 Units Subcutaneous TID WC Ivin Poot, MD   3 Units at 05/12/19 0703  . insulin aspart (novoLOG) injection 0-5 Units  0-5 Units Subcutaneous QHS Ivin Poot, MD   2 Units at 05/10/19 2341  . levalbuterol (XOPENEX) nebulizer solution 0.63 mg  0.63 mg Nebulization Q6H PRN Irine Seal, MD   0.63 mg at 04/26/19 1353  . lidocaine (PF) (XYLOCAINE) 1 % injection 5 mL  5 mL Intradermal PRN Irine Seal, MD      . lidocaine-prilocaine (EMLA) cream 1 application  1 application Topical PRN Irine Seal, MD      . loperamide HCl (IMODIUM) 1 MG/7.5ML suspension 4 mg  4 mg Per Tube PRN Ivin Poot, MD      . magic mouthwash  5 mL Oral TID Prescott Gum, Collier Salina, MD   5 mL at 05/12/19 0859  . MEDLINE mouth rinse  15 mL Mouth Rinse q12n4p Irine Seal, MD   15 mL at 05/11/19 1609  . multivitamin (RENA-VIT) tablet 1 tablet  1 tablet Per Tube QHS Prescott Gum, Collier Salina, MD   1 tablet at 05/11/19 2130  . nutrition supplement (JUVEN) (JUVEN) powder packet 1 packet  1 packet Per Tube BID BM Ivin Poot, MD   1 packet at 05/12/19 [phone]  . ondansetron (ZOFRAN) injection 4 mg  4 mg Intravenous Q6H PRN Irine Seal, MD   4 mg at 05/10/19 1721  . patiromer Daryll Drown) packet 8.4 g  8.4 g Oral Daily Elmarie Shiley, MD   8.4 g at 05/12/19 1126  . pentafluoroprop-tetrafluoroeth (GEBAUERS) aerosol 1 application   1 application Topical PRN Irine Seal, MD      . Derrill Memo ON 05/15/2019] predniSONE (DELTASONE) tablet 20 mg  20 mg Per Tube Q breakfast Bensimhon, Shaune Pascal, MD  Followed by  . [START ON 05/17/2019] predniSONE (DELTASONE) tablet 10 mg  10 mg Per Tube Q breakfast Bensimhon, Shaune Pascal, MD      . Derrill Memo ON 05/13/2019] predniSONE (DELTASONE) tablet 30 mg  30 mg Per Tube Q breakfast Bensimhon, Shaune Pascal, MD      . promethazine (PHENERGAN) injection 6.25 mg  6.25 mg Intravenous Q6H PRN Irine Seal, MD   6.25 mg at 05/08/19 1011  . Resource ThickenUp Clear   Oral PRN Irine Seal, MD   Given at 05/02/19 [phone]  . simethicone (MYLICON) chewable tablet 80 mg  80 mg Oral TID Prescott Gum, Collier Salina, MD      . sodium chloride flush (NS) 0.9 % injection 10-40 mL  10-40 mL Intracatheter Q12H Irine Seal, MD   20 mL at 05/11/19 2200  . sodium chloride flush (NS) 0.9 % injection 10-40 mL  10-40 mL Intracatheter PRN Irine Seal, MD      . sodium chloride flush (NS) 0.9 % injection 10-40 mL  10-40 mL Intracatheter Q12H Prescott Gum, Collier Salina, MD   10 mL at 05/11/19 1014  . sodium chloride flush (NS) 0.9 % injection 10-40 mL  10-40 mL Intracatheter PRN Prescott Gum, Collier Salina, MD      . sodium chloride flush (NS) 0.9 % injection 3 mL  3 mL Intravenous PRN Irine Seal, MD   3 mL at 04/16/19 0954     Objective: Vital: Vitals:   05/12/19 0930 05/12/19 0945 05/12/19 1000 05/12/19 1015  BP: (!) 120/40 (!) 116/35 (!) 125/35 (!) 110/44  Pulse: 86 85 85 86  Resp: 15 13 12 14   Temp:      TempSrc:      SpO2: 95% 97% 97% 95%  Weight:      Height:       I/Os: I/O last 3 completed shifts: In: 12349.5 [I.V.:56.3; Other:10550; NG/GT:1440; IV Piggyback:303.2] Out: 21275 [DVVOH:60737; Other:3000]  Physical Exam:  General: Patient is in no apparent distress Lungs: Normal respiratory effort, chest expands symmetrically. GI: The abdomen is soft and nontender without mass. Foley: Three-way Foley catheter in place clear on a slow drip after I  irrigated Ext: lower extremities symmetric  Lab Results: Recent Labs    05/10/19 0400 05/10/19 0400 05/10/19 1804 05/11/19 0330 05/12/19 0309  WBC 12.7*  --   --  12.3* 8.6  HGB 7.9*   < > 9.9* 8.4* 7.8*  HCT 26.5*   < > 29.0* 28.3* 25.8*   < > = values in this interval not displayed.   Recent Labs    05/10/19 0400 05/10/19 0400 05/10/19 1804 05/11/19 0330 05/12/19 0309  NA 136   < > 129* 136 135  K 4.6   < > 5.8* 6.5* 5.3*  CL 97*  --   --  97* 97*  CO2 29  --   --  26 27  GLUCOSE 51*  --   --  172* 161*  BUN 60*  --   --  112* 77*  CREATININE 2.91*  --   --  4.22* 3.13*  CALCIUM 8.4*  --   --  8.6* 8.1*   < > = values in this interval not displayed.   No results for input(s): LABPT, INR in the last 72 hours. No results for input(s): LABURIN in the last 72 hours. Results for orders placed or performed during the hospital encounter of 03/13/19  Respiratory Panel by RT PCR (Flu A&B, Covid) - Nasopharyngeal Swab     Status:  None   Collection Time: 03/13/19  1:49 PM   Specimen: Nasopharyngeal Swab  Result Value Ref Range Status   SARS Coronavirus 2 by RT PCR NEGATIVE NEGATIVE Final    Comment: (NOTE) SARS-CoV-2 target nucleic acids are NOT DETECTED. The SARS-CoV-2 RNA is generally detectable in upper respiratoy specimens during the acute phase of infection. The lowest concentration of SARS-CoV-2 viral copies this assay can detect is 131 copies/mL. A negative result does not preclude SARS-Cov-2 infection and should not be used as the sole basis for treatment or other patient management decisions. A negative result may occur with  improper specimen collection/handling, submission of specimen other than nasopharyngeal swab, presence of viral mutation(s) within the areas targeted by this assay, and inadequate number of viral copies (<131 copies/mL). A negative result must be combined with clinical observations, patient history, and epidemiological information.  The expected result is Negative. Fact Sheet for Patients:  PinkCheek.be Fact Sheet for Healthcare Providers:  GravelBags.it This test is not yet ap proved or cleared by the [place] FDA and  has been authorized for detection and/or diagnosis of SARS-CoV-2 by FDA under an Emergency Use Authorization (EUA). This EUA will remain  in effect (meaning this test can be used) for the duration of the COVID-19 declaration under Section 564(b)(1) of the Act, 21 U.S.C. section 360bbb-3(b)(1), unless the authorization is terminated or revoked sooner.    Influenza A by PCR NEGATIVE NEGATIVE Final   Influenza B by PCR NEGATIVE NEGATIVE Final    Comment: (NOTE) The Xpert Xpress SARS-CoV-2/FLU/RSV assay is intended as an aid in  the diagnosis of influenza from Nasopharyngeal swab specimens and  should not be used as a sole basis for treatment. Nasal washings and  aspirates are unacceptable for Xpert Xpress SARS-CoV-2/FLU/RSV  testing. Fact Sheet for Patients: PinkCheek.be Fact Sheet for Healthcare Providers: GravelBags.it This test is not yet approved or cleared by the [place] FDA and  has been authorized for detection and/or diagnosis of SARS-CoV-2 by  FDA under an Emergency Use Authorization (EUA). This EUA will remain  in effect (meaning this test can be used) for the duration of the  Covid-19 declaration under Section 564(b)(1) of the Act, 21  U.S.C. section 360bbb-3(b)(1), unless the authorization is  terminated or revoked. Performed at [facility], [address], [place], [place] [address]   MRSA PCR Screening     Status: None   Collection Time: 03/13/19  5:18 PM   Specimen: Nasal Mucosa; Nasopharyngeal  Result Value Ref Range Status   MRSA by PCR NEGATIVE NEGATIVE Final    Comment:        The GeneXpert MRSA Assay (FDA approved for NASAL specimens only), is one  component of a comprehensive MRSA colonization surveillance program. It is not intended to diagnose MRSA infection nor to guide or monitor treatment for MRSA infections. Performed at [facility], [address] [place], [place] [address]   Surgical pcr screen     Status: None   Collection Time: 03/15/19  9:28 PM   Specimen: Nasal Mucosa; Nasal Swab  Result Value Ref Range Status   MRSA, PCR NEGATIVE NEGATIVE Final   Staphylococcus aureus NEGATIVE NEGATIVE Final    Comment: (NOTE) The Xpert SA Assay (FDA approved for NASAL specimens in patients 39 years of age and older), is one component of a comprehensive surveillance program. It is not intended to diagnose infection nor to guide or monitor treatment. Performed at [facility], [place],  [place] [address]   Culture, respiratory (non-expectorated)     Status: None   Collection Time: 03/19/19  3:00 PM   Specimen: Tracheal Aspirate; Respiratory  Result Value Ref Range Status   Specimen Description TRACHEAL ASPIRATE  Final   Special Requests Normal  Final   Gram Stain   Final    ABUNDANT WBC PRESENT,BOTH PMN AND MONONUCLEAR FEW GRAM POSITIVE COCCI RARE GRAM NEGATIVE RODS    Culture   Final    FEW Consistent with normal respiratory flora. Performed at [facility], [place] [address] [place], [place] [address]    Report Status 03/21/2019 FINAL  Final  Culture, respiratory (non-expectorated)     Status: None   Collection Time: 03/26/19  6:29 AM   Specimen: Tracheal Aspirate; Respiratory  Result Value Ref Range Status   Specimen Description TRACHEAL ASPIRATE  Final   Special Requests NONE  Final   Gram Stain   Final    ABUNDANT WBC PRESENT,BOTH PMN AND MONONUCLEAR NO ORGANISMS SEEN Performed at [facility], [address], [place], [place] [address]    Culture FEW PSEUDOMONAS AERUGINOSA  Final   Report Status 03/28/2019 FINAL  Final   Organism ID, Bacteria PSEUDOMONAS AERUGINOSA  Final       Susceptibility   Pseudomonas aeruginosa - MIC*    CEFTAZIDIME 8 SENSITIVE Sensitive     CIPROFLOXACIN <=0.25 SENSITIVE Sensitive     GENTAMICIN 4 SENSITIVE Sensitive     IMIPENEM 2 SENSITIVE Sensitive     * FEW PSEUDOMONAS AERUGINOSA  Culture, respiratory (non-expectorated)     Status: None   Collection Time: 04/03/19 11:08 AM   Specimen: Tracheal Aspirate; Respiratory  Result Value Ref Range Status   Specimen Description TRACHEAL ASPIRATE  Final   Special Requests NONE  Final   Gram Stain   Final    FEW WBC PRESENT, PREDOMINANTLY PMN FEW GRAM NEGATIVE RODS RARE YEAST    Culture   Final    FEW PSEUDOMONAS AERUGINOSA FEW CANDIDA TROPICALIS    Report Status 04/06/2019 FINAL  Final   Organism ID, Bacteria PSEUDOMONAS AERUGINOSA  Final      Susceptibility   Pseudomonas aeruginosa - MIC*    CEFTAZIDIME 32 RESISTANT Resistant     CIPROFLOXACIN <=0.25 SENSITIVE Sensitive     GENTAMICIN 4 SENSITIVE Sensitive     IMIPENEM Value in next row Sensitive      2 SENSITIVEPerformed at [facility], [address] [place], [place] [address]    * FEW PSEUDOMONAS AERUGINOSA  Culture, blood (Routine X 2) w Reflex to ID Panel     Status: None   Collection Time: 04/03/19 12:30 PM   Specimen: BLOOD LEFT HAND  Result Value Ref Range Status   Specimen Description BLOOD LEFT HAND  Final   Special Requests   Final    BOTTLES DRAWN AEROBIC ONLY Blood Culture results may not be optimal due to an inadequate volume of blood received in culture bottles   Culture   Final    NO GROWTH 5 DAYS Performed at [facility], [place] [address], [place], [place] [address]    Report Status 04/08/2019 FINAL  Final  Culture, blood (Routine X 2) w Reflex to ID Panel     Status: None   Collection Time: 04/03/19 12:35 PM   Specimen: BLOOD LEFT HAND  Result Value Ref Range Status   Specimen Description BLOOD LEFT HAND  Final   Special Requests   Final  BOTTLES DRAWN AEROBIC ONLY Blood Culture results  may not be optimal due to an inadequate volume of blood received in culture bottles   Culture   Final    NO GROWTH 5 DAYS Performed at [facility], [place] [address], [place], [place] [address]    Report Status 04/08/2019 FINAL  Final  Culture, respiratory (non-expectorated)     Status: None   Collection Time: 04/07/19 11:10 AM   Specimen: Tracheal Aspirate; Respiratory  Result Value Ref Range Status   Specimen Description TRACHEAL ASPIRATE  Final   Special Requests NONE  Final   Gram Stain   Final    MODERATE WBC PRESENT, PREDOMINANTLY PMN ABUNDANT GRAM NEGATIVE RODS Performed at [facility], [place] [address], [place], [place] [address]    Culture ABUNDANT PSEUDOMONAS AERUGINOSA  Final   Report Status 04/09/2019 FINAL  Final   Organism ID, Bacteria PSEUDOMONAS AERUGINOSA  Final      Susceptibility   Pseudomonas aeruginosa - MIC*    CEFTAZIDIME 16 INTERMEDIATE Intermediate     CIPROFLOXACIN <=0.25 SENSITIVE Sensitive     GENTAMICIN 4 SENSITIVE Sensitive     IMIPENEM 1 SENSITIVE Sensitive     * ABUNDANT PSEUDOMONAS AERUGINOSA  Culture, blood (routine x 2)     Status: None   Collection Time: 04/07/19  2:07 PM   Specimen: BLOOD LEFT HAND  Result Value Ref Range Status   Specimen Description BLOOD LEFT HAND  Final   Special Requests   Final    BOTTLES DRAWN AEROBIC ONLY Blood Culture adequate volume Performed at [facility], [place] [address] [place], [place] [address]    Culture NO GROWTH 5 DAYS  Final   Report Status 04/12/2019 FINAL  Final  Culture, blood (routine x 2)     Status: None   Collection Time: 04/07/19  2:07 PM   Specimen: BLOOD LEFT HAND  Result Value Ref Range Status   Specimen Description BLOOD LEFT HAND  Final   Special Requests   Final    BOTTLES DRAWN AEROBIC ONLY Blood Culture adequate volume Performed at [facility], [place] [address] [place], [place] [address]    Culture NO GROWTH 5 DAYS  Final   Report Status 04/12/2019 FINAL   Final  Culture, Urine     Status: None   Collection Time: 04/13/19  6:58 PM   Specimen: Urine, Catheterized  Result Value Ref Range Status   Specimen Description URINE, CATHETERIZED  Final   Special Requests NONE  Final   Culture   Final    NO GROWTH Performed at [facility], [address], [place], [place] [address]    Report Status 04/14/2019 FINAL  Final  MRSA PCR Screening     Status: None   Collection Time: 04/15/19 12:53 PM   Specimen: Nasal Mucosa; Nasopharyngeal  Result Value Ref Range Status   MRSA by PCR NEGATIVE NEGATIVE Final    Comment:        The GeneXpert MRSA Assay (FDA approved for NASAL specimens only), is one component of a comprehensive MRSA colonization surveillance program. It is not intended to diagnose MRSA infection nor to guide or monitor treatment for MRSA infections. Performed at [facility], [place] [address], [place], [place] [address]   Culture, respiratory (non-expectorated)     Status: None   Collection Time: 04/20/19 11:35 AM   Specimen: Tracheal Aspirate; Respiratory  Result Value Ref Range Status   Specimen Description  TRACHEAL ASPIRATE  Final   Special Requests Normal  Final   Gram Stain   Final    RARE WBC PRESENT, PREDOMINANTLY PMN FEW GRAM NEGATIVE RODS FEW GRAM POSITIVE RODS Performed at [facility], [place] [address], [place], [place] [address]    Culture MODERATE PSEUDOMONAS AERUGINOSA  Final   Report Status 04/23/2019 FINAL  Final   Organism ID, Bacteria PSEUDOMONAS AERUGINOSA  Final      Susceptibility   Pseudomonas aeruginosa - MIC*    CEFTAZIDIME >=64 RESISTANT Resistant     CIPROFLOXACIN <=0.25 SENSITIVE Sensitive     GENTAMICIN 4 SENSITIVE Sensitive     IMIPENEM >=16 RESISTANT Resistant     * MODERATE PSEUDOMONAS AERUGINOSA  Body fluid culture     Status: None   Collection Time: 04/23/19 12:02 PM   Specimen: Pleural, Left; Body Fluid  Result Value Ref Range Status   Specimen Description PLEURAL  LEFT  Final   Special Requests NONE  Final   Gram Stain   Final    ABUNDANT WBC PRESENT,BOTH PMN AND MONONUCLEAR NO ORGANISMS SEEN    Culture   Final    NO GROWTH 3 DAYS Performed at [facility], [address] [place], [place] [address]    Report Status 04/26/2019 FINAL  Final    Studies/Results: DG Chest Port 1 View  Result Date: 05/11/2019 CLINICAL DATA:  CABG.  Sore chest. EXAM: PORTABLE CHEST 1 VIEW COMPARISON:  05/10/2019. FINDINGS: Feeding tube, right IJ pacing lead, left IJ dual-lumen catheter in stable position. A catheter, possibly a PICC line. Noted with tip over the right subclavian region. Prior CABG. Stable cardiomegaly. Bilateral pulmonary infiltrates/edema slightly progressed. Stable small bilateral pleural effusions. No pneumothorax. IMPRESSION: 1. A catheter, possibly a PICC line, noted with tip over the right subclavian region. Remaining lines and tubes in stable position. 2.  Prior CABG.  Cardiomegaly. 3. Bilateral pulmonary infiltrates/edema slightly progressed from prior exam. Stable small bilateral pleural effusions. No pneumothorax. Electronically Signed   By: Marcello Moores  Register   On: 05/11/2019 06:22   DG CHEST PORT 1 VIEW  Result Date: 05/10/2019 CLINICAL DATA:  New temporary pacemaker EXAM: PORTABLE CHEST 1 VIEW COMPARISON:  Portable exam 1838 hours compared to 05/09/2019 FINDINGS: Feeding tube extends into stomach. New RIGHT jugular pacing lead with tip projecting over superior RIGHT ventricle. LEFT jugular dual-lumen central venous catheter with tip projecting over SVC. External pacing leads present. Normal heart size post CABG. Mediastinal contours and pulmonary vascularity normal. Atherosclerotic calcification aorta. Bibasilar atelectasis and question layered effusions, decreased. No pneumothorax or new infiltrate identified. IMPRESSION: Improved bibasilar aeration. Electronically Signed   By: Lavonia Dana M.D.   On: 05/10/2019 18:57     Assessment: Radiation cystitis with gross hematuria  Procedure(s): CYSTOSCOPY WITH FULGERATION, 12 Days Post-Op  doing well.  Plan: Patient is status post cystoscopy with bladder fulguration.  He had recurrent hematuria and had alum installation.  He failed this as evidenced by a recurrence of hematuria.  Current renal function is prohibitive for additional alum as it can cause systemic absorption and aluminum toxicity with failure to clear the aluminum.  Therefore, he is status post carbiprost.  Urine is now pretty clear on a slow drip CBI.  I will touch base with the primary team.  If urine remains clear, he may just need to have his catheter removed to remove that irritation to the bladder.  He may not be making much urine given his poor renal function  Link Snuffer, MD Urology 05/12/2019, 12:36 PM

## 2019-05-12 NOTE — Progress Notes (Signed)
Patient ID: Jesse Murphy, male   DOB: 1928/10/18, 86 y.o.   MRN: [id number]    Advanced Heart Failure Rounding Note  PCP-Cardiologist: Kate Sable, MD   Subjective:    Events - Impella removed 2/23. Intraoperative TEE showed EF ~40% - Had tunneled cath placed 3/9 and left PleurX tube.  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 Emergent TVP placed  No further brady events. TVP out  Continues to struggle with hematuria and clots. Says he was up all night with foley irrigation. Remains on CBI   Toelarted iHD yesterday without incident. Remains in NSR Rash resolved.   Objective:   Weight Range: 87.3 kg Body mass index is 29.26 kg/m.   Vital Signs:   Temp:  [98 F (36.7 C)-98.5 F (36.9 C)] 98.5 F (36.9 C) (03/27 0705) Pulse Rate:  [69-86] 86 (03/27 1015) Resp:  [10-21] 14 (03/27 1015) BP: (94-144)/(31-109) 110/44 (03/27 1015) SpO2:  [91 %-100 %] 95 % (03/27 1015) Weight:  [87.3 kg] 87.3 kg (03/26 1215) Last BM Date: 05/10/19  Weight change: Filed Weights   05/11/19 0400 05/11/19 0815 05/11/19 1215  Weight: 88.5 kg 90.3 kg 87.3 kg    Intake/Output:   Intake/Output Summary (Last 24 hours) at 05/12/2019 1117 Last data filed at 05/12/2019 0930 Gross per 24 hour  Intake 8783.24 ml  Output 13575 ml  Net -4791.76 ml     Physical Exam   General: Sitting up in bed  No resp difficulty HEENT: normal Neck: supple.RIJ introducer Carotids 2+ bilat; no bruits. No lymphadenopathy or thryomegaly appreciated. Cor: PMI nondisplaced. Regular rate & rhythm. No rubs, gallops or murmurs. + LIJ tunneled cath Lungs: clear Abdomen: soft, nontender, nondistended. No hepatosplenomegaly. No bruits or masses. Good bowel sounds. Extremities: no cyanosis, clubbing, rash, edema Neuro: alert & orientedx3,  cranial nerves grossly intact. moves all 4 extremities w/o difficulty. Affect pleasant + Foley with blood tinged urine and CBI     Telemetry   Sinus 70-80s Personally reviewed   Labs    CBC Recent Labs    05/11/19 0330 05/12/19 0309  WBC 12.3* 8.6  HGB 8.4* 7.8*  HCT 28.3* 25.8*  MCV 95.3 94.2  PLT 214 051   Basic Metabolic Panel Recent Labs    05/11/19 0330 05/12/19 0309  NA 136 135  K 6.5* 5.3*  CL 97* 97*  CO2 26 27  GLUCOSE 172* 161*  BUN 112* 77*  CREATININE 4.22* 3.13*  CALCIUM 8.6* 8.1*  MG 2.5* 2.1  PHOS 6.3* 7.1*   Liver Function Tests Recent Labs    05/11/19 0330 05/12/19 0309  ALBUMIN 2.8* 2.5*   No results for input(s): LIPASE, AMYLASE in the last 72 hours. Cardiac Enzymes No results for input(s): CKTOTAL, CKMB, CKMBINDEX, TROPONINI in the last 72 hours.  BNP: BNP (last 3 results) Recent Labs    03/22/19 0401 03/26/19 0626 03/27/19 1255  BNP 340.4* 687.5* 800.4*    ProBNP (last 3 results) No results for input(s): PROBNP in the last 8760 hours.   D-Dimer No results for input(s): DDIMER in the last 72 hours. Hemoglobin A1C No results for input(s): HGBA1C in the last 72 hours. Fasting Lipid Panel No results for input(s): CHOL, HDL, LDLCALC, TRIG, CHOLHDL, LDLDIRECT in the last 72 hours. Thyroid Function Tests No results for input(s):  TSH, T4TOTAL, T3FREE, THYROIDAB in the last 72 hours.  Invalid input(s): FREET3  Other results:   Imaging    No results found.   Medications:     Scheduled Medications: . sodium chloride   Intravenous Once  . [START ON 05/14/2019] aspirin  81 mg Oral Daily  . calcium acetate (Phos Binder)  667 mg Per Tube TID WC  . carboprost (HEMABATE) bladder irrigation - continuous   Bladder Irrigation Q24H   And  . sodium chloride irrigation  3,000 mL Bladder Irrigation Q24H  . chlorhexidine  15 mL Mouth Rinse BID  . Chlorhexidine Gluconate Cloth  6 each Topical Daily  . famotidine  10 mg Per  Tube Daily  . feeding supplement (PRO-STAT SUGAR FREE 64)  60 mL Per Tube BID  . Gerhardt's butt cream   Topical BID  . heparin  4,000 Units Intravenous Once  . hydrocortisone   Rectal BID  . hydrocortisone  25 mg Rectal BID  . insulin aspart  0-15 Units Subcutaneous TID WC  . insulin aspart  0-5 Units Subcutaneous QHS  . lipase/protease/amylase)  20,880 Units Per Tube Once   And  . sodium bicarbonate  650 mg Per Tube Once  . magic mouthwash  5 mL Oral TID  . mouth rinse  15 mL Mouth Rinse q12n4p  . multivitamin  1 tablet Per Tube QHS  . nutrition supplement (JUVEN)  1 packet Per Tube BID BM  . patiromer  8.4 g Oral Daily  . [START ON 05/15/2019] predniSONE  20 mg Per Tube Q breakfast   Followed by  . [START ON 05/17/2019] predniSONE  10 mg Per Tube Q breakfast  . [START ON 05/13/2019] predniSONE  30 mg Per Tube Q breakfast  . simethicone  80 mg Oral QID  . sodium chloride flush  10-40 mL Intracatheter Q12H  . sodium chloride flush  10-40 mL Intracatheter Q12H    Infusions: . sodium chloride    . sodium chloride    . sodium chloride Stopped (05/08/19 1833)  . epinephrine Stopped (05/11/19 0522)  . feeding supplement (JEVITY 1.5 CAL/FIBER) 1,000 mL (05/11/19 2357)  . ferric gluconate (FERRLECIT/NULECIT) IV Stopped (05/11/19 1439)    PRN Medications: sodium chloride, sodium chloride, oxyCODONE **AND** acetaminophen, dextrose, hydrOXYzine, hyoscyamine, levalbuterol, lidocaine (PF), lidocaine-prilocaine, loperamide HCl, ondansetron (ZOFRAN) IV, pentafluoroprop-tetrafluoroeth, promethazine, Resource ThickenUp Clear, sodium chloride flush, sodium chloride flush, sodium chloride flush    Assessment/Plan   1. Acute systolic HF -> Cardiogenic shock - post-op echo on 03/20/19 EF 25-30% (pre-op 30-35%. In 12/2017 EF normal) - Required Impella support post CABG. Impella removed 2/23. Post-op TEE EF 40% - Has developed ESRD - Volume status well maintained with iHD on MWF Schedule. Now off  midodrine due to rash (unclear which medication caused). BP stable - unable to tolerate GDMT with low BP and ESRD  2. CAD s/p CABG this admit (LIMA->LAD, SVG->OM, SVG->RCA on 03/15/19) - No s/s ischemia - Continue statin.  - ASA off due to hematuria and BRBPR  3. Acute hypoxic respiratory failure with Pseudomonas PNA - s/p trach on 03/27/19 - completed meropenem for pseudomonas PNA - trach aspirate 3/5 w/ regrowth of Pseudomonas again.   - Completed antibiotic course.  -Trach decannulated 3/13. Stable on Cherry - No change   4. PAF - s/p MAZE and LAA occlusion - Converted back to NSR on amio. Off amio due to rash - No AC with ongoing hematuria  5. AKI -> ESRD - due to shock/ATN.  -  Tunneled cath placed 3/9  - He is tolerating iHD. Midodrine now off.  - Vein mapping complete. Nephrology considering permanent access  6. Hematuria - Remains a major issue for him.  Remains on CBI with frequent need for irrigation due to clots -Cystoscopy 3/15 with fulgeration - off anticoagulation. - Urology started aluminum irrigation and trial of carbiprost - transfuse as needed. hgb 7.8 today. - Appreciate Urology input  7. Complete heart block with profound symptomatic bradycardia - likely vagal in nature - s/p emergent TVP 3/25 - resolved TVP out.   8. Debility, severe - continue PT/OT  9. F/E/N - Tube feeds per Cor-trak.     10. Left Pleural Effusion  - moderate to large. - s/p PleurX on 3/8.  - TCTS managing. Draining M/Th  10. Diffuse rash - Appearance of drug induced rash. - Steroids started 3/21 -> much improved - Now tapering steroids. Discussed dosing with PharmD personally. - Can add needed meds back 1 by 1 watching for recurrent rash  11. Anemia: - h/o melena and hematuria this admit. Now with copious BRBPR - monitor. transfuse if < 7.5 - hgb 7.8  12. Dysphagia and BRBPR - ? Possible esophagitis +/- radiation proctitis - GI following. On IV fluconazole. Improved -  Will get EGD if dysphagia recurs  13. Hyperkalemia - K 5.3 today - HD MWF - lokelma as needed on non HD days. - Renal following  Glori Bickers, MD  11:17 AM

## 2019-05-12 NOTE — Progress Notes (Signed)
      [address]       [place] [address]             [phone]        CARDIOTHORACIC SURGERY PROGRESS NOTE   R12 Days Post-Op Procedure(s) (LRB): CYSTOSCOPY WITH FULGERATION (N/A)  Subjective: Feels well today although had another episode discomfort related to distended bladder early this morning - none since foley irrigated by Urology team.  Back on low dose Epi drip due to marginal BP but maintaining NSR w/out any episodes of bradycardia  Objective: Vital signs: BP Readings from Last 1 Encounters:  05/12/19 (!) 110/44   Pulse Readings from Last 1 Encounters:  05/12/19 86   Resp Readings from Last 1 Encounters:  05/12/19 14   Temp Readings from Last 1 Encounters:  05/12/19 98.5 F (36.9 C) (Oral)    Hemodynamics:    Physical Exam:  Rhythm:   sinus  Breath sounds: clear  Heart sounds:  RRR  Incisions:  Clean and dry  Abdomen:  Soft, non-distended, non-tender  Extremities:  Warm, well-perfused    Intake/Output from previous day: 03/26 0701 - 03/27 0700 In: 2694.8 [I.V.:20; NG/GT:890; IV Piggyback:303.2] Out: 12775 [Urine:9775] Intake/Output this shift: Total I/O In: 3080 [Other:3000; NG/GT:80] Out: 2200 [Urine:2200]  Lab Results:  CBC: Recent Labs    05/11/19 0330 05/12/19 0309  WBC 12.3* 8.6  HGB 8.4* 7.8*  HCT 28.3* 25.8*  PLT 214 151    BMET:  Recent Labs    05/11/19 0330 05/12/19 0309  NA 136 135  K 6.5* 5.3*  CL 97* 97*  CO2 26 27  GLUCOSE 172* 161*  BUN 112* 77*  CREATININE 4.22* 3.13*  CALCIUM 8.6* 8.1*     PT/INR:  No results for input(s): LABPROT, INR in the last 72 hours.  CBG (last 3)  Recent Labs    05/11/19 1602 05/11/19 2335 05/12/19 0642  GLUCAP 132* 153* 174*    ABG    Component Value Date/Time   PHART 7.369 05/10/2019 1804   PCO2ART 34.6 05/10/2019 1804   PO2ART 128.0 (H) 05/10/2019 1804   HCO3 20.0 05/10/2019 1804   TCO2 21 (L) 05/10/2019 1804   ACIDBASEDEF 5.0 (H) 05/10/2019  1804   O2SAT 99.0 05/10/2019 1804    CXR: n/a  Assessment/Plan: S/P Procedure(s) (LRB): CYSTOSCOPY WITH FULGERATION (N/A)  Maintaining NSR w/out any further episodes bradycardia - discussed w/ Dr Rayann Heman - will d/c temporary pacing wire Expected acute blood loss anemia likely exacerbated by hematuria - will transfuse 1 unit PRBCs due to low BP Wean Epi off as tolerated Mobilize, resume PT   Rexene Alberts, MD 05/12/2019 10:25 AM

## 2019-05-12 NOTE — Progress Notes (Signed)
Pt experienced another vagal reaction when attempting to turn to place on bedpan.   See previous progress note for further information.

## 2019-05-12 NOTE — Progress Notes (Addendum)
TCTS BRIEF SICU PROGRESS NOTE  12 Days Post-Op  S/P Procedure(s) (LRB): CYSTOSCOPY WITH FULGERATION (N/A)   Over the course of the day patient has remained in NSR w/ exception of 2 more episodes of vague suprapubic discomfort followed by vagal episodes w/ profound bradycardia related to continued gross hematuria w/ intermittent clogging of Foley catheter Currently patient remains in NSR and feels okay  Plan: To OR for repeat cystoscopy per Dr. Gloriann Loan from Urology team  Rexene Alberts, MD 05/12/2019 6:30 PM

## 2019-05-12 NOTE — Progress Notes (Signed)
Pt experienced vagal reaction when turning to place on bedpan. HR down in teens, pt obtunded. Quickly HR increased to 80's-90's, pt now responsive and communicating needs. BP maintained throughout episode. Pt states his bladder feels "full". Foley manually irrigated, large clots flushed through foley. CBI at fast speed, flow now clear. TCTS MD made aware. Will continue to monitor.

## 2019-05-12 NOTE — Progress Notes (Signed)
Telemetry reviewed and patient discussed at bedside with Dr Roxy Manns. No further brady events.  Dr Roxy Manns has already removed the temporary pacing wire.    I think the most important intervention for the bradycardia is to maintain appropriate urinary elimination to avoid bladder distention.  Urology is on board.  Electrophysiology team to see as needed while here. Please call with questions.

## 2019-05-12 NOTE — Progress Notes (Signed)
Spoke with Dr. Gloriann Loan and confirmed that the bladder irrigation he wants [facility] to provide for this patient's OR case on Sunday 3/28 is Formalin 5% (which is equivalent to formaldehyde 1.85%).   [facility] will prepare 3/28 AM and have this couriered to [facility].  Case is currently planned for 11:35 am 3/28.  Clayburn Pert, PharmD, BCPS 05/12/2019  9:15 PM

## 2019-05-12 NOTE — Progress Notes (Signed)
Patient ID: Jesse Murphy, male   DOB: 11/14/1928, 86 y.o.   MRN: [id number]  [facility] Progress Note   Assessment/ Plan:   1. Acute kidney Injury: Appears hemodynamically mediated with ATN following postoperative congestive heart failure/cardiogenic shock.  On renal replacement therapy since 2/11 and appears to be tolerating intermittent hemodialysis without problems.  Labs without evidence of renal recovery and we will continue hemodialysis on a Monday/Wednesday/Friday schedule.  I will begin him on scheduled V[place] daily and recommend that RD switch his tube feeds to Nepro to reduce potassium load. 2.  Acute systolic heart failure/cardiogenic shock: Status post Impella support.  He continues to tolerate hemodialysis without midodrine. 3.  Coronary artery disease status post three-vessel CABG 4.  Anemia: Secondary to acute/critical illness and acute blood loss with ongoing hematuria-off Aranesp for suspicion that this was inducing drug rash; will switch to Retacrit. 5.  Nutrition: Low albumin likely negative acute phase reactant, monitor with ongoing diet/ONS. 6.  Secondary hyperparathyroidism: Calcium and phosphorus levels currently at goal, monitor with HD. 7.  Gross hematuria secondary to radiation cystitis: Underwent cystoscopy with fulguration earlier by urology with temporary cessation of CBI due to technical difficulties this morning.  Awaiting reevaluation.  Subjective:   Problems with CBI noted overnight-awaiting reevaluation by urology this morning.   Objective:   BP (!) 116/37   Pulse 81   Temp 98 F (36.7 C) (Oral)   Resp 13   Ht 5\' 8"  (1.727 m)   Wt 87.3 kg Comment: post HD wt  SpO2 95%   BMI 29.26 kg/m   Intake/Output Summary (Last 24 hours) at 05/12/2019 0757 Last data filed at 05/12/2019 5361 Gross per 24 hour  Intake 5763.24 ml  Output 12775 ml  Net -7011.76 ml   Weight change: 1.8 kg  Physical Exam: Gen: Comfortably sleeping in bed, easy to  awaken/engage in conversation CVS: Pulse regular rhythm, normal rate, S1 and S2 normal Resp: Anteriorly clear to auscultation, no rales.  Left IJ TDC Abd: Soft, obese, nontender Ext: Trace dependent edema.  Imaging: DG Chest Port 1 View  Result Date: 05/11/2019 CLINICAL DATA:  CABG.  Sore chest. EXAM: PORTABLE CHEST 1 VIEW COMPARISON:  05/10/2019. FINDINGS: Feeding tube, right IJ pacing lead, left IJ dual-lumen catheter in stable position. A catheter, possibly a PICC line. Noted with tip over the right subclavian region. Prior CABG. Stable cardiomegaly. Bilateral pulmonary infiltrates/edema slightly progressed. Stable small bilateral pleural effusions. No pneumothorax. IMPRESSION: 1. A catheter, possibly a PICC line, noted with tip over the right subclavian region. Remaining lines and tubes in stable position. 2.  Prior CABG.  Cardiomegaly. 3. Bilateral pulmonary infiltrates/edema slightly progressed from prior exam. Stable small bilateral pleural effusions. No pneumothorax. Electronically Signed   By: Marcello Moores  Register   On: 05/11/2019 06:22   DG CHEST PORT 1 VIEW  Result Date: 05/10/2019 CLINICAL DATA:  New temporary pacemaker EXAM: PORTABLE CHEST 1 VIEW COMPARISON:  Portable exam 1838 hours compared to 05/09/2019 FINDINGS: Feeding tube extends into stomach. New RIGHT jugular pacing lead with tip projecting over superior RIGHT ventricle. LEFT jugular dual-lumen central venous catheter with tip projecting over SVC. External pacing leads present. Normal heart size post CABG. Mediastinal contours and pulmonary vascularity normal. Atherosclerotic calcification aorta. Bibasilar atelectasis and question layered effusions, decreased. No pneumothorax or new infiltrate identified. IMPRESSION: Improved bibasilar aeration. Electronically Signed   By: Lavonia Dana M.D.   On: 05/10/2019 18:57    Labs: [employer] Recent Labs  Lab 05/06/19  1610 05/06/19 0427 05/07/19 0416 05/07/19 0416 05/08/19 0248  05/09/19 0340 05/09/19 1602 05/10/19 0400 05/10/19 1804 05/11/19 0330 05/12/19 0309  NA 134*   < > 133*   < > 135 131* 138 136 129* 136 135  K 5.1   < > 6.3*   < > 5.4* 6.4* 3.7 4.6 5.8* 6.5* 5.3*  CL 97*   < > 94*  --  98 94* 97* 97*  --  97* 97*  CO2 27   < > 27  --  27 26 31 29   --  26 27  GLUCOSE 112*   < > 188*  --  166* 194* 146* 51*  --  172* 161*  BUN 98*   < > 135*  --  95* 143* 82* 60*  --  112* 77*  CREATININE 4.96*   < > 5.87*  --  4.14* 5.36* 3.22* 2.91*  --  4.22* 3.13*  CALCIUM 7.9*   < > 8.5*  --  8.2* 8.5* 8.5* 8.4*  --  8.6* 8.1*  PHOS 3.5  --  3.0  --  3.2 3.6  --  3.5  --  6.3* 7.1*   < > = values in this interval not displayed.   CBC Recent Labs  Lab 05/09/19 0340 05/09/19 0340 05/10/19 0400 05/10/19 1804 05/11/19 0330 05/12/19 0309  WBC 14.0*  --  12.7*  --  12.3* 8.6  HGB 8.7*   < > 7.9* 9.9* 8.4* 7.8*  HCT 28.9*   < > 26.5* 29.0* 28.3* 25.8*  MCV 94.1  --  95.0  --  95.3 94.2  PLT 244  --  190  --  214 151   < > = values in this interval not displayed.    Medications:    . [START ON 05/14/2019] aspirin  81 mg Oral Daily  . calcium acetate (Phos Binder)  667 mg Per Tube TID WC  . carboprost (HEMABATE) bladder irrigation - continuous   Bladder Irrigation Q24H   And  . sodium chloride irrigation  3,000 mL Bladder Irrigation Q24H  . chlorhexidine  15 mL Mouth Rinse BID  . Chlorhexidine Gluconate Cloth  6 each Topical Daily  . famotidine  10 mg Per Tube Daily  . feeding supplement (PRO-STAT SUGAR FREE 64)  60 mL Per Tube BID  . Gerhardt's butt cream   Topical BID  . heparin  4,000 Units Intravenous Once  . hydrocortisone   Rectal BID  . hydrocortisone  25 mg Rectal BID  . insulin aspart  0-15 Units Subcutaneous TID WC  . insulin aspart  0-5 Units Subcutaneous QHS  . lipase/protease/amylase)  20,880 Units Per Tube Once   And  . sodium bicarbonate  650 mg Per Tube Once  . magic mouthwash  5 mL Oral TID  . mouth rinse  15 mL Mouth Rinse q12n4p   . multivitamin  1 tablet Per Tube QHS  . nutrition supplement (JUVEN)  1 packet Per Tube BID BM  . [START ON 05/15/2019] predniSONE  20 mg Per Tube Q breakfast   Followed by  . [START ON 05/17/2019] predniSONE  10 mg Per Tube Q breakfast  . predniSONE  40 mg Per Tube Q breakfast   Followed by  . [START ON 05/13/2019] predniSONE  30 mg Per Tube Q breakfast  . simethicone  80 mg Oral QID  . sodium chloride flush  10-40 mL Intracatheter Q12H  . sodium chloride flush  10-40 mL Intracatheter Q12H  Elmarie Shiley, MD 05/12/2019, 7:57 AM

## 2019-05-13 ENCOUNTER — Inpatient Hospital Stay (HOSPITAL_COMMUNITY): Payer: Medicare Other | Admitting: Anesthesiology

## 2019-05-13 ENCOUNTER — Encounter (HOSPITAL_COMMUNITY): Payer: Self-pay | Admitting: Cardiothoracic Surgery

## 2019-05-13 ENCOUNTER — Inpatient Hospital Stay (HOSPITAL_COMMUNITY): Payer: Medicare Other

## 2019-05-13 ENCOUNTER — Encounter (HOSPITAL_COMMUNITY)
Admission: EM | Disposition: A | Discharge status: Rehabilitation Facility | Payer: Self-pay | Source: Home / Self Care | Attending: Cardiothoracic Surgery

## 2019-05-13 HISTORY — PX: TRANSURETHRAL RESECTION OF BLADDER NECK: SHX6196

## 2019-05-13 LAB — CBC
HCT: 25.9 % — ABNORMAL LOW (ref 39.0–52.0)
HCT: 34.3 % — ABNORMAL LOW (ref 39.0–52.0)
Hemoglobin: 7.9 g/dL — ABNORMAL LOW (ref 13.0–17.0)
Hemoglobin: 10.9 g/dL — ABNORMAL LOW (ref 13.0–17.0)
MCH: 28.1 pg (ref 26.0–34.0)
MCH: 28.8 pg (ref 26.0–34.0)
MCHC: 30.5 g/dL (ref 30.0–36.0)
MCHC: 31.8 g/dL (ref 30.0–36.0)
MCV: 92.2 fL (ref 80.0–100.0)
MCV: 90.5 fL (ref 80.0–100.0)
Platelets: 116 10*3/uL — ABNORMAL LOW (ref 150–400)
Platelets: 109 10*3/uL — ABNORMAL LOW (ref 150–400)
RBC: 2.81 MIL/uL — ABNORMAL LOW (ref 4.22–5.81)
RBC: 3.79 MIL/uL — ABNORMAL LOW (ref 4.22–5.81)
RDW: 22 % — ABNORMAL HIGH (ref 11.5–15.5)
RDW: 20.7 % — ABNORMAL HIGH (ref 11.5–15.5)
WBC: 6.1 10*3/uL (ref 4.0–10.5)
WBC: 6.7 10*3/uL (ref 4.0–10.5)
nRBC: 0.3 % — ABNORMAL HIGH (ref 0.0–0.2)
nRBC: 0 % (ref 0.0–0.2)

## 2019-05-13 LAB — RENAL FUNCTION PANEL
Albumin: 2.5 g/dL — ABNORMAL LOW (ref 3.5–5.0)
Anion gap: 14 (ref 5–15)
BUN: 131 mg/dL — ABNORMAL HIGH (ref 8–23)
CO2: 25 mmol/L (ref 22–32)
Calcium: 7.9 mg/dL — ABNORMAL LOW (ref 8.9–10.3)
Chloride: 95 mmol/L — ABNORMAL LOW (ref 98–111)
Creatinine, Ser: 4.63 mg/dL — ABNORMAL HIGH (ref 0.61–1.24)
GFR calc Af Amer: 12 mL/min — ABNORMAL LOW (ref >60)
GFR calc non Af Amer: 10 mL/min — ABNORMAL LOW (ref >60)
Glucose, Bld: 102 mg/dL — ABNORMAL HIGH (ref 70–99)
Phosphorus: 7.7 mg/dL — ABNORMAL HIGH (ref 2.5–4.6)
Potassium: 5.6 mmol/L — ABNORMAL HIGH (ref 3.5–5.1)
Sodium: 134 mmol/L — ABNORMAL LOW (ref 135–145)

## 2019-05-13 LAB — GLUCOSE, CAPILLARY
Glucose-Capillary: 104 mg/dL — ABNORMAL HIGH (ref 70–99)
Glucose-Capillary: 149 mg/dL — ABNORMAL HIGH (ref 70–99)
Glucose-Capillary: 175 mg/dL — ABNORMAL HIGH (ref 70–99)
Glucose-Capillary: 181 mg/dL — ABNORMAL HIGH (ref 70–99)

## 2019-05-13 LAB — MAGNESIUM: Magnesium: 2.4 mg/dL (ref 1.7–2.4)

## 2019-05-13 LAB — PREPARE RBC (CROSSMATCH)

## 2019-05-13 SURGERY — RESECTION, BLADDER NECK, TRANSURETHRAL
Anesthesia: General | Site: Urethra

## 2019-05-13 MED ORDER — OXYCODONE HCL 5 MG/5ML PO SOLN: 5.0000 mg | Freq: Once | ORAL | Status: DC | PRN

## 2019-05-13 MED ORDER — LIDOCAINE HCL URETHRAL/MUCOSAL 2 % EX GEL
CUTANEOUS | Status: AC
  Filled 2019-05-13: qty 20

## 2019-05-13 MED ORDER — FENTANYL CITRATE (PF) 100 MCG/2ML IJ SOLN
INTRAMUSCULAR | Status: AC
  Filled 2019-05-13: qty 2

## 2019-05-13 MED ORDER — PHENYLEPHRINE 40 MCG/ML (10ML) SYRINGE FOR IV PUSH (FOR BLOOD PRESSURE SUPPORT)
PREFILLED_SYRINGE | INTRAVENOUS | Status: DC | PRN
  Administered 2019-05-13: 80 ug via INTRAVENOUS

## 2019-05-13 MED ORDER — NEPRO/CARBSTEADY PO LIQD
1000.0000 mL | ORAL | Status: DC
  Administered 2019-05-13: 1000 mL via ORAL

## 2019-05-13 MED ORDER — SODIUM CHLORIDE 0.9 % IV SOLN
INTRAVENOUS | Status: DC | PRN
  Administered 2019-05-13 (×2): 100 mL

## 2019-05-13 MED ORDER — FENTANYL CITRATE (PF) 250 MCG/5ML IJ SOLN
INTRAMUSCULAR | Status: AC
  Filled 2019-05-13: qty 5

## 2019-05-13 MED ORDER — PHENYLEPHRINE HCL-NACL 10-0.9 MG/250ML-% IV SOLN
INTRAVENOUS | Status: DC | PRN
  Administered 2019-05-13: 25 ug/min via INTRAVENOUS

## 2019-05-13 MED ORDER — ONDANSETRON HCL 4 MG/2ML IJ SOLN
INTRAMUSCULAR | Status: AC
  Filled 2019-05-13: qty 2

## 2019-05-13 MED ORDER — HYOSCYAMINE SULFATE 0.125 MG SL SUBL
0.1250 mg | SUBLINGUAL_TABLET | Freq: Four times a day (QID) | SUBLINGUAL | Status: DC | PRN
  Administered 2019-05-13 – 2019-05-14 (×2): 0.125 mg via SUBLINGUAL
  Filled 2019-05-13 (×4): qty 1

## 2019-05-13 MED ORDER — SODIUM CHLORIDE 0.9 % IV SOLN: INTRAVENOUS | Status: DC

## 2019-05-13 MED ORDER — FENTANYL CITRATE (PF) 100 MCG/2ML IJ SOLN
25.0000 ug | INTRAMUSCULAR | Status: DC | PRN
  Administered 2019-05-13 (×2): 50 ug via INTRAVENOUS

## 2019-05-13 MED ORDER — LIDOCAINE 2% (20 MG/ML) 5 ML SYRINGE
INTRAMUSCULAR | Status: DC | PRN
  Administered 2019-05-13: 40 mg via INTRAVENOUS

## 2019-05-13 MED ORDER — PATIROMER SORBITEX CALCIUM 8.4 G PO PACK
16.8000 g | PACK | Freq: Every day | ORAL | Status: DC
  Administered 2019-05-13 – 2019-05-14 (×2): 16.8 g via ORAL
  Filled 2019-05-13 (×6): qty 2

## 2019-05-13 MED ORDER — LIDOCAINE 2% (20 MG/ML) 5 ML SYRINGE
INTRAMUSCULAR | Status: AC
  Filled 2019-05-13: qty 5

## 2019-05-13 MED ORDER — SODIUM CHLORIDE 0.9% IV SOLUTION: Freq: Once | INTRAVENOUS | Status: AC

## 2019-05-13 MED ORDER — 0.9 % SODIUM CHLORIDE (POUR BTL) OPTIME
TOPICAL | Status: DC | PRN
  Administered 2019-05-13: 1000 mL

## 2019-05-13 MED ORDER — BELLADONNA ALKALOIDS-OPIUM 16.2-60 MG RE SUPP
1.0000 | Freq: Three times a day (TID) | RECTAL | Status: DC | PRN
  Administered 2019-05-13 – 2019-05-14 (×2): 1 via RECTAL
  Filled 2019-05-13 (×2): qty 1

## 2019-05-13 MED ORDER — DEXAMETHASONE SODIUM PHOSPHATE 10 MG/ML IJ SOLN
INTRAMUSCULAR | Status: DC | PRN
  Administered 2019-05-13: 4 mg via INTRAVENOUS

## 2019-05-13 MED ORDER — ONDANSETRON HCL 4 MG/2ML IJ SOLN: 4.0000 mg | Freq: Four times a day (QID) | INTRAMUSCULAR | Status: DC | PRN

## 2019-05-13 MED ORDER — PROPOFOL 10 MG/ML IV BOLUS
INTRAVENOUS | Status: AC
  Filled 2019-05-13: qty 20

## 2019-05-13 MED ORDER — ONDANSETRON HCL 4 MG/2ML IJ SOLN
INTRAMUSCULAR | Status: DC | PRN
  Administered 2019-05-13: 4 mg via INTRAVENOUS

## 2019-05-13 MED ORDER — DEXAMETHASONE SODIUM PHOSPHATE 10 MG/ML IJ SOLN
INTRAMUSCULAR | Status: AC
  Filled 2019-05-13: qty 1

## 2019-05-13 MED ORDER — EPOETIN ALFA-EPBX 3000 UNIT/ML IJ SOLN
3000.0000 [IU] | INTRAMUSCULAR | Status: DC
  Administered 2019-05-14: 3000 [IU] via SUBCUTANEOUS
  Filled 2019-05-13 (×3): qty 1

## 2019-05-13 MED ORDER — FENTANYL CITRATE (PF) 250 MCG/5ML IJ SOLN
INTRAMUSCULAR | Status: DC | PRN
  Administered 2019-05-13: 25 ug via INTRAVENOUS

## 2019-05-13 MED ORDER — PROPOFOL 10 MG/ML IV BOLUS
INTRAVENOUS | Status: DC | PRN
  Administered 2019-05-13: 90 mg via INTRAVENOUS

## 2019-05-13 MED ORDER — OXYCODONE HCL 5 MG PO TABS: 5.0000 mg | ORAL_TABLET | Freq: Once | ORAL | Status: DC | PRN

## 2019-05-13 SURGICAL SUPPLY — 25 items
BAG URINE DRAINAGE (UROLOGICAL SUPPLIES) ×2 IMPLANT
BAG URO CATCHER STRL LF (MISCELLANEOUS) ×4 IMPLANT
CATH FOLEY 2WAY SLVR  5CC 20FR (CATHETERS) ×1
CATH FOLEY 2WAY SLVR 5CC 20FR (CATHETERS) IMPLANT
CATH FOLEY 3WAY 30CC 24FR (CATHETERS) ×1
CATH HEMA 3WAY 30CC 22FR COUDE (CATHETERS) ×2 IMPLANT
CATH HEMA 3WAY 30CC 24FR COUDE (CATHETERS) ×1 IMPLANT
CATH URTH STD 24FR FL 3W 2 (CATHETERS) ×1 IMPLANT
COVER FOOTSWITCH UNIV (MISCELLANEOUS) ×2 IMPLANT
GLOVE BIO SURGEON STRL SZ7.5 (GLOVE) ×2 IMPLANT
GOWN STRL REUS W/ TWL LRG LVL3 (GOWN DISPOSABLE) ×1 IMPLANT
GOWN STRL REUS W/ TWL XL LVL3 (GOWN DISPOSABLE) ×1 IMPLANT
GOWN STRL REUS W/TWL LRG LVL3 (GOWN DISPOSABLE) ×1
GOWN STRL REUS W/TWL XL LVL3 (GOWN DISPOSABLE) ×1
HOLDER FOLEY CATH W/STRAP (MISCELLANEOUS) ×2 IMPLANT
KIT TURNOVER KIT B (KITS) ×2 IMPLANT
LOOP CUT BIPOLAR 24F LRG (ELECTROSURGICAL) ×2 IMPLANT
MANIFOLD NEPTUNE II (INSTRUMENTS) ×2 IMPLANT
NS IRRIG 1000ML POUR BTL (IV SOLUTION) ×2 IMPLANT
PACK CYSTO (CUSTOM PROCEDURE TRAY) ×2 IMPLANT
SET IRRIG Y TYPE TUR BLADDER L (SET/KITS/TRAYS/PACK) ×2 IMPLANT
SOL PREP POV-IOD 4OZ 10% (MISCELLANEOUS) ×2 IMPLANT
SYR 30ML LL (SYRINGE) ×2 IMPLANT
SYRINGE IRR TOOMEY STRL 70CC (SYRINGE) ×2 IMPLANT
TUBE CONNECTING 12X1/4 (SUCTIONS) ×2 IMPLANT

## 2019-05-13 NOTE — Progress Notes (Addendum)
      [address]       [place] [address]             [phone]        CARDIOTHORACIC SURGERY PROGRESS NOTE   R13 Days Post-Op Procedure(s) (LRB): CYSTOSCOPY WITH FULGERATION (N/A)  Subjective: Had a good night.  No further episodes suprapubic pain or bradycardia.  Objective: Vital signs: BP Readings from Last 1 Encounters:  05/13/19 (!) 144/55   Pulse Readings from Last 1 Encounters:  05/13/19 77   Resp Readings from Last 1 Encounters:  05/13/19 13   Temp Readings from Last 1 Encounters:  05/13/19 98.4 F (36.9 C)    Hemodynamics:    Physical Exam:  Rhythm:   NSR  Breath sounds: clear  Heart sounds:  RRR  Incisions:  Clean and dry  Abdomen:  Soft, non-distended, non-tender  Extremities:  Warm, well-perfused    Intake/Output from previous day: 03/27 0701 - 03/28 0700 In: 31382.5 [I.V.:96; Blood:381.5; NG/GT:905] Out: 47900 [Urine:47900] Intake/Output this shift: Total I/O In: -  Out: 3000 [Urine:3000]  Lab Results:  CBC: Recent Labs    05/12/19 2041 05/13/19 0344  WBC 8.0 6.1  HGB 9.0* 7.9*  HCT 29.0* 25.9*  PLT 143* 116*    BMET:  Recent Labs    05/12/19 2041 05/13/19 0344  NA 136 134*  K 5.9* 5.6*  CL 97* 95*  CO2 24 25  GLUCOSE 190* 102*  BUN 118* 131*  CREATININE 4.27* 4.63*  CALCIUM 8.2* 7.9*     PT/INR:  No results for input(s): LABPROT, INR in the last 72 hours.  CBG (last 3)  Recent Labs    05/12/19 1631 05/12/19 2217 05/13/19 0646  GLUCAP 256* 182* 104*    ABG    Component Value Date/Time   PHART 7.369 05/10/2019 1804   PCO2ART 34.6 05/10/2019 1804   PO2ART 128.0 (H) 05/10/2019 1804   HCO3 20.0 05/10/2019 1804   TCO2 21 (L) 05/10/2019 1804   ACIDBASEDEF 5.0 (H) 05/10/2019 1804   O2SAT 99.0 05/10/2019 1804    CXR: Mild bibasilar opacity R>L  Assessment/Plan: S/P Procedure(s) (LRB): CYSTOSCOPY WITH FULGERATION (N/A)  Overall stable although still having gross hematuria and Hgb  down to 7.9 from 9.0 yesterday Maintaining NSR w/ stable BP No further vagal episodes overnight Receiving 1 unit PRBCs For repeat cystoscopy later today   Rexene Alberts, MD 05/13/2019 8:28 AM

## 2019-05-13 NOTE — Plan of Care (Signed)
  Problem: Clinical Measurements: Goal: Ability to maintain clinical measurements within normal limits will improve Outcome: Progressing Goal: Respiratory complications will improve Outcome: Progressing Goal: Cardiovascular complication will be avoided Outcome: Progressing   Problem: Activity: Goal: Risk for activity intolerance will decrease Outcome: Progressing   Problem: Coping: Goal: Level of anxiety will decrease Outcome: Progressing   Problem: Pain Managment: Goal: General experience of comfort will improve Outcome: Progressing   Problem: Safety: Goal: Ability to remain free from injury will improve Outcome: Progressing   Problem: Skin Integrity: Goal: Risk for impaired skin integrity will decrease Outcome: Progressing

## 2019-05-13 NOTE — Progress Notes (Signed)
PT Cancellation Note  Patient Details Name: MERLEN GURRY MRN: [id number] DOB: 1928/02/26   Cancelled Treatment:    Reason Eval/Treat Not Completed: Patient at procedure or test/unavailable. Pt in OR for repeat cystoscopy. PT to follow up tomorrow.   Lorriane Shire 05/13/2019, 2:43 PM   Lorrin Goodell, PT  Office # [phone] Pager [phone]

## 2019-05-13 NOTE — Anesthesia Preprocedure Evaluation (Signed)
Anesthesia Evaluation  Patient identified by MRN, date of birth, ID band Patient awake    Reviewed: Allergy & Precautions, H&P , NPO status , Patient's Chart, lab work & pertinent test results  Airway Mallampati: II   Neck ROM: full    Dental   Pulmonary shortness of breath, former smoker,    breath sounds clear to auscultation       Cardiovascular hypertension, + CAD, + Past MI, + CABG and +CHF   Rhythm:regular Rate:Normal     Neuro/Psych    GI/Hepatic GERD  ,  Endo/Other  diabetes  Renal/GU Renal InsufficiencyRenal disease     Musculoskeletal   Abdominal   Peds  Hematology  (+) anemia ,   Anesthesia Other Findings   Reproductive/Obstetrics                             Anesthesia Physical Anesthesia Plan  ASA: III  Anesthesia Plan: General   Post-op Pain Management:    Induction: Intravenous  PONV Risk Score and Plan: 2 and Ondansetron, Dexamethasone and Treatment may vary due to age or medical condition  Airway Management Planned: LMA  Additional Equipment:   Intra-op Plan:   Post-operative Plan:   Informed Consent: I have reviewed the patients History and Physical, chart, labs and discussed the procedure including the risks, benefits and alternatives for the proposed anesthesia with the patient or authorized representative who has indicated his/her understanding and acceptance.       Plan Discussed with: CRNA, Anesthesiologist and Surgeon  Anesthesia Plan Comments:         Anesthesia Quick Evaluation

## 2019-05-13 NOTE — Progress Notes (Signed)
Dr. Gloriann Loan gave verbal order to transfuse 1 unit of PRBC for hemoglobin of 7.9

## 2019-05-13 NOTE — Progress Notes (Signed)
Patient ID: Jesse Murphy, male   DOB: 21-Oct-1928, 86 y.o.   MRN: [id number]   [facility] Progress Note   Assessment/ Plan:   1. Acute kidney Injury: Appears hemodynamically mediated with ATN following postoperative congestive heart failure/cardiogenic shock.  On renal replacement therapy since 2/11 and appears to be tolerating intermittent hemodialysis without problems.  He does not have any renal recovery and will continue dialysis on a MWF schedule; hard to quantify urine output with CBI.  I will switch him to Nepro from [place] to try and maintain control of potassium input-elevated BUN likely in part from ongoing prednisone use/protein loading. 2.  Acute systolic heart failure/cardiogenic shock: Status post Impella support.  He continues to tolerate hemodialysis without midodrine. 3.  Coronary artery disease status post three-vessel CABG 4.  Anemia: Secondary to acute/critical illness and acute blood loss with ongoing hematuria-off Aranesp for suspicion that this was inducing drug rash.  Getting a unit of PRBC today and will switch to Retacrit. 5.  Nutrition: Low albumin likely negative acute phase reactant, monitor with ongoing diet/ONS. 6.  Secondary hyperparathyroidism: Calcium and phosphorus levels currently at goal, monitor with HD. 7.  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  Subjective:   Scheduled for cystoscopy today-continued to have hematuria overnight.   Objective:   BP (!) 144/55 (BP Location: Right Arm)   Pulse 77   Temp 98.4 F (36.9 C)   Resp 13   Ht 5\' 8"  (1.727 m)   Wt 87 kg   SpO2 97%   BMI 29.16 kg/m   Intake/Output Summary (Last 24 hours) at 05/13/2019 0841 Last data filed at 05/13/2019 0746 Gross per 24 hour  Intake 31382.51 ml  Output 50900 ml  Net -19517.49 ml   Weight change: -3.3 kg  Physical Exam: Gen: Comfortably resting in bed,  being cleaned up by nurse. CVS: Pulse regular rhythm, normal rate, S1 and S2 normal Resp: Anteriorly clear to auscultation, no rales.  Left IJ TDC Abd: Soft, obese, nontender Ext: Trace dependent edema.  Imaging: No results found.  Labs: BMET Recent Labs  Lab 05/07/19 0416 05/07/19 0416 05/08/19 0248 05/08/19 0248 05/09/19 0340 05/09/19 0340 05/09/19 1602 05/10/19 0400 05/10/19 1804 05/11/19 0330 05/12/19 0309 05/12/19 2041 05/13/19 0344  NA 133*   < > 135   < > 131*   < > 138 136 129* 136 135 136 134*  K 6.3*   < > 5.4*   < > 6.4*   < > 3.7 4.6 5.8* 6.5* 5.3* 5.9* 5.6*  CL 94*   < > 98   < > 94*  --  97* 97*  --  97* 97* 97* 95*  CO2 27   < > 27   < > 26  --  31 29  --  26 27 24 25   GLUCOSE 188*   < > 166*   < > 194*  --  146* 51*  --  172* 161* 190* 102*  BUN 135*   < > 95*   < > 143*  --  82* 60*  --  112* 77* 118* 131*  CREATININE 5.87*   < > 4.14*   < > 5.36*  --  3.22* 2.91*  --  4.22* 3.13* 4.27* 4.63*  CALCIUM 8.5*   < > 8.2*   < > 8.5*  --  8.5* 8.4*  --  8.6* 8.1* 8.2* 7.9*  PHOS 3.0  --  3.2  --  3.6  --   --  3.5  --  6.3* 7.1*  --  7.7*   < > = values in this interval not displayed.   CBC Recent Labs  Lab 05/11/19 0330 05/12/19 0309 05/12/19 2041 05/13/19 0344  WBC 12.3* 8.6 8.0 6.1  HGB 8.4* 7.8* 9.0* 7.9*  HCT 28.3* 25.8* 29.0* 25.9*  MCV 95.3 94.2 91.5 92.2  PLT 214 151 143* 116*    Medications:    . sodium chloride   Intravenous Once  . sodium chloride   Intravenous Once  . [START ON 05/14/2019] aspirin  81 mg Oral Daily  . calcium acetate (Phos Binder)  667 mg Per Tube TID WC  . chlorhexidine  15 mL Mouth Rinse BID  . Chlorhexidine Gluconate Cloth  6 each Topical Daily  . famotidine  10 mg Per Tube Daily  . feeding supplement (PRO-STAT SUGAR FREE 64)  60 mL Per Tube BID  . irrigation builder   Irrigation Once  . Gerhardt's butt cream   Topical BID  . heparin  4,000 Units Intravenous Once  . hydrocortisone   Rectal BID  . hydrocortisone   25 mg Rectal BID  . insulin aspart  0-15 Units Subcutaneous TID WC  . insulin aspart  0-5 Units Subcutaneous QHS  . magic mouthwash  5 mL Oral TID  . mouth rinse  15 mL Mouth Rinse q12n4p  . multivitamin  1 tablet Per Tube QHS  . nutrition supplement (JUVEN)  1 packet Per Tube BID BM  . patiromer  8.4 g Oral Daily  . [START ON 05/15/2019] predniSONE  20 mg Per Tube Q breakfast   Followed by  . [START ON 05/17/2019] predniSONE  10 mg Per Tube Q breakfast  . predniSONE  30 mg Per Tube Q breakfast  . simethicone  80 mg Oral TID  . sodium chloride flush  10-40 mL Intracatheter Q12H  . sodium chloride flush  10-40 mL Intracatheter Q12H  . sodium chloride irrigation  3,000 mL Bladder Irrigation Q24H   Elmarie Shiley, MD 05/13/2019, 8:41 AM

## 2019-05-13 NOTE — Op Note (Addendum)
Operative Note  Preoperative diagnosis:  1.  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  Same  Procedure(s): 1.  Cystoscopy with clot evacuation and fulguration 2.  Fluoroscopic cystogram with interpretation 3.  Intravesical instillation with formalin  Surgeon: Link Snuffer, MD  Assistants: None  Anesthesia: General  Complications: None immediate  EBL: Minimal  Specimens: 1.  None  Drains/Catheters: 1.  None  Intraoperative findings: 1.  Inflamed and irritated urethra.  There was significant irritation around the bladder neck.  There was significant hypervascularity especially at the bladder neck and lateral walls.  These were extensively fulgurated.  The posterior wall of the bladder and the dome of the bladder had marked irregularities consistent with severe radiation cystitis.  After formalin instillation, there was no active bleeding.  Indication: 86 year old male with radiation cystitis that has been refractory to multiple attempts at stopping it including cystoscopy with clot evacuation and fulguration, alum instillation, carboprost instillation.  He has multiple medical comorbidities and is currently in the cardiac ICU.  He is anuric and on hemodialysis.  Given his refractory hemorrhagic cystitis, decision was made to proceed with the above operation.    Description of procedure:  The patient was identified and consent was obtained.  The patient was taken to the operating room and placed in the supine position.  The patient was placed under general anesthesia.  Perioperative antibiotics were administered.  The patient was placed in dorsal lithotomy.  Patient was prepped and draped in a standard sterile fashion and a timeout was performed.  A 26 French resectoscope with the visual obturator in place was advanced into the urethra and into the bladder.  A small to moderate amount of clot was evacuated.  I then exchanged for the  bipolar working element.  I inspected the entire bladder mucosa.  There was significant hypervascularity and some areas of bleeding around the bladder neck and lateral walls.  There were also some areas of hypervascularity on the posterior wall.  All these areas were fulgurated on bipolar setting.  The dome of the bladder had markedly irregular mucosa but no evidence of obvious tumor.  It appeared to be severe radiation cystitis.  After all hypervascular areas were fulgurated, I drained the bladder.  I withdrew the scope.  I placed a 20 French Foley catheter.  I then instilled 200 cc of contrast into the bladder for cystogram.  Cystogram did not show any evidence of reflux.  There was no evidence of perforation.  I drained the bladder and took another plain film and the bladder drained well without any evidence of perforation or reflux again.  I then instilled 5% formalin into the bladder passively through the catheter.  Once this was instilled, I clamped the catheter and kept the solution in the bladder for 25 minutes.  I then carefully drained the formalin into a container taking care not to get any of the solution on the patient's skin.  I remove the Foley catheter.  I reinserted the resectoscope.  I inspected the entire mucosa and there was no active bleeding noted.  I copiously irrigated the bladder.  I again inspected the bladder mucosa and there was no active bleeding.  Given that he was anuric, I felt the catheter may cause more irritation and may cause more harm.  In addition, there was no active bleeding so I did not feel further irrigation was necessary.  I had already copiously irrigated all of the formalin out of the bladder.  I therefore drained the bladder and withdrew the scope.  Patient tolerated procedure well and was stable postoperatively.  Plan: Continue to monitor.

## 2019-05-13 NOTE — Transfer of Care (Signed)
Immediate Anesthesia Transfer of Care Note  Patient: Jesse Murphy  Procedure(s) Performed: CYSTOSCOPY CLOT EVACUATION FULGRATION CYSTOGRAM AND INSTILLATION OF FORMALIN (N/A Urethra)  Patient Location: PACU  Anesthesia Type:General  Level of Consciousness: sedated and responds to stimulation  Airway & Oxygen Therapy: Patient Spontanous Breathing and Patient connected to face mask oxygen  Post-op Assessment: Report given to RN and Post -op Vital signs reviewed and stable  Post vital signs: Reviewed and stable  Last Vitals:  Vitals Value Taken Time  BP 142/54 05/13/19 1512  Temp    Pulse 79 05/13/19 1514  Resp 16 05/13/19 1514  SpO2 98 % 05/13/19 1514  Vitals shown include unvalidated device data.  Last Pain:  Vitals:   05/13/19 1156  TempSrc: Oral  PainSc:       Patients Stated Pain Goal: 3 (06/89/34 0684)  Complications: No apparent anesthesia complications

## 2019-05-13 NOTE — Progress Notes (Signed)
Pt transferred to Short Stay in preperation for OR procedure.

## 2019-05-13 NOTE — Progress Notes (Signed)
Patient ID: Jesse Murphy, male   DOB: [birth year], 86 y.o.   MRN: [id number]    Advanced Heart Failure Rounding Note  PCP-Cardiologist: Kate Sable, MD   Subjective:    Events - Impella removed 2/23. Intraoperative TEE showed EF ~40% - Had tunneled cath placed 3/9 and left PleurX tube.  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 Emergent TVP placed  Relatively stable night but continue to have hematuria. HGb dropping. I discussed with Urology and they plan to take back to OR today for repeat cystoscopy with additional fulgeration and possible formalin instillment.  Remains in NSR. BP and volume status ok.  No further brady events   Objective:   Weight Range: 87 kg Body mass index is 29.16 kg/m.   Vital Signs:   Temp:  [98.1 F (36.7 C)-99.1 F (37.3 C)] 98.1 F (36.7 C) (03/28 0855) Pulse Rate:  [70-88] 74 (03/28 1000) Resp:  [11-22] 15 (03/28 1000) BP: (109-149)/(36-103) 124/47 (03/28 1000) SpO2:  [92 %-100 %] 97 % (03/28 1000) Weight:  [87 kg] 87 kg (03/28 0500) Last BM Date: 05/12/19  Weight change: Filed Weights   05/11/19 0815 05/11/19 1215 05/13/19 0500  Weight: 90.3 kg 87.3 kg 87 kg    Intake/Output:   Intake/Output Summary (Last 24 hours) at 05/13/2019 1134 Last data filed at 05/13/2019 0930 Gross per 24 hour  Intake 31799.19 ml  Output 49650 ml  Net -17850.81 ml     Physical Exam   General: Sitting up in bed  No resp difficulty HEENT: normal Neck: supple.RIJ introducer Carotids 2+ bilat; no bruits. No lymphadenopathy or thryomegaly appreciated. Cor: PMI nondisplaced. Regular rate & rhythm. No rubs, gallops or murmurs. + tunneled LIJ HD cath Lungs: clear decreased at bases  Abdomen: soft, nontender, nondistended. No hepatosplenomegaly. No bruits or masses. Good bowel  sounds. Extremities: no cyanosis, clubbing, rash, edema Neuro: alert & orientedx3, cranial nerves grossly intact. moves all 4 extremities w/o difficulty. Affect pleasant  Telemetry   Sinus 70s Personally reviewed   Labs    CBC Recent Labs    05/12/19 2041 05/13/19 0344  WBC 8.0 6.1  HGB 9.0* 7.9*  HCT 29.0* 25.9*  MCV 91.5 92.2  PLT 143* 785*   Basic Metabolic Panel Recent Labs    05/12/19 0309 05/12/19 0309 05/12/19 2041 05/13/19 0344  NA 135   < > 136 134*  K 5.3*   < > 5.9* 5.6*  CL 97*   < > 97* 95*  CO2 27   < > 24 25  GLUCOSE 161*   < > 190* 102*  BUN 77*   < > 118* 131*  CREATININE 3.13*   < > 4.27* 4.63*  CALCIUM 8.1*   < > 8.2* 7.9*  MG 2.1  --   --  2.4  PHOS 7.1*  --   --  7.7*   < > = values in this interval not displayed.   Liver Function Tests Recent Labs    05/12/19 2041 05/13/19 0344  AST 22  --   ALT 16  --   ALKPHOS 68  --   BILITOT 1.2  --   PROT 5.1*  --   ALBUMIN 2.6* 2.5*   No results for input(s): LIPASE, AMYLASE in the last 72 hours. Cardiac Enzymes No results  for input(s): CKTOTAL, CKMB, CKMBINDEX, TROPONINI in the last 72 hours.  BNP: BNP (last 3 results) Recent Labs    03/22/19 0401 03/26/19 0626 03/27/19 1255  BNP 340.4* 687.5* 800.4*    ProBNP (last 3 results) No results for input(s): PROBNP in the last 8760 hours.   D-Dimer No results for input(s): DDIMER in the last 72 hours. Hemoglobin A1C No results for input(s): HGBA1C in the last 72 hours. Fasting Lipid Panel No results for input(s): CHOL, HDL, LDLCALC, TRIG, CHOLHDL, LDLDIRECT in the last 72 hours. Thyroid Function Tests No results for input(s): TSH, T4TOTAL, T3FREE, THYROIDAB in the last 72 hours.  Invalid input(s): FREET3  Other results:   Imaging    DG Chest 1 View  Result Date: 05/13/2019 CLINICAL DATA:  History of CABG procedure. EXAM: CHEST  1 VIEW COMPARISON:  Chest radiograph 05/11/2019. FINDINGS: Multiple monitoring leads overlie the  patient. Enteric tube courses inferior to the diaphragm. Left IJ central venous catheter tip projects over the superior vena cava. Right IJ sheath projects over the superior vena cava. Stable enlarged cardiac and mediastinal contours. Small bilateral pleural effusions. Similar patchy mid lower lung airspace opacities. Left chest tube remains in place. No definite pneumothorax. IMPRESSION: Support apparatus as above. Small bilateral effusions and scattered airspace opacities. Electronically Signed   By: Lovey Newcomer M.D.   On: 05/13/2019 09:18     Medications:     Scheduled Medications: . sodium chloride   Intravenous Once  . sodium chloride   Intravenous Once  . [START ON 05/14/2019] aspirin  81 mg Oral Daily  . calcium acetate (Phos Binder)  667 mg Per Tube TID WC  . chlorhexidine  15 mL Mouth Rinse BID  . Chlorhexidine Gluconate Cloth  6 each Topical Daily  . [START ON 05/14/2019] epoetin alfa-epbx (RETACRIT) injection  3,000 Units Subcutaneous Q M,W,F-HD  . famotidine  10 mg Per Tube Daily  . feeding supplement (PRO-STAT SUGAR FREE 64)  60 mL Per Tube BID  . irrigation builder   Irrigation Once  . Gerhardt's butt cream   Topical BID  . heparin  4,000 Units Intravenous Once  . hydrocortisone   Rectal BID  . hydrocortisone  25 mg Rectal BID  . insulin aspart  0-15 Units Subcutaneous TID WC  . insulin aspart  0-5 Units Subcutaneous QHS  . magic mouthwash  5 mL Oral TID  . mouth rinse  15 mL Mouth Rinse q12n4p  . multivitamin  1 tablet Per Tube QHS  . nutrition supplement (JUVEN)  1 packet Per Tube BID BM  . patiromer  16.8 g Oral Daily  . [START ON 05/15/2019] predniSONE  20 mg Per Tube Q breakfast   Followed by  . [START ON 05/17/2019] predniSONE  10 mg Per Tube Q breakfast  . predniSONE  30 mg Per Tube Q breakfast  . simethicone  80 mg Oral TID  . sodium chloride flush  10-40 mL Intracatheter Q12H  . sodium chloride flush  10-40 mL Intracatheter Q12H  . sodium chloride irrigation   3,000 mL Bladder Irrigation Q24H    Infusions: . sodium chloride    . sodium chloride    . sodium chloride Stopped (05/08/19 1833)  .  ceFAZolin (ANCEF) IV    . epinephrine Stopped (05/13/19 0800)  . feeding supplement (NEPRO CARB STEADY)    . ferric gluconate (FERRLECIT/NULECIT) IV Stopped (05/11/19 1439)    PRN Medications: sodium chloride, sodium chloride, oxyCODONE **AND** acetaminophen, dextrose, hydrOXYzine, hyoscyamine, levalbuterol, lidocaine (  PF), lidocaine-prilocaine, loperamide HCl, ondansetron (ZOFRAN) IV, pentafluoroprop-tetrafluoroeth, promethazine, Resource ThickenUp Clear, sodium chloride flush, sodium chloride flush, sodium chloride flush    Assessment/Plan   1. Acute systolic HF -> Cardiogenic shock - post-op echo on 03/20/19 EF 25-30% (pre-op 30-35%. In 12/2017 EF normal) - Required Impella support post CABG. Impella removed 2/23. Post-op TEE EF 40% - Has developed ESRD - Volume status well maintained with iHD on MWF Schedule. Now off midodrine due to rash (unclear which medication caused). BP stable - unable to tolerate GDMT with low BP and ESRD - For iHD tommorow  2. CAD s/p CABG this admit (LIMA->LAD, SVG->OM, SVG->RCA on 03/15/19) - No s/s ischemia - Continue statin.  - Off ASS due to hematuria and BRBPR  3. Acute hypoxic respiratory failure with Pseudomonas PNA - s/p trach on 03/27/19 - completed meropenem for pseudomonas PNA - trach aspirate 3/5 w/ regrowth of Pseudomonas again.   - Completed antibiotic course.  -Trach decannulated 3/13. Stable on [place] - Stable. No change    4. PAF - s/p MAZE and LAA occlusion - Remains in NSR. Off amio due to rash (unclear which agent it was) - No AC with ongoing hematuria  5. AKI -> ESRD - due to shock/ATN.  - Tunneled cath placed 3/9  - He is tolerating iHD. Midodrine now off.  - Vein mapping complete. Nephrology considering permanent access  6. Hematuria - Remains a major issue for him.  Remains on CBI with  frequent need for irrigation due to clots -Cystoscopy 3/15 with fulgeration - off anticoagulation. - Urology started aluminum irrigation and trial of carbiprost - Hematuria ongoing. D/w Urology personally going back to OR today. . - Appreciate Urology input  7. Complete heart block with profound symptomatic bradycardia - likely vagal in nature - s/p emergent TVP 3/25 - resolved TVP out.   8. Debility, severe - continue PT/OT  9. F/E/N - Tube feeds per Cor-trak.     10. Left Pleural Effusion  - moderate to large. - s/p PleurX on 3/8.  - TCTS managing. Draining M/Th  10. Diffuse rash - Appearance of drug induced rash. - Steroids started 3/21 -> much improved - Now tapering steroids. Discussed dosing with PharmD personally. - Can add needed meds back 1 by 1 watching for recurrent rash  11. Anemia: - h/o melena and hematuria this admit. Now with copious BRBPR - monitor. transfuse if < 7.5 - hgb 9.0 -> 7.9  12. Dysphagia and BRBPR - ? Possible esophagitis +/- radiation proctitis - GI following.Treated with IV fluconazole. Improved - Will get EGD if dysphagia recurs  13. Hyperkalemia - K 5.6 today - HD MWF - lokelma as needed on non HD days. - Renal following  Glori Bickers, MD  11:34 AM

## 2019-05-13 NOTE — Anesthesia Procedure Notes (Signed)
Procedure Name: LMA Insertion Date/Time: 05/13/2019 1:37 PM Performed by: Renato Shin, CRNA Pre-anesthesia Checklist: Patient identified, Emergency Drugs available, Patient being monitored and Suction available Patient Re-evaluated:Patient Re-evaluated prior to induction Oxygen Delivery Method: Circle system utilized Preoxygenation: Pre-oxygenation with 100% oxygen Induction Type: IV induction Ventilation: Mask ventilation without difficulty LMA: LMA inserted LMA Size: 4.0 Number of attempts: 1 Placement Confirmation: positive ETCO2 and breath sounds checked- equal and bilateral Tube secured with: Tape Dental Injury: Teeth and Oropharynx as per pre-operative assessment

## 2019-05-13 NOTE — Plan of Care (Signed)
Afebrile. O x 4. Went to OR for bladder procedure. 3 way urinary catheter removed by MD intraop. Patient returned to ICU in stable condition. Neuro intact, c/o penile pain. Pain medication given. On 2L [place]. Passing gas but no BM this shift. Tube feeds restarted with different formulary per Nephrology. HGB stable post procedure.    Problem: Cardiac: Goal: Ability to achieve and maintain adequate cardiopulmonary perfusion will improve Outcome: Progressing   Problem: Health Behavior/Discharge Planning: Goal: Ability to manage health-related needs will improve Outcome: Progressing   Problem: Activity: Goal: Risk for activity intolerance will decrease Outcome: Progressing   Problem: Education: Goal: Knowledge about tracheostomy care/management will improve Outcome: Completed/Met   Problem: Activity: Goal: Ability to tolerate increased activity will improve Outcome: Completed/Met   Problem: Health Behavior/Discharge Planning: Goal: Ability to manage tracheostomy will improve Outcome: Completed/Met   Problem: Respiratory: Goal: Patent airway maintenance will improve Outcome: Completed/Met   Problem: Role Relationship: Goal: Ability to communicate will improve Outcome: Completed/Met

## 2019-05-13 NOTE — Interval H&P Note (Signed)
Patient has had multiple episodes of recurrent gross hematuria and clot retention.  He has radiation cystitis.  He has now failed cystoscopy with fulguration, alum instillation, and carbiprost. History and Physical Interval Note:  05/13/2019 12:13 PM  Jesse Murphy  has presented today for surgery, with the diagnosis of BLADDER DYSFUNCTION.  The various methods of treatment have been discussed with the patient and family. After consideration of risks, benefits and other options for treatment, the patient has consented to  Procedure(s): CYSTOSCOPY CLOT EVACUATION FULGRATION CYSTOGRAM (N/A) as a surgical intervention.  The patient's history has been reviewed, patient examined, no change in status, stable for surgery.  I have reviewed the patient's chart and labs.  Questions were answered to the patient's satisfaction.      Marton Redwood, III

## 2019-05-13 NOTE — Plan of Care (Signed)
  Problem: Clinical Measurements: Goal: Ability to maintain clinical measurements within normal limits will improve Outcome: Progressing Goal: Respiratory complications will improve Outcome: Progressing Goal: Cardiovascular complication will be avoided Outcome: Progressing   Problem: Nutrition: Goal: Adequate nutrition will be maintained Outcome: Progressing   Problem: Coping: Goal: Level of anxiety will decrease Outcome: Progressing   Problem: Pain Managment: Goal: General experience of comfort will improve Outcome: Progressing   Problem: Safety: Goal: Ability to remain free from injury will improve Outcome: Progressing   Problem: Skin Integrity: Goal: Risk for impaired skin integrity will decrease Outcome: Progressing   Problem: Cardiac: Goal: Ability to achieve and maintain adequate cardiopulmonary perfusion will improve Outcome: Progressing

## 2019-05-14 ENCOUNTER — Inpatient Hospital Stay (HOSPITAL_COMMUNITY): Payer: Medicare Other

## 2019-05-14 LAB — RENAL FUNCTION PANEL
Albumin: 2.5 g/dL — ABNORMAL LOW (ref 3.5–5.0)
Anion gap: 17 — ABNORMAL HIGH (ref 5–15)
BUN: 162 mg/dL — ABNORMAL HIGH (ref 8–23)
CO2: 22 mmol/L (ref 22–32)
Calcium: 7.8 mg/dL — ABNORMAL LOW (ref 8.9–10.3)
Chloride: 95 mmol/L — ABNORMAL LOW (ref 98–111)
Creatinine, Ser: 5.54 mg/dL — ABNORMAL HIGH (ref 0.61–1.24)
GFR calc Af Amer: 10 mL/min — ABNORMAL LOW (ref >60)
GFR calc non Af Amer: 8 mL/min — ABNORMAL LOW (ref >60)
Glucose, Bld: 168 mg/dL — ABNORMAL HIGH (ref 70–99)
Phosphorus: 9.2 mg/dL — ABNORMAL HIGH (ref 2.5–4.6)
Potassium: 6.2 mmol/L — ABNORMAL HIGH (ref 3.5–5.1)
Sodium: 134 mmol/L — ABNORMAL LOW (ref 135–145)

## 2019-05-14 LAB — TYPE AND SCREEN
ABO/RH(D): B POS
Antibody Screen: NEGATIVE
Unit division: 0
Unit division: 0

## 2019-05-14 LAB — BPAM RBC
Blood Product Expiration Date: 202104152359
Blood Product Expiration Date: 202104162359
ISSUE DATE / TIME: 202103271541
ISSUE DATE / TIME: 202103280520
Unit Type and Rh: 7300
Unit Type and Rh: 7300

## 2019-05-14 LAB — CBC
HCT: 34.3 % — ABNORMAL LOW (ref 39.0–52.0)
Hemoglobin: 10.9 g/dL — ABNORMAL LOW (ref 13.0–17.0)
MCH: 28.8 pg (ref 26.0–34.0)
MCHC: 31.8 g/dL (ref 30.0–36.0)
MCV: 90.5 fL (ref 80.0–100.0)
Platelets: 115 10*3/uL — ABNORMAL LOW (ref 150–400)
RBC: 3.79 MIL/uL — ABNORMAL LOW (ref 4.22–5.81)
RDW: 20.6 % — ABNORMAL HIGH (ref 11.5–15.5)
WBC: 6.2 10*3/uL (ref 4.0–10.5)
nRBC: 0 % (ref 0.0–0.2)

## 2019-05-14 LAB — MAGNESIUM: Magnesium: 2.6 mg/dL — ABNORMAL HIGH (ref 1.7–2.4)

## 2019-05-14 LAB — GLUCOSE, CAPILLARY
Glucose-Capillary: 156 mg/dL — ABNORMAL HIGH (ref 70–99)
Glucose-Capillary: 133 mg/dL — ABNORMAL HIGH (ref 70–99)
Glucose-Capillary: 193 mg/dL — ABNORMAL HIGH (ref 70–99)
Glucose-Capillary: 152 mg/dL — ABNORMAL HIGH (ref 70–99)

## 2019-05-14 LAB — POTASSIUM: Potassium: 3.4 mmol/L — ABNORMAL LOW (ref 3.5–5.1)

## 2019-05-14 MED ORDER — INSULIN ASPART 100 UNIT/ML ~~LOC~~ SOLN
5.0000 [IU] | Freq: Once | SUBCUTANEOUS | Status: AC
  Administered 2019-05-14: 5 [IU] via INTRAVENOUS

## 2019-05-14 MED ORDER — DEXTROSE 50 % IV SOLN
25.0000 g | Freq: Once | INTRAVENOUS | Status: AC
  Administered 2019-05-14: 25 g via INTRAVENOUS
  Filled 2019-05-14: qty 50

## 2019-05-14 MED ORDER — HEPARIN SODIUM (PORCINE) 1000 UNIT/ML IJ SOLN
1000.0000 [IU] | INTRAMUSCULAR | Status: DC | PRN
  Administered 2019-05-16: 1000 [IU] via INTRAVENOUS
  Filled 2019-05-14 (×2): qty 1

## 2019-05-14 MED ORDER — HEPARIN SODIUM (PORCINE) 1000 UNIT/ML IJ SOLN
INTRAMUSCULAR | Status: AC
  Administered 2019-05-14: 2700 [IU] via INTRAVENOUS
  Filled 2019-05-14: qty 4

## 2019-05-14 MED ORDER — SODIUM BICARBONATE 8.4 % IV SOLN
50.0000 meq | Freq: Once | INTRAVENOUS | Status: AC
  Administered 2019-05-14: 50 meq via INTRAVENOUS
  Filled 2019-05-14: qty 50

## 2019-05-14 NOTE — Progress Notes (Signed)
Physical Therapy Treatment Patient Details Name: Jesse Murphy MRN: [id number] DOB: December 02, 1928 Today's Date: 05/14/2019    History of Present Illness 86 y.o. male with PMH: h/o CAD s/p CABG and clipping of left atrial appendage, who presented with SOB on 2/1. He became hypoxic on 2/2 with pulmonary edema and needed to be intubated on 2/2 and extubated 2/5. Pt underwent impella placement and tracheostomy on 2/9. Pt started on CRRT 2/11. Impella removal and tracheostomy 2/23. CRRT restarted 3/1. Pt transitioned to intermittent HD. Pt on continuous bladder irrigation.s/p cystoscopy with evacuation of clot and fulgoration of bladder neck bleeders 3/15. Pt with bradycardia and with temporary pacer placed 3/26 and removed 3/27. Pt with Cystoscopy with clot evacuation and fulguration on 3/28.     PT Comments    Pt making steady progress. Continue to recommend CIR.    Follow Up Recommendations  CIR     Equipment Recommendations  Other (comment)(defer)    Recommendations for Other Services       Precautions / Restrictions Precautions Precautions: Fall Precaution Comments: watch HR (brady) Restrictions Weight Bearing Restrictions: No RUE Partial Weight Bearing Percentage or Pounds: Can place ~20lbs through his UEs for mobility per MD Other Position/Activity Restrictions: sternal precautions    Mobility  Bed Mobility Overal bed mobility: Needs Assistance Bed Mobility: Supine to Sit;Sit to Sidelying     Supine to sit: Min assist   Sit to sidelying: Min assist General bed mobility comments: Assist to elevate trunk into sitting and assist to bring legs back up into bed returning to sidelying  Transfers Overall transfer level: Needs assistance Equipment used: (EVA walker) Transfers: Sit to/from Stand Sit to Stand: +2 safety/equipment;Min assist         General transfer comment: Assist to bring hips up and for balance  Ambulation/Gait Ambulation/Gait assistance: Min assist;+2  safety/equipment;Mod assist Gait Distance (Feet): 70 Feet(70' x 1, 50' x 1) Assistive device: (EVA walker) Gait Pattern/deviations: Step-through pattern;Decreased step length - right;Decreased step length - left Gait velocity: decr Gait velocity interpretation: <1.31 ft/sec, indicative of household ambulator General Gait Details: Assist for balance and support. Pt with knees buckling as he fatigues. Amb on 2L with SpO2 >90%. HR 80's throughout   Stairs             Wheelchair Mobility    Modified Rankin (Stroke Patients Only)       Balance Overall balance assessment: Needs assistance Sitting-balance support: Feet supported;Single extremity supported Sitting balance-Leahy Scale: Poor Sitting balance - Comments: Min guard-Min A for EOB balance. Postural control: Posterior lean Standing balance support: Bilateral upper extremity supported;During functional activity Standing balance-Leahy Scale: Poor Standing balance comment: Eva walker and min assist for static standing                            Cognition Arousal/Alertness: Awake/alert;Lethargic(Falling asleep unless stimulated.) Behavior During Therapy: Flat affect Overall Cognitive Status: No family/caregiver present to determine baseline cognitive functioning Area of Impairment: Problem solving                             Problem Solving: Slow processing;Requires verbal cues General Comments: Pt joking at times.      Exercises      General Comments        Pertinent Vitals/Pain Pain Assessment: No/denies pain Faces Pain Scale: No hurt    Home Living  Prior Function            PT Goals (current goals can now be found in the care plan section) Acute Rehab PT Goals Patient Stated Goal: get better Progress towards PT goals: Progressing toward goals    Frequency    Min 3X/week      PT Plan Current plan remains appropriate    Co-evaluation  PT/OT/SLP Co-Evaluation/Treatment: Yes Reason for Co-Treatment: Complexity of the patient's impairments (multi-system involvement);For patient/therapist safety          AM-PAC PT "6 Clicks" Mobility   Outcome Measure  Help needed turning from your back to your side while in a flat bed without using bedrails?: A Little Help needed moving from lying on your back to sitting on the side of a flat bed without using bedrails?: A Little Help needed moving to and from a bed to a chair (including a wheelchair)?: A Lot Help needed standing up from a chair using your arms (e.g., wheelchair or bedside chair)?: A Lot Help needed to walk in hospital room?: A Little Help needed climbing 3-5 steps with a railing? : Total 6 Click Score: 14    End of Session Equipment Utilized During Treatment: Gait belt;Oxygen Activity Tolerance: Patient tolerated treatment well Patient left: in bed;with call bell/phone within reach;with bed alarm set Nurse Communication: Mobility status PT Visit Diagnosis: Other abnormalities of gait and mobility (R26.89);Muscle weakness (generalized) (M62.81)     Time: 4010-2725 PT Time Calculation (min) (ACUTE ONLY): 27 min  Charges:  $Gait Training: 8-22 mins                     [place] Pager [phone] Office [place] 05/14/2019, 10:31 AM

## 2019-05-14 NOTE — Progress Notes (Signed)
Occupational Therapy Treatment Patient Details Name: Jesse Murphy MRN: [id number] DOB: 01-08-1929 Today's Date: 05/14/2019    History of present illness 86 y.o. male with PMH: h/o CAD s/p CABG and clipping of left atrial appendage, who presented with SOB on 2/1. He became hypoxic on 2/2 with pulmonary edema and needed to be intubated on 2/2 and extubated 2/5. Pt underwent impella placement and tracheostomy on 2/9. Pt started on CRRT 2/11. Impella removal and tracheostomy 2/23. CRRT restarted 3/1. Pt transitioned to intermittent HD. Pt on continuous bladder irrigation.s/p cystoscopy with evacuation of clot and fulgoration of bladder neck bleeders 3/15. Pt with bradycardia and with temporary pacer placed 3/26 and removed 3/27. Pt with Cystoscopy with clot evacuation and fulguration on 3/28.    OT comments  Pt progressing with mobility out of room with eval walker and minA + 2 for stability and encouragement with chair follow. Pt set-upA at EOB for light grooming. Pt maxA overall for ADL tasks. Pt continues to require cues for energy conservation techniques to know when it is time to take a seated rest break. x2 times BLEs buckled prior to sitting in recliner for rest break. Pt motivated to return to PLOF. Mentation appears to be clearing as pt joking today. Pt VSS. Pt requires continued OT skilled services. OT following.     Follow Up Recommendations  CIR;Supervision/Assistance - 24 hour    Equipment Recommendations  Other (comment)(to be determined)    Recommendations for Other Services      Precautions / Restrictions Precautions Precautions: Fall Precaution Comments: watch HR (brady) Restrictions Weight Bearing Restrictions: No RUE Partial Weight Bearing Percentage or Pounds: Can place ~20lbs through his UEs for mobility per MD Other Position/Activity Restrictions: sternal precautions       Mobility Bed Mobility Overal bed mobility: Needs Assistance Bed Mobility: Supine to Sit;Sit  to Sidelying     Supine to sit: Min assist   Sit to sidelying: Min assist General bed mobility comments: Assist to elevate trunk into sitting and assist to bring legs back up into bed returning to sidelying  Transfers Overall transfer level: Needs assistance   Transfers: Sit to/from Stand Sit to Stand: +2 safety/equipment;Min assist         General transfer comment: Assist to bring hips up and for balance    Balance Overall balance assessment: Needs assistance Sitting-balance support: Feet supported;Single extremity supported Sitting balance-Leahy Scale: Poor Sitting balance - Comments: Min guard-Min A for EOB balance. Postural control: Posterior lean Standing balance support: Bilateral upper extremity supported;During functional activity Standing balance-Leahy Scale: Poor Standing balance comment: Eva walker and min assist for static standing                           ADL either performed or assessed with clinical judgement   ADL Overall ADL's : Needs assistance/impaired Eating/Feeding: Set up;Sitting   Grooming: Set up;Sitting Grooming Details (indicate cue type and reason): Pt denying need to stand for task after mobility task                             Functional mobility during ADLs: Minimal assistance;+2 for physical assistance;+2 for safety/equipment;Rolling walker(EVA walker) General ADL Comments: Pt increasing activity tolerance.      Vision       Perception     Praxis      Cognition Arousal/Alertness: Awake/alert;Lethargic Behavior During Therapy: Flat affect Overall Cognitive  Status: No family/caregiver present to determine baseline cognitive functioning Area of Impairment: Problem solving                             Problem Solving: Slow processing;Requires verbal cues General Comments: Pt light spirited and agreeable to therapy        Exercises     Shoulder Instructions       General Comments Pt's  vital signs stable. BP 119/59  after exertion; 116/53 at EOB with "dizziness"    Pertinent Vitals/ Pain       Pain Assessment: No/denies pain Faces Pain Scale: No hurt  Home Living                                          Prior Functioning/Environment              Frequency  Min 2X/week        Progress Toward Goals  OT Goals(current goals can now be found in the care plan section)  Progress towards OT goals: Progressing toward goals  Acute Rehab OT Goals Patient Stated Goal: get better OT Goal Formulation: With patient Time For Goal Achievement: 05/28/19 Potential to Achieve Goals: Good ADL Goals Pt Will Perform Grooming: with min guard assist;standing Pt Will Perform Lower Body Bathing: with min assist;sit to/from stand;sitting/lateral leans Pt Will Perform Lower Body Dressing: with min assist;sitting/lateral leans;sit to/from stand Pt Will Transfer to Toilet: with min assist;ambulating;bedside commode Additional ADL Goal #1: Pt will recall and apply 1-3 ECS strategies independently to improve BADL function  Plan Discharge plan remains appropriate;Frequency remains appropriate    Co-evaluation    PT/OT/SLP Co-Evaluation/Treatment: Yes Reason for Co-Treatment: Complexity of the patient's impairments (multi-system involvement)   OT goals addressed during session: ADL's and self-care      AM-PAC OT "6 Clicks" Daily Activity     Outcome Measure   Help from another person eating meals?: Total(NPO cortrak) Help from another person taking care of personal grooming?: A Little Help from another person toileting, which includes using toliet, bedpan, or urinal?: A Lot Help from another person bathing (including washing, rinsing, drying)?: A Lot Help from another person to put on and taking off regular upper body clothing?: A Little Help from another person to put on and taking off regular lower body clothing?: A Lot 6 Click Score: 13    End of  Session Equipment Utilized During Treatment: Gait belt;Back brace;Other (comment)(eval walker)  OT Visit Diagnosis: Unsteadiness on feet (R26.81);Muscle weakness (generalized) (M62.81)   Activity Tolerance Patient tolerated treatment well   Patient Left in bed;with call bell/phone within reach;with bed alarm set   Nurse Communication Mobility status        Time: 2836-6294 OT Time Calculation (min): 28 min  Charges: OT General Charges $OT Visit: 1 Visit OT Treatments $Self Care/Home Management : 8-22 mins  Jefferey Pica, OTR/L Acute Rehabilitation Services Pager: [phone] Office: [phone]    Leobardo Granlund C 05/14/2019, 4:37 PM

## 2019-05-14 NOTE — Significant Event (Addendum)
PleurX drainage started at 1030am. Unable to drain more than 450cc out, when patient continues to complain of severe pain, even with drainage flow adjusted using the tubing roller clamp. Patient requesting RN to stop. Procedure stopped and new dressing applied after cleansing site using sterile technique.     Alisa Stjames

## 2019-05-14 NOTE — Progress Notes (Signed)
Patient went into ventricular standstill (7 Second pause). Morning labs were collected. Potassium 6.2, Phosphorous 9.2, and Magnesium 2.6. Nephrologist was called and verbal orders were to give 1 amp of bicarbonate, 5 units of regular insulin, and 25g of IV dextrose. Will continue to monitor.

## 2019-05-14 NOTE — Progress Notes (Signed)
Inpatient Rehab Admissions Coordinator:   Met with pt at bedside. Continued interest in CIR.  No beds available today.  Will continue to follow for timing of possible CIR admission pending medical readiness and bed availability.   Shann Medal, PT, DPT Admissions Coordinator [phone] 05/14/19  12:51 PM

## 2019-05-14 NOTE — Progress Notes (Signed)
Patient has no complaints.  No bladder pain.  Hemoglobin is stable  He has been having some minor incontinence.  He is making a small amount of urine it looks like.  This is been clear.  His penis is covered with a towel which was moist with no bloody fluid.  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  Hopefully this has finally taken care of his bleeding.

## 2019-05-14 NOTE — Progress Notes (Signed)
[facility] ROUNDING NOTE   Subjective:   This is an 86 year old gentleman with a fairly long hospitalization developed acute systolic heart failure cardiogenic shock requiring Impella support post CABG postop ejection fraction 40% Impella removed 04/10/2019.  Status post CABG this admit.  History of paroxysmal atrial fibrillation status post maze LAA occlusion.  Problems with recurrent hematuria for bladder lesion followed by urology appreciate assistance from Dr. Gloriann Loan.  Now dialysis dependent since 04/08/2019.  Sodium 134 potassium 6.2 chloride 95 CO2 22 BUN 162 creatinine 5.54 glucose 168 calcium 7.8 phosphorus 9.2 magnesium 2.6 albumin 2.5 hemoglobin 10.9 platelets 115  Aspirin 81 mg daily Phoslyra 660 mg 3 times daily, Retacrit 3000 units Monday Wednesday Friday Pepcid 10 mg daily Veltassa 16.8 g administered 05/14/2019, Deltasone 30 mg daily, new Lissette 125 mg Monday Wednesday Friday  Blood pressure 125/50 pulse 74 temperature 98.2 O2 sats 91% 2 L nasal cannula  Objective:  Vital signs in last 24 hours:  Temp:  [97.5 F (36.4 C)-98.4 F (36.9 C)] 98.2 F (36.8 C) (03/29 0400) Pulse Rate:  [70-80] 80 (03/29 0700) Resp:  [11-28] 14 (03/29 0700) BP: (118-150)/(44-56) 131/49 (03/29 0700) SpO2:  [90 %-100 %] 94 % (03/29 0700) Weight:  [85.5 kg-87 kg] 85.5 kg (03/29 0156)  Weight change: 0 kg Filed Weights   05/13/19 0500 05/13/19 1229 05/14/19 0156  Weight: 87 kg 87 kg 85.5 kg    Intake/Output: I/O last 3 completed shifts: In: 16939.7 [I.V.:74.7; Blood:315; Other:15000; NG/GT:1450; IV Piggyback:100] Out: 81017 [Urine:34650]   Intake/Output this shift:  No intake/output data recorded.  Gen: Comfortably resting in bed, being cleaned up by nurse. CVS: Pulse regular rhythm, normal rate, S1 and S2 normal Resp: Anteriorly clear to auscultation, no rales.  Left IJ TDC Abd: Soft, obese, nontender Ext: Trace dependent edema.   Basic Metabolic Panel: Recent Labs   Lab 05/10/19 0400 05/10/19 1804 05/11/19 0330 05/11/19 0330 05/12/19 0309 05/12/19 0309 05/12/19 2041 05/13/19 0344 05/14/19 0142  NA 136   < > 136  --  135  --  136 134* 134*  K 4.6   < > 6.5*  --  5.3*  --  5.9* 5.6* 6.2*  CL 97*  --  97*  --  97*  --  97* 95* 95*  CO2 29  --  26  --  27  --  24 25 22   GLUCOSE 51*  --  172*  --  161*  --  190* 102* 168*  BUN 60*  --  112*  --  77*  --  118* 131* 162*  CREATININE 2.91*  --  4.22*  --  3.13*  --  4.27* 4.63* 5.54*  CALCIUM 8.4*  --  8.6*   < > 8.1*   < > 8.2* 7.9* 7.8*  MG 2.1  --  2.5*  --  2.1  --   --  2.4 2.6*  PHOS 3.5  --  6.3*  --  7.1*  --   --  7.7* 9.2*   < > = values in this interval not displayed.    Liver Function Tests: Recent Labs  Lab 05/11/19 0330 05/12/19 0309 05/12/19 2041 05/13/19 0344 05/14/19 0142  AST  --   --  22  --   --   ALT  --   --  16  --   --   ALKPHOS  --   --  68  --   --   BILITOT  --   --  1.2  --   --   PROT  --   --  5.1*  --   --   ALBUMIN 2.8* 2.5* 2.6* 2.5* 2.5*   No results for input(s): LIPASE, AMYLASE in the last 168 hours. No results for input(s): AMMONIA in the last 168 hours.  CBC: Recent Labs  Lab 05/12/19 0309 05/12/19 2041 05/13/19 0344 05/13/19 1630 05/14/19 0142  WBC 8.6 8.0 6.1 6.7 6.2  HGB 7.8* 9.0* 7.9* 10.9* 10.9*  HCT 25.8* 29.0* 25.9* 34.3* 34.3*  MCV 94.2 91.5 92.2 90.5 90.5  PLT 151 143* 116* 109* 115*    Cardiac Enzymes: No results for input(s): CKTOTAL, CKMB, CKMBINDEX, TROPONINI in the last 168 hours.  BNP: Invalid input(s): POCBNP  CBG: Recent Labs  Lab 05/13/19 0646 05/13/19 1202 05/13/19 1553 05/13/19 2114 05/14/19 0636  GLUCAP 104* 149* 175* 181* 156*    Microbiology: Results for orders placed or performed during the hospital encounter of 03/13/19  Respiratory Panel by RT PCR (Flu A&B, Covid) - Nasopharyngeal Swab     Status: None   Collection Time: 03/13/19  1:49 PM   Specimen: Nasopharyngeal Swab  Result Value Ref Range  Status   SARS Coronavirus 2 by RT PCR NEGATIVE NEGATIVE Final    Comment: (NOTE) SARS-CoV-2 target nucleic acids are NOT DETECTED. The SARS-CoV-2 RNA is generally detectable in upper respiratoy specimens during the acute phase of infection. The lowest concentration of SARS-CoV-2 viral copies this assay can detect is 131 copies/mL. A negative result does not preclude SARS-Cov-2 infection and should not be used as the sole basis for treatment or other patient management decisions. A negative result may occur with  improper specimen collection/handling, submission of specimen other than nasopharyngeal swab, presence of viral mutation(s) within the areas targeted by this assay, and inadequate number of viral copies (<131 copies/mL). A negative result must be combined with clinical observations, patient history, and epidemiological information. The expected result is Negative. Fact Sheet for Patients:  PinkCheek.be Fact Sheet for Healthcare Providers:  GravelBags.it This test is not yet ap proved or cleared by the [place] FDA and  has been authorized for detection and/or diagnosis of SARS-CoV-2 by FDA under an Emergency Use Authorization (EUA). This EUA will remain  in effect (meaning this test can be used) for the duration of the COVID-19 declaration under Section 564(b)(1) of the Act, 21 U.S.C. section 360bbb-3(b)(1), unless the authorization is terminated or revoked sooner.    Influenza A by PCR NEGATIVE NEGATIVE Final   Influenza B by PCR NEGATIVE NEGATIVE Final    Comment: (NOTE) The Xpert Xpress SARS-CoV-2/FLU/RSV assay is intended as an aid in  the diagnosis of influenza from Nasopharyngeal swab specimens and  should not be used as a sole basis for treatment. Nasal washings and  aspirates are unacceptable for Xpert Xpress SARS-CoV-2/FLU/RSV  testing. Fact Sheet for  Patients: PinkCheek.be Fact Sheet for Healthcare Providers: GravelBags.it This test is not yet approved or cleared by the [place] FDA and  has been authorized for detection and/or diagnosis of SARS-CoV-2 by  FDA under an Emergency Use Authorization (EUA). This EUA will remain  in effect (meaning this test can be used) for the duration of the  Covid-19 declaration under Section 564(b)(1) of the Act, 21  U.S.C. section 360bbb-3(b)(1), unless the authorization is  terminated or revoked. Performed at [facility], [address], [place], [place] [address]   MRSA PCR Screening     Status: None   Collection Time: 03/13/19  5:18 PM   Specimen: Nasal Mucosa; Nasopharyngeal  Result Value Ref Range Status   MRSA by PCR NEGATIVE NEGATIVE Final    Comment:        The GeneXpert MRSA Assay (FDA approved for NASAL specimens only), is one component of a comprehensive MRSA colonization surveillance program. It is not intended to diagnose MRSA infection nor to guide or monitor treatment for MRSA infections. Performed at [facility], [address] [place], [place] [address]   Surgical pcr screen     Status: None   Collection Time: 03/15/19  9:28 PM   Specimen: Nasal Mucosa; Nasal Swab  Result Value Ref Range Status   MRSA, PCR NEGATIVE NEGATIVE Final   Staphylococcus aureus NEGATIVE NEGATIVE Final    Comment: (NOTE) The Xpert SA Assay (FDA approved for NASAL specimens in patients 27 years of age and older), is one component of a comprehensive surveillance program. It is not intended to diagnose infection nor to guide or monitor treatment. Performed at [facility], [place] [address], [place], [place] [address]   Culture, respiratory (non-expectorated)     Status: None   Collection Time: 03/19/19  3:00 PM   Specimen: Tracheal Aspirate; Respiratory  Result Value Ref Range Status   Specimen Description TRACHEAL ASPIRATE   Final   Special Requests Normal  Final   Gram Stain   Final    ABUNDANT WBC PRESENT,BOTH PMN AND MONONUCLEAR FEW GRAM POSITIVE COCCI RARE GRAM NEGATIVE RODS    Culture   Final    FEW Consistent with normal respiratory flora. Performed at [facility], [place] [address] [place], [place] [address]    Report Status 03/21/2019 FINAL  Final  Culture, respiratory (non-expectorated)     Status: None   Collection Time: 03/26/19  6:29 AM   Specimen: Tracheal Aspirate; Respiratory  Result Value Ref Range Status   Specimen Description TRACHEAL ASPIRATE  Final   Special Requests NONE  Final   Gram Stain   Final    ABUNDANT WBC PRESENT,BOTH PMN AND MONONUCLEAR NO ORGANISMS SEEN Performed at [facility], [address] [place],  [address]    Culture FEW PSEUDOMONAS AERUGINOSA  Final   Report Status 03/28/2019 FINAL  Final   Organism ID, Bacteria PSEUDOMONAS AERUGINOSA  Final      Susceptibility   Pseudomonas aeruginosa - MIC*    CEFTAZIDIME 8 SENSITIVE Sensitive     CIPROFLOXACIN <=0.25 SENSITIVE Sensitive     GENTAMICIN 4 SENSITIVE Sensitive     IMIPENEM 2 SENSITIVE Sensitive     * FEW PSEUDOMONAS AERUGINOSA  Culture, respiratory (non-expectorated)     Status: None   Collection Time: 04/03/19 11:08 AM   Specimen: Tracheal Aspirate; Respiratory  Result Value Ref Range Status   Specimen Description TRACHEAL ASPIRATE  Final   Special Requests NONE  Final   Gram Stain   Final    FEW WBC PRESENT, PREDOMINANTLY PMN FEW GRAM NEGATIVE RODS RARE YEAST    Culture   Final    FEW PSEUDOMONAS AERUGINOSA FEW CANDIDA TROPICALIS    Report Status 04/06/2019 FINAL  Final   Organism ID, Bacteria PSEUDOMONAS AERUGINOSA  Final      Susceptibility   Pseudomonas aeruginosa - MIC*    CEFTAZIDIME 32 RESISTANT Resistant     CIPROFLOXACIN <=0.25 SENSITIVE Sensitive     GENTAMICIN 4 SENSITIVE Sensitive     IMIPENEM Value in next row Sensitive      2 SENSITIVEPerformed at [facility], [place] [address], [place], [place] [address]    * FEW PSEUDOMONAS AERUGINOSA  Culture, blood (Routine X 2) w Reflex to ID Panel     Status: None   Collection Time: 04/03/19 12:30 PM   Specimen: BLOOD LEFT HAND  Result Value Ref Range Status   Specimen Description BLOOD LEFT HAND  Final   Special Requests   Final    BOTTLES DRAWN AEROBIC ONLY Blood Culture results may not be optimal due to an inadequate volume of blood received in culture bottles   Culture   Final    NO GROWTH 5 DAYS Performed at [facility], [place] [address], [place], [place] [address]    Report Status 04/08/2019 FINAL  Final  Culture, blood (Routine X 2) w Reflex to ID Panel     Status: None   Collection Time: 04/03/19 12:35 PM   Specimen: BLOOD LEFT HAND  Result Value Ref Range Status   Specimen Description BLOOD LEFT HAND  Final   Special Requests   Final    BOTTLES DRAWN AEROBIC ONLY Blood Culture results may not be optimal due to an inadequate volume of blood received in culture bottles   Culture   Final    NO GROWTH 5 DAYS Performed at [facility], [place] [address], [place], [place] [address]    Report Status 04/08/2019 FINAL  Final  Culture, respiratory (non-expectorated)     Status: None   Collection Time: 04/07/19 11:10 AM   Specimen: Tracheal Aspirate; Respiratory  Result Value Ref Range Status   Specimen Description TRACHEAL ASPIRATE  Final   Special Requests NONE  Final   Gram Stain   Final    MODERATE WBC PRESENT, PREDOMINANTLY PMN ABUNDANT GRAM NEGATIVE RODS Performed at [facility], [place] [address] [place], [place] [address]    Culture ABUNDANT PSEUDOMONAS AERUGINOSA  Final   Report Status 04/09/2019 FINAL  Final   Organism ID, Bacteria PSEUDOMONAS AERUGINOSA  Final      Susceptibility   Pseudomonas aeruginosa - MIC*    CEFTAZIDIME 16 INTERMEDIATE Intermediate     CIPROFLOXACIN <=0.25 SENSITIVE Sensitive     GENTAMICIN 4 SENSITIVE Sensitive     IMIPENEM 1 SENSITIVE  Sensitive     * ABUNDANT PSEUDOMONAS AERUGINOSA  Culture, blood (routine x 2)     Status: None   Collection Time: 04/07/19  2:07 PM   Specimen: BLOOD LEFT HAND  Result Value Ref Range Status   Specimen Description BLOOD LEFT HAND  Final   Special Requests   Final    BOTTLES DRAWN AEROBIC ONLY Blood Culture adequate volume Performed at [facility], [place] [address], [place], [place] [address]    Culture NO GROWTH 5 DAYS  Final   Report Status 04/12/2019 FINAL  Final  Culture, blood (routine x 2)     Status: None   Collection Time: 04/07/19  2:07 PM   Specimen: BLOOD LEFT HAND  Result Value Ref Range Status   Specimen Description BLOOD LEFT HAND  Final   Special Requests   Final    BOTTLES DRAWN AEROBIC ONLY Blood Culture adequate volume Performed at [facility], [place] [address] [place], [place] [address]    Culture NO GROWTH 5 DAYS  Final   Report Status 04/12/2019 FINAL  Final  Culture, Urine     Status: None   Collection Time: 04/13/19  6:58 PM   Specimen: Urine, Catheterized  Result Value Ref  Range Status   Specimen Description URINE, CATHETERIZED  Final   Special Requests NONE  Final   Culture   Final    NO GROWTH Performed at [facility], [address], [place], [place] [address]    Report Status 04/14/2019 FINAL  Final  MRSA PCR Screening     Status: None   Collection Time: 04/15/19 12:53 PM   Specimen: Nasal Mucosa; Nasopharyngeal  Result Value Ref Range Status   MRSA by PCR NEGATIVE NEGATIVE Final    Comment:        The GeneXpert MRSA Assay (FDA approved for NASAL specimens only), is one component of a comprehensive MRSA colonization surveillance program. It is not intended to diagnose MRSA infection nor to guide or monitor treatment for MRSA infections. Performed at [facility], [place] [address] [place], [place] [address]   Culture, respiratory (non-expectorated)     Status: None   Collection Time: 04/20/19 11:35 AM   Specimen:  Tracheal Aspirate; Respiratory  Result Value Ref Range Status   Specimen Description TRACHEAL ASPIRATE  Final   Special Requests Normal  Final   Gram Stain   Final    RARE WBC PRESENT, PREDOMINANTLY PMN FEW GRAM NEGATIVE RODS FEW GRAM POSITIVE RODS Performed at [facility], [place] [address] [place], [place] [address]    Culture MODERATE PSEUDOMONAS AERUGINOSA  Final   Report Status 04/23/2019 FINAL  Final   Organism ID, Bacteria PSEUDOMONAS AERUGINOSA  Final      Susceptibility   Pseudomonas aeruginosa - MIC*    CEFTAZIDIME >=64 RESISTANT Resistant     CIPROFLOXACIN <=0.25 SENSITIVE Sensitive     GENTAMICIN 4 SENSITIVE Sensitive     IMIPENEM >=16 RESISTANT Resistant     * MODERATE PSEUDOMONAS AERUGINOSA  Body fluid culture     Status: None   Collection Time: 04/23/19 12:02 PM   Specimen: Pleural, Left; Body Fluid  Result Value Ref Range Status   Specimen Description PLEURAL LEFT  Final   Special Requests NONE  Final   Gram Stain   Final    ABUNDANT WBC PRESENT,BOTH PMN AND MONONUCLEAR NO ORGANISMS SEEN    Culture   Final    NO GROWTH 3 DAYS Performed at [facility], [address], [place], [place] [address]    Report Status 04/26/2019 FINAL  Final    Coagulation Studies: No results for input(s): LABPROT, INR in the last 72 hours.  Urinalysis: No results for input(s): COLORURINE, LABSPEC, PHURINE, GLUCOSEU, HGBUR, BILIRUBINUR, KETONESUR, PROTEINUR, UROBILINOGEN, NITRITE, LEUKOCYTESUR in the last 72 hours.  Invalid input(s): APPERANCEUR    Imaging: DG Chest 1 View  Result Date: 05/13/2019 CLINICAL DATA:  History of CABG procedure. EXAM: CHEST  1 VIEW COMPARISON:  Chest radiograph 05/11/2019. FINDINGS: Multiple monitoring leads overlie the patient. Enteric tube courses inferior to the diaphragm. Left IJ central venous catheter tip projects over the superior vena cava. Right IJ sheath projects over the superior vena cava. Stable enlarged cardiac and  mediastinal contours. Small bilateral pleural effusions. Similar patchy mid lower lung airspace opacities. Left chest tube remains in place. No definite pneumothorax. IMPRESSION: Support apparatus as above. Small bilateral effusions and scattered airspace opacities. Electronically Signed   By: Lovey Newcomer M.D.   On: 05/13/2019 09:18     Medications:   . sodium chloride    . sodium chloride    . sodium chloride Stopped (05/08/19 1833)  . epinephrine Stopped (05/13/19 0800)  . feeding  supplement (NEPRO CARB STEADY) 1,000 mL (05/13/19 1620)  . ferric gluconate (FERRLECIT/NULECIT) IV Stopped (05/11/19 1439)   . sodium chloride   Intravenous Once  . aspirin  81 mg Oral Daily  . calcium acetate (Phos Binder)  667 mg Per Tube TID WC  . chlorhexidine  15 mL Mouth Rinse BID  . Chlorhexidine Gluconate Cloth  6 each Topical Daily  . epoetin alfa-epbx (RETACRIT) injection  3,000 Units Subcutaneous Q M,W,F-HD  . famotidine  10 mg Per Tube Daily  . feeding supplement (PRO-STAT SUGAR FREE 64)  60 mL Per Tube BID  . Gerhardt's butt cream   Topical BID  . heparin  4,000 Units Intravenous Once  . hydrocortisone   Rectal BID  . hydrocortisone  25 mg Rectal BID  . insulin aspart  0-15 Units Subcutaneous TID WC  . insulin aspart  0-5 Units Subcutaneous QHS  . magic mouthwash  5 mL Oral TID  . mouth rinse  15 mL Mouth Rinse q12n4p  . multivitamin  1 tablet Per Tube QHS  . nutrition supplement (JUVEN)  1 packet Per Tube BID BM  . patiromer  16.8 g Oral Daily  . [START ON 05/15/2019] predniSONE  20 mg Per Tube Q breakfast   Followed by  . [START ON 05/17/2019] predniSONE  10 mg Per Tube Q breakfast  . predniSONE  30 mg Per Tube Q breakfast  . simethicone  80 mg Oral TID  . sodium chloride flush  10-40 mL Intracatheter Q12H  . sodium chloride flush  10-40 mL Intracatheter Q12H  . sodium chloride irrigation  3,000 mL Bladder Irrigation Q24H   sodium chloride, sodium chloride, oxyCODONE **AND**  acetaminophen, dextrose, hydrOXYzine, hyoscyamine, levalbuterol, lidocaine (PF), lidocaine-prilocaine, loperamide HCl, ondansetron (ZOFRAN) IV, opium-belladonna, pentafluoroprop-tetrafluoroeth, promethazine, Resource ThickenUp Clear, sodium chloride flush, sodium chloride flush, sodium chloride flush  Assessment/ Plan:  1. Acute kidney Injury: Appears hemodynamically mediated with ATN following postoperative congestive heart failure/cardiogenic shock.  On renal replacement therapy since 2/11 and appears to be tolerating intermittent hemodialysis without problems.  He does not have any renal recovery and will continue dialysis on a MWF schedule; hard to quantify urine output with CBI.  I will switch him to Nepro from [place] to try and maintain control of potassium input-elevated BUN likely in part from ongoing prednisone use/protein loading. 2.  Acute systolic heart failure/cardiogenic shock: Status post Impella support.  He continues to tolerate hemodialysis without midodrine. 3.  Coronary artery disease status post three-vessel CABG 4.  Anemia: Secondary to acute/critical illness and acute blood loss with ongoing hematuria-off Aranesp for suspicion that this was inducing drug rash.  Getting a unit of PRBC today and will switch to Retacrit. 5.  Nutrition: Low albumin likely negative acute phase reactant, monitor with ongoing diet/ONS. 6.  Secondary hyperparathyroidism: Calcium and phosphorus levels currently at goal, monitor with HD. 7.  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  Hyperkalemia.  Noted 05/14/2019 dialysis planned we will continue to follow.  LOS: [place] @TODAY @7 :29 AM

## 2019-05-14 NOTE — Progress Notes (Signed)
Gave Dr. Roxy Manns an update on patient's rhythm change and current labs. No new orders at this time.

## 2019-05-14 NOTE — Progress Notes (Signed)
EVENING ROUNDS NOTE :     [place]       [place] [address]             [phone]                 1 Day Post-Op Procedure(s) (LRB): CYSTOSCOPY CLOT EVACUATION FULGRATION CYSTOGRAM AND INSTILLATION OF FORMALIN (N/A)   Total Length of Stay:  LOS: 61 days  Events:  No events Doing well Urology on board    BP (!) 114/56 (BP Location: Right Arm)   Pulse 77   Temp 97.7 F (36.5 C) (Axillary)   Resp 17   Ht 5' 7.99" (1.727 m)   Wt 84.5 kg   SpO2 95%   BMI 28.33 kg/m         . sodium chloride    . sodium chloride    . sodium chloride Stopped (05/08/19 1833)  . epinephrine Stopped (05/13/19 0800)  . feeding supplement (NEPRO CARB STEADY) 1,000 mL (05/13/19 1620)    I/O last 3 completed shifts: In: 30 [I.V.:60; Blood:315; Other:1000; NG/GT:2160; IV Piggyback:150] Out: 6371 [Urine:5425; Other:496; Chest Tube:450]   CBC Latest Ref Rng & Units 05/14/2019 05/13/2019 05/13/2019  WBC 4.0 - 10.5 K/uL 6.2 6.7 6.1  Hemoglobin 13.0 - 17.0 g/dL 10.9(L) 10.9(L) 7.9(L)  Hematocrit 39.0 - 52.0 % 34.3(L) 34.3(L) 25.9(L)  Platelets 150 - 400 K/uL 115(L) 109(L) 116(L)    BMP Latest Ref Rng & Units 05/14/2019 05/14/2019 05/13/2019  Glucose 70 - 99 mg/dL - 168(H) 102(H)  BUN 8 - 23 mg/dL - 162(H) 131(H)  Creatinine 0.61 - 1.24 mg/dL - 5.54(H) 4.63(H)  Sodium 135 - 145 mmol/L - 134(L) 134(L)  Potassium 3.5 - 5.1 mmol/L 3.4(L) 6.2(H) 5.6(H)  Chloride 98 - 111 mmol/L - 95(L) 95(L)  CO2 22 - 32 mmol/L - 22 25  Calcium 8.9 - 10.3 mg/dL - 7.8(L) 7.9(L)    ABG    Component Value Date/Time   PHART 7.369 05/10/2019 1804   PCO2ART 34.6 05/10/2019 1804   PO2ART 128.0 (H) 05/10/2019 1804   HCO3 20.0 05/10/2019 1804   TCO2 21 (L) 05/10/2019 1804   ACIDBASEDEF 5.0 (H) 05/10/2019 1804   O2SAT 99.0 05/10/2019 1804       Melodie Bouillon, MD 05/14/2019 7:15 PM

## 2019-05-14 NOTE — Progress Notes (Addendum)
[address]Suite [address]       [place] [address]             [phone]      1 Day Post-Op Procedure(s) (LRB): CYSTOSCOPY CLOT EVACUATION FULGRATION CYSTOGRAM AND INSTILLATION OF FORMALIN (N/A) Subjective: Feels ok, fatigued and weak  Objective: Vital signs in last 24 hours: Temp:  [97.5 F (36.4 C)-98.2 F (36.8 C)] 97.9 F (36.6 C) (03/29 0758) Pulse Rate:  [70-80] 75 (03/29 0854) Cardiac Rhythm: Normal sinus rhythm (03/29 0400) Resp:  [11-28] 11 (03/29 0854) BP: (118-150)/(44-56) 122/50 (03/29 0800) SpO2:  [90 %-100 %] 93 % (03/29 0854) Weight:  [85.5 kg-87 kg] 85.5 kg (03/29 0156)  Hemodynamic parameters for last 24 hours:    Intake/Output from previous day: 03/28 0701 - 03/29 0700 In: 2705 [I.V.:60; Blood:315; NG/GT:1230; IV Piggyback:100] Out: 5400 [Urine:5400] Intake/Output this shift: Total I/O In: 150 [NG/GT:150] Out: -   General appearance: alert, cooperative, distracted and fatigued Heart: regular rate and rhythm Lungs: dim in lower fields Abdomen: benign Extremities: min edema Wound: incis healing well  Lab Results: Recent Labs    05/13/19 1630 05/14/19 0142  WBC 6.7 6.2  HGB 10.9* 10.9*  HCT 34.3* 34.3*  PLT 109* 115*   BMET:  Recent Labs    05/13/19 0344 05/14/19 0142  NA 134* 134*  K 5.6* 6.2*  CL 95* 95*  CO2 25 22  GLUCOSE 102* 168*  BUN 131* 162*  CREATININE 4.63* 5.54*  CALCIUM 7.9* 7.8*    PT/INR: No results for input(s): LABPROT, INR in the last 72 hours. ABG    Component Value Date/Time   PHART 7.369 05/10/2019 1804   HCO3 20.0 05/10/2019 1804   TCO2 21 (L) 05/10/2019 1804   ACIDBASEDEF 5.0 (H) 05/10/2019 1804   O2SAT 99.0 05/10/2019 1804   CBG (last 3)  Recent Labs    05/13/19 1553 05/13/19 2114 05/14/19 0636  GLUCAP 175* 181* 156*    Meds Scheduled Meds: . sodium chloride   Intravenous Once  . aspirin  81 mg Oral Daily  . calcium acetate (Phos Binder)  667 mg Per Tube TID WC  .  chlorhexidine  15 mL Mouth Rinse BID  . Chlorhexidine Gluconate Cloth  6 each Topical Daily  . epoetin alfa-epbx (RETACRIT) injection  3,000 Units Subcutaneous Q M,W,F-HD  . famotidine  10 mg Per Tube Daily  . feeding supplement (PRO-STAT SUGAR FREE 64)  60 mL Per Tube BID  . Gerhardt's butt cream   Topical BID  . heparin  4,000 Units Intravenous Once  . hydrocortisone   Rectal BID  . hydrocortisone  25 mg Rectal BID  . insulin aspart  0-15 Units Subcutaneous TID WC  . insulin aspart  0-5 Units Subcutaneous QHS  . magic mouthwash  5 mL Oral TID  . mouth rinse  15 mL Mouth Rinse q12n4p  . multivitamin  1 tablet Per Tube QHS  . nutrition supplement (JUVEN)  1 packet Per Tube BID BM  . patiromer  16.8 g Oral Daily  . [START ON 05/15/2019] predniSONE  20 mg Per Tube Q breakfast   Followed by  . [START ON 05/17/2019] predniSONE  10 mg Per Tube Q breakfast  . simethicone  80 mg Oral TID  . sodium chloride flush  10-40 mL Intracatheter Q12H  . sodium chloride flush  10-40 mL Intracatheter Q12H  . sodium chloride irrigation  3,000 mL Bladder Irrigation Q24H   Continuous Infusions: .  sodium chloride    . sodium chloride    . sodium chloride Stopped (05/08/19 1833)  . epinephrine Stopped (05/13/19 0800)  . feeding supplement (NEPRO CARB STEADY) 1,000 mL (05/13/19 1620)  . ferric gluconate (FERRLECIT/NULECIT) IV Stopped (05/11/19 1439)   PRN Meds:.sodium chloride, sodium chloride, oxyCODONE **AND** acetaminophen, dextrose, hydrOXYzine, hyoscyamine, levalbuterol, lidocaine (PF), lidocaine-prilocaine, loperamide HCl, ondansetron (ZOFRAN) IV, opium-belladonna, pentafluoroprop-tetrafluoroeth, promethazine, Resource ThickenUp Clear, sodium chloride flush, sodium chloride flush, sodium chloride flush  Xrays DG Chest 1 View  Result Date: 05/14/2019 CLINICAL DATA:  Atrial fibrillation with rapid ventricular response. Recent CABG. EXAM: CHEST  1 VIEW COMPARISON:  05/13/2019 and CT chest 04/20/2019.  FINDINGS: Feeding tube is followed into the stomach with the tip projecting beyond the inferior margin of the image. Right IJ central line tip is in the upper SVC. Left IJ catheter tip is in the SVC. Left chest tube terminates at the apex of the left hemithorax. No pneumothorax. Heart is stable in size. Thoracic aorta is calcified. There is diffuse mixed interstitial and airspace opacification with left lower lobe consolidation and bilateral pleural effusions. IMPRESSION: 1. Pulmonary edema and bilateral pleural effusions. 2. Left lower lobe collapse/consolidation. Pneumonia is not excluded. Electronically Signed   By: Lorin Picket M.D.   On: 05/14/2019 07:49   DG Chest 1 View  Result Date: 05/13/2019 CLINICAL DATA:  History of CABG procedure. EXAM: CHEST  1 VIEW COMPARISON:  Chest radiograph 05/11/2019. FINDINGS: Multiple monitoring leads overlie the patient. Enteric tube courses inferior to the diaphragm. Left IJ central venous catheter tip projects over the superior vena cava. Right IJ sheath projects over the superior vena cava. Stable enlarged cardiac and mediastinal contours. Small bilateral pleural effusions. Similar patchy mid lower lung airspace opacities. Left chest tube remains in place. No definite pneumothorax. IMPRESSION: Support apparatus as above. Small bilateral effusions and scattered airspace opacities. Electronically Signed   By: Lovey Newcomer M.D.   On: 05/13/2019 09:18   DG Cystogram  Result Date: 05/14/2019 CLINICAL DATA:  Report of bladder thrombus EXAM: CYSTOGRAM FROM OR TECHNIQUE: Contrast was instilled into the urinary bladder in a retrograde manner. FLUOROSCOPY TIME:  Fluoroscopy Time: 0 minutes 6 seconds Number of Acquired Spot Images:  2 COMPARISON:  None. FINDINGS: Limited filling of the bladder. There appears to be mild mucosal irregularity along the walls of the bladder. No filling defects are evident. No significant post drain residual. IMPRESSION: Question a degree of  cystitis. No filling defects seen on submitted images within the urinary bladder. No significant post drain residual. Electronically Signed   By: Lowella Grip III M.D.   On: 05/14/2019 09:17    Assessment/Plan: S/P Procedure(s) (LRB): CYSTOSCOPY CLOT EVACUATION FULGRATION CYSTOGRAM AND INSTILLATION OF FORMALIN (N/A)  1 events of weekend noted, foley is now out 2 for dialysis today,nephrology and AHF team managing volume issues, also has left pleurx 3 H/H remain stable 4 BS adeq control 5 Platelet count fairly stable 6 cont TF's 7 push rehab as able  LOS: 61 days    Jesse Giovanni PA-C Pager [phone] 05/14/2019  L pleurx drained today- down to 450cc HD today Walked 70 ft Bladder not symptomatic after Dr Marlan Palau treatment yesterday- Foley out Hope to move to 2 C this week  if bladder bleeding remains resolved  patient examined and medical record reviewed,agree with above note. Tharon Aquas Trigt III 05/14/2019

## 2019-05-14 NOTE — Progress Notes (Signed)
Patient ID: Jesse Murphy, male   DOB: 1928/04/03, 86 y.o.   MRN: [id number]    Advanced Heart Failure Rounding Note  PCP-Cardiologist: Kate Sable, MD   Subjective:    Events - Impella removed 2/23. Intraoperative TEE showed EF ~40% - Had tunneled cath placed 3/9 and left PleurX tube.  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 Emergent TVP placed - went to OR for repeat cystoscopy, fulgeration and formalin installation in bladder  Had repeat cystoscopy yesterday with fulgeration and formalin instillation. Foley out. Dripping clear urine. Bladder scan 160cc. Not c/o pain.   Denies SOB, orthopnea or PND   Objective:   Weight Range: 85.5 kg Body mass index is 28.67 kg/m.   Vital Signs:   Temp:  [97.5 F (36.4 C)-98.2 F (36.8 C)] 97.9 F (36.6 C) (03/29 0758) Pulse Rate:  [70-83] 83 (03/29 1000) Resp:  [11-28] 17 (03/29 1000) BP: (117-150)/(44-56) 120/56 (03/29 1000) SpO2:  [89 %-100 %] 89 % (03/29 1000) Weight:  [85.5 kg-87 kg] 85.5 kg (03/29 0156) Last BM Date: 05/13/19  Weight change: Filed Weights   05/13/19 0500 05/13/19 1229 05/14/19 0156  Weight: 87 kg 87 kg 85.5 kg    Intake/Output:   Intake/Output Summary (Last 24 hours) at 05/14/2019 1050 Last data filed at 05/14/2019 0900 Gross per 24 hour  Intake 1390 ml  Output 1450 ml  Net -60 ml     Physical Exam   General:  Lying in bed. Sleeping but arousable and follows commands. No resp difficulty HEENT: normal Neck: supple. JVP 7 Carotids 2+ bilat; no bruits. No lymphadenopathy or thryomegaly appreciated. Cor: PMI nondisplaced. Regular rate & rhythm. No rubs, gallops or murmurs. + tunneled cath Lungs: clear Abdomen: soft, nontender, nondistended. No hepatosplenomegaly. No bruits or masses. Good bowel sounds. Extremities: no  cyanosis, clubbing, rash, edema Neuro: alert & orientedx3, cranial nerves grossly intact. moves all 4 extremities w/o difficulty. Affect pleasant  Telemetry   Sinus 70-80s Personally reviewed   Labs    CBC Recent Labs    05/13/19 1630 05/14/19 0142  WBC 6.7 6.2  HGB 10.9* 10.9*  HCT 34.3* 34.3*  MCV 90.5 90.5  PLT 109* 409*   Basic Metabolic Panel Recent Labs    05/13/19 0344 05/14/19 0142  NA 134* 134*  K 5.6* 6.2*  CL 95* 95*  CO2 25 22  GLUCOSE 102* 168*  BUN 131* 162*  CREATININE 4.63* 5.54*  CALCIUM 7.9* 7.8*  MG 2.4 2.6*  PHOS 7.7* 9.2*   Liver Function Tests Recent Labs    05/12/19 2041 05/12/19 2041 05/13/19 0344 05/14/19 0142  AST 22  --   --   --   ALT 16  --   --   --   ALKPHOS 68  --   --   --   BILITOT 1.2  --   --   --   PROT 5.1*  --   --   --   ALBUMIN 2.6*   < > 2.5* 2.5*   < > = values in this interval not displayed.   No results for input(s): LIPASE, AMYLASE in the last 72 hours. Cardiac Enzymes No results for input(s): CKTOTAL, CKMB, CKMBINDEX, TROPONINI in the last 72 hours.  BNP: BNP (last 3 results) Recent Labs    03/22/19  0401 03/26/19 0626 03/27/19 1255  BNP 340.4* 687.5* 800.4*    ProBNP (last 3 results) No results for input(s): PROBNP in the last 8760 hours.   D-Dimer No results for input(s): DDIMER in the last 72 hours. Hemoglobin A1C No results for input(s): HGBA1C in the last 72 hours. Fasting Lipid Panel No results for input(s): CHOL, HDL, LDLCALC, TRIG, CHOLHDL, LDLDIRECT in the last 72 hours. Thyroid Function Tests No results for input(s): TSH, T4TOTAL, T3FREE, THYROIDAB in the last 72 hours.  Invalid input(s): FREET3  Other results:   Imaging    DG Chest 1 View  Result Date: 05/14/2019 CLINICAL DATA:  Atrial fibrillation with rapid ventricular response. Recent CABG. EXAM: CHEST  1 VIEW COMPARISON:  05/13/2019 and CT chest 04/20/2019. FINDINGS: Feeding tube is followed into the stomach with the  tip projecting beyond the inferior margin of the image. Right IJ central line tip is in the upper SVC. Left IJ catheter tip is in the SVC. Left chest tube terminates at the apex of the left hemithorax. No pneumothorax. Heart is stable in size. Thoracic aorta is calcified. There is diffuse mixed interstitial and airspace opacification with left lower lobe consolidation and bilateral pleural effusions. IMPRESSION: 1. Pulmonary edema and bilateral pleural effusions. 2. Left lower lobe collapse/consolidation. Pneumonia is not excluded. Electronically Signed   By: Lorin Picket M.D.   On: 05/14/2019 07:49   DG Cystogram  Result Date: 05/14/2019 CLINICAL DATA:  Report of bladder thrombus EXAM: CYSTOGRAM FROM OR TECHNIQUE: Contrast was instilled into the urinary bladder in a retrograde manner. FLUOROSCOPY TIME:  Fluoroscopy Time: 0 minutes 6 seconds Number of Acquired Spot Images:  2 COMPARISON:  None. FINDINGS: Limited filling of the bladder. There appears to be mild mucosal irregularity along the walls of the bladder. No filling defects are evident. No significant post drain residual. IMPRESSION: Question a degree of cystitis. No filling defects seen on submitted images within the urinary bladder. No significant post drain residual. Electronically Signed   By: Lowella Grip III M.D.   On: 05/14/2019 09:17     Medications:     Scheduled Medications: . sodium chloride   Intravenous Once  . aspirin  81 mg Oral Daily  . calcium acetate (Phos Binder)  667 mg Per Tube TID WC  . chlorhexidine  15 mL Mouth Rinse BID  . Chlorhexidine Gluconate Cloth  6 each Topical Daily  . epoetin alfa-epbx (RETACRIT) injection  3,000 Units Subcutaneous Q M,W,F-HD  . famotidine  10 mg Per Tube Daily  . feeding supplement (PRO-STAT SUGAR FREE 64)  60 mL Per Tube BID  . Gerhardt's butt cream   Topical BID  . heparin  4,000 Units Intravenous Once  . hydrocortisone   Rectal BID  . hydrocortisone  25 mg Rectal BID  .  insulin aspart  0-15 Units Subcutaneous TID WC  . insulin aspart  0-5 Units Subcutaneous QHS  . magic mouthwash  5 mL Oral TID  . mouth rinse  15 mL Mouth Rinse q12n4p  . multivitamin  1 tablet Per Tube QHS  . nutrition supplement (JUVEN)  1 packet Per Tube BID BM  . patiromer  16.8 g Oral Daily  . [START ON 05/15/2019] predniSONE  20 mg Per Tube Q breakfast   Followed by  . [START ON 05/17/2019] predniSONE  10 mg Per Tube Q breakfast  . simethicone  80 mg Oral TID  . sodium chloride flush  10-40 mL Intracatheter Q12H  . sodium chloride flush  10-40 mL Intracatheter Q12H  . sodium chloride irrigation  3,000 mL Bladder Irrigation Q24H    Infusions: . sodium chloride    . sodium chloride    . sodium chloride Stopped (05/08/19 1833)  . epinephrine Stopped (05/13/19 0800)  . feeding supplement (NEPRO CARB STEADY) 1,000 mL (05/13/19 1620)  . ferric gluconate (FERRLECIT/NULECIT) IV Stopped (05/11/19 1439)    PRN Medications: sodium chloride, sodium chloride, oxyCODONE **AND** acetaminophen, dextrose, hydrOXYzine, hyoscyamine, levalbuterol, lidocaine (PF), lidocaine-prilocaine, loperamide HCl, ondansetron (ZOFRAN) IV, opium-belladonna, pentafluoroprop-tetrafluoroeth, promethazine, Resource ThickenUp Clear, sodium chloride flush, sodium chloride flush, sodium chloride flush    Assessment/Plan   1. Acute systolic HF -> Cardiogenic shock - post-op echo on 03/20/19 EF 25-30% (pre-op 30-35%. In 12/2017 EF normal) - Required Impella support post CABG. Impella removed 2/23. Post-op TEE EF 40% - Has developed ESRD - Volume status well maintained with iHD on MWF Schedule. Now off midodrine due to rash (unclear which medication caused). BP stable - unable to tolerate GDMT with low BP and ESRD - On MWF schedule. For iHD today  2. CAD s/p CABG this admit (LIMA->LAD, SVG->OM, SVG->RCA on 03/15/19) - No s/s ischemia - Continue statin.  - Off ASS due to hematuria and BRBPR  3. Acute hypoxic  respiratory failure with Pseudomonas PNA - s/p trach on 03/27/19 - completed meropenem for pseudomonas PNA - trach aspirate 3/5 w/ regrowth of Pseudomonas again.   - Completed antibiotic course.  -Trach decannulated 3/13. Stable on [place]mo - Stable no change   4. PAF - s/p MAZE and LAA occlusion - Remains in NSR. Off amio due to rash (unclear which agent it was) - No AC with severe hematuria  5. AKI -> ESRD - due to shock/ATN.  - Tunneled cath placed 3/9  - He is tolerating iHD. Midodrine now off.  - Vein mapping complete. Nephrology considering permanent access. Will f/u with them,   6. Hematuria - Remains a major issue for him.  Remains on CBI with frequent need for irrigation due to clots -Cystoscopy 3/15 with fulgeration - off anticoagulation. - Urology started aluminum irrigation and trial of carbiprost - Back to OR 3/28 with fulgeration and formalin installation - Bleeding seems to have slowed. Foley out - Appreciate Urology input  7. Complete heart block with profound symptomatic bradycardia - likely vagal in nature - s/p emergent TVP 3/25 - resolved TVP out.   8. Debility, severe - continue PT/OT  9. F/E/N - Tube feeds per Cor-trak.     10. Left Pleural Effusion  - moderate to large. - s/p PleurX on 3/8.  - TCTS managing. Draining M/Th  10. Diffuse rash - Appearance of drug induced rash. - Steroids started 3/21 -> much improved - Now tapering steroids. Discussed dosing with PharmD personally. - Can add needed meds back 1 by 1 watching for recurrent rash  11. Anemia: - h/o melena and hematuria this admit. Now with copious BRBPR - monitor. transfuse if < 7.5 - hgb 9.0 -> 7.9 -> 10.9  12. Dysphagia and BRBPR - ? Possible esophagitis +/- radiation proctitis - GI following.Treated with IV fluconazole. Resolved  13. Hyperkalemia - K 6.2 today - HD MWF. Will get today - lokelma as needed on non HD days. - Renal following  Glori Bickers, MD  10:50 AM

## 2019-05-15 ENCOUNTER — Inpatient Hospital Stay (HOSPITAL_COMMUNITY): Payer: Medicare Other

## 2019-05-15 LAB — GLUCOSE, CAPILLARY
Glucose-Capillary: 120 mg/dL — ABNORMAL HIGH (ref 70–99)
Glucose-Capillary: 131 mg/dL — ABNORMAL HIGH (ref 70–99)
Glucose-Capillary: 113 mg/dL — ABNORMAL HIGH (ref 70–99)
Glucose-Capillary: 152 mg/dL — ABNORMAL HIGH (ref 70–99)
Glucose-Capillary: 151 mg/dL — ABNORMAL HIGH (ref 70–99)

## 2019-05-15 LAB — RENAL FUNCTION PANEL
Albumin: 2.2 g/dL — ABNORMAL LOW (ref 3.5–5.0)
Anion gap: 12 (ref 5–15)
BUN: 104 mg/dL — ABNORMAL HIGH (ref 8–23)
CO2: 26 mmol/L (ref 22–32)
Calcium: 7.9 mg/dL — ABNORMAL LOW (ref 8.9–10.3)
Chloride: 102 mmol/L (ref 98–111)
Creatinine, Ser: 4.05 mg/dL — ABNORMAL HIGH (ref 0.61–1.24)
GFR calc Af Amer: 14 mL/min — ABNORMAL LOW (ref >60)
GFR calc non Af Amer: 12 mL/min — ABNORMAL LOW (ref >60)
Glucose, Bld: 130 mg/dL — ABNORMAL HIGH (ref 70–99)
Phosphorus: 6.2 mg/dL — ABNORMAL HIGH (ref 2.5–4.6)
Potassium: 4.7 mmol/L (ref 3.5–5.1)
Sodium: 140 mmol/L (ref 135–145)

## 2019-05-15 LAB — CBC
HCT: 36.4 % — ABNORMAL LOW (ref 39.0–52.0)
Hemoglobin: 11 g/dL — ABNORMAL LOW (ref 13.0–17.0)
MCH: 28.4 pg (ref 26.0–34.0)
MCHC: 30.2 g/dL (ref 30.0–36.0)
MCV: 94.1 fL (ref 80.0–100.0)
Platelets: 86 10*3/uL — ABNORMAL LOW (ref 150–400)
RBC: 3.87 MIL/uL — ABNORMAL LOW (ref 4.22–5.81)
RDW: 20.8 % — ABNORMAL HIGH (ref 11.5–15.5)
WBC: 10 10*3/uL (ref 4.0–10.5)
nRBC: 0 % (ref 0.0–0.2)

## 2019-05-15 LAB — MAGNESIUM: Magnesium: 2.2 mg/dL (ref 1.7–2.4)

## 2019-05-15 MED ORDER — PRO-STAT SUGAR FREE PO LIQD
60.0000 mL | Freq: Three times a day (TID) | ORAL | Status: DC
  Administered 2019-05-15 – 2019-05-21 (×17): 60 mL
  Filled 2019-05-15 (×19): qty 60

## 2019-05-15 MED ORDER — NEPRO/CARBSTEADY PO LIQD
237.0000 mL | Freq: Four times a day (QID) | ORAL | Status: DC
  Administered 2019-05-15 – 2019-05-20 (×9): 237 mL

## 2019-05-15 MED ORDER — CHLORHEXIDINE GLUCONATE CLOTH 2 % EX PADS
6.0000 | MEDICATED_PAD | Freq: Every day | CUTANEOUS | Status: DC
  Administered 2019-05-15 – 2019-05-17 (×3): 6 via TOPICAL

## 2019-05-15 NOTE — Significant Event (Signed)
[name] was clogged over nightshift. Able to unclogged it this morning and tube feeds restarted at 60cc/hour.     Jesse Murphy

## 2019-05-15 NOTE — Progress Notes (Addendum)
Nutrition Follow up   DOCUMENTATION CODES:   Not applicable  INTERVENTION:  Pt day 56 with [name]. Poor candidate for PEG per TCTS.   Trial bolus to promote intake:  -1 carton Nepro four times daily  -60 ml Prostat TID  Provides:2280kcals, 166 grams protein, 650ml free water.  NUTRITION DIAGNOSIS:   Increased nutrient needs related to post-op healing as evidenced by estimated needs.  Ongoing  GOAL:   Patient will meet greater than or equal to 90% of their needs   Addressed via tube feeding   MONITOR:   Weight trends, Diet advancement, Vent status, Skin, TF tolerance, Labs, I & O's  REASON FOR ASSESSMENT:   Consult Enteral/tube feeding initiation and management  ASSESSMENT:   Patient with PMH significant for CAD s/p PCI, prostate cancer, HTN, CHF, and DM. Presents this admission with chronic a.fib with RVR.   1/28- s/p L heart cath  1/30- s/p CABG x3, MAZE, extubated  2/2- re-intubated  2/5- extubated 2/8- re-intubated  2/10- s/p impella, trach, NGT placed-start trickle feeding 2/11- start CRRT 2/23- removal of impella, [name] dislodged  2/24- CRRT stop 3/1- failed iHD, back on CRRT 3/2- s/p [name], nocturnal feedings started 3/4- diet advanced DYS2 , nectar thick  3/8- s/p L PleurX catheter and L TDC 3/10- tolerated iHD 3/13- s/p decannulation  3/15- s/p cystoscopy  3/17- [name] clogged, s/p replacement   Pt discussed during ICU rounds and with RN.   [name] clogged this am but is now working. Tube feeding changed by MD on 2/28. Tolerating Nepro @ 60 ml/hr. Discussed possible PEG placement with MD. Pt is poor candidate. Plan to try bolus feedings again in hopes to stimulate appetite.   Diet advanced to DYS 2 with thin liquids. Intake remains inadequate (0-25%). Pt does not like oral nutrition supplements. May need to have another goals of care discussion if intake does not progress as a [name] is not a long term solution.   Admission weight:  89.3 kg  Current weight: 84.2 kg   Last HD yesterday: 496 ml net UF  Medications: phoslyra, SS novolog, rena-vit, prednisone Labs: CBG 113-193 Phosphorus 6.2 (H)   Diet Order:   Diet Order            DIET DYS 2 Room service appropriate? No; Fluid consistency: Thin  Diet effective now              EDUCATION NEEDS:   Not appropriate for education at this time  Skin:  Skin Assessment: Skin Integrity Issues: Skin Integrity Issues:: Stage I, DTI, Stage II DTI: chest, R heel Stage I: vertebral column Stage II: buttocks, L ear Incisions: chest, leg  Last BM:  3/29  Height:   Ht Readings from Last 1 Encounters:  05/13/19 5' 7.99" (1.727 m)    Weight:   Wt Readings from Last 1 Encounters:  05/15/19 84.2 kg    Ideal Body Weight:  70 kg  BMI:  Body mass index is 28.23 kg/m.  Estimated Nutritional Needs:   Kcal:  2200-2400 kcal  Protein:  135-155 grams  Fluid:  >/= 2.2 L/day   Mariana Single RD, LDN Clinical Nutrition Pager # - [phone]

## 2019-05-15 NOTE — Plan of Care (Signed)
  Problem: Clinical Measurements: Goal: Ability to maintain clinical measurements within normal limits will improve Outcome: Progressing Goal: Will remain free from infection Outcome: Progressing Goal: Respiratory complications will improve Outcome: Progressing Goal: Cardiovascular complication will be avoided Outcome: Progressing   

## 2019-05-15 NOTE — Progress Notes (Signed)
Patient ID: Jesse Murphy, male   DOB: 1928/10/11, 86 y.o.   MRN: [id number]    Advanced Heart Failure Rounding Note  PCP-Cardiologist: Kate Sable, MD   Subjective:    Events - Impella removed 2/23. Intraoperative TEE showed EF ~40% - Had tunneled cath placed 3/9 and left PleurX tube.  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 Emergent TVP placed - went to OR for repeat cystoscopy, fulgeration and formalin installation in bladder  Foley out. Still with some clear dribbling from bladder but no blood.   Denies CP or SOB. Stood up today but very weak. Unable to walk. Knees gave out.   Poor po intake.   Remains in NSR. Off amio. Weaning steroids.     Objective:   Weight Range: 84.2 kg Body mass index is 28.23 kg/m.   Vital Signs:   Temp:  [96.8 F (36 C)-98.8 F (37.1 C)] 97.7 F (36.5 C) (03/30 1223) Pulse Rate:  [73-87] 76 (03/30 1300) Resp:  [11-28] 19 (03/30 1300) BP: (85-127)/(40-79) 110/51 (03/30 1300) SpO2:  [87 %-98 %] 97 % (03/30 1300) Weight:  [84.2 kg-84.5 kg] 84.2 kg (03/30 0500) Last BM Date: 05/14/19  Weight change: Filed Weights   05/14/19 1310 05/14/19 1715 05/15/19 0500  Weight: 85.7 kg 84.5 kg 84.2 kg    Intake/Output:   Intake/Output Summary (Last 24 hours) at 05/15/2019 1402 Last data filed at 05/15/2019 1300 Gross per 24 hour  Intake 1385 ml  Output 496 ml  Net 889 ml     Physical Exam   General:  Weak appearing. Elderly. Lying in bed  No resp difficulty HEENT: normal Neck: supple. no JVD. Trach site healing well.  Carotids 2+ bilat; no bruits. No lymphadenopathy or thryomegaly appreciated. Cor: PMI nondisplaced. Regular rate & rhythm. No rubs, gallops or murmurs. +LIJ tunneled cath Lungs: clear mildly decreased left base Abdomen: soft, nontender,  nondistended. No hepatosplenomegaly. No bruits or masses. Good bowel sounds. Extremities: no cyanosis, clubbing, rash, tr edema Neuro: alert & orientedx3, cranial nerves grossly intact. moves all 4 extremities w/o difficulty. Affect pleasant   Telemetry   Sinus 70-80s  Personally reviewed   Labs    CBC Recent Labs    05/14/19 0142 05/15/19 0526  WBC 6.2 10.0  HGB 10.9* 11.0*  HCT 34.3* 36.4*  MCV 90.5 94.1  PLT 115* 86*   Basic Metabolic Panel Recent Labs    05/14/19 0142 05/14/19 0142 05/14/19 1417 05/15/19 0526  NA 134*  --   --  140  K 6.2*   < > 3.4* 4.7  CL 95*  --   --  102  CO2 22  --   --  26  GLUCOSE 168*  --   --  130*  BUN 162*  --   --  104*  CREATININE 5.54*  --   --  4.05*  CALCIUM 7.8*  --   --  7.9*  MG 2.6*  --   --  2.2  PHOS 9.2*  --   --  6.2*   < > = values in this interval not displayed.   Liver Function Tests Recent Labs    05/12/19 2041 05/13/19 0344 05/14/19 0142 05/15/19 0526  AST 22  --   --   --   ALT 16  --   --   --  ALKPHOS 68  --   --   --   BILITOT 1.2  --   --   --   PROT 5.1*  --   --   --   ALBUMIN 2.6*   < > 2.5* 2.2*   < > = values in this interval not displayed.   No results for input(s): LIPASE, AMYLASE in the last 72 hours. Cardiac Enzymes No results for input(s): CKTOTAL, CKMB, CKMBINDEX, TROPONINI in the last 72 hours.  BNP: BNP (last 3 results) Recent Labs    03/22/19 0401 03/26/19 0626 03/27/19 1255  BNP 340.4* 687.5* 800.4*    ProBNP (last 3 results) No results for input(s): PROBNP in the last 8760 hours.   D-Dimer No results for input(s): DDIMER in the last 72 hours. Hemoglobin A1C No results for input(s): HGBA1C in the last 72 hours. Fasting Lipid Panel No results for input(s): CHOL, HDL, LDLCALC, TRIG, CHOLHDL, LDLDIRECT in the last 72 hours. Thyroid Function Tests No results for input(s): TSH, T4TOTAL, T3FREE, THYROIDAB in the last 72 hours.  Invalid input(s): FREET3  Other  results:   Imaging    No results found.   Medications:     Scheduled Medications: . sodium chloride   Intravenous Once  . aspirin  81 mg Oral Daily  . calcium acetate (Phos Binder)  667 mg Per Tube TID WC  . chlorhexidine  15 mL Mouth Rinse BID  . Chlorhexidine Gluconate Cloth  6 each Topical Daily  . Chlorhexidine Gluconate Cloth  6 each Topical Q0600  . epoetin alfa-epbx (RETACRIT) injection  3,000 Units Subcutaneous Q M,W,F-HD  . famotidine  10 mg Per Tube Daily  . feeding supplement (PRO-STAT SUGAR FREE 64)  60 mL Per Tube BID  . Gerhardt's butt cream   Topical BID  . heparin  4,000 Units Intravenous Once  . hydrocortisone   Rectal BID  . hydrocortisone  25 mg Rectal BID  . insulin aspart  0-15 Units Subcutaneous TID WC  . insulin aspart  0-5 Units Subcutaneous QHS  . magic mouthwash  5 mL Oral TID  . mouth rinse  15 mL Mouth Rinse q12n4p  . multivitamin  1 tablet Per Tube QHS  . nutrition supplement (JUVEN)  1 packet Per Tube BID BM  . patiromer  16.8 g Oral Daily  . predniSONE  20 mg Per Tube Q breakfast   Followed by  . [START ON 05/17/2019] predniSONE  10 mg Per Tube Q breakfast  . simethicone  80 mg Oral TID  . sodium chloride flush  10-40 mL Intracatheter Q12H  . sodium chloride flush  10-40 mL Intracatheter Q12H  . sodium chloride irrigation  3,000 mL Bladder Irrigation Q24H    Infusions: . sodium chloride    . sodium chloride    . sodium chloride Stopped (05/08/19 1833)  . epinephrine Stopped (05/13/19 0800)  . feeding supplement (NEPRO CARB STEADY) 60 mL/hr at 05/15/19 0715    PRN Medications: sodium chloride, sodium chloride, oxyCODONE **AND** acetaminophen, dextrose, heparin, hydrOXYzine, hyoscyamine, levalbuterol, lidocaine (PF), lidocaine-prilocaine, loperamide HCl, ondansetron (ZOFRAN) IV, opium-belladonna, pentafluoroprop-tetrafluoroeth, promethazine, Resource ThickenUp Clear, sodium chloride flush, sodium chloride flush, sodium chloride flush     Assessment/Plan   1. Acute systolic HF -> Cardiogenic shock - post-op echo on 03/20/19 EF 25-30% (pre-op 30-35%. In 12/2017 EF normal) - Required Impella support post CABG. Impella removed 2/23. Post-op TEE EF 40% - Has developed ESRD - Volume status well maintained with iHD on MWF Schedule. Now off  midodrine due to rash (unclear which medication caused). BP stable - unable to tolerate GDMT with low BP and ESRD - On MWF schedule. For iHD tomorrow   2. CAD s/p CABG this admit (LIMA->LAD, SVG->OM, SVG->RCA on 03/15/19) - No s/sischemia  - Continue statin.  - Off ASS due to hematuria and BRBPR  3. Acute hypoxic respiratory failure with Pseudomonas PNA - s/p trach on 03/27/19 - completed meropenem for pseudomonas PNA - trach aspirate 3/5 w/ regrowth of Pseudomonas again.   - Completed antibiotic course.  -Trach decannulated 3/13. Stable on [place] - Stable no change   4. PAF - s/p MAZE and LAA occlusion - Remains in NSR. Off amio due to rash (unclear which agent it was) - Can reconsider AC in near future as bladder issues stabilize  5. AKI -> ESRD - due to shock/ATN.  - Tunneled cath placed 3/9  - He is tolerating iHD. Midodrine now off.  - Vein mapping complete. Nephrology considering permanent access but no formal plans   6. Hematuria -Cystoscopy 3/15 with fulgeration - Back to OR 3/28 with fulgeration and formalin installation - Seems to have resolved. Follow   7. Complete heart block with profound symptomatic bradycardia - likely vagal in nature - s/p emergent TVP 3/25 - resolved TVP out.   8. Debility, severe - continue PT/OT  9. F/E/N - Poor po intake.  - Cor-trak clogged. Ned to fix  - Encourage po      10. Left Pleural Effusion  - moderate to large. - s/p PleurX on 3/8.  - TCTS managing. Draining M/Th  10. Diffuse rash - Appearance of drug induced rash. - Steroids started 3/21 -> much improved - Now tapering steroids. Will decrease to prednisone 10 mg on 4/1 -  Can add needed meds back 1 by 1 watching for recurrent rash  11. Anemia: - h/o melena and hematuria this admit. Now with copious BRBPR - monitor. transfuse if < 7.5 - hgb 9.0 -> 7.9 -> 10.9  12. Dysphagia and BRBPR - ? Possible esophagitis +/- radiation proctitis - GI following.Treated with IV fluconazole. Resolved  13. Hyperkalemia - K 4.7 today - lokelma as needed on non HD days. - Renal following  Glori Bickers, MD  2:02 PM

## 2019-05-15 NOTE — Progress Notes (Signed)
Patient has been accepted at [facility] with understanding that CIR is still the plan for patient to get stronger before discharge. Renal Navigator will follow up closer to discharge to get seat schedule.  Alphonzo Cruise, [place] Renal Navigator [phone]

## 2019-05-15 NOTE — Progress Notes (Signed)
Objective Swallowing Evaluation: Type of Study: MBS-Modified Barium Swallow Study   Patient Details  Name: Jesse Murphy MRN: [id number] Date of Birth: 01/24/1929  Today's Date: 05/15/2019 Time: SLP Start Time (ACUTE ONLY): 6767 -SLP Stop Time (ACUTE ONLY): 1300  SLP Time Calculation (min) (ACUTE ONLY): 15 min   Past Medical History:  Past Medical History:  Diagnosis Date  . Atrial fibrillation ([place])   . CAD (coronary artery disease)    a. s/p PCI in 1992 b. Coronary CT in 01/2018 showing extensive coronary calcification; FFR indeterminate --> medical management pursued at that time as patient not interested in repeat cath  . Cancer [facility])    prostate  . Dyspnea   . Hypertension    Past Surgical History:  Past Surgical History:  Procedure Laterality Date  . CHEST TUBE INSERTION Left 04/23/2019   Procedure: INSERTION PLEURAL DRAINAGE CATHETER TO DRAIN LEFT PLEURAL EFFUSION;  Surgeon: Ivin Poot, MD;  Location: [place];  Service: Thoracic;  Laterality: Left;  . CLIPPING OF ATRIAL APPENDAGE N/A 03/16/2019   Procedure: CLIPPING OF LEFT  ATRIAL APPENDAGE - USING ATRICLIP SIZE 40;  Surgeon: Ivin Poot, MD;  Location: [place];  Service: Open Heart Surgery;  Laterality: N/A;  . COLONOSCOPY N/A 08/25/2017   Procedure: COLONOSCOPY;  Surgeon: Rogene Houston, MD;  [facility];  Service: Endoscopy;  Laterality: N/A;  . CORONARY ARTERY BYPASS GRAFT N/A 03/16/2019   Procedure: CORONARY ARTERY BYPASS GRAFTING (CABG), ON PUMP, TIMES THREE, USING LEFT INTERNAL MAMMARY ARTERY, RIGHT GREATER SAPHENOUS VEIN HARVESTED ENDOSCOPICALLY;  Surgeon: Ivin Poot, MD;  Location: [place];  Service: Open Heart Surgery;  Laterality: N/A;  swan only  . CYSTOSCOPY WITH FULGERATION N/A 04/30/2019   Procedure: CYSTOSCOPY WITH FULGERATION;  Surgeon: Irine Seal, MD;  Location: [place];  Service: Urology;  Laterality: N/A;  . HERNIA REPAIR    . IR FLUORO GUIDE CV LINE LEFT  04/23/2019  . IR US GUIDE VASC  ACCESS LEFT  04/23/2019  . MAZE N/A 03/16/2019   Procedure: MAZE;  Surgeon: Ivin Poot, MD;  Location: [place];  Service: Open Heart Surgery;  Laterality: N/A;  . PLACEMENT OF IMPELLA LEFT VENTRICULAR ASSIST DEVICE Right 03/27/2019   Procedure: Placement Of Impella Left Ventricular Assist Device using ABIOMED Impella 5.5 with SmartAssist Device.;  Surgeon: Wonda Olds, MD;  Location[facility];  Service: Thoracic;  Laterality: Right;  . PROSTATE SURGERY    . REMOVAL OF IMPELLA LEFT VENTRICULAR ASSIST DEVICE N/A 04/10/2019   Procedure: REMOVAL OF IMPELLA LEFT VENTRICULAR ASSIST DEVICE WITH INSERTION OF RIGHT FEMORAL ARTERIAL LINE;  Surgeon: Ivin Poot, MD;  Location: [place];  Service: Open Heart Surgery;  Laterality: N/A;  . RIGHT/LEFT HEART CATH AND CORONARY ANGIOGRAPHY N/A 03/15/2019   Procedure: RIGHT/LEFT HEART CATH AND CORONARY ANGIOGRAPHY;  Surgeon: Burnell Blanks, MD;  Location: [place] [facility];  Service: Cardiovascular;  Laterality: N/A;  . TEE WITHOUT CARDIOVERSION N/A 03/16/2019   Procedure: TRANSESOPHAGEAL ECHOCARDIOGRAM (TEE);  Surgeon: Prescott Gum, Collier Salina, MD;  Location: [place];  Service: Open Heart Surgery;  Laterality: N/A;  . TRACHEOSTOMY TUBE PLACEMENT N/A 03/27/2019   Procedure: TRACHEOSTOMY placed using [place]iley 8DCT Cuffed.;  Surgeon: Wonda Olds, MD;  Location: [facility];  Service: Thoracic;  Laterality: N/A;  . TRANSURETHRAL RESECTION OF BLADDER NECK N/A 05/13/2019   Procedure: CYSTOSCOPY CLOT EVACUATION FULGRATION CYSTOGRAM AND INSTILLATION OF FORMALIN;  Surgeon: Lucas Mallow, MD;  Location: [place];  Service: Urology;  Laterality: N/A;   HPI: 86 y.o. male with PMH: h/o CAD s/p CABG and clipping of left atrial appendage, who presented with SOB on 2/1. He became hypoxic on 2/2 with pulmonary edema and needed to be intubated on 2/2 and extubated 2/5. Reintubated 2/8.  Pt underwent impella placement and tracheostomy on 2/9. Pt started on CRRT 2/11. Impella removal  2/23. Pt decannulated 3/13.  MBS 3/16 with penetration of nectar to the vocal folds without sensation.     Subjective: pleasant, a bit sleepy    Assessment / Plan / Recommendation  CHL IP CLINICAL IMPRESSIONS 05/15/2019  Clinical Impression Pt presents with improved oropharyngeal swallow function marked by functional oral control and better base-of-tongue propulsion, much less residue in pharynx post-swallow with better sensation of its presence. Pt continues to have delays in onset of laryngeal vestibule closure - this led, on one occasion, to moderate aspiration of thin liquids during the swallow.  When pt tucked his chin, duration of laryngeal vestibule closure was longer, allowing thin liquids to pass through pharynx and into UES without penetration nor aspiration.  This posture was repeated multiple times to ensure success.  Recommend advancing diet to dysphagia 2, thin liquids - pt may use straw to allow him to tuck chin in advance of sipping drinks.  RN present for study; results/recs reviewed.     SLP Visit Diagnosis Dysphagia, oropharyngeal phase (R13.12)  Attention and concentration deficit following --  Frontal lobe and executive function deficit following --  Impact on safety and function Mild aspiration risk      CHL IP TREATMENT RECOMMENDATION 05/15/2019  Treatment Recommendations Therapy as outlined in treatment plan below     Prognosis 05/15/2019  Prognosis for Safe Diet Advancement Good  Barriers to Reach Goals --  Barriers/Prognosis Comment --    CHL IP DIET RECOMMENDATION 05/15/2019  SLP Diet Recommendations Dysphagia 2 (Fine chop) solids;Thin liquid  Liquid Administration via Straw;Cup  Medication Administration Whole meds with puree  Compensations Chin tuck;Slow rate  Postural Changes --      CHL IP OTHER RECOMMENDATIONS 05/15/2019  Recommended Consults --  Oral Care Recommendations Oral care BID  Other Recommendations --      CHL IP FOLLOW UP RECOMMENDATIONS  05/15/2019  Follow up Recommendations Skilled Nursing facility      [facility] IP FREQUENCY AND DURATION 05/15/2019  Speech Therapy Frequency (ACUTE ONLY) min 3x week  Treatment Duration 2 weeks               Shalanda Brogden L. Tivis Ringer, [place] CCC/SLP Acute Rehabilitation Services Office number [phone] Pager [phone]   Juan Quam Laurice 05/15/2019, 2:46 PM    .

## 2019-05-15 NOTE — Significant Event (Signed)
Patient transferred safely to 2C07, taken via bed by RN and NT. VS stable prior and during the transfer. Report given to receiving RN Suezanne Jacquet. No personal belongings at bedside of Jesse Murphy. All hospital acquired items (toiletry, prevalon booth, SCDs, etc) taken to new room. Patient's son at bedside. Patient settled in bed prior to RN leaving unit.     Jesse Murphy

## 2019-05-15 NOTE — Progress Notes (Signed)
[facility] ROUNDING NOTE   Subjective:   This is an 86 year old gentleman with a fairly long hospitalization developed acute systolic heart failure cardiogenic shock requiring Impella support post CABG postop ejection fraction 40% Impella removed 04/10/2019.  Status post CABG this admit.  History of paroxysmal atrial fibrillation status post maze LAA occlusion.  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 Gloriann Loan.  Now dialysis dependent since 04/08/2019.  Tolerated dialysis 05/14/2019.  Cannot see quantity of ultrafiltration from medical records.  Will check with dialysis staff  Blood pressure 114/44 pulse 82 temperature 98.8 O2 sats 90%  2 L nasal cannula  Sodium 140 potassium 4.7 chloride 102 CO2 26 BUN 104 creatinine 4.04 glucose 130 calcium 7.9 phosphorus 6.2 albumin 2.2 hemoglobin 11.0 platelets 86  Aspirin 81 mg daily Phoslyra 660 mg 3 times daily, Retacrit 3000 units Monday Wednesday Friday Pepcid 10 mg daily Veltassa 16.8 g administered 05/14/2019, Deltasone 20 mg daily, IV iron 125 mg Monday Wednesday Friday  Blood pressure 125/50 pulse 74 temperature 98.2 O2 sats 91% 2 L nasal cannula  Objective:  Vital signs in last 24 hours:  Temp:  [96.8 F (36 C)-98.8 F (37.1 C)] 98.8 F (37.1 C) (03/30 0400) Pulse Rate:  [72-87] 81 (03/30 0700) Resp:  [9-28] 15 (03/30 0700) BP: (72-128)/(40-79) 114/44 (03/30 0700) SpO2:  [87 %-98 %] 91 % (03/30 0700) Weight:  [84.2 kg-85.7 kg] 84.2 kg (03/30 0500)  Weight change: -1.3 kg Filed Weights   05/14/19 1310 05/14/19 1715 05/15/19 0500  Weight: 85.7 kg 84.5 kg 84.2 kg    Intake/Output: I/O last 3 completed shifts: In: 2360 [NG/GT:1980; IV Piggyback:50] Out: 971 [Urine:25; Other:496; Chest Tube:450]   Intake/Output this shift:  No intake/output data recorded.  Gen: Comfortably resting in bed, being cleaned up by nurse. CVS: Pulse regular rhythm, normal rate,  S1 and S2 normal Resp: Anteriorly clear to auscultation, no rales.  Left IJ TDC Abd: Soft, obese, nontender Ext: Trace dependent edema.   Basic Metabolic Panel: Recent Labs  Lab 05/11/19 0330 05/11/19 0330 05/12/19 0309 05/12/19 0309 05/12/19 2041 05/12/19 2041 05/13/19 0344 05/14/19 0142 05/14/19 1417 05/15/19 0526  NA 136   < > 135  --  136  --  134* 134*  --  140  K 6.5*   < > 5.3*   < > 5.9*  --  5.6* 6.2* 3.4* 4.7  CL 97*   < > 97*  --  97*  --  95* 95*  --  102  CO2 26   < > 27  --  24  --  25 22  --  26  GLUCOSE 172*   < > 161*  --  190*  --  102* 168*  --  130*  BUN 112*   < > 77*  --  118*  --  131* 162*  --  104*  CREATININE 4.22*   < > 3.13*  --  4.27*  --  4.63* 5.54*  --  4.05*  CALCIUM 8.6*   < > 8.1*   < > 8.2*   < > 7.9* 7.8*  --  7.9*  MG 2.5*  --  2.1  --   --   --  2.4 2.6*  --  2.2  PHOS 6.3*  --  7.1*  --   --   --  7.7* 9.2*  --  6.2*   < > = values in this interval not displayed.  Liver Function Tests: Recent Labs  Lab 05/12/19 0309 05/12/19 2041 05/13/19 0344 05/14/19 0142 05/15/19 0526  AST  --  22  --   --   --   ALT  --  16  --   --   --   ALKPHOS  --  68  --   --   --   BILITOT  --  1.2  --   --   --   PROT  --  5.1*  --   --   --   ALBUMIN 2.5* 2.6* 2.5* 2.5* 2.2*   No results for input(s): LIPASE, AMYLASE in the last 168 hours. No results for input(s): AMMONIA in the last 168 hours.  CBC: Recent Labs  Lab 05/12/19 2041 05/13/19 0344 05/13/19 1630 05/14/19 0142 05/15/19 0526  WBC 8.0 6.1 6.7 6.2 10.0  HGB 9.0* 7.9* 10.9* 10.9* 11.0*  HCT 29.0* 25.9* 34.3* 34.3* 36.4*  MCV 91.5 92.2 90.5 90.5 94.1  PLT 143* 116* 109* 115* 86*    Cardiac Enzymes: No results for input(s): CKTOTAL, CKMB, CKMBINDEX, TROPONINI in the last 168 hours.  BNP: Invalid input(s): POCBNP  CBG: Recent Labs  Lab 05/14/19 0636 05/14/19 1203 05/14/19 1557 05/14/19 2234 05/15/19 0530  GLUCAP 156* 133* 193* 152* 120*     Microbiology: Results for orders placed or performed during the hospital encounter of 03/13/19  Respiratory Panel by RT PCR (Flu A&B, Covid) - Nasopharyngeal Swab     Status: None   Collection Time: 03/13/19  1:49 PM   Specimen: Nasopharyngeal Swab  Result Value Ref Range Status   SARS Coronavirus 2 by RT PCR NEGATIVE NEGATIVE Final    Comment: (NOTE) SARS-CoV-2 target nucleic acids are NOT DETECTED. The SARS-CoV-2 RNA is generally detectable in upper respiratoy specimens during the acute phase of infection. The lowest concentration of SARS-CoV-2 viral copies this assay can detect is 131 copies/mL. A negative result does not preclude SARS-Cov-2 infection and should not be used as the sole basis for treatment or other patient management decisions. A negative result may occur with  improper specimen collection/handling, submission of specimen other than nasopharyngeal swab, presence of viral mutation(s) within the areas targeted by this assay, and inadequate number of viral copies (<131 copies/mL). A negative result must be combined with clinical observations, patient history, and epidemiological information. The expected result is Negative. Fact Sheet for Patients:  PinkCheek.be Fact Sheet for Healthcare Providers:  GravelBags.it This test is not yet ap proved or cleared by the [place] FDA and  has been authorized for detection and/or diagnosis of SARS-CoV-2 by FDA under an Emergency Use Authorization (EUA). This EUA will remain  in effect (meaning this test can be used) for the duration of the COVID-19 declaration under Section 564(b)(1) of the Act, 21 U.S.C. section 360bbb-3(b)(1), unless the authorization is terminated or revoked sooner.    Influenza A by PCR NEGATIVE NEGATIVE Final   Influenza B by PCR NEGATIVE NEGATIVE Final    Comment: (NOTE) The Xpert Xpress SARS-CoV-2/FLU/RSV assay is intended as an aid in   the diagnosis of influenza from Nasopharyngeal swab specimens and  should not be used as a sole basis for treatment. Nasal washings and  aspirates are unacceptable for Xpert Xpress SARS-CoV-2/FLU/RSV  testing. Fact Sheet for Patients: PinkCheek.be Fact Sheet for Healthcare Providers: GravelBags.it This test is not yet approved or cleared by the [place] FDA and  has been authorized for detection and/or diagnosis of SARS-CoV-2 by  FDA under an Emergency  Use Authorization (EUA). This EUA will remain  in effect (meaning this test can be used) for the duration of the  Covid-19 declaration under Section 564(b)(1) of the Act, 21  U.S.C. section 360bbb-3(b)(1), unless the authorization is  terminated or revoked. Performed at [facility], [address], [place], [place] [address]   MRSA PCR Screening     Status: None   Collection Time: 03/13/19  5:18 PM   Specimen: Nasal Mucosa; Nasopharyngeal  Result Value Ref Range Status   MRSA by PCR NEGATIVE NEGATIVE Final    Comment:        The GeneXpert MRSA Assay (FDA approved for NASAL specimens only), is one component of a comprehensive MRSA colonization surveillance program. It is not intended to diagnose MRSA infection nor to guide or monitor treatment for MRSA infections. Performed at [facility], [address] [place], [place] [address]   Surgical pcr screen     Status: None   Collection Time: 03/15/19  9:28 PM   Specimen: Nasal Mucosa; Nasal Swab  Result Value Ref Range Status   MRSA, PCR NEGATIVE NEGATIVE Final   Staphylococcus aureus NEGATIVE NEGATIVE Final    Comment: (NOTE) The Xpert SA Assay (FDA approved for NASAL specimens in patients 24 years of age and older), is one component of a comprehensive surveillance program. It is not intended to diagnose infection nor to guide or monitor treatment. Performed at [facility], [place] [address] [place],  [place] [address]   Culture, respiratory (non-expectorated)     Status: None   Collection Time: 03/19/19  3:00 PM   Specimen: Tracheal Aspirate; Respiratory  Result Value Ref Range Status   Specimen Description TRACHEAL ASPIRATE  Final   Special Requests Normal  Final   Gram Stain   Final    ABUNDANT WBC PRESENT,BOTH PMN AND MONONUCLEAR FEW GRAM POSITIVE COCCI RARE GRAM NEGATIVE RODS    Culture   Final    FEW Consistent with normal respiratory flora. Performed at [facility], [place] [address] [place], [place] [address]    Report Status 03/21/2019 FINAL  Final  Culture, respiratory (non-expectorated)     Status: None   Collection Time: 03/26/19  6:29 AM   Specimen: Tracheal Aspirate; Respiratory  Result Value Ref Range Status   Specimen Description TRACHEAL ASPIRATE  Final   Special Requests NONE  Final   Gram Stain   Final    ABUNDANT WBC PRESENT,BOTH PMN AND MONONUCLEAR NO ORGANISMS SEEN Performed at [facility], [address] [place],  [address]    Culture FEW PSEUDOMONAS AERUGINOSA  Final   Report Status 03/28/2019 FINAL  Final   Organism ID, Bacteria PSEUDOMONAS AERUGINOSA  Final      Susceptibility   Pseudomonas aeruginosa - MIC*    CEFTAZIDIME 8 SENSITIVE Sensitive     CIPROFLOXACIN <=0.25 SENSITIVE Sensitive     GENTAMICIN 4 SENSITIVE Sensitive     IMIPENEM 2 SENSITIVE Sensitive     * FEW PSEUDOMONAS AERUGINOSA  Culture, respiratory (non-expectorated)     Status: None   Collection Time: 04/03/19 11:08 AM   Specimen: Tracheal Aspirate; Respiratory  Result Value Ref Range Status   Specimen Description TRACHEAL ASPIRATE  Final   Special Requests NONE  Final   Gram Stain   Final    FEW WBC PRESENT, PREDOMINANTLY PMN FEW GRAM NEGATIVE RODS RARE YEAST    Culture   Final    FEW PSEUDOMONAS AERUGINOSA FEW CANDIDA TROPICALIS  Report Status 04/06/2019 FINAL  Final   Organism ID, Bacteria PSEUDOMONAS AERUGINOSA  Final      Susceptibility    Pseudomonas aeruginosa - MIC*    CEFTAZIDIME 32 RESISTANT Resistant     CIPROFLOXACIN <=0.25 SENSITIVE Sensitive     GENTAMICIN 4 SENSITIVE Sensitive     IMIPENEM Value in next row Sensitive      2 SENSITIVEPerformed at [facility] [address] [place], [place] [address]    * FEW PSEUDOMONAS AERUGINOSA  Culture, blood (Routine X 2) w Reflex to ID Panel     Status: None   Collection Time: 04/03/19 12:30 PM   Specimen: BLOOD LEFT HAND  Result Value Ref Range Status   Specimen Description BLOOD LEFT HAND  Final   Special Requests   Final    BOTTLES DRAWN AEROBIC ONLY Blood Culture results may not be optimal due to an inadequate volume of blood received in culture bottles   Culture   Final    NO GROWTH 5 DAYS Performed at [facility], [place] [address] [place], [place] [address]    Report Status 04/08/2019 FINAL  Final  Culture, blood (Routine X 2) w Reflex to ID Panel     Status: None   Collection Time: 04/03/19 12:35 PM   Specimen: BLOOD LEFT HAND  Result Value Ref Range Status   Specimen Description BLOOD LEFT HAND  Final   Special Requests   Final    BOTTLES DRAWN AEROBIC ONLY Blood Culture results may not be optimal due to an inadequate volume of blood received in culture bottles   Culture   Final    NO GROWTH 5 DAYS Performed at [facility], [place] [address], [place], [place] [address]    Report Status 04/08/2019 FINAL  Final  Culture, respiratory (non-expectorated)     Status: None   Collection Time: 04/07/19 11:10 AM   Specimen: Tracheal Aspirate; Respiratory  Result Value Ref Range Status   Specimen Description TRACHEAL ASPIRATE  Final   Special Requests NONE  Final   Gram Stain   Final    MODERATE WBC PRESENT, PREDOMINANTLY PMN ABUNDANT GRAM NEGATIVE RODS Performed at [facility], [place] [address], [place], [place] [address]    Culture ABUNDANT PSEUDOMONAS AERUGINOSA  Final   Report Status 04/09/2019 FINAL  Final   Organism ID, Bacteria  PSEUDOMONAS AERUGINOSA  Final      Susceptibility   Pseudomonas aeruginosa - MIC*    CEFTAZIDIME 16 INTERMEDIATE Intermediate     CIPROFLOXACIN <=0.25 SENSITIVE Sensitive     GENTAMICIN 4 SENSITIVE Sensitive     IMIPENEM 1 SENSITIVE Sensitive     * ABUNDANT PSEUDOMONAS AERUGINOSA  Culture, blood (routine x 2)     Status: None   Collection Time: 04/07/19  2:07 PM   Specimen: BLOOD LEFT HAND  Result Value Ref Range Status   Specimen Description BLOOD LEFT HAND  Final   Special Requests   Final    BOTTLES DRAWN AEROBIC ONLY Blood Culture adequate volume Performed at [facility], [place] [address], [place], [place] [address]    Culture NO GROWTH 5 DAYS  Final   Report Status 04/12/2019 FINAL  Final  Culture, blood (routine x 2)     Status: None   Collection Time: 04/07/19  2:07 PM   Specimen: BLOOD LEFT HAND  Result Value Ref Range Status   Specimen Description BLOOD LEFT HAND  Final   Special Requests  Final    BOTTLES DRAWN AEROBIC ONLY Blood Culture adequate volume Performed at [place] [address] [place], [place] [address]    Culture NO GROWTH 5 DAYS  Final   Report Status 04/12/2019 FINAL  Final  Culture, Urine     Status: None   Collection Time: 04/13/19  6:58 PM   Specimen: Urine, Catheterized  Result Value Ref Range Status   Specimen Description URINE, CATHETERIZED  Final   Special Requests NONE  Final   Culture   Final    NO GROWTH Performed at [facility], [address], [place], [place] [address]    Report Status 04/14/2019 FINAL  Final  MRSA PCR Screening     Status: None   Collection Time: 04/15/19 12:53 PM   Specimen: Nasal Mucosa; Nasopharyngeal  Result Value Ref Range Status   MRSA by PCR NEGATIVE NEGATIVE Final    Comment:        The GeneXpert MRSA Assay (FDA approved for NASAL specimens only), is one component of a comprehensive MRSA colonization surveillance program. It is not intended to diagnose MRSA infection nor to  guide or monitor treatment for MRSA infections. Performed at [facility], [place] [address], [place], [place] [address]   Culture, respiratory (non-expectorated)     Status: None   Collection Time: 04/20/19 11:35 AM   Specimen: Tracheal Aspirate; Respiratory  Result Value Ref Range Status   Specimen Description TRACHEAL ASPIRATE  Final   Special Requests Normal  Final   Gram Stain   Final    RARE WBC PRESENT, PREDOMINANTLY PMN FEW GRAM NEGATIVE RODS FEW GRAM POSITIVE RODS Performed at [facility], [place] [address] [place], [place] [address]    Culture MODERATE PSEUDOMONAS AERUGINOSA  Final   Report Status 04/23/2019 FINAL  Final   Organism ID, Bacteria PSEUDOMONAS AERUGINOSA  Final      Susceptibility   Pseudomonas aeruginosa - MIC*    CEFTAZIDIME >=64 RESISTANT Resistant     CIPROFLOXACIN <=0.25 SENSITIVE Sensitive     GENTAMICIN 4 SENSITIVE Sensitive     IMIPENEM >=16 RESISTANT Resistant     * MODERATE PSEUDOMONAS AERUGINOSA  Body fluid culture     Status: None   Collection Time: 04/23/19 12:02 PM   Specimen: Pleural, Left; Body Fluid  Result Value Ref Range Status   Specimen Description PLEURAL LEFT  Final   Special Requests NONE  Final   Gram Stain   Final    ABUNDANT WBC PRESENT,BOTH PMN AND MONONUCLEAR NO ORGANISMS SEEN    Culture   Final    NO GROWTH 3 DAYS Performed at [facility], [address], [place], [place] [address]    Report Status 04/26/2019 FINAL  Final    Coagulation Studies: No results for input(s): LABPROT, INR in the last 72 hours.  Urinalysis: No results for input(s): COLORURINE, LABSPEC, PHURINE, GLUCOSEU, HGBUR, BILIRUBINUR, KETONESUR, PROTEINUR, UROBILINOGEN, NITRITE, LEUKOCYTESUR in the last 72 hours.  Invalid input(s): APPERANCEUR    Imaging: DG Chest 1 View  Result Date: 05/14/2019 CLINICAL DATA:  Atrial fibrillation with rapid ventricular response. Recent CABG. EXAM: CHEST  1 VIEW COMPARISON:  05/13/2019 and CT  chest 04/20/2019. FINDINGS: Feeding tube is followed into the stomach with the tip projecting beyond the inferior margin of the image. Right IJ central line tip is in the upper SVC. Left IJ catheter tip is in the SVC. Left chest tube terminates at the apex of the  left hemithorax. No pneumothorax. Heart is stable in size. Thoracic aorta is calcified. There is diffuse mixed interstitial and airspace opacification with left lower lobe consolidation and bilateral pleural effusions. IMPRESSION: 1. Pulmonary edema and bilateral pleural effusions. 2. Left lower lobe collapse/consolidation. Pneumonia is not excluded. Electronically Signed   By: Lorin Picket M.D.   On: 05/14/2019 07:49   DG Cystogram  Result Date: 05/14/2019 CLINICAL DATA:  Report of bladder thrombus EXAM: CYSTOGRAM FROM OR TECHNIQUE: Contrast was instilled into the urinary bladder in a retrograde manner. FLUOROSCOPY TIME:  Fluoroscopy Time: 0 minutes 6 seconds Number of Acquired Spot Images:  2 COMPARISON:  None. FINDINGS: Limited filling of the bladder. There appears to be mild mucosal irregularity along the walls of the bladder. No filling defects are evident. No significant post drain residual. IMPRESSION: Question a degree of cystitis. No filling defects seen on submitted images within the urinary bladder. No significant post drain residual. Electronically Signed   By: Lowella Grip III M.D.   On: 05/14/2019 09:17     Medications:   . sodium chloride    . sodium chloride    . sodium chloride Stopped (05/08/19 1833)  . epinephrine Stopped (05/13/19 0800)  . feeding supplement (NEPRO CARB STEADY) 60 mL/hr at 05/15/19 0715   . sodium chloride   Intravenous Once  . aspirin  81 mg Oral Daily  . calcium acetate (Phos Binder)  667 mg Per Tube TID WC  . chlorhexidine  15 mL Mouth Rinse BID  . Chlorhexidine Gluconate Cloth  6 each Topical Daily  . epoetin alfa-epbx (RETACRIT) injection  3,000 Units Subcutaneous Q M,W,F-HD  .  famotidine  10 mg Per Tube Daily  . feeding supplement (PRO-STAT SUGAR FREE 64)  60 mL Per Tube BID  . Gerhardt's butt cream   Topical BID  . heparin  4,000 Units Intravenous Once  . hydrocortisone   Rectal BID  . hydrocortisone  25 mg Rectal BID  . insulin aspart  0-15 Units Subcutaneous TID WC  . insulin aspart  0-5 Units Subcutaneous QHS  . magic mouthwash  5 mL Oral TID  . mouth rinse  15 mL Mouth Rinse q12n4p  . multivitamin  1 tablet Per Tube QHS  . nutrition supplement (JUVEN)  1 packet Per Tube BID BM  . patiromer  16.8 g Oral Daily  . predniSONE  20 mg Per Tube Q breakfast   Followed by  . [START ON 05/17/2019] predniSONE  10 mg Per Tube Q breakfast  . simethicone  80 mg Oral TID  . sodium chloride flush  10-40 mL Intracatheter Q12H  . sodium chloride flush  10-40 mL Intracatheter Q12H  . sodium chloride irrigation  3,000 mL Bladder Irrigation Q24H   sodium chloride, sodium chloride, oxyCODONE **AND** acetaminophen, dextrose, heparin, hydrOXYzine, hyoscyamine, levalbuterol, lidocaine (PF), lidocaine-prilocaine, loperamide HCl, ondansetron (ZOFRAN) IV, opium-belladonna, pentafluoroprop-tetrafluoroeth, promethazine, Resource ThickenUp Clear, sodium chloride flush, sodium chloride flush, sodium chloride flush  Assessment/ Plan:  1. Acute kidney Injury: Appears hemodynamically mediated with ATN following postoperative congestive heart failure/cardiogenic shock.  On renal replacement therapy since 2/11 and appears to be tolerating intermittent hemodialysis without problems.  He does not have any renal recovery and will continue dialysis on a MWF schedule; hard to quantify urine output with CBI.    Next dialysis treatment will be 05/16/2019 2.  Acute systolic heart failure/cardiogenic shock: Status post Impella support.  He continues to tolerate hemodialysis without midodrine. 3.  Coronary artery disease status post  three-vessel CABG 4.  Anemia: Secondary to acute/critical illness and  acute blood loss with ongoing hematuria-off Aranesp for suspicion that this was inducing drug rash.  Getting a unit of PRBC today and will switch to Retacrit. 5.  Nutrition: Low albumin likely negative acute phase reactant, monitor with ongoing diet/ONS. 6.  Secondary hyperparathyroidism: Calcium and phosphorus levels currently at goal, monitor with HD. 7.  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  Hyperkalemia.  Resolved. 9.  Diffuse rash   now tapering empiric steroids.  LOS: [place] @TODAY @7 :37 AM

## 2019-05-15 NOTE — Progress Notes (Signed)
  Speech Language Pathology Treatment: Dysphagia  Patient Details Name: Jesse Murphy MRN: [id number] DOB: [birth year] Today's Date: 05/15/2019 Time: 5053-9767 SLP Time Calculation (min) (ACUTE ONLY): 25 min  Assessment / Plan / Recommendation Clinical Impression  Returned to room for education and treatment session with pt.  Reviewed results of MBS and video with Jesse Murphy, pt's son.  Provided pt with coffee per his request.  Educated re: how critical it is that pt tucks his chin with all liquids to eliminate/minimize aspiration as much as reasonably possible. Initially, he required max tactile and verbal cues to achieve chin tuck to full depth of position.  As session progressed, he demonstrated improved carry-over of posture with fewer cues needed.  Jesse Murphy emphasized his willingness to help his father carry-out precautions, particularly if it will allow Jesse Murphy to drink liquids that will bring him some pleasure.  D/W RN.  New sign posted at [facility]  SLP will continue to follow.   HPI HPI: 86 y.o. male with PMH: h/o CAD s/p CABG and clipping of left atrial appendage, who presented with SOB on 2/1. He became hypoxic on 2/2 with pulmonary edema and needed to be intubated on 2/2 and extubated 2/5. Reintubated 2/8.  Pt underwent impella placement and tracheostomy on 2/9. Pt started on CRRT 2/11. Impella removal 2/23. Pt decannulated 3/13.  MBS 3/16 with penetration of nectar to the vocal folds without sensation.  Repeat MBS 3/30 with improved function; chin tuck prevents aspiration with thins; started dys2/thin liquids on that date.      SLP Plan  Continue with current plan of care       Recommendations  Diet recommendations: Dysphagia 2 (fine chop);Thin liquid Liquids provided via: Cup;Straw Medication Administration: Whole meds with puree Supervision: Full supervision/cueing for compensatory strategies;Staff to assist with self feeding Compensations: Chin tuck;Slow rate Postural Changes and/or  Swallow Maneuvers: Seated upright 90 degrees                Oral Care Recommendations: Oral care BID Follow up Recommendations: Skilled Nursing facility SLP Visit Diagnosis: Dysphagia, oropharyngeal phase (R13.12) Plan: Continue with current plan of care       GO              Trew Sunde L. Tivis Ringer, [place] CCC/SLP Acute Rehabilitation Services Office number [phone] Pager [phone]   Juan Quam Laurice 05/15/2019, 2:52 PM

## 2019-05-15 NOTE — Anesthesia Postprocedure Evaluation (Signed)
Anesthesia Post Note  Patient: Jesse Murphy  Procedure(s) Performed: CYSTOSCOPY CLOT EVACUATION FULGRATION CYSTOGRAM AND INSTILLATION OF FORMALIN (N/A Urethra)     Patient location during evaluation: PACU Anesthesia Type: General Level of consciousness: awake and alert Pain management: pain level controlled Vital Signs Assessment: post-procedure vital signs reviewed and stable Respiratory status: spontaneous breathing, nonlabored ventilation, respiratory function stable and patient connected to nasal cannula oxygen Cardiovascular status: blood pressure returned to baseline and stable Postop Assessment: no apparent nausea or vomiting Anesthetic complications: no    Last Vitals:  Vitals:   05/15/19 0600 05/15/19 0700  BP: (!) 110/43 (!) 114/44  Pulse: 81 81  Resp: 13 15  Temp:    SpO2: 94% 91%    Last Pain:  Vitals:   05/15/19 0400  TempSrc: Oral  PainSc: [place]

## 2019-05-15 NOTE — Progress Notes (Addendum)
TCTS DAILY ICU PROGRESS NOTE                   [address]            [place] [address]          [phone]   2 Days Post-Op Procedure(s) (LRB): CYSTOSCOPY CLOT EVACUATION FULGRATION CYSTOGRAM AND INSTILLATION OF FORMALIN (N/A)  Total Length of Stay:  LOS: 62 days   Subjective: OOB in chair this morning Slowly feeling stronger, poor appetite- TF's resumed this am after cortral unclogged  Objective: Vital signs in last 24 hours: Temp:  [96.8 F (36 C)-98.8 F (37.1 C)] 98.8 F (37.1 C) (03/30 0400) Pulse Rate:  [72-87] 81 (03/30 0800) Cardiac Rhythm: Normal sinus rhythm (03/30 0745) Resp:  [9-28] 14 (03/30 0800) BP: (72-128)/(40-79) 114/44 (03/30 0700) SpO2:  [87 %-98 %] 92 % (03/30 0800) Weight:  [84.2 kg-85.7 kg] 84.2 kg (03/30 0500)  Filed Weights   05/14/19 1310 05/14/19 1715 05/15/19 0500  Weight: 85.7 kg 84.5 kg 84.2 kg    Weight change: -1.3 kg   Hemodynamic parameters for last 24 hours:    Intake/Output from previous day: 03/29 0701 - 03/30 0700 In: 1490 [NG/GT:1110; IV Piggyback:50] Out: 971 [Urine:25; Chest Tube:450]  Intake/Output this shift: Total I/O In: 165 [NG/GT:165] Out: 0   Current Meds: Scheduled Meds: . sodium chloride   Intravenous Once  . aspirin  81 mg Oral Daily  . calcium acetate (Phos Binder)  667 mg Per Tube TID WC  . chlorhexidine  15 mL Mouth Rinse BID  . Chlorhexidine Gluconate Cloth  6 each Topical Daily  . Chlorhexidine Gluconate Cloth  6 each Topical Q0600  . epoetin alfa-epbx (RETACRIT) injection  3,000 Units Subcutaneous Q M,W,F-HD  . famotidine  10 mg Per Tube Daily  . feeding supplement (PRO-STAT SUGAR FREE 64)  60 mL Per Tube BID  . Gerhardt's butt cream   Topical BID  . heparin  4,000 Units Intravenous Once  . hydrocortisone   Rectal BID  . hydrocortisone  25 mg Rectal BID  . insulin aspart  0-15 Units Subcutaneous TID WC  . insulin aspart  0-5 Units Subcutaneous QHS  . magic mouthwash  5 mL  Oral TID  . mouth rinse  15 mL Mouth Rinse q12n4p  . multivitamin  1 tablet Per Tube QHS  . nutrition supplement (JUVEN)  1 packet Per Tube BID BM  . patiromer  16.8 g Oral Daily  . predniSONE  20 mg Per Tube Q breakfast   Followed by  . [START ON 05/17/2019] predniSONE  10 mg Per Tube Q breakfast  . simethicone  80 mg Oral TID  . sodium chloride flush  10-40 mL Intracatheter Q12H  . sodium chloride flush  10-40 mL Intracatheter Q12H  . sodium chloride irrigation  3,000 mL Bladder Irrigation Q24H   Continuous Infusions: . sodium chloride    . sodium chloride    . sodium chloride Stopped (05/08/19 1833)  . epinephrine Stopped (05/13/19 0800)  . feeding supplement (NEPRO CARB STEADY) 60 mL/hr at 05/15/19 0715   PRN Meds:.sodium chloride, sodium chloride, oxyCODONE **AND** acetaminophen, dextrose, heparin, hydrOXYzine, hyoscyamine, levalbuterol, lidocaine (PF), lidocaine-prilocaine, loperamide HCl, ondansetron (ZOFRAN) IV, opium-belladonna, pentafluoroprop-tetrafluoroeth, promethazine, Resource ThickenUp Clear, sodium chloride flush, sodium chloride flush, sodium chloride flush  General appearance: alert, cooperative, fatigued and no distress Heart: regular rate and rhythm Lungs: scattered ronchi Abdomen: benign Extremities: minor edema Wound: incis healing well  Lab  Results: CBC: Recent Labs    05/14/19 0142 05/15/19 0526  WBC 6.2 10.0  HGB 10.9* 11.0*  HCT 34.3* 36.4*  PLT 115* 86*   BMET:  Recent Labs    05/14/19 0142 05/14/19 0142 05/14/19 1417 05/15/19 0526  NA 134*  --   --  140  K 6.2*   < > 3.4* 4.7  CL 95*  --   --  102  CO2 22  --   --  26  GLUCOSE 168*  --   --  130*  BUN 162*  --   --  104*  CREATININE 5.54*  --   --  4.05*  CALCIUM 7.8*  --   --  7.9*   < > = values in this interval not displayed.    CMET: Lab Results  Component Value Date   WBC 10.0 05/15/2019   HGB 11.0 (L) 05/15/2019   HCT 36.4 (L) 05/15/2019   PLT 86 (L) 05/15/2019   GLUCOSE  130 (H) 05/15/2019   CHOL 117 03/16/2019   TRIG 52 03/16/2019   HDL 33 (L) 03/16/2019   LDLCALC 74 03/16/2019   ALT 16 05/12/2019   AST 22 05/12/2019   NA 140 05/15/2019   K 4.7 05/15/2019   CL 102 05/15/2019   CREATININE 4.05 (H) 05/15/2019   BUN 104 (H) 05/15/2019   CO2 26 05/15/2019   TSH 3.683 03/13/2019   INR 1.7 (H) 03/27/2019   HGBA1C 6.1 (H) 03/13/2019      PT/INR: No results for input(s): LABPROT, INR in the last 72 hours. Radiology: No results found.   Assessment/Plan: S/P Procedure(s) (LRB): CYSTOSCOPY CLOT EVACUATION FULGRATION CYSTOGRAM AND INSTILLATION OF FORMALIN (N/A)  1 stable on current RX, tx to 2 c 2 next dialysis tomorrow 3 hemodynamics stable in sinus on midodrine, cont with AHF team recs 4 H/H improved 5 no leukocytosis 6 platelets have dropped some, has been thrombocytopenic but stable- monitor closely 7 BS well controlled 8 cont TF's 9 cont therapies John Giovanni PA-C Pager [phone] 05/15/2019 8:26 AM   Bladder issues resolved- foley out, on HD Still with [name]- need to increase po intake- poor candidate for PEG tx to 2 C  patient examined and medical record reviewed,agree with above note. Tharon Aquas Trigt III 05/15/2019

## 2019-05-15 NOTE — Progress Notes (Signed)
Physical Therapy Treatment Patient Details Name: Jesse Murphy MRN: [id number] DOB: 04/18/1928 Today's Date: 05/15/2019    History of Present Illness 86 y.o. male with PMH: h/o CAD s/p CABG and clipping of left atrial appendage, who presented with SOB on 2/1. He became hypoxic on 2/2 with pulmonary edema and needed to be intubated on 2/2 and extubated 2/5. Pt underwent impella placement and tracheostomy on 2/9. Pt started on CRRT 2/11. Impella removal and tracheostomy 2/23. CRRT restarted 3/1. Pt transitioned to intermittent HD. Pt on continuous bladder irrigation.s/p cystoscopy with evacuation of clot and fulgoration of bladder neck bleeders 3/15. Pt with bradycardia and with temporary pacer placed 3/26 and removed 3/27. Pt with Cystoscopy with clot evacuation and fulguration on 3/28.     PT Comments    Pt very fatigued today with legs buckling.    Follow Up Recommendations  CIR     Equipment Recommendations  Other (comment)(defer)    Recommendations for Other Services       Precautions / Restrictions Precautions Precautions: Fall Precaution Comments: watch HR (brady) Restrictions Weight Bearing Restrictions: No RUE Partial Weight Bearing Percentage or Pounds: Can place ~20lbs through his UEs for mobility per MD    Mobility  Bed Mobility Overal bed mobility: Needs Assistance Bed Mobility: Sit to Supine       Sit to supine: Mod assist   General bed mobility comments: Assist to lower trunk and bring legs back up into bed returning to supine  Transfers Overall transfer level: Needs assistance Equipment used: 4-wheeled walker(EVA walker) Transfers: Sit to/from Stand Sit to Stand: +2 safety/equipment;Max assist         General transfer comment: Heavy assist to bring hips up and for balance.   Ambulation/Gait Ambulation/Gait assistance: +2 physical assistance;Mod assist Gait Distance (Feet): 3 Feet Assistive device: (EVA walker) Gait Pattern/deviations: Step-to  pattern;Decreased step length - right;Decreased step length - left;Shuffle Gait velocity: decr Gait velocity interpretation: <1.31 ft/sec, indicative of household ambulator General Gait Details: Pt with knees wanting to buckle. Only amb from recliner back over to bed   Stairs             Wheelchair Mobility    Modified Rankin (Stroke Patients Only)       Balance Overall balance assessment: Needs assistance Sitting-balance support: Feet supported;Single extremity supported Sitting balance-Leahy Scale: Poor Sitting balance - Comments: Min guard-Min A for EOB balance. Postural control: Posterior lean Standing balance support: Bilateral upper extremity supported;During functional activity Standing balance-Leahy Scale: Poor Standing balance comment: Eva walker and min to mod assist for static standing                            Cognition Arousal/Alertness: Awake/alert Behavior During Therapy: Flat affect Overall Cognitive Status: No family/caregiver present to determine baseline cognitive functioning Area of Impairment: Problem solving                             Problem Solving: Slow processing;Requires verbal cues        Exercises      General Comments General comments (skin integrity, edema, etc.): VSS      Pertinent Vitals/Pain Pain Assessment: Faces Faces Pain Scale: No hurt    Home Living                      Prior Function  PT Goals (current goals can now be found in the care plan section) Acute Rehab PT Goals Patient Stated Goal: get better PT Goal Formulation: With patient Time For Goal Achievement: 05/29/19 Potential to Achieve Goals: Fair Progress towards PT goals: Not progressing toward goals - comment(Pt more fatigued today)    Frequency    Min 3X/week      PT Plan Current plan remains appropriate    Co-evaluation              AM-PAC PT "6 Clicks" Mobility   Outcome Measure  Help  needed turning from your back to your side while in a flat bed without using bedrails?: A Little Help needed moving from lying on your back to sitting on the side of a flat bed without using bedrails?: A Little Help needed moving to and from a bed to a chair (including a wheelchair)?: A Lot Help needed standing up from a chair using your arms (e.g., wheelchair or bedside chair)?: A Lot Help needed to walk in hospital room?: A Lot Help needed climbing 3-5 steps with a railing? : Total 6 Click Score: 13    End of Session Equipment Utilized During Treatment: Gait belt;Oxygen Activity Tolerance: Patient limited by fatigue Patient left: in bed;with call bell/phone within reach;with nursing/sitter in room Nurse Communication: Mobility status(nurse assisted with mobility) PT Visit Diagnosis: Other abnormalities of gait and mobility (R26.89);Muscle weakness (generalized) (M62.81)     Time: 9977-4142 PT Time Calculation (min) (ACUTE ONLY): 20 min  Charges:  $Therapeutic Activity: 8-22 mins                     [place] Pager [phone] Office [place] 05/15/2019, 3:22 PM

## 2019-05-16 LAB — RENAL FUNCTION PANEL
Albumin: 2.2 g/dL — ABNORMAL LOW (ref 3.5–5.0)
Anion gap: 13 (ref 5–15)
BUN: 153 mg/dL — ABNORMAL HIGH (ref 8–23)
CO2: 26 mmol/L (ref 22–32)
Calcium: 8.3 mg/dL — ABNORMAL LOW (ref 8.9–10.3)
Chloride: 96 mmol/L — ABNORMAL LOW (ref 98–111)
Creatinine, Ser: 5.19 mg/dL — ABNORMAL HIGH (ref 0.61–1.24)
GFR calc Af Amer: 11 mL/min — ABNORMAL LOW (ref >60)
GFR calc non Af Amer: 9 mL/min — ABNORMAL LOW (ref >60)
Glucose, Bld: 126 mg/dL — ABNORMAL HIGH (ref 70–99)
Phosphorus: 6.4 mg/dL — ABNORMAL HIGH (ref 2.5–4.6)
Potassium: 5.1 mmol/L (ref 3.5–5.1)
Sodium: 135 mmol/L (ref 135–145)

## 2019-05-16 LAB — GLUCOSE, CAPILLARY
Glucose-Capillary: 109 mg/dL — ABNORMAL HIGH (ref 70–99)
Glucose-Capillary: 111 mg/dL — ABNORMAL HIGH (ref 70–99)
Glucose-Capillary: 136 mg/dL — ABNORMAL HIGH (ref 70–99)
Glucose-Capillary: 137 mg/dL — ABNORMAL HIGH (ref 70–99)

## 2019-05-16 LAB — CBC
HCT: 36.4 % — ABNORMAL LOW (ref 39.0–52.0)
Hemoglobin: 11.2 g/dL — ABNORMAL LOW (ref 13.0–17.0)
MCH: 28.9 pg (ref 26.0–34.0)
MCHC: 30.8 g/dL (ref 30.0–36.0)
MCV: 93.8 fL (ref 80.0–100.0)
Platelets: 78 10*3/uL — ABNORMAL LOW (ref 150–400)
RBC: 3.88 MIL/uL — ABNORMAL LOW (ref 4.22–5.81)
RDW: 20.1 % — ABNORMAL HIGH (ref 11.5–15.5)
WBC: 7.1 10*3/uL (ref 4.0–10.5)
nRBC: 0 % (ref 0.0–0.2)

## 2019-05-16 LAB — MAGNESIUM: Magnesium: 2.5 mg/dL — ABNORMAL HIGH (ref 1.7–2.4)

## 2019-05-16 MED ORDER — HEPARIN SODIUM (PORCINE) 1000 UNIT/ML IJ SOLN
INTRAMUSCULAR | Status: AC
  Filled 2019-05-16: qty 4

## 2019-05-16 NOTE — TOC Progression Note (Signed)
Transition of Care [facility]) - Progression Note    Patient Details  Name: Jesse Murphy MRN: [id number] Date of Birth: 03/30/1928  Transition of [facility]) CM/SW Contact  Zenon Mayo, RN Phone Number: 05/16/2019, 6:16 PM  Clinical Narrative:    Patient transferred from [place] on 3/31, s/p CYSTOSCOPY CLOT EVACUATION FULGRATION CYSTOGRAM AND INSTILLATION OF FORMALIN . Plan is for CIR , TOC team will cont to follow.        Expected Discharge Plan and Services                                                 Social Determinants of Health (SDOH) Interventions    Readmission Risk Interventions No flowsheet data found.

## 2019-05-16 NOTE — Progress Notes (Signed)
Inpatient Rehab Admissions Coordinator:    I have no beds available for this patient today.  Will continue to follow for timing of rehab admission pending bed availability.   Shann Medal, PT, DPT Admissions Coordinator [phone] 05/16/19  11:49 AM

## 2019-05-16 NOTE — Progress Notes (Addendum)
[address]       [place] [address]             [phone]      3 Days Post-Op Procedure(s) (LRB): CYSTOSCOPY CLOT EVACUATION FULGRATION CYSTOGRAM AND INSTILLATION OF FORMALIN (N/A) Subjective: Says he's eating a little better Incontinent of urine  Objective: Vital signs in last 24 hours: Temp:  [97.4 F (36.3 C)-98.3 F (36.8 C)] 97.4 F (36.3 C) (03/31 0500) Pulse Rate:  [69-82] 69 (03/31 0500) Cardiac Rhythm: Normal sinus rhythm;Bundle branch block (03/31 0500) Resp:  [11-24] 17 (03/31 0500) BP: (110-126)/(44-64) 115/53 (03/31 0500) SpO2:  [91 %-98 %] 98 % (03/31 0500)  Hemodynamic parameters for last 24 hours:    Intake/Output from previous day: 03/30 0701 - 03/31 0700 In: 765 [P.O.:60; NG/GT:705] Out: 0  Intake/Output this shift: No intake/output data recorded.  General appearance: alert, cooperative, fatigued and no distress Heart: regular rate and rhythm Lungs: mildly dim in bases Abdomen: soft, nontender Extremities: + minor edema Wound: incis healing well  Lab Results: Recent Labs    05/15/19 0526 05/16/19 0532  WBC 10.0 7.1  HGB 11.0* 11.2*  HCT 36.4* 36.4*  PLT 86* 78*   BMET:  Recent Labs    05/15/19 0526 05/16/19 0532  NA 140 135  K 4.7 5.1  CL 102 96*  CO2 26 26  GLUCOSE 130* 126*  BUN 104* 153*  CREATININE 4.05* 5.19*  CALCIUM 7.9* 8.3*    PT/INR: No results for input(s): LABPROT, INR in the last 72 hours. ABG    Component Value Date/Time   PHART 7.369 05/10/2019 1804   HCO3 20.0 05/10/2019 1804   TCO2 21 (L) 05/10/2019 1804   ACIDBASEDEF 5.0 (H) 05/10/2019 1804   O2SAT 99.0 05/10/2019 1804   CBG (last 3)  Recent Labs    05/15/19 1619 05/15/19 2126 05/16/19 0630  GLUCAP 152* 151* 109*    Meds Scheduled Meds: . sodium chloride   Intravenous Once  . aspirin  81 mg Oral Daily  . calcium acetate (Phos Binder)  667 mg Per Tube TID WC  . chlorhexidine  15 mL Mouth Rinse BID  . Chlorhexidine  Gluconate Cloth  6 each Topical Daily  . Chlorhexidine Gluconate Cloth  6 each Topical Q0600  . epoetin alfa-epbx (RETACRIT) injection  3,000 Units Subcutaneous Q M,W,F-HD  . famotidine  10 mg Per Tube Daily  . feeding supplement (NEPRO CARB STEADY)  237 mL Per Tube QID  . feeding supplement (PRO-STAT SUGAR FREE 64)  60 mL Per Tube TID  . Gerhardt's butt cream   Topical BID  . heparin  4,000 Units Intravenous Once  . hydrocortisone   Rectal BID  . hydrocortisone  25 mg Rectal BID  . insulin aspart  0-15 Units Subcutaneous TID WC  . insulin aspart  0-5 Units Subcutaneous QHS  . magic mouthwash  5 mL Oral TID  . mouth rinse  15 mL Mouth Rinse q12n4p  . multivitamin  1 tablet Per Tube QHS  . nutrition supplement (JUVEN)  1 packet Per Tube BID BM  . patiromer  16.8 g Oral Daily  . predniSONE  20 mg Per Tube Q breakfast   Followed by  . [START ON 05/17/2019] predniSONE  10 mg Per Tube Q breakfast  . simethicone  80 mg Oral TID  . sodium chloride flush  10-40 mL Intracatheter Q12H  . sodium chloride flush  10-40 mL Intracatheter Q12H  . sodium  chloride irrigation  3,000 mL Bladder Irrigation Q24H   Continuous Infusions: . sodium chloride    . sodium chloride    . sodium chloride Stopped (05/08/19 1833)  . epinephrine Stopped (05/13/19 0800)   PRN Meds:.sodium chloride, sodium chloride, oxyCODONE **AND** acetaminophen, dextrose, heparin, hydrOXYzine, hyoscyamine, levalbuterol, lidocaine (PF), lidocaine-prilocaine, loperamide HCl, ondansetron (ZOFRAN) IV, opium-belladonna, pentafluoroprop-tetrafluoroeth, promethazine, Resource ThickenUp Clear, sodium chloride flush, sodium chloride flush, sodium chloride flush  Xrays DG Swallowing Func-Speech Pathology  Result Date: 05/15/2019 Objective Swallowing Evaluation: Type of Study: MBS-Modified Barium Swallow Study  Patient Details Name: Jesse Murphy MRN: [id number] Date of Birth: 10-Oct-1928 Today's Date: 05/15/2019 Time: SLP Start Time (ACUTE ONLY):  2778 -SLP Stop Time (ACUTE ONLY): 1300 SLP Time Calculation (min) (ACUTE ONLY): 15 min Past Medical History: Past Medical History: Diagnosis Date . Atrial fibrillation ([place])  . CAD (coronary artery disease)   a. s/p PCI in 1992 b. Coronary CT in 01/2018 showing extensive coronary calcification; FFR indeterminate --> medical management pursued at that time as patient not interested in repeat cath . Cancer [facility])   prostate . Dyspnea  . Hypertension  Past Surgical History: Past Surgical History: Procedure Laterality Date . CHEST TUBE INSERTION Left 04/23/2019  Procedure: INSERTION PLEURAL DRAINAGE CATHETER TO DRAIN LEFT PLEURAL EFFUSION;  Surgeon: Ivin Poot, MD;  Location: [place];  Service: Thoracic;  Laterality: Left; . CLIPPING OF ATRIAL APPENDAGE N/A 03/16/2019  Procedure: CLIPPING OF LEFT  ATRIAL APPENDAGE - USING ATRICLIP SIZE 40;  Surgeon: Ivin Poot, MD;  Location: [place];  Service: Open Heart Surgery;  Laterality: N/A; . COLONOSCOPY N/A 08/25/2017  Procedure: COLONOSCOPY;  Surgeon: Rogene Houston, MD;  [facility];  Service: Endoscopy;  Laterality: N/A; . CORONARY ARTERY BYPASS GRAFT N/A 03/16/2019  Procedure: CORONARY ARTERY BYPASS GRAFTING (CABG), ON PUMP, TIMES THREE, USING LEFT INTERNAL MAMMARY ARTERY, RIGHT GREATER SAPHENOUS VEIN HARVESTED ENDOSCOPICALLY;  Surgeon: Ivin Poot, MD;  Location: [place];  Service: Open Heart Surgery;  Laterality: N/A;  swan only . CYSTOSCOPY WITH FULGERATION N/A 04/30/2019  Procedure: CYSTOSCOPY WITH FULGERATION;  Surgeon: Irine Seal, MD;  Location: [place];  Service: Urology;  Laterality: N/A; . HERNIA REPAIR   . IR FLUORO GUIDE CV LINE LEFT  04/23/2019 . IR US GUIDE VASC ACCESS LEFT  04/23/2019 . MAZE N/A 03/16/2019  Procedure: MAZE;  Surgeon: Ivin Poot, MD;  Location: [place];  Service: Open Heart Surgery;  Laterality: N/A; . PLACEMENT OF IMPELLA LEFT VENTRICULAR ASSIST DEVICE Right 03/27/2019  Procedure: Placement Of Impella Left Ventricular Assist  Device using ABIOMED Impella 5.5 with SmartAssist Device.;  Surgeon: Wonda Olds, MD;  [facility];  Service: Thoracic;  Laterality: Right; . PROSTATE SURGERY   . REMOVAL OF IMPELLA LEFT VENTRICULAR ASSIST DEVICE N/A 04/10/2019  Procedure: REMOVAL OF IMPELLA LEFT VENTRICULAR ASSIST DEVICE WITH INSERTION OF RIGHT FEMORAL ARTERIAL LINE;  Surgeon: Ivin Poot, MD;  Location: [place];  Service: Open Heart Surgery;  Laterality: N/A; . RIGHT/LEFT HEART CATH AND CORONARY ANGIOGRAPHY N/A 03/15/2019  Procedure: RIGHT/LEFT HEART CATH AND CORONARY ANGIOGRAPHY;  Surgeon: Burnell Blanks, MD;  Location: [place] CV LAB;  Service: Cardiovascular;  Laterality: N/A; . TEE WITHOUT CARDIOVERSION N/A 03/16/2019  Procedure: TRANSESOPHAGEAL ECHOCARDIOGRAM (TEE);  Surgeon: Prescott Gum, Collier Salina, MD;  Location: [place];  Service: Open Heart Surgery;  Laterality: N/A; . TRACHEOSTOMY TUBE PLACEMENT N/A 03/27/2019  Procedure: TRACHEOSTOMY placed using [place]iley 8DCT Cuffed.;  Surgeon: Wonda Olds, MD;  Location:  [facility];  Service: Thoracic;  Laterality: N/A; . TRANSURETHRAL RESECTION OF BLADDER NECK N/A 05/13/2019  Procedure: CYSTOSCOPY CLOT EVACUATION FULGRATION CYSTOGRAM AND INSTILLATION OF FORMALIN;  Surgeon: Lucas Mallow, MD;  Location: [place];  Service: Urology;  Laterality: N/A; HPI: 86 y.o. male with PMH: h/o CAD s/p CABG and clipping of left atrial appendage, who presented with SOB on 2/1. He became hypoxic on 2/2 with pulmonary edema and needed to be intubated on 2/2 and extubated 2/5. Reintubated 2/8.  Pt underwent impella placement and tracheostomy on 2/9. Pt started on CRRT 2/11. Impella removal 2/23. Pt decannulated 3/13.  MBS 3/16 with penetration of nectar to the vocal folds without sensation.   Subjective: pleasant, a bit sleepy Assessment / Plan / Recommendation CHL IP CLINICAL IMPRESSIONS 05/15/2019 Clinical Impression Pt presents with improved oropharyngeal swallow function marked by functional oral control  and better base-of-tongue propulsion, much less residue in pharynx post-swallow with better sensation of its presence. Pt continues to have delays in onset of laryngeal vestibule closure - this led, on one occasion, to moderate aspiration of thin liquids during the swallow.  When pt tucked his chin, duration of laryngeal vestibule closure was longer, allowing thin liquids to pass through pharynx and into UES without penetration nor aspiration.  This posture was repeated multiple times to ensure success.  Recommend advancing diet to dysphagia 2, thin liquids - pt may use straw to allow him to tuck chin in advance of sipping drinks.  RN present for study; results/recs reviewed.  SLP Visit Diagnosis Dysphagia, oropharyngeal phase (R13.12) Attention and concentration deficit following -- Frontal lobe and executive function deficit following -- Impact on safety and function Mild aspiration risk   CHL IP TREATMENT RECOMMENDATION 05/15/2019 Treatment Recommendations Therapy as outlined in treatment plan below   Prognosis 05/15/2019 Prognosis for Safe Diet Advancement Good Barriers to Reach Goals -- Barriers/Prognosis Comment -- CHL IP DIET RECOMMENDATION 05/15/2019 SLP Diet Recommendations Dysphagia 2 (Fine chop) solids;Thin liquid Liquid Administration via Straw;Cup Medication Administration Whole meds with puree Compensations Chin tuck;Slow rate Postural Changes --   CHL IP OTHER RECOMMENDATIONS 05/15/2019 Recommended Consults -- Oral Care Recommendations Oral care BID Other Recommendations --   CHL IP FOLLOW UP RECOMMENDATIONS 05/15/2019 Follow up Recommendations Skilled Nursing facility   [facility] IP FREQUENCY AND DURATION 05/15/2019 Speech Therapy Frequency (ACUTE ONLY) min 3x week Treatment Duration 2 weeks      CHL IP ORAL PHASE 05/15/2019 Oral Phase WFL Oral - Pudding Teaspoon -- Oral - Pudding Cup -- Oral - Honey Teaspoon -- Oral - Honey Cup -- Oral - Nectar Teaspoon -- Oral - Nectar Cup -- Oral - Nectar Straw -- Oral - Thin  Teaspoon -- Oral - Thin Cup -- Oral - Thin Straw -- Oral - Puree -- Oral - Mech Soft -- Oral - Regular -- Oral - Multi-Consistency -- Oral - Pill -- Oral Phase - Comment --  CHL IP PHARYNGEAL PHASE 05/15/2019 Pharyngeal Phase Impaired Pharyngeal- Pudding Teaspoon -- Pharyngeal -- Pharyngeal- Pudding Cup -- Pharyngeal -- Pharyngeal- Honey Teaspoon -- Pharyngeal -- Pharyngeal- Honey Cup NT Pharyngeal -- Pharyngeal- Nectar Teaspoon -- Pharyngeal -- Pharyngeal- Nectar Cup NT Pharyngeal -- Pharyngeal- Nectar Straw NT Pharyngeal -- Pharyngeal- Thin Teaspoon -- Pharyngeal -- Pharyngeal- Thin Cup NT Pharyngeal -- Pharyngeal- Thin Straw Delayed swallow initiation-pyriform sinuses;Delayed swallow initiation-vallecula;Penetration/Aspiration during swallow;Moderate aspiration Pharyngeal Material enters airway, passes BELOW cords and not ejected out despite cough attempt by patient Pharyngeal- Puree Delayed swallow initiation-vallecula;Pharyngeal residue - valleculae Pharyngeal  Material does not enter airway Pharyngeal- Mechanical Soft -- Pharyngeal -- Pharyngeal- Regular -- Pharyngeal -- Pharyngeal- Multi-consistency -- Pharyngeal -- Pharyngeal- Pill -- Pharyngeal -- Pharyngeal Comment --  CHL IP CERVICAL ESOPHAGEAL PHASE 05/01/2019 Cervical Esophageal Phase WFL Pudding Teaspoon -- Pudding Cup -- Honey Teaspoon -- Honey Cup -- Nectar Teaspoon -- Nectar Cup -- Nectar Straw -- Thin Teaspoon -- Thin Cup -- Thin Straw -- Puree -- Mechanical Soft -- Regular -- Multi-consistency -- Pill -- Cervical Esophageal Comment -- Juan Quam Laurice 05/15/2019, 2:48 PM               Assessment/Plan: S/P Procedure(s) (LRB): CYSTOSCOPY CLOT EVACUATION FULGRATION CYSTOGRAM AND INSTILLATION OF FORMALIN (N/A)  1 hemodyn stable in sinus rhythm 2 sats good on 2 liters 3 urine incontinence- will see if condom catheter works 4 H/H stable, no leukocytosis 5 ESRD- per nephrology/HD 6 systolic HF- as per AHF team- very stable 7 d2 diet, cont  TF's 8 therapies as able, ? CIR candidate hopefully  LOS: 63 days    John Giovanni PA-C Pager [phone] 05/16/2019  patient examined and medical record reviewed,agree with above note. Tharon Aquas Trigt III 05/17/2019

## 2019-05-16 NOTE — Progress Notes (Signed)
Patient ID: Jesse Murphy, male   DOB: 06/02/1928, 86 y.o.   MRN: [id number]    Advanced Heart Failure Rounding Note  PCP-Cardiologist: Kate Sable, MD   Subjective:    Events - Impella removed 2/23. Intraoperative TEE showed EF ~40% - Had tunneled cath placed 3/9 and left PleurX tube.  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 Emergent TVP placed - went to OR for repeat cystoscopy, fulgeration and formalin installation in bladder  Patient seen in HD earlier today. Feels ok. Denies SOB, orthopnea or PND.    Still with some clear urinary incontinence  Objective:   Weight Range: 85.5 kg Body mass index is 28.67 kg/m.   Vital Signs:   Temp:  [97.4 F (36.3 C)-98.2 F (36.8 C)] 97.7 F (36.5 C) (03/31 1921) Pulse Rate:  [69-78] 78 (03/31 1921) Resp:  [13-25] 18 (03/31 1921) BP: (83-146)/(18-60) 146/53 (03/31 1921) SpO2:  [96 %-100 %] 100 % (03/31 1921) Weight:  [85.5 kg-87 kg] 85.5 kg (03/31 1715) Last BM Date: 05/14/19  Weight change: Filed Weights   05/15/19 0500 05/16/19 1300 05/16/19 1715  Weight: 84.2 kg 87 kg 85.5 kg    Intake/Output:   Intake/Output Summary (Last 24 hours) at 05/16/2019 2227 Last data filed at 05/16/2019 1715 Gross per 24 hour  Intake --  Output 1500 ml  Net -1500 ml     Physical Exam   General:  Elderly weak appearing. On HD. No resp difficulty HEENT: normal Neck: supple. JVP 8-9  + trach wound wealing Carotids 2+ bilat; no bruits. No lymphadenopathy or thryomegaly appreciated. Cor: PMI nondisplaced. Regular rate & rhythm. No rubs, gallops or murmurs. +LIJ catheter Lungs: clear Abdomen: soft, nontender, nondistended. No hepatosplenomegaly. No bruits or masses. Good bowel sounds. Extremities: no cyanosis, clubbing, rash,1-2+  edema Neuro: alert & orientedx3,  cranial nerves grossly intact. moves all 4 extremities w/o difficulty. Affect pleasant   Telemetry   Sinus 70-80s Personally reviewed   Labs    CBC Recent Labs    05/15/19 0526 05/16/19 0532  WBC 10.0 7.1  HGB 11.0* 11.2*  HCT 36.4* 36.4*  MCV 94.1 93.8  PLT 86* 78*   Basic Metabolic Panel Recent Labs    05/15/19 0526 05/16/19 0532  NA 140 135  K 4.7 5.1  CL 102 96*  CO2 26 26  GLUCOSE 130* 126*  BUN 104* 153*  CREATININE 4.05* 5.19*  CALCIUM 7.9* 8.3*  MG 2.2 2.5*  PHOS 6.2* 6.4*   Liver Function Tests Recent Labs    05/15/19 0526 05/16/19 0532  ALBUMIN 2.2* 2.2*   No results for input(s): LIPASE, AMYLASE in the last 72 hours. Cardiac Enzymes No results for input(s): CKTOTAL, CKMB, CKMBINDEX, TROPONINI in the last 72 hours.  BNP: BNP (last 3 results) Recent Labs    03/22/19 0401 03/26/19 0626 03/27/19 1255  BNP 340.4* 687.5* 800.4*    ProBNP (last 3 results) No results for input(s): PROBNP in the last 8760 hours.   D-Dimer No results for input(s): DDIMER in the last 72 hours. Hemoglobin A1C No results for input(s): HGBA1C in the last 72 hours. Fasting Lipid Panel No results for input(s): CHOL, HDL, LDLCALC, TRIG, CHOLHDL, LDLDIRECT in the last 72 hours. Thyroid Function Tests No results for input(s): TSH, T4TOTAL, T3FREE, THYROIDAB in the last 72  hours.  Invalid input(s): FREET3  Other results:   Imaging    No results found.   Medications:     Scheduled Medications: . sodium chloride   Intravenous Once  . aspirin  81 mg Oral Daily  . calcium acetate (Phos Binder)  667 mg Per Tube TID WC  . chlorhexidine  15 mL Mouth Rinse BID  . Chlorhexidine Gluconate Cloth  6 each Topical Daily  . Chlorhexidine Gluconate Cloth  6 each Topical Q0600  . famotidine  10 mg Per Tube Daily  . feeding supplement (NEPRO CARB STEADY)  237 mL Per Tube QID  . feeding supplement (PRO-STAT SUGAR FREE 64)  60 mL Per Tube TID  . Gerhardt's butt cream    Topical BID  . heparin  4,000 Units Intravenous Once  . hydrocortisone   Rectal BID  . hydrocortisone  25 mg Rectal BID  . insulin aspart  0-15 Units Subcutaneous TID WC  . insulin aspart  0-5 Units Subcutaneous QHS  . magic mouthwash  5 mL Oral TID  . mouth rinse  15 mL Mouth Rinse q12n4p  . multivitamin  1 tablet Per Tube QHS  . nutrition supplement (JUVEN)  1 packet Per Tube BID BM  . patiromer  16.8 g Oral Daily  . [START ON 05/17/2019] predniSONE  10 mg Per Tube Q breakfast  . simethicone  80 mg Oral TID  . sodium chloride flush  10-40 mL Intracatheter Q12H  . sodium chloride flush  10-40 mL Intracatheter Q12H    Infusions: . sodium chloride    . sodium chloride    . sodium chloride Stopped (05/08/19 1833)  . epinephrine Stopped (05/13/19 0800)    PRN Medications: sodium chloride, sodium chloride, oxyCODONE **AND** acetaminophen, dextrose, heparin, hydrOXYzine, hyoscyamine, levalbuterol, lidocaine (PF), lidocaine-prilocaine, loperamide HCl, ondansetron (ZOFRAN) IV, opium-belladonna, pentafluoroprop-tetrafluoroeth, promethazine, Resource ThickenUp Clear, sodium chloride flush, sodium chloride flush, sodium chloride flush    Assessment/Plan   1. Acute systolic HF -> Cardiogenic shock - post-op echo on 03/20/19 EF 25-30% (pre-op 30-35%. In 12/2017 EF normal) - Required Impella support post CABG. Impella removed 2/23. Post-op TEE EF 40% - Has developed ESRD - Weight stable but still with peripheral edema - Continue iHD on MWF Schedule. Now off midodrine due to rash (unclear which medication caused). Can add back as needed - unable to tolerate GDMT with low BP and ESRD  2. CAD s/p CABG this admit (LIMA->LAD, SVG->OM, SVG->RCA on 03/15/19) - No s/s ischemia - Continue statin.  - Off ASA due to hematuria and BRBPR  3. Acute hypoxic respiratory failure with Pseudomonas PNA - s/p trach on 03/27/19 - completed meropenem for pseudomonas PNA - trach aspirate 3/5 w/ regrowth of  Pseudomonas again.   - Completed antibiotic course.  -Trach decannulated 3/13. Stable on [place] - Stable no change   4. PAF - s/p MAZE and LAA occlusion - Remains in NSR. Off amio due to rash (unclear which agent it was) - Can reconsider AC in near future as bladder issues stabilize - no change for nw  5. AKI -> ESRD - due to shock/ATN.  - Tunneled cath placed 3/9  - He is tolerating iHD. Midodrine now off.  - Vein mapping complete. Nephrology considering permanent access but no formal plans   6. Hematuria -Cystoscopy 3/15 with fulgeration - Back to OR 3/28 with fulgeration and formalin installation - No further bleeding. Still some urinary incontinence which is clear  7. Complete heart block with profound symptomatic bradycardia -  likely vagal in nature - s/p emergent TVP 3/25 - resolved TVP out.   8. Debility, severe - continue PT/OT  9. F/E/N - Poor po intake.  - Cor-trak clogged. Ned to fix  - Encourage po      10. Left Pleural Effusion  - moderate to large. - s/p PleurX on 3/8.  - TCTS managing. Draining M/Th  10. Diffuse rash - Appearance of drug induced rash. - Steroids started 3/21 -> much improved - Now tapering steroids. Will decrease to prednisone 10 mg on 4/1 - Can add needed meds back 1 by 1 watching for recurrent rash  11. Anemia: - Rewolved - hgb 9.0 -> 7.9 -> 10.9 -> 11.2  12. Dysphagia and BRBPR - ? Possible esophagitis +/- radiation proctitis - GI following.Treated with IV fluconazole. Resolved  13. Hyperkalemia - K 5.1 prior to HD today - lokelma as needed on non HD days. - Renal following  Hopefully to CIR soon.   Glori Bickers, MD  10:27 PM

## 2019-05-16 NOTE — Progress Notes (Signed)
[facility] ROUNDING NOTE   Subjective:   This is an 86 year old gentleman with a fairly long hospitalization developed acute systolic heart failure cardiogenic shock requiring Impella support post CABG postop ejection fraction 40% Impella removed 04/10/2019.  Status post CABG this admit.  History of paroxysmal atrial fibrillation status post maze LAA occlusion.  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 Gloriann Loan.  Now dialysis dependent since 04/08/2019.  Tolerated dialysis 05/14/2019.   Next dialysis planned for 05/16/2019  Blood pressure 115/53 pulse 70 temperature 97.4 O2 sats 96% nasal cannula  Sodium 135 potassium 5.1 chloride 96 CO2 26 BUN 153 creatinine 5.19 glucose 126 phosphorus 6.4 magnesium 2.5 albumin 2.2 hemoglobin 11.2 platelets 78  Aspirin 81 mg daily Phoslyra 660 mg 3 times daily, Retacrit 3000 units Monday Wednesday Friday Pepcid 10 mg daily 1, Deltasone 20 mg daily, IV iron 125 mg Monday Wednesday Friday     Objective:  Vital signs in last 24 hours:  Temp:  [97.4 F (36.3 C)-98.3 F (36.8 C)] 97.4 F (36.3 C) (03/31 0500) Pulse Rate:  [69-82] 69 (03/31 0500) Resp:  [11-24] 17 (03/31 0500) BP: (110-126)/(44-64) 115/53 (03/31 0500) SpO2:  [91 %-98 %] 98 % (03/31 0500)  Weight change:  Filed Weights   05/14/19 1310 05/14/19 1715 05/15/19 0500  Weight: 85.7 kg 84.5 kg 84.2 kg    Intake/Output: I/O last 3 completed shifts: In: 945 [P.O.:60; NG/GT:885] Out: 0    Intake/Output this shift:  No intake/output data recorded.  Gen: Comfortably resting in bed, being cleaned up by nurse. CVS: Pulse regular rhythm, normal rate, S1 and S2 normal Resp: Anteriorly clear to auscultation, no rales.  Left IJ TDC Abd: Soft, obese, nontender Ext: Trace dependent edema.   Basic Metabolic Panel: Recent Labs  Lab 05/12/19 0309 05/12/19 0309 05/12/19 2041 05/12/19 2041 05/13/19 0344  05/13/19 0344 05/14/19 0142 05/14/19 1417 05/15/19 0526 05/16/19 0532  NA 135   < > 136  --  134*  --  134*  --  140 135  K 5.3*   < > 5.9*   < > 5.6*  --  6.2* 3.4* 4.7 5.1  CL 97*   < > 97*  --  95*  --  95*  --  102 96*  CO2 27   < > 24  --  25  --  22  --  26 26  GLUCOSE 161*   < > 190*  --  102*  --  168*  --  130* 126*  BUN 77*   < > 118*  --  131*  --  162*  --  104* 153*  CREATININE 3.13*   < > 4.27*  --  4.63*  --  5.54*  --  4.05* 5.19*  CALCIUM 8.1*   < > 8.2*   < > 7.9*   < > 7.8*  --  7.9* 8.3*  MG 2.1  --   --   --  2.4  --  2.6*  --  2.2 2.5*  PHOS 7.1*  --   --   --  7.7*  --  9.2*  --  6.2* 6.4*   < > = values in this interval not displayed.    Liver Function Tests: Recent Labs  Lab 05/12/19 2041 05/13/19 0344 05/14/19 0142 05/15/19 0526 05/16/19 0532  AST 22  --   --   --   --   ALT 16  --   --   --   --  ALKPHOS 68  --   --   --   --   BILITOT 1.2  --   --   --   --   PROT 5.1*  --   --   --   --   ALBUMIN 2.6* 2.5* 2.5* 2.2* 2.2*   No results for input(s): LIPASE, AMYLASE in the last 168 hours. No results for input(s): AMMONIA in the last 168 hours.  CBC: Recent Labs  Lab 05/13/19 0344 05/13/19 1630 05/14/19 0142 05/15/19 0526 05/16/19 0532  WBC 6.1 6.7 6.2 10.0 7.1  HGB 7.9* 10.9* 10.9* 11.0* 11.2*  HCT 25.9* 34.3* 34.3* 36.4* 36.4*  MCV 92.2 90.5 90.5 94.1 93.8  PLT 116* 109* 115* 86* 78*    Cardiac Enzymes: No results for input(s): CKTOTAL, CKMB, CKMBINDEX, TROPONINI in the last 168 hours.  BNP: Invalid input(s): POCBNP  CBG: Recent Labs  Lab 05/15/19 0848 05/15/19 1222 05/15/19 1619 05/15/19 2126 05/16/19 0630  GLUCAP 131* 113* 152* 151* 109*    Microbiology: Results for orders placed or performed during the hospital encounter of 03/13/19  Respiratory Panel by RT PCR (Flu A&B, Covid) - Nasopharyngeal Swab     Status: None   Collection Time: 03/13/19  1:49 PM   Specimen: Nasopharyngeal Swab  Result Value Ref Range  Status   SARS Coronavirus 2 by RT PCR NEGATIVE NEGATIVE Final    Comment: (NOTE) SARS-CoV-2 target nucleic acids are NOT DETECTED. The SARS-CoV-2 RNA is generally detectable in upper respiratoy specimens during the acute phase of infection. The lowest concentration of SARS-CoV-2 viral copies this assay can detect is 131 copies/mL. A negative result does not preclude SARS-Cov-2 infection and should not be used as the sole basis for treatment or other patient management decisions. A negative result may occur with  improper specimen collection/handling, submission of specimen other than nasopharyngeal swab, presence of viral mutation(s) within the areas targeted by this assay, and inadequate number of viral copies (<131 copies/mL). A negative result must be combined with clinical observations, patient history, and epidemiological information. The expected result is Negative. Fact Sheet for Patients:  PinkCheek.be Fact Sheet for Healthcare Providers:  GravelBags.it This test is not yet ap proved or cleared by the [place] FDA and  has been authorized for detection and/or diagnosis of SARS-CoV-2 by FDA under an Emergency Use Authorization (EUA). This EUA will remain  in effect (meaning this test can be used) for the duration of the COVID-19 declaration under Section 564(b)(1) of the Act, 21 U.S.C. section 360bbb-3(b)(1), unless the authorization is terminated or revoked sooner.    Influenza A by PCR NEGATIVE NEGATIVE Final   Influenza B by PCR NEGATIVE NEGATIVE Final    Comment: (NOTE) The Xpert Xpress SARS-CoV-2/FLU/RSV assay is intended as an aid in  the diagnosis of influenza from Nasopharyngeal swab specimens and  should not be used as a sole basis for treatment. Nasal washings and  aspirates are unacceptable for Xpert Xpress SARS-CoV-2/FLU/RSV  testing. Fact Sheet for  Patients: PinkCheek.be Fact Sheet for Healthcare Providers: GravelBags.it This test is not yet approved or cleared by the [place] FDA and  has been authorized for detection and/or diagnosis of SARS-CoV-2 by  FDA under an Emergency Use Authorization (EUA). This EUA will remain  in effect (meaning this test can be used) for the duration of the  Covid-19 declaration under Section 564(b)(1) of the Act, 21  U.S.C. section 360bbb-3(b)(1), unless the authorization is  terminated or revoked. Performed at [facility],  [address], [place], [place] [address]   MRSA PCR Screening     Status: None   Collection Time: 03/13/19  5:18 PM   Specimen: Nasal Mucosa; Nasopharyngeal  Result Value Ref Range Status   MRSA by PCR NEGATIVE NEGATIVE Final    Comment:        The GeneXpert MRSA Assay (FDA approved for NASAL specimens only), is one component of a comprehensive MRSA colonization surveillance program. It is not intended to diagnose MRSA infection nor to guide or monitor treatment for MRSA infections. Performed at [facility], [address] [place], [place] [address]   Surgical pcr screen     Status: None   Collection Time: 03/15/19  9:28 PM   Specimen: Nasal Mucosa; Nasal Swab  Result Value Ref Range Status   MRSA, PCR NEGATIVE NEGATIVE Final   Staphylococcus aureus NEGATIVE NEGATIVE Final    Comment: (NOTE) The Xpert SA Assay (FDA approved for NASAL specimens in patients 48 years of age and older), is one component of a comprehensive surveillance program. It is not intended to diagnose infection nor to guide or monitor treatment. Performed at [facility], [place] [address] [place], [place] [address]   Culture, respiratory (non-expectorated)     Status: None   Collection Time: 03/19/19  3:00 PM   Specimen: Tracheal Aspirate; Respiratory  Result Value Ref Range Status   Specimen Description TRACHEAL ASPIRATE   Final   Special Requests Normal  Final   Gram Stain   Final    ABUNDANT WBC PRESENT,BOTH PMN AND MONONUCLEAR FEW GRAM POSITIVE COCCI RARE GRAM NEGATIVE RODS    Culture   Final    FEW Consistent with normal respiratory flora. Performed at [facility], [place] [address] [place], [place] [address]    Report Status 03/21/2019 FINAL  Final  Culture, respiratory (non-expectorated)     Status: None   Collection Time: 03/26/19  6:29 AM   Specimen: Tracheal Aspirate; Respiratory  Result Value Ref Range Status   Specimen Description TRACHEAL ASPIRATE  Final   Special Requests NONE  Final   Gram Stain   Final    ABUNDANT WBC PRESENT,BOTH PMN AND MONONUCLEAR NO ORGANISMS SEEN Performed at [facility], [address], [place], [place] [address]    Culture FEW PSEUDOMONAS AERUGINOSA  Final   Report Status 03/28/2019 FINAL  Final   Organism ID, Bacteria PSEUDOMONAS AERUGINOSA  Final      Susceptibility   Pseudomonas aeruginosa - MIC*    CEFTAZIDIME 8 SENSITIVE Sensitive     CIPROFLOXACIN <=0.25 SENSITIVE Sensitive     GENTAMICIN 4 SENSITIVE Sensitive     IMIPENEM 2 SENSITIVE Sensitive     * FEW PSEUDOMONAS AERUGINOSA  Culture, respiratory (non-expectorated)     Status: None   Collection Time: 04/03/19 11:08 AM   Specimen: Tracheal Aspirate; Respiratory  Result Value Ref Range Status   Specimen Description TRACHEAL ASPIRATE  Final   Special Requests NONE  Final   Gram Stain   Final    FEW WBC PRESENT, PREDOMINANTLY PMN FEW GRAM NEGATIVE RODS RARE YEAST    Culture   Final    FEW PSEUDOMONAS AERUGINOSA FEW CANDIDA TROPICALIS    Report Status 04/06/2019 FINAL  Final   Organism ID, Bacteria PSEUDOMONAS AERUGINOSA  Final      Susceptibility   Pseudomonas aeruginosa - MIC*    CEFTAZIDIME 32 RESISTANT Resistant     CIPROFLOXACIN <=0.25 SENSITIVE Sensitive     GENTAMICIN  4 SENSITIVE Sensitive     IMIPENEM Value in next row Sensitive      2 SENSITIVEPerformed at [place] [address], [place], [place] [address]    * FEW PSEUDOMONAS AERUGINOSA  Culture, blood (Routine X 2) w Reflex to ID Panel     Status: None   Collection Time: 04/03/19 12:30 PM   Specimen: BLOOD LEFT HAND  Result Value Ref Range Status   Specimen Description BLOOD LEFT HAND  Final   Special Requests   Final    BOTTLES DRAWN AEROBIC ONLY Blood Culture results may not be optimal due to an inadequate volume of blood received in culture bottles   Culture   Final    NO GROWTH 5 DAYS Performed at [facility], [place] [address] [place], [place] [address]    Report Status 04/08/2019 FINAL  Final  Culture, blood (Routine X 2) w Reflex to ID Panel     Status: None   Collection Time: 04/03/19 12:35 PM   Specimen: BLOOD LEFT HAND  Result Value Ref Range Status   Specimen Description BLOOD LEFT HAND  Final   Special Requests   Final    BOTTLES DRAWN AEROBIC ONLY Blood Culture results may not be optimal due to an inadequate volume of blood received in culture bottles   Culture   Final    NO GROWTH 5 DAYS Performed at [facility], [place] [address], [place], [place] [address]    Report Status 04/08/2019 FINAL  Final  Culture, respiratory (non-expectorated)     Status: None   Collection Time: 04/07/19 11:10 AM   Specimen: Tracheal Aspirate; Respiratory  Result Value Ref Range Status   Specimen Description TRACHEAL ASPIRATE  Final   Special Requests NONE  Final   Gram Stain   Final    MODERATE WBC PRESENT, PREDOMINANTLY PMN ABUNDANT GRAM NEGATIVE RODS Performed at [facility], [place] [address], [place], [place] [address]    Culture ABUNDANT PSEUDOMONAS AERUGINOSA  Final   Report Status 04/09/2019 FINAL  Final   Organism ID, Bacteria PSEUDOMONAS AERUGINOSA  Final      Susceptibility   Pseudomonas aeruginosa - MIC*    CEFTAZIDIME 16 INTERMEDIATE Intermediate     CIPROFLOXACIN <=0.25 SENSITIVE Sensitive     GENTAMICIN 4 SENSITIVE Sensitive     IMIPENEM 1 SENSITIVE  Sensitive     * ABUNDANT PSEUDOMONAS AERUGINOSA  Culture, blood (routine x 2)     Status: None   Collection Time: 04/07/19  2:07 PM   Specimen: BLOOD LEFT HAND  Result Value Ref Range Status   Specimen Description BLOOD LEFT HAND  Final   Special Requests   Final    BOTTLES DRAWN AEROBIC ONLY Blood Culture adequate volume Performed at [facility], [place] [address], [place], [place] [address]    Culture NO GROWTH 5 DAYS  Final   Report Status 04/12/2019 FINAL  Final  Culture, blood (routine x 2)     Status: None   Collection Time: 04/07/19  2:07 PM   Specimen: BLOOD LEFT HAND  Result Value Ref Range Status   Specimen Description BLOOD LEFT HAND  Final   Special Requests   Final    BOTTLES DRAWN AEROBIC ONLY Blood Culture adequate volume Performed at [facility], [place] [address], [place],  [address]    Culture NO GROWTH 5 DAYS  Final   Report Status 04/12/2019 FINAL  Final  Culture,  Urine     Status: None   Collection Time: 04/13/19  6:58 PM   Specimen: Urine, Catheterized  Result Value Ref Range Status   Specimen Description URINE, CATHETERIZED  Final   Special Requests NONE  Final   Culture   Final    NO GROWTH Performed at [facility], [address] [place], [place] [address]    Report Status 04/14/2019 FINAL  Final  MRSA PCR Screening     Status: None   Collection Time: 04/15/19 12:53 PM   Specimen: Nasal Mucosa; Nasopharyngeal  Result Value Ref Range Status   MRSA by PCR NEGATIVE NEGATIVE Final    Comment:        The GeneXpert MRSA Assay (FDA approved for NASAL specimens only), is one component of a comprehensive MRSA colonization surveillance program. It is not intended to diagnose MRSA infection nor to guide or monitor treatment for MRSA infections. Performed at [facility], [place] [address], [place], [place] [address]   Culture, respiratory (non-expectorated)     Status: None   Collection Time: 04/20/19 11:35 AM   Specimen:  Tracheal Aspirate; Respiratory  Result Value Ref Range Status   Specimen Description TRACHEAL ASPIRATE  Final   Special Requests Normal  Final   Gram Stain   Final    RARE WBC PRESENT, PREDOMINANTLY PMN FEW GRAM NEGATIVE RODS FEW GRAM POSITIVE RODS Performed at [facility], [place] [address], [place], [place] [address]    Culture MODERATE PSEUDOMONAS AERUGINOSA  Final   Report Status 04/23/2019 FINAL  Final   Organism ID, Bacteria PSEUDOMONAS AERUGINOSA  Final      Susceptibility   Pseudomonas aeruginosa - MIC*    CEFTAZIDIME >=64 RESISTANT Resistant     CIPROFLOXACIN <=0.25 SENSITIVE Sensitive     GENTAMICIN 4 SENSITIVE Sensitive     IMIPENEM >=16 RESISTANT Resistant     * MODERATE PSEUDOMONAS AERUGINOSA  Body fluid culture     Status: None   Collection Time: 04/23/19 12:02 PM   Specimen: Pleural, Left; Body Fluid  Result Value Ref Range Status   Specimen Description PLEURAL LEFT  Final   Special Requests NONE  Final   Gram Stain   Final    ABUNDANT WBC PRESENT,BOTH PMN AND MONONUCLEAR NO ORGANISMS SEEN    Culture   Final    NO GROWTH 3 DAYS Performed at [facility], [address], [place] [place], [place] [address]    Report Status 04/26/2019 FINAL  Final    Coagulation Studies: No results for input(s): LABPROT, INR in the last 72 hours.  Urinalysis: No results for input(s): COLORURINE, LABSPEC, PHURINE, GLUCOSEU, HGBUR, BILIRUBINUR, KETONESUR, PROTEINUR, UROBILINOGEN, NITRITE, LEUKOCYTESUR in the last 72 hours.  Invalid input(s): APPERANCEUR    Imaging: DG Swallowing Func-Speech Pathology  Result Date: 05/15/2019 Objective Swallowing Evaluation: Type of Study: MBS-Modified Barium Swallow Study  Patient Details Name: TAKAI CHIARAMONTE MRN: [id number] Date of Birth: Aug 09, 1928 Today's Date: 05/15/2019 Time: SLP Start Time (ACUTE ONLY): 2993 -SLP Stop Time (ACUTE ONLY): 1300 SLP Time Calculation (min) (ACUTE ONLY): 15 min Past Medical History: Past Medical History:  Diagnosis Date . Atrial fibrillation ([place])  . CAD (coronary artery disease)   a. s/p PCI in 1992 b. Coronary CT in 01/2018 showing extensive coronary calcification; FFR indeterminate --> medical management pursued at that time as patient not interested in repeat cath . Cancer [facility])   prostate . Dyspnea  . Hypertension  Past Surgical History: Past Surgical  History: Procedure Laterality Date . CHEST TUBE INSERTION Left 04/23/2019  Procedure: INSERTION PLEURAL DRAINAGE CATHETER TO DRAIN LEFT PLEURAL EFFUSION;  Surgeon: Ivin Poot, MD;  Location: [facility];  Service: Thoracic;  Laterality: Left; . CLIPPING OF ATRIAL APPENDAGE N/A 03/16/2019  Procedure: CLIPPING OF LEFT  ATRIAL APPENDAGE - USING ATRICLIP SIZE 40;  Surgeon: Ivin Poot, MD;  Location: [facility];  Service: Open Heart Surgery;  Laterality: N/A; . COLONOSCOPY N/A 08/25/2017  Procedure: COLONOSCOPY;  Surgeon: Rogene Houston, MD;  [facility];  Service: Endoscopy;  Laterality: N/A; . CORONARY ARTERY BYPASS GRAFT N/A 03/16/2019  Procedure: CORONARY ARTERY BYPASS GRAFTING (CABG), ON PUMP, TIMES THREE, USING LEFT INTERNAL MAMMARY ARTERY, RIGHT GREATER SAPHENOUS VEIN HARVESTED ENDOSCOPICALLY;  Surgeon: Ivin Poot, MD;  Location: [facility];  Service: Open Heart Surgery;  Laterality: N/A;  swan only . CYSTOSCOPY WITH FULGERATION N/A 04/30/2019  Procedure: CYSTOSCOPY WITH FULGERATION;  Surgeon: Irine Seal, MD;  Location: [facility];  Service: Urology;  Laterality: N/A; . HERNIA REPAIR   . IR FLUORO GUIDE CV LINE LEFT  04/23/2019 . IR US GUIDE VASC ACCESS LEFT  04/23/2019 . MAZE N/A 03/16/2019  Procedure: MAZE;  Surgeon: Ivin Poot, MD;  Location: [facility] [place];  Service: Open Heart Surgery;  Laterality: N/A; . PLACEMENT OF IMPELLA LEFT VENTRICULAR ASSIST DEVICE Right 03/27/2019  Procedure: Placement Of Impella Left Ventricular Assist Device using ABIOMED Impella 5.5 with SmartAssist Device.;  Surgeon: Wonda Olds, MD;  [facility];  Service: Thoracic;   Laterality: Right; . PROSTATE SURGERY   . REMOVAL OF IMPELLA LEFT VENTRICULAR ASSIST DEVICE N/A 04/10/2019  Procedure: REMOVAL OF IMPELLA LEFT VENTRICULAR ASSIST DEVICE WITH INSERTION OF RIGHT FEMORAL ARTERIAL LINE;  Surgeon: Ivin Poot, MD;  Location: [facility];  Service: Open Heart Surgery;  Laterality: N/A; . RIGHT/LEFT HEART CATH AND CORONARY ANGIOGRAPHY N/A 03/15/2019  Procedure: RIGHT/LEFT HEART CATH AND CORONARY ANGIOGRAPHY;  Surgeon: Burnell Blanks, MD;  Location: [facility];  Service: Cardiovascular;  Laterality: N/A; . TEE WITHOUT CARDIOVERSION N/A 03/16/2019  Procedure: TRANSESOPHAGEAL ECHOCARDIOGRAM (TEE);  Surgeon: Prescott Gum, Collier Salina, MD;  Location: [place];  Service: Open Heart Surgery;  Laterality: N/A; . TRACHEOSTOMY TUBE PLACEMENT N/A 03/27/2019  Procedure: TRACHEOSTOMY placed using Shiley 8DCT Cuffed.;  Surgeon: Wonda Olds, MD;  [facility];  Service: Thoracic;  Laterality: N/A; . TRANSURETHRAL RESECTION OF BLADDER NECK N/A 05/13/2019  Procedure: CYSTOSCOPY CLOT EVACUATION FULGRATION CYSTOGRAM AND INSTILLATION OF FORMALIN;  Surgeon: Lucas Mallow, MD;  Location: [facility];  Service: Urology;  Laterality: N/A; HPI: 86 y.o. male with PMH: h/o CAD s/p CABG and clipping of left atrial appendage, who presented with SOB on 2/1. He became hypoxic on 2/2 with pulmonary edema and needed to be intubated on 2/2 and extubated 2/5. Reintubated 2/8.  Pt underwent impella placement and tracheostomy on 2/9. Pt started on CRRT 2/11. Impella removal 2/23. Pt decannulated 3/13.  MBS 3/16 with penetration of nectar to the vocal folds without sensation.   Subjective: pleasant, a bit sleepy Assessment / Plan / Recommendation CHL IP CLINICAL IMPRESSIONS 05/15/2019 Clinical Impression Pt presents with improved oropharyngeal swallow function marked by functional oral control and better base-of-tongue propulsion, much less residue in pharynx post-swallow with better sensation of its presence. Pt  continues to have delays in onset of laryngeal vestibule closure - this led, on one occasion, to moderate aspiration of thin liquids during the swallow.  When pt tucked his chin,  duration of laryngeal vestibule closure was longer, allowing thin liquids to pass through pharynx and into UES without penetration nor aspiration.  This posture was repeated multiple times to ensure success.  Recommend advancing diet to dysphagia 2, thin liquids - pt may use straw to allow him to tuck chin in advance of sipping drinks.  RN present for study; results/recs reviewed.  SLP Visit Diagnosis Dysphagia, oropharyngeal phase (R13.12) Attention and concentration deficit following -- Frontal lobe and executive function deficit following -- Impact on safety and function Mild aspiration risk   CHL IP TREATMENT RECOMMENDATION 05/15/2019 Treatment Recommendations Therapy as outlined in treatment plan below   Prognosis 05/15/2019 Prognosis for Safe Diet Advancement Good Barriers to Reach Goals -- Barriers/Prognosis Comment -- CH[facility] IP DIET RECOMMENDATION 05/15/2019 SLP Diet Recommendations Dysphagia 2 (Fine chop) solids;Thin liquid Liquid Administration via Straw;Cup Medication Administration Whole meds with puree Compensations Chin tuck;Slow rate Postural Changes --   CHL IP OTHER RECOMMENDATIONS 05/15/2019 Recommended Consults -- Oral Care Recommendations Oral care BID Other Recommendations --   CH[facility] IP FOLLOW UP RECOMMENDATIONS 05/15/2019 Follow up Recommendations Skilled Nursing facility   [facility] IP FREQUENCY AND DURATION 05/15/2019 Speech Therapy Frequency (ACUTE ONLY) min 3x week Treatment Duration 2 weeks      CHL IP ORAL PHASE 05/15/2019 Oral Phase WFL Oral - Pudding Teaspoon -- Oral - Pudding Cup -- Oral - Honey Teaspoon -- Oral - Honey Cup -- Oral - Nectar Teaspoon -- Oral - Nectar Cup -- Oral - Nectar Straw -- Oral - Thin Teaspoon -- Oral - Thin Cup -- Oral - Thin Straw -- Oral - Puree -- Oral - Mech Soft -- Oral - Regular -- Oral -  Multi-Consistency -- Oral - Pill -- Oral Phase - Comment --  CH[facility] IP PHARYNGEAL PHASE 05/15/2019 Pharyngeal Phase Impaired Pharyngeal- Pudding Teaspoon -- Pharyngeal -- Pharyngeal- Pudding Cup -- Pharyngeal -- Pharyngeal- Honey Teaspoon -- Pharyngeal -- Pharyngeal- Honey Cup NT Pharyngeal -- Pharyngeal- Nectar Teaspoon -- Pharyngeal -- Pharyngeal- Nectar Cup NT Pharyngeal -- Pharyngeal- Nectar Straw NT Pharyngeal -- Pharyngeal- Thin Teaspoon -- Pharyngeal -- Pharyngeal- Thin Cup NT Pharyngeal -- Pharyngeal- Thin Straw Delayed swallow initiation-pyriform sinuses;Delayed swallow initiation-vallecula;Penetration/Aspiration during swallow;Moderate aspiration Pharyngeal Material enters airway, passes BELOW cords and not ejected out despite cough attempt by patient Pharyngeal- Puree Delayed swallow initiation-vallecula;Pharyngeal residue - valleculae Pharyngeal Material does not enter airway Pharyngeal- Mechanical Soft -- Pharyngeal -- Pharyngeal- Regular -- Pharyngeal -- Pharyngeal- Multi-consistency -- Pharyngeal -- Pharyngeal- Pill -- Pharyngeal -- Pharyngeal Comment --  CHL IP CERVICAL ESOPHAGEAL PHASE 05/01/2019 Cervical Esophageal Phase WFL Pudding Teaspoon -- Pudding Cup -- Honey Teaspoon -- Honey Cup -- Nectar Teaspoon -- Nectar Cup -- Nectar Straw -- Thin Teaspoon -- Thin Cup -- Thin Straw -- Puree -- Mechanical Soft -- Regular -- Multi-consistency -- Pill -- Cervical Esophageal Comment -- Juan Quam Laurice 05/15/2019, 2:48 PM                Medications:   . sodium chloride    . sodium chloride    . sodium chloride Stopped (05/08/19 1833)  . epinephrine Stopped (05/13/19 0800)   . sodium chloride   Intravenous Once  . aspirin  81 mg Oral Daily  . calcium acetate (Phos Binder)  667 mg Per Tube TID WC  . chlorhexidine  15 mL Mouth Rinse BID  . Chlorhexidine Gluconate Cloth  6 each Topical Daily  . Chlorhexidine Gluconate Cloth  6 each Topical Q0600  . epoetin alfa-epbx (RETACRIT) injection  3,000  Units Subcutaneous Q M,W,F-HD  . famotidine  10 mg Per Tube Daily  . feeding supplement (NEPRO CARB STEADY)  237 mL Per Tube QID  . feeding supplement (PRO-STAT SUGAR FREE 64)  60 mL Per Tube TID  . Gerhardt's butt cream   Topical BID  . heparin  4,000 Units Intravenous Once  . hydrocortisone   Rectal BID  . hydrocortisone  25 mg Rectal BID  . insulin aspart  0-15 Units Subcutaneous TID WC  . insulin aspart  0-5 Units Subcutaneous QHS  . magic mouthwash  5 mL Oral TID  . mouth rinse  15 mL Mouth Rinse q12n4p  . multivitamin  1 tablet Per Tube QHS  . nutrition supplement (JUVEN)  1 packet Per Tube BID BM  . patiromer  16.8 g Oral Daily  . predniSONE  20 mg Per Tube Q breakfast   Followed by  . [START ON 05/17/2019] predniSONE  10 mg Per Tube Q breakfast  . simethicone  80 mg Oral TID  . sodium chloride flush  10-40 mL Intracatheter Q12H  . sodium chloride flush  10-40 mL Intracatheter Q12H  . sodium chloride irrigation  3,000 mL Bladder Irrigation Q24H   sodium chloride, sodium chloride, oxyCODONE **AND** acetaminophen, dextrose, heparin, hydrOXYzine, hyoscyamine, levalbuterol, lidocaine (PF), lidocaine-prilocaine, loperamide HCl, ondansetron (ZOFRAN) IV, opium-belladonna, pentafluoroprop-tetrafluoroeth, promethazine, Resource ThickenUp Clear, sodium chloride flush, sodium chloride flush, sodium chloride flush  Assessment/ Plan:  1. Acute kidney Injury: Appears hemodynamically mediated with ATN following postoperative congestive heart failure/cardiogenic shock.  On renal replacement therapy since 2/11 and appears to be tolerating intermittent hemodialysis without problems.  He does not have any renal recovery and will continue dialysis on a MWF schedule   Next dialysis treatment will be 05/16/2019 2.  Acute systolic heart failure/cardiogenic shock: Status post Impella support.  He continues to tolerate hemodialysis without midodrine. 3.  Coronary artery disease status post three-vessel  CABG 4.  Anemia: Secondary to acute/critical illness and acute blood loss with ongoing hematuria-off Aranesp for suspicion that this was inducing drug rash.    Retacrit 3000 units Monday Wednesday Friday 5.  Nutrition: Low albumin likely negative acute phase reactant, monitor with ongoing diet/ONS. 6.  Secondary hyperparathyroidism: Calcium and phosphorus levels currently at goal, monitor with HD. 7.  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  Hyperkalemia.  Resolved. 9.  Diffuse rash   now tapering empiric steroids.  LOS: [place] @TODAY @7 :31 AM

## 2019-05-16 NOTE — Progress Notes (Signed)
3 Days Post-Op  Subjective: Mr. Spinnato is doing well from a GU standpoint.  He has no suprapubic pain.  He is totally incontinence but the urine remains clear.    ROS:  Review of Systems  Constitutional: Negative for chills and fever.  Gastrointestinal: Negative for abdominal pain.    Anti-infectives: Anti-infectives (From admission, onward)   Start     Dose/Rate Route Frequency Ordered Stop   05/13/19 1100  ceFAZolin (ANCEF) IVPB 2g/100 mL premix    Note to Pharmacy: On call for the OR   2 g 200 mL/hr over 30 Minutes Intravenous To [facility] 05/12/19 1745 05/13/19 1412   05/08/19 1400  fluconazole (DIFLUCAN) IVPB 200 mg     200 mg 100 mL/hr over 60 Minutes Intravenous Every 24 hours 05/07/19 1319 05/11/19 1601   05/07/19 1400  fluconazole (DIFLUCAN) IVPB 400 mg     400 mg 100 mL/hr over 120 Minutes Intravenous  Once 05/07/19 1319 05/08/19 0700   04/30/19 1045  ceFAZolin (ANCEF) IVPB 2g/100 mL premix     2 g 200 mL/hr over 30 Minutes Intravenous  Once 04/30/19 1031 04/30/19 1120   04/30/19 1031  ceFAZolin (ANCEF) 2-4 GM/100ML-% IVPB    Note to Pharmacy: Henrine Screws   : cabinet override      04/30/19 1031 04/30/19 1108   04/26/19 1526  ciprofloxacin (CIPRO) IVPB 400 mg  Status:  Discontinued     400 mg 200 mL/hr over 60 Minutes Intravenous 60 min pre-op 04/26/19 1526 04/26/19 1531   04/25/19 0800  ciprofloxacin (CIPRO) tablet 500 mg     500 mg Oral Every 24 hours 04/24/19 1120 04/27/19 0959   04/22/19 1345  ceFAZolin (ANCEF) IVPB 2g/100 mL premix     2 g 200 mL/hr over 30 Minutes Intravenous To Radiology 04/22/19 1341 04/23/19 0959   04/22/19 1130  ciprofloxacin (CIPRO) tablet 500 mg  Status:  Discontinued     500 mg Oral 2 times daily 04/22/19 1124 04/24/19 1120   04/20/19 1600  cefTRIAXone (ROCEPHIN) 1 g in sodium chloride 0.9 % 100 mL IVPB  Status:  Discontinued     1 g 200 mL/hr over 30 Minutes Intravenous Every 24 hours 04/20/19 1456 04/22/19 1124   04/19/19 1200   ceFAZolin (ANCEF) IVPB 1 g/50 mL premix     1 g 100 mL/hr over 30 Minutes Intravenous  Once 04/18/19 1204 04/19/19 1311   04/13/19 2000  meropenem (MERREM) 500 mg in sodium chloride 0.9 % 100 mL IVPB     500 mg 200 mL/hr over 30 Minutes Intravenous Every 24 hours 04/12/19 1242 04/13/19 2134   04/12/19 1800  meropenem (MERREM) 1 g in sodium chloride 0.9 % 100 mL IVPB  Status:  Discontinued     1 g 200 mL/hr over 30 Minutes Intravenous Every 12 hours 04/12/19 0826 04/12/19 1236   04/10/19 1800  meropenem (MERREM) 2 g in sodium chloride 0.9 % 100 mL IVPB  Status:  Discontinued     2 g 200 mL/hr over 30 Minutes Intravenous Every 12 hours 04/10/19 1400 04/12/19 0826   04/10/19 1637  vancomycin (VANCOCIN) powder  Status:  Discontinued       As needed 04/10/19 1638 04/10/19 1836   04/07/19 1100  meropenem (MERREM) 1 g in sodium chloride 0.9 % 100 mL IVPB  Status:  Discontinued     1 g 200 mL/hr over 30 Minutes Intravenous 2 times daily 04/07/19 1011 04/07/19 1012   04/07/19 1100  meropenem (MERREM)  1 g in sodium chloride 0.9 % 100 mL IVPB  Status:  Discontinued     1 g 200 mL/hr over 30 Minutes Intravenous Every 8 hours 04/07/19 1012 04/10/19 1400   04/04/19 1700  vancomycin (VANCOCIN) IVPB 1000 mg/200 mL premix  Status:  Discontinued     1,000 mg 200 mL/hr over 60 Minutes Intravenous Every 24 hours 04/04/19 1612 04/06/19 1536   04/04/19 1000  vancomycin (VANCOREADY) IVPB 1250 mg/250 mL  Status:  Discontinued     1,250 mg 166.7 mL/hr over 90 Minutes Intravenous Every 24 hours 04/03/19 0925 04/04/19 1009   04/03/19 0930  vancomycin (VANCOREADY) IVPB 2000 mg/400 mL     2,000 mg 200 mL/hr over 120 Minutes Intravenous  Once 04/03/19 0925 04/03/19 1528   03/29/19 2200  cefTAZidime (FORTAZ) 2 g in sodium chloride 0.9 % 100 mL IVPB     2 g 200 mL/hr over 30 Minutes Intravenous Every 12 hours 03/29/19 1256 04/03/19 2244   03/28/19 1100  cefTAZidime (FORTAZ) 1 g in sodium chloride 0.9 % 100 mL IVPB   Status:  Discontinued     1 g 200 mL/hr over 30 Minutes Intravenous Every 24 hours 03/28/19 0950 03/28/19 0950   03/28/19 1100  cefTAZidime (FORTAZ) 2 g in sodium chloride 0.9 % 100 mL IVPB  Status:  Discontinued     2 g 200 mL/hr over 30 Minutes Intravenous Every 24 hours 03/28/19 0950 03/28/19 0951   03/28/19 1100  cefTAZidime (FORTAZ) 2 g in sodium chloride 0.9 % 100 mL IVPB  Status:  Discontinued     2 g 200 mL/hr over 30 Minutes Intravenous Every 24 hours 03/28/19 0952 03/29/19 1256   03/26/19 1200  ceFEPIme (MAXIPIME) 2 g in sodium chloride 0.9 % 100 mL IVPB  Status:  Discontinued     2 g 200 mL/hr over 30 Minutes Intravenous Daily 03/26/19 1104 03/28/19 0950   03/19/19 1000  cefTRIAXone (ROCEPHIN) 1 g in sodium chloride 0.9 % 100 mL IVPB     1 g 200 mL/hr over 30 Minutes Intravenous Daily 03/19/19 0810 03/25/19 1117   03/16/19 2230  vancomycin (VANCOCIN) IVPB 1000 mg/200 mL premix     1,000 mg 200 mL/hr over 60 Minutes Intravenous  Once 03/16/19 1721 03/17/19 0024   03/16/19 1830  cefUROXime (ZINACEF) 1.5 g in sodium chloride 0.9 % 100 mL IVPB     1.5 g 200 mL/hr over 30 Minutes Intravenous Every 12 hours 03/16/19 1721 03/18/19 0557   03/16/19 0400  vancomycin (VANCOREADY) IVPB 1500 mg/300 mL  Status:  Discontinued     1,500 mg 150 mL/hr over 120 Minutes Intravenous To Surgery 03/15/19 1425 03/16/19 1715   03/16/19 0400  cefUROXime (ZINACEF) 1.5 g in sodium chloride 0.9 % 100 mL IVPB  Status:  Discontinued     1.5 g 200 mL/hr over 30 Minutes Intravenous To Surgery 03/15/19 1425 03/16/19 1715   03/16/19 0400  cefUROXime (ZINACEF) 750 mg in sodium chloride 0.9 % 100 mL IVPB     750 mg 200 mL/hr over 30 Minutes Intravenous To Surgery 03/15/19 1425 03/16/19 1540      Current Facility-Administered Medications  Medication Dose Route Frequency Provider Last Rate Last Admin  . 0.9 %  sodium chloride infusion (Manually program via Guardrails IV Fluids)   Intravenous Once Rexene Alberts, MD      . 0.9 %  sodium chloride infusion  100 mL Intravenous PRN Irine Seal, MD      .  0.9 %  sodium chloride infusion  100 mL Intravenous PRN Irine Seal, MD      . 0.9 %  sodium chloride infusion   Intravenous Continuous Irine Seal, MD   Stopped at 05/08/19 1833  . oxyCODONE (ROXICODONE) 5 MG/5ML solution 5 mg  5 mg Per Tube Q4H PRN Prescott Gum, Collier Salina, MD   5 mg at 05/13/19 2309   And  . acetaminophen (TYLENOL) 160 MG/5ML solution 325 mg  325 mg Per Tube Q4H PRN Ivin Poot, MD   325 mg at 05/12/19 0035  . aspirin chewable tablet 81 mg  81 mg Oral Daily Ivin Poot, MD   81 mg at 05/15/19 0910  . calcium acetate (Phos Binder) (PHOSLYRA) 667 MG/5ML oral solution 667 mg  667 mg Per Tube TID WC Ivin Poot, MD   667 mg at 05/15/19 1830  . chlorhexidine (PERIDEX) 0.12 % solution 15 mL  15 mL Mouth Rinse BID Irine Seal, MD   15 mL at 05/15/19 2125  . Chlorhexidine Gluconate Cloth 2 % PADS 6 each  6 each Topical Daily Ivin Poot, MD   6 each at 05/14/19 1740  . Chlorhexidine Gluconate Cloth 2 % PADS 6 each  6 each Topical Q0600 Edrick Oh, MD   6 each at 05/16/19 0536  . dextrose 50 % solution 0-50 mL  0-50 mL Intravenous PRN Irine Seal, MD      . EPINEPHrine (ADRENALIN) 4 mg in NS 250 mL (0.016 mg/mL) premix infusion  0-2 mcg/min Intravenous Titrated Rexene Alberts, MD   Stopped at 05/13/19 0800  . epoetin alfa-epbx (RETACRIT) injection 3,000 Units  3,000 Units Subcutaneous Q M,W,F-HD Elmarie Shiley, MD   3,000 Units at 05/14/19 1559  . famotidine (PEPCID) tablet 10 mg  10 mg Per Tube Daily Donnamae Jude, RPH   10 mg at 05/15/19 0910  . feeding supplement (NEPRO CARB STEADY) liquid 237 mL  237 mL Per Tube QID Prescott Gum, Collier Salina, MD   237 mL at 05/15/19 2125  . feeding supplement (PRO-STAT SUGAR FREE 64) liquid 60 mL  60 mL Per Tube TID Ivin Poot, MD   60 mL at 05/15/19 2125  . Gerhardt's butt cream   Topical BID Irine Seal, MD   Given at 05/15/19 2127  .  heparin injection 1,000 Units  1,000 Units Intravenous Q dialysis Edrick Oh, MD   2,700 Units at 05/14/19 1646  . heparin injection 4,000 Units  4,000 Units Intravenous Once Justin Mend, MD      . hydrocortisone (ANUSOL-HC) 2.5 % rectal cream   Rectal BID Irine Seal, MD   Given at 05/15/19 2128  . hydrocortisone (ANUSOL-HC) suppository 25 mg  25 mg Rectal BID Prescott Gum, Collier Salina, MD   25 mg at 05/15/19 0910  . hydrOXYzine (ATARAX) 10 MG/5ML syrup 10 mg  10 mg Per Tube Q6H PRN Bensimhon, Shaune Pascal, MD      . hyoscyamine (LEVSIN SL) SL tablet 0.125 mg  0.125 mg Sublingual Q6H PRN Prescott Gum, Collier Salina, MD   0.125 mg at 05/14/19 1010  . insulin aspart (novoLOG) injection 0-15 Units  0-15 Units Subcutaneous TID WC Ivin Poot, MD   3 Units at 05/15/19 1740  . insulin aspart (novoLOG) injection 0-5 Units  0-5 Units Subcutaneous QHS Ivin Poot, MD   2 Units at 05/10/19 2341  . levalbuterol (XOPENEX) nebulizer solution 0.63 mg  0.63 mg Nebulization Q6H PRN Irine Seal, MD  0.63 mg at 04/26/19 1353  . lidocaine (PF) (XYLOCAINE) 1 % injection 5 mL  5 mL Intradermal PRN Irine Seal, MD      . lidocaine-prilocaine (EMLA) cream 1 application  1 application Topical PRN Irine Seal, MD      . loperamide HCl (IMODIUM) 1 MG/7.5ML suspension 4 mg  4 mg Per Tube PRN Ivin Poot, MD      . magic mouthwash  5 mL Oral TID Prescott Gum, Collier Salina, MD   5 mL at 05/15/19 2125  . MEDLINE mouth rinse  15 mL Mouth Rinse q12n4p Irine Seal, MD   15 mL at 05/15/19 1515  . multivitamin (RENA-VIT) tablet 1 tablet  1 tablet Per Tube QHS Prescott Gum, Collier Salina, MD   1 tablet at 05/15/19 2125  . nutrition supplement (JUVEN) (JUVEN) powder packet 1 packet  1 packet Per Tube BID BM Ivin Poot, MD   1 packet at 05/15/19 1321  . ondansetron (ZOFRAN) injection 4 mg  4 mg Intravenous Q6H PRN Irine Seal, MD   4 mg at 05/10/19 1721  . opium-belladonna (B&O SUPPRETTES) 16.2-60 MG suppository 1 suppository  1 suppository Rectal  Q8H PRN Ivin Poot, MD   1 suppository at 05/14/19 1317  . patiromer Daryll Drown) packet 16.8 g  16.8 g Oral Daily Elmarie Shiley, MD   16.8 g at 05/14/19 1316  . pentafluoroprop-tetrafluoroeth (GEBAUERS) aerosol 1 application  1 application Topical PRN Irine Seal, MD      . predniSONE (DELTASONE) tablet 20 mg  20 mg Per Tube Q breakfast Bensimhon, Shaune Pascal, MD   20 mg at 05/15/19 0911   Followed by  . [START ON 05/17/2019] predniSONE (DELTASONE) tablet 10 mg  10 mg Per Tube Q breakfast Bensimhon, Shaune Pascal, MD      . promethazine (PHENERGAN) injection 6.25 mg  6.25 mg Intravenous Q6H PRN Irine Seal, MD   6.25 mg at 05/08/19 1011  . Resource ThickenUp Clear   Oral PRN Irine Seal, MD   Given at 05/02/19 [phone]  . simethicone (MYLICON) chewable tablet 80 mg  80 mg Oral TID Ivin Poot, MD   80 mg at 05/15/19 2125  . sodium chloride flush (NS) 0.9 % injection 10-40 mL  10-40 mL Intracatheter Q12H Irine Seal, MD   20 mL at 05/14/19 2152  . sodium chloride flush (NS) 0.9 % injection 10-40 mL  10-40 mL Intracatheter PRN Irine Seal, MD      . sodium chloride flush (NS) 0.9 % injection 10-40 mL  10-40 mL Intracatheter Q12H Ivin Poot, MD   10 mL at 05/15/19 2127  . sodium chloride flush (NS) 0.9 % injection 10-40 mL  10-40 mL Intracatheter PRN Ivin Poot, MD   10 mL at 05/15/19 0911  . sodium chloride flush (NS) 0.9 % injection 3 mL  3 mL Intravenous PRN Irine Seal, MD   3 mL at 04/16/19 0954  . sodium chloride irrigation 0.9 % 3,000 mL  3,000 mL Bladder Irrigation Q24H Marton Redwood III, MD   3,000 mL at 05/13/19 1151     Objective: Vital signs in last 24 hours: Temp:  [97.4 F (36.3 C)-98.3 F (36.8 C)] 97.4 F (36.3 C) (03/31 0500) Pulse Rate:  [69-82] 69 (03/31 0500) Resp:  [11-24] 17 (03/31 0500) BP: (110-126)/(44-64) 115/53 (03/31 0500) SpO2:  [91 %-98 %] 98 % (03/31 0500)  Intake/Output from previous day: 03/30 0701 - 03/31 0700 In: 765 [P.O.:60; NG/GT:705] Out: 0  Intake/Output this shift: No intake/output data recorded.   Physical Exam Vitals reviewed.  Constitutional:      Appearance: Normal appearance.  Genitourinary:    Comments: He has scrotal edema with a retracted penis.  Neurological:     Mental Status: He is alert.     Lab Results:  Recent Labs    05/15/19 0526 05/16/19 0532  WBC 10.0 7.1  HGB 11.0* 11.2*  HCT 36.4* 36.4*  PLT 86* 78*   BMET Recent Labs    05/15/19 0526 05/16/19 0532  NA 140 135  K 4.7 5.1  CL 102 96*  CO2 26 26  GLUCOSE 130* 126*  BUN 104* 153*  CREATININE 4.05* 5.19*  CALCIUM 7.9* 8.3*   PT/INR No results for input(s): LABPROT, INR in the last 72 hours. ABG No results for input(s): PHART, HCO3 in the last 72 hours.  Invalid input(s): PCO2, PO2  Studies/Results: DG Swallowing Func-Speech Pathology  Result Date: 05/15/2019 Objective Swallowing Evaluation: Type of Study: MBS-Modified Barium Swallow Study  Patient Details Name: Jesse Murphy MRN: [id number] Date of Birth: 09-20-1928 Today's Date: 05/15/2019 Time: SLP Start Time (ACUTE ONLY): 2979 -SLP Stop Time (ACUTE ONLY): 1300 SLP Time Calculation (min) (ACUTE ONLY): 15 min Past Medical History: Past Medical History: Diagnosis Date . Atrial fibrillation ([place])  . CAD (coronary artery disease)   a. s/p PCI in 1992 b. Coronary CT in 01/2018 showing extensive coronary calcification; FFR indeterminate --> medical management pursued at that time as patient not interested in repeat cath . Cancer [facility])   prostate . Dyspnea  . Hypertension  Past Surgical History: Past Surgical History: Procedure Laterality Date . CHEST TUBE INSERTION Left 04/23/2019  Procedure: INSERTION PLEURAL DRAINAGE CATHETER TO DRAIN LEFT PLEURAL EFFUSION;  Surgeon: Ivin Poot, MD;  Location: [place];  Service: Thoracic;  Laterality: Left; . CLIPPING OF ATRIAL APPENDAGE N/A 03/16/2019  Procedure: CLIPPING OF LEFT  ATRIAL APPENDAGE - USING ATRICLIP SIZE 40;  Surgeon: Ivin Poot, MD;   Location: [place];  Service: Open Heart Surgery;  Laterality: N/A; . COLONOSCOPY N/A 08/25/2017  Procedure: COLONOSCOPY;  Surgeon: Rogene Houston, MD;  [facility];  Service: Endoscopy;  Laterality: N/A; . CORONARY ARTERY BYPASS GRAFT N/A 03/16/2019  Procedure: CORONARY ARTERY BYPASS GRAFTING (CABG), ON PUMP, TIMES THREE, USING LEFT INTERNAL MAMMARY ARTERY, RIGHT GREATER SAPHENOUS VEIN HARVESTED ENDOSCOPICALLY;  Surgeon: Ivin Poot, MD;  Location: [place];  Service: Open Heart Surgery;  Laterality: N/A;  swan only . CYSTOSCOPY WITH FULGERATION N/A 04/30/2019  Procedure: CYSTOSCOPY WITH FULGERATION;  Surgeon: Irine Seal, MD;  Location: [place];  Service: Urology;  Laterality: N/A; . HERNIA REPAIR   . IR FLUORO GUIDE CV LINE LEFT  04/23/2019 . IR US GUIDE VASC ACCESS LEFT  04/23/2019 . MAZE N/A 03/16/2019  Procedure: MAZE;  Surgeon: Ivin Poot, MD;  Location: [place];  Service: Open Heart Surgery;  Laterality: N/A; . PLACEMENT OF IMPELLA LEFT VENTRICULAR ASSIST DEVICE Right 03/27/2019  Procedure: Placement Of Impella Left Ventricular Assist Device using ABIOMED Impella 5.5 with SmartAssist Device.;  Surgeon: Wonda Olds, MD;  [facility];  Service: Thoracic;  Laterality: Right; . PROSTATE SURGERY   . REMOVAL OF IMPELLA LEFT VENTRICULAR ASSIST DEVICE N/A 04/10/2019  Procedure: REMOVAL OF IMPELLA LEFT VENTRICULAR ASSIST DEVICE WITH INSERTION OF RIGHT FEMORAL ARTERIAL LINE;  Surgeon: Ivin Poot, MD;  Location: [place];  Service: Open Heart Surgery;  Laterality: N/A; . RIGHT/LEFT HEART CATH AND CORONARY  ANGIOGRAPHY N/A 03/15/2019  Procedure: RIGHT/LEFT HEART CATH AND CORONARY ANGIOGRAPHY;  Surgeon: Burnell Blanks, MD;  Location: [place] CV LAB;  Service: Cardiovascular;  Laterality: N/A; . TEE WITHOUT CARDIOVERSION N/A 03/16/2019  Procedure: TRANSESOPHAGEAL ECHOCARDIOGRAM (TEE);  Surgeon: Prescott Gum, Collier Salina, MD;  Location: [place];  Service: Open Heart Surgery;  Laterality: N/A; . TRACHEOSTOMY  TUBE PLACEMENT N/A 03/27/2019  Procedure: TRACHEOSTOMY placed using Shiley 8DCT Cuffed.;  Surgeon: Wonda Olds, MD;  Location[facility];  Service: Thoracic;  Laterality: N/A; . TRANSURETHRAL RESECTION OF BLADDER NECK N/A 05/13/2019  Procedure: CYSTOSCOPY CLOT EVACUATION FULGRATION CYSTOGRAM AND INSTILLATION OF FORMALIN;  Surgeon: Lucas Mallow, MD;  Location: [place];  Service: Urology;  Laterality: N/A; HPI: 86 y.o. male with PMH: h/o CAD s/p CABG and clipping of left atrial appendage, who presented with SOB on 2/1. He became hypoxic on 2/2 with pulmonary edema and needed to be intubated on 2/2 and extubated 2/5. Reintubated 2/8.  Pt underwent impella placement and tracheostomy on 2/9. Pt started on CRRT 2/11. Impella removal 2/23. Pt decannulated 3/13.  MBS 3/16 with penetration of nectar to the vocal folds without sensation.   Subjective: pleasant, a bit sleepy Assessment / Plan / Recommendation CHL IP CLINICAL IMPRESSIONS 05/15/2019 Clinical Impression Pt presents with improved oropharyngeal swallow function marked by functional oral control and better base-of-tongue propulsion, much less residue in pharynx post-swallow with better sensation of its presence. Pt continues to have delays in onset of laryngeal vestibule closure - this led, on one occasion, to moderate aspiration of thin liquids during the swallow.  When pt tucked his chin, duration of laryngeal vestibule closure was longer, allowing thin liquids to pass through pharynx and into UES without penetration nor aspiration.  This posture was repeated multiple times to ensure success.  Recommend advancing diet to dysphagia 2, thin liquids - pt may use straw to allow him to tuck chin in advance of sipping drinks.  RN present for study; results/recs reviewed.  SLP Visit Diagnosis Dysphagia, oropharyngeal phase (R13.12) Attention and concentration deficit following -- Frontal lobe and executive function deficit following -- Impact on safety and function  Mild aspiration risk   CHL IP TREATMENT RECOMMENDATION 05/15/2019 Treatment Recommendations Therapy as outlined in treatment plan below   Prognosis 05/15/2019 Prognosis for Safe Diet Advancement Good Barriers to Reach Goals -- Barriers/Prognosis Comment -- [facility] IP DIET RECOMMENDATION 05/15/2019 SLP Diet Recommendations Dysphagia 2 (Fine chop) solids;Thin liquid Liquid Administration via Straw;Cup Medication Administration Whole meds with puree Compensations Chin tuck;Slow rate Postural Changes --   CHL IP OTHER RECOMMENDATIONS 05/15/2019 Recommended Consults -- Oral Care Recommendations Oral care BID Other Recommendations --   CH[facility] IP FOLLOW UP RECOMMENDATIONS 05/15/2019 Follow up Recommendations Skilled Nursing facility   [facility] IP FREQUENCY AND DURATION 05/15/2019 Speech Therapy Frequency (ACUTE ONLY) min 3x week Treatment Duration 2 weeks      CHL IP ORAL PHASE 05/15/2019 Oral Phase WFL Oral - Pudding Teaspoon -- Oral - Pudding Cup -- Oral - Honey Teaspoon -- Oral - Honey Cup -- Oral - Nectar Teaspoon -- Oral - Nectar Cup -- Oral - Nectar Straw -- Oral - Thin Teaspoon -- Oral - Thin Cup -- Oral - Thin Straw -- Oral - Puree -- Oral - Mech Soft -- Oral - Regular -- Oral - Multi-Consistency -- Oral - Pill -- Oral Phase - Comment --  CHL IP PHARYNGEAL PHASE 05/15/2019 Pharyngeal Phase Impaired Pharyngeal- Pudding Teaspoon -- Pharyngeal -- Pharyngeal- Pudding Cup -- Pharyngeal --  Pharyngeal- Honey Teaspoon -- Pharyngeal -- Pharyngeal- Honey Cup NT Pharyngeal -- Pharyngeal- Nectar Teaspoon -- Pharyngeal -- Pharyngeal- Nectar Cup NT Pharyngeal -- Pharyngeal- Nectar Straw NT Pharyngeal -- Pharyngeal- Thin Teaspoon -- Pharyngeal -- Pharyngeal- Thin Cup NT Pharyngeal -- Pharyngeal- Thin Straw Delayed swallow initiation-pyriform sinuses;Delayed swallow initiation-vallecula;Penetration/Aspiration during swallow;Moderate aspiration Pharyngeal Material enters airway, passes BELOW cords and not ejected out despite cough attempt by patient  Pharyngeal- Puree Delayed swallow initiation-vallecula;Pharyngeal residue - valleculae Pharyngeal Material does not enter airway Pharyngeal- Mechanical Soft -- Pharyngeal -- Pharyngeal- Regular -- Pharyngeal -- Pharyngeal- Multi-consistency -- Pharyngeal -- Pharyngeal- Pill -- Pharyngeal -- Pharyngeal Comment --  CHL IP CERVICAL ESOPHAGEAL PHASE 05/01/2019 Cervical Esophageal Phase WFL Pudding Teaspoon -- Pudding Cup -- Honey Teaspoon -- Honey Cup -- Nectar Teaspoon -- Nectar Cup -- Nectar Straw -- Thin Teaspoon -- Thin Cup -- Thin Straw -- Puree -- Mechanical Soft -- Regular -- Multi-consistency -- Pill -- Cervical Esophageal Comment -- Juan Quam Laurice 05/15/2019, 2:48 PM                Assessment and Plan: Hemorrhagic radiation cystitis.  His urine remains clear following formalin instillation.  The foley remains out and he is incontinent.  With the scrotal edema a condom cath would not be feasible.  A Purewick with a large tegaderm to hold it in place might be worth considering or possibly a urostomy bag.       LOS: 63 days    Irine Seal 05/16/2019 [phone] ID: [name], male   DOB: Nov 11, 1928, 86 y.o.   MRN: [id number]

## 2019-05-16 NOTE — Plan of Care (Signed)
  Problem: Education: Goal: Knowledge of General Education information will improve Description: Including pain rating scale, medication(s)/side effects and non-pharmacologic comfort measures Outcome: Progressing   Problem: Health Behavior/Discharge Planning: Goal: Ability to manage health-related needs will improve Outcome: Progressing   Problem: Clinical Measurements: Goal: Ability to maintain clinical measurements within normal limits will improve Outcome: Progressing Goal: Will remain free from infection Outcome: Progressing Goal: Diagnostic test results will improve Outcome: Progressing Goal: Respiratory complications will improve Outcome: Progressing Goal: Cardiovascular complication will be avoided Outcome: Progressing   Problem: Activity: Goal: Risk for activity intolerance will decrease Outcome: Progressing   Problem: Nutrition: Goal: Adequate nutrition will be maintained Outcome: Progressing   Problem: Coping: Goal: Level of anxiety will decrease Outcome: Progressing   Problem: Elimination: Goal: Will not experience complications related to urinary retention Outcome: Progressing   Problem: Pain Managment: Goal: General experience of comfort will improve Outcome: Progressing   Problem: Safety: Goal: Ability to remain free from injury will improve Outcome: Progressing   Problem: Skin Integrity: Goal: Risk for impaired skin integrity will decrease Outcome: Progressing   Problem: Education: Goal: Knowledge of disease or condition will improve Outcome: Progressing Goal: Understanding of medication regimen will improve Outcome: Progressing Goal: Individualized Educational Video(s) Outcome: Progressing   Problem: Activity: Goal: Ability to tolerate increased activity will improve Outcome: Progressing   Problem: Cardiac: Goal: Ability to achieve and maintain adequate cardiopulmonary perfusion will improve Outcome: Progressing   Problem: Health  Behavior/Discharge Planning: Goal: Ability to safely manage health-related needs after discharge will improve Outcome: Progressing   Problem: Education: Goal: Understanding of CV disease, CV risk reduction, and recovery process will improve Outcome: Progressing Goal: Individualized Educational Video(s) Outcome: Progressing   Problem: Activity: Goal: Ability to return to baseline activity level will improve Outcome: Progressing   Problem: Cardiovascular: Goal: Ability to achieve and maintain adequate cardiovascular perfusion will improve Outcome: Progressing Goal: Vascular access site(s) Level 0-1 will be maintained Outcome: Progressing   Problem: Health Behavior/Discharge Planning: Goal: Ability to safely manage health-related needs after discharge will improve Outcome: Progressing   Problem: Education: Goal: Knowledge of disease and its progression will improve Outcome: Progressing   Problem: Health Behavior/Discharge Planning: Goal: Ability to manage health-related needs will improve Outcome: Progressing   Problem: Clinical Measurements: Goal: Complications related to the disease process or treatment will be avoided or minimized Outcome: Progressing Goal: Dialysis access will remain free of complications Outcome: Progressing   Problem: Activity: Goal: Activity intolerance will improve Outcome: Progressing   Problem: Fluid Volume: Goal: Fluid volume balance will be maintained or improved Outcome: Progressing   Problem: Nutritional: Goal: Ability to make appropriate dietary choices will improve Outcome: Progressing   Problem: Respiratory: Goal: Respiratory symptoms related to disease process will be avoided Outcome: Progressing   Problem: Self-Concept: Goal: Body image disturbance will be avoided or minimized Outcome: Progressing   Problem: Urinary Elimination: Goal: Progression of disease will be identified and treated Outcome: Progressing   Problem:  Education: Goal: Will demonstrate proper wound care and an understanding of methods to prevent future damage Outcome: Progressing Goal: Knowledge of disease or condition will improve Outcome: Progressing Goal: Knowledge of the prescribed therapeutic regimen will improve Outcome: Progressing Goal: Individualized Educational Video(s) Outcome: Progressing   Problem: Activity: Goal: Risk for activity intolerance will decrease Outcome: Progressing   Problem: Cardiac: Goal: Will achieve and/or maintain hemodynamic stability Outcome: Progressing   Problem: Clinical Measurements: Goal: Postoperative complications will be avoided or minimized Outcome: Progressing

## 2019-05-17 LAB — CBC
HCT: 37.4 % — ABNORMAL LOW (ref 39.0–52.0)
Hemoglobin: 11.4 g/dL — ABNORMAL LOW (ref 13.0–17.0)
MCH: 28.4 pg (ref 26.0–34.0)
MCHC: 30.5 g/dL (ref 30.0–36.0)
MCV: 93.3 fL (ref 80.0–100.0)
Platelets: 77 10*3/uL — ABNORMAL LOW (ref 150–400)
RBC: 4.01 MIL/uL — ABNORMAL LOW (ref 4.22–5.81)
RDW: 19.6 % — ABNORMAL HIGH (ref 11.5–15.5)
WBC: 6.6 10*3/uL (ref 4.0–10.5)
nRBC: 0 % (ref 0.0–0.2)

## 2019-05-17 LAB — GLUCOSE, CAPILLARY
Glucose-Capillary: 106 mg/dL — ABNORMAL HIGH (ref 70–99)
Glucose-Capillary: 126 mg/dL — ABNORMAL HIGH (ref 70–99)
Glucose-Capillary: 132 mg/dL — ABNORMAL HIGH (ref 70–99)
Glucose-Capillary: 102 mg/dL — ABNORMAL HIGH (ref 70–99)

## 2019-05-17 LAB — RENAL FUNCTION PANEL
Albumin: 2.1 g/dL — ABNORMAL LOW (ref 3.5–5.0)
Anion gap: 10 (ref 5–15)
BUN: 74 mg/dL — ABNORMAL HIGH (ref 8–23)
CO2: 27 mmol/L (ref 22–32)
Calcium: 8 mg/dL — ABNORMAL LOW (ref 8.9–10.3)
Chloride: 100 mmol/L (ref 98–111)
Creatinine, Ser: 3.06 mg/dL — ABNORMAL HIGH (ref 0.61–1.24)
GFR calc Af Amer: 20 mL/min — ABNORMAL LOW (ref >60)
GFR calc non Af Amer: 17 mL/min — ABNORMAL LOW (ref >60)
Glucose, Bld: 131 mg/dL — ABNORMAL HIGH (ref 70–99)
Phosphorus: 4.7 mg/dL — ABNORMAL HIGH (ref 2.5–4.6)
Potassium: 4.5 mmol/L (ref 3.5–5.1)
Sodium: 137 mmol/L (ref 135–145)

## 2019-05-17 LAB — MAGNESIUM: Magnesium: 2.1 mg/dL (ref 1.7–2.4)

## 2019-05-17 MED ORDER — METOCLOPRAMIDE HCL 10 MG PO TABS
5.0000 mg | ORAL_TABLET | Freq: Three times a day (TID) | ORAL | Status: DC
  Administered 2019-05-17 – 2019-05-20 (×11): 5 mg via ORAL
  Filled 2019-05-17 (×11): qty 1

## 2019-05-17 MED ORDER — CHLORHEXIDINE GLUCONATE CLOTH 2 % EX PADS
6.0000 | MEDICATED_PAD | Freq: Every day | CUTANEOUS | Status: DC
  Administered 2019-05-17 – 2019-05-19 (×3): 6 via TOPICAL

## 2019-05-17 NOTE — Progress Notes (Addendum)
Patient ID: Jesse Murphy, male   DOB: February 03, 1929, 86 y.o.   MRN: [id number]    Advanced Heart Failure Rounding Note  PCP-Cardiologist: Kate Sable, MD   Subjective:    Events - Impella removed 2/23. Intraoperative TEE showed EF ~40% - Had tunneled cath placed 3/9 and left PleurX tube.  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 Emergent TVP placed - went to OR for repeat cystoscopy, fulgeration and formalin installation in bladder  Now HD dependent. On MWF schedule. Remains anuric.   Sitting up in bed. No cardiac complaints.   Getting TFs.   Objective:   Weight Range: 85.7 kg Body mass index is 28.73 kg/m.   Vital Signs:   Temp:  [97.4 F (36.3 C)-98.1 F (36.7 C)] 98.1 F (36.7 C) (04/01 0744) Pulse Rate:  [70-81] 81 (04/01 0744) Resp:  [13-20] 17 (04/01 0744) BP: (83-146)/(18-60) 133/50 (04/01 0744) SpO2:  [96 %-100 %] 96 % (04/01 0744) Weight:  [85.5 kg-87 kg] 85.7 kg (04/01 0500) Last BM Date: 05/14/19  Weight change: Filed Weights   05/16/19 1300 05/16/19 1715 05/17/19 0500  Weight: 87 kg 85.5 kg 85.7 kg    Intake/Output:   Intake/Output Summary (Last 24 hours) at 05/17/2019 0826 Last data filed at 05/16/2019 2300 Gross per 24 hour  Intake 120 ml  Output 1500 ml  Net -1380 ml     Physical Exam   PHYSICAL EXAM: General:  Chronically ill appearing elderly WM. No respiratory difficulty HEENT: normal + NGT anicteric  Neck: supple.  JVP 6-7. Carotids 2+ bilat; no bruits. No lymphadenopathy or thyromegaly appreciated. Cor: PMI nondisplaced. Regular rate & rhythm. No rubs, gallops or murmurs. Lungs: clear no wheeze Abdomen: soft, nontender, nondistended. No hepatosplenomegaly. No bruits or masses. Good bowel sounds. Extremities: no cyanosis, clubbing, rash, edema + ted  hose Neuro: alert & oriented x 3, cranial nerves grossly intact. moves all 4 extremities w/o difficulty. Affect pleasant     Telemetry   Sinus 70-80s Personally reviewed   Labs    CBC Recent Labs    05/16/19 0532 05/17/19 0618  WBC 7.1 6.6  HGB 11.2* 11.4*  HCT 36.4* 37.4*  MCV 93.8 93.3  PLT 78* 77*   Basic Metabolic Panel Recent Labs    05/16/19 0532 05/17/19 0238  NA 135 137  K 5.1 4.5  CL 96* 100  CO2 26 27  GLUCOSE 126* 131*  BUN 153* 74*  CREATININE 5.19* 3.06*  CALCIUM 8.3* 8.0*  MG 2.5* 2.1  PHOS 6.4* 4.7*   Liver Function Tests Recent Labs    05/16/19 0532 05/17/19 0238  ALBUMIN 2.2* 2.1*   No results for input(s): LIPASE, AMYLASE in the last 72 hours. Cardiac Enzymes No results for input(s): CKTOTAL, CKMB, CKMBINDEX, TROPONINI in the last 72 hours.  BNP: BNP (last 3 results) Recent Labs    03/22/19 0401 03/26/19 0626 03/27/19 1255  BNP 340.4* 687.5* 800.4*    ProBNP (last 3 results) No results for input(s): PROBNP in the last 8760 hours.   D-Dimer No results for input(s): DDIMER in the last 72 hours. Hemoglobin A1C No results for input(s): HGBA1C in the last 72 hours. Fasting Lipid Panel No results for input(s): CHOL, HDL, LDLCALC, TRIG, CHOLHDL, LDLDIRECT in the last 72 hours. Thyroid Function Tests No results for  input(s): TSH, T4TOTAL, T3FREE, THYROIDAB in the last 72 hours.  Invalid input(s): FREET3  Other results:   Imaging    No results found.   Medications:     Scheduled Medications: . sodium chloride   Intravenous Once  . aspirin  81 mg Oral Daily  . calcium acetate (Phos Binder)  667 mg Per Tube TID WC  . chlorhexidine  15 mL Mouth Rinse BID  . Chlorhexidine Gluconate Cloth  6 each Topical Daily  . Chlorhexidine Gluconate Cloth  6 each Topical Q0600  . Chlorhexidine Gluconate Cloth  6 each Topical Q0600  . famotidine  10 mg Per Tube Daily  . feeding supplement (NEPRO CARB STEADY)  237 mL Per Tube QID   . feeding supplement (PRO-STAT SUGAR FREE 64)  60 mL Per Tube TID  . Gerhardt's butt cream   Topical BID  . heparin  4,000 Units Intravenous Once  . hydrocortisone   Rectal BID  . hydrocortisone  25 mg Rectal BID  . insulin aspart  0-15 Units Subcutaneous TID WC  . insulin aspart  0-5 Units Subcutaneous QHS  . magic mouthwash  5 mL Oral TID  . mouth rinse  15 mL Mouth Rinse q12n4p  . multivitamin  1 tablet Per Tube QHS  . nutrition supplement (JUVEN)  1 packet Per Tube BID BM  . predniSONE  10 mg Per Tube Q breakfast  . simethicone  80 mg Oral TID  . sodium chloride flush  10-40 mL Intracatheter Q12H  . sodium chloride flush  10-40 mL Intracatheter Q12H    Infusions: . sodium chloride    . sodium chloride    . sodium chloride Stopped (05/08/19 1833)  . epinephrine Stopped (05/13/19 0800)    PRN Medications: sodium chloride, sodium chloride, oxyCODONE **AND** acetaminophen, dextrose, heparin, hydrOXYzine, hyoscyamine, levalbuterol, lidocaine (PF), lidocaine-prilocaine, loperamide HCl, ondansetron (ZOFRAN) IV, opium-belladonna, pentafluoroprop-tetrafluoroeth, promethazine, Resource ThickenUp Clear, sodium chloride flush, sodium chloride flush, sodium chloride flush    Assessment/Plan   1. Acute systolic HF -> Cardiogenic shock - post-op echo on 03/20/19 EF 25-30% (pre-op 30-35%. In 12/2017 EF normal) - Required Impella support post CABG. Impella removed 2/23. Post-op TEE EF 40% - Has developed ESRD. Volume controlled through HD - Weight stable - Continue iHD on MWF Schedule. Now off midodrine due to rash (unclear which medication caused). Can add back as needed - unable to tolerate GDMT with low BP and ESRD  2. CAD s/p CABG this admit (LIMA->LAD, SVG->OM, SVG->RCA on 03/15/19) - No s/s ischemia - Continue statin.  - Off ASA due to hematuria and BRBPR  3. Acute hypoxic respiratory failure with Pseudomonas PNA - s/p trach on 03/27/19 - completed meropenem for pseudomonas  PNA - trach aspirate 3/5 w/ regrowth of Pseudomonas again.   - Completed antibiotic course.  -Trach decannulated 3/13. Stable on Bruni - Stable no change   4. PAF - s/p MAZE and LAA occlusion - Remains in NSR. Off amio due to rash (unclear which agent it was) - Can reconsider AC in near future as bladder issues stabilize - no change for now  5. AKI -> ESRD - due to shock/ATN.  - Tunneled cath placed 3/9  - He is tolerating iHD. Midodrine now off.  - Vein mapping complete. Nephrology considering permanent access but no formal plans   6. Hematuria -Cystoscopy 3/15 with fulgeration - Back to OR 3/28 with fulgeration and formalin installation - No further bleeding. Hgb stable  7. Complete heart block with profound  symptomatic bradycardia - likely vagal in nature - s/p emergent TVP 3/25 - resolved TVP out.   8. Debility, severe - continue PT/OT - Hopefully to CIR soon.  9. F/E/N - Poor po intake.  - Continues w/ Cor-trak for TFs - Encourage po      10. Left Pleural Effusion  - moderate to large. - s/p PleurX on 3/8.  - TCTS managing. Draining M/Th  10. Diffuse rash - Appearance of drug induced rash. - Steroids started 3/21 -> much improved - Now tapering steroids. Decrease to prednisone 10 mg today - Can add needed meds back 1 by 1 watching for recurrent rash  11. Anemia: - Resolved - hgb 9.0 -> 7.9 -> 10.9 -> 11.2->11.4  12. Dysphagia and BRBPR - ? Possible esophagitis +/- radiation proctitis - GI following.Treated with IV fluconazole. Resolved  13. Hyperkalemia - resolved. K 4.5 today  - lokelma as needed on non HD days. - Renal following     Lyda Jester, PA-C  8:26 AM    Patient seen and examined with the above-signed Advanced Practice Provider and/or Housestaff. I personally reviewed laboratory data, imaging studies and relevant notes. I independently examined the patient and formulated the important aspects of the plan. I have edited the note to  reflect any of my changes or salient points. I have personally discussed the plan with the patient and/or family.  Tolerated HD yesterday. Volume status looks good. Still with some clear incontinence but no further hematuria. Remains in NSR. Not on AC due to hematuria.   Continue PT/OT.  Recommending CIR.   Glori Bickers, MD  4:01 PM

## 2019-05-17 NOTE — Progress Notes (Signed)
Inpatient Rehab Admissions Coordinator:    I have no beds available for this patient today.  Will continue to follow for timing of rehab admission pending bed availability.   Shann Medal, PT, DPT Admissions Coordinator [phone] 05/17/19  10:15 AM

## 2019-05-17 NOTE — Progress Notes (Signed)
Calorie Count Note  48 hour calorie count ordered.  Diet: DYS 2, thin liquids  Supplements: Magic Cup TID  Day 1 results:  Two meal tickets in envelope. Both documented as bites from each meal (no percentages). Not taking Magic Cup. Pt reports appetite is slowly progressing. Data does not reflect increasing PO intake. Complains of nausea and "vomiting" after meals.   Pt would benefit from long term feeding tube but is a poor candidate per TCTS.   Continue:  -1 carton Nepro four times daily  -60 ml Prostat TID  Provides:2280kcals,166 grams protein,685ml free water.  Mariana Single RD, LDN Clinical Nutrition Pager listed in [place]

## 2019-05-17 NOTE — Progress Notes (Addendum)
[address]       [employer] [id number]             [phone]      4 Days Post-Op Procedure(s) (LRB): CYSTOSCOPY CLOT EVACUATION FULGRATION CYSTOGRAM AND INSTILLATION OF FORMALIN (N/A) Subjective: Feels better, appetite slowly improved  Objective: Vital signs in last 24 hours: Temp:  [97.4 F (36.3 C)-98 F (36.7 C)] 97.4 F (36.3 C) (04/01 0421) Pulse Rate:  [70-78] 72 (04/01 0500) Cardiac Rhythm: Normal sinus rhythm;Bundle branch block (03/31 1900) Resp:  [13-25] 13 (04/01 0500) BP: (83-146)/(18-60) 124/44 (04/01 0421) SpO2:  [96 %-100 %] 99 % (04/01 0500) Weight:  [85.5 kg-87 kg] 85.7 kg (04/01 0500)  Hemodynamic parameters for last 24 hours:    Intake/Output from previous day: 03/31 0701 - 04/01 0700 In: 120 [NG/GT:120] Out: 1500  Intake/Output this shift: No intake/output data recorded.  General appearance: alert, cooperative and no distress Heart: regular rate and rhythm Lungs: coarse BS Abdomen: benign Extremities: trace edema Wound: incis healing well  Lab Results: Recent Labs    05/16/19 0532 05/17/19 0618  WBC 7.1 6.6  HGB 11.2* 11.4*  HCT 36.4* 37.4*  PLT 78* 77*   BMET:  Recent Labs    05/16/19 0532 05/17/19 0238  NA 135 137  K 5.1 4.5  CL 96* 100  CO2 26 27  GLUCOSE 126* 131*  BUN 153* 74*  CREATININE 5.19* 3.06*  CALCIUM 8.3* 8.0*    PT/INR: No results for input(s): LABPROT, INR in the last 72 hours. ABG    Component Value Date/Time   PHART 7.369 05/10/2019 1804   HCO3 20.0 05/10/2019 1804   TCO2 21 (L) 05/10/2019 1804   ACIDBASEDEF 5.0 (H) 05/10/2019 1804   O2SAT 99.0 05/10/2019 1804   CBG (last 3)  Recent Labs    05/16/19 1735 05/16/19 2153 05/17/19 0633  GLUCAP 136* 137* 106*    Meds Scheduled Meds: . sodium chloride   Intravenous Once  . aspirin  81 mg Oral Daily  . calcium acetate (Phos Binder)  667 mg Per Tube TID WC  . chlorhexidine  15 mL Mouth Rinse BID  . Chlorhexidine Gluconate  Cloth  6 each Topical Daily  . Chlorhexidine Gluconate Cloth  6 each Topical Q0600  . famotidine  10 mg Per Tube Daily  . feeding supplement (NEPRO CARB STEADY)  237 mL Per Tube QID  . feeding supplement (PRO-STAT SUGAR FREE 64)  60 mL Per Tube TID  . Gerhardt's butt cream   Topical BID  . heparin  4,000 Units Intravenous Once  . hydrocortisone   Rectal BID  . hydrocortisone  25 mg Rectal BID  . insulin aspart  0-15 Units Subcutaneous TID WC  . insulin aspart  0-5 Units Subcutaneous QHS  . magic mouthwash  5 mL Oral TID  . mouth rinse  15 mL Mouth Rinse q12n4p  . multivitamin  1 tablet Per Tube QHS  . nutrition supplement (JUVEN)  1 packet Per Tube BID BM  . patiromer  16.8 g Oral Daily  . predniSONE  10 mg Per Tube Q breakfast  . simethicone  80 mg Oral TID  . sodium chloride flush  10-40 mL Intracatheter Q12H  . sodium chloride flush  10-40 mL Intracatheter Q12H   Continuous Infusions: . sodium chloride    . sodium chloride    . sodium chloride Stopped (05/08/19 1833)  . epinephrine Stopped (05/13/19 0800)   PRN Meds:.sodium chloride,  sodium chloride, oxyCODONE **AND** acetaminophen, dextrose, heparin, hydrOXYzine, hyoscyamine, levalbuterol, lidocaine (PF), lidocaine-prilocaine, loperamide HCl, ondansetron (ZOFRAN) IV, opium-belladonna, pentafluoroprop-tetrafluoroeth, promethazine, Resource ThickenUp Clear, sodium chloride flush, sodium chloride flush, sodium chloride flush  Xrays DG Swallowing Func-Speech Pathology  Result Date: 05/15/2019 Objective Swallowing Evaluation: Type of Study: MBS-Modified Barium Swallow Study  Patient Details Name: Jesse Murphy MRN: [id number] Date of Birth: December 08, 1928 Today's Date: 05/15/2019 Time: SLP Start Time (ACUTE ONLY): 0865 -SLP Stop Time (ACUTE ONLY): 1300 SLP Time Calculation (min) (ACUTE ONLY): 15 min Past Medical History: Past Medical History: Diagnosis Date . Atrial fibrillation ([place])  . CAD (coronary artery disease)   a. s/p PCI in 1992 b.  Coronary CT in 01/2018 showing extensive coronary calcification; FFR indeterminate --> medical management pursued at that time as patient not interested in repeat cath . Cancer [facility])   prostate . Dyspnea  . Hypertension  Past Surgical History: Past Surgical History: Procedure Laterality Date . CHEST TUBE INSERTION Left 04/23/2019  Procedure: INSERTION PLEURAL DRAINAGE CATHETER TO DRAIN LEFT PLEURAL EFFUSION;  Surgeon: Ivin Poot, MD;  Location: [place];  Service: Thoracic;  Laterality: Left; . CLIPPING OF ATRIAL APPENDAGE N/A 03/16/2019  Procedure: CLIPPING OF LEFT  ATRIAL APPENDAGE - USING ATRICLIP SIZE 40;  Surgeon: Ivin Poot, MD;  Location: [place];  Service: Open Heart Surgery;  Laterality: N/A; . COLONOSCOPY N/A 08/25/2017  Procedure: COLONOSCOPY;  Surgeon: Rogene Houston, MD;  [facility];  Service: Endoscopy;  Laterality: N/A; . CORONARY ARTERY BYPASS GRAFT N/A 03/16/2019  Procedure: CORONARY ARTERY BYPASS GRAFTING (CABG), ON PUMP, TIMES THREE, USING LEFT INTERNAL MAMMARY ARTERY, RIGHT GREATER SAPHENOUS VEIN HARVESTED ENDOSCOPICALLY;  Surgeon: Ivin Poot, MD;  Location: [place];  Service: Open Heart Surgery;  Laterality: N/A;  swan only . CYSTOSCOPY WITH FULGERATION N/A 04/30/2019  Procedure: CYSTOSCOPY WITH FULGERATION;  Surgeon: Irine Seal, MD;  Location: [place];  Service: Urology;  Laterality: N/A; . HERNIA REPAIR   . IR FLUORO GUIDE CV LINE LEFT  04/23/2019 . IR US GUIDE VASC ACCESS LEFT  04/23/2019 . MAZE N/A 03/16/2019  Procedure: MAZE;  Surgeon: Ivin Poot, MD;  Location: [place];  Service: Open Heart Surgery;  Laterality: N/A; . PLACEMENT OF IMPELLA LEFT VENTRICULAR ASSIST DEVICE Right 03/27/2019  Procedure: Placement Of Impella Left Ventricular Assist Device using ABIOMED Impella 5.5 with SmartAssist Device.;  Surgeon: Wonda Olds, MD;  Location: [facility];  Service: Thoracic;  Laterality: Right; . PROSTATE SURGERY   . REMOVAL OF IMPELLA LEFT VENTRICULAR ASSIST DEVICE N/A  04/10/2019  Procedure: REMOVAL OF IMPELLA LEFT VENTRICULAR ASSIST DEVICE WITH INSERTION OF RIGHT FEMORAL ARTERIAL LINE;  Surgeon: Ivin Poot, MD;  Location: [place];  Service: Open Heart Surgery;  Laterality: N/A; . RIGHT/LEFT HEART CATH AND CORONARY ANGIOGRAPHY N/A 03/15/2019  Procedure: RIGHT/LEFT HEART CATH AND CORONARY ANGIOGRAPHY;  Surgeon: Burnell Blanks, MD;  Location: [place] CV [facility];  Service: Cardiovascular;  Laterality: N/A; . TEE WITHOUT CARDIOVERSION N/A 03/16/2019  Procedure: TRANSESOPHAGEAL ECHOCARDIOGRAM (TEE);  Surgeon: Prescott Gum, Collier Salina, MD;  Location: [place];  Service: Open Heart Surgery;  Laterality: N/A; . TRACHEOSTOMY TUBE PLACEMENT N/A 03/27/2019  Procedure: TRACHEOSTOMY placed using Shiley 8DCT Cuffed.;  Surgeon: Wonda Olds, MD;  Location: [facility];  Service: Thoracic;  Laterality: N/A; . TRANSURETHRAL RESECTION OF BLADDER NECK N/A 05/13/2019  Procedure: CYSTOSCOPY CLOT EVACUATION FULGRATION CYSTOGRAM AND INSTILLATION OF FORMALIN;  Surgeon: Lucas Mallow, MD;  Location: [place];  Service: Urology;  Laterality: N/A; HPI: 86 y.o. male with PMH: h/o CAD s/p CABG and clipping of left atrial appendage, who presented with SOB on 2/1. He became hypoxic on 2/2 with pulmonary edema and needed to be intubated on 2/2 and extubated 2/5. Reintubated 2/8.  Pt underwent impella placement and tracheostomy on 2/9. Pt started on CRRT 2/11. Impella removal 2/23. Pt decannulated 3/13.  MBS 3/16 with penetration of nectar to the vocal folds without sensation.   Subjective: pleasant, a bit sleepy Assessment / Plan / Recommendation CHL IP CLINICAL IMPRESSIONS 05/15/2019 Clinical Impression Pt presents with improved oropharyngeal swallow function marked by functional oral control and better base-of-tongue propulsion, much less residue in pharynx post-swallow with better sensation of its presence. Pt continues to have delays in onset of laryngeal vestibule closure - this led, on one occasion, to  moderate aspiration of thin liquids during the swallow.  When pt tucked his chin, duration of laryngeal vestibule closure was longer, allowing thin liquids to pass through pharynx and into UES without penetration nor aspiration.  This posture was repeated multiple times to ensure success.  Recommend advancing diet to dysphagia 2, thin liquids - pt may use straw to allow him to tuck chin in advance of sipping drinks.  RN present for study; results/recs reviewed.  SLP Visit Diagnosis Dysphagia, oropharyngeal phase (R13.12) Attention and concentration deficit following -- Frontal lobe and executive function deficit following -- Impact on safety and function Mild aspiration risk   CHL IP TREATMENT RECOMMENDATION 05/15/2019 Treatment Recommendations Therapy as outlined in treatment plan below   Prognosis 05/15/2019 Prognosis for Safe Diet Advancement Good Barriers to Reach Goals -- Barriers/Prognosis Comment -- CHL IP DIET RECOMMENDATION 05/15/2019 SLP Diet Recommendations Dysphagia 2 (Fine chop) solids;Thin liquid Liquid Administration via Straw;Cup Medication Administration Whole meds with puree Compensations Chin tuck;Slow rate Postural Changes --   CHL IP OTHER RECOMMENDATIONS 05/15/2019 Recommended Consults -- Oral Care Recommendations Oral care BID Other Recommendations --   CHL IP FOLLOW UP RECOMMENDATIONS 05/15/2019 Follow up Recommendations Skilled Nursing facility   [facility] IP FREQUENCY AND DURATION 05/15/2019 Speech Therapy Frequency (ACUTE ONLY) min 3x week Treatment Duration 2 weeks      CHL IP ORAL PHASE 05/15/2019 Oral Phase WFL Oral - Pudding Teaspoon -- Oral - Pudding Cup -- Oral - Honey Teaspoon -- Oral - Honey Cup -- Oral - Nectar Teaspoon -- Oral - Nectar Cup -- Oral - Nectar Straw -- Oral - Thin Teaspoon -- Oral - Thin Cup -- Oral - Thin Straw -- Oral - Puree -- Oral - Mech Soft -- Oral - Regular -- Oral - Multi-Consistency -- Oral - Pill -- Oral Phase - Comment --  CHL IP PHARYNGEAL PHASE 05/15/2019 Pharyngeal  Phase Impaired Pharyngeal- Pudding Teaspoon -- Pharyngeal -- Pharyngeal- Pudding Cup -- Pharyngeal -- Pharyngeal- Honey Teaspoon -- Pharyngeal -- Pharyngeal- Honey Cup NT Pharyngeal -- Pharyngeal- Nectar Teaspoon -- Pharyngeal -- Pharyngeal- Nectar Cup NT Pharyngeal -- Pharyngeal- Nectar Straw NT Pharyngeal -- Pharyngeal- Thin Teaspoon -- Pharyngeal -- Pharyngeal- Thin Cup NT Pharyngeal -- Pharyngeal- Thin Straw Delayed swallow initiation-pyriform sinuses;Delayed swallow initiation-vallecula;Penetration/Aspiration during swallow;Moderate aspiration Pharyngeal Material enters airway, passes BELOW cords and not ejected out despite cough attempt by patient Pharyngeal- Puree Delayed swallow initiation-vallecula;Pharyngeal residue - valleculae Pharyngeal Material does not enter airway Pharyngeal- Mechanical Soft -- Pharyngeal -- Pharyngeal- Regular -- Pharyngeal -- Pharyngeal- Multi-consistency -- Pharyngeal -- Pharyngeal- Pill -- Pharyngeal -- Pharyngeal Comment --  CHL IP CERVICAL ESOPHAGEAL PHASE 05/01/2019 Cervical Esophageal Phase WFL Pudding Teaspoon --  Pudding Cup -- Honey Teaspoon -- Honey Cup -- Nectar Teaspoon -- Nectar Cup -- Nectar Straw -- Thin Teaspoon -- Thin Cup -- Thin Straw -- Puree -- Mechanical Soft -- Regular -- Multi-consistency -- Pill -- Cervical Esophageal Comment -- Juan Quam Laurice 05/15/2019, 2:48 PM               Assessment/Plan: S/P Procedure(s) (LRB): CYSTOSCOPY CLOT EVACUATION FULGRATION CYSTOGRAM AND INSTILLATION OF FORMALIN (N/A)  1 steady progress 2 hemodyn stable, sinus rhythm 3 sats good on 3 liters 4 conts renal management/HD per nephrology, making some urine 5 D2 diet, hopefully can come off TF's soon when intake improves 6 AHF team conts t omanage systolic HF, very stable 7 hopefully to CIR soon    LOS: 64 days    John Giovanni PA-C Pager [phone] 05/17/2019   Platelet count has dropped significantly since heparin was added to his HD.  HIT screen and  serotonin release assay will be sent off.  Patient should have no more heparin for HD until HIT is ruled out.  Continue to push oral intake so that feeding tube can come out  Drain left Pleurx catheter today, follow-up chest x-ray tomorrow  Appreciate CIR eval- medically ready for trnsfer  patient examined and medical record reviewed,agree with above note. Tharon Aquas Trigt III 05/17/2019

## 2019-05-17 NOTE — H&P (Signed)
Physical Medicine and Rehabilitation Admission H&P    Chief Complaint  Patient presents with  . Tachycardia  : HPI: Jesse Murphy is an 86 year old right-handed male with history of CAD, chronic atrial fibrillation on Xarelto as well as non-Hodgkin's lymphoma, chronic anemia, hypertension, diastolic congestive heart failure, prostate cancer.  Per chart review patient lives with spouse in [place].  Independent prior to admission.  Wife uses a cane.  Local children check on him as needed.  Presented to [facility] 03/13/2019 with tachycardia as well as vague associated chest discomfort and palpitations.  Initially heart rate 200 bpm was given intravenous Cardizem.  Initial labs hemoglobin 11.3, platelets 187,000, sodium 138, potassium 3.9, creatinine 0.96 initial at bedtime troponin 32 with repeat values of 440, 926 and 585.  Chest x-ray showed no evidence of consolidation or effusion.  EKG showed atrial fibrillation with RVR heart rate 145 with no left bundle branch block.  DCCV was attempted in the ED but he remained in atrial fibrillation.  Echocardiogram showed ejection fraction of 35% as compared to previous echoes 55 to 60% 12/2017.  He was transferred to [facility] for further evaluation.  Cardiology service follow-up and patient with cardiac catheterization with LM/LAD and RCA disease.  Patient underwent CABG x3 left atrial Maze procedure 03/17/2019 per Dr. Darcey Nora.  Impella removed 04/10/2019 patient did require long-term intubation ultimately due to acute hypoxic respiratory failure tracheostomy tube completed 03/27/2019 he was decannulated 04/28/2019 stable nasal cannula.  Patient did have left pleural effusion necessitating need for Pleurx catheter 04/23/2019.  He developed a rash felt to be possibly drug-induced responded well to prednisone taper.  Nephrology consulted 03/29/2019 for AKI with creatinine 4.43 from baseline 1.7.  Patient ultimately required hemodialysis and  currently remains on schedule Monday Wednesday Friday.  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  Palliative care has been consulted to establish goals of care.  Urology services consulted 04/04/2019 for urinary retention as well as hemorrhagic cystitis underwent cystogram 05/13/2019 a small to moderate amount of clot was evacuated.  Ongoing bouts of anemia transfused as needed latest hemoglobin 11, platelets 99,000 felt to be induced by heparin during hemodialysis with HIT screen and serotonin release results pending.  Patient would have no more heparin for HD until HIT is ruled out.Marland Kitchen  He currently on a dysphagia #2 thin liquid diet as well as nasogastric tube for nutritional support.  Therapy evaluations completed and patient was admitted for a comprehensive rehab progra.  Pt reports he's NPO but he's on diet.  Having itching from recent rash/reaction to ABX- is slightly better.  Denies pain LBM this AM.  On O2 by [place]- wasn't at home.  Gets very fatigued from HD.  Review of Systems  Constitutional: Positive for diaphoresis. Negative for chills and fever.  Eyes: Negative for double vision.  Respiratory: Positive for shortness of breath.   Cardiovascular: Positive for chest pain, palpitations and leg swelling.  Gastrointestinal: Positive for blood in stool and constipation. Negative for heartburn, nausea and vomiting.  Genitourinary: Positive for hematuria. Negative for dysuria and flank pain.  Musculoskeletal: Positive for joint pain and myalgias.  All other systems reviewed and are negative.  Past Medical History:  Diagnosis Date  . Atrial fibrillation ([place])   . CAD (coronary artery disease)    a. s/p PCI in 1992 b. Coronary CT in 01/2018 showing extensive coronary calcification; FFR indeterminate -->  medical management pursued at that time as patient not interested in repeat cath  .  Cancer [facility])    prostate  . Dyspnea   . Hypertension    Past Surgical History:  Procedure Laterality Date  . CHEST TUBE INSERTION Left 04/23/2019   Procedure: INSERTION PLEURAL DRAINAGE CATHETER TO DRAIN LEFT PLEURAL EFFUSION;  Surgeon: Ivin Poot, MD;  Location: [place];  Service: Thoracic;  Laterality: Left;  . CLIPPING OF ATRIAL APPENDAGE N/A 03/16/2019   Procedure: CLIPPING OF LEFT  ATRIAL APPENDAGE - USING ATRICLIP SIZE 40;  Surgeon: Ivin Poot, MD;  Location: [place];  Service: Open Heart Surgery;  Laterality: N/A;  . COLONOSCOPY N/A 08/25/2017   Procedure: COLONOSCOPY;  Surgeon: Rogene Houston, MD;  [facility];  Service: Endoscopy;  Laterality: N/A;  . CORONARY ARTERY BYPASS GRAFT N/A 03/16/2019   Procedure: CORONARY ARTERY BYPASS GRAFTING (CABG), ON PUMP, TIMES THREE, USING LEFT INTERNAL MAMMARY ARTERY, RIGHT GREATER SAPHENOUS VEIN HARVESTED ENDOSCOPICALLY;  Surgeon: Ivin Poot, MD;  Location: [place];  Service: Open Heart Surgery;  Laterality: N/A;  swan only  . CYSTOSCOPY WITH FULGERATION N/A 04/30/2019   Procedure: CYSTOSCOPY WITH FULGERATION;  Surgeon: Irine Seal, MD;  Location: [place];  Service: Urology;  Laterality: N/A;  . HERNIA REPAIR    . IR FLUORO GUIDE CV LINE LEFT  04/23/2019  . IR US GUIDE VASC ACCESS LEFT  04/23/2019  . MAZE N/A 03/16/2019   Procedure: MAZE;  Surgeon: Ivin Poot, MD;  Location: [place];  Service: Open Heart Surgery;  Laterality: N/A;  . PLACEMENT OF IMPELLA LEFT VENTRICULAR ASSIST DEVICE Right 03/27/2019   Procedure: Placement Of Impella Left Ventricular Assist Device using ABIOMED Impella 5.5 with SmartAssist Device.;  Surgeon: Wonda Olds, MD;  [facility];  Service: Thoracic;  Laterality: Right;  . PROSTATE SURGERY    . REMOVAL OF IMPELLA LEFT VENTRICULAR ASSIST DEVICE N/A 04/10/2019   Procedure: REMOVAL OF IMPELLA LEFT VENTRICULAR ASSIST DEVICE WITH INSERTION OF RIGHT FEMORAL ARTERIAL LINE;  Surgeon: Ivin Poot,  MD;  Location: [place];  Service: Open Heart Surgery;  Laterality: N/A;  . RIGHT/LEFT HEART CATH AND CORONARY ANGIOGRAPHY N/A 03/15/2019   Procedure: RIGHT/LEFT HEART CATH AND CORONARY ANGIOGRAPHY;  Surgeon: Burnell Blanks, MD;  Location: [place] CV LAB;  Service: Cardiovascular;  Laterality: N/A;  . TEE WITHOUT CARDIOVERSION N/A 03/16/2019   Procedure: TRANSESOPHAGEAL ECHOCARDIOGRAM (TEE);  Surgeon: Prescott Gum, Collier Salina, MD;  Location: [place];  Service: Open Heart Surgery;  Laterality: N/A;  . TRACHEOSTOMY TUBE PLACEMENT N/A 03/27/2019   Procedure: TRACHEOSTOMY placed using Shiley 8DCT Cuffed.;  Surgeon: Wonda Olds, MD;  [facility];  Service: Thoracic;  Laterality: N/A;  . TRANSURETHRAL RESECTION OF BLADDER NECK N/A 05/13/2019   Procedure: CYSTOSCOPY CLOT EVACUATION FULGRATION CYSTOGRAM AND INSTILLATION OF FORMALIN;  Surgeon: Lucas Mallow, MD;  Location: [place];  Service: Urology;  Laterality: N/A;   Family History  Problem Relation Age of Onset  . CAD Mother 28  . CAD Father 49  . CAD Sister   . CAD Brother    Social History:  reports that he quit smoking about 29 years ago. His smoking use included cigarettes. He has a 12.50 pack-year smoking history. He has never used smokeless tobacco. He reports that he does not drink alcohol or use drugs. Allergies:  Allergies  Allergen Reactions  . Heparin Other (See Comments)    Thrombocytopenia/ HIT  Medications Prior to Admission  Medication Sig Dispense Refill  . acetaminophen (TYLENOL) 325 MG tablet Take 325 mg by mouth every 8 (eight) hours as needed for moderate pain.     Marland Kitchen atorvastatin (LIPITOR) 10 MG tablet Take 10 mg by mouth.     . fluticasone (FLONASE) 50 MCG/ACT nasal spray daily as needed.     . furosemide (LASIX) 20 MG tablet Take 20 mg by mouth daily.     Marland Kitchen loratadine (CLARITIN) 10 MG tablet Take 10 mg by mouth daily.    . metoprolol tartrate (LOPRESSOR) 50 MG tablet Take 1 tablet (50 mg total) by mouth 2  (two) times daily. 180 tablet 3  . pantoprazole (PROTONIX) 40 MG tablet Take 1 tablet (40 mg total) by mouth daily. 30 tablet 0  . Pediatric Multivitamins-Iron (FLINTSTONES PLUS IRON) chewable tablet Chew 1 tablet by mouth 2 (two) times daily.    . rivaroxaban (XARELTO) 20 MG TABS tablet Take 1 tablet (20 mg total) by mouth daily with supper.    . tamsulosin (FLOMAX) 0.4 MG CAPS capsule Take by mouth.      Drug Regimen Review Drug regimen was reviewed and remains appropriate with no significant issues identified  Home: Home Living Family/patient expects to be discharged to:: Private residence Living Arrangements: Spouse/significant other Available Help at Discharge: Family, Available 24 hours/day Type of Home: House Home Access: Stairs to enter Technical brewer of Steps: 4 Home Layout: One level Home Equipment: Other (comment) Additional Comments: lift chair   Functional History: Prior Function Level of Independence: Independent  Functional Status:  Mobility: Bed Mobility Overal bed mobility: Needs Assistance Bed Mobility: Sit to Supine Rolling: Min assist Supine to sit: Min assist Sit to supine: Mod assist Sit to sidelying: Min assist General bed mobility comments: Assist to lower trunk and bring legs back up into bed returning to supine Transfers Overall transfer level: Needs assistance Equipment used: 4-wheeled walker(EVA walker) Transfer via Lift Equipment: Stedy Transfers: Sit to/from Stand Sit to Stand: +2 safety/equipment, Max assist General transfer comment: Heavy assist to bring hips up and for balance.  Ambulation/Gait Ambulation/Gait assistance: +2 physical assistance, Mod assist Gait Distance (Feet): 3 Feet Assistive device: (EVA walker) Gait Pattern/deviations: Step-to pattern, Decreased step length - right, Decreased step length - left, Shuffle General Gait Details: Pt with knees wanting to buckle. Only amb from recliner back over to bed Gait  velocity: decr Gait velocity interpretation: <1.31 ft/sec, indicative of household ambulator    ADL: ADL Overall ADL's : Needs assistance/impaired Eating/Feeding: Set up, Sitting Grooming: Set up, Sitting Grooming Details (indicate cue type and reason): Pt denying need to stand for task after mobility task Upper Body Bathing: Moderate assistance, Sitting Lower Body Bathing: Maximal assistance, Sit to/from stand Lower Body Bathing Details (indicate cue type and reason): to wash legs after incontinence episode Upper Body Dressing : Moderate assistance, Sitting Lower Body Dressing: Maximal assistance, Sit to/from stand Toilet Transfer: Minimal assistance, +2 for physical assistance, +2 for safety/equipment, RW Toilet Transfer Details (indicate cue type and reason): to [facility] after incontinence episode Toileting- Clothing Manipulation and Hygiene: Maximal assistance, Sit to/from stand Functional mobility during ADLs: Minimal assistance, +2 for physical assistance, +2 for safety/equipment, Rolling walker(EVA walker) General ADL Comments: Pt increasing activity tolerance.   Cognition: Cognition Overall Cognitive Status: No family/caregiver present to determine baseline cognitive functioning Orientation Level: Oriented to person, Oriented to place, Oriented to situation, Disoriented to time Cognition Arousal/Alertness: Awake/alert Behavior During Therapy: Flat affect Overall Cognitive Status:  No family/caregiver present to determine baseline cognitive functioning Area of Impairment: Problem solving Problem Solving: Slow processing, Requires verbal cues General Comments: Pt light spirited and agreeable to therapy Difficult to assess due to: Tracheostomy  Physical Exam: Blood pressure (!) 133/50, pulse 81, temperature 98.1 F (36.7 C), temperature source Axillary, resp. rate 17, height 5' 7.99" (1.727 m), weight 85.7 kg, SpO2 96 %. Physical Exam  Nursing note and vitals  reviewed. Constitutional: He is oriented to person, place, and time.  Frail appearing older man laying in bed; supine, on O2 by [place] 1L- sats 96%, NAD; appropriate  HENT:  Nasogastric tube in place [name] in nares- O2 by [place] as well Rash fading esp from face esp No facial droop   Eyes: Conjunctivae are normal. No scleral icterus.  EOMI B/L No nystagmus  Neck: No tracheal deviation present.  Trach site almost healed over- - has dressing over it C/D/I  Cardiovascular:  RRR- no JVD seen  Respiratory: Effort normal. No stridor.  Pleurx catheter in place  Little coarse throughout- decreased breath sounds at bases L>R Juicy cough but not productive intermittently O2 by [place] 1L  GI:  Soft, NT, ND, (+) BS hypoactive   Genitourinary:    Genitourinary Comments: External male catheter with hematuria noted- has deep red wine colored urine in container on wall- connected to external catheter- per nurse, is chronic   Musculoskeletal:     Cervical back: Normal range of motion and neck supple.     Comments: UEs 4+/5 in deltoids, biceps, triceps, WE, grip and finger abd B/L RLE- HF 2/5, KE 2-/5, KF 3+/5, DF and PF 4+/5 LLE- HF 2+/5, KE 4-/5, KF 4-/5, DF 4/5, PF 4+/5  Wasn't able to turn over without nursing assistance  Neurological: He is alert and oriented to person, place, and time.  Patient is alert sitting up in chair.  Oriented x3 and follows commands. Sensation intact to light touch in all 4 extremities No clonus or homffan's noted B/L  Skin:  Midline chest incision clean and dry Blotchy erythematous rash on torso- esp chest and upper back- no pelvis area- Fading slightly- kept trying to scratch it 1-2+ UE swelling in hands, wrists and forearms B/L 2-3+ LE edema up to mid calves B/L Stage I nonblnachable around anus on horseshoe around buttocks 1 tiny spot- appears to be fraction, not pressure on R buttock- superficial Stage II? Slightly open?  Psychiatric: He has a normal mood and  affect.  Appropriate, calm, no anxiety    Results for orders placed or performed during the hospital encounter of 03/13/19 (from the past 48 hour(s))  Glucose, capillary     Status: Abnormal   Collection Time: 05/15/19 12:22 PM  Result Value Ref Range   Glucose-Capillary 113 (H) 70 - 99 mg/dL    Comment: Glucose reference range applies only to samples taken after fasting for at least 8 hours.  Glucose, capillary     Status: Abnormal   Collection Time: 05/15/19  4:19 PM  Result Value Ref Range   Glucose-Capillary 152 (H) 70 - 99 mg/dL    Comment: Glucose reference range applies only to samples taken after fasting for at least 8 hours.  Glucose, capillary     Status: Abnormal   Collection Time: 05/15/19  9:26 PM  Result Value Ref Range   Glucose-Capillary 151 (H) 70 - 99 mg/dL    Comment: Glucose reference range applies only to samples taken after fasting for at least 8 hours.  Renal  function panel (daily at 0500)     Status: Abnormal   Collection Time: 05/16/19  5:32 AM  Result Value Ref Range   Sodium 135 135 - 145 mmol/L   Potassium 5.1 3.5 - 5.1 mmol/L   Chloride 96 (L) 98 - 111 mmol/L   CO2 26 22 - 32 mmol/L   Glucose, Bld 126 (H) 70 - 99 mg/dL    Comment: Glucose reference range applies only to samples taken after fasting for at least 8 hours.   BUN 153 (H) 8 - 23 mg/dL   Creatinine, Ser 5.19 (H) 0.61 - 1.24 mg/dL   Calcium 8.3 (L) 8.9 - 10.3 mg/dL   Phosphorus 6.4 (H) 2.5 - 4.6 mg/dL   Albumin 2.2 (L) 3.5 - 5.0 g/dL   GFR calc non Af Amer 9 (L) >60 mL/min   GFR calc Af Amer 11 (L) >60 mL/min   Anion gap 13 5 - 15    Comment: Performed at [facility] [address], [place], [place] [address]  Magnesium     Status: Abnormal   Collection Time: 05/16/19  5:32 AM  Result Value Ref Range   Magnesium 2.5 (H) 1.7 - 2.4 mg/dL    Comment: Performed at [facility] [address] [place], [place] [address]  CBC     Status: Abnormal   Collection Time: 05/16/19   5:32 AM  Result Value Ref Range   WBC 7.1 4.0 - 10.5 K/uL   RBC 3.88 (L) 4.22 - 5.81 MIL/uL   Hemoglobin 11.2 (L) 13.0 - 17.0 g/dL   HCT 36.4 (L) 39.0 - 52.0 %   MCV 93.8 80.0 - 100.0 fL   MCH 28.9 26.0 - 34.0 pg   MCHC 30.8 30.0 - 36.0 g/dL   RDW 20.1 (H) 11.5 - 15.5 %   Platelets 78 (L) 150 - 400 K/uL    Comment: REPEATED TO VERIFY Immature Platelet Fraction may be clinically indicated, consider ordering this additional test [id number] CONSISTENT WITH PREVIOUS RESULT    nRBC 0.0 0.0 - 0.2 %    Comment: Performed at [facility], [place] [address] [place], [place] [address]  Glucose, capillary     Status: Abnormal   Collection Time: 05/16/19  6:30 AM  Result Value Ref Range   Glucose-Capillary 109 (H) 70 - 99 mg/dL    Comment: Glucose reference range applies only to samples taken after fasting for at least 8 hours.  Glucose, capillary     Status: Abnormal   Collection Time: 05/16/19 11:24 AM  Result Value Ref Range   Glucose-Capillary 111 (H) 70 - 99 mg/dL    Comment: Glucose reference range applies only to samples taken after fasting for at least 8 hours.   Comment 1 Notify RN    Comment 2 Document in Chart   Glucose, capillary     Status: Abnormal   Collection Time: 05/16/19  5:35 PM  Result Value Ref Range   Glucose-Capillary 136 (H) 70 - 99 mg/dL    Comment: Glucose reference range applies only to samples taken after fasting for at least 8 hours.   Comment 1 Notify RN    Comment 2 Document in Chart   Glucose, capillary     Status: Abnormal   Collection Time: 05/16/19  9:53 PM  Result Value Ref Range   Glucose-Capillary 137 (H) 70 - 99 mg/dL    Comment: Glucose reference range applies only to samples taken after fasting for at  least 8 hours.  Renal function panel (daily at 0500)     Status: Abnormal   Collection Time: 05/17/19  2:38 AM  Result Value Ref Range   Sodium 137 135 - 145 mmol/L   Potassium 4.5 3.5 - 5.1 mmol/L   Chloride 100 98 - 111 mmol/L   CO2 27  22 - 32 mmol/L   Glucose, Bld 131 (H) 70 - 99 mg/dL    Comment: Glucose reference range applies only to samples taken after fasting for at least 8 hours.   BUN 74 (H) 8 - 23 mg/dL   Creatinine, Ser 3.06 (H) 0.61 - 1.24 mg/dL    Comment: DIALYSIS   Calcium 8.0 (L) 8.9 - 10.3 mg/dL   Phosphorus 4.7 (H) 2.5 - 4.6 mg/dL   Albumin 2.1 (L) 3.5 - 5.0 g/dL   GFR calc non Af Amer 17 (L) >60 mL/min   GFR calc Af Amer 20 (L) >60 mL/min   Anion gap 10 5 - 15    Comment: Performed at [facility] [address] [place], [place] [address]  Magnesium     Status: None   Collection Time: 05/17/19  2:38 AM  Result Value Ref Range   Magnesium 2.1 1.7 - 2.4 mg/dL    Comment: Performed at [facility] [address], [place], [place] [address]  CBC     Status: Abnormal   Collection Time: 05/17/19  6:18 AM  Result Value Ref Range   WBC 6.6 4.0 - 10.5 K/uL   RBC 4.01 (L) 4.22 - 5.81 MIL/uL   Hemoglobin 11.4 (L) 13.0 - 17.0 g/dL   HCT 37.4 (L) 39.0 - 52.0 %   MCV 93.3 80.0 - 100.0 fL   MCH 28.4 26.0 - 34.0 pg   MCHC 30.5 30.0 - 36.0 g/dL   RDW 19.6 (H) 11.5 - 15.5 %   Platelets 77 (L) 150 - 400 K/uL    Comment: REPEATED TO VERIFY Immature Platelet Fraction may be clinically indicated, consider ordering this additional test [id number] CONSISTENT WITH PREVIOUS RESULT    nRBC 0.0 0.0 - 0.2 %    Comment: Performed at [facility], [place] [address] [place], [place] [address]  Glucose, capillary     Status: Abnormal   Collection Time: 05/17/19  6:33 AM  Result Value Ref Range   Glucose-Capillary 106 (H) 70 - 99 mg/dL    Comment: Glucose reference range applies only to samples taken after fasting for at least 8 hours.   DG Swallowing Func-Speech Pathology  Result Date: 05/15/2019 Objective Swallowing Evaluation: Type of Study: MBS-Modified Barium Swallow Study  Patient Details Name: TERRAN KLINKE MRN: [id number] Date of Birth: 1928-11-05 Today's Date: 05/15/2019 Time: SLP Start Time  (ACUTE ONLY): 2585 -SLP Stop Time (ACUTE ONLY): 1300 SLP Time Calculation (min) (ACUTE ONLY): 15 min Past Medical History: Past Medical History: Diagnosis Date . Atrial fibrillation ([place])  . CAD (coronary artery disease)   a. s/p PCI in 1992 b. Coronary CT in 01/2018 showing extensive coronary calcification; FFR indeterminate --> medical management pursued at that time as patient not interested in repeat cath . Cancer [facility])   prostate . Dyspnea  . Hypertension  Past Surgical History: Past Surgical History: Procedure Laterality Date . CHEST TUBE INSERTION Left 04/23/2019  Procedure: INSERTION PLEURAL DRAINAGE CATHETER TO DRAIN LEFT PLEURAL EFFUSION;  Surgeon: Ivin Poot, MD;  Location: [place];  Service: Thoracic;  Laterality: Left; . CLIPPING OF ATRIAL APPENDAGE N/A  03/16/2019  Procedure: CLIPPING OF LEFT  ATRIAL APPENDAGE - USING ATRICLIP SIZE 40;  Surgeon: Ivin Poot, MD;  Location: [place];  Service: Open Heart Surgery;  Laterality: N/A; . COLONOSCOPY N/A 08/25/2017  Procedure: COLONOSCOPY;  Surgeon: Rogene Houston, MD;  [facility];  Service: Endoscopy;  Laterality: N/A; . CORONARY ARTERY BYPASS GRAFT N/A 03/16/2019  Procedure: CORONARY ARTERY BYPASS GRAFTING (CABG), ON PUMP, TIMES THREE, USING LEFT INTERNAL MAMMARY ARTERY, RIGHT GREATER SAPHENOUS VEIN HARVESTED ENDOSCOPICALLY;  Surgeon: Ivin Poot, MD;  Location: [place];  Service: Open Heart Surgery;  Laterality: N/A;  swan only . CYSTOSCOPY WITH FULGERATION N/A 04/30/2019  Procedure: CYSTOSCOPY WITH FULGERATION;  Surgeon: Irine Seal, MD;  Location: [place];  Service: Urology;  Laterality: N/A; . HERNIA REPAIR   . IR FLUORO GUIDE CV LINE LEFT  04/23/2019 . IR US GUIDE VASC ACCESS LEFT  04/23/2019 . MAZE N/A 03/16/2019  Procedure: MAZE;  Surgeon: Ivin Poot, MD;  Location: [place];  Service: Open Heart Surgery;  Laterality: N/A; . PLACEMENT OF IMPELLA LEFT VENTRICULAR ASSIST DEVICE Right 03/27/2019  Procedure: Placement Of Impella Left  Ventricular Assist Device using ABIOMED Impella 5.5 with SmartAssist Device.;  Surgeon: Wonda Olds, MD;  [facility];  Service: Thoracic;  Laterality: Right; . PROSTATE SURGERY   . REMOVAL OF IMPELLA LEFT VENTRICULAR ASSIST DEVICE N/A 04/10/2019  Procedure: REMOVAL OF IMPELLA LEFT VENTRICULAR ASSIST DEVICE WITH INSERTION OF RIGHT FEMORAL ARTERIAL LINE;  Surgeon: Ivin Poot, MD;  Location: [place];  Service: Open Heart Surgery;  Laterality: N/A; . RIGHT/LEFT HEART CATH AND CORONARY ANGIOGRAPHY N/A 03/15/2019  Procedure: RIGHT/LEFT HEART CATH AND CORONARY ANGIOGRAPHY;  Surgeon: Burnell Blanks, MD;  Location: [place] [facility] LAB;  Service: Cardiovascular;  Laterality: N/A; . TEE WITHOUT CARDIOVERSION N/A 03/16/2019  Procedure: TRANSESOPHAGEAL ECHOCARDIOGRAM (TEE);  Surgeon: Prescott Gum, Collier Salina, MD;  Location: [place];  Service: Open Heart Surgery;  Laterality: N/A; . TRACHEOSTOMY TUBE PLACEMENT N/A 03/27/2019  Procedure: TRACHEOSTOMY placed using Shiley 8DCT Cuffed.;  Surgeon: Wonda Olds, MD;  [facility];  Service: Thoracic;  Laterality: N/A; . TRANSURETHRAL RESECTION OF BLADDER NECK N/A 05/13/2019  Procedure: CYSTOSCOPY CLOT EVACUATION FULGRATION CYSTOGRAM AND INSTILLATION OF FORMALIN;  Surgeon: Lucas Mallow, MD;  Location: [place];  Service: Urology;  Laterality: N/A; HPI: 86 y.o. male with PMH: h/o CAD s/p CABG and clipping of left atrial appendage, who presented with SOB on 2/1. He became hypoxic on 2/2 with pulmonary edema and needed to be intubated on 2/2 and extubated 2/5. Reintubated 2/8.  Pt underwent impella placement and tracheostomy on 2/9. Pt started on CRRT 2/11. Impella removal 2/23. Pt decannulated 3/13.  MBS 3/16 with penetration of nectar to the vocal folds without sensation.   Subjective: pleasant, a bit sleepy Assessment / Plan / Recommendation CHL IP CLINICAL IMPRESSIONS 05/15/2019 Clinical Impression Pt presents with improved oropharyngeal swallow function marked by  functional oral control and better base-of-tongue propulsion, much less residue in pharynx post-swallow with better sensation of its presence. Pt continues to have delays in onset of laryngeal vestibule closure - this led, on one occasion, to moderate aspiration of thin liquids during the swallow.  When pt tucked his chin, duration of laryngeal vestibule closure was longer, allowing thin liquids to pass through pharynx and into UES without penetration nor aspiration.  This posture was repeated multiple times to ensure success.  Recommend advancing diet to dysphagia 2, thin liquids - pt may  use straw to allow him to tuck chin in advance of sipping drinks.  RN present for study; results/recs reviewed.  SLP Visit Diagnosis Dysphagia, oropharyngeal phase (R13.12) Attention and concentration deficit following -- Frontal lobe and executive function deficit following -- Impact on safety and function Mild aspiration risk   [facility] IP TREATMENT RECOMMENDATION 05/15/2019 Treatment Recommendations Therapy as outlined in treatment plan below   Prognosis 05/15/2019 Prognosis for Safe Diet Advancement Good Barriers to Reach Goals -- Barriers/Prognosis Comment -- [facility] IP DIET RECOMMENDATION 05/15/2019 SLP Diet Recommendations Dysphagia 2 (Fine chop) solids;Thin liquid Liquid Administration via Straw;Cup Medication Administration Whole meds with puree Compensations Chin tuck;Slow rate Postural Changes --   [facility] IP OTHER RECOMMENDATIONS 05/15/2019 Recommended Consults -- Oral Care Recommendations Oral care BID Other Recommendations --   [facility] IP FOLLOW UP RECOMMENDATIONS 05/15/2019 Follow up Recommendations Skilled Nursing facility   [facility] IP FREQUENCY AND DURATION 05/15/2019 Speech Therapy Frequency (ACUTE ONLY) min 3x week Treatment Duration 2 weeks      [facility] IP ORAL PHASE 05/15/2019 Oral Phase WFL Oral - Pudding Teaspoon -- Oral - Pudding Cup -- Oral - Honey Teaspoon -- Oral - Honey Cup -- Oral - Nectar Teaspoon -- Oral - Nectar Cup -- Oral -  Nectar Straw -- Oral - Thin Teaspoon -- Oral - Thin Cup -- Oral - Thin Straw -- Oral - Puree -- Oral - Mech Soft -- Oral - Regular -- Oral - Multi-Consistency -- Oral - Pill -- Oral Phase - Comment --  [facility] IP PHARYNGEAL PHASE 05/15/2019 Pharyngeal Phase Impaired Pharyngeal- Pudding Teaspoon -- Pharyngeal -- Pharyngeal- Pudding Cup -- Pharyngeal -- Pharyngeal- Honey Teaspoon -- Pharyngeal -- Pharyngeal- Honey Cup NT Pharyngeal -- Pharyngeal- Nectar Teaspoon -- Pharyngeal -- Pharyngeal- Nectar Cup NT Pharyngeal -- Pharyngeal- Nectar Straw NT Pharyngeal -- Pharyngeal- Thin Teaspoon -- Pharyngeal -- Pharyngeal- Thin Cup NT Pharyngeal -- Pharyngeal- Thin Straw Delayed swallow initiation-pyriform sinuses;Delayed swallow initiation-vallecula;Penetration/Aspiration during swallow;Moderate aspiration Pharyngeal Material enters airway, passes BELOW cords and not ejected out despite cough attempt by patient Pharyngeal- Puree Delayed swallow initiation-vallecula;Pharyngeal residue - valleculae Pharyngeal Material does not enter airway Pharyngeal- Mechanical Soft -- Pharyngeal -- Pharyngeal- Regular -- Pharyngeal -- Pharyngeal- Multi-consistency -- Pharyngeal -- Pharyngeal- Pill -- Pharyngeal -- Pharyngeal Comment --  [facility] IP CERVICAL ESOPHAGEAL PHASE 05/01/2019 Cervical Esophageal Phase WFL Pudding Teaspoon -- Pudding Cup -- Honey Teaspoon -- Honey Cup -- Nectar Teaspoon -- Nectar Cup -- Nectar Straw -- Thin Teaspoon -- Thin Cup -- Thin Straw -- Puree -- Mechanical Soft -- Regular -- Multi-consistency -- Pill -- Cervical Esophageal Comment -- Juan Quam Laurice 05/15/2019, 2:48 PM                  Medical Problem List and Plan: 1.  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  Impella device removed 04/10/2019.  Sternal precautions  -patient may not shower due to L IJ for HD- unless way to cover  -ELOS/Goals: 2-2.5 weeks minimum- goals Supervision to CGA 2.  Antithrombotics: -DVT/anticoagulation: SCDs- due  to hematuria  -antiplatelet therapy: Aspirin 81 mg daily 3. Pain Management: Oxycodone as needed 4. Mood: Provide emotional support  -antipsychotic agents: N/A 5. Neuropsych: This patient is capable of making decisions on his own behalf. 6. Skin/Wound Care: Routine skin checks 7. Fluids/Electrolytes/Nutrition: Routine in and outs with follow-up chemistries 8.  Acute systolic congestive heart failure/cardiogenic shock.  Follow-up cardiology services 9.  Acute hypoxic respiratory failure with Pseudomonas pneumonia.  Status post tracheostomy 03/27/2019.  Decannulated  04/28/2019. 10.  Acute on chronic anemia/thrombocytopenia.  Follow-up CBC.   Serotonin release assay pending.  No more heparin for HD for now 11.  Dysphagia.  Dysphagia #2 thin liquids.  Follow-up speech therapy.  Nasogastric tube/cortrak for nutritional support.  Check calorie counts.  Dietary follow-up- see dietary note dated 4/6 for additional information. 12.  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  Patient has been transitioned to dialysis and being sent to OP HD after CIR, so is chronic and followed by renal services.  Tunneled L IJ catheter placed 04/24/2019 13.  Hematuria.  Cystoscopy completed with evacuation of clot.  No further bleeding episodes per notes, but has hematuria today- dark wine color hematuria- if doesn't stop, will call Urology back.  14.  Left pleural effusion.  Status post Pleurx catheter 04/23/2019.PATIENT WILL KEEP PLEUREX CATHETER FOR NOW PER CVTS.  Currently being drained Mondays and Thursdays 15.  Diffuse drug rash versus contact dermatitis.  Appears to be resolving.  Steroid taper as directed and completed- itching improved- con't supportive care. 16.  Rectal bleeding.  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  Follow-up GI with conservative care no further bleeding reported. 17.  PAF.  Patient remains normal sinus rhythm.  Currently off amiodarone.  No plan to resume any  anticoagulation at this time until hematuria fully resolved.   Lavon Paganini Angiulli, PA-C 05/17/2019   I have personally performed a face to face diagnostic evaluation of this patient and formulated the key components of the plan.  Additionally, I have personally reviewed laboratory data, imaging studies, as well as relevant notes and concur with the physician assistant's documentation above.   The patient's status has not changed from the original H&P.  Any changes in documentation from the acute care chart have been noted above.

## 2019-05-17 NOTE — Progress Notes (Signed)
[facility] ROUNDING NOTE   Subjective:   This is an 86 year old gentleman with a fairly long hospitalization developed acute systolic heart failure cardiogenic shock requiring Impella support post CABG postop ejection fraction 40% Impella removed 04/10/2019.  Status post CABG this admit.  History of paroxysmal atrial fibrillation status post maze LAA occlusion.  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 Gloriann Loan.  Now dialysis dependent since 04/08/2019.  Tolerated dialysis 05/16/2019 with removal of 1500 cc   Blood pressure 117/47 pulse 73 temperature 97.4  Sodium 137 potassium 4.5 chloride 100 CO2 27 BUN 74 creatinine 3 glucose 131 calcium 8 phosphorus 4.7 magnesium 2.1 albumin 2.1 hemoglobin 11.4 platelets 77  Aspirin 81 mg daily Phoslyra 660 mg 3 times daily,  Pepcid 10 mg daily 1, prednisone 10 mg daily, IV iron 125 mg Monday Wednesday Friday     Objective:  Vital signs in last 24 hours:  Temp:  [97.4 F (36.3 C)-98 F (36.7 C)] 97.4 F (36.3 C) (04/01 0421) Pulse Rate:  [70-78] 72 (04/01 0500) Resp:  [13-20] 13 (04/01 0500) BP: (83-146)/(18-60) 124/44 (04/01 0421) SpO2:  [97 %-100 %] 99 % (04/01 0500) Weight:  [85.5 kg-87 kg] 85.7 kg (04/01 0500)  Weight change:  Filed Weights   05/16/19 1300 05/16/19 1715 05/17/19 0500  Weight: 87 kg 85.5 kg 85.7 kg    Intake/Output: I/O last 3 completed shifts: In: 120 [NG/GT:120] Out: 1500 [Other:1500]   Intake/Output this shift:  No intake/output data recorded.  Gen: Comfortably resting in bed, being cleaned up by nurse. CVS: Pulse regular rhythm, normal rate, S1 and S2 normal Resp: Anteriorly clear to auscultation, no rales.  Left IJ TDC Abd: Soft, obese, nontender Ext: Trace dependent edema.   Basic Metabolic Panel: Recent Labs  Lab 05/13/19 0344 05/13/19 0344 05/14/19 0142 05/14/19 0142 05/14/19 1417 05/15/19 0526 05/16/19 0532  05/17/19 0238  NA 134*  --  134*  --   --  140 135 137  K 5.6*   < > 6.2*  --  3.4* 4.7 5.1 4.5  CL 95*  --  95*  --   --  102 96* 100  CO2 25  --  22  --   --  26 26 27   GLUCOSE 102*  --  168*  --   --  130* 126* 131*  BUN 131*  --  162*  --   --  104* 153* 74*  CREATININE 4.63*  --  5.54*  --   --  4.05* 5.19* 3.06*  CALCIUM 7.9*   < > 7.8*   < >  --  7.9* 8.3* 8.0*  MG 2.4  --  2.6*  --   --  2.2 2.5* 2.1  PHOS 7.7*  --  9.2*  --   --  6.2* 6.4* 4.7*   < > = values in this interval not displayed.    Liver Function Tests: Recent Labs  Lab 05/12/19 2041 05/12/19 2041 05/13/19 0344 05/14/19 0142 05/15/19 0526 05/16/19 0532 05/17/19 0238  AST 22  --   --   --   --   --   --   ALT 16  --   --   --   --   --   --   ALKPHOS 68  --   --   --   --   --   --   BILITOT 1.2  --   --   --   --   --   --  PROT 5.1*  --   --   --   --   --   --   ALBUMIN 2.6*   < > 2.5* 2.5* 2.2* 2.2* 2.1*   < > = values in this interval not displayed.   No results for input(s): LIPASE, AMYLASE in the last 168 hours. No results for input(s): AMMONIA in the last 168 hours.  CBC: Recent Labs  Lab 05/13/19 1630 05/14/19 0142 05/15/19 0526 05/16/19 0532 05/17/19 0618  WBC 6.7 6.2 10.0 7.1 6.6  HGB 10.9* 10.9* 11.0* 11.2* 11.4*  HCT 34.3* 34.3* 36.4* 36.4* 37.4*  MCV 90.5 90.5 94.1 93.8 93.3  PLT 109* 115* 86* 78* 77*    Cardiac Enzymes: No results for input(s): CKTOTAL, CKMB, CKMBINDEX, TROPONINI in the last 168 hours.  BNP: Invalid input(s): POCBNP  CBG: Recent Labs  Lab 05/16/19 0630 05/16/19 1124 05/16/19 1735 05/16/19 2153 05/17/19 0633  GLUCAP 109* 111* 136* 137* 106*    Microbiology: Results for orders placed or performed during the hospital encounter of 03/13/19  Respiratory Panel by RT PCR (Flu A&B, Covid) - Nasopharyngeal Swab     Status: None   Collection Time: 03/13/19  1:49 PM   Specimen: Nasopharyngeal Swab  Result Value Ref Range Status   SARS Coronavirus 2 by  RT PCR NEGATIVE NEGATIVE Final    Comment: (NOTE) SARS-CoV-2 target nucleic acids are NOT DETECTED. The SARS-CoV-2 RNA is generally detectable in upper respiratoy specimens during the acute phase of infection. The lowest concentration of SARS-CoV-2 viral copies this assay can detect is 131 copies/mL. A negative result does not preclude SARS-Cov-2 infection and should not be used as the sole basis for treatment or other patient management decisions. A negative result may occur with  improper specimen collection/handling, submission of specimen other than nasopharyngeal swab, presence of viral mutation(s) within the areas targeted by this assay, and inadequate number of viral copies (<131 copies/mL). A negative result must be combined with clinical observations, patient history, and epidemiological information. The expected result is Negative. Fact Sheet for Patients:  PinkCheek.be Fact Sheet for Healthcare Providers:  GravelBags.it This test is not yet ap proved or cleared by the [place] FDA and  has been authorized for detection and/or diagnosis of SARS-CoV-2 by FDA under an Emergency Use Authorization (EUA). This EUA will remain  in effect (meaning this test can be used) for the duration of the COVID-19 declaration under Section 564(b)(1) of the Act, 21 U.S.C. section 360bbb-3(b)(1), unless the authorization is terminated or revoked sooner.    Influenza A by PCR NEGATIVE NEGATIVE Final   Influenza B by PCR NEGATIVE NEGATIVE Final    Comment: (NOTE) The Xpert Xpress SARS-CoV-2/FLU/RSV assay is intended as an aid in  the diagnosis of influenza from Nasopharyngeal swab specimens and  should not be used as a sole basis for treatment. Nasal washings and  aspirates are unacceptable for Xpert Xpress SARS-CoV-2/FLU/RSV  testing. Fact Sheet for Patients: PinkCheek.be Fact Sheet for Healthcare  Providers: GravelBags.it This test is not yet approved or cleared by the [place] FDA and  has been authorized for detection and/or diagnosis of SARS-CoV-2 by  FDA under an Emergency Use Authorization (EUA). This EUA will remain  in effect (meaning this test can be used) for the duration of the  Covid-19 declaration under Section 564(b)(1) of the Act, 21  U.S.C. section 360bbb-3(b)(1), unless the authorization is  terminated or revoked. Performed at [facility], [address], [place],  [address]  MRSA PCR Screening     Status: None   Collection Time: 03/13/19  5:18 PM   Specimen: Nasal Mucosa; Nasopharyngeal  Result Value Ref Range Status   MRSA by PCR NEGATIVE NEGATIVE Final    Comment:        The GeneXpert MRSA Assay (FDA approved for NASAL specimens only), is one component of a comprehensive MRSA colonization surveillance program. It is not intended to diagnose MRSA infection nor to guide or monitor treatment for MRSA infections. Performed at [facility], [address] [place], [place] [address]   Surgical pcr screen     Status: None   Collection Time: 03/15/19  9:28 PM   Specimen: Nasal Mucosa; Nasal Swab  Result Value Ref Range Status   MRSA, PCR NEGATIVE NEGATIVE Final   Staphylococcus aureus NEGATIVE NEGATIVE Final    Comment: (NOTE) The Xpert SA Assay (FDA approved for NASAL specimens in patients 51 years of age and older), is one component of a comprehensive surveillance program. It is not intended to diagnose infection nor to guide or monitor treatment. Performed at [facility], [place] [address] [place], [place] [address]   Culture, respiratory (non-expectorated)     Status: None   Collection Time: 03/19/19  3:00 PM   Specimen: Tracheal Aspirate; Respiratory  Result Value Ref Range Status   Specimen Description TRACHEAL ASPIRATE  Final   Special Requests Normal  Final   Gram Stain   Final    ABUNDANT WBC  PRESENT,BOTH PMN AND MONONUCLEAR FEW GRAM POSITIVE COCCI RARE GRAM NEGATIVE RODS    Culture   Final    FEW Consistent with normal respiratory flora. Performed at [facility], [place] [address], [place], [place] [address]    Report Status 03/21/2019 FINAL  Final  Culture, respiratory (non-expectorated)     Status: None   Collection Time: 03/26/19  6:29 AM   Specimen: Tracheal Aspirate; Respiratory  Result Value Ref Range Status   Specimen Description TRACHEAL ASPIRATE  Final   Special Requests NONE  Final   Gram Stain   Final    ABUNDANT WBC PRESENT,BOTH PMN AND MONONUCLEAR NO ORGANISMS SEEN Performed at [facility], [address], [place], [place] [address]    Culture FEW PSEUDOMONAS AERUGINOSA  Final   Report Status 03/28/2019 FINAL  Final   Organism ID, Bacteria PSEUDOMONAS AERUGINOSA  Final      Susceptibility   Pseudomonas aeruginosa - MIC*    CEFTAZIDIME 8 SENSITIVE Sensitive     CIPROFLOXACIN <=0.25 SENSITIVE Sensitive     GENTAMICIN 4 SENSITIVE Sensitive     IMIPENEM 2 SENSITIVE Sensitive     * FEW PSEUDOMONAS AERUGINOSA  Culture, respiratory (non-expectorated)     Status: None   Collection Time: 04/03/19 11:08 AM   Specimen: Tracheal Aspirate; Respiratory  Result Value Ref Range Status   Specimen Description TRACHEAL ASPIRATE  Final   Special Requests NONE  Final   Gram Stain   Final    FEW WBC PRESENT, PREDOMINANTLY PMN FEW GRAM NEGATIVE RODS RARE YEAST    Culture   Final    FEW PSEUDOMONAS AERUGINOSA FEW CANDIDA TROPICALIS    Report Status 04/06/2019 FINAL  Final   Organism ID, Bacteria PSEUDOMONAS AERUGINOSA  Final      Susceptibility   Pseudomonas aeruginosa - MIC*    CEFTAZIDIME 32 RESISTANT Resistant     CIPROFLOXACIN <=0.25 SENSITIVE Sensitive     GENTAMICIN 4 SENSITIVE Sensitive     IMIPENEM  Value in next row Sensitive      2 SENSITIVEPerformed at [place] [address], [place], [place] [address]    * FEW PSEUDOMONAS  AERUGINOSA  Culture, blood (Routine X 2) w Reflex to ID Panel     Status: None   Collection Time: 04/03/19 12:30 PM   Specimen: BLOOD LEFT HAND  Result Value Ref Range Status   Specimen Description BLOOD LEFT HAND  Final   Special Requests   Final    BOTTLES DRAWN AEROBIC ONLY Blood Culture results may not be optimal due to an inadequate volume of blood received in culture bottles   Culture   Final    NO GROWTH 5 DAYS Performed at [facility], [place] [address] [place], [place] [address]    Report Status 04/08/2019 FINAL  Final  Culture, blood (Routine X 2) w Reflex to ID Panel     Status: None   Collection Time: 04/03/19 12:35 PM   Specimen: BLOOD LEFT HAND  Result Value Ref Range Status   Specimen Description BLOOD LEFT HAND  Final   Special Requests   Final    BOTTLES DRAWN AEROBIC ONLY Blood Culture results may not be optimal due to an inadequate volume of blood received in culture bottles   Culture   Final    NO GROWTH 5 DAYS Performed at [facility], [place] [address] [place], [place] [address]    Report Status 04/08/2019 FINAL  Final  Culture, respiratory (non-expectorated)     Status: None   Collection Time: 04/07/19 11:10 AM   Specimen: Tracheal Aspirate; Respiratory  Result Value Ref Range Status   Specimen Description TRACHEAL ASPIRATE  Final   Special Requests NONE  Final   Gram Stain   Final    MODERATE WBC PRESENT, PREDOMINANTLY PMN ABUNDANT GRAM NEGATIVE RODS Performed at [facility], [place] [address], [place], [place] [address]    Culture ABUNDANT PSEUDOMONAS AERUGINOSA  Final   Report Status 04/09/2019 FINAL  Final   Organism ID, Bacteria PSEUDOMONAS AERUGINOSA  Final      Susceptibility   Pseudomonas aeruginosa - MIC*    CEFTAZIDIME 16 INTERMEDIATE Intermediate     CIPROFLOXACIN <=0.25 SENSITIVE Sensitive     GENTAMICIN 4 SENSITIVE Sensitive     IMIPENEM 1 SENSITIVE Sensitive     * ABUNDANT PSEUDOMONAS AERUGINOSA  Culture, blood (routine  x 2)     Status: None   Collection Time: 04/07/19  2:07 PM   Specimen: BLOOD LEFT HAND  Result Value Ref Range Status   Specimen Description BLOOD LEFT HAND  Final   Special Requests   Final    BOTTLES DRAWN AEROBIC ONLY Blood Culture adequate volume Performed at [facility], [place] [address] [place], [place] [address]    Culture NO GROWTH 5 DAYS  Final   Report Status 04/12/2019 FINAL  Final  Culture, blood (routine x 2)     Status: None   Collection Time: 04/07/19  2:07 PM   Specimen: BLOOD LEFT HAND  Result Value Ref Range Status   Specimen Description BLOOD LEFT HAND  Final   Special Requests   Final    BOTTLES DRAWN AEROBIC ONLY Blood Culture adequate volume Performed at [facility], [place] [address] [place], [place] [address]    Culture NO GROWTH 5 DAYS  Final   Report Status 04/12/2019 FINAL  Final  Culture, Urine     Status: None  Collection Time: 04/13/19  6:58 PM   Specimen: Urine, Catheterized  Result Value Ref Range Status   Specimen Description URINE, CATHETERIZED  Final   Special Requests NONE  Final   Culture   Final    NO GROWTH Performed at [facility], [address], [place], [place] [address]    Report Status 04/14/2019 FINAL  Final  MRSA PCR Screening     Status: None   Collection Time: 04/15/19 12:53 PM   Specimen: Nasal Mucosa; Nasopharyngeal  Result Value Ref Range Status   MRSA by PCR NEGATIVE NEGATIVE Final    Comment:        The GeneXpert MRSA Assay (FDA approved for NASAL specimens only), is one component of a comprehensive MRSA colonization surveillance program. It is not intended to diagnose MRSA infection nor to guide or monitor treatment for MRSA infections. Performed at [facility], [place] [address] [place], [place] [address]   Culture, respiratory (non-expectorated)     Status: None   Collection Time: 04/20/19 11:35 AM   Specimen: Tracheal Aspirate; Respiratory  Result Value Ref Range Status   Specimen  Description TRACHEAL ASPIRATE  Final   Special Requests Normal  Final   Gram Stain   Final    RARE WBC PRESENT, PREDOMINANTLY PMN FEW GRAM NEGATIVE RODS FEW GRAM POSITIVE RODS Performed at [facility], [place] [address] [place], [place] [address]    Culture MODERATE PSEUDOMONAS AERUGINOSA  Final   Report Status 04/23/2019 FINAL  Final   Organism ID, Bacteria PSEUDOMONAS AERUGINOSA  Final      Susceptibility   Pseudomonas aeruginosa - MIC*    CEFTAZIDIME >=64 RESISTANT Resistant     CIPROFLOXACIN <=0.25 SENSITIVE Sensitive     GENTAMICIN 4 SENSITIVE Sensitive     IMIPENEM >=16 RESISTANT Resistant     * MODERATE PSEUDOMONAS AERUGINOSA  Body fluid culture     Status: None   Collection Time: 04/23/19 12:02 PM   Specimen: Pleural, Left; Body Fluid  Result Value Ref Range Status   Specimen Description PLEURAL LEFT  Final   Special Requests NONE  Final   Gram Stain   Final    ABUNDANT WBC PRESENT,BOTH PMN AND MONONUCLEAR NO ORGANISMS SEEN    Culture   Final    NO GROWTH 3 DAYS Performed at [facility], [address] [place], [place] [address]    Report Status 04/26/2019 FINAL  Final    Coagulation Studies: No results for input(s): LABPROT, INR in the last 72 hours.  Urinalysis: No results for input(s): COLORURINE, LABSPEC, PHURINE, GLUCOSEU, HGBUR, BILIRUBINUR, KETONESUR, PROTEINUR, UROBILINOGEN, NITRITE, LEUKOCYTESUR in the last 72 hours.  Invalid input(s): APPERANCEUR    Imaging: DG Swallowing Func-Speech Pathology  Result Date: 05/15/2019 Objective Swallowing Evaluation: Type of Study: MBS-Modified Barium Swallow Study  Patient Details Name: Jesse Murphy MRN: [id number] Date of Birth: 1928/07/13 Today's Date: 05/15/2019 Time: SLP Start Time (ACUTE ONLY): 5009 -SLP Stop Time (ACUTE ONLY): 1300 SLP Time Calculation (min) (ACUTE ONLY): 15 min Past Medical History: Past Medical History: Diagnosis Date . Atrial fibrillation ([place])  . CAD (coronary artery disease)   a.  s/p PCI in 1992 b. Coronary CT in 01/2018 showing extensive coronary calcification; FFR indeterminate --> medical management pursued at that time as patient not interested in repeat cath . Cancer [facility])   prostate . Dyspnea  . Hypertension  Past Surgical History: Past Surgical History: Procedure Laterality Date . CHEST TUBE INSERTION Left  04/23/2019  Procedure: INSERTION PLEURAL DRAINAGE CATHETER TO DRAIN LEFT PLEURAL EFFUSION;  Surgeon: Ivin Poot, MD;  Location: [place];  Service: Thoracic;  Laterality: Left; . CLIPPING OF ATRIAL APPENDAGE N/A 03/16/2019  Procedure: CLIPPING OF LEFT  ATRIAL APPENDAGE - USING ATRICLIP SIZE 40;  Surgeon: Ivin Poot, MD;  Location: [place];  Service: Open Heart Surgery;  Laterality: N/A; . COLONOSCOPY N/A 08/25/2017  Procedure: COLONOSCOPY;  Surgeon: Rogene Houston, MD;  [facility];  Service: Endoscopy;  Laterality: N/A; . CORONARY ARTERY BYPASS GRAFT N/A 03/16/2019  Procedure: CORONARY ARTERY BYPASS GRAFTING (CABG), ON PUMP, TIMES THREE, USING LEFT INTERNAL MAMMARY ARTERY, RIGHT GREATER SAPHENOUS VEIN HARVESTED ENDOSCOPICALLY;  Surgeon: Ivin Poot, MD;  Location: [place];  Service: Open Heart Surgery;  Laterality: N/A;  swan only . CYSTOSCOPY WITH FULGERATION N/A 04/30/2019  Procedure: CYSTOSCOPY WITH FULGERATION;  Surgeon: Irine Seal, MD;  Location: [place];  Service: Urology;  Laterality: N/A; . HERNIA REPAIR   . IR FLUORO GUIDE CV LINE LEFT  04/23/2019 . IR US GUIDE VASC ACCESS LEFT  04/23/2019 . MAZE N/A 03/16/2019  Procedure: MAZE;  Surgeon: Ivin Poot, MD;  Location: [place];  Service: Open Heart Surgery;  Laterality: N/A; . PLACEMENT OF IMPELLA LEFT VENTRICULAR ASSIST DEVICE Right 03/27/2019  Procedure: Placement Of Impella Left Ventricular Assist Device using ABIOMED Impella 5.5 with SmartAssist Device.;  Surgeon: Wonda Olds, MD;  [facility];  Service: Thoracic;  Laterality: Right; . PROSTATE SURGERY   . REMOVAL OF IMPELLA LEFT VENTRICULAR ASSIST  DEVICE N/A 04/10/2019  Procedure: REMOVAL OF IMPELLA LEFT VENTRICULAR ASSIST DEVICE WITH INSERTION OF RIGHT FEMORAL ARTERIAL LINE;  Surgeon: Ivin Poot, MD;  Location: [place];  Service: Open Heart Surgery;  Laterality: N/A; . RIGHT/LEFT HEART CATH AND CORONARY ANGIOGRAPHY N/A 03/15/2019  Procedure: RIGHT/LEFT HEART CATH AND CORONARY ANGIOGRAPHY;  Surgeon: Burnell Blanks, MD;  Location: [facility] [facility];  Service: Cardiovascular;  Laterality: N/A; . TEE WITHOUT CARDIOVERSION N/A 03/16/2019  Procedure: TRANSESOPHAGEAL ECHOCARDIOGRAM (TEE);  Surgeon: Prescott Gum, Collier Salina, MD;  Location: [place];  Service: Open Heart Surgery;  Laterality: N/A; . TRACHEOSTOMY TUBE PLACEMENT N/A 03/27/2019  Procedure: TRACHEOSTOMY placed using Shiley 8DCT Cuffed.;  Surgeon: Wonda Olds, MD;  [facility];  Service: Thoracic;  Laterality: N/A; . TRANSURETHRAL RESECTION OF BLADDER NECK N/A 05/13/2019  Procedure: CYSTOSCOPY CLOT EVACUATION FULGRATION CYSTOGRAM AND INSTILLATION OF FORMALIN;  Surgeon: Lucas Mallow, MD;  Location: [place];  Service: Urology;  Laterality: N/A; HPI: 86 y.o. male with PMH: h/o CAD s/p CABG and clipping of left atrial appendage, who presented with SOB on 2/1. He became hypoxic on 2/2 with pulmonary edema and needed to be intubated on 2/2 and extubated 2/5. Reintubated 2/8.  Pt underwent impella placement and tracheostomy on 2/9. Pt started on CRRT 2/11. Impella removal 2/23. Pt decannulated 3/13.  MBS 3/16 with penetration of nectar to the vocal folds without sensation.   Subjective: pleasant, a bit sleepy Assessment / Plan / Recommendation CHL IP CLINICAL IMPRESSIONS 05/15/2019 Clinical Impression Pt presents with improved oropharyngeal swallow function marked by functional oral control and better base-of-tongue propulsion, much less residue in pharynx post-swallow with better sensation of its presence. Pt continues to have delays in onset of laryngeal vestibule closure - this led, on one occasion,  to moderate aspiration of thin liquids during the swallow.  When pt tucked his chin, duration of laryngeal vestibule closure was longer, allowing thin  liquids to pass through pharynx and into UES without penetration nor aspiration.  This posture was repeated multiple times to ensure success.  Recommend advancing diet to dysphagia 2, thin liquids - pt may use straw to allow him to tuck chin in advance of sipping drinks.  RN present for study; results/recs reviewed.  SLP Visit Diagnosis Dysphagia, oropharyngeal phase (R13.12) Attention and concentration deficit following -- Frontal lobe and executive function deficit following -- Impact on safety and function Mild aspiration risk   CHL IP TREATMENT RECOMMENDATION 05/15/2019 Treatment Recommendations Therapy as outlined in treatment plan below   Prognosis 05/15/2019 Prognosis for Safe Diet Advancement Good Barriers to Reach Goals -- Barriers/Prognosis Comment -- CHL IP DIET RECOMMENDATION 05/15/2019 SLP Diet Recommendations Dysphagia 2 (Fine chop) solids;Thin liquid Liquid Administration via Straw;Cup Medication Administration Whole meds with puree Compensations Chin tuck;Slow rate Postural Changes --   CHL IP OTHER RECOMMENDATIONS 05/15/2019 Recommended Consults -- Oral Care Recommendations Oral care BID Other Recommendations --   CH[facility] IP FOLLOW UP RECOMMENDATIONS 05/15/2019 Follow up Recommendations Skilled Nursing facility   [facility] IP FREQUENCY AND DURATION 05/15/2019 Speech Therapy Frequency (ACUTE ONLY) min 3x week Treatment Duration 2 weeks      CHL IP ORAL PHASE 05/15/2019 Oral Phase WFL Oral - Pudding Teaspoon -- Oral - Pudding Cup -- Oral - Honey Teaspoon -- Oral - Honey Cup -- Oral - Nectar Teaspoon -- Oral - Nectar Cup -- Oral - Nectar Straw -- Oral - Thin Teaspoon -- Oral - Thin Cup -- Oral - Thin Straw -- Oral - Puree -- Oral - Mech Soft -- Oral - Regular -- Oral - Multi-Consistency -- Oral - Pill -- Oral Phase - Comment --  CHL IP PHARYNGEAL PHASE 05/15/2019  Pharyngeal Phase Impaired Pharyngeal- Pudding Teaspoon -- Pharyngeal -- Pharyngeal- Pudding Cup -- Pharyngeal -- Pharyngeal- Honey Teaspoon -- Pharyngeal -- Pharyngeal- Honey Cup NT Pharyngeal -- Pharyngeal- Nectar Teaspoon -- Pharyngeal -- Pharyngeal- Nectar Cup NT Pharyngeal -- Pharyngeal- Nectar Straw NT Pharyngeal -- Pharyngeal- Thin Teaspoon -- Pharyngeal -- Pharyngeal- Thin Cup NT Pharyngeal -- Pharyngeal- Thin Straw Delayed swallow initiation-pyriform sinuses;Delayed swallow initiation-vallecula;Penetration/Aspiration during swallow;Moderate aspiration Pharyngeal Material enters airway, passes BELOW cords and not ejected out despite cough attempt by patient Pharyngeal- Puree Delayed swallow initiation-vallecula;Pharyngeal residue - valleculae Pharyngeal Material does not enter airway Pharyngeal- Mechanical Soft -- Pharyngeal -- Pharyngeal- Regular -- Pharyngeal -- Pharyngeal- Multi-consistency -- Pharyngeal -- Pharyngeal- Pill -- Pharyngeal -- Pharyngeal Comment --  CHL IP CERVICAL ESOPHAGEAL PHASE 05/01/2019 Cervical Esophageal Phase WFL Pudding Teaspoon -- Pudding Cup -- Honey Teaspoon -- Honey Cup -- Nectar Teaspoon -- Nectar Cup -- Nectar Straw -- Thin Teaspoon -- Thin Cup -- Thin Straw -- Puree -- Mechanical Soft -- Regular -- Multi-consistency -- Pill -- Cervical Esophageal Comment -- Juan Quam Laurice 05/15/2019, 2:48 PM                Medications:   . sodium chloride    . sodium chloride    . sodium chloride Stopped (05/08/19 1833)  . epinephrine Stopped (05/13/19 0800)   . sodium chloride   Intravenous Once  . aspirin  81 mg Oral Daily  . calcium acetate (Phos Binder)  667 mg Per Tube TID WC  . chlorhexidine  15 mL Mouth Rinse BID  . Chlorhexidine Gluconate Cloth  6 each Topical Daily  . Chlorhexidine Gluconate Cloth  6 each Topical Q0600  . famotidine  10 mg Per Tube Daily  . feeding supplement (NEPRO CARB STEADY)  237 mL Per Tube QID  . feeding supplement (PRO-STAT SUGAR FREE  64)  60 mL Per Tube TID  . Gerhardt's butt cream   Topical BID  . heparin  4,000 Units Intravenous Once  . hydrocortisone   Rectal BID  . hydrocortisone  25 mg Rectal BID  . insulin aspart  0-15 Units Subcutaneous TID WC  . insulin aspart  0-5 Units Subcutaneous QHS  . magic mouthwash  5 mL Oral TID  . mouth rinse  15 mL Mouth Rinse q12n4p  . multivitamin  1 tablet Per Tube QHS  . nutrition supplement (JUVEN)  1 packet Per Tube BID BM  . patiromer  16.8 g Oral Daily  . predniSONE  10 mg Per Tube Q breakfast  . simethicone  80 mg Oral TID  . sodium chloride flush  10-40 mL Intracatheter Q12H  . sodium chloride flush  10-40 mL Intracatheter Q12H   sodium chloride, sodium chloride, oxyCODONE **AND** acetaminophen, dextrose, heparin, hydrOXYzine, hyoscyamine, levalbuterol, lidocaine (PF), lidocaine-prilocaine, loperamide HCl, ondansetron (ZOFRAN) IV, opium-belladonna, pentafluoroprop-tetrafluoroeth, promethazine, Resource ThickenUp Clear, sodium chloride flush, sodium chloride flush, sodium chloride flush  Assessment/ Plan:  1. Acute kidney Injury: Appears hemodynamically mediated with ATN following postoperative congestive heart failure/cardiogenic shock.  On renal replacement therapy since 2/11 and appears to be tolerating intermittent hemodialysis without problems.  He does not have any renal recovery and will continue dialysis on a MWF schedule   Next dialysis treatment will be 05/18/2019 2.  Acute systolic heart failure/cardiogenic shock: Status post Impella support.  He continues to tolerate hemodialysis without midodrine. 3.  Coronary artery disease status post three-vessel CABG 4.  Anemia: Secondary to acute/critical illness and acute blood loss with ongoing hematuria-off Aranesp for suspicion that this was inducing drug rash.    Does not appear to be an issue at this time of discontinued his Retacrit 05/16/2019 5.  Nutrition: Low albumin likely negative acute phase reactant, monitor with  ongoing diet/ONS. 6.  Secondary hyperparathyroidism: Calcium and phosphorus levels currently at goal, monitor with HD. 7.  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  Hyperkalemia.  Resolved.  Is still receiving Veltassa 16.8 g daily will discontinue 9.  Diffuse rash   now tapering empiric steroids.  LOS: [place] @TODAY @7 :43 AM

## 2019-05-17 NOTE — Progress Notes (Signed)
Pleurx catheter drained, patient tolerated well. 235mL.

## 2019-05-17 NOTE — Plan of Care (Signed)
  Problem: Education: Goal: Knowledge of General Education information will improve Description: Including pain rating scale, medication(s)/side effects and non-pharmacologic comfort measures Outcome: Progressing   Problem: Health Behavior/Discharge Planning: Goal: Ability to manage health-related needs will improve Outcome: Progressing   Problem: Clinical Measurements: Goal: Ability to maintain clinical measurements within normal limits will improve Outcome: Progressing Goal: Will remain free from infection Outcome: Progressing Goal: Diagnostic test results will improve Outcome: Progressing Goal: Respiratory complications will improve Outcome: Progressing Goal: Cardiovascular complication will be avoided Outcome: Progressing   Problem: Activity: Goal: Risk for activity intolerance will decrease Outcome: Progressing   Problem: Nutrition: Goal: Adequate nutrition will be maintained Outcome: Progressing   Problem: Coping: Goal: Level of anxiety will decrease Outcome: Progressing   Problem: Elimination: Goal: Will not experience complications related to urinary retention Outcome: Progressing   Problem: Pain Managment: Goal: General experience of comfort will improve Outcome: Progressing   Problem: Safety: Goal: Ability to remain free from injury will improve Outcome: Progressing   Problem: Skin Integrity: Goal: Risk for impaired skin integrity will decrease Outcome: Progressing   Problem: Education: Goal: Knowledge of disease or condition will improve Outcome: Progressing Goal: Understanding of medication regimen will improve Outcome: Progressing Goal: Individualized Educational Video(s) Outcome: Progressing   Problem: Activity: Goal: Ability to tolerate increased activity will improve Outcome: Progressing   Problem: Cardiac: Goal: Ability to achieve and maintain adequate cardiopulmonary perfusion will improve Outcome: Progressing   Problem: Health  Behavior/Discharge Planning: Goal: Ability to safely manage health-related needs after discharge will improve Outcome: Progressing   Problem: Education: Goal: Understanding of CV disease, CV risk reduction, and recovery process will improve Outcome: Progressing Goal: Individualized Educational Video(s) Outcome: Progressing   Problem: Activity: Goal: Ability to return to baseline activity level will improve Outcome: Progressing   Problem: Cardiovascular: Goal: Ability to achieve and maintain adequate cardiovascular perfusion will improve Outcome: Progressing Goal: Vascular access site(s) Level 0-1 will be maintained Outcome: Progressing   Problem: Health Behavior/Discharge Planning: Goal: Ability to safely manage health-related needs after discharge will improve Outcome: Progressing   Problem: Education: Goal: Knowledge of disease and its progression will improve Outcome: Progressing   Problem: Health Behavior/Discharge Planning: Goal: Ability to manage health-related needs will improve Outcome: Progressing   Problem: Clinical Measurements: Goal: Complications related to the disease process or treatment will be avoided or minimized Outcome: Progressing Goal: Dialysis access will remain free of complications Outcome: Progressing   Problem: Activity: Goal: Activity intolerance will improve Outcome: Progressing   Problem: Fluid Volume: Goal: Fluid volume balance will be maintained or improved Outcome: Progressing   Problem: Nutritional: Goal: Ability to make appropriate dietary choices will improve Outcome: Progressing   Problem: Respiratory: Goal: Respiratory symptoms related to disease process will be avoided Outcome: Progressing   Problem: Self-Concept: Goal: Body image disturbance will be avoided or minimized Outcome: Progressing   Problem: Urinary Elimination: Goal: Progression of disease will be identified and treated Outcome: Progressing   Problem:  Education: Goal: Will demonstrate proper wound care and an understanding of methods to prevent future damage Outcome: Progressing Goal: Knowledge of disease or condition will improve Outcome: Progressing Goal: Knowledge of the prescribed therapeutic regimen will improve Outcome: Progressing Goal: Individualized Educational Video(s) Outcome: Progressing   Problem: Activity: Goal: Risk for activity intolerance will decrease Outcome: Progressing   Problem: Cardiac: Goal: Will achieve and/or maintain hemodynamic stability Outcome: Progressing

## 2019-05-17 NOTE — Progress Notes (Signed)
Physical Therapy Treatment Patient Details Name: Jesse Murphy MRN: [id number] DOB: January 10, 1929 Today's Date: 05/17/2019    History of Present Illness 86 y.o. male with PMH: h/o CAD s/p CABG and clipping of left atrial appendage, who presented with SOB on 2/1. He became hypoxic on 2/2 with pulmonary edema and needed to be intubated on 2/2 and extubated 2/5. Pt underwent impella placement and tracheostomy on 2/9. Pt started on CRRT 2/11. Impella removal and tracheostomy 2/23. CRRT restarted 3/1. Pt transitioned to intermittent HD. Pt on continuous bladder irrigation.s/p cystoscopy with evacuation of clot and fulgoration of bladder neck bleeders 3/15. Pt with bradycardia and with temporary pacer placed 3/26 and removed 3/27. Pt with Cystoscopy with clot evacuation and fulguration on 3/28.    PT Comments    Pt progressing with mobility, although limited by nausea this session. Able to perform seated balance activity and transfer training with up to [place]; pt slightly orthostatic, reports in bed majority of yesterday. Nausea exacerbated with upright mobility, therefore pt declining further ambulation distance. SpO2 down to 89% on RA, maintaining >/94% on 1L O2 (RN notified).  Orthostatic BPs Supine 125/46  Sitting 114/47  Standing 101/47  Seated in recliner 120/47      Follow Up Recommendations  CIR;Supervision for mobility/OOB     Equipment Recommendations  (defer)    Recommendations for Other Services       Precautions / Restrictions Precautions Precautions: Fall;Other (comment) Precaution Comments: slightly orthostatic Restrictions Weight Bearing Restrictions: Yes RUE Weight Bearing: Partial weight bearing RUE Partial Weight Bearing Percentage or Pounds: Can place ~20lbs through his UEs for mobility per MD    Mobility  Bed Mobility Overal bed mobility: Needs Assistance Bed Mobility: Supine to Sit     Supine to sit: Min assist     General bed mobility comments: UE support  for trunk elevation and scooting hips to EOB  Transfers Overall transfer level: Needs assistance Equipment used: Rolling walker (2 wheeled) Transfers: Sit to/from Stand Sit to Stand: Mod assist         General transfer comment: cues for hand placement as pt attempting to pull on RW; modA for trunk elevation from slightly elevated bed height to RW  Ambulation/Gait Ambulation/Gait assistance: Min assist Gait Distance (Feet): 4 Feet Assistive device: Rolling walker (2 wheeled) Gait Pattern/deviations: Step-to pattern;Trunk flexed;Leaning posteriorly   Gait velocity interpretation: <1.31 ft/sec, indicative of household ambulator General Gait Details: No knee instability noted this session; walked short distance to recliner with RW and minA for balance, declined further distance secondary to c/o feeling dizzy and nauseous   Stairs             Wheelchair Mobility    Modified Rankin (Stroke Patients Only)       Balance Overall balance assessment: Needs assistance Sitting-balance support: Feet supported;Single extremity supported Sitting balance-Leahy Scale: Fair     Standing balance support: Bilateral upper extremity supported;During functional activity Standing balance-Leahy Scale: Poor Standing balance comment: Reliant on UE support and external assist                            Cognition Arousal/Alertness: Awake/alert Behavior During Therapy: Flat affect Overall Cognitive Status: No family/caregiver present to determine baseline cognitive functioning Area of Impairment: Problem solving;Following commands                       Following Commands: Follows one step commands with increased time;Follows  multi-step commands inconsistently     Problem Solving: Slow processing;Requires verbal cues        Exercises      General Comments General comments (skin integrity, edema, etc.): SpO2 down to 89% on RA, maintaining 95% on 1L (RN aware);  slightly orthostatic hypotension (see clinical impression)      Pertinent Vitals/Pain Pain Assessment: Faces Faces Pain Scale: Hurts a little bit Pain Location: L foot Pain Descriptors / Indicators: Discomfort;Guarding Pain Intervention(s): Monitored during session;Repositioned(floated L heel with pillow in recliner, RN notified)    Home Living                      Prior Function            PT Goals (current goals can now be found in the care plan section) Acute Rehab PT Goals Patient Stated Goal: get better; d/c to CIR today PT Goal Formulation: With patient Time For Goal Achievement: 05/29/19 Potential to Achieve Goals: Fair Progress towards PT goals: Progressing toward goals    Frequency    Min 3X/week      PT Plan Current plan remains appropriate    Co-evaluation              AM-PAC PT "6 Clicks" Mobility   Outcome Measure  Help needed turning from your back to your side while in a flat bed without using bedrails?: A Little Help needed moving from lying on your back to sitting on the side of a flat bed without using bedrails?: A Little Help needed moving to and from a bed to a chair (including a wheelchair)?: A Lot Help needed standing up from a chair using your arms (e.g., wheelchair or bedside chair)?: A Lot Help needed to walk in hospital room?: A Lot Help needed climbing 3-5 steps with a railing? : A Lot 6 Click Score: 14    End of Session Equipment Utilized During Treatment: Gait belt;Oxygen Activity Tolerance: Patient limited by fatigue Patient left: in chair;with call bell/phone within reach;with nursing/sitter in room Nurse Communication: Mobility status PT Visit Diagnosis: Other abnormalities of gait and mobility (R26.89);Muscle weakness (generalized) (M62.81)     Time: 3845-3646 PT Time Calculation (min) (ACUTE ONLY): 25 min  Charges:  $Therapeutic Activity: 23-37 mins                    Mabeline Caras, PT, DPT Acute  Rehabilitation Services  Pager [phone] Office [place] 05/17/2019, 4:36 PM

## 2019-05-17 NOTE — Progress Notes (Signed)
  Speech Language Pathology Treatment: Dysphagia  Patient Details Name: Jesse Murphy MRN: [id number] DOB: May 18, 1928 Today's Date: 05/17/2019 Time: 1222-     Assessment / Plan / Recommendation Clinical Impression  Pt not feeling well.  Vomited after breakfast this am.  Sitting in chair.  Accepted several sips of ice water, with good recall to use chin tuck for airway protection.  Denies any coughing with meal consumption. Swallow function is slowly improving.  Hopefully cortrak can be removed soon to improve comfort and perhaps desire to eat.  Only occasional verbal cues needed for precautions at this point - making good progress. D/W RN.   HPI HPI: 86 y.o. male with PMH: h/o CAD s/p CABG and clipping of left atrial appendage, who presented with SOB on 2/1. He became hypoxic on 2/2 with pulmonary edema and needed to be intubated on 2/2 and extubated 2/5. Reintubated 2/8.  Pt underwent impella placement and tracheostomy on 2/9. Pt started on CRRT 2/11. Impella removal 2/23. Pt decannulated 3/13.  MBS 3/16 with penetration of nectar to the vocal folds without sensation.  Repeat MBS 3/30 with improved function; chin tuck prevents aspiration with thins; started dys2/thin liquids on that date.      SLP Plan  Continue with current plan of care       Recommendations  Diet recommendations: Dysphagia 2 (fine chop);Thin liquid Liquids provided via: Cup;Straw Medication Administration: Whole meds with puree Supervision: Full supervision/cueing for compensatory strategies;Staff to assist with self feeding Compensations: Chin tuck;Slow rate Postural Changes and/or Swallow Maneuvers: Seated upright 90 degrees                Oral Care Recommendations: Oral care BID Follow up Recommendations: Inpatient Rehab SLP Visit Diagnosis: Dysphagia, oropharyngeal phase (R13.12) Plan: Continue with current plan of care       GO                Juan Quam Laurice 05/17/2019, 1:57 PM  Codie Hainer L.  Tivis Ringer, [place] Office number [phone] Pager [phone]

## 2019-05-17 NOTE — Progress Notes (Addendum)
Calorie Count Note  48 hour calorie count ordered.  Diet: DYS 2, thin liquids  Supplements: Magic Cup TID  Day 1 results:  Two meal tickets in envelope. Both documented as bites from each meal (no percentages). Not taking Magic Cup. Pt reports appetite is slowly progressing. Data does not reflect increasing PO intake. Complains of nausea and "vomiting" after meals.   Pt would benefit from long term feeding tube but is a poor candidate per TCTS.   Continue:  -1 carton Nepro four times daily  -60 ml Prostat TID  Provides:2280kcals,166 grams protein,61ml free water.  -D/C Juven  Mariana Single RD, LDN Clinical Nutrition Pager listed in [place]

## 2019-05-18 LAB — RENAL FUNCTION PANEL
Albumin: 2 g/dL — ABNORMAL LOW (ref 3.5–5.0)
Anion gap: 10 (ref 5–15)
BUN: 117 mg/dL — ABNORMAL HIGH (ref 8–23)
CO2: 27 mmol/L (ref 22–32)
Calcium: 8 mg/dL — ABNORMAL LOW (ref 8.9–10.3)
Chloride: 97 mmol/L — ABNORMAL LOW (ref 98–111)
Creatinine, Ser: 4.39 mg/dL — ABNORMAL HIGH (ref 0.61–1.24)
GFR calc Af Amer: 13 mL/min — ABNORMAL LOW (ref >60)
GFR calc non Af Amer: 11 mL/min — ABNORMAL LOW (ref >60)
Glucose, Bld: 115 mg/dL — ABNORMAL HIGH (ref 70–99)
Phosphorus: 5.3 mg/dL — ABNORMAL HIGH (ref 2.5–4.6)
Potassium: 4.4 mmol/L (ref 3.5–5.1)
Sodium: 134 mmol/L — ABNORMAL LOW (ref 135–145)

## 2019-05-18 LAB — CBC
HCT: 34.4 % — ABNORMAL LOW (ref 39.0–52.0)
Hemoglobin: 10.7 g/dL — ABNORMAL LOW (ref 13.0–17.0)
MCH: 28.8 pg (ref 26.0–34.0)
MCHC: 31.1 g/dL (ref 30.0–36.0)
MCV: 92.7 fL (ref 80.0–100.0)
Platelets: 84 10*3/uL — ABNORMAL LOW (ref 150–400)
RBC: 3.71 MIL/uL — ABNORMAL LOW (ref 4.22–5.81)
RDW: 19 % — ABNORMAL HIGH (ref 11.5–15.5)
WBC: 6.8 10*3/uL (ref 4.0–10.5)
nRBC: 0 % (ref 0.0–0.2)

## 2019-05-18 LAB — HEPARIN INDUCED PLATELET AB (HIT ANTIBODY): Heparin Induced Plt Ab: 0.087 OD (ref 0.000–0.400)

## 2019-05-18 LAB — GLUCOSE, CAPILLARY
Glucose-Capillary: 92 mg/dL (ref 70–99)
Glucose-Capillary: 106 mg/dL — ABNORMAL HIGH (ref 70–99)
Glucose-Capillary: 155 mg/dL — ABNORMAL HIGH (ref 70–99)
Glucose-Capillary: 108 mg/dL — ABNORMAL HIGH (ref 70–99)

## 2019-05-18 LAB — MAGNESIUM: Magnesium: 2.2 mg/dL (ref 1.7–2.4)

## 2019-05-18 MED ORDER — ANTICOAGULANT SODIUM CITRATE 4% (200MG/5ML) IV SOLN
5.0000 mL | Status: DC
  Administered 2019-05-18 – 2019-05-19 (×2): 5 mL
  Filled 2019-05-18 (×2): qty 10
  Filled 2019-05-18 (×2): qty 5
  Filled 2019-05-18: qty 10

## 2019-05-18 NOTE — Progress Notes (Signed)
[facility] ROUNDING NOTE   Subjective:   This is an 86 year old gentleman with a fairly long hospitalization developed acute systolic heart failure cardiogenic shock requiring Impella support post CABG postop ejection fraction 40% Impella removed 04/10/2019.  Status post CABG this admit.  History of paroxysmal atrial fibrillation status post maze LAA occlusion.  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 Gloriann Loan.  Now dialysis dependent since 04/08/2019.  Tolerated dialysis 05/16/2019 with removal of 1500 cc.  Is scheduled for dialysis 05/18/2019.  No heparin with dialysis.  Lock the dialysis ports with citrate.   Blood pressure 123/64 pulse 71 temperature 97.8 O2 sats 90% 1 L nasal cannula  Sodium 134 potassium 4.4 chloride 97 CO2 27 BUN 117 creatinine 4.39 glucose 115 calcium 8 phosphorus 5.3 albumin 2.0 magnesium 2.2 hemoglobin 10.7 platelets 84  Aspirin 81 mg daily Phoslyra 660 mg 3 times daily,  Pepcid 10 mg daily 1, prednisone 10 mg daily, Reglan 5 mg 3 times daily  Rena-Vite 1 daily    Objective:  Vital signs in last 24 hours:  Temp:  [97.7 F (36.5 C)-98.1 F (36.7 C)] 97.8 F (36.6 C) (04/02 0422) Pulse Rate:  [69-81] 70 (04/02 0422) Resp:  [13-21] 17 (04/02 0422) BP: (111-133)/(49-55) 118/50 (04/02 0422) SpO2:  [94 %-99 %] 97 % (04/02 0422) Weight:  [86.9 kg] 86.9 kg (04/02 0428)  Weight change: -0.1 kg Filed Weights   05/16/19 1715 05/17/19 0500 05/18/19 0428  Weight: 85.5 kg 85.7 kg 86.9 kg    Intake/Output: I/O last 3 completed shifts: In: 420 [NG/GT:420] Out: 1975 [Urine:200; Other:1775]   Intake/Output this shift:  Total I/O In: 180 [NG/GT:180] Out: -   Gen: Comfortably resting in bed, being cleaned up by nurse. CVS: Pulse regular rhythm, normal rate, S1 and S2 normal Resp: Anteriorly clear to auscultation, no rales.  Left IJ TDC Abd: Soft, obese, nontender Ext: Trace dependent  edema.   Basic Metabolic Panel: Recent Labs  Lab 05/14/19 0142 05/14/19 0142 05/14/19 1417 05/15/19 0526 05/15/19 0526 05/16/19 0532 05/17/19 0238 05/18/19 0235  NA 134*  --   --  140  --  135 137 134*  K 6.2*   < > 3.4* 4.7  --  5.1 4.5 4.4  CL 95*  --   --  102  --  96* 100 97*  CO2 22  --   --  26  --  26 27 27   GLUCOSE 168*  --   --  130*  --  126* 131* 115*  BUN 162*  --   --  104*  --  153* 74* 117*  CREATININE 5.54*  --   --  4.05*  --  5.19* 3.06* 4.39*  CALCIUM 7.8*   < >  --  7.9*   < > 8.3* 8.0* 8.0*  MG 2.6*  --   --  2.2  --  2.5* 2.1 2.2  PHOS 9.2*  --   --  6.2*  --  6.4* 4.7* 5.3*   < > = values in this interval not displayed.    Liver Function Tests: Recent Labs  Lab 05/12/19 2041 05/13/19 0344 05/14/19 0142 05/15/19 0526 05/16/19 0532 05/17/19 0238 05/18/19 0235  AST 22  --   --   --   --   --   --   ALT 16  --   --   --   --   --   --   Arabella Merles  68  --   --   --   --   --   --   BILITOT 1.2  --   --   --   --   --   --   PROT 5.1*  --   --   --   --   --   --   ALBUMIN 2.6*   < > 2.5* 2.2* 2.2* 2.1* 2.0*   < > = values in this interval not displayed.   No results for input(s): LIPASE, AMYLASE in the last 168 hours. No results for input(s): AMMONIA in the last 168 hours.  CBC: Recent Labs  Lab 05/14/19 0142 05/15/19 0526 05/16/19 0532 05/17/19 0618 05/18/19 0235  WBC 6.2 10.0 7.1 6.6 6.8  HGB 10.9* 11.0* 11.2* 11.4* 10.7*  HCT 34.3* 36.4* 36.4* 37.4* 34.4*  MCV 90.5 94.1 93.8 93.3 92.7  PLT 115* 86* 78* 77* 84*    Cardiac Enzymes: No results for input(s): CKTOTAL, CKMB, CKMBINDEX, TROPONINI in the last 168 hours.  BNP: Invalid input(s): POCBNP  CBG: Recent Labs  Lab 05/17/19 0633 05/17/19 1043 05/17/19 1622 05/17/19 2123 05/18/19 0611  GLUCAP 106* 126* 132* 102* 92    Microbiology: Results for orders placed or performed during the hospital encounter of 03/13/19  Respiratory Panel by RT PCR (Flu A&B, Covid) -  Nasopharyngeal Swab     Status: None   Collection Time: 03/13/19  1:49 PM   Specimen: Nasopharyngeal Swab  Result Value Ref Range Status   SARS Coronavirus 2 by RT PCR NEGATIVE NEGATIVE Final    Comment: (NOTE) SARS-CoV-2 target nucleic acids are NOT DETECTED. The SARS-CoV-2 RNA is generally detectable in upper respiratoy specimens during the acute phase of infection. The lowest concentration of SARS-CoV-2 viral copies this assay can detect is 131 copies/mL. A negative result does not preclude SARS-Cov-2 infection and should not be used as the sole basis for treatment or other patient management decisions. A negative result may occur with  improper specimen collection/handling, submission of specimen other than nasopharyngeal swab, presence of viral mutation(s) within the areas targeted by this assay, and inadequate number of viral copies (<131 copies/mL). A negative result must be combined with clinical observations, patient history, and epidemiological information. The expected result is Negative. Fact Sheet for Patients:  PinkCheek.be Fact Sheet for Healthcare Providers:  GravelBags.it This test is not yet ap proved or cleared by the [place] FDA and  has been authorized for detection and/or diagnosis of SARS-CoV-2 by FDA under an Emergency Use Authorization (EUA). This EUA will remain  in effect (meaning this test can be used) for the duration of the COVID-19 declaration under Section 564(b)(1) of the Act, 21 U.S.C. section 360bbb-3(b)(1), unless the authorization is terminated or revoked sooner.    Influenza A by PCR NEGATIVE NEGATIVE Final   Influenza B by PCR NEGATIVE NEGATIVE Final    Comment: (NOTE) The Xpert Xpress SARS-CoV-2/FLU/RSV assay is intended as an aid in  the diagnosis of influenza from Nasopharyngeal swab specimens and  should not be used as a sole basis for treatment. Nasal washings and  aspirates  are unacceptable for Xpert Xpress SARS-CoV-2/FLU/RSV  testing. Fact Sheet for Patients: PinkCheek.be Fact Sheet for Healthcare Providers: GravelBags.it This test is not yet approved or cleared by the [place] FDA and  has been authorized for detection and/or diagnosis of SARS-CoV-2 by  FDA under an Emergency Use Authorization (EUA). This EUA will remain  in effect (meaning this test can be  used) for the duration of the  Covid-19 declaration under Section 564(b)(1) of the Act, 21  U.S.C. section 360bbb-3(b)(1), unless the authorization is  terminated or revoked. Performed at [facility], [address], [place], [place] [address]   MRSA PCR Screening     Status: None   Collection Time: 03/13/19  5:18 PM   Specimen: Nasal Mucosa; Nasopharyngeal  Result Value Ref Range Status   MRSA by PCR NEGATIVE NEGATIVE Final    Comment:        The GeneXpert MRSA Assay (FDA approved for NASAL specimens only), is one component of a comprehensive MRSA colonization surveillance program. It is not intended to diagnose MRSA infection nor to guide or monitor treatment for MRSA infections. Performed at [facility], [address] [place], [place] [address]   Surgical pcr screen     Status: None   Collection Time: 03/15/19  9:28 PM   Specimen: Nasal Mucosa; Nasal Swab  Result Value Ref Range Status   MRSA, PCR NEGATIVE NEGATIVE Final   Staphylococcus aureus NEGATIVE NEGATIVE Final    Comment: (NOTE) The Xpert SA Assay (FDA approved for NASAL specimens in patients 39 years of age and older), is one component of a comprehensive surveillance program. It is not intended to diagnose infection nor to guide or monitor treatment. Performed at [facility], [place] [address] [place], [place] [address]   Culture, respiratory (non-expectorated)     Status: None   Collection Time: 03/19/19  3:00 PM   Specimen: Tracheal Aspirate;  Respiratory  Result Value Ref Range Status   Specimen Description TRACHEAL ASPIRATE  Final   Special Requests Normal  Final   Gram Stain   Final    ABUNDANT WBC PRESENT,BOTH PMN AND MONONUCLEAR FEW GRAM POSITIVE COCCI RARE GRAM NEGATIVE RODS    Culture   Final    FEW Consistent with normal respiratory flora. Performed at [facility], [place] [address], [place], [place] [address]    Report Status 03/21/2019 FINAL  Final  Culture, respiratory (non-expectorated)     Status: None   Collection Time: 03/26/19  6:29 AM   Specimen: Tracheal Aspirate; Respiratory  Result Value Ref Range Status   Specimen Description TRACHEAL ASPIRATE  Final   Special Requests NONE  Final   Gram Stain   Final    ABUNDANT WBC PRESENT,BOTH PMN AND MONONUCLEAR NO ORGANISMS SEEN Performed at [facility], [address], [place],  [address]    Culture FEW PSEUDOMONAS AERUGINOSA  Final   Report Status 03/28/2019 FINAL  Final   Organism ID, Bacteria PSEUDOMONAS AERUGINOSA  Final      Susceptibility   Pseudomonas aeruginosa - MIC*    CEFTAZIDIME 8 SENSITIVE Sensitive     CIPROFLOXACIN <=0.25 SENSITIVE Sensitive     GENTAMICIN 4 SENSITIVE Sensitive     IMIPENEM 2 SENSITIVE Sensitive     * FEW PSEUDOMONAS AERUGINOSA  Culture, respiratory (non-expectorated)     Status: None   Collection Time: 04/03/19 11:08 AM   Specimen: Tracheal Aspirate; Respiratory  Result Value Ref Range Status   Specimen Description TRACHEAL ASPIRATE  Final   Special Requests NONE  Final   Gram Stain   Final    FEW WBC PRESENT, PREDOMINANTLY PMN FEW GRAM NEGATIVE RODS RARE YEAST    Culture   Final    FEW PSEUDOMONAS AERUGINOSA FEW CANDIDA TROPICALIS    Report Status 04/06/2019 FINAL  Final   Organism ID, Bacteria PSEUDOMONAS AERUGINOSA  Final      Susceptibility   Pseudomonas aeruginosa - MIC*    CEFTAZIDIME 32 RESISTANT Resistant     CIPROFLOXACIN <=0.25 SENSITIVE Sensitive     GENTAMICIN 4 SENSITIVE  Sensitive     IMIPENEM Value in next row Sensitive      2 SENSITIVEPerformed at [place] [address], [place], [place] [address]    * FEW PSEUDOMONAS AERUGINOSA  Culture, blood (Routine X 2) w Reflex to ID Panel     Status: None   Collection Time: 04/03/19 12:30 PM   Specimen: BLOOD LEFT HAND  Result Value Ref Range Status   Specimen Description BLOOD LEFT HAND  Final   Special Requests   Final    BOTTLES DRAWN AEROBIC ONLY Blood Culture results may not be optimal due to an inadequate volume of blood received in culture bottles   Culture   Final    NO GROWTH 5 DAYS Performed at [facility], [place] [address] [place], [place] [address]    Report Status 04/08/2019 FINAL  Final  Culture, blood (Routine X 2) w Reflex to ID Panel     Status: None   Collection Time: 04/03/19 12:35 PM   Specimen: BLOOD LEFT HAND  Result Value Ref Range Status   Specimen Description BLOOD LEFT HAND  Final   Special Requests   Final    BOTTLES DRAWN AEROBIC ONLY Blood Culture results may not be optimal due to an inadequate volume of blood received in culture bottles   Culture   Final    NO GROWTH 5 DAYS Performed at [facility], [place] [address] [place], [place] [address]    Report Status 04/08/2019 FINAL  Final  Culture, respiratory (non-expectorated)     Status: None   Collection Time: 04/07/19 11:10 AM   Specimen: Tracheal Aspirate; Respiratory  Result Value Ref Range Status   Specimen Description TRACHEAL ASPIRATE  Final   Special Requests NONE  Final   Gram Stain   Final    MODERATE WBC PRESENT, PREDOMINANTLY PMN ABUNDANT GRAM NEGATIVE RODS Performed at [facility], [place] [address], [place], [place] [address]    Culture ABUNDANT PSEUDOMONAS AERUGINOSA  Final   Report Status 04/09/2019 FINAL  Final   Organism ID, Bacteria PSEUDOMONAS AERUGINOSA  Final      Susceptibility   Pseudomonas aeruginosa - MIC*    CEFTAZIDIME 16 INTERMEDIATE Intermediate     CIPROFLOXACIN  <=0.25 SENSITIVE Sensitive     GENTAMICIN 4 SENSITIVE Sensitive     IMIPENEM 1 SENSITIVE Sensitive     * ABUNDANT PSEUDOMONAS AERUGINOSA  Culture, blood (routine x 2)     Status: None   Collection Time: 04/07/19  2:07 PM   Specimen: BLOOD LEFT HAND  Result Value Ref Range Status   Specimen Description BLOOD LEFT HAND  Final   Special Requests   Final    BOTTLES DRAWN AEROBIC ONLY Blood Culture adequate volume Performed at [facility], [place] [address], [place], [place] [address]    Culture NO GROWTH 5 DAYS  Final   Report Status 04/12/2019 FINAL  Final  Culture, blood (routine x 2)     Status: None   Collection Time: 04/07/19  2:07 PM   Specimen: BLOOD LEFT HAND  Result Value Ref Range Status   Specimen Description BLOOD LEFT HAND  Final   Special Requests   Final    BOTTLES DRAWN AEROBIC ONLY Blood Culture adequate volume  Performed at [facility], [place] [address] [place], [place] [address]    Culture NO GROWTH 5 DAYS  Final   Report Status 04/12/2019 FINAL  Final  Culture, Urine     Status: None   Collection Time: 04/13/19  6:58 PM   Specimen: Urine, Catheterized  Result Value Ref Range Status   Specimen Description URINE, CATHETERIZED  Final   Special Requests NONE  Final   Culture   Final    NO GROWTH Performed at [facility], [address] [place], [place] [address]    Report Status 04/14/2019 FINAL  Final  MRSA PCR Screening     Status: None   Collection Time: 04/15/19 12:53 PM   Specimen: Nasal Mucosa; Nasopharyngeal  Result Value Ref Range Status   MRSA by PCR NEGATIVE NEGATIVE Final    Comment:        The GeneXpert MRSA Assay (FDA approved for NASAL specimens only), is one component of a comprehensive MRSA colonization surveillance program. It is not intended to diagnose MRSA infection nor to guide or monitor treatment for MRSA infections. Performed at [facility], [place] [address], [place], [place] [address]   Culture,  respiratory (non-expectorated)     Status: None   Collection Time: 04/20/19 11:35 AM   Specimen: Tracheal Aspirate; Respiratory  Result Value Ref Range Status   Specimen Description TRACHEAL ASPIRATE  Final   Special Requests Normal  Final   Gram Stain   Final    RARE WBC PRESENT, PREDOMINANTLY PMN FEW GRAM NEGATIVE RODS FEW GRAM POSITIVE RODS Performed at [facility], [place] [address], [place], [place] [address]    Culture MODERATE PSEUDOMONAS AERUGINOSA  Final   Report Status 04/23/2019 FINAL  Final   Organism ID, Bacteria PSEUDOMONAS AERUGINOSA  Final      Susceptibility   Pseudomonas aeruginosa - MIC*    CEFTAZIDIME >=64 RESISTANT Resistant     CIPROFLOXACIN <=0.25 SENSITIVE Sensitive     GENTAMICIN 4 SENSITIVE Sensitive     IMIPENEM >=16 RESISTANT Resistant     * MODERATE PSEUDOMONAS AERUGINOSA  Body fluid culture     Status: None   Collection Time: 04/23/19 12:02 PM   Specimen: Pleural, Left; Body Fluid  Result Value Ref Range Status   Specimen Description PLEURAL LEFT  Final   Special Requests NONE  Final   Gram Stain   Final    ABUNDANT WBC PRESENT,BOTH PMN AND MONONUCLEAR NO ORGANISMS SEEN    Culture   Final    NO GROWTH 3 DAYS Performed at [facility], [address] [place], [place] [address]    Report Status 04/26/2019 FINAL  Final    Coagulation Studies: No results for input(s): LABPROT, INR in the last 72 hours.  Urinalysis: No results for input(s): COLORURINE, LABSPEC, PHURINE, GLUCOSEU, HGBUR, BILIRUBINUR, KETONESUR, PROTEINUR, UROBILINOGEN, NITRITE, LEUKOCYTESUR in the last 72 hours.  Invalid input(s): APPERANCEUR    Imaging: No results found.   Medications:   . sodium chloride    . sodium chloride    . sodium chloride Stopped (05/08/19 1833)   . sodium chloride   Intravenous Once  . aspirin  81 mg Oral Daily  . calcium acetate (Phos Binder)  667 mg Per Tube TID WC  . chlorhexidine  15 mL Mouth Rinse BID  . Chlorhexidine  Gluconate Cloth  6 each Topical Daily  . Chlorhexidine Gluconate Cloth  6 each Topical Q0600  . Chlorhexidine Gluconate  Cloth  6 each Topical [id number]  . famotidine  10 mg Per Tube Daily  . feeding supplement (NEPRO CARB STEADY)  237 mL Per Tube QID  . feeding supplement (PRO-STAT SUGAR FREE 64)  60 mL Per Tube TID  . Gerhardt's butt cream   Topical BID  . hydrocortisone   Rectal BID  . hydrocortisone  25 mg Rectal BID  . insulin aspart  0-15 Units Subcutaneous TID WC  . insulin aspart  0-5 Units Subcutaneous QHS  . magic mouthwash  5 mL Oral TID  . mouth rinse  15 mL Mouth Rinse q12n4p  . metoCLOPramide  5 mg Oral TID AC  . multivitamin  1 tablet Per Tube QHS  . predniSONE  10 mg Per Tube Q breakfast  . simethicone  80 mg Oral TID  . sodium chloride flush  10-40 mL Intracatheter Q12H  . sodium chloride flush  10-40 mL Intracatheter Q12H   sodium chloride, sodium chloride, oxyCODONE **AND** acetaminophen, dextrose, hydrOXYzine, hyoscyamine, levalbuterol, lidocaine (PF), lidocaine-prilocaine, loperamide HCl, ondansetron (ZOFRAN) IV, opium-belladonna, pentafluoroprop-tetrafluoroeth, promethazine, Resource ThickenUp Clear, sodium chloride flush, sodium chloride flush, sodium chloride flush  Assessment/ Plan:  1. Acute kidney Injury: Appears hemodynamically mediated with ATN following postoperative congestive heart failure/cardiogenic shock.  On renal replacement therapy since 2/11 and appears to be tolerating intermittent hemodialysis without problems.  He does not have any renal recovery and will continue dialysis on a MWF schedule   Next dialysis treatment will be 05/18/2019.  No heparin.  Lock the ports with citrate.  HIT test pending 2.  Acute systolic heart failure/cardiogenic shock: Status post Impella support.  He continues to tolerate hemodialysis without midodrine. 3.  Coronary artery disease status post three-vessel CABG 4.  Anemia: Secondary to acute/critical illness and acute blood loss  with ongoing hematuria-off Aranesp for suspicion that this was inducing drug rash.    Does not appear to be an issue at this time of discontinued his Retacrit 05/16/2019 5.  Nutrition: Low albumin likely negative acute phase reactant, monitor with ongoing diet/ONS. 6.  Secondary hyperparathyroidism: Calcium and phosphorus levels currently at goal, monitor with HD. 7.  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  Hyperkalemia.  Resolved.  9.  Diffuse rash   now tapering empiric steroids.  LOS: [place] @TODAY @6 :59 AM

## 2019-05-18 NOTE — Progress Notes (Signed)
Calorie Count Note  48 hour calorie count ordered.  Diet: DYS 2, thin liquids  Supplements: Magic Cup TID  Pt would benefit from long term feeding tube but is a poor candidate per TCTS.   Per MD notes, still vomiting after meals; reglan added on 05/17/19. Pt sleeping soundly at time of visit. He did not waken to voice.   Plan to d/c to CIR once bed is available (possible early next week).   Day 1 results:  Two meal tickets in envelope. Both documented as bites from each meal (no percentages). Not taking Magic Cup. Pt reports appetite is slowly progressing. Data does not reflect increasing PO intake. Complains of nausea and "vomiting" after meals.   Day 2 results:  No meal tickets in envelope. Meal completions documented at 0%.   Will d/c calorie count  Continue:  -1 carton Nepro four times daily  -60 ml Prostat TID  Provides:2280kcals,166grams protein,637ml free water.  Loistine Chance, RD, LDN, [place] Registered Dietitian II Certified Diabetes Care and Education Specialist Please refer to [facility] for RD and/or RD on-call/weekend/after hours pager

## 2019-05-18 NOTE — Progress Notes (Addendum)
[address]       [place] [address]             [phone]    Subjective: Some vomiting yesterday after eating  Objective: Vital signs in last 24 hours: Temp:  [97.5 F (36.4 C)-98.1 F (36.7 C)] 97.5 F (36.4 C) (04/02 0740) Pulse Rate:  [69-74] 72 (04/02 0740) Cardiac Rhythm: Normal sinus rhythm (04/02 0740) Resp:  [13-23] 15 (04/02 0740) BP: (111-127)/(49-64) 127/50 (04/02 0740) SpO2:  [94 %-99 %] 98 % (04/02 0740) Weight:  [86.9 kg] 86.9 kg (04/02 0428)  Hemodynamic parameters for last 24 hours:    Intake/Output from previous day: 04/01 0701 - 04/02 0700 In: 480 [NG/GT:480] Out: 475 [Urine:200] Intake/Output this shift: No intake/output data recorded.  General appearance: alert, cooperative, fatigued and no distress Heart: regular rate and rhythm Lungs: clear to auscultation bilaterally Abdomen: soft, nontender Extremities: min edema Wound: incis healing well  Lab Results: Recent Labs    05/17/19 0618 05/18/19 0235  WBC 6.6 6.8  HGB 11.4* 10.7*  HCT 37.4* 34.4*  PLT 77* 84*   BMET:  Recent Labs    05/17/19 0238 05/18/19 0235  NA 137 134*  K 4.5 4.4  CL 100 97*  CO2 27 27  GLUCOSE 131* 115*  BUN 74* 117*  CREATININE 3.06* 4.39*  CALCIUM 8.0* 8.0*    PT/INR: No results for input(s): LABPROT, INR in the last 72 hours. ABG    Component Value Date/Time   PHART 7.369 05/10/2019 1804   HCO3 20.0 05/10/2019 1804   TCO2 21 (L) 05/10/2019 1804   ACIDBASEDEF 5.0 (H) 05/10/2019 1804   O2SAT 99.0 05/10/2019 1804   CBG (last 3)  Recent Labs    05/17/19 1622 05/17/19 2123 05/18/19 0611  GLUCAP 132* 102* 92    Meds Scheduled Meds: . sodium chloride   Intravenous Once  . aspirin  81 mg Oral Daily  . calcium acetate (Phos Binder)  667 mg Per Tube TID WC  . chlorhexidine  15 mL Mouth Rinse BID  . Chlorhexidine Gluconate Cloth  6 each Topical Daily  . Chlorhexidine Gluconate Cloth  6 each Topical Q0600  .  Chlorhexidine Gluconate Cloth  6 each Topical Q0600  . famotidine  10 mg Per Tube Daily  . feeding supplement (NEPRO CARB STEADY)  237 mL Per Tube QID  . feeding supplement (PRO-STAT SUGAR FREE 64)  60 mL Per Tube TID  . Gerhardt's butt cream   Topical BID  . hydrocortisone   Rectal BID  . hydrocortisone  25 mg Rectal BID  . insulin aspart  0-15 Units Subcutaneous TID WC  . insulin aspart  0-5 Units Subcutaneous QHS  . magic mouthwash  5 mL Oral TID  . mouth rinse  15 mL Mouth Rinse q12n4p  . metoCLOPramide  5 mg Oral TID AC  . multivitamin  1 tablet Per Tube QHS  . predniSONE  10 mg Per Tube Q breakfast  . simethicone  80 mg Oral TID  . sodium chloride flush  10-40 mL Intracatheter Q12H  . sodium chloride flush  10-40 mL Intracatheter Q12H   Continuous Infusions: . sodium chloride    . sodium chloride    . sodium chloride Stopped (05/08/19 1833)   PRN Meds:.sodium chloride, sodium chloride, oxyCODONE **AND** acetaminophen, dextrose, hydrOXYzine, hyoscyamine, levalbuterol, lidocaine (PF), lidocaine-prilocaine, loperamide HCl, ondansetron (ZOFRAN) IV, opium-belladonna, pentafluoroprop-tetrafluoroeth, promethazine, Resource ThickenUp Clear, sodium chloride flush, sodium chloride flush, sodium chloride  flush  Xrays No results found.  Assessment/Plan: S/P Procedure(s) (LRB): CYSTOSCOPY CLOT EVACUATION FULGRATION CYSTOGRAM AND INSTILLATION OF FORMALIN (N/A)  1 remains hemodyn  stable in sinus rhythm, not requiring meds for HF 2 conts HD for ESRD/volume 3 reglan added yesterday 4 platelets slightly improved- no heparin with dialysis. H/H down slightly , monitor clinically 5 CIR soon  LOS: 65 days    John Giovanni PA-C Pager [phone] 05/18/2019   Platelets sl improved- no result from HIT- conr to hold heparin Cont with rehab therapies- drain L pleurx Mon and thurs   patient examined and medical record reviewed,agree with above note. Tharon Aquas Trigt III 05/18/2019

## 2019-05-18 NOTE — Progress Notes (Signed)
Inpatient Rehab Admissions Coordinator:    I have no beds available for this patient today.  Will continue to follow for timing of rehab admission pending bed availability, hopefully early next week.   Shann Medal, PT, DPT Admissions Coordinator [phone] 05/18/19  10:29 AM

## 2019-05-18 NOTE — Progress Notes (Addendum)
Patient ID: Jesse Murphy, male   DOB: 24-Sep-1928, 86 y.o.   MRN: [id number]    Advanced Heart Failure Rounding Note  PCP-Cardiologist: Kate Sable, MD   Subjective:    Events - Impella removed 2/23. Intraoperative TEE showed EF ~40% - Had tunneled cath placed 3/9 and left PleurX tube.  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 Emergent TVP placed - went to OR for repeat cystoscopy, fulgeration and formalin installation in bladder  Continues on HD MWF. Making some urine, 200 cc charted yesterday.    Resting comfortably in bed. No complaints. No appetite. Not interested in eating his lunch.  Also continues w/ TFs.   Objective:   Weight Range: 86.9 kg Body mass index is 29.14 kg/m.   Vital Signs:   Temp:  [97.5 F (36.4 C)-98.1 F (36.7 C)] 97.5 F (36.4 C) (04/02 1051) Pulse Rate:  [69-73] 69 (04/02 1051) Resp:  [15-23] 19 (04/02 1051) BP: (111-127)/(49-64) 116/52 (04/02 1051) SpO2:  [95 %-99 %] 96 % (04/02 1051) Weight:  [86.9 kg] 86.9 kg (04/02 0428) Last BM Date: 05/14/19  Weight change: Filed Weights   05/16/19 1715 05/17/19 0500 05/18/19 0428  Weight: 85.5 kg 85.7 kg 86.9 kg    Intake/Output:   Intake/Output Summary (Last 24 hours) at 05/18/2019 1323 Last data filed at 05/17/2019 1920 Gross per 24 hour  Intake 180 ml  Output 375 ml  Net -195 ml     Physical Exam   PHYSICAL EXAM: General:  Chronically ill appearing elderly WM. No respiratory difficulty HEENT: normal + NGT anicteric  Neck: supple.  Mildly elevated JVD. Carotids 2+ bilat; no bruits. No lymphadenopathy or thyromegaly appreciated. Cor: PMI nondisplaced. Regular rate & rhythm. No rubs, gallops or murmurs. Lungs: clear no wheeze Abdomen: soft, nontender, nondistended. No hepatosplenomegaly. No bruits or masses. Good  bowel sounds. Extremities: no cyanosis, clubbing, rash, 1+ bilateral LEE up to knees Neuro: alert & oriented x 3, cranial nerves grossly intact. moves all 4 extremities w/o difficulty. Affect pleasant   Telemetry   Sinus 70-80s Personally reviewed   Labs    CBC Recent Labs    05/17/19 0618 05/18/19 0235  WBC 6.6 6.8  HGB 11.4* 10.7*  HCT 37.4* 34.4*  MCV 93.3 92.7  PLT 77* 84*   Basic Metabolic Panel Recent Labs    05/17/19 0238 05/18/19 0235  NA 137 134*  K 4.5 4.4  CL 100 97*  CO2 27 27  GLUCOSE 131* 115*  BUN 74* 117*  CREATININE 3.06* 4.39*  CALCIUM 8.0* 8.0*  MG 2.1 2.2  PHOS 4.7* 5.3*   Liver Function Tests Recent Labs    05/17/19 0238 05/18/19 0235  ALBUMIN 2.1* 2.0*   No results for input(s): LIPASE, AMYLASE in the last 72 hours. Cardiac Enzymes No results for input(s): CKTOTAL, CKMB, CKMBINDEX, TROPONINI in the last 72 hours.  BNP: BNP (last 3 results) Recent Labs    03/22/19 0401 03/26/19 0626 03/27/19 1255  BNP 340.4* 687.5* 800.4*    ProBNP (last 3 results) No results for input(s): PROBNP in the last 8760 hours.   D-Dimer No results for input(s): DDIMER in the last 72 hours. Hemoglobin A1C No results for input(s): HGBA1C in the last 72 hours. Fasting Lipid Panel No results for input(s): CHOL, HDL, LDLCALC, TRIG, CHOLHDL,  LDLDIRECT in the last 72 hours. Thyroid Function Tests No results for input(s): TSH, T4TOTAL, T3FREE, THYROIDAB in the last 72 hours.  Invalid input(s): FREET3  Other results:   Imaging    No results found.   Medications:     Scheduled Medications: . sodium chloride   Intravenous Once  . aspirin  81 mg Oral Daily  . calcium acetate (Phos Binder)  667 mg Per Tube TID WC  . chlorhexidine  15 mL Mouth Rinse BID  . Chlorhexidine Gluconate Cloth  6 each Topical Daily  . Chlorhexidine Gluconate Cloth  6 each Topical Q0600  . Chlorhexidine Gluconate Cloth  6 each Topical Q0600  . famotidine  10 mg Per  Tube Daily  . feeding supplement (NEPRO CARB STEADY)  237 mL Per Tube QID  . feeding supplement (PRO-STAT SUGAR FREE 64)  60 mL Per Tube TID  . Gerhardt's butt cream   Topical BID  . hydrocortisone   Rectal BID  . hydrocortisone  25 mg Rectal BID  . insulin aspart  0-15 Units Subcutaneous TID WC  . insulin aspart  0-5 Units Subcutaneous QHS  . magic mouthwash  5 mL Oral TID  . mouth rinse  15 mL Mouth Rinse q12n4p  . metoCLOPramide  5 mg Oral TID AC  . multivitamin  1 tablet Per Tube QHS  . simethicone  80 mg Oral TID  . sodium chloride flush  10-40 mL Intracatheter Q12H  . sodium chloride flush  10-40 mL Intracatheter Q12H    Infusions: . sodium chloride    . sodium chloride    . sodium chloride Stopped (05/08/19 1833)  . anticoagulant sodium citrate      PRN Medications: sodium chloride, sodium chloride, oxyCODONE **AND** acetaminophen, dextrose, hydrOXYzine, hyoscyamine, levalbuterol, lidocaine (PF), lidocaine-prilocaine, loperamide HCl, ondansetron (ZOFRAN) IV, opium-belladonna, pentafluoroprop-tetrafluoroeth, promethazine, Resource ThickenUp Clear, sodium chloride flush, sodium chloride flush, sodium chloride flush    Assessment/Plan   1. Acute systolic HF -> Cardiogenic shock - post-op echo on 03/20/19 EF 25-30% (pre-op 30-35%. In 12/2017 EF normal) - Required Impella support post CABG. Impella removed 2/23. Post-op TEE EF 40% - Has developed ESRD. Volume controlled through HD - Weight up 3 lb w/ mild edema. Plan HD today  - Continue iHD on MWF Schedule. Now off midodrine due to rash (unclear which medication caused). Can add back as needed - unable to tolerate GDMT with low BP and ESRD  2. CAD s/p CABG this admit (LIMA->LAD, SVG->OM, SVG->RCA on 03/15/19) - No s/s ischemia - Continue statin.  - Off ASA due to hematuria and BRBPR  3. Acute hypoxic respiratory failure with Pseudomonas PNA - s/p trach on 03/27/19 - completed meropenem for pseudomonas PNA - trach  aspirate 3/5 w/ regrowth of Pseudomonas again.   - Completed antibiotic course.  -Trach decannulated 3/13. Stable on [place] - Stable no change   4. PAF - s/p MAZE and LAA occlusion - Remains in NSR. Off amio due to rash (unclear which agent it was) - Can reconsider AC in near future as bladder issues stabilize - no change for now  5. AKI -> ESRD - due to shock/ATN.  - Tunneled cath placed 3/9  - He is tolerating iHD. Midodrine now off.  - Vein mapping complete. Nephrology considering permanent access but no formal plans   6. Hematuria -Cystoscopy 3/15 with fulgeration - Back to OR 3/28 with fulgeration and formalin installation - No further bleeding. Hgb stable  7. Complete heart block with profound  symptomatic bradycardia - likely vagal in nature - s/p emergent TVP 3/25 - resolved TVP out.   8. Debility, severe - continue PT/OT - Hopefully to CIR soon.  9. F/E/N - Poor po intake.  - Continues w/ Cor-trak for TFs - Encourage po      10. Left Pleural Effusion  - moderate to large. - s/p PleurX on 3/8.  - TCTS managing. Draining M/Th  10. Diffuse rash - Appearance of drug induced rash. - Steroids started 3/21 -> much improved - Now tapering steroids. Decrease to prednisone 10 mg today - Can add needed meds back 1 by 1 watching for recurrent rash  11. Anemia: - Resolved - hgb 9.0 -> 7.9 -> 10.9 -> 11.2->11.4->10.7  12. Dysphagia and BRBPR - ? Possible esophagitis +/- radiation proctitis - GI following.Treated with IV fluconazole. Resolved  13. Hyperkalemia - resolved. K 4.4 today  - lokelma as needed on non HD days. - Renal following    Lyda Jester, PA-C  1:23 PM   Patient seen and examined with the above-signed Advanced Practice Provider and/or Housestaff. I personally reviewed laboratory data, imaging studies and relevant notes. I independently examined the patient and formulated the important aspects of the plan. I have edited the note to reflect any  of my changes or salient points. I have personally discussed the plan with the patient and/or family.   Remains weak. Still in NSR. Tolerating iHD. No further hematuria. Nausea better with reglan,   Needs rehab.   On exam Weak NAD No JVD Cor RRR + tunneled HD cath Lungs dull at bases Ab soft NT Ext pale trace edema   I will see again on Monday. Please call me over the weekend if needed.   Glori Bickers, MD  6:11 PM

## 2019-05-19 ENCOUNTER — Inpatient Hospital Stay (HOSPITAL_COMMUNITY): Payer: Medicare Other

## 2019-05-19 LAB — RENAL FUNCTION PANEL
Albumin: 2.1 g/dL — ABNORMAL LOW (ref 3.5–5.0)
Anion gap: 12 (ref 5–15)
BUN: 160 mg/dL — ABNORMAL HIGH (ref 8–23)
CO2: 26 mmol/L (ref 22–32)
Calcium: 8.2 mg/dL — ABNORMAL LOW (ref 8.9–10.3)
Chloride: 96 mmol/L — ABNORMAL LOW (ref 98–111)
Creatinine, Ser: 5.56 mg/dL — ABNORMAL HIGH (ref 0.61–1.24)
GFR calc Af Amer: 10 mL/min — ABNORMAL LOW (ref >60)
GFR calc non Af Amer: 8 mL/min — ABNORMAL LOW (ref >60)
Glucose, Bld: 117 mg/dL — ABNORMAL HIGH (ref 70–99)
Phosphorus: 6.3 mg/dL — ABNORMAL HIGH (ref 2.5–4.6)
Potassium: 4.6 mmol/L (ref 3.5–5.1)
Sodium: 134 mmol/L — ABNORMAL LOW (ref 135–145)

## 2019-05-19 LAB — GLUCOSE, CAPILLARY
Glucose-Capillary: 102 mg/dL — ABNORMAL HIGH (ref 70–99)
Glucose-Capillary: 92 mg/dL (ref 70–99)
Glucose-Capillary: 101 mg/dL — ABNORMAL HIGH (ref 70–99)
Glucose-Capillary: 91 mg/dL (ref 70–99)

## 2019-05-19 LAB — CBC
HCT: 35.4 % — ABNORMAL LOW (ref 39.0–52.0)
Hemoglobin: 10.9 g/dL — ABNORMAL LOW (ref 13.0–17.0)
MCH: 28.8 pg (ref 26.0–34.0)
MCHC: 30.8 g/dL (ref 30.0–36.0)
MCV: 93.7 fL (ref 80.0–100.0)
Platelets: 92 10*3/uL — ABNORMAL LOW (ref 150–400)
RBC: 3.78 MIL/uL — ABNORMAL LOW (ref 4.22–5.81)
RDW: 18.8 % — ABNORMAL HIGH (ref 11.5–15.5)
WBC: 6.8 10*3/uL (ref 4.0–10.5)
nRBC: 0 % (ref 0.0–0.2)

## 2019-05-19 LAB — MAGNESIUM: Magnesium: 2.3 mg/dL (ref 1.7–2.4)

## 2019-05-19 MED ORDER — PROMETHAZINE HCL 25 MG/ML IJ SOLN
INTRAMUSCULAR | Status: AC
  Filled 2019-05-19: qty 1

## 2019-05-19 MED ORDER — SALINE SPRAY 0.65 % NA SOLN
1.0000 | NASAL | Status: DC | PRN
  Filled 2019-05-19: qty 44

## 2019-05-19 MED ORDER — CHLORHEXIDINE GLUCONATE CLOTH 2 % EX PADS
6.0000 | MEDICATED_PAD | Freq: Every day | CUTANEOUS | Status: DC
  Administered 2019-05-19 – 2019-05-22 (×4): 6 via TOPICAL

## 2019-05-19 MED ORDER — ANTICOAGULANT SODIUM CITRATE 4% (200MG/5ML) IV SOLN
5.0000 mL | Freq: Once | Status: DC
  Filled 2019-05-19: qty 5

## 2019-05-19 NOTE — Progress Notes (Signed)
[facility] ROUNDING NOTE   Subjective:   This is an 86 year old gentleman with a fairly long hospitalization developed acute systolic heart failure cardiogenic shock requiring Impella support post CABG postop ejection fraction 40% Impella removed 04/10/2019.  Status post CABG this admit.  History of paroxysmal atrial fibrillation status post maze LAA occlusion.  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 Gloriann Loan.  Now dialysis dependent since 04/08/2019.  Tolerated dialysis 05/16/2019 with removal of 1500 cc.  Is scheduled for dialysis 05/18/2019.  No heparin with dialysis.  Lock the dialysis ports with citrate.  Awaiting result from HIT panel   Blood pressure 109/38 pulse 72 temperature 97.9 O2 sats 97% 1 L  Sodium 134 potassium 4.6 chloride 96 CO2 26 BUN 160 creatinine 5.56 glucose 117 calcium 8.2 phosphorus 6.3 magnesium 2.3 albumin 2.1 hemoglobin 10.9 platelets 92  Aspirin 81 mg daily Phoslyra 660 mg 3 times daily,  Pepcid 10 mg daily 1, prednisone 10 mg daily, Reglan 5 mg 3 times daily  Rena-Vite 1 daily    Objective:  Vital signs in last 24 hours:  Temp:  [97.5 F (36.4 C)-98 F (36.7 C)] 97.9 F (36.6 C) (04/03 0251) Pulse Rate:  [69-74] 72 (04/03 0251) Resp:  [13-24] 16 (04/03 0251) BP: (115-134)/(45-52) 134/50 (04/03 0251) SpO2:  [93 %-98 %] 96 % (04/03 0251) Weight:  [87.2 kg] 87.2 kg (04/03 0303)  Weight change: 0.3 kg Filed Weights   05/17/19 0500 05/18/19 0428 05/19/19 0303  Weight: 85.7 kg 86.9 kg 87.2 kg    Intake/Output: I/O last 3 completed shifts: In: 932 [P.O.:230; NG/GT:717] Out: 476 [Urine:200; Other:275; Stool:1]   Intake/Output this shift:  Total I/O In: 260 [NG/GT:260] Out: -   Gen: Comfortably resting in bed, being cleaned up by nurse. CVS: Pulse regular rhythm, normal rate, S1 and S2 normal Resp: Anteriorly clear to auscultation, no rales.  Left IJ TDC Abd: Soft, obese,  nontender Ext: Trace dependent edema.   Basic Metabolic Panel: Recent Labs  Lab 05/15/19 0526 05/15/19 0526 05/16/19 0532 05/16/19 0532 05/17/19 0238 05/18/19 0235 05/19/19 0229  NA 140  --  135  --  137 134* 134*  K 4.7  --  5.1  --  4.5 4.4 4.6  CL 102  --  96*  --  100 97* 96*  CO2 26  --  26  --  27 27 26   GLUCOSE 130*  --  126*  --  131* 115* 117*  BUN 104*  --  153*  --  74* 117* 160*  CREATININE 4.05*  --  5.19*  --  3.06* 4.39* 5.56*  CALCIUM 7.9*   < > 8.3*   < > 8.0* 8.0* 8.2*  MG 2.2  --  2.5*  --  2.1 2.2 2.3  PHOS 6.2*  --  6.4*  --  4.7* 5.3* 6.3*   < > = values in this interval not displayed.    Liver Function Tests: Recent Labs  Lab 05/12/19 2041 05/13/19 0344 05/15/19 0526 05/16/19 0532 05/17/19 0238 05/18/19 0235 05/19/19 0229  AST 22  --   --   --   --   --   --   ALT 16  --   --   --   --   --   --   ALKPHOS 68  --   --   --   --   --   --   BILITOT 1.2  --   --   --   --   --   --  PROT 5.1*  --   --   --   --   --   --   ALBUMIN 2.6*   < > 2.2* 2.2* 2.1* 2.0* 2.1*   < > = values in this interval not displayed.   No results for input(s): LIPASE, AMYLASE in the last 168 hours. No results for input(s): AMMONIA in the last 168 hours.  CBC: Recent Labs  Lab 05/15/19 0526 05/16/19 0532 05/17/19 0618 05/18/19 0235 05/19/19 0229  WBC 10.0 7.1 6.6 6.8 6.8  HGB 11.0* 11.2* 11.4* 10.7* 10.9*  HCT 36.4* 36.4* 37.4* 34.4* 35.4*  MCV 94.1 93.8 93.3 92.7 93.7  PLT 86* 78* 77* 84* 92*    Cardiac Enzymes: No results for input(s): CKTOTAL, CKMB, CKMBINDEX, TROPONINI in the last 168 hours.  BNP: Invalid input(s): POCBNP  CBG: Recent Labs  Lab 05/18/19 0611 05/18/19 1050 05/18/19 1556 05/18/19 2151 05/19/19 0625  GLUCAP 92 106* 155* 108* 102*    Microbiology: Results for orders placed or performed during the hospital encounter of 03/13/19  Respiratory Panel by RT PCR (Flu A&B, Covid) - Nasopharyngeal Swab     Status: None    Collection Time: 03/13/19  1:49 PM   Specimen: Nasopharyngeal Swab  Result Value Ref Range Status   SARS Coronavirus 2 by RT PCR NEGATIVE NEGATIVE Final    Comment: (NOTE) SARS-CoV-2 target nucleic acids are NOT DETECTED. The SARS-CoV-2 RNA is generally detectable in upper respiratoy specimens during the acute phase of infection. The lowest concentration of SARS-CoV-2 viral copies this assay can detect is 131 copies/mL. A negative result does not preclude SARS-Cov-2 infection and should not be used as the sole basis for treatment or other patient management decisions. A negative result may occur with  improper specimen collection/handling, submission of specimen other than nasopharyngeal swab, presence of viral mutation(s) within the areas targeted by this assay, and inadequate number of viral copies (<131 copies/mL). A negative result must be combined with clinical observations, patient history, and epidemiological information. The expected result is Negative. Fact Sheet for Patients:  PinkCheek.be Fact Sheet for Healthcare Providers:  GravelBags.it This test is not yet ap proved or cleared by the [place] FDA and  has been authorized for detection and/or diagnosis of SARS-CoV-2 by FDA under an Emergency Use Authorization (EUA). This EUA will remain  in effect (meaning this test can be used) for the duration of the COVID-19 declaration under Section 564(b)(1) of the Act, 21 U.S.C. section 360bbb-3(b)(1), unless the authorization is terminated or revoked sooner.    Influenza A by PCR NEGATIVE NEGATIVE Final   Influenza B by PCR NEGATIVE NEGATIVE Final    Comment: (NOTE) The Xpert Xpress SARS-CoV-2/FLU/RSV assay is intended as an aid in  the diagnosis of influenza from Nasopharyngeal swab specimens and  should not be used as a sole basis for treatment. Nasal washings and  aspirates are unacceptable for Xpert Xpress  SARS-CoV-2/FLU/RSV  testing. Fact Sheet for Patients: PinkCheek.be Fact Sheet for Healthcare Providers: GravelBags.it This test is not yet approved or cleared by the [place] FDA and  has been authorized for detection and/or diagnosis of SARS-CoV-2 by  FDA under an Emergency Use Authorization (EUA). This EUA will remain  in effect (meaning this test can be used) for the duration of the  Covid-19 declaration under Section 564(b)(1) of the Act, 21  U.S.C. section 360bbb-3(b)(1), unless the authorization is  terminated or revoked. Performed at [facility], [address], [place], [place] [address]  MRSA PCR Screening     Status: None   Collection Time: 03/13/19  5:18 PM   Specimen: Nasal Mucosa; Nasopharyngeal  Result Value Ref Range Status   MRSA by PCR NEGATIVE NEGATIVE Final    Comment:        The GeneXpert MRSA Assay (FDA approved for NASAL specimens only), is one component of a comprehensive MRSA colonization surveillance program. It is not intended to diagnose MRSA infection nor to guide or monitor treatment for MRSA infections. Performed at [facility], [address], [place], [place] [address]   Surgical pcr screen     Status: None   Collection Time: 03/15/19  9:28 PM   Specimen: Nasal Mucosa; Nasal Swab  Result Value Ref Range Status   MRSA, PCR NEGATIVE NEGATIVE Final   Staphylococcus aureus NEGATIVE NEGATIVE Final    Comment: (NOTE) The Xpert SA Assay (FDA approved for NASAL specimens in patients 57 years of age and older), is one component of a comprehensive surveillance program. It is not intended to diagnose infection nor to guide or monitor treatment. Performed at [facility], [place] [address], [place], [place] [address]   Culture, respiratory (non-expectorated)     Status: None   Collection Time: 03/19/19  3:00 PM   Specimen: Tracheal Aspirate; Respiratory  Result Value Ref Range Status    Specimen Description TRACHEAL ASPIRATE  Final   Special Requests Normal  Final   Gram Stain   Final    ABUNDANT WBC PRESENT,BOTH PMN AND MONONUCLEAR FEW GRAM POSITIVE COCCI RARE GRAM NEGATIVE RODS    Culture   Final    FEW Consistent with normal respiratory flora. Performed at [facility], [place] [address], [place], [place] [address]    Report Status 03/21/2019 FINAL  Final  Culture, respiratory (non-expectorated)     Status: None   Collection Time: 03/26/19  6:29 AM   Specimen: Tracheal Aspirate; Respiratory  Result Value Ref Range Status   Specimen Description TRACHEAL ASPIRATE  Final   Special Requests NONE  Final   Gram Stain   Final    ABUNDANT WBC PRESENT,BOTH PMN AND MONONUCLEAR NO ORGANISMS SEEN Performed at [facility], [address], [place], [place] [address]    Culture FEW PSEUDOMONAS AERUGINOSA  Final   Report Status 03/28/2019 FINAL  Final   Organism ID, Bacteria PSEUDOMONAS AERUGINOSA  Final      Susceptibility   Pseudomonas aeruginosa - MIC*    CEFTAZIDIME 8 SENSITIVE Sensitive     CIPROFLOXACIN <=0.25 SENSITIVE Sensitive     GENTAMICIN 4 SENSITIVE Sensitive     IMIPENEM 2 SENSITIVE Sensitive     * FEW PSEUDOMONAS AERUGINOSA  Culture, respiratory (non-expectorated)     Status: None   Collection Time: 04/03/19 11:08 AM   Specimen: Tracheal Aspirate; Respiratory  Result Value Ref Range Status   Specimen Description TRACHEAL ASPIRATE  Final   Special Requests NONE  Final   Gram Stain   Final    FEW WBC PRESENT, PREDOMINANTLY PMN FEW GRAM NEGATIVE RODS RARE YEAST    Culture   Final    FEW PSEUDOMONAS AERUGINOSA FEW CANDIDA TROPICALIS    Report Status 04/06/2019 FINAL  Final   Organism ID, Bacteria PSEUDOMONAS AERUGINOSA  Final      Susceptibility   Pseudomonas aeruginosa - MIC*    CEFTAZIDIME 32 RESISTANT Resistant     CIPROFLOXACIN <=0.25 SENSITIVE Sensitive     GENTAMICIN 4 SENSITIVE Sensitive     IMIPENEM  Value in next row  Sensitive      2 SENSITIVEPerformed at [place] [address] [place], [place] [address]    * FEW PSEUDOMONAS AERUGINOSA  Culture, blood (Routine X 2) w Reflex to ID Panel     Status: None   Collection Time: 04/03/19 12:30 PM   Specimen: BLOOD LEFT HAND  Result Value Ref Range Status   Specimen Description BLOOD LEFT HAND  Final   Special Requests   Final    BOTTLES DRAWN AEROBIC ONLY Blood Culture results may not be optimal due to an inadequate volume of blood received in culture bottles   Culture   Final    NO GROWTH 5 DAYS Performed at [facility], [place] [address], [place], [place] [address]    Report Status 04/08/2019 FINAL  Final  Culture, blood (Routine X 2) w Reflex to ID Panel     Status: None   Collection Time: 04/03/19 12:35 PM   Specimen: BLOOD LEFT HAND  Result Value Ref Range Status   Specimen Description BLOOD LEFT HAND  Final   Special Requests   Final    BOTTLES DRAWN AEROBIC ONLY Blood Culture results may not be optimal due to an inadequate volume of blood received in culture bottles   Culture   Final    NO GROWTH 5 DAYS Performed at [facility], [place] [address] [place], [place] [address]    Report Status 04/08/2019 FINAL  Final  Culture, respiratory (non-expectorated)     Status: None   Collection Time: 04/07/19 11:10 AM   Specimen: Tracheal Aspirate; Respiratory  Result Value Ref Range Status   Specimen Description TRACHEAL ASPIRATE  Final   Special Requests NONE  Final   Gram Stain   Final    MODERATE WBC PRESENT, PREDOMINANTLY PMN ABUNDANT GRAM NEGATIVE RODS Performed at [facility], [place] [address], [place], [place] [address]    Culture ABUNDANT PSEUDOMONAS AERUGINOSA  Final   Report Status 04/09/2019 FINAL  Final   Organism ID, Bacteria PSEUDOMONAS AERUGINOSA  Final      Susceptibility   Pseudomonas aeruginosa - MIC*    CEFTAZIDIME 16 INTERMEDIATE Intermediate     CIPROFLOXACIN <=0.25 SENSITIVE Sensitive      GENTAMICIN 4 SENSITIVE Sensitive     IMIPENEM 1 SENSITIVE Sensitive     * ABUNDANT PSEUDOMONAS AERUGINOSA  Culture, blood (routine x 2)     Status: None   Collection Time: 04/07/19  2:07 PM   Specimen: BLOOD LEFT HAND  Result Value Ref Range Status   Specimen Description BLOOD LEFT HAND  Final   Special Requests   Final    BOTTLES DRAWN AEROBIC ONLY Blood Culture adequate volume Performed at [facility], [place] [address] [place], [place] [address]    Culture NO GROWTH 5 DAYS  Final   Report Status 04/12/2019 FINAL  Final  Culture, blood (routine x 2)     Status: None   Collection Time: 04/07/19  2:07 PM   Specimen: BLOOD LEFT HAND  Result Value Ref Range Status   Specimen Description BLOOD LEFT HAND  Final   Special Requests   Final    BOTTLES DRAWN AEROBIC ONLY Blood Culture adequate volume Performed at [facility], [place] [address], [place],  [address]    Culture NO GROWTH 5 DAYS  Final   Report Status 04/12/2019 FINAL  Final  Culture, Urine     Status: None  Collection Time: 04/13/19  6:58 PM   Specimen: Urine, Catheterized  Result Value Ref Range Status   Specimen Description URINE, CATHETERIZED  Final   Special Requests NONE  Final   Culture   Final    NO GROWTH Performed at [facility], [address] [place], [place] [address]    Report Status 04/14/2019 FINAL  Final  MRSA PCR Screening     Status: None   Collection Time: 04/15/19 12:53 PM   Specimen: Nasal Mucosa; Nasopharyngeal  Result Value Ref Range Status   MRSA by PCR NEGATIVE NEGATIVE Final    Comment:        The GeneXpert MRSA Assay (FDA approved for NASAL specimens only), is one component of a comprehensive MRSA colonization surveillance program. It is not intended to diagnose MRSA infection nor to guide or monitor treatment for MRSA infections. Performed at [facility], [place] [address], [place], [place] [address]   Culture, respiratory (non-expectorated)      Status: None   Collection Time: 04/20/19 11:35 AM   Specimen: Tracheal Aspirate; Respiratory  Result Value Ref Range Status   Specimen Description TRACHEAL ASPIRATE  Final   Special Requests Normal  Final   Gram Stain   Final    RARE WBC PRESENT, PREDOMINANTLY PMN FEW GRAM NEGATIVE RODS FEW GRAM POSITIVE RODS Performed at [facility], [place] [address] [place], [place] [address]    Culture MODERATE PSEUDOMONAS AERUGINOSA  Final   Report Status 04/23/2019 FINAL  Final   Organism ID, Bacteria PSEUDOMONAS AERUGINOSA  Final      Susceptibility   Pseudomonas aeruginosa - MIC*    CEFTAZIDIME >=64 RESISTANT Resistant     CIPROFLOXACIN <=0.25 SENSITIVE Sensitive     GENTAMICIN 4 SENSITIVE Sensitive     IMIPENEM >=16 RESISTANT Resistant     * MODERATE PSEUDOMONAS AERUGINOSA  Body fluid culture     Status: None   Collection Time: 04/23/19 12:02 PM   Specimen: Pleural, Left; Body Fluid  Result Value Ref Range Status   Specimen Description PLEURAL LEFT  Final   Special Requests NONE  Final   Gram Stain   Final    ABUNDANT WBC PRESENT,BOTH PMN AND MONONUCLEAR NO ORGANISMS SEEN    Culture   Final    NO GROWTH 3 DAYS Performed at [facility], [address], [place], [place] [address]    Report Status 04/26/2019 FINAL  Final    Coagulation Studies: No results for input(s): LABPROT, INR in the last 72 hours.  Urinalysis: No results for input(s): COLORURINE, LABSPEC, PHURINE, GLUCOSEU, HGBUR, BILIRUBINUR, KETONESUR, PROTEINUR, UROBILINOGEN, NITRITE, LEUKOCYTESUR in the last 72 hours.  Invalid input(s): APPERANCEUR    Imaging: No results found.   Medications:   . sodium chloride    . sodium chloride    . sodium chloride Stopped (05/08/19 1833)  . anticoagulant sodium citrate     . sodium chloride   Intravenous Once  . aspirin  81 mg Oral Daily  . calcium acetate (Phos Binder)  667 mg Per Tube TID WC  . chlorhexidine  15 mL Mouth Rinse BID  . Chlorhexidine  Gluconate Cloth  6 each Topical Daily  . Chlorhexidine Gluconate Cloth  6 each Topical Q0600  . Chlorhexidine Gluconate Cloth  6 each Topical Q0600  . famotidine  10 mg Per Tube Daily  . feeding supplement (NEPRO CARB STEADY)  237 mL Per Tube QID  . feeding supplement (PRO-STAT SUGAR FREE  64)  60 mL Per Tube TID  . Gerhardt's butt cream   Topical BID  . hydrocortisone   Rectal BID  . hydrocortisone  25 mg Rectal BID  . insulin aspart  0-15 Units Subcutaneous TID WC  . insulin aspart  0-5 Units Subcutaneous QHS  . magic mouthwash  5 mL Oral TID  . mouth rinse  15 mL Mouth Rinse q12n4p  . metoCLOPramide  5 mg Oral TID AC  . multivitamin  1 tablet Per Tube QHS  . simethicone  80 mg Oral TID  . sodium chloride flush  10-40 mL Intracatheter Q12H  . sodium chloride flush  10-40 mL Intracatheter Q12H   sodium chloride, sodium chloride, oxyCODONE **AND** acetaminophen, dextrose, hydrOXYzine, hyoscyamine, levalbuterol, lidocaine (PF), lidocaine-prilocaine, loperamide HCl, ondansetron (ZOFRAN) IV, opium-belladonna, pentafluoroprop-tetrafluoroeth, promethazine, Resource ThickenUp Clear, sodium chloride flush, sodium chloride flush, sodium chloride flush  Assessment/ Plan:  1. Acute kidney Injury: Appears hemodynamically mediated with ATN following postoperative congestive heart failure/cardiogenic shock.  On renal replacement therapy since 2/11 and appears to be tolerating intermittent hemodialysis without problems.  He does not have any renal recovery, although did have 200 cc of urine output 05/17/2019, will continue dialysis on a TTS schedule.  He does have fairly marked azotemia with a BUN predialysis of 160.  This could be secondary to his steroids.  It appears that he underwent dialysis 05/17/2019 with removal of 275 cc.  No heparin.  Lock the ports with citrate.  HIT test pending.  We will schedule dialysis for 05/19/2019.  His next scheduled dialysis will be for Tuesday, 05/21/2019. 2.  Acute systolic  heart failure/cardiogenic shock: Status post Impella support.  He continues to tolerate hemodialysis without midodrine. 3.  Coronary artery disease status post three-vessel CABG 4.  Anemia: Secondary to acute/critical illness and acute blood loss with ongoing hematuria-off Aranesp for suspicion that this was inducing drug rash.    Does not appear to be an issue at this time of discontinued his Retacrit 05/16/2019 5.  Nutrition: Low albumin likely negative acute phase reactant, monitor with ongoing diet/ONS. 6.  Secondary hyperparathyroidism: Calcium and phosphorus levels currently at goal, monitor with HD. 7.  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  Hyperkalemia.  Resolved.  9.  Diffuse rash   now tapering empiric steroids.  LOS: [place] @TODAY @6 :31 AM

## 2019-05-19 NOTE — Progress Notes (Addendum)
[id number]       [place] [address]             [phone]      6 Days Post-Op Procedure(s) (LRB): CYSTOSCOPY CLOT EVACUATION FULGRATION CYSTOGRAM AND INSTILLATION OF FORMALIN (N/A) Subjective: Feels fair  Objective: Vital signs in last 24 hours: Temp:  [97.5 F (36.4 C)-98 F (36.7 C)] 97.6 F (36.4 C) (04/03 0726) Pulse Rate:  [69-74] 72 (04/03 0251) Cardiac Rhythm: Normal sinus rhythm;Bundle branch block (04/02 1900) Resp:  [13-24] 16 (04/03 0251) BP: (115-134)/(45-52) 134/50 (04/03 0251) SpO2:  [93 %-96 %] 96 % (04/03 0251) Weight:  [87.2 kg] 87.2 kg (04/03 0303)  Hemodynamic parameters for last 24 hours:    Intake/Output from previous day: 04/02 0701 - 04/03 0700 In: 727 [P.O.:230; NG/GT:497] Out: 1 [Stool:1] Intake/Output this shift: No intake/output data recorded.  General appearance: alert, cooperative, fatigued and no distress Heart: regular rate and rhythm Lungs: clear to auscultation bilaterally Abdomen: soft, nontender or distended Extremities: minor edema Wound: incis healing well  Lab Results: Recent Labs    05/18/19 0235 05/19/19 0229  WBC 6.8 6.8  HGB 10.7* 10.9*  HCT 34.4* 35.4*  PLT 84* 92*   BMET:  Recent Labs    05/18/19 0235 05/19/19 0229  NA 134* 134*  K 4.4 4.6  CL 97* 96*  CO2 27 26  GLUCOSE 115* 117*  BUN 117* 160*  CREATININE 4.39* 5.56*  CALCIUM 8.0* 8.2*    PT/INR: No results for input(s): LABPROT, INR in the last 72 hours. ABG    Component Value Date/Time   PHART 7.369 05/10/2019 1804   HCO3 20.0 05/10/2019 1804   TCO2 21 (L) 05/10/2019 1804   ACIDBASEDEF 5.0 (H) 05/10/2019 1804   O2SAT 99.0 05/10/2019 1804   CBG (last 3)  Recent Labs    05/18/19 1556 05/18/19 2151 05/19/19 0625  GLUCAP 155* 108* 102*    Meds Scheduled Meds: . sodium chloride   Intravenous Once  . aspirin  81 mg Oral Daily  . calcium acetate (Phos Binder)  667 mg Per Tube TID WC  . chlorhexidine  15 mL  Mouth Rinse BID  . Chlorhexidine Gluconate Cloth  6 each Topical Daily  . Chlorhexidine Gluconate Cloth  6 each Topical Q0600  . Chlorhexidine Gluconate Cloth  6 each Topical Q0600  . famotidine  10 mg Per Tube Daily  . feeding supplement (NEPRO CARB STEADY)  237 mL Per Tube QID  . feeding supplement (PRO-STAT SUGAR FREE 64)  60 mL Per Tube TID  . Gerhardt's butt cream   Topical BID  . hydrocortisone   Rectal BID  . hydrocortisone  25 mg Rectal BID  . insulin aspart  0-15 Units Subcutaneous TID WC  . insulin aspart  0-5 Units Subcutaneous QHS  . magic mouthwash  5 mL Oral TID  . mouth rinse  15 mL Mouth Rinse q12n4p  . metoCLOPramide  5 mg Oral TID AC  . multivitamin  1 tablet Per Tube QHS  . simethicone  80 mg Oral TID  . sodium chloride flush  10-40 mL Intracatheter Q12H  . sodium chloride flush  10-40 mL Intracatheter Q12H   Continuous Infusions: . sodium chloride    . sodium chloride    . sodium chloride Stopped (05/08/19 1833)  . anticoagulant sodium citrate     PRN Meds:.sodium chloride, sodium chloride, oxyCODONE **AND** acetaminophen, dextrose, hydrOXYzine, hyoscyamine, levalbuterol, lidocaine (PF), lidocaine-prilocaine, loperamide HCl, ondansetron (  ZOFRAN) IV, opium-belladonna, pentafluoroprop-tetrafluoroeth, promethazine, Resource ThickenUp Clear, sodium chloride flush, sodium chloride flush, sodium chloride flush  Xrays DG Chest Port 1 View  Result Date: 05/19/2019 CLINICAL DATA:  Atrial fibrillation with rapid ventricular response. History of CABG. EXAM: PORTABLE CHEST 1 VIEW COMPARISON:  Chest x-rays dated 05/14/2019 and 05/13/2019. FINDINGS: Support apparatus appears stable in position. Heart size and mediastinal contours are stable. Probable mild atelectasis and/or small pleural effusion at the LEFT lung base, but improved compared to the previous study. No pneumothorax is seen. IMPRESSION: 1. Improved aeration at both lung bases. Persistent mild atelectasis and/or small  pleural effusion at the LEFT lung base, but improved. 2. Support apparatus appears stable in position. Electronically Signed   By: Franki Cabot M.D.   On: 05/19/2019 07:57    Assessment/Plan: S/P Procedure(s) (LRB): CYSTOSCOPY CLOT EVACUATION FULGRATION CYSTOGRAM AND INSTILLATION OF FORMALIN (N/A)  1 pretty stable overall 2 refusing nutrition supplements d/t discomfort- may need TF's re-initiated if calorie intake doesn't improve 3 hemodyn stable in sinus rhythm 4 ESRD/renal issues as per nephrology 5 platelets stable off heparin, HIT assay 0.087 6 CIR next wek   LOS: 66 days    John Giovanni PA-C Pager [phone] 05/19/2019 Patient seen and examined, agree with plan as outlined above  Remo Lipps C. Roxan Hockey, MD [facility] [phone]

## 2019-05-20 LAB — CBC
HCT: 35.6 % — ABNORMAL LOW (ref 39.0–52.0)
Hemoglobin: 11 g/dL — ABNORMAL LOW (ref 13.0–17.0)
MCH: 28.6 pg (ref 26.0–34.0)
MCHC: 30.9 g/dL (ref 30.0–36.0)
MCV: 92.7 fL (ref 80.0–100.0)
Platelets: 87 10*3/uL — ABNORMAL LOW (ref 150–400)
RBC: 3.84 MIL/uL — ABNORMAL LOW (ref 4.22–5.81)
RDW: 19 % — ABNORMAL HIGH (ref 11.5–15.5)
WBC: 7.1 10*3/uL (ref 4.0–10.5)
nRBC: 0 % (ref 0.0–0.2)

## 2019-05-20 LAB — RENAL FUNCTION PANEL
Albumin: 2 g/dL — ABNORMAL LOW (ref 3.5–5.0)
Anion gap: 9 (ref 5–15)
BUN: 86 mg/dL — ABNORMAL HIGH (ref 8–23)
CO2: 27 mmol/L (ref 22–32)
Calcium: 8 mg/dL — ABNORMAL LOW (ref 8.9–10.3)
Chloride: 99 mmol/L (ref 98–111)
Creatinine, Ser: 4.13 mg/dL — ABNORMAL HIGH (ref 0.61–1.24)
GFR calc Af Amer: 14 mL/min — ABNORMAL LOW (ref >60)
GFR calc non Af Amer: 12 mL/min — ABNORMAL LOW (ref >60)
Glucose, Bld: 108 mg/dL — ABNORMAL HIGH (ref 70–99)
Phosphorus: 4.5 mg/dL (ref 2.5–4.6)
Potassium: 4 mmol/L (ref 3.5–5.1)
Sodium: 135 mmol/L (ref 135–145)

## 2019-05-20 LAB — GLUCOSE, CAPILLARY
Glucose-Capillary: 91 mg/dL (ref 70–99)
Glucose-Capillary: 84 mg/dL (ref 70–99)
Glucose-Capillary: 96 mg/dL (ref 70–99)
Glucose-Capillary: 124 mg/dL — ABNORMAL HIGH (ref 70–99)

## 2019-05-20 LAB — MAGNESIUM: Magnesium: 2.1 mg/dL (ref 1.7–2.4)

## 2019-05-20 MED ORDER — DIPHENHYDRAMINE HCL 25 MG PO CAPS: 25.0000 mg | ORAL_CAPSULE | Freq: Four times a day (QID) | ORAL | Status: DC | PRN

## 2019-05-20 MED ORDER — HYDROCORTISONE 1 % EX CREA
TOPICAL_CREAM | Freq: Two times a day (BID) | CUTANEOUS | Status: DC
  Filled 2019-05-20: qty 28

## 2019-05-20 NOTE — Progress Notes (Signed)
[facility] ROUNDING NOTE   Subjective:   This is an 86 year old gentleman with a fairly long hospitalization developed acute systolic heart failure cardiogenic shock requiring Impella support post CABG postop ejection fraction 40% Impella removed 04/10/2019.  Status post CABG this admit.  History of paroxysmal atrial fibrillation status post maze LAA occlusion.  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 Gloriann Loan.  Now dialysis dependent since 04/08/2019.  Tolerated dialysis 05/16/2019 with removal of 1500 cc.  Is scheduled for dialysis 05/18/2019.  No heparin with dialysis.  Lock the dialysis ports with citrate.  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   Blood pressure 123/51 pulse 69 temperature 97.7 O2 sats at 98% 1 L nasal cannula  Sodium 135 potassium 4 chloride 99 CO2 27 BUN 86 creatinine 4.13 glucose 108 calcium 8 phosphorus 4.5 magnesium 2.1 albumin 2 hemoglobin 11 platelets 87  Aspirin 81 mg daily Phoslyra 660 mg 3 times daily,  Pepcid 10 mg daily 1, , Reglan 5 mg 3 times daily  Rena-Vite 1 daily    Objective:  Vital signs in last 24 hours:  Temp:  [97.6 F (36.4 C)-98.3 F (36.8 C)] 97.7 F (36.5 C) (04/04 0300) Pulse Rate:  [72-92] 76 (04/04 0300) Resp:  [12-22] 12 (04/04 0300) BP: (83-130)/(48-68) 123/51 (04/04 0300) SpO2:  [92 %-96 %] 96 % (04/04 0300) Weight:  [85.2 kg-87.5 kg] 87.5 kg (04/04 0400)  Weight change: -1.6 kg Filed Weights   05/19/19 1225 05/19/19 1507 05/20/19 0400  Weight: 85.6 kg 85.2 kg 87.5 kg    Intake/Output: I/O last 3 completed shifts: In: 026 [P.O.:230; I.V.:10; NG/GT:527] Out: 186 [Other:185; Stool:1]   Intake/Output this shift:  Total I/O In: 5 [IV Piggyback:5] Out: -   Gen: Comfortably resting in bed, being cleaned up by nurse. CVS: Pulse regular rhythm, normal rate, S1 and S2 normal Resp: Anteriorly clear to auscultation, no rales.  Left IJ TDC Abd:  Soft, obese, nontender Ext: Trace dependent edema.   Basic Metabolic Panel: Recent Labs  Lab 05/16/19 0532 05/16/19 0532 05/17/19 3785 05/17/19 0238 05/18/19 0235 05/19/19 0229 05/20/19 0257  NA 135  --  137  --  134* 134* 135  K 5.1  --  4.5  --  4.4 4.6 4.0  CL 96*  --  100  --  97* 96* 99  CO2 26  --  27  --  27 26 27   GLUCOSE 126*  --  131*  --  115* 117* 108*  BUN 153*  --  74*  --  117* 160* 86*  CREATININE 5.19*  --  3.06*  --  4.39* 5.56* 4.13*  CALCIUM 8.3*   < > 8.0*   < > 8.0* 8.2* 8.0*  MG 2.5*  --  2.1  --  2.2 2.3 2.1  PHOS 6.4*  --  4.7*  --  5.3* 6.3* 4.5   < > = values in this interval not displayed.    Liver Function Tests: Recent Labs  Lab 05/16/19 0532 05/17/19 0238 05/18/19 0235 05/19/19 0229 05/20/19 0257  ALBUMIN 2.2* 2.1* 2.0* 2.1* 2.0*   No results for input(s): LIPASE, AMYLASE in the last 168 hours. No results for input(s): AMMONIA in the last 168 hours.  CBC: Recent Labs  Lab 05/16/19 0532 05/17/19 0618 05/18/19 0235 05/19/19 0229 05/20/19 0257  WBC 7.1 6.6 6.8 6.8 7.1  HGB 11.2* 11.4* 10.7* 10.9* 11.0*  HCT 36.4* 37.4* 34.4* 35.4* 35.6*  MCV 93.8  93.3 92.7 93.7 92.7  PLT 78* 77* 84* 92* 87*    Cardiac Enzymes: No results for input(s): CKTOTAL, CKMB, CKMBINDEX, TROPONINI in the last 168 hours.  BNP: Invalid input(s): POCBNP  CBG: Recent Labs  Lab 05/19/19 0625 05/19/19 1050 05/19/19 1611 05/19/19 2107 05/20/19 0621  GLUCAP 102* 92 101* 91 91    Microbiology: Results for orders placed or performed during the hospital encounter of 03/13/19  Respiratory Panel by RT PCR (Flu A&B, Covid) - Nasopharyngeal Swab     Status: None   Collection Time: 03/13/19  1:49 PM   Specimen: Nasopharyngeal Swab  Result Value Ref Range Status   SARS Coronavirus 2 by RT PCR NEGATIVE NEGATIVE Final    Comment: (NOTE) SARS-CoV-2 target nucleic acids are NOT DETECTED. The SARS-CoV-2 RNA is generally detectable in upper  respiratoy specimens during the acute phase of infection. The lowest concentration of SARS-CoV-2 viral copies this assay can detect is 131 copies/mL. A negative result does not preclude SARS-Cov-2 infection and should not be used as the sole basis for treatment or other patient management decisions. A negative result may occur with  improper specimen collection/handling, submission of specimen other than nasopharyngeal swab, presence of viral mutation(s) within the areas targeted by this assay, and inadequate number of viral copies (<131 copies/mL). A negative result must be combined with clinical observations, patient history, and epidemiological information. The expected result is Negative. Fact Sheet for Patients:  PinkCheek.be Fact Sheet for Healthcare Providers:  GravelBags.it This test is not yet ap proved or cleared by the [place] FDA and  has been authorized for detection and/or diagnosis of SARS-CoV-2 by FDA under an Emergency Use Authorization (EUA). This EUA will remain  in effect (meaning this test can be used) for the duration of the COVID-19 declaration under Section 564(b)(1) of the Act, 21 U.S.C. section 360bbb-3(b)(1), unless the authorization is terminated or revoked sooner.    Influenza A by PCR NEGATIVE NEGATIVE Final   Influenza B by PCR NEGATIVE NEGATIVE Final    Comment: (NOTE) The Xpert Xpress SARS-CoV-2/FLU/RSV assay is intended as an aid in  the diagnosis of influenza from Nasopharyngeal swab specimens and  should not be used as a sole basis for treatment. Nasal washings and  aspirates are unacceptable for Xpert Xpress SARS-CoV-2/FLU/RSV  testing. Fact Sheet for Patients: PinkCheek.be Fact Sheet for Healthcare Providers: GravelBags.it This test is not yet approved or cleared by the [place] FDA and  has been authorized for  detection and/or diagnosis of SARS-CoV-2 by  FDA under an Emergency Use Authorization (EUA). This EUA will remain  in effect (meaning this test can be used) for the duration of the  Covid-19 declaration under Section 564(b)(1) of the Act, 21  U.S.C. section 360bbb-3(b)(1), unless the authorization is  terminated or revoked. Performed at [facility], [address] [place], [place] [address]   MRSA PCR Screening     Status: None   Collection Time: 03/13/19  5:18 PM   Specimen: Nasal Mucosa; Nasopharyngeal  Result Value Ref Range Status   MRSA by PCR NEGATIVE NEGATIVE Final    Comment:        The GeneXpert MRSA Assay (FDA approved for NASAL specimens only), is one component of a comprehensive MRSA colonization surveillance program. It is not intended to diagnose MRSA infection nor to guide or monitor treatment for MRSA infections. Performed at [facility], [address], [place], [place] [address]   Surgical pcr screen     Status:  None   Collection Time: 03/15/19  9:28 PM   Specimen: Nasal Mucosa; Nasal Swab  Result Value Ref Range Status   MRSA, PCR NEGATIVE NEGATIVE Final   Staphylococcus aureus NEGATIVE NEGATIVE Final    Comment: (NOTE) The Xpert SA Assay (FDA approved for NASAL specimens in patients 53 years of age and older), is one component of a comprehensive surveillance program. It is not intended to diagnose infection nor to guide or monitor treatment. Performed at [facility], [place] [address] [place], [place] [address]   Culture, respiratory (non-expectorated)     Status: None   Collection Time: 03/19/19  3:00 PM   Specimen: Tracheal Aspirate; Respiratory  Result Value Ref Range Status   Specimen Description TRACHEAL ASPIRATE  Final   Special Requests Normal  Final   Gram Stain   Final    ABUNDANT WBC PRESENT,BOTH PMN AND MONONUCLEAR FEW GRAM POSITIVE COCCI RARE GRAM NEGATIVE RODS    Culture   Final    FEW Consistent with normal respiratory  flora. Performed at [facility], [place] [address] [place], [place] [address]    Report Status 03/21/2019 FINAL  Final  Culture, respiratory (non-expectorated)     Status: None   Collection Time: 03/26/19  6:29 AM   Specimen: Tracheal Aspirate; Respiratory  Result Value Ref Range Status   Specimen Description TRACHEAL ASPIRATE  Final   Special Requests NONE  Final   Gram Stain   Final    ABUNDANT WBC PRESENT,BOTH PMN AND MONONUCLEAR NO ORGANISMS SEEN Performed at [facility], [address], [place], [place] [address]    Culture FEW PSEUDOMONAS AERUGINOSA  Final   Report Status 03/28/2019 FINAL  Final   Organism ID, Bacteria PSEUDOMONAS AERUGINOSA  Final      Susceptibility   Pseudomonas aeruginosa - MIC*    CEFTAZIDIME 8 SENSITIVE Sensitive     CIPROFLOXACIN <=0.25 SENSITIVE Sensitive     GENTAMICIN 4 SENSITIVE Sensitive     IMIPENEM 2 SENSITIVE Sensitive     * FEW PSEUDOMONAS AERUGINOSA  Culture, respiratory (non-expectorated)     Status: None   Collection Time: 04/03/19 11:08 AM   Specimen: Tracheal Aspirate; Respiratory  Result Value Ref Range Status   Specimen Description TRACHEAL ASPIRATE  Final   Special Requests NONE  Final   Gram Stain   Final    FEW WBC PRESENT, PREDOMINANTLY PMN FEW GRAM NEGATIVE RODS RARE YEAST    Culture   Final    FEW PSEUDOMONAS AERUGINOSA FEW CANDIDA TROPICALIS    Report Status 04/06/2019 FINAL  Final   Organism ID, Bacteria PSEUDOMONAS AERUGINOSA  Final      Susceptibility   Pseudomonas aeruginosa - MIC*    CEFTAZIDIME 32 RESISTANT Resistant     CIPROFLOXACIN <=0.25 SENSITIVE Sensitive     GENTAMICIN 4 SENSITIVE Sensitive     IMIPENEM Value in next row Sensitive      2 SENSITIVEPerformed at [facility], [address] [place], [place] [address]    * FEW PSEUDOMONAS AERUGINOSA  Culture, blood (Routine X 2) w Reflex to ID Panel     Status: None   Collection Time: 04/03/19 12:30 PM   Specimen: BLOOD LEFT HAND   Result Value Ref Range Status   Specimen Description BLOOD LEFT HAND  Final   Special Requests   Final    BOTTLES DRAWN AEROBIC ONLY Blood Culture results may not be optimal due to an inadequate volume of blood received  in culture bottles   Culture   Final    NO GROWTH 5 DAYS Performed at [facility], [place] [address], [place], [place] [address]    Report Status 04/08/2019 FINAL  Final  Culture, blood (Routine X 2) w Reflex to ID Panel     Status: None   Collection Time: 04/03/19 12:35 PM   Specimen: BLOOD LEFT HAND  Result Value Ref Range Status   Specimen Description BLOOD LEFT HAND  Final   Special Requests   Final    BOTTLES DRAWN AEROBIC ONLY Blood Culture results may not be optimal due to an inadequate volume of blood received in culture bottles   Culture   Final    NO GROWTH 5 DAYS Performed at [facility], [place] [address] [place], [place] [address]    Report Status 04/08/2019 FINAL  Final  Culture, respiratory (non-expectorated)     Status: None   Collection Time: 04/07/19 11:10 AM   Specimen: Tracheal Aspirate; Respiratory  Result Value Ref Range Status   Specimen Description TRACHEAL ASPIRATE  Final   Special Requests NONE  Final   Gram Stain   Final    MODERATE WBC PRESENT, PREDOMINANTLY PMN ABUNDANT GRAM NEGATIVE RODS Performed at [facility], [place] [address], [place], [place] [address]    Culture ABUNDANT PSEUDOMONAS AERUGINOSA  Final   Report Status 04/09/2019 FINAL  Final   Organism ID, Bacteria PSEUDOMONAS AERUGINOSA  Final      Susceptibility   Pseudomonas aeruginosa - MIC*    CEFTAZIDIME 16 INTERMEDIATE Intermediate     CIPROFLOXACIN <=0.25 SENSITIVE Sensitive     GENTAMICIN 4 SENSITIVE Sensitive     IMIPENEM 1 SENSITIVE Sensitive     * ABUNDANT PSEUDOMONAS AERUGINOSA  Culture, blood (routine x 2)     Status: None   Collection Time: 04/07/19  2:07 PM   Specimen: BLOOD LEFT HAND  Result Value Ref Range Status   Specimen Description  BLOOD LEFT HAND  Final   Special Requests   Final    BOTTLES DRAWN AEROBIC ONLY Blood Culture adequate volume Performed at [facility], [place] [address] [place], [place] [address]    Culture NO GROWTH 5 DAYS  Final   Report Status 04/12/2019 FINAL  Final  Culture, blood (routine x 2)     Status: None   Collection Time: 04/07/19  2:07 PM   Specimen: BLOOD LEFT HAND  Result Value Ref Range Status   Specimen Description BLOOD LEFT HAND  Final   Special Requests   Final    BOTTLES DRAWN AEROBIC ONLY Blood Culture adequate volume Performed at [facility], [place] [address] [place], [place] [address]    Culture NO GROWTH 5 DAYS  Final   Report Status 04/12/2019 FINAL  Final  Culture, Urine     Status: None   Collection Time: 04/13/19  6:58 PM   Specimen: Urine, Catheterized  Result Value Ref Range Status   Specimen Description URINE, CATHETERIZED  Final   Special Requests NONE  Final   Culture   Final    NO GROWTH Performed at [facility], [address], [place], [place] [address]    Report Status 04/14/2019 FINAL  Final  MRSA PCR Screening     Status: None   Collection Time: 04/15/19 12:53 PM   Specimen: Nasal Mucosa; Nasopharyngeal  Result Value Ref Range Status   MRSA by PCR NEGATIVE NEGATIVE Final  Comment:        The GeneXpert MRSA Assay (FDA approved for NASAL specimens only), is one component of a comprehensive MRSA colonization surveillance program. It is not intended to diagnose MRSA infection nor to guide or monitor treatment for MRSA infections. Performed at [facility], [place] [address], [place], [place] [address]   Culture, respiratory (non-expectorated)     Status: None   Collection Time: 04/20/19 11:35 AM   Specimen: Tracheal Aspirate; Respiratory  Result Value Ref Range Status   Specimen Description TRACHEAL ASPIRATE  Final   Special Requests Normal  Final   Gram Stain   Final    RARE WBC PRESENT, PREDOMINANTLY PMN FEW GRAM  NEGATIVE RODS FEW GRAM POSITIVE RODS Performed at [facility], [place] [address] [place], [place] [address]    Culture MODERATE PSEUDOMONAS AERUGINOSA  Final   Report Status 04/23/2019 FINAL  Final   Organism ID, Bacteria PSEUDOMONAS AERUGINOSA  Final      Susceptibility   Pseudomonas aeruginosa - MIC*    CEFTAZIDIME >=64 RESISTANT Resistant     CIPROFLOXACIN <=0.25 SENSITIVE Sensitive     GENTAMICIN 4 SENSITIVE Sensitive     IMIPENEM >=16 RESISTANT Resistant     * MODERATE PSEUDOMONAS AERUGINOSA  Body fluid culture     Status: None   Collection Time: 04/23/19 12:02 PM   Specimen: Pleural, Left; Body Fluid  Result Value Ref Range Status   Specimen Description PLEURAL LEFT  Final   Special Requests NONE  Final   Gram Stain   Final    ABUNDANT WBC PRESENT,BOTH PMN AND MONONUCLEAR NO ORGANISMS SEEN    Culture   Final    NO GROWTH 3 DAYS Performed at [facility], [address], [place],  [address]    Report Status 04/26/2019 FINAL  Final    Coagulation Studies: No results for input(s): LABPROT, INR in the last 72 hours.  Urinalysis: No results for input(s): COLORURINE, LABSPEC, PHURINE, GLUCOSEU, HGBUR, BILIRUBINUR, KETONESUR, PROTEINUR, UROBILINOGEN, NITRITE, LEUKOCYTESUR in the last 72 hours.  Invalid input(s): APPERANCEUR    Imaging: DG Chest Port 1 View  Result Date: 05/19/2019 CLINICAL DATA:  Atrial fibrillation with rapid ventricular response. History of CABG. EXAM: PORTABLE CHEST 1 VIEW COMPARISON:  Chest x-rays dated 05/14/2019 and 05/13/2019. FINDINGS: Support apparatus appears stable in position. Heart size and mediastinal contours are stable. Probable mild atelectasis and/or small pleural effusion at the LEFT lung base, but improved compared to the previous study. No pneumothorax is seen. IMPRESSION: 1. Improved aeration at both lung bases. Persistent mild atelectasis and/or small pleural effusion at the LEFT lung base, but improved. 2. Support  apparatus appears stable in position. Electronically Signed   By: Franki Cabot M.D.   On: 05/19/2019 07:57     Medications:   . sodium chloride    . sodium chloride    . sodium chloride Stopped (05/08/19 1833)  . anticoagulant sodium citrate    . anticoagulant sodium citrate     . sodium chloride   Intravenous Once  . aspirin  81 mg Oral Daily  . calcium acetate (Phos Binder)  667 mg Per Tube TID WC  . chlorhexidine  15 mL Mouth Rinse BID  . Chlorhexidine Gluconate Cloth  6 each Topical Daily  . famotidine  10 mg Per Tube Daily  . feeding supplement (NEPRO CARB STEADY)  237 mL Per Tube QID  . feeding supplement (PRO-STAT SUGAR FREE 64)  60 mL Per  Tube TID  . Gerhardt's butt cream   Topical BID  . hydrocortisone   Rectal BID  . hydrocortisone  25 mg Rectal BID  . insulin aspart  0-15 Units Subcutaneous TID WC  . insulin aspart  0-5 Units Subcutaneous QHS  . magic mouthwash  5 mL Oral TID  . mouth rinse  15 mL Mouth Rinse q12n4p  . metoCLOPramide  5 mg Oral TID AC  . multivitamin  1 tablet Per Tube QHS  . simethicone  80 mg Oral TID  . sodium chloride flush  10-40 mL Intracatheter Q12H   sodium chloride, sodium chloride, oxyCODONE **AND** acetaminophen, dextrose, hydrOXYzine, hyoscyamine, levalbuterol, lidocaine (PF), lidocaine-prilocaine, loperamide HCl, ondansetron (ZOFRAN) IV, opium-belladonna, pentafluoroprop-tetrafluoroeth, promethazine, Resource ThickenUp Clear, sodium chloride, sodium chloride flush, sodium chloride flush  Assessment/ Plan:  1. Acute kidney Injury: Appears hemodynamically mediated with ATN following postoperative congestive heart failure/cardiogenic shock.  On renal replacement therapy since 2/11 and appears to be tolerating intermittent hemodialysis without problems.  He does not have any renal recovery, although did have 200 cc of urine output 05/17/2019, will continue dialysis on a TTS schedule.  It appears that he underwent dialysis 05/18/2021 removal of 185  cc.  No heparin.  Lock the ports with citrate.  HIT test appears negative, I have no problems with him resuming heparin..   .  His next scheduled dialysis will be for Tuesday, 05/21/2019. 2.  Acute systolic heart failure/cardiogenic shock: Status post Impella support.  He continues to tolerate hemodialysis without midodrine. 3.  Coronary artery disease status post three-vessel CABG 4.  Anemia: Secondary to acute/critical illness and acute blood loss with ongoing hematuria-off Aranesp for suspicion that this was inducing drug rash.    Does not appear to be an issue at this time of discontinued his Retacrit 05/16/2019 5.  Nutrition: Low albumin likely negative acute phase reactant, monitor with ongoing diet/ONS. 6.  Secondary hyperparathyroidism: Calcium and phosphorus levels currently at goal, monitor with HD. 7.  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  Hyperkalemia.  Resolved.  9.  Diffuse rash steroids DC'd  LOS: [place] @TODAY @6 :46 AM

## 2019-05-20 NOTE — Progress Notes (Signed)
Patient ID: Jesse Murphy, male   DOB: 04-26-1928, 86 y.o.   MRN: [id number]    Advanced Heart Failure Rounding Note  PCP-Cardiologist: Kate Sable, MD   Subjective:    Feeling a bit stronger. No further hematuria. Appetite improved. No SOB, orthopnea or PND.   Tolerating iHD.  Objective:   Weight Range: 87.5 kg Body mass index is 29.34 kg/m.   Vital Signs:   Temp:  [97.6 F (36.4 C)-98.3 F (36.8 C)] 98.2 F (36.8 C) (04/04 1315) Pulse Rate:  [72-92] 75 (04/04 0805) Resp:  [12-22] 16 (04/04 0800) BP: (105-130)/(46-72) 128/72 (04/04 1315) SpO2:  [92 %-97 %] 96 % (04/04 1315) Weight:  [85.2 kg-87.5 kg] 87.5 kg (04/04 0400) Last BM Date: 05/19/19  Weight change: Filed Weights   05/19/19 1225 05/19/19 1507 05/20/19 0400  Weight: 85.6 kg 85.2 kg 87.5 kg    Intake/Output:   Intake/Output Summary (Last 24 hours) at 05/20/2019 1407 Last data filed at 05/20/2019 0900 Gross per 24 hour  Intake 55 ml  Output 185 ml  Net -130 ml     Physical Exam   General:  Lying in bed. No resp difficulty HEENT: normal Neck: supple. no JVD. Carotids 2+ bilat; no bruits. No lymphadenopathy or thryomegaly appreciated. Cor: PMI nondisplaced. Regular rate & rhythm. No rubs, gallops or murmurs. + HD cath Lungs: clear Abdomen: soft, nontender, nondistended. No hepatosplenomegaly. No bruits or masses. Good bowel sounds. Extremities: no cyanosis, clubbing, rash, trace-1+ edema Neuro: alert & orientedx3, cranial nerves grossly intact. moves all 4 extremities w/o difficulty. Affect pleasant   Telemetry   Sinus 70-80s Personally reviewed  Labs    CBC Recent Labs    05/19/19 0229 05/20/19 0257  WBC 6.8 7.1  HGB 10.9* 11.0*  HCT 35.4* 35.6*  MCV 93.7 92.7  PLT 92* 87*   Basic Metabolic Panel Recent Labs    05/19/19 0229 05/20/19 0257  NA 134* 135  K 4.6 4.0  CL 96* 99  CO2 26 27  GLUCOSE 117* 108*  BUN 160* 86*  CREATININE 5.56* 4.13*  CALCIUM 8.2* 8.0*  MG 2.3 2.1   PHOS 6.3* 4.5   Liver Function Tests Recent Labs    05/19/19 0229 05/20/19 0257  ALBUMIN 2.1* 2.0*   No results for input(s): LIPASE, AMYLASE in the last 72 hours. Cardiac Enzymes No results for input(s): CKTOTAL, CKMB, CKMBINDEX, TROPONINI in the last 72 hours.  BNP: BNP (last 3 results) Recent Labs    03/22/19 0401 03/26/19 0626 03/27/19 1255  BNP 340.4* 687.5* 800.4*    ProBNP (last 3 results) No results for input(s): PROBNP in the last 8760 hours.   D-Dimer No results for input(s): DDIMER in the last 72 hours. Hemoglobin A1C No results for input(s): HGBA1C in the last 72 hours. Fasting Lipid Panel No results for input(s): CHOL, HDL, LDLCALC, TRIG, CHOLHDL, LDLDIRECT in the last 72 hours. Thyroid Function Tests No results for input(s): TSH, T4TOTAL, T3FREE, THYROIDAB in the last 72 hours.  Invalid input(s): FREET3  Other results:   Imaging    No results found.   Medications:     Scheduled Medications: . sodium chloride   Intravenous Once  . aspirin  81 mg Oral Daily  . calcium acetate (Phos Binder)  667 mg Per Tube TID WC  . chlorhexidine  15 mL Mouth Rinse BID  . Chlorhexidine Gluconate Cloth  6 each Topical Daily  . famotidine  10 mg Per Tube Daily  . feeding supplement (NEPRO  CARB STEADY)  237 mL Per Tube QID  . feeding supplement (PRO-STAT SUGAR FREE 64)  60 mL Per Tube TID  . Gerhardt's butt cream   Topical BID  . hydrocortisone   Rectal BID  . hydrocortisone  25 mg Rectal BID  . hydrocortisone cream   Topical BID  . insulin aspart  0-15 Units Subcutaneous TID WC  . insulin aspart  0-5 Units Subcutaneous QHS  . magic mouthwash  5 mL Oral TID  . mouth rinse  15 mL Mouth Rinse q12n4p  . metoCLOPramide  5 mg Oral TID AC  . multivitamin  1 tablet Per Tube QHS  . simethicone  80 mg Oral TID  . sodium chloride flush  10-40 mL Intracatheter Q12H    Infusions: . sodium chloride    . sodium chloride    . sodium chloride Stopped (05/08/19  1833)  . anticoagulant sodium citrate    . anticoagulant sodium citrate      PRN Medications: sodium chloride, sodium chloride, oxyCODONE **AND** acetaminophen, dextrose, diphenhydrAMINE, hydrOXYzine, hyoscyamine, levalbuterol, lidocaine (PF), lidocaine-prilocaine, loperamide HCl, ondansetron (ZOFRAN) IV, opium-belladonna, pentafluoroprop-tetrafluoroeth, promethazine, Resource ThickenUp Clear, sodium chloride, sodium chloride flush, sodium chloride flush    Assessment/Plan   1. Acute systolic HF -> Cardiogenic shock - post-op echo on 03/20/19 EF 25-30% (pre-op 30-35%. In 12/2017 EF normal) - Required Impella support post CABG. Impella removed 2/23. Post-op TEE EF 40% - Has developed ESRD. Volume controlled through HD - Weight up a few pounds w/ mild edema. Plan HD tomorrow - Continue iHD on MWF Schedule. Now off midodrine due to rash (unclear which medication caused). Can add back as needed - unable to tolerate GDMT with low BP and ESRD  2. CAD s/p CABG this admit (LIMA->LAD, SVG->OM, SVG->RCA on 03/15/19) - No s/s ischemia - Continue statin.  - Off ASA due to hematuria and BRBPR  3. Acute hypoxic respiratory failure with Pseudomonas PNA - s/p trach on 03/27/19 - completed meropenem for pseudomonas PNA - trach aspirate 3/5 w/ regrowth of Pseudomonas again.   - Completed antibiotic course.  - Trach decannulated 3/13. Stable on [place] - Stable no change   4. PAF - s/p MAZE and LAA occlusion - Remains in NSR. Off amio due to rash (unclear which agent it was) - Can reconsider AC in near future as bladder issues stabilize. Will d/w TCTS in am - no change for now  5. AKI -> ESRD - due to shock/ATN.  - Tunneled cath placed 3/9  - He is tolerating iHD. Midodrine now off.  - Vein mapping complete. Nephrology considering permanent access but no formal plans   6. Hematuria -Cystoscopy 3/15 with fulgeration - Back to OR 3/28 with fulgeration and formalin installation - No further  bleeding. Hgb stable 11.0  7. Complete heart block with profound symptomatic bradycardia - likely vagal in nature - s/p emergent TVP 3/25 - resolved TVP out.   8. Debility, severe - continue PT/OT - Hopefully to CIR soon.  9. F/E/N - Poor po intake.  - Continues w/ Cor-trak for TFs - Encourage po      10. Left Pleural Effusion  - moderate to large. - s/p PleurX on 3/8.  - TCTS managing. Draining M/Th  10. Diffuse rash - Appearance of drug induced rash. - Steroids started 3/21 -> much improved - Now tapering steroids. Decrease to prednisone 10 mg today - Can add needed meds back 1 by 1 watching for recurrent rash  11. Anemia: - Resolved -  hgb 9.0 -> 7.9 -> 10.9 -> 11.2->11.4->10.7 -> 11.0  12. Dysphagia and BRBPR - ? Possible esophagitis +/- radiation proctitis - GI has seen.Treated with IV fluconazole.  - Resolved  13. Hyperkalemia - resolved. K 4.0 today  - lokelma as needed on non HD days.  Suspect he should be ready for CIR soon.    Glori Bickers, MD  2:07 PM

## 2019-05-20 NOTE — Progress Notes (Addendum)
[address]       [place] [address]             [phone]      7 Days Post-Op Procedure(s) (LRB): CYSTOSCOPY CLOT EVACUATION FULGRATION CYSTOGRAM AND INSTILLATION OF FORMALIN (N/A) Subjective: Says appetite and food intake a little better yesterday  Objective: Vital signs in last 24 hours: Temp:  [97.6 F (36.4 C)-98.3 F (36.8 C)] 97.7 F (36.5 C) (04/04 0300) Pulse Rate:  [72-92] 76 (04/04 0300) Cardiac Rhythm: Normal sinus rhythm;Bundle branch block (04/04 0700) Resp:  [12-22] 12 (04/04 0300) BP: (83-130)/(48-68) 123/51 (04/04 0300) SpO2:  [92 %-96 %] 96 % (04/04 0300) Weight:  [85.2 kg-87.5 kg] 87.5 kg (04/04 0400)  Hemodynamic parameters for last 24 hours:    Intake/Output from previous day: 04/03 0701 - 04/04 0700 In: 45 [I.V.:10; NG/GT:30; IV Piggyback:5] Out: 185  Intake/Output this shift: No intake/output data recorded.  General appearance: alert, cooperative, fatigued and no distress Heart: regular rate and rhythm Lungs: dim mildly left base Abdomen: benign exam Extremities: min edema Wound: incis healing well  Lab Results: Recent Labs    05/19/19 0229 05/20/19 0257  WBC 6.8 7.1  HGB 10.9* 11.0*  HCT 35.4* 35.6*  PLT 92* 87*   BMET:  Recent Labs    05/19/19 0229 05/20/19 0257  NA 134* 135  K 4.6 4.0  CL 96* 99  CO2 26 27  GLUCOSE 117* 108*  BUN 160* 86*  CREATININE 5.56* 4.13*  CALCIUM 8.2* 8.0*    PT/INR: No results for input(s): LABPROT, INR in the last 72 hours. ABG    Component Value Date/Time   PHART 7.369 05/10/2019 1804   HCO3 20.0 05/10/2019 1804   TCO2 21 (L) 05/10/2019 1804   ACIDBASEDEF 5.0 (H) 05/10/2019 1804   O2SAT 99.0 05/10/2019 1804   CBG (last 3)  Recent Labs    05/19/19 1611 05/19/19 2107 05/20/19 0621  GLUCAP 101* 91 91    Meds Scheduled Meds: . sodium chloride   Intravenous Once  . aspirin  81 mg Oral Daily  . calcium acetate (Phos Binder)  667 mg Per Tube TID WC  .  chlorhexidine  15 mL Mouth Rinse BID  . Chlorhexidine Gluconate Cloth  6 each Topical Daily  . famotidine  10 mg Per Tube Daily  . feeding supplement (NEPRO CARB STEADY)  237 mL Per Tube QID  . feeding supplement (PRO-STAT SUGAR FREE 64)  60 mL Per Tube TID  . Gerhardt's butt cream   Topical BID  . hydrocortisone   Rectal BID  . hydrocortisone  25 mg Rectal BID  . insulin aspart  0-15 Units Subcutaneous TID WC  . insulin aspart  0-5 Units Subcutaneous QHS  . magic mouthwash  5 mL Oral TID  . mouth rinse  15 mL Mouth Rinse q12n4p  . metoCLOPramide  5 mg Oral TID AC  . multivitamin  1 tablet Per Tube QHS  . simethicone  80 mg Oral TID  . sodium chloride flush  10-40 mL Intracatheter Q12H   Continuous Infusions: . sodium chloride    . sodium chloride    . sodium chloride Stopped (05/08/19 1833)  . anticoagulant sodium citrate    . anticoagulant sodium citrate     PRN Meds:.sodium chloride, sodium chloride, oxyCODONE **AND** acetaminophen, dextrose, hydrOXYzine, hyoscyamine, levalbuterol, lidocaine (PF), lidocaine-prilocaine, loperamide HCl, ondansetron (ZOFRAN) IV, opium-belladonna, pentafluoroprop-tetrafluoroeth, promethazine, Resource ThickenUp Clear, sodium chloride, sodium chloride flush, sodium chloride  flush  Xrays DG Chest Port 1 View  Result Date: 05/19/2019 CLINICAL DATA:  Atrial fibrillation with rapid ventricular response. History of CABG. EXAM: PORTABLE CHEST 1 VIEW COMPARISON:  Chest x-rays dated 05/14/2019 and 05/13/2019. FINDINGS: Support apparatus appears stable in position. Heart size and mediastinal contours are stable. Probable mild atelectasis and/or small pleural effusion at the LEFT lung base, but improved compared to the previous study. No pneumothorax is seen. IMPRESSION: 1. Improved aeration at both lung bases. Persistent mild atelectasis and/or small pleural effusion at the LEFT lung base, but improved. 2. Support apparatus appears stable in position. Electronically  Signed   By: Franki Cabot M.D.   On: 05/19/2019 07:57    Assessment/Plan: S/P Procedure(s) (LRB): CYSTOSCOPY CLOT EVACUATION FULGRATION CYSTOGRAM AND INSTILLATION OF FORMALIN (N/A) 1 conts to make steady overall progress 2 cont to encourage food intake so we can avoid TF's  3 hemodyn stable in sinus rhythm 4 sats good on 1-2 liters, cont pulm toilet/IS 5 BS adeq controlled on SSI 6 renal issues/HD as per nephrology 7 platelets a little lower today but relatively stable , HIT neg but would be good to avoid heparin if able for multiple reasons with previous melena/hematuria issues  LOS: 67 days    John Giovanni PA-C Pager [phone] 05/20/2019 Patient seen and examined, agree with above Po intake still pretty marginal  Remo Lipps C. Roxan Hockey, MD [facility] [phone]

## 2019-05-20 NOTE — Plan of Care (Signed)
Problem: Education: Goal: Knowledge of General Education information will improve Description: Including pain rating scale, medication(s)/side effects and non-pharmacologic comfort measures Outcome: Progressing   Problem: Health Behavior/Discharge Planning: Goal: Ability to manage health-related needs will improve Outcome: Progressing   Problem: Clinical Measurements: Goal: Ability to maintain clinical measurements within normal limits will improve Outcome: Progressing Goal: Will remain free from infection Outcome: Progressing Goal: Diagnostic test results will improve Outcome: Progressing Goal: Respiratory complications will improve Outcome: Progressing Goal: Cardiovascular complication will be avoided Outcome: Progressing   Problem: Activity: Goal: Risk for activity intolerance will decrease Outcome: Progressing   Problem: Nutrition: Goal: Adequate nutrition will be maintained Outcome: Progressing   Problem: Coping: Goal: Level of anxiety will decrease Outcome: Progressing   Problem: Elimination: Goal: Will not experience complications related to urinary retention Outcome: Progressing   Problem: Pain Managment: Goal: General experience of comfort will improve Outcome: Progressing   Problem: Safety: Goal: Ability to remain free from injury will improve Outcome: Progressing   Problem: Skin Integrity: Goal: Risk for impaired skin integrity will decrease Outcome: Progressing   Problem: Education: Goal: Knowledge of disease or condition will improve Outcome: Progressing Goal: Understanding of medication regimen will improve Outcome: Progressing Goal: Individualized Educational Video(s) Outcome: Progressing   Problem: Activity: Goal: Ability to tolerate increased activity will improve Outcome: Progressing   Problem: Cardiac: Goal: Ability to achieve and maintain adequate cardiopulmonary perfusion will improve Outcome: Progressing   Problem: Health  Behavior/Discharge Planning: Goal: Ability to safely manage health-related needs after discharge will improve Outcome: Progressing   Problem: Education: Goal: Understanding of CV disease, CV risk reduction, and recovery process will improve Outcome: Progressing Goal: Individualized Educational Video(s) Outcome: Progressing   Problem: Activity: Goal: Ability to return to baseline activity level will improve Outcome: Progressing   Problem: Cardiovascular: Goal: Ability to achieve and maintain adequate cardiovascular perfusion will improve Outcome: Progressing Goal: Vascular access site(s) Level 0-1 will be maintained Outcome: Progressing   Problem: Health Behavior/Discharge Planning: Goal: Ability to safely manage health-related needs after discharge will improve Outcome: Progressing   Problem: Education: Goal: Knowledge of disease and its progression will improve Outcome: Progressing   Problem: Health Behavior/Discharge Planning: Goal: Ability to manage health-related needs will improve Outcome: Progressing   Problem: Clinical Measurements: Goal: Complications related to the disease process or treatment will be avoided or minimized Outcome: Progressing Goal: Dialysis access will remain free of complications Outcome: Progressing   Problem: Activity: Goal: Activity intolerance will improve Outcome: Progressing   Problem: Fluid Volume: Goal: Fluid volume balance will be maintained or improved Outcome: Progressing   Problem: Nutritional: Goal: Ability to make appropriate dietary choices will improve Outcome: Progressing   Problem: Respiratory: Goal: Respiratory symptoms related to disease process will be avoided Outcome: Progressing   Problem: Self-Concept: Goal: Body image disturbance will be avoided or minimized Outcome: Progressing   Problem: Urinary Elimination: Goal: Progression of disease will be identified and treated Outcome: Progressing   Problem:  Activity: Goal: Risk for activity intolerance will decrease Outcome: Progressing   Problem: Cardiac: Goal: Will achieve and/or maintain hemodynamic stability Outcome: Progressing   Problem: Clinical Measurements: Goal: Postoperative complications will be avoided or minimized Outcome: Progressing   Problem: Respiratory: Goal: Respiratory status will improve Outcome: Progressing   Problem: Skin Integrity: Goal: Wound healing without signs and symptoms of infection Outcome: Progressing Goal: Risk for impaired skin integrity will decrease Outcome: Progressing   Problem: Urinary Elimination: Goal: Ability to achieve and maintain adequate renal perfusion and functioning will improve  Outcome: Progressing   

## 2019-05-21 LAB — RENAL FUNCTION PANEL
Albumin: 2 g/dL — ABNORMAL LOW (ref 3.5–5.0)
Anion gap: 12 (ref 5–15)
BUN: 114 mg/dL — ABNORMAL HIGH (ref 8–23)
CO2: 25 mmol/L (ref 22–32)
Calcium: 7.8 mg/dL — ABNORMAL LOW (ref 8.9–10.3)
Chloride: 95 mmol/L — ABNORMAL LOW (ref 98–111)
Creatinine, Ser: 5.37 mg/dL — ABNORMAL HIGH (ref 0.61–1.24)
GFR calc Af Amer: 10 mL/min — ABNORMAL LOW (ref >60)
GFR calc non Af Amer: 9 mL/min — ABNORMAL LOW (ref >60)
Glucose, Bld: 109 mg/dL — ABNORMAL HIGH (ref 70–99)
Phosphorus: 5.7 mg/dL — ABNORMAL HIGH (ref 2.5–4.6)
Potassium: 4.4 mmol/L (ref 3.5–5.1)
Sodium: 132 mmol/L — ABNORMAL LOW (ref 135–145)

## 2019-05-21 LAB — GLUCOSE, CAPILLARY
Glucose-Capillary: 97 mg/dL (ref 70–99)
Glucose-Capillary: 80 mg/dL (ref 70–99)
Glucose-Capillary: 88 mg/dL (ref 70–99)
Glucose-Capillary: 119 mg/dL — ABNORMAL HIGH (ref 70–99)

## 2019-05-21 LAB — CBC
HCT: 35.1 % — ABNORMAL LOW (ref 39.0–52.0)
Hemoglobin: 11 g/dL — ABNORMAL LOW (ref 13.0–17.0)
MCH: 28.9 pg (ref 26.0–34.0)
MCHC: 31.3 g/dL (ref 30.0–36.0)
MCV: 92.4 fL (ref 80.0–100.0)
Platelets: 91 10*3/uL — ABNORMAL LOW (ref 150–400)
RBC: 3.8 MIL/uL — ABNORMAL LOW (ref 4.22–5.81)
RDW: 18.7 % — ABNORMAL HIGH (ref 11.5–15.5)
WBC: 7.7 10*3/uL (ref 4.0–10.5)
nRBC: 0 % (ref 0.0–0.2)

## 2019-05-21 LAB — MAGNESIUM: Magnesium: 2.2 mg/dL (ref 1.7–2.4)

## 2019-05-21 MED ORDER — HYOSCYAMINE SULFATE 0.125 MG SL SUBL: 0.1250 mg | SUBLINGUAL_TABLET | Freq: Four times a day (QID) | SUBLINGUAL | 0 refills | Status: DC | PRN

## 2019-05-21 MED ORDER — HYDROCORTISONE (PERIANAL) 2.5 % EX CREA: TOPICAL_CREAM | Freq: Two times a day (BID) | CUTANEOUS | 0 refills | Status: DC

## 2019-05-21 MED ORDER — ASPIRIN 81 MG PO CHEW: 81.0000 mg | CHEWABLE_TABLET | Freq: Every day | ORAL | Status: DC

## 2019-05-21 MED ORDER — HYDROXYZINE HCL 10 MG/5ML PO SYRP: 10.0000 mg | ORAL_SOLUTION | Freq: Four times a day (QID) | ORAL | 0 refills | Status: DC | PRN

## 2019-05-21 MED ORDER — ANTICOAGULANT SODIUM CITRATE 4% (200MG/5ML) IV SOLN: 5.0000 mL | Status: DC

## 2019-05-21 MED ORDER — MAGIC MOUTHWASH: 5.0000 mL | Freq: Three times a day (TID) | ORAL | 0 refills | Status: DC

## 2019-05-21 MED ORDER — LOPERAMIDE HCL 1 MG/7.5ML PO SUSP: 4.0000 mg | ORAL | 0 refills | Status: DC | PRN

## 2019-05-21 MED ORDER — NEPRO/CARBSTEADY PO LIQD
1000.0000 mL | ORAL | Status: DC
  Administered 2019-05-21: 1000 mL via ORAL
  Filled 2019-05-21 (×2): qty 1000

## 2019-05-21 MED ORDER — ACETAMINOPHEN 160 MG/5ML PO SOLN: 325.0000 mg | ORAL | 0 refills | Status: DC | PRN

## 2019-05-21 MED ORDER — LIDOCAINE HCL (PF) 1 % IJ SOLN: 5.0000 mL | INTRAMUSCULAR | 0 refills | Status: DC | PRN

## 2019-05-21 MED ORDER — OXYCODONE HCL 5 MG/5ML PO SOLN: 5.0000 mg | Freq: Four times a day (QID) | ORAL | 0 refills | Status: DC | PRN

## 2019-05-21 MED ORDER — RESOURCE THICKENUP CLEAR PO POWD: 1.0000 | ORAL | Status: DC | PRN

## 2019-05-21 MED ORDER — CHLORHEXIDINE GLUCONATE CLOTH 2 % EX PADS
6.0000 | MEDICATED_PAD | Freq: Every day | CUTANEOUS | Status: DC
  Administered 2019-05-21 – 2019-05-22 (×2): 6 via TOPICAL

## 2019-05-21 MED ORDER — ORAL CARE MOUTH RINSE: 15.0000 mL | Freq: Two times a day (BID) | OROMUCOSAL | 0 refills | Status: DC

## 2019-05-21 MED ORDER — METOCLOPRAMIDE HCL 5 MG PO TABS: 5.0000 mg | ORAL_TABLET | Freq: Three times a day (TID) | ORAL | Status: DC

## 2019-05-21 MED ORDER — DIPHENHYDRAMINE HCL 25 MG PO CAPS: 25.0000 mg | ORAL_CAPSULE | Freq: Four times a day (QID) | ORAL | 0 refills | Status: DC | PRN

## 2019-05-21 MED ORDER — HYDROCORTISONE ACETATE 25 MG RE SUPP: 25.0000 mg | Freq: Two times a day (BID) | RECTAL | 0 refills | Status: DC

## 2019-05-21 MED ORDER — HYDROCORTISONE 1 % EX CREA: TOPICAL_CREAM | Freq: Two times a day (BID) | CUTANEOUS | 0 refills | Status: DC

## 2019-05-21 MED ORDER — GERHARDT'S BUTT CREAM: 1.0000 "application " | TOPICAL_CREAM | Freq: Two times a day (BID) | CUTANEOUS | Status: DC

## 2019-05-21 MED ORDER — FAMOTIDINE 10 MG PO TABS: 10.0000 mg | ORAL_TABLET | Freq: Every day | ORAL | Status: DC

## 2019-05-21 MED ORDER — SIMETHICONE 80 MG PO CHEW: 80.0000 mg | CHEWABLE_TABLET | Freq: Three times a day (TID) | ORAL | 0 refills | Status: DC

## 2019-05-21 MED ORDER — ATORVASTATIN CALCIUM 10 MG PO TABS: 10.0000 mg | ORAL_TABLET | Freq: Every day | ORAL | Status: DC

## 2019-05-21 MED ORDER — ONDANSETRON HCL 4 MG/2ML IJ SOLN: 4.0000 mg | Freq: Four times a day (QID) | INTRAMUSCULAR | 0 refills | Status: DC | PRN

## 2019-05-21 MED ORDER — BELLADONNA ALKALOIDS-OPIUM 16.2-60 MG RE SUPP: 1.0000 | Freq: Three times a day (TID) | RECTAL | 0 refills | Status: DC | PRN

## 2019-05-21 MED ORDER — LIDOCAINE-PRILOCAINE 2.5-2.5 % EX CREA: 1.0000 "application " | TOPICAL_CREAM | CUTANEOUS | 0 refills | Status: DC | PRN

## 2019-05-21 MED ORDER — NEPRO/CARBSTEADY PO LIQD: 237.0000 mL | Freq: Four times a day (QID) | ORAL | 0 refills | Status: DC

## 2019-05-21 MED ORDER — CHLORHEXIDINE GLUCONATE 0.12 % MT SOLN: 15.0000 mL | Freq: Two times a day (BID) | OROMUCOSAL | 0 refills | Status: DC

## 2019-05-21 MED ORDER — INSULIN ASPART 100 UNIT/ML ~~LOC~~ SOLN: 0.0000 [IU] | Freq: Three times a day (TID) | SUBCUTANEOUS | 11 refills | Status: DC

## 2019-05-21 MED ORDER — LEVALBUTEROL HCL 0.63 MG/3ML IN NEBU: 0.6300 mg | INHALATION_SOLUTION | Freq: Four times a day (QID) | RESPIRATORY_TRACT | 12 refills | Status: DC | PRN

## 2019-05-21 MED ORDER — PRO-STAT SUGAR FREE PO LIQD: 60.0000 mL | Freq: Three times a day (TID) | ORAL | 0 refills | Status: DC

## 2019-05-21 MED ORDER — CALCIUM ACETATE (PHOS BINDER) 667 MG/5ML PO SOLN: 667.0000 mg | Freq: Three times a day (TID) | ORAL | Status: DC

## 2019-05-21 MED ORDER — INSULIN ASPART 100 UNIT/ML ~~LOC~~ SOLN: 0.0000 [IU] | Freq: Every day | SUBCUTANEOUS | 11 refills | Status: DC

## 2019-05-21 MED ORDER — RENA-VITE PO TABS: 1.0000 | ORAL_TABLET | Freq: Every day | ORAL | 0 refills | Status: DC

## 2019-05-21 NOTE — Progress Notes (Signed)
[facility] NEPHROLOGY PROGRESS NOTE  Assessment/ Plan: Pt is a 86 y.o. yo male with history of CHF, cardiogenic shock requiring Impella support, status post CABG, A. fib, hemorrhagic cystitis followed by urologist, consulted for AKI now dialysis dependent.  #Acute kidney injury likely ATN due to cardiogenic shock/CABG surgery: Now dialysis dependent.  No sign of recovery so far.  Plan for next dialysis tomorrow.  Volume status acceptable.  Concern for HIT however the test result was negative.  We will try to avoid heparin.  Needs outpatient HD arrangement for AKI.  #Acute systolic heart failure/cardiogenic shock/status post Impella support which was removed.  Tolerating dialysis so far.  # Anemia due to critical illness/acute blood loss: Hb at goal.  # Secondary hyperparathyroidism: PTH 116.  Monitor phosphorus level.  # HTN/volume: BP and volume status looks acceptable.  UF during dialysis.  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  #Hyperkalemia: Resolved.  Subjective: Seen and examined at bedside.  Chart reviewed.  Denies nausea vomiting chest pain shortness of breath.  No new event. Objective Vital signs in last 24 hours: Vitals:   05/21/19 0018 05/21/19 0400 05/21/19 0446 05/21/19 0500  BP: (!) 128/47  (!) 125/47   Pulse: 74  71   Resp: 15 14 13    Temp: 97.9 F (36.6 C)  98 F (36.7 C)   TempSrc: Oral  Oral   SpO2: 96% 96% 97%   Weight:    86.5 kg  Height:       Weight change: 0.9 kg  Intake/Output Summary (Last 24 hours) at 05/21/2019 0710 Last data filed at 05/20/2019 0900 Gross per 24 hour  Intake 50 ml  Output --  Net 50 ml       Labs: Basic Metabolic Panel: Recent Labs  Lab 05/19/19 0229 05/20/19 0257 05/21/19 0135  NA 134* 135 132*  K 4.6 4.0 4.4  CL 96* 99 95*  CO2 26 27 25   GLUCOSE 117* 108* 109*  BUN 160* 86* 114*  CREATININE 5.56* 4.13* 5.37*  CALCIUM 8.2* 8.0* 7.8*  PHOS 6.3* 4.5 5.7*    Liver Function Tests: Recent Labs  Lab 05/19/19 0229 05/20/19 0257 05/21/19 0135  ALBUMIN 2.1* 2.0* 2.0*   No results for input(s): LIPASE, AMYLASE in the last 168 hours. No results for input(s): AMMONIA in the last 168 hours. CBC: Recent Labs  Lab 05/17/19 0618 05/17/19 0618 05/18/19 0235 05/18/19 0235 05/19/19 0229 05/20/19 0257 05/21/19 0135  WBC 6.6   < > 6.8   < > 6.8 7.1 7.7  HGB 11.4*   < > 10.7*   < > 10.9* 11.0* 11.0*  HCT 37.4*   < > 34.4*   < > 35.4* 35.6* 35.1*  MCV 93.3  --  92.7  --  93.7 92.7 92.4  PLT 77*   < > 84*   < > 92* 87* 91*   < > = values in this interval not displayed.   Cardiac Enzymes: No results for input(s): CKTOTAL, CKMB, CKMBINDEX, TROPONINI in the last 168 hours. CBG: Recent Labs  Lab 05/20/19 0621 05/20/19 1142 05/20/19 1641 05/20/19 2105 05/21/19 0637  GLUCAP 91 84 96 124* 97    Iron Studies: No results for input(s): IRON, TIBC, TRANSFERRIN, FERRITIN in the last 72 hours. Studies/Results: No results found.  Medications: Infusions: . sodium chloride    . sodium chloride    . sodium chloride Stopped (05/08/19 1833)  . anticoagulant sodium citrate    . anticoagulant  sodium citrate      Scheduled Medications: . sodium chloride   Intravenous Once  . aspirin  81 mg Oral Daily  . calcium acetate (Phos Binder)  667 mg Per Tube TID WC  . chlorhexidine  15 mL Mouth Rinse BID  . Chlorhexidine Gluconate Cloth  6 each Topical Daily  . famotidine  10 mg Per Tube Daily  . feeding supplement (NEPRO CARB STEADY)  237 mL Per Tube QID  . feeding supplement (PRO-STAT SUGAR FREE 64)  60 mL Per Tube TID  . Gerhardt's butt cream   Topical BID  . hydrocortisone   Rectal BID  . hydrocortisone  25 mg Rectal BID  . hydrocortisone cream   Topical BID  . insulin aspart  0-15 Units Subcutaneous TID WC  . insulin aspart  0-5 Units Subcutaneous QHS  . magic mouthwash  5 mL Oral TID  . mouth rinse  15 mL Mouth Rinse q12n4p  . metoCLOPramide   5 mg Oral TID AC  . multivitamin  1 tablet Per Tube QHS  . simethicone  80 mg Oral TID  . sodium chloride flush  10-40 mL Intracatheter Q12H    have reviewed scheduled and prn medications.  Physical Exam: General:NAD, comfortable Heart:RRR, s1s2 nl Lungs:clear b/l, no crackle Abdomen:soft, Non-tender, non-distended Extremities:No edema Dialysis Access: L IJ TDC for the access.  Denelda Akerley Prasad Vickey Ewbank 05/21/2019,7:10 AM  LOS: 68 days  Pager: [phone]

## 2019-05-21 NOTE — Progress Notes (Addendum)
      [address]       [place] [address]             [phone]        8 Days Post-Op Procedure(s) (LRB): CYSTOSCOPY CLOT EVACUATION FULGRATION CYSTOGRAM AND INSTILLATION OF FORMALIN (N/A)  Subjective: Patient states still difficult to swallow  Objective: Vital signs in last 24 hours: Temp:  [97.8 F (36.6 C)-98.4 F (36.9 C)] 98 F (36.7 C) (04/05 0446) Pulse Rate:  [71-76] 71 (04/05 0446) Cardiac Rhythm: Normal sinus rhythm;Bundle branch block (04/05 0446) Resp:  [13-16] 13 (04/05 0446) BP: (119-128)/(46-72) 125/47 (04/05 0446) SpO2:  [96 %-100 %] 97 % (04/05 0446) Weight:  [86.5 kg] 86.5 kg (04/05 0500)   Current Weight  05/21/19 86.5 kg       Intake/Output from previous day: 04/04 0701 - 04/05 0700 In: 50 [P.O.:50] Out: -    Physical Exam:  Cardiovascular: RRR Pulmonary: Clear to auscultation on the right and slightly diminished left base Abdomen: Soft, non tender, bowel sounds present. Extremities: Mild bilateral lower extremity edema. Wounds: Clean and dry.  No erythema or signs of infection.  Lab Results: CBC: Recent Labs    05/20/19 0257 05/21/19 0135  WBC 7.1 7.7  HGB 11.0* 11.0*  HCT 35.6* 35.1*  PLT 87* 91*   BMET:  Recent Labs    05/20/19 0257 05/21/19 0135  NA 135 132*  K 4.0 4.4  CL 99 95*  CO2 27 25  GLUCOSE 108* 109*  BUN 86* 114*  CREATININE 4.13* 5.37*  CALCIUM 8.0* 7.8*    PT/INR:  Lab Results  Component Value Date   INR 1.7 (H) 03/27/2019   INR 1.6 (H) 03/16/2019   INR 1.2 03/15/2019   ABG:  INR: Will add last result for INR, ABG once components are confirmed Will add last 4 CBG results once components are confirmed  Assessment/Plan:  1. CV - S/p MAZE and LA clip. Previous PAF, CHB with significant bradycardia.  SR with HR in the 70's. Not on ec asa secondary to hematuria and BRBPR 2.  Pulmonary - On 1 liter of oxygen via [place]. Left pleural effusion;s/p left Pleur X on 03/08. Currently,  being drained Mon and Thursday 3.  DM-CBGs 96/124/97. On Insulin. Pre op HGA1C 6.1 4.  Anemia due to critical illness- H and H this am stable at 11 and 35.1 5. AKI progressed to ESRD-creatinine this am 5.37. Nephrology following and arranging for HD (M/W/Fri). He will likely need more permanent access at some point 6. Thrombocytopenia-platelets this am slightly increased to 91,000.HIT neg but would be best to avoid heparin if able for multiple reasons with previous melena/hematuria issues 7. Acute systolic heart failure-volume status controlled by HD 8. Severely deconditioned-continue PT/OT 9. GI-poor oral intake. Has [name]. On Dysphagia II diet. 10. Hyperlipidemia-as discussed with Dr. Prescott Gum, will restart low dose Atorvastatin 11. Hopefully, to [place] 05/21/2019,7:02 AM  patient not tolerating bolus feeds due to nausea- resume   Nepro cont feeding for a couple days RFash worse- bladder meds Dced HD planned per renal w/o heparin Ok for CIR  patient examined and medical record reviewed,agree with above note. Tharon Aquas Trigt III 05/21/2019

## 2019-05-21 NOTE — Progress Notes (Signed)
Inpatient Rehab Admissions Coordinator:   Followed up with pt at bedside after he finished with OT.  Unfortunately, I have no beds for him today; suspect I will be able to admit him to CIR tomorrow, pending clearance from his acute medical team.    Shann Medal, PT, DPT Admissions Coordinator [phone] 05/21/19  11:20 AM

## 2019-05-21 NOTE — Discharge Instructions (Signed)
Discharge Instructions:  1. You may shower, please wash incisions daily with soap and water and keep dry.  If you wish to cover wounds with dressing you may do so but please keep clean and change daily.  No tub baths or swimming until incisions have completely healed.  If your incisions become red or develop any drainage please call our office at [phone]  2. Monitor your weight daily.. Please use the same scale and weigh at same time... If you gain 5-10 lbs in 48 hours with associated lower extremity swelling, please contact our office at [phone]  3. Fever of 101.5 for at least 24 hours with no source, please contact our office at [phone]  4. Activity- up as tolerated, please walk at least 3 times per day.  Avoid strenuous activity, no lifting, pushing, or pulling with your arms over 8-10 lbs for a minimum of 6 weeks  5. If any questions or concerns arise, please do not hesitate to contact our office at [phone]

## 2019-05-21 NOTE — Progress Notes (Signed)
Patient sat in chair majority of day, tolerated well, will need 2 person assist to move from bed to chair---stated he was very nauseated, I have given Zofran IV, patient refused all PO pills due to nausea, IV continuous feed has been tolerated well so far

## 2019-05-21 NOTE — Progress Notes (Addendum)
Patient ID: Jesse Murphy, male   DOB: February 07, 1929, 86 y.o.   MRN: [id number] f    Advanced Heart Failure Rounding Note  PCP-Cardiologist: Kate Sable, MD   Subjective:    Tolerating iHD. Minimal urine production. Continues w/ hematuria. Urine in purewick canister w/ gross hematuria. Hgb stable at 11.   New diffuse erythematous rash on face. Developed overnight. Extremities and abdomen/torso clear. Non pruritic. ? Contact dermatitis as he did apply new facial moisturizer to face yesterday.  Appetite remains poor. Continues w/ TFs. Got nauseated this am after meds were given. Emesis x 1.   Laying in bed. States he feels "ok". No pain or dyspnea. VSS. Not interested in eating.    Objective:   Weight Range: 86.5 kg Body mass index is 29 kg/m.   Vital Signs:   Temp:  [97.7 F (36.5 C)-98.4 F (36.9 C)] (P) 97.7 F (36.5 C) (04/05 0756) Pulse Rate:  [71-74] 71 (04/05 0446) Resp:  [13-16] 13 (04/05 0446) BP: (119-128)/(46-72) (P) 129/49 (04/05 0756) SpO2:  [96 %-100 %] 97 % (04/05 0446) Weight:  [86.5 kg] 86.5 kg (04/05 0500) Last BM Date: 05/20/19  Weight change: Filed Weights   05/19/19 1507 05/20/19 0400 05/21/19 0500  Weight: 85.2 kg 87.5 kg 86.5 kg    Intake/Output:   Intake/Output Summary (Last 24 hours) at 05/21/2019 0942 Last data filed at 05/21/2019 3299 Gross per 24 hour  Intake --  Output 200 ml  Net -200 ml     Physical Exam   General:  elderly WM, Lying in bed. No resp difficulty, + diffuse erythematous rash on face  HEENT: normal + NGT  Neck: supple. no JVD. Carotids 2+ bilat; no bruits. No lymphadenopathy or thryomegaly appreciated. Cor: PMI nondisplaced. Regular rate & rhythm. No rubs, gallops or murmurs. + HD cath Lungs: clear Abdomen: soft, nontender, nondistended. No hepatosplenomegaly. No bruits or masses. Good bowel sounds. Extremities: no cyanosis, clubbing, rash, trace bilateral LE edema Neuro: alert & orientedx3, cranial nerves grossly  intact. moves all 4 extremities w/o difficulty. Affect pleasant   Telemetry   Sinus 70-80s Personally reviewed  Labs    CBC Recent Labs    05/20/19 0257 05/21/19 0135  WBC 7.1 7.7  HGB 11.0* 11.0*  HCT 35.6* 35.1*  MCV 92.7 92.4  PLT 87* 91*   Basic Metabolic Panel Recent Labs    05/20/19 0257 05/21/19 0135  NA 135 132*  K 4.0 4.4  CL 99 95*  CO2 27 25  GLUCOSE 108* 109*  BUN 86* 114*  CREATININE 4.13* 5.37*  CALCIUM 8.0* 7.8*  MG 2.1 2.2  PHOS 4.5 5.7*   Liver Function Tests Recent Labs    05/20/19 0257 05/21/19 0135  ALBUMIN 2.0* 2.0*   No results for input(s): LIPASE, AMYLASE in the last 72 hours. Cardiac Enzymes No results for input(s): CKTOTAL, CKMB, CKMBINDEX, TROPONINI in the last 72 hours.  BNP: BNP (last 3 results) Recent Labs    03/22/19 0401 03/26/19 0626 03/27/19 1255  BNP 340.4* 687.5* 800.4*    ProBNP (last 3 results) No results for input(s): PROBNP in the last 8760 hours.   D-Dimer No results for input(s): DDIMER in the last 72 hours. Hemoglobin A1C No results for input(s): HGBA1C in the last 72 hours. Fasting Lipid Panel No results for input(s): CHOL, HDL, LDLCALC, TRIG, CHOLHDL, LDLDIRECT in the last 72 hours. Thyroid Function Tests No results for input(s): TSH, T4TOTAL, T3FREE, THYROIDAB in the last 72 hours.  Invalid input(s): FREET3  Other results:   Imaging    No results found.   Medications:     Scheduled Medications:  sodium chloride   Intravenous Once   aspirin  81 mg Oral Daily   atorvastatin  10 mg Oral q1800   calcium acetate (Phos Binder)  667 mg Per Tube TID WC   chlorhexidine  15 mL Mouth Rinse BID   Chlorhexidine Gluconate Cloth  6 each Topical Daily   Chlorhexidine Gluconate Cloth  6 each Topical Q0600   famotidine  10 mg Per Tube Daily   feeding supplement (PRO-STAT SUGAR FREE 64)  60 mL Per Tube TID   Gerhardt's butt cream   Topical BID   hydrocortisone   Rectal BID   hydrocortisone  25  mg Rectal BID   hydrocortisone cream   Topical BID   insulin aspart  0-15 Units Subcutaneous TID WC   insulin aspart  0-5 Units Subcutaneous QHS   magic mouthwash  5 mL Oral TID   mouth rinse  15 mL Mouth Rinse q12n4p   metoCLOPramide  5 mg Oral TID AC   multivitamin  1 tablet Per Tube QHS   simethicone  80 mg Oral TID   sodium chloride flush  10-40 mL Intracatheter Q12H    Infusions:  sodium chloride     sodium chloride     sodium chloride Stopped (05/08/19 1833)   anticoagulant sodium citrate     anticoagulant sodium citrate     feeding supplement (NEPRO CARB STEADY)      PRN Medications: sodium chloride, sodium chloride, oxyCODONE **AND** acetaminophen, dextrose, diphenhydrAMINE, hydrOXYzine, lidocaine (PF), lidocaine-prilocaine, loperamide HCl, ondansetron (ZOFRAN) IV, pentafluoroprop-tetrafluoroeth, promethazine, Resource ThickenUp Clear, sodium chloride, sodium chloride flush, sodium chloride flush    Assessment/Plan   1. Acute systolic HF -> Cardiogenic shock - post-op echo on 03/20/19 EF 25-30% (pre-op 30-35%. In 12/2017 EF normal) - Required Impella support post CABG. Impella removed 2/23. Post-op TEE EF 40% - Has developed ESRD. Volume controlled through HD - Volume stable. Continue HD per nephrology  - Continue iHD on TThSat Schedule. Now off midodrine due to rash (unclear which medication caused).  - unable to tolerate GDMT with low BP and ESRD  2. CAD s/p CABG this admit (LIMA->LAD, SVG->OM, SVG->RCA on 03/15/19) - No s/s ischemia - Continue statin.  - Off ASA due to hematuria and BRBPR   3. Acute hypoxic respiratory failure with Pseudomonas PNA - s/p trach on 03/27/19 - completed meropenem for pseudomonas PNA - trach aspirate 3/5 w/ regrowth of Pseudomonas again.   - Completed antibiotic course.  - Trach decannulated 3/13. Stable on [place] - Stable no change   4. PAF - s/p MAZE and LAA occlusion - Remains in NSR. Off amio due to rash (unclear which agent it  was) - Can reconsider AC in near future as bladder issues stabilize.  - no change for now  5. AKI -> ESRD - due to shock/ATN.  - Tunneled cath placed 3/9  - He is tolerating iHD. Midodrine now off.  - Vein mapping complete. Nephrology considering permanent access but no formal plans   6. Hematuria -Cystoscopy 3/15 with fulgeration - Back to OR 3/28 with fulgeration and formalin installation - Continues to have evidence of hematuria. However Hgb stable at 11.0 - Monitor   7. Complete heart block with profound symptomatic bradycardia - likely vagal in nature - s/p emergent TVP 3/25 - resolved TVP out.   8. Debility, severe - continue PT/OT - Hopefully to  CIR soon.  9. F/E/N - Poor po intake.  - Continues w/ Cor-trak for TFs - Encourage po      10. Left Pleural Effusion  - moderate to large. - s/p PleurX on 3/8.  - TCTS managing. Draining M/Th  10. Rash - Initially w/ diffuse rash on extremities and torso w/ appearance of drug induced rash. - Steroids started 3/21 w/ improvement/ resolution. Now off prednisone - Now w/ new rash isolated to face (developed 4/4), most likely contact dermatis after use of new facial moisturizer. Pt and RN instructed to refrain from further use. Continue to monitor   11. Anemia: - Resolved - hgb 9.0 -> 7.9 -> 10.9 -> 11.2->11.4->10.7 -> 11.0  12. Dysphagia and BRBPR - ? Possible esophagitis +/- radiation proctitis - GI has seen.Treated with IV fluconazole.  - Resolved  13. Hyperkalemia - resolved. K 4.4 today  - lokelma as needed on non HD days.  Suspect he should be ready for CIR soon.    Lyda Jester, PA-C  9:42 AM   Patient seen and examined with the above-signed Advanced Practice Provider and/or Housestaff. I personally reviewed laboratory data, imaging studies and relevant notes. I independently examined the patient and formulated the important aspects of the plan. I have edited the note to reflect any of my changes or  salient points. I have personally discussed the plan with the patient and/or family.  See my note from the same day for other details.   Glori Bickers, MD  7:08 PM

## 2019-05-21 NOTE — Progress Notes (Signed)
Occupational Therapy Treatment Patient Details Name: Jesse Murphy MRN: [id number] DOB: [birth year] Today's Date: 05/21/2019    History of present illness 86 y.o. male with PMH: h/o CAD s/p CABG and clipping of left atrial appendage, who presented with SOB on 2/1. He became hypoxic on 2/2 with pulmonary edema and needed to be intubated on 2/2 and extubated 2/5. Pt underwent impella placement and tracheostomy on 2/9. Pt started on CRRT 2/11. Impella removal and tracheostomy 2/23. CRRT restarted 3/1. Pt transitioned to intermittent HD. Pt on continuous bladder irrigation.s/p cystoscopy with evacuation of clot and fulgoration of bladder neck bleeders 3/15. Pt with bradycardia and with temporary pacer placed 3/26 and removed 3/27. Pt with Cystoscopy with clot evacuation and fulguration on 3/28.    OT comments  Pt currently dizzy with mobility and change of positioning. Pt's BP 118/59 in sitting. Pt transferring to recliner with minguardA and use of RW. Pt rocking forward 3 times prior to standing. Set-upA for light grooming and energy conservation techniques reviewed. Pt revealing 1 technique to use at home. Pt would benefit from continued OT skilled services. OT following acutely.    Follow Up Recommendations  CIR;Supervision/Assistance - 24 hour    Equipment Recommendations  Other (comment)(TBD)    Recommendations for Other Services      Precautions / Restrictions Precautions Precautions: Fall;Other (comment) Precaution Booklet Issued: No Precaution Comments: slightly orthostatic Restrictions Weight Bearing Restrictions: Yes RUE Partial Weight Bearing Percentage or Pounds: Can place ~20lbs through his UEs for mobility per MD Other Position/Activity Restrictions: sternal precautions       Mobility Bed Mobility Overal bed mobility: Needs Assistance Bed Mobility: Supine to Sit Rolling: Min assist   Supine to sit: Min assist Sit to supine: Min assist   General bed mobility comments:  UE support for trunk elevation and scooting hips to EOB  Transfers Overall transfer level: Needs assistance Equipment used: Rolling walker (2 wheeled) Transfers: Sit to/from Stand Sit to Stand: Min guard         General transfer comment: Pt using RUE on bed to perform sit to stand; LUE on RW- not using for pulling    Balance Overall balance assessment: Needs assistance   Sitting balance-Leahy Scale: Good       Standing balance-Leahy Scale: Poor Standing balance comment: Reliant on UE support and external assist                           ADL either performed or assessed with clinical judgement   ADL Overall ADL's : Needs assistance/impaired     Grooming: Set up;Sitting                               Functional mobility during ADLs: Minimal assistance;+2 for physical assistance;Rolling walker General ADL Comments: Pt stand pivotting to recliner today. Set-upA for light grooming, but pt denied having energy for task.     Vision   Vision Assessment?: No apparent visual deficits   Perception     Praxis      Cognition Arousal/Alertness: Awake/alert Behavior During Therapy: Flat affect Overall Cognitive Status: No family/caregiver present to determine baseline cognitive functioning Area of Impairment: Problem solving;Following commands                       Following Commands: Follows multi-step commands with increased time     Problem Solving: Slow processing;Requires  verbal cues General Comments: Pt following all commands today.        Exercises     Shoulder Instructions       General Comments Pt 118/59 sitting- pt reporting dizziness. 95% O2 with mobility    Pertinent Vitals/ Pain       Pain Assessment: No/denies pain Faces Pain Scale: Hurts a little bit Pain Location: L foot Pain Descriptors / Indicators: Discomfort;Guarding Pain Intervention(s): Monitored during session  Home Living                                           Prior Functioning/Environment              Frequency  Min 2X/week        Progress Toward Goals  OT Goals(current goals can now be found in the care plan section)  Progress towards OT goals: Progressing toward goals  Acute Rehab OT Goals Patient Stated Goal: get better; d/Murphy to CIR  OT Goal Formulation: With patient Time For Goal Achievement: 05/28/19 Potential to Achieve Goals: Good ADL Goals Pt Will Perform Grooming: with min guard assist;standing Pt Will Perform Lower Body Bathing: with min assist;sit to/from stand;sitting/lateral leans Pt Will Perform Lower Body Dressing: with min assist;sitting/lateral leans;sit to/from stand Pt Will Transfer to Toilet: with min assist;ambulating;bedside commode Additional ADL Goal #1: Pt will recall and apply 1-3 ECS strategies independently to improve BADL function  Plan Discharge plan remains appropriate    Co-evaluation                 AM-PAC OT "6 Clicks" Daily Activity     Outcome Measure   Help from another person eating meals?: A Little Help from another person taking care of personal grooming?: A Little Help from another person toileting, which includes using toliet, bedpan, or urinal?: A Lot Help from another person bathing (including washing, rinsing, drying)?: A Lot Help from another person to put on and taking off regular upper body clothing?: A Little Help from another person to put on and taking off regular lower body clothing?: A Lot 6 Click Score: 15    End of Session Equipment Utilized During Treatment: Gait belt  OT Visit Diagnosis: Unsteadiness on feet (R26.81);Muscle weakness (generalized) (M62.81)   Activity Tolerance Patient tolerated treatment well   Patient Left in chair;with call bell/phone within reach;with chair alarm set   Nurse Communication Mobility status        Time: 5643-3295 OT Time Calculation (min): 26 min  Charges: OT General Charges $OT  Visit: 1 Visit OT Treatments $Self Care/Home Management : 8-22 mins $Therapeutic Activity: 8-22 mins  Jesse Murphy, OTR/L Acute Rehabilitation Services Pager: [phone] Office: [phone]    Jesse Murphy 05/21/2019, 5:26 PM

## 2019-05-21 NOTE — Progress Notes (Signed)
Patient ID: Jesse Murphy, male   DOB: [birth year], 86 y.o.   MRN: [id number] f    Advanced Heart Failure Rounding Note  PCP-Cardiologist: Kate Sable, MD   Subjective:    Tolerating iHD. Minimal urine production. Continues w/ hematuria. Urine in purewick canister w/ gross hematuria. Hgb stable at 11.   New diffuse erythematous rash on face. Developed overnight. Extremities and abdomen/torso clear. Non pruritic. ? Contact dermatitis as he did apply new facial moisturizer to face yesterday.  Appetite remains poor. Continues w/ TFs. Got nauseated this am after meds were given. Emesis x 1.   Laying in bed. States he feels "ok". No pain or dyspnea. VSS. Not interested in eating.    Objective:   Weight Range: 86.5 kg Body mass index is 29 kg/m.   Vital Signs:   Temp:  [97.6 F (36.4 C)-98.4 F (36.9 C)] 97.6 F (36.4 C) (04/05 1109) Pulse Rate:  [71-74] 73 (04/05 1109) Resp:  [13-17] 17 (04/05 1109) BP: (119-132)/(46-53) 132/53 (04/05 1109) SpO2:  [94 %-100 %] 94 % (04/05 1109) Weight:  [86.5 kg] 86.5 kg (04/05 0500) Last BM Date: 05/20/19  Weight change: Filed Weights   05/19/19 1507 05/20/19 0400 05/21/19 0500  Weight: 85.2 kg 87.5 kg 86.5 kg    Intake/Output:   Intake/Output Summary (Last 24 hours) at 05/21/2019 1435 Last data filed at 05/21/2019 1100 Gross per 24 hour  Intake 20 ml  Output 200 ml  Net -180 ml     Physical Exam   General:  elderly WM, Lying in bed. No resp difficulty, + diffuse erythematous rash on face  HEENT: normal + NGT  Anicteric  Neck: supple. no JVD. Carotids 2+ bilat; no bruits. No lymphadenopathy or thryomegaly appreciated. Cor: PMI nondisplaced. Regular rate & rhythm. No rubs, gallops or murmurs. + HD cath Lungs: clear no wheeze Abdomen: soft, nontender, nondistended. No hepatosplenomegaly. No bruits or masses. Good bowel sounds. Extremities: no cyanosis, clubbing, rash, trace bilateral LE edema Neuro: alert & oriented x 3, cranial  nerves grossly intact. moves all 4 extremities w/o difficulty. Affect pleasant   Telemetry   Sinus 70-80s Personally reviewed  Labs    CBC Recent Labs    05/20/19 0257 05/21/19 0135  WBC 7.1 7.7  HGB 11.0* 11.0*  HCT 35.6* 35.1*  MCV 92.7 92.4  PLT 87* 91*   Basic Metabolic Panel Recent Labs    05/20/19 0257 05/21/19 0135  NA 135 132*  K 4.0 4.4  CL 99 95*  CO2 27 25  GLUCOSE 108* 109*  BUN 86* 114*  CREATININE 4.13* 5.37*  CALCIUM 8.0* 7.8*  MG 2.1 2.2  PHOS 4.5 5.7*   Liver Function Tests Recent Labs    05/20/19 0257 05/21/19 0135  ALBUMIN 2.0* 2.0*   No results for input(s): LIPASE, AMYLASE in the last 72 hours. Cardiac Enzymes No results for input(s): CKTOTAL, CKMB, CKMBINDEX, TROPONINI in the last 72 hours.  BNP: BNP (last 3 results) Recent Labs    03/22/19 0401 03/26/19 0626 03/27/19 1255  BNP 340.4* 687.5* 800.4*    ProBNP (last 3 results) No results for input(s): PROBNP in the last 8760 hours.   D-Dimer No results for input(s): DDIMER in the last 72 hours. Hemoglobin A1C No results for input(s): HGBA1C in the last 72 hours. Fasting Lipid Panel No results for input(s): CHOL, HDL, LDLCALC, TRIG, CHOLHDL, LDLDIRECT in the last 72 hours. Thyroid Function Tests No results for input(s): TSH, T4TOTAL, T3FREE, THYROIDAB in the last 72  hours.  Invalid input(s): FREET3  Other results:   Imaging    No results found.   Medications:     Scheduled Medications: . sodium chloride   Intravenous Once  . aspirin  81 mg Oral Daily  . atorvastatin  10 mg Oral q1800  . calcium acetate (Phos Binder)  667 mg Per Tube TID WC  . chlorhexidine  15 mL Mouth Rinse BID  . Chlorhexidine Gluconate Cloth  6 each Topical Daily  . Chlorhexidine Gluconate Cloth  6 each Topical Q0600  . famotidine  10 mg Per Tube Daily  . feeding supplement (PRO-STAT SUGAR FREE 64)  60 mL Per Tube TID  . Gerhardt's butt cream   Topical BID  . hydrocortisone   Rectal  BID  . hydrocortisone  25 mg Rectal BID  . hydrocortisone cream   Topical BID  . insulin aspart  0-15 Units Subcutaneous TID WC  . insulin aspart  0-5 Units Subcutaneous QHS  . magic mouthwash  5 mL Oral TID  . mouth rinse  15 mL Mouth Rinse q12n4p  . metoCLOPramide  5 mg Oral TID AC  . multivitamin  1 tablet Per Tube QHS  . simethicone  80 mg Oral TID  . sodium chloride flush  10-40 mL Intracatheter Q12H    Infusions: . sodium chloride    . sodium chloride    . sodium chloride Stopped (05/08/19 1833)  . anticoagulant sodium citrate    . anticoagulant sodium citrate    . feeding supplement (NEPRO CARB STEADY)      PRN Medications: sodium chloride, sodium chloride, oxyCODONE **AND** acetaminophen, dextrose, diphenhydrAMINE, hydrOXYzine, lidocaine (PF), lidocaine-prilocaine, loperamide HCl, ondansetron (ZOFRAN) IV, pentafluoroprop-tetrafluoroeth, promethazine, Resource ThickenUp Clear, sodium chloride, sodium chloride flush, sodium chloride flush    Assessment/Plan   1. Acute systolic HF -> Cardiogenic shock - post-op echo on 03/20/19 EF 25-30% (pre-op 30-35%. In 12/2017 EF normal) - Required Impella support post CABG. Impella removed 2/23. Post-op TEE EF 40% - Has developed ESRD. Volume controlled through HD - Volume stable. Continue HD per nephrology  - Continue iHD on TThSat Schedule. Now off midodrine due to rash (unclear which medication caused).  - unable to tolerate GDMT with low BP and ESRD  2. CAD s/p CABG this admit (LIMA->LAD, SVG->OM, SVG->RCA on 03/15/19) - No s/s ischemia - Continue statin.  - Off ASA due to hematuria and BRBPR  3. Acute hypoxic respiratory failure with Pseudomonas PNA - s/p trach on 03/27/19 - completed meropenem for pseudomonas PNA - trach aspirate 3/5 w/ regrowth of Pseudomonas again.   - Completed antibiotic course.  - Trach decannulated 3/13. Stable on [place] - Stable no change   4. PAF - s/p MAZE and LAA occlusion - Remains in NSR. Off  amio due to rash (unclear which agent it was) - Can reconsider AC in near future as bladder issues stabilize.  - no change for now  5. AKI -> ESRD - due to shock/ATN.  - Tunneled cath placed 3/9  - He is tolerating iHD. Midodrine now off.  - Vein mapping complete. Nephrology considering permanent access but no formal plans   6. Hematuria -Cystoscopy 3/15 with fulgeration - Back to OR 3/28 with fulgeration and formalin installation - Continues to have evidence of hematuria. However Hgb stable at 11.0 - Monitor   7. Complete heart block with profound symptomatic bradycardia - likely vagal in nature - s/p emergent TVP 3/25 - resolved TVP out.   8. Debility, severe -  continue PT/OT - Hopefully to CIR soon.  9. F/E/N - Poor po intake.  - Continues w/ Cor-trak for TFs - Encourage po      10. Left Pleural Effusion  - moderate to large. - s/p PleurX on 3/8.  - TCTS managing. Draining M/Th  10. Rash - Initially w/ diffuse rash on extremities and torso w/ appearance of drug induced rash. - Steroids started 3/21 w/ improvement/ resolution. Now off prednisone - Now w/ new rash isolated to face (developed 4/4), most likely contact dermatis after use of new facial moisturizer. Pt and RN instructed to refrain from further use. Continue to monitor   11. Anemia: - Resolved - hgb 9.0 -> 7.9 -> 10.9 -> 11.2->11.4->10.7 -> 11.0  12. Dysphagia and BRBPR - ? Possible esophagitis +/- radiation proctitis - GI has seen.Treated with IV fluconazole.  - Resolved  13. Hyperkalemia - resolved. K 4.4 today  - lokelma as needed on non HD days.  Suspect he should be ready for CIR soon.    Glori Bickers, MD  2:35 PM    Patient seen and examined with the above-signed Advanced Practice Provider and/or Housestaff. I personally reviewed laboratory data, imaging studies and relevant notes. I independently examined the patient and formulated the important aspects of the plan. I have edited  the note to reflect any of my changes or salient points. I have personally discussed the plan with the patient and/or family.  Remains weak with poor appetite. Only able to get from bed to chair. Has new rash on face related to lotion.   Remains in NSR. Volume status ok. On iHD MWF. K 4.4.   Hopefully for CIR tomorrow.   Glori Bickers, MD  2:37 PM

## 2019-05-21 NOTE — Progress Notes (Addendum)
PleurX fluid removed today is 285ml, documented in patient fluid output Patient given crushed meds in applesauce and immediately vomits

## 2019-05-22 ENCOUNTER — Inpatient Hospital Stay (HOSPITAL_COMMUNITY)
Admission: RE | Admit: 2019-05-22 | Discharge: 2019-06-14 | DRG: 945 | Disposition: A | Discharge status: Home Health | Payer: Medicare Other | Source: Intra-hospital | Attending: Physical Medicine & Rehabilitation | Admitting: Physical Medicine & Rehabilitation

## 2019-05-22 ENCOUNTER — Inpatient Hospital Stay (HOSPITAL_COMMUNITY): Payer: Medicare Other

## 2019-05-22 ENCOUNTER — Encounter (HOSPITAL_COMMUNITY): Payer: Self-pay | Admitting: Physical Medicine & Rehabilitation

## 2019-05-22 ENCOUNTER — Other Ambulatory Visit: Payer: Self-pay

## 2019-05-22 DIAGNOSIS — T8249XA Other complication of vascular dialysis catheter, initial encounter: Secondary | ICD-10-CM | POA: Diagnosis not present

## 2019-05-22 DIAGNOSIS — K627 Radiation proctitis: Secondary | ICD-10-CM | POA: Diagnosis present

## 2019-05-22 DIAGNOSIS — N2581 Secondary hyperparathyroidism of renal origin: Secondary | ICD-10-CM | POA: Diagnosis present

## 2019-05-22 DIAGNOSIS — D62 Acute posthemorrhagic anemia: Secondary | ICD-10-CM | POA: Diagnosis present

## 2019-05-22 DIAGNOSIS — Z7901 Long term (current) use of anticoagulants: Secondary | ICD-10-CM

## 2019-05-22 DIAGNOSIS — R42 Dizziness and giddiness: Secondary | ICD-10-CM | POA: Diagnosis not present

## 2019-05-22 DIAGNOSIS — E875 Hyperkalemia: Secondary | ICD-10-CM | POA: Diagnosis present

## 2019-05-22 DIAGNOSIS — R310 Gross hematuria: Secondary | ICD-10-CM | POA: Diagnosis present

## 2019-05-22 DIAGNOSIS — E871 Hypo-osmolality and hyponatremia: Secondary | ICD-10-CM | POA: Diagnosis present

## 2019-05-22 DIAGNOSIS — Y848 Other medical procedures as the cause of abnormal reaction of the patient, or of later complication, without mention of misadventure at the time of the procedure: Secondary | ICD-10-CM | POA: Diagnosis not present

## 2019-05-22 DIAGNOSIS — R7303 Prediabetes: Secondary | ICD-10-CM | POA: Diagnosis present

## 2019-05-22 DIAGNOSIS — Z79899 Other long term (current) drug therapy: Secondary | ICD-10-CM

## 2019-05-22 DIAGNOSIS — L270 Generalized skin eruption due to drugs and medicaments taken internally: Secondary | ICD-10-CM | POA: Diagnosis not present

## 2019-05-22 DIAGNOSIS — Z951 Presence of aortocoronary bypass graft: Secondary | ICD-10-CM

## 2019-05-22 DIAGNOSIS — L8990 Pressure ulcer of unspecified site, unspecified stage: Secondary | ICD-10-CM

## 2019-05-22 DIAGNOSIS — D649 Anemia, unspecified: Secondary | ICD-10-CM

## 2019-05-22 DIAGNOSIS — N186 End stage renal disease: Secondary | ICD-10-CM

## 2019-05-22 DIAGNOSIS — D696 Thrombocytopenia, unspecified: Secondary | ICD-10-CM | POA: Diagnosis present

## 2019-05-22 DIAGNOSIS — T85598A Other mechanical complication of other gastrointestinal prosthetic devices, implants and grafts, initial encounter: Secondary | ICD-10-CM

## 2019-05-22 DIAGNOSIS — R5381 Other malaise: Secondary | ICD-10-CM | POA: Diagnosis present

## 2019-05-22 DIAGNOSIS — N179 Acute kidney failure, unspecified: Secondary | ICD-10-CM

## 2019-05-22 DIAGNOSIS — I5021 Acute systolic (congestive) heart failure: Secondary | ICD-10-CM | POA: Diagnosis present

## 2019-05-22 DIAGNOSIS — R1310 Dysphagia, unspecified: Secondary | ICD-10-CM | POA: Diagnosis present

## 2019-05-22 DIAGNOSIS — D631 Anemia in chronic kidney disease: Secondary | ICD-10-CM | POA: Diagnosis present

## 2019-05-22 DIAGNOSIS — Z992 Dependence on renal dialysis: Secondary | ICD-10-CM

## 2019-05-22 DIAGNOSIS — N170 Acute kidney failure with tubular necrosis: Secondary | ICD-10-CM | POA: Diagnosis present

## 2019-05-22 DIAGNOSIS — L239 Allergic contact dermatitis, unspecified cause: Secondary | ICD-10-CM | POA: Diagnosis not present

## 2019-05-22 DIAGNOSIS — Z9689 Presence of other specified functional implants: Secondary | ICD-10-CM | POA: Diagnosis not present

## 2019-05-22 DIAGNOSIS — Z87891 Personal history of nicotine dependence: Secondary | ICD-10-CM

## 2019-05-22 DIAGNOSIS — E8809 Other disorders of plasma-protein metabolism, not elsewhere classified: Secondary | ICD-10-CM | POA: Diagnosis present

## 2019-05-22 DIAGNOSIS — Z4659 Encounter for fitting and adjustment of other gastrointestinal appliance and device: Secondary | ICD-10-CM

## 2019-05-22 DIAGNOSIS — I2510 Atherosclerotic heart disease of native coronary artery without angina pectoris: Secondary | ICD-10-CM | POA: Diagnosis present

## 2019-05-22 DIAGNOSIS — Z888 Allergy status to other drugs, medicaments and biological substances status: Secondary | ICD-10-CM

## 2019-05-22 DIAGNOSIS — R112 Nausea with vomiting, unspecified: Secondary | ICD-10-CM | POA: Diagnosis not present

## 2019-05-22 DIAGNOSIS — I132 Hypertensive heart and chronic kidney disease with heart failure and with stage 5 chronic kidney disease, or end stage renal disease: Secondary | ICD-10-CM | POA: Diagnosis present

## 2019-05-22 DIAGNOSIS — R1312 Dysphagia, oropharyngeal phase: Secondary | ICD-10-CM | POA: Diagnosis not present

## 2019-05-22 DIAGNOSIS — I480 Paroxysmal atrial fibrillation: Secondary | ICD-10-CM | POA: Diagnosis present

## 2019-05-22 DIAGNOSIS — R627 Adult failure to thrive: Secondary | ICD-10-CM | POA: Diagnosis present

## 2019-05-22 DIAGNOSIS — R34 Anuria and oliguria: Secondary | ICD-10-CM | POA: Diagnosis present

## 2019-05-22 DIAGNOSIS — E46 Unspecified protein-calorie malnutrition: Secondary | ICD-10-CM

## 2019-05-22 DIAGNOSIS — Z9981 Dependence on supplemental oxygen: Secondary | ICD-10-CM

## 2019-05-22 DIAGNOSIS — R6251 Failure to thrive (child): Secondary | ICD-10-CM

## 2019-05-22 DIAGNOSIS — Z8572 Personal history of non-Hodgkin lymphomas: Secondary | ICD-10-CM

## 2019-05-22 DIAGNOSIS — Z8546 Personal history of malignant neoplasm of prostate: Secondary | ICD-10-CM

## 2019-05-22 LAB — GLUCOSE, CAPILLARY
Glucose-Capillary: 134 mg/dL — ABNORMAL HIGH (ref 70–99)
Glucose-Capillary: 139 mg/dL — ABNORMAL HIGH (ref 70–99)
Glucose-Capillary: 191 mg/dL — ABNORMAL HIGH (ref 70–99)
Glucose-Capillary: 207 mg/dL — ABNORMAL HIGH (ref 70–99)

## 2019-05-22 LAB — RENAL FUNCTION PANEL
Albumin: 2 g/dL — ABNORMAL LOW (ref 3.5–5.0)
Albumin: 2.1 g/dL — ABNORMAL LOW (ref 3.5–5.0)
Anion gap: 16 — ABNORMAL HIGH (ref 5–15)
Anion gap: 13 (ref 5–15)
BUN: 135 mg/dL — ABNORMAL HIGH (ref 8–23)
BUN: 149 mg/dL — ABNORMAL HIGH (ref 8–23)
CO2: 22 mmol/L (ref 22–32)
CO2: 22 mmol/L (ref 22–32)
Calcium: 7.9 mg/dL — ABNORMAL LOW (ref 8.9–10.3)
Calcium: 7.7 mg/dL — ABNORMAL LOW (ref 8.9–10.3)
Chloride: 95 mmol/L — ABNORMAL LOW (ref 98–111)
Chloride: 96 mmol/L — ABNORMAL LOW (ref 98–111)
Creatinine, Ser: 6.29 mg/dL — ABNORMAL HIGH (ref 0.61–1.24)
Creatinine, Ser: 7.12 mg/dL — ABNORMAL HIGH (ref 0.61–1.24)
GFR calc Af Amer: 8 mL/min — ABNORMAL LOW (ref >60)
GFR calc Af Amer: 7 mL/min — ABNORMAL LOW (ref >60)
GFR calc non Af Amer: 7 mL/min — ABNORMAL LOW (ref >60)
GFR calc non Af Amer: 6 mL/min — ABNORMAL LOW (ref >60)
Glucose, Bld: 161 mg/dL — ABNORMAL HIGH (ref 70–99)
Glucose, Bld: 220 mg/dL — ABNORMAL HIGH (ref 70–99)
Phosphorus: 6.4 mg/dL — ABNORMAL HIGH (ref 2.5–4.6)
Phosphorus: 6.3 mg/dL — ABNORMAL HIGH (ref 2.5–4.6)
Potassium: 4.2 mmol/L (ref 3.5–5.1)
Potassium: 4.9 mmol/L (ref 3.5–5.1)
Sodium: 133 mmol/L — ABNORMAL LOW (ref 135–145)
Sodium: 131 mmol/L — ABNORMAL LOW (ref 135–145)

## 2019-05-22 LAB — CBC
HCT: 35.6 % — ABNORMAL LOW (ref 39.0–52.0)
HCT: 35.7 % — ABNORMAL LOW (ref 39.0–52.0)
Hemoglobin: 11 g/dL — ABNORMAL LOW (ref 13.0–17.0)
Hemoglobin: 11.1 g/dL — ABNORMAL LOW (ref 13.0–17.0)
MCH: 28.9 pg (ref 26.0–34.0)
MCH: 28.8 pg (ref 26.0–34.0)
MCHC: 30.9 g/dL (ref 30.0–36.0)
MCHC: 31.1 g/dL (ref 30.0–36.0)
MCV: 93.7 fL (ref 80.0–100.0)
MCV: 92.5 fL (ref 80.0–100.0)
Platelets: 99 10*3/uL — ABNORMAL LOW (ref 150–400)
Platelets: 101 10*3/uL — ABNORMAL LOW (ref 150–400)
RBC: 3.8 MIL/uL — ABNORMAL LOW (ref 4.22–5.81)
RBC: 3.86 MIL/uL — ABNORMAL LOW (ref 4.22–5.81)
RDW: 18.6 % — ABNORMAL HIGH (ref 11.5–15.5)
RDW: 18.6 % — ABNORMAL HIGH (ref 11.5–15.5)
WBC: 8.7 10*3/uL (ref 4.0–10.5)
WBC: 5.2 10*3/uL (ref 4.0–10.5)
nRBC: 0 % (ref 0.0–0.2)
nRBC: 0 % (ref 0.0–0.2)

## 2019-05-22 LAB — SEROTONIN RELEASE ASSAY (SRA)
SRA .2 IU/mL UFH Ser-aCnc: <1 % (ref 0–20)
SRA 100IU/mL UFH Ser-aCnc: <1 % (ref 0–20)

## 2019-05-22 LAB — MAGNESIUM: Magnesium: 2.2 mg/dL (ref 1.7–2.4)

## 2019-05-22 MED ORDER — ALTEPLASE 2 MG IJ SOLR
INTRAMUSCULAR | Status: AC
  Administered 2019-05-22: 4 mg
  Filled 2019-05-22: qty 4

## 2019-05-22 MED ORDER — FAMOTIDINE 20 MG PO TABS
10.0000 mg | ORAL_TABLET | Freq: Every day | ORAL | Status: DC
  Administered 2019-05-24 – 2019-06-14 (×21): 10 mg via ORAL
  Filled 2019-05-22 (×21): qty 1

## 2019-05-22 MED ORDER — CALCIUM ACETATE (PHOS BINDER) 667 MG/5ML PO SOLN
667.0000 mg | Freq: Three times a day (TID) | ORAL | Status: DC
  Administered 2019-05-22 – 2019-05-28 (×8): 667 mg
  Filled 2019-05-22 (×19): qty 5

## 2019-05-22 MED ORDER — SODIUM CHLORIDE 0.9 % IV SOLN: 100.0000 mL | INTRAVENOUS | Status: DC | PRN

## 2019-05-22 MED ORDER — DIPHENHYDRAMINE HCL 25 MG PO CAPS: 25.0000 mg | ORAL_CAPSULE | Freq: Four times a day (QID) | ORAL | Status: DC | PRN

## 2019-05-22 MED ORDER — ALTEPLASE 2 MG IJ SOLR: 2.0000 mg | Freq: Once | INTRAMUSCULAR | Status: DC | PRN

## 2019-05-22 MED ORDER — HYDROCORTISONE 1 % EX CREA
TOPICAL_CREAM | Freq: Two times a day (BID) | CUTANEOUS | Status: DC
  Filled 2019-05-22: qty 28

## 2019-05-22 MED ORDER — ACETAMINOPHEN 160 MG/5ML PO SOLN
325.0000 mg | ORAL | Status: DC | PRN
  Administered 2019-05-23 – 2019-05-28 (×3): 325 mg
  Filled 2019-05-22 (×4): qty 20.3

## 2019-05-22 MED ORDER — PREDNISONE 5 MG PO TABS: 10.0000 mg | ORAL_TABLET | Freq: Every day | ORAL | Status: AC

## 2019-05-22 MED ORDER — NEPRO/CARBSTEADY PO LIQD: 1000.0000 mL | ORAL | 0 refills | Status: DC

## 2019-05-22 MED ORDER — NEPRO/CARBSTEADY PO LIQD
1000.0000 mL | ORAL | Status: DC
  Administered 2019-05-22: 1000 mL via ORAL
  Filled 2019-05-22: qty 1000

## 2019-05-22 MED ORDER — METHYLPREDNISOLONE SODIUM SUCC 125 MG IJ SOLR
80.0000 mg | Freq: Once | INTRAMUSCULAR | Status: AC
  Administered 2019-05-22: 80 mg via INTRAVENOUS
  Filled 2019-05-22: qty 2

## 2019-05-22 MED ORDER — PRO-STAT SUGAR FREE PO LIQD
60.0000 mL | Freq: Three times a day (TID) | ORAL | Status: DC
  Administered 2019-05-23: 60 mL
  Filled 2019-05-22: qty 60

## 2019-05-22 MED ORDER — ALTEPLASE 2 MG IJ SOLR: 4.0000 mg | Freq: Once | INTRAMUSCULAR | Status: AC

## 2019-05-22 MED ORDER — SIMETHICONE 80 MG PO CHEW
80.0000 mg | CHEWABLE_TABLET | Freq: Three times a day (TID) | ORAL | Status: DC
  Administered 2019-05-22 – 2019-06-14 (×59): 80 mg via ORAL
  Filled 2019-05-22 (×62): qty 1

## 2019-05-22 MED ORDER — GERHARDT'S BUTT CREAM
TOPICAL_CREAM | Freq: Two times a day (BID) | CUTANEOUS | Status: DC
  Administered 2019-05-29: 1 via TOPICAL
  Filled 2019-05-22 (×3): qty 1

## 2019-05-22 MED ORDER — ANTICOAGULANT SODIUM CITRATE 4% (200MG/5ML) IV SOLN
5.0000 mL | Status: DC
  Administered 2019-05-30: 5 mL
  Administered 2019-06-09: 3.8 mL
  Administered 2019-06-13: 5 mL
  Filled 2019-05-22 (×9): qty 5
  Filled 2019-05-22: qty 3.8
  Filled 2019-05-22 (×3): qty 5

## 2019-05-22 MED ORDER — OXYCODONE HCL 5 MG/5ML PO SOLN: 5.0000 mg | ORAL | Status: DC | PRN

## 2019-05-22 MED ORDER — ASPIRIN 81 MG PO CHEW
81.0000 mg | CHEWABLE_TABLET | Freq: Every day | ORAL | Status: DC
  Administered 2019-05-24 – 2019-06-14 (×21): 81 mg via ORAL
  Filled 2019-05-22 (×21): qty 1

## 2019-05-22 MED ORDER — HYDROCORTISONE ACETATE 25 MG RE SUPP
25.0000 mg | Freq: Two times a day (BID) | RECTAL | Status: DC
  Administered 2019-05-23 – 2019-06-14 (×35): 25 mg via RECTAL
  Filled 2019-05-22 (×38): qty 1

## 2019-05-22 MED ORDER — ANTICOAGULANT SODIUM CITRATE 4% (200MG/5ML) IV SOLN
5.0000 mL | Status: DC | PRN
  Filled 2019-05-22 (×2): qty 5

## 2019-05-22 MED ORDER — HYDROCORTISONE (PERIANAL) 2.5 % EX CREA
TOPICAL_CREAM | Freq: Two times a day (BID) | CUTANEOUS | Status: DC
  Administered 2019-05-29 – 2019-06-02 (×2): 1 via RECTAL
  Filled 2019-05-22 (×2): qty 28.35

## 2019-05-22 MED ORDER — LIDOCAINE HCL (PF) 1 % IJ SOLN: 5.0000 mL | INTRAMUSCULAR | Status: DC | PRN

## 2019-05-22 MED ORDER — SALINE SPRAY 0.65 % NA SOLN: 1.0000 | NASAL | Status: DC | PRN

## 2019-05-22 MED ORDER — RENA-VITE PO TABS
1.0000 | ORAL_TABLET | Freq: Every day | ORAL | Status: DC
  Administered 2019-05-23 – 2019-05-25 (×3): 1
  Filled 2019-05-22 (×4): qty 1

## 2019-05-22 MED ORDER — PENTAFLUOROPROP-TETRAFLUOROETH EX AERO: 1.0000 "application " | INHALATION_SPRAY | CUTANEOUS | Status: DC | PRN

## 2019-05-22 MED ORDER — INSULIN ASPART 100 UNIT/ML ~~LOC~~ SOLN
0.0000 [IU] | Freq: Three times a day (TID) | SUBCUTANEOUS | Status: DC
  Administered 2019-05-22: 5 [IU] via SUBCUTANEOUS
  Administered 2019-05-23: 2 [IU] via SUBCUTANEOUS
  Administered 2019-05-23: 3 [IU] via SUBCUTANEOUS
  Administered 2019-05-24: 2 [IU] via SUBCUTANEOUS
  Administered 2019-05-25: 13:00:00 3 [IU] via SUBCUTANEOUS
  Administered 2019-05-25: 2 [IU] via SUBCUTANEOUS
  Administered 2019-05-26: 3 [IU] via SUBCUTANEOUS
  Administered 2019-05-27: 09:00:00 1 [IU] via SUBCUTANEOUS
  Administered 2019-05-27 – 2019-06-10 (×10): 2 [IU] via SUBCUTANEOUS

## 2019-05-22 MED ORDER — LIDOCAINE-PRILOCAINE 2.5-2.5 % EX CREA: 1.0000 "application " | TOPICAL_CREAM | CUTANEOUS | Status: DC | PRN

## 2019-05-22 NOTE — Plan of Care (Signed)
  Problem: RH BOWEL ELIMINATION Goal: RH STG MANAGE BOWEL WITH ASSISTANCE Description: STG Manage Bowel with min Assistance. Outcome: Progressing Goal: RH STG MANAGE BOWEL W/MEDICATION W/ASSISTANCE Description: STG Manage Bowel with Medication with min Assistance. Outcome: Progressing   Problem: RH SKIN INTEGRITY Goal: RH STG SKIN FREE OF INFECTION/BREAKDOWN Description: Patients skin will remain free from FURTHER breakdown or infection with min/mod assist. Outcome: Progressing

## 2019-05-22 NOTE — Progress Notes (Signed)
Physical Therapy Treatment Patient Details Name: GAD AYMOND MRN: [id number] DOB: [birth year] Today's Date: 05/22/2019    History of Present Illness 86 y.o. male with PMH: h/o CAD s/p CABG and clipping of left atrial appendage, who presented with SOB on 2/1. He became hypoxic on 2/2 with pulmonary edema and needed to be intubated on 2/2 and extubated 2/5. Pt underwent impella placement and tracheostomy on 2/9. Pt started on CRRT 2/11. Impella removal and tracheostomy 2/23. CRRT restarted 3/1. Pt transitioned to intermittent HD. Pt on continuous bladder irrigation.s/p cystoscopy with evacuation of clot and fulgoration of bladder neck bleeders 3/15. Pt with bradycardia and with temporary pacer placed 3/26 and removed 3/27. Pt with Cystoscopy with clot evacuation and fulguration on 3/28.     PT Comments    Patient progressing slowly and anxious about going to dialysis today so self limited with activities.  Still mobilizing mostly min A for EOB and bedside activity.  Has very limited activity tolerance.  Feel CIR level rehab remains appropriate for progress mobility and tolerance to allow d/c home.     Follow Up Recommendations  CIR;Supervision for mobility/OOB     Equipment Recommendations  Other (comment)(TBA at next venue)    Recommendations for Other Services       Precautions / Restrictions Precautions Precautions: Fall;Sternal    Mobility  Bed Mobility Overal bed mobility: Needs Assistance Bed Mobility: Supine to Sit     Supine to sit: Min assist Sit to supine: Min assist   General bed mobility comments: UE support for trunk elevation and scooting hips to EOB, to supine assist for positioning, adjusting hips and shoulders  Transfers Overall transfer level: Needs assistance Equipment used: Rolling walker (2 wheeled) Transfers: Sit to/from Stand Sit to Stand: Min assist;From elevated surface         General transfer comment: for balance from slightly elevated  bed  Ambulation/Gait Ambulation/Gait assistance: Min assist Gait Distance (Feet): 2 Feet Assistive device: Rolling walker (2 wheeled) Gait Pattern/deviations: Step-to pattern;Shuffle     General Gait Details: side steps to [facility] only, pt worried about energy due to supposed to go to HD today   Stairs             Wheelchair Mobility    Modified Rankin (Stroke Patients Only)       Balance Overall balance assessment: Needs assistance Sitting-balance support: Feet supported Sitting balance-Leahy Scale: Good     Standing balance support: Bilateral upper extremity supported;During functional activity Standing balance-Leahy Scale: Poor Standing balance comment: Reliant on UE support and external assist                            Cognition Arousal/Alertness: Awake/alert Behavior During Therapy: WFL for tasks assessed/performed Overall Cognitive Status: No family/caregiver present to determine baseline cognitive functioning Area of Impairment: Problem solving;Following commands                       Following Commands: Follows multi-step commands with increased time     Problem Solving: Slow processing;Requires verbal cues        Exercises General Exercises - Lower Extremity Ankle Circles/Pumps: AROM;Both;10 reps Quad Sets: AROM;Both;10 reps;Supine Heel Slides: AROM;Both;10 reps Other Exercises Other Exercises: seated scapular squeezes w/ 5 sec hold x 10 seated EOB    General Comments General comments (skin integrity, edema, etc.): BP stable between supine and sitting (127/47 supine, 124/47 sitting)  Pertinent Vitals/Pain Pain Assessment: No/denies pain    Home Living                      Prior Function            PT Goals (current goals can now be found in the care plan section) Progress towards PT goals: Progressing toward goals    Frequency    Min 3X/week      PT Plan Current plan remains appropriate     Co-evaluation              AM-PAC PT "6 Clicks" Mobility   Outcome Measure  Help needed turning from your back to your side while in a flat bed without using bedrails?: A Little Help needed moving from lying on your back to sitting on the side of a flat bed without using bedrails?: A Little Help needed moving to and from a bed to a chair (including a wheelchair)?: A Lot Help needed standing up from a chair using your arms (e.g., wheelchair or bedside chair)?: A Lot Help needed to walk in hospital room?: A Lot Help needed climbing 3-5 steps with a railing? : A Lot 6 Click Score: 14    End of Session Equipment Utilized During Treatment: Oxygen Activity Tolerance: Patient limited by fatigue Patient left: in bed;with call bell/phone within reach;with nursing/sitter in room   PT Visit Diagnosis: Other abnormalities of gait and mobility (R26.89);Muscle weakness (generalized) (M62.81)     Time: 1000-1029 PT Time Calculation (min) (ACUTE ONLY): 29 min  Charges:  $Therapeutic Exercise: 8-22 mins $Therapeutic Activity: 8-22 mins                     Magda Kiel, [place] [facility] [phone] 05/22/2019    [name] 05/22/2019, 12:59 PM

## 2019-05-22 NOTE — Progress Notes (Addendum)
Patient ID: Jesse Murphy, male   DOB: 04/24/1928, 86 y.o.   MRN: [id number] f    Advanced Heart Failure Rounding Note  PCP-Cardiologist: Kate Sable, MD   Subjective:    Tolerating iHD TThSa. Oliguric. 450 cc in UOP charted yesterday. Continues w/ hematuria but hgb stable at 11.    New rash starting to spread (started on face). Now on neck and torso.   PO intake still poor. Getting TFs.   Objective:   Weight Range: 83.3 kg Body mass index is 27.93 kg/m.   Vital Signs:   Temp:  [97.6 F (36.4 C)-98.2 F (36.8 C)] 98.2 F (36.8 C) (04/06 0735) Pulse Rate:  [70-80] 80 (04/06 0735) Resp:  [12-19] 17 (04/06 0735) BP: (117-132)/(43-53) 119/46 (04/06 0735) SpO2:  [94 %-98 %] 97 % (04/06 0735) Weight:  [83.3 kg] 83.3 kg (04/06 0412) Last BM Date: 05/21/19  Weight change: Filed Weights   05/20/19 0400 05/21/19 0500 05/22/19 0412  Weight: 87.5 kg 86.5 kg 83.3 kg    Intake/Output:   Intake/Output Summary (Last 24 hours) at 05/22/2019 0851 Last data filed at 05/22/2019 0800 Gross per 24 hour  Intake 620 ml  Output 650 ml  Net -30 ml     Physical Exam   General:  elderly WM, Lying in bed. No resp difficulty, + diffuse erythematous rash on face, neck and torso (looks like contact rash) HEENT: normal + NGT   Anicteric  Neck: supple. no JVD. Carotids 2+ bilat; no bruits. No lymphadenopathy or thryomegaly appreciated. Cor: PMI nondisplaced. Regular rate & rhythm. No rubs, gallops or murmurs. + HD cath Lungs: clear no wheeze Abdomen: soft, nontender, nondistended. No hepatosplenomegaly. No bruits or masses. Good bowel sounds. Extremities: no cyanosis, clubbing, rash, tr-1+ edema Neuro: alert & oriented x 3, cranial nerves grossly intact. moves all 4 extremities w/o difficulty. Affect pleasant    Telemetry   NSR, mid 70s Personally reviewed  Labs    CBC Recent Labs    05/21/19 0135 05/22/19 0235  WBC 7.7 8.7  HGB 11.0* 11.0*  HCT 35.1* 35.6*  MCV 92.4 93.7    PLT 91* 99*   Basic Metabolic Panel Recent Labs    05/21/19 0135 05/22/19 0235  NA 132* 133*  K 4.4 4.2  CL 95* 95*  CO2 25 22  GLUCOSE 109* 161*  BUN 114* 135*  CREATININE 5.37* 6.29*  CALCIUM 7.8* 7.9*  MG 2.2 2.2  PHOS 5.7* 6.4*   Liver Function Tests Recent Labs    05/21/19 0135 05/22/19 0235  ALBUMIN 2.0* 2.0*   No results for input(s): LIPASE, AMYLASE in the last 72 hours. Cardiac Enzymes No results for input(s): CKTOTAL, CKMB, CKMBINDEX, TROPONINI in the last 72 hours.  BNP: BNP (last 3 results) Recent Labs    03/22/19 0401 03/26/19 0626 03/27/19 1255  BNP 340.4* 687.5* 800.4*    ProBNP (last 3 results) No results for input(s): PROBNP in the last 8760 hours.   D-Dimer No results for input(s): DDIMER in the last 72 hours. Hemoglobin A1C No results for input(s): HGBA1C in the last 72 hours. Fasting Lipid Panel No results for input(s): CHOL, HDL, LDLCALC, TRIG, CHOLHDL, LDLDIRECT in the last 72 hours. Thyroid Function Tests No results for input(s): TSH, T4TOTAL, T3FREE, THYROIDAB in the last 72 hours.  Invalid input(s): FREET3  Other results:   Imaging    DG Chest Port 1 View  Result Date: 05/22/2019 CLINICAL DATA:  Status post coronary bypass grafting EXAM: PORTABLE CHEST 1  VIEW COMPARISON:  05/19/2019 FINDINGS: Postsurgical changes are again seen. Feeding catheter is again identified and stable as is a left jugular dialysis catheter. Lungs are well aerated bilaterally. Minimal vascular congestion is noted without interstitial edema. Small posteriorly layering left-sided pleural effusion is seen. Left PleurX tube is again noted and stable. IMPRESSION: Mild vascular congestion without interstitial edema. Small left pleural effusion posteriorly. Tubes and lines as described. Electronically Signed   By: Inez Catalina M.D.   On: 05/22/2019 08:43     Medications:     Scheduled Medications: . sodium chloride   Intravenous Once  . aspirin  81 mg  Oral Daily  . calcium acetate (Phos Binder)  667 mg Per Tube TID WC  . chlorhexidine  15 mL Mouth Rinse BID  . Chlorhexidine Gluconate Cloth  6 each Topical Daily  . Chlorhexidine Gluconate Cloth  6 each Topical Q0600  . famotidine  10 mg Per Tube Daily  . feeding supplement (PRO-STAT SUGAR FREE 64)  60 mL Per Tube TID  . Gerhardt's butt cream   Topical BID  . hydrocortisone   Rectal BID  . hydrocortisone  25 mg Rectal BID  . hydrocortisone cream   Topical BID  . insulin aspart  0-15 Units Subcutaneous TID WC  . insulin aspart  0-5 Units Subcutaneous QHS  . magic mouthwash  5 mL Oral TID  . mouth rinse  15 mL Mouth Rinse q12n4p  . methylPREDNISolone (SOLU-MEDROL) injection  80 mg Intravenous Once  . multivitamin  1 tablet Per Tube QHS  . simethicone  80 mg Oral TID  . sodium chloride flush  10-40 mL Intracatheter Q12H    Infusions: . sodium chloride    . sodium chloride    . sodium chloride Stopped (05/08/19 1833)  . anticoagulant sodium citrate    . anticoagulant sodium citrate    . feeding supplement (NEPRO CARB STEADY) 40 mL/hr at 05/22/19 0800    PRN Medications: sodium chloride, sodium chloride, oxyCODONE **AND** acetaminophen, dextrose, diphenhydrAMINE, hydrOXYzine, lidocaine (PF), lidocaine-prilocaine, loperamide HCl, ondansetron (ZOFRAN) IV, pentafluoroprop-tetrafluoroeth, promethazine, Resource ThickenUp Clear, sodium chloride, sodium chloride flush, sodium chloride flush    Assessment/Plan   1. Acute systolic HF -> Cardiogenic shock - post-op echo on 03/20/19 EF 25-30% (pre-op 30-35%. In 12/2017 EF normal) - Required Impella support post CABG. Impella removed 2/23. Post-op TEE EF 40% - Has developed ESRD. Volume controlled through HD - Volume stable. Continue HD per nephrology  - Continue iHD on TThSat Schedule. Now off midodrine due to rash (unclear which medication caused).  - unable to tolerate GDMT with low BP and ESRD  2. CAD s/p CABG this admit (LIMA->LAD,  SVG->OM, SVG->RCA on 03/15/19) - No s/s ischemia - Holding atorvastatin due to new rash  - Off ASA due to hematuria and BRBPR  3. Acute hypoxic respiratory failure with Pseudomonas PNA - s/p trach on 03/27/19 - completed meropenem for pseudomonas PNA - trach aspirate 3/5 w/ regrowth of Pseudomonas again.   - Completed antibiotic course.  - Trach decannulated 3/13. Stable on [place] - Stable no change   4. PAF - s/p MAZE and LAA occlusion - Remains in NSR. Off amio due to rash (unclear which agent it was) - Can reconsider AC in near future as bladder issues stabilize.  - no change for now  5. AKI -> ESRD - due to shock/ATN.  - Tunneled cath placed 3/9  - He is tolerating iHD. Midodrine now off.  - Vein mapping complete. Nephrology  considering permanent access but no formal plans   6. Hematuria -Cystoscopy 3/15 with fulgeration - Back to OR 3/28 with fulgeration and formalin installation - Continues to have evidence of hematuria. However Hgb stable at 11.0 - Monitor   7. Complete heart block with profound symptomatic bradycardia - likely vagal in nature - s/p emergent TVP 3/25 - resolved TVP out.   8. Debility, severe - continue PT/OT - Hopefully to CIR soon.  9. F/E/N - Poor po intake.  - Continues w/ Cor-trak for TFs - Encourage po      10. Left Pleural Effusion  - moderate to large. - s/p PleurX on 3/8.  - TCTS managing. Draining M/Th  10. Rash - Initially w/ diffuse rash on extremities and torso w/ appearance of drug induced rash. - Steroids started 3/21 w/ improvement/ resolution.  - Developed new rash on 4/4, initially isolated to face and felt most likely contact dermatis after use of new facial moisturizer. However, rash now spreading to neck and upper torso. ? If related to Reglan and atorvastatin (both recently started). Will d/c both agents. - give a dose of methylprednisolone 80 mg IV x 1.  - monitor   11. Anemia: - Resolved - hgb 9.0 -> 7.9 -> 10.9 ->  11.2->11.4->10.7 -> 11.0  12. Dysphagia and BRBPR - ? Possible esophagitis +/- radiation proctitis - GI has seen.Treated with IV fluconazole.  - Resolved  13. Hyperkalemia - resolved. K 4.2 today  - lokelma as needed on non HD days.  Suspect he should be ready for CIR soon.    Jesse Jester, PA-C  8:51 AM     Patient seen and examined with the above-signed Advanced Practice Provider and/or Housestaff. I personally reviewed laboratory data, imaging studies and relevant notes. I independently examined the patient and formulated the important aspects of the plan. I have edited the note to reflect any of my changes or salient points. I have personally discussed the plan with the patient and/or family.  Has worsening rash on face. Now on neck and torso. Feels it is related to cream. No response to benadryl. Remains weak. Denies SOB, orthopnea or PND. Remains in NSR  Will give one dose solumedrol for rash. Plan for d/c to CIR today.   We will follow at a distance while he is there.  Glori Bickers, MD  5:13 PM

## 2019-05-22 NOTE — TOC Transition Note (Signed)
Transition of Care [facility]) - CM/SW Discharge Note   Patient Details  Name: Jesse Murphy MRN: [id number] Date of Birth: Jul 17, 1928  [facility] CM/SW Contact:  Zenon Mayo, RN Phone Number: 05/22/2019, 1:03 PM   Clinical Narrative:    Patient for dc to CIR today.    Final next level of care: IP Rehab Facility Barriers to Discharge: No Barriers Identified   Patient Goals and CMS Choice Patient states their goals for this hospitalization and ongoing recovery are:: CIR      Discharge Placement                       Discharge Plan and Services                  DME Agency: NA       HH Arranged: NA          Social Determinants of Health (SDOH) Interventions     Readmission Risk Interventions No flowsheet data found.

## 2019-05-22 NOTE — Progress Notes (Signed)
Patient was admitted to [facility] during shift accompanied by son. [facility] visitor policy reviewed and safety policy. The two understands. This Probation officer along with another nurse preformed the skin assessment noted was  bilateral ear pressure injuries stage 2 , Dti noted to right heel, stage 2 noted to left elbow and healing stage 2 noted to right buttock. MASD noted to groin and sacrum. All questions were answered and waiting dialysis later today. Adria Devon, LPN

## 2019-05-22 NOTE — Progress Notes (Addendum)
      [address]       [place] [address]             [phone]        9 Days Post-Op Procedure(s) (LRB): CYSTOSCOPY CLOT EVACUATION FULGRATION CYSTOGRAM AND INSTILLATION OF FORMALIN (N/A)  Subjective: Patient had nausea and vomited once after morning medications. He denies abdominal pain, nausea, or vomiting this am. "Rash not that itchy"  Objective: Vital signs in last 24 hours: Temp:  [97.6 F (36.4 C)-98.1 F (36.7 C)] 98.1 F (36.7 C) (04/06 0412) Pulse Rate:  [70-73] 73 (04/05 2359) Cardiac Rhythm: Normal sinus rhythm;Bundle branch block (04/05 2000) Resp:  [12-19] 19 (04/05 2359) BP: (117-132)/(43-53) 124/43 (04/06 0412) SpO2:  [94 %-98 %] 98 % (04/05 2359) Weight:  [83.3 kg] 83.3 kg (04/06 0412)   Current Weight  05/22/19 83.3 kg       Intake/Output from previous day: 04/05 0701 - 04/06 0700 In: 560 [P.O.:10; I.V.:10; NG/GT:540] Out: 650 [Urine:450]   Physical Exam:  Cardiovascular: RRR Pulmonary: Clear to auscultation on the right and slightly diminished left base Abdomen: Soft, non tender, bowel sounds present. Extremities: Mild bilateral lower extremity edema. Rash on face, upper chest and tiny areas on LEs Wounds: Clean and dry.  No erythema or signs of infection.  Lab Results: CBC: Recent Labs    05/21/19 0135 05/22/19 0235  WBC 7.7 8.7  HGB 11.0* 11.0*  HCT 35.1* 35.6*  PLT 91* 99*   BMET:  Recent Labs    05/21/19 0135 05/22/19 0235  NA 132* 133*  K 4.4 4.2  CL 95* 95*  CO2 25 22  GLUCOSE 109* 161*  BUN 114* 135*  CREATININE 5.37* 6.29*  CALCIUM 7.8* 7.9*    PT/INR:  Lab Results  Component Value Date   INR 1.7 (H) 03/27/2019   INR 1.6 (H) 03/16/2019   INR 1.2 03/15/2019   ABG:  INR: Will add last result for INR, ABG once components are confirmed Will add last 4 CBG results once components are confirmed  Assessment/Plan:  1. CV - S/p MAZE and LA clip. Previous PAF, CHB with significant  bradycardia.  SR with HR in the 70's. Not on ec asa secondary to hematuria and BRBPR 2.  Pulmonary - On 1 liter of oxygen via [place]. Left pleural effusion;s/p left Pleur X on 03/08. Currently, being drained Mon and Thursday. CXR this am appears stable. 3.  DM-CBGs 88/119/134. On Insulin. Pre op HGA1C 6.1 4.  Anemia due to critical illness- H and H this am stable at 11 and 35.6 5. AKI progressed to ESRD-creatinine this am 6.29. Nephrology following and arranging for HD (M/W/Fri). He will likely need more permanent access at some point 6. Thrombocytopenia-platelets this am slightly increased to 99,000.HIT neg but would be best to avoid heparin if able for multiple reasons with previous melena/hematuria issues 7. Acute systolic heart failure-volume status controlled by HD 8. Severely deconditioned-continue PT/OT 9. GI-poor oral intake. Has [name]. On Dysphagia II diet. Will ask speech pathology to evaluate again today. 10. Diffuse erythematous rash (contact dermatitis) mostly on face and upper chest-etiology? He used some type of facial moisturizer yesterday and I restarted Atorvastatin. Will stop. Methyl prednisone ordered Atorvastatin for now and monitor. 11. Hopefully, to [place] 05/22/2019,7:03 AM

## 2019-05-22 NOTE — Progress Notes (Signed)
PMR Admission Coordinator Pre-Admission Assessment   Patient: Jesse Murphy is an 86 y.o., male MRN: [id number] DOB: 1928-09-18 Height: 5' 7.99" (172.7 cm) Weight: 83.3 kg   Insurance Information HMO:     PPO:      PCP:      IPA:      80/20:      OTHER:  PRIMARY: Medicare A and B      Policy#: [id number]      Subscriber: pt CM Name:       Phone#:      Fax#:  Pre-Cert#: verified Civil engineer, contracting:  Benefits:  Phone #:      Name:  Eff. Date: A and B 01/16/96     Deduct: $1484      Out of Pocket Max: n/a      Life Max: n/a CIR: 100%      SNF: 20 full days Outpatient: 80%     Co-Pay: 20% Home Health: 100%      Co-Pay:  DME: 80%     Co-Pay: 20% Providers: pt choice   SECONDARY: Tricare for Life      Policy#: [id number]      Subscriber: pt CM Name:       Phone#:      Fax#:  Pre-Cert#:       Employer:  Benefits:  Phone #: [phone]     Name:  Eff. Date:      Deduct:       Out of Pocket Max:       Life Max:  CIR:       SNF:  Outpatient:      Co-Pay:  Home Health:       Co-Pay:  DME:      Co-Pay:    Medicaid Application Date:       Case Manager:  Disability Application Date:       Case Worker:    The "Data Collection Information Summary" for patients in Inpatient Rehabilitation Facilities with attached "Privacy Act [place] Records" was provided and verbally reviewed with: Patient and Family   Emergency Contact Information         Contact Information     Name Relation Home Work Mobile    Sawa,[name] Spouse [phone]        Andree Elk Daughter [phone]   [phone]    Ahad, Colarusso [phone]   [phone]         Current Medical History  Patient Admitting Diagnosis: cardiac debility   History of Present Illness: Jesse Murphy is an 86 year old right-handed male with history of CAD, chronic atrial fibrillation on Xarelto as well as non-Hodgkin's lymphoma, chronic anemia, hypertension, diastolic congestive heart failure, prostate cancer.    Presented to [facility] 03/13/2019 with tachycardia as well as vague associated chest discomfort and palpitations.  Initially heart rate 200 bpm was given intravenous Cardizem.  Initial labs hemoglobin 11.3, platelets 187,000, sodium 138, potassium 3.9, creatinine 0.96 initial at bedtime troponin 32 with repeat values of 440, 926 and 585.  Chest x-ray showed no evidence of consolidation or effusion.  EKG showed atrial fibrillation with RVR heart rate 145 with no left bundle branch block.  DCCV was attempted in the ED but he remained in atrial fibrillation.  Echocardiogram showed ejection fraction of 35% as compared to previous echoes 55 to 60% 12/2017.  He was transferred to [facility] for further evaluation.  Cardiology service follow-up and patient with cardiac catheterization  with LM/LAD and RCA disease.  Patient underwent CABG x3 left atrial Maze procedure 03/17/2019 per Dr. Darcey Nora.  Impella removed 04/10/2019 patient did require long-term intubation ultimately due to acute hypoxic respiratory failure tracheostomy tube completed 03/27/2019 he was decannulated 04/28/2019 stable nasal cannula.  Patient did have left pleural effusion necessitating need for Pleurx catheter 04/23/2019.  He developed a rash felt to be possibly drug-induced responded well to prednisone taper.  Nephrology consulted 03/29/2019 for AKI with creatinine 4.43 from baseline 1.7.  Patient ultimately required hemodialysis and currently remains on schedule Monday Wednesday Friday.  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  Palliative care has been consulted to establish goals of care.  Urology services consulted 04/04/2019 for urinary retention as well as hemorrhagic cystitis underwent cystogram 05/13/2019 a small to moderate amount of clot was evacuated.  Ongoing bouts of anemia transfused as needed latest hemoglobin 11,  platelets 99,000 felt to be induced by heparin during hemodialysis with HIT screen and serotonin release results pending.  Patient would have no more heparin for HD until HIT is ruled out.  He currently on a dysphagia #2, thin liquid diet as well as nasogastric tube for nutritional support. Meds to be crushed in puree.  Therapy evaluations completed and patient was recommended for a comprehensive rehab program.   Patient's medical record from [facility] has been reviewed by the rehabilitation admission coordinator and physician.   Past Medical History      Past Medical History:  Diagnosis Date  . Atrial fibrillation ([place])    . CAD (coronary artery disease)      a. s/p PCI in 1992 b. Coronary CT in 01/2018 showing extensive coronary calcification; FFR indeterminate --> medical management pursued at that time as patient not interested in repeat cath  . Cancer [facility])      prostate  . Dyspnea    . Hypertension        Family History   family history includes CAD in his brother and sister; CAD (age of onset: 22) in his mother; CAD (age of onset: 64) in his father.   Prior Rehab/Hospitalizations Has the patient had prior rehab or hospitalizations prior to admission? Yes   Has the patient had major surgery during 100 days prior to admission? Yes              Current Medications   Current Facility-Administered Medications:  .  0.9 %  sodium chloride infusion (Manually program via Guardrails IV Fluids), , Intravenous, Once, Rexene Alberts, MD .  0.9 %  sodium chloride infusion, 100 mL, Intravenous, PRN, Irine Seal, MD .  0.9 %  sodium chloride infusion, 100 mL, Intravenous, PRN, Irine Seal, MD .  0.9 %  sodium chloride infusion, , Intravenous, Continuous, Irine Seal, MD, Stopped at 05/08/19 1833 .  oxyCODONE (ROXICODONE) 5 MG/5ML solution 5 mg, 5 mg, Per Tube, Q4H PRN, 5 mg at 05/13/19 2309 **AND** acetaminophen (TYLENOL) 160 MG/5ML solution 325 mg, 325 mg, Per Tube, Q4H PRN, Prescott Gum, Collier Salina, MD, 325 mg at 05/12/19 0035 .  anticoagulant sodium citrate solution 5 mL, 5 mL, Intracatheter, Q M,W,F-HD, Prescott Gum, Peter, MD, 5 mL at 05/19/19 1511 .  anticoagulant sodium citrate solution 5 mL, 5 mL, Intracatheter, Once, Elmarie Shiley, MD .  aspirin chewable tablet 81 mg, 81 mg, Oral, Daily, Prescott Gum, Collier Salina, MD, 81 mg at 05/22/19 1201 .  calcium acetate (Phos  Binder) [facility]) 667 MG/5ML oral solution 667 mg, 667 mg, Per Tube, TID WC, Prescott Gum, Collier Salina, MD, 667 mg at 05/20/19 1725 .  chlorhexidine (PERIDEX) 0.12 % solution 15 mL, 15 mL, Mouth Rinse, BID, Irine Seal, MD, 15 mL at 05/21/19 2158 .  Chlorhexidine Gluconate Cloth 2 % PADS 6 each, 6 each, Topical, Daily, Prescott Gum, Collier Salina, MD, 6 each at 05/22/19 1051 .  Chlorhexidine Gluconate Cloth 2 % PADS 6 each, 6 each, Topical, Q0600, Rosita Fire, MD, 6 each at 05/22/19 [phone] .  dextrose 50 % solution 0-50 mL, 0-50 mL, Intravenous, PRN, Irine Seal, MD .  diphenhydrAMINE (BENADRYL) capsule 25 mg, 25 mg, Oral, Q6H PRN, Melrose Nakayama, MD .  famotidine (PEPCID) tablet 10 mg, 10 mg, Per Tube, Daily, Donnamae Jude, RPH, 10 mg at 05/22/19 1202 .  feeding supplement (NEPRO CARB STEADY) liquid 1,000 mL, 1,000 mL, Oral, Continuous, Prescott Gum, Collier Salina, MD, Last Rate: 40 mL/hr at 05/22/19 0800, Rate Verify at 05/22/19 0800 .  feeding supplement (PRO-STAT SUGAR FREE 64) liquid 60 mL, 60 mL, Per Tube, TID, Prescott Gum, Collier Salina, MD, 60 mL at 05/21/19 2158 .  Gerhardt's butt cream, , Topical, BID, Irine Seal, MD, Given at 05/22/19 1052 .  hydrocortisone (ANUSOL-HC) 2.5 % rectal cream, , Rectal, BID, Irine Seal, MD, Given at 05/20/19 2039 .  hydrocortisone (ANUSOL-HC) suppository 25 mg, 25 mg, Rectal, BID, Prescott Gum, Collier Salina, MD, 25 mg at 05/21/19 2217 .  hydrocortisone cream 1 %, , Topical, BID, Melrose Nakayama, MD, Given at 05/22/19 1053 .  hydrOXYzine (ATARAX) 10 MG/5ML syrup 10 mg, 10 mg, Per Tube, Q6H PRN, Bensimhon, Shaune Pascal,  MD .  insulin aspart (novoLOG) injection 0-15 Units, 0-15 Units, Subcutaneous, TID WC, Ivin Poot, MD, 2 Units at 05/22/19 [phone] .  insulin aspart (novoLOG) injection 0-5 Units, 0-5 Units, Subcutaneous, QHS, Prescott Gum, Collier Salina, MD, 2 Units at 05/10/19 2341 .  lidocaine (PF) (XYLOCAINE) 1 % injection 5 mL, 5 mL, Intradermal, PRN, Irine Seal, MD .  lidocaine-prilocaine (EMLA) cream 1 application, 1 application, Topical, PRN, Irine Seal, MD .  loperamide HCl (IMODIUM) 1 MG/7.5ML suspension 4 mg, 4 mg, Per Tube, PRN, Ivin Poot, MD .  magic mouthwash, 5 mL, Oral, TID, Prescott Gum, Collier Salina, MD, 5 mL at 05/20/19 2038 .  MEDLINE mouth rinse, 15 mL, Mouth Rinse, q12n4p, Irine Seal, MD, 15 mL at 05/20/19 1300 .  multivitamin (RENA-VIT) tablet 1 tablet, 1 tablet, Per Tube, QHS, Prescott Gum, Collier Salina, MD, 1 tablet at 05/21/19 2158 .  ondansetron (ZOFRAN) injection 4 mg, 4 mg, Intravenous, Q6H PRN, Irine Seal, MD, 4 mg at 05/22/19 1048 .  pentafluoroprop-tetrafluoroeth (GEBAUERS) aerosol 1 application, 1 application, Topical, PRN, Irine Seal, MD .  promethazine (PHENERGAN) injection 6.25 mg, 6.25 mg, Intravenous, Q6H PRN, Irine Seal, MD, 6.25 mg at 05/19/19 1307 .  Resource ThickenUp Clear, , Oral, PRN, Irine Seal, MD, Given at 05/02/19 [phone] .  simethicone (MYLICON) chewable tablet 80 mg, 80 mg, Oral, TID, Prescott Gum, Collier Salina, MD, 80 mg at 05/22/19 1202 .  sodium chloride (OCEAN) 0.65 % nasal spray 1 spray, 1 spray, Each Nare, PRN, Gold, Wayne E, PA-C .  sodium chloride flush (NS) 0.9 % injection 10-40 mL, 10-40 mL, Intracatheter, Q12H, Prescott Gum, Collier Salina, MD, 10 mL at 05/22/19 1054 .  sodium chloride flush (NS) 0.9 % injection 10-40 mL, 10-40 mL, Intracatheter, PRN, Prescott Gum, Collier Salina, MD, 10 mL at 05/15/19 0911 .  sodium chloride flush (  NS) 0.9 % injection 3 mL, 3 mL, Intravenous, PRN, Irine Seal, MD, 3 mL at 04/16/19 0954   Patients Current Diet:     Diet Order                      DIET DYS 2 Room  service appropriate? No; Fluid consistency: Thin  Diet effective now                   Precautions / Restrictions Precautions Precautions: Fall, Sternal Precaution Booklet Issued: No Precaution Comments: slightly orthostatic Restrictions Weight Bearing Restrictions: Yes RUE Weight Bearing: Partial weight bearing RUE Partial Weight Bearing Percentage or Pounds: Can place ~20lbs through his UEs for mobility per MD Other Position/Activity Restrictions: sternal precautions    Has the patient had 2 or more falls or a fall with injury in the past year? Yes   Prior Activity Level Limited Community (1-2x/wk): independent, living with spouse, no AD prior to admission   Prior Functional Level Self Care: Did the patient need help bathing, dressing, using the toilet or eating? Independent   Indoor Mobility: Did the patient need assistance with walking from room to room (with or without device)? Independent   Stairs: Did the patient need assistance with internal or external stairs (with or without device)? Independent   Functional Cognition: Did the patient need help planning regular tasks such as shopping or remembering to take medications? Independent   Home Assistive Devices / Equipment Home Assistive Devices/Equipment: None Home Equipment: Other (comment)   Prior Device Use: Indicate devices/aids used by the patient prior to current illness, exacerbation or injury? None of the above   Current Functional Level Cognition   Overall Cognitive Status: No family/caregiver present to determine baseline cognitive functioning Difficult to assess due to: Tracheostomy Orientation Level: Oriented X4 Following Commands: Follows multi-step commands with increased time General Comments: Pt following all commands today.    Extremity Assessment (includes Sensation/Coordination)   Upper Extremity Assessment: Generalized weakness  Lower Extremity Assessment: Generalized weakness     ADLs    Overall ADL's : Needs assistance/impaired Eating/Feeding: Set up, Sitting Grooming: Set up, Sitting Grooming Details (indicate cue type and reason): Pt denying need to stand for task after mobility task Upper Body Bathing: Moderate assistance, Sitting Lower Body Bathing: Maximal assistance, Sit to/from stand Lower Body Bathing Details (indicate cue type and reason): to wash legs after incontinence episode Upper Body Dressing : Moderate assistance, Sitting Lower Body Dressing: Maximal assistance, Sit to/from stand Toilet Transfer: Minimal assistance, +2 for physical assistance, +2 for safety/equipment, RW Toilet Transfer Details (indicate cue type and reason): to [facility] after incontinence episode Toileting- Clothing Manipulation and Hygiene: Maximal assistance, Sit to/from stand Functional mobility during ADLs: Minimal assistance, +2 for physical assistance, Rolling walker General ADL Comments: Pt stand pivotting to recliner today. Set-upA for light grooming, but pt denied having energy for task.     Mobility   Overal bed mobility: Needs Assistance Bed Mobility: Supine to Sit Rolling: Min assist Supine to sit: Min assist Sit to supine: Min assist Sit to sidelying: Min assist General bed mobility comments: UE support for trunk elevation and scooting hips to EOB, to supine assist for positioning, adjusting hips and shoulders     Transfers   Overall transfer level: Needs assistance Equipment used: Rolling walker (2 wheeled) Transfer via Lift Equipment: Stedy Transfers: Sit to/from Stand Sit to Stand: Min assist, From elevated surface General transfer comment: for balance from slightly elevated bed  Ambulation / Gait / Stairs / Wheelchair Mobility   Ambulation/Gait Ambulation/Gait assistance: Herbalist (Feet): 2 Feet Assistive device: Rolling walker (2 wheeled) Gait Pattern/deviations: Step-to pattern, Shuffle General Gait Details: side steps to [facility] only, pt worried  about energy due to supposed to go to HD today Gait velocity: decr Gait velocity interpretation: <1.31 ft/sec, indicative of household ambulator     Posture / Balance Dynamic Sitting Balance Sitting balance - Comments: Min guard-Min A for EOB balance. Balance Overall balance assessment: Needs assistance Sitting-balance support: Feet supported Sitting balance-Leahy Scale: Good Sitting balance - Comments: Min guard-Min A for EOB balance. Postural control: Posterior lean Standing balance support: Bilateral upper extremity supported, During functional activity Standing balance-Leahy Scale: Poor Standing balance comment: Reliant on UE support and external assist     Special needs/care consideration BiPAP/CPAP no CPM no Continuous Drip IV no Dialysis yes        Days TTS Life Vest no Oxygen 1L/min, Nasal Cannula Special Bed no Trach Size no Wound Vac (area) no      Location n/a Skin incision to RLE and perineum; Stage 2 pressure injury to B ears and L buttocks, Deep Tissue Pressure injury to R heel                           Bowel mgmt: continent Bladder mgmt: purwick  Diabetic mgmt: yes Behavioral consideration no Chemo/radiation no    Previous Home Environment (from acute therapy documentation) Living Arrangements: Spouse/significant other Available Help at Discharge: Family, Available 24 hours/day Type of Home: House Home Layout: One level Home Access: Stairs to enter Technical brewer of Steps: [place]: No Additional Comments: lift chair   Discharge Living Setting Plans for Discharge Living Setting: Patient's home Type of Home at Discharge: House Discharge Home Layout: One level Discharge Home Access: Stairs to enter Entrance Stairs-Rails: None Entrance Stairs-Number of Steps: 4 Discharge Bathroom Shower/Tub: Tub/shower unit Discharge Bathroom Toilet: Standard Discharge Bathroom Accessibility: Yes How Accessible: Accessible via walker Does the  patient have any problems obtaining your medications?: No   Social/Family/Support Systems Patient Roles: Spouse Contact Information: [name] [phone] Anticipated Caregiver: Son Timmothy Sours), daughter Jackelyn Poling) Anticipated Caregiver's Contact Information: Timmothy Sours [phone] Ability/Limitations of Caregiver: supervision/min assist Caregiver Availability: 24/7 Discharge Plan Discussed with Primary Caregiver: Yes Is Caregiver In Agreement with Plan?: Yes Does Caregiver/Family have Issues with Lodging/Transportation while Pt is in Rehab?: No   Goals/Additional Needs Patient/Family Goal for Rehab: PT/OT supervision to mod I, SLP mod I Expected length of stay: 7-10 days Dietary Needs: dys 2/thin, meds crushed in puree Equipment Needs: tbd Special Service Needs: HD TRS Additional Information: needs to be CLIP'd, I have notified Terri Piedra Pt/Family Agrees to Admission and willing to participate: Yes Program Orientation Provided & Reviewed with Pt/Caregiver Including Roles  & Responsibilities: Yes  Barriers to Discharge: Medical stability, Hemodialysis   Decrease burden of Care through IP rehab admission: n/a   Possible need for SNF placement upon discharge: Not anticipated. Pt with good functional progress while on acute, and with good family support at discharge.    Patient Condition: I have reviewed medical records from [facility], spoken with CM, and patient and son. I met with patient at the bedside for inpatient rehabilitation assessment.  Patient will benefit from ongoing PT, OT and SLP, can actively participate in 3 hours of therapy a day 5 days of the week, and can make measurable gains during the  admission.  Patient will also benefit from the coordinated team approach during an Inpatient Acute Rehabilitation admission.  The patient will receive intensive therapy as well as Rehabilitation physician, nursing, social worker, and care management interventions.  Due to bladder  management, safety, skin/wound care, disease management, medication administration, pain management and patient education the patient requires 24 hour a day rehabilitation nursing.  The patient is currently min to mod assist with mobility and basic ADLs.  Discharge setting and therapy post discharge at home with home health is anticipated.  Patient has agreed to participate in the Acute Inpatient Rehabilitation Program and will admit today.   Preadmission Screen Completed By:  Michel Santee, PT, DPT 05/22/2019 1:08 PM ______________________________________________________________________   Discussed status with Dr. Dagoberto Ligas on 05/22/19  at 1:08 PM  and received approval for admission today.   Admission Coordinator:  Michel Santee, PT, DPT time 1:08 PM Sudie Grumbling 05/22/19     Assessment/Plan: Diagnosis: 1. Does the need for close, 24 hr/day Medical supervision in concert with the patient's rehab needs make it unreasonable for this patient to be served in a less intensive setting? Yes 2. Co-Morbidities requiring supervision/potential complications: CAD s/p CABG, new ESRD, L pleural effusion s/p pleurex, dysphagia- has Cor-trak; hematuria, bradycardia/heart block 3. Due to bladder management, bowel management, safety, skin/wound care, disease management, medication administration, pain management and patient education, does the patient require 24 hr/day rehab nursing? Yes 4. Does the patient require coordinated care of a physician, rehab nurse, PT, OT, and SLP to address physical and functional deficits in the context of the above medical diagnosis(es)? Yes Addressing deficits in the following areas: balance, endurance, locomotion, strength, transferring, bathing, dressing, feeding, grooming, toileting, cognition, swallowing and psychosocial support 5. Can the patient actively participate in an intensive therapy program of at least 3 hrs of therapy 5 days a week? Yes 6. The potential for patient to make  measurable gains while on inpatient rehab is fair 7. Anticipated functional outcomes upon discharge from inpatient rehab: modified independent and supervision PT, modified independent and supervision OT, modified independent and supervision SLP 8. Estimated rehab length of stay to reach the above functional goals is: 7-10 days 9. Anticipated discharge destination: Home 10. Overall Rehab/Functional Prognosis: fair     MD Signature:

## 2019-05-22 NOTE — H&P (Signed)
Physical Medicine and Rehabilitation Admission H&P        Chief Complaint  Patient presents with  . Tachycardia  : HPI: Jesse Murphy is an 86 year old right-handed male with history of CAD, chronic atrial fibrillation on Xarelto as well as non-Hodgkin's lymphoma, chronic anemia, hypertension, diastolic congestive heart failure, prostate cancer.  Per chart review patient lives with spouse in [place].  Independent prior to admission.  Wife uses a cane.  Local children check on him as needed.  Presented to [facility] 03/13/2019 with tachycardia as well as vague associated chest discomfort and palpitations.  Initially heart rate 200 bpm was given intravenous Cardizem.  Initial labs hemoglobin 11.3, platelets 187,000, sodium 138, potassium 3.9, creatinine 0.96 initial at bedtime troponin 32 with repeat values of 440, 926 and 585.  Chest x-ray showed no evidence of consolidation or effusion.  EKG showed atrial fibrillation with RVR heart rate 145 with no left bundle branch block.  DCCV was attempted in the ED but he remained in atrial fibrillation.  Echocardiogram showed ejection fraction of 35% as compared to previous echoes 55 to 60% 12/2017.  He was transferred to [facility] for further evaluation.  Cardiology service follow-up and patient with cardiac catheterization with LM/LAD and RCA disease.  Patient underwent CABG x3 left atrial Maze procedure 03/17/2019 per Dr. Darcey Nora.  Impella removed 04/10/2019 patient did require long-term intubation ultimately due to acute hypoxic respiratory failure tracheostomy tube completed 03/27/2019 he was decannulated 04/28/2019 stable nasal cannula.  Patient did have left pleural effusion necessitating need for Pleurx catheter 04/23/2019.  He developed a rash felt to be possibly drug-induced responded well to prednisone taper.  Nephrology consulted 03/29/2019 for AKI with creatinine 4.43 from baseline 1.7.  Patient ultimately required hemodialysis and  currently remains on schedule Monday Wednesday Friday.  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  Palliative care has been consulted to establish goals of care.  Urology services consulted 04/04/2019 for urinary retention as well as hemorrhagic cystitis underwent cystogram 05/13/2019 a small to moderate amount of clot was evacuated.  Ongoing bouts of anemia transfused as needed latest hemoglobin 11, platelets 99,000 felt to be induced by heparin during hemodialysis with HIT screen and serotonin release results pending.  Patient would have no more heparin for HD until HIT is ruled out.Marland Kitchen  He currently on a dysphagia #2 thin liquid diet as well as nasogastric tube for nutritional support.  Therapy evaluations completed and patient was admitted for a comprehensive rehab progra.   Pt reports he's NPO but he's on diet.  Having itching from recent rash/reaction to ABX- is slightly better.  Denies pain LBM this AM.  On O2 by [place]- wasn't at home.  Gets very fatigued from HD.   Review of Systems  Constitutional: Positive for diaphoresis. Negative for chills and fever.  Eyes: Negative for double vision.  Respiratory: Positive for shortness of breath.   Cardiovascular: Positive for chest pain, palpitations and leg swelling.  Gastrointestinal: Positive for blood in stool and constipation. Negative for heartburn, nausea and vomiting.  Genitourinary: Positive for hematuria. Negative for dysuria and flank pain.  Musculoskeletal: Positive for joint pain and myalgias.  All other systems reviewed and are negative.       Past Medical History:  Diagnosis Date  . Atrial fibrillation ([place])    . CAD (coronary artery disease)      a.  s/p PCI in 1992 b. Coronary CT in 01/2018 showing extensive coronary calcification; FFR indeterminate --> medical management pursued at that time as patient not interested in repeat  cath  . Cancer [facility])      prostate  . Dyspnea    . Hypertension           Past Surgical History:  Procedure Laterality Date  . CHEST TUBE INSERTION Left 04/23/2019    Procedure: INSERTION PLEURAL DRAINAGE CATHETER TO DRAIN LEFT PLEURAL EFFUSION;  Surgeon: Ivin Poot, MD;  Location: [place];  Service: Thoracic;  Laterality: Left;  . CLIPPING OF ATRIAL APPENDAGE N/A 03/16/2019    Procedure: CLIPPING OF LEFT  ATRIAL APPENDAGE - USING ATRICLIP SIZE 40;  Surgeon: Ivin Poot, MD;  Location: [place];  Service: Open Heart Surgery;  Laterality: N/A;  . COLONOSCOPY N/A 08/25/2017    Procedure: COLONOSCOPY;  Surgeon: Rogene Houston, MD;  [facility];  Service: Endoscopy;  Laterality: N/A;  . CORONARY ARTERY BYPASS GRAFT N/A 03/16/2019    Procedure: CORONARY ARTERY BYPASS GRAFTING (CABG), ON PUMP, TIMES THREE, USING LEFT INTERNAL MAMMARY ARTERY, RIGHT GREATER SAPHENOUS VEIN HARVESTED ENDOSCOPICALLY;  Surgeon: Ivin Poot, MD;  Location: [place];  Service: Open Heart Surgery;  Laterality: N/A;  swan only  . CYSTOSCOPY WITH FULGERATION N/A 04/30/2019    Procedure: CYSTOSCOPY WITH FULGERATION;  Surgeon: Irine Seal, MD;  Location: [place];  Service: Urology;  Laterality: N/A;  . HERNIA REPAIR      . IR FLUORO GUIDE CV LINE LEFT   04/23/2019  . IR US GUIDE VASC ACCESS LEFT   04/23/2019  . MAZE N/A 03/16/2019    Procedure: MAZE;  Surgeon: Ivin Poot, MD;  Location: [place];  Service: Open Heart Surgery;  Laterality: N/A;  . PLACEMENT OF IMPELLA LEFT VENTRICULAR ASSIST DEVICE Right 03/27/2019    Procedure: Placement Of Impella Left Ventricular Assist Device using ABIOMED Impella 5.5 with SmartAssist Device.;  Surgeon: Wonda Olds, MD;  [facility];  Service: Thoracic;  Laterality: Right;  . PROSTATE SURGERY      . REMOVAL OF IMPELLA LEFT VENTRICULAR ASSIST DEVICE N/A 04/10/2019    Procedure: REMOVAL OF IMPELLA LEFT VENTRICULAR ASSIST DEVICE WITH INSERTION OF RIGHT FEMORAL ARTERIAL  LINE;  Surgeon: Ivin Poot, MD;  Location: [place];  Service: Open Heart Surgery;  Laterality: N/A;  . RIGHT/LEFT HEART CATH AND CORONARY ANGIOGRAPHY N/A 03/15/2019    Procedure: RIGHT/LEFT HEART CATH AND CORONARY ANGIOGRAPHY;  Surgeon: Burnell Blanks, MD;  Location: [place] CV LAB;  Service: Cardiovascular;  Laterality: N/A;  . TEE WITHOUT CARDIOVERSION N/A 03/16/2019    Procedure: TRANSESOPHAGEAL ECHOCARDIOGRAM (TEE);  Surgeon: Prescott Gum, Collier Salina, MD;  Location: [place];  Service: Open Heart Surgery;  Laterality: N/A;  . TRACHEOSTOMY TUBE PLACEMENT N/A 03/27/2019    Procedure: TRACHEOSTOMY placed using Shiley 8DCT Cuffed.;  Surgeon: Wonda Olds, MD;  [facility];  Service: Thoracic;  Laterality: N/A;  . TRANSURETHRAL RESECTION OF BLADDER NECK N/A 05/13/2019    Procedure: CYSTOSCOPY CLOT EVACUATION FULGRATION CYSTOGRAM AND INSTILLATION OF FORMALIN;  Surgeon: Lucas Mallow, MD;  Location: [place];  Service: Urology;  Laterality: N/A;         Family History  Problem Relation Age of Onset  . CAD Mother 39  . CAD Father 52  . CAD Sister    . CAD Brother      Social History:  reports that he quit  smoking about 29 years ago. His smoking use included cigarettes. He has a 12.50 pack-year smoking history. He has never used smokeless tobacco. He reports that he does not drink alcohol or use drugs. Allergies:  Allergies  Allergen Reactions  . Heparin Other (See Comments)      Thrombocytopenia/ HIT          Medications Prior to Admission  Medication Sig Dispense Refill  . acetaminophen (TYLENOL) 325 MG tablet Take 325 mg by mouth every 8 (eight) hours as needed for moderate pain.       Marland Kitchen atorvastatin (LIPITOR) 10 MG tablet Take 10 mg by mouth.       . fluticasone (FLONASE) 50 MCG/ACT nasal spray daily as needed.       . furosemide (LASIX) 20 MG tablet Take 20 mg by mouth daily.       Marland Kitchen loratadine (CLARITIN) 10 MG tablet Take 10 mg by mouth daily.      . metoprolol tartrate  (LOPRESSOR) 50 MG tablet Take 1 tablet (50 mg total) by mouth 2 (two) times daily. 180 tablet 3  . pantoprazole (PROTONIX) 40 MG tablet Take 1 tablet (40 mg total) by mouth daily. 30 tablet 0  . Pediatric Multivitamins-Iron (FLINTSTONES PLUS IRON) chewable tablet Chew 1 tablet by mouth 2 (two) times daily.      . rivaroxaban (XARELTO) 20 MG TABS tablet Take 1 tablet (20 mg total) by mouth daily with supper.      . tamsulosin (FLOMAX) 0.4 MG CAPS capsule Take by mouth.          Drug Regimen Review Drug regimen was reviewed and remains appropriate with no significant issues identified   Home: Home Living Family/patient expects to be discharged to:: Private residence Living Arrangements: Spouse/significant other Available Help at Discharge: Family, Available 24 hours/day Type of Home: House Home Access: Stairs to enter Technical brewer of Steps: 4 Home Layout: One level Home Equipment: Other (comment) Additional Comments: lift chair   Functional History: Prior Function Level of Independence: Independent   Functional Status:  Mobility: Bed Mobility Overal bed mobility: Needs Assistance Bed Mobility: Sit to Supine Rolling: Min assist Supine to sit: Min assist Sit to supine: Mod assist Sit to sidelying: Min assist General bed mobility comments: Assist to lower trunk and bring legs back up into bed returning to supine Transfers Overall transfer level: Needs assistance Equipment used: 4-wheeled walker(EVA walker) Transfer via Lift Equipment: Stedy Transfers: Sit to/from Stand Sit to Stand: +2 safety/equipment, Max assist General transfer comment: Heavy assist to bring hips up and for balance.  Ambulation/Gait Ambulation/Gait assistance: +2 physical assistance, Mod assist Gait Distance (Feet): 3 Feet Assistive device: (EVA walker) Gait Pattern/deviations: Step-to pattern, Decreased step length - right, Decreased step length - left, Shuffle General Gait Details: Pt with  knees wanting to buckle. Only amb from recliner back over to bed Gait velocity: decr Gait velocity interpretation: <1.31 ft/sec, indicative of household ambulator   ADL: ADL Overall ADL's : Needs assistance/impaired Eating/Feeding: Set up, Sitting Grooming: Set up, Sitting Grooming Details (indicate cue type and reason): Pt denying need to stand for task after mobility task Upper Body Bathing: Moderate assistance, Sitting Lower Body Bathing: Maximal assistance, Sit to/from stand Lower Body Bathing Details (indicate cue type and reason): to wash legs after incontinence episode Upper Body Dressing : Moderate assistance, Sitting Lower Body Dressing: Maximal assistance, Sit to/from stand Toilet Transfer: Minimal assistance, +2 for physical assistance, +2 for safety/equipment, RW Toilet Transfer Details (indicate cue  type and reason): to [facility] after incontinence episode Toileting- Clothing Manipulation and Hygiene: Maximal assistance, Sit to/from stand Functional mobility during ADLs: Minimal assistance, +2 for physical assistance, +2 for safety/equipment, Rolling walker(EVA walker) General ADL Comments: Pt increasing activity tolerance.    Cognition: Cognition Overall Cognitive Status: No family/caregiver present to determine baseline cognitive functioning Orientation Level: Oriented to person, Oriented to place, Oriented to situation, Disoriented to time Cognition Arousal/Alertness: Awake/alert Behavior During Therapy: Flat affect Overall Cognitive Status: No family/caregiver present to determine baseline cognitive functioning Area of Impairment: Problem solving Problem Solving: Slow processing, Requires verbal cues General Comments: Pt light spirited and agreeable to therapy Difficult to assess due to: Tracheostomy   Physical Exam: Blood pressure (!) 133/50, pulse 81, temperature 98.1 F (36.7 C), temperature source Axillary, resp. rate 17, height 5' 7.99" (1.727 m), weight 85.7 kg,  SpO2 96 %. Physical Exam  Nursing note and vitals reviewed. Constitutional: He is oriented to person, place, and time.  Frail appearing older man laying in bed; supine, on O2 by [place] 1L- sats 96%, NAD; appropriate  HENT:  Nasogastric tube in place [name] in nares- O2 by [place] as well Rash fading esp from face esp No facial droop   Eyes: Conjunctivae are normal. No scleral icterus.  EOMI B/L No nystagmus  Neck: No tracheal deviation present.  Trach site almost healed over- - has dressing over it C/D/I  Cardiovascular:  RRR- no JVD seen  Respiratory: Effort normal. No stridor.  Pleurx catheter in place  Little coarse throughout- decreased breath sounds at bases L>R Juicy cough but not productive intermittently O2 by [place] 1L  GI:  Soft, NT, ND, (+) BS hypoactive   Genitourinary:    Genitourinary Comments: External male catheter with hematuria noted- has deep red wine colored urine in container on wall- connected to external catheter- per nurse, is chronic   Musculoskeletal:     Cervical back: Normal range of motion and neck supple.     Comments: UEs 4+/5 in deltoids, biceps, triceps, WE, grip and finger abd B/L RLE- HF 2/5, KE 2-/5, KF 3+/5, DF and PF 4+/5 LLE- HF 2+/5, KE 4-/5, KF 4-/5, DF 4/5, PF 4+/5  Wasn't able to turn over without nursing assistance  Neurological: He is alert and oriented to person, place, and time.  Patient is alert sitting up in chair.  Oriented x3 and follows commands. Sensation intact to light touch in all 4 extremities No clonus or homffan's noted B/L  Skin:  Midline chest incision clean and dry Blotchy erythematous rash on torso- esp chest and upper back- no pelvis area- Fading slightly- kept trying to scratch it 1-2+ UE swelling in hands, wrists and forearms B/L 2-3+ LE edema up to mid calves B/L Stage I nonblnachable around anus on horseshoe around buttocks 1 tiny spot- appears to be fraction, not pressure on R buttock- superficial Stage II? Slightly  open?  Psychiatric: He has a normal mood and affect.  Appropriate, calm, no anxiety      Lab Results Last 48 Hours        Results for orders placed or performed during the hospital encounter of 03/13/19 (from the past 48 hour(s))  Glucose, capillary     Status: Abnormal    Collection Time: 05/15/19 12:22 PM  Result Value Ref Range    Glucose-Capillary 113 (H) 70 - 99 mg/dL      Comment: Glucose reference range applies only to samples taken after fasting for at least 8 hours.  Glucose, capillary     Status: Abnormal    Collection Time: 05/15/19  4:19 PM  Result Value Ref Range    Glucose-Capillary 152 (H) 70 - 99 mg/dL      Comment: Glucose reference range applies only to samples taken after fasting for at least 8 hours.  Glucose, capillary     Status: Abnormal    Collection Time: 05/15/19  9:26 PM  Result Value Ref Range    Glucose-Capillary 151 (H) 70 - 99 mg/dL      Comment: Glucose reference range applies only to samples taken after fasting for at least 8 hours.  Renal function panel (daily at 0500)     Status: Abnormal    Collection Time: 05/16/19  5:32 AM  Result Value Ref Range    Sodium 135 135 - 145 mmol/L    Potassium 5.1 3.5 - 5.1 mmol/L    Chloride 96 (L) 98 - 111 mmol/L    CO2 26 22 - 32 mmol/L    Glucose, Bld 126 (H) 70 - 99 mg/dL      Comment: Glucose reference range applies only to samples taken after fasting for at least 8 hours.    BUN 153 (H) 8 - 23 mg/dL    Creatinine, Ser 5.19 (H) 0.61 - 1.24 mg/dL    Calcium 8.3 (L) 8.9 - 10.3 mg/dL    Phosphorus 6.4 (H) 2.5 - 4.6 mg/dL    Albumin 2.2 (L) 3.5 - 5.0 g/dL    GFR calc non Af Amer 9 (L) >60 mL/min    GFR calc Af Amer 11 (L) >60 mL/min    Anion gap 13 5 - 15      Comment: Performed at [facility] [address], [place], [place] [address]  Magnesium     Status: Abnormal    Collection Time: 05/16/19  5:32 AM  Result Value Ref Range    Magnesium 2.5 (H) 1.7 - 2.4 mg/dL      Comment: Performed at  [facility] [address] [place], [place] [address]  CBC     Status: Abnormal    Collection Time: 05/16/19  5:32 AM  Result Value Ref Range    WBC 7.1 4.0 - 10.5 K/uL    RBC 3.88 (L) 4.22 - 5.81 MIL/uL    Hemoglobin 11.2 (L) 13.0 - 17.0 g/dL    HCT 36.4 (L) 39.0 - 52.0 %    MCV 93.8 80.0 - 100.0 fL    MCH 28.9 26.0 - 34.0 pg    MCHC 30.8 30.0 - 36.0 g/dL    RDW 20.1 (H) 11.5 - 15.5 %    Platelets 78 (L) 150 - 400 K/uL      Comment: REPEATED TO VERIFY Immature Platelet Fraction may be clinically indicated, consider ordering this additional test [id number] CONSISTENT WITH PREVIOUS RESULT      nRBC 0.0 0.0 - 0.2 %      Comment: Performed at [facility], [place] [address] [place], [place] [address]  Glucose, capillary     Status: Abnormal    Collection Time: 05/16/19  6:30 AM  Result Value Ref Range    Glucose-Capillary 109 (H) 70 - 99 mg/dL      Comment: Glucose reference range applies only to samples taken after fasting for at least 8 hours.  Glucose, capillary     Status: Abnormal    Collection Time: 05/16/19 11:24 AM  Result Value Ref Range  Glucose-Capillary 111 (H) 70 - 99 mg/dL      Comment: Glucose reference range applies only to samples taken after fasting for at least 8 hours.    Comment 1 Notify RN      Comment 2 Document in Chart    Glucose, capillary     Status: Abnormal    Collection Time: 05/16/19  5:35 PM  Result Value Ref Range    Glucose-Capillary 136 (H) 70 - 99 mg/dL      Comment: Glucose reference range applies only to samples taken after fasting for at least 8 hours.    Comment 1 Notify RN      Comment 2 Document in Chart    Glucose, capillary     Status: Abnormal    Collection Time: 05/16/19  9:53 PM  Result Value Ref Range    Glucose-Capillary 137 (H) 70 - 99 mg/dL      Comment: Glucose reference range applies only to samples taken after fasting for at least 8 hours.  Renal function panel (daily at 0500)     Status: Abnormal     Collection Time: 05/17/19  2:38 AM  Result Value Ref Range    Sodium 137 135 - 145 mmol/L    Potassium 4.5 3.5 - 5.1 mmol/L    Chloride 100 98 - 111 mmol/L    CO2 27 22 - 32 mmol/L    Glucose, Bld 131 (H) 70 - 99 mg/dL      Comment: Glucose reference range applies only to samples taken after fasting for at least 8 hours.    BUN 74 (H) 8 - 23 mg/dL    Creatinine, Ser 3.06 (H) 0.61 - 1.24 mg/dL      Comment: DIALYSIS    Calcium 8.0 (L) 8.9 - 10.3 mg/dL    Phosphorus 4.7 (H) 2.5 - 4.6 mg/dL    Albumin 2.1 (L) 3.5 - 5.0 g/dL    GFR calc non Af Amer 17 (L) >60 mL/min    GFR calc Af Amer 20 (L) >60 mL/min    Anion gap 10 5 - 15      Comment: Performed at [facility] [address], [place], [place] [address]  Magnesium     Status: None    Collection Time: 05/17/19  2:38 AM  Result Value Ref Range    Magnesium 2.1 1.7 - 2.4 mg/dL      Comment: Performed at [facility] [address], [place], [place] [address]  CBC     Status: Abnormal    Collection Time: 05/17/19  6:18 AM  Result Value Ref Range    WBC 6.6 4.0 - 10.5 K/uL    RBC 4.01 (L) 4.22 - 5.81 MIL/uL    Hemoglobin 11.4 (L) 13.0 - 17.0 g/dL    HCT 37.4 (L) 39.0 - 52.0 %    MCV 93.3 80.0 - 100.0 fL    MCH 28.4 26.0 - 34.0 pg    MCHC 30.5 30.0 - 36.0 g/dL    RDW 19.6 (H) 11.5 - 15.5 %    Platelets 77 (L) 150 - 400 K/uL      Comment: REPEATED TO VERIFY Immature Platelet Fraction may be clinically indicated, consider ordering this additional test [id number] CONSISTENT WITH PREVIOUS RESULT      nRBC 0.0 0.0 - 0.2 %      Comment: Performed at [facility], [place] [address] [place], [place] [address]  Glucose, capillary  Status: Abnormal    Collection Time: 05/17/19  6:33 AM  Result Value Ref Range    Glucose-Capillary 106 (H) 70 - 99 mg/dL      Comment: Glucose reference range applies only to samples taken after fasting for at least 8 hours.       Imaging Results (Last 48 hours)  DG Swallowing  Func-Speech Pathology   Result Date: 05/15/2019 Objective Swallowing Evaluation: Type of Study: MBS-Modified Barium Swallow Study  Patient Details Name: Jesse Murphy MRN: [id number] Date of Birth: Aug 16, 1928 Today's Date: 05/15/2019 Time: SLP Start Time (ACUTE ONLY): 0347 -SLP Stop Time (ACUTE ONLY): 1300 SLP Time Calculation (min) (ACUTE ONLY): 15 min Past Medical History: Past Medical History: Diagnosis Date . Atrial fibrillation ([place])  . CAD (coronary artery disease)   a. s/p PCI in 1992 b. Coronary CT in 01/2018 showing extensive coronary calcification; FFR indeterminate --> medical management pursued at that time as patient not interested in repeat cath . Cancer [facility])   prostate . Dyspnea  . Hypertension  Past Surgical History: Past Surgical History: Procedure Laterality Date . CHEST TUBE INSERTION Left 04/23/2019  Procedure: INSERTION PLEURAL DRAINAGE CATHETER TO DRAIN LEFT PLEURAL EFFUSION;  Surgeon: Ivin Poot, MD;  Location: [place];  Service: Thoracic;  Laterality: Left; . CLIPPING OF ATRIAL APPENDAGE N/A 03/16/2019  Procedure: CLIPPING OF LEFT  ATRIAL APPENDAGE - USING ATRICLIP SIZE 40;  Surgeon: Ivin Poot, MD;  Location: [place];  Service: Open Heart Surgery;  Laterality: N/A; . COLONOSCOPY N/A 08/25/2017  Procedure: COLONOSCOPY;  Surgeon: Rogene Houston, MD;  [facility];  Service: Endoscopy;  Laterality: N/A; . CORONARY ARTERY BYPASS GRAFT N/A 03/16/2019  Procedure: CORONARY ARTERY BYPASS GRAFTING (CABG), ON PUMP, TIMES THREE, USING LEFT INTERNAL MAMMARY ARTERY, RIGHT GREATER SAPHENOUS VEIN HARVESTED ENDOSCOPICALLY;  Surgeon: Ivin Poot, MD;  Location: [place];  Service: Open Heart Surgery;  Laterality: N/A;  swan only . CYSTOSCOPY WITH FULGERATION N/A 04/30/2019  Procedure: CYSTOSCOPY WITH FULGERATION;  Surgeon: Irine Seal, MD;  Location: [place];  Service: Urology;  Laterality: N/A; . HERNIA REPAIR   . IR FLUORO GUIDE CV LINE LEFT  04/23/2019 . IR US GUIDE VASC ACCESS LEFT  04/23/2019  . MAZE N/A 03/16/2019  Procedure: MAZE;  Surgeon: Ivin Poot, MD;  Location: [place];  Service: Open Heart Surgery;  Laterality: N/A; . PLACEMENT OF IMPELLA LEFT VENTRICULAR ASSIST DEVICE Right 03/27/2019  Procedure: Placement Of Impella Left Ventricular Assist Device using ABIOMED Impella 5.5 with SmartAssist Device.;  Surgeon: Wonda Olds, MD;  [facility];  Service: Thoracic;  Laterality: Right; . PROSTATE SURGERY   . REMOVAL OF IMPELLA LEFT VENTRICULAR ASSIST DEVICE N/A 04/10/2019  Procedure: REMOVAL OF IMPELLA LEFT VENTRICULAR ASSIST DEVICE WITH INSERTION OF RIGHT FEMORAL ARTERIAL LINE;  Surgeon: Ivin Poot, MD;  Location: [place];  Service: Open Heart Surgery;  Laterality: N/A; . RIGHT/LEFT HEART CATH AND CORONARY ANGIOGRAPHY N/A 03/15/2019  Procedure: RIGHT/LEFT HEART CATH AND CORONARY ANGIOGRAPHY;  Surgeon: Burnell Blanks, MD;  Location: [place] [facility];  Service: Cardiovascular;  Laterality: N/A; . TEE WITHOUT CARDIOVERSION N/A 03/16/2019  Procedure: TRANSESOPHAGEAL ECHOCARDIOGRAM (TEE);  Surgeon: Prescott Gum, Collier Salina, MD;  Location: [place];  Service: Open Heart Surgery;  Laterality: N/A; . TRACHEOSTOMY TUBE PLACEMENT N/A 03/27/2019  Procedure: TRACHEOSTOMY placed using Shiley 8DCT Cuffed.;  Surgeon: Wonda Olds, MD;  [facility];  Service: Thoracic;  Laterality: N/A; . TRANSURETHRAL RESECTION OF BLADDER NECK N/A 05/13/2019  Procedure: CYSTOSCOPY CLOT EVACUATION FULGRATION CYSTOGRAM AND INSTILLATION OF FORMALIN;  Surgeon: Lucas Mallow, MD;  Location: [place];  Service: Urology;  Laterality: N/A; HPI: 86 y.o. male with PMH: h/o CAD s/p CABG and clipping of left atrial appendage, who presented with SOB on 2/1. He became hypoxic on 2/2 with pulmonary edema and needed to be intubated on 2/2 and extubated 2/5. Reintubated 2/8.  Pt underwent impella placement and tracheostomy on 2/9. Pt started on CRRT 2/11. Impella removal 2/23. Pt decannulated 3/13.  MBS 3/16 with penetration of  nectar to the vocal folds without sensation.   Subjective: pleasant, a bit sleepy Assessment / Plan / Recommendation CHL IP CLINICAL IMPRESSIONS 05/15/2019 Clinical Impression Pt presents with improved oropharyngeal swallow function marked by functional oral control and better base-of-tongue propulsion, much less residue in pharynx post-swallow with better sensation of its presence. Pt continues to have delays in onset of laryngeal vestibule closure - this led, on one occasion, to moderate aspiration of thin liquids during the swallow.  When pt tucked his chin, duration of laryngeal vestibule closure was longer, allowing thin liquids to pass through pharynx and into UES without penetration nor aspiration.  This posture was repeated multiple times to ensure success.  Recommend advancing diet to dysphagia 2, thin liquids - pt may use straw to allow him to tuck chin in advance of sipping drinks.  RN present for study; results/recs reviewed.  SLP Visit Diagnosis Dysphagia, oropharyngeal phase (R13.12) Attention and concentration deficit following -- Frontal lobe and executive function deficit following -- Impact on safety and function Mild aspiration risk   [facility] IP TREATMENT RECOMMENDATION 05/15/2019 Treatment Recommendations Therapy as outlined in treatment plan below   Prognosis 05/15/2019 Prognosis for Safe Diet Advancement Good Barriers to Reach Goals -- Barriers/Prognosis Comment -- [facility] IP DIET RECOMMENDATION 05/15/2019 SLP Diet Recommendations Dysphagia 2 (Fine chop) solids;Thin liquid Liquid Administration via Straw;Cup Medication Administration Whole meds with puree Compensations Chin tuck;Slow rate Postural Changes --   [facility] IP OTHER RECOMMENDATIONS 05/15/2019 Recommended Consults -- Oral Care Recommendations Oral care BID Other Recommendations --   [facility] IP FOLLOW UP RECOMMENDATIONS 05/15/2019 Follow up Recommendations Skilled Nursing facility   [facility] IP FREQUENCY AND DURATION 05/15/2019 Speech Therapy Frequency (ACUTE  ONLY) min 3x week Treatment Duration 2 weeks      [facility] IP ORAL PHASE 05/15/2019 Oral Phase WFL Oral - Pudding Teaspoon -- Oral - Pudding Cup -- Oral - Honey Teaspoon -- Oral - Honey Cup -- Oral - Nectar Teaspoon -- Oral - Nectar Cup -- Oral - Nectar Straw -- Oral - Thin Teaspoon -- Oral - Thin Cup -- Oral - Thin Straw -- Oral - Puree -- Oral - Mech Soft -- Oral - Regular -- Oral - Multi-Consistency -- Oral - Pill -- Oral Phase - Comment --  [facility] IP PHARYNGEAL PHASE 05/15/2019 Pharyngeal Phase Impaired Pharyngeal- Pudding Teaspoon -- Pharyngeal -- Pharyngeal- Pudding Cup -- Pharyngeal -- Pharyngeal- Honey Teaspoon -- Pharyngeal -- Pharyngeal- Honey Cup NT Pharyngeal -- Pharyngeal- Nectar Teaspoon -- Pharyngeal -- Pharyngeal- Nectar Cup NT Pharyngeal -- Pharyngeal- Nectar Straw NT Pharyngeal -- Pharyngeal- Thin Teaspoon -- Pharyngeal -- Pharyngeal- Thin Cup NT Pharyngeal -- Pharyngeal- Thin Straw Delayed swallow initiation-pyriform sinuses;Delayed swallow initiation-vallecula;Penetration/Aspiration during swallow;Moderate aspiration Pharyngeal Material enters airway, passes BELOW cords and not ejected out despite cough attempt by patient Pharyngeal- Puree Delayed swallow initiation-vallecula;Pharyngeal residue - valleculae Pharyngeal Material does not enter airway Pharyngeal- Mechanical Soft -- Pharyngeal -- Pharyngeal- Regular -- Pharyngeal -- Pharyngeal-  Multi-consistency -- Pharyngeal -- Pharyngeal- Pill -- Pharyngeal -- Pharyngeal Comment --  [facility] IP CERVICAL ESOPHAGEAL PHASE 05/01/2019 Cervical Esophageal Phase WFL Pudding Teaspoon -- Pudding Cup -- Honey Teaspoon -- Honey Cup -- Nectar Teaspoon -- Nectar Cup -- Nectar Straw -- Thin Teaspoon -- Thin Cup -- Thin Straw -- Puree -- Mechanical Soft -- Regular -- Multi-consistency -- Pill -- Cervical Esophageal Comment -- Juan Quam Laurice 05/15/2019, 2:48 PM                        Medical Problem List and Plan: 1.  Debility secondary to CAD status post CABG  03/15/2019.  Impella device removed 04/10/2019.  Sternal precautions             -patient may not shower due to L IJ for HD- unless way to cover             -ELOS/Goals: 2-2.5 weeks minimum- goals Supervision to CGA 2.  Antithrombotics: -DVT/anticoagulation: SCDs- due to hematuria             -antiplatelet therapy: Aspirin 81 mg daily 3. Pain Management: Oxycodone as needed 4. Mood: Provide emotional support             -antipsychotic agents: N/A 5. Neuropsych: This patient is capable of making decisions on his own behalf. 6. Skin/Wound Care: Routine skin checks 7. Fluids/Electrolytes/Nutrition: Routine in and outs with follow-up chemistries 8.  Acute systolic congestive heart failure/cardiogenic shock.  Follow-up cardiology services 9.  Acute hypoxic respiratory failure with Pseudomonas pneumonia.  Status post tracheostomy 03/27/2019.  Decannulated 04/28/2019. 10.  Acute on chronic anemia/thrombocytopenia.  Follow-up CBC.   Serotonin release assay pending.  No more heparin for HD for now 11.  Dysphagia.  Dysphagia #2 thin liquids.  Follow-up speech therapy.  Nasogastric tube/cortrak for nutritional support.  Check calorie counts.  Dietary follow-up- see dietary note dated 4/6 for additional information. 12.  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  Patient has been transitioned to dialysis and being sent to OP HD after CIR, so is chronic and followed by renal services.  Tunneled L IJ catheter placed 04/24/2019 13.  Hematuria.  Cystoscopy completed with evacuation of clot.  No further bleeding episodes per notes, but has hematuria today- dark wine color hematuria- if doesn't stop, will call Urology back.  14.  Left pleural effusion.  Status post Pleurx catheter 04/23/2019.PATIENT WILL KEEP PLEUREX CATHETER FOR NOW PER CVTS.  Currently being drained Mondays and Thursdays 15.  Diffuse drug rash versus contact dermatitis.  Appears to be resolving.  Steroid taper as directed and completed- itching improved-  con't supportive care. 16.  Rectal bleeding.  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  Follow-up GI with conservative care no further bleeding reported. 17.  PAF.  Patient remains normal sinus rhythm.  Currently off amiodarone.  No plan to resume any anticoagulation at this time until hematuria fully resolved.     Lavon Paganini Angiulli, PA-C 05/17/2019    I have personally performed a face to face diagnostic evaluation of this patient and formulated the key components of the plan.  Additionally, I have personally reviewed laboratory data, imaging studies, as well as relevant notes and concur with the physician assistant's documentation above.   The patient's status has not changed from the original H&P.  Any changes in documentation from the acute care chart have been noted above.

## 2019-05-22 NOTE — Progress Notes (Signed)
Renal Navigator sees that patient will be admitted to CIR today and provided an update to OP HD clinic/[facility]. Navigator will obtain patient's OP HD schedule prior to discharge from CIR.  Alphonzo Cruise,  Renal Navigator [phone]

## 2019-05-22 NOTE — PMR Pre-admission (Signed)
PMR Admission Coordinator Pre-Admission Assessment  Patient: Jesse Murphy is an 86 y.o., male MRN: [id number] DOB: May 26, 1928 Height: 5' 7.99" (172.7 cm) Weight: 83.3 kg  Insurance Information HMO:     PPO:      PCP:      IPA:      80/20:      OTHER:  PRIMARY: Medicare A and B      Policy#: [id number]      Subscriber: pt CM Name:       Phone#:      Fax#:  Pre-Cert#: verified Civil engineer, contracting:  Benefits:  Phone #:      Name:  Eff. Date: A and B 01/16/96     Deduct: $1484      Out of Pocket Max: n/a      Life Max: n/a CIR: 100%      SNF: 20 full days Outpatient: 80%     Co-Pay: 20% Home Health: 100%      Co-Pay:  DME: 80%     Co-Pay: 20% Providers: pt choice   SECONDARY: Tricare for Life      Policy#: [id number]      Subscriber: pt CM Name:       Phone#:      Fax#:  Pre-Cert#:       Employer:  Benefits:  Phone #: [phone]     Name:  Eff. Date:      Deduct:       Out of Pocket Max:       Life Max:  CIR:       SNF:  Outpatient:      Co-Pay:  Home Health:       Co-Pay:  DME:      Co-Pay:   Medicaid Application Date:       Case Manager:  Disability Application Date:       Case Worker:   The "Data Collection Information Summary" for patients in Inpatient Rehabilitation Facilities with attached "Privacy Act [place] Records" was provided and verbally reviewed with: Patient and Family  Emergency Contact Information Contact Information    Name Relation Home Work Mobile   Jesse Murphy Spouse [phone]     Jesse Murphy Daughter [phone]  [phone]   Jesse Murphy [phone]  [phone]      Current Medical History  Patient Admitting Diagnosis: cardiac debility   History of Present Illness: Jesse Murphy is an 86 year old right-handed male with history of CAD, chronic atrial fibrillation on Xarelto as well as non-Hodgkin's lymphoma, chronic anemia, hypertension, diastolic congestive heart failure, prostate cancer.   Presented to [facility] 03/13/2019 with tachycardia as well as vague associated chest discomfort and palpitations.  Initially heart rate 200 bpm was given intravenous Cardizem.  Initial labs hemoglobin 11.3, platelets 187,000, sodium 138, potassium 3.9, creatinine 0.96 initial at bedtime troponin 32 with repeat values of 440, 926 and 585.  Chest x-ray showed no evidence of consolidation or effusion.  EKG showed atrial fibrillation with RVR heart rate 145 with no left bundle branch block.  DCCV was attempted in the ED but he remained in atrial fibrillation.  Echocardiogram showed ejection fraction of 35% as compared to previous echoes 55 to 60% 12/2017.  He was transferred to [facility] for further evaluation.  Cardiology service follow-up and patient with cardiac catheterization with LM/LAD and RCA disease.  Patient underwent CABG x3 left atrial Maze procedure 03/17/2019 per Dr. Darcey Nora.  Impella removed 04/10/2019 patient did  require long-term intubation ultimately due to acute hypoxic respiratory failure tracheostomy tube completed 03/27/2019 he was decannulated 04/28/2019 stable nasal cannula.  Patient did have left pleural effusion necessitating need for Pleurx catheter 04/23/2019.  He developed a rash felt to be possibly drug-induced responded well to prednisone taper.  Nephrology consulted 03/29/2019 for AKI with creatinine 4.43 from baseline 1.7.  Patient ultimately required hemodialysis and currently remains on schedule Monday Wednesday Friday.  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  Palliative care has been consulted to establish goals of care.  Urology services consulted 04/04/2019 for urinary retention as well as hemorrhagic cystitis underwent cystogram 05/13/2019 a small to moderate amount of clot was evacuated.  Ongoing bouts of anemia transfused as needed latest hemoglobin 11, platelets 99,000 felt to be  induced by heparin during hemodialysis with HIT screen and serotonin release results pending.  Patient would have no more heparin for HD until HIT is ruled out.  He currently on a dysphagia #2, thin liquid diet as well as nasogastric tube for nutritional support. Meds to be crushed in puree.  Therapy evaluations completed and patient was recommended for a comprehensive rehab program.    Patient's medical record from [facility] has been reviewed by the rehabilitation admission coordinator and physician.  Past Medical History  Past Medical History:  Diagnosis Date  . Atrial fibrillation (Iron)   . CAD (coronary artery disease)    a. s/p PCI in 1992 b. Coronary CT in 01/2018 showing extensive coronary calcification; FFR indeterminate --> medical management pursued at that time as patient not interested in repeat cath  . Cancer [facility])    prostate  . Dyspnea   . Hypertension     Family History   family history includes CAD in his brother and sister; CAD (age of onset: 61) in his mother; CAD (age of onset: 21) in his father.  Prior Rehab/Hospitalizations Has the patient had prior rehab or hospitalizations prior to admission? Yes  Has the patient had major surgery during 100 days prior to admission? Yes   Current Medications  Current Facility-Administered Medications:  .  0.9 %  sodium chloride infusion (Manually program via Guardrails IV Fluids), , Intravenous, Once, Rexene Alberts, MD .  0.9 %  sodium chloride infusion, 100 mL, Intravenous, PRN, Irine Seal, MD .  0.9 %  sodium chloride infusion, 100 mL, Intravenous, PRN, Irine Seal, MD .  0.9 %  sodium chloride infusion, , Intravenous, Continuous, Irine Seal, MD, Stopped at 05/08/19 1833 .  oxyCODONE (ROXICODONE) 5 MG/5ML solution 5 mg, 5 mg, Per Tube, Q4H PRN, 5 mg at 05/13/19 2309 **AND** acetaminophen (TYLENOL) 160 MG/5ML solution 325 mg, 325 mg, Per Tube, Q4H PRN, Prescott Gum, Collier Salina, MD, 325 mg at 05/12/19 0035 .   anticoagulant sodium citrate solution 5 mL, 5 mL, Intracatheter, Q M,W,F-HD, Prescott Gum, Peter, MD, 5 mL at 05/19/19 1511 .  anticoagulant sodium citrate solution 5 mL, 5 mL, Intracatheter, Once, Elmarie Shiley, MD .  aspirin chewable tablet 81 mg, 81 mg, Oral, Daily, Prescott Gum, Collier Salina, MD, 81 mg at 05/22/19 1201 .  calcium acetate (Phos Binder) (PHOSLYRA) 667 MG/5ML oral solution 667 mg, 667 mg, Per Tube, TID WC, Prescott Gum, Collier Salina, MD, 667 mg at 05/20/19 1725 .  chlorhexidine (PERIDEX) 0.12 % solution 15 mL, 15 mL, Mouth Rinse, BID, Irine Seal, MD, 15 mL at 05/21/19 2158 .  Chlorhexidine Gluconate Cloth 2 %  PADS 6 each, 6 each, Topical, Daily, Prescott Gum, Collier Salina, MD, 6 each at 05/22/19 1051 .  Chlorhexidine Gluconate Cloth 2 % PADS 6 each, 6 each, Topical, Q0600, Rosita Fire, MD, 6 each at 05/22/19 [phone] .  dextrose 50 % solution 0-50 mL, 0-50 mL, Intravenous, PRN, Irine Seal, MD .  diphenhydrAMINE (BENADRYL) capsule 25 mg, 25 mg, Oral, Q6H PRN, Melrose Nakayama, MD .  famotidine (PEPCID) tablet 10 mg, 10 mg, Per Tube, Daily, Donnamae Jude, RPH, 10 mg at 05/22/19 1202 .  feeding supplement (NEPRO CARB STEADY) liquid 1,000 mL, 1,000 mL, Oral, Continuous, Prescott Gum, Collier Salina, MD, Last Rate: 40 mL/hr at 05/22/19 0800, Rate Verify at 05/22/19 0800 .  feeding supplement (PRO-STAT SUGAR FREE 64) liquid 60 mL, 60 mL, Per Tube, TID, Prescott Gum, Collier Salina, MD, 60 mL at 05/21/19 2158 .  Gerhardt's butt cream, , Topical, BID, Irine Seal, MD, Given at 05/22/19 1052 .  hydrocortisone (ANUSOL-HC) 2.5 % rectal cream, , Rectal, BID, Irine Seal, MD, Given at 05/20/19 2039 .  hydrocortisone (ANUSOL-HC) suppository 25 mg, 25 mg, Rectal, BID, Prescott Gum, Collier Salina, MD, 25 mg at 05/21/19 2217 .  hydrocortisone cream 1 %, , Topical, BID, Melrose Nakayama, MD, Given at 05/22/19 1053 .  hydrOXYzine (ATARAX) 10 MG/5ML syrup 10 mg, 10 mg, Per Tube, Q6H PRN, Bensimhon, Shaune Pascal, MD .  insulin aspart (novoLOG) injection 0-15  Units, 0-15 Units, Subcutaneous, TID WC, Ivin Poot, MD, 2 Units at 05/22/19 [phone] .  insulin aspart (novoLOG) injection 0-5 Units, 0-5 Units, Subcutaneous, QHS, Prescott Gum, Collier Salina, MD, 2 Units at 05/10/19 2341 .  lidocaine (PF) (XYLOCAINE) 1 % injection 5 mL, 5 mL, Intradermal, PRN, Irine Seal, MD .  lidocaine-prilocaine (EMLA) cream 1 application, 1 application, Topical, PRN, Irine Seal, MD .  loperamide HCl (IMODIUM) 1 MG/7.5ML suspension 4 mg, 4 mg, Per Tube, PRN, Ivin Poot, MD .  magic mouthwash, 5 mL, Oral, TID, Prescott Gum, Collier Salina, MD, 5 mL at 05/20/19 2038 .  MEDLINE mouth rinse, 15 mL, Mouth Rinse, q12n4p, Irine Seal, MD, 15 mL at 05/20/19 1300 .  multivitamin (RENA-VIT) tablet 1 tablet, 1 tablet, Per Tube, QHS, Prescott Gum, Collier Salina, MD, 1 tablet at 05/21/19 2158 .  ondansetron (ZOFRAN) injection 4 mg, 4 mg, Intravenous, Q6H PRN, Irine Seal, MD, 4 mg at 05/22/19 1048 .  pentafluoroprop-tetrafluoroeth (GEBAUERS) aerosol 1 application, 1 application, Topical, PRN, Irine Seal, MD .  promethazine (PHENERGAN) injection 6.25 mg, 6.25 mg, Intravenous, Q6H PRN, Irine Seal, MD, 6.25 mg at 05/19/19 1307 .  Resource ThickenUp Clear, , Oral, PRN, Irine Seal, MD, Given at 05/02/19 [phone] .  simethicone (MYLICON) chewable tablet 80 mg, 80 mg, Oral, TID, Prescott Gum, Collier Salina, MD, 80 mg at 05/22/19 1202 .  sodium chloride (OCEAN) 0.65 % nasal spray 1 spray, 1 spray, Each Nare, PRN, Gold, Wayne E, PA-C .  sodium chloride flush (NS) 0.9 % injection 10-40 mL, 10-40 mL, Intracatheter, Q12H, Prescott Gum, Collier Salina, MD, 10 mL at 05/22/19 1054 .  sodium chloride flush (NS) 0.9 % injection 10-40 mL, 10-40 mL, Intracatheter, PRN, Prescott Gum, Collier Salina, MD, 10 mL at 05/15/19 0911 .  sodium chloride flush (NS) 0.9 % injection 3 mL, 3 mL, Intravenous, PRN, Irine Seal, MD, 3 mL at 04/16/19 0954  Patients Current Diet:  Diet Order            DIET DYS 2 Room service appropriate? No; Fluid consistency: Thin  Diet effective  now  Precautions / Restrictions Precautions Precautions: Fall, Sternal Precaution Booklet Issued: No Precaution Comments: slightly orthostatic Restrictions Weight Bearing Restrictions: Yes RUE Weight Bearing: Partial weight bearing RUE Partial Weight Bearing Percentage or Pounds: Can place ~20lbs through his UEs for mobility per MD Other Position/Activity Restrictions: sternal precautions   Has the patient had 2 or more falls or a fall with injury in the past year? Yes  Prior Activity Level Limited Community (1-2x/wk): independent, living with spouse, no AD prior to admission  Prior Functional Level Self Care: Did the patient need help bathing, dressing, using the toilet or eating? Independent  Indoor Mobility: Did the patient need assistance with walking from room to room (with or without device)? Independent  Stairs: Did the patient need assistance with internal or external stairs (with or without device)? Independent  Functional Cognition: Did the patient need help planning regular tasks such as shopping or remembering to take medications? Independent  Home Assistive Devices / Equipment Home Assistive Devices/Equipment: None Home Equipment: Other (comment)  Prior Device Use: Indicate devices/aids used by the patient prior to current illness, exacerbation or injury? None of the above  Current Functional Level Cognition  Overall Cognitive Status: No family/caregiver present to determine baseline cognitive functioning Difficult to assess due to: Tracheostomy Orientation Level: Oriented X4 Following Commands: Follows multi-step commands with increased time General Comments: Pt following all commands today.    Extremity Assessment (includes Sensation/Coordination)  Upper Extremity Assessment: Generalized weakness  Lower Extremity Assessment: Generalized weakness    ADLs  Overall ADL's : Needs assistance/impaired Eating/Feeding: Set up, Sitting Grooming:  Set up, Sitting Grooming Details (indicate cue type and reason): Pt denying need to stand for task after mobility task Upper Body Bathing: Moderate assistance, Sitting Lower Body Bathing: Maximal assistance, Sit to/from stand Lower Body Bathing Details (indicate cue type and reason): to wash legs after incontinence episode Upper Body Dressing : Moderate assistance, Sitting Lower Body Dressing: Maximal assistance, Sit to/from stand Toilet Transfer: Minimal assistance, +2 for physical assistance, +2 for safety/equipment, RW Toilet Transfer Details (indicate cue type and reason): to [facility] after incontinence episode Toileting- Clothing Manipulation and Hygiene: Maximal assistance, Sit to/from stand Functional mobility during ADLs: Minimal assistance, +2 for physical assistance, Rolling walker General ADL Comments: Pt stand pivotting to recliner today. Set-upA for light grooming, but pt denied having energy for task.    Mobility  Overal bed mobility: Needs Assistance Bed Mobility: Supine to Sit Rolling: Min assist Supine to sit: Min assist Sit to supine: Min assist Sit to sidelying: Min assist General bed mobility comments: UE support for trunk elevation and scooting hips to EOB, to supine assist for positioning, adjusting hips and shoulders    Transfers  Overall transfer level: Needs assistance Equipment used: Rolling walker (2 wheeled) Transfer via Lift Equipment: Stedy Transfers: Sit to/from [employer] to Stand: Min assist, From elevated surface General transfer comment: for balance from slightly elevated bed    Ambulation / Gait / Stairs / Wheelchair Mobility  Ambulation/Gait Ambulation/Gait assistance: Herbalist (Feet): 2 Feet Assistive device: Rolling walker (2 wheeled) Gait Pattern/deviations: Step-to pattern, Shuffle General Gait Details: side steps to [facility] only, pt worried about energy due to supposed to go to HD today Gait velocity: decr Gait velocity  interpretation: <1.31 ft/sec, indicative of household ambulator    Posture / Balance Dynamic Sitting Balance Sitting balance - Comments: Min guard-Min A for EOB balance. Balance Overall balance assessment: Needs assistance Sitting-balance support: Feet supported Sitting balance-Leahy Scale: Good Sitting  balance - Comments: Min guard-Min A for EOB balance. Postural control: Posterior lean Standing balance support: Bilateral upper extremity supported, During functional activity Standing balance-Leahy Scale: Poor Standing balance comment: Reliant on UE support and external assist    Special needs/care consideration BiPAP/CPAP no CPM no Continuous Drip IV no Dialysis yes        Days TTS Life Vest no Oxygen 1L/min, Nasal Cannula Special Bed no Trach Size no Wound Vac (area) no      Location n/a Skin incision to RLE and perineum; Stage 2 pressure injury to B ears and L buttocks, Deep Tissue Pressure injury to R heel                           Bowel mgmt: continent Bladder mgmt: purwick  Diabetic mgmt: yes Behavioral consideration no Chemo/radiation no   Previous Home Environment (from acute therapy documentation) Living Arrangements: Spouse/significant other Available Help at Discharge: Family, Available 24 hours/day Type of Home: House Home Layout: One level Home Access: Stairs to enter Technical brewer of Steps: [place]: No Additional Comments: lift chair  Discharge Living Setting Plans for Discharge Living Setting: Patient's home Type of Home at Discharge: House Discharge Home Layout: One level Discharge Home Access: Stairs to enter Entrance Stairs-Rails: None Entrance Stairs-Number of Steps: 4 Discharge Bathroom Shower/Tub: Tub/shower unit Discharge Bathroom Toilet: Standard Discharge Bathroom Accessibility: Yes How Accessible: Accessible via walker Does the patient have any problems obtaining your medications?: No  Social/Family/Support  Systems Patient Roles: Spouse Contact Information: [name] [phone] Anticipated Caregiver: Son Timmothy Sours), daughter Jackelyn Poling) Anticipated Caregiver's Contact Information: Timmothy Sours [phone] Ability/Limitations of Caregiver: supervision/min assist Caregiver Availability: 24/7 Discharge Plan Discussed with Primary Caregiver: Yes Is Caregiver In Agreement with Plan?: Yes Does Caregiver/Family have Issues with Lodging/Transportation while Pt is in Rehab?: No  Goals/Additional Needs Patient/Family Goal for Rehab: PT/OT supervision to mod I, SLP mod I Expected length of stay: 7-10 days Dietary Needs: dys 2/thin, meds crushed in puree Equipment Needs: tbd Special Service Needs: HD TRS Additional Information: needs to be CLIP'd, I have notified Terri Piedra Pt/Family Agrees to Admission and willing to participate: Yes Program Orientation Provided & Reviewed with Pt/Caregiver Including Roles  & Responsibilities: Yes  Barriers to Discharge: Medical stability, Hemodialysis  Decrease burden of Care through IP rehab admission: n/a  Possible need for SNF placement upon discharge: Not anticipated. Pt with good functional progress while on acute, and with good family support at discharge.   Patient Condition: I have reviewed medical records from [facility], spoken with CM, and patient and son. I met with patient at the bedside for inpatient rehabilitation assessment.  Patient will benefit from ongoing PT, OT and SLP, can actively participate in 3 hours of therapy a day 5 days of the week, and can make measurable gains during the admission.  Patient will also benefit from the coordinated team approach during an Inpatient Acute Rehabilitation admission.  The patient will receive intensive therapy as well as Rehabilitation physician, nursing, social worker, and care management interventions.  Due to bladder management, safety, skin/wound care, disease management, medication administration, pain  management and patient education the patient requires 24 hour a day rehabilitation nursing.  The patient is currently min to mod assist with mobility and basic ADLs.  Discharge setting and therapy post discharge at home with home health is anticipated.  Patient has agreed to participate in the Acute Inpatient Rehabilitation Program and will admit today.  Preadmission Screen Completed By:  Michel Santee, PT, DPT 05/22/2019 1:08 PM ______________________________________________________________________   Discussed status with Dr. Dagoberto Ligas on 05/22/19  at 1:08 PM  and received approval for admission today.  Admission Coordinator:  Michel Santee, PT, DPT time 1:08 PM Sudie Grumbling 05/22/19    Assessment/Plan: Diagnosis: 1. Does the need for close, 24 hr/day Medical supervision in concert with the patient's rehab needs make it unreasonable for this patient to be served in a less intensive setting? Yes 2. Co-Morbidities requiring supervision/potential complications: CAD s/p CABG, new ESRD, L pleural effusion s/p pleurex, dysphagia- has Cor-trak; hematuria, bradycardia/heart block 3. Due to bladder management, bowel management, safety, skin/wound care, disease management, medication administration, pain management and patient education, does the patient require 24 hr/day rehab nursing? Yes 4. Does the patient require coordinated care of a physician, rehab nurse, PT, OT, and SLP to address physical and functional deficits in the context of the above medical diagnosis(es)? Yes Addressing deficits in the following areas: balance, endurance, locomotion, strength, transferring, bathing, dressing, feeding, grooming, toileting, cognition, swallowing and psychosocial support 5. Can the patient actively participate in an intensive therapy program of at least 3 hrs of therapy 5 days a week? Yes 6. The potential for patient to make measurable gains while on inpatient rehab is fair 7. Anticipated functional outcomes upon  discharge from inpatient rehab: modified independent and supervision PT, modified independent and supervision OT, modified independent and supervision SLP 8. Estimated rehab length of stay to reach the above functional goals is: 7-10 days 9. Anticipated discharge destination: Home 10. Overall Rehab/Functional Prognosis: fair   MD Signature:

## 2019-05-22 NOTE — Progress Notes (Signed)
HD cath Issues; not able to above BFR 200; reversed the HD cath not change; frequent machine stoppage d/t continued APs increases. Called Dr. Donato Heinz and he ordered TPA for 30 min and retry.

## 2019-05-22 NOTE — Progress Notes (Signed)
Nutrition Follow up   DOCUMENTATION CODES:   Not applicable  INTERVENTION:  Pt day 65 with [name]. Poor candidate for PEG per TCTS.   Tube feeding:  -Nepro @ 45 ml/hr (1080 ml) via [name] -60 ml Prostat BID  Provides: 2344 kcals, 147 grams protein, 785 ml free water.   NUTRITION DIAGNOSIS:   Increased nutrient needs related to post-op healing as evidenced by estimated needs.  Ongoing  GOAL:   Patient will meet greater than or equal to 90% of their needs   Addressed via tube feeding   MONITOR:   Weight trends, Diet advancement, Vent status, Skin, TF tolerance, Labs, I & O's  REASON FOR ASSESSMENT:   Consult Enteral/tube feeding initiation and management  ASSESSMENT:   Patient with PMH significant for CAD s/p PCI, prostate cancer, HTN, CHF, and DM. Presents this admission with chronic a.fib with RVR.   1/28- s/p L heart cath  1/30- s/p CABG x3, MAZE, extubated  2/2- re-intubated  2/5- extubated 2/8- re-intubated  2/10- s/p impella, trach, NGT placed-start trickle feeding 2/11- start CRRT 2/23- removal of impella, [name] dislodged  2/24- CRRT stop 3/1- failed iHD, back on CRRT 3/2- s/p [name], nocturnal feedings started 3/4- diet advanced DYS2 , nectar thick  3/8- s/p L PleurX catheter and L TDC 3/10- tolerated iHD 3/13- s/p decannulation  3/15- s/p cystoscopy  3/17- [name] clogged, s/p replacement   RD working remotely.  Pt refusing bolus feedings. Refusing medications including binder therapy due to ongoing nausea. Per RN, when pt takes anything PO he almost immediatly vomits. Unsure if delayed gastric emptying/fullness is causing these symptoms. Consider d/c [name] and pursuing comfort feedings if long term feeding tube is not an option.   Tube feeding changed by TCTS back to continuous. Tolerating okay. RD to make adjustments to better meet needs.  Admission weight: 89.3 kg  Current weight: 83.3 kg  Medications: phoslyra, SS novolog,  rena-vit Labs: Na 133 (L) Phosphorus 6.4 (H) corrected calcium 9.5 (wdl) CBG 80-134  Diet Order:   Diet Order            DIET DYS 2 Room service appropriate? No; Fluid consistency: Thin  Diet effective now              EDUCATION NEEDS:   Not appropriate for education at this time  Skin:  Skin Assessment: Skin Integrity Issues: Skin Integrity Issues:: Stage I, DTI, Stage II DTI: chest, R heel Stage I: vertebral column Stage II: buttocks, L ear Incisions: chest, leg  Last BM:  4/5  Height:   Ht Readings from Last 1 Encounters:  05/13/19 5' 7.99" (1.727 m)    Weight:   Wt Readings from Last 1 Encounters:  05/22/19 83.3 kg    Ideal Body Weight:  70 kg  BMI:  Body mass index is 27.93 kg/m.  Estimated Nutritional Needs:   Kcal:  2200-2400 kcal  Protein:  135-155 grams  Fluid:  >/= 2.2 L/day   Mariana Single RD, LDN Clinical Nutrition Pager # - [phone]

## 2019-05-22 NOTE — Progress Notes (Signed)
SLP Cancellation Note  Patient Details Name: Jesse Murphy MRN: [id number] DOB: Feb 13, 1929   Cancelled treatment:   Pt has been experiencing more N/V, not feeling well at the time of my visit.  RN reports good toleration of POs - though eating limited amounts- with recall of chin tuck and no s/s of aspiration.  Pt reports he is remembering to follow-through with precautions.  SLP will continue to follow while admitted pending D/C to CIR.  D/W pt.  Continue dysphagia 2, thin liquids with chin tuck.  Jesse Murphy L. Tivis Ringer, [place] CCC/SLP Acute Rehabilitation Services Office number [phone] Pager [phone]        Jesse Murphy Laurice 05/22/2019, 8:50 AM

## 2019-05-22 NOTE — Progress Notes (Addendum)
Inpatient Rehab Admissions Coordinator:   I have a bed available for pt to admit to CIR today.  Awaiting confirmation from medical team that he is ready.  Addendum: I have approval from Lars Pinks, PA-C for pt to admit to CIR today.  Will let pt/family, and CM know.    Shann Medal, PT, DPT Admissions Coordinator [phone] 05/22/19  10:02 AM

## 2019-05-22 NOTE — Progress Notes (Addendum)
Inpatient Rehabilitation Medication Review by a Pharmacist  A complete drug regimen review was completed for this patient to identify any potential clinically significant medication issues.  Clinically significant medication issues were identified:  yes   Type of Medication Issue Identified Description of Issue Urgent (address now) Non-Urgent (address on AM team rounds) Plan Plan Accepted by Provider? (Yes / No / Pending AM Rounds)  Other  Atorvastatin and metoclopramide were not restarted from hospitalization.  Address on AM team rounds AM pharmacist will follow up and assess if these medications will be needed These medications not restarted due to potential rash reaction.    Pharmacist comments: No changes needed  Time spent performing this drug regimen review (minutes):  [place], PharmD PGY1 Acute Care Pharmacy Resident 05/22/2019 7:48 PM

## 2019-05-22 NOTE — Progress Notes (Addendum)
[facility] NEPHROLOGY PROGRESS NOTE  Assessment/ Plan: Pt is a 86 y.o. yo male with history of CHF, cardiogenic shock requiring Impella support, status post CABG, A. fib, hemorrhagic cystitis followed by urologist, consulted for AKI now dialysis dependent.  #Acute kidney injury likely ATN due to cardiogenic shock/CABG surgery: Now dialysis dependent.  No sign of recovery so far.  Plan for dialysis today, 4 k bath.  Volume status acceptable.  Concern for HIT however the test result was negative.  We will try to avoid heparin.  Needs outpatient HD arrangement for AKI.  #Acute systolic heart failure/cardiogenic shock/status post Impella support which was removed.  Tolerating dialysis so far.  # Anemia due to critical illness/acute blood loss: Hb at goal.  # Secondary hyperparathyroidism: PTH 116. On phoslyra. Monitor phosphorus level.  # HTN/volume: BP and volume status looks acceptable.  UF during dialysis.  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  #Hyperkalemia: Resolved.  #Diffuse maculopapular rash around face and upper body: Concerned for allergic reaction to facial moisturizer, receiving steroid.  Subjective: Seen and examined at bedside.  No nausea, vomiting, chest pain or shortness of breath.  Developed facial rash today. Objective Vital signs in last 24 hours: Vitals:   05/21/19 1937 05/21/19 2359 05/22/19 0412 05/22/19 0735  BP: (!) 118/44 (!) 117/45 (!) 124/43 (!) 119/46  Pulse: 70 73  80  Resp: 12 19  17   Temp: 98.1 F (36.7 C) 97.8 F (36.6 C) 98.1 F (36.7 C) 98.2 F (36.8 C)  TempSrc: Oral Oral Oral Oral  SpO2: 97% 98%  97%  Weight:   83.3 kg   Height:       Weight change: -3.2 kg  Intake/Output Summary (Last 24 hours) at 05/22/2019 0742 Last data filed at 05/22/2019 0600 Gross per 24 hour  Intake 540 ml  Output 650 ml  Net -110 ml       Labs: Basic Metabolic Panel: Recent Labs  Lab  05/20/19 0257 05/21/19 0135 05/22/19 0235  NA 135 132* 133*  K 4.0 4.4 4.2  CL 99 95* 95*  CO2 27 25 22   GLUCOSE 108* 109* 161*  BUN 86* 114* 135*  CREATININE 4.13* 5.37* 6.29*  CALCIUM 8.0* 7.8* 7.9*  PHOS 4.5 5.7* 6.4*   Liver Function Tests: Recent Labs  Lab 05/20/19 0257 05/21/19 0135 05/22/19 0235  ALBUMIN 2.0* 2.0* 2.0*   No results for input(s): LIPASE, AMYLASE in the last 168 hours. No results for input(s): AMMONIA in the last 168 hours. CBC: Recent Labs  Lab 05/18/19 0235 05/18/19 0235 05/19/19 0229 05/19/19 0229 05/20/19 0257 05/21/19 0135 05/22/19 0235  WBC 6.8   < > 6.8   < > 7.1 7.7 8.7  HGB 10.7*   < > 10.9*   < > 11.0* 11.0* 11.0*  HCT 34.4*   < > 35.4*   < > 35.6* 35.1* 35.6*  MCV 92.7  --  93.7  --  92.7 92.4 93.7  PLT 84*   < > 92*   < > 87* 91* 99*   < > = values in this interval not displayed.   Cardiac Enzymes: No results for input(s): CKTOTAL, CKMB, CKMBINDEX, TROPONINI in the last 168 hours. CBG: Recent Labs  Lab 05/21/19 0637 05/21/19 1109 05/21/19 1653 05/21/19 2108 05/22/19 0621  GLUCAP 97 80 88 119* 134*    Iron Studies: No results for input(s): IRON, TIBC, TRANSFERRIN, FERRITIN in the last 72 hours. Studies/Results: No results found.  Medications:  Infusions: . sodium chloride    . sodium chloride    . sodium chloride Stopped (05/08/19 1833)  . anticoagulant sodium citrate    . anticoagulant sodium citrate    . feeding supplement (NEPRO CARB STEADY) 1,000 mL (05/21/19 1627)    Scheduled Medications: . sodium chloride   Intravenous Once  . aspirin  81 mg Oral Daily  . calcium acetate (Phos Binder)  667 mg Per Tube TID WC  . chlorhexidine  15 mL Mouth Rinse BID  . Chlorhexidine Gluconate Cloth  6 each Topical Daily  . Chlorhexidine Gluconate Cloth  6 each Topical Q0600  . famotidine  10 mg Per Tube Daily  . feeding supplement (PRO-STAT SUGAR FREE 64)  60 mL Per Tube TID  . Gerhardt's butt cream   Topical BID  .  hydrocortisone   Rectal BID  . hydrocortisone  25 mg Rectal BID  . hydrocortisone cream   Topical BID  . insulin aspart  0-15 Units Subcutaneous TID WC  . insulin aspart  0-5 Units Subcutaneous QHS  . magic mouthwash  5 mL Oral TID  . mouth rinse  15 mL Mouth Rinse q12n4p  . methylPREDNISolone (SOLU-MEDROL) injection  80 mg Intravenous Once  . multivitamin  1 tablet Per Tube QHS  . simethicone  80 mg Oral TID  . sodium chloride flush  10-40 mL Intracatheter Q12H    have reviewed scheduled and prn medications.  Physical Exam: General:NAD, comfortable, maculopapule rash in face. Heart:RRR, s1s2 nl Lungs:clear b/l, no crackle Abdomen:soft, Non-tender, non-distended Extremities: Trace LE edema Dialysis Access: L IJ TDC for the access.  Dron Prasad Bhandari 05/22/2019,7:42 AM  LOS: 66 days  Pager: [phone]

## 2019-05-23 ENCOUNTER — Inpatient Hospital Stay (HOSPITAL_COMMUNITY): Payer: Medicare Other | Admitting: Speech Pathology

## 2019-05-23 ENCOUNTER — Inpatient Hospital Stay (HOSPITAL_COMMUNITY): Payer: Medicare Other

## 2019-05-23 ENCOUNTER — Inpatient Hospital Stay (HOSPITAL_COMMUNITY): Payer: Medicare Other | Admitting: Occupational Therapy

## 2019-05-23 DIAGNOSIS — L8990 Pressure ulcer of unspecified site, unspecified stage: Secondary | ICD-10-CM

## 2019-05-23 DIAGNOSIS — R5381 Other malaise: Principal | ICD-10-CM

## 2019-05-23 LAB — RENAL FUNCTION PANEL
Albumin: 2.2 g/dL — ABNORMAL LOW (ref 3.5–5.0)
Anion gap: 14 (ref 5–15)
BUN: 147 mg/dL — ABNORMAL HIGH (ref 8–23)
CO2: 23 mmol/L (ref 22–32)
Calcium: 7.8 mg/dL — ABNORMAL LOW (ref 8.9–10.3)
Chloride: 96 mmol/L — ABNORMAL LOW (ref 98–111)
Creatinine, Ser: 6.92 mg/dL — ABNORMAL HIGH (ref 0.61–1.24)
GFR calc Af Amer: 7 mL/min — ABNORMAL LOW (ref >60)
GFR calc non Af Amer: 6 mL/min — ABNORMAL LOW (ref >60)
Glucose, Bld: 168 mg/dL — ABNORMAL HIGH (ref 70–99)
Phosphorus: 5.3 mg/dL — ABNORMAL HIGH (ref 2.5–4.6)
Potassium: 4.2 mmol/L (ref 3.5–5.1)
Sodium: 133 mmol/L — ABNORMAL LOW (ref 135–145)

## 2019-05-23 LAB — CBC
HCT: 33.4 % — ABNORMAL LOW (ref 39.0–52.0)
Hemoglobin: 10.5 g/dL — ABNORMAL LOW (ref 13.0–17.0)
MCH: 29 pg (ref 26.0–34.0)
MCHC: 31.4 g/dL (ref 30.0–36.0)
MCV: 92.3 fL (ref 80.0–100.0)
Platelets: 110 10*3/uL — ABNORMAL LOW (ref 150–400)
RBC: 3.62 MIL/uL — ABNORMAL LOW (ref 4.22–5.81)
RDW: 18.6 % — ABNORMAL HIGH (ref 11.5–15.5)
WBC: 8.9 10*3/uL (ref 4.0–10.5)
nRBC: 0 % (ref 0.0–0.2)

## 2019-05-23 LAB — GLUCOSE, CAPILLARY
Glucose-Capillary: 167 mg/dL — ABNORMAL HIGH (ref 70–99)
Glucose-Capillary: 168 mg/dL — ABNORMAL HIGH (ref 70–99)
Glucose-Capillary: 145 mg/dL — ABNORMAL HIGH (ref 70–99)
Glucose-Capillary: 166 mg/dL — ABNORMAL HIGH (ref 70–99)

## 2019-05-23 MED ORDER — ONDANSETRON HCL 4 MG PO TABS
4.0000 mg | ORAL_TABLET | Freq: Once | ORAL | Status: DC
  Filled 2019-05-23: qty 1

## 2019-05-23 MED ORDER — SODIUM CHLORIDE 0.9 % IV SOLN: 100.0000 mL | INTRAVENOUS | Status: DC | PRN

## 2019-05-23 MED ORDER — ANTICOAGULANT SODIUM CITRATE 4% (200MG/5ML) IV SOLN: 5.0000 mL | Status: DC | PRN

## 2019-05-23 MED ORDER — ALTEPLASE 2 MG IJ SOLR
INTRAMUSCULAR | Status: AC
  Administered 2019-05-23: 3.8 mg
  Filled 2019-05-23: qty 4

## 2019-05-23 MED ORDER — CHLORHEXIDINE GLUCONATE CLOTH 2 % EX PADS
6.0000 | MEDICATED_PAD | Freq: Two times a day (BID) | CUTANEOUS | Status: DC
  Administered 2019-05-23 – 2019-05-27 (×5): 6 via TOPICAL

## 2019-05-23 MED ORDER — LIDOCAINE HCL (PF) 1 % IJ SOLN
5.0000 mL | INTRAMUSCULAR | Status: DC | PRN
  Filled 2019-05-23: qty 5

## 2019-05-23 MED ORDER — NEPRO/CARBSTEADY PO LIQD
1000.0000 mL | ORAL | Status: DC
  Administered 2019-05-23 – 2019-05-26 (×3): 1000 mL
  Filled 2019-05-23 (×7): qty 1000

## 2019-05-23 MED ORDER — PENTAFLUOROPROP-TETRAFLUOROETH EX AERO: 1.0000 "application " | INHALATION_SPRAY | CUTANEOUS | Status: DC | PRN

## 2019-05-23 MED ORDER — LIDOCAINE-PRILOCAINE 2.5-2.5 % EX CREA
1.0000 "application " | TOPICAL_CREAM | CUTANEOUS | Status: DC | PRN
  Filled 2019-05-23: qty 5

## 2019-05-23 MED ORDER — PRO-STAT SUGAR FREE PO LIQD
60.0000 mL | Freq: Two times a day (BID) | ORAL | Status: DC
  Administered 2019-05-24 – 2019-05-28 (×6): 60 mL
  Filled 2019-05-23 (×7): qty 60

## 2019-05-23 MED ORDER — FREE WATER
245.0000 mL | Status: DC
  Administered 2019-05-23 – 2019-05-25 (×11): 245 mL

## 2019-05-23 MED ORDER — CHLORHEXIDINE GLUCONATE CLOTH 2 % EX PADS: 6.0000 | MEDICATED_PAD | Freq: Every day | CUTANEOUS | Status: DC

## 2019-05-23 MED ORDER — CAMPHOR-MENTHOL 0.5-0.5 % EX LOTN
TOPICAL_LOTION | CUTANEOUS | Status: DC | PRN
  Filled 2019-05-23: qty 222

## 2019-05-23 MED ORDER — NEPRO/CARBSTEADY PO LIQD
1000.0000 mL | ORAL | Status: DC
  Filled 2019-05-23: qty 1000

## 2019-05-23 MED ORDER — ONDANSETRON HCL 4 MG/5ML PO SOLN
4.0000 mg | Freq: Three times a day (TID) | ORAL | Status: DC | PRN
  Administered 2019-05-23 – 2019-05-25 (×3): 4 mg via ORAL
  Filled 2019-05-23 (×5): qty 5

## 2019-05-23 MED ORDER — ALTEPLASE 2 MG IJ SOLR: 2.0000 mg | Freq: Once | INTRAMUSCULAR | Status: AC | PRN

## 2019-05-23 NOTE — Evaluation (Signed)
Speech Language Pathology Assessment and Plan  Patient Details  Name: Jesse Murphy MRN: [id number] Date of Birth: 21-Dec-1928  SLP Diagnosis: Dysphagia;Cognitive Impairments  Rehab Potential: Good ELOS: 10-14 days    Today's Date: 05/23/2019 SLP Individual Time: 1305-1400 SLP Individual Time Calculation (min): 55 min   Problem List:  Patient Active Problem List   Diagnosis Date Noted  . Pressure ulcer 05/23/2019  . Debility 05/22/2019  . AKI (acute kidney injury) ([place])   . Palliative care encounter   . Pressure injury of skin 03/25/2019  . Acute respiratory failure with hypoxia ([place])   . S/P CABG x 3 03/16/2019  . Non-ST elevation (NSTEMI) myocardial infarction ([place])   . Acute on chronic combined systolic and diastolic CHF (congestive heart failure) ([place])   . Coronary artery disease due to lipid rich plaque   . Atrial fibrillation with RVR ([place]) 03/14/2019  . Atrial fibrillation with rapid ventricular response ([place]) 03/13/2019  . Chest pain 09/12/2018  . Chronic diastolic heart failure ([place]) 05/29/2018  . Hypertension 05/29/2018  . Non Hodgkin's lymphoma ([place]) 09/30/2017  . Heme positive stool 08/11/2017  . Chronic anticoagulation 07/05/2016  . CAD S/P percutaneous coronary angioplasty 07/05/2016  . Type 2 diabetes mellitus without complications ([place]) 42/87/6811  . PAF (paroxysmal atrial fibrillation) ([place]) 06/14/2016  . Chest pain in adult 06/14/2016  . Dyspnea on exertion 06/14/2016  . Allergic rhinitis 06/14/2016  . GERD (gastroesophageal reflux disease) 06/14/2016  . BPH (benign prostatic hyperplasia) 06/14/2016   Past Medical History:  Past Medical History:  Diagnosis Date  . Atrial fibrillation ([place])   . CAD (coronary artery disease)    a. s/p PCI in 1992 b. Coronary CT in 01/2018 showing extensive coronary calcification; FFR indeterminate --> medical management pursued at that time as patient not interested in repeat cath  . Cancer [facility])    prostate  .  Dyspnea   . Hypertension    Past Surgical History:  Past Surgical History:  Procedure Laterality Date  . CHEST TUBE INSERTION Left 04/23/2019   Procedure: INSERTION PLEURAL DRAINAGE CATHETER TO DRAIN LEFT PLEURAL EFFUSION;  Surgeon: Ivin Poot, MD;  Location: [facility];  Service: Thoracic;  Laterality: Left;  . CLIPPING OF ATRIAL APPENDAGE N/A 03/16/2019   Procedure: CLIPPING OF LEFT  ATRIAL APPENDAGE - USING ATRICLIP SIZE 40;  Surgeon: Ivin Poot, MD;  Location: [facility];  Service: Open Heart Surgery;  Laterality: N/A;  . COLONOSCOPY N/A 08/25/2017   Procedure: COLONOSCOPY;  Surgeon: Rogene Houston, MD;  [facility];  Service: Endoscopy;  Laterality: N/A;  . CORONARY ARTERY BYPASS GRAFT N/A 03/16/2019   Procedure: CORONARY ARTERY BYPASS GRAFTING (CABG), ON PUMP, TIMES THREE, USING LEFT INTERNAL MAMMARY ARTERY, RIGHT GREATER SAPHENOUS VEIN HARVESTED ENDOSCOPICALLY;  Surgeon: Ivin Poot, MD;  Location: [facility];  Service: Open Heart Surgery;  Laterality: N/A;  swan only  . CYSTOSCOPY WITH FULGERATION N/A 04/30/2019   Procedure: CYSTOSCOPY WITH FULGERATION;  Surgeon: Irine Seal, MD;  Location: [facility];  Service: Urology;  Laterality: N/A;  . HERNIA REPAIR    . IR FLUORO GUIDE CV LINE LEFT  04/23/2019  . IR US GUIDE VASC ACCESS LEFT  04/23/2019  . MAZE N/A 03/16/2019   Procedure: MAZE;  Surgeon: Ivin Poot, MD;  Location: [facility];  Service: Open Heart Surgery;  Laterality: N/A;  . PLACEMENT OF IMPELLA LEFT VENTRICULAR ASSIST DEVICE Right 03/27/2019   Procedure: Placement Of Impella Left Ventricular Assist Device using ABIOMED Impella  5.5 with SmartAssist Device.;  Surgeon: Wonda Olds, MD;  [facility];  Service: Thoracic;  Laterality: Right;  . PROSTATE SURGERY    . REMOVAL OF IMPELLA LEFT VENTRICULAR ASSIST DEVICE N/A 04/10/2019   Procedure: REMOVAL OF IMPELLA LEFT VENTRICULAR ASSIST DEVICE WITH INSERTION OF RIGHT FEMORAL ARTERIAL LINE;  Surgeon: Ivin Poot, MD;   Location: [facility];  Service: Open Heart Surgery;  Laterality: N/A;  . RIGHT/LEFT HEART CATH AND CORONARY ANGIOGRAPHY N/A 03/15/2019   Procedure: RIGHT/LEFT HEART CATH AND CORONARY ANGIOGRAPHY;  Surgeon: Burnell Blanks, MD;  Location: [facility];  Service: Cardiovascular;  Laterality: N/A;  . TEE WITHOUT CARDIOVERSION N/A 03/16/2019   Procedure: TRANSESOPHAGEAL ECHOCARDIOGRAM (TEE);  Surgeon: [place] Gum, Collier Salina, MD;  Location: [facility];  Service: Open Heart Surgery;  Laterality: N/A;  . TRACHEOSTOMY TUBE PLACEMENT N/A 03/27/2019   Procedure: TRACHEOSTOMY placed using Shiley 8DCT Cuffed.;  Surgeon: Wonda Olds, MD;  [facility];  Service: Thoracic;  Laterality: N/A;  . TRANSURETHRAL RESECTION OF BLADDER NECK N/A 05/13/2019   Procedure: CYSTOSCOPY CLOT EVACUATION FULGRATION CYSTOGRAM AND INSTILLATION OF FORMALIN;  Surgeon: Lucas Mallow, MD;  Location: [facility];  Service: Urology;  Laterality: N/A;    Assessment / Plan / Recommendation Clinical Impression   Jesse Murphy an 86 year old right-handed male with history of CAD, chronic atrial fibrillation on Xarelto as well as non-Hodgkin's lymphoma, chronic anemia, hypertension, diastolic congestive heart failure, prostate cancer.Per chart review patient lives with spouse in [place]. Independent prior to admission. Wife uses a cane. Local children check on him as needed.Presented to [facility]13/2019 with tachycardia as well as vague associated chest discomfort and palpitations. Initially heart rate 200 bpm was given intravenous Cardizem. Initial labs hemoglobin 11.3, platelets 187,000, sodium 138, potassium 3.9, creatinine 0.96 initial at bedtime troponin 32 with repeat values of 440, 926 and 585. Chest x-ray showed no evidence of consolidation or effusion. EKG showed atrial fibrillation with RVR heart rate 145 with no left bundle branch block. DCCV was attempted in the ED but he remained in atrial  fibrillation. Echocardiogram showed ejection fraction of 35% as compared to previous echoes 55 to 60% 12/2017. He was transferred to [facility] for further evaluation. Cardiology service follow-up and patient with cardiac catheterization with LM/LAD and RCA disease. Patient underwent CABG x3 left atrial Maze procedure 03/17/2019 per Dr. Darcey Nora. Impella removed 04/10/2019 patient did require long-term intubation ultimately due to acute hypoxic respiratory failure tracheostomy tube completed 03/27/2019 he was decannulated 04/28/2019 stable nasal cannula. Patient did have left pleural effusion necessitating need for Pleurx catheter 04/23/2019. He developed a rash felt to be possibly drug-induced responded well to prednisone taper. Nephrology consulted 03/29/2019 for AKI with creatinine 4.28fom baseline 1.7. Patient ultimately required hemodialysis and currently remains on schedule Monday Wednesday Friday. 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 Palliative care has been consulted to establish goals of care. Urology services consulted 04/04/2019 for urinary retention as well as hemorrhagic cystitis underwent cystogram 05/13/2019 a small to moderate amount of clot was evacuated. Ongoing bouts of anemia transfused as needed latest hemoglobin 11, platelets99,000 felt to be induced by heparin during hemodialysis with HIT screen and serotonin release results pending. Patient would have no more heparin for HD until HIT is ruled out..Marland KitchenHe currently on a dysphagia #2 thin liquid dietas well as nasogastric tube for nutritional support. Therapy evaluations completed  and patient was admitted for a comprehensive rehab program 05/22/19 and SLP evaluations were completed 05/23/19 with results as follows:  Evaluations were somewhat limited due to pt's level of fatigue and nausea. Pt did produce a small amount  of emesis near end of session which appeared mucous-like and foamy. Pt's son was present and endorsed changes in pt's ability to "focus", which is exacerbated during moments of increased fatigue and HD days.   Pt presents with cognitive impairments marked by decreased sustained attention to tasks, basic and semi-complex problem solving during verbal and functional tasks, and judgement. Due to time constraints and pt's nausea which required immediate attention, only portions of Cognistat were administered today, but he scored in the moderate impairment range on judgement subtest, and demonstrated difficulty problem solving use of suction for secretion management and during basic calculations. Pt disoriented to time. Pt with good verbal recall of swallow impairments and swallow strategies, however Mod A verbal cues required for accuracy in implementation of chin tuck with thin liquids. Due to nausea pt's PO intake was very limited during bedside evaluation, however he exhibited immediate cough response in 50% trials of thin. Cough reduced once SLP provided verbal cues for more accurate implementation of chin tuck. Recommend pt continue current Dys 2 (minced/ground), thin liquid diet, medications whole in puree, full supervision for use of swallow strategies.   Recommend pt receive skilled ST services to address dysphagia and mild cognitive impairments as described above in order to ensure diet safety and efficiency and maximize functional independence upon d/c.    Skilled Therapeutic Interventions          Bedside swallow and cognitive-linguistic evaluations completed and results were reviewed with pt and his son Letitia Libra (see above for details).    SLP Assessment  Patient will need skilled Speech Lanaguage Pathology Services during CIR admission    Recommendations  Recommended Consults: Consider GI evaluation SLP Diet Recommendations: Dysphagia 2 (Fine chop);Thin Liquid Administration via:  Cup;Straw Medication Administration: Whole meds with puree Supervision: Full supervision/cueing for compensatory strategies;Staff to assist with self feeding Compensations: Chin tuck;Slow rate Postural Changes and/or Swallow Maneuvers: Seated upright 90 degrees Oral Care Recommendations: Oral care BID Patient destination: Home Follow up Recommendations: Home Health SLP;24 hour supervision/assistance Equipment Recommended: None recommended by SLP    SLP Frequency 3 to 5 out of 7 days   SLP Duration  SLP Intensity  SLP Treatment/Interventions 10-14 days  Minumum of 1-2 x/day, 30 to 90 minutes  Dysphagia/aspiration precaution training;Functional tasks;Patient/family education;Cueing hierarchy;Cognitive remediation/compensation;Environmental controls;Internal/external aids    Pain Pain Assessment Pain Scale: 0-10(although pt reported nausea) Pain Score: 0-No pain  Prior Functioning Type of Home: House  Lives With: Spouse Available Help at Discharge: Family;Available 24 hours/day Vocation: Retired  Programmer, systems Overall Cognitive Status: Impaired/Different from baseline Arousal/Alertness: Lethargic Orientation Level: Oriented to person;Oriented to situation;Oriented to place;Disoriented to time Attention: Sustained Sustained Attention: Impaired(likely exacerbated by fatigue) Sustained Attention Impairment: Verbal basic;Functional basic Memory: Appears intact Memory Impairment: Decreased short term memory;Decreased recall of new information Decreased Short Term Memory: Verbal basic;Functional basic Immediate Memory Recall: Sock;Blue;Bed Memory Recall Sock: With Cue Memory Recall Blue: Without Cue Memory Recall Bed: Not able to recall Awareness: Appears intact Problem Solving: Impaired Problem Solving Impairment: Verbal basic Safety/Judgment: Appears intact  Comprehension Auditory Comprehension Overall Auditory Comprehension: Appears within functional limits  for tasks assessed Yes/No Questions: Within Functional Limits Commands: Within Functional Limits Conversation: Complex Visual Recognition/Discrimination Discrimination: Not tested Reading Comprehension Reading Status: Not tested  Expression Expression Primary Mode of Expression: Verbal Verbal Expression Overall Verbal Expression: Appears within functional limits for tasks assessed Initiation: No impairment Repetition: No impairment Naming: No impairment Pragmatics: No impairment Non-Verbal Means of Communication: Not applicable Written Expression Written Expression: Not tested Oral Motor Oral Motor/Sensory Function Overall Oral Motor/Sensory Function: Within functional limits Motor Speech Overall Motor Speech: Appears within functional limits for tasks assessed Respiration: Within functional limits Phonation: Normal Resonance: Within functional limits Articulation: Within functional limitis Intelligibility: Intelligible Motor Planning: Witnin functional limits Motor Speech Errors: Not applicable   Intelligibility: Intelligible  Bedside Swallowing Assessment General Date of Onset: 03/19/19 Previous Swallow Assessment: 05/15/19 recc Dys 2/thin with chin tuck Diet Prior to this Study: Dysphagia 2 (chopped);Thin liquids Temperature Spikes Noted: N/A Respiratory Status: Supplemental O2 delivered via (comment)([place]) History of Recent Intubation: Yes Length of Intubations (days): 6 days Date extubated: 03/27/19 Behavior/Cognition: Alert;Cooperative Oral Cavity - Dentition: Edentulous Self-Feeding Abilities: Needs assist Vision: Functional for self-feeding Baseline Vocal Quality: Normal Volitional Cough: Congested Volitional Swallow: Able to elicit  Oral Care Assessment Does patient have any of the following "high(er) risk" factors?: Diet - patient on tube feedings Does patient have any of the following "at risk" factors?: Lips - dry, cracked;Other - dysphagia;Oxygen  therapy - cannula, mask, simple oxygen devices;Saliva - thick, dry mouth Patient is HIGH RISK: Non-ventilated: Order set for Adult Oral Care Protocol initiated - "High Risk Patients - Non-Ventilated" option selected  (see row information) Ice Chips Ice chips: Not tested Thin Liquid Thin Liquid: Impaired Presentation: Straw Pharyngeal  Phase Impairments: Cough - Immediate Nectar Thick Nectar Thick Liquid: Not tested Honey Thick Honey Thick Liquid: Not tested Puree Puree: Not tested Solid Solid: Not tested BSE Assessment Suspected Esophageal Findings Suspected Esophageal Findings: Other (comment)(pt vomitted at end of session, although emesis appeared more like mucous/foamy saliva) Risk for Aspiration Impact on safety and function: Mild aspiration risk Other Related Risk Factors: Prolonged intubation;Deconditioning  Short Term Goals: Week 1: SLP Short Term Goal 1 (Week 1): Pt will consume current diet with minimal s/sx aspiration and efficient mastication and oral clearance with Min A verbal/visual cues for use of compensatory strategies SLP Short Term Goal 2 (Week 1): Pt will sustain attention to functional tasks with Min A verbal/visual cues for reidrection SLP Short Term Goal 3 (Week 1): Pt will use external aids to orient to date with Min A verbal/visual cues SLP Short Term Goal 4 (Week 1): Pt will demonstrate ability to problem solve basic familiar tasks with Min A verbal/visual cues  Refer to Care Plan for Long Term Goals  Recommendations for other services: None   Discharge Criteria: Patient will be discharged from SLP if patient refuses treatment 3 consecutive times without medical reason, if treatment goals not met, if there is a change in medical status, if patient makes no progress towards goals or if patient is discharged from hospital.  The above assessment, treatment plan, treatment alternatives and goals were discussed and mutually agreed upon: by patient and  family  Arbutus Leas 05/23/2019, 3:19 PM

## 2019-05-23 NOTE — Patient Care Conference (Signed)
Inpatient RehabilitationTeam Conference and Plan of Care Update Date: 05/23/2019   Time: 11:20 AM   Patient Name: Jesse Murphy      Medical Record Number: [id number]  Date of Birth: 27-Apr-1928 Sex: Male         Room/Bed: 4W25C/4W25C-01 Payor Info: Payor: MEDICARE / Plan: MEDICARE PART A AND B / Product Type: *No Product type* /    Admit Date/Time:  05/22/2019  3:48 PM  Primary Diagnosis:  Debility  Patient Active Problem List   Diagnosis Date Noted  . Pressure ulcer 05/23/2019  . Debility 05/22/2019  . AKI (acute kidney injury) ([place])   . Palliative care encounter   . Pressure injury of skin 03/25/2019  . Acute respiratory failure with hypoxia ([place])   . S/P CABG x 3 03/16/2019  . Non-ST elevation (NSTEMI) myocardial infarction ([place])   . Acute on chronic combined systolic and diastolic CHF (congestive heart failure) ([place])   . Coronary artery disease due to lipid rich plaque   . Atrial fibrillation with RVR ([place]) 03/14/2019  . Atrial fibrillation with rapid ventricular response ([place]) 03/13/2019  . Chest pain 09/12/2018  . Chronic diastolic heart failure ([place]) 05/29/2018  . Hypertension 05/29/2018  . Non Hodgkin's lymphoma ([place]) 09/30/2017  . Heme positive stool 08/11/2017  . Chronic anticoagulation 07/05/2016  . CAD S/P percutaneous coronary angioplasty 07/05/2016  . Type 2 diabetes mellitus without complications ([place]) 34/19/6222  . PAF (paroxysmal atrial fibrillation) ([place]) 06/14/2016  . Chest pain in adult 06/14/2016  . Dyspnea on exertion 06/14/2016  . Allergic rhinitis 06/14/2016  . GERD (gastroesophageal reflux disease) 06/14/2016  . BPH (benign prostatic hyperplasia) 06/14/2016    Expected Discharge Date: Expected Discharge Date: 06/09/19  Team Members Present: Physician leading conference: Dr. Leeroy Cha Care Coodinator Present: Nestor Lewandowsky, RN, BSN, CRRN;Genie Shawndra Clute, RN, MSN Nurse Present: Ellison Carwin, LPN PT Present: Drema Dallas Shagen, PT;Carr Zenia Resides,  PT OT Present: Meriel Pica, OT SLP Present: Colon Flattery, SLP PPS Coordinator present : Ileana Ladd, PT     Current Status/Progress Goal Weekly Team Focus  Bowel/Bladder   cont. of bowel and bladder, LBM 05/22/2019.  remain cont of bowel   assess q shift and prn    Swallow/Nutrition/ Hydration   Eval pending        ADL's   max A with LB and mobility  min A with self care, S with toilet or BSC transfer  ADL training, functional mobility, activity toleranc   Mobility   Rolling min/mod Assist, sat EOB mod A, sit to stand mod A, sit to supine min A.        Communication             Safety/Cognition/ Behavioral Observations  Eval pending         Pain   no complaints of pain  remain pain free  assess pain q shift ad prn    Skin   generalized bruising, Rashes on chest and back, stage two wound on bilateral earlobes, deep tissue on tr heeel, sage 2 lef t elbow, stage 2 right buttocks   prevent anyother breakdown, prevent infection  assess skin q shift and prn, administer creams when schedualed, reassess dressings and change when schedualed       *See Care Plan and progress notes for long and short-term goals.     Barriers to Discharge  Current Status/Progress Possible Resolutions Date Resolved   Nursing  Wound Care;Hemodialysis;New oxygen  PT                    OT                  SLP Nutrition means pt still with NG tube despite PO diet recommendations            SW Home environment access/layout 1 level 4 step entry to home            Discharge Planning/Teaching Needs:  Children plan to assist patient and his wife at discharge Fam ed to be scheduled per therapy recommendations.  Team Discussion: MD N/V, given zofram, itching, given benedryl, not eating, monitoring labs, not a candidate for a PEG.  RN cont bowel, inc bladder, BM today, stage 2 bottom, MASD groin.  OT max A LB bathing and mobility, goals min A/close S.  PT orthostatic, mod sit to stand, min  to mod for bed mobility.  SLP eval pending.   Revisions to Treatment Plan: N/A     Medical Summary Current Status: Very nauseous, nausea is interferingg with therapy and ability to tolerate food, pruritic rash Weekly Focus/Goal: Management of nasuea, pruritis, monitoring of bloody stools, monitor incision, breathing  Barriers to Discharge: Medical stability   Possible Resolutions to Barriers: Management of nausea and pruritis, nutrition plan and discussion with family, caregiver training   Continued Need for Acute Rehabilitation Level of Care: The patient requires daily medical management by a physician with specialized training in physical medicine and rehabilitation for the following reasons: Direction of a multidisciplinary physical rehabilitation program to maximize functional independence : Yes Medical management of patient stability for increased activity during participation in an intensive rehabilitation regime.: Yes Analysis of laboratory values and/or radiology reports with any subsequent need for medication adjustment and/or medical intervention. : Yes   I attest that I was present, lead the team conference, and concur with the assessment and plan of the team.   [name] 05/23/2019, 8:13 PM   Team conference was held via web/ teleconference due to [place] - 19

## 2019-05-23 NOTE — Care Management (Signed)
Patient Details  Name: Jesse Murphy MRN: [id number] Date of Birth: 07-03-1928  Today's Date: 05/23/2019  Problem List:  Patient Active Problem List   Diagnosis Date Noted  . Debility 05/22/2019  . AKI (acute kidney injury) ([place])   . Palliative care encounter   . Pressure injury of skin 03/25/2019  . Acute respiratory failure with hypoxia ([place])   . S/P CABG x 3 03/16/2019  . Non-ST elevation (NSTEMI) myocardial infarction ([place])   . Acute on chronic combined systolic and diastolic CHF (congestive heart failure) ([place])   . Coronary artery disease due to lipid rich plaque   . Atrial fibrillation with RVR ([place]) 03/14/2019  . Atrial fibrillation with rapid ventricular response ([place]) 03/13/2019  . Chest pain 09/12/2018  . Chronic diastolic heart failure ([place]) 05/29/2018  . Hypertension 05/29/2018  . Non Hodgkin's lymphoma ([place]) 09/30/2017  . Heme positive stool 08/11/2017  . Chronic anticoagulation 07/05/2016  . CAD S/P percutaneous coronary angioplasty 07/05/2016  . Type 2 diabetes mellitus without complications ([place]) 41/96/2229  . PAF (paroxysmal atrial fibrillation) ([place]) 06/14/2016  . Chest pain in adult 06/14/2016  . Dyspnea on exertion 06/14/2016  . Allergic rhinitis 06/14/2016  . GERD (gastroesophageal reflux disease) 06/14/2016  . BPH (benign prostatic hyperplasia) 06/14/2016   Past Medical History:  Past Medical History:  Diagnosis Date  . Atrial fibrillation ([place])   . CAD (coronary artery disease)    a. s/p PCI in 1992 b. Coronary CT in 01/2018 showing extensive coronary calcification; FFR indeterminate --> medical management pursued at that time as patient not interested in repeat cath  . Cancer [facility])    prostate  . Dyspnea   . Hypertension    Past Surgical History:  Past Surgical History:  Procedure Laterality Date  . CHEST TUBE INSERTION Left 04/23/2019   Procedure: INSERTION PLEURAL DRAINAGE CATHETER TO DRAIN LEFT PLEURAL EFFUSION;  Surgeon: Ivin Poot,  MD;  Location: [place];  Service: Thoracic;  Laterality: Left;  . CLIPPING OF ATRIAL APPENDAGE N/A 03/16/2019   Procedure: CLIPPING OF LEFT  ATRIAL APPENDAGE - USING ATRICLIP SIZE 40;  Surgeon: Ivin Poot, MD;  Location: [place];  Service: Open Heart Surgery;  Laterality: N/A;  . COLONOSCOPY N/A 08/25/2017   Procedure: COLONOSCOPY;  Surgeon: Rogene Houston, MD;  [facility];  Service: Endoscopy;  Laterality: N/A;  . CORONARY ARTERY BYPASS GRAFT N/A 03/16/2019   Procedure: CORONARY ARTERY BYPASS GRAFTING (CABG), ON PUMP, TIMES THREE, USING LEFT INTERNAL MAMMARY ARTERY, RIGHT GREATER SAPHENOUS VEIN HARVESTED ENDOSCOPICALLY;  Surgeon: Ivin Poot, MD;  Location: [place];  Service: Open Heart Surgery;  Laterality: N/A;  swan only  . CYSTOSCOPY WITH FULGERATION N/A 04/30/2019   Procedure: CYSTOSCOPY WITH FULGERATION;  Surgeon: Irine Seal, MD;  Location: [place];  Service: Urology;  Laterality: N/A;  . HERNIA REPAIR    . IR FLUORO GUIDE CV LINE LEFT  04/23/2019  . IR US GUIDE VASC ACCESS LEFT  04/23/2019  . MAZE N/A 03/16/2019   Procedure: MAZE;  Surgeon: Ivin Poot, MD;  Location: [place];  Service: Open Heart Surgery;  Laterality: N/A;  . PLACEMENT OF IMPELLA LEFT VENTRICULAR ASSIST DEVICE Right 03/27/2019   Procedure: Placement Of Impella Left Ventricular Assist Device using ABIOMED Impella 5.5 with SmartAssist Device.;  Surgeon: Wonda Olds, MD;  [facility];  Service: Thoracic;  Laterality: Right;  . PROSTATE SURGERY    . REMOVAL OF IMPELLA LEFT VENTRICULAR ASSIST DEVICE N/A 04/10/2019  Procedure: REMOVAL OF IMPELLA LEFT VENTRICULAR ASSIST DEVICE WITH INSERTION OF RIGHT FEMORAL ARTERIAL LINE;  Surgeon: Ivin Poot, MD;  Location: [place];  Service: Open Heart Surgery;  Laterality: N/A;  . RIGHT/LEFT HEART CATH AND CORONARY ANGIOGRAPHY N/A 03/15/2019   Procedure: RIGHT/LEFT HEART CATH AND CORONARY ANGIOGRAPHY;  Surgeon: Burnell Blanks, MD;  Location: [place] CV  LAB;  Service: Cardiovascular;  Laterality: N/A;  . TEE WITHOUT CARDIOVERSION N/A 03/16/2019   Procedure: TRANSESOPHAGEAL ECHOCARDIOGRAM (TEE);  Surgeon: Prescott Gum, Collier Salina, MD;  Location: [place];  Service: Open Heart Surgery;  Laterality: N/A;  . TRACHEOSTOMY TUBE PLACEMENT N/A 03/27/2019   Procedure: TRACHEOSTOMY placed using Shiley 8DCT Cuffed.;  Surgeon: Wonda Olds, MD;  [facility];  Service: Thoracic;  Laterality: N/A;  . TRANSURETHRAL RESECTION OF BLADDER NECK N/A 05/13/2019   Procedure: CYSTOSCOPY CLOT EVACUATION FULGRATION CYSTOGRAM AND INSTILLATION OF FORMALIN;  Surgeon: Lucas Mallow, MD;  Location: [place];  Service: Urology;  Laterality: N/A;   Social History:  reports that he quit smoking about 29 years ago. His smoking use included cigarettes. He has a 12.50 pack-year smoking history. He has never used smokeless tobacco. He reports that he does not drink alcohol or use drugs.  Family / Support Systems Marital Status: Married Patient Roles: Spouse Children: Son and daughter Anticipated Caregiver: Son Timmothy Sours), daughter Jackelyn Poling) Ability/Limitations of Caregiver: supervision/min assist Caregiver Availability: 24/7  Social History Preferred language: English Religion: Baptist Read: Yes Write: Yes Employment Status: Retired Date Retired/Disabled/Unemployed: Retail buyer   Abuse/Neglect Abuse/Neglect Assessment Can Be Completed: Yes Physical Abuse: Denies Verbal Abuse: Denies Sexual Abuse: Denies Exploitation of patient/patient's resources: Denies Self-Neglect: Denies  Emotional Status Pt's affect, behavior and adjustment status: Flat affect, normal mood and behavior Psychiatric History: Depression  Patient / Family Perceptions, Expectations & Goals Pt/Family understanding of illness & functional limitations: Patient has poor understanding of current health and functional status Premorbid pt/family roles/activities: Independent prior to  admission Anticipated changes in roles/activities/participation: May require assistance and supervision at discharge Pt/family expectations/goals: Patient wants to be "normal" at discharge and back to his pre-admission level of function  [employer]: None Premorbid Home Care/DME Agencies: None Transportation available at discharge: Son will provide transportation Resource referrals recommended: Neuropsychology  Discharge Planning Living Arrangements: Spouse/significant other Support Systems: Spouse/significant other, Children Type of Residence: Private residence Insurance Resources: [employer], Multimedia programmer (specify)([employer] for Life) Money Management: Patient Does the patient have any problems obtaining your medications?: No Home Management: Children will manage the home Patient/Family Preliminary Plans: Children plan to assist patient and his wife at discharge [place] Barriers to Discharge: Home environment access/layout Sw Barriers to Discharge Comments: 1 level 4 step entry to home Social Work Anticipated Follow Up Needs: [place] Additional Notes/Comments: Clip for HD at [facility] Expected length of stay: 10-14 days  Clinical Impression: The patient was very stoic; reported nausea and poor tolerance to his therapy session. Very fatigued after just a little bit of activity. Reported rash on face itches but not bad. Nausea is worse and he does not have an appetite. Kept his eyes closed during most of the conversation. Alert, able to answer questions about homelife. Family very supportive and he wants to be able to return home and be "normal" at discharge. Discussed current issues including cortrak feeding tube and Pleur X for pleural effusion. Reports an understanding of need to be connected with OP HD at [facility] at discharge.  [name] 05/23/2019, 9:49 AM

## 2019-05-23 NOTE — Progress Notes (Signed)
[facility] PHYSICAL MEDICINE & REHABILITATION PROGRESS NOTE   Subjective/Complaints: Unable to tolerate OT session well today; limited by fatigue and nasuea. MinA goals, Close supervision with transfers. Wife will not be able to assist much at home, but son and daughter will be able to help. Nauseous today, throwing up during OT.   ROS: Denies pain, constipation, insomnia  Objective:   DG Chest Port 1 View  Result Date: 05/22/2019 CLINICAL DATA:  Status post coronary bypass grafting EXAM: PORTABLE CHEST 1 VIEW COMPARISON:  05/19/2019 FINDINGS: Postsurgical changes are again seen. Feeding catheter is again identified and stable as is a left jugular dialysis catheter. Lungs are well aerated bilaterally. Minimal vascular congestion is noted without interstitial edema. Small posteriorly layering left-sided pleural effusion is seen. Left PleurX tube is again noted and stable. IMPRESSION: Mild vascular congestion without interstitial edema. Small left pleural effusion posteriorly. Tubes and lines as described. Electronically Signed   By: Inez Catalina M.D.   On: 05/22/2019 08:43   Recent Labs    05/22/19 0235 05/22/19 1651  WBC 8.7 5.2  HGB 11.0* 11.1*  HCT 35.6* 35.7*  PLT 99* 101*   Recent Labs    05/22/19 0235 05/22/19 1651  NA 133* 131*  K 4.2 4.9  CL 95* 96*  CO2 22 22  GLUCOSE 161* 220*  BUN 135* 149*  CREATININE 6.29* 7.12*  CALCIUM 7.9* 7.7*    Intake/Output Summary (Last 24 hours) at 05/23/2019 1111 Last data filed at 05/23/2019 0830 Gross per 24 hour  Intake 20 ml  Output 624 ml  Net -604 ml     Physical Exam: Vital Signs Blood pressure (!) 130/51, pulse 76, temperature (!) 97.4 F (36.3 C), temperature source Oral, resp. rate 18, height 5\' 8"  (1.727 m), weight 85.2 kg, SpO2 99 %.     Assessment/Plan: 1. Functional deficits secondary to debility which require 3+ hours per day of interdisciplinary therapy in a comprehensive inpatient rehab setting.  Physiatrist  is providing close team supervision and 24 hour management of active medical problems listed below.  Physiatrist and rehab team continue to assess barriers to discharge/monitor patient progress toward functional and medical goals  Care Tool:  Bathing              Bathing assist       Upper Body Dressing/Undressing Upper body dressing        Upper body assist      Lower Body Dressing/Undressing Lower body dressing      What is the patient wearing?: Underwear/pull up, Pants     Lower body assist Assist for lower body dressing: Total Assistance - Patient < 25%     Toileting Toileting    Toileting assist Assist for toileting: Total Assistance - Patient < 25%     Transfers Chair/bed transfer  Transfers assist  Chair/bed transfer activity did not occur: Safety/medical concerns(nausea/dizziness/fatigue)        Locomotion Ambulation   Ambulation assist   Ambulation activity did not occur: Safety/medical concerns(nausea/dizziness/fatigue)          Walk 10 feet activity   Assist  Walk 10 feet activity did not occur: Safety/medical concerns        Walk 50 feet activity   Assist Walk 50 feet with 2 turns activity did not occur: Safety/medical concerns         Walk 150 feet activity   Assist Walk 150 feet activity did not occur: Safety/medical concerns  Walk 10 feet on uneven surface  activity   Assist Walk 10 feet on uneven surfaces activity did not occur: Safety/medical concerns         Wheelchair     Assist Will patient use wheelchair at discharge?: No(sternal precautions)             Wheelchair 50 feet with 2 turns activity    Assist            Wheelchair 150 feet activity     Assist          Blood pressure (!) 130/51, pulse 76, temperature (!) 97.4 F (36.3 C), temperature source Oral, resp. rate 18, height 5\' 8"  (1.727 m), weight 85.2 kg, SpO2 99 %.  Medical Problem List and  Plan: 1.Debilitysecondary to CAD status post CABG 03/15/2019. Impella device removed 04/10/2019. Sternal precautions -patient maynotshowerdue to L IJ for HD- unless way to cover -ELOS/Goals: 2-2.5 weeks minimum- goals Supervision to [facility]  -Team conference today.  2. Antithrombotics: -DVT/anticoagulation:SCDs- due to hematuria -antiplatelet therapy: Aspirin 81 mg daily 3. Pain Management:Oxycodone as needed 4. Mood:Provide emotional support -antipsychotic agents: N/A 5. Neuropsych: This patientiscapable of making decisions on hisown behalf. 6. Skin/Wound Care:Routine skin checks 7. Fluids/Electrolytes/Nutrition:Routine in and outs with follow-up chemistries 8. Acute systolic congestive heart failure/cardiogenic shock. Follow-up cardiology services 9. Acute hypoxic respiratory failure with Pseudomonas pneumonia. Status post tracheostomy 03/27/2019. Decannulated 04/28/2019. 10.Acute on chronic anemia/thrombocytopenia. Follow-up CBC.Serotonin releaseassaypending. No more heparin for HD for now 11. Dysphagia. Dysphagia #2 thin liquids. Follow-up speech therapy.Nasogastric tube/cortrakfor nutritional support. Check calorie counts. Dietary follow-up- see dietary note dated 4/6 for additional information. PEG not safe option as per cardiothoracic surgery team. Goal is to get nausea under better control so he can tolerate feeds better.  12. 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 Patient has been transitioned to dialysis and being sent to OP HD after CIR, so is chronicand followed by renal services. Tunneled L IJcatheter placed 04/24/2019 13. Hematuria. Cystoscopy completed with evacuation of clot. No further bleeding episodes per notes, but has hematuria today- dark wine color hematuria- if doesn't stop, will call Urology back. 14. Left pleural effusion. Status post Pleurx catheter 04/23/2019.PATIENT WILL KEEP  PLEUREX CATHETER FOR NOW PER CVTS.Currently being drained Mondaysand Thursdays 15. Diffuse drug rashversus contact dermatitis. Appears to be resolving. Steroid taper as directed and completed- itching improved- con't supportive care. Benadryl prn. Added Sarna lotion. 16.Rectal bleeding. 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 Follow-up GI with conservative care no further bleeding reported. 17. PAF. Patient remains normal sinus rhythm. Currently off amiodarone. No plan to resume any anticoagulation at this time until hematuria fully resolved. 18. Nausea: prn Zofran. Consider Compazine if does not help.   LOS: 1 days A FACE TO FACE EVALUATION WAS PERFORMED  Annajulia Lewing P Rhena Glace 05/23/2019, 11:11 AM

## 2019-05-23 NOTE — Progress Notes (Signed)
Pt refused his dinner and all of evening medications. This nurse will ask if he would like the topical creams that are to be applied to buttocks when he is being cleaned up. Pt is very drowsy and stated he just wants to sleep. He stated he was not nauseous at this time.  No other concerns to report a this time, will continue to monitor.

## 2019-05-23 NOTE — Progress Notes (Addendum)
Initial Nutrition Assessment  **RD working remotely**  DOCUMENTATION CODES:   Not applicable  INTERVENTION:   Pt day 64 with [name]. Poor candidate for PEG per TCTS.   Tube feeding:  -Nepro @ 50 ml/hr (1000 ml) via [name] for 20 hours (tube feeding can be held for up to 4 hours for therapies) -60 ml Prostat BID -254ml free water flushes Q4H (or per MD/PA)  Provides: 2200 kcals, 141 grams protein, 727 ml free water (2112ml with free water flushes).    NUTRITION DIAGNOSIS:   Increased nutrient needs related to wound healing as evidenced by estimated needs.   GOAL:   Patient will meet greater than or equal to 90% of their needs   MONITOR:   Diet advancement, TF tolerance, Skin, Weight trends, Labs, I & O's  REASON FOR ASSESSMENT:   Consult Enteral/tube feeding initiation and management  ASSESSMENT:   Patient with PMH significant for CAD s/p PCI, prostate cancer, HTN, CHF, and DM. Presented this admission with chronic a.fib with RVR. Pt admitted to CIR 05/22/19.  1/28- s/p L heart cath  1/30- s/p CABG x3, MAZE, extubated  2/2- re-intubated  2/5- extubated 2/8- re-intubated  2/10- s/p impella, trach, NGT placed-start trickle feeding 2/11- start CRRT 2/23- removal of impella, [name] dislodged  2/24- CRRT stop 3/1- failed iHD, back on CRRT 3/2- s/p [name], nocturnal feedings started 3/4- diet advanced DYS2 , nectar thick  3/8- s/p L PleurX catheter and L TDC 3/10- tolerated iHD 3/13- s/p decannulation  3/15- s/p cystoscopy  3/17- [name] clogged, s/p replacement   RD unable to reach pt via phone. Discussed pt with RN.  Per RD notes from yesterday, pt is refusing bolus feedings. Pt is also noted to be refusing medications including binder therapy due to ongoing nausea. Per previous documentation, pt has complained of nausea/vomiting after po intake. Unsure if delayed gastric emptying/fullness is causing these symptoms. Consider d/c [name] and pursuing  comfort feedings if long term feeding tube is not an option.   PO Intake: 0% x 1 recorded meal  Last HD 4/6, net UF 660ml  Medications reviewed and include: Phoslyra, Pepcid, SS Novolog, Rena-vit, Deltasone, Mylicon  Labs reviewed: Sodium 131 (L), Phosphorus 6.3 (H), CBGs 167-207   NUTRITION - FOCUSED PHYSICAL EXAM:  RD unable to perform at this time. RD will attempt at follow-up.    Diet Order:   Diet Order            DIET DYS 2 Room service appropriate? No; Fluid consistency: Thin  Diet effective now              EDUCATION NEEDS:   No education needs have been identified at this time  Skin:  Skin Integrity Issues:: DTI, Stage II, Incisions DTI: R heel Stage II: L/R ears, L/R buttocks, L elbow Incisions: perineum, R leg  Last BM:  4/6  Height:   Ht Readings from Last 1 Encounters:  05/22/19 5\' 8"  (1.727 m)    Weight:   Wt Readings from Last 1 Encounters:  05/23/19 85.2 kg    BMI:  Body mass index is 28.56 kg/m.  Estimated Nutritional Needs:   Kcal:  2200-2400  Protein:  135-155 grams  Fluid:  >/= 2.2 L/d   Larkin Ina, MS, RD, LDN RD pager number and weekend/on-call pager number located in [place].

## 2019-05-23 NOTE — Evaluation (Signed)
Physical Therapy Assessment and Plan  Patient Details  Name: Jesse Murphy MRN: [id number] Date of Birth: 05-10-1928  PT Diagnosis: Abnormal posture, Difficulty walking, Dizziness and giddiness, and Muscle weakness Rehab Potential: Good ELOS: 14-18 days   Today's Date: 05/23/2019 PT Individual Time: 9678-9381 PT Individual Time Calculation (min): 55 min   and Today's Date: 05/23/2019 PT Missed Time: 20 Minutes Missed Time Reason: Patient fatigue;Patient ill (Comment)(nausea/dizziness)   Problem List:  Patient Active Problem List   Diagnosis Date Noted   Debility 05/22/2019   AKI (acute kidney injury) [facility])    Palliative care encounter    Pressure injury of skin 03/25/2019   Acute respiratory failure with hypoxia (HCC)    S/P CABG x 3 03/16/2019   Non-ST elevation (NSTEMI) myocardial infarction [facility])    Acute on chronic combined systolic and diastolic CHF (congestive heart failure) ([place])    Coronary artery disease due to lipid rich plaque    Atrial fibrillation with RVR ([place]) 03/14/2019   Atrial fibrillation with rapid ventricular response ([place]) 03/13/2019   Chest pain 09/12/2018   Chronic diastolic heart failure ([place]) 05/29/2018   Hypertension 05/29/2018   Non Hodgkin's lymphoma ([place]) 09/30/2017   Heme positive stool 08/11/2017   Chronic anticoagulation 07/05/2016   CAD S/P percutaneous coronary angioplasty 07/05/2016   Type 2 diabetes mellitus without complications ([place]) 01/75/1025   PAF (paroxysmal atrial fibrillation) ([place]) 06/14/2016   Chest pain in adult 06/14/2016   Dyspnea on exertion 06/14/2016   Allergic rhinitis 06/14/2016   GERD (gastroesophageal reflux disease) 06/14/2016   BPH (benign prostatic hyperplasia) 06/14/2016    Past Medical History:  Past Medical History:  Diagnosis Date   Atrial fibrillation ([place])    CAD (coronary artery disease)    a. s/p PCI in 1992 b. Coronary CT in 01/2018 showing extensive coronary calcification; FFR indeterminate -->  medical management pursued at that time as patient not interested in repeat cath   Cancer [facility])    prostate   Dyspnea    Hypertension    Past Surgical History:  Past Surgical History:  Procedure Laterality Date   CHEST TUBE INSERTION Left 04/23/2019   Procedure: INSERTION PLEURAL DRAINAGE CATHETER TO DRAIN LEFT PLEURAL EFFUSION;  Surgeon: Ivin Poot, MD;  Location: [place];  Service: Thoracic;  Laterality: Left;   CLIPPING OF ATRIAL APPENDAGE N/A 03/16/2019   Procedure: CLIPPING OF LEFT  ATRIAL APPENDAGE - USING ATRICLIP SIZE 40;  Surgeon: Ivin Poot, MD;  Location: [place];  Service: Open Heart Surgery;  Laterality: N/A;   COLONOSCOPY N/A 08/25/2017   Procedure: COLONOSCOPY;  Surgeon: Rogene Houston, MD;  [facility];  Service: Endoscopy;  Laterality: N/A;   CORONARY ARTERY BYPASS GRAFT N/A 03/16/2019   Procedure: CORONARY ARTERY BYPASS GRAFTING (CABG), ON PUMP, TIMES THREE, USING LEFT INTERNAL MAMMARY ARTERY, RIGHT GREATER SAPHENOUS VEIN HARVESTED ENDOSCOPICALLY;  Surgeon: Ivin Poot, MD;  Location: [place];  Service: Open Heart Surgery;  Laterality: N/A;  swan only   CYSTOSCOPY WITH FULGERATION N/A 04/30/2019   Procedure: CYSTOSCOPY WITH FULGERATION;  Surgeon: Irine Seal, MD;  Location: [place];  Service: Urology;  Laterality: N/A;   HERNIA REPAIR     IR FLUORO GUIDE CV LINE LEFT  04/23/2019   IR US GUIDE VASC ACCESS LEFT  04/23/2019   MAZE N/A 03/16/2019   Procedure: MAZE;  Surgeon: Ivin Poot, MD;  Location: [place];  Service: Open Heart Surgery;  Laterality: N/A;   PLACEMENT OF IMPELLA  LEFT VENTRICULAR ASSIST DEVICE Right 03/27/2019   Procedure: Placement Of Impella Left Ventricular Assist Device using ABIOMED Impella 5.5 with SmartAssist Device.;  Surgeon: Wonda Olds, MD;  Location: [facility];  Service: Thoracic;  Laterality: Right;   PROSTATE SURGERY     REMOVAL OF IMPELLA LEFT VENTRICULAR ASSIST DEVICE N/A 04/10/2019   Procedure: REMOVAL OF IMPELLA LEFT  VENTRICULAR ASSIST DEVICE WITH INSERTION OF RIGHT FEMORAL ARTERIAL LINE;  Surgeon: Ivin Poot, MD;  Location: [place];  Service: Open Heart Surgery;  Laterality: N/A;   RIGHT/LEFT HEART CATH AND CORONARY ANGIOGRAPHY N/A 03/15/2019   Procedure: RIGHT/LEFT HEART CATH AND CORONARY ANGIOGRAPHY;  Surgeon: Burnell Blanks, MD;  Location: [place] CV LAB;  Service: Cardiovascular;  Laterality: N/A;   TEE WITHOUT CARDIOVERSION N/A 03/16/2019   Procedure: TRANSESOPHAGEAL ECHOCARDIOGRAM (TEE);  Surgeon: Prescott Gum, Collier Salina, MD;  Location: [place];  Service: Open Heart Surgery;  Laterality: N/A;   TRACHEOSTOMY TUBE PLACEMENT N/A 03/27/2019   Procedure: TRACHEOSTOMY placed using Shiley 8DCT Cuffed.;  Surgeon: Wonda Olds, MD;  [facility];  Service: Thoracic;  Laterality: N/A;   TRANSURETHRAL RESECTION OF BLADDER NECK N/A 05/13/2019   Procedure: CYSTOSCOPY CLOT EVACUATION FULGRATION CYSTOGRAM AND INSTILLATION OF FORMALIN;  Surgeon: Lucas Mallow, MD;  Location: [place];  Service: Urology;  Laterality: N/A;    Assessment & Plan Clinical Impression: Patient is a 86 y.o. year old male with history of CAD, chronic atrial fibrillation on Xarelto as well as non-Hodgkin's lymphoma, chronic anemia, hypertension, diastolic congestive heart failure, prostate cancer.  Per chart review patient lives with spouse in [place].  Independent prior to admission.  Wife uses a cane.  Local children check on him as needed.  Presented to [facility] 03/13/2019 with tachycardia as well as vague associated chest discomfort and palpitations.  Initially heart rate 200 bpm was given intravenous Cardizem.  Initial labs hemoglobin 11.3, platelets 187,000, sodium 138, potassium 3.9, creatinine 0.96 initial at bedtime troponin 32 with repeat values of 440, 926 and 585.  Chest x-ray showed no evidence of consolidation or effusion.  EKG showed atrial fibrillation with RVR heart rate 145 with no left bundle branch block.   DCCV was attempted in the ED but he remained in atrial fibrillation.  Echocardiogram showed ejection fraction of 35% as compared to previous echoes 55 to 60% 12/2017.  He was transferred to [facility] for further evaluation.  Cardiology service follow-up and patient with cardiac catheterization with LM/LAD and RCA disease.  Patient underwent CABG x3 left atrial Maze procedure 03/17/2019 per Dr. Darcey Nora.  Impella removed 04/10/2019 patient did require long-term intubation ultimately due to acute hypoxic respiratory failure tracheostomy tube completed 03/27/2019 he was decannulated 04/28/2019 stable nasal cannula.  Patient did have left pleural effusion necessitating need for Pleurx catheter 04/23/2019.  He developed a rash felt to be possibly drug-induced responded well to prednisone taper.  Nephrology consulted 03/29/2019 for AKI with creatinine 4.43 from baseline 1.7.  Patient ultimately required hemodialysis and currently remains on schedule Monday Wednesday Friday.  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  Palliative care has been consulted to establish goals of care.  Urology services consulted 04/04/2019 for urinary retention as well as hemorrhagic cystitis underwent cystogram 05/13/2019 a small to moderate amount of clot was evacuated.  Ongoing bouts of anemia transfused as needed latest hemoglobin 11, platelets 99,000 felt to be induced by  heparin during hemodialysis with HIT screen and serotonin release results pending.  Patient would have no more heparin for HD until HIT is ruled out.Marland Kitchen  He currently on a dysphagia #2 thin liquid diet as well as nasogastric tube for nutritional support.  Therapy evaluations completed and patient was admitted for a comprehensive rehab progra.  .  Patient transferred to CIR on 05/22/2019 .   Patient currently requires mod with mobility secondary to muscle weakness  and muscle joint tightness, decreased cardiorespiratoy endurance and decreased oxygen support, and decreased sitting balance, decreased standing balance, and decreased balance strategies.  Prior to hospitalization, patient was independent  with mobility and lived with Spouse in a House home.  Home access is 4(back door entrance)Stairs to enter.  Patient will benefit from skilled PT intervention to maximize safe functional mobility, minimize fall risk, and decrease caregiver burden for planned discharge home with 24 hour supervision.  Anticipate patient will benefit from follow up [place] at discharge.  PT - End of Session Activity Tolerance: Tolerates 10 - 20 min activity with multiple rests Endurance Deficit: Yes Endurance Deficit Description: requires supplemental O2, fatigue PT Assessment Rehab Potential (ACUTE/IP ONLY): Good PT Patient demonstrates impairments in the following area(s): Balance;Safety;Behavior;Sensory;Edema;Endurance;Motor PT Transfers Functional Problem(s): Bed Mobility;Bed to Chair;Car;Furniture PT Locomotion Functional Problem(s): Wheelchair Mobility;Stairs;Ambulation PT Plan PT Intensity: Minimum of 1-2 x/day ,45 to 90 minutes PT Frequency: 5 out of 7 days PT Duration Estimated Length of Stay: 10-14 days PT Treatment/Interventions: Ambulation/gait training;Community reintegration;DME/adaptive equipment instruction;Neuromuscular re-education;Psychosocial support;Stair training;UE/LE Strength taining/ROM;Balance/vestibular training;Discharge planning;Functional electrical stimulation;Pain management;Skin care/wound management;Therapeutic Activities;UE/LE Coordination activities;Cognitive remediation/compensation;Disease management/prevention;Functional mobility training;Patient/family education;Therapeutic Exercise PT Transfers Anticipated Outcome(s): supervision-CGA PT Locomotion Anticipated Outcome(s): supervision PT Recommendation Follow Up Recommendations: Home health  PT Patient destination: Home Equipment Recommended: To be determined  Skilled Therapeutic Intervention Evaluation completed (see details above and below) with education on PT POC and goals and individual treatment initiated with focus on functional mobility, transfer, OOB tolerance and safety. Pt in bed asleep upon PT arrival, pt initially hesitant to participate as he reports getting back from dialysis last night at 1am. Therapist provided encouragement for OOB activity and exercise in order to progress mobility and work towards going home, pt agreeable. Pt participated in strength and sensory testing as detailed below. Pt performed rolling this session with min-mod assist in order to don brief. Pt transferred to sitting EOB this session with mod assist for trunk elevation, cues for sternal precautions. Pt able to sit EOB x 5 min with supervision, cues to scoot forward to place feet on the ground. In sitting checked vitals- 138/62, SpO296% and HR 79 bpm. Pt performed sit<>stand this session with RW and mod assist, cues for techniques and upright posture. Pt only able to maintain standing x 15 sec before needing to sit secondary to fatigue/nausea/dizziness. Vitals checked again in sitting- BP 126/56, HR 77 bpm, SpO2 96%. Pt seated EOB x 8 minutes while spitting/dry heaving with therapist providing emesis bag and cold wash cloth to wipe mouth. Therapist notified RN of nausea. Pt scooted laterally towards HOB mod assist. Sit>supine min assist. Pt left supine in bed at end of session, needs in reach and bed alarm set. Missed 20 minutes of skilled therapy tx secondary to nausea/fatigue.    PT Evaluation Precautions/Restrictions Precautions Precautions: Fall;Sternal Precaution Booklet Issued: No Precaution Comments: slightly orthostatic Restrictions Weight Bearing Restrictions: No Other Position/Activity Restrictions: sternal precautions General PT Amount of Missed Time (min): 20 Minutes PT Missed  Treatment Reason: Patient fatigue;Patient  ill (Comment)(nausea/dizziness) Vital Signs  Pain  denies pain Home Living/Prior Functioning Home Living Available Help at Discharge: Family;Available 24 hours/day(wife can provide light assist, son can provide assist) Type of Home: House Home Access: Stairs to enter Entrance Stairs-Number of Steps: 4(back door entrance) Entrance Stairs-Rails: Right;Left;Can reach both Home Layout: One level Additional Comments: lift chair  Lives With: Spouse Prior Function Level of Independence: Independent with basic ADLs;Independent with gait(has RW and w/c)  Able to Take Stairs?: Yes Driving: Yes Vocation: Retired Associate Professor Overall Cognitive Status: No family/caregiver present to determine baseline cognitive functioning Orientation Level: Oriented X4 Attention: Sustained Sustained Attention: Appears intact Sensation Sensation Light Touch: Appears Intact Proprioception: Appears Intact Additional Comments: sensation grossly intact, slightly diminished in feet/toes Coordination Gross Motor Movements are Fluid and Coordinated: No Fine Motor Movements are Fluid and Coordinated: No Coordination and Movement Description: coordination impaired B LEs secondary to generalized weakness Motor  Motor Motor: Other (comment) Motor - Skilled Clinical Observations: generalized weakness  Mobility Bed Mobility Bed Mobility: Rolling Right;Rolling Left;Supine to Sit;Sit to Supine Rolling Right: Minimal Assistance - Patient > 75% Rolling Left: Minimal Assistance - Patient > 75% Supine to Sit: Moderate Assistance - Patient 50-74% Sitting - Scoot to Edge of Bed: Minimal Assistance - Patient > 75% Sit to Supine: Minimal Assistance - Patient > 75% Transfers Transfers: Sit to Stand Sit to Stand: Moderate Assistance - Patient 50-74% Transfer (Assistive device): Rolling walker Locomotion  Gait Ambulation: No Gait Gait: No Stairs / Additional Locomotion Stairs: No   Trunk/Postural Assessment  Cervical Assessment Cervical Assessment: Exceptions to WFL(forwad head posture) Thoracic Assessment Thoracic Assessment: Exceptions to WFL(rounded shoulders) Lumbar Assessment Lumbar Assessment: Exceptions to WFL(posterior pelvic tilt) Postural Control Postural Control: Deficits on evaluation Protective Responses: impaired  Balance Balance Balance Assessed: Yes Static Sitting Balance Static Sitting - Level of Assistance: 5: Stand by assistance Dynamic Sitting Balance Dynamic Sitting - Level of Assistance: 5: Stand by assistance;4: Min assist Static Standing Balance Static Standing - Level of Assistance: 4: Min assist Dynamic Standing Balance Dynamic Standing - Level of Assistance: 4: Min assist Extremity Assessment  RLE Assessment Passive Range of Motion (PROM) Comments: tight heel cords/hamstrings General Strength Comments: grossly 3- to 4/5 throughout LLE Assessment Passive Range of Motion (PROM) Comments: tight heel cords/hamstrings General Strength Comments: grossly 3- to 4/5 throughout    Refer to Care Plan for Long Term Goals  Recommendations for other services: None   Discharge Criteria: Patient will be discharged from PT if patient refuses treatment 3 consecutive times without medical reason, if treatment goals not met, if there is a change in medical status, if patient makes no progress towards goals or if patient is discharged from hospital.  The above assessment, treatment plan, treatment alternatives and goals were discussed and mutually agreed upon: by patient  Netta Corrigan, PT, DPT, CSRS 05/23/2019, 9:13 AM

## 2019-05-23 NOTE — Care Management (Addendum)
[facility] Individual Statement of Services  Patient Name:  Jesse Murphy  Date:  05/23/2019  Welcome to the [facility].  Our goal is to provide you with an individualized program based on your diagnosis and situation, designed to meet your specific needs.  With this comprehensive rehabilitation program, you will be expected to participate in at least 3 hours of rehabilitation therapies Monday-Friday, with modified therapy programming on the weekends.  Your rehabilitation program will include the following services:  Physical Therapy (PT), Occupational Therapy (OT), Speech Therapy (ST), 24 hour per day rehabilitation nursing, Therapeutic Recreaction (TR), Neuropsychology, Case Management (Social Worker), Rehabilitation Medicine, Nutrition Services and Pharmacy Services  Weekly team conferences will be held on Wednesdays to discuss your progress.  Your Social Industrial/product designer will talk with you frequently to get your input and to update you on team discussions.  Team conferences with you and your family in attendance may also be held.  Expected length of stay: 10 days  Overall anticipated outcome: Supervision to Mod I  Depending on your progress and recovery, your program may change. Your Social Industrial/product designer will coordinate services and will keep you informed of any changes. Your Social Worker's/Care Manager's name and contact numbers are listed  below.  The following services may also be recommended but are not provided by the [facility]:    [place] will be made to provide these services after discharge if needed.  Arrangements include referral to agencies that provide these services.  Your insurance has been verified to be:  Medicare/Tricare Your primary doctor is:  Zenon Mayo, MD  Pertinent information will be shared with your doctor and  your insurance company.  Care Manager/Social Worker:  Dorien Chihuahua, RN  [phone] or [phone]  Information discussed with and copy given to patient by: Margarito Liner, 05/23/2019, 8:36 AM

## 2019-05-23 NOTE — Progress Notes (Signed)
Patient information reviewed and entered into eRehab System by Becky Preslie Depasquale, PPS coordinator. Information including medical coding, function ability, and quality indicators will be reviewed and updated through discharge.   

## 2019-05-23 NOTE — Progress Notes (Signed)
Pt noted with difficulty with HD catheter. Only able to dialyze pt with BFR-200, despite intervention. Arterial pressures increased at any flow rate increase. Pt noted to be hypotensive with UF, c/o nausea at times. SBP frequently in 80s.  Dr. Carolin Sicks notified, received new order to discontinue UF and pt will follow up with IR for further intervention of HD cath issues.

## 2019-05-23 NOTE — Progress Notes (Signed)
[facility] NEPHROLOGY PROGRESS NOTE  Assessment/ Plan: Pt is a 86 y.o. yo male with history of CHF, cardiogenic shock requiring Impella support, status post CABG, A. fib, hemorrhagic cystitis followed by urologist, consulted for AKI now dialysis dependent.  #Acute kidney injury likely ATN due to cardiogenic shock/CABG surgery: Now dialysis dependent.  No sign of recovery so far. Concern for HIT however the test result was negative.  We will try to avoid heparin.  Needs outpatient HD arrangement for AKI. -Problem with HD catheter yesterday required TPA.  We will attempt HD today using catheter.  If problem with the blood flow then will need to exchange HD catheter by IR.  Discussed with the patient and HD nurse.  #Acute systolic heart failure/cardiogenic shock/status post Impella support which was removed.  Tolerating dialysis so far.  # Anemia due to critical illness/acute blood loss: Hb at goal.  # Secondary hyperparathyroidism: PTH 116. On phoslyra. Monitor phosphorus level.  # HTN/volume: BP and volume status looks acceptable.  UF during dialysis.  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  #Hyperkalemia: Resolved.  #Diffuse maculopapular rash around face and upper body: Concerned for allergic reaction to facial moisturizer, receiving steroid.  Better now.  Subjective: Seen and examined.  Moved to inpatient rehab.  Had a problem with HD catheter yesterday with poor blood flow.  Denies nausea vomiting chest pain shortness of breath. Objective Vital signs in last 24 hours: Vitals:   05/22/19 2230 05/22/19 2236 05/22/19 2330 05/23/19 0454  BP: (!) 110/54 124/60 134/81 (!) 130/51  Pulse: 73 75 78 76  Resp:  16 18 18   Temp:  97.8 F (36.6 C) 97.6 F (36.4 C) (!) 97.4 F (36.3 C)  TempSrc:  Oral Oral Oral  SpO2:  96% 98% 99%  Weight:  84 kg  85.2 kg  Height:       Weight change:   Intake/Output Summary (Last 24 hours) at  05/23/2019 1109 Last data filed at 05/23/2019 0830 Gross per 24 hour  Intake 20 ml  Output 624 ml  Net -604 ml       Labs: Basic Metabolic Panel: Recent Labs  Lab 05/21/19 0135 05/22/19 0235 05/22/19 1651  NA 132* 133* 131*  K 4.4 4.2 4.9  CL 95* 95* 96*  CO2 25 22 22   GLUCOSE 109* 161* 220*  BUN 114* 135* 149*  CREATININE 5.37* 6.29* 7.12*  CALCIUM 7.8* 7.9* 7.7*  PHOS 5.7* 6.4* 6.3*   Liver Function Tests: Recent Labs  Lab 05/21/19 0135 05/22/19 0235 05/22/19 1651  ALBUMIN 2.0* 2.0* 2.1*   No results for input(s): LIPASE, AMYLASE in the last 168 hours. No results for input(s): AMMONIA in the last 168 hours. CBC: Recent Labs  Lab 05/19/19 0229 05/19/19 0229 05/20/19 0257 05/20/19 0257 05/21/19 0135 05/22/19 0235 05/22/19 1651  WBC 6.8   < > 7.1   < > 7.7 8.7 5.2  HGB 10.9*   < > 11.0*   < > 11.0* 11.0* 11.1*  HCT 35.4*   < > 35.6*   < > 35.1* 35.6* 35.7*  MCV 93.7  --  92.7  --  92.4 93.7 92.5  PLT 92*   < > 87*   < > 91* 99* 101*   < > = values in this interval not displayed.   Cardiac Enzymes: No results for input(s): CKTOTAL, CKMB, CKMBINDEX, TROPONINI in the last 168 hours. CBG: Recent Labs  Lab 05/22/19 [phone] 05/22/19 1115 05/22/19 1638 05/22/19  2348 05/23/19 0637  GLUCAP 134* 139* 207* 191* 167*    Iron Studies: No results for input(s): IRON, TIBC, TRANSFERRIN, FERRITIN in the last 72 hours. Studies/Results: DG Chest Port 1 View  Result Date: 05/22/2019 CLINICAL DATA:  Status post coronary bypass grafting EXAM: PORTABLE CHEST 1 VIEW COMPARISON:  05/19/2019 FINDINGS: Postsurgical changes are again seen. Feeding catheter is again identified and stable as is a left jugular dialysis catheter. Lungs are well aerated bilaterally. Minimal vascular congestion is noted without interstitial edema. Small posteriorly layering left-sided pleural effusion is seen. Left PleurX tube is again noted and stable. IMPRESSION: Mild vascular congestion without  interstitial edema. Small left pleural effusion posteriorly. Tubes and lines as described. Electronically Signed   By: Inez Catalina M.D.   On: 05/22/2019 08:43    Medications: Infusions: . anticoagulant sodium citrate    . feeding supplement (NEPRO CARB STEADY)      Scheduled Medications: . aspirin  81 mg Oral Daily  . calcium acetate (Phos Binder)  667 mg Per Tube TID WC  . Chlorhexidine Gluconate Cloth  6 each Topical BID  . Chlorhexidine Gluconate Cloth  6 each Topical Q0600  . famotidine  10 mg Oral Daily  . feeding supplement (PRO-STAT SUGAR FREE 64)  60 mL Per Tube BID  . Gerhardt's butt cream   Topical BID  . hydrocortisone   Rectal BID  . hydrocortisone  25 mg Rectal BID  . hydrocortisone cream   Topical BID  . insulin aspart  0-15 Units Subcutaneous TID WC  . multivitamin  1 tablet Per Tube QHS  . ondansetron  4 mg Oral Once  . predniSONE  10 mg Per Tube Q breakfast  . simethicone  80 mg Oral TID    have reviewed scheduled and prn medications.  Physical Exam: General:NAD, comfortable, rash improving.Marland Kitchen Heart:RRR, s1s2 nl Lungs:clear b/l, no crackle Abdomen:soft, Non-tender, non-distended Extremities: Trace LE edema Dialysis Access: L IJ TDC for the access.  Shametra Cumberland Tanna Furry 05/23/2019,11:09 AM  LOS: 1 day  Pager: [phone]

## 2019-05-23 NOTE — Evaluation (Signed)
Occupational Therapy Assessment and Plan  Patient Details  Name: Jesse Murphy MRN: [id number] Date of Birth: 1928/12/22  OT Diagnosis: cognitive deficits, muscular wasting and disuse atrophy and muscle weakness (generalized) Rehab Potential: Rehab Potential (ACUTE ONLY): Good ELOS: 06/09/19 - 18-20 days   Today's Date: 05/23/2019 OT Individual Time: 1000-1100 OT Individual Time Calculation (min): 60 min     Problem List:  Patient Active Problem List   Diagnosis Date Noted  . Pressure ulcer 05/23/2019  . Debility 05/22/2019  . AKI (acute kidney injury) ([place])   . Palliative care encounter   . Pressure injury of skin 03/25/2019  . Acute respiratory failure with hypoxia ([place])   . S/P CABG x 3 03/16/2019  . Non-ST elevation (NSTEMI) myocardial infarction ([place])   . Acute on chronic combined systolic and diastolic CHF (congestive heart failure) ([place])   . Coronary artery disease due to lipid rich plaque   . Atrial fibrillation with RVR ([place]) 03/14/2019  . Atrial fibrillation with rapid ventricular response ([place]) 03/13/2019  . Chest pain 09/12/2018  . Chronic diastolic heart failure ([place]) 05/29/2018  . Hypertension 05/29/2018  . Non Hodgkin's lymphoma ([place]) 09/30/2017  . Heme positive stool 08/11/2017  . Chronic anticoagulation 07/05/2016  . CAD S/P percutaneous coronary angioplasty 07/05/2016  . Type 2 diabetes mellitus without complications ([place]) 10/93/2355  . PAF (paroxysmal atrial fibrillation) ([place]) 06/14/2016  . Chest pain in adult 06/14/2016  . Dyspnea on exertion 06/14/2016  . Allergic rhinitis 06/14/2016  . GERD (gastroesophageal reflux disease) 06/14/2016  . BPH (benign prostatic hyperplasia) 06/14/2016    Past Medical History:  Past Medical History:  Diagnosis Date  . Atrial fibrillation ([place])   . CAD (coronary artery disease)    a. s/p PCI in 1992 b. Coronary CT in 01/2018 showing extensive coronary calcification; FFR indeterminate --> medical management pursued at  that time as patient not interested in repeat cath  . Cancer [facility])    prostate  . Dyspnea   . Hypertension    Past Surgical History:  Past Surgical History:  Procedure Laterality Date  . CHEST TUBE INSERTION Left 04/23/2019   Procedure: INSERTION PLEURAL DRAINAGE CATHETER TO DRAIN LEFT PLEURAL EFFUSION;  Surgeon: Ivin Poot, MD;  Location: [place];  Service: Thoracic;  Laterality: Left;  . CLIPPING OF ATRIAL APPENDAGE N/A 03/16/2019   Procedure: CLIPPING OF LEFT  ATRIAL APPENDAGE - USING ATRICLIP SIZE 40;  Surgeon: Ivin Poot, MD;  Location: [place];  Service: Open Heart Surgery;  Laterality: N/A;  . COLONOSCOPY N/A 08/25/2017   Procedure: COLONOSCOPY;  Surgeon: Rogene Houston, MD;  [facility];  Service: Endoscopy;  Laterality: N/A;  . CORONARY ARTERY BYPASS GRAFT N/A 03/16/2019   Procedure: CORONARY ARTERY BYPASS GRAFTING (CABG), ON PUMP, TIMES THREE, USING LEFT INTERNAL MAMMARY ARTERY, RIGHT GREATER SAPHENOUS VEIN HARVESTED ENDOSCOPICALLY;  Surgeon: Ivin Poot, MD;  Location: [place];  Service: Open Heart Surgery;  Laterality: N/A;  swan only  . CYSTOSCOPY WITH FULGERATION N/A 04/30/2019   Procedure: CYSTOSCOPY WITH FULGERATION;  Surgeon: Irine Seal, MD;  Location: [place];  Service: Urology;  Laterality: N/A;  . HERNIA REPAIR    . IR FLUORO GUIDE CV LINE LEFT  04/23/2019  . IR US GUIDE VASC ACCESS LEFT  04/23/2019  . MAZE N/A 03/16/2019   Procedure: MAZE;  Surgeon: Ivin Poot, MD;  Location: [place];  Service: Open Heart Surgery;  Laterality: N/A;  . PLACEMENT OF IMPELLA LEFT VENTRICULAR ASSIST DEVICE  Right 03/27/2019   Procedure: Placement Of Impella Left Ventricular Assist Device using ABIOMED Impella 5.5 with SmartAssist Device.;  Surgeon: Wonda Olds, MD;  [facility];  Service: Thoracic;  Laterality: Right;  . PROSTATE SURGERY    . REMOVAL OF IMPELLA LEFT VENTRICULAR ASSIST DEVICE N/A 04/10/2019   Procedure: REMOVAL OF IMPELLA LEFT VENTRICULAR ASSIST  DEVICE WITH INSERTION OF RIGHT FEMORAL ARTERIAL LINE;  Surgeon: Ivin Poot, MD;  Location: [place];  Service: Open Heart Surgery;  Laterality: N/A;  . RIGHT/LEFT HEART CATH AND CORONARY ANGIOGRAPHY N/A 03/15/2019   Procedure: RIGHT/LEFT HEART CATH AND CORONARY ANGIOGRAPHY;  Surgeon: Burnell Blanks, MD;  Location: [place] CV LAB;  Service: Cardiovascular;  Laterality: N/A;  . TEE WITHOUT CARDIOVERSION N/A 03/16/2019   Procedure: TRANSESOPHAGEAL ECHOCARDIOGRAM (TEE);  Surgeon: Prescott Gum, Collier Salina, MD;  Location: [place];  Service: Open Heart Surgery;  Laterality: N/A;  . TRACHEOSTOMY TUBE PLACEMENT N/A 03/27/2019   Procedure: TRACHEOSTOMY placed using Shiley 8DCT Cuffed.;  Surgeon: Wonda Olds, MD;  [facility];  Service: Thoracic;  Laterality: N/A;  . TRANSURETHRAL RESECTION OF BLADDER NECK N/A 05/13/2019   Procedure: CYSTOSCOPY CLOT EVACUATION FULGRATION CYSTOGRAM AND INSTILLATION OF FORMALIN;  Surgeon: Lucas Mallow, MD;  Location: [place];  Service: Urology;  Laterality: N/A;    Assessment & Plan Clinical Impression: .Jesse Murphy is an 86 year old right-handed male with history of CAD, chronic atrial fibrillation on Xarelto as well as non-Hodgkin's lymphoma, chronic anemia, hypertension, diastolic congestive heart failure, prostate cancer.  Per chart review patient lives with spouse in [place].  Independent prior to admission.  Wife uses a cane.  Local children check on him as needed.  Presented to [facility]13/2019 with tachycardia as well as vague associated chest discomfort and palpitations.  Initially heart rate 200 bpm was given intravenous Cardizem.  Initial labs hemoglobin 11.3, platelets 187,000, sodium 138, potassium 3.9, creatinine 0.96 initial at bedtime troponin 32 with repeat values of 440, 926 and 585.  Chest x-ray showed no evidence of consolidation or effusion.  EKG showed atrial fibrillation with RVR heart rate 145 with no left bundle branch block.   DCCV was attempted in the ED but he remained in atrial fibrillation.  Echocardiogram showed ejection fraction of 35% as compared to previous echoes 55 to 60% 12/2017.  He was transferred to [facility] for further evaluation.  Cardiology service follow-up and patient with cardiac catheterization with LM/LAD and RCA disease.  Patient underwent CABG x3 left atrial Maze procedure 03/17/2019 per Dr. Darcey Nora.  Impella removed 04/10/2019 patient did require long-term intubation ultimately due to acute hypoxic respiratory failure tracheostomy tube completed 03/27/2019 he was decannulated 04/28/2019 stable nasal cannula.  Patient did have left pleural effusion necessitating need for Pleurx catheter 04/23/2019.  He developed a rash felt to be possibly drug-induced responded well to prednisone taper.  Nephrology consulted 03/29/2019 for AKI with creatinine 4.43 from baseline 1.7.  Patient ultimately required hemodialysis and currently remains on schedule Monday Wednesday Friday.  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  Palliative care has been consulted to establish goals of care.  Urology services consulted 04/04/2019 for urinary retention as well as hemorrhagic cystitis underwent cystogram 05/13/2019 a small to moderate amount of clot was evacuated.  Ongoing bouts of anemia transfused as needed latest hemoglobin 11, platelets 99,000 felt to be induced by heparin during hemodialysis with  HIT screen and serotonin release results pending.  Patient would have no more heparin for HD until HIT is ruled out.Marland Kitchen  He currently on a dysphagia #2 thin liquid diet as well as nasogastric tube for nutritional support.  Therapy evaluations completed and patient was admitted for a comprehensive rehab progra.     Patient transferred to CIR on 05/22/2019 .    Patient currently requires max with basic self-care skills secondary to  muscle weakness, decreased cardiorespiratoy endurance and decreased oxygen support, decreased memory and decreased balance strategies.  Prior to hospitalization, patient was fully independent.  Patient will benefit from skilled intervention to increase independence with basic self-care skills prior to discharge home with care partner.  Anticipate patient will require minimal physical assistance and follow up home health.  OT - End of Session Endurance Deficit: Yes Endurance Deficit Description: requires supplemental O2, fatigue OT Assessment Rehab Potential (ACUTE ONLY): Good OT Patient demonstrates impairments in the following area(s): Balance;Cognition;Endurance;Motor;Sensory;Nutrition OT Basic ADL's Functional Problem(s): Bathing;Dressing;Toileting OT Transfers Functional Problem(s): Toilet OT Additional Impairment(s): None OT Plan OT Intensity: Minimum of 1-2 x/day, 45 to 90 minutes OT Frequency: 5 out of 7 days OT Duration/Estimated Length of Stay: 06/09/19 - 18-20 days OT Treatment/Interventions: Balance/vestibular training;Cognitive remediation/compensation;Discharge planning;DME/adaptive equipment instruction;Functional mobility training;Psychosocial support;Patient/family education;Self Care/advanced ADL retraining;Therapeutic Activities;Therapeutic Exercise;UE/LE Strength taining/ROM;UE/LE Coordination activities OT Self Feeding Anticipated Outcome(s): Independent OT Basic Self-Care Anticipated Outcome(s): set up UB, min A LB OT Toileting Anticipated Outcome(s): min A OT Bathroom Transfers Anticipated Outcome(s): supervision to toilet OT Recommendation Patient destination: Home Follow Up Recommendations: Home health OT Equipment Recommended: To be determined   Skilled Therapeutic Intervention Pt seen for initial evaluation and initiation of ADL training but very limited as pt began feeling dizzy upon sitting and then he was nauseated.  Attempted sit to stand, but too fatigued.   Squat pivot to [facility] attempted but not safe as he could not lift up enough.  He may be able to use a stedy but did not try this session as he was not feeling well..  Bathed from EOB with mod A and donned hospital gown due to coretrack. Explained role of OT and discussed goals.  Pt in agreement. He is concerned about being asked to do too much during therapy.  Reviewed our rehab philosphy and ELOS with pt.    OT Evaluation Precautions/Restrictions  Precautions Precautions: Fall Restrictions Weight Bearing Restrictions: No Other Position/Activity Restrictions: no longer has sternal precautions  Vital Signs Therapy Vitals Temp: (!) 97.4 F (36.3 C) Pulse Rate: 75 Resp: 19 BP: (!) 127/51 Patient Position (if appropriate): Sitting Oxygen Therapy SpO2: 96 % O2 Device: Nasal Cannula O2 Flow Rate (L/min): 1 L/min Pain Pain Assessment Pain Score: 0-No pain Home Living/Prior Functioning Home Living Family/patient expects to be discharged to:: Private residence Living Arrangements: Spouse/significant other Available Help at Discharge: Family, Available 24 hours/day Type of Home: House Home Access: Stairs to enter [employer] of Steps: 4 Entrance Stairs-Rails: Right, Left, Can reach both Home Layout: One level Bathroom Shower/Tub: Tub/shower unit Additional Comments: lift chair  Lives With: Spouse Prior Function Level of Independence: Independent with basic ADLs, Independent with gait  Able to Take Stairs?: Yes Driving: Yes Vocation: Retired ADL  max A LB, set up UB, total toileting Vision Patient Visual Report: No change from baseline Vision Assessment?: No apparent visual deficits Perception  Perception: Within Functional Limits Praxis Praxis: Intact Cognition Overall Cognitive Status: No family/caregiver present to determine baseline cognitive functioning Arousal/Alertness: Probation officer  Level: Person;Place;Situation Person: Oriented Place:  Oriented(knew he was in the hospital but not what unit he was on) Situation: Oriented Year: 2021 Month: April Day of Week: Incorrect("i have no idea") Memory: Impaired Memory Impairment: Decreased short term memory;Decreased recall of new information Decreased Short Term Memory: Verbal basic;Functional basic Immediate Memory Recall: Sock;Blue;Bed Memory Recall Sock: With Cue Memory Recall Blue: Without Cue Memory Recall Bed: Not able to recall Attention: Sustained Sustained Attention: Appears intact Awareness: Appears intact Problem Solving: (to be evaluated further) Safety/Judgment: Appears intact Sensation Sensation Light Touch: Appears Intact Hot/Cold: Appears Intact Proprioception: Appears Intact Stereognosis: Appears Intact Additional Comments: sensation grossly intact, slightly diminished in feet/toes Coordination Gross Motor Movements are Fluid and Coordinated: No Fine Motor Movements are Fluid and Coordinated: No Coordination and Movement Description: coordination impaired B LEs secondary to generalized weakness Finger Nose Finger Test: too fatigued to particpate in test, feeling nauseated Motor  Motor Motor - Skilled Clinical Observations: generalized weakness Mobility    unable to rise to stand or transfer during OT eval Trunk/Postural Assessment  Postural Control Protective Responses: impaired  Balance Static Sitting Balance Static Sitting - Level of Assistance: 5: Stand by assistance Dynamic Sitting Balance Dynamic Sitting - Level of Assistance: 5: Stand by assistance;4: Min assist Extremity/Trunk Assessment RUE Assessment General Strength Comments: 4/5 LUE Assessment General Strength Comments: 4/5     Refer to Care Plan for Long Term Goals  Recommendations for other services: None    Discharge Criteria: Patient will be discharged from OT if patient refuses treatment 3 consecutive times without medical reason, if treatment goals not met, if there is  a change in medical status, if patient makes no progress towards goals or if patient is discharged from hospital.  The above assessment, treatment plan, treatment alternatives and goals were discussed and mutually agreed upon: by patient  [facility] 05/23/2019, 1:08 PM

## 2019-05-24 ENCOUNTER — Inpatient Hospital Stay (HOSPITAL_COMMUNITY): Payer: Medicare Other | Admitting: Speech Pathology

## 2019-05-24 ENCOUNTER — Inpatient Hospital Stay (HOSPITAL_COMMUNITY): Payer: Medicare Other

## 2019-05-24 DIAGNOSIS — R1312 Dysphagia, oropharyngeal phase: Secondary | ICD-10-CM

## 2019-05-24 DIAGNOSIS — N179 Acute kidney failure, unspecified: Secondary | ICD-10-CM

## 2019-05-24 DIAGNOSIS — N189 Chronic kidney disease, unspecified: Secondary | ICD-10-CM

## 2019-05-24 DIAGNOSIS — I480 Paroxysmal atrial fibrillation: Secondary | ICD-10-CM

## 2019-05-24 HISTORY — PX: IR FLUORO GUIDE CV LINE RIGHT: IMG2283

## 2019-05-24 LAB — GLUCOSE, CAPILLARY
Glucose-Capillary: 134 mg/dL — ABNORMAL HIGH (ref 70–99)
Glucose-Capillary: 103 mg/dL — ABNORMAL HIGH (ref 70–99)
Glucose-Capillary: 104 mg/dL — ABNORMAL HIGH (ref 70–99)
Glucose-Capillary: 105 mg/dL — ABNORMAL HIGH (ref 70–99)

## 2019-05-24 MED ORDER — CEFAZOLIN SODIUM-DEXTROSE 2-4 GM/100ML-% IV SOLN: 2.0000 g | INTRAVENOUS | Status: AC

## 2019-05-24 MED ORDER — LIDOCAINE VISCOUS HCL 2 % MT SOLN
6.0000 mL | Freq: Once | OROMUCOSAL | Status: AC
  Administered 2019-05-24: 6 mL via OROMUCOSAL
  Filled 2019-05-24: qty 15

## 2019-05-24 MED ORDER — CHLORHEXIDINE GLUCONATE CLOTH 2 % EX PADS
6.0000 | MEDICATED_PAD | Freq: Every day | CUTANEOUS | Status: DC
  Administered 2019-05-24: 6 via TOPICAL

## 2019-05-24 MED ORDER — ANTICOAGULANT SODIUM CITRATE 4% (200MG/5ML) IV SOLN
5.0000 mL | Freq: Once | Status: AC
  Administered 2019-05-28: 3.6 mL
  Filled 2019-05-24: qty 3.6

## 2019-05-24 MED ORDER — CHLORHEXIDINE GLUCONATE 4 % EX LIQD
CUTANEOUS | Status: AC
  Filled 2019-05-24: qty 15

## 2019-05-24 MED ORDER — LIDOCAINE HCL 1 % IJ SOLN
INTRAMUSCULAR | Status: DC | PRN
  Administered 2019-05-24: 5 mL

## 2019-05-24 MED ORDER — IOHEXOL 300 MG/ML  SOLN
10.0000 mL | Freq: Once | INTRAMUSCULAR | Status: AC | PRN
  Administered 2019-05-24: 13:00:00 10 mL

## 2019-05-24 MED ORDER — LIDOCAINE HCL 1 % IJ SOLN
INTRAMUSCULAR | Status: AC
  Filled 2019-05-24: qty 20

## 2019-05-24 NOTE — Procedures (Signed)
Interventional Radiology Procedure Note  Procedure:   Image guided exchange of non-functional left tunneled HD catheter. New 23cm tip to cuff placed.   Findings: -Easy aspiration of both hubs, with evacuation of dwell - The indwelling catheter has been withdrawn from the initial position at the cavoatrial jxn. - New catheter 23cm tip to cuff, at the cavoatrial jxn.  Aspirates well  Complications: None  Recommendations:  - Ok to use - Do not submerge - Routine line care   Signed,  Dulcy Fanny. Earleen Newport, DO

## 2019-05-24 NOTE — Progress Notes (Signed)
Orders received from Dr. Moshe Cipro to cancel HD treatment today and resume tomorrow.  Orders modified as given.

## 2019-05-24 NOTE — Progress Notes (Signed)
Occupational Therapy Session Note  Patient Details  Name: Jesse Murphy MRN: [id number] Date of Birth: October 16, 1928  Today's Date: 05/24/2019 OT Individual Time: 0700-0708 OT Individual Time Calculation (min): 8 min  and Today's Date: 05/24/2019 OT Missed Time: 71 Minutes Missed Time Reason: Patient fatigue;Patient ill (comment)(nausea/fatigue)   Short Term Goals: Week 1:  OT Short Term Goal 1 (Week 1): Pt will be able to rise to stand with min - mod A to prep for clothing management with toileting. OT Short Term Goal 2 (Week 1): Pt will be able to squat pivot to toilet or BSC with mod- max A. OT Short Term Goal 3 (Week 1): Pt will don shirt with set up. OT Short Term Goal 4 (Week 1): Pt will don pants with mod-max A.  Skilled Therapeutic Interventions/Progress Updates:    Pt received in bed with LPN at bedside. Pt provided with gentle support and encouragement to sit EOB/participate in bed level grooming routine. Pt refuses all stating, "I cant do all that, I barely have the energy to lift my arm." Provided gentle education on increasing nutritional intake to improve energy and improve participation which will improve function. Pt stating he feels nauseas all the time and doesn't want to eat. Exited session pt remaining in bed, exit alarm on and call light in reach  Therapy Documentation Precautions:  Precautions Precautions: Fall Precaution Booklet Issued: No Precaution Comments: slightly orthostatic Restrictions Weight Bearing Restrictions: No Other Position/Activity Restrictions: no longer has sternal precautions General:   Vital Signs: Therapy Vitals Temp: 98 F (36.7 C) Temp Source: Oral Pulse Rate: 73 Resp: 19 BP: (!) 121/48 Patient Position (if appropriate): Lying Oxygen Therapy SpO2: 98 % O2 Device: Nasal Cannula O2 Flow Rate (L/min): 1 L/min Pain:   ADL:   Vision   Perception    Praxis   Exercises:   Other Treatments:     Therapy/Group: Individual  Therapy  Tonny Branch 05/24/2019, 7:09 AM

## 2019-05-24 NOTE — Progress Notes (Signed)
[facility] NEPHROLOGY PROGRESS NOTE  Assessment/ Plan: Pt is a 86 y.o. yo male with history of CHF, cardiogenic shock requiring Impella support, status post CABG, A. fib, hemorrhagic cystitis followed by urologist, consulted for AKI now dialysis dependent.  #Acute kidney injury likely ATN due to cardiogenic shock/CABG surgery: Now dialysis dependent.  No sign of recovery so far. Concern for HIT however the test result was negative.  We will try to avoid heparin.  Needs outpatient HD arrangement for AKI. -Problem with the HD catheter despite of using TPA.  IR is planning to exchange tunneled catheter today.  He did not have complete full treatment therefore we will plan for dialysis today after new HD cath.  #Acute systolic heart failure/cardiogenic shock/status post Impella support which was removed.  Tolerating dialysis so far.  # Anemia due to critical illness/acute blood loss: Hb at goal.  # Secondary hyperparathyroidism: PTH 116. On phoslyra. Monitor phosphorus level.  # HTN/volume: BP and volume status looks acceptable.  UF during dialysis.  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  #Hyperkalemia: Resolved.  #Diffuse maculopapular rash around face and upper body: Concerned for allergic reaction to facial moisturizer, receiving steroid.  Better now.  Subjective: Seen and examined.  Poor blood flow with HD catheter yesterday despite of TPA use.  IR is planning for catheter change today.  Patient feels weak tired and mild shortness of breath.  No chest pain.  Denies nausea vomiting. Objective Vital signs in last 24 hours: Vitals:   05/23/19 1900 05/23/19 2007 05/23/19 2030 05/24/19 0454  BP: (!) 117/53 (!) 125/50 (!) 125/50 (!) 121/48  Pulse: 80 76 76 73  Resp:  18 18 19   Temp: 98.6 F (37 C) (!) 97.5 F (36.4 C) (!) 97.5 F (36.4 C) 98 F (36.7 C)  TempSrc: Oral  Oral Oral  SpO2: 98% 98% 98% 98%  Weight:    83.4 kg   Height:       Weight change: 0.8 kg  Intake/Output Summary (Last 24 hours) at 05/24/2019 1040 Last data filed at 05/24/2019 0830 Gross per 24 hour  Intake 600 ml  Output -20 ml  Net 620 ml       Labs: Basic Metabolic Panel: Recent Labs  Lab 05/22/19 0235 05/22/19 1651 05/23/19 1455  NA 133* 131* 133*  K 4.2 4.9 4.2  CL 95* 96* 96*  CO2 22 22 23   GLUCOSE 161* 220* 168*  BUN 135* 149* 147*  CREATININE 6.29* 7.12* 6.92*  CALCIUM 7.9* 7.7* 7.8*  PHOS 6.4* 6.3* 5.3*   Liver Function Tests: Recent Labs  Lab 05/22/19 0235 05/22/19 1651 05/23/19 1455  ALBUMIN 2.0* 2.1* 2.2*   No results for input(s): LIPASE, AMYLASE in the last 168 hours. No results for input(s): AMMONIA in the last 168 hours. CBC: Recent Labs  Lab 05/20/19 0257 05/20/19 0257 05/21/19 0135 05/21/19 0135 05/22/19 0235 05/22/19 1651 05/23/19 1455  WBC 7.1   < > 7.7   < > 8.7 5.2 8.9  HGB 11.0*   < > 11.0*   < > 11.0* 11.1* 10.5*  HCT 35.6*   < > 35.1*   < > 35.6* 35.7* 33.4*  MCV 92.7  --  92.4  --  93.7 92.5 92.3  PLT 87*   < > 91*   < > 99* 101* 110*   < > = values in this interval not displayed.   Cardiac Enzymes: No results for input(s): CKTOTAL, CKMB, CKMBINDEX, TROPONINI in  the last 168 hours. CBG: Recent Labs  Lab 05/23/19 0637 05/23/19 1148 05/23/19 1851 05/23/19 2048 05/24/19 0613  GLUCAP 167* 168* 145* 166* 134*    Iron Studies: No results for input(s): IRON, TIBC, TRANSFERRIN, FERRITIN in the last 72 hours. Studies/Results: No results found.  Medications: Infusions: . anticoagulant sodium citrate    . feeding supplement (NEPRO CARB STEADY) Stopped (05/24/19 1012)    Scheduled Medications: . aspirin  81 mg Oral Daily  . calcium acetate (Phos Binder)  667 mg Per Tube TID WC  . Chlorhexidine Gluconate Cloth  6 each Topical BID  . Chlorhexidine Gluconate Cloth  6 each Topical Q0600  . famotidine  10 mg Oral Daily  . feeding supplement (PRO-STAT SUGAR FREE 64)  60 mL  Per Tube BID  . free water  245 mL Per Tube Q4H  . Gerhardt's butt cream   Topical BID  . hydrocortisone   Rectal BID  . hydrocortisone  25 mg Rectal BID  . hydrocortisone cream   Topical BID  . insulin aspart  0-15 Units Subcutaneous TID WC  . multivitamin  1 tablet Per Tube QHS  . ondansetron  4 mg Oral Once  . simethicone  80 mg Oral TID    have reviewed scheduled and prn medications.  Physical Exam: General: Sitting on bed comfortable, not in distress. Heart:RRR, s1s2 nl Lungs basal rhonchi, no crackle Abdomen:soft, Non-tender, non-distended Extremities: Trace LE edema Dialysis Access: L IJ TDC for the access.  Emani Morad Prasad Isayah Ignasiak 05/24/2019,10:40 AM  LOS: 2 days  Pager: [phone]

## 2019-05-24 NOTE — Progress Notes (Signed)
HD called RN and reported that pt will be not going to HD today and will go tomorrow.   Isla Pence, RN

## 2019-05-24 NOTE — Progress Notes (Signed)
Team Conference Report to Patient/Family  Team Conference discussion was reviewed with the patient's son, including goals, any changes in plan of care and target discharge date.  Patient's son expressed understanding and is in agreement.  The patient has a target discharge date of 06/09/19. Reviewed with son weekly team conferences and items to be addressed including nausea, poor appetite, rash, wounds, skin care and Pleur X chest tube. Goals set for Minimal assistance for discharge. Son stated an understanding of information reviewed.  [name] 05/24/2019, 1:19 PM

## 2019-05-24 NOTE — Progress Notes (Signed)
Physical Therapy Note Missed visit (75 min)  Patient Details  Name: Jesse Murphy MRN: [id number] Date of Birth: Sep 24, 1928 Today's Date: 05/24/2019    Pt declined rx, stated he just vomited and was too weak and sick at this time to tolerate any activity. Callie Fielding, PT    Jerrilyn Cairo 05/24/2019, 2:13 PM

## 2019-05-24 NOTE — Progress Notes (Signed)
[facility] PHYSICAL MEDICINE & REHABILITATION PROGRESS NOTE   Subjective/Complaints: NGT clogged. HD catheter clotted. C/o fatigue, nausea  ROS: Patient denies fever, rash, sore throat, blurred vision,   vomiting, diarrhea, cough, shortness of breath or chest pain,   headache, or mood change.    Objective:   No results found. Recent Labs    05/22/19 1651 05/23/19 1455  WBC 5.2 8.9  HGB 11.1* 10.5*  HCT 35.7* 33.4*  PLT 101* 110*   Recent Labs    05/22/19 1651 05/23/19 1455  NA 131* 133*  K 4.9 4.2  CL 96* 96*  CO2 22 23  GLUCOSE 220* 168*  BUN 149* 147*  CREATININE 7.12* 6.92*  CALCIUM 7.7* 7.8*    Intake/Output Summary (Last 24 hours) at 05/24/2019 1206 Last data filed at 05/24/2019 0830 Gross per 24 hour  Intake 600 ml  Output -20 ml  Net 620 ml     Physical Exam: Vital Signs Blood pressure (!) 121/48, pulse 73, temperature 98 F (36.7 C), temperature source Oral, resp. rate 19, height 5\' 8"  (1.727 m), weight 83.4 kg, SpO2 98 %.   General: alert, no distress HEENT: Head is normocephalic, atraumatic, PERRLA, EOMI, sclera anicteric, oral mucosa pink and moist, dentition intact, ext ear canals clear, NGT  Neck: Supple without JVD or lymphadenopathy Heart: Reg rate and rhythm. No murmurs rubs or gallops Chest: CTA bilaterally without wheezes, rales, or rhonchi; no distress, HD catheter in place Abdomen: Soft, non-tender, non-distended, bowel sounds positive. Extremities: No clubbing, cyanosis, or edema. Pulses are 2+ Skin: diffuse macular rash, matured.  Neuro: Pt is cognitively appropriate with normal insight, memory, and awareness. Cranial nerves 2-12 are intact. Sensory exam is normal. Reflexes are 2+ in all 4's. Fine motor coordination is intact. No tremors. Motor function is grossly 3-4/5 prox to distal.  Musculoskeletal: Full ROM, No pain with AROM or PROM in the neck, trunk, or extremities. Posture appropriate Psych: flat    Assessment/Plan: 1.  Functional deficits secondary to debility which require 3+ hours per day of interdisciplinary therapy in a comprehensive inpatient rehab setting.  Physiatrist is providing close team supervision and 24 hour management of active medical problems listed below.  Physiatrist and rehab team continue to assess barriers to discharge/monitor patient progress toward functional and medical goals  Care Tool:  Bathing    Body parts bathed by patient: Right arm, Left arm, Chest, Abdomen, Right upper leg, Left upper leg   Body parts bathed by helper: Right lower leg, Left lower leg, Buttocks, Front perineal area     Bathing assist Assist Level: Moderate Assistance - Patient 50 - 74%     Upper Body Dressing/Undressing Upper body dressing   What is the patient wearing?: Hospital gown only    Upper body assist Assist Level: Minimal Assistance - Patient > 75%    Lower Body Dressing/Undressing Lower body dressing      What is the patient wearing?: Incontinence brief     Lower body assist Assist for lower body dressing: Total Assistance - Patient < 25%     Toileting Toileting    Toileting assist Assist for toileting: Total Assistance - Patient < 25%     Transfers Chair/bed transfer  Transfers assist  Chair/bed transfer activity did not occur: Safety/medical concerns(nausea/dizziness/fatigue)        Locomotion Ambulation   Ambulation assist   Ambulation activity did not occur: Safety/medical concerns(nausea/dizziness/fatigue)          Walk 10 feet activity  Assist  Walk 10 feet activity did not occur: Safety/medical concerns        Walk 50 feet activity   Assist Walk 50 feet with 2 turns activity did not occur: Safety/medical concerns         Walk 150 feet activity   Assist Walk 150 feet activity did not occur: Safety/medical concerns         Walk 10 feet on uneven surface  activity   Assist Walk 10 feet on uneven surfaces activity did not  occur: Safety/medical concerns         Wheelchair     Assist Will patient use wheelchair at discharge?: No(sternal precautions)             Wheelchair 50 feet with 2 turns activity    Assist            Wheelchair 150 feet activity     Assist          Blood pressure (!) 121/48, pulse 73, temperature 98 F (36.7 C), temperature source Oral, resp. rate 19, height 5\' 8"  (1.727 m), weight 83.4 kg, SpO2 98 %.  Medical Problem List and Plan: 1.Debilitysecondary to CAD status post CABG 03/15/2019. Impella device removed 04/10/2019. Sternal precautions -patient maynotshowerdue to L IJ for HD- unless way to cover -ELOS/Goals: 2-2.5 weeks minimum- goals Supervision to CGA    2. Antithrombotics: -DVT/anticoagulation:SCDs- due to hematuria -antiplatelet therapy: Aspirin 81 mg daily 3. Pain Management:Oxycodone as needed 4. Mood:Provide emotional support -antipsychotic agents: N/A 5. Neuropsych: This patientiscapable of making decisions on hisown behalf. 6. Skin/Wound Care:Routine skin checks 7. Fluids/Electrolytes/Nutrition:continues on TF, D2 diet but no po intake at present 8. Acute systolic congestive heart failure/cardiogenic shock. Follow-up cardiology services 9. Acute hypoxic respiratory failure with Pseudomonas pneumonia. Status post tracheostomy 03/27/2019. Decannulated 04/28/2019. 10.Acute on chronic anemia/thrombocytopenia. Follow-up CBC.Serotonin releaseassaypending. No more heparin for HD for now 11. Dysphagia. Dysphagia #2 thin liquids. Follow-up speech therapy.Nasogastric tube/cortrakfor nutritional support.   Dietary follow-up- see dietary note dated 4/6 for additional information.   -PEG not safe option as per cardiothoracic surgery team. Goal is to get nausea under better control so he can tolerate feeds better.   4/8: still eating next to nothing 12. AKI/CKDwith new  ESRD due to shock/ATN?-. Patient has been transitioned to dialysis and being sent to OP HD after CIR, so is chronicand followed by renal services. Tunneled L IJcatheter placed 04/24/2019--IR saw today for exchange scheduled. 13. Hematuria. Cystoscopy completed with evacuation of clot. No further bleeding episodes per notes, intermittent bleeding. Not significant today. observe. 14. Left pleural effusion. Status post Pleurx catheter 04/23/2019.PATIENT WILL KEEP PLEUREX CATHETER FOR NOW PER CVTS.Currently being drained Mondaysand Thursdays 15. Diffuse drug rashversus contact dermatitis. Appears to be resolving. Steroid taper as directed and completed- itching improved- con't supportive care. Benadryl prn. Added Sarna lotion. 16.Rectal bleeding. 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 Follow-up GI with conservative care no further bleeding reported. 17. PAF. Patient remains normal sinus rhythm. Currently off amiodarone. No plan to resume any anticoagulation at this time until hematuria fully resolved. 18. Nausea: prn Zofran. Consider Compazine if does not help.   LOS: 2 days A FACE TO FACE EVALUATION WAS PERFORMED  Meredith Staggers 05/24/2019, 12:06 PM

## 2019-05-24 NOTE — Progress Notes (Signed)
Speech Language Pathology Daily Session Note  Patient Details  Name: Jesse Murphy MRN: [id number] Date of Birth: 1928-07-01  Today's Date: 05/24/2019 SLP Individual Time: 2395-3202 SLP Individual Time Calculation (min): 25 min  Short Term Goals: Week 1: SLP Short Term Goal 1 (Week 1): Pt will consume current diet with minimal s/sx aspiration and efficient mastication and oral clearance with Min A verbal/visual cues for use of compensatory strategies SLP Short Term Goal 2 (Week 1): Pt will sustain attention to functional tasks with Min A verbal/visual cues for reidrection SLP Short Term Goal 3 (Week 1): Pt will use external aids to orient to date with Min A verbal/visual cues SLP Short Term Goal 4 (Week 1): Pt will demonstrate ability to problem solve basic familiar tasks with Min A verbal/visual cues  Skilled Therapeutic Interventions: Skilled treatment session focused on cognitive and dysphagia goals. SLP facilitated session by maximizing positioning for PO intake. Patient declined trials of food but was agreeable to sips of thin liquids and broth. Patient consumed sips with intermittent overt s/s of aspiration, suspect due to incomplete chin tuck requiring Min verbal cues. Recommend patient continue current diet. Patient was oriented X 3 and demonstrated increased arousal and alertness compared to yesterday. RN present attempting to clear clogged cortrack. Patient also became nauseous with expectoration of mucous. Due to RN care and feeling ill, patient missed 35 minutes of session. Patient left upright in bed with alarm on and RN present. Continue with current plan of care.      Pain No/Denies Pain   Therapy/Group: Individual Therapy  Verner Mccrone 05/24/2019, 12:32 PM

## 2019-05-24 NOTE — Progress Notes (Signed)
Has returned to floor.

## 2019-05-24 NOTE — Progress Notes (Signed)
Unable to administer medications via tube due to obstruction. Attempted multiple times to flush, draw back, and use air without dislodging obstruction. Also removed Lopez valve and cleaned. Tried again and was still unable to flush. Coretrak team will be available tomorrow. PA notified. Called IR another cortrak can be placed today at 12:30

## 2019-05-24 NOTE — Progress Notes (Addendum)
Patient ID: Jesse Murphy, male   DOB: [birth year], 86 y.o.   MRN: [id number]    Advanced Heart Failure Rounding Note  PCP-Cardiologist: Jesse Sable, MD   Subjective:    Now in CIR.   Continues w/ HD. Held today due to HD catheter clotting. Planning HD cath exchange by IR. Will resume HD tomorrow.   Wt stable since transfer.   Hgb stable.   Continues w/ diffuse rash, although somewhat improved.   Appetite remains poor. Continues w/ TFs.   Objective:   Weight Range: 83.4 kg Body mass index is 27.96 kg/m.   Vital Signs:   Temp:  [97.5 F (36.4 C)-98.6 F (37 C)] 97.6 F (36.4 C) (04/08 1319) Pulse Rate:  [73-84] 73 (04/08 1319) Resp:  [16-19] 18 (04/08 1319) BP: (80-125)/(46-56) 118/46 (04/08 1319) SpO2:  [98 %-100 %] 100 % (04/08 1319) Weight:  [83.4 kg-83.8 kg] 83.4 kg (04/08 0454) Last BM Date: 05/24/19  Weight change: Filed Weights   05/23/19 1450 05/23/19 1757 05/24/19 0454  Weight: 83.8 kg 83.8 kg 83.4 kg    Intake/Output:   Intake/Output Summary (Last 24 hours) at 05/24/2019 1555 Last data filed at 05/24/2019 1300 Gross per 24 hour  Intake 600 ml  Output -20 ml  Net 620 ml     Physical Exam   General:  elderly WM  No resp difficulty, + diffuse erythematous rash on face, extremities and chest   HEENT: normal + NGT  Neck: supple. no JVD. Carotids 2+ bilat; no bruits. No lymphadenopathy or thryomegaly appreciated. + left sided tunneled HD cath Cor: PMI nondisplaced. Regular rate & rhythm. No rubs, gallops or murmurs. + HD cath Lungs: clear no wheeze Abdomen: soft, nontender, nondistended. No hepatosplenomegaly. No bruits or masses. Good bowel sounds. Extremities: no cyanosis, clubbing, rash, trace bilateral LE edema Neuro: alert & oriented x 3, cranial nerves grossly intact. moves all 4 extremities w/o difficulty. Affect pleasant   Telemetry   N/A   Labs    CBC Recent Labs    05/22/19 1651 05/23/19 1455  WBC 5.2 8.9  HGB 11.1* 10.5*    HCT 35.7* 33.4*  MCV 92.5 92.3  PLT 101* 419*   Basic Metabolic Panel Recent Labs    05/22/19 0235 05/22/19 0235 05/22/19 1651 05/23/19 1455  NA 133*   < > 131* 133*  K 4.2   < > 4.9 4.2  CL 95*   < > 96* 96*  CO2 22   < > 22 23  GLUCOSE 161*   < > 220* 168*  BUN 135*   < > 149* 147*  CREATININE 6.29*   < > 7.12* 6.92*  CALCIUM 7.9*   < > 7.7* 7.8*  MG 2.2  --   --   --   PHOS 6.4*   < > 6.3* 5.3*   < > = values in this interval not displayed.   Liver Function Tests Recent Labs    05/22/19 1651 05/23/19 1455  ALBUMIN 2.1* 2.2*   No results for input(s): LIPASE, AMYLASE in the last 72 hours. Cardiac Enzymes No results for input(s): CKTOTAL, CKMB, CKMBINDEX, TROPONINI in the last 72 hours.  BNP: BNP (last 3 results) Recent Labs    03/22/19 0401 03/26/19 0626 03/27/19 1255  BNP 340.4* 687.5* 800.4*    ProBNP (last 3 results) No results for input(s): PROBNP in the last 8760 hours.   D-Dimer No results for input(s): DDIMER in the last 72 hours. Hemoglobin A1C No results  for input(s): HGBA1C in the last 72 hours. Fasting Lipid Panel No results for input(s): CHOL, HDL, LDLCALC, TRIG, CHOLHDL, LDLDIRECT in the last 72 hours. Thyroid Function Tests No results for input(s): TSH, T4TOTAL, T3FREE, THYROIDAB in the last 72 hours.  Invalid input(s): FREET3  Other results:   Imaging    DG Abd 1 View  Result Date: 05/24/2019 CLINICAL DATA:  Feeding tube replacement. EXAM: ABDOMEN - 1 VIEW COMPARISON:  None. FINDINGS: Feeding tube tip in the junction of the first and second portions of the duodenum. IMPRESSION: Feeding tube well positioned. Electronically Signed   By: Jesse Murphy M.D.   On: 05/24/2019 14:39     Medications:     Scheduled Medications: . aspirin  81 mg Oral Daily  . calcium acetate (Phos Binder)  667 mg Per Tube TID WC  . chlorhexidine      . Chlorhexidine Gluconate Cloth  6 each Topical BID  . Chlorhexidine Gluconate Cloth  6 each  Topical Q0600  . Chlorhexidine Gluconate Cloth  6 each Topical Q0600  . famotidine  10 mg Oral Daily  . feeding supplement (PRO-STAT SUGAR FREE 64)  60 mL Per Tube BID  . free water  245 mL Per Tube Q4H  . Gerhardt's butt cream   Topical BID  . hydrocortisone   Rectal BID  . hydrocortisone  25 mg Rectal BID  . hydrocortisone cream   Topical BID  . insulin aspart  0-15 Units Subcutaneous TID WC  . lidocaine      . multivitamin  1 tablet Per Tube QHS  . ondansetron  4 mg Oral Once  . simethicone  80 mg Oral TID    Infusions: . anticoagulant sodium citrate    . anticoagulant sodium citrate    .  ceFAZolin (ANCEF) IV    . feeding supplement (NEPRO CARB STEADY) 50 mL/hr at 05/24/19 1550    PRN Medications: oxyCODONE **AND** acetaminophen, camphor-menthol, diphenhydrAMINE, lidocaine, ondansetron, sodium chloride    Assessment/Plan   1. Acute systolic HF -> Cardiogenic shock - post-op echo on 03/20/19 EF 25-30% (pre-op 30-35%. In 12/2017 EF normal) - Required Impella support post CABG. Impella removed 2/23. Post-op TEE EF 40% - Has developed ESRD. Volume controlled through HD - Volume stable. Continue HD per nephrology  - Continue iHD on TThSat Schedule. Now off midodrine due to rash (unclear which medication caused).  - unable to tolerate GDMT with low BP and ESRD  2. CAD s/p CABG this admit (LIMA->LAD, SVG->OM, SVG->RCA on 03/15/19) - No s/s ischemia - Continue statin.  - Off ASA due to hematuria and BRBPR  3. Acute hypoxic respiratory failure with Pseudomonas PNA - s/p trach on 03/27/19 - completed meropenem for pseudomonas PNA - trach aspirate 3/5 w/ regrowth of Pseudomonas again.   - Completed antibiotic course.  - Trach decannulated 3/13. Stable on [place] - Stable no change   4. PAF - s/p MAZE and LAA occlusion - Remains in NSR. Off amio due to rash (unclear which agent it was) - Can reconsider AC in near future as bladder issues stabilize.  - no change for now  5. AKI  -> ESRD - due to shock/ATN.  - Tunneled cath placed 3/9. Clotted 4/8 s/p exchange by IR - He is tolerating iHD. Midodrine now off.  - Vein mapping complete. Nephrology considering permanent access but no formal plans   6. Hematuria -Cystoscopy 3/15 with fulgeration - Back to OR 3/28 with fulgeration and formalin installation - monitor closely  for recurrence. Follow CBC  7. Complete heart block with profound symptomatic bradycardia - likely vagal in nature - s/p emergent TVP 3/25 - resolved TVP out.   8. Debility, severe - Continue CIR. Appreciate their assistance   9. F/E/N - Poor po intake.  - Continues w/ Cor-trak for TFs - Encourage po      10. Left Pleural Effusion  - moderate to large. - s/p PleurX on 3/8.  - TCTS managing. Draining M/Th  10. Rash - Initially w/ diffuse rash on extremities and torso w/ appearance of drug induced rash. - Steroids started 3/21 w/ improvement/ resolution. Now off prednisone - redeveloped diffuse rash 4/6 after addition of Reglan and atorvastatin. Both agents discontinued. Appears to be slowly improving.     11. Anemia: - Resolved - hgb 9.0 -> 7.9 -> 10.9 -> 11.2->11.4->10.7 -> 11.0- >10.5  12. Dysphagia and BRBPR - ? Possible esophagitis +/- radiation proctitis - GI has seen.Treated with IV fluconazole.  - Resolved  13. Hyperkalemia - no lab draw today. Nephrology following - lokelma as needed on non HD days.    Jesse Jester, PA-C  3:55 PM   Patient seen and examined with the above-signed Advanced Practice Provider and/or Housestaff. I personally reviewed laboratory data, imaging studies and relevant notes. I independently examined the patient and formulated the important aspects of the plan. I have edited the note to reflect any of my changes or salient points. I have personally discussed the plan with the patient and/or family.  Remains stable from cardiac perspective. In NSR. Volume status ok. HD catheter clotted and  will be replaced. No further hematuria. I suspect we can start apixaban soon at 2.5 bid. I will d/w Dr. Prescott Gum. Appreciate CIR care.   Glori Bickers, MD  7:54 PM

## 2019-05-24 NOTE — Progress Notes (Signed)
Pt back in room with new cortrak. Talked to PA and coming up to see son in room for consent to exchange port to left chest and then pt will go to dialysis once returns to floor.

## 2019-05-24 NOTE — Progress Notes (Signed)
Pt in IR getting coretrak

## 2019-05-24 NOTE — Progress Notes (Signed)
   Patient Status: [facility] - In-pt  Assessment and Plan:  Tunn HD cathter placed in IR 04/23/19 Clotted  ______________________________________________________________________   History of Present Illness: Jesse Murphy is a 86 y.o. male   Hx CHF; cardiogenic shock; post CABG; Afib AKI likely ATN secondary cardiogenic shock Now dialysis dependent- no sign of recovery IR placed Tunn HD cath 04/23/19 Dr Carolin Sicks did attempt TPA-- still not working Unable to have dialysis yesterday Request now for exchange   Allergies and medications reviewed.   Review of Systems: A 12 point ROS discussed and pertinent positives are indicated in the HPI above.  All other systems are negative.   Vital Signs: BP (!) 121/48 (BP Location: Right Arm)   Pulse 73   Temp 98 F (36.7 C) (Oral)   Resp 19   Ht 5\' 8"  (1.727 m)   Wt 183 lb 13.8 oz (83.4 kg)   SpO2 98%   BMI 27.96 kg/m   Physical Exam Abdominal:     Comments: Pt is actively vomiting Very nauseas Will call wife for permission for exchange  Skin:    General: Skin is warm and dry.     Comments: Rt IJ tunelled cath is intact No sign of infection  Neurological:     Mental Status: He is alert.      Imaging reviewed.   Labs:  COAGS: Recent Labs    09/12/18 0936 03/14/19 1119 03/15/19 1453 03/16/19 0444 03/16/19 1722 03/27/19 1255 03/28/19 0417 04/03/19 0406 04/04/19 0355 04/05/19 0505 04/06/19 0404  INR 1.8*  --  1.2  --  1.6* 1.7*  --   --   --   --   --   APTT 44*   < >  --    < > 38*  --    < > 90* 99* 45* 55*   < > = values in this interval not displayed.    BMP: Recent Labs    05/21/19 0135 05/22/19 0235 05/22/19 1651 05/23/19 1455  NA 132* 133* 131* 133*  K 4.4 4.2 4.9 4.2  CL 95* 95* 96* 96*  CO2 25 22 22 23   GLUCOSE 109* 161* 220* 168*  BUN 114* 135* 149* 147*  CALCIUM 7.8* 7.9* 7.7* 7.8*  CREATININE 5.37* 6.29* 7.12* 6.92*  GFRNONAA 9* 7* 6* 6*  GFRAA 10* 8* 7* 7*   Tunneled HD cath placed in  IR 04/23/19 Not functioning well---tpa not successful Scheduled for exchange in IR   Electronically Signed: Lavonia Drafts, PA-C 05/24/2019, 9:16 AM   I spent a total of 15 minutes in face to face in clinical consultation, greater than 50% of which was counseling/coordinating care for venous access.

## 2019-05-25 ENCOUNTER — Inpatient Hospital Stay (HOSPITAL_COMMUNITY): Payer: Medicare Other | Admitting: Occupational Therapy

## 2019-05-25 ENCOUNTER — Inpatient Hospital Stay (HOSPITAL_COMMUNITY): Payer: Medicare Other | Admitting: Speech Pathology

## 2019-05-25 ENCOUNTER — Inpatient Hospital Stay (HOSPITAL_COMMUNITY): Payer: Medicare Other | Admitting: Physical Therapy

## 2019-05-25 LAB — GLUCOSE, CAPILLARY
Glucose-Capillary: 145 mg/dL — ABNORMAL HIGH (ref 70–99)
Glucose-Capillary: 151 mg/dL — ABNORMAL HIGH (ref 70–99)
Glucose-Capillary: 115 mg/dL — ABNORMAL HIGH (ref 70–99)
Glucose-Capillary: 142 mg/dL — ABNORMAL HIGH (ref 70–99)

## 2019-05-25 LAB — BASIC METABOLIC PANEL
Anion gap: 11 (ref 5–15)
BUN: 96 mg/dL — ABNORMAL HIGH (ref 8–23)
CO2: 25 mmol/L (ref 22–32)
Calcium: 7.6 mg/dL — ABNORMAL LOW (ref 8.9–10.3)
Chloride: 90 mmol/L — ABNORMAL LOW (ref 98–111)
Creatinine, Ser: 5.54 mg/dL — ABNORMAL HIGH (ref 0.61–1.24)
GFR calc Af Amer: 10 mL/min — ABNORMAL LOW (ref >60)
GFR calc non Af Amer: 8 mL/min — ABNORMAL LOW (ref >60)
Glucose, Bld: 159 mg/dL — ABNORMAL HIGH (ref 70–99)
Potassium: 3.8 mmol/L (ref 3.5–5.1)
Sodium: 126 mmol/L — ABNORMAL LOW (ref 135–145)

## 2019-05-25 MED ORDER — PROMETHAZINE HCL 12.5 MG PO TABS
12.5000 mg | ORAL_TABLET | Freq: Four times a day (QID) | ORAL | Status: DC | PRN
  Administered 2019-05-26: 12.5 mg
  Filled 2019-05-25 (×2): qty 1

## 2019-05-25 MED ORDER — ALBUMIN HUMAN 25 % IV SOLN
INTRAVENOUS | Status: AC
  Filled 2019-05-25: qty 50

## 2019-05-25 NOTE — Plan of Care (Signed)
Pt's plan of care adjusted to 15/7 after speaking with care team and discussed with MD in team conference as pt currently unable to tolerate current therapy schedule with OT, PT, and SLP.   

## 2019-05-25 NOTE — Progress Notes (Signed)
Speech Language Pathology Daily Session Note  Patient Details  Name: Jesse Murphy MRN: [id number] Date of Birth: [birth year]  Today's Date: 05/25/2019 SLP Individual Time: 6579-0383 SLP Individual Time Calculation (min): 45 min  Short Term Goals: Week 1: SLP Short Term Goal 1 (Week 1): Pt will consume current diet with minimal s/sx aspiration and efficient mastication and oral clearance with Min A verbal/visual cues for use of compensatory strategies SLP Short Term Goal 2 (Week 1): Pt will sustain attention to functional tasks with Min A verbal/visual cues for reidrection SLP Short Term Goal 3 (Week 1): Pt will use external aids to orient to date with Min A verbal/visual cues SLP Short Term Goal 4 (Week 1): Pt will demonstrate ability to problem solve basic familiar tasks with Min A verbal/visual cues  Skilled Therapeutic Interventions: Skilled treatment session focused on dysphagia and cognitive goals. Patient declined any PO intake, therefore, SLP facilitated a conversation with the patient and his son that focused on maximizing PO intake and overall interest. Patient reports that the smell/look of food makes him nauseous at times, therefore, recommend patient downgraded to a full liquid diet in hopes of maximizing desire for PO intake. SLP also facilitated session by providing supervision verbal cues for problem solving during a basic money management task and Mod verbal cues for problem solving during a basic medication management task. Patient required Mod verbal cues for attention to task due to dizziness with nausea and expectoration of phlegm. Patient left upright in bed with alarm on and all needs within reach. Continue with current plan of care.       Pain No/Denies Pain   Therapy/Group: Individual Therapy  Knowledge [place]andon 05/25/2019, 3:19 PM

## 2019-05-25 NOTE — Progress Notes (Signed)
Physical Therapy Session Note  Patient Details  Name: Jesse Murphy MRN: [id number] Date of Birth: [birth year]  Today's Date: 05/25/2019 PT Individual Time: 0820-0903 PT Individual Time Calculation (min): 43 min   and  Today's Date: 05/25/2019 PT Missed Time: 9 Minutes Missed Time Reason: Patient fatigue;Patient ill (Comment)(nausea)  Short Term Goals: Week 1:  PT Short Term Goal 1 (Week 1): Pt will perform bed<>chair transfer min assist with LRAD PT Short Term Goal 2 (Week 1): Pt will ambulate x 10 ft with LRAD and min assist PT Short Term Goal 3 (Week 1): Pt will tolerate sitting up in w/c or recliner OOB >1 hr PT Short Term Goal 4 (Week 1): Pt will perform sit<>stand with LRAD and min assist, maintaining sternal precuations  Skilled Therapeutic Interventions/Progress Updates:    Pt received supine, asleep in bed but awakens to verbal stimulus. Pt reports falling asleep yesterday evening then getting woken up around 8PM last night and that he was not able to get back to sleep after that so he is tired today. Despite this, pt states "I don't want to but I will try" when inquired to participate in therapy session. Pt on 1L of O2 via nasal cannula during session - SpO2 WNL ~97%. Supine>sitting L EOB with mod assist primarily for rotating pelvis and trunk upright with max cuing for sequencing. Upon sitting EOB, pt reports onset of "dizziness" and "lightheadedness" - vitals assessed: BP 126/50 (MAP 69), HR 79bpm, SpO2 97%. Pt has minimal trunk sway closing eyes intermittently requiring verbal stimulus to increase alertness with CGA for steadying. RN arriving for medication administration. Pt tolerated sitting EOB ~17minutes with close supervision and intermittent CGA for steadying while performing the following B LE exercises: R LE 2x15reps long arc quads, L LE x15reps long arc quads, B LE x20reps ankle PF/DF. Pt unable to complete 2nd set of some exercises due to becoming nauseous spitting up when RN  started administering medications via cortrak. Despite significant encouragement and education regarding importance of OOB, upright sitting with options of recliner or getting him a TIS w/c pt continued to defer OOB mobility at this time due to feeling nauseous and continued light "dizziness" though improved compared to when first sitting EOB. Sit>supine with mod assist for B LE management back up into the bed with cuing for sequencing. Dr. Dagoberto Ligas arrived for morning assessment and notified of pt's nausea and sitting EOB activity tolerance. Therapist spoke with Dr. Dagoberto Ligas and confirmed that pt no longer has sternal precautions and does not have any PWBing precautions in UEs. Pt left supine in bed with B LEs elevated for heel pressure relief and B UEs elevated for edema management with needs in reach, lines intact, and bed alarm on - notified RN and NT to keep extremities elevated for edema management and pressure relief as well as need for hypoallergenic sheets due to rash (sign placed above pt's bed). Missed 32 minutes of skilled physical therapy.   Therapy Documentation Precautions:  Precautions Precautions: Fall Precaution Booklet Issued: Yes (comment) Precaution Comments: monitor for orthostatic hypotension Restrictions Weight Bearing Restrictions: No RUE Weight Bearing: Weight bearing as tolerated(no WBing restriction) RUE Partial Weight Bearing Percentage or Pounds: No restrictions Other Position/Activity Restrictions: no longer has sternal precautions or UE PWB precautions - confirmed with Dr. Dagoberto Ligas on 4/9  Pain: No reports of pain but does have nausea.  Therapy/Group: Individual Therapy  Tawana Scale, PT, DPT 05/25/2019, 8:04 AM

## 2019-05-25 NOTE — IPOC Note (Signed)
Overall Plan of Care [facility]) Patient Details Name: JACORIAN GOLASZEWSKI MRN: [id number] DOB: November 09, 1928  Admitting Diagnosis: Debility  Hospital Problems: Principal Problem:   Debility Active Problems:   Pressure ulcer     Functional Problem List: Nursing Bladder, Bowel, Edema, Endurance, Medication Management, Motor, Nutrition, Pain, Safety, Skin Integrity  PT Balance, Safety, Behavior, Sensory, Edema, Endurance, Motor  OT Balance, Cognition, Endurance, Motor, Sensory, Nutrition  SLP Cognition, Nutrition  TR         Basic ADL's: OT Bathing, Dressing, Toileting     Advanced  ADL's: OT       Transfers: PT Bed Mobility, Bed to Chair, Car, Chief Operating Officer: PT Emergency planning/management officer, Stairs, Ambulation     Additional Impairments: OT None  SLP Swallowing, Social Cognition   Attention, Problem Solving  TR      Anticipated Outcomes Item Anticipated Outcome  Self Feeding Independent  Swallowing  Supevision A   Basic self-care  set up UB, min A LB  Toileting  min A   Bathroom Transfers supervision to toilet  Bowel/Bladder  min/mod assist  Transfers  supervision-CGA  Locomotion  supervision  Communication     Cognition  Supervision A  Pain  < 4  Safety/Judgment  Min assist   Therapy Plan: PT Intensity: Minimum of 1-2 x/day ,45 to 90 minutes PT Frequency: 5 out of 7 days PT Duration Estimated Length of Stay: 14-18 days OT Intensity: Minimum of 1-2 x/day, 45 to 90 minutes OT Frequency: 5 out of 7 days OT Duration/Estimated Length of Stay: 06/09/19 - 18-20 days SLP Intensity: Minumum of 1-2 x/day, 30 to 90 minutes SLP Frequency: 3 to 5 out of 7 days SLP Duration/Estimated Length of Stay: 10-14 days   Due to the current state of emergency, patients may not be receiving their 3-hours of Medicare-mandated therapy.   Team Interventions: Nursing Interventions Patient/Family Education, Skin Care/Wound Management, Bladder Management, Cognitive  Remediation/Compensation, Bowel Management, Dysphagia/Aspiration Precaution Training, Disease Management/Prevention, Discharge Planning, Pain Management, Medication Management  PT interventions Ambulation/gait training, Community reintegration, DME/adaptive equipment instruction, Neuromuscular re-education, Psychosocial support, Stair training, UE/LE Strength taining/ROM, Training and development officer, Discharge planning, Functional electrical stimulation, Pain management, Skin care/wound management, Therapeutic Activities, UE/LE Coordination activities, Cognitive remediation/compensation, Disease management/prevention, Functional mobility training, Patient/family education, Therapeutic Exercise  OT Interventions Balance/vestibular training, Cognitive remediation/compensation, Discharge planning, DME/adaptive equipment instruction, Functional mobility training, Psychosocial support, Patient/family education, Self Care/advanced ADL retraining, Therapeutic Activities, Therapeutic Exercise, UE/LE Strength taining/ROM, UE/LE Coordination activities  SLP Interventions Dysphagia/aspiration precaution training, Functional tasks, Patient/family education, Cueing hierarchy, Cognitive remediation/compensation, Environmental controls, Internal/external aids  TR Interventions    SW/CM Interventions Discharge Planning, Psychosocial Support, Patient/Family Education, Disease Management/Prevention   Barriers to Discharge MD  Medical stability and Nutritional means  Nursing Wound Care, Hemodialysis, New oxygen    PT      OT      SLP Nutrition means pt still with NG tube despite PO diet recommendations  SW Home environment access/layout 1 level 4 step entry to home   Team Discharge Planning: Destination: PT-Home ,OT- Home , SLP-Home Projected Follow-up: PT-Home health PT, OT-  Home health OT, SLP-Home Health SLP, 24 hour supervision/assistance Projected Equipment Needs: PT-To be determined, OT- To be determined,  SLP-None recommended by SLP Equipment Details: PT- , OT-  Patient/family involved in discharge planning: PT- Patient,  OT-Patient, SLP-Patient, Family member/caregiver  MD ELOS: 15-20 days Medical Rehab Prognosis:  Good Assessment: The patient has been admitted for CIR  therapies with the diagnosis of debility after multiple medical issues. The team will be addressing functional mobility, strength, stamina, balance, safety, adaptive techniques and equipment, self-care, bowel and bladder mgt, patient and caregiver education, NMR, cognition, activity tolerance, nutrition, swallowing. Goals have been set at supervision to min assist with basic self-care, supervision for mobility and transfers, and supervision for cognition. .   Due to the current state of emergency, patients may not be receiving their 3 hours per day of Medicare-mandated therapy.    Meredith Staggers, MD, FAAPMR      See Team Conference Notes for weekly updates to the plan of care

## 2019-05-25 NOTE — Progress Notes (Signed)
Occupational Therapy Session Note  Patient Details  Name: Jesse Murphy MRN: [id number] Date of Birth: [birth year]  Today's Date: 05/25/2019 60 minutes missed.      Short Term Goals: Week 1:  OT Short Term Goal 1 (Week 1): Pt will be able to rise to stand with min - mod A to prep for clothing management with toileting. OT Short Term Goal 2 (Week 1): Pt will be able to squat pivot to toilet or BSC with mod- max A. OT Short Term Goal 3 (Week 1): Pt will don shirt with set up. OT Short Term Goal 4 (Week 1): Pt will don pants with mod-max A.  Skilled Therapeutic Interventions/Progress Updates:    Pt greeted in bed with no c/o pain, noted emesis bag on his blanket and pt stated he vomited when getting EOB with therapy earlier. He denied participation in tx, offered to set pt up with bedlevel ADL tasks to make him feel better, such as hair washing, shaving, and face washing, also offered to assist pt to the bathroom. He continued to refuse tx, also refused repositioning in bed for pressure relief. Left him with call bell and bed alarm set. Time missed due to refusal/unwillingness to participate.   Therapy Documentation Precautions:  Precautions Precautions: Fall Precaution Booklet Issued: No Precaution Comments: slightly orthostatic Restrictions Weight Bearing Restrictions: No RUE Weight Bearing: Partial weight bearing RUE Partial Weight Bearing Percentage or Pounds: Can place ~20lbs through his UEs for mobility per MD Other Position/Activity Restrictions: no longer has sternal precautions Vital Signs: Therapy Vitals Temp: 98.2 F (36.8 C) Temp Source: Oral Pulse Rate: 78 Resp: 18 BP: (!) 129/45 Patient Position (if appropriate): Lying Oxygen Therapy SpO2: 98 % O2 Device: Room Air ADL:        Therapy/Group: Individual Therapy  Lachina Salsberry A Amahri Dengel 05/25/2019, 7:20 AM

## 2019-05-25 NOTE — Progress Notes (Signed)
[place] PHYSICAL MEDICINE & REHABILITATION PROGRESS NOTE   Subjective/Complaints:  Pt reports nausea when sat up at EOB x20 minutes today- also felt lightheaded, but BP was 126/50- Also felt nausea when cortrak was flushed with water- better now, but overall,notes zofran not real helpful for nausea. - wants to try something else.  LBM yesterday Therapy asking if off PWB on UEs and sternal precautions- since CABG was 1/28 or so, is off sternal and PWB for UEs which is was likely due to sternal precautions.    ROS:   Pt denies SOB, abd pain, CP, N/V/C/D, and vision changes   Objective:   DG Abd 1 View  Result Date: 05/24/2019 CLINICAL DATA:  Feeding tube replacement. EXAM: ABDOMEN - 1 VIEW COMPARISON:  None. FINDINGS: Feeding tube tip in the junction of the first and second portions of the duodenum. IMPRESSION: Feeding tube well positioned. Electronically Signed   By: Nelson Chimes M.D.   On: 05/24/2019 14:39   IR Fluoro Guide CV Line Right  Result Date: 05/24/2019 INDICATION: 86 year old male with renal failure referred for replacement of a nonfunctional hemodialysis catheter placed 04/23/2019 EXAM: TUNNELED CENTRAL VENOUS HEMODIALYSIS CATHETER PLACEMENT WITH ULTRASOUND AND FLUOROSCOPIC GUIDANCE MEDICATIONS: None ANESTHESIA/SEDATION: None FLUOROSCOPY TIME:  Fluoroscopy Time: 0 minutes 6 seconds (1 mGy). COMPLICATIONS: None PROCEDURE: Informed written consent was obtained from the patient after a discussion of the risks, benefits, and alternatives to treatment. Questions regarding the procedure were encouraged and answered. The left neck and chest including indwelling catheter were prepped with chlorhexidine in a sterile fashion, and a sterile drape was applied covering the operative field. Maximum barrier sterile technique with sterile gowns and gloves were used for the procedure. A timeout was performed prior to the initiation of the procedure. The dwell on both hub was successfully  aspirated, with return of venous blood. Initial image demonstrates that the previous indwelling catheter has been withdrawn slightly from the original position. 1% lidocaine was used for local anesthesia. A stiff Glidewire was advanced to the level of the IVC and catheter was bluntly dissected from the tissue tract. Modified Seldinger technique was then used to exchange the hemodialysis catheter on the Glidewire. 23 cm tip to cuff catheter was selected. Final catheter positioning was confirmed and documented with a spot radiographic image. The catheter aspirates and flushes normally. The catheter was flushed with appropriate volume heparin dwells. The catheter exit site was secured with a 0-Prolene retention suture. Dressings were applied. The patient tolerated the procedure well without immediate post procedural complication. IMPRESSION: Status post exchange of left IJ hemodialysis catheter with placement of a new 23 cm palindrome catheter. Signed, Dulcy Fanny. Dellia Nims, RPVI [facility] Electronically Signed   By: Corrie Mckusick D.O.   On: 05/24/2019 16:31   Recent Labs    05/23/19 1455  WBC 8.9  HGB 10.5*  HCT 33.4*  PLT 110*   Recent Labs    05/23/19 1455 05/25/19 0514  NA 133* 126*  K 4.2 3.8  CL 96* 90*  CO2 23 25  GLUCOSE 168* 159*  BUN 147* 96*  CREATININE 6.92* 5.54*  CALCIUM 7.8* 7.6*    Intake/Output Summary (Last 24 hours) at 05/25/2019 1714 Last data filed at 05/25/2019 1245 Gross per 24 hour  Intake 650 ml  Output --  Net 650 ml     Physical Exam: Vital Signs Blood pressure (!) 120/45, pulse 77, temperature 98.1 F (36.7 C), resp. rate 18, height 5\' 8"  (  1.727 m), weight 88.5 kg, SpO2 98 %.   General: awake, alert, sitting up slightly in bed, NAD HEENT: [name] and O2 by [place] in place, conjugate gaze  Heart: RRR- no JVD Chest: HD cath in place- little coarse with a few upper airway sounds; otherwise good air  movement B/L Abdomen: soft, NT, ND, (+)BS Extremities: 1-2+ hand/forearm edema and 2-3+ LE edema to knees B/L Skin: diffuse macular rash- improving- however c/o itching (not on hypoallergenic sheets- OT changed them).  Neuro: Ox3  Reflexes are 2+ in all 4's. Fine motor coordination is intact. No tremors. Motor function is grossly 3-4/5 prox to distal.  Musculoskeletal: Full ROM, No pain with AROM or PROM in the neck, trunk, or extremities. Posture appropriate Psych: flat affect due to nausea, minimum    Assessment/Plan: 1. Functional deficits secondary to debility which require 3+ hours per day of interdisciplinary therapy in a comprehensive inpatient rehab setting.  Physiatrist is providing close team supervision and 24 hour management of active medical problems listed below.  Physiatrist and rehab team continue to assess barriers to discharge/monitor patient progress toward functional and medical goals  Care Tool:  Bathing    Body parts bathed by patient: Right arm, Left arm, Chest, Abdomen, Right upper leg, Left upper leg   Body parts bathed by helper: Right lower leg, Left lower leg, Buttocks, Front perineal area     Bathing assist Assist Level: Moderate Assistance - Patient 50 - 74%     Upper Body Dressing/Undressing Upper body dressing   What is the patient wearing?: Hospital gown only    Upper body assist Assist Level: Minimal Assistance - Patient > 75%    Lower Body Dressing/Undressing Lower body dressing      What is the patient wearing?: Incontinence brief     Lower body assist Assist for lower body dressing: Total Assistance - Patient < 25%     Toileting Toileting    Toileting assist Assist for toileting: Total Assistance - Patient < 25%     Transfers Chair/bed transfer  Transfers assist  Chair/bed transfer activity did not occur: Safety/medical concerns(nausea/dizziness/fatigue)        Locomotion Ambulation   Ambulation assist   Ambulation  activity did not occur: Safety/medical concerns(nausea/dizziness/fatigue)          Walk 10 feet activity   Assist  Walk 10 feet activity did not occur: Safety/medical concerns        Walk 50 feet activity   Assist Walk 50 feet with 2 turns activity did not occur: Safety/medical concerns         Walk 150 feet activity   Assist Walk 150 feet activity did not occur: Safety/medical concerns         Walk 10 feet on uneven surface  activity   Assist Walk 10 feet on uneven surfaces activity did not occur: Safety/medical concerns         Wheelchair     Assist Will patient use wheelchair at discharge?: No(sternal precautions)             Wheelchair 50 feet with 2 turns activity    Assist            Wheelchair 150 feet activity     Assist          Blood pressure (!) 120/45, pulse 77, temperature 98.1 F (36.7 C), resp. rate 18, height 5\' 8"  (1.727 m), weight 88.5 kg, SpO2 98 %.  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 Impella device removed 04/10/2019. Sternal precautions -patient maynotshowerdue to L IJ for HD- unless way to cover -ELOS/Goals: 2-2.5 weeks minimum- goals Supervision to [facility]   4/9- off sternal precautions and PWB in UEs B/L 2. Antithrombotics: -DVT/anticoagulation:SCDs- due to hematuria -antiplatelet therapy: Aspirin 81 mg daily 3. Pain Management:Oxycodone as needed 4. Mood:Provide emotional support -antipsychotic agents: N/A 5. Neuropsych: This patientiscapable of making decisions on hisown behalf. 6. Skin/Wound Care:Routine skin checks 7. Fluids/Electrolytes/Nutrition:continues on TF, D2 diet but no po intake at present 8. Acute systolic congestive heart failure/cardiogenic shock. Follow-up cardiology services 9. Acute hypoxic respiratory failure with Pseudomonas pneumonia. Status post tracheostomy  03/27/2019. Decannulated 04/28/2019. 10.Acute on chronic anemia/thrombocytopenia. Follow-up CBC.Serotonin releaseassaypending. No more heparin for HD for now 11. Dysphagia/nausea. Dysphagia #2 thin liquids. Follow-up speech therapy.Nasogastric tube/cortrakfor nutritional support.   Dietary follow-up- see dietary note dated 4/6 for additional information.   -PEG not safe option as per cardiothoracic surgery team. Goal is to get nausea under better control so he can tolerate feeds better.   4/8: still eating next to nothing  4/9- Added Phenergan 12.5 mg Q6 hours prn for nausea since zofran doesn't work- will ask them to try.  12. 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 Patient has been transitioned to dialysis and being sent to OP HD after CIR, so is chronicand followed by renal services. Tunneled L IJcatheter placed 04/24/2019--IR saw today for exchange scheduled.  4/9- exchange done 13. Hematuria. Cystoscopy completed with evacuation of clot. No further bleeding episodes per notes, intermittent bleeding. Not significant today. observe. 14. Left pleural effusion. Status post Pleurx catheter 04/23/2019.PATIENT WILL KEEP PLEUREX CATHETER FOR NOW PER CVTS.Currently being drained Mondaysand Thursdays 15. Diffuse drug rashversus contact dermatitis. Appears to be resolving. Steroid taper as directed and completed- itching improved- con't supportive care. Benadryl prn. Added Sarna lotion.  4/9- trying hypoallergenic sheets 16.Rectal bleeding. 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 Follow-up GI with conservative care no further bleeding reported. 17. PAF. Patient remains normal sinus rhythm. Currently off amiodarone. No plan to resume any anticoagulation at this time until hematuria fully resolved. 18. Nausea: prn Zofran. Consider Compazine if does not help.   4/9- trying phenergan low dose prn   LOS: 3 days A FACE TO FACE  EVALUATION WAS PERFORMED  Joriel Streety 05/25/2019, 5:14 PM

## 2019-05-25 NOTE — Progress Notes (Signed)
Pt had BM, BM loose with mixed pieces of stool, also hematuria noted. Pt participated at bed level with therapy this am with lots of encouragement but has refused rest of therapy so far today. Pt also refusing to eat anything by mouth due to nausea. Tube feedings continue at 27m/ hr, held binder to see helps with nausea. Pt in bed resting with call light in reach and all needs met.

## 2019-05-25 NOTE — Progress Notes (Signed)
Orthopedic Tech Progress Note Patient Details:  Jesse Murphy 13-Apr-1928 [id number]  Patient ID: Jesse Murphy, male   DOB: 1929-02-08, 86 y.o.   MRN: [id number]   Jesse Murphy 05/25/2019, 11:53 AMRouted brace order to [employer]

## 2019-05-25 NOTE — Progress Notes (Signed)
Late note: called dialysis to find out when pt would be seen. Michela Pitcher that is busy and will get pt on next run. Notified family that pt will have dialysis today but there is no ETA. Call bell in reach and all needs met.

## 2019-05-25 NOTE — Progress Notes (Signed)
[facility] NEPHROLOGY PROGRESS NOTE  Assessment/ Plan: Pt is a 86 y.o. yo male with history of CHF, cardiogenic shock requiring Impella support, status post CABG, A. fib, hemorrhagic cystitis followed by urologist, consulted for AKI now dialysis dependent.  #Anuric acute kidney injury likely ATN due to cardiogenic shock/CABG surgery: Now dialysis dependent.  No sign of recovery so far. Concern for HIT however the test result was negative.  We will try to avoid heparin.  Needs outpatient HD arrangement for AKI. -Status post left IJ TDC catheter exchanged by IR.  Plan for HD today.  Social worker is following to arrange outpatient HD for AKI.  #Acute systolic heart failure/cardiogenic shock/status post Impella support which was removed.  Tolerating dialysis so far.  # Anemia due to critical illness/acute blood loss: Hb at goal.  # Secondary hyperparathyroidism: PTH 116. On phoslyra. Monitor phosphorus level.  # HTN/volume: BP and volume status looks acceptable.  UF during dialysis.  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  #Hyperkalemia: Resolved.  #Diffuse maculopapular rash around face and upper body: Concerned for allergic reaction to facial moisturizer, receiving steroid.  Improved.  Subjective: Seen and examined.  HD catheter was exchanged yesterday.  Plan for HD today postponed from yesterday.  Denies nausea vomiting chest pain shortness of breath.  Objective Vital signs in last 24 hours: Vitals:   05/24/19 1956 05/24/19 2001 05/25/19 0555 05/25/19 0557  BP:  (!) 126/50  (!) 129/45  Pulse:  75  78  Resp:  20  18  Temp:  97.8 F (36.6 C)  98.2 F (36.8 C)  TempSrc:  Oral  Oral  SpO2: 100% 100%  98%  Weight:   88.5 kg   Height:       Weight change: 4.7 kg  Intake/Output Summary (Last 24 hours) at 05/25/2019 1029 Last data filed at 05/25/2019 0730 Gross per 24 hour  Intake 650 ml  Output --  Net 650 ml        Labs: Basic Metabolic Panel: Recent Labs  Lab 05/22/19 0235 05/22/19 0235 05/22/19 1651 05/23/19 1455 05/25/19 0514  NA 133*   < > 131* 133* 126*  K 4.2   < > 4.9 4.2 3.8  CL 95*   < > 96* 96* 90*  CO2 22   < > 22 23 25   GLUCOSE 161*   < > 220* 168* 159*  BUN 135*   < > 149* 147* 96*  CREATININE 6.29*   < > 7.12* 6.92* 5.54*  CALCIUM 7.9*   < > 7.7* 7.8* 7.6*  PHOS 6.4*  --  6.3* 5.3*  --    < > = values in this interval not displayed.   Liver Function Tests: Recent Labs  Lab 05/22/19 0235 05/22/19 1651 05/23/19 1455  ALBUMIN 2.0* 2.1* 2.2*   No results for input(s): LIPASE, AMYLASE in the last 168 hours. No results for input(s): AMMONIA in the last 168 hours. CBC: Recent Labs  Lab 05/20/19 0257 05/20/19 0257 05/21/19 0135 05/21/19 0135 05/22/19 0235 05/22/19 1651 05/23/19 1455  WBC 7.1   < > 7.7   < > 8.7 5.2 8.9  HGB 11.0*   < > 11.0*   < > 11.0* 11.1* 10.5*  HCT 35.6*   < > 35.1*   < > 35.6* 35.7* 33.4*  MCV 92.7  --  92.4  --  93.7 92.5 92.3  PLT 87*   < > 91*   < > 99* 101* 110*   < > =  values in this interval not displayed.   Cardiac Enzymes: No results for input(s): CKTOTAL, CKMB, CKMBINDEX, TROPONINI in the last 168 hours. CBG: Recent Labs  Lab 05/24/19 0613 05/24/19 1128 05/24/19 1641 05/24/19 2107 05/25/19 0600  GLUCAP 134* 103* 104* 105* 145*    Iron Studies: No results for input(s): IRON, TIBC, TRANSFERRIN, FERRITIN in the last 72 hours. Studies/Results: DG Abd 1 View  Result Date: 05/24/2019 CLINICAL DATA:  Feeding tube replacement. EXAM: ABDOMEN - 1 VIEW COMPARISON:  None. FINDINGS: Feeding tube tip in the junction of the first and second portions of the duodenum. IMPRESSION: Feeding tube well positioned. Electronically Signed   By: Nelson Chimes M.D.   On: 05/24/2019 14:39   IR Fluoro Guide CV Line Right  Result Date: 05/24/2019 INDICATION: 86 year old male with renal failure referred for replacement of a nonfunctional  hemodialysis catheter placed 04/23/2019 EXAM: TUNNELED CENTRAL VENOUS HEMODIALYSIS CATHETER PLACEMENT WITH ULTRASOUND AND FLUOROSCOPIC GUIDANCE MEDICATIONS: None ANESTHESIA/SEDATION: None FLUOROSCOPY TIME:  Fluoroscopy Time: 0 minutes 6 seconds (1 mGy). COMPLICATIONS: None PROCEDURE: Informed written consent was obtained from the patient after a discussion of the risks, benefits, and alternatives to treatment. Questions regarding the procedure were encouraged and answered. The left neck and chest including indwelling catheter were prepped with chlorhexidine in a sterile fashion, and a sterile drape was applied covering the operative field. Maximum barrier sterile technique with sterile gowns and gloves were used for the procedure. A timeout was performed prior to the initiation of the procedure. The dwell on both hub was successfully aspirated, with return of venous blood. Initial image demonstrates that the previous indwelling catheter has been withdrawn slightly from the original position. 1% lidocaine was used for local anesthesia. A stiff Glidewire was advanced to the level of the IVC and catheter was bluntly dissected from the tissue tract. Modified Seldinger technique was then used to exchange the hemodialysis catheter on the Glidewire. 23 cm tip to cuff catheter was selected. Final catheter positioning was confirmed and documented with a spot radiographic image. The catheter aspirates and flushes normally. The catheter was flushed with appropriate volume heparin dwells. The catheter exit site was secured with a 0-Prolene retention suture. Dressings were applied. The patient tolerated the procedure well without immediate post procedural complication. IMPRESSION: Status post exchange of left IJ hemodialysis catheter with placement of a new 23 cm palindrome catheter. Signed, Dulcy Fanny. Dellia Nims, RPVI [facility] Electronically Signed   By: Corrie Mckusick  D.O.   On: 05/24/2019 16:31    Medications: Infusions: . anticoagulant sodium citrate    . anticoagulant sodium citrate    .  ceFAZolin (ANCEF) IV    . feeding supplement (NEPRO CARB STEADY) 50 mL/hr at 05/24/19 1550    Scheduled Medications: . aspirin  81 mg Oral Daily  . calcium acetate (Phos Binder)  667 mg Per Tube TID WC  . Chlorhexidine Gluconate Cloth  6 each Topical BID  . Chlorhexidine Gluconate Cloth  6 each Topical Q0600  . Chlorhexidine Gluconate Cloth  6 each Topical Q0600  . famotidine  10 mg Oral Daily  . feeding supplement (PRO-STAT SUGAR FREE 64)  60 mL Per Tube BID  . Gerhardt's butt cream   Topical BID  . hydrocortisone   Rectal BID  . hydrocortisone  25 mg Rectal BID  . hydrocortisone cream   Topical BID  . insulin aspart  0-15 Units Subcutaneous TID WC  . multivitamin  1 tablet Per Tube QHS  .  ondansetron  4 mg Oral Once  . simethicone  80 mg Oral TID    have reviewed scheduled and prn medications.  Physical Exam: General: NAD, comfortable, Heart:RRR, s1s2 nl Lungs basal rhonchi, no crackle Abdomen:soft, Non-tender, non-distended Extremities: Trace LE edema Dialysis Access: L IJ TDC site looks clean.  Lissy Deuser Reesa Chew Mykel Mohl 05/25/2019,10:29 AM  LOS: 3 days  Pager: [phone]

## 2019-05-26 ENCOUNTER — Inpatient Hospital Stay (HOSPITAL_COMMUNITY): Payer: Medicare Other | Admitting: Occupational Therapy

## 2019-05-26 ENCOUNTER — Inpatient Hospital Stay (HOSPITAL_COMMUNITY): Payer: Medicare Other | Admitting: Speech Pathology

## 2019-05-26 ENCOUNTER — Inpatient Hospital Stay (HOSPITAL_COMMUNITY): Payer: Medicare Other | Admitting: Physical Therapy

## 2019-05-26 LAB — GLUCOSE, CAPILLARY
Glucose-Capillary: 114 mg/dL — ABNORMAL HIGH (ref 70–99)
Glucose-Capillary: 165 mg/dL — ABNORMAL HIGH (ref 70–99)
Glucose-Capillary: 102 mg/dL — ABNORMAL HIGH (ref 70–99)
Glucose-Capillary: 124 mg/dL — ABNORMAL HIGH (ref 70–99)

## 2019-05-26 MED ORDER — SCOPOLAMINE 1 MG/3DAYS TD PT72
1.0000 | MEDICATED_PATCH | TRANSDERMAL | Status: DC
  Administered 2019-05-26 – 2019-06-13 (×6): 1.5 mg via TRANSDERMAL
  Filled 2019-05-26 (×6): qty 1

## 2019-05-26 NOTE — Progress Notes (Signed)
Speech Language Pathology Daily Session Note  Patient Details  Name: Jesse Murphy MRN: [id number] Date of Birth: November 29, 1928  Today's Date: 05/26/2019 SLP Individual Time: 1030-1100 SLP Individual Time Calculation (min): 30 min  Short Term Goals: Week 1: SLP Short Term Goal 1 (Week 1): Pt will consume current diet with minimal s/sx aspiration and efficient mastication and oral clearance with Min A verbal/visual cues for use of compensatory strategies SLP Short Term Goal 2 (Week 1): Pt will sustain attention to functional tasks with Min A verbal/visual cues for reidrection SLP Short Term Goal 3 (Week 1): Pt will use external aids to orient to date with Min A verbal/visual cues SLP Short Term Goal 4 (Week 1): Pt will demonstrate ability to problem solve basic familiar tasks with Min A verbal/visual cues  Skilled Therapeutic Interventions: Skilled treatment session focused on dysphagia goals. Upon arrival, patient declined all PO intake. However, patient finally agreeable to thin liquids with encouragement from his daughter. Patient consumed thin liquids via straw with supervision level verbal cues for use of a complete chin tuck with intermittent subtle coughing. However, coughing at baseline so difficult to differentiate. Throughout session, patient with expectoration of saliva with reports of "it not going down." Educated on potential need for repeat MBS to assess swallow function if globus sensation with expectoration of saliva continues. Patient declined all other "snacks" (broth, ice cream, etc) but daughter reported she will continue to encourage fluids. Patient left upright in bed with alarm on and daughter present. Continue with current plan of care.      Pain No/Denies Pain  Therapy/Group: Individual Therapy  Imogine Carvell 05/26/2019, 12:00 PM

## 2019-05-26 NOTE — Plan of Care (Signed)
  Problem: RH BOWEL ELIMINATION Goal: RH STG MANAGE BOWEL WITH ASSISTANCE Description: STG Manage Bowel with min Assistance. Outcome: Progressing

## 2019-05-26 NOTE — Progress Notes (Signed)
Occupational Therapy Session Note  Patient Details  Name: Jesse Murphy MRN: [id number] Date of Birth: Apr 01, 1928  Today's Date: 05/26/2019  approached and offered individual OT 7:32-7:36am   Skilled Therapeutic Interventions/Progress Updates: patient missed 75 minute session due to fatigue.   He stsated he returned from "downstairs at 4 this morning and I cannot keep my eyes open and I am too tired.?      Therapy Documentation Precautions:  Precautions Precautions: Fall Precaution Booklet Issued: Yes (comment) Precaution Comments: monitor for orthostatic hypotension Restrictions Weight Bearing Restrictions: No RUE Weight Bearing: Weight bearing as tolerated(no WBing restriction) RUE Partial Weight Bearing Percentage or Pounds: No restrictions Other Position/Activity Restrictions: no longer has sternal precautions or UE PWB precautions - confirmed with Dr. Dagoberto Ligas on 4/9 General:patient missed 75 minute session due to fatigue.   He stsated he returned from "downstairs at 4 this morning and I cannot keep my eyes open and I am too tired.?    paindenied  Therapy/Group: Individual Therapy  Alfredia Ferguson [facility] 05/26/2019, 8:31 AM

## 2019-05-26 NOTE — Plan of Care (Signed)
  Problem: RH BOWEL ELIMINATION Goal: RH STG MANAGE BOWEL WITH ASSISTANCE Description: STG Manage Bowel with min Assistance. Outcome: Progressing Goal: RH STG MANAGE BOWEL W/MEDICATION W/ASSISTANCE Description: STG Manage Bowel with Medication with min Assistance. Outcome: Progressing

## 2019-05-26 NOTE — Progress Notes (Signed)
[place] PHYSICAL MEDICINE & REHABILITATION PROGRESS NOTE   Subjective/Complaints: Nausea much better controlled. Continues to have itching. Nausea still does inhibit his ability to eat. Also has dizziness and lightheadedness during therapy.  Daughter is at bedside this morning DBP is very low at 41  ROS:  Pt denies SOB, abd pain, CP, N/V/C/D, and vision changes   Objective:   No results found. No results for input(s): WBC, HGB, HCT, PLT in the last 72 hours. Recent Labs    05/25/19 0514  NA 126*  K 3.8  CL 90*  CO2 25  GLUCOSE 159*  BUN 96*  CREATININE 5.54*  CALCIUM 7.6*    Intake/Output Summary (Last 24 hours) at 05/26/2019 1759 Last data filed at 05/26/2019 1300 Gross per 24 hour  Intake 120 ml  Output --  Net 120 ml     Physical Exam: Vital Signs Blood pressure (!) 128/41, pulse 81, temperature 98.3 F (36.8 C), resp. rate 19, height 5\' 8"  (1.727 m), weight 89.4 kg, SpO2 99 %. General: awake, alert, sitting up slightly in bed, NAD, daughter is at bedside.  HEENT: [name] and O2 by [place] in place, conjugate gaze  Heart: RRR- no JVD Chest: HD cath in place- little coarse with a few upper airway sounds; otherwise good air movement B/L Abdomen: soft, NT, ND, (+)BS Extremities: 1-2+ hand/forearm edema and 2-3+ LE edema to knees B/L Skin: diffuse macular rash- improving- however c/o itching (not on hypoallergenic sheets- OT changed them).  Neuro: Ox3  Reflexes are 2+ in all 4's. Fine motor coordination is intact. No tremors. Motor function is grossly 3-4/5 prox to distal.  Musculoskeletal: Full ROM, No pain with AROM or PROM in the neck, trunk, or extremities. Posture appropriate Psych: flat affect due to nausea, minimum    Assessment/Plan: 1. Functional deficits secondary to debility which require 3+ hours per day of interdisciplinary therapy in a comprehensive inpatient rehab setting.  Physiatrist is providing close team supervision and 24 hour management of  active medical problems listed below.  Physiatrist and rehab team continue to assess barriers to discharge/monitor patient progress toward functional and medical goals  Care Tool:  Bathing    Body parts bathed by patient: Right arm, Left arm, Chest, Abdomen, Right upper leg, Left upper leg   Body parts bathed by helper: Right lower leg, Left lower leg, Buttocks, Front perineal area     Bathing assist Assist Level: Moderate Assistance - Patient 50 - 74%     Upper Body Dressing/Undressing Upper body dressing   What is the patient wearing?: Hospital gown only    Upper body assist Assist Level: Minimal Assistance - Patient > 75%    Lower Body Dressing/Undressing Lower body dressing      What is the patient wearing?: Incontinence brief     Lower body assist Assist for lower body dressing: Total Assistance - Patient < 25%     Toileting Toileting    Toileting assist Assist for toileting: Total Assistance - Patient < 25%     Transfers Chair/bed transfer  Transfers assist  Chair/bed transfer activity did not occur: Safety/medical concerns(nausea/dizziness/fatigue)        Locomotion Ambulation   Ambulation assist   Ambulation activity did not occur: Safety/medical concerns(nausea/dizziness/fatigue)          Walk 10 feet activity   Assist  Walk 10 feet activity did not occur: Safety/medical concerns        Walk 50 feet activity   Assist Walk 50 feet  with 2 turns activity did not occur: Safety/medical concerns         Walk 150 feet activity   Assist Walk 150 feet activity did not occur: Safety/medical concerns         Walk 10 feet on uneven surface  activity   Assist Walk 10 feet on uneven surfaces activity did not occur: Safety/medical concerns         Wheelchair     Assist Will patient use wheelchair at discharge?: No(sternal precautions)             Wheelchair 50 feet with 2 turns activity    Assist             Wheelchair 150 feet activity     Assist          Blood pressure (!) 128/41, pulse 81, temperature 98.3 F (36.8 C), resp. rate 19, height 5\' 8"  (1.727 m), weight 89.4 kg, SpO2 99 %.  Medical Problem List and Plan: 1.Debilitysecondary to CAD status post CABG 03/15/2019. Impella device removed 04/10/2019. Sternal precautions -patient maynotshowerdue to L IJ for HD- unless way to cover -ELOS/Goals: 2-2.5 weeks minimum- goals Supervision to CGA   Off sternal precautions and PWB in UEs B/L  -Continue CIR PT, OT, SLP 2. Antithrombotics: -DVT/anticoagulation:SCDs- due to hematuria -antiplatelet therapy: Aspirin 81 mg daily 3. Pain Management:Oxycodone as needed  4/10: well controlled, not requiring oxycodone 4. Mood:Provide emotional support -antipsychotic agents: N/A 5. Neuropsych: This patientiscapable of making decisions on hisown behalf. 6. Skin/Wound Care:Routine skin checks 7. Fluids/Electrolytes/Nutrition:continues on TF, D2 diet but no po intake at present 8. Acute systolic congestive heart failure/cardiogenic shock. Follow-up cardiology services 9. Acute hypoxic respiratory failure with Pseudomonas pneumonia. Status post tracheostomy 03/27/2019. Decannulated 04/28/2019. 10.Acute on chronic anemia/thrombocytopenia. Follow-up CBC.Serotonin releaseassaypending. No more heparin for HD for now 11. Dysphagia/nausea. Dysphagia #2 thin liquids. Follow-up speech therapy.Nasogastric tube/cortrakfor nutritional support.   Dietary follow-up- see dietary note dated 4/6 for additional information.   -PEG not safe option as per cardiothoracic surgery team. Goal is to get nausea under better control so he can tolerate feeds better.   4/8: still eating next to nothing  4/9- Added Phenergan 12.5 mg Q6 hours prn for nausea since zofran doesn't work- will ask them to try.   4/10: Nausea improved, but still limiting  tolerance of food. He has been drinking ginger ale, which also helps the nausea. Added scopolamine patch for nausea and sizziness.  12. 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 Patient has been transitioned to dialysis and being sent to OP HD after CIR, so is chronicand followed by renal services. Tunneled L IJcatheter placed 04/24/2019--IR saw today for exchange scheduled.  4/9- exchange done 13. Hematuria. Cystoscopy completed with evacuation of clot. No further bleeding episodes per notes, intermittent bleeding. Not significant today. observe. 14. Left pleural effusion. Status post Pleurx catheter 04/23/2019.PATIENT WILL KEEP PLEUREX CATHETER FOR NOW PER CVTS.Currently being drained Mondaysand Thursdays  4/10: breathing comfortably 15. Diffuse drug rashversus contact dermatitis. Appears to be resolving. Steroid taper as directed and completed- itching improved- con't supportive care. Benadryl prn. Added Sarna lotion.  4/9- trying hypoallergenic sheets 16.Rectal bleeding. 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 Follow-up GI with conservative care no further bleeding reported. 17. PAF. Patient remains normal sinus rhythm. Currently off amiodarone. No plan to resume any anticoagulation at this time until hematuria fully resolved.   LOS: 4 days A FACE TO [place]  P Floyd Lusignan 05/26/2019, 5:59 PM

## 2019-05-26 NOTE — Progress Notes (Signed)
Jesse [facility] NEPHROLOGY PROGRESS NOTE  Assessment/ Plan: Pt is a 86 y.o. yo male with history of CHF, cardiogenic shock requiring Impella support, status post CABG, A. fib, hemorrhagic cystitis followed by urologist, consulted for AKI now dialysis dependent.  #Anuric acute kidney injury likely ATN due to cardiogenic shock/CABG surgery: Now dialysis dependent.  No sign of recovery so far. Concern for HIT however the test result was negative.  We will try to avoid heparin.  Needs outpatient HD arrangement for AKI. -Status post left IJ TDC catheter exchanged by IR. -Completed dialysis around 2:30 AM last night.  Plan for next HD on 4/12.  Social worker is following to arrange outpatient HD for AKI.  #Acute systolic heart failure/cardiogenic shock/status post Impella support which was removed.  Tolerating dialysis so far.  # Anemia due to critical illness/acute blood loss: Hb at goal.  # Secondary hyperparathyroidism: PTH 116. On phoslyra. Monitor phosphorus level.  # HTN/volume: BP and volume status looks acceptable.  UF during dialysis.  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  #Hyperkalemia: Resolved.  #Hyponatremia, hypervolemic: Now managed with dialysis.  #Diffuse maculopapular rash around face and upper body: Concerned for allergic reaction to facial moisturizer, receiving steroid.  Improved.  Subjective: Seen and examined.  Completed dialysis early morning.  Denies nausea vomiting chest pain shortness of breath.  Objective Vital signs in last 24 hours: Vitals:   05/26/19 0200 05/26/19 0230 05/26/19 0414 05/26/19 0636  BP: (!) 108/55 (!) 117/52 (!) 129/48   Pulse: 81 83 81   Resp:   19   Temp:   97.7 F (36.5 C)   TempSrc:      SpO2:   98%   Weight:    89.4 kg  Height:       Weight change: 2.3 kg  Intake/Output Summary (Last 24 hours) at 05/26/2019 1046 Last data filed at 05/26/2019 0805 Gross per 24 hour  Intake  100 ml  Output --  Net 100 ml       Labs: Basic Metabolic Panel: Recent Labs  Lab 05/22/19 0235 05/22/19 0235 05/22/19 1651 05/23/19 1455 05/25/19 0514  NA 133*   < > 131* 133* 126*  K 4.2   < > 4.9 4.2 3.8  CL 95*   < > 96* 96* 90*  CO2 22   < > 22 23 25   GLUCOSE 161*   < > 220* 168* 159*  BUN 135*   < > 149* 147* 96*  CREATININE 6.29*   < > 7.12* 6.92* 5.54*  CALCIUM 7.9*   < > 7.7* 7.8* 7.6*  PHOS 6.4*  --  6.3* 5.3*  --    < > = values in this interval not displayed.   Liver Function Tests: Recent Labs  Lab 05/22/19 0235 05/22/19 1651 05/23/19 1455  ALBUMIN 2.0* 2.1* 2.2*   No results for input(s): LIPASE, AMYLASE in the last 168 hours. No results for input(s): AMMONIA in the last 168 hours. CBC: Recent Labs  Lab 05/20/19 0257 05/20/19 0257 05/21/19 0135 05/21/19 0135 05/22/19 0235 05/22/19 1651 05/23/19 1455  WBC 7.1   < > 7.7   < > 8.7 5.2 8.9  HGB 11.0*   < > 11.0*   < > 11.0* 11.1* 10.5*  HCT 35.6*   < > 35.1*   < > 35.6* 35.7* 33.4*  MCV 92.7  --  92.4  --  93.7 92.5 92.3  PLT 87*   < > 91*   < >  99* 101* 110*   < > = values in this interval not displayed.   Cardiac Enzymes: No results for input(s): CKTOTAL, CKMB, CKMBINDEX, TROPONINI in the last 168 hours. CBG: Recent Labs  Lab 05/25/19 0600 05/25/19 1218 05/25/19 1654 05/25/19 2105 05/26/19 0607  GLUCAP 145* 151* 115* 142* 114*    Iron Studies: No results for input(s): IRON, TIBC, TRANSFERRIN, FERRITIN in the last 72 hours. Studies/Results: DG Abd 1 View  Result Date: 05/24/2019 CLINICAL DATA:  Feeding tube replacement. EXAM: ABDOMEN - 1 VIEW COMPARISON:  None. FINDINGS: Feeding tube tip in the junction of the first and second portions of the duodenum. IMPRESSION: Feeding tube well positioned. Electronically Signed   By: Nelson Chimes M.D.   On: 05/24/2019 14:39   IR Fluoro Guide CV Line Right  Result Date: 05/24/2019 INDICATION: 86 year old male with renal failure referred for  replacement of a nonfunctional hemodialysis catheter placed 04/23/2019 EXAM: TUNNELED CENTRAL VENOUS HEMODIALYSIS CATHETER PLACEMENT WITH ULTRASOUND AND FLUOROSCOPIC GUIDANCE MEDICATIONS: None ANESTHESIA/SEDATION: None FLUOROSCOPY TIME:  Fluoroscopy Time: 0 minutes 6 seconds (1 mGy). COMPLICATIONS: None PROCEDURE: Informed written consent was obtained from the patient after a discussion of the risks, benefits, and alternatives to treatment. Questions regarding the procedure were encouraged and answered. The left neck and chest including indwelling catheter were prepped with chlorhexidine in a sterile fashion, and a sterile drape was applied covering the operative field. Maximum barrier sterile technique with sterile gowns and gloves were used for the procedure. A timeout was performed prior to the initiation of the procedure. The dwell on both hub was successfully aspirated, with return of venous blood. Initial image demonstrates that the previous indwelling catheter has been withdrawn slightly from the original position. 1% lidocaine was used for local anesthesia. A stiff Glidewire was advanced to the level of the IVC and catheter was bluntly dissected from the tissue tract. Modified Seldinger technique was then used to exchange the hemodialysis catheter on the Glidewire. 23 cm tip to cuff catheter was selected. Final catheter positioning was confirmed and documented with a spot radiographic image. The catheter aspirates and flushes normally. The catheter was flushed with appropriate volume heparin dwells. The catheter exit site was secured with a 0-Prolene retention suture. Dressings were applied. The patient tolerated the procedure well without immediate post procedural complication. IMPRESSION: Status post exchange of left IJ hemodialysis catheter with placement of a new 23 cm palindrome catheter. Signed, Dulcy Fanny. Dellia Nims, RPVI [facility] Radiology  Electronically Signed   By: Corrie Mckusick D.O.   On: 05/24/2019 16:31    Medications: Infusions: . anticoagulant sodium citrate    . anticoagulant sodium citrate    . feeding supplement (NEPRO CARB STEADY) 1,000 mL (05/26/19 0912)    Scheduled Medications: . aspirin  81 mg Oral Daily  . calcium acetate (Phos Binder)  667 mg Per Tube TID WC  . Chlorhexidine Gluconate Cloth  6 each Topical BID  . famotidine  10 mg Oral Daily  . feeding supplement (PRO-STAT SUGAR FREE 64)  60 mL Per Tube BID  . Gerhardt's butt cream   Topical BID  . hydrocortisone   Rectal BID  . hydrocortisone  25 mg Rectal BID  . hydrocortisone cream   Topical BID  . insulin aspart  0-15 Units Subcutaneous TID WC  . multivitamin  1 tablet Per Tube QHS  . ondansetron  4 mg Oral Once  . simethicone  80 mg Oral TID    have reviewed  scheduled and prn medications.  Physical Exam: General: NAD, comfortable, Heart:RRR, s1s2 nl Lungs clear anteriorly, no increased work of breathing. Abdomen:soft, Non-tender, non-distended Extremities: Trace LE edema Dialysis Access: L IJ TDC site looks clean.  Madisyn Mawhinney Prasad Marybell Robards 05/26/2019,10:46 AM  LOS: 4 days  Pager: [phone]

## 2019-05-26 NOTE — Progress Notes (Signed)
Physical Therapy Session Note  Patient Details  Name: Jesse Murphy MRN: [id number] Date of Birth: 1928-07-02  Today's Date: 05/26/2019 PT Individual Time: 0803-0823 PT Individual Time Calculation (min): 20 min   Short Term Goals: Week 1:  PT Short Term Goal 1 (Week 1): Pt will perform bed<>chair transfer min assist with LRAD PT Short Term Goal 2 (Week 1): Pt will ambulate x 10 ft with LRAD and min assist PT Short Term Goal 3 (Week 1): Pt will tolerate sitting up in w/c or recliner OOB >1 hr PT Short Term Goal 4 (Week 1): Pt will perform sit<>stand with LRAD and min assist, maintaining sternal precuations  Skilled Therapeutic Interventions/Progress Updates:  Pt was seen bedside in the am. Pt very sleeping difficult to maintain arousal. Pt unwilling to participate with max education and encouragement due to level of fatigue. Pt moved up in bed with + 2 assist. Pt rolled R/L with side rails and mod A with verbal cues to assist with hygiene. Pt was dependent with hygiene and brief change. Pt left positioned in L side lying with all needs within reach. Discussed with pt's nurse. Pt returned from dialysis very early this am. Pt may benefit from therapy scheduled later in the day.   Therapy Documentation Precautions:  Precautions Precautions: Fall Precaution Booklet Issued: Yes (comment) Precaution Comments: monitor for orthostatic hypotension Restrictions Weight Bearing Restrictions: No RUE Weight Bearing: Weight bearing as tolerated(no WBing restriction) RUE Partial Weight Bearing Percentage or Pounds: No restrictions Other Position/Activity Restrictions: no longer has sternal precautions or UE PWB precautions - confirmed with Dr. Dagoberto Ligas on 4/9 General: PT Amount of Missed Time (min): 25 Minutes PT Missed Treatment Reason: Patient fatigue(returned from dialysis in early am) Pain: No c/o pain.   Therapy/Group: Individual Therapy  Dub Amis 05/26/2019, 8:28 AM

## 2019-05-27 ENCOUNTER — Inpatient Hospital Stay (HOSPITAL_COMMUNITY): Payer: Medicare Other

## 2019-05-27 ENCOUNTER — Inpatient Hospital Stay (HOSPITAL_COMMUNITY): Payer: Medicare Other | Admitting: Occupational Therapy

## 2019-05-27 LAB — GLUCOSE, CAPILLARY
Glucose-Capillary: 146 mg/dL — ABNORMAL HIGH (ref 70–99)
Glucose-Capillary: 150 mg/dL — ABNORMAL HIGH (ref 70–99)
Glucose-Capillary: 89 mg/dL (ref 70–99)
Glucose-Capillary: 146 mg/dL — ABNORMAL HIGH (ref 70–99)

## 2019-05-27 MED ORDER — CHLORHEXIDINE GLUCONATE CLOTH 2 % EX PADS
6.0000 | MEDICATED_PAD | Freq: Every day | CUTANEOUS | Status: DC
  Administered 2019-05-27 – 2019-06-12 (×15): 6 via TOPICAL

## 2019-05-27 NOTE — Progress Notes (Addendum)
[facility] NEPHROLOGY PROGRESS NOTE  Assessment/ Plan: Pt is a 86 y.o. yo male with history of CHF, cardiogenic shock requiring Impella support, status post CABG, A. fib, hemorrhagic cystitis followed by urologist, consulted for AKI now dialysis dependent.  #Anuric acute kidney injury likely ATN due to cardiogenic shock/CABG surgery: Now dialysis dependent.  No sign of recovery so far. Concern for HIT however the test result was negative.  We will try to avoid heparin.  Needs outpatient HD arrangement for AKI. -Status post left IJ TDC catheter exchanged by IR. -Plan for next HD on 4/12.  Social worker is following to arrange outpatient HD for AKI.  UF as tolerated.  #Acute systolic heart failure/cardiogenic shock/status post Impella support which was removed.  Tolerating dialysis so far.  # Anemia due to critical illness/acute blood loss: Hb at goal.  # Secondary hyperparathyroidism: PTH 116. On phoslyra. Monitor phosphorus level.  # HTN/volume: Volume up on exam.  We will attempt UF during HD. Midodrine and albumin before HD if needed.   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  #Hyperkalemia: Resolved.  #Hyponatremia, hypervolemic: Now managed with dialysis.  #Diffuse maculopapular rash around face and upper body: Concerned for allergic reaction to facial moisturizer, receiving steroid.  Improved.  Subjective: Seen and examined.  No new event.  Denies nausea vomiting chest pain shortness of breath. Objective Vital signs in last 24 hours: Vitals:   05/26/19 0636 05/26/19 1434 05/26/19 2031 05/27/19 0548  BP:  (!) 128/41 (!) 125/44 (!) 129/46  Pulse:  81 79 83  Resp:   18 18  Temp:  98.3 F (36.8 C) 98.7 F (37.1 C) 98 F (36.7 C)  TempSrc:      SpO2:  99% 98% 98%  Weight: 89.4 kg   90.3 kg  Height:       Weight change: -0.5 kg  Intake/Output Summary (Last 24 hours) at 05/27/2019 1128 Last data filed at 05/27/2019  0830 Gross per 24 hour  Intake 20 ml  Output -  Net 20 ml       Labs: Basic Metabolic Panel: Recent Labs  Lab 05/22/19 0235 05/22/19 0235 05/22/19 1651 05/23/19 1455 05/25/19 0514  NA 133*   < > 131* 133* 126*  K 4.2   < > 4.9 4.2 3.8  CL 95*   < > 96* 96* 90*  CO2 22   < > 22 23 25   GLUCOSE 161*   < > 220* 168* 159*  BUN 135*   < > 149* 147* 96*  CREATININE 6.29*   < > 7.12* 6.92* 5.54*  CALCIUM 7.9*   < > 7.7* 7.8* 7.6*  PHOS 6.4*  --  6.3* 5.3*  --    < > = values in this interval not displayed.   Liver Function Tests: Recent Labs  Lab 05/22/19 0235 05/22/19 1651 05/23/19 1455  ALBUMIN 2.0* 2.1* 2.2*   No results for input(s): LIPASE, AMYLASE in the last 168 hours. No results for input(s): AMMONIA in the last 168 hours. CBC: Recent Labs  Lab 05/21/19 0135 05/21/19 0135 05/22/19 0235 05/22/19 1651 05/23/19 1455  WBC 7.7   < > 8.7 5.2 8.9  HGB 11.0*   < > 11.0* 11.1* 10.5*  HCT 35.1*   < > 35.6* 35.7* 33.4*  MCV 92.4  --  93.7 92.5 92.3  PLT 91*   < > 99* 101* 110*   < > = values in this interval not displayed.  Cardiac Enzymes: No results for input(s): CKTOTAL, CKMB, CKMBINDEX, TROPONINI in the last 168 hours. CBG: Recent Labs  Lab 05/26/19 0607 05/26/19 1134 05/26/19 1639 05/26/19 2127 05/27/19 0559  GLUCAP 114* 165* 102* 124* 146*    Iron Studies: No results for input(s): IRON, TIBC, TRANSFERRIN, FERRITIN in the last 72 hours. Studies/Results: No results found.  Medications: Infusions: . anticoagulant sodium citrate    . anticoagulant sodium citrate    . feeding supplement (NEPRO CARB STEADY) 1,000 mL (05/26/19 0912)    Scheduled Medications: . aspirin  81 mg Oral Daily  . calcium acetate (Phos Binder)  667 mg Per Tube TID WC  . Chlorhexidine Gluconate Cloth  6 each Topical BID  . famotidine  10 mg Oral Daily  . feeding supplement (PRO-STAT SUGAR FREE 64)  60 mL Per Tube BID  . Gerhardt's butt cream   Topical BID  .  hydrocortisone   Rectal BID  . hydrocortisone  25 mg Rectal BID  . hydrocortisone cream   Topical BID  . insulin aspart  0-15 Units Subcutaneous TID WC  . multivitamin  1 tablet Per Tube QHS  . ondansetron  4 mg Oral Once  . scopolamine  1 patch Transdermal Q72H  . simethicone  80 mg Oral TID    have reviewed scheduled and prn medications.  Physical Exam: General: NAD, comfortable, has NG feeding tube Heart:RRR, s1s2 nl Lungs bibasal decreased breath sound, no increased work of breathing Abdomen:soft, Non-tender, non-distended Extremities: Lower extremities edema present. Dialysis Access: L IJ TDC site looks clean.  Christne Platts Tanna Furry 05/27/2019,11:28 AM  LOS: 5 days  Pager: [phone]

## 2019-05-27 NOTE — Progress Notes (Signed)
Physical Therapy Session Note  Patient Details  Name: Jesse Murphy MRN: [id number] Date of Birth: Feb 20, 1928   Today's Date: 05/27/2019 PT Individual Time: 1694-5038 PT Individual Time Calculation (min): 45 min   and Today's Date: 05/27/2019 PT Missed Time: 30 Minutes Missed Time Reason: Patient fatigue;Patient ill (Comment)  Short Term Goals: Week 1:  PT Short Term Goal 1 (Week 1): Pt will perform bed<>chair transfer min assist with LRAD PT Short Term Goal 2 (Week 1): Pt will ambulate x 10 ft with LRAD and min assist PT Short Term Goal 3 (Week 1): Pt will tolerate sitting up in w/c or recliner OOB >1 hr PT Short Term Goal 4 (Week 1): Pt will perform sit<>stand with LRAD and min assist, maintaining sternal precuations  Skilled Therapeutic Interventions/Progress Updates:    Pt supine in bed upon PT arrival, agreeable to therapy tx and denies pain. Pt reports "Oh, I thought therapy was tomorrow." With encouragement, pt willing to try getting OOB. Pt transferred to sitting EOB with mod assist to help elevate trunk, pt does repot dizziness upon sitting. Pt seated EOB performed 2 x 10 LAQ for strengthening and activity tolerance. Pt agreeable to try transferring to w/c this session. Squat pivot to the w/c with mod assist, cues for techniques. Pt continues to report dizziness, vitals checked while in w/c - BP 119/50 with HR 77 bpm, SpO2 98%. Pt transported to the gym. Pt propelled w/c this session x 50 ft with supervision using B UE s working on strength and endurance. Pt transported to dayroom, pt with episode of nausea/spitting, therapist provided pt with emesis bag and washcloth. Pt used kinetron this session while seated in w/c working on LE strength and activity tolerance - performed 3 x 1 min bouts on resistance level 40 cm/sec. Pt with continued nausea/spitting and requesting to go back to the room. Pt transported back to room, ambulated x 5 ft from w/c>HOB with RW and mod assist, cues for  techniques and upright posture. Pt assisted to supine with mod assist for LE management. Pt missed 30 minutes of skilled therapy tx secondary to nausea/fatigue. Left with bed alarm set and needs in reach.    Therapy Documentation Precautions:  Precautions Precautions: Fall Precaution Booklet Issued: Yes (comment) Precaution Comments: monitor for orthostatic hypotension Restrictions Weight Bearing Restrictions: No RUE Weight Bearing: Weight bearing as tolerated RUE Partial Weight Bearing Percentage or Pounds: No restrictions Other Position/Activity Restrictions: no longer has sternal precautions or UE PWB precautions - confirmed with Dr. Dagoberto Ligas on 4/9    Therapy/Group: Individual Therapy  Netta Corrigan, PT, DPT, CSRS 05/27/2019, 12:59 PM

## 2019-05-27 NOTE — Plan of Care (Signed)
  Problem: Consults Goal: RH GENERAL PATIENT EDUCATION Description: See Patient Education module for education specifics. Outcome: Progressing Goal: Diabetes Guidelines if Diabetic/Glucose > 140 Description: If diabetic or lab glucose is > 140 mg/dl - Initiate Diabetes/Hyperglycemia Guidelines & Document Interventions  Outcome: Progressing   Problem: RH BOWEL ELIMINATION Goal: RH STG MANAGE BOWEL WITH ASSISTANCE Description: STG Manage Bowel with min Assistance. Outcome: Progressing Goal: RH STG MANAGE BOWEL W/MEDICATION W/ASSISTANCE Description: STG Manage Bowel with Medication with min Assistance. Outcome: Progressing

## 2019-05-27 NOTE — Progress Notes (Signed)
[facility] PHYSICAL MEDICINE & REHABILITATION PROGRESS NOTE   Subjective/Complaints: Nausea much better controlled. Has increased swelling in arms and legs. Discussed potentially moving tomorrow's dialysis to today but patient prefers to have dialysis tomorrow, does not feel uncomfortably. Tried to eat some magic cup but was unable to tolerate much.   ROS:  Pt denies SOB, abd pain, CP, N/V/C/D, and vision changes   Objective:   No results found. No results for input(s): WBC, HGB, HCT, PLT in the last 72 hours. Recent Labs    05/25/19 0514  NA 126*  K 3.8  CL 90*  CO2 25  GLUCOSE 159*  BUN 96*  CREATININE 5.54*  CALCIUM 7.6*    Intake/Output Summary (Last 24 hours) at 05/27/2019 1611 Last data filed at 05/27/2019 0830 Gross per 24 hour  Intake 0 ml  Output --  Net 0 ml     Physical Exam: Vital Signs Blood pressure (!) 138/49, pulse 78, temperature 97.8 F (36.6 C), resp. rate 19, height 5\' 8"  (1.727 m), weight 90.3 kg, SpO2 98 %. General: awake, alert, sitting up slightly in bed, NAD, daughter is at bedside.  HEENT: [name] and O2 by [place] in place, conjugate gaze  Heart: RRR- no JVD Chest: HD cath in place- little coarse with a few upper airway sounds; otherwise good air movement B/L Abdomen: soft, NT, ND, (+)BS Extremities: 1-2+ hand/forearm edema and 2-3+ LE edema to knees B/L, edema seemed slightly increased throughout, nontender Skin: diffuse macular rash- improving- however c/o itching (not on hypoallergenic sheets- OT changed them).  Neuro: Ox3  Reflexes are 2+ in all 4's. Fine motor coordination is intact. No tremors. Motor function is grossly 4/5 prox to distal.  Musculoskeletal: Full ROM, No pain with AROM or PROM in the neck, trunk, or extremities. Posture appropriate Psych: flat affect due to nausea, minimum    Assessment/Plan: 1. Functional deficits secondary to debility which require 3+ hours per day of interdisciplinary therapy in a comprehensive  inpatient rehab setting.  Physiatrist is providing close team supervision and 24 hour management of active medical problems listed below.  Physiatrist and rehab team continue to assess barriers to discharge/monitor patient progress toward functional and medical goals  Care Tool:  Bathing    Body parts bathed by patient: Right arm, Left arm, Chest, Abdomen, Right upper leg, Left upper leg   Body parts bathed by helper: Right lower leg, Left lower leg, Buttocks, Front perineal area     Bathing assist Assist Level: Moderate Assistance - Patient 50 - 74%     Upper Body Dressing/Undressing Upper body dressing   What is the patient wearing?: Hospital gown only    Upper body assist Assist Level: Minimal Assistance - Patient > 75%    Lower Body Dressing/Undressing Lower body dressing      What is the patient wearing?: Incontinence brief     Lower body assist Assist for lower body dressing: Total Assistance - Patient < 25%     Toileting Toileting    Toileting assist Assist for toileting: Total Assistance - Patient < 25%     Transfers Chair/bed transfer  Transfers assist  Chair/bed transfer activity did not occur: Safety/medical concerns(nausea/dizziness/fatigue)        Locomotion Ambulation   Ambulation assist   Ambulation activity did not occur: Safety/medical concerns(nausea/dizziness/fatigue)          Walk 10 feet activity   Assist  Walk 10 feet activity did not occur: Safety/medical concerns  Walk 50 feet activity   Assist Walk 50 feet with 2 turns activity did not occur: Safety/medical concerns         Walk 150 feet activity   Assist Walk 150 feet activity did not occur: Safety/medical concerns         Walk 10 feet on uneven surface  activity   Assist Walk 10 feet on uneven surfaces activity did not occur: Safety/medical concerns         Wheelchair     Assist Will patient use wheelchair at discharge?: No(sternal  precautions)             Wheelchair 50 feet with 2 turns activity    Assist            Wheelchair 150 feet activity     Assist          Blood pressure (!) 138/49, pulse 78, temperature 97.8 F (36.6 C), resp. rate 19, height 5\' 8"  (1.727 m), weight 90.3 kg, SpO2 98 %.  Medical Problem List and Plan: 1.Debilitysecondary to CAD status post CABG 03/15/2019. Impella device removed 04/10/2019. Sternal precautions -patient maynotshowerdue to L IJ for HD- unless way to cover -ELOS/Goals: 2-2.5 weeks minimum- goals Supervision to CGA   Off sternal precautions and PWB in UEs B/L  -Continue CIR PT, OT, SLP 2. Antithrombotics: -DVT/anticoagulation:SCDs- due to hematuria -antiplatelet therapy: Aspirin 81 mg daily 3. Pain Management:Oxycodone as needed  4/11: well controlled, not requiring oxycodone 4. Mood:Provide emotional support -antipsychotic agents: N/A 5. Neuropsych: This patientiscapable of making decisions on hisown behalf. 6. Skin/Wound Care:Routine skin checks 7. Fluids/Electrolytes/Nutrition:continues on TF, D2 diet but no po intake at present 8. Acute systolic congestive heart failure/cardiogenic shock. Follow-up cardiology services 9. Acute hypoxic respiratory failure with Pseudomonas pneumonia. Status post tracheostomy 03/27/2019. Decannulated 04/28/2019. 10.Acute on chronic anemia/thrombocytopenia. Follow-up CBC.Serotonin releaseassaypending. No more heparin for HD for now 11. Dysphagia/nausea. Dysphagia #2 thin liquids. Follow-up speech therapy.Nasogastric tube/cortrakfor nutritional support.   Dietary follow-up- see dietary note dated 4/6 for additional information.   -PEG not safe option as per cardiothoracic surgery team. Goal is to get nausea under better control so he can tolerate feeds better.   4/8: still eating next to nothing  4/9- Added Phenergan 12.5 mg Q6 hours prn  for nausea since zofran doesn't work- will ask them to try.   4/10: Nausea improved, but still limiting tolerance of food. He has been drinking ginger ale, which also helps the nausea. Added scopolamine patch for nausea and sizziness.   4/11: Nausea continues to improve but still unable to tolerate food.  12. 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 Patient has been transitioned to dialysis and being sent to OP HD after CIR, so is chronicand followed by renal services. Tunneled L IJcatheter placed 04/24/2019--IR saw today for exchange scheduled.  4/9- exchange done 13. Hematuria. Cystoscopy completed with evacuation of clot. No further bleeding episodes per notes, intermittent bleeding. Not significant today. observe. 14. Left pleural effusion. Status post Pleurx catheter 04/23/2019.PATIENT WILL KEEP PLEUREX CATHETER FOR NOW PER CVTS.Currently being drained Mondaysand Thursdays  4/10: breathing comfortably 15. Diffuse drug rashversus contact dermatitis. Appears to be resolving. Steroid taper as directed and completed- itching improved- con't supportive care. Benadryl prn. Added Sarna lotion, hypoallergenic sheets. Improved with above.  16.Rectal bleeding. 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 Follow-up GI with conservative care no further bleeding reported. 17. PAF. Patient remains normal sinus rhythm. Currently off amiodarone. No plan to  resume any anticoagulation at this time until hematuria fully resolved.   LOS: 5 days A FACE TO FACE EVALUATION WAS PERFORMED  Clide Deutscher Ndidi Nesby 05/27/2019, 4:11 PM

## 2019-05-27 NOTE — Progress Notes (Signed)
Occupational Therapy Session Note  Patient Details  Name: Jesse Murphy MRN: [id number] Date of Birth: March 08, 1928  Today's Date: 05/27/2019 OT Individual Time: 2952-8413 OT Individual Time Calculation (min): 25 min    Short Term Goals: Week 1:  OT Short Term Goal 1 (Week 1): Pt will be able to rise to stand with min - mod A to prep for clothing management with toileting. OT Short Term Goal 2 (Week 1): Pt will be able to squat pivot to toilet or BSC with mod- max A. OT Short Term Goal 3 (Week 1): Pt will don shirt with set up. OT Short Term Goal 4 (Week 1): Pt will don pants with mod-max A.  Skilled Therapeutic Interventions/Progress Updates:    Upon entering the room, pt supine in bed and reports he did not sleep well but no c/o pain this morning. Pt is agreeable to OT intervention. Pt performs supine >sit with min A to EOB. Pt does not report any dizziness while sitting. OT set up breakfast tray and pt taking 2 bites of food before he began vomiting into emesis bag. Pt vomiting for several minutes and reports, " This is what happens every time I try to eat my food". After nausea subsided, OT assisted pt back into bed per his request. Sit >supine with mod A for B LEs. OT repositioning pt in bed for comfort. Call bell left within reach as well as emesis bag. Bed alarm activated. 35 missed minutes secondary to nausea and vomiting.   Therapy Documentation Precautions:  Precautions Precautions: Fall Precaution Booklet Issued: Yes (comment) Precaution Comments: monitor for orthostatic hypotension Restrictions Weight Bearing Restrictions: No RUE Weight Bearing: Weight bearing as tolerated RUE Partial Weight Bearing Percentage or Pounds: No restrictions Other Position/Activity Restrictions: no longer has sternal precautions or UE PWB precautions - confirmed with Dr. Dagoberto Ligas on 4/9 General: General OT Amount of Missed Time: 35 Minutes Vital Signs: Therapy Vitals Temp: 98 F (36.7 C) Pulse  Rate: 83 Resp: 18 BP: (!) 129/46 Patient Position (if appropriate): Lying Oxygen Therapy SpO2: 98 % O2 Device: Nasal Cannula O2 Flow Rate (L/min): 1 L/min   Therapy/Group: Individual Therapy  Gypsy Decant 05/27/2019, 8:38 AM

## 2019-05-28 ENCOUNTER — Inpatient Hospital Stay (HOSPITAL_COMMUNITY): Payer: Medicare Other | Admitting: Speech Pathology

## 2019-05-28 ENCOUNTER — Inpatient Hospital Stay (HOSPITAL_COMMUNITY): Payer: Medicare Other

## 2019-05-28 ENCOUNTER — Inpatient Hospital Stay (HOSPITAL_COMMUNITY): Payer: Medicare Other | Admitting: Occupational Therapy

## 2019-05-28 ENCOUNTER — Inpatient Hospital Stay (HOSPITAL_COMMUNITY): Payer: Medicare Other | Admitting: Psychology

## 2019-05-28 DIAGNOSIS — N186 End stage renal disease: Secondary | ICD-10-CM

## 2019-05-28 DIAGNOSIS — I5021 Acute systolic (congestive) heart failure: Secondary | ICD-10-CM

## 2019-05-28 DIAGNOSIS — D649 Anemia, unspecified: Secondary | ICD-10-CM

## 2019-05-28 DIAGNOSIS — Z9981 Dependence on supplemental oxygen: Secondary | ICD-10-CM

## 2019-05-28 DIAGNOSIS — R627 Adult failure to thrive: Secondary | ICD-10-CM

## 2019-05-28 DIAGNOSIS — D696 Thrombocytopenia, unspecified: Secondary | ICD-10-CM

## 2019-05-28 DIAGNOSIS — L239 Allergic contact dermatitis, unspecified cause: Secondary | ICD-10-CM

## 2019-05-28 DIAGNOSIS — R1310 Dysphagia, unspecified: Secondary | ICD-10-CM

## 2019-05-28 DIAGNOSIS — Z992 Dependence on renal dialysis: Secondary | ICD-10-CM

## 2019-05-28 LAB — GLUCOSE, CAPILLARY
Glucose-Capillary: 150 mg/dL — ABNORMAL HIGH (ref 70–99)
Glucose-Capillary: 137 mg/dL — ABNORMAL HIGH (ref 70–99)
Glucose-Capillary: 120 mg/dL — ABNORMAL HIGH (ref 70–99)
Glucose-Capillary: 97 mg/dL (ref 70–99)

## 2019-05-28 LAB — RENAL FUNCTION PANEL
Albumin: 1.9 g/dL — ABNORMAL LOW (ref 3.5–5.0)
Anion gap: 10 (ref 5–15)
BUN: 78 mg/dL — ABNORMAL HIGH (ref 8–23)
CO2: 26 mmol/L (ref 22–32)
Calcium: 7.8 mg/dL — ABNORMAL LOW (ref 8.9–10.3)
Chloride: 86 mmol/L — ABNORMAL LOW (ref 98–111)
Creatinine, Ser: 5.37 mg/dL — ABNORMAL HIGH (ref 0.61–1.24)
GFR calc Af Amer: 10 mL/min — ABNORMAL LOW (ref >60)
GFR calc non Af Amer: 9 mL/min — ABNORMAL LOW (ref >60)
Glucose, Bld: 122 mg/dL — ABNORMAL HIGH (ref 70–99)
Phosphorus: 2.4 mg/dL — ABNORMAL LOW (ref 2.5–4.6)
Potassium: 3.9 mmol/L (ref 3.5–5.1)
Sodium: 122 mmol/L — ABNORMAL LOW (ref 135–145)

## 2019-05-28 LAB — CBC
HCT: 30 % — ABNORMAL LOW (ref 39.0–52.0)
Hemoglobin: 9.3 g/dL — ABNORMAL LOW (ref 13.0–17.0)
MCH: 28.4 pg (ref 26.0–34.0)
MCHC: 31 g/dL (ref 30.0–36.0)
MCV: 91.7 fL (ref 80.0–100.0)
Platelets: 135 10*3/uL — ABNORMAL LOW (ref 150–400)
RBC: 3.27 MIL/uL — ABNORMAL LOW (ref 4.22–5.81)
RDW: 17.9 % — ABNORMAL HIGH (ref 11.5–15.5)
WBC: 10.8 10*3/uL — ABNORMAL HIGH (ref 4.0–10.5)
nRBC: 0 % (ref 0.0–0.2)

## 2019-05-28 MED ORDER — PROMETHAZINE HCL 12.5 MG PO TABS
12.5000 mg | ORAL_TABLET | Freq: Four times a day (QID) | ORAL | Status: DC | PRN
  Administered 2019-05-29: 12.5 mg via ORAL
  Filled 2019-05-28 (×3): qty 1

## 2019-05-28 MED ORDER — RENA-VITE PO TABS
1.0000 | ORAL_TABLET | Freq: Every day | ORAL | Status: DC
  Administered 2019-05-28 – 2019-06-13 (×17): 1 via ORAL
  Filled 2019-05-28 (×17): qty 1

## 2019-05-28 MED ORDER — ALTEPLASE 2 MG IJ SOLR: 2.0000 mg | Freq: Once | INTRAMUSCULAR | Status: DC | PRN

## 2019-05-28 MED ORDER — ACETAMINOPHEN 160 MG/5ML PO SOLN
325.0000 mg | ORAL | Status: DC | PRN
  Administered 2019-05-29 – 2019-06-13 (×7): 325 mg via ORAL
  Filled 2019-05-28 (×9): qty 20.3

## 2019-05-28 MED ORDER — OXYCODONE HCL 5 MG/5ML PO SOLN
5.0000 mg | ORAL | Status: DC | PRN
  Administered 2019-05-29 – 2019-06-11 (×3): 5 mg
  Filled 2019-05-28 (×5): qty 5

## 2019-05-28 MED ORDER — CALCIUM ACETATE (PHOS BINDER) 667 MG/5ML PO SOLN
667.0000 mg | Freq: Three times a day (TID) | ORAL | Status: DC
  Administered 2019-05-28 – 2019-06-02 (×12): 667 mg via ORAL
  Filled 2019-05-28 (×17): qty 5

## 2019-05-28 MED ORDER — SODIUM CHLORIDE 0.9 % IV SOLN: 100.0000 mL | INTRAVENOUS | Status: DC | PRN

## 2019-05-28 NOTE — Progress Notes (Signed)
[place] PHYSICAL MEDICINE & REHABILITATION PROGRESS NOTE  Subjective/Complaints: Patient seen sitting up in bed this morning.  Discussed swallowing and nutrition with therapies.  Patient states he slept well overnight.  He states he had some leg pain, but that is his baseline.  ROS: Denies CP, SOB, N/V/D  Objective:   No results found. No results for input(s): WBC, HGB, HCT, PLT in the last 72 hours. No results for input(s): NA, K, CL, CO2, GLUCOSE, BUN, CREATININE, CALCIUM in the last 72 hours.  Intake/Output Summary (Last 24 hours) at 05/28/2019 1209 Last data filed at 05/28/2019 0830 Gross per 24 hour  Intake 0 ml  Output -  Net 0 ml     Physical Exam: Vital Signs Blood pressure (!) 114/42, pulse 76, temperature 97.7 F (36.5 C), resp. rate 16, height 5\' 8"  (1.727 m), weight 91.8 kg, SpO2 99 %. Constitutional: No distress . Vital signs reviewed. HENT: Normocephalic.  Atraumatic.  + NG.  + Stoma with dressing Eyes: EOMI. No discharge. Cardiovascular: No JVD. Respiratory: Increased work of breathing.  No stridor.  + [place].  + Pleurx. GI: Non-distended. Skin: Diffuse macular rash Psych: Flat. Musc: Generalized edema.  No tenderness. Neuro: Alert Motor: 4 --4/5 throughout  Assessment/Plan: 1. Functional deficits secondary to debility which require 3+ hours per day of interdisciplinary therapy in a comprehensive inpatient rehab setting.  Physiatrist is providing close team supervision and 24 hour management of active medical problems listed below.  Physiatrist and rehab team continue to assess barriers to discharge/monitor patient progress toward functional and medical goals  Care Tool:  Bathing    Body parts bathed by patient: Right arm, Left arm, Chest, Abdomen, Right upper leg, Left upper leg   Body parts bathed by helper: Right lower leg, Left lower leg, Buttocks, Front perineal area     Bathing assist Assist Level: Moderate Assistance - Patient 50 - 74%      Upper Body Dressing/Undressing Upper body dressing   What is the patient wearing?: Hospital gown only    Upper body assist Assist Level: Minimal Assistance - Patient > 75%    Lower Body Dressing/Undressing Lower body dressing      What is the patient wearing?: Incontinence brief     Lower body assist Assist for lower body dressing: Total Assistance - Patient < 25%     Toileting Toileting    Toileting assist Assist for toileting: Total Assistance - Patient < 25%     Transfers Chair/bed transfer  Transfers assist  Chair/bed transfer activity did not occur: Safety/medical concerns(nausea/dizziness/fatigue)  Chair/bed transfer assist level: Moderate Assistance - Patient 50 - 74%     Locomotion Ambulation   Ambulation assist   Ambulation activity did not occur: Safety/medical concerns(nausea/dizziness/fatigue)  Assist level: Moderate Assistance - Patient 50 - 74% Assistive device: Walker-rolling Max distance: 5 ft   Walk 10 feet activity   Assist  Walk 10 feet activity did not occur: Safety/medical concerns        Walk 50 feet activity   Assist Walk 50 feet with 2 turns activity did not occur: Safety/medical concerns         Walk 150 feet activity   Assist Walk 150 feet activity did not occur: Safety/medical concerns         Walk 10 feet on uneven surface  activity   Assist Walk 10 feet on uneven surfaces activity did not occur: Safety/medical concerns         Wheelchair  Assist Will patient use wheelchair at discharge?: No(sternal precautions) Type of Wheelchair: Manual    Wheelchair assist level: Supervision/Verbal cueing Max wheelchair distance: 50 ft    Wheelchair 50 feet with 2 turns activity    Assist        Assist Level: Supervision/Verbal cueing   Wheelchair 150 feet activity     Assist          Blood pressure (!) 114/42, pulse 76, temperature 97.7 F (36.5 C), resp. rate 16, height 5\' 8"   (1.727 m), weight 91.8 kg, SpO2 99 %.  Medical Problem List and Plan: 1.Debilitysecondary to CAD status post CABG 03/15/2019. Impella device removed 04/10/2019. Sternal precautions  Continue CIR  2. Antithrombotics: -DVT/anticoagulation:SCDs- due to hematuria -antiplatelet therapy: Aspirin 81 mg daily 3. Pain Management:Oxycodone as needed  Complained of leg pain, but not taking as needed medications. 4. Mood:Provide emotional support -antipsychotic agents: N/A 5. Neuropsych: This patientiscapable of making decisions on hisown behalf. 6. Skin/Wound Care:Routine skin checks 7. Fluids/Electrolytes/Nutrition:Monitor I/Os 8. Acute systolic congestive heart failure/cardiogenic shock. Follow-up cardiology services Filed Weights   05/26/19 0636 05/27/19 0548 05/28/19 0640  Weight: 89.4 kg 90.3 kg 91.8 kg   Trending up on 4/12 9. Acute hypoxic respiratory failure with Pseudomonas pneumonia. Status post tracheostomy 03/27/2019. Decannulated 04/28/2019.  Wean supplemental oxygen as tolerated 10.Acute on chronic anemia/thrombocytopenia. No more heparin for HD for now  Hemoglobin-10.5 on 4/7, will plan for labs if not performed in HD today  Platelets 110 on 4/7  HIT negative 11. Dysphagia/nausea. Dysphagia #2 thin liquids. Follow-up speech therapy.Nasogastric tube/cortrakfor nutritional support.   Dietary follow-up- see dietary note dated 4/6 for additional information.   -PEG not safe option as per cardiothoracic surgery team.   4/9- Added Phenergan 12.5 mg Q6 hours prn for nausea since zofran doesn't workAdded scopolamine patch for nausea and dizziness.   Nausea improved, but limited p.o. intake  KUB ordered  Encourage patient to have son bring in food that patient enjoys and has it modified 12. 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 Patient has been transitioned to dialysis and being sent to OP HD after CIR, so is chronicand followed by  renal services. Tunneled L IJcatheter placed 04/24/2019, exchanged 13. Hematuria. Cystoscopy completed with evacuation of clot. No further bleeding episodes per notes, intermittent bleeding.   Continue to monitor 14. Left pleural effusion. Status post Pleurx catheter 04/23/2019.PATIENT WILL KEEP PLEURX CATHETER FOR NOW PER CVTS.Currently being drained Mondaysand Thursdays 15. Diffuse drug rashversus contact dermatitis. Appears to be resolving. Steroid taper completed   Benadryl prn, Sarna lotion, hypoallergenic sheets.   Improving per report 16.Rectal bleeding. 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 Follow-up GI with conservative care no further bleeding reported. 17. PAF. Patient remains normal sinus rhythm. Currently off amiodarone. No plan to resume any anticoagulation at this time until hematuria fully resolved.  Heart rate controlled on 4/12  LOS: 6 days A FACE TO FACE EVALUATION WAS PERFORMED  Teagan Heidrick Lorie Phenix 05/28/2019, 12:09 PM

## 2019-05-28 NOTE — Progress Notes (Signed)
Occupational Therapy Session Note  Patient Details  Name: Jesse Murphy MRN: [id number] Date of Birth: March 20, 1928  Today's Date: 05/28/2019 OT Individual Time: 6314-9702 OT Individual Time Calculation (min): 55 min    Short Term Goals: Week 1:  OT Short Term Goal 1 (Week 1): Pt will be able to rise to stand with min - mod A to prep for clothing management with toileting. OT Short Term Goal 2 (Week 1): Pt will be able to squat pivot to toilet or BSC with mod- max A. OT Short Term Goal 3 (Week 1): Pt will don shirt with set up. OT Short Term Goal 4 (Week 1): Pt will don pants with mod-max A.  Skilled Therapeutic Interventions/Progress Updates:    Pt received in bed and agreeable to trying some therapy.  Pt able to sit to EOB, but stated dizziness with 8-9/10.  After 15-20 minutes dizziness did not resolve.  Pt stated he needed to lay down.    ADL Retraining: Pt declined this am, stating he was just washed up and changed by nursing.  He did not want to put on scrub disposable clothing due to the texture. Requested to stay in his gown.  Significant edema in feet. Used ACE wraps vs applying TED hose.  Therapeutic Activity/ Exercise:   Sitting EOB, pt worked on AROM exercises with knee extensions and shoulders. Very limited A/PROM in B shoulders that pt states is new from his hospitalization.   From supine, continued PROM for scapula and A/arom of shoulders.  arom of overhead tricep extension with support through shoulder.  Hands with significant edema - retrograde massage to both. Encouraged pt to do active finger pumps throughout the day.  Elevated hands on pillows.    Transfers:  With cues for rolling (turning head, rotation through shoulders and flexed hips and knees) pt able to roll both directions with CGA and move from sidelying to sit with min A.  After he completed exercises from EOB, he worked on scoots to his L 8x to move closer to his pillow with min A. Pt able to weight shift  forward but could only lift hips slightly so each scoot was very small.  Min A back to supine.   Balance: supervision sit balance EOB, pt too dizzy to try standing  Pt resting in bed with all needs met.  Bed alarm set.     Therapy Documentation Precautions:  Precautions Precautions: Fall Precaution Booklet Issued: Yes (comment) Precaution Comments: monitor for orthostatic hypotension Restrictions Weight Bearing Restrictions: No RUE Weight Bearing: Weight bearing as tolerated RUE Partial Weight Bearing Percentage or Pounds: No restrictions Other Position/Activity Restrictions: no longer has sternal precautions or UE PWB precautions - confirmed with Dr. Dagoberto Ligas on 4/9  Pain: Pain Assessment Pain Scale: 0-10 Pain Score: 9  Pain Type: Acute pain Pain Location: Leg Pain Orientation: Left;Right Pain Descriptors / Indicators: Throbbing Pain Frequency: Constant Pain Onset: On-going Patients Stated Pain Goal: 2 Pain Intervention(s): Repositioned(refused pain med)   Therapy/Group: Individual Therapy  [place] 05/28/2019, 9:09 AM

## 2019-05-28 NOTE — Procedures (Signed)
Patient seen on Hemodialysis. BP (!) 130/52   Pulse 83   Temp 97.8 F (36.6 C) (Oral)   Resp 19   Ht 5\' 8"  (1.727 m)   Wt 89.4 kg   SpO2 99%   BMI 29.97 kg/m   QB 400, UF goal 2.5L Tolerating treatment without complaints at this time.   Elmarie Shiley MD [facility]. Office # [phone] Pager # [phone] 1:56 PM

## 2019-05-28 NOTE — Progress Notes (Signed)
Patient ID: Jesse Murphy, male   DOB: 14-Nov-1928, 86 y.o.   MRN: [id number]  [facility] Progress Note   Assessment/ Plan:   1.  End-stage renal disease following persistent renal replacement dependent acute kidney Injury: With hemodynamically mediated with ATN following postoperative congestive heart failure/cardiogenic shock.  On renal replacement therapy since 2/11 and appears to be tolerating intermittent hemodialysis without problems following transition from intermittent hemodialysis.  He will undergo hemodialysis today to continue dialysis on a MWF schedule; urine output not charted and will check labs today. 2.  Acute systolic heart failure/cardiogenic shock postoperatively: Transiently required Impella support.  Continue to monitor hemodynamic status. 3.  Coronary artery disease status post three-vessel CABG 4.  Anemia: With acute/critical illness and previously significant hematuria. 5.  Nutrition: Low albumin likely negative acute phase reactant, monitor with ongoing diet/ONS. 6.  Secondary hyperparathyroidism: Calcium and phosphorus levels currently at goal on calcium acetate, monitor with HD. 7.  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  Subjective:   Reports to be feeling fair, per therapist at bedside-he has not been participating adequately with PT/OT.   Objective:   BP (!) 114/42 (BP Location: Right Arm)   Pulse 76   Temp 97.7 F (36.5 C)   Resp 16   Ht 5\' 8"  (1.727 m)   Wt 91.8 kg   SpO2 99%   BMI 30.77 kg/m   Intake/Output Summary (Last 24 hours) at 05/28/2019 0844 Last data filed at 05/27/2019 1700 Gross per 24 hour  Intake 0 ml  Output --  Net 0 ml   Weight change: 1.5 kg  Physical Exam: Gen: Resting comfortably in bed, RN at bedside.. CVS: Pulse regular rhythm, normal rate, S1 and S2 normal Resp: Anteriorly clear to auscultation, no rales.  Left IJ TDC Abd: Soft, obese,  nontender Ext: Trace dependent edema.  No lower extremity edema.  Imaging: No results found.  Labs: BMET Recent Labs  Lab 05/22/19 0235 05/22/19 1651 05/23/19 1455 05/25/19 0514  NA 133* 131* 133* 126*  K 4.2 4.9 4.2 3.8  CL 95* 96* 96* 90*  CO2 22 22 23 25   GLUCOSE 161* 220* 168* 159*  BUN 135* 149* 147* 96*  CREATININE 6.29* 7.12* 6.92* 5.54*  CALCIUM 7.9* 7.7* 7.8* 7.6*  PHOS 6.4* 6.3* 5.3*  --    CBC Recent Labs  Lab 05/22/19 0235 05/22/19 1651 05/23/19 1455  WBC 8.7 5.2 8.9  HGB 11.0* 11.1* 10.5*  HCT 35.6* 35.7* 33.4*  MCV 93.7 92.5 92.3  PLT 99* 101* 110*    Medications:    . aspirin  81 mg Oral Daily  . calcium acetate (Phos Binder)  667 mg Per Tube TID WC  . Chlorhexidine Gluconate Cloth  6 each Topical Q0600  . famotidine  10 mg Oral Daily  . feeding supplement (PRO-STAT SUGAR FREE 64)  60 mL Per Tube BID  . Gerhardt's butt cream   Topical BID  . hydrocortisone   Rectal BID  . hydrocortisone  25 mg Rectal BID  . hydrocortisone cream   Topical BID  . insulin aspart  0-15 Units Subcutaneous TID WC  . multivitamin  1 tablet Per Tube QHS  . ondansetron  4 mg Oral Once  . scopolamine  1 patch Transdermal Q72H  . simethicone  80 mg Oral TID   Elmarie Shiley, MD 05/28/2019, 8:44 AM

## 2019-05-28 NOTE — Progress Notes (Signed)
Physical Therapy Session Note  Patient Details  Name: Jesse Murphy MRN: [id number] Date of Birth: [birth year]  Today's Date: 05/28/2019 PT Individual Time: 3143-8887 PT Individual Time Calculation (min): 42 min   Short Term Goals: Week 1:  PT Short Term Goal 1 (Week 1): Pt will perform bed<>chair transfer min assist with LRAD PT Short Term Goal 2 (Week 1): Pt will ambulate x 10 ft with LRAD and min assist PT Short Term Goal 3 (Week 1): Pt will tolerate sitting up in w/c or recliner OOB >1 hr PT Short Term Goal 4 (Week 1): Pt will perform sit<>stand with LRAD and min assist, maintaining sternal precuations  Skilled Therapeutic Interventions/Progress Updates:    Pt supine in bed upon PT arrival, agreeable to therapy tx (with encouragement) and denies pain. Does report some dizziness in sitting, does not worsen with mobility today. Pt transferred to sitting EOB this session with mod assist for trunk elevation, cues for techniques. Pt seated EOB this session with supervision while waiting for dizziness to subside. Mod assist squat pivot to the w/c with cues for techniques and reaching across for armrest. Pt transported outside off the unit this session. While seated in w/c outside pt performed exercises for strength and endurance, 2 x 10 each - LAQ, ankle pumps, marches and UE raises. Pt transported back inside. Pt performed x2 bouts of w/c propulsion this session (40 ft and x 55 ft) working on UE strength, endurance and activity tolerance. Pt requesting to go back to bed at end of session despite encouragement for staying OOB in w/c or recliner. Set goal to sit up tomorrow fo 1 hour, pt agreeable. Pt ambulated x 8 ft back to bed with RW and mod assist. Pt transferred to supine with mod assist for LE management. Pt reports having to use bedpan, rolling min assist with bed pan placed under him and left in care of RN.   Therapy Documentation Precautions:  Precautions Precautions: Fall Precaution  Booklet Issued: Yes (comment) Precaution Comments: monitor for orthostatic hypotension Restrictions Weight Bearing Restrictions: No RUE Weight Bearing: Weight bearing as tolerated RUE Partial Weight Bearing Percentage or Pounds: No restrictions Other Position/Activity Restrictions: no longer has sternal precautions or UE PWB precautions - confirmed with Dr. Dagoberto Ligas on 4/9    Therapy/Group: Individual Therapy  Netta Corrigan, PT, DPT, CSRS 05/28/2019, 7:36 AM

## 2019-05-28 NOTE — Progress Notes (Signed)
Speech Language Pathology Daily Session Note  Patient Details  Name: Jesse Murphy MRN: [id number] Date of Birth: [birth year]  Today's Date: 05/28/2019 SLP Individual Time: 1225-1305 SLP Individual Time Calculation (min): 40 min  Short Term Goals: Week 1: SLP Short Term Goal 1 (Week 1): Pt will consume current diet with minimal s/sx aspiration and efficient mastication and oral clearance with Min A verbal/visual cues for use of compensatory strategies SLP Short Term Goal 2 (Week 1): Pt will sustain attention to functional tasks with Min A verbal/visual cues for reidrection SLP Short Term Goal 3 (Week 1): Pt will use external aids to orient to date with Min A verbal/visual cues SLP Short Term Goal 4 (Week 1): Pt will demonstrate ability to problem solve basic familiar tasks with Min A verbal/visual cues  Skilled Therapeutic Interventions: Pt was seen for skilled ST targeting swallow goals. Pt more receptive to education regarding increasing PO intake today and implications of relying on NGT for nutrition and medication administration (only a short term solution). Pt agreeable to attempt advanced PO trials of Dys 3 solid (graham cracker) and thin liquids (water). Pt implemented chin tuck with 100% accuracy with Supervision A verbal cueing throughout intake, and with no overt s/sx aspiration. Although after pt accepted graham cracker, he did dry heave and expectorate a portion of an unmasticated cracker, no emesis was produced. Due to deconditioning, would recommend pt upgrade to conservative Dysphagia 2 (minced/chopped) diet, continue thin liquids with chin tuck strategy, medications may be administered whole in puree.  Transport arrived to take pt to hemodialysis and therefore he missed remaining 20 minutes of session.  Although the additional 20 minutes were not billable, the time was spent with pt's son and PA discussing dysphagia complicated by nausea and options for PO diet, and both in verbal  agreement (in addition to pt) with SLP's recommendation to reinstate Dys 2 solid diet, medications whole in puree, thin liquids with chin tuck and to work toward removal of NGT removal.       Pain Pain Assessment Pain Scale: Faces Faces Pain Scale: No hurt  Therapy/Group: Individual Therapy  Arbutus Leas 05/28/2019, 7:15 AM

## 2019-05-28 NOTE — Plan of Care (Signed)
  Problem: Consults Goal: RH GENERAL PATIENT EDUCATION Description: See Patient Education module for education specifics. Outcome: Progressing Goal: Skin Care Protocol Initiated - if Braden Score 18 or less Description: If consults are not indicated, leave blank or document N/A Outcome: Progressing Goal: Nutrition Consult-if indicated Outcome: Progressing Goal: Diabetes Guidelines if Diabetic/Glucose > 140 Description: If diabetic or lab glucose is > 140 mg/dl - Initiate Diabetes/Hyperglycemia Guidelines & Document Interventions  Outcome: Progressing   Problem: RH BOWEL ELIMINATION Goal: RH STG MANAGE BOWEL WITH ASSISTANCE Description: STG Manage Bowel with min Assistance. Outcome: Progressing Goal: RH STG MANAGE BOWEL W/MEDICATION W/ASSISTANCE Description: STG Manage Bowel with Medication with min Assistance. Outcome: Progressing   Problem: RH BLADDER ELIMINATION Goal: RH STG MANAGE BLADDER WITH ASSISTANCE Description: STG Manage Bladder With min/mod Assistance Outcome: Progressing   Problem: RH SKIN INTEGRITY Goal: RH STG SKIN FREE OF INFECTION/BREAKDOWN Description: Patients skin will remain free from FURTHER breakdown or infection with min/mod assist. Outcome: Progressing Goal: RH STG MAINTAIN SKIN INTEGRITY WITH ASSISTANCE Description: STG Maintain Skin Integrity With min/mod Assistance. Outcome: Progressing Goal: RH STG ABLE TO PERFORM INCISION/WOUND CARE W/ASSISTANCE Description: STG Able To Perform Incision/Wound Care With total assistance from caregiver. Outcome: Progressing   Problem: RH SAFETY Goal: RH STG ADHERE TO SAFETY PRECAUTIONS W/ASSISTANCE/DEVICE Description: STG Adhere to Safety Precautions With min Assistance/Device. Outcome: Progressing   Problem: RH PAIN MANAGEMENT Goal: RH STG PAIN MANAGED AT OR BELOW PT'S PAIN GOAL Description: < 4 Outcome: Progressing

## 2019-05-28 NOTE — Progress Notes (Signed)
Patient was weaned off O2, satting at 96% on room air. [name] was removed and educated patient and patien's son about calorie counting.

## 2019-05-28 NOTE — Progress Notes (Signed)
After long discussion with patient and son in regards to patient's p.o. intake current plan is to remove NG tube check calorie counts monitor patient's intake.  We will need to discuss possible need for gastrostomy tube if patient does not maintain adequate nutritional support

## 2019-05-28 NOTE — Plan of Care (Signed)
  Problem: Consults Goal: RH GENERAL PATIENT EDUCATION Description: See Patient Education module for education specifics. Outcome: Progressing Goal: Skin Care Protocol Initiated - if Braden Score 18 or less Description: If consults are not indicated, leave blank or document N/A Outcome: Progressing Goal: Nutrition Consult-if indicated Outcome: Progressing Goal: Diabetes Guidelines if Diabetic/Glucose > 140 Description: If diabetic or lab glucose is > 140 mg/dl - Initiate Diabetes/Hyperglycemia Guidelines & Document Interventions  Outcome: Progressing   Problem: RH BOWEL ELIMINATION Goal: RH STG MANAGE BOWEL WITH ASSISTANCE Description: STG Manage Bowel with min Assistance. Outcome: Progressing Goal: RH STG MANAGE BOWEL W/MEDICATION W/ASSISTANCE Description: STG Manage Bowel with Medication with min Assistance. Outcome: Progressing   Problem: RH BLADDER ELIMINATION Goal: RH STG MANAGE BLADDER WITH ASSISTANCE Description: STG Manage Bladder With min/mod Assistance Outcome: Progressing

## 2019-05-29 ENCOUNTER — Inpatient Hospital Stay (HOSPITAL_COMMUNITY): Payer: Medicare Other | Admitting: Physical Therapy

## 2019-05-29 ENCOUNTER — Inpatient Hospital Stay (HOSPITAL_COMMUNITY): Payer: Medicare Other | Admitting: Speech Pathology

## 2019-05-29 ENCOUNTER — Inpatient Hospital Stay (HOSPITAL_COMMUNITY): Payer: Medicare Other | Admitting: Occupational Therapy

## 2019-05-29 LAB — GLUCOSE, CAPILLARY
Glucose-Capillary: 94 mg/dL (ref 70–99)
Glucose-Capillary: 103 mg/dL — ABNORMAL HIGH (ref 70–99)
Glucose-Capillary: 90 mg/dL (ref 70–99)
Glucose-Capillary: 101 mg/dL — ABNORMAL HIGH (ref 70–99)

## 2019-05-29 MED ORDER — NEPRO/CARBSTEADY PO LIQD
237.0000 mL | Freq: Three times a day (TID) | ORAL | Status: DC
  Administered 2019-05-29 (×2): 237 mL via ORAL

## 2019-05-29 MED ORDER — PRO-STAT SUGAR FREE PO LIQD
30.0000 mL | Freq: Three times a day (TID) | ORAL | Status: DC
  Filled 2019-05-29 (×2): qty 30

## 2019-05-29 NOTE — Progress Notes (Signed)
Nutrition Follow-up  DOCUMENTATION CODES:   Not applicable  INTERVENTION:   Calorie count per PA  Nepro Shake po TID, each supplement provides 425 kcal and 19 grams protein  74m Pro-stat TID  Continue MVI daily   NUTRITION DIAGNOSIS:   Increased nutrient needs related to wound healing as evidenced by estimated needs.  Ongoing.  GOAL:   Patient will meet greater than or equal to 90% of their needs  Not met, tube feeding discontinued.   MONITOR:   PO intake, Supplement acceptance, Skin, I & O's, Diet advancement, Labs, Weight trends  REASON FOR ASSESSMENT:   Consult Enteral/tube feeding initiation and management  ASSESSMENT:   Patient with PMH significant for CAD s/p PCI, prostate cancer, HTN, CHF, and DM. Presented this admission with chronic a.fib with RVR. Pt admitted to CIR 05/22/19.  1/28- s/p L heart cath  1/30- s/p CABG x3, MAZE, extubated  2/2- re-intubated  2/5- extubated 2/8- re-intubated  2/10- s/p impella, trach, NGT placed-start trickle feeding 2/11- start CRRT 2/23- removal of impella, [name] dislodged  2/24- CRRT stop 3/1- failed iHD, back on CRRT 3/2- s/p [name], nocturnal feedings started 3/4- diet advanced DYS2 , nectar thick  3/8- s/p L PleurX catheter and L TDC 3/10- tolerated iHD 3/13- s/p decannulation  3/15- s/p cystoscopy  3/17- [name] clogged, s/p replacement 4/8 - [name] removed, NGT placed 4/12 - NGT removed, TF discontinued   Per PA, decision was made yesterday to remove the [name] and conduct a calorie count to monitor patient's PO intake. If pt isn't able to maintain adequate nutrition, G-tube placement will be discussed. Of note, pt has consumed 0-5% x last 8 recorded meals (average intake of 1%). It is unlikely that pt will consume enough to meet his calorie and protein needs; however, RD will order supplements in hopes of boosting pt's calorie/protein intake. Per SLP, nursing is to present a couple items from the meal  tray at a time to the patient.   Pt not answering RD questions regarding appetite and intake at this time.   Medications reviewed and include: Phoslyra, Pepcid, Novolog, Rena-vit, Zofran   Labs reviewed: Na 122 (L), Phosphorus 2.4 (L), CBGs 94-120   NUTRITION - FOCUSED PHYSICAL EXAM:    Most Recent Value  Orbital Region  Mild depletion  Upper Arm Region  No depletion  Thoracic and Lumbar Region  No depletion  Buccal Region  Mild depletion  Temple Region  Moderate depletion  Clavicle Bone Region  No depletion  Clavicle and Acromion Bone Region  No depletion  Scapular Bone Region  No depletion  Dorsal Hand  No depletion  Patellar Region  No depletion  Anterior Thigh Region  No depletion  Posterior Calf Region  No depletion  Edema (RD Assessment)  Moderate  Hair  Reviewed  Eyes  Reviewed  Mouth  Reviewed  Skin  Reviewed  Nails  Reviewed       Diet Order:   Diet Order            DIET DYS 2 Room service appropriate? Yes; Fluid consistency: Thin  Diet effective now              EDUCATION NEEDS:   Not appropriate for education at this time  Skin:  Skin Integrity Issues:: Stage II, DTI DTI: R heel Stage II: L/R ears, L/R buttocks, L elbow Incisions: perineum, R leg  Last BM:  4/12  Height:   Ht Readings from Last 1 Encounters:  05/22/19 '5\' 8"'  (  1.727 m)    Weight:   Wt Readings from Last 1 Encounters:  05/29/19 89.5 kg    BMI:  Body mass index is 30 kg/m.  Estimated Nutritional Needs:   Kcal:  2200-2400  Protein:  135-155 grams  Fluid:  >/= 2.2 L/d    Larkin Ina, MS, RD, LDN RD pager number and weekend/on-call pager number located in [place].

## 2019-05-29 NOTE — Plan of Care (Signed)
  Problem: Consults Goal: RH GENERAL PATIENT EDUCATION Description: See Patient Education module for education specifics. Outcome: Progressing Goal: Skin Care Protocol Initiated - if Braden Score 18 or less Description: If consults are not indicated, leave blank or document N/A Outcome: Progressing Goal: Nutrition Consult-if indicated Outcome: Progressing Goal: Diabetes Guidelines if Diabetic/Glucose > 140 Description: If diabetic or lab glucose is > 140 mg/dl - Initiate Diabetes/Hyperglycemia Guidelines & Document Interventions  Outcome: Progressing   Problem: RH BOWEL ELIMINATION Goal: RH STG MANAGE BOWEL WITH ASSISTANCE Description: STG Manage Bowel with min Assistance. Outcome: Progressing Goal: RH STG MANAGE BOWEL W/MEDICATION W/ASSISTANCE Description: STG Manage Bowel with Medication with min Assistance. Outcome: Progressing   Problem: RH BLADDER ELIMINATION Goal: RH STG MANAGE BLADDER WITH ASSISTANCE Description: STG Manage Bladder With min/mod Assistance Outcome: Progressing   Problem: RH SKIN INTEGRITY Goal: RH STG SKIN FREE OF INFECTION/BREAKDOWN Description: Patients skin will remain free from FURTHER breakdown or infection with min/mod assist. Outcome: Progressing Goal: RH STG MAINTAIN SKIN INTEGRITY WITH ASSISTANCE Description: STG Maintain Skin Integrity With min/mod Assistance. Outcome: Progressing Goal: RH STG ABLE TO PERFORM INCISION/WOUND CARE W/ASSISTANCE Description: STG Able To Perform Incision/Wound Care With total assistance from caregiver. Outcome: Progressing   Problem: RH SAFETY Goal: RH STG ADHERE TO SAFETY PRECAUTIONS W/ASSISTANCE/DEVICE Description: STG Adhere to Safety Precautions With min Assistance/Device. Outcome: Progressing   Problem: RH PAIN MANAGEMENT Goal: RH STG PAIN MANAGED AT OR BELOW PT'S PAIN GOAL Description: < 4 Outcome: Progressing

## 2019-05-29 NOTE — Progress Notes (Signed)
Speech Language Pathology Daily Session Note  Patient Details  Name: Jesse Murphy MRN: [id number] Date of Birth: 17-Jan-1929  Today's Date: 05/29/2019 SLP Individual Time: 0930-1000 SLP Individual Time Calculation (min): 30 min  Short Term Goals: Week 1: SLP Short Term Goal 1 (Week 1): Pt will consume current diet with minimal s/sx aspiration and efficient mastication and oral clearance with Min A verbal/visual cues for use of compensatory strategies SLP Short Term Goal 2 (Week 1): Pt will sustain attention to functional tasks with Min A verbal/visual cues for reidrection SLP Short Term Goal 3 (Week 1): Pt will use external aids to orient to date with Min A verbal/visual cues SLP Short Term Goal 4 (Week 1): Pt will demonstrate ability to problem solve basic familiar tasks with Min A verbal/visual cues  Skilled Therapeutic Interventions: Skilled treatment session focused on dysphagia and cognitive goals. Upon arrival, patient was awake in bed and appeared brighter with increased social interaction. Patient agreeable to consume a fruit cup. Patient consumed the fruit cup without overt s/s of aspiration and demonstrated efficient mastication with completion oral clearance. No reports of nausea or episodes of gagging during snack. Patient left upright in bed with RN present. Continue with current plan of care.      Pain No/Denies Pain   Therapy/Group: Individual Therapy  Constantina Laseter 05/29/2019, 10:03 AM

## 2019-05-29 NOTE — Plan of Care (Signed)
  Problem: Consults Goal: RH GENERAL PATIENT EDUCATION Description: See Patient Education module for education specifics. Outcome: Progressing Goal: Skin Care Protocol Initiated - if Braden Score 18 or less Description: If consults are not indicated, leave blank or document N/A Outcome: Progressing Goal: Nutrition Consult-if indicated Outcome: Progressing Goal: Diabetes Guidelines if Diabetic/Glucose > 140 Description: If diabetic or lab glucose is > 140 mg/dl - Initiate Diabetes/Hyperglycemia Guidelines & Document Interventions  Outcome: Progressing   Problem: RH BOWEL ELIMINATION Goal: RH STG MANAGE BOWEL WITH ASSISTANCE Description: STG Manage Bowel with min Assistance. Outcome: Progressing Goal: RH STG MANAGE BOWEL W/MEDICATION W/ASSISTANCE Description: STG Manage Bowel with Medication with min Assistance. Outcome: Progressing   Problem: RH BLADDER ELIMINATION Goal: RH STG MANAGE BLADDER WITH ASSISTANCE Description: STG Manage Bladder With min/mod Assistance Outcome: Progressing   Problem: RH SAFETY Goal: RH STG ADHERE TO SAFETY PRECAUTIONS W/ASSISTANCE/DEVICE Description: STG Adhere to Safety Precautions With min Assistance/Device. Outcome: Progressing   Problem: RH PAIN MANAGEMENT Goal: RH STG PAIN MANAGED AT OR BELOW PT'S PAIN GOAL Description: < 4 Outcome: Progressing

## 2019-05-29 NOTE — Progress Notes (Signed)
Occupational Therapy Session Note  Patient Details  Name: SHIV SHUEY MRN: [id number] Date of Birth: 31-Dec-1928  Today's Date: 05/29/2019 OT Individual Time: 7948-0165 OT Individual Time Calculation (min): 60 min    Short Term Goals: Week 1:  OT Short Term Goal 1 (Week 1): Pt will be able to rise to stand with min - mod A to prep for clothing management with toileting. OT Short Term Goal 2 (Week 1): Pt will be able to squat pivot to toilet or BSC with mod- max A. OT Short Term Goal 3 (Week 1): Pt will don shirt with set up. OT Short Term Goal 4 (Week 1): Pt will don pants with mod-max A.  Skilled Therapeutic Interventions/Progress Updates:      Pt seen for BADL retraining of toileting, bathing, and dressing with a focus on Activity tolerance and sit to stand skills. Pt stated he was tired but agreeable to starting therapy.  Sat to EOB with min A and maintained balance in sitting well.  Bathed from EOB with mod A and needed min A to pull shirt over head due to limited shoulder flexion AROM.  Edema in legs has reduced significantly.  Removed ACE wraps and donned his TED hose.  His shoes are too tight so used non slip socks.  Sit to stand to RW with a heavy mod A and then completed stand pivot to [facility] with mod A.  Very limited standing tolerance and max cues to fully push up with his arms.  Pt able to have a small BM.  +2 A with cleansing post toileting, 1 person to A pt in standing, 1 to assist with cleansing and clothing management.  Mod A to stand pivot back to bed with RW.  Min A to move into supine.  O2 sats 91-93% on room air during activity and HR 86.  Pt resting in bed, with all needs met. Bed alarm set.     Therapy Documentation Precautions:  Precautions Precautions: Fall Precaution Booklet Issued: Yes (comment) Precaution Comments: monitor for orthostatic hypotension Restrictions Weight Bearing Restrictions: No RUE Weight Bearing: Weight bearing as tolerated RUE  Partial Weight Bearing Percentage or Pounds: No restrictions Other Position/Activity Restrictions: no longer has sternal precautions or UE PWB precautions - confirmed with Dr. Dagoberto Ligas on 4/9     Pain: Pain Assessment Pain Score: 6  Pain Type: Acute pain Pain Location: Leg Pain Orientation: Right;Left Pain Descriptors / Indicators: Aching Pain Onset: On-going Pain Intervention(s): RN made aware ADL: ADL Upper Body Bathing: Setup Where Assessed-Upper Body Bathing: Edge of bed Lower Body Bathing: Maximal assistance Where Assessed-Lower Body Bathing: Edge of bed Upper Body Dressing: Minimal assistance Where Assessed-Upper Body Dressing: Edge of bed Lower Body Dressing: Dependent Where Assessed-Lower Body Dressing: Edge of bed Toileting: Dependent Where Assessed-Toileting: Bedside Commode Toilet Transfer: Moderate assistance Toilet Transfer Method: Stand pivot Toilet Transfer Equipment: Bedside commode   Therapy/Group: Individual Therapy  [place] 05/29/2019, 9:12 AM

## 2019-05-29 NOTE — Progress Notes (Signed)
Physical Therapy Session Note  Patient Details  Name: Jesse Murphy MRN: [id number] Date of Birth: [birth year]  Today's Date: 05/29/2019 PT Individual Time: 5790-3833 PT Individual Time Calculation (min): 53 min   Short Term Goals: Week 1:  PT Short Term Goal 1 (Week 1): Pt will perform bed<>chair transfer min assist with LRAD PT Short Term Goal 2 (Week 1): Pt will ambulate x 10 ft with LRAD and min assist PT Short Term Goal 3 (Week 1): Pt will tolerate sitting up in w/c or recliner OOB >1 hr PT Short Term Goal 4 (Week 1): Pt will perform sit<>stand with LRAD and min assist, maintaining sternal precuations  Skilled Therapeutic Interventions/Progress Updates:   Pt received supine in bed with RN present and pt agreeable to therapy session. Supine>sitting L EOB, HOB slightly elevated, with max multimodal cuing to perform logroll technique to increase pt independence with min assist for trunk upright. Pt required extended seated rest break prior to performing transfer to chair - during this time therapist educated pt on his increase in SpO2 from 92% lying down to 95% sitting EOB and the importance of upright activity tolerance as well as importance of using incentive spirometer every hour - had pt demonstrate x5 breaths with him only able to lift target to 750. R stand pivot to w/c using RW with mod assist for lifting into standing (delayed trunk/hip extension for upright posture) and mod assist for balance due posterior lean.  Transported to/from gym in w/c for time management and energy conservation. Attempted sit>stand w/c>RW with heavy max assist but pt unable to come upright despite fully clearing hips from chair. Attempted sit>stand again with heavy max assist with pt able to come to a full stand but only tolerated standing ~15seconds prior to returning to sit. 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 Attempted sit>stand a 3rd  time but pt unable to clear hips from w/c despite heavy max assist and 2 trials. Transported to day room. Pt appears very fatigued keeping eyes closed during session. Performed B LE exercise using kinteron against 80cm/sec resistance for 47min10seconds. Pt initially agreeable to attempt sitting OOB in w/c or recliner to eat lunch but due to significantly increased fatigue requested to return to bed. Transported back to room. L squat pivot transfer with max assist for lifting/pivoting hips. Sit>supine via reverse logroll technique to increase independence with min assist for B LE management. Pt left supine in bed with needs in reach and bed alarm on - NT arriving.   Therapy Documentation Precautions:  Precautions Precautions: Fall Precaution Booklet Issued: Yes (comment) Precaution Comments: monitor for orthostatic hypotension Restrictions Weight Bearing Restrictions: No RUE Weight Bearing: Weight bearing as tolerated RUE Partial Weight Bearing Percentage or Pounds: No restrictions Other Position/Activity Restrictions: no longer has sternal precautions or UE PWB precautions - confirmed with Dr. Dagoberto Ligas on 4/9  Pain: Denies pain at this time but does state that his legs were aching last night while he was trying to rest - believes this was from muscle soreness from using kinetron    Therapy/Group: Individual Therapy  Tawana Scale, PT, DPT 05/29/2019, 12:14 PM

## 2019-05-29 NOTE — Progress Notes (Addendum)
[place] PHYSICAL MEDICINE & REHABILITATION PROGRESS NOTE  Subjective/Complaints: Patient seen laying in bed this morning.  He states he slept well overnight.  He states she was able to eat some dinner last night.  He states he has trouble eating while lying down, encourage patient to sit up for meals.  Discussed nutrition with therapies.  ROS: Denies CP, SOB, N/V/D  Objective:   DG Abd 1 View  Result Date: 05/28/2019 CLINICAL DATA:  Failure to thrive EXAM: ABDOMEN - 1 VIEW COMPARISON:  05/24/2019, 03/30/2019 FINDINGS: Lung bases are non included. No feeding tube visible on the current image. Nonobstructed bowel-gas pattern. Probable vascular calcifications in the left upper quadrant. IMPRESSION: Nonobstructed bowel-gas pattern. No feeding tube is visible on the current image. Electronically Signed   By: Donavan Foil M.D.   On: 05/28/2019 19:50   Recent Labs    05/28/19 1320  WBC 10.8*  HGB 9.3*  HCT 30.0*  PLT 135*   Recent Labs    05/28/19 1320  NA 122*  K 3.9  CL 86*  CO2 26  GLUCOSE 122*  BUN 78*  CREATININE 5.37*  CALCIUM 7.8*    Intake/Output Summary (Last 24 hours) at 05/29/2019 1025 Last data filed at 05/29/2019 0833 Gross per 24 hour  Intake 110 ml  Output --  Net 110 ml     Physical Exam: Vital Signs Blood pressure (!) 114/48, pulse 83, temperature 98 F (36.7 C), temperature source Axillary, resp. rate 18, height 5\' 8"  (1.727 m), weight 89.5 kg, SpO2 93 %. Constitutional: No distress . Vital signs reviewed. HENT: Normocephalic.  Atraumatic. Eyes: EOMI. No discharge. Cardiovascular: No JVD. Respiratory: Normal effort.  No stridor. GI: Non-distended. Skin: Warm and dry.  Intact. Psych: Flat, some improvement. Musc: Generalized edema, some improvement.  No tenderness. Neuro: Alert Motor: 4 --4/5 throughout, unchanged  Assessment/Plan: 1. Functional deficits secondary to debility which require 3+ hours per day of interdisciplinary therapy in a  comprehensive inpatient rehab setting.  Physiatrist is providing close team supervision and 24 hour management of active medical problems listed below.  Physiatrist and rehab team continue to assess barriers to discharge/monitor patient progress toward functional and medical goals  Care Tool:  Bathing    Body parts bathed by patient: Right arm, Left arm, Chest, Abdomen, Right upper leg, Left upper leg, Face   Body parts bathed by helper: Front perineal area, Buttocks, Right lower leg, Left lower leg     Bathing assist Assist Level: Moderate Assistance - Patient 50 - 74%     Upper Body Dressing/Undressing Upper body dressing   What is the patient wearing?: Pull over shirt    Upper body assist Assist Level: Minimal Assistance - Patient > 75%    Lower Body Dressing/Undressing Lower body dressing      What is the patient wearing?: Incontinence brief, Pants     Lower body assist Assist for lower body dressing: Total Assistance - Patient < 25%     Toileting Toileting    Toileting assist Assist for toileting: Total Assistance - Patient < 25%     Transfers Chair/bed transfer  Transfers assist  Chair/bed transfer activity did not occur: Safety/medical concerns(nausea/dizziness/fatigue)  Chair/bed transfer assist level: Moderate Assistance - Patient 50 - 74%     Locomotion Ambulation   Ambulation assist   Ambulation activity did not occur: Safety/medical concerns(nausea/dizziness/fatigue)  Assist level: Moderate Assistance - Patient 50 - 74% Assistive device: Walker-rolling Max distance: 8 ft   Walk 10 feet activity  Assist  Walk 10 feet activity did not occur: Safety/medical concerns        Walk 50 feet activity   Assist Walk 50 feet with 2 turns activity did not occur: Safety/medical concerns         Walk 150 feet activity   Assist Walk 150 feet activity did not occur: Safety/medical concerns         Walk 10 feet on uneven surface   activity   Assist Walk 10 feet on uneven surfaces activity did not occur: Safety/medical concerns         Wheelchair     Assist Will patient use wheelchair at discharge?: No(sternal precautions) Type of Wheelchair: Manual    Wheelchair assist level: Supervision/Verbal cueing Max wheelchair distance: 50 ft    Wheelchair 50 feet with 2 turns activity    Assist        Assist Level: Supervision/Verbal cueing   Wheelchair 150 feet activity     Assist          Blood pressure (!) 114/48, pulse 83, temperature 98 F (36.7 C), temperature source Axillary, resp. rate 18, height 5\' 8"  (1.727 m), weight 89.5 kg, SpO2 93 %.  Medical Problem List and Plan: 1.Debilitysecondary to CAD status post CABG 03/15/2019. Impella device removed 04/10/2019. Sternal precautions  Continue CIR  2. Antithrombotics: -DVT/anticoagulation:SCDs- due to hematuria -antiplatelet therapy: Aspirin 81 mg daily 3. Pain Management:Oxycodone as needed  Complains of leg pain, but not taking as needed medications. 4. Mood:Provide emotional support -antipsychotic agents: N/A 5. Neuropsych: This patientiscapable of making decisions on hisown behalf. 6. Skin/Wound Care:Routine skin checks 7. Fluids/Electrolytes/Nutrition:Monitor I/Os 8. Acute systolic congestive heart failure/cardiogenic shock. Follow-up cardiology services Filed Weights   05/28/19 0640 05/28/19 1300 05/29/19 0511  Weight: 91.8 kg 89.4 kg 89.5 kg   Stable on 4/13 9. Acute hypoxic respiratory failure with Pseudomonas pneumonia. Status post tracheostomy 03/27/2019. Decannulated 04/28/2019.  Weaned supplemental oxygen  10.Acute on chronic anemia/thrombocytopenia. No more heparin for HD for now  Hemoglobin 10.8 on 4/12  Platelets 135 on 4/12  HIT negative 11. Dysphagia/nausea. Dysphagia #2 thins/pure liquids. Follow-up speech therapy.Nasogastric tube/cortrakfor nutritional support.    Dietary follow-up- see dietary note dated 4/6 for additional information.   -PEG not safe option as per cardiothoracic surgery team, will consider follow-up with CTS if necessary.   4/9- Added Phenergan 12.5 mg Q6 hours prn for nausea since zofran doesn't workAdded scopolamine patch for nausea and dizziness.   KUB personally reviewed, bowel gas pattern.  Vascular calcifications-?  SMA, will continue to monitor.  Encourage patient to have son bring in food that patient enjoys and have it modified  P.o. intake remains limited 12. 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 Patient has been transitioned to dialysis and being sent to OP HD after CIR, so is chronicand followed by renal services. Tunneled L IJcatheter placed 04/24/2019, exchanged 13. Hematuria. Cystoscopy completed with evacuation of clot. No further bleeding episodes per notes, intermittent bleeding.   Continue to monitor 14. Left pleural effusion. Status post Pleurx catheter 04/23/2019.PATIENT WILL KEEP PLEURX CATHETER FOR NOW PER CVTS.Currently being drained Mondaysand Thursdays 15. Diffuse drug rashversus contact dermatitis. Appears to be resolving. Steroid taper completed   Benadryl prn, Sarna lotion, hypoallergenic sheets.   Improving per report 16.Rectal bleeding. 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 Follow-up GI with conservative care no further bleeding reported. 17. PAF. Patient remains normal sinus rhythm. Currently off amiodarone. No plan to resume any  anticoagulation at this time until hematuria fully resolved.  Heart rate controlled on 4/13  LOS: 7 days A FACE TO FACE EVALUATION WAS PERFORMED  Jesse Murphy Jesse Murphy 05/29/2019, 10:25 AM

## 2019-05-29 NOTE — Consult Note (Signed)
  Jesse Murphy an 86 year old right-handed male with history of CAD, chronic atrial fibrillation on Xarelto as well as non-Hodgkin's lymphoma, chronic anemia, hypertension, diastolic congestive heart failure, prostate cancer.Per chart review patient lives with spouse in [place]. Independent prior to admission. Wife uses a cane. Local children check on him as needed.Presented to [facility] 03/13/2019 with tachycardia as well as vague associated chest discomfort and palpitations. Initially heart rate 200 bpm was given intravenous Cardizem. Initial labs hemoglobin 11.3, platelets 187,000, sodium 138, potassium 3.9, creatinine 0.96 initial at bedtime troponin 32 with repeat values of 440, 926 and 585. Chest x-ray showed no evidence of consolidation or effusion. EKG showed atrial fibrillation with RVR heart rate 145 with no left bundle branch block. DCCV was attempted in the ED but he remained in atrial fibrillation. Echocardiogram showed ejection fraction of 35% as compared to previous echoes 55 to 60% 12/2017. He was transferred to [facility] for further evaluation. Cardiology service follow-up and patient with cardiac catheterization with LM/LAD and RCA disease. Patient underwent CABG x3 left atrial Maze procedure 03/17/2019 per Dr. Darcey Nora. Impella removed 04/10/2019 patient did require long-term intubation ultimately due to acute hypoxic respiratory failure tracheostomy tube completed 03/27/2019 he was decannulated 04/28/2019 stable nasal cannula. Patient did have left pleural effusion necessitating need for Pleurx catheter 04/23/2019. He developed a rash felt to be possibly drug-induced responded well to prednisone taper. Nephrology consulted 03/29/2019 for AKI with creatinine 4.59from baseline 1.7. Patient ultimately required hemodialysis and currently remains on schedule Monday Wednesday Friday. Gastroenterology services consulted 05/07/2019 for rectal bleeding and felt  bleeding episode likely due to radiation proctitis and continue to monitor. Palliative care has been consulted to establish goals of care. Urology services consulted 04/04/2019 for urinary retention as well as hemorrhagic cystitis underwent cystogram 05/13/2019 a small to moderate amount of clot was evacuated. Ongoing bouts of anemia transfused as needed latest hemoglobin 11, platelets99,000 felt to be induced by heparin during hemodialysis with HIT screen and serotonin release results pending. Patient would have no more heparin for HD until HIT is ruled out.Marland Kitchen He currently on a dysphagia #2 thin liquid dietas well as nasogastric tube for nutritional support. Therapy evaluations completed and patient was admitted for a comprehensive rehab progra.   05/28/2019: 3 PM-4 PM  Jesse Murphy is an 86 year old male with a history of CAD, chronic atrial fibrillation on Xarelto as well as non-Hodgkin's lymphoma, chronic anemia, hypertension, diastolic congestive heart failure, prostate cancer.  The patient presented recently to [facility] on 03/13/2019 with tachycardia as well as vague associated chest discomfort and palpitations.  EKG showed atrial fibrillation and other cardiac issues.  Underwent CABG x3 left atrial Maze procedure 03/17/2019.  The patient has had extended hospital course and now has been admitted to the comprehensive rehabilitation program due to debility.  The patient was referred for neuropsychological consultation due to coping and adjustment with significant cardiovascular disease and issues and significant debility.  The patient acknowledged depression and frustration about everything is gone on and difficulty coping with dialysis, his significant weakness and inability to care for himself.  We worked on Radiographer, therapeutic and adjustment around his extended hospital stay and better coping and managing issues related to his significant and profound medical complications status.

## 2019-05-30 ENCOUNTER — Inpatient Hospital Stay (HOSPITAL_COMMUNITY): Payer: Medicare Other

## 2019-05-30 ENCOUNTER — Inpatient Hospital Stay (HOSPITAL_COMMUNITY): Payer: Medicare Other | Admitting: Occupational Therapy

## 2019-05-30 ENCOUNTER — Inpatient Hospital Stay (HOSPITAL_COMMUNITY): Payer: Medicare Other | Admitting: Speech Pathology

## 2019-05-30 LAB — GLUCOSE, CAPILLARY
Glucose-Capillary: 90 mg/dL (ref 70–99)
Glucose-Capillary: 84 mg/dL (ref 70–99)
Glucose-Capillary: 89 mg/dL (ref 70–99)
Glucose-Capillary: 96 mg/dL (ref 70–99)

## 2019-05-30 MED ORDER — MIRTAZAPINE 15 MG PO TBDP
7.5000 mg | ORAL_TABLET | Freq: Every day | ORAL | Status: DC
  Administered 2019-05-30 – 2019-06-03 (×5): 7.5 mg via ORAL
  Filled 2019-05-30 (×6): qty 0.5

## 2019-05-30 MED ORDER — BOOST / RESOURCE BREEZE PO LIQD CUSTOM
1.0000 | Freq: Three times a day (TID) | ORAL | Status: DC
  Administered 2019-05-30: 1 via ORAL

## 2019-05-30 MED ORDER — METOCLOPRAMIDE HCL 5 MG PO TABS
5.0000 mg | ORAL_TABLET | Freq: Three times a day (TID) | ORAL | Status: DC
  Administered 2019-05-30 – 2019-06-14 (×57): 5 mg via ORAL
  Filled 2019-05-30 (×56): qty 1

## 2019-05-30 NOTE — Progress Notes (Addendum)
Speech Language Pathology Daily Session Note  Patient Details  Name: Jesse Murphy MRN: [id number] Date of Birth: [birth year]  Today's Date: 05/30/2019 SLP Individual Time: 0930-1015 SLP Individual Time Calculation (min): 45 min  Short Term Goals: Week 1: SLP Short Term Goal 1 (Week 1): Pt will consume current diet with minimal s/sx aspiration and efficient mastication and oral clearance with Min A verbal/visual cues for use of compensatory strategies SLP Short Term Goal 2 (Week 1): Pt will sustain attention to functional tasks with Min A verbal/visual cues for reidrection SLP Short Term Goal 3 (Week 1): Pt will use external aids to orient to date with Min A verbal/visual cues SLP Short Term Goal 4 (Week 1): Pt will demonstrate ability to problem solve basic familiar tasks with Min A verbal/visual cues  Skilled Therapeutic Interventions:  Skilled treatment session targeted pt's dysphagia and cognition goals. SLP facilitated session by providing skilled observation of pt consuming graham crackers and thin liquids via straw. Pt required Min A cues to consistently utilize chin tuck. Prior to PO intake, pt had intermittent cough that was also present during consumption. Cough didn't appear related to intake. Pt consumed graham crackers with no overt s/s of dysphagia or aspiration.  SLP further facilitated session by providing Min A cues for using memory aid to recall date and Mod A cues at 3 to 4 minute intervals to sustain attention to task.        Pain Pain Assessment Pain Scale: 0-10 Pain Score: 0-No pain  Therapy/Group: Individual Therapy  Gracyn Santillanes 05/30/2019, 11:01 AM

## 2019-05-30 NOTE — Progress Notes (Signed)
Occupational Therapy Weekly Progress Note  Patient Details  Name: Jesse Murphy MRN: [id number] Date of Birth: 1928/03/10  Beginning of progress report period: May 23, 2019 End of progress report period: May 30, 2019     Patient has met 0 of 4 short term goals.  He is making progress towards them but his participation was limited early in the week and overall his progress is very slow moving.   Patient continues to demonstrate the following deficits: muscle weakness and muscle joint tightness, decreased cardiorespiratoy endurance and decreased sitting balance, decreased standing balance, decreased postural control and decreased balance strategies and therefore will continue to benefit from skilled OT intervention to enhance overall performance with BADL.  Patient progressing toward long term goals..  Continue plan of care.  OT Short Term Goals Week 1:  OT Short Term Goal 1 (Week 1): Pt will be able to rise to stand with min - mod A to prep for clothing management with toileting. OT Short Term Goal 1 - Progress (Week 1): Progressing toward goal OT Short Term Goal 2 (Week 1): Pt will be able to squat pivot to toilet or BSC with mod- max A. OT Short Term Goal 2 - Progress (Week 1): Progressing toward goal OT Short Term Goal 3 (Week 1): Pt will don shirt with set up. OT Short Term Goal 3 - Progress (Week 1): Progressing toward goal OT Short Term Goal 4 (Week 1): Pt will don pants with mod-max A. OT Short Term Goal 4 - Progress (Week 1): Progressing toward goal Week 2:  OT Short Term Goal 1 (Week 2): Pt will rise to stand with mod A of 1 to RW to prepare for LB self care. OT Short Term Goal 2 (Week 2): Pt will complete a stand pivot to [facility] with RW with min A. OT Short Term Goal 3 (Week 2): Pt will be able to don a shirt with set up. OT Short Term Goal 4 (Week 2): Pt will demonstrate improved activity tolerance to stay up in his wc for at least 1 hour at a time.       Therapy  Documentation Precautions:  Precautions Precautions: Fall Precaution Booklet Issued: Yes (comment) Precaution Comments: monitor for orthostatic hypotension Restrictions Weight Bearing Restrictions: No RUE Weight Bearing: Weight bearing as tolerated RUE Partial Weight Bearing Percentage or Pounds: No restrictions Other Position/Activity Restrictions: no longer has sternal precautions or UE PWB precautions - confirmed with Dr. Dagoberto Ligas on 4/9 ADL: ADL Upper Body Bathing: Setup Where Assessed-Upper Body Bathing: Edge of bed Lower Body Bathing: Maximal assistance Where Assessed-Lower Body Bathing: Edge of bed Upper Body Dressing: Moderate assistance Where Assessed-Upper Body Dressing: Edge of bed Lower Body Dressing: Dependent Where Assessed-Lower Body Dressing: Edge of bed Toileting: Dependent Where Assessed-Toileting: Bedside Commode Toilet Transfer: Moderate assistance Toilet Transfer Method: Stand pivot Toilet Transfer Equipment: Bedside commode   [place] 05/30/2019, 12:41 PM

## 2019-05-30 NOTE — Progress Notes (Addendum)
[facility] PHYSICAL MEDICINE & REHABILITATION PROGRESS NOTE  Subjective/Complaints: Patient seen laying in bed this morning.  He states he slept well overnight.  He states that he continues to have difficulty with oral intake.  Some of it appears to be secondary to desire, it appears to be secondary to nausea.  He states his son has not brought in food that he enjoys, but is planning for today.  He was able to eat more yesterday than he has previously.  ROS: + Nausea.  Denies CP, SOB, V/D  Objective:   DG Abd 1 View  Result Date: 05/28/2019 CLINICAL DATA:  Failure to thrive EXAM: ABDOMEN - 1 VIEW COMPARISON:  05/24/2019, 03/30/2019 FINDINGS: Lung bases are non included. No feeding tube visible on the current image. Nonobstructed bowel-gas pattern. Probable vascular calcifications in the left upper quadrant. IMPRESSION: Nonobstructed bowel-gas pattern. No feeding tube is visible on the current image. Electronically Signed   By: Donavan Foil M.D.   On: 05/28/2019 19:50   Recent Labs    05/28/19 1320  WBC 10.8*  HGB 9.3*  HCT 30.0*  PLT 135*   Recent Labs    05/28/19 1320  NA 122*  K 3.9  CL 86*  CO2 26  GLUCOSE 122*  BUN 78*  CREATININE 5.37*  CALCIUM 7.8*    Intake/Output Summary (Last 24 hours) at 05/30/2019 0905 Last data filed at 05/30/2019 0846 Gross per 24 hour  Intake 320 ml  Output 120 ml  Net 200 ml     Physical Exam: Vital Signs Blood pressure (!) 137/59, pulse 81, temperature 98.4 F (36.9 C), temperature source Oral, resp. rate 20, height 5\' 8"  (1.727 m), weight 90 kg, SpO2 95 %. Constitutional: No distress . Vital signs reviewed. HENT: Normocephalic.  Atraumatic.  + Stoma. Eyes: EOMI. No discharge. Cardiovascular: No JVD. Respiratory: Normal effort.  No stridor. GI: Non-distended. Skin: Dry. Assessed with nursing: no lesions behind ears b/l Left elbow small shear injury Small right heal DTI - instructed Prevalon boots with nursing Left buttock -  closed lesion Right buttock- small stage 2 with mild drainage Psych: Flat. Musc: Generalized edema, improving Neuro: Alert Motor: 4 --4/5 throughout, stable  Assessment/Plan: 1. Functional deficits secondary to debility which require 3+ hours per day of interdisciplinary therapy in a comprehensive inpatient rehab setting.  Physiatrist is providing close team supervision and 24 hour management of active medical problems listed below.  Physiatrist and rehab team continue to assess barriers to discharge/monitor patient progress toward functional and medical goals  Care Tool:  Bathing    Body parts bathed by patient: Right arm, Left arm, Chest, Abdomen, Right upper leg, Left upper leg, Face   Body parts bathed by helper: Front perineal area, Buttocks, Right lower leg, Left lower leg     Bathing assist Assist Level: Moderate Assistance - Patient 50 - 74%     Upper Body Dressing/Undressing Upper body dressing   What is the patient wearing?: Pull over shirt    Upper body assist Assist Level: Minimal Assistance - Patient > 75%    Lower Body Dressing/Undressing Lower body dressing      What is the patient wearing?: Incontinence brief, Pants     Lower body assist Assist for lower body dressing: Total Assistance - Patient < 25%     Toileting Toileting    Toileting assist Assist for toileting: Total Assistance - Patient < 25%     Transfers Chair/bed transfer  Transfers assist  Chair/bed transfer activity did  not occur: Safety/medical concerns(nausea/dizziness/fatigue)  Chair/bed transfer assist level: Maximal Assistance - Patient 25 - 49%     Locomotion Ambulation   Ambulation assist   Ambulation activity did not occur: Safety/medical concerns(nausea/dizziness/fatigue)  Assist level: Moderate Assistance - Patient 50 - 74% Assistive device: Walker-rolling Max distance: 8 ft   Walk 10 feet activity   Assist  Walk 10 feet activity did not occur:  Safety/medical concerns        Walk 50 feet activity   Assist Walk 50 feet with 2 turns activity did not occur: Safety/medical concerns         Walk 150 feet activity   Assist Walk 150 feet activity did not occur: Safety/medical concerns         Walk 10 feet on uneven surface  activity   Assist Walk 10 feet on uneven surfaces activity did not occur: Safety/medical concerns         Wheelchair     Assist Will patient use wheelchair at discharge?: No(sternal precautions) Type of Wheelchair: Manual    Wheelchair assist level: Supervision/Verbal cueing Max wheelchair distance: 50 ft    Wheelchair 50 feet with 2 turns activity    Assist        Assist Level: Supervision/Verbal cueing   Wheelchair 150 feet activity     Assist          Blood pressure (!) 137/59, pulse 81, temperature 98.4 F (36.9 C), temperature source Oral, resp. rate 20, height 5\' 8"  (1.727 m), weight 90 kg, SpO2 95 %.  Medical Problem List and Plan: 1.Debilitysecondary to CAD status post CABG 03/15/2019. Impella device removed 04/10/2019. Sternal precautions  Continue CIR   Nutrition remains significant barrier for discharge  Team conference today to discuss current and goals and coordination of care, home and environmental barriers, and discharge planning with nursing, case manager, and therapies.  2. Antithrombotics: -DVT/anticoagulation:SCDs- due to hematuria -antiplatelet therapy: Aspirin 81 mg daily 3. Pain Management:Oxycodone as needed  Complains of leg pain, but not taking as needed medications.   Controlled on 4/14 4. Mood:Provide emotional support   Remeron started on 4/14 -antipsychotic agents: N/A 5. Neuropsych: This patientiscapable of making decisions on hisown behalf. 6. Skin/Wound Care:Routine skin checks 7. Fluids/Electrolytes/Nutrition:Monitor I/Os 8. Acute systolic congestive heart failure/cardiogenic shock.  Follow-up cardiology services Filed Weights   05/28/19 1300 05/29/19 0511 05/30/19 0543  Weight: 89.4 kg 89.5 kg 90 kg   Relatively stable on 4/14 9. Acute hypoxic respiratory failure with Pseudomonas pneumonia. Status post tracheostomy 03/27/2019. Decannulated 04/28/2019.  Weaned supplemental oxygen  10.Acute on chronic anemia/thrombocytopenia. No more heparin for HD for now  Hemoglobin 10.8 on 4/12  Platelets 135 on 4/12  HIT negative 11. Dysphagia/nausea. Dysphagia #2 thins/pure liquids. Follow-up speech therapy.Nasogastric tube/cortrakfor nutritional support.   Dietary follow-up- see dietary note dated 4/6 for additional information.   -PEG not safe option as per cardiothoracic surgery team, will consider follow-up with CTS if necessary.   4/9- Added Phenergan 12.5 mg Q6 hours prn for nausea since zofran doesn't workAdded scopolamine patch for nausea and dizziness.   KUB personally reviewed, bowel gas pattern.  Vascular calcifications-?  SMA, will continue to monitor.  Encourage patient to have son bring in food that patient enjoys and have it modified, reminded  P.o. intake remains limited, but slight improvement   ECG reviewed, borderline QTC-trial low-dose Reglan with meals on 4/14  Remeron started on 4/14  CMP ordered 12. 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 Patient has been  transitioned to dialysis and being sent to OP HD after CIR, so is chronicand followed by renal services. Tunneled L IJcatheter placed 04/24/2019, exchanged 13. Hematuria. Cystoscopy completed with evacuation of clot. No further bleeding episodes per notes, intermittent bleeding.   Continue to monitor 14. Left pleural effusion. Status post Pleurx catheter 04/23/2019.PATIENT WILL KEEP PLEURX CATHETER FOR NOW PER CVTS.Currently being drained Mondaysand Thursdays 15. Diffuse drug rashversus contact dermatitis. Appears to be resolving. Steroid taper completed   Benadryl prn, Sarna  lotion, hypoallergenic sheets.   Improving 16.Rectal bleeding. 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 Follow-up GI with conservative care no further bleeding reported. 17. PAF. Patient remains normal sinus rhythm. Currently off amiodarone. No plan to resume any anticoagulation at this time until hematuria fully resolved.  Heart rate controlled on 4/14  LOS: 8 days A FACE TO FACE EVALUATION WAS PERFORMED  Cadee Agro Lorie Phenix 05/30/2019, 9:05 AM

## 2019-05-30 NOTE — Progress Notes (Signed)
Provider and RN assessed patient's skin. Flowsheet was updated.

## 2019-05-30 NOTE — Progress Notes (Signed)
Calorie Count Note  **RD working remotely**  48 hour calorie count ordered.  Diet: Dysphagia 2, Thin liquids Supplements: Nepro shake TID, 47ml Pro-stat TID  Day 1 results: Breakfast: 0 kcals, 0 grams protein Lunch: 0 kcals, 0 grams protein Dinner: 89 kcals, 6 grams protein Supplements: refused all supplements  Total intake: 89 kcal (4% of minimum estimated needs)  6 protein (4% of minimum estimated needs)  Nutrition Dx: Increased nutrient needs related to wound healing as evidenced by estimated needs.  Goal: Patient will meet greater than or equal to 90% of their needs  Intervention:  -d/c Nepro due to patient refusing -d/c Pro-stat due to patient refusing -order Boost Breeze po TID, each supplement provides 250 kcal and 9 grams of protein   Larkin Ina, MS, RD, LDN RD pager number and weekend/on-call pager number located in [place].

## 2019-05-30 NOTE — Progress Notes (Signed)
Patient ID: CLEO VILLAMIZAR, male   DOB: 06-06-1928, 86 y.o.   MRN: [id number]  [facility] Progress Note   Assessment/ Plan:   1.  End-stage renal disease following persistent renal replacement dependent acute kidney Injury: With hemodynamically mediated with ATN following postoperative congestive heart failure/cardiogenic shock.  On renal replacement therapy since 2/11 and appears to be tolerating intermittent hemodialysis without problems following transition from intermittent hemodialysis.  He is barely oliguric and without any evidence of renal recovery for which we will continue MWF dialysis. 2.  Acute systolic heart failure/cardiogenic shock postoperatively: Transiently required Impella support.  Hemodynamically stable at this time and ongoing intensive inpatient rehabilitation. 3.  Coronary artery disease status post three-vessel CABG 4.  Anemia: With acute/critical illness and previously significant hematuria. 5.  Nutrition: Low albumin likely negative acute phase reactant, monitor with ongoing diet/ONS. 6.  Secondary hyperparathyroidism: Calcium and phosphorus levels currently at goal on calcium acetate, monitor with HD. 7.  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  Subjective:   Without acute events overnight, CIR team attempting to optimize nutritional intake.  Objective:   BP (!) 137/59 (BP Location: Right Arm)   Pulse 81   Temp 98.4 F (36.9 C) (Oral)   Resp 20   Ht 5\' 8"  (1.727 m)   Wt 90 kg   SpO2 95%   BMI 30.17 kg/m   Intake/Output Summary (Last 24 hours) at 05/30/2019 1026 Last data filed at 05/30/2019 0846 Gross per 24 hour  Intake 320 ml  Output 120 ml  Net 200 ml   Weight change: 0.6 kg  Physical Exam: Gen: Sleeping comfortably in bed, easy to awaken and engage in conversation. CVS: Pulse regular rhythm, normal rate, S1 and S2 normal Resp: Anteriorly clear to auscultation, no rales.  Left  IJ TDC Abd: Soft, obese, nontender Ext: Trace dependent edema.  No lower extremity edema.  Imaging: DG Abd 1 View  Result Date: 05/28/2019 CLINICAL DATA:  Failure to thrive EXAM: ABDOMEN - 1 VIEW COMPARISON:  05/24/2019, 03/30/2019 FINDINGS: Lung bases are non included. No feeding tube visible on the current image. Nonobstructed bowel-gas pattern. Probable vascular calcifications in the left upper quadrant. IMPRESSION: Nonobstructed bowel-gas pattern. No feeding tube is visible on the current image. Electronically Signed   By: Donavan Foil M.D.   On: 05/28/2019 19:50    Labs: BMET Recent Labs  Lab 05/23/19 1455 05/25/19 0514 05/28/19 1320  NA 133* 126* 122*  K 4.2 3.8 3.9  CL 96* 90* 86*  CO2 23 25 26   GLUCOSE 168* 159* 122*  BUN 147* 96* 78*  CREATININE 6.92* 5.54* 5.37*  CALCIUM 7.8* 7.6* 7.8*  PHOS 5.3*  --  2.4*   CBC Recent Labs  Lab 05/23/19 1455 05/28/19 1320  WBC 8.9 10.8*  HGB 10.5* 9.3*  HCT 33.4* 30.0*  MCV 92.3 91.7  PLT 110* 135*    Medications:    . aspirin  81 mg Oral Daily  . calcium acetate (Phos Binder)  667 mg Oral TID WC  . Chlorhexidine Gluconate Cloth  6 each Topical Q0600  . famotidine  10 mg Oral Daily  . feeding supplement (NEPRO CARB STEADY)  237 mL Oral TID BM  . feeding supplement (PRO-STAT SUGAR FREE 64)  30 mL Oral TID  . Gerhardt's butt cream   Topical BID  . hydrocortisone   Rectal BID  . hydrocortisone  25 mg Rectal BID  . hydrocortisone cream  Topical BID  . insulin aspart  0-15 Units Subcutaneous TID WC  . metoCLOPramide  5 mg Oral TID AC & HS  . mirtazapine  7.5 mg Oral QHS  . multivitamin  1 tablet Oral QHS  . ondansetron  4 mg Oral Once  . scopolamine  1 patch Transdermal Q72H  . simethicone  80 mg Oral TID   Elmarie Shiley, MD 05/30/2019, 10:26 AM

## 2019-05-30 NOTE — Patient Care Conference (Signed)
Inpatient RehabilitationTeam Conference and Plan of Care Update Date: 05/30/2019   Time: 11:00 AM    Patient Name: Jesse Murphy      Medical Record Number: [id number]  Date of Birth: 1928-08-06 Sex: Male         Room/Bed: 4W25C/4W25C-01 Payor Info: Payor: MEDICARE / Plan: MEDICARE PART A AND B / Product Type: *No Product type* /    Admit Date/Time:  05/22/2019  3:48 PM  Primary Diagnosis:  Debility  Patient Active Problem List   Diagnosis Date Noted  . Failure to thrive in adult   . Allergic contact dermatitis   . ESRD on dialysis ([place])   . Dysphagia   . Thrombocytopenia ([place])   . Acute on chronic anemia   . Supplemental oxygen dependent   . Acute systolic congestive heart failure ([place])   . Pressure ulcer 05/23/2019  . Debility 05/22/2019  . AKI (acute kidney injury) ([place])   . Palliative care encounter   . Pressure injury of skin 03/25/2019  . Acute respiratory failure with hypoxia ([place])   . S/P CABG x 3 03/16/2019  . Non-ST elevation (NSTEMI) myocardial infarction ([place])   . Acute on chronic combined systolic and diastolic CHF (congestive heart failure) ([place])   . Coronary artery disease due to lipid rich plaque   . Atrial fibrillation with RVR ([place]) 03/14/2019  . Atrial fibrillation with rapid ventricular response ([place]) 03/13/2019  . Chest pain 09/12/2018  . Chronic diastolic heart failure ([place]) 05/29/2018  . Hypertension 05/29/2018  . Non Hodgkin's lymphoma ([place]) 09/30/2017  . Heme positive stool 08/11/2017  . Chronic anticoagulation 07/05/2016  . CAD S/P percutaneous coronary angioplasty 07/05/2016  . Type 2 diabetes mellitus without complications ([place]) 95/28/4132  . PAF (paroxysmal atrial fibrillation) ([place]) 06/14/2016  . Chest pain in adult 06/14/2016  . Dyspnea on exertion 06/14/2016  . Allergic rhinitis 06/14/2016  . GERD (gastroesophageal reflux disease) 06/14/2016  . BPH (benign prostatic hyperplasia) 06/14/2016    Expected Discharge Date: Expected Discharge  Date: 06/14/19  Team Members Present: Physician leading conference: Dr. Delice Lesch Care Coodinator Present: Nestor Lewandowsky, RN, BSN, CRRN;Christina Sampson Goon, BSW;Genie Stefany Starace, RN, MSN Nurse Present: Vernie Murders, RN PT Present: Drema Dallas Shagen, PT;Carr Zenia Resides, PT OT Present: Meriel Pica, OT SLP Present: Stormy Fabian, SLP PPS Coordinator present : Gunnar Fusi, Novella Olive, PT     Current Status/Progress Goal Weekly Team Focus  Bowel/Bladder   Continent and incon. of B/B  To be continent completely  Assess if pt needs to use bathroom every 2 hours.   Swallow/Nutrition/ Hydration   Dys. 2 textures with thin liquids via straw to facilitate chin tuck, Supervision-Min A  Mod I  increased PO intake, tolerance of current diet, trials of upgraded textures   ADL's   min A UB dressing, total A LB dressing and toileting, mod A transfer to [facility] with RW, improving activity tolerance  min A with self care, S with toilet or BSC transfer  ADL training, functional mobility, activity tolerance   Mobility   limited progress this week due to fatigue/nausea limiting progresison of OOB mobility; level of assist fluctuates with fatigue; min/mod assist supine<>sit, mod to heavy max A sit<>stand using RW, max assist bed<>chair transfers, gait up to 62ft using RW with mod assist  supervision/CGA overall  activity tolerance, B LE strengthening, bed mobility training, transfer training, gait training, pt education   Communication             Safety/Cognition/ Behavioral Observations  Min A  Supervision  complex problem solving, attention   Pain   Has complaints of pain in left big toe and bilateral legs at times; 7/10  To decrease pain level to 3/10  Assess pain q shift and as needed   Skin   Stage two on bilateral earlobes, deep tissue on right heel, stage 2 on right buttocks and left elbow.  To prevent any further breakdown and help the healing process continue.  Assess skin/dressings q shift and  prn, q2 turns if needed, and use barrier creams    Rehab Goals Patient on target to meet rehab goals: Yes *See Care Plan and progress notes for long and short-term goals.     Barriers to Discharge  Current Status/Progress Possible Resolutions Date Resolved   Nursing                  PT                    OT                  SLP                SW Home environment access/layout;Decreased caregiver support 1 level 4 step entry to home            Discharge Planning/Teaching Needs:  Home with wife and son; daughter to assist after work  Transfers, toileting, medications, diet, etc   Team Discussion: MD anemia stable, HF stable, nutrition issues, reglan started for nausea, remeron added, new HD patient.  RN nausea, swelling arms/hands/legs/feet, HD today, rash better, BM today, CBG AC/HS.  OT min a goals, max/total now, B/D/transfers yesterday heavy max A, fatigue today, slow progress.  PT needs to be pushed.  SLP MBS tomorrow, thick secretions, coughing, suctioning with [place].   Revisions to Treatment Plan: N/A     Medical Summary Current Status: Debility secondary to CAD status post CABG 03/15/2019. Weekly Focus/Goal: Improve mobility, nutrition, nausea  Barriers to Discharge: Medical stability;Hemodialysis;Other (comments);Nutrition means;Wound care  Barriers to Discharge Comments: Failure to thrive, new HD Possible Resolutions to Barriers: Therapies, appetite stimulant initiated, optimzie meds for nausea, follow labs, follow weights, Nephro recs   Continued Need for Acute Rehabilitation Level of Care: The patient requires daily medical management by a physician with specialized training in physical medicine and rehabilitation for the following reasons: Direction of a multidisciplinary physical rehabilitation program to maximize functional independence : Yes Medical management of patient stability for increased activity during participation in an intensive rehabilitation  regime.: Yes Analysis of laboratory values and/or radiology reports with any subsequent need for medication adjustment and/or medical intervention. : Yes   I attest that I was present, lead the team conference, and concur with the assessment and plan of the team.   Jodell Cipro M 05/30/2019, 8:02 PM   Team conference was held via web/ teleconference due to [place] - 19

## 2019-05-30 NOTE — Progress Notes (Signed)
Occupational Therapy Session Note  Patient Details  Name: Jesse Murphy MRN: [id number] Date of Birth: October 05, 1928  Today's Date: 05/30/2019 OT Individual Time: 1194-1740 OT Individual Time Calculation (min): 40 min    Short Term Goals: Week 1:  OT Short Term Goal 1 (Week 1): Pt will be able to rise to stand with min - mod A to prep for clothing management with toileting. OT Short Term Goal 2 (Week 1): Pt will be able to squat pivot to toilet or BSC with mod- max A. OT Short Term Goal 3 (Week 1): Pt will don shirt with set up. OT Short Term Goal 4 (Week 1): Pt will don pants with mod-max A.  Skilled Therapeutic Interventions/Progress Updates:    Pt seen this session for functional mobility and BADL training. At start of session, pt c/o nausea but it resolved by end of session.  His BUE edema is much worse today impacting his arm and hand ROM.   TED hose donned on pt prior to getting out of bed.  He needs a great deal of encouragement as his first response is "I can't" prior to trying.   Pt did sit to EOB with mod A (great difficulty moving his legs) and sat at EOB with S.  Increased A needed with UB b/d due to edema.  MAX encouragement to have pt try to don sweat pants over feet with Max A.   Max A sit to stand to RW and then therapist pulled pants over hips for pt.  Requested to pt to have his sit in recliner or wc but he adamantly refused, stating he was too tired to do that.    2nd sit to stand to RW and pt took 4 steps to his side to step closer to the pillows.  Min A to move back to supine.  Pt adjusted in bed with pillows under arms to elevate arms.   Pt resting in bed with all needs met.  Therapy Documentation Precautions:  Precautions Precautions: Fall Precaution Booklet Issued: Yes (comment) Precaution Comments: monitor for orthostatic hypotension Restrictions Weight Bearing Restrictions: No RUE Weight Bearing: Weight bearing as tolerated RUE Partial Weight Bearing  Percentage or Pounds: No restrictions Other Position/Activity Restrictions: no longer has sternal precautions or UE PWB precautions - confirmed with Dr. Dagoberto Ligas on 4/9    Vital Signs: Therapy Vitals Temp: 98.4 F (36.9 C) Temp Source: Oral Pulse Rate: 81 Resp: 20 BP: (!) 137/59 Patient Position (if appropriate): Lying Oxygen Therapy SpO2: 95 % O2 Device: Room Air Pain: Pain Assessment Pain Scale: 0-10 Pain Score: 0-No pain ADL: ADL Upper Body Bathing: Setup Where Assessed-Upper Body Bathing: Edge of bed Lower Body Bathing: Maximal assistance Where Assessed-Lower Body Bathing: Edge of bed Upper Body Dressing: Minimal assistance Where Assessed-Upper Body Dressing: Edge of bed Lower Body Dressing: Dependent Where Assessed-Lower Body Dressing: Edge of bed Toileting: Dependent Where Assessed-Toileting: Bedside Commode Toilet Transfer: Moderate assistance Toilet Transfer Method: Stand pivot Toilet Transfer Equipment: Bedside commode   Therapy/Group: Individual Therapy  [place] 05/30/2019, 9:23 AM

## 2019-05-30 NOTE — Progress Notes (Signed)
Physical Therapy Weekly Progress Note  Patient Details  Name: Jesse Murphy MRN: [id number] Date of Birth: 27-Nov-1928  Beginning of progress report period: May 23, 2019 End of progress report period: May 30, 2019   Patient has met 0 of 4 short term goals. Pt is making slow progress with functional mobility and continues to be limited by fatigue, poor endurance, edema and weakness. Pt continues to require mod assist for bed mobility, sit<>stands and transfers. Due to weakness, poor endurance and fatigue he has been unable to participate in much gait training, has only ambulated about 5-8 ft with RW and mod assist. He has not been able to tolerate sitting OOB for periods of time due to fatigue/weakness as well.   Patient continues to demonstrate the following deficits muscle weakness, decreased cardiorespiratoy endurance and decreased oxygen support, decreased coordination, decreased initiation and decreased memory and decreased standing balance and decreased balance strategies and therefore will continue to benefit from skilled PT intervention to increase functional independence with mobility.  Patient progressing toward long term goals..  Continue plan of care. Pt's therapy frequency has been adjusted to 15 hours of therapy over 7 days due to limited activity tolerance.   PT Short Term Goals Week 1:  PT Short Term Goal 1 (Week 1): Pt will perform bed<>chair transfer min assist with LRAD PT Short Term Goal 1 - Progress (Week 1): Progressing toward goal PT Short Term Goal 2 (Week 1): Pt will ambulate x 10 ft with LRAD and min assist PT Short Term Goal 2 - Progress (Week 1): Progressing toward goal PT Short Term Goal 3 (Week 1): Pt will tolerate sitting up in w/c or recliner OOB >1 hr PT Short Term Goal 3 - Progress (Week 1): Not met PT Short Term Goal 4 (Week 1): Pt will perform sit<>stand with LRAD and min assist, maintaining sternal precuations PT Short Term Goal 4 - Progress (Week 1):  Progressing toward goal Week 2:  PT Short Term Goal 1 (Week 2): Pt will transfer bed<>chair min assist and LRAD PT Short Term Goal 2 (Week 2): Pt will perform sit<>stand with min assist and RW PT Short Term Goal 3 (Week 2): Pt will ambulate x 20 ft with mod assist and LRAD  Skilled Therapeutic Interventions/Progress Updates:  Ambulation/gait training;Community reintegration;DME/adaptive equipment instruction;Neuromuscular re-education;Psychosocial support;Stair training;UE/LE Strength taining/ROM;Balance/vestibular training;Discharge planning;Functional electrical stimulation;Pain management;Skin care/wound management;Therapeutic Activities;UE/LE Coordination activities;Cognitive remediation/compensation;Disease management/prevention;Functional mobility training;Patient/family education;Therapeutic Exercise   Therapy Documentation Precautions:  Precautions Precautions: Fall Precaution Booklet Issued: Yes (comment) Precaution Comments: monitor for orthostatic hypotension Restrictions Weight Bearing Restrictions: No RUE Weight Bearing: Weight bearing as tolerated RUE Partial Weight Bearing Percentage or Pounds: No restrictions Other Position/Activity Restrictions: no longer has sternal precautions or UE PWB precautions - confirmed with Dr. Dagoberto Ligas on 4/9   Therapy/Group: Individual Therapy  Netta Corrigan, PT, DPT, CSRS 05/30/2019, 7:56 AM

## 2019-05-30 NOTE — Progress Notes (Signed)
PT Cancellation Note  Patient Details Name: Jesse Murphy MRN: [id number] DOB: September 21, 1928   Cancelled Treatment:      Pt missed 45 minutes of skilled therapy tx this session secondary to off unit for hemodialysis.   [name] 05/30/2019, 4:15 PM

## 2019-05-30 NOTE — Plan of Care (Signed)
  Problem: Consults Goal: RH GENERAL PATIENT EDUCATION Description: See Patient Education module for education specifics. Outcome: Progressing Goal: Skin Care Protocol Initiated - if Braden Score 18 or less Description: If consults are not indicated, leave blank or document N/A Outcome: Progressing Goal: Nutrition Consult-if indicated Outcome: Progressing Goal: Diabetes Guidelines if Diabetic/Glucose > 140 Description: If diabetic or lab glucose is > 140 mg/dl - Initiate Diabetes/Hyperglycemia Guidelines & Document Interventions  Outcome: Progressing   Problem: RH BLADDER ELIMINATION Goal: RH STG MANAGE BLADDER WITH ASSISTANCE Description: STG Manage Bladder With min/mod Assistance Outcome: Progressing   Problem: RH SKIN INTEGRITY Goal: RH STG SKIN FREE OF INFECTION/BREAKDOWN Description: Patients skin will remain free from FURTHER breakdown or infection with min/mod assist. Outcome: Progressing Goal: RH STG MAINTAIN SKIN INTEGRITY WITH ASSISTANCE Description: STG Maintain Skin Integrity With min/mod Assistance. Outcome: Progressing Goal: RH STG ABLE TO PERFORM INCISION/WOUND CARE W/ASSISTANCE Description: STG Able To Perform Incision/Wound Care With total assistance from caregiver. Outcome: Progressing   Problem: RH SAFETY Goal: RH STG ADHERE TO SAFETY PRECAUTIONS W/ASSISTANCE/DEVICE Description: STG Adhere to Safety Precautions With min Assistance/Device. Outcome: Progressing   Problem: RH PAIN MANAGEMENT Goal: RH STG PAIN MANAGED AT OR BELOW PT'S PAIN GOAL Description: < 4 Outcome: Progressing

## 2019-05-31 ENCOUNTER — Inpatient Hospital Stay (HOSPITAL_COMMUNITY): Payer: Medicare Other | Admitting: Occupational Therapy

## 2019-05-31 ENCOUNTER — Encounter (HOSPITAL_COMMUNITY): Payer: Medicare Other | Admitting: Speech Pathology

## 2019-05-31 ENCOUNTER — Inpatient Hospital Stay (HOSPITAL_COMMUNITY): Payer: Medicare Other | Admitting: Physical Therapy

## 2019-05-31 ENCOUNTER — Other Ambulatory Visit (HOSPITAL_COMMUNITY): Payer: Medicare Other

## 2019-05-31 LAB — GLUCOSE, CAPILLARY
Glucose-Capillary: 75 mg/dL (ref 70–99)
Glucose-Capillary: 129 mg/dL — ABNORMAL HIGH (ref 70–99)
Glucose-Capillary: 109 mg/dL — ABNORMAL HIGH (ref 70–99)
Glucose-Capillary: 103 mg/dL — ABNORMAL HIGH (ref 70–99)

## 2019-05-31 NOTE — Plan of Care (Signed)
  Problem: Consults Goal: RH GENERAL PATIENT EDUCATION Description: See Patient Education module for education specifics. Outcome: Progressing Goal: Skin Care Protocol Initiated - if Braden Score 18 or less Description: If consults are not indicated, leave blank or document N/A Outcome: Progressing Goal: Nutrition Consult-if indicated Outcome: Progressing Goal: Diabetes Guidelines if Diabetic/Glucose > 140 Description: If diabetic or lab glucose is > 140 mg/dl - Initiate Diabetes/Hyperglycemia Guidelines & Document Interventions  Outcome: Progressing   Problem: RH BOWEL ELIMINATION Goal: RH STG MANAGE BOWEL WITH ASSISTANCE Description: STG Manage Bowel with min Assistance. Outcome: Progressing Goal: RH STG MANAGE BOWEL W/MEDICATION W/ASSISTANCE Description: STG Manage Bowel with Medication with min Assistance. Outcome: Progressing   Problem: RH BLADDER ELIMINATION Goal: RH STG MANAGE BLADDER WITH ASSISTANCE Description: STG Manage Bladder With min/mod Assistance Outcome: Progressing   Problem: RH SKIN INTEGRITY Goal: RH STG SKIN FREE OF INFECTION/BREAKDOWN Description: Patients skin will remain free from FURTHER breakdown or infection with min/mod assist. Outcome: Progressing Goal: RH STG MAINTAIN SKIN INTEGRITY WITH ASSISTANCE Description: STG Maintain Skin Integrity With min/mod Assistance. Outcome: Progressing Goal: RH STG ABLE TO PERFORM INCISION/WOUND CARE W/ASSISTANCE Description: STG Able To Perform Incision/Wound Care With total assistance from caregiver. Outcome: Progressing   Problem: RH SAFETY Goal: RH STG ADHERE TO SAFETY PRECAUTIONS W/ASSISTANCE/DEVICE Description: STG Adhere to Safety Precautions With min Assistance/Device. Outcome: Progressing   Problem: RH PAIN MANAGEMENT Goal: RH STG PAIN MANAGED AT OR BELOW PT'S PAIN GOAL Description: < 4 Outcome: Progressing

## 2019-05-31 NOTE — Progress Notes (Signed)
[place] PHYSICAL MEDICINE & REHABILITATION PROGRESS NOTE  Subjective/Complaints: Patient seen sitting up in bed this morning.  He states he slept well overnight.  He states he is eating better.  Discussed swallowing as well as RMT with therapies.  Discussed importance of protein supplementation with patient.  ROS: Denies CP, SOB, N/V/D  Objective:   No results found. Recent Labs    05/28/19 1320  WBC 10.8*  HGB 9.3*  HCT 30.0*  PLT 135*   Recent Labs    05/28/19 1320  NA 122*  K 3.9  CL 86*  CO2 26  GLUCOSE 122*  BUN 78*  CREATININE 5.37*  CALCIUM 7.8*    Intake/Output Summary (Last 24 hours) at 05/31/2019 1118 Last data filed at 05/31/2019 0800 Gross per 24 hour  Intake 145 ml  Output 2000 ml  Net -1855 ml     Physical Exam: Vital Signs Blood pressure (!) 121/45, pulse 86, temperature 98.2 F (36.8 C), temperature source Oral, resp. rate 17, height 5\' 8"  (1.727 m), weight 86.8 kg, SpO2 95 %. Constitutional: No distress . Vital signs reviewed. HENT: Normocephalic.  Atraumatic.  + Stoma. Eyes: EOMI. No discharge. Cardiovascular: No JVD. Respiratory: Normal effort.  No stridor.  + Pleurx. GI: Non-distended. Skin: Wounds not examined today. Psych: Flat. Musc: Generalized edema. Neuro: Alert Motor: 4 --4/5 throughout, unchanged  Assessment/Plan: 1. Functional deficits secondary to debility which require 3+ hours per day of interdisciplinary therapy in a comprehensive inpatient rehab setting.  Physiatrist is providing close team supervision and 24 hour management of active medical problems listed below.  Physiatrist and rehab team continue to assess barriers to discharge/monitor patient progress toward functional and medical goals  Care Tool:  Bathing    Body parts bathed by patient: Right arm, Left arm, Chest, Abdomen, Right upper leg, Left upper leg, Face   Body parts bathed by helper: Front perineal area, Buttocks, Right lower leg, Left lower leg      Bathing assist Assist Level: Moderate Assistance - Patient 50 - 74%     Upper Body Dressing/Undressing Upper body dressing   What is the patient wearing?: Pull over shirt    Upper body assist Assist Level: Moderate Assistance - Patient 50 - 74%    Lower Body Dressing/Undressing Lower body dressing      What is the patient wearing?: Incontinence brief, Pants     Lower body assist Assist for lower body dressing: Total Assistance - Patient < 25%     Toileting Toileting    Toileting assist Assist for toileting: Total Assistance - Patient < 25%     Transfers Chair/bed transfer  Transfers assist  Chair/bed transfer activity did not occur: Safety/medical concerns(nausea/dizziness/fatigue)  Chair/bed transfer assist level: Maximal Assistance - Patient 25 - 49%     Locomotion Ambulation   Ambulation assist   Ambulation activity did not occur: Safety/medical concerns(nausea/dizziness/fatigue)  Assist level: Moderate Assistance - Patient 50 - 74% Assistive device: Walker-rolling Max distance: 8 ft   Walk 10 feet activity   Assist  Walk 10 feet activity did not occur: Safety/medical concerns        Walk 50 feet activity   Assist Walk 50 feet with 2 turns activity did not occur: Safety/medical concerns         Walk 150 feet activity   Assist Walk 150 feet activity did not occur: Safety/medical concerns         Walk 10 feet on uneven surface  activity   Assist Walk  10 feet on uneven surfaces activity did not occur: Safety/medical concerns         Wheelchair     Assist Will patient use wheelchair at discharge?: No(sternal precautions) Type of Wheelchair: Manual    Wheelchair assist level: Supervision/Verbal cueing Max wheelchair distance: 50 ft    Wheelchair 50 feet with 2 turns activity    Assist        Assist Level: Supervision/Verbal cueing   Wheelchair 150 feet activity     Assist          Blood pressure  (!) 121/45, pulse 86, temperature 98.2 F (36.8 C), temperature source Oral, resp. rate 17, height 5\' 8"  (1.727 m), weight 86.8 kg, SpO2 95 %.  Medical Problem List and Plan: 1.Debilitysecondary to CAD status post CABG 03/15/2019. Impella device removed 04/10/2019. Sternal precautions  Continue CIR   Nutrition remains barrier for discharge 2. Antithrombotics: -DVT/anticoagulation:SCDs- due to hematuria -antiplatelet therapy: Aspirin 81 mg daily 3. Pain Management:Oxycodone as needed  Complains of leg pain, but not taking as needed medications.   Controlled on 4/14 4. Mood:Provide emotional support   Remeron started on 4/14 -antipsychotic agents: N/A 5. Neuropsych: This patientiscapable of making decisions on hisown behalf. 6. Skin/Wound Care:Routine skin checks 7. Fluids/Electrolytes/Nutrition:Monitor I/Os 8. Acute systolic congestive heart failure/cardiogenic shock. Follow-up cardiology services Filed Weights   05/30/19 1326 05/30/19 1707 05/31/19 0601  Weight: 87.2 kg 84.8 kg 86.8 kg   ?  Increased on 4/15 9. Acute hypoxic respiratory failure with Pseudomonas pneumonia. Status post tracheostomy 03/27/2019. Decannulated 04/28/2019.  Weaned supplemental oxygen  10.Acute on chronic anemia/thrombocytopenia. No more heparin for HD for now  Hemoglobin 9.3 on 4/12, labs ordered for tomorrow  Platelets 135 on 4/12, labs ordered for tomorrow  HIT negative 11. Dysphagia/nausea. Dysphagia #2 thins/pure liquids. Follow-up speech therapy.Nasogastric tube/cortrakfor nutritional support.   Dietary follow-up- see dietary note dated 4/6 for additional information.   -PEG not safe option as per cardiothoracic surgery team, will consider follow-up with CTS if necessary.   4/9- Added Phenergan 12.5 mg Q6 hours prn for nausea since zofran doesn't workAdded scopolamine patch for nausea and dizziness.   KUB personally reviewed, bowel gas pattern.  Vascular  calcifications-?  SMA, will continue to monitor.  Encourage patient to have son bring in food that patient enjoys and have it modified, reminded again  P.o. intake remains limited, but some improvement   ECG reviewed, borderline QTC-trial low-dose Reglan with meals on 4/14  Remeron started on 4/14  CMP pending 12. 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 Patient has been transitioned to dialysis and being sent to OP HD after CIR, so is chronicand followed by renal services. Tunneled L IJcatheter placed 04/24/2019, exchanged 13. Hematuria. Cystoscopy completed with evacuation of clot. No further bleeding episodes per notes, intermittent bleeding.   Continue to monitor 14. Left pleural effusion. Status post Pleurx catheter 04/23/2019.PATIENT WILL KEEP PLEURX CATHETER FOR NOW PER CVTS.Currently being drained Mondaysand Thursdays 15. Diffuse drug rashversus contact dermatitis. Appears to be resolving. Steroid taper completed   Benadryl prn, Sarna lotion, hypoallergenic sheets.   Improving 16.Rectal bleeding. 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 Follow-up GI with conservative care no further bleeding reported. 17. PAF. Patient remains normal sinus rhythm. Currently off amiodarone. No plan to resume any anticoagulation at this time until hematuria fully resolved.  Heart rate controlled on 4/15 18.  Prediabetes  Monitor with increased mobility  19.  Leukocytosis  WBCs 10.8 on 4/12,  labs ordered for tomorrow  Afebrile  LOS: 9 days A FACE TO FACE EVALUATION WAS PERFORMED  Jesica Goheen Lorie Phenix 05/31/2019, 11:18 AM

## 2019-05-31 NOTE — Progress Notes (Signed)
Patient ID: Jesse Murphy, male   DOB: August 23, 1928, 86 y.o.   MRN: [id number]  [facility] Progress Note   Assessment/ Plan:   1.  End-stage renal disease following persistent renal replacement dependent acute kidney Injury: With hemodynamically mediated with ATN following postoperative congestive heart failure/cardiogenic shock.  On renal replacement therapy since 2/11 and appears to be tolerating intermittent hemodialysis without problems at this time.  Anuric without any evidence of renal recovery for which we will continue MWF dialysis.  We will need to demonstrate satisfactory progress and ability to sit in recliner for successful outpatient dialysis. 2.  Acute systolic heart failure/cardiogenic shock postoperatively: Transiently required Impella support.  Hemodynamically stable at this time and ongoing intensive inpatient rehabilitation. 3.  Coronary artery disease status post three-vessel CABG 4.  Anemia: With acute/critical illness and previously significant hematuria. 5.  Nutrition: Low albumin likely negative acute phase reactant, monitor with ongoing diet/ONS. 6.  Secondary hyperparathyroidism: Calcium and phosphorus levels currently at goal on calcium acetate, monitor with HD. 7.  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  Subjective:   Without acute events overnight, denies any chest pain or shortness of breath  Objective:   BP (!) 121/45 (BP Location: Right Arm)   Pulse 86   Temp 98.2 F (36.8 C) (Oral)   Resp 17   Ht 5\' 8"  (1.727 m)   Wt 86.8 kg   SpO2 95%   BMI 29.10 kg/m   Intake/Output Summary (Last 24 hours) at 05/31/2019 0934 Last data filed at 05/31/2019 0800 Gross per 24 hour  Intake 145 ml  Output 2000 ml  Net -1855 ml   Weight change: -2.8 kg  Physical Exam: Gen: Resting comfortably in bed, engages easily in conversation CVS: Pulse regular rhythm, normal rate, S1 and S2 normal Resp:  Anteriorly clear to auscultation, no rales.  Left IJ TDC Abd: Soft, obese, nontender Ext: Trace dependent edema.  No lower extremity edema.  Imaging: No results found.  Labs: BMET Recent Labs  Lab 05/25/19 0514 05/28/19 1320  NA 126* 122*  K 3.8 3.9  CL 90* 86*  CO2 25 26  GLUCOSE 159* 122*  BUN 96* 78*  CREATININE 5.54* 5.37*  CALCIUM 7.6* 7.8*  PHOS  --  2.4*   CBC Recent Labs  Lab 05/28/19 1320  WBC 10.8*  HGB 9.3*  HCT 30.0*  MCV 91.7  PLT 135*    Medications:    . aspirin  81 mg Oral Daily  . calcium acetate (Phos Binder)  667 mg Oral TID WC  . Chlorhexidine Gluconate Cloth  6 each Topical Q0600  . famotidine  10 mg Oral Daily  . Gerhardt's butt cream   Topical BID  . hydrocortisone   Rectal BID  . hydrocortisone  25 mg Rectal BID  . hydrocortisone cream   Topical BID  . insulin aspart  0-15 Units Subcutaneous TID WC  . metoCLOPramide  5 mg Oral TID AC & HS  . mirtazapine  7.5 mg Oral QHS  . multivitamin  1 tablet Oral QHS  . ondansetron  4 mg Oral Once  . scopolamine  1 patch Transdermal Q72H  . simethicone  80 mg Oral TID   Elmarie Shiley, MD 05/31/2019, 9:34 AM

## 2019-05-31 NOTE — Progress Notes (Signed)
Occupational Therapy Session Note  Patient Details  Name: Jesse Murphy MRN: [id number] Date of Birth: 24-May-1928  Today's Date: 05/31/2019 OT Individual Time: 7473-4037 OT Individual Time Calculation (min): 20 min  and Today's Date: 05/31/2019 OT Missed Time: 10 Minutes Missed Time Reason: Patient fatigue   Short Term Goals: Week 2:  OT Short Term Goal 1 (Week 2): Pt will rise to stand with mod A of 1 to RW to prepare for LB self care. OT Short Term Goal 2 (Week 2): Pt will complete a stand pivot to [facility] with RW with min A. OT Short Term Goal 3 (Week 2): Pt will be able to don a shirt with set up. OT Short Term Goal 4 (Week 2): Pt will demonstrate improved activity tolerance to stay up in his wc for at least 1 hour at a time.  Skilled Therapeutic Interventions/Progress Updates:    Pt received sitting in w/c. He stated he ate more today and participated with PT this am. Pt agreeable to working on his shoulder AROM and strength as he has very limited ROM which is inhibiting his ability to don a shirt. Using a 1 # dowel bar: A/arom sh flexion to 95 degrees AROM elbow flexion/ext with pronation and then with supination Torso twists  Pt able to do 5-10 reps at a time and needed short rests. After 20 min,pt stated he could not tolerate anymore and was exhausted.   Pt has increased edema in his hands.  Opted to continue to sit in wc .  All needs met.   Therapy Documentation Precautions:  Precautions Precautions: Fall Precaution Booklet Issued: Yes (comment) Precaution Comments: monitor for orthostatic hypotension Restrictions Weight Bearing Restrictions: No RUE Weight Bearing: Weight bearing as tolerated RUE Partial Weight Bearing Percentage or Pounds: No restrictions Other Position/Activity Restrictions: no longer has sternal precautions or UE PWB precautions - confirmed with Dr. Dagoberto Ligas on 4/9  Pain: no c/o pain   ADL: ADL Upper Body Bathing: Setup Where Assessed-Upper Body  Bathing: Edge of bed Lower Body Bathing: Maximal assistance Where Assessed-Lower Body Bathing: Edge of bed Upper Body Dressing: Moderate assistance Where Assessed-Upper Body Dressing: Edge of bed Lower Body Dressing: Dependent Where Assessed-Lower Body Dressing: Edge of bed Toileting: Dependent Where Assessed-Toileting: Bedside Commode Toilet Transfer: Moderate assistance Toilet Transfer Method: Stand pivot Toilet Transfer Equipment: Bedside commode   Therapy/Group: Individual Therapy  Beaulah Romanek 05/31/2019, 11:49 AM

## 2019-05-31 NOTE — Progress Notes (Signed)
Team Conference Report to Patient/Family  Team Conference discussion was reviewed with the patient and son, including goals for Minimal assistance, any changes in plan of care and target discharge date.  Patient and son expressed understanding and are in agreement.  The patient has a target discharge date of 06/14/19. With HD M/W and change over to T,H,Sa HD on Saturday at the [facility] in [place]. Offered application for [facility] [place] and patient and son have declined services at this time with a note they can seek application from the kidney center if they change their minds later. Son would like for the patient to be on a M-W-F HD schedule at discharge if possible due to family support available to help transport patient to and from HD however notes they will work things out. Reviewed HH PT, OT, SLP services arranged with [facility] will be following the patient at discharge . Family Education set up for 4/27 @ 1p.m.  [name] 05/31/2019, 4:01 PM

## 2019-05-31 NOTE — Progress Notes (Signed)
Physical Therapy Session Note  Patient Details  Name: Jesse Murphy MRN: [id number] Date of Birth: 1928/10/24  Today's Date: 05/31/2019 PT Individual Time: 5701-7793 and 9030-0923 PT Individual Time Calculation (min): 42 min and 29 min  Short Term Goals: Week 2:  PT Short Term Goal 1 (Week 2): Pt will transfer bed<>chair min assist and LRAD PT Short Term Goal 2 (Week 2): Pt will perform sit<>stand with min assist and RW PT Short Term Goal 3 (Week 2): Pt will ambulate x 20 ft with mod assist and LRAD  Skilled Therapeutic Interventions/Progress Updates:    Session 1: Pt received supine in bed asleep, but easily awakens to verbal stimulus and agreeable to therapy session. Pt reports that he has not eaten breakfast yet this AM. Supine>sit L EOB, HOB slightly elevated and using bedrail, with min assist for trunk upright and max cuing for logroll technique to increase pt independence. Sitting EOB, with supervision for trunk control ate small amount of breakfast with intermittent min assist to manage utensils due to B UE edema and full supervision for swallowing safety. Therapist donned shoes max assist for time management. Performed repeated sit<>stands slightly elevated EOB<>RW with mod assist for lifting into standing - tolerated standing ~30seconds each bout with encouragement and cuing for increased trunk/hip extension to improve upright posture as well as min assist for balance - progressed to marching in place and lateral side stepping L towards HOB. Sit>supine with min assist for B LE management into the bed and max cuing for sequencing to increase pt independence. Pt left supine in bed with needs in reach, B UEs elevated on pillows for edema management, and bed alarm on.  Session 2: Pt received supine in bed and agreeable to therapy session. Supine>sitting L EOB with min assist as described above. Sitting EOB therapist donned shoes max assist. R stand pivot transfer to w/c using RW from slightly  elevated EOB with mod assist for lifting into standing and mod assist for balance - cuing for sequencing LE stepping and AD management.  Transported to/from gym in w/c for time management and energy conservation. Therapist retrieved pt a taller w/c to improve seat height for transfers as well as cushion for increased pressure relief and w/c with custom back fort increased trunk support. Stand pivot w/c<>EOM x2 using RW with heavy mod assist for lifting into standing due to delayed trunk/hip/knee extension coming to stand due to increasing fatigue and min/mod assist for balance while turning with cuing for sequencing - continues to demonstrate trunk flexion with increased B UE support/WBing on RW. Transported back to room in w/c and pt agreeable to remain seated in w/c for 88minutes until OT session - left with needs in reach and seat belt alarm on.  Therapy Documentation Precautions:  Precautions Precautions: Fall Precaution Booklet Issued: Yes (comment) Precaution Comments: monitor for orthostatic hypotension Restrictions Weight Bearing Restrictions: No RUE Weight Bearing: Weight bearing as tolerated RUE Partial Weight Bearing Percentage or Pounds: No restrictions Other Position/Activity Restrictions: no longer has sternal precautions or UE PWB precautions - confirmed with Dr. Dagoberto Ligas on 4/9  Pain:   Session 1: Reports some back discomfort sitting EOB but does not limit participation - therapist provided repositioning for comfort.   Session 2: No reports of pain throughout session.   Therapy/Group: Individual Therapy  Tawana Scale, PT, DPT 05/31/2019, 1:04 PM

## 2019-05-31 NOTE — Progress Notes (Signed)
Speech Language Pathology Weekly Progress and Session Note  Patient Details  Name: Jesse Murphy MRN: [id number] Date of Birth: 1928/08/08  Beginning of progress report period: May 23, 2019 End of progress report period: May 31, 2019  Today's Date: 05/31/2019 SLP Individual Time: 0900-0940 SLP Individual Time Calculation (min): 40 min  Short Term Goals: Week 1: SLP Short Term Goal 1 (Week 1): Pt will consume current diet with minimal s/sx aspiration and efficient mastication and oral clearance with Min A verbal/visual cues for use of compensatory strategies SLP Short Term Goal 1 - Progress (Week 1): Met SLP Short Term Goal 2 (Week 1): Pt will sustain attention to functional tasks with Min A verbal/visual cues for reidrection SLP Short Term Goal 2 - Progress (Week 1): Met SLP Short Term Goal 3 (Week 1): Pt will use external aids to orient to date with Min A verbal/visual cues SLP Short Term Goal 3 - Progress (Week 1): Not met SLP Short Term Goal 4 (Week 1): Pt will demonstrate ability to problem solve basic familiar tasks with Min A verbal/visual cues SLP Short Term Goal 4 - Progress (Week 1): Not met    New Short Term Goals: Week 2: SLP Short Term Goal 1 (Week 2): Pt will consume current diet with minimal s/sx aspiration and efficient mastication and oral clearance with supervision cues for use of compensatory strategies SLP Short Term Goal 2 (Week 2): Pt will demonstrate selective attention to functional tasks for 10  minutes with Min A verbal/visual cues for redirection. SLP Short Term Goal 3 (Week 2): Pt will use external aids to orient to date with Min A verbal/visual cues SLP Short Term Goal 4 (Week 2): Pt will demonstrate ability to problem solve basic familiar tasks with Min A verbal/visual cues  Weekly Progress Updates: Pt has made slow progress over this reporting period. As a result, he met 2 out of 4 STGs. Skilled ST has targeted dysphagia and increasing pt's PO consumption.  He is able to utilize chin tuck with Min A cues and he has increased his PO intake slightly over this reporting period. ST will continue targeting trials of advanced textures as well as cognitive deficits. Pt continues to require ST services to increase functional independence and reduce caregiver burden.    Intensity: Minumum of 1-2 x/day, 30 to 90 minutes Frequency: 3 to 5 out of 7 days Duration/Length of Stay: 4/29 Treatment/Interventions: Dysphagia/aspiration precaution training;Functional tasks;Patient/family education;Cueing hierarchy;Cognitive remediation/compensation;Environmental controls;Internal/external aids   Daily Session  Skilled Therapeutic Interventions: Skilled treatment session targeted pt's dysphagia and cognition goals. SLP facilitiated session by providing skilled observation of pt consuming regular texture snack (Lorne [employer] cookies), ice cream and ginger ale via straw. Pt required Min A cues for use of chin tuck during consuming of thin liquids. He demonstrated intermittent coughing when he was first repositioned in bed and this cough occurred x 3 in session but didn't appear related to PO intake. Subjectively, pt's voice was intermittently wet sounding and with cue to swallow again, pt able to improve vocal clarity. Pt's cough is weaker and this Probation officer contacted pt's MD for permission to target RMST. Pt with complete oral clearing during consumption and he was able to feed himself. Pt required Min A cues for selective attention to task and Mod A cues for recall of orientation information as well as recall that he had HD on previous day. Pt left upright in bed, bed alarm on and all needs within reach. Continue per current plan  of care.       General    Pain    Therapy/Group: Individual Therapy  Kimberlynn Lumbra 05/31/2019, 9:43 AM

## 2019-05-31 NOTE — Progress Notes (Signed)
Speech Language Pathology Daily Session Note  Patient Details  Name: Jesse Murphy MRN: [id number] Date of Birth: 1929-02-12  Today's Date: 05/31/2019 SLP Individual Time: 0370-9643 SLP Individual Time Calculation (min): 55 min  Short Term Goals: Week 2: SLP Short Term Goal 1 (Week 2): Pt will consume current diet with minimal s/sx aspiration and efficient mastication and oral clearance with supervision cues for use of compensatory strategies SLP Short Term Goal 2 (Week 2): Pt will demonstrate selective attention to functional tasks for 10  minutes with Min A verbal/visual cues for redirection. SLP Short Term Goal 3 (Week 2): Pt will use external aids to orient to date with Min A verbal/visual cues SLP Short Term Goal 4 (Week 2): Pt will demonstrate ability to problem solve basic familiar tasks with Min A verbal/visual cues  Skilled Therapeutic Interventions:  Skilled treatment session targeted pt's dysphagia and cognition goals as well as education with pt and his son. SLP facilitated session by providing skilled observation of pt consuming thin liquids via straw and graham crackers. Pt consumed without overt s/s of aspiration or dysphagia. Pt demonstrated self-limiting behaviors such as consuming nutritional in styrofoam cup but immediately refuses on on previous day when in the carton. SLP provided Total A to recall that he had refused his lunch tray with his NT. Additional education was provided to pt and his son on pt's improving oropharyngeal abilities with plan created to trial a regular breakfast tray on 06/01/19. ST to continue following.      Pain    Therapy/Group: Individual Therapy  Dream Nodal 05/31/2019, 2:56 PM

## 2019-05-31 NOTE — Progress Notes (Signed)
When  administering evening medication pt had trouble swallowing one of his medication. He was unable to get it down. He coughed for a while and was abe to cough it up after some time.  Med was whole in applesauce. Recommending splitting larger medications in half. No other problems to note, will continue to monitor.

## 2019-05-31 NOTE — Progress Notes (Signed)
Calorie Count Note  48 hour calorie count ordered.  Diet: Dysphagia 2, Thin liquids Supplements: Boost Breeze TID  Day 1 results: Breakfast: 0 kcals, 0 grams protein Lunch: 0 kcals, 0 grams protein Dinner: 89 kcals, 6 grams protein Supplements: refused all supplements  Total intake: 89 kcal (4% of minimum estimated needs)  6 grams protein (4% of minimum estimated needs)  Day 2 results: Breakfast: 0 kcals, 0 grams protein Lunch: 0 kcals, 0 grams protein Dinner: 76 kcals, 3 grams protein Supplements: refused all supplements  Total intake: 76 kcal (3% of minimum estimated needs)  3 grams protein (2% of minimum estimated needs)  Nutrition Dx: Increased nutrient needsrelated to wound healingas evidenced by estimated needs.  Goal: Patient will meet greater than or equal to 90% of their needs  Intervention:  -d/c calorie count -d/c Boost Breeze po TID, each supplement provides 250 kcal and 9 grams of protein  Pt is not meeting his needs with oral intake. Pt has refused all nutrition supplements. Consider nutrition support vs comfort feeds.   Larkin Ina, MS, RD, LDN RD pager number and weekend/on-call pager number located in [place].

## 2019-06-01 ENCOUNTER — Inpatient Hospital Stay (HOSPITAL_COMMUNITY): Payer: Medicare Other | Admitting: Speech Pathology

## 2019-06-01 ENCOUNTER — Inpatient Hospital Stay (HOSPITAL_COMMUNITY): Payer: Medicare Other | Admitting: Occupational Therapy

## 2019-06-01 ENCOUNTER — Inpatient Hospital Stay (HOSPITAL_COMMUNITY): Payer: Medicare Other | Admitting: Physical Therapy

## 2019-06-01 DIAGNOSIS — E46 Unspecified protein-calorie malnutrition: Secondary | ICD-10-CM

## 2019-06-01 DIAGNOSIS — E8809 Other disorders of plasma-protein metabolism, not elsewhere classified: Secondary | ICD-10-CM

## 2019-06-01 LAB — CBC WITH DIFFERENTIAL/PLATELET
Abs Immature Granulocytes: 0.16 10*3/uL — ABNORMAL HIGH (ref 0.00–0.07)
Basophils Absolute: 0 10*3/uL (ref 0.0–0.1)
Basophils Relative: 1 %
Eosinophils Absolute: 1.3 10*3/uL — ABNORMAL HIGH (ref 0.0–0.5)
Eosinophils Relative: 20 %
HCT: 28.1 % — ABNORMAL LOW (ref 39.0–52.0)
Hemoglobin: 8.7 g/dL — ABNORMAL LOW (ref 13.0–17.0)
Immature Granulocytes: 2 %
Lymphocytes Relative: 5 %
Lymphs Abs: 0.4 10*3/uL — ABNORMAL LOW (ref 0.7–4.0)
MCH: 28.5 pg (ref 26.0–34.0)
MCHC: 31 g/dL (ref 30.0–36.0)
MCV: 92.1 fL (ref 80.0–100.0)
Monocytes Absolute: 0.7 10*3/uL (ref 0.1–1.0)
Monocytes Relative: 10 %
Neutro Abs: 4.1 10*3/uL (ref 1.7–7.7)
Neutrophils Relative %: 62 %
Platelets: 157 10*3/uL (ref 150–400)
RBC: 3.05 MIL/uL — ABNORMAL LOW (ref 4.22–5.81)
RDW: 18.5 % — ABNORMAL HIGH (ref 11.5–15.5)
WBC: 6.6 10*3/uL (ref 4.0–10.5)
nRBC: 0 % (ref 0.0–0.2)

## 2019-06-01 LAB — COMPREHENSIVE METABOLIC PANEL
ALT: 11 U/L (ref 0–44)
AST: 13 U/L — ABNORMAL LOW (ref 15–41)
Albumin: 2.1 g/dL — ABNORMAL LOW (ref 3.5–5.0)
Alkaline Phosphatase: 83 U/L (ref 38–126)
Anion gap: 11 (ref 5–15)
BUN: 46 mg/dL — ABNORMAL HIGH (ref 8–23)
CO2: 26 mmol/L (ref 22–32)
Calcium: 8 mg/dL — ABNORMAL LOW (ref 8.9–10.3)
Chloride: 97 mmol/L — ABNORMAL LOW (ref 98–111)
Creatinine, Ser: 5.72 mg/dL — ABNORMAL HIGH (ref 0.61–1.24)
GFR calc Af Amer: 9 mL/min — ABNORMAL LOW (ref >60)
GFR calc non Af Amer: 8 mL/min — ABNORMAL LOW (ref >60)
Glucose, Bld: 123 mg/dL — ABNORMAL HIGH (ref 70–99)
Potassium: 3.8 mmol/L (ref 3.5–5.1)
Sodium: 134 mmol/L — ABNORMAL LOW (ref 135–145)
Total Bilirubin: 0.6 mg/dL (ref 0.3–1.2)
Total Protein: 5 g/dL — ABNORMAL LOW (ref 6.5–8.1)

## 2019-06-01 LAB — GLUCOSE, CAPILLARY
Glucose-Capillary: 113 mg/dL — ABNORMAL HIGH (ref 70–99)
Glucose-Capillary: 82 mg/dL (ref 70–99)
Glucose-Capillary: 85 mg/dL (ref 70–99)
Glucose-Capillary: 93 mg/dL (ref 70–99)
Glucose-Capillary: 96 mg/dL (ref 70–99)
Glucose-Capillary: 96 mg/dL (ref 70–99)

## 2019-06-01 MED ORDER — SODIUM CHLORIDE 0.9% FLUSH
10.0000 mL | Freq: Two times a day (BID) | INTRAVENOUS | Status: DC
  Administered 2019-06-02 – 2019-06-13 (×9): 10 mL

## 2019-06-01 MED ORDER — HEPARIN SODIUM (PORCINE) 1000 UNIT/ML IJ SOLN
INTRAMUSCULAR | Status: AC
  Administered 2019-06-01: 1000 [IU]
  Filled 2019-06-01: qty 4

## 2019-06-01 MED ORDER — PRO-STAT SUGAR FREE PO LIQD
30.0000 mL | Freq: Two times a day (BID) | ORAL | Status: DC
  Administered 2019-06-02 – 2019-06-12 (×5): 30 mL via ORAL
  Filled 2019-06-01 (×21): qty 30

## 2019-06-01 MED ORDER — SODIUM CHLORIDE 0.9% FLUSH: 10.0000 mL | INTRAVENOUS | Status: DC | PRN

## 2019-06-01 NOTE — Plan of Care (Signed)
  Problem: Consults Goal: RH GENERAL PATIENT EDUCATION Description: See Patient Education module for education specifics. Outcome: Progressing Goal: Skin Care Protocol Initiated - if Braden Score 18 or less Description: If consults are not indicated, leave blank or document N/A Outcome: Progressing Goal: Nutrition Consult-if indicated Outcome: Progressing Goal: Diabetes Guidelines if Diabetic/Glucose > 140 Description: If diabetic or lab glucose is > 140 mg/dl - Initiate Diabetes/Hyperglycemia Guidelines & Document Interventions  Outcome: Progressing   Problem: RH BOWEL ELIMINATION Goal: RH STG MANAGE BOWEL WITH ASSISTANCE Description: STG Manage Bowel with min Assistance. Outcome: Progressing Goal: RH STG MANAGE BOWEL W/MEDICATION W/ASSISTANCE Description: STG Manage Bowel with Medication with min Assistance. Outcome: Progressing   Problem: RH BLADDER ELIMINATION Goal: RH STG MANAGE BLADDER WITH ASSISTANCE Description: STG Manage Bladder With min/mod Assistance Outcome: Progressing   Problem: RH SKIN INTEGRITY Goal: RH STG SKIN FREE OF INFECTION/BREAKDOWN Description: Patients skin will remain free from FURTHER breakdown or infection with min/mod assist. Outcome: Progressing Goal: RH STG MAINTAIN SKIN INTEGRITY WITH ASSISTANCE Description: STG Maintain Skin Integrity With min/mod Assistance. Outcome: Progressing Goal: RH STG ABLE TO PERFORM INCISION/WOUND CARE W/ASSISTANCE Description: STG Able To Perform Incision/Wound Care With total assistance from caregiver. Outcome: Progressing   Problem: RH SAFETY Goal: RH STG ADHERE TO SAFETY PRECAUTIONS W/ASSISTANCE/DEVICE Description: STG Adhere to Safety Precautions With min Assistance/Device. Outcome: Progressing   Problem: RH PAIN MANAGEMENT Goal: RH STG PAIN MANAGED AT OR BELOW PT'S PAIN GOAL Description: < 4 Outcome: Progressing

## 2019-06-01 NOTE — Progress Notes (Signed)
Speech Language Pathology Daily Session Note  Patient Details  Name: Jesse Murphy MRN: [id number] Date of Birth: 1928-12-19  Today's Date: 06/01/2019 SLP Individual Time: 0822-0900 SLP Individual Time Calculation (min): 38 min  Short Term Goals: Week 2: SLP Short Term Goal 1 (Week 2): Pt will consume current diet with minimal s/sx aspiration and efficient mastication and oral clearance with supervision cues for use of compensatory strategies SLP Short Term Goal 2 (Week 2): Pt will demonstrate selective attention to functional tasks for 10  minutes with Min A verbal/visual cues for redirection. SLP Short Term Goal 3 (Week 2): Pt will use external aids to orient to date with Min A verbal/visual cues SLP Short Term Goal 4 (Week 2): Pt will demonstrate ability to problem solve basic familiar tasks with Min A verbal/visual cues  Skilled Therapeutic Interventions:  Skilled treatment session targeted pt's dysphagia and cognition goals. SLP facilitated session by providing skilled observation of pt consuming regular breakfast tray with thin liquids via straw. Pt consumed bacon, fresh fruit and English Muffin without any overt s/s of oropharyngeal dysphagia. Pt also consumed 4 sips of Boost Breeze (presented in styrofoam cup) with intermittent reminders o perform chin tuck. Of note, pt was completely free of all coughing before and during consumption.   SLP also facilitated pt's ability to recall information by providing Max A to Mod A to recall orientation information as well as mod A for selective attention in mildly distracting environment. Pt handed off to NT for continued supervision with meal.      Pain Pain Assessment Pain Score: 0-No pain  Therapy/Group: Individual Therapy  Ryanna Teschner 06/01/2019, 1:25 PM

## 2019-06-01 NOTE — Progress Notes (Signed)
Physical Therapy Session Note  Patient Details  Name: Jesse Murphy MRN: [id number] Date of Birth: Mar 15, 1928  Today's Date: 06/01/2019 PT Individual Time: 0354-6568 PT Individual Time Calculation (min): 57 min   Short Term Goals: Week 2:  PT Short Term Goal 1 (Week 2): Pt will transfer bed<>chair min assist and LRAD PT Short Term Goal 2 (Week 2): Pt will perform sit<>stand with min assist and RW PT Short Term Goal 3 (Week 2): Pt will ambulate x 20 ft with mod assist and LRAD  Skilled Therapeutic Interventions/Progress Updates:    Pt received supine in bed and agreeable to therapy session. Supine>sitting L EOB, HOB slightly elevated and using bedrail, with min assist for trunk upright and mod/max cuing for sequencing of logroll technique to increase pt independence. Sitting EOB with supervision for trunk control (no LOB) therapist donned B LE TEDs and shoes max assist for time management. Therapist reinforced education regarding importance of using incentive spirometer. R stand pivot transfer slightly elevated EOB>w/c using RW with mod assist for lifting into stand and min assist for balance while turning due to posterior lean.  Transported to/from gym in w/c for time management and energy conservation. Gait training 50ft, 3ft, 59ft (seated break between each) using RW with min assist of 1 and very close +2 w/c follow because when pt ready to sit he needs to sit relatively quickly due to fatigue - demonstrates very slow speed of movement, significantly decreased gait speed, significantly decreased B LE step lengths, and increased B UE support/WBing down through RW. Pt requires prolonged seated rest breaks between tasks due to poor activity tolerance - monitored SpO2 throughout session on RA and it maintained 92% or higher. Pt reports "leaditis is coming on" reporting fatigue. Performed sit<>stands w/c<>RW with mod assist for lifting/lowering throughout session with cuing/manual facilitation for  increased anterior weight shift upon coming to stand. Standing tolerance, LE strengthening, and standing balance task, while using RW, of repeated R/L LE foot taps on 2" step with min assist for balance 2 sets with pt able to tap 7 reps per LE on 1st set progressed to 10reps per LE 2nd set prior to needing to sit. Performed standing tolerance and standing balance task of placing horseshoes on basketball goal using R UE - min assist for steadying/balance - able to place 4 horseshoes prior to needing seated rest break. Transported back to room and agreeable to remain sitting up in w/c until OT session in <91minutes - left with needs in reach and seat belt alarm on.  Therapy Documentation Precautions:  Precautions Precautions: Fall Precaution Booklet Issued: Yes (comment) Precaution Comments: monitor for orthostatic hypotension Restrictions Weight Bearing Restrictions: No RUE Weight Bearing: Weight bearing as tolerated RUE Partial Weight Bearing Percentage or Pounds: No restrictions Other Position/Activity Restrictions: no longer has sternal precautions or UE PWB precautions - confirmed with Dr. Dagoberto Ligas on 4/9  Pain:   Reports some L anterior shoulder discomfort reports it is from "pulling" himself over during bed mobility yesterday with nursing staff - educated pt importance of using core muscles and LEs to assist with rolling to avoid increased strain on UEs - rest provided for pain management during session.   Therapy/Group: Individual Therapy  Tawana Scale, PT, DPT 06/01/2019, 7:56 AM

## 2019-06-01 NOTE — Progress Notes (Signed)
Occupational Therapy Session Note  Patient Details  Name: Jesse Murphy MRN: [id number] Date of Birth: 1928/09/13  Today's Date: 06/01/2019 OT Individual Time: 9509-3267 OT Individual Time Calculation (min): 55 min (10 minutes makeup minutes)   Short Term Goals: Week 1:  OT Short Term Goal 1 (Week 1): Pt will be able to rise to stand with min - mod A to prep for clothing management with toileting. OT Short Term Goal 1 - Progress (Week 1): Progressing toward goal OT Short Term Goal 2 (Week 1): Pt will be able to squat pivot to toilet or BSC with mod- max A. OT Short Term Goal 2 - Progress (Week 1): Progressing toward goal OT Short Term Goal 3 (Week 1): Pt will don shirt with set up. OT Short Term Goal 3 - Progress (Week 1): Progressing toward goal OT Short Term Goal 4 (Week 1): Pt will don pants with mod-max A. OT Short Term Goal 4 - Progress (Week 1): Progressing toward goal  Skilled Therapeutic Interventions/Progress Updates:    Pt received in wc with his son present. Discussed his shower set up and pt's wife has a shower seat set up in tub but pt will likely need a bench.  Talked about practicing tub bench transfers next week.  Discussed where family can purchase. Updated son on his progress with OT and his LTGs.   Pt agreeable to therapy but did say he was very fatigued.  Pt worked on UE A/AROM with UE ranger for sh flexion and shoulder circles.  Used hula hoop to simulate turning a wheel and lifting to place overhead like a hat for shoulder AROM.  Attempted sit to stand several times but pt too fatigued.   Discussed his eating skills and due to edema in hands he has difficulty holding the utensils.  Obtained red foam handles and placed on fork and knife.  Pt able to cut his pot roast and feed himself the meat and greenbeans without difficulty.  Provided supervision during pt's meal until NT arrived to continue S.  Pt resting in wc with belt alarm on and all needs met.   Therapy  Documentation Precautions:  Precautions Precautions: Fall Precaution Booklet Issued: Yes (comment) Precaution Comments: monitor for orthostatic hypotension Restrictions Weight Bearing Restrictions: No RUE Weight Bearing: Weight bearing as tolerated RUE Partial Weight Bearing Percentage or Pounds: No restrictions Other Position/Activity Restrictions: no longer has sternal precautions or UE PWB precautions - confirmed with Dr. Dagoberto Ligas on 4/9 Vital Signs:  Pain:   ADL: ADL Upper Body Bathing: Setup Where Assessed-Upper Body Bathing: Edge of bed Lower Body Bathing: Maximal assistance Where Assessed-Lower Body Bathing: Edge of bed Upper Body Dressing: Moderate assistance Where Assessed-Upper Body Dressing: Edge of bed Lower Body Dressing: Dependent Where Assessed-Lower Body Dressing: Edge of bed Toileting: Dependent Where Assessed-Toileting: Bedside Commode Toilet Transfer: Moderate assistance Toilet Transfer Method: Stand pivot Toilet Transfer Equipment: Bedside commode   Therapy/Group: Individual Therapy  [place] 06/01/2019, 11:09 AM

## 2019-06-01 NOTE — Progress Notes (Signed)
Jesse Murphy PHYSICAL MEDICINE & REHABILITATION PROGRESS NOTE  Subjective/Complaints: Patient seen laying in bed this morning.  He states he slept well overnight.  He notes significant improvement in nausea.  Discussed memory and diet with nutrition and dietary.  Discussed wearing PRAFOs with nursing.  He was seen by nephrology yesterday, notes reviewed-will need to be able to tolerate with plan of outpatient dialysis.  ROS: Denies CP, SOB, N/V/D  Objective:   No results found. Recent Labs    06/01/19 0738  WBC 6.6  HGB 8.7*  HCT 28.1*  PLT 157   No results for input(s): NA, K, CL, CO2, GLUCOSE, BUN, CREATININE, CALCIUM in the last 72 hours.  Intake/Output Summary (Last 24 hours) at 06/01/2019 1046 Last data filed at 06/01/2019 0848 Gross per 24 hour  Intake 240 ml  Output -  Net 240 ml     Physical Exam: Vital Signs Blood pressure (!) 118/48, pulse 86, temperature 98.9 F (37.2 C), temperature source Oral, resp. rate 18, height 5\' 8"  (1.727 m), weight 86.8 kg, SpO2 95 %. Constitutional: No distress . Vital signs reviewed. HENT: Normocephalic.  Atraumatic.  + Stoma. Eyes: EOMI. No discharge. Cardiovascular: No JVD. Respiratory: Normal effort.  No stridor.  + Pleurx. GI: Non-distended. Skin: Wounds not examined today. Psych: Flat, some improvement. Musc: Generalized edema. Neuro: Alert Motor: 4 --4/5 throughout, stable  Assessment/Plan: 1. Functional deficits secondary to debility which require 3+ hours per day of interdisciplinary therapy in a comprehensive inpatient rehab setting.  Physiatrist is providing close team supervision and 24 hour management of active medical problems listed below.  Physiatrist and rehab team continue to assess barriers to discharge/monitor patient progress toward functional and medical goals  Care Tool:  Bathing    Body parts bathed by patient: Right arm, Left arm, Chest, Abdomen, Right upper leg, Left upper leg, Face   Body parts  bathed by helper: Front perineal area, Buttocks, Right lower leg, Left lower leg     Bathing assist Assist Level: Moderate Assistance - Patient 50 - 74%     Upper Body Dressing/Undressing Upper body dressing   What is the patient wearing?: Pull over shirt    Upper body assist Assist Level: Moderate Assistance - Patient 50 - 74%    Lower Body Dressing/Undressing Lower body dressing      What is the patient wearing?: Incontinence brief, Pants     Lower body assist Assist for lower body dressing: Total Assistance - Patient < 25%     Toileting Toileting    Toileting assist Assist for toileting: Total Assistance - Patient < 25%     Transfers Chair/bed transfer  Transfers assist  Chair/bed transfer activity did not occur: Safety/medical concerns(nausea/dizziness/fatigue)  Chair/bed transfer assist level: Moderate Assistance - Patient 50 - 74%     Locomotion Ambulation   Ambulation assist   Ambulation activity did not occur: Safety/medical concerns(nausea/dizziness/fatigue)  Assist level: Moderate Assistance - Patient 50 - 74% Assistive device: Walker-rolling Max distance: 8 ft   Walk 10 feet activity   Assist  Walk 10 feet activity did not occur: Safety/medical concerns        Walk 50 feet activity   Assist Walk 50 feet with 2 turns activity did not occur: Safety/medical concerns         Walk 150 feet activity   Assist Walk 150 feet activity did not occur: Safety/medical concerns         Walk 10 feet on uneven surface  activity  Assist Walk 10 feet on uneven surfaces activity did not occur: Safety/medical concerns         Wheelchair     Assist Will patient use wheelchair at discharge?: No(sternal precautions) Type of Wheelchair: Manual    Wheelchair assist level: Supervision/Verbal cueing Max wheelchair distance: 50 ft    Wheelchair 50 feet with 2 turns activity    Assist        Assist Level: Supervision/Verbal  cueing   Wheelchair 150 feet activity     Assist          Blood pressure (!) 118/48, pulse 86, temperature 98.9 F (37.2 C), temperature source Oral, resp. rate 18, height 5\' 8"  (1.727 m), weight 86.8 kg, SpO2 95 %.  Medical Problem List and Plan: 1.Debilitysecondary to CAD status post CABG 03/15/2019. Impella device removed 04/10/2019. Sternal precautions  Continue CIR  2. Antithrombotics: -DVT/anticoagulation:SCDs- due to hematuria -antiplatelet therapy: Aspirin 81 mg daily 3. Pain Management:Oxycodone as needed  Controlled on 4/16 4. Mood:Provide emotional support   Remeron started on 4/14 with improvement -antipsychotic agents: N/A 5. Neuropsych: This patientiscapable of making decisions on hisown behalf. 6. Skin/Wound Care:Routine skin checks  PRAFO's for bilateral lower extremities 7. Fluids/Electrolytes/Nutrition:Monitor I/Os 8. Acute systolic congestive heart failure/cardiogenic shock. Follow-up cardiology services Filed Weights   05/30/19 1326 05/30/19 1707 05/31/19 0601  Weight: 87.2 kg 84.8 kg 86.8 kg   Elevated on 4/16 9. Acute hypoxic respiratory failure with Pseudomonas pneumonia. Status post tracheostomy 03/27/2019. Decannulated 04/28/2019.  Weaned supplemental oxygen  10.Acute on chronic anemia/thrombocytopenia. No more heparin for HD for now  Hemoglobin 8.7 on 4/16  Platelets 157 on 4/16  HIT negative 11. Dysphagia/nausea.   Advance to regular thins.  Follow-up speech therapy.Nasogastric tube/cortrakfor nutritional support.   Dietary follow-up- see dietary note dated 4/6 for additional information.   -PEG not safe option as per cardiothoracic surgery team, will consider follow-up with CTS if necessary.   4/9- Added Phenergan 12.5 mg Q6 hours prn for nausea since zofran doesn't workAdded scopolamine patch for nausea and dizziness.   KUB personally reviewed, bowel gas pattern.    Encourage patient to have son  bring in food that patient enjoys and have it modified, reminded again  ECG reviewed, borderline QTC-trial low-dose Reglan with meals on 4/14, with improvement  Remeron started on 4/14  CMP not completed, ordered again  Improving oral intake 12. 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 Patient has been transitioned to dialysis and being sent to OP HD after CIR, so is chronicand followed by renal services. Tunneled L IJcatheter placed 04/24/2019, exchanged 13. Hematuria. Cystoscopy completed with evacuation of clot. No further bleeding episodes per notes, intermittent bleeding.   Continue to monitor 14. Left pleural effusion. Status post Pleurx catheter 04/23/2019.PATIENT WILL KEEP PLEURX CATHETER FOR NOW PER CVTS.Currently being drained Mondaysand Thursdays 15. Diffuse drug rashversus contact dermatitis. Appears to be resolving. Steroid taper completed   Benadryl prn, Sarna lotion, hypoallergenic sheets.   Improving 16.Rectal bleeding. 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 Follow-up GI with conservative care no further bleeding reported. 17. PAF. Patient remains normal sinus rhythm. Currently off amiodarone. No plan to resume any anticoagulation at this time until hematuria fully resolved.  Heart rate controlled on 4/16 18.  Prediabetes  Monitor with increased mobility   Labs pending 19.  Leukocytosis: Resolved  WBCs 6.6 on 4/16  Afebrile 20.  Severe hypoalbuminemia  Supplement initiated, stressed importance  LOS: 10 days A FACE TO  FACE EVALUATION WAS PERFORMED  Jesse Murphy 06/01/2019, 10:46 AM

## 2019-06-01 NOTE — Procedures (Signed)
I was present at this dialysis session. I have reviewed the session itself and made appropriate changes.   2L UF goal. TDC. 4K bath.  Tol tx well. Pt w/o c/o or needs  Filed Weights   05/30/19 1707 05/31/19 0601 06/01/19 1220  Weight: 84.8 kg 86.8 kg 87 kg    Recent Labs  Lab 05/28/19 1320 05/28/19 1320 06/01/19 1151  NA 122*   < > 134*  K 3.9   < > 3.8  CL 86*   < > 97*  CO2 26   < > 26  GLUCOSE 122*   < > 123*  BUN 78*   < > 46*  CREATININE 5.37*   < > 5.72*  CALCIUM 7.8*   < > 8.0*  PHOS 2.4*  --   --    < > = values in this interval not displayed.    Recent Labs  Lab 05/28/19 1320 06/01/19 0738  WBC 10.8* 6.6  NEUTROABS  --  4.1  HGB 9.3* 8.7*  HCT 30.0* 28.1*  MCV 91.7 92.1  PLT 135* 157    Scheduled Meds: . aspirin  81 mg Oral Daily  . calcium acetate (Phos Binder)  667 mg Oral TID WC  . Chlorhexidine Gluconate Cloth  6 each Topical Q0600  . famotidine  10 mg Oral Daily  . feeding supplement (PRO-STAT SUGAR FREE 64)  30 mL Oral BID  . Gerhardt's butt cream   Topical BID  . hydrocortisone   Rectal BID  . hydrocortisone  25 mg Rectal BID  . hydrocortisone cream   Topical BID  . insulin aspart  0-15 Units Subcutaneous TID WC  . metoCLOPramide  5 mg Oral TID AC & HS  . mirtazapine  7.5 mg Oral QHS  . multivitamin  1 tablet Oral QHS  . ondansetron  4 mg Oral Once  . scopolamine  1 patch Transdermal Q72H  . simethicone  80 mg Oral TID  . sodium chloride flush  10-40 mL Intracatheter Q12H   Continuous Infusions: . anticoagulant sodium citrate     PRN Meds:.oxyCODONE **AND** acetaminophen, camphor-menthol, diphenhydrAMINE, lidocaine, ondansetron, promethazine, sodium chloride, sodium chloride flush   Pearson Grippe  MD 06/01/2019, 3:43 PM

## 2019-06-02 ENCOUNTER — Inpatient Hospital Stay (HOSPITAL_COMMUNITY): Payer: Medicare Other | Admitting: Physical Therapy

## 2019-06-02 ENCOUNTER — Inpatient Hospital Stay (HOSPITAL_COMMUNITY): Payer: Medicare Other | Admitting: Occupational Therapy

## 2019-06-02 LAB — GLUCOSE, CAPILLARY
Glucose-Capillary: 77 mg/dL (ref 70–99)
Glucose-Capillary: 129 mg/dL — ABNORMAL HIGH (ref 70–99)
Glucose-Capillary: 121 mg/dL — ABNORMAL HIGH (ref 70–99)
Glucose-Capillary: 72 mg/dL (ref 70–99)

## 2019-06-02 NOTE — Progress Notes (Signed)
[facility] PHYSICAL MEDICINE & REHABILITATION PROGRESS NOTE  Subjective/Complaints:  Pt reports eating well- no issues- denies pain; LBM- "daily".   Denies any issues- feels he's tolerating dialysis.   ROS:   Pt denies SOB, abd pain, CP, N/V/C/D, and vision changes   Objective:   No results found. Recent Labs    06/01/19 0738  WBC 6.6  HGB 8.7*  HCT 28.1*  PLT 157   Recent Labs    06/01/19 1151  NA 134*  K 3.8  CL 97*  CO2 26  GLUCOSE 123*  BUN 46*  CREATININE 5.72*  CALCIUM 8.0*    Intake/Output Summary (Last 24 hours) at 06/02/2019 1549 Last data filed at 06/02/2019 1258 Gross per 24 hour  Intake 320 ml  Output 2000 ml  Net -1680 ml     Physical Exam: Vital Signs Blood pressure (!) 134/52, pulse 82, temperature 98.1 F (36.7 C), resp. rate 18, height 5\' 8"  (1.727 m), weight 86 kg, SpO2 100 %. Constitutional: No distress . Vital signs reviewed.sitting up in bed; family at bedside, NAD HENT: Normocephalic.  Atraumatic.  + Stoma.Covered C/D/I Eyes: conjugate gaze Cardiovascular: RRR_ no JVD Respiratory: CTA b/L  + Pleurx. GI: soft, NT< ND, (+)BS Skin: Wounds not examined today. Psych: Flat, some improvement. Musc: Generalized edema. 2+ in UEs and LEs pitting edema noted B/L Neuro: Alert Motor: 4 --4/5 throughout, stable  Assessment/Plan: 1. Functional deficits secondary to debility which require 3+ hours per day of interdisciplinary therapy in a comprehensive inpatient rehab setting.  Physiatrist is providing close team supervision and 24 hour management of active medical problems listed below.  Physiatrist and rehab team continue to assess barriers to discharge/monitor patient progress toward functional and medical goals  Care Tool:  Bathing    Body parts bathed by patient: Right arm, Left arm, Chest, Abdomen, Right upper leg, Left upper leg, Face   Body parts bathed by helper: Front perineal area, Buttocks, Right lower leg, Left lower leg      Bathing assist Assist Level: Moderate Assistance - Patient 50 - 74%     Upper Body Dressing/Undressing Upper body dressing   What is the patient wearing?: Pull over shirt    Upper body assist Assist Level: Moderate Assistance - Patient 50 - 74%    Lower Body Dressing/Undressing Lower body dressing      What is the patient wearing?: Incontinence brief, Pants     Lower body assist Assist for lower body dressing: Total Assistance - Patient < 25%     Toileting Toileting    Toileting assist Assist for toileting: Total Assistance - Patient < 25%     Transfers Chair/bed transfer  Transfers assist  Chair/bed transfer activity did not occur: Safety/medical concerns(nausea/dizziness/fatigue)  Chair/bed transfer assist level: Moderate Assistance - Patient 50 - 74% Chair/bed transfer assistive device: Programmer, multimedia   Ambulation assist   Ambulation activity did not occur: Safety/medical concerns(nausea/dizziness/fatigue)  Assist level: 2 helpers(min A of 1 and +2 w/c follow) Assistive device: Walker-rolling Max distance: 29ft   Walk 10 feet activity   Assist  Walk 10 feet activity did not occur: Safety/medical concerns  Assist level: 2 helpers(min A of 1 and +2 w/c follow) Assistive device: Walker-rolling   Walk 50 feet activity   Assist Walk 50 feet with 2 turns activity did not occur: Safety/medical concerns         Walk 150 feet activity   Assist Walk 150 feet activity did not occur: Safety/medical  concerns         Walk 10 feet on uneven surface  activity   Assist Walk 10 feet on uneven surfaces activity did not occur: Safety/medical concerns         Wheelchair     Assist Will patient use wheelchair at discharge?: No(sternal precautions) Type of Wheelchair: Manual    Wheelchair assist level: Supervision/Verbal cueing Max wheelchair distance: 50 ft    Wheelchair 50 feet with 2 turns activity    Assist         Assist Level: Supervision/Verbal cueing   Wheelchair 150 feet activity     Assist          Blood pressure (!) 134/52, pulse 82, temperature 98.1 F (36.7 C), resp. rate 18, height 5\' 8"  (1.727 m), weight 86 kg, SpO2 100 %.  Medical Problem List and Plan: 1.Debilitysecondary to CAD status post CABG 03/15/2019. Impella device removed 04/10/2019. Sternal precautions  Continue CIR  2. Antithrombotics: -DVT/anticoagulation:SCDs- due to hematuria -antiplatelet therapy: Aspirin 81 mg daily 3. Pain Management:Oxycodone as needed  Controlled on 4/16 4. Mood:Provide emotional support   Remeron started on 4/14 with improvement -antipsychotic agents: N/A 5. Neuropsych: This patientiscapable of making decisions on hisown behalf. 6. Skin/Wound Care:Routine skin checks  PRAFO's for bilateral lower extremities 7. Fluids/Electrolytes/Nutrition:Monitor I/Os 8. Acute systolic congestive heart failure/cardiogenic shock. Follow-up cardiology services Filed Weights   06/01/19 1220 06/01/19 1811 06/02/19 0323  Weight: 87 kg 85 kg 86 kg   Elevated on 4/16  4/17- weight down to 86kg- down 1 kg 9. Acute hypoxic respiratory failure with Pseudomonas pneumonia. Status post tracheostomy 03/27/2019. Decannulated 04/28/2019.  Weaned supplemental oxygen  10.Acute on chronic anemia/thrombocytopenia. No more heparin for HD for now  Hemoglobin 8.7 on 4/16  Platelets 157 on 4/16  HIT negative 11. Dysphagia/nausea.   Advance to regular thins.  Follow-up speech therapy.Nasogastric tube/cortrakfor nutritional support.   Dietary follow-up- see dietary note dated 4/6 for additional information.   -PEG not safe option as per cardiothoracic surgery team, will consider follow-up with CTS if necessary.   4/9- Added Phenergan 12.5 mg Q6 hours prn for nausea since zofran doesn't workAdded scopolamine patch for nausea and dizziness.   KUB personally reviewed, bowel  gas pattern.    Encourage patient to have son bring in food that patient enjoys and have it modified, reminded again  ECG reviewed, borderline QTC-trial low-dose Reglan with meals on 4/14, with improvement  Remeron started on 4/14  CMP not completed, ordered again  Improving oral intake  4/17- pt and family note pt is "eating well".  12. 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 Patient has been transitioned to dialysis and being sent to OP HD after CIR, so is chronicand followed by renal services. Tunneled L IJcatheter placed 04/24/2019, exchanged 13. Hematuria. Cystoscopy completed with evacuation of clot. No further bleeding episodes per notes, intermittent bleeding.   Continue to monitor 14. Left pleural effusion. Status post Pleurx catheter 04/23/2019.PATIENT WILL KEEP PLEURX CATHETER FOR NOW PER CVTS.Currently being drained Mondaysand Thursdays 15. Diffuse drug rashversus contact dermatitis. Appears to be resolving. Steroid taper completed   Benadryl prn, Sarna lotion, hypoallergenic sheets.   Improving 16.Rectal bleeding. 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 Follow-up GI with conservative care no further bleeding reported. 17. PAF. Patient remains normal sinus rhythm. Currently off amiodarone. No plan to resume any anticoagulation at this time until hematuria fully resolved.  Heart rate controlled on 4/16 18.  Prediabetes  Monitor with increased mobility   Labs pending 19.  Leukocytosis: Resolved  WBCs 6.6 on 4/16  Afebrile 20.  Severe hypoalbuminemia  Supplement initiated, stressed importance  LOS: 11 days A FACE TO FACE EVALUATION WAS PERFORMED  Williard Keller 06/02/2019, 3:49 PM

## 2019-06-02 NOTE — Progress Notes (Signed)
Physical Therapy Session Note  Patient Details  Name: Jesse Murphy MRN: [id number] Date of Birth: 05/10/1928  Today's Date: 06/02/2019 PT Individual Time: 9292-4462 PT Individual Time Calculation (min): 57 min   Short Term Goals: Week 2:  PT Short Term Goal 1 (Week 2): Pt will transfer bed<>chair min assist and LRAD PT Short Term Goal 2 (Week 2): Pt will perform sit<>stand with min assist and RW PT Short Term Goal 3 (Week 2): Pt will ambulate x 20 ft with mod assist and LRAD  Skilled Therapeutic Interventions/Progress Updates:   Pt received sitting in w/c reporting he has been sitting there since OT session early this AM. Pt agreeable to therapy session.  Transported to/from gym in w/c for time management and energy conservation. Continues to have B UE swelling/edema - repeated education regarding importance of moving arms frequently with shoulder flexion and open/closing hands "raising roof" for edema management - performed 3x. Sit<>stands using RW with mod assist for lifting into standing and eccentric lowering continuing to demonstrate delayed hip/knee extension though improving. Gait training ~55ft x4 using RW with min assist for balance while walking then mod assist for balance while turning to sit - therapist educated pt and visually demonstrated proper sequencing of turning to sit and pt able to improve to only requiring min assist - demonstrates improving gait speed, increased speed of movement, and increasing step lengths with pt demonstrating improving confidence with ambulation. B LE strengthening task of repeated R/L LE step up/down on 3" step using B HRs for support and min/mod assist for balance and lifting - performed 2 sets of 3reps L LE and 1 set of 2reps R LE. Then pt reporting increased fatigue and requesting to return to room - transported back in w/c. Stand pivot w/c>EOB using RW with mod assist for lifting into standing and min assist for balance while turning. Doffed shoes max  assist. Sit>supine with min assist for B LE management up into the bed. Pt left supine in bed with needs in reach and bed alarm on.   Continued to monitor SpO2 throughout session on RA with it maintaining >93% with activity. Continues to require prolonged seated therapeutic rest breaks between tasks to recover breathing.  Therapy Documentation Precautions:  Precautions Precautions: Fall Precaution Booklet Issued: Yes (comment) Precaution Comments: monitor for orthostatic hypotension Restrictions Weight Bearing Restrictions: No RUE Weight Bearing: Weight bearing as tolerated RUE Partial Weight Bearing Percentage or Pounds: No restrictions Other Position/Activity Restrictions: no longer has sternal precautions or UE PWB precautions - confirmed with Dr. Dagoberto Ligas on 4/9  Pain:  Denies pain during session.   Therapy/Group: Individual Therapy  Tawana Scale, PT, DPT 06/02/2019, 3:14 PM

## 2019-06-02 NOTE — Progress Notes (Signed)
Admit: 05/22/2019 LOS: 11  63M ESRD after cardiogenic shock; CAD s/p CABG  Subjective:  . HD yesterday, uneventful 2L UF . No c/o at current time   04/16 0701 - 04/17 0700 In: 440 [P.O.:440] Out: 2000   Filed Weights   06/01/19 1220 06/01/19 1811 06/02/19 0323  Weight: 87 kg 85 kg 86 kg    Scheduled Meds: . aspirin  81 mg Oral Daily  . calcium acetate (Phos Binder)  667 mg Oral TID WC  . Chlorhexidine Gluconate Cloth  6 each Topical Q0600  . famotidine  10 mg Oral Daily  . feeding supplement (PRO-STAT SUGAR FREE 64)  30 mL Oral BID  . Gerhardt's butt cream   Topical BID  . hydrocortisone   Rectal BID  . hydrocortisone  25 mg Rectal BID  . hydrocortisone cream   Topical BID  . insulin aspart  0-15 Units Subcutaneous TID WC  . metoCLOPramide  5 mg Oral TID AC & HS  . mirtazapine  7.5 mg Oral QHS  . multivitamin  1 tablet Oral QHS  . ondansetron  4 mg Oral Once  . scopolamine  1 patch Transdermal Q72H  . simethicone  80 mg Oral TID  . sodium chloride flush  10-40 mL Intracatheter Q12H   Continuous Infusions: . anticoagulant sodium citrate     PRN Meds:.oxyCODONE **AND** acetaminophen, camphor-menthol, diphenhydrAMINE, lidocaine, ondansetron, promethazine, sodium chloride, sodium chloride flush  Current Labs: reviewed    Physical Exam:  Blood pressure (!) 115/47, pulse 89, temperature 98 F (36.7 C), resp. rate 19, height 5\' 8"  (1.727 m), weight 86 kg, SpO2 93 %. NAD in wheelchair RRR nl s1s2 CTAB L IJ TDC No rashes NCAT EOMI   A 1. ESRD, new during this admission; AKI from #2 2. S/p cardiogenic shock; CAD s/p CABG 3. Debility in CIR 4. 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 Next HD 4/19 . Medication Issues; o Preferred narcotic agents for pain control are hydromorphone, fentanyl, and methadone. Morphine should not be used.  o Baclofen should be avoided o Avoid oral  sodium phosphate and magnesium citrate based laxatives / bowel preps    Pearson Grippe MD 06/02/2019, 12:11 PM  Recent Labs  Lab 05/28/19 1320 06/01/19 1151  NA 122* 134*  K 3.9 3.8  CL 86* 97*  CO2 26 26  GLUCOSE 122* 123*  BUN 78* 46*  CREATININE 5.37* 5.72*  CALCIUM 7.8* 8.0*  PHOS 2.4*  --    Recent Labs  Lab 05/28/19 1320 06/01/19 0738  WBC 10.8* 6.6  NEUTROABS  --  4.1  HGB 9.3* 8.7*  HCT 30.0* 28.1*  MCV 91.7 92.1  PLT 135* 157

## 2019-06-02 NOTE — Progress Notes (Signed)
Occupational Therapy Session Note  Patient Details  Name: Jesse Murphy MRN: [id number] Date of Birth: February 03, 1929  Today's Date: 06/02/2019 OT Individual Time: 7017-7939 OT Individual Time Calculation (min): 56 min    Short Term Goals: Week 2:  OT Short Term Goal 1 (Week 2): Pt will rise to stand with mod A of 1 to RW to prepare for LB self care. OT Short Term Goal 2 (Week 2): Pt will complete a stand pivot to [facility] with RW with min A. OT Short Term Goal 3 (Week 2): Pt will be able to don a shirt with set up. OT Short Term Goal 4 (Week 2): Pt will demonstrate improved activity tolerance to stay up in his wc for at least 1 hour at a time.  Skilled Therapeutic Interventions/Progress Updates:    Pt completed self feeding with setup to start session.  Min instructional cueing for following swallow precautions with some noted coughing with thin liquids.  He transferred to sitting with min assist and then completed stand pivot transfer to the wheelchair with mod assist using the RW for support.  He was able to then transfer to the [facility] assist level and complete 3 sets of 4 mins, 2.5 mins, and 1.5 mins.  All sets were completed on level 3 intensity.  First set the average number of steps were 35-43 during the 4 mins.  The second set the average remained around the same as the first, but he only tolerated 2 mins.  HR at 96 with O2 sats at 95% on room air.  Last set was completed with isolated use of the LEs only with pt tolerating one and a half mins with average number of steps at 32.  Had pt transfer back to the wheelchair with min assist at completion of last set with return to the room.  He was left up in the wheelchair with call button and phone in reach and safety belt in place.    Therapy Documentation Precautions:  Precautions Precautions: Fall Precaution Booklet Issued: Yes (comment) Precaution Comments: monitor for orthostatic hypotension Restrictions Weight Bearing Restrictions:  No RUE Weight Bearing: Weight bearing as tolerated RUE Partial Weight Bearing Percentage or Pounds: No restrictions Other Position/Activity Restrictions: no longer has sternal precautions or UE PWB precautions - confirmed with Dr. Dagoberto Ligas on 4/9  Pain: Pain Assessment Pain Scale: Faces Pain Score: 0-No pain ADL: See Care Tool Section for some details of mobility and selfcare  Therapy/Group: Individual Therapy  Emmalou Hunger OTR/L 06/02/2019, 8:59 AM

## 2019-06-02 NOTE — Plan of Care (Signed)
  Problem: Consults Goal: RH GENERAL PATIENT EDUCATION Description: See Patient Education module for education specifics. Outcome: Progressing Goal: Skin Care Protocol Initiated - if Braden Score 18 or less Description: If consults are not indicated, leave blank or document N/A Outcome: Progressing Goal: Nutrition Consult-if indicated Outcome: Progressing Goal: Diabetes Guidelines if Diabetic/Glucose > 140 Description: If diabetic or lab glucose is > 140 mg/dl - Initiate Diabetes/Hyperglycemia Guidelines & Document Interventions  Outcome: Progressing   Problem: RH BOWEL ELIMINATION Goal: RH STG MANAGE BOWEL WITH ASSISTANCE Description: STG Manage Bowel with min Assistance. Outcome: Progressing Goal: RH STG MANAGE BOWEL W/MEDICATION W/ASSISTANCE Description: STG Manage Bowel with Medication with min Assistance. Outcome: Progressing   Problem: RH BLADDER ELIMINATION Goal: RH STG MANAGE BLADDER WITH ASSISTANCE Description: STG Manage Bladder With min/mod Assistance Outcome: Progressing   Problem: RH SKIN INTEGRITY Goal: RH STG SKIN FREE OF INFECTION/BREAKDOWN Description: Patients skin will remain free from FURTHER breakdown or infection with min/mod assist. Outcome: Progressing Goal: RH STG MAINTAIN SKIN INTEGRITY WITH ASSISTANCE Description: STG Maintain Skin Integrity With min/mod Assistance. Outcome: Progressing Goal: RH STG ABLE TO PERFORM INCISION/WOUND CARE W/ASSISTANCE Description: STG Able To Perform Incision/Wound Care With total assistance from caregiver. Outcome: Progressing   Problem: RH SAFETY Goal: RH STG ADHERE TO SAFETY PRECAUTIONS W/ASSISTANCE/DEVICE Description: STG Adhere to Safety Precautions With min Assistance/Device. Outcome: Progressing   Problem: RH PAIN MANAGEMENT Goal: RH STG PAIN MANAGED AT OR BELOW PT'S PAIN GOAL Description: < 4 Outcome: Progressing

## 2019-06-03 ENCOUNTER — Inpatient Hospital Stay (HOSPITAL_COMMUNITY): Payer: Medicare Other

## 2019-06-03 LAB — GLUCOSE, CAPILLARY
Glucose-Capillary: 107 mg/dL — ABNORMAL HIGH (ref 70–99)
Glucose-Capillary: 123 mg/dL — ABNORMAL HIGH (ref 70–99)
Glucose-Capillary: 110 mg/dL — ABNORMAL HIGH (ref 70–99)
Glucose-Capillary: 147 mg/dL — ABNORMAL HIGH (ref 70–99)

## 2019-06-03 MED ORDER — ONDANSETRON HCL 4 MG PO TABS
4.0000 mg | ORAL_TABLET | Freq: Three times a day (TID) | ORAL | Status: DC | PRN
  Administered 2019-06-05 – 2019-06-07 (×2): 4 mg via ORAL
  Filled 2019-06-03 (×2): qty 1

## 2019-06-03 NOTE — Progress Notes (Signed)
[place] PHYSICAL MEDICINE & REHABILITATION PROGRESS NOTE  Subjective/Complaints:  Pt reports he's waiting to receive breakfast this AM- saw ~ 9am.  Slept well/OK.  No issues this AM  ROS:   Pt denies SOB, abd pain, CP, N/V/C/D, and vision changes  Objective:   No results found. Recent Labs    06/01/19 0738  WBC 6.6  HGB 8.7*  HCT 28.1*  PLT 157   Recent Labs    06/01/19 1151  NA 134*  K 3.8  CL 97*  CO2 26  GLUCOSE 123*  BUN 46*  CREATININE 5.72*  CALCIUM 8.0*    Intake/Output Summary (Last 24 hours) at 06/03/2019 1236 Last data filed at 06/03/2019 1048 Gross per 24 hour  Intake 250 ml  Output --  Net 250 ml     Physical Exam: Vital Signs Blood pressure (!) 131/49, pulse 85, temperature 99.3 F (37.4 C), resp. rate 18, height 5\' 8"  (1.727 m), weight 87.2 kg, SpO2 92 %. Constitutional: No distress . Vital signs reviewed.sititng up in bed; awaiting breakfast, NAD HENT: Normocephalic.  Atraumatic.  + stoma C/D/I Eyes: conjugate gaze Cardiovascular: RRR- no JVD Respiratory: CTA b/l  + Pleurx. GI: Soft, NT, ND, (+)BS  Skin: Wounds not examined today. Psych: Fbrighter affect today Musc: Generalized edema. 2+ edema in UEs and LEs today-unchanged Neuro: Alert Motor: 4 --4/5 throughout, stable  Assessment/Plan: 1. Functional deficits secondary to debility which require 3+ hours per day of interdisciplinary therapy in a comprehensive inpatient rehab setting.  Physiatrist is providing close team supervision and 24 hour management of active medical problems listed below.  Physiatrist and rehab team continue to assess barriers to discharge/monitor patient progress toward functional and medical goals  Care Tool:  Bathing    Body parts bathed by patient: Right arm, Left arm, Chest, Abdomen, Right upper leg, Left upper leg, Face   Body parts bathed by helper: Front perineal area, Buttocks, Right lower leg, Left lower leg     Bathing assist Assist Level:  Moderate Assistance - Patient 50 - 74%     Upper Body Dressing/Undressing Upper body dressing   What is the patient wearing?: Pull over shirt    Upper body assist Assist Level: Moderate Assistance - Patient 50 - 74%    Lower Body Dressing/Undressing Lower body dressing      What is the patient wearing?: Incontinence brief, Pants     Lower body assist Assist for lower body dressing: Total Assistance - Patient < 25%     Toileting Toileting    Toileting assist Assist for toileting: Total Assistance - Patient < 25%     Transfers Chair/bed transfer  Transfers assist  Chair/bed transfer activity did not occur: Safety/medical concerns(nausea/dizziness/fatigue)  Chair/bed transfer assist level: Moderate Assistance - Patient 50 - 74% Chair/bed transfer assistive device: Programmer, multimedia   Ambulation assist   Ambulation activity did not occur: Safety/medical concerns(nausea/dizziness/fatigue)  Assist level: Minimal Assistance - Patient > 75% Assistive device: Walker-rolling Max distance: 20 ft   Walk 10 feet activity   Assist  Walk 10 feet activity did not occur: Safety/medical concerns  Assist level: Minimal Assistance - Patient > 75% Assistive device: Walker-rolling   Walk 50 feet activity   Assist Walk 50 feet with 2 turns activity did not occur: Safety/medical concerns         Walk 150 feet activity   Assist Walk 150 feet activity did not occur: Safety/medical concerns  Walk 10 feet on uneven surface  activity   Assist Walk 10 feet on uneven surfaces activity did not occur: Safety/medical concerns         Wheelchair     Assist Will patient use wheelchair at discharge?: No(sternal precautions) Type of Wheelchair: Manual    Wheelchair assist level: Supervision/Verbal cueing Max wheelchair distance: 50 ft    Wheelchair 50 feet with 2 turns activity    Assist        Assist Level: Supervision/Verbal  cueing   Wheelchair 150 feet activity     Assist          Blood pressure (!) 131/49, pulse 85, temperature 99.3 F (37.4 C), resp. rate 18, height 5\' 8"  (1.727 m), weight 87.2 kg, SpO2 92 %.  Medical Problem List and Plan: 1.Debilitysecondary to CAD status post CABG 03/15/2019. Impella device removed 04/10/2019. Sternal precautions  Continue CIR  2. Antithrombotics: -DVT/anticoagulation:SCDs- due to hematuria -antiplatelet therapy: Aspirin 81 mg daily 3. Pain Management:Oxycodone as needed  Controlled on 4/16 4. Mood:Provide emotional support   Remeron started on 4/14 with improvement  4/18- sleeping well- eating better per pt/family -antipsychotic agents: N/A 5. Neuropsych: This patientiscapable of making decisions on hisown behalf. 6. Skin/Wound Care:Routine skin checks  PRAFO's for bilateral lower extremities 7. Fluids/Electrolytes/Nutrition:Monitor I/Os 8. Acute systolic congestive heart failure/cardiogenic shock. Follow-up cardiology services Filed Weights   06/01/19 1811 06/02/19 0323 06/03/19 0552  Weight: 85 kg 86 kg 87.2 kg   Elevated on 4/16  4/17- weight down to 86kg- down 1 kg  4/18- weight up to 87.1 kg- up 1 kg - overall,  pretty stable though 9. Acute hypoxic respiratory failure with Pseudomonas pneumonia. Status post tracheostomy 03/27/2019. Decannulated 04/28/2019.  Weaned supplemental oxygen  10.Acute on chronic anemia/thrombocytopenia. No more heparin for HD for now  Hemoglobin 8.7 on 4/16  Platelets 157 on 4/16  HIT negative 11. Dysphagia/nausea.   Advance to regular thins.  Follow-up speech therapy.Nasogastric tube/cortrakfor nutritional support.   Dietary follow-up- see dietary note dated 4/6 for additional information.   -PEG not safe option as per cardiothoracic surgery team, will consider follow-up with CTS if necessary.   4/9- Added Phenergan 12.5 mg Q6 hours prn for nausea since zofran doesn't  workAdded scopolamine patch for nausea and dizziness.   KUB personally reviewed, bowel gas pattern.    Encourage patient to have son bring in food that patient enjoys and have it modified, reminded again  ECG reviewed, borderline QTC-trial low-dose Reglan with meals on 4/14, with improvement  Remeron started on 4/14  CMP not completed, ordered again  Improving oral intake  4/18- pt and family report eating much improved- cortrak out.  12. 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 Patient has been transitioned to dialysis and being sent to OP HD after CIR, so is chronicand followed by renal services. Tunneled L IJcatheter placed 04/24/2019, exchanged 13. Hematuria. Cystoscopy completed with evacuation of clot. No further bleeding episodes per notes, intermittent bleeding.   Continue to monitor 14. Left pleural effusion. Status post Pleurx catheter 04/23/2019.PATIENT WILL KEEP PLEURX CATHETER FOR NOW PER CVTS.Currently being drained Mondaysand Thursdays 15. Diffuse drug rashversus contact dermatitis. Appears to be resolving. Steroid taper completed   Benadryl prn, Sarna lotion, hypoallergenic sheets.   Improving 16.Rectal bleeding. 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 Follow-up GI with conservative care no further bleeding reported. 17. PAF. Patient remains normal sinus rhythm. Currently off amiodarone. No plan to resume any anticoagulation  at this time until hematuria fully resolved.  Heart rate controlled on 4/16  4/18- in RRR_ rate 85- con't meds/regimen 18.  Prediabetes  Monitor with increased mobility   Labs pending 19.  Leukocytosis: Resolved  WBCs 6.6 on 4/16  Afebrile 20.  Severe hypoalbuminemia  Supplement initiated, stressed importance  LOS: 12 days A FACE TO FACE EVALUATION WAS PERFORMED  Srinika Delone 06/03/2019, 12:36 PM

## 2019-06-03 NOTE — Progress Notes (Signed)
Admit: 05/22/2019 LOS: 12  89M ESRD after cardiogenic shock; CAD s/p CABG  Subjective:  . No c/o at current time   04/17 0701 - 04/18 0700 In: 130 [P.O.:120; I.V.:10] Out: -   Filed Weights   06/01/19 1811 06/02/19 0323 06/03/19 0552  Weight: 85 kg 86 kg 87.2 kg    Scheduled Meds: . aspirin  81 mg Oral Daily  . Chlorhexidine Gluconate Cloth  6 each Topical Q0600  . famotidine  10 mg Oral Daily  . feeding supplement (PRO-STAT SUGAR FREE 64)  30 mL Oral BID  . Gerhardt's butt cream   Topical BID  . hydrocortisone   Rectal BID  . hydrocortisone  25 mg Rectal BID  . hydrocortisone cream   Topical BID  . insulin aspart  0-15 Units Subcutaneous TID WC  . metoCLOPramide  5 mg Oral TID AC & HS  . mirtazapine  7.5 mg Oral QHS  . multivitamin  1 tablet Oral QHS  . ondansetron  4 mg Oral Once  . scopolamine  1 patch Transdermal Q72H  . simethicone  80 mg Oral TID  . sodium chloride flush  10-40 mL Intracatheter Q12H   Continuous Infusions: . anticoagulant sodium citrate     PRN Meds:.oxyCODONE **AND** acetaminophen, camphor-menthol, diphenhydrAMINE, lidocaine, ondansetron, promethazine, sodium chloride, sodium chloride flush  Current Labs: reviewed    Physical Exam:  Blood pressure (!) 131/49, pulse 85, temperature 99.3 F (37.4 C), resp. rate 18, height 5\' 8"  (1.727 m), weight 87.2 kg, SpO2 92 %. NAD in chair RRR nl s1s2 CTAB L IJ TDC No rashes NCAT EOMI   A 1. ESRD, new during this admission; AKI from #2; 2. S/p cardiogenic shock; CAD s/p CABG 3. Debility in CIR 4. 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 Next HD 4/19: TDC, 3.5h, 3K Bath, 1-2L UF, 400/800 . Medication Issues; o Preferred narcotic agents for pain control are hydromorphone, fentanyl, and methadone. Morphine should not be used.  o Baclofen should be avoided o Avoid oral sodium phosphate and magnesium citrate based  laxatives / bowel preps    Pearson Grippe MD 06/03/2019, 1:35 PM  Recent Labs  Lab 05/28/19 1320 06/01/19 1151  NA 122* 134*  K 3.9 3.8  CL 86* 97*  CO2 26 26  GLUCOSE 122* 123*  BUN 78* 46*  CREATININE 5.37* 5.72*  CALCIUM 7.8* 8.0*  PHOS 2.4*  --    Recent Labs  Lab 05/28/19 1320 06/01/19 0738  WBC 10.8* 6.6  NEUTROABS  --  4.1  HGB 9.3* 8.7*  HCT 30.0* 28.1*  MCV 91.7 92.1  PLT 135* 157

## 2019-06-03 NOTE — Progress Notes (Addendum)
Occupational Therapy Session Note  Patient Details  Name: Jesse Murphy MRN: [id number] Date of Birth: 10-29-1928  Today's Date: 06/03/2019 OT Individual Time: 0130-0230 OT Individual Time Calculation (min): 60 min    Short Term Goals: Week 2:  OT Short Term Goal 1 (Week 2): Pt will rise to stand with mod A of 1 to RW to prepare for LB self care. OT Short Term Goal 2 (Week 2): Pt will complete a stand pivot to [facility] with RW with min A. OT Short Term Goal 3 (Week 2): Pt will be able to don a shirt with set up. OT Short Term Goal 4 (Week 2): Pt will demonstrate improved activity tolerance to stay up in his wc for at least 1 hour at a time.  Skilled Therapeutic Interventions/Progress Updates:  Pt recived seated in w/c with son present upon OTA arrival agreeable to OT intervention. Pt transported to therapy gym with total A for time mgmt. Pt complete BUE therex on SciFit ( level 1.7; 5 mins total). O2 checked at 3 mins O2 95% on RA and HR 83 bpm; O2 94% and HR 83 bpm at end of task. Pt completed seated toe taps on cones as precursor to higher level functional mobility. Pt only able to tolerate 57 seconds of seated  activity before needing rest break. Increased challenge and instructed pt to complete task in standing. Pt complete sit<>stand with MODA however reporting nausea needing to sit. Second trial pt sit<>stand with MODA and able to complete 1 toe tap on each cone and reports feeling dizzy; returned pt to room to check vitals. BP 127/53 HR 82 bpm. Pt complete sit<>stand with stedy needing MOD A +2 for sit<>stand; MOD A for bed mobility to return BLEs to supine; total A to doff shoes. Pt c/o nausea at end of session with RN providing nausea meds; pt additionally stating feeling like he may be getting a cold d/t excessive drainage from nose. Encouraged pt to let MD know during next rounding.  Pt left supine in bed with bed alarm activated and all needs within reach.   Therapy  Documentation Precautions:  Precautions Precautions: Fall Precaution Booklet Issued: Yes (comment) Precaution Comments: monitor for orthostatic hypotension Restrictions Weight Bearing Restrictions: No RUE Weight Bearing: Weight bearing as tolerated RUE Partial Weight Bearing Percentage or Pounds: No restrictions Other Position/Activity Restrictions: no longer has sternal precautions or UE PWB precautions - confirmed with Dr. Dagoberto Ligas on 4/9 General:   Vital Signs: Therapy Vitals Temp: 97.8 F (36.6 C) Temp Source: Oral Pulse Rate: 79 Resp: 16 BP: (!) 143/51 Patient Position (if appropriate): Lying Oxygen Therapy SpO2: 100 % O2 Device: Room Air Pain: Pt reports pain in BLEs during sit<>stands; provided increased rest breaks and repositioning as pain mgmt strategy.   Therapy/Group: Individual Therapy  Ihor Gully 06/03/2019, 3:12 PM

## 2019-06-03 NOTE — Progress Notes (Signed)
Physical Therapy Session Note  Patient Details  Name: Jesse Murphy MRN: [id number] Date of Birth: Jan 31, 1929  Today's Date: 06/03/2019 PT Individual Time: 8295-6213 PT Individual Time Calculation (min): 70 min    Short Term Goals: Week 2:  PT Short Term Goal 1 (Week 2): Pt will transfer bed<>chair min assist and LRAD PT Short Term Goal 2 (Week 2): Pt will perform sit<>stand with min assist and RW PT Short Term Goal 3 (Week 2): Pt will ambulate x 20 ft with mod assist and LRAD  Skilled Therapeutic Interventions/Progress Updates:    Pt in bed upon PT arrival, agreeable to therapy tx and denies pain. Therapist donned teds and shoes total assist. Pt transferred to sitting EOB with min assist, cues for techniques. Stand pivot to the w/c with mod assist and pt transported to the gym. Pt requiring seated therapeutic rest breaks throughout the session secondary to fatigue and poor cardiovascular endurance. Pt ambulated x 15 ft, x 20 ft, and x 18 ft. Pt seated in w/c performed x 20 LAQ and seated marches this session for strength/endurance. Pt worked on standing activity tolerance and balance to perform toe taps on aerobic step with UE support on RW x 10 taps per foot - x 3 trials with min assist. Pt worked on standing balance with single UE support to perform bean bag toss activity x 2 trials with CGA. Throughout the session pt performed x 8 sit<>stands from various surface heights, requiring mod--max assist to boost up to standing with RW, cues for techniques.  Pt transported to the dayroom and used kinetron 3 bouts x 1 min for LE strengthening and endurance. Pt transported back to room and left in w/c with needs in reach, chair alarm set and son present.    Therapy Documentation Precautions:  Precautions Precautions: Fall Precaution Booklet Issued: Yes (comment) Precaution Comments: monitor for orthostatic hypotension Restrictions Weight Bearing Restrictions: No RUE Weight Bearing: Weight  bearing as tolerated RUE Partial Weight Bearing Percentage or Pounds: No restrictions Other Position/Activity Restrictions: no longer has sternal precautions or UE PWB precautions - confirmed with Dr. Dagoberto Ligas on 4/9    Therapy/Group: Individual Therapy  Netta Corrigan, PT, DPT, CSRS 06/03/2019, 7:57 AM

## 2019-06-03 NOTE — Plan of Care (Signed)
  Problem: Consults Goal: RH GENERAL PATIENT EDUCATION Description: See Patient Education module for education specifics. Outcome: Progressing Goal: Skin Care Protocol Initiated - if Braden Score 18 or less Description: If consults are not indicated, leave blank or document N/A Outcome: Progressing Goal: Nutrition Consult-if indicated Outcome: Progressing Goal: Diabetes Guidelines if Diabetic/Glucose > 140 Description: If diabetic or lab glucose is > 140 mg/dl - Initiate Diabetes/Hyperglycemia Guidelines & Document Interventions  Outcome: Progressing   Problem: RH BOWEL ELIMINATION Goal: RH STG MANAGE BOWEL WITH ASSISTANCE Description: STG Manage Bowel with min Assistance. Outcome: Progressing Goal: RH STG MANAGE BOWEL W/MEDICATION W/ASSISTANCE Description: STG Manage Bowel with Medication with min Assistance. Outcome: Progressing   Problem: RH BLADDER ELIMINATION Goal: RH STG MANAGE BLADDER WITH ASSISTANCE Description: STG Manage Bladder With min/mod Assistance Outcome: Progressing   Problem: RH SKIN INTEGRITY Goal: RH STG SKIN FREE OF INFECTION/BREAKDOWN Description: Patients skin will remain free from FURTHER breakdown or infection with min/mod assist. Outcome: Progressing Goal: RH STG MAINTAIN SKIN INTEGRITY WITH ASSISTANCE Description: STG Maintain Skin Integrity With min/mod Assistance. Outcome: Progressing Goal: RH STG ABLE TO PERFORM INCISION/WOUND CARE W/ASSISTANCE Description: STG Able To Perform Incision/Wound Care With total assistance from caregiver. Outcome: Progressing   Problem: RH SAFETY Goal: RH STG ADHERE TO SAFETY PRECAUTIONS W/ASSISTANCE/DEVICE Description: STG Adhere to Safety Precautions With min Assistance/Device. Outcome: Progressing   Problem: RH PAIN MANAGEMENT Goal: RH STG PAIN MANAGED AT OR BELOW PT'S PAIN GOAL Description: < 4 Outcome: Progressing

## 2019-06-04 ENCOUNTER — Inpatient Hospital Stay (HOSPITAL_COMMUNITY): Payer: Medicare Other | Admitting: Speech Pathology

## 2019-06-04 ENCOUNTER — Inpatient Hospital Stay (HOSPITAL_COMMUNITY): Payer: Medicare Other

## 2019-06-04 ENCOUNTER — Inpatient Hospital Stay (HOSPITAL_COMMUNITY): Payer: Medicare Other | Admitting: Occupational Therapy

## 2019-06-04 DIAGNOSIS — R5381 Other malaise: Secondary | ICD-10-CM | POA: Diagnosis not present

## 2019-06-04 LAB — GLUCOSE, CAPILLARY
Glucose-Capillary: 106 mg/dL — ABNORMAL HIGH (ref 70–99)
Glucose-Capillary: 112 mg/dL — ABNORMAL HIGH (ref 70–99)
Glucose-Capillary: 90 mg/dL (ref 70–99)

## 2019-06-04 MED ORDER — HEPARIN SODIUM (PORCINE) 1000 UNIT/ML IJ SOLN
INTRAMUSCULAR | Status: AC
  Filled 2019-06-04: qty 4

## 2019-06-04 MED ORDER — ALTEPLASE 2 MG IJ SOLR
INTRAMUSCULAR | Status: AC
  Administered 2019-06-04: 4 mg
  Filled 2019-06-04: qty 4

## 2019-06-04 NOTE — Plan of Care (Signed)
  Problem: Consults Goal: RH GENERAL PATIENT EDUCATION Description: See Patient Education module for education specifics. Outcome: Progressing Goal: Skin Care Protocol Initiated - if Braden Score 18 or less Description: If consults are not indicated, leave blank or document N/A Outcome: Progressing Goal: Nutrition Consult-if indicated Outcome: Progressing Goal: Diabetes Guidelines if Diabetic/Glucose > 140 Description: If diabetic or lab glucose is > 140 mg/dl - Initiate Diabetes/Hyperglycemia Guidelines & Document Interventions  Outcome: Progressing   Problem: RH BOWEL ELIMINATION Goal: RH STG MANAGE BOWEL WITH ASSISTANCE Description: STG Manage Bowel with min Assistance. Outcome: Progressing Goal: RH STG MANAGE BOWEL W/MEDICATION W/ASSISTANCE Description: STG Manage Bowel with Medication with min Assistance. Outcome: Progressing   Problem: RH BLADDER ELIMINATION Goal: RH STG MANAGE BLADDER WITH ASSISTANCE Description: STG Manage Bladder With min/mod Assistance Outcome: Progressing   Problem: RH SKIN INTEGRITY Goal: RH STG SKIN FREE OF INFECTION/BREAKDOWN Description: Patients skin will remain free from FURTHER breakdown or infection with min/mod assist. Outcome: Progressing Goal: RH STG MAINTAIN SKIN INTEGRITY WITH ASSISTANCE Description: STG Maintain Skin Integrity With min/mod Assistance. Outcome: Progressing Goal: RH STG ABLE TO PERFORM INCISION/WOUND CARE W/ASSISTANCE Description: STG Able To Perform Incision/Wound Care With total assistance from caregiver. Outcome: Progressing   Problem: RH SAFETY Goal: RH STG ADHERE TO SAFETY PRECAUTIONS W/ASSISTANCE/DEVICE Description: STG Adhere to Safety Precautions With min Assistance/Device. Outcome: Progressing   Problem: RH PAIN MANAGEMENT Goal: RH STG PAIN MANAGED AT OR BELOW PT'S PAIN GOAL Description: < 4 Outcome: Progressing

## 2019-06-04 NOTE — Progress Notes (Signed)
Occupational Therapy Session Note  Patient Details  Name: Jesse Murphy MRN: [id number] Date of Birth: 1928-11-27  Today's Date: 06/04/2019 OT Individual Time: 1115-1200 OT Individual Time Calculation (min): 45 min    Short Term Goals: Week 1:  OT Short Term Goal 1 (Week 1): Pt will be able to rise to stand with min - mod A to prep for clothing management with toileting. OT Short Term Goal 1 - Progress (Week 1): Progressing toward goal OT Short Term Goal 2 (Week 1): Pt will be able to squat pivot to toilet or BSC with mod- max A. OT Short Term Goal 2 - Progress (Week 1): Progressing toward goal OT Short Term Goal 3 (Week 1): Pt will don shirt with set up. OT Short Term Goal 3 - Progress (Week 1): Progressing toward goal OT Short Term Goal 4 (Week 1): Pt will don pants with mod-max A. OT Short Term Goal 4 - Progress (Week 1): Progressing toward goal   Week 2:  OT Short Term Goal 1 (Week 2): Pt will rise to stand with mod A of 1 to RW to prepare for LB self care. OT Short Term Goal 2 (Week 2): Pt will complete a stand pivot to [facility] with RW with min A. OT Short Term Goal 3 (Week 2): Pt will be able to don a shirt with set up. OT Short Term Goal 4 (Week 2): Pt will demonstrate improved activity tolerance to stay up in his wc for at least 1 hour at a time.  Skilled Therapeutic Interventions/Progress Updates:    Pt received in recliner and agreeable to working on sit to stands with RW and going to ADL apt to practice tub bench transfers.  Attempted numerous times to have pt sit to stand to RW trying various tactics with hand placement, pt was too tired and could not even push himself up.  He could rock forward to elevate hips but could not transfer hand to RW.  His NT arrived to provide +2 A.  Even with 2 people, pt was unable to push through his legs to extend his knees.  Instead did a squat pivot with mod-max A of +2 recliner to bed.   From bed in supine focused on B shoulder ROM.  B  external rotation is now functional and B shoulder PROM has improved to 110 on B shoulders and AROM to 90 which is a good improvement from admission. Soft tissue mobilization and PROM to B shoulders.  Instructed pt how to use gait belt for self stretching of shoulders for increased overhead reach.  Also worked on bridging ex from supine.    With pt sitting upright, Education with pt on use of belt for A/arom exercises for BLE for increased knee flexion, hip abd/add and dorsiflexion. Reminded pt he should wear PRAFOs nightly.  Placed sign above his bed for nursing staff.    Pt set up in bed to prepare for lunch. Alarm set and all needs met.    Therapy Documentation Precautions:  Precautions Precautions: Fall Precaution Booklet Issued: Yes (comment) Precaution Comments: monitor for orthostatic hypotension Restrictions Weight Bearing Restrictions: No RUE Weight Bearing: Weight bearing as tolerated RUE Partial Weight Bearing Percentage or Pounds: No restrictions Other Position/Activity Restrictions: no longer has sternal precautions or UE PWB precautions - confirmed with Dr. Dagoberto Ligas on 4/9  Pain: Pain Assessment Pain Scale: 0-10 Pain Score: 0-No pain ADL: ADL Upper Body Bathing: Setup Where Assessed-Upper Body Bathing: Edge of bed Lower Body Bathing:  Maximal assistance Where Assessed-Lower Body Bathing: Edge of bed Upper Body Dressing: Moderate assistance Where Assessed-Upper Body Dressing: Edge of bed Lower Body Dressing: Dependent Where Assessed-Lower Body Dressing: Edge of bed Toileting: Dependent Where Assessed-Toileting: Bedside Commode Toilet Transfer: Moderate assistance Toilet Transfer Method: Stand pivot Toilet Transfer Equipment: Bedside commode   Therapy/Group: Individual Therapy  [place] 06/04/2019, 10:37 AM

## 2019-06-04 NOTE — Progress Notes (Signed)
[facility] PHYSICAL MEDICINE & REHABILITATION PROGRESS NOTE  Subjective/Complaints: Patient seen sitting up in bed this morning.  He states he did not feel like he had a weekend because he had therapies both days.  Discussed importance of protein supplementation with patient.  He states she did not thought his PRAFOs again last night, discussed with nursing again.  ROS: Denies CP, SOB, N/V/D  Objective:   No results found. No results for input(s): WBC, HGB, HCT, PLT in the last 72 hours. No results for input(s): NA, K, CL, CO2, GLUCOSE, BUN, CREATININE, CALCIUM in the last 72 hours.  Intake/Output Summary (Last 24 hours) at 06/04/2019 1336 Last data filed at 06/04/2019 1300 Gross per 24 hour  Intake 240 ml  Output --  Net 240 ml     Physical Exam: Vital Signs Blood pressure (!) 130/53, pulse 87, temperature 99.3 F (37.4 C), temperature source Oral, resp. rate 18, height 5\' 8"  (1.727 m), weight 85.4 kg, SpO2 94 %. Constitutional: No distress . Vital signs reviewed. HENT: Normocephalic.  Atraumatic. Eyes: EOMI. No discharge. Cardiovascular: No JVD. Respiratory: Normal effort.  No stridor.  + Pleurx. GI: Non-distended. Skin: Warm and dry.  Wounds not examined today. Psych: Normal mood.  Normal behavior. Musc: Generalized edema. Neuro: Alert Motor: 4 --4/5 throughout, unchanged  Assessment/Plan: 1. Functional deficits secondary to debility which require 3+ hours per day of interdisciplinary therapy in a comprehensive inpatient rehab setting.  Physiatrist is providing close team supervision and 24 hour management of active medical problems listed below.  Physiatrist and rehab team continue to assess barriers to discharge/monitor patient progress toward functional and medical goals  Care Tool:  Bathing    Body parts bathed by patient: Right arm, Left arm, Chest, Abdomen, Right upper leg, Left upper leg, Face   Body parts bathed by helper: Front perineal area, Buttocks,  Right lower leg, Left lower leg     Bathing assist Assist Level: Moderate Assistance - Patient 50 - 74%     Upper Body Dressing/Undressing Upper body dressing   What is the patient wearing?: Button up shirt(zip up jacket)    Upper body assist Assist Level: Moderate Assistance - Patient 50 - 74%    Lower Body Dressing/Undressing Lower body dressing      What is the patient wearing?: Incontinence brief, Pants     Lower body assist Assist for lower body dressing: Total Assistance - Patient < 25%     Toileting Toileting    Toileting assist Assist for toileting: Total Assistance - Patient < 25%     Transfers Chair/bed transfer  Transfers assist  Chair/bed transfer activity did not occur: Safety/medical concerns(nausea/dizziness/fatigue)  Chair/bed transfer assist level: Moderate Assistance - Patient 50 - 74% Chair/bed transfer assistive device: Programmer, multimedia   Ambulation assist   Ambulation activity did not occur: Safety/medical concerns(nausea/dizziness/fatigue)  Assist level: Minimal Assistance - Patient > 75% Assistive device: Walker-rolling Max distance: 42 ft   Walk 10 feet activity   Assist  Walk 10 feet activity did not occur: Safety/medical concerns  Assist level: Minimal Assistance - Patient > 75% Assistive device: Walker-rolling   Walk 50 feet activity   Assist Walk 50 feet with 2 turns activity did not occur: Safety/medical concerns         Walk 150 feet activity   Assist Walk 150 feet activity did not occur: Safety/medical concerns         Walk 10 feet on uneven surface  activity  Assist Walk 10 feet on uneven surfaces activity did not occur: Safety/medical concerns         Wheelchair     Assist Will patient use wheelchair at discharge?: No(sternal precautions) Type of Wheelchair: Manual    Wheelchair assist level: Supervision/Verbal cueing Max wheelchair distance: 50 ft    Wheelchair 50 feet  with 2 turns activity    Assist        Assist Level: Supervision/Verbal cueing   Wheelchair 150 feet activity     Assist          Blood pressure (!) 130/53, pulse 87, temperature 99.3 F (37.4 C), temperature source Oral, resp. rate 18, height 5\' 8"  (1.727 m), weight 85.4 kg, SpO2 94 %.  Medical Problem List and Plan: 1.Debilitysecondary to CAD status post CABG 03/15/2019. Impella device removed 04/10/2019. Sternal precautions  Continue CIR  2. Antithrombotics: -DVT/anticoagulation:SCDs- due to hematuria -antiplatelet therapy: Aspirin 81 mg daily 3. Pain Management:Oxycodone as needed  Controlled on 4/16 4. Mood:Provide emotional support   Remeron started on 4/14 with improvement -antipsychotic agents: N/A 5. Neuropsych: This patientiscapable of making decisions on hisown behalf. 6. Skin/Wound Care:Routine skin checks  PRAFO's for bilateral lower extremities, discussed with nursing again 7. Fluids/Electrolytes/Nutrition:Monitor I/Os 8. Acute systolic congestive heart failure/cardiogenic shock. Follow-up cardiology services Filed Weights   06/02/19 0323 06/03/19 0552 06/04/19 0342  Weight: 86 kg 87.2 kg 85.4 kg   Improving on 4/19 9. Acute hypoxic respiratory failure with Pseudomonas pneumonia. Status post tracheostomy 03/27/2019. Decannulated 04/28/2019.  Weaned supplemental oxygen  10.Acute on chronic anemia/thrombocytopenia. No more heparin for HD for now  Hemoglobin 8.7 on 4/16  Platelets 157 on 4/16  HIT negative 11. Dysphagia/nausea.   Advance to regular thins.  Follow-up speech therapy.Nasogastric tube/cortrakfor nutritional support.   Dietary follow-up- see dietary note dated 4/6 for additional information.   -PEG not safe option as per cardiothoracic surgery team, will consider follow-up with CTS if necessary.   4/9- Added Phenergan 12.5 mg Q6 hours prn for nausea since zofran doesn't workAdded scopolamine  patch for nausea and dizziness.   KUB personally reviewed, bowel gas pattern.    Encourage patient to have son bring in food that patient enjoys and have it modified, reminded again  ECG reviewed, borderline QTC-trial low-dose Reglan with meals on 4/14, with improvement, DC'd on 4/19  Remeron started on 4/14  Oral intake continues to improve 12. 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 Patient has been transitioned to dialysis and being sent to OP HD after CIR, so is chronicand followed by renal services. Tunneled L IJcatheter placed 04/24/2019, exchanged 13. Hematuria. Cystoscopy completed with evacuation of clot. No further bleeding episodes per notes, intermittent bleeding.   Continue to monitor 14. Left pleural effusion. Status post Pleurx catheter 04/23/2019.PATIENT WILL KEEP PLEURX CATHETER FOR NOW PER CVTS.Currently being drained Mondaysand Thursdays 15. Diffuse drug rashversus contact dermatitis. Appears to be resolving. Steroid taper completed   Benadryl prn, Sarna lotion, hypoallergenic sheets.   Improved 16.Rectal bleeding. 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 Follow-up GI with conservative care no further bleeding reported. 17. PAF. Patient remains normal sinus rhythm. Currently off amiodarone. No plan to resume any anticoagulation at this time until hematuria fully resolved.  Heart rate controlled on 4/19 18.  Prediabetes  Monitor with increased mobility   Elevated on 4/16 19.  Leukocytosis: Resolved  Afebrile 20.  Severe hypoalbuminemia  Supplement initiated, stressed importance again  LOS: 13 days A FACE TO  FACE EVALUATION WAS PERFORMED  Lamiya Naas Lorie Phenix 06/04/2019, 1:36 PM

## 2019-06-04 NOTE — Progress Notes (Signed)
Admit: 05/22/2019 LOS: 73  74M ESRD after cardiogenic shock; CAD s/p CABG  Subjective:  . No c/o at current time   04/18 0701 - 04/19 0700 In: 360 [P.O.:360] Out: -   Filed Weights   06/02/19 0323 06/03/19 0552 06/04/19 0342  Weight: 86 kg 87.2 kg 85.4 kg    Scheduled Meds: . aspirin  81 mg Oral Daily  . Chlorhexidine Gluconate Cloth  6 each Topical Q0600  . famotidine  10 mg Oral Daily  . feeding supplement (PRO-STAT SUGAR FREE 64)  30 mL Oral BID  . Gerhardt's butt cream   Topical BID  . hydrocortisone   Rectal BID  . hydrocortisone  25 mg Rectal BID  . hydrocortisone cream   Topical BID  . insulin aspart  0-15 Units Subcutaneous TID WC  . metoCLOPramide  5 mg Oral TID AC & HS  . mirtazapine  7.5 mg Oral QHS  . multivitamin  1 tablet Oral QHS  . ondansetron  4 mg Oral Once  . scopolamine  1 patch Transdermal Q72H  . simethicone  80 mg Oral TID  . sodium chloride flush  10-40 mL Intracatheter Q12H   Continuous Infusions: . anticoagulant sodium citrate     PRN Meds:.oxyCODONE **AND** acetaminophen, camphor-menthol, diphenhydrAMINE, lidocaine, ondansetron, promethazine, sodium chloride, sodium chloride flush  Current Labs: reviewed    Physical Exam:  Blood pressure (!) 130/53, pulse 87, temperature 99.3 F (37.4 C), temperature source Oral, resp. rate 18, height 5\' 8"  (1.727 m), weight 85.4 kg, SpO2 94 %. NAD in chair RRR nl s1s2 CTAB L IJ TDC No rashes NCAT EOMI   A 1. ESRD, new during this admission; AKI from #2; 2. S/p cardiogenic shock; CAD s/p CABG 3. Debility in CIR 4. 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 Next HD 4/19 on MWF scheduel: TDC, 3.5h, 3K Bath, 1-2L UF, 400/800 . Check Fe levels with HD . Medication Issues; o Preferred narcotic agents for pain control are hydromorphone, fentanyl, and methadone. Morphine should not be used.  o Baclofen should be  avoided o Avoid oral sodium phosphate and magnesium citrate based laxatives / bowel preps    Pearson Grippe MD 06/04/2019, 12:03 PM  Recent Labs  Lab 05/28/19 1320 06/01/19 1151  NA 122* 134*  K 3.9 3.8  CL 86* 97*  CO2 26 26  GLUCOSE 122* 123*  BUN 78* 46*  CREATININE 5.37* 5.72*  CALCIUM 7.8* 8.0*  PHOS 2.4*  --    Recent Labs  Lab 05/28/19 1320 06/01/19 0738  WBC 10.8* 6.6  NEUTROABS  --  4.1  HGB 9.3* 8.7*  HCT 30.0* 28.1*  MCV 91.7 92.1  PLT 135* 157

## 2019-06-04 NOTE — Progress Notes (Addendum)
Patient ID: Jesse Murphy, male   DOB: 24-Jun-1928, 86 y.o.   MRN: [id number] f    Advanced Heart Failure Rounding Note  PCP-Cardiologist: Kate Sable, MD   Subjective:    Continues to make progress in CIR. Pleased w/ his progress. He tells me that he has been walking w/ a walker and appetite is improved. Eating more. No longer getting tube feeds.  He is currently getting HD. No cardiac complaints.     Previous diffuse skin rash has improved/ nearly resolved   Objective:   Weight Range: 85.4 kg Body mass index is 28.63 kg/m.   Vital Signs:   Temp:  [97.8 F (36.6 C)-99.3 F (37.4 C)] 99.3 F (37.4 C) (04/19 0342) Pulse Rate:  [79-87] 87 (04/19 0342) Resp:  [16-20] 18 (04/19 0342) BP: (125-143)/(51-53) 130/53 (04/19 0342) SpO2:  [94 %-100 %] 94 % (04/19 0342) Weight:  [85.4 kg] 85.4 kg (04/19 0342) Last BM Date: 06/03/19  Weight change: Filed Weights   06/02/19 0323 06/03/19 0552 06/04/19 0342  Weight: 86 kg 87.2 kg 85.4 kg    Intake/Output:   Intake/Output Summary (Last 24 hours) at 06/04/2019 1352 Last data filed at 06/04/2019 1300 Gross per 24 hour  Intake 240 ml  Output --  Net 240 ml     Physical Exam   PHYSICAL EXAM: General:  Well appearing elderly WM. No respiratory difficulty HEENT: normal Neck: supple. no JVD. Carotids 2+ bilat; no bruits. No lymphadenopathy or thyromegaly appreciated. Cor: PMI nondisplaced. Regular rhythm, mildly tachy rate. No rubs, gallops or murmurs. Lungs: clear, no wheezing  Abdomen: soft, nontender, nondistended. No hepatosplenomegaly. No bruits or masses. Good bowel sounds. Extremities: no cyanosis, clubbing, rash, 1+ bilateral ankle edema, bilateral ted hoses  Neuro: alert & oriented x 3, cranial nerves grossly intact. moves all 4 extremities w/o difficulty. Affect pleasant.    Telemetry   N/A   Labs    CBC No results for input(s): WBC, NEUTROABS, HGB, HCT, MCV, PLT in the last 72 hours. Basic Metabolic  Panel No results for input(s): NA, K, CL, CO2, GLUCOSE, BUN, CREATININE, CALCIUM, MG, PHOS in the last 72 hours. Liver Function Tests No results for input(s): AST, ALT, ALKPHOS, BILITOT, PROT, ALBUMIN in the last 72 hours. No results for input(s): LIPASE, AMYLASE in the last 72 hours. Cardiac Enzymes No results for input(s): CKTOTAL, CKMB, CKMBINDEX, TROPONINI in the last 72 hours.  BNP: BNP (last 3 results) Recent Labs    03/22/19 0401 03/26/19 0626 03/27/19 1255  BNP 340.4* 687.5* 800.4*    ProBNP (last 3 results) No results for input(s): PROBNP in the last 8760 hours.   D-Dimer No results for input(s): DDIMER in the last 72 hours. Hemoglobin A1C No results for input(s): HGBA1C in the last 72 hours. Fasting Lipid Panel No results for input(s): CHOL, HDL, LDLCALC, TRIG, CHOLHDL, LDLDIRECT in the last 72 hours. Thyroid Function Tests No results for input(s): TSH, T4TOTAL, T3FREE, THYROIDAB in the last 72 hours.  Invalid input(s): FREET3  Other results:   Imaging    No results found.   Medications:     Scheduled Medications: . aspirin  81 mg Oral Daily  . Chlorhexidine Gluconate Cloth  6 each Topical Q0600  . famotidine  10 mg Oral Daily  . feeding supplement (PRO-STAT SUGAR FREE 64)  30 mL Oral BID  . Gerhardt's butt cream   Topical BID  . hydrocortisone   Rectal BID  . hydrocortisone  25 mg Rectal BID  .  hydrocortisone cream   Topical BID  . insulin aspart  0-15 Units Subcutaneous TID WC  . metoCLOPramide  5 mg Oral TID AC & HS  . multivitamin  1 tablet Oral QHS  . ondansetron  4 mg Oral Once  . scopolamine  1 patch Transdermal Q72H  . simethicone  80 mg Oral TID  . sodium chloride flush  10-40 mL Intracatheter Q12H    Infusions: . anticoagulant sodium citrate      PRN Medications: oxyCODONE **AND** acetaminophen, camphor-menthol, diphenhydrAMINE, lidocaine, ondansetron, promethazine, sodium chloride, sodium chloride flush    Assessment/Plan    1. Acute systolic HF -> Cardiogenic shock - post-op echo on 03/20/19 EF 25-30% (pre-op 30-35%. In 12/2017 EF normal) - Required Impella support post CABG. Impella removed 2/23. Post-op TEE EF 40% - Has developed ESRD. Volume controlled through HD - Volume stable. Continue HD per nephrology, MWF   - previously had been unable to tolerate GDMT with low BP and ESRD. BP improved, may be able to add low dose  blocker soon   2. CAD s/p CABG this admit (LIMA->LAD, SVG->OM, SVG->RCA on 03/15/19) - No s/s ischemia - Continue statin.  - Off ASA due to recent issues w/ hematuria and BRBPR  3. Acute hypoxic respiratory failure with Pseudomonas PNA - s/p trach on 03/27/19 - completed meropenem for pseudomonas PNA - trach aspirate 3/5 w/ regrowth of Pseudomonas again.   - Completed antibiotic course.  - Trach decannulated 3/13. Stable on [place] - Stable no change   4. PAF - s/p MAZE and LAA occlusion - RRR on exam. Off amio due to rash (unclear which agent it was) - Can reconsider AC in near future as bladder issues stabilize. ? Adding apixaban 2.5 mg bid  5. AKI -> ESRD - due to shock/ATN.  - Tunneled cath placed 3/9. Clotted 4/8 s/p exchange by IR - He is tolerating iHD. Midodrine now off.  - Vein mapping complete. Nephrology considering permanent access but no formal plans   6. Hematuria -Cystoscopy 3/15 with fulgeration - Back to OR 3/28 with fulgeration and formalin installation - monitor closely for recurrence. Hgb 8.7 on CBC 4/16  7. Complete heart block with profound symptomatic bradycardia - likely vagal in nature - s/p emergent TVP 3/25 - resolved TVP out.   8. Debility, severe - improving w/ CIR. Appreciate their assistance   9. F/E/N - PO intake has improved, no longer requiring TFs     10. Left Pleural Effusion  - moderate to large. - s/p PleurX on 3/8.  - TCTS managing. Draining M/Th  10. Rash - Initially w/ diffuse rash on extremities and torso w/ appearance of drug  induced rash. - Steroids started 3/21 w/ improvement/ resolution. Now off prednisone - redeveloped diffuse rash 4/6 after addition of Reglan and atorvastatin. Both agents discontinued. Appears to be slowly improving.     11. Anemia: - hgb 9.0 -> 7.9 -> 10.9 -> 11.2->11.4->10.7 -> 11.0- >10.5->->9.3->8.7  12. Dysphagia and BRBPR - ? Possible esophagitis +/- radiation proctitis - GI has seen.Treated with IV fluconazole.  - Resolved  13. Hyperkalemia - resolved. K has been stable on HD   Lyda Jester, PA-C  1:52 PM    Patient seen and examined with the above-signed Advanced Practice Provider and/or Housestaff. I personally reviewed laboratory data, imaging studies and relevant notes. I independently examined the patient and formulated the important aspects of the plan. I have edited the note to reflect any of my changes or salient  points. I have personally discussed the plan with the patient and/or family.  Patient seen on HD. He looks great. Says he is feeling much stronger. Appetite improved. Walking with walker,. SBP in 130-140s on non-HD days and off midodrine. BSBP dropping to 90s today on HD. Volume status well controlled. Weight 184. No CP or SOB. No further hematuria.   General: Lying in bed on HD. Looks younger than stated age No resp difficulty HEENT: normal Neck: supple. no JVD. Carotids 2+ bilat; no bruits. No lymphadenopathy or thryomegaly appreciated. + HD cath Cor: PMI nondisplaced. Regular rate & rhythm. No rubs, gallops or murmurs. Lungs: clear but decreased at bases Abdomen: soft, nontender, nondistended. No hepatosplenomegaly. No bruits or masses. Good bowel sounds. Extremities: no cyanosis, clubbing, rash, edema Neuro: alert & orientedx3, cranial nerves grossly intact. moves all 4 extremities w/o difficulty. Affect pleasant  He is much improved. Stable from CAD and HF perspective. Seems to be still in NSR on exam. Will get ECG in am, Now that hematuria has  resolved can reconsider low-dose apixaban for PAF if it recurs.   Glori Bickers, MD  8:39 PM

## 2019-06-04 NOTE — Progress Notes (Signed)
Physical Therapy Session Note  Patient Details  Name: Jesse Murphy MRN: [id number] Date of Birth: 1929-02-04  Today's Date: 06/04/2019 PT Individual Time: 6681-5947 PT Individual Time Calculation (min): 40 min    Short Term Goals: Week 2:  PT Short Term Goal 1 (Week 2): Pt will transfer bed<>chair min assist and LRAD PT Short Term Goal 2 (Week 2): Pt will perform sit<>stand with min assist and RW PT Short Term Goal 3 (Week 2): Pt will ambulate x 20 ft with mod assist and LRAD  Skilled Therapeutic Interventions/Progress Updates:    Pt supine in bed upon PT arrival, agreeable to therapy tx and denies pain. Therapist donned teds and shoes total assist. Pt transferred to sitting with min assist and use of bedrail, reports some dizziness upon sitting. Pt sits EOB x 17min until dizziness resolves. Pt transferred to w/c stand pivot mod assist and transported to the gym in w/c. Pt requiring seated therapeutic rest breaks throughout the session secondary to fatigue and poor cardiovascular endurance. Pt ambulated x 42 ft and x18 ft , working on endurance and gait. Pt transferred to mat with min assist, reports continued dizziness. Squat pivot to w/c mod assist. Therapist checked vitals - BP 155/66, HR 92 and SpO2 95%. Pt transported back to room at end of session. Ambulated x 10 ft to his recliner with RW and min assist. Sit<>stands throughout session continue to be mod-max assist with RW. Pt left in recliner with needs in reach and chair alarm set.   Therapy Documentation Precautions:  Precautions Precautions: Fall Precaution Booklet Issued: Yes (comment) Precaution Comments: monitor for orthostatic hypotension Restrictions Weight Bearing Restrictions: No RUE Weight Bearing: Weight bearing as tolerated RUE Partial Weight Bearing Percentage or Pounds: No restrictions Other Position/Activity Restrictions: no longer has sternal precautions or UE PWB precautions - confirmed with Dr. Dagoberto Ligas on  4/9    Therapy/Group: Individual Therapy  Netta Corrigan, PT, DPT, CSRS 06/04/2019, 7:54 AM

## 2019-06-04 NOTE — Progress Notes (Signed)
Speech Language Pathology Daily Session Note  Patient Details  Name: Jesse Murphy MRN: [id number] Date of Birth: Sep 26, 1928  Today's Date: 06/04/2019 SLP Individual Time: 7341-9379 SLP Individual Time Calculation (min): 42 min  Short Term Goals: Week 2: SLP Short Term Goal 1 (Week 2): Pt will consume current diet with minimal s/sx aspiration and efficient mastication and oral clearance with supervision cues for use of compensatory strategies SLP Short Term Goal 2 (Week 2): Pt will demonstrate selective attention to functional tasks for 10  minutes with Min A verbal/visual cues for redirection. SLP Short Term Goal 3 (Week 2): Pt will use external aids to orient to date with Min A verbal/visual cues SLP Short Term Goal 4 (Week 2): Pt will demonstrate ability to problem solve basic familiar tasks with Min A verbal/visual cues  Skilled Therapeutic Interventions: Pt was seen for skilled ST intervention targeting aforementioned goals. Pt was cooperative with unfamiliar therapist. Flat affect throughout session. SLP facilitated session by providing min visual cues for orientation to time. The SLUMS examination was given for diagnostic treatment. Pt scored 20/30, raising concern for neurocognitive deficits. Points were lost on delayed recall, mental math, digit reversal, and auditory attention and recall. Pt reports he independently managed his medications and finances prior to admit. Pt indicated he was on 6 medications prior to admit - one for blood pressure, another heart medication. He was unable to recall what the other 4 were for, and could not remember any of their names. Pt was not observed with po intake during this session. He did exhibit a nonproductive congested cough intermittently during this session, which he reports he has had for a long time.   Pt was left in recliner with alarm on, all needs within reach. Continue ST per current plan of care.   Pain Pain Assessment Pain Scale:  0-10 Pain Score: 0-No pain  Therapy/Group: Individual Therapy   Dametrius Sanjuan B. Quentin Ore, [facility], CCC-SLP Speech Language Pathologist  Shonna Chock 06/04/2019, 10:29 AM

## 2019-06-05 ENCOUNTER — Inpatient Hospital Stay (HOSPITAL_COMMUNITY): Payer: Medicare Other

## 2019-06-05 ENCOUNTER — Inpatient Hospital Stay (HOSPITAL_COMMUNITY): Payer: Medicare Other | Admitting: Occupational Therapy

## 2019-06-05 ENCOUNTER — Inpatient Hospital Stay (HOSPITAL_COMMUNITY): Payer: Medicare Other | Admitting: Physical Therapy

## 2019-06-05 ENCOUNTER — Inpatient Hospital Stay (HOSPITAL_COMMUNITY): Payer: Medicare Other | Admitting: Speech Pathology

## 2019-06-05 DIAGNOSIS — R7303 Prediabetes: Secondary | ICD-10-CM

## 2019-06-05 LAB — GLUCOSE, CAPILLARY
Glucose-Capillary: 83 mg/dL (ref 70–99)
Glucose-Capillary: 95 mg/dL (ref 70–99)
Glucose-Capillary: 121 mg/dL — ABNORMAL HIGH (ref 70–99)
Glucose-Capillary: 115 mg/dL — ABNORMAL HIGH (ref 70–99)

## 2019-06-05 MED ORDER — DARBEPOETIN ALFA 100 MCG/0.5ML IJ SOSY
100.0000 ug | PREFILLED_SYRINGE | INTRAMUSCULAR | Status: DC
  Administered 2019-06-06: 100 ug via INTRAVENOUS
  Filled 2019-06-05: qty 0.5

## 2019-06-05 MED ORDER — NEPRO/CARBSTEADY PO LIQD
237.0000 mL | Freq: Two times a day (BID) | ORAL | Status: DC
  Administered 2019-06-05 – 2019-06-12 (×4): 237 mL via ORAL

## 2019-06-05 MED ORDER — JUVEN PO PACK
1.0000 | PACK | Freq: Two times a day (BID) | ORAL | Status: DC
  Administered 2019-06-05 – 2019-06-13 (×6): 1 via ORAL
  Filled 2019-06-05 (×10): qty 1

## 2019-06-05 NOTE — Progress Notes (Signed)
Nutrition Follow-up  DOCUMENTATION CODES:   Not applicable  INTERVENTION:  -Nepro Shake po BID, each supplement provides 425 kcal and 19 grams protein (provide to pt in a styrofoam cup with ice)  -1 packet Juven BID, each packet provides 95 calories, 2.5 grams of protein (collagen), and 9.8 grams of carbohydrate (3 grams sugar); also contains 7 grams of L-arginine and L-glutamine, 300 mg vitamin C, 15 mg vitamin E, 1.2 mcg vitamin B-12, 9.5 mg zinc, 200 mg calcium, and 1.5 g  Calcium Beta-hydroxy-Beta-methylbutyrate to support wound healing  -Continue Rena-vit daily  NUTRITION DIAGNOSIS:   Increased nutrient needs related to wound healing as evidenced by estimated needs.  Ongoing.  GOAL:   Patient will meet greater than or equal to 90% of their needs  Progressing.  MONITOR:   PO intake, Supplement acceptance, Skin, I & O's, Diet advancement, Labs, Weight trends  REASON FOR ASSESSMENT:   Consult Enteral/tube feeding initiation and management  ASSESSMENT:   Patient with PMH significant for CAD s/p PCI, prostate cancer, HTN, CHF, and DM. Presented this admission with chronic a.fib with RVR. Pt admitted to CIR 05/22/19.  1/28- s/p L heart cath  1/30- s/p CABG x3, MAZE, extubated  2/2- re-intubated  2/5- extubated 2/8- re-intubated  2/10- s/p impella, trach, NGT placed-start trickle feeding 2/11- start CRRT 2/23- removal of impella, [name] dislodged  2/24- CRRT stop 3/1- failed iHD, back on CRRT 3/2- s/p [name], nocturnal feedings started 3/4- diet advanced DYS2 , nectar thick  3/8- s/p L PleurX catheter and L TDC 3/10- tolerated iHD 3/13- s/p decannulation  3/15- s/p cystoscopy  3/17- [name] clogged, s/p replacement 4/8 - [name] removed, NGT placed 4/12 - NGT removed, TF discontinued  4/16 - diet upgraded to regular with thin liquids   RD unable to reach pt via phone.   Per MD, pt is reporting his nausea has improved.   Pt noted to have refused Pro-stat.  Previously discussed pt with SLP who stated pt accepting of supplement when brought to him in a styrofoam cup (pt previously declining all supplements offered to him).  PO Intake: 0-100% x last 6 recorded meals (49% average meal intake)  Last HD 4/19 Net UF 1.7L Next HD today  Medications reviewed and include: Aranesp, Pepcid, 71ml Pro-stat BID, Novolog, Reglan, Rena-vit, Zofran  Labs reviewed: Na 134 (L), CBGs 90-83-95  Diet Order:   Diet Order            Diet regular Room service appropriate? Yes; Fluid consistency: Thin  Diet effective 0500              EDUCATION NEEDS:   Not appropriate for education at this time  Skin:  Skin Assessment: Skin Integrity Issues: Skin Integrity Issues:: Stage II, DTI DTI: R heel Stage II: R ear, R buttocks Incisions: perineum, R leg  Last BM:  4/20  Height:   Ht Readings from Last 1 Encounters:  05/22/19 5\' 8"  (1.727 m)    Weight:   Wt Readings from Last 1 Encounters:  06/04/19 82.3 kg    BMI:  Body mass index is 27.59 kg/m.  Estimated Nutritional Needs:   Kcal:  2200-2400  Protein:  135-155 grams  Fluid:  >/= 2.2 L/d   Larkin Ina, MS, RD, LDN RD pager number and weekend/on-call pager number located in [place].

## 2019-06-05 NOTE — Progress Notes (Signed)
Admit: 05/22/2019 LOS: 52  71M ESRD after cardiogenic shock; CAD s/p CABG  Subjective:  . HD yesterday, req tPA of HD cath, worked well afterwards . 1.7L UF . Dizzy this AM, in bed  04/19 0701 - 04/20 0700 In: 240 [P.O.:240] Out: 1693   Filed Weights   06/04/19 0342 06/04/19 1615 06/04/19 2131  Weight: 85.4 kg 83.9 kg 82.3 kg    Scheduled Meds: . aspirin  81 mg Oral Daily  . Chlorhexidine Gluconate Cloth  6 each Topical Q0600  . famotidine  10 mg Oral Daily  . feeding supplement (PRO-STAT SUGAR FREE 64)  30 mL Oral BID  . Gerhardt's butt cream   Topical BID  . hydrocortisone   Rectal BID  . hydrocortisone  25 mg Rectal BID  . hydrocortisone cream   Topical BID  . insulin aspart  0-15 Units Subcutaneous TID WC  . metoCLOPramide  5 mg Oral TID AC & HS  . multivitamin  1 tablet Oral QHS  . ondansetron  4 mg Oral Once  . scopolamine  1 patch Transdermal Q72H  . simethicone  80 mg Oral TID  . sodium chloride flush  10-40 mL Intracatheter Q12H   Continuous Infusions: . anticoagulant sodium citrate     PRN Meds:.oxyCODONE **AND** acetaminophen, camphor-menthol, diphenhydrAMINE, lidocaine, ondansetron, promethazine, sodium chloride, sodium chloride flush  Current Labs: reviewed    Physical Exam:  Blood pressure (!) 119/44, pulse 81, temperature 97.7 F (36.5 C), temperature source Oral, resp. rate (!) 23, height 5\' 8"  (1.727 m), weight 82.3 kg, SpO2 95 %. NAD in bed RRR nl s1s2 CTAB L IJ TDC No rashes NCAT EOMI  A 1. ESRD, new during this admission; AKI from #2; 2. S/p cardiogenic shock; CAD s/p CABG 3. Debility in CIR 4. 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 Cont on MWF scheduel: TDC, 3.5h, 3K Bath, 1-2L UF, 400/800 . F/u Fe levels . ESA tomorrow at HD . Check Phos after holding phoslo . Medication Issues; o Preferred narcotic agents for pain control are hydromorphone, fentanyl, and methadone. Morphine  should not be used.  o Baclofen should be avoided o Avoid oral sodium phosphate and magnesium citrate based laxatives / bowel preps    Pearson Grippe MD 06/05/2019, 12:40 PM  Recent Labs  Lab 06/01/19 1151  NA 134*  K 3.8  CL 97*  CO2 26  GLUCOSE 123*  BUN 46*  CREATININE 5.72*  CALCIUM 8.0*   Recent Labs  Lab 06/01/19 0738  WBC 6.6  NEUTROABS 4.1  HGB 8.7*  HCT 28.1*  MCV 92.1  PLT 157

## 2019-06-05 NOTE — Progress Notes (Addendum)
[place] PHYSICAL MEDICINE & REHABILITATION PROGRESS NOTE  Subjective/Complaints: Patient seen laying in bed this morning. He states he slept well overnight. He notes his nausea has resolved and is nutrition has improved. He states he was able to tolerate the Reglan wean, without issue. He states he wore his PRAFOs overnight.  He was seen by heart failure yesterday, notes reviewed-considering adding beta-blocker and?  Anticoagulation.  ROS: Denies CP, SOB, N/V/D  he was seen by heart- Objective:   No results found. No results for input(s): WBC, HGB, HCT, PLT in the last 72 hours. No results for input(s): NA, K, CL, CO2, GLUCOSE, BUN, CREATININE, CALCIUM in the last 72 hours.  Intake/Output Summary (Last 24 hours) at 06/05/2019 1224 Last data filed at 06/05/2019 0830 Gross per 24 hour  Intake 240 ml  Output 1693 ml  Net -1453 ml     Physical Exam: Vital Signs Blood pressure (!) 119/44, pulse 81, temperature 97.7 F (36.5 C), temperature source Oral, resp. rate (!) 23, height 5\' 8"  (1.727 m), weight 82.3 kg, SpO2 95 %. Constitutional: No distress . Vital signs reviewed. HENT: Normocephalic.  Atraumatic. Eyes: EOMI. No discharge. Cardiovascular: No JVD. Respiratory: Normal effort.  No stridor. + Pleurx. GI: Non-distended. Skin: Warm and dry. Wounds not examined today. Psych: Generalized edema. Musc: No edema in extremities.  No tenderness in extremities. Neuro: Alert Motor: 4 --4/5 throughout, stable  Assessment/Plan: 1. Functional deficits secondary to debility which require 3+ hours per day of interdisciplinary therapy in a comprehensive inpatient rehab setting.  Physiatrist is providing close team supervision and 24 hour management of active medical problems listed below.  Physiatrist and rehab team continue to assess barriers to discharge/monitor patient progress toward functional and medical goals  Care Tool:  Bathing    Body parts bathed by patient: Right arm, Left  arm, Chest, Abdomen, Right upper leg, Left upper leg, Face   Body parts bathed by helper: Front perineal area, Buttocks, Right lower leg, Left lower leg     Bathing assist Assist Level: Moderate Assistance - Patient 50 - 74%     Upper Body Dressing/Undressing Upper body dressing   What is the patient wearing?: Button up shirt(zip up jacket)    Upper body assist Assist Level: Moderate Assistance - Patient 50 - 74%    Lower Body Dressing/Undressing Lower body dressing      What is the patient wearing?: Incontinence brief, Pants     Lower body assist Assist for lower body dressing: Total Assistance - Patient < 25%     Toileting Toileting    Toileting assist Assist for toileting: Total Assistance - Patient < 25%     Transfers Chair/bed transfer  Transfers assist  Chair/bed transfer activity did not occur: Safety/medical concerns(nausea/dizziness/fatigue)  Chair/bed transfer assist level: Moderate Assistance - Patient 50 - 74%(squat pivot) Chair/bed transfer assistive device: Programmer, multimedia   Ambulation assist   Ambulation activity did not occur: Safety/medical concerns(nausea/dizziness/fatigue)  Assist level: Minimal Assistance - Patient > 75% Assistive device: Walker-rolling Max distance: 42 ft   Walk 10 feet activity   Assist  Walk 10 feet activity did not occur: Safety/medical concerns  Assist level: Minimal Assistance - Patient > 75% Assistive device: Walker-rolling   Walk 50 feet activity   Assist Walk 50 feet with 2 turns activity did not occur: Safety/medical concerns         Walk 150 feet activity   Assist Walk 150 feet activity did not occur: Safety/medical concerns  Walk 10 feet on uneven surface  activity   Assist Walk 10 feet on uneven surfaces activity did not occur: Safety/medical concerns         Wheelchair     Assist Will patient use wheelchair at discharge?: No(sternal precautions) Type  of Wheelchair: Manual    Wheelchair assist level: Supervision/Verbal cueing Max wheelchair distance: 50 ft    Wheelchair 50 feet with 2 turns activity    Assist        Assist Level: Supervision/Verbal cueing   Wheelchair 150 feet activity     Assist          Blood pressure (!) 119/44, pulse 81, temperature 97.7 F (36.5 C), temperature source Oral, resp. rate (!) 23, height 5\' 8"  (1.727 m), weight 82.3 kg, SpO2 95 %.  Medical Problem List and Plan: 1.Debilitysecondary to CAD status post CABG 03/15/2019. Impella device removed 04/10/2019. Sternal precautions  Continue CIR  2. Antithrombotics: -DVT/anticoagulation:SCDs- due to hematuria -antiplatelet therapy: Aspirin 81 mg daily 3. Pain Management:Oxycodone as needed  Controlled on 4/16 4. Mood:Provide emotional support   Remeron started on 4/14 with improvement -antipsychotic agents: N/A 5. Neuropsych: This patientiscapable of making decisions on hisown behalf. 6. Skin/Wound Care:Routine skin checks  Continue PRAFO's for bilateral lower extremities 7. Fluids/Electrolytes/Nutrition:Monitor I/Os 8. Acute systolic congestive heart failure/cardiogenic shock. Follow-up cardiology services Filed Weights   06/04/19 0342 06/04/19 1615 06/04/19 2131  Weight: 85.4 kg 83.9 kg 82.3 kg   Improving on 4/19, no weights for today 9. Acute hypoxic respiratory failure with Pseudomonas pneumonia. Status post tracheostomy 03/27/2019. Decannulated 04/28/2019.  Weaned supplemental oxygen  10.Acute on chronic anemia/thrombocytopenia. No more heparin for HD for now  Hemoglobin 8.7 on 4/16, labs with HD for will order tomorrow  Platelets 157 on 4/16  HIT negative 11. Dysphagia/nausea.   Advance to regular thins.  Follow-up speech therapy.Nasogastric tube/cortrakfor nutritional support.   Dietary follow-up- see dietary note dated 4/6 for additional information.   -PEG not safe option as  per cardiothoracic surgery team, will consider follow-up with CTS if necessary.   4/9- Added Phenergan 12.5 mg Q6 hours prn for nausea since zofran doesn't workAdded scopolamine patch for nausea and dizziness.   KUB personally reviewed, bowel gas pattern.    Encourage patient to have son bring in food that patient enjoys and have it modified, reminded again  ECG reviewed, borderline QTC-trial low-dose Reglan with meals on 4/14, with improvement, DC'd on 4/19  Remeron started on 4/14  Oral intake continues to improve 12. 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 Patient has been transitioned to dialysis and being sent to OP HD after CIR, so is chronicand followed by renal services. Tunneled L IJcatheter placed 04/24/2019, exchanged 13. Hematuria. Cystoscopy completed with evacuation of clot. No further bleeding episodes per notes, intermittent bleeding.   Continue to monitor 14. Left pleural effusion. Status post Pleurx catheter 04/23/2019.PATIENT WILL KEEP PLEURX CATHETER FOR NOW PER CVTS.Currently being drained Mondaysand Thursdays 15. Diffuse drug rashversus contact dermatitis: Resolved  Steroid taper completed   Benadryl prn, Sarna lotion, hypoallergenic sheets.  16.Rectal bleeding. 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 Follow-up GI with conservative care no further bleeding reported. 17. PAF. Patient remains normal sinus rhythm. Currently off amiodarone. No plan to resume any anticoagulation at this time until hematuria fully resolved.  Heart rate controlled on 4/20  Monitor with increased exertion 18.  Prediabetes  Monitor with increased mobility   Controlled on 4/20 19.  Leukocytosis:  Resolved  Afebrile 20.  Severe hypoalbuminemia  Supplement initiated, stressed importance again  LOS: 14 days A FACE TO FACE EVALUATION WAS PERFORMED  Jesse Murphy Lorie Phenix 06/05/2019, 12:24 PM

## 2019-06-05 NOTE — Progress Notes (Signed)
Patient complains of headache, nausea, and dizziness when standing. Orthostatics were taken by therapy: Sitting- BP: 122/53 HR:92 Standing- BP: 108/59 HR: 101   Patient also has very thing, milky urine.   PA Dan made aware and zofran was given.

## 2019-06-05 NOTE — Plan of Care (Signed)
  Problem: Consults Goal: RH GENERAL PATIENT EDUCATION Description: See Patient Education module for education specifics. Outcome: Progressing Goal: Skin Care Protocol Initiated - if Braden Score 18 or less Description: If consults are not indicated, leave blank or document N/A Outcome: Progressing Goal: Nutrition Consult-if indicated Outcome: Progressing Goal: Diabetes Guidelines if Diabetic/Glucose > 140 Description: If diabetic or lab glucose is > 140 mg/dl - Initiate Diabetes/Hyperglycemia Guidelines & Document Interventions  Outcome: Progressing   Problem: RH BOWEL ELIMINATION Goal: RH STG MANAGE BOWEL WITH ASSISTANCE Description: STG Manage Bowel with min Assistance. Outcome: Progressing Goal: RH STG MANAGE BOWEL W/MEDICATION W/ASSISTANCE Description: STG Manage Bowel with Medication with min Assistance. Outcome: Progressing   Problem: RH BLADDER ELIMINATION Goal: RH STG MANAGE BLADDER WITH ASSISTANCE Description: STG Manage Bladder With min/mod Assistance Outcome: Progressing   Problem: RH SKIN INTEGRITY Goal: RH STG SKIN FREE OF INFECTION/BREAKDOWN Description: Patients skin will remain free from FURTHER breakdown or infection with min/mod assist. Outcome: Progressing Goal: RH STG MAINTAIN SKIN INTEGRITY WITH ASSISTANCE Description: STG Maintain Skin Integrity With min/mod Assistance. Outcome: Progressing Goal: RH STG ABLE TO PERFORM INCISION/WOUND CARE W/ASSISTANCE Description: STG Able To Perform Incision/Wound Care With total assistance from caregiver. Outcome: Progressing   Problem: RH SAFETY Goal: RH STG ADHERE TO SAFETY PRECAUTIONS W/ASSISTANCE/DEVICE Description: STG Adhere to Safety Precautions With min Assistance/Device. Outcome: Progressing   Problem: RH PAIN MANAGEMENT Goal: RH STG PAIN MANAGED AT OR BELOW PT'S PAIN GOAL Description: < 4 Outcome: Progressing

## 2019-06-05 NOTE — Progress Notes (Signed)
Physical Therapy Session Note  Patient Details  Name: Jesse Murphy MRN: [id number] Date of Birth: 1928-07-07  Today's Date: 06/05/2019 PT Individual Time: 1008-1019 PT Individual Time Calculation (min): 11 min   And Today's Date: 06/05/2019 PT Missed Time: 34 Minutes Missed Time Reason: Patient ill (Comment)(details below)  Short Term Goals: Week 2:  PT Short Term Goal 1 (Week 2): Pt will transfer bed<>chair min assist and LRAD PT Short Term Goal 2 (Week 2): Pt will perform sit<>stand with min assist and RW PT Short Term Goal 3 (Week 2): Pt will ambulate x 20 ft with mod assist and LRAD  Skilled Therapeutic Interventions/Progress Updates:    Pt received sitting in w/c appearing pale and fatigued. Pt states "I don't feel good at all" reporting he has a headache and is nauseous as well as stating when he stands up "I feel so dizzy I could just faint." Therapist assessed vitals in sitting: BP 108/57 (MAP 91), HR 92bpm, SpO2 97%. Pt requesting to return to bed. R squat pivot w/c>EOB with mod assist for lifting/pivoting hips. Doffed shoes max assist. Sit>supine via reverse logroll technique to increase pt independence with min assist for B LE management. Pt therapeutically positioned in bed with B UEs elevated and heels floating - left with needs in reach and bed alarm on. Aileen Pilot, RN notified of pt's symptoms and most recent vitals. Missed 34 minutes of skilled physical therapy.  Therapy Documentation Precautions:  Precautions Precautions: Fall Precaution Booklet Issued: Yes (comment) Precaution Comments: monitor for orthostatic hypotension Restrictions Weight Bearing Restrictions: No RUE Weight Bearing: Weight bearing as tolerated RUE Partial Weight Bearing Percentage or Pounds: No restrictions Other Position/Activity Restrictions: no longer has sternal precautions or UE PWB precautions - confirmed with Dr. Dagoberto Ligas on 4/9  Pain: No reports of pain.   Therapy/Group: Individual  Therapy  Tawana Scale, PT, DPT 06/05/2019, 7:56 AM

## 2019-06-05 NOTE — Progress Notes (Signed)
Physical Therapy Session Note  Patient Details  Name: Jesse Murphy MRN: [id number] Date of Birth: [birth year]  Today's Date: 06/05/2019 PT Individual Time: 5329-9242 PT Individual Time Calculation (min): 30 min   Short Term Goals: Week 2:  PT Short Term Goal 1 (Week 2): Pt will transfer bed<>chair min assist and LRAD PT Short Term Goal 2 (Week 2): Pt will perform sit<>stand with min assist and RW PT Short Term Goal 3 (Week 2): Pt will ambulate x 20 ft with mod assist and LRAD  Skilled Therapeutic Interventions/Progress Updates:     Pt received supine in bed and agreeable to therapy, denies pain. PT provides totalA to don TED hose and shoes. Logrolling to L with verbal cues and minA and supine to sit with modA and manual cues at trunk with cues for sequencing.   Pt attempts sit to stand from elevated EOB and is just able to clear buttocks, with PT providing CGA. PT educates on body mechanics to facilitate sit to stand and pt is able to perform on 2nd try with light modA and improved anterior weight shift/trunk lean. Stand step to RW with modA and pt verbalizing dizziness. Dizziness subsides after sitting. WC transport to gym for energy conservation.  In gym, pt attempts ambulation but becomes increasingly dizzy upon standing and is unable to take step away from [facility]. Pt several time to recover and PT takes BoP in sitting at 122/53 with HR of 92. Pt performs 2nd stand with light modA from PT. Pt verbalizes increasing dizziness upon standing and appears very tremulous, requesting to sit back down. PT supports at hips so that standing blood pressure can be taken. 108/59 with 188 HR (HR likely machine error). Pulse ox shows HR at 101 with SpO2 of 94%.   WC transport back to room. Pt left seated in WC with all needs within reach and alarm intact.  Therapy Documentation Precautions:  Precautions Precautions: Fall Precaution Booklet Issued: Yes (comment) Precaution Comments: monitor for orthostatic  hypotension Restrictions Weight Bearing Restrictions: No RUE Weight Bearing: Weight bearing as tolerated RUE Partial Weight Bearing Percentage or Pounds: No restrictions Other Position/Activity Restrictions: no longer has sternal precautions or UE PWB precautions - confirmed with Dr. Dagoberto Ligas on 4/9   Therapy/Group: Individual Therapy  Breck Coons, PT, DPT 06/05/2019, 9:48 AM

## 2019-06-05 NOTE — Progress Notes (Signed)
Occupational Therapy Session Note  Patient Details  Name: Jesse Murphy MRN: [id number] Date of Birth: [birth year]  Today's Date: 06/05/2019 OT Missed Time: 28 Minutes Missed Time Reason: Patient fatigue;Patient ill (comment)   Short Term Goals: Week 2:  OT Short Term Goal 1 (Week 2): Pt will rise to stand with mod A of 1 to RW to prepare for LB self care. OT Short Term Goal 2 (Week 2): Pt will complete a stand pivot to [facility] with RW with min A. OT Short Term Goal 3 (Week 2): Pt will be able to don a shirt with set up. OT Short Term Goal 4 (Week 2): Pt will demonstrate improved activity tolerance to stay up in his wc for at least 1 hour at a time.  Skilled Therapeutic Interventions/Progress Updates:    Pt received in bed. He stated he continued to not feel well and did not want to eat.  He declined to try to participate in any therapy today.   Therapy Documentation Precautions:  Precautions Precautions: Fall Precaution Booklet Issued: Yes (comment) Precaution Comments: monitor for orthostatic hypotension Restrictions Weight Bearing Restrictions: No RUE Weight Bearing: Weight bearing as tolerated RUE Partial Weight Bearing Percentage or Pounds: No restrictions Other Position/Activity Restrictions: no longer has sternal precautions or UE PWB precautions - confirmed with Dr. Dagoberto Ligas on 4/9 General: General PT Missed Treatment Reason: Patient ill (Comment)(details below) Vital Signs: Therapy Vitals Temp: 97.7 F (36.5 C) Temp Source: Oral Pulse Rate: 81 Resp: (!) 23 BP: (!) 119/44 Patient Position (if appropriate): Lying Oxygen Therapy SpO2: 95 % O2 Device: Room Air Pain: Pain Assessment Pain Scale: 0-10 Pain Score: 0-No pain ADL: ADL Upper Body Bathing: Setup Where Assessed-Upper Body Bathing: Edge of bed Lower Body Bathing: Maximal assistance Where Assessed-Lower Body Bathing: Edge of bed Upper Body Dressing: Moderate assistance Where Assessed-Upper Body Dressing:  Edge of bed Lower Body Dressing: Dependent Where Assessed-Lower Body Dressing: Edge of bed Toileting: Dependent Where Assessed-Toileting: Bedside Commode Toilet Transfer: Moderate assistance Toilet Transfer Method: Stand pivot Toilet Transfer Equipment: Bedside commode  Therapy/Group: Individual Therapy  Julian 06/05/2019, 12:13 PM

## 2019-06-05 NOTE — Progress Notes (Signed)
SLP Cancellation Note  Patient Details Name: Jesse Murphy MRN: [id number] DOB: 31-Oct-1928   Cancelled treatment:        Pt declined ST this date due to significant headache and dizziness. RN aware. Will continue efforts as pt tolerates.                                                                                      Lanell Dubie B. Quentin Ore, [facility], CCC-SLP Speech Language Pathologist  Shonna Chock 06/05/2019, 2:13 PM

## 2019-06-06 ENCOUNTER — Inpatient Hospital Stay (HOSPITAL_COMMUNITY): Payer: Medicare Other | Admitting: Speech Pathology

## 2019-06-06 ENCOUNTER — Inpatient Hospital Stay (HOSPITAL_COMMUNITY): Payer: Medicare Other | Admitting: Occupational Therapy

## 2019-06-06 ENCOUNTER — Inpatient Hospital Stay (HOSPITAL_COMMUNITY): Payer: Medicare Other

## 2019-06-06 LAB — GLUCOSE, CAPILLARY
Glucose-Capillary: 92 mg/dL (ref 70–99)
Glucose-Capillary: 113 mg/dL — ABNORMAL HIGH (ref 70–99)
Glucose-Capillary: 116 mg/dL — ABNORMAL HIGH (ref 70–99)
Glucose-Capillary: 105 mg/dL — ABNORMAL HIGH (ref 70–99)

## 2019-06-06 MED ORDER — ALTEPLASE 2 MG IJ SOLR
INTRAMUSCULAR | Status: AC
  Administered 2019-06-06: 4 mg
  Filled 2019-06-06: qty 4

## 2019-06-06 MED ORDER — DARBEPOETIN ALFA 100 MCG/0.5ML IJ SOSY
PREFILLED_SYRINGE | INTRAMUSCULAR | Status: AC
  Filled 2019-06-06: qty 0.5

## 2019-06-06 NOTE — Progress Notes (Signed)
Printed handout of indwelling pleural catheter home guide for pt son. Provided education in case pt is to go home with tube.

## 2019-06-06 NOTE — Progress Notes (Signed)
Pt still has PleurX tube to left chest, per notes TCTS handling drainage Monday/Thuresday. No documentation since admission to CIR on 04/06. Son Letitia Libra would like to know if drain is still needed. Will notify PA.

## 2019-06-06 NOTE — Progress Notes (Signed)
Occupational Therapy Session Note  Patient Details  Name: Jesse Murphy MRN: [id number] Date of Birth: May 22, 1928  Today's Date: 06/06/2019 OT Individual Time: 4332-9518 OT Individual Time Calculation (min): 30 min    Short Term Goals: Week 2:  OT Short Term Goal 1 (Week 2): Pt will rise to stand with mod A of 1 to RW to prepare for LB self care. OT Short Term Goal 2 (Week 2): Pt will complete a stand pivot to [facility] with RW with min A. OT Short Term Goal 3 (Week 2): Pt will be able to don a shirt with set up. OT Short Term Goal 4 (Week 2): Pt will demonstrate improved activity tolerance to stay up in his wc for at least 1 hour at a time.  Skilled Therapeutic Interventions/Progress Updates:    Pt received in bed and agreeable to therapy. Pt sat to EOB with S.  On his first attempt with sit to stand he did lean forward and lifted hips but could not extend legs. Cued pt to think of "pulling his knees up" and tightening his thighs. On 2nd attempt pt able to stand with min A to RW.  Worked on tapping foot up on 4 inch step but too high so switched it out for 1 3/4 inch. Pt alternated taps 10 x each leg then needed to rest.  Sh flexion a/arom 10 x with hands clasped. Pt only able to tolerate 85-90 degrees of sh flex in sitting due to kyphotic posture.  Postural exercises of extending spine to neutral and scapular retractions 10x.  Pt rested and repeated tap ups in standing, shoulder and posture exercises.  Moved back to supine with S.  Adjusted in bed in chair position to prepare for lunch.  Pt's son present to A patient.  Bed alarm on and all needs met.   Therapy Documentation Precautions:  Precautions Precautions: Fall Precaution Booklet Issued: Yes (comment) Precaution Comments: monitor for orthostatic hypotension Restrictions Weight Bearing Restrictions: No RUE Weight Bearing: Weight bearing as tolerated RUE Partial Weight Bearing Percentage or Pounds: No restrictions Other  Position/Activity Restrictions: no longer has sternal precautions or UE PWB precautions - confirmed with Dr. Dagoberto Ligas on 4/9 Pain: Pain Assessment Pain Scale: 0-10 Pain Score: 0-No pain ADL: ADL Upper Body Bathing: Setup Where Assessed-Upper Body Bathing: Edge of bed Lower Body Bathing: Maximal assistance Where Assessed-Lower Body Bathing: Edge of bed Upper Body Dressing: Moderate assistance Where Assessed-Upper Body Dressing: Edge of bed Lower Body Dressing: Dependent Where Assessed-Lower Body Dressing: Edge of bed Toileting: Dependent Where Assessed-Toileting: Bedside Commode Toilet Transfer: Moderate assistance Toilet Transfer Method: Stand pivot Toilet Transfer Equipment: Bedside commode   Therapy/Group: Individual Therapy  [place] 06/06/2019, 12:17 PM

## 2019-06-06 NOTE — Progress Notes (Signed)
Admit: 05/22/2019 LOS: 15  3M ESRD after cardiogenic shock; CAD s/p CABG  Subjective:  . Feels better today, working with therapy in chair  04/20 0701 - 04/21 0700 In: 240 [P.O.:240] Out: -   Filed Weights   06/04/19 0342 06/04/19 1615 06/04/19 2131  Weight: 85.4 kg 83.9 kg 82.3 kg    Scheduled Meds: . aspirin  81 mg Oral Daily  . Chlorhexidine Gluconate Cloth  6 each Topical Q0600  . darbepoetin (ARANESP) injection - DIALYSIS  100 mcg Intravenous Q Wed-HD  . famotidine  10 mg Oral Daily  . feeding supplement (NEPRO CARB STEADY)  237 mL Oral BID BM  . feeding supplement (PRO-STAT SUGAR FREE 64)  30 mL Oral BID  . Gerhardt's butt cream   Topical BID  . hydrocortisone   Rectal BID  . hydrocortisone  25 mg Rectal BID  . hydrocortisone cream   Topical BID  . insulin aspart  0-15 Units Subcutaneous TID WC  . metoCLOPramide  5 mg Oral TID AC & HS  . multivitamin  1 tablet Oral QHS  . nutrition supplement (JUVEN)  1 packet Oral BID BM  . ondansetron  4 mg Oral Once  . scopolamine  1 patch Transdermal Q72H  . simethicone  80 mg Oral TID  . sodium chloride flush  10-40 mL Intracatheter Q12H   Continuous Infusions: . anticoagulant sodium citrate     PRN Meds:.oxyCODONE **AND** acetaminophen, camphor-menthol, diphenhydrAMINE, lidocaine, ondansetron, promethazine, sodium chloride, sodium chloride flush  Current Labs: reviewed    Physical Exam:  Blood pressure 132/62, pulse 91, temperature 98.1 F (36.7 C), resp. rate 14, height 5\' 8"  (1.727 m), weight 82.3 kg, SpO2 (!) 87 %. NAD in chair RRR nl s1s2 CTAB L IJ TDC No rashes NCAT EOMI  A 1. ESRD, new during this admission; AKI from #2; 2. S/p cardiogenic shock; CAD s/p CABG 3. Debility in CIR 4. 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 Cont on MWF scheduel: TDC, 3.5h, 3K Bath, 1-2L UF, 400/800 . F/u Fe levels . ESA today at HD . Check Phos after holding  phoslo . Medication Issues; o Preferred narcotic agents for pain control are hydromorphone, fentanyl, and methadone. Morphine should not be used.  o Baclofen should be avoided o Avoid oral sodium phosphate and magnesium citrate based laxatives / bowel preps    Pearson Grippe MD 06/06/2019, 11:26 AM  Recent Labs  Lab 06/01/19 1151  NA 134*  K 3.8  CL 97*  CO2 26  GLUCOSE 123*  BUN 46*  CREATININE 5.72*  CALCIUM 8.0*   Recent Labs  Lab 06/01/19 0738  WBC 6.6  NEUTROABS 4.1  HGB 8.7*  HCT 28.1*  MCV 92.1  PLT 157

## 2019-06-06 NOTE — Progress Notes (Signed)
1409 Poor functioning LIJ unable to sustain HD treatment pt rinsed back Cath Flo Activase instilled both ports of LIJ 1.93ml each and allowed to dwell 45 minutes. Removed cath flo activase at 0158 and attempt to restart HD treatment. Ended HD treatment at 1545 total HD treatment 16 minutes. Dr Pearson Grippe notified. Cath Flo Activase instilled LIJ intracath to dwell overnight patient returned to room after report given to floor nurse.

## 2019-06-06 NOTE — Progress Notes (Signed)
Pt returned from dialysis. Port not draining and cath flow administered by dialysis and will allow to sit for 24/hr to see if becomes patent. Dialysis to attempt again tomorrow.

## 2019-06-06 NOTE — Progress Notes (Addendum)
[facility] PHYSICAL MEDICINE & REHABILITATION PROGRESS NOTE  Subjective/Complaints: Patient seen sitting up in bed this morning.  He states he slept well overnight.  He notes improvement in appetite.  He was seen by Nephro yesterday, notes reviewed- no changed.  ROS: Denies CP, SOB, N/V/D  Objective:   No results found. No results for input(s): WBC, HGB, HCT, PLT in the last 72 hours. No results for input(s): NA, K, CL, CO2, GLUCOSE, BUN, CREATININE, CALCIUM in the last 72 hours.  Intake/Output Summary (Last 24 hours) at 06/06/2019 1001 Last data filed at 06/06/2019 0841 Gross per 24 hour  Intake 356 ml  Output --  Net 356 ml     Physical Exam: Vital Signs Blood pressure 132/62, pulse 91, temperature 98.1 F (36.7 C), resp. rate 14, height 5\' 8"  (1.727 m), weight 82.3 kg, SpO2 (!) 87 %. Constitutional: No distress . Vital signs reviewed. HENT: Normocephalic.  Atraumatic. Eyes: EOMI. No discharge. Cardiovascular: No JVD. Respiratory: Normal effort.  No stridor. +Pleurx.  GI: Non-distended. Skin: Warm and dry. Wounds not examined today. Psych: Generalized edema, improving Musc: No edema in extremities.  No tenderness in extremities. Neuro: Alert Motor: 4 --4/5 throughout, unchanged, shoulder limited  Assessment/Plan: 1. Functional deficits secondary to debility which require 3+ hours per day of interdisciplinary therapy in a comprehensive inpatient rehab setting.  Physiatrist is providing close team supervision and 24 hour management of active medical problems listed below.  Physiatrist and rehab team continue to assess barriers to discharge/monitor patient progress toward functional and medical goals  Care Tool:  Bathing    Body parts bathed by patient: Right arm, Left arm, Chest, Abdomen, Right upper leg, Left upper leg, Face   Body parts bathed by helper: Front perineal area, Buttocks, Right lower leg, Left lower leg     Bathing assist Assist Level: Moderate  Assistance - Patient 50 - 74%     Upper Body Dressing/Undressing Upper body dressing   What is the patient wearing?: Button up shirt(zip up jacket)    Upper body assist Assist Level: Moderate Assistance - Patient 50 - 74%    Lower Body Dressing/Undressing Lower body dressing      What is the patient wearing?: Incontinence brief, Pants     Lower body assist Assist for lower body dressing: Total Assistance - Patient < 25%     Toileting Toileting    Toileting assist Assist for toileting: Total Assistance - Patient < 25%     Transfers Chair/bed transfer  Transfers assist  Chair/bed transfer activity did not occur: Safety/medical concerns(nausea/dizziness/fatigue)  Chair/bed transfer assist level: Moderate Assistance - Patient 50 - 74%(squat pivot) Chair/bed transfer assistive device: Programmer, multimedia   Ambulation assist   Ambulation activity did not occur: Safety/medical concerns(nausea/dizziness/fatigue)  Assist level: Minimal Assistance - Patient > 75% Assistive device: Walker-rolling Max distance: 42 ft   Walk 10 feet activity   Assist  Walk 10 feet activity did not occur: Safety/medical concerns  Assist level: Minimal Assistance - Patient > 75% Assistive device: Walker-rolling   Walk 50 feet activity   Assist Walk 50 feet with 2 turns activity did not occur: Safety/medical concerns         Walk 150 feet activity   Assist Walk 150 feet activity did not occur: Safety/medical concerns         Walk 10 feet on uneven surface  activity   Assist Walk 10 feet on uneven surfaces activity did not occur: Safety/medical concerns  Wheelchair     Assist Will patient use wheelchair at discharge?: No(sternal precautions) Type of Wheelchair: Manual    Wheelchair assist level: Supervision/Verbal cueing Max wheelchair distance: 50 ft    Wheelchair 50 feet with 2 turns activity    Assist        Assist Level:  Supervision/Verbal cueing   Wheelchair 150 feet activity     Assist          Blood pressure 132/62, pulse 91, temperature 98.1 F (36.7 C), resp. rate 14, height 5\' 8"  (1.727 m), weight 82.3 kg, SpO2 (!) 87 %.  Medical Problem List and Plan: 1.Debilitysecondary to CAD status post CABG 03/15/2019. Impella device removed 04/10/2019. Sternal precautions  Continue CIR   Team conference today to discuss current and goals and coordination of care, home and environmental barriers, and discharge planning with nursing, case manager, and therapies.  2. Antithrombotics: -DVT/anticoagulation:SCDs- due to hematuria -antiplatelet therapy: Aspirin 81 mg daily 3. Pain Management:Oxycodone as needed  Controlled on 4/16 4. Mood:Provide emotional support   Remeron started on 4/14 with improvement -antipsychotic agents: N/A 5. Neuropsych: This patientiscapable of making decisions on hisown behalf. 6. Skin/Wound Care:Routine skin checks  Continue PRAFO's for bilateral lower extremities 7. Fluids/Electrolytes/Nutrition:Monitor I/Os 8. Acute systolic congestive heart failure/cardiogenic shock. Follow-up cardiology services Filed Weights   06/04/19 0342 06/04/19 1615 06/04/19 2131  Weight: 85.4 kg 83.9 kg 82.3 kg   Improving on 4/19, no weights again for today 9. Acute hypoxic respiratory failure with Pseudomonas pneumonia. Status post tracheostomy 03/27/2019. Decannulated 04/28/2019.  Weaned supplemental oxygen  10.Acute on chronic anemia/thrombocytopenia. No more heparin for HD for now  Hemoglobin 8.7 on 4/16, labs with HD ordered  Platelets 157 on 4/16, labs with HD ordered  HIT negative 11. Dysphagia/nausea.   Advance to regular thins.  Follow-up speech therapy.Nasogastric tube/cortrakfor nutritional support.   Dietary follow-up- see dietary note dated 4/6 for additional information.   -PEG not safe option as per cardiothoracic surgery team, will  consider follow-up with CTS if necessary.   4/9- Added Phenergan 12.5 mg Q6 hours prn for nausea since zofran doesn't workAdded scopolamine patch for nausea and dizziness.   KUB personally reviewed, bowel gas pattern.    Encourage patient to have son bring in food that patient enjoys and have it modified, reminded again  ECG reviewed, borderline QTC-trial low-dose Reglan with meals on 4/14, with improvement, DC'd on 4/19  Remeron started on 4/14  Oral intake continues to improve 12. 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 Patient has been transitioned to dialysis and being sent to OP HD after CIR, so is chronicand followed by renal services. Tunneled L IJcatheter placed 04/24/2019, exchanged 13. Hematuria. Cystoscopy completed with evacuation of clot. No further bleeding episodes per notes, intermittent bleeding.   Continue to monitor 14. Left pleural effusion. Status post Pleurx catheter 04/23/2019.PATIENT WILL KEEP PLEURX CATHETER FOR NOW PER CVTS.Currently being drained Mondaysand Thursdays 15. Diffuse drug rashversus contact dermatitis: Resolved  Steroid taper completed   Benadryl prn, Sarna lotion, hypoallergenic sheets.  16.Rectal bleeding. 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 Follow-up GI with conservative care no further bleeding reported. 17. PAF. Patient remains normal sinus rhythm. Currently off amiodarone. No plan to resume any anticoagulation at this time until hematuria fully resolved.  Heart rate controlled on 4/21  Monitor with increased exertion 18.  Prediabetes  Monitor with increased mobility   Controlled on 4/21 19.  Leukocytosis: Resolved  Afebrile 20.  Severe  hypoalbuminemia  Supplement initiated, stressed importance again  LOS: 15 days A FACE TO FACE EVALUATION WAS PERFORMED  Jesse Murphy Lorie Phenix 06/06/2019, 10:01 AM

## 2019-06-06 NOTE — Progress Notes (Signed)
Removed 315m of cloudy amber colored fluid from PleurX tube. Pt tolerated well. Sterile dressing applied. All pt needs met.

## 2019-06-06 NOTE — Patient Care Conference (Signed)
Inpatient RehabilitationTeam Conference and Plan of Care Update Date: 06/06/2019   Time: 11:05 AM    Patient Name: Jesse Murphy      Medical Record Number: [id number]  Date of Birth: 08-Feb-1929 Sex: Male         Room/Bed: 4W25C/4W25C-01 Payor Info: Payor: MEDICARE / Plan: MEDICARE PART A AND B / Product Type: *No Product type* /    Admit Date/Time:  05/22/2019  3:48 PM  Primary Diagnosis:  Debility  Patient Active Problem List   Diagnosis Date Noted  . Prediabetes   . Hypoalbuminemia due to protein-calorie malnutrition ([place])   . Failure to thrive in adult   . Allergic contact dermatitis   . ESRD on dialysis ([place])   . Dysphagia   . Thrombocytopenia ([place])   . Acute on chronic anemia   . Supplemental oxygen dependent   . Acute systolic congestive heart failure ([place])   . Pressure ulcer 05/23/2019  . Debility 05/22/2019  . AKI (acute kidney injury) ([place])   . Palliative care encounter   . Pressure injury of skin 03/25/2019  . Acute respiratory failure with hypoxia ([place])   . S/P CABG x 3 03/16/2019  . Non-ST elevation (NSTEMI) myocardial infarction ([place])   . Acute on chronic combined systolic and diastolic CHF (congestive heart failure) ([place])   . Coronary artery disease due to lipid rich plaque   . Atrial fibrillation with RVR ([place]) 03/14/2019  . Atrial fibrillation with rapid ventricular response ([place]) 03/13/2019  . Chest pain 09/12/2018  . Chronic diastolic heart failure ([place]) 05/29/2018  . Hypertension 05/29/2018  . Non Hodgkin's lymphoma ([place]) 09/30/2017  . Heme positive stool 08/11/2017  . Chronic anticoagulation 07/05/2016  . CAD S/P percutaneous coronary angioplasty 07/05/2016  . Type 2 diabetes mellitus without complications ([place]) 83/66/2947  . PAF (paroxysmal atrial fibrillation) ([place]) 06/14/2016  . Chest pain in adult 06/14/2016  . Dyspnea on exertion 06/14/2016  . Allergic rhinitis 06/14/2016  . GERD (gastroesophageal reflux disease) 06/14/2016  . BPH (benign  prostatic hyperplasia) 06/14/2016    Expected Discharge Date: Expected Discharge Date: 06/14/19  Team Members Present: Physician leading conference: Dr. Delice Lesch Care Coodinator Present: Nestor Lewandowsky, RN, BSN, CRRN;Christina Sampson Goon, BSW;Genie Saud Bail, RN, MSN Nurse Present: Isla Pence, RN PT Present: Michaelene Song, PT OT Present: Meriel Pica, OT SLP Present: Colon Flattery, SLP PPS Coordinator present : Ileana Ladd, PT     Current Status/Progress Goal Weekly Team Focus  Bowel/Bladder   Continent and incontinent of b/b, LBM 4/20  To be continent completely  PRN/QS toileting assessments   Swallow/Nutrition/ Hydration   advanced to regular/thin. chin tuck with liquids  Mod I  increased PO intake, diet tolerance.   ADL's   min UB dressing, max LB dressing and toileting, mod A transfer to [facility], minimal progress this week  min A with self care, S with toilet or BSC transfer  ADL training, functional mobility, activity tolerance, pt/family education   Mobility   Pt very dizzy on 4/20. Could not attempt ambulation due to symptoms. min/modA supine<>sit, minA-maxA for sit to stand with RW, depending fatigue, minA/modA ambulation 20'.  supervision/CGA overall  activity tolerance, BLE strengthening, bed mobility training, transfer training, gait training   Communication             Safety/Cognition/ Behavioral Observations  Min A  Supervision  complex problem solving, attention, $$/med mgmt   Pain   Pt has no c/o pain at this time  pain < 3/10  QS/PRN pain assessments   Skin   Stage 2 buttocks, deep tissue on r heel  To prevent any further breakdown and help the healing process continue.  QS/PRN skin assessments    Rehab Goals Patient on target to meet rehab goals: Yes *See Care Plan and progress notes for long and short-term goals.     Barriers to Discharge  Current Status/Progress Possible Resolutions Date Resolved   Nursing                  PT                     OT                  SLP                SW Home environment access/layout;Decreased caregiver support 1 level 4 step with bil railings to entry to home HD T, TH, Sa at [facility] at discharge; declined SCAT application          Discharge Planning/Teaching Needs:  Home with wife and son; daughter to assist after work  Transfers, toileting, medications, diet, etc   Team Discussion: MD nutrition better, daily weights needed, monitoring labs.  RN BM today, cont/inc, HD today, BS 92, dizzyness.  OT refused yesterday, deconditioned, stand mod/max, self care UB mod A, max A LB self care, transfers mod A.  PT min to max sit to stand, min/mod A amb 20' RW.  SLP S goals, reg/thins, mild cog deficits, mod A use calculator.  Fam ed with son/dtr on 4/27 scheduled.   Revisions to Treatment Plan: N/A     Medical Summary Current Status: Debility secondary to CAD status post CABG 03/15/2019.  Impella device removed 04/10/2019. Weekly Focus/Goal: Improve mobility, nutrition, wounds  Barriers to Discharge: Medical stability;Hemodialysis;Other (comments);Nutrition means;Wound care;Incontinence   Possible Resolutions to Barriers: Therapies, oral intake improving, follow labs, follow weights, Nephro recs   Continued Need for Acute Rehabilitation Level of Care: The patient requires daily medical management by a physician with specialized training in physical medicine and rehabilitation for the following reasons: Direction of a multidisciplinary physical rehabilitation program to maximize functional independence : Yes Medical management of patient stability for increased activity during participation in an intensive rehabilitation regime.: Yes Analysis of laboratory values and/or radiology reports with any subsequent need for medication adjustment and/or medical intervention. : Yes   I attest that I was present, lead the team conference, and concur with the assessment and plan of the  team.   Jodell Cipro M 06/06/2019, 2:10 PM   Team conference was held via web/ teleconference due to [place] - 19

## 2019-06-06 NOTE — Progress Notes (Signed)
Physical Therapy Weekly Progress Note  Patient Details  Name: Jesse Murphy MRN: [id number] Date of Birth: 1929-02-04  Beginning of progress report period: May 30, 2019 End of progress report period: June 06, 2019  Today's Date: 06/06/2019 PT Individual Time: 0947-0962 PT Individual Time Calculation (min): 55 min   Patient has met 1 of 3 short term goals.  Pt is progressing toward PT functional goals, improving ambulation distance and assistance required for ambulation. Pt also improving independence with ambulation with use of bed features. Pt's progress has been limited by impaired activity tolerance, requiring frequent rest breaks during sessions. Pt has also had missed treatment sessions due to feeling poorly with difficulty managing blood pressures and positive for orthostatic hypotension. Treatment sessions focused on improving endurance and independence with functional transfers and increasing ambulation distance with RW.  Patient continues to demonstrate the following deficits muscle weakness, decreased cardiorespiratoy endurance, decreased problem solving and decreased sitting balance, decreased standing balance, decreased postural control and decreased balance strategies and therefore will continue to benefit from skilled PT intervention to increase functional independence with mobility.  Patient progressing toward long term goals..  Continue plan of care.  PT Short Term Goals Week 2:  PT Short Term Goal 1 (Week 2): Pt will transfer bed<>chair min assist and LRAD PT Short Term Goal 1 - Progress (Week 2): Progressing toward goal PT Short Term Goal 2 (Week 2): Pt will perform sit<>stand with min assist and RW PT Short Term Goal 2 - Progress (Week 2): Progressing toward goal PT Short Term Goal 3 (Week 2): Pt will ambulate x 20 ft with mod assist and LRAD PT Short Term Goal 3 - Progress (Week 2): Met  Skilled Therapeutic Interventions/Progress Updates:  Ambulation/gait  training;Community reintegration;DME/adaptive equipment instruction;Neuromuscular re-education;Psychosocial support;Stair training;UE/LE Strength taining/ROM;Balance/vestibular training;Discharge planning;Functional electrical stimulation;Pain management;Skin care/wound management;Therapeutic Activities;UE/LE Coordination activities;Cognitive remediation/compensation;Disease management/prevention;Functional mobility training;Patient/family education;Therapeutic Exercise   Pt received supine in bed and agreeable to therapy. Denies pain. PT provides total assist to don TED hose and shoes. Orthostatic vitals taken prior to and after bed mobility which pt performs with superversion for sequencing and hand placement. Vitals are as follows:  Supine: 131/63 HR 88 Sitting 124/58 HR 91 Standing: 116/45 HR 98  Pt performs sit to stand from mattress on lowest setting with modA and cuing for anterior weight shift and sequencing of transfer. Pt verbalizes some dizzines supon standing but not as significant as previous session. Stand step to RW with minA. WC transport to therapy gym for energy conservation.  Pt performs sit to stand from [facility] with minA and improved body mechanics. Ambulates bouts of 15', 30', and 20' with seated rest breaks on mat table. Pt rates Borg RPE at 18/20 following 30' ambulation. Pt feels that LLE about to buckle during final bout and requests to defer further walking. Pt performs x2 additional reps of sit to stand with minA and marches in place for ~30 seconds.   WC transport back to room. Pt left seated in WC with alarm intact and all needs within reach.  Therapy Documentation Precautions:  Precautions Precautions: Fall Precaution Booklet Issued: Yes (comment) Precaution Comments: monitor for orthostatic hypotension Restrictions Weight Bearing Restrictions: No RUE Weight Bearing: Weight bearing as tolerated RUE Partial Weight Bearing Percentage or Pounds: No restrictions Other  Position/Activity Restrictions: no longer has sternal precautions or UE PWB precautions - confirmed with Dr. Dagoberto Ligas on 4/9   Therapy/Group: Individual Therapy  Breck Coons, PT, DPT 06/06/2019, 12:42 PM

## 2019-06-06 NOTE — Progress Notes (Signed)
Speech Language Pathology Daily Session Note  Patient Details  Name: Jesse Murphy MRN: [id number] Date of Birth: 1928-08-21  Today's Date: 06/06/2019 SLP Individual Time: 1015-1100 SLP Individual Time Calculation (min): 45 min  Short Term Goals: Week 2: SLP Short Term Goal 1 (Week 2): Pt will consume current diet with minimal s/sx aspiration and efficient mastication and oral clearance with supervision cues for use of compensatory strategies SLP Short Term Goal 2 (Week 2): Pt will demonstrate selective attention to functional tasks for 10  minutes with Min A verbal/visual cues for redirection. SLP Short Term Goal 3 (Week 2): Pt will use external aids to orient to date with Min A verbal/visual cues SLP Short Term Goal 4 (Week 2): Pt will demonstrate ability to problem solve basic familiar tasks with Min A verbal/visual cues  Skilled Therapeutic Interventions: Pt was seen for skilled ST intervention targeting aforementioned goals. Pt was pleasant and cooperative today, feeling much better than yesterday. SLP facilitated session by providing checkbook activity. Pt required max A with writing entries correctly in a register. Poor legibility of handwriting. Task simplified - SLP wrote math problems in larger print. Mod + cues to use calculator correctly. Pt noted his daughter is managing finances at this time, and will be able to continue to do that at DC. Pt agreeable to continue work on $ management in therapy.  Pt was left in wheelchair with alarm on, all needs within reach. Continue ST per current plan of care.   Pain Pain Assessment Pain Scale: 0-10 Pain Score: 0-No pain  Therapy/Group: Individual Therapy  Jesse Murphy B. Quentin Ore, [facility], CCC-SLP Speech Language Pathologist  Jesse Murphy 06/06/2019, 11:05 AM

## 2019-06-06 NOTE — Progress Notes (Signed)
Can speech sign Jesse Murphy off to assist with meals?  He is able to verbalize precautions and is here for lunch and some dinners daily.  Pt d/c date is the 29th. Thank you for the consideration!

## 2019-06-07 ENCOUNTER — Ambulatory Visit (HOSPITAL_COMMUNITY): Payer: Medicare Other | Admitting: Hematology

## 2019-06-07 ENCOUNTER — Inpatient Hospital Stay (HOSPITAL_COMMUNITY): Payer: Medicare Other

## 2019-06-07 ENCOUNTER — Inpatient Hospital Stay (HOSPITAL_COMMUNITY): Payer: Medicare Other | Admitting: Occupational Therapy

## 2019-06-07 ENCOUNTER — Inpatient Hospital Stay (HOSPITAL_COMMUNITY): Payer: Medicare Other | Admitting: Speech Pathology

## 2019-06-07 DIAGNOSIS — Z9689 Presence of other specified functional implants: Secondary | ICD-10-CM

## 2019-06-07 DIAGNOSIS — R6251 Failure to thrive (child): Secondary | ICD-10-CM

## 2019-06-07 LAB — RENAL FUNCTION PANEL
Albumin: 1.9 g/dL — ABNORMAL LOW (ref 3.5–5.0)
Anion gap: 10 (ref 5–15)
BUN: 43 mg/dL — ABNORMAL HIGH (ref 8–23)
CO2: 27 mmol/L (ref 22–32)
Calcium: 7.9 mg/dL — ABNORMAL LOW (ref 8.9–10.3)
Chloride: 98 mmol/L (ref 98–111)
Creatinine, Ser: 6.86 mg/dL — ABNORMAL HIGH (ref 0.61–1.24)
GFR calc Af Amer: 8 mL/min — ABNORMAL LOW (ref >60)
GFR calc non Af Amer: 7 mL/min — ABNORMAL LOW (ref >60)
Glucose, Bld: 108 mg/dL — ABNORMAL HIGH (ref 70–99)
Phosphorus: 3.8 mg/dL (ref 2.5–4.6)
Potassium: 5.7 mmol/L — ABNORMAL HIGH (ref 3.5–5.1)
Sodium: 135 mmol/L (ref 135–145)

## 2019-06-07 LAB — CBC
HCT: 26.8 % — ABNORMAL LOW (ref 39.0–52.0)
Hemoglobin: 7.9 g/dL — ABNORMAL LOW (ref 13.0–17.0)
MCH: 28.3 pg (ref 26.0–34.0)
MCHC: 29.5 g/dL — ABNORMAL LOW (ref 30.0–36.0)
MCV: 96.1 fL (ref 80.0–100.0)
Platelets: 122 10*3/uL — ABNORMAL LOW (ref 150–400)
RBC: 2.79 MIL/uL — ABNORMAL LOW (ref 4.22–5.81)
RDW: 18.3 % — ABNORMAL HIGH (ref 11.5–15.5)
WBC: 7.8 10*3/uL (ref 4.0–10.5)
nRBC: 0 % (ref 0.0–0.2)

## 2019-06-07 LAB — GLUCOSE, CAPILLARY
Glucose-Capillary: 90 mg/dL (ref 70–99)
Glucose-Capillary: 101 mg/dL — ABNORMAL HIGH (ref 70–99)
Glucose-Capillary: 95 mg/dL (ref 70–99)
Glucose-Capillary: 101 mg/dL — ABNORMAL HIGH (ref 70–99)

## 2019-06-07 MED ORDER — ALTEPLASE 2 MG IJ SOLR
INTRAMUSCULAR | Status: AC
  Administered 2019-06-08: 4 mg
  Filled 2019-06-07: qty 4

## 2019-06-07 NOTE — Progress Notes (Signed)
Occupational Therapy Weekly Progress Note  Patient Details  Name: Jesse Murphy MRN: [id number] Date of Birth: 1928/06/08  Beginning of progress report period: May 30, 2019 End of progress report period: June 07, 2019  Today's Date: 06/07/2019 OT Individual Time: 7544-9201 OT Individual Time Calculation (min): 45 min    Patient has met 3 of 4 short term goals.  Pt is progressing with his ability to stand up and transfer and to sit up in a w.c but his overall endurance is very low. After he does 1-2 stands, he is completely exhausted and can not continue to lift up.  Therefore his ability to complete a full self care session has been very limited.  He continues to need a great deal of cues with his sit to stand technique.  When he follow them, he can stand up and transfer with only min A.   Patient continues to demonstrate the following deficits: muscle weakness and muscle joint tightness, decreased oxygen support and decreased standing balance and decreased balance strategies and therefore will continue to benefit from skilled OT intervention to enhance overall performance with BADL.  Patient progressing toward long term goals..  Plan of care revisions: toileting and LB dressing downgraded from min A to mod A due to his low endurance levels..  OT Short Term Goals Week 1:  OT Short Term Goal 1 (Week 1): Pt will be able to rise to stand with min - mod A to prep for clothing management with toileting. OT Short Term Goal 1 - Progress (Week 1): Progressing toward goal OT Short Term Goal 2 (Week 1): Pt will be able to squat pivot to toilet or BSC with mod- max A. OT Short Term Goal 2 - Progress (Week 1): Progressing toward goal OT Short Term Goal 3 (Week 1): Pt will don shirt with set up. OT Short Term Goal 3 - Progress (Week 1): Progressing toward goal OT Short Term Goal 4 (Week 1): Pt will don pants with mod-max A. OT Short Term Goal 4 - Progress (Week 1): Progressing toward goal Week 2:   OT Short Term Goal 1 (Week 2): Pt will rise to stand with mod A of 1 to RW to prepare for LB self care. OT Short Term Goal 1 - Progress (Week 2): Met OT Short Term Goal 2 (Week 2): Pt will complete a stand pivot to [facility] with RW with min A. OT Short Term Goal 2 - Progress (Week 2): Met OT Short Term Goal 3 (Week 2): Pt will be able to don a shirt with set up. OT Short Term Goal 3 - Progress (Week 2): Progressing toward goal OT Short Term Goal 4 (Week 2): Pt will demonstrate improved activity tolerance to stay up in his wc for at least 1 hour at a time. OT Short Term Goal 4 - Progress (Week 2): Met Week 3:  OT Short Term Goal 1 (Week 3): STGs = LTGs  Skilled Therapeutic Interventions/Progress Updates:    Pt seen this session along with family education with his son.  Pt not enthused about changing clothing or bathing but finally agreed when I explained that I needed to reassess his skills. Pt sat to EOB with S.  Doffed shirt with cues and bathed UB. Due to restricted sh ROM he continues to need A to pull shirt over his head. Pt completed 1 sit to stand with min A to RW to practice transfer to [facility] but then became extremely fatigued and could not step  his legs and sat back down on the bed.  Attempted 3x to have pt stand again after a prolonged rest but he could not push up with his legs.  Had him do scoots several times towards pillow before laying down with min a.  Did not work on LB self care due to fatigue.  Discussed equipment needs with son.  Pt does need a BSC and informed SW. Son is also concerned about pt's limited progress and that he continues to need so much A with his self care.   Pt resting in bed with alarm set and all needs met.   Therapy Documentation Precautions:  Precautions Precautions: Fall Precaution Booklet Issued: Yes (comment) Precaution Comments: monitor for orthostatic hypotension Restrictions Weight Bearing Restrictions: No RUE Weight Bearing: Weight bearing as  tolerated RUE Partial Weight Bearing Percentage or Pounds: No restrictions Other Position/Activity Restrictions: no longer has sternal precautions or UE PWB precautions - confirmed with Dr. Dagoberto Ligas on 4/9  Pain: Pain Assessment Pain Scale: 0-10 Pain Score: 0-No pain ADL: ADL Upper Body Bathing: Setup Where Assessed-Upper Body Bathing: Edge of bed Lower Body Bathing: Maximal assistance Where Assessed-Lower Body Bathing: Edge of bed Upper Body Dressing: Moderate assistance Where Assessed-Upper Body Dressing: Edge of bed Lower Body Dressing: Dependent Where Assessed-Lower Body Dressing: Edge of bed Toileting: Dependent Where Assessed-Toileting: Bedside Commode Toilet Transfer: Moderate assistance Toilet Transfer Method: Stand pivot Toilet Transfer Equipment: Bedside commode   Therapy/Group: Individual Therapy  Josiah Nieto 06/07/2019, 2:09 PM

## 2019-06-07 NOTE — Plan of Care (Signed)
  Problem: RH Dressing Goal: LTG Patient will perform lower body dressing w/assist (OT) Description: LTG: Patient will perform lower body dressing with assist, with/without cues in positioning using equipment (OT) Flowsheets (Taken 06/07/2019 1404) LTG: Pt will perform lower body dressing with assistance level of: (LTG downgraded due to low endurance levels) Moderate Assistance - Patient 50 - 74%   Problem: RH Toileting Goal: LTG Patient will perform toileting task (3/3 steps) with assistance level (OT) Description: LTG: Patient will perform toileting task (3/3 steps) with assistance level (OT)  Flowsheets (Taken 06/07/2019 1404) LTG: Pt will perform toileting task (3/3 steps) with assistance level: (LTG downgraded due to low endurance) Moderate Assistance - Patient 50 - 74%

## 2019-06-07 NOTE — Plan of Care (Signed)
  Problem: Consults Goal: RH GENERAL PATIENT EDUCATION Description: See Patient Education module for education specifics. Outcome: Progressing Goal: Skin Care Protocol Initiated - if Braden Score 18 or less Description: If consults are not indicated, leave blank or document N/A Outcome: Progressing Goal: Nutrition Consult-if indicated Outcome: Progressing Goal: Diabetes Guidelines if Diabetic/Glucose > 140 Description: If diabetic or lab glucose is > 140 mg/dl - Initiate Diabetes/Hyperglycemia Guidelines & Document Interventions  Outcome: Progressing   Problem: RH BOWEL ELIMINATION Goal: RH STG MANAGE BOWEL WITH ASSISTANCE Description: STG Manage Bowel with min Assistance. Outcome: Progressing Goal: RH STG MANAGE BOWEL W/MEDICATION W/ASSISTANCE Description: STG Manage Bowel with Medication with min Assistance. Outcome: Progressing   Problem: RH BLADDER ELIMINATION Goal: RH STG MANAGE BLADDER WITH ASSISTANCE Description: STG Manage Bladder With min/mod Assistance Outcome: Progressing   Problem: RH SKIN INTEGRITY Goal: RH STG SKIN FREE OF INFECTION/BREAKDOWN Description: Patients skin will remain free from FURTHER breakdown or infection with min/mod assist. Outcome: Progressing Goal: RH STG MAINTAIN SKIN INTEGRITY WITH ASSISTANCE Description: STG Maintain Skin Integrity With min/mod Assistance. Outcome: Progressing Goal: RH STG ABLE TO PERFORM INCISION/WOUND CARE W/ASSISTANCE Description: STG Able To Perform Incision/Wound Care With total assistance from caregiver. Outcome: Progressing   Problem: RH SAFETY Goal: RH STG ADHERE TO SAFETY PRECAUTIONS W/ASSISTANCE/DEVICE Description: STG Adhere to Safety Precautions With min Assistance/Device. Outcome: Progressing   Problem: RH PAIN MANAGEMENT Goal: RH STG PAIN MANAGED AT OR BELOW PT'S PAIN GOAL Description: < 4 Outcome: Progressing

## 2019-06-07 NOTE — Progress Notes (Signed)
Speech Language Pathology Weekly Progress and Session Note  Patient Details  Name: Jesse Murphy MRN: [id number] Date of Birth: 11-Jul-1928  Beginning of progress report period: May 31, 2019 End of progress report period: June 07, 2019  Today's Date: 06/07/2019 SLP Individual Time: 0902-0958 SLP Individual Time Calculation (min): 56 min  Short Term Goals: Week 2: SLP Short Term Goal 1 (Week 2): Pt will consume current diet with minimal s/sx aspiration and efficient mastication and oral clearance with supervision cues for use of compensatory strategies SLP Short Term Goal 1 - Progress (Week 2): Progressing toward goal SLP Short Term Goal 2 (Week 2): Pt will demonstrate selective attention to functional tasks for 10  minutes with Min A verbal/visual cues for redirection. SLP Short Term Goal 2 - Progress (Week 2): Met SLP Short Term Goal 3 (Week 2): Pt will use external aids to orient to date with Min A verbal/visual cues SLP Short Term Goal 3 - Progress (Week 2): Met SLP Short Term Goal 4 (Week 2): Pt will demonstrate ability to problem solve basic familiar tasks with Min A verbal/visual cues SLP Short Term Goal 4 - Progress (Week 2): Progressing toward goal    New Short Term Goals: Week 3: SLP Short Term Goal 1 (Week 3): STG=LTG due to remaining LOS  Weekly Progress Updates: Pt has made functional gains and met 2 out of 4 short term goals. Pt currently required Moderate assistance to complete mildly complex to complex cognitive tasks due to impairments primarily impacting his problem solving and elective attention. Pt is consuming regular diet with thin liquids and requires intermittent verbal cueing to implement chin tuck strategy with liquid intake, however is becoming more independent with his use of that strategy. Pt has demonstrated improved attention to tasks, participation, volume of PO intake and independence with swallow strategies. Pt and family education is ongoing. Pt would  continue to benefit from skilled ST while inpatient in order to maximize functional independence and reduce burden of care prior to discharge. Anticipate that pt will need 24/7 supervision at discharge in addition to [place] follow up at next level of care.       Intensity: Minumum of 1-2 x/day, 30 to 90 minutes Frequency: 3 to 5 out of 7 days Duration/Length of Stay: 4/29 Treatment/Interventions: Dysphagia/aspiration precaution training;Functional tasks;Patient/family education;Cueing hierarchy;Cognitive remediation/compensation;Environmental controls;Internal/external aids   Daily Session Skilled Therapeutic Interventions: Pt was seen for skilled ST targeting cognition. Pt could recall 1 name and 1 additional function of familiar medications but unaware of any current medication names or functions. He demonstrated some difficulty with mental flexibility, requiring verbal cues to acknowledge his regimen is different now and rationale behind need to gain awareness of current medications and rehearse organizing QID pill box. Pt did organize a QID pill box according to list of his current medications with a Min A question cue to detect and correct 1 error. Pt left laying in bed with alarm set and needs within reach. Continue per current plan of care.          Pain Pain Assessment Pain Scale: 0-10 Pain Score: 0-No pain  Therapy/Group: Individual Therapy  Arbutus Leas 06/07/2019, 7:05 AM

## 2019-06-07 NOTE — Progress Notes (Addendum)
Patient ID: Jesse Murphy, male   DOB: [birth year], 86 y.o.   MRN: [id number]    Advanced Heart Failure Rounding Note  PCP-Cardiologist: Kate Sable, MD   Subjective:    Pt feels that he is making progress and getting stronger but RN states that he has not been very motivated to participate w/ rehab. Complained of dizziness this morning and decline rehab session. RN states that she checked orthostatics and negative.   Pt also complained of nausea w/ breakfast this am and some vomiting. Improved w/ zofran.   Going back to HD today. Session yesterday was terminated early due to HD cath clotting.  He is still making some urine. RN reports concerns regarding cloudy colored urine w/ white discharge. He denies urinary symptoms. No gross hematuria. CBC pending.   He otherwise looks well and denies any cardiac symptoms. No CP or dyspnea.   Objective:   Weight Range: 81.8 kg Body mass index is 27.42 kg/m.   Vital Signs:   Temp:  [97.8 F (36.6 C)-98.9 F (37.2 C)] 98.4 F (36.9 C) (04/22 0500) Pulse Rate:  [80-88] 83 (04/22 0500) Resp:  [16-18] 16 (04/22 0500) BP: (114-123)/(47-53) 116/50 (04/22 0500) SpO2:  [95 %-98 %] 95 % (04/22 0500) Weight:  [80.5 kg-81.8 kg] 81.8 kg (04/22 0500) Last BM Date: 06/06/19  Weight change: Filed Weights   06/06/19 1126 06/06/19 1339 06/07/19 0500  Weight: 84.4 kg 80.5 kg 81.8 kg    Intake/Output:   Intake/Output Summary (Last 24 hours) at 06/07/2019 1224 Last data filed at 06/07/2019 0700 Gross per 24 hour  Intake 606 ml  Output 31 ml  Net 575 ml     Physical Exam   PHYSICAL EXAM: General:  Well appearing elderly WM sitting up in bed. No respiratory difficulty HEENT: normal anicteric Neck: supple. no JVD. Previous trach site well healed. Carotids 2+ bilat; no bruits. No lymphadenopathy or thyromegaly appreciated. Cor: PMI nondisplaced. Regular rate & rhythm. No rubs, gallops or murmurs. + Left  sided HD cath  Lungs: clear no  wheeze Abdomen: soft, nontender, nondistended. No hepatosplenomegaly. No bruits or masses. Good bowel sounds. Extremities: no cyanosis, clubbing, rash, edema Neuro: alert & oriented x 3, cranial nerves grossly intact. moves all 4 extremities w/o difficulty. Affect pleasant  ECG: NSR 88 LBBB Personally reviewed   Telemetry   N/A   Labs    CBC No results for input(s): WBC, NEUTROABS, HGB, HCT, MCV, PLT in the last 72 hours. Basic Metabolic Panel No results for input(s): NA, K, CL, CO2, GLUCOSE, BUN, CREATININE, CALCIUM, MG, PHOS in the last 72 hours. Liver Function Tests No results for input(s): AST, ALT, ALKPHOS, BILITOT, PROT, ALBUMIN in the last 72 hours. No results for input(s): LIPASE, AMYLASE in the last 72 hours. Cardiac Enzymes No results for input(s): CKTOTAL, CKMB, CKMBINDEX, TROPONINI in the last 72 hours.  BNP: BNP (last 3 results) Recent Labs    03/22/19 0401 03/26/19 0626 03/27/19 1255  BNP 340.4* 687.5* 800.4*    ProBNP (last 3 results) No results for input(s): PROBNP in the last 8760 hours.   D-Dimer No results for input(s): DDIMER in the last 72 hours. Hemoglobin A1C No results for input(s): HGBA1C in the last 72 hours. Fasting Lipid Panel No results for input(s): CHOL, HDL, LDLCALC, TRIG, CHOLHDL, LDLDIRECT in the last 72 hours. Thyroid Function Tests No results for input(s): TSH, T4TOTAL, T3FREE, THYROIDAB in the last 72 hours.  Invalid input(s): FREET3  Other results:   Imaging  DG Chest 2 View  Result Date: 06/07/2019 CLINICAL DATA:  Shortness of breath.  Chest pain. EXAM: CHEST - 2 VIEW COMPARISON:  05/22/2019. FINDINGS: Left central line and left chest tube in stable position. Prior CABG. Left atrial appendage clip in stable position. Cardiomegaly. Diffuse bilateral pulmonary interstitial prominence and bilateral pleural effusions again noted suggesting CHF. Left pleural effusion appears to have increased on today's exam. No  pneumothorax. IMPRESSION: 1.  Left central line and left chest tube in stable position. 2. Prior CABG. Cardiomegaly with bilateral interstitial prominence and bilateral pleural effusions again noted suggesting CHF. Left pleural effusion appears to have increased on today's exam. Electronically Signed   By: Frederick   On: 06/07/2019 08:00     Medications:     Scheduled Medications: . aspirin  81 mg Oral Daily  . Chlorhexidine Gluconate Cloth  6 each Topical Q0600  . darbepoetin (ARANESP) injection - DIALYSIS  100 mcg Intravenous Q Wed-HD  . famotidine  10 mg Oral Daily  . feeding supplement (NEPRO CARB STEADY)  237 mL Oral BID BM  . feeding supplement (PRO-STAT SUGAR FREE 64)  30 mL Oral BID  . Gerhardt's butt cream   Topical BID  . hydrocortisone   Rectal BID  . hydrocortisone  25 mg Rectal BID  . hydrocortisone cream   Topical BID  . insulin aspart  0-15 Units Subcutaneous TID WC  . metoCLOPramide  5 mg Oral TID AC & HS  . multivitamin  1 tablet Oral QHS  . nutrition supplement (JUVEN)  1 packet Oral BID BM  . ondansetron  4 mg Oral Once  . scopolamine  1 patch Transdermal Q72H  . simethicone  80 mg Oral TID  . sodium chloride flush  10-40 mL Intracatheter Q12H    Infusions: . anticoagulant sodium citrate      PRN Medications: oxyCODONE **AND** acetaminophen, camphor-menthol, diphenhydrAMINE, lidocaine, ondansetron, promethazine, sodium chloride, sodium chloride flush    Assessment/Plan   1. Acute systolic HF -> Cardiogenic shock - post-op echo on 03/20/19 EF 25-30% (pre-op 30-35%. In 12/2017 EF normal) - Required Impella support post CABG. Impella removed 2/23. Post-op TEE EF 40% - Has developed ESRD. Volume controlled through HD - Volume stable. Continue HD per nephrology    - previously had been unable to tolerate GDMT with low BP and ESRD.   2. CAD s/p CABG this admit (LIMA->LAD, SVG->OM, SVG->RCA on 03/15/19) - No s/s ischemia - Continue statin.  - Off ASA  due to recent issues w/ hematuria and BRBPR  3. Acute hypoxic respiratory failure with Pseudomonas PNA - s/p trach on 03/27/19 - completed meropenem for pseudomonas PNA - trach aspirate 3/5 w/ regrowth of Pseudomonas again.   - Completed antibiotic course.  - Trach decannulated 3/13. Stable on [place] - Stable no change   4. PAF - s/p MAZE and LAA occlusion - NSR on EKG 4/22 - Off amio due to rash (unclear which agent it was) - Can reconsider AC in near future as bladder issues stabilize. Can add apixaban 2.5 mg bid if recurrent AF  5. AKI -> ESRD - due to shock/ATN.  - Tunneled cath placed 3/9. Clotted 4/8 s/p exchange by IR - He is tolerating iHD. Midodrine now off.  - Vein mapping complete. Nephrology considering permanent access but no formal plans   6. Hematuria -Cystoscopy 3/15 with fulgeration - Back to OR 3/28 with fulgeration and formalin installation - monitor closely for recurrence.  - CBC pending  7. Complete heart block with profound symptomatic bradycardia - likely vagal in nature - s/p emergent TVP 3/25 - resolved TVP out.   8. Debility, severe - improving w/ CIR. Appreciate their assistance   9. F/E/N - PO intake has improved, no longer requiring TFs     10. Left Pleural Effusion  - moderate to large. - s/p PleurX on 3/8.  - TCTS managing. Draining M/Th  10. Rash - Initially w/ diffuse rash on extremities and torso w/ appearance of drug induced rash. - Steroids started 3/21 w/ improvement/ resolution. Now off prednisone - redeveloped diffuse rash 4/6 after addition of Reglan and atorvastatin. Both agents discontinued. Appears to be slowly improving.     11. Anemia: - hgb 9.0 -> 7.9 -> 10.9 -> 11.2->11.4->10.7 -> 11.0- >10.5->->9.3->8.7 - check CBC today   12. Dysphagia and BRBPR - ? Possible esophagitis +/- radiation proctitis - GI has seen.Treated with IV fluconazole.  - Resolved  13. Hyperkalemia - resolved. K has been stable on HD  14. GU -  RN concerned regarding urine appearance (cloudy/ w/ white colored discharge) - he denies dysuria - CBC pending. If leukocytosis, check UA    Lyda Jester, PA-C  12:24 PM    Patient seen and examined with the above-signed Advanced Practice Provider and/or Housestaff. I personally reviewed laboratory data, imaging studies and relevant notes. I independently examined the patient and formulated the important aspects of the plan. I have edited the note to reflect any of my changes or salient points. I have personally discussed the plan with the patient and/or family.  Stable from CV standpoint. Remains in NSR. HD catheter clotted yesterday so going back to HD today.   Apparently still making some urine. Will need to follow closely as bladder has received formalin and nonfunctional.   BP stable. Pleurex cath continues to drain.  We will continue to follow.   Glori Bickers, MD  5:55 PM

## 2019-06-07 NOTE — Progress Notes (Signed)
Team Conference Report to Patient/Family  Team Conference discussion was reviewed with the patient's son, including goals, any changes in plan of care and target discharge date.  Patient's son expressed understanding and is in agreement.  The patient has a target discharge date of 06/14/19. With supervision goals. HH follow up scheduled with [facility]. Family education confirmed for 06/12/19 @ 1p.m. Patient is scheduled to go to HD at [facility] in [place] on Saturday; 06/16/19. Son is to share information with his sister.  [name] 06/07/2019, 10:10 AM

## 2019-06-07 NOTE — Progress Notes (Signed)
Admit: 05/22/2019 LOS: 39  65M ESRD after cardiogenic shock; CAD s/p CABG  Subjective:  . HD yesterday, TDC worked poorly despite short tPA dwell. . TPA left in HD cath after DC . No c/o this AM  04/21 0701 - 04/22 0700 In: 842 [P.O.:842] Out: 31   Filed Weights   06/06/19 1126 06/06/19 1339 06/07/19 0500  Weight: 84.4 kg 80.5 kg 81.8 kg    Scheduled Meds: . aspirin  81 mg Oral Daily  . Chlorhexidine Gluconate Cloth  6 each Topical Q0600  . darbepoetin (ARANESP) injection - DIALYSIS  100 mcg Intravenous Q Wed-HD  . famotidine  10 mg Oral Daily  . feeding supplement (NEPRO CARB STEADY)  237 mL Oral BID BM  . feeding supplement (PRO-STAT SUGAR FREE 64)  30 mL Oral BID  . Gerhardt's butt cream   Topical BID  . hydrocortisone   Rectal BID  . hydrocortisone  25 mg Rectal BID  . hydrocortisone cream   Topical BID  . insulin aspart  0-15 Units Subcutaneous TID WC  . metoCLOPramide  5 mg Oral TID AC & HS  . multivitamin  1 tablet Oral QHS  . nutrition supplement (JUVEN)  1 packet Oral BID BM  . ondansetron  4 mg Oral Once  . scopolamine  1 patch Transdermal Q72H  . simethicone  80 mg Oral TID  . sodium chloride flush  10-40 mL Intracatheter Q12H   Continuous Infusions: . anticoagulant sodium citrate     PRN Meds:.oxyCODONE **AND** acetaminophen, camphor-menthol, diphenhydrAMINE, lidocaine, ondansetron, promethazine, sodium chloride, sodium chloride flush  Current Labs: reviewed    Physical Exam:  Blood pressure (!) 116/50, pulse 83, temperature 98.4 F (36.9 C), resp. rate 16, height 5\' 8"  (1.727 m), weight 81.8 kg, SpO2 95 %. NAD in chair RRR nl s1s2 CTAB L IJ TDC No rashes NCAT EOMI  A 1. ESRD, new during this admission; AKI from #2; 2. S/p cardiogenic shock; CAD s/p CABG 3. Debility in CIR 4. 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 Cont on MWF scheduel: TDC, 3.5h, 3K Bath, 1-2L UF, 400/800 . Needs HD today,  after not completing ysterday . If HD cath nonfunctioning will need IR conslt for [facility] exchange . Needs HD labs today . Cont ESA Aranesp 100 qWed . Need Fe levels . Check Phos after holding phoslo . Medication Issues; o Preferred narcotic agents for pain control are hydromorphone, fentanyl, and methadone. Morphine should not be used.  o Baclofen should be avoided o Avoid oral sodium phosphate and magnesium citrate based laxatives / bowel preps    Pearson Grippe MD 06/07/2019, 11:20 AM  Recent Labs  Lab 06/01/19 1151  NA 134*  K 3.8  CL 97*  CO2 26  GLUCOSE 123*  BUN 46*  CREATININE 5.72*  CALCIUM 8.0*   Recent Labs  Lab 06/01/19 0738  WBC 6.6  NEUTROABS 4.1  HGB 8.7*  HCT 28.1*  MCV 92.1  PLT 157

## 2019-06-07 NOTE — Progress Notes (Signed)
Pt transported to xray 

## 2019-06-07 NOTE — Progress Notes (Signed)
  Subjective: Patient making great progress in rehab Yesterday 300 cc of xanthochromic fluid removed from left Pleurx catheter. Chest x-ray performed today, personally reviewed showing blunting of left costophrenic angle with minimal effusion-pleural thickening. When the patient goes home next week he will need weekly drainage of the Pleurx catheter by home health nurse.  I have completed the face-to-face home health order and will sign the Pleurx catheter form.  I will follow the patient's Pleurx catheter and surgical recovery as an outpatient. Objective: Vital signs in last 24 hours: Temp:  [98.4 F (36.9 C)-98.9 F (37.2 C)] 98.4 F (36.9 C) (04/22 0500) Pulse Rate:  [80-88] 83 (04/22 0500) Resp:  [16-18] 16 (04/22 0500) BP: (114-116)/(47-50) 116/50 (04/22 0500) SpO2:  [95 %-97 %] 95 % (04/22 0500) Weight:  [81.8 kg] 81.8 kg (04/22 0500)  Hemodynamic parameters for last 24 hours:  Sinus rhythm  Intake/Output from previous day: 04/21 0701 - 04/22 0700 In: 842 [P.O.:842] Out: 31  Intake/Output this shift: No intake/output data recorded.  Exam Alert and comfortable Sternal incision well-healed Scattered rales Heart rate regular no murmur  Lab Results: No results for input(s): WBC, HGB, HCT, PLT in the last 72 hours. BMET: No results for input(s): NA, K, CL, CO2, GLUCOSE, BUN, CREATININE, CALCIUM in the last 72 hours.  PT/INR: No results for input(s): LABPROT, INR in the last 72 hours. ABG    Component Value Date/Time   PHART 7.369 05/10/2019 1804   HCO3 20.0 05/10/2019 1804   TCO2 21 (L) 05/10/2019 1804   ACIDBASEDEF 5.0 (H) 05/10/2019 1804   O2SAT 99.0 05/10/2019 1804   CBG (last 3)  Recent Labs    06/06/19 2116 06/07/19 0624 06/07/19 1119  GLUCAP 105* 90 101*    Assessment/Plan: S/P  Plan Home with Pleurx catheter to be managed by home health nurse with weekly drainage.  I will follow the patient's Pleurx catheter as an outpatient.  It is not ready to be  removed yet.   LOS: 16 days    Tharon Aquas Trigt III 06/07/2019

## 2019-06-07 NOTE — Progress Notes (Addendum)
Physical Therapy Session Note  Patient Details  Name: Jesse Murphy MRN: [id number] Date of Birth: [birth year]  Today's Date: 06/07/2019 PT Individual Time: 1030-1055 PT Individual Time Calculation (min): 25 min   Short Term Goals: Week 2:  PT Short Term Goal 1 (Week 2): Pt will transfer bed<>chair min assist and LRAD PT Short Term Goal 1 - Progress (Week 2): Progressing toward goal PT Short Term Goal 2 (Week 2): Pt will perform sit<>stand with min assist and RW PT Short Term Goal 2 - Progress (Week 2): Progressing toward goal PT Short Term Goal 3 (Week 2): Pt will ambulate x 20 ft with mod assist and LRAD PT Short Term Goal 3 - Progress (Week 2): Met Week 3:  PT Short Term Goal 1 (Week 3): Pt will perform sit to stand with minA and LRAD PT Short Term Goal 2 (Week 3): Pt will perform bed ot chair transfer with minA and [place]. PT Short Term Goal 3 (Week 3): Pt will ambulate 72' with minA and LRAD.  Skilled Therapeutic Interventions/Progress Updates:    Session focused on bed mobility retraining, upright tolerance, and overall functional participation. Pt requires min assist to come to EOB and mod A for sit to supine x 2 reps during session. Pt unable to tolerate more than about 1 min each bout seated EOB due to reports of dizziness. BP values assessed below. Pt sits with sitting balance at supervision to min assist and then requests to lay back down. Once back in supine, discussed goals and attempts for upright mobility. Declines recliner at this time. Pt able to scoot up in bed with reverse trendelenberg and pt pushing through BLE. Positioned UE's with pillows for edema management. Significant edema noted. RN present and aware during session of pt's inability to participate.   HOB elevated: 121/51 mmHg; HR = 83 bpm Returned to supine after seated EOB and unable to tolerate > 30 - 45 sec: 116/49 mmHg; HR = 85 bpm. Attempted again in seated and BP change unremarkable.   Therapy  Documentation Precautions:  Precautions Precautions: Fall Precaution Booklet Issued: Yes (comment) Precaution Comments: monitor for orthostatic hypotension Restrictions Weight Bearing Restrictions: No RUE Weight Bearing: Weight bearing as tolerated RUE Partial Weight Bearing Percentage or Pounds: No restrictions Other Position/Activity Restrictions: no longer has sternal precautions or UE PWB precautions - confirmed with Dr. Dagoberto Ligas on 4/9   Pain:  Denies pain. Reports nausea and dizziness with any mobility. RN aware and medication provided.    Therapy/Group: Individual Therapy  Canary Brim Ivory Broad, PT, DPT, CBIS  06/07/2019, 10:58 AM

## 2019-06-07 NOTE — Progress Notes (Signed)
Nephrology requested labs be collected, but no order put in. Verbal order given from Danella Sensing, NP for CBC and Renal panel labs. HD nurse will collect during HD tonight.

## 2019-06-07 NOTE — Progress Notes (Signed)
[facility] PHYSICAL MEDICINE & REHABILITATION PROGRESS NOTE  Subjective/Complaints: Patient seen sitting up in bed this AM.  He states he slept well overnight.  Patient noted to be coughing, per nurse tech, patient started coughing after drinking orange juice.  Patient states he did not use a straw, but did use chin tuck.  Patient states that he had some phlegm that he needed to cough up in the orange juice allowed him to cough it up.  He was seen by nephrology yesterday, notes reviewed-no changes.  However, patient unable to complete dialysis yesterday due to malfunction of ports.  ROS: Denies CP, SOB, N/V/D  Objective:   DG Chest 2 View  Result Date: 06/07/2019 CLINICAL DATA:  Shortness of breath.  Chest pain. EXAM: CHEST - 2 VIEW COMPARISON:  05/22/2019. FINDINGS: Left central line and left chest tube in stable position. Prior CABG. Left atrial appendage clip in stable position. Cardiomegaly. Diffuse bilateral pulmonary interstitial prominence and bilateral pleural effusions again noted suggesting CHF. Left pleural effusion appears to have increased on today's exam. No pneumothorax. IMPRESSION: 1.  Left central line and left chest tube in stable position. 2. Prior CABG. Cardiomegaly with bilateral interstitial prominence and bilateral pleural effusions again noted suggesting CHF. Left pleural effusion appears to have increased on today's exam. Electronically Signed   By: Coqui   On: 06/07/2019 08:00   No results for input(s): WBC, HGB, HCT, PLT in the last 72 hours. No results for input(s): NA, K, CL, CO2, GLUCOSE, BUN, CREATININE, CALCIUM in the last 72 hours.  Intake/Output Summary (Last 24 hours) at 06/07/2019 1145 Last data filed at 06/07/2019 0700 Gross per 24 hour  Intake 606 ml  Output 31 ml  Net 575 ml     Physical Exam: Vital Signs Blood pressure (!) 116/50, pulse 83, temperature 98.4 F (36.9 C), resp. rate 16, height 5\' 8"  (1.727 m), weight 81.8 kg, SpO2 95  %. Constitutional: No distress . Vital signs reviewed. HENT: Normocephalic.  Atraumatic. Eyes: EOMI. No discharge. Cardiovascular: No JVD. Respiratory: Normal effort.  No stridor.  + Pleurx. GI: Non-distended. Skin: Warm and dry.  Wounds not examined today. Psych: Normal mood.  Normal behavior. Musc: Generalized edema, improving. Neuro: Alert Motor: 4 --4/5 throughout, stable, shoulder limited  Assessment/Plan: 1. Functional deficits secondary to debility which require 3+ hours per day of interdisciplinary therapy in a comprehensive inpatient rehab setting.  Physiatrist is providing close team supervision and 24 hour management of active medical problems listed below.  Physiatrist and rehab team continue to assess barriers to discharge/monitor patient progress toward functional and medical goals  Care Tool:  Bathing    Body parts bathed by patient: Right arm, Left arm, Chest, Abdomen, Right upper leg, Left upper leg, Face   Body parts bathed by helper: Front perineal area, Buttocks, Right lower leg, Left lower leg     Bathing assist Assist Level: Moderate Assistance - Patient 50 - 74%     Upper Body Dressing/Undressing Upper body dressing   What is the patient wearing?: Button up shirt(zip up jacket)    Upper body assist Assist Level: Moderate Assistance - Patient 50 - 74%    Lower Body Dressing/Undressing Lower body dressing      What is the patient wearing?: Incontinence brief, Pants     Lower body assist Assist for lower body dressing: Total Assistance - Patient < 25%     Toileting Toileting    Toileting assist Assist for toileting: Total Assistance - Patient <  25%     Transfers Chair/bed transfer  Transfers assist  Chair/bed transfer activity did not occur: Safety/medical concerns(nausea/dizziness/fatigue)  Chair/bed transfer assist level: Moderate Assistance - Patient 50 - 74% Chair/bed transfer assistive device: Arboriculturist   Ambulation assist   Ambulation activity did not occur: Safety/medical concerns(nausea/dizziness/fatigue)  Assist level: Minimal Assistance - Patient > 75% Assistive device: Walker-rolling Max distance: 30'   Walk 10 feet activity   Assist  Walk 10 feet activity did not occur: Safety/medical concerns  Assist level: Minimal Assistance - Patient > 75% Assistive device: Walker-rolling   Walk 50 feet activity   Assist Walk 50 feet with 2 turns activity did not occur: Safety/medical concerns         Walk 150 feet activity   Assist Walk 150 feet activity did not occur: Safety/medical concerns         Walk 10 feet on uneven surface  activity   Assist Walk 10 feet on uneven surfaces activity did not occur: Safety/medical concerns         Wheelchair     Assist Will patient use wheelchair at discharge?: No(sternal precautions) Type of Wheelchair: Manual    Wheelchair assist level: Supervision/Verbal cueing Max wheelchair distance: 50 ft    Wheelchair 50 feet with 2 turns activity    Assist        Assist Level: Supervision/Verbal cueing   Wheelchair 150 feet activity     Assist          Blood pressure (!) 116/50, pulse 83, temperature 98.4 F (36.9 C), resp. rate 16, height 5\' 8"  (1.727 m), weight 81.8 kg, SpO2 95 %.  Medical Problem List and Plan: 1.Debilitysecondary to CAD status post CABG 03/15/2019. Impella device removed 04/10/2019. Sternal precautions  Continue CIR   Chest x-ray personally reviewed, showing increase effusion-await further CTS recs. 2. Antithrombotics: -DVT/anticoagulation:SCDs- due to hematuria -antiplatelet therapy: Aspirin 81 mg daily 3. Pain Management:Oxycodone as needed  Controlled on 4/16 4. Mood:Provide emotional support   Remeron started on 4/14 with improvement -antipsychotic agents: N/A 5. Neuropsych: This patientiscapable of making  decisions on hisown behalf. 6. Skin/Wound Care:Routine skin checks  Continue PRAFO's for bilateral lower extremities 7. Fluids/Electrolytes/Nutrition:Monitor I/Os 8. Acute systolic congestive heart failure/cardiogenic shock. Follow-up cardiology services Filed Weights   06/06/19 1126 06/06/19 1339 06/07/19 0500  Weight: 84.4 kg 80.5 kg 81.8 kg   Relatively stable on 4/22 9. Acute hypoxic respiratory failure with Pseudomonas pneumonia. Status post tracheostomy 03/27/2019. Decannulated 04/28/2019.  Weaned supplemental oxygen  10.Acute on chronic anemia/thrombocytopenia. No more heparin for HD for now  Hemoglobin 8.7 on 4/16, labs ordered for tomorrow  Platelets 157 on 4/16, labs ordered for tomorrow  HIT negative 11. Dysphagia/nausea.   Advance to regular thins.  Follow-up speech therapy.Nasogastric tube/cortrakfor nutritional support.   Dietary follow-up- see dietary note dated 4/6 for additional information.   -PEG not safe option as per cardiothoracic surgery team, will consider follow-up with CTS if necessary.   4/9- Added Phenergan 12.5 mg Q6 hours prn for nausea since zofran doesn't workAdded scopolamine patch for nausea and dizziness.   KUB personally reviewed, bowel gas pattern.    Encourage patient to have son bring in food that patient enjoys and have it modified, reminded again  ECG reviewed, borderline QTC-trial low-dose Reglan with meals on 4/14, with improvement, DC'd on 4/19  Remeron started on 4/14  Oral intake continues to improve 12. 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 Patient has been transitioned  to dialysis and being sent to OP HD after CIR, so is chronicand followed by renal services. Tunneled L IJcatheter placed 04/24/2019, exchanged 13. Hematuria. Cystoscopy completed with evacuation of clot. No further bleeding episodes per notes, intermittent bleeding.   Continue to monitor 14. Left pleural effusion. Status post Pleurx catheter  04/23/2019.PATIENT WILL KEEP PLEURX CATHETER FOR NOW PER CVTS.Currently being drained Mondaysand Thursdays 15. Diffuse drug rashversus contact dermatitis: Resolved  Steroid taper completed   Benadryl prn, Sarna lotion, hypoallergenic sheets.  16.Rectal bleeding. 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 Follow-up GI with conservative care no further bleeding reported. 17. PAF. Patient remains normal sinus rhythm. Currently off amiodarone. No plan to resume any anticoagulation at this time until hematuria fully resolved.  Heart rate controlled on 4/22  Monitor with increased exertion 18.  Prediabetes  Monitor with increased mobility   Controlled on 4/22 19.  Leukocytosis: Resolved  Afebrile 20.  Severe hypoalbuminemia  Supplement initiated, stressed importance again  LOS: 16 days A FACE TO FACE EVALUATION WAS PERFORMED  Jesse Murphy 06/07/2019, 11:45 AM

## 2019-06-08 ENCOUNTER — Inpatient Hospital Stay (HOSPITAL_COMMUNITY): Payer: Medicare Other | Admitting: Speech Pathology

## 2019-06-08 ENCOUNTER — Inpatient Hospital Stay (HOSPITAL_COMMUNITY): Payer: Medicare Other

## 2019-06-08 ENCOUNTER — Inpatient Hospital Stay (HOSPITAL_COMMUNITY): Payer: Medicare Other | Admitting: Occupational Therapy

## 2019-06-08 LAB — IRON AND TIBC
Iron: 38 ug/dL — ABNORMAL LOW (ref 45–182)
Saturation Ratios: 21 % (ref 17.9–39.5)
TIBC: 185 ug/dL — ABNORMAL LOW (ref 250–450)
UIBC: 147 ug/dL

## 2019-06-08 LAB — GLUCOSE, CAPILLARY
Glucose-Capillary: 85 mg/dL (ref 70–99)
Glucose-Capillary: 78 mg/dL (ref 70–99)
Glucose-Capillary: 105 mg/dL — ABNORMAL HIGH (ref 70–99)
Glucose-Capillary: 96 mg/dL (ref 70–99)
Glucose-Capillary: 127 mg/dL — ABNORMAL HIGH (ref 70–99)

## 2019-06-08 LAB — FERRITIN: Ferritin: 855 ng/mL — ABNORMAL HIGH (ref 24–336)

## 2019-06-08 MED ORDER — DARBEPOETIN ALFA 150 MCG/0.3ML IJ SOSY: 150.0000 ug | PREFILLED_SYRINGE | INTRAMUSCULAR | Status: DC

## 2019-06-08 MED ORDER — ACETAMINOPHEN 325 MG PO TABS
ORAL_TABLET | ORAL | Status: AC
  Administered 2019-06-08: 325 mg
  Filled 2019-06-08: qty 1

## 2019-06-08 NOTE — Plan of Care (Signed)
  Problem: Consults Goal: RH GENERAL PATIENT EDUCATION Description: See Patient Education module for education specifics. Outcome: Progressing Goal: Diabetes Guidelines if Diabetic/Glucose > 140 Description: If diabetic or lab glucose is > 140 mg/dl - Initiate Diabetes/Hyperglycemia Guidelines & Document Interventions  Outcome: Progressing   Problem: RH BLADDER ELIMINATION Goal: RH STG MANAGE BLADDER WITH ASSISTANCE Description: STG Manage Bladder With min/mod Assistance Outcome: Progressing   Problem: RH SAFETY Goal: RH STG ADHERE TO SAFETY PRECAUTIONS W/ASSISTANCE/DEVICE Description: STG Adhere to Safety Precautions With min Assistance/Device. Outcome: Progressing   Problem: RH PAIN MANAGEMENT Goal: RH STG PAIN MANAGED AT OR BELOW PT'S PAIN GOAL Description: < 4 Outcome: Progressing

## 2019-06-08 NOTE — Progress Notes (Signed)
Admit: 05/22/2019 LOS: 46  65M ESRD after cardiogenic shock; CAD s/p CABG  Subjective:  . HD overnight TDC worked well . 2L UF . Pt sleepy this AM . P 3.8, Hb 7.9  04/22 0701 - 04/23 0700 In: 118 [P.O.:118] Out: 2000   Filed Weights   06/07/19 2250 06/08/19 0210 06/08/19 0411  Weight: 81.8 kg 79.8 kg 81.7 kg    Scheduled Meds: . aspirin  81 mg Oral Daily  . Chlorhexidine Gluconate Cloth  6 each Topical Q0600  . darbepoetin (ARANESP) injection - DIALYSIS  100 mcg Intravenous Q Wed-HD  . famotidine  10 mg Oral Daily  . feeding supplement (NEPRO CARB STEADY)  237 mL Oral BID BM  . feeding supplement (PRO-STAT SUGAR FREE 64)  30 mL Oral BID  . Gerhardt's butt cream   Topical BID  . hydrocortisone   Rectal BID  . hydrocortisone  25 mg Rectal BID  . hydrocortisone cream   Topical BID  . insulin aspart  0-15 Units Subcutaneous TID WC  . metoCLOPramide  5 mg Oral TID AC & HS  . multivitamin  1 tablet Oral QHS  . nutrition supplement (JUVEN)  1 packet Oral BID BM  . ondansetron  4 mg Oral Once  . scopolamine  1 patch Transdermal Q72H  . simethicone  80 mg Oral TID  . sodium chloride flush  10-40 mL Intracatheter Q12H   Continuous Infusions: . anticoagulant sodium citrate     PRN Meds:.oxyCODONE **AND** acetaminophen, camphor-menthol, diphenhydrAMINE, lidocaine, ondansetron, promethazine, sodium chloride, sodium chloride flush  Current Labs: reviewed   Physical Exam:  Blood pressure (!) 128/50, pulse 78, temperature 98.5 F (36.9 C), temperature source Oral, resp. rate 16, height 5\' 8"  (1.727 m), weight 81.7 kg, SpO2 96 %. NAD in chair RRR nl s1s2 CTAB L IJ TDC No rashes NCAT EOMI  A 1. ESRD, new during this admission; AKI from #2; 1. TDC dep't at this time 2. CLIP THS RKC start 5/1 2. S/p cardiogenic shock; CAD s/p CABG 3. Debility in CIR 4. Anemia, trending down,  1. Move ESA to qTues, inc dose 2. 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 Change to THS at this point: TDC, 3.5h, 3K Bath, 1-2L UF, 400/800 . For recurrent  HD cath nonfunctioning will need IR conslt for [facility] exchange . Add on Fe levels . Medication Issues; o Preferred narcotic agents for pain control are hydromorphone, fentanyl, and methadone. Morphine should not be used.  o Baclofen should be avoided o Avoid oral sodium phosphate and magnesium citrate based laxatives / bowel preps    Pearson Grippe MD 06/08/2019, 12:09 PM  Recent Labs  Lab 06/07/19 2117  NA 135  K 5.7*  CL 98  CO2 27  GLUCOSE 108*  BUN 43*  CREATININE 6.86*  CALCIUM 7.9*  PHOS 3.8   Recent Labs  Lab 06/07/19 2117  WBC 7.8  HGB 7.9*  HCT 26.8*  MCV 96.1  PLT 122*

## 2019-06-08 NOTE — Progress Notes (Addendum)
Renal Navigator spoke with CIR CM/D. Sharp to review patient's discharge plan. Plan remains for target discharge date of 06/14/19. Since patient has had a MWF HD schedule in the hospital and has been given a TTS schedule in the OP HD clinic, Navigator discussed with Nephrologist/Dr. Joelyn Oms for patient to have HD MWS next week. He is in agreement. Navigator will review this with Nephrologist next week also. This plan means patient will start at his clinic/[facility] on Saturday, 06/16/19. In order to start on a Saturday, he will need to complete intake paperwork Friday prior. Renal Navigator contact patient's son/Donald to discuss. He states understanding and reports that he may take his father by the clinic on their way home from the hospital at discharge on Thursday. Navigator notified the clinic of this possibility. Patient's OP HD seat time is 12:30pm. They need to arrive to the clinic at 12:10pm for his appointments. Navigator also informed Elenore Rota that he will be responsible for getting his father in and out of private vehicle, as clinic staff cannot do any lifting of patients. Navigator reminded him that he will need to have his hoyer lift pad under him when he arrives in his wheelchair so that he can be lifted in to the HD chair. He should bring the lift pad with him to sit on even if he is able to get in to the chair without using the lift in the event that he is too exhausted to get back up after treatment. Elenore Rota stated understanding to all.  Navigator messaged CIR CM to request that a hoyer pad be ordered for patient, even if a hoyer lift is not required for home. Navigator appreciates communication and collaboration from [employer].  Alphonzo Cruise, [place] Renal Navigator [phone]

## 2019-06-08 NOTE — Progress Notes (Signed)
Per Jesse Murphy in dialysis patient will not have dialysis today, it will be Saturday 4/24

## 2019-06-08 NOTE — Progress Notes (Signed)
Occupational Therapy Session Note  Patient Details  Name: Jesse Murphy MRN: [id number] Date of Birth: April 17, 1928  Today's Date: 06/08/2019 OT Individual Time: 0900-0950 OT Individual Time Calculation (min): 50 min    Short Term Goals: Week 3:  OT Short Term Goal 1 (Week 3): STGs = LTGs  Skilled Therapeutic Interventions/Progress Updates:    Pt seen for OT treatment this am.  Pt voicing fatigue and not getting back from dialysis until early this morning.  Chart states around 2:45 am.  He did participate some in session but with noted fatigue and sleepiness, closing eyes in sitting and at times demonstrating a jerking motion, implying that he probably was dozing off.  He needed min assist for transfer to the EOB, where he worked on eating some of his breakfast with supervision and min instructional cueing to follow his chin tuck precautions.  He the agreed to try standing from the EOB, but would not transfer to a wheelchair when asked.  Therapist provided total assist for donning and tying of his shoes.  He then completed sit to stand and static standing for intervals of 30 seconds to 1 min on 3 attempts, using the RW for support.  He also took 2-3 small steps up toward the top of the bed with use of the RW and min assist.  He finally requested to lay back down secondary to fatigue.  He was able to reach down in sitting and untie his shoes, but could not push them off.  Therapist provided max assist to remove them.  He transitioned to supine with mod assist and was left with the call button and phone in reach and safety alarm in place.  Oxygen sats 88-89% on room air after standing.  They increased to 92-93% after resting.  With 2Ls O2 they increased back up to 95%.  BP in supine 128/50.     Therapy Documentation Precautions:  Precautions Precautions: Fall Precaution Booklet Issued: Yes (comment) Precaution Comments: monitor for orthostatic hypotension Restrictions Weight Bearing Restrictions:  No RUE Weight Bearing: Weight bearing as tolerated RUE Partial Weight Bearing Percentage or Pounds: No restrictions Other Position/Activity Restrictions: no longer has sternal precautions or UE PWB precautions - confirmed with Dr. Dagoberto Ligas on 4/9   Vital Signs: Therapy Vitals Pulse Rate: 78 BP: (!) 128/50 Patient Position (if appropriate): Lying Pain: Pain Assessment Pain Scale: Faces Faces Pain Scale: Hurts little more Pain Type: Acute pain Pain Location: Back Pain Orientation: Lower Pain Descriptors / Indicators: Aching Pain Onset: With Activity Pain Intervention(s): Repositioned ADL: See Care Tool Section for some details of mobility and selfcare  Therapy/Group: Individual Therapy  Taren Toops OTR/L 06/08/2019, 9:53 AM

## 2019-06-08 NOTE — Progress Notes (Signed)
Report received from HD nurse, Salley Scarlet. Pt arrived to unit at 0245. All VS WNL. Pt resting in bed at this time, denies SOB/CP

## 2019-06-08 NOTE — Progress Notes (Signed)
[place] PHYSICAL MEDICINE & REHABILITATION PROGRESS NOTE  Subjective/Complaints: Patient seen sitting up in bed this morning.  He appears fatigued this morning.  He states he did not sleep well overnight because he returned from dialysis if 5:00 this morning.  He was seen by heart failure yesterday, notes reviewed-labs ordered, ECG ordered.  He was seen by CT surgery yesterday, notes reviewed-maintain Pleurx.  He was seen by nephrology yesterday, notes reviewed-iron studies ordered.  ROS: Denies CP, SOB, N/V/D  Objective:   DG Chest 2 View  Result Date: 06/07/2019 CLINICAL DATA:  Shortness of breath.  Chest pain. EXAM: CHEST - 2 VIEW COMPARISON:  05/22/2019. FINDINGS: Left central line and left chest tube in stable position. Prior CABG. Left atrial appendage clip in stable position. Cardiomegaly. Diffuse bilateral pulmonary interstitial prominence and bilateral pleural effusions again noted suggesting CHF. Left pleural effusion appears to have increased on today's exam. No pneumothorax. IMPRESSION: 1.  Left central line and left chest tube in stable position. 2. Prior CABG. Cardiomegaly with bilateral interstitial prominence and bilateral pleural effusions again noted suggesting CHF. Left pleural effusion appears to have increased on today's exam. Electronically Signed   By: [place]   On: 06/07/2019 08:00   Recent Labs    06/07/19 2117  WBC 7.8  HGB 7.9*  HCT 26.8*  PLT 122*   Recent Labs    06/07/19 2117  NA 135  K 5.7*  CL 98  CO2 27  GLUCOSE 108*  BUN 43*  CREATININE 6.86*  CALCIUM 7.9*    Intake/Output Summary (Last 24 hours) at 06/08/2019 1049 Last data filed at 06/08/2019 0900 Gross per 24 hour  Intake 118 ml  Output 2000 ml  Net -1882 ml     Physical Exam: Vital Signs Blood pressure (!) 128/50, pulse 78, temperature 98.5 F (36.9 C), temperature source Oral, resp. rate 16, height 5\' 8"  (1.727 m), weight 81.7 kg, SpO2 96 %. Constitutional: No distress .  Vital signs reviewed. HENT: Normocephalic.  Atraumatic. Eyes: EOMI. No discharge. Cardiovascular: No JVD. Respiratory: Normal effort.  No stridor.  + Thorax. GI: Non-distended. Skin: Warm and dry.  Right heel DTI?  Resolved. Psych: Normal mood.  Normal behavior. Musc: No edema in extremities.  No tenderness in extremities. Musc: Generalized edema, stable. Neuro: Somnolent Motor: 4 --4/5 throughout, shoulder limited, unchanged  Assessment/Plan: 1. Functional deficits secondary to debility which require 3+ hours per day of interdisciplinary therapy in a comprehensive inpatient rehab setting.  Physiatrist is providing close team supervision and 24 hour management of active medical problems listed below.  Physiatrist and rehab team continue to assess barriers to discharge/monitor patient progress toward functional and medical goals  Care Tool:  Bathing    Body parts bathed by patient: Right arm, Left arm, Chest, Abdomen, Face   Body parts bathed by helper: (UB only)     Bathing assist Assist Level: Supervision/Verbal cueing     Upper Body Dressing/Undressing Upper body dressing   What is the patient wearing?: Pull over shirt    Upper body assist Assist Level: Minimal Assistance - Patient > 75%    Lower Body Dressing/Undressing Lower body dressing      What is the patient wearing?: Incontinence brief, Pants     Lower body assist Assist for lower body dressing: Total Assistance - Patient < 25%     Toileting Toileting    Toileting assist Assist for toileting: Total Assistance - Patient < 25%     Transfers Chair/bed transfer  Transfers assist  Chair/bed transfer activity did not occur: Safety/medical concerns(nausea/dizziness/fatigue)  Chair/bed transfer assist level: Moderate Assistance - Patient 50 - 74% Chair/bed transfer assistive device: Programmer, multimedia   Ambulation assist   Ambulation activity did not occur: Safety/medical  concerns(nausea/dizziness/fatigue)  Assist level: Minimal Assistance - Patient > 75% Assistive device: Walker-rolling Max distance: 30'   Walk 10 feet activity   Assist  Walk 10 feet activity did not occur: Safety/medical concerns  Assist level: Minimal Assistance - Patient > 75% Assistive device: Walker-rolling   Walk 50 feet activity   Assist Walk 50 feet with 2 turns activity did not occur: Safety/medical concerns         Walk 150 feet activity   Assist Walk 150 feet activity did not occur: Safety/medical concerns         Walk 10 feet on uneven surface  activity   Assist Walk 10 feet on uneven surfaces activity did not occur: Safety/medical concerns         Wheelchair     Assist Will patient use wheelchair at discharge?: No(sternal precautions) Type of Wheelchair: Manual    Wheelchair assist level: Supervision/Verbal cueing Max wheelchair distance: 50 ft    Wheelchair 50 feet with 2 turns activity    Assist        Assist Level: Supervision/Verbal cueing   Wheelchair 150 feet activity     Assist          Blood pressure (!) 128/50, pulse 78, temperature 98.5 F (36.9 C), temperature source Oral, resp. rate 16, height 5\' 8"  (1.727 m), weight 81.7 kg, SpO2 96 %.  Medical Problem List and Plan: 1.Debilitysecondary to CAD status post CABG 03/15/2019. Impella device removed 04/10/2019. Sternal precautions  Continue CIR   Chest x-ray personally reviewed, showing increase effusion-maintain Pleurx as outpatient per CTS. 2. Antithrombotics: -DVT/anticoagulation:SCDs- due to hematuria -antiplatelet therapy: Aspirin 81 mg daily 3. Pain Management:Oxycodone as needed  Controlled on 4/16 4. Mood:Provide emotional support   Remeron started on 4/14 with improvement -antipsychotic agents: N/A 5. Neuropsych: This patientiscapable of making decisions on hisown behalf. 6. Skin/Wound Care:Routine skin  checks  Continue PRAFO's for bilateral lower extremities 7. Fluids/Electrolytes/Nutrition:Monitor I/Os 8. Acute systolic congestive heart failure/cardiogenic shock. Follow-up cardiology services Filed Weights   06/07/19 2250 06/08/19 0210 06/08/19 0411  Weight: 81.8 kg 79.8 kg 81.7 kg   Stable on 4/23 9. Acute hypoxic respiratory failure with Pseudomonas pneumonia. Status post tracheostomy 03/27/2019. Decannulated 04/28/2019.  Weaned supplemental oxygen  10.Acute on chronic anemia/thrombocytopenia. No more heparin for HD for now  Hemoglobin 7.9 on 4/22, labs with HD  Platelets 122 on 4/22, labs with HD  HIT negative 11. Dysphagia/nausea.   Advance to regular thins.  Follow-up speech therapy.Nasogastric tube/cortrakfor nutritional support.   Dietary follow-up- see dietary note dated 4/6 for additional information.   -PEG not safe option as per cardiothoracic surgery team, will consider follow-up with CTS if necessary.   4/9- Added Phenergan 12.5 mg Q6 hours prn for nausea since zofran doesn't workAdded scopolamine patch for nausea and dizziness.   KUB personally reviewed, bowel gas pattern.    Encourage patient to have son bring in food that patient enjoys and have it modified, reminded again  ECG reviewed, borderline QTC-trial low-dose Reglan with meals on 4/14, with improvement, DC'd on 4/19  Remeron started on 4/14  Oral intake improved 12. 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 Patient has been transitioned to dialysis and being sent to OP HD after  CIR, so is chronicand followed by renal services. Tunneled L IJcatheter placed 04/24/2019, exchanged 13. Hematuria. Cystoscopy completed with evacuation of clot. No further bleeding episodes per notes, intermittent bleeding.   Continue to monitor 14. Left pleural effusion. Status post Pleurx catheter 04/23/2019.PATIENT WILL KEEP PLEURX CATHETER FOR NOW PER CVTS.Currently being drained Mondaysand Thursdays 15. Diffuse  drug rashversus contact dermatitis: Resolved  Steroid taper completed   Benadryl prn, Sarna lotion, hypoallergenic sheets.  16.Rectal bleeding. 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 Follow-up GI with conservative care no further bleeding reported. 17. PAF. Currently off amiodarone. No plan to resume any anticoagulation at this time until hematuria fully resolved.  Normal sinus rhythm on ECG on 4/22  Heart rate controlled on 4/23  Monitor with increased exertion 18.  Prediabetes  Monitor with increased mobility   Controlled on 4/23 19.  Leukocytosis: Resolved  Afebrile 20.  Severe hypoalbuminemia  Supplement initiated, stressed importance again  LOS: 17 days A FACE TO FACE EVALUATION WAS PERFORMED  Tanaya Dunigan Lorie Phenix 06/08/2019, 10:49 AM

## 2019-06-08 NOTE — Progress Notes (Signed)
Speech Language Pathology Daily Session Note  Patient Details  Name: Jesse Murphy MRN: [id number] Date of Birth: [birth year]  Today's Date: 06/08/2019 SLP Individual Time: 1020-1100 SLP Individual Time Calculation (min): 40 min  Short Term Goals: Week 3: SLP Short Term Goal 1 (Week 3): STG=LTG due to remaining LOS  Skilled Therapeutic Interventions:   Pt was seen for skilled ST targeting goals for dysphagia.  Therapist facilitated the session with trials of thin liquids without chin tuck posture to determine readiness to d/c precaution and advance to intermittent supervision.  Pt had one delayed cough following last sips of thin liquids via straw; however, this did not appear directly correlated to intake as he had some intermittent congested/productive coughing before trials and after POs had ceased.  Recommend discharging chin tuck and advancing pt to intermittent supervision during meals.  Pt was left in bed with bed alarm set and call bell within reach.  Continue per current plan of care.    Pain Pain Assessment Pain Scale: 0-10 Pain Score: 0-No pain   Therapy/Group: Individual Therapy  Jerel Sardina, Selinda Orion 06/08/2019, 12:12 PM

## 2019-06-08 NOTE — Progress Notes (Signed)
Physical Therapy Session Note  Patient Details  Name: Jesse Murphy MRN: [id number] Date of Birth: Feb 01, 1929  Today's Date: 06/08/2019 PT Individual Time: 8756-4332 PT Individual Time Calculation (min): 29 min   Short Term Goals: Week 1:  PT Short Term Goal 1 (Week 1): Pt will perform bed<>chair transfer min assist with LRAD PT Short Term Goal 1 - Progress (Week 1): Progressing toward goal PT Short Term Goal 2 (Week 1): Pt will ambulate x 10 ft with LRAD and min assist PT Short Term Goal 2 - Progress (Week 1): Progressing toward goal PT Short Term Goal 3 (Week 1): Pt will tolerate sitting up in w/c or recliner OOB >1 hr PT Short Term Goal 3 - Progress (Week 1): Not met PT Short Term Goal 4 (Week 1): Pt will perform sit<>stand with LRAD and min assist, maintaining sternal precuations PT Short Term Goal 4 - Progress (Week 1): Progressing toward goal Week 2:  PT Short Term Goal 1 (Week 2): Pt will transfer bed<>chair min assist and LRAD PT Short Term Goal 1 - Progress (Week 2): Progressing toward goal PT Short Term Goal 2 (Week 2): Pt will perform sit<>stand with min assist and RW PT Short Term Goal 2 - Progress (Week 2): Progressing toward goal PT Short Term Goal 3 (Week 2): Pt will ambulate x 20 ft with mod assist and LRAD PT Short Term Goal 3 - Progress (Week 2): Met Week 3:  PT Short Term Goal 1 (Week 3): Pt will perform sit to stand with minA and LRAD PT Short Term Goal 2 (Week 3): Pt will perform bed ot chair transfer with minA and [place]. PT Short Term Goal 3 (Week 3): Pt will ambulate 55' with minA and LRAD.  Skilled Therapeutic Interventions/Progress Updates:    PAIN denies pain this pm  Pt initially eating lunch, therapist agreed to return later in day.  Upon second arrival pt sleeping, son in room w/pt. Pt initially refused stating "I need to sleep".  After much encouragement, pt grudgingly agreed to participate stating "Lets get this overwith!" Supine to sit on edge of bed  w/rails and hob elevated w/cga.   Pt sits w/cga while therapist dons [facility] and shoes. STS from edge of bed to RWw/mod assist, pt initially leaning moderately posteriorly requireing mod assist to achieve midline.  Once in midline Pt stood w/walker approx 58mn w/cues for posture, distractions due to pt wanting to sit.  Repeated STS x 4 total efforts w/decreased assist to min to achieve midline, progressively decreased post tendency w/transition, cues for foot placement prior to transition.   Stood 1 min, 45 sec, 45 sec, 40 sec.  Pt w/increased secretions and junky sounding cough, clear secretions produced.  STS w/mod assist and sidestepping 4 steps to head of bed.  Pt sat at edge of bed w/cga while therapist removed shoes and tedhose, pt cued to assit via LE mvmt.  Sit to side to supine w/cues for sequencing and mod assist for LEs  In supine, pt cued to assist w/centering himself on bed, min to mod assist w/task.  [facility] and pt able to flex LE and lift head in prep for mvmt.  Pt left supine w/rails up x 4, alarm set, bed in lowest position, and needs in reach.  Pt w/limited motivation but did agree to participate on his terms.  Very tired today, son relates to very late dialysis.    Therapy Documentation Precautions:  Precautions Precautions: Fall Precaution  Booklet Issued: Yes (comment) Precaution Comments: monitor for orthostatic hypotension Restrictions Weight Bearing Restrictions: No RUE Weight Bearing: Weight bearing as tolerated RUE Partial Weight Bearing Percentage or Pounds: No restrictions Other Position/Activity Restrictions: no longer has sternal precautions or UE PWB precautions - confirmed with Dr. Dagoberto Ligas on 4/9    Therapy/Group: Individual Therapy  Callie Fielding, [place] 06/08/2019, 3:01 PM

## 2019-06-09 ENCOUNTER — Inpatient Hospital Stay (HOSPITAL_COMMUNITY): Payer: Medicare Other | Admitting: Occupational Therapy

## 2019-06-09 ENCOUNTER — Inpatient Hospital Stay (HOSPITAL_COMMUNITY): Payer: Medicare Other | Admitting: Physical Therapy

## 2019-06-09 ENCOUNTER — Inpatient Hospital Stay (HOSPITAL_COMMUNITY): Payer: Medicare Other | Admitting: Speech Pathology

## 2019-06-09 LAB — GLUCOSE, CAPILLARY
Glucose-Capillary: 76 mg/dL (ref 70–99)
Glucose-Capillary: 93 mg/dL (ref 70–99)
Glucose-Capillary: 89 mg/dL (ref 70–99)
Glucose-Capillary: 102 mg/dL — ABNORMAL HIGH (ref 70–99)

## 2019-06-09 LAB — CBC
HCT: 28.3 % — ABNORMAL LOW (ref 39.0–52.0)
Hemoglobin: 8.1 g/dL — ABNORMAL LOW (ref 13.0–17.0)
MCH: 28.2 pg (ref 26.0–34.0)
MCHC: 28.6 g/dL — ABNORMAL LOW (ref 30.0–36.0)
MCV: 98.6 fL (ref 80.0–100.0)
Platelets: 122 10*3/uL — ABNORMAL LOW (ref 150–400)
RBC: 2.87 MIL/uL — ABNORMAL LOW (ref 4.22–5.81)
RDW: 18.4 % — ABNORMAL HIGH (ref 11.5–15.5)
WBC: 6 10*3/uL (ref 4.0–10.5)
nRBC: 0 % (ref 0.0–0.2)

## 2019-06-09 LAB — RENAL FUNCTION PANEL
Albumin: 2.1 g/dL — ABNORMAL LOW (ref 3.5–5.0)
Anion gap: 11 (ref 5–15)
BUN: 36 mg/dL — ABNORMAL HIGH (ref 8–23)
CO2: 26 mmol/L (ref 22–32)
Calcium: 8 mg/dL — ABNORMAL LOW (ref 8.9–10.3)
Chloride: 99 mmol/L (ref 98–111)
Creatinine, Ser: 6.24 mg/dL — ABNORMAL HIGH (ref 0.61–1.24)
GFR calc Af Amer: 8 mL/min — ABNORMAL LOW (ref >60)
GFR calc non Af Amer: 7 mL/min — ABNORMAL LOW (ref >60)
Glucose, Bld: 95 mg/dL (ref 70–99)
Phosphorus: 3.8 mg/dL (ref 2.5–4.6)
Potassium: 5.4 mmol/L — ABNORMAL HIGH (ref 3.5–5.1)
Sodium: 136 mmol/L (ref 135–145)

## 2019-06-09 LAB — HEPATITIS B SURFACE ANTIGEN: Hepatitis B Surface Ag: NONREACTIVE

## 2019-06-09 MED ORDER — SODIUM CHLORIDE 0.9 % IV SOLN
125.0000 mg | INTRAVENOUS | Status: DC
  Administered 2019-06-09: 125 mg via INTRAVENOUS
  Filled 2019-06-09: qty 10

## 2019-06-09 MED ORDER — BISACODYL 10 MG RE SUPP: 10.0000 mg | Freq: Every day | RECTAL | Status: DC | PRN

## 2019-06-09 MED ORDER — SORBITOL 70 % SOLN
30.0000 mL | Freq: Every day | Status: DC | PRN
  Administered 2019-06-09: 30 mL via ORAL
  Filled 2019-06-09: qty 30

## 2019-06-09 NOTE — Progress Notes (Signed)
Pt is A & O x4, but lethargic this morning. Pt slept with 2L O2 [place] to maintain O2 above 95%. Pt denies having pain

## 2019-06-09 NOTE — Progress Notes (Signed)
Physical Therapy Session Note  Patient Details  Name: Jesse Murphy MRN: [id number] Date of Birth: Dec 02, 1928  Today's Date: 06/09/2019   Short Term Goals: Week 3:  PT Short Term Goal 1 (Week 3): Pt will perform sit to stand with minA and LRAD PT Short Term Goal 2 (Week 3): Pt will perform bed ot chair transfer with minA and [place]. PT Short Term Goal 3 (Week 3): Pt will ambulate 37' with minA and LRAD. Week 4:     Skilled Therapeutic Interventions/Progress Updates:   Pt off unit for HD. Will re-attempt for PT treatment at later time/date provided pt is medically stable.      Therapy Documentation Precautions:  Precautions Precautions: Fall Precaution Booklet Issued: Yes (comment) Precaution Comments: monitor for orthostatic hypotension Restrictions Weight Bearing Restrictions: No RUE Weight Bearing: Weight bearing as tolerated RUE Partial Weight Bearing Percentage or Pounds: No restrictions Other Position/Activity Restrictions: no longer has sternal precautions or UE PWB precautions - confirmed with Dr. Dagoberto Ligas on 4/9 General: PT Amount of Missed Time (min): 60 Minutes PT Missed Treatment Reason: Unavailable (Comment)(off unit for dialysis)  Therapy/Group: Individual Therapy  Lorie Phenix 06/09/2019, 3:23 PM

## 2019-06-09 NOTE — Progress Notes (Signed)
[place] PHYSICAL MEDICINE & REHABILITATION PROGRESS NOTE  Subjective/Complaints:  No issues, pt was sleeping earlier but now awake, still making urine, moving bowels as well  ROS: Denies CP, SOB, N/V/D  Objective:   No results found. Recent Labs    06/07/19 2117  WBC 7.8  HGB 7.9*  HCT 26.8*  PLT 122*   Recent Labs    06/07/19 2117  NA 135  K 5.7*  CL 98  CO2 27  GLUCOSE 108*  BUN 43*  CREATININE 6.86*  CALCIUM 7.9*    Intake/Output Summary (Last 24 hours) at 06/09/2019 1023 Last data filed at 06/08/2019 1855 Gross per 24 hour  Intake 486 ml  Output --  Net 486 ml     Physical Exam: Vital Signs Blood pressure (!) 112/52, pulse 78, temperature 98.3 F (36.8 C), temperature source Oral, resp. rate 18, height 5\' 8"  (1.727 m), weight 82.1 kg, SpO2 99 %.   General: No acute distress Mood and affect are appropriate Heart: Regular rate and rhythm no rubs murmurs or extra sounds Lungs: Clear to auscultation, breathing unlabored, no rales or wheezes Abdomen: Positive bowel sounds, soft nontender to palpation, nondistended Extremities: No clubbing, cyanosis, or edema Skin: No evidence of breakdown, no evidence of rash Neurologic: Cranial nerves II through XII intact, motor strength is in bilateral deltoid, bicep, tricep, grip, hip flexor, knee extensors, ankle dorsiflexor and plantar flexor Sensory exam normal sensation to light touch and proprioception in bilateral upper and lower extremities Cerebellar exam normal finger to nose to finger as well as heel to shin in bilateral upper and lower extremities Musculoskeletal: Full range of motion in all 4 extremities. No joint swelling  Assessment/Plan: 1. Functional deficits secondary to debility which require 3+ hours per day of interdisciplinary therapy in a comprehensive inpatient rehab setting.  Physiatrist is providing close team supervision and 24 hour management of active medical problems listed  below.  Physiatrist and rehab team continue to assess barriers to discharge/monitor patient progress toward functional and medical goals  Care Tool:  Bathing    Body parts bathed by patient: Right arm, Left arm, Chest, Abdomen, Face   Body parts bathed by helper: (UB only)     Bathing assist Assist Level: Supervision/Verbal cueing     Upper Body Dressing/Undressing Upper body dressing   What is the patient wearing?: Pull over shirt    Upper body assist Assist Level: Minimal Assistance - Patient > 75%    Lower Body Dressing/Undressing Lower body dressing      What is the patient wearing?: Incontinence brief, Pants     Lower body assist Assist for lower body dressing: Total Assistance - Patient < 25%     Toileting Toileting    Toileting assist Assist for toileting: Total Assistance - Patient < 25%     Transfers Chair/bed transfer  Transfers assist  Chair/bed transfer activity did not occur: Safety/medical concerns(nausea/dizziness/fatigue)  Chair/bed transfer assist level: Moderate Assistance - Patient 50 - 74% Chair/bed transfer assistive device: Programmer, multimedia   Ambulation assist   Ambulation activity did not occur: Safety/medical concerns(nausea/dizziness/fatigue)  Assist level: Minimal Assistance - Patient > 75% Assistive device: Walker-rolling Max distance: 30'   Walk 10 feet activity   Assist  Walk 10 feet activity did not occur: Safety/medical concerns  Assist level: Minimal Assistance - Patient > 75% Assistive device: Walker-rolling   Walk 50 feet activity   Assist Walk 50 feet with 2 turns activity did not occur: Safety/medical concerns  Walk 150 feet activity   Assist Walk 150 feet activity did not occur: Safety/medical concerns         Walk 10 feet on uneven surface  activity   Assist Walk 10 feet on uneven surfaces activity did not occur: Safety/medical concerns          Wheelchair     Assist Will patient use wheelchair at discharge?: No(sternal precautions) Type of Wheelchair: Manual    Wheelchair assist level: Supervision/Verbal cueing Max wheelchair distance: 50 ft    Wheelchair 50 feet with 2 turns activity    Assist        Assist Level: Supervision/Verbal cueing   Wheelchair 150 feet activity     Assist          Blood pressure (!) 112/52, pulse 78, temperature 98.3 F (36.8 C), temperature source Oral, resp. rate 18, height 5\' 8"  (1.727 m), weight 82.1 kg, SpO2 99 %.  Medical Problem List and Plan: 1.Debilitysecondary to CAD status post CABG 03/15/2019. Impella device removed 04/10/2019. Sternal precautions  Continue CIR PT, OT  Chest x-ray  showing increase effusion-maintain Pleurx as outpatient per CTS. 2. Antithrombotics: -DVT/anticoagulation:SCDs- due to hematuria -antiplatelet therapy: Aspirin 81 mg daily 3. Pain Management:Oxycodone as needed  Controlled on 4/16 4. Mood:Provide emotional support   Remeron started on 4/14 with improvement -antipsychotic agents: N/A 5. Neuropsych: This patientiscapable of making decisions on hisown behalf. 6. Skin/Wound Care:Routine skin checks  Continue PRAFO's for bilateral lower extremities 7. Fluids/Electrolytes/Nutrition:Monitor I/Os 8. Acute systolic congestive heart failure/cardiogenic shock. Follow-up cardiology services Filed Weights   06/08/19 0210 06/08/19 0411 06/09/19 0521  Weight: 79.8 kg 81.7 kg 82.1 kg   Trending up as per renal and cardiology , has HD today expect fluctuation  9. Acute hypoxic respiratory failure with Pseudomonas pneumonia. Status post tracheostomy 03/27/2019. Decannulated 04/28/2019.  Weaned supplemental oxygen  10.Acute on chronic anemia/thrombocytopenia. No more heparin for HD for now  Hemoglobin 7.9 on 4/22, labs with HD  Platelets 122 on 4/22, labs with HD  HIT negative 11. Dysphagia/nausea.    Advance to regular thins.  Follow-up speech therapy.Nasogastric tube/cortrakfor nutritional support.   Dietary follow-up- see dietary note dated 4/6 for additional information.   -PEG not safe option as per cardiothoracic surgery team, will consider follow-up with CTS if necessary.   4/9- Added Phenergan 12.5 mg Q6 hours prn for nausea since zofran doesn't workAdded scopolamine patch for nausea and dizziness.   KUB personally reviewed, bowel gas pattern.    Encourage patient to have son bring in food that patient enjoys and have it modified, reminded again  ECG reviewed, borderline QTC-trial low-dose Reglan with meals on 4/14, with improvement, DC'd on 4/19  Remeron started on 4/14  Oral intake improved 12. 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 Patient has been transitioned to dialysis and being sent to OP HD after CIR, so is chronicand followed by renal services. Tunneled L IJcatheter placed 04/24/2019, exchanged 13. Hematuria. Cystoscopy completed with evacuation of clot. No further bleeding episodes per notes, intermittent bleeding.   Continue to monitor 14. Left pleural effusion. Status post Pleurx catheter 04/23/2019.PATIENT WILL KEEP PLEURX CATHETER FOR NOW PER CVTS.Currently being drained Mondaysand Thursdays 15. Diffuse drug rashversus contact dermatitis: Resolved  Steroid taper completed   Benadryl prn, Sarna lotion, hypoallergenic sheets.  16.Rectal bleeding. 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 Follow-up GI with conservative care no further bleeding reported. 17. PAF. Currently off amiodarone. No plan to resume any  anticoagulation at this time until hematuria fully resolved.   Vitals:   06/08/19 1927 06/09/19 0521  BP: (!) 119/50 (!) 112/52  Pulse: 83 78  Resp: 18 18  Temp: 98.4 F (36.9 C) 98.3 F (36.8 C)  SpO2: 99% 99%    Controlled rate  18.  Prediabetes  Monitor with increased mobility   Controlled on  4/23 19.  Leukocytosis: Resolved  Afebrile 20.  Severe hypoalbuminemia  Supplement initiated, stressed importance again  LOS: 18 days A FACE TO Thomson E Journei [place] 06/09/2019, 10:23 AM

## 2019-06-09 NOTE — Progress Notes (Signed)
Admit: 05/22/2019 LOS: 37  87M ESRD after cardiogenic shock; CAD s/p CABG  Subjective:  . No c/o . For HD today  04/23 0701 - 04/24 0700 In: 486 [P.O.:486] Out: -   Filed Weights   06/08/19 0210 06/08/19 0411 06/09/19 0521  Weight: 79.8 kg 81.7 kg 82.1 kg    Scheduled Meds: . aspirin  81 mg Oral Daily  . Chlorhexidine Gluconate Cloth  6 each Topical Q0600  . [START ON 06/12/2019] darbepoetin (ARANESP) injection - DIALYSIS  150 mcg Intravenous Q Tue-HD  . famotidine  10 mg Oral Daily  . feeding supplement (NEPRO CARB STEADY)  237 mL Oral BID BM  . feeding supplement (PRO-STAT SUGAR FREE 64)  30 mL Oral BID  . Gerhardt's butt cream   Topical BID  . hydrocortisone   Rectal BID  . hydrocortisone  25 mg Rectal BID  . hydrocortisone cream   Topical BID  . insulin aspart  0-15 Units Subcutaneous TID WC  . metoCLOPramide  5 mg Oral TID AC & HS  . multivitamin  1 tablet Oral QHS  . nutrition supplement (JUVEN)  1 packet Oral BID BM  . ondansetron  4 mg Oral Once  . scopolamine  1 patch Transdermal Q72H  . simethicone  80 mg Oral TID  . sodium chloride flush  10-40 mL Intracatheter Q12H   Continuous Infusions: . anticoagulant sodium citrate     PRN Meds:.oxyCODONE **AND** acetaminophen, bisacodyl, camphor-menthol, diphenhydrAMINE, lidocaine, ondansetron, promethazine, sodium chloride, sodium chloride flush, sorbitol  Current Labs: reviewed  Results for DEMARION, PONDEXTER (MRN [id number]) as of 06/09/2019 11:01  Ref. Range 06/08/2019 16:22  Saturation Ratios Latest Ref Range: 17.9 - 39.5 % 21  Ferritin Latest Ref Range: 24 - 336 ng/mL 855 (H)    Physical Exam:  Blood pressure (!) 112/52, pulse 78, temperature 98.3 F (36.8 C), temperature source Oral, resp. rate 18, height 5\' 8"  (1.727 m), weight 82.1 kg, SpO2 99 %. NAD in chair RRR nl s1s2 CTAB L IJ TDC No rashes NCAT EOMI  A 1. ESRD, new during this admission; AKI from #2; 1. TDC dep't at this time 2. CLIP THS RKC start  5/1 2. S/p cardiogenic shock; CAD s/p CABG 3. Debility in CIR 4. Anemia, trending down,  1. Move ESA to qTues, inc dose 2. 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 HD plan for today, then M/W next week, prior to treatment 5/1 THS schedule at [facility]: TDC, 3.5h, 3K Bath, 1-2L UF, 400/800 . For recurrent  HD cath nonfunctioning will need IR conslt for [facility] exchange . Cotn ESA; start 50% Fe load . Medication Issues; o Preferred narcotic agents for pain control are hydromorphone, fentanyl, and methadone. Morphine should not be used.  o Baclofen should be avoided o Avoid oral sodium phosphate and magnesium citrate based laxatives / bowel preps    Pearson Grippe MD 06/09/2019, 11:03 AM  Recent Labs  Lab 06/07/19 2117  NA 135  K 5.7*  CL 98  CO2 27  GLUCOSE 108*  BUN 43*  CREATININE 6.86*  CALCIUM 7.9*  PHOS 3.8   Recent Labs  Lab 06/07/19 2117  WBC 7.8  HGB 7.9*  HCT 26.8*  MCV 96.1  PLT 122*

## 2019-06-09 NOTE — Progress Notes (Signed)
SLP Cancellation Note  Patient Details Name: Jesse Murphy MRN: [id number] DOB: Mar 04, 1928   Cancelled treatment:   Therapy session unable to be completed as patient in the process of transferring off the floor for dialysis.  Time: [place] 06/09/2019, 1:01 PM

## 2019-06-09 NOTE — Progress Notes (Signed)
Occupational Therapy Session Note  Patient Details  Name: Jesse Murphy MRN: [id number] Date of Birth: 1928/06/10  Today's Date: 06/09/2019 OT Individual Time: 1006-1100 OT Individual Time Calculation (min): 54 min    Short Term Goals: Week 1:  OT Short Term Goal 1 (Week 1): Pt will be able to rise to stand with min - mod A to prep for clothing management with toileting. OT Short Term Goal 1 - Progress (Week 1): Progressing toward goal OT Short Term Goal 2 (Week 1): Pt will be able to squat pivot to toilet or BSC with mod- max A. OT Short Term Goal 2 - Progress (Week 1): Progressing toward goal OT Short Term Goal 3 (Week 1): Pt will don shirt with set up. OT Short Term Goal 3 - Progress (Week 1): Progressing toward goal OT Short Term Goal 4 (Week 1): Pt will don pants with mod-max A. OT Short Term Goal 4 - Progress (Week 1): Progressing toward goal Week 2:  OT Short Term Goal 1 (Week 2): Pt will rise to stand with mod A of 1 to RW to prepare for LB self care. OT Short Term Goal 1 - Progress (Week 2): Met OT Short Term Goal 2 (Week 2): Pt will complete a stand pivot to [facility] with RW with min A. OT Short Term Goal 2 - Progress (Week 2): Met OT Short Term Goal 3 (Week 2): Pt will be able to don a shirt with set up. OT Short Term Goal 3 - Progress (Week 2): Progressing toward goal OT Short Term Goal 4 (Week 2): Pt will demonstrate improved activity tolerance to stay up in his wc for at least 1 hour at a time. OT Short Term Goal 4 - Progress (Week 2): Met Week 3:  OT Short Term Goal 1 (Week 3): STGs = LTGs  Skilled Therapeutic Interventions/Progress Updates:    Pt received in bed sleeping but was willing to wake for therapy. Pt on 2L  Of O2 at 100% sat rate.  O2 removed and he mainatined 96-98% on room air.  Pt was able to sit to EOB with min A and stood with mod A to ambulate with RW around his bed with min A to sit in recliner.  From recliner, worked on UE strengthening, partial chair push  ups (as it is difficult for him to fully push up), and forward weight shifts to lift hips with max A.  His daughter arrived at end of session and educated daughter on what he has been working on with PT/OT and the challenges his has been dealing with.  Adjusted pt in recliner with feet elevated. Call light in reach.    Therapy Documentation Precautions:  Precautions Precautions: Fall Precaution Booklet Issued: Yes (comment) Precaution Comments: monitor for orthostatic hypotension Restrictions Weight Bearing Restrictions: No RUE Weight Bearing: Weight bearing as tolerated RUE Partial Weight Bearing Percentage or Pounds: No restrictions Other Position/Activity Restrictions: no longer has sternal precautions or UE PWB precautions - confirmed with Dr. Dagoberto Ligas on 4/9  General PT Missed Treatment Reason: Unavailable (Comment)(off unit for dialysis) Vital Signs: Therapy Vitals Temp: 98.1 F (36.7 C) Temp Source: Oral Pulse Rate: 79 Resp: (!) 21 BP: (!) 113/58 Patient Position (if appropriate): Lying Oxygen Therapy SpO2: 94 % O2 Device: Nasal Cannula O2 Flow Rate (L/min): 2 L/min Pain:  no c/o pain  ADL: ADL Upper Body Bathing: Setup Where Assessed-Upper Body Bathing: Edge of bed Lower Body Bathing: Maximal assistance Where Assessed-Lower Body Bathing: Edge of  bed Upper Body Dressing: Moderate assistance Where Assessed-Upper Body Dressing: Edge of bed Lower Body Dressing: Dependent Where Assessed-Lower Body Dressing: Edge of bed Toileting: Dependent Where Assessed-Toileting: Bedside Commode Toilet Transfer: Moderate assistance Toilet Transfer Method: Stand pivot Toilet Transfer Equipment: Bedside commode  Therapy/Group: Individual Therapy  [place] 06/09/2019, 4:25 PM

## 2019-06-09 NOTE — Plan of Care (Signed)
  Problem: Consults Goal: RH GENERAL PATIENT EDUCATION Description: See Patient Education module for education specifics. Outcome: Progressing Goal: Skin Care Protocol Initiated - if Braden Score 18 or less Description: If consults are not indicated, leave blank or document N/A Outcome: Progressing Goal: Nutrition Consult-if indicated Outcome: Progressing Goal: Diabetes Guidelines if Diabetic/Glucose > 140 Description: If diabetic or lab glucose is > 140 mg/dl - Initiate Diabetes/Hyperglycemia Guidelines & Document Interventions  Outcome: Progressing   Problem: RH BOWEL ELIMINATION Goal: RH STG MANAGE BOWEL WITH ASSISTANCE Description: STG Manage Bowel with min Assistance. Outcome: Progressing Goal: RH STG MANAGE BOWEL W/MEDICATION W/ASSISTANCE Description: STG Manage Bowel with Medication with min Assistance. Outcome: Progressing   Problem: RH BLADDER ELIMINATION Goal: RH STG MANAGE BLADDER WITH ASSISTANCE Description: STG Manage Bladder With min/mod Assistance Outcome: Progressing   Problem: RH SKIN INTEGRITY Goal: RH STG SKIN FREE OF INFECTION/BREAKDOWN Description: Patients skin will remain free from FURTHER breakdown or infection with min/mod assist. Outcome: Progressing Goal: RH STG MAINTAIN SKIN INTEGRITY WITH ASSISTANCE Description: STG Maintain Skin Integrity With min/mod Assistance. Outcome: Progressing Goal: RH STG ABLE TO PERFORM INCISION/WOUND CARE W/ASSISTANCE Description: STG Able To Perform Incision/Wound Care With total assistance from caregiver. Outcome: Progressing   Problem: RH SAFETY Goal: RH STG ADHERE TO SAFETY PRECAUTIONS W/ASSISTANCE/DEVICE Description: STG Adhere to Safety Precautions With min Assistance/Device. Outcome: Progressing   Problem: RH PAIN MANAGEMENT Goal: RH STG PAIN MANAGED AT OR BELOW PT'S PAIN GOAL Description: < 4 Outcome: Progressing

## 2019-06-10 ENCOUNTER — Inpatient Hospital Stay (HOSPITAL_COMMUNITY): Payer: Medicare Other | Admitting: Speech Pathology

## 2019-06-10 ENCOUNTER — Inpatient Hospital Stay (HOSPITAL_COMMUNITY): Payer: Medicare Other

## 2019-06-10 DIAGNOSIS — Z8572 Personal history of non-Hodgkin lymphomas: Secondary | ICD-10-CM

## 2019-06-10 LAB — GLUCOSE, CAPILLARY
Glucose-Capillary: 89 mg/dL (ref 70–99)
Glucose-Capillary: 131 mg/dL — ABNORMAL HIGH (ref 70–99)
Glucose-Capillary: 127 mg/dL — ABNORMAL HIGH (ref 70–99)
Glucose-Capillary: 109 mg/dL — ABNORMAL HIGH (ref 70–99)

## 2019-06-10 MED ORDER — SODIUM CHLORIDE 0.9 % IV SOLN
125.0000 mg | INTRAVENOUS | Status: DC
  Administered 2019-06-11 – 2019-06-13 (×2): 125 mg via INTRAVENOUS
  Filled 2019-06-10 (×4): qty 10

## 2019-06-10 NOTE — Progress Notes (Signed)
Admit: 05/22/2019 LOS: 10  69M ESRD after cardiogenic shock; CAD s/p CABG; in CIR.    Subjective:  . No c/o . HD yesterday went well  04/24 0701 - 04/25 0700 In: 400 [P.O.:400] Out: 2163   [facility] Weights   06/09/19 0521 06/09/19 1310 06/09/19 1647  Weight: 82.1 kg 82.1 kg 78.2 kg    Scheduled Meds: . aspirin  81 mg Oral Daily  . Chlorhexidine Gluconate Cloth  6 each Topical Q0600  . [START ON 06/12/2019] darbepoetin (ARANESP) injection - DIALYSIS  150 mcg Intravenous Q Tue-HD  . famotidine  10 mg Oral Daily  . feeding supplement (NEPRO CARB STEADY)  237 mL Oral BID BM  . feeding supplement (PRO-STAT SUGAR FREE 64)  30 mL Oral BID  . Gerhardt's butt cream   Topical BID  . hydrocortisone   Rectal BID  . hydrocortisone  25 mg Rectal BID  . hydrocortisone cream   Topical BID  . insulin aspart  0-15 Units Subcutaneous TID WC  . metoCLOPramide  5 mg Oral TID AC & HS  . multivitamin  1 tablet Oral QHS  . nutrition supplement (JUVEN)  1 packet Oral BID BM  . ondansetron  4 mg Oral Once  . scopolamine  1 patch Transdermal Q72H  . simethicone  80 mg Oral TID  . sodium chloride flush  10-40 mL Intracatheter Q12H   Continuous Infusions: . anticoagulant sodium citrate    . ferric gluconate (FERRLECIT/NULECIT) IV Stopped (06/09/19 1518)   PRN Meds:.oxyCODONE **AND** acetaminophen, bisacodyl, camphor-menthol, diphenhydrAMINE, lidocaine, ondansetron, promethazine, sodium chloride, sodium chloride flush, sorbitol  Current Labs: reviewed  Results for EBEN, CHOINSKI (MRN [id number]) as of 06/09/2019 11:01  Ref. Range 06/08/2019 16:22  Saturation Ratios Latest Ref Range: 17.9 - 39.5 % 21  Ferritin Latest Ref Range: 24 - 336 ng/mL 855 (H)    Physical Exam:  Blood pressure (!) 125/50, pulse 82, temperature 97.9 F (36.6 C), temperature source Oral, resp. rate 16, height 5\' 8"  (1.727 m), weight 78.2 kg, SpO2 98 %. NAD in chair RRR nl s1s2 CTAB L IJ TDC No rashes NCAT EOMI  A 1. ESRD,  new during this admission; AKI from #2; 1. TDC dep't at this time 2. CLIP THS RKC start 5/1 2. S/p cardiogenic shock; CAD s/p CABG 3. Debility in CIR 4. Anemia, trending down,  1. Move ESA to qTues, inc dose 2. 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 HD plan for M/W next week, prior to treatment 5/1 THS schedule at [facility]: TDC, 3.5h, 2K Bath, 1-2L UF, 400/800; no heparin; citrate lock TDC . Cotn ESA; on 50% Fe load . Medication Issues; o Preferred narcotic agents for pain control are hydromorphone, fentanyl, and methadone. Morphine should not be used.  o Baclofen should be avoided o Avoid oral sodium phosphate and magnesium citrate based laxatives / bowel preps    Pearson Grippe MD 06/10/2019, 10:10 AM  Recent Labs  Lab 06/07/19 2117 06/09/19 1336  NA 135 136  K 5.7* 5.4*  CL 98 99  CO2 27 26  GLUCOSE 108* 95  BUN 43* 36*  CREATININE 6.86* 6.24*  CALCIUM 7.9* 8.0*  PHOS 3.8 3.8   Recent Labs  Lab 06/07/19 2117 06/09/19 1335  WBC 7.8 6.0  HGB 7.9* 8.1*  HCT 26.8* 28.3*  MCV 96.1 98.6  PLT 122* 122*

## 2019-06-10 NOTE — Progress Notes (Signed)
Removed dressing on buttocks for assessment. Pt has stage II pressure ulcer in the healing stage.  Redressed wound with Butt cream w/Mepilex and turned pt to relieve pressure on sacral area.

## 2019-06-10 NOTE — Progress Notes (Signed)
Patient completed 3.5 hours of dialysis. 2.1 L removed.Post HD wt is 78 kg. Amanda Cockayne, LPN

## 2019-06-10 NOTE — Progress Notes (Signed)
Physical Therapy Session Note  Patient Details  Name: Jesse Murphy MRN: [id number] Date of Birth: [birth year]  Today's Date: 06/10/2019 PT Individual Time: 4665-9935 PT Individual Time Calculation (min): 70 min    Short Term Goals: Week 3:  PT Short Term Goal 1 (Week 3): Pt will perform sit to stand with minA and LRAD PT Short Term Goal 2 (Week 3): Pt will perform bed ot chair transfer with minA and [place]. PT Short Term Goal 3 (Week 3): Pt will ambulate 52' with minA and LRAD.  Skilled Therapeutic Interventions/Progress Updates:    Pt supine in bed upon PT arrival, agreeable to therapy tx and denies pain. Pt on 2LO2/min at rest with SpO2 99% and HR 86 bpm. Therapist donned teds and shoes total assist. Pt transferred to sitting with supervision. Sit>stand mod assist and stand pivot to w/c with RW and min assist. Therapist doffed supplemental O2, pt now on RA - SpO2 95% and HR 87 bpm. Pt transported to the dayroom, ambulated x 10 ft with RW and min assist to the nustep. SpO2 86% on RA, put back on 1L O2/min. Pt used nustep working on cardiovascular endurance and global strengthening, x 6 minutes total on workload 4 with rest breaks as needed throughout,  SpO2 89-92% throughout. Pt requiring 2 L O2/min for ambulation activities this session. Pt ambulated x 10 ft to the w/c with RW and min assist, transported to gym. In the gym pt worked on gait and endurance to ambulation x 40 ft and x 35 ft with RW and min assist, SpO2 97%.Pt performed x 5 sit<>stands from elevated mat with RW, working on strength and endurance, mod assist with cues for techniques, occasional posterior lean noted. Pt ambulating to a chair by the steps x 20 ft with RW when he reports his legs are going to give out, L LE buckles but therapist able to help him correct LOB/buckle with mod assist, and assisted to w/c. Pt reports he feels weak, decided to defer practicing steps for today. Discussed importance of trying to stay up OOB after  therapy session, pt agreeable. Pt performed seated LAQ and marches x 20 per LE and instructed to perform these exercises on his own between therapies. Pt transported back to room and left in w/c with needs in reach and chair alarm set, on 2LO2/min via [place].   Therapy Documentation Precautions:  Precautions Precautions: Fall Precaution Booklet Issued: Yes (comment) Precaution Comments: monitor for orthostatic hypotension Restrictions Weight Bearing Restrictions: No RUE Weight Bearing: Weight bearing as tolerated RUE Partial Weight Bearing Percentage or Pounds: No restrictions Other Position/Activity Restrictions: no longer has sternal precautions or UE PWB precautions - confirmed with Dr. Dagoberto Ligas on 4/9    Therapy/Group: Individual Therapy  Netta Corrigan, PT, DPT, CSRS 06/10/2019, 8:01 AM

## 2019-06-10 NOTE — Plan of Care (Signed)
  Problem: Consults Goal: RH GENERAL PATIENT EDUCATION Description: See Patient Education module for education specifics. Outcome: Progressing Goal: Skin Care Protocol Initiated - if Braden Score 18 or less Description: If consults are not indicated, leave blank or document N/A Outcome: Progressing Goal: Nutrition Consult-if indicated Outcome: Progressing Goal: Diabetes Guidelines if Diabetic/Glucose > 140 Description: If diabetic or lab glucose is > 140 mg/dl - Initiate Diabetes/Hyperglycemia Guidelines & Document Interventions  Outcome: Progressing   Problem: RH BOWEL ELIMINATION Goal: RH STG MANAGE BOWEL WITH ASSISTANCE Description: STG Manage Bowel with min Assistance. Outcome: Progressing Goal: RH STG MANAGE BOWEL W/MEDICATION W/ASSISTANCE Description: STG Manage Bowel with Medication with min Assistance. Outcome: Progressing   Problem: RH BLADDER ELIMINATION Goal: RH STG MANAGE BLADDER WITH ASSISTANCE Description: STG Manage Bladder With min/mod Assistance Outcome: Progressing   Problem: RH SKIN INTEGRITY Goal: RH STG SKIN FREE OF INFECTION/BREAKDOWN Description: Patients skin will remain free from FURTHER breakdown or infection with min/mod assist. Outcome: Progressing Goal: RH STG MAINTAIN SKIN INTEGRITY WITH ASSISTANCE Description: STG Maintain Skin Integrity With min/mod Assistance. Outcome: Progressing Goal: RH STG ABLE TO PERFORM INCISION/WOUND CARE W/ASSISTANCE Description: STG Able To Perform Incision/Wound Care With total assistance from caregiver. Outcome: Progressing   Problem: RH SAFETY Goal: RH STG ADHERE TO SAFETY PRECAUTIONS W/ASSISTANCE/DEVICE Description: STG Adhere to Safety Precautions With min Assistance/Device. Outcome: Progressing   Problem: RH PAIN MANAGEMENT Goal: RH STG PAIN MANAGED AT OR BELOW PT'S PAIN GOAL Description: < 4 Outcome: Progressing

## 2019-06-10 NOTE — Progress Notes (Signed)
Speech Language Pathology Daily Session Note  Patient Details  Name: Jesse Murphy MRN: [id number] Date of Birth: 02-10-1929  Today's Date: 06/10/2019 SLP Individual Time: 1001-1059 SLP Individual Time Calculation (min): 58 min  Short Term Goals: Week 3: SLP Short Term Goal 1 (Week 3): STG=LTG due to remaining LOS  Skilled Therapeutic Interventions: Pt was seen for skilled ST targeting dysphagia and cognitive goals. SLP provided skilled observation of pt consuming thin H2O via cup and straw with delayed cough noted X1, but did not appear directly tied to PO intake, as pt also presented with baseline intermittent congested cough without presence of any PO. Recommend continue current diet. Pt continues to require Min A verbal and visual cues to detect and correct errors, and for organization with complex aspects of medication management (primiarly interpreting how to organize 3X and 4X daily pills within a QID pill box). Discussed this is a task he has always completed independently but will need assistance with at home. Pt left laying in bed with alarm set and needs met to his satisfaction. Continue per current plan of care.        Pain Pain Assessment Pain Scale: 0-10 Pain Score: 0-No pain  Therapy/Group: Individual Therapy  Arbutus Leas 06/10/2019, 7:03 AM

## 2019-06-10 NOTE — Progress Notes (Signed)
[place] PHYSICAL MEDICINE & REHABILITATION PROGRESS NOTE  Subjective/Complaints:  No issues, pt was sleeping earlier but now awake, still making urine, moving bowels as well  ROS: Denies CP, SOB, N/V/D  Objective:   No results found. Recent Labs    06/07/19 2117 06/09/19 1335  WBC 7.8 6.0  HGB 7.9* 8.1*  HCT 26.8* 28.3*  PLT 122* 122*   Recent Labs    06/07/19 2117 06/09/19 1336  NA 135 136  K 5.7* 5.4*  CL 98 99  CO2 27 26  GLUCOSE 108* 95  BUN 43* 36*  CREATININE 6.86* 6.24*  CALCIUM 7.9* 8.0*    Intake/Output Summary (Last 24 hours) at 06/10/2019 0923 Last data filed at 06/09/2019 1647 Gross per 24 hour  Intake 300 ml  Output 2163 ml  Net -1863 ml     Physical Exam: Vital Signs Blood pressure (!) 125/50, pulse 82, temperature 97.9 F (36.6 C), temperature source Oral, resp. rate 16, height 5\' 8"  (1.727 m), weight 78.2 kg, SpO2 98 %.   General: No acute distress Mood and affect are appropriate Heart: Regular rate and rhythm no rubs murmurs or extra sounds Lungs: Clear to auscultation, breathing unlabored, no rales or wheezes Abdomen: Positive bowel sounds, soft nontender to palpation, nondistended Extremities: No clubbing, cyanosis, or edema Skin: No evidence of breakdown, no evidence of rash Neurologic: Cranial nerves II through XII intact, motor strength is in bilateral deltoid, bicep, tricep, grip, hip flexor, knee extensors, ankle dorsiflexor and plantar flexor Sensory exam normal sensation to light touch and proprioception in bilateral upper and lower extremities Cerebellar exam normal finger to nose to finger as well as heel to shin in bilateral upper and lower extremities Musculoskeletal: Full range of motion in all 4 extremities. No joint swelling  Assessment/Plan: 1. Functional deficits secondary to debility which require 3+ hours per day of interdisciplinary therapy in a comprehensive inpatient rehab setting.  Physiatrist is providing close  team supervision and 24 hour management of active medical problems listed below.  Physiatrist and rehab team continue to assess barriers to discharge/monitor patient progress toward functional and medical goals  Care Tool:  Bathing    Body parts bathed by patient: Right arm, Left arm, Chest, Abdomen, Face   Body parts bathed by helper: (UB only)     Bathing assist Assist Level: Supervision/Verbal cueing     Upper Body Dressing/Undressing Upper body dressing   What is the patient wearing?: Pull over shirt    Upper body assist Assist Level: Minimal Assistance - Patient > 75%    Lower Body Dressing/Undressing Lower body dressing      What is the patient wearing?: Incontinence brief, Pants     Lower body assist Assist for lower body dressing: Total Assistance - Patient < 25%     Toileting Toileting    Toileting assist Assist for toileting: Total Assistance - Patient < 25%     Transfers Chair/bed transfer  Transfers assist  Chair/bed transfer activity did not occur: Safety/medical concerns(nausea/dizziness/fatigue)  Chair/bed transfer assist level: Moderate Assistance - Patient 50 - 74% Chair/bed transfer assistive device: Programmer, multimedia   Ambulation assist   Ambulation activity did not occur: Safety/medical concerns(nausea/dizziness/fatigue)  Assist level: Minimal Assistance - Patient > 75% Assistive device: Walker-rolling Max distance: 30'   Walk 10 feet activity   Assist  Walk 10 feet activity did not occur: Safety/medical concerns  Assist level: Minimal Assistance - Patient > 75% Assistive device: Walker-rolling   Walk 50  feet activity   Assist Walk 50 feet with 2 turns activity did not occur: Safety/medical concerns         Walk 150 feet activity   Assist Walk 150 feet activity did not occur: Safety/medical concerns         Walk 10 feet on uneven surface  activity   Assist Walk 10 feet on uneven surfaces  activity did not occur: Safety/medical concerns         Wheelchair     Assist Will patient use wheelchair at discharge?: No(sternal precautions) Type of Wheelchair: Manual    Wheelchair assist level: Supervision/Verbal cueing Max wheelchair distance: 50 ft    Wheelchair 50 feet with 2 turns activity    Assist        Assist Level: Supervision/Verbal cueing   Wheelchair 150 feet activity     Assist          Blood pressure (!) 125/50, pulse 82, temperature 97.9 F (36.6 C), temperature source Oral, resp. rate 16, height 5\' 8"  (1.727 m), weight 78.2 kg, SpO2 98 %.  Medical Problem List and Plan: 1.Debilitysecondary to CAD status post CABG 03/15/2019. Impella device removed 04/10/2019. Sternal precautions  Continue CIR PT, OT  Chest x-ray  showing increase effusion-maintain Pleurx as outpatient per CTS. 2. Antithrombotics: -DVT/anticoagulation:SCDs- due to hematuria -antiplatelet therapy: Aspirin 81 mg daily 3. Pain Management:Oxycodone as needed  Controlled on 4/16 4. Mood:Provide emotional support   Remeron started on 4/14 with improvement -antipsychotic agents: N/A 5. Neuropsych: This patientiscapable of making decisions on hisown behalf. 6. Skin/Wound Care:Routine skin checks  Continue PRAFO's for bilateral lower extremities 7. Fluids/Electrolytes/Nutrition:Monitor I/Os 8. Acute systolic congestive heart failure/cardiogenic shock. Follow-up cardiology services Filed Weights   06/09/19 0521 06/09/19 1310 06/09/19 1647  Weight: 82.1 kg 82.1 kg 78.2 kg   Trending up as per renal and cardiology , has HD today expect fluctuation  9. Acute hypoxic respiratory failure with Pseudomonas pneumonia. Status post tracheostomy 03/27/2019. Decannulated 04/28/2019.  Weaned supplemental oxygen  10.Acute on chronic anemia/thrombocytopenia. No more heparin for HD for now  Hemoglobin 7.9 on 4/22, labs with HD  Platelets 122 on  4/22, labs with HD  HIT negative 11. Dysphagia/nausea.   Advance to regular thins.  Follow-up speech therapy.Nasogastric tube/cortrakfor nutritional support.   Dietary follow-up- see dietary note dated 4/6 for additional information.   -PEG not safe option as per cardiothoracic surgery team, will consider follow-up with CTS if necessary.   4/9- Added Phenergan 12.5 mg Q6 hours prn for nausea since zofran doesn't workAdded scopolamine patch for nausea and dizziness.   KUB personally reviewed, bowel gas pattern.    Encourage patient to have son bring in food that patient enjoys and have it modified, reminded again  ECG reviewed, borderline QTC-trial low-dose Reglan with meals on 4/14, with improvement, DC'd on 4/19  Remeron started on 4/14  Oral intake improved 12. 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 Patient has been transitioned to dialysis and being sent to OP HD after CIR, so is chronicand followed by renal services. Tunneled L IJcatheter placed 04/24/2019, exchanged 13. Hematuria. Cystoscopy completed with evacuation of clot. No further bleeding episodes per notes, intermittent bleeding.   Continue to monitor 14. Left pleural effusion. Status post Pleurx catheter 04/23/2019.PATIENT WILL KEEP PLEURX CATHETER FOR NOW PER CVTS.Currently being drained Mondaysand Thursdays 15. Diffuse drug rashversus contact dermatitis: Resolved  Steroid taper completed   Benadryl prn, Sarna lotion, hypoallergenic sheets.  16.Rectal bleeding. 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 Follow-up GI with conservative care no further bleeding reported. -Hgb Stable  17. PAF. Currently off amiodarone. No plan to resume any anticoagulation at this time until hematuria fully resolved.   Vitals:   06/09/19 1936 06/10/19 0511  BP: (!) 110/44 (!) 125/50  Pulse: 83 82  Resp: 16 16  Temp: 97.7 F (36.5 C) 97.9 F (36.6 C)  SpO2: 98%     Controlled rate   18.  Prediabetes  Monitor with increased mobility   Controlled on 4/23 19.  Leukocytosis: Resolved  Afebrile 20.  Severe hypoalbuminemia  Supplement initiated, stressed importance again  LOS: 19 days A FACE TO [place] E Preet Perrier 06/10/2019, 9:22 AM

## 2019-06-11 ENCOUNTER — Inpatient Hospital Stay (HOSPITAL_COMMUNITY): Payer: Medicare Other | Admitting: Speech Pathology

## 2019-06-11 ENCOUNTER — Inpatient Hospital Stay (HOSPITAL_COMMUNITY): Payer: Medicare Other

## 2019-06-11 ENCOUNTER — Inpatient Hospital Stay (HOSPITAL_COMMUNITY): Payer: Medicare Other | Admitting: Occupational Therapy

## 2019-06-11 LAB — RENAL FUNCTION PANEL
Albumin: 2.1 g/dL — ABNORMAL LOW (ref 3.5–5.0)
Anion gap: 11 (ref 5–15)
BUN: 18 mg/dL (ref 8–23)
CO2: 26 mmol/L (ref 22–32)
Calcium: 7.9 mg/dL — ABNORMAL LOW (ref 8.9–10.3)
Chloride: 99 mmol/L (ref 98–111)
Creatinine, Ser: 4.41 mg/dL — ABNORMAL HIGH (ref 0.61–1.24)
GFR calc Af Amer: 13 mL/min — ABNORMAL LOW (ref >60)
GFR calc non Af Amer: 11 mL/min — ABNORMAL LOW (ref >60)
Glucose, Bld: 143 mg/dL — ABNORMAL HIGH (ref 70–99)
Phosphorus: 2.4 mg/dL — ABNORMAL LOW (ref 2.5–4.6)
Potassium: 4.6 mmol/L (ref 3.5–5.1)
Sodium: 136 mmol/L (ref 135–145)

## 2019-06-11 LAB — CBC
HCT: 26.3 % — ABNORMAL LOW (ref 39.0–52.0)
Hemoglobin: 7.8 g/dL — ABNORMAL LOW (ref 13.0–17.0)
MCH: 29.2 pg (ref 26.0–34.0)
MCHC: 29.7 g/dL — ABNORMAL LOW (ref 30.0–36.0)
MCV: 98.5 fL (ref 80.0–100.0)
Platelets: 152 10*3/uL (ref 150–400)
RBC: 2.67 MIL/uL — ABNORMAL LOW (ref 4.22–5.81)
RDW: 18.9 % — ABNORMAL HIGH (ref 11.5–15.5)
WBC: 6.3 10*3/uL (ref 4.0–10.5)
nRBC: 0 % (ref 0.0–0.2)

## 2019-06-11 LAB — GLUCOSE, CAPILLARY
Glucose-Capillary: 93 mg/dL (ref 70–99)
Glucose-Capillary: 104 mg/dL — ABNORMAL HIGH (ref 70–99)
Glucose-Capillary: 98 mg/dL (ref 70–99)
Glucose-Capillary: 143 mg/dL — ABNORMAL HIGH (ref 70–99)

## 2019-06-11 MED ORDER — DARBEPOETIN ALFA 150 MCG/0.3ML IJ SOSY
150.0000 ug | PREFILLED_SYRINGE | INTRAMUSCULAR | Status: DC
  Filled 2019-06-11: qty 0.3

## 2019-06-11 MED ORDER — ALTEPLASE 2 MG IJ SOLR
INTRAMUSCULAR | Status: AC
  Filled 2019-06-11: qty 4

## 2019-06-11 MED ORDER — ALTEPLASE 2 MG IJ SOLR
4.0000 mg | INTRAMUSCULAR | Status: AC
  Filled 2019-06-11: qty 4

## 2019-06-11 MED ORDER — ALTEPLASE 2 MG IJ SOLR
INTRAMUSCULAR | Status: AC
  Administered 2019-06-11: 3.8 mg
  Filled 2019-06-11: qty 2

## 2019-06-11 MED ORDER — ALTEPLASE 2 MG IJ SOLR
4.0000 mg | Freq: Once | INTRAMUSCULAR | Status: AC
  Administered 2019-06-11: 3.8 mg

## 2019-06-11 NOTE — Progress Notes (Signed)
[facility] PHYSICAL MEDICINE & REHABILITATION PROGRESS NOTE  Subjective/Complaints: Patient seen sitting up in bed this morning.  He states he slept well overnight.  Discussed with nursing, patient is requiring supplemental oxygen again.  ROS: Denies CP, SOB, N/V/D  Objective:   No results found. Recent Labs    06/09/19 1335  WBC 6.0  HGB 8.1*  HCT 28.3*  PLT 122*   Recent Labs    06/09/19 1336  NA 136  K 5.4*  CL 99  CO2 26  GLUCOSE 95  BUN 36*  CREATININE 6.24*  CALCIUM 8.0*    Intake/Output Summary (Last 24 hours) at 06/11/2019 1026 Last data filed at 06/10/2019 1457 Gross per 24 hour  Intake 100 ml  Output --  Net 100 ml     Physical Exam: Vital Signs Blood pressure (!) 93/49, pulse 82, temperature 98.3 F (36.8 C), temperature source Oral, resp. rate 16, height 5\' 8"  (1.727 m), weight 80.3 kg, SpO2 97 %. Constitutional: No distress . Vital signs reviewed. HENT: Normocephalic.  Atraumatic. Eyes: EOMI. No discharge. Cardiovascular: No JVD. Respiratory: Normal effort.  No stridor. GI: Non-distended. Skin: Warm and dry.  Intact. Psych: Normal mood.  Normal behavior. Musc: No edema in extremities.  No tenderness in extremities. Neurologic: Alert Motor: 4/5 throughout  Assessment/Plan: 1. Functional deficits secondary to debility which require 3+ hours per day of interdisciplinary therapy in a comprehensive inpatient rehab setting.  Physiatrist is providing close team supervision and 24 hour management of active medical problems listed below.  Physiatrist and rehab team continue to assess barriers to discharge/monitor patient progress toward functional and medical goals  Care Tool:  Bathing    Body parts bathed by patient: Right arm, Left arm, Chest, Abdomen, Face   Body parts bathed by helper: (UB only)     Bathing assist Assist Level: Supervision/Verbal cueing     Upper Body Dressing/Undressing Upper body dressing   What is the patient  wearing?: Pull over shirt    Upper body assist Assist Level: Minimal Assistance - Patient > 75%    Lower Body Dressing/Undressing Lower body dressing      What is the patient wearing?: Incontinence brief, Pants     Lower body assist Assist for lower body dressing: Total Assistance - Patient < 25%     Toileting Toileting    Toileting assist Assist for toileting: Total Assistance - Patient < 25%     Transfers Chair/bed transfer  Transfers assist  Chair/bed transfer activity did not occur: Safety/medical concerns(nausea/dizziness/fatigue)  Chair/bed transfer assist level: Minimal Assistance - Patient > 75% Chair/bed transfer assistive device: Programmer, multimedia   Ambulation assist   Ambulation activity did not occur: Safety/medical concerns(nausea/dizziness/fatigue)  Assist level: Minimal Assistance - Patient > 75% Assistive device: Walker-rolling Max distance: 40 ft   Walk 10 feet activity   Assist  Walk 10 feet activity did not occur: Safety/medical concerns  Assist level: Minimal Assistance - Patient > 75% Assistive device: Walker-rolling   Walk 50 feet activity   Assist Walk 50 feet with 2 turns activity did not occur: Safety/medical concerns         Walk 150 feet activity   Assist Walk 150 feet activity did not occur: Safety/medical concerns         Walk 10 feet on uneven surface  activity   Assist Walk 10 feet on uneven surfaces activity did not occur: Safety/medical concerns         Wheelchair  Assist Will patient use wheelchair at discharge?: No(sternal precautions) Type of Wheelchair: Manual    Wheelchair assist level: Supervision/Verbal cueing Max wheelchair distance: 50 ft    Wheelchair 50 feet with 2 turns activity    Assist        Assist Level: Supervision/Verbal cueing   Wheelchair 150 feet activity     Assist          Blood pressure (!) 93/49, pulse 82, temperature 98.3 F  (36.8 C), temperature source Oral, resp. rate 16, height 5\' 8"  (1.727 m), weight 80.3 kg, SpO2 97 %.  Medical Problem List and Plan: 1.Debilitysecondary to CAD status post CABG 03/15/2019. Impella device removed 04/10/2019. Sternal precautions  Continue CIR  Chest x-ray  showing increase effusion-maintain Pleurx as outpatient per CTS. 2. Antithrombotics: -DVT/anticoagulation:SCDs- due to hematuria -antiplatelet therapy: Aspirin 81 mg daily 3. Pain Management:Oxycodone as needed  Controlled on 4/16 4. Mood:Provide emotional support   Remeron started on 4/14 with improvement -antipsychotic agents: N/A 5. Neuropsych: This patientiscapable of making decisions on hisown behalf. 6. Skin/Wound Care:Routine skin checks  Continue PRAFO's for bilateral lower extremities 7. Fluids/Electrolytes/Nutrition:Monitor I/Os 8. Acute systolic congestive heart failure/cardiogenic shock. Follow-up cardiology services Filed Weights   06/09/19 1310 06/09/19 1647 06/11/19 0600  Weight: 82.1 kg 78.2 kg 80.3 kg   Relatively stable on 4/26 9. Acute hypoxic respiratory failure with Pseudomonas pneumonia. Status post tracheostomy 03/27/2019. Decannulated 04/28/2019.  Requiring supplemental oxygen again 10.Acute on chronic anemia/thrombocytopenia. No more heparin for HD for now  Hemoglobin 8.1 on 4/24, labs with HD  Platelets 122 on 4/24, labs with HD  HIT negative 11. Dysphagia/nausea.   Advance to regular thins.  Follow-up speech therapy.Nasogastric tube/cortrakfor nutritional support.   Dietary follow-up- see dietary note dated 4/6 for additional information.   -PEG not safe option as per cardiothoracic surgery team, will consider follow-up with CTS if necessary.   4/9- Added Phenergan 12.5 mg Q6 hours prn for nausea since zofran doesn't workAdded scopolamine patch for nausea and dizziness.   KUB personally reviewed, bowel gas pattern.    Encourage patient to  have son bring in food that patient enjoys and have it modified, reminded again  ECG reviewed, borderline QTC-trial low-dose Reglan with meals on 4/14, with improvement, DC'd on 4/19  Remeron started on 4/14  Was improving, limited p.o. intake versus no documentation of the weekend 12. 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 Patient has been transitioned to dialysis and being sent to OP HD after CIR, so is chronicand followed by renal services. Tunneled L IJcatheter placed 04/24/2019, exchanged 13. Hematuria. Cystoscopy completed with evacuation of clot. No further bleeding episodes per notes, intermittent bleeding.   Continue to monitor 14. Left pleural effusion. Status post Pleurx catheter 04/23/2019.PATIENT WILL KEEP PLEURX CATHETER FOR NOW PER CVTS.Currently being drained Mondaysand Thursdays 15. Diffuse drug rashversus contact dermatitis: Resolved  Steroid taper completed   Benadryl prn, Sarna lotion, hypoallergenic sheets.  16.Rectal bleeding. 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 Follow-up GI with conservative care no further bleeding reported.   See #10 17. PAF. Currently off amiodarone. No plan to resume any anticoagulation at this time until hematuria fully resolved.   Vitals:   06/11/19 0444 06/11/19 0926  BP: (!) 130/55 (!) 93/49  Pulse: 87 82  Resp: 16   Temp: 98.3 F (36.8 C)   SpO2: 97%     Rate controlled on 4/26 18.  Prediabetes  Monitor with increased mobility   Controlled  on 4/26 19.  Leukocytosis: Resolved  Afebrile 20.  Severe hypoalbuminemia  Supplement initiated, stressed importance again  LOS: 20 days A FACE TO FACE EVALUATION WAS PERFORMED  Laird Runnion Lorie Phenix 06/11/2019, 10:26 AM

## 2019-06-11 NOTE — Progress Notes (Signed)
Occupational Therapy Session Note  Patient Details  Name: Jesse Murphy MRN: [id number] Date of Birth: April 29, 1928  Today's Date: 06/11/2019 OT Individual Time: 1115-1200 OT Individual Time Calculation (min): 45 min    Short Term Goals: Week 2:  OT Short Term Goal 1 (Week 2): Pt will rise to stand with mod A of 1 to RW to prepare for LB self care. OT Short Term Goal 1 - Progress (Week 2): Met OT Short Term Goal 2 (Week 2): Pt will complete a stand pivot to [facility] with RW with min A. OT Short Term Goal 2 - Progress (Week 2): Met OT Short Term Goal 3 (Week 2): Pt will be able to don a shirt with set up. OT Short Term Goal 3 - Progress (Week 2): Progressing toward goal OT Short Term Goal 4 (Week 2): Pt will demonstrate improved activity tolerance to stay up in his wc for at least 1 hour at a time. OT Short Term Goal 4 - Progress (Week 2): Met Week 3:  OT Short Term Goal 1 (Week 3): STGs = LTGs  Skilled Therapeutic Interventions/Progress Updates:    Pt received in wc agreeable to therapy. Pt on 2L of O2.  He agreed to work on b/d with a focus on activity tolerance and sit to stand skills.   Pt's O2 removed and he completed all UB bathing with set up and dressing with min A. On room air, O2 94%.  Because he was going to continue to work on standing, replaced oxygen to avoid over fatigue. He continues to need cues to bring feet under knees prior to standing and to lean forward, but he was able to rise to stand with min A 2x and maintain standing balance with min A to pull pants down and up hips.  He tends to lock out his knees which inhibits his balance control. He continues to need A with doffing and donning pants over feet.    Pt then worked on [employer] strengthening exercises with yellow theraband.  Tricep/shoulder presses at chest level and B arm horizontal abduction. Pt needs cues for technique but was able to complete 2 sets of 10 reps each ex.    Pt resting in wc with belt alarm on and all needs  met.   Therapy Documentation Precautions:  Precautions Precautions: Fall Precaution Booklet Issued: Yes (comment) Precaution Comments: monitor for orthostatic hypotension Restrictions Weight Bearing Restrictions: No RUE Weight Bearing: Weight bearing as tolerated RUE Partial Weight Bearing Percentage or Pounds: No restrictions Other Position/Activity Restrictions: no longer has sternal precautions or UE PWB precautions - confirmed with Dr. Dagoberto Ligas on 4/9    Vital Signs: Therapy Vitals Pulse Rate: 82 BP: (!) 93/49 Patient Position (if appropriate): Sitting Pain: Pain Assessment Pain Scale: Faces Faces Pain Scale: No hurt ADL: ADL Upper Body Bathing: Setup Where Assessed-Upper Body Bathing: Edge of bed Lower Body Bathing: Maximal assistance Where Assessed-Lower Body Bathing: Edge of bed Upper Body Dressing: Moderate assistance Where Assessed-Upper Body Dressing: Edge of bed Lower Body Dressing: Dependent Where Assessed-Lower Body Dressing: Edge of bed Toileting: Dependent Where Assessed-Toileting: Bedside Commode Toilet Transfer: Moderate assistance Toilet Transfer Method: Stand pivot Toilet Transfer Equipment: Bedside commode   Therapy/Group: Individual Therapy  [place] 06/11/2019, 10:16 AM

## 2019-06-11 NOTE — Progress Notes (Signed)
HD treatment pause because HD cath not working, MD notified, cathflo ordered. Cathflo placed waiting for 30 min to resume treatment. We continue to monitor.

## 2019-06-11 NOTE — Progress Notes (Signed)
Patient to dialysis at approximately 1320. Report given to [facility]

## 2019-06-11 NOTE — Progress Notes (Signed)
Resting at interval throughout shift,no acute distress, coloration adequate,prn administered x1 with relief, repositioned for comfort, Anticipate dialysis today, monitor and assisted, refer to assessment data sheet

## 2019-06-11 NOTE — Progress Notes (Signed)
HD treatment resumed.

## 2019-06-11 NOTE — Progress Notes (Signed)
Physical Therapy Session Note  Patient Details  Name: Jesse Murphy MRN: [id number] Date of Birth: 02-Oct-1928  Today's Date: 06/11/2019 PT Individual Time: 4650-3546 and [phone] PT Individual Time Calculation (min): 16 min and 62 min  Short Term Goals: Week 3:  PT Short Term Goal 1 (Week 3): Pt will perform sit to stand with minA and LRAD PT Short Term Goal 2 (Week 3): Pt will perform bed ot chair transfer with minA and [place]. PT Short Term Goal 3 (Week 3): Pt will ambulate 24' with minA and LRAD.  Skilled Therapeutic Interventions/Progress Updates:     1st Session: Pt received supine in bed and agreeable to therapy. Denies pain. Nasal cannula is around pt's neck but not donned, so pt on RA initially. O2 sats 85%. PT assist with donning nasal cannula and pt then 96% on 2L. Supine>sit with supervision and use of bed features. PT dons ted hose and shoes. Pt performs x4 reps sit to stand from elevated EOB with modA/mindA and cues for anterior weight shift, hand placement, and use of momentum to help with transfer. Pt then performs additional sit to stand and stand step transfer to [facility] with minA and verbal cues for positioning and hand placement for safety. Pt left seated in WC with ST present for session.  2nd session: Pt received seated in WC and agreeable to therapy. Denies pain. WC transport to therapy gym for energy conservation. Pt taken to steps for stair training. Pt performs sit to stand with modA and up/down x1 6" step x2 reps with BHRs and PT providing modA. Pt demos slight buckle in L knee when attempting to bring RLE up to step.  Pt then taken to // bars for block training for step ups. Pt performs with 4" exercise platform. x3 bouts of x3 step ups, alternating leading with LLE and RLE and PT providing minA. Then performs x3 bouts of x4 step ups. Extended seated rest breaks required between each bout. Pt remains forward flexed through much of step training and does not achieve full hip  extension.  WC transport to dayroom. Pt performs ther-ex with light orange theraband 1x10 BLE LAQs, 2x10 Hamstring curls and 2x10 seated clamshells. Pt performs stand step transfer to [facility] with minA and performs 5:00 at workload of 4 with PT cues for full ROM and energy conservation.  Stand step transfer back to [facility] and pt left seated in [facility] with alarm intact and all needs within reach.    Therapy Documentation Precautions:  Precautions Precautions: Fall Precaution Booklet Issued: Yes (comment) Precaution Comments: monitor for orthostatic hypotension Restrictions Weight Bearing Restrictions: No RUE Weight Bearing: Weight bearing as tolerated RUE Partial Weight Bearing Percentage or Pounds: No restrictions Other Position/Activity Restrictions: no longer has sternal precautions or UE PWB precautions - confirmed with Dr. Dagoberto Ligas on 4/9    Therapy/Group: Individual Therapy  Breck Coons 06/11/2019, 11:10 AM

## 2019-06-11 NOTE — Progress Notes (Addendum)
Patient ID: Jesse Murphy, male   DOB: Dec 14, 1928, 86 y.o.   MRN: [id number] f    Advanced Heart Failure Rounding Note  PCP-Cardiologist: Kate Sable, MD   Subjective:    Feeling much better. Planning to go home Thursday.   Denies chest pain.   Objective:   Weight Range: 80.3 kg Body mass index is 26.92 kg/m.   Vital Signs:   Temp:  [97.1 F (36.2 C)-98.3 F (36.8 C)] 98.3 F (36.8 C) (04/26 0444) Pulse Rate:  [82-87] 82 (04/26 0926) Resp:  [16-20] 16 (04/26 0444) BP: (93-130)/(37-55) 93/49 (04/26 0926) SpO2:  [97 %-100 %] 97 % (04/26 0444) Weight:  [80.3 kg] 80.3 kg (04/26 0600) Last BM Date: 06/09/19  Weight change: Filed Weights   06/09/19 1310 06/09/19 1647 06/11/19 0600  Weight: 82.1 kg 78.2 kg 80.3 kg    Intake/Output:   Intake/Output Summary (Last 24 hours) at 06/11/2019 1237 Last data filed at 06/10/2019 1457 Gross per 24 hour  Intake 100 ml  Output --  Net 100 ml     Physical Exam   General:  Sitting in the wheel chair.  No resp difficulty HEENT: normal anicteric Neck: supple. no JVD. Carotids 2+ bilat; no bruits. No lymphadenopathy or thryomegaly appreciated. Cor: PMI nondisplaced. Regular rate & rhythm. No rubs, gallops or murmurs. Left sided HD catheter. Sternal scar Lungs: clear had left pleurex catheter  No wheeze Abdomen: soft, nontender, nondistended. No hepatosplenomegaly. No bruits or masses. Good bowel sounds. Extremities: no cyanosis, clubbing, rash, edema Neuro: alert & oriented x 3, cranial nerves grossly intact. moves all 4 extremities w/o difficulty. Affect pleasant    Labs    CBC Recent Labs    06/09/19 1335  WBC 6.0  HGB 8.1*  HCT 28.3*  MCV 98.6  PLT 197*   Basic Metabolic Panel Recent Labs    06/09/19 1336  NA 136  K 5.4*  CL 99  CO2 26  GLUCOSE 95  BUN 36*  CREATININE 6.24*  CALCIUM 8.0*  PHOS 3.8   Liver Function Tests Recent Labs    06/09/19 1336  ALBUMIN 2.1*   No results for input(s):  LIPASE, AMYLASE in the last 72 hours. Cardiac Enzymes No results for input(s): CKTOTAL, CKMB, CKMBINDEX, TROPONINI in the last 72 hours.  BNP: BNP (last 3 results) Recent Labs    03/22/19 0401 03/26/19 0626 03/27/19 1255  BNP 340.4* 687.5* 800.4*    ProBNP (last 3 results) No results for input(s): PROBNP in the last 8760 hours.   D-Dimer No results for input(s): DDIMER in the last 72 hours. Hemoglobin A1C No results for input(s): HGBA1C in the last 72 hours. Fasting Lipid Panel No results for input(s): CHOL, HDL, LDLCALC, TRIG, CHOLHDL, LDLDIRECT in the last 72 hours. Thyroid Function Tests No results for input(s): TSH, T4TOTAL, T3FREE, THYROIDAB in the last 72 hours.  Invalid input(s): FREET3  Other results:   Imaging    No results found.   Medications:     Scheduled Medications: . aspirin  81 mg Oral Daily  . Chlorhexidine Gluconate Cloth  6 each Topical Q0600  . [START ON 06/13/2019] darbepoetin (ARANESP) injection - DIALYSIS  150 mcg Intravenous Q Wed-HD  . famotidine  10 mg Oral Daily  . feeding supplement (NEPRO CARB STEADY)  237 mL Oral BID BM  . feeding supplement (PRO-STAT SUGAR FREE 64)  30 mL Oral BID  . Gerhardt's butt cream   Topical BID  . hydrocortisone   Rectal BID  .  hydrocortisone  25 mg Rectal BID  . hydrocortisone cream   Topical BID  . insulin aspart  0-15 Units Subcutaneous TID WC  . metoCLOPramide  5 mg Oral TID AC & HS  . multivitamin  1 tablet Oral QHS  . nutrition supplement (JUVEN)  1 packet Oral BID BM  . ondansetron  4 mg Oral Once  . scopolamine  1 patch Transdermal Q72H  . simethicone  80 mg Oral TID  . sodium chloride flush  10-40 mL Intracatheter Q12H    Infusions: . anticoagulant sodium citrate    . ferric gluconate (FERRLECIT/NULECIT) IV      PRN Medications: oxyCODONE **AND** acetaminophen, bisacodyl, camphor-menthol, diphenhydrAMINE, lidocaine, ondansetron, promethazine, sodium chloride, sodium chloride flush,  sorbitol    Assessment/Plan   1. Acute systolic HF -> Cardiogenic shock - post-op echo on 03/20/19 EF 25-30% (pre-op 30-35%. In 12/2017 EF normal) - Required Impella support post CABG. Impella removed 2/23. Post-op TEE EF 40% - Has developed ESRD. Volume controlled through HD -- Volume status stable and managed per HD.  - previously had been unable to tolerate GDMT with low BP and ESRD.   2. CAD s/p CABG this admit (LIMA->LAD, SVG->OM, SVG->RCA on 03/15/19) - No s/s ischemia - Statin stopped due to rash.  - Continue 81 mg aspirin daily. No further hematuria.   3. Acute hypoxic respiratory failure with Pseudomonas PNA - s/p trach on 03/27/19 - completed meropenem for pseudomonas PNA - trach aspirate 3/5 w/ regrowth of Pseudomonas again.   - Completed antibiotic course.  - Trach decannulated 3/13.  - Stable on room air.    4. PAF - s/p MAZE and LAA occlusion - NSR on EKG 4/22 - Off amio due to rash (unclear which agent it was) - Can reconsider AC in near future as bladder issues stabilize.  - Can add apixaban 2.5 mg bid if recurrent AF.   5. AKI -> ESRD - due to shock/ATN.  - Tunneled cath placed 3/9. Clotted 4/8 s/p exchange by IR - He is tolerating iHD.  - Plan for CLIP THS RKC to start 06/16/19  - Vein mapping complete. Nephrology considering permanent access but no formal plans   6. Hematuria -Cystoscopy 3/15 with fulgeration - Back to OR 3/28 with fulgeration and formalin installation -No further bleeding.   7. Complete heart block with profound symptomatic bradycardia - likely vagal in nature - s/p emergent TVP 3/25 - resolved TVP out.   8. Debility, severe - improving w/ CIR - Continue to improve with rehab.   9. F/E/N - PO intake has improved, no longer requiring TFs     10. Left Pleural Effusion  - moderate to large. - s/p PleurX on 3/8.  - CXR on 4/22 with increase pleural effusion.   - TCTS managing. Draining M/Th  10. Rash - Initially w/ diffuse  rash on extremities and torso w/ appearance of drug induced rash. - Steroids started 3/21 w/ improvement/ resolution. Now off prednisone - redeveloped diffuse rash 4/6 after addition of Reglan and atorvastatin. Both agents discontinued.  - Rash resolved   11. Anemia: - hgb 8.1 on 4/24  - No obvious source of bleeding.   12. Dysphagia and BRBPR - ? Possible esophagitis +/- radiation proctitis - GI has seen.Treated with IV fluconazole.  - Resolved  13. Hyperkalemia - K 5.4 4/24 gettign HD today.   14. GU - Urine Culture - NGTD 04/13/19     Jesse Grinder, Jesse Murphy  12:37 PM  Patient seen and examined with the above-signed Advanced Practice Provider and/or Housestaff. I personally reviewed laboratory data, imaging studies and relevant notes. I independently examined the patient and formulated the important aspects of the plan. I have edited the note to reflect any of my changes or salient points. I have personally discussed the plan with the patient and/or family.  Much improved. Ambulating halls with walker. Remains HD dependent. Maintaining NSR. No CP.   For d/c later this week. Appreciate CIR care.   Jesse Bickers, MD  6:20 PM

## 2019-06-11 NOTE — Plan of Care (Signed)
  Problem: RH Problem Solving Goal: LTG Patient will demonstrate problem solving for (SLP) Description: LTG:  Patient will demonstrate problem solving for basic/complex daily situations with cues  (SLP) Flowsheets (Taken 06/11/2019 1239) LTG Patient will demonstrate problem solving for: (downgraded due to slower than anticipated progress) Minimal Assistance - Patient > 75% Note: Downgraded due to slower than anticipated progress

## 2019-06-11 NOTE — Progress Notes (Signed)
Admit: 05/22/2019 LOS: 63  63M ESRD after cardiogenic shock; CAD s/p CABG; in CIR.    Subjective:  Feels well.  Hasn't had HD today yet.  Spoke with son at bedside.   Review of systems:  Denies n/v Denies shortness of breath or chest pain Eating lunch   04/25 0701 - 04/26 0700 In: 218 [P.O.:218] Out: -   Filed Weights   06/09/19 1310 06/09/19 1647 06/11/19 0600  Weight: 82.1 kg 78.2 kg 80.3 kg    Scheduled Meds: . aspirin  81 mg Oral Daily  . Chlorhexidine Gluconate Cloth  6 each Topical Q0600  . [START ON 06/13/2019] darbepoetin (ARANESP) injection - DIALYSIS  150 mcg Intravenous Q Wed-HD  . famotidine  10 mg Oral Daily  . feeding supplement (NEPRO CARB STEADY)  237 mL Oral BID BM  . feeding supplement (PRO-STAT SUGAR FREE 64)  30 mL Oral BID  . Gerhardt's butt cream   Topical BID  . hydrocortisone   Rectal BID  . hydrocortisone  25 mg Rectal BID  . hydrocortisone cream   Topical BID  . insulin aspart  0-15 Units Subcutaneous TID WC  . metoCLOPramide  5 mg Oral TID AC & HS  . multivitamin  1 tablet Oral QHS  . nutrition supplement (JUVEN)  1 packet Oral BID BM  . ondansetron  4 mg Oral Once  . scopolamine  1 patch Transdermal Q72H  . simethicone  80 mg Oral TID  . sodium chloride flush  10-40 mL Intracatheter Q12H   Continuous Infusions: . anticoagulant sodium citrate    . ferric gluconate (FERRLECIT/NULECIT) IV     PRN Meds:.oxyCODONE **AND** acetaminophen, bisacodyl, camphor-menthol, diphenhydrAMINE, lidocaine, ondansetron, promethazine, sodium chloride, sodium chloride flush, sorbitol  Current Labs: reviewed  Results for Jesse Murphy, Jesse Murphy (MRN [id number]) as of 06/09/2019 11:01  Ref. Range 06/08/2019 16:22  Saturation Ratios Latest Ref Range: 17.9 - 39.5 % 21  Ferritin Latest Ref Range: 24 - 336 ng/mL 855 (H)    Physical Exam:  Blood pressure (!) 93/49, pulse 82, temperature 98.3 F (36.8 C), temperature source Oral, resp. rate 16, height 5\' 8"  (1.727 m), weight 80.3  kg, SpO2 97 %. NAD in chair RRR nl s1s2 CTAB L IJ TDC No rashes NCAT EOMI  A 1. ESRD, new during this admission; AKI from #2; 1. TDC dep't at this time 2. CLIP THS RKC start 5/1.   3. HD today and Wednesday as per previously charted plans then transition to TTS schedule after that for first treatment outpatient on 5/1 4. Renal panel in AM 2. S/p cardiogenic shock; CAD s/p CABG 3. Debility in CIR 4. Anemia, trending down,  1. Move ESA to qTues, inc dose 2. Add on Fe levels 5. CKD-BMD: Ca and P on low side, CTM; holding PhosLo   Recent Labs  Lab 06/07/19 2117 06/09/19 1336  NA 135 136  K 5.7* 5.4*  CL 98 99  CO2 27 26  GLUCOSE 108* 95  BUN 43* 36*  CREATININE 6.86* 6.24*  CALCIUM 7.9* 8.0*  PHOS 3.8 3.8   Recent Labs  Lab 06/07/19 2117 06/09/19 1335  WBC 7.8 6.0  HGB 7.9* 8.1*  HCT 26.8* 28.3*  MCV 96.1 98.6  PLT 122* 122*      Jesse Desanctis, MD  06/11/2019 12:57 PM

## 2019-06-11 NOTE — Progress Notes (Signed)
Speech Language Pathology Daily Session Note  Patient Details  Name: Jesse Murphy MRN: [id number] Date of Birth: [birth year]  Today's Date: 06/11/2019 SLP Individual Time: 3335-4562 SLP Individual Time Calculation (min): 29 min  Short Term Goals: Week 3: SLP Short Term Goal 1 (Week 3): STG=LTG due to remaining LOS  Skilled Therapeutic Interventions: Pt was seen for skilled ST targeting cognitive goals. SLP facilitated session with Mod A verbal and visual cues for semi-complex problem solving functional daily math situations, during which pt also required encouragement to participate. Only Supervision A verbal cues were required for verbal problem solving various safety related questions about home set up and how to prevent falls. Pt left sitting in wheelchair with seatbelt alarm in place and needs within reach. Continue per current plan of care.        Pain Pain Assessment Pain Scale: Faces Faces Pain Scale: No hurt   Therapy/Group: Individual Therapy  Arbutus Leas 06/11/2019, 7:07 AM

## 2019-06-12 ENCOUNTER — Ambulatory Visit (HOSPITAL_COMMUNITY): Payer: Medicare Other | Admitting: Physical Therapy

## 2019-06-12 ENCOUNTER — Encounter (HOSPITAL_COMMUNITY): Payer: Medicare Other | Admitting: Occupational Therapy

## 2019-06-12 ENCOUNTER — Encounter (HOSPITAL_COMMUNITY): Payer: Medicare Other | Admitting: Speech Pathology

## 2019-06-12 ENCOUNTER — Inpatient Hospital Stay (HOSPITAL_COMMUNITY): Payer: Medicare Other

## 2019-06-12 DIAGNOSIS — R627 Adult failure to thrive: Secondary | ICD-10-CM

## 2019-06-12 LAB — GLUCOSE, CAPILLARY
Glucose-Capillary: 91 mg/dL (ref 70–99)
Glucose-Capillary: 120 mg/dL — ABNORMAL HIGH (ref 70–99)
Glucose-Capillary: 117 mg/dL — ABNORMAL HIGH (ref 70–99)
Glucose-Capillary: 126 mg/dL — ABNORMAL HIGH (ref 70–99)

## 2019-06-12 MED ORDER — CHLORHEXIDINE GLUCONATE CLOTH 2 % EX PADS: 6.0000 | MEDICATED_PAD | Freq: Every day | CUTANEOUS | Status: DC

## 2019-06-12 MED ORDER — CHLORHEXIDINE GLUCONATE CLOTH 2 % EX PADS
6.0000 | MEDICATED_PAD | Freq: Two times a day (BID) | CUTANEOUS | Status: DC
  Administered 2019-06-13 – 2019-06-14 (×3): 6 via TOPICAL

## 2019-06-12 NOTE — Plan of Care (Signed)
  Problem: RH Stairs Goal: LTG Patient will ambulate up and down stairs w/assist (PT) Description: LTG: Patient will ambulate up and down # of stairs with assistance (PT) Outcome: Not Applicable Flowsheets (Taken 06/12/2019 1835) LTG: Pt will ambulate up/down stairs assist needed:: (discontinued as pt has ramped entrance) -- Note: discontinued as pt has ramped entrance   Problem: RH Balance Goal: LTG Patient will maintain dynamic standing balance (PT) Description: LTG:  Patient will maintain dynamic standing balance with assistance during mobility activities (PT) Flowsheets (Taken 06/12/2019 1835) LTG: Pt will maintain dynamic standing balance during mobility activities with:: (downgraded based on limited progress) Minimal Assistance - Patient > 75% Note: downgraded based on limited progress   Problem: Sit to Stand Goal: LTG:  Patient will perform sit to stand with assistance level (PT) Description: LTG:  Patient will perform sit to stand with assistance level (PT) Flowsheets (Taken 06/12/2019 1835) LTG: PT will perform sit to stand in preparation for functional mobility with assistance level: (downgraded based on limited progress) Minimal Assistance - Patient > 75% Note: downgraded based on limited progress   Problem: RH Bed Mobility Goal: LTG Patient will perform bed mobility with assist (PT) Description: LTG: Patient will perform bed mobility with assistance, with/without cues (PT). Flowsheets (Taken 06/12/2019 1835) LTG: Pt will perform bed mobility with assistance level of: (downgraded based on limited progress) Minimal Assistance - Patient > 75% Note: downgraded based on limited progress   Problem: RH Bed to Chair Transfers Goal: LTG Patient will perform bed/chair transfers w/assist (PT) Description: LTG: Patient will perform bed to chair transfers with assistance (PT). Flowsheets (Taken 06/12/2019 1835) LTG: Pt will perform Bed to Chair Transfers with assistance level: (downgraded  based on limited progress) Minimal Assistance - Patient > 75% Note: downgraded based on limited progress   Problem: RH Ambulation Goal: LTG Patient will ambulate in controlled environment (PT) Description: LTG: Patient will ambulate in a controlled environment, # of feet with assistance (PT). Flowsheets (Taken 06/12/2019 1835) LTG: Pt will ambulate in controlled environ  assist needed:: (downgraded based on limited progress) Minimal Assistance - Patient > 75% LTG: Ambulation distance in controlled environment: 74ft using LRAD Note: downgraded based on limited progress Goal: LTG Patient will ambulate in home environment (PT) Description: LTG: Patient will ambulate in home environment, # of feet with assistance (PT). Flowsheets (Taken 06/12/2019 1835) LTG: Pt will ambulate in home environ  assist needed:: (downgraded based on limited progress) Minimal Assistance - Patient > 75% LTG: Ambulation distance in home environment: 26ft using LRAD Note: downgraded based on limited progress

## 2019-06-12 NOTE — Progress Notes (Signed)
Physical Therapy Session Note  Patient Details  Name: Jesse Murphy MRN: [id number] Date of Birth: 07-31-1928  Today's Date: 06/12/2019 PT Individual Time: 4097-3532 PT Individual Time Calculation (min): 40 min   Short Term Goals: Week 3:  PT Short Term Goal 1 (Week 3): Pt will perform sit to stand with minA and LRAD PT Short Term Goal 2 (Week 3): Pt will perform bed ot chair transfer with minA and [name]. PT Short Term Goal 3 (Week 3): Pt will ambulate 62' with minA and LRAD.  Skilled Therapeutic Interventions/Progress Updates:     Pt sleeping in bed upon PT arrival. Agreeable to therapy and no report of pain. PT dons Ted Hose in supine. Left logrolling with supervision and verbal cues for sequencing. Pt uses bedrails. Pt requires minA to perform supine to sit with facilitation at trunk. Sit to stand from elevated EOB with minA, cues for increased anterior weight shift. Stand step transfer to [name] with CGA/minA.  Pt performs 2 bouts of ambulation. Initial bout pt performs without supplemental oxygen, requiring CGA/minA for safety with light manual facilitation of trunk extension and lateral weight shifting. x58'.  Pt's O2 sats following ambulation 85% with HR in high 90s. Extended seated rest break and PT add 2L supplemental oxygen. O2 sats quickly recover to mid 90s.  With 2nd bout, pt ambulates 87' with similar level of assistance. As pt is approaching WC pt flexes forward at knees and trunk. Pt stops initial LOB with minA from PT but then begins to flex farther forward, requiring maxA from PT at trunk to prevent fall. RN staff in hallway assist with bringing [place] behind pt to safely sit down. When asked what happened pt responds that "right knee gave out". Pt defers additional ambulation.  PT educates pt on importance and technique of using incentive spirometer and pt demos x10 reps.  Pt left seated in WC with alarm intact and all needs within reach. On 1L/min O2 with oxygen sats at  91%.     Therapy Documentation Precautions:  Precautions Precautions: Fall Precaution Booklet Issued: Yes (comment) Precaution Comments: monitor for orthostatic hypotension Restrictions Weight Bearing Restrictions: No RUE Weight Bearing: Weight bearing as tolerated RUE Partial Weight Bearing Percentage or Pounds: No restrictions Other Position/Activity Restrictions: no longer has sternal precautions or UE PWB precautions - confirmed with Dr. Dagoberto Ligas on 4/9    Therapy/Group: Individual Therapy  Breck Coons, PT, DPT 06/12/2019, 11:10 AM

## 2019-06-12 NOTE — Plan of Care (Signed)
  Problem: RH BOWEL ELIMINATION Goal: RH STG MANAGE BOWEL WITH ASSISTANCE Description: STG Manage Bowel with min Assistance. Outcome: Progressing Goal: RH STG MANAGE BOWEL W/MEDICATION W/ASSISTANCE Description: STG Manage Bowel with Medication with min Assistance. Outcome: Progressing   Problem: RH BLADDER ELIMINATION Goal: RH STG MANAGE BLADDER WITH ASSISTANCE Description: STG Manage Bladder With min/mod Assistance Outcome: Progressing   Problem: RH SKIN INTEGRITY Goal: RH STG SKIN FREE OF INFECTION/BREAKDOWN Description: Patients skin will remain free from FURTHER breakdown or infection with min/mod assist. Outcome: Progressing Goal: RH STG MAINTAIN SKIN INTEGRITY WITH ASSISTANCE Description: STG Maintain Skin Integrity With min/mod Assistance. Outcome: Progressing Goal: RH STG ABLE TO PERFORM INCISION/WOUND CARE W/ASSISTANCE Description: STG Able To Perform Incision/Wound Care With total assistance from caregiver. Outcome: Progressing   Problem: RH SAFETY Goal: RH STG ADHERE TO SAFETY PRECAUTIONS W/ASSISTANCE/DEVICE Description: STG Adhere to Safety Precautions With min Assistance/Device. Outcome: Progressing   Problem: RH PAIN MANAGEMENT Goal: RH STG PAIN MANAGED AT OR BELOW PT'S PAIN GOAL Description: Maintain pain control of 4 or less Outcome: Progressing

## 2019-06-12 NOTE — Progress Notes (Signed)
Nutrition Follow-up  DOCUMENTATION CODES:   Not applicable  INTERVENTION:   - Nepro Shake po BID, each supplement provides 425 kcal and 19 grams protein  -1 packet Juven BID, each packet provides 95 calories, 2.5 grams of protein, and 9.8 grams of carbohydrate; also contains L-arginine and L-glutamine, vitamin C, vitamin E, vitamin B-12, zinc, calcium, and calcium Beta-hydroxy-Beta-methylbutyrate to support wound healing  - Pro-stat 30 ml po BID, each supplement provides 100 kcal and 15 grams of protein  - Rena-vit daily  NUTRITION DIAGNOSIS:   Increased nutrient needs related to wound healing as evidenced by estimated needs.  Ongoing  GOAL:   Patient will meet greater than or equal to 90% of their needs  Progressing  MONITOR:   PO intake, Supplement acceptance, Skin, I & O's, Diet advancement, Labs, Weight trends  REASON FOR ASSESSMENT:   Consult Enteral/tube feeding initiation and management  ASSESSMENT:   Patient with PMH significant for CAD s/p PCI, prostate cancer, HTN, CHF, and DM. Presented this admission with chronic a.fib with RVR. Pt admitted to CIR 05/22/19.  1/28 - s/p L heart cath  1/30 - s/p CABG x 3, MAZE, extubated  2/02 - re-intubated  2/05 - extubated 2/08 - re-intubated  2/10 - s/p impella, trach, NGT placed, trickle feeding 2/11 - start CRRT 2/23 - removal of impella, [name] dislodged  2/24 - CRRT stopped 3/01 - failed iHD, back on CRRT 3/02 - s/p [name], nocturnal feedings 3/04 - diet advanced DYS2, nectar-thick  3/08 - s/p L PleurX catheter and L TDC 3/10 - tolerated iHD 3/13 - s/p decannulation  3/15 - s/p cystoscopy  3/17 - [name] clogged, s/p replacement 4/08 - [name] removed, NGT placed 4/12 - NGT removed, TF discontinued  4/16 - diet upgraded to regular with thin liquids  Noted target d/c date of 4/29.  Last HD was yesterday with 1750 ml net UF. Post-HD weight was 77.7 kg.  Current weight is 5 lbs below admission  weight.  Spoke with pt at bedside who reports that he is eating well despite having a decreased appetite. Pt drinking a Nepro shake at time of RD visit and reports he plans on finishing it. Pt states that he is drinking the Nepro shakes.  Meal Completion: 0-75% x last 8 recorded meals (several meals missing)  Medications reviewed and include: pepcid, Nepro BID, Pro-stat BID, SSI, Reglan 5 mg QID, rena-vit, Juven BID, simethicone  Labs reviewed: phosphorus 2.4, hemoglobin 7.8 CBG's: 91-143 x 24 hours  Diet Order:   Diet Order            Diet renal with fluid restriction Fluid restriction: 1800 mL Fluid; Room service appropriate? Yes; Fluid consistency: Thin  Diet effective now              EDUCATION NEEDS:   Not appropriate for education at this time  Skin:  Skin Assessment: Skin Integrity Issues: DTI: R heel Stage II: R ear, R buttocks  Last BM:  06/12/19 type 1  Height:   Ht Readings from Last 1 Encounters:  05/22/19 5\' 8"  (1.727 m)    Weight:   Wt Readings from Last 1 Encounters:  06/12/19 80.6 kg    BMI:  Body mass index is 27 kg/m.  Estimated Nutritional Needs:   Kcal:  2200-2400  Protein:  135-155 grams  Fluid:  >/= 2.2 L/day    Gaynell Face, MS, RD, LDN Inpatient Clinical Dietitian Pager: [phone] Weekend/After Hours: [phone]

## 2019-06-12 NOTE — Progress Notes (Signed)
Family education done with pt's son, Letitia Libra on pleurx  drain. Son demonstrated proper sterile techniques of using the drainage system, adjusting the drain speed to the pt's comfort level using the clamp, and putting a clean dressing to the site after completion. Removed 312mL of amber colored, mostly clear drainage with few small clumps floating. Pt tolerated well. Son did well and states he would benefit from further education before pt's discharges on Thursday. Continue plan of care.   Gerald Stabs, RN

## 2019-06-12 NOTE — Progress Notes (Addendum)
PHYSICAL MEDICINE & REHABILITATION PROGRESS NOTE  Subjective/Complaints: Patient seen laying in bed this AM.  He states he slept well overnight,  He notes he is eating almost everything, but may not be reflected in documentation.  He states he does not feel SOB, but desatting per therapies. He was seen by HF yeserday, notes reviewed - no changed. He was seen by Nephro, notes reviewed - plan to transition to TTS.  ROS: Denies CP, SOB, N/V/D  Objective:   No results found. Recent Labs    06/09/19 1335 06/11/19 1350  WBC 6.0 6.3  HGB 8.1* 7.8*  HCT 28.3* 26.3*  PLT 122* 152   Recent Labs    06/09/19 1336 06/11/19 1350  NA 136 136  K 5.4* 4.6  CL 99 99  CO2 26 26  GLUCOSE 95 143*  BUN 36* 18  CREATININE 6.24* 4.41*  CALCIUM 8.0* 7.9*    Intake/Output Summary (Last 24 hours) at 06/12/2019 1042 Last data filed at 06/12/2019 0900 Gross per 24 hour  Intake 358 ml  Output 1750 ml  Net -1392 ml     Physical Exam: Vital Signs Blood pressure (!) 114/51, pulse 80, temperature 98 F (36.7 C), temperature source Oral, resp. rate 19, height 5\' 8"  (1.727 m), weight 80.6 kg, SpO2 99 %.  Constitutional: No distress . Vital signs reviewed. HENT: Normocephalic.  Atraumatic. Eyes: EOMI. No discharge. Cardiovascular: No JVD. Respiratory: Normal effort.  No stridor. +[place]. GI: Non-distended. Skin: Warm and dry.  Intact. Psych: Normal mood.  Normal behavior. Musc: Generalized edema improving. Neurologic: Alert Motor: 4/5 throughout, unchanged  Assessment/Plan: 1. Functional deficits secondary to debility which require 3+ hours per day of interdisciplinary therapy in a comprehensive inpatient rehab setting.  Physiatrist is providing close team supervision and 24 hour management of active medical problems listed below.  Physiatrist and rehab team continue to assess barriers to discharge/monitor patient progress toward functional and medical goals  Care Tool:  Bathing     Body parts bathed by patient: Right arm, Left arm, Chest, Abdomen, Face, Front perineal area, Right upper leg, Left upper leg   Body parts bathed by helper: Right lower leg, Left lower leg, Buttocks     Bathing assist Assist Level: Moderate Assistance - Patient 50 - 74%     Upper Body Dressing/Undressing Upper body dressing   What is the patient wearing?: Pull over shirt    Upper body assist Assist Level: Minimal Assistance - Patient > 75%    Lower Body Dressing/Undressing Lower body dressing      What is the patient wearing?: Incontinence brief, Pants     Lower body assist Assist for lower body dressing: Moderate Assistance - Patient 50 - 74%     Toileting Toileting    Toileting assist Assist for toileting: Total Assistance - Patient < 25%     Transfers Chair/bed transfer  Transfers assist  Chair/bed transfer activity did not occur: Safety/medical concerns(nausea/dizziness/fatigue)  Chair/bed transfer assist level: Minimal Assistance - Patient > 75% Chair/bed transfer assistive device: Programmer, multimedia   Ambulation assist   Ambulation activity did not occur: Safety/medical concerns(nausea/dizziness/fatigue)  Assist level: Minimal Assistance - Patient > 75% Assistive device: Walker-rolling Max distance: 40 ft   Walk 10 feet activity   Assist  Walk 10 feet activity did not occur: Safety/medical concerns  Assist level: Minimal Assistance - Patient > 75% Assistive device: Walker-rolling   Walk 50 feet activity   Assist Walk 50 feet with 2  turns activity did not occur: Safety/medical concerns         Walk 150 feet activity   Assist Walk 150 feet activity did not occur: Safety/medical concerns         Walk 10 feet on uneven surface  activity   Assist Walk 10 feet on uneven surfaces activity did not occur: Safety/medical concerns         Wheelchair     Assist Will patient use wheelchair at discharge?: No(sternal  precautions) Type of Wheelchair: Manual    Wheelchair assist level: Supervision/Verbal cueing Max wheelchair distance: 50 ft    Wheelchair 50 feet with 2 turns activity    Assist        Assist Level: Supervision/Verbal cueing   Wheelchair 150 feet activity     Assist          Blood pressure (!) 114/51, pulse 80, temperature 98 F (36.7 C), temperature source Oral, resp. rate 19, height 5\' 8"  (1.727 m), weight 80.6 kg, SpO2 99 %.  Medical Problem List and Plan: 1.Debilitysecondary to CAD status post CABG 03/15/2019. Impella device removed 04/10/2019. Sternal precautions  Continue CIR  Chest x-ray  showing increase effusion-maintain Pleurx as outpatient per CTS. 2. Antithrombotics: -DVT/anticoagulation:SCDs- due to hematuria -antiplatelet therapy: Aspirin 81 mg daily 3. Pain Management:Oxycodone as needed  Controlled on 4/16 4. Mood:Provide emotional support   Remeron started on 4/14 with improvement -antipsychotic agents: N/A 5. Neuropsych: This patientiscapable of making decisions on hisown behalf. 6. Skin/Wound Care:Routine skin checks  Continue PRAFO's for bilateral lower extremities 7. Fluids/Electrolytes/Nutrition:Monitor I/Os 8. Acute systolic congestive heart failure/cardiogenic shock. Follow-up cardiology services Filed Weights   06/11/19 1334 06/11/19 1814 06/12/19 0644  Weight: 78.9 kg 77.7 kg 80.6 kg   ?Reliability on 4/27 9. Acute hypoxic respiratory failure with Pseudomonas pneumonia. Status post tracheostomy 03/27/2019. Decannulated 04/28/2019.  Requiring supplemental oxygen again, continues to require on 4/27 10.Acute on chronic anemia/thrombocytopenia. No more heparin for HD for now  Hemoglobin 7.8 on 4/26, labs with HD  Platelets 152 on 4/26, labs with HD  HIT negative 11. Dysphagia/nausea.   Advance to regular thins.  Follow-up speech therapy.Nasogastric tube/cortrakfor nutritional support.    Dietary follow-up- see dietary note dated 4/6 for additional information.   -PEG not safe option as per cardiothoracic surgery team, will consider follow-up with CTS if necessary.   4/9- Added Phenergan 12.5 mg Q6 hours prn for nausea since zofran doesn't workAdded scopolamine patch for nausea and dizziness.   KUB personally reviewed, bowel gas pattern.    Encourage patient to have son bring in food that patient enjoys and have it modified, reminded again  ECG reviewed, borderline QTC-trial low-dose Reglan with meals on 4/14, with improvement, DC'd on 4/19  Remeron started on 4/14  Limited/no PO intake per documentation, however, good intake per patient 12. 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 Patient has been transitioned to dialysis and being sent to OP HD after CIR, so is chronicand followed by renal services. Tunneled L IJcatheter placed 04/24/2019, exchanged 13. Hematuria. Cystoscopy completed with evacuation of clot. No further bleeding episodes per notes, intermittent bleeding.   Continue to monitor 14. Left pleural effusion. Status post Pleurx catheter 04/23/2019.PATIENT WILL KEEP PLEURX CATHETER FOR NOW PER CVTS.Currently being drained Mondaysand Thursdays 15. Diffuse drug rashversus contact dermatitis: Resolved  Steroid taper completed   Benadryl prn, Sarna lotion, hypoallergenic sheets.  16.Rectal bleeding. 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 Follow-up GI with conservative care  no further bleeding reported.   See #10 17. PAF. Currently off amiodarone. No plan to resume any anticoagulation at this time until hematuria fully resolved.   Vitals:   06/11/19 2009 06/12/19 0644  BP: (!) 104/46 (!) 114/51  Pulse: 85 80  Resp: 18 19  Temp: 98.2 F (36.8 C) 98 F (36.7 C)  SpO2: 100% 99%    Rate controlled on 4/27 18.  Prediabetes  Monitor with increased mobility   Relatively controlled on 4/27 19.  Leukocytosis:  Resolved  Afebrile 20.  Severe hypoalbuminemia  Supplement initiated, stressed importance again  LOS: 21 days A FACE TO FACE EVALUATION WAS PERFORMED  Vilma Will Lorie Phenix 06/12/2019, 10:42 AM

## 2019-06-12 NOTE — Progress Notes (Signed)
Admit: 05/22/2019 LOS: 21  55M ESRD after cardiogenic shock; CAD s/p CABG; in CIR.    Subjective:  Feels well.  States plan for discharge on Thursday.  Had HD yesterday and received tpa.  Got 1.8 liters UF on 4/26.  Banana on tray. Family at bedside - updated.  Discussed risks, benefits, and indications for blood transfusion and he consents to blood and states has received before.   Review of systems:  Denies n/v Denies shortness of breath or chest pain Eating lunch   04/26 0701 - 04/27 0700 In: 240 [P.O.:240] Out: 1750   Filed Weights   06/11/19 1334 06/11/19 1814 06/12/19 0644  Weight: 78.9 kg 77.7 kg 80.6 kg    Scheduled Meds: . aspirin  81 mg Oral Daily  . Chlorhexidine Gluconate Cloth  6 each Topical Q0600  . [START ON 06/13/2019] darbepoetin (ARANESP) injection - DIALYSIS  150 mcg Intravenous Q Wed-HD  . famotidine  10 mg Oral Daily  . feeding supplement (NEPRO CARB STEADY)  237 mL Oral BID BM  . feeding supplement (PRO-STAT SUGAR FREE 64)  30 mL Oral BID  . Gerhardt's butt cream   Topical BID  . hydrocortisone   Rectal BID  . hydrocortisone  25 mg Rectal BID  . hydrocortisone cream   Topical BID  . insulin aspart  0-15 Units Subcutaneous TID WC  . metoCLOPramide  5 mg Oral TID AC & HS  . multivitamin  1 tablet Oral QHS  . nutrition supplement (JUVEN)  1 packet Oral BID BM  . ondansetron  4 mg Oral Once  . scopolamine  1 patch Transdermal Q72H  . simethicone  80 mg Oral TID  . sodium chloride flush  10-40 mL Intracatheter Q12H   Continuous Infusions: . anticoagulant sodium citrate    . ferric gluconate (FERRLECIT/NULECIT) IV 125 mg (06/11/19 1602)   PRN Meds:.oxyCODONE **AND** acetaminophen, bisacodyl, camphor-menthol, diphenhydrAMINE, lidocaine, ondansetron, promethazine, sodium chloride, sodium chloride flush, sorbitol  Current Labs: reviewed  Results for Jesse Murphy, Jesse Murphy (MRN [id number]) as of 06/09/2019 11:01  Ref. Range 06/08/2019 16:22  Saturation Ratios Latest  Ref Range: 17.9 - 39.5 % 21  Ferritin Latest Ref Range: 24 - 336 ng/mL 855 (H)    Physical Exam:  Blood pressure (!) 114/51, pulse 80, temperature 98 F (36.7 C), temperature source Oral, resp. rate 19, height 5\' 8"  (1.727 m), weight 80.6 kg, SpO2 91 %. NAD in chair RRR nl s1s2 CTAB L IJ TDC No rashes NCAT EOMI abd soft nt nd Extremities trace edema  A 1. ESRD, new during this admission; AKI from #2; 1. TDC dep't at this time 2. CLIP THS RKC start 5/1.   3. Next HD on Wednesday, 4/28 as per previously charted plans then transition to TTS schedule after that for first treatment outpatient on 5/1.  Will run first shift on 4/28 to ensure catheter working well  4. Change to renal diet.   5. Note sign above pt's bed to lock with citrate rather than heparin - will order 2. S/p cardiogenic shock; CAD s/p CABG 3. Debility in CIR 4. Anemia, trending down,  1. aranesp at 150 mcg for next dose on 4/28 2. On IV iron with HD 3. PRBC's on HD on 4/28.  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

## 2019-06-12 NOTE — Discharge Summary (Addendum)
Physician Discharge Summary  Patient ID: Jesse Murphy MRN: [id number] DOB/AGE: Nov 12, 1928 86 y.o.  Admit date: 05/22/2019 Discharge date: 06/14/2019  Discharge Diagnoses:  Principal Problem:   Debility Active Problems:   Pressure ulcer   Failure to thrive in adult   Allergic contact dermatitis   ESRD on dialysis (HCC)   Dysphagia   Thrombocytopenia (HCC)   Acute on chronic anemia   Supplemental oxygen dependent   Acute systolic congestive heart failure (HCC)   Hypoalbuminemia due to protein-calorie malnutrition (HCC)   Prediabetes   Chest tube in place   Failure to thrive (child)   Adult failure to thrive Left pleural effusion Rectal bleeding Non-Hodgkin's lymphoma CAD with CABG   Discharged Condition: Stable  Significant Diagnostic Studies: DG Chest 2 View  Result Date: 06/07/2019 CLINICAL DATA:  Shortness of breath.  Chest pain. EXAM: CHEST - 2 VIEW COMPARISON:  05/22/2019. FINDINGS: Left central line and left chest tube in stable position. Prior CABG. Left atrial appendage clip in stable position. Cardiomegaly. Diffuse bilateral pulmonary interstitial prominence and bilateral pleural effusions again noted suggesting CHF. Left pleural effusion appears to have increased on today's exam. No pneumothorax. IMPRESSION: 1.  Left central line and left chest tube in stable position. 2. Prior CABG. Cardiomegaly with bilateral interstitial prominence and bilateral pleural effusions again noted suggesting CHF. Left pleural effusion appears to have increased on today's exam. Electronically Signed   By: Marcello Moores  Register   On: 06/07/2019 08:00   DG Abd 1 View  Result Date: 05/28/2019 CLINICAL DATA:  Failure to thrive EXAM: ABDOMEN - 1 VIEW COMPARISON:  05/24/2019, 03/30/2019 FINDINGS: Lung bases are non included. No feeding tube visible on the current image. Nonobstructed bowel-gas pattern. Probable vascular calcifications in the left upper quadrant. IMPRESSION: Nonobstructed bowel-gas  pattern. No feeding tube is visible on the current image. Electronically Signed   By: Donavan Foil M.D.   On: 05/28/2019 19:50   DG Abd 1 View  Result Date: 05/24/2019 CLINICAL DATA:  Feeding tube replacement. EXAM: ABDOMEN - 1 VIEW COMPARISON:  None. FINDINGS: Feeding tube tip in the junction of the first and second portions of the duodenum. IMPRESSION: Feeding tube well positioned. Electronically Signed   By: Nelson Chimes M.D.   On: 05/24/2019 14:39   IR Fluoro Guide CV Line Right  Result Date: 05/24/2019 INDICATION: 86 year old male with renal failure referred for replacement of a nonfunctional hemodialysis catheter placed 04/23/2019 EXAM: TUNNELED CENTRAL VENOUS HEMODIALYSIS CATHETER PLACEMENT WITH ULTRASOUND AND FLUOROSCOPIC GUIDANCE MEDICATIONS: None ANESTHESIA/SEDATION: None FLUOROSCOPY TIME:  Fluoroscopy Time: 0 minutes 6 seconds (1 mGy). COMPLICATIONS: None PROCEDURE: Informed written consent was obtained from the patient after a discussion of the risks, benefits, and alternatives to treatment. Questions regarding the procedure were encouraged and answered. The left neck and chest including indwelling catheter were prepped with chlorhexidine in a sterile fashion, and a sterile drape was applied covering the operative field. Maximum barrier sterile technique with sterile gowns and gloves were used for the procedure. A timeout was performed prior to the initiation of the procedure. The dwell on both hub was successfully aspirated, with return of venous blood. Initial image demonstrates that the previous indwelling catheter has been withdrawn slightly from the original position. 1% lidocaine was used for local anesthesia. A stiff Glidewire was advanced to the level of the IVC and catheter was bluntly dissected from the tissue tract. Modified Seldinger technique was then used to exchange the hemodialysis catheter on the Glidewire. 23 cm tip to cuff  catheter was selected. Final catheter positioning was  confirmed and documented with a spot radiographic image. The catheter aspirates and flushes normally. The catheter was flushed with appropriate volume heparin dwells. The catheter exit site was secured with a 0-Prolene retention suture. Dressings were applied. The patient tolerated the procedure well without immediate post procedural complication. IMPRESSION: Status post exchange of left IJ hemodialysis catheter with placement of a new 23 cm palindrome catheter. Signed, Dulcy Fanny. Dellia Nims, RPVI [facility] Radiology Electronically Signed   By: Corrie Mckusick D.O.   On: 05/24/2019 16:31   DG Chest Port 1 View  Result Date: 05/22/2019 CLINICAL DATA:  Status post coronary bypass grafting EXAM: PORTABLE CHEST 1 VIEW COMPARISON:  05/19/2019 FINDINGS: Postsurgical changes are again seen. Feeding catheter is again identified and stable as is a left jugular dialysis catheter. Lungs are well aerated bilaterally. Minimal vascular congestion is noted without interstitial edema. Small posteriorly layering left-sided pleural effusion is seen. Left PleurX tube is again noted and stable. IMPRESSION: Mild vascular congestion without interstitial edema. Small left pleural effusion posteriorly. Tubes and lines as described. Electronically Signed   By: Inez Catalina M.D.   On: 05/22/2019 08:43   DG Chest Port 1 View  Result Date: 05/19/2019 CLINICAL DATA:  Atrial fibrillation with rapid ventricular response. History of CABG. EXAM: PORTABLE CHEST 1 VIEW COMPARISON:  Chest x-rays dated 05/14/2019 and 05/13/2019. FINDINGS: Support apparatus appears stable in position. Heart size and mediastinal contours are stable. Probable mild atelectasis and/or small pleural effusion at the LEFT lung base, but improved compared to the previous study. No pneumothorax is seen. IMPRESSION: 1. Improved aeration at both lung bases. Persistent mild atelectasis and/or small pleural effusion at the LEFT  lung base, but improved. 2. Support apparatus appears stable in position. Electronically Signed   By: Franki Cabot M.D.   On: 05/19/2019 07:57   DG Swallowing Func-Speech Pathology  Result Date: 05/15/2019 Objective Swallowing Evaluation: Type of Study: MBS-Modified Barium Swallow Study  Patient Details Name: KEVIN SPACE MRN: [id number] Date of Birth: [birth year] Today's Date: 05/15/2019 Time: SLP Start Time (ACUTE ONLY): 1245 -SLP Stop Time (ACUTE ONLY): 1300 SLP Time Calculation (min) (ACUTE ONLY): 15 min Past Medical History: Past Medical History: Diagnosis Date  Atrial fibrillation ([place])   CAD (coronary artery disease)   a. s/p PCI in 1992 b. Coronary CT in 01/2018 showing extensive coronary calcification; FFR indeterminate --> medical management pursued at that time as patient not interested in repeat cath  Cancer [facility])   prostate  Dyspnea   Hypertension  Past Surgical History: Past Surgical History: Procedure Laterality Date  CHEST TUBE INSERTION Left 04/23/2019  Procedure: INSERTION PLEURAL DRAINAGE CATHETER TO DRAIN LEFT PLEURAL EFFUSION;  Surgeon: Ivin Poot, MD;  Location: [place];  Service: Thoracic;  Laterality: Left;  CLIPPING OF ATRIAL APPENDAGE N/A 03/16/2019  Procedure: CLIPPING OF LEFT  ATRIAL APPENDAGE - USING ATRICLIP SIZE 40;  Surgeon: Ivin Poot, MD;  Location: [place];  Service: Open Heart Surgery;  Laterality: N/A;  COLONOSCOPY N/A 08/25/2017  Procedure: COLONOSCOPY;  Surgeon: Rogene Houston, MD;  [facility];  Service: Endoscopy;  Laterality: N/A;  CORONARY ARTERY BYPASS GRAFT N/A 03/16/2019  Procedure: CORONARY ARTERY BYPASS GRAFTING (CABG), ON PUMP, TIMES THREE, USING LEFT INTERNAL MAMMARY ARTERY, RIGHT GREATER SAPHENOUS VEIN HARVESTED ENDOSCOPICALLY;  Surgeon: Ivin Poot, MD;  Location: [place];  Service: Open Heart Surgery;  Laterality: N/A;  swan only  CYSTOSCOPY  WITH FULGERATION N/A 04/30/2019  Procedure: CYSTOSCOPY WITH FULGERATION;  Surgeon: Irine Seal, MD;   Location: [place];  Service: Urology;  Laterality: N/A;  HERNIA REPAIR    IR FLUORO GUIDE CV LINE LEFT  04/23/2019  IR US GUIDE VASC ACCESS LEFT  04/23/2019  MAZE N/A 03/16/2019  Procedure: MAZE;  Surgeon: Ivin Poot, MD;  Location: [place];  Service: Open Heart Surgery;  Laterality: N/A;  PLACEMENT OF IMPELLA LEFT VENTRICULAR ASSIST DEVICE Right 03/27/2019  Procedure: Placement Of Impella Left Ventricular Assist Device using ABIOMED Impella 5.5 with SmartAssist Device.;  Surgeon: Wonda Olds, MD;  [facility];  Service: Thoracic;  Laterality: Right;  PROSTATE SURGERY    REMOVAL OF IMPELLA LEFT VENTRICULAR ASSIST DEVICE N/A 04/10/2019  Procedure: REMOVAL OF IMPELLA LEFT VENTRICULAR ASSIST DEVICE WITH INSERTION OF RIGHT FEMORAL ARTERIAL LINE;  Surgeon: Ivin Poot, MD;  Location: [place];  Service: Open Heart Surgery;  Laterality: N/A;  RIGHT/LEFT HEART CATH AND CORONARY ANGIOGRAPHY N/A 03/15/2019  Procedure: RIGHT/LEFT HEART CATH AND CORONARY ANGIOGRAPHY;  Surgeon: Burnell Blanks, MD;  Location: [place] CV [facility];  Service: Cardiovascular;  Laterality: N/A;  TEE WITHOUT CARDIOVERSION N/A 03/16/2019  Procedure: TRANSESOPHAGEAL ECHOCARDIOGRAM (TEE);  Surgeon: Prescott Gum, Collier Salina, MD;  Location: [place];  Service: Open Heart Surgery;  Laterality: N/A;  TRACHEOSTOMY TUBE PLACEMENT N/A 03/27/2019  Procedure: TRACHEOSTOMY placed using Shiley 8DCT Cuffed.;  Surgeon: Wonda Olds, MD;  Location[facility];  Service: Thoracic;  Laterality: N/A;  TRANSURETHRAL RESECTION OF BLADDER NECK N/A 05/13/2019  Procedure: CYSTOSCOPY CLOT EVACUATION FULGRATION CYSTOGRAM AND INSTILLATION OF FORMALIN;  Surgeon: Lucas Mallow, MD;  Location: [place];  Service: Urology;  Laterality: N/A; HPI: 86 y.o. male with PMH: h/o CAD s/p CABG and clipping of left atrial appendage, who presented with SOB on 2/1. He became hypoxic on 2/2 with pulmonary edema and needed to be intubated on 2/2 and extubated 2/5. Reintubated 2/8.  Pt underwent  impella placement and tracheostomy on 2/9. Pt started on CRRT 2/11. Impella removal 2/23. Pt decannulated 3/13.  MBS 3/16 with penetration of nectar to the vocal folds without sensation.   Subjective: pleasant, a bit sleepy Assessment / Plan / Recommendation CHL IP CLINICAL IMPRESSIONS 05/15/2019 Clinical Impression Pt presents with improved oropharyngeal swallow function marked by functional oral control and better base-of-tongue propulsion, much less residue in pharynx post-swallow with better sensation of its presence. Pt continues to have delays in onset of laryngeal vestibule closure - this led, on one occasion, to moderate aspiration of thin liquids during the swallow.  When pt tucked his chin, duration of laryngeal vestibule closure was longer, allowing thin liquids to pass through pharynx and into UES without penetration nor aspiration.  This posture was repeated multiple times to ensure success.  Recommend advancing diet to dysphagia 2, thin liquids - pt may use straw to allow him to tuck chin in advance of sipping drinks.  RN present for study; results/recs reviewed.  SLP Visit Diagnosis Dysphagia, oropharyngeal phase (R13.12) Attention and concentration deficit following -- Frontal lobe and executive function deficit following -- Impact on safety and function Mild aspiration risk   CHL IP TREATMENT RECOMMENDATION 05/15/2019 Treatment Recommendations Therapy as outlined in treatment plan below   Prognosis 05/15/2019 Prognosis for Safe Diet Advancement Good Barriers to Reach Goals -- Barriers/Prognosis Comment -- CHL IP DIET RECOMMENDATION 05/15/2019 SLP Diet Recommendations Dysphagia 2 (Fine chop) solids;Thin liquid Liquid Administration via Straw;Cup Medication Administration Whole meds with puree  Compensations Chin tuck;Slow rate Postural Changes --   CHL IP OTHER RECOMMENDATIONS 05/15/2019 Recommended Consults -- Oral Care Recommendations Oral care BID Other Recommendations --   CHL IP FOLLOW UP  RECOMMENDATIONS 05/15/2019 Follow up Recommendations Skilled Nursing facility   [facility] IP FREQUENCY AND DURATION 05/15/2019 Speech Therapy Frequency (ACUTE ONLY) min 3x week Treatment Duration 2 weeks      CHL IP ORAL PHASE 05/15/2019 Oral Phase WFL Oral - Pudding Teaspoon -- Oral - Pudding Cup -- Oral - Honey Teaspoon -- Oral - Honey Cup -- Oral - Nectar Teaspoon -- Oral - Nectar Cup -- Oral - Nectar Straw -- Oral - Thin Teaspoon -- Oral - Thin Cup -- Oral - Thin Straw -- Oral - Puree -- Oral - Mech Soft -- Oral - Regular -- Oral - Multi-Consistency -- Oral - Pill -- Oral Phase - Comment --  CHL IP PHARYNGEAL PHASE 05/15/2019 Pharyngeal Phase Impaired Pharyngeal- Pudding Teaspoon -- Pharyngeal -- Pharyngeal- Pudding Cup -- Pharyngeal -- Pharyngeal- Honey Teaspoon -- Pharyngeal -- Pharyngeal- Honey Cup NT Pharyngeal -- Pharyngeal- Nectar Teaspoon -- Pharyngeal -- Pharyngeal- Nectar Cup NT Pharyngeal -- Pharyngeal- Nectar Straw NT Pharyngeal -- Pharyngeal- Thin Teaspoon -- Pharyngeal -- Pharyngeal- Thin Cup NT Pharyngeal -- Pharyngeal- Thin Straw Delayed swallow initiation-pyriform sinuses;Delayed swallow initiation-vallecula;Penetration/Aspiration during swallow;Moderate aspiration Pharyngeal Material enters airway, passes BELOW cords and not ejected out despite cough attempt by patient Pharyngeal- Puree Delayed swallow initiation-vallecula;Pharyngeal residue - valleculae Pharyngeal Material does not enter airway Pharyngeal- Mechanical Soft -- Pharyngeal -- Pharyngeal- Regular -- Pharyngeal -- Pharyngeal- Multi-consistency -- Pharyngeal -- Pharyngeal- Pill -- Pharyngeal -- Pharyngeal Comment --  CHL IP CERVICAL ESOPHAGEAL PHASE 05/01/2019 Cervical Esophageal Phase WFL Pudding Teaspoon -- Pudding Cup -- Honey Teaspoon -- Honey Cup -- Nectar Teaspoon -- Nectar Cup -- Nectar Straw -- Thin Teaspoon -- Thin Cup -- Thin Straw -- Puree -- Mechanical Soft -- Regular -- Multi-consistency -- Pill -- Cervical Esophageal Comment --  Assunta Curtis 05/15/2019, 2:48 PM               Labs:  Basic Metabolic Panel: Recent Labs  Lab 06/07/19 2117 06/09/19 1336 06/11/19 1350 06/13/19 0823  NA 135 136 136 135  K 5.7* 5.4* 4.6 4.9  CL 98 99 99 99  CO2 27 26 26 27   GLUCOSE 108* 95 143* 95  BUN 43* 36* 18 29*  CREATININE 6.86* 6.24* 4.41* 5.65*  CALCIUM 7.9* 8.0* 7.9* 8.0*  PHOS 3.8 3.8 2.4* 2.7    CBC: Recent Labs  Lab 06/09/19 1335 06/11/19 1350 06/13/19 0823  WBC 6.0 6.3 5.2  HGB 8.1* 7.8* 7.5*  HCT 28.3* 26.3* 25.2*  MCV 98.6 98.5 99.6  PLT 122* 152 147*    CBG: Recent Labs  Lab 06/12/19 2106 06/13/19 0613 06/13/19 1224 06/13/19 1656 06/13/19 2117  GLUCAP 126* 94 79 83 104*   Family history.  Mother with CAD Father with CAD Sister with CAD.  Denies any diabetes mellitus colon cancer esophageal cancer or rectal cancer  Brief HPI:   MCDANIEL OHMS is a 86 y.o. right-handed male with history of CAD, chronic atrial fibrillation on Xarelto as well as non-Hodgkin's lymphoma, chronic anemia, hypertension, diastolic congestive heart failure, prostate cancer.  Lives with spouse independent prior to admission.  Wife uses a cane.  Local children check on him as needed.  Presented to [place] 03/13/2019 with tachycardia as well as vague associated chest discomfort and palpitations.  Initial heart rate 200 bpm was  given intravenous Cardizem.  Initial labs hemoglobin 11.3 platelets 187,000 sodium 138 potassium 3.9 creatinine 0.96 initial troponin 30 2 repeat values 440-9 26 and 585.  Chest x-ray showed no evidence of consolidation or effusion.  EKG showed atrial fibrillation with RVR heart rate 145 no left bundle branch block.  DCCV was attempted in the ED but remained in atrial fibrillation.  Echocardiogram ejection fraction 35% compared to previous echoes 55 to 60% November 2019.  He was transferred to [facility] for further evaluation.  Cardiology service follow-up patient with cardiac  catheterization completed with LM/LAD and RCA disease.  Patient underwent CABG x3 left atrial Maze procedure 03/17/2019.  Impella removed 04/10/2019 patient did require long-term intubation ultimately due to acute hypoxia respiratory failure tracheostomy completed 03/27/2019 decannulated 04/28/2019.  Patient did have left pleural effusion necessitating need for Pleurx catheter 04/23/2019.  He developed a rash felt to be possibly drug-induced responded well to prednisone taper.  Nephrology consulted 03/29/2019 for AKI with creatinine 4.43 from baseline 1.7.  Patient ultimately required hemodialysis currently remains on schedule Monday Wednesday Friday.  Gastroenterology services consulted 05/07/2019 for rectal bleeding felt bleeding episode likely due to radiation proctitis continue to monitor.  Palliative care consulted to establish goals of care.  Urology services 04/04/2019 for urinary retention as well as hemorrhagic cystitis underwent cystogram 05/13/2019 a small to moderate amount of clot was evacuated.  Ongoing bouts of anemia transfused as needed latest hemoglobin 11 platelets 99,000.  Patient was maintained on a dysphagia #2 thin liquid diet as well as nasogastric tube feeds for nutritional support.  Therapy evaluations completed and patient was admitted for a comprehensive rehab program   Hospital Course: ABRAHM MANCIA was admitted to rehab 05/22/2019 for inpatient therapies to consist of PT, ST and OT at least three hours five days a week. Past admission physiatrist, therapy team and rehab RN have worked together to provide customized collaborative inpatient rehab.  Pertaining to patient's debility multimedical CAD with CABG 03/15/2019 Impella device removed 04/10/2019.  Sternal precautions patient would follow-up with cardiothoracic surgery.  Patient with pleural effusion maintain Pleurx catheter as outpatient again follow-up per cardiothoracic surgery latest chest x-ray stable.  Patient remained on low-dose aspirin  therapy after bypass surgery.  He exhibited no other signs of fluid overload close follow-up per cardiology services.  Hospital course complicated by acute hypoxic respiratory failure tracheostomy 03/27/2019 decannulated 04/28/2019 close monitoring of oxygen saturations.  Acute on chronic anemia hemoglobin 8.1 HIT negative.  His diet is since been advanced to regular nasogastric tube removed.  PEG tube was considered not felt to be a safe option per cardiothoracic surgery team at this time.  AKI with CKD new end-stage renal disease hemodialysis ongoing as per renal services with tunneled catheter placed 04/24/2019.   Blood pressures were monitored on TID basis and soft and controlled    He/ has made gains during rehab stay and is attending therapies  He/ will continue to receive follow up therapies   after discharge  Rehab course: During patient's stay in rehab weekly team conferences were held to monitor patient's progress, set goals and discuss barriers to discharge. At admission, patient required minimal assist supine to sit moderate assist sit to supine max assist sit to stand moderate assist ambulate 3 feet.  Moderate assist upper body bathing max is lower body bathing mod assist upper body dressing max assist lower body dressing minimal assist toilet transfers  Physical exam.  Blood pressure 133/50 pulse 81 temperature 98.1 respirations  17 oxygen saturation 96% room air Constitutional.  Frail-appearing older man HEENT Head.  Normocephalic and atraumatic Eyes.  Pupils round and reactive to light no nystagmus without discharge Neck.  Supple nontender no JVD trach site well-healed Cardiac RRR no JVD seen Respiratory no respiratory distress without wheeze.  Pleurx catheter in place Neurological alert and oriented mood was flat but appropriate No clonus or Hoffmann's noted bilateral lower extremities Upper extremities 4+/5 in deltoids biceps triceps, wrist extension grip and finger abduction  bilateral Right lower extremity hip flexors 2/5 knee extension 2 -/5 knee flexion 3+/5 dorsi and plantar flexion 4+/5 Left lower extremity hip flexors 2+/5 knee extension 4 -/5 knee flexion 4 -/5 dorsiflexion 4/5 plantar flexion 4+/5 Skin.  Stage I nonblanchable area around the anus on horseshoe around buttocks Abdomen.  Soft nontender positive bowel sounds  He/  has had improvement in activity tolerance, balance, postural control as well as ability to compensate for deficits. He/ has had improvement in functional use RUE/LUE  and RLE/LLE as well as improvement in awareness.  Supine to sit with supervision and use of bed features.  Patient performs reps of sit to stand elevated head of bed with min mod assist.  Patient perform sit to stand with moderate assist up-and-down times one 6 inch step.  Sternal precautions as indicated.  Patient ambulates short household distances with rolling walker monitoring of oxygen saturations could increase his ambulation up to 40 feet.  Set up for upper body bathing dressing max assist lower body bathing dressing.  Speech therapy follow-up needed some visual cues for semicomplex problem-solving only supervision assist required for verbal problem solving various safety related questions.  Full family teaching completed plan discharge to home       Disposition: Discharge to home    Diet: Renal diet  Special Instructions: No driving smoking or alcohol  Continue hemodialysis as directed  Weekly drainage of Pleurx catheter  Medications at discharge  1.  Aspirin 81 mg p.o. daily 2.  Aranesp weekly with hemodialysis 3.  Pepcid 10 mg p.o. daily 4.  Reglan 5 mg p.o. 3 times daily 5.  Rena-Vite 1 tablet nightly 6.  Oxycodone 5 mg every 4 hours as needed pain 7.  Scopolamine patch 1.5 mg over 72 hours 8.mylicon 80mg  TID 9.  Lipitor 10 mg daily  30-35 minutes were spent completing discharge summary and discharge planning    Discharge Instructions      Ambulatory referral to Physical Medicine Rehab   Complete by: As directed    Follow-up 1 month debility/CABG/multimedical       Follow-up Information     Jamse Arn, MD Follow up.   Specialty: Physical Medicine and Rehabilitation Why: Office to call for appointment Contact information: [address] [place] [address] [phone]         Ivin Poot, MD. Go on 06/27/2019.   Specialty: Cardiothoracic Surgery Why: PA/LAT CXR to be taken (at [facility] which is in the same building as Dr. Lucianne Lei Trigt's office) on 05/12 at 2:30 pm;Appointment time is at 3:00 pm Contact information: [place] [address] [phone]         Wilford Corner, MD Follow up.   Specialty: Gastroenterology Why: Call for appointment Contact information: [address] [place] [address] [phone]         Bjorn Loser, MD Follow up.   Specialty: Urology Why: Call for appointment Contact information: [phone]  [place] [address] [phone]         Elmarie Shiley, MD Follow up.   Specialty: Nephrology Why: Call for appointment Contact information: [place] [address] [phone]         Herminio Commons, MD Follow up.   Specialty: Cardiology Why: Call for appointment Contact information: [place] [address] [phone]            Signed: Lavon Paganini [place] 06/14/2019, 5:01 AM Patient was seen, face-face, and physical exam performed by me on day of discharge, greater than 30 minutes of total time spent.. Please see progress note from day of discharge as well.  Delice Lesch, MD, ABPMR

## 2019-06-12 NOTE — Progress Notes (Signed)
Physical Therapy Session Note  Patient Details  Name: Jesse Murphy MRN: [id number] Date of Birth: Aug 13, 1928  Today's Date: 06/12/2019 PT Individual Time: 1431-1516 PT Individual Time Calculation (min): 45 min   Short Term Goals: Week 3:  PT Short Term Goal 1 (Week 3): Pt will perform sit to stand with minA and LRAD PT Short Term Goal 2 (Week 3): Pt will perform bed ot chair transfer with minA and [place]. PT Short Term Goal 3 (Week 3): Pt will ambulate 32' with minA and LRAD.  Skilled Therapeutic Interventions/Progress Updates:   Pt received seated in w/c with his son and daughter present for hands-on family education and training. Pt's family confirms they are arranging for pt to have 24hr support at discharge. Pt's daughter reports that she cares for the pt's husband and is familiar with providing hands-on assist. Pt's family confirms he has ramped entrance to the home. Pt agreeable to therapy session though appears pale and is coughing up yellow mucous. Therapist educated pt's family on his CLOF with recommendation to perform primarily stand pivot transfers to/from w/c using RW and liming ambulation <26ft using RW with carefully though out set-up of environment. Therapist educated on recommended DME and follow-up therapy. Therapist educated on proper positioning in the bed with use of PRAFO boots for ankle DF and heel pressure relief, gait belt, and proper family positioning to provide pt assistance during functional mobility. Pt reports he does not feel well but will attempt as much physical participation as possible. Transported to/from gym in w/c for time management and energy conservation. Simulated car transfer (sedan height) via stand pivot using RW with pt's family providing hands-on min assist and therapist educating and cuing for proper set-up and sequencing as well as proper positioning to assist pt - family demonstrates understanding. Therapist educated pt's family on w/c part management  to place it in/out of a vehicle. Pt requesting to return to bed. Stand pivot to EOB using RW with pt's family again providing proper min assist and cuing to pt for proper sequencing/technique with min cuing from therapist. Sit>supine with min assist for B LE managaement into the bed. Pt/family report no questions/concerns at this time and report feeling confident to assist pt at discharge. Pt left supine in bed with needs in reach and bed alarm on.  Therapy Documentation Precautions:  Precautions Precautions: Fall Precaution Booklet Issued: Yes (comment) Precaution Comments: monitor for orthostatic hypotension Restrictions Weight Bearing Restrictions: No RUE Weight Bearing: Weight bearing as tolerated RUE Partial Weight Bearing Percentage or Pounds: No restrictions Other Position/Activity Restrictions: no longer has sternal precautions or UE PWB precautions - confirmed with Dr. Dagoberto Ligas on 4/9  Pain: No reports of pain throughout session.   Therapy/Group: Individual Therapy  Tawana Scale, PT, DPT 06/12/2019, 3:29 PM

## 2019-06-12 NOTE — Progress Notes (Signed)
Speech Language Pathology Daily Session Note  Patient Details  Name: Jesse Murphy MRN: [id number] Date of Birth: [birth year]  Today's Date: 06/12/2019 SLP Individual Time: 8250-0370 SLP Individual Time Calculation (min): 29 min  Short Term Goals: Week 3: SLP Short Term Goal 1 (Week 3): STG=LTG due to remaining LOS  Skilled Therapeutic Interventions: Pt was seen for skilled ST targeting education with pt and his son and daughter. SLP facilitated session with functional conversation regarding ST goals and progress throughout admission. Verbal review of swallow precautions and diet recommendations provided. Discussed that chin tuck is not required anymore, but if coughing is noted or becomes more frequent to use chin tuck as compensatory strategy given that it was effective to provide additional airway protection on MBSS. Also advised them to visit the MD if changes in swallowing are observed, and that he should still have intermittent supervision during meals. Also reinforced recommendation for 24/7 supervision for safety and discussed the impact of current cognitive deficits on functioning at home. Recommended that pt's family provide assistance with complex tasks such as finance and medication management and recommendation for follow up [place] to assess potential for him to become more independent with those tasks again. Also discussed reducing distractions and avoiding multitasking for better processing and sustained attention to tasks. All questions were answered to pt and family's satisfaction. Pt left sitting in chair with family still present and needs met. Continue per current plan of care.        Pain Pain Assessment Pain Scale: 0-10 Pain Score: 0-No pain  Therapy/Group: Individual Therapy  Arbutus Leas 06/12/2019, 7:06 AM

## 2019-06-12 NOTE — Progress Notes (Signed)
Occupational Therapy Session Note  Patient Details  Name: Jesse Murphy MRN: [id number] Date of Birth: Oct 15, 1928  Today's Date: 06/12/2019 OT Individual Time: 1300-1345 OT Individual Time Calculation (min): 45 min    Short Term Goals: Week 3:  OT Short Term Goal 1 (Week 3): STGs = LTGs  Skilled Therapeutic Interventions/Progress Updates:    Pt seen this session for family education with his daughter and son. Pt in good spirits, joking around, and very excited to be going home soon.   Discussed energy conservation with scheduling his baths on days he does not have dialysis and spacing out his physical activity so he does not get over fatigued.  Pt on 2L of O2 at 100%. Removed O2 and pt able to tolerate room air well, maintaining O2 sats at 96-98%.    Demonstrated to family the 3 steps pt needs to do for a successful sit to stand and how they will need to frequently cue him as he has difficulty with carry over (scoot to edge of seat, place feet under knees, lean forward and push up with B hands)  His son practiced standing him from w.c and then used RW to stand step to [facility] with min A.  Then his daughter practiced standing him from [facility] and stepping to w/c with min A.    Pt participated well. Family and pt taken to ADL apt to demonstrate how to set up a tub bench and transfer on and off tub bench, including placement of shower curtain. Discussed placement of grab bars.    Family plans to get a bench on their own, unless they change their minds and ask the SW to order one for them.    Family waiting in family room with patient as his room is not accessible right now.    Therapy Documentation Precautions:  Precautions Precautions: Fall Precaution Booklet Issued: Yes (comment) Precaution Comments: monitor for orthostatic hypotension Restrictions Weight Bearing Restrictions: No RUE Weight Bearing: Weight bearing as tolerated RUE Partial Weight Bearing Percentage or Pounds: No  restrictions Other Position/Activity Restrictions: no longer has sternal precautions or UE PWB precautions - confirmed with Dr. Dagoberto Ligas on 4/9    Vital Signs: Oxygen Therapy SpO2: 91 % O2 Device: Nasal Cannula O2 Flow Rate (L/min): 1 L/min FiO2 (%): 24 % Patient Activity (if Appropriate): In chair Pulse Oximetry Type: Intermittent Pain:   ADL: ADL Upper Body Bathing: Setup Where Assessed-Upper Body Bathing: Edge of bed Lower Body Bathing: Maximal assistance Where Assessed-Lower Body Bathing: Edge of bed Upper Body Dressing: Moderate assistance Where Assessed-Upper Body Dressing: Edge of bed Lower Body Dressing: Dependent Where Assessed-Lower Body Dressing: Edge of bed Toileting: Dependent Where Assessed-Toileting: Bedside Commode Toilet Transfer: Moderate assistance Toilet Transfer Method: Stand pivot Toilet Transfer Equipment: Bedside commode  Therapy/Group: Individual Therapy  [place] 06/12/2019, 12:32 PM

## 2019-06-13 ENCOUNTER — Inpatient Hospital Stay (HOSPITAL_COMMUNITY): Payer: Medicare Other

## 2019-06-13 ENCOUNTER — Inpatient Hospital Stay (HOSPITAL_COMMUNITY): Payer: Medicare Other | Admitting: Occupational Therapy

## 2019-06-13 ENCOUNTER — Inpatient Hospital Stay (HOSPITAL_COMMUNITY): Payer: Medicare Other | Admitting: Speech Pathology

## 2019-06-13 LAB — RENAL FUNCTION PANEL
Albumin: 1.9 g/dL — ABNORMAL LOW (ref 3.5–5.0)
Anion gap: 9 (ref 5–15)
BUN: 29 mg/dL — ABNORMAL HIGH (ref 8–23)
CO2: 27 mmol/L (ref 22–32)
Calcium: 8 mg/dL — ABNORMAL LOW (ref 8.9–10.3)
Chloride: 99 mmol/L (ref 98–111)
Creatinine, Ser: 5.65 mg/dL — ABNORMAL HIGH (ref 0.61–1.24)
GFR calc Af Amer: 10 mL/min — ABNORMAL LOW (ref >60)
GFR calc non Af Amer: 8 mL/min — ABNORMAL LOW (ref >60)
Glucose, Bld: 95 mg/dL (ref 70–99)
Phosphorus: 2.7 mg/dL (ref 2.5–4.6)
Potassium: 4.9 mmol/L (ref 3.5–5.1)
Sodium: 135 mmol/L (ref 135–145)

## 2019-06-13 LAB — CBC
HCT: 25.2 % — ABNORMAL LOW (ref 39.0–52.0)
Hemoglobin: 7.5 g/dL — ABNORMAL LOW (ref 13.0–17.0)
MCH: 29.6 pg (ref 26.0–34.0)
MCHC: 29.8 g/dL — ABNORMAL LOW (ref 30.0–36.0)
MCV: 99.6 fL (ref 80.0–100.0)
Platelets: 147 10*3/uL — ABNORMAL LOW (ref 150–400)
RBC: 2.53 MIL/uL — ABNORMAL LOW (ref 4.22–5.81)
RDW: 19.8 % — ABNORMAL HIGH (ref 11.5–15.5)
WBC: 5.2 10*3/uL (ref 4.0–10.5)
nRBC: 0 % (ref 0.0–0.2)

## 2019-06-13 LAB — PREPARE RBC (CROSSMATCH)

## 2019-06-13 LAB — GLUCOSE, CAPILLARY
Glucose-Capillary: 94 mg/dL (ref 70–99)
Glucose-Capillary: 79 mg/dL (ref 70–99)
Glucose-Capillary: 83 mg/dL (ref 70–99)
Glucose-Capillary: 104 mg/dL — ABNORMAL HIGH (ref 70–99)

## 2019-06-13 MED ORDER — ACETAMINOPHEN 160 MG/5ML PO SOLN: 325.0000 mg | ORAL | 0 refills | Status: DC | PRN

## 2019-06-13 MED ORDER — PENTAFLUOROPROP-TETRAFLUOROETH EX AERO: 1.0000 "application " | INHALATION_SPRAY | CUTANEOUS | Status: DC | PRN

## 2019-06-13 MED ORDER — DARBEPOETIN ALFA 150 MCG/0.3ML IJ SOSY
PREFILLED_SYRINGE | INTRAMUSCULAR | Status: AC
  Administered 2019-06-13: 150 ug via INTRAVENOUS
  Filled 2019-06-13: qty 0.3

## 2019-06-13 MED ORDER — FAMOTIDINE 10 MG PO TABS: 10.0000 mg | ORAL_TABLET | Freq: Every day | ORAL | 0 refills | Status: DC

## 2019-06-13 MED ORDER — SODIUM CHLORIDE 0.9 % IV SOLN: 100.0000 mL | INTRAVENOUS | Status: DC | PRN

## 2019-06-13 MED ORDER — OXYCODONE HCL 5 MG PO TABS: 5.0000 mg | ORAL_TABLET | ORAL | Status: DC | PRN

## 2019-06-13 MED ORDER — OXYCODONE HCL 5 MG PO TABS: 5.0000 mg | ORAL_TABLET | ORAL | 0 refills | Status: DC | PRN

## 2019-06-13 MED ORDER — LIDOCAINE HCL (PF) 1 % IJ SOLN: 5.0000 mL | INTRAMUSCULAR | Status: DC | PRN

## 2019-06-13 MED ORDER — ATORVASTATIN CALCIUM 10 MG PO TABS: 10.0000 mg | ORAL_TABLET | Freq: Every day | ORAL | 11 refills | Status: DC

## 2019-06-13 MED ORDER — ALTEPLASE 2 MG IJ SOLR: 2.0000 mg | Freq: Once | INTRAMUSCULAR | Status: AC | PRN

## 2019-06-13 MED ORDER — SODIUM CHLORIDE 0.9% IV SOLUTION: Freq: Once | INTRAVENOUS | Status: AC

## 2019-06-13 MED ORDER — SODIUM CHLORIDE 0.9 % IV SOLN: 125.0000 mg | INTRAVENOUS | Status: DC

## 2019-06-13 MED ORDER — LIDOCAINE-PRILOCAINE 2.5-2.5 % EX CREA: 1.0000 "application " | TOPICAL_CREAM | CUTANEOUS | Status: DC | PRN

## 2019-06-13 MED ORDER — ALTEPLASE 2 MG IJ SOLR
INTRAMUSCULAR | Status: AC
  Administered 2019-06-13: 4 mg
  Filled 2019-06-13: qty 4

## 2019-06-13 MED ORDER — GERHARDT'S BUTT CREAM: 1.0000 "application " | TOPICAL_CREAM | Freq: Two times a day (BID) | CUTANEOUS | 0 refills | Status: DC

## 2019-06-13 MED ORDER — DARBEPOETIN ALFA 150 MCG/0.3ML IJ SOSY: 150.0000 ug | PREFILLED_SYRINGE | INTRAMUSCULAR | Status: DC

## 2019-06-13 MED ORDER — HYDROCORTISONE 1 % EX CREA: TOPICAL_CREAM | Freq: Two times a day (BID) | CUTANEOUS | 0 refills | Status: DC

## 2019-06-13 MED ORDER — RENA-VITE PO TABS: 1.0000 | ORAL_TABLET | Freq: Every day | ORAL | 0 refills | Status: DC

## 2019-06-13 MED ORDER — METOCLOPRAMIDE HCL 5 MG PO TABS: 5.0000 mg | ORAL_TABLET | Freq: Three times a day (TID) | ORAL | 0 refills | Status: DC

## 2019-06-13 MED ORDER — SCOPOLAMINE 1 MG/3DAYS TD PT72: 1.0000 | MEDICATED_PATCH | TRANSDERMAL | 12 refills | Status: DC

## 2019-06-13 NOTE — Discharge Instructions (Addendum)
Inpatient Rehab Discharge Instructions  DERREN SUYDAM Discharge date and time: No discharge date for patient encounter.   Activities/Precautions/ Functional Status: Activity: activity as tolerated Diet: Renal diet Wound Care: keep wound clean and dry Functional status:  ___ No restrictions     ___ Walk up steps independently ___ 24/7 supervision/assistance   ___ Walk up steps with assistance ___ Intermittent supervision/assistance  ___ Bathe/dress independently ___ Walk with walker     _x__ Bathe/dress with assistance ___ Walk Independently    ___ Shower independently ___ Walk with assistance    ___ Shower with assistance ___ No alcohol     ___ Return to work/school ________  COMMUNITY REFERRALS UPON DISCHARGE:  Home Health: PT,  OT,  [place]  Outpatient:  [facility] in [place] for HD  Agency:  [facility]  Phone:[phone]  Appointment Date/Time:Saturday 06/16/19 @ 12:30 p.m.  Medical Equipment/Items Ordered:Home Oxygen, W/C, 3n1 commode  Agency/Supplier:[facility]   Special Instructions: No driving smoking or alcohol  Continue hemodialysis as directed   Weekly drainage of Pleurx catheter   My questions have been answered and I understand these instructions. I will adhere to these goals and the provided educational materials after my discharge from the hospital.  Patient/Caregiver Signature _______________________________ Date __________  Clinician Signature _______________________________________ Date __________  Please bring this form and your medication list with you to all your follow-up doctor's appointments.

## 2019-06-13 NOTE — Progress Notes (Signed)
Admit: 05/22/2019 LOS: 17  46M ESRD after cardiogenic shock; CAD s/p CABG; in CIR.    Subjective:  Seen and examined on dialysis.  Blood pressure 126/60 and HR 82.  Tolerating goal. Sitting comfortably in chair.  BF is running at 400.  Tunneled catheter.  He hasn't gotten his blood yet - does agree to receive as we discussed (consent on 4/27).   Review of systems:  Denies n/v Denies shortness of breath or chest pain  04/27 0701 - 04/28 0700 In: 368 [P.O.:358; I.V.:10] Out: 350   Filed Weights   06/11/19 1814 06/12/19 0644 06/13/19 0423  Weight: 77.7 kg 80.6 kg 80.2 kg    Scheduled Meds: . aspirin  81 mg Oral Daily  . Chlorhexidine Gluconate Cloth  6 each Topical BID  . darbepoetin (ARANESP) injection - DIALYSIS  150 mcg Intravenous Q Wed-HD  . famotidine  10 mg Oral Daily  . feeding supplement (NEPRO CARB STEADY)  237 mL Oral BID BM  . feeding supplement (PRO-STAT SUGAR FREE 64)  30 mL Oral BID  . Gerhardt's butt cream   Topical BID  . hydrocortisone   Rectal BID  . hydrocortisone  25 mg Rectal BID  . hydrocortisone cream   Topical BID  . insulin aspart  0-15 Units Subcutaneous TID WC  . metoCLOPramide  5 mg Oral TID AC & HS  . multivitamin  1 tablet Oral QHS  . nutrition supplement (JUVEN)  1 packet Oral BID BM  . ondansetron  4 mg Oral Once  . scopolamine  1 patch Transdermal Q72H  . simethicone  80 mg Oral TID  . sodium chloride flush  10-40 mL Intracatheter Q12H   Continuous Infusions: . sodium chloride    . sodium chloride    . anticoagulant sodium citrate    . ferric gluconate (FERRLECIT/NULECIT) IV 125 mg (06/11/19 1602)   PRN Meds:.sodium chloride, sodium chloride, [DISCONTINUED] oxyCODONE **AND** acetaminophen, alteplase, bisacodyl, camphor-menthol, diphenhydrAMINE, lidocaine (PF), lidocaine, lidocaine-prilocaine, ondansetron, oxyCODONE, pentafluoroprop-tetrafluoroeth, promethazine, sodium chloride, sodium chloride flush, sorbitol  Current Labs: reviewed   Results for MUAZ, SHOREY (MRN [id number]) as of 06/09/2019 11:01  Ref. Range 06/08/2019 16:22  Saturation Ratios Latest Ref Range: 17.9 - 39.5 % 21  Ferritin Latest Ref Range: 24 - 336 ng/mL 855 (H)    Physical Exam:  Blood pressure (!) 128/52, pulse 85, temperature 98.4 F (36.9 C), temperature source Oral, resp. rate 20, height 5\' 8"  (1.727 m), weight 80.2 kg, SpO2 91 %. NAD in chair RRR nl s1s2 CTAB L IJ TDC No rashes NCAT EOMI abd soft nt nd Extremities trace edema  A 1. ESRD, new during this admission; AKI from #2; 1. TDC dep't at this time.  Note post-op CABG 2. CLIP THS RKC start 5/1.   3. HD today as per previously charted plans then transition to TTS schedule after that for first treatment outpatient on 5/1.  Catheter tpa'd  4. Change to renal diet.   5. Note sign above pt's bed to lock with citrate rather than heparin. Hx HIT.  Spoke with PA to notify unit; she spoke with unit and notified them of same. 2. S/p cardiogenic shock; CAD s/p CABG 3. Debility in CIR 4. Anemia, trending down,  1. aranesp at 150 mcg for next dose on 4/28 2. On IV iron with HD 3. PRBC's on HD on 4/28. 5. CKD-BMD: phos normal and had been low; holding PhosLo     Recent Labs  Lab 06/07/19 2117 06/09/19 1336  06/11/19 1350  NA 135 136 136  K 5.7* 5.4* 4.6  CL 98 99 99  CO2 27 26 26   GLUCOSE 108* 95 143*  BUN 43* 36* 18  CREATININE 6.86* 6.24* 4.41*  CALCIUM 7.9* 8.0* 7.9*  PHOS 3.8 3.8 2.4*   Recent Labs  Lab 06/07/19 2117 06/09/19 1335 06/11/19 1350  WBC 7.8 6.0 6.3  HGB 7.9* 8.1* 7.8*  HCT 26.8* 28.3* 26.3*  MCV 96.1 98.6 98.5  PLT 122* 122* 152      Claudia Desanctis, MD  06/13/2019 8:43 AM

## 2019-06-13 NOTE — Progress Notes (Signed)
Occupational Therapy Session Note  Patient Details  Name: Jesse Murphy MRN: [id number] Date of Birth: 26-Mar-1928  Today's Date: 06/13/2019 OT Individual Time: 1300-1345 OT Individual Time Calculation (min): 45 min  ( MAKEUP MINUTES)  Short Term Goals: Week 1:  OT Short Term Goal 1 (Week 1): Pt will be able to rise to stand with min - mod A to prep for clothing management with toileting. OT Short Term Goal 1 - Progress (Week 1): Progressing toward goal OT Short Term Goal 2 (Week 1): Pt will be able to squat pivot to toilet or BSC with mod- max A. OT Short Term Goal 2 - Progress (Week 1): Progressing toward goal OT Short Term Goal 3 (Week 1): Pt will don shirt with set up. OT Short Term Goal 3 - Progress (Week 1): Progressing toward goal OT Short Term Goal 4 (Week 1): Pt will don pants with mod-max A. OT Short Term Goal 4 - Progress (Week 1): Progressing toward goal Week 2:  OT Short Term Goal 1 (Week 2): Pt will rise to stand with mod A of 1 to RW to prepare for LB self care. OT Short Term Goal 1 - Progress (Week 2): Met OT Short Term Goal 2 (Week 2): Pt will complete a stand pivot to [facility] with RW with min A. OT Short Term Goal 2 - Progress (Week 2): Met OT Short Term Goal 3 (Week 2): Pt will be able to don a shirt with set up. OT Short Term Goal 3 - Progress (Week 2): Progressing toward goal OT Short Term Goal 4 (Week 2): Pt will demonstrate improved activity tolerance to stay up in his wc for at least 1 hour at a time. OT Short Term Goal 4 - Progress (Week 2): Met Week 3:  OT Short Term Goal 1 (Week 3): STGs = LTGs  Skilled Therapeutic Interventions/Progress Updates:    Pt scheduled for OT at 1015, but pt off unit for dialysis.  Rescheduled for the afternoon. At 1300, pt in bed sound asleep with son in the room. Attempted to wake patient but he was too lethargic.   Spent this time in family education with son answering several questions. Son had not seen a hoyer lift, which pt will  be using at dialysis. Obtained the hoyer and sling to practice with patient but he was still to tired to wake up and try.  Demonstrated to son how sling is applied around pt and then on lift and important tips when he is placed back into the chair for positioning.  Also discussed his rehab progression. Son concerned pt is not ready to go home as he still requires assist.  Reviewed his long hospitalization, recovery time required, pt's progression.  Pt has made minimal progress in the last several weeks. He is capable of standing with min A and taking several steps, but fatigues quickly so family understands they will have to pace and plan activities carefully.  Even in 2 more weeks, pt would likely continue to need the same level of A.  Pt will need months of recovery and with the prolonged hospital stay pt has demonstrated some learned helplessness.    The home environment (where he can see his wife), should help him to focus on see why he needs to gain more strength.  Pt will have home health therapies.    Son understood and is aware that [facility] will also be overseeing his care.    Pt in bed continuing to rest  with all needs met.     Therapy Documentation Precautions:  Precautions Precautions: Fall Precaution Booklet Issued: Yes (comment) Precaution Comments: monitor for orthostatic hypotension Restrictions Weight Bearing Restrictions: No RUE Weight Bearing: Weight bearing as tolerated RUE Partial Weight Bearing Percentage or Pounds: No restrictions Other Position/Activity Restrictions: no longer has sternal precautions or UE PWB precautions - confirmed with Dr. Dagoberto Ligas on 4/9    Vital Signs: Therapy Vitals Temp: 98.2 F (36.8 C) Temp Source: Oral Pulse Rate: 95 Resp: 18 BP: (!) 105/47 Patient Position (if appropriate): Sitting Oxygen Therapy SpO2: 97 % O2 Device: Room Air Pain: Pain Assessment Pain Scale: 0-10 Pain Score: 0-No pain ADL: ADL Eating:  Supervision/safety Grooming: Setup Upper Body Bathing: Setup Where Assessed-Upper Body Bathing: Edge of bed Lower Body Bathing: Moderate assistance Where Assessed-Lower Body Bathing: Edge of bed Upper Body Dressing: Minimal assistance Where Assessed-Upper Body Dressing: Edge of bed Lower Body Dressing: Moderate assistance Where Assessed-Lower Body Dressing: Edge of bed Toileting: Moderate assistance Where Assessed-Toileting: Bedside Commode Toilet Transfer: Minimal assistance Toilet Transfer Method: Stand pivot Toilet Transfer Equipment: Bedside commode   Therapy/Group: Individual Therapy  Jesse Murphy 06/13/2019, 3:03 PM

## 2019-06-13 NOTE — Patient Care Conference (Signed)
Inpatient RehabilitationTeam Conference and Plan of Care Update Date: 06/13/2019   Time: 11:15 AM    Patient Name: Jesse Murphy      Medical Record Number: [id number]  Date of Birth: Jun 23, 1928 Sex: Male         Room/Bed: 4W25C/4W25C-01 Payor Info: Payor: MEDICARE / Plan: MEDICARE PART A AND B / Product Type: *No Product type* /    Admit Date/Time:  05/22/2019  3:48 PM  Primary Diagnosis:  Debility  Patient Active Problem List   Diagnosis Date Noted  . Adult failure to thrive   . History of non-Hodgkin's lymphoma   . Chest tube in place   . Failure to thrive (child)   . Prediabetes   . Hypoalbuminemia due to protein-calorie malnutrition ([place])   . Failure to thrive in adult   . Allergic contact dermatitis   . ESRD on dialysis ([place])   . Dysphagia   . Thrombocytopenia ([place])   . Acute on chronic anemia   . Supplemental oxygen dependent   . Acute systolic congestive heart failure ([place])   . Pressure ulcer 05/23/2019  . Debility 05/22/2019  . AKI (acute kidney injury) ([place])   . Palliative care encounter   . Pressure injury of skin 03/25/2019  . Acute respiratory failure with hypoxia ([place])   . S/P CABG x 3 03/16/2019  . Non-ST elevation (NSTEMI) myocardial infarction ([place])   . Acute on chronic combined systolic and diastolic CHF (congestive heart failure) ([place])   . Coronary artery disease due to lipid rich plaque   . Atrial fibrillation with RVR ([place]) 03/14/2019  . Atrial fibrillation with rapid ventricular response ([place]) 03/13/2019  . Chest pain 09/12/2018  . Chronic diastolic heart failure ([place]) 05/29/2018  . Hypertension 05/29/2018  . Non Hodgkin's lymphoma ([place]) 09/30/2017  . Heme positive stool 08/11/2017  . Chronic anticoagulation 07/05/2016  . CAD S/P percutaneous coronary angioplasty 07/05/2016  . Type 2 diabetes mellitus without complications ([place]) 71/24/5809  . PAF (paroxysmal atrial fibrillation) ([place]) 06/14/2016  . Chest pain in adult 06/14/2016  . Dyspnea on  exertion 06/14/2016  . Allergic rhinitis 06/14/2016  . GERD (gastroesophageal reflux disease) 06/14/2016  . BPH (benign prostatic hyperplasia) 06/14/2016    Expected Discharge Date: Expected Discharge Date: 06/14/19  Team Members Present: Physician leading conference: Dr. Delice Lesch Care Coodinator Present: Nestor Lewandowsky, RN, BSN, CRRN;Christina Sampson Goon, BSW;Genie Allante Beane, RN, MSN Nurse Present: Serena Croissant, LPN PT Present: Page Spiro, PT;Carr Zenia Resides, PT OT Present: Meriel Pica, OT SLP Present: Jettie Booze, CF-SLP PPS Coordinator present : Ileana Ladd, Burna Mortimer, SLP     Current Status/Progress Goal Weekly Team Focus  Bowel/Bladder   Continent and incontinent of B/B 04/27  continent of B/B  toilet program   Swallow/Nutrition/ Hydration             ADL's   min UB dressing; mod -max LB dressing and toileting, min A to BSC  min A with self care, S with toilet or BSC transfer  ADL training, functional mobiliy, activity tolerance, pt/family education   Mobility   minA/modA bed mobility. minA/modA sit to stand with RW. minA ambulation ~50' with RW. Has had difficulty maintaining O2 sats >90% on room air. Also had R knee buckle requiring maxA to prevent fall.  supervision/CGA overall  family education, DC prep, bed mobility, transfer and gait training   Communication             Safety/Cognition/ Behavioral Observations  Supervision-Min  Supervision-Min  education, complex  problem solving for functional tasks   Pain   c/o pain in legs relieved with tylenol prn  pain <3/10  assess pain qshift and prn   Skin   Stage 2 Buttocks, DTI on R heel  improvement of injury no further breakdown  assess qshift prn    Rehab Goals Patient on target to meet rehab goals: Yes Rehab Goals Revised: Goals downgraded to min assist overall and son and daughter are aware and in agreement with the change *See Care Plan and progress notes for long and short-term goals.     Barriers to  Discharge  Current Status/Progress Possible Resolutions Date Resolved   Nursing                  PT                    OT                  SLP                SW Other (comments)(Pleur-X drain) Son working on ramp; son educated on pleur-x drain care HD T, TH, Sa at [facility] at discharge; declined RCAPP application, [place] set up and equipment ordered          Discharge Planning/Teaching Needs:  Home with wife and son; daughter to assist after work  Transfers, toileting, medications, diet, etc   Team Discussion: MD HD early today, med stable.  RN cont/inc, BM 4/27, leg pain, tylenol given, stage 2 on buttocka.  OT fam ed done, met goals.  PT downgraded to min A goals, on O2 1-2L during therapy, R knee buckled yesterday needed max A to prevent fall, fam ed done.  SLP downgraded to min A for prob solving goal, other goals at S.  May need O2 at home d/t desaturations.   Revisions to Treatment Plan: N/A     Medical Summary Current Status: Debility secondary to CAD status post CABG 03/15/2019. Weekly Focus/Goal: Improve mobility, wean supplemental oxygen, acute on chronic anemia, HD tolerance, heart failure  Barriers to Discharge: Medical stability;Hemodialysis;Other (comments)  Barriers to Discharge Comments: New HD Possible Resolutions to Barriers: Therapies, follow weights, follow nephro recs, follow labs   Continued Need for Acute Rehabilitation Level of Care: The patient requires daily medical management by a physician with specialized training in physical medicine and rehabilitation for the following reasons: Direction of a multidisciplinary physical rehabilitation program to maximize functional independence : Yes Medical management of patient stability for increased activity during participation in an intensive rehabilitation regime.: Yes Analysis of laboratory values and/or radiology reports with any subsequent need for medication adjustment and/or medical intervention.  : Yes   I attest that I was present, lead the team conference, and concur with the assessment and plan of the team.   [name] 06/13/2019, 10:24 PM   Team conference was held via web/ teleconference due to [place] - 19

## 2019-06-13 NOTE — Care Management (Addendum)
The overall goal for the admission was met for:   Discharge location: Home with wife and son/daughter to assist  Length of Stay: 18  Days with discharge on 06/14/19  Discharge activity level: Patient to discharge at an ambulatory level Min Assist. Patient to discharge at overall Mod Assist level.   Home/community participation:Limited Participation  Services provided included: MD, RD, PT, OT, SLP, RN, CM, TR, Pharmacy, Neuropsych and SW  Financial Services: Medicare and Private Insurance: [employer] for Life  Follow-up services arranged: Home Health: PT, OT, SLP, DME: Home O2, W/C, 3n1 and Patient/Family has no preference for HH/DME agencies  Comments (or additional information): [facility]  [phone]  Box of pleur-X bottles/bandages sent home with the patient for weekly drainage of chest tube drain. HH RN requested for follow up for Pleur-X drain  [facility]  Phone:[phone]  Appointment Date/Time:Saturday 06/16/19 @ 12:30 p.m.  Patient/Family verbalized understanding of follow-up arrangements: Yes   Family Education completed 06/12/19  Individual responsible for coordination of the follow-up plan: Son Don:[phone] Daughter Debbie:[phone]  Confirmed correct DME delivered: ,  B 06/13/2019    ,  B 

## 2019-06-13 NOTE — Progress Notes (Signed)
Physical Therapy Discharge Summary  Patient Details  Name: Jesse Murphy MRN: [id number] Date of Birth: Oct 21, 1928  Today's Date: 06/13/2019 PT Individual Time: 5573-2202 PT Individual Time Calculation (min): 20 min  and Today's Date: 06/13/2019 PT Missed Time: 60 Minutes Missed Time Reason: Unavailable (Comment)(in dialysis)   Patient has met 0 of 8 long term goals due to slow progress and limited endurance during therapy sessions, but has met 6 out of 7 revised long term goals (revised outside of 48 hour window prior to discharge) due to improved activity tolerance, improved balance, improved postural control and increased strength.  Patient to discharge at an ambulatory level [place].   Patient's care partner is independent to provide the necessary physical and cognitive assistance at discharge.  Reasons goals not met: Pt progress toward therapy goals limited by symptomatic orthostatic hypotension, limited activity tolerance, and generalized debility not progressing at typical rate.  Recommendation:  Patient will benefit from ongoing skilled PT services in home health setting to continue to advance safe functional mobility, address ongoing impairments in strength, bed mobility, balance, transfers, ambulation, endurance, and minimize fall risk.  Equipment: manual WC, RW  Reasons for discharge: treatment goals met and discharge from hospital  Patient/family agrees with progress made and goals achieved: Yes   Skilled Therapeutic Interventions: Pt out of room for scheduled PT session due to AM dialysis. Upon return pt very fatigued from dialysis and requests to rest at this time. PT provides education to son who is present on strategies for safe mobilization at home, frequency of activity, body mechanics for caregiver safety, use of incentive spirometer and pule oximeter, and assistance with bed mobility. PT answers all questions and concerns from son and assists with providing pt with  supine scoot toward [facility] with cues on technique.  Pt's discharge mobility otherwise taken from recent previous sessions and best clinical judgement.  PT Discharge Precautions/Restrictions Precautions Precautions: Fall Restrictions Weight Bearing Restrictions: No Vision/Perception  Perception Perception: Within Functional Limits Praxis Praxis: Intact  Cognition Overall Cognitive Status: Impaired/Different from baseline Orientation Level: Oriented X4 Memory: Impaired Sensation Sensation Light Touch: Appears Intact Hot/Cold: Appears Intact Proprioception: Appears Intact Stereognosis: Appears Intact Additional Comments: sensation grossly intact, slightly diminished in feet/toes Coordination Gross Motor Movements are Fluid and Coordinated: No Fine Motor Movements are Fluid and Coordinated: Yes Coordination and Movement Description: coordination impaired B LEs secondary to generalized weakness Motor  Motor Motor - Discharge Observations: Generalized weakness, fluctuates with decreased post dialysis on dialysis days  Mobility Bed Mobility Bed Mobility: Rolling Right;Rolling Left;Supine to Sit;Sit to Supine Rolling Right: Minimal Assistance - Patient > 75% Rolling Left: Minimal Assistance - Patient > 75% Supine to Sit: Minimal Assistance - Patient > 75% Sit to Supine: Minimal Assistance - Patient > 75% Transfers Transfers: Sit to Stand;Stand Pivot Transfers;Stand to Sit Sit to Stand: Minimal Assistance - Patient > 75% Stand to Sit: Minimal Assistance - Patient > 75% Stand Pivot Transfers: Minimal Assistance - Patient > 75% Stand Pivot Transfer Details: Verbal cues for sequencing;Verbal cues for safe use of DME/AE;Verbal cues for technique;Tactile cues for sequencing Transfer (Assistive device): Rolling walker Locomotion  Gait Ambulation: Yes Gait Assistance: Minimal Assistance - Patient > 75% Gait Distance (Feet): 50 Feet Assistive device: Rolling walker Gait Assistance  Details: Verbal cues for sequencing;Verbal cues for gait pattern;Verbal cues for technique;Tactile cues for sequencing Gait Gait: Yes Gait Pattern: Impaired Gait Pattern: Trunk flexed;Decreased trunk rotation;Step-through pattern;Decreased stride length Gait velocity: decr Stairs / Additional Locomotion Stairs: No  Wheelchair Mobility Wheelchair Mobility: No  Trunk/Postural Assessment  Postural Control Protective Responses: impaired  Balance Static Sitting Balance Static Sitting - Level of Assistance: 7: Independent Dynamic Sitting Balance Dynamic Sitting - Level of Assistance: 5: Stand by assistance Static Standing Balance Static Standing - Level of Assistance: 4: Min assist Dynamic Standing Balance Dynamic Standing - Level of Assistance: 4: Min assist Extremity Assessment  RUE Assessment Active Range of Motion (AROM) Comments: 80 degrees of sh flexion General Strength Comments: 4/5 LUE Assessment Active Range of Motion (AROM) Comments: 80 degrees of sh flexion General Strength Comments: 4/5 RLE Assessment RLE Assessment: Exceptions to [facility] Passive Range of Motion (PROM) Comments: tight heel cords/hamstrings General Strength Comments: Grossly 4/5 LLE Assessment LLE Assessment: Exceptions to [facility] Passive Range of Motion (PROM) Comments: tight heel cords/hamstrings General Strength Comments: grossly 4/5    Breck Coons 06/13/2019, 3:59 PM

## 2019-06-13 NOTE — Plan of Care (Signed)
  Problem: Consults Goal: RH GENERAL PATIENT EDUCATION Description: See Patient Education module for education specifics. Outcome: Progressing Goal: Skin Care Protocol Initiated - if Braden Score 18 or less Description: If consults are not indicated, leave blank or document N/A Outcome: Progressing Goal: Nutrition Consult-if indicated Outcome: Progressing Goal: Diabetes Guidelines if Diabetic/Glucose > 140 Description: If diabetic or lab glucose is > 140 mg/dl - Initiate Diabetes/Hyperglycemia Guidelines & Document Interventions  Outcome: Progressing   Problem: RH BOWEL ELIMINATION Goal: RH STG MANAGE BOWEL WITH ASSISTANCE Description: STG Manage Bowel with min Assistance. Outcome: Progressing Goal: RH STG MANAGE BOWEL W/MEDICATION W/ASSISTANCE Description: STG Manage Bowel with Medication with min Assistance. Outcome: Progressing   Problem: RH BLADDER ELIMINATION Goal: RH STG MANAGE BLADDER WITH ASSISTANCE Description: STG Manage Bladder With min/mod Assistance Outcome: Progressing   Problem: RH SKIN INTEGRITY Goal: RH STG SKIN FREE OF INFECTION/BREAKDOWN Description: Patients skin will remain free from FURTHER breakdown or infection with min/mod assist. Outcome: Progressing Goal: RH STG MAINTAIN SKIN INTEGRITY WITH ASSISTANCE Description: STG Maintain Skin Integrity With min/mod Assistance. Outcome: Progressing Goal: RH STG ABLE TO PERFORM INCISION/WOUND CARE W/ASSISTANCE Description: STG Able To Perform Incision/Wound Care With total assistance from caregiver. Outcome: Progressing   Problem: RH SAFETY Goal: RH STG ADHERE TO SAFETY PRECAUTIONS W/ASSISTANCE/DEVICE Description: STG Adhere to Safety Precautions With min Assistance/Device. Outcome: Progressing   Problem: RH PAIN MANAGEMENT Goal: RH STG PAIN MANAGED AT OR BELOW PT'S PAIN GOAL Description: Maintain pain control of 4 or less Outcome: Progressing

## 2019-06-13 NOTE — Progress Notes (Signed)
Occupational Therapy Discharge Summary  Patient Details  Name: Jesse Murphy MRN: [id number] Date of Birth: 1928/10/21   Patient has met 2 of 6 long term goals due to improved activity tolerance, improved balance, postural control and ability to compensate for deficits.  Patient to discharge at overall Mod Assist level.  Patient's care partner is independent to provide the necessary physical and cognitive assistance at discharge.    Reasons goals not met: Pt's goals were set at [facility] for standing balance and sit to stand (pt requires MIN A), Supervision for toilet transfer (Pt requires MIN A), min A with bathing (pt needs MOD A).   Pt has been at these levels for the last 2 weeks and has not progressed enough to be just S or CGA.  His son and daughter are able to assist him at this current level.  Recommendation:  Patient will benefit from ongoing skilled OT services in home health setting to continue to advance functional skills in the area of BADL.  Equipment: BSC (family will later purchase a tub bench for when his chest tube is removed)  Reasons for discharge: lack of progress toward goals and treatment goals met  - Pt DID meet some goals and DID make progress towards the other goals, he just was not able to achieve a S/CGA level.   Patient/family agrees with progress made and goals achieved: Yes  OT Discharge Precautions/Restrictions  Precautions Precautions: Fall Restrictions Weight Bearing Restrictions: No ADL ADL Eating: Supervision/safety Grooming: Setup Upper Body Bathing: Setup Where Assessed-Upper Body Bathing: Edge of bed Lower Body Bathing: Moderate assistance Where Assessed-Lower Body Bathing: Edge of bed Upper Body Dressing: Minimal assistance Where Assessed-Upper Body Dressing: Edge of bed Lower Body Dressing: Moderate assistance Where Assessed-Lower Body Dressing: Edge of bed Toileting: Moderate assistance Where Assessed-Toileting: Bedside Commode Toilet  Transfer: Minimal assistance Toilet Transfer Method: Stand pivot Toilet Transfer Equipment: Bedside commode Vision Patient Visual Report: No change from baseline Vision Assessment?: No apparent visual deficits Perception  Perception: Within Functional Limits Praxis Praxis: Intact Cognition Overall Cognitive Status: Impaired/Different from baseline Orientation Level: Oriented X4 Memory: Impaired Sensation Sensation Light Touch: Appears Intact Hot/Cold: Appears Intact Proprioception: Appears Intact Stereognosis: Appears Intact Additional Comments: sensation grossly intact, slightly diminished in feet/toes Coordination Gross Motor Movements are Fluid and Coordinated: No Fine Motor Movements are Fluid and Coordinated: Yes Coordination and Movement Description: coordination impaired B LEs secondary to generalized weakness Motor  Motor Motor - Discharge Observations: Generalized weakness, fluctuates with decreased post dialysis on dialysis days Mobility  Transfers Sit to Stand: Minimal Assistance - Patient > 75%  Trunk/Postural Assessment  Postural Control Protective Responses: impaired  Balance Static Sitting Balance Static Sitting - Level of Assistance: 7: Independent Dynamic Sitting Balance Dynamic Sitting - Level of Assistance: 5: Stand by assistance Static Standing Balance Static Standing - Level of Assistance: 4: Min assist Dynamic Standing Balance Dynamic Standing - Level of Assistance: 4: Min assist Extremity/Trunk Assessment RUE Assessment Active Range of Motion (AROM) Comments: 80 degrees of sh flexion General Strength Comments: 4/5 LUE Assessment Active Range of Motion (AROM) Comments: 80 degrees of sh flexion General Strength Comments: 4/5   Karis Emig 06/13/2019, 3:17 PM

## 2019-06-13 NOTE — Progress Notes (Signed)
[place] PHYSICAL MEDICINE & REHABILITATION PROGRESS NOTE  Subjective/Complaints: Patient seen sitting up at the edge of his bed this morning after returning from HD.  He states he tolerated sitting in the chair for dialysis.  He states he slept well overnight.  Son with questions regarding insurance coverage for hired caregiver support.  He was seen by nephrology yesterday, notes reviewed-dialysis per session this morning to evaluate tolerance.  He notes resolution of nausea and improvement in appetite.  ROS: Denies CP, SOB, N/V/D  Objective:   No results found. Recent Labs    06/11/19 1350 06/13/19 0823  WBC 6.3 5.2  HGB 7.8* 7.5*  HCT 26.3* 25.2*  PLT 152 147*   Recent Labs    06/11/19 1350 06/13/19 0823  NA 136 135  K 4.6 4.9  CL 99 99  CO2 26 27  GLUCOSE 143* 95  BUN 18 29*  CREATININE 4.41* 5.65*  CALCIUM 7.9* 8.0*    Intake/Output Summary (Last 24 hours) at 06/13/2019 1012 Last data filed at 06/12/2019 1700 Gross per 24 hour  Intake 250 ml  Output 350 ml  Net -100 ml     Physical Exam: Vital Signs Blood pressure (!) 111/48, pulse 83, temperature 98.4 F (36.9 C), temperature source Oral, resp. rate 17, height 5\' 8"  (1.727 m), weight 80.2 kg, SpO2 91 %.  Constitutional: No distress . Vital signs reviewed. HENT: Normocephalic.  Atraumatic. Eyes: EOMI. No discharge. Cardiovascular: No JVD. Respiratory: Normal effort.  No stridor.  + [place]. GI: Non-distended. Skin: Warm and dry.  Intact. Psych: Normal mood.  Normal behavior. Musc: Improving edema Neurologic: Alert Motor: 4/5 throughout, unchanged  Assessment/Plan: 1. Functional deficits secondary to debility which require 3+ hours per day of interdisciplinary therapy in a comprehensive inpatient rehab setting.  Physiatrist is providing close team supervision and 24 hour management of active medical problems listed below.  Physiatrist and rehab team continue to assess barriers to discharge/monitor patient  progress toward functional and medical goals  Care Tool:  Bathing    Body parts bathed by patient: Right arm, Left arm, Chest, Abdomen, Face, Front perineal area, Right upper leg, Left upper leg   Body parts bathed by helper: Right lower leg, Left lower leg, Buttocks     Bathing assist Assist Level: Moderate Assistance - Patient 50 - 74%     Upper Body Dressing/Undressing Upper body dressing   What is the patient wearing?: Pull over shirt    Upper body assist Assist Level: Minimal Assistance - Patient > 75%    Lower Body Dressing/Undressing Lower body dressing      What is the patient wearing?: Incontinence brief, Pants     Lower body assist Assist for lower body dressing: Moderate Assistance - Patient 50 - 74%     Toileting Toileting    Toileting assist Assist for toileting: Total Assistance - Patient < 25%     Transfers Chair/bed transfer  Transfers assist  Chair/bed transfer activity did not occur: Safety/medical concerns(nausea/dizziness/fatigue)  Chair/bed transfer assist level: Minimal Assistance - Patient > 75% Chair/bed transfer assistive device: Programmer, multimedia   Ambulation assist   Ambulation activity did not occur: Safety/medical concerns(nausea/dizziness/fatigue)  Assist level: Minimal Assistance - Patient > 75% Assistive device: Walker-rolling Max distance: 58'   Walk 10 feet activity   Assist  Walk 10 feet activity did not occur: Safety/medical concerns  Assist level: Minimal Assistance - Patient > 75% Assistive device: Walker-rolling   Walk 50 feet activity  Assist Walk 50 feet with 2 turns activity did not occur: Safety/medical concerns  Assist level: Minimal Assistance - Patient > 75% Assistive device: Walker-rolling    Walk 150 feet activity   Assist Walk 150 feet activity did not occur: Safety/medical concerns         Walk 10 feet on uneven surface  activity   Assist Walk 10 feet on uneven  surfaces activity did not occur: Safety/medical concerns         Wheelchair     Assist Will patient use wheelchair at discharge?: No(sternal precautions) Type of Wheelchair: Manual    Wheelchair assist level: Supervision/Verbal cueing Max wheelchair distance: 50 ft    Wheelchair 50 feet with 2 turns activity    Assist        Assist Level: Supervision/Verbal cueing   Wheelchair 150 feet activity     Assist          Blood pressure (!) 111/48, pulse 83, temperature 98.4 F (36.9 C), temperature source Oral, resp. rate 17, height 5\' 8"  (1.727 m), weight 80.2 kg, SpO2 91 %.  Medical Problem List and Plan: 1.Debilitysecondary to CAD status post CABG 03/15/2019. Impella device removed 04/10/2019. Sternal precautions  Continue CIR  Team conference today to discuss current and goals and coordination of care, home and environmental barriers, and discharge planning with nursing, case manager, and therapies.   Chest x-ray  showing increase effusion-maintain Pleurx as outpatient per CTS. 2. Antithrombotics: -DVT/anticoagulation:SCDs- due to hematuria -antiplatelet therapy: Aspirin 81 mg daily 3. Pain Management:Oxycodone as needed  Controlled on 4/16 4. Mood:Provide emotional support   Remeron started on 4/14 with improvement -antipsychotic agents: N/A 5. Neuropsych: This patientiscapable of making decisions on hisown behalf. 6. Skin/Wound Care:Routine skin checks  Continue PRAFO's for bilateral lower extremities 7. Fluids/Electrolytes/Nutrition:Monitor I/Os 8. Acute systolic congestive heart failure/cardiogenic shock. Follow-up cardiology services Filed Weights   06/11/19 1814 06/12/19 0644 06/13/19 0423  Weight: 77.7 kg 80.6 kg 80.2 kg   Stable on 4/28 9. Acute hypoxic respiratory failure with Pseudomonas pneumonia. Status post tracheostomy 03/27/2019. Decannulated 04/28/2019.  Requiring supplemental oxygen again,?  Appears  to require intermittently 10.Acute on chronic anemia/thrombocytopenia. No more heparin for HD for now  Hemoglobin 7.5 on 4/28, labs with HD  Platelets 147 on 4/28, labs with HD  HIT negative 11. Dysphagia/nausea.   Advance to regular thins.  Follow-up speech therapy.Nasogastric tube/cortrakfor nutritional support.   Dietary follow-up- see dietary note dated 4/6 for additional information.   -PEG not safe option as per cardiothoracic surgery team, will consider follow-up with CTS if necessary.   4/9- Added Phenergan 12.5 mg Q6 hours prn for nausea since zofran doesn't workAdded scopolamine patch for nausea and dizziness.   KUB personally reviewed, bowel gas pattern.    Encourage patient to have son bring in food that patient enjoys and have it modified, reminded again  ECG reviewed, borderline QTC-trial low-dose Reglan with meals on 4/14, with improvement, DC'd on 4/19  Remeron started on 4/14  Patient states good p.o. intake, however inconsistent oral intake per documentation 12. 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 Patient has been transitioned to dialysis and being sent to OP HD after CIR, so is chronicand followed by renal services. Tunneled L IJcatheter placed 04/24/2019, exchanged 13. Hematuria. Cystoscopy completed with evacuation of clot. No further bleeding episodes per notes, intermittent bleeding.   Continue to monitor 14. Left pleural effusion. Status post Pleurx catheter 04/23/2019.PATIENT WILL KEEP PLEURX CATHETER FOR NOW PER CVTS. 15. Diffuse  drug rashversus contact dermatitis: Resolved  Steroid taper completed   Benadryl prn, Sarna lotion, hypoallergenic sheets.  16.Rectal bleeding. 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 Follow-up GI with conservative care no further bleeding reported.   See #10 17. PAF. Currently off amiodarone. No plan to resume any anticoagulation at this time until hematuria fully  resolved.   Vitals:   06/13/19 0423 06/13/19 0915  BP: (!) 128/52 (!) 111/48  Pulse: 85 83  Resp: 20 17  Temp: 98.4 F (36.9 C)   SpO2: 91%     Rate controlled on 4/28 18.  Prediabetes  Monitor with increased mobility   Controlled on 4/28 19.  Leukocytosis: Resolved  Afebrile 20.  Severe hypoalbuminemia  Supplement initiated, stressed importance again  LOS: 22 days A FACE TO FACE EVALUATION WAS PERFORMED  Caren Garske Lorie Phenix 06/13/2019, 10:12 AM

## 2019-06-13 NOTE — Progress Notes (Signed)
Speech Language Pathology Discharge Summary  Patient Details  Name: Jesse Murphy MRN: [id number] Date of Birth: 1928/03/11  Pt was in hemodialysis this morning during scheduled session and working with other therapies when reattemted visit, therefore pt missed 30 mins of skilled ST today.   Patient has met 4 of 4 long term goals.  Patient to discharge at overall Supervision;Min level.  Reasons goals not met: n/a   Clinical Impression/Discharge Summary:   Pt made functional gains and met 4 out of 4 long term goals this admission. Pt currently requires Supervision-Min assist for semi-complex functional tasks due to cognitive impairments impacting his problem solving, sustained attention, and short term recall. He will require 24/7 supervision at discharge. Pt is consuming regular texture diet with thin liquids and has demonstrated ability to fade out need for chin tuck compensatory maneuver. Pt has demonstrated improved oropharyngeal swallow function and basic to mildly complex problem solving as well as sustained attention to tasks. However, given cognitive deficits with a functional impact still present, recommend pt continue to receive skilled [place] services upon discharge. Pt and family education is complete at this time.    Care Partner:  Caregiver Able to Provide Assistance: Yes  Type of Caregiver Assistance: Cognitive  Recommendation:  Home Health SLP;24 hour supervision/assistance  Rationale for SLP Follow Up: Reduce caregiver burden;Maximize cognitive function and independence   Equipment: none   Reasons for discharge: Discharged from hospital   Patient/Family Agrees with Progress Made and Goals Achieved: Yes    Arbutus Leas 06/13/2019, 6:56 AM

## 2019-06-14 DIAGNOSIS — L299 Pruritus, unspecified: Secondary | ICD-10-CM | POA: Insufficient documentation

## 2019-06-14 DIAGNOSIS — D631 Anemia in chronic kidney disease: Secondary | ICD-10-CM | POA: Insufficient documentation

## 2019-06-14 DIAGNOSIS — R570 Cardiogenic shock: Secondary | ICD-10-CM | POA: Insufficient documentation

## 2019-06-14 DIAGNOSIS — N2581 Secondary hyperparathyroidism of renal origin: Secondary | ICD-10-CM | POA: Insufficient documentation

## 2019-06-14 DIAGNOSIS — Z9889 Other specified postprocedural states: Secondary | ICD-10-CM | POA: Insufficient documentation

## 2019-06-14 DIAGNOSIS — R52 Pain, unspecified: Secondary | ICD-10-CM | POA: Insufficient documentation

## 2019-06-14 DIAGNOSIS — D509 Iron deficiency anemia, unspecified: Secondary | ICD-10-CM | POA: Insufficient documentation

## 2019-06-14 DIAGNOSIS — R197 Diarrhea, unspecified: Secondary | ICD-10-CM | POA: Insufficient documentation

## 2019-06-14 LAB — TYPE AND SCREEN
ABO/RH(D): B POS
Antibody Screen: NEGATIVE
Unit division: 0

## 2019-06-14 LAB — BPAM RBC
Blood Product Expiration Date: 202105052359
ISSUE DATE / TIME: 202104280911
Unit Type and Rh: 7300

## 2019-06-14 LAB — GLUCOSE, CAPILLARY: Glucose-Capillary: 90 mg/dL (ref 70–99)

## 2019-06-14 NOTE — Progress Notes (Signed)
Renal Navigator met with patient at HD bedside. Patient sitting in recliner for treatment with no complaints. He appeared to be in very good spirits and happy about his plan for discharge tomorrow. He is well aware of plan for his first OP HD treatment on Saturday and knows that he needs to go to the clinic prior to Saturday to sign papers. He states he and his son plan to go by the clinic tomorrow after discharge to sign. He wants to go to the [employer] on Friday and smiled. He states no questions, concerns or needs.  Alphonzo Cruise, [place] Renal Navigator [phone]

## 2019-06-14 NOTE — Plan of Care (Signed)
  Problem: Consults Goal: RH GENERAL PATIENT EDUCATION Description: See Patient Education module for education specifics. Outcome: Completed/Met Goal: Skin Care Protocol Initiated - if Braden Score 18 or less Description: If consults are not indicated, leave blank or document N/A Outcome: Completed/Met Goal: Nutrition Consult-if indicated Outcome: Completed/Met Goal: Diabetes Guidelines if Diabetic/Glucose > 140 Description: If diabetic or lab glucose is > 140 mg/dl - Initiate Diabetes/Hyperglycemia Guidelines & Document Interventions  Outcome: Completed/Met   Problem: RH BOWEL ELIMINATION Goal: RH STG MANAGE BOWEL WITH ASSISTANCE Description: STG Manage Bowel with min Assistance. Outcome: Completed/Met Goal: RH STG MANAGE BOWEL W/MEDICATION W/ASSISTANCE Description: STG Manage Bowel with Medication with min Assistance. Outcome: Completed/Met   Problem: RH BLADDER ELIMINATION Goal: RH STG MANAGE BLADDER WITH ASSISTANCE Description: STG Manage Bladder With min/mod Assistance Outcome: Completed/Met   Problem: RH SKIN INTEGRITY Goal: RH STG SKIN FREE OF INFECTION/BREAKDOWN Description: Patients skin will remain free from FURTHER breakdown or infection with min/mod assist. Outcome: Completed/Met Goal: RH STG MAINTAIN SKIN INTEGRITY WITH ASSISTANCE Description: STG Maintain Skin Integrity With min/mod Assistance. Outcome: Completed/Met Goal: RH STG ABLE TO PERFORM INCISION/WOUND CARE W/ASSISTANCE Description: STG Able To Perform Incision/Wound Care With total assistance from caregiver. Outcome: Completed/Met   Problem: RH SAFETY Goal: RH STG ADHERE TO SAFETY PRECAUTIONS W/ASSISTANCE/DEVICE Description: STG Adhere to Safety Precautions With min Assistance/Device. Outcome: Completed/Met   Problem: RH PAIN MANAGEMENT Goal: RH STG PAIN MANAGED AT OR BELOW PT'S PAIN GOAL Description: Maintain pain control of 4 or less Outcome: Completed/Met

## 2019-06-14 NOTE — Progress Notes (Signed)
Patient was discharged from [phone].  Patient left floor via wheelchair escorted by nursing staff.  Patient verbalized understanding of discharge instructions as given by Marlowe Shores, PA.  All patient belongings sent with patient including DME and prescriptions.  Patient appears to be in no immediate distress at this time.  Son verbalized and demonstrated understanding of maintenance of PleurX tube.    Brita Romp, RN

## 2019-06-14 NOTE — Progress Notes (Signed)
[facility] PHYSICAL MEDICINE & REHABILITATION PROGRESS NOTE  Subjective/Complaints: Patient seen sitting up in bed this morning.  He states he slept well overnight.  He states he is ready for discharge.  ROS: Denies CP, SOB, N/V/D  Objective:   No results found. Recent Labs    06/11/19 1350 06/13/19 0823  WBC 6.3 5.2  HGB 7.8* 7.5*  HCT 26.3* 25.2*  PLT 152 147*   Recent Labs    06/11/19 1350 06/13/19 0823  NA 136 135  K 4.6 4.9  CL 99 99  CO2 26 27  GLUCOSE 143* 95  BUN 18 29*  CREATININE 4.41* 5.65*  CALCIUM 7.9* 8.0*    Intake/Output Summary (Last 24 hours) at 06/14/2019 1256 Last data filed at 06/14/2019 0700 Gross per 24 hour  Intake 170 ml  Output --  Net 170 ml     Physical Exam: Vital Signs Blood pressure (!) 118/59, pulse 94, temperature 98.1 F (36.7 C), temperature source Oral, resp. rate 20, height 5\' 8"  (1.727 m), weight 77.2 kg, SpO2 94 %.  Constitutional: No distress . Vital signs reviewed. HENT: Normocephalic.  Atraumatic. Eyes: EOMI. No discharge. Cardiovascular: No JVD. Respiratory: Normal effort.  No stridor.  + Pleurx. GI: Non-distended. Skin: Warm and dry.  Intact. Psych: Normal mood.  Normal behavior. Musc: Improving edema Neurologic: Alert Motor: 4/5 throughout, improving  Assessment/Plan: 1. Functional deficits secondary to debility which require 3+ hours per day of interdisciplinary therapy in a comprehensive inpatient rehab setting.  Physiatrist is providing close team supervision and 24 hour management of active medical problems listed below.  Physiatrist and rehab team continue to assess barriers to discharge/monitor patient progress toward functional and medical goals  Care Tool:  Bathing    Body parts bathed by patient: Right arm, Left arm, Chest, Abdomen, Face, Front perineal area, Right upper leg, Left upper leg   Body parts bathed by helper: Right lower leg, Left lower leg, Buttocks     Bathing assist Assist  Level: Moderate Assistance - Patient 50 - 74%     Upper Body Dressing/Undressing Upper body dressing   What is the patient wearing?: Pull over shirt    Upper body assist Assist Level: Minimal Assistance - Patient > 75%    Lower Body Dressing/Undressing Lower body dressing      What is the patient wearing?: Incontinence brief, Pants     Lower body assist Assist for lower body dressing: Moderate Assistance - Patient 50 - 74%     Toileting Toileting    Toileting assist Assist for toileting: Moderate Assistance - Patient 50 - 74%     Transfers Chair/bed transfer  Transfers assist  Chair/bed transfer activity did not occur: Safety/medical concerns(nausea/dizziness/fatigue)  Chair/bed transfer assist level: Minimal Assistance - Patient > 75% Chair/bed transfer assistive device: Programmer, multimedia   Ambulation assist   Ambulation activity did not occur: Safety/medical concerns(nausea/dizziness/fatigue)  Assist level: Minimal Assistance - Patient > 75% Assistive device: Walker-rolling Max distance: 58'   Walk 10 feet activity   Assist  Walk 10 feet activity did not occur: Safety/medical concerns  Assist level: Minimal Assistance - Patient > 75% Assistive device: Walker-rolling   Walk 50 feet activity   Assist Walk 50 feet with 2 turns activity did not occur: Safety/medical concerns  Assist level: Minimal Assistance - Patient > 75% Assistive device: Walker-rolling    Walk 150 feet activity   Assist Walk 150 feet activity did not occur: Safety/medical concerns  Walk 10 feet on uneven surface  activity   Assist Walk 10 feet on uneven surfaces activity did not occur: Safety/medical concerns   Assist level: Moderate Assistance - Patient - 50 - 74%     Wheelchair     Assist Will patient use wheelchair at discharge?: No Type of Wheelchair: Manual    Wheelchair assist level: Supervision/Verbal cueing Max wheelchair  distance: 50 ft    Wheelchair 50 feet with 2 turns activity    Assist        Assist Level: Supervision/Verbal cueing   Wheelchair 150 feet activity     Assist          Blood pressure (!) 118/59, pulse 94, temperature 98.1 F (36.7 C), temperature source Oral, resp. rate 20, height 5\' 8"  (1.727 m), weight 77.2 kg, SpO2 94 %.  Medical Problem List and Plan: 1.Debilitysecondary to CAD status post CABG 03/15/2019. Impella device removed 04/10/2019. Sternal precautions  DC today  Will see patient for transitional care management in 1-2 weeks post-discharge  Chest x-ray  showing increase effusion-maintain Pleurx as outpatient per CTS. 2. Antithrombotics: -DVT/anticoagulation:SCDs- due to hematuria -antiplatelet therapy: Aspirin 81 mg daily 3. Pain Management:Oxycodone as needed  Controlled on 4/16 4. Mood:Provide emotional support   Remeron started on 4/14 with improvement -antipsychotic agents: N/A 5. Neuropsych: This patientiscapable of making decisions on hisown behalf. 6. Skin/Wound Care:Routine skin checks  Continue PRAFO's for bilateral lower extremities 7. Fluids/Electrolytes/Nutrition:Monitor I/Os 8. Acute systolic congestive heart failure/cardiogenic shock. Follow-up cardiology services Filed Weights   06/13/19 0423 06/13/19 0800 06/14/19 0543  Weight: 80.2 kg 80.2 kg 77.2 kg   Relatively stable on 4/29 9. Acute hypoxic respiratory failure with Pseudomonas pneumonia. Status post tracheostomy 03/27/2019. Decannulated 04/28/2019.  Requiring supplemental oxygen again,?  Appears to require intermittently, monitor in ambulatory setting 10.Acute on chronic anemia/thrombocytopenia. No more heparin for HD for now  Hemoglobin 7.5 on 4/28, labs with HD  Platelets 147 on 4/28, labs with HD  HIT negative 11. Dysphagia/nausea.   Advance to regular thins.  Follow-up speech therapy.Nasogastric tube/cortrakfor nutritional  support.   Dietary follow-up- see dietary note dated 4/6 for additional information.   -PEG not safe option as per cardiothoracic surgery team, will consider follow-up with CTS if necessary.   4/9- Added Phenergan 12.5 mg Q6 hours prn for nausea since zofran doesn't workAdded scopolamine patch for nausea and dizziness.   KUB personally reviewed, bowel gas pattern.    Encourage patient to have son bring in food that patient enjoys and have it modified, reminded again  ECG reviewed, borderline QTC-trial low-dose Reglan with meals on 4/14, with improvement, DC'd on 4/19  Remeron started on 4/14  Overall improved intake 12. 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 Patient has been transitioned to dialysis and being sent to OP HD after CIR, so is chronicand followed by renal services. Tunneled L IJcatheter placed 04/24/2019, exchanged 13. Hematuria. Cystoscopy completed with evacuation of clot. No further bleeding episodes per notes, intermittent bleeding.   Continue to monitor 14. Left pleural effusion. Status post Pleurx catheter 04/23/2019.PATIENT WILL KEEP PLEURX CATHETER FOR NOW PER CVTS. 15. Diffuse drug rashversus contact dermatitis: Resolved  Steroid taper completed   Benadryl prn, Sarna lotion, hypoallergenic sheets.  16.Rectal bleeding. 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 Follow-up GI with conservative care no further bleeding reported.   See #10 17. PAF. Currently off amiodarone. No plan to resume any anticoagulation at this time until hematuria fully resolved.  Vitals:   06/13/19 1941 06/14/19 0543  BP: (!) 114/48 (!) 118/59  Pulse: 85 94  Resp: 18 20  Temp: 98.3 F (36.8 C) 98.1 F (36.7 C)  SpO2: 95% 94%    Rate controlled on 4/29 18.  Prediabetes  Monitor with increased mobility   Controlled on 4/29 19.  Leukocytosis: Resolved  Afebrile 20.  Severe hypoalbuminemia  Supplement initiated, stressed importance  again  > 30 minutes spent in total in discharge planning between myself and PA regarding aforementioned, as well discussion regarding DME equipment, follow-up appointments, discharge medications, discharge recommendations  LOS: 23 days A FACE TO [place] 06/14/2019, 12:56 PM

## 2019-06-15 ENCOUNTER — Telehealth: Payer: Self-pay | Admitting: Physical Medicine & Rehabilitation

## 2019-06-15 ENCOUNTER — Telehealth: Payer: Self-pay | Admitting: Physician Assistant

## 2019-06-15 NOTE — Telephone Encounter (Signed)
Transition of care contact from inpatient facility  Date of discharge: 06/14/19 Date of contact: 06/15/19 Method: Phone Spoke to: Patient and his son Donnie  Patient contacted to discuss transition of care from recent inpatient hospitalization. Patient was admitted to [facility] 03/13/19, transferred to [facility] for care, then eventually CIR at [facility] 05/22/19-06/14/19 for rehab with discharge diagnosis of debility, pressure ulcer, a. Fib, RVR, CHF, CAD s/p CABG x3, hypoxia requiring intubation then tracheostomy, rectal bleeding, and ESRD requiring new start to dialysis. Patient med list and discharge summary were reviewed. Son reports they have equipment they need at home (walker, wheelchair, commode) and follow up appointments are scheduled. Provided address and phone number for Dr. Serita Grit office. Patient's son is working on Event organiser for supervision. They are aware of HD appointment tomorrow and our providers will follow up with pt at the HD unit weekly. All questions answered.  Medication changes were reviewed.  Patient will follow up with his/her outpatient HD unit on: 06/16/19  Anice Paganini, PA-C 06/15/2019, 3:50 PM  [facility] Pager: [phone]

## 2019-06-15 NOTE — Telephone Encounter (Signed)
The patient was to have supervision 24/7.  Has that changed?  He needs supervision, but does not require 24/7 care per se. Thanks.

## 2019-06-15 NOTE — Telephone Encounter (Signed)
Spoke to son Letitia Libra and advised him to find a home health agency that Medicare will cover. I gave him a few suggestions. Asked him to call and let us know where and we will send an order if need to.

## 2019-06-15 NOTE — Telephone Encounter (Signed)
I spoke with Coralyn Mark about making an hospital follow and she had questions about patient getting around the clock care.  I didn't see this in the d/c summary and she is needing a letter stating patient needs that care so she can send to [place].  They also would need an organization that is recommended by the doctor.  This was relayed to son Marcellis Frampton [phone]).  Please call Donnie with any questions.

## 2019-06-18 DIAGNOSIS — N179 Acute kidney failure, unspecified: Secondary | ICD-10-CM | POA: Insufficient documentation

## 2019-06-19 DIAGNOSIS — D689 Coagulation defect, unspecified: Secondary | ICD-10-CM | POA: Insufficient documentation

## 2019-06-22 ENCOUNTER — Other Ambulatory Visit (HOSPITAL_COMMUNITY)
Admission: AD | Admit: 2019-06-22 | Discharge: 2019-06-22 | Disposition: A | Discharge status: Home / Self Care | Payer: Medicare Other | Source: Skilled Nursing Facility | Attending: Family Medicine | Admitting: Family Medicine

## 2019-06-22 DIAGNOSIS — N390 Urinary tract infection, site not specified: Secondary | ICD-10-CM | POA: Diagnosis present

## 2019-06-22 DIAGNOSIS — E46 Unspecified protein-calorie malnutrition: Secondary | ICD-10-CM | POA: Insufficient documentation

## 2019-06-22 LAB — URINALYSIS, MICROSCOPIC (REFLEX): WBC, UA: >50 WBC/hpf (ref 0–5)

## 2019-06-22 LAB — URINALYSIS, ROUTINE W REFLEX MICROSCOPIC
Glucose, UA: 100 mg/dL — AB
Nitrite: POSITIVE — AB
Protein, ur: >300 mg/dL — AB
Specific Gravity, Urine: 1.02 (ref 1.005–1.030)
pH: 6.5 (ref 5.0–8.0)

## 2019-06-24 LAB — URINE CULTURE: Culture: <10000 — AB

## 2019-06-26 ENCOUNTER — Other Ambulatory Visit: Payer: Self-pay | Admitting: Cardiothoracic Surgery

## 2019-06-26 DIAGNOSIS — Z951 Presence of aortocoronary bypass graft: Secondary | ICD-10-CM

## 2019-06-27 ENCOUNTER — Encounter: Payer: Self-pay | Admitting: Cardiothoracic Surgery

## 2019-06-27 ENCOUNTER — Other Ambulatory Visit: Payer: Self-pay

## 2019-06-27 ENCOUNTER — Ambulatory Visit (INDEPENDENT_AMBULATORY_CARE_PROVIDER_SITE_OTHER): Payer: Medicare Other | Admitting: Cardiothoracic Surgery

## 2019-06-27 ENCOUNTER — Ambulatory Visit
Admission: RE | Admit: 2019-06-27 | Discharge: 2019-06-27 | Disposition: A | Discharge status: Home / Self Care | Payer: Medicare Other | Source: Ambulatory Visit | Attending: Cardiothoracic Surgery | Admitting: Cardiothoracic Surgery

## 2019-06-27 ENCOUNTER — Other Ambulatory Visit: Payer: Self-pay | Admitting: *Deleted

## 2019-06-27 VITALS — HR 83 | Temp 97.6°F | Resp 20 | Ht 68.0 in | Wt 164.8 lb

## 2019-06-27 DIAGNOSIS — Z9889 Other specified postprocedural states: Secondary | ICD-10-CM

## 2019-06-27 DIAGNOSIS — Z8679 Personal history of other diseases of the circulatory system: Secondary | ICD-10-CM

## 2019-06-27 DIAGNOSIS — J90 Pleural effusion, not elsewhere classified: Secondary | ICD-10-CM

## 2019-06-27 DIAGNOSIS — Z951 Presence of aortocoronary bypass graft: Secondary | ICD-10-CM

## 2019-06-27 DIAGNOSIS — Z09 Encounter for follow-up examination after completed treatment for conditions other than malignant neoplasm: Secondary | ICD-10-CM

## 2019-06-27 NOTE — Progress Notes (Signed)
PCP is Leeanne Rio, MD Referring Provider is Herminio Commons, MD  Chief Complaint  Patient presents with  . Routine Post Op    f/u from surgery with CXR s/p CABG x3/MAZE, PleurX placement  . Pleural Effusion    drained 2 drops in office today    HPI: Post hospital and postop follow-up after urgent surgical coronary revascularization for ischemic cardiomyopathy Postoperative renal failure requiring dialysis Postoperative recurrent left pleural effusion requiring left Pleurx catheter  Patient presents with his son for review.  He is in a wheelchair.  He is ambulating at home but prefers wheelchair when he leaves the home He is on a Tuesday Thursday Saturday dialysis schedule.  He still makes urine. He has no symptoms of CHF or angina.  Surgical incisions are well-healed. Left Pleurx catheter has been trace for the past 2 weeks and chest x-ray today shows no significant left pleural effusion. We will arrange for removal of left Pleurx catheter in 5 days.  Catheter site is clean and dry.   Past Medical History:  Diagnosis Date  . Atrial fibrillation ([place])   . CAD (coronary artery disease)    a. s/p PCI in 1992 b. Coronary CT in 01/2018 showing extensive coronary calcification; FFR indeterminate --> medical management pursued at that time as patient not interested in repeat cath  . [facility])    prostate  . Dyspnea   . Hypertension     Past Surgical History:  Procedure Laterality Date  . CHEST TUBE INSERTION Left 04/23/2019   Procedure: INSERTION PLEURAL DRAINAGE CATHETER TO DRAIN LEFT PLEURAL EFFUSION;  Surgeon: Ivin Poot, MD;  Location: [facility];  Service: Thoracic;  Laterality: Left;  . CLIPPING OF ATRIAL APPENDAGE N/A 03/16/2019   Procedure: CLIPPING OF LEFT  ATRIAL APPENDAGE - USING ATRICLIP SIZE 40;  Surgeon: Ivin Poot, MD;  Location: [place];  Service: Open Heart Surgery;  Laterality: N/A;  . COLONOSCOPY N/A 08/25/2017   Procedure: COLONOSCOPY;  Surgeon:  Rogene Houston, MD;  [facility];  Service: Endoscopy;  Laterality: N/A;  . CORONARY ARTERY BYPASS GRAFT N/A 03/16/2019   Procedure: CORONARY ARTERY BYPASS GRAFTING (CABG), ON PUMP, TIMES THREE, USING LEFT INTERNAL MAMMARY ARTERY, RIGHT GREATER SAPHENOUS VEIN HARVESTED ENDOSCOPICALLY;  Surgeon: Ivin Poot, MD;  Location: [place];  Service: Open Heart Surgery;  Laterality: N/A;  swan only  . CYSTOSCOPY WITH FULGERATION N/A 04/30/2019   Procedure: CYSTOSCOPY WITH FULGERATION;  Surgeon: Irine Seal, MD;  Location: [place];  Service: Urology;  Laterality: N/A;  . HERNIA REPAIR    . IR FLUORO GUIDE CV LINE LEFT  04/23/2019  . IR FLUORO GUIDE CV LINE RIGHT  05/24/2019  . IR US GUIDE VASC ACCESS LEFT  04/23/2019  . MAZE N/A 03/16/2019   Procedure: MAZE;  Surgeon: Ivin Poot, MD;  Location: [place];  Service: Open Heart Surgery;  Laterality: N/A;  . PLACEMENT OF IMPELLA LEFT VENTRICULAR ASSIST DEVICE Right 03/27/2019   Procedure: Placement Of Impella Left Ventricular Assist Device using ABIOMED Impella 5.5 with SmartAssist Device.;  Surgeon: Wonda Olds, MD;  [facility];  Service: Thoracic;  Laterality: Right;  . PROSTATE SURGERY    . REMOVAL OF IMPELLA LEFT VENTRICULAR ASSIST DEVICE N/A 04/10/2019   Procedure: REMOVAL OF IMPELLA LEFT VENTRICULAR ASSIST DEVICE WITH INSERTION OF RIGHT FEMORAL ARTERIAL LINE;  Surgeon: Ivin Poot, MD;  Location: [place];  Service: Open Heart Surgery;  Laterality: N/A;  . RIGHT/LEFT  HEART CATH AND CORONARY ANGIOGRAPHY N/A 03/15/2019   Procedure: RIGHT/LEFT HEART CATH AND CORONARY ANGIOGRAPHY;  Surgeon: Burnell Blanks, MD;  Location: [place] CV LAB;  Service: Cardiovascular;  Laterality: N/A;  . TEE WITHOUT CARDIOVERSION N/A 03/16/2019   Procedure: TRANSESOPHAGEAL ECHOCARDIOGRAM (TEE);  Surgeon: Prescott Gum, Collier Salina, MD;  Location: [place];  Service: Open Heart Surgery;  Laterality: N/A;  . TRACHEOSTOMY TUBE PLACEMENT N/A 03/27/2019   Procedure:  TRACHEOSTOMY placed using Shiley 8DCT Cuffed.;  Surgeon: Wonda Olds, MD;  [facility];  Service: Thoracic;  Laterality: N/A;  . TRANSURETHRAL RESECTION OF BLADDER NECK N/A 05/13/2019   Procedure: CYSTOSCOPY CLOT EVACUATION FULGRATION CYSTOGRAM AND INSTILLATION OF FORMALIN;  Surgeon: Lucas Mallow, MD;  Location: [place];  Service: Urology;  Laterality: N/A;    Family History  Problem Relation Age of Onset  . CAD Mother 35  . CAD Father 57  . CAD Sister   . CAD Brother     Social History Social History   Tobacco Use  . Smoking status: Former Smoker    Packs/day: 0.25    Years: 50.00    Pack years: 12.50    Types: Cigarettes    Quit date: 02/15/1990    Years since quitting: 29.3  . Smokeless tobacco: Never Used  Substance Use Topics  . Alcohol use: No  . Drug use: No    Current Outpatient Medications  Medication Sig Dispense Refill  . acetaminophen (TYLENOL) 160 MG/5ML solution Take 10.2 mLs (325 mg total) by mouth every 4 (four) hours as needed for moderate pain. 120 mL 0  . anticoagulant sodium citrate 4 GM/100ML SOLN 5 mLs by Intracatheter route every Monday, Wednesday, and Friday with hemodialysis.    Marland Kitchen aspirin 81 MG chewable tablet Chew 1 tablet (81 mg total) by mouth daily.    Marland Kitchen atorvastatin (LIPITOR) 10 MG tablet Take 1 tablet (10 mg total) by mouth daily at 6 PM. 30 tablet 11  . Darbepoetin Alfa (ARANESP) 150 MCG/0.3ML SOSY injection Inject 0.3 mLs (150 mcg total) into the vein every Wednesday with hemodialysis. 1.68 mL   . famotidine (PEPCID) 10 MG tablet Take 1 tablet (10 mg total) by mouth daily. 30 tablet 0  . ferric gluconate 125 mg in sodium chloride 0.9 % 100 mL Inject 125 mg into the vein every Monday, Wednesday, and Friday with hemodialysis.    Marland Kitchen Hydrocortisone (GERHARDT'S BUTT CREAM) CREA Apply 1 application topically 2 (two) times daily. 1 each 0  . hydrocortisone cream 1 % Apply topically 2 (two) times daily. 30 g 0  . multivitamin (RENA-VIT) TABS  tablet Take 1 tablet by mouth at bedtime. 30 tablet 0  . oxyCODONE (OXY IR/ROXICODONE) 5 MG immediate release tablet Take 1 tablet (5 mg total) by mouth every 4 (four) hours as needed for moderate pain. 20 tablet 0  . scopolamine (TRANSDERM-SCOP) 1 MG/3DAYS Place 1 patch (1.5 mg total) onto the skin every 3 (three) days. 10 patch 12   No current facility-administered medications for this visit.    Allergies  Allergen Reactions  . Heparin Other (See Comments)    Thrombocytopenia/ HIT  . Atorvastatin Rash    Review of Systems  Improved overall strength Weight stable No Covid vaccine but no symptoms of Covid  Some dark material draining from bladder with urine after treatment with instillation of formalin for hemorrhagic cystitis  Pulse 83   Temp 97.6 F (36.4 C) (Skin)   Resp 20   Ht 5'  8" (1.727 m)   Wt 164 lb 12.8 oz (74.8 kg)   SpO2 93% Comment: RA  BMI 25.06 kg/m  Physical Exam      Exam    General- alert and comfortable, sitting in wheelchair    Neck- no JVD, no cervical adenopathy palpable, no carotid bruit   Lungs- clear without rales, wheezes   Cor- regular rate and rhythm, no murmur , gallop   Abdomen- soft, non-tender   Extremities - warm, non-tender, minimal edema   Neuro- oriented, appropriate, no focal weakness   Diagnostic Tests: Chest x-ray image personally reviewed showing left Pleurx catheter with minimal left pleural effusion  Impression: Resolution left pleural effusion-patient will have Pleurx catheter removed at [facility] on May 17 Ischemic cardiomyopathy with improved LV function after CABG Maintaining sinus rhythm Postoperative renal failure requiring dialysis-tunneled dialysis catheter placed in hospital  Plan: Patient will need follow-up with his urologist, Dr. Roni Bread to review the situation with bloody mucoid drainage Patient will have Pleurx catheter removed next week, then return to office for chest x-ray and suture removal 10 days  later Len Childs, MD [facility] [phone]

## 2019-06-28 ENCOUNTER — Inpatient Hospital Stay (HOSPITAL_COMMUNITY): Payer: Medicare Other | Attending: Hematology

## 2019-07-02 ENCOUNTER — Encounter (HOSPITAL_COMMUNITY)
Admission: RE | Disposition: A | Discharge status: Home / Self Care | Payer: Self-pay | Source: Home / Self Care | Attending: Cardiothoracic Surgery

## 2019-07-02 ENCOUNTER — Ambulatory Visit (HOSPITAL_COMMUNITY)
Admission: RE | Admit: 2019-07-02 | Discharge: 2019-07-02 | Disposition: A | Discharge status: Home / Self Care | Payer: Medicare Other | Attending: Cardiothoracic Surgery | Admitting: Cardiothoracic Surgery

## 2019-07-02 ENCOUNTER — Encounter (HOSPITAL_COMMUNITY): Payer: Self-pay | Admitting: Cardiothoracic Surgery

## 2019-07-02 DIAGNOSIS — J90 Pleural effusion, not elsewhere classified: Secondary | ICD-10-CM | POA: Insufficient documentation

## 2019-07-02 HISTORY — PX: REMOVAL OF PLEURAL DRAINAGE CATHETER: SHX5080

## 2019-07-02 SURGERY — REMOVAL, CLOSED DRAINAGE CATHETER SYSTEM, PLEURAL
Anesthesia: Monitor Anesthesia Care | Laterality: Left

## 2019-07-02 NOTE — Progress Notes (Signed)
Pt here for minor procedure. Procedure tolerated well. VS WNL pre and post procedure. Pt discharged home with son.

## 2019-07-02 NOTE — Procedures (Addendum)
Date 07/02/2019  Diagnosis: Recurrent pleural effusion Postoperative diagnosis: Same  Procedure: Removal of Pleurx catheter  Description of procedure: After timeout was performed, the skin surrounding the Pleurx exit site on the left lateral chest was prepped with Betadine then infiltrated with 2% lidocaine local anesthesia.  The catheter was retracted gently and a hemostat was used to dissect the soft tissues away from the catheter as far as could be reached.  With gentle traction, it was evident that the catheter was tethered 6 to 7 cm more proximally from the exit site.  This area was then prepped with Betadine and infiltrated with 2% lidocaine local anesthesia.  A 2 cm incision was then made over the catheter.  Using a hemostat, the dense connective tissue was separated from the catheter cuff at this site and the catheter was easily removed.  This incision was closed with 3-0 silk loosely.  A loose figure-of-eight suture was also placed at the exit site to control some bleeding from the dissection. The field was then cleaned with a moist cloth.  The sites were both hemostatic at this point.  Sterile dressings were applied. Jesse Murphy tolerated this well.  He is to follow-up with Dr. Darcey Nora in the office on Wednesday, 07/11/2019 at 12 noon for suture removal and chest x-ray.  Jesse Cutter, PA-C  patient examined and medical record reviewed,agree with above note. Tharon Aquas Trigt III 07/03/2019

## 2019-07-02 NOTE — Discharge Instructions (Signed)
May remove the dressings in 24 hours and shower of the incisions.  After this you may redress if any drainage occurs, otherwise leave open to air.

## 2019-07-03 ENCOUNTER — Telehealth: Payer: Self-pay | Admitting: Physician Assistant

## 2019-07-03 NOTE — Telephone Encounter (Signed)
  Patient Consent for Virtual Visit         Jesse Murphy has provided verbal consent on 07/03/2019 for a virtual visit (video or telephone).   CONSENT FOR VIRTUAL VISIT FOR:  Jesse Murphy  By participating in this virtual visit I agree to the following:  I hereby voluntarily request, consent and authorize [facility] and its employed or contracted physicians, physician assistants, nurse practitioners or other licensed health care professionals (the Practitioner), to provide me with telemedicine health care services (the "Services") as deemed necessary by the treating Practitioner. I acknowledge and consent to receive the Services by the Practitioner via telemedicine. I understand that the telemedicine visit will involve communicating with the Practitioner through live audiovisual communication technology and the disclosure of certain medical information by electronic transmission. I acknowledge that I have been given the opportunity to request an in-person assessment or other available alternative prior to the telemedicine visit and am voluntarily participating in the telemedicine visit.  I understand that I have the right to withhold or withdraw my consent to the use of telemedicine in the course of my care at any time, without affecting my right to future care or treatment, and that the Practitioner or I may terminate the telemedicine visit at any time. I understand that I have the right to inspect all information obtained and/or recorded in the course of the telemedicine visit and may receive copies of available information for a reasonable fee.  I understand that some of the potential risks of receiving the Services via telemedicine include:  Marland Kitchen Delay or interruption in medical evaluation due to technological equipment failure or disruption; . Information transmitted may not be sufficient (e.g. poor resolution of images) to allow for appropriate medical decision making by the Practitioner;  and/or  . In rare instances, security protocols could fail, causing a breach of personal health information.  Furthermore, I acknowledge that it is my responsibility to provide information about my medical history, conditions and care that is complete and accurate to the best of my ability. I acknowledge that Practitioner's advice, recommendations, and/or decision may be based on factors not within their control, such as incomplete or inaccurate data provided by me or distortions of diagnostic images or specimens that may result from electronic transmissions. I understand that the practice of medicine is not an exact science and that Practitioner makes no warranties or guarantees regarding treatment outcomes. I acknowledge that a copy of this consent can be made available to me via my patient portal ([facility]), or I can request a printed copy by calling the office of [facility].    I understand that my insurance will be billed for this visit.   I have read or had this consent read to me. . I understand the contents of this consent, which adequately explains the benefits and risks of the Services being provided via telemedicine.  . I have been provided ample opportunity to ask questions regarding this consent and the Services and have had my questions answered to my satisfaction. . I give my informed consent for the services to be provided through the use of telemedicine in my medical care

## 2019-07-05 ENCOUNTER — Ambulatory Visit (HOSPITAL_COMMUNITY): Payer: Medicare Other | Admitting: Hematology

## 2019-07-06 ENCOUNTER — Other Ambulatory Visit (HOSPITAL_COMMUNITY)
Admission: RE | Admit: 2019-07-06 | Discharge: 2019-07-06 | Disposition: A | Discharge status: Home / Self Care | Payer: Medicare Other | Source: Ambulatory Visit | Attending: Physician Assistant | Admitting: Physician Assistant

## 2019-07-06 ENCOUNTER — Encounter: Payer: Self-pay | Admitting: Physician Assistant

## 2019-07-06 ENCOUNTER — Ambulatory Visit (INDEPENDENT_AMBULATORY_CARE_PROVIDER_SITE_OTHER): Payer: Medicare Other | Admitting: Physician Assistant

## 2019-07-06 ENCOUNTER — Other Ambulatory Visit: Payer: Self-pay

## 2019-07-06 VITALS — BP 110/54 | HR 88 | Ht 68.0 in | Wt 131.0 lb

## 2019-07-06 DIAGNOSIS — I480 Paroxysmal atrial fibrillation: Secondary | ICD-10-CM

## 2019-07-06 DIAGNOSIS — I5042 Chronic combined systolic (congestive) and diastolic (congestive) heart failure: Secondary | ICD-10-CM | POA: Diagnosis not present

## 2019-07-06 DIAGNOSIS — R21 Rash and other nonspecific skin eruption: Secondary | ICD-10-CM

## 2019-07-06 DIAGNOSIS — D649 Anemia, unspecified: Secondary | ICD-10-CM | POA: Insufficient documentation

## 2019-07-06 DIAGNOSIS — I2510 Atherosclerotic heart disease of native coronary artery without angina pectoris: Secondary | ICD-10-CM | POA: Diagnosis not present

## 2019-07-06 LAB — CBC
HCT: 32.5 % — ABNORMAL LOW (ref 39.0–52.0)
Hemoglobin: 9.5 g/dL — ABNORMAL LOW (ref 13.0–17.0)
MCH: 29.9 pg (ref 26.0–34.0)
MCHC: 29.2 g/dL — ABNORMAL LOW (ref 30.0–36.0)
MCV: 102.2 fL — ABNORMAL HIGH (ref 80.0–100.0)
Platelets: 141 10*3/uL — ABNORMAL LOW (ref 150–400)
RBC: 3.18 MIL/uL — ABNORMAL LOW (ref 4.22–5.81)
RDW: 19.1 % — ABNORMAL HIGH (ref 11.5–15.5)
WBC: 6.3 10*3/uL (ref 4.0–10.5)
nRBC: 0 % (ref 0.0–0.2)

## 2019-07-06 NOTE — Progress Notes (Signed)
Cardiology Office Note    Date:  07/06/2019   ID:  Jesse Jesse Murphy, DOB 03-01-1928, MRN [id number]  PCP:  Jesse Rio, MD  Cardiologist:  Jesse Sable, MD  Electrophysiologist:  None   Chief Complaint: f/u bypass and 3 month hospitalization  History of Present Illness:   Jesse Jesse Murphy is a 86 y.o. male with history of CAD s/p PCI 1992 with recent CABG with very complex prolonged admission, PAF, chronic combined CHF/ICM, anemia, recent progression to ESRD, left pleural effusion s/p PleurX catheter, follicular lymphoma, prostate CA who presents for post-hospital follow-up.  He has prior history of remote PCI and previous diastolic CHF. He had extensive coronary disease by CT in 2019 but patient was not interested in cath at that time. He was recently in the hospital for prolonged, complex admission from 1/26-05/22/19. He had presented with chest pain, arm pain, and palpitations and was found to be in atrial fibrillation with NSTEMI. 2D Echo showed LV dysfunction EF 30-35%. He was transferred to [facility] for cardiac cath which demonstrated severe multivessel disease. He underwent CABGx3 on 03/17/19 with LIMA to LAD; SVG to OM1; SVG to PDA as well as MAZE and LAA clipping. Hospitalization was notable for cardiogenic shock requiring impella support post-CABG, rash (unclear if statin or amiodarone), acute hypoxic respiratory failure with pseudomonas pneumonia requiring trache 03/27/19 (decannulated 3/13), AKI progressing to ESRD due to shock/ATN, hematuria s/p cystoscopy with fulgeration and formalin installation, CHB with profound symptomatic bradycardia s/p emergent TVP (suspected vagal in nature), severe debility, large left pleural effusion s/p pleurX (managed by [facility]), acute on chronic-appearing anemia, dysphagia, BRBPR (possible esophagitis +/- radiation proctitis, treated with fluconazole), hyperkalemia, failure to thrive, thrombocytopenia, hypoalbuminemia. Regarding rash, he initially had  diffuse rash on extremities and torso w/ appearance of drug induced rash. Steroids were started with improvement. He redeveloped diffuse rash 4/6 after addition of Reglan and atorvastatin. Both agents were discontinued. Regarding his bradycardia this was felt vagally mediated in context of bladder irrigation. He was not felt to require PPM. Last 2D Echo 03/20/19 showed EF 25-30% with f/u intra-op TEE with impella in Jesse Murphy showed EF 50-55%.  He went to CIR. He was originally on my schedule as a telephone virtual visit but I requested we change to in-office visit. Last labs personally reviewed 05/2019 with Hgb 7.5, K 4.6, Cr 4.41, albumin 2.1, 02/2019 LDL 74, TSH wnl. He had a UA about 2 weeks ago that still showed moderate Hgb and positive nitrite but insignificant growth on culture. Son indicates this has since been managed by primary care. They indicate Dr. Prescott Murphy planned to try and get him a f/u with Dr. Jeffie Jesse Murphy with urology but they had not heard anything back yet.  Jesse Jesse Murphy presents back with his son Jesse Jesse Murphy today. He reports he's actually doing very well, denies any CP, SOB. Has mild lower extremity edema on exam. Jesse Jesse Murphy is coming out for physical therapy but they are having trouble coordinating schedules due to conflicting doctor's appointments. He had his PleurX catheter removed by Dr. Prescott Murphy on 5/17. He has follow-up with him again on 5/26. Upon examination he has a mild scaly pink rash around his surgical tape that he himself did not know was there - it is not bothersome. He has an appt on Monday with Dr. Posey Pronto with rehab medicine.     Past Medical History:  Diagnosis Date  . Anemia   . CAD (coronary artery disease)    a. s/p  PCI in 1992 b. Coronary CT in 01/2018 showing extensive coronary calcification; FFR indeterminate --> medical management pursued at that time as patient not interested in repeat cath. c. s/p CABGx3 in 02/2019.  . Cancer [facility])    prostate  . Cardiogenic shock ([place])   . CHB  (complete heart block) (HCC)    a. due to vagal event in 2021, requiring temp pacer.  . Chronic combined systolic and diastolic CHF (congestive heart failure) ([place])   . Debilitated   . Dyspnea   . End stage renal disease ([place])   . Esophagitis   . H/O tracheostomy    a. during admit in 2021 for respiratory failure.  . Hematuria   . Hypertension   . Hypoalbuminemia   . Ischemic cardiomyopathy   . PAF (paroxysmal atrial fibrillation) ([place])   . Pleural effusion, left    a. s/p pleurX catheter  . Pseudomonas pneumonia ([place])   . Thrombocytopenia ([place])     Past Surgical History:  Procedure Laterality Date  . CHEST TUBE INSERTION Left 04/23/2019   Procedure: INSERTION PLEURAL DRAINAGE CATHETER TO DRAIN LEFT PLEURAL EFFUSION;  Surgeon: Ivin Poot, MD;  Location: [facility];  Service: Thoracic;  Laterality: Left;  . CLIPPING OF ATRIAL APPENDAGE N/A 03/16/2019   Procedure: CLIPPING OF LEFT  ATRIAL APPENDAGE - USING ATRICLIP SIZE 40;  Surgeon: Ivin Poot, MD;  Location: [facility];  Service: Open Heart Surgery;  Laterality: N/A;  . COLONOSCOPY N/A 08/25/2017   Procedure: COLONOSCOPY;  Surgeon: Rogene Houston, MD;  [facility];  Service: Endoscopy;  Laterality: N/A;  . CORONARY ARTERY BYPASS GRAFT N/A 03/16/2019   Procedure: CORONARY ARTERY BYPASS GRAFTING (CABG), ON PUMP, TIMES THREE, USING LEFT INTERNAL MAMMARY ARTERY, RIGHT GREATER SAPHENOUS VEIN HARVESTED ENDOSCOPICALLY;  Surgeon: Ivin Poot, MD;  Location: [facility];  Service: Open Heart Surgery;  Laterality: N/A;  swan only  . CYSTOSCOPY WITH FULGERATION N/A 04/30/2019   Procedure: CYSTOSCOPY WITH FULGERATION;  Surgeon: Irine Seal, MD;  Location: [facility];  Service: Urology;  Laterality: N/A;  . HERNIA REPAIR    . IR FLUORO GUIDE CV LINE LEFT  04/23/2019  . IR FLUORO GUIDE CV LINE RIGHT  05/24/2019  . IR US GUIDE VASC ACCESS LEFT  04/23/2019  . MAZE N/A 03/16/2019   Procedure: MAZE;  Surgeon: Ivin Poot, MD;  Location: [facility];   Service: Open Heart Surgery;  Laterality: N/A;  . PLACEMENT OF IMPELLA LEFT VENTRICULAR ASSIST DEVICE Right 03/27/2019   Procedure: Placement Of Impella Left Ventricular Assist Device using ABIOMED Impella 5.5 with SmartAssist Device.;  Surgeon: Wonda Olds, MD;  Location: [facility];  Service: Thoracic;  Laterality: Right;  . PROSTATE SURGERY    . REMOVAL OF IMPELLA LEFT VENTRICULAR ASSIST DEVICE N/A 04/10/2019   Procedure: REMOVAL OF IMPELLA LEFT VENTRICULAR ASSIST DEVICE WITH INSERTION OF RIGHT FEMORAL ARTERIAL LINE;  Surgeon: Ivin Poot, MD;  Location: [facility];  Service: Open Heart Surgery;  Laterality: N/A;  . REMOVAL OF PLEURAL DRAINAGE CATHETER Left 07/02/2019   Procedure: REMOVAL OF PLEURAL DRAINAGE CATHETER;  Surgeon: Ivin Poot, MD;  Location: [facility];  Service: Thoracic;  Laterality: Left;  . RIGHT/LEFT HEART CATH AND CORONARY ANGIOGRAPHY N/A 03/15/2019   Procedure: RIGHT/LEFT HEART CATH AND CORONARY ANGIOGRAPHY;  Surgeon: Burnell Blanks, MD;  Location: [facility];  Service: Cardiovascular;  Laterality: N/A;  . TEE WITHOUT CARDIOVERSION N/A 03/16/2019   Procedure: TRANSESOPHAGEAL ECHOCARDIOGRAM (TEE);  Surgeon:  Ivin Poot, MD;  Location: [facility];  Service: Open Heart Surgery;  Laterality: N/A;  . TRACHEOSTOMY TUBE PLACEMENT N/A 03/27/2019   Procedure: TRACHEOSTOMY placed using [place] 8DCT Cuffed.;  Surgeon: Wonda Olds, MD;  [facility];  Service: Thoracic;  Laterality: N/A;  . TRANSURETHRAL RESECTION OF BLADDER NECK N/A 05/13/2019   Procedure: CYSTOSCOPY CLOT EVACUATION FULGRATION CYSTOGRAM AND INSTILLATION OF FORMALIN;  Surgeon: Lucas Mallow, MD;  Location: [facility];  Service: Urology;  Laterality: N/A;    Current Medications: Current Meds  Medication Sig  . acetaminophen (TYLENOL) 325 MG tablet Take 650 mg by mouth every 6 (six) hours as needed for mild pain or headache.  . anticoagulant sodium citrate 4 GM/100ML SOLN 5 mLs by Intracatheter route  every Monday, Wednesday, and Friday with hemodialysis.  Marland Kitchen aspirin 81 MG chewable tablet Chew 1 tablet (81 mg total) by mouth daily.  . Darbepoetin Alfa (ARANESP) 150 MCG/0.3ML SOSY injection Inject 0.3 mLs (150 mcg total) into the vein every Wednesday with hemodialysis.  . famotidine (PEPCID) 10 MG tablet Take 1 tablet (10 mg total) by mouth daily.  . ferric gluconate 125 mg in sodium chloride 0.9 % 100 mL Inject 125 mg into the vein every Monday, Wednesday, and Friday with hemodialysis.  Marland Kitchen Hydrocortisone (GERHARDT'S BUTT CREAM) CREA Apply 1 application topically 2 (two) times daily.  . hydrocortisone cream 1 % Apply topically 2 (two) times daily. (Patient taking differently: Apply 1 application topically 2 (two) times daily. )  . multivitamin (RENA-VIT) TABS tablet Take 1 tablet by mouth at bedtime.  Marland Kitchen nystatin cream (MYCOSTATIN) Apply 1 application topically 2 (two) times daily. private area  . oxyCODONE (OXY IR/ROXICODONE) 5 MG immediate release tablet Take 1 tablet (5 mg total) by mouth every 4 (four) hours as needed for moderate pain.  Marland Kitchen scopolamine (TRANSDERM-SCOP) 1 MG/3DAYS Place 1 patch (1.5 mg total) onto the skin every 3 (three) days.  . [DISCONTINUED] acetaminophen (TYLENOL) 160 MG/5ML solution Take 10.2 mLs (325 mg total) by mouth every 4 (four) hours as needed for moderate pain.      Allergies:   Atorvastatin, Amiodarone, Heparin, and Reglan [metoclopramide]   Social History   Socioeconomic History  . Marital status: Married    Spouse name: Not on file  . Number of children: 2  . Years of education: Not on file  . Highest education level: Not on file  Occupational History  . Occupation: Librarian, academic in supply    Comment: [employer]  Tobacco Use  . Smoking status: Former Smoker    Packs/day: 0.25    Years: 50.00    Pack years: 12.50    Types: Cigarettes    Quit date: 02/15/1990    Years since quitting: 29.4  . Smokeless tobacco: Never Used  Substance and Sexual Activity    . Alcohol use: No  . Drug use: No  . Sexual activity: Not Currently  Other Topics Concern  . Not on file  Social History Narrative  . Not on file   Social Determinants of Health   Financial Resource Strain:   . Difficulty of Paying Living Expenses:   Food Insecurity:   . Worried About Charity fundraiser in the Last Year:   . Arboriculturist in the Last Year:   Transportation Needs:   . Film/video editor (Medical):   Marland Kitchen Lack of Transportation (Non-Medical):   Physical Activity:   . Days of Exercise per Week:   . Minutes  of Exercise per Session:   Stress:   . Feeling of Stress :   Social Connections:   . Frequency of Communication with Friends and Family:   . Frequency of Social Gatherings with Friends and Family:   . Attends Religious Services:   . Active Member of Clubs or Organizations:   . Attends Archivist Meetings:   Marland Kitchen Marital Status:      Family History:  The patient's family history includes CAD in his brother and sister; CAD (age of onset: 87) in his mother; CAD (age of onset: 38) in his father.  ROS:   Please see the history of present illness.  All other systems are reviewed and otherwise negative.    EKGs/Labs/Other Studies Reviewed:    Studies reviewed are outlined and summarized above. Reports included below if pertinent.  TEE 03/27/19 POST-OP IMPRESSIONS  - Left Ventricle: The left ventricle is unchanged from pre-bypass.  - Aortic Valve: Impella in Jesse Murphy in good position.   PRE-OP FINDINGS  Left Ventricle: The left ventricle has low normal systolic function, with  an ejection fraction of 50-55%. The cavity size was normal. There is mild  concentric left ventricular hypertrophy.   Right Ventricle: The right ventricle has normal systolic function. The  cavity was normal. There is no increase in right ventricular wall  thickness.   Left Atrium: Left atrial size was normal in size. The left atrial  appendage is well visualized and  there is no evidence of thrombus present.   Right Atrium: Right atrial size was normal in size. Right atrial pressure  is estimated at 10 mmHg.   Interatrial Septum: No atrial level shunt detected by color flow Doppler.   Pericardium: There is no evidence of pericardial effusion.   Mitral Valve: The mitral valve is normal in structure. Mitral valve  regurgitation is mild to moderate by color flow Doppler. The MR jet is  centrally-directed.   Tricuspid Valve: The tricuspid valve was normal in structure. Tricuspid  valve regurgitation is mild by color flow Doppler.   Aortic Valve: The aortic valve is tricuspid Aortic valve regurgitation was  not visualized by color flow Doppler. There is no stenosis of the aortic  valve.   Pulmonic Valve: The pulmonic valve was not assessed.  Pulmonic valve regurgitation is not visualized by color flow Doppler.    Aorta: The aortic root, ascending aorta and aortic arch are normal in size  and structure.     Lillia Abed MD  Electronically signed by Lillia Abed MD  Signature Date/Time: 03/27/2019/5:39:33 PM   2D Echo 03/20/19 1. Left ventricular ejection fraction, by visual estimation, is 25 to  30%. The left ventricle has severely decreased function. There is no  increased left ventricular wall thickness.  2. Abnormal septal motion consistent with left bundle branch block.  3. The left ventricle demonstrates global hypokinesis. Images inadequate  for focal wall motion abnormalities.  4. Left ventricular diastolic parameters are indeterminate due to atrial  fibrillation.  5. Global right ventricle has normal systolc function.The right  ventricular size is normal. no increase in right ventricular wall  thickness.  6. Mild to moderate mitral annular calcification. No evidence of mitral  valve regurgitation. No evidence of mitral stenosis.  7. The tricuspid valve was normal in structure. Tricuspid valve  regurgitation is not demonstrated.    8. There is heavy focal calcification of the right coronary cusp. The  mean AV gradient is 21mmHg which may be underestimated in the setting  of  severe LV dysfunction.  9. The pulmonic valve was normal in structure.  10. The inferior vena cava is normal in size with greater than 50%  respiratory variability, suggesting right atrial pressure of 3 mmHg.  11. Small pericardial effusion.  12. The pericardial effusion is posterior to the left ventricle.   LHC 03/15/19    Dist LM to Ost LAD lesion is 80% stenosed.  Ost LM to Dist LM lesion is 50% stenosed.  Mid Cx lesion is 30% stenosed.  Prox RCA-1 lesion is 60% stenosed.  Prox RCA-2 lesion is 99% stenosed.   1. Severe calcific stenosis in the distal left main, ostial LAD and mid RCA 2. Elevated filling pressures  Recommendations: He has a severe, heavily calcified stenosis in the mid RCA. He also has a heavily calcified distal left main, ostial LAD stenosis. Revascularization with PCI would be high risk and require atherectomy in the ostial LAD extending back into the left main with chance of obstruction of flow into the Circumflex. He is 86 years old and would be high risk for CABG as well but he is overall a very functional patient for his age. I will ask CT surgery to see him to discuss CABG. He will need optimization of his heart rate control and diuresis. I will start Lasix 40 mg IV BID.       EKG:  EKG is ordered today, personally reviewed, demonstrating NSR 79bpm, rightward axis, NSIVCD, TWI inferiorly and V5-V6  Recent Labs: 03/13/2019: TSH 3.683 03/27/2019: B Natriuretic Peptide 800.4 05/22/2019: Magnesium 2.2 06/01/2019: ALT 11 06/13/2019: BUN 29; Creatinine, Ser 5.65; Hemoglobin 7.5; Platelets 147; Potassium 4.9; Sodium 135  Recent Lipid Panel    Component Value Date/Time   CHOL 117 03/16/2019 0444   TRIG 52 03/16/2019 0444   HDL 33 (L) 03/16/2019 0444   CHOLHDL 3.5 03/16/2019 0444   VLDL 10 03/16/2019 0444   LDLCALC  74 03/16/2019 0444    PHYSICAL EXAM:    VS:  BP (!) 110/54   Pulse 88   Ht 5\' 8"  (1.727 m)   Wt 131 lb (59.4 kg)   SpO2 99%   BMI 19.92 kg/m   BMI: Body mass index is 19.92 kg/m.  GEN: Well nourished, well developed frail appearing WM, in no acute distress, in wheelchair HEENT: normocephalic, atraumatic Neck: no JVD, carotid bruits, or masses Cardiac: RRR; no murmurs, rubs, or gallops, no edema  Respiratory:  clear to auscultation bilaterally, normal work of breathing GI: soft, nontender, nondistended, + BS MS: no deformity or atrophy Skin: warm and dry, scaly pink erythematous rash along border of pleurx catheter dressing, nonsuppurative, no blisters or pustules, under dressing appears clear, no other rashes, + senile purpura noted Neuro:  Alert and Oriented x 3, Strength and sensation are intact, follows commands Psych: euthymic mood, full affect  Wt Readings from Last 3 Encounters:  07/06/19 131 lb (59.4 kg)  07/02/19 164 lb (74.4 kg)  06/27/19 164 lb 12.8 oz (74.8 kg)  The patient states his weight is 30lb different than prior - office scale accuracy is currently in question  ASSESSMENT & PLAN:   1. CAD s/p CABG - clinically doing well considering the complex course he endured. Continue ASA as we are able. Regarding NSTEMI, would not place on Plavix due to anemia and hematuria issues. Not on BB due to h/o complete heart block. Not on statin due to rash of unclear etiology. At some point we should consider PCSK9 inhibitors but given  issues with rash, I think this should be deferred until next visit so that we do not confound the issue. He will continue with PT.  2. Chronic combined CHF/ICM - EF was 25-30% in February, then EF 50-55% by TEE with Impella in place. Volume status is now managed by HD. Blood pressure remains on the softer side, prohibiting aggressive medical therapy. Can consider rechecking echocardiogram in several months to assess recovery. 3. Acute on chronic  anemia - recheck CBC today to ensure improvement from last month. 4. Paroxysmal atrial fibrillation - maintaining NSR. Per last [facility] was still having issues with microscopic hematuria. He has not seen any frank blood recently (still makes urine). He has not heard anything from Dr. Ralene Muskrat office yet. He will need to see urology in follow-up for his hematuria before we can consider revisiting issue of anticoagulation. While he is in NSR will follow conservatively for now. Will have our clinic staff call urology office to see if they can get him in to be seen. 5. Rash - I uploaded pictures to Epic under media and spoke with T[facility] office nurse Caryl Pina. She indicates this looks like a reaction to the tape and I agree. He does not have any other areas of rash. It is not specifically under the dressing itself but in the mild periphery. She advised that he may use hydrocortisone to see if this improves and offer the option of going to medical supply to obtain replacement tape to try over the area. I did advise that if rash did not improve with HC over the weekend to give [facility] office a call. He has no other concerning symptoms. He sees Dr. Prescott Murphy in 5 days for recheck.  Disposition: F/u with Dr. Bronson Ing or Bernerd Pho in 1 month (if requiring APP would prefer [place] so that he establishes with regular office APP for continuity).  Medication Adjustments/Labs and Tests Ordered: Current medicines are reviewed at length with the patient today.  Concerns regarding medicines are outlined above. Medication changes, Labs and Tests ordered today are summarized above and listed in the Patient Instructions accessible in Encounters.    Signed, Charlie Pitter, PA-C  07/06/2019 2:18 PM    [facility] Location in [place], [place] [address] Ph: [phone]; Fax [phone]

## 2019-07-06 NOTE — Patient Instructions (Signed)
Medication Instructions: Your physician recommends that you continue on your current medications as directed. Please refer to the Current Medication list given to you today.   Labwork: CBC today  Procedures/Testing: None today  Follow-Up: 1 month office visit with Bernerd Pho, PA-C  Any Additional Special Instructions Will Be Listed Below (If Applicable).     If you need a refill on your cardiac medications before your next appointment, please call your pharmacy.      Thank you for choosing [facility] !

## 2019-07-06 NOTE — Progress Notes (Signed)
Rash photos

## 2019-07-09 ENCOUNTER — Encounter: Payer: Medicare Other | Attending: Physical Medicine & Rehabilitation | Admitting: Physical Medicine & Rehabilitation

## 2019-07-09 ENCOUNTER — Encounter: Payer: Self-pay | Admitting: Physical Medicine & Rehabilitation

## 2019-07-09 ENCOUNTER — Other Ambulatory Visit: Payer: Self-pay

## 2019-07-09 VITALS — BP 94/54 | HR 81 | Temp 98.7°F | Ht 68.0 in | Wt 164.4 lb

## 2019-07-09 DIAGNOSIS — Z992 Dependence on renal dialysis: Secondary | ICD-10-CM | POA: Diagnosis present

## 2019-07-09 DIAGNOSIS — I2510 Atherosclerotic heart disease of native coronary artery without angina pectoris: Secondary | ICD-10-CM

## 2019-07-09 DIAGNOSIS — N179 Acute kidney failure, unspecified: Secondary | ICD-10-CM | POA: Diagnosis present

## 2019-07-09 DIAGNOSIS — N186 End stage renal disease: Secondary | ICD-10-CM | POA: Diagnosis present

## 2019-07-09 DIAGNOSIS — I5021 Acute systolic (congestive) heart failure: Secondary | ICD-10-CM | POA: Diagnosis present

## 2019-07-09 DIAGNOSIS — R319 Hematuria, unspecified: Secondary | ICD-10-CM

## 2019-07-09 DIAGNOSIS — R5381 Other malaise: Secondary | ICD-10-CM | POA: Diagnosis present

## 2019-07-09 NOTE — Progress Notes (Signed)
Subjective:    Patient ID: Jesse Murphy, male    DOB: 1928/11/05, 86 y.o.   MRN: [id number]  HPI  Right-handed male with history of CAD, chronic atrial fibrillation on Xarelto as well as non-Hodgkin's lymphoma, chronic anemia, hypertension, diastolic congestive heart failure, prostate cancer presents for hospital follow-up after receiving CIR for multifactorial debility.  Son provides history. At discharge, he was instructed to follow-up with CTS, which he did.  Notes reviewed-Pleurx catheter removed.  He has not followed up with GI. He is going to schedule with Urology. He has not followed up with Nephro. He saw Cards. He is no longer taking Remeron.  Weights have been stable. He is not requiring supplemental oxygen. Nausea is controlled. Rash has resolved. Denies falls.  Therapies: 1-2/week DME: Bedside commode Mobility: Wheelchair most of the time, walker with assistance   Pain Inventory Average Pain 9 Pain Right Now 6 My pain is intermittent, sharp, burning, tingling and aching  In the last 24 hours, has pain interfered with the following? General activity 6 Relation with others 6 Enjoyment of life 6 What TIME of day is your pain at its worst? morning evening and night Sleep (in general) Good  Pain is worse with: sitting and inactivity Pain improves with: rest, therapy/exercise, pacing activities and medication Relief from Meds: 8  Mobility walk with assistance use a walker how many minutes can you walk? 1 minute do you drive?  no use a wheelchair transfers alone  Function not employed: date last employed . I need assistance with the following:  dressing, bathing, toileting, meal prep, household duties and shopping  Neuro/Psych bladder control problems weakness trouble walking dizziness loss of taste or smell  Prior Studies TC appt  Physicians involved in your care TC appt   Family History  Problem Relation Age of Onset  . CAD Mother 68  . CAD Father  63  . CAD Sister   . CAD Brother    Social History   Socioeconomic History  . Marital status: Married    Spouse name: Not on file  . Number of children: 2  . Years of education: Not on file  . Highest education level: Not on file  Occupational History  . Occupation: Librarian, academic in supply    Comment: [employer]  Tobacco Use  . Smoking status: Former Smoker    Packs/day: 0.25    Years: 50.00    Pack years: 12.50    Types: Cigarettes    Quit date: 02/15/1990    Years since quitting: 29.4  . Smokeless tobacco: Never Used  Substance and Sexual Activity  . Alcohol use: No  . Drug use: No  . Sexual activity: Not Currently  Other Topics Concern  . Not on file  Social History Narrative  . Not on file   Social Determinants of Health   Financial Resource Strain:   . Difficulty of Paying Living Expenses:   Food Insecurity:   . Worried About Charity fundraiser in the Last Year:   . Arboriculturist in the Last Year:   Transportation Needs:   . Film/video editor (Medical):   Marland Kitchen Lack of Transportation (Non-Medical):   Physical Activity:   . Days of Exercise per Week:   . Minutes of Exercise per Session:   Stress:   . Feeling of Stress :   Social Connections:   . Frequency of Communication with Friends and Family:   . Frequency of Social Gatherings with Friends and  Family:   . Attends Religious Services:   . Active Member of Clubs or Organizations:   . Attends Archivist Meetings:   Marland Kitchen Marital Status:    Past Surgical History:  Procedure Laterality Date  . CHEST TUBE INSERTION Left 04/23/2019   Procedure: INSERTION PLEURAL DRAINAGE CATHETER TO DRAIN LEFT PLEURAL EFFUSION;  Surgeon: Ivin Poot, MD;  Location: [place];  Service: Thoracic;  Laterality: Left;  . CLIPPING OF ATRIAL APPENDAGE N/A 03/16/2019   Procedure: CLIPPING OF LEFT  ATRIAL APPENDAGE - USING ATRICLIP SIZE 40;  Surgeon: Ivin Poot, MD;  Location: [place];  Service: Open Heart Surgery;   Laterality: N/A;  . COLONOSCOPY N/A 08/25/2017   Procedure: COLONOSCOPY;  Surgeon: Rogene Houston, MD;  [facility];  Service: Endoscopy;  Laterality: N/A;  . CORONARY ARTERY BYPASS GRAFT N/A 03/16/2019   Procedure: CORONARY ARTERY BYPASS GRAFTING (CABG), ON PUMP, TIMES THREE, USING LEFT INTERNAL MAMMARY ARTERY, RIGHT GREATER SAPHENOUS VEIN HARVESTED ENDOSCOPICALLY;  Surgeon: Ivin Poot, MD;  Location: [place];  Service: Open Heart Surgery;  Laterality: N/A;  swan only  . CYSTOSCOPY WITH FULGERATION N/A 04/30/2019   Procedure: CYSTOSCOPY WITH FULGERATION;  Surgeon: Irine Seal, MD;  Location: [place];  Service: Urology;  Laterality: N/A;  . HERNIA REPAIR    . IR FLUORO GUIDE CV LINE LEFT  04/23/2019  . IR FLUORO GUIDE CV LINE RIGHT  05/24/2019  . IR US GUIDE VASC ACCESS LEFT  04/23/2019  . MAZE N/A 03/16/2019   Procedure: MAZE;  Surgeon: Ivin Poot, MD;  Location: [place];  Service: Open Heart Surgery;  Laterality: N/A;  . PLACEMENT OF IMPELLA LEFT VENTRICULAR ASSIST DEVICE Right 03/27/2019   Procedure: Placement Of Impella Left Ventricular Assist Device using ABIOMED Impella 5.5 with SmartAssist Device.;  Surgeon: Wonda Olds, MD;  [facility];  Service: Thoracic;  Laterality: Right;  . PROSTATE SURGERY    . REMOVAL OF IMPELLA LEFT VENTRICULAR ASSIST DEVICE N/A 04/10/2019   Procedure: REMOVAL OF IMPELLA LEFT VENTRICULAR ASSIST DEVICE WITH INSERTION OF RIGHT FEMORAL ARTERIAL LINE;  Surgeon: Ivin Poot, MD;  Location: [place];  Service: Open Heart Surgery;  Laterality: N/A;  . REMOVAL OF PLEURAL DRAINAGE CATHETER Left 07/02/2019   Procedure: REMOVAL OF PLEURAL DRAINAGE CATHETER;  Surgeon: Ivin Poot, MD;  Location: [place];  Service: Thoracic;  Laterality: Left;  . RIGHT/LEFT HEART CATH AND CORONARY ANGIOGRAPHY N/A 03/15/2019   Procedure: RIGHT/LEFT HEART CATH AND CORONARY ANGIOGRAPHY;  Surgeon: Burnell Blanks, MD;  Location: [place] [facility];  Service:  Cardiovascular;  Laterality: N/A;  . TEE WITHOUT CARDIOVERSION N/A 03/16/2019   Procedure: TRANSESOPHAGEAL ECHOCARDIOGRAM (TEE);  Surgeon: Prescott Gum, Collier Salina, MD;  Location: [place];  Service: Open Heart Surgery;  Laterality: N/A;  . TRACHEOSTOMY TUBE PLACEMENT N/A 03/27/2019   Procedure: TRACHEOSTOMY placed using Shiley 8DCT Cuffed.;  Surgeon: Wonda Olds, MD;  [facility];  Service: Thoracic;  Laterality: N/A;  . TRANSURETHRAL RESECTION OF BLADDER NECK N/A 05/13/2019   Procedure: CYSTOSCOPY CLOT EVACUATION FULGRATION CYSTOGRAM AND INSTILLATION OF FORMALIN;  Surgeon: Lucas Mallow, MD;  Location: [place];  Service: Urology;  Laterality: N/A;   Past Medical History:  Diagnosis Date  . Anemia   . CAD (coronary artery disease)    a. s/p PCI in 1992 b. Coronary CT in 01/2018 showing extensive coronary calcification; FFR indeterminate --> medical management pursued at that time as patient not interested  in repeat cath. c. s/p CABGx3 in 02/2019.  . Cancer [facility])    prostate  . Cardiogenic shock ([place])   . CHB (complete heart block) (HCC)    a. due to vagal event in 2021, requiring temp pacer.  . Chronic combined systolic and diastolic CHF (congestive heart failure) ([place])   . Debilitated   . Dyspnea   . End stage renal disease ([place])   . Esophagitis   . H/O tracheostomy    a. during admit in 2021 for respiratory failure.  . Hematuria   . Hypertension   . Hypoalbuminemia   . Ischemic cardiomyopathy   . PAF (paroxysmal atrial fibrillation) ([place])   . Pleural effusion, left    a. s/p pleurX catheter  . Pseudomonas pneumonia ([place])   . Thrombocytopenia (HCC)    BP (!) 94/54   Pulse 81   Temp 98.7 F (37.1 C)   Ht 5\' 8"  (1.727 m)   Wt 164 lb 6.4 oz (74.6 kg)   SpO2 94%   BMI 25.00 kg/m   Opioid Risk Score:   Fall Risk Score:  `1  Depression screen PHQ 2/9  Depression screen PHQ 2/9 07/09/2019  Decreased Interest 0  Down, Depressed, Hopeless 0  PHQ - 2 Score 0  Altered  sleeping 0  Tired, decreased energy 1  Change in appetite 1  Feeling bad or failure about yourself  0  Trouble concentrating 0  Moving slowly or fidgety/restless 0  Suicidal thoughts 0  PHQ-9 Score 2  Difficult doing work/chores Not difficult at all    Review of Systems  Constitutional: Positive for appetite change. Negative for diaphoresis.  Respiratory: Positive for cough.   Gastrointestinal: Positive for nausea and vomiting.  Musculoskeletal: Positive for gait problem.  Skin: Positive for rash.  Neurological: Positive for dizziness.  All other systems reviewed and are negative.      Objective:   Physical Exam Constitutional: NAD. Vital signs reviewed. HENT: Normocephalic.  Atraumatic. Respiratory: Normal effort.  No stridor.   GI: Non-distended. Skin: Warm and dry.  Intact. Psych: Flat. Musc: LE edema Neurologic: Alert Motor: 4/5 throughout    Assessment & Plan:  Right-handed male with history of CAD, chronic atrial fibrillation on Xarelto as well as non-Hodgkin's lymphoma, chronic anemia, hypertension, diastolic congestive heart failure, prostate cancer presents for hospital follow-up after receiving CIR for multifactorial debility.  1.  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  Impella device removed 04/10/2019.  Sternal precautions             Cont therapies  Cont follow up with CTS  2. Pain Management:   Controlled  3.  Acute systolic congestive heart failure/cardiogenic shock.    Cont weight checks  Cont follow up with Cards  4.  Hypoxic respiratory failure with Pseudomonas pneumonia/pleural effusion.  Status post tracheostomy 03/27/2019.  Decannulated 04/28/2019.             No longer requiring supplemental oxygen  Pleurex d/ced   5. 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    Follow up with Nephro   6.  Hematuria.    Follow up with Urology  7. GI bleed  Needs follow up with GI

## 2019-07-10 ENCOUNTER — Other Ambulatory Visit: Payer: Self-pay | Admitting: Cardiothoracic Surgery

## 2019-07-10 DIAGNOSIS — J90 Pleural effusion, not elsewhere classified: Secondary | ICD-10-CM

## 2019-07-11 ENCOUNTER — Ambulatory Visit (INDEPENDENT_AMBULATORY_CARE_PROVIDER_SITE_OTHER): Payer: Medicare Other | Admitting: Cardiothoracic Surgery

## 2019-07-11 ENCOUNTER — Ambulatory Visit
Admission: RE | Admit: 2019-07-11 | Discharge: 2019-07-11 | Disposition: A | Discharge status: Home / Self Care | Payer: Medicare Other | Source: Ambulatory Visit | Attending: Cardiothoracic Surgery | Admitting: Cardiothoracic Surgery

## 2019-07-11 ENCOUNTER — Other Ambulatory Visit: Payer: Self-pay

## 2019-07-11 ENCOUNTER — Encounter: Payer: Self-pay | Admitting: Cardiothoracic Surgery

## 2019-07-11 VITALS — BP 97/61 | HR 78 | Resp 20 | Ht 68.0 in | Wt 164.0 lb

## 2019-07-11 DIAGNOSIS — Z9889 Other specified postprocedural states: Secondary | ICD-10-CM

## 2019-07-11 DIAGNOSIS — Z8679 Personal history of other diseases of the circulatory system: Secondary | ICD-10-CM

## 2019-07-11 DIAGNOSIS — Z951 Presence of aortocoronary bypass graft: Secondary | ICD-10-CM | POA: Diagnosis not present

## 2019-07-11 DIAGNOSIS — J90 Pleural effusion, not elsewhere classified: Secondary | ICD-10-CM | POA: Insufficient documentation

## 2019-07-11 NOTE — Progress Notes (Signed)
PCP is Leeanne Rio, MD Referring Provider is Herminio Commons, MD  Chief Complaint  Patient presents with  . Routine Post Op    1 week f/u with CXR removal of PleurX, HX of CABG/MAZE    HPI: The patient returns for follow-up after removal of a left Pleurx catheter 10 days ago.  His chest x-ray today is personally reviewed which shows no recurrence of left pleural effusion.  The sutures are removed from the previous exit site and the counterincision and these are well-healed.  The patient continues to do well at home.  He is walking more.  He is tolerating dialysis.  His weight is stable.  He denies angina or symptoms of CHF.  Today he presents in a wheelchair with his son.  Patient plans on getting a COVID-19 vaccine through the dialysis center which I have supported.   Past Medical History:  Diagnosis Date  . Anemia   . CAD (coronary artery disease)    a. s/p PCI in 1992 b. Coronary CT in 01/2018 showing extensive coronary calcification; FFR indeterminate --> medical management pursued at that time as patient not interested in repeat cath. c. s/p CABGx3 in 02/2019.  . Cancer [facility])    prostate  . Cardiogenic shock ([place])   . CHB (complete heart block) (HCC)    a. due to vagal event in 2021, requiring temp pacer.  . Chronic combined systolic and diastolic CHF (congestive heart failure) ([place])   . Debilitated   . Dyspnea   . End stage renal disease ([place])   . Esophagitis   . H/O tracheostomy    a. during admit in 2021 for respiratory failure.  . Hematuria   . Hypertension   . Hypoalbuminemia   . Ischemic cardiomyopathy   . PAF (paroxysmal atrial fibrillation) ([place])   . Pleural effusion, left    a. s/p pleurX catheter  . Pseudomonas pneumonia ([place])   . Thrombocytopenia ([place])     Past Surgical History:  Procedure Laterality Date  . CHEST TUBE INSERTION Left 04/23/2019   Procedure: INSERTION PLEURAL DRAINAGE CATHETER TO DRAIN LEFT PLEURAL EFFUSION;  Surgeon: Ivin Poot, MD;  Location: [place];  Service: Thoracic;  Laterality: Left;  . CLIPPING OF ATRIAL APPENDAGE N/A 03/16/2019   Procedure: CLIPPING OF LEFT  ATRIAL APPENDAGE - USING ATRICLIP SIZE 40;  Surgeon: Ivin Poot, MD;  Location: [place];  Service: Open Heart Surgery;  Laterality: N/A;  . COLONOSCOPY N/A 08/25/2017   Procedure: COLONOSCOPY;  Surgeon: Rogene Houston, MD;  [facility];  Service: Endoscopy;  Laterality: N/A;  . CORONARY ARTERY BYPASS GRAFT N/A 03/16/2019   Procedure: CORONARY ARTERY BYPASS GRAFTING (CABG), ON PUMP, TIMES THREE, USING LEFT INTERNAL MAMMARY ARTERY, RIGHT GREATER SAPHENOUS VEIN HARVESTED ENDOSCOPICALLY;  Surgeon: Ivin Poot, MD;  Location: [place];  Service: Open Heart Surgery;  Laterality: N/A;  swan only  . CYSTOSCOPY WITH FULGERATION N/A 04/30/2019   Procedure: CYSTOSCOPY WITH FULGERATION;  Surgeon: Irine Seal, MD;  Location: [place];  Service: Urology;  Laterality: N/A;  . HERNIA REPAIR    . IR FLUORO GUIDE CV LINE LEFT  04/23/2019  . IR FLUORO GUIDE CV LINE RIGHT  05/24/2019  . IR US GUIDE VASC ACCESS LEFT  04/23/2019  . MAZE N/A 03/16/2019   Procedure: MAZE;  Surgeon: Ivin Poot, MD;  Location: [place];  Service: Open Heart Surgery;  Laterality: N/A;  . PLACEMENT OF IMPELLA LEFT VENTRICULAR ASSIST DEVICE Right  03/27/2019   Procedure: Placement Of Impella Left Ventricular Assist Device using ABIOMED Impella 5.5 with SmartAssist Device.;  Surgeon: Wonda Olds, MD;  [facility];  Service: Thoracic;  Laterality: Right;  . PROSTATE SURGERY    . REMOVAL OF IMPELLA LEFT VENTRICULAR ASSIST DEVICE N/A 04/10/2019   Procedure: REMOVAL OF IMPELLA LEFT VENTRICULAR ASSIST DEVICE WITH INSERTION OF RIGHT FEMORAL ARTERIAL LINE;  Surgeon: Ivin Poot, MD;  Location: [place];  Service: Open Heart Surgery;  Laterality: N/A;  . REMOVAL OF PLEURAL DRAINAGE CATHETER Left 07/02/2019   Procedure: REMOVAL OF PLEURAL DRAINAGE CATHETER;  Surgeon: Ivin Poot, MD;   Location: [place];  Service: Thoracic;  Laterality: Left;  . RIGHT/LEFT HEART CATH AND CORONARY ANGIOGRAPHY N/A 03/15/2019   Procedure: RIGHT/LEFT HEART CATH AND CORONARY ANGIOGRAPHY;  Surgeon: Burnell Blanks, MD;  Location: [place] [facility];  Service: Cardiovascular;  Laterality: N/A;  . TEE WITHOUT CARDIOVERSION N/A 03/16/2019   Procedure: TRANSESOPHAGEAL ECHOCARDIOGRAM (TEE);  Surgeon: Prescott Gum, Collier Salina, MD;  Location: [place];  Service: Open Heart Surgery;  Laterality: N/A;  . TRACHEOSTOMY TUBE PLACEMENT N/A 03/27/2019   Procedure: TRACHEOSTOMY placed using Shiley 8DCT Cuffed.;  Surgeon: Wonda Olds, MD;  [facility];  Service: Thoracic;  Laterality: N/A;  . TRANSURETHRAL RESECTION OF BLADDER NECK N/A 05/13/2019   Procedure: CYSTOSCOPY CLOT EVACUATION FULGRATION CYSTOGRAM AND INSTILLATION OF FORMALIN;  Surgeon: Lucas Mallow, MD;  Location: [place];  Service: Urology;  Laterality: N/A;    Family History  Problem Relation Age of Onset  . CAD Mother 82  . CAD Father 45  . CAD Sister   . CAD Brother     Social History Social History   Tobacco Use  . Smoking status: Former Smoker    Packs/day: 0.25    Years: 50.00    Pack years: 12.50    Types: Cigarettes    Quit date: 02/15/1990    Years since quitting: 29.4  . Smokeless tobacco: Never Used  Substance Use Topics  . Alcohol use: No  . Drug use: No    Current Outpatient Medications  Medication Sig Dispense Refill  . acetaminophen (TYLENOL) 325 MG tablet Take 650 mg by mouth every 6 (six) hours as needed for mild pain or headache.    . anticoagulant sodium citrate 4 GM/100ML SOLN 5 mLs by Intracatheter route every Monday, Wednesday, and Friday with hemodialysis.    Marland Kitchen aspirin 81 MG chewable tablet Chew 1 tablet (81 mg total) by mouth daily.    . Darbepoetin Alfa (ARANESP) 150 MCG/0.3ML SOSY injection Inject 0.3 mLs (150 mcg total) into the vein every Wednesday with hemodialysis. 1.68 mL   . famotidine (PEPCID) 10 MG  tablet Take 1 tablet (10 mg total) by mouth daily. 30 tablet 0  . ferric gluconate 125 mg in sodium chloride 0.9 % 100 mL Inject 125 mg into the vein every Monday, Wednesday, and Friday with hemodialysis.    Marland Kitchen Hydrocortisone (GERHARDT'S BUTT CREAM) CREA Apply 1 application topically 2 (two) times daily. 1 each 0  . hydrocortisone cream 1 % Apply topically 2 (two) times daily. (Patient taking differently: Apply 1 application topically 2 (two) times daily. ) 30 g 0  . Methoxy PEG-Epoetin Beta (MIRCERA IJ) Mircera    . multivitamin (RENA-VIT) TABS tablet Take 1 tablet by mouth at bedtime. 30 tablet 0  . nystatin cream (MYCOSTATIN) Apply 1 application topically 2 (two) times daily. private area    . oxyCODONE (  OXY IR/ROXICODONE) 5 MG immediate release tablet Take 1 tablet (5 mg total) by mouth every 4 (four) hours as needed for moderate pain. 20 tablet 0  . scopolamine (TRANSDERM-SCOP) 1 MG/3DAYS Place 1 patch (1.5 mg total) onto the skin every 3 (three) days. 10 patch 12   No current facility-administered medications for this visit.    Allergies  Allergen Reactions  . Atorvastatin Anaphylaxis and Rash    Leg pain  . Amiodarone     Rash (see 05/2019 hospital notes), but was on other medications at the time so unclear which agent   . Heparin Other (See Comments)    Thrombocytopenia/ HIT  . Reglan [Metoclopramide]     Rash (see 05/2019 hospital notes), but was on other medications at the time so unclear which agent    Review of Systems   Some difficulty with swallowing intermittently.  Patient counseled on using chin tuck strategy and avoiding straws for drinking  Mild persistent ankle edema. Rash on left side is improved after removal of tape around the Pleurx site  BP 97/61   Pulse 78   Resp 20   Ht 5\' 8"  (1.727 m)   Wt 164 lb (74.4 kg)   SpO2 93% Comment: RA  BMI 24.94 kg/m  Physical Exam      Exam    General- alert and comfortable    Neck- no JVD, no cervical adenopathy  palpable, no carotid bruit   Lungs- clear without rales, wheezes   Cor- regular rate and rhythm, no murmur , gallop   Abdomen- soft, non-tender   Extremities - warm, non-tender, minimal edema   Neuro- oriented, appropriate, no focal weakness   Diagnostic Tests: Today's chest x-ray shows clear lung fields no pleural effusion sternal wires intact  Impression: Doing well after removal of left Pleurx catheter.  He remains in sinus rhythm. His surgical incisions are healing well. Patient encouraged to try to walk daily Swallowing strategies reviewed with patient and son  Plan: Patient return in 8 weeks with chest x-ray to assess for recurrence of pleural effusion.   Len Childs, MD [facility] [phone]

## 2019-08-06 ENCOUNTER — Other Ambulatory Visit: Payer: Self-pay | Admitting: *Deleted

## 2019-08-06 DIAGNOSIS — N184 Chronic kidney disease, stage 4 (severe): Secondary | ICD-10-CM

## 2019-08-08 ENCOUNTER — Ambulatory Visit (INDEPENDENT_AMBULATORY_CARE_PROVIDER_SITE_OTHER): Payer: Medicare Other | Admitting: Student

## 2019-08-08 ENCOUNTER — Other Ambulatory Visit: Payer: Self-pay

## 2019-08-08 ENCOUNTER — Encounter: Payer: Self-pay | Admitting: Student

## 2019-08-08 VITALS — BP 98/60 | HR 72 | Ht 69.0 in | Wt 159.0 lb

## 2019-08-08 DIAGNOSIS — I5042 Chronic combined systolic (congestive) and diastolic (congestive) heart failure: Secondary | ICD-10-CM | POA: Diagnosis not present

## 2019-08-08 DIAGNOSIS — I2510 Atherosclerotic heart disease of native coronary artery without angina pectoris: Secondary | ICD-10-CM

## 2019-08-08 DIAGNOSIS — Z992 Dependence on renal dialysis: Secondary | ICD-10-CM

## 2019-08-08 DIAGNOSIS — N186 End stage renal disease: Secondary | ICD-10-CM

## 2019-08-08 DIAGNOSIS — R1310 Dysphagia, unspecified: Secondary | ICD-10-CM

## 2019-08-08 NOTE — Patient Instructions (Signed)
Medication Instructions:  Your physician recommends that you continue on your current medications as directed. Please refer to the Current Medication list given to you today.  *If you need a refill on your cardiac medications before your next appointment, please call your pharmacy*  Wear Compression Stockings   Lab Work: NONE   If you have labs (blood work) drawn today and your tests are completely normal, you will receive your results only by: Marland Kitchen MyChart Message (if you have MyChart) OR . A paper copy in the mail If you have any lab test that is abnormal or we need to change your treatment, we will call you to review the results.   Testing/Procedures: Your physician has requested that you have an echocardiogram. Echocardiography is a painless test that uses sound waves to create images of your heart. It provides your doctor with information about the size and shape of your heart and how well your heart's chambers and valves are working. This procedure takes approximately one hour. There are no restrictions for this procedure.  Follow-Up: At [facility], you and your health needs are our priority.  As part of our continuing mission to provide you with exceptional heart care, we have created designated Provider Care Teams.  These Care Teams include your primary Cardiologist (physician) and Advanced Practice Providers (APPs -  Physician Assistants and Nurse Practitioners) who all work together to provide you with the care you need, when you need it.  We recommend signing up for the patient portal called "MyChart".  Sign up information is provided on this After Visit Summary.  MyChart is used to connect with patients for Virtual Visits (Telemedicine).  Patients are able to view lab/test results, encounter notes, upcoming appointments, etc.  Non-urgent messages can be sent to your provider as well.   To learn more about what you can do with MyChart, go to [email or web address].    Your next  appointment:   3 month(s)  The format for your next appointment:   In Person  Provider:   Kate Sable, MD or Bernerd Pho, PA-C   Other Instructions Thank you for choosing [facility]!

## 2019-08-08 NOTE — Progress Notes (Addendum)
Cardiology Office Note    Date:  08/08/2019   ID:  Jesse Murphy, Jesse Murphy 04/26/1928, MRN [id number]  PCP:  Leeanne Rio, MD  Cardiologist: Kate Sable, MD    Chief Complaint  Patient presents with  . Follow-up    33-month visit    History of Present Illness:    Jesse Murphy is a 86 y.o. male with past medical history of CAD (s/p PCI in 1992, CABG in 02/2019 with LIMA to LAD, SVG to OM1, SVG to PDA as well as MAZE and LAA clipping), chronic combined systolic and diastolic CHF (EF 05-39% by echo in 03/2019, 50-55% by TEE in 03/2019), ESRD, HTN, HLD, and history of prostate cancer who presents to the office today for 80-month follow-up.  He was last examined by Melina Copa, PA-C on 07/06/2019 following a prolonged hospitalization at [facility] from 03/13/2019 - 05/22/2019. He developed cardiogenic shock during admission which required Impella support post CABG and required tracheostomy due to acute hypoxic respiratory failure in the setting of Pseudomonas pneumonia. His AKI progressed to ESRD and he also had a recurrent large left pleural effusion which was managed by Pleurx catheter. He did have intermittent episodes of bradycardia which were felt to be vagally mediated in the context of bladder irrigation and did not require PPM placement. He developed a rash during admission and it was unclear if this was secondary to Reglan or Atorvastatin, therefore both medications were discontinued. He was then discharged to CIR.  At the time of his visit, he reported actually overall doing well and was working with Home Health PT. His Pleurx catheter had been removed by Dr. Prescott Gum on 07/02/2019. In regards to his recent NSTEMI, he was not started on Plavix but was continued on ASA 81 mg daily. He was on a beta-blocker due to recent CHB and statin therapy been discontinued given recent rash with Atorvastatin. A referral was placed to Urology given recurrent hematuria.   In talking with the patient  and his son today, he reports overall feeling weak since his recent hospitalization.  This is more notable on dialysis days (Tuesday, Thursday and Saturday). He experiences cramping after dialysis and sits in a wheelchair a majority of the day. He also reports his appetite has decreased since he was admitted earlier this year. When he consumes food, he feels like this becomes stuck in his throat. He was being followed by speech therapy and was informed to consume sips of water with each bite. He denies any specific orthopnea or PND. He does experience lower extremity edema. No recent chest pain or palpitations.  BP was initially recorded at 88/44, rechecked and at 98/60. He denies any known episodes of hypotension during HD. No current dizziness or presyncope.    Past Medical History:  Diagnosis Date  . Anemia   . CAD (coronary artery disease)    a. s/p PCI in 1992 b. Coronary CT in 01/2018 showing extensive coronary calcification; FFR indeterminate --> medical management pursued at that time as patient not interested in repeat cath. c. s/p CABGx3 in 02/2019.  . Cancer [facility])    prostate  . Cardiogenic shock ([place])   . CHB (complete heart block) (HCC)    a. due to vagal event in 2021, requiring temp pacer.  . Chronic combined systolic and diastolic CHF (congestive heart failure) ([place])   . Debilitated   . Dyspnea   . End stage renal disease ([place])   . Esophagitis   .  H/O tracheostomy    a. during admit in 2021 for respiratory failure.  . Hematuria   . Hypertension   . Hypoalbuminemia   . Ischemic cardiomyopathy   . PAF (paroxysmal atrial fibrillation) ([place])   . Pleural effusion, left    a. s/p pleurX catheter  . Pseudomonas pneumonia ([place])   . Thrombocytopenia ([place])     Past Surgical History:  Procedure Laterality Date  . CHEST TUBE INSERTION Left 04/23/2019   Procedure: INSERTION PLEURAL DRAINAGE CATHETER TO DRAIN LEFT PLEURAL EFFUSION;  Surgeon: Ivin Poot, MD;  Location: [place];   Service: Thoracic;  Laterality: Left;  . CLIPPING OF ATRIAL APPENDAGE N/A 03/16/2019   Procedure: CLIPPING OF LEFT  ATRIAL APPENDAGE - USING ATRICLIP SIZE 40;  Surgeon: Ivin Poot, MD;  Location: [place];  Service: Open Heart Surgery;  Laterality: N/A;  . COLONOSCOPY N/A 08/25/2017   Procedure: COLONOSCOPY;  Surgeon: Rogene Houston, MD;  [facility];  Service: Endoscopy;  Laterality: N/A;  . CORONARY ARTERY BYPASS GRAFT N/A 03/16/2019   Procedure: CORONARY ARTERY BYPASS GRAFTING (CABG), ON PUMP, TIMES THREE, USING LEFT INTERNAL MAMMARY ARTERY, RIGHT GREATER SAPHENOUS VEIN HARVESTED ENDOSCOPICALLY;  Surgeon: Ivin Poot, MD;  Location: [place];  Service: Open Heart Surgery;  Laterality: N/A;  swan only  . CYSTOSCOPY WITH FULGERATION N/A 04/30/2019   Procedure: CYSTOSCOPY WITH FULGERATION;  Surgeon: Irine Seal, MD;  Location: [place];  Service: Urology;  Laterality: N/A;  . HERNIA REPAIR    . IR FLUORO GUIDE CV LINE LEFT  04/23/2019  . IR FLUORO GUIDE CV LINE RIGHT  05/24/2019  . IR US GUIDE VASC ACCESS LEFT  04/23/2019  . MAZE N/A 03/16/2019   Procedure: MAZE;  Surgeon: Ivin Poot, MD;  Location: [place];  Service: Open Heart Surgery;  Laterality: N/A;  . PLACEMENT OF IMPELLA LEFT VENTRICULAR ASSIST DEVICE Right 03/27/2019   Procedure: Placement Of Impella Left Ventricular Assist Device using ABIOMED Impella 5.5 with SmartAssist Device.;  Surgeon: Wonda Olds, MD;  Location[facility];  Service: Thoracic;  Laterality: Right;  . PROSTATE SURGERY    . REMOVAL OF IMPELLA LEFT VENTRICULAR ASSIST DEVICE N/A 04/10/2019   Procedure: REMOVAL OF IMPELLA LEFT VENTRICULAR ASSIST DEVICE WITH INSERTION OF RIGHT FEMORAL ARTERIAL LINE;  Surgeon: Ivin Poot, MD;  Location: [place];  Service: Open Heart Surgery;  Laterality: N/A;  . REMOVAL OF PLEURAL DRAINAGE CATHETER Left 07/02/2019   Procedure: REMOVAL OF PLEURAL DRAINAGE CATHETER;  Surgeon: Ivin Poot, MD;  Location: [place];  Service:  Thoracic;  Laterality: Left;  . RIGHT/LEFT HEART CATH AND CORONARY ANGIOGRAPHY N/A 03/15/2019   Procedure: RIGHT/LEFT HEART CATH AND CORONARY ANGIOGRAPHY;  Surgeon: Burnell Blanks, MD;  Location: [place] CV [facility];  Service: Cardiovascular;  Laterality: N/A;  . TEE WITHOUT CARDIOVERSION N/A 03/16/2019   Procedure: TRANSESOPHAGEAL ECHOCARDIOGRAM (TEE);  Surgeon: Prescott Gum, Collier Salina, MD;  Location: [place];  Service: Open Heart Surgery;  Laterality: N/A;  . TRACHEOSTOMY TUBE PLACEMENT N/A 03/27/2019   Procedure: TRACHEOSTOMY placed using Shiley 8DCT Cuffed.;  Surgeon: Wonda Olds, MD;  [facility];  Service: Thoracic;  Laterality: N/A;  . TRANSURETHRAL RESECTION OF BLADDER NECK N/A 05/13/2019   Procedure: CYSTOSCOPY CLOT EVACUATION FULGRATION CYSTOGRAM AND INSTILLATION OF FORMALIN;  Surgeon: Lucas Mallow, MD;  Location: [place];  Service: Urology;  Laterality: N/A;    Current Medications: Outpatient Medications Prior to Visit  Medication Sig Dispense Refill  .  acetaminophen (TYLENOL) 325 MG tablet Take 650 mg by mouth every 6 (six) hours as needed for mild pain or headache.    . anticoagulant sodium citrate 4 GM/100ML SOLN 5 mLs by Intracatheter route every Monday, Wednesday, and Friday with hemodialysis.    Marland Kitchen aspirin 81 MG chewable tablet Chew 1 tablet (81 mg total) by mouth daily.    . Darbepoetin Alfa (ARANESP) 150 MCG/0.3ML SOSY injection Inject 0.3 mLs (150 mcg total) into the vein every Wednesday with hemodialysis. 1.68 mL   . famotidine (PEPCID) 10 MG tablet Take 1 tablet (10 mg total) by mouth daily. 30 tablet 0  . ferric gluconate 125 mg in sodium chloride 0.9 % 100 mL Inject 125 mg into the vein every Monday, Wednesday, and Friday with hemodialysis.    Marland Kitchen Hydrocortisone (GERHARDT'S BUTT CREAM) CREA Apply 1 application topically 2 (two) times daily. 1 each 0  . hydrocortisone cream 1 % Apply topically 2 (two) times daily. 30 g 0  . Methoxy PEG-Epoetin Beta (MIRCERA IJ)  Mircera    . multivitamin (RENA-VIT) TABS tablet Take 1 tablet by mouth at bedtime. 30 tablet 0  . nystatin cream (MYCOSTATIN) Apply 1 application topically 2 (two) times daily. private area    . oxyCODONE (OXY IR/ROXICODONE) 5 MG immediate release tablet Take 1 tablet (5 mg total) by mouth every 4 (four) hours as needed for moderate pain. 20 tablet 0  . scopolamine (TRANSDERM-SCOP) 1 MG/3DAYS Place 1 patch (1.5 mg total) onto the skin every 3 (three) days. (Patient not taking: Reported on 08/08/2019) 10 patch 12   No facility-administered medications prior to visit.     Allergies:   Atorvastatin, Amiodarone, Heparin, and Reglan [metoclopramide]   Social History   Socioeconomic History  . Marital status: Married    Spouse name: Not on file  . Number of children: 2  . Years of education: Not on file  . Highest education level: Not on file  Occupational History  . Occupation: Librarian, academic in supply    Comment: [employer]  Tobacco Use  . Smoking status: Former Smoker    Packs/day: 0.25    Years: 50.00    Pack years: 12.50    Types: Cigarettes    Quit date: 02/15/1990    Years since quitting: 29.4  . Smokeless tobacco: Never Used  Vaping Use  . Vaping Use: Never used  Substance and Sexual Activity  . Alcohol use: No  . Drug use: No  . Sexual activity: Not Currently  Other Topics Concern  . Not on file  Social History Narrative  . Not on file   Social Determinants of Health   Financial Resource Strain:   . Difficulty of Paying Living Expenses:   Food Insecurity:   . Worried About Charity fundraiser in the Last Year:   . Arboriculturist in the Last Year:   Transportation Needs:   . Film/video editor (Medical):   Marland Kitchen Lack of Transportation (Non-Medical):   Physical Activity:   . Days of Exercise per Week:   . Minutes of Exercise per Session:   Stress:   . Feeling of Stress :   Social Connections:   . Frequency of Communication with Friends and Family:   . Frequency  of Social Gatherings with Friends and Family:   . Attends Religious Services:   . Active Member of Clubs or Organizations:   . Attends Archivist Meetings:   Marland Kitchen Marital Status:      Family  History:  The patient's family history includes CAD in his brother and sister; CAD (age of onset: 49) in his mother; CAD (age of onset: 41) in his father.   Review of Systems:   Please see the history of present illness.     General:  No chills, fever, night sweats. Positive for decreased appetite and weight loss.  Cardiovascular:  No chest pain, dyspnea on exertion, edema, orthopnea, palpitations, paroxysmal nocturnal dyspnea. Dermatological: No rash, lesions/masses Respiratory: No cough, dyspnea Urologic: No hematuria, dysuria Abdominal:   No nausea, vomiting, diarrhea, bright red blood per rectum, melena, or hematemesis. Positive for dysphagia.  Neurologic:  No visual changes,  changes in mental status. All other systems reviewed and are otherwise negative except as noted above.   Physical Exam:    VS:  BP 98/60   Pulse 72   Ht 5\' 9"  (1.753 m)   Wt 159 lb (72.1 kg)   SpO2 95%   BMI 23.48 kg/m    General: Frail, elderly male appearing in no acute distress. Sitting in wheelchair.  Head: Normocephalic, atraumatic, sclera non-icteric.  Neck: No carotid bruits. JVD not elevated.  Lungs: Respirations regular and unlabored, without wheezes or rales.  Heart: Regular rate and rhythm. No S3 or S4.  No murmur, no rubs, or gallops appreciated. Sternal incision appears well-healed.  Abdomen: Soft, non-tender, non-distended. No obvious abdominal masses. Msk:  Strength and tone appear normal for age. No obvious joint deformities or effusions. Extremities: No clubbing or cyanosis. 1+ pitting edema up to knees bilaterally.  Distal pedal pulses are 2+ bilaterally. Neuro: Alert and oriented X 3. Moves all extremities spontaneously. No focal deficits noted. Psych:  Responds to questions  appropriately with a normal affect. Skin: No rashes or lesions noted  Wt Readings from Last 3 Encounters:  08/08/19 159 lb (72.1 kg)  07/11/19 164 lb (74.4 kg)  07/09/19 164 lb 6.4 oz (74.6 kg)     Studies/Labs Reviewed:   EKG:  EKG is not ordered today.  Recent Labs: 03/13/2019: TSH 3.683 03/27/2019: B Natriuretic Peptide 800.4 05/22/2019: Magnesium 2.2 06/01/2019: ALT 11 06/13/2019: BUN 29; Creatinine, Ser 5.65; Potassium 4.9; Sodium 135 07/06/2019: Hemoglobin 9.5; Platelets 141   Lipid Panel    Component Value Date/Time   CHOL 117 03/16/2019 0444   TRIG 52 03/16/2019 0444   HDL 33 (L) 03/16/2019 0444   CHOLHDL 3.5 03/16/2019 0444   VLDL 10 03/16/2019 0444   LDLCALC 74 03/16/2019 0444    Additional studies/ records that were reviewed today include:   Cardiac Catheterization: 02/2019  Dist LM to Ost LAD lesion is 80% stenosed.  Ost LM to Dist LM lesion is 50% stenosed.  Mid Cx lesion is 30% stenosed.  Prox RCA-1 lesion is 60% stenosed.  Prox RCA-2 lesion is 99% stenosed.   1. Severe calcific stenosis in the distal left main, ostial LAD and mid RCA 2. Elevated filling pressures  Recommendations: He has a severe, heavily calcified stenosis in the mid RCA. He also has a heavily calcified distal left main, ostial LAD stenosis. Revascularization with PCI would be high risk and require atherectomy in the ostial LAD extending back into the left main with chance of obstruction of flow into the Circumflex. He is 86 years old and would be high risk for CABG as well but he is overall a very functional patient for his age. I will ask CT surgery to see him to discuss CABG. He will need optimization of his heart rate control and  diuresis. I will start Lasix 40 mg IV BID.   Limited Echo: 03/2019 IMPRESSIONS    1. Left ventricular ejection fraction, by visual estimation, is 25 to  30%. The left ventricle has severely decreased function. There is no  increased left ventricular wall  thickness.  2. Abnormal septal motion consistent with left bundle branch block.  3. The left ventricle demonstrates global hypokinesis. Images inadequate  for focal wall motion abnormalities.  4. Left ventricular diastolic parameters are indeterminate due to atrial  fibrillation.  5. Global right ventricle has normal systolc function.The right  ventricular size is normal. no increase in right ventricular wall  thickness.  6. Mild to moderate mitral annular calcification. No evidence of mitral  valve regurgitation. No evidence of mitral stenosis.  7. The tricuspid valve was normal in structure. Tricuspid valve  regurgitation is not demonstrated.  8. There is heavy focal calcification of the right coronary cusp. The  mean AV gradient is 39mmHg which may be underestimated in the setting of  severe LV dysfunction.  9. The pulmonic valve was normal in structure.  10. The inferior vena cava is normal in size with greater than 50%  respiratory variability, suggesting right atrial pressure of 3 mmHg.  11. Small pericardial effusion.  12. The pericardial effusion is posterior to the left ventricle.   Assessment:    1. Coronary artery disease involving native coronary artery of native heart without angina pectoris   2. Chronic combined systolic and diastolic CHF (congestive heart failure) ([place])   3. ESRD on dialysis ([place])   4. Dysphagia, unspecified type      Plan:   In order of problems listed above:  1. CAD - He is s/p PCI in 1992 and recent CABG in 02/2019 with LIMA to LAD, SVG to OM1, SVG to PDA as well as MAZE and LAA clipping, - He denies any recent chest pain. Does have baseline dyspnea on exertion which has been stable.  - Continue ASA 81mg  daily. He is not on Plavix due to recent hematuria (now resolved but still being followed by Urology) and is no longer on anticoagulation given no recent recurrence of atrial fibrillation and due to hematuria. He had bradycardia during  admission and has hypotension which does not allow for the use of a BB. He developed a rash following statin initiation during admission. LDL was 74 during admission. Would recommend rechecking FLP in 2-3 months. If not at goal, consider the addition of Zetia.   2. Chronic Combined Systolic and Diastolic CHF - EF was at 93-71% by echo in 03/2019, 50-55% by TEE in 03/2019. He does have lower extremity edema on examination and volume status is managed by HD. We reviewed the importance of utilizing compression stockings and elevating his lower extremities.  - He is not on a BB due to transient bradycardia during admission and BP does not allow for this currently. Not on ACE-I/ARB/ARNI due to CKD and BP does not allow for Hydralazine or nitrates currently. Will plan to obtain a follow-up echocardiogram to reassess EF as he is greater than 4 months out from revascularization but options for medical therapy are limited given his hypotension and CKD.   3. ESRD - on HD (Tuesday, Thursday Saturday schedule). He does report worsening dizziness and fatigue on HD days. Hypotensive today as outlined above. Recommended the use of compression stockings. May need to consider Midodrine if symptoms do not improve. Will route today's note to Nephrology [facility]).   4. Dysphagia -  He reports dysphagia with solid foods and was previously being followed by speech therapy. He reports not having an appetite but also becomes concerned about choking. May need to consider GI referral for consideration of EGD if symptoms do not improve as this has been occurring since his surgery.    Medication Adjustments/Labs and Tests Ordered: Current medicines are reviewed at length with the patient today.  Concerns regarding medicines are outlined above.  Medication changes, Labs and Tests ordered today are listed in the Patient Instructions below. Patient Instructions  Medication Instructions:  Your physician recommends that you  continue on your current medications as directed. Please refer to the Current Medication list given to you today.  *If you need a refill on your cardiac medications before your next appointment, please call your pharmacy*  Wear Compression Stockings   Lab Work: NONE   If you have labs (blood work) drawn today and your tests are completely normal, you will receive your results only by: Marland Kitchen MyChart Message (if you have MyChart) OR . A paper copy in the mail If you have any lab test that is abnormal or we need to change your treatment, we will call you to review the results.   Testing/Procedures: Your physician has requested that you have an echocardiogram. Echocardiography is a painless test that uses sound waves to create images of your heart. It provides your doctor with information about the size and shape of your heart and how well your heart's chambers and valves are working. This procedure takes approximately one hour. There are no restrictions for this procedure.  Follow-Up: At [facility], you and your health needs are our priority.  As part of our continuing mission to provide you with exceptional heart care, we have created designated Provider Care Teams.  These Care Teams include your primary Cardiologist (physician) and Advanced Practice Providers (APPs -  Physician Assistants and Nurse Practitioners) who all work together to provide you with the care you need, when you need it.  We recommend signing up for the patient portal called "MyChart".  Sign up information is provided on this After Visit Summary.  MyChart is used to connect with patients for Virtual Visits (Telemedicine).  Patients are able to view lab/test results, encounter notes, upcoming appointments, etc.  Non-urgent messages can be sent to your provider as well.   To learn more about what you can do with MyChart, go to [email or web address].    Your next appointment:   3 month(s)  The format for your next  appointment:   In Person  Provider:   Kate Sable, MD or Bernerd Pho, PA-C   Other Instructions Thank you for choosing [facility]!       Signed, Erma Heritage, PA-C  08/08/2019 7:41 PM    [place] [address] [place],  [address] Phone: [phone] Fax: [phone]

## 2019-08-10 ENCOUNTER — Encounter (HOSPITAL_BASED_OUTPATIENT_CLINIC_OR_DEPARTMENT_OTHER): Payer: Medicare Other | Attending: Internal Medicine | Admitting: Internal Medicine

## 2019-08-10 DIAGNOSIS — Z951 Presence of aortocoronary bypass graft: Secondary | ICD-10-CM | POA: Diagnosis not present

## 2019-08-10 DIAGNOSIS — Z87891 Personal history of nicotine dependence: Secondary | ICD-10-CM | POA: Diagnosis not present

## 2019-08-10 DIAGNOSIS — I252 Old myocardial infarction: Secondary | ICD-10-CM | POA: Diagnosis not present

## 2019-08-10 DIAGNOSIS — Z923 Personal history of irradiation: Secondary | ICD-10-CM | POA: Insufficient documentation

## 2019-08-10 DIAGNOSIS — Z8546 Personal history of malignant neoplasm of prostate: Secondary | ICD-10-CM | POA: Diagnosis not present

## 2019-08-10 DIAGNOSIS — N3041 Irradiation cystitis with hematuria: Secondary | ICD-10-CM | POA: Insufficient documentation

## 2019-08-10 DIAGNOSIS — I2510 Atherosclerotic heart disease of native coronary artery without angina pectoris: Secondary | ICD-10-CM | POA: Diagnosis not present

## 2019-08-10 DIAGNOSIS — I509 Heart failure, unspecified: Secondary | ICD-10-CM | POA: Insufficient documentation

## 2019-08-10 DIAGNOSIS — N186 End stage renal disease: Secondary | ICD-10-CM | POA: Insufficient documentation

## 2019-08-10 DIAGNOSIS — Z992 Dependence on renal dialysis: Secondary | ICD-10-CM | POA: Insufficient documentation

## 2019-08-10 NOTE — Progress Notes (Signed)
REINHARDT, LICAUSI ([id number]) Visit Report for 08/10/2019 Allergy List Details Patient Name: Date of Service: KEAGHAN, STATON 08/10/2019 1:15 PM Medical Record Number: [id number] Patient Account Number: [id number] Date of Birth/Sex: Treating RN: 09-Feb-1929 (86 y.o. Ernestene Mention Primary Care Elizah Mierzwa: Saul Fordyce Other Clinician: Referring Ashly Yepez: Treating Del Wiseman/Extender: Martie Lee, Hartley Barefoot Weeks in Treatment: 0 Allergies Active Allergies atorvastatin Reaction: anaphylaxis Severity: Severe amiodarone Reaction: rash Severity: Moderate heparin Reaction: rash Severity: Moderate Reglan Allergy Notes Electronic Signature(s) Signed: 08/10/2019 4:55:10 PM By: Baruch Gouty RN, BSN Entered By: Baruch Gouty on 08/10/2019 13:17:42 -------------------------------------------------------------------------------- Arrival Information Details Patient Name: Date of Service: [name] 08/10/2019 1:15 PM Medical Record Number: [id number] Patient Account Number: [id number] Date of Birth/Sex: Treating RN: [birth year] (86 y.o. Ernestene Mention Primary Care Jodie Cavey: Saul Fordyce Other Clinician: Referring Mako Pelfrey: Treating Julionna Marczak/Extender: Lenise Arena in Treatment: 0 Visit Information Patient Arrived: Wheel Chair Arrival Time: 13:08 Accompanied By: son Transfer Assistance: None Patient Identification Verified: Yes Secondary Verification Process Completed: Yes Patient Requires Transmission-Based Precautions: No Patient Has Alerts: No Electronic Signature(s) Signed: 08/10/2019 4:55:10 PM By: Baruch Gouty RN, BSN Entered By: Baruch Gouty on 08/10/2019 13:14:42 -------------------------------------------------------------------------------- Clinic Level of Care Assessment Details Patient Name: Date of Service: NEITHAN, DAY. 08/10/2019 1:15 PM Medical Record Number: [id number] Patient Account Number: [id number] Date  of Birth/Sex: Treating RN: [birth year] (86 y.o. [name] Primary Care Boots Mcglown: Saul Fordyce Other Clinician: Referring Antonetta Clanton: Treating Eran Windish/Extender: Martie Lee, Antony Contras in Treatment: 0 Clinic Level of Care Assessment Items TOOL 2 Quantity Score X- 1 0 Use when only an EandM is performed on the INITIAL visit ASSESSMENTS - Nursing Assessment / Reassessment X- 1 20 General Physical Exam (combine w/ comprehensive assessment (listed just below) when performed on new pt. evals) X- 1 25 Comprehensive Assessment (HX, ROS, Risk Assessments, Wounds Hx, etc.) ASSESSMENTS - Wound and Skin A ssessment / Reassessment []  - 0 Simple Wound Assessment / Reassessment - one wound []  - 0 Complex Wound Assessment / Reassessment - multiple wounds []  - 0 Dermatologic / Skin Assessment (not related to wound area) ASSESSMENTS - Ostomy and/or Continence Assessment and Care []  - 0 Incontinence Assessment and Management []  - 0 Ostomy Care Assessment and Management (repouching, etc.) PROCESS - Coordination of Care X - Simple Patient / Family Education for ongoing care 1 15 []  - 0 Complex (extensive) Patient / Family Education for ongoing care X- 1 10 Staff obtains Programmer, systems, Records, T Results / Process Orders est []  - 0 Staff telephones HHA, Nursing Homes / Clarify orders / etc []  - 0 Routine Transfer to another Facility (non-emergent condition) []  - 0 Routine Hospital Admission (non-emergent condition) X- 1 15 New Admissions / Biomedical engineer / Ordering NPWT Apligraf, etc. , []  - 0 Emergency Hospital Admission (emergent condition) X- 1 10 Simple Discharge Coordination []  - 0 Complex (extensive) Discharge Coordination PROCESS - Special Needs []  - 0 Pediatric / Minor Patient Management []  - 0 Isolation Patient Management []  - 0 Hearing / Language / Visual special needs X- 1 15 Assessment of Community assistance (transportation, D/C  planning, etc.) []  - 0 Additional assistance / Altered mentation []  - 0 Support Surface(s) Assessment (bed, cushion, seat, etc.) INTERVENTIONS - Wound Cleansing / Measurement []  - 0 Wound Imaging (photographs - any number of wounds) []  - 0 Wound Tracing (instead of photographs) []  - 0 Simple Wound Measurement - one wound []  - 0 Complex Wound  Measurement - multiple wounds []  - 0 Simple Wound Cleansing - one wound []  - 0 Complex Wound Cleansing - multiple wounds INTERVENTIONS - Wound Dressings []  - 0 Small Wound Dressing one or multiple wounds []  - 0 Medium Wound Dressing one or multiple wounds []  - 0 Large Wound Dressing one or multiple wounds []  - 0 Application of Medications - injection INTERVENTIONS - Miscellaneous []  - 0 External ear exam []  - 0 Specimen Collection (cultures, biopsies, blood, body fluids, etc.) []  - 0 Specimen(s) / Culture(s) sent or taken to Lab for analysis []  - 0 Patient Transfer (multiple staff / Harrel Lemon Lift / Similar devices) []  - 0 Simple Staple / Suture removal (25 or less) []  - 0 Complex Staple / Suture removal (26 or more) []  - 0 Hypo / Hyperglycemic Management (close monitor of Blood Glucose) []  - 0 Ankle / Brachial Index (ABI) - do not check if billed separately Has the patient been seen at the hospital within the last three years: Yes Total Score: 110 Level Of Care: New/Established - Level 3 Electronic Signature(s) Signed: 08/10/2019 5:01:22 PM By: Kela Millin Entered By: Kela Millin on 08/10/2019 14:00:11 -------------------------------------------------------------------------------- Lower Extremity Assessment Details Patient Name: Date of Service: JB, DULWORTH 08/10/2019 1:15 PM Medical Record Number: [id number] Patient Account Number: [id number] Date of Birth/Sex: Treating RN: May 10, 1928 (86 y.o. Ernestene Mention Primary Care Miquela Costabile: Saul Fordyce Other Clinician: Referring Mica Releford: Treating  Lorane Cousar/Extender: Martie Lee, Antony Contras in Treatment: 0 Electronic Signature(s) Signed: 08/10/2019 4:55:10 PM By: Baruch Gouty RN, BSN Entered By: Baruch Gouty on 08/10/2019 13:22:01 -------------------------------------------------------------------------------- Multi Wound Chart Details Patient Name: Date of Service: [name] 08/10/2019 1:15 PM Medical Record Number: [id number] Patient Account Number: [id number] Date of Birth/Sex: Treating RN: December 03, 1928 (86 y.o. [name] Primary Care Mattix Imhof: Saul Fordyce Other Clinician: Referring Garmon Dehn: Treating Iliany Losier/Extender: Martie Lee, Antony Contras in Treatment: 0 Vital Signs Height(in): 68 Pulse(bpm): 69 Weight(lbs): 159 Blood Pressure(mmHg): 76/41 Body Mass Index(BMI): 24 Temperature(F): 97.6 Respiratory Rate(breaths/min): 20 Wound Assessments Treatment Notes Electronic Signature(s) Signed: 08/10/2019 5:01:22 PM By: Kela Millin Signed: 08/10/2019 5:27:17 PM By: Linton Ham MD Entered By: Linton Ham on 08/10/2019 14:16:39 -------------------------------------------------------------------------------- Multi-Disciplinary Care Plan Details Patient Name: Date of Service: [name] 08/10/2019 1:15 PM Medical Record Number: [id number] Patient Account Number: [id number] Date of Birth/Sex: Treating RN: 01-12-1929 (86 y.o. [name] Primary Care Keira Bohlin: Saul Fordyce Other Clinician: Referring Delpha Perko: Treating Gaylene Moylan/Extender: Martie Lee, Hartley Barefoot Weeks in Treatment: 0 Active Inactive Electronic Signature(s) Signed: 08/10/2019 5:01:22 PM By: Kela Millin Entered By: Kela Millin on 08/10/2019 14:28:29 -------------------------------------------------------------------------------- Pain Assessment Details Patient Name: Date of Service: [name] 08/10/2019 1:15 PM Medical Record Number:  [id number] Patient Account Number: [id number] Date of Birth/Sex: Treating RN: 02/13/1929 (86 y.o. Ernestene Mention Primary Care Jiovany Scheffel: Saul Fordyce Other Clinician: Referring Marvine Encalade: Treating Bren Borys/Extender: Martie Lee, Antony Contras in Treatment: 0 Active Problems Location of Pain Severity and Description of Pain Patient Has Paino No Site Locations Rate the pain. Rate the pain. Current Pain Level: 0 Pain Management and Medication Current Pain Management: Electronic Signature(s) Signed: 08/10/2019 4:55:10 PM By: Baruch Gouty RN, BSN Entered By: Baruch Gouty on 08/10/2019 13:41:17 -------------------------------------------------------------------------------- Patient/Caregiver Education Details Patient Name: Date of Service: [name] 6/25/2021andnbsp1:15 PM Medical Record Number: [id number] Patient Account Number: [id number] Date of Birth/Gender: Treating RN: [birth year] (86 y.o. [name] Primary Care Physician: Saul Fordyce Other Clinician:  Referring Physician: Treating Physician/Extender: Lenise Arena in Treatment: 0 Education Assessment Education Provided To: Patient and Caregiver Education Topics Provided Hyperbaric Oxygenation: Handouts: Hyperbaric Oxygen Methods: Explain/Verbal Responses: State content correctly Electronic Signature(s) Signed: 08/10/2019 5:01:22 PM By: Kela Millin Entered By: Kela Millin on 08/10/2019 13:59:08 -------------------------------------------------------------------------------- Vitals Details Patient Name: Date of Service: [name] 08/10/2019 1:15 PM Medical Record Number: [id number] Patient Account Number: [id number] Date of Birth/Sex: Treating RN: 12/27/1928 (86 y.o. Ernestene Mention Primary Care Rayya Yagi: Saul Fordyce Other Clinician: Referring Quatisha Zylka: Treating Eutimio Gharibian/Extender: Martie Lee, Antony Contras  in Treatment: 0 Vital Signs Time Taken: 13:14 Temperature (F): 97.6 Height (in): 68 Pulse (bpm): 69 Source: Stated Respiratory Rate (breaths/min): 20 Weight (lbs): 159 Blood Pressure (mmHg): 76/41 Source: Stated Reference Range: 80 - 120 mg / dl Body Mass Index (BMI): 24.2 Electronic Signature(s) Signed: 08/10/2019 4:55:10 PM By: Baruch Gouty RN, BSN Entered By: Baruch Gouty on 08/10/2019 13:15:18

## 2019-08-10 NOTE — Progress Notes (Signed)
[name], [name] ([id number]) Visit Report for 08/10/2019 Chief Complaint Document Details Patient Name: Date of Service: Jesse Murphy 08/10/2019 1:15 PM Medical Record Number: [id number] Patient Account Number: [id number] Date of Birth/Sex: Treating RN: 1928-04-02 (86 y.o. [name] Primary Care Provider: Saul Fordyce Other Clinician: Referring Provider: Treating Provider/Extender: Martie Lee, Antony Contras in Treatment: 0 Information Obtained from: Patient Chief Complaint 08/10/2019 Patient is here for consideration of hyperbaric treatment for radiation cystitis Electronic Signature(s) Signed: 08/10/2019 5:27:17 PM By: Linton Ham MD Entered By: Linton Ham on 08/10/2019 14:17:30 -------------------------------------------------------------------------------- HPI Details Patient Name: Date of Service: Jesse Gottron D. 08/10/2019 1:15 PM Medical Record Number: [id number] Patient Account Number: [id number] Date of Birth/Sex: Treating RN: Apr 13, 1928 (86 y.o. [name] Primary Care Provider: Saul Fordyce Other Clinician: Referring Provider: Treating Provider/Extender: Martie Lee, Antony Contras in Treatment: 0 History of Present Illness HPI Description: ADMISSION 08/10/2019 This is an 86 year old man who arrives in clinic accompanied by his son. He is here for consideration of hyperbaric oxygen for treatment of radiation cystitis. In brief the history started when the patient was hospitalized earlier this year for a prolonged period after presenting with new onset atrial fibrillation. He underwent a CABG but had multiple postop issues. Late in this admission he developed gross hematuria and urinary retention. He underwent 2 cystoscopies on 3/15 and 3/28. He had evaluation of the bladder and fulguration ultimately treated with intravesical instillation of formalin. 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 Noted that he had had a treatment with 40 treatments of radiation 8 years ago at [facility] in [place] for prostate cancer. He had not had any previous problems. Currently he voids incontinently but they have not noticed any bleeding for a month according to his son. The patient does not note any pain CVA tenderness no burning in fact he is not putting out any urine, currently as he has started on dialysis 3 times a week Past medical history includes coronary artery disease, chronic A. fib, heart failure with reduced ejection fraction in fact his last echocardiogram was in February and an EF of 25 to 30%, CABG x3 as noted gastroesophageal reflux disease prostate cancer 8 years ago end-stage renal disease on dialysis through a right subclavian line. Electronic Signature(s) Signed: 08/10/2019 5:27:17 PM By: Linton Ham MD Entered By: Linton Ham on 08/10/2019 14:21:58 -------------------------------------------------------------------------------- Physical Exam Details Patient Name: Date of Service: Jesse Gottron D. 08/10/2019 1:15 PM Medical Record Number: [id number] Patient Account Number: [id number] Date of Birth/Sex: Treating RN: 11-Jul-1928 (86 y.o. [name] Primary Care Provider: Saul Fordyce Other Clinician: Referring Provider: Treating Provider/Extender: Martie Lee, Hartley Barefoot Weeks in Treatment: 0 Constitutional Patient is hypotensive. However he is on dialysis and that seems to be slightly less than his norm normally in the 62G systolic. Respiratory work of breathing is normal. Bilateral breath sounds are clear and equal in all lobes with no wheezes, rales or rhonchi.. Cardiovascular Heart rhythm and rate regular, without murmur or gallop. Perhaps somewhat volume contracted. Gastrointestinal (GI) Abdomen is soft and non-distended without masses or tenderness.. Genitourinary (GU) Bladder was not at all distended no CVA  tenderness. Psychiatric appears at normal baseline. Electronic Signature(s) Signed: 08/10/2019 5:27:17 PM By: Linton Ham MD Entered By: Linton Ham on 08/10/2019 14:22:42 -------------------------------------------------------------------------------- Physician Orders Details Patient Name: Date of Service: Jesse Gottron D. 08/10/2019 1:15 PM Medical Record Number: [id number] Patient Account Number: [id number] Date  of Birth/Sex: Treating RN: 1928-02-20 (86 y.o. [name] Primary Care Provider: Saul Fordyce Other Clinician: Referring Provider: Treating Provider/Extender: Lenise Arena in Treatment: 0 Verbal / Phone Orders: No Diagnosis Coding Discharge From [facility] Discharge from [facility] - Was a consult for HBO, will discuss with family/son if they choose to do HBO at some point. Electronic Signature(s) Signed: 08/10/2019 5:01:22 PM By: Kela Millin Signed: 08/10/2019 5:27:17 PM By: Linton Ham MD Entered By: Kela Millin on 08/10/2019 14:05:22 -------------------------------------------------------------------------------- Problem List Details Patient Name: Date of Service: Jesse Gottron D. 08/10/2019 1:15 PM Medical Record Number: [id number] Patient Account Number: [id number] Date of Birth/Sex: Treating RN: 04-Nov-1928 (86 y.o. [name] Primary Care Provider: Saul Fordyce Other Clinician: Referring Provider: Treating Provider/Extender: Martie Lee, Hartley Barefoot Weeks in Treatment: 0 Active Problems ICD-10 Encounter Code Description Active Date MDM Diagnosis N30.41 Irradiation cystitis with hematuria 08/10/2019 No Yes Z85.46 Personal history of malignant neoplasm of prostate 08/10/2019 No Yes Inactive Problems Resolved Problems Electronic Signature(s) Signed: 08/10/2019 5:27:17 PM By: Linton Ham MD Entered By: Linton Ham on 08/10/2019  14:15:28 -------------------------------------------------------------------------------- Progress Note Details Patient Name: Date of Service: Jesse Gottron D. 08/10/2019 1:15 PM Medical Record Number: [id number] Patient Account Number: [id number] Date of Birth/Sex: Treating RN: 04-Feb-1929 (86 y.o. [name] Primary Care Provider: Saul Fordyce Other Clinician: Referring Provider: Treating Provider/Extender: Martie Lee, Antony Contras in Treatment: 0 Subjective Chief Complaint Information obtained from Patient 08/10/2019 Patient is here for consideration of hyperbaric treatment for radiation cystitis History of Present Illness (HPI) ADMISSION 08/10/2019 This is an 86 year old man who arrives in clinic accompanied by his son. He is here for consideration of hyperbaric oxygen for treatment of radiation cystitis. In brief the history started when the patient was hospitalized earlier this year for a prolonged period after presenting with new onset atrial fibrillation. He underwent a CABG but had multiple postop issues. Late in this admission he developed gross hematuria and urinary retention. He underwent 2 cystoscopies on 3/15 and 3/28. He had evaluation of the bladder and fulguration ultimately treated with intravesical instillation of formalin. 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 Noted that he had had a treatment with 40 treatments of radiation 8 years ago at [facility] in [place] for prostate cancer. He had not had any previous problems. Currently he voids incontinently but they have not noticed any bleeding for a month according to his son. The patient does not note any pain CVA tenderness no burning in fact he is not putting out any urine, currently as he has started on dialysis 3 times a week Past medical history includes coronary artery disease, chronic A. fib, heart failure with reduced ejection fraction in fact  his last echocardiogram was in February and an EF of 25 to 30%, CABG x3 as noted gastroesophageal reflux disease prostate cancer 8 years ago end-stage renal disease on dialysis through a right subclavian line. Patient History Information obtained from Patient. Allergies atorvastatin (Severity: Severe, Reaction: anaphylaxis), amiodarone (Severity: Moderate, Reaction: rash), heparin (Severity: Moderate, Reaction: rash), Reglan Family History Heart Disease - Mother,Father, Lung Disease - Siblings, No family history of Cancer, Diabetes, Hereditary Spherocytosis, Hypertension, Kidney Disease, Seizures, Stroke, Thyroid Problems, Tuberculosis. Social History Former smoker - quit 30 yr ago, Marital Status - Married, Alcohol Use - Never, Drug Use - No History, Caffeine Use - Moderate. Medical History Eyes Denies history of Cataracts, Glaucoma, Optic  Neuritis Hematologic/Lymphatic Patient has history of Anemia Denies history of Hemophilia, Human Immunodeficiency Virus, Lymphedema, Sickle Cell Disease Respiratory Denies history of Aspiration, Asthma, Chronic Obstructive Pulmonary Disease (COPD), Pneumothorax, Sleep Apnea, Tuberculosis Cardiovascular Patient has history of Arrhythmia - hx PAF, afib, Congestive Heart Failure, Coronary Artery Disease, Myocardial Infarction Denies history of Angina, Deep Vein Thrombosis, Hypertension, Hypotension, Peripheral Arterial Disease, Peripheral Venous Disease, Phlebitis Gastrointestinal Denies history of Cirrhosis , Colitis, Crohnoos, Hepatitis A, Hepatitis B, Hepatitis C Endocrine Denies history of Type I Diabetes, Type II Diabetes Genitourinary Patient has history of End Stage Renal Disease Integumentary (Skin) Denies history of History of Burn Musculoskeletal Denies history of Gout, Rheumatoid Arthritis, Osteoarthritis, Osteomyelitis Neurologic Denies history of Dementia, Neuropathy, Quadriplegia, Paraplegia, Seizure Disorder Oncologic Patient has  history of Received Radiation - prostate CA Denies history of Received Chemotherapy Psychiatric Patient has history of Anorexia/bulimia - poor appetite Denies history of Confinement Anxiety Hospitalization/Surgery History - CABG 3 vessel. - TURP. - cystoscopy. - trach insertion. - Maze. - cardiac cath. - TEE. - chest tube insertion. - hernia repair. Medical A Surgical History Notes nd Ear/Nose/Mouth/Throat dysphagia Respiratory hx pleural effusion Gastrointestinal GERD Genitourinary BPH, incontinence Review of Systems (ROS) Constitutional Symptoms (General Health) Denies complaints or symptoms of Fatigue, Fever, Chills, Marked Weight Change. Eyes Complains or has symptoms of Glasses / Contacts. Denies complaints or symptoms of Dry Eyes, Vision Changes. Ear/Nose/Mouth/Throat Denies complaints or symptoms of Chronic sinus problems or rhinitis. Respiratory Complains or has symptoms of Shortness of Breath - with exertion. Denies complaints or symptoms of Chronic or frequent coughs. Cardiovascular Denies complaints or symptoms of Chest pain. Gastrointestinal Denies complaints or symptoms of Frequent diarrhea, Nausea, Vomiting. Endocrine Denies complaints or symptoms of Heat/cold intolerance. Integumentary (Skin) Denies complaints or symptoms of Wounds. Musculoskeletal Complains or has symptoms of Muscle Weakness. Denies complaints or symptoms of Muscle Pain. Neurologic Denies complaints or symptoms of Numbness/parasthesias. Psychiatric Denies complaints or symptoms of Claustrophobia, Suicidal. Objective Constitutional Patient is hypotensive. However he is on dialysis and that seems to be slightly less than his norm normally in the 25E systolic. Vitals Time Taken: 1:14 PM, Height: 68 in, Source: Stated, Weight: 159 lbs, Source: Stated, BMI: 24.2, Temperature: 97.6 F, Pulse: 69 bpm, Respiratory Rate: 20 breaths/min, Blood Pressure: 76/41 mmHg. Respiratory work of breathing  is normal. Bilateral breath sounds are clear and equal in all lobes with no wheezes, rales or rhonchi.. Cardiovascular Heart rhythm and rate regular, without murmur or gallop. Perhaps somewhat volume contracted. Gastrointestinal (GI) Abdomen is soft and non-distended without masses or tenderness.. Genitourinary (GU) Bladder was not at all distended no CVA tenderness. Psychiatric appears at normal baseline. Assessment Active Problems ICD-10 Irradiation cystitis with hematuria Personal history of malignant neoplasm of prostate Plan Discharge From [facility] Services: Discharge from [place] - Was a consult for HBO, will discuss with family/son if they choose to do HBO at some point. 1. Felt to have radiation cystitis as documented by Dr. Jeffie Pollock and Dr. Gloriann Loan of [facility] 2. He has not currently had any bleeding in a month in fact he is not putting out any urine currently. 3. After discussing hyperbarics with the patient and his son the patient did not seem to wish to go forward with the treatment. Part of this is that he is not currently bleeding or voiding. However the majority of this was that he goes Tuesday Thursday and Saturday for dialysis at 12 noon in [place]. We would have to finish in hyperbaric treatments  probably around 8:00 on Tuesday and Thursday and he would be coming down here the rest of the days. He feels that is currently too much to consider. He will reconsider it if the bleeding becomes problematic again. 4. He also has a severe ischemic cardiomyopathy. I note that he has had a follow-up echocardiogram but I do not see this result. His ejection fraction would have to be above 30% and I would have to get cardiac clearance if the patient wishes to reconsider this. 5. I went over the benefits of hyperbaric oxygen for people with radiation cystitis. I did not talk about side effects as he does not seem to want to go through with this although I told him he  could call us and we will bring him back in if the problem becomes significant enough for him to reconsider I spent 35 minutes in review of this patient's past medical history, face-to-face evaluation and preparation of this record Electronic Signature(s) Signed: 08/10/2019 5:27:17 PM By: Linton Ham MD Entered By: Linton Ham on 08/10/2019 14:25:15 -------------------------------------------------------------------------------- HxROS Details Patient Name: Date of Service: Jesse Gottron D. 08/10/2019 1:15 PM Medical Record Number: [id number] Patient Account Number: [id number] Date of Birth/Sex: Treating RN: Sep 06, 1928 (86 y.o. Jesse Murphy Primary Care Provider: Saul Fordyce Other Clinician: Referring Provider: Treating Provider/Extender: Martie Lee, Antony Contras in Treatment: 0 Information Obtained From Patient Constitutional Symptoms (General Health) Complaints and Symptoms: Negative for: Fatigue; Fever; Chills; Marked Weight Change Eyes Complaints and Symptoms: Positive for: Glasses / Contacts Negative for: Dry Eyes; Vision Changes Medical History: Negative for: Cataracts; Glaucoma; Optic Neuritis Ear/Nose/Mouth/Throat Complaints and Symptoms: Negative for: Chronic sinus problems or rhinitis Medical History: Past Medical History Notes: dysphagia Respiratory Complaints and Symptoms: Positive for: Shortness of Breath - with exertion Negative for: Chronic or frequent coughs Medical History: Negative for: Aspiration; Asthma; Chronic Obstructive Pulmonary Disease (COPD); Pneumothorax; Sleep Apnea; Tuberculosis Past Medical History Notes: hx pleural effusion Cardiovascular Complaints and Symptoms: Negative for: Chest pain Medical History: Positive for: Arrhythmia - hx PAF, afib; Congestive Heart Failure; Coronary Artery Disease; Myocardial Infarction Negative for: Angina; Deep Vein Thrombosis; Hypertension; Hypotension; Peripheral Arterial  Disease; Peripheral Venous Disease; Phlebitis Gastrointestinal Complaints and Symptoms: Negative for: Frequent diarrhea; Nausea; Vomiting Medical History: Negative for: Cirrhosis ; Colitis; Crohns; Hepatitis A; Hepatitis B; Hepatitis C Past Medical History Notes: GERD Endocrine Complaints and Symptoms: Negative for: Heat/cold intolerance Medical History: Negative for: Type I Diabetes; Type II Diabetes Integumentary (Skin) Complaints and Symptoms: Negative for: Wounds Medical History: Negative for: History of Burn Musculoskeletal Complaints and Symptoms: Positive for: Muscle Weakness Negative for: Muscle Pain Medical History: Negative for: Gout; Rheumatoid Arthritis; Osteoarthritis; Osteomyelitis Neurologic Complaints and Symptoms: Negative for: Numbness/parasthesias Medical History: Negative for: Dementia; Neuropathy; Quadriplegia; Paraplegia; Seizure Disorder Psychiatric Complaints and Symptoms: Negative for: Claustrophobia; Suicidal Medical History: Positive for: Anorexia/bulimia - poor appetite Negative for: Confinement Anxiety Hematologic/Lymphatic Medical History: Positive for: Anemia Negative for: Hemophilia; Human Immunodeficiency Virus; Lymphedema; Sickle Cell Disease Genitourinary Medical History: Positive for: End Stage Renal Disease Past Medical History Notes: BPH, incontinence Immunological Oncologic Medical History: Positive for: Received Radiation - prostate CA Negative for: Received Chemotherapy Immunizations Pneumococcal Vaccine: Received Pneumococcal Vaccination: No Implantable Devices No devices added Hospitalization / Surgery History Type of Hospitalization/Surgery CABG 3 vessel TURP cystoscopy trach insertion Maze cardiac cath TEE chest tube insertion hernia repair Family and Social History Cancer: No; Diabetes: No; Heart Disease: Yes - Mother,Father; Hereditary Spherocytosis: No; Hypertension: No; Kidney Disease: No; Lung Disease:  Yes - Siblings; Seizures: No; Stroke: No; Thyroid Problems: No; Tuberculosis: No; Former smoker - quit 30 yr ago; Marital Status - Married; Alcohol Use: Never; Drug Use: No History; Caffeine Use: Moderate; Financial Concerns: No; Food, Clothing or Shelter Needs: No; Support System Lacking: No; Transportation Concerns: No Engineer, maintenance) Signed: 08/10/2019 4:55:10 PM By: Baruch Gouty RN, BSN Signed: 08/10/2019 5:27:17 PM By: Linton Ham MD Entered By: Baruch Gouty on 08/10/2019 13:40:57 -------------------------------------------------------------------------------- [place] Details Patient Name: Date of Service: Jesse Gottron D. 08/10/2019 Medical Record Number: [id number] Patient Account Number: [id number] Date of Birth/Sex: Treating RN: 1929-01-03 (86 y.o. [name] Primary Care Provider: Saul Fordyce Other Clinician: Referring Provider: Treating Provider/Extender: Martie Lee, Antony Contras in Treatment: 0 Diagnosis Coding ICD-10 Codes Code Description N30.41 Irradiation cystitis with hematuria Z85.46 Personal history of malignant neoplasm of prostate Facility Procedures CPT4 Code: [id number] Description: 99242 - WOUND CARE VISIT-LEV 3 EST PT Modifier: Quantity: 1 Physician Procedures : CPT4 Code Description Modifier [id number] [place] PHYS LEVEL 3 NEW PT ICD-10 Diagnosis Description N30.41 Irradiation cystitis with hematuria Z85.46 Personal history of malignant neoplasm of prostate Quantity: 1 Electronic Signature(s) Signed: 08/10/2019 5:27:17 PM By: Linton Ham MD Entered By: Linton Ham on 08/10/2019 14:26:55

## 2019-08-10 NOTE — Progress Notes (Signed)
Jesse, Murphy ([id number]) Visit Report for 08/10/2019 Abuse/Suicide Risk Screen Details Patient Name: Date of Service: Jesse Murphy, Jesse Murphy 08/10/2019 1:15 PM Medical Record Number: [id number] Patient Account Number: [id number] Date of Birth/Sex: Treating RN: [birth year] (86 y.o. Ernestene Mention Primary Care Laine Fonner: Saul Fordyce Other Clinician: Referring Masayoshi Couzens: Treating Tessi Eustache/Extender: Martie Lee, Antony Contras in Treatment: 0 Abuse/Suicide Risk Screen Items Answer ABUSE RISK SCREEN: Has anyone close to you tried to hurt or harm you recentlyo No Do you feel uncomfortable with anyone in your familyo No Has anyone forced you do things that you didnt want to doo No Electronic Signature(s) Signed: 08/10/2019 4:55:10 PM By: Baruch Gouty RN, BSN Entered By: Baruch Gouty on 08/10/2019 13:17:53 -------------------------------------------------------------------------------- Activities of Daily Living Details Patient Name: Date of Service: Jesse, Murphy 08/10/2019 1:15 PM Medical Record Number: [id number] Patient Account Number: [id number] Date of Birth/Sex: Treating RN: 03-25-1928 (86 y.o. Ernestene Mention Primary Care Ahmyah Gidley: Saul Fordyce Other Clinician: Referring Kensie Susman: Treating Dylin Breeden/Extender: Martie Lee, Antony Contras in Treatment: 0 Activities of Daily Living Items Answer Activities of Daily Living (Please select one for each item) Drive Automobile Not Able T Medications ake Completely Able Use T elephone Completely Able Care for Appearance Need Assistance Use T oilet Need Assistance Bath / Shower Need Assistance Dress Self Need Assistance Feed Self Completely Able Walk Need Assistance Get In / Out Bed Completely [place] for Self Need Assistance Electronic Signature(s) Signed: 08/10/2019 4:55:10 PM By: Baruch Gouty RN,  BSN Entered By: Baruch Gouty on 08/10/2019 13:18:55 -------------------------------------------------------------------------------- Education Screening Details Patient Name: Date of Service: Jesse Gottron D. 08/10/2019 1:15 PM Medical Record Number: [id number] Patient Account Number: [id number] Date of Birth/Sex: Treating RN: Nov 22, 1928 (86 y.o. Ernestene Mention Primary Care Kamar Callender: Saul Fordyce Other Clinician: Referring Mick Tanguma: Treating Larisa Lanius/Extender: Lenise Arena in Treatment: 0 Primary Learner Assessed: Patient Learning Preferences/Education Level/Primary Language Learning Preference: Explanation, Demonstration, Printed Material Highest Education Level: High School Preferred Language: English Cognitive Barrier Language Barrier: No Translator Needed: No Memory Deficit: No Emotional Barrier: No Cultural/Religious Beliefs Affecting Medical Care: No Physical Barrier Impaired Vision: Yes Glasses Impaired Hearing: No Decreased Hand dexterity: No Knowledge/Comprehension Knowledge Level: High Comprehension Level: High Ability to understand written instructions: High Ability to understand verbal instructions: High Motivation Anxiety Level: Calm Cooperation: Cooperative Education Importance: Acknowledges Need Interest in Health Problems: Asks Questions Perception: Coherent Willingness to Engage in Self-Management High Activities: Readiness to Engage in Self-Management High Activities: Electronic Signature(s) Signed: 08/10/2019 4:55:10 PM By: Baruch Gouty RN, BSN Entered By: Baruch Gouty on 08/10/2019 13:19:28 -------------------------------------------------------------------------------- Fall Risk Assessment Details Patient Name: Date of Service: Koffler, Rye D. 08/10/2019 1:15 PM Medical Record Number: [id number] Patient Account Number: [id number] Date of Birth/Sex: Treating RN: 12/04/1928 (86 y.o. Ernestene Mention Primary Care Reina Wilton: Saul Fordyce Other Clinician: Referring Rollan Roger: Treating Sagal Gayton/Extender: Martie Lee, Antony Contras in Treatment: 0 Fall Risk Assessment Items Have you had 2 or more falls in the last 12 monthso 0 No Have you had any fall that resulted in injury in the last 12 monthso 0 No FALLS RISK SCREEN History of falling - immediate or within 3 months 0 No Secondary diagnosis (Do you have 2 or more medical diagnoseso) 0 No Ambulatory aid None/bed rest/wheelchair/nurse 0 No Crutches/cane/walker 15 Yes Furniture 0 No Intravenous therapy Access/Saline/Heparin Lock 0 No Gait/Transferring Normal/ bed rest/ wheelchair 0  No Weak (short steps with or without shuffle, stooped but able to lift head while walking, may seek 10 Yes support from furniture) Impaired (short steps with shuffle, may have difficulty arising from chair, head down, impaired 0 No balance) Mental Status Oriented to own ability 0 Yes Electronic Signature(s) Signed: 08/10/2019 4:55:10 PM By: Baruch Gouty RN, BSN Entered By: Baruch Gouty on 08/10/2019 13:19:47 -------------------------------------------------------------------------------- Foot Assessment Details Patient Name: Date of Service: Jesse Gottron D. 08/10/2019 1:15 PM Medical Record Number: [id number] Patient Account Number: [id number] Date of Birth/Sex: Treating RN: 21-Jul-1928 (86 y.o. Ernestene Mention Primary Care Camila [place]ville: Saul Fordyce Other Clinician: Referring Shifa Brisbon: Treating Karigan Cloninger/Extender: Martie Lee, Antony Contras in Treatment: 0 Foot Assessment Items Site Locations + = Sensation present, - = Sensation absent, C = Callus, U = Ulcer R = Redness, W = Warmth, M = Maceration, PU = Pre-ulcerative lesion F = Fissure, S = Swelling, D = Dryness Assessment Right: Left: Other Deformity: No No Prior Foot Ulcer: No No Prior Amputation: No No Charcot Joint: No No Ambulatory Status:  Ambulatory With Help Assistance Device: Walker Gait: Steady Electronic Signature(s) Signed: 08/10/2019 4:55:10 PM By: Baruch Gouty RN, BSN Entered By: Baruch Gouty on 08/10/2019 13:21:53 -------------------------------------------------------------------------------- Nutrition Risk Screening Details Patient Name: Date of Service: Jesse Gottron D. 08/10/2019 1:15 PM Medical Record Number: [id number] Patient Account Number: [id number] Date of Birth/Sex: Treating RN: 02-21-1928 (86 y.o. Ernestene Mention Primary Care Dashaun Onstott: Saul Fordyce Other Clinician: Referring Dustin Bumbaugh: Treating Shamirah Ivan/Extender: Martie Lee, Antony Contras in Treatment: 0 Height (in): 68 Weight (lbs): 159 Body Mass Index (BMI): 24.2 Nutrition Risk Screening Items Score Screening NUTRITION RISK SCREEN: I have an illness or condition that made me change the kind and/or amount of food I eat 2 Yes I eat fewer than two meals per day 3 Yes I eat few fruits and vegetables, or milk products 0 No I have three or more drinks of beer, liquor or wine almost every day 0 No I have tooth or mouth problems that make it hard for me to eat 0 No I don't always have enough money to buy the food I need 0 No I eat alone most of the time 0 No I take three or more different prescribed or over-the-counter drugs a day 1 Yes Without wanting to, I have lost or gained 10 pounds in the last six months 2 Yes I am not always physically able to shop, cook and/or feed myself 0 No Nutrition Protocols Good Risk Protocol Moderate Risk Protocol High Risk Proctocol 0 Provide education on nutrition Risk Level: High Risk Score: 8 Electronic Signature(s) Signed: 08/10/2019 4:55:10 PM By: Baruch Gouty RN, BSN Entered By: Baruch Gouty on 08/10/2019 13:21:37

## 2019-08-14 ENCOUNTER — Other Ambulatory Visit: Payer: Self-pay | Admitting: Student

## 2019-08-14 DIAGNOSIS — I509 Heart failure, unspecified: Secondary | ICD-10-CM

## 2019-08-14 DIAGNOSIS — I2510 Atherosclerotic heart disease of native coronary artery without angina pectoris: Secondary | ICD-10-CM

## 2019-08-15 ENCOUNTER — Encounter: Payer: Self-pay | Admitting: Vascular Surgery

## 2019-08-15 ENCOUNTER — Ambulatory Visit (INDEPENDENT_AMBULATORY_CARE_PROVIDER_SITE_OTHER): Payer: Medicare Other | Admitting: Vascular Surgery

## 2019-08-15 ENCOUNTER — Ambulatory Visit (HOSPITAL_COMMUNITY)
Admission: RE | Admit: 2019-08-15 | Discharge: 2019-08-15 | Disposition: A | Discharge status: Home / Self Care | Payer: Medicare Other | Source: Ambulatory Visit | Attending: Vascular Surgery | Admitting: Vascular Surgery

## 2019-08-15 ENCOUNTER — Ambulatory Visit (INDEPENDENT_AMBULATORY_CARE_PROVIDER_SITE_OTHER)
Admission: RE | Admit: 2019-08-15 | Discharge: 2019-08-15 | Disposition: A | Discharge status: Home / Self Care | Payer: Medicare Other | Source: Ambulatory Visit | Attending: Vascular Surgery | Admitting: Vascular Surgery

## 2019-08-15 ENCOUNTER — Other Ambulatory Visit: Payer: Self-pay

## 2019-08-15 VITALS — BP 92/56 | HR 73 | Temp 97.2°F | Resp 20 | Ht 69.0 in | Wt 159.0 lb

## 2019-08-15 DIAGNOSIS — N186 End stage renal disease: Secondary | ICD-10-CM | POA: Diagnosis not present

## 2019-08-15 DIAGNOSIS — N184 Chronic kidney disease, stage 4 (severe): Secondary | ICD-10-CM | POA: Diagnosis present

## 2019-08-15 DIAGNOSIS — Z992 Dependence on renal dialysis: Secondary | ICD-10-CM

## 2019-08-15 DIAGNOSIS — I2510 Atherosclerotic heart disease of native coronary artery without angina pectoris: Secondary | ICD-10-CM

## 2019-08-15 NOTE — Progress Notes (Signed)
REASON FOR CONSULT:    To evaluate for hemodialysis access.  The consult is requested by Dr. Marval Regal.   ASSESSMENT & PLAN:   END-STAGE RENAL DISEASE: This patient appears to be a candidate for either a basilic vein transposition or an AV graft in the right arm.  I explained that if we did a basilic vein transposition we would likely do this in 2 stages. I have explained the indications for placement of an AV fistula or AV graft. I've explained that if at all possible we will place an AV fistula.  I have reviewed the risks of placement of an AV fistula including but not limited to: failure of the fistula to mature, need for subsequent interventions, and thrombosis. In addition I have reviewed the potential complications of placement of an AV graft. These risks include, but are not limited to, graft thrombosis, graft infection, wound healing problems, bleeding, arm swelling, and steal syndrome. All the patient's questions were answered and they are agreeable to proceed with surgery.  He dialyzes on Tuesdays Thursdays and Saturdays and will schedule this on a Monday.  [name], MD Office: [phone]   HPI:   Jesse Murphy is a pleasant 86 y.o. male, with a history of end-stage renal disease.  He states that he developed progressive renal insufficiency after coronary revascularization.  He has had a left IJ tunneled dialysis catheter for 2 to 3 months according to the patient.  He dialyzes on Tuesdays Thursdays and Saturdays.  He is right-handed.  He does not have a pacemaker.  He is not on any blood thinners.  He does admit to some anorexia but denies any other uremic symptoms.  Specifically, he denies nausea, vomiting, or fatigue.  His activity is fairly limited.  He spends a fair amount of time in a wheelchair.  He has had some chronic leg swelling.  Past Medical History:  Diagnosis Date  . Anemia   . CAD (coronary artery disease)    a. s/p PCI in 1992 b. Coronary CT in 01/2018  showing extensive coronary calcification; FFR indeterminate --> medical management pursued at that time as patient not interested in repeat cath. c. s/p CABGx3 in 02/2019.  . Cancer [facility])    prostate  . Cardiogenic shock ([place])   . CHB (complete heart block) (H[facility])    a. due to vagal event in 2021, requiring temp pacer.  . Chronic combined systolic and diastolic CHF (congestive heart failure) ([place])   . Debilitated   . Dyspnea   . End stage renal disease ([place])   . Esophagitis   . H/O tracheostomy    a. during admit in 2021 for respiratory failure.  . Hematuria   . Hypertension   . Hypoalbuminemia   . Ischemic cardiomyopathy   . PAF (paroxysmal atrial fibrillation) ([place])   . Pleural effusion, left    a. s/p pleurX catheter  . Pseudomonas pneumonia ([place])   . Thrombocytopenia ([place])     Family History  Problem Relation Age of Onset  . CAD Mother 16  . CAD Father 41  . CAD Sister   . CAD Brother     SOCIAL HISTORY: Social History   Socioeconomic History  . Marital status: Married    Spouse name: Not on file  . Number of children: 2  . Years of education: Not on file  . Highest education level: Not on file  Occupational History  . 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 Smoking status: Former Smoker    Packs/day: 0.25    Years: 50.00    Pack years: 12.50    Types: Cigarettes    Quit date: 02/15/1990    Years since quitting: 29.5  . Smokeless tobacco: Never Used  Vaping Use  . Vaping Use: Never used  Substance and Sexual Activity  . Alcohol use: No  . Drug use: No  . Sexual activity: Not Currently  Other Topics Concern  . Not on file  Social History Narrative  . Not on file   Social Determinants of Health   Financial Resource Strain:   . Difficulty of Paying Living Expenses:   Food Insecurity:   . Worried About Charity fundraiser in the Last Year:   . Arboriculturist in the Last Year:   Transportation Needs:   . Lexicographer (Medical):   Marland Kitchen Lack of Transportation (Non-Medical):   Physical Activity:   . Days of Exercise per Week:   . Minutes of Exercise per Session:   Stress:   . Feeling of Stress :   Social Connections:   . Frequency of Communication with Friends and Family:   . Frequency of Social Gatherings with Friends and Family:   . Attends Religious Services:   . Active Member of Clubs or Organizations:   . Attends Archivist Meetings:   Marland Kitchen Marital Status:   Intimate Partner Violence:   . Fear of Current or Ex-Partner:   . Emotionally Abused:   Marland Kitchen Physically Abused:   . Sexually Abused:     Allergies  Allergen Reactions  . Atorvastatin Anaphylaxis and Rash    Leg pain  . Amiodarone     Rash (see 05/2019 hospital notes), but was on other medications at the time so unclear which agent   . Heparin Other (See Comments)    Thrombocytopenia/ HIT  . Reglan [Metoclopramide]     Rash (see 05/2019 hospital notes), but was on other medications at the time so unclear which agent    Current Outpatient Medications  Medication Sig Dispense Refill  . acetaminophen (TYLENOL) 325 MG tablet Take 650 mg by mouth every 6 (six) hours as needed for mild pain or headache.    . anticoagulant sodium citrate 4 GM/100ML SOLN 5 mLs by Intracatheter route every Monday, Wednesday, and Friday with hemodialysis.    Marland Kitchen aspirin 81 MG chewable tablet Chew 1 tablet (81 mg total) by mouth daily.    . Darbepoetin Alfa (ARANESP) 150 MCG/0.3ML SOSY injection Inject 0.3 mLs (150 mcg total) into the vein every Wednesday with hemodialysis. 1.68 mL   . famotidine (PEPCID) 10 MG tablet Take 1 tablet (10 mg total) by mouth daily. 30 tablet 0  . ferric gluconate 125 mg in sodium chloride 0.9 % 100 mL Inject 125 mg into the vein every Monday, Wednesday, and Friday with hemodialysis.    Marland Kitchen Hydrocortisone (GERHARDT'S BUTT CREAM) CREA Apply 1 application topically 2 (two) times daily. 1 each 0  . hydrocortisone cream 1 %  Apply topically 2 (two) times daily. 30 g 0  . Methoxy PEG-Epoetin Beta (MIRCERA IJ) Mircera    . multivitamin (RENA-VIT) TABS tablet Take 1 tablet by mouth at bedtime. 30 tablet 0  . nystatin cream (MYCOSTATIN) Apply 1 application topically 2 (two) times daily. private area    . oxyCODONE (OXY IR/ROXICODONE) 5 MG immediate release tablet Take 1 tablet (5 mg total) by mouth every 4 (four) hours as  needed for moderate pain. 20 tablet 0   No current facility-administered medications for this visit.    REVIEW OF SYSTEMS:  [X]  denotes positive finding, [ ]  denotes negative finding Cardiac  Comments:  Chest pain or chest pressure:    Shortness of breath upon exertion: x   Short of breath when lying flat:    Irregular heart rhythm:        Vascular    Pain in calf, thigh, or hip brought on by ambulation:    Pain in feet at night that wakes you up from your sleep:     Blood clot in your veins:    Leg swelling:  x       Pulmonary    Oxygen at home: x   Productive cough:  x   Wheezing:         Neurologic    Sudden weakness in arms or legs:     Sudden numbness in arms or legs:     Sudden onset of difficulty speaking or slurred speech:    Temporary loss of vision in one eye:     Problems with dizziness:  x       Gastrointestinal    Blood in stool:     Vomited blood:         Genitourinary    Burning when urinating:     Blood in urine:        Psychiatric    Major depression:         Hematologic    Bleeding problems:    Problems with blood clotting too easily:        Skin    Rashes or ulcers:        Constitutional    Fever or chills:     PHYSICAL EXAM:   Vitals:   08/15/19 1444  BP: (!) 92/56  Pulse: 73  Resp: 20  Temp: (!) 97.2 F (36.2 C)  SpO2: 92%  Weight: 159 lb (72.1 kg)  Height: 5\' 9"  (1.753 m)    GENERAL: The patient is a well-nourished male, in no acute distress. The vital signs are documented above. CARDIAC: There is a regular rate and rhythm.    VASCULAR: I do not detect carotid bruits. I cannot palpate radial pulses however I did listen with the Doppler he had biphasic radial signals and ulnar signals bilaterally. He has bilateral lower extremity swelling. I did not see any usable surface upper extremity veins. PULMONARY: There is good air exchange bilaterally without wheezing or rales. ABDOMEN: Soft and non-tender with normal pitched bowel sounds.  MUSCULOSKELETAL: There are no major deformities or cyanosis. NEUROLOGIC: No focal weakness or paresthesias are detected. SKIN: There are no ulcers or rashes noted. PSYCHIATRIC: The patient has a normal affect.  DATA:    ARTERIAL DUPLEX: I have independently interpreted his arterial duplex scan today.  On the right side there is a triphasic radial and ulnar waveform.  The brachial artery measures 0.48 cm in diameter.  On the left side there is a biphasic radial and ulnar waveform.  The brachial artery measures 0.45 cm in diameter.  UPPER EXTREMITY VEIN MAP: I have independently interpreted his upper extremity vein map.  On the right side the forearm and upper arm cephalic vein do not look adequate in size.  The basilic vein looks marginal in size.  On the left side the forearm and upper arm cephalic vein do not look adequate in size.  The basilic vein looks marginal in  size.

## 2019-08-15 NOTE — H&P (View-Only) (Signed)
REASON FOR CONSULT:    To evaluate for hemodialysis access.  The consult is requested by Dr. Marval Regal.   ASSESSMENT & PLAN:   END-STAGE RENAL DISEASE: This patient appears to be a candidate for either a basilic vein transposition or an AV graft in the right arm.  I explained that if we did a basilic vein transposition we would likely do this in 2 stages. I have explained the indications for placement of an AV fistula or AV graft. I've explained that if at all possible we will place an AV fistula.  I have reviewed the risks of placement of an AV fistula including but not limited to: failure of the fistula to mature, need for subsequent interventions, and thrombosis. In addition I have reviewed the potential complications of placement of an AV graft. These risks include, but are not limited to, graft thrombosis, graft infection, wound healing problems, bleeding, arm swelling, and steal syndrome. All the patient's questions were answered and they are agreeable to proceed with surgery.  He dialyzes on Tuesdays Thursdays and Saturdays and will schedule this on a Monday.  [name], MD Office: [phone]   HPI:   Jesse Murphy is a pleasant 86 y.o. male, with a history of end-stage renal disease.  He states that he developed progressive renal insufficiency after coronary revascularization.  He has had a left IJ tunneled dialysis catheter for 2 to 3 months according to the patient.  He dialyzes on Tuesdays Thursdays and Saturdays.  He is right-handed.  He does not have a pacemaker.  He is not on any blood thinners.  He does admit to some anorexia but denies any other uremic symptoms.  Specifically, he denies nausea, vomiting, or fatigue.  His activity is fairly limited.  He spends a fair amount of time in a wheelchair.  He has had some chronic leg swelling.  Past Medical History:  Diagnosis Date  . Anemia   . CAD (coronary artery disease)    a. s/p PCI in 1992 b. Coronary CT in 01/2018  showing extensive coronary calcification; FFR indeterminate --> medical management pursued at that time as patient not interested in repeat cath. c. s/p CABGx3 in 02/2019.  . Cancer [facility])    prostate  . Cardiogenic shock ([place])   . CHB (complete heart block) (HCC)    a. due to vagal event in 2021, requiring temp pacer.  . Chronic combined systolic and diastolic CHF (congestive heart failure) ([place])   . Debilitated   . Dyspnea   . End stage renal disease ([place])   . Esophagitis   . H/O tracheostomy    a. during admit in 2021 for respiratory failure.  . Hematuria   . Hypertension   . Hypoalbuminemia   . Ischemic cardiomyopathy   . PAF (paroxysmal atrial fibrillation) ([place])   . Pleural effusion, left    a. s/p pleurX catheter  . Pseudomonas pneumonia ([place])   . Thrombocytopenia ([place])     Family History  Problem Relation Age of Onset  . CAD Mother 73  . CAD Father 27  . CAD Sister   . CAD Brother     SOCIAL HISTORY: Social History   Socioeconomic History  . Marital status: Married    Spouse name: Not on file  . Number of children: 2  . Years of education: Not on file  . Highest education level: Not on file  Occupational History  . 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 Smoking status: Former Smoker    Packs/day: 0.25    Years: 50.00    Pack years: 12.50    Types: Cigarettes    Quit date: 02/15/1990    Years since quitting: 29.5  . Smokeless tobacco: Never Used  Vaping Use  . Vaping Use: Never used  Substance and Sexual Activity  . Alcohol use: No  . Drug use: No  . Sexual activity: Not Currently  Other Topics Concern  . Not on file  Social History Narrative  . Not on file   Social Determinants of Health   Financial Resource Strain:   . Difficulty of Paying Living Expenses:   Food Insecurity:   . Worried About Charity fundraiser in the Last Year:   . Arboriculturist in the Last Year:   Transportation Needs:   . Lexicographer (Medical):   Marland Kitchen Lack of Transportation (Non-Medical):   Physical Activity:   . Days of Exercise per Week:   . Minutes of Exercise per Session:   Stress:   . Feeling of Stress :   Social Connections:   . Frequency of Communication with Friends and Family:   . Frequency of Social Gatherings with Friends and Family:   . Attends Religious Services:   . Active Member of Clubs or Organizations:   . Attends Archivist Meetings:   Marland Kitchen Marital Status:   Intimate Partner Violence:   . Fear of Current or Ex-Partner:   . Emotionally Abused:   Marland Kitchen Physically Abused:   . Sexually Abused:     Allergies  Allergen Reactions  . Atorvastatin Anaphylaxis and Rash    Leg pain  . Amiodarone     Rash (see 05/2019 hospital notes), but was on other medications at the time so unclear which agent   . Heparin Other (See Comments)    Thrombocytopenia/ HIT  . Reglan [Metoclopramide]     Rash (see 05/2019 hospital notes), but was on other medications at the time so unclear which agent    Current Outpatient Medications  Medication Sig Dispense Refill  . acetaminophen (TYLENOL) 325 MG tablet Take 650 mg by mouth every 6 (six) hours as needed for mild pain or headache.    . anticoagulant sodium citrate 4 GM/100ML SOLN 5 mLs by Intracatheter route every Monday, Wednesday, and Friday with hemodialysis.    Marland Kitchen aspirin 81 MG chewable tablet Chew 1 tablet (81 mg total) by mouth daily.    . Darbepoetin Alfa (ARANESP) 150 MCG/0.3ML SOSY injection Inject 0.3 mLs (150 mcg total) into the vein every Wednesday with hemodialysis. 1.68 mL   . famotidine (PEPCID) 10 MG tablet Take 1 tablet (10 mg total) by mouth daily. 30 tablet 0  . ferric gluconate 125 mg in sodium chloride 0.9 % 100 mL Inject 125 mg into the vein every Monday, Wednesday, and Friday with hemodialysis.    Marland Kitchen Hydrocortisone (GERHARDT'S BUTT CREAM) CREA Apply 1 application topically 2 (two) times daily. 1 each 0  . hydrocortisone cream 1 %  Apply topically 2 (two) times daily. 30 g 0  . Methoxy PEG-Epoetin Beta (MIRCERA IJ) Mircera    . multivitamin (RENA-VIT) TABS tablet Take 1 tablet by mouth at bedtime. 30 tablet 0  . nystatin cream (MYCOSTATIN) Apply 1 application topically 2 (two) times daily. private area    . oxyCODONE (OXY IR/ROXICODONE) 5 MG immediate release tablet Take 1 tablet (5 mg total) by mouth every 4 (four) hours as  needed for moderate pain. 20 tablet 0   No current facility-administered medications for this visit.    REVIEW OF SYSTEMS:  [X]  denotes positive finding, [ ]  denotes negative finding Cardiac  Comments:  Chest pain or chest pressure:    Shortness of breath upon exertion: x   Short of breath when lying flat:    Irregular heart rhythm:        Vascular    Pain in calf, thigh, or hip brought on by ambulation:    Pain in feet at night that wakes you up from your sleep:     Blood clot in your veins:    Leg swelling:  x       Pulmonary    Oxygen at home: x   Productive cough:  x   Wheezing:         Neurologic    Sudden weakness in arms or legs:     Sudden numbness in arms or legs:     Sudden onset of difficulty speaking or slurred speech:    Temporary loss of vision in one eye:     Problems with dizziness:  x       Gastrointestinal    Blood in stool:     Vomited blood:         Genitourinary    Burning when urinating:     Blood in urine:        Psychiatric    Major depression:         Hematologic    Bleeding problems:    Problems with blood clotting too easily:        Skin    Rashes or ulcers:        Constitutional    Fever or chills:     PHYSICAL EXAM:   Vitals:   08/15/19 1444  BP: (!) 92/56  Pulse: 73  Resp: 20  Temp: (!) 97.2 F (36.2 C)  SpO2: 92%  Weight: 159 lb (72.1 kg)  Height: 5\' 9"  (1.753 m)    GENERAL: The patient is a well-nourished male, in no acute distress. The vital signs are documented above. CARDIAC: There is a regular rate and rhythm.    VASCULAR: I do not detect carotid bruits. I cannot palpate radial pulses however I did listen with the Doppler he had biphasic radial signals and ulnar signals bilaterally. He has bilateral lower extremity swelling. I did not see any usable surface upper extremity veins. PULMONARY: There is good air exchange bilaterally without wheezing or rales. ABDOMEN: Soft and non-tender with normal pitched bowel sounds.  MUSCULOSKELETAL: There are no major deformities or cyanosis. NEUROLOGIC: No focal weakness or paresthesias are detected. SKIN: There are no ulcers or rashes noted. PSYCHIATRIC: The patient has a normal affect.  DATA:    ARTERIAL DUPLEX: I have independently interpreted his arterial duplex scan today.  On the right side there is a triphasic radial and ulnar waveform.  The brachial artery measures 0.48 cm in diameter.  On the left side there is a biphasic radial and ulnar waveform.  The brachial artery measures 0.45 cm in diameter.  UPPER EXTREMITY VEIN MAP: I have independently interpreted his upper extremity vein map.  On the right side the forearm and upper arm cephalic vein do not look adequate in size.  The basilic vein looks marginal in size.  On the left side the forearm and upper arm cephalic vein do not look adequate in size.  The basilic vein looks marginal in  size.

## 2019-08-16 ENCOUNTER — Other Ambulatory Visit: Payer: Self-pay

## 2019-08-21 ENCOUNTER — Other Ambulatory Visit: Payer: Self-pay | Admitting: Cardiothoracic Surgery

## 2019-08-21 DIAGNOSIS — J90 Pleural effusion, not elsewhere classified: Secondary | ICD-10-CM

## 2019-08-22 ENCOUNTER — Other Ambulatory Visit: Payer: Self-pay

## 2019-08-22 ENCOUNTER — Ambulatory Visit (INDEPENDENT_AMBULATORY_CARE_PROVIDER_SITE_OTHER): Payer: Medicare Other | Admitting: Cardiothoracic Surgery

## 2019-08-22 ENCOUNTER — Encounter: Payer: Self-pay | Admitting: Cardiothoracic Surgery

## 2019-08-22 ENCOUNTER — Ambulatory Visit
Admission: RE | Admit: 2019-08-22 | Discharge: 2019-08-22 | Disposition: A | Discharge status: Home / Self Care | Payer: Medicare Other | Source: Ambulatory Visit | Attending: Cardiothoracic Surgery | Admitting: Cardiothoracic Surgery

## 2019-08-22 VITALS — BP 106/69 | HR 73 | Temp 97.5°F | Resp 20 | Ht 69.0 in | Wt 160.0 lb

## 2019-08-22 DIAGNOSIS — J90 Pleural effusion, not elsewhere classified: Secondary | ICD-10-CM

## 2019-08-22 DIAGNOSIS — I2510 Atherosclerotic heart disease of native coronary artery without angina pectoris: Secondary | ICD-10-CM

## 2019-08-22 DIAGNOSIS — Z951 Presence of aortocoronary bypass graft: Secondary | ICD-10-CM

## 2019-08-22 DIAGNOSIS — Z8709 Personal history of other diseases of the respiratory system: Secondary | ICD-10-CM | POA: Diagnosis not present

## 2019-08-22 MED ORDER — MIDODRINE HCL 2.5 MG PO TABS: 10.0000 mg | ORAL_TABLET | Freq: Two times a day (BID) | ORAL | Status: DC

## 2019-08-22 NOTE — Progress Notes (Signed)
PCP is Leeanne Rio, MD Referring Provider is Herminio Commons, MD  Chief Complaint  Patient presents with  . Pleural Effusion    8 week f/u with CXR    HPI: Scheduled visit with chest x-ray to assess for recurrent left pleural effusion.  He had a left Pleurx catheter removed this spring.  He is status post urgent multivessel CABG with ischemic cardiomyopathy and perioperative shock.  He is now on dialysis living with his son.  He denies any symptoms of heart failure other than ankle edema.  He is maintaining sinus rhythm.  His blood pressure has been noted to be low at the dialysis center and at home associated with feelings of dizziness and fatigue.  An echocardiogram is planned in 2 weeks at the [facility] cardiology office.  Patient is usually in a wheelchair at home but tries to get up and walk at least twice a day.  Today I reviewed his chest x-ray which shows no evidence of recurrent left pleural effusion.  Cardiac silhouette is stable and there is no significant pulmonary edema.  Past Medical History:  Diagnosis Date  . Anemia   . CAD (coronary artery disease)    a. s/p PCI in 1992 b. Coronary CT in 01/2018 showing extensive coronary calcification; FFR indeterminate --> medical management pursued at that time as patient not interested in repeat cath. c. s/p CABGx3 in 02/2019.  . Cancer [name])    prostate  . Cardiogenic shock ([place])   . CHB (complete heart block) (HCC)    a. due to vagal event in 2021, requiring temp pacer.  . Chronic combined systolic and diastolic CHF (congestive heart failure) ([place])   . Debilitated   . Dyspnea   . End stage renal disease ([place])   . Esophagitis   . H/O tracheostomy    a. during admit in 2021 for respiratory failure.  . Hematuria   . Hypertension   . Hypoalbuminemia   . Ischemic cardiomyopathy   . PAF (paroxysmal atrial fibrillation) ([place])   . Pleural effusion, left    a. s/p pleurX catheter  . Pseudomonas pneumonia ([place])   .  Thrombocytopenia ([place])     Past Surgical History:  Procedure Laterality Date  . CHEST TUBE INSERTION Left 04/23/2019   Procedure: INSERTION PLEURAL DRAINAGE CATHETER TO DRAIN LEFT PLEURAL EFFUSION;  Surgeon: Ivin Poot, MD;  Location: [place];  Service: Thoracic;  Laterality: Left;  . CLIPPING OF ATRIAL APPENDAGE N/A 03/16/2019   Procedure: CLIPPING OF LEFT  ATRIAL APPENDAGE - USING ATRICLIP SIZE 40;  Surgeon: Ivin Poot, MD;  Location: [place];  Service: Open Heart Surgery;  Laterality: N/A;  . COLONOSCOPY N/A 08/25/2017   Procedure: COLONOSCOPY;  Surgeon: Rogene Houston, MD;  [facility];  Service: Endoscopy;  Laterality: N/A;  . CORONARY ARTERY BYPASS GRAFT N/A 03/16/2019   Procedure: CORONARY ARTERY BYPASS GRAFTING (CABG), ON PUMP, TIMES THREE, USING LEFT INTERNAL MAMMARY ARTERY, RIGHT GREATER SAPHENOUS VEIN HARVESTED ENDOSCOPICALLY;  Surgeon: Ivin Poot, MD;  Location: [place];  Service: Open Heart Surgery;  Laterality: N/A;  swan only  . CYSTOSCOPY WITH FULGERATION N/A 04/30/2019   Procedure: CYSTOSCOPY WITH FULGERATION;  Surgeon: Irine Seal, MD;  Location: [place];  Service: Urology;  Laterality: N/A;  . HERNIA REPAIR    . IR FLUORO GUIDE CV LINE LEFT  04/23/2019  . IR FLUORO GUIDE CV LINE RIGHT  05/24/2019  . IR US GUIDE VASC ACCESS LEFT  04/23/2019  .  MAZE N/A 03/16/2019   Procedure: MAZE;  Surgeon: Ivin Poot, MD;  Location: [place];  Service: Open Heart Surgery;  Laterality: N/A;  . PLACEMENT OF IMPELLA LEFT VENTRICULAR ASSIST DEVICE Right 03/27/2019   Procedure: Placement Of Impella Left Ventricular Assist Device using ABIOMED Impella 5.5 with SmartAssist Device.;  Surgeon: Wonda Olds, MD;  Location[facility];  Service: Thoracic;  Laterality: Right;  . PROSTATE SURGERY    . REMOVAL OF IMPELLA LEFT VENTRICULAR ASSIST DEVICE N/A 04/10/2019   Procedure: REMOVAL OF IMPELLA LEFT VENTRICULAR ASSIST DEVICE WITH INSERTION OF RIGHT FEMORAL ARTERIAL LINE;  Surgeon: Ivin Poot, MD;  Location: [place];  Service: Open Heart Surgery;  Laterality: N/A;  . REMOVAL OF PLEURAL DRAINAGE CATHETER Left 07/02/2019   Procedure: REMOVAL OF PLEURAL DRAINAGE CATHETER;  Surgeon: Ivin Poot, MD;  Location: [place];  Service: Thoracic;  Laterality: Left;  . RIGHT/LEFT HEART CATH AND CORONARY ANGIOGRAPHY N/A 03/15/2019   Procedure: RIGHT/LEFT HEART CATH AND CORONARY ANGIOGRAPHY;  Surgeon: Burnell Blanks, MD;  Location: [place] [facility];  Service: Cardiovascular;  Laterality: N/A;  . TEE WITHOUT CARDIOVERSION N/A 03/16/2019   Procedure: TRANSESOPHAGEAL ECHOCARDIOGRAM (TEE);  Surgeon: Prescott Gum, Collier Salina, MD;  Location: [place];  Service: Open Heart Surgery;  Laterality: N/A;  . TRACHEOSTOMY TUBE PLACEMENT N/A 03/27/2019   Procedure: TRACHEOSTOMY placed using [place] 8DCT Cuffed.;  Surgeon: Wonda Olds, MD;  Location[facility];  Service: Thoracic;  Laterality: N/A;  . TRANSURETHRAL RESECTION OF BLADDER NECK N/A 05/13/2019   Procedure: CYSTOSCOPY CLOT EVACUATION FULGRATION CYSTOGRAM AND INSTILLATION OF FORMALIN;  Surgeon: Lucas Mallow, MD;  Location: [place];  Service: Urology;  Laterality: N/A;    Family History  Problem Relation Age of Onset  . CAD Mother 20  . CAD Father 32  . CAD Sister   . CAD Brother     Social History Social History   Tobacco Use  . Smoking status: Former Smoker    Packs/day: 0.25    Years: 50.00    Pack years: 12.50    Types: Cigarettes    Quit date: 02/15/1990    Years since quitting: 29.5  . Smokeless tobacco: Never Used  Vaping Use  . Vaping Use: Never used  Substance Use Topics  . Alcohol use: No  . Drug use: No    Current Outpatient Medications  Medication Sig Dispense Refill  . acetaminophen (TYLENOL) 325 MG tablet Take 650 mg by mouth every 6 (six) hours as needed for mild pain or headache.    . anticoagulant sodium citrate 4 GM/100ML SOLN 5 mLs by Intracatheter route every Monday, Wednesday, and Friday with  hemodialysis.    Marland Kitchen aspirin 81 MG chewable tablet Chew 1 tablet (81 mg total) by mouth daily.    . Darbepoetin Alfa (ARANESP) 150 MCG/0.3ML SOSY injection Inject 0.3 mLs (150 mcg total) into the vein every Wednesday with hemodialysis. 1.68 mL   . famotidine (PEPCID) 10 MG tablet Take 1 tablet (10 mg total) by mouth daily. 30 tablet 0  . ferric gluconate 125 mg in sodium chloride 0.9 % 100 mL Inject 125 mg into the vein every Monday, Wednesday, and Friday with hemodialysis.    Marland Kitchen Hydrocortisone (GERHARDT'S BUTT CREAM) CREA Apply 1 application topically 2 (two) times daily. 1 each 0  . hydrocortisone cream 1 % Apply topically 2 (two) times daily. 30 g 0  . Methoxy PEG-Epoetin Beta (MIRCERA IJ) Mircera    . multivitamin (RENA-VIT)  TABS tablet Take 1 tablet by mouth at bedtime. 30 tablet 0  . nystatin cream (MYCOSTATIN) Apply 1 application topically 2 (two) times daily. private area    . oxyCODONE (OXY IR/ROXICODONE) 5 MG immediate release tablet Take 1 tablet (5 mg total) by mouth every 4 (four) hours as needed for moderate pain. 20 tablet 0   No current facility-administered medications for this visit.    Allergies  Allergen Reactions  . Atorvastatin Anaphylaxis and Rash    Leg pain  . Amiodarone     Rash (see 05/2019 hospital notes), but was on other medications at the time so unclear which agent   . Heparin Other (See Comments)    Thrombocytopenia/ HIT  . Reglan [Metoclopramide]     Rash (see 05/2019 hospital notes), but was on other medications at the time so unclear which agent    Review of Systems   No fever No falls Recent hospitalizations Apnea no chest pain Fatigue and weakness and dizziness intermittently usually associated with low blood pressure, systolic less than 80  BP 106/69 (BP Location: Right Arm, Patient Position: Sitting, Cuff Size: Normal)   Pulse 73   Temp (!) 97.5 F (36.4 C) (Temporal)   Resp 20   Ht 5\' 9"  (1.753 m)   Wt 160 lb (72.6 kg)   SpO2 95% Comment:  RA  BMI 23.63 kg/m  Physical Exam  Elderly male in wheelchair alert responsive no acute distress Breath sounds clear No JVD Heart rate regular without murmur 2+ pedal edema Sternal incision well-healed Dialysis catheter site clean and dry  Diagnostic Tests: Chest x-ray personally reviewed showing no recurrent left pleural effusion  Impression: Patient stable but with limited exercise tolerance and episodes of hypotension.  He is on no antihypertensive medications.  I will start him on low-dose midodrine to help augment blood pressure so that he can walk daily without dizziness  Plan: Return for reassessment in 2 months.   Len Childs, MD [facility] [phone]

## 2019-09-05 ENCOUNTER — Ambulatory Visit (INDEPENDENT_AMBULATORY_CARE_PROVIDER_SITE_OTHER): Payer: Medicare Other

## 2019-09-05 DIAGNOSIS — T782XXA Anaphylactic shock, unspecified, initial encounter: Secondary | ICD-10-CM | POA: Insufficient documentation

## 2019-09-05 DIAGNOSIS — I2510 Atherosclerotic heart disease of native coronary artery without angina pectoris: Secondary | ICD-10-CM | POA: Diagnosis not present

## 2019-09-05 DIAGNOSIS — I509 Heart failure, unspecified: Secondary | ICD-10-CM | POA: Diagnosis not present

## 2019-09-05 LAB — ECHOCARDIOGRAM COMPLETE
Area-P 1/2: 3.27 cm2
Calc EF: 53 %
S' Lateral: 2.59 cm
Single Plane A2C EF: 54.2 %
Single Plane A4C EF: 50.5 %

## 2019-09-07 ENCOUNTER — Other Ambulatory Visit (HOSPITAL_COMMUNITY)
Admission: RE | Admit: 2019-09-07 | Discharge: 2019-09-07 | Disposition: A | Discharge status: Home / Self Care | Payer: Medicare Other | Source: Ambulatory Visit | Attending: Vascular Surgery | Admitting: Vascular Surgery

## 2019-09-07 ENCOUNTER — Encounter (HOSPITAL_COMMUNITY): Payer: Self-pay | Admitting: Vascular Surgery

## 2019-09-07 ENCOUNTER — Other Ambulatory Visit: Payer: Self-pay

## 2019-09-07 DIAGNOSIS — Z20822 Contact with and (suspected) exposure to covid-19: Secondary | ICD-10-CM | POA: Diagnosis not present

## 2019-09-07 DIAGNOSIS — Z01812 Encounter for preprocedural laboratory examination: Secondary | ICD-10-CM | POA: Insufficient documentation

## 2019-09-07 LAB — SARS CORONAVIRUS 2 (TAT 6-24 HRS): SARS Coronavirus 2: NEGATIVE

## 2019-09-07 NOTE — Anesthesia Preprocedure Evaluation (Addendum)
Anesthesia Evaluation  Patient identified by MRN, date of birth, ID band Patient awake    Reviewed: Allergy & Precautions, NPO status , Patient's Chart, lab work & pertinent test results  Airway Mallampati: II  TM Distance: >3 FB     Dental   Pulmonary shortness of breath, pneumonia, former smoker,    breath sounds clear to auscultation       Cardiovascular hypertension, + CAD, + Past MI and +CHF  + dysrhythmias  Rhythm:Regular Rate:Normal     Neuro/Psych    GI/Hepatic GERD  ,  Endo/Other  diabetes  Renal/GU Renal disease     Musculoskeletal   Abdominal   Peds  Hematology  (+) anemia ,   Anesthesia Other Findings   Reproductive/Obstetrics                            Anesthesia Physical Anesthesia Plan  ASA: III  Anesthesia Plan: MAC   Post-op Pain Management:    Induction: Intravenous  PONV Risk Score and Plan: 1 and Ondansetron, Midazolam and Dexamethasone  Airway Management Planned: Nasal Cannula and Nasal CPAP  Additional Equipment:   Intra-op Plan:   Post-operative Plan:   Informed Consent: I have reviewed the patients History and Physical, chart, labs and discussed the procedure including the risks, benefits and alternatives for the proposed anesthesia with the patient or authorized representative who has indicated his/her understanding and acceptance.     Dental advisory given  Plan Discussed with: Anesthesiologist and CRNA  Anesthesia Plan Comments: (PAT note written 09/07/2019 by Myra Gianotti, PA-C. )       Anesthesia Quick Evaluation

## 2019-09-07 NOTE — Progress Notes (Signed)
SDW-pre-op call completed by pt son, Elenore Rota [facility]). Son denies that pt C/O SOB and chest pain. Son stated that pt is under the care of Dr. Bronson Ing, Cardiology and Dr. Oswald Hillock, PCP. Son made aware to have pt stop taking vitamins, fish oil and herbal medications. Do not take any NSAIDs ie: Ibuprofen, Advil, Naproxen (Aleve), Motrin, BC and Goody Powder. Son made aware to have pt continue to quarantine. Son verbalized understanding of all pre-op instructions. See PA, Anesthesiology, note.

## 2019-09-07 NOTE — Progress Notes (Signed)
Anesthesia Chart Review: Jesse Murphy   Case: [id number] Date/Time: 09/10/19 0949   Procedure: RIGHT ARM 1ST STAGE BASCILIC VEIN TRANSPOSITION VERSES ARTERIOVENOUS GRAFT (Right )   Anesthesia type: Monitor Anesthesia Care   Pre-op diagnosis: END STAGE RENAL DISEASE   Location: [place] OR ROOM 11 / [place] OR   Surgeons: Angelia Mould, MD      DISCUSSION: Patient is an 86 year old male scheduled for the above procedure.  History includes former smoker (quit 1992), CAD ([place]; NSTEMI, s/p CABG 1/75/10 complicated by cardiogenic shock, recurrent left pleural effusion, Pseudomonas pneumonia, tracheostomy, bradycardia not requiring PPM, progression to ESRD; hospitalized 03/13/19-05/22/19), ischemic cardiomyopathy, chronic combined systolic and diastolic CHF, atrial fibrillation, HTN, dyspnea, B-cell lymphoma (of the scalp, s/p radiation 2019), prostate cancer (s/p radiation 2013), anemia, thrombocytopenia, ESRD.  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 - S/p emergency placement of transvenous temporary pacemaker lead 05/11/19.  - S/p tracheostomy with LVAD placement 03/27/19. S/p removal of Impella catheter 04/11/19.  - S/p CABG: LIMA-LAD, SVG-OM1, SVG-PDA; left atrial MAZE procedure, left atrial appendage AtriCure Clip 03/17/19.  Last evaluated by CT surgeon Dr. Prescott Gum on 08/22/19 for recurrent left pleural effusion follow-up. Left Pleurx Catheter 04/23/19 and s/p removal on 07/02/19. 08/22/19 CXR with only small left pleural effusion. He was stable but with limited exercise tolerance. Midodrine started for hypotension. Two month follow-up planned.   Last evaluated by cardiology on 08/08/19 by Bernerd Pho, PA-C.  Medication titration limited by hypotension and bradycardia.  Planning to update echo prior to next visit--done 09/05/19 showed showed  normalization of his LVEF (25-30% to 55-60%).   09/07/2019 presurgical COVID-19 test is in process.  He is a same-day work-up, so anesthesia team to evaluate on the day of surgery.   VS:   Wt Readings from Last 3 Encounters:  08/22/19 72.6 kg  08/15/19 72.1 kg  08/08/19 72.1 kg   BP Readings from Last 3 Encounters:  08/22/19 106/69  08/15/19 (!) 92/56  08/08/19 98/60   Pulse Readings from Last 3 Encounters:  08/22/19 73  08/15/19 73  08/08/19 72    PROVIDERS: Leeanne Rio, MD Kate Sable, MD is cardiologist Prescott Gum, Collier Salina, MD is CT surgeon Donato Heinz, MD is nephrologist Meda Klinefelter, MD is RAD-ONC [facility] CE) Derek Jack, MD is HEM-ONC   LABS: He is for labs on arrival.  As of 07/06/2019, hemoglobin 9.5 and platelet count 141.   IMAGES: CXR 08/22/19: FINDINGS: The heart size and mediastinal contours are within normal limits. Left internal jugular dialysis catheter is unchanged in position. No pneumothorax is noted. Right lung is clear. Minimal left basilar subsegmental atelectasis is noted with small left pleural effusion. Status post coronary bypass graft. The visualized skeletal structures are unremarkable. IMPRESSION: Minimal left basilar subsegmental atelectasis with small left pleural effusion. Aortic Atherosclerosis (ICD10-I70.0).   EKG: 07/06/2019: Normal sinus rhythm Rightward axis Nonspecific intraventricular block Cannot rule out anterior infarct, age undetermined T wave abnormality, consider inferior ischemia   CV: Echo 09/05/19: IMPRESSIONS  1. Left ventricular ejection fraction, by estimation, is 55 to 60%. The  left ventricle has normal function. Left ventricular endocardial border  not optimally defined to evaluate regional wall motion. Left ventricular  diastolic parameters were normal.  2. Right ventricular systolic function is normal. The right ventricular  size is normal. There is normal pulmonary  artery systolic pressure. The  estimated right  ventricular systolic pressure is 81.4 mmHg.  3. The mitral valve is abnormal, mildly thickened and calcified with  moderate annular calcification. Trivial mitral valve regurgitation.  4. The aortic valve is tricuspid. Aortic valve regurgitation is not  visualized. Mild to moderate aortic valve sclerosis/calcification is  present, without any evidence of aortic stenosis.  5. The inferior vena cava is normal in size with greater than 50%  respiratory variability, suggesting right atrial pressure of 3 mmHg.   Carotid US 03/15/19: Summary:  Right Carotid: Velocities in the right ICA are consistent with a 1-39%  stenosis.         The extracranial vessels were near-normal with only minimal  wall         thickening or plaque.  Left Carotid: Velocities in the left ICA are consistent with a 1-39%  stenosis.        The extracranial vessels were near-normal with only minimal  wall        thickening or plaque.  Vertebrals: Bilateral vertebral arteries demonstrate antegrade flow.  Subclavians: Normal flow hemodynamics were seen in bilateral subclavian        arteries.   Last cardiac cath was 03/15/2019 prior to CABG.   Past Medical History:  Diagnosis Date  . Anemia   . CAD (coronary artery disease)    a. s/p PCI in 1992 b. Coronary CT in 01/2018 showing extensive coronary calcification; FFR indeterminate --> medical management pursued at that time as patient not interested in repeat cath. c. s/p CABGx3 in 02/2019.  . Cancer [facility])    prostate  . Cardiogenic shock ([place])   . CHB (complete heart block) (H[facility])    a. due to vagal event in 2021, requiring temp pacer.  . Chronic combined systolic and diastolic CHF (congestive heart failure) ([place])   . Debilitated   . Dyspnea   . End stage renal disease ([place])   . Esophagitis   . H/O tracheostomy    a. during admit in 2021 for respiratory failure.  .  Hematuria   . Hypertension   . Hypoalbuminemia   . Ischemic cardiomyopathy   . PAF (paroxysmal atrial fibrillation) ([place])   . Pleural effusion, left    a. s/p pleurX catheter  . Pseudomonas pneumonia ([place])   . Thrombocytopenia ([place])     Past Surgical History:  Procedure Laterality Date  . CHEST TUBE INSERTION Left 04/23/2019   Procedure: INSERTION PLEURAL DRAINAGE CATHETER TO DRAIN LEFT PLEURAL EFFUSION;  Surgeon: Ivin Poot, MD;  Location: [place];  Service: Thoracic;  Laterality: Left;  . CLIPPING OF ATRIAL APPENDAGE N/A 03/16/2019   Procedure: CLIPPING OF LEFT  ATRIAL APPENDAGE - USING ATRICLIP SIZE 40;  Surgeon: Ivin Poot, MD;  Location: [place];  Service: Open Heart Surgery;  Laterality: N/A;  . COLONOSCOPY N/A 08/25/2017   Procedure: COLONOSCOPY;  Surgeon: Rogene [place], MD;  [facility];  Service: Endoscopy;  Laterality: N/A;  . CORONARY ARTERY BYPASS GRAFT N/A 03/16/2019   Procedure: CORONARY ARTERY BYPASS GRAFTING (CABG), ON PUMP, TIMES THREE, USING LEFT INTERNAL MAMMARY ARTERY, RIGHT GREATER SAPHENOUS VEIN HARVESTED ENDOSCOPICALLY;  Surgeon: Ivin Poot, MD;  Location: [place];  Service: Open Heart Surgery;  Laterality: N/A;  swan only  . CYSTOSCOPY WITH FULGERATION N/A 04/30/2019   Procedure: CYSTOSCOPY WITH FULGERATION;  Surgeon: Irine Seal, MD;  Location: [place];  Service: Urology;  Laterality: N/A;  . HERNIA REPAIR    . IR FLUORO GUIDE CV LINE LEFT  04/23/2019  . IR FLUORO GUIDE CV LINE RIGHT  05/24/2019  . IR US GUIDE VASC ACCESS LEFT  04/23/2019  . MAZE N/A 03/16/2019   Procedure: MAZE;  Surgeon: Ivin Poot, MD;  Location: [place];  Service: Open Heart Surgery;  Laterality: N/A;  . PLACEMENT OF IMPELLA LEFT VENTRICULAR ASSIST DEVICE Right 03/27/2019   Procedure: Placement Of Impella Left Ventricular Assist Device using ABIOMED Impella 5.5 with SmartAssist Device.;  Surgeon: Wonda Olds, MD;  Location: [facility];  Service: Thoracic;  Laterality: Right;   . PROSTATE SURGERY    . REMOVAL OF IMPELLA LEFT VENTRICULAR ASSIST DEVICE N/A 04/10/2019   Procedure: REMOVAL OF IMPELLA LEFT VENTRICULAR ASSIST DEVICE WITH INSERTION OF RIGHT FEMORAL ARTERIAL LINE;  Surgeon: Ivin Poot, MD;  Location: [place];  Service: Open Heart Surgery;  Laterality: N/A;  . REMOVAL OF PLEURAL DRAINAGE CATHETER Left 07/02/2019   Procedure: REMOVAL OF PLEURAL DRAINAGE CATHETER;  Surgeon: Ivin Poot, MD;  Location: [place];  Service: Thoracic;  Laterality: Left;  . RIGHT/LEFT HEART CATH AND CORONARY ANGIOGRAPHY N/A 03/15/2019   Procedure: RIGHT/LEFT HEART CATH AND CORONARY ANGIOGRAPHY;  Surgeon: Burnell Blanks, MD;  Location: [place] CV LAB;  Service: Cardiovascular;  Laterality: N/A;  . TEE WITHOUT CARDIOVERSION N/A 03/16/2019   Procedure: TRANSESOPHAGEAL ECHOCARDIOGRAM (TEE);  Surgeon: Prescott Gum, Collier Salina, MD;  Location: [place];  Service: Open Heart Surgery;  Laterality: N/A;  . TRACHEOSTOMY TUBE PLACEMENT N/A 03/27/2019   Procedure: TRACHEOSTOMY placed using [place] 8DCT Cuffed.;  Surgeon: Wonda Olds, MD;  Location[facility];  Service: Thoracic;  Laterality: N/A;  . TRANSURETHRAL RESECTION OF BLADDER NECK N/A 05/13/2019   Procedure: CYSTOSCOPY CLOT EVACUATION FULGRATION CYSTOGRAM AND INSTILLATION OF FORMALIN;  Surgeon: Lucas Mallow, MD;  Location: [place];  Service: Urology;  Laterality: N/A;    MEDICATIONS: . midodrine (PROAMATINE) tablet 10 mg   . acetaminophen (TYLENOL) 325 MG tablet  . aspirin 81 MG chewable tablet  . famotidine (PEPCID) 10 MG tablet  . Hydrocortisone (GERHARDT'S BUTT CREAM) CREA  . midodrine (PROAMATINE) 10 MG tablet  . multivitamin (RENA-VIT) TABS tablet  . oxyCODONE (OXY IR/ROXICODONE) 5 MG immediate release tablet  . anticoagulant sodium citrate 4 GM/100ML SOLN  . Darbepoetin Alfa (ARANESP) 150 MCG/0.3ML SOSY injection  . ferric gluconate 125 mg in sodium chloride 0.9 % 100 mL    Myra Gianotti, PA-C Surgical Short  Stay/Anesthesiology [facility] Phone [phone] [place] Phone [phone] 09/07/2019 2:09 PM

## 2019-09-10 ENCOUNTER — Ambulatory Visit (HOSPITAL_COMMUNITY): Payer: Medicare Other | Admitting: Vascular Surgery

## 2019-09-10 ENCOUNTER — Ambulatory Visit (HOSPITAL_COMMUNITY)
Admission: RE | Admit: 2019-09-10 | Discharge: 2019-09-10 | Disposition: A | Discharge status: Home / Self Care | Payer: Medicare Other | Attending: Vascular Surgery | Admitting: Vascular Surgery

## 2019-09-10 ENCOUNTER — Encounter: Payer: Medicare Other | Admitting: Physical Medicine & Rehabilitation

## 2019-09-10 ENCOUNTER — Other Ambulatory Visit: Payer: Self-pay

## 2019-09-10 ENCOUNTER — Encounter (HOSPITAL_COMMUNITY): Payer: Self-pay | Admitting: Vascular Surgery

## 2019-09-10 ENCOUNTER — Encounter (HOSPITAL_COMMUNITY)
Admission: RE | Disposition: A | Discharge status: Home / Self Care | Payer: Self-pay | Source: Home / Self Care | Attending: Vascular Surgery

## 2019-09-10 DIAGNOSIS — Z7982 Long term (current) use of aspirin: Secondary | ICD-10-CM | POA: Insufficient documentation

## 2019-09-10 DIAGNOSIS — N185 Chronic kidney disease, stage 5: Secondary | ICD-10-CM | POA: Diagnosis not present

## 2019-09-10 DIAGNOSIS — I5042 Chronic combined systolic (congestive) and diastolic (congestive) heart failure: Secondary | ICD-10-CM | POA: Insufficient documentation

## 2019-09-10 DIAGNOSIS — R0602 Shortness of breath: Secondary | ICD-10-CM | POA: Diagnosis not present

## 2019-09-10 DIAGNOSIS — Z8249 Family history of ischemic heart disease and other diseases of the circulatory system: Secondary | ICD-10-CM | POA: Diagnosis not present

## 2019-09-10 DIAGNOSIS — I480 Paroxysmal atrial fibrillation: Secondary | ICD-10-CM | POA: Diagnosis not present

## 2019-09-10 DIAGNOSIS — Z992 Dependence on renal dialysis: Secondary | ICD-10-CM | POA: Diagnosis not present

## 2019-09-10 DIAGNOSIS — Z888 Allergy status to other drugs, medicaments and biological substances status: Secondary | ICD-10-CM | POA: Diagnosis not present

## 2019-09-10 DIAGNOSIS — Z7901 Long term (current) use of anticoagulants: Secondary | ICD-10-CM | POA: Diagnosis not present

## 2019-09-10 DIAGNOSIS — Z951 Presence of aortocoronary bypass graft: Secondary | ICD-10-CM | POA: Diagnosis not present

## 2019-09-10 DIAGNOSIS — E1122 Type 2 diabetes mellitus with diabetic chronic kidney disease: Secondary | ICD-10-CM | POA: Diagnosis not present

## 2019-09-10 DIAGNOSIS — D631 Anemia in chronic kidney disease: Secondary | ICD-10-CM | POA: Insufficient documentation

## 2019-09-10 DIAGNOSIS — I255 Ischemic cardiomyopathy: Secondary | ICD-10-CM | POA: Diagnosis not present

## 2019-09-10 DIAGNOSIS — Z79899 Other long term (current) drug therapy: Secondary | ICD-10-CM | POA: Diagnosis not present

## 2019-09-10 DIAGNOSIS — N186 End stage renal disease: Secondary | ICD-10-CM | POA: Diagnosis present

## 2019-09-10 DIAGNOSIS — I700 Atherosclerosis of aorta: Secondary | ICD-10-CM | POA: Diagnosis not present

## 2019-09-10 DIAGNOSIS — E8809 Other disorders of plasma-protein metabolism, not elsewhere classified: Secondary | ICD-10-CM | POA: Diagnosis not present

## 2019-09-10 DIAGNOSIS — J690 Pneumonitis due to inhalation of food and vomit: Secondary | ICD-10-CM | POA: Insufficient documentation

## 2019-09-10 DIAGNOSIS — Z87891 Personal history of nicotine dependence: Secondary | ICD-10-CM | POA: Insufficient documentation

## 2019-09-10 DIAGNOSIS — D696 Thrombocytopenia, unspecified: Secondary | ICD-10-CM | POA: Insufficient documentation

## 2019-09-10 DIAGNOSIS — Z8546 Personal history of malignant neoplasm of prostate: Secondary | ICD-10-CM | POA: Diagnosis not present

## 2019-09-10 DIAGNOSIS — I2510 Atherosclerotic heart disease of native coronary artery without angina pectoris: Secondary | ICD-10-CM | POA: Insufficient documentation

## 2019-09-10 DIAGNOSIS — K219 Gastro-esophageal reflux disease without esophagitis: Secondary | ICD-10-CM | POA: Diagnosis not present

## 2019-09-10 DIAGNOSIS — I132 Hypertensive heart and chronic kidney disease with heart failure and with stage 5 chronic kidney disease, or end stage renal disease: Secondary | ICD-10-CM | POA: Insufficient documentation

## 2019-09-10 DIAGNOSIS — R0600 Dyspnea, unspecified: Secondary | ICD-10-CM | POA: Insufficient documentation

## 2019-09-10 HISTORY — DX: Acute myocardial infarction, unspecified: I21.9

## 2019-09-10 HISTORY — PX: BASCILIC VEIN TRANSPOSITION: SHX5742

## 2019-09-10 LAB — POCT I-STAT, CHEM 8
BUN: 9 mg/dL (ref 8–23)
Calcium, Ion: 1.02 mmol/L — ABNORMAL LOW (ref 1.15–1.40)
Chloride: 92 mmol/L — ABNORMAL LOW (ref 98–111)
Creatinine, Ser: 3.5 mg/dL — ABNORMAL HIGH (ref 0.61–1.24)
Glucose, Bld: 88 mg/dL (ref 70–99)
HCT: 32 % — ABNORMAL LOW (ref 39.0–52.0)
Hemoglobin: 10.9 g/dL — ABNORMAL LOW (ref 13.0–17.0)
Potassium: 3.1 mmol/L — ABNORMAL LOW (ref 3.5–5.1)
Sodium: 137 mmol/L (ref 135–145)
TCO2: 28 mmol/L (ref 22–32)

## 2019-09-10 SURGERY — TRANSPOSITION, VEIN, BASILIC
Anesthesia: Monitor Anesthesia Care | Site: Arm Upper | Laterality: Right

## 2019-09-10 MED ORDER — PROPOFOL 500 MG/50ML IV EMUL
INTRAVENOUS | Status: DC | PRN
  Administered 2019-09-10: 100 ug/kg/min via INTRAVENOUS

## 2019-09-10 MED ORDER — CHLORHEXIDINE GLUCONATE 4 % EX LIQD: 60.0000 mL | Freq: Once | CUTANEOUS | Status: DC

## 2019-09-10 MED ORDER — CHLORHEXIDINE GLUCONATE 0.12 % MT SOLN: 15.0000 mL | Freq: Once | OROMUCOSAL | Status: AC

## 2019-09-10 MED ORDER — SODIUM CHLORIDE 0.9 % IV SOLN: INTRAVENOUS | Status: DC

## 2019-09-10 MED ORDER — LIDOCAINE HCL (PF) 1 % IJ SOLN
INTRAMUSCULAR | Status: AC
  Filled 2019-09-10: qty 30

## 2019-09-10 MED ORDER — LACTATED RINGERS IV SOLN: INTRAVENOUS | Status: DC

## 2019-09-10 MED ORDER — SODIUM CHLORIDE 0.9 % IV SOLN
INTRAVENOUS | Status: AC | PRN
  Administered 2019-09-10: 500 mL

## 2019-09-10 MED ORDER — OXYCODONE HCL 5 MG PO TABS: 5.0000 mg | ORAL_TABLET | ORAL | 0 refills | Status: DC | PRN

## 2019-09-10 MED ORDER — LIDOCAINE 2% (20 MG/ML) 5 ML SYRINGE
INTRAMUSCULAR | Status: DC | PRN
  Administered 2019-09-10: 60 mg via INTRAVENOUS

## 2019-09-10 MED ORDER — PHENYLEPHRINE 40 MCG/ML (10ML) SYRINGE FOR IV PUSH (FOR BLOOD PRESSURE SUPPORT)
PREFILLED_SYRINGE | INTRAVENOUS | Status: DC | PRN
  Administered 2019-09-10 (×2): 40 ug via INTRAVENOUS
  Administered 2019-09-10: 80 ug via INTRAVENOUS

## 2019-09-10 MED ORDER — SODIUM CHLORIDE 0.9 % IV SOLN
0.0500 mg/kg/h | Freq: Once | INTRAVENOUS | Status: AC
  Administered 2019-09-10: .25 mg/kg/h via INTRAVENOUS
  Filled 2019-09-10: qty 250

## 2019-09-10 MED ORDER — CHLORHEXIDINE GLUCONATE 0.12 % MT SOLN
OROMUCOSAL | Status: AC
  Administered 2019-09-10: 15 mL via OROMUCOSAL
  Filled 2019-09-10: qty 15

## 2019-09-10 MED ORDER — LIDOCAINE HCL (PF) 1 % IJ SOLN
INTRAMUSCULAR | Status: DC | PRN
  Administered 2019-09-10: 18 mL

## 2019-09-10 MED ORDER — FENTANYL CITRATE (PF) 100 MCG/2ML IJ SOLN: 25.0000 ug | INTRAMUSCULAR | Status: DC | PRN

## 2019-09-10 MED ORDER — ONDANSETRON HCL 4 MG/2ML IJ SOLN
INTRAMUSCULAR | Status: DC | PRN
  Administered 2019-09-10: 4 mg via INTRAVENOUS

## 2019-09-10 MED ORDER — ORAL CARE MOUTH RINSE: 15.0000 mL | Freq: Once | OROMUCOSAL | Status: AC

## 2019-09-10 MED ORDER — CEFAZOLIN SODIUM-DEXTROSE 2-4 GM/100ML-% IV SOLN
2.0000 g | INTRAVENOUS | Status: AC
  Administered 2019-09-10: 2 g via INTRAVENOUS
  Filled 2019-09-10: qty 100

## 2019-09-10 MED ORDER — ANTICOAGULANT SODIUM CITRATE 4% (200MG/5ML) IV SOLN
5.0000 mL | Freq: Once | Status: AC
  Administered 2019-09-10: 5 mL
  Filled 2019-09-10: qty 5

## 2019-09-10 MED ORDER — BIVALIRUDIN BOLUS VIA INFUSION - CUPID
INTRAVENOUS | Status: DC | PRN
  Administered 2019-09-10: 54.5 mg via INTRAVENOUS

## 2019-09-10 MED ORDER — 0.9 % SODIUM CHLORIDE (POUR BTL) OPTIME
TOPICAL | Status: DC | PRN
  Administered 2019-09-10: 1000 mL

## 2019-09-10 SURGICAL SUPPLY — 36 items
ARMBAND PINK RESTRICT EXTREMIT (MISCELLANEOUS) ×3 IMPLANT
CANISTER SUCT 3000ML PPV (MISCELLANEOUS) ×3 IMPLANT
CANNULA VESSEL 3MM 2 BLNT TIP (CANNULA) ×3 IMPLANT
CLIP VESOCCLUDE MED 24/CT (CLIP) ×3 IMPLANT
CLIP VESOCCLUDE SM WIDE 24/CT (CLIP) ×3 IMPLANT
COVER PROBE W GEL 5X96 (DRAPES) ×3 IMPLANT
COVER WAND RF STERILE (DRAPES) IMPLANT
DECANTER SPIKE VIAL GLASS SM (MISCELLANEOUS) ×3 IMPLANT
DERMABOND ADVANCED (GAUZE/BANDAGES/DRESSINGS) ×2
DERMABOND ADVANCED .7 DNX12 (GAUZE/BANDAGES/DRESSINGS) ×1 IMPLANT
ELECT REM PT RETURN 9FT ADLT (ELECTROSURGICAL) ×3
ELECTRODE REM PT RTRN 9FT ADLT (ELECTROSURGICAL) ×1 IMPLANT
GLOVE BIO SURGEON STRL SZ7.5 (GLOVE) ×3 IMPLANT
GLOVE BIOGEL PI IND STRL 6.5 (GLOVE) ×3 IMPLANT
GLOVE BIOGEL PI IND STRL 7.0 (GLOVE) ×1 IMPLANT
GLOVE BIOGEL PI IND STRL 8 (GLOVE) ×2 IMPLANT
GLOVE BIOGEL PI INDICATOR 6.5 (GLOVE) ×6
GLOVE BIOGEL PI INDICATOR 7.0 (GLOVE) ×2
GLOVE BIOGEL PI INDICATOR 8 (GLOVE) ×4
GLOVE ECLIPSE 6.5 STRL STRAW (GLOVE) ×3 IMPLANT
GOWN STRL REUS W/ TWL LRG LVL3 (GOWN DISPOSABLE) ×3 IMPLANT
GOWN STRL REUS W/TWL LRG LVL3 (GOWN DISPOSABLE) ×6
KIT BASIN OR (CUSTOM PROCEDURE TRAY) ×3 IMPLANT
KIT TURNOVER KIT B (KITS) ×3 IMPLANT
NS IRRIG 1000ML POUR BTL (IV SOLUTION) ×3 IMPLANT
PACK CV ACCESS (CUSTOM PROCEDURE TRAY) ×3 IMPLANT
PAD ARMBOARD 7.5X6 YLW CONV (MISCELLANEOUS) ×6 IMPLANT
SPONGE SURGIFOAM ABS GEL 100 (HEMOSTASIS) IMPLANT
SUT PROLENE 6 0 BV (SUTURE) ×3 IMPLANT
SUT SILK 2 0 SH (SUTURE) IMPLANT
SUT VIC AB 3-0 SH 27 (SUTURE) ×2
SUT VIC AB 3-0 SH 27X BRD (SUTURE) ×1 IMPLANT
SUT VICRYL 4-0 PS2 18IN ABS (SUTURE) ×3 IMPLANT
TOWEL GREEN STERILE (TOWEL DISPOSABLE) ×3 IMPLANT
UNDERPAD 30X36 HEAVY ABSORB (UNDERPADS AND DIAPERS) ×3 IMPLANT
WATER STERILE IRR 1000ML POUR (IV SOLUTION) ×3 IMPLANT

## 2019-09-10 NOTE — Transfer of Care (Signed)
Immediate Anesthesia Transfer of Care Note  Patient: Jesse Murphy  Procedure(s) Performed: RIGHT ARM 1ST STAGE BASCILIC VEIN TRANSPOSITION (Right Arm Upper)  Patient Location: PACU  Anesthesia Type:MAC  Level of Consciousness: awake, alert  and oriented  Airway & Oxygen Therapy: Patient Spontanous Breathing  Post-op Assessment: Report given to RN  Post vital signs: Reviewed and stable  Last Vitals:  Vitals Value Taken Time  BP 118/56 09/10/19 1113  Temp    Pulse 80 09/10/19 1114  Resp 13 09/10/19 1114  SpO2 100 % 09/10/19 1114  Vitals shown include unvalidated device data.  Last Pain:  Vitals:   09/10/19 0803  TempSrc:   PainSc: 0-No pain      Patients Stated Pain Goal: 4 (42/39/53 2023)  Complications: No complications documented.

## 2019-09-10 NOTE — Anesthesia Postprocedure Evaluation (Signed)
Anesthesia Post Note  Patient: Jesse Murphy  Procedure(s) Performed: RIGHT ARM 1ST STAGE BASCILIC VEIN TRANSPOSITION (Right Arm Upper)     Patient location during evaluation: PACU Anesthesia Type: MAC Level of consciousness: awake Pain management: pain level controlled Vital Signs Assessment: post-procedure vital signs reviewed and stable Respiratory status: spontaneous breathing Cardiovascular status: stable Postop Assessment: no apparent nausea or vomiting Anesthetic complications: no   No complications documented.  Last Vitals:  Vitals:   09/10/19 1115 09/10/19 1130  BP: (!) 118/56 (!) 117/54  Pulse: 80 74  Resp: 13 12  Temp: 36.8 C (!) 36.1 C  SpO2: 100% 98%    Last Pain:  Vitals:   09/10/19 1130  TempSrc:   PainSc: 0-No pain                 Jourdin Connors

## 2019-09-10 NOTE — Interval H&P Note (Signed)
History and Physical Interval Note:  09/10/2019 9:23 AM  Jesse Murphy  has presented today for surgery, with the diagnosis of END STAGE RENAL DISEASE.  The various methods of treatment have been discussed with the patient and family. After consideration of risks, benefits and other options for treatment, the patient has consented to  Procedure(s): RIGHT ARM 1ST STAGE [place] VERSES ARTERIOVENOUS GRAFT (Right) as a surgical intervention.  The patient's history has been reviewed, patient examined, no change in status, stable for surgery.  I have reviewed the patient's chart and labs.  Questions were answered to the patient's satisfaction.     [name]

## 2019-09-10 NOTE — Interval H&P Note (Signed)
History and Physical Interval Note:  09/10/2019 9:24 AM  Jesse Murphy  has presented today for surgery, with the diagnosis of END STAGE RENAL DISEASE.  The various methods of treatment have been discussed with the patient and family. After consideration of risks, benefits and other options for treatment, the patient has consented to  Procedure(s): RIGHT ARM 1ST STAGE [place] VERSES ARTERIOVENOUS GRAFT (Right) as a surgical intervention.  The patient's history has been reviewed, patient examined, no change in status, stable for surgery.  I have reviewed the patient's chart and labs.  Questions were answered to the patient's satisfaction.     [name]

## 2019-09-10 NOTE — Op Note (Signed)
    NAME: Jesse Murphy    MRN: [id number] DOB: January 22, 1929    DATE OF OPERATION: 09/10/2019  PREOP DIAGNOSIS:    End-stage renal disease  POSTOP DIAGNOSIS:    End-stage renal disease  PROCEDURE:    For stage right basilic vein transposition  SURGEON: Judeth Cornfield. Scot Dock, MD  ASSIST: Gaye Alken, RNFA  ANESTHESIA: Local with sedation  EBL: Minimal  INDICATIONS:    [name] is a 86 y.o. male who presents for new access.  FINDINGS:   3.5 mm basilic vein  TECHNIQUE:   Cath the patient was taken to the operating room and sedated by anesthesia.  The right arm was prepped and draped in usual sterile fashion.  After the skin was infiltrated with 1% lidocaine an incision was made over the basilic vein just above the antecubital level.  Here the vein was dissected free and it was about a 3.5 mm vein.  I felt it was adequate for a for stage basilic vein transposition.  The brachial artery was dissected free beneath the fascia.  The patient was bolused with Angiomax.  Once this had circulated the brachial artery was clamped proximally and distally and a longitudinal arteriotomy was made.  The vein was ligated distally and slightly spatulated and sewn end-to-side to the artery using continuous 6-0 Prolene suture.  Slight redundancy was left on the vein in anticipation of second stage basilic vein transposition.  Hemostasis was obtained in the wound.  The wound was then closed with a deep layer of 3-0 Vicryl and the skin closed with 4-0 Vicryl.  Dermabond was applied.  The patient tolerated the procedure well and was transferred to the recovery room in stable condition.  All needle and sponge counts were correct.  Given the complexity of the case a first assistant was necessary in order to expedient the procedure and safely perform the technical aspects of the operation.  [name], MD, FACS [facility]  DATE OF DICTATION:   09/10/2019

## 2019-09-11 ENCOUNTER — Encounter (HOSPITAL_COMMUNITY): Payer: Self-pay | Admitting: Vascular Surgery

## 2019-09-12 ENCOUNTER — Other Ambulatory Visit: Payer: Self-pay | Admitting: *Deleted

## 2019-09-12 DIAGNOSIS — I9581 Postprocedural hypotension: Secondary | ICD-10-CM

## 2019-09-12 MED ORDER — MIDODRINE HCL 10 MG PO TABS: 10.0000 mg | ORAL_TABLET | Freq: Two times a day (BID) | ORAL | 1 refills | Status: DC

## 2019-09-16 DIAGNOSIS — L100 Pemphigus vulgaris: Secondary | ICD-10-CM

## 2019-09-16 HISTORY — DX: Pemphigus vulgaris: L10.0

## 2019-09-19 DIAGNOSIS — E039 Hypothyroidism, unspecified: Secondary | ICD-10-CM | POA: Insufficient documentation

## 2019-09-24 ENCOUNTER — Encounter: Payer: Medicare Other | Attending: Physical Medicine & Rehabilitation | Admitting: Physical Medicine & Rehabilitation

## 2019-09-24 ENCOUNTER — Other Ambulatory Visit: Payer: Self-pay

## 2019-09-24 ENCOUNTER — Encounter: Payer: Self-pay | Admitting: Physical Medicine & Rehabilitation

## 2019-09-24 VITALS — BP 122/54 | Temp 101.0°F | Ht 68.0 in | Wt 155.0 lb

## 2019-09-24 DIAGNOSIS — R5381 Other malaise: Secondary | ICD-10-CM | POA: Diagnosis present

## 2019-09-24 DIAGNOSIS — Z992 Dependence on renal dialysis: Secondary | ICD-10-CM | POA: Insufficient documentation

## 2019-09-24 DIAGNOSIS — N179 Acute kidney failure, unspecified: Secondary | ICD-10-CM | POA: Insufficient documentation

## 2019-09-24 DIAGNOSIS — I5021 Acute systolic (congestive) heart failure: Secondary | ICD-10-CM | POA: Insufficient documentation

## 2019-09-24 DIAGNOSIS — I2510 Atherosclerotic heart disease of native coronary artery without angina pectoris: Secondary | ICD-10-CM

## 2019-09-24 DIAGNOSIS — R319 Hematuria, unspecified: Secondary | ICD-10-CM | POA: Insufficient documentation

## 2019-09-24 DIAGNOSIS — R269 Unspecified abnormalities of gait and mobility: Secondary | ICD-10-CM | POA: Diagnosis present

## 2019-09-24 DIAGNOSIS — N186 End stage renal disease: Secondary | ICD-10-CM | POA: Diagnosis present

## 2019-09-24 NOTE — Progress Notes (Signed)
Subjective:    Patient ID: Jesse Murphy, male    DOB: 24-May-1928, 86 y.o.   MRN: [id number]  HPI  Right-handed male with history of CAD, chronic atrial fibrillation on Xarelto as well as non-Hodgkin's lymphoma, chronic anemia, hypertension, diastolic congestive heart failure, prostate cancer presents for follow-up for multifactorial debility.  Son supplements history. Last clinic visit on 07/09/19.  Since that time, pt saw Vascular for access staging. He completed therapies. He had a fall trying to reach something on the floor and fell off his bed. He has increased ambulation some. He is trying to gain weight. Denies hematuria and GI bleed.   Pain Inventory Average Pain 0 Pain Right Now 0 My pain is no pain  In the last 24 hours, has pain interfered with the following? General activity 0 Relation with others 0 Enjoyment of life 0 What TIME of day is your pain at its worst? no pain Sleep (in general) Good  Pain is worse with: no pain Pain improves with: no pain Relief from Meds: no pain  Mobility walk with assistance use a walker use a wheelchair transfers alone  Function not employed: date last employed . I need assistance with the following:  dressing, bathing, toileting, meal prep, household duties and shopping  Neuro/Psych bladder control problems trouble walking  Prior Studies Any changes since last visit?  no  Physicians involved in your care Any changes since last visit?  no   Family History  Problem Relation Age of Onset  . CAD Mother 33  . CAD Father 47  . CAD Sister   . CAD Brother    Social History   Socioeconomic History  . Marital status: Married    Spouse name: Not on file  . Number of children: 2  . Years of education: Not on file  . Highest education level: Not on file  Occupational History  . Occupation: Librarian, academic in supply    Comment: [employer] [place]  Tobacco Use  . Smoking status: Former Smoker    Packs/day: 0.25    Years: 50.00     Pack years: 12.50    Types: Cigarettes    Quit date: 02/15/1990    Years since quitting: 29.6  . Smokeless tobacco: Never Used  Vaping Use  . Vaping Use: Never used  Substance and Sexual Activity  . Alcohol use: No  . Drug use: No  . Sexual activity: Not Currently  Other Topics Concern  . Not on file  Social History Narrative  . Not on file   Social Determinants of Health   Financial Resource Strain:   . Difficulty of Paying Living Expenses:   Food Insecurity:   . Worried About Charity fundraiser in the Last Year:   . Arboriculturist in the Last Year:   Transportation Needs:   . Film/video editor (Medical):   Marland Kitchen Lack of Transportation (Non-Medical):   Physical Activity:   . Days of Exercise per Week:   . Minutes of Exercise per Session:   Stress:   . Feeling of Stress :   Social Connections:   . Frequency of Communication with Friends and Family:   . Frequency of Social Gatherings with Friends and Family:   . Attends Religious Services:   . Active Member of Clubs or Organizations:   . Attends Archivist Meetings:   Marland Kitchen Marital Status:    Past Surgical History:  Procedure Laterality Date  . BASCILIC VEIN TRANSPOSITION Right 09/10/2019  Procedure: RIGHT ARM 1ST STAGE BASCILIC VEIN TRANSPOSITION;  Surgeon: Angelia Mould, MD;  Location: [facility];  Service: Vascular;  Laterality: Right;  . CHEST TUBE INSERTION Left 04/23/2019   Procedure: INSERTION PLEURAL DRAINAGE CATHETER TO DRAIN LEFT PLEURAL EFFUSION;  Surgeon: Ivin Poot, MD;  Location: [facility];  Service: Thoracic;  Laterality: Left;  . CLIPPING OF ATRIAL APPENDAGE N/A 03/16/2019   Procedure: CLIPPING OF LEFT  ATRIAL APPENDAGE - USING ATRICLIP SIZE 40;  Surgeon: Ivin Poot, MD;  Location: [facility];  Service: Open Heart Surgery;  Laterality: N/A;  . COLONOSCOPY N/A 08/25/2017   Procedure: COLONOSCOPY;  Surgeon: Rogene Houston, MD;  [facility];  Service: Endoscopy;  Laterality: N/A;  .  CORONARY ARTERY BYPASS GRAFT N/A 03/16/2019   Procedure: CORONARY ARTERY BYPASS GRAFTING (CABG), ON PUMP, TIMES THREE, USING LEFT INTERNAL MAMMARY ARTERY, RIGHT GREATER SAPHENOUS VEIN HARVESTED ENDOSCOPICALLY;  Surgeon: Ivin Poot, MD;  Location: [facility];  Service: Open Heart Surgery;  Laterality: N/A;  swan only  . CYSTOSCOPY WITH FULGERATION N/A 04/30/2019   Procedure: CYSTOSCOPY WITH FULGERATION;  Surgeon: Irine Seal, MD;  Location: [facility];  Service: Urology;  Laterality: N/A;  . HERNIA REPAIR    . IR FLUORO GUIDE CV LINE LEFT  04/23/2019  . IR FLUORO GUIDE CV LINE RIGHT  05/24/2019  . IR US GUIDE VASC ACCESS LEFT  04/23/2019  . MAZE N/A 03/16/2019   Procedure: MAZE;  Surgeon: Ivin Poot, MD;  Location: [facility];  Service: Open Heart Surgery;  Laterality: N/A;  . PLACEMENT OF IMPELLA LEFT VENTRICULAR ASSIST DEVICE Right 03/27/2019   Procedure: Placement Of Impella Left Ventricular Assist Device using ABIOMED Impella 5.5 with SmartAssist Device.;  Surgeon: Wonda Olds, MD;  [facility];  Service: Thoracic;  Laterality: Right;  . PROSTATE SURGERY    . REMOVAL OF IMPELLA LEFT VENTRICULAR ASSIST DEVICE N/A 04/10/2019   Procedure: REMOVAL OF IMPELLA LEFT VENTRICULAR ASSIST DEVICE WITH INSERTION OF RIGHT FEMORAL ARTERIAL LINE;  Surgeon: Ivin Poot, MD;  Location: [facility];  Service: Open Heart Surgery;  Laterality: N/A;  . REMOVAL OF PLEURAL DRAINAGE CATHETER Left 07/02/2019   Procedure: REMOVAL OF PLEURAL DRAINAGE CATHETER;  Surgeon: Ivin Poot, MD;  Location: [facility];  Service: Thoracic;  Laterality: Left;  . RIGHT/LEFT HEART CATH AND CORONARY ANGIOGRAPHY N/A 03/15/2019   Procedure: RIGHT/LEFT HEART CATH AND CORONARY ANGIOGRAPHY;  Surgeon: Burnell Blanks, MD;  Location: [facility];  Service: Cardiovascular;  Laterality: N/A;  . TEE WITHOUT CARDIOVERSION N/A 03/16/2019   Procedure: TRANSESOPHAGEAL ECHOCARDIOGRAM (TEE);  Surgeon: Prescott Gum, Collier Salina, MD;  Location: [facility];   Service: Open Heart Surgery;  Laterality: N/A;  . TRACHEOSTOMY TUBE PLACEMENT N/A 03/27/2019   Procedure: TRACHEOSTOMY placed using Shiley 8DCT Cuffed.;  Surgeon: Wonda Olds, MD;  [facility];  Service: Thoracic;  Laterality: N/A;  . TRANSURETHRAL RESECTION OF BLADDER NECK N/A 05/13/2019   Procedure: CYSTOSCOPY CLOT EVACUATION FULGRATION CYSTOGRAM AND INSTILLATION OF FORMALIN;  Surgeon: Lucas Mallow, MD;  Location: [facility];  Service: Urology;  Laterality: N/A;   Past Medical History:  Diagnosis Date  . Anemia   . CAD (coronary artery disease)    a. s/p PCI in 1992 b. Coronary CT in 01/2018 showing extensive coronary calcification; FFR indeterminate --> medical management pursued at that time as patient not interested in repeat cath. c. s/p CABGx3 in 02/2019.  . Cancer ([place])  prostate  . Cardiogenic shock ([place])   . CHB (complete heart block) (H[facility])    a. due to vagal event in 2021, requiring temp pacer.  . Chronic combined systolic and diastolic CHF (congestive heart failure) ([place])   . Debilitated   . Dyspnea   . End stage renal disease ([place])   . Esophagitis   . H/O tracheostomy    a. during admit in 2021 for respiratory failure.  . Hematuria   . Hypertension   . Hypoalbuminemia   . Ischemic cardiomyopathy   . Myocardial infarction ([place])   . PAF (paroxysmal atrial fibrillation) ([place])   . Pleural effusion, left    a. s/p pleurX catheter  . Pseudomonas pneumonia ([place])   . Thrombocytopenia (H[facility])    BP (!) 122/54   Temp (!) 101 F (38.3 C)   Ht 5\' 8"  (1.727 m)   Wt 155 lb (70.3 kg)   SpO2 98%   BMI 23.57 kg/m   Opioid Risk Score:   Fall Risk Score:  `1  Depression screen PHQ 2/9  Depression screen PHQ 2/9 07/09/2019  Decreased Interest 0  Down, Depressed, Hopeless 0  PHQ - 2 Score 0  Altered sleeping 0  Tired, decreased energy 1  Change in appetite 1  Feeling bad or failure about yourself  0  Trouble concentrating 0  Moving slowly or fidgety/restless 0   Suicidal thoughts 0  PHQ-9 Score 2  Difficult doing work/chores Not difficult at all    Review of Systems  Constitutional: Negative.  Negative for diaphoresis.  HENT: Negative.   Eyes: Negative.   Respiratory: Negative.   Cardiovascular: Negative.   Gastrointestinal: Negative.   Endocrine: Negative.   Genitourinary: Negative.   Musculoskeletal: Positive for gait problem.  Skin: Negative.   Allergic/Immunologic: Negative.   Hematological: Negative.   Psychiatric/Behavioral: Negative.   All other systems reviewed and are negative.      Objective:   Physical Exam Constitutional: NAD. Vital signs reviewed. HENT: Normocephalic.  Atraumatic. Respiratory: Normal effort.  No stridor.   GI: Non-distended. Skin: Warm and dry.  Intact. Psych: Normal mood. Normal affect. Musc: LE edema, unchanged Neurologic: Alert Motor: 4/5 B/l UE B/l LE: HF 4-/5, distally 4/5    Assessment & Plan:  Right-handed male with history of CAD, chronic atrial fibrillation on Xarelto as well as non-Hodgkin's lymphoma, chronic anemia, hypertension, diastolic congestive heart failure, prostate cancer presents for follow-up for multifactorial debility.  1.  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  Impella device removed 04/10/2019.  Sternal precautions             Cont HEP - not doing much per son  Referral to PT  Cont follow up with Vascular  2.  Acute systolic congestive heart failure/cardiogenic shock.    Continue follow up with Cards  3. 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    Continue follow up with Nephro  4. Gait abnormality  Encouraged HEP

## 2019-10-01 ENCOUNTER — Other Ambulatory Visit: Payer: Self-pay

## 2019-10-01 ENCOUNTER — Inpatient Hospital Stay (HOSPITAL_COMMUNITY)
Admission: EM | Admit: 2019-10-01 | Discharge: 2019-10-03 | DRG: 595 | Disposition: A | Discharge status: Other Acute Inpatient Hospital | Payer: Medicare Other | Attending: Internal Medicine | Admitting: Internal Medicine

## 2019-10-01 ENCOUNTER — Encounter (HOSPITAL_COMMUNITY): Payer: Self-pay | Admitting: *Deleted

## 2019-10-01 DIAGNOSIS — R627 Adult failure to thrive: Secondary | ICD-10-CM | POA: Diagnosis not present

## 2019-10-01 DIAGNOSIS — R579 Shock, unspecified: Secondary | ICD-10-CM | POA: Diagnosis present

## 2019-10-01 DIAGNOSIS — I480 Paroxysmal atrial fibrillation: Secondary | ICD-10-CM | POA: Diagnosis present

## 2019-10-01 DIAGNOSIS — M351 Other overlap syndromes: Secondary | ICD-10-CM | POA: Diagnosis present

## 2019-10-01 DIAGNOSIS — Z923 Personal history of irradiation: Secondary | ICD-10-CM

## 2019-10-01 DIAGNOSIS — I2510 Atherosclerotic heart disease of native coronary artery without angina pectoris: Secondary | ICD-10-CM | POA: Diagnosis present

## 2019-10-01 DIAGNOSIS — I252 Old myocardial infarction: Secondary | ICD-10-CM

## 2019-10-01 DIAGNOSIS — L512 Toxic epidermal necrolysis [Lyell]: Principal | ICD-10-CM | POA: Diagnosis present

## 2019-10-01 DIAGNOSIS — L511 Stevens-Johnson syndrome: Secondary | ICD-10-CM | POA: Diagnosis present

## 2019-10-01 DIAGNOSIS — I132 Hypertensive heart and chronic kidney disease with heart failure and with stage 5 chronic kidney disease, or end stage renal disease: Secondary | ICD-10-CM | POA: Diagnosis present

## 2019-10-01 DIAGNOSIS — Z79891 Long term (current) use of opiate analgesic: Secondary | ICD-10-CM

## 2019-10-01 DIAGNOSIS — N186 End stage renal disease: Secondary | ICD-10-CM | POA: Diagnosis not present

## 2019-10-01 DIAGNOSIS — Z87891 Personal history of nicotine dependence: Secondary | ICD-10-CM

## 2019-10-01 DIAGNOSIS — Z7982 Long term (current) use of aspirin: Secondary | ICD-10-CM

## 2019-10-01 DIAGNOSIS — Z951 Presence of aortocoronary bypass graft: Secondary | ICD-10-CM

## 2019-10-01 DIAGNOSIS — Z992 Dependence on renal dialysis: Secondary | ICD-10-CM

## 2019-10-01 DIAGNOSIS — T50905A Adverse effect of unspecified drugs, medicaments and biological substances, initial encounter: Secondary | ICD-10-CM | POA: Diagnosis present

## 2019-10-01 DIAGNOSIS — Z8679 Personal history of other diseases of the circulatory system: Secondary | ICD-10-CM

## 2019-10-01 DIAGNOSIS — Z9861 Coronary angioplasty status: Secondary | ICD-10-CM

## 2019-10-01 DIAGNOSIS — I5032 Chronic diastolic (congestive) heart failure: Secondary | ICD-10-CM | POA: Diagnosis present

## 2019-10-01 DIAGNOSIS — Z888 Allergy status to other drugs, medicaments and biological substances status: Secondary | ICD-10-CM

## 2019-10-01 DIAGNOSIS — I255 Ischemic cardiomyopathy: Secondary | ICD-10-CM | POA: Diagnosis present

## 2019-10-01 DIAGNOSIS — D649 Anemia, unspecified: Secondary | ICD-10-CM | POA: Diagnosis present

## 2019-10-01 DIAGNOSIS — H53149 Visual discomfort, unspecified: Secondary | ICD-10-CM | POA: Diagnosis present

## 2019-10-01 DIAGNOSIS — I5042 Chronic combined systolic (congestive) and diastolic (congestive) heart failure: Secondary | ICD-10-CM | POA: Diagnosis present

## 2019-10-01 DIAGNOSIS — Z8572 Personal history of non-Hodgkin lymphomas: Secondary | ICD-10-CM

## 2019-10-01 DIAGNOSIS — Z20822 Contact with and (suspected) exposure to covid-19: Secondary | ICD-10-CM | POA: Diagnosis present

## 2019-10-01 DIAGNOSIS — R21 Rash and other nonspecific skin eruption: Secondary | ICD-10-CM | POA: Diagnosis not present

## 2019-10-01 DIAGNOSIS — I442 Atrioventricular block, complete: Secondary | ICD-10-CM | POA: Diagnosis present

## 2019-10-01 DIAGNOSIS — Z8249 Family history of ischemic heart disease and other diseases of the circulatory system: Secondary | ICD-10-CM

## 2019-10-01 DIAGNOSIS — Z79899 Other long term (current) drug therapy: Secondary | ICD-10-CM

## 2019-10-01 DIAGNOSIS — L109 Pemphigus, unspecified: Secondary | ICD-10-CM

## 2019-10-01 DIAGNOSIS — Z8546 Personal history of malignant neoplasm of prostate: Secondary | ICD-10-CM

## 2019-10-01 DIAGNOSIS — L513 Stevens-Johnson syndrome-toxic epidermal necrolysis overlap syndrome: Secondary | ICD-10-CM | POA: Diagnosis present

## 2019-10-01 DIAGNOSIS — J151 Pneumonia due to Pseudomonas: Secondary | ICD-10-CM | POA: Diagnosis present

## 2019-10-01 LAB — COMPREHENSIVE METABOLIC PANEL
ALT: 13 U/L (ref 0–44)
AST: 20 U/L (ref 15–41)
Albumin: 2.1 g/dL — ABNORMAL LOW (ref 3.5–5.0)
Alkaline Phosphatase: 94 U/L (ref 38–126)
Anion gap: 12 (ref 5–15)
BUN: 24 mg/dL — ABNORMAL HIGH (ref 8–23)
CO2: 30 mmol/L (ref 22–32)
Calcium: 8 mg/dL — ABNORMAL LOW (ref 8.9–10.3)
Chloride: 97 mmol/L — ABNORMAL LOW (ref 98–111)
Creatinine, Ser: 4.02 mg/dL — ABNORMAL HIGH (ref 0.61–1.24)
GFR calc Af Amer: 14 mL/min — ABNORMAL LOW (ref >60)
GFR calc non Af Amer: 12 mL/min — ABNORMAL LOW (ref >60)
Glucose, Bld: 107 mg/dL — ABNORMAL HIGH (ref 70–99)
Potassium: 3.6 mmol/L (ref 3.5–5.1)
Sodium: 139 mmol/L (ref 135–145)
Total Bilirubin: 0.7 mg/dL (ref 0.3–1.2)
Total Protein: 5.3 g/dL — ABNORMAL LOW (ref 6.5–8.1)

## 2019-10-01 LAB — CBC WITH DIFFERENTIAL/PLATELET
Abs Immature Granulocytes: 0.03 10*3/uL (ref 0.00–0.07)
Basophils Absolute: 0 10*3/uL (ref 0.0–0.1)
Basophils Relative: 0 %
Eosinophils Absolute: 0.3 10*3/uL (ref 0.0–0.5)
Eosinophils Relative: 6 %
HCT: 38.1 % — ABNORMAL LOW (ref 39.0–52.0)
Hemoglobin: 11.6 g/dL — ABNORMAL LOW (ref 13.0–17.0)
Immature Granulocytes: 1 %
Lymphocytes Relative: 10 %
Lymphs Abs: 0.6 10*3/uL — ABNORMAL LOW (ref 0.7–4.0)
MCH: 32.4 pg (ref 26.0–34.0)
MCHC: 30.4 g/dL (ref 30.0–36.0)
MCV: 106.4 fL — ABNORMAL HIGH (ref 80.0–100.0)
Monocytes Absolute: 0.4 10*3/uL (ref 0.1–1.0)
Monocytes Relative: 8 %
Neutro Abs: 4.2 10*3/uL (ref 1.7–7.7)
Neutrophils Relative %: 75 %
Platelets: 186 10*3/uL (ref 150–400)
RBC: 3.58 MIL/uL — ABNORMAL LOW (ref 4.22–5.81)
RDW: 19.4 % — ABNORMAL HIGH (ref 11.5–15.5)
WBC: 5.7 10*3/uL (ref 4.0–10.5)
nRBC: 0 % (ref 0.0–0.2)

## 2019-10-01 LAB — C-REACTIVE PROTEIN: CRP: 9.9 mg/dL — ABNORMAL HIGH (ref <1.0)

## 2019-10-01 LAB — SEDIMENTATION RATE: Sed Rate: 25 mm/hr — ABNORMAL HIGH (ref 0–16)

## 2019-10-01 LAB — PROTIME-INR
INR: 1 (ref 0.8–1.2)
Prothrombin Time: 12.7 seconds (ref 11.4–15.2)

## 2019-10-01 LAB — APTT: aPTT: 33 seconds (ref 24–36)

## 2019-10-01 LAB — SARS CORONAVIRUS 2 BY RT PCR (HOSPITAL ORDER, PERFORMED IN ~~LOC~~ HOSPITAL LAB): SARS Coronavirus 2: NEGATIVE

## 2019-10-01 MED ORDER — SODIUM CHLORIDE 0.9 % IV SOLN: 250.0000 mL | INTRAVENOUS | Status: DC | PRN

## 2019-10-01 MED ORDER — FENTANYL CITRATE (PF) 100 MCG/2ML IJ SOLN
50.0000 ug | INTRAMUSCULAR | Status: DC | PRN
  Administered 2019-10-01: 50 ug via INTRAVENOUS
  Filled 2019-10-01: qty 2

## 2019-10-01 MED ORDER — RENA-VITE PO TABS
1.0000 | ORAL_TABLET | Freq: Every day | ORAL | Status: DC
  Administered 2019-10-01 – 2019-10-02 (×2): 1 via ORAL
  Filled 2019-10-01 (×4): qty 1

## 2019-10-01 MED ORDER — SILVER SULFADIAZINE 1 % EX CREA
TOPICAL_CREAM | Freq: Two times a day (BID) | CUTANEOUS | Status: DC
  Filled 2019-10-01: qty 50
  Filled 2019-10-01: qty 85

## 2019-10-01 MED ORDER — ONDANSETRON HCL 4 MG/2ML IJ SOLN: 4.0000 mg | Freq: Four times a day (QID) | INTRAMUSCULAR | Status: DC | PRN

## 2019-10-01 MED ORDER — ACETAMINOPHEN 325 MG PO TABS
650.0000 mg | ORAL_TABLET | Freq: Four times a day (QID) | ORAL | Status: DC | PRN
  Administered 2019-10-03: 650 mg via ORAL
  Filled 2019-10-01: qty 2

## 2019-10-01 MED ORDER — BISACODYL 10 MG RE SUPP: 10.0000 mg | Freq: Every day | RECTAL | Status: DC | PRN

## 2019-10-01 MED ORDER — SILVER SULFADIAZINE 1 % EX CREA
TOPICAL_CREAM | Freq: Once | CUTANEOUS | Status: AC
  Administered 2019-10-01: 1 via TOPICAL
  Filled 2019-10-01: qty 50

## 2019-10-01 MED ORDER — POLYETHYLENE GLYCOL 3350 17 G PO PACK: 17.0000 g | PACK | Freq: Every day | ORAL | Status: DC | PRN

## 2019-10-01 MED ORDER — ASPIRIN 81 MG PO CHEW
81.0000 mg | CHEWABLE_TABLET | Freq: Every day | ORAL | Status: DC
  Administered 2019-10-01 – 2019-10-02 (×2): 81 mg via ORAL
  Filled 2019-10-01 (×2): qty 1

## 2019-10-01 MED ORDER — SODIUM CHLORIDE 0.9% FLUSH
3.0000 mL | Freq: Two times a day (BID) | INTRAVENOUS | Status: DC
  Administered 2019-10-01 – 2019-10-02 (×3): 3 mL via INTRAVENOUS

## 2019-10-01 MED ORDER — ONDANSETRON HCL 4 MG PO TABS: 4.0000 mg | ORAL_TABLET | Freq: Four times a day (QID) | ORAL | Status: DC | PRN

## 2019-10-01 MED ORDER — SODIUM CHLORIDE 0.9% FLUSH: 3.0000 mL | INTRAVENOUS | Status: DC | PRN

## 2019-10-01 MED ORDER — OXYCODONE HCL 5 MG PO TABS
5.0000 mg | ORAL_TABLET | ORAL | Status: DC | PRN
  Administered 2019-10-01: 5 mg via ORAL
  Filled 2019-10-01: qty 1

## 2019-10-01 MED ORDER — FAMOTIDINE 20 MG PO TABS
10.0000 mg | ORAL_TABLET | Freq: Every day | ORAL | Status: DC
  Administered 2019-10-01 – 2019-10-02 (×2): 10 mg via ORAL
  Filled 2019-10-01 (×2): qty 1

## 2019-10-01 MED ORDER — FENTANYL CITRATE (PF) 100 MCG/2ML IJ SOLN
50.0000 ug | INTRAMUSCULAR | Status: DC | PRN
  Administered 2019-10-02 (×2): 50 ug via INTRAVENOUS
  Filled 2019-10-01 (×2): qty 2

## 2019-10-01 NOTE — ED Provider Notes (Signed)
[facility] has patient on stand-by for admission. [facility] and [facility] has declined. [facility] will be happy to give a follow up appointment - see my ED note for the info on where to call.  [facility] team was able to provide [place] with a plan. It is as following:  Dermatology Telephone Plan of Care Note   S:  86 year old male who presented to OSH ED with a rash that started the beginning of August.   Prior to developing the rash, patient started midodrine on 09/12/2019.  Patient started develop a rash on his body on September 18, 2019 that began as a quarter sized blister and slowly spread to include his trunk and extremities.  Due to the spread of this rash, patient was advised to stop midodrine, which he did on approximately September 18, 2019 as well.   Patient received the COVID-19 vaccination Wynetta Emery & [employer]etta Emery) on 09/28/2019 and noticed a worsening of his rash following reception of the vaccine.  Patient has numerous areas of skin sloughing covering about 20 to 25% BSA, but has no mucosal involvement including no ocular, oral, anal mucosal involvement.  He does have light sensitivity and some perirectal lesions.   O: Vitals stable per OSH ED provider Examination based on images sent securely -Desquamation of skin with large erythematous patches on the chest and head (and per report on the gluteal region as well)   Assessment:  86 year old male who presented to OSH ED with a rash that started the beginning of August.  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  It would be difficult to tell without further biopsy and workup.  Regardless, given the extent of BSA it would be reasonable to treat empirically if patient is getting worse.   One would presume that patient initially had an adverse drug reaction to Midodrine that subsequently improved and then flared following J&J vaccination.  J&J vaccination may have acted to  enhance an already overactive immune system and led to worsening of patient's cutaneous disease.   Plan: 1. Will defer consideration for admission to hospitalist team.  May consider admission at [facility] given denuded skin with concern for SJS and [facility] has a burn center.   2. If patient is acutely worsening or it appears that transfer is not an option, consider starting Prednisone (IV or PO) at 70 mg daily (1 mg/kg/day) for treatment of pemphigus vulgaris (not a lot of evidence for use of systemic steroids in treatment of SJS/TEN)   3. SCORETEN can be used to track progression   4. Recommend routine supportive care: - Labs/monitoring: CBC, CMP, monitor ins/outs and electrolytes closely, resuscitate fluids as needed - pain relief per primary team - hydroxyzine can be used for symptomatic treatment of pruritus   5. Would have a low index of suspicion to draw cultures due to the increased risk of infection in SJS/TEN - we do not recommend prophylactic antibiotics   6. Treatment of intact skin only - Treat with Triamcinolone 0.1% ointment BID to the body only to areas of intact skin   7. Wound care for erosions and blisters - Do not to remove the dead skin; leave the blister roof as a "biological dressing" - Manipulate skin as little as possible, avoid tape/bandages -  daily wound care with sterile saline and apply mupirocin ointment. This can be covered with vaseline gauze. Alternatively, non-adherent nanocrystalline gauze materials containing silver ions can be  used. -  Vaseline for face and lips twice daily   Varney Biles, MD 10/01/19 [id number]

## 2019-10-01 NOTE — ED Notes (Addendum)
Applied silvadene cream to generalized blister sites noted to pt chest, bilateral axilla, right buttock and left posterior thigh. Secured loosely with paper tape. Pt tolerated well. Pt denies any other areas of pain at this time.

## 2019-10-01 NOTE — ED Triage Notes (Signed)
Patient states peeling skin is possible reaction to some of his medication

## 2019-10-01 NOTE — H&P (Addendum)
[facility] History & Physical  Kelly Splinter MD  Jesse Murphy 10/01/2019  Chief Complaint:  HPI: The patient is a 86 y.o. year-old w/ hx of PAF, PNA, CAD/ MI/ ICM / hx CABG jan 2021, HTN, hx of trach, ESRD on HD, combined s/d CHF, CHB, prostate cancer presented w/ 10-12 day hx of skin rash w/ skin peeling off if various areas. He was started on midodrine a few days before the rash started but the rash has not improved off of midodrine for around 1 week now. ED MD called [facility] and they are not taking any patients today , this per the ED physician here , Dr Kathrynn Humble. Dr Kathrynn Humble also called [facility], they also declined transfer. [facility] have offered to set up a clinic appt , see below. Asked to admit pt for observation and until an appt can be made and pt get's his HD session and then possibly can be dc'd home tomorrow.   Pt seen in room w/ his son.  States he was started on midodrine about 12 days ago, he noticed red spots on his abdomen a few days later over the weekend and stopped the midodrine after about 2-3 days of taking it.  About 7 days ago the lesions got worse on his scalp, face and torso.  These lesions are not healing up somewhat, the newest lesions per the patient are now in the posterior thighs and perineal area.  Has not had any visual difficulties, does has some matting of the eyelids upon awakening in the morning. No voiding difficulties, no diarrhea or constiopation or bloody stool, no n/v.  No fevers or chills, no prod cough or SOB. Has not missed and HD, next due tomorrow.    He had L chest TDC for HD and maturing R upper arm AVF.  The [facility] has had to be changed out "a few times" since started HD in March 2021.  ESRD they think was related to the problems he had surrounding his CABG in jan 2021.   Jan- March 2021 > pt admitted for afib RVR w/ chest and arm pain, had DCCV, EF 30-35% and was found at cath to have severe L main and 3 vessel disease.  He underwent CABG w/ multiple  complications mainly advanced heart faliure/ shock requiring pressors and Impella support. Had trach. Had CRRT and ended up on dialysis permanently. Had multiple transfusions. Feeding tube. Hematuria requiring CBI, GIB / melena.  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 Went to [facility]. Developed a rash after starting on Lipitor so this was stopped and he rec'd steroids.    ROS  denies CP  no joint pain   no HA  no blurry vision  no rash  no diarrhea  no nausea/ vomiting  no dysuria  no difficulty voiding  no change in urine color   Past Medical History  Past Medical History:  Diagnosis Date  . Anemia   . CAD (coronary artery disease)    a. s/p PCI in 1992 b. Coronary CT in 01/2018 showing extensive coronary calcification; FFR indeterminate --> medical management pursued at that time as patient not interested in repeat cath. c. s/p CABGx3 in 02/2019.  . Cancer [facility])    prostate  . Cardiogenic shock ([place])   . CHB (complete heart block) (HCC)    a. due to vagal event in 2021, requiring temp pacer.  Marland Kitchen  Chronic combined systolic and diastolic CHF (congestive heart failure) ([place])   . Debilitated   . Dyspnea   . End stage renal disease ([place])   . Esophagitis   . H/O tracheostomy    a. during admit in 2021 for respiratory failure.  . Hematuria   . Hypertension   . Hypoalbuminemia   . Ischemic cardiomyopathy   . Myocardial infarction ([place])   . PAF (paroxysmal atrial fibrillation) ([place])   . Pleural effusion, left    a. s/p pleurX catheter  . Pseudomonas pneumonia ([place])   . Thrombocytopenia [facility])    Past Surgical History  Past Surgical History:  Procedure Laterality Date  . BASCILIC VEIN TRANSPOSITION Right 09/10/2019   Procedure: RIGHT ARM 1ST STAGE BASCILIC VEIN TRANSPOSITION;  Surgeon: Angelia Mould, MD;  Location: [place];  Service: Vascular;  Laterality:  Right;  . CHEST TUBE INSERTION Left 04/23/2019   Procedure: INSERTION PLEURAL DRAINAGE CATHETER TO DRAIN LEFT PLEURAL EFFUSION;  Surgeon: Ivin Poot, MD;  Location: [place];  Service: Thoracic;  Laterality: Left;  . CLIPPING OF ATRIAL APPENDAGE N/A 03/16/2019   Procedure: CLIPPING OF LEFT  ATRIAL APPENDAGE - USING ATRICLIP SIZE 40;  Surgeon: Ivin Poot, MD;  Location: [place];  Service: Open Heart Surgery;  Laterality: N/A;  . COLONOSCOPY N/A 08/25/2017   Procedure: COLONOSCOPY;  Surgeon: Rogene Houston, MD;  [facility];  Service: Endoscopy;  Laterality: N/A;  . CORONARY ARTERY BYPASS GRAFT N/A 03/16/2019   Procedure: CORONARY ARTERY BYPASS GRAFTING (CABG), ON PUMP, TIMES THREE, USING LEFT INTERNAL MAMMARY ARTERY, RIGHT GREATER SAPHENOUS VEIN HARVESTED ENDOSCOPICALLY;  Surgeon: Ivin Poot, MD;  Location: [place];  Service: Open Heart Surgery;  Laterality: N/A;  swan only  . CYSTOSCOPY WITH FULGERATION N/A 04/30/2019   Procedure: CYSTOSCOPY WITH FULGERATION;  Surgeon: Irine Seal, MD;  Location: [place];  Service: Urology;  Laterality: N/A;  . HERNIA REPAIR    . IR FLUORO GUIDE CV LINE LEFT  04/23/2019  . IR FLUORO GUIDE CV LINE RIGHT  05/24/2019  . IR US GUIDE VASC ACCESS LEFT  04/23/2019  . MAZE N/A 03/16/2019   Procedure: MAZE;  Surgeon: Ivin Poot, MD;  Location: [place];  Service: Open Heart Surgery;  Laterality: N/A;  . PLACEMENT OF IMPELLA LEFT VENTRICULAR ASSIST DEVICE Right 03/27/2019   Procedure: Placement Of Impella Left Ventricular Assist Device using ABIOMED Impella 5.5 with SmartAssist Device.;  Surgeon: Wonda Olds, MD;  [facility]: [facility];  Service: Thoracic;  Laterality: Right;  . PROSTATE SURGERY    . REMOVAL OF IMPELLA LEFT VENTRICULAR ASSIST DEVICE N/A 04/10/2019   Procedure: REMOVAL OF IMPELLA LEFT VENTRICULAR ASSIST DEVICE WITH INSERTION OF RIGHT FEMORAL ARTERIAL LINE;  Surgeon: Ivin Poot, MD;  Location: [place];  Service: Open Heart Surgery;   Laterality: N/A;  . REMOVAL OF PLEURAL DRAINAGE CATHETER Left 07/02/2019   Procedure: REMOVAL OF PLEURAL DRAINAGE CATHETER;  Surgeon: Ivin Poot, MD;  Location: [place];  Service: Thoracic;  Laterality: Left;  . RIGHT/LEFT HEART CATH AND CORONARY ANGIOGRAPHY N/A 03/15/2019   Procedure: RIGHT/LEFT HEART CATH AND CORONARY ANGIOGRAPHY;  Surgeon: Burnell Blanks, MD;  Location: [place] [facility];  Service: Cardiovascular;  Laterality: N/A;  . TEE WITHOUT CARDIOVERSION N/A 03/16/2019   Procedure: TRANSESOPHAGEAL ECHOCARDIOGRAM (TEE);  Surgeon: Prescott Gum, Collier Salina, MD;  Location: [place];  Service: Open Heart Surgery;  Laterality: N/A;  . TRACHEOSTOMY TUBE PLACEMENT N/A 03/27/2019   Procedure: TRACHEOSTOMY  placed using Shiley 8DCT Cuffed.;  Surgeon: Wonda Olds, MD;  Location[facility];  Service: Thoracic;  Laterality: N/A;  . TRANSURETHRAL RESECTION OF BLADDER NECK N/A 05/13/2019   Procedure: CYSTOSCOPY CLOT EVACUATION FULGRATION CYSTOGRAM AND INSTILLATION OF FORMALIN;  Surgeon: Lucas Mallow, MD;  Location: [place];  Service: Urology;  Laterality: N/A;   Family History  Family History  Problem Relation Age of Onset  . CAD Mother 56  . CAD Father 69  . CAD Sister   . CAD Brother    Social History  reports that he quit smoking about 29 years ago. His smoking use included cigarettes. He has a 12.50 pack-year smoking history. He has never used smokeless tobacco. He reports that he does not drink alcohol and does not use drugs. Allergies  Allergies  Allergen Reactions  . Atorvastatin Anaphylaxis and Rash    Leg pain  . Amiodarone     Rash (see 05/2019 hospital notes), but was on other medications at the time so unclear which agent   . Heparin Other (See Comments)    Thrombocytopenia/ HIT  . Reglan [Metoclopramide]     Rash (see 05/2019 hospital notes), but was on other medications at the time so unclear which agent   Home medications Prior to Admission medications   Medication Sig  Start Date End Date Taking? Authorizing Provider  acetaminophen (TYLENOL) 325 MG tablet Take 650 mg by mouth every 6 (six) hours as needed for mild pain or headache.    [provider]  anticoagulant sodium citrate 4 GM/100ML SOLN 5 mLs by Intracatheter route every Monday, Wednesday, and Friday with hemodialysis. 05/21/19   Nani Skillern, PA-C  aspirin 81 MG chewable tablet Chew 1 tablet (81 mg total) by mouth daily. 05/21/19   Nani Skillern, PA-C  Darbepoetin Alfa (ARANESP) 150 MCG/0.3ML SOSY injection Inject 0.3 mLs (150 mcg total) into the vein every Wednesday with hemodialysis. 06/13/19   Angiulli, Lavon Paganini, PA-C  famotidine (PEPCID) 10 MG tablet Take 1 tablet (10 mg total) by mouth daily. 06/13/19   Angiulli, Lavon Paganini, PA-C  ferric gluconate 125 mg in sodium chloride 0.9 % 100 mL Inject 125 mg into the vein every Monday, Wednesday, and Friday with hemodialysis. 06/13/19   Angiulli, Lavon Paganini, PA-C  Hydrocortisone (GERHARDT'S BUTT CREAM) CREA Apply 1 application topically 2 (two) times daily. Patient taking differently: Apply 1 application topically 2 (two) times daily as needed for irritation.  06/13/19   Angiulli, Lavon Paganini, PA-C  midodrine (PROAMATINE) 10 MG tablet Take 1 tablet (10 mg total) by mouth in the morning and at bedtime. 09/12/19   Ivin Poot, MD  multivitamin (RENA-VIT) TABS tablet Take 1 tablet by mouth at bedtime. 06/13/19   Angiulli, Lavon Paganini, PA-C  oxyCODONE (OXY IR/ROXICODONE) 5 MG immediate release tablet Take 1 tablet (5 mg total) by mouth every 4 (four) hours as needed for moderate pain. 06/13/19   Angiulli, Lavon Paganini, PA-C  oxyCODONE (ROXICODONE) 5 MG immediate release tablet Take 1 tablet (5 mg total) by mouth every 4 (four) hours as needed. 09/10/19   Angelia Mould, MD       Exam Gen alert elderly WM, not in distress Skin rash w/ blistering/ sloughing, older lesions around the L chest HD cath site, upper chest, scalp and less so on the face,  mild periorbital rash, no conjunctival lesions noted, +small lesions at the corners of the mouth Upper thorax older lesions, most of R back is  sloughed, L back intact Abdomen has mild involvement anterior Perineal and posterior thighs involved w/ newer patchy good sized sloughed lesions Anterior legs not involved, and lower legs not involved Sclera anicteric, throat clear and slightly dry  No jvd or bruits Chest clear bilat at bases no rales RRR no MRG Abd soft ntnd no mass or ascites +bs  GU normal male Ext 1-2+ pretib/ pedal edema Neuro is alert, Ox 3 , nf    Home meds:  - asa 81/ midodrine 10 bid (stopped due to rash)  - pepcid 10 qd/ oxycodone IR 5 qid prn  - prn's/ vitamins/ supplements    K 3.6 BN 24 Cr 4.0  Ca 8  Alb 2.1  WBC 5  Hb 11  COVID 19 negative    Assessment/ Plan: 1. Rash - w/ blistering and sloughing, onset 7-10 days ago w/ upper torso / back/ facial lesions healing and newer lesions around the perineal area and upper legs.  Started a few days after he was started on midodrine, no other new medications noted. He really is not on a lot of medications at home.  Will need to check w/ renal also to see if he had any new HD meds (esa, etc) that were started in the last 3-5 weeks. Midodrine was stopped by the patient after the rash came on.  Diff dx includes SJS / TEN spectrum, pemphigus, paraneoplastic, erythema multiforme.  Pt tolerating well at this time. If estimating the amount of skin involved I would say around 20- 30% maybe.  Per ED MD [facility] declined to take the pt, as did [facility], waiting to here from [facility]. We are asked to admit and hopefully if remains stable, if we can get him an appt w/ [facility] derm (Dr Dreama Saa, [phone]) he could dc home after HD tomorrow.  In the meantime supportive care w/ Silvadene dressings and cautious pain meds (**note pharm warns about using Silvadene in ESRD pt, can get too much sulfa, need to monitor CBC)  Given his ESRD status even though he is  losing some volume , he will not get dehydrated as easily as one who still has good renal function, will defer any IVF's to renal.  2. ESRD- HD TTS, [facility]. Will need to call neph to consult in am.  3. Volume - has sig lower leg edema, no resp issues, he says this has been a problem that comes and goes.  Per renal team.  4. Hypotension - chronic issue prob related to CHF.  Not candidate for midodrine as above. BP's here are not that bad. 5. Hx ICM/ CABG jan '21 - sig complications, AKI and ended up on dialysis.  6. H/o afib - treated surgically per pt , not an issue anymore 7. Hx prostate Ca - rx w/ seed implants about 10 yrs ago per the pt 8. DVT proph - pt allergic to heparin ( and lovenox therefore), hesitant to use calf squeezers given his skin issue.  Pharm suggested option of 2.5 mg eliquis bid as an alternative. Will hold for now.      Kelly Splinter  MD 10/01/2019, 7:03 PM

## 2019-10-01 NOTE — ED Notes (Signed)
Per EDP, pt will be transferred to [facility] ED, no beds available at this time. Waiting to here back from [facility] with accepting physician, etc. Unit Secretary aware.

## 2019-10-01 NOTE — ED Notes (Signed)
Unsuccessful attempt for IV access. Lab at bedside to obtain blood sample. Other ED staff and [facility] aware and reported would look for IV access.

## 2019-10-01 NOTE — ED Notes (Addendum)
Per hospitalist order, pt to stay here for admission. Family at bedside. Pt and family aware.

## 2019-10-01 NOTE — ED Triage Notes (Signed)
Skin is peeling over body

## 2019-10-02 ENCOUNTER — Other Ambulatory Visit: Payer: Self-pay

## 2019-10-02 DIAGNOSIS — Z79891 Long term (current) use of opiate analgesic: Secondary | ICD-10-CM | POA: Diagnosis not present

## 2019-10-02 DIAGNOSIS — Z7982 Long term (current) use of aspirin: Secondary | ICD-10-CM | POA: Diagnosis not present

## 2019-10-02 DIAGNOSIS — R21 Rash and other nonspecific skin eruption: Secondary | ICD-10-CM | POA: Diagnosis present

## 2019-10-02 DIAGNOSIS — L513 Stevens-Johnson syndrome-toxic epidermal necrolysis overlap syndrome: Secondary | ICD-10-CM | POA: Diagnosis present

## 2019-10-02 DIAGNOSIS — Z79899 Other long term (current) drug therapy: Secondary | ICD-10-CM | POA: Diagnosis not present

## 2019-10-02 DIAGNOSIS — Z992 Dependence on renal dialysis: Secondary | ICD-10-CM | POA: Diagnosis not present

## 2019-10-02 DIAGNOSIS — T50905A Adverse effect of unspecified drugs, medicaments and biological substances, initial encounter: Secondary | ICD-10-CM

## 2019-10-02 DIAGNOSIS — Z8546 Personal history of malignant neoplasm of prostate: Secondary | ICD-10-CM | POA: Diagnosis not present

## 2019-10-02 DIAGNOSIS — I252 Old myocardial infarction: Secondary | ICD-10-CM | POA: Diagnosis not present

## 2019-10-02 DIAGNOSIS — Z20822 Contact with and (suspected) exposure to covid-19: Secondary | ICD-10-CM | POA: Diagnosis present

## 2019-10-02 DIAGNOSIS — I2510 Atherosclerotic heart disease of native coronary artery without angina pectoris: Secondary | ICD-10-CM | POA: Diagnosis present

## 2019-10-02 DIAGNOSIS — Z888 Allergy status to other drugs, medicaments and biological substances status: Secondary | ICD-10-CM | POA: Diagnosis not present

## 2019-10-02 DIAGNOSIS — I132 Hypertensive heart and chronic kidney disease with heart failure and with stage 5 chronic kidney disease, or end stage renal disease: Secondary | ICD-10-CM | POA: Diagnosis present

## 2019-10-02 DIAGNOSIS — I480 Paroxysmal atrial fibrillation: Secondary | ICD-10-CM | POA: Diagnosis present

## 2019-10-02 DIAGNOSIS — Z9861 Coronary angioplasty status: Secondary | ICD-10-CM | POA: Diagnosis not present

## 2019-10-02 DIAGNOSIS — Z8249 Family history of ischemic heart disease and other diseases of the circulatory system: Secondary | ICD-10-CM | POA: Diagnosis not present

## 2019-10-02 DIAGNOSIS — Z951 Presence of aortocoronary bypass graft: Secondary | ICD-10-CM | POA: Diagnosis not present

## 2019-10-02 DIAGNOSIS — M351 Other overlap syndromes: Secondary | ICD-10-CM | POA: Diagnosis present

## 2019-10-02 DIAGNOSIS — L512 Toxic epidermal necrolysis [Lyell]: Secondary | ICD-10-CM | POA: Diagnosis present

## 2019-10-02 DIAGNOSIS — I442 Atrioventricular block, complete: Secondary | ICD-10-CM | POA: Diagnosis present

## 2019-10-02 DIAGNOSIS — L511 Stevens-Johnson syndrome: Secondary | ICD-10-CM | POA: Diagnosis present

## 2019-10-02 DIAGNOSIS — Z87891 Personal history of nicotine dependence: Secondary | ICD-10-CM | POA: Diagnosis not present

## 2019-10-02 DIAGNOSIS — R579 Shock, unspecified: Secondary | ICD-10-CM | POA: Diagnosis present

## 2019-10-02 DIAGNOSIS — I5032 Chronic diastolic (congestive) heart failure: Secondary | ICD-10-CM | POA: Diagnosis not present

## 2019-10-02 DIAGNOSIS — J151 Pneumonia due to Pseudomonas: Secondary | ICD-10-CM | POA: Diagnosis present

## 2019-10-02 DIAGNOSIS — R627 Adult failure to thrive: Secondary | ICD-10-CM | POA: Diagnosis not present

## 2019-10-02 DIAGNOSIS — I5042 Chronic combined systolic (congestive) and diastolic (congestive) heart failure: Secondary | ICD-10-CM | POA: Diagnosis present

## 2019-10-02 DIAGNOSIS — I255 Ischemic cardiomyopathy: Secondary | ICD-10-CM | POA: Diagnosis present

## 2019-10-02 DIAGNOSIS — N186 End stage renal disease: Secondary | ICD-10-CM | POA: Diagnosis present

## 2019-10-02 LAB — BASIC METABOLIC PANEL
Anion gap: 12 (ref 5–15)
BUN: 29 mg/dL — ABNORMAL HIGH (ref 8–23)
CO2: 27 mmol/L (ref 22–32)
Calcium: 7.9 mg/dL — ABNORMAL LOW (ref 8.9–10.3)
Chloride: 99 mmol/L (ref 98–111)
Creatinine, Ser: 4.51 mg/dL — ABNORMAL HIGH (ref 0.61–1.24)
GFR calc Af Amer: 13 mL/min — ABNORMAL LOW (ref >60)
GFR calc non Af Amer: 11 mL/min — ABNORMAL LOW (ref >60)
Glucose, Bld: 151 mg/dL — ABNORMAL HIGH (ref 70–99)
Potassium: 4.1 mmol/L (ref 3.5–5.1)
Sodium: 138 mmol/L (ref 135–145)

## 2019-10-02 MED ORDER — FENTANYL CITRATE (PF) 100 MCG/2ML IJ SOLN
25.0000 ug | INTRAMUSCULAR | Status: DC | PRN
  Administered 2019-10-02 – 2019-10-03 (×2): 25 ug via INTRAVENOUS
  Filled 2019-10-02 (×2): qty 2

## 2019-10-02 MED ORDER — SODIUM CHLORIDE 0.9 % IV SOLN: 100.0000 mL | INTRAVENOUS | Status: DC | PRN

## 2019-10-02 MED ORDER — HYDROXYZINE HCL 25 MG PO TABS
25.0000 mg | ORAL_TABLET | Freq: Every day | ORAL | Status: DC
  Administered 2019-10-02: 25 mg via ORAL
  Filled 2019-10-02 (×2): qty 1

## 2019-10-02 MED ORDER — HYDROXYZINE HCL 10 MG PO TABS: 10.0000 mg | ORAL_TABLET | Freq: Three times a day (TID) | ORAL | Status: DC

## 2019-10-02 MED ORDER — HYDROXYZINE HCL 25 MG PO TABS: 25.0000 mg | ORAL_TABLET | Freq: Every day | ORAL | Status: DC

## 2019-10-02 MED ORDER — CHLORHEXIDINE GLUCONATE CLOTH 2 % EX PADS: 6.0000 | MEDICATED_PAD | Freq: Every day | CUTANEOUS | Status: DC

## 2019-10-02 MED ORDER — ALTEPLASE 2 MG IJ SOLR: 2.0000 mg | Freq: Once | INTRAMUSCULAR | Status: DC | PRN

## 2019-10-02 MED ORDER — HYDROXYZINE HCL 10 MG PO TABS
10.0000 mg | ORAL_TABLET | Freq: Three times a day (TID) | ORAL | Status: DC
  Administered 2019-10-02: 10 mg via ORAL

## 2019-10-02 NOTE — Procedures (Signed)
     HEMODIALYSIS TREATMENT NOTE:   3.5-hour heparin free tx completed via [facility] TDC.  Exit site is without erythema, tenderness, or drainage.  Skin tear sustained above insertion site despite careful removal of tegaderm dressing.    Catheter unable to sustain prescribed flow of 400cc/m due to excessively negative arterial pressures.  Flow was reversed with minimal improvement.  No fluid was removed, as ordered.  All blood was returned.  Heparin allergy noted.  Pharmacist confirmed we do not stock 4% sodium citrate.  Will lock catheter ports with saline and instill Cathflo prior to next treatment, if needed.  No changes from pre-HD assessment.  Rockwell Alexandria, RN

## 2019-10-02 NOTE — Progress Notes (Addendum)
Patient Demographics:    Jesse Murphy, is a 86 y.o. male, DOB - Aug 07, 1928, EZM:[id number]  Admit date - 10/01/2019   Admitting Physician Keontre Defino Denton Brick, MD  Outpatient Primary MD for the patient is Leeanne Rio, MD  LOS - 0   Chief Complaint  Patient presents with  . Rash  . Allergic Reaction        Subjective:    Jesse Murphy today has no fevers, no emesis,  No chest pain,     I called and I spoke with Dr Tommy Medal (ID at [facility]) and also spoke with Dr. Blase Mess (Internal medicine at [facility]) , case previously Discussed with Dr. 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 year-old w/ hx of PAF, PNA, CAD/ MI/ ICM / hx CABG jan 2021, HTN, hx of trach, ESRD on HD, combined s/d CHF, CHB, prostate cancer admitted on 10/01/2019 with desquamative rash, only new medication was midodrine which has since been discontinued --- Awaiting transfer to tertiary center but currently [place] [facility] have no beds -No waiting list for transfer available so we have to call every day to see if bed available -Patient previously seen at [facility] so we may have the best chance to transfer to [place] toll-free at [phone]. --- If unable to get hospital to hospital transfer then consider outpatient appointment with- [facility] derm (Dr Dreama Saa, [phone]  1)SJS/TEN-----rash involves about 30% of patient's body, --Other differentials include pemphigus, paraneoplastic, erythema multiforme - I called and I spoke with Dr Tommy Medal (ID at [facility]) and also spoke with Dr. Blase Mess (Internal medicine at [facility]) , case previously Discussed with Dr. Marcello Moores at [facility] Internal [facility])---- --no beds currently available at [place] to hold off on steroids at this time pending transfer and biopsy of skin lesions --Continue topical Silvadene dressing changes for infection prevention -We are to call [phone] [facility] transfer center daily to see if a bed opens up for patient to go to [facility] ---- If unable to get hospital to hospital transfer then consider outpatient appointment with- [facility] derm (Dr Dreama Saa, [phone] --CRP is 9.9, ESR is only 25 which is not elevated when adjusted for patient's  age  86)ESRD--- TTS, discussed with Dr. Pearson Grippe, HD per nephrology team  3)Hypotension--- midodrine discontinued due to concern that this may have triggered a SJS  4)H/o AFib--appears patient had ablation previously, currently sinus  5)CAD/s/p CABG 03/5636--- had complications including AKI and ended up on hemodialysis -Recent echo from 09/05/2019 with EF of 55 to 60% which is up from 25 to 30% back in February 2021  6) history of prostate CA--- status post radiation with seed implants more than 10 years ago  DVT--- rather tricky situation with DVT prophylaxis unable to use TEDs and   SCDs due to desquamating skin lesions, patient has heparin allergy,  -May consider Eliquis 2.5 mg twice daily -No DVT prophylaxis at this time  Disposition/Need for in-Hospital Stay- patient unable to be discharged at this time due to awaiting transfer to tertiary center with burn unit unit for SJS/TEN biopsy and  management  Status is: Inpatient  Remains inpatient appropriate because: awaiting transfer to tertiary center with burn unit unit for SJS/TEN biopsy and  management   Disposition: The patient is from: Home              Anticipated d/c is to: [place]              Anticipated d/c date is: 1 day              Patient currently is not medically stable to d/c. Barriers: Not Clinically Stable- - awaiting transfer to tertiary center with burn unit unit for SJS/TEN biopsy and  management   Code Status : full  Family Communication:    (patient is alert, awake and coherent)    Consults  :  ID and [facility]    Lab Results  Component Value Date   PLT 186 10/01/2019    Inpatient Medications  Scheduled Meds: . aspirin  81 mg Oral Daily  . Chlorhexidine Gluconate Cloth  6 each Topical Daily  . famotidine  10 mg Oral Daily  . multivitamin  1 tablet Oral QHS  . silver sulfADIAZINE   Topical BID  . sodium chloride flush  3 mL Intravenous Q12H   Continuous Infusions: . sodium chloride     PRN Meds:.sodium chloride, acetaminophen, bisacodyl, fentaNYL (SUBLIMAZE) injection, ondansetron **OR** ondansetron (ZOFRAN) IV, oxyCODONE, polyethylene glycol, sodium chloride flush    Anti-infectives (From admission, onward)   None        Objective:   Vitals:   10/02/19 1745 10/02/19 1815 10/02/19 1845 10/02/19 1915  BP: (!) 105/48 (!) 111/50 (!) 98/53 (!) 101/47  Pulse: 95 94 (!) 101 92  Resp:      Temp:      TempSrc:      SpO2:      Weight:      Height:        Wt Readings from Last 3 Encounters:  10/02/19 70.2 kg  09/24/19 70.3 kg  09/10/19 72.6 kg    No intake or output data  in the 24 hours ending 10/02/19 1917   Physical Exam  Gen:- Awake Alert,  In no apparent distress  HEENT:- [place].AT, No sclera icterus Neck-Supple Neck, .  Left IJ HD catheter Lungs-  CTAB , fair symmetrical air movement CV- S1, S2 normal, regular Abd-  +ve B.Sounds, Abd Soft, No tenderness,    Extremity -chronic edema, pedal pulses present Psych-affect is appropriate, oriented x3 Neuro-no new focal deficits, no tremors Skin---please see photos in epic -Media Information   Document Information  Photos  10/02/2019 11:45  Attached To:  Hospital Encounter on 10/01/19  Source Information  Roxan Hockey, MD  [facility]   Media Information   Document Information  Photos    10/02/2019 11:45  Attached To:  Hospital Encounter on 10/01/19  Source Information  Roxan Hockey, MD  [facility]   Media Information   Document Information  Photos    10/02/2019 11:42  Attached To:  Hospital Encounter on 10/01/19  Source Information  Roxan Hockey, MD  [facility]   Media Information   Document Information  Photos    10/02/2019 11:42  Attached To:  Hospital Encounter on 10/01/19  Source Information  Roxan Hockey, MD  [facility]   Media Information   Document Information  Photos    10/02/2019 11:41  Attached To:  Hospital Encounter on 10/01/19  Source Information  Roxan Hockey, MD  [facility]      Data Review:   Micro Results Recent Results (from the past 240 hour(s))  SARS Coronavirus 2 by RT PCR (hospital order, performed in [facility] hospital lab) Nasopharyngeal Nasopharyngeal Swab     Status: None   Collection Time: 10/01/19  4:03 PM   Specimen: Nasopharyngeal Swab  Result Value Ref Range Status   SARS Coronavirus 2 NEGATIVE NEGATIVE Final    Comment: (NOTE) SARS-CoV-2 target nucleic acids are NOT DETECTED.  The SARS-CoV-2 RNA is generally detectable in upper and lower respiratory specimens during the acute phase of  infection. The lowest concentration of SARS-CoV-2 viral copies this assay can detect is 250 copies / mL. A negative result does not preclude SARS-CoV-2 infection and should not be used as the sole basis for treatment or other patient management decisions.  A negative result may occur with improper specimen collection / handling, submission of specimen other than nasopharyngeal swab, presence of viral mutation(s) within the areas targeted by this assay, and inadequate number of viral copies (<250 copies / mL). A negative result must be combined with clinical observations, patient history, and epidemiological information.  Fact Sheet for Patients:   StrictlyIdeas.no  Fact Sheet for Healthcare Providers: BankingDealers.co.za  This test is not yet approved or  cleared by the [place] FDA and has been authorized for detection and/or diagnosis of SARS-CoV-2 by FDA under an Emergency Use Authorization (EUA).  This EUA will remain in effect (meaning this test can be used) for the duration of the COVID-19 declaration under Section 564(b)(1) of the Act, 21 U.S.C. section 360bbb-3(b)(1), unless the authorization is terminated or revoked sooner.  Performed at [facility], [address] [place], [place] [address]     Radiology Reports ECHOCARDIOGRAM COMPLETE  Result Date: 09/05/2019    ECHOCARDIOGRAM REPORT   Patient Name:   USIEL ASTARITA Date of Exam: 09/05/2019 Medical Rec #:  [id number]    Height:       69.0 in Accession #:    [id number]   Weight:       160.0 lb Date of Birth:  09-20-1928   BSA:          1.879 m Patient Age:    19 years     BP:           106/59 mmHg Patient Gender: M            HR:           85 bpm. Exam Location:  [place] Procedure: 2D Echo, Cardiac Doppler and Color Doppler Indications:    I50.9 CHF, I25.10 CAD  History:        Patient has prior history of Echocardiogram examinations, most                 recent 03/20/2019. CHF  and Cardiomyopathy, CAD and NSTEMI, Prior                 CABG, ESRD, Arrythmias:Atrial Fibrillation; Risk                 Factors:Hypertension, Diabetes and Former Smoker.  Sonographer:    Jeneen Montgomery RDMS, RVT, RDCS Referring Phys: [id number] Erma Heritage  Sonographer Comments: Technically difficult study due to poor echo windows. Image acquisition challenging due to patient body habitus. IMPRESSIONS  1. Left ventricular ejection fraction, by estimation, is 55 to 60%. The left ventricle has normal function. Left ventricular endocardial border not optimally defined to evaluate regional wall motion. Left ventricular diastolic parameters were normal.  2. Right ventricular systolic function is normal. The right ventricular size is normal. There is normal pulmonary artery systolic pressure. The estimated right ventricular systolic pressure is 01.6 mmHg.  3. The mitral valve is abnormal, mildly thickened and calcified with moderate annular calcification. Trivial mitral valve regurgitation.  4. The aortic valve is tricuspid. Aortic valve regurgitation is not visualized. Mild to moderate aortic valve sclerosis/calcification is present, without any evidence of aortic stenosis.  5. The inferior vena cava is normal in size with greater than 50% respiratory variability, suggesting right atrial pressure of 3 mmHg. FINDINGS  Left Ventricle: Left ventricular ejection fraction, by estimation, is 55 to 60%. The left ventricle has normal function. Left ventricular endocardial border not optimally defined to evaluate regional wall motion. The left ventricular internal cavity size was normal in size. There is borderline left ventricular hypertrophy. Abnormal (paradoxical) septal motion, consistent with left bundle branch block. Left ventricular diastolic parameters were normal. Right Ventricle: The right ventricular size is normal. No increase in right ventricular wall thickness. Right ventricular systolic function is normal.  There is normal pulmonary artery systolic pressure. The tricuspid regurgitant velocity is 2.76 m/s, and  with an assumed right atrial pressure of 3 mmHg, the estimated right ventricular systolic pressure is 55.3 mmHg. Left Atrium: Left atrial size was normal in size. Right Atrium: Right atrial size was normal in size. Pericardium: There is no evidence of pericardial effusion. Mitral Valve: The mitral valve is abnormal. There is mild thickening of the mitral valve leaflet(s). There is mild calcification of the mitral valve leaflet(s). Moderate mitral annular calcification. Trivial mitral valve regurgitation. Tricuspid Valve: The tricuspid valve is grossly normal. Tricuspid valve regurgitation is trivial. Aortic Valve: The aortic valve is tricuspid. Aortic valve regurgitation is not visualized. Mild to moderate aortic valve sclerosis/calcification is present, without any evidence of aortic stenosis. Mild aortic valve annular calcification. There is moderate calcification of the aortic valve. Pulmonic Valve: The pulmonic valve was not well visualized. Pulmonic valve regurgitation is not visualized. Aorta: The aortic root is normal in size and structure. Venous: The inferior vena cava is normal in size with greater than 50% respiratory variability, suggesting right atrial pressure of 3 mmHg. IAS/Shunts: No atrial level shunt detected by color flow Doppler.  LEFT VENTRICLE PLAX 2D LVIDd:         4.28 cm     Diastology LVIDs:         2.59 cm     LV e' lateral:   8.61 cm/s LV PW:         1.05 cm  LV E/e' lateral: 9.1 LV IVS:        0.82 cm     LV e' medial:    6.38 cm/s LVOT diam:     1.90 cm     LV E/e' medial:  12.2 LV SV:         47 LV SV Index:   25 LVOT Area:     2.84 cm  LV Volumes (MOD) LV vol d, MOD A2C: 86.2 ml LV vol d, MOD A4C: 63.7 ml LV vol s, MOD A2C: 39.5 ml LV vol s, MOD A4C: 31.5 ml LV SV MOD A2C:     46.7 ml LV SV MOD A4C:     63.7 ml LV SV MOD BP:      42.2 ml RIGHT VENTRICLE RV S prime:     9.67  cm/s TAPSE (M-mode): 1.4 cm LEFT ATRIUM             Index LA diam:        2.60 cm 1.38 cm/m LA Vol (A2C):   55.3 ml 29.43 ml/m LA Vol (A4C):   20.7 ml 11.02 ml/m LA Biplane Vol:         19.70 ml/m  AORTIC VALVE LVOT Vmax:   84.90 cm/s LVOT Vmean:  56.200 cm/s LVOT VTI:    0.166 m  AORTA Ao Root diam: 3.20 cm MITRAL VALVE               TRICUSPID VALVE MV Area (PHT):             TR Peak grad:   30.5 mmHg MV Decel Time: 232 msec    TR Vmax:        276.00 cm/s MV E velocity: 78.10 cm/s MV A velocity: 81.00 cm/s  SHUNTS MV E/A ratio:  0.96        Systemic VTI:  0.17 m                            Systemic Diam: 1.90 cm Rozann Lesches MD Electronically signed by Rozann Lesches MD Signature Date/Time: 09/05/2019/5:07:12 PM    Final      CBC Recent Labs  Lab 10/01/19 1644  WBC 5.7  HGB 11.6*  HCT 38.1*  PLT 186  MCV 106.4*  MCH 32.4  MCHC 30.4  RDW 19.4*  LYMPHSABS 0.6*  MONOABS 0.4  EOSABS 0.3  BASOSABS 0.0    Chemistries  Recent Labs  Lab 10/01/19 1644 10/02/19 0425  NA 139 138  K 3.6 4.1  CL 97* 99  CO2 30 27  GLUCOSE 107* 151*  BUN 24* 29*  CREATININE 4.02* 4.51*  CALCIUM 8.0* 7.9*  AST 20  --   ALT 13  --   ALKPHOS 94  --   BILITOT 0.7  --    ------------------------------------------------------------------------------------------------------------------ No results for input(s): CHOL, HDL, LDLCALC, TRIG, CHOLHDL, LDLDIRECT in the last 72 hours.  Lab Results  Component Value Date   HGBA1C 6.1 (H) 03/13/2019   ------------------------------------------------------------------------------------------------------------------ No results for input(s): TSH, T4TOTAL, T3FREE, THYROIDAB in the last 72 hours.  Invalid input(s): FREET3 ------------------------------------------------------------------------------------------------------------------ No results for input(s): VITAMINB12, FOLATE, FERRITIN, TIBC, IRON, RETICCTPCT in the last 72 hours.  Coagulation profile Recent  Labs  Lab 10/01/19 1644  INR 1.0    No results for input(s): DDIMER in the last 72 hours.  Cardiac Enzymes No results for input(s): CKMB, TROPONINI, MYOGLOBIN in the last 168 hours.  Invalid input(s): CK ------------------------------------------------------------------------------------------------------------------  Component Value Date/Time   BNP 800.4 (H) 03/27/2019 1255    awaiting transfer to tertiary center with burn unit unit for SJS/TEN biopsy and  management  Roxan Hockey M.D on 10/02/2019 at 7:17 PM  Go to [email or web address] - for contact info  [facility] - Office  [phone]

## 2019-10-02 NOTE — Progress Notes (Signed)
  Media Information   Document Information  Photos    10/02/2019 11:45  Attached To:  Hospital Encounter on 10/01/19  Source Information  Roxan Hockey, MD  [facility]    Media Information   Document Information  Photos    10/02/2019 11:45  Attached To:  Hospital Encounter on 10/01/19  Source Information  Roxan Hockey, MD  [facility]    Media Information   Document Information  Photos    10/02/2019 11:41  Attached To:  Hospital Encounter on 10/01/19  Source Information  Roxan Hockey, MD  [facility]     Media Information   Document Information  Photos    10/02/2019 11:42  Attached To:  Hospital Encounter on 10/01/19  Source Information  Roxan Hockey, MD  [facility]    Media Information   Document Information  Photos    10/02/2019 11:42  Attached To:  Hospital Encounter on 10/01/19  Source Information  Roxan Hockey, MD  [facility]

## 2019-10-02 NOTE — Consult Note (Signed)
CEBASTIAN NEIS Admit Date: 10/01/2019 10/02/2019 Rexene Agent Requesting Physician:  Denton Brick MD  Reason for Consult:  ESRD Comanagement HPI:  5M ESRD [place] THS via left IJ TDC and maturing AVF in right arm admitted overnight with progressive desquamating skin rash.  At the end of July patient was started on midodrine and then received the [place] COVID-19 vaccination in early August.  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  Patient also has past history of atrial fibrillation, CAD/ICM/history of CABG, history of complete heart block, chronic systolic/diastolic CHF  This morning patient complains of pain and ongoing sloughing of skin.  He is due for dialysis today.  Current labs are notable for potassium of 4.1, bicarbonate 27, hemoglobin 11.6.  He is on room air, stable vital signs.  ROS Balance of 12 systems is negative w/ exceptions as above  PMH  Past Medical History:  Diagnosis Date  . Anemia   . CAD (coronary artery disease)    a. s/p PCI in 1992 b. Coronary CT in 01/2018 showing extensive coronary calcification; FFR indeterminate --> medical management pursued at that time as patient not interested in repeat cath. c. s/p CABGx3 in 02/2019.  . Cancer [facility])    prostate  . Cardiogenic shock ([place])   . CHB (complete heart block) (HCC)    a. due to vagal event in 2021, requiring temp pacer.  . Chronic combined systolic and diastolic CHF (congestive heart failure) ([place])   . Debilitated   . Dyspnea   . End stage renal disease ([place])   . Esophagitis   . H/O tracheostomy    a. during admit in 2021 for respiratory failure.  . Hematuria   . Hypertension   . Hypoalbuminemia   . Ischemic cardiomyopathy   . Myocardial infarction ([place])   . PAF (paroxysmal atrial fibrillation) ([place])   . Pleural effusion, left    a. s/p  pleurX catheter  . Pseudomonas pneumonia ([place])   . Thrombocytopenia ([place])    [place]  Past Surgical History:  Procedure Laterality Date  . BASCILIC VEIN TRANSPOSITION Right 09/10/2019   Procedure: RIGHT ARM 1ST STAGE BASCILIC VEIN TRANSPOSITION;  Surgeon: Angelia Mould, MD;  Location: [place];  Service: Vascular;  Laterality: Right;  . CHEST TUBE INSERTION Left 04/23/2019   Procedure: INSERTION PLEURAL DRAINAGE CATHETER TO DRAIN LEFT PLEURAL EFFUSION;  Surgeon: Ivin Poot, MD;  Location: [place];  Service: Thoracic;  Laterality: Left;  . CLIPPING OF ATRIAL APPENDAGE N/A 03/16/2019   Procedure: CLIPPING OF LEFT  ATRIAL APPENDAGE - USING ATRICLIP SIZE 40;  Surgeon: Ivin Poot, MD;  Location: [place];  Service: Open Heart Surgery;  Laterality: N/A;  . COLONOSCOPY N/A 08/25/2017   Procedure: COLONOSCOPY;  Surgeon: Rogene Houston, MD;  [facility];  Service: Endoscopy;  Laterality: N/A;  . CORONARY ARTERY BYPASS GRAFT N/A 03/16/2019   Procedure: CORONARY ARTERY BYPASS GRAFTING (CABG), ON PUMP, TIMES THREE, USING LEFT INTERNAL MAMMARY ARTERY, RIGHT GREATER SAPHENOUS VEIN HARVESTED ENDOSCOPICALLY;  Surgeon: Ivin Poot, MD;  Location: [place];  Service: Open Heart Surgery;  Laterality: N/A;  swan only  . CYSTOSCOPY WITH FULGERATION N/A 04/30/2019   Procedure: CYSTOSCOPY WITH FULGERATION;  Surgeon: Irine Seal, MD;  Location: [place];  Service: Urology;  Laterality: N/A;  . HERNIA REPAIR    . IR  FLUORO GUIDE CV LINE LEFT  04/23/2019  . IR FLUORO GUIDE CV LINE RIGHT  05/24/2019  . IR US GUIDE VASC ACCESS LEFT  04/23/2019  . MAZE N/A 03/16/2019   Procedure: MAZE;  Surgeon: Ivin Poot, MD;  Location: [place];  Service: Open Heart Surgery;  Laterality: N/A;  . PLACEMENT OF IMPELLA LEFT VENTRICULAR ASSIST DEVICE Right 03/27/2019   Procedure: Placement Of Impella Left Ventricular Assist Device using ABIOMED Impella 5.5 with SmartAssist Device.;  Surgeon: Wonda Olds, MD;  [facility];  Service: Thoracic;  Laterality: Right;  . PROSTATE SURGERY    . REMOVAL OF IMPELLA LEFT VENTRICULAR ASSIST DEVICE N/A 04/10/2019   Procedure: REMOVAL OF IMPELLA LEFT VENTRICULAR ASSIST DEVICE WITH INSERTION OF RIGHT FEMORAL ARTERIAL LINE;  Surgeon: Ivin Poot, MD;  Location: [place];  Service: Open Heart Surgery;  Laterality: N/A;  . REMOVAL OF PLEURAL DRAINAGE CATHETER Left 07/02/2019   Procedure: REMOVAL OF PLEURAL DRAINAGE CATHETER;  Surgeon: Ivin Poot, MD;  Location: [place];  Service: Thoracic;  Laterality: Left;  . RIGHT/LEFT HEART CATH AND CORONARY ANGIOGRAPHY N/A 03/15/2019   Procedure: RIGHT/LEFT HEART CATH AND CORONARY ANGIOGRAPHY;  Surgeon: Burnell Blanks, MD;  Location: [place] CV LAB;  Service: Cardiovascular;  Laterality: N/A;  . TEE WITHOUT CARDIOVERSION N/A 03/16/2019   Procedure: TRANSESOPHAGEAL ECHOCARDIOGRAM (TEE);  Surgeon: Prescott Gum, Collier Salina, MD;  Location: [place];  Service: Open Heart Surgery;  Laterality: N/A;  . TRACHEOSTOMY TUBE PLACEMENT N/A 03/27/2019   Procedure: TRACHEOSTOMY placed using [place] 8DCT Cuffed.;  Surgeon: Wonda Olds, MD;  [facility];  Service: Thoracic;  Laterality: N/A;  . TRANSURETHRAL RESECTION OF BLADDER NECK N/A 05/13/2019   Procedure: CYSTOSCOPY CLOT EVACUATION FULGRATION CYSTOGRAM AND INSTILLATION OF FORMALIN;  Surgeon: Lucas Mallow, MD;  Location: [place];  Service: Urology;  Laterality: N/A;   FH  Family History  Problem Relation Age of Onset  . CAD Mother 42  . CAD Father 66  . CAD Sister   . CAD Brother    SH  reports that he quit smoking about 29 years ago. His smoking use included cigarettes. He has a 12.50 pack-year smoking history. He has never used smokeless tobacco. He reports that he does not drink alcohol and does not use drugs. Allergies  Allergies  Allergen Reactions  . Atorvastatin Anaphylaxis and Rash    Leg pain  . Midodrine Hcl Rash    Severe sloughing body skin rash 3 days after starting  midodrine in Aug 2021  . Amiodarone     Rash (see 05/2019 hospital notes), but was on other medications at the time so unclear which agent   . Heparin Other (See Comments)    Thrombocytopenia/ HIT  . Reglan [Metoclopramide]     Rash (see 05/2019 hospital notes), but was on other medications at the time so unclear which agent   Home medications Prior to Admission medications   Medication Sig Start Date End Date Taking? Authorizing Provider  acetaminophen (TYLENOL) 325 MG tablet Take 650 mg by mouth every 6 (six) hours as needed for mild pain or headache.   Yes [provider]  anticoagulant sodium citrate 4 GM/100ML SOLN 5 mLs by Intracatheter route every Monday, Wednesday, and Friday with hemodialysis. 05/21/19  Yes Lars Pinks M, PA-C  aspirin 81 MG chewable tablet Chew 1 tablet (81 mg total) by mouth daily. 05/21/19  Yes Lars Pinks M, PA-C  famotidine (PEPCID) 20 MG tablet Take  20 mg by mouth daily. 09/23/19  Yes [provider]  ferric gluconate 125 mg in sodium chloride 0.9 % 100 mL Inject 125 mg into the vein every Monday, Wednesday, and Friday with hemodialysis. 06/13/19  Yes Angiulli, Lavon Paganini, PA-C  Hydrocortisone (GERHARDT'S BUTT CREAM) CREA Apply 1 application topically 2 (two) times daily. Patient taking differently: Apply 1 application topically 2 (two) times daily as needed for irritation.  06/13/19  Yes Angiulli, Lavon Paganini, PA-C  multivitamin (RENA-VIT) TABS tablet Take 1 tablet by mouth at bedtime. 06/13/19  Yes Angiulli, Lavon Paganini, PA-C  oxyCODONE (OXY IR/ROXICODONE) 5 MG immediate release tablet Take 1 tablet (5 mg total) by mouth every 4 (four) hours as needed for moderate pain. 06/13/19  Yes Angiulli, Lavon Paganini, PA-C  Darbepoetin Alfa (ARANESP) 150 MCG/0.3ML SOSY injection Inject 0.3 mLs (150 mcg total) into the vein every Wednesday with hemodialysis. 06/13/19   Angiulli, Lavon Paganini, PA-C  famotidine (PEPCID) 10 MG tablet Take 1 tablet (10 mg total) by mouth  daily. Patient not taking: Reported on 10/01/2019 06/13/19   Cathlyn Parsons, PA-C    Current Medications Scheduled Meds: . aspirin  81 mg Oral Daily  . famotidine  10 mg Oral Daily  . multivitamin  1 tablet Oral QHS  . silver sulfADIAZINE   Topical BID  . sodium chloride flush  3 mL Intravenous Q12H   Continuous Infusions: . sodium chloride     PRN Meds:.sodium chloride, acetaminophen, bisacodyl, fentaNYL (SUBLIMAZE) injection, ondansetron **OR** ondansetron (ZOFRAN) IV, oxyCODONE, polyethylene glycol, sodium chloride flush  CBC Recent Labs  Lab 10/01/19 1644  WBC 5.7  NEUTROABS 4.2  HGB 11.6*  HCT 38.1*  MCV 106.4*  PLT 626   Basic Metabolic Panel Recent Labs  Lab 10/01/19 1644 10/02/19 0425  NA 139 138  K 3.6 4.1  CL 97* 99  CO2 30 27  GLUCOSE 107* 151*  BUN 24* 29*  CREATININE 4.02* 4.51*  CALCIUM 8.0* 7.9*    Physical Exam  Blood pressure (!) 94/42, pulse 93, temperature 99 F (37.2 C), temperature source Oral, resp. rate 18, height 5\' 8"  (1.727 m), weight 71.2 kg, SpO2 96 %. GEN: Nontoxic appearing, awake, alert, oriented ENT: NCAT EYES: EOMI CV: Regular, no rub PULM: Clear bilaterally, normal work of breathing ABD: No rashes SKIN: Numerous areas of actively desquamating skin EXT: 2+ lower extremity edema VASCULAR: Left IJ TDC bandaged; right upper arm AV fistula with thrill  Assessment 50M ESRD LIJ TDC [facility] THS with progressive desquamating rash concern for SJS/TEN  1. ESRD L IJ TDC [facility] THS 2. Desquamating skin rash concern for SJS/TEN 3. Maturing right arm AV fistula 4. 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 Anemia, hemoglobin 11.6 6. CAD/ICM/combined chronic systolic and diastolic heart failure 7. Recent COVID-19 vaccination with [employer] 8. Recent exposure to veteran, perhaps the initial precipitating event for #2 9. Paroxysmal atrial fibrillation no UF 10. History of prostate cancer  Plan 1. HD today: 2K, no UF, 3.5 hours, no  heparin 2. Will hold IVF at the current time, is taking in some oral nutrition,   Rexene Agent  [phone] pgr 10/02/2019, 10:01 AM

## 2019-10-02 NOTE — Care Management Obs Status (Signed)
[place] NOTIFICATION   Patient Details  Name: Jesse Murphy MRN: [id number] Date of Birth: 17-Nov-1928   Medicare Observation Status Notification Given:  Yes    Tommy Medal 10/02/2019, 5:01 PM

## 2019-10-03 DIAGNOSIS — L513 Stevens-Johnson syndrome-toxic epidermal necrolysis overlap syndrome: Secondary | ICD-10-CM | POA: Diagnosis not present

## 2019-10-03 DIAGNOSIS — I5032 Chronic diastolic (congestive) heart failure: Secondary | ICD-10-CM | POA: Diagnosis not present

## 2019-10-03 DIAGNOSIS — R627 Adult failure to thrive: Secondary | ICD-10-CM | POA: Diagnosis not present

## 2019-10-03 DIAGNOSIS — N186 End stage renal disease: Secondary | ICD-10-CM | POA: Diagnosis not present

## 2019-10-03 DIAGNOSIS — Z8572 Personal history of non-Hodgkin lymphomas: Secondary | ICD-10-CM

## 2019-10-03 DIAGNOSIS — Z8679 Personal history of other diseases of the circulatory system: Secondary | ICD-10-CM

## 2019-10-03 NOTE — Progress Notes (Signed)
Called report to [facility] and the receiving nurse at [facility].  All questions answered.  Patients son also notified of transfer.  Patient left via [facility] in stable condition.

## 2019-10-03 NOTE — Progress Notes (Signed)
Brief Summary 86 y.o.year-old w/ hx of PAF, PNA, CAD/ MI/ ICM / hx CABG jan 2021, HTN, hx of trach, ESRD on HD, combined s/d CHF, CHB, prostate cancer admitted on 10/01/2019 with desquamative rash, only new medication was midodrine which has since been discontinued --- Awaiting transfer to tertiary center but currently [facility] [place] [facility] have no beds -No waiting list for transfer available so we have to call every day to see if bed available -Patient previously seen at [facility] so we may have the best chance to transfer to [place] at [phone]. --- If unable to get hospital to hospital transfer then consider outpatient appointment with- [facility] derm (Dr Dreama Saa, [phone]  1)SJS/TEN-----rash involves about 30% of patient's body, --Other differentials include pemphigus, paraneoplastic, erythema multiforme - I called and I spoke with Dr Tommy Medal (ID at [facility]) and also spoke with Dr. Blase Mess (Internal medicine at [facility]) , case previously Discussed with Dr. Marcello Moores at [facility] Internal Medicine)---- --no beds currently available at [place] to hold off on steroids at this time pending transfer and biopsy of skin lesions --Continue topical Silvadene dressing changes for infection prevention -We are to call [phone] [facility] transfer center daily to see if a bed opens up for patient to go to [facility] ---- If unable to get hospital to hospital transfer then consider outpatient appointment with- [facility] derm (Dr Dreama Saa, [phone] --CRP is 9.9, ESR is only 25 which is not elevated when adjusted for patient's age  3)ESRD--- TTS, discussed with Dr. Pearson Grippe, HD per nephrology team  3)Hypotension--- midodrine discontinued due to concern that this may have triggered a SJS  4)H/o AFib--appears patient had ablation previously, currently sinus  5)CAD/s/p CABG 10/3808--- had  complications including AKI and ended up on hemodialysis -Recent echo from 09/05/2019 with EF of 55 to 60% which is up from 25 to 30% back in February 2021  6) history of prostate CA--- status post radiation with seed implants more than 10 years ago  DVT--- rather tricky situation with DVT prophylaxis unable to use TEDs and  SCDs due to desquamating skin lesions, patient has heparin allergy,  -May consider Eliquis 2.5 mg twice daily -No DVT prophylaxis at this time  Disposition/Need for in-Hospital Stay- patient unable to be discharged at this time due to awaiting transfer to tertiary center with burn unit unit for SJS/TEN biopsy and  management  Status is: Inpatient  Remains inpatient appropriate because: awaiting transfer to tertiary center with burn unit unit for SJS/TEN biopsy and  management   Disposition: The patient is from: Home  Anticipated d/c is to: [facility]  Anticipated d/c date is: 1 day  Patient currently is not medically stable to d/c. Barriers: Not Clinically Stable- - awaiting transfer to tertiary center with burn unit unit for SJS/TEN biopsy and  management   Code Status : full  Family Communication:    (patient is alert, awake and coherent)

## 2019-10-03 NOTE — Discharge Summary (Signed)
Physician Discharge Summary  Patient ID: Jesse Murphy MRN: [id number] DOB/AGE: 1928/07/10 86 y.o.  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 ([place]) ESRD on dialysis [facility]) Adult failure to thrive Chronic diastolic heart failure (HCC) S/P CABG x 3 Failure to thrive in adult History of non-Hodgkin's lymphoma History of atrial fibrillation- treated surgically, no longer has it per the patient Personal history of prostate cancer Stevens-Johnson syndrome [facility]   Discharge Diagnoses:  Principal Problem:   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 ([place]) Active Problems:   Chronic diastolic heart failure (HCC)   S/P CABG x 3   Failure to thrive in adult   ESRD on dialysis ([place])   History of non-Hodgkin's lymphoma   Adult failure to thrive   History of atrial fibrillation- treated surgically, no longer has it per the patient   Personal history of prostate cancer   Stevens-Johnson syndrome ([place])   Discharged Condition: Stable  Hospital Course: Brief Summary -86 y.o.year-old w/ hx of PAF, PNA, CAD/ MI/ ICM / hx CABG jan 2021, HTN, hx of trach, ESRD on HD, combined s/d CHF, CHB, prostate cancer admitted on 10/01/2019 with desquamative rash, only new medication was midodrine which has since been discontinued --- Awaiting transfer to tertiary center but currently [place] [facility] have no beds 1)SJS/TEN-----rash involves about 30% of patient's body, --Other differentials include pemphigus, paraneoplastic, erythema multiforme - I called and I spoke with Dr Tommy Medal (ID at [facility]) and also spoke with Dr. Blase Mess (Internal medicine at [facility]) , case previously Discussed with Dr. Marcello Moores at [facility] Internal Medicine)---- --no beds currently available at [place] to hold off  on steroids at this time pending transfer and biopsy of skin lesions --Continue topical Silvadene dressing changes for infection prevention -We are to call [phone] [facility] transfer center daily to see if a bed opens up for patient to go to [facility] ---- If unable to get hospital to hospital transfer then consider outpatient appointment with- [facility] derm (Dr Dreama Saa, [phone] --CRP is 9.9, ESR is only 25 which is not elevated when adjusted for patient's age  63)ESRD--- TTS, discussed with Dr. Pearson Grippe, HD per nephrology team  3)Hypotension--- midodrine discontinued due to concern that this may have triggered a SJS  4)H/o AFib--appears patient had ablation previously, currently sinus  5)CAD/s/p CABG 03/3555--- had complications including AKI and ended up on hemodialysis -Recent echo from 09/05/2019 with EF of 55 to 60% which is up from 25 to 30% back in February 2021  6) History of prostate CA--- status post radiation with seed implants more than 10 years ago    Consults:  Infectious disease, Internal medicine, Nephrology   Significant Diagnostic Studies:  SARS coronavirus 2-negative  Treatments:  Scheduled Meds: . aspirin  81 mg Oral Daily  . Chlorhexidine Gluconate Cloth  6 each Topical Daily  . famotidine  10 mg Oral Daily  . multivitamin  1 tablet Oral QHS  . silver sulfADIAZINE   Topical BID  . sodium chloride flush  3 mL Intravenous Q12H   Continuous Infusions: . sodium chloride     PRN Meds:.sodium chloride, acetaminophen, bisacodyl, fentaNYL (SUBLIMAZE) injection, ondansetron **OR** ondansetron (ZOFRAN) IV, oxyCODONE, polyethylene glycol, sodium     Hemodialysis Topical Silvadene dressing  Discharge Exam: Blood pressure (!) 104/42, pulse 98, temperature 100.3 F (37.9 C), resp. rate 19,  height '5\' 8"'  (1.727 m), weight 70.2 kg, SpO2 95 %. Gen:- Awake Alert,  In no apparent distress  HEENT:- [place].AT, No sclera icterus Neck-Supple Neck, .  Left IJ HD  catheter Lungs-  CTAB , fair symmetrical air movement CV- S1, S2 normal, regular Abd-  +ve B.Sounds, Abd Soft, No tenderness,    Extremity -chronic edema, pedal pulses present Psych-affect is appropriate, oriented x3 Neuro-no new focal deficits, no tremors Skin---please see photos in epic  Disposition:  There are no questions and answers to display.            SignedBernadette Hoit 10/03/2019, 8:17 AM

## 2019-10-04 ENCOUNTER — Ambulatory Visit: Payer: Medicare Other | Admitting: Physical Therapy

## 2019-10-10 DIAGNOSIS — L100 Pemphigus vulgaris: Secondary | ICD-10-CM | POA: Insufficient documentation

## 2019-10-13 ENCOUNTER — Emergency Department (HOSPITAL_COMMUNITY): Payer: Medicare Other

## 2019-10-13 ENCOUNTER — Inpatient Hospital Stay (HOSPITAL_COMMUNITY)
Admission: EM | Admit: 2019-10-13 | Discharge: 2019-10-17 | DRG: 393 | Disposition: A | Discharge status: Skilled Nursing Facility | Payer: Medicare Other | Attending: Internal Medicine | Admitting: Internal Medicine

## 2019-10-13 DIAGNOSIS — N186 End stage renal disease: Secondary | ICD-10-CM

## 2019-10-13 DIAGNOSIS — D631 Anemia in chronic kidney disease: Secondary | ICD-10-CM | POA: Diagnosis present

## 2019-10-13 DIAGNOSIS — D62 Acute posthemorrhagic anemia: Secondary | ICD-10-CM | POA: Diagnosis present

## 2019-10-13 DIAGNOSIS — Z7189 Other specified counseling: Secondary | ICD-10-CM | POA: Diagnosis not present

## 2019-10-13 DIAGNOSIS — Z923 Personal history of irradiation: Secondary | ICD-10-CM | POA: Diagnosis not present

## 2019-10-13 DIAGNOSIS — I5043 Acute on chronic combined systolic (congestive) and diastolic (congestive) heart failure: Secondary | ICD-10-CM | POA: Diagnosis not present

## 2019-10-13 DIAGNOSIS — Z992 Dependence on renal dialysis: Secondary | ICD-10-CM

## 2019-10-13 DIAGNOSIS — Z7982 Long term (current) use of aspirin: Secondary | ICD-10-CM

## 2019-10-13 DIAGNOSIS — E8889 Other specified metabolic disorders: Secondary | ICD-10-CM | POA: Diagnosis present

## 2019-10-13 DIAGNOSIS — Z8546 Personal history of malignant neoplasm of prostate: Secondary | ICD-10-CM

## 2019-10-13 DIAGNOSIS — Z66 Do not resuscitate: Secondary | ICD-10-CM | POA: Diagnosis present

## 2019-10-13 DIAGNOSIS — I480 Paroxysmal atrial fibrillation: Secondary | ICD-10-CM | POA: Diagnosis present

## 2019-10-13 DIAGNOSIS — Z888 Allergy status to other drugs, medicaments and biological substances status: Secondary | ICD-10-CM

## 2019-10-13 DIAGNOSIS — Z515 Encounter for palliative care: Secondary | ICD-10-CM | POA: Diagnosis not present

## 2019-10-13 DIAGNOSIS — I252 Old myocardial infarction: Secondary | ICD-10-CM

## 2019-10-13 DIAGNOSIS — T380X5A Adverse effect of glucocorticoids and synthetic analogues, initial encounter: Secondary | ICD-10-CM | POA: Diagnosis present

## 2019-10-13 DIAGNOSIS — K626 Ulcer of anus and rectum: Principal | ICD-10-CM | POA: Diagnosis present

## 2019-10-13 DIAGNOSIS — R54 Age-related physical debility: Secondary | ICD-10-CM | POA: Diagnosis present

## 2019-10-13 DIAGNOSIS — I5032 Chronic diastolic (congestive) heart failure: Secondary | ICD-10-CM | POA: Diagnosis not present

## 2019-10-13 DIAGNOSIS — Z20822 Contact with and (suspected) exposure to covid-19: Secondary | ICD-10-CM | POA: Diagnosis present

## 2019-10-13 DIAGNOSIS — K6289 Other specified diseases of anus and rectum: Secondary | ICD-10-CM | POA: Diagnosis present

## 2019-10-13 DIAGNOSIS — E119 Type 2 diabetes mellitus without complications: Secondary | ICD-10-CM | POA: Diagnosis not present

## 2019-10-13 DIAGNOSIS — L100 Pemphigus vulgaris: Secondary | ICD-10-CM | POA: Diagnosis present

## 2019-10-13 DIAGNOSIS — I132 Hypertensive heart and chronic kidney disease with heart failure and with stage 5 chronic kidney disease, or end stage renal disease: Secondary | ICD-10-CM | POA: Diagnosis present

## 2019-10-13 DIAGNOSIS — Z9861 Coronary angioplasty status: Secondary | ICD-10-CM | POA: Diagnosis not present

## 2019-10-13 DIAGNOSIS — Z8249 Family history of ischemic heart disease and other diseases of the circulatory system: Secondary | ICD-10-CM

## 2019-10-13 DIAGNOSIS — I255 Ischemic cardiomyopathy: Secondary | ICD-10-CM | POA: Diagnosis present

## 2019-10-13 DIAGNOSIS — Z951 Presence of aortocoronary bypass graft: Secondary | ICD-10-CM | POA: Diagnosis not present

## 2019-10-13 DIAGNOSIS — Z87891 Personal history of nicotine dependence: Secondary | ICD-10-CM

## 2019-10-13 DIAGNOSIS — D72829 Elevated white blood cell count, unspecified: Secondary | ICD-10-CM | POA: Diagnosis present

## 2019-10-13 DIAGNOSIS — I5042 Chronic combined systolic (congestive) and diastolic (congestive) heart failure: Secondary | ICD-10-CM | POA: Diagnosis present

## 2019-10-13 DIAGNOSIS — I2510 Atherosclerotic heart disease of native coronary artery without angina pectoris: Secondary | ICD-10-CM

## 2019-10-13 DIAGNOSIS — E8770 Fluid overload, unspecified: Secondary | ICD-10-CM | POA: Diagnosis not present

## 2019-10-13 DIAGNOSIS — R000 Tachycardia, unspecified: Secondary | ICD-10-CM | POA: Diagnosis not present

## 2019-10-13 DIAGNOSIS — K4090 Unilateral inguinal hernia, without obstruction or gangrene, not specified as recurrent: Secondary | ICD-10-CM | POA: Insufficient documentation

## 2019-10-13 DIAGNOSIS — Y929 Unspecified place or not applicable: Secondary | ICD-10-CM

## 2019-10-13 DIAGNOSIS — K625 Hemorrhage of anus and rectum: Secondary | ICD-10-CM | POA: Diagnosis not present

## 2019-10-13 DIAGNOSIS — E1122 Type 2 diabetes mellitus with diabetic chronic kidney disease: Secondary | ICD-10-CM | POA: Diagnosis present

## 2019-10-13 DIAGNOSIS — K5909 Other constipation: Secondary | ICD-10-CM | POA: Diagnosis present

## 2019-10-13 DIAGNOSIS — I953 Hypotension of hemodialysis: Secondary | ICD-10-CM | POA: Diagnosis present

## 2019-10-13 DIAGNOSIS — H9190 Unspecified hearing loss, unspecified ear: Secondary | ICD-10-CM | POA: Diagnosis present

## 2019-10-13 LAB — PROTIME-INR
INR: 1.1 (ref 0.8–1.2)
Prothrombin Time: 13.7 seconds (ref 11.4–15.2)

## 2019-10-13 LAB — COMPREHENSIVE METABOLIC PANEL
ALT: 48 U/L — ABNORMAL HIGH (ref 0–44)
AST: 21 U/L (ref 15–41)
Albumin: 2.2 g/dL — ABNORMAL LOW (ref 3.5–5.0)
Alkaline Phosphatase: 58 U/L (ref 38–126)
Anion gap: 12 (ref 5–15)
BUN: 68 mg/dL — ABNORMAL HIGH (ref 8–23)
CO2: 26 mmol/L (ref 22–32)
Calcium: 8 mg/dL — ABNORMAL LOW (ref 8.9–10.3)
Chloride: 96 mmol/L — ABNORMAL LOW (ref 98–111)
Creatinine, Ser: 4.05 mg/dL — ABNORMAL HIGH (ref 0.61–1.24)
GFR calc Af Amer: 14 mL/min — ABNORMAL LOW (ref >60)
GFR calc non Af Amer: 12 mL/min — ABNORMAL LOW (ref >60)
Glucose, Bld: 150 mg/dL — ABNORMAL HIGH (ref 70–99)
Potassium: 4.4 mmol/L (ref 3.5–5.1)
Sodium: 134 mmol/L — ABNORMAL LOW (ref 135–145)
Total Bilirubin: 0.6 mg/dL (ref 0.3–1.2)
Total Protein: 4.7 g/dL — ABNORMAL LOW (ref 6.5–8.1)

## 2019-10-13 LAB — CBC WITH DIFFERENTIAL/PLATELET
Abs Immature Granulocytes: 0.35 10*3/uL — ABNORMAL HIGH (ref 0.00–0.07)
Basophils Absolute: 0 10*3/uL (ref 0.0–0.1)
Basophils Relative: 0 %
Eosinophils Absolute: 0 10*3/uL (ref 0.0–0.5)
Eosinophils Relative: 0 %
HCT: 29.1 % — ABNORMAL LOW (ref 39.0–52.0)
Hemoglobin: 9 g/dL — ABNORMAL LOW (ref 13.0–17.0)
Immature Granulocytes: 2 %
Lymphocytes Relative: 3 %
Lymphs Abs: 0.4 10*3/uL — ABNORMAL LOW (ref 0.7–4.0)
MCH: 32.3 pg (ref 26.0–34.0)
MCHC: 30.9 g/dL (ref 30.0–36.0)
MCV: 104.3 fL — ABNORMAL HIGH (ref 80.0–100.0)
Monocytes Absolute: 0.7 10*3/uL (ref 0.1–1.0)
Monocytes Relative: 5 %
Neutro Abs: 14.2 10*3/uL — ABNORMAL HIGH (ref 1.7–7.7)
Neutrophils Relative %: 90 %
Platelets: 258 10*3/uL (ref 150–400)
RBC: 2.79 MIL/uL — ABNORMAL LOW (ref 4.22–5.81)
RDW: 17.6 % — ABNORMAL HIGH (ref 11.5–15.5)
WBC: 15.7 10*3/uL — ABNORMAL HIGH (ref 4.0–10.5)
nRBC: 0 % (ref 0.0–0.2)

## 2019-10-13 LAB — SARS CORONAVIRUS 2 BY RT PCR (HOSPITAL ORDER, PERFORMED IN ~~LOC~~ HOSPITAL LAB): SARS Coronavirus 2: NEGATIVE

## 2019-10-13 LAB — TYPE AND SCREEN
ABO/RH(D): B POS
Antibody Screen: NEGATIVE

## 2019-10-13 LAB — ABO/RH: ABO/RH(D): B POS

## 2019-10-13 LAB — HEMOGLOBIN AND HEMATOCRIT, BLOOD
HCT: 28 % — ABNORMAL LOW (ref 39.0–52.0)
Hemoglobin: 8.6 g/dL — ABNORMAL LOW (ref 13.0–17.0)

## 2019-10-13 MED ORDER — OXYCODONE HCL 5 MG PO TABS
5.0000 mg | ORAL_TABLET | ORAL | Status: DC | PRN
  Administered 2019-10-14 – 2019-10-17 (×3): 5 mg via ORAL
  Filled 2019-10-13 (×3): qty 1

## 2019-10-13 MED ORDER — PREDNISONE 20 MG PO TABS
70.0000 mg | ORAL_TABLET | Freq: Every day | ORAL | Status: DC
  Administered 2019-10-14 – 2019-10-17 (×2): 70 mg via ORAL
  Filled 2019-10-13 (×4): qty 1

## 2019-10-13 MED ORDER — ACETAMINOPHEN 325 MG PO TABS: 650.0000 mg | ORAL_TABLET | Freq: Four times a day (QID) | ORAL | Status: DC | PRN

## 2019-10-13 MED ORDER — ONDANSETRON HCL 4 MG PO TABS: 4.0000 mg | ORAL_TABLET | Freq: Four times a day (QID) | ORAL | Status: DC | PRN

## 2019-10-13 MED ORDER — FENTANYL CITRATE (PF) 100 MCG/2ML IJ SOLN
12.5000 ug | INTRAMUSCULAR | Status: DC | PRN
  Administered 2019-10-13: 12.5 ug via INTRAVENOUS
  Filled 2019-10-13 (×2): qty 2

## 2019-10-13 MED ORDER — POLYVINYL ALCOHOL 1.4 % OP SOLN
1.0000 [drp] | Freq: Four times a day (QID) | OPHTHALMIC | Status: DC
  Administered 2019-10-13 – 2019-10-17 (×13): 1 [drp] via OPHTHALMIC
  Filled 2019-10-13 (×3): qty 15

## 2019-10-13 MED ORDER — MUPIROCIN 2 % EX OINT
1.0000 "application " | TOPICAL_OINTMENT | Freq: Three times a day (TID) | CUTANEOUS | Status: DC
  Administered 2019-10-13 – 2019-10-17 (×10): 1 via TOPICAL
  Filled 2019-10-13 (×3): qty 22

## 2019-10-13 MED ORDER — NYSTATIN 100000 UNIT/GM EX OINT
1.0000 "application " | TOPICAL_OINTMENT | Freq: Every day | CUTANEOUS | Status: DC | PRN
  Filled 2019-10-13: qty 15

## 2019-10-13 MED ORDER — SULFAMETHOXAZOLE-TRIMETHOPRIM 800-160 MG PO TABS
0.5000 | ORAL_TABLET | ORAL | Status: DC
  Administered 2019-10-17: 0.5 via ORAL
  Filled 2019-10-13 (×2): qty 1

## 2019-10-13 MED ORDER — SODIUM CHLORIDE 0.9 % IV SOLN: INTRAVENOUS | Status: AC

## 2019-10-13 MED ORDER — SODIUM CHLORIDE 0.9 % IV SOLN: INTRAVENOUS | Status: DC

## 2019-10-13 MED ORDER — SULFAMETHOXAZOLE-TRIMETHOPRIM 400-80 MG PO TABS
1.0000 | ORAL_TABLET | ORAL | Status: DC
  Filled 2019-10-13: qty 1

## 2019-10-13 MED ORDER — TRIAMCINOLONE ACETONIDE 0.1 % EX CREA
1.0000 "application " | TOPICAL_CREAM | Freq: Two times a day (BID) | CUTANEOUS | Status: DC
  Administered 2019-10-14 – 2019-10-17 (×7): 1 via TOPICAL
  Filled 2019-10-13 (×2): qty 15

## 2019-10-13 MED ORDER — MIDODRINE HCL 5 MG PO TABS
5.0000 mg | ORAL_TABLET | ORAL | Status: DC
  Filled 2019-10-13: qty 1

## 2019-10-13 MED ORDER — MORPHINE SULFATE (PF) 4 MG/ML IV SOLN
4.0000 mg | Freq: Once | INTRAVENOUS | Status: AC
  Administered 2019-10-13: 4 mg via INTRAMUSCULAR
  Filled 2019-10-13: qty 1

## 2019-10-13 MED ORDER — CHLORHEXIDINE GLUCONATE CLOTH 2 % EX PADS
6.0000 | MEDICATED_PAD | Freq: Every day | CUTANEOUS | Status: DC
  Administered 2019-10-16: 6 via TOPICAL

## 2019-10-13 MED ORDER — SODIUM CHLORIDE 0.9% FLUSH: 10.0000 mL | INTRAVENOUS | Status: DC | PRN

## 2019-10-13 MED ORDER — ONDANSETRON HCL 4 MG/2ML IJ SOLN: 4.0000 mg | Freq: Four times a day (QID) | INTRAMUSCULAR | Status: DC | PRN

## 2019-10-13 MED ORDER — ONDANSETRON 4 MG PO TBDP
4.0000 mg | ORAL_TABLET | Freq: Once | ORAL | Status: AC
  Administered 2019-10-13: 4 mg via ORAL
  Filled 2019-10-13: qty 1

## 2019-10-13 MED ORDER — MIDODRINE HCL 5 MG PO TABS
10.0000 mg | ORAL_TABLET | Freq: Two times a day (BID) | ORAL | Status: DC
  Administered 2019-10-14 – 2019-10-17 (×6): 10 mg via ORAL
  Filled 2019-10-13 (×12): qty 2

## 2019-10-13 MED ORDER — ACETAMINOPHEN 650 MG RE SUPP: 650.0000 mg | Freq: Four times a day (QID) | RECTAL | Status: DC | PRN

## 2019-10-13 MED ORDER — SODIUM CHLORIDE 0.9% FLUSH
10.0000 mL | Freq: Two times a day (BID) | INTRAVENOUS | Status: DC
  Administered 2019-10-14 – 2019-10-16 (×5): 10 mL

## 2019-10-13 NOTE — ED Notes (Signed)
Awaiting IV team for IV access.

## 2019-10-13 NOTE — ED Notes (Signed)
Verbal order given to draw labs from Hemodialysis catheter at left chest.  Waste tube drawn and line flushed with NS.

## 2019-10-13 NOTE — ED Notes (Signed)
Dr Alvino Chapel attempted twice via u/s without success.  IV team called.

## 2019-10-13 NOTE — Progress Notes (Addendum)
Called for ESRD pt w/ rectal bleeding, recent SJS mostly resolved on po prednisone x 4 wks. Per primary MD pt is hemodynamically stable. Labs reviewed, no sig issues. Pt on TTS HD, we will plan on HD later today then one of [place] will see the patient in the morning.    Kelly Splinter, MD 10/13/2019, 3:11 PM   D. [facility]  TTS 4h  400   69kg  2/2.25 bath  TDC  Hep none (heparin allergy)  - mircera 225 q2 last 8/03   - venofer 50 /wk

## 2019-10-13 NOTE — ED Notes (Signed)
Have called and attempted several times with Octavia Bruckner (superivor) and called [phone] and [phone] to contact IV team.  Informed hospitalist, IV access needed.

## 2019-10-13 NOTE — Progress Notes (Signed)
For any questions, please contact patients son Brody Bonneau [phone].

## 2019-10-13 NOTE — Consult Note (Signed)
[facility] Consult  Reason for Consult: Left inguinal hernia  Referring Physician:  Dr. Starla Link   Chief Complaint    Rectal Bleeding      HPI: Jesse Murphy is a 86 y.o. 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 He noticed some blood in his stool this AM and had some lower abdominal pain.  He is having some cramping and pain in the rectal area per his report. He was evaluated in the ED and found to have a reducible inguinal hernia by Dr. Alvino Chapel and had a CT scan that demonstrates a hernia that is non-obstructing with some sigmoid colon in the hernia sac and no signs of any compromised bowel.    He says he had a colonoscopy 5 years ago that was normal. He has no colon cancer in his family. He says that he had a right inguinal hernia repair in the past. He denies any pain or obstructive symptoms from the left inguinal hernia and says it has not bothered him.   Past Medical History:  Diagnosis Date  . Anemia   . CAD (coronary artery disease)    a. s/p PCI in 1992 b. Coronary CT in 01/2018 showing extensive coronary calcification; FFR indeterminate --> medical management pursued at that time as patient not interested in repeat cath. c. s/p CABGx3 in 02/2019.  . Cancer [facility])    prostate  . Cardiogenic shock ([place])   . CHB (complete heart block) (HCC)    a. due to vagal event in 2021, requiring temp pacer.  . Chronic combined systolic and diastolic CHF (congestive heart failure) ([place])   . Debilitated   . Dyspnea   . End stage renal disease ([facility])   . Esophagitis   . H/O tracheostomy    a. during admit in 2021 for respiratory failure.  . Hematuria   . Hypertension   . Hypoalbuminemia   . Ischemic cardiomyopathy   . Myocardial infarction ([place])   . PAF (paroxysmal atrial fibrillation) ([place])   . Pleural  effusion, left    a. s/p pleurX catheter  . Pseudomonas pneumonia ([place])   . Thrombocytopenia ([place])     Past Surgical History:  Procedure Laterality Date  . BASCILIC VEIN TRANSPOSITION Right 09/10/2019   Procedure: RIGHT ARM 1ST STAGE BASCILIC VEIN TRANSPOSITION;  Surgeon: Angelia Mould, MD;  Location: [place];  Service: Vascular;  Laterality: Right;  . CHEST TUBE INSERTION Left 04/23/2019   Procedure: INSERTION PLEURAL DRAINAGE CATHETER TO DRAIN LEFT PLEURAL EFFUSION;  Surgeon: Ivin Poot, MD;  Location: [place];  Service: Thoracic;  Laterality: Left;  . CLIPPING OF ATRIAL APPENDAGE N/A 03/16/2019   Procedure: CLIPPING OF LEFT  ATRIAL APPENDAGE - USING ATRICLIP SIZE 40;  Surgeon: Ivin Poot, MD;  Location: [place];  Service: Open Heart Surgery;  Laterality: N/A;  . COLONOSCOPY N/A 08/25/2017   Procedure: COLONOSCOPY;  Surgeon: Rogene Houston, MD;  [facility];  Service: Endoscopy;  Laterality: N/A;  . CORONARY ARTERY BYPASS GRAFT N/A 03/16/2019   Procedure: CORONARY ARTERY BYPASS GRAFTING (CABG), ON PUMP, TIMES THREE, USING LEFT INTERNAL MAMMARY ARTERY, RIGHT GREATER SAPHENOUS VEIN HARVESTED ENDOSCOPICALLY;  Surgeon: Ivin Poot, MD;  Location: [place];  Service: Open Heart Surgery;  Laterality: N/A;  swan only  . [place] N/A 04/30/2019  Procedure: CYSTOSCOPY WITH FULGERATION;  Surgeon: Irine Seal, MD;  Location: [place];  Service: Urology;  Laterality: N/A;  . HERNIA REPAIR    . IR FLUORO GUIDE CV LINE LEFT  04/23/2019  . IR FLUORO GUIDE CV LINE RIGHT  05/24/2019  . IR US GUIDE VASC ACCESS LEFT  04/23/2019  . MAZE N/A 03/16/2019   Procedure: MAZE;  Surgeon: Ivin Poot, MD;  Location: [place];  Service: Open Heart Surgery;  Laterality: N/A;  . PLACEMENT OF IMPELLA LEFT VENTRICULAR ASSIST DEVICE Right 03/27/2019   Procedure: Placement Of Impella Left Ventricular Assist Device using ABIOMED Impella 5.5 with SmartAssist Device.;  Surgeon: Wonda Olds, MD;  [facility];  Service: Thoracic;  Laterality: Right;  . PROSTATE SURGERY    . REMOVAL OF IMPELLA LEFT VENTRICULAR ASSIST DEVICE N/A 04/10/2019   Procedure: REMOVAL OF IMPELLA LEFT VENTRICULAR ASSIST DEVICE WITH INSERTION OF RIGHT FEMORAL ARTERIAL LINE;  Surgeon: Ivin Poot, MD;  Location: [place];  Service: Open Heart Surgery;  Laterality: N/A;  . REMOVAL OF PLEURAL DRAINAGE CATHETER Left 07/02/2019   Procedure: REMOVAL OF PLEURAL DRAINAGE CATHETER;  Surgeon: Ivin Poot, MD;  Location: [place];  Service: Thoracic;  Laterality: Left;  . RIGHT/LEFT HEART CATH AND CORONARY ANGIOGRAPHY N/A 03/15/2019   Procedure: RIGHT/LEFT HEART CATH AND CORONARY ANGIOGRAPHY;  Surgeon: Burnell Blanks, MD;  Location: [place] [facility];  Service: Cardiovascular;  Laterality: N/A;  . TEE WITHOUT CARDIOVERSION N/A 03/16/2019   Procedure: TRANSESOPHAGEAL ECHOCARDIOGRAM (TEE);  Surgeon: Prescott Gum, Collier Salina, MD;  Location: [place];  Service: Open Heart Surgery;  Laterality: N/A;  . TRACHEOSTOMY TUBE PLACEMENT N/A 03/27/2019   Procedure: TRACHEOSTOMY placed using [place]iley 8DCT Cuffed.;  Surgeon: Wonda Olds, MD;  [facility];  Service: Thoracic;  Laterality: N/A;  . TRANSURETHRAL RESECTION OF BLADDER NECK N/A 05/13/2019   Procedure: CYSTOSCOPY CLOT EVACUATION FULGRATION CYSTOGRAM AND INSTILLATION OF FORMALIN;  Surgeon: Lucas Mallow, MD;  Location: [place];  Service: Urology;  Laterality: N/A;    Family History  Problem Relation Age of Onset  . CAD Mother 20  . CAD Father 7  . CAD Sister   . CAD Brother     Social History   Tobacco Use  . Smoking status: Former Smoker    Packs/day: 0.25    Years: 50.00    Pack years: 12.50    Types: Cigarettes    Quit date: 02/15/1990    Years since quitting: 29.6  . Smokeless tobacco: Never Used  Vaping Use  . Vaping Use: Never used  Substance Use Topics  . Alcohol use: No  . Drug use: No    Medications: I have reviewed the  patient's current medications. Current Facility-Administered Medications  Medication Dose Route Frequency Provider Last Rate Last Admin  . 0.9 %  sodium chloride infusion   Intravenous Continuous Alekh, Kshitiz, MD      . acetaminophen (TYLENOL) tablet 650 mg  650 mg Oral Q6H PRN Aline August, MD       Or  . acetaminophen (TYLENOL) suppository 650 mg  650 mg Rectal Q6H PRN Alekh, Kshitiz, MD      . fentaNYL (SUBLIMAZE) injection 12.5 mcg  12.5 mcg Intravenous Q2H PRN Alekh, Kshitiz, MD      . midodrine (PROAMATINE) tablet 10 mg  10 mg Oral BID WC Alekh, Kshitiz, MD      . Derrill Memo ON 10/15/2019] midodrine (PROAMATINE) tablet 5 mg  5 mg Oral  Q M,W,F-HD Aline August, MD      . mupirocin ointment (BACTROBAN) 2 % 1 application  1 application Topical TID Starla Link, Kshitiz, MD      . nystatin ointment (MYCOSTATIN) 1 application  1 application Topical Daily PRN Starla Link, Kshitiz, MD      . ondansetron (ZOFRAN) tablet 4 mg  4 mg Oral Q6H PRN Starla Link, Kshitiz, MD       Or  . ondansetron (ZOFRAN) injection 4 mg  4 mg Intravenous Q6H PRN Alekh, Kshitiz, MD      . oxyCODONE (Oxy IR/ROXICODONE) immediate release tablet 5 mg  5 mg Oral Q4H PRN Alekh, Kshitiz, MD      . polyvinyl alcohol (LIQUIFILM TEARS) 1.4 % ophthalmic solution 1 drop  1 drop Both Eyes QID Starla Link, Kshitiz, MD      . Derrill Memo ON 10/14/2019] predniSONE (DELTASONE) tablet 70 mg  70 mg Oral Q breakfast Alekh, Kshitiz, MD      . sodium chloride flush (NS) 0.9 % injection 10-40 mL  10-40 mL Intracatheter Q12H Davonna Belling, MD      . sodium chloride flush (NS) 0.9 % injection 10-40 mL  10-40 mL Intracatheter PRN Davonna Belling, MD      . Derrill Memo ON 10/15/2019] sulfamethoxazole-trimethoprim (BACTRIM) 400-80 MG per tablet 1 tablet  1 tablet Oral Once per day on Mon Wed Fri Alekh, Kshitiz, MD      . triamcinolone cream (KENALOG) 0.1 % 1 application  1 application Topical BID Aline August, MD       Current Outpatient Medications  Medication Sig  Dispense Refill Last Dose  . acetaminophen (TYLENOL) 500 MG tablet Take 500 mg by mouth every 6 (six) hours as needed for moderate pain.     Marland Kitchen aspirin 81 MG chewable tablet Chew 1 tablet by mouth in the morning.   10/12/2019 at Unknown time  . Calcium Carbonate-Vitamin D 600-400 MG-UNIT tablet Take 1 tablet by mouth daily.   10/12/2019 at Unknown time  . carboxymethylcellulose (REFRESH PLUS) 0.5 % SOLN Place 1 drop into both eyes 4 (four) times daily.   10/12/2019 at Unknown time  . midodrine (PROAMATINE) 10 MG tablet Take 1 tablet by mouth 2 (two) times daily.   10/12/2019 at Unknown time  . midodrine (PROAMATINE) 5 MG tablet Take 1 tablet by mouth 3 (three) times a week. Monday, Wednesday, Friday   10/12/2019 at Unknown time  . mupirocin ointment (BACTROBAN) 2 % Apply 1 application topically in the morning, at noon, and at bedtime. For 7 days. Apply to "burn wounds" all over his body [place]. Please DO NOT apply to face.   10/12/2019 at Unknown time  . nystatin ointment (MYCOSTATIN) Apply 1 application topically daily as needed for itching. Apply to Penis as needed if itching occurs.     . predniSONE (DELTASONE) 10 MG tablet Take 7 tablets by mouth daily.   10/13/2019 at Unknown time  . senna (SENOKOT) 8.6 MG tablet Take 1 tablet by mouth as needed.     . sulfamethoxazole-trimethoprim (BACTRIM) 400-80 MG tablet Take 1 tablet by mouth 3 (three) times a week. Monday, Wednesday, Friday   10/12/2019 at Unknown time  . triamcinolone cream (KENALOG) 0.1 % Apply 1 application topically 2 (two) times daily. Apply to skin that is intact. DO NOT apply to "burn wounds".   10/12/2019 at Unknown time    Allergies  Allergen Reactions  . Atorvastatin Anaphylaxis and Rash    Leg pain  . Midodrine Hcl Rash  Severe sloughing body skin rash 3 days after starting midodrine in Aug 2021  . Amiodarone     Rash (see 05/2019 hospital notes), but was on other medications at the time so unclear which agent   .  Heparin Other (See Comments)    Thrombocytopenia/ HIT  . Reglan [Metoclopramide]     Rash (see 05/2019 hospital notes), but was on other medications at the time so unclear which agent     ROS:  A comprehensive review of systems was negative except for: Gastrointestinal: positive for rectal bleeding, lower abdominal pain, pain in his rectum  Blood pressure 117/61, pulse 88, temperature 97.7 F (36.5 C), temperature source Oral, resp. rate 18, SpO2 98 %. Physical Exam Vitals reviewed.  HENT:     Head:     Comments: Flaking of skin on face and upper body Eyes:     Extraocular Movements: Extraocular movements intact.  Cardiovascular:     Rate and Rhythm: Normal rate.  Pulmonary:     Effort: Pulmonary effort is normal.  Abdominal:     General: There is no distension.     Palpations: Abdomen is soft.     Tenderness: There is abdominal tenderness in the suprapubic area.     Hernia: A hernia is present. Hernia is present in the left inguinal area.     Comments: Minor lower abdominal tenderness with palpation; reducible hernia, nontedner patient reports, he did act like he was hurting during the exam but he said his rectum was hurting him not the reduction of the hernia; I did not perform a rectal exam as he was all bundled up and GI will be seeing him later for the rectal bleeding   Skin:    General: Skin is warm.  Neurological:     Mental Status: He is alert.  Psychiatric:        Mood and Affect: Mood normal.        Behavior: Behavior normal.     Results: Results for orders placed or performed during the hospital encounter of 10/13/19 (from the past 48 hour(s))  Comprehensive metabolic panel     Status: Abnormal   Collection Time: 10/13/19 10:15 AM  Result Value Ref Range   Sodium 134 (L) 135 - 145 mmol/L   Potassium 4.4 3.5 - 5.1 mmol/L   Chloride 96 (L) 98 - 111 mmol/L   CO2 26 22 - 32 mmol/L   Glucose, Bld 150 (H) 70 - 99 mg/dL    Comment: Glucose reference range applies  only to samples taken after fasting for at least 8 hours.   BUN 68 (H) 8 - 23 mg/dL   Creatinine, Ser 4.05 (H) 0.61 - 1.24 mg/dL   Calcium 8.0 (L) 8.9 - 10.3 mg/dL   Total Protein 4.7 (L) 6.5 - 8.1 g/dL   Albumin 2.2 (L) 3.5 - 5.0 g/dL   AST 21 15 - 41 U/L   ALT 48 (H) 0 - 44 U/L   Alkaline Phosphatase 58 38 - 126 U/L   Total Bilirubin 0.6 0.3 - 1.2 mg/dL   GFR calc non Af Amer 12 (L) >60 mL/min   GFR calc Af Amer 14 (L) >60 mL/min   Anion gap 12 5 - 15    Comment: Performed at [facility], [address] [place],  [address]  CBC WITH DIFFERENTIAL     Status: Abnormal   Collection Time: 10/13/19 10:15 AM  Result Value Ref Range   WBC 15.7 (H) 4.0 - 10.5  K/uL   RBC 2.79 (L) 4.22 - 5.81 MIL/uL   Hemoglobin 9.0 (L) 13.0 - 17.0 g/dL   HCT 29.1 (L) 39 - 52 %   MCV 104.3 (H) 80.0 - 100.0 fL   MCH 32.3 26.0 - 34.0 pg   MCHC 30.9 30.0 - 36.0 g/dL   RDW 17.6 (H) 11.5 - 15.5 %   Platelets 258 150 - 400 K/uL   nRBC 0.0 0.0 - 0.2 %   Neutrophils Relative % 90 %   Neutro Abs 14.2 (H) 1.7 - 7.7 K/uL   Lymphocytes Relative 3 %   Lymphs Abs 0.4 (L) 0.7 - 4.0 K/uL   Monocytes Relative 5 %   Monocytes Absolute 0.7 0 - 1 K/uL   Eosinophils Relative 0 %   Eosinophils Absolute 0.0 0 - 0 K/uL   Basophils Relative 0 %   Basophils Absolute 0.0 0 - 0 K/uL   Immature Granulocytes 2 %   Abs Immature Granulocytes 0.35 (H) 0.00 - 0.07 K/uL    Comment: Performed at [facility], [address] [place], [place] [address]  Protime-INR     Status: None   Collection Time: 10/13/19 10:15 AM  Result Value Ref Range   Prothrombin Time 13.7 11.4 - 15.2 seconds   INR 1.1 0.8 - 1.2    Comment: (NOTE) INR goal varies based on device and disease states. Performed at [facility], [address], [place], [place] [address]   Type and screen [facility]     Status: None   Collection Time: 10/13/19 10:15 AM  Result Value Ref Range   ABO/RH(D) B POS    Antibody Screen NEG    Sample Expiration       10/16/2019,2359 Performed at [facility], [address] [place], [place] [address]   SARS Coronavirus 2 by RT PCR (hospital order, performed in [facility] hospital lab) Nasopharyngeal Nasopharyngeal Swab     Status: None   Collection Time: 10/13/19 10:19 AM   Specimen: Nasopharyngeal Swab  Result Value Ref Range   SARS Coronavirus 2 NEGATIVE NEGATIVE    Comment: (NOTE) SARS-CoV-2 target nucleic acids are NOT DETECTED.  The SARS-CoV-2 RNA is generally detectable in upper and lower respiratory specimens during the acute phase of infection. The lowest concentration of SARS-CoV-2 viral copies this assay can detect is 250 copies / mL. A negative result does not preclude SARS-CoV-2 infection and should not be used as the sole basis for treatment or other patient management decisions.  A negative result may occur with improper specimen collection / handling, submission of specimen other than nasopharyngeal swab, presence of viral mutation(s) within the areas targeted by this assay, and inadequate number of viral copies (<250 copies / mL). A negative result must be combined with clinical observations, patient history, and epidemiological information.  Fact Sheet for Patients:   StrictlyIdeas.no  Fact Sheet for Healthcare Providers: BankingDealers.co.za  This test is not yet approved or  cleared by the [place] FDA and has been authorized for detection and/or diagnosis of SARS-CoV-2 by FDA under an Emergency Use Authorization (EUA).  This EUA will remain in effect (meaning this test can be used) for the duration of the COVID-19 declaration under Section 564(b)(1) of the Act, 21 U.S.C. section 360bbb-3(b)(1), unless the authorization is terminated or revoked sooner.  Performed at [facility], [address], [place], [place] [address]   ABO/Rh     Status: None   Collection Time: 10/13/19 11:06 AM  Result Value  Ref Range    ABO/RH(D)      B POS Performed at [facility], [address], [place], [place] [address]    Personally reviewed- large left sided hernia with sigmoid colon, non obstructing, no signs of wall thickening or concerning feature of the bowel  CT ABDOMEN PELVIS WO CONTRAST  Result Date: 10/13/2019 CLINICAL DATA:  GI bleed. Jelly like blood in underwear this morning. Diffuse pain. EXAM: CT ABDOMEN AND PELVIS WITHOUT CONTRAST TECHNIQUE: Multidetector CT imaging of the abdomen and pelvis was performed following the standard protocol without IV contrast. COMPARISON:  04/13/2019 FINDINGS: Lower chest: Bilateral pleural effusions are noted, left greater than right. Hepatobiliary: No focal liver abnormality is seen. No gallstones, gallbladder wall thickening, or biliary dilatation. Pancreas: Unremarkable. No pancreatic ductal dilatation or surrounding inflammatory changes. Spleen: Normal in size without focal abnormality. Adrenals/Urinary Tract: Normal appearance of the adrenal glands. Small bilateral kidney stones are noted. The largest is in the upper pole of left kidney measuring 4 mm. No hydronephrosis identified bilaterally. Small focus of gas is noted within the urinary bladder. No focal bladder abnormality noted. Stomach/Bowel: The stomach appears normal. The appendix is visualized and appears normal. No bowel wall thickening, inflammation, or distension. There is a large left inguinal hernia containing a nonobstructed loop of sigmoid colon. Moderate hyperdense stool is identified within the colon and rectum. Vascular/Lymphatic: Aortic atherosclerosis. No aneurysm. No abdominopelvic adenopathy. Reproductive: Seed implants are noted within the prostate gland. Large right hydrocele noted, image 85/2. Other: Trace volume of free fluid noted within the abdomen and pelvis. No focal fluid collections identified. Musculoskeletal: Multilevel degenerative disc disease is noted within the lumbar spine. No acute or  suspicious osseous findings. IMPRESSION: 1. No acute findings identified within the abdomen or pelvis. 2. Large left inguinal hernia containing a nonobstructed loop of sigmoid colon. 3. Large right hydrocele. 4. Bilateral pleural effusions, left greater than right. 5. Small focus of gas noted within the urinary bladder. Correlate for any clinical signs or symptoms of cystitis. 6. Aortic atherosclerosis. Aortic Atherosclerosis (ICD10-I70.0). Electronically Signed   By: Kerby Moors M.D.   On: 10/13/2019 12:04   DG Chest Portable 1 View  Result Date: 10/13/2019 CLINICAL DATA:  Weakness.  Rectal bleeding. EXAM: PORTABLE CHEST 1 VIEW COMPARISON:  08/22/2019 FINDINGS: Previous median sternotomy with CABG procedure and left atrial clip placement. There is a left chest wall dialysis catheter with tips at the cavoatrial junction. Normal heart size. Persistent small left pleural effusion identified with pulmonary vascular congestion. No airspace opacities or frank pulmonary edema. Visualized osseous structures are unremarkable. IMPRESSION: Pulmonary vascular congestion and small left pleural effusion. Electronically Signed   By: Kerby Moors M.D.   On: 10/13/2019 11:38     Assessment & Plan:  DAMIN SALIDO is a 86 y.o. male with rectal bleeding and some rectal pain per his report. Again deferred exam as GI will be doing this shortly and patient bundled up.  He has a reducible left inguinal hernia that is non obstructing on CT. He has extensive medical issues, recent CABG, pemphigus vulgaris that is being treated at [facility].  He does not appear to be symptomatic from the left inguinal hernia and based on his co-morbidities I do not think anyone wound offer him a repair at this time.   -If he were to need surgery or inguinal hernia repair, he would need to be transferred to a facility where cardiology, critical care, etc were present.  -No acute surgical intervention, does  not need to follow up with me as an  outpatient  All questions were answered to the satisfaction of the patient.   Virl Cagey 10/13/2019, 2:26 PM

## 2019-10-13 NOTE — H&P (Addendum)
History and Physical    Jesse Murphy PFX:[id number] DOB: 11/12/1928 DOA: 10/13/2019  PCP: Leeanne Rio, MD   Patient coming from: Home  I have personally briefly reviewed patient's old medical records in [place]  Chief Complaint: Rectal bleeding  HPI: Jesse Murphy is a 86 y.o. male with medical history significant of end-stage renal disease on hemodialysis, paroxysmal A. fib, CAD/MI/ischemic cardiomyopathy/history of CABG in January 2021, history of tracheostomy temporarily, chronic, systolic and diastolic heart failure, complete heart block requiring temporary pacemaker, prostate cancer, recent diagnosis of pemphigus vulgaris  causing diffuse skin peeling/rash for which he was initially hospitalized at [facility] on 10/01/2019 but was subsequently transferred to [facility] where he received a diagnosis and was discharged on oral prednisone presented today with rectal bleeding.  Patient is a poor historian but states that he was passing clots this morning.  As per the patient's family on phone, they had noticed some blood few days ago when he was being cleaned.  He presented to the ED today because he was passing clots.  Also complains of lower abdominal and rectal bleeding, states that he feels there is a twisting in his rectal area and the pain comes and goes.  He has had intermittent cramping but has gotten worse today.  He denies any fever, chest pain, vomiting.  He was supposed to have dialysis today but missed it and states that he does not want to have dialysis today.  Denies worsening shortness of breath.  Feels weak.  Complains of nausea.  ED Course: Hemoglobin was found to be 9.  CT of abdomen showed no acute findings identified within the abdomen or pelvis; large left inguinal hernia containing a nonobstructed loop of sigmoid colon; large right hydrocele. Hospitalist service was called to evaluate the patient.  Review of Systems: Very poor historian.  Full review of  systems was very difficult to obtain.  Past Medical History:  Diagnosis Date  . Anemia   . CAD (coronary artery disease)    a. s/p PCI in 1992 b. Coronary CT in 01/2018 showing extensive coronary calcification; FFR indeterminate --> medical management pursued at that time as patient not interested in repeat cath. c. s/p CABGx3 in 02/2019.  . Cancer [facility])    prostate  . Cardiogenic shock ([place])   . CHB (complete heart block) (HCC)    a. due to vagal event in 2021, requiring temp pacer.  . Chronic combined systolic and diastolic CHF (congestive heart failure) ([place])   . Debilitated   . Dyspnea   . End stage renal disease ([place])   . Esophagitis   . H/O tracheostomy    a. during admit in 2021 for respiratory failure.  . Hematuria   . Hypertension   . Hypoalbuminemia   . Ischemic cardiomyopathy   . Myocardial infarction ([place])   . PAF (paroxysmal atrial fibrillation) ([place])   . Pleural effusion, left    a. s/p pleurX catheter  . Pseudomonas pneumonia ([place])   . Thrombocytopenia ([place])     Past Surgical History:  Procedure Laterality Date  . BASCILIC VEIN TRANSPOSITION Right 09/10/2019   Procedure: RIGHT ARM 1ST STAGE BASCILIC VEIN TRANSPOSITION;  Surgeon: Angelia Mould, MD;  Location: [place];  Service: Vascular;  Laterality: Right;  . CHEST TUBE INSERTION Left 04/23/2019   Procedure: INSERTION PLEURAL DRAINAGE CATHETER TO DRAIN LEFT PLEURAL EFFUSION;  Surgeon: Ivin Poot, MD;  Location: [place];  Service: Thoracic;  Laterality: Left;  .  CLIPPING OF ATRIAL APPENDAGE N/A 03/16/2019   Procedure: CLIPPING OF LEFT  ATRIAL APPENDAGE - USING ATRICLIP SIZE 40;  Surgeon: Ivin Poot, MD;  Location: [place];  Service: Open Heart Surgery;  Laterality: N/A;  . COLONOSCOPY N/A 08/25/2017   Procedure: COLONOSCOPY;  Surgeon: Rogene Houston, MD;  [facility];  Service: Endoscopy;  Laterality: N/A;  . CORONARY ARTERY BYPASS GRAFT N/A 03/16/2019   Procedure: CORONARY ARTERY BYPASS  GRAFTING (CABG), ON PUMP, TIMES THREE, USING LEFT INTERNAL MAMMARY ARTERY, RIGHT GREATER SAPHENOUS VEIN HARVESTED ENDOSCOPICALLY;  Surgeon: Ivin Poot, MD;  Location: [place];  Service: Open Heart Surgery;  Laterality: N/A;  swan only  . CYSTOSCOPY WITH FULGERATION N/A 04/30/2019   Procedure: CYSTOSCOPY WITH FULGERATION;  Surgeon: Irine Seal, MD;  Location: [place];  Service: Urology;  Laterality: N/A;  . HERNIA REPAIR    . IR FLUORO GUIDE CV LINE LEFT  04/23/2019  . IR FLUORO GUIDE CV LINE RIGHT  05/24/2019  . IR US GUIDE VASC ACCESS LEFT  04/23/2019  . MAZE N/A 03/16/2019   Procedure: MAZE;  Surgeon: Ivin Poot, MD;  Location: [place];  Service: Open Heart Surgery;  Laterality: N/A;  . PLACEMENT OF IMPELLA LEFT VENTRICULAR ASSIST DEVICE Right 03/27/2019   Procedure: Placement Of Impella Left Ventricular Assist Device using ABIOMED Impella 5.5 with SmartAssist Device.;  Surgeon: Wonda Olds, MD;  [facility];  Service: Thoracic;  Laterality: Right;  . PROSTATE SURGERY    . REMOVAL OF IMPELLA LEFT VENTRICULAR ASSIST DEVICE N/A 04/10/2019   Procedure: REMOVAL OF IMPELLA LEFT VENTRICULAR ASSIST DEVICE WITH INSERTION OF RIGHT FEMORAL ARTERIAL LINE;  Surgeon: Ivin Poot, MD;  Location: [place];  Service: Open Heart Surgery;  Laterality: N/A;  . REMOVAL OF PLEURAL DRAINAGE CATHETER Left 07/02/2019   Procedure: REMOVAL OF PLEURAL DRAINAGE CATHETER;  Surgeon: Ivin Poot, MD;  Location: [place];  Service: Thoracic;  Laterality: Left;  . RIGHT/LEFT HEART CATH AND CORONARY ANGIOGRAPHY N/A 03/15/2019   Procedure: RIGHT/LEFT HEART CATH AND CORONARY ANGIOGRAPHY;  Surgeon: Burnell Blanks, MD;  Location: [place] CV LAB;  Service: Cardiovascular;  Laterality: N/A;  . TEE WITHOUT CARDIOVERSION N/A 03/16/2019   Procedure: TRANSESOPHAGEAL ECHOCARDIOGRAM (TEE);  Surgeon: Prescott Gum, Collier Salina, MD;  Location: [place];  Service: Open Heart Surgery;  Laterality: N/A;  . TRACHEOSTOMY TUBE PLACEMENT N/A  03/27/2019   Procedure: TRACHEOSTOMY placed using [place] 8DCT Cuffed.;  Surgeon: Wonda Olds, MD;  [facility];  Service: Thoracic;  Laterality: N/A;  . TRANSURETHRAL RESECTION OF BLADDER NECK N/A 05/13/2019   Procedure: CYSTOSCOPY CLOT EVACUATION FULGRATION CYSTOGRAM AND INSTILLATION OF FORMALIN;  Surgeon: Lucas Mallow, MD;  Location: [place];  Service: Urology;  Laterality: N/A;   Social history  reports that he quit smoking about 29 years ago. His smoking use included cigarettes. He has a 12.50 pack-year smoking history. He has never used smokeless tobacco. He reports that he does not drink alcohol and does not use drugs.  Lives at home with wife  Allergies  Allergen Reactions  . Atorvastatin Anaphylaxis and Rash    Leg pain  . Midodrine Hcl Rash    Severe sloughing body skin rash 3 days after starting midodrine in Aug 2021  . Amiodarone     Rash (see 05/2019 hospital notes), but was on other medications at the time so unclear which agent   . Heparin Other (See Comments)    Thrombocytopenia/ HIT  .  Reglan [Metoclopramide]     Rash (see 05/2019 hospital notes), but was on other medications at the time so unclear which agent    Family History  Problem Relation Age of Onset  . CAD Mother 26  . CAD Father 90  . CAD Sister   . CAD Brother     Prior to Admission medications   Medication Sig Start Date End Date Taking? Authorizing Provider  acetaminophen (TYLENOL) 500 MG tablet Take 500 mg by mouth every 6 (six) hours as needed for moderate pain.   Yes [provider]  aspirin 81 MG chewable tablet Chew 1 tablet by mouth in the morning. 05/21/19  Yes [provider]  Calcium Carbonate-Vitamin D 600-400 MG-UNIT tablet Take 1 tablet by mouth daily. 10/08/19 12/07/19 Yes [provider]  carboxymethylcellulose (REFRESH PLUS) 0.5 % SOLN Place 1 drop into both eyes 4 (four) times daily. 10/08/19  Yes [provider]  midodrine (PROAMATINE) 10 MG  tablet Take 1 tablet by mouth 2 (two) times daily. 10/08/19 10/07/20 Yes [provider]  midodrine (PROAMATINE) 5 MG tablet Take 1 tablet by mouth 3 (three) times a week. Monday, Wednesday, Friday 10/10/19 10/09/20 Yes [provider]  mupirocin ointment (BACTROBAN) 2 % Apply 1 application topically in the morning, at noon, and at bedtime. For 7 days. Apply to "burn wounds" all over his body [place]. Please DO NOT apply to face. 10/08/19 10/15/19 Yes [provider]  nystatin ointment (MYCOSTATIN) Apply 1 application topically daily as needed for itching. Apply to Penis as needed if itching occurs. 06/22/19  Yes [provider]  predniSONE (DELTASONE) 10 MG tablet Take 7 tablets by mouth daily. 10/09/19  Yes [provider]  senna (SENOKOT) 8.6 MG tablet Take 1 tablet by mouth as needed. 10/08/19 10/07/20 Yes [provider]  sulfamethoxazole-trimethoprim (BACTRIM) 400-80 MG tablet Take 1 tablet by mouth 3 (three) times a week. Monday, Wednesday, Friday 10/08/19 12/08/19 Yes [provider]  triamcinolone cream (KENALOG) 0.1 % Apply 1 application topically 2 (two) times daily. Apply to skin that is intact. DO NOT apply to "burn wounds". 10/08/19 10/07/20 Yes [provider]    Physical Exam: Vitals:   10/13/19 0900 10/13/19 1215 10/13/19 1230 10/13/19 1300  BP: (!) 127/54 136/62 114/63 117/61  Pulse: 86  87 88  Resp:    18  Temp:      TempSrc:      SpO2: 97%  97% 98%    Constitutional: Chronically ill looking.  Elderly gentleman lying in bed.  Very poor historian. Vitals:   10/13/19 0900 10/13/19 1215 10/13/19 1230 10/13/19 1300  BP: (!) 127/54 136/62 114/63 117/61  Pulse: 86  87 88  Resp:    18  Temp:      TempSrc:      SpO2: 97%  97% 98%   Eyes: PERRL, lids and conjunctivae normal ENMT: Mucous membranes are moist. Posterior pharynx clear of any exudate or lesions. Neck: normal, supple, no masses, no  thyromegaly Respiratory: bilateral decreased breath sounds at bases; some scattered crackles.  Normal respiratory effort. No accessory muscle use.  Cardiovascular: S1 S2 positive, rate controlled; lower extremity edema present. 2+ pedal pulses.  Abdomen: Mild lower abdominal tenderness, no masses palpated. No hepatosplenomegaly. Bowel sounds positive. ] Genitourinary: Left inguinal hernia present with mild tenderness Musculoskeletal: no clubbing / cyanosis. No joint deformity upper and lower extremities.  Skin: Scattered diffuse rash present with some resolution of discoloration  neurologic: CN  2-12 grossly intact. Moving extremities. No focal neurologic deficits.  Very poor historian.  Answers only some few questions Psychiatric: Looks anxious.  Labs on Admission: I have personally reviewed following labs and imaging studies  CBC: Recent Labs  Lab 10/13/19 1015  WBC 15.7*  NEUTROABS 14.2*  HGB 9.0*  HCT 29.1*  MCV 104.3*  PLT 798   Basic Metabolic Panel: Recent Labs  Lab 10/13/19 1015  NA 134*  K 4.4  CL 96*  CO2 26  GLUCOSE 150*  BUN 68*  CREATININE 4.05*  CALCIUM 8.0*   GFR: Estimated Creatinine Clearance: 12.2 mL/min (A) (by C-G formula based on SCr of 4.05 mg/dL (H)). Liver Function Tests: Recent Labs  Lab 10/13/19 1015  AST 21  ALT 48*  ALKPHOS 58  BILITOT 0.6  PROT 4.7*  ALBUMIN 2.2*   No results for input(s): LIPASE, AMYLASE in the last 168 hours. No results for input(s): AMMONIA in the last 168 hours. Coagulation Profile: Recent Labs  Lab 10/13/19 1015  INR 1.1   Cardiac Enzymes: No results for input(s): CKTOTAL, CKMB, CKMBINDEX, TROPONINI in the last 168 hours. BNP (last 3 results) No results for input(s): PROBNP in the last 8760 hours. HbA1C: No results for input(s): HGBA1C in the last 72 hours. CBG: No results for input(s): GLUCAP in the last 168 hours. Lipid Profile: No results for input(s): CHOL, HDL, LDLCALC, TRIG, CHOLHDL, LDLDIRECT in  the last 72 hours. Thyroid Function Tests: No results for input(s): TSH, T4TOTAL, FREET4, T3FREE, THYROIDAB in the last 72 hours. Anemia Panel: No results for input(s): VITAMINB12, FOLATE, FERRITIN, TIBC, IRON, RETICCTPCT in the last 72 hours. Urine analysis:    Component Value Date/Time   COLORURINE AMBER (A) 06/22/2019 1830   APPEARANCEUR CLOUDY (A) 06/22/2019 1830   LABSPEC 1.020 06/22/2019 1830   PHURINE 6.5 06/22/2019 1830   GLUCOSEU 100 (A) 06/22/2019 1830   HGBUR LARGE (A) 06/22/2019 1830   BILIRUBINUR MODERATE (A) 06/22/2019 1830   KETONESUR TRACE (A) 06/22/2019 1830   PROTEINUR >300 (A) 06/22/2019 1830   NITRITE POSITIVE (A) 06/22/2019 1830   LEUKOCYTESUR LARGE (A) 06/22/2019 1830    Radiological Exams on Admission: CT ABDOMEN PELVIS WO CONTRAST  Result Date: 10/13/2019 CLINICAL DATA:  GI bleed. Jelly like blood in underwear this morning. Diffuse pain. EXAM: CT ABDOMEN AND PELVIS WITHOUT CONTRAST TECHNIQUE: Multidetector CT imaging of the abdomen and pelvis was performed following the standard protocol without IV contrast. COMPARISON:  04/13/2019 FINDINGS: Lower chest: Bilateral pleural effusions are noted, left greater than right. Hepatobiliary: No focal liver abnormality is seen. No gallstones, gallbladder wall thickening, or biliary dilatation. Pancreas: Unremarkable. No pancreatic ductal dilatation or surrounding inflammatory changes. Spleen: Normal in size without focal abnormality. Adrenals/Urinary Tract: Normal appearance of the adrenal glands. Small bilateral kidney stones are noted. The largest is in the upper pole of left kidney measuring 4 mm. No hydronephrosis identified bilaterally. Small focus of gas is noted within the urinary bladder. No focal bladder abnormality noted. Stomach/Bowel: The stomach appears normal. The appendix is visualized and appears normal. No bowel wall thickening, inflammation, or distension. There is a large left inguinal hernia containing a  nonobstructed loop of sigmoid colon. Moderate hyperdense stool is identified within the colon and rectum. Vascular/Lymphatic: Aortic atherosclerosis. No aneurysm. No abdominopelvic adenopathy. Reproductive: Seed implants are noted within the prostate gland. Large right hydrocele noted, image 85/2. Other: Trace volume of free fluid noted within the abdomen and pelvis. No focal fluid collections identified. Musculoskeletal: Multilevel degenerative disc  disease is noted within the lumbar spine. No acute or suspicious osseous findings. IMPRESSION: 1. No acute findings identified within the abdomen or pelvis. 2. Large left inguinal hernia containing a nonobstructed loop of sigmoid colon. 3. Large right hydrocele. 4. Bilateral pleural effusions, left greater than right. 5. Small focus of gas noted within the urinary bladder. Correlate for any clinical signs or symptoms of cystitis. 6. Aortic atherosclerosis. Aortic Atherosclerosis (ICD10-I70.0). Electronically Signed   By: Kerby Moors M.D.   On: 10/13/2019 12:04   DG Chest Portable 1 View  Result Date: 10/13/2019 CLINICAL DATA:  Weakness.  Rectal bleeding. EXAM: PORTABLE CHEST 1 VIEW COMPARISON:  08/22/2019 FINDINGS: Previous median sternotomy with CABG procedure and left atrial clip placement. There is a left chest wall dialysis catheter with tips at the cavoatrial junction. Normal heart size. Persistent small left pleural effusion identified with pulmonary vascular congestion. No airspace opacities or frank pulmonary edema. Visualized osseous structures are unremarkable. IMPRESSION: Pulmonary vascular congestion and small left pleural effusion. Electronically Signed   By: Kerby Moors M.D.   On: 10/13/2019 11:38    Assessment/Plan  Rectal bleeding Anemia of chronic disease: From renal failure -Most likely lower GI bleeding from diverticulosis/AVM.  Last colonoscopy in 2019 had shown diverticulosis/AVM -Hemoglobin 9 on presentation.  Not much below his  baseline.  Monitor H&H. -GI has been consulted: Dr. Gala Romney -Aspirin held. -gentle hydration  End-stage renal disease on hemodialysis -Missed his dialysis today.  His dialysis schedule is Tuesday Thursday and Saturday.  Notified nephrology/Dr. Jonnie Finner.  Currently does not look volume overloaded  Recent diagnosis of?  Pemphigus vulgaris -Currently being treated with oral prednisone and 3 times a week Bactrim as per his recent hospitalization at [facility].  He has a follow-up appointment as an outpatient at [facility] to the patient's son who states that he is on prednisone for 4 weeks.  Currently on prednisone 70 mg daily which will be continued.  Leukocytosis -Most likely from recent steroid use.  Monitor  Large left inguinal hernia with nonobstructed loop of sigmoid colon -Seen on CAT scan.  Patient mildly tender in the inguinal region.  Consulted general surgery/Dr. Constance Haw  Hypotension -History of chronic hypotension.  Continue midodrine -Monitor blood pressure  History of CAD/chronic ischemic cardiomyopathy/CABG Chronic combined systolic and diastolic heart failure -Currently stable.  Volume managed by dialysis.  Hold aspirin.  Outpatient follow-up with cardiology  Paroxysmal A. Fib -Currently not on anticoagulation.  with history of Maze procedure.  Currently rate controlled  History of prostate cancer -Treated with seed implants about 10 years ago  Generalized conditioning -PT eval.  Palliative care consultation for goals of care discussion she wants to be full code but overall prognosis is guarded to poor  DVT prophylaxis: SCDs Code Status: Full Family Communication: Spoke to son/daughter-in-law on phone Disposition Plan: Home in 1 to 2 days once clinically improved and cleared by GI Consults called: GI/general surgery/nephrology Admission status: Inpatient/telemetry  Severity of Illness: The appropriate patient status for this patient is INPATIENT.  Inpatient status is judged to be reasonable and necessary in order to provide the required intensity of service to ensure the patient's safety. The patient's presenting symptoms, physical exam findings, and initial radiographic and laboratory data in the context of their chronic comorbidities is felt to place them at high risk for further clinical deterioration. Furthermore, it is not anticipated that the patient will be medically stable for discharge from the hospital within 2 midnights of admission. The  following factors support the patient status of inpatient.   " The patient's presenting symptoms include rectal bleeding. " The worrisome physical exam findings include lower quadrant tenderness/left inguinal hernia. " The initial radiographic and laboratory data are worrisome because of hemoglobin of 9; large left inguinal hernia. " The chronic co-morbidities include end-stage renal disease on hemodialysis/chronic combined heart failure/history of CABG/recent diagnosis of pemphigus vulgaris.   * I certify that at the point of admission it is my clinical judgment that the patient will require inpatient hospital care spanning beyond 2 midnights from the point of admission due to high intensity of service, high risk for further deterioration and high frequency of surveillance required.Aline August MD [facility]  10/13/2019, 1:55 PM

## 2019-10-13 NOTE — ED Notes (Signed)
IV team will see pt soon.

## 2019-10-13 NOTE — ED Provider Notes (Signed)
[facility] EMERGENCY DEPARTMENT Provider Note   CSN: [id number] Arrival date & time: 10/13/19  1884     History Chief Complaint  Patient presents with  . Rectal Bleeding    Jesse Murphy is a 86 y.o. male.  HPI Patient presents with rectal pain and bleeding.  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  Currently on steroids.  He is a dialysis patient and was due for dialysis morning.  Has lower abdominal/rectal pain.  Also blood in the stool.  States it feels if there is a twisting in his rectal area.  States the pain comes and goes.  States he will cramp up severely and then relieved some.  Has been more constant today.  Pain has been there for a day or 2.  States he is on aspirin.  Reviewing records blood pressure appears to run low and patient is on midodrine.    Past Medical History:  Diagnosis Date  . Anemia   . CAD (coronary artery disease)    a. s/p PCI in 1992 b. Coronary CT in 01/2018 showing extensive coronary calcification; FFR indeterminate --> medical management pursued at that time as patient not interested in repeat cath. c. s/p CABGx3 in 02/2019.  . Cancer [facility])    prostate  . Cardiogenic shock ([place])   . CHB (complete heart block) (HCC)    a. due to vagal event in 2021, requiring temp pacer.  . Chronic combined systolic and diastolic CHF (congestive heart failure) ([place])   . Debilitated   . Dyspnea   . End stage renal disease ([place])   . Esophagitis   . H/O tracheostomy    a. during admit in 2021 for respiratory failure.  . Hematuria   . Hypertension   . Hypoalbuminemia   . Ischemic cardiomyopathy   . Myocardial infarction ([place])   . PAF (paroxysmal atrial fibrillation) ([place])   . Pleural effusion, left    a. s/p pleurX catheter  . Pseudomonas pneumonia ([place])   . Thrombocytopenia [facility])     Patient Active Problem List   Diagnosis Date Noted  . 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 History of atrial fibrillation- treated surgically, no longer has it per the patient 10/01/2019  . Personal history of prostate cancer 10/01/2019  . Stevens-Johnson syndrome ([place]) 10/01/2019  . Abnormality of gait 09/24/2019  . History of pleural effusion 08/22/2019  . Pleural effusion on left 07/11/2019  . Hematuria 07/09/2019  . Postop check 06/27/2019  . Adult failure to thrive   . History of non-Hodgkin's lymphoma   . Chest tube in place   . Failure to thrive (child)   . Prediabetes   . Hypoalbuminemia due to protein-calorie malnutrition ([place])   . Failure to thrive in adult   . Allergic contact dermatitis   . ESRD on dialysis ([place])   . Dysphagia   . Thrombocytopenia ([place])   . Acute on chronic anemia   . Supplemental oxygen dependent   . Acute systolic congestive heart failure ([place] [address])   . Pressure ulcer 05/23/2019  . Debility 05/22/2019  . Palliative care encounter   . Pressure injury of skin 03/25/2019  . Acute respiratory failure with hypoxia ([place])   . S/P CABG x 3 03/16/2019  . Non-ST elevation (NSTEMI) myocardial infarction ([place])   . Acute on chronic combined systolic and diastolic CHF (congestive heart failure) ([place])   . Coronary artery disease due to lipid rich  plaque   . Atrial fibrillation with RVR ([place]) 03/14/2019  . Atrial fibrillation with rapid ventricular response ([place]) 03/13/2019  . Chest pain 09/12/2018  . Chronic diastolic heart failure ([place]) 05/29/2018  . Hypertension 05/29/2018  . Non Hodgkin's lymphoma ([place]) 09/30/2017  . Heme positive stool 08/11/2017  . Chronic anticoagulation 07/05/2016  . CAD S/P percutaneous coronary angioplasty 07/05/2016  . Type 2 diabetes mellitus without complications ([place]) 80/32/1224  . PAF (paroxysmal atrial fibrillation) ([place]) 06/14/2016  . Chest pain in adult 06/14/2016  . Dyspnea on exertion 06/14/2016  . Allergic rhinitis 06/14/2016  . GERD (gastroesophageal reflux disease) 06/14/2016  . BPH (benign  prostatic hyperplasia) 06/14/2016    Past Surgical History:  Procedure Laterality Date  . BASCILIC VEIN TRANSPOSITION Right 09/10/2019   Procedure: RIGHT ARM 1ST STAGE BASCILIC VEIN TRANSPOSITION;  Surgeon: Angelia Mould, MD;  Location: [place];  Service: Vascular;  Laterality: Right;  . CHEST TUBE INSERTION Left 04/23/2019   Procedure: INSERTION PLEURAL DRAINAGE CATHETER TO DRAIN LEFT PLEURAL EFFUSION;  Surgeon: Ivin Poot, MD;  Location: [place];  Service: Thoracic;  Laterality: Left;  . CLIPPING OF ATRIAL APPENDAGE N/A 03/16/2019   Procedure: CLIPPING OF LEFT  ATRIAL APPENDAGE - USING ATRICLIP SIZE 40;  Surgeon: Ivin Poot, MD;  Location: [place];  Service: Open Heart Surgery;  Laterality: N/A;  . COLONOSCOPY N/A 08/25/2017   Procedure: COLONOSCOPY;  Surgeon: Rogene Houston, MD;  Location[facility];  Service: Endoscopy;  Laterality: N/A;  . CORONARY ARTERY BYPASS GRAFT N/A 03/16/2019   Procedure: CORONARY ARTERY BYPASS GRAFTING (CABG), ON PUMP, TIMES THREE, USING LEFT INTERNAL MAMMARY ARTERY, RIGHT GREATER SAPHENOUS VEIN HARVESTED ENDOSCOPICALLY;  Surgeon: Ivin Poot, MD;  Location: [place];  Service: Open Heart Surgery;  Laterality: N/A;  swan only  . CYSTOSCOPY WITH FULGERATION N/A 04/30/2019   Procedure: CYSTOSCOPY WITH FULGERATION;  Surgeon: Irine Seal, MD;  Location: [place];  Service: Urology;  Laterality: N/A;  . HERNIA REPAIR    . IR FLUORO GUIDE CV LINE LEFT  04/23/2019  . IR FLUORO GUIDE CV LINE RIGHT  05/24/2019  . IR US GUIDE VASC ACCESS LEFT  04/23/2019  . MAZE N/A 03/16/2019   Procedure: MAZE;  Surgeon: Ivin Poot, MD;  Location: [place];  Service: Open Heart Surgery;  Laterality: N/A;  . PLACEMENT OF IMPELLA LEFT VENTRICULAR ASSIST DEVICE Right 03/27/2019   Procedure: Placement Of Impella Left Ventricular Assist Device using ABIOMED Impella 5.5 with SmartAssist Device.;  Surgeon: Wonda Olds, MD;  Location: [facility];  Service: Thoracic;  Laterality: Right;    . PROSTATE SURGERY    . REMOVAL OF IMPELLA LEFT VENTRICULAR ASSIST DEVICE N/A 04/10/2019   Procedure: REMOVAL OF IMPELLA LEFT VENTRICULAR ASSIST DEVICE WITH INSERTION OF RIGHT FEMORAL ARTERIAL LINE;  Surgeon: Ivin Poot, MD;  Location: [place];  Service: Open Heart Surgery;  Laterality: N/A;  . REMOVAL OF PLEURAL DRAINAGE CATHETER Left 07/02/2019   Procedure: REMOVAL OF PLEURAL DRAINAGE CATHETER;  Surgeon: Ivin Poot, MD;  Location: [place];  Service: Thoracic;  Laterality: Left;  . RIGHT/LEFT HEART CATH AND CORONARY ANGIOGRAPHY N/A 03/15/2019   Procedure: RIGHT/LEFT HEART CATH AND CORONARY ANGIOGRAPHY;  Surgeon: Burnell Blanks, MD;  Location: [place] CV LAB;  Service: Cardiovascular;  Laterality: N/A;  . TEE WITHOUT CARDIOVERSION N/A 03/16/2019   Procedure: TRANSESOPHAGEAL ECHOCARDIOGRAM (TEE);  Surgeon: Prescott Gum, Collier Salina, MD;  Location: [place];  Service: Open Heart Surgery;  Laterality: N/A;  .  TRACHEOSTOMY TUBE PLACEMENT N/A 03/27/2019   Procedure: TRACHEOSTOMY placed using [place]iley 8DCT Cuffed.;  Surgeon: Wonda Olds, MD;  Location[facility];  Service: Thoracic;  Laterality: N/A;  . TRANSURETHRAL RESECTION OF BLADDER NECK N/A 05/13/2019   Procedure: CYSTOSCOPY CLOT EVACUATION FULGRATION CYSTOGRAM AND INSTILLATION OF FORMALIN;  Surgeon: Lucas Mallow, MD;  Location: [place];  Service: Urology;  Laterality: N/A;       Family History  Problem Relation Age of Onset  . CAD Mother 3  . CAD Father 59  . CAD Sister   . CAD Brother     Social History   Tobacco Use  . Smoking status: Former Smoker    Packs/day: 0.25    Years: 50.00    Pack years: 12.50    Types: Cigarettes    Quit date: 02/15/1990    Years since quitting: 29.6  . Smokeless tobacco: Never Used  Vaping Use  . Vaping Use: Never used  Substance Use Topics  . Alcohol use: No  . Drug use: No    Home Medications Prior to Admission medications   Medication Sig Start Date End Date Taking? Authorizing  Provider  acetaminophen (TYLENOL) 500 MG tablet Take 500 mg by mouth every 6 (six) hours as needed for moderate pain.   Yes [provider]  aspirin 81 MG chewable tablet Chew 1 tablet by mouth in the morning. 05/21/19  Yes [provider]  Calcium Carbonate-Vitamin D 600-400 MG-UNIT tablet Take 1 tablet by mouth daily. 10/08/19 12/07/19 Yes [provider]  carboxymethylcellulose (REFRESH PLUS) 0.5 % SOLN Place 1 drop into both eyes 4 (four) times daily. 10/08/19  Yes [provider]  midodrine (PROAMATINE) 10 MG tablet Take 1 tablet by mouth 2 (two) times daily. 10/08/19 10/07/20 Yes [provider]  midodrine (PROAMATINE) 5 MG tablet Take 1 tablet by mouth 3 (three) times a week. Monday, Wednesday, Friday 10/10/19 10/09/20 Yes [provider]  mupirocin ointment (BACTROBAN) 2 % Apply 1 application topically in the morning, at noon, and at bedtime. For 7 days. Apply to "burn wounds" all over his body [place]. Please DO NOT apply to face. 10/08/19 10/15/19 Yes [provider]  nystatin ointment (MYCOSTATIN) Apply 1 application topically daily as needed for itching. Apply to Penis as needed if itching occurs. 06/22/19  Yes [provider]  predniSONE (DELTASONE) 10 MG tablet Take 7 tablets by mouth daily. 10/09/19  Yes [provider]  senna (SENOKOT) 8.6 MG tablet Take 1 tablet by mouth as needed. 10/08/19 10/07/20 Yes [provider]  sulfamethoxazole-trimethoprim (BACTRIM) 400-80 MG tablet Take 1 tablet by mouth 3 (three) times a week. Monday, Wednesday, Friday 10/08/19 12/08/19 Yes [provider]  triamcinolone cream (KENALOG) 0.1 % Apply 1 application topically 2 (two) times daily. Apply to skin that is intact. DO NOT apply to "burn wounds". 10/08/19 10/07/20 Yes [provider]    Allergies    Atorvastatin, Midodrine hcl, Amiodarone, Heparin, and Reglan [metoclopramide]  Review of Systems     Review of Systems  Constitutional: Negative for appetite change.  HENT: Negative for congestion.   Respiratory: Negative for shortness of breath.   Cardiovascular: Negative for chest pain.  Gastrointestinal: Positive for abdominal pain, blood in stool and rectal pain.  Musculoskeletal: Negative for back pain.  Skin: Positive for rash.  Neurological: Negative for weakness.    Physical Exam Updated Vital Signs BP (!) 127/54   Pulse 86   Temp 97.7 F (  36.5 C) (Oral)   Resp 18   SpO2 97%   Physical Exam Vitals and nursing note reviewed.  HENT:     Head: Normocephalic.  Eyes:     Extraocular Movements: Extraocular movements intact.  Cardiovascular:     Rate and Rhythm: Normal rate. Rhythm irregular.  Pulmonary:     Effort: Pulmonary effort is normal.  Abdominal:     Comments: Tenderness to lower abdominal area.  May have some mild fullness.  Rectal exam showed oozing blood with some external pain.  No clear mass.  Skin:    General: Skin is warm.     Capillary Refill: Capillary refill takes less than 2 seconds.     Comments: Diffuse rash.  Some resolution of desquamation.  Neurological:     Mental Status: He is alert and oriented to person, place, and time.   Left inguinal hernia.  Reducible but immediately recurs.  ED Results / Procedures / Treatments   Labs (all labs ordered are listed, but only abnormal results are displayed) Labs Reviewed  COMPREHENSIVE METABOLIC PANEL - Abnormal; Notable for the following components:      Result Value   Sodium 134 (*)    Chloride 96 (*)    Glucose, Bld 150 (*)    BUN 68 (*)    Creatinine, Ser 4.05 (*)    Calcium 8.0 (*)    Total Protein 4.7 (*)    Albumin 2.2 (*)    ALT 48 (*)    GFR calc non Af Amer 12 (*)    GFR calc Af Amer 14 (*)    All other components within normal limits  CBC WITH DIFFERENTIAL/PLATELET - Abnormal; Notable for the following components:   WBC 15.7 (*)    RBC 2.79 (*)    Hemoglobin 9.0 (*)    HCT  29.1 (*)    MCV 104.3 (*)    RDW 17.6 (*)    Neutro Abs 14.2 (*)    Lymphs Abs 0.4 (*)    Abs Immature Granulocytes 0.35 (*)    All other components within normal limits  SARS CORONAVIRUS 2 BY RT PCR (HOSPITAL ORDER, [place] [facility])  PROTIME-INR  TYPE AND SCREEN  ABO/RH    EKG None  Radiology CT ABDOMEN PELVIS WO CONTRAST  Result Date: 10/13/2019 CLINICAL DATA:  GI bleed. Jelly like blood in underwear this morning. Diffuse pain. EXAM: CT ABDOMEN AND PELVIS WITHOUT CONTRAST TECHNIQUE: Multidetector CT imaging of the abdomen and pelvis was performed following the standard protocol without IV contrast. COMPARISON:  04/13/2019 FINDINGS: Lower chest: Bilateral pleural effusions are noted, left greater than right. Hepatobiliary: No focal liver abnormality is seen. No gallstones, gallbladder wall thickening, or biliary dilatation. Pancreas: Unremarkable. No pancreatic ductal dilatation or surrounding inflammatory changes. Spleen: Normal in size without focal abnormality. Adrenals/Urinary Tract: Normal appearance of the adrenal glands. Small bilateral kidney stones are noted. The largest is in the upper pole of left kidney measuring 4 mm. No hydronephrosis identified bilaterally. Small focus of gas is noted within the urinary bladder. No focal bladder abnormality noted. Stomach/Bowel: The stomach appears normal. The appendix is visualized and appears normal. No bowel wall thickening, inflammation, or distension. There is a large left inguinal hernia containing a nonobstructed loop of sigmoid colon. Moderate hyperdense stool is identified within the colon and rectum. Vascular/Lymphatic: Aortic atherosclerosis. No aneurysm. No abdominopelvic adenopathy. Reproductive: Seed implants are noted within the prostate gland. Large right hydrocele noted, image 85/2. Other: Trace volume  of free fluid noted within the abdomen and pelvis. No focal fluid collections identified. Musculoskeletal:  Multilevel degenerative disc disease is noted within the lumbar spine. No acute or suspicious osseous findings. IMPRESSION: 1. No acute findings identified within the abdomen or pelvis. 2. Large left inguinal hernia containing a nonobstructed loop of sigmoid colon. 3. Large right hydrocele. 4. Bilateral pleural effusions, left greater than right. 5. Small focus of gas noted within the urinary bladder. Correlate for any clinical signs or symptoms of cystitis. 6. Aortic atherosclerosis. Aortic Atherosclerosis (ICD10-I70.0). Electronically Signed   By: Kerby Moors M.D.   On: 10/13/2019 12:04   DG Chest Portable 1 View  Result Date: 10/13/2019 CLINICAL DATA:  Weakness.  Rectal bleeding. EXAM: PORTABLE CHEST 1 VIEW COMPARISON:  08/22/2019 FINDINGS: Previous median sternotomy with CABG procedure and left atrial clip placement. There is a left chest wall dialysis catheter with tips at the cavoatrial junction. Normal heart size. Persistent small left pleural effusion identified with pulmonary vascular congestion. No airspace opacities or frank pulmonary edema. Visualized osseous structures are unremarkable. IMPRESSION: Pulmonary vascular congestion and small left pleural effusion. Electronically Signed   By: Kerby Moors M.D.   On: 10/13/2019 11:38    Procedures Procedures (including critical care time)  Medications Ordered in ED Medications  0.9 %  sodium chloride infusion (has no administration in time range)  sodium chloride flush (NS) 0.9 % injection 10-40 mL (has no administration in time range)  sodium chloride flush (NS) 0.9 % injection 10-40 mL (has no administration in time range)    ED Course  I have reviewed the triage vital signs and the nursing notes.  Pertinent labs & imaging results that were available during my care of the patient were reviewed by me and considered in my medical decision making (see chart for details).    MDM Rules/Calculators/A&P                           Patient presents with rectal pain and bleeding.  On prednisone for recent pemphigus vulgaris.  Vitals have been overall reassuring here.  Does have mild anemia.  Does have a left inguinal hernia that does reduce but recurs.  Rectal exam showed some frank red blood.  CT scan done noncontrast due to IV access and renal disease.  Showed the hernia but no colitis.  Will admit to hospitalist.  CRITICAL CARE Performed by: Davonna Belling Total critical care time: 30 minutes Critical care time was exclusive of separately billable procedures and treating other patients. Critical care was necessary to treat or prevent imminent or life-threatening deterioration. Critical care was time spent personally by me on the following activities: development of treatment plan with patient and/or surrogate as well as nursing, discussions with consultants, evaluation of patient's response to treatment, examination of patient, obtaining history from patient or surrogate, ordering and performing treatments and interventions, ordering and review of laboratory studies, ordering and review of radiographic studies, pulse oximetry and re-evaluation of patient's condition.  Final Clinical Impression(s) / ED Diagnoses Final diagnoses:  Rectal bleeding  Rectal pain  End stage renal disease on dialysis ([place])  Unilateral inguinal hernia without obstruction or gangrene, recurrence not specified    Rx / DC Orders ED Discharge Orders    None       Davonna Belling, MD 10/13/19 1259

## 2019-10-13 NOTE — ED Notes (Signed)
Unsuccessful IV stick x3.  

## 2019-10-13 NOTE — ED Notes (Signed)
Dr Starla Link in to see pt.

## 2019-10-13 NOTE — ED Notes (Signed)
Pt placed on hospital bed

## 2019-10-13 NOTE — ED Notes (Signed)
Pt has multiple skin tears, bruises, scaly skin to entire body.  Pt reports having some ttype of skin condition.

## 2019-10-13 NOTE — ED Triage Notes (Signed)
Pt started having rectal bleeding last night with pain. Describes the pain as dull with intermittent sharp pains. Hx of hemorrhoids. This morning pt had jelly like blood in his underwear. Is being treated for an autoimmune disorder at [facility]. Dialysis patient. Given 10 mg of Morphine IM by EMS. A fib controlled. VSS. CBG 131.

## 2019-10-14 DIAGNOSIS — K625 Hemorrhage of anus and rectum: Secondary | ICD-10-CM

## 2019-10-14 DIAGNOSIS — L100 Pemphigus vulgaris: Secondary | ICD-10-CM

## 2019-10-14 DIAGNOSIS — K6289 Other specified diseases of anus and rectum: Secondary | ICD-10-CM

## 2019-10-14 DIAGNOSIS — I5042 Chronic combined systolic (congestive) and diastolic (congestive) heart failure: Secondary | ICD-10-CM

## 2019-10-14 LAB — HEMOGLOBIN AND HEMATOCRIT, BLOOD
HCT: 24.5 % — ABNORMAL LOW (ref 39.0–52.0)
Hemoglobin: 7.6 g/dL — ABNORMAL LOW (ref 13.0–17.0)

## 2019-10-14 LAB — CBC
HCT: 26.4 % — ABNORMAL LOW (ref 39.0–52.0)
Hemoglobin: 8.1 g/dL — ABNORMAL LOW (ref 13.0–17.0)
MCH: 32.1 pg (ref 26.0–34.0)
MCHC: 30.7 g/dL (ref 30.0–36.0)
MCV: 104.8 fL — ABNORMAL HIGH (ref 80.0–100.0)
Platelets: 214 10*3/uL (ref 150–400)
RBC: 2.52 MIL/uL — ABNORMAL LOW (ref 4.22–5.81)
RDW: 17.6 % — ABNORMAL HIGH (ref 11.5–15.5)
WBC: 14.5 10*3/uL — ABNORMAL HIGH (ref 4.0–10.5)
nRBC: 0 % (ref 0.0–0.2)

## 2019-10-14 LAB — COMPREHENSIVE METABOLIC PANEL
ALT: 39 U/L (ref 0–44)
AST: 20 U/L (ref 15–41)
Albumin: 2.2 g/dL — ABNORMAL LOW (ref 3.5–5.0)
Alkaline Phosphatase: 56 U/L (ref 38–126)
Anion gap: 15 (ref 5–15)
BUN: 41 mg/dL — ABNORMAL HIGH (ref 8–23)
CO2: 25 mmol/L (ref 22–32)
Calcium: 8 mg/dL — ABNORMAL LOW (ref 8.9–10.3)
Chloride: 95 mmol/L — ABNORMAL LOW (ref 98–111)
Creatinine, Ser: 3.04 mg/dL — ABNORMAL HIGH (ref 0.61–1.24)
GFR calc Af Amer: 20 mL/min — ABNORMAL LOW (ref >60)
GFR calc non Af Amer: 17 mL/min — ABNORMAL LOW (ref >60)
Glucose, Bld: 84 mg/dL (ref 70–99)
Potassium: 4.5 mmol/L (ref 3.5–5.1)
Sodium: 135 mmol/L (ref 135–145)
Total Bilirubin: 0.8 mg/dL (ref 0.3–1.2)
Total Protein: 4.6 g/dL — ABNORMAL LOW (ref 6.5–8.1)

## 2019-10-14 LAB — PROTIME-INR
INR: 1.2 (ref 0.8–1.2)
Prothrombin Time: 14.4 seconds (ref 11.4–15.2)

## 2019-10-14 MED ORDER — POLYETHYLENE GLYCOL 3350 17 G PO PACK
17.0000 g | PACK | Freq: Every day | ORAL | Status: DC
  Administered 2019-10-15: 17 g via ORAL
  Filled 2019-10-14: qty 1

## 2019-10-14 MED ORDER — DOCUSATE SODIUM 100 MG PO CAPS
100.0000 mg | ORAL_CAPSULE | Freq: Two times a day (BID) | ORAL | Status: DC
  Administered 2019-10-14 – 2019-10-17 (×4): 100 mg via ORAL
  Filled 2019-10-14 (×6): qty 1

## 2019-10-14 NOTE — Consult Note (Addendum)
[facility] Nephrology Consult Note: Reason for Consult: To manage dialysis and dialysis related needs Referring Physician: Dr Starla Link, [place]  HPI:  Jesse Murphy is an 86 y.o. male with A. fib, CAD status post CABG, hypertension, CHF, ESRD on HD TTS at [facility] admitted with rectal bleeding, seen as a consultation for the management of ESRD. Patient was recently admitted to the hospital for pemphigus vulgaris required transfer to [facility] and discharged with oral prednisone. Per GI bleed he was seen by gastroenterologist and plan for sigmoidoscopy tomorrow.  CT scan without acute finding and hemoglobin is downtrending. Per dialysis he gets TTS schedule.  He had dialysis last night in the hospital.  Tolerated well.  He has left L IJ for the access and right upper extremity AV fistula is maturing well.  He denies nausea, vomiting, chest pain, shortness of breath.  He feels good.  Denies further bleeding.  Dialyzes at [facility], TTS EDW 69 kg, 4hours . HD Bath 2K, 2.25 ca, Dialyzer , Heparin . Access LIJ TDC Mircera 225 mcg last on 8/26 Iron 50 mg once a week, last dose 8/26.  Past Medical History:  Diagnosis Date  . Anemia   . CAD (coronary artery disease)    a. s/p PCI in 1992 b. Coronary CT in 01/2018 showing extensive coronary calcification; FFR indeterminate --> medical management pursued at that time as patient not interested in repeat cath. c. s/p CABGx3 in 02/2019.  . Cancer [facility])    prostate  . Cardiogenic shock ([place])   . CHB (complete heart block) (HCC)    a. due to vagal event in 2021, requiring temp pacer.  . Chronic combined systolic and diastolic CHF (congestive heart failure) ([place])   . Debilitated   . Dyspnea   . End stage renal disease ([place])   . Esophagitis   . H/O tracheostomy    a. during admit in 2021 for respiratory failure.  . Hematuria   . Hypertension   . Hypoalbuminemia   . Ischemic cardiomyopathy   . Myocardial infarction ([place])    . PAF (paroxysmal atrial fibrillation) ([place])   . Pleural effusion, left    a. s/p pleurX catheter  . Pseudomonas pneumonia ([place])   . Thrombocytopenia ([place])     Past Surgical History:  Procedure Laterality Date  . BASCILIC VEIN TRANSPOSITION Right 09/10/2019   Procedure: RIGHT ARM 1ST STAGE BASCILIC VEIN TRANSPOSITION;  Surgeon: Angelia Mould, MD;  Location: [place];  Service: Vascular;  Laterality: Right;  . CHEST TUBE INSERTION Left 04/23/2019   Procedure: INSERTION PLEURAL DRAINAGE CATHETER TO DRAIN LEFT PLEURAL EFFUSION;  Surgeon: Ivin Poot, MD;  Location: [place];  Service: Thoracic;  Laterality: Left;  . CLIPPING OF ATRIAL APPENDAGE N/A 03/16/2019   Procedure: CLIPPING OF LEFT  ATRIAL APPENDAGE - USING ATRICLIP SIZE 40;  Surgeon: Ivin Poot, MD;  Location: [place];  Service: Open Heart Surgery;  Laterality: N/A;  . COLONOSCOPY N/A 08/25/2017   Procedure: COLONOSCOPY;  Surgeon: Rogene Houston, MD;  [facility];  Service: Endoscopy;  Laterality: N/A;  . CORONARY ARTERY BYPASS GRAFT N/A 03/16/2019   Procedure: CORONARY ARTERY BYPASS GRAFTING (CABG), ON PUMP, TIMES THREE, USING LEFT INTERNAL MAMMARY ARTERY, RIGHT GREATER SAPHENOUS VEIN HARVESTED ENDOSCOPICALLY;  Surgeon: Ivin Poot, MD;  Location: [place];  Service: Open Heart Surgery;  Laterality: N/A;  swan only  . CYSTOSCOPY WITH FULGERATION N/A 04/30/2019   Procedure: CYSTOSCOPY WITH FULGERATION;  Surgeon: Jeffie Pollock,  Jenny Reichmann, MD;  Location: [place];  Service: Urology;  Laterality: N/A;  . HERNIA REPAIR    . IR FLUORO GUIDE CV LINE LEFT  04/23/2019  . IR FLUORO GUIDE CV LINE RIGHT  05/24/2019  . IR US GUIDE VASC ACCESS LEFT  04/23/2019  . MAZE N/A 03/16/2019   Procedure: MAZE;  Surgeon: Ivin Poot, MD;  Location: [place];  Service: Open Heart Surgery;  Laterality: N/A;  . PLACEMENT OF IMPELLA LEFT VENTRICULAR ASSIST DEVICE Right 03/27/2019   Procedure: Placement Of Impella Left Ventricular Assist Device using ABIOMED  Impella 5.5 with SmartAssist Device.;  Surgeon: Wonda Olds, MD;  Location: [facility];  Service: Thoracic;  Laterality: Right;  . PROSTATE SURGERY    . REMOVAL OF IMPELLA LEFT VENTRICULAR ASSIST DEVICE N/A 04/10/2019   Procedure: REMOVAL OF IMPELLA LEFT VENTRICULAR ASSIST DEVICE WITH INSERTION OF RIGHT FEMORAL ARTERIAL LINE;  Surgeon: Ivin Poot, MD;  Location: [place];  Service: Open Heart Surgery;  Laterality: N/A;  . REMOVAL OF PLEURAL DRAINAGE CATHETER Left 07/02/2019   Procedure: REMOVAL OF PLEURAL DRAINAGE CATHETER;  Surgeon: Ivin Poot, MD;  Location: [place];  Service: Thoracic;  Laterality: Left;  . RIGHT/LEFT HEART CATH AND CORONARY ANGIOGRAPHY N/A 03/15/2019   Procedure: RIGHT/LEFT HEART CATH AND CORONARY ANGIOGRAPHY;  Surgeon: Burnell Blanks, MD;  Location: [place] CV LAB;  Service: Cardiovascular;  Laterality: N/A;  . TEE WITHOUT CARDIOVERSION N/A 03/16/2019   Procedure: TRANSESOPHAGEAL ECHOCARDIOGRAM (TEE);  Surgeon: Prescott Gum, Collier Salina, MD;  Location: [place];  Service: Open Heart Surgery;  Laterality: N/A;  . TRACHEOSTOMY TUBE PLACEMENT N/A 03/27/2019   Procedure: TRACHEOSTOMY placed using Shiley 8DCT Cuffed.;  Surgeon: Wonda Olds, MD;  [facility];  Service: Thoracic;  Laterality: N/A;  . TRANSURETHRAL RESECTION OF BLADDER NECK N/A 05/13/2019   Procedure: CYSTOSCOPY CLOT EVACUATION FULGRATION CYSTOGRAM AND INSTILLATION OF FORMALIN;  Surgeon: Lucas Mallow, MD;  Location: [place];  Service: Urology;  Laterality: N/A;    Family History  Problem Relation Age of Onset  . CAD Mother 56  . CAD Father 1  . CAD Sister   . CAD Brother     Social History:  reports that he quit smoking about 29 years ago. His smoking use included cigarettes. He has a 12.50 pack-year smoking history. He has never used smokeless tobacco. He reports that he does not drink alcohol and does not use drugs.  Allergies:  Allergies  Allergen Reactions  . Atorvastatin Anaphylaxis  and Rash    Leg pain  . Midodrine Hcl Rash    Severe sloughing body skin rash 3 days after starting midodrine in Aug 2021  . Amiodarone     Rash (see 05/2019 hospital notes), but was on other medications at the time so unclear which agent   . Heparin Other (See Comments)    Thrombocytopenia/ HIT  . Reglan [Metoclopramide]     Rash (see 05/2019 hospital notes), but was on other medications at the time so unclear which agent    Medications: I have reviewed the patient's current medications.   Results for orders placed or performed during the hospital encounter of 10/13/19 (from the past 48 hour(s))  Comprehensive metabolic panel     Status: Abnormal   Collection Time: 10/13/19 10:15 AM  Result Value Ref Range   Sodium 134 (L) 135 - 145 mmol/L   Potassium 4.4 3.5 - 5.1 mmol/L   Chloride 96 (L) 98 - 111 mmol/L  CO2 26 22 - 32 mmol/L   Glucose, Bld 150 (H) 70 - 99 mg/dL    Comment: Glucose reference range applies only to samples taken after fasting for at least 8 hours.   BUN 68 (H) 8 - 23 mg/dL   Creatinine, Ser 4.05 (H) 0.61 - 1.24 mg/dL   Calcium 8.0 (L) 8.9 - 10.3 mg/dL   Total Protein 4.7 (L) 6.5 - 8.1 g/dL   Albumin 2.2 (L) 3.5 - 5.0 g/dL   AST 21 15 - 41 U/L   ALT 48 (H) 0 - 44 U/L   Alkaline Phosphatase 58 38 - 126 U/L   Total Bilirubin 0.6 0.3 - 1.2 mg/dL   GFR calc non Af Amer 12 (L) >60 mL/min   GFR calc Af Amer 14 (L) >60 mL/min   Anion gap 12 5 - 15    Comment: Performed at [facility], [address] [place], [place] [address]  CBC WITH DIFFERENTIAL     Status: Abnormal   Collection Time: 10/13/19 10:15 AM  Result Value Ref Range   WBC 15.7 (H) 4.0 - 10.5 K/uL   RBC 2.79 (L) 4.22 - 5.81 MIL/uL   Hemoglobin 9.0 (L) 13.0 - 17.0 g/dL   HCT 29.1 (L) 39 - 52 %   MCV 104.3 (H) 80.0 - 100.0 fL   MCH 32.3 26.0 - 34.0 pg   MCHC 30.9 30.0 - 36.0 g/dL   RDW 17.6 (H) 11.5 - 15.5 %   Platelets 258 150 - 400 K/uL   nRBC 0.0 0.0 - 0.2 %   Neutrophils Relative % 90 %    Neutro Abs 14.2 (H) 1.7 - 7.7 K/uL   Lymphocytes Relative 3 %   Lymphs Abs 0.4 (L) 0.7 - 4.0 K/uL   Monocytes Relative 5 %   Monocytes Absolute 0.7 0 - 1 K/uL   Eosinophils Relative 0 %   Eosinophils Absolute 0.0 0 - 0 K/uL   Basophils Relative 0 %   Basophils Absolute 0.0 0 - 0 K/uL   Immature Granulocytes 2 %   Abs Immature Granulocytes 0.35 (H) 0.00 - 0.07 K/uL    Comment: Performed at [facility], [address], [place], [place] [address]  Protime-INR     Status: None   Collection Time: 10/13/19 10:15 AM  Result Value Ref Range   Prothrombin Time 13.7 11.4 - 15.2 seconds   INR 1.1 0.8 - 1.2    Comment: (NOTE) INR goal varies based on device and disease states. Performed at [facility], [address], [place], [place] [address]   Type and screen [facility]     Status: None   Collection Time: 10/13/19 10:15 AM  Result Value Ref Range   ABO/RH(D) B POS    Antibody Screen NEG    Sample Expiration      10/16/2019,2359 Performed at [facility], [address], [place], [place] [address]   SARS Coronavirus 2 by RT PCR (hospital order, performed in [facility] hospital lab) Nasopharyngeal Nasopharyngeal Swab     Status: None   Collection Time: 10/13/19 10:19 AM   Specimen: Nasopharyngeal Swab  Result Value Ref Range   SARS Coronavirus 2 NEGATIVE NEGATIVE    Comment: (NOTE) SARS-CoV-2 target nucleic acids are NOT DETECTED.  The SARS-CoV-2 RNA is generally detectable in upper and lower respiratory specimens during the acute phase of infection. The lowest concentration of SARS-CoV-2 viral copies this assay can detect is 250 copies / mL. A negative result does  not preclude SARS-CoV-2 infection and should not be used as the sole basis for treatment or other patient management decisions.  A negative result may occur with improper specimen collection / handling, submission of specimen other than nasopharyngeal swab, presence of viral mutation(s) within the areas targeted  by this assay, and inadequate number of viral copies (<250 copies / mL). A negative result must be combined with clinical observations, patient history, and epidemiological information.  Fact Sheet for Patients:   StrictlyIdeas.no  Fact Sheet for Healthcare Providers: BankingDealers.co.za  This test is not yet approved or  cleared by the [place] FDA and has been authorized for detection and/or diagnosis of SARS-CoV-2 by FDA under an Emergency Use Authorization (EUA).  This EUA will remain in effect (meaning this test can be used) for the duration of the COVID-19 declaration under Section 564(b)(1) of the Act, 21 U.S.C. section 360bbb-3(b)(1), unless the authorization is terminated or revoked sooner.  Performed at [facility], [address], [place], [place] [address]   ABO/Rh     Status: None   Collection Time: 10/13/19 11:06 AM  Result Value Ref Range   ABO/RH(D)      B POS Performed at [facility], [address], [place], [place] [address]   Hemoglobin and hematocrit, blood     Status: Abnormal   Collection Time: 10/13/19  8:04 PM  Result Value Ref Range   Hemoglobin 8.6 (L) 13.0 - 17.0 g/dL   HCT 28.0 (L) 39 - 52 %    Comment: Performed at [facility], [address], [place], [place] [address]  Comprehensive metabolic panel     Status: Abnormal   Collection Time: 10/14/19  7:46 AM  Result Value Ref Range   Sodium 135 135 - 145 mmol/L   Potassium 4.5 3.5 - 5.1 mmol/L   Chloride 95 (L) 98 - 111 mmol/L   CO2 25 22 - 32 mmol/L   Glucose, Bld 84 70 - 99 mg/dL    Comment: Glucose reference range applies only to samples taken after fasting for at least 8 hours.   BUN 41 (H) 8 - 23 mg/dL   Creatinine, Ser 3.04 (H) 0.61 - 1.24 mg/dL   Calcium 8.0 (L) 8.9 - 10.3 mg/dL   Total Protein 4.6 (L) 6.5 - 8.1 g/dL   Albumin 2.2 (L) 3.5 - 5.0 g/dL   AST 20 15 - 41 U/L   ALT 39 0 - 44 U/L   Alkaline Phosphatase 56 38 - 126 U/L    Total Bilirubin 0.8 0.3 - 1.2 mg/dL   GFR calc non Af Amer 17 (L) >60 mL/min   GFR calc Af Amer 20 (L) >60 mL/min   Anion gap 15 5 - 15    Comment: Performed at [facility], [address], [place], [place] [address]  CBC     Status: Abnormal   Collection Time: 10/14/19  7:46 AM  Result Value Ref Range   WBC 14.5 (H) 4.0 - 10.5 K/uL   RBC 2.52 (L) 4.22 - 5.81 MIL/uL   Hemoglobin 8.1 (L) 13.0 - 17.0 g/dL   HCT 26.4 (L) 39 - 52 %   MCV 104.8 (H) 80.0 - 100.0 fL   MCH 32.1 26.0 - 34.0 pg   MCHC 30.7 30.0 - 36.0 g/dL   RDW 17.6 (H) 11.5 - 15.5 %   Platelets 214 150 - 400 K/uL   nRBC 0.0 0.0 - 0.2 %    Comment: Performed at [facility], [address], [place], [place] [address]  Protime-INR     Status: None   Collection Time: 10/14/19  7:46 AM  Result Value Ref Range   Prothrombin Time 14.4 11.4 - 15.2 seconds   INR 1.2 0.8 - 1.2    Comment: (NOTE) INR goal varies based on device and disease states. Performed at [facility], [address] [place], [place] [address]     CT ABDOMEN PELVIS WO CONTRAST  Result Date: 10/13/2019 CLINICAL DATA:  GI bleed. Jelly like blood in underwear this morning. Diffuse pain. EXAM: CT ABDOMEN AND PELVIS WITHOUT CONTRAST TECHNIQUE: Multidetector CT imaging of the abdomen and pelvis was performed following the standard protocol without IV contrast. COMPARISON:  04/13/2019 FINDINGS: Lower chest: Bilateral pleural effusions are noted, left greater than right. Hepatobiliary: No focal liver abnormality is seen. No gallstones, gallbladder wall thickening, or biliary dilatation. Pancreas: Unremarkable. No pancreatic ductal dilatation or surrounding inflammatory changes. Spleen: Normal in size without focal abnormality. Adrenals/Urinary Tract: Normal appearance of the adrenal glands. Small bilateral kidney stones are noted. The largest is in the upper pole of left kidney measuring 4 mm. No hydronephrosis identified bilaterally. Small focus of gas is noted within the  urinary bladder. No focal bladder abnormality noted. Stomach/Bowel: The stomach appears normal. The appendix is visualized and appears normal. No bowel wall thickening, inflammation, or distension. There is a large left inguinal hernia containing a nonobstructed loop of sigmoid colon. Moderate hyperdense stool is identified within the colon and rectum. Vascular/Lymphatic: Aortic atherosclerosis. No aneurysm. No abdominopelvic adenopathy. Reproductive: Seed implants are noted within the prostate gland. Large right hydrocele noted, image 85/2. Other: Trace volume of free fluid noted within the abdomen and pelvis. No focal fluid collections identified. Musculoskeletal: Multilevel degenerative disc disease is noted within the lumbar spine. No acute or suspicious osseous findings. IMPRESSION: 1. No acute findings identified within the abdomen or pelvis. 2. Large left inguinal hernia containing a nonobstructed loop of sigmoid colon. 3. Large right hydrocele. 4. Bilateral pleural effusions, left greater than right. 5. Small focus of gas noted within the urinary bladder. Correlate for any clinical signs or symptoms of cystitis. 6. Aortic atherosclerosis. Aortic Atherosclerosis (ICD10-I70.0). Electronically Signed   By: Kerby Moors M.D.   On: 10/13/2019 12:04   DG Chest Portable 1 View  Result Date: 10/13/2019 CLINICAL DATA:  Weakness.  Rectal bleeding. EXAM: PORTABLE CHEST 1 VIEW COMPARISON:  08/22/2019 FINDINGS: Previous median sternotomy with CABG procedure and left atrial clip placement. There is a left chest wall dialysis catheter with tips at the cavoatrial junction. Normal heart size. Persistent small left pleural effusion identified with pulmonary vascular congestion. No airspace opacities or frank pulmonary edema. Visualized osseous structures are unremarkable. IMPRESSION: Pulmonary vascular congestion and small left pleural effusion. Electronically Signed   By: Kerby Moors M.D.   On: 10/13/2019 11:38     ROS:  Blood pressure (!) 109/53, pulse (!) 105, temperature 99 F (37.2 C), resp. rate 20, height 5\' 8"  (1.727 m), weight 70.1 kg, SpO2 98 %. Gen: NAD, comfortable Respiratory: Clear bilateral, no wheezing or crackle Cardiovascular: Regular rate rhythm S1-S2 normal, no rubs GI: Abdomen soft, nontender, nondistended Extremities, no cyanosis or clubbing, LE pitting edema+ Skin: No rash or ulcer Neurology: Alert, awake, following commands, oriented Dialysis Access: Left IJ TDC, right upper extremity AV fistula has good thrill and bruit  Assessment/Plan:  #Anemia due to GI bleed and ESRD: Hemoglobin is downtrending.  Seen by GI and plan for sigmoidoscopy tomorrow.  Receives  weekly iron and Mircera at dialysis unit, last dose on 8/26.  Monitor hemoglobin.  # ESRD TTS at [facility]: Had dialysis yesterday tolerated well however with not much UF.  Plan for next HD on 8/31.  May be able to access the fistula.  # Hypertension/volume: On midodrine for intradialytic hypotension.  Volume management by dialysis.  # Metabolic Bone Disease: Check phosphorus level.  Corrected calcium level acceptable.  #Recent diagnosis of pemphigus vulgaris: On prednisone.  Per primary team.  #Paroxysmal A. Fib: Not on anticoagulation.  Monitor heart rate  #CHF: Volume up on exam especially in lower extremities.  Attempt ultrafiltration during HD.  #Physical deconditioning/debility: PT OT eval.  Discussed with the primary team.   Rosita Fire 10/14/2019, 12:38 PM

## 2019-10-14 NOTE — TOC Initial Note (Signed)
Transition of Care [facility]) - Initial/Assessment Note    Patient Details  Name: Jesse Murphy MRN: [id number] Date of Birth: 1928-02-19  Transition of Care [facility]) CM/SW Contact:    Akito Boomhower, Chauncey Reading, RN Phone Number: 10/14/2019, 9:28 AM  Clinical Narrative:  Admit with rectal bleed. 86 y.o.malewith medical history significant ofend-stage renal disease on hemodialysis, paroxysmal A. fib, CAD/MI/ischemic cardiomyopathy/history of CABG in January 2021, history of tracheostomy temporarily, chronic systolic and diastolic heart failure, complete heart block requiring temporary pacemaker, prostate cancer, recent diagnosis of pemphigus vulgaris. Recent admission on 10/01/19 at [facility] and was transferred to [facility].   Patient is from home with family. History of home health with [facility]. Dialysis schedule TTS.    PT and palliative evaluation pending.   TOC team to follow and address needs.       Expected Discharge Plan: [place] Barriers to Discharge: Continued Medical Work up    Expected Discharge Plan and Services Expected Discharge Plan: [place]   Discharge Planning Services: CM Consult   Living arrangements for the past 2 months: Single Family Home                           Prior Living Arrangements/Services Living arrangements for the past 2 months: Single Family Home Lives with:: Spouse            Activities of Daily Living Home Assistive Devices/Equipment: Dentures (specify type), Wheelchair, Environmental consultant (specify type) ADL Screening (condition at time of admission) Patient's cognitive ability adequate to safely complete daily activities?: Yes Is the patient deaf or have difficulty hearing?: No Does the patient have difficulty seeing, even when wearing glasses/contacts?: No Does the patient have difficulty concentrating, remembering, or making decisions?: No Patient able to express need for assistance with ADLs?: Yes Does the  patient have difficulty dressing or bathing?: Yes Independently performs ADLs?: Yes (appropriate for developmental age) Does the patient have difficulty walking or climbing stairs?: Yes Weakness of Legs: Both Weakness of Arms/Hands: Both     Admission diagnosis:  Rectal pain [K62.89] Rectal bleeding [K62.5] End stage renal disease on dialysis ([place]) [N18.6, Z99.2] Unilateral inguinal hernia without obstruction or gangrene, recurrence not specified [K40.90] Patient Active Problem List   Diagnosis Date Noted  . Rectal bleeding 10/13/2019  . Inguinal hernia of left side without obstruction or gangrene   . 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 History of atrial fibrillation- treated surgically, no longer has it per the patient 10/01/2019  . Personal history of prostate cancer 10/01/2019  . Stevens-Johnson syndrome ([place]) 10/01/2019  . Abnormality of gait 09/24/2019  . History of pleural effusion 08/22/2019  . Pleural effusion on left 07/11/2019  . Hematuria 07/09/2019  . Postop check 06/27/2019  . Adult failure to thrive   . History of non-Hodgkin's lymphoma   . Chest tube in place   . Failure to thrive (child)   . Prediabetes   . Hypoalbuminemia due to protein-calorie malnutrition ([place])   . Failure to thrive in adult   . Allergic contact dermatitis   . ESRD on dialysis ([place])   . Dysphagia   . Thrombocytopenia ([place])   . Acute on chronic anemia   . Supplemental oxygen dependent   . Acute systolic congestive heart failure ([place])   . Pressure ulcer 05/23/2019  . Debility 05/22/2019  . Palliative care encounter   . Pressure  injury of skin 03/25/2019  . Acute respiratory failure with hypoxia ([place])   . S/P CABG x 3 03/16/2019  . Non-ST elevation (NSTEMI) myocardial infarction ([place])   . Acute on chronic combined systolic and diastolic CHF (congestive heart failure) ([place])   . Coronary artery disease due to lipid rich  plaque   . Atrial fibrillation with RVR ([place]) 03/14/2019  . Atrial fibrillation with rapid ventricular response ([place]) 03/13/2019  . Chest pain 09/12/2018  . Chronic diastolic heart failure ([place]) 05/29/2018  . Hypertension 05/29/2018  . Non Hodgkin's lymphoma ([place]) 09/30/2017  . Heme positive stool 08/11/2017  . Chronic anticoagulation 07/05/2016  . CAD S/P percutaneous coronary angioplasty 07/05/2016  . Type 2 diabetes mellitus without complications ([place]) 56/31/4970  . PAF (paroxysmal atrial fibrillation) ([place]) 06/14/2016  . Chest pain in adult 06/14/2016  . Dyspnea on exertion 06/14/2016  . Allergic rhinitis 06/14/2016  . GERD (gastroesophageal reflux disease) 06/14/2016  . BPH (benign prostatic hyperplasia) 06/14/2016   PCP:  Leeanne Rio, MD Pharmacy:   [place], [address] [place] [address] Phone: [phone] Fax: [phone]     Readmission Risk Interventions No flowsheet data found.

## 2019-10-14 NOTE — Progress Notes (Signed)
Advised by [facility] vascular access to use dialysis catheter as central line this morning. Vascular Wellness team called and requested. Team to come place peripheral line. Patient remains without IV access at this time, will continue to monitor.

## 2019-10-14 NOTE — Consult Note (Signed)
Referring Provider: No ref. provider found Primary Care Physician:  Leeanne Rio, MD Primary Gastroenterologist:  Dr. Laural Golden  Reason for Consultation: Rectal bleeding.  Proctalgia.   HPI: 86 year old gentleman with multiple medical problems including recent bout of pemphigus vulgaris for which she was transferred to  [facility] and was placed on steroids. Now, presents with rectal pain and bleeding, multiple episodes.  Hemoglobin on 8/16  11.6; today 8.1; 8.1 on 06/09/2019.  White count mildly elevated at 14.5.  CT on admission demonstrated left inguinal hernia but no other acute findings such as colitis, neoplasm, etc.  He requires midodrine chronically for soft blood pressure.  He has remained hemodynamically stable.  Patient readily admits to chronic constipation he has been taking opioids.  2 days since he had a bowel movement.  He takes senna.  DRE in the ED demonstrated gross blood on examiner's finger.  Aspirin has been held.  Previously anticoagulated but none now.  His GI history is pertinent for rectal bleeding back in 2019 for which he underwent a colonoscopy by Dr. Laural Golden.  He was found to have rectal angioectasias with active bleeding (history of prostate cancer treated with radioactive seed implants). He was treated with hemospray only at that time.  He was also found to have innocent appearing left-sided diverticula at that time.  Patient denies upper abdominal pain, GERD, odynophagia, dysphagia, nausea or vomiting.  He has not had any melena.  Patient has been seen by Dr. Constance Haw regarding left inguinal hernia.  No surgical intervention felt to be needed at this time.  Past Medical History:  Diagnosis Date  . Anemia   . CAD (coronary artery disease)    a. s/p PCI in 1992 b. Coronary CT in 01/2018 showing extensive coronary calcification; FFR indeterminate --> medical management pursued at that time as patient not interested in repeat cath. c. s/p CABGx3 in 02/2019.  .  Cancer [facility])    prostate  . Cardiogenic shock ([place])   . CHB (complete heart block) (HCC)    a. due to vagal event in 2021, requiring temp pacer.  . Chronic combined systolic and diastolic CHF (congestive heart failure) ([place])   . Debilitated   . Dyspnea   . End stage renal disease ([place])   . Esophagitis   . H/O tracheostomy    a. during admit in 2021 for respiratory failure.  . Hematuria   . Hypertension   . Hypoalbuminemia   . Ischemic cardiomyopathy   . Myocardial infarction ([place])   . PAF (paroxysmal atrial fibrillation) ([place])   . Pleural effusion, left    a. s/p pleurX catheter  . Pseudomonas pneumonia ([place])   . Thrombocytopenia ([place])     Past Surgical History:  Procedure Laterality Date  . BASCILIC VEIN TRANSPOSITION Right 09/10/2019   Procedure: RIGHT ARM 1ST STAGE BASCILIC VEIN TRANSPOSITION;  Surgeon: Angelia Mould, MD;  Location: [place];  Service: Vascular;  Laterality: Right;  . CHEST TUBE INSERTION Left 04/23/2019   Procedure: INSERTION PLEURAL DRAINAGE CATHETER TO DRAIN LEFT PLEURAL EFFUSION;  Surgeon: Ivin Poot, MD;  Location: [place];  Service: Thoracic;  Laterality: Left;  . CLIPPING OF ATRIAL APPENDAGE N/A 03/16/2019   Procedure: CLIPPING OF LEFT  ATRIAL APPENDAGE - USING ATRICLIP SIZE 40;  Surgeon: Ivin Poot, MD;  Location: [place];  Service: Open Heart Surgery;  Laterality: N/A;  . COLONOSCOPY N/A 08/25/2017   Procedure: COLONOSCOPY;  Surgeon: Rogene Houston, MD;  [facility];  Service: Endoscopy;  Laterality: N/A;  . CORONARY ARTERY BYPASS GRAFT N/A 03/16/2019   Procedure: CORONARY ARTERY BYPASS GRAFTING (CABG), ON PUMP, TIMES THREE, USING LEFT INTERNAL MAMMARY ARTERY, RIGHT GREATER SAPHENOUS VEIN HARVESTED ENDOSCOPICALLY;  Surgeon: Ivin Poot, MD;  Location: [place];  Service: Open Heart Surgery;  Laterality: N/A;  swan only  . CYSTOSCOPY WITH FULGERATION N/A 04/30/2019   Procedure: CYSTOSCOPY WITH FULGERATION;  Surgeon: Irine Seal,  MD;  Location: [place];  Service: Urology;  Laterality: N/A;  . HERNIA REPAIR    . IR FLUORO GUIDE CV LINE LEFT  04/23/2019  . IR FLUORO GUIDE CV LINE RIGHT  05/24/2019  . IR US GUIDE VASC ACCESS LEFT  04/23/2019  . MAZE N/A 03/16/2019   Procedure: MAZE;  Surgeon: Ivin Poot, MD;  Location: [place];  Service: Open Heart Surgery;  Laterality: N/A;  . PLACEMENT OF IMPELLA LEFT VENTRICULAR ASSIST DEVICE Right 03/27/2019   Procedure: Placement Of Impella Left Ventricular Assist Device using ABIOMED Impella 5.5 with SmartAssist Device.;  Surgeon: Wonda Olds, MD;  [facility];  Service: Thoracic;  Laterality: Right;  . PROSTATE SURGERY    . REMOVAL OF IMPELLA LEFT VENTRICULAR ASSIST DEVICE N/A 04/10/2019   Procedure: REMOVAL OF IMPELLA LEFT VENTRICULAR ASSIST DEVICE WITH INSERTION OF RIGHT FEMORAL ARTERIAL LINE;  Surgeon: Ivin Poot, MD;  Location: [place];  Service: Open Heart Surgery;  Laterality: N/A;  . REMOVAL OF PLEURAL DRAINAGE CATHETER Left 07/02/2019   Procedure: REMOVAL OF PLEURAL DRAINAGE CATHETER;  Surgeon: Ivin Poot, MD;  Location: [place];  Service: Thoracic;  Laterality: Left;  . RIGHT/LEFT HEART CATH AND CORONARY ANGIOGRAPHY N/A 03/15/2019   Procedure: RIGHT/LEFT HEART CATH AND CORONARY ANGIOGRAPHY;  Surgeon: Burnell Blanks, MD;  Location: [place] [facility];  Service: Cardiovascular;  Laterality: N/A;  . TEE WITHOUT CARDIOVERSION N/A 03/16/2019   Procedure: TRANSESOPHAGEAL ECHOCARDIOGRAM (TEE);  Surgeon: [place] Gum, Collier Salina, MD;  Location: [place];  Service: Open Heart Surgery;  Laterality: N/A;  . TRACHEOSTOMY TUBE PLACEMENT N/A 03/27/2019   Procedure: TRACHEOSTOMY placed using [place] 8DCT Cuffed.;  Surgeon: Wonda Olds, MD;  [facility];  Service: Thoracic;  Laterality: N/A;  . TRANSURETHRAL RESECTION OF BLADDER NECK N/A 05/13/2019   Procedure: CYSTOSCOPY CLOT EVACUATION FULGRATION CYSTOGRAM AND INSTILLATION OF FORMALIN;  Surgeon: Lucas Mallow, MD;   Location: [place];  Service: Urology;  Laterality: N/A;    Prior to Admission medications   Medication Sig Start Date End Date Taking? Authorizing Provider  acetaminophen (TYLENOL) 500 MG tablet Take 500 mg by mouth every 6 (six) hours as needed for moderate pain.   Yes [provider]  aspirin 81 MG chewable tablet Chew 1 tablet by mouth in the morning. 05/21/19  Yes [provider]  Calcium Carbonate-Vitamin D 600-400 MG-UNIT tablet Take 1 tablet by mouth daily. 10/08/19 12/07/19 Yes [provider]  carboxymethylcellulose (REFRESH PLUS) 0.5 % SOLN Place 1 drop into both eyes 4 (four) times daily. 10/08/19  Yes [provider]  midodrine (PROAMATINE) 10 MG tablet Take 1 tablet by mouth 2 (two) times daily. 10/08/19 10/07/20 Yes [provider]  midodrine (PROAMATINE) 5 MG tablet Take 1 tablet by mouth 3 (three) times a week. Monday, Wednesday, Friday 10/10/19 10/09/20 Yes [provider]  mupirocin ointment (BACTROBAN) 2 % Apply 1 application topically in the morning, at noon, and at bedtime. For 7 days. Apply to "burn wounds" all over his body EXCEPT FOR  FACE. Please DO NOT apply to face. 10/08/19 10/15/19 Yes [provider]  nystatin ointment (MYCOSTATIN) Apply 1 application topically daily as needed for itching. Apply to Penis as needed if itching occurs. 06/22/19  Yes [provider]  predniSONE (DELTASONE) 10 MG tablet Take 7 tablets by mouth daily. 10/09/19  Yes [provider]  senna (SENOKOT) 8.6 MG tablet Take 1 tablet by mouth as needed. 10/08/19 10/07/20 Yes [provider]  sulfamethoxazole-trimethoprim (BACTRIM) 400-80 MG tablet Take 1 tablet by mouth 3 (three) times a week. Monday, Wednesday, Friday 10/08/19 12/08/19 Yes [provider]  triamcinolone cream (KENALOG) 0.1 % Apply 1 application topically 2 (two) times daily. Apply to skin that is intact. DO NOT apply to "burn wounds". 10/08/19 10/07/20 Yes  [provider]    Current Facility-Administered Medications  Medication Dose Route Frequency Provider Last Rate Last Admin  . acetaminophen (TYLENOL) tablet 650 mg  650 mg Oral Q6H PRN Aline August, MD       Or  . acetaminophen (TYLENOL) suppository 650 mg  650 mg Rectal Q6H PRN Alekh, Kshitiz, MD      . Chlorhexidine Gluconate Cloth 2 % PADS 6 each  6 each Topical Q0600 Roney Jaffe, MD      . fentaNYL (SUBLIMAZE) injection 12.5 mcg  12.5 mcg Intravenous Q2H PRN Aline August, MD   12.5 mcg at 10/13/19 1940  . midodrine (PROAMATINE) tablet 10 mg  10 mg Oral BID WC Aline August, MD   10 mg at 10/14/19 0841  . [START ON 10/15/2019] midodrine (PROAMATINE) tablet 5 mg  5 mg Oral Q M,W,F-HD Alekh, Kshitiz, MD      . mupirocin ointment (BACTROBAN) 2 % 1 application  1 application Topical TID Aline August, MD   1 application at 19/41/74 0843  . nystatin ointment (MYCOSTATIN) 1 application  1 application Topical Daily PRN Starla Link, Kshitiz, MD      . ondansetron (ZOFRAN) tablet 4 mg  4 mg Oral Q6H PRN Starla Link, Kshitiz, MD       Or  . ondansetron (ZOFRAN) injection 4 mg  4 mg Intravenous Q6H PRN Alekh, Kshitiz, MD      . oxyCODONE (Oxy IR/ROXICODONE) immediate release tablet 5 mg  5 mg Oral Q4H PRN Aline August, MD   5 mg at 10/14/19 0644  . polyvinyl alcohol (LIQUIFILM TEARS) 1.4 % ophthalmic solution 1 drop  1 drop Both Eyes QID Aline August, MD   1 drop at 10/14/19 0842  . predniSONE (DELTASONE) tablet 70 mg  70 mg Oral Q breakfast Aline August, MD   70 mg at 10/14/19 0842  . sodium chloride flush (NS) 0.9 % injection 10-40 mL  10-40 mL Intracatheter Q12H Davonna Belling, MD      . sodium chloride flush (NS) 0.9 % injection 10-40 mL  10-40 mL Intracatheter PRN Davonna Belling, MD      . Derrill Memo ON 10/15/2019] sulfamethoxazole-trimethoprim (BACTRIM DS) 800-160 MG per tablet 0.5 tablet  0.5 tablet Oral Once per day on Mon Wed Fri Alekh, Kshitiz, MD      . triamcinolone cream  (KENALOG) 0.1 % 1 application  1 application Topical BID Aline August, MD        Allergies as of 10/13/2019 - Review Complete 10/13/2019  Allergen Reaction Noted  . Atorvastatin Anaphylaxis and Rash 06/18/2019  . Midodrine hcl Rash 10/01/2019  . Amiodarone  07/06/2019  . Heparin Other (See Comments) 05/17/2019  . Reglan [metoclopramide]  07/06/2019    Family History  Problem Relation Age of Onset  . CAD Mother 22  . CAD Father 35  . CAD Sister   . CAD Brother     Social History   Socioeconomic History  . Marital status: Married    Spouse name: Not on file  . Number of children: 2  . Years of education: Not on file  . Highest education level: Not on file  Occupational History  . Occupation: Librarian, academic in supply    Comment: [employer]  Tobacco Use  . Smoking status: Former Smoker    Packs/day: 0.25    Years: 50.00    Pack years: 12.50    Types: Cigarettes    Quit date: 02/15/1990    Years since quitting: 29.6  . Smokeless tobacco: Never Used  Vaping Use  . Vaping Use: Never used  Substance and Sexual Activity  . Alcohol use: No  . Drug use: No  . Sexual activity: Not Currently  Other Topics Concern  . Not on file  Social History Narrative  . Not on file   Social Determinants of Health   Financial Resource Strain:   . Difficulty of Paying Living Expenses: Not on file  Food Insecurity:   . Worried About Charity fundraiser in the Last Year: Not on file  . Ran Out of Food in the Last Year: Not on file  Transportation Needs:   . Lack of Transportation (Medical): Not on file  . Lack of Transportation (Non-Medical): Not on file  Physical Activity:   . Days of Exercise per Week: Not on file  . Minutes of Exercise per Session: Not on file  Stress:   . Feeling of Stress : Not on file  Social Connections:   . Frequency of Communication with Friends and Family: Not on file  . Frequency of Social Gatherings with Friends and Family: Not on file  . Attends  Religious Services: Not on file  . Active Member of Clubs or Organizations: Not on file  . Attends Archivist Meetings: Not on file  . Marital Status: Not on file  Intimate Partner Violence:   . Fear of Current or Ex-Partner: Not on file  . Emotionally Abused: Not on file  . Physically Abused: Not on file  . Sexually Abused: Not on file    Review of Systems:  As in history of present illness.  Physical Exam: Vital signs in last 24 hours: Temp:  [97.5 F (36.4 C)-99 F (37.2 C)] 99 F (37.2 C) (08/29 0631) Pulse Rate:  [82-105] 105 (08/29 0631) Resp:  [16-20] 20 (08/29 0631) BP: (84-136)/(44-89) 109/53 (08/29 0631) SpO2:  [95 %-98 %] 98 % (08/29 0631) Weight:  [70.1 kg] 70.1 kg (08/28 1930) Last BM Date: 10/13/19 General:   Alert, chronically ill appearing elderly gentleman who is awake, otherwise pleasant and conversant. Head:  Normocephalic and atraumatic.  Significant crusting around the lips in both eyes. Abdomen: Nondistended.  Positive bowel sounds soft nontender.  Left inguinal hernia. Rectal: Done in ED.  Will repeat at the time of FS under sedation.  Intake/Output from previous day: 08/28 0701 - 08/29 0700 In: -  Out: -85  Intake/Output this shift: No intake/output data recorded.  Lab Results: Recent Labs    10/13/19 1015 10/13/19 2004 10/14/19 0746  WBC 15.7*  --  14.5*  HGB 9.0* 8.6* 8.1*  HCT 29.1* 28.0* 26.4*  PLT 258  --  214   BMET Recent Labs    10/13/19 1015 10/14/19 0746  NA 134* 135  K 4.4 4.5  CL 96* 95*  CO2 26 25  GLUCOSE 150* 84  BUN 68* 41*  CREATININE 4.05* 3.04*  CALCIUM 8.0* 8.0*   LFT Recent Labs    10/14/19 0746  PROT 4.6*  ALBUMIN 2.2*  AST 20  ALT 39  ALKPHOS 56  BILITOT 0.8   PT/INR Recent Labs    10/13/19 1015 10/14/19 0746  LABPROT 13.7 14.4  INR 1.1 1.2   Hepatitis Panel No results for input(s): HEPBSAG, HCVAB, HEPAIGM, HEPBIGM in the last 72 hours. C-Diff No results for input(s):  CDIFFTOX in the last 72 hours.  Studies/Results: CT ABDOMEN PELVIS WO CONTRAST  Result Date: 10/13/2019 CLINICAL DATA:  GI bleed. Jelly like blood in underwear this morning. Diffuse pain. EXAM: CT ABDOMEN AND PELVIS WITHOUT CONTRAST TECHNIQUE: Multidetector CT imaging of the abdomen and pelvis was performed following the standard protocol without IV contrast. COMPARISON:  04/13/2019 FINDINGS: Lower chest: Bilateral pleural effusions are noted, left greater than right. Hepatobiliary: No focal liver abnormality is seen. No gallstones, gallbladder wall thickening, or biliary dilatation. Pancreas: Unremarkable. No pancreatic ductal dilatation or surrounding inflammatory changes. Spleen: Normal in size without focal abnormality. Adrenals/Urinary Tract: Normal appearance of the adrenal glands. Small bilateral kidney stones are noted. The largest is in the upper pole of left kidney measuring 4 mm. No hydronephrosis identified bilaterally. Small focus of gas is noted within the urinary bladder. No focal bladder abnormality noted. Stomach/Bowel: The stomach appears normal. The appendix is visualized and appears normal. No bowel wall thickening, inflammation, or distension. There is a large left inguinal hernia containing a nonobstructed loop of sigmoid colon. Moderate hyperdense stool is identified within the colon and rectum. Vascular/Lymphatic: Aortic atherosclerosis. No aneurysm. No abdominopelvic adenopathy. Reproductive: Seed implants are noted within the prostate gland. Large right hydrocele noted, image 85/2. Other: Trace volume of free fluid noted within the abdomen and pelvis. No focal fluid collections identified. Musculoskeletal: Multilevel degenerative disc disease is noted within the lumbar spine. No acute or suspicious osseous findings. IMPRESSION: 1. No acute findings identified within the abdomen or pelvis. 2. Large left inguinal hernia containing a nonobstructed loop of sigmoid colon. 3. Large right  hydrocele. 4. Bilateral pleural effusions, left greater than right. 5. Small focus of gas noted within the urinary bladder. Correlate for any clinical signs or symptoms of cystitis. 6. Aortic atherosclerosis. Aortic Atherosclerosis (ICD10-I70.0). Electronically Signed   By: Kerby Moors M.D.   On: 10/13/2019 12:04   DG Chest Portable 1 View  Result Date: 10/13/2019 CLINICAL DATA:  Weakness.  Rectal bleeding. EXAM: PORTABLE CHEST 1 VIEW COMPARISON:  08/22/2019 FINDINGS: Previous median sternotomy with CABG procedure and left atrial clip placement. There is a left chest wall dialysis catheter with tips at the cavoatrial junction. Normal heart size. Persistent small left pleural effusion identified with pulmonary vascular congestion. No airspace opacities or frank pulmonary edema. Visualized osseous structures are unremarkable. IMPRESSION: Pulmonary vascular congestion and small left pleural effusion. Electronically Signed   By: Kerby Moors M.D.   On: 10/13/2019 11:38   Impression: 86 year old gentleman with numerous medical problems admitted to the hospital with hematochezia.  He has known radiation proctitis treated with only hemospray previously. In the setting of chronic constipation, I suspect he is bleeding from his anorectum.  I doubt diverticular etiology or other more proximal lesion with given colonoscopy findings 2 years ago. CT reassuring for other causes.  I do not think his left inguinal hernia has anything to  do with bleeding.  Recent leukocytosis likely steroid effect.  He is currently on no antiplatelet therapy and is not anticoagulated.  Recommendations: I have offered the patient a sigmoidoscopy tomorrow utilizing propofol to further evaluate as to the cause of rectal bleeding-rule out stercoral ulceration, etc.   He may benefit from argon plasma ablation of the abnormal rectal blood vessels as feasible/appropriate. The risks, benefits, limitations, alternatives and imponderables  have been reviewed with the patient. Questions have been answered.  He is agreeable.  Add Colace 100 mg twice daily to his regimen  Add MiraLAX 17 g orally daily.  Further recommendations to follow pending findings of sigmoidoscopy tomorrow.    Notice:  This dictation was prepared with Dragon dictation along with smaller phrase technology. Any transcriptional errors that result from this process are unintentional and may not be corrected upon review.

## 2019-10-14 NOTE — Progress Notes (Signed)
Patient ID: Jesse Murphy, male   DOB: 19-Mar-1928, 86 y.o.   MRN: [id number]  PROGRESS NOTE    Jesse Murphy  JQB:[id number] DOB: 01-11-1929 DOA: 10/13/2019 PCP: Leeanne Rio, MD   Brief Narrative:  86 y.o. male with medical history significant of end-stage renal disease on hemodialysis, paroxysmal A. fib, CAD/MI/ischemic cardiomyopathy/history of CABG in January 2021, history of tracheostomy temporarily, chronic systolic and diastolic heart failure, complete heart block requiring temporary pacemaker, prostate cancer, recent diagnosis of pemphigus vulgaris  causing diffuse skin peeling/rash for which he was initially hospitalized at [facility] on 10/01/2019 but was subsequently transferred to [facility] where he received a diagnosis and was discharged on oral prednisone presented on 10/13/2019 with rectal bleeding.    Hemoglobin 9 on presentation. CT of abdomen showed no acute findings identified within the abdomen or pelvis; large left inguinal hernia containing a nonobstructed loop of sigmoid colon; large right hydrocele.  GI/general surgery/nephrology were consulted.  Assessment & Plan:   Rectal bleeding Anemia of chronic disease: From renal failure -Most likely lower GI bleeding from diverticulosis/AVM.  Last colonoscopy in 2019 had shown diverticulosis/AVM -Hemoglobin 9 on presentation.  Not much below his baseline.  Hemoglobin 8.1 this morning. Monitor H&H. -GI has been consulted: Dr. Gala Romney; evaluation pending -Aspirin held. -IV fluids discontinued.  End-stage renal disease on hemodialysis -Missed his dialysis on 10/05/2019. His dialysis schedule is Tuesday Thursday and Saturday.    Nephrology following.  Patient had dialysis last night.  Recent diagnosis of?  Pemphigus vulgaris -Currently being treated with oral prednisone and 3 times a week Bactrim as per his recent hospitalization at [facility].  He has a follow-up appointment as an outpatient at [place] to the  patient's son who states that he is on prednisone for 4 weeks.  Currently on prednisone 70 mg daily which will be continued.  Leukocytosis -Most likely from recent steroid use.  Monitor  Large left inguinal hernia with nonobstructed loop of sigmoid colon -Seen on CAT scan.  Patient mildly tender in the inguinal region.  Consulted general surgery/Dr. Constance Haw  Hypotension -History of chronic hypotension.  Continue midodrine -Monitor blood pressure  History of CAD/chronic ischemic cardiomyopathy/CABG Chronic combined systolic and diastolic heart failure -Currently stable.  Volume managed by dialysis.    Held aspirin.  Outpatient follow-up with cardiology  Paroxysmal A. Fib -Currently not on anticoagulation.  with history of Maze procedure.  Currently intermittently tachycardic  History of prostate cancer -Treated with seed implants about 10 years ago  Generalized conditioning -PT eval.   -Palliative care consultation for goals of care discussion   DVT prophylaxis: SCDs Code Status:  More awake and communicative today and he confirms that he wants to be a DNR.   Family Communication: Spoke to son/daughter-in-law on phone on 10/05/2019. Disposition Plan: Status is: Inpatient  Remains inpatient appropriate because:Hemodynamically unstable and Inpatient level of care appropriate due to severity of illness   Dispo: The patient is from: Home              Anticipated d/c is to: Home              Anticipated d/c date is: 1 day              Patient currently is not medically stable to d/c.   Consultants: GI/general surgery/nephrology/palliative care  Procedures: None  Antimicrobials: None   Subjective: Patient seen and examined at bedside.  Complaining of rectal pain.  Had slight  rectal bleeding this morning.  Denies fever, worsening shortness of breath or chest pain.  Had dialysis last night.  Complains of some lower abdominal pain.  Objective: Vitals:   10/13/19 2245  10/13/19 2315 10/13/19 2318 10/14/19 0631  BP: (!) 94/56 (!) 90/52 (!) 119/56 (!) 109/53  Pulse: 88 84 100 (!) 105  Resp:    20  Temp:    99 F (37.2 C)  TempSrc:      SpO2:    98%  Weight:      Height:        Intake/Output Summary (Last 24 hours) at 10/14/2019 0837 Last data filed at 10/13/2019 2315 Gross per 24 hour  Intake -  Output -85 ml  Net 85 ml   Filed Weights   10/13/19 1630 10/13/19 1930  Weight: 70.1 kg 70.1 kg    Examination:  General exam: Appears calm and comfortable.  Elderly male.  Looks chronically ill Respiratory system: Bilateral decreased breath sounds at bases with scattered crackles Cardiovascular system: S1 & S2 heard, tachycardic  gastrointestinal system: Abdomen is nondistended, soft and mildly tender in the lower quadrant.  Normal bowel sounds heard. Genitourinary: Left inguinal hernia present, reducible with very minimal tenderness Extremities: No cyanosis, clubbing; bilateral lower extremity trace edema present Central nervous system: Awake, alert.  More communicative today but still a poor historian.  No focal neurological deficits. Moving extremities Skin: Scattered diffuse rash present with some resolution of desquamation Psychiatry: Anxious looking   Data Reviewed: I have personally reviewed following labs and imaging studies  CBC: Recent Labs  Lab 10/13/19 1015 10/13/19 2004 10/14/19 0746  WBC 15.7*  --  14.5*  NEUTROABS 14.2*  --   --   HGB 9.0* 8.6* 8.1*  HCT 29.1* 28.0* 26.4*  MCV 104.3*  --  104.8*  PLT 258  --  932   Basic Metabolic Panel: Recent Labs  Lab 10/13/19 1015  NA 134*  K 4.4  CL 96*  CO2 26  GLUCOSE 150*  BUN 68*  CREATININE 4.05*  CALCIUM 8.0*   GFR: Estimated Creatinine Clearance: 12.2 mL/min (A) (by C-G formula based on SCr of 4.05 mg/dL (H)). Liver Function Tests: Recent Labs  Lab 10/13/19 1015  AST 21  ALT 48*  ALKPHOS 58  BILITOT 0.6  PROT 4.7*  ALBUMIN 2.2*   No results for input(s):  LIPASE, AMYLASE in the last 168 hours. No results for input(s): AMMONIA in the last 168 hours. Coagulation Profile: Recent Labs  Lab 10/13/19 1015 10/14/19 0746  INR 1.1 1.2   Cardiac Enzymes: No results for input(s): CKTOTAL, CKMB, CKMBINDEX, TROPONINI in the last 168 hours. BNP (last 3 results) No results for input(s): PROBNP in the last 8760 hours. HbA1C: No results for input(s): HGBA1C in the last 72 hours. CBG: No results for input(s): GLUCAP in the last 168 hours. Lipid Profile: No results for input(s): CHOL, HDL, LDLCALC, TRIG, CHOLHDL, LDLDIRECT in the last 72 hours. Thyroid Function Tests: No results for input(s): TSH, T4TOTAL, FREET4, T3FREE, THYROIDAB in the last 72 hours. Anemia Panel: No results for input(s): VITAMINB12, FOLATE, FERRITIN, TIBC, IRON, RETICCTPCT in the last 72 hours. Sepsis Labs: No results for input(s): PROCALCITON, LATICACIDVEN in the last 168 hours.  Recent Results (from the past 240 hour(s))  SARS Coronavirus 2 by RT PCR (hospital order, performed in [facility] hospital lab) Nasopharyngeal Nasopharyngeal Swab     Status: None   Collection Time: 10/13/19 10:19 AM   Specimen: Nasopharyngeal Swab  Result  Value Ref Range Status   SARS Coronavirus 2 NEGATIVE NEGATIVE Final    Comment: (NOTE) SARS-CoV-2 target nucleic acids are NOT DETECTED.  The SARS-CoV-2 RNA is generally detectable in upper and lower respiratory specimens during the acute phase of infection. The lowest concentration of SARS-CoV-2 viral copies this assay can detect is 250 copies / mL. A negative result does not preclude SARS-CoV-2 infection and should not be used as the sole basis for treatment or other patient management decisions.  A negative result may occur with improper specimen collection / handling, submission of specimen other than nasopharyngeal swab, presence of viral mutation(s) within the areas targeted by this assay, and inadequate number of viral copies (<250  copies / mL). A negative result must be combined with clinical observations, patient history, and epidemiological information.  Fact Sheet for Patients:   StrictlyIdeas.no  Fact Sheet for Healthcare Providers: BankingDealers.co.za  This test is not yet approved or  cleared by the [place] FDA and has been authorized for detection and/or diagnosis of SARS-CoV-2 by FDA under an Emergency Use Authorization (EUA).  This EUA will remain in effect (meaning this test can be used) for the duration of the COVID-19 declaration under Section 564(b)(1) of the Act, 21 U.S.C. section 360bbb-3(b)(1), unless the authorization is terminated or revoked sooner.  Performed at [facility], [address], [place], [place] [address]          Radiology Studies: CT ABDOMEN PELVIS WO CONTRAST  Result Date: 10/13/2019 CLINICAL DATA:  GI bleed. Jelly like blood in underwear this morning. Diffuse pain. EXAM: CT ABDOMEN AND PELVIS WITHOUT CONTRAST TECHNIQUE: Multidetector CT imaging of the abdomen and pelvis was performed following the standard protocol without IV contrast. COMPARISON:  04/13/2019 FINDINGS: Lower chest: Bilateral pleural effusions are noted, left greater than right. Hepatobiliary: No focal liver abnormality is seen. No gallstones, gallbladder wall thickening, or biliary dilatation. Pancreas: Unremarkable. No pancreatic ductal dilatation or surrounding inflammatory changes. Spleen: Normal in size without focal abnormality. Adrenals/Urinary Tract: Normal appearance of the adrenal glands. Small bilateral kidney stones are noted. The largest is in the upper pole of left kidney measuring 4 mm. No hydronephrosis identified bilaterally. Small focus of gas is noted within the urinary bladder. No focal bladder abnormality noted. Stomach/Bowel: The stomach appears normal. The appendix is visualized and appears normal. No bowel wall thickening, inflammation, or  distension. There is a large left inguinal hernia containing a nonobstructed loop of sigmoid colon. Moderate hyperdense stool is identified within the colon and rectum. Vascular/Lymphatic: Aortic atherosclerosis. No aneurysm. No abdominopelvic adenopathy. Reproductive: Seed implants are noted within the prostate gland. Large right hydrocele noted, image 85/2. Other: Trace volume of free fluid noted within the abdomen and pelvis. No focal fluid collections identified. Musculoskeletal: Multilevel degenerative disc disease is noted within the lumbar spine. No acute or suspicious osseous findings. IMPRESSION: 1. No acute findings identified within the abdomen or pelvis. 2. Large left inguinal hernia containing a nonobstructed loop of sigmoid colon. 3. Large right hydrocele. 4. Bilateral pleural effusions, left greater than right. 5. Small focus of gas noted within the urinary bladder. Correlate for any clinical signs or symptoms of cystitis. 6. Aortic atherosclerosis. Aortic Atherosclerosis (ICD10-I70.0). Electronically Signed   By: Kerby Moors M.D.   On: 10/13/2019 12:04   DG Chest Portable 1 View  Result Date: 10/13/2019 CLINICAL DATA:  Weakness.  Rectal bleeding. EXAM: PORTABLE CHEST 1 VIEW COMPARISON:  08/22/2019 FINDINGS: Previous median sternotomy with CABG procedure and left atrial clip  placement. There is a left chest wall dialysis catheter with tips at the cavoatrial junction. Normal heart size. Persistent small left pleural effusion identified with pulmonary vascular congestion. No airspace opacities or frank pulmonary edema. Visualized osseous structures are unremarkable. IMPRESSION: Pulmonary vascular congestion and small left pleural effusion. Electronically Signed   By: Kerby Moors M.D.   On: 10/13/2019 11:38        Scheduled Meds: . Chlorhexidine Gluconate Cloth  6 each Topical Q0600  . midodrine  10 mg Oral BID WC  . [START ON 10/15/2019] midodrine  5 mg Oral Q M,W,F-HD  . mupirocin  ointment  1 application Topical TID  . polyvinyl alcohol  1 drop Both Eyes QID  . predniSONE  70 mg Oral Q breakfast  . sodium chloride flush  10-40 mL Intracatheter Q12H  . [START ON 10/15/2019] sulfamethoxazole-trimethoprim  0.5 tablet Oral Once per day on Mon Wed Fri  . triamcinolone cream  1 application Topical BID   Continuous Infusions:        Aline August, MD [facility] 10/14/2019, 8:37 AM

## 2019-10-14 NOTE — Progress Notes (Signed)
Advised the bedside nurse that the "pigtail" or purple lumen of the HD catheter can be used for central access. This is standard practice for [facility] and follows the same care practices for any central line use. She states she is not aware of an extra lumen does not "feel comfortable" using this line. I asked to have the HD nurse contact me when she arrives, the bedside nurse is not sure if the pt will be seen by HD today.  I suggested that she contact vascular wellness to place a PIV. [employer]

## 2019-10-15 ENCOUNTER — Inpatient Hospital Stay (HOSPITAL_COMMUNITY): Payer: Medicare Other | Admitting: Anesthesiology

## 2019-10-15 ENCOUNTER — Encounter (HOSPITAL_COMMUNITY)
Admission: EM | Disposition: A | Discharge status: Skilled Nursing Facility | Payer: Self-pay | Source: Home / Self Care | Attending: Internal Medicine

## 2019-10-15 DIAGNOSIS — Z992 Dependence on renal dialysis: Secondary | ICD-10-CM

## 2019-10-15 DIAGNOSIS — N186 End stage renal disease: Secondary | ICD-10-CM

## 2019-10-15 DIAGNOSIS — Z7189 Other specified counseling: Secondary | ICD-10-CM

## 2019-10-15 DIAGNOSIS — Z515 Encounter for palliative care: Secondary | ICD-10-CM

## 2019-10-15 HISTORY — PX: BIOPSY: SHX5522

## 2019-10-15 HISTORY — PX: FLEXIBLE SIGMOIDOSCOPY: SHX5431

## 2019-10-15 LAB — CBC WITH DIFFERENTIAL/PLATELET
Abs Immature Granulocytes: 0.16 10*3/uL — ABNORMAL HIGH (ref 0.00–0.07)
Basophils Absolute: 0 10*3/uL (ref 0.0–0.1)
Basophils Relative: 0 %
Eosinophils Absolute: 0 10*3/uL (ref 0.0–0.5)
Eosinophils Relative: 0 %
HCT: 23.9 % — ABNORMAL LOW (ref 39.0–52.0)
Hemoglobin: 7.2 g/dL — ABNORMAL LOW (ref 13.0–17.0)
Immature Granulocytes: 2 %
Lymphocytes Relative: 5 %
Lymphs Abs: 0.4 10*3/uL — ABNORMAL LOW (ref 0.7–4.0)
MCH: 32.1 pg (ref 26.0–34.0)
MCHC: 30.1 g/dL (ref 30.0–36.0)
MCV: 106.7 fL — ABNORMAL HIGH (ref 80.0–100.0)
Monocytes Absolute: 0.6 10*3/uL (ref 0.1–1.0)
Monocytes Relative: 6 %
Neutro Abs: 7.9 10*3/uL — ABNORMAL HIGH (ref 1.7–7.7)
Neutrophils Relative %: 87 %
Platelets: 179 10*3/uL (ref 150–400)
RBC: 2.24 MIL/uL — ABNORMAL LOW (ref 4.22–5.81)
RDW: 17.3 % — ABNORMAL HIGH (ref 11.5–15.5)
WBC: 9.1 10*3/uL (ref 4.0–10.5)
nRBC: 0 % (ref 0.0–0.2)

## 2019-10-15 LAB — BASIC METABOLIC PANEL
Anion gap: 11 (ref 5–15)
BUN: 55 mg/dL — ABNORMAL HIGH (ref 8–23)
CO2: 25 mmol/L (ref 22–32)
Calcium: 7.6 mg/dL — ABNORMAL LOW (ref 8.9–10.3)
Chloride: 97 mmol/L — ABNORMAL LOW (ref 98–111)
Creatinine, Ser: 4.16 mg/dL — ABNORMAL HIGH (ref 0.61–1.24)
GFR calc Af Amer: 14 mL/min — ABNORMAL LOW (ref >60)
GFR calc non Af Amer: 12 mL/min — ABNORMAL LOW (ref >60)
Glucose, Bld: 83 mg/dL (ref 70–99)
Potassium: 5.1 mmol/L (ref 3.5–5.1)
Sodium: 133 mmol/L — ABNORMAL LOW (ref 135–145)

## 2019-10-15 LAB — PHOSPHORUS: Phosphorus: 5.5 mg/dL — ABNORMAL HIGH (ref 2.5–4.6)

## 2019-10-15 SURGERY — SIGMOIDOSCOPY, FLEXIBLE
Anesthesia: General

## 2019-10-15 MED ORDER — PROPOFOL 500 MG/50ML IV EMUL
INTRAVENOUS | Status: DC | PRN
  Administered 2019-10-15: 150 ug/kg/min via INTRAVENOUS

## 2019-10-15 MED ORDER — LACTATED RINGERS IV SOLN: INTRAVENOUS | Status: DC | PRN

## 2019-10-15 MED ORDER — POLYETHYLENE GLYCOL 3350 17 G PO PACK
17.0000 g | PACK | Freq: Every day | ORAL | Status: DC
  Administered 2019-10-15 – 2019-10-17 (×2): 17 g via ORAL
  Filled 2019-10-15 (×4): qty 1

## 2019-10-15 MED ORDER — PROPOFOL 10 MG/ML IV BOLUS
INTRAVENOUS | Status: DC | PRN
  Administered 2019-10-15 (×2): 30 mg via INTRAVENOUS

## 2019-10-15 MED ORDER — SODIUM CHLORIDE 0.9 % IV SOLN: INTRAVENOUS | Status: DC

## 2019-10-15 MED ORDER — CHLORHEXIDINE GLUCONATE CLOTH 2 % EX PADS
6.0000 | MEDICATED_PAD | Freq: Every day | CUTANEOUS | Status: DC
  Administered 2019-10-15 – 2019-10-17 (×3): 6 via TOPICAL

## 2019-10-15 NOTE — Plan of Care (Addendum)
  Problem: Acute Rehab PT Goals(only PT should resolve) Goal: Pt Will Go Supine/Side To Sit Outcome: Progressing Flowsheets (Taken 10/15/2019 1126) Pt will go Supine/Side to Sit: . with minimal assist . with HOB elevated Goal: Patient Will Transfer Sit To/From Stand Outcome: Progressing Flowsheets (Taken 10/15/2019 1126) Patient will transfer sit to/from stand: with moderate assist Goal: Pt Will Transfer Bed To Chair/Chair To Bed Outcome: Progressing Flowsheets (Taken 10/15/2019 1126) Pt will Transfer Bed to Chair/Chair to Bed: with mod assist Goal: Pt Will Ambulate Outcome: Progressing Flowsheets (Taken 10/15/2019 1126) Pt will Ambulate: . 10 feet . with maximum assist . with rolling walker   11:27 AM , 10/15/19 Karlyn Agee, SPT Physical Therapy with [place]  [facility] [phone] office   During this treatment session, the therapist was present, participating in and directing the treatment.  1:46 PM, 10/15/19 Lonell Grandchild, MPT Physical Therapist with [facility] [phone] office [phone] mobile phone

## 2019-10-15 NOTE — NC FL2 (Signed)
[place] LEVEL OF CARE SCREENING TOOL     IDENTIFICATION  Patient Name: Jesse Murphy Birthdate: 1928/05/15 Sex: male Admission Date (Current Location): 10/13/2019  [facility] and [place] Number:  [employer] and Address:  [place] [address], [place]      Provider Number: [phone] [id number]  Attending Physician Name and Address:  Aline August, MD  Relative Name and Phone Number:  Jaliel Deavers (son) Ph: [phone]    Current Level of Care: Hospital Recommended Level of Care: [place] Prior Approval Number:    Date Approved/Denied:   PASRR Number: [id number]  Discharge Plan: SNF    Current Diagnoses: Patient Active Problem List   Diagnosis Date Noted  . Advanced care planning/counseling discussion   . End stage renal disease on dialysis ([place])   . Palliative care by specialist   . Goals of care, counseling/discussion   . Rectal bleeding 10/13/2019  . Inguinal hernia of left side without obstruction or gangrene   . 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 History of atrial fibrillation- treated surgically, no longer has it per the patient 10/01/2019  . Personal history of prostate cancer 10/01/2019  . Stevens-Johnson syndrome ([place]) 10/01/2019  . Abnormality of gait 09/24/2019  . History of pleural effusion 08/22/2019  . Pleural effusion on left 07/11/2019  . Hematuria 07/09/2019  . Postop check 06/27/2019  . Adult failure to thrive   . History of non-Hodgkin's lymphoma   . Chest tube in place   . Failure to thrive (child)   . Prediabetes   . Hypoalbuminemia due to protein-calorie malnutrition ([place])   . Failure to thrive in adult   . Allergic contact dermatitis   . ESRD on dialysis ([place])   . Dysphagia   . Thrombocytopenia ([place])   . Acute on chronic anemia   . Supplemental oxygen dependent   . Acute systolic congestive heart failure  ([place])   . Pressure ulcer 05/23/2019  . Debility 05/22/2019  . Palliative care encounter   . Pressure injury of skin 03/25/2019  . Acute respiratory failure with hypoxia ([place])   . S/P CABG x 3 03/16/2019  . Non-ST elevation (NSTEMI) myocardial infarction ([place])   . Acute on chronic combined systolic and diastolic CHF (congestive heart failure) ([place])   . Coronary artery disease due to lipid rich plaque   . Atrial fibrillation with RVR ([place]) 03/14/2019  . Atrial fibrillation with rapid ventricular response ([place]) 03/13/2019  . Chest pain 09/12/2018  . Chronic diastolic heart failure ([place]) 05/29/2018  . Hypertension 05/29/2018  . Non Hodgkin's lymphoma ([place]) 09/30/2017  . Heme positive stool 08/11/2017  . Chronic anticoagulation 07/05/2016  . CAD S/P percutaneous coronary angioplasty 07/05/2016  . Type 2 diabetes mellitus without complications ([place]) 67/01/4579  . PAF (paroxysmal atrial fibrillation) ([place]) 06/14/2016  . Chest pain in adult 06/14/2016  . Dyspnea on exertion 06/14/2016  . Allergic rhinitis 06/14/2016  . GERD (gastroesophageal reflux disease) 06/14/2016  . BPH (benign prostatic hyperplasia) 06/14/2016    Orientation RESPIRATION BLADDER Height & Weight     Self, Time, Situation, Place  Normal Incontinent Weight: 149 lb 11.1 oz (67.9 kg) Height:  5\' 8"  (172.7 cm)  BEHAVIORAL SYMPTOMS/MOOD NEUROLOGICAL BOWEL NUTRITION STATUS      Continent Diet (Heart healthy)  AMBULATORY STATUS COMMUNICATION OF NEEDS Skin   Extensive Assist Verbally Other (Comment) (Blisters: abdomen, back, arm; Cracking: bilateral arms, abdomen)  Personal Care Assistance Level of Assistance  Bathing, Feeding, Dressing Bathing Assistance: Limited assistance Feeding assistance: Independent Dressing Assistance: Limited assistance     Functional Limitations Info  Sight, Hearing, Speech Sight Info: Adequate Hearing Info: Adequate Speech Info: Adequate    SPECIAL CARE  FACTORS FREQUENCY  PT (By licensed PT)     PT Frequency: 5x's/week              Contractures Contractures Info: Not present    Additional Factors Info  Code Status, Allergies Code Status Info: DNR Allergies Info: Atorvastatin; Midodrine Hcl; Amiodarone; Heparin; Reglan (Metoclopramide)           Current Medications (10/15/2019):  This is the current hospital active medication list Current Facility-Administered Medications  Medication Dose Route Frequency Provider Last Rate Last Admin  . 0.9 %  sodium chloride infusion   Intravenous Continuous Daneil Dolin, MD      . 0.9 %  sodium chloride infusion   Intravenous Continuous Daneil Dolin, MD 20 mL/hr at 10/15/19 1453 New Bag at 10/15/19 1453  . [MAR Hold] acetaminophen (TYLENOL) tablet 650 mg  650 mg Oral Q6H PRN Aline August, MD       Or  . Doug Sou Hold] acetaminophen (TYLENOL) suppository 650 mg  650 mg Rectal Q6H PRN Aline August, MD      . Doug Sou Hold] Chlorhexidine Gluconate Cloth 2 % PADS 6 each  6 each Topical Q0600 Roney Jaffe, MD      . Doug Sou Hold] Chlorhexidine Gluconate Cloth 2 % PADS 6 each  6 each Topical Q0600 Donato Heinz, MD   6 each at 10/15/19 1218  . [MAR Hold] docusate sodium (COLACE) capsule 100 mg  100 mg Oral BID Daneil Dolin, MD   100 mg at 10/14/19 2229  . [MAR Hold] fentaNYL (SUBLIMAZE) injection 12.5 mcg  12.5 mcg Intravenous Q2H PRN Aline August, MD   12.5 mcg at 10/13/19 1940  . [MAR Hold] midodrine (PROAMATINE) tablet 10 mg  10 mg Oral BID WC Starla Link, Kshitiz, MD   10 mg at 10/14/19 1624  . [MAR Hold] midodrine (PROAMATINE) tablet 5 mg  5 mg Oral Q M,W,F-HD Aline August, MD      . Doug Sou Hold] mupirocin ointment (BACTROBAN) 2 % 1 application  1 application Topical TID Aline August, MD   1 application at 67/20/94 1218  . [MAR Hold] nystatin ointment (MYCOSTATIN) 1 application  1 application Topical Daily PRN Aline August, MD      . Doug Sou Hold] ondansetron (ZOFRAN) tablet 4 mg  4 mg  Oral Q6H PRN Aline August, MD       Or  . Doug Sou Hold] ondansetron (ZOFRAN) injection 4 mg  4 mg Intravenous Q6H PRN Aline August, MD      . Doug Sou Hold] oxyCODONE (Oxy IR/ROXICODONE) immediate release tablet 5 mg  5 mg Oral Q4H PRN Aline August, MD   5 mg at 10/14/19 1624  . polyethylene glycol (MIRALAX / GLYCOLAX) packet 17 g  17 g Oral Daily Rourk, [name], MD      . Doug Sou Hold] polyvinyl alcohol (LIQUIFILM TEARS) 1.4 % ophthalmic solution 1 drop  1 drop Both Eyes QID Aline August, MD   1 drop at 10/15/19 1219  . [MAR Hold] predniSONE (DELTASONE) tablet 70 mg  70 mg Oral Q breakfast Aline August, MD   70 mg at 10/14/19 0842  . [MAR Hold] sodium chloride flush (NS) 0.9 % injection 10-40 mL  10-40 mL Intracatheter Q12H Pickering,  Ovid Curd, MD   10 mL at 10/15/19 1254  . [MAR Hold] sodium chloride flush (NS) 0.9 % injection 10-40 mL  10-40 mL Intracatheter PRN Davonna Belling, MD      . Doug Sou Hold] sulfamethoxazole-trimethoprim (BACTRIM DS) 800-160 MG per tablet 0.5 tablet  0.5 tablet Oral Once per day on Mon Wed Fri Alekh, Kshitiz, MD      . Doug Sou Hold] triamcinolone cream (KENALOG) 0.1 % 1 application  1 application Topical BID Aline August, MD   1 application at 50/87/19 1000     Discharge Medications: Please see discharge summary for a list of discharge medications.  Relevant Imaging Results:  Relevant Lab Results:   Additional Information SSN: [id number]  Sherie Don, LCSW

## 2019-10-15 NOTE — Care Management Important Message (Signed)
Important Message  Patient Details  Name: Jesse Murphy MRN: [id number] Date of Birth: 07-09-1928   Medicare Important Message Given:  Yes     Tommy Medal 10/15/2019, 3:51 PM

## 2019-10-15 NOTE — Anesthesia Preprocedure Evaluation (Addendum)
Anesthesia Evaluation  Patient identified by MRN, date of birth, ID band Patient awake    Reviewed: Allergy & Precautions, NPO status , Patient's Chart, lab work & pertinent test results  Airway Mallampati: II  TM Distance: >3 FB Neck ROM: Full    Dental  (+) Lower Dentures   Pulmonary shortness of breath, pneumonia, former smoker,    Pulmonary exam normal breath sounds clear to auscultation       Cardiovascular Exercise Tolerance: Poor hypertension, Pt. on medications + CAD, + Past MI, + Cardiac Stents, + CABG and +CHF  Normal cardiovascular exam+ dysrhythmias Atrial Fibrillation  Rhythm:Regular Rate:Normal     Neuro/Psych    GI/Hepatic GERD  Medicated and Controlled,  Endo/Other  diabetes, Well Controlled, Type 2  Renal/GU DialysisRenal disease     Musculoskeletal   Abdominal   Peds  Hematology  (+) anemia ,   Anesthesia Other Findings   Reproductive/Obstetrics                          Anesthesia Physical Anesthesia Plan  ASA: IV  Anesthesia Plan: General   Post-op Pain Management:    Induction: Intravenous  PONV Risk Score and Plan: TIVA  Airway Management Planned: Nasal Cannula and Natural Airway  Additional Equipment:   Intra-op Plan:   Post-operative Plan:   Informed Consent: I have reviewed the patients History and Physical, chart, labs and discussed the procedure including the risks, benefits and alternatives for the proposed anesthesia with the patient or authorized representative who has indicated his/her understanding and acceptance.    Suspend DNR.   Dental advisory given  Plan Discussed with: CRNA and Surgeon  Anesthesia Plan Comments: (Patient agreed to get brief resuscitation if his heart stops during the procedure.)       Anesthesia Quick Evaluation

## 2019-10-15 NOTE — TOC Progression Note (Signed)
Transition of Care [facility]) - Progression Note   Patient Details  Name: JAYLAND NULL MRN: [id number] Date of Birth: [birth year]  [facility]) CM/SW [place], LCSW Phone Number: 10/15/2019, 4:05 PM  Clinical Narrative: PT evaluation recommended SNF for short-term rehab. CSW spoke with patient's son, Peighton [place], following palliative's consult with family. Son agreeable to patient being faxed out for SNF. FL2 completed and PASRR received. SNF referral faxed out; awaiting bed offers.  Expected Discharge Plan: [place] Barriers to Discharge: Continued Medical Work up  Expected Discharge Plan and Services Expected Discharge Plan: [place] Discharge Planning Services: CM Consult Living arrangements for the past 2 months: Single Family Home  Readmission Risk Interventions No flowsheet data found.

## 2019-10-15 NOTE — Progress Notes (Addendum)
Patient ID: Jesse Murphy, male   DOB: [birth year], 86 y.o.   MRN: [id number]  [facility] Progress Note    Subjective:    No new complaints   Objective:   BP (!) 113/57 (BP Location: Left Wrist)   Pulse 79   Temp 98 F (36.7 C) (Oral)   Resp 18   Ht 5\' 8"  (1.727 m)   Wt 67.9 kg   SpO2 97%   BMI 22.76 kg/m   Intake/Output: I/O last 3 completed shifts: In: 0  Out: -85    Intake/Output this shift:  No intake/output data recorded. Weight change:   Physical Exam: Gen: frail, chronically ill-appearing WM CVS: RRR no rub Resp: cta Abd: +BS, soft, NT/ND Ext: 2+ anasarca, RUE AVF +T/B  Labs: BMET Recent Labs  Lab 10/13/19 1015 10/14/19 0746 10/15/19 0617  NA 134* 135 133*  K 4.4 4.5 5.1  CL 96* 95* 97*  CO2 26 25 25   GLUCOSE 150* 84 83  BUN 68* 41* 55*  CREATININE 4.05* 3.04* 4.16*  ALBUMIN 2.2* 2.2*  --   CALCIUM 8.0* 8.0* 7.6*  PHOS  --   --  5.5*   CBC Recent Labs  Lab 10/13/19 1015 10/13/19 1015 10/13/19 2004 10/14/19 0746 10/14/19 2050 10/15/19 0617  WBC 15.7*  --   --  14.5*  --  9.1  NEUTROABS 14.2*  --   --   --   --  7.9*  HGB 9.0*   < > 8.6* 8.1* 7.6* 7.2*  HCT 29.1*   < > 28.0* 26.4* 24.5* 23.9*  MCV 104.3*  --   --  104.8*  --  106.7*  PLT 258  --   --  214  --  179   < > = values in this interval not displayed.      Medications:    . Chlorhexidine Gluconate Cloth  6 each Topical Q0600  . docusate sodium  100 mg Oral BID  . midodrine  10 mg Oral BID WC  . midodrine  5 mg Oral Q M,W,F-HD  . mupirocin ointment  1 application Topical TID  . polyethylene glycol  17 g Oral Daily  . polyvinyl alcohol  1 drop Both Eyes QID  . predniSONE  70 mg Oral Q breakfast  . sodium chloride flush  10-40 mL Intracatheter Q12H  . sulfamethoxazole-trimethoprim  0.5 tablet Oral Once per day on Mon Wed Fri  . triamcinolone cream  1 application Topical BID   Dialyzes at [facility], TTS EDW 69 kg, 4hours . HD Bath 2K, 2.25 ca,  Dialyzer , Heparin . Access [facility] TDC Mircera 225 mcg last on 8/26 Iron 50 mg once a week, last dose 8/26.  Assessment/ Plan:   1. GI bleed- plan for sigmoidoscopy per GI 2. ESRD Plan for HD tomorrow to keep on schedule. 3. Anemia: transfuse prn 4. CKD-MBD: cont with meds 5. Nutrition: npo for now 6. Hypertension: on midodrine for low BP 7. CHF- volume overloaded on exam despite being below edw.  UF as able and may require serial HD. 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  Donetta Potts, MD [place] Pager [phone] 10/15/2019, 8:58 AM

## 2019-10-15 NOTE — H&P (View-Only) (Signed)
Patient aware of flex sig planned for today. Has been NPO except sips with meds since midnight. Reviewed risks and benefits again with patient. Stated understanding and agreeable to proceed.

## 2019-10-15 NOTE — Evaluation (Addendum)
Physical Therapy Evaluation Patient Details Name: Jesse Murphy MRN: [id number] DOB: Feb 06, 1929 Today's Date: 10/15/2019   History of Present Illness  Jesse Murphy is a 86 y.o. male with medical history significant of end-stage renal disease on hemodialysis, paroxysmal A. fib, CAD/MI/ischemic cardiomyopathy/history of CABG in January 2021, history of tracheostomy temporarily, chronic, systolic and diastolic heart failure, complete heart block requiring temporary pacemaker, prostate cancer, recent diagnosis of pemphigus vulgaris  causing diffuse skin peeling/rash for which he was initially hospitalized at [facility] on 10/01/2019 but was subsequently transferred to [facility] where he received a diagnosis and was discharged on oral prednisone presented today with rectal bleeding.  Patient is a poor historian but states that he was passing clots this morning.  As per the patient's family on phone, they had noticed some blood few days ago when he was being cleaned.  He presented to the ED today because he was passing clots.  Also complains of lower abdominal and rectal bleeding, states that he feels there is a twisting in his rectal area and the pain comes and goes.  He has had intermittent cramping but has gotten worse today.  He denies any fever, chest pain, vomiting.  He was supposed to have dialysis today but missed it and states that he does not want to have dialysis today.  Denies worsening shortness of breath.  Feels weak.  Complains of nausea.    Clinical Impression  The patient had extensive BLE/BUE's weakness during all mobility and transfers today. He required mod/max assist for supine to sit transfer, and had dizziness upon sitting EOB that decreased without full resolution after 5 minutes. He reported 9/10 buttock pain while scooting EOB. He attempted to perform sit to stand transfer max assist but was unable to get his bottom off the bed due to the BLE weakness. Due to significant fatigue, the  patient performed sit to supine transfer max assist back in bed. Patient left in bed after therapy today requesting pain medications and food whenever medically cleared- RN notified. PLAN: The patient will continue to benefit from skilled physical therapy services in hospital at recommended venue below in order to improve balance, gait, and ADL's to promote independence in functional activities.     Follow Up Recommendations SNF;Supervision/Assistance - 24 hour    Equipment Recommendations  None recommended by PT    Recommendations for Other Services       Precautions / Restrictions Precautions Precautions: Fall;Sternal Precaution Booklet Issued: No Precaution Comments: incision in abdomen Restrictions Weight Bearing Restrictions: No      Mobility  Bed Mobility Overal bed mobility: Needs Assistance Bed Mobility: Supine to Sit     Supine to sit: Mod assist;Max assist     General bed mobility comments: slow labored movement, dizziness upon sitting decreased without full resolution within 5 minutes  Transfers Overall transfer level: Needs assistance Equipment used: Rolling walker (2 wheeled) Transfers: Sit to/from Stand Sit to Stand: Max assist         General transfer comment: BLE weakness, pt unable to lift behind from bed with max assist  Ambulation/Gait                Stairs            Wheelchair Mobility    Modified Rankin (Stroke Patients Only)       Balance Overall balance assessment: Needs assistance Sitting-balance support: Bilateral upper extremity supported;Feet supported Sitting balance-Leahy Scale: Fair   Postural control: Posterior lean (dizziness  sitting EOB) Standing balance support: Bilateral upper extremity supported;During functional activity Standing balance-Leahy Scale: Zero Standing balance comment: pt unable to bear weight through BLE due to extensive weakness                             Pertinent Vitals/Pain  Pain Assessment: 0-10 Pain Score: 9  Pain Location: behind Pain Descriptors / Indicators: Moaning;Sore;Discomfort;Grimacing Pain Intervention(s): Limited activity within patient's tolerance;Monitored during session;Premedicated before session;Patient requesting pain meds-RN notified    Home Living Family/patient expects to be discharged to:: Private residence Living Arrangements: Spouse/significant other;Other relatives Available Help at Discharge: Family;Available 24 hours/day;Personal care attendant;Available PRN/intermittently (wife help prn; caregiver staff help 24/7) Type of Home: House Home Access: Stairs to enter;Ramped entrance (4 stairs w railings in back can reach both; ramped entrance in front railings both sides can reach both) Entrance Stairs-Rails: Right;Left;Can reach both Entrance Stairs-Number of Steps: 4 Home Layout: One level Home Equipment: Walker - 2 wheels;Wheelchair - manual;Grab bars - tub/shower;Tub bench (uses either RW or manual wheelchair at home) Additional Comments: lift chair    Prior Function Level of Independence: Needs assistance   Gait / Transfers Assistance Needed: caregiver staff assists in transfers and gait prn; caregivers drive and shop for pt  ADL's / Homemaking Assistance Needed: pt independent in ADL's and homemaking        Hand Dominance   Dominant Hand: Right    Extremity/Trunk Assessment   Upper Extremity Assessment Upper Extremity Assessment: Generalized weakness    Lower Extremity Assessment Lower Extremity Assessment: RLE deficits/detail;LLE deficits/detail RLE Deficits / Details: hip flex 2/5, knee ext 2/5 LLE Deficits / Details: hip flex 3-/5, knee ext 3/5    Cervical / Trunk Assessment Cervical / Trunk Assessment: Normal  Communication      Cognition Arousal/Alertness: Suspect due to medications (pt reported lethargy due to pain medications) Behavior During Therapy: WFL for tasks assessed/performed Overall Cognitive  Status: Within Functional Limits for tasks assessed                                        General Comments      Exercises     Assessment/Plan    PT Assessment Patient needs continued PT services  PT Problem List Decreased strength;Decreased mobility;Decreased range of motion;Decreased activity tolerance;Decreased balance;Pain       PT Treatment Interventions Therapeutic exercise;Gait training;Balance training;Functional mobility training;Therapeutic activities;Patient/family education    PT Goals (Current goals can be found in the Care Plan section)  Acute Rehab PT Goals Patient Stated Goal: go home w assist PT Goal Formulation: With patient Time For Goal Achievement: 10/29/19 Potential to Achieve Goals: Good    Frequency Min 3X/week   Barriers to discharge        Co-evaluation               AM-PAC PT "6 Clicks" Mobility  Outcome Measure Help needed turning from your back to your side while in a flat bed without using bedrails?: A Little Help needed moving from lying on your back to sitting on the side of a flat bed without using bedrails?: A Lot Help needed moving to and from a bed to a chair (including a wheelchair)?: A Lot Help needed standing up from a chair using your arms (e.g., wheelchair or bedside chair)?: A Lot Help needed to walk in hospital  room?: A Lot Help needed climbing 3-5 steps with a railing? : Total 6 Click Score: 12    End of Session Equipment Utilized During Treatment: Gait belt Activity Tolerance: Patient limited by fatigue Patient left: in bed;with call bell/phone within reach;with bed alarm set Nurse Communication: Mobility status;Weight bearing status PT Visit Diagnosis: Unsteadiness on feet (R26.81);Muscle weakness (generalized) (M62.81);Other abnormalities of gait and mobility (R26.89);Difficulty in walking, not elsewhere classified (R26.2)    Time: 1771-1657 PT Time Calculation (min) (ACUTE ONLY): 36  min   Charges:   PT Evaluation $PT Eval Moderate Complexity: 1 Mod PT Treatments $Therapeutic Exercise: 23-37 mins        1:45 PM , 10/15/19 Karlyn Agee, SPT Physical Therapy with [place]  [facility] [phone] office   During this treatment session, the therapist was present, participating in and directing the treatment.  1:45 PM, 10/15/19 Lonell Grandchild, MPT Physical Therapist with [facility] [phone] office [phone] mobile phone

## 2019-10-15 NOTE — Progress Notes (Signed)
Patient aware of flex sig planned for today. Has been NPO except sips with meds since midnight. Reviewed risks and benefits again with patient. Stated understanding and agreeable to proceed.

## 2019-10-15 NOTE — Transfer of Care (Signed)
Immediate Anesthesia Transfer of Care Note  Patient: Jesse Murphy  Procedure(s) Performed: [place] with propofol (N/A ) BIOPSY  Patient Location: PACU  Anesthesia Type:General  Level of Consciousness: awake  Airway & Oxygen Therapy: Patient Spontanous Breathing  Post-op Assessment: Report given to RN  Post vital signs: Reviewed  Last Vitals:  Vitals Value Taken Time  BP 79/61 10/15/19 1548  Temp    Pulse    Resp 15 10/15/19 1549  SpO2    Vitals shown include unvalidated device data.  Last Pain:  Vitals:   10/15/19 1417  TempSrc: Oral  PainSc: 0-No pain         Complications: No complications documented.

## 2019-10-15 NOTE — Anesthesia Postprocedure Evaluation (Signed)
Anesthesia Post Note  Patient: Jesse Murphy  Procedure(s) Performed: FLEXIBLE SIGMOIDOSCOPY with propofol (N/A ) BIOPSY  Patient location during evaluation: PACU Anesthesia Type: General Level of consciousness: awake and alert and oriented Pain management: pain level controlled Vital Signs Assessment: post-procedure vital signs reviewed and stable Respiratory status: spontaneous breathing Cardiovascular status: blood pressure returned to baseline and stable Postop Assessment: no apparent nausea or vomiting Anesthetic complications: no   No complications documented.   Last Vitals:  Vitals:   10/15/19 1417 10/15/19 1458  BP: (!) 133/51 (!) 94/48  Pulse: 75   Resp: 16   Temp:    SpO2: 94%     Last Pain:  Vitals:   10/15/19 1417  TempSrc: Oral  PainSc: 0-No pain                 Nael Petrosyan

## 2019-10-15 NOTE — Progress Notes (Signed)
Patient ID: Jesse Murphy, male   DOB: December 29, 1928, 86 y.o.   MRN: [id number]  PROGRESS NOTE    Jesse Murphy  BMW:[id number] DOB: Jul 02, 1928 DOA: 10/13/2019 PCP: Jesse Rio, MD   Brief Narrative:  86 y.o. male with medical history significant of end-stage renal disease on hemodialysis, paroxysmal A. fib, CAD/MI/ischemic cardiomyopathy/history of CABG in January 2021, history of tracheostomy temporarily, chronic systolic and diastolic heart failure, complete heart block requiring temporary pacemaker, prostate cancer, recent diagnosis of pemphigus vulgaris  causing diffuse skin peeling/rash for which he was initially hospitalized at [facility] on 10/01/2019 but was subsequently transferred to [facility] where he received a diagnosis and was discharged on oral prednisone presented on 10/13/2019 with rectal bleeding.    Hemoglobin 9 on presentation. CT of abdomen showed no acute findings identified within the abdomen or pelvis; large left inguinal hernia containing a nonobstructed loop of sigmoid colon; large right hydrocele.  GI/general surgery/nephrology were consulted.  Assessment & Plan:   Rectal bleeding Anemia of chronic disease: From renal failure -Most likely lower GI bleeding from diverticulosis/AVM.  Last colonoscopy in 2019 had shown diverticulosis/AVM -Hemoglobin 9 on presentation.  Not much below his baseline.  Hemoglobin 7.6 last night.  Hemoglobin pending this morning.  Monitor H&H. -GI following: For possible sigmoidoscopy today -Aspirin held.  End-stage renal disease on hemodialysis -Missed his dialysis on 10/05/2019. His dialysis schedule is Tuesday Thursday and Saturday.    Nephrology following.  Dialysis as per nephrology schedule.  Recent diagnosis of?  Pemphigus vulgaris -Currently being treated with oral prednisone and 3 times a week Bactrim as per his recent hospitalization at [facility].  He has a follow-up appointment as an outpatient at [facility] to the  patient's son who states that he is on prednisone for 4 weeks.  Currently on prednisone 70 mg daily which will be continued.  Leukocytosis -Most likely from recent steroid use.  Monitor  Large left inguinal hernia with nonobstructed loop of sigmoid colon -Seen on CAT scan.  Patient mildly tender in the inguinal region.    General surgery evaluation appreciated: No need for any surgical intervention at this time  Hypotension -History of chronic hypotension.  Continue midodrine -Monitor blood pressure  History of CAD/chronic ischemic cardiomyopathy/CABG Chronic combined systolic and diastolic heart failure -Currently stable.  Volume managed by dialysis.    Held aspirin.  Outpatient follow-up with cardiology  Paroxysmal A. Fib -Currently not on anticoagulation.  with history of Maze procedure.  Currently intermittently tachycardic  History of prostate cancer -Treated with seed implants about 10 years ago  Generalized conditioning -PT eval.   -Palliative care consultation for goals of care discussion   DVT prophylaxis: SCDs Code Status:  DNR.   Family Communication: Spoke to son/daughter-in-law on phone on 10/05/2019. Disposition Plan: Status is: Inpatient  Remains inpatient appropriate because:Hemodynamically unstable and Inpatient level of care appropriate due to severity of illness.  For possible sigmoidoscopy today.   Dispo: The patient is from: Home              Anticipated d/c is to: Home              Anticipated d/c date is: 1 day              Patient currently is not medically stable to d/c.   Consultants: GI/general surgery/nephrology/palliative care  Procedures: None  Antimicrobials: None   Subjective: Patient seen and examined at bedside.  Denies current abdominal pain.  Still having some intermittent rectal bleeding.  Denies any worsening shortness of breath, chest pain or fever. Objective: Vitals:   10/14/19 0631 10/14/19 1625 10/15/19 0704 10/15/19  0706  BP: (!) 109/53 (!) 126/49 (!) 118/42 (!) 113/57  Pulse: (!) 105 83 78 79  Resp: 20 19 18    Temp: 99 F (37.2 C) 98 F (36.7 C)    TempSrc:  Oral    SpO2: 98% 98% 97%   Weight:    67.9 kg  Height:        Intake/Output Summary (Last 24 hours) at 10/15/2019 0807 Last data filed at 10/14/2019 1700 Gross per 24 hour  Intake 0 ml  Output --  Net 0 ml   Filed Weights   10/13/19 1630 10/13/19 1930 10/15/19 0706  Weight: 70.1 kg 70.1 kg 67.9 kg    Examination:  General exam: No acute distress.  Elderly male.  Looks chronically ill Respiratory system: Bilateral decreased breath sounds at bases with basilar crackles Cardiovascular system: S1 & S2 heard, intermittently tachycardic gastrointestinal system: Abdomen is nondistended, soft and currently nontender.  Normal bowel sounds heard. Genitourinary: Left inguinal hernia present, reducible with very minimal tenderness Extremities: Bilateral lower extremity edema present.  No clubbing  Central nervous system: Alert and oriented. No focal neurological deficits. Moving extremities Skin: Scattered diffuse rash present with some resolution of desquamation Psychiatry: Flat affect   Data Reviewed: I have personally reviewed following labs and imaging studies  CBC: Recent Labs  Lab 10/13/19 1015 10/13/19 2004 10/14/19 0746 10/14/19 2050  WBC 15.7*  --  14.5*  --   NEUTROABS 14.2*  --   --   --   HGB 9.0* 8.6* 8.1* 7.6*  HCT 29.1* 28.0* 26.4* 24.5*  MCV 104.3*  --  104.8*  --   PLT 258  --  214  --    Basic Metabolic Panel: Recent Labs  Lab 10/13/19 1015 10/14/19 0746  NA 134* 135  K 4.4 4.5  CL 96* 95*  CO2 26 25  GLUCOSE 150* 84  BUN 68* 41*  CREATININE 4.05* 3.04*  CALCIUM 8.0* 8.0*   GFR: Estimated Creatinine Clearance: 16.1 mL/min (A) (by C-G formula based on SCr of 3.04 mg/dL (H)). Liver Function Tests: Recent Labs  Lab 10/13/19 1015 10/14/19 0746  AST 21 20  ALT 48* 39  ALKPHOS 58 56  BILITOT 0.6  0.8  PROT 4.7* 4.6*  ALBUMIN 2.2* 2.2*   No results for input(s): LIPASE, AMYLASE in the last 168 hours. No results for input(s): AMMONIA in the last 168 hours. Coagulation Profile: Recent Labs  Lab 10/13/19 1015 10/14/19 0746  INR 1.1 1.2   Cardiac Enzymes: No results for input(s): CKTOTAL, CKMB, CKMBINDEX, TROPONINI in the last 168 hours. BNP (last 3 results) No results for input(s): PROBNP in the last 8760 hours. HbA1C: No results for input(s): HGBA1C in the last 72 hours. CBG: No results for input(s): GLUCAP in the last 168 hours. Lipid Profile: No results for input(s): CHOL, HDL, LDLCALC, TRIG, CHOLHDL, LDLDIRECT in the last 72 hours. Thyroid Function Tests: No results for input(s): TSH, T4TOTAL, FREET4, T3FREE, THYROIDAB in the last 72 hours. Anemia Panel: No results for input(s): VITAMINB12, FOLATE, FERRITIN, TIBC, IRON, RETICCTPCT in the last 72 hours. Sepsis Labs: No results for input(s): PROCALCITON, LATICACIDVEN in the last 168 hours.  Recent Results (from the past 240 hour(s))  SARS Coronavirus 2 by RT PCR (hospital order, performed in [facility] hospital lab) Nasopharyngeal Nasopharyngeal Swab  Status: None   Collection Time: 10/13/19 10:19 AM   Specimen: Nasopharyngeal Swab  Result Value Ref Range Status   SARS Coronavirus 2 NEGATIVE NEGATIVE Final    Comment: (NOTE) SARS-CoV-2 target nucleic acids are NOT DETECTED.  The SARS-CoV-2 RNA is generally detectable in upper and lower respiratory specimens during the acute phase of infection. The lowest concentration of SARS-CoV-2 viral copies this assay can detect is 250 copies / mL. A negative result does not preclude SARS-CoV-2 infection and should not be used as the sole basis for treatment or other patient management decisions.  A negative result may occur with improper specimen collection / handling, submission of specimen other than nasopharyngeal swab, presence of viral mutation(s) within the areas  targeted by this assay, and inadequate number of viral copies (<250 copies / mL). A negative result must be combined with clinical observations, patient history, and epidemiological information.  Fact Sheet for Patients:   StrictlyIdeas.no  Fact Sheet for Healthcare Providers: BankingDealers.co.za  This test is not yet approved or  cleared by the [place] FDA and has been authorized for detection and/or diagnosis of SARS-CoV-2 by FDA under an Emergency Use Authorization (EUA).  This EUA will remain in effect (meaning this test can be used) for the duration of the COVID-19 declaration under Section 564(b)(1) of the Act, 21 U.S.C. section 360bbb-3(b)(1), unless the authorization is terminated or revoked sooner.  Performed at [facility], [address], [place], [place] [address]          Radiology Studies: CT ABDOMEN PELVIS WO CONTRAST  Result Date: 10/13/2019 CLINICAL DATA:  GI bleed. Jelly like blood in underwear this morning. Diffuse pain. EXAM: CT ABDOMEN AND PELVIS WITHOUT CONTRAST TECHNIQUE: Multidetector CT imaging of the abdomen and pelvis was performed following the standard protocol without IV contrast. COMPARISON:  04/13/2019 FINDINGS: Lower chest: Bilateral pleural effusions are noted, left greater than right. Hepatobiliary: No focal liver abnormality is seen. No gallstones, gallbladder wall thickening, or biliary dilatation. Pancreas: Unremarkable. No pancreatic ductal dilatation or surrounding inflammatory changes. Spleen: Normal in size without focal abnormality. Adrenals/Urinary Tract: Normal appearance of the adrenal glands. Small bilateral kidney stones are noted. The largest is in the upper pole of left kidney measuring 4 mm. No hydronephrosis identified bilaterally. Small focus of gas is noted within the urinary bladder. No focal bladder abnormality noted. Stomach/Bowel: The stomach appears normal. The appendix is  visualized and appears normal. No bowel wall thickening, inflammation, or distension. There is a large left inguinal hernia containing a nonobstructed loop of sigmoid colon. Moderate hyperdense stool is identified within the colon and rectum. Vascular/Lymphatic: Aortic atherosclerosis. No aneurysm. No abdominopelvic adenopathy. Reproductive: Seed implants are noted within the prostate gland. Large right hydrocele noted, image 85/2. Other: Trace volume of free fluid noted within the abdomen and pelvis. No focal fluid collections identified. Musculoskeletal: Multilevel degenerative disc disease is noted within the lumbar spine. No acute or suspicious osseous findings. IMPRESSION: 1. No acute findings identified within the abdomen or pelvis. 2. Large left inguinal hernia containing a nonobstructed loop of sigmoid colon. 3. Large right hydrocele. 4. Bilateral pleural effusions, left greater than right. 5. Small focus of gas noted within the urinary bladder. Correlate for any clinical signs or symptoms of cystitis. 6. Aortic atherosclerosis. Aortic Atherosclerosis (ICD10-I70.0). Electronically Signed   By: Kerby Moors M.D.   On: 10/13/2019 12:04   DG Chest Portable 1 View  Result Date: 10/13/2019 CLINICAL DATA:  Weakness.  Rectal bleeding. EXAM: PORTABLE CHEST  1 VIEW COMPARISON:  08/22/2019 FINDINGS: Previous median sternotomy with CABG procedure and left atrial clip placement. There is a left chest wall dialysis catheter with tips at the cavoatrial junction. Normal heart size. Persistent small left pleural effusion identified with pulmonary vascular congestion. No airspace opacities or frank pulmonary edema. Visualized osseous structures are unremarkable. IMPRESSION: Pulmonary vascular congestion and small left pleural effusion. Electronically Signed   By: Kerby Moors M.D.   On: 10/13/2019 11:38        Scheduled Meds: . Chlorhexidine Gluconate Cloth  6 each Topical Q0600  . docusate sodium  100 mg  Oral BID  . midodrine  10 mg Oral BID WC  . midodrine  5 mg Oral Q M,W,F-HD  . mupirocin ointment  1 application Topical TID  . polyethylene glycol  17 g Oral Daily  . polyvinyl alcohol  1 drop Both Eyes QID  . predniSONE  70 mg Oral Q breakfast  . sodium chloride flush  10-40 mL Intracatheter Q12H  . sulfamethoxazole-trimethoprim  0.5 tablet Oral Once per day on Mon Wed Fri  . triamcinolone cream  1 application Topical BID   Continuous Infusions:        Aline August, MD [facility] 10/15/2019, 8:07 AM

## 2019-10-15 NOTE — Op Note (Signed)
[facility] Patient Name: Jesse Murphy Procedure Date: 10/15/2019 3:02 PM MRN: [id number] Date of Birth: April 21, 1928 Attending MD: Norvel Richards , MD CSN: [id number] Age: 86 Admit Type: Outpatient Procedure:                Flexible Sigmoidoscopy Indications:              Hematochezia; constipation Providers:                Norvel Richards, MD, Lambert Mody, Caprice Kluver, Randa Spike, Technician Referring MD:             Hospitalist Medicines:                Propofol per Anesthesia Complications:            No immediate complications. Estimated Blood Loss:     Estimated blood loss was minimal. Procedure:                Pre-Anesthesia Assessment:                           - Prior to the procedure, a History and Physical                            was performed, and patient medications and                            allergies were reviewed. The patient's tolerance of                            previous anesthesia was also reviewed. The risks                            and benefits of the procedure and the sedation                            options and risks were discussed with the patient.                            All questions were answered, and informed consent                            was obtained. Prior Anticoagulants: The patient has                            taken no previous anticoagulant or antiplatelet                            agents. ASA Grade Assessment: III - A patient with                            severe systemic disease. After reviewing the risks  and benefits, the patient was deemed in                            satisfactory condition to undergo the procedure.                           After obtaining informed consent, the scope was                            passed under direct vision. The [id number] ([id number])                            scope was introduced through the anus and advanced                             to the the rectosigmoid junction. Scope In: 3:29:25 PM Scope Out: 3:37:43 PM Total Procedure Duration: 0 hours 8 minutes 18 seconds  Findings:      Rather large area of ulceration in the semilunar distribution involving       the most distal rectal mucosa abutting the anal verge. Please see       multiple photographs. Proximal to this area was normal-appearing rectum       to the rectosigmoid junction where stool occluded the lumen. No       retroflexion was attempted. Changes consistent with radiation proctitis       were not apparent. Conservative biopsies of the ulcerated rectal mucosa       were taken. Impression:               -Extensively ulcerated distal rectal mucosa as                            described?"status post biopsy. Inflammation abuts                            the anal verge. In this clinical setting, need to                            consider solitary rectal ulcer syndrome although a                            stercoral component is not excluded. Moreover, it                            is not clear whether or not his ongoing severe                            desquamating skin disease is a contributing factor.                            Less likely in the differential would be ischemia                            or an infectious process.  Ulceration not consistent with radiation proctitis. Moderate Sedation:      Moderate (conscious) sedation was personally administered by an       anesthesia professional. The following parameters were monitored: oxygen       saturation, heart rate, blood pressure, respiratory rate, EKG, adequacy       of pulmonary ventilation, and response to care. Recommendation:           - Return patient to hospital ward for ongoing care.                            Follow-up on pathology. Continue Colace 100 mg                            twice daily and MiraLAX 17 g orally to avoid                             constipation. Patient already receiving high-dose                            oral corticosteroid therapy. Will advance to a soft                            diet. Further recommendations to follow. Procedure Code(s):        --- Professional ---                           [phone], 52, Sigmoidoscopy, flexible; diagnostic,                            including collection of specimen(s) by brushing or                            washing, when performed (separate procedure) Diagnosis Code(s):        --- Professional ---                           K92.1, Melena (includes Hematochezia) CPT copyright 2019 American Medical Association. All rights reserved. The codes documented in this report are preliminary and upon coder review may  be revised to meet current compliance requirements. Jesse Murphy. Jesse Diodato, MD Norvel Richards, MD 10/15/2019 4:07:29 PM This report has been signed electronically. Number of Addenda: 0

## 2019-10-15 NOTE — Interval H&P Note (Signed)
History and Physical Interval Note:  10/15/2019 3:11 PM  Jesse Murphy  has presented today for surgery, with the diagnosis of hematochezia.  The various methods of treatment have been discussed with the patient and family. After consideration of risks, benefits and other options for treatment, the patient has consented to  Procedure(s): FLEXIBLE SIGMOIDOSCOPY with propofol (N/A) as a surgical intervention.  The patient's history has been reviewed, patient examined, no change in status, stable for surgery.  I have reviewed the patient's chart and labs.  Questions were answered to the patient's satisfaction.     Manus Rudd  Patient seen and examined in short stay.  Doing some better today.  Hungry.  Flexible sigmoidoscopy today as planned.  The risks, benefits, limitations, alternatives and imponderables have been reviewed with the patient. Questions have been answered. All parties are agreeable.  Further recommendations to follow.

## 2019-10-15 NOTE — Consult Note (Signed)
**Note DeJesseIdentified via Obfuscation** Consultation Note Date: 10/15/2019   Patient Name: Jesse Murphy  DOB: [id number]  MRN: [id number]  Age / Sex: 86 y.o., male  PCP: Leeanne Rio, MD Referring Physician: Aline August, MD  Reason for Consultation: Establishing goals of care  HPI/Patient Profile: 86 y.o. 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 Scheduled for sigmoidoscopy today. Palliative medicine consulted for [place].     Clinical Assessment and Goals of Care: Patient seen earlier this year by Palliative- per Jesse Murphy, [place] note- "Jesse Murphy had a long career in the [employer].  He fought in [place].  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  Consequently he is very hard of hearing.   After the [employer] the patient worked for [employer].  He and his wife Jesse Murphy raised [place] and enjoyed a life of hard work and family life.  He is a [place] man and loves Psalms.  He listens to "[employer]" music.   The couple has two children Jesse Murphy and Jesse Murphy.  Jesse Murphy has been very helpful to her father with Oncology and Cardiology appointments.     In 2020 the patient's wife Jesse Murphy had several strokes and needed help with ADLs. [name] is her primary care taker.  Fortunately Jesse Murphy and her family live next door to the patient and Jesse Murphy lives just 6 miles away. The family is very close knit."  Evaluated Jesse Murphy at bedside. Pleasant. States his goals are to continue current therapy and wishes to go home with his son and daughter in law who work hard with him on therapy. If he were at home and required rehospitalization he would wish to do that. He wishes to continue with dialysis.   Called his son and daughter in law- they note that patient was making improvements  with physical therapy earlier in the month prior to his hospitalization for pemphigus- they note this was a setback for him. Their feeling is that if Jesse Murphy were to continue with PT he would get stronger- however, sometimes Jesse Murphy does not do recommended PT on his own.   We discussed that with his chronic illnesses there may come a time when he is not "rehabable" and continuing PT may not improve his quality of life.   We discussed ongoing aggressive care- possible d/c to SNF as recommended by PT. We discussed what the goals are for Jesse Murphy. Patient's son notes that Jesse Murphy has stated he wished to walk again.   For now- GOC are to treat what is treatable, continue HD, go to SNF for rehab- if patient does not do well with rehab- we discussed that Hospice may be appropriate. Hospice services and philosophy were discussed.   Primary Decision Maker PATIENT    SUMMARY OF RECOMMENDATIONS -GOC- treat what is treatable- hopeful for discharge to rehab for short term PT with goals of walking  and returning home -Recommend outpatient Palliative to follow at discharge and continue [place] discussion -If patient fails to improve with rehab or declines further- recommend transition home with Hospice- this was discussed with family    Code Status/Advance Care Planning:  DNR  Palliative Prophylaxis:   Bowel Regimen and Frequent Pain Assessment  Additional Recommendations (Limitations, Scope, Preferences):  Full Scope Treatment  Prognosis:    Unable to determine given decline in functional status and continued insults to frail patient I worry patient is at 6 months of life regardless of ongoing aggressive medical care  Discharge Planning: [place] for rehab with Palliative care service followJesseup  Primary Diagnoses: Present on Admission: . Rectal bleeding . Chronic diastolic heart failure ([place])   I have reviewed the medical record, interviewed the patient and family, and examined  the patient. The following aspects are pertinent.  Past Medical History:  Diagnosis Date  . Anemia   . CAD (coronary artery disease)    a. s/p PCI in 1992 b. Coronary CT in 01/2018 showing extensive coronary calcification; FFR indeterminate --> medical management pursued at that time as patient not interested in repeat cath. c. s/p CABGx3 in 02/2019.  . Cancer [facility])    prostate  . Cardiogenic shock ([place])   . CHB (complete heart block) (HCC)    a. due to vagal event in 2021, requiring temp pacer.  . Chronic combined systolic and diastolic CHF (congestive heart failure) ([place])   . Debilitated   . Dyspnea   . End stage renal disease ([place])   . Esophagitis   . H/O tracheostomy    a. during admit in 2021 for respiratory failure.  . Hematuria   . Hypertension   . Hypoalbuminemia   . Ischemic cardiomyopathy   . Myocardial infarction ([employer])   . PAF (paroxysmal atrial fibrillation) ([place])   . Pleural effusion, left    a. s/p pleurX catheter  . Pseudomonas pneumonia ([place])   . Thrombocytopenia ([place])    Social History   Socioeconomic History  . Marital status: Married    Spouse name: Not on file  . Number of children: 2  . Years of education: Not on file  . Highest education level: Not on file  Occupational History  . Occupation: Librarian, academic in supply    Comment: [employer]  Tobacco Use  . Smoking status: Former Smoker    Packs/day: 0.25    Years: 50.00    Pack years: 12.50    Types: Cigarettes    Quit date: 02/15/1990    Years since quitting: 29.6  . Smokeless tobacco: Never Used  Vaping Use  . Vaping Use: Never used  Substance and Sexual Activity  . Alcohol use: No  . Drug use: No  . Sexual activity: Not Currently  Other Topics Concern  . Not on file  Social History Narrative  . Not on file   Social Determinants of Health   Financial Resource Strain:   . Difficulty of Paying Living Expenses: Not on file  Food Insecurity:   . Worried About Charity fundraiser in the  Last Year: Not on file  . Ran Out of Food in the Last Year: Not on file  Transportation Needs:   . Lack of Transportation (Medical): Not on file  . Lack of Transportation (NonJesseMedical): Not on file  Physical Activity:   . Days of Exercise per Week: Not on file  . Minutes of Exercise per Session: Not on file  Stress:   .  Feeling of Stress : Not on file  Social Connections:   . Frequency of Communication with Friends and Family: Not on file  . Frequency of Social Gatherings with Friends and Family: Not on file  . Attends Religious Services: Not on file  . Active Member of Clubs or Organizations: Not on file  . Attends Archivist Meetings: Not on file  . Marital Status: Not on file   Family History  Problem Relation Age of Onset  . CAD Mother 56  . CAD Father 59  . CAD Sister   . CAD Brother    Scheduled Meds: . Chlorhexidine Gluconate Cloth  6 each Topical Q0600  . Chlorhexidine Gluconate Cloth  6 each Topical Q0600  . docusate sodium  100 mg Oral BID  . midodrine  10 mg Oral BID WC  . midodrine  5 mg Oral Q M,W,FJesseHD  . mupirocin ointment  1 application Topical TID  . polyethylene glycol  17 g Oral Daily  . polyvinyl alcohol  1 drop Both Eyes QID  . predniSONE  70 mg Oral Q breakfast  . sodium chloride flush  10Jesse40 mL Intracatheter Q12H  . sulfamethoxazoleJessetrimethoprim  0.5 tablet Oral Once per day on Mon Wed Fri  . triamcinolone cream  1 application Topical BID   Continuous Infusions: PRN Meds:.acetaminophen **OR** acetaminophen, fentaNYL (SUBLIMAZE) injection, nystatin ointment, ondansetron **OR** ondansetron (ZOFRAN) IV, oxyCODONE, sodium chloride flush Medications Prior to Admission:  Prior to Admission medications   Medication Sig Start Date End Date Taking? Authorizing Provider  acetaminophen (TYLENOL) 500 MG tablet Take 500 mg by mouth every 6 (six) hours as needed for moderate pain.   Yes [provider]  aspirin 81 MG chewable tablet Chew 1  tablet by mouth in the morning. 05/21/19  Yes [provider]  Calcium CarbonateJesseVitamin D 600Jesse400 MGJesseUNIT tablet Take 1 tablet by mouth daily. 10/08/19 12/07/19 Yes [provider]  carboxymethylcellulose (REFRESH PLUS) 0.5 % SOLN Place 1 drop into both eyes 4 (four) times daily. 10/08/19  Yes [provider]  midodrine (PROAMATINE) 10 MG tablet Take 1 tablet by mouth 2 (two) times daily. 10/08/19 10/07/20 Yes [provider]  midodrine (PROAMATINE) 5 MG tablet Take 1 tablet by mouth 3 (three) times a week. Monday, Wednesday, Friday 10/10/19 10/09/20 Yes [provider]  mupirocin ointment (BACTROBAN) 2 % Apply 1 application topically in the morning, at noon, and at bedtime. For 7 days. Apply to "burn wounds" all over his body [place]. Please DO NOT apply to face. 10/08/19 10/15/19 Yes [provider]  nystatin ointment (MYCOSTATIN) Apply 1 application topically daily as needed for itching. Apply to Penis as needed if itching occurs. 06/22/19  Yes [provider]  predniSONE (DELTASONE) 10 MG tablet Take 7 tablets by mouth daily. 10/09/19  Yes [provider]  senna (SENOKOT) 8.6 MG tablet Take 1 tablet by mouth as needed. 10/08/19 10/07/20 Yes [provider]  sulfamethoxazoleJessetrimethoprim (BACTRIM) 400Jesse80 MG tablet Take 1 tablet by mouth 3 (three) times a week. Monday, Wednesday, Friday 10/08/19 12/08/19 Yes [provider]  triamcinolone cream (KENALOG) 0.1 % Apply 1 application topically 2 (two) times daily. Apply to skin that is intact. DO NOT apply to "burn wounds". 10/08/19 10/07/20 Yes [provider]   Allergies  Allergen Reactions  . Atorvastatin Anaphylaxis and Rash    Leg pain  . Midodrine Hcl Rash    Severe sloughing body skin rash 3 days after starting midodrine in  Aug 2021  . Amiodarone     Rash (see 05/2019 hospital notes), but was on other medications at the time so unclear which agent   .  Heparin Other (See Comments)    Thrombocytopenia/ HIT  . Reglan [Metoclopramide]     Rash (see 05/2019 hospital notes), but was on other medications at the time so unclear which agent   Review of Systems  Constitutional: Positive for activity change and fatigue.  Gastrointestinal: Positive for blood in stool.    Physical Exam Vitals and nursing note reviewed.  Constitutional:      General: He is not in acute distress. Neurological:     Mental Status: He is alert and oriented to person, place, and time.  Psychiatric:        Mood and Affect: Mood normal.        Thought Content: Thought content normal.     Vital Signs: BP (!) 113/57 (BP Location: Left Wrist)   Pulse 79   Temp 98 F (36.7 C) (Oral)   Resp 18   Ht 5\' 8"  (1.727 m)   Wt 67.9 kg   SpO2 97%   BMI 22.76 kg/m  Pain Scale: 0Jesse10   Pain Score: 0JesseNo pain   SpO2: SpO2: 97 % O2 Device:SpO2: 97 % O2 Flow Rate: .   IO: Intake/output summary:   Intake/Output Summary (Last 24 hours) at 10/15/2019 1304 Last data filed at 10/14/2019 1700 Gross per 24 hour  Intake 0 ml  Output --  Net 0 ml    LBM: Last BM Date: 10/14/19 Baseline Weight: Weight: 70.1 kg Most recent weight: Weight: 67.9 kg     Palliative Assessment/Data: PPS: 30%     Thank you for this consult. Palliative medicine will continue to follow and assist as needed.    Time In: 0948 Time Out: 1101 Time Total: 83 minutes Greater than 50%  of this time was spent counseling and coordinating care related to the above assessment and plan.  Signed by: Mariana Kaufman, AGNPJesseC Palliative Medicine    Please contact Palliative Medicine Team phone at [phone] for questions and concerns.  For individual provider: See Shea Evans

## 2019-10-16 ENCOUNTER — Other Ambulatory Visit: Payer: Self-pay

## 2019-10-16 ENCOUNTER — Encounter (HOSPITAL_COMMUNITY): Payer: Self-pay | Admitting: Internal Medicine

## 2019-10-16 DIAGNOSIS — Z992 Dependence on renal dialysis: Secondary | ICD-10-CM

## 2019-10-16 DIAGNOSIS — N186 End stage renal disease: Secondary | ICD-10-CM

## 2019-10-16 LAB — CBC WITH DIFFERENTIAL/PLATELET
Abs Immature Granulocytes: 0.14 10*3/uL — ABNORMAL HIGH (ref 0.00–0.07)
Basophils Absolute: 0 10*3/uL (ref 0.0–0.1)
Basophils Relative: 0 %
Eosinophils Absolute: 0 10*3/uL (ref 0.0–0.5)
Eosinophils Relative: 1 %
HCT: 23.2 % — ABNORMAL LOW (ref 39.0–52.0)
Hemoglobin: 7 g/dL — ABNORMAL LOW (ref 13.0–17.0)
Immature Granulocytes: 2 %
Lymphocytes Relative: 9 %
Lymphs Abs: 0.7 10*3/uL (ref 0.7–4.0)
MCH: 32.4 pg (ref 26.0–34.0)
MCHC: 30.2 g/dL (ref 30.0–36.0)
MCV: 107.4 fL — ABNORMAL HIGH (ref 80.0–100.0)
Monocytes Absolute: 0.7 10*3/uL (ref 0.1–1.0)
Monocytes Relative: 8 %
Neutro Abs: 6.7 10*3/uL (ref 1.7–7.7)
Neutrophils Relative %: 80 %
Platelets: 170 10*3/uL (ref 150–400)
RBC: 2.16 MIL/uL — ABNORMAL LOW (ref 4.22–5.81)
RDW: 17.3 % — ABNORMAL HIGH (ref 11.5–15.5)
WBC: 8.3 10*3/uL (ref 4.0–10.5)
nRBC: 0 % (ref 0.0–0.2)

## 2019-10-16 LAB — BASIC METABOLIC PANEL
Anion gap: 12 (ref 5–15)
BUN: 64 mg/dL — ABNORMAL HIGH (ref 8–23)
CO2: 24 mmol/L (ref 22–32)
Calcium: 7.4 mg/dL — ABNORMAL LOW (ref 8.9–10.3)
Chloride: 98 mmol/L (ref 98–111)
Creatinine, Ser: 4.96 mg/dL — ABNORMAL HIGH (ref 0.61–1.24)
GFR calc Af Amer: 11 mL/min — ABNORMAL LOW (ref >60)
GFR calc non Af Amer: 10 mL/min — ABNORMAL LOW (ref >60)
Glucose, Bld: 73 mg/dL (ref 70–99)
Potassium: 4.9 mmol/L (ref 3.5–5.1)
Sodium: 134 mmol/L — ABNORMAL LOW (ref 135–145)

## 2019-10-16 LAB — GLUCOSE, CAPILLARY: Glucose-Capillary: 72 mg/dL (ref 70–99)

## 2019-10-16 LAB — HEPATITIS B SURFACE ANTIGEN: Hepatitis B Surface Ag: NONREACTIVE

## 2019-10-16 LAB — PREPARE RBC (CROSSMATCH)

## 2019-10-16 MED ORDER — SODIUM CHLORIDE 0.9% IV SOLUTION: Freq: Once | INTRAVENOUS | Status: AC

## 2019-10-16 MED ORDER — ALTEPLASE 2 MG IJ SOLR: 2.0000 mg | Freq: Once | INTRAMUSCULAR | Status: DC | PRN

## 2019-10-16 MED ORDER — SODIUM CHLORIDE 0.9 % IV SOLN: 100.0000 mL | INTRAVENOUS | Status: DC | PRN

## 2019-10-16 MED ORDER — ALBUMIN HUMAN 25 % IV SOLN
25.0000 g | Freq: Once | INTRAVENOUS | Status: AC
  Administered 2019-10-16: 25 g via INTRAVENOUS

## 2019-10-16 NOTE — Progress Notes (Addendum)
Daily Progress Note   Patient Name: Jesse Murphy       Date: 10/16/2019 DOB: 01/24/1929  Age: 86 y.o. MRN#: [id number] Attending Physician: Aline August, MD Primary Care Physician: Leeanne Rio, MD Admit Date: 10/13/2019  Reason for Consultation/Follow-up: Establishing goals of care  Subjective: Patient asleep. Son at bedside. Patient is reluctant to go to SNF but still desires continued medical care.    Length of Stay: 3  Current Medications: Scheduled Meds:  . sodium chloride   Intravenous Once  . Chlorhexidine Gluconate Cloth  6 each Topical Q0600  . Chlorhexidine Gluconate Cloth  6 each Topical Q0600  . docusate sodium  100 mg Oral BID  . midodrine  10 mg Oral BID WC  . midodrine  5 mg Oral Q M,W,F-HD  . mupirocin ointment  1 application Topical TID  . polyethylene glycol  17 g Oral Daily  . polyvinyl alcohol  1 drop Both Eyes QID  . predniSONE  70 mg Oral Q breakfast  . sodium chloride flush  10-40 mL Intracatheter Q12H  . sulfamethoxazole-trimethoprim  0.5 tablet Oral Once per day on Mon Wed Fri  . triamcinolone cream  1 application Topical BID    Continuous Infusions:   PRN Meds: acetaminophen **OR** acetaminophen, fentaNYL (SUBLIMAZE) injection, nystatin ointment, ondansetron **OR** ondansetron (ZOFRAN) IV, oxyCODONE, sodium chloride flush   Vital Signs: BP 138/88   Pulse 83   Temp 98.6 F (37 C) (Oral)   Resp 14   Ht 5\' 8"  (1.727 m)   Wt 69.1 kg   SpO2 92%   BMI 23.16 kg/m  SpO2: SpO2: 92 % O2 Device: O2 Device: Room Air O2 Flow Rate:    Intake/output summary:   Intake/Output Summary (Last 24 hours) at 10/16/2019 1209 Last data filed at 10/16/2019 0300 Gross per 24 hour  Intake 215 ml  Output 0 ml  Net 215 ml   LBM: Last BM Date:  10/15/19 Baseline Weight: Weight: 70.1 kg Most recent weight: Weight: 69.1 kg       Palliative Assessment/Data: PPS: 40%     Patient Active Problem List   Diagnosis Date Noted  . Advanced care planning/counseling discussion   . End stage renal disease on dialysis ([place])   . Palliative care by specialist   . Goals of care, counseling/discussion   .  Rectal bleeding 10/13/2019  . Inguinal hernia of left side without obstruction or gangrene   . 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 History of atrial fibrillation- treated surgically, no longer has it per the patient 10/01/2019  . Personal history of prostate cancer 10/01/2019  . Stevens-Johnson syndrome ([place]) 10/01/2019  . Abnormality of gait 09/24/2019  . History of pleural effusion 08/22/2019  . Pleural effusion on left 07/11/2019  . Hematuria 07/09/2019  . Postop check 06/27/2019  . Adult failure to thrive   . History of non-Hodgkin's lymphoma   . Chest tube in place   . Failure to thrive (child)   . Prediabetes   . Hypoalbuminemia due to protein-calorie malnutrition ([place])   . Failure to thrive in adult   . Allergic contact dermatitis   . ESRD on dialysis ([place])   . Dysphagia   . Thrombocytopenia ([place])   . Acute on chronic anemia   . Supplemental oxygen dependent   . Acute systolic congestive heart failure ([place])   . Pressure ulcer 05/23/2019  . Debility 05/22/2019  . Palliative care encounter   . Pressure injury of skin 03/25/2019  . Acute respiratory failure with hypoxia ([place])   . S/P CABG x 3 03/16/2019  . Non-ST elevation (NSTEMI) myocardial infarction ([place])   . Acute on chronic combined systolic and diastolic CHF (congestive heart failure) ([place])   . Coronary artery disease due to lipid rich plaque   . Atrial fibrillation with RVR ([place]) 03/14/2019  . Atrial fibrillation with rapid ventricular response ([place]) 03/13/2019  . Chest pain 09/12/2018  .  Chronic diastolic heart failure ([place]) 05/29/2018  . Hypertension 05/29/2018  . Non Hodgkin's lymphoma ([place]) 09/30/2017  . Heme positive stool 08/11/2017  . Chronic anticoagulation 07/05/2016  . CAD S/P percutaneous coronary angioplasty 07/05/2016  . Type 2 diabetes mellitus without complications ([place]) 08/81/1031  . PAF (paroxysmal atrial fibrillation) ([place]) 06/14/2016  . Chest pain in adult 06/14/2016  . Dyspnea on exertion 06/14/2016  . Allergic rhinitis 06/14/2016  . GERD (gastroesophageal reflux disease) 06/14/2016  . BPH (benign prostatic hyperplasia) 06/14/2016    Palliative Care Assessment & Plan   Patient Profile: 86 y.o. 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 Scheduled for sigmoidoscopy today. Palliative medicine consulted for [place].   Assessment/Recommendations/Plan   Continue current plan of care  Patient's son appreciates all providers encouraging patient to go to SNF  Recommend Palliative to follow at SNF  Goals of Care and Additional Recommendations:  Limitations on Scope of Treatment: Full Scope Treatment  Code Status:  DNR  Prognosis:   Unable to determine  Discharge Planning:  [place] for rehab with Palliative care service follow-up  Care plan was discussed with patient's son.  Thank you for allowing the Palliative Medicine Team to assist in the care of this patient.   Time In: 1145 Time Out: 1212 Total Time 27 mins Prolonged Time Billed no      Greater than 50%  of this time was spent counseling and coordinating care related to the above assessment and plan.  Mariana Kaufman, AGNP-C Palliative Medicine   Please contact Palliative Medicine Team phone at [phone] for questions and concerns.

## 2019-10-16 NOTE — Progress Notes (Signed)
Patient ID: Jesse Murphy, male   DOB: [birth year], 86 y.o.   MRN: [id number] S: Feels "great" this morning.  O:BP (!) 115/47 (BP Location: Left Arm)   Pulse 88   Temp 98.5 F (36.9 C) (Oral)   Resp 15   Ht 5\' 8"  (1.727 m)   Wt 69.1 kg   SpO2 92%   BMI 23.16 kg/m   Intake/Output Summary (Last 24 hours) at 10/16/2019 8101 Last data filed at 10/16/2019 0300 Gross per 24 hour  Intake 215 ml  Output 0 ml  Net 215 ml   Intake/Output: I/O last 3 completed shifts: In: 215 [I.V.:215] Out: 0   Intake/Output this shift:  No intake/output data recorded. Weight change:  Gen: NAD, chronically ill-appearing CVS:RRR Resp: cta Abd: +BS, soft, NT/ND Ext: 2+ edema BLE, RUE AVF +T/B  Recent Labs  Lab 10/13/19 1015 10/14/19 0746 10/15/19 0617 10/16/19 0601  NA 134* 135 133* 134*  K 4.4 4.5 5.1 4.9  CL 96* 95* 97* 98  CO2 26 25 25 24   GLUCOSE 150* 84 83 73  BUN 68* 41* 55* 64*  CREATININE 4.05* 3.04* 4.16* 4.96*  ALBUMIN 2.2* 2.2*  --   --   CALCIUM 8.0* 8.0* 7.6* 7.4*  PHOS  --   --  5.5*  --   AST 21 20  --   --   ALT 48* 39  --   --    Liver Function Tests: Recent Labs  Lab 10/13/19 1015 10/14/19 0746  AST 21 20  ALT 48* 39  ALKPHOS 58 56  BILITOT 0.6 0.8  PROT 4.7* 4.6*  ALBUMIN 2.2* 2.2*   No results for input(s): LIPASE, AMYLASE in the last 168 hours. No results for input(s): AMMONIA in the last 168 hours. CBC: Recent Labs  Lab 10/13/19 1015 10/13/19 2004 10/14/19 0746 10/14/19 0746 10/14/19 2050 10/15/19 0617 10/16/19 0601  WBC 15.7*  --  14.5*  --   --  9.1 8.3  NEUTROABS 14.2*  --   --   --   --  7.9* 6.7  HGB 9.0*   < > 8.1*   < > 7.6* 7.2* 7.0*  HCT 29.1*   < > 26.4*   < > 24.5* 23.9* 23.2*  MCV 104.3*  --  104.8*  --   --  106.7* 107.4*  PLT 258  --  214  --   --  179 170   < > = values in this interval not displayed.   Cardiac Enzymes: No results for input(s): CKTOTAL, CKMB, CKMBINDEX, TROPONINI in the last 168 hours. CBG: No results for  input(s): GLUCAP in the last 168 hours.  Iron Studies: No results for input(s): IRON, TIBC, TRANSFERRIN, FERRITIN in the last 72 hours. Studies/Results: No results found. . Chlorhexidine Gluconate Cloth  6 each Topical Q0600  . Chlorhexidine Gluconate Cloth  6 each Topical Q0600  . docusate sodium  100 mg Oral BID  . midodrine  10 mg Oral BID WC  . midodrine  5 mg Oral Q M,W,F-HD  . mupirocin ointment  1 application Topical TID  . polyethylene glycol  17 g Oral Daily  . polyvinyl alcohol  1 drop Both Eyes QID  . predniSONE  70 mg Oral Q breakfast  . sodium chloride flush  10-40 mL Intracatheter Q12H  . sulfamethoxazole-trimethoprim  0.5 tablet Oral Once per day on Mon Wed Fri  . triamcinolone cream  1 application Topical BID    BMET    Component  Value Date/Time   NA 134 (L) 10/16/2019 0601   K 4.9 10/16/2019 0601   CL 98 10/16/2019 0601   CO2 24 10/16/2019 0601   GLUCOSE 73 10/16/2019 0601   BUN 64 (H) 10/16/2019 0601   CREATININE 4.96 (H) 10/16/2019 0601   CALCIUM 7.4 (L) 10/16/2019 0601   CALCIUM 7.4 (L) 04/15/2019 0403   GFRNONAA 10 (L) 10/16/2019 0601   GFRAA 11 (L) 10/16/2019 0601   CBC    Component Value Date/Time   WBC 8.3 10/16/2019 0601   RBC 2.16 (L) 10/16/2019 0601   HGB 7.0 (L) 10/16/2019 0601   HCT 23.2 (L) 10/16/2019 0601   PLT 170 10/16/2019 0601   MCV 107.4 (H) 10/16/2019 0601   MCH 32.4 10/16/2019 0601   MCHC 30.2 10/16/2019 0601   RDW 17.3 (H) 10/16/2019 0601   LYMPHSABS 0.7 10/16/2019 0601   MONOABS 0.7 10/16/2019 0601   EOSABS 0.0 10/16/2019 0601   BASOSABS 0.0 10/16/2019 0601   [facility] 69 kg, 4hours. HD Bath2K, 2.25 ca, Dialyzer , Heparin . AccessLIJ TDC Mircera 225 mcg last on 8/26 Iron 50 mg once a week, last dose 8/26.  Assessment/Plan: 1. GI bleed- s/p sigmoidoscopy 8/30 which revealed an ulcer at anal verge.  Colace and miralax per GI 2. ESRD Plan for HD today to keep on schedule. 3. Anemia: transfuse  prn 4. CKD-MBD: cont with meds 5. Nutrition: npo for now 6. Hypertension: on midodrine for low BP 7. CHF- volume overloaded on exam despite being below edw.  UF as able and may require serial HD. 8. Pemphigus vulgaris- on prednisone per [facility].  Donetta Potts, MD [employer] [phone]

## 2019-10-16 NOTE — Progress Notes (Addendum)
    Subjective: No abdominal pain. Denies bleeding. Nausea last night but ate ice cream and felt better.  Tolerating diet. Eager to go home when able.   Objective: Vital signs in last 24 hours: Temp:  [97.4 F (36.3 C)-98.5 F (36.9 C)] 98.5 F (36.9 C) (08/31 0431) Pulse Rate:  [72-88] 88 (08/31 0431) Resp:  [15-19] 15 (08/31 0431) BP: (69-133)/(40-71) 115/47 (08/31 0431) SpO2:  [92 %-96 %] 92 % (08/31 0431) Weight:  [67.9 kg-69.1 kg] 69.1 kg (08/31 0500) Last BM Date: 10/14/19 General:   Alert and oriented, pleasant Head:  Normocephalic and atraumatic. Abdomen:  Bowel sounds present, soft, non-tender, non-distended. No rebound or guarding. Left inguinal hernia Extremities:  2-3+ edema bilateral lower extremities Neurologic:  Alert and  oriented x4 Psych:  Normal mood and affect.  Intake/Output from previous day: 08/30 0701 - 08/31 0700 In: 215 [I.V.:215] Out: 0  Intake/Output this shift: No intake/output data recorded.  Lab Results: Recent Labs    10/14/19 0746 10/14/19 0746 10/14/19 2050 10/15/19 0617 10/16/19 0601  WBC 14.5*  --   --  9.1 8.3  HGB 8.1*   < > 7.6* 7.2* 7.0*  HCT 26.4*   < > 24.5* 23.9* 23.2*  PLT 214  --   --  179 170   < > = values in this interval not displayed.   BMET Recent Labs    10/14/19 0746 10/15/19 0617 10/16/19 0601  NA 135 133* 134*  K 4.5 5.1 4.9  CL 95* 97* 98  CO2 25 25 24   GLUCOSE 84 83 73  BUN 41* 55* 64*  CREATININE 3.04* 4.16* 4.96*  CALCIUM 8.0* 7.6* 7.4*   LFT Recent Labs    10/13/19 1015 10/14/19 0746  PROT 4.7* 4.6*  ALBUMIN 2.2* 2.2*  AST 21 20  ALT 48* 39  ALKPHOS 58 56  BILITOT 0.6 0.8   PT/INR Recent Labs    10/13/19 1015 10/14/19 0746  LABPROT 13.7 14.4  INR 1.1 1.2    Assessment: 86 year old male with multiple comorbidities, admitted with rectal pain and bleeding, chronic constipation, known history of radiation proctitis s/p complete colonoscopy 2019 with hemospray to rectum due to  angiectasias with active bleeding. Flex sig yesterday with extensively ulcerated distal rectal mucosa s/p biopsy. Differentials solitary rectal ulcer syndrome, stercoral component, ischemia, infectious process not excluded.   Anemia: in setting of chronic disease, ESRD on dialysis, Hgb 9 on admission and down to 7 today. Agree with 1 unit PRBCs ordered.   Pemphigus vulgaris: on prednisone, recently inpatient at [facility]. Per their recommendations, he will be having outpatient appt arranged at [place]: Agree with 1 unit PRBCs Biopsy pending Continue colace BID, Miralax daily Will follow peripherally: hopeful discharge in next 24 hours Outpatient follow-up with Dr. Chana Bode, PhD, ANP-BC [facility]     LOS: 3 days    10/16/2019, 8:43 AM

## 2019-10-16 NOTE — TOC Progression Note (Addendum)
Transition of Care [facility]) - Progression Note   Patient Details  Name: Jesse Murphy MRN: [id number] Date of Birth: 08-Apr-1928  Transition of Care [facility]) CM/SW [place], LCSW Phone Number: 10/16/2019, 3:16 PM  Clinical Narrative: Patient not medically ready to discharge to SNF. CSW spoke with patient's son, Jesse Murphy, regarding SNF. Per son, patient is now agreeable to SNF. CSW provided son with bed offers. Family has selected [facility]. CSW also discussed palliative's recommendation for OP palliative at SNF. Family agreeable to palliative referral. CSW confirmed bed selection with Debbie at [facility] and was informed [facility] usually provides OP palliative at the facility. Son to bring copy of COVID vaccine card tomorrow. AC notified of need for COVID test. TOC to follow.  Expected Discharge Plan: [place] Barriers to Discharge: Continued Medical Work up  Expected Discharge Plan and Services Expected Discharge Plan: [place] Discharge Planning Services: CM Consult Living arrangements for the past 2 months: Single Family Home  Readmission Risk Interventions No flowsheet data found.

## 2019-10-16 NOTE — Progress Notes (Signed)
Patient ID: Jesse Murphy, male   DOB: 1928-08-17, 86 y.o.   MRN: [id number]  PROGRESS NOTE    Jesse Murphy  JAS:[id number] DOB: 30-Apr-1928 DOA: 10/13/2019 PCP: Leeanne Rio, MD   Brief Narrative:  86 y.o. male with medical history significant of end-stage renal disease on hemodialysis, paroxysmal A. fib, CAD/MI/ischemic cardiomyopathy/history of CABG in January 2021, history of tracheostomy temporarily, chronic systolic and diastolic heart failure, complete heart block requiring temporary pacemaker, prostate cancer, recent diagnosis of pemphigus vulgaris  causing diffuse skin peeling/rash for which he was initially hospitalized at [facility] on 10/01/2019 but was subsequently transferred to [facility] where he received a diagnosis and was discharged on oral prednisone presented on 10/13/2019 with rectal bleeding.    Hemoglobin 9 on presentation. CT of abdomen showed no acute findings identified within the abdomen or pelvis; large left inguinal hernia containing a nonobstructed loop of sigmoid colon; large right hydrocele.  GI/general surgery/nephrology were consulted.  Assessment & Plan:   Rectal bleeding Anemia of chronic disease: From renal failure -Most likely lower GI bleeding from diverticulosis/AVM.  Last colonoscopy in 2019 had shown diverticulosis/AVM -Hemoglobin 9 on presentation.  Not much below his baseline.  Hemoglobin 7 today.  Transfuse 1 unit packed cells today.  Monitor H&H. -GI following: Status post sigmoidoscopy on 10/15/2019 which showed extensively ulcerated distal rectal mucosa and GI recommends to follow-up on pathology and continue Colace 100 mg twice a day and MiraLAX 17 mg daily to avoid constipation.  Follow further GI recommendations. -Aspirin held.  End-stage renal disease on hemodialysis -Missed his dialysis on 10/05/2019. His dialysis schedule is Tuesday Thursday and Saturday.  Nephrology following.  Dialysis as per nephrology schedule.  Recent diagnosis of?   Pemphigus vulgaris -Currently being treated with oral prednisone and 3 times a week Bactrim as per his recent hospitalization at [facility].  He has a follow-up appointment as an outpatient at [facility] to the patient's son who states that he is on prednisone for 4 weeks.  Currently on prednisone 70 mg daily which will be continued.  Leukocytosis -Most likely from recent steroid use.    Resolved  Large left inguinal hernia with nonobstructed loop of sigmoid colon -Seen on CAT scan.  Patient mildly tender in the inguinal region.    General surgery evaluation appreciated: No need for any surgical intervention at this time  Hypotension -History of chronic hypotension.  Continue midodrine -Monitor blood pressure  History of CAD/chronic ischemic cardiomyopathy/CABG Chronic combined systolic and diastolic heart failure -Currently stable.  Volume managed by dialysis.    Held aspirin.  Outpatient follow-up with cardiology  Paroxysmal A. Fib -Currently not on anticoagulation.  with history of Maze procedure.  Currently rate controlled.  History of prostate cancer -Treated with seed implants about 10 years ago  Generalized conditioning -PT recommends SNF placement.  Patient is absolutely adamant about going home.  Patient son/Donald is going to speak to his dad today.  -Palliative care evaluation appreciated: Recommend outpatient palliative care follow-up  DVT prophylaxis: SCDs Code Status:  DNR.   Family Communication: Spoke to son on phone on 10/16/2019. Disposition Plan: Status is: Inpatient  Remains inpatient appropriate because:Hemodynamically unstable and Inpatient level of care appropriate due to severity of illness.  Will need packed red cell transfusion today.  Monitor H&H in a.m. prior to discharge   Dispo: The patient is from: Home              Anticipated d/c is  to: Home versus SNF              Anticipated d/c date is: 1 day              Patient currently is  not medically stable to d/c.   Consultants: GI/general surgery/nephrology/palliative care  Procedures:  Sigmoidoscopy Impression:               -Extensively ulcerated distal rectal mucosa as                            described?"status post biopsy. Inflammation abuts                            the anal verge. In this clinical setting, need to                            consider solitary rectal ulcer syndrome although a                            stercoral component is not excluded. Moreover, it                            is not clear whether or not his ongoing severe                            desquamating skin disease is a contributing factor.                            Less likely in the differential would be ischemia                            or an infectious process.                           Ulceration not consistent with radiation proctitis. Moderate Sedation:      Moderate (conscious) sedation was personally administered by an       anesthesia professional. The following parameters were monitored: oxygen       saturation, heart rate, blood pressure, respiratory rate, EKG, adequacy       of pulmonary ventilation, and response to care. Recommendation:           - Return patient to hospital ward for ongoing care.                            Follow-up on pathology. Continue Colace 100 mg                            twice daily and MiraLAX 17 g orally to avoid                            constipation. Patient already receiving high-dose                            oral corticosteroid therapy. Will advance to a soft  diet. Further recommendations to follow.  Antimicrobials: None   Subjective: Patient seen and examined at bedside.  Denies any more rectal bleeding.  Denies abdominal pain.  No overnight fever or vomiting reported.  Asking if he can go home today.   Objective: Vitals:   10/15/19 2130 10/16/19 0431 10/16/19 0500 10/16/19 0958  BP: (!) 109/40  (!) 115/47  138/88  Pulse: 76 88  83  Resp: 17 15  14   Temp: (!) 97.4 F (36.3 C) 98.5 F (36.9 C)  98.6 F (37 C)  TempSrc: Oral Oral  Oral  SpO2: 96% 92%  92%  Weight:   69.1 kg   Height:        Intake/Output Summary (Last 24 hours) at 10/16/2019 1043 Last data filed at 10/16/2019 0300 Gross per 24 hour  Intake 215 ml  Output 0 ml  Net 215 ml   Filed Weights   10/15/19 0706 10/15/19 1417 10/16/19 0500  Weight: 67.9 kg 67.9 kg 69.1 kg    Examination:  General exam: No distress.  Elderly male.  Looks chronically ill Respiratory system: Bilateral decreased breath sounds at bases with scattered crackles Cardiovascular system: S1 & S2 heard, currently rate controlled  gastrointestinal system: Abdomen is nondistended, soft and currently nontender.  Bowel sounds present Genitourinary: Left inguinal hernia present, reducible Extremities: No cyanosis.  Lower extremity edema present bilaterally  Central nervous system: Awake and alert.  Poor historian.  No focal neurological deficits. Moving extremities Skin: Scattered diffuse rash present with some resolution of desquamation Psychiatry: Extremely flat affect   Data Reviewed: I have personally reviewed following labs and imaging studies  CBC: Recent Labs  Lab 10/13/19 1015 10/13/19 1015 10/13/19 2004 10/14/19 0746 10/14/19 2050 10/15/19 0617 10/16/19 0601  WBC 15.7*  --   --  14.5*  --  9.1 8.3  NEUTROABS 14.2*  --   --   --   --  7.9* 6.7  HGB 9.0*   < > 8.6* 8.1* 7.6* 7.2* 7.0*  HCT 29.1*   < > 28.0* 26.4* 24.5* 23.9* 23.2*  MCV 104.3*  --   --  104.8*  --  106.7* 107.4*  PLT 258  --   --  214  --  179 170   < > = values in this interval not displayed.   Basic Metabolic Panel: Recent Labs  Lab 10/13/19 1015 10/14/19 0746 10/15/19 0617 10/16/19 0601  NA 134* 135 133* 134*  K 4.4 4.5 5.1 4.9  CL 96* 95* 97* 98  CO2 26 25 25 24   GLUCOSE 150* 84 83 73  BUN 68* 41* 55* 64*  CREATININE 4.05* 3.04* 4.16* 4.96*   CALCIUM 8.0* 8.0* 7.6* 7.4*  PHOS  --   --  5.5*  --    GFR: Estimated Creatinine Clearance: 10 mL/min (A) (by C-G formula based on SCr of 4.96 mg/dL (H)). Liver Function Tests: Recent Labs  Lab 10/13/19 1015 10/14/19 0746  AST 21 20  ALT 48* 39  ALKPHOS 58 56  BILITOT 0.6 0.8  PROT 4.7* 4.6*  ALBUMIN 2.2* 2.2*   No results for input(s): LIPASE, AMYLASE in the last 168 hours. No results for input(s): AMMONIA in the last 168 hours. Coagulation Profile: Recent Labs  Lab 10/13/19 1015 10/14/19 0746  INR 1.1 1.2   Cardiac Enzymes: No results for input(s): CKTOTAL, CKMB, CKMBINDEX, TROPONINI in the last 168 hours. BNP (last 3 results) No results for input(s): PROBNP in the last 8760 hours. HbA1C: No results  for input(s): HGBA1C in the last 72 hours. CBG: No results for input(s): GLUCAP in the last 168 hours. Lipid Profile: No results for input(s): CHOL, HDL, LDLCALC, TRIG, CHOLHDL, LDLDIRECT in the last 72 hours. Thyroid Function Tests: No results for input(s): TSH, T4TOTAL, FREET4, T3FREE, THYROIDAB in the last 72 hours. Anemia Panel: No results for input(s): VITAMINB12, FOLATE, FERRITIN, TIBC, IRON, RETICCTPCT in the last 72 hours. Sepsis Labs: No results for input(s): PROCALCITON, LATICACIDVEN in the last 168 hours.  Recent Results (from the past 240 hour(s))  SARS Coronavirus 2 by RT PCR (hospital order, performed in [facility] hospital lab) Nasopharyngeal Nasopharyngeal Swab     Status: None   Collection Time: 10/13/19 10:19 AM   Specimen: Nasopharyngeal Swab  Result Value Ref Range Status   SARS Coronavirus 2 NEGATIVE NEGATIVE Final    Comment: (NOTE) SARS-CoV-2 target nucleic acids are NOT DETECTED.  The SARS-CoV-2 RNA is generally detectable in upper and lower respiratory specimens during the acute phase of infection. The lowest concentration of SARS-CoV-2 viral copies this assay can detect is 250 copies / mL. A negative result does not preclude SARS-CoV-2  infection and should not be used as the sole basis for treatment or other patient management decisions.  A negative result may occur with improper specimen collection / handling, submission of specimen other than nasopharyngeal swab, presence of viral mutation(s) within the areas targeted by this assay, and inadequate number of viral copies (<250 copies / mL). A negative result must be combined with clinical observations, patient history, and epidemiological information.  Fact Sheet for Patients:   StrictlyIdeas.no  Fact Sheet for Healthcare Providers: BankingDealers.co.za  This test is not yet approved or  cleared by the [place] FDA and has been authorized for detection and/or diagnosis of SARS-CoV-2 by FDA under an Emergency Use Authorization (EUA).  This EUA will remain in effect (meaning this test can be used) for the duration of the COVID-19 declaration under Section 564(b)(1) of the Act, 21 U.S.C. section 360bbb-3(b)(1), unless the authorization is terminated or revoked sooner.  Performed at [facility], [address] [place], [place] [address]          Radiology Studies: No results found.      Scheduled Meds: . sodium chloride   Intravenous Once  . Chlorhexidine Gluconate Cloth  6 each Topical Q0600  . Chlorhexidine Gluconate Cloth  6 each Topical Q0600  . docusate sodium  100 mg Oral BID  . midodrine  10 mg Oral BID WC  . midodrine  5 mg Oral Q M,W,F-HD  . mupirocin ointment  1 application Topical TID  . polyethylene glycol  17 g Oral Daily  . polyvinyl alcohol  1 drop Both Eyes QID  . predniSONE  70 mg Oral Q breakfast  . sodium chloride flush  10-40 mL Intracatheter Q12H  . sulfamethoxazole-trimethoprim  0.5 tablet Oral Once per day on Mon Wed Fri  . triamcinolone cream  1 application Topical BID   Continuous Infusions:        Aline August, MD [facility] 10/16/2019, 10:43 AM

## 2019-10-16 NOTE — Progress Notes (Signed)
PT Cancellation Note  Patient Details Name: Jesse Murphy MRN: [id number] DOB: 06-19-1928   Cancelled Treatment:    Reason Eval/Treat Not Completed: Patient at procedure or test/unavailable.  Patient unable to be seen secondary to receiving dialysis.  Will check back tomorrow.   2:40 PM, 10/16/19 Lonell Grandchild, MPT Physical Therapist with [facility] [phone] office [phone] mobile phone

## 2019-10-16 NOTE — Plan of Care (Signed)
  Problem: Education: Goal: Knowledge of General Education information will improve Description: Including pain rating scale, medication(s)/side effects and non-pharmacologic comfort measures 10/16/2019 1344 by Madie Reno, RN Outcome: Progressing 10/16/2019 1056 by Madie Reno, RN Outcome: Progressing   Problem: Nutrition: Goal: Adequate nutrition will be maintained 10/16/2019 1344 by Madie Reno, RN Outcome: Progressing 10/16/2019 1056 by Madie Reno, RN Outcome: Progressing   Problem: Safety: Goal: Ability to remain free from injury will improve Outcome: Progressing   Problem: Skin Integrity: Goal: Risk for impaired skin integrity will decrease Outcome: Progressing

## 2019-10-16 NOTE — Procedures (Signed)
    HEMODIALYSIS TREATMENT NOTE:  UF limited by hypotension despite Albumin + Midodrine and cooling of dialysate.  Additionally, volume was introduced with frequent NS flushes (heparin intolerance) and pRBC.  Midodrine 10mg  given pre- and mid-treatment.  Albumin 25g was given twice.  4 hour session nearly completed.  Circuit began clotting with 21m RTD and blood was returned at that point. Average Qb 325 despite alteplase.  Cath exit site unremarkable.  AVF is poorly developed with weak thrill over anastomosis.  "I'm supposed to go see the surgeon and have it checked out."  One unit pRBC transfused without problems.  Treatment balance: NS  + 800cc pRBC  + 315cc Albumin + 200cc UF removed -3901cc _____________________  NET UF 2586 cc  As we do not stock sodium citrate at [facility], catheter was flushed/locked with NS.  Will instill alteplase again prior to next treatment.  No changes from pre-HD assessment.  Rockwell [place], RN

## 2019-10-17 ENCOUNTER — Telehealth: Payer: Self-pay | Admitting: Gastroenterology

## 2019-10-17 DIAGNOSIS — I2510 Atherosclerotic heart disease of native coronary artery without angina pectoris: Secondary | ICD-10-CM

## 2019-10-17 DIAGNOSIS — Z7189 Other specified counseling: Secondary | ICD-10-CM

## 2019-10-17 DIAGNOSIS — Z9861 Coronary angioplasty status: Secondary | ICD-10-CM

## 2019-10-17 DIAGNOSIS — I5032 Chronic diastolic (congestive) heart failure: Secondary | ICD-10-CM

## 2019-10-17 DIAGNOSIS — Z992 Dependence on renal dialysis: Secondary | ICD-10-CM

## 2019-10-17 DIAGNOSIS — N186 End stage renal disease: Secondary | ICD-10-CM

## 2019-10-17 DIAGNOSIS — K4090 Unilateral inguinal hernia, without obstruction or gangrene, not specified as recurrent: Secondary | ICD-10-CM

## 2019-10-17 DIAGNOSIS — E119 Type 2 diabetes mellitus without complications: Secondary | ICD-10-CM

## 2019-10-17 LAB — TYPE AND SCREEN
ABO/RH(D): B POS
Antibody Screen: NEGATIVE
Unit division: 0

## 2019-10-17 LAB — BASIC METABOLIC PANEL
Anion gap: 11 (ref 5–15)
BUN: 30 mg/dL — ABNORMAL HIGH (ref 8–23)
CO2: 27 mmol/L (ref 22–32)
Calcium: 7.6 mg/dL — ABNORMAL LOW (ref 8.9–10.3)
Chloride: 98 mmol/L (ref 98–111)
Creatinine, Ser: 3.26 mg/dL — ABNORMAL HIGH (ref 0.61–1.24)
GFR calc Af Amer: 19 mL/min — ABNORMAL LOW (ref >60)
GFR calc non Af Amer: 16 mL/min — ABNORMAL LOW (ref >60)
Glucose, Bld: 82 mg/dL (ref 70–99)
Potassium: 4 mmol/L (ref 3.5–5.1)
Sodium: 136 mmol/L (ref 135–145)

## 2019-10-17 LAB — CBC WITH DIFFERENTIAL/PLATELET
Abs Immature Granulocytes: 0.12 10*3/uL — ABNORMAL HIGH (ref 0.00–0.07)
Basophils Absolute: 0 10*3/uL (ref 0.0–0.1)
Basophils Relative: 0 %
Eosinophils Absolute: 0.1 10*3/uL (ref 0.0–0.5)
Eosinophils Relative: 1 %
HCT: 25.4 % — ABNORMAL LOW (ref 39.0–52.0)
Hemoglobin: 7.8 g/dL — ABNORMAL LOW (ref 13.0–17.0)
Immature Granulocytes: 2 %
Lymphocytes Relative: 6 %
Lymphs Abs: 0.4 10*3/uL — ABNORMAL LOW (ref 0.7–4.0)
MCH: 31.2 pg (ref 26.0–34.0)
MCHC: 30.7 g/dL (ref 30.0–36.0)
MCV: 101.6 fL — ABNORMAL HIGH (ref 80.0–100.0)
Monocytes Absolute: 0.7 10*3/uL (ref 0.1–1.0)
Monocytes Relative: 10 %
Neutro Abs: 5.6 10*3/uL (ref 1.7–7.7)
Neutrophils Relative %: 81 %
Platelets: 118 10*3/uL — ABNORMAL LOW (ref 150–400)
RBC: 2.5 MIL/uL — ABNORMAL LOW (ref 4.22–5.81)
RDW: 20.6 % — ABNORMAL HIGH (ref 11.5–15.5)
WBC: 7 10*3/uL (ref 4.0–10.5)
nRBC: 0 % (ref 0.0–0.2)

## 2019-10-17 LAB — BPAM RBC
Blood Product Expiration Date: 202110012359
ISSUE DATE / TIME: 202108311629
Unit Type and Rh: 1700

## 2019-10-17 LAB — SURGICAL PATHOLOGY

## 2019-10-17 LAB — SARS CORONAVIRUS 2 BY RT PCR (HOSPITAL ORDER, PERFORMED IN ~~LOC~~ HOSPITAL LAB): SARS Coronavirus 2: NEGATIVE

## 2019-10-17 MED ORDER — DOCUSATE SODIUM 100 MG PO CAPS: 100.0000 mg | ORAL_CAPSULE | Freq: Two times a day (BID) | ORAL | 0 refills | Status: DC

## 2019-10-17 MED ORDER — POLYETHYLENE GLYCOL 3350 17 G PO PACK: 17.0000 g | PACK | Freq: Every day | ORAL | 0 refills | Status: DC

## 2019-10-17 NOTE — Progress Notes (Signed)
Patient ID: Jesse Murphy, male   DOB: 1928/11/03, 86 y.o.   MRN: [id number] S: No new complaints O:BP (!) 108/27 (BP Location: Left Leg)   Pulse 86   Temp 99.8 F (37.7 C)   Resp 18   Ht 5\' 8"  (1.727 m)   Wt 69.2 kg   SpO2 95%   BMI 23.20 kg/m   Intake/Output Summary (Last 24 hours) at 10/17/2019 1012 Last data filed at 10/16/2019 1904 Gross per 24 hour  Intake 315 ml  Output 2586 ml  Net -2271 ml   Intake/Output: I/O last 3 completed shifts: In: 315 [Blood:315] Out: 2586 [Other:2586]  Intake/Output this shift:  No intake/output data recorded. Weight change: 1.3 kg Gen: appears fatigued and weak, no distress CVS:no rub Resp: cta Abd: +BS, soft, NT/ND Ext: 2+ BLE edema, RUE AVF +T/B   Recent Labs  Lab 10/13/19 1015 10/14/19 0746 10/15/19 0617 10/16/19 0601 10/17/19 0558  NA 134* 135 133* 134* 136  K 4.4 4.5 5.1 4.9 4.0  CL 96* 95* 97* 98 98  CO2 26 25 25 24 27   GLUCOSE 150* 84 83 73 82  BUN 68* 41* 55* 64* 30*  CREATININE 4.05* 3.04* 4.16* 4.96* 3.26*  ALBUMIN 2.2* 2.2*  --   --   --   CALCIUM 8.0* 8.0* 7.6* 7.4* 7.6*  PHOS  --   --  5.5*  --   --   AST 21 20  --   --   --   ALT 48* 39  --   --   --    Liver Function Tests: Recent Labs  Lab 10/13/19 1015 10/14/19 0746  AST 21 20  ALT 48* 39  ALKPHOS 58 56  BILITOT 0.6 0.8  PROT 4.7* 4.6*  ALBUMIN 2.2* 2.2*   No results for input(s): LIPASE, AMYLASE in the last 168 hours. No results for input(s): AMMONIA in the last 168 hours. CBC: Recent Labs  Lab 10/13/19 1015 10/13/19 2004 10/14/19 0746 10/14/19 2050 10/15/19 0617 10/16/19 0601 10/17/19 0558  WBC 15.7*  --  14.5*  --  9.1 8.3 7.0  NEUTROABS 14.2*  --   --   --  7.9* 6.7 5.6  HGB 9.0*   < > 8.1*   < > 7.2* 7.0* 7.8*  HCT 29.1*   < > 26.4*   < > 23.9* 23.2* 25.4*  MCV 104.3*  --  104.8*  --  106.7* 107.4* 101.6*  PLT 258  --  214  --  179 170 118*   < > = values in this interval not displayed.   Cardiac Enzymes: No results for input(s):  CKTOTAL, CKMB, CKMBINDEX, TROPONINI in the last 168 hours. CBG: Recent Labs  Lab 10/16/19 2218  GLUCAP 72    Iron Studies: No results for input(s): IRON, TIBC, TRANSFERRIN, FERRITIN in the last 72 hours. Studies/Results: No results found. . Chlorhexidine Gluconate Cloth  6 each Topical Q0600  . Chlorhexidine Gluconate Cloth  6 each Topical Q0600  . docusate sodium  100 mg Oral BID  . midodrine  10 mg Oral BID WC  . midodrine  5 mg Oral Q M,W,F-HD  . mupirocin ointment  1 application Topical TID  . polyethylene glycol  17 g Oral Daily  . polyvinyl alcohol  1 drop Both Eyes QID  . predniSONE  70 mg Oral Q breakfast  . sodium chloride flush  10-40 mL Intracatheter Q12H  . sulfamethoxazole-trimethoprim  0.5 tablet Oral Once per day on Mon Wed  Fri  . triamcinolone cream  1 application Topical BID    BMET    Component Value Date/Time   NA 136 10/17/2019 0558   K 4.0 10/17/2019 0558   CL 98 10/17/2019 0558   CO2 27 10/17/2019 0558   GLUCOSE 82 10/17/2019 0558   BUN 30 (H) 10/17/2019 0558   CREATININE 3.26 (H) 10/17/2019 0558   CALCIUM 7.6 (L) 10/17/2019 0558   CALCIUM 7.4 (L) 04/15/2019 0403   GFRNONAA 16 (L) 10/17/2019 0558   GFRAA 19 (L) 10/17/2019 0558   CBC    Component Value Date/Time   WBC 7.0 10/17/2019 0558   RBC 2.50 (L) 10/17/2019 0558   HGB 7.8 (L) 10/17/2019 0558   HCT 25.4 (L) 10/17/2019 0558   PLT 118 (L) 10/17/2019 0558   MCV 101.6 (H) 10/17/2019 0558   MCH 31.2 10/17/2019 0558   MCHC 30.7 10/17/2019 0558   RDW 20.6 (H) 10/17/2019 0558   LYMPHSABS 0.4 (L) 10/17/2019 0558   MONOABS 0.7 10/17/2019 0558   EOSABS 0.1 10/17/2019 0558   BASOSABS 0.0 10/17/2019 0558   [facility], 4hours. HD Bath2K, 2.25 ca, Dialyzer , Heparin . AccessLIJ TDC Mircera 225 mcg last on 8/26 Iron 50 mg once a week, last dose 8/26.  Assessment/Plan: 1. GI bleed- s/p sigmoidoscopy 8/30 which revealed an ulcer at anal verge.  Colace and  miralax per GI.  Hgb stable 2. ESRDPlan for HD tomorrow to keep on schedule if he remains an inpatient. 3. Anemia:transfuse prn 4. CKD-MBD:cont with meds 5. Nutrition:npo for now 6. Hypotension:on midodrine for low BP 7. CHF- volume overloaded on exam despite being below edw. UF as able and may require serial HD.  Only able to UF 2.6 liters with HD due to hypotension.   8. Pemphigus vulgaris- on prednisone per [facility]. 9. Disposition- awaiting transfer to SNF (needs covid test before he can go). Donetta Potts, MD [employer] [phone]

## 2019-10-17 NOTE — Discharge Summary (Signed)
Physician Discharge Summary  Jesse Murphy DXI:[id number] DOB: 12-25-1928 DOA: 10/13/2019  PCP: Leeanne Rio, MD  Admit date: 10/13/2019 Discharge date: 10/17/2019  Admitted From: home Disposition:  SNF  Recommendations for Outpatient Follow-up:  1. Follow up with PCP in 1-2 weeks 2. Continue HD as scheduled 3. Recommend palliative care follow up at [place]: none Equipment/Devices: none  Discharge Condition: stable CODE STATUS: DNR Diet recommendation: renal diet   HPI: Per admitting MD, Jesse Murphy is a 86 y.o. male with medical history significant of end-stage renal disease on hemodialysis, paroxysmal A. fib, CAD/MI/ischemic cardiomyopathy/history of CABG in January 2021, history of tracheostomy temporarily, chronic, systolic and diastolic heart failure, complete heart block requiring temporary pacemaker, prostate cancer, recent diagnosis of pemphigus vulgaris  causing diffuse skin peeling/rash for which he was initially hospitalized at [facility] on 10/01/2019 but was subsequently transferred to [facility] where he received a diagnosis and was discharged on oral prednisone presented today with rectal bleeding.  Patient is a poor historian but states that he was passing clots this morning.  As per the patient's family on phone, they had noticed some blood few days ago when he was being cleaned.  He presented to the ED today because he was passing clots.  Also complains of lower abdominal and rectal bleeding, states that he feels there is a twisting in his rectal area and the pain comes and goes.  He has had intermittent cramping but has gotten worse today.  He denies any fever, chest pain, vomiting.  He was supposed to have dialysis today but missed it and states that he does not want to have dialysis today.  Denies worsening shortness of breath.  Feels weak.  Complains of nausea.  Hospital Course / Discharge diagnoses: Rectal bleeding Anemia of chronic disease, component  of acute blood loss anemia - GI consulted while hospitalized and patient underwent a sigmoidoscopy on 8/30 which showed extensively ulcerated distal rectal mucosa and GI recommends to follow-up on pathology and continue Colace 100 mg twice a day and MiraLAX 17 mg daily to avoid constipation. Pathology pending at the time of dc, will have follow-up with GI   End-stage renal disease on hemodialysis -continue outpatient HD Recent diagnosis of? Pemphigus vulgaris -Currently being treated with oral prednisone and 3 times a week Bactrim as per his recent hospitalization at [facility]. He has a follow-up appointment as an outpatient at [place] to the patient's son who states that he is on prednisone for 4 weeks. Currently on prednisone 70 mg daily which will be continued.  Leukocytosis -Most likely from recent steroid use. Largeleft inguinal hernia with nonobstructed loop of sigmoid colon -Seen on CAT scan. Patient mildly tender in the inguinal region. General surgery evaluation appreciated: No need for any surgical intervention at this time Hypotension -History of chronic hypotension. Continue midodrine History of CAD/chronic ischemic cardiomyopathy/CABG Chronic combined systolic and diastolic heart failure -Currently stable. Volume managed by dialysis.  Paroxysmal A. Fib -Currently not on anticoagulation. History of Maze procedure. History of prostate cancer -Treated with seed implants about 10 years ago Generalized deconditioning -PT recommends SNF placement. Palliative care evaluation appreciated: Recommend outpatient palliative care follow-up   Discharge Instructions   Allergies as of 10/17/2019      Reactions   Atorvastatin Anaphylaxis, Rash   Leg pain   Midodrine Hcl Rash   Severe sloughing body skin rash 3 days after starting midodrine in Aug 2021   Amiodarone  Rash (see 05/2019 hospital notes), but was on other medications at the time so unclear which agent   Heparin  Other (See Comments)   Thrombocytopenia/ HIT   Reglan [metoclopramide]    Rash (see 05/2019 hospital notes), but was on other medications at the time so unclear which agent      Medication List    STOP taking these medications   mupirocin ointment 2 % Commonly known as: BACTROBAN     TAKE these medications   acetaminophen 500 MG tablet Commonly known as: TYLENOL Take 500 mg by mouth every 6 (six) hours as needed for moderate pain.   aspirin 81 MG chewable tablet Chew 1 tablet by mouth in the morning.   Calcium Carbonate-Vitamin D 600-400 MG-UNIT tablet Take 1 tablet by mouth daily.   carboxymethylcellulose 0.5 % Soln Commonly known as: REFRESH PLUS Place 1 drop into both eyes 4 (four) times daily.   docusate sodium 100 MG capsule Commonly known as: COLACE Take 1 capsule (100 mg total) by mouth 2 (two) times daily.   midodrine 10 MG tablet Commonly known as: PROAMATINE Take 1 tablet by mouth 2 (two) times daily.   midodrine 5 MG tablet Commonly known as: PROAMATINE Take 1 tablet by mouth 3 (three) times a week. Monday, Wednesday, Friday   nystatin ointment Commonly known as: MYCOSTATIN Apply 1 application topically daily as needed for itching. Apply to Penis as needed if itching occurs.   polyethylene glycol 17 g packet Commonly known as: MIRALAX / GLYCOLAX Take 17 g by mouth daily. Start taking on: October 18, 2019   predniSONE 10 MG tablet Commonly known as: DELTASONE Take 7 tablets by mouth daily.   senna 8.6 MG tablet Commonly known as: SENOKOT Take 1 tablet by mouth as needed.   sulfamethoxazole-trimethoprim 400-80 MG tablet Commonly known as: BACTRIM Take 1 tablet by mouth 3 (three) times a week. Monday, Wednesday, Friday   triamcinolone cream 0.1 % Commonly known as: KENALOG Apply 1 application topically 2 (two) times daily. Apply to skin that is intact. DO NOT apply to "burn wounds".       Contact information for after-discharge care     [facility] .   Service: Skilled Nursing Contact information: [address] [place] [phone]                  Consultations:  Palliative  General surgery  GI  Nephrology   Procedures/Studies:  CT ABDOMEN PELVIS WO CONTRAST  Result Date: 10/13/2019 CLINICAL DATA:  GI bleed. Jelly like blood in underwear this morning. Diffuse pain. EXAM: CT ABDOMEN AND PELVIS WITHOUT CONTRAST TECHNIQUE: Multidetector CT imaging of the abdomen and pelvis was performed following the standard protocol without IV contrast. COMPARISON:  04/13/2019 FINDINGS: Lower chest: Bilateral pleural effusions are noted, left greater than right. Hepatobiliary: No focal liver abnormality is seen. No gallstones, gallbladder wall thickening, or biliary dilatation. Pancreas: Unremarkable. No pancreatic ductal dilatation or surrounding inflammatory changes. Spleen: Normal in size without focal abnormality. Adrenals/Urinary Tract: Normal appearance of the adrenal glands. Small bilateral kidney stones are noted. The largest is in the upper pole of left kidney measuring 4 mm. No hydronephrosis identified bilaterally. Small focus of gas is noted within the urinary bladder. No focal bladder abnormality noted. Stomach/Bowel: The stomach appears normal. The appendix is visualized and appears normal. No bowel wall thickening, inflammation, or distension. There is a large left inguinal hernia containing a nonobstructed loop  of sigmoid colon. Moderate hyperdense stool is identified within the colon and rectum. Vascular/Lymphatic: Aortic atherosclerosis. No aneurysm. No abdominopelvic adenopathy. Reproductive: Seed implants are noted within the prostate gland. Large right hydrocele noted, image 85/2. Other: Trace volume of free fluid noted within the abdomen and pelvis. No focal fluid collections identified. Musculoskeletal: Multilevel degenerative disc disease  is noted within the lumbar spine. No acute or suspicious osseous findings. IMPRESSION: 1. No acute findings identified within the abdomen or pelvis. 2. Large left inguinal hernia containing a nonobstructed loop of sigmoid colon. 3. Large right hydrocele. 4. Bilateral pleural effusions, left greater than right. 5. Small focus of gas noted within the urinary bladder. Correlate for any clinical signs or symptoms of cystitis. 6. Aortic atherosclerosis. Aortic Atherosclerosis (ICD10-I70.0). Electronically Signed   By: Kerby Moors M.D.   On: 10/13/2019 12:04   DG Chest Portable 1 View  Result Date: 10/13/2019 CLINICAL DATA:  Weakness.  Rectal bleeding. EXAM: PORTABLE CHEST 1 VIEW COMPARISON:  08/22/2019 FINDINGS: Previous median sternotomy with CABG procedure and left atrial clip placement. There is a left chest wall dialysis catheter with tips at the cavoatrial junction. Normal heart size. Persistent small left pleural effusion identified with pulmonary vascular congestion. No airspace opacities or frank pulmonary edema. Visualized osseous structures are unremarkable. IMPRESSION: Pulmonary vascular congestion and small left pleural effusion. Electronically Signed   By: Kerby Moors M.D.   On: 10/13/2019 11:38     Subjective: - no chest pain, shortness of breath, no abdominal pain, nausea or vomiting.   Discharge Exam: BP (!) 108/27 (BP Location: Left Leg)   Pulse 86   Temp 99.8 F (37.7 C)   Resp 18   Ht 5\' 8"  (1.727 m)   Wt 69.2 kg   SpO2 95%   BMI 23.20 kg/m   General: Pt is alert, awake, not in acute distress Cardiovascular: RRR, S1/S2 +, no rubs, no gallops Respiratory: CTA bilaterally, no wheezing, no rhonchi Abdominal: Soft, NT, ND, bowel sounds + Extremities: no edema, no cyanosis    The results of significant diagnostics from this hospitalization (including imaging, microbiology, ancillary and laboratory) are listed below for reference.     Microbiology: Recent Results  (from the past 240 hour(s))  SARS Coronavirus 2 by RT PCR (hospital order, performed in [facility] hospital lab) Nasopharyngeal Nasopharyngeal Swab     Status: None   Collection Time: 10/13/19 10:19 AM   Specimen: Nasopharyngeal Swab  Result Value Ref Range Status   SARS Coronavirus 2 NEGATIVE NEGATIVE Final    Comment: (NOTE) SARS-CoV-2 target nucleic acids are NOT DETECTED.  The SARS-CoV-2 RNA is generally detectable in upper and lower respiratory specimens during the acute phase of infection. The lowest concentration of SARS-CoV-2 viral copies this assay can detect is 250 copies / mL. A negative result does not preclude SARS-CoV-2 infection and should not be used as the sole basis for treatment or other patient management decisions.  A negative result may occur with improper specimen collection / handling, submission of specimen other than nasopharyngeal swab, presence of viral mutation(s) within the areas targeted by this assay, and inadequate number of viral copies (<250 copies / mL). A negative result must be combined with clinical observations, patient history, and epidemiological information.  Fact Sheet for Patients:   StrictlyIdeas.no  Fact Sheet for Healthcare Providers: BankingDealers.co.za  This test is not yet approved or  cleared by the [place] FDA and has been authorized for detection and/or diagnosis of SARS-CoV-2 by FDA  under an Emergency Use Authorization (EUA).  This EUA will remain in effect (meaning this test can be used) for the duration of the COVID-19 declaration under Section 564(b)(1) of the Act, 21 U.S.C. section 360bbb-3(b)(1), unless the authorization is terminated or revoked sooner.  Performed at [facility], [address], [place], [place] [address]   SARS Coronavirus 2 by RT PCR (hospital order, performed in [facility] hospital lab) Nasopharyngeal Nasopharyngeal Swab     Status: None    Collection Time: 10/17/19  8:09 AM   Specimen: Nasopharyngeal Swab  Result Value Ref Range Status   SARS Coronavirus 2 NEGATIVE NEGATIVE Final    Comment: (NOTE) SARS-CoV-2 target nucleic acids are NOT DETECTED.  The SARS-CoV-2 RNA is generally detectable in upper and lower respiratory specimens during the acute phase of infection. The lowest concentration of SARS-CoV-2 viral copies this assay can detect is 250 copies / mL. A negative result does not preclude SARS-CoV-2 infection and should not be used as the sole basis for treatment or other patient management decisions.  A negative result may occur with improper specimen collection / handling, submission of specimen other than nasopharyngeal swab, presence of viral mutation(s) within the areas targeted by this assay, and inadequate number of viral copies (<250 copies / mL). A negative result must be combined with clinical observations, patient history, and epidemiological information.  Fact Sheet for Patients:   StrictlyIdeas.no  Fact Sheet for Healthcare Providers: BankingDealers.co.za  This test is not yet approved or  cleared by the [place] FDA and has been authorized for detection and/or diagnosis of SARS-CoV-2 by FDA under an Emergency Use Authorization (EUA).  This EUA will remain in effect (meaning this test can be used) for the duration of the COVID-19 declaration under Section 564(b)(1) of the Act, 21 U.S.C. section 360bbb-3(b)(1), unless the authorization is terminated or revoked sooner.  Performed at [facility], [address], [place], [place] [address]      Labs: Basic Metabolic Panel: Recent Labs  Lab 10/13/19 1015 10/14/19 0746 10/15/19 0617 10/16/19 0601 10/17/19 0558  NA 134* 135 133* 134* 136  K 4.4 4.5 5.1 4.9 4.0  CL 96* 95* 97* 98 98  CO2 26 25 25 24 27   GLUCOSE 150* 84 83 73 82  BUN 68* 41* 55* 64* 30*  CREATININE 4.05* 3.04* 4.16* 4.96*  3.26*  CALCIUM 8.0* 8.0* 7.6* 7.4* 7.6*  PHOS  --   --  5.5*  --   --    Liver Function Tests: Recent Labs  Lab 10/13/19 1015 10/14/19 0746  AST 21 20  ALT 48* 39  ALKPHOS 58 56  BILITOT 0.6 0.8  PROT 4.7* 4.6*  ALBUMIN 2.2* 2.2*   CBC: Recent Labs  Lab 10/13/19 1015 10/13/19 2004 10/14/19 0746 10/14/19 2050 10/15/19 0617 10/16/19 0601 10/17/19 0558  WBC 15.7*  --  14.5*  --  9.1 8.3 7.0  NEUTROABS 14.2*  --   --   --  7.9* 6.7 5.6  HGB 9.0*   < > 8.1* 7.6* 7.2* 7.0* 7.8*  HCT 29.1*   < > 26.4* 24.5* 23.9* 23.2* 25.4*  MCV 104.3*  --  104.8*  --  106.7* 107.4* 101.6*  PLT 258  --  214  --  179 170 118*   < > = values in this interval not displayed.   CBG: Recent Labs  Lab 10/16/19 2218  GLUCAP 72   Hgb A1c No results for input(s): HGBA1C in the last 72 hours.  Lipid Profile No results for input(s): CHOL, HDL, LDLCALC, TRIG, CHOLHDL, LDLDIRECT in the last 72 hours. Thyroid function studies No results for input(s): TSH, T4TOTAL, T3FREE, THYROIDAB in the last 72 hours.  Invalid input(s): FREET3 Urinalysis    Component Value Date/Time   COLORURINE AMBER (A) 06/22/2019 1830   APPEARANCEUR CLOUDY (A) 06/22/2019 1830   LABSPEC 1.020 06/22/2019 1830   PHURINE 6.5 06/22/2019 1830   GLUCOSEU 100 (A) 06/22/2019 1830   HGBUR LARGE (A) 06/22/2019 1830   BILIRUBINUR MODERATE (A) 06/22/2019 1830   [place] (A) 06/22/2019 1830   PROTEINUR >300 (A) 06/22/2019 1830   NITRITE POSITIVE (A) 06/22/2019 1830   LEUKOCYTESUR LARGE (A) 06/22/2019 1830    FURTHER DISCHARGE INSTRUCTIONS:   Get Medicines reviewed and adjusted: Please take all your medications with you for your next visit with your Primary MD   Laboratory/radiological data: Please request your Primary MD to go over all hospital tests and procedure/radiological results at the follow up, please ask your Primary MD to get all Hospital records sent to his/her office.   In some cases, they will be blood  work, cultures and biopsy results pending at the time of your discharge. Please request that your primary care M.D. goes through all the records of your hospital data and follows up on these results.   Also Note the following: If you experience worsening of your admission symptoms, develop shortness of breath, life threatening emergency, suicidal or homicidal thoughts you must seek medical attention immediately by calling 911 or calling your MD immediately  if symptoms less severe.   You must read complete instructions/literature along with all the possible adverse reactions/side effects for all the Medicines you take and that have been prescribed to you. Take any new Medicines after you have completely understood and accpet all the possible adverse reactions/side effects.    Do not drive when taking Pain medications or sleeping medications (Benzodaizepines)   Do not take more than prescribed Pain, Sleep and Anxiety Medications. It is not advisable to combine anxiety,sleep and pain medications without talking with your primary care practitioner   Special Instructions: If you have smoked or chewed Tobacco  in the last 2 yrs please stop smoking, stop any regular Alcohol  and or any Recreational drug use.   Wear Seat belts while driving.   Please note: You were cared for by a hospitalist during your hospital stay. Once you are discharged, your primary care physician will handle any further medical issues. Please note that NO REFILLS for any discharge medications will be authorized once you are discharged, as it is imperative that you return to your primary care physician (or establish a relationship with a primary care physician if you do not have one) for your post hospital discharge needs so that they can reassess your need for medications and monitor your lab values.  Time coordinating discharge: 40 minutes  SIGNED:  Marzetta Board, MD, PhD 10/17/2019, 12:53 PM

## 2019-10-17 NOTE — Telephone Encounter (Signed)
Ann, patient needs hospital follow-up with Dr. Laural Golden once discharged. Thanks!

## 2019-10-17 NOTE — Telephone Encounter (Signed)
Forwarded to [employer] to schedule

## 2019-10-17 NOTE — TOC Transition Note (Signed)
Transition of Care [facility]) - CM/SW Discharge Note   Patient Details  Name: RAMZY CAPPELLETTI MRN: [id number] Date of Birth: 1928-02-25  [facility]) CM/SW Contact:  Shade Flood, LCSW Phone Number: 10/17/2019, 2:29 PM   Clinical Narrative:     Pt stable for dc today per MD. Plan remains for transfer to [facility] for rehab. DC clinical sent electronically. RN to call report. EMS arranged. There are no other TOC needs for dc.   Final next level of care: Skilled Nursing Facility Barriers to Discharge: Barriers Resolved   Patient Goals and CMS Choice        Discharge Placement PASRR number recieved: 10/15/19            Patient chooses bed at: Other - please specify in the comment section below: ([facility]) Patient to be transferred to facility by: EMS Name of family member notified: Debbie (dtr) hippa compliant voicemail message Patient and family notified of of transfer: 10/17/19  Discharge Plan and Services   Discharge Planning Services: CM Consult                                 Social Determinants of Health ([place]) Interventions     Readmission Risk Interventions No flowsheet data found.

## 2019-10-17 NOTE — Care Management Important Message (Signed)
Important Message  Patient Details  Name: Jesse Murphy MRN: [id number] Date of Birth: 1928/12/20   Medicare Important Message Given:  Yes     Tommy Medal 10/17/2019, 4:30 PM

## 2019-10-17 NOTE — Progress Notes (Signed)
Report called to [phone] Spoke with  Lerry Paterson, .[facility]. No changes initial am assessment.

## 2019-10-18 DIAGNOSIS — K922 Gastrointestinal hemorrhage, unspecified: Secondary | ICD-10-CM | POA: Insufficient documentation

## 2019-10-24 ENCOUNTER — Other Ambulatory Visit: Payer: Self-pay

## 2019-10-24 ENCOUNTER — Ambulatory Visit (INDEPENDENT_AMBULATORY_CARE_PROVIDER_SITE_OTHER): Payer: Medicare Other | Admitting: Physician Assistant

## 2019-10-24 ENCOUNTER — Ambulatory Visit (HOSPITAL_COMMUNITY)
Admission: RE | Admit: 2019-10-24 | Discharge: 2019-10-24 | Disposition: A | Discharge status: Home / Self Care | Payer: No Typology Code available for payment source | Source: Ambulatory Visit | Attending: Family Medicine | Admitting: Family Medicine

## 2019-10-24 VITALS — BP 177/92 | HR 83 | Temp 97.9°F | Resp 20 | Ht 68.0 in

## 2019-10-24 DIAGNOSIS — N186 End stage renal disease: Secondary | ICD-10-CM | POA: Diagnosis not present

## 2019-10-24 DIAGNOSIS — Z992 Dependence on renal dialysis: Secondary | ICD-10-CM

## 2019-10-24 NOTE — Progress Notes (Signed)
POST OPERATIVE DIALYSIS ACCESS OFFICE NOTE    CC:  F/u for dialysis access surgery  HPI:  This is a 86 y.o. male who is s/p first stage right basilic vein transposition on 09/10/2019.  His son accompanies him today.  He denies hand pain, fever or chills.  Recent diagnosis of pemphigus vulgaris.  Dialysis access: currently right IJ [facility] Dialysis days:  TTS Dialysis center: [facility]  Allergies  Allergen Reactions  . Atorvastatin Anaphylaxis and Rash    Leg pain  . Midodrine Hcl Rash    Severe sloughing body skin rash 3 days after starting midodrine in Aug 2021  . Amiodarone     Rash (see 05/2019 hospital notes), but was on other medications at the time so unclear which agent   . Heparin Other (See Comments)    Thrombocytopenia/ HIT  . Reglan [Metoclopramide]     Rash (see 05/2019 hospital notes), but was on other medications at the time so unclear which agent    Current Outpatient Medications  Medication Sig Dispense Refill  . acetaminophen (TYLENOL) 500 MG tablet Take 500 mg by mouth every 6 (six) hours as needed for moderate pain.    Marland Kitchen aspirin 81 MG chewable tablet Chew 1 tablet by mouth in the morning.    . Calcium Carbonate-Vitamin D 600-400 MG-UNIT tablet Take 1 tablet by mouth daily.    . carboxymethylcellulose (REFRESH PLUS) 0.5 % SOLN Place 1 drop into both eyes 4 (four) times daily.    Marland Kitchen docusate sodium (COLACE) 100 MG capsule Take 1 capsule (100 mg total) by mouth 2 (two) times daily. 10 capsule 0  . midodrine (PROAMATINE) 10 MG tablet Take 1 tablet by mouth 2 (two) times daily.    . midodrine (PROAMATINE) 5 MG tablet Take 1 tablet by mouth 3 (three) times a week. Monday, Wednesday, Friday    . nystatin ointment (MYCOSTATIN) Apply 1 application topically daily as needed for itching. Apply to Penis as needed if itching occurs.    . polyethylene glycol (MIRALAX / GLYCOLAX) 17 g packet Take 17 g by mouth daily. 14 each 0  . predniSONE (DELTASONE) 10 MG tablet  Take 7 tablets by mouth daily.    Marland Kitchen senna (SENOKOT) 8.6 MG tablet Take 1 tablet by mouth as needed.    . sulfamethoxazole-trimethoprim (BACTRIM) 400-80 MG tablet Take 1 tablet by mouth 3 (three) times a week. Monday, Wednesday, Friday    . triamcinolone cream (KENALOG) 0.1 % Apply 1 application topically 2 (two) times daily. Apply to skin that is intact. DO NOT apply to "burn wounds".     No current facility-administered medications for this visit.     ROS:  See HPI  BP (!) 177/92 (BP Location: Right Leg, Patient Position: Sitting, Cuff Size: Normal)   Pulse 83   Temp 97.9 F (36.6 C) (Temporal)   Resp 20   Ht 5\' 8"  (1.727 m)   SpO2 98%   BMI 23.20 kg/m    Physical Exam:  General appearance: Somewhat chronically ill-appearing male who is otherwise well-developed, well-nourished and in no apparent distress Cardiac: Heart rate and rhythm are regular Respiratory: Mild resting dyspnea Incision: Well-healed Extremities: Right hand is warm and well-perfused.  Grip strength is 5/5.  Palpable basilic vein fistula in the upper arm.  Good bruit and thrill.  Dialysis duplex on 10/24/2019 Findings:  +--------------------+----------+-----------------+--------+  AVF         PSV (cm/s)Flow Vol (mL/min)Comments  +--------------------+----------+-----------------+--------+  Native artery inflow  251  1694          +--------------------+----------+-----------------+--------+  AVF Anastomosis     454                 +--------------------+----------+-----------------+--------+     +------------+----------+-------------+----------+--------+  OUTFLOW VEINPSV (cm/s)Diameter (cm)Depth (cm)Describe  +------------+----------+-------------+----------+--------+  Prox UA     315    0.70     1.25        +------------+----------+-------------+----------+--------+  Mid UA     184    0.65     1.34         +------------+----------+-------------+----------+--------+  Dist UA     161    0.62     1.22        +------------+----------+-------------+----------+--------+  AC Fossa    379    0.55     0.44        +------------+----------+-------------+----------+--------+    Summary:  Patent arteriovenous fistula.   Assessment/Plan:   -pt does not have evidence of steal syndrome -dialysis duplex today reveals fistula to be of adequate diameter  -Plan second stage right basilic vein transposition on nondialysis day.  We reviewed the risks of the procedure that include but are not limited to, bleeding, infection, steal syndrome, nerve injury.  Barbie Banner, PA-C 10/24/2019 2:33 PM [facility] [phone]  Clinic MD:  Scot Dock

## 2019-10-24 NOTE — H&P (View-Only) (Signed)
POST OPERATIVE DIALYSIS ACCESS OFFICE NOTE    CC:  F/u for dialysis access surgery  HPI:  This is a 86 y.o. male who is s/p first stage right basilic vein transposition on 09/10/2019.  His son accompanies him today.  He denies hand pain, fever or chills.  Recent diagnosis of pemphigus vulgaris.  Dialysis access: currently right IJ [facility] Dialysis days:  TTS Dialysis center: [facility]  Allergies  Allergen Reactions  . Atorvastatin Anaphylaxis and Rash    Leg pain  . Midodrine Hcl Rash    Severe sloughing body skin rash 3 days after starting midodrine in Aug 2021  . Amiodarone     Rash (see 05/2019 hospital notes), but was on other medications at the time so unclear which agent   . Heparin Other (See Comments)    Thrombocytopenia/ HIT  . Reglan [Metoclopramide]     Rash (see 05/2019 hospital notes), but was on other medications at the time so unclear which agent    Current Outpatient Medications  Medication Sig Dispense Refill  . acetaminophen (TYLENOL) 500 MG tablet Take 500 mg by mouth every 6 (six) hours as needed for moderate pain.    Marland Kitchen aspirin 81 MG chewable tablet Chew 1 tablet by mouth in the morning.    . Calcium Carbonate-Vitamin D 600-400 MG-UNIT tablet Take 1 tablet by mouth daily.    . carboxymethylcellulose (REFRESH PLUS) 0.5 % SOLN Place 1 drop into both eyes 4 (four) times daily.    Marland Kitchen docusate sodium (COLACE) 100 MG capsule Take 1 capsule (100 mg total) by mouth 2 (two) times daily. 10 capsule 0  . midodrine (PROAMATINE) 10 MG tablet Take 1 tablet by mouth 2 (two) times daily.    . midodrine (PROAMATINE) 5 MG tablet Take 1 tablet by mouth 3 (three) times a week. Monday, Wednesday, Friday    . nystatin ointment (MYCOSTATIN) Apply 1 application topically daily as needed for itching. Apply to Penis as needed if itching occurs.    . polyethylene glycol (MIRALAX / GLYCOLAX) 17 g packet Take 17 g by mouth daily. 14 each 0  . predniSONE (DELTASONE) 10 MG tablet  Take 7 tablets by mouth daily.    Marland Kitchen senna (SENOKOT) 8.6 MG tablet Take 1 tablet by mouth as needed.    . sulfamethoxazole-trimethoprim (BACTRIM) 400-80 MG tablet Take 1 tablet by mouth 3 (three) times a week. Monday, Wednesday, Friday    . triamcinolone cream (KENALOG) 0.1 % Apply 1 application topically 2 (two) times daily. Apply to skin that is intact. DO NOT apply to "burn wounds".     No current facility-administered medications for this visit.     ROS:  See HPI  BP (!) 177/92 (BP Location: Right Leg, Patient Position: Sitting, Cuff Size: Normal)   Pulse 83   Temp 97.9 F (36.6 C) (Temporal)   Resp 20   Ht 5\' 8"  (1.727 m)   SpO2 98%   BMI 23.20 kg/m    Physical Exam:  General appearance: Somewhat chronically ill-appearing male who is otherwise well-developed, well-nourished and in no apparent distress Cardiac: Heart rate and rhythm are regular Respiratory: Mild resting dyspnea Incision: Well-healed Extremities: Right hand is warm and well-perfused.  Grip strength is 5/5.  Palpable basilic vein fistula in the upper arm.  Good bruit and thrill.  Dialysis duplex on 10/24/2019 Findings:  +--------------------+----------+-----------------+--------+  AVF         PSV (cm/s)Flow Vol (mL/min)Comments  +--------------------+----------+-----------------+--------+  Native artery inflow  251  1694          +--------------------+----------+-----------------+--------+  AVF Anastomosis     454                 +--------------------+----------+-----------------+--------+     +------------+----------+-------------+----------+--------+  OUTFLOW VEINPSV (cm/s)Diameter (cm)Depth (cm)Describe  +------------+----------+-------------+----------+--------+  Prox UA     315    0.70     1.25        +------------+----------+-------------+----------+--------+  Mid UA     184    0.65     1.34         +------------+----------+-------------+----------+--------+  Dist UA     161    0.62     1.22        +------------+----------+-------------+----------+--------+  AC Fossa    379    0.55     0.44        +------------+----------+-------------+----------+--------+    Summary:  Patent arteriovenous fistula.   Assessment/Plan:   -pt does not have evidence of steal syndrome -dialysis duplex today reveals fistula to be of adequate diameter  -Plan second stage right basilic vein transposition on nondialysis day.  We reviewed the risks of the procedure that include but are not limited to, bleeding, infection, steal syndrome, nerve injury.  Barbie Banner, PA-C 10/24/2019 2:33 PM [facility] [phone]  Clinic MD:  Scot Dock

## 2019-10-25 ENCOUNTER — Encounter: Payer: Self-pay | Admitting: Internal Medicine

## 2019-10-31 ENCOUNTER — Encounter: Payer: TRICARE For Life (TFL) | Admitting: Cardiothoracic Surgery

## 2019-11-05 ENCOUNTER — Encounter (HOSPITAL_COMMUNITY): Payer: Self-pay | Admitting: Vascular Surgery

## 2019-11-05 ENCOUNTER — Telehealth: Payer: Self-pay

## 2019-11-05 ENCOUNTER — Other Ambulatory Visit: Payer: Self-pay

## 2019-11-05 ENCOUNTER — Ambulatory Visit (INDEPENDENT_AMBULATORY_CARE_PROVIDER_SITE_OTHER): Payer: Medicare Other | Admitting: Gastroenterology

## 2019-11-05 NOTE — Telephone Encounter (Signed)
Marnisha from [facility] inquired if ok for pt to have ASA morning of surgery. Advised we do not hold ASA prior to surgery, therefore he can it. Nurse verbalized understanding.

## 2019-11-05 NOTE — Progress Notes (Signed)
Same day workup call  Instructed to arrive 1030 11/06/19  Spoke to Unit mgr at [facility] in [place], [place] 1150 11/05/19   Medications for day of surgery with a sip of water : Colace, Refresh plus eye drops, midodrine, prednisone, Bactrim,   If needed Tylenol  If no instruction for Aspirin call Dr. Scot Dock to find out whether to continue through day of surgery - April, RN mgr at [facility] is calling. Provided number for her.   PCP - Inpatient at [facility] currently seeing Dr. Judi Cong Cardiologist - Dr. Haroldine Laws saw 03/27/19  Chest x-ray - 10/13/19 EKG - 07/06/19 Stress Test - Unsure ECHO - 09/05/19 Cardiac Cath - 03/15/19  Sleep Study - No OSA   DM - No  Aspirin Instructions: Air traffic controller [place], RN calling Dr. Nicole Cella office for this   [place] on 10/17/19 negative Currently resident of [facility] in [place], [place].  Did have exposure with [place] on transport vehicle per April, RN testing negative and had Wynetta Emery and [place] Covid vaccine  Anesthesia review: Yes cardiac history  All instructions explained to the [place] unit Physiological scientist with [employer]

## 2019-11-05 NOTE — Anesthesia Preprocedure Evaluation (Addendum)
Anesthesia Evaluation  Patient identified by MRN, date of birth, ID band Patient awake    Reviewed: Allergy & Precautions, NPO status , Patient's Chart, lab work & pertinent test results  Airway Mallampati: II  TM Distance: >3 FB Neck ROM: Full    Dental  (+) Edentulous Upper, Edentulous Lower   Pulmonary former smoker,     + decreased breath sounds      Cardiovascular hypertension,  Rhythm:Regular Rate:Normal     Neuro/Psych    GI/Hepatic   Endo/Other    Renal/GU      Musculoskeletal   Abdominal   Peds  Hematology   Anesthesia Other Findings   Reproductive/Obstetrics                            Anesthesia Physical Anesthesia Plan  ASA: III  Anesthesia Plan: General   Post-op Pain Management:    Induction: Intravenous  PONV Risk Score and Plan: Ondansetron  Airway Management Planned: LMA  Additional Equipment:   Intra-op Plan:   Post-operative Plan:   Informed Consent: I have reviewed the patients History and Physical, chart, labs and discussed the procedure including the risks, benefits and alternatives for the proposed anesthesia with the patient or authorized representative who has indicated his/her understanding and acceptance.       Plan Discussed with:   Anesthesia Plan Comments: (See PAT note written 11/05/2019 by Myra Gianotti, PA-C. )     Anesthesia Quick Evaluation

## 2019-11-05 NOTE — Progress Notes (Signed)
Anesthesia Chart Review: Jesse Murphy   Case: [id number] Date/Time: 11/06/19 1247   Procedure: RIGHT UPPER EXTREMITY SECOND STAGE BASCILIC VEIN TRANSPOSITION (Right )   Anesthesia type: Monitor Anesthesia Care   Pre-op diagnosis: END STAGE RENAL DISEASE   Location: [facility] OR ROOM 11 / [place] OR   Surgeons: Angelia Mould, MD      DISCUSSION: Patient is an 86 year old male scheduled for the above procedure. S/p first stage right basilic vein transposition 09/10/19.   History includes former smoker (quit 1992), CAD ([place]; NSTEMI, s/p CABG: LIMA-LAD, SVG-OM1, SVG-PDA, LA MAZE Procedure, LAA AtriCure Clip 03/16/19, post-operative course complicated by cardiogenic shock s/p LVAD 03/27/19-04/11/19, Pseudomonas pneumonia with recurrent left pleural effusion, tracheostomy 03/27/19 with decannulation 04/28/19, bradycardia s/p emergency placement transvenous temporary pacemaker lead 05/11/19 but not requiring PPM, progression to ESRD with CRRT started 03/29/19; hospitalized 03/13/19-05/22/19), ischemic cardiomyopathy, chronic combined systolic and diastolic CHF, atrial fibrillation, HTN, dyspnea, B-cell lymphoma (of the scalp, s/p radiation 2019), prostate cancer (s/p radiation 2013; radiation cystitis, s/p cystoscopy/fulguration bladder neck bleeders 04/30/19), anemia, thrombocytopenia, ESRD.  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 Only known new drug was midodrine which had been started on 09/12/19, but he had progression of rash despite stopping it on 09/18/19. Reportedly received [employer]&J COVID-19 vaccine 09/28/19 (while rash still present). He was transferred to [facility] and rash felt most consistent with pemphigus vulgaris  ("biopsy with DIF showing intercellular IgG and C3") which was managed with systemic steroids. Unclear if cause was medications or paraneoplastic. Dermatology felt midodrine could be resumed  for hypotension. Out-patient follow-up with [facility].   - Admission 10/13/19-10/17/19 to [facility] for rectal bleeding. 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 Continue Colace and MiraLAX to avoid constipation recommended with out-patent GI follow-up. Leukocytosis felt likely from steroid use for pemphigus vulgaris. General surgery consulted for large left inguinal hernia, but no surgical intervention recommended at that time. Palliative Care consult 10/15/19 for goals of care. Received PRBC for anemia. CHF was felt stable. Discharged to SNF for rehab therapies.    Last evaluated by CT surgeon Dr. Prescott Gum on 08/22/19 for recurrent left pleural effusion follow-up. Left Pleurx Catheter 04/23/19 and s/p removal on 07/02/19. 08/22/19 CXR with only small left pleural effusion. He was stable but with limited exercise tolerance. Next visit scheduled for 11/14/19.    Last evaluated by cardiology on 08/08/19 by Bernerd Pho, PA-C.  Medication titration limited by hypotension and bradycardia.  Planning to update echo prior to next visit--done 09/05/19 showed showed normalization of his LVEF (25-30% to 55-60%). Next visit scheduled 11/07/19.   Last evaluation by dermatology was on 10/31/19 by Dr. Ronalee Red. Skin lesion had healed with only some "heme crusting lateral to mouth." Prednisone decreased to 60 mg daily, taper by 10 mg every 30 days and started on Nystatin for oral candidiasis. One month follow-up planned.   He is a same-day work-up, so anesthesia team to evaluate on the day of surgery. He will need presurgical COVID-19 test   VS:  BP Readings from Last 3 Encounters:  10/24/19 (!) 177/92  10/17/19 (!) 128/49  10/03/19 (!) 104/42   Pulse Readings from Last 3 Encounters:  10/24/19 83   10/17/19 75  10/03/19 98    PROVIDERS: Catalina Antigua  Y, MD is PCP  Kate Sable, MD is cardiologist Prescott Gum, Collier Salina, MD is CT surgeon Donato Heinz, MD is nephrologist Meda Klinefelter, MD is RAD-[facility] CE) Derek Jack, MD is Roselie Awkward, MD is dermatologist [facility] CE) Hildred Laser, MD is GI   LABS: He is for labs on arrival.  As of 10/17/19, WBC 7.0, H/H 7.8/25.4, PLT 118K, glucose 82. S/p 1 unit PRBC 10/16/19.    IMAGES: 1V PCXR 10/13/19: FINDINGS: Previous median sternotomy with CABG procedure and left atrial clip placement. There is a left chest wall dialysis catheter with tips at the cavoatrial junction. Normal heart size. Persistent small left pleural effusion identified with pulmonary vascular congestion. No airspace opacities or frank pulmonary edema. Visualized osseous structures are unremarkable. IMPRESSION: Pulmonary vascular congestion and small left pleural effusion.  CT abd/pelvis 10/13/19: IMPRESSION: 1. No acute findings identified within the abdomen or pelvis. 2. Large left inguinal hernia containing a nonobstructed loop of sigmoid colon. 3. Large right hydrocele. 4. Bilateral pleural effusions, left greater than right. 5. Small focus of gas noted within the urinary bladder. Correlate for any clinical signs or symptoms of cystitis. 6. Aortic atherosclerosis.   EKG: 07/06/2019: Normal sinus rhythm Rightward axis Nonspecific intraventricular block Cannot rule out anterior infarct, age undetermined T wave abnormality, consider inferior ischemia   CV: Echo 09/05/19: IMPRESSIONS  1. Left ventricular ejection fraction, by estimation, is 55 to 60%. The  left ventricle has normal function. Left ventricular endocardial border  not optimally defined to evaluate regional wall motion. Left ventricular  diastolic parameters were normal.  2. Right ventricular systolic function is normal. The right ventricular  size  is normal. There is normal pulmonary artery systolic pressure. The  estimated right ventricular systolic pressure is 03.5 mmHg.  3. The mitral valve is abnormal, mildly thickened and calcified with  moderate annular calcification. Trivial mitral valve regurgitation.  4. The aortic valve is tricuspid. Aortic valve regurgitation is not  visualized. Mild to moderate aortic valve sclerosis/calcification is  present, without any evidence of aortic stenosis.  5. The inferior vena cava is normal in size with greater than 50%  respiratory variability, suggesting right atrial pressure of 3 mmHg.   Carotid US 03/15/19: Summary:  - Right Carotid: Velocities in the right ICA are consistent with a 1-39% stenosis. The extracranial vessels were near-normal with only minimal wall thickening or plaque.  - Left Carotid: Velocities in the left ICA are consistent with a 1-39% stenosis. The extracranial vessels were near-normal with only minimal wall thickening or plaque.  - Vertebrals: Bilateral vertebral arteries demonstrate antegrade flow.  - Subclavians: Normal flow hemodynamics were seen in bilateral subclavian arteries.   Last cardiac cath was 03/15/2019 prior to CABG.   Past Medical History:  Diagnosis Date  . Anemia   . CAD (coronary artery disease)    a. s/p PCI in 1992 b. Coronary CT in 01/2018 showing extensive coronary calcification; FFR indeterminate --> medical management pursued at that time as patient not interested in repeat cath. c. s/p CABGx3 in 02/2019.  . Cancer [facility])    prostate  . Cardiogenic shock ([place])   . CHB (complete heart block) (HCC)    a. due to vagal event in 2021, requiring temp pacer.  . Chronic combined systolic and diastolic CHF (congestive heart failure) ([place])   . Debilitated   . Decubitus ulcer of coccyx, unspecified pressure ulcer stage   . Dyspnea   . End stage renal disease ([place])   . Esophagitis   .  H/O tracheostomy    a. during admit in 2021 for  respiratory failure.  . Hematuria   . Hypertension   . Hypoalbuminemia   . Ischemic cardiomyopathy   . Myocardial infarction ([place])   . PAF (paroxysmal atrial fibrillation) ([place])   . 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 Pleural effusion, left    a. s/p pleurX catheter  . Pressure ulcer of both heels   . Pseudomonas pneumonia ([place])   . Thrombocytopenia ([place])     Past Surgical History:  Procedure Laterality Date  . BASCILIC VEIN TRANSPOSITION Right 09/10/2019   Procedure: RIGHT ARM 1ST STAGE BASCILIC VEIN TRANSPOSITION;  Surgeon: Angelia Mould, MD;  Location: [place];  Service: Vascular;  Laterality: Right;  . BIOPSY  10/15/2019   Procedure: BIOPSY;  Surgeon: Daneil Dolin, MD;  [facility];  Service: Endoscopy;;  Distal Rectal Ulceration biopsies   . CHEST TUBE INSERTION Left 04/23/2019   Procedure: INSERTION PLEURAL DRAINAGE CATHETER TO DRAIN LEFT PLEURAL EFFUSION;  Surgeon: Ivin Poot, MD;  Location: [place];  Service: Thoracic;  Laterality: Left;  . CLIPPING OF ATRIAL APPENDAGE N/A 03/16/2019   Procedure: CLIPPING OF LEFT  ATRIAL APPENDAGE - USING ATRICLIP SIZE 40;  Surgeon: Ivin Poot, MD;  Location: [place];  Service: Open Heart Surgery;  Laterality: N/A;  . COLONOSCOPY N/A 08/25/2017   Procedure: COLONOSCOPY;  Surgeon: Rogene Houston, MD;  [facility];  Service: Endoscopy;  Laterality: N/A;  . CORONARY ARTERY BYPASS GRAFT N/A 03/16/2019   Procedure: CORONARY ARTERY BYPASS GRAFTING (CABG), ON PUMP, TIMES THREE, USING LEFT INTERNAL MAMMARY ARTERY, RIGHT GREATER SAPHENOUS VEIN HARVESTED ENDOSCOPICALLY;  Surgeon: Ivin Poot, MD;  Location: [place];  Service: Open Heart Surgery;  Laterality: N/A;  swan only  . CYSTOSCOPY WITH FULGERATION N/A 04/30/2019   Procedure: CYSTOSCOPY WITH FULGERATION;  Surgeon: Irine Seal, MD;  Location: [place];  Service: Urology;  Laterality: N/A;  . FLEXIBLE SIGMOIDOSCOPY N/A 10/15/2019   Procedure: FLEXIBLE SIGMOIDOSCOPY with  propofol;  Surgeon: Daneil Dolin, MD;  [facility];  Service: Endoscopy;  Laterality: N/A;  . HERNIA REPAIR    . IR FLUORO GUIDE CV LINE LEFT  04/23/2019  . IR FLUORO GUIDE CV LINE RIGHT  05/24/2019  . IR US GUIDE VASC ACCESS LEFT  04/23/2019  . MAZE N/A 03/16/2019   Procedure: MAZE;  Surgeon: Ivin Poot, MD;  Location: [place];  Service: Open Heart Surgery;  Laterality: N/A;  . PLACEMENT OF IMPELLA LEFT VENTRICULAR ASSIST DEVICE Right 03/27/2019   Procedure: Placement Of Impella Left Ventricular Assist Device using ABIOMED Impella 5.5 with SmartAssist Device.;  Surgeon: Wonda Olds, MD;  Location: [facility];  Service: Thoracic;  Laterality: Right;  . PROSTATE SURGERY    . REMOVAL OF IMPELLA LEFT VENTRICULAR ASSIST DEVICE N/A 04/10/2019   Procedure: REMOVAL OF IMPELLA LEFT VENTRICULAR ASSIST DEVICE WITH INSERTION OF RIGHT FEMORAL ARTERIAL LINE;  Surgeon: Ivin Poot, MD;  Location: [place];  Service: Open Heart Surgery;  Laterality: N/A;  . REMOVAL OF PLEURAL DRAINAGE CATHETER Left 07/02/2019   Procedure: REMOVAL OF PLEURAL DRAINAGE CATHETER;  Surgeon: Ivin Poot, MD;  Location: [place];  Service: Thoracic;  Laterality: Left;  . RIGHT/LEFT HEART CATH AND CORONARY ANGIOGRAPHY N/A 03/15/2019   Procedure: RIGHT/LEFT HEART CATH AND CORONARY ANGIOGRAPHY;  Surgeon: Burnell Blanks, MD;  Location: [place] CV LAB;  Service: Cardiovascular;  Laterality: N/A;  . TEE WITHOUT CARDIOVERSION N/A  03/16/2019   Procedure: TRANSESOPHAGEAL ECHOCARDIOGRAM (TEE);  Surgeon: Prescott Gum, Collier Salina, MD;  Location: [place];  Service: Open Heart Surgery;  Laterality: N/A;  . TRACHEOSTOMY TUBE PLACEMENT N/A 03/27/2019   Procedure: TRACHEOSTOMY placed using [place] 8DCT Cuffed.;  Surgeon: Wonda Olds, MD;  [facility];  Service: Thoracic;  Laterality: N/A;  . TRANSURETHRAL RESECTION OF BLADDER NECK N/A 05/13/2019   Procedure: CYSTOSCOPY CLOT EVACUATION FULGRATION CYSTOGRAM AND INSTILLATION OF  FORMALIN;  Surgeon: Lucas Mallow, MD;  Location: [place];  Service: Urology;  Laterality: N/A;    MEDICATIONS: No current facility-administered medications for this encounter.   Marland Kitchen acetaminophen (TYLENOL) 500 MG tablet  . aspirin 81 MG chewable tablet  . b complex-vitamin c-folic acid (NEPHRO-VITE) 0.8 MG TABS tablet  . Calcium Carbonate-Vitamin D 600-400 MG-UNIT tablet  . carboxymethylcellulose (REFRESH PLUS) 0.5 % SOLN  . docusate sodium (COLACE) 100 MG capsule  . Ensure Plus (ENSURE PLUS) LIQD  . famotidine (PEPCID) 20 MG tablet  . midodrine (PROAMATINE) 10 MG tablet  . midodrine (PROAMATINE) 5 MG tablet  . Nutritional Supplements (FEEDING SUPPLEMENT, NEPRO CARB STEADY,) LIQD  . nystatin ointment (MYCOSTATIN)  . polyethylene glycol (MIRALAX / GLYCOLAX) 17 g packet  . PREDNISONE PO  . senna (SENOKOT) 8.6 MG tablet  . sulfamethoxazole-trimethoprim (BACTRIM) 400-80 MG tablet  . triamcinolone cream (KENALOG) 0.1 %    Myra Gianotti, PA-C Surgical Short Stay/Anesthesiology [facility] Phone [phone] [facility] Phone [phone] 11/05/2019 12:07 PM

## 2019-11-06 ENCOUNTER — Other Ambulatory Visit: Payer: Self-pay

## 2019-11-06 ENCOUNTER — Encounter (HOSPITAL_COMMUNITY): Payer: Self-pay | Admitting: Vascular Surgery

## 2019-11-06 ENCOUNTER — Ambulatory Visit (HOSPITAL_COMMUNITY)
Admission: RE | Admit: 2019-11-06 | Discharge: 2019-11-06 | Disposition: A | Discharge status: Home / Self Care | Payer: Medicare Other | Attending: Vascular Surgery | Admitting: Vascular Surgery

## 2019-11-06 ENCOUNTER — Encounter (HOSPITAL_COMMUNITY)
Admission: RE | Disposition: A | Discharge status: Home / Self Care | Payer: Self-pay | Source: Home / Self Care | Attending: Vascular Surgery

## 2019-11-06 ENCOUNTER — Ambulatory Visit (HOSPITAL_COMMUNITY): Payer: Medicare Other | Admitting: Certified Registered Nurse Anesthetist

## 2019-11-06 DIAGNOSIS — N186 End stage renal disease: Secondary | ICD-10-CM | POA: Insufficient documentation

## 2019-11-06 DIAGNOSIS — Z888 Allergy status to other drugs, medicaments and biological substances status: Secondary | ICD-10-CM | POA: Diagnosis not present

## 2019-11-06 DIAGNOSIS — Z79899 Other long term (current) drug therapy: Secondary | ICD-10-CM | POA: Insufficient documentation

## 2019-11-06 DIAGNOSIS — Z20822 Contact with and (suspected) exposure to covid-19: Secondary | ICD-10-CM | POA: Diagnosis not present

## 2019-11-06 DIAGNOSIS — Z992 Dependence on renal dialysis: Secondary | ICD-10-CM | POA: Diagnosis not present

## 2019-11-06 DIAGNOSIS — Z7982 Long term (current) use of aspirin: Secondary | ICD-10-CM | POA: Insufficient documentation

## 2019-11-06 DIAGNOSIS — Z7952 Long term (current) use of systemic steroids: Secondary | ICD-10-CM | POA: Diagnosis not present

## 2019-11-06 DIAGNOSIS — N185 Chronic kidney disease, stage 5: Secondary | ICD-10-CM | POA: Diagnosis not present

## 2019-11-06 HISTORY — DX: Pressure ulcer of right heel, unspecified stage: L89.619

## 2019-11-06 HISTORY — PX: BASCILIC VEIN TRANSPOSITION: SHX5742

## 2019-11-06 HISTORY — DX: Dependence on other enabling machines and devices: Z99.89

## 2019-11-06 HISTORY — DX: Pressure ulcer of sacral region, unspecified stage: L89.159

## 2019-11-06 LAB — POCT I-STAT, CHEM 8
BUN: 22 mg/dL (ref 8–23)
Calcium, Ion: 1.05 mmol/L — ABNORMAL LOW (ref 1.15–1.40)
Chloride: 94 mmol/L — ABNORMAL LOW (ref 98–111)
Creatinine, Ser: 3.1 mg/dL — ABNORMAL HIGH (ref 0.61–1.24)
Glucose, Bld: 102 mg/dL — ABNORMAL HIGH (ref 70–99)
HCT: 27 % — ABNORMAL LOW (ref 39.0–52.0)
Hemoglobin: 9.2 g/dL — ABNORMAL LOW (ref 13.0–17.0)
Potassium: 3 mmol/L — ABNORMAL LOW (ref 3.5–5.1)
Sodium: 136 mmol/L (ref 135–145)
TCO2: 29 mmol/L (ref 22–32)

## 2019-11-06 LAB — SARS CORONAVIRUS 2 BY RT PCR (HOSPITAL ORDER, PERFORMED IN ~~LOC~~ HOSPITAL LAB): SARS Coronavirus 2: NEGATIVE

## 2019-11-06 SURGERY — TRANSPOSITION, VEIN, BASILIC
Anesthesia: Monitor Anesthesia Care | Site: Arm Upper | Laterality: Right

## 2019-11-06 MED ORDER — DEXAMETHASONE SODIUM PHOSPHATE 10 MG/ML IJ SOLN
INTRAMUSCULAR | Status: DC | PRN
  Administered 2019-11-06: 4 mg via INTRAVENOUS

## 2019-11-06 MED ORDER — LIDOCAINE HCL (CARDIAC) PF 100 MG/5ML IV SOSY
PREFILLED_SYRINGE | INTRAVENOUS | Status: DC | PRN
  Administered 2019-11-06: 60 mg via INTRAVENOUS

## 2019-11-06 MED ORDER — CHLORHEXIDINE GLUCONATE 4 % EX LIQD: 60.0000 mL | Freq: Once | CUTANEOUS | Status: DC

## 2019-11-06 MED ORDER — ORAL CARE MOUTH RINSE: 15.0000 mL | Freq: Once | OROMUCOSAL | Status: AC

## 2019-11-06 MED ORDER — SODIUM CHLORIDE 0.9 % IV SOLN: INTRAVENOUS | Status: DC

## 2019-11-06 MED ORDER — CEFAZOLIN SODIUM-DEXTROSE 2-4 GM/100ML-% IV SOLN
2.0000 g | INTRAVENOUS | Status: AC
  Administered 2019-11-06: 2 g via INTRAVENOUS
  Filled 2019-11-06: qty 100

## 2019-11-06 MED ORDER — PHENYLEPHRINE 40 MCG/ML (10ML) SYRINGE FOR IV PUSH (FOR BLOOD PRESSURE SUPPORT)
PREFILLED_SYRINGE | INTRAVENOUS | Status: DC | PRN
  Administered 2019-11-06: 80 ug via INTRAVENOUS
  Administered 2019-11-06: 120 ug via INTRAVENOUS
  Administered 2019-11-06: 80 ug via INTRAVENOUS

## 2019-11-06 MED ORDER — OXYCODONE HCL 5 MG/5ML PO SOLN: 5.0000 mg | Freq: Once | ORAL | Status: DC | PRN

## 2019-11-06 MED ORDER — PHENYLEPHRINE 40 MCG/ML (10ML) SYRINGE FOR IV PUSH (FOR BLOOD PRESSURE SUPPORT)
PREFILLED_SYRINGE | INTRAVENOUS | Status: AC
  Filled 2019-11-06: qty 10

## 2019-11-06 MED ORDER — OXYCODONE-ACETAMINOPHEN 5-325 MG PO TABS: 1.0000 | ORAL_TABLET | ORAL | 0 refills | Status: DC | PRN

## 2019-11-06 MED ORDER — ONDANSETRON HCL 4 MG/2ML IJ SOLN
INTRAMUSCULAR | Status: DC | PRN
  Administered 2019-11-06: 4 mg via INTRAVENOUS

## 2019-11-06 MED ORDER — SODIUM CHLORIDE 0.9 % IV SOLN
0.0500 mg/kg/h | Freq: Once | INTRAVENOUS | Status: AC
  Administered 2019-11-06: 51.9 mg via INTRAVENOUS
  Filled 2019-11-06: qty 250

## 2019-11-06 MED ORDER — CHLORHEXIDINE GLUCONATE 0.12 % MT SOLN
15.0000 mL | Freq: Once | OROMUCOSAL | Status: AC
  Administered 2019-11-06: 15 mL via OROMUCOSAL
  Filled 2019-11-06: qty 15

## 2019-11-06 MED ORDER — FENTANYL CITRATE (PF) 100 MCG/2ML IJ SOLN: 25.0000 ug | INTRAMUSCULAR | Status: DC | PRN

## 2019-11-06 MED ORDER — LACTATED RINGERS IV SOLN: INTRAVENOUS | Status: DC | PRN

## 2019-11-06 MED ORDER — FENTANYL CITRATE (PF) 100 MCG/2ML IJ SOLN
INTRAMUSCULAR | Status: DC | PRN
Start: 2019-11-06 — End: 2019-11-06
  Administered 2019-11-06: 25 ug via INTRAVENOUS
  Administered 2019-11-06: 50 ug via INTRAVENOUS

## 2019-11-06 MED ORDER — SODIUM CHLORIDE (PF) 0.9 % IJ SOLN
INTRAMUSCULAR | Status: DC | PRN
  Administered 2019-11-06: 1000 mL

## 2019-11-06 MED ORDER — SODIUM CHLORIDE 0.9 % IV SOLN
INTRAVENOUS | Status: AC
  Filled 2019-11-06: qty 1.2

## 2019-11-06 MED ORDER — ONDANSETRON HCL 4 MG/2ML IJ SOLN: 4.0000 mg | Freq: Once | INTRAMUSCULAR | Status: DC | PRN

## 2019-11-06 MED ORDER — LIDOCAINE-EPINEPHRINE 1 %-1:100000 IJ SOLN
INTRAMUSCULAR | Status: DC | PRN
  Administered 2019-11-06: 20 mL

## 2019-11-06 MED ORDER — SUCCINYLCHOLINE CHLORIDE 20 MG/ML IJ SOLN
INTRAMUSCULAR | Status: DC | PRN
  Administered 2019-11-06: 120 mg via INTRAVENOUS

## 2019-11-06 MED ORDER — LIDOCAINE-EPINEPHRINE 1 %-1:100000 IJ SOLN
INTRAMUSCULAR | Status: AC
  Filled 2019-11-06: qty 1

## 2019-11-06 MED ORDER — FENTANYL CITRATE (PF) 250 MCG/5ML IJ SOLN
INTRAMUSCULAR | Status: AC
  Filled 2019-11-06: qty 5

## 2019-11-06 MED ORDER — OXYCODONE HCL 5 MG PO TABS: 5.0000 mg | ORAL_TABLET | Freq: Once | ORAL | Status: DC | PRN

## 2019-11-06 MED ORDER — LIDOCAINE HCL (PF) 1 % IJ SOLN
INTRAMUSCULAR | Status: AC
  Filled 2019-11-06: qty 30

## 2019-11-06 MED ORDER — PHENYLEPHRINE HCL-NACL 10-0.9 MG/250ML-% IV SOLN
INTRAVENOUS | Status: DC | PRN
  Administered 2019-11-06: 25 ug/min via INTRAVENOUS

## 2019-11-06 MED ORDER — PROPOFOL 10 MG/ML IV BOLUS
INTRAVENOUS | Status: DC | PRN
  Administered 2019-11-06: 50 mg via INTRAVENOUS
  Administered 2019-11-06: 70 mg via INTRAVENOUS

## 2019-11-06 MED ORDER — 0.9 % SODIUM CHLORIDE (POUR BTL) OPTIME
TOPICAL | Status: DC | PRN
  Administered 2019-11-06: 1000 mL

## 2019-11-06 MED ORDER — PROPOFOL 10 MG/ML IV BOLUS
INTRAVENOUS | Status: AC
  Filled 2019-11-06: qty 20

## 2019-11-06 SURGICAL SUPPLY — 34 items
ARMBAND PINK RESTRICT EXTREMIT (MISCELLANEOUS) ×3 IMPLANT
CANISTER SUCT 3000ML PPV (MISCELLANEOUS) ×3 IMPLANT
CANNULA VESSEL 3MM 2 BLNT TIP (CANNULA) ×3 IMPLANT
CLIP VESOCCLUDE MED 24/CT (CLIP) ×3 IMPLANT
CLIP VESOCCLUDE SM WIDE 24/CT (CLIP) ×3 IMPLANT
COVER PROBE W GEL 5X96 (DRAPES) IMPLANT
COVER WAND RF STERILE (DRAPES) ×3 IMPLANT
DECANTER SPIKE VIAL GLASS SM (MISCELLANEOUS) ×3 IMPLANT
DERMABOND ADVANCED (GAUZE/BANDAGES/DRESSINGS) ×2
DERMABOND ADVANCED .7 DNX12 (GAUZE/BANDAGES/DRESSINGS) ×1 IMPLANT
ELECT REM PT RETURN 9FT ADLT (ELECTROSURGICAL) ×3
ELECTRODE REM PT RTRN 9FT ADLT (ELECTROSURGICAL) ×1 IMPLANT
GAUZE SPONGE 4X4 16PLY XRAY LF (GAUZE/BANDAGES/DRESSINGS) ×3 IMPLANT
GLOVE BIO SURGEON STRL SZ7.5 (GLOVE) ×3 IMPLANT
GLOVE BIOGEL PI IND STRL 8 (GLOVE) ×1 IMPLANT
GLOVE BIOGEL PI INDICATOR 8 (GLOVE) ×2
GOWN STRL REUS W/ TWL LRG LVL3 (GOWN DISPOSABLE) ×3 IMPLANT
GOWN STRL REUS W/TWL LRG LVL3 (GOWN DISPOSABLE) ×6
KIT BASIN OR (CUSTOM PROCEDURE TRAY) ×3 IMPLANT
KIT TURNOVER KIT B (KITS) ×3 IMPLANT
NS IRRIG 1000ML POUR BTL (IV SOLUTION) ×3 IMPLANT
PACK CV ACCESS (CUSTOM PROCEDURE TRAY) ×3 IMPLANT
PAD ARMBOARD 7.5X6 YLW CONV (MISCELLANEOUS) ×6 IMPLANT
SPONGE SURGIFOAM ABS GEL 100 (HEMOSTASIS) IMPLANT
SUT PROLENE 6 0 BV (SUTURE) ×18 IMPLANT
SUT SILK 2 0 SH (SUTURE) IMPLANT
SUT SILK 3 0 (SUTURE) ×2
SUT SILK 3-0 18XBRD TIE 12 (SUTURE) ×1 IMPLANT
SUT VIC AB 3-0 SH 27 (SUTURE) ×4
SUT VIC AB 3-0 SH 27X BRD (SUTURE) ×2 IMPLANT
SUT VICRYL 4-0 PS2 18IN ABS (SUTURE) ×6 IMPLANT
TOWEL GREEN STERILE (TOWEL DISPOSABLE) ×3 IMPLANT
UNDERPAD 30X36 HEAVY ABSORB (UNDERPADS AND DIAPERS) ×3 IMPLANT
WATER STERILE IRR 1000ML POUR (IV SOLUTION) ×3 IMPLANT

## 2019-11-06 NOTE — Interval H&P Note (Signed)
History and Physical Interval Note:  11/06/2019 12:26 PM  Jesse Murphy  has presented today for surgery, with the diagnosis of END STAGE RENAL DISEASE.  The various methods of treatment have been discussed with the patient and family. After consideration of risks, benefits and other options for treatment, the patient has consented to  Procedure(s): RIGHT UPPER EXTREMITY SECOND STAGE [place] (Right) as a surgical intervention.  The patient's history has been reviewed, patient examined, no change in status, stable for surgery.  I have reviewed the patient's chart and labs.  Questions were answered to the patient's satisfaction.     [name]

## 2019-11-06 NOTE — Anesthesia Postprocedure Evaluation (Signed)
Anesthesia Post Note  Patient: Jesse Murphy  Procedure(s) Performed: RIGHT UPPER EXTREMITY SECOND STAGE BASCILIC VEIN TRANSPOSITION (Right Arm Upper)     Patient location during evaluation: PACU Anesthesia Type: MAC Level of consciousness: awake and alert Pain management: pain level controlled Vital Signs Assessment: post-procedure vital signs reviewed and stable Respiratory status: spontaneous breathing, nonlabored ventilation, respiratory function stable and patient connected to nasal cannula oxygen Cardiovascular status: blood pressure returned to baseline and stable Postop Assessment: no apparent nausea or vomiting Anesthetic complications: no   No complications documented.  Last Vitals:  Vitals:   11/06/19 1520 11/06/19 1535  BP: (!) 103/53 (!) 112/45  Pulse: 91 84  Resp: 17 17  Temp:  36.7 C  SpO2: 100% 100%    Last Pain:  Vitals:   11/06/19 1535  TempSrc:   PainSc: 0-No pain                 Wisdom Seybold COKER

## 2019-11-06 NOTE — Transfer of Care (Signed)
Immediate Anesthesia Transfer of Care Note  Patient: Jesse Murphy  Procedure(s) Performed: RIGHT UPPER EXTREMITY SECOND STAGE BASCILIC VEIN TRANSPOSITION (Right Arm Upper)  Patient Location: PACU  Anesthesia Type:General  Level of Consciousness: awake and alert   Airway & Oxygen Therapy: Patient Spontanous Breathing and Patient connected to nasal cannula oxygen  Post-op Assessment: Report given to RN and Post -op Vital signs reviewed and stable  Post vital signs: Reviewed and stable  Last Vitals:  Vitals Value Taken Time  BP 114/57 11/06/19 1504  Temp    Pulse 80 11/06/19 1506  Resp 21 11/06/19 1506  SpO2 100 % 11/06/19 1506  Vitals shown include unvalidated device data.  Last Pain:  Vitals:   11/06/19 1117  TempSrc:   PainSc: 0-No pain      Patients Stated Pain Goal: 4 (84/53/64 6803)  Complications: No complications documented.

## 2019-11-06 NOTE — Anesthesia Procedure Notes (Signed)
Procedure Name: Intubation Date/Time: 11/06/2019 1:22 PM Performed by: Inda Coke, CRNA Pre-anesthesia Checklist: Patient identified, Emergency Drugs available, Suction available and Patient being monitored Patient Re-evaluated:Patient Re-evaluated prior to induction Oxygen Delivery Method: Circle System Utilized Preoxygenation: Pre-oxygenation with 100% oxygen Induction Type: IV induction Ventilation: Mask ventilation without difficulty Laryngoscope Size: Mac and 4 Grade View: Grade I Tube type: Oral Tube size: 7.5 mm Number of attempts: 1 Airway Equipment and Method: Stylet and Oral airway Placement Confirmation: ETT inserted through vocal cords under direct vision,  positive ETCO2 and breath sounds checked- equal and bilateral Secured at: 22 cm Tube secured with: Tape Dental Injury: Teeth and Oropharynx as per pre-operative assessment  Comments: LMA 4.0 inserted with leak and unable to seat. LMA removed and OETT 7.5 placed without difficulty. Grade I view with MAC 4 blade. +BBS, +etco2, VSS

## 2019-11-06 NOTE — Progress Notes (Signed)
Claiborne Billings, CRNA access dialysis port for surgery using NS in Short Stay Room 32.Marland Kitchen

## 2019-11-06 NOTE — Discharge Instructions (Signed)
[facility]  Discharge Instructions  AV Fistula or Graft Surgery for Dialysis Access  Please refer to the following instructions for your post-procedure care. Your surgeon or physician assistant will discuss any changes with you.  Activity  You may drive the day following your surgery, if you are comfortable and no longer taking prescription pain medication. Resume full activity as the soreness in your incision resolves.  Bathing/Showering  You may shower after you go home. Keep your incision dry for 48 hours. Do not soak in a bathtub, hot tub, or swim until the incision heals completely. You may not shower if you have a hemodialysis catheter.  Incision Care  Clean your incision with mild soap and water after 48 hours. Pat the area dry with a clean towel. You do not need a bandage unless otherwise instructed. Do not apply any ointments or creams to your incision. You may have skin glue on your incision. Do not peel it off. It will come off on its own in about one week. Your arm may swell a bit after surgery. To reduce swelling use pillows to elevate your arm so it is above your heart. Your doctor will tell you if you need to lightly wrap your arm with an ACE bandage.  Diet  Resume your normal diet. There are not special food restrictions following this procedure. In order to heal from your surgery, it is CRITICAL to get adequate nutrition. Your body requires vitamins, minerals, and protein. Vegetables are the best source of vitamins and minerals. Vegetables also provide the perfect balance of protein. Processed food has little nutritional value, so try to avoid this.  Medications  Resume taking all of your medications. If your incision is causing pain, you may take over-the counter pain relievers such as acetaminophen (Tylenol). If you were prescribed a stronger pain medication, please be aware these medications can cause nausea and constipation. Prevent  nausea by taking the medication with a snack or meal. Avoid constipation by drinking plenty of fluids and eating foods with high amount of fiber, such as fruits, vegetables, and grains.   Do not take Tylenol if you are taking prescription pain medications.  Follow up Your surgeon may want to see you in the office following your access surgery. If so, this will be arranged at the time of your surgery.  Please call us immediately for any of the following conditions:  . Increased pain, redness, drainage (pus) from your incision site . Fever of 101 degrees or higher . Severe or worsening pain at your incision site . Hand pain or numbness. .  Reduce your risk of vascular disease:  . Stop smoking. If you would like help, call [facility] at 1-800-QUIT-NOW 249-365-5036) or [place] at 3376272547  . Manage your cholesterol . Maintain a desired weight . Control your diabetes . Keep your blood pressure down  Dialysis  It will take several weeks to several months for your new dialysis access to be ready for use. Your surgeon will determine when it is okay to use it. Your nephrologist will continue to direct your dialysis. You can continue to use your Permcath until your new access is ready for use.   11/06/2019 Jesse Murphy [id number] 1928/04/21  Surgeon(s): Angelia Mould, MD  Procedure(s): RIGHT UPPER EXTREMITY SECOND STAGE [place]   May stick graft immediately   May stick graft on designated area only:   X Do not stick right BV fistula  for  4 weeks    If you have any questions, please call the office at [phone].

## 2019-11-06 NOTE — Op Note (Signed)
    NAME: Jesse Murphy    MRN: [id number] DOB: [birth year]    DATE OF OPERATION: 11/06/2019  PREOP DIAGNOSIS:    End-stage renal disease  POSTOP DIAGNOSIS:    Same  PROCEDURE:    Right second stage basilic vein transposition  SURGEON: Judeth Cornfield. Scot Dock, MD  ASSIST: Karoline Caldwell, PA  ANESTHESIA: General  EBL: Minimal  INDICATIONS:    Jesse Murphy is a 86 y.o. male who presents for a second stage basilic vein transposition.  He has a functioning catheter.  FINDINGS:   4 mm basilic vein  TECHNIQUE:   The patient was taken to the operating room and received a general anesthetic.  The right arm was prepped and draped in usual sterile fashion.  Using 2 incisions along the medial aspect of the right upper arm the basilic vein was harvested from just above the antecubital level to the axilla.  Branches were divided between clips and 3-0 silk ties.  Patient had a heparin allergy.  After a tunnel was created between the 2 incisions the patient received Angiomax.  The vein was then divided distally and brought through the tunnel.  The central portion was brought through the previously created tunnel after the vein had been marked prevent twisting.  There was an area of slight narrowing noted which was excised and the vein was spatulated at each end and sewn end to end in a hand clasped fashion using 2 continuous 6-0 Prolene sutures.  At the completion was a good thrill in the fistula.  There was a good ulnar signal with a Doppler.  Hemostasis was obtained in the wounds.  Each of the wounds was closed with 2 deep layers of 3-0 Vicryl and the skin closed with 4-0 Vicryl.  Dermabond was applied.  The patient tolerated the procedure well was transferred to recovery room in stable condition.  All needle and sponge counts were correct.  Given the complexity of the case a first assistant was necessary in order to expedient the procedure and safely perform the technical aspects of the  operation.  [name], MD, FACS [facility]  DATE OF DICTATION:   11/06/2019

## 2019-11-07 ENCOUNTER — Encounter (HOSPITAL_COMMUNITY): Payer: Self-pay | Admitting: Vascular Surgery

## 2019-11-07 ENCOUNTER — Other Ambulatory Visit: Payer: Self-pay

## 2019-11-07 ENCOUNTER — Ambulatory Visit (INDEPENDENT_AMBULATORY_CARE_PROVIDER_SITE_OTHER): Payer: Medicare Other | Admitting: Student

## 2019-11-07 VITALS — BP 126/50 | HR 90 | Ht 68.0 in

## 2019-11-07 DIAGNOSIS — R234 Changes in skin texture: Secondary | ICD-10-CM | POA: Insufficient documentation

## 2019-11-07 DIAGNOSIS — T466X5A Adverse effect of antihyperlipidemic and antiarteriosclerotic drugs, initial encounter: Secondary | ICD-10-CM | POA: Insufficient documentation

## 2019-11-07 DIAGNOSIS — Z992 Dependence on renal dialysis: Secondary | ICD-10-CM

## 2019-11-07 DIAGNOSIS — Z8679 Personal history of other diseases of the circulatory system: Secondary | ICD-10-CM

## 2019-11-07 DIAGNOSIS — E7800 Pure hypercholesterolemia, unspecified: Secondary | ICD-10-CM | POA: Insufficient documentation

## 2019-11-07 DIAGNOSIS — I2510 Atherosclerotic heart disease of native coronary artery without angina pectoris: Secondary | ICD-10-CM | POA: Diagnosis not present

## 2019-11-07 DIAGNOSIS — I953 Hypotension of hemodialysis: Secondary | ICD-10-CM | POA: Diagnosis not present

## 2019-11-07 DIAGNOSIS — N186 End stage renal disease: Secondary | ICD-10-CM

## 2019-11-07 MED ORDER — MIDODRINE HCL 5 MG PO TABS: ORAL_TABLET | ORAL | 6 refills | Status: DC

## 2019-11-07 NOTE — Progress Notes (Signed)
Cardiology Office Note    Date:  11/07/2019   ID:  [name], [name] Dec 18, 1928, MRN [id number]  PCP:  Leeanne Rio, MD  Cardiologist: Kate Sable, MD (Inactive) --> will switch to Dr. Domenic Polite  Chief Complaint  Patient presents with  . Follow-up    3 month visit    History of Present Illness:    Jesse Murphy is a 86 y.o. male with past medical history of CAD (s/p PCI in 1992, CABG in 02/2019 with LIMA to LAD, SVG to OM1, SVG to PDA as well as MAZE and LAA clipping complicated by cardiogenic shock), chronic combined systolic and diastolic CHF (EF 41-32% by echo in 03/2019, 50-55% by TEE in 03/2019), ESRD, symptomatic bradycardia (occurring following CABG in 02/2019), HTN, HLD, and history of prostate cancer who presents to there office today for 28-month follow-up.   He was last examined by myself in 07/2019 and reported overall feeling weak since his prolonged hospitalization earlier in the year. He reported having significant cramps after dialysis but denied any recent chest pain or dyspnea on exertion. He was continued on ASA 81 mg daily as he previously had significant bradycardia with beta-blocker therapy in the past and developed a rash following initiation of statin therapy. He did have a repeat echo in 08/2019 which showed his EF had normalized to 55-60%.   In the interim, he was admitted to 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 He did follow-up with [facility] on 10/31/2019 and Prednisone was decreased to 60 mg daily with instructions to taper by 10 mg every 30 days and he was prescribed Nystatin oral solution for 14 days.  Most recently, he was at [facility] on 11/06/2019 for planned right second stage basilic vein transposition with no immediate complications noted.  In  talking with the patient and his son today, he denies any recent chest pain or dyspnea on exertion. No recent orthopnea or PND. He has experienced worsening lower extremity edema but says his HD sessions were shifted this week due to having to go to [facility] yesterday. Scheduled for HD tomorrow (on T/TH/SAT schedule). He is currently residing at [facility] for rehab. Has been working with PT 1-2 times daily.   Past Medical History:  Diagnosis Date  . Anemia   . CAD (coronary artery disease)    a. s/p PCI in 1992 b. Coronary CT in 01/2018 showing extensive coronary calcification; FFR indeterminate --> medical management pursued at that time as patient not interested in repeat cath. c. s/p CABGx3 in 02/2019.  . Cancer [facility])    prostate  . Cardiogenic shock ([place])   . CHB (complete heart block) (HCC)    a. due to vagal event in 2021, requiring temp pacer.  . Chronic combined systolic and diastolic CHF (congestive heart failure) ([place])   . Debilitated   . Decubitus ulcer of coccyx, unspecified pressure ulcer stage   . Dyspnea   . End stage renal disease ([place])   . Esophagitis   . H/O tracheostomy    a. during admit in 2021 for respiratory failure.  . Hematuria   . Hypertension   . Hypoalbuminemia   . Ischemic cardiomyopathy   . Myocardial infarction ([place])   . PAF (paroxysmal atrial fibrillation) ([place])   . 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  Pleural effusion, left    a. s/p pleurX catheter  . Pressure ulcer of both heels   . Pseudomonas pneumonia ([place])   . Thrombocytopenia ([place])   . Walker as ambulation aid    also uses wheelchair    Past Surgical History:  Procedure Laterality Date  . BASCILIC VEIN TRANSPOSITION Right 09/10/2019   Procedure: RIGHT ARM 1ST STAGE BASCILIC VEIN TRANSPOSITION;  Surgeon: Angelia Mould, MD;  Location: [place];  Service: Vascular;  Laterality: Right;  . BASCILIC VEIN TRANSPOSITION Right 11/06/2019   Procedure: RIGHT UPPER EXTREMITY SECOND STAGE BASCILIC  VEIN TRANSPOSITION;  Surgeon: Angelia Mould, MD;  Location: [place];  Service: Vascular;  Laterality: Right;  . BIOPSY  10/15/2019   Procedure: BIOPSY;  Surgeon: Daneil Dolin, MD;  [facility];  Service: Endoscopy;;  Distal Rectal Ulceration biopsies   . CHEST TUBE INSERTION Left 04/23/2019   Procedure: INSERTION PLEURAL DRAINAGE CATHETER TO DRAIN LEFT PLEURAL EFFUSION;  Surgeon: Ivin Poot, MD;  Location: [place];  Service: Thoracic;  Laterality: Left;  . CLIPPING OF ATRIAL APPENDAGE N/A 03/16/2019   Procedure: CLIPPING OF LEFT  ATRIAL APPENDAGE - USING ATRICLIP SIZE 40;  Surgeon: Ivin Poot, MD;  Location: [place];  Service: Open Heart Surgery;  Laterality: N/A;  . COLONOSCOPY N/A 08/25/2017   Procedure: COLONOSCOPY;  Surgeon: Rogene Houston, MD;  [facility];  Service: Endoscopy;  Laterality: N/A;  . CORONARY ARTERY BYPASS GRAFT N/A 03/16/2019   Procedure: CORONARY ARTERY BYPASS GRAFTING (CABG), ON PUMP, TIMES THREE, USING LEFT INTERNAL MAMMARY ARTERY, RIGHT GREATER SAPHENOUS VEIN HARVESTED ENDOSCOPICALLY;  Surgeon: Ivin Poot, MD;  Location: [place];  Service: Open Heart Surgery;  Laterality: N/A;  swan only  . CYSTOSCOPY WITH FULGERATION N/A 04/30/2019   Procedure: CYSTOSCOPY WITH FULGERATION;  Surgeon: Irine Seal, MD;  Location: [place];  Service: Urology;  Laterality: N/A;  . FLEXIBLE SIGMOIDOSCOPY N/A 10/15/2019   Procedure: FLEXIBLE SIGMOIDOSCOPY with propofol;  Surgeon: Daneil Dolin, MD;  [facility];  Service: Endoscopy;  Laterality: N/A;  . HERNIA REPAIR    . IR FLUORO GUIDE CV LINE LEFT  04/23/2019  . IR FLUORO GUIDE CV LINE RIGHT  05/24/2019  . IR US GUIDE VASC ACCESS LEFT  04/23/2019  . MAZE N/A 03/16/2019   Procedure: MAZE;  Surgeon: Ivin Poot, MD;  Location: [place];  Service: Open Heart Surgery;  Laterality: N/A;  . PLACEMENT OF IMPELLA LEFT VENTRICULAR ASSIST DEVICE Right 03/27/2019   Procedure: Placement Of Impella Left  Ventricular Assist Device using ABIOMED Impella 5.5 with SmartAssist Device.;  Surgeon: Wonda Olds, MD;  [facility];  Service: Thoracic;  Laterality: Right;  . PROSTATE SURGERY    . REMOVAL OF IMPELLA LEFT VENTRICULAR ASSIST DEVICE N/A 04/10/2019   Procedure: REMOVAL OF IMPELLA LEFT VENTRICULAR ASSIST DEVICE WITH INSERTION OF RIGHT FEMORAL ARTERIAL LINE;  Surgeon: Ivin Poot, MD;  Location: [place];  Service: Open Heart Surgery;  Laterality: N/A;  . REMOVAL OF PLEURAL DRAINAGE CATHETER Left 07/02/2019   Procedure: REMOVAL OF PLEURAL DRAINAGE CATHETER;  Surgeon: Ivin Poot, MD;  Location: [place];  Service: Thoracic;  Laterality: Left;  . RIGHT/LEFT HEART CATH AND CORONARY ANGIOGRAPHY N/A 03/15/2019   Procedure: RIGHT/LEFT HEART CATH AND CORONARY ANGIOGRAPHY;  Surgeon: Burnell Blanks, MD;  Location: [place] CV [facility];  Service: Cardiovascular;  Laterality: N/A;  . TEE WITHOUT CARDIOVERSION N/A 03/16/2019   Procedure: TRANSESOPHAGEAL  ECHOCARDIOGRAM (TEE);  Surgeon: Prescott Gum, Collier Salina, MD;  Location: [place];  Service: Open Heart Surgery;  Laterality: N/A;  . TRACHEOSTOMY TUBE PLACEMENT N/A 03/27/2019   Procedure: TRACHEOSTOMY placed using Shiley 8DCT Cuffed.;  Surgeon: Wonda Olds, MD;  [facility];  Service: Thoracic;  Laterality: N/A;  . TRANSURETHRAL RESECTION OF BLADDER NECK N/A 05/13/2019   Procedure: CYSTOSCOPY CLOT EVACUATION FULGRATION CYSTOGRAM AND INSTILLATION OF FORMALIN;  Surgeon: Lucas Mallow, MD;  Location: [place];  Service: Urology;  Laterality: N/A;    Murphy Medications: Outpatient Medications Prior to Visit  Medication Sig Dispense Refill  . acetaminophen (TYLENOL) 500 MG tablet Take 500 mg by mouth every 6 (six) hours as needed for moderate pain.    Marland Kitchen aspirin 81 MG chewable tablet Chew 81 mg by mouth in the morning.     Marland Kitchen b complex-vitamin c-folic acid (NEPHRO-VITE) 0.8 MG TABS tablet Take 1 tablet by mouth daily.    . Calcium  Carbonate-Vitamin D 600-400 MG-UNIT tablet Take 1 tablet by mouth daily.    . carboxymethylcellulose (REFRESH PLUS) 0.5 % SOLN Place 1 drop into both eyes 4 (four) times daily.    Marland Kitchen docusate sodium (COLACE) 100 MG capsule Take 1 capsule (100 mg total) by mouth 2 (two) times daily. 10 capsule 0  . Ensure Plus (ENSURE PLUS) LIQD Take 237 mLs by mouth 2 (two) times daily between meals.    . famotidine (PEPCID) 20 MG tablet Take 20 mg by mouth daily.     . midodrine (PROAMATINE) 10 MG tablet Take 10 mg by mouth 2 (two) times daily.     . Nutritional Supplements (FEEDING SUPPLEMENT, NEPRO CARB STEADY,) LIQD Take 237 mLs by mouth at bedtime.    Marland Kitchen nystatin ointment (MYCOSTATIN) Apply 1 application topically daily as needed for itching. Apply to Penis as needed if itching occurs.    . polyethylene glycol (MIRALAX / GLYCOLAX) 17 g packet Take 17 g by mouth daily. 14 each 0  . PREDNISONE PO Take 70 mg by mouth daily.     Marland Kitchen senna (SENOKOT) 8.6 MG tablet Take 1 tablet by mouth as needed for constipation.     . sulfamethoxazole-trimethoprim (BACTRIM) 400-80 MG tablet Take 1 tablet by mouth 3 (three) times a week. Monday, Wednesday, Friday    . triamcinolone cream (KENALOG) 0.1 % Apply 1 application topically 2 (two) times daily. Apply to skin that is intact. DO NOT apply to "burn wounds".    . midodrine (PROAMATINE) 5 MG tablet Take 5 mg by mouth every Monday, Wednesday, and Friday.    Marland Kitchen oxyCODONE-acetaminophen (PERCOCET) 5-325 MG tablet Take 1 tablet by mouth every 4 (four) hours as needed for severe pain. 20 tablet 0   No facility-administered medications prior to visit.     Allergies:   Atorvastatin, Adhesive [tape], Amiodarone, Reglan [metoclopramide], and Heparin   Social History   Socioeconomic History  . Marital status: Married    Spouse name: Not on file  . Number of children: 2  . Years of education: Not on file  . Highest education level: Not on file  Occupational History  . Occupation:  Librarian, academic in supply    Comment: [place]  Tobacco Use  . Smoking status: Former Smoker    Packs/day: 0.25    Years: 50.00    Pack years: 12.50    Types: Cigarettes    Quit date: 02/15/1990    Years since quitting: 29.7  . Smokeless tobacco: Never Used  Vaping  Use  . Vaping Use: Never used  Substance and Sexual Activity  . Alcohol use: No  . Drug use: No  . Sexual activity: Not Currently  Other Topics Concern  . Not on file  Social History Narrative  . Not on file   Social Determinants of Health   Financial Resource Strain:   . Difficulty of Paying Living Expenses: Not on file  Food Insecurity:   . Worried About Charity fundraiser in the Last Year: Not on file  . Ran Out of Food in the Last Year: Not on file  Transportation Needs:   . Lack of Transportation (Medical): Not on file  . Lack of Transportation (Non-Medical): Not on file  Physical Activity:   . Days of Exercise per Week: Not on file  . Minutes of Exercise per Session: Not on file  Stress:   . Feeling of Stress : Not on file  Social Connections:   . Frequency of Communication with Friends and Family: Not on file  . Frequency of Social Gatherings with Friends and Family: Not on file  . Attends Religious Services: Not on file  . Active Member of Clubs or Organizations: Not on file  . Attends Archivist Meetings: Not on file  . Marital Status: Not on file     Family History:  The patient's family history includes CAD in his brother and sister; CAD (age of onset: 40) in his mother; CAD (age of onset: 3) in his father.   Review of Systems:   Please see the history of present illness.     General:  No chills, fever, night sweats or weight changes. Positive for generalized fatigue.  Cardiovascular:  No chest pain, dyspnea on exertion, orthopnea, palpitations, paroxysmal nocturnal dyspnea. Positive for edema.  Dermatological: Positive for rash (improving).  Respiratory: No cough, dyspnea Urologic:  No hematuria, dysuria Abdominal:   No nausea, vomiting, diarrhea, bright red blood per rectum, melena, or hematemesis Neurologic:  No visual changes, changes in mental status. All other systems reviewed and are otherwise negative except as noted above.   Physical Exam:    VS:  BP (!) 126/50   Pulse 90   Ht 5\' 8"  (1.727 m)   SpO2 94%   BMI 23.20 kg/m    General: Elderly Caucasian male appearing in no acute distress. Sitting in wheelchair.  Head: Normocephalic, atraumatic. Wearing a mask.  Neck: No carotid bruits. JVD not elevated.  Lungs: Respirations regular and unlabored, without wheezes or rales.  Heart: Regular rate and rhythm. No S3 or S4.  No rubs or gallops appreciated. 2/6 SEM along RUSB.  Abdomen: Appears non-distended. No obvious abdominal masses. Msk:  Strength and tone appear normal for age. No obvious joint deformities or effusions. Extremities: No clubbing or cyanosis. 1+ pitting edema bilaterally.  Distal pedal pulses are 2+ bilaterally. Neuro: Alert and oriented X 3. Moves all extremities spontaneously. No focal deficits noted. Psych:  Responds to questions appropriately with a normal affect. Skin: No rashes or lesions noted  Wt Readings from Last 3 Encounters:  11/06/19 152 lb 8.9 oz (69.2 kg)  10/16/19 152 lb 8.9 oz (69.2 kg)  10/02/19 154 lb 12.2 oz (70.2 kg)     Studies/Labs Reviewed:   EKG:  EKG is not ordered today.    Recent Labs: 03/13/2019: TSH 3.683 03/27/2019: B Natriuretic Peptide 800.4 05/22/2019: Magnesium 2.2 10/14/2019: ALT 39 10/17/2019: Platelets 118 11/06/2019: BUN 22; Creatinine, Ser 3.10; Hemoglobin 9.2; Potassium 3.0; Sodium 136  Lipid Panel    Component Value Date/Time   CHOL 117 03/16/2019 0444   TRIG 52 03/16/2019 0444   HDL 33 (L) 03/16/2019 0444   CHOLHDL 3.5 03/16/2019 0444   VLDL 10 03/16/2019 0444   LDLCALC 74 03/16/2019 0444    Additional studies/ records that were reviewed today include:   Cardiac Catheterization:  02/2019  Dist LM to Ost LAD lesion is 80% stenosed.  Ost LM to Dist LM lesion is 50% stenosed.  Mid Cx lesion is 30% stenosed.  Prox RCA-1 lesion is 60% stenosed.  Prox RCA-2 lesion is 99% stenosed.   1. Severe calcific stenosis in the distal left main, ostial LAD and mid RCA 2. Elevated filling pressures  Recommendations: He has a severe, heavily calcified stenosis in the mid RCA. He also has a heavily calcified distal left main, ostial LAD stenosis. Revascularization with PCI would be high risk and require atherectomy in the ostial LAD extending back into the left main with chance of obstruction of flow into the Circumflex. He is 86 years old and would be high risk for CABG as well but he is overall a very functional patient for his age. I will ask CT surgery to see him to discuss CABG. He will need optimization of his heart rate control and diuresis. I will start Lasix 40 mg IV BID.    Echocardiogram: 08/2019 IMPRESSIONS    1. Left ventricular ejection fraction, by estimation, is 55 to 60%. The  left ventricle has normal function. Left ventricular endocardial border  not optimally defined to evaluate regional wall motion. Left ventricular  diastolic parameters were normal.  2. Right ventricular systolic function is normal. The right ventricular  size is normal. There is normal pulmonary artery systolic pressure. The  estimated right ventricular systolic pressure is 97.0 mmHg.  3. The mitral valve is abnormal, mildly thickened and calcified with  moderate annular calcification. Trivial mitral valve regurgitation.  4. The aortic valve is tricuspid. Aortic valve regurgitation is not  visualized. Mild to moderate aortic valve sclerosis/calcification is  present, without any evidence of aortic stenosis.  5. The inferior vena cava is normal in size with greater than 50%  respiratory variability, suggesting right atrial pressure of 3 mmHg.   Assessment:    1. Coronary artery  disease involving native coronary artery of native heart without angina pectoris   2. History of ischemic cardiomyopathy   3. Hemodialysis-associated hypotension   4. ESRD on dialysis [facility])      Plan:   In order of problems listed above:  1. CAD - He is s/p CABG in 02/2019 with LIMA to LAD, SVG to OM1, SVG to PDA as well as MAZE and LAA clipping. He denies any recent chest pain and his breathing has been at baseline.  - Medical therapy has been limited secondary to his multiple medication intolerances. Remains on ASA 81mg  daily. Not on a BB due to prior bradycardia and previously developed a significant rash while on statin therapy. Would not re-challenge at this time given his recent admission for pemphigus vulgaris. LDL was at 74 in 02/2019 which is close to goal.   2. History of Ischemic Cardiomyopathy - EF was previously 25-30% by echo in 03/2019, normalized to 55-60% by repeat echocardiogram in 08/2019. No longer on a BB given prior bradycardia and he has not been on an ACE-I/ARB/ARNI due to CKD.  3. Hypotension - He was started on Midodrine by Nephrology and takes 10mg  BID and an extra 5mg   the mornings prior to HD. Since going to SNF, this has been listed as MWF dosing yet he has dialysis on a Tuesday, Thursday, Saturday schedule. Will adjust med rec with SNF.   4. ESRD - Followed by [facility]. Recently underwent the second stage of basilic vein transposition earlier this week by Dr. Scot Dock.   Medication Adjustments/Labs and Tests Ordered: Murphy medicines are reviewed at length with the patient today.  Concerns regarding medicines are outlined above.  Medication changes, Labs and Tests ordered today are listed in the Patient Instructions below. Patient Instructions  Medication Instructions:  Your physician recommends that you continue on your Murphy medications as directed. Please refer to the Murphy Medication list given to you today.  *If you need a refill on your  cardiac medications before your next appointment, please call your pharmacy*   Lab Work: NONE   If you have labs (blood work) drawn today and your tests are completely normal, you will receive your results only by: Marland Kitchen MyChart Message (if you have MyChart) OR . A paper copy in the mail If you have any lab test that is abnormal or we need to change your treatment, we will call you to review the results.   Testing/Procedures: NONE    Follow-Up: At [facility], you and your health needs are our priority.  As part of our continuing mission to provide you with exceptional heart care, we have created designated Provider Care Teams.  These Care Teams include your primary Cardiologist (physician) and Advanced Practice Providers (APPs -  Physician Assistants and Nurse Practitioners) who all work together to provide you with the care you need, when you need it.  We recommend signing up for the patient portal called "MyChart".  Sign up information is provided on this After Visit Summary.  MyChart is used to connect with patients for Virtual Visits (Telemedicine).  Patients are able to view lab/test results, encounter notes, upcoming appointments, etc.  Non-urgent messages can be sent to your provider as well.   To learn more about what you can do with MyChart, go to [email or web address].    Your next appointment:   6 month(s)  The format for your next appointment:   In Person  Provider:   Rozann Lesches, MD or Bernerd Pho, PA-C   Other Instructions Thank you for choosing [facility] [facility]!       Signed, Erma Heritage, PA-C  11/07/2019 8:02 PM    [facility] [address] [place], [place] [address] Phone: [phone] Fax: [phone]

## 2019-11-07 NOTE — Patient Instructions (Signed)
Medication Instructions:  Your physician recommends that you continue on your current medications as directed. Please refer to the Current Medication list given to you today.  *If you need a refill on your cardiac medications before your next appointment, please call your pharmacy*   Lab Work: NONE   If you have labs (blood work) drawn today and your tests are completely normal, you will receive your results only by: . MyChart Message (if you have MyChart) OR . A paper copy in the mail If you have any lab test that is abnormal or we need to change your treatment, we will call you to review the results.   Testing/Procedures: NONE    Follow-Up: At [facility], you and your health needs are our priority.  As part of our continuing mission to provide you with exceptional heart care, we have created designated Provider Care Teams.  These Care Teams include your primary Cardiologist (physician) and Advanced Practice Providers (APPs -  Physician Assistants and Nurse Practitioners) who all work together to provide you with the care you need, when you need it.  We recommend signing up for the patient portal called "MyChart".  Sign up information is provided on this After Visit Summary.  MyChart is used to connect with patients for Virtual Visits (Telemedicine).  Patients are able to view lab/test results, encounter notes, upcoming appointments, etc.  Non-urgent messages can be sent to your provider as well.   To learn more about what you can do with MyChart, go to [email or web address].    Your next appointment:   6 month(s)  The format for your next appointment:   In Person  Provider:   Samuel McDowell, MD or Brittany Strader, PA-C   Other Instructions Thank you for choosing [facility]!    

## 2019-11-12 ENCOUNTER — Ambulatory Visit (INDEPENDENT_AMBULATORY_CARE_PROVIDER_SITE_OTHER): Payer: Medicare Other | Admitting: Gastroenterology

## 2019-11-12 NOTE — Anesthesia Postprocedure Evaluation (Signed)
Anesthesia Post Note  Patient: Jesse Murphy  Procedure(s) Performed: RIGHT UPPER EXTREMITY SECOND STAGE BASCILIC VEIN TRANSPOSITION (Right Arm Upper)     Patient location during evaluation: PACU Anesthesia Type: MAC Level of consciousness: awake and alert Pain management: pain level controlled Vital Signs Assessment: post-procedure vital signs reviewed and stable Respiratory status: spontaneous breathing, nonlabored ventilation, respiratory function stable and patient connected to nasal cannula oxygen Cardiovascular status: blood pressure returned to baseline and stable Postop Assessment: no apparent nausea or vomiting Anesthetic complications: no   No complications documented.  Last Vitals:  Vitals:   11/06/19 1520 11/06/19 1535  BP: (!) 103/53 (!) 112/45  Pulse: 91 84  Resp: 17 17  Temp:  36.7 C  SpO2: 100% 100%    Last Pain:  Vitals:   11/06/19 1535  TempSrc:   PainSc: 0-No pain                 Asa Baudoin COKER     

## 2019-11-12 NOTE — Addendum Note (Signed)
Addendum  created 11/12/19 1942 by Roberts Gaudy, MD   Clinical Note Signed, Review and Sign - Signed

## 2019-11-13 ENCOUNTER — Other Ambulatory Visit: Payer: Self-pay | Admitting: Cardiothoracic Surgery

## 2019-11-13 DIAGNOSIS — Z8709 Personal history of other diseases of the respiratory system: Secondary | ICD-10-CM

## 2019-11-14 ENCOUNTER — Other Ambulatory Visit (HOSPITAL_COMMUNITY): Payer: Medicare Other

## 2019-11-14 ENCOUNTER — Ambulatory Visit
Admission: RE | Admit: 2019-11-14 | Discharge: 2019-11-14 | Disposition: A | Discharge status: Home / Self Care | Payer: Medicare Other | Source: Ambulatory Visit | Attending: Cardiothoracic Surgery | Admitting: Cardiothoracic Surgery

## 2019-11-14 ENCOUNTER — Ambulatory Visit (INDEPENDENT_AMBULATORY_CARE_PROVIDER_SITE_OTHER): Payer: Medicare Other | Admitting: Cardiothoracic Surgery

## 2019-11-14 ENCOUNTER — Inpatient Hospital Stay (HOSPITAL_COMMUNITY): Admission: RE | Admit: 2019-11-14 | Payer: Medicare Other | Source: Ambulatory Visit

## 2019-11-14 ENCOUNTER — Encounter: Payer: Self-pay | Admitting: Cardiothoracic Surgery

## 2019-11-14 ENCOUNTER — Other Ambulatory Visit: Payer: Self-pay | Admitting: *Deleted

## 2019-11-14 ENCOUNTER — Other Ambulatory Visit: Payer: Self-pay

## 2019-11-14 VITALS — BP 107/65 | HR 83 | Temp 97.5°F | Resp 20 | Ht 68.0 in | Wt 158.0 lb

## 2019-11-14 DIAGNOSIS — J90 Pleural effusion, not elsewhere classified: Secondary | ICD-10-CM

## 2019-11-14 DIAGNOSIS — I2510 Atherosclerotic heart disease of native coronary artery without angina pectoris: Secondary | ICD-10-CM | POA: Diagnosis not present

## 2019-11-14 DIAGNOSIS — Z8709 Personal history of other diseases of the respiratory system: Secondary | ICD-10-CM

## 2019-11-14 NOTE — Progress Notes (Signed)
PCP is Leeanne Rio, MD Referring Provider is Leeanne Rio, MD  Chief Complaint  Patient presents with  . Pleural Effusion    39month f/u left pleural effusion, CXR 11/14/19    HPI: Monthly follow-up with chest x-ray to follow-up postop left pleural effusion.  Patient is status post CABG January 2021 for ischemic cardiomyopathy and non-STEMI.  He is now in a skilled nursing facility in [place], [place].  He is on dialysis Tuesday Thursday Saturday.  He was recently hospitalized for an episode of severe pemphigus and is now on a oral prednisone taper with a good response from the dermatitis.  Associated with his steroids he has had increased ankle swelling and his chest x-ray shows a new moderate left pleural effusion associated with shortness of breath and some cough.  Also on exam he is now back in atrial fibrillation.  Is not tolerant of amiodarone and his blood pressure cannot tolerate beta-blocker.  Past Medical History:  Diagnosis Date  . Anemia   . CAD (coronary artery disease)    a. s/p PCI in 1992 b. Coronary CT in 01/2018 showing extensive coronary calcification; FFR indeterminate --> medical management pursued at that time as patient not interested in repeat cath. c. s/p CABGx3 in 02/2019.  . Cancer [facility])    prostate  . Cardiogenic shock ([place])   . CHB (complete heart block) (HCC)    a. due to vagal event in 2021, requiring temp pacer.  . Chronic combined systolic and diastolic CHF (congestive heart failure) ([place])   . Debilitated   . Decubitus ulcer of coccyx, unspecified pressure ulcer stage   . Dyspnea   . End stage renal disease ([place])   . Esophagitis   . H/O tracheostomy    a. during admit in 2021 for respiratory failure.  . Hematuria   . Hypertension   . Hypoalbuminemia   . Ischemic cardiomyopathy   . Myocardial infarction ([place])   . PAF (paroxysmal atrial fibrillation) ([place])   . 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 Pleural effusion, left    a. s/p pleurX  catheter  . Pressure ulcer of both heels   . Pseudomonas pneumonia ([place])   . Thrombocytopenia ([place])   . Walker as ambulation aid    also uses wheelchair    Past Surgical History:  Procedure Laterality Date  . BASCILIC VEIN TRANSPOSITION Right 09/10/2019   Procedure: RIGHT ARM 1ST STAGE BASCILIC VEIN TRANSPOSITION;  Surgeon: Angelia Mould, MD;  Location: [facility];  Service: Vascular;  Laterality: Right;  . BASCILIC VEIN TRANSPOSITION Right 11/06/2019   Procedure: RIGHT UPPER EXTREMITY SECOND STAGE BASCILIC VEIN TRANSPOSITION;  Surgeon: Angelia Mould, MD;  Location: [facility];  Service: Vascular;  Laterality: Right;  . BIOPSY  10/15/2019   Procedure: BIOPSY;  Surgeon: Daneil Dolin, MD;  [facility];  Service: Endoscopy;;  Distal Rectal Ulceration biopsies   . CHEST TUBE INSERTION Left 04/23/2019   Procedure: INSERTION PLEURAL DRAINAGE CATHETER TO DRAIN LEFT PLEURAL EFFUSION;  Surgeon: Ivin Poot, MD;  Location: [facility];  Service: Thoracic;  Laterality: Left;  . CLIPPING OF ATRIAL APPENDAGE N/A 03/16/2019   Procedure: CLIPPING OF LEFT  ATRIAL APPENDAGE - USING ATRICLIP SIZE 40;  Surgeon: Ivin Poot, MD;  Location: [facility];  Service: Open Heart Surgery;  Laterality: N/A;  . COLONOSCOPY N/A 08/25/2017   Procedure: COLONOSCOPY;  Surgeon: Rogene Houston, MD;  [facility];  Service: Endoscopy;  Laterality: N/A;  .  CORONARY ARTERY BYPASS GRAFT N/A 03/16/2019   Procedure: CORONARY ARTERY BYPASS GRAFTING (CABG), ON PUMP, TIMES THREE, USING LEFT INTERNAL MAMMARY ARTERY, RIGHT GREATER SAPHENOUS VEIN HARVESTED ENDOSCOPICALLY;  Surgeon: Ivin Poot, MD;  Location: [facility];  Service: Open Heart Surgery;  Laterality: N/A;  swan only  . CYSTOSCOPY WITH FULGERATION N/A 04/30/2019   Procedure: CYSTOSCOPY WITH FULGERATION;  Surgeon: Irine Seal, MD;  Location: [facility];  Service: Urology;  Laterality: N/A;  . FLEXIBLE SIGMOIDOSCOPY N/A 10/15/2019   Procedure: FLEXIBLE  SIGMOIDOSCOPY with propofol;  Surgeon: Daneil Dolin, MD;  [facility];  Service: Endoscopy;  Laterality: N/A;  . HERNIA REPAIR    . IR FLUORO GUIDE CV LINE LEFT  04/23/2019  . IR FLUORO GUIDE CV LINE RIGHT  05/24/2019  . IR US GUIDE VASC ACCESS LEFT  04/23/2019  . MAZE N/A 03/16/2019   Procedure: MAZE;  Surgeon: Ivin Poot, MD;  Location: [facility];  Service: Open Heart Surgery;  Laterality: N/A;  . PLACEMENT OF IMPELLA LEFT VENTRICULAR ASSIST DEVICE Right 03/27/2019   Procedure: Placement Of Impella Left Ventricular Assist Device using ABIOMED Impella 5.5 with SmartAssist Device.;  Surgeon: Wonda Olds, MD;  [facility];  Service: Thoracic;  Laterality: Right;  . PROSTATE SURGERY    . REMOVAL OF IMPELLA LEFT VENTRICULAR ASSIST DEVICE N/A 04/10/2019   Procedure: REMOVAL OF IMPELLA LEFT VENTRICULAR ASSIST DEVICE WITH INSERTION OF RIGHT FEMORAL ARTERIAL LINE;  Surgeon: Ivin Poot, MD;  Location: [facility];  Service: Open Heart Surgery;  Laterality: N/A;  . REMOVAL OF PLEURAL DRAINAGE CATHETER Left 07/02/2019   Procedure: REMOVAL OF PLEURAL DRAINAGE CATHETER;  Surgeon: Ivin Poot, MD;  Location: [facility];  Service: Thoracic;  Laterality: Left;  . RIGHT/LEFT HEART CATH AND CORONARY ANGIOGRAPHY N/A 03/15/2019   Procedure: RIGHT/LEFT HEART CATH AND CORONARY ANGIOGRAPHY;  Surgeon: Burnell Blanks, MD;  Location: [facility];  Service: Cardiovascular;  Laterality: N/A;  . TEE WITHOUT CARDIOVERSION N/A 03/16/2019   Procedure: TRANSESOPHAGEAL ECHOCARDIOGRAM (TEE);  Surgeon: Prescott Gum, Collier Salina, MD;  Location: [facility];  Service: Open Heart Surgery;  Laterality: N/A;  . TRACHEOSTOMY TUBE PLACEMENT N/A 03/27/2019   Procedure: TRACHEOSTOMY placed using [place] 8DCT Cuffed.;  Surgeon: Wonda Olds, MD;  [facility];  Service: Thoracic;  Laterality: N/A;  . TRANSURETHRAL RESECTION OF BLADDER NECK N/A 05/13/2019   Procedure: CYSTOSCOPY CLOT EVACUATION FULGRATION CYSTOGRAM AND  INSTILLATION OF FORMALIN;  Surgeon: Lucas Mallow, MD;  Location: [facility];  Service: Urology;  Laterality: N/A;    Family History  Problem Relation Age of Onset  . CAD Mother 46  . CAD Father 8  . CAD Sister   . CAD Brother     Social History Social History   Tobacco Use  . Smoking status: Former Smoker    Packs/day: 0.25    Years: 50.00    Pack years: 12.50    Types: Cigarettes    Quit date: 02/15/1990    Years since quitting: 29.7  . Smokeless tobacco: Never Used  Vaping Use  . Vaping Use: Never used  Substance Use Topics  . Alcohol use: No  . Drug use: No    Current Outpatient Medications  Medication Sig Dispense Refill  . acetaminophen (TYLENOL) 500 MG tablet Take 500 mg by mouth every 6 (six) hours as needed for moderate pain.    Marland Kitchen aspirin 81 MG chewable tablet Chew 81 mg by mouth in the morning.     Marland Kitchen  b complex-vitamin c-folic acid (NEPHRO-VITE) 0.8 MG TABS tablet Take 1 tablet by mouth daily.    . Calcium Carbonate-Vitamin D 600-400 MG-UNIT tablet Take 1 tablet by mouth daily.    . carboxymethylcellulose (REFRESH PLUS) 0.5 % SOLN Place 1 drop into both eyes 4 (four) times daily.    Marland Kitchen docusate sodium (COLACE) 100 MG capsule Take 1 capsule (100 mg total) by mouth 2 (two) times daily. 10 capsule 0  . Ensure Plus (ENSURE PLUS) LIQD Take 237 mLs by mouth 2 (two) times daily between meals.    . famotidine (PEPCID) 20 MG tablet Take 20 mg by mouth daily.     . midodrine (PROAMATINE) 10 MG tablet Take 10 mg by mouth 2 (two) times daily.     . midodrine (PROAMATINE) 5 MG tablet Take Every Tuesday, Thursday, Saturday 30 tablet 6  . Nutritional Supplements (FEEDING SUPPLEMENT, NEPRO CARB STEADY,) LIQD Take 237 mLs by mouth at bedtime.    Marland Kitchen nystatin ointment (MYCOSTATIN) Apply 1 application topically daily as needed for itching. Apply to Penis as needed if itching occurs.    . polyethylene glycol (MIRALAX / GLYCOLAX) 17 g packet Take 17 g by mouth daily. 14 each 0  .  PREDNISONE PO Take 60 mg by mouth daily.     Marland Kitchen senna (SENOKOT) 8.6 MG tablet Take 1 tablet by mouth as needed for constipation.     . sulfamethoxazole-trimethoprim (BACTRIM) 400-80 MG tablet Take 1 tablet by mouth 3 (three) times a week. Monday, Wednesday, Friday    . triamcinolone cream (KENALOG) 0.1 % Apply 1 application topically 2 (two) times daily. Apply to skin that is intact. DO NOT apply to "burn wounds".     No current facility-administered medications for this visit.    Allergies  Allergen Reactions  . Atorvastatin Anaphylaxis and Rash    Leg pain  . Adhesive [Tape]     Tears skin  . Amiodarone     Rash (see 05/2019 hospital notes), but was on other medications at the time so unclear which agent   . Reglan [Metoclopramide]     Rash (see 05/2019 hospital notes), but was on other medications at the time so unclear which agent  . Heparin Rash    Thrombocytopenia/ HIT    Review of Systems   Multiple areas of petechial hemorrhage and bruising from the prednisone Increased ankle edema Difficulty with dialysis yesterday and the dialysis run was cut back to 2 hours  BP 107/65   Pulse 83   Temp (!) 97.5 F (36.4 C)   Resp 20   Ht 5\' 8"  (1.727 m)   Wt 158 lb (71.7 kg)   SpO2 93%   BMI 24.02 kg/m  Physical Exam Chronically ill 86 year old male in a wheelchair Diminished breath sounds at the left base Irregular heart rhythm without murmur 2+ ankle edema Ecchymotic areas of his hands and arms from prednisone No focal neuro deficit  Diagnostic Tests: Chest x-ray performed today personally viewed showing a new moderate left pleural effusion  Impression: Recurrent left pleural effusion.  The patient will be set up for a outpatient left thoracentesis at [facility] interventional radiology. He will return with chest x-ray here in a month.  His atrial fibrillation is rate controlled and patient has no medical options with allergy to amiodarone and soft blood pressure  which would not tolerate a beta-blocker-he is on midodrine Plan: Thoracentesis then return for chest x-ray next month  Len Childs, MD [facility] [phone]

## 2019-11-15 ENCOUNTER — Ambulatory Visit: Payer: TRICARE For Life (TFL) | Admitting: Physical Medicine & Rehabilitation

## 2019-11-16 ENCOUNTER — Ambulatory Visit (HOSPITAL_COMMUNITY): Admission: RE | Admit: 2019-11-16 | Payer: Medicare Other | Source: Ambulatory Visit

## 2019-11-16 ENCOUNTER — Other Ambulatory Visit: Payer: Self-pay | Admitting: *Deleted

## 2019-11-16 DIAGNOSIS — J90 Pleural effusion, not elsewhere classified: Secondary | ICD-10-CM

## 2019-11-19 ENCOUNTER — Ambulatory Visit (HOSPITAL_COMMUNITY)
Admission: RE | Admit: 2019-11-19 | Discharge: 2019-11-19 | Disposition: A | Discharge status: Home / Self Care | Payer: Medicare Other | Source: Ambulatory Visit | Attending: Cardiothoracic Surgery | Admitting: Cardiothoracic Surgery

## 2019-11-19 ENCOUNTER — Encounter (HOSPITAL_COMMUNITY): Payer: Self-pay

## 2019-11-19 ENCOUNTER — Ambulatory Visit: Payer: TRICARE For Life (TFL) | Admitting: Physical Medicine & Rehabilitation

## 2019-11-19 ENCOUNTER — Ambulatory Visit (HOSPITAL_COMMUNITY)
Admission: RE | Admit: 2019-11-19 | Discharge: 2019-11-19 | Disposition: A | Discharge status: Home / Self Care | Payer: Medicare Other | Source: Ambulatory Visit | Attending: Diagnostic Radiology | Admitting: Diagnostic Radiology

## 2019-11-19 ENCOUNTER — Other Ambulatory Visit: Payer: Self-pay

## 2019-11-19 DIAGNOSIS — Z9889 Other specified postprocedural states: Secondary | ICD-10-CM

## 2019-11-19 DIAGNOSIS — J90 Pleural effusion, not elsewhere classified: Secondary | ICD-10-CM | POA: Diagnosis not present

## 2019-11-19 NOTE — Procedures (Signed)
PreOperative Dx: LT pleural effusion Postoperative Dx: LT pleural effusion Procedure:   US guided LT thoracentesis Radiologist:  Thornton Papas Anesthesia:  10 ml of 1% lidocaine Specimen:  750 mL of yellow colored fluid EBL:   < 1 ml Complications: None

## 2019-11-19 NOTE — Sedation Documentation (Signed)
PT tolerated left sided thoracentesis procedure well today and 750 mL amber cloudy pleural fluid removed. PT taken to xray and read by radiologist. PT return with nurse from SNF to be discharged back to [facility] at this time. PT and nurse present with patient (April, RN) verbalized understanding of discharge instructions and pt left with NAD noted.

## 2019-11-19 NOTE — Discharge Instructions (Signed)
Pleural Effusion Pleural effusion is an abnormal buildup of fluid in the layers of tissue between the lungs and the inside of the chest (pleural space) The two layers of tissue that line the lungs and the inside of the chest are called pleura. Usually, there is no air in the space between the pleura, only a thin layer of fluid. Some conditions can cause a large amount of fluid to build up, which can cause the lung to collapse if untreated. A pleural effusion is usually caused by another disease that requires treatment. What are the causes? Pleural effusion can be caused by:  Heart failure.  Certain infections, such as pneumonia or tuberculosis.  Cancer.  A blood clot in the lung (pulmonary embolism).  Complications from surgery, such as from open heart surgery.  Liver disease (cirrhosis).  Kidney disease. What are the signs or symptoms? In some cases, pleural effusion may cause no symptoms. If symptoms are present, they may include:  Shortness of breath, especially when lying down.  Chest pain. This may get worse when taking a deep breath.  Fever.  Dry, long-lasting (chronic) cough.  Hiccups.  Rapid breathing. An underlying condition that is causing the pleural effusion (such as heart failure, pneumonia, blood clots, tuberculosis, or cancer) may also cause other symptoms. How is this diagnosed? This condition may be diagnosed based on:  Your symptoms and medical history.  A physical exam.  A chest X-ray.  A procedure to use a needle to remove fluid from the pleural space (thoracentesis). This fluid is tested.  Other imaging studies of the chest, such as ultrasound or CT scan. How is this treated? Depending on the cause of your condition, treatment may include:  Treating the underlying condition that is causing the effusion. When that condition improves, the effusion will also improve. Examples of treatment for underlying conditions include: ? Antibiotic medicines  to treat an infection. ? Diuretics or other heart medicines to treat heart failure.  Thoracentesis.  Placing a thin flexible tube under your skin and into your chest to continuously drain the effusion (indwelling pleural catheter).  Surgery to remove the outer layer of tissue from the pleural space (decortication).  A procedure to put medicine into the chest cavity to seal the pleural space and prevent fluid buildup (pleurodesis).  Chemotherapy and radiation therapy, if you have cancerous (malignant) pleural effusion. These treatments are typically used to treat cancer. They kill certain cells in the body. Follow these instructions at home:  Take over-the-counter and prescription medicines only as told by your health care provider.  Ask your health care provider what activities are safe for you.  Keep track of how long you are able to do mild exercise (such as walking) before you get short of breath. Write down this information to share with your health care provider. Your ability to exercise should improve over time.  Do not use any products that contain nicotine or tobacco, such as cigarettes and e-cigarettes. If you need help quitting, ask your health care provider.  Keep all follow-up visits as told by your health care provider. This is important. Contact a health care provider if:  The amount of time that you are able to do mild exercise: ? Decreases. ? Does not improve with time.  You have a fever. Get help right away if:  You are short of breath.  You develop chest pain.  You develop a new cough. Summary  Pleural effusion is an abnormal buildup of fluid in the  layers of tissue between the lungs and the inside of the chest.  Pleural effusion can have many causes, including heart failure, pulmonary embolism, infections, or cancer.  Symptoms of pleural effusion can include shortness of breath, chest pain, fever, long-lasting (chronic) cough, hiccups, or rapid  breathing.  Diagnosis often involves making images of the chest (such as with ultrasound or X-ray) and removing fluid (thoracentesis) to send for testing.  Treatment for pleural effusion depends on what underlying condition is causing it. This information is not intended to replace advice given to you by your health care provider. Make sure you discuss any questions you have with your health care provider. Document Revised: 01/14/2017 Document Reviewed: 10/07/2016 Elsevier Patient Education  [place].   Pleural Effusion Pleural effusion is an abnormal buildup of fluid in the layers of tissue between the lungs and the inside of the chest (pleural space) The two layers of tissue that line the lungs and the inside of the chest are called pleura. Usually, there is no air in the space between the pleura, only a thin layer of fluid. Some conditions can cause a large amount of fluid to build up, which can cause the lung to collapse if untreated. A pleural effusion is usually caused by another disease that requires treatment. What are the causes? Pleural effusion can be caused by:  Heart failure.  Certain infections, such as pneumonia or tuberculosis.  Cancer.  A blood clot in the lung (pulmonary embolism).  Complications from surgery, such as from open heart surgery.  Liver disease (cirrhosis).  Kidney disease. What are the signs or symptoms? In some cases, pleural effusion may cause no symptoms. If symptoms are present, they may include:  Shortness of breath, especially when lying down.  Chest pain. This may get worse when taking a deep breath.  Fever.  Dry, long-lasting (chronic) cough.  Hiccups.  Rapid breathing. An underlying condition that is causing the pleural effusion (such as heart failure, pneumonia, blood clots, tuberculosis, or cancer) may also cause other symptoms. How is this diagnosed? This condition may be diagnosed based on:  Your symptoms and medical  history.  A physical exam.  A chest X-ray.  A procedure to use a needle to remove fluid from the pleural space (thoracentesis). This fluid is tested.  Other imaging studies of the chest, such as ultrasound or CT scan. How is this treated? Depending on the cause of your condition, treatment may include:  Treating the underlying condition that is causing the effusion. When that condition improves, the effusion will also improve. Examples of treatment for underlying conditions include: ? Antibiotic medicines to treat an infection. ? Diuretics or other heart medicines to treat heart failure.  Thoracentesis.  Placing a thin flexible tube under your skin and into your chest to continuously drain the effusion (indwelling pleural catheter).  Surgery to remove the outer layer of tissue from the pleural space (decortication).  A procedure to put medicine into the chest cavity to seal the pleural space and prevent fluid buildup (pleurodesis).  Chemotherapy and radiation therapy, if you have cancerous (malignant) pleural effusion. These treatments are typically used to treat cancer. They kill certain cells in the body. Follow these instructions at home:  Take over-the-counter and prescription medicines only as told by your health care provider.  Ask your health care provider what activities are safe for you.  Keep track of how long you are able to do mild exercise (such as walking) before you get short of  breath. Write down this information to share with your health care provider. Your ability to exercise should improve over time.  Do not use any products that contain nicotine or tobacco, such as cigarettes and e-cigarettes. If you need help quitting, ask your health care provider.  Keep all follow-up visits as told by your health care provider. This is important. Contact a health care provider if:  The amount of time that you are able to do mild exercise: ? Decreases. ? Does not improve  with time.  You have a fever. Get help right away if:  You are short of breath.  You develop chest pain.  You develop a new cough. Summary  Pleural effusion is an abnormal buildup of fluid in the layers of tissue between the lungs and the inside of the chest.  Pleural effusion can have many causes, including heart failure, pulmonary embolism, infections, or cancer.  Symptoms of pleural effusion can include shortness of breath, chest pain, fever, long-lasting (chronic) cough, hiccups, or rapid breathing.  Diagnosis often involves making images of the chest (such as with ultrasound or X-ray) and removing fluid (thoracentesis) to send for testing.  Treatment for pleural effusion depends on what underlying condition is causing it. This information is not intended to replace advice given to you by your health care provider. Make sure you discuss any questions you have with your health care provider. Document Revised: 01/14/2017 Document Reviewed: 10/07/2016 Elsevier Patient Education  [place]. Thoracentesis, Care After This sheet gives you information about how to care for yourself after your procedure. Your health care provider may also give you more specific instructions. If you have problems or questions, contact your health care provider. What can I expect after the procedure? After your procedure, it is common to have some pain at the site where the needle was inserted (puncture site). Follow these instructions at home:  Care of the puncture site  Follow instructions from your health care provider about how to take care of your puncture site. Make sure you: ? Wash your hands with soap and water before you change your bandage (dressing). If soap and water are not available, use hand sanitizer. ? Change your dressing as told by your health care provider.  Check the puncture site every day for signs of infection. Check for: ? Redness, swelling, or pain. ? Fluid or  blood. ? Warmth. ? Pus or a bad smell.  Do not take baths, swim, or use a hot tub until your health care provider approves. General instructions  Take over-the-counter and prescription medicines only as told by your health care provider.  Do not drive for 24 hours if you were given a medicine to help you relax (sedative) during your procedure.  Drink enough fluid to keep your urine pale yellow.  You may return to your normal diet and normal activities as told by your health care provider.  Keep all follow-up visits as told by your health care provider. This is important. Contact a health care provider if you:  Have redness, swelling, or pain at your puncture site.  Have fluid or blood coming from your puncture site.  Notice that your puncture site feels warm to the touch.  Have pus or a bad smell coming from your puncture site.  Have a fever.  Have chills.  Have nausea or vomiting.  Have trouble breathing.  Develop a worsening cough. Get help right away if you:  Have extreme shortness of breath.  Develop chest pain.  Faint or feel light-headed. Summary  After your procedure, it is common to have some pain at the site where the needle was inserted (puncture site).  Wash your hands with soap and water before you change your bandage (dressing).  Check your puncture site every day for signs of infection.  Take over-the-counter and prescription medicines only as told by your health care provider. This information is not intended to replace advice given to you by your health care provider. Make sure you discuss any questions you have with your health care provider. Document Revised: 01/14/2017 Document Reviewed: 12/27/2016 Elsevier Patient Education  2020 [employer].

## 2019-11-21 ENCOUNTER — Encounter (HOSPITAL_COMMUNITY): Payer: Medicare Other

## 2019-11-21 ENCOUNTER — Ambulatory Visit (INDEPENDENT_AMBULATORY_CARE_PROVIDER_SITE_OTHER): Payer: Self-pay | Admitting: Physician Assistant

## 2019-11-21 ENCOUNTER — Other Ambulatory Visit: Payer: Self-pay

## 2019-11-21 VITALS — BP 91/53 | HR 102 | Temp 97.6°F | Resp 20 | Ht 68.0 in | Wt 157.0 lb

## 2019-11-21 DIAGNOSIS — N2581 Secondary hyperparathyroidism of renal origin: Secondary | ICD-10-CM

## 2019-11-21 DIAGNOSIS — N186 End stage renal disease: Secondary | ICD-10-CM

## 2019-11-21 DIAGNOSIS — Z992 Dependence on renal dialysis: Secondary | ICD-10-CM

## 2019-11-21 NOTE — Progress Notes (Signed)
POST OPERATIVE DIALYSIS ACCESS OFFICE NOTE    CC:  F/u for dialysis access surgery  HPI:  This is a 86 y.o. male who is s/p second stage right basilic vein transposition on 11/06/2019 by Dr. Scot Dock. First stage performed on 09/10/2019. His son accompanies him today. Resident of NH in [place].  Continues on prednisone for recent diagnosis of pemphigus vulgaris.  Dialysis access: currently left IJ TDC (exchanged 05/20/2019 by IR) Dialysis days:  TTS Dialysis center: [facility]  Allergies  Allergen Reactions  . Atorvastatin Anaphylaxis and Rash    Leg pain  . Adhesive [Tape]     Tears skin  . Amiodarone     Rash (see 05/2019 hospital notes), but was on other medications at the time so unclear which agent   . Reglan [Metoclopramide]     Rash (see 05/2019 hospital notes), but was on other medications at the time so unclear which agent  . Heparin Rash    Thrombocytopenia/ HIT    Current Outpatient Medications  Medication Sig Dispense Refill  . acetaminophen (TYLENOL) 500 MG tablet Take 500 mg by mouth every 6 (six) hours as needed for moderate pain.    Marland Kitchen aspirin 81 MG chewable tablet Chew 81 mg by mouth in the morning.     Marland Kitchen b complex-vitamin c-folic acid (NEPHRO-VITE) 0.8 MG TABS tablet Take 1 tablet by mouth daily.    . Calcium Carbonate-Vitamin D 600-400 MG-UNIT tablet Take 1 tablet by mouth daily.    . carboxymethylcellulose (REFRESH PLUS) 0.5 % SOLN Place 1 drop into both eyes 4 (four) times daily.    Marland Kitchen docusate sodium (COLACE) 100 MG capsule Take 1 capsule (100 mg total) by mouth 2 (two) times daily. 10 capsule 0  . Ensure Plus (ENSURE PLUS) LIQD Take 237 mLs by mouth 2 (two) times daily between meals.    . famotidine (PEPCID) 20 MG tablet Take 20 mg by mouth daily.     . midodrine (PROAMATINE) 10 MG tablet Take 10 mg by mouth 2 (two) times daily.     . midodrine (PROAMATINE) 5 MG tablet Take Every Tuesday, Thursday, Saturday 30 tablet 6  . Nutritional Supplements  (FEEDING SUPPLEMENT, NEPRO CARB STEADY,) LIQD Take 237 mLs by mouth at bedtime.    Marland Kitchen nystatin ointment (MYCOSTATIN) Apply 1 application topically daily as needed for itching. Apply to Penis as needed if itching occurs.    . polyethylene glycol (MIRALAX / GLYCOLAX) 17 g packet Take 17 g by mouth daily. 14 each 0  . PREDNISONE PO Take 60 mg by mouth daily.     Marland Kitchen senna (SENOKOT) 8.6 MG tablet Take 1 tablet by mouth as needed for constipation.     . sulfamethoxazole-trimethoprim (BACTRIM) 400-80 MG tablet Take 1 tablet by mouth 3 (three) times a week. Monday, Wednesday, Friday    . triamcinolone cream (KENALOG) 0.1 % Apply 1 application topically 2 (two) times daily. Apply to skin that is intact. DO NOT apply to "burn wounds".     No current facility-administered medications for this visit.     ROS:  See HPI  There were no vitals taken for this visit.   Physical Exam:  General appearance: Somewhat chronically ill-appearing male who is otherwise well-developed, well-nourished and in no apparent distress Cardiac: Heart rate and rhythm are regular Respiratory: Mild resting dyspnea Incision:  Right upper arm incision healing without signs of infection.  There is some mild ecchymosis of the incision. Extremities: Right hand is warm and well-perfused.  Grip strength is 5/5.  Palpable basilic vein fistula in the upper arm.  Good bruit and thrill.    Assessment/Plan:   -pt does not have evidence of steal syndrome -dialysis duplex today reveals fistula to be of adequate diameter and depth for access -the fistula/graft may be used starting December 18, 2019 -once access with fistula well-established, removal of [facility] per nephrology team -follow-up PRN  Barbie Banner, PA-C 11/21/2019 9:25 AM [facility] [phone]  Clinic MD:  Scot Dock

## 2019-11-23 ENCOUNTER — Other Ambulatory Visit: Payer: Self-pay

## 2019-11-23 ENCOUNTER — Inpatient Hospital Stay (HOSPITAL_COMMUNITY)
Admission: EM | Admit: 2019-11-23 | Discharge: 2019-11-25 | DRG: 393 | Disposition: A | Discharge status: Skilled Nursing Facility | Payer: Medicare Other | Source: Skilled Nursing Facility | Attending: Internal Medicine | Admitting: Internal Medicine

## 2019-11-23 ENCOUNTER — Encounter (HOSPITAL_COMMUNITY): Payer: Self-pay

## 2019-11-23 DIAGNOSIS — K219 Gastro-esophageal reflux disease without esophagitis: Secondary | ICD-10-CM | POA: Diagnosis present

## 2019-11-23 DIAGNOSIS — K5731 Diverticulosis of large intestine without perforation or abscess with bleeding: Secondary | ICD-10-CM | POA: Diagnosis present

## 2019-11-23 DIAGNOSIS — K625 Hemorrhage of anus and rectum: Secondary | ICD-10-CM | POA: Diagnosis present

## 2019-11-23 DIAGNOSIS — K626 Ulcer of anus and rectum: Secondary | ICD-10-CM | POA: Diagnosis present

## 2019-11-23 DIAGNOSIS — Z87892 Personal history of anaphylaxis: Secondary | ICD-10-CM

## 2019-11-23 DIAGNOSIS — K922 Gastrointestinal hemorrhage, unspecified: Secondary | ICD-10-CM | POA: Diagnosis not present

## 2019-11-23 DIAGNOSIS — K5641 Fecal impaction: Secondary | ICD-10-CM | POA: Diagnosis not present

## 2019-11-23 DIAGNOSIS — I5032 Chronic diastolic (congestive) heart failure: Secondary | ICD-10-CM | POA: Diagnosis not present

## 2019-11-23 DIAGNOSIS — Z951 Presence of aortocoronary bypass graft: Secondary | ICD-10-CM

## 2019-11-23 DIAGNOSIS — L100 Pemphigus vulgaris: Secondary | ICD-10-CM | POA: Diagnosis present

## 2019-11-23 DIAGNOSIS — I252 Old myocardial infarction: Secondary | ICD-10-CM | POA: Diagnosis not present

## 2019-11-23 DIAGNOSIS — I132 Hypertensive heart and chronic kidney disease with heart failure and with stage 5 chronic kidney disease, or end stage renal disease: Secondary | ICD-10-CM | POA: Diagnosis present

## 2019-11-23 DIAGNOSIS — Z66 Do not resuscitate: Secondary | ICD-10-CM | POA: Diagnosis present

## 2019-11-23 DIAGNOSIS — Z8719 Personal history of other diseases of the digestive system: Secondary | ICD-10-CM | POA: Diagnosis not present

## 2019-11-23 DIAGNOSIS — D631 Anemia in chronic kidney disease: Secondary | ICD-10-CM | POA: Diagnosis present

## 2019-11-23 DIAGNOSIS — I10 Essential (primary) hypertension: Secondary | ICD-10-CM | POA: Diagnosis present

## 2019-11-23 DIAGNOSIS — I959 Hypotension, unspecified: Secondary | ICD-10-CM | POA: Diagnosis present

## 2019-11-23 DIAGNOSIS — D62 Acute posthemorrhagic anemia: Secondary | ICD-10-CM | POA: Diagnosis present

## 2019-11-23 DIAGNOSIS — I480 Paroxysmal atrial fibrillation: Secondary | ICD-10-CM | POA: Diagnosis present

## 2019-11-23 DIAGNOSIS — E1122 Type 2 diabetes mellitus with diabetic chronic kidney disease: Secondary | ICD-10-CM | POA: Diagnosis present

## 2019-11-23 DIAGNOSIS — Z8546 Personal history of malignant neoplasm of prostate: Secondary | ICD-10-CM

## 2019-11-23 DIAGNOSIS — N2581 Secondary hyperparathyroidism of renal origin: Secondary | ICD-10-CM | POA: Diagnosis present

## 2019-11-23 DIAGNOSIS — I2583 Coronary atherosclerosis due to lipid rich plaque: Secondary | ICD-10-CM | POA: Diagnosis present

## 2019-11-23 DIAGNOSIS — I2510 Atherosclerotic heart disease of native coronary artery without angina pectoris: Secondary | ICD-10-CM | POA: Diagnosis present

## 2019-11-23 DIAGNOSIS — Z20822 Contact with and (suspected) exposure to covid-19: Secondary | ICD-10-CM | POA: Diagnosis present

## 2019-11-23 DIAGNOSIS — I5042 Chronic combined systolic (congestive) and diastolic (congestive) heart failure: Secondary | ICD-10-CM | POA: Diagnosis present

## 2019-11-23 DIAGNOSIS — D125 Benign neoplasm of sigmoid colon: Secondary | ICD-10-CM | POA: Diagnosis not present

## 2019-11-23 DIAGNOSIS — Z7982 Long term (current) use of aspirin: Secondary | ICD-10-CM | POA: Diagnosis not present

## 2019-11-23 DIAGNOSIS — Z9861 Coronary angioplasty status: Secondary | ICD-10-CM | POA: Diagnosis not present

## 2019-11-23 DIAGNOSIS — Z91048 Other nonmedicinal substance allergy status: Secondary | ICD-10-CM

## 2019-11-23 DIAGNOSIS — N186 End stage renal disease: Secondary | ICD-10-CM | POA: Diagnosis present

## 2019-11-23 DIAGNOSIS — K5909 Other constipation: Secondary | ICD-10-CM | POA: Diagnosis present

## 2019-11-23 DIAGNOSIS — Z888 Allergy status to other drugs, medicaments and biological substances status: Secondary | ICD-10-CM

## 2019-11-23 DIAGNOSIS — Z8249 Family history of ischemic heart disease and other diseases of the circulatory system: Secondary | ICD-10-CM

## 2019-11-23 DIAGNOSIS — Z8679 Personal history of other diseases of the circulatory system: Secondary | ICD-10-CM

## 2019-11-23 DIAGNOSIS — Z992 Dependence on renal dialysis: Secondary | ICD-10-CM | POA: Diagnosis not present

## 2019-11-23 DIAGNOSIS — Z8701 Personal history of pneumonia (recurrent): Secondary | ICD-10-CM

## 2019-11-23 DIAGNOSIS — Z79899 Other long term (current) drug therapy: Secondary | ICD-10-CM

## 2019-11-23 DIAGNOSIS — Z87891 Personal history of nicotine dependence: Secondary | ICD-10-CM

## 2019-11-23 DIAGNOSIS — I255 Ischemic cardiomyopathy: Secondary | ICD-10-CM | POA: Diagnosis present

## 2019-11-23 DIAGNOSIS — Z923 Personal history of irradiation: Secondary | ICD-10-CM

## 2019-11-23 DIAGNOSIS — K6389 Other specified diseases of intestine: Secondary | ICD-10-CM | POA: Diagnosis not present

## 2019-11-23 LAB — CBC WITH DIFFERENTIAL/PLATELET
Abs Immature Granulocytes: 0.25 10*3/uL — ABNORMAL HIGH (ref 0.00–0.07)
Basophils Absolute: 0 10*3/uL (ref 0.0–0.1)
Basophils Relative: 0 %
Eosinophils Absolute: 0 10*3/uL (ref 0.0–0.5)
Eosinophils Relative: 0 %
HCT: 22.1 % — ABNORMAL LOW (ref 39.0–52.0)
Hemoglobin: 6.6 g/dL — CL (ref 13.0–17.0)
Immature Granulocytes: 4 %
Lymphocytes Relative: 8 %
Lymphs Abs: 0.5 10*3/uL — ABNORMAL LOW (ref 0.7–4.0)
MCH: 31.7 pg (ref 26.0–34.0)
MCHC: 29.9 g/dL — ABNORMAL LOW (ref 30.0–36.0)
MCV: 106.3 fL — ABNORMAL HIGH (ref 80.0–100.0)
Monocytes Absolute: 0.5 10*3/uL (ref 0.1–1.0)
Monocytes Relative: 8 %
Neutro Abs: 5 10*3/uL (ref 1.7–7.7)
Neutrophils Relative %: 80 %
Platelets: 192 10*3/uL (ref 150–400)
RBC: 2.08 MIL/uL — ABNORMAL LOW (ref 4.22–5.81)
RDW: 19.5 % — ABNORMAL HIGH (ref 11.5–15.5)
WBC: 6.2 10*3/uL (ref 4.0–10.5)
nRBC: 0.5 % — ABNORMAL HIGH (ref 0.0–0.2)

## 2019-11-23 LAB — COMPREHENSIVE METABOLIC PANEL
ALT: 12 U/L (ref 0–44)
AST: 22 U/L (ref 15–41)
Albumin: 2.2 g/dL — ABNORMAL LOW (ref 3.5–5.0)
Alkaline Phosphatase: 51 U/L (ref 38–126)
Anion gap: 14 (ref 5–15)
BUN: 28 mg/dL — ABNORMAL HIGH (ref 8–23)
CO2: 28 mmol/L (ref 22–32)
Calcium: 7.6 mg/dL — ABNORMAL LOW (ref 8.9–10.3)
Chloride: 93 mmol/L — ABNORMAL LOW (ref 98–111)
Creatinine, Ser: 3.04 mg/dL — ABNORMAL HIGH (ref 0.61–1.24)
GFR, Estimated: 17 mL/min — ABNORMAL LOW (ref >60)
Glucose, Bld: 212 mg/dL — ABNORMAL HIGH (ref 70–99)
Potassium: 3.5 mmol/L (ref 3.5–5.1)
Sodium: 135 mmol/L (ref 135–145)
Total Bilirubin: 0.5 mg/dL (ref 0.3–1.2)
Total Protein: 4.8 g/dL — ABNORMAL LOW (ref 6.5–8.1)

## 2019-11-23 LAB — RESPIRATORY PANEL BY RT PCR (FLU A&B, COVID)
Influenza A by PCR: NEGATIVE
Influenza B by PCR: NEGATIVE
SARS Coronavirus 2 by RT PCR: NEGATIVE

## 2019-11-23 LAB — LACTIC ACID, PLASMA: Lactic Acid, Venous: 4.3 mmol/L (ref 0.5–1.9)

## 2019-11-23 LAB — PROTIME-INR
INR: 1.1 (ref 0.8–1.2)
Prothrombin Time: 13.8 seconds (ref 11.4–15.2)

## 2019-11-23 LAB — AMMONIA: Ammonia: 44 umol/L — ABNORMAL HIGH (ref 9–35)

## 2019-11-23 LAB — PREPARE RBC (CROSSMATCH)

## 2019-11-23 MED ORDER — SODIUM CHLORIDE 0.9 % IV SOLN
8.0000 mg/h | INTRAVENOUS | Status: DC
  Administered 2019-11-23: 8 mg/h via INTRAVENOUS
  Filled 2019-11-23 (×8): qty 80

## 2019-11-23 MED ORDER — ONDANSETRON HCL 4 MG/2ML IJ SOLN: 4.0000 mg | Freq: Four times a day (QID) | INTRAMUSCULAR | Status: DC | PRN

## 2019-11-23 MED ORDER — SODIUM CHLORIDE 0.9 % IV SOLN
80.0000 mg | Freq: Once | INTRAVENOUS | Status: AC
  Administered 2019-11-23: 80 mg via INTRAVENOUS
  Filled 2019-11-23: qty 80

## 2019-11-23 MED ORDER — MIDODRINE HCL 5 MG PO TABS
10.0000 mg | ORAL_TABLET | Freq: Two times a day (BID) | ORAL | Status: DC
  Administered 2019-11-23 – 2019-11-25 (×4): 10 mg via ORAL
  Filled 2019-11-23 (×14): qty 2

## 2019-11-23 MED ORDER — DOCUSATE SODIUM 100 MG PO CAPS
100.0000 mg | ORAL_CAPSULE | Freq: Two times a day (BID) | ORAL | Status: DC
  Administered 2019-11-23 – 2019-11-25 (×3): 100 mg via ORAL
  Filled 2019-11-23 (×7): qty 1

## 2019-11-23 MED ORDER — ONDANSETRON HCL 4 MG PO TABS: 4.0000 mg | ORAL_TABLET | Freq: Four times a day (QID) | ORAL | Status: DC | PRN

## 2019-11-23 MED ORDER — FAMOTIDINE 20 MG PO TABS
20.0000 mg | ORAL_TABLET | Freq: Every day | ORAL | Status: DC
  Administered 2019-11-23: 20 mg via ORAL
  Filled 2019-11-23: qty 1

## 2019-11-23 MED ORDER — SODIUM CHLORIDE 0.9 % IV SOLN: 10.0000 mL/h | Freq: Once | INTRAVENOUS | Status: AC

## 2019-11-23 MED ORDER — SENNA 8.6 MG PO TABS: 1.0000 | ORAL_TABLET | ORAL | Status: DC | PRN

## 2019-11-23 MED ORDER — POLYETHYLENE GLYCOL 3350 17 G PO PACK
17.0000 g | PACK | Freq: Every day | ORAL | Status: DC
  Administered 2019-11-23: 17 g via ORAL
  Filled 2019-11-23: qty 1

## 2019-11-23 NOTE — ED Triage Notes (Signed)
Pt brought in by EMS from [place] due to blood in stools since last yesterday. Systolic BP in the 05'R. No pain

## 2019-11-23 NOTE — ED Notes (Signed)
Have paged pharmacy for Protonix

## 2019-11-23 NOTE — H&P (Signed)
History and Physical  CAMBRIDGE DELEO LKG:[id number] DOB: 03-20-1928 DOA: 11/23/2019  Referring physician: Margarita Mail, PA-C, ED physician PCP: Leeanne Rio, MD  Outpatient Specialists:   Patient Coming From: [facility]  Chief Complaint: Rectal bleeding  HPI: Jesse Murphy is a 86 y.o. 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  Patient seen for rectal bleeding that started intermittently over the past few days and more intensely this morning.  He denies abdominal pain, chest pain, shortness of breath.  Denies nausea, vomiting.  No palliating or provoking factors.  The blood is maroon color and not as mixed with any stools.  He does have a history of chronic constipation with a rectal ulceration secondary to constipation.  Emergency Department Course: CBC shows hemoglobin of 6.6.  2 units of blood typed and crossmatched.  Review of Systems:   Pt denies any fevers, chills, nausea, vomiting, diarrhea, constipation, abdominal pain, shortness of breath, dyspnea on exertion, orthopnea, cough, wheezing, palpitations, headache, vision changes, lightheadedness, dizziness.  Review of systems are otherwise negative  Past Medical History:  Diagnosis Date  . Anemia   . CAD (coronary artery disease)    a. s/p PCI in 1992 b. Coronary CT in 01/2018 showing extensive coronary calcification; FFR indeterminate --> medical management pursued at that time as patient not interested in repeat cath. c. s/p CABGx3 in 02/2019.  . Cancer [facility])    prostate  . Cardiogenic shock ([place])   . CHB (complete heart block) (HCC)    a. due to vagal event in 2021, requiring temp pacer.  . Chronic combined systolic and diastolic CHF (congestive heart failure) ([place])     . Debilitated   . Decubitus ulcer of coccyx, unspecified pressure ulcer stage   . Dyspnea   . End stage renal disease ([place])   . Esophagitis   . H/O tracheostomy    a. during admit in 2021 for respiratory failure.  . Hematuria   . Hypertension   . Hypoalbuminemia   . Ischemic cardiomyopathy   . Myocardial infarction ([place])   . PAF (paroxysmal atrial fibrillation) ([place])   . 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 Pleural effusion, left    a. s/p pleurX catheter  . Pressure ulcer of both heels   . Pseudomonas pneumonia ([place])   . Thrombocytopenia ([place])   . Walker as ambulation aid    also uses wheelchair   Past Surgical History:  Procedure Laterality Date  . BASCILIC VEIN TRANSPOSITION Right 09/10/2019   Procedure: RIGHT ARM 1ST STAGE BASCILIC VEIN TRANSPOSITION;  Surgeon: Angelia Mould, MD;  Location: [place];  Service: Vascular;  Laterality: Right;  . BASCILIC VEIN TRANSPOSITION Right 11/06/2019   Procedure: RIGHT UPPER EXTREMITY SECOND STAGE BASCILIC VEIN TRANSPOSITION;  Surgeon: Angelia Mould, MD;  Location: [place];  Service: Vascular;  Laterality: Right;  . BIOPSY  10/15/2019   Procedure: BIOPSY;  Surgeon: Daneil Dolin, MD;  [facility];  Service: Endoscopy;;  Distal Rectal Ulceration biopsies   . CHEST TUBE INSERTION Left 04/23/2019   Procedure: INSERTION PLEURAL DRAINAGE CATHETER TO DRAIN LEFT PLEURAL EFFUSION;  Surgeon: Ivin Poot, MD;  Location: [place];  Service: Thoracic;  Laterality: Left;  . CLIPPING OF ATRIAL APPENDAGE N/A 03/16/2019   Procedure: CLIPPING OF LEFT  ATRIAL APPENDAGE - USING  ATRICLIP SIZE 40;  Surgeon: Prescott Gum, Collier Salina, MD;  Location: [place];  Service: Open Heart Surgery;  Laterality: N/A;  . COLONOSCOPY N/A 08/25/2017   Procedure: COLONOSCOPY;  Surgeon: Rogene Houston, MD;  [facility];  Service: Endoscopy;  Laterality: N/A;  . CORONARY ARTERY BYPASS GRAFT N/A 03/16/2019   Procedure: CORONARY ARTERY BYPASS GRAFTING (CABG), ON  PUMP, TIMES THREE, USING LEFT INTERNAL MAMMARY ARTERY, RIGHT GREATER SAPHENOUS VEIN HARVESTED ENDOSCOPICALLY;  Surgeon: Ivin Poot, MD;  Location: [place];  Service: Open Heart Surgery;  Laterality: N/A;  swan only  . CYSTOSCOPY WITH FULGERATION N/A 04/30/2019   Procedure: CYSTOSCOPY WITH FULGERATION;  Surgeon: Irine Seal, MD;  Location: [place];  Service: Urology;  Laterality: N/A;  . FLEXIBLE SIGMOIDOSCOPY N/A 10/15/2019   Procedure: FLEXIBLE SIGMOIDOSCOPY with propofol;  Surgeon: Daneil Dolin, MD;  [facility];  Service: Endoscopy;  Laterality: N/A;  . HERNIA REPAIR    . IR FLUORO GUIDE CV LINE LEFT  04/23/2019  . IR FLUORO GUIDE CV LINE RIGHT  05/24/2019  . IR US GUIDE VASC ACCESS LEFT  04/23/2019  . MAZE N/A 03/16/2019   Procedure: MAZE;  Surgeon: Ivin Poot, MD;  Location: [place];  Service: Open Heart Surgery;  Laterality: N/A;  . PLACEMENT OF IMPELLA LEFT VENTRICULAR ASSIST DEVICE Right 03/27/2019   Procedure: Placement Of Impella Left Ventricular Assist Device using ABIOMED Impella 5.5 with SmartAssist Device.;  Surgeon: Wonda Olds, MD;  Location: [facility];  Service: Thoracic;  Laterality: Right;  . PROSTATE SURGERY    . REMOVAL OF IMPELLA LEFT VENTRICULAR ASSIST DEVICE N/A 04/10/2019   Procedure: REMOVAL OF IMPELLA LEFT VENTRICULAR ASSIST DEVICE WITH INSERTION OF RIGHT FEMORAL ARTERIAL LINE;  Surgeon: Ivin Poot, MD;  Location: [place];  Service: Open Heart Surgery;  Laterality: N/A;  . REMOVAL OF PLEURAL DRAINAGE CATHETER Left 07/02/2019   Procedure: REMOVAL OF PLEURAL DRAINAGE CATHETER;  Surgeon: Ivin Poot, MD;  Location: [place];  Service: Thoracic;  Laterality: Left;  . RIGHT/LEFT HEART CATH AND CORONARY ANGIOGRAPHY N/A 03/15/2019   Procedure: RIGHT/LEFT HEART CATH AND CORONARY ANGIOGRAPHY;  Surgeon: Burnell Blanks, MD;  Location: [place] CV [facility];  Service: Cardiovascular;  Laterality: N/A;  . TEE WITHOUT CARDIOVERSION N/A 03/16/2019   Procedure:  TRANSESOPHAGEAL ECHOCARDIOGRAM (TEE);  Surgeon: Prescott Gum, Collier Salina, MD;  Location: [place];  Service: Open Heart Surgery;  Laterality: N/A;  . TRACHEOSTOMY TUBE PLACEMENT N/A 03/27/2019   Procedure: TRACHEOSTOMY placed using [place] 8DCT Cuffed.;  Surgeon: Wonda Olds, MD;  [facility];  Service: Thoracic;  Laterality: N/A;  . TRANSURETHRAL RESECTION OF BLADDER NECK N/A 05/13/2019   Procedure: CYSTOSCOPY CLOT EVACUATION FULGRATION CYSTOGRAM AND INSTILLATION OF FORMALIN;  Surgeon: Lucas Mallow, MD;  Location: [place];  Service: Urology;  Laterality: N/A;   Social History:  reports that he quit smoking about 29 years ago. His smoking use included cigarettes. He has a 12.50 pack-year smoking history. He has never used smokeless tobacco. He reports that he does not drink alcohol and does not use drugs. Patient lives at [place] Reactions  . Atorvastatin Anaphylaxis and Rash    Leg pain  . Adhesive [Tape]     Tears skin  . Amiodarone     Rash (see 05/2019 hospital notes), but was on other medications at the time so unclear which agent   . Reglan [Metoclopramide]     Rash (see  05/2019 hospital notes), but was on other medications at the time so unclear which agent  . Heparin Rash    Thrombocytopenia/ HIT    Family History  Problem Relation Age of Onset  . CAD Mother 49  . CAD Father 59  . CAD Sister   . CAD Brother       Prior to Admission medications   Medication Sig Start Date End Date Taking? Authorizing Provider  acetaminophen (TYLENOL) 500 MG tablet Take 500 mg by mouth every 6 (six) hours as needed for moderate pain.    [provider]  aspirin 81 MG chewable tablet Chew 81 mg by mouth in the morning.  05/21/19   [provider]  b complex-vitamin c-folic acid (NEPHRO-VITE) 0.8 MG TABS tablet Take 1 tablet by mouth daily.    [provider]  Calcium Carbonate-Vitamin D 600-400 MG-UNIT tablet Take 1 tablet by mouth daily.  10/08/19 12/07/19  [provider]  carboxymethylcellulose (REFRESH PLUS) 0.5 % SOLN Place 1 drop into both eyes 4 (four) times daily. 10/08/19   [provider]  docusate sodium (COLACE) 100 MG capsule Take 1 capsule (100 mg total) by mouth 2 (two) times daily. 10/17/19   Gherghe, Vella Redhead, MD  Ensure Plus (ENSURE PLUS) LIQD Take 237 mLs by mouth 2 (two) times daily between meals.    [provider]  famotidine (PEPCID) 20 MG tablet Take 20 mg by mouth daily.     [provider]  iron sucrose (VENOFER) 20 MG/ML injection Iron Sucrose (Venofer) 11/17/19 11/15/20  [provider]  midodrine (PROAMATINE) 10 MG tablet Take 10 mg by mouth 2 (two) times daily.  10/08/19 10/07/20  [provider]  midodrine (PROAMATINE) 5 MG tablet Take Every Tuesday, Thursday, Saturday 11/07/19   Ahmed Prima, Fransisco Hertz, PA-C  Nutritional Supplements (FEEDING SUPPLEMENT, NEPRO CARB STEADY,) LIQD Take 237 mLs by mouth at bedtime.    [provider]  nystatin ointment (MYCOSTATIN) Apply 1 application topically daily as needed for itching. Apply to Penis as needed if itching occurs. 06/22/19   [provider]  polyethylene glycol (MIRALAX / GLYCOLAX) 17 g packet Take 17 g by mouth daily. 10/18/19   Caren Griffins, MD  PREDNISONE PO Take 60 mg by mouth daily.  10/09/19   [provider]  senna (SENOKOT) 8.6 MG tablet Take 1 tablet by mouth as needed for constipation.  10/08/19 10/07/20  [provider]  sulfamethoxazole-trimethoprim (BACTRIM) 400-80 MG tablet Take 1 tablet by mouth 3 (three) times a week. Monday, Wednesday, Friday 10/08/19 12/08/19  [provider]  triamcinolone cream (KENALOG) 0.1 % Apply 1 application topically 2 (two) times daily. Apply to skin that is intact. DO NOT apply to "burn wounds". 10/08/19 10/07/20  [provider]    Physical Exam: BP (!) 101/57   Pulse 78   Temp 97.8 F (36.6 C)   Resp (!) 21   Ht 5'  8" (1.727 m)   Wt 71 kg   SpO2 98%   BMI 23.80 kg/m   . General: Elderly male. Awake and alert and oriented x3. No acute cardiopulmonary distress.  Marland Kitchen HEENT: Normocephalic atraumatic.  Right and left ears normal in appearance.  Pupils equal, round, reactive to light. Extraocular muscles are intact. Sclerae anicteric and noninjected.  Moist mucosal membranes. No mucosal lesions.  . Neck: Neck supple without lymphadenopathy. No carotid bruits. No masses palpated.  . Cardiovascular: Regular rate with normal S1-S2 sounds. No murmurs, rubs, gallops  auscultated. No JVD.  Marland Kitchen Respiratory: Good respiratory effort with no wheezes, rales, rhonchi. Lungs clear to auscultation bilaterally.  No accessory muscle use. . Abdomen: Soft, nontender, nondistended. Active bowel sounds. No masses or hepatosplenomegaly  . Skin: No rashes, lesions, or ulcerations.  Dry, warm to touch. 2+ dorsalis pedis and radial pulses. . Musculoskeletal: No calf or leg pain. All major joints not erythematous nontender.  No upper or lower joint deformation.  Good ROM.  No contractures  . Psychiatric: Intact judgment and insight. Pleasant and cooperative. . Neurologic: No focal neurological deficits. Strength is 5/5 and symmetric in upper and lower extremities.  Cranial nerves II through XII are grossly intact.           Labs on Admission: I have personally reviewed following labs and imaging studies  CBC: Recent Labs  Lab 11/23/19 1403  WBC 6.2  NEUTROABS 5.0  HGB 6.6*  HCT 22.1*  MCV 106.3*  PLT 008   Basic Metabolic Panel: Recent Labs  Lab 11/23/19 1403  NA 135  K 3.5  CL 93*  CO2 28  GLUCOSE 212*  BUN 28*  CREATININE 3.04*  CALCIUM 7.6*   GFR: Estimated Creatinine Clearance: 16.3 mL/min (A) (by C-G formula based on SCr of 3.04 mg/dL (H)). Liver Function Tests: Recent Labs  Lab 11/23/19 1403  AST 22  ALT 12  ALKPHOS 51  BILITOT 0.5  PROT 4.8*  ALBUMIN 2.2*   No results for input(s): LIPASE, AMYLASE  in the last 168 hours. Recent Labs  Lab 11/23/19 1403  AMMONIA 44*   Coagulation Profile: Recent Labs  Lab 11/23/19 1403  INR 1.1   Cardiac Enzymes: No results for input(s): CKTOTAL, CKMB, CKMBINDEX, TROPONINI in the last 168 hours. BNP (last 3 results) No results for input(s): PROBNP in the last 8760 hours. HbA1C: No results for input(s): HGBA1C in the last 72 hours. CBG: No results for input(s): GLUCAP in the last 168 hours. Lipid Profile: No results for input(s): CHOL, HDL, LDLCALC, TRIG, CHOLHDL, LDLDIRECT in the last 72 hours. Thyroid Function Tests: No results for input(s): TSH, T4TOTAL, FREET4, T3FREE, THYROIDAB in the last 72 hours. Anemia Panel: No results for input(s): VITAMINB12, FOLATE, FERRITIN, TIBC, IRON, RETICCTPCT in the last 72 hours. Urine analysis:    Component Value Date/Time   COLORURINE AMBER (A) 06/22/2019 1830   APPEARANCEUR CLOUDY (A) 06/22/2019 1830   LABSPEC 1.020 06/22/2019 1830   PHURINE 6.5 06/22/2019 1830   GLUCOSEU 100 (A) 06/22/2019 1830   HGBUR LARGE (A) 06/22/2019 1830   BILIRUBINUR MODERATE (A) 06/22/2019 1830   KETONESUR TRACE (A) 06/22/2019 1830   PROTEINUR >300 (A) 06/22/2019 1830   NITRITE POSITIVE (A) 06/22/2019 1830   LEUKOCYTESUR LARGE (A) 06/22/2019 1830   Sepsis Labs: @LABRCNTIP (procalcitonin:4,lacticidven:4) ) Recent Results (from the past 240 hour(s))  Respiratory Panel by RT PCR (Flu A&B, Covid) - Nasopharyngeal Swab     Status: None   Collection Time: 11/23/19  1:34 PM   Specimen: Nasopharyngeal Swab  Result Value Ref Range Status   SARS Coronavirus 2 by RT PCR NEGATIVE NEGATIVE Final    Comment: (NOTE) SARS-CoV-2 target nucleic acids are NOT DETECTED.  The SARS-CoV-2 RNA is generally detectable in upper respiratoy specimens during the acute phase of infection. The lowest concentration of SARS-CoV-2 viral copies this assay can detect is 131 copies/mL. A negative result does not preclude SARS-Cov-2 infection and  should not be used as the sole basis for treatment or other patient management decisions. A negative result  may occur with  improper specimen collection/handling, submission of specimen other than nasopharyngeal swab, presence of viral mutation(s) within the areas targeted by this assay, and inadequate number of viral copies (<131 copies/mL). A negative result must be combined with clinical observations, patient history, and epidemiological information. The expected result is Negative.  Fact Sheet for Patients:  PinkCheek.be  Fact Sheet for Healthcare Providers:  GravelBags.it  This test is no t yet approved or cleared by the [place] FDA and  has been authorized for detection and/or diagnosis of SARS-CoV-2 by FDA under an Emergency Use Authorization (EUA). This EUA will remain  in effect (meaning this test can be used) for the duration of the COVID-19 declaration under Section 564(b)(1) of the Act, 21 U.S.C. section 360bbb-3(b)(1), unless the authorization is terminated or revoked sooner.     Influenza A by PCR NEGATIVE NEGATIVE Final   Influenza B by PCR NEGATIVE NEGATIVE Final    Comment: (NOTE) The Xpert Xpress SARS-CoV-2/FLU/RSV assay is intended as an aid in  the diagnosis of influenza from Nasopharyngeal swab specimens and  should not be used as a sole basis for treatment. Nasal washings and  aspirates are unacceptable for Xpert Xpress SARS-CoV-2/FLU/RSV  testing.  Fact Sheet for Patients: PinkCheek.be  Fact Sheet for Healthcare Providers: GravelBags.it  This test is not yet approved or cleared by the [place] FDA and  has been authorized for detection and/or diagnosis of SARS-CoV-2 by  FDA under an Emergency Use Authorization (EUA). This EUA will remain  in effect (meaning this test can be used) for the duration of the  Covid-19 declaration  under Section 564(b)(1) of the Act, 21  U.S.C. section 360bbb-3(b)(1), unless the authorization is  terminated or revoked. Performed at [facility], [address], [place], [place] [address]      Radiological Exams on Admission: No results found.  EKG: Independently reviewed.  Sinus tachycardia with PVCs, incomplete left bundle branch block.  LVH.  No acute ST changes.  Assessment/Plan: Active Problems:   PAF (paroxysmal atrial fibrillation) (HCC)   GERD (gastroesophageal reflux disease)   Chronic diastolic heart failure (HCC)   Hypertension   Coronary artery disease due to lipid rich plaque   S/P CABG x 3   ESRD on dialysis (HCC)   History of atrial fibrillation- treated surgically, no longer has it per the patient   Pemphigus vulgaris   Acute blood loss anemia    This patient was discussed with the ED physician, including pertinent vitals, physical exam findings, labs, and imaging.  We also discussed care given by the ED provider.  1. Acute blood loss anemia with lower GI bleed a. Admit b. N.p.o. c. Type and transfuse 2 units of blood d. CBC later on this evening and tomorrow morning e. Appreciate GI consult 2. Pemphigus vulgaris a. Patient has been on Bactrim 3 times weekly and prednisone b. Patient on certain of where he is added in this regiment c. We will consult with [facility] and see if patient needs to continue treatment 3. End-stage renal disease on dialysis a. Consult nephrology 4. History of atrial fibrillation a. Not currently on anticoagulation 5. Coronary artery disease status post three-vessel CABG with hypertension and combined chronic diastolic and systolic heart failure a. Hold aspirin b. Continue antihypertensives 6. GERD  DVT prophylaxis: SCDs Consultants: GI Code Status: DNR confirmed by patient Family Communication: None Disposition Plan: Return to [facility] following admission   Truett Mainland, DO

## 2019-11-23 NOTE — ED Provider Notes (Signed)
[facility] EMERGENCY DEPARTMENT Provider Note   CSN: [id number] Arrival date & time: 11/23/19  1247     History Chief Complaint  Patient presents with  . Blood In Stools    Jesse Murphy is a 86 y.o. male with an extensive list of chronic illness and a pmh of CAD, combined heart failure, history of complete heart block, end-stage renal disease, hypertension, paroxysmal atrial fibrillation, pemphigus vulgaris, history of prostate cancer of SJS, and type 2 diabetes who presents the emergency department for weakness and bloody stool.  Patient states that he has a history of previous GI bleed requiring transfusion and admission to the hospital.  He states that yesterday when he went to the bathroom he noticed that his stool was dark and bloody, maroon in color.  This morning when he awoke he states that he felt "really bad."  He states that he was too weak to stand up and got extremely lightheaded.  Normally he is able to ambulate with a walker.  Patient states that he remained in bed all day and was sent to the emergency department for further evaluation.  He is on a blood thinner but states he does not know which one.  He denies abdominal pain.  HPI     Past Medical History:  Diagnosis Date  . Anemia   . CAD (coronary artery disease)    a. s/p PCI in 1992 b. Coronary CT in 01/2018 showing extensive coronary calcification; FFR indeterminate --> medical management pursued at that time as patient not interested in repeat cath. c. s/p CABGx3 in 02/2019.  . Cancer [facility])    prostate  . Cardiogenic shock ([place])   . CHB (complete heart block) (HCC)    a. due to vagal event in 2021, requiring temp pacer.  . Chronic combined systolic and diastolic CHF (congestive heart failure) ([place])   . Debilitated   . Decubitus ulcer of coccyx, unspecified pressure ulcer stage   . Dyspnea   . End stage renal disease ([place])   . Esophagitis   . H/O tracheostomy    a. during admit in 2021 for respiratory  failure.  . Hematuria   . Hypertension   . Hypoalbuminemia   . Ischemic cardiomyopathy   . Myocardial infarction ([employer])   . PAF (paroxysmal atrial fibrillation) ([place])   . 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 Pleural effusion, left    a. s/p pleurX catheter  . Pressure ulcer of both heels   . Pseudomonas pneumonia ([place])   . Thrombocytopenia ([place])   . Walker as ambulation aid    also uses wheelchair    Patient Active Problem List   Diagnosis Date Noted  . Adverse effect of antihyperlipidemic and antiarteriosclerotic drugs, initial encounter 11/07/2019  . Pure hypercholesterolemia 11/07/2019  . Skin desquamation 11/07/2019  . Gastrointestinal hemorrhage, unspecified 10/18/2019  . Advanced care planning/counseling discussion   . End stage renal disease on dialysis ([place])   . Palliative care by specialist   . Goals of care, counseling/discussion   . Rectal bleeding 10/13/2019  . Inguinal hernia of left side without obstruction or gangrene   . 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 History of atrial fibrillation- treated surgically, no longer has it per the patient 10/01/2019  . Personal history of prostate cancer 10/01/2019  . Stevens-Johnson syndrome ([place]) 10/01/2019  . Abnormality of gait 09/24/2019  . Hypothyroidism, unspecified 09/19/2019  . Anaphylactic shock, unspecified,  initial encounter 09/05/2019  . History of pleural effusion 08/22/2019  . Recurrent left pleural effusion 07/11/2019  . Hematuria 07/09/2019  . Postop check 06/27/2019  . Unspecified protein-calorie malnutrition ([place]) 06/22/2019  . Coagulation defect, unspecified ([place]) 06/19/2019  . AKI (acute kidney injury) ([place]) 06/18/2019  . Anemia in chronic kidney disease 06/14/2019  . Cardiogenic shock ([place]) 06/14/2019  . Coronary angioplasty status 06/14/2019  . Diarrhea, unspecified 06/14/2019  . Iron deficiency anemia,  unspecified 06/14/2019  . Pain, unspecified 06/14/2019  . Pruritus, unspecified 06/14/2019  . Secondary hyperparathyroidism of renal origin ([place]) 06/14/2019  . Adult failure to thrive   . History of non-Hodgkin's lymphoma   . Chest tube in place   . Failure to thrive (child)   . Prediabetes   . Hypoalbuminemia due to protein-calorie malnutrition ([place])   . Failure to thrive in adult   . Allergic contact dermatitis   . ESRD on dialysis ([place])   . Dysphagia   . Thrombocytopenia ([place])   . Acute on chronic anemia   . Supplemental oxygen dependent   . Acute systolic congestive heart failure ([place])   . Pressure ulcer 05/23/2019  . Debility 05/22/2019  . Palliative care encounter   . Pressure injury of skin 03/25/2019  . Acute respiratory failure with hypoxia ([place])   . S/P CABG x 3 03/16/2019  . Non-ST elevation (NSTEMI) myocardial infarction ([place])   . Acute on chronic combined systolic and diastolic CHF (congestive heart failure) ([place])   . Coronary artery disease due to lipid rich plaque   . Atrial fibrillation with RVR ([place]) 03/14/2019  . Atrial fibrillation with rapid ventricular response ([place]) 03/13/2019  . Chest pain 09/12/2018  . Chronic diastolic heart failure ([place]) 05/29/2018  . Hypertension 05/29/2018  . Non Hodgkin's lymphoma ([place]) 09/30/2017  . Heme positive stool 08/11/2017  . Chronic anticoagulation 07/05/2016  . CAD S/P percutaneous coronary angioplasty 07/05/2016  . Type 2 diabetes mellitus without complications ([place]) 26/83/4196  . PAF (paroxysmal atrial fibrillation) ([place]) 06/14/2016  . Chest pain in adult 06/14/2016  . Dyspnea on exertion 06/14/2016  . Allergic rhinitis 06/14/2016  . GERD (gastroesophageal reflux disease) 06/14/2016  . BPH (benign prostatic hyperplasia) 06/14/2016    Past Surgical History:  Procedure Laterality Date  . BASCILIC VEIN TRANSPOSITION Right 09/10/2019   Procedure: RIGHT ARM 1ST STAGE BASCILIC VEIN TRANSPOSITION;  Surgeon: Angelia Mould, MD;  Location: [place];  Service: Vascular;  Laterality: Right;  . BASCILIC VEIN TRANSPOSITION Right 11/06/2019   Procedure: RIGHT UPPER EXTREMITY SECOND STAGE BASCILIC VEIN TRANSPOSITION;  Surgeon: Angelia Mould, MD;  Location: [place];  Service: Vascular;  Laterality: Right;  . BIOPSY  10/15/2019   Procedure: BIOPSY;  Surgeon: Daneil Dolin, MD;  [facility];  Service: Endoscopy;;  Distal Rectal Ulceration biopsies   . CHEST TUBE INSERTION Left 04/23/2019   Procedure: INSERTION PLEURAL DRAINAGE CATHETER TO DRAIN LEFT PLEURAL EFFUSION;  Surgeon: Ivin Poot, MD;  Location: [place];  Service: Thoracic;  Laterality: Left;  . CLIPPING OF ATRIAL APPENDAGE N/A 03/16/2019   Procedure: CLIPPING OF LEFT  ATRIAL APPENDAGE - USING ATRICLIP SIZE 40;  Surgeon: Ivin Poot, MD;  Location: [place];  Service: Open Heart Surgery;  Laterality: N/A;  . COLONOSCOPY N/A 08/25/2017   Procedure: COLONOSCOPY;  Surgeon: Rogene Houston, MD;  [facility];  Service: Endoscopy;  Laterality: N/A;  . CORONARY ARTERY BYPASS GRAFT N/A 03/16/2019   Procedure: CORONARY ARTERY BYPASS GRAFTING (  CABG), ON PUMP, TIMES THREE, USING LEFT INTERNAL MAMMARY ARTERY, RIGHT GREATER SAPHENOUS VEIN HARVESTED ENDOSCOPICALLY;  Surgeon: Ivin Poot, MD;  Location: [place];  Service: Open Heart Surgery;  Laterality: N/A;  swan only  . CYSTOSCOPY WITH FULGERATION N/A 04/30/2019   Procedure: CYSTOSCOPY WITH FULGERATION;  Surgeon: Irine Seal, MD;  Location: [place];  Service: Urology;  Laterality: N/A;  . FLEXIBLE SIGMOIDOSCOPY N/A 10/15/2019   Procedure: FLEXIBLE SIGMOIDOSCOPY with propofol;  Surgeon: Daneil Dolin, MD;  [facility];  Service: Endoscopy;  Laterality: N/A;  . HERNIA REPAIR    . IR FLUORO GUIDE CV LINE LEFT  04/23/2019  . IR FLUORO GUIDE CV LINE RIGHT  05/24/2019  . IR US GUIDE VASC ACCESS LEFT  04/23/2019  . MAZE N/A 03/16/2019   Procedure: MAZE;  Surgeon: Ivin Poot, MD;   Location: [place];  Service: Open Heart Surgery;  Laterality: N/A;  . PLACEMENT OF IMPELLA LEFT VENTRICULAR ASSIST DEVICE Right 03/27/2019   Procedure: Placement Of Impella Left Ventricular Assist Device using ABIOMED Impella 5.5 with SmartAssist Device.;  Surgeon: Wonda Olds, MD;  [facility];  Service: Thoracic;  Laterality: Right;  . PROSTATE SURGERY    . REMOVAL OF IMPELLA LEFT VENTRICULAR ASSIST DEVICE N/A 04/10/2019   Procedure: REMOVAL OF IMPELLA LEFT VENTRICULAR ASSIST DEVICE WITH INSERTION OF RIGHT FEMORAL ARTERIAL LINE;  Surgeon: Ivin Poot, MD;  Location: [place];  Service: Open Heart Surgery;  Laterality: N/A;  . REMOVAL OF PLEURAL DRAINAGE CATHETER Left 07/02/2019   Procedure: REMOVAL OF PLEURAL DRAINAGE CATHETER;  Surgeon: Ivin Poot, MD;  Location: [place];  Service: Thoracic;  Laterality: Left;  . RIGHT/LEFT HEART CATH AND CORONARY ANGIOGRAPHY N/A 03/15/2019   Procedure: RIGHT/LEFT HEART CATH AND CORONARY ANGIOGRAPHY;  Surgeon: Burnell Blanks, MD;  Location: [place] CV LAB;  Service: Cardiovascular;  Laterality: N/A;  . TEE WITHOUT CARDIOVERSION N/A 03/16/2019   Procedure: TRANSESOPHAGEAL ECHOCARDIOGRAM (TEE);  Surgeon: Prescott Gum, Collier Salina, MD;  Location: [place];  Service: Open Heart Surgery;  Laterality: N/A;  . TRACHEOSTOMY TUBE PLACEMENT N/A 03/27/2019   Procedure: TRACHEOSTOMY placed using [place]iley 8DCT Cuffed.;  Surgeon: Wonda Olds, MD;  [facility];  Service: Thoracic;  Laterality: N/A;  . TRANSURETHRAL RESECTION OF BLADDER NECK N/A 05/13/2019   Procedure: CYSTOSCOPY CLOT EVACUATION FULGRATION CYSTOGRAM AND INSTILLATION OF FORMALIN;  Surgeon: Lucas Mallow, MD;  Location: [place];  Service: Urology;  Laterality: N/A;       Family History  Problem Relation Age of Onset  . CAD Mother 4  . CAD Father 27  . CAD Sister   . CAD Brother     Social History   Tobacco Use  . Smoking status: Former Smoker    Packs/day: 0.25    Years: 50.00     Pack years: 12.50    Types: Cigarettes    Quit date: 02/15/1990    Years since quitting: 29.7  . Smokeless tobacco: Never Used  Vaping Use  . Vaping Use: Never used  Substance Use Topics  . Alcohol use: No  . Drug use: No    Home Medications Prior to Admission medications   Medication Sig Start Date End Date Taking? Authorizing Provider  acetaminophen (TYLENOL) 500 MG tablet Take 500 mg by mouth every 6 (six) hours as needed for moderate pain.    [provider]  aspirin 81 MG chewable tablet Chew 81 mg by mouth in the morning.  05/21/19   [provider]  b complex-vitamin c-folic acid (NEPHRO-VITE) 0.8 MG TABS tablet Take 1 tablet by mouth daily.    [provider]  Calcium Carbonate-Vitamin D 600-400 MG-UNIT tablet Take 1 tablet by mouth daily. 10/08/19 12/07/19  [provider]  carboxymethylcellulose (REFRESH PLUS) 0.5 % SOLN Place 1 drop into both eyes 4 (four) times daily. 10/08/19   [provider]  docusate sodium (COLACE) 100 MG capsule Take 1 capsule (100 mg total) by mouth 2 (two) times daily. 10/17/19   Gherghe, Vella Redhead, MD  Ensure Plus (ENSURE PLUS) LIQD Take 237 mLs by mouth 2 (two) times daily between meals.    [provider]  famotidine (PEPCID) 20 MG tablet Take 20 mg by mouth daily.     [provider]  iron sucrose (VENOFER) 20 MG/ML injection Iron Sucrose (Venofer) 11/17/19 11/15/20  [provider]  midodrine (PROAMATINE) 10 MG tablet Take 10 mg by mouth 2 (two) times daily.  10/08/19 10/07/20  [provider]  midodrine (PROAMATINE) 5 MG tablet Take Every Tuesday, Thursday, Saturday 11/07/19   Ahmed Prima, Fransisco Hertz, PA-C  Nutritional Supplements (FEEDING SUPPLEMENT, NEPRO CARB STEADY,) LIQD Take 237 mLs by mouth at bedtime.    [provider]  nystatin ointment (MYCOSTATIN) Apply 1 application topically daily as needed for itching. Apply to Penis as needed if itching occurs. 06/22/19    [provider]  polyethylene glycol (MIRALAX / GLYCOLAX) 17 g packet Take 17 g by mouth daily. 10/18/19   Caren Griffins, MD  PREDNISONE PO Take 60 mg by mouth daily.  10/09/19   [provider]  senna (SENOKOT) 8.6 MG tablet Take 1 tablet by mouth as needed for constipation.  10/08/19 10/07/20  [provider]  sulfamethoxazole-trimethoprim (BACTRIM) 400-80 MG tablet Take 1 tablet by mouth 3 (three) times a week. Monday, Wednesday, Friday 10/08/19 12/08/19  [provider]  triamcinolone cream (KENALOG) 0.1 % Apply 1 application topically 2 (two) times daily. Apply to skin that is intact. DO NOT apply to "burn wounds". 10/08/19 10/07/20  [provider]    Allergies    Atorvastatin, Adhesive [tape], Amiodarone, Reglan [metoclopramide], and Heparin  Review of Systems   Review of Systems Ten systems reviewed and are negative for acute change, except as noted in the HPI.   Physical Exam Updated Vital Signs BP (!) 104/44   Pulse 99   Temp 97.8 F (36.6 C)   Resp 17   Ht 5\' 8"  (1.727 m)   Wt 71 kg   BMI 23.80 kg/m   Physical Exam Vitals and nursing note reviewed.  Constitutional:      General: He is not in acute distress.    Appearance: He is well-developed. He is not diaphoretic.     Comments: Very pale in appearance  HENT:     Head: Normocephalic and atraumatic.  Eyes:     General: No scleral icterus.    Conjunctiva/sclera: Conjunctivae normal.  Cardiovascular:     Rate and Rhythm: Normal rate and regular rhythm.     Heart sounds: Normal heart sounds.  Pulmonary:     Effort: Pulmonary effort is normal. No respiratory distress.     Breath sounds: Normal breath sounds.  Abdominal:     General: There is no distension.     Palpations: Abdomen is soft.     Tenderness: There is no abdominal tenderness.  Genitourinary:    Comments: Red blood noted on adult diaper and around  the anal sphincter. Digital Rectal Exam reveals sphincter with  good tone. No external hemorrhoids. No masses or fissures. There is only dark blood noted on the examing finger, no stool. Hemoccult positive.  Musculoskeletal:     Cervical back: Normal range of motion and neck supple.  Skin:    General: Skin is warm and dry.  Neurological:     Mental Status: He is alert.  Psychiatric:        Behavior: Behavior normal.     ED Results / Procedures / Treatments   Labs (all labs ordered are listed, but only abnormal results are displayed) Labs Reviewed  COMPREHENSIVE METABOLIC PANEL  CBC WITH DIFFERENTIAL/PLATELET  PROTIME-INR  AMMONIA  LACTIC ACID, PLASMA  POC OCCULT BLOOD, ED  TYPE AND SCREEN    EKG None  Radiology No results found.  Procedures .Critical Care Performed by: Margarita Mail, PA-C Authorized by: Margarita Mail, PA-C   Critical care provider statement:    Critical care time (minutes):  35   Critical care time was exclusive of:  Separately billable procedures and treating other patients   Critical care was necessary to treat or prevent imminent or life-threatening deterioration of the following conditions:  Circulatory failure   Critical care was time spent personally by me on the following activities:  Discussions with consultants, evaluation of patient's response to treatment, examination of patient, ordering and performing treatments and interventions, ordering and review of laboratory studies, ordering and review of radiographic studies, pulse oximetry, re-evaluation of patient's condition, obtaining history from patient or surrogate and review of old charts   (including critical care time)  Medications Ordered in ED Medications  pantoprazole (PROTONIX) 80 mg in sodium chloride 0.9 % 100 mL IVPB (has no administration in time range)  pantoprazole (PROTONIX) 80 mg in sodium chloride 0.9 % 100 mL (0.8 mg/mL) infusion (has no administration in time range)    ED Course  I have reviewed the triage vital signs and the  nursing notes.  Pertinent labs & imaging results that were available during my care of the patient were reviewed by me and considered in my medical decision making (see chart for details).  Clinical Course as of Nov 22 1604  Fri Nov 23, 2019  1511 BUN(!): 28 [AH]  1512 Hemoglobin(!!): 6.6 [AH]    Clinical Course User Index [AH] Margarita Mail, PA-C   MDM Rules/Calculators/A&P                          [id number] VS:  Vitals:   11/23/19 1300 11/23/19 1415 11/23/19 1500 11/23/19 1530  BP: (!) 103/43 (!) 103/43 (!) 133/35 (!) 101/57  Pulse: 94 (!) 108 73 78  Resp:  15 19 (!) 21  Temp:      SpO2: 98% 96% 96% 98%  Weight:      Height:        HE:RDEYCXK is gathered by patient  and emr. Previous records obtained and reviewed. DDX:The patient's complaint of weakness involves an extensive number of diagnostic and treatment options, and is a complaint that carries with it a high risk of complications, morbidity, and potential mortality. Given the large differential diagnosis, medical decision making is of high complexity. The differential diagnosis of weakness includes but is not limited to neurologic causes (GBS, myasthenia gravis, CVA, MS, ALS, transverse myelitis, spinal cord injury, CVA, botulism, ) and other causes: ACS, Arrhythmia, syncope, orthostatic hypotension, sepsis, hypoglycemia, electrolyte disturbance, hypothyroidism, respiratory failure, symptomatic anemia, dehydration,  heat injury, polypharmacy, malignancy. Labs: I ordered reviewed and interpreted labs which include CBC which shows hemoglobin of 6.6 down from baseline of about 9.8, CMP shows elevated BUN at 28 creatinine of 3.04 consistent with end-stage renal disease.  Lactic acidosis consistent with active GI bleed.  Covid testing is negative. Imaging: EKG: EKG shows sinus rhythm at a rate of 92 with multiform ventricular complex ease Consults: Dr. Gordy Levan of GI service and Dr. Sheran Lawless of [facility] hospital[facility] who admit  the patient MDM: Patient with active GI bleed.  Given 2 units of blood here in the emergency department.  Patient placed on Protonix bolus and drip. Patient disposition:The patient appears reasonably stabilized for admission considering the current resources, flow, and capabilities available in the ED at this time, and I doubt any other [facility] requiring further screening and/or treatment in the ED prior to admission.        Final Clinical Impression(s) / ED Diagnoses Final diagnoses:  None    Rx / DC Orders ED Discharge Orders    None       Margarita Mail, PA-C 11/23/19 1608    Milton Ferguson, MD 11/24/19 1658

## 2019-11-23 NOTE — Consult Note (Addendum)
Referring Provider: ED provider Primary Care Physician:  Leeanne Rio, MD Primary Gastroenterologist:  Dr. Laural Golden  Date of Admission: 11/23/19 Date of Consultation: 11/23/19  Reason for Consultation:  GI bleed  HPI:  Jesse Murphy is a 86 y.o. male with a past medical history of anemia, CAD status post PCI in January 2021 most recently, prostate cancer, complete heart block, CHF, end-stage renal disease, hypertension, paroxysmal A. fib, thrombocytopenia.  Patient presented to the emergency department for weakness and bloody stool with a history of previous GI bleed requiring transfusion and hospitalization.  He had dark, bloody, maroon-colored stools yesterday.  He woke this morning feeling weak and lightheaded with difficulty ambulating.  He remained in bed all day and came to the ER for further evaluation.  Notes he is on a blood thinner but cannot remember which one.  No abdominal pain.  In the ER point-of-care Hemoccult blood was positive, SARS-CoV-2/respiratory panel pending, lactic acid elevated at 4.3, metabolic panel with elevated BUN at 28 (was 22 2 weeks ago) with stable creatinine at 3.04 (3.102 weeks ago).  LFTs normal.  Baseline hemoglobin appears to be in the 7-9 range and was 9.22 weeks ago.  However, today his hemoglobin is down to 6.6.  No leukocytosis.  Platelet count 192.  INR normal at 1.1.  Ammonia very mildly elevated at 44.  The patient most recently had a full colonoscopy 08/25/2017 for heme positive stool.  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  Hemospray was used to control active bleeding.  At that time the patient was on Xarelto and recommended resuming Xarelto after 3 days and repeat flexible sigmoidoscopy if recurrent bleeding.  Flexible sigmoidoscopy was completed 10/15/2019 presumably during hospitalization for hematochezia and  constipation.  Flexible sigmoidoscopy found extensively ulcerated distal rectal mucosa status post biopsy with inflammation abutting the anal verge.  Possibilities include solitary rectal ulcer syndrome although stercoral component not excluded.  Also possible for ongoing severe desquamating skin disease as a contributor.  Less likely ischemia or infectious process.  Ulceration not consistent with radiation proctitis.  Recommended continue Colace and MiraLAX, continue high-dose oral corticosteroid therapy, advanced to soft diet.  Surgical pathology found the biopsies to be ulcerated colonic mucosa with hyperplastic changes and detached necroinflammatory debris without malignancy.  Today he states he's doing ok overall. Has had some mild bleeding over the past few days. Yesterday/early this morning he did have more significant bleeding. Denies abdominal pain, N/V, fever, chills. His blood was dark red. He has been more constipated lately but was given a suppository at the nursing home. No other GI complaints.  Past Medical History:  Diagnosis Date  . Anemia   . CAD (coronary artery disease)    a. s/p PCI in 1992 b. Coronary CT in 01/2018 showing extensive coronary calcification; FFR indeterminate --> medical management pursued at that time as patient not interested in repeat cath. c. s/p CABGx3 in 02/2019.  . Cancer [facility])    prostate  . Cardiogenic shock ([place])   . CHB (complete heart block) (HCC)    a. due to vagal event in 2021, requiring temp pacer.  . Chronic combined systolic and diastolic CHF (congestive heart failure) ([place])   . Debilitated   . Decubitus ulcer of coccyx, unspecified pressure ulcer stage   . Dyspnea   . End stage renal disease ([place])   . Esophagitis   . H/O tracheostomy  a. during admit in 2021 for respiratory failure.  . Hematuria   . Hypertension   . Hypoalbuminemia   . Ischemic cardiomyopathy   . Myocardial infarction ([place])   . PAF (paroxysmal atrial fibrillation)  ([place])   . 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 Pleural effusion, left    a. s/p pleurX catheter  . Pressure ulcer of both heels   . Pseudomonas pneumonia ([place])   . Thrombocytopenia ([place])   . Walker as ambulation aid    also uses wheelchair    Past Surgical History:  Procedure Laterality Date  . BASCILIC VEIN TRANSPOSITION Right 09/10/2019   Procedure: RIGHT ARM 1ST STAGE BASCILIC VEIN TRANSPOSITION;  Surgeon: Angelia Mould, MD;  Location: [place];  Service: Vascular;  Laterality: Right;  . BASCILIC VEIN TRANSPOSITION Right 11/06/2019   Procedure: RIGHT UPPER EXTREMITY SECOND STAGE BASCILIC VEIN TRANSPOSITION;  Surgeon: Angelia Mould, MD;  Location: [place];  Service: Vascular;  Laterality: Right;  . BIOPSY  10/15/2019   Procedure: BIOPSY;  Surgeon: Daneil Dolin, MD;  [facility];  Service: Endoscopy;;  Distal Rectal Ulceration biopsies   . CHEST TUBE INSERTION Left 04/23/2019   Procedure: INSERTION PLEURAL DRAINAGE CATHETER TO DRAIN LEFT PLEURAL EFFUSION;  Surgeon: Ivin Poot, MD;  Location: [place];  Service: Thoracic;  Laterality: Left;  . CLIPPING OF ATRIAL APPENDAGE N/A 03/16/2019   Procedure: CLIPPING OF LEFT  ATRIAL APPENDAGE - USING ATRICLIP SIZE 40;  Surgeon: Ivin Poot, MD;  Location: [place];  Service: Open Heart Surgery;  Laterality: N/A;  . COLONOSCOPY N/A 08/25/2017   Procedure: COLONOSCOPY;  Surgeon: Rogene Houston, MD;  [facility];  Service: Endoscopy;  Laterality: N/A;  . CORONARY ARTERY BYPASS GRAFT N/A 03/16/2019   Procedure: CORONARY ARTERY BYPASS GRAFTING (CABG), ON PUMP, TIMES THREE, USING LEFT INTERNAL MAMMARY ARTERY, RIGHT GREATER SAPHENOUS VEIN HARVESTED ENDOSCOPICALLY;  Surgeon: Ivin Poot, MD;  Location: [place];  Service: Open Heart Surgery;  Laterality: N/A;  swan only  . CYSTOSCOPY WITH FULGERATION N/A 04/30/2019   Procedure: CYSTOSCOPY WITH FULGERATION;  Surgeon: Irine Seal, MD;  Location: [place];  Service: Urology;   Laterality: N/A;  . FLEXIBLE SIGMOIDOSCOPY N/A 10/15/2019   Procedure: FLEXIBLE SIGMOIDOSCOPY with propofol;  Surgeon: Daneil Dolin, MD;  [facility];  Service: Endoscopy;  Laterality: N/A;  . HERNIA REPAIR    . IR FLUORO GUIDE CV LINE LEFT  04/23/2019  . IR FLUORO GUIDE CV LINE RIGHT  05/24/2019  . IR US GUIDE VASC ACCESS LEFT  04/23/2019  . MAZE N/A 03/16/2019   Procedure: MAZE;  Surgeon: Ivin Poot, MD;  Location: [place];  Service: Open Heart Surgery;  Laterality: N/A;  . PLACEMENT OF IMPELLA LEFT VENTRICULAR ASSIST DEVICE Right 03/27/2019   Procedure: Placement Of Impella Left Ventricular Assist Device using ABIOMED Impella 5.5 with SmartAssist Device.;  Surgeon: Wonda Olds, MD;  [facility];  Service: Thoracic;  Laterality: Right;  . PROSTATE SURGERY    . REMOVAL OF IMPELLA LEFT VENTRICULAR ASSIST DEVICE N/A 04/10/2019   Procedure: REMOVAL OF IMPELLA LEFT VENTRICULAR ASSIST DEVICE WITH INSERTION OF RIGHT FEMORAL ARTERIAL LINE;  Surgeon: Ivin Poot, MD;  Location: [place];  Service: Open Heart Surgery;  Laterality: N/A;  . REMOVAL OF PLEURAL DRAINAGE CATHETER Left 07/02/2019   Procedure: REMOVAL OF PLEURAL DRAINAGE CATHETER;  Surgeon: Ivin Poot, MD;  Location: [place];  Service: Thoracic;  Laterality: Left;  .  RIGHT/LEFT HEART CATH AND CORONARY ANGIOGRAPHY N/A 03/15/2019   Procedure: RIGHT/LEFT HEART CATH AND CORONARY ANGIOGRAPHY;  Surgeon: Burnell Blanks, MD;  Location: [place] [facility];  Service: Cardiovascular;  Laterality: N/A;  . TEE WITHOUT CARDIOVERSION N/A 03/16/2019   Procedure: TRANSESOPHAGEAL ECHOCARDIOGRAM (TEE);  Surgeon: Prescott Gum, Collier Salina, MD;  Location: [place];  Service: Open Heart Surgery;  Laterality: N/A;  . TRACHEOSTOMY TUBE PLACEMENT N/A 03/27/2019   Procedure: TRACHEOSTOMY placed using [place]iley 8DCT Cuffed.;  Surgeon: Wonda Olds, MD;  [facility];  Service: Thoracic;  Laterality: N/A;  . TRANSURETHRAL RESECTION OF BLADDER NECK  N/A 05/13/2019   Procedure: CYSTOSCOPY CLOT EVACUATION FULGRATION CYSTOGRAM AND INSTILLATION OF FORMALIN;  Surgeon: Lucas Mallow, MD;  Location: [place];  Service: Urology;  Laterality: N/A;    Prior to Admission medications   Medication Sig Start Date End Date Taking? Authorizing Provider  acetaminophen (TYLENOL) 500 MG tablet Take 500 mg by mouth every 6 (six) hours as needed for moderate pain.    [provider]  aspirin 81 MG chewable tablet Chew 81 mg by mouth in the morning.  05/21/19   [provider]  b complex-vitamin c-folic acid (NEPHRO-VITE) 0.8 MG TABS tablet Take 1 tablet by mouth daily.    [provider]  Calcium Carbonate-Vitamin D 600-400 MG-UNIT tablet Take 1 tablet by mouth daily. 10/08/19 12/07/19  [provider]  carboxymethylcellulose (REFRESH PLUS) 0.5 % SOLN Place 1 drop into both eyes 4 (four) times daily. 10/08/19   [provider]  docusate sodium (COLACE) 100 MG capsule Take 1 capsule (100 mg total) by mouth 2 (two) times daily. 10/17/19   Gherghe, Vella Redhead, MD  Ensure Plus (ENSURE PLUS) LIQD Take 237 mLs by mouth 2 (two) times daily between meals.    [provider]  famotidine (PEPCID) 20 MG tablet Take 20 mg by mouth daily.     [provider]  iron sucrose (VENOFER) 20 MG/ML injection Iron Sucrose (Venofer) 11/17/19 11/15/20  [provider]  midodrine (PROAMATINE) 10 MG tablet Take 10 mg by mouth 2 (two) times daily.  10/08/19 10/07/20  [provider]  midodrine (PROAMATINE) 5 MG tablet Take Every Tuesday, Thursday, Saturday 11/07/19   Ahmed Prima, Fransisco Hertz, PA-C  Nutritional Supplements (FEEDING SUPPLEMENT, NEPRO CARB STEADY,) LIQD Take 237 mLs by mouth at bedtime.    [provider]  nystatin ointment (MYCOSTATIN) Apply 1 application topically daily as needed for itching. Apply to Penis as needed if itching occurs. 06/22/19   [provider]  polyethylene glycol (MIRALAX /  GLYCOLAX) 17 g packet Take 17 g by mouth daily. 10/18/19   Caren Griffins, MD  PREDNISONE PO Take 60 mg by mouth daily.  10/09/19   [provider]  senna (SENOKOT) 8.6 MG tablet Take 1 tablet by mouth as needed for constipation.  10/08/19 10/07/20  [provider]  sulfamethoxazole-trimethoprim (BACTRIM) 400-80 MG tablet Take 1 tablet by mouth 3 (three) times a week. Monday, Wednesday, Friday 10/08/19 12/08/19  [provider]  triamcinolone cream (KENALOG) 0.1 % Apply 1 application topically 2 (two) times daily. Apply to skin that is intact. DO NOT apply to "burn wounds". 10/08/19 10/07/20  [provider]    Current Facility-Administered Medications  Medication Dose Route Frequency Provider Last Rate Last Admin  . 0.9 %  sodium chloride infusion  10 mL/hr Intravenous Once Milton Ferguson, MD      . pantoprazole (PROTONIX) 80 mg in  sodium chloride 0.9 % 100 mL (0.8 mg/mL) infusion  8 mg/hr Intravenous Continuous Margarita Mail, PA-C       Current Outpatient Medications  Medication Sig Dispense Refill  . acetaminophen (TYLENOL) 500 MG tablet Take 500 mg by mouth every 6 (six) hours as needed for moderate pain.    Marland Kitchen aspirin 81 MG chewable tablet Chew 81 mg by mouth in the morning.     Marland Kitchen b complex-vitamin c-folic acid (NEPHRO-VITE) 0.8 MG TABS tablet Take 1 tablet by mouth daily.    . Calcium Carbonate-Vitamin D 600-400 MG-UNIT tablet Take 1 tablet by mouth daily.    . carboxymethylcellulose (REFRESH PLUS) 0.5 % SOLN Place 1 drop into both eyes 4 (four) times daily.    Marland Kitchen docusate sodium (COLACE) 100 MG capsule Take 1 capsule (100 mg total) by mouth 2 (two) times daily. 10 capsule 0  . Ensure Plus (ENSURE PLUS) LIQD Take 237 mLs by mouth 2 (two) times daily between meals.    . famotidine (PEPCID) 20 MG tablet Take 20 mg by mouth daily.     . iron sucrose (VENOFER) 20 MG/ML injection Iron Sucrose (Venofer)    . midodrine (PROAMATINE) 10 MG tablet Take 10 mg by mouth  2 (two) times daily.     . midodrine (PROAMATINE) 5 MG tablet Take Every Tuesday, Thursday, Saturday 30 tablet 6  . Nutritional Supplements (FEEDING SUPPLEMENT, NEPRO CARB STEADY,) LIQD Take 237 mLs by mouth at bedtime.    Marland Kitchen nystatin ointment (MYCOSTATIN) Apply 1 application topically daily as needed for itching. Apply to Penis as needed if itching occurs.    . polyethylene glycol (MIRALAX / GLYCOLAX) 17 g packet Take 17 g by mouth daily. 14 each 0  . PREDNISONE PO Take 60 mg by mouth daily.     Marland Kitchen senna (SENOKOT) 8.6 MG tablet Take 1 tablet by mouth as needed for constipation.     . sulfamethoxazole-trimethoprim (BACTRIM) 400-80 MG tablet Take 1 tablet by mouth 3 (three) times a week. Monday, Wednesday, Friday    . triamcinolone cream (KENALOG) 0.1 % Apply 1 application topically 2 (two) times daily. Apply to skin that is intact. DO NOT apply to "burn wounds".      Allergies as of 11/23/2019 - Review Complete 11/23/2019  Allergen Reaction Noted  . Atorvastatin Anaphylaxis and Rash 06/18/2019  . Adhesive [tape]  11/01/2019  . Amiodarone  07/06/2019  . Reglan [metoclopramide]  07/06/2019  . Heparin Rash 05/17/2019    Family History  Problem Relation Age of Onset  . CAD Mother 42  . CAD Father 87  . CAD Sister   . CAD Brother     Social History   Socioeconomic History  . Marital status: Married    Spouse name: Not on file  . Number of children: 2  . Years of education: Not on file  . Highest education level: Not on file  Occupational History  . Occupation: Librarian, academic in supply    Comment: [employer]  Tobacco Use  . Smoking status: Former Smoker    Packs/day: 0.25    Years: 50.00    Pack years: 12.50    Types: Cigarettes    Quit date: 02/15/1990    Years since quitting: 29.7  . Smokeless tobacco: Never Used  Vaping Use  . Vaping Use: Never used  Substance and Sexual Activity  . Alcohol use: No  . Drug use: No  . Sexual activity: Not Currently  Other Topics Concern  .  Not on  file  Social History Narrative  . Not on file   Social Determinants of Health   Financial Resource Strain:   . Difficulty of Paying Living Expenses: Not on file  Food Insecurity:   . Worried About Charity fundraiser in the Last Year: Not on file  . Ran Out of Food in the Last Year: Not on file  Transportation Needs:   . Lack of Transportation (Medical): Not on file  . Lack of Transportation (Non-Medical): Not on file  Physical Activity:   . Days of Exercise per Week: Not on file  . Minutes of Exercise per Session: Not on file  Stress:   . Feeling of Stress : Not on file  Social Connections:   . Frequency of Communication with Friends and Family: Not on file  . Frequency of Social Gatherings with Friends and Family: Not on file  . Attends Religious Services: Not on file  . Active Member of Clubs or Organizations: Not on file  . Attends Archivist Meetings: Not on file  . Marital Status: Not on file  Intimate Partner Violence:   . Fear of Current or Ex-Partner: Not on file  . Emotionally Abused: Not on file  . Physically Abused: Not on file  . Sexually Abused: Not on file    Review of Systems: General: Negative for anorexia, weight loss, fever, chills, fatigue, weakness. ENT: Negative for hoarseness, difficulty swallowing. CV: Negative for chest pain, angina, palpitations, peripheral edema.  Respiratory: Negative for dyspnea at rest, cough, sputum, wheezing.  GI: See history of present illness. Derm: Negative for rash or itching.  Neuro: Some recent weakness. Endo: Negative for unusual weight change.  Heme: Negative for bruising or bleeding. Allergy: Negative for rash or hives.  Physical Exam: Vital signs in last 24 hours: Temp:  [97.8 F (36.6 C)] 97.8 F (36.6 C) (10/08 1255) Pulse Rate:  [94-108] 108 (10/08 1415) Resp:  [15-17] 15 (10/08 1415) BP: (103-104)/(43-44) 103/43 (10/08 1415) SpO2:  [96 %-98 %] 96 % (10/08 1415) Weight:  [71 kg] 71 kg  (10/08 1252)   General:   Alert,  Well-developed, well-nourished, pleasant and cooperative in NAD Head:  Normocephalic and atraumatic. Eyes:  Sclera clear, no icterus. Conjunctiva pink. Ears:  Normal auditory acuity. Neck:  Supple; no masses or thyromegaly. Lungs:  Clear throughout to auscultation. No wheezes, crackles, or rhonchi. No acute distress. Heart:  Regular rate and rhythm; no murmurs, clicks, rubs,  or gallops. Abdomen:  Soft, nontender and nondistended. No masses, hepatosplenomegaly or hernias noted. Normal bowel sounds, without guarding, and without rebound.   Rectal:  Deferred until time of colonoscopy.   Msk:  Symmetrical without gross deformities. Pulses:  Normal bilateral DP pulses noted. Extremities:  Without clubbing or edema. Neurologic:  Alert and  oriented x4;  grossly normal neurologically. Skin:  Intact without significant lesions or rashes. Psych:  Alert and cooperative. Normal mood and affect.  Intake/Output from previous day: No intake/output data recorded. Intake/Output this shift: No intake/output data recorded.  Lab Results: Recent Labs    11/23/19 1403  WBC 6.2  HGB 6.6*  HCT 22.1*  PLT 192   BMET Recent Labs    11/23/19 1403  NA 135  K 3.5  CL 93*  CO2 28  GLUCOSE 212*  BUN 28*  CREATININE 3.04*  CALCIUM 7.6*   LFT Recent Labs    11/23/19 1403  PROT 4.8*  ALBUMIN 2.2*  AST 22  ALT 12  ALKPHOS 51  BILITOT 0.5   PT/INR Recent Labs    11/23/19 1403  LABPROT 13.8  INR 1.1   Hepatitis Panel No results for input(s): HEPBSAG, HCVAB, HEPAIGM, HEPBIGM in the last 72 hours. C-Diff No results for input(s): CDIFFTOX in the last 72 hours.  Studies/Results: No results found.  Impression: Very pleasant 86 year old male who presents from the nursing home with maroon-red bloody stools.  He began having worsening weakness and fatigue this morning as well.  He presented to emergency department.  His hemoglobin is found to be 6.6.  He  does have a significant history of GI bleed as noted in HPI with a colonoscopy in 2019 and a flexible sigmoidoscopy during hospitalization in 2021.  Currently his vital signs are stable with A. fib and heart rate between 78-108, blood pressure systolic from 888-280.  Satting well on room air.  Afebrile.  Electrolytes look good with normal sodium, potassium.  Acute GI bleed - He did have a flexible sigmoidoscopy in August 2021 which found a single ulcer status post biopsy.  He was previously on anticoagulation, but at this point it appears he is only on baby aspirin daily.  Query worsening stercoral ulcer versus solitary ulcer syndrome in the setting of worsening constipation.  He also does have diverticulosis and diverticular bleed is a distinct possibility.  Pending results may be able to have endoscopic intervention to help with recurrent bleeding.  Less likely rapid transit upper GI bleed.  His BUN was elevated, however he has end-stage renal disease and his BUN is typically elevated.  Constipation - The patient has had worsening constipation recently and has a history of possible stercoral ulcer as per flexible sigmoidoscopy in August of this year.  He will likely need a better bowel regimen to prevent recurrent constipation.  Plan: 1. We will discuss with Dr. Jenetta Downer possible need for flexible sigmoidoscopy as inpatient 2. Monitor hemoglobin, recheck in the morning 3. Transfuse as necessary (goal hgb >8.0) 4. N.p.o. after midnight 5. Supportive measures 6. Further recommendations to follow   Thank you for allowing [place] to participate in the care of Jesse Murphy  Walden Field, DNP, AGNP-C Adult & Gerontological Nurse Practitioner [facility]   LOS: 0 days     11/23/2019, 3:07 PM

## 2019-11-24 ENCOUNTER — Inpatient Hospital Stay (HOSPITAL_COMMUNITY): Payer: Medicare Other | Admitting: Anesthesiology

## 2019-11-24 ENCOUNTER — Encounter (HOSPITAL_COMMUNITY)
Admission: EM | Disposition: A | Discharge status: Skilled Nursing Facility | Payer: Self-pay | Source: Skilled Nursing Facility | Attending: Internal Medicine

## 2019-11-24 DIAGNOSIS — N186 End stage renal disease: Secondary | ICD-10-CM

## 2019-11-24 DIAGNOSIS — D62 Acute posthemorrhagic anemia: Secondary | ICD-10-CM

## 2019-11-24 DIAGNOSIS — K5641 Fecal impaction: Secondary | ICD-10-CM

## 2019-11-24 DIAGNOSIS — K625 Hemorrhage of anus and rectum: Secondary | ICD-10-CM

## 2019-11-24 DIAGNOSIS — Z992 Dependence on renal dialysis: Secondary | ICD-10-CM

## 2019-11-24 DIAGNOSIS — K6389 Other specified diseases of intestine: Secondary | ICD-10-CM

## 2019-11-24 DIAGNOSIS — D125 Benign neoplasm of sigmoid colon: Secondary | ICD-10-CM

## 2019-11-24 DIAGNOSIS — I5042 Chronic combined systolic (congestive) and diastolic (congestive) heart failure: Secondary | ICD-10-CM

## 2019-11-24 DIAGNOSIS — I2510 Atherosclerotic heart disease of native coronary artery without angina pectoris: Secondary | ICD-10-CM

## 2019-11-24 DIAGNOSIS — Z8719 Personal history of other diseases of the digestive system: Secondary | ICD-10-CM

## 2019-11-24 DIAGNOSIS — L100 Pemphigus vulgaris: Secondary | ICD-10-CM

## 2019-11-24 DIAGNOSIS — N2581 Secondary hyperparathyroidism of renal origin: Secondary | ICD-10-CM

## 2019-11-24 DIAGNOSIS — I480 Paroxysmal atrial fibrillation: Secondary | ICD-10-CM

## 2019-11-24 DIAGNOSIS — I2583 Coronary atherosclerosis due to lipid rich plaque: Secondary | ICD-10-CM

## 2019-11-24 DIAGNOSIS — K922 Gastrointestinal hemorrhage, unspecified: Secondary | ICD-10-CM

## 2019-11-24 HISTORY — PX: FLEXIBLE SIGMOIDOSCOPY: SHX5431

## 2019-11-24 HISTORY — PX: POLYPECTOMY: SHX5525

## 2019-11-24 LAB — BASIC METABOLIC PANEL
Anion gap: 13 (ref 5–15)
BUN: 34 mg/dL — ABNORMAL HIGH (ref 8–23)
CO2: 27 mmol/L (ref 22–32)
Calcium: 7.6 mg/dL — ABNORMAL LOW (ref 8.9–10.3)
Chloride: 96 mmol/L — ABNORMAL LOW (ref 98–111)
Creatinine, Ser: 3.49 mg/dL — ABNORMAL HIGH (ref 0.61–1.24)
GFR, Estimated: 15 mL/min — ABNORMAL LOW (ref >60)
Glucose, Bld: 77 mg/dL (ref 70–99)
Potassium: 3.8 mmol/L (ref 3.5–5.1)
Sodium: 136 mmol/L (ref 135–145)

## 2019-11-24 LAB — CBC WITH DIFFERENTIAL/PLATELET
Abs Immature Granulocytes: 0.32 10*3/uL — ABNORMAL HIGH (ref 0.00–0.07)
Basophils Absolute: 0 10*3/uL (ref 0.0–0.1)
Basophils Relative: 0 %
Eosinophils Absolute: 0 10*3/uL (ref 0.0–0.5)
Eosinophils Relative: 0 %
HCT: 29.7 % — ABNORMAL LOW (ref 39.0–52.0)
Hemoglobin: 9.8 g/dL — ABNORMAL LOW (ref 13.0–17.0)
Immature Granulocytes: 5 %
Lymphocytes Relative: 14 %
Lymphs Abs: 1 10*3/uL (ref 0.7–4.0)
MCH: 31.4 pg (ref 26.0–34.0)
MCHC: 33 g/dL (ref 30.0–36.0)
MCV: 95.2 fL (ref 80.0–100.0)
Monocytes Absolute: 0.8 10*3/uL (ref 0.1–1.0)
Monocytes Relative: 11 %
Neutro Abs: 4.8 10*3/uL (ref 1.7–7.7)
Neutrophils Relative %: 70 %
Platelets: 135 10*3/uL — ABNORMAL LOW (ref 150–400)
RBC: 3.12 MIL/uL — ABNORMAL LOW (ref 4.22–5.81)
RDW: 19.7 % — ABNORMAL HIGH (ref 11.5–15.5)
WBC: 6.9 10*3/uL (ref 4.0–10.5)
nRBC: 0.9 % — ABNORMAL HIGH (ref 0.0–0.2)

## 2019-11-24 LAB — BPAM RBC
Blood Product Expiration Date: 202111052359
Blood Product Expiration Date: 202111052359
ISSUE DATE / TIME: 202110081744
ISSUE DATE / TIME: 202110082044
Unit Type and Rh: 5100
Unit Type and Rh: 5100

## 2019-11-24 LAB — TYPE AND SCREEN
ABO/RH(D): B POS
Antibody Screen: NEGATIVE
Unit division: 0
Unit division: 0

## 2019-11-24 SURGERY — SIGMOIDOSCOPY, FLEXIBLE
Anesthesia: General

## 2019-11-24 MED ORDER — PROPOFOL 10 MG/ML IV BOLUS
INTRAVENOUS | Status: DC | PRN
  Administered 2019-11-24 (×2): 20 mg via INTRAVENOUS

## 2019-11-24 MED ORDER — PREDNISONE 20 MG PO TABS
60.0000 mg | ORAL_TABLET | Freq: Every day | ORAL | Status: DC
  Administered 2019-11-25: 60 mg via ORAL
  Filled 2019-11-24: qty 1
  Filled 2019-11-24: qty 3
  Filled 2019-11-24 (×2): qty 1

## 2019-11-24 MED ORDER — METHYLPREDNISOLONE SODIUM SUCC 125 MG IJ SOLR
60.0000 mg | Freq: Once | INTRAMUSCULAR | Status: AC
  Administered 2019-11-24: 60 mg via INTRAVENOUS
  Filled 2019-11-24: qty 2

## 2019-11-24 MED ORDER — CHLORHEXIDINE GLUCONATE CLOTH 2 % EX PADS
6.0000 | MEDICATED_PAD | Freq: Every day | CUTANEOUS | Status: DC
  Administered 2019-11-25: 6 via TOPICAL

## 2019-11-24 MED ORDER — PROPOFOL 10 MG/ML IV BOLUS
INTRAVENOUS | Status: AC
  Filled 2019-11-24: qty 20

## 2019-11-24 MED ORDER — SENNOSIDES-DOCUSATE SODIUM 8.6-50 MG PO TABS
2.0000 | ORAL_TABLET | Freq: Every day | ORAL | Status: DC
  Administered 2019-11-25: 2 via ORAL
  Filled 2019-11-24 (×6): qty 2

## 2019-11-24 MED ORDER — FAMOTIDINE 20 MG PO TABS
10.0000 mg | ORAL_TABLET | Freq: Every day | ORAL | Status: DC
  Administered 2019-11-24 – 2019-11-25 (×2): 10 mg via ORAL
  Filled 2019-11-24 (×5): qty 1
  Filled 2019-11-24: qty 0.5

## 2019-11-24 MED ORDER — STERILE WATER FOR IRRIGATION IR SOLN
Status: DC | PRN
  Administered 2019-11-24: 2.5 mL

## 2019-11-24 MED ORDER — SODIUM CHLORIDE 0.9 % IV SOLN: INTRAVENOUS | Status: DC

## 2019-11-24 MED ORDER — LIDOCAINE HCL (CARDIAC) PF 50 MG/5ML IV SOSY
PREFILLED_SYRINGE | INTRAVENOUS | Status: DC | PRN
  Administered 2019-11-24: 80 mg via INTRAVENOUS

## 2019-11-24 MED ORDER — LIDOCAINE 2% (20 MG/ML) 5 ML SYRINGE
INTRAMUSCULAR | Status: AC
  Filled 2019-11-24: qty 5

## 2019-11-24 MED ORDER — POLYETHYLENE GLYCOL 3350 17 G PO PACK
17.0000 g | PACK | Freq: Two times a day (BID) | ORAL | Status: DC
  Administered 2019-11-24 – 2019-11-25 (×3): 17 g via ORAL
  Filled 2019-11-24 (×9): qty 1

## 2019-11-24 NOTE — Progress Notes (Signed)
Patient reports feeling well today.  Patient received 2 units PRBC with adequate response, repeat hemoglobin was 9.8.  He states that he has not presented any more bowel movement since yesterday night.  Has not presented any abdominal pain, nausea, vomiting, fever, chills, abdominal distention.  He is not feeling lightheadedness anymore.  Patient is scheduled to receive two tap water enemas.  We will proceed with flexible sigmoidoscopy as scheduled.  I thoroughly discussed with the patient his procedure, including the risks involved. Patient understands what the procedure involves including the benefits and any risks. Patient understands alternatives to the proposed procedure. Risks including (but not limited to) bleeding, tearing of the lining (perforation), rupture of adjacent organs, problems with heart and lung function, infection, and medication reactions. A small percentage of complications may require surgery, hospitalization, repeat endoscopic procedure, and/or transfusion.  Patient understood and agreed.  Maylon Peppers, MD Gastroenterology and Hepatology [facility]

## 2019-11-24 NOTE — Anesthesia Postprocedure Evaluation (Signed)
Anesthesia Post Note  Patient: Jesse Murphy  Procedure(s) Performed: FLEXIBLE SIGMOIDOSCOPY (N/A )  Patient location during evaluation: PACU Anesthesia Type: General Level of consciousness: awake Pain management: pain level controlled Vital Signs Assessment: post-procedure vital signs reviewed and stable Respiratory status: spontaneous breathing Cardiovascular status: blood pressure returned to baseline Anesthetic complications: no   No complications documented.   Last Vitals:  Vitals:   11/24/19 0900 11/24/19 0930  BP: (!) 135/45 (!) 135/48  Pulse: 95 83  Resp: 17 (!) 21  Temp:    SpO2: 99% 97%    Last Pain:  Vitals:   11/24/19 0852  TempSrc: Oral  PainSc:                  [name]

## 2019-11-24 NOTE — OR Nursing (Signed)
Patient awake and talking stating I feel fine. States hes doing ok.

## 2019-11-24 NOTE — Anesthesia Preprocedure Evaluation (Signed)
Anesthesia Evaluation  Patient identified by MRN, date of birth, ID band Patient awake    Reviewed: Allergy & Precautions, H&P , NPO status , Patient's Chart, lab work & pertinent test results, reviewed documented beta blocker date and time   Airway Mallampati: II  TM Distance: >3 FB Neck ROM: full    Dental no notable dental hx.    Pulmonary pneumonia, resolved, former smoker,    Pulmonary exam normal breath sounds clear to auscultation       Cardiovascular Exercise Tolerance: Good hypertension, + CAD, + Past MI and +CHF   Rhythm:regular Rate:Normal     Neuro/Psych negative neurological ROS  negative psych ROS   GI/Hepatic Neg liver ROS, GERD  Medicated,  Endo/Other  diabetes, Well Controlled, Type 2Hypothyroidism   Renal/GU DialysisRenal disease  negative genitourinary   Musculoskeletal   Abdominal   Peds  Hematology  (+) Blood dyscrasia, anemia ,   Anesthesia Other Findings   Reproductive/Obstetrics negative OB ROS                             Anesthesia Physical Anesthesia Plan  ASA: IV and emergent  Anesthesia Plan: General   Post-op Pain Management:    Induction:   PONV Risk Score and Plan: Propofol infusion  Airway Management Planned:   Additional Equipment:   Intra-op Plan:   Post-operative Plan:   Informed Consent: I have reviewed the patients History and Physical, chart, labs and discussed the procedure including the risks, benefits and alternatives for the proposed anesthesia with the patient or authorized representative who has indicated his/her understanding and acceptance.     Dental Advisory Given  Plan Discussed with: CRNA  Anesthesia Plan Comments:         Anesthesia Quick Evaluation

## 2019-11-24 NOTE — Brief Op Note (Signed)
11/23/2019 - 11/24/2019  10:29 AM  PATIENT:  Jesse Murphy  86 y.o. male  PRE-OPERATIVE DIAGNOSIS:  Acute GI bleed, hematochezia, heme+ stool, acute on chronic anemia  POST-OPERATIVE DIAGNOSIS:  flexible sigmoidoscopy up to 25cm;sigmoid polyp(cold biopsy);clot over suuspected ulcer in rectum;  PROCEDURE:  Procedure(s): FLEXIBLE SIGMOIDOSCOPY (N/A)  SURGEON:  Surgeon(s) and Role:    * Harvel Quale, MD - Primary  Performed flexible sigmoidoscopy under propofol today.  Was presence of solid stool in the sigmoid colon and the proximal rectum.  No presence of hematin or fresh blood was found.  There was one polyp of 3 mm in the sigmoid colon which was removed with cold forceps biopsy.  There was visualization of large bleeding clot in the distal rectum, likely there is an underlying ulcer at the same location where large ulcer was found in his last flexible sigmoidoscopy.  No attempts were made to remove the clot.  Recommendations: - Return patient to hospital ward for ongoing care.  - Soft diet today.  - Start Miralax BID and docusate 200 mg daily. - Trend H/H daily.  Maylon Peppers, MD Gastroenterology and Hepatology [facility]

## 2019-11-24 NOTE — Consult Note (Signed)
[facility]  INPATIENT CONSULTATION  Reason for Consultation: ESRD with need for ongoing dialysis Requesting Provider: Dr. Carles Collet  HPI: Jesse Murphy is an 86 y.o. male with A. 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  Presented to ED at [facility] today with rectal bleeding x several days duration.  He underwent endoscopy this AM (flex sig) - sigmoid polyp biopsied, clot over rectal ulcer (previously seen as well on last endo).  Hb initially 6, rec'd 2u pRBC with improvement to 9.8.   Last dialyzed Thurs per outpt schedule.    Currently just tired after long day.   PMH: Past Medical History:  Diagnosis Date  . Anemia   . CAD (coronary artery disease)    a. s/p PCI in 1992 b. Coronary CT in 01/2018 showing extensive coronary calcification; FFR indeterminate --> medical management pursued at that time as patient not interested in repeat cath. c. s/p CABGx3 in 02/2019.  . Cancer [facility])    prostate  . Cardiogenic shock ([place])   . CHB (complete heart block) (HCC)    a. due to vagal event in 2021, requiring temp pacer.  . Chronic combined systolic and diastolic CHF (congestive heart failure) ([place])   . Debilitated   . Decubitus ulcer of coccyx, unspecified pressure ulcer stage   . Dyspnea   . End stage renal disease ([place])   . Esophagitis   . H/O tracheostomy    a. during admit in 2021 for respiratory failure.  . Hematuria   . Hypertension   . Hypoalbuminemia   . Ischemic cardiomyopathy   . Myocardial infarction ([place])   . PAF (paroxysmal atrial fibrillation) ([place])   . 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 Pleural effusion, left    a. s/p pleurX catheter  . Pressure ulcer of both heels   . Pseudomonas pneumonia ([place])   . Thrombocytopenia ([place])   . Walker as ambulation aid    also uses wheelchair   PSH: Past Surgical  History:  Procedure Laterality Date  . BASCILIC VEIN TRANSPOSITION Right 09/10/2019   Procedure: RIGHT ARM 1ST STAGE BASCILIC VEIN TRANSPOSITION;  Surgeon: Angelia Mould, MD;  Location: [place];  Service: Vascular;  Laterality: Right;  . BASCILIC VEIN TRANSPOSITION Right 11/06/2019   Procedure: RIGHT UPPER EXTREMITY SECOND STAGE BASCILIC VEIN TRANSPOSITION;  Surgeon: Angelia Mould, MD;  Location: [place];  Service: Vascular;  Laterality: Right;  . BIOPSY  10/15/2019   Procedure: BIOPSY;  Surgeon: Daneil Dolin, MD;  [facility];  Service: Endoscopy;;  Distal Rectal Ulceration biopsies   . CHEST TUBE INSERTION Left 04/23/2019   Procedure: INSERTION PLEURAL DRAINAGE CATHETER TO DRAIN LEFT PLEURAL EFFUSION;  Surgeon: Ivin Poot, MD;  Location: [place];  Service: Thoracic;  Laterality: Left;  . CLIPPING OF ATRIAL APPENDAGE N/A 03/16/2019   Procedure: CLIPPING OF LEFT  ATRIAL APPENDAGE - USING ATRICLIP SIZE 40;  Surgeon: Ivin Poot, MD;  Location: [place];  Service: Open Heart Surgery;  Laterality: N/A;  . COLONOSCOPY N/A 08/25/2017   Procedure: COLONOSCOPY;  Surgeon: Rogene [place], MD;  [facility];  Service: Endoscopy;  Laterality: N/A;  . CORONARY ARTERY BYPASS GRAFT N/A 03/16/2019   Procedure: CORONARY ARTERY BYPASS GRAFTING (CABG), ON PUMP, TIMES THREE, USING LEFT INTERNAL MAMMARY ARTERY, RIGHT GREATER SAPHENOUS VEIN HARVESTED ENDOSCOPICALLY;  Surgeon: Lucianne Lei  Donney Rankins, MD;  Location: [place];  Service: Open Heart Surgery;  Laterality: N/A;  swan only  . CYSTOSCOPY WITH FULGERATION N/A 04/30/2019   Procedure: CYSTOSCOPY WITH FULGERATION;  Surgeon: Irine Seal, MD;  Location: [place];  Service: Urology;  Laterality: N/A;  . FLEXIBLE SIGMOIDOSCOPY N/A 10/15/2019   Procedure: FLEXIBLE SIGMOIDOSCOPY with propofol;  Surgeon: Daneil Dolin, MD;  [facility];  Service: Endoscopy;  Laterality: N/A;  . HERNIA REPAIR    . IR FLUORO GUIDE CV LINE LEFT   04/23/2019  . IR FLUORO GUIDE CV LINE RIGHT  05/24/2019  . IR US GUIDE VASC ACCESS LEFT  04/23/2019  . MAZE N/A 03/16/2019   Procedure: MAZE;  Surgeon: Ivin Poot, MD;  Location: [place];  Service: Open Heart Surgery;  Laterality: N/A;  . PLACEMENT OF IMPELLA LEFT VENTRICULAR ASSIST DEVICE Right 03/27/2019   Procedure: Placement Of Impella Left Ventricular Assist Device using ABIOMED Impella 5.5 with SmartAssist Device.;  Surgeon: Wonda Olds, MD;  [facility];  Service: Thoracic;  Laterality: Right;  . PROSTATE SURGERY    . REMOVAL OF IMPELLA LEFT VENTRICULAR ASSIST DEVICE N/A 04/10/2019   Procedure: REMOVAL OF IMPELLA LEFT VENTRICULAR ASSIST DEVICE WITH INSERTION OF RIGHT FEMORAL ARTERIAL LINE;  Surgeon: Ivin Poot, MD;  Location: [place];  Service: Open Heart Surgery;  Laterality: N/A;  . REMOVAL OF PLEURAL DRAINAGE CATHETER Left 07/02/2019   Procedure: REMOVAL OF PLEURAL DRAINAGE CATHETER;  Surgeon: Ivin Poot, MD;  Location: [place];  Service: Thoracic;  Laterality: Left;  . RIGHT/LEFT HEART CATH AND CORONARY ANGIOGRAPHY N/A 03/15/2019   Procedure: RIGHT/LEFT HEART CATH AND CORONARY ANGIOGRAPHY;  Surgeon: Burnell Blanks, MD;  Location: [place] CV LAB;  Service: Cardiovascular;  Laterality: N/A;  . TEE WITHOUT CARDIOVERSION N/A 03/16/2019   Procedure: TRANSESOPHAGEAL ECHOCARDIOGRAM (TEE);  Surgeon: Prescott Gum, Collier [place], MD;  Location: [place];  Service: Open Heart Surgery;  Laterality: N/A;  . TRACHEOSTOMY TUBE PLACEMENT N/A 03/27/2019   Procedure: TRACHEOSTOMY placed using Shiley 8DCT Cuffed.;  Surgeon: Wonda Olds, MD;  [facility];  Service: Thoracic;  Laterality: N/A;  . TRANSURETHRAL RESECTION OF BLADDER NECK N/A 05/13/2019   Procedure: CYSTOSCOPY CLOT EVACUATION FULGRATION CYSTOGRAM AND INSTILLATION OF FORMALIN;  Surgeon: Lucas Mallow, MD;  Location: [place];  Service: Urology;  Laterality: N/A;    Past Medical History:  Diagnosis Date  . Anemia   . CAD  (coronary artery disease)    a. s/p PCI in 1992 b. Coronary CT in 01/2018 showing extensive coronary calcification; FFR indeterminate --> medical management pursued at that time as patient not interested in repeat cath. c. s/p CABGx3 in 02/2019.  . Cancer [facility])    prostate  . Cardiogenic shock ([place])   . CHB (complete heart block) (HCC)    a. due to vagal event in 2021, requiring temp pacer.  . Chronic combined systolic and diastolic CHF (congestive heart failure) ([place])   . Debilitated   . Decubitus ulcer of coccyx, unspecified pressure ulcer stage   . Dyspnea   . End stage renal disease ([place])   . Esophagitis   . H/O tracheostomy    a. during admit in 2021 for respiratory failure.  . Hematuria   . Hypertension   . Hypoalbuminemia   . Ischemic cardiomyopathy   . Myocardial infarction ([place])   . PAF (paroxysmal atrial fibrillation) ([place])   . 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 Pleural effusion, left  a. s/p pleurX catheter  . Pressure ulcer of both heels   . Pseudomonas pneumonia ([place])   . Thrombocytopenia ([place])   . Walker as ambulation aid    also uses wheelchair    Medications:  I have reviewed the patient's current medications.  Medications Prior to Admission  Medication Sig Dispense Refill  . acetaminophen (TYLENOL) 500 MG tablet Take 500 mg by mouth every 6 (six) hours as needed for moderate pain.    [place] Kitchen aspirin 81 MG chewable tablet Chew 81 mg by mouth in the morning.     Mar[place] Kitchen b complex-vitamin c-folic acid (NEPHRO-VITE) 0.8 MG TABS tablet Take 1 tablet by mouth daily.    . Calcium Carbonate-Vitamin D 600-400 MG-UNIT tablet Take 1 tablet by mouth daily.    . carboxymethylcellulose (REFRESH PLUS) 0.5 % SOLN Place 1 drop into both eyes 4 (four) times daily.    [place] Kitchen docusate sodium (COLACE) 100 MG capsule Take 1 capsule (100 mg total) by mouth 2 (two) times daily. 10 capsule 0  . Ensure Plus (ENSURE PLUS) LIQD Take 237 mLs by mouth 2 (two) times daily between meals.    . famotidine  (PEPCID) 20 MG tablet Take 20 mg by mouth daily.     . iron sucrose (VENOFER) 20 MG/ML injection Iron Sucrose (Venofer)    . midodrine (PROAMATINE) 10 MG tablet Take 10 mg by mouth 2 (two) times daily.     . midodrine (PROAMATINE) 5 MG tablet Take Every Tuesday, Thursday, Saturday 30 tablet 6  . Nutritional Supplements (FEEDING SUPPLEMENT, NEPRO CARB STEADY,) LIQD Take 237 mLs by mouth at bedtime.    Marland Kitchen nystatin ointment (MYCOSTATIN) Apply 1 application topically daily as needed for itching. Apply to Penis as needed if itching occurs.    . polyethylene glycol (MIRALAX / GLYCOLAX) 17 g packet Take 17 g by mouth daily. 14 each 0  . predniSONE (DELTASONE) 10 MG tablet Take 60 mg by mouth daily.     Marland Kitchen senna (SENOKOT) 8.6 MG tablet Take 1 tablet by mouth as needed for constipation.     . sulfamethoxazole-trimethoprim (BACTRIM) 400-80 MG tablet Take 1 tablet by mouth 3 (three) times a week. Monday, Wednesday, Friday    . triamcinolone cream (KENALOG) 0.1 % Apply 1 application topically 2 (two) times daily. Apply to skin that is intact. DO NOT apply to "burn wounds".      ALLERGIES:   Allergies  Allergen Reactions  . Atorvastatin Anaphylaxis and Rash    Leg pain  . Adhesive [Tape]     Tears skin  . Amiodarone     Rash (see 05/2019 hospital notes), but was on other medications at the time so unclear which agent   . Reglan [Metoclopramide]     Rash (see 05/2019 hospital notes), but was on other medications at the time so unclear which agent  . Heparin Rash    Thrombocytopenia/ HIT    FAM HX: Family History  Problem Relation Age of Onset  . CAD Mother 29  . CAD Father 65  . CAD Sister   . CAD Brother     Social History:   reports that he quit smoking about 29 years ago. His smoking use included cigarettes. He has a 12.50 pack-year smoking history. He has never used smokeless tobacco. He reports that he does not drink alcohol and does not use drugs.  ROS: 12 system ROS per HPI  Blood  pressure 113/62, pulse 83, temperature 97.6 F (36.4 C), resp. rate 15,  height 5\' 8"  (1.727 m), weight 71 kg, SpO2 97 %. PHYSICAL EXAM: Gen: elderly frail, comfortable  Eyes: anicteric ENT: MMM Neck: supple, no JVD CV:  Reg rate on monitor Abd: soft, scaphoid Lungs: normal WOB lying flat GU: no foley Extr:  No edema Neuro: conversant    Results for orders placed or performed during the hospital encounter of 11/23/19 (from the past 48 hour(s))  Type and screen [facility]     Status: None   Collection Time: 11/23/19  1:08 PM  Result Value Ref Range   ABO/RH(D) B POS    Antibody Screen NEG    Sample Expiration 11/26/2019,2359    Unit Number [id number]    Blood Component Type RED CELLS,LR    Unit division 00    Status of Unit ISSUED,FINAL    Transfusion Status OK TO TRANSFUSE    Crossmatch Result Compatible    Unit Number [id number]    Blood Component Type RED CELLS,LR    Unit division 00    Status of Unit ISSUED,FINAL    Transfusion Status OK TO TRANSFUSE    Crossmatch Result      Compatible Performed at [facility], [address] [place], [place] [address]   Prepare RBC (crossmatch)     Status: None   Collection Time: 11/23/19  1:08 PM  Result Value Ref Range   Order Confirmation      ORDER PROCESSED BY BLOOD BANK Performed at [facility], [address] [place], [place] [address]   Respiratory Panel by RT PCR (Flu A&B, Covid) - Nasopharyngeal Swab     Status: None   Collection Time: 11/23/19  1:34 PM   Specimen: Nasopharyngeal Swab  Result Value Ref Range   SARS Coronavirus 2 by RT PCR NEGATIVE NEGATIVE    Comment: (NOTE) SARS-CoV-2 target nucleic acids are NOT DETECTED.  The SARS-CoV-2 RNA is generally detectable in upper respiratoy specimens during the acute phase of infection. The lowest concentration of SARS-CoV-2 viral copies this assay can detect is 131 copies/mL. A negative result does not preclude SARS-Cov-2 infection and should not be  used as the sole basis for treatment or other patient management decisions. A negative result may occur with  improper specimen collection/handling, submission of specimen other than nasopharyngeal swab, presence of viral mutation(s) within the areas targeted by this assay, and inadequate number of viral copies (<131 copies/mL). A negative result must be combined with clinical observations, patient history, and epidemiological information. The expected result is Negative.  Fact Sheet for Patients:  PinkCheek.be  Fact Sheet for Healthcare Providers:  GravelBags.it  This test is no t yet approved or cleared by the [place] FDA and  has been authorized for detection and/or diagnosis of SARS-CoV-2 by FDA under an Emergency Use Authorization (EUA). This EUA will remain  in effect (meaning this test can be used) for the duration of the COVID-19 declaration under Section 564(b)(1) of the Act, 21 U.S.C. section 360bbb-3(b)(1), unless the authorization is terminated or revoked sooner.     Influenza A by PCR NEGATIVE NEGATIVE   Influenza B by PCR NEGATIVE NEGATIVE    Comment: (NOTE) The Xpert Xpress SARS-CoV-2/FLU/RSV assay is intended as an aid in  the diagnosis of influenza from Nasopharyngeal swab specimens and  should not be used as a sole basis for treatment. Nasal washings and  aspirates are unacceptable for Xpert Xpress SARS-CoV-2/FLU/RSV  testing.  Fact Sheet for Patients: PinkCheek.be  Fact Sheet for Healthcare Providers: GravelBags.it  This test  is not yet approved or cleared by the [place] and  has been authorized for detection and/or diagnosis of SARS-CoV-2 by  FDA under an Emergency Use Authorization (EUA). This EUA will remain  in effect (meaning this test can be used) for the duration of the  Covid-19 declaration under Section 564(b)(1) of  the Act, 21  U.S.C. section 360bbb-3(b)(1), unless the authorization is  terminated or revoked. Performed at [facility], [address] [place], [place] [address]   Lactic acid, plasma     Status: Abnormal   Collection Time: 11/23/19  2:00 PM  Result Value Ref Range   Lactic Acid, Venous 4.3 (HH) 0.5 - 1.9 mmol/L    Comment: CRITICAL RESULT CALLED TO, READ BACK BY AND VERIFIED WITH: FARMER E. @ 1439 ON [id number] BY HENDERSON L. Performed at [facility], [address] [place], [place] [address]   Comprehensive metabolic panel     Status: Abnormal   Collection Time: 11/23/19  2:03 PM  Result Value Ref Range   Sodium 135 135 - 145 mmol/L   Potassium 3.5 3.5 - 5.1 mmol/L   Chloride 93 (L) 98 - 111 mmol/L   CO2 28 22 - 32 mmol/L   Glucose, Bld 212 (H) 70 - 99 mg/dL    Comment: Glucose reference range applies only to samples taken after fasting for at least 8 hours.   BUN 28 (H) 8 - 23 mg/dL   Creatinine, Ser 3.04 (H) 0.61 - 1.24 mg/dL   Calcium 7.6 (L) 8.9 - 10.3 mg/dL   Total Protein 4.8 (L) 6.5 - 8.1 g/dL   Albumin 2.2 (L) 3.5 - 5.0 g/dL   AST 22 15 - 41 U/L   ALT 12 0 - 44 U/L   Alkaline Phosphatase 51 38 - 126 U/L   Total Bilirubin 0.5 0.3 - 1.2 mg/dL   GFR, Estimated 17 (L) >60 mL/min   Anion gap 14 5 - 15    Comment: Performed at [facility], [address], [place], [place] [address]  CBC WITH DIFFERENTIAL     Status: Abnormal   Collection Time: 11/23/19  2:03 PM  Result Value Ref Range   WBC 6.2 4.0 - 10.5 K/uL   RBC 2.08 (L) 4.22 - 5.81 MIL/uL   Hemoglobin 6.6 (LL) 13.0 - 17.0 g/dL    Comment: REPEATED TO VERIFY THIS CRITICAL RESULT HAS VERIFIED AND BEEN CALLED TO FARMER BY LATISHA HENDERSON ON 10 08 2021 AT 1444, AND HAS BEEN READ BACK.     HCT 22.1 (L) 39 - 52 %   MCV 106.3 (H) 80.0 - 100.0 fL   MCH 31.7 26.0 - 34.0 pg   MCHC 29.9 (L) 30.0 - 36.0 g/dL   RDW 19.5 (H) 11.5 - 15.5 %   Platelets 192 150 - 400 K/uL   nRBC 0.5 (H) 0.0 - 0.2 %   Neutrophils Relative  % 80 %   Neutro Abs 5.0 1.7 - 7.7 K/uL   Lymphocytes Relative 8 %   Lymphs Abs 0.5 (L) 0.7 - 4.0 K/uL   Monocytes Relative 8 %   Monocytes Absolute 0.5 0.1 - 1.0 K/uL   Eosinophils Relative 0 %   Eosinophils Absolute 0.0 0 - 0 K/uL   Basophils Relative 0 %   Basophils Absolute 0.0 0 - 0 K/uL   Immature Granulocytes 4 %   Abs Immature Granulocytes 0.25 (H) 0.00 - 0.07 K/uL    Comment: Performed at [facility], [address],  [place], [place] [address]  Protime-INR     Status: None   Collection Time: 11/23/19  2:03 PM  Result Value Ref Range   Prothrombin Time 13.8 11.4 - 15.2 seconds   INR 1.1 0.8 - 1.2    Comment: (NOTE) INR goal varies based on device and disease states. Performed at [facility], [address] [place], [place] [address]   Ammonia     Status: Abnormal   Collection Time: 11/23/19  2:03 PM  Result Value Ref Range   Ammonia 44 (H) 9 - 35 umol/L    Comment: Performed at [facility], [address], [place], [place] [address]  Basic metabolic panel     Status: Abnormal   Collection Time: 11/24/19  4:21 AM  Result Value Ref Range   Sodium 136 135 - 145 mmol/L   Potassium 3.8 3.5 - 5.1 mmol/L   Chloride 96 (L) 98 - 111 mmol/L   CO2 27 22 - 32 mmol/L   Glucose, Bld 77 70 - 99 mg/dL    Comment: Glucose reference range applies only to samples taken after fasting for at least 8 hours.   BUN 34 (H) 8 - 23 mg/dL   Creatinine, Ser 3.49 (H) 0.61 - 1.24 mg/dL   Calcium 7.6 (L) 8.9 - 10.3 mg/dL   GFR, Estimated 15 (L) >60 mL/min   Anion gap 13 5 - 15    Comment: Performed at [facility], [address], [place], [place] [address]  CBC with Differential/Platelet     Status: Abnormal   Collection Time: 11/24/19  6:58 AM  Result Value Ref Range   WBC 6.9 4.0 - 10.5 K/uL   RBC 3.12 (L) 4.22 - 5.81 MIL/uL   Hemoglobin 9.8 (L) 13.0 - 17.0 g/dL    Comment: REPEATED TO VERIFY POST TRANSFUSION SPECIMEN    HCT 29.7 (L) 39 - 52 %   MCV 95.2 80.0 - 100.0 fL    Comment:  POST TRANSFUSION SPECIMEN REPEATED TO VERIFY    MCH 31.4 26.0 - 34.0 pg   MCHC 33.0 30.0 - 36.0 g/dL   RDW 19.7 (H) 11.5 - 15.5 %   Platelets 135 (L) 150 - 400 K/uL   nRBC 0.9 (H) 0.0 - 0.2 %   Neutrophils Relative % 70 %   Neutro Abs 4.8 1.7 - 7.7 K/uL   Lymphocytes Relative 14 %   Lymphs Abs 1.0 0.7 - 4.0 K/uL   Monocytes Relative 11 %   Monocytes Absolute 0.8 0.1 - 1.0 K/uL   Eosinophils Relative 0 %   Eosinophils Absolute 0.0 0 - 0 K/uL   Basophils Relative 0 %   Basophils Absolute 0.0 0 - 0 K/uL   Immature Granulocytes 5 %   Abs Immature Granulocytes 0.32 (H) 0.00 - 0.07 K/uL    Comment: Performed at [facility], [address], [place], [place] [address]    No results found.   [place] TTS, 4 hr, 180, 400/800, EDW 62.5kg, 2K/2.25Ca; CVC 9/30/ Phos 3.3, Ca 8.2, PTH 173 10/7 last post wt 61.9kg, Hb 8, mircera 225 q2wks   Assessment/Plan **LGIB:  S/p endoscopy with rectal ulcer noted; care per GI.    **ESRD on HD TTS:  Labs ok today and volume status fine.  Will plan to dialyze tomorrow and then resume TTS schedule here or outpt.    **Anemia:  Acute blood loss + anemia of ESRD:  S/p transfusion with appropriate increase; receives high dose ESA outpt.   **Secondary hyperparathyroidism:  Recent PTH in goal.   **HFpEF:  UF with HD to euvolemia.  **recent dx pemphigus vulgaris:  On high dose pred, following with [facility].  Justin Mend 11/24/2019, 3:23 PM

## 2019-11-24 NOTE — Op Note (Addendum)
[facility] Patient Name: Jesse Murphy Procedure Date: 11/24/2019 8:50 AM MRN: [id number] Date of Birth: 04/20/1928 Attending MD: Maylon Peppers ,  CSN: [id number] Age: 86 Admit Type: Inpatient Procedure:                Flexible Sigmoidoscopy Indications:              Rectal hemorrhage Providers:                Maylon Peppers, Jeralyn Bennett RN, RN, Lurline Del, RN Referring MD:              Medicines:                Monitored Anesthesia Care Complications:            No immediate complications. Estimated Blood Loss:     Estimated blood loss: none. Procedure:                Pre-Anesthesia Assessment:                           - Prior to the procedure, a History and Physical                            was performed, and patient medications, allergies                            and sensitivities were reviewed. The patient's                            tolerance of previous anesthesia was reviewed.                           - The risks and benefits of the procedure and the                            sedation options and risks were discussed with the                            patient. All questions were answered and informed                            consent was obtained.                           - ASA Grade Assessment: IV - A patient with severe                            systemic disease that is a constant threat to life.                           After obtaining informed consent, the scope was                            passed under direct vision.  The [id number]-[id number]                            ([id number]) scope was introduced through the anus and                            advanced to the the sigmoid colon up to 25 cm from                            anal verge. The flexible sigmoidoscopy was                            accomplished without difficulty. The patient                            tolerated the procedure well. Scope In: 10:00:14 AM Scope Out:  10:08:17 AM Total Procedure Duration: 0 hours 8 minutes 3 seconds  Findings:      The perianal and digital rectal examinations were normal.      Solid normal appearing stool was found in the sigmoid colon, making       visualization difficult. No fresh blood or clots were seen in sigmoid       and proximal rectum.      A 3 mm polyp was found in the sigmoid colon. The polyp was sessile. The       polyp was removed with a cold biopsy forceps. Resection and retrieval       were complete.      A large adherent clot (1.5-2 cm in max size) was found in the rectum.       The clot was attached to the distal rectal vault above the dentate line.       No active blood oozing was present. This was visualized as well in       retroflexion I suspect an ulcer is below the clot but no attempts to       remove the clot were made. These changes were slightly better in size       compared to previous ulceration found on flexible sigmoidoscopy on       09/2019. Impression:               .                           - Stool in the sigmoid colon.                           - One 3 mm polyp in the sigmoid colon, removed with                            a cold biopsy forceps. Resected and retrieved.                           - Adherent clot in the distal rectum. Moderate Sedation:      Per Anesthesia Care Recommendation:           - Return patient to hospital ward for ongoing care.                           -  Soft diet today.                           - Start Miralax BID and docusate 200 mg daily.                           - Trend H/H daily.                           - Await pathology results. Procedure Code(s):        --- Professional ---                           [phone], GC, Sigmoidoscopy, flexible; with biopsy,                            single or multiple Diagnosis Code(s):        --- Professional ---                           K63.5, Polyp of colon                           K62.5, Hemorrhage of anus and  rectum CPT copyright 2019 American Medical Association. All rights reserved. The codes documented in this report are preliminary and upon coder review may  be revised to meet current compliance requirements. Maylon Peppers, MD Maylon Peppers,  11/24/2019 10:28:28 AM This report has been signed electronically. Number of Addenda: 0

## 2019-11-24 NOTE — ED Notes (Signed)
2nd tap water enema given. Return of soft, formed stool, brown in color with some bright red pieces.

## 2019-11-24 NOTE — ED Notes (Signed)
Pt given 1 tap water enema per GI MD order.

## 2019-11-24 NOTE — Progress Notes (Signed)
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  He was having a moderate to large amount of maroon stools.  He woke up on the morning of 11/23/2019 with generalized weakness and continued to have hematochezia.  He had some associated lightheadedness and difficulty standing.  Currently, the patient is a resident at [facility] and normally uses a walker to ambulate.  Notably, the patient had a recent hospital admission from 10/13/2019 to 10/17/2019 for acute blood loss anemia secondary to GI bleed.  He had a sigmoidoscopy performed on 10/15/2019 which revealed continue extensive ulcerated distal rectal mucosa with concerns of a stercoral ulcer.  At that time, continued Colace and MiraLAX and high-dose corticosteroid therapy was recommended.Surgical pathology found the biopsies to be ulcerated colonic mucosa with hyperplastic changes and detached necroinflammatory debris without malignancy. He represented with maroon stools.  He denied any fevers, chills, chest pain, shortness breath, nausea, vomiting, diarrhea, abdominal pain, hematemesis.  GI was consulted to assist with management.  2 units PRBC was transfused.  Assessment/Plan: Acute blood loss anemia/lower GI bleed -Likely secondary to rectal ulceration found from previous sigmoidoscopy, likely stercoral ulcer -Appreciate GI consult -Planning for flexible sigmoidoscopy 11/24/2019 -2 units PRBC  transfused -Repeat CBC -holding ASA  ESRD -Nephrology consulted for maintenance dialysis -Normally dialyzes Tuesday, Thursday, Saturday -Patient had second stage of basilic vein transposition on 11/06/2019 by Dr. Scot Dock  Chronic combined systolic and diastolic CHF -He appears to be clinically on the hypervolemic side, but is not in any distress or short of breath -09/05/2019 echo EF 55 to 60%, unable to assess WM, trivial MR -03/13/2019 echo EF 30-35% -Patient has been intolerant to beta-blocker secondary to his low blood pressures and bradycardia -Had not been previously on ACE-I/ARB/ARNI due to CKD  Paroxysmal atrial fibrillation -Intolerant to amiodarone and beta-blockade -Currently in sinus -Not on anticoagulation secondary to recurrent GI bleeds  Pemphigus vulgaris  -August 2021, the patient was admitted for a desquamative rash covering 30% of his body 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 He denies any recent chest pain and his breathing has been at baseline.  - Medical therapy has been limited secondary to his multiple medication intolerances. Remains on ASA 81mg  daily. Not on a BB due to prior bradycardia and previously developed a significant rash while on statin therapy. Would not re-challenge at this time given his recent admission for pemphigus vulgaris. LDL was at 74 in 02/2019 which is close to goal.   Hypotension - He was started on Midodrine by Nephrology and takes 10mg   BID and an extra 5mg  the mornings prior to HD.  Since going to SNF, this has been listed as MWF dosing yet he has dialysis on a Tuesday, Thursday, Saturday schedule. Will adjust med rec with SNF.   History of prostate cancer -Treated with seed implants about 10 years ago  Secondary hyperparathyroidism -per renal     Status is: Inpatient  Remains inpatient appropriate because:IV treatments appropriate due to intensity of illness or inability to take PO   Dispo: The patient is from: SNF              Anticipated d/c is to: SNF              Anticipated d/c date is: 2 days              Patient currently is not medically stable to d/c.        Family Communication:   No Family at bedside  Consultants:  GI, renal  Code Status:   DNR  DVT Prophylaxis:  SCDs   Procedures: As Listed in Progress Note Above  Antibiotics: None      Subjective: Patient denies fevers, chills, headache, chest pain, dyspnea, nausea, vomiting, diarrhea, abdominal pain, dysuria, hematuria, hematochezia, and melena.   Objective: Vitals:   11/24/19 0300 11/24/19 0402 11/24/19 0500 11/24/19 0600  BP: 119/71 130/61 (!) 144/76 (!) 145/56  Pulse: 75 60 82 78  Resp: (!) 23 19 17 20   Temp:      TempSrc:      SpO2: 92% (!) 89% 100% 100%  Weight:      Height:        Intake/Output Summary (Last 24 hours) at 11/24/2019 0737 Last data filed at 11/24/2019 0001 Gross per 24 hour  Intake 1259 ml  Output --  Net 1259 ml   Weight change:  Exam:   General:  Pt is alert, follows commands appropriately, not in acute distress  HEENT: No icterus, No thrush, No neck mass, [place]/AT  Cardiovascular: RRR, S1/S2, no rubs, no gallops  Respiratory:bibasilar rales. No wheeze  Abdomen: Soft/+BS, non tender, non distended, no guarding  Extremities: 1 + LE edema, No lymphangitis, No petechiae, No rashes, no synovitis   Data Reviewed: I have personally reviewed following labs and imaging studies Basic Metabolic Panel: Recent Labs  Lab 11/23/19 1403  11/24/19 0421  NA 135 136  K 3.5 3.8  CL 93* 96*  CO2 28 27  GLUCOSE 212* 77  BUN 28* 34*  CREATININE 3.04* 3.49*  CALCIUM 7.6* 7.6*   Liver Function Tests: Recent Labs  Lab 11/23/19 1403  AST 22  ALT 12  ALKPHOS 51  BILITOT 0.5  PROT 4.8*  ALBUMIN 2.2*   No results for input(s): LIPASE, AMYLASE in the last 168 hours. Recent Labs  Lab 11/23/19 1403  AMMONIA 44*   Coagulation Profile: Recent Labs  Lab 11/23/19 1403  INR 1.1   CBC: Recent Labs  Lab 11/23/19 1403 11/24/19 0658  WBC 6.2 6.9  NEUTROABS 5.0 4.8  HGB 6.6* 9.8*  HCT 22.1* 29.7*  MCV 106.3* 95.2  PLT 192 135*   Cardiac Enzymes: No results for input(s): CKTOTAL, CKMB, CKMBINDEX, TROPONINI in the last 168 hours. BNP: Invalid input(s): POCBNP CBG: No results for input(s): GLUCAP in the last 168 hours. HbA1C: No results for input(s): HGBA1C in the last 72 hours. Urine analysis:    Component Value Date/Time   COLORURINE AMBER (A) 06/22/2019 1830   APPEARANCEUR CLOUDY (A) 06/22/2019 1830  LABSPEC 1.020 06/22/2019 1830   PHURINE 6.5 06/22/2019 1830   GLUCOSEU 100 (A) 06/22/2019 1830   HGBUR LARGE (A) 06/22/2019 1830   BILIRUBINUR MODERATE (A) 06/22/2019 1830   KETONESUR TRACE (A) 06/22/2019 1830   PROTEINUR >300 (A) 06/22/2019 1830   NITRITE POSITIVE (A) 06/22/2019 1830   LEUKOCYTESUR LARGE (A) 06/22/2019 1830   Sepsis Labs: @LABRCNTIP (procalcitonin:4,lacticidven:4) ) Recent Results (from the past 240 hour(s))  Respiratory Panel by RT PCR (Flu A&B, Covid) - Nasopharyngeal Swab     Status: None   Collection Time: 11/23/19  1:34 PM   Specimen: Nasopharyngeal Swab  Result Value Ref Range Status   SARS Coronavirus 2 by RT PCR NEGATIVE NEGATIVE Final    Comment: (NOTE) SARS-CoV-2 target nucleic acids are NOT DETECTED.  The SARS-CoV-2 RNA is generally detectable in upper respiratoy specimens during the acute phase of infection. The lowest concentration of SARS-CoV-2 viral copies this  assay can detect is 131 copies/mL. A negative result does not preclude SARS-Cov-2 infection and should not be used as the sole basis for treatment or other patient management decisions. A negative result may occur with  improper specimen collection/handling, submission of specimen other than nasopharyngeal swab, presence of viral mutation(s) within the areas targeted by this assay, and inadequate number of viral copies (<131 copies/mL). A negative result must be combined with clinical observations, patient history, and epidemiological information. The expected result is Negative.  Fact Sheet for Patients:  PinkCheek.be  Fact Sheet for Healthcare Providers:  GravelBags.it  This test is no t yet approved or cleared by the [place] FDA and  has been authorized for detection and/or diagnosis of SARS-CoV-2 by FDA under an Emergency Use Authorization (EUA). This EUA will remain  in effect (meaning this test can be used) for the duration of the COVID-19 declaration under Section 564(b)(1) of the Act, 21 U.S.C. section 360bbb-3(b)(1), unless the authorization is terminated or revoked sooner.     Influenza A by PCR NEGATIVE NEGATIVE Final   Influenza B by PCR NEGATIVE NEGATIVE Final    Comment: (NOTE) The Xpert Xpress SARS-CoV-2/FLU/RSV assay is intended as an aid in  the diagnosis of influenza from Nasopharyngeal swab specimens and  should not be used as a sole basis for treatment. Nasal washings and  aspirates are unacceptable for Xpert Xpress SARS-CoV-2/FLU/RSV  testing.  Fact Sheet for Patients: PinkCheek.be  Fact Sheet for Healthcare Providers: GravelBags.it  This test is not yet approved or cleared by the [place] FDA and  has been authorized for detection and/or diagnosis of SARS-CoV-2 by  FDA under an Emergency Use Authorization (EUA). This EUA will  remain  in effect (meaning this test can be used) for the duration of the  Covid-19 declaration under Section 564(b)(1) of the Act, 21  U.S.C. section 360bbb-3(b)(1), unless the authorization is  terminated or revoked. Performed at [facility], [address], [place], [place] [address]      Scheduled Meds: . docusate sodium  100 mg Oral BID  . famotidine  20 mg Oral Daily  . midodrine  10 mg Oral BID  . polyethylene glycol  17 g Oral Daily   Continuous Infusions: . sodium chloride Stopped (11/23/19 1553)    Procedures/Studies: DG Chest 1 View  Result Date: 11/19/2019 CLINICAL DATA:  LEFT pleural effusion post thoracentesis, coronary artery disease, end-stage renal disease EXAM: CHEST  1 VIEW COMPARISON:  11/14/2019 FINDINGS: RIGHT jugular dual-lumen central venous catheter tip projecting over superior RIGHT atrium. Normal heart size post  CABG and Maze procedure. Atherosclerotic calcification aorta. Minimal residual LEFT pleural effusion and basilar atelectasis post thoracentesis. No pneumothorax. Osseous structures unremarkable. IMPRESSION: No pneumothorax following LEFT thoracentesis. Minimal residual LEFT pleural effusion and basilar atelectasis. Electronically Signed   By: Lavonia Dana M.D.   On: 11/19/2019 13:33   DG Chest 2 View  Result Date: 11/14/2019 CLINICAL DATA:  Shortness of breath. EXAM: CHEST - 2 VIEW COMPARISON:  October 13, 2019. FINDINGS: The heart size and mediastinal contours are within normal limits. No pneumothorax is noted. Left internal jugular dialysis catheter is noted. Status post coronary bypass graft. Mild left pleural effusion is noted with associated left basilar atelectasis or infiltrate. Minimal right pleural effusion is noted. The visualized skeletal structures are unremarkable. IMPRESSION: Mild left pleural effusion with associated left basilar atelectasis or infiltrate. Minimal right pleural effusion. Electronically Signed   By: Marijo Conception M.D.   On:  11/14/2019 11:34   US THORACENTESIS ASP PLEURAL SPACE W/IMG GUIDE  Result Date: 11/19/2019 INDICATION: LEFT pleural effusion EXAM: ULTRASOUND GUIDED THERAPEUTIC LEFT THORACENTESIS MEDICATIONS: None. COMPLICATIONS: None immediate. PROCEDURE: An ultrasound guided thoracentesis was thoroughly discussed with the patient and questions answered. The benefits, risks, alternatives and complications were also discussed. The patient understands and wishes to proceed with the procedure. Written consent was obtained. Ultrasound was performed to localize and mark an adequate pocket of fluid in the LEFT chest. The area was then prepped and draped in the normal sterile fashion. 1% Lidocaine was used for local anesthesia. Under ultrasound guidance a 8 French thoracentesis catheter was introduced. Initially no fluid was obtained. A 5 [place] Yueh catheter was then placed and thoracentesis was performed. The catheter was removed and a dressing applied. FINDINGS: A total of approximately 750 mL of yellow LEFT pleural fluid was removed. IMPRESSION: Successful ultrasound guided LEFT thoracentesis yielding 750 mL of pleural fluid. Electronically Signed   By: Lavonia Dana M.D.   On: 11/19/2019 13:31    Orson Eva, DO  [facility]  If 7PM-7AM, please contact night-coverage [email or web address] Password [email or web address] 11/24/2019, 7:37 AM   LOS: 1 day

## 2019-11-24 NOTE — Transfer of Care (Signed)
Immediate Anesthesia Transfer of Care Note  Patient: Jesse Murphy  Procedure(s) Performed: FLEXIBLE SIGMOIDOSCOPY (N/A )  Patient Location: PACU  Anesthesia Type:General  Level of Consciousness: awake  Airway & Oxygen Therapy: Patient Spontanous Breathing  Post-op Assessment: Report given to RN  Post vital signs: Reviewed and stable  Last Vitals:  Vitals Value Taken Time  BP    Temp    Pulse    Resp    SpO2      Last Pain:  Vitals:   11/24/19 0852  TempSrc: Oral  PainSc:          Complications: No complications documented.

## 2019-11-25 DIAGNOSIS — I5032 Chronic diastolic (congestive) heart failure: Secondary | ICD-10-CM

## 2019-11-25 LAB — URINALYSIS, ROUTINE W REFLEX MICROSCOPIC
Bilirubin Urine: NEGATIVE
Glucose, UA: NEGATIVE mg/dL
Ketones, ur: NEGATIVE mg/dL
Nitrite: NEGATIVE
Protein, ur: 100 mg/dL — AB
Specific Gravity, Urine: 1.01 (ref 1.005–1.030)
WBC, UA: >50 WBC/hpf — ABNORMAL HIGH (ref 0–5)
pH: 6 (ref 5.0–8.0)

## 2019-11-25 MED ORDER — SODIUM CHLORIDE 0.9 % IV SOLN: 100.0000 mL | INTRAVENOUS | Status: DC | PRN

## 2019-11-25 MED ORDER — ALTEPLASE 2 MG IJ SOLR: 2.0000 mg | Freq: Once | INTRAMUSCULAR | Status: DC | PRN

## 2019-11-25 MED ORDER — MELATONIN 3 MG PO TABS
3.0000 mg | ORAL_TABLET | Freq: Every day | ORAL | Status: DC
  Filled 2019-11-25: qty 1

## 2019-11-25 MED ORDER — POLYETHYLENE GLYCOL 3350 17 G PO PACK: 17.0000 g | PACK | Freq: Two times a day (BID) | ORAL | 0 refills | Status: DC

## 2019-11-25 MED ORDER — LIDOCAINE HCL (PF) 1 % IJ SOLN: 5.0000 mL | INTRAMUSCULAR | Status: DC | PRN

## 2019-11-25 NOTE — Progress Notes (Signed)
  [facility] Progress Note   Subjective:   Feels fine, wants to go home  Objective Vitals:   11/24/19 1138 11/24/19 1756 11/24/19 2203 11/25/19 0323  BP: 113/62 125/65 133/67 (!) 130/58  Pulse:  92 88 79  Resp: 15 18 18 16   Temp:  98.2 F (36.8 C) 98.1 F (36.7 C) 97.9 F (36.6 C)  TempSrc:  Oral Oral Oral  SpO2:  95% 98% 99%  Weight:      Height:       Physical Exam General: comfortable Heart: RRR Lungs: clear Abdomen: soft, nontender Extremities: no edema Dialysis Access: [facility]  Additional Objective Labs: Basic Metabolic Panel: Recent Labs  Lab 11/23/19 1403 11/24/19 0421  NA 135 136  K 3.5 3.8  CL 93* 96*  CO2 28 27  GLUCOSE 212* 77  BUN 28* 34*  CREATININE 3.04* 3.49*  CALCIUM 7.6* 7.6*   Liver Function Tests: Recent Labs  Lab 11/23/19 1403  AST 22  ALT 12  ALKPHOS 51  BILITOT 0.5  PROT 4.8*  ALBUMIN 2.2*   No results for input(s): LIPASE, AMYLASE in the last 168 hours. CBC: Recent Labs  Lab 11/23/19 1403 11/24/19 0658  WBC 6.2 6.9  NEUTROABS 5.0 4.8  HGB 6.6* 9.8*  HCT 22.1* 29.7*  MCV 106.3* 95.2  PLT 192 135*   Blood Culture    Component Value Date/Time   SDES  06/22/2019 1830    URINE, CLEAN CATCH Performed at [facility], [address], [place], [place] [address]    [facility]  06/22/2019 1830    NONE Performed at [facility], [address] [place], [place] [address]    CULT (A) 06/22/2019 1830    <10,000 COLONIES/mL INSIGNIFICANT GROWTH Performed at [facility] [address] [place], [place] [address]    REPTSTATUS 06/24/2019 FINAL 06/22/2019 1830    Cardiac Enzymes: No results for input(s): CKTOTAL, CKMB, CKMBINDEX, TROPONINI in the last 168 hours. CBG: No results for input(s): GLUCAP in the last 168 hours. Iron Studies: No results for input(s): IRON, TIBC, TRANSFERRIN, FERRITIN in the last 72 hours. @lablastinr3 @ Studies/Results: No results found. Medications:  . Chlorhexidine  Gluconate Cloth  6 each Topical Q0600  . docusate sodium  100 mg Oral BID  . famotidine  10 mg Oral Daily  . melatonin  3 mg Oral QHS  . midodrine  10 mg Oral BID  . polyethylene glycol  17 g Oral BID  . predniSONE  60 mg Oral Q breakfast    [place] TTS, 4 hr, 180, 400/800, EDW 62.5kg, 2K/2.25Ca; CVC 9/30/ Phos 3.3, Ca 8.2, PTH 173 10/7 last post wt 61.9kg, Hb 8, mircera 225 q2wks   Assessment/Plan **LGIB:  S/p endoscopy with rectal ulcer noted; care per GI.  [facility] for d/c.  **ESRD on HD TTS:  Labs ok today and volume status fine.  HD today then D/C.  Per pt request will run 2.5h to facilitate a timely discharge and avoid an unneeded overnight stay.   **Anemia:  Acute blood loss + anemia of ESRD:  S/p transfusion with appropriate increase; receives high dose ESA outpt.   **Secondary hyperparathyroidism:  Recent PTH in goal.   **HFpEF:  UF with HD to euvolemia.  **recent dx pemphigus vulgaris:  On high dose pred, following with [facility].  Jannifer Hick MD 11/25/2019, 2:22 PM  [facility] Pager: [phone]

## 2019-11-25 NOTE — Progress Notes (Signed)
Patient being discharged to [facility]. [facility] called to transport. IV removed. Patient aware of going to [facility]. This nurses attempted to give report at 5:30pm receiving nurse was at lunch. Attempting to give report to receiving nurse.

## 2019-11-25 NOTE — TOC Transition Note (Signed)
Transition of Care [facility]) - CM/SW Discharge Note   Patient Details  Name: Jesse Murphy MRN: [id number] Date of Birth: 1928-02-25  Transition of Care [facility]) CM/SW Contact:  Natasha Bence, LCSW Phone Number: 11/25/2019, 4:46 PM   Clinical Narrative:    CSW received notification of patient's readiness for discharge. CSW verified bed availability with [facility]. Debbie with [facility] agreeable to take returning patient. CSW contacted [facility] for transport. [facility] agreeable to provide transport. CSW completed med necessity form. Nurse to call report TOC signing off.   Final next level of care: Skilled Nursing Facility Barriers to Discharge: Barriers Resolved   Patient Goals and CMS Choice Patient states their goals for this hospitalization and ongoing recovery are:: Return to SNF   Choice offered to / list presented to : Patient  Discharge Placement                Patient to be transferred to facility by: [facility] Name of family member notified: Andree Elk Patient and family notified of of transfer: 11/25/19  Discharge Plan and Services                                     Social Determinants of Health (SDOH) Interventions     Readmission Risk Interventions Readmission Risk Prevention Plan 11/25/2019  Transportation Screening Complete  PCP or Specialist Appt within 3-5 Days Complete  HRI or Home Care Consult Complete  Social Work Consult for [place] Planning/Counseling Complete  Palliative Care Screening Complete  Medication Review Press photographer) Complete  Some recent data might be hidden

## 2019-11-25 NOTE — Discharge Summary (Signed)
Physician Discharge Summary  Jesse Murphy UUV:[id number] DOB: [birth year] DOA: 11/23/2019  PCP: Leeanne Rio, MD  Admit date: 11/23/2019 Discharge date: 11/25/2019  Admitted From: SNF Disposition:  SNF  Recommendations for Outpatient Follow-up:  1. Follow up with PCP in 1-2 weeks 2. 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  He was having a moderate to large amount of maroon stools.  He woke up on the morning of 11/23/2019 with generalized weakness and continued to have hematochezia.  He had some associated lightheadedness and difficulty standing.  Currently, the patient is a resident at [facility] and normally uses a walker to ambulate.  Notably, the patient had a recent hospital admission from 10/13/2019 to 10/17/2019 for acute blood loss anemia secondary to GI bleed.  He had a sigmoidoscopy performed on 10/15/2019 which revealed continue extensive ulcerated distal rectal mucosa with concerns of a stercoral ulcer.  At that time, continued Colace and MiraLAX and high-dose corticosteroid therapy was recommended.Surgical pathology found the biopsies to be ulcerated colonic mucosa with hyperplastic changes and detached necroinflammatory debris without malignancy. He represented with maroon stools.  He denied any fevers, chills, chest pain, shortness breath, nausea, vomiting, diarrhea, abdominal pain,  hematemesis.  GI was consulted to assist with management.  2 units PRBC was transfused with Hgb up to 9.8.  GI and renal were consulted  Discharge Diagnoses:  Acute blood loss anemia/lower GI bleed -Likely secondary to rectal ulceration found from previous sigmoidoscopy, likely stercoral ulcer -Appreciate GI consult -flexible sigmoidoscopy 11/24/2019-->one polyp of 3 mm in the sigmoid colon which was removed with cold forceps. There was visualization of large bleeding clot in the distal rectum, likely there is an underlying ulcer at the same location where large ulcer was found in his last flexible sigmoidoscopy. No attempts were made to remove the clot. Area is consistent with previous location of ulcer. This ulcer is likely a stercoral ulcer, -2 units PRBC transfused -Repeat CBC -holding ASA--ok to restart after d/c -cleared by GI to d/c on 10/10 with bowel regimen - Continue Miralax 17 g BID standing - Continue docusate 200 mg qday - Continue Senna 8.6 mg qday  ESRD -Nephrology consulted for maintenance dialysis -Normally dialyzes Tuesday, Thursday, Saturday -Patient had second stage of basilic vein transposition on 11/06/2019 by Dr. Scot Dock  Chronic combined systolic and diastolic CHF -He appears to be clinically on the hypervolemic side, but is not in any distress or short of breath -09/05/2019 echo EF 55 to 60%, unable to assess WM, trivial MR -03/13/2019 echo EF 30-35% -Patient has been intolerant to beta-blocker secondary to his low blood pressures and bradycardia -Had not been previously on ACE-I/ARB/ARNI due to CKD  Paroxysmal atrial fibrillation -Intolerant to amiodarone and beta-blockade -Currently in sinus -Not on anticoagulation secondary to recurrent GI bleeds  Pemphigus vulgaris  -August 2021, the patient was admitted for a desquamative rash covering 30% of his body 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 He denies any recent chest pain and his breathing has been at baseline.  - Medical therapy has been limited secondary to his multiple medication intolerances. Remains on ASA 81mg  daily. Not on a BB due to prior bradycardia and previously developed a significant rash while on statin therapy. Would not re-challenge at this time given his recent admission for pemphigus vulgaris. LDL was at 74 in 02/2019 which is close to goal.   Hypotension - He was started on Midodrine by Nephrology and takes 10mg  BID and an extra 5mg  the mornings prior to HD. Since going to SNF, this has been listed as MWF dosing yet he has dialysis on a Tuesday, Thursday, Saturday schedule. Will adjust med rec with SNF.   History of prostate cancer-Treated with seed implants about 10 years ago  Secondary hyperparathyroidism -per renal    Discharge Instructions   Allergies as of 11/25/2019      Reactions   Atorvastatin Anaphylaxis, Rash   Leg pain   Adhesive [tape]    Tears skin   Amiodarone    Rash (see 05/2019 hospital notes), but was on other medications at the time so unclear which agent   Reglan [metoclopramide]    Rash (see 05/2019 hospital notes), but was on other medications at the time so unclear which agent   Heparin Rash   Thrombocytopenia/ HIT      Medication List    TAKE these medications   acetaminophen 500 MG tablet Commonly known as: TYLENOL Take 500 mg by mouth every 6 (six) hours as needed for moderate pain.   aspirin 81 MG chewable tablet Chew 81 mg by mouth in the morning.   b  complex-vitamin c-folic acid 0.8 MG Tabs tablet Take 1 tablet by mouth daily.   Calcium Carbonate-Vitamin D 600-400 MG-UNIT tablet Take 1 tablet by mouth daily.   carboxymethylcellulose 0.5 % Soln Commonly known as: REFRESH PLUS Place 1 drop into both eyes 4 (four) times daily.   docusate sodium 100 MG capsule Commonly known as: COLACE Take 1 capsule (100 mg total) by mouth 2 (two) times daily.   Ensure Plus Liqd Take 237 mLs by mouth 2 (two) times daily between meals.   feeding supplement (NEPRO CARB STEADY) Liqd Take 237 mLs by mouth at bedtime.   famotidine 20 MG tablet Commonly known as: PEPCID Take 20 mg by mouth daily.   iron sucrose 20 MG/ML injection Commonly known as: VENOFER Iron Sucrose (Venofer)   midodrine 10 MG tablet Commonly known as: PROAMATINE Take 10 mg by mouth 2 (two) times daily.   midodrine 5 MG tablet Commonly known as: PROAMATINE Take Every Tuesday, Thursday, Saturday   nystatin ointment Commonly known as: MYCOSTATIN Apply 1 application topically daily as needed for itching. Apply to Penis as needed if itching occurs.   polyethylene glycol 17 g packet Commonly known as: MIRALAX / GLYCOLAX Take 17 g by mouth 2 (two) times daily. What changed: when to take this   predniSONE 10 MG tablet Commonly known as: DELTASONE Take 60 mg by mouth daily.   senna 8.6 MG  tablet Commonly known as: SENOKOT Take 1 tablet by mouth as needed for constipation.   sulfamethoxazole-trimethoprim 400-80 MG tablet Commonly known as: BACTRIM Take 1 tablet by mouth 3 (three) times a week. Monday, Wednesday, Friday   triamcinolone cream 0.1 % Commonly known as: KENALOG Apply 1 application topically 2 (two) times daily. Apply to skin that is intact. DO NOT apply to "burn wounds".       Allergies  Allergen Reactions  . Atorvastatin Anaphylaxis and Rash    Leg pain  . Adhesive [Tape]     Tears skin  . Amiodarone     Rash (see 05/2019 hospital notes), but  was on other medications at the time so unclear which agent   . Reglan [Metoclopramide]     Rash (see 05/2019 hospital notes), but was on other medications at the time so unclear which agent  . Heparin Rash    Thrombocytopenia/ HIT    Consultations:  GI  renal   Procedures/Studies: DG Chest 1 View  Result Date: 11/19/2019 CLINICAL DATA:  LEFT pleural effusion post thoracentesis, coronary artery disease, end-stage renal disease EXAM: CHEST  1 VIEW COMPARISON:  11/14/2019 FINDINGS: RIGHT jugular dual-lumen central venous catheter tip projecting over superior RIGHT atrium. Normal heart size post CABG and Maze procedure. Atherosclerotic calcification aorta. Minimal residual LEFT pleural effusion and basilar atelectasis post thoracentesis. No pneumothorax. Osseous structures unremarkable. IMPRESSION: No pneumothorax following LEFT thoracentesis. Minimal residual LEFT pleural effusion and basilar atelectasis. Electronically Signed   By: Lavonia Dana M.D.   On: 11/19/2019 13:33   DG Chest 2 View  Result Date: 11/14/2019 CLINICAL DATA:  Shortness of breath. EXAM: CHEST - 2 VIEW COMPARISON:  October 13, 2019. FINDINGS: The heart size and mediastinal contours are within normal limits. No pneumothorax is noted. Left internal jugular dialysis catheter is noted. Status post coronary bypass graft. Mild left pleural effusion is noted with associated left basilar atelectasis or infiltrate. Minimal right pleural effusion is noted. The visualized skeletal structures are unremarkable. IMPRESSION: Mild left pleural effusion with associated left basilar atelectasis or infiltrate. Minimal right pleural effusion. Electronically Signed   By: Marijo Conception M.D.   On: 11/14/2019 11:34   US THORACENTESIS ASP PLEURAL SPACE W/IMG GUIDE  Result Date: 11/19/2019 INDICATION: LEFT pleural effusion EXAM: ULTRASOUND GUIDED THERAPEUTIC LEFT THORACENTESIS MEDICATIONS: None. COMPLICATIONS: None immediate. PROCEDURE: An  ultrasound guided thoracentesis was thoroughly discussed with the patient and questions answered. The benefits, risks, alternatives and complications were also discussed. The patient understands and wishes to proceed with the procedure. Written consent was obtained. Ultrasound was performed to localize and mark an adequate pocket of fluid in the LEFT chest. The area was then prepped and draped in the normal sterile fashion. 1% Lidocaine was used for local anesthesia. Under ultrasound guidance a 8 French thoracentesis catheter was introduced. Initially no fluid was obtained. A 5 [place] Yueh catheter was then placed and thoracentesis was performed. The catheter was removed and a dressing applied. FINDINGS: A total of approximately 750 mL of yellow LEFT pleural fluid was removed. IMPRESSION: Successful ultrasound guided LEFT thoracentesis yielding 750 mL of pleural fluid. Electronically Signed   By: Lavonia Dana M.D.   On: 11/19/2019 13:31        Discharge Exam: Vitals:   11/24/19 2203 11/25/19 0323  BP: 133/67 (!) 130/58  Pulse: 88 79  Resp: 18 16  Temp: 98.1 F (36.7 C) 97.9 F (36.6 C)  SpO2: 98% 99%   Vitals:   11/24/19  1138 11/24/19 1756 11/24/19 2203 11/25/19 0323  BP: 113/62 125/65 133/67 (!) 130/58  Pulse:  92 88 79  Resp: 15 18 18 16   Temp:  98.2 F (36.8 C) 98.1 F (36.7 C) 97.9 F (36.6 C)  TempSrc:  Oral Oral Oral  SpO2:  95% 98% 99%  Weight:      Height:        General: Pt is alert, awake, not in acute distress Cardiovascular: RRR, S1/S2 +, no rubs, no gallops Respiratory: bibasilar rales. No wheeze Abdominal: Soft, NT, ND, bowel sounds + Extremities: no edema, no cyanosis   The results of significant diagnostics from this hospitalization (including imaging, microbiology, ancillary and laboratory) are listed below for reference.    Significant Diagnostic Studies: DG Chest 1 View  Result Date: 11/19/2019 CLINICAL DATA:  LEFT pleural effusion post thoracentesis,  coronary artery disease, end-stage renal disease EXAM: CHEST  1 VIEW COMPARISON:  11/14/2019 FINDINGS: RIGHT jugular dual-lumen central venous catheter tip projecting over superior RIGHT atrium. Normal heart size post CABG and Maze procedure. Atherosclerotic calcification aorta. Minimal residual LEFT pleural effusion and basilar atelectasis post thoracentesis. No pneumothorax. Osseous structures unremarkable. IMPRESSION: No pneumothorax following LEFT thoracentesis. Minimal residual LEFT pleural effusion and basilar atelectasis. Electronically Signed   By: Lavonia Dana M.D.   On: 11/19/2019 13:33   DG Chest 2 View  Result Date: 11/14/2019 CLINICAL DATA:  Shortness of breath. EXAM: CHEST - 2 VIEW COMPARISON:  October 13, 2019. FINDINGS: The heart size and mediastinal contours are within normal limits. No pneumothorax is noted. Left internal jugular dialysis catheter is noted. Status post coronary bypass graft. Mild left pleural effusion is noted with associated left basilar atelectasis or infiltrate. Minimal right pleural effusion is noted. The visualized skeletal structures are unremarkable. IMPRESSION: Mild left pleural effusion with associated left basilar atelectasis or infiltrate. Minimal right pleural effusion. Electronically Signed   By: Marijo Conception M.D.   On: 11/14/2019 11:34   US THORACENTESIS ASP PLEURAL SPACE W/IMG GUIDE  Result Date: 11/19/2019 INDICATION: LEFT pleural effusion EXAM: ULTRASOUND GUIDED THERAPEUTIC LEFT THORACENTESIS MEDICATIONS: None. COMPLICATIONS: None immediate. PROCEDURE: An ultrasound guided thoracentesis was thoroughly discussed with the patient and questions answered. The benefits, risks, alternatives and complications were also discussed. The patient understands and wishes to proceed with the procedure. Written consent was obtained. Ultrasound was performed to localize and mark an adequate pocket of fluid in the LEFT chest. The area was then prepped and draped in the  normal sterile fashion. 1% Lidocaine was used for local anesthesia. Under ultrasound guidance a 8 French thoracentesis catheter was introduced. Initially no fluid was obtained. A 5 [place] Yueh catheter was then placed and thoracentesis was performed. The catheter was removed and a dressing applied. FINDINGS: A total of approximately 750 mL of yellow LEFT pleural fluid was removed. IMPRESSION: Successful ultrasound guided LEFT thoracentesis yielding 750 mL of pleural fluid. Electronically Signed   By: Lavonia Dana M.D.   On: 11/19/2019 13:31     Microbiology: Recent Results (from the past 240 hour(s))  Respiratory Panel by RT PCR (Flu A&B, Covid) - Nasopharyngeal Swab     Status: None   Collection Time: 11/23/19  1:34 PM   Specimen: Nasopharyngeal Swab  Result Value Ref Range Status   SARS Coronavirus 2 by RT PCR NEGATIVE NEGATIVE Final    Comment: (NOTE) SARS-CoV-2 target nucleic acids are NOT DETECTED.  The SARS-CoV-2 RNA is generally detectable in upper respiratoy specimens during the acute phase of  infection. The lowest concentration of SARS-CoV-2 viral copies this assay can detect is 131 copies/mL. A negative result does not preclude SARS-Cov-2 infection and should not be used as the sole basis for treatment or other patient management decisions. A negative result may occur with  improper specimen collection/handling, submission of specimen other than nasopharyngeal swab, presence of viral mutation(s) within the areas targeted by this assay, and inadequate number of viral copies (<131 copies/mL). A negative result must be combined with clinical observations, patient history, and epidemiological information. The expected result is Negative.  Fact Sheet for Patients:  PinkCheek.be  Fact Sheet for Healthcare Providers:  GravelBags.it  This test is no t yet approved or cleared by the [place] FDA and  has been authorized  for detection and/or diagnosis of SARS-CoV-2 by FDA under an Emergency Use Authorization (EUA). This EUA will remain  in effect (meaning this test can be used) for the duration of the COVID-19 declaration under Section 564(b)(1) of the Act, 21 U.S.C. section 360bbb-3(b)(1), unless the authorization is terminated or revoked sooner.     Influenza A by PCR NEGATIVE NEGATIVE Final   Influenza B by PCR NEGATIVE NEGATIVE Final    Comment: (NOTE) The Xpert Xpress SARS-CoV-2/FLU/RSV assay is intended as an aid in  the diagnosis of influenza from Nasopharyngeal swab specimens and  should not be used as a sole basis for treatment. Nasal washings and  aspirates are unacceptable for Xpert Xpress SARS-CoV-2/FLU/RSV  testing.  Fact Sheet for Patients: PinkCheek.be  Fact Sheet for Healthcare Providers: GravelBags.it  This test is not yet approved or cleared by the [place] FDA and  has been authorized for detection and/or diagnosis of SARS-CoV-2 by  FDA under an Emergency Use Authorization (EUA). This EUA will remain  in effect (meaning this test can be used) for the duration of the  Covid-19 declaration under Section 564(b)(1) of the Act, 21  U.S.C. section 360bbb-3(b)(1), unless the authorization is  terminated or revoked. Performed at [facility], [address] [place], [place] [address]      Labs: Basic Metabolic Panel: Recent Labs  Lab 11/23/19 1403 11/24/19 0421  NA 135 136  K 3.5 3.8  CL 93* 96*  CO2 28 27  GLUCOSE 212* 77  BUN 28* 34*  CREATININE 3.04* 3.49*  CALCIUM 7.6* 7.6*   Liver Function Tests: Recent Labs  Lab 11/23/19 1403  AST 22  ALT 12  ALKPHOS 51  BILITOT 0.5  PROT 4.8*  ALBUMIN 2.2*   No results for input(s): LIPASE, AMYLASE in the last 168 hours. Recent Labs  Lab 11/23/19 1403  AMMONIA 44*   CBC: Recent Labs  Lab 11/23/19 1403 11/24/19 0658  WBC 6.2 6.9  NEUTROABS 5.0 4.8    HGB 6.6* 9.8*  HCT 22.1* 29.7*  MCV 106.3* 95.2  PLT 192 135*   Cardiac Enzymes: No results for input(s): CKTOTAL, CKMB, CKMBINDEX, TROPONINI in the last 168 hours. BNP: Invalid input(s): POCBNP CBG: No results for input(s): GLUCAP in the last 168 hours.  Time coordinating discharge:  36 minutes  Signed:  Orson Eva, DO [facility] Pager: [phone] 11/25/2019, 11:49 AM

## 2019-11-25 NOTE — Progress Notes (Signed)
Jesse Murphy, M.D. Gastroenterology & Hepatology   Interval History:  No acute events overnight. Flex sig performed yesterday showed presence of one polyp of 3 mm in the sigmoid colon which was removed with cold forceps.  There was visualization of large bleeding clot in the distal rectum, likely there is an underlying ulcer at the same location where large ulcer was found in his last flexible sigmoidoscopy.  No attempts were made to remove the clot. Patient was put on a standing bowel regimen. Patient reports feeling well today, was able to tolerate diet without any problem. Denies having any more lightheadedness, nausea, vomiting or episodes of rectal bleeding.  Inpatient Medications:  Current Facility-Administered Medications:  .  Chlorhexidine Gluconate Cloth 2 % PADS 6 each, 6 each, Topical, Q0600, Justin Mend, MD, 6 each at 11/25/19 0855 .  docusate sodium (COLACE) capsule 100 mg, 100 mg, Oral, BID, Truett Mainland, DO, 100 mg at 11/25/19 0855 .  famotidine (PEPCID) tablet 10 mg, 10 mg, Oral, Daily, Tat, David, MD, 10 mg at 11/25/19 0855 .  melatonin tablet 3 mg, 3 mg, Oral, QHS, Adefeso, Oladapo, DO .  midodrine (PROAMATINE) tablet 10 mg, 10 mg, Oral, BID, Truett Mainland, DO, 10 mg at 11/25/19 0854 .  ondansetron (ZOFRAN) tablet 4 mg, 4 mg, Oral, Q6H PRN **OR** ondansetron (ZOFRAN) injection 4 mg, 4 mg, Intravenous, Q6H PRN, Stinson, Jacob J, DO .  polyethylene glycol (MIRALAX / GLYCOLAX) packet 17 g, 17 g, Oral, BID, Montez Morita, Shaquitta Burbridge, MD, 17 g at 11/25/19 0855 .  predniSONE (DELTASONE) tablet 60 mg, 60 mg, Oral, Q breakfast, Tat, David, MD, 60 mg at 11/25/19 0855 .  senna (SENOKOT) tablet 8.6 mg, 1 tablet, Oral, PRN, Truett Mainland, DO .  senna-docusate (Senokot-S) tablet 2 tablet, 2 tablet, Oral, Daily, Montez Morita, Quillian Quince, MD, 2 tablet at 11/25/19 [phone]   I/O   No intake or output data in the 24 hours ending 11/25/19 1016   Physical Exam: Temp:  [97.9  F (36.6 C)-98.2 F (36.8 C)] 97.9 F (36.6 C) (10/10 0323) Pulse Rate:  [79-92] 79 (10/10 0323) Resp:  [15-20] 16 (10/10 0323) BP: (113-133)/(58-98) 130/58 (10/10 0323) SpO2:  [95 %-99 %] 99 % (10/10 0323)  Temp (24hrs), Avg:98.1 F (36.7 C), Min:97.9 F (36.6 C), Max:98.2 F (36.8 C) GENERAL: The patient is AO x3, in no acute distress. HEENT: Head is normocephalic and atraumatic. EOMI are intact. Mouth is well hydrated and without lesions. NECK: Supple. No masses LUNGS: Clear to auscultation. No presence of rhonchi/wheezing/rales. Adequate chest expansion HEART: RRR, normal s1 and s2. ABDOMEN: Soft, nontender, no guarding, no peritoneal signs, and nondistended. BS +. No masses. RECTAL: deferred but there was presence of large amount of light brown stool in his bedpan EXTREMITIES: has excoriations and bruises in his upper extremities. Has trace pitting edema in his lower extremities. Has AVF in left arm. Without any cyanosis, clubbing. NEUROLOGIC: AOx3, no focal motor deficit. SKIN: no jaundice, no rashes  Laboratory Data: CBC:     Component Value Date/Time   WBC 6.9 11/24/2019 0658   RBC 3.12 (L) 11/24/2019 0658   HGB 9.8 (L) 11/24/2019 0658   HCT 29.7 (L) 11/24/2019 0658   PLT 135 (L) 11/24/2019 0658   MCV 95.2 11/24/2019 0658   MCH 31.4 11/24/2019 0658   MCHC 33.0 11/24/2019 0658   RDW 19.7 (H) 11/24/2019 0658   LYMPHSABS 1.0 11/24/2019 0658   MONOABS 0.8 11/24/2019 0658   EOSABS 0.0 11/24/2019  0658   BASOSABS 0.0 11/24/2019 0658   COAG:  Lab Results  Component Value Date   INR 1.1 11/23/2019   INR 1.2 10/14/2019   INR 1.1 10/13/2019    BMP:  BMP Latest Ref Rng & Units 11/24/2019 11/23/2019 11/06/2019  Glucose 70 - 99 mg/dL 77 212(H) 102(H)  BUN 8 - 23 mg/dL 34(H) 28(H) 22  Creatinine 0.61 - 1.24 mg/dL 3.49(H) 3.04(H) 3.10(H)  Sodium 135 - 145 mmol/L 136 135 136  Potassium 3.5 - 5.1 mmol/L 3.8 3.5 3.0(L)  Chloride 98 - 111 mmol/L 96(L) 93(L) 94(L)  CO2 22 - 32  mmol/L 27 28 -  Calcium 8.9 - 10.3 mg/dL 7.6(L) 7.6(L) -    HEPATIC:  Hepatic Function Latest Ref Rng & Units 11/23/2019 10/14/2019 10/13/2019  Total Protein 6.5 - 8.1 g/dL 4.8(L) 4.6(L) 4.7(L)  Albumin 3.5 - 5.0 g/dL 2.2(L) 2.2(L) 2.2(L)  AST 15 - 41 U/L '22 20 21  ' ALT 0 - 44 U/L 12 39 48(H)  Alk Phosphatase 38 - 126 U/L 51 56 58  Total Bilirubin 0.3 - 1.2 mg/dL 0.5 0.8 0.6  Bilirubin, Direct 0.0 - 0.2 mg/dL - - -    CARDIAC:  Lab Results  Component Value Date   TROPONINI 0.13 (Jesse Murphy) 06/15/2016      Imaging: I personally reviewed and interpreted the available labs, imaging and endoscopic files.   Assessment/Plan: 86 year old male with past medical history of anemia, coronary artery disease status post PCI, prostate cancer, heart block, heart failure A. fib, end-stage renal disease on hemodialysis, pemphigus vulgaris, thrombocytopenia and recent hospitalization for large rectal bleeding in the setting of stercoral ulcer, who was admitted to the hospital after presenting recurrent large rectal bleeding and blood loss anemia requiring transfusion. Patient has remained HD stable since transfusion and has responded adequately to PRBC, has not presented any more episodes of rectal bleeding. Flex sig performed yesterday showed presence of one polyp of 3 mm in the sigmoid colon which was removed with cold forceps.  There was visualization of large bleeding clot in the distal rectum, likely there is an underlying ulcer at the same location where large ulcer was found in his last flexible sigmoidoscopy.  No attempts were made to remove the clot. Area is consistent with previous location of ulcer. This ulcer is likely a stercoral ulcer, biopsy results were negative in the past for malignancy or solitary rectal ulcer syndrome. At this point, patient should continue with standing bowel regimen as current to avoid any more episodes and allow adequate wound healing. No further endoscopic intervention is warranted  now as he has not presented any further bleeding.  # Stercoral ulcer #  Lower gastrointestinal bleeding   Recommendations - Advance diet as tolerated - Continue Miralax 17 g BID standing - Continue docusate 200 mg qday - Continue Senna 8.6 mg qday - Patient will follow up in GI clinic with Dr. Jenetta Downer in 1-2 weeks. - GI service will sign-off, please call us back if you have any more questions.  Jesse Peppers, MD Gastroenterology and Hepatology [facility]  Note: Occasional unusual wording and randomly placed punctuation marks may result from the use of speech recognition technology to transcribe this document

## 2019-11-25 NOTE — Procedures (Signed)
     HEMODIALYSIS TREATMENT NOTE:   Pt states he is only going to dialyze 2 hours today.  Dr. Johnney Ou was notified.  Pt signed AMA waiver to end treatment early.  He is under EDW with BP 130/62.  Goal 1.5L as tolerated.  Unable to modify HD orders as they were "completed" by another [facility] user.  2 hour session completed.  930cc removed then became hypotensive (asymptomatic) requiring NS bolus.  UF was suspended.  He then c/o leg cramps and another NS bolus was given.  All blood was returned.  Net UF 209cc. No changes from pre-dialysis assessment.  Rockwell Alexandria, RN  Rockwell Alexandria, RN

## 2019-11-27 LAB — SURGICAL PATHOLOGY

## 2019-11-29 ENCOUNTER — Encounter (HOSPITAL_COMMUNITY): Payer: Self-pay | Admitting: Gastroenterology

## 2019-11-29 ENCOUNTER — Ambulatory Visit (INDEPENDENT_AMBULATORY_CARE_PROVIDER_SITE_OTHER): Payer: Medicare Other | Admitting: Gastroenterology

## 2019-11-30 LAB — URINE CULTURE: Culture: >=100000 — AB

## 2019-12-02 ENCOUNTER — Inpatient Hospital Stay (HOSPITAL_COMMUNITY)
Admission: EM | Admit: 2019-12-02 | Discharge: 2019-12-05 | DRG: 193 | Disposition: A | Discharge status: Skilled Nursing Facility | Payer: Medicare Other | Attending: Family Medicine | Admitting: Family Medicine

## 2019-12-02 ENCOUNTER — Encounter (HOSPITAL_COMMUNITY): Payer: Self-pay

## 2019-12-02 ENCOUNTER — Emergency Department (HOSPITAL_COMMUNITY): Payer: Medicare Other

## 2019-12-02 ENCOUNTER — Other Ambulatory Visit: Payer: Self-pay

## 2019-12-02 DIAGNOSIS — I5042 Chronic combined systolic (congestive) and diastolic (congestive) heart failure: Secondary | ICD-10-CM | POA: Diagnosis present

## 2019-12-02 DIAGNOSIS — Z66 Do not resuscitate: Secondary | ICD-10-CM | POA: Diagnosis present

## 2019-12-02 DIAGNOSIS — E119 Type 2 diabetes mellitus without complications: Secondary | ICD-10-CM | POA: Diagnosis not present

## 2019-12-02 DIAGNOSIS — K219 Gastro-esophageal reflux disease without esophagitis: Secondary | ICD-10-CM | POA: Diagnosis present

## 2019-12-02 DIAGNOSIS — I4891 Unspecified atrial fibrillation: Secondary | ICD-10-CM

## 2019-12-02 DIAGNOSIS — I2583 Coronary atherosclerosis due to lipid rich plaque: Secondary | ICD-10-CM | POA: Diagnosis present

## 2019-12-02 DIAGNOSIS — I252 Old myocardial infarction: Secondary | ICD-10-CM

## 2019-12-02 DIAGNOSIS — N400 Enlarged prostate without lower urinary tract symptoms: Secondary | ICD-10-CM | POA: Diagnosis present

## 2019-12-02 DIAGNOSIS — E8809 Other disorders of plasma-protein metabolism, not elsewhere classified: Secondary | ICD-10-CM

## 2019-12-02 DIAGNOSIS — L89621 Pressure ulcer of left heel, stage 1: Secondary | ICD-10-CM | POA: Diagnosis present

## 2019-12-02 DIAGNOSIS — L100 Pemphigus vulgaris: Secondary | ICD-10-CM | POA: Diagnosis present

## 2019-12-02 DIAGNOSIS — I132 Hypertensive heart and chronic kidney disease with heart failure and with stage 5 chronic kidney disease, or end stage renal disease: Secondary | ICD-10-CM | POA: Diagnosis present

## 2019-12-02 DIAGNOSIS — D631 Anemia in chronic kidney disease: Secondary | ICD-10-CM | POA: Diagnosis present

## 2019-12-02 DIAGNOSIS — Z681 Body mass index (BMI) 19 or less, adult: Secondary | ICD-10-CM | POA: Diagnosis not present

## 2019-12-02 DIAGNOSIS — Y95 Nosocomial condition: Secondary | ICD-10-CM | POA: Diagnosis present

## 2019-12-02 DIAGNOSIS — Z9981 Dependence on supplemental oxygen: Secondary | ICD-10-CM

## 2019-12-02 DIAGNOSIS — J189 Pneumonia, unspecified organism: Secondary | ICD-10-CM | POA: Diagnosis present

## 2019-12-02 DIAGNOSIS — Z951 Presence of aortocoronary bypass graft: Secondary | ICD-10-CM

## 2019-12-02 DIAGNOSIS — I255 Ischemic cardiomyopathy: Secondary | ICD-10-CM | POA: Diagnosis present

## 2019-12-02 DIAGNOSIS — J151 Pneumonia due to Pseudomonas: Secondary | ICD-10-CM | POA: Diagnosis present

## 2019-12-02 DIAGNOSIS — Z8249 Family history of ischemic heart disease and other diseases of the circulatory system: Secondary | ICD-10-CM

## 2019-12-02 DIAGNOSIS — I959 Hypotension, unspecified: Secondary | ICD-10-CM | POA: Diagnosis not present

## 2019-12-02 DIAGNOSIS — E039 Hypothyroidism, unspecified: Secondary | ICD-10-CM | POA: Diagnosis present

## 2019-12-02 DIAGNOSIS — R5381 Other malaise: Secondary | ICD-10-CM | POA: Diagnosis present

## 2019-12-02 DIAGNOSIS — R1310 Dysphagia, unspecified: Secondary | ICD-10-CM | POA: Diagnosis not present

## 2019-12-02 DIAGNOSIS — I10 Essential (primary) hypertension: Secondary | ICD-10-CM | POA: Diagnosis present

## 2019-12-02 DIAGNOSIS — I2510 Atherosclerotic heart disease of native coronary artery without angina pectoris: Secondary | ICD-10-CM | POA: Diagnosis present

## 2019-12-02 DIAGNOSIS — D509 Iron deficiency anemia, unspecified: Secondary | ICD-10-CM | POA: Diagnosis present

## 2019-12-02 DIAGNOSIS — C8590 Non-Hodgkin lymphoma, unspecified, unspecified site: Secondary | ICD-10-CM | POA: Diagnosis present

## 2019-12-02 DIAGNOSIS — I9589 Other hypotension: Secondary | ICD-10-CM | POA: Diagnosis present

## 2019-12-02 DIAGNOSIS — E8889 Other specified metabolic disorders: Secondary | ICD-10-CM | POA: Diagnosis present

## 2019-12-02 DIAGNOSIS — Z87891 Personal history of nicotine dependence: Secondary | ICD-10-CM

## 2019-12-02 DIAGNOSIS — K633 Ulcer of intestine: Secondary | ICD-10-CM | POA: Diagnosis present

## 2019-12-02 DIAGNOSIS — R0600 Dyspnea, unspecified: Secondary | ICD-10-CM | POA: Diagnosis not present

## 2019-12-02 DIAGNOSIS — R627 Adult failure to thrive: Secondary | ICD-10-CM | POA: Diagnosis present

## 2019-12-02 DIAGNOSIS — Z8546 Personal history of malignant neoplasm of prostate: Secondary | ICD-10-CM

## 2019-12-02 DIAGNOSIS — L89611 Pressure ulcer of right heel, stage 1: Secondary | ICD-10-CM | POA: Diagnosis present

## 2019-12-02 DIAGNOSIS — K626 Ulcer of anus and rectum: Secondary | ICD-10-CM | POA: Diagnosis not present

## 2019-12-02 DIAGNOSIS — Z888 Allergy status to other drugs, medicaments and biological substances status: Secondary | ICD-10-CM

## 2019-12-02 DIAGNOSIS — D689 Coagulation defect, unspecified: Secondary | ICD-10-CM | POA: Diagnosis present

## 2019-12-02 DIAGNOSIS — R195 Other fecal abnormalities: Secondary | ICD-10-CM | POA: Diagnosis not present

## 2019-12-02 DIAGNOSIS — Z7901 Long term (current) use of anticoagulants: Secondary | ICD-10-CM

## 2019-12-02 DIAGNOSIS — R0609 Other forms of dyspnea: Secondary | ICD-10-CM | POA: Diagnosis present

## 2019-12-02 DIAGNOSIS — I471 Supraventricular tachycardia: Secondary | ICD-10-CM | POA: Diagnosis not present

## 2019-12-02 DIAGNOSIS — I480 Paroxysmal atrial fibrillation: Secondary | ICD-10-CM | POA: Diagnosis present

## 2019-12-02 DIAGNOSIS — E46 Unspecified protein-calorie malnutrition: Secondary | ICD-10-CM

## 2019-12-02 DIAGNOSIS — E1122 Type 2 diabetes mellitus with diabetic chronic kidney disease: Secondary | ICD-10-CM | POA: Diagnosis present

## 2019-12-02 DIAGNOSIS — Z20822 Contact with and (suspected) exposure to covid-19: Secondary | ICD-10-CM | POA: Diagnosis present

## 2019-12-02 DIAGNOSIS — L8990 Pressure ulcer of unspecified site, unspecified stage: Secondary | ICD-10-CM | POA: Diagnosis present

## 2019-12-02 DIAGNOSIS — N186 End stage renal disease: Secondary | ICD-10-CM | POA: Diagnosis present

## 2019-12-02 DIAGNOSIS — Z91048 Other nonmedicinal substance allergy status: Secondary | ICD-10-CM

## 2019-12-02 DIAGNOSIS — Z992 Dependence on renal dialysis: Secondary | ICD-10-CM

## 2019-12-02 LAB — FERRITIN: Ferritin: 1339 ng/mL — ABNORMAL HIGH (ref 24–336)

## 2019-12-02 LAB — CBC WITH DIFFERENTIAL/PLATELET
Abs Immature Granulocytes: 0.07 10*3/uL (ref 0.00–0.07)
Basophils Absolute: 0 10*3/uL (ref 0.0–0.1)
Basophils Relative: 0 %
Eosinophils Absolute: 0 10*3/uL (ref 0.0–0.5)
Eosinophils Relative: 0 %
HCT: 34 % — ABNORMAL LOW (ref 39.0–52.0)
Hemoglobin: 10.2 g/dL — ABNORMAL LOW (ref 13.0–17.0)
Immature Granulocytes: 1 %
Lymphocytes Relative: 2 %
Lymphs Abs: 0.2 10*3/uL — ABNORMAL LOW (ref 0.7–4.0)
MCH: 31.1 pg (ref 26.0–34.0)
MCHC: 30 g/dL (ref 30.0–36.0)
MCV: 103.7 fL — ABNORMAL HIGH (ref 80.0–100.0)
Monocytes Absolute: 0.7 10*3/uL (ref 0.1–1.0)
Monocytes Relative: 8 %
Neutro Abs: 7.9 10*3/uL — ABNORMAL HIGH (ref 1.7–7.7)
Neutrophils Relative %: 89 %
Platelets: 156 10*3/uL (ref 150–400)
RBC: 3.28 MIL/uL — ABNORMAL LOW (ref 4.22–5.81)
RDW: 19.2 % — ABNORMAL HIGH (ref 11.5–15.5)
WBC: 8.9 10*3/uL (ref 4.0–10.5)
nRBC: 0 % (ref 0.0–0.2)

## 2019-12-02 LAB — COMPREHENSIVE METABOLIC PANEL
ALT: 14 U/L (ref 0–44)
AST: 17 U/L (ref 15–41)
Albumin: 2.5 g/dL — ABNORMAL LOW (ref 3.5–5.0)
Alkaline Phosphatase: 64 U/L (ref 38–126)
Anion gap: 13 (ref 5–15)
BUN: 22 mg/dL (ref 8–23)
CO2: 29 mmol/L (ref 22–32)
Calcium: 7.8 mg/dL — ABNORMAL LOW (ref 8.9–10.3)
Chloride: 95 mmol/L — ABNORMAL LOW (ref 98–111)
Creatinine, Ser: 2.65 mg/dL — ABNORMAL HIGH (ref 0.61–1.24)
GFR, Estimated: 21 mL/min — ABNORMAL LOW (ref >60)
Glucose, Bld: 86 mg/dL (ref 70–99)
Potassium: 3.1 mmol/L — ABNORMAL LOW (ref 3.5–5.1)
Sodium: 137 mmol/L (ref 135–145)
Total Bilirubin: 0.7 mg/dL (ref 0.3–1.2)
Total Protein: 5.3 g/dL — ABNORMAL LOW (ref 6.5–8.1)

## 2019-12-02 LAB — TROPONIN I (HIGH SENSITIVITY)
Troponin I (High Sensitivity): 47 ng/L — ABNORMAL HIGH (ref <18)
Troponin I (High Sensitivity): 47 ng/L — ABNORMAL HIGH (ref <18)

## 2019-12-02 LAB — C-REACTIVE PROTEIN: CRP: 9.8 mg/dL — ABNORMAL HIGH (ref <1.0)

## 2019-12-02 LAB — LACTIC ACID, PLASMA
Lactic Acid, Venous: 1.6 mmol/L (ref 0.5–1.9)
Lactic Acid, Venous: 1.3 mmol/L (ref 0.5–1.9)

## 2019-12-02 LAB — LACTATE DEHYDROGENASE: LDH: 196 U/L — ABNORMAL HIGH (ref 98–192)

## 2019-12-02 LAB — RESPIRATORY PANEL BY RT PCR (FLU A&B, COVID)
Influenza A by PCR: NEGATIVE
Influenza B by PCR: NEGATIVE
SARS Coronavirus 2 by RT PCR: NEGATIVE

## 2019-12-02 LAB — TRIGLYCERIDES: Triglycerides: 132 mg/dL (ref <150)

## 2019-12-02 LAB — PROCALCITONIN: Procalcitonin: 1.09 ng/mL

## 2019-12-02 LAB — FIBRINOGEN: Fibrinogen: 467 mg/dL (ref 210–475)

## 2019-12-02 LAB — HIV ANTIBODY (ROUTINE TESTING W REFLEX): HIV Screen 4th Generation wRfx: NONREACTIVE

## 2019-12-02 LAB — D-DIMER, QUANTITATIVE: D-Dimer, Quant: 6.69 ug/mL-FEU — ABNORMAL HIGH (ref 0.00–0.50)

## 2019-12-02 LAB — BRAIN NATRIURETIC PEPTIDE: B Natriuretic Peptide: 1736 pg/mL — ABNORMAL HIGH (ref 0.0–100.0)

## 2019-12-02 MED ORDER — PREDNISONE 20 MG PO TABS
60.0000 mg | ORAL_TABLET | Freq: Every day | ORAL | Status: DC
  Administered 2019-12-03 – 2019-12-04 (×2): 60 mg via ORAL
  Filled 2019-12-02 (×2): qty 3

## 2019-12-02 MED ORDER — FENTANYL CITRATE (PF) 100 MCG/2ML IJ SOLN
INTRAMUSCULAR | Status: DC
Start: 2019-12-02 — End: 2019-12-02
  Filled 2019-12-02: qty 2

## 2019-12-02 MED ORDER — SENNA 8.6 MG PO TABS
1.0000 | ORAL_TABLET | ORAL | Status: DC | PRN
  Filled 2019-12-02: qty 1

## 2019-12-02 MED ORDER — VANCOMYCIN HCL 750 MG/150ML IV SOLN
750.0000 mg | INTRAVENOUS | Status: DC
  Administered 2019-12-04: 750 mg via INTRAVENOUS
  Filled 2019-12-02: qty 150

## 2019-12-02 MED ORDER — ENSURE ENLIVE PO LIQD
237.0000 mL | Freq: Two times a day (BID) | ORAL | Status: DC
  Administered 2019-12-03 (×2): 237 mL via ORAL
  Filled 2019-12-02: qty 237

## 2019-12-02 MED ORDER — ASPIRIN 81 MG PO CHEW
81.0000 mg | CHEWABLE_TABLET | Freq: Every morning | ORAL | Status: DC
  Administered 2019-12-03 – 2019-12-04 (×2): 81 mg via ORAL
  Filled 2019-12-02 (×2): qty 1

## 2019-12-02 MED ORDER — SODIUM CHLORIDE 0.9 % IV SOLN
2.0000 g | INTRAVENOUS | Status: DC
  Administered 2019-12-04: 2 g via INTRAVENOUS
  Filled 2019-12-02: qty 2

## 2019-12-02 MED ORDER — POLYETHYLENE GLYCOL 3350 17 G PO PACK
17.0000 g | PACK | Freq: Two times a day (BID) | ORAL | Status: DC
  Administered 2019-12-03 – 2019-12-04 (×3): 17 g via ORAL
  Filled 2019-12-02 (×5): qty 1

## 2019-12-02 MED ORDER — OXYCODONE-ACETAMINOPHEN 5-325 MG PO TABS: 1.0000 | ORAL_TABLET | ORAL | Status: DC | PRN

## 2019-12-02 MED ORDER — MIDAZOLAM HCL (PF) 10 MG/2ML IJ SOLN: 4.0000 mg | Freq: Once | INTRAMUSCULAR | Status: DC

## 2019-12-02 MED ORDER — DOCUSATE SODIUM 100 MG PO CAPS
100.0000 mg | ORAL_CAPSULE | Freq: Two times a day (BID) | ORAL | Status: DC
  Administered 2019-12-02 – 2019-12-04 (×5): 100 mg via ORAL
  Filled 2019-12-02 (×5): qty 1

## 2019-12-02 MED ORDER — FAMOTIDINE 20 MG PO TABS
20.0000 mg | ORAL_TABLET | Freq: Every day | ORAL | Status: DC
  Filled 2019-12-02: qty 1

## 2019-12-02 MED ORDER — SODIUM CHLORIDE 0.9 % IV SOLN
500.0000 mg | Freq: Once | INTRAVENOUS | Status: AC
  Administered 2019-12-02: 500 mg via INTRAVENOUS
  Filled 2019-12-02: qty 500

## 2019-12-02 MED ORDER — POLYVINYL ALCOHOL 1.4 % OP SOLN
1.0000 [drp] | Freq: Four times a day (QID) | OPHTHALMIC | Status: DC
  Administered 2019-12-03 – 2019-12-04 (×7): 1 [drp] via OPHTHALMIC
  Filled 2019-12-02: qty 15

## 2019-12-02 MED ORDER — MIDODRINE HCL 5 MG PO TABS
10.0000 mg | ORAL_TABLET | Freq: Two times a day (BID) | ORAL | Status: DC
  Administered 2019-12-02 – 2019-12-04 (×5): 10 mg via ORAL
  Filled 2019-12-02 (×7): qty 2

## 2019-12-02 MED ORDER — SULFAMETHOXAZOLE-TRIMETHOPRIM 400-80 MG PO TABS
1.0000 | ORAL_TABLET | ORAL | Status: DC
  Filled 2019-12-02 (×2): qty 1

## 2019-12-02 MED ORDER — MIDAZOLAM HCL 2 MG/2ML IJ SOLN
INTRAMUSCULAR | Status: AC
  Filled 2019-12-02: qty 2

## 2019-12-02 MED ORDER — VANCOMYCIN HCL 1500 MG/300ML IV SOLN
1500.0000 mg | Freq: Once | INTRAVENOUS | Status: AC
  Administered 2019-12-02: 1500 mg via INTRAVENOUS
  Filled 2019-12-02: qty 300

## 2019-12-02 MED ORDER — RENA-VITE PO TABS
1.0000 | ORAL_TABLET | Freq: Every day | ORAL | Status: DC
  Administered 2019-12-02 – 2019-12-04 (×3): 1 via ORAL
  Filled 2019-12-02 (×4): qty 1

## 2019-12-02 MED ORDER — VANCOMYCIN HCL IN DEXTROSE 1-5 GM/200ML-% IV SOLN: 1000.0000 mg | Freq: Once | INTRAVENOUS | Status: DC

## 2019-12-02 MED ORDER — NEPRO/CARBSTEADY PO LIQD
237.0000 mL | Freq: Every day | ORAL | Status: DC
  Administered 2019-12-04: 237 mL via ORAL
  Filled 2019-12-02: qty 237

## 2019-12-02 MED ORDER — SODIUM CHLORIDE 0.9 % IV SOLN
2.0000 g | Freq: Once | INTRAVENOUS | Status: AC
  Administered 2019-12-02: 2 g via INTRAVENOUS
  Filled 2019-12-02: qty 2

## 2019-12-02 MED ORDER — MIDODRINE HCL 5 MG PO TABS
5.0000 mg | ORAL_TABLET | ORAL | Status: DC
  Filled 2019-12-02: qty 1

## 2019-12-02 MED ORDER — CALCIUM CARBONATE-VITAMIN D 500-200 MG-UNIT PO TABS
1.0000 | ORAL_TABLET | Freq: Every day | ORAL | Status: DC
  Administered 2019-12-03 – 2019-12-04 (×2): 1 via ORAL
  Filled 2019-12-02 (×3): qty 1

## 2019-12-02 MED ORDER — TRIAMCINOLONE ACETONIDE 0.1 % EX CREA
1.0000 | TOPICAL_CREAM | Freq: Two times a day (BID) | CUTANEOUS | Status: DC
  Administered 2019-12-03 – 2019-12-04 (×2): 1 via TOPICAL
  Filled 2019-12-02 (×2): qty 15

## 2019-12-02 NOTE — ED Notes (Signed)
PT report called to [facility], verbalized complete understanding of Pt report and current condition denies questions at this time. PT resting on stretcher rails up x 2 call light in hand. Pt VSS NAD PT remains a/o x 4 skin warm to touch. Pt denies needs or pain at this time. Pt is hemodynamically stable and ready for transport to clean ready room.

## 2019-12-02 NOTE — Progress Notes (Signed)
Pharmacy Antibiotic Note  Jesse Murphy is a 86 y.o. male admitted on 12/02/2019 with pneumonia.  Pharmacy has been consulted for Vancomycin and Cefepime dosing.  Plan: Vancomycin 1500 mg IV x 1 dose. Vancomycin 750 mg IV every T-Th-Sat w/ HD Cefepime 2000 mg IV every HD. Monitor labs, c/s, and vanco level as indicated.  Height: 5\' 8"  (172.7 cm) Weight: 62 kg (136 lb 11 oz) IBW/kg (Calculated) : 68.4  Temp (24hrs), Avg:99.4 F (37.4 C), Min:99.4 F (37.4 C), Max:99.4 F (37.4 C)  Recent Labs  Lab 12/02/19 1133 12/02/19 1326  WBC 8.9  --   CREATININE 2.65*  --   LATICACIDVEN 1.6 1.3    Estimated Creatinine Clearance: 16.9 mL/min (A) (by C-G formula based on SCr of 2.65 mg/dL (H)).    Allergies  Allergen Reactions  . Atorvastatin Anaphylaxis and Rash    Leg pain  . Adhesive [Tape]     Tears skin  . Amiodarone     Rash (see 05/2019 hospital notes), but was on other medications at the time so unclear which agent   . Reglan [Metoclopramide]     Rash (see 05/2019 hospital notes), but was on other medications at the time so unclear which agent  . Heparin Rash    Thrombocytopenia/ HIT    Antimicrobials this admission: Vanco 10/17 >>  Cefepime 10/17 >>  Azith 10/17    Microbiology results: 10/17 BCx: pending   Thank you for allowing pharmacy to be a part of this patient's care.  Ramond Craver 12/02/2019 1:55 PM

## 2019-12-02 NOTE — ED Notes (Signed)
Synchronized cardioversion at 1109

## 2019-12-02 NOTE — H&P (Addendum)
History and Physical  [facility]  Jesse Murphy JHE:[id number] DOB: February 16, 1928 DOA: 12/02/2019  PCP: Leeanne Rio, MD  Patient coming from: [facility]   I have personally briefly reviewed patient's old medical records in [place]  Chief Complaint: cough and fever   HPI: Jesse Murphy is a 86 y.o. 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  He is now only on aspirin for anticoagulation.  He reports that he had been doing well until dialysis yesterday where he reports that after the procedure he felt that he was having fever and chills.  He says that he had chills most of the day and night and started having a nonproductive cough and malaise.  He was given Tylenol with some relief in symptoms.  He was sent to the emergency department due to persistent fever and was noted to be hypoxic on arrival and placed on supplemental oxygen.  Unfortunately during ER triage he developed sudden onset of tachycardia hypotension dizziness.  He had to undergo synchronized cardioversion for rapid SVT.  He went back into sinus rhythm after 200 J shock was administered.  Patient had a [employer] COVID-19 vaccine.  ED Course: 99.4, pulse 107, BP 114/63, pulse ox 99% on 2 L nasal cannula.  CRP 9.8.  WBC 8.9, hemoglobin 10.2, platelet count 156.  Sodium 137, potassium 3.1, creatinine 2.65, total protein 5.3, BNP 1736.  High-sensitivity troponin 47.  Lactic acid 1.3.  Chest x-ray with patchy bibasilar opacities.  SARS 2 coronavirus test and influenza a and B testing negative.  D-dimer 6.69.  Fibrinogen 467.  Blood cultures  x2 obtained.  Patient was started on broad-spectrum antibiotic for presumed healthcare associated pneumonia.  Admission was requested for further management.  Cardiologist Dr. Tamala Julian was consulted who recommended consideration of amiodarone infusion if rapid heart rate returns.  Review of Systems: As per HPI otherwise 10 point review of systems negative.   Past Medical History:  Diagnosis Date  . Anemia   . CAD (coronary artery disease)    a. s/p PCI in 1992 b. Coronary CT in 01/2018 showing extensive coronary calcification; FFR indeterminate --> medical management pursued at that time as patient not interested in repeat cath. c. s/p CABGx3 in 02/2019.  . Cancer [facility])    prostate  . Cardiogenic shock ([place])   . CHB (complete heart block) (HCC)    a. due to vagal event in 2021, requiring temp pacer.  . Chronic combined systolic and diastolic CHF (congestive heart failure) ([place])   . Debilitated   . Decubitus ulcer of coccyx, unspecified pressure ulcer stage   . Dyspnea   . End stage renal disease ([place])   . Esophagitis   . H/O tracheostomy    a. during admit in 2021 for respiratory failure.  . Hematuria   . Hypertension   . Hypoalbuminemia   . Ischemic cardiomyopathy   . Myocardial infarction ([place])   . PAF (paroxysmal atrial fibrillation) ([place])   . 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 Pleural effusion, left    a. s/p pleurX catheter  . Pressure ulcer of both heels   . Pseudomonas pneumonia ([place])   . Thrombocytopenia ([place])   .  Walker as ambulation aid    also uses wheelchair    Past Surgical History:  Procedure Laterality Date  . BASCILIC VEIN TRANSPOSITION Right 09/10/2019   Procedure: RIGHT ARM 1ST STAGE BASCILIC VEIN TRANSPOSITION;  Surgeon: Angelia Mould, MD;  Location: [place];  Service: Vascular;  Laterality: Right;  . BASCILIC VEIN TRANSPOSITION Right 11/06/2019   Procedure: RIGHT UPPER EXTREMITY SECOND STAGE BASCILIC VEIN TRANSPOSITION;  Surgeon: Angelia Mould, MD;   Location: [place];  Service: Vascular;  Laterality: Right;  . BIOPSY  10/15/2019   Procedure: BIOPSY;  Surgeon: Daneil Dolin, MD;  [facility];  Service: Endoscopy;;  Distal Rectal Ulceration biopsies   . CHEST TUBE INSERTION Left 04/23/2019   Procedure: INSERTION PLEURAL DRAINAGE CATHETER TO DRAIN LEFT PLEURAL EFFUSION;  Surgeon: Ivin Poot, MD;  Location: [place];  Service: Thoracic;  Laterality: Left;  . CLIPPING OF ATRIAL APPENDAGE N/A 03/16/2019   Procedure: CLIPPING OF LEFT  ATRIAL APPENDAGE - USING ATRICLIP SIZE 40;  Surgeon: Ivin Poot, MD;  Location: [place];  Service: Open Heart Surgery;  Laterality: N/A;  . COLONOSCOPY N/A 08/25/2017   Procedure: COLONOSCOPY;  Surgeon: Rogene Houston, MD;  [facility];  Service: Endoscopy;  Laterality: N/A;  . CORONARY ARTERY BYPASS GRAFT N/A 03/16/2019   Procedure: CORONARY ARTERY BYPASS GRAFTING (CABG), ON PUMP, TIMES THREE, USING LEFT INTERNAL MAMMARY ARTERY, RIGHT GREATER SAPHENOUS VEIN HARVESTED ENDOSCOPICALLY;  Surgeon: Ivin Poot, MD;  Location: [place];  Service: Open Heart Surgery;  Laterality: N/A;  swan only  . CYSTOSCOPY WITH FULGERATION N/A 04/30/2019   Procedure: CYSTOSCOPY WITH FULGERATION;  Surgeon: Irine Seal, MD;  Location: [place];  Service: Urology;  Laterality: N/A;  . FLEXIBLE SIGMOIDOSCOPY N/A 10/15/2019   Procedure: FLEXIBLE SIGMOIDOSCOPY with propofol;  Surgeon: Daneil Dolin, MD;  [facility];  Service: Endoscopy;  Laterality: N/A;  . FLEXIBLE SIGMOIDOSCOPY N/A 11/24/2019   Procedure: FLEXIBLE SIGMOIDOSCOPY;  Surgeon: Harvel Quale, MD;  [facility];  Service: Gastroenterology;  Laterality: N/A;  . HERNIA REPAIR    . IR FLUORO GUIDE CV LINE LEFT  04/23/2019  . IR FLUORO GUIDE CV LINE RIGHT  05/24/2019  . IR US GUIDE VASC ACCESS LEFT  04/23/2019  . MAZE N/A 03/16/2019   Procedure: MAZE;  Surgeon: Ivin Poot, MD;  Location: [place];  Service: Open Heart Surgery;   Laterality: N/A;  . PLACEMENT OF IMPELLA LEFT VENTRICULAR ASSIST DEVICE Right 03/27/2019   Procedure: Placement Of Impella Left Ventricular Assist Device using ABIOMED Impella 5.5 with SmartAssist Device.;  Surgeon: Wonda Olds, MD;  [facility];  Service: Thoracic;  Laterality: Right;  . POLYPECTOMY  11/24/2019   Procedure: POLYPECTOMY;  Surgeon: Harvel Quale, MD;  [facility];  Service: Gastroenterology;;  sigmoid  . PROSTATE SURGERY    . REMOVAL OF IMPELLA LEFT VENTRICULAR ASSIST DEVICE N/A 04/10/2019   Procedure: REMOVAL OF IMPELLA LEFT VENTRICULAR ASSIST DEVICE WITH INSERTION OF RIGHT FEMORAL ARTERIAL LINE;  Surgeon: Ivin Poot, MD;  Location: [place];  Service: Open Heart Surgery;  Laterality: N/A;  . REMOVAL OF PLEURAL DRAINAGE CATHETER Left 07/02/2019   Procedure: REMOVAL OF PLEURAL DRAINAGE CATHETER;  Surgeon: Ivin Poot, MD;  Location: [place];  Service: Thoracic;  Laterality: Left;  . RIGHT/LEFT HEART CATH AND CORONARY ANGIOGRAPHY N/A 03/15/2019   Procedure: RIGHT/LEFT HEART CATH AND CORONARY ANGIOGRAPHY;  Surgeon: Burnell Blanks, MD;  Location: [place] CV [facility];  Service: Cardiovascular;  Laterality: N/A;  . TEE WITHOUT CARDIOVERSION N/A 03/16/2019   Procedure: TRANSESOPHAGEAL ECHOCARDIOGRAM (TEE);  Surgeon: Prescott Gum, Collier Salina, MD;  Location: [place];  Service: Open Heart Surgery;  Laterality: N/A;  . TRACHEOSTOMY TUBE PLACEMENT N/A 03/27/2019   Procedure: TRACHEOSTOMY placed using [place] 8DCT Cuffed.;  Surgeon: Wonda Olds, MD;  [facility];  Service: Thoracic;  Laterality: N/A;  . TRANSURETHRAL RESECTION OF BLADDER NECK N/A 05/13/2019   Procedure: CYSTOSCOPY CLOT EVACUATION FULGRATION CYSTOGRAM AND INSTILLATION OF FORMALIN;  Surgeon: Lucas Mallow, MD;  Location: [place];  Service: Urology;  Laterality: N/A;     reports that he quit smoking about 29 years ago. His smoking use included cigarettes. He has a 12.50 pack-year smoking  history. He has never used smokeless tobacco. He reports that he does not drink alcohol and does not use drugs.  Allergies  Allergen Reactions  . Atorvastatin Anaphylaxis and Rash    Leg pain  . Adhesive [Tape]     Tears skin  . Amiodarone     Rash (see 05/2019 hospital notes), but was on other medications at the time so unclear which agent   . Reglan [Metoclopramide]     Rash (see 05/2019 hospital notes), but was on other medications at the time so unclear which agent  . Heparin Rash    Thrombocytopenia/ HIT    Family History  Problem Relation Age of Onset  . CAD Mother 22  . CAD Father 15  . CAD Sister   . CAD Brother      Prior to Admission medications   Medication Sig Start Date End Date Taking? Authorizing Provider  acetaminophen (TYLENOL) 500 MG tablet Take 500 mg by mouth every 6 (six) hours as needed for moderate pain.   Yes [provider]  aspirin 81 MG chewable tablet Chew 81 mg by mouth in the morning.  05/21/19  Yes [provider]  b complex-vitamin c-folic acid (NEPHRO-VITE) 0.8 MG TABS tablet Take 1 tablet by mouth daily.    [provider]  Calcium Carbonate-Vitamin D 600-400 MG-UNIT tablet Take 1 tablet by mouth daily. 10/08/19 12/07/19  [provider]  carboxymethylcellulose (REFRESH PLUS) 0.5 % SOLN Place 1 drop into both eyes 4 (four) times daily. 10/08/19   [provider]  docusate sodium (COLACE) 100 MG capsule Take 1 capsule (100 mg total) by mouth 2 (two) times daily. 10/17/19   Gherghe, Vella Redhead, MD  Ensure Plus (ENSURE PLUS) LIQD Take 237 mLs by mouth 2 (two) times daily between meals.    [provider]  famotidine (PEPCID) 20 MG tablet Take 20 mg by mouth daily.     [provider]  iron sucrose (VENOFER) 20 MG/ML injection Iron Sucrose (Venofer) 11/17/19 11/15/20  [provider]  midodrine (PROAMATINE) 10 MG tablet Take 10 mg by mouth 2 (two) times daily.  10/08/19 10/07/20  [provider]  midodrine (PROAMATINE) 5 MG tablet Take Every Tuesday, Thursday, Saturday 11/07/19   Ahmed Prima, Fransisco Hertz, PA-C  Nutritional Supplements (FEEDING SUPPLEMENT, NEPRO CARB STEADY,) LIQD Take 237 mLs by mouth at bedtime.    [provider]  nystatin ointment (MYCOSTATIN) Apply 1 application topically daily as needed for itching. Apply to Penis as needed if itching occurs. 06/22/19   [provider]  polyethylene glycol (MIRALAX / GLYCOLAX) 17 g packet Take 17 g by mouth 2 (two) times daily. 11/25/19   Orson Eva,  MD  predniSONE (DELTASONE) 10 MG tablet Take 60 mg by mouth daily.  10/09/19   [provider]  senna (SENOKOT) 8.6 MG tablet Take 1 tablet by mouth as needed for constipation.  10/08/19 10/07/20  [provider]  sulfamethoxazole-trimethoprim (BACTRIM) 400-80 MG tablet Take 1 tablet by mouth 3 (three) times a week. Monday, Wednesday, Friday 10/08/19 12/08/19  [provider]  triamcinolone cream (KENALOG) 0.1 % Apply 1 application topically 2 (two) times daily. Apply to skin that is intact. DO NOT apply to "burn wounds". 10/08/19 10/07/20  [provider]   Physical Exam: Vitals:   12/02/19 1130 12/02/19 1200 12/02/19 1230 12/02/19 1300  BP: 100/90 (!) 122/53 (!) 116/48 (!) 111/50  Pulse: (!) 111 (!) 106 98 89  Resp: (!) 23   20  Temp:      TempSrc:      SpO2: 99% 100% 100% 100%  Weight:      Height:       Constitutional: Frail elderly male lying supine in bed, NAD, calm, comfortable Eyes: PERRL, lids and conjunctivae normal.   ENMT: Mucous membranes are dry. Posterior pharynx clear of any exudate or lesions.  Neck: normal, supple, no masses, no thyromegaly Respiratory: Bibasilar Rales, no wheezing, no crackles. Normal respiratory effort. No accessory muscle use.  Cardiovascular: Irregularly irregular, no murmurs / rubs / gallops. No extremity edema. 2+ pedal pulses. No carotid bruits.  Abdomen: no tenderness, no masses  palpated. No hepatosplenomegaly. Bowel sounds positive.  Musculoskeletal: no clubbing / cyanosis. No joint deformity upper and lower extremities. Good ROM, no contractures. Normal muscle tone.  Skin: no rashes, lesions, ulcers. No induration Neurologic: CN 2-12 grossly intact. Sensation intact, DTR normal. Strength 5/5 in all 4.  Psychiatric: Normal judgment and insight. Alert and oriented x 3. Normal mood.   Labs on Admission: I have personally reviewed following labs and imaging studies  CBC: Recent Labs  Lab 12/02/19 1133  WBC 8.9  NEUTROABS 7.9*  HGB 10.2*  HCT 34.0*  MCV 103.7*  PLT 644   Basic Metabolic Panel: Recent Labs  Lab 12/02/19 1133  NA 137  K 3.1*  CL 95*  CO2 29  GLUCOSE 86  BUN 22  CREATININE 2.65*  CALCIUM 7.8*   GFR: Estimated Creatinine Clearance: 16.9 mL/min (A) (by C-G formula based on SCr of 2.65 mg/dL (H)). Liver Function Tests: Recent Labs  Lab 12/02/19 1133  AST 17  ALT 14  ALKPHOS 64  BILITOT 0.7  PROT 5.3*  ALBUMIN 2.5*   No results for input(s): LIPASE, AMYLASE in the last 168 hours. No results for input(s): AMMONIA in the last 168 hours. Coagulation Profile: No results for input(s): INR, PROTIME in the last 168 hours. Cardiac Enzymes: No results for input(s): CKTOTAL, CKMB, CKMBINDEX, TROPONINI in the last 168 hours. BNP (last 3 results) No results for input(s): PROBNP in the last 8760 hours. HbA1C: No results for input(s): HGBA1C in the last 72 hours. CBG: No results for input(s): GLUCAP in the last 168 hours. Lipid Profile: Recent Labs    12/02/19 1148  TRIG 132   Thyroid Function Tests: No results for input(s): TSH, T4TOTAL, FREET4, T3FREE, THYROIDAB in the last 72 hours. Anemia Panel: Recent Labs    12/02/19 1133  FERRITIN 1,339*   Urine analysis:    Component Value Date/Time   COLORURINE YELLOW 11/25/2019 1012   APPEARANCEUR TURBID (A) 11/25/2019 1012   LABSPEC 1.010 11/25/2019 1012   PHURINE 6.0  11/25/2019 1012  GLUCOSEU NEGATIVE 11/25/2019 1012   HGBUR SMALL (A) 11/25/2019 1012   BILIRUBINUR NEGATIVE 11/25/2019 [place] 11/25/2019 1012   PROTEINUR 100 (A) 11/25/2019 1012   NITRITE NEGATIVE 11/25/2019 1012   LEUKOCYTESUR MODERATE (A) 11/25/2019 1012    Radiological Exams on Admission: DG Chest Port 1 View  Result Date: 12/02/2019 CLINICAL DATA:  Fever, cough. EXAM: PORTABLE CHEST 1 VIEW COMPARISON:  11/19/2019 chest radiograph and prior. FINDINGS: Tunneled left IJ CVC tip overlies the right atrium. Patchy bibasilar opacities. No pneumothorax. Trace left pleural effusion, unchanged. Postsurgical appearance of the cardiomediastinal silhouette. Left lower chest pacing pads. No acute osseous abnormality. IMPRESSION: Patchy bibasilar opacities, atelectasis versus infection. Trace left pleural effusion, unchanged. Electronically Signed   By: Primitivo Gauze M.D.   On: 12/02/2019 12:02   EKG: Independently reviewed. SVT  Assessment/Plan Principal Problem:   HCAP (healthcare-associated pneumonia) Active Problems:   PAF (paroxysmal atrial fibrillation) (HCC)   Dyspnea on exertion   GERD (gastroesophageal reflux disease)   BPH (benign prostatic hyperplasia)   Chronic anticoagulation   Type 2 diabetes mellitus without complications (HCC)   Heme positive stool   Non Hodgkin's lymphoma (HCC)   Hypertension   Atrial fibrillation with RVR (HCC)   Coronary artery disease due to lipid rich plaque   S/P CABG x 3   Pressure injury of skin   Debility   Pressure ulcer   ESRD on dialysis ([place])   Dysphagia   Supplemental oxygen dependent   Hypoalbuminemia due to protein-calorie malnutrition (HCC)   Adult failure to thrive   Anemia in chronic kidney disease   Coagulation defect, unspecified (HCC)   Hypothyroidism, unspecified   Iron deficiency anemia, unspecified   Pemphigus vulgaris   Unspecified protein-calorie malnutrition (HCC)   Systolic and diastolic CHF,  chronic ([place])   Ischemic cardiomyopathy   History of Pseudomonas pneumonia ([place])   Stercoral ulcer of rectum   DNR (do not resuscitate)    1. HCAP - Continue broad spectrum antibiotic coverage for now.  Check MRSA screen.  Follow blood cultures.  Continue supplemental oxygen.  2. Rapid SVT - s/p cardioversion in ED.  Cardiologist Dr. Tamala Julian was consulted and recommended amiodarone infusion if rapid HR returns.  Follow on telemetry for now.   3. Paroxysmal atrial fibrillation - reportedly has been intolerant to amiodarone and beta blockade.  He is not fully anticoagulated due to recurrent GI bleeding.   4. DNR present on admission.  5. Pemphigus vulgaris - He is weaning down high dose steroids. He is now on 60 mg prednisone daily per dermatology.   6. Ischemic cardiomyopathy  7. ESRD on HD - TTS.  Consult to nephrology for maintenance dialysis treatments.  8. Stercoral ulcer - GI saw him recently during last hospitalization recommended continue regular bowel regimen.  Miralax BID, docusate and senna daily.  9. Bilateral heel pressure wounds - heel protection ordered.    10. Chronic combined heart failure - intolerant to beta blockers secondary to low blood pressures and bradycardia.  Echo 7/21 EF 55-60%.   11. CAD - aspirin 81 mg daily.  He is s/p CABG 1/21.   12. Chronic hypotension - He is on midodrine 10 mg BID with an extra 5 mg tab taken prior to HD.   13. History of prostate cancer - s/p seed implants.   DVT prophylaxis:  SCD Code Status: DNR   Family Communication:   Disposition Plan: SNF   Consults called: cardiology   Admission status: INP  Irwin Brakeman MD [facility] How to contact the [facility] Attending or Consulting provider [place] or covering provider during after hours [place], for this patient?  1. Check the care team in [facility] and look for a) attending/consulting [facility] provider listed and b) the [facility] team listed 2. Log into [email or web address] and use [place]'s universal  password to access. If you do not have the password, please contact the hospital operator. 3. Locate the [facility] provider you are looking for under [facility] and page to a number that you can be directly reached. 4. If you still have difficulty reaching the provider, please page the [facility] (Director on Call) for the Hospitalists listed on amion for assistance.   If 7PM-7AM, please contact night-coverage [email or web address] Password [email or web address]  12/02/2019, 2:13 PM

## 2019-12-02 NOTE — ED Triage Notes (Signed)
Pt brought from [place] due to fever, chills, aching and dry cough. Started feeling bad after dialysis then woke up with chills. Given 625 mg of tylenol at 0830. Checked routinely for covid yesterday and was negative. Pt does have pressure ulcers on heels and redness on buttocks

## 2019-12-02 NOTE — ED Notes (Signed)
Pt being triage HR 105, then suddenly increase 170's showing Vtach. Dr Karle Starch notified and at bedside. Preparing for cardioversion

## 2019-12-02 NOTE — ED Provider Notes (Signed)
[facility] EMERGENCY DEPARTMENT Provider Note  CSN: [id number] Arrival date & time: 12/02/19 1038    History Chief Complaint  Patient presents with  . Fever    HPI  Jesse Murphy is a 86 y.o. male with history of CAD, ESRD on HD and PAF brought to the ED via EMS from [facility] for onset of fevers, chills, dry cough that began after dialysis yesterday. Patient given APAP prior to arrival. Checked for Covid at facility yesterday (unknown if PCR or Antigen) and was reportedly negative. Patient reportedly hypoxic on arrival, does not wear O2. Just after arrival, during triage he had sudden onset of rapid HR, became acutely SOB and dizzy.    Past Medical History:  Diagnosis Date  . Anemia   . CAD (coronary artery disease)    a. s/p PCI in 1992 b. Coronary CT in 01/2018 showing extensive coronary calcification; FFR indeterminate --> medical management pursued at that time as patient not interested in repeat cath. c. s/p CABGx3 in 02/2019.  . Cancer [facility])    prostate  . Cardiogenic shock ([place])   . CHB (complete heart block) (HCC)    a. due to vagal event in 2021, requiring temp pacer.  . Chronic combined systolic and diastolic CHF (congestive heart failure) ([place])   . Debilitated   . Decubitus ulcer of coccyx, unspecified pressure ulcer stage   . Dyspnea   . End stage renal disease ([place])   . Esophagitis   . H/O tracheostomy    a. during admit in 2021 for respiratory failure.  . Hematuria   . Hypertension   . Hypoalbuminemia   . Ischemic cardiomyopathy   . Myocardial infarction ([place])   . PAF (paroxysmal atrial fibrillation) ([place])   . 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 Pleural effusion, left    a. s/p pleurX catheter  . Pressure ulcer of both heels   . Pseudomonas pneumonia ([place])   . Thrombocytopenia ([place])   . Walker as ambulation aid    also uses wheelchair    Past Surgical History:  Procedure Laterality Date  . BASCILIC VEIN TRANSPOSITION Right 09/10/2019   Procedure: RIGHT  ARM 1ST STAGE BASCILIC VEIN TRANSPOSITION;  Surgeon: Angelia Mould, MD;  Location: [place];  Service: Vascular;  Laterality: Right;  . BASCILIC VEIN TRANSPOSITION Right 11/06/2019   Procedure: RIGHT UPPER EXTREMITY SECOND STAGE BASCILIC VEIN TRANSPOSITION;  Surgeon: Angelia Mould, MD;  Location: [place];  Service: Vascular;  Laterality: Right;  . BIOPSY  10/15/2019   Procedure: BIOPSY;  Surgeon: Daneil Dolin, MD;  [facility];  Service: Endoscopy;;  Distal Rectal Ulceration biopsies   . CHEST TUBE INSERTION Left 04/23/2019   Procedure: INSERTION PLEURAL DRAINAGE CATHETER TO DRAIN LEFT PLEURAL EFFUSION;  Surgeon: Ivin Poot, MD;  Location: [place];  Service: Thoracic;  Laterality: Left;  . CLIPPING OF ATRIAL APPENDAGE N/A 03/16/2019   Procedure: CLIPPING OF LEFT  ATRIAL APPENDAGE - USING ATRICLIP SIZE 40;  Surgeon: Ivin Poot, MD;  Location: [place];  Service: Open Heart Surgery;  Laterality: N/A;  . COLONOSCOPY N/A 08/25/2017   Procedure: COLONOSCOPY;  Surgeon: Rogene Houston, MD;  [facility];  Service: Endoscopy;  Laterality: N/A;  . CORONARY ARTERY BYPASS GRAFT N/A 03/16/2019   Procedure: CORONARY ARTERY BYPASS GRAFTING (CABG), ON PUMP, TIMES THREE, USING LEFT INTERNAL MAMMARY ARTERY, RIGHT GREATER SAPHENOUS VEIN HARVESTED ENDOSCOPICALLY;  Surgeon: Ivin Poot, MD;  Location: [place];  Service: Open Heart  Surgery;  Laterality: N/A;  swan only  . CYSTOSCOPY WITH FULGERATION N/A 04/30/2019   Procedure: CYSTOSCOPY WITH FULGERATION;  Surgeon: Irine Seal, MD;  Location: [place];  Service: Urology;  Laterality: N/A;  . FLEXIBLE SIGMOIDOSCOPY N/A 10/15/2019   Procedure: FLEXIBLE SIGMOIDOSCOPY with propofol;  Surgeon: Daneil Dolin, MD;  [facility];  Service: Endoscopy;  Laterality: N/A;  . FLEXIBLE SIGMOIDOSCOPY N/A 11/24/2019   Procedure: FLEXIBLE SIGMOIDOSCOPY;  Surgeon: Harvel Quale, MD;  [facility];  Service:  Gastroenterology;  Laterality: N/A;  . HERNIA REPAIR    . IR FLUORO GUIDE CV LINE LEFT  04/23/2019  . IR FLUORO GUIDE CV LINE RIGHT  05/24/2019  . IR US GUIDE VASC ACCESS LEFT  04/23/2019  . MAZE N/A 03/16/2019   Procedure: MAZE;  Surgeon: Ivin Poot, MD;  Location: [place];  Service: Open Heart Surgery;  Laterality: N/A;  . PLACEMENT OF IMPELLA LEFT VENTRICULAR ASSIST DEVICE Right 03/27/2019   Procedure: Placement Of Impella Left Ventricular Assist Device using ABIOMED Impella 5.5 with SmartAssist Device.;  Surgeon: Wonda Olds, MD;  Location: [facility];  Service: Thoracic;  Laterality: Right;  . POLYPECTOMY  11/24/2019   Procedure: POLYPECTOMY;  Surgeon: Harvel Quale, MD;  [facility];  Service: Gastroenterology;;  sigmoid  . PROSTATE SURGERY    . REMOVAL OF IMPELLA LEFT VENTRICULAR ASSIST DEVICE N/A 04/10/2019   Procedure: REMOVAL OF IMPELLA LEFT VENTRICULAR ASSIST DEVICE WITH INSERTION OF RIGHT FEMORAL ARTERIAL LINE;  Surgeon: Ivin Poot, MD;  Location: [place];  Service: Open Heart Surgery;  Laterality: N/A;  . REMOVAL OF PLEURAL DRAINAGE CATHETER Left 07/02/2019   Procedure: REMOVAL OF PLEURAL DRAINAGE CATHETER;  Surgeon: Ivin Poot, MD;  Location: [place];  Service: Thoracic;  Laterality: Left;  . RIGHT/LEFT HEART CATH AND CORONARY ANGIOGRAPHY N/A 03/15/2019   Procedure: RIGHT/LEFT HEART CATH AND CORONARY ANGIOGRAPHY;  Surgeon: Burnell Blanks, MD;  Location: [place] CV LAB;  Service: Cardiovascular;  Laterality: N/A;  . TEE WITHOUT CARDIOVERSION N/A 03/16/2019   Procedure: TRANSESOPHAGEAL ECHOCARDIOGRAM (TEE);  Surgeon: Prescott Gum, Collier Salina, MD;  Location: [place];  Service: Open Heart Surgery;  Laterality: N/A;  . TRACHEOSTOMY TUBE PLACEMENT N/A 03/27/2019   Procedure: TRACHEOSTOMY placed using [place]iley 8DCT Cuffed.;  Surgeon: Wonda Olds, MD;  [facility];  Service: Thoracic;  Laterality: N/A;  . TRANSURETHRAL RESECTION OF BLADDER NECK N/A  05/13/2019   Procedure: CYSTOSCOPY CLOT EVACUATION FULGRATION CYSTOGRAM AND INSTILLATION OF FORMALIN;  Surgeon: Lucas Mallow, MD;  Location: [place];  Service: Urology;  Laterality: N/A;    Family History  Problem Relation Age of Onset  . CAD Mother 39  . CAD Father 63  . CAD Sister   . CAD Brother     Social History   Tobacco Use  . Smoking status: Former Smoker    Packs/day: 0.25    Years: 50.00    Pack years: 12.50    Types: Cigarettes    Quit date: 02/15/1990    Years since quitting: 29.8  . Smokeless tobacco: Never Used  Vaping Use  . Vaping Use: Never used  Substance Use Topics  . Alcohol use: No  . Drug use: No     Home Medications Prior to Admission medications   Medication Sig Start Date End Date Taking? Authorizing Provider  acetaminophen (TYLENOL) 500 MG tablet Take 500 mg by mouth every 6 (six) hours as needed for moderate pain.  [provider]  aspirin 81 MG chewable tablet Chew 81 mg by mouth in the morning.  05/21/19   [provider]  b complex-vitamin c-folic acid (NEPHRO-VITE) 0.8 MG TABS tablet Take 1 tablet by mouth daily.    [provider]  Calcium Carbonate-Vitamin D 600-400 MG-UNIT tablet Take 1 tablet by mouth daily. 10/08/19 12/07/19  [provider]  carboxymethylcellulose (REFRESH PLUS) 0.5 % SOLN Place 1 drop into both eyes 4 (four) times daily. 10/08/19   [provider]  docusate sodium (COLACE) 100 MG capsule Take 1 capsule (100 mg total) by mouth 2 (two) times daily. 10/17/19   Gherghe, Vella Redhead, MD  Ensure Plus (ENSURE PLUS) LIQD Take 237 mLs by mouth 2 (two) times daily between meals.    [provider]  famotidine (PEPCID) 20 MG tablet Take 20 mg by mouth daily.     [provider]  iron sucrose (VENOFER) 20 MG/ML injection Iron Sucrose (Venofer) 11/17/19 11/15/20  [provider]  midodrine (PROAMATINE) 10 MG tablet Take 10 mg by mouth 2 (two) times daily.  10/08/19  10/07/20  [provider]  midodrine (PROAMATINE) 5 MG tablet Take Every Tuesday, Thursday, Saturday 11/07/19   Ahmed Prima, Fransisco Hertz, PA-C  Nutritional Supplements (FEEDING SUPPLEMENT, NEPRO CARB STEADY,) LIQD Take 237 mLs by mouth at bedtime.    [provider]  nystatin ointment (MYCOSTATIN) Apply 1 application topically daily as needed for itching. Apply to Penis as needed if itching occurs. 06/22/19   [provider]  polyethylene glycol (MIRALAX / GLYCOLAX) 17 g packet Take 17 g by mouth 2 (two) times daily. 11/25/19   Orson Eva, MD  predniSONE (DELTASONE) 10 MG tablet Take 60 mg by mouth daily.  10/09/19   [provider]  senna (SENOKOT) 8.6 MG tablet Take 1 tablet by mouth as needed for constipation.  10/08/19 10/07/20  [provider]  sulfamethoxazole-trimethoprim (BACTRIM) 400-80 MG tablet Take 1 tablet by mouth 3 (three) times a week. Monday, Wednesday, Friday 10/08/19 12/08/19  [provider]  triamcinolone cream (KENALOG) 0.1 % Apply 1 application topically 2 (two) times daily. Apply to skin that is intact. DO NOT apply to "burn wounds". 10/08/19 10/07/20  [provider]     Allergies    Atorvastatin, Adhesive [tape], Amiodarone, Reglan [metoclopramide], and Heparin   Review of Systems   Review of Systems Unable to assess due to acuity of condition.    Physical Exam BP (!) 111/50   Pulse 89   Temp 99.4 F (37.4 C) (Oral)   Resp 20   Ht 5\' 8"  (1.727 m)   Wt 62 kg   SpO2 100%   BMI 20.78 kg/m   Physical Exam Vitals and nursing note reviewed.  Constitutional:      Appearance: Normal appearance.  HENT:     Head: Normocephalic and atraumatic.     Nose: Nose normal.     Mouth/Throat:     Mouth: Mucous membranes are moist.  Eyes:     Extraocular Movements: Extraocular movements intact.     Conjunctiva/sclera: Conjunctivae normal.  Cardiovascular:     Rate and Rhythm: Tachycardia present.  Pulmonary:      Effort: Pulmonary effort is normal.     Breath sounds: Normal breath sounds.     Comments: Dialysis access in L upper chest Abdominal:     General: Abdomen is flat.     Palpations: Abdomen is soft.     Tenderness: There is no abdominal  tenderness.  Musculoskeletal:        General: No swelling. Normal range of motion.     Cervical back: Neck supple.  Skin:    General: Skin is warm and dry.  Neurological:     General: No focal deficit present.     Mental Status: He is alert.  Psychiatric:        Mood and Affect: Mood normal.      ED Results / Procedures / Treatments   Labs (all labs ordered are listed, but only abnormal results are displayed) Labs Reviewed  CBC WITH DIFFERENTIAL/PLATELET - Abnormal; Notable for the following components:      Result Value   RBC 3.28 (*)    Hemoglobin 10.2 (*)    HCT 34.0 (*)    MCV 103.7 (*)    RDW 19.2 (*)    Neutro Abs 7.9 (*)    Lymphs Abs 0.2 (*)    All other components within normal limits  COMPREHENSIVE METABOLIC PANEL - Abnormal; Notable for the following components:   Potassium 3.1 (*)    Chloride 95 (*)    Creatinine, Ser 2.65 (*)    Calcium 7.8 (*)    Total Protein 5.3 (*)    Albumin 2.5 (*)    GFR, Estimated 21 (*)    All other components within normal limits  D-DIMER, QUANTITATIVE (NOT AT [facility]) - Abnormal; Notable for the following components:   D-Dimer, Quant 6.69 (*)    All other components within normal limits  LACTATE DEHYDROGENASE - Abnormal; Notable for the following components:   LDH 196 (*)    All other components within normal limits  FERRITIN - Abnormal; Notable for the following components:   Ferritin 1,339 (*)    All other components within normal limits  C-REACTIVE PROTEIN - Abnormal; Notable for the following components:   CRP 9.8 (*)    All other components within normal limits  BRAIN NATRIURETIC PEPTIDE - Abnormal; Notable for the following components:   B Natriuretic Peptide 1,736.0 (*)    All other  components within normal limits  TROPONIN I (HIGH SENSITIVITY) - Abnormal; Notable for the following components:   Troponin I (High Sensitivity) 47 (*)    All other components within normal limits  RESPIRATORY PANEL BY RT PCR (FLU A&B, COVID)  CULTURE, BLOOD (ROUTINE X 2)  CULTURE, BLOOD (ROUTINE X 2)  LACTIC ACID, PLASMA  PROCALCITONIN  TRIGLYCERIDES  FIBRINOGEN  LACTIC ACID, PLASMA  TROPONIN I (HIGH SENSITIVITY)    EKG EKG Interpretation  Date/Time:  Sunday December 02 2019 11:11:12 EDT Ventricular Rate:  112 PR Interval:    QRS Duration: 106 QT Interval:  295 QTC Calculation: 403 R Axis:   144 Text Interpretation: Sinus tachycardia Ventricular trigeminy Right axis deviation Probable anteroseptal infarct, old Borderline repolarization abnormality Since last tracing Rate slower Confirmed by Calvert Cantor [phone]) on 12/02/2019 12:00:15 PM   Radiology DG Chest Port 1 View  Result Date: 12/02/2019 CLINICAL DATA:  Fever, cough. EXAM: PORTABLE CHEST 1 VIEW COMPARISON:  11/19/2019 chest radiograph and prior. FINDINGS: Tunneled left IJ CVC tip overlies the right atrium. Patchy bibasilar opacities. No pneumothorax. Trace left pleural effusion, unchanged. Postsurgical appearance of the cardiomediastinal silhouette. Left lower chest pacing pads. No acute osseous abnormality. IMPRESSION: Patchy bibasilar opacities, atelectasis versus infection. Trace left pleural effusion, unchanged. Electronically Signed   By: Primitivo Gauze M.D.   On: 12/02/2019 12:02    Procedures .Cardioversion  Date/Time: 12/02/2019 11:23 AM Performed by: Calvert Cantor  B, MD Authorized by: Truddie Hidden, MD   Consent:    Consent obtained:  Verbal   Consent given by:  Patient Pre-procedure details:    Cardioversion basis:  Emergent   Rhythm:  Supraventricular tachycardia   Electrode placement:  Anterior-posterior Patient sedated: Yes. Refer to sedation procedure documentation for details of  sedation.  Attempt one:    Cardioversion mode:  Synchronous   Waveform:  Biphasic   Shock (Joules):  200   Shock outcome:  Conversion to normal sinus rhythm Post-procedure details:    Patient status:  Awake   Patient tolerance of procedure:  Tolerated well, no immediate complications .Sedation  Date/Time: 12/02/2019 11:24 AM Performed by: Truddie Hidden, MD Authorized by: Truddie Hidden, MD   Consent:    Consent obtained:  Verbal   Consent given by:  Patient   Alternatives discussed:  Analgesia without sedation Universal protocol:    Immediately prior to procedure a time out was called: yes     Patient identity confirmation method:  Verbally with patient Indications:    Procedure performed:  Cardioversion Pre-sedation assessment:    Time since last food or drink:  Unknown   NPO status caution: urgency dictates proceeding with non-ideal NPO status     ASA classification: class 3 - patient with severe systemic disease     Mallampati score:  Unable to assess   Pre-sedation assessments completed and reviewed: airway patency, cardiovascular function, mental status, respiratory function and temperature     Pre-sedation assessment completed:  12/02/2019 11:00 AM Immediate pre-procedure details:    Reassessment: Patient reassessed immediately prior to procedure     Reviewed: vital signs   Procedure details (see MAR for exact dosages):    Preoxygenation:  Nasal cannula   Sedation:  Midazolam   Intended level of sedation: moderate (conscious sedation)   Intra-procedure monitoring:  Blood pressure monitoring, cardiac monitor and continuous pulse oximetry   Intra-procedure events: none     Total Provider sedation time (minutes):  10 Post-procedure details:    Post-sedation assessment completed:  12/02/2019 11:26 AM   Attendance: Constant attendance by certified staff until patient recovered     Recovery: Patient returned to pre-procedure baseline     Post-sedation  assessments completed and reviewed: cardiovascular function, mental status, nausea/vomiting and respiratory function   .Critical Care Performed by: Truddie Hidden, MD Authorized by: Truddie Hidden, MD   Critical care provider statement:    Critical care time (minutes):  45   Critical care time was exclusive of:  Separately billable procedures and treating other patients   Critical care was necessary to treat or prevent imminent or life-threatening deterioration of the following conditions:  Circulatory failure, sepsis and cardiac failure   Critical care was time spent personally by me on the following activities:  Discussions with consultants, evaluation of patient's response to treatment, examination of patient, ordering and performing treatments and interventions, ordering and review of laboratory studies, ordering and review of radiographic studies, pulse oximetry, re-evaluation of patient's condition, obtaining history from patient or surrogate and review of old charts    Medications Ordered in the ED Medications  ceFEPIme (MAXIPIME) 2 g in sodium chloride 0.9 % 100 mL IVPB (2 g Intravenous New Bag/Given 12/02/19 1307)  azithromycin (ZITHROMAX) 500 mg in sodium chloride 0.9 % 250 mL IVPB (has no administration in time range)  vancomycin (VANCOREADY) IVPB 1500 mg/300 mL (has no administration in time range)  ceFEPIme (MAXIPIME) 2 g in sodium chloride  0.9 % 100 mL IVPB (has no administration in time range)  midazolam (VERSED) 2 MG/2ML injection (  Given 12/02/19 1108)     MDM Rules/Calculators/A&P MDM On arrival, patient with sudden onset of a rapid HR. QRS was moderately prolonged and worse than baseline but not definite wide-complex tachycardia. He had pulses and was awake but BP is low. Patient reports history of afib so this could be rapid fib. He reports compliance with his blood thinners. He has a DNR/DNI with MOST form at bedside but will give verbal consent to one attempt  at electrical cardioversion. Given Versed 2mg  and then synchronized cardioversion at 200J was successful and HR returned to baseline, BP improved.  ED Course  I have reviewed the triage vital signs and the nursing notes.  Pertinent labs & imaging results that were available during my care of the patient were reviewed by me and considered in my medical decision making (see chart for details).  Clinical Course as of Dec 02 1330  Sun Dec 02, 2019  1200 Patient awake and alert now, feeling better.    [CS]  5993 WBC is normal. Lactic acid normal. Covid/Flu neg.    [CS]  5701 CXR with patchy infiltrates, will treat for CAP. Plan admission.    [CS]  7793 CMP consistent with ESRD, other labs elevated of unclear significance in setting of ESRD but not Covid. Will discuss admission with Hospitalists.    [CS]  72 Spoke with Dr. Wynetta Emery, Hospitalist, who will see him for admission. Cardiology also consulted regarding his rapid HR/cardioversion.    [CS]  1318 Spoke with Dr. Pernell Dupre, Cardiology, who agrees patient is OK to be admitted at [facility]. He recommends Amiodarone drip if rapid rate returns.    [CS]    Clinical Course User Index [CS] Truddie Hidden, MD    Final Clinical Impression(s) / ED Diagnoses Final diagnoses:  Healthcare-associated pneumonia  Atrial fibrillation with rapid ventricular response ([place])  ESRD on hemodialysis [facility])    Rx / DC Orders ED Discharge Orders    None       Truddie Hidden, MD 12/02/19 1332

## 2019-12-03 ENCOUNTER — Ambulatory Visit (INDEPENDENT_AMBULATORY_CARE_PROVIDER_SITE_OTHER): Payer: Medicare Other | Admitting: Gastroenterology

## 2019-12-03 DIAGNOSIS — J189 Pneumonia, unspecified organism: Secondary | ICD-10-CM | POA: Diagnosis not present

## 2019-12-03 DIAGNOSIS — K626 Ulcer of anus and rectum: Secondary | ICD-10-CM

## 2019-12-03 DIAGNOSIS — Z66 Do not resuscitate: Secondary | ICD-10-CM

## 2019-12-03 DIAGNOSIS — I959 Hypotension, unspecified: Secondary | ICD-10-CM | POA: Diagnosis not present

## 2019-12-03 DIAGNOSIS — N400 Enlarged prostate without lower urinary tract symptoms: Secondary | ICD-10-CM | POA: Diagnosis not present

## 2019-12-03 DIAGNOSIS — Z9981 Dependence on supplemental oxygen: Secondary | ICD-10-CM

## 2019-12-03 DIAGNOSIS — R627 Adult failure to thrive: Secondary | ICD-10-CM | POA: Diagnosis not present

## 2019-12-03 DIAGNOSIS — I471 Supraventricular tachycardia: Secondary | ICD-10-CM

## 2019-12-03 DIAGNOSIS — D689 Coagulation defect, unspecified: Secondary | ICD-10-CM | POA: Diagnosis not present

## 2019-12-03 DIAGNOSIS — E119 Type 2 diabetes mellitus without complications: Secondary | ICD-10-CM

## 2019-12-03 LAB — CBC WITH DIFFERENTIAL/PLATELET
Abs Immature Granulocytes: 0.05 10*3/uL (ref 0.00–0.07)
Basophils Absolute: 0 10*3/uL (ref 0.0–0.1)
Basophils Relative: 0 %
Eosinophils Absolute: 0 10*3/uL (ref 0.0–0.5)
Eosinophils Relative: 0 %
HCT: 29.5 % — ABNORMAL LOW (ref 39.0–52.0)
Hemoglobin: 8.8 g/dL — ABNORMAL LOW (ref 13.0–17.0)
Immature Granulocytes: 1 %
Lymphocytes Relative: 9 %
Lymphs Abs: 0.9 10*3/uL (ref 0.7–4.0)
MCH: 31.1 pg (ref 26.0–34.0)
MCHC: 29.8 g/dL — ABNORMAL LOW (ref 30.0–36.0)
MCV: 104.2 fL — ABNORMAL HIGH (ref 80.0–100.0)
Monocytes Absolute: 0.8 10*3/uL (ref 0.1–1.0)
Monocytes Relative: 9 %
Neutro Abs: 7.5 10*3/uL (ref 1.7–7.7)
Neutrophils Relative %: 81 %
Platelets: 147 10*3/uL — ABNORMAL LOW (ref 150–400)
RBC: 2.83 MIL/uL — ABNORMAL LOW (ref 4.22–5.81)
RDW: 18.9 % — ABNORMAL HIGH (ref 11.5–15.5)
WBC: 9.3 10*3/uL (ref 4.0–10.5)
nRBC: 0 % (ref 0.0–0.2)

## 2019-12-03 LAB — BLOOD CULTURE ID PANEL (REFLEXED) - BCID2

## 2019-12-03 LAB — RENAL FUNCTION PANEL
Albumin: 2.1 g/dL — ABNORMAL LOW (ref 3.5–5.0)
Anion gap: 12 (ref 5–15)
BUN: 37 mg/dL — ABNORMAL HIGH (ref 8–23)
CO2: 28 mmol/L (ref 22–32)
Calcium: 7.6 mg/dL — ABNORMAL LOW (ref 8.9–10.3)
Chloride: 96 mmol/L — ABNORMAL LOW (ref 98–111)
Creatinine, Ser: 3.37 mg/dL — ABNORMAL HIGH (ref 0.61–1.24)
GFR, Estimated: 15 mL/min — ABNORMAL LOW (ref >60)
Glucose, Bld: 77 mg/dL (ref 70–99)
Phosphorus: 4.6 mg/dL (ref 2.5–4.6)
Potassium: 3.8 mmol/L (ref 3.5–5.1)
Sodium: 136 mmol/L (ref 135–145)

## 2019-12-03 LAB — MRSA PCR SCREENING: MRSA by PCR: NEGATIVE

## 2019-12-03 LAB — MAGNESIUM: Magnesium: 1.9 mg/dL (ref 1.7–2.4)

## 2019-12-03 MED ORDER — SULFAMETHOXAZOLE-TRIMETHOPRIM 800-160 MG PO TABS
0.5000 | ORAL_TABLET | ORAL | Status: DC
  Administered 2019-12-03: 0.5 via ORAL
  Filled 2019-12-03: qty 1

## 2019-12-03 MED ORDER — FAMOTIDINE 20 MG PO TABS
20.0000 mg | ORAL_TABLET | ORAL | Status: DC
  Administered 2019-12-04: 20 mg via ORAL
  Filled 2019-12-03: qty 1

## 2019-12-03 MED ORDER — MIDODRINE HCL 5 MG PO TABS
10.0000 mg | ORAL_TABLET | ORAL | Status: DC
  Administered 2019-12-04: 10 mg via ORAL
  Filled 2019-12-03: qty 2

## 2019-12-03 MED ORDER — CHLORHEXIDINE GLUCONATE CLOTH 2 % EX PADS
6.0000 | MEDICATED_PAD | Freq: Every day | CUTANEOUS | Status: DC
  Administered 2019-12-03: 6 via TOPICAL

## 2019-12-03 MED ORDER — POTASSIUM CHLORIDE CRYS ER 20 MEQ PO TBCR
40.0000 meq | EXTENDED_RELEASE_TABLET | Freq: Once | ORAL | Status: AC
  Administered 2019-12-03: 40 meq via ORAL
  Filled 2019-12-03: qty 2

## 2019-12-03 NOTE — Consult Note (Addendum)
Cardiology Consultation:   Patient ID: [name] MRN: [id number]; DOB: 21-Jul-1928  Admit date: 12/02/2019 Date of Consult: 12/03/2019  Primary Care Provider: Leeanne Rio, MD [facility] Cardiologist: Kate Sable, MD (Inactive) now [facility] Electrophysiologist:  None     Patient Profile:   Jesse Murphy is a 86 y.o. male with a hx of CAD, PAF, GI bleed who is being seen today for the evaluation of SVT, hypotension at the request of Dr. Wynetta Emery.  History of Present Illness:   Jesse Murphy  with past medical history of CAD (s/p PCI in 1992, CABG in 02/2019 with LIMA to LAD, SVG to OM1, SVG to PDA as well as MAZE and LAA clipping complicated by cardiogenic shock), chronic combined systolic and diastolic CHF (EF 53-97% by echo in 03/2019, 50-55% by TEE in 03/2019), ESRD, symptomatic bradycardia (occurring following CABG in 02/2019) intolerant to Beta blockers(bradycardia) and amiodarone-suspected pulm toxicity in past, HTN, HLD, and history of prostate cancer, GI bleed so no anticoagulation, Pemphigus vulgaris weaning off high dose steroids per [facility] derm  Patient came to the ER with fever and chills, nonproductive cough and malaise, suspected HCAPHe was noted to be hypoxic on arrival and then went into SVT, hypotension and dizziness. He underwent synchronized cardioversion to NSR. He has been in a rehab facility. He denies chest pain, palpitations or cardiac complaints. He didn't feel his heart racing. Has chronic hypotension and feels like too much has been taken off at dialysis.    Past Medical History:  Diagnosis Date  . Anemia   . CAD (coronary artery disease)    a. s/p PCI in 1992 b. Coronary CT in 01/2018 showing extensive coronary calcification; FFR indeterminate --> medical management pursued at that time as patient not interested in repeat cath. c. s/p CABGx3 in 02/2019.  . Cancer [facility])    prostate  . Cardiogenic shock ([place])   . CHB (complete heart  block) (HCC)    a. due to vagal event in 2021, requiring temp pacer.  . Chronic combined systolic and diastolic CHF (congestive heart failure) ([place])   . Debilitated   . Decubitus ulcer of coccyx, unspecified pressure ulcer stage   . Dyspnea   . End stage renal disease ([place])   . Esophagitis   . H/O tracheostomy    a. during admit in 2021 for respiratory failure.  . Hematuria   . Hypertension   . Hypoalbuminemia   . Ischemic cardiomyopathy   . Myocardial infarction ([place])   . PAF (paroxysmal atrial fibrillation) ([place])   . 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 Pleural effusion, left    a. s/p pleurX catheter  . Pressure ulcer of both heels   . Pseudomonas pneumonia ([place])   . Thrombocytopenia ([place])   . Walker as ambulation aid    also uses wheelchair    Past Surgical History:  Procedure Laterality Date  . BASCILIC VEIN TRANSPOSITION Right 09/10/2019   Procedure: RIGHT ARM 1ST STAGE BASCILIC VEIN TRANSPOSITION;  Surgeon: Angelia Mould, MD;  Location: [place];  Service: Vascular;  Laterality: Right;  . BASCILIC VEIN TRANSPOSITION Right 11/06/2019   Procedure: RIGHT UPPER EXTREMITY SECOND STAGE BASCILIC VEIN TRANSPOSITION;  Surgeon: Angelia Mould, MD;  Location: [place];  Service: Vascular;  Laterality: Right;  . BIOPSY  10/15/2019   Procedure: BIOPSY;  Surgeon: Daneil Dolin, MD;  [facility];  Service: Endoscopy;;  Distal Rectal Ulceration biopsies   . CHEST TUBE INSERTION Left 04/23/2019  Procedure: INSERTION PLEURAL DRAINAGE CATHETER TO DRAIN LEFT PLEURAL EFFUSION;  Surgeon: Ivin Poot, MD;  Location: [place];  Service: Thoracic;  Laterality: Left;  . CLIPPING OF ATRIAL APPENDAGE N/A 03/16/2019   Procedure: CLIPPING OF LEFT  ATRIAL APPENDAGE - USING ATRICLIP SIZE 40;  Surgeon: Ivin Poot, MD;  Location: [place];  Service: Open Heart Surgery;  Laterality: N/A;  . COLONOSCOPY N/A 08/25/2017   Procedure: COLONOSCOPY;  Surgeon: Rogene Houston, MD;  [facility];  Service: Endoscopy;  Laterality: N/A;  . CORONARY ARTERY BYPASS GRAFT N/A 03/16/2019   Procedure: CORONARY ARTERY BYPASS GRAFTING (CABG), ON PUMP, TIMES THREE, USING LEFT INTERNAL MAMMARY ARTERY, RIGHT GREATER SAPHENOUS VEIN HARVESTED ENDOSCOPICALLY;  Surgeon: Ivin Poot, MD;  Location: [place];  Service: Open Heart Surgery;  Laterality: N/A;  swan only  . CYSTOSCOPY WITH FULGERATION N/A 04/30/2019   Procedure: CYSTOSCOPY WITH FULGERATION;  Surgeon: Irine Seal, MD;  Location: [place];  Service: Urology;  Laterality: N/A;  . FLEXIBLE SIGMOIDOSCOPY N/A 10/15/2019   Procedure: FLEXIBLE SIGMOIDOSCOPY with propofol;  Surgeon: Daneil Dolin, MD;  [facility];  Service: Endoscopy;  Laterality: N/A;  . FLEXIBLE SIGMOIDOSCOPY N/A 11/24/2019   Procedure: FLEXIBLE SIGMOIDOSCOPY;  Surgeon: Harvel Quale, MD;  [facility];  Service: Gastroenterology;  Laterality: N/A;  . HERNIA REPAIR    . IR FLUORO GUIDE CV LINE LEFT  04/23/2019  . IR FLUORO GUIDE CV LINE RIGHT  05/24/2019  . IR US GUIDE VASC ACCESS LEFT  04/23/2019  . MAZE N/A 03/16/2019   Procedure: MAZE;  Surgeon: Ivin Poot, MD;  Location: [place];  Service: Open Heart Surgery;  Laterality: N/A;  . PLACEMENT OF IMPELLA LEFT VENTRICULAR ASSIST DEVICE Right 03/27/2019   Procedure: Placement Of Impella Left Ventricular Assist Device using ABIOMED Impella 5.5 with SmartAssist Device.;  Surgeon: Wonda Olds, MD;  [facility];  Service: Thoracic;  Laterality: Right;  . POLYPECTOMY  11/24/2019   Procedure: POLYPECTOMY;  Surgeon: Harvel Quale, MD;  [facility];  Service: Gastroenterology;;  sigmoid  . PROSTATE SURGERY    . REMOVAL OF IMPELLA LEFT VENTRICULAR ASSIST DEVICE N/A 04/10/2019   Procedure: REMOVAL OF IMPELLA LEFT VENTRICULAR ASSIST DEVICE WITH INSERTION OF RIGHT FEMORAL ARTERIAL LINE;  Surgeon: Ivin Poot, MD;  Location: [place];  Service: Open Heart Surgery;   Laterality: N/A;  . REMOVAL OF PLEURAL DRAINAGE CATHETER Left 07/02/2019   Procedure: REMOVAL OF PLEURAL DRAINAGE CATHETER;  Surgeon: Ivin Poot, MD;  Location: [place];  Service: Thoracic;  Laterality: Left;  . RIGHT/LEFT HEART CATH AND CORONARY ANGIOGRAPHY N/A 03/15/2019   Procedure: RIGHT/LEFT HEART CATH AND CORONARY ANGIOGRAPHY;  Surgeon: Burnell Blanks, MD;  Location: [place] CV [facility];  Service: Cardiovascular;  Laterality: N/A;  . TEE WITHOUT CARDIOVERSION N/A 03/16/2019   Procedure: TRANSESOPHAGEAL ECHOCARDIOGRAM (TEE);  Surgeon: Prescott Gum, Collier Salina, MD;  Location: [place];  Service: Open Heart Surgery;  Laterality: N/A;  . TRACHEOSTOMY TUBE PLACEMENT N/A 03/27/2019   Procedure: TRACHEOSTOMY placed using [place] 8DCT Cuffed.;  Surgeon: Wonda Olds, MD;  [facility];  Service: Thoracic;  Laterality: N/A;  . TRANSURETHRAL RESECTION OF BLADDER NECK N/A 05/13/2019   Procedure: CYSTOSCOPY CLOT EVACUATION FULGRATION CYSTOGRAM AND INSTILLATION OF FORMALIN;  Surgeon: Lucas Mallow, MD;  Location: [place];  Service: Urology;  Laterality: N/A;     Home Medications:  Prior to Admission medications  Medication Sig Start Date End Date Taking? Authorizing Provider  acetaminophen (TYLENOL) 500 MG tablet Take 500 mg by mouth every 6 (six) hours as needed for moderate pain.   Yes [provider]  aspirin 81 MG chewable tablet Chew 81 mg by mouth in the morning.  05/21/19  Yes [provider]  b complex-vitamin c-folic acid (NEPHRO-VITE) 0.8 MG TABS tablet Take 1 tablet by mouth daily.   Yes [provider]  Calcium Carbonate-Vitamin D 600-400 MG-UNIT tablet Take 1 tablet by mouth daily. 10/08/19 12/07/19 Yes [provider]  carboxymethylcellulose (REFRESH PLUS) 0.5 % SOLN Place 1 drop into both eyes 4 (four) times daily. 10/08/19  Yes [provider]  docusate sodium (COLACE) 100 MG capsule Take 1 capsule (100 mg total) by mouth 2 (two) times  daily. 10/17/19  Yes Gherghe, Vella Redhead, MD  Ensure Plus (ENSURE PLUS) LIQD Take 237 mLs by mouth 2 (two) times daily between meals.   Yes [provider]  famotidine (PEPCID) 20 MG tablet Take 20 mg by mouth daily.    Yes [provider]  midodrine (PROAMATINE) 10 MG tablet Take 10 mg by mouth 2 (two) times daily.  10/08/19 10/07/20 Yes [provider]  Nutritional Supplements (FEEDING SUPPLEMENT, NEPRO CARB STEADY,) LIQD Take 237 mLs by mouth at bedtime.   Yes [provider]  nystatin ointment (MYCOSTATIN) Apply 1 application topically daily as needed for itching. Apply to Penis as needed if itching occurs. 06/22/19  Yes [provider]  oxyCODONE-acetaminophen (PERCOCET/ROXICET) 5-325 MG tablet Take 1 tablet by mouth every 4 (four) hours as needed for severe pain.   Yes [provider]  polyethylene glycol (MIRALAX / GLYCOLAX) 17 g packet Take 17 g by mouth 2 (two) times daily. 11/25/19  Yes Tat, Shanon Brow, MD  predniSONE (DELTASONE) 10 MG tablet Take 40 mg by mouth daily.  02/10/2020  Yes [provider]  predniSONE (DELTASONE) 10 MG tablet Take 50 mg by mouth daily with breakfast. 12/26/19 02/01/2020 Yes [provider]  predniSONE (DELTASONE) 10 MG tablet Take 60 mg by mouth daily with breakfast. 11/26/19 12/26/19 Yes [provider]  senna (SENOKOT) 8.6 MG tablet Take 1 tablet by mouth as needed for constipation.  10/08/19 10/07/20 Yes [provider]  sulfamethoxazole-trimethoprim (BACTRIM) 400-80 MG tablet Take 1 tablet by mouth 3 (three) times a week. Monday, Wednesday, Friday 10/08/19 12/08/19 Yes [provider]  triamcinolone cream (KENALOG) 0.1 % Apply 1 application topically 2 (two) times daily. Apply to skin that is intact. DO NOT apply to "burn wounds". 10/08/19 10/07/20 Yes [provider]  iron sucrose (VENOFER) 20 MG/ML injection Iron Sucrose (Venofer) 11/17/19 11/15/20  [provider]    midodrine (PROAMATINE) 5 MG tablet Take Every Tuesday, Thursday, Saturday Patient taking differently: Take Every Monday, Wednesday, Friday 11/07/19   Erma Heritage, PA-C    Inpatient Medications: Scheduled Meds: . aspirin  81 mg Oral q AM  . calcium-vitamin D  1 tablet Oral Daily  . docusate sodium  100 mg Oral BID  . [START ON 12/04/2019] famotidine  20 mg Oral Q T,Th,Sa-HD  . feeding supplement  237 mL Oral BID BM  . feeding supplement (NEPRO CARB STEADY)  237 mL Oral QHS  . midodrine  10 mg Oral BID  . midodrine  5 mg Oral Q M,W,F-HD  . multivitamin  1 tablet Oral QHS  . polyethylene glycol  17 g Oral BID  . polyvinyl alcohol  1 drop  Both Eyes QID  . predniSONE  60 mg Oral Q breakfast  . sulfamethoxazole-trimethoprim  0.5 tablet Oral Q M,W,F  . triamcinolone cream  1 application Topical BID   Continuous Infusions: . [START ON 12/04/2019] ceFEPime (MAXIPIME) IV    . [START ON 12/04/2019] vancomycin     PRN Meds: oxyCODONE-acetaminophen, senna  Allergies:    Allergies  Allergen Reactions  . Atorvastatin Anaphylaxis and Rash    Leg pain  . Adhesive [Tape]     Tears skin  . Amiodarone     Rash (see 05/2019 hospital notes), but was on other medications at the time so unclear which agent   . Reglan [Metoclopramide]     Rash (see 05/2019 hospital notes), but was on other medications at the time so unclear which agent  . Heparin Rash    Thrombocytopenia/ HIT    Social History:   Social History   Socioeconomic History  . Marital status: Married    Spouse name: Not on file  . Number of children: 2  . Years of education: Not on file  . Highest education level: Not on file  Occupational History  . Occupation: Librarian, academic in supply    Comment: [employer]  Tobacco Use  . Smoking status: Former Smoker    Packs/day: 0.25    Years: 50.00    Pack years: 12.50    Types: Cigarettes    Quit date: 02/15/1990    Years since quitting: 29.8  . Smokeless tobacco: Never Used   Vaping Use  . Vaping Use: Never used  Substance and Sexual Activity  . Alcohol use: No  . Drug use: No  . Sexual activity: Not Currently  Other Topics Concern  . Not on file  Social History Narrative  . Not on file   Social Determinants of Health   Financial Resource Strain:   . Difficulty of Paying Living Expenses: Not on file  Food Insecurity:   . Worried About Charity fundraiser in the Last Year: Not on file  . Ran Out of Food in the Last Year: Not on file  Transportation Needs:   . Lack of Transportation (Medical): Not on file  . Lack of Transportation (Non-Medical): Not on file  Physical Activity:   . Days of Exercise per Week: Not on file  . Minutes of Exercise per Session: Not on file  Stress:   . Feeling of Stress : Not on file  Social Connections:   . Frequency of Communication with Friends and Family: Not on file  . Frequency of Social Gatherings with Friends and Family: Not on file  . Attends Religious Services: Not on file  . Active Member of Clubs or Organizations: Not on file  . Attends Archivist Meetings: Not on file  . Marital Status: Not on file  Intimate Partner Violence:   . Fear of Current or Ex-Partner: Not on file  . Emotionally Abused: Not on file  . Physically Abused: Not on file  . Sexually Abused: Not on file    Family History:     Family History  Problem Relation Age of Onset  . CAD Mother 59  . CAD Father 35  . CAD Sister   . CAD Brother      ROS:  Please see the history of present illness.  Review of Systems  Constitutional: Positive for chills, fever and malaise/fatigue.  HENT: Negative.   Cardiovascular: Positive for dyspnea on exertion.  Respiratory: Positive for cough and shortness of  breath.   Endocrine: Negative.   Hematologic/Lymphatic: Negative.   Skin: Positive for poor wound healing.  Musculoskeletal: Negative.   Gastrointestinal: Negative.   Genitourinary: Negative.   Neurological: Positive for  weakness.    All other ROS reviewed and negative.     Physical Exam/Data:   Vitals:   12/02/19 1830 12/02/19 2100 12/02/19 2342 12/03/19 0347  BP: (!) 115/50 (!) 98/47 (!) 110/52 133/60  Pulse:  85 92 85  Resp: (!) 29 19 18 18   Temp:  98.5 F (36.9 C) 97.9 F (36.6 C) 97.9 F (36.6 C)  TempSrc:  Oral Oral Oral  SpO2: 100% 99% 100% 100%  Weight:   62.6 kg   Height:   5\' 8"  (1.727 m)     Intake/Output Summary (Last 24 hours) at 12/03/2019 0845 Last data filed at 12/03/2019 0011 Gross per 24 hour  Intake 770 ml  Output --  Net 770 ml   Last 3 Weights 12/02/2019 12/02/2019 11/25/2019  Weight (lbs) 138 lb 0.1 oz 136 lb 11 oz 136 lb 11 oz  Weight (kg) 62.6 kg 62 kg 62 kg     Body mass index is 20.98 kg/m.  General:  Thin, in no acute distress HEENT: normal Lymph: no adenopathy Neck: no JVD Endocrine:  No thryomegaly Vascular: No carotid bruits; FA pulses 2+ bilaterally without bruits  Cardiac:  normal S1, S2; RRR; 1/6 systolic murmur LSB Lungs:  Decreased breath sounds, no rales Abd: soft, nontender, no hepatomegaly  Ext: no edema-poor wound healing Musculoskeletal:  No deformities, BUE and BLE strength normal and equal Skin: warm and dry  Neuro:  CNs 2-12 intact, no focal abnormalities noted Psych:  Normal affect   EKG:  The EKG was personally reviewed and demonstrates:  SVT 168/m f/u Sinus tachy with old antsept infarct nonspecific ST changes. Telemetry:  Telemetry was personally reviewed and demonstrates: NSR, freq PAC's, readout says Afib at times but looks like he has P waves  Relevant CV Studies:   Cardiac Catheterization: 02/2019  Dist LM to Ost LAD lesion is 80% stenosed.  Ost LM to Dist LM lesion is 50% stenosed.  Mid Cx lesion is 30% stenosed.  Prox RCA-1 lesion is 60% stenosed.  Prox RCA-2 lesion is 99% stenosed.   1. Severe calcific stenosis in the distal left main, ostial LAD and mid RCA 2. Elevated filling pressures   Recommendations: He  has a severe, heavily calcified stenosis in the mid RCA. He also has a heavily calcified distal left main, ostial LAD stenosis. Revascularization with PCI would be high risk and require atherectomy in the ostial LAD extending back into the left main with chance of obstruction of flow into the Circumflex. He is 86 years old and would be high risk for CABG as well but he is overall a very functional patient for his age. I will ask CT surgery to see him to discuss CABG. He will need optimization of his heart rate control and diuresis. I will start Lasix 40 mg IV BID.      Echocardiogram: 08/2019 IMPRESSIONS     1. Left ventricular ejection fraction, by estimation, is 55 to 60%. The  left ventricle has normal function. Left ventricular endocardial border  not optimally defined to evaluate regional wall motion. Left ventricular  diastolic parameters were normal.   2. Right ventricular systolic function is normal. The right ventricular  size is normal. There is normal pulmonary artery systolic pressure. The  estimated right ventricular systolic pressure is 33.5  mmHg.   3. The mitral valve is abnormal, mildly thickened and calcified with  moderate annular calcification. Trivial mitral valve regurgitation.   4. The aortic valve is tricuspid. Aortic valve regurgitation is not  visualized. Mild to moderate aortic valve sclerosis/calcification is  present, without any evidence of aortic stenosis.   5. The inferior vena cava is normal in size with greater than 50%  respiratory variability, suggesting right atrial pressure of 3 mmHg.     Laboratory Data:  High Sensitivity Troponin:   Recent Labs  Lab 12/02/19 1133 12/02/19 1326  TROPONINIHS 47* 47*     Chemistry Recent Labs  Lab 12/02/19 1133 12/03/19 0637  NA 137 136  K 3.1* 3.8  CL 95* 96*  CO2 29 28  GLUCOSE 86 77  BUN 22 37*  CREATININE 2.65* 3.37*  CALCIUM 7.8* 7.6*  GFRNONAA 21* 15*  ANIONGAP 13 12    Recent Labs  Lab  12/02/19 1133 12/03/19 0637  PROT 5.3*  --   ALBUMIN 2.5* 2.1*  AST 17  --   ALT 14  --   ALKPHOS 64  --   BILITOT 0.7  --    Hematology Recent Labs  Lab 12/02/19 1133 12/03/19 0637  WBC 8.9 9.3  RBC 3.28* 2.83*  HGB 10.2* 8.8*  HCT 34.0* 29.5*  MCV 103.7* 104.2*  MCH 31.1 31.1  MCHC 30.0 29.8*  RDW 19.2* 18.9*  PLT 156 147*   BNP Recent Labs  Lab 12/02/19 1133  BNP 1,736.0*    DDimer  Recent Labs  Lab 12/02/19 1133  DDIMER 6.69*     Radiology/Studies:  DG Chest Port 1 View  Result Date: 12/02/2019 CLINICAL DATA:  Fever, cough. EXAM: PORTABLE CHEST 1 VIEW COMPARISON:  11/19/2019 chest radiograph and prior. FINDINGS: Tunneled left IJ CVC tip overlies the right atrium. Patchy bibasilar opacities. No pneumothorax. Trace left pleural effusion, unchanged. Postsurgical appearance of the cardiomediastinal silhouette. Left lower chest pacing pads. No acute osseous abnormality. IMPRESSION: Patchy bibasilar opacities, atelectasis versus infection. Trace left pleural effusion, unchanged. Electronically Signed   By: Primitivo Gauze M.D.   On: 12/02/2019 12:02     Assessment and Plan:   SVT associated with hypotension in the setting of pneumonia and recent high dose steroids currently being weaned underwent synchronized cardioversion in ED to NSR. Continue to monitor. No slow HR's but history of bradycardia on beta blockers in the past  PAF patient doesn't know that he's had recently. On ASA alone because of GI bleed-Hbg 8.8. Amiodarone stopped in past b/c of suspected pulmonary toxicity   CAD -  s/p CABG in 02/2019 with LIMA to LAD, SVG to OM1, SVG to PDA as well as MAZE and LAA clipping. - Medical therapy has been limited secondary to his multiple medication intolerances and chronic hypotension Remains on ASA 81mg  daily. Not on a BB due to prior bradycardia and previously developed a significant rash while on statin therapy. Would not re-challenge at this time given  his recent admission for pemphigus vulgaris. LDL was at 74 in 02/2019 which is close to goal. No angina    History of Ischemic Cardiomyopathy - EF was previously 25-30% by echo in 03/2019, normalized to 55-60% by repeat echocardiogram in 08/2019. No longer on a BB given prior bradycardia and he has not been on an ACE-I/ARB/ARNI due to CKD.    Hypotension - He is on Midodrine by Nephrology      ESRD on HD   For questions or updates,  please contact [place] Please consult [email or web address] for contact info under    Signed, Ermalinda Barrios, PA-C  12/03/2019 8:45 AM   Attending note Patient seen and discussed with PA Bonnell Public, I agree with her documentation. 86 yo male history of CADs/p PCI in 1992, CABG in 02/2019 withLIMA to LAD,SVG to OM1,SVG to PDA as well as MAZE and LAA clipping complicated by cardiogenic shock), chronic combined systolic and diastolic CHF (EF 33-38% by echo in 03/2019, 50-55% by TEE in 03/2019), ESRD, chronic hypotension on midodrine, PAF not on anticoag due to GI bleeds per H&P however cardiology note from [place] mentions also hematuria, admitted with cough and fever. Admitted with HCAP by primary team. In ER issues with SVT, cardiology consulted  From ER notes sudden onset of symptomatic SVT and low bp's. Electrically cardioverted. EKG shows SVT at 170, mild aberrancy    Lactic acid 1.6 WBC 8.9 Hgb 10.2 Plt 156 K 3.1 Cr 2.65 BUN 22 BNP 1736 hstrop 47-->47 COVID neg   Presented with pneumonia, SVT in ER requiring cardioversion. SVT occurred in setting of systemic illness, hypokalemia. Keep K at 4 Mg 2. Notes mention prior bradycardia on beta blocker in the past. 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  From notes prior rash unclear if related to statin or amio.   Given prior  vagal event with severe bradycardia, chronic hypotension on midodrine would retry amiodarone if has recurrent issues with SVT this admission. HOpefully just triggered by hypokalemia and systemic stress from pneumonia. 01/31/2018 notes mention amio stopped due to dyspnea, I do not see a formal diagnosis of amio toxicity made at the time as not PFTs completed.    Tele from overnight remains SR  We will monitor tele tomorrow.If recurrent issues start amiodarone.   Carlyle Dolly MD

## 2019-12-03 NOTE — Progress Notes (Signed)
CRITICAL VALUE ALERT  Critical Value:  Gram + Cocci in blood culture bottles  Date & Time Notied:  12/03/2019 @ 9539  Provider Notified: MD Wynetta Emery  Orders Received/Actions taken: non at this time

## 2019-12-03 NOTE — TOC Initial Note (Signed)
Transition of Care [facility]) - Initial/Assessment Note    Patient Details  Name: Jesse Murphy MRN: [id number] Date of Birth: Jun 13, 1928  Transition of Care [facility]) CM/SW Contact:    Ihor Gully, LCSW Phone Number: 12/03/2019, 3:25 PM  Clinical Narrative:                 Patient admitted from [facility]. Admitted for HCAP (healthcare-associated pneumonia). At [facility] for short term rehab. Was expected to discharge on this coming Friday. Patient can return to complete rehab. Patient feels he may want to [facility] rather than rehab. TOC will follow and address needs as identified.   Expected Discharge Plan: Skilled Nursing Facility Barriers to Discharge: Continued Medical Work up   Patient Goals and CMS Choice        Expected Discharge Plan and Services Expected Discharge Plan: [place] Acute Care Choice: [place] arrangements for the past 2 months: Single Family Home                                      Prior Living Arrangements/Services Living arrangements for the past 2 months: Single Family Home Lives with:: Spouse Patient language and need for interpreter reviewed:: Yes Do you feel safe going back to the place where you live?: Yes      Need for Family Participation in Patient Care: Yes (Comment) Care giver support system in place?: Yes (comment)   Criminal Activity/Legal Involvement Pertinent to Current Situation/Hospitalization: No - Comment as needed  Activities of Daily Living Home Assistive Devices/Equipment: Eyeglasses, Dentures (specify type), Wheelchair, Environmental consultant (specify type) ADL Screening (condition at time of admission) Patient's cognitive ability adequate to safely complete daily activities?: Yes Is the patient deaf or have difficulty hearing?: No Does the patient have difficulty seeing, even when wearing glasses/contacts?: No Does the patient have difficulty concentrating, remembering, or making  decisions?: No Patient able to express need for assistance with ADLs?: Yes Does the patient have difficulty dressing or bathing?: Yes Independently performs ADLs?: No Communication: Independent Dressing (OT): Needs assistance Is this a change from baseline?: Pre-admission baseline Grooming: Needs assistance Is this a change from baseline?: Pre-admission baseline Feeding: Independent Bathing: Needs assistance Is this a change from baseline?: Pre-admission baseline Toileting: Needs assistance Is this a change from baseline?: Pre-admission baseline In/Out Bed: Needs assistance Is this a change from baseline?: Pre-admission baseline Walks in Home: Needs assistance Is this a change from baseline?: Pre-admission baseline Does the patient have difficulty walking or climbing stairs?: Yes Weakness of Legs: Both Weakness of Arms/Hands: Both  Permission Sought/Granted Permission sought to share information with : Facility Theatre stage manager Information with NAME: Jackelyn Poling  Permission granted to share info w AGENCY: [facility]        Emotional Assessment Appearance:: Appears stated age   Affect (typically observed): Appropriate Orientation: : Oriented to Self, Oriented to Place, Oriented to  Time, Oriented to Situation Alcohol / Substance Use: Not Applicable Psych Involvement: No (comment)  Admission diagnosis:  Healthcare-associated pneumonia [J18.9] Atrial fibrillation with rapid ventricular response (HCC) [I48.91] ESRD on hemodialysis ([place]) [N18.6, Z99.2] HCAP (healthcare-associated pneumonia) [J18.9] Patient Active Problem List   Diagnosis Date Noted  . HCAP (healthcare-associated pneumonia) 12/02/2019  . Ischemic cardiomyopathy   . History of Pseudomonas pneumonia ([place])   . Stercoral ulcer of rectum   . DNR (do  not resuscitate)   . Systolic and diastolic CHF, chronic ([place]) 11/24/2019  . Acute blood loss anemia 11/23/2019  . Adverse effect of antihyperlipidemic and  antiarteriosclerotic drugs, initial encounter 11/07/2019  . Pure hypercholesterolemia 11/07/2019  . Skin desquamation 11/07/2019  . Gastrointestinal hemorrhage, unspecified 10/18/2019  . Advanced care planning/counseling discussion   . End stage renal disease on dialysis ([place])   . Palliative care by specialist   . Goals of care, counseling/discussion   . Rectal bleeding 10/13/2019  . Inguinal hernia of left side without obstruction or gangrene   . 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 History of atrial fibrillation- treated surgically, no longer has it per the patient 10/01/2019  . Personal history of prostate cancer 10/01/2019  . Stevens-Johnson syndrome ([place]) 10/01/2019  . Abnormality of gait 09/24/2019  . Hypothyroidism, unspecified 09/19/2019  . Anaphylactic shock, unspecified, initial encounter 09/05/2019  . History of pleural effusion 08/22/2019  . Recurrent left pleural effusion 07/11/2019  . Hematuria 07/09/2019  . Postop check 06/27/2019  . Unspecified protein-calorie malnutrition ([place]) 06/22/2019  . Coagulation defect, unspecified ([place]) 06/19/2019  . AKI (acute kidney injury) ([place]) 06/18/2019  . Anemia in chronic kidney disease 06/14/2019  . Cardiogenic shock ([place]) 06/14/2019  . Coronary angioplasty status 06/14/2019  . Diarrhea, unspecified 06/14/2019  . Iron deficiency anemia, unspecified 06/14/2019  . Pain, unspecified 06/14/2019  . Pruritus, unspecified 06/14/2019  . Secondary hyperparathyroidism of renal origin ([place]) 06/14/2019  . Adult failure to thrive   . History of non-Hodgkin's lymphoma   . Chest tube in place   . Failure to thrive (child)   . Prediabetes   . Hypoalbuminemia due to protein-calorie malnutrition ([place])   . Failure to thrive in adult   . Allergic contact dermatitis   . ESRD on dialysis ([place])   . Dysphagia   . Thrombocytopenia ([place])   . Acute  on chronic anemia   . Supplemental oxygen dependent   . Acute systolic congestive heart failure ([place])   . Pressure ulcer 05/23/2019  . Debility 05/22/2019  . Palliative care encounter   . Pressure injury of skin 03/25/2019  . Acute respiratory failure with hypoxia ([place])   . S/P CABG x 3 03/16/2019  . Non-ST elevation (NSTEMI) myocardial infarction ([place])   . Acute on chronic combined systolic and diastolic CHF (congestive heart failure) ([place])   . Coronary artery disease due to lipid rich plaque   . Atrial fibrillation with RVR ([place]) 03/14/2019  . Atrial fibrillation with rapid ventricular response ([place]) 03/13/2019  . Chest pain 09/12/2018  . Chronic diastolic heart failure ([place]) 05/29/2018  . Hypertension 05/29/2018  . Non Hodgkin's lymphoma ([place]) 09/30/2017  . Heme positive stool 08/11/2017  . Chronic anticoagulation 07/05/2016  . CAD S/P percutaneous coronary angioplasty 07/05/2016  . Type 2 diabetes mellitus without complications ([place]) 94/85/4627  . PAF (paroxysmal atrial fibrillation) ([place]) 06/14/2016  . Chest pain in adult 06/14/2016  . Dyspnea on exertion 06/14/2016  . Allergic rhinitis 06/14/2016  . GERD (gastroesophageal reflux disease) 06/14/2016  . BPH (benign prostatic hyperplasia) 06/14/2016   PCP:  Leeanne Rio, MD Pharmacy:  No Pharmacies Listed    Social Determinants of Health (SDOH) Interventions    Readmission Risk Interventions Readmission Risk Prevention Plan 11/25/2019  Transportation Screening Complete  PCP or Specialist Appt within 3-5 Days Complete  HRI or [place] Complete  Social Work Consult for [place] Planning/Counseling  Complete  Palliative Care Screening Complete  Medication Review (RN Care Manager) Complete  Some recent data might be hidden

## 2019-12-03 NOTE — NC FL2 (Signed)
[place] MEDICAID FL2 LEVEL OF CARE SCREENING TOOL     IDENTIFICATION  Patient Name: Jesse Murphy Birthdate: 25-Sep-1928 Sex: male Admission Date (Current Location): 12/02/2019  [facility] and [place] Number:  [employer] and Address:  [place] [address] [place]      Provider Number: [id number]  Attending Physician Name and Address:  Murlean Iba, MD  Relative Name and Phone Number:  Crewe, Heathman     [phone]    Current Level of Care: Hospital Recommended Level of Care: [place] Prior Approval Number:    Date Approved/Denied:   PASRR Number:    Discharge Plan: SNF    Current Diagnoses: Patient Active Problem List   Diagnosis Date Noted  . HCAP (healthcare-associated pneumonia) 12/02/2019  . Ischemic cardiomyopathy   . History of Pseudomonas pneumonia ([place])   . Stercoral ulcer of rectum   . DNR (do not resuscitate)   . Systolic and diastolic CHF, chronic ([place]) 11/24/2019  . Acute blood loss anemia 11/23/2019  . Adverse effect of antihyperlipidemic and antiarteriosclerotic drugs, initial encounter 11/07/2019  . Pure hypercholesterolemia 11/07/2019  . Skin desquamation 11/07/2019  . Gastrointestinal hemorrhage, unspecified 10/18/2019  . Advanced care planning/counseling discussion   . End stage renal disease on dialysis ([place])   . Palliative care by specialist   . Goals of care, counseling/discussion   . Rectal bleeding 10/13/2019  . Inguinal hernia of left side without obstruction or gangrene   . 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 History of atrial fibrillation- treated surgically, no longer has it per the patient 10/01/2019  . Personal history of prostate cancer 10/01/2019  . Stevens-Johnson syndrome ([place]) 10/01/2019  . Abnormality of gait 09/24/2019  . Hypothyroidism, unspecified  09/19/2019  . Anaphylactic shock, unspecified, initial encounter 09/05/2019  . History of pleural effusion 08/22/2019  . Recurrent left pleural effusion 07/11/2019  . Hematuria 07/09/2019  . Postop check 06/27/2019  . Unspecified protein-calorie malnutrition ([place]) 06/22/2019  . Coagulation defect, unspecified ([place]) 06/19/2019  . AKI (acute kidney injury) ([place]) 06/18/2019  . Anemia in chronic kidney disease 06/14/2019  . Cardiogenic shock ([place]) 06/14/2019  . Coronary angioplasty status 06/14/2019  . Diarrhea, unspecified 06/14/2019  . Iron deficiency anemia, unspecified 06/14/2019  . Pain, unspecified 06/14/2019  . Pruritus, unspecified 06/14/2019  . Secondary hyperparathyroidism of renal origin ([place]) 06/14/2019  . Adult failure to thrive   . History of non-Hodgkin's lymphoma   . Chest tube in place   . Failure to thrive (child)   . Prediabetes   . Hypoalbuminemia due to protein-calorie malnutrition ([place])   . Failure to thrive in adult   . Allergic contact dermatitis   . ESRD on dialysis ([place])   . Dysphagia   . Thrombocytopenia ([place])   . Acute on chronic anemia   . Supplemental oxygen dependent   . Acute systolic congestive heart failure ([place])   . Pressure ulcer 05/23/2019  . Debility 05/22/2019  . Palliative care encounter   . Pressure injury of skin 03/25/2019  . Acute respiratory failure with hypoxia ([place])   . S/P CABG x 3 03/16/2019  . Non-ST elevation (NSTEMI) myocardial infarction ([place])   . Acute on chronic combined systolic and diastolic CHF (congestive heart failure) ([place])   . Coronary artery disease due to lipid rich plaque   . Atrial fibrillation with RVR ([place]) 03/14/2019  .  Atrial fibrillation with rapid ventricular response ([place]) 03/13/2019  . Chest pain 09/12/2018  . Chronic diastolic heart failure ([place]) 05/29/2018  . Hypertension 05/29/2018  . Non Hodgkin's lymphoma ([place]) 09/30/2017  . Heme positive stool 08/11/2017  . Chronic anticoagulation 07/05/2016  .  CAD S/P percutaneous coronary angioplasty 07/05/2016  . Type 2 diabetes mellitus without complications ([place]) 11/94/1740  . PAF (paroxysmal atrial fibrillation) ([place]) 06/14/2016  . Chest pain in adult 06/14/2016  . Dyspnea on exertion 06/14/2016  . Allergic rhinitis 06/14/2016  . GERD (gastroesophageal reflux disease) 06/14/2016  . BPH (benign prostatic hyperplasia) 06/14/2016    Orientation RESPIRATION BLADDER Height & Weight     Self, Time, Situation, Place  O2 (2L) Incontinent Weight: 138 lb 0.1 oz (62.6 kg) Height:  5\' 8"  (172.7 cm)  BEHAVIORAL SYMPTOMS/MOOD NEUROLOGICAL BOWEL NUTRITION STATUS      Incontinent  (renal with fluid restriction. Fluid restriction 1248ml fluid)  AMBULATORY STATUS COMMUNICATION OF NEEDS Skin   Limited Assist Verbally Other (Comment) (heel posterior left; right open wound)                       Personal Care Assistance Level of Assistance  Bathing, Dressing, Feeding Bathing Assistance: Limited assistance Feeding assistance: Independent Dressing Assistance: Limited assistance     Functional Limitations Info  Sight, Hearing, Speech Sight Info: Adequate Hearing Info: Adequate Speech Info: Adequate    SPECIAL CARE FACTORS FREQUENCY  PT (By licensed PT)     PT Frequency: 5x/week              Contractures Contractures Info: Not present    Additional Factors Info  Code Status, Allergies Code Status Info: DNR Allergies Info: Atorvastatin, Athesive tape, Amiodarone, Reglan, Heparin           Current Medications (12/03/2019):  This is the current hospital active medication list Current Facility-Administered Medications  Medication Dose Route Frequency Provider Last Rate Last Admin  . aspirin chewable tablet 81 mg  81 mg Oral q AM Johnson, Clanford L, MD   81 mg at 12/03/19 1041  . calcium-vitamin D (OSCAL WITH D) 500-200 MG-UNIT per tablet 1 tablet  1 tablet Oral Daily Johnson, Clanford L, MD   1 tablet at 12/03/19 1035  . [START  ON 12/04/2019] ceFEPIme (MAXIPIME) 2 g in sodium chloride 0.9 % 100 mL IVPB  2 g Intravenous Q T,Th,Sa-HD Johnson, Clanford L, MD      . Chlorhexidine Gluconate Cloth 2 % PADS 6 each  6 each Topical Q0600 Donato Heinz, MD   6 each at 12/03/19 1038  . docusate sodium (COLACE) capsule 100 mg  100 mg Oral BID Johnson, Clanford L, MD   100 mg at 12/03/19 1035  . [START ON 12/04/2019] famotidine (PEPCID) tablet 20 mg  20 mg Oral Q T,Th,Sa-HD Johnson, Clanford L, MD      . feeding supplement (ENSURE ENLIVE / ENSURE PLUS) liquid 237 mL  237 mL Oral BID BM Johnson, Clanford L, MD   237 mL at 12/03/19 1038  . feeding supplement (NEPRO CARB STEADY) liquid 237 mL  237 mL Oral QHS Johnson, Clanford L, MD      . midodrine (PROAMATINE) tablet 10 mg  10 mg Oral BID Johnson, Clanford L, MD   10 mg at 12/03/19 1035  . [START ON 12/04/2019] midodrine (PROAMATINE) tablet 10 mg  10 mg Oral Q T,Th,Sa-HD Donato Heinz, MD      . multivitamin (RENA-VIT) tablet 1 tablet  1  tablet Oral QHS Wynetta Emery, Clanford L, MD   1 tablet at 12/02/19 2345  . oxyCODONE-acetaminophen (PERCOCET/ROXICET) 5-325 MG per tablet 1 tablet  1 tablet Oral Q4H PRN Johnson, Clanford L, MD      . polyethylene glycol (MIRALAX / GLYCOLAX) packet 17 g  17 g Oral BID Johnson, Clanford L, MD   17 g at 12/03/19 1038  . polyvinyl alcohol (LIQUIFILM TEARS) 1.4 % ophthalmic solution 1 drop  1 drop Both Eyes QID Johnson, Clanford L, MD   1 drop at 12/03/19 1038  . predniSONE (DELTASONE) tablet 60 mg  60 mg Oral Q breakfast Johnson, Clanford L, MD   60 mg at 12/03/19 1035  . senna (SENOKOT) tablet 8.6 mg  1 tablet Oral PRN Johnson, Clanford L, MD      . sulfamethoxazole-trimethoprim (BACTRIM DS) 800-160 MG per tablet 0.5 tablet  0.5 tablet Oral Q M,W,F Johnson, Clanford L, MD   0.5 tablet at 12/03/19 1036  . triamcinolone cream (KENALOG) 0.1 % 1 application  1 application Topical BID Johnson, Clanford L, MD      . Derrill Memo ON 12/04/2019] vancomycin  (VANCOREADY) IVPB 750 mg/150 mL  750 mg Intravenous Q T,Th,Sa-HD Johnson, Clanford L, MD         Discharge Medications: Please see discharge summary for a list of discharge medications.  Relevant Imaging Results:  Relevant Lab Results:   Additional Information SSN: [id number]  Ihor Gully, LCSW

## 2019-12-03 NOTE — Consult Note (Signed)
[facility] Renal Consultation Note    Indication for Consultation:  Management of ESRD/hemodialysis; anemia, hypertension/volume and secondary hyperparathyroidism  HPI: Jesse Murphy is a 86 y.o. male with PMH significant for CAD, ESRD on HD TTS, P. 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  He presented to [facility] ED on 12/02/19 with fevers and nonproductive cough.  In the ED he was hypoxic, febrile, and tachycardic with HR up to 174 and hypotension at 80/56.  ECG consistent with SVT and he underwent synchronized cardioversion in the ED with transition to sinus rhythm.  He was also found to have infiltrates on CXR and was started on IV antibiotics for HCAP.  We were consulted to provide HD during his hospitalization.    Past Medical History:  Diagnosis Date  . Anemia   . CAD (coronary artery disease)    a. s/p PCI in 1992 b. Coronary CT in 01/2018 showing extensive coronary calcification; FFR indeterminate --> medical management pursued at that time as patient not interested in repeat cath. c. s/p CABGx3 in 02/2019.  . Cancer [facility])    prostate  . Cardiogenic shock ([place])   . CHB (complete heart block) (HCC)    a. due to vagal event in 2021, requiring temp pacer.  . Chronic combined systolic and diastolic CHF (congestive heart failure) ([employer])   . Debilitated   . Decubitus ulcer of coccyx, unspecified pressure ulcer stage   . Dyspnea   . End stage renal disease ([place])   . Esophagitis   . H/O tracheostomy    a. during admit in 2021 for respiratory failure.  . Hematuria   . Hypertension   . Hypoalbuminemia   . Ischemic cardiomyopathy   . Myocardial infarction ([place])   . PAF (paroxysmal atrial fibrillation) ([place])   . 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 Pleural effusion, left    a. s/p pleurX catheter  . Pressure ulcer of both heels   . Pseudomonas pneumonia ([place])   .  Thrombocytopenia ([place])   . Walker as ambulation aid    also uses wheelchair   Past Surgical History:  Procedure Laterality Date  . BASCILIC VEIN TRANSPOSITION Right 09/10/2019   Procedure: RIGHT ARM 1ST STAGE BASCILIC VEIN TRANSPOSITION;  Surgeon: Angelia Mould, MD;  Location: [place];  Service: Vascular;  Laterality: Right;  . BASCILIC VEIN TRANSPOSITION Right 11/06/2019   Procedure: RIGHT UPPER EXTREMITY SECOND STAGE BASCILIC VEIN TRANSPOSITION;  Surgeon: Angelia Mould, MD;  Location: [place];  Service: Vascular;  Laterality: Right;  . BIOPSY  10/15/2019   Procedure: BIOPSY;  Surgeon: Daneil Dolin, MD;  [facility];  Service: Endoscopy;;  Distal Rectal Ulceration biopsies   . CHEST TUBE INSERTION Left 04/23/2019   Procedure: INSERTION PLEURAL DRAINAGE CATHETER TO DRAIN LEFT PLEURAL EFFUSION;  Surgeon: Ivin Poot, MD;  Location: [place];  Service: Thoracic;  Laterality: Left;  . CLIPPING OF ATRIAL APPENDAGE N/A 03/16/2019   Procedure: CLIPPING OF LEFT  ATRIAL APPENDAGE - USING ATRICLIP SIZE 40;  Surgeon: Ivin Poot, MD;  Location: [place];  Service: Open Heart Surgery;  Laterality: N/A;  . COLONOSCOPY N/A 08/25/2017   Procedure: COLONOSCOPY;  Surgeon: Rogene Houston, MD;  [facility];  Service: Endoscopy;  Laterality: N/A;  . CORONARY ARTERY BYPASS GRAFT N/A 03/16/2019   Procedure: CORONARY ARTERY BYPASS GRAFTING (CABG), ON PUMP, TIMES THREE, USING LEFT INTERNAL  MAMMARY ARTERY, RIGHT GREATER SAPHENOUS VEIN HARVESTED ENDOSCOPICALLY;  Surgeon: Ivin Poot, MD;  Location: [place];  Service: Open Heart Surgery;  Laterality: N/A;  swan only  . CYSTOSCOPY WITH FULGERATION N/A 04/30/2019   Procedure: CYSTOSCOPY WITH FULGERATION;  Surgeon: Irine Seal, MD;  Location: [place];  Service: Urology;  Laterality: N/A;  . FLEXIBLE SIGMOIDOSCOPY N/A 10/15/2019   Procedure: FLEXIBLE SIGMOIDOSCOPY with propofol;  Surgeon: Daneil Dolin, MD;  [facility];   Service: Endoscopy;  Laterality: N/A;  . FLEXIBLE SIGMOIDOSCOPY N/A 11/24/2019   Procedure: FLEXIBLE SIGMOIDOSCOPY;  Surgeon: Harvel Quale, MD;  [facility];  Service: Gastroenterology;  Laterality: N/A;  . HERNIA REPAIR    . IR FLUORO GUIDE CV LINE LEFT  04/23/2019  . IR FLUORO GUIDE CV LINE RIGHT  05/24/2019  . IR US GUIDE VASC ACCESS LEFT  04/23/2019  . MAZE N/A 03/16/2019   Procedure: MAZE;  Surgeon: Ivin Poot, MD;  Location: [place];  Service: Open Heart Surgery;  Laterality: N/A;  . PLACEMENT OF IMPELLA LEFT VENTRICULAR ASSIST DEVICE Right 03/27/2019   Procedure: Placement Of Impella Left Ventricular Assist Device using ABIOMED Impella 5.5 with SmartAssist Device.;  Surgeon: Wonda Olds, MD;  [facility];  Service: Thoracic;  Laterality: Right;  . POLYPECTOMY  11/24/2019   Procedure: POLYPECTOMY;  Surgeon: Harvel Quale, MD;  [facility];  Service: Gastroenterology;;  sigmoid  . PROSTATE SURGERY    . REMOVAL OF IMPELLA LEFT VENTRICULAR ASSIST DEVICE N/A 04/10/2019   Procedure: REMOVAL OF IMPELLA LEFT VENTRICULAR ASSIST DEVICE WITH INSERTION OF RIGHT FEMORAL ARTERIAL LINE;  Surgeon: Ivin Poot, MD;  Location: [place];  Service: Open Heart Surgery;  Laterality: N/A;  . REMOVAL OF PLEURAL DRAINAGE CATHETER Left 07/02/2019   Procedure: REMOVAL OF PLEURAL DRAINAGE CATHETER;  Surgeon: Ivin Poot, MD;  Location: [employer];  Service: Thoracic;  Laterality: Left;  . RIGHT/LEFT HEART CATH AND CORONARY ANGIOGRAPHY N/A 03/15/2019   Procedure: RIGHT/LEFT HEART CATH AND CORONARY ANGIOGRAPHY;  Surgeon: Burnell Blanks, MD;  Location: [place] [facility];  Service: Cardiovascular;  Laterality: N/A;  . TEE WITHOUT CARDIOVERSION N/A 03/16/2019   Procedure: TRANSESOPHAGEAL ECHOCARDIOGRAM (TEE);  Surgeon: Prescott Gum, Collier Salina, MD;  Location: [place];  Service: Open Heart Surgery;  Laterality: N/A;  . TRACHEOSTOMY TUBE PLACEMENT N/A 03/27/2019    Procedure: TRACHEOSTOMY placed using [place] 8DCT Cuffed.;  Surgeon: Wonda Olds, MD;  [facility];  Service: Thoracic;  Laterality: N/A;  . TRANSURETHRAL RESECTION OF BLADDER NECK N/A 05/13/2019   Procedure: CYSTOSCOPY CLOT EVACUATION FULGRATION CYSTOGRAM AND INSTILLATION OF FORMALIN;  Surgeon: Lucas Mallow, MD;  Location: [place];  Service: Urology;  Laterality: N/A;   Family History:   Family History  Problem Relation Age of Onset  . CAD Mother 22  . CAD Father 59  . CAD Sister   . CAD Brother    Social History:  reports that he quit smoking about 29 years ago. His smoking use included cigarettes. He has a 12.50 pack-year smoking history. He has never used smokeless tobacco. He reports that he does not drink alcohol and does not use drugs. Allergies  Allergen Reactions  . Atorvastatin Anaphylaxis and Rash    Leg pain  . Adhesive [Tape]     Tears skin  . Amiodarone     Rash (see 05/2019 hospital notes), but was on other medications at the time so unclear which agent   .  Reglan [Metoclopramide]     Rash (see 05/2019 hospital notes), but was on other medications at the time so unclear which agent  . Heparin Rash    Thrombocytopenia/ HIT   Prior to Admission medications   Medication Sig Start Date End Date Taking? Authorizing Provider  acetaminophen (TYLENOL) 500 MG tablet Take 500 mg by mouth every 6 (six) hours as needed for moderate pain.   Yes [provider]  aspirin 81 MG chewable tablet Chew 81 mg by mouth in the morning.  05/21/19  Yes [provider]  b complex-vitamin c-folic acid (NEPHRO-VITE) 0.8 MG TABS tablet Take 1 tablet by mouth daily.   Yes [provider]  Calcium Carbonate-Vitamin D 600-400 MG-UNIT tablet Take 1 tablet by mouth daily. 10/08/19 12/07/19 Yes [provider]  carboxymethylcellulose (REFRESH PLUS) 0.5 % SOLN Place 1 drop into both eyes 4 (four) times daily. 10/08/19  Yes [provider]  docusate  sodium (COLACE) 100 MG capsule Take 1 capsule (100 mg total) by mouth 2 (two) times daily. 10/17/19  Yes Gherghe, Vella Redhead, MD  Ensure Plus (ENSURE PLUS) LIQD Take 237 mLs by mouth 2 (two) times daily between meals.   Yes [provider]  famotidine (PEPCID) 20 MG tablet Take 20 mg by mouth daily.    Yes [provider]  midodrine (PROAMATINE) 10 MG tablet Take 10 mg by mouth 2 (two) times daily.  10/08/19 10/07/20 Yes [provider]  Nutritional Supplements (FEEDING SUPPLEMENT, NEPRO CARB STEADY,) LIQD Take 237 mLs by mouth at bedtime.   Yes [provider]  nystatin ointment (MYCOSTATIN) Apply 1 application topically daily as needed for itching. Apply to Penis as needed if itching occurs. 06/22/19  Yes [provider]  oxyCODONE-acetaminophen (PERCOCET/ROXICET) 5-325 MG tablet Take 1 tablet by mouth every 4 (four) hours as needed for severe pain.   Yes [provider]  polyethylene glycol (MIRALAX / GLYCOLAX) 17 g packet Take 17 g by mouth 2 (two) times daily. 11/25/19  Yes Tat, Shanon Brow, MD  predniSONE (DELTASONE) 10 MG tablet Take 40 mg by mouth daily.  02/07/2020  Yes [provider]  predniSONE (DELTASONE) 10 MG tablet Take 50 mg by mouth daily with breakfast. 12/26/19 01/31/2020 Yes [provider]  predniSONE (DELTASONE) 10 MG tablet Take 60 mg by mouth daily with breakfast. 11/26/19 12/26/19 Yes [provider]  senna (SENOKOT) 8.6 MG tablet Take 1 tablet by mouth as needed for constipation.  10/08/19 10/07/20 Yes [provider]  sulfamethoxazole-trimethoprim (BACTRIM) 400-80 MG tablet Take 1 tablet by mouth 3 (three) times a week. Monday, Wednesday, Friday 10/08/19 12/08/19 Yes [provider]  triamcinolone cream (KENALOG) 0.1 % Apply 1 application topically 2 (two) times daily. Apply to skin that is intact. DO NOT apply to "burn wounds". 10/08/19 10/07/20 Yes [provider]  iron sucrose (VENOFER)  20 MG/ML injection Iron Sucrose (Venofer) 11/17/19 11/15/20  [provider]  midodrine (PROAMATINE) 5 MG tablet Take Every Tuesday, Thursday, Saturday Patient taking differently: Take Every Monday, Wednesday, Friday 11/07/19   Ahmed Prima, Fransisco Hertz, PA-C   Current Facility-Administered Medications  Medication Dose Route Frequency Provider Last Rate Last Admin  . aspirin chewable tablet 81 mg  81 mg Oral q AM Johnson, Clanford L, MD      . calcium-vitamin D (OSCAL WITH D) 500-200 MG-UNIT per tablet 1 tablet  1 tablet Oral Daily Johnson, Clanford L, MD      . Derrill Memo ON 12/04/2019] ceFEPIme (MAXIPIME)  2 g in sodium chloride 0.9 % 100 mL IVPB  2 g Intravenous Q T,Th,Sa-HD Johnson, Clanford L, MD      . docusate sodium (COLACE) capsule 100 mg  100 mg Oral BID Johnson, Clanford L, MD   100 mg at 12/02/19 2346  . [START ON 12/04/2019] famotidine (PEPCID) tablet 20 mg  20 mg Oral Q T,Th,Sa-HD Johnson, Clanford L, MD      . feeding supplement (ENSURE ENLIVE / ENSURE PLUS) liquid 237 mL  237 mL Oral BID BM Johnson, Clanford L, MD      . feeding supplement (NEPRO CARB STEADY) liquid 237 mL  237 mL Oral QHS Johnson, Clanford L, MD      . midodrine (PROAMATINE) tablet 10 mg  10 mg Oral BID Johnson, Clanford L, MD   10 mg at 12/02/19 2345  . midodrine (PROAMATINE) tablet 5 mg  5 mg Oral Q M,W,F-HD Johnson, Clanford L, MD      . multivitamin (RENA-VIT) tablet 1 tablet  1 tablet Oral QHS Johnson, Clanford L, MD   1 tablet at 12/02/19 2345  . oxyCODONE-acetaminophen (PERCOCET/ROXICET) 5-325 MG per tablet 1 tablet  1 tablet Oral Q4H PRN Johnson, Clanford L, MD      . polyethylene glycol (MIRALAX / GLYCOLAX) packet 17 g  17 g Oral BID Johnson, Clanford L, MD      . polyvinyl alcohol (LIQUIFILM TEARS) 1.4 % ophthalmic solution 1 drop  1 drop Both Eyes QID Johnson, Clanford L, MD   1 drop at 12/03/19 0017  . predniSONE (DELTASONE) tablet 60 mg  60 mg Oral Q breakfast Johnson, Clanford L, MD      . senna  (SENOKOT) tablet 8.6 mg  1 tablet Oral PRN Johnson, Clanford L, MD      . sulfamethoxazole-trimethoprim (BACTRIM DS) 800-160 MG per tablet 0.5 tablet  0.5 tablet Oral Q M,W,F Johnson, Clanford L, MD      . triamcinolone cream (KENALOG) 0.1 % 1 application  1 application Topical BID Johnson, Clanford L, MD      . Derrill Memo ON 12/04/2019] vancomycin (VANCOREADY) IVPB 750 mg/150 mL  750 mg Intravenous Q T,Th,Sa-HD Murlean Iba, MD       Labs: Basic Metabolic Panel: Recent Labs  Lab 12/02/19 1133 12/03/19 0637  NA 137 136  K 3.1* 3.8  CL 95* 96*  CO2 29 28  GLUCOSE 86 77  BUN 22 37*  CREATININE 2.65* 3.37*  CALCIUM 7.8* 7.6*  PHOS  --  4.6   Liver Function Tests: Recent Labs  Lab 12/02/19 1133 12/03/19 0637  AST 17  --   ALT 14  --   ALKPHOS 64  --   BILITOT 0.7  --   PROT 5.3*  --   ALBUMIN 2.5* 2.1*   No results for input(s): LIPASE, AMYLASE in the last 168 hours. No results for input(s): AMMONIA in the last 168 hours. CBC: Recent Labs  Lab 12/02/19 1133 12/03/19 0637  WBC 8.9 9.3  NEUTROABS 7.9* 7.5  HGB 10.2* 8.8*  HCT 34.0* 29.5*  MCV 103.7* 104.2*  PLT 156 147*   Cardiac Enzymes: No results for input(s): CKTOTAL, CKMB, CKMBINDEX, TROPONINI in the last 168 hours. CBG: No results for input(s): GLUCAP in the last 168 hours. Iron Studies:  Recent Labs    12/02/19 1133  FERRITIN 1,339*   Studies/Results: DG Chest Port 1 View  Result Date: 12/02/2019 CLINICAL DATA:  Fever, cough. EXAM: PORTABLE CHEST 1 VIEW COMPARISON:  11/19/2019 chest  radiograph and prior. FINDINGS: Tunneled left IJ CVC tip overlies the right atrium. Patchy bibasilar opacities. No pneumothorax. Trace left pleural effusion, unchanged. Postsurgical appearance of the cardiomediastinal silhouette. Left lower chest pacing pads. No acute osseous abnormality. IMPRESSION: Patchy bibasilar opacities, atelectasis versus infection. Trace left pleural effusion, unchanged. Electronically Signed   By:  Primitivo Gauze M.D.   On: 12/02/2019 12:02    ROS: Pertinent items are noted in HPI. Physical Exam: Vitals:   12/02/19 1830 12/02/19 2100 12/02/19 2342 12/03/19 0347  BP: (!) 115/50 (!) 98/47 (!) 110/52 133/60  Pulse:  85 92 85  Resp: (!) 29 19 18 18   Temp:  98.5 F (36.9 C) 97.9 F (36.6 C) 97.9 F (36.6 C)  TempSrc:  Oral Oral Oral  SpO2: 100% 99% 100% 100%  Weight:   62.6 kg   Height:   5\' 8"  (1.727 m)       Weight change:   Intake/Output Summary (Last 24 hours) at 12/03/2019 0847 Last data filed at 12/03/2019 0011 Gross per 24 hour  Intake 770 ml  Output --  Net 770 ml   BP 133/60 (BP Location: Right Arm)   Pulse 85   Temp 97.9 F (36.6 C) (Oral)   Resp 18   Ht 5\' 8"  (1.727 m)   Wt 62.6 kg   SpO2 100%   BMI 20.98 kg/m  General appearance: fatigued and no distress Head: Normocephalic, without obvious abnormality, atraumatic Resp: clear to auscultation bilaterally Cardio: regular rate and rhythm and no rub GI: soft, non-tender; bowel sounds normal; no masses,  no organomegaly Extremities: edema trace presacral  Dialysis Access: LIJ TDC, RUE AVF +T/B  [facility], 4 hr, 180, 400/800, EDW 62.5kg, 2K/2.25Ca; CVC 9/30/ Phos 3.3, Ca 8.2, PTH 173 10/7 last post wt 61.9kg, Hb 8, mircera 225 q2wks  Assessment/Plan: 1.  HCAP- negative covid-19, negative influenza.  On azithromycin and maxipime per primary 2.  ESRD -   Plan for HD tomorrow to keep on outpatient schedule. 3.  Hypertension/volume  - low bp.  Continue with chronic midodrine. 4.  Anemia  - h/o GI Bleed.  Will cont with ESA and transfuse as needed 5.  Metabolic bone disease -   Stable, cont with outpatient meds 6.  Nutrition -  Renal diet 7. Vascular access- please avoid IV's in right arm due to RUE AVF. 8. H/o GIB- no heparin with HD. 9. SVT with RVR and hypotension- s/p synchronized cardioversion.  Cardiology to see today. 10. 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 11. Deconditioning- cont  with PT/OT and return to SNF when stable. 12. Chronic hypotension- on midodrine. 32. H/o prostate cancer s/p seed implants 14. Disposition- to return to SNF when stable.   Donetta Potts, MD [place] Pager [phone] 12/03/2019, 8:47 AM

## 2019-12-03 NOTE — Plan of Care (Signed)
  Problem: Education: Goal: Knowledge of General Education information will improve Description Including pain rating scale, medication(s)/side effects and non-pharmacologic comfort measures Outcome: Progressing   Problem: Health Behavior/Discharge Planning: Goal: Ability to manage health-related needs will improve Outcome: Progressing   

## 2019-12-03 NOTE — Progress Notes (Signed)
CRITICAL VALUE ALERT  Critical Value: (+) blood culture - Staph Epi - mec A/C  Date & Time Notied:  12/03/2019 @ 1740  Provider Notified: MD Wynetta Emery  Orders Received/Actions taken: none at this time

## 2019-12-03 NOTE — Progress Notes (Signed)
Called to room by patient at 1750, states got strangled on his milk and felt like he had a piece of chicken stuck in his airway, but that resolved and pt without any further difficulty. Breath sounds diminished but CTA, voice clear, able to swallow water without diff. SaO2 99% on 2 lpm [place]. At 1820, heard pt coughing and hacking in hallway. In to room to find pt coughing, cough congested, spitting up thick clear phlegm. Pt states this usually happens after he eats but then he spits it out and it's over. Again, lungs clear and diminished. Congested cough, voice clear. SaO2 99 - 100% on 2 lpm . Pt says, "I just can't get this phlegm up, it's too thick." Called RT to come and evaluate pt as well. MD Wynetta Emery notified of earlier strangling episode and current congested cough.

## 2019-12-03 NOTE — Progress Notes (Signed)
12/03/2019 6:00 PM  Methicillin resistant staph epi in 2 bottles positive blood culture results.  Continue vancomyin.  Repeat blood cultures today.   Murvin Natal MD  How to contact the [facility] Attending or Consulting provider [place] or covering provider during after hours [place], for this patient?  1. Check the care team in [facility] and look for a) attending/consulting [facility] provider listed and b) the [facility] team listed 2. Log into [email or web address] and use [place]'s universal password to access. If you do not have the password, please contact the hospital operator. 3. Locate the [facility] provider you are looking for under [facility] and page to a number that you can be directly reached. 4. If you still have difficulty reaching the provider, please page the [facility] (Director on Call) for the Hospitalists listed on amion for assistance.

## 2019-12-03 NOTE — Progress Notes (Signed)
PROGRESS NOTE   Jesse Murphy  WNU:[id number] DOB: 06-06-1928 DOA: 12/02/2019 PCP: Leeanne Rio, MD   Chief Complaint  Patient presents with  . Fever    Brief Admission History:  86 y.o. 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  He is now only on aspirin for anticoagulation.  He reports that he had been doing well until dialysis yesterday where he reports that after the procedure he felt that he was having fever and chills.  He says that he had chills most of the day and night and started having a nonproductive cough and malaise.  He was given Tylenol with some relief in symptoms.  He was sent to the emergency department due to persistent fever and was noted to be hypoxic on arrival and placed on supplemental oxygen.  Unfortunately during ER triage he developed sudden onset of tachycardia hypotension dizziness.  He had to undergo synchronized cardioversion for rapid SVT.  He went back into sinus rhythm after 200 J shock was administered.  Patient had a [employer] COVID-19 vaccine.  Assessment & Plan:   Principal Problem:   HCAP (healthcare-associated pneumonia) Active Problems:   PAF (paroxysmal atrial fibrillation) (HCC)   Dyspnea on exertion   GERD (gastroesophageal reflux disease)   BPH (benign prostatic hyperplasia)   Chronic anticoagulation   Type 2 diabetes mellitus without complications (HCC)   Heme positive stool   Non Hodgkin's lymphoma (HCC)   Hypertension   Atrial fibrillation with RVR (HCC)   Coronary artery disease due to lipid rich plaque   S/P CABG x 3   Pressure injury of skin    Debility   Pressure ulcer   ESRD on dialysis ([place])   Dysphagia   Supplemental oxygen dependent   Hypoalbuminemia due to protein-calorie malnutrition (HCC)   Adult failure to thrive   Anemia in chronic kidney disease   Coagulation defect, unspecified (HCC)   Hypothyroidism, unspecified   Iron deficiency anemia, unspecified   Pemphigus vulgaris   Unspecified protein-calorie malnutrition (HCC)   Systolic and diastolic CHF, chronic ([place])   Ischemic cardiomyopathy   History of Pseudomonas pneumonia ([place])   Stercoral ulcer of rectum   DNR (do not resuscitate)  1. HCAP - pt is responding well to broad spectrum antibiotics, plan to de-escalate 10/19 if blood cultures remain no growth. Continue supportive measures.   2. Rapid SVT - s/p synchronized cardioversion in ED with return to sinus rhythm.  [place] cardiology consultation.  If there is a recurrence of sVT will start amiodarone.   3. DNR present on admission.  4. Paroxysmal atrial fibrillation - has not been anticoagulated due to GI bleed and hematuria.  Had recent admission for stercoral ulcer bleed.   5. Pemphigus vulgaris - He is being slowly weaned from high dose prednisone, currently on 60 mg daily.   6. Ischemic cardiomyopathy - resumed his regular cardiac medications.  7. ESRD on hemodialysis TTS - appreciate nephrology consultation.  He is to resume maintenance HD 10/19.   8. Bilateral pressure wounds to heels - heel protection ordered.  9. CAD - s/p CABG 1/21 - continue aspirin 81 mg daily.  10. Chronic hypotension - He is on midodrine 10  mg BID with an extra 5 mg dose given the morning of hemodialysis per nephrology team.   11. History of prostate cancer - treated with seed implants.    DVT prophylaxis:  SCD Code Status: DNR  Family Communication:  Disposition: SNF   Status is: Inpatient  Remains inpatient appropriate because:Inpatient level of care appropriate due to severity of illness   Dispo:  Patient From:  [facility]  Planned Disposition: [place]  Expected discharge date: 12/06/19  Medically stable for discharge: No          Consultants:   Cardiology  Nephrology   Procedures:   Hemodialysis   Antimicrobials:  vanc 10/17>> Cefepime 10/17>>   Subjective: Pt says he is feeling better today. Cough is persistent but no chills and no fever.    Objective: Vitals:   12/02/19 2100 12/02/19 2342 12/03/19 0347 12/03/19 1000  BP: (!) 98/47 (!) 110/52 133/60 (!) 122/49  Pulse: 85 92 85 89  Resp: 19 18 18 16   Temp: 98.5 F (36.9 C) 97.9 F (36.6 C) 97.9 F (36.6 C)   TempSrc: Oral Oral Oral   SpO2: 99% 100% 100% 100%  Weight:  62.6 kg    Height:  5\' 8"  (1.727 m)      Intake/Output Summary (Last 24 hours) at 12/03/2019 1132 Last data filed at 12/03/2019 0900 Gross per 24 hour  Intake 1010 ml  Output --  Net 1010 ml   Filed Weights   12/02/19 1049 12/02/19 2342  Weight: 62 kg 62.6 kg    Examination:  General exam: chronically ill appearing male, elderly, Appears calm and comfortable  Respiratory system: rales RLL. Respiratory effort normal. Cardiovascular system: S1 & S2 heard. No JVD, murmurs, rubs, gallops or clicks. No pedal edema. Gastrointestinal system: Abdomen is nondistended, soft and nontender. No organomegaly or masses felt. Normal bowel sounds heard. Central nervous system: Alert and oriented. No focal neurological deficits. Extremities: Symmetric 5 x 5 power. Skin: minor bilateral heel pressure injury stage 1, No rashes, lesions or ulcers Psychiatry: Judgement and insight appear normal. Mood & affect appropriate.   Data Reviewed: I have personally reviewed following labs and imaging studies  CBC: Recent Labs  Lab 12/02/19 1133 12/03/19 0637  WBC 8.9 9.3  NEUTROABS 7.9* 7.5  HGB 10.2* 8.8*  HCT 34.0* 29.5*  MCV 103.7* 104.2*  PLT 156 147*    Basic Metabolic Panel: Recent Labs  Lab 12/02/19 1133  12/03/19 0637  NA 137 136  K 3.1* 3.8  CL 95* 96*  CO2 29 28  GLUCOSE 86 77  BUN 22 37*  CREATININE 2.65* 3.37*  CALCIUM 7.8* 7.6*  MG  --  1.9  PHOS  --  4.6    GFR: Estimated Creatinine Clearance: 13.4 mL/min (A) (by C-G formula based on SCr of 3.37 mg/dL (H)).  Liver Function Tests: Recent Labs  Lab 12/02/19 1133 12/03/19 0637  AST 17  --   ALT 14  --   ALKPHOS 64  --   BILITOT 0.7  --   PROT 5.3*  --   ALBUMIN 2.5* 2.1*    CBG: No results for input(s): GLUCAP in the last 168 hours.  Recent Results (from the past 240 hour(s))  Respiratory Panel by RT PCR (Flu A&B, Covid) - Nasopharyngeal Swab     Status: None   Collection Time: 11/23/19  1:34 PM   Specimen: Nasopharyngeal Swab  Result Value Ref Range Status   SARS Coronavirus 2 by RT PCR  NEGATIVE NEGATIVE Final    Comment: (NOTE) SARS-CoV-2 target nucleic acids are NOT DETECTED.  The SARS-CoV-2 RNA is generally detectable in upper respiratoy specimens during the acute phase of infection. The lowest concentration of SARS-CoV-2 viral copies this assay can detect is 131 copies/mL. A negative result does not preclude SARS-Cov-2 infection and should not be used as the sole basis for treatment or other patient management decisions. A negative result may occur with  improper specimen collection/handling, submission of specimen other than nasopharyngeal swab, presence of viral mutation(s) within the areas targeted by this assay, and inadequate number of viral copies (<131 copies/mL). A negative result must be combined with clinical observations, patient history, and epidemiological information. The expected result is Negative.  Fact Sheet for Patients:  PinkCheek.be  Fact Sheet for Healthcare Providers:  GravelBags.it  This test is no t yet approved or cleared by the [place] FDA and  has been authorized for detection and/or diagnosis of SARS-CoV-2  by FDA under an Emergency Use Authorization (EUA). This EUA will remain  in effect (meaning this test can be used) for the duration of the COVID-19 declaration under Section 564(b)(1) of the Act, 21 U.S.C. section 360bbb-3(b)(1), unless the authorization is terminated or revoked sooner.     Influenza A by PCR NEGATIVE NEGATIVE Final   Influenza B by PCR NEGATIVE NEGATIVE Final    Comment: (NOTE) The Xpert Xpress SARS-CoV-2/FLU/RSV assay is intended as an aid in  the diagnosis of influenza from Nasopharyngeal swab specimens and  should not be used as a sole basis for treatment. Nasal washings and  aspirates are unacceptable for Xpert Xpress SARS-CoV-2/FLU/RSV  testing.  Fact Sheet for Patients: PinkCheek.be  Fact Sheet for Healthcare Providers: GravelBags.it  This test is not yet approved or cleared by the [place] FDA and  has been authorized for detection and/or diagnosis of SARS-CoV-2 by  FDA under an Emergency Use Authorization (EUA). This EUA will remain  in effect (meaning this test can be used) for the duration of the  Covid-19 declaration under Section 564(b)(1) of the Act, 21  U.S.C. section 360bbb-3(b)(1), unless the authorization is  terminated or revoked. Performed at [facility], [address], [place], [place] [address]   Culture, Urine     Status: Abnormal   Collection Time: 11/25/19 10:12 AM   Specimen: Urine, Clean Catch  Result Value Ref Range Status   Specimen Description   Final    URINE, CLEAN CATCH Performed at [facility], [address] [place], [place] [address]    Special Requests   Final    NONE Performed at [facility], [address] [place], [place] [address]    Culture (A)  Final    >=100,000 COLONIES/mL ENTEROCOCCUS FAECIUM WITHIN MIXED ORGANISMS Performed at [facility], [place] [address] [place],  [address]    Report Status 11/30/2019 FINAL  Final   Organism  ID, Bacteria ENTEROCOCCUS FAECIUM (A)  Final      Susceptibility   Enterococcus faecium - MIC*    AMPICILLIN >=32 RESISTANT Resistant     NITROFURANTOIN 32 SENSITIVE Sensitive     VANCOMYCIN <=0.5 SENSITIVE Sensitive     * >=100,000 COLONIES/mL ENTEROCOCCUS FAECIUM  Respiratory Panel by RT PCR (Flu A&B, Covid) - Nasopharyngeal Swab     Status: None   Collection Time: 12/02/19 11:18 AM   Specimen: Nasopharyngeal Swab  Result Value Ref Range Status   SARS Coronavirus 2 by RT PCR NEGATIVE NEGATIVE Final  Comment: (NOTE) SARS-CoV-2 target nucleic acids are NOT DETECTED.  The SARS-CoV-2 RNA is generally detectable in upper respiratoy specimens during the acute phase of infection. The lowest concentration of SARS-CoV-2 viral copies this assay can detect is 131 copies/mL. A negative result does not preclude SARS-Cov-2 infection and should not be used as the sole basis for treatment or other patient management decisions. A negative result may occur with  improper specimen collection/handling, submission of specimen other than nasopharyngeal swab, presence of viral mutation(s) within the areas targeted by this assay, and inadequate number of viral copies (<131 copies/mL). A negative result must be combined with clinical observations, patient history, and epidemiological information. The expected result is Negative.  Fact Sheet for Patients:  PinkCheek.be  Fact Sheet for Healthcare Providers:  GravelBags.it  This test is no t yet approved or cleared by the [place] FDA and  has been authorized for detection and/or diagnosis of SARS-CoV-2 by FDA under an Emergency Use Authorization (EUA). This EUA will remain  in effect (meaning this test can be used) for the duration of the COVID-19 declaration under Section 564(b)(1) of the Act, 21 U.S.C. section 360bbb-3(b)(1), unless the authorization is terminated or revoked sooner.      Influenza A by PCR NEGATIVE NEGATIVE Final   Influenza B by PCR NEGATIVE NEGATIVE Final    Comment: (NOTE) The Xpert Xpress SARS-CoV-2/FLU/RSV assay is intended as an aid in  the diagnosis of influenza from Nasopharyngeal swab specimens and  should not be used as a sole basis for treatment. Nasal washings and  aspirates are unacceptable for Xpert Xpress SARS-CoV-2/FLU/RSV  testing.  Fact Sheet for Patients: PinkCheek.be  Fact Sheet for Healthcare Providers: GravelBags.it  This test is not yet approved or cleared by the [place] FDA and  has been authorized for detection and/or diagnosis of SARS-CoV-2 by  FDA under an Emergency Use Authorization (EUA). This EUA will remain  in effect (meaning this test can be used) for the duration of the  Covid-19 declaration under Section 564(b)(1) of the Act, 21  U.S.C. section 360bbb-3(b)(1), unless the authorization is  terminated or revoked. Performed at [facility], [address], [place], [place] [address]   Blood Culture (routine x 2)     Status: None (Preliminary result)   Collection Time: 12/02/19 11:47 AM   Specimen: Left Antecubital; Blood  Result Value Ref Range Status   Specimen Description   Final    LEFT ANTECUBITAL BOTTLES DRAWN AEROBIC AND ANAEROBIC   Special Requests Blood Culture adequate volume  Final   Culture   Final    NO GROWTH < 24 HOURS Performed at [facility], [address] [place], [place] [address]    Report Status PENDING  Incomplete  Blood Culture (routine x 2)     Status: None (Preliminary result)   Collection Time: 12/02/19 11:48 AM   Specimen: BLOOD LEFT HAND  Result Value Ref Range Status   Specimen Description BLOOD LEFT HAND AEROBIC BOTTLE ONLY  Final   Special Requests   Final    Blood Culture results may not be optimal due to an inadequate volume of blood received in culture bottles   Culture   Final    NO GROWTH < 24  HOURS Performed at [facility], [address] [place], [place] [address]    Report Status PENDING  Incomplete     Radiology Studies: DG Chest Port 1 View  Result Date: 12/02/2019 CLINICAL DATA:  Fever, cough. EXAM: PORTABLE CHEST 1  VIEW COMPARISON:  11/19/2019 chest radiograph and prior. FINDINGS: Tunneled left IJ CVC tip overlies the right atrium. Patchy bibasilar opacities. No pneumothorax. Trace left pleural effusion, unchanged. Postsurgical appearance of the cardiomediastinal silhouette. Left lower chest pacing pads. No acute osseous abnormality. IMPRESSION: Patchy bibasilar opacities, atelectasis versus infection. Trace left pleural effusion, unchanged. Electronically Signed   By: Primitivo Gauze M.D.   On: 12/02/2019 12:02   Scheduled Meds: . aspirin  81 mg Oral q AM  . calcium-vitamin D  1 tablet Oral Daily  . Chlorhexidine Gluconate Cloth  6 each Topical Q0600  . docusate sodium  100 mg Oral BID  . [START ON 12/04/2019] famotidine  20 mg Oral Q T,Th,Sa-HD  . feeding supplement  237 mL Oral BID BM  . feeding supplement (NEPRO CARB STEADY)  237 mL Oral QHS  . midodrine  10 mg Oral BID  . [START ON 12/04/2019] midodrine  10 mg Oral Q T,Th,Sa-HD  . multivitamin  1 tablet Oral QHS  . polyethylene glycol  17 g Oral BID  . polyvinyl alcohol  1 drop Both Eyes QID  . predniSONE  60 mg Oral Q breakfast  . sulfamethoxazole-trimethoprim  0.5 tablet Oral Q M,W,F  . triamcinolone cream  1 application Topical BID   Continuous Infusions: . [START ON 12/04/2019] ceFEPime (MAXIPIME) IV    . [START ON 12/04/2019] vancomycin       LOS: 1 day   Time spent: 30 mins    Xavyer Steenson Wynetta Emery, MD How to contact the [facility] Attending or Consulting provider [place] or covering provider during after hours [place], for this patient?  1. Check the care team in [facility] and look for a) attending/consulting [facility] provider listed and b) the [facility] team listed 2. Log into [email or web address] and use [place]'s  universal password to access. If you do not have the password, please contact the hospital operator. 3. Locate the [facility] provider you are looking for under [facility] and page to a number that you can be directly reached. 4. If you still have difficulty reaching the provider, please page the [facility] (Director on Call) for the Hospitalists listed on amion for assistance.  12/03/2019, 11:32 AM

## 2019-12-04 DIAGNOSIS — I4891 Unspecified atrial fibrillation: Secondary | ICD-10-CM | POA: Diagnosis not present

## 2019-12-04 DIAGNOSIS — R627 Adult failure to thrive: Secondary | ICD-10-CM | POA: Diagnosis not present

## 2019-12-04 DIAGNOSIS — C8590 Non-Hodgkin lymphoma, unspecified, unspecified site: Secondary | ICD-10-CM

## 2019-12-04 DIAGNOSIS — I255 Ischemic cardiomyopathy: Secondary | ICD-10-CM

## 2019-12-04 DIAGNOSIS — J189 Pneumonia, unspecified organism: Secondary | ICD-10-CM | POA: Diagnosis not present

## 2019-12-04 DIAGNOSIS — N400 Enlarged prostate without lower urinary tract symptoms: Secondary | ICD-10-CM | POA: Diagnosis not present

## 2019-12-04 DIAGNOSIS — R1310 Dysphagia, unspecified: Secondary | ICD-10-CM

## 2019-12-04 LAB — CBC WITH DIFFERENTIAL/PLATELET
Abs Immature Granulocytes: 0.06 10*3/uL (ref 0.00–0.07)
Basophils Absolute: 0 10*3/uL (ref 0.0–0.1)
Basophils Relative: 0 %
Eosinophils Absolute: 0 10*3/uL (ref 0.0–0.5)
Eosinophils Relative: 0 %
HCT: 28.6 % — ABNORMAL LOW (ref 39.0–52.0)
Hemoglobin: 8.3 g/dL — ABNORMAL LOW (ref 13.0–17.0)
Immature Granulocytes: 1 %
Lymphocytes Relative: 7 %
Lymphs Abs: 0.5 10*3/uL — ABNORMAL LOW (ref 0.7–4.0)
MCH: 30 pg (ref 26.0–34.0)
MCHC: 29 g/dL — ABNORMAL LOW (ref 30.0–36.0)
MCV: 103.2 fL — ABNORMAL HIGH (ref 80.0–100.0)
Monocytes Absolute: 0.5 10*3/uL (ref 0.1–1.0)
Monocytes Relative: 8 %
Neutro Abs: 5.3 10*3/uL (ref 1.7–7.7)
Neutrophils Relative %: 84 %
Platelets: 154 10*3/uL (ref 150–400)
RBC: 2.77 MIL/uL — ABNORMAL LOW (ref 4.22–5.81)
RDW: 18.3 % — ABNORMAL HIGH (ref 11.5–15.5)
WBC: 6.3 10*3/uL (ref 4.0–10.5)
nRBC: 0 % (ref 0.0–0.2)

## 2019-12-04 LAB — RENAL FUNCTION PANEL
Albumin: 2.1 g/dL — ABNORMAL LOW (ref 3.5–5.0)
Anion gap: 14 (ref 5–15)
BUN: 59 mg/dL — ABNORMAL HIGH (ref 8–23)
CO2: 27 mmol/L (ref 22–32)
Calcium: 8 mg/dL — ABNORMAL LOW (ref 8.9–10.3)
Chloride: 96 mmol/L — ABNORMAL LOW (ref 98–111)
Creatinine, Ser: 4.47 mg/dL — ABNORMAL HIGH (ref 0.61–1.24)
GFR, Estimated: 11 mL/min — ABNORMAL LOW (ref >60)
Glucose, Bld: 114 mg/dL — ABNORMAL HIGH (ref 70–99)
Phosphorus: 4.8 mg/dL — ABNORMAL HIGH (ref 2.5–4.6)
Potassium: 5.2 mmol/L — ABNORMAL HIGH (ref 3.5–5.1)
Sodium: 137 mmol/L (ref 135–145)

## 2019-12-04 LAB — MAGNESIUM: Magnesium: 2 mg/dL (ref 1.7–2.4)

## 2019-12-04 MED ORDER — DOXYCYCLINE HYCLATE 100 MG PO CAPS: 100.0000 mg | ORAL_CAPSULE | Freq: Two times a day (BID) | ORAL | 0 refills | Status: AC

## 2019-12-04 MED ORDER — ANTICOAGULANT SODIUM CITRATE 4% (200MG/5ML) IV SOLN
5.0000 mL | Freq: Once | Status: AC
  Administered 2019-12-04: 5 mL
  Filled 2019-12-04: qty 5

## 2019-12-04 MED ORDER — HEPARIN SODIUM (PORCINE) 1000 UNIT/ML IJ SOLN: 4200.0000 [IU] | Freq: Once | INTRAMUSCULAR | Status: DC

## 2019-12-04 MED ORDER — ALBUMIN HUMAN 25 % IV SOLN
25.0000 g | Freq: Once | INTRAVENOUS | Status: AC
  Administered 2019-12-04: 25 g via INTRAVENOUS
  Filled 2019-12-04: qty 100

## 2019-12-04 NOTE — Progress Notes (Signed)
Telemetry reviewed, no recurrence of SVT. Keep K at 4 and Mg at 2. If significant recurrence would start amiodarone. Hopefully as systemic illness improves and electrolytes maintained will prevent recurrence. We will sign off inpatient care, please call us back if recurrent issues with SVT.    Carlyle Dolly MD

## 2019-12-04 NOTE — Discharge Summary (Addendum)
Physician Discharge Summary  JAKYLAN RON FIE:[id number] DOB: [birth year] DOA: 12/02/2019  PCP: Leeanne Rio, MD  Admit date: 12/02/2019 Discharge date: 12/04/2019  Admitted From:  [facility]  Disposition:  [facility]   Recommendations for Outpatient Follow-up:  1. Resume regular hemodialysis schedule   Discharge Condition: STABLE   CODE STATUS: DNR   DIET: resume prior diet chin tuck with thin liquids  Brief Hospitalization Summary: Please see all hospital notes, images, labs for full details of the hospitalization. ADMISSION HPI: Jesse Murphy is a 86 y.o. 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  He is now only on aspirin for anticoagulation.  He reports that he had been doing well until dialysis yesterday where he reports that after the procedure he felt that he was having fever and chills.  He says that he had chills most of the day and night and started having a nonproductive cough and malaise.  He was given Tylenol with some relief in symptoms.  He was sent to the emergency department due to persistent fever and was noted to be hypoxic on arrival and placed on supplemental oxygen.  Unfortunately during ER triage he developed sudden onset of tachycardia hypotension dizziness.  He had to undergo synchronized cardioversion for rapid SVT.  He went back into sinus rhythm after 200 J shock was administered.  Patient had a [employer] COVID-19 vaccine.   ED Course: 99.4, pulse 107, BP 114/63, pulse ox 99% on 2 L nasal cannula.  CRP 9.8.  WBC 8.9, hemoglobin 10.2, platelet count 156.  Sodium 137, potassium  3.1, creatinine 2.65, total protein 5.3, BNP 1736.  High-sensitivity troponin 47.  Lactic acid 1.3.  Chest x-ray with patchy bibasilar opacities.  SARS 2 coronavirus test and influenza a and B testing negative.  D-dimer 6.69.  Fibrinogen 467.  Blood cultures x2 obtained.  Patient was started on broad-spectrum antibiotic for presumed healthcare associated pneumonia.  Admission was requested for further management.  Cardiologist Dr. Tamala Julian was consulted who recommended consideration of amiodarone infusion if rapid heart rate returns.  Hospital Course  1. HCAP - pt is responding well to broad spectrum antibiotics, plan to de-escalate 10/19 to doxycycline. Continue supportive measures.   2. Rapid SVT - s/p synchronized cardioversion in ED with return to sinus rhythm.  [place] cardiology consultation.  If there is a recurrence of sVT would start amiodarone but he has not had any recurrence and has been stable.   3. DNR present on admission.  4. Paroxysmal atrial fibrillation - has not been anticoagulated due to GI bleed and hematuria.  Had recent admission for stercoral ulcer bleed.   5. Pemphigus vulgaris - He is being slowly weaned from high dose prednisone, currently on 60 mg daily.   6. Ischemic cardiomyopathy - resumed his regular cardiac medications.  7. ESRD on hemodialysis TTS - appreciate nephrology consultation.  He is to resume maintenance HD 10/19. Per nephrology he can discharge after hemodialysis today.  8. Positive blood culture - 1/2 positive for staph epid - this is contaminant.  Repeated cultures have been no growth to date.    9. Stage 1 Bilateral pressure wounds to heels - heel protection ordered.  10. CAD - s/p CABG 1/21 - continue aspirin 81 mg daily.  11. Chronic hypotension - He is on midodrine 10 mg BID with an extra 5 mg dose given the morning of hemodialysis per nephrology team.   12. History of prostate cancer - treated with seed implants.     DVT prophylaxis:  SCD Code  Status: DNR  Family Communication: I called and spoke with daughter telephone 10/19 Disposition: SNF    Discharge Diagnoses:  Principal Problem:   HCAP (healthcare-associated pneumonia) Active Problems:   PAF (paroxysmal atrial fibrillation) (HCC)   Dyspnea on exertion   GERD (gastroesophageal reflux disease)   BPH (benign prostatic hyperplasia)   Chronic anticoagulation   Type 2 diabetes mellitus without complications (HCC)   Heme positive stool   Non Hodgkin's lymphoma ([place])   Hypertension   Atrial fibrillation with RVR (HCC)   Coronary artery disease due to lipid rich plaque   S/P CABG x 3   Pressure injury of skin   Debility   Pressure ulcer   ESRD on dialysis ([place])   Dysphagia   Supplemental oxygen dependent   Hypoalbuminemia due to protein-calorie malnutrition (HCC)   Adult failure to thrive   Anemia in chronic kidney disease   Coagulation defect, unspecified (HCC)   Hypothyroidism, unspecified   Iron deficiency anemia, unspecified   Pemphigus vulgaris   Unspecified protein-calorie malnutrition (HCC)   Systolic and diastolic CHF, chronic ([place])   Ischemic cardiomyopathy   History of Pseudomonas pneumonia ([place])   Stercoral ulcer of rectum   DNR (do not resuscitate)   Discharge Instructions:   Allergies as of 12/04/2019       Reactions   Atorvastatin Anaphylaxis, Rash   Leg pain   Adhesive [tape]    Tears skin   Amiodarone    Rash (see 05/2019 hospital notes), but was on other medications at the time so unclear which agent   Reglan [metoclopramide]    Rash (see 05/2019 hospital notes), but was on other medications at the time so unclear which agent   Heparin Rash   Thrombocytopenia/ HIT        Medication List     TAKE these medications    acetaminophen 500 MG tablet Commonly known as: TYLENOL Take 500 mg by mouth every 6 (six) hours as needed for moderate pain.   aspirin 81 MG chewable tablet Chew 81 mg by mouth in the morning.   b  complex-vitamin c-folic acid 0.8 MG Tabs tablet Take 1 tablet by mouth daily.   Calcium Carbonate-Vitamin D 600-400 MG-UNIT tablet Take 1 tablet by mouth daily.   carboxymethylcellulose 0.5 % Soln Commonly known as: REFRESH PLUS Place 1 drop into both eyes 4 (four) times daily.   docusate sodium 100 MG capsule Commonly known as: COLACE Take 1 capsule (100 mg total) by mouth 2 (two) times daily.   doxycycline 100 MG capsule Commonly known as: VIBRAMYCIN Take 1 capsule (100 mg total) by mouth 2 (two) times daily for 3 days. Start taking on: December 05, 2019   Ensure Plus Liqd Take 237 mLs by mouth 2 (two) times daily between meals.   feeding supplement (NEPRO CARB STEADY) Liqd Take 237 mLs by mouth at bedtime.   famotidine 20 MG tablet Commonly known as: PEPCID Take 20 mg by mouth daily.   iron sucrose 20 MG/ML injection Commonly known as: VENOFER Iron Sucrose (Venofer)   midodrine 10 MG tablet Commonly known as: PROAMATINE Take 10 mg by mouth 2 (two) times  daily. What changed: Another medication with the same name was changed. Make sure you understand how and when to take each.   midodrine 5 MG tablet Commonly known as: PROAMATINE Take Every Tuesday, Thursday, Saturday What changed: additional instructions   nystatin ointment Commonly known as: MYCOSTATIN Apply 1 application topically daily as needed for itching. Apply to Penis as needed if itching occurs.   oxyCODONE-acetaminophen 5-325 MG tablet Commonly known as: PERCOCET/ROXICET Take 1 tablet by mouth every 4 (four) hours as needed for severe pain.   polyethylene glycol 17 g packet Commonly known as: MIRALAX / GLYCOLAX Take 17 g by mouth 2 (two) times daily.   predniSONE 10 MG tablet Commonly known as: DELTASONE Take 60 mg by mouth daily with breakfast.   predniSONE 10 MG tablet Commonly known as: DELTASONE Take 50 mg by mouth daily with breakfast. Start taking on: December 26, 2019   predniSONE 10  MG tablet Commonly known as: DELTASONE Take 40 mg by mouth daily. Start taking on: January 25, 2020   senna 8.6 MG tablet Commonly known as: SENOKOT Take 1 tablet by mouth as needed for constipation.   sulfamethoxazole-trimethoprim 400-80 MG tablet Commonly known as: BACTRIM Take 1 tablet by mouth 3 (three) times a week. Monday, Wednesday, Friday   triamcinolone cream 0.1 % Commonly known as: KENALOG Apply 1 application topically 2 (two) times daily. Apply to skin that is intact. DO NOT apply to "burn wounds".        Contact information for after-discharge care     [facility] .   Service: Skilled Nursing Contact information: [place] [phone]                    Allergies  Allergen Reactions  . Atorvastatin Anaphylaxis and Rash    Leg pain  . Adhesive [Tape]     Tears skin  . Amiodarone     Rash (see 05/2019 hospital notes), but was on other medications at the time so unclear which agent   . Reglan [Metoclopramide]     Rash (see 05/2019 hospital notes), but was on other medications at the time so unclear which agent  . Heparin Rash    Thrombocytopenia/ HIT   Allergies as of 12/04/2019       Reactions   Atorvastatin Anaphylaxis, Rash   Leg pain   Adhesive [tape]    Tears skin   Amiodarone    Rash (see 05/2019 hospital notes), but was on other medications at the time so unclear which agent   Reglan [metoclopramide]    Rash (see 05/2019 hospital notes), but was on other medications at the time so unclear which agent   Heparin Rash   Thrombocytopenia/ HIT        Medication List     TAKE these medications    acetaminophen 500 MG tablet Commonly known as: TYLENOL Take 500 mg by mouth every 6 (six) hours as needed for moderate pain.   aspirin 81 MG chewable tablet Chew 81 mg by mouth in the morning.   b complex-vitamin c-folic acid 0.8 MG Tabs  tablet Take 1 tablet by mouth daily.   Calcium Carbonate-Vitamin D 600-400 MG-UNIT tablet Take 1 tablet by mouth daily.   carboxymethylcellulose 0.5 % Soln Commonly known as: REFRESH PLUS Place 1 drop into both eyes 4 (four) times daily.   docusate sodium 100 MG capsule Commonly known as: COLACE Take 1  capsule (100 mg total) by mouth 2 (two) times daily.   doxycycline 100 MG capsule Commonly known as: VIBRAMYCIN Take 1 capsule (100 mg total) by mouth 2 (two) times daily for 3 days. Start taking on: December 05, 2019   Ensure Plus Liqd Take 237 mLs by mouth 2 (two) times daily between meals.   feeding supplement (NEPRO CARB STEADY) Liqd Take 237 mLs by mouth at bedtime.   famotidine 20 MG tablet Commonly known as: PEPCID Take 20 mg by mouth daily.   iron sucrose 20 MG/ML injection Commonly known as: VENOFER Iron Sucrose (Venofer)   midodrine 10 MG tablet Commonly known as: PROAMATINE Take 10 mg by mouth 2 (two) times daily. What changed: Another medication with the same name was changed. Make sure you understand how and when to take each.   midodrine 5 MG tablet Commonly known as: PROAMATINE Take Every Tuesday, Thursday, Saturday What changed: additional instructions   nystatin ointment Commonly known as: MYCOSTATIN Apply 1 application topically daily as needed for itching. Apply to Penis as needed if itching occurs.   oxyCODONE-acetaminophen 5-325 MG tablet Commonly known as: PERCOCET/ROXICET Take 1 tablet by mouth every 4 (four) hours as needed for severe pain.   polyethylene glycol 17 g packet Commonly known as: MIRALAX / GLYCOLAX Take 17 g by mouth 2 (two) times daily.   predniSONE 10 MG tablet Commonly known as: DELTASONE Take 60 mg by mouth daily with breakfast.   predniSONE 10 MG tablet Commonly known as: DELTASONE Take 50 mg by mouth daily with breakfast. Start taking on: December 26, 2019   predniSONE 10 MG tablet Commonly known as:  DELTASONE Take 40 mg by mouth daily. Start taking on: January 25, 2020   senna 8.6 MG tablet Commonly known as: SENOKOT Take 1 tablet by mouth as needed for constipation.   sulfamethoxazole-trimethoprim 400-80 MG tablet Commonly known as: BACTRIM Take 1 tablet by mouth 3 (three) times a week. Monday, Wednesday, Friday   triamcinolone cream 0.1 % Commonly known as: KENALOG Apply 1 application topically 2 (two) times daily. Apply to skin that is intact. DO NOT apply to "burn wounds".       Procedures/Studies: . DG Chest 1 View  Result Date: 11/19/2019 CLINICAL DATA:  LEFT pleural effusion post thoracentesis, coronary artery disease, end-stage renal disease EXAM: CHEST  1 VIEW COMPARISON:  11/14/2019 FINDINGS: RIGHT jugular dual-lumen central venous catheter tip projecting over superior RIGHT atrium. Normal heart size post CABG and Maze procedure. Atherosclerotic calcification aorta. Minimal residual LEFT pleural effusion and basilar atelectasis post thoracentesis. No pneumothorax. Osseous structures unremarkable. IMPRESSION: No pneumothorax following LEFT thoracentesis. Minimal residual LEFT pleural effusion and basilar atelectasis. Electronically Signed   By: Lavonia Dana M.D.   On: 11/19/2019 13:33   DG Chest 2 View  Result Date: 11/14/2019 CLINICAL DATA:  Shortness of breath. EXAM: CHEST - 2 VIEW COMPARISON:  October 13, 2019. FINDINGS: The heart size and mediastinal contours are within normal limits. No pneumothorax is noted. Left internal jugular dialysis catheter is noted. Status post coronary bypass graft. Mild left pleural effusion is noted with associated left basilar atelectasis or infiltrate. Minimal right pleural effusion is noted. The visualized skeletal structures are unremarkable. IMPRESSION: Mild left pleural effusion with associated left basilar atelectasis or infiltrate. Minimal right pleural effusion. Electronically Signed   By: Marijo Conception M.D.   On: 11/14/2019 11:34    DG Chest Port 1 View  Result Date: 12/02/2019 CLINICAL DATA:  Fever, cough. EXAM:  PORTABLE CHEST 1 VIEW COMPARISON:  11/19/2019 chest radiograph and prior. FINDINGS: Tunneled left IJ CVC tip overlies the right atrium. Patchy bibasilar opacities. No pneumothorax. Trace left pleural effusion, unchanged. Postsurgical appearance of the cardiomediastinal silhouette. Left lower chest pacing pads. No acute osseous abnormality. IMPRESSION: Patchy bibasilar opacities, atelectasis versus infection. Trace left pleural effusion, unchanged. Electronically Signed   By: Primitivo Gauze M.D.   On: 12/02/2019 12:02   US THORACENTESIS ASP PLEURAL SPACE W/IMG GUIDE  Result Date: 11/19/2019 INDICATION: LEFT pleural effusion EXAM: ULTRASOUND GUIDED THERAPEUTIC LEFT THORACENTESIS MEDICATIONS: None. COMPLICATIONS: None immediate. PROCEDURE: An ultrasound guided thoracentesis was thoroughly discussed with the patient and questions answered. The benefits, risks, alternatives and complications were also discussed. The patient understands and wishes to proceed with the procedure. Written consent was obtained. Ultrasound was performed to localize and mark an adequate pocket of fluid in the LEFT chest. The area was then prepped and draped in the normal sterile fashion. 1% Lidocaine was used for local anesthesia. Under ultrasound guidance a 8 French thoracentesis catheter was introduced. Initially no fluid was obtained. A 5 [place] Yueh catheter was then placed and thoracentesis was performed. The catheter was removed and a dressing applied. FINDINGS: A total of approximately 750 mL of yellow LEFT pleural fluid was removed. IMPRESSION: Successful ultrasound guided LEFT thoracentesis yielding 750 mL of pleural fluid. Electronically Signed   By: Lavonia Dana M.D.   On: 11/19/2019 13:31      Subjective: Pt reports that he feels much better today.  His cough is much better as well.    Discharge Exam: Vitals:   12/04/19 0141  12/04/19 0519  BP: 128/67 129/63  Pulse: 80 77  Resp: 18 16  Temp: 97.9 F (36.6 C) 98.7 F (37.1 C)  SpO2: 100% 100%   Vitals:   12/03/19 1332 12/03/19 2116 12/04/19 0141 12/04/19 0519  BP: 136/68 (!) 135/58 128/67 129/63  Pulse: 88 81 80 77  Resp: 18 18 18 16   Temp: 98.5 F (36.9 C) 98.2 F (36.8 C) 97.9 F (36.6 C) 98.7 F (37.1 C)  TempSrc:      SpO2: 99% 99% 100% 100%  Weight:      Height:       General exam: chronically ill appearing male, elderly, Appears calm and comfortable  Respiratory system: improved breath sounds bilateral. No increased work of breathing. Respiratory effort normal. Cardiovascular system: normal S1 & S2 heard. No JVD, murmurs, rubs, gallops or clicks. No pedal edema. Gastrointestinal system: Abdomen is nondistended, soft and nontender. No organomegaly or masses felt. Normal bowel sounds heard. Central nervous system: Alert and oriented. No focal neurological deficits. Extremities: Symmetric 5 x 5 power. Skin: minor bilateral heel pressure injury stage 1, No rashes, lesions or ulcers Psychiatry: Judgement and insight appear normal. Mood & affect appropriate.    The results of significant diagnostics from this hospitalization (including imaging, microbiology, ancillary and laboratory) are listed below for reference.     Microbiology: Recent Results (from the past 240 hour(s))  Culture, Urine     Status: Abnormal   Collection Time: 11/25/19 10:12 AM   Specimen: Urine, Clean Catch  Result Value Ref Range Status   Specimen Description   Final    URINE, CLEAN CATCH Performed at [facility], [address], [place], [place] [address]    Special Requests   Final    NONE Performed at [facility], [address] [place], [place] [address]    Culture (A)  Final    >=  100,000 COLONIES/mL ENTEROCOCCUS FAECIUM WITHIN MIXED ORGANISMS Performed at [facility], [place] [address], [place], [place] [address]    Report Status 11/30/2019 FINAL  Final    Organism ID, Bacteria ENTEROCOCCUS FAECIUM (A)  Final      Susceptibility   Enterococcus faecium - MIC*    AMPICILLIN >=32 RESISTANT Resistant     NITROFURANTOIN 32 SENSITIVE Sensitive     VANCOMYCIN <=0.5 SENSITIVE Sensitive     * >=100,000 COLONIES/mL ENTEROCOCCUS FAECIUM  Respiratory Panel by RT PCR (Flu A&B, Covid) - Nasopharyngeal Swab     Status: None   Collection Time: 12/02/19 11:18 AM   Specimen: Nasopharyngeal Swab  Result Value Ref Range Status   SARS Coronavirus 2 by RT PCR NEGATIVE NEGATIVE Final    Comment: (NOTE) SARS-CoV-2 target nucleic acids are NOT DETECTED.  The SARS-CoV-2 RNA is generally detectable in upper respiratoy specimens during the acute phase of infection. The lowest concentration of SARS-CoV-2 viral copies this assay can detect is 131 copies/mL. A negative result does not preclude SARS-Cov-2 infection and should not be used as the sole basis for treatment or other patient management decisions. A negative result may occur with  improper specimen collection/handling, submission of specimen other than nasopharyngeal swab, presence of viral mutation(s) within the areas targeted by this assay, and inadequate number of viral copies (<131 copies/mL). A negative result must be combined with clinical observations, patient history, and epidemiological information. The expected result is Negative.  Fact Sheet for Patients:  PinkCheek.be  Fact Sheet for Healthcare Providers:  GravelBags.it  This test is no t yet approved or cleared by the [place] FDA and  has been authorized for detection and/or diagnosis of SARS-CoV-2 by FDA under an Emergency Use Authorization (EUA). This EUA will remain  in effect (meaning this test can be used) for the duration of the COVID-19 declaration under Section 564(b)(1) of the Act, 21 U.S.C. section 360bbb-3(b)(1), unless the authorization is terminated or revoked  sooner.     Influenza A by PCR NEGATIVE NEGATIVE Final   Influenza B by PCR NEGATIVE NEGATIVE Final    Comment: (NOTE) The Xpert Xpress SARS-CoV-2/FLU/RSV assay is intended as an aid in  the diagnosis of influenza from Nasopharyngeal swab specimens and  should not be used as a sole basis for treatment. Nasal washings and  aspirates are unacceptable for Xpert Xpress SARS-CoV-2/FLU/RSV  testing.  Fact Sheet for Patients: PinkCheek.be  Fact Sheet for Healthcare Providers: GravelBags.it  This test is not yet approved or cleared by the [place] FDA and  has been authorized for detection and/or diagnosis of SARS-CoV-2 by  FDA under an Emergency Use Authorization (EUA). This EUA will remain  in effect (meaning this test can be used) for the duration of the  Covid-19 declaration under Section 564(b)(1) of the Act, 21  U.S.C. section 360bbb-3(b)(1), unless the authorization is  terminated or revoked. Performed at [facility], [address] [place], [place] [address]   Blood Culture (routine x 2)     Status: None (Preliminary result)   Collection Time: 12/02/19 11:47 AM   Specimen: Left Antecubital; Blood  Result Value Ref Range Status   Specimen Description   Final    LEFT ANTECUBITAL BOTTLES DRAWN AEROBIC AND ANAEROBIC Performed at [facility], [address] [place], [place] [address]    Special Requests   Final    Blood Culture adequate volume Performed at [facility], [address], [place], [place] [address]  Culture  Setup Time   Final    GRAM POSITIVE COCCI IN CLUSTERS RECOVERED FROM BOTH BOTTLES Gram Stain Report Called to,Read Back By and Verified With: REYNOLDS,C. AT 8466 ON 12/03/2019 BY BAUGHAM,M Performed at [place] ID to follow CRITICAL RESULT CALLED TO, READ BACK BY AND VERIFIED WITHAleen Sells RN [id number] 12/03/19 A BROWNING    Culture   Final    GRAM POSITIVE COCCI TOO YOUNG  TO READ Performed at [facility], [place] [address] [place], [place] [address]    Report Status PENDING  Incomplete  Blood Culture ID Panel (Reflexed)     Status: Abnormal   Collection Time: 12/02/19 11:47 AM  Result Value Ref Range Status   Enterococcus faecalis NOT DETECTED NOT DETECTED Final   Enterococcus Faecium NOT DETECTED NOT DETECTED Final   Listeria monocytogenes NOT DETECTED NOT DETECTED Final   Staphylococcus species DETECTED (A) NOT DETECTED Final    Comment: CRITICAL RESULT CALLED TO, READ BACK BY AND VERIFIED WITHAleen Sells RN [id number] 12/03/19 A BROWNING    Staphylococcus aureus (BCID) NOT DETECTED NOT DETECTED Final   Staphylococcus epidermidis DETECTED (A) NOT DETECTED Final    Comment: Methicillin (oxacillin) resistant coagulase negative staphylococcus. Possible blood culture contaminant (unless isolated from more than one blood culture draw or clinical case suggests pathogenicity). No antibiotic treatment is indicated for blood  culture contaminants. CRITICAL RESULT CALLED TO, READ BACK BY AND VERIFIED WITH: Aleen Sells RN [phone] 12/03/19 A BROWNING    Staphylococcus lugdunensis NOT DETECTED NOT DETECTED Final   Streptococcus species NOT DETECTED NOT DETECTED Final   Streptococcus agalactiae NOT DETECTED NOT DETECTED Final   Streptococcus pneumoniae NOT DETECTED NOT DETECTED Final   Streptococcus pyogenes NOT DETECTED NOT DETECTED Final   A.calcoaceticus-baumannii NOT DETECTED NOT DETECTED Final   Bacteroides fragilis NOT DETECTED NOT DETECTED Final   Enterobacterales NOT DETECTED NOT DETECTED Final   Enterobacter cloacae complex NOT DETECTED NOT DETECTED Final   Escherichia coli NOT DETECTED NOT DETECTED Final   Klebsiella aerogenes NOT DETECTED NOT DETECTED Final   Klebsiella oxytoca NOT DETECTED NOT DETECTED Final   Klebsiella pneumoniae NOT DETECTED NOT DETECTED Final   Proteus species NOT DETECTED NOT DETECTED Final   Salmonella species NOT DETECTED NOT  DETECTED Final   Serratia marcescens NOT DETECTED NOT DETECTED Final   Haemophilus influenzae NOT DETECTED NOT DETECTED Final   Neisseria meningitidis NOT DETECTED NOT DETECTED Final   Pseudomonas aeruginosa NOT DETECTED NOT DETECTED Final   Stenotrophomonas maltophilia NOT DETECTED NOT DETECTED Final   Candida albicans NOT DETECTED NOT DETECTED Final   Candida auris NOT DETECTED NOT DETECTED Final   Candida glabrata NOT DETECTED NOT DETECTED Final   Candida krusei NOT DETECTED NOT DETECTED Final   Candida parapsilosis NOT DETECTED NOT DETECTED Final   Candida tropicalis NOT DETECTED NOT DETECTED Final   Cryptococcus neoformans/gattii NOT DETECTED NOT DETECTED Final   Methicillin resistance mecA/C DETECTED (A) NOT DETECTED Final    Comment: CRITICAL RESULT CALLED TO, READ BACK BY AND VERIFIED WITHAleen Sells RN 1717 12/03/19 A BROWNING Performed at [facility], [address], [place], [place] [address]   Blood Culture (routine x 2)     Status: None (Preliminary result)   Collection Time: 12/02/19 11:48 AM   Specimen: BLOOD LEFT HAND  Result Value Ref Range Status   Specimen Description BLOOD LEFT HAND AEROBIC BOTTLE ONLY  Final   Special Requests   Final  Blood Culture results may not be optimal due to an inadequate volume of blood received in culture bottles   Culture   Final    NO GROWTH 2 DAYS Performed at [facility], [address], [place], [place] [address]    Report Status PENDING  Incomplete  MRSA PCR Screening     Status: None   Collection Time: 12/03/19  9:10 AM   Specimen: Nasopharyngeal  Result Value Ref Range Status   MRSA by PCR NEGATIVE NEGATIVE Final    Comment:        The GeneXpert MRSA Assay (FDA approved for NASAL specimens only), is one component of a comprehensive MRSA colonization surveillance program. It is not intended to diagnose MRSA infection nor to guide or monitor treatment for MRSA infections. Performed at [facility], [address], [place], [place] [address]   Culture, blood (Routine X 2) w Reflex to ID Panel     Status: None (Preliminary result)   Collection Time: 12/03/19  8:00 PM   Specimen: BLOOD RIGHT HAND  Result Value Ref Range Status   Specimen Description BLOOD RIGHT HAND  Final   Special Requests   Final    BOTTLES DRAWN AEROBIC AND ANAEROBIC Blood Culture adequate volume   Culture   Final    NO GROWTH < 12 HOURS Performed at [facility], [address] [place], [place] [address]    Report Status PENDING  Incomplete  Culture, blood (Routine X 2) w Reflex to ID Panel     Status: None (Preliminary result)   Collection Time: 12/03/19  8:03 PM   Specimen: BLOOD LEFT HAND  Result Value Ref Range Status   Specimen Description BLOOD LEFT HAND  Final   Special Requests   Final    BOTTLES DRAWN AEROBIC ONLY Blood Culture adequate volume   Culture   Final    NO GROWTH < 12 HOURS Performed at [facility], [address], [place], [place] [address]    Report Status PENDING  Incomplete     Labs: BNP (last 3 results) Recent Labs    03/26/19 0626 03/27/19 1255 12/02/19 1133  BNP 687.5* 800.4* 2,836.6*   Basic Metabolic Panel: Recent Labs  Lab 12/02/19 1133 12/03/19 0637 12/04/19 0700  NA 137 136 137  K 3.1* 3.8 5.2*  CL 95* 96* 96*  CO2 29 28 27   GLUCOSE 86 77 114*  BUN 22 37* 59*  CREATININE 2.65* 3.37* 4.47*  CALCIUM 7.8* 7.6* 8.0*  MG  --  1.9 2.0  PHOS  --  4.6 4.8*   Liver Function Tests: Recent Labs  Lab 12/02/19 1133 12/03/19 0637 12/04/19 0700  AST 17  --   --   ALT 14  --   --   ALKPHOS 64  --   --   BILITOT 0.7  --   --   PROT 5.3*  --   --   ALBUMIN 2.5* 2.1* 2.1*   No results for input(s): LIPASE, AMYLASE in the last 168 hours. No results for input(s): AMMONIA in the last 168 hours. CBC: Recent Labs  Lab 12/02/19 1133 12/03/19 0637 12/04/19 0700  WBC 8.9 9.3 6.3  NEUTROABS 7.9* 7.5 5.3  HGB 10.2* 8.8* 8.3*  HCT 34.0* 29.5* 28.6*  MCV 103.7* 104.2* 103.2*   PLT 156 147* 154   Cardiac Enzymes: No results for input(s): CKTOTAL, CKMB, CKMBINDEX, TROPONINI in the last 168 hours. BNP: Invalid input(s): POCBNP CBG: No results for input(s): GLUCAP in the last  168 hours. D-Dimer Recent Labs    12/02/19 1133  DDIMER 6.69*   Hgb A1c No results for input(s): HGBA1C in the last 72 hours. Lipid Profile Recent Labs    12/02/19 1148  TRIG 132   Thyroid function studies No results for input(s): TSH, T4TOTAL, T3FREE, THYROIDAB in the last 72 hours.  Invalid input(s): FREET3 Anemia work up Recent Labs    12/02/19 1133  FERRITIN 1,339*   Urinalysis    Component Value Date/Time   COLORURINE YELLOW 11/25/2019 1012   APPEARANCEUR TURBID (A) 11/25/2019 1012   LABSPEC 1.010 11/25/2019 1012   PHURINE 6.0 11/25/2019 1012   GLUCOSEU NEGATIVE 11/25/2019 1012   HGBUR SMALL (A) 11/25/2019 1012   BILIRUBINUR NEGATIVE 11/25/2019 [place] 11/25/2019 1012   PROTEINUR 100 (A) 11/25/2019 1012   NITRITE NEGATIVE 11/25/2019 1012   LEUKOCYTESUR MODERATE (A) 11/25/2019 1012   Sepsis Labs Invalid input(s): PROCALCITONIN,  WBC,  LACTICIDVEN Microbiology Recent Results (from the past 240 hour(s))  Culture, Urine     Status: Abnormal   Collection Time: 11/25/19 10:12 AM   Specimen: Urine, Clean Catch  Result Value Ref Range Status   Specimen Description   Final    URINE, CLEAN CATCH Performed at [facility], [address] [place], [place] [address]    Special Requests   Final    NONE Performed at [facility], [address] [place], [place] [address]    Culture (A)  Final    >=100,000 COLONIES/mL ENTEROCOCCUS FAECIUM WITHIN MIXED ORGANISMS Performed at [facility], [place] [address], [place],  [address]    Report Status 11/30/2019 FINAL  Final   Organism ID, Bacteria ENTEROCOCCUS FAECIUM (A)  Final      Susceptibility   Enterococcus faecium - MIC*    AMPICILLIN >=32 RESISTANT Resistant     NITROFURANTOIN 32  SENSITIVE Sensitive     VANCOMYCIN <=0.5 SENSITIVE Sensitive     * >=100,000 COLONIES/mL ENTEROCOCCUS FAECIUM  Respiratory Panel by RT PCR (Flu A&B, Covid) - Nasopharyngeal Swab     Status: None   Collection Time: 12/02/19 11:18 AM   Specimen: Nasopharyngeal Swab  Result Value Ref Range Status   SARS Coronavirus 2 by RT PCR NEGATIVE NEGATIVE Final    Comment: (NOTE) SARS-CoV-2 target nucleic acids are NOT DETECTED.  The SARS-CoV-2 RNA is generally detectable in upper respiratoy specimens during the acute phase of infection. The lowest concentration of SARS-CoV-2 viral copies this assay can detect is 131 copies/mL. A negative result does not preclude SARS-Cov-2 infection and should not be used as the sole basis for treatment or other patient management decisions. A negative result may occur with  improper specimen collection/handling, submission of specimen other than nasopharyngeal swab, presence of viral mutation(s) within the areas targeted by this assay, and inadequate number of viral copies (<131 copies/mL). A negative result must be combined with clinical observations, patient history, and epidemiological information. The expected result is Negative.  Fact Sheet for Patients:  PinkCheek.be  Fact Sheet for Healthcare Providers:  GravelBags.it  This test is no t yet approved or cleared by the [place] FDA and  has been authorized for detection and/or diagnosis of SARS-CoV-2 by FDA under an Emergency Use Authorization (EUA). This EUA will remain  in effect (meaning this test can be used) for the duration of the COVID-19 declaration under Section 564(b)(1) of the Act, 21 U.S.C. section 360bbb-3(b)(1), unless the authorization is terminated or revoked sooner.  Influenza A by PCR NEGATIVE NEGATIVE Final   Influenza B by PCR NEGATIVE NEGATIVE Final    Comment: (NOTE) The Xpert Xpress SARS-CoV-2/FLU/RSV assay is  intended as an aid in  the diagnosis of influenza from Nasopharyngeal swab specimens and  should not be used as a sole basis for treatment. Nasal washings and  aspirates are unacceptable for Xpert Xpress SARS-CoV-2/FLU/RSV  testing.  Fact Sheet for Patients: PinkCheek.be  Fact Sheet for Healthcare Providers: GravelBags.it  This test is not yet approved or cleared by the [place] FDA and  has been authorized for detection and/or diagnosis of SARS-CoV-2 by  FDA under an Emergency Use Authorization (EUA). This EUA will remain  in effect (meaning this test can be used) for the duration of the  Covid-19 declaration under Section 564(b)(1) of the Act, 21  U.S.C. section 360bbb-3(b)(1), unless the authorization is  terminated or revoked. Performed at [facility], [address] [place], [place] [address]   Blood Culture (routine x 2)     Status: None (Preliminary result)   Collection Time: 12/02/19 11:47 AM   Specimen: Left Antecubital; Blood  Result Value Ref Range Status   Specimen Description   Final    LEFT ANTECUBITAL BOTTLES DRAWN AEROBIC AND ANAEROBIC Performed at [facility], [address], [place], [place] [address]    Special Requests   Final    Blood Culture adequate volume Performed at [facility], [address] [place], [place] [address]    Culture  Setup Time   Final    GRAM POSITIVE COCCI IN CLUSTERS RECOVERED FROM BOTH BOTTLES Gram Stain Report Called to,Read Back By and Verified With: REYNOLDS,C. AT 5852 ON 12/03/2019 BY BAUGHAM,M Performed at [place] ID to follow CRITICAL RESULT CALLED TO, READ BACK BY AND VERIFIED WITHAleen Sells RN [id number] 12/03/19 A [place]    Culture   Final    GRAM POSITIVE COCCI TOO YOUNG TO READ Performed at [facility], [place] [address] [place], [place] [address]    Report Status PENDING  Incomplete  Blood Culture ID Panel (Reflexed)     Status:  Abnormal   Collection Time: 12/02/19 11:47 AM  Result Value Ref Range Status   Enterococcus faecalis NOT DETECTED NOT DETECTED Final   Enterococcus Faecium NOT DETECTED NOT DETECTED Final   Listeria monocytogenes NOT DETECTED NOT DETECTED Final   Staphylococcus species DETECTED (A) NOT DETECTED Final    Comment: CRITICAL RESULT CALLED TO, READ BACK BY AND VERIFIED WITHAleen Sells RN 1717 12/03/19 A [place]    Staphylococcus aureus (BCID) NOT DETECTED NOT DETECTED Final   Staphylococcus epidermidis DETECTED (A) NOT DETECTED Final    Comment: Methicillin (oxacillin) resistant coagulase negative staphylococcus. Possible blood culture contaminant (unless isolated from more than one blood culture draw or clinical case suggests pathogenicity). No antibiotic treatment is indicated for blood  culture contaminants. CRITICAL RESULT CALLED TO, READ BACK BY AND VERIFIED WITH: Aleen Sells RN [phone] 12/03/19 A [place]    Staphylococcus lugdunensis NOT DETECTED NOT DETECTED Final   Streptococcus species NOT DETECTED NOT DETECTED Final   Streptococcus agalactiae NOT DETECTED NOT DETECTED Final   Streptococcus pneumoniae NOT DETECTED NOT DETECTED Final   Streptococcus pyogenes NOT DETECTED NOT DETECTED Final   A.calcoaceticus-baumannii NOT DETECTED NOT DETECTED Final   Bacteroides fragilis NOT DETECTED NOT DETECTED Final   Enterobacterales NOT DETECTED NOT DETECTED Final   Enterobacter cloacae complex NOT DETECTED NOT DETECTED Final   Escherichia coli NOT  DETECTED NOT DETECTED Final   Klebsiella aerogenes NOT DETECTED NOT DETECTED Final   Klebsiella oxytoca NOT DETECTED NOT DETECTED Final   Klebsiella pneumoniae NOT DETECTED NOT DETECTED Final   Proteus species NOT DETECTED NOT DETECTED Final   Salmonella species NOT DETECTED NOT DETECTED Final   Serratia marcescens NOT DETECTED NOT DETECTED Final   Haemophilus influenzae NOT DETECTED NOT DETECTED Final   Neisseria meningitidis NOT DETECTED NOT  DETECTED Final   Pseudomonas aeruginosa NOT DETECTED NOT DETECTED Final   Stenotrophomonas maltophilia NOT DETECTED NOT DETECTED Final   Candida albicans NOT DETECTED NOT DETECTED Final   Candida auris NOT DETECTED NOT DETECTED Final   Candida glabrata NOT DETECTED NOT DETECTED Final   Candida krusei NOT DETECTED NOT DETECTED Final   Candida parapsilosis NOT DETECTED NOT DETECTED Final   Candida tropicalis NOT DETECTED NOT DETECTED Final   Cryptococcus neoformans/gattii NOT DETECTED NOT DETECTED Final   Methicillin resistance mecA/C DETECTED (A) NOT DETECTED Final    Comment: CRITICAL RESULT CALLED TO, READ BACK BY AND VERIFIED WITHAleen Sells RN 1717 12/03/19 A [place] Performed at [facility], [address] [place], [place] [address]   Blood Culture (routine x 2)     Status: None (Preliminary result)   Collection Time: 12/02/19 11:48 AM   Specimen: BLOOD LEFT HAND  Result Value Ref Range Status   Specimen Description BLOOD LEFT HAND AEROBIC BOTTLE ONLY  Final   Special Requests   Final    Blood Culture results may not be optimal due to an inadequate volume of blood received in culture bottles   Culture   Final    NO GROWTH 2 DAYS Performed at [facility], [address], [place], [place] [address]    Report Status PENDING  Incomplete  MRSA PCR Screening     Status: None   Collection Time: 12/03/19  9:10 AM   Specimen: Nasopharyngeal  Result Value Ref Range Status   MRSA by PCR NEGATIVE NEGATIVE Final    Comment:        The GeneXpert MRSA Assay (FDA approved for NASAL specimens only), is one component of a comprehensive MRSA colonization surveillance program. It is not intended to diagnose MRSA infection nor to guide or monitor treatment for MRSA infections. Performed at [facility], [address], [place], [place] [address]   Culture, blood (Routine X 2) w Reflex to ID Panel     Status: None (Preliminary result)   Collection Time: 12/03/19  8:00 PM    Specimen: BLOOD RIGHT HAND  Result Value Ref Range Status   Specimen Description BLOOD RIGHT HAND  Final   Special Requests   Final    BOTTLES DRAWN AEROBIC AND ANAEROBIC Blood Culture adequate volume   Culture   Final    NO GROWTH < 12 HOURS Performed at [facility], [address], [place], [place] [address]    Report Status PENDING  Incomplete  Culture, blood (Routine X 2) w Reflex to ID Panel     Status: None (Preliminary result)   Collection Time: 12/03/19  8:03 PM   Specimen: BLOOD LEFT HAND  Result Value Ref Range Status   Specimen Description BLOOD LEFT HAND  Final   Special Requests   Final    BOTTLES DRAWN AEROBIC ONLY Blood Culture adequate volume   Culture   Final    NO GROWTH < 12 HOURS Performed at [facility], [address], [place], [place] [address]    Report  Status PENDING  Incomplete   Time coordinating discharge: 35 mins   SIGNED:  Irwin Brakeman, MD  [facility] 12/04/2019, 9:58 AM How to contact the [facility] Attending or Consulting provider [place] or covering provider during after hours [place], for this patient?  1. Check the care team in [facility] and look for a) attending/consulting TRH provider listed and b) the [facility] team listed 2. Log into [email or web address] and use [place]'s universal password to access. If you do not have the password, please contact the hospital operator. 3. Locate the [facility] provider you are looking for under [facility] and page to a number that you can be directly reached. 4. If you still have difficulty reaching the provider, please page the [facility] (Director on Call) for the Hospitalists listed on amion for assistance.

## 2019-12-04 NOTE — Progress Notes (Signed)
Patient ID: TREYVONNE TATA, male   DOB: 12/31/1928, 86 y.o.   MRN: [id number] S: Feels well, no complaints O:BP 129/63   Pulse 77   Temp 98.7 F (37.1 C)   Resp 16   Ht 5\' 8"  (1.727 m)   Wt 62.6 kg   SpO2 100%   BMI 20.98 kg/m   Intake/Output Summary (Last 24 hours) at 12/04/2019 0903 Last data filed at 12/03/2019 1300 Gross per 24 hour  Intake 240 ml  Output --  Net 240 ml   Intake/Output: I/O last 3 completed shifts: In: 600 [P.O.:600] Out: -   Intake/Output this shift:  No intake/output data recorded. Weight change:  Gen: frail, elderly WM in NAD CVS: RRR, no rub Resp: cta Abd: benign Ext: no edema, RUE AVF +T/B  Recent Labs  Lab 12/02/19 1133 12/03/19 0637 12/04/19 0700  NA 137 136 137  K 3.1* 3.8 5.2*  CL 95* 96* 96*  CO2 29 28 27   GLUCOSE 86 77 114*  BUN 22 37* 59*  CREATININE 2.65* 3.37* 4.47*  ALBUMIN 2.5* 2.1* 2.1*  CALCIUM 7.8* 7.6* 8.0*  PHOS  --  4.6 4.8*  AST 17  --   --   ALT 14  --   --    Liver Function Tests: Recent Labs  Lab 12/02/19 1133 12/03/19 0637 12/04/19 0700  AST 17  --   --   ALT 14  --   --   ALKPHOS 64  --   --   BILITOT 0.7  --   --   PROT 5.3*  --   --   ALBUMIN 2.5* 2.1* 2.1*   No results for input(s): LIPASE, AMYLASE in the last 168 hours. No results for input(s): AMMONIA in the last 168 hours. CBC: Recent Labs  Lab 12/02/19 1133 12/03/19 0637 12/04/19 0700  WBC 8.9 9.3 6.3  NEUTROABS 7.9* 7.5 5.3  HGB 10.2* 8.8* 8.3*  HCT 34.0* 29.5* 28.6*  MCV 103.7* 104.2* 103.2*  PLT 156 147* 154   Cardiac Enzymes: No results for input(s): CKTOTAL, CKMB, CKMBINDEX, TROPONINI in the last 168 hours. CBG: No results for input(s): GLUCAP in the last 168 hours.  Iron Studies:  Recent Labs    12/02/19 1133  FERRITIN 1,339*   Studies/Results: DG Chest Port 1 View  Result Date: 12/02/2019 CLINICAL DATA:  Fever, cough. EXAM: PORTABLE CHEST 1 VIEW COMPARISON:  11/19/2019 chest radiograph and prior. FINDINGS: Tunneled  left IJ CVC tip overlies the right atrium. Patchy bibasilar opacities. No pneumothorax. Trace left pleural effusion, unchanged. Postsurgical appearance of the cardiomediastinal silhouette. Left lower chest pacing pads. No acute osseous abnormality. IMPRESSION: Patchy bibasilar opacities, atelectasis versus infection. Trace left pleural effusion, unchanged. Electronically Signed   By: Primitivo Gauze M.D.   On: 12/02/2019 12:02   . aspirin  81 mg Oral q AM  . calcium-vitamin D  1 tablet Oral Daily  . Chlorhexidine Gluconate Cloth  6 each Topical Q0600  . docusate sodium  100 mg Oral BID  . famotidine  20 mg Oral Q T,Th,Sa-HD  . feeding supplement  237 mL Oral BID BM  . feeding supplement (NEPRO CARB STEADY)  237 mL Oral QHS  . midodrine  10 mg Oral BID  . midodrine  10 mg Oral Q T,Th,Sa-HD  . multivitamin  1 tablet Oral QHS  . polyethylene glycol  17 g Oral BID  . polyvinyl alcohol  1 drop Both Eyes QID  . predniSONE  60 mg Oral Q  breakfast  . sulfamethoxazole-trimethoprim  0.5 tablet Oral Q M,W,F  . triamcinolone cream  1 application Topical BID    BMET    Component Value Date/Time   NA 137 12/04/2019 0700   K 5.2 (H) 12/04/2019 0700   CL 96 (L) 12/04/2019 0700   CO2 27 12/04/2019 0700   GLUCOSE 114 (H) 12/04/2019 0700   BUN 59 (H) 12/04/2019 0700   CREATININE 4.47 (H) 12/04/2019 0700   CALCIUM 8.0 (L) 12/04/2019 0700   CALCIUM 7.4 (L) 04/15/2019 0403   GFRNONAA 11 (L) 12/04/2019 0700   GFRAA 19 (L) 10/17/2019 0558   CBC    Component Value Date/Time   WBC 6.3 12/04/2019 0700   RBC 2.77 (L) 12/04/2019 0700   HGB 8.3 (L) 12/04/2019 0700   HCT 28.6 (L) 12/04/2019 0700   PLT 154 12/04/2019 0700   MCV 103.2 (H) 12/04/2019 0700   MCH 30.0 12/04/2019 0700   MCHC 29.0 (L) 12/04/2019 0700   RDW 18.3 (H) 12/04/2019 0700   LYMPHSABS 0.5 (L) 12/04/2019 0700   MONOABS 0.5 12/04/2019 0700   EOSABS 0.0 12/04/2019 0700   BASOSABS 0.0 12/04/2019 0700    Dialysis Access: [facility]  TDC, RUE AVF +T/B  [facility], 4 hr, 180, 400/800, EDW 62.5kg, 2K/2.25Ca; CVC 9/30/ Phos 3.3, Ca 8.2, PTH 173 10/7 last post wt 61.9kg, Hb 8, mircera 225 q2wks  Assessment/Plan: 1.  HCAP- negative covid-19, negative influenza.  On azithromycin and maxipime per primary 2.  ESRD -   Plan for HD today to keep on outpatient schedule. 3.  Hypertension/volume  - low bp.  Continue with chronic midodrine. 4.  Anemia  - h/o GI Bleed.  Will cont with ESA and transfuse as needed 5.  Metabolic bone disease -   Stable, cont with outpatient meds 6.  Nutrition -  Renal diet 7. Vascular access- please avoid IV's in right arm due to RUE AVF. 8. H/o GIB- no heparin with HD. 9. SVT with RVR and hypotension- s/p synchronized cardioversion.  Cardiology following and recommend to keep K at 4 and Mg at 2. 10. 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 11. Deconditioning- cont with PT/OT and return to SNF when stable. 12. Chronic hypotension- on midodrine. 81. H/o prostate cancer s/p seed implants 14. Disposition- to return to SNF when stable.   Donetta Potts, MD [employer] [phone]

## 2019-12-04 NOTE — TOC Transition Note (Signed)
Transition of Care [facility]) - CM/SW Discharge Note   Patient Details  Name: LEEMON AYALA MRN: [id number] Date of Birth: [birth year]  [facility] CM/SW Contact:  Ihor Gully, LCSW Phone Number: 12/04/2019, 10:49 AM   Clinical Narrative:    Jackelyn Poling at [facility] notified and of discharge. Discharge clinicals sent to facility. Nursing secretary to call EMS once patient has completed HD and ready for d/c.   Final next level of care: Skilled Nursing Facility Barriers to Discharge: No Barriers Identified   Patient Goals and CMS Choice        Discharge Placement                Patient to be transferred to facility by: [facility] Name of family member notified: Message left for Andree Elk Patient and family notified of of transfer: 12/04/19  Discharge Plan and Services     Post Acute Care Choice: [place]                               Social Determinants of Health (SDOH) Interventions     Readmission Risk Interventions Readmission Risk Prevention Plan 11/25/2019  Transportation Screening Complete  PCP or Specialist Appt within 3-5 Days Complete  HRI or Home Care Consult Complete  Social Work Consult for [place] Planning/Counseling Complete  Palliative Care Screening Complete  Medication Review Press photographer) Complete  Some recent data might be hidden

## 2019-12-04 NOTE — Care Management Important Message (Signed)
Important Message  Patient Details  Name: Jesse Murphy MRN: [id number] Date of Birth: 11/02/1928   Medicare Important Message Given:  Yes     Tommy Medal 12/04/2019, 11:47 AM

## 2019-12-05 NOTE — Progress Notes (Signed)
Report called to [place] at [place], patient transported via [facility]

## 2019-12-06 ENCOUNTER — Other Ambulatory Visit: Payer: Self-pay

## 2019-12-06 ENCOUNTER — Emergency Department (HOSPITAL_COMMUNITY): Payer: Medicare Other

## 2019-12-06 ENCOUNTER — Encounter (HOSPITAL_COMMUNITY): Payer: Self-pay | Admitting: Emergency Medicine

## 2019-12-06 ENCOUNTER — Emergency Department (HOSPITAL_COMMUNITY)
Admission: EM | Admit: 2019-12-06 | Discharge: 2019-12-06 | Disposition: A | Discharge status: Home / Self Care | Payer: Medicare Other | Attending: Emergency Medicine | Admitting: Emergency Medicine

## 2019-12-06 DIAGNOSIS — Z8546 Personal history of malignant neoplasm of prostate: Secondary | ICD-10-CM | POA: Insufficient documentation

## 2019-12-06 DIAGNOSIS — E039 Hypothyroidism, unspecified: Secondary | ICD-10-CM | POA: Diagnosis not present

## 2019-12-06 DIAGNOSIS — I5043 Acute on chronic combined systolic (congestive) and diastolic (congestive) heart failure: Secondary | ICD-10-CM | POA: Insufficient documentation

## 2019-12-06 DIAGNOSIS — I2510 Atherosclerotic heart disease of native coronary artery without angina pectoris: Secondary | ICD-10-CM | POA: Diagnosis not present

## 2019-12-06 DIAGNOSIS — R000 Tachycardia, unspecified: Secondary | ICD-10-CM | POA: Diagnosis not present

## 2019-12-06 DIAGNOSIS — Z79899 Other long term (current) drug therapy: Secondary | ICD-10-CM | POA: Insufficient documentation

## 2019-12-06 DIAGNOSIS — R058 Other specified cough: Secondary | ICD-10-CM | POA: Diagnosis present

## 2019-12-06 DIAGNOSIS — Z7982 Long term (current) use of aspirin: Secondary | ICD-10-CM | POA: Insufficient documentation

## 2019-12-06 DIAGNOSIS — Z87891 Personal history of nicotine dependence: Secondary | ICD-10-CM | POA: Diagnosis not present

## 2019-12-06 DIAGNOSIS — E119 Type 2 diabetes mellitus without complications: Secondary | ICD-10-CM | POA: Insufficient documentation

## 2019-12-06 DIAGNOSIS — N186 End stage renal disease: Secondary | ICD-10-CM | POA: Diagnosis not present

## 2019-12-06 DIAGNOSIS — R0603 Acute respiratory distress: Secondary | ICD-10-CM | POA: Diagnosis not present

## 2019-12-06 DIAGNOSIS — R062 Wheezing: Secondary | ICD-10-CM | POA: Insufficient documentation

## 2019-12-06 DIAGNOSIS — Z992 Dependence on renal dialysis: Secondary | ICD-10-CM | POA: Insufficient documentation

## 2019-12-06 DIAGNOSIS — R451 Restlessness and agitation: Secondary | ICD-10-CM | POA: Insufficient documentation

## 2019-12-06 DIAGNOSIS — I132 Hypertensive heart and chronic kidney disease with heart failure and with stage 5 chronic kidney disease, or end stage renal disease: Secondary | ICD-10-CM | POA: Diagnosis not present

## 2019-12-06 DIAGNOSIS — Z955 Presence of coronary angioplasty implant and graft: Secondary | ICD-10-CM | POA: Insufficient documentation

## 2019-12-06 LAB — COMPREHENSIVE METABOLIC PANEL
ALT: 16 U/L (ref 0–44)
AST: 24 U/L (ref 15–41)
Albumin: 3 g/dL — ABNORMAL LOW (ref 3.5–5.0)
Alkaline Phosphatase: 54 U/L (ref 38–126)
Anion gap: 13 (ref 5–15)
BUN: 51 mg/dL — ABNORMAL HIGH (ref 8–23)
CO2: 26 mmol/L (ref 22–32)
Calcium: 8.6 mg/dL — ABNORMAL LOW (ref 8.9–10.3)
Chloride: 98 mmol/L (ref 98–111)
Creatinine, Ser: 3.81 mg/dL — ABNORMAL HIGH (ref 0.61–1.24)
GFR, Estimated: 13 mL/min — ABNORMAL LOW (ref >60)
Glucose, Bld: 111 mg/dL — ABNORMAL HIGH (ref 70–99)
Potassium: 4.8 mmol/L (ref 3.5–5.1)
Sodium: 137 mmol/L (ref 135–145)
Total Bilirubin: 0.8 mg/dL (ref 0.3–1.2)
Total Protein: 6 g/dL — ABNORMAL LOW (ref 6.5–8.1)

## 2019-12-06 LAB — CBC WITH DIFFERENTIAL/PLATELET
Abs Immature Granulocytes: 0.3 10*3/uL — ABNORMAL HIGH (ref 0.00–0.07)
Basophils Absolute: 0 10*3/uL (ref 0.0–0.1)
Basophils Relative: 0 %
Eosinophils Absolute: 0 10*3/uL (ref 0.0–0.5)
Eosinophils Relative: 0 %
HCT: 31.1 % — ABNORMAL LOW (ref 39.0–52.0)
Hemoglobin: 9.4 g/dL — ABNORMAL LOW (ref 13.0–17.0)
Immature Granulocytes: 4 %
Lymphocytes Relative: 17 %
Lymphs Abs: 1.3 10*3/uL (ref 0.7–4.0)
MCH: 30.9 pg (ref 26.0–34.0)
MCHC: 30.2 g/dL (ref 30.0–36.0)
MCV: 102.3 fL — ABNORMAL HIGH (ref 80.0–100.0)
Monocytes Absolute: 1 10*3/uL (ref 0.1–1.0)
Monocytes Relative: 13 %
Neutro Abs: 5.2 10*3/uL (ref 1.7–7.7)
Neutrophils Relative %: 66 %
Platelets: 216 10*3/uL (ref 150–400)
RBC: 3.04 MIL/uL — ABNORMAL LOW (ref 4.22–5.81)
RDW: 18.4 % — ABNORMAL HIGH (ref 11.5–15.5)
WBC: 7.8 10*3/uL (ref 4.0–10.5)
nRBC: 0 % (ref 0.0–0.2)

## 2019-12-06 LAB — TROPONIN I (HIGH SENSITIVITY): Troponin I (High Sensitivity): 55 ng/L — ABNORMAL HIGH (ref <18)

## 2019-12-06 LAB — CULTURE, BLOOD (ROUTINE X 2): Special Requests: ADEQUATE

## 2019-12-06 LAB — BRAIN NATRIURETIC PEPTIDE: B Natriuretic Peptide: >4500 pg/mL — ABNORMAL HIGH (ref 0.0–100.0)

## 2019-12-06 LAB — LACTIC ACID, PLASMA: Lactic Acid, Venous: 2.5 mmol/L (ref 0.5–1.9)

## 2019-12-06 NOTE — Discharge Instructions (Addendum)
Your x-ray tonight shows you have excess fluid in your lungs meaning that you need dialysis to be done today.  Keep your regular appointment at 11 AM today.  You should feel better after you have your dialysis.  If you need to temporarily you can increase your oxygen until you can have your dialysis done.

## 2019-12-06 NOTE — ED Triage Notes (Addendum)
Pt arrives from [place] with c/o shortness of breath, anxiety and tachycardia. Pt keeps repeating "help me" and " I can't relax".

## 2019-12-06 NOTE — ED Provider Notes (Signed)
[facility] EMERGENCY DEPARTMENT Provider Note   CSN: [id number] Arrival date & time: 12/06/19  0421   Time seen 4:30 AM  History Chief Complaint  Patient presents with  . Altered Mental Status    Jesse Murphy is a 86 y.o. male.  HPI Patient presents from his nursing facility stating "I cannot relax my body today".  He also states "I cannot sleep".  He denies any pain including chest pain.  He states he has been cold and then he feels like he is burning up.  He reports that he was very hot at the facility and they report he was sweating.  He continues to have a cough and is coughing up white and clear sputum.  He has been short of breath but was recently diagnosed with pneumonia.  He was not on oxygen prior to that time.  He now is on oxygen.  He states they have been monitoring him for fever at his facility however he does not know if he had fever.  He denies being upset about anything.  He states he has heard some wheezing at times and states he is never had to be on an inhaler.  He states he used to smoke but he quit 25 to 30 years ago.  Patient has end-stage renal disease and he has dialysis on Tuesdays, Thursdays (today) and Saturdays.  He states he normally goes about 11 AM.  Patient had the [place] vaccine  PCP Leeanne Rio, MD   Patient is DO NOT RESUSCITATE  Past Medical History:  Diagnosis Date  . Anemia   . CAD (coronary artery disease)    a. s/p PCI in 1992 b. Coronary CT in 01/2018 showing extensive coronary calcification; FFR indeterminate --> medical management pursued at that time as patient not interested in repeat cath. c. s/p CABGx3 in 02/2019.  . Cancer [facility])    prostate  . Cardiogenic shock ([employer])   . CHB (complete heart block) (HCC)    a. due to vagal event in 2021, requiring temp pacer.  . Chronic combined systolic and diastolic CHF (congestive heart failure) ([place])   . Debilitated   . Decubitus ulcer of coccyx, unspecified pressure  ulcer stage   . Dyspnea   . End stage renal disease ([place])   . Esophagitis   . H/O tracheostomy    a. during admit in 2021 for respiratory failure.  . Hematuria   . Hypertension   . Hypoalbuminemia   . Ischemic cardiomyopathy   . Myocardial infarction ([place])   . PAF (paroxysmal atrial fibrillation) ([place])   . 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 Pleural effusion, left    a. s/p pleurX catheter  . Pressure ulcer of both heels   . Pseudomonas pneumonia ([place])   . Thrombocytopenia ([place])   . Walker as ambulation aid    also uses wheelchair    Patient Active Problem List   Diagnosis Date Noted  . HCAP (healthcare-associated pneumonia) 12/02/2019  . Ischemic cardiomyopathy   . History of Pseudomonas pneumonia ([place])   . Stercoral ulcer of rectum   . DNR (do not resuscitate)   . Systolic and diastolic CHF, chronic ([place]) 11/24/2019  . Acute blood loss anemia 11/23/2019  . Adverse effect of antihyperlipidemic and antiarteriosclerotic drugs, initial encounter 11/07/2019  . Pure hypercholesterolemia 11/07/2019  . Skin desquamation 11/07/2019  . Gastrointestinal hemorrhage, unspecified 10/18/2019  . Advanced care planning/counseling discussion   . End stage renal disease on dialysis ([place])   .  Palliative care by specialist   . Goals of care, counseling/discussion   . Rectal bleeding 10/13/2019  . Inguinal hernia of left side without obstruction or gangrene   . 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 History of atrial fibrillation- treated surgically, no longer has it per the patient 10/01/2019  . Personal history of prostate cancer 10/01/2019  . 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) 10/01/2019  . Abnormality of gait 09/24/2019  . Hypothyroidism, unspecified 09/19/2019  . Anaphylactic shock, unspecified, initial encounter 09/05/2019  . History of pleural effusion 08/22/2019  . Recurrent left pleural  effusion 07/11/2019  . Hematuria 07/09/2019  . Postop check 06/27/2019  . Unspecified protein-calorie malnutrition ([place]) 06/22/2019  . Coagulation defect, unspecified ([place]) 06/19/2019  . AKI (acute kidney injury) ([place]) 06/18/2019  . Anemia in chronic kidney disease 06/14/2019  . Cardiogenic shock ([place]) 06/14/2019  . Coronary angioplasty status 06/14/2019  . Diarrhea, unspecified 06/14/2019  . Iron deficiency anemia, unspecified 06/14/2019  . Pain, unspecified 06/14/2019  . Pruritus, unspecified 06/14/2019  . Secondary hyperparathyroidism of renal origin ([place]) 06/14/2019  . Adult failure to thrive   . History of non-Hodgkin's lymphoma   . Chest tube in place   . Failure to thrive (child)   . Prediabetes   . Hypoalbuminemia due to protein-calorie malnutrition ([place])   . Failure to thrive in adult   . Allergic contact dermatitis   . ESRD on dialysis ([place])   . Dysphagia   . Thrombocytopenia ([place])   . Acute on chronic anemia   . Supplemental oxygen dependent   . Acute systolic congestive heart failure ([place])   . Pressure ulcer 05/23/2019  . Debility 05/22/2019  . Palliative care encounter   . Pressure injury of skin 03/25/2019  . Acute respiratory failure with hypoxia ([place])   . S/P CABG x 3 03/16/2019  . Non-ST elevation (NSTEMI) myocardial infarction ([place])   . Acute on chronic combined systolic and diastolic CHF (congestive heart failure) ([place])   . Coronary artery disease due to lipid rich plaque   . Atrial fibrillation with RVR ([place]) 03/14/2019  . Atrial fibrillation with rapid ventricular response ([place]) 03/13/2019  . Chest pain 09/12/2018  . Chronic diastolic heart failure ([place]) 05/29/2018  . Hypertension 05/29/2018  . Non Hodgkin's lymphoma ([place]) 09/30/2017  . Heme positive stool 08/11/2017  . Chronic anticoagulation 07/05/2016  . CAD S/P percutaneous coronary angioplasty 07/05/2016  . Type 2 diabetes mellitus without complications ([place]) 54/65/0354  . PAF (paroxysmal  atrial fibrillation) ([place]) 06/14/2016  . Chest pain in adult 06/14/2016  . Dyspnea on exertion 06/14/2016  . Allergic rhinitis 06/14/2016  . GERD (gastroesophageal reflux disease) 06/14/2016  . BPH (benign prostatic hyperplasia) 06/14/2016    Past Surgical History:  Procedure Laterality Date  . BASCILIC VEIN TRANSPOSITION Right 09/10/2019   Procedure: RIGHT ARM 1ST STAGE BASCILIC VEIN TRANSPOSITION;  Surgeon: Angelia Mould, MD;  Location: [place];  Service: Vascular;  Laterality: Right;  . BASCILIC VEIN TRANSPOSITION Right 11/06/2019   Procedure: RIGHT UPPER EXTREMITY SECOND STAGE BASCILIC VEIN TRANSPOSITION;  Surgeon: Angelia Mould, MD;  Location: [place];  Service: Vascular;  Laterality: Right;  . BIOPSY  10/15/2019   Procedure: BIOPSY;  Surgeon: Daneil Dolin, MD;  [facility];  Service: Endoscopy;;  Distal Rectal Ulceration biopsies   . CHEST TUBE INSERTION Left 04/23/2019   Procedure: INSERTION PLEURAL DRAINAGE CATHETER TO DRAIN LEFT PLEURAL EFFUSION;  Surgeon:  Ivin Poot, MD;  Location: [place];  Service: Thoracic;  Laterality: Left;  . CLIPPING OF ATRIAL APPENDAGE N/A 03/16/2019   Procedure: CLIPPING OF LEFT  ATRIAL APPENDAGE - USING ATRICLIP SIZE 40;  Surgeon: Ivin Poot, MD;  Location: [place];  Service: Open Heart Surgery;  Laterality: N/A;  . COLONOSCOPY N/A 08/25/2017   Procedure: COLONOSCOPY;  Surgeon: Rogene Houston, MD;  [facility];  Service: Endoscopy;  Laterality: N/A;  . CORONARY ARTERY BYPASS GRAFT N/A 03/16/2019   Procedure: CORONARY ARTERY BYPASS GRAFTING (CABG), ON PUMP, TIMES THREE, USING LEFT INTERNAL MAMMARY ARTERY, RIGHT GREATER SAPHENOUS VEIN HARVESTED ENDOSCOPICALLY;  Surgeon: Ivin Poot, MD;  Location: [place];  Service: Open Heart Surgery;  Laterality: N/A;  swan only  . CYSTOSCOPY WITH FULGERATION N/A 04/30/2019   Procedure: CYSTOSCOPY WITH FULGERATION;  Surgeon: Irine Seal, MD;  Location: [place];  Service: Urology;   Laterality: N/A;  . FLEXIBLE SIGMOIDOSCOPY N/A 10/15/2019   Procedure: FLEXIBLE SIGMOIDOSCOPY with propofol;  Surgeon: Daneil Dolin, MD;  [facility];  Service: Endoscopy;  Laterality: N/A;  . FLEXIBLE SIGMOIDOSCOPY N/A 11/24/2019   Procedure: FLEXIBLE SIGMOIDOSCOPY;  Surgeon: Harvel Quale, MD;  [facility];  Service: Gastroenterology;  Laterality: N/A;  . HERNIA REPAIR    . IR FLUORO GUIDE CV LINE LEFT  04/23/2019  . IR FLUORO GUIDE CV LINE RIGHT  05/24/2019  . IR US GUIDE VASC ACCESS LEFT  04/23/2019  . MAZE N/A 03/16/2019   Procedure: MAZE;  Surgeon: Ivin Poot, MD;  Location: [place];  Service: Open Heart Surgery;  Laterality: N/A;  . PLACEMENT OF IMPELLA LEFT VENTRICULAR ASSIST DEVICE Right 03/27/2019   Procedure: Placement Of Impella Left Ventricular Assist Device using ABIOMED Impella 5.5 with SmartAssist Device.;  Surgeon: Wonda Olds, MD;  [facility];  Service: Thoracic;  Laterality: Right;  . POLYPECTOMY  11/24/2019   Procedure: POLYPECTOMY;  Surgeon: Harvel Quale, MD;  [facility];  Service: Gastroenterology;;  sigmoid  . PROSTATE SURGERY    . REMOVAL OF IMPELLA LEFT VENTRICULAR ASSIST DEVICE N/A 04/10/2019   Procedure: REMOVAL OF IMPELLA LEFT VENTRICULAR ASSIST DEVICE WITH INSERTION OF RIGHT FEMORAL ARTERIAL LINE;  Surgeon: Ivin Poot, MD;  Location: [place];  Service: Open Heart Surgery;  Laterality: N/A;  . REMOVAL OF PLEURAL DRAINAGE CATHETER Left 07/02/2019   Procedure: REMOVAL OF PLEURAL DRAINAGE CATHETER;  Surgeon: Ivin Poot, MD;  Location: [place];  Service: Thoracic;  Laterality: Left;  . RIGHT/LEFT HEART CATH AND CORONARY ANGIOGRAPHY N/A 03/15/2019   Procedure: RIGHT/LEFT HEART CATH AND CORONARY ANGIOGRAPHY;  Surgeon: Burnell Blanks, MD;  Location: [place] CV LAB;  Service: Cardiovascular;  Laterality: N/A;  . TEE WITHOUT CARDIOVERSION N/A 03/16/2019   Procedure: TRANSESOPHAGEAL  ECHOCARDIOGRAM (TEE);  Surgeon: Prescott Gum, Collier Salina, MD;  Location: [place];  Service: Open Heart Surgery;  Laterality: N/A;  . TRACHEOSTOMY TUBE PLACEMENT N/A 03/27/2019   Procedure: TRACHEOSTOMY placed using [place]iley 8DCT Cuffed.;  Surgeon: Wonda Olds, MD;  [facility];  Service: Thoracic;  Laterality: N/A;  . TRANSURETHRAL RESECTION OF BLADDER NECK N/A 05/13/2019   Procedure: CYSTOSCOPY CLOT EVACUATION FULGRATION CYSTOGRAM AND INSTILLATION OF FORMALIN;  Surgeon: Lucas Mallow, MD;  Location: [place];  Service: Urology;  Laterality: N/A;       Family History  Problem Relation Age of Onset  . CAD Mother 64  . CAD Father 46  .  CAD Sister   . CAD Brother     Social History   Tobacco Use  . Smoking status: Former Smoker    Packs/day: 0.25    Years: 50.00    Pack years: 12.50    Types: Cigarettes    Quit date: 02/15/1990    Years since quitting: 29.8  . Smokeless tobacco: Never Used  Vaping Use  . Vaping Use: Never used  Substance Use Topics  . Alcohol use: No  . Drug use: No  Lives in assisted care facility  Home Medications Prior to Admission medications   Medication Sig Start Date End Date Taking? Authorizing Provider  acetaminophen (TYLENOL) 500 MG tablet Take 500 mg by mouth every 6 (six) hours as needed for moderate pain.    [provider]  aspirin 81 MG chewable tablet Chew 81 mg by mouth in the morning.  05/21/19   [provider]  b complex-vitamin c-folic acid (NEPHRO-VITE) 0.8 MG TABS tablet Take 1 tablet by mouth daily.    [provider]  Calcium Carbonate-Vitamin D 600-400 MG-UNIT tablet Take 1 tablet by mouth daily. 10/08/19 12/07/19  [provider]  carboxymethylcellulose (REFRESH PLUS) 0.5 % SOLN Place 1 drop into both eyes 4 (four) times daily. 10/08/19   [provider]  docusate sodium (COLACE) 100 MG capsule Take 1 capsule (100 mg total) by mouth 2 (two) times daily. 10/17/19   Caren Griffins, MD  doxycycline  (VIBRAMYCIN) 100 MG capsule Take 1 capsule (100 mg total) by mouth 2 (two) times daily for 3 days. 12/05/19 12/08/19  Johnson, Clanford L, MD  Ensure Plus (ENSURE PLUS) LIQD Take 237 mLs by mouth 2 (two) times daily between meals.    [provider]  famotidine (PEPCID) 20 MG tablet Take 20 mg by mouth daily.     [provider]  iron sucrose (VENOFER) 20 MG/ML injection Iron Sucrose (Venofer) 11/17/19 11/15/20  [provider]  midodrine (PROAMATINE) 10 MG tablet Take 10 mg by mouth 2 (two) times daily.  10/08/19 10/07/20  [provider]  midodrine (PROAMATINE) 5 MG tablet Take Every Tuesday, Thursday, Saturday Patient taking differently: Take Every Monday, Wednesday, Friday 11/07/19   Ahmed Prima, Fransisco Hertz, PA-C  Nutritional Supplements (FEEDING SUPPLEMENT, NEPRO CARB STEADY,) LIQD Take 237 mLs by mouth at bedtime.    [provider]  nystatin ointment (MYCOSTATIN) Apply 1 application topically daily as needed for itching. Apply to Penis as needed if itching occurs. 06/22/19   [provider]  oxyCODONE-acetaminophen (PERCOCET/ROXICET) 5-325 MG tablet Take 1 tablet by mouth every 4 (four) hours as needed for severe pain.    [provider]  polyethylene glycol (MIRALAX / GLYCOLAX) 17 g packet Take 17 g by mouth 2 (two) times daily. 11/25/19   Orson Eva, MD  predniSONE (DELTASONE) 10 MG tablet Take 40 mg by mouth daily.  02/11/2020   [provider]  predniSONE (DELTASONE) 10 MG tablet Take 50 mg by mouth daily with breakfast. 12/26/19 02/09/2020  [provider]  predniSONE (DELTASONE) 10 MG tablet Take 60 mg by mouth daily with breakfast. 11/26/19 12/26/19  [provider]  senna (SENOKOT) 8.6 MG tablet Take 1 tablet by mouth as needed for constipation.  10/08/19 10/07/20  [provider]  sulfamethoxazole-trimethoprim (BACTRIM) 400-80 MG tablet Take 1 tablet by mouth 3 (three) times a week. Monday, Wednesday,  Friday 10/08/19 12/08/19  [provider]  triamcinolone cream (KENALOG) 0.1 % Apply 1 application topically 2 (two)  times daily. Apply to skin that is intact. DO NOT apply to "burn wounds". 10/08/19 10/07/20  [provider]    Allergies    Atorvastatin, Adhesive [tape], Amiodarone, Reglan [metoclopramide], and Heparin  Review of Systems   Review of Systems  All other systems reviewed and are negative.   Physical Exam Updated Vital Signs BP 118/66   Pulse 90   Temp 97.7 F (36.5 C) (Oral)   Resp 15   Ht 5\' 8"  (1.727 m)   Wt 64 kg   SpO2 100%   BMI 21.45 kg/m   Physical Exam Vitals and nursing note reviewed.  Constitutional:      General: He is in acute distress.     Appearance: Normal appearance.  HENT:     Head: Normocephalic and atraumatic.     Right Ear: External ear normal.     Left Ear: External ear normal.  Eyes:     Extraocular Movements: Extraocular movements intact.     Conjunctiva/sclera: Conjunctivae normal.     Pupils: Pupils are equal, round, and reactive to light.  Cardiovascular:     Rate and Rhythm: Tachycardia present. Rhythm irregular.     Pulses: Normal pulses.     Heart sounds: Normal heart sounds.  Pulmonary:     Effort: Respiratory distress present.     Breath sounds: Wheezing present.  Abdominal:     General: Bowel sounds are normal.     Palpations: Abdomen is soft.     Tenderness: There is no abdominal tenderness.  Musculoskeletal:        General: No swelling.     Cervical back: Normal range of motion.  Skin:    General: Skin is warm and dry.  Neurological:     Mental Status: He is alert.  Psychiatric:        Mood and Affect: Mood is anxious. Affect is labile.        Behavior: Behavior is agitated.     ED Results / Procedures / Treatments   Labs (all labs ordered are listed, but only abnormal results are displayed) Results for orders placed or performed during the hospital encounter of 12/06/19  Comprehensive  metabolic panel  Result Value Ref Range   Sodium 137 135 - 145 mmol/L   Potassium 4.8 3.5 - 5.1 mmol/L   Chloride 98 98 - 111 mmol/L   CO2 26 22 - 32 mmol/L   Glucose, Bld 111 (H) 70 - 99 mg/dL   BUN 51 (H) 8 - 23 mg/dL   Creatinine, Ser 3.81 (H) 0.61 - 1.24 mg/dL   Calcium 8.6 (L) 8.9 - 10.3 mg/dL   Total Protein 6.0 (L) 6.5 - 8.1 g/dL   Albumin 3.0 (L) 3.5 - 5.0 g/dL   AST 24 15 - 41 U/L   ALT 16 0 - 44 U/L   Alkaline Phosphatase 54 38 - 126 U/L   Total Bilirubin 0.8 0.3 - 1.2 mg/dL   GFR, Estimated 13 (L) >60 mL/min   Anion gap 13 5 - 15  Brain natriuretic peptide  Result Value Ref Range   B Natriuretic Peptide >4,500.0 (H) 0.0 - 100.0 pg/mL  CBC with Differential  Result Value Ref Range   WBC 7.8 4.0 - 10.5 K/uL   RBC 3.04 (L) 4.22 - 5.81 MIL/uL   Hemoglobin 9.4 (L) 13.0 - 17.0 g/dL   HCT 31.1 (L) 39 - 52 %   MCV 102.3 (H) 80.0 - 100.0 fL   MCH 30.9 26.0 - 34.0  pg   MCHC 30.2 30.0 - 36.0 g/dL   RDW 18.4 (H) 11.5 - 15.5 %   Platelets 216 150 - 400 K/uL   nRBC 0.0 0.0 - 0.2 %   Neutrophils Relative % 66 %   Neutro Abs 5.2 1.7 - 7.7 K/uL   Lymphocytes Relative 17 %   Lymphs Abs 1.3 0.7 - 4.0 K/uL   Monocytes Relative 13 %   Monocytes Absolute 1.0 0.1 - 1.0 K/uL   Eosinophils Relative 0 %   Eosinophils Absolute 0.0 0.0 - 0.5 K/uL   Basophils Relative 0 %   Basophils Absolute 0.0 0.0 - 0.1 K/uL   Immature Granulocytes 4 %   Abs Immature Granulocytes 0.30 (H) 0.00 - 0.07 K/uL  Lactic acid, plasma  Result Value Ref Range   Lactic Acid, Venous 2.5 (HH) 0.5 - 1.9 mmol/L  Troponin I (High Sensitivity)  Result Value Ref Range   Troponin I (High Sensitivity) 55 (H) <18 ng/L   Laboratory interpretation all normal except stable anemia, chronic renal disease with normal potassium, very much elevated BNP even over patient's baseline    EKG EKG Interpretation  Date/Time:  Thursday December 06 2019 04:28:31 EDT Ventricular Rate:  120 PR Interval:    QRS  Duration: 110 QT Interval:  300 QTC Calculation: 424 R Axis:   125 Text Interpretation: Undetermined rhythm Ventricular premature complex Anterior infarct, old Poor data quality Confirmed by Rolland Porter [phone]) on 12/06/2019 4:38:44 AM   #2 EKG  EKG Interpretation  Date/Time:  Thursday December 06 2019 04:57:32 EDT Ventricular Rate:  105 PR Interval:    QRS Duration: 106 QT Interval:  336 QTC Calculation: 444 R Axis:   125 Text Interpretation: Atrial fibrillation Anterior infarct, old Borderline repolarization abnormality Confirmed by Rolland Porter [phone]) on 12/06/2019 5:03:37 AM        Radiology DG Chest Port 1 View  Result Date: 12/06/2019 CLINICAL DATA:  Shortness of breath EXAM: PORTABLE CHEST 1 VIEW COMPARISON:  Four days ago FINDINGS: Cephalized blood flow, small pleural effusions, and Kerley lines. Stable heart size. There has been CABG and left atrial clipping. Dialysis catheter from the left tip at the upper right atrium. Artifact from EKG leads. IMPRESSION: CHF pattern. Electronically Signed   By: Monte Fantasia M.D.   On: 12/06/2019 05:24     DG Chest 1 View  Result Date: 11/19/2019 CLINICAL DATA:  LEFT pleural effusion post thoracentesis, coronary artery disease, end-stage renal disease  IMPRESSION: No pneumothorax following LEFT thoracentesis. Minimal residual LEFT pleural effusion and basilar atelectasis. Electronically Signed   By: Lavonia Dana M.D.   On: 11/19/2019 13:33   DG Chest 2 View  Result Date: 11/14/2019 CLINICAL DATA:  Shortness of breath. IMPRESSION: Mild left pleural effusion with associated left basilar atelectasis or infiltrate. Minimal right pleural effusion. Electronically Signed   By: Marijo Conception M.D.   On: 11/14/2019 11:34   DG Chest Port 1 View  Result Date: 12/02/2019 CLINICAL DATA:  Fever, cough.  IMPRESSION: Patchy bibasilar opacities, atelectasis versus infection. Trace left pleural effusion, unchanged. Electronically Signed   By:  Primitivo Gauze M.D.   On: 12/02/2019 12:02   US THORACENTESIS ASP PLEURAL SPACE W/IMG GUIDE  Result Date: 11/19/2019 INDICATION: LEFT pleural effusion . FINDINGS: A total of approximately 750 mL of yellow LEFT pleural fluid was removed. IMPRESSION: Successful ultrasound guided LEFT thoracentesis yielding 750 mL of pleural fluid. Electronically Signed   By: Lavonia Dana M.D.   On: 11/19/2019  13:31    Procedures Procedures (including critical care time)  Medications Ordered in ED Medications - No data to display  ED Course  I have reviewed the triage vital signs and the nursing notes.  Pertinent labs & imaging results that were available during my care of the patient were reviewed by me and considered in my medical decision making (see chart for details).    MDM Rules/Calculators/A&P                          Review of patient's chart shows he has a history of paroxysmal atrial fibrillation.  He was diagnosed of pneumonia on October 20.  He also has congestive heart failure, anemia, and end-stage renal disease.  He has pemphigus is vulgaris and is on prednisone.  5:55 AM Dr. Augustin Coupe, nephrology states outpatient dialysis is preferred if patient is stable.  I will talk to the patient and see if he feels like he can go to his dialysis center.  Dr. Augustin Coupe pointed out however he probably could not be done before 11 AM anyway even if he was admitted to the hospital.  6:35 AM patient has been sleeping and resting peacefully.  His blood pressure is 118/66 and heart rate is 90.  His pulse ox is 100% with his nasal cannula oxygen.  Patient is calm now.  He states he gets his dialysis at [facility].  We discussed getting dialysis today as an outpatient and he is agreeable.  He does want to be released back to his facility first however.  Final Clinical Impression(s) / ED Diagnoses Final diagnoses:  ESRD needing dialysis [facility])    Rx / DC Orders ED Discharge Orders    None     Plan  discharge  Rolland Porter, MD, Barbette Or, MD 12/06/19 [phone]

## 2019-12-06 NOTE — ED Notes (Signed)
Date and time results received: 12/06/19 0519  Test: lactic Critical Value: 2.5  Name of Provider Notified: Tomi Bamberger, MD  Orders Received? Or Actions Taken?: acknowleged

## 2019-12-07 LAB — CULTURE, BLOOD (ROUTINE X 2): Culture: NO GROWTH

## 2019-12-08 LAB — CULTURE, BLOOD (ROUTINE X 2)
Culture: NO GROWTH
Culture: NO GROWTH
Special Requests: ADEQUATE
Special Requests: ADEQUATE

## 2019-12-11 ENCOUNTER — Observation Stay (HOSPITAL_COMMUNITY)
Admission: EM | Admit: 2019-12-11 | Discharge: 2019-12-12 | Disposition: A | Discharge status: Home / Self Care | Payer: Medicare Other | Attending: Family Medicine | Admitting: Family Medicine

## 2019-12-11 ENCOUNTER — Encounter (HOSPITAL_COMMUNITY): Payer: Self-pay | Admitting: Emergency Medicine

## 2019-12-11 ENCOUNTER — Other Ambulatory Visit: Payer: Self-pay

## 2019-12-11 ENCOUNTER — Emergency Department (HOSPITAL_COMMUNITY): Payer: Medicare Other

## 2019-12-11 DIAGNOSIS — I5042 Chronic combined systolic (congestive) and diastolic (congestive) heart failure: Secondary | ICD-10-CM | POA: Diagnosis not present

## 2019-12-11 DIAGNOSIS — Z7982 Long term (current) use of aspirin: Secondary | ICD-10-CM | POA: Insufficient documentation

## 2019-12-11 DIAGNOSIS — Z87891 Personal history of nicotine dependence: Secondary | ICD-10-CM | POA: Insufficient documentation

## 2019-12-11 DIAGNOSIS — Z8546 Personal history of malignant neoplasm of prostate: Secondary | ICD-10-CM | POA: Diagnosis not present

## 2019-12-11 DIAGNOSIS — R5381 Other malaise: Secondary | ICD-10-CM | POA: Diagnosis present

## 2019-12-11 DIAGNOSIS — J189 Pneumonia, unspecified organism: Secondary | ICD-10-CM

## 2019-12-11 DIAGNOSIS — E039 Hypothyroidism, unspecified: Secondary | ICD-10-CM | POA: Insufficient documentation

## 2019-12-11 DIAGNOSIS — E1122 Type 2 diabetes mellitus with diabetic chronic kidney disease: Secondary | ICD-10-CM | POA: Insufficient documentation

## 2019-12-11 DIAGNOSIS — I132 Hypertensive heart and chronic kidney disease with heart failure and with stage 5 chronic kidney disease, or end stage renal disease: Secondary | ICD-10-CM | POA: Insufficient documentation

## 2019-12-11 DIAGNOSIS — Z20822 Contact with and (suspected) exposure to covid-19: Secondary | ICD-10-CM | POA: Insufficient documentation

## 2019-12-11 DIAGNOSIS — Z79899 Other long term (current) drug therapy: Secondary | ICD-10-CM | POA: Insufficient documentation

## 2019-12-11 DIAGNOSIS — I471 Supraventricular tachycardia: Secondary | ICD-10-CM | POA: Insufficient documentation

## 2019-12-11 DIAGNOSIS — I2510 Atherosclerotic heart disease of native coronary artery without angina pectoris: Secondary | ICD-10-CM | POA: Diagnosis not present

## 2019-12-11 DIAGNOSIS — I4891 Unspecified atrial fibrillation: Principal | ICD-10-CM | POA: Insufficient documentation

## 2019-12-11 DIAGNOSIS — L100 Pemphigus vulgaris: Secondary | ICD-10-CM | POA: Diagnosis present

## 2019-12-11 DIAGNOSIS — J181 Lobar pneumonia, unspecified organism: Secondary | ICD-10-CM | POA: Insufficient documentation

## 2019-12-11 DIAGNOSIS — N186 End stage renal disease: Secondary | ICD-10-CM | POA: Insufficient documentation

## 2019-12-11 DIAGNOSIS — Z66 Do not resuscitate: Secondary | ICD-10-CM | POA: Diagnosis present

## 2019-12-11 DIAGNOSIS — Z9861 Coronary angioplasty status: Secondary | ICD-10-CM | POA: Insufficient documentation

## 2019-12-11 DIAGNOSIS — R002 Palpitations: Secondary | ICD-10-CM | POA: Diagnosis present

## 2019-12-11 DIAGNOSIS — R000 Tachycardia, unspecified: Secondary | ICD-10-CM | POA: Diagnosis present

## 2019-12-11 DIAGNOSIS — N400 Enlarged prostate without lower urinary tract symptoms: Secondary | ICD-10-CM | POA: Diagnosis present

## 2019-12-11 LAB — CBC WITH DIFFERENTIAL/PLATELET
Abs Immature Granulocytes: 0.3 10*3/uL — ABNORMAL HIGH (ref 0.00–0.07)
Basophils Absolute: 0 10*3/uL (ref 0.0–0.1)
Basophils Relative: 0 %
Eosinophils Absolute: 0 10*3/uL (ref 0.0–0.5)
Eosinophils Relative: 0 %
HCT: 33.5 % — ABNORMAL LOW (ref 39.0–52.0)
Hemoglobin: 9.7 g/dL — ABNORMAL LOW (ref 13.0–17.0)
Immature Granulocytes: 3 %
Lymphocytes Relative: 9 %
Lymphs Abs: 0.8 10*3/uL (ref 0.7–4.0)
MCH: 30.5 pg (ref 26.0–34.0)
MCHC: 29 g/dL — ABNORMAL LOW (ref 30.0–36.0)
MCV: 105.3 fL — ABNORMAL HIGH (ref 80.0–100.0)
Monocytes Absolute: 0.6 10*3/uL (ref 0.1–1.0)
Monocytes Relative: 6 %
Neutro Abs: 7.4 10*3/uL (ref 1.7–7.7)
Neutrophils Relative %: 82 %
Platelets: 282 10*3/uL (ref 150–400)
RBC: 3.18 MIL/uL — ABNORMAL LOW (ref 4.22–5.81)
RDW: 19.7 % — ABNORMAL HIGH (ref 11.5–15.5)
WBC: 9.1 10*3/uL (ref 4.0–10.5)
nRBC: 2.3 % — ABNORMAL HIGH (ref 0.0–0.2)

## 2019-12-11 LAB — CBC
HCT: 31.2 % — ABNORMAL LOW (ref 39.0–52.0)
Hemoglobin: 8.9 g/dL — ABNORMAL LOW (ref 13.0–17.0)
MCH: 30.9 pg (ref 26.0–34.0)
MCHC: 28.5 g/dL — ABNORMAL LOW (ref 30.0–36.0)
MCV: 108.3 fL — ABNORMAL HIGH (ref 80.0–100.0)
Platelets: 256 10*3/uL (ref 150–400)
RBC: 2.88 MIL/uL — ABNORMAL LOW (ref 4.22–5.81)
RDW: 19.7 % — ABNORMAL HIGH (ref 11.5–15.5)
WBC: 9.6 10*3/uL (ref 4.0–10.5)
nRBC: 1.7 % — ABNORMAL HIGH (ref 0.0–0.2)

## 2019-12-11 LAB — COMPREHENSIVE METABOLIC PANEL
ALT: 18 U/L (ref 0–44)
AST: 41 U/L (ref 15–41)
Albumin: 2.7 g/dL — ABNORMAL LOW (ref 3.5–5.0)
Alkaline Phosphatase: 59 U/L (ref 38–126)
Anion gap: 10 (ref 5–15)
BUN: 37 mg/dL — ABNORMAL HIGH (ref 8–23)
CO2: 28 mmol/L (ref 22–32)
Calcium: 8.1 mg/dL — ABNORMAL LOW (ref 8.9–10.3)
Chloride: 97 mmol/L — ABNORMAL LOW (ref 98–111)
Creatinine, Ser: 4.28 mg/dL — ABNORMAL HIGH (ref 0.61–1.24)
GFR, Estimated: 13 mL/min — ABNORMAL LOW (ref >60)
Glucose, Bld: 134 mg/dL — ABNORMAL HIGH (ref 70–99)
Potassium: 4.6 mmol/L (ref 3.5–5.1)
Sodium: 135 mmol/L (ref 135–145)
Total Bilirubin: 1.4 mg/dL — ABNORMAL HIGH (ref 0.3–1.2)
Total Protein: 5.5 g/dL — ABNORMAL LOW (ref 6.5–8.1)

## 2019-12-11 LAB — RENAL FUNCTION PANEL
Albumin: 2.4 g/dL — ABNORMAL LOW (ref 3.5–5.0)
Anion gap: 12 (ref 5–15)
BUN: 34 mg/dL — ABNORMAL HIGH (ref 8–23)
CO2: 25 mmol/L (ref 22–32)
Calcium: 7.4 mg/dL — ABNORMAL LOW (ref 8.9–10.3)
Chloride: 99 mmol/L (ref 98–111)
Creatinine, Ser: 3.82 mg/dL — ABNORMAL HIGH (ref 0.61–1.24)
GFR, Estimated: 14 mL/min — ABNORMAL LOW (ref >60)
Glucose, Bld: 107 mg/dL — ABNORMAL HIGH (ref 70–99)
Phosphorus: 3.3 mg/dL (ref 2.5–4.6)
Potassium: 4.3 mmol/L (ref 3.5–5.1)
Sodium: 136 mmol/L (ref 135–145)

## 2019-12-11 LAB — CULTURE, BLOOD (ROUTINE X 2)
Culture: NO GROWTH
Culture: NO GROWTH

## 2019-12-11 LAB — RESPIRATORY PANEL BY RT PCR (FLU A&B, COVID)
Influenza A by PCR: NEGATIVE
Influenza B by PCR: NEGATIVE
SARS Coronavirus 2 by RT PCR: NEGATIVE

## 2019-12-11 LAB — MAGNESIUM: Magnesium: 2.1 mg/dL (ref 1.7–2.4)

## 2019-12-11 MED ORDER — SODIUM CHLORIDE 0.9% FLUSH
3.0000 mL | INTRAVENOUS | Status: DC | PRN
  Administered 2019-12-11: 3 mL via INTRAVENOUS

## 2019-12-11 MED ORDER — SODIUM CHLORIDE 0.9% FLUSH
3.0000 mL | Freq: Two times a day (BID) | INTRAVENOUS | Status: DC
  Administered 2019-12-11 – 2019-12-12 (×2): 3 mL via INTRAVENOUS

## 2019-12-11 MED ORDER — SODIUM CHLORIDE 0.9 % IV SOLN
2.0000 g | Freq: Once | INTRAVENOUS | Status: AC
  Administered 2019-12-11: 2 g via INTRAVENOUS
  Filled 2019-12-11: qty 2

## 2019-12-11 MED ORDER — TRAZODONE HCL 50 MG PO TABS: 50.0000 mg | ORAL_TABLET | Freq: Every evening | ORAL | Status: DC | PRN

## 2019-12-11 MED ORDER — ONDANSETRON HCL 4 MG/2ML IJ SOLN: 4.0000 mg | Freq: Four times a day (QID) | INTRAMUSCULAR | Status: DC | PRN

## 2019-12-11 MED ORDER — SENNA 8.6 MG PO TABS
1.0000 | ORAL_TABLET | Freq: Every day | ORAL | Status: DC
  Filled 2019-12-11: qty 1

## 2019-12-11 MED ORDER — SODIUM CHLORIDE 0.9 % IV SOLN: 100.0000 mL | INTRAVENOUS | Status: DC | PRN

## 2019-12-11 MED ORDER — POLYETHYLENE GLYCOL 3350 17 G PO PACK
17.0000 g | PACK | Freq: Two times a day (BID) | ORAL | Status: DC
  Administered 2019-12-12: 17 g via ORAL
  Filled 2019-12-11: qty 1

## 2019-12-11 MED ORDER — POLYVINYL ALCOHOL 1.4 % OP SOLN
1.0000 [drp] | Freq: Four times a day (QID) | OPHTHALMIC | Status: DC
  Administered 2019-12-11 – 2019-12-12 (×4): 1 [drp] via OPHTHALMIC
  Filled 2019-12-11 (×2): qty 15

## 2019-12-11 MED ORDER — LIDOCAINE-PRILOCAINE 2.5-2.5 % EX CREA: 1.0000 "application " | TOPICAL_CREAM | CUTANEOUS | Status: DC | PRN

## 2019-12-11 MED ORDER — POLYETHYLENE GLYCOL 3350 17 G PO PACK: 17.0000 g | PACK | Freq: Every day | ORAL | Status: DC | PRN

## 2019-12-11 MED ORDER — LIDOCAINE HCL (PF) 1 % IJ SOLN: 5.0000 mL | INTRAMUSCULAR | Status: DC | PRN

## 2019-12-11 MED ORDER — AMIODARONE HCL 200 MG PO TABS: 200.0000 mg | ORAL_TABLET | Freq: Every day | ORAL | Status: DC

## 2019-12-11 MED ORDER — AMIODARONE HCL IN DEXTROSE 360-4.14 MG/200ML-% IV SOLN
30.0000 mg/h | INTRAVENOUS | Status: DC
  Filled 2019-12-11 (×2): qty 200

## 2019-12-11 MED ORDER — DOCUSATE SODIUM 100 MG PO CAPS: 100.0000 mg | ORAL_CAPSULE | Freq: Two times a day (BID) | ORAL | Status: DC

## 2019-12-11 MED ORDER — PENTAFLUOROPROP-TETRAFLUOROETH EX AERO: 1.0000 "application " | INHALATION_SPRAY | CUTANEOUS | Status: DC | PRN

## 2019-12-11 MED ORDER — SODIUM CHLORIDE 0.9 % IV SOLN: 250.0000 mL | INTRAVENOUS | Status: DC | PRN

## 2019-12-11 MED ORDER — CHLORHEXIDINE GLUCONATE CLOTH 2 % EX PADS
6.0000 | MEDICATED_PAD | Freq: Every day | CUTANEOUS | Status: DC
  Administered 2019-12-12: 6 via TOPICAL

## 2019-12-11 MED ORDER — ACETAMINOPHEN 325 MG PO TABS: 650.0000 mg | ORAL_TABLET | Freq: Four times a day (QID) | ORAL | Status: DC | PRN

## 2019-12-11 MED ORDER — ASPIRIN 81 MG PO CHEW
81.0000 mg | CHEWABLE_TABLET | Freq: Every morning | ORAL | Status: DC
  Administered 2019-12-11 – 2019-12-12 (×2): 81 mg via ORAL
  Filled 2019-12-11 (×2): qty 1

## 2019-12-11 MED ORDER — ONDANSETRON HCL 4 MG PO TABS: 4.0000 mg | ORAL_TABLET | Freq: Four times a day (QID) | ORAL | Status: DC | PRN

## 2019-12-11 MED ORDER — SODIUM CHLORIDE 0.9 % IV SOLN
1.0000 g | Freq: Once | INTRAVENOUS | Status: DC
  Filled 2019-12-11: qty 1

## 2019-12-11 MED ORDER — DILTIAZEM LOAD VIA INFUSION
10.0000 mg | Freq: Once | INTRAVENOUS | Status: AC
  Administered 2019-12-11: 10 mg via INTRAVENOUS
  Filled 2019-12-11: qty 10

## 2019-12-11 MED ORDER — SENNOSIDES-DOCUSATE SODIUM 8.6-50 MG PO TABS
2.0000 | ORAL_TABLET | Freq: Every day | ORAL | Status: DC
  Filled 2019-12-11: qty 2

## 2019-12-11 MED ORDER — BISACODYL 10 MG RE SUPP: 10.0000 mg | Freq: Every day | RECTAL | Status: DC | PRN

## 2019-12-11 MED ORDER — NEPRO/CARBSTEADY PO LIQD
237.0000 mL | Freq: Every day | ORAL | Status: DC
  Filled 2019-12-11: qty 237

## 2019-12-11 MED ORDER — ACETAMINOPHEN 650 MG RE SUPP: 650.0000 mg | Freq: Four times a day (QID) | RECTAL | Status: DC | PRN

## 2019-12-11 MED ORDER — AMIODARONE LOAD VIA INFUSION
150.0000 mg | Freq: Once | INTRAVENOUS | Status: AC
  Administered 2019-12-11: 150 mg via INTRAVENOUS
  Filled 2019-12-11: qty 83.34

## 2019-12-11 MED ORDER — OXYCODONE-ACETAMINOPHEN 5-325 MG PO TABS: 1.0000 | ORAL_TABLET | ORAL | Status: DC | PRN

## 2019-12-11 MED ORDER — MIDODRINE HCL 5 MG PO TABS
10.0000 mg | ORAL_TABLET | Freq: Two times a day (BID) | ORAL | Status: DC
  Administered 2019-12-11 – 2019-12-12 (×2): 10 mg via ORAL
  Filled 2019-12-11 (×3): qty 2

## 2019-12-11 MED ORDER — ENSURE ENLIVE PO LIQD
237.0000 mL | Freq: Two times a day (BID) | ORAL | Status: DC
  Administered 2019-12-12: 237 mL via ORAL
  Filled 2019-12-11: qty 237

## 2019-12-11 MED ORDER — BISACODYL 10 MG RE SUPP: 10.0000 mg | Freq: Every day | RECTAL | Status: DC

## 2019-12-11 MED ORDER — CALCIUM-VITAMIN D 500-200 MG-UNIT PO TABS
1.0000 | ORAL_TABLET | Freq: Every day | ORAL | Status: DC
  Filled 2019-12-11 (×3): qty 1

## 2019-12-11 MED ORDER — VANCOMYCIN HCL 1500 MG/300ML IV SOLN
1500.0000 mg | Freq: Once | INTRAVENOUS | Status: AC
  Administered 2019-12-11: 1500 mg via INTRAVENOUS
  Filled 2019-12-11: qty 300

## 2019-12-11 MED ORDER — PREDNISONE 20 MG PO TABS
60.0000 mg | ORAL_TABLET | Freq: Every day | ORAL | Status: DC
  Administered 2019-12-11 – 2019-12-12 (×2): 60 mg via ORAL
  Filled 2019-12-11: qty 3
  Filled 2019-12-11: qty 1

## 2019-12-11 MED ORDER — AMIODARONE HCL IN DEXTROSE 360-4.14 MG/200ML-% IV SOLN
60.0000 mg/h | INTRAVENOUS | Status: DC
  Administered 2019-12-11: 60 mg/h via INTRAVENOUS
  Filled 2019-12-11: qty 200

## 2019-12-11 MED ORDER — FAMOTIDINE 20 MG PO TABS: 20.0000 mg | ORAL_TABLET | Freq: Every day | ORAL | Status: DC

## 2019-12-11 MED ORDER — DILTIAZEM HCL-DEXTROSE 125-5 MG/125ML-% IV SOLN (PREMIX)
5.0000 mg/h | INTRAVENOUS | Status: DC
  Administered 2019-12-11: 5 mg/h via INTRAVENOUS
  Filled 2019-12-11: qty 125

## 2019-12-11 MED ORDER — ACETAMINOPHEN 500 MG PO TABS: 500.0000 mg | ORAL_TABLET | Freq: Four times a day (QID) | ORAL | Status: DC | PRN

## 2019-12-11 MED ORDER — ALTEPLASE 2 MG IJ SOLR
2.0000 mg | Freq: Once | INTRAMUSCULAR | Status: DC | PRN
  Filled 2019-12-11: qty 2

## 2019-12-11 NOTE — ED Notes (Signed)
Pt has sacral decub with dressing.  Pt has heel sores dressed.  Pt placed on left right side and heel pads placed under heals to have them elevated off the bed.

## 2019-12-11 NOTE — ED Notes (Signed)
Contact son if issues and with updated Loreli Slot [phone]

## 2019-12-11 NOTE — ED Notes (Signed)
Daughter,(Debbie) [phone], Son,(Donald) [phone],

## 2019-12-11 NOTE — Progress Notes (Addendum)
Pharmacy Antibiotic Note  Jesse Murphy is a 86 y.o. male admitted on 12/11/2019 with pneumonia.  Pharmacy has been consulted for Vancomycin dosing.  Plan: Vancomycin 1500 mg IV x 1 dose. Vancomycin 750 mg IV after HD T-Th-Sat. Cefepime 2000 mg IV x 1 dose. F/U continuation Monitor labs, c/s, and vanco level as indicated.  Will follow-up nephrology plans for HD to determine when to schedule next doses of antibiotics  Height: 5\' 8"  (172.7 cm) Weight: 64 kg (141 lb 1.5 oz) IBW/kg (Calculated) : 68.4  Temp (24hrs), Avg:98.6 F (37 C), Min:98.6 F (37 C), Max:98.6 F (37 C)  Recent Labs  Lab 12/06/19 0454 12/11/19 0920  WBC 7.8 9.1  CREATININE 3.81* 4.28*  LATICACIDVEN 2.5*  --     Estimated Creatinine Clearance: 10.8 mL/min (A) (by C-G formula based on SCr of 4.28 mg/dL (H)).    Allergies  Allergen Reactions  . Atorvastatin Anaphylaxis and Rash    Leg pain  . Adhesive [Tape]     Tears skin  . Amiodarone     Rash (see 05/2019 hospital notes), but was on other medications at the time so unclear which agent   . Reglan [Metoclopramide]     Rash (see 05/2019 hospital notes), but was on other medications at the time so unclear which agent  . Heparin Rash    Thrombocytopenia/ HIT    Antimicrobials this admission: Vanco 10/26 >>  Cefepime 10/26  10/26 MRSA PCR: pending   Thank you for allowing pharmacy to be a part of this patient's care.  Margot Ables, PharmD Clinical Pharmacist 12/11/2019 12:26 PM

## 2019-12-11 NOTE — H&P (Signed)
Patient Demographics:    Jesse Murphy, is a 86 y.o. 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 6213   Systolic and diastolic CHF, chronic (HCC)   BPH (benign prostatic hyperplasia)   Debility   DNR (do not resuscitate)   Tachycardia  Brief summary:- 86 year old Caucasian man with past medical history relevant for   ESRD with HD on TTS schedule, CAD with prior three-vessel CABG in January 2001, and prior stent placement, with ischemic cardiomyopathy and combined diastolic and systolic dysfunction CHF, chronic hypotension on midodrine,, history of prostate CA with prior seeds implant 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 Carlyle Dolly -He is impression and recommendations are highlighted below -As per cardiologist Dr. Harl Bowie -patient's medical records/Notes mention prior bradycardia on beta blocker in the past. After extensive search through his very complex records he had an event 05/11/19 while  an inpatient of profound bradycardia to teens during a bladder irrigation requiring temp pacemaker placement thought to be vagally mediated. Has not been on av nodal agents since. Also history of hypotension on midodrine.   Some question of prior rash on amio vs statin in the past, it was unclear which was the issue. 01/31/2018 notes mention amio stopped due to dyspnea, no formal diagnosis of amio toxicity made at the time as not PFTs completed.  Given limitations on management for his MAT, would retry amiodarone and continue to avoid av nodal agents. In setting of MAT no anticoag is indicated. Of note history of PAF in the past, not on anticoag due to history of GI bleeding D/c diltiazem, start amio gtt.  -The above highlighted information was obtained from cardiologist Dr. Carlyle Dolly  -Patient will be admitted to stepdown unit on IV amiodarone drip --Pt will Not tolerate beta-blockers or other AV nodal blocking agent due to propensity towards severe, profound bradycardia and hypotension -  2)H/o CAD----CAD with prior three-vessel CABG in January 2001, and prior stent placement, with ischemic cardiomyopathy -No ACS type symptoms at this time -Unable to use beta-blockers due to prior history of significant profound bradycardia  3)ESRD--- TTS schedule usually, nephrology consult for ongoing hemodialysis while here -Continue midodrine especially with hemodialysis to avoid hypotension  4)Pemphigus Vulgaris--previously diagnosed by biopsy at [facility] this August -completed from 10/08/19 thru 12/08/19 --Continue very high-dose prednisone with very slow taper over months and not weeks currently on 60 mg daily of prednisone -Patient has an appointment coming up with [facility] on 12/14/2019  5) chest x-ray finding of possible pneumonia--patient received Vanco and cefepime in the ED -Hold off on further antibiotics for now as patient is clinically asymptomatic from a  pneumonia standpoint -Repeat chest x-ray in a.m. after hemodialysis -Recently completed antibiotics for presumed HCAP  6)Social/Ethics--- patient was previously a DNR/DNI, patient had universal DNR papers from SNF facility -RN Ala Bent at bedside, patient tells me and RN that he wants to be a full code -CODE STATUS changed per patient request  --universal DNR removed from records per patient's request  7) chronic constipation--with recent stercoral ulcer--- continue aggressive bowel regimen  8) NSTEMI cardiomyopathy with combined diastolic and systolic dysfunction CHF--Echo from July 2021 with EF of 55 to 60%- -will not tolerate beta-blockers due to propensity to wear severe, profound bradycardia and hypotension  9) history of prostate cancer status post prior seed implants with resulting radiation proctitis  10)Bil heel and sacral pressure ulcers--- c/n local wound care  Disposition/Need for in-Hospital Stay- patient unable to be discharged at this time due to --- persistent atrial tachycardia requiring IV amiodarone drip  Dispo: The patient is from: Home              Anticipated d/c is to: SNF              Anticipated d/c date is: 1 day              Patient currently is not medically stable to d/c. Barriers: Not Clinically Stable-  persistent atrial tachycardia requiring IV amiodarone drip  With History of - Reviewed by me  Past Medical History:  Diagnosis Date  . Anemia   . CAD (coronary artery disease)    a. s/p PCI in 1992 b. Coronary CT in 01/2018 showing extensive coronary calcification; FFR indeterminate --> medical management pursued at that time as patient not interested in repeat cath. c. s/p CABGx3 in 02/2019.  . Cancer [facility])    prostate  . Cardiogenic shock ([place])   . CHB (complete heart block) (HCC)    a. due to vagal event in 2021, requiring temp pacer.  . Chronic combined systolic and diastolic CHF (congestive heart failure) ([place])   . Debilitated   .  Decubitus ulcer of coccyx, unspecified pressure ulcer stage   . Dyspnea   . End stage renal disease ([place])   . Esophagitis   . H/O tracheostomy    a. during admit in 2021 for respiratory failure.  . Hematuria   . Hypertension   . Hypoalbuminemia   . Ischemic cardiomyopathy   . Myocardial infarction ([place])   . PAF (paroxysmal atrial fibrillation) ([place])   . 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 Pleural effusion, left    a. s/p pleurX catheter  . Pressure ulcer of both heels   . Pseudomonas pneumonia ([place])   . Thrombocytopenia ([place])   . Walker as ambulation aid    also uses wheelchair      Past Surgical History:  Procedure Laterality Date  . BASCILIC VEIN TRANSPOSITION Right 09/10/2019   Procedure: RIGHT ARM 1ST STAGE BASCILIC VEIN TRANSPOSITION;  Surgeon: Angelia Mould, MD;  Location: [place];  Service: Vascular;  Laterality: Right;  . BASCILIC VEIN TRANSPOSITION Right 11/06/2019   Procedure: RIGHT UPPER EXTREMITY SECOND  STAGE BASCILIC VEIN TRANSPOSITION;  Surgeon: Angelia Mould, MD;  Location: [place];  Service: Vascular;  Laterality: Right;  . BIOPSY  10/15/2019   Procedure: BIOPSY;  Surgeon: Daneil Dolin, MD;  [facility];  Service: Endoscopy;;  Distal Rectal Ulceration biopsies   . CHEST TUBE INSERTION Left 04/23/2019   Procedure: INSERTION PLEURAL DRAINAGE CATHETER TO DRAIN LEFT PLEURAL EFFUSION;  Surgeon: Ivin Poot, MD;  Location: [place];  Service: Thoracic;  Laterality: Left;  . CLIPPING OF ATRIAL APPENDAGE N/A 03/16/2019   Procedure: CLIPPING OF LEFT  ATRIAL APPENDAGE - USING ATRICLIP SIZE 40;  Surgeon: Ivin Poot, MD;  Location: [place];  Service: Open Heart Surgery;  Laterality: N/A;  . COLONOSCOPY N/A 08/25/2017   Procedure: COLONOSCOPY;  Surgeon: Rogene Houston, MD;  [facility];  Service: Endoscopy;  Laterality: N/A;  . CORONARY ARTERY BYPASS GRAFT N/A 03/16/2019   Procedure: CORONARY ARTERY BYPASS GRAFTING (CABG), ON PUMP, TIMES  THREE, USING LEFT INTERNAL MAMMARY ARTERY, RIGHT GREATER SAPHENOUS VEIN HARVESTED ENDOSCOPICALLY;  Surgeon: Ivin Poot, MD;  Location: [place];  Service: Open Heart Surgery;  Laterality: N/A;  swan only  . CYSTOSCOPY WITH FULGERATION N/A 04/30/2019   Procedure: CYSTOSCOPY WITH FULGERATION;  Surgeon: Irine Seal, MD;  Location: [place];  Service: Urology;  Laterality: N/A;  . FLEXIBLE SIGMOIDOSCOPY N/A 10/15/2019   Procedure: FLEXIBLE SIGMOIDOSCOPY with propofol;  Surgeon: Daneil Dolin, MD;  [facility];  Service: Endoscopy;  Laterality: N/A;  . FLEXIBLE SIGMOIDOSCOPY N/A 11/24/2019   Procedure: FLEXIBLE SIGMOIDOSCOPY;  Surgeon: Harvel Quale, MD;  [facility];  Service: Gastroenterology;  Laterality: N/A;  . HERNIA REPAIR    . IR FLUORO GUIDE CV LINE LEFT  04/23/2019  . IR FLUORO GUIDE CV LINE RIGHT  05/24/2019  . IR US GUIDE VASC ACCESS LEFT  04/23/2019  . MAZE N/A 03/16/2019   Procedure: MAZE;  Surgeon: Ivin Poot, MD;  Location: [place];  Service: Open Heart Surgery;  Laterality: N/A;  . PLACEMENT OF IMPELLA LEFT VENTRICULAR ASSIST DEVICE Right 03/27/2019   Procedure: Placement Of Impella Left Ventricular Assist Device using ABIOMED Impella 5.5 with SmartAssist Device.;  Surgeon: Wonda Olds, MD;  [facility];  Service: Thoracic;  Laterality: Right;  . POLYPECTOMY  11/24/2019   Procedure: POLYPECTOMY;  Surgeon: Harvel Quale, MD;  [facility];  Service: Gastroenterology;;  sigmoid  . PROSTATE SURGERY    . REMOVAL OF IMPELLA LEFT VENTRICULAR ASSIST DEVICE N/A 04/10/2019   Procedure: REMOVAL OF IMPELLA LEFT VENTRICULAR ASSIST DEVICE WITH INSERTION OF RIGHT FEMORAL ARTERIAL LINE;  Surgeon: Ivin Poot, MD;  Location: [place];  Service: Open Heart Surgery;  Laterality: N/A;  . REMOVAL OF PLEURAL DRAINAGE CATHETER Left 07/02/2019   Procedure: REMOVAL OF PLEURAL DRAINAGE CATHETER;  Surgeon: Ivin Poot, MD;  Location: [place];   Service: Thoracic;  Laterality: Left;  . RIGHT/LEFT HEART CATH AND CORONARY ANGIOGRAPHY N/A 03/15/2019   Procedure: RIGHT/LEFT HEART CATH AND CORONARY ANGIOGRAPHY;  Surgeon: Burnell Blanks, MD;  Location: [place] CV [facility];  Service: Cardiovascular;  Laterality: N/A;  . TEE WITHOUT CARDIOVERSION N/A 03/16/2019   Procedure: TRANSESOPHAGEAL ECHOCARDIOGRAM (TEE);  Surgeon: Prescott Gum, Collier Salina, MD;  Location: [place];  Service: Open Heart Surgery;  Laterality: N/A;  . TRACHEOSTOMY TUBE PLACEMENT N/A 03/27/2019   Procedure: TRACHEOSTOMY placed using [place]iley 8DCT Cuffed.;  Surgeon: Wonda Olds, MD;  Location: [facility]  OR;  Service: Thoracic;  Laterality: N/A;  . TRANSURETHRAL RESECTION OF BLADDER NECK N/A 05/13/2019   Procedure: CYSTOSCOPY CLOT EVACUATION FULGRATION CYSTOGRAM AND INSTILLATION OF FORMALIN;  Surgeon: Lucas Mallow, MD;  Location: [place];  Service: Urology;  Laterality: N/A;     Chief Complaint  Patient presents with  . Tachycardia      HPI:    Kaian Fahs  is a 86 y.o. 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 Harl Bowie -patient's medical records/Notes mention prior bradycardia on beta blocker in the past. After extensive search through his very complex records he had an event 05/11/19 while an inpatient of profound bradycardia to  teens during a bladder irrigation requiring temp pacemaker placement thought to be vagally mediated. Has not been on av nodal agents since. Also history of hypotension on midodrine.   Some question of prior rash on amio vs statin in the past, it was unclear which was the issue. 01/31/2018 notes mention amio stopped due to dyspnea, no formal diagnosis of amio toxicity made at the time as not PFTs completed.  Given limitations on management for his MAT, would retry amiodarone and continue to avoid av nodal agents. In setting of MAT no anticoag is indicated. Of note history of PAF in the past, not on anticoag due to history of GI bleeding D/c diltiazem, start amio gtt.  -The above highlighted information was obtained from cardiologist Dr. Carlyle Dolly   Review of systems:    In addition to the HPI above,   A full Review of  Systems was done, all other systems reviewed are negative except as noted above in HPI , .    Social History:  Reviewed by me    Social History   Tobacco Use  . Smoking status: Former Smoker    Packs/day: 0.25    Years: 50.00    Pack years: 12.50    Types: Cigarettes    Quit date: 02/15/1990    Years since quitting: 29.8  . Smokeless tobacco: Never Used  Substance Use Topics  . Alcohol use: No     Family History :  Reviewed by me    Family History  Problem Relation Age of Onset  . CAD Mother 61  . CAD Father 21  . CAD Sister   . CAD Brother      Home Medications:   Prior to Admission medications   Medication Sig Start Date End Date Taking? Authorizing Provider  acetaminophen (TYLENOL) 500 MG tablet Take 500 mg by mouth every 6 (six) hours as needed for moderate pain.   Yes [provider]  aspirin 81 MG chewable tablet Chew 81 mg by mouth in the morning.  05/21/19  Yes [provider]  Calcium Carb-Cholecalciferol (CALCIUM CARBONATE-VITAMIN D3) 600-400 MG-UNIT TABS Take 1 tablet by mouth daily.   Yes [provider]    carboxymethylcellulose (REFRESH PLUS) 0.5 % SOLN Place 1 drop into both eyes 4 (  four) times daily. 10/08/19  Yes [provider]  docusate sodium (COLACE) 100 MG capsule Take 1 capsule (100 mg total) by mouth 2 (two) times daily. 10/17/19  Yes Gherghe, Vella Redhead, MD  Ensure Plus (ENSURE PLUS) LIQD Take 237 mLs by mouth 2 (two) times daily between meals.   Yes [provider]  midodrine (PROAMATINE) 10 MG tablet Take 10 mg by mouth 2 (two) times daily.  10/08/19 10/07/20 Yes [provider]  Nutritional Supplements (FEEDING SUPPLEMENT, NEPRO CARB STEADY,) LIQD Take 237 mLs by mouth at bedtime.   Yes [provider]  nystatin ointment (MYCOSTATIN) Apply 1 application topically daily as needed (Apply to Penis as needed if itching occurs.).  06/22/19  Yes [provider]  oxyCODONE-acetaminophen (PERCOCET/ROXICET) 5-325 MG tablet Take 1 tablet by mouth every 4 (four) hours as needed for severe pain.   Yes [provider]  polyethylene glycol (MIRALAX / GLYCOLAX) 17 g packet Take 17 g by mouth 2 (two) times daily. 11/25/19  Yes Tat, Shanon Brow, MD  predniSONE (DELTASONE) 10 MG tablet Take 60 mg by mouth daily with breakfast. 11/26/19 12/26/19 Yes [provider]  senna (SENOKOT) 8.6 MG tablet Take 1 tablet by mouth as needed for constipation.  10/08/19 10/07/20 Yes [provider]  sulfamethoxazole-trimethoprim (BACTRIM) 400-80 MG tablet Take 1 tablet by mouth 3 (three) times a week. Mon, wed, and Fri for bacterial infection.   Yes [provider]  triamcinolone cream (KENALOG) 0.1 % Apply 1 application topically 2 (two) times daily. Apply to skin that is intact. DO NOT apply to "burn wounds". 10/08/19 10/07/20 Yes [provider]  famotidine (PEPCID) 20 MG tablet Take 20 mg by mouth daily.  Patient not taking: Reported on 12/11/2019    [provider]  iron sucrose (VENOFER) 20 MG/ML injection Iron Sucrose  (Venofer) Patient not taking: Reported on 12/11/2019 11/17/19 11/15/20  [provider]  midodrine (PROAMATINE) 5 MG tablet Take Every Tuesday, Thursday, Saturday Patient not taking: Reported on 12/11/2019 11/07/19   Ahmed Prima, Fransisco Hertz, PA-C  predniSONE (DELTASONE) 10 MG tablet Take 40 mg by mouth daily.  02/13/2020   [provider]  predniSONE (DELTASONE) 10 MG tablet Take 50 mg by mouth daily with breakfast. 12/26/19 02/15/2020  [provider]     Allergies:     Allergies  Allergen Reactions  . Atorvastatin Anaphylaxis and Rash    Leg pain  . Adhesive [Tape]     Tears skin  . Amiodarone     Rash (see 05/2019 hospital notes), but was on other medications at the time so unclear which agent   . Reglan [Metoclopramide]     Rash (see 05/2019 hospital notes), but was on other medications at the time so unclear which agent  . Heparin Rash    Thrombocytopenia/ HIT     Physical Exam:   Vitals  Blood pressure 117/60, pulse 80, temperature 98.6 F (37 C), temperature source Oral, resp. rate (!) 21, height 5\' 8"  (1.727 m), weight 64 kg, SpO2 99 %.  Physical Examination: General appearance - alert, chronically ill appearing, and in no distress  Mental status - alert, oriented to person, place, and time,  Eyes - sclera anicteric Neck - supple, no JVD elevation , left IJ hemodialysis catheter Chest -diminished breath sounds , no wheezing Heart - S1 and S2 normal, irregular, tachycardic earlier Abdomen - soft, nontender, nondistended, no masses or organomegaly Neurological - screening mental status exam normal, neck supple without rigidity,  cranial nerves II through XII intact, DTR's normal and symmetric -Generalized weakness without new focal deficits Extremities - no pedal edema noted, intact peripheral pulses  Skin - warm, dry, bilateral heels and sacral decubitus     Data Review:    CBC Recent Labs  Lab 12/06/19 0454 12/11/19 0920 12/11/19 1219  WBC  7.8 9.1 9.6  HGB 9.4* 9.7* 8.9*  HCT 31.1* 33.5* 31.2*  PLT 216 282 256  MCV 102.3* 105.3* 108.3*  MCH 30.9 30.5 30.9  MCHC 30.2 29.0* 28.5*  RDW 18.4* 19.7* 19.7*  LYMPHSABS 1.3 0.8  --   MONOABS 1.0 0.6  --   EOSABS 0.0 0.0  --   BASOSABS 0.0 0.0  --    ------------------------------------------------------------------------------------------------------------------  Chemistries  Recent Labs  Lab 12/06/19 0454 12/11/19 0920 12/11/19 1219  NA 137 135 136  K 4.8 4.6 4.3  CL 98 97* 99  CO2 26 28 25   GLUCOSE 111* 134* 107*  BUN 51* 37* 34*  CREATININE 3.81* 4.28* 3.82*  CALCIUM 8.6* 8.1* 7.4*  MG  --  2.1  --   AST 24 41  --   ALT 16 18  --   ALKPHOS 54 59  --   BILITOT 0.8 1.4*  --    ------------------------------------------------------------------------------------------------------------------ estimated creatinine clearance is 12.1 mL/min (A) (by C-G formula based on SCr of 3.82 mg/dL (H)). ------------------------------------------------------------------------------------------------------------------ No results for input(s): TSH, T4TOTAL, T3FREE, THYROIDAB in the last 72 hours.  Invalid input(s): FREET3   Coagulation profile No results for input(s): INR, PROTIME in the last 168 hours. ------------------------------------------------------------------------------------------------------------------- No results for input(s): DDIMER in the last 72 hours. -------------------------------------------------------------------------------------------------------------------  Cardiac Enzymes No results for input(s): CKMB, TROPONINI, MYOGLOBIN in the last 168 hours.  Invalid input(s): CK ------------------------------------------------------------------------------------------------------------------    Component Value Date/Time   BNP >4,500.0 (H) 12/06/2019 0454      ---------------------------------------------------------------------------------------------------------------  Urinalysis    Component Value Date/Time   COLORURINE YELLOW 11/25/2019 1012   APPEARANCEUR TURBID (A) 11/25/2019 1012   LABSPEC 1.010 11/25/2019 1012   PHURINE 6.0 11/25/2019 1012   GLUCOSEU NEGATIVE 11/25/2019 1012   HGBUR SMALL (A) 11/25/2019 1012   BILIRUBINUR NEGATIVE 11/25/2019 1012   KETONESUR NEGATIVE 11/25/2019 1012   PROTEINUR 100 (A) 11/25/2019 1012   NITRITE NEGATIVE 11/25/2019 1012   LEUKOCYTESUR MODERATE (A) 11/25/2019 1012    ----------------------------------------------------------------------------------------------------------------   Imaging Results:    DG Chest 2 View  Result Date: 12/11/2019 CLINICAL DATA:  Atrial fibrillation EXAM: CHEST - 2 VIEW COMPARISON:  December 06, 2019. FINDINGS: There is airspace consolidation in the left lower lobe with focal left pleural effusion. Lungs elsewhere are clear. Heart size and pulmonary vascularity are normal. Central catheter tip is in the right atrium slightly beyond the cavoatrial junction. Patient is status post coronary artery bypass grafting with left atrial appendage clamp. There is aortic atherosclerosis. No adenopathy. No bone lesions. There are surgical clips in the right axillary region. IMPRESSION: Left lower lobe airspace opacity concerning for combination of atelectasis and pneumonia with left pleural effusion. Lungs otherwise clear. Heart upper normal in size. Postoperative changes. Central catheter tip in right atrium slightly beyond the cavoatrial junction. No pneumothorax. Aortic Atherosclerosis (ICD10-I70.0). Electronically Signed   By: Lowella Grip III M.D.   On: 12/11/2019 10:41    Radiological Exams on Admission: DG Chest 2 View  Result Date: 12/11/2019 CLINICAL DATA:  Atrial fibrillation EXAM: CHEST - 2 VIEW COMPARISON:  December 06, 2019. FINDINGS: There is airspace  consolidation in the left  lower lobe with focal left pleural effusion. Lungs elsewhere are clear. Heart size and pulmonary vascularity are normal. Central catheter tip is in the right atrium slightly beyond the cavoatrial junction. Patient is status post coronary artery bypass grafting with left atrial appendage clamp. There is aortic atherosclerosis. No adenopathy. No bone lesions. There are surgical clips in the right axillary region. IMPRESSION: Left lower lobe airspace opacity concerning for combination of atelectasis and pneumonia with left pleural effusion. Lungs otherwise clear. Heart upper normal in size. Postoperative changes. Central catheter tip in right atrium slightly beyond the cavoatrial junction. No pneumothorax. Aortic Atherosclerosis (ICD10-I70.0). Electronically Signed   By: Lowella Grip III M.D.   On: 12/11/2019 10:41    DVT Prophylaxis -SCD /TEDs AM Labs Ordered, also please review Full Orders  Family Communication: Admission, patients condition and plan of care including tests being ordered have been discussed with the patient  who indicate understanding and agree with the plan   Code Status -full code  Likely DC to  SNF when rate control improves  Condition   stable  Roxan Hockey M.D on 12/11/2019 at 6:53 PM Go to [email or web address] -  for contact info  [facility] - Office  [phone]

## 2019-12-11 NOTE — ED Notes (Signed)
Witness patient requested NO longer to be DNR.  Dr. Joesph Fillers and myself witnessed with patient Jesse Murphy he wishes to be be a full code.  Dr. Joesph Fillers changed status to full code.

## 2019-12-11 NOTE — ED Provider Notes (Signed)
[facility] EMERGENCY DEPARTMENT Provider Note   CSN: [id number] Arrival date & time: 12/11/19  6659     History Chief Complaint  Patient presents with  . Tachycardia    Jesse Murphy is a 86 y.o. male presenting for evaluation of palpitations, shortness of breath, and weakness.  Patient states just after starting breakfast he started to feel poorly.  He reports feeling his heart was racing, shortness of breath, and weakness.  He called his son who called EMS, and in route to the hospital his symptoms resolved without intervention.  He states he is feeling much better now.  He denies chest pain or current current shortness of breath.  He denies recent fevers, chills, cough.  He was feeling well prior to breakfast today.  He denies recent change in medications.  He denies nausea, vomiting abdominal pain, abnormal bowel movements.  He produces minimal urine, states it has been normal.  Patient is ESRD on dialysis, goes Tuesday, Thursday, Saturday.  Last session was on Saturday, it was normal for him.  He has not gone to dialysis yet today.  He has not yet had his morning medicines, normally takes them after breakfast.  He is no longer on anticoagulation for A. fib as he recently had a GI bleed.  He was in the hospital 5 days ago for A. fib with RVR.  Additional history taken chart review.  Patient with a history of hypertension, paroxysmal A. fib not on anticoagulation due to GI bleed, cardiomyopathy, CHF, history of non-Hodgkin's lymphoma, anemia, CAD status post PCI 1992 and CABG x3 in January 2021, prostate cancer, ESRD on dialysis, diabetes. I reviewed ED notes from 10-17 when patient was in A. fib with RVR, he became hemodynamically unstable and was shocked.  Per chart review, there was concern for pulmonary toxicity with amiodarone, as such patient is not on this.  Per chart review, patient does not appear to be on rate control daily.  Pt's cardiologist is Dr. Domenic Polite.   HPI     Past  Medical History:  Diagnosis Date  . Anemia   . CAD (coronary artery disease)    a. s/p PCI in 1992 b. Coronary CT in 01/2018 showing extensive coronary calcification; FFR indeterminate --> medical management pursued at that time as patient not interested in repeat cath. c. s/p CABGx3 in 02/2019.  . Cancer [facility])    prostate  . Cardiogenic shock ([place])   . CHB (complete heart block) (HCC)    a. due to vagal event in 2021, requiring temp pacer.  . Chronic combined systolic and diastolic CHF (congestive heart failure) ([place])   . Debilitated   . Decubitus ulcer of coccyx, unspecified pressure ulcer stage   . Dyspnea   . End stage renal disease ([place])   . Esophagitis   . H/O tracheostomy    a. during admit in 2021 for respiratory failure.  . Hematuria   . Hypertension   . Hypoalbuminemia   . Ischemic cardiomyopathy   . Myocardial infarction ([place])   . PAF (paroxysmal atrial fibrillation) ([place])   . 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 Pleural effusion, left    a. s/p pleurX catheter  . Pressure ulcer of both heels   . Pseudomonas pneumonia ([place])   . Thrombocytopenia ([place])   . Walker as ambulation aid    also uses wheelchair    Patient Active Problem List   Diagnosis Date Noted  . Tachycardia 12/11/2019  . HCAP (healthcare-associated pneumonia) 12/02/2019  . Ischemic  cardiomyopathy   . History of Pseudomonas pneumonia ([place])   . Stercoral ulcer of rectum   . DNR (do not resuscitate)   . Systolic and diastolic CHF, chronic ([place]) 11/24/2019  . Acute blood loss anemia 11/23/2019  . Adverse effect of antihyperlipidemic and antiarteriosclerotic drugs, initial encounter 11/07/2019  . Pure hypercholesterolemia 11/07/2019  . Skin desquamation 11/07/2019  . Gastrointestinal hemorrhage, unspecified 10/18/2019  . Advanced care planning/counseling discussion   . End stage renal disease on dialysis ([place])   . Palliative care by specialist   . Goals of care, counseling/discussion   . Rectal bleeding  10/13/2019  . Inguinal hernia of left side without obstruction or gangrene   . 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 History of atrial fibrillation- treated surgically, no longer has it per the patient 10/01/2019  . Personal history of prostate cancer 10/01/2019  . Stevens-Johnson syndrome ([place]) 10/01/2019  . Abnormality of gait 09/24/2019  . Hypothyroidism, unspecified 09/19/2019  . Anaphylactic shock, unspecified, initial encounter 09/05/2019  . History of pleural effusion 08/22/2019  . Recurrent left pleural effusion 07/11/2019  . Hematuria 07/09/2019  . Postop check 06/27/2019  . Unspecified protein-calorie malnutrition ([place]) 06/22/2019  . Coagulation defect, unspecified ([place]) 06/19/2019  . AKI (acute kidney injury) ([place]) 06/18/2019  . Anemia in chronic kidney disease 06/14/2019  . Cardiogenic shock ([place]) 06/14/2019  . Coronary angioplasty status 06/14/2019  . Diarrhea, unspecified 06/14/2019  . Iron deficiency anemia, unspecified 06/14/2019  . Pain, unspecified 06/14/2019  . Pruritus, unspecified 06/14/2019  . Secondary hyperparathyroidism of renal origin ([place]) 06/14/2019  . Adult failure to thrive   . History of non-Hodgkin's lymphoma   . Chest tube in place   . Failure to thrive (child)   . Prediabetes   . Hypoalbuminemia due to protein-calorie malnutrition ([place])   . Failure to thrive in adult   . Allergic contact dermatitis   . ESRD on dialysis ([place])   . Dysphagia   . Thrombocytopenia ([place])   . Acute on chronic anemia   . Supplemental oxygen dependent   . Acute systolic congestive heart failure ([place])   . Pressure ulcer 05/23/2019  . Debility 05/22/2019  . Palliative care encounter   . Pressure injury of skin 03/25/2019  . Acute respiratory failure with hypoxia ([place])   . S/P CABG x 3 03/16/2019  . Non-ST elevation (NSTEMI) myocardial infarction ([place])   . Acute on  chronic combined systolic and diastolic CHF (congestive heart failure) ([place])   . Coronary artery disease due to lipid rich plaque   . Atrial fibrillation with RVR ([place]) 03/14/2019  . Atrial fibrillation with rapid ventricular response ([place]) 03/13/2019  . Chest pain 09/12/2018  . Chronic diastolic heart failure ([place]) 05/29/2018  . Hypertension 05/29/2018  . Non Hodgkin's lymphoma ([place]) 09/30/2017  . Heme positive stool 08/11/2017  . Chronic anticoagulation 07/05/2016  . CAD S/P percutaneous coronary angioplasty 07/05/2016  . Type 2 diabetes mellitus without complications ([place]) 42/35/3614  . PAF (paroxysmal atrial fibrillation) ([place]) 06/14/2016  . Chest pain in adult 06/14/2016  . Dyspnea on exertion 06/14/2016  . Allergic rhinitis 06/14/2016  . GERD (gastroesophageal reflux disease) 06/14/2016  . BPH (benign prostatic hyperplasia) 06/14/2016    Past Surgical History:  Procedure Laterality Date  . BASCILIC VEIN TRANSPOSITION Right 09/10/2019   Procedure: RIGHT ARM 1ST STAGE BASCILIC VEIN TRANSPOSITION;  Surgeon: Angelia Mould, MD;  Location: [place];  Service: Vascular;  Laterality: Right;  . BASCILIC VEIN TRANSPOSITION Right 11/06/2019   Procedure: RIGHT UPPER EXTREMITY SECOND STAGE BASCILIC VEIN TRANSPOSITION;  Surgeon: Angelia Mould, MD;  Location: [place];  Service: Vascular;  Laterality: Right;  . BIOPSY  10/15/2019   Procedure: BIOPSY;  Surgeon: Daneil Dolin, MD;  [facility];  Service: Endoscopy;;  Distal Rectal Ulceration biopsies   . CHEST TUBE INSERTION Left 04/23/2019   Procedure: INSERTION PLEURAL DRAINAGE CATHETER TO DRAIN LEFT PLEURAL EFFUSION;  Surgeon: Ivin Poot, MD;  Location: [place];  Service: Thoracic;  Laterality: Left;  . CLIPPING OF ATRIAL APPENDAGE N/A 03/16/2019   Procedure: CLIPPING OF LEFT  ATRIAL APPENDAGE - USING ATRICLIP SIZE 40;  Surgeon: Ivin Poot, MD;  Location: [place];  Service: Open Heart Surgery;  Laterality: N/A;  .  COLONOSCOPY N/A 08/25/2017   Procedure: COLONOSCOPY;  Surgeon: Rogene Houston, MD;  [facility];  Service: Endoscopy;  Laterality: N/A;  . CORONARY ARTERY BYPASS GRAFT N/A 03/16/2019   Procedure: CORONARY ARTERY BYPASS GRAFTING (CABG), ON PUMP, TIMES THREE, USING LEFT INTERNAL MAMMARY ARTERY, RIGHT GREATER SAPHENOUS VEIN HARVESTED ENDOSCOPICALLY;  Surgeon: Ivin Poot, MD;  Location: [place];  Service: Open Heart Surgery;  Laterality: N/A;  swan only  . CYSTOSCOPY WITH FULGERATION N/A 04/30/2019   Procedure: CYSTOSCOPY WITH FULGERATION;  Surgeon: Irine Seal, MD;  Location: [place];  Service: Urology;  Laterality: N/A;  . FLEXIBLE SIGMOIDOSCOPY N/A 10/15/2019   Procedure: FLEXIBLE SIGMOIDOSCOPY with propofol;  Surgeon: Daneil Dolin, MD;  [facility];  Service: Endoscopy;  Laterality: N/A;  . FLEXIBLE SIGMOIDOSCOPY N/A 11/24/2019   Procedure: FLEXIBLE SIGMOIDOSCOPY;  Surgeon: Harvel Quale, MD;  [facility];  Service: Gastroenterology;  Laterality: N/A;  . HERNIA REPAIR    . IR FLUORO GUIDE CV LINE LEFT  04/23/2019  . IR FLUORO GUIDE CV LINE RIGHT  05/24/2019  . IR US GUIDE VASC ACCESS LEFT  04/23/2019  . MAZE N/A 03/16/2019   Procedure: MAZE;  Surgeon: Ivin Poot, MD;  Location: [place];  Service: Open Heart Surgery;  Laterality: N/A;  . PLACEMENT OF IMPELLA LEFT VENTRICULAR ASSIST DEVICE Right 03/27/2019   Procedure: Placement Of Impella Left Ventricular Assist Device using ABIOMED Impella 5.5 with SmartAssist Device.;  Surgeon: Wonda Olds, MD;  [facility];  Service: Thoracic;  Laterality: Right;  . POLYPECTOMY  11/24/2019   Procedure: POLYPECTOMY;  Surgeon: Harvel Quale, MD;  [facility];  Service: Gastroenterology;;  sigmoid  . PROSTATE SURGERY    . REMOVAL OF IMPELLA LEFT VENTRICULAR ASSIST DEVICE N/A 04/10/2019   Procedure: REMOVAL OF IMPELLA LEFT VENTRICULAR ASSIST DEVICE WITH INSERTION OF RIGHT FEMORAL  ARTERIAL LINE;  Surgeon: Ivin Poot, MD;  Location: [place];  Service: Open Heart Surgery;  Laterality: N/A;  . REMOVAL OF PLEURAL DRAINAGE CATHETER Left 07/02/2019   Procedure: REMOVAL OF PLEURAL DRAINAGE CATHETER;  Surgeon: Ivin Poot, MD;  Location: [place];  Service: Thoracic;  Laterality: Left;  . RIGHT/LEFT HEART CATH AND CORONARY ANGIOGRAPHY N/A 03/15/2019   Procedure: RIGHT/LEFT HEART CATH AND CORONARY ANGIOGRAPHY;  Surgeon: Burnell Blanks, MD;  Location: [place] [facility];  Service: Cardiovascular;  Laterality: N/A;  . TEE WITHOUT CARDIOVERSION N/A 03/16/2019   Procedure: TRANSESOPHAGEAL ECHOCARDIOGRAM (TEE);  Surgeon: Prescott Gum, Collier Salina, MD;  Location: [place];  Service: Open Heart Surgery;  Laterality: N/A;  . TRACHEOSTOMY TUBE PLACEMENT N/A 03/27/2019  Procedure: TRACHEOSTOMY placed using Shiley 8DCT Cuffed.;  Surgeon: Wonda Olds, MD;  [facility];  Service: Thoracic;  Laterality: N/A;  . TRANSURETHRAL RESECTION OF BLADDER NECK N/A 05/13/2019   Procedure: CYSTOSCOPY CLOT EVACUATION FULGRATION CYSTOGRAM AND INSTILLATION OF FORMALIN;  Surgeon: Lucas Mallow, MD;  Location: [place];  Service: Urology;  Laterality: N/A;       Family History  Problem Relation Age of Onset  . CAD Mother 16  . CAD Father 14  . CAD Sister   . CAD Brother     Social History   Tobacco Use  . Smoking status: Former Smoker    Packs/day: 0.25    Years: 50.00    Pack years: 12.50    Types: Cigarettes    Quit date: 02/15/1990    Years since quitting: 29.8  . Smokeless tobacco: Never Used  Vaping Use  . Vaping Use: Never used  Substance Use Topics  . Alcohol use: No  . Drug use: No    Home Medications Prior to Admission medications   Medication Sig Start Date End Date Taking? Authorizing Provider  acetaminophen (TYLENOL) 500 MG tablet Take 500 mg by mouth every 6 (six) hours as needed for moderate pain.   Yes [provider]  aspirin 81 MG chewable tablet Chew 81  mg by mouth in the morning.  05/21/19  Yes [provider]  Calcium Carb-Cholecalciferol (CALCIUM CARBONATE-VITAMIN D3) 600-400 MG-UNIT TABS Take 1 tablet by mouth daily.   Yes [provider]  carboxymethylcellulose (REFRESH PLUS) 0.5 % SOLN Place 1 drop into both eyes 4 (four) times daily. 10/08/19  Yes [provider]  docusate sodium (COLACE) 100 MG capsule Take 1 capsule (100 mg total) by mouth 2 (two) times daily. 10/17/19  Yes Gherghe, Vella Redhead, MD  Ensure Plus (ENSURE PLUS) LIQD Take 237 mLs by mouth 2 (two) times daily between meals.   Yes [provider]  midodrine (PROAMATINE) 10 MG tablet Take 10 mg by mouth 2 (two) times daily.  10/08/19 10/07/20 Yes [provider]  Nutritional Supplements (FEEDING SUPPLEMENT, NEPRO CARB STEADY,) LIQD Take 237 mLs by mouth at bedtime.   Yes [provider]  nystatin ointment (MYCOSTATIN) Apply 1 application topically daily as needed (Apply to Penis as needed if itching occurs.).  06/22/19  Yes [provider]  oxyCODONE-acetaminophen (PERCOCET/ROXICET) 5-325 MG tablet Take 1 tablet by mouth every 4 (four) hours as needed for severe pain.   Yes [provider]  polyethylene glycol (MIRALAX / GLYCOLAX) 17 g packet Take 17 g by mouth 2 (two) times daily. 11/25/19  Yes Tat, Shanon Brow, MD  predniSONE (DELTASONE) 10 MG tablet Take 60 mg by mouth daily with breakfast. 11/26/19 12/26/19 Yes [provider]  senna (SENOKOT) 8.6 MG tablet Take 1 tablet by mouth as needed for constipation.  10/08/19 10/07/20 Yes [provider]  sulfamethoxazole-trimethoprim (BACTRIM) 400-80 MG tablet Take 1 tablet by mouth 3 (three) times a week. Mon, wed, and Fri for bacterial infection.   Yes [provider]  triamcinolone cream (KENALOG) 0.1 % Apply 1 application topically 2 (two) times daily. Apply to skin that is intact. DO NOT apply to "burn wounds". 10/08/19 10/07/20 Yes [provider]   famotidine (PEPCID) 20 MG tablet Take 20 mg by mouth daily.  Patient not taking: Reported on 12/11/2019    [provider]  iron sucrose (VENOFER) 20 MG/ML injection Iron Sucrose (Venofer) Patient not taking: Reported on 12/11/2019 11/17/19  11/15/20  [provider]  midodrine (PROAMATINE) 5 MG tablet Take Every Tuesday, Thursday, Saturday Patient not taking: Reported on 12/11/2019 11/07/19   Erma Heritage, PA-C  predniSONE (DELTASONE) 10 MG tablet Take 40 mg by mouth daily.  01/20/2020   [provider]  predniSONE (DELTASONE) 10 MG tablet Take 50 mg by mouth daily with breakfast. 12/26/19 01/30/2020  [provider]    Allergies    Atorvastatin, Adhesive [tape], Amiodarone, Reglan [metoclopramide], and Heparin  Review of Systems   Review of Systems  Respiratory: Positive for shortness of breath (resolved).   Cardiovascular: Positive for palpitations (resolved).  Neurological: Positive for weakness (resolved).  All other systems reviewed and are negative.   Physical Exam Updated Vital Signs BP (!) 101/52   Pulse 78   Temp 98.6 F (37 C) (Oral)   Resp 12   Ht 5\' 8"  (1.727 m)   Wt 64 kg   SpO2 95%   BMI 21.45 kg/m   Physical Exam Vitals and nursing note reviewed.  Constitutional:      General: He is not in acute distress.    Appearance: He is well-developed.     Comments: Elderly male who appears chronically ill. Not in acute distress currently.   HENT:     Head: Normocephalic and atraumatic.     Mouth/Throat:     Mouth: Mucous membranes are dry.  Eyes:     Extraocular Movements: Extraocular movements intact.     Conjunctiva/sclera: Conjunctivae normal.     Pupils: Pupils are equal, round, and reactive to light.  Cardiovascular:     Rate and Rhythm: Tachycardia present. Rhythm irregular.     Pulses: Normal pulses.  Pulmonary:     Effort: Pulmonary effort is normal. No respiratory distress.     Breath sounds: Normal breath  sounds. No wheezing.  Abdominal:     General: There is no distension.     Palpations: Abdomen is soft. There is no mass.     Tenderness: There is no abdominal tenderness. There is no guarding or rebound.  Musculoskeletal:        General: Normal range of motion.     Cervical back: Normal range of motion and neck supple.     Right lower leg: Edema present.     Left lower leg: Edema present.     Comments: 1+ pitting edema of bilateral lower ext  Skin:    General: Skin is warm and dry.     Capillary Refill: Capillary refill takes less than 2 seconds.  Neurological:     Mental Status: He is alert and oriented to person, place, and time.     ED Results / Procedures / Treatments   Labs (all labs ordered are listed, but only abnormal results are displayed) Labs Reviewed  COMPREHENSIVE METABOLIC PANEL - Abnormal; Notable for the following components:      Result Value   Chloride 97 (*)    Glucose, Bld 134 (*)    BUN 37 (*)    Creatinine, Ser 4.28 (*)    Calcium 8.1 (*)    Total Protein 5.5 (*)    Albumin 2.7 (*)    Total Bilirubin 1.4 (*)    GFR, Estimated 13 (*)    All other components within normal limits  CBC WITH DIFFERENTIAL/PLATELET - Abnormal; Notable for the following components:   RBC 3.18 (*)    Hemoglobin 9.7 (*)    HCT 33.5 (*)    MCV 105.3 (*)  MCHC 29.0 (*)    RDW 19.7 (*)    nRBC 2.3 (*)    Abs Immature Granulocytes 0.30 (*)    All other components within normal limits  RESPIRATORY PANEL BY RT PCR (FLU A&B, COVID)  MAGNESIUM  CBC  RENAL FUNCTION PANEL    EKG EKG Interpretation  Date/Time:  Tuesday December 11 2019 09:19:55 EDT Ventricular Rate:  111 PR Interval:    QRS Duration: 103 QT Interval:  294 QTC Calculation: 400 R Axis:   4 Text Interpretation: Atrial fibrillation Probable LVH with secondary repol abnrm Anterior Q waves, possibly due to LVH No STEMI Confirmed by Octaviano Glow [phone]) on 12/11/2019 9:29:58 AM   Radiology DG Chest 2  View  Result Date: 12/11/2019 CLINICAL DATA:  Atrial fibrillation EXAM: CHEST - 2 VIEW COMPARISON:  December 06, 2019. FINDINGS: There is airspace consolidation in the left lower lobe with focal left pleural effusion. Lungs elsewhere are clear. Heart size and pulmonary vascularity are normal. Central catheter tip is in the right atrium slightly beyond the cavoatrial junction. Patient is status post coronary artery bypass grafting with left atrial appendage clamp. There is aortic atherosclerosis. No adenopathy. No bone lesions. There are surgical clips in the right axillary region. IMPRESSION: Left lower lobe airspace opacity concerning for combination of atelectasis and pneumonia with left pleural effusion. Lungs otherwise clear. Heart upper normal in size. Postoperative changes. Central catheter tip in right atrium slightly beyond the cavoatrial junction. No pneumothorax. Aortic Atherosclerosis (ICD10-I70.0). Electronically Signed   By: Lowella Grip III M.D.   On: 12/11/2019 10:41    Procedures .Critical Care Performed by: Franchot Heidelberg, PA-C Authorized by: Franchot Heidelberg, PA-C   Critical care provider statement:    Critical care time (minutes):  35   Critical care time was exclusive of:  Separately billable procedures and treating other patients and teaching time   Critical care was necessary to treat or prevent imminent or life-threatening deterioration of the following conditions:  Cardiac failure   Critical care was time spent personally by me on the following activities:  Blood draw for specimens, development of treatment plan with patient or surrogate, discussions with consultants, evaluation of patient's response to treatment, examination of patient, obtaining history from patient or surrogate, ordering and performing treatments and interventions, ordering and review of laboratory studies, ordering and review of radiographic studies, pulse oximetry, re-evaluation of patient's  condition and review of old charts   I assumed direction of critical care for this patient from another provider in my specialty: no   Comments:     Patient in A. fib with RVR, started on dilt drip.  Admitted to the hospital.   (including critical care time)  Medications Ordered in ED Medications  ceFEPIme (MAXIPIME) 1 g in sodium chloride 0.9 % 100 mL IVPB (has no administration in time range)  vancomycin (VANCOREADY) IVPB 1500 mg/300 mL (1,500 mg Intravenous New Bag/Given 12/11/19 1150)  amiodarone (NEXTERONE) 1.8 mg/mL load via infusion 150 mg (has no administration in time range)    Followed by  amiodarone (NEXTERONE PREMIX) 360-4.14 MG/200ML-% (1.8 mg/mL) IV infusion (has no administration in time range)    Followed by  amiodarone (NEXTERONE PREMIX) 360-4.14 MG/200ML-% (1.8 mg/mL) IV infusion (has no administration in time range)  0.9 %  sodium chloride infusion (has no administration in time range)  0.9 %  sodium chloride infusion (has no administration in time range)  alteplase (CATHFLO ACTIVASE) injection 2 mg (has no administration in time range)  lidocaine (PF) (XYLOCAINE) 1 % injection 5 mL (has no administration in time range)  lidocaine-prilocaine (EMLA) cream 1 application (has no administration in time range)  pentafluoroprop-tetrafluoroeth (GEBAUERS) aerosol 1 application (has no administration in time range)  diltiazem (CARDIZEM) 1 mg/mL load via infusion 10 mg (10 mg Intravenous Bolus from Bag 12/11/19 0943)    ED Course  I have reviewed the triage vital signs and the nursing notes.  Pertinent labs & imaging results that were available during my care of the patient were reviewed by me and considered in my medical decision making (see chart for details).  Clinical Course as of Dec 11 1151  Tue Dec 11, 2019  1053 IMPRESSION: Left lower lobe airspace opacity concerning for combination of atelectasis and pneumonia with left pleural effusion. Lungs otherwise clear.  Heart upper normal in size. Postoperative changes. Central catheter tip in right atrium slightly beyond the cavoatrial junction. No pneumothorax.   [MT]  [age] yo male w/ hx of afib (on aspirni only as he was a high GI bleed risk) presenting to ED with tachycardia and palpitations.  Here he was in A Fib with RVR, now rates improved to 100-110 bpm on diltizem infusion.  Xray shows possible PNA in lungs.  Plan to discuss with cardiology, anticipate likely admission.  He is on dialysis Tues, Thurs, Sat, hasn't missed any sessions, and does make a small amount of urine daily.  Left chest wall port on exam.   [MT]    Clinical Course User Index [MT] Trifan, Carola Rhine, MD   MDM Rules/Calculators/A&P                          Patient presenting for evaluation of palpitations, shortness of breath, and weakness.  Symptoms have resolved, however on evaluation, patient remains in A. fib with RVR with a heart rate between 110 and 120.  He is hemodynamically stable at this time.  As he is not on anticoagulation, he is not a candidate for cardioversion.  Per chart review, he has tolerated built well in the past, will start this.  Will obtain labs to look for underlying metabolic cause.  Patient without infectious symptoms, however also consider pneumonia and/or Covid is triggering event.  Likely need to talk with cardiology and patient will likely need to be admitted for further management. Case discussed with attending, Dr. Langston Masker evaluated the pt.   Labs interpreted by me, overall reassuring.  Hemoglobin stable.  Electrolytes stable.  Covid negative.  Chest x-ray viewed interpreted by me, concerning for left lower lobe pneumonia which was not present 5 days ago.  Patient without infectious symptoms, however consider early pneumonia that could be triggering A. fib.  Will cover with antibiotics, as patient is a long-term healthcare facility and has been in our hospital multiple times recently.  Will consult  cardiology.  Discussed with Dr. Harl Bowie from cardiology.  Patient's heart rate is improving on the dilt drip, he remains in A. fib.  Recommends admission to medicine, cardiology to consult.  Discussed with Dr. Denton Brick from [facility], patient to be admitted.  Final Clinical Impression(s) / ED Diagnoses Final diagnoses:  Atrial fibrillation with RVR ([place])  Pneumonia of left lower lobe due to infectious organism    Rx / DC Orders ED Discharge Orders    None       Franchot Heidelberg, PA-C 12/11/19 1153    Wyvonnia Dusky, MD 12/11/19 [phone]

## 2019-12-11 NOTE — ED Notes (Signed)
Son at bedside.

## 2019-12-11 NOTE — ED Triage Notes (Signed)
Per EMS, pt resident of [facility] and facility states pt has history of same and reports elevated heart rate and weakness today. Pt denies pain. Pt states "my heart was about to jump out of my body this morning." pt reports "I feel better now." pt recently treated for similar on 10/21.

## 2019-12-11 NOTE — Progress Notes (Signed)
Patient known to cardiology service, had seen just last week in consultation for SVT in setting of pneumonia and high dose steroid use, he was cardioverted in ER staff. Presents today with recurrent palpitations and tachcyardia, EKG and tele review show MAT.   Notes mention prior bradycardia on beta blocker in the past. 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 Has not been on av nodal agents since. Also history of hypotension on midodrine.   Some question of prior rash on amio vs statin in the past, it was unclear which was the issue. 01/31/2018 notes mention amio stopped due to dyspnea, I do not see a formal diagnosis of amio toxicity made at the time as not PFTs completed.      Given limitations on management for his MAT, would retry amiodarone and continue to avoid av nodal agents. In setting of MAT no anticoag is indicated. Of note history of PAF in the past, not on anticoag due to history of GI bleeding   D/c diltiazem, start amio gtt. We will follow rhythm during this admission   Carlyle Dolly MD

## 2019-12-12 ENCOUNTER — Encounter: Payer: Medicare Other | Admitting: Cardiothoracic Surgery

## 2019-12-12 DIAGNOSIS — Z20822 Contact with and (suspected) exposure to covid-19: Secondary | ICD-10-CM | POA: Diagnosis not present

## 2019-12-12 DIAGNOSIS — I2510 Atherosclerotic heart disease of native coronary artery without angina pectoris: Secondary | ICD-10-CM | POA: Diagnosis not present

## 2019-12-12 DIAGNOSIS — Z7982 Long term (current) use of aspirin: Secondary | ICD-10-CM | POA: Diagnosis not present

## 2019-12-12 DIAGNOSIS — Z87891 Personal history of nicotine dependence: Secondary | ICD-10-CM | POA: Diagnosis not present

## 2019-12-12 DIAGNOSIS — E1122 Type 2 diabetes mellitus with diabetic chronic kidney disease: Secondary | ICD-10-CM | POA: Diagnosis not present

## 2019-12-12 DIAGNOSIS — E039 Hypothyroidism, unspecified: Secondary | ICD-10-CM | POA: Diagnosis not present

## 2019-12-12 DIAGNOSIS — I471 Supraventricular tachycardia: Secondary | ICD-10-CM | POA: Diagnosis not present

## 2019-12-12 DIAGNOSIS — Z8546 Personal history of malignant neoplasm of prostate: Secondary | ICD-10-CM | POA: Diagnosis not present

## 2019-12-12 DIAGNOSIS — N186 End stage renal disease: Secondary | ICD-10-CM | POA: Diagnosis not present

## 2019-12-12 DIAGNOSIS — I132 Hypertensive heart and chronic kidney disease with heart failure and with stage 5 chronic kidney disease, or end stage renal disease: Secondary | ICD-10-CM | POA: Diagnosis not present

## 2019-12-12 DIAGNOSIS — Z9861 Coronary angioplasty status: Secondary | ICD-10-CM | POA: Diagnosis not present

## 2019-12-12 DIAGNOSIS — I4891 Unspecified atrial fibrillation: Secondary | ICD-10-CM | POA: Diagnosis not present

## 2019-12-12 DIAGNOSIS — Z79899 Other long term (current) drug therapy: Secondary | ICD-10-CM | POA: Diagnosis not present

## 2019-12-12 DIAGNOSIS — R002 Palpitations: Secondary | ICD-10-CM | POA: Diagnosis present

## 2019-12-12 DIAGNOSIS — I5042 Chronic combined systolic (congestive) and diastolic (congestive) heart failure: Secondary | ICD-10-CM | POA: Diagnosis not present

## 2019-12-12 DIAGNOSIS — J181 Lobar pneumonia, unspecified organism: Secondary | ICD-10-CM | POA: Diagnosis not present

## 2019-12-12 LAB — CBC
HCT: 31.3 % — ABNORMAL LOW (ref 39.0–52.0)
Hemoglobin: 9.1 g/dL — ABNORMAL LOW (ref 13.0–17.0)
MCH: 30.4 pg (ref 26.0–34.0)
MCHC: 29.1 g/dL — ABNORMAL LOW (ref 30.0–36.0)
MCV: 104.7 fL — ABNORMAL HIGH (ref 80.0–100.0)
Platelets: 251 10*3/uL (ref 150–400)
RBC: 2.99 MIL/uL — ABNORMAL LOW (ref 4.22–5.81)
RDW: 19.6 % — ABNORMAL HIGH (ref 11.5–15.5)
WBC: 7.7 10*3/uL (ref 4.0–10.5)
nRBC: 1.3 % — ABNORMAL HIGH (ref 0.0–0.2)

## 2019-12-12 LAB — COMPREHENSIVE METABOLIC PANEL
ALT: 13 U/L (ref 0–44)
AST: 15 U/L (ref 15–41)
Albumin: 2.6 g/dL — ABNORMAL LOW (ref 3.5–5.0)
Alkaline Phosphatase: 58 U/L (ref 38–126)
Anion gap: 13 (ref 5–15)
BUN: 45 mg/dL — ABNORMAL HIGH (ref 8–23)
CO2: 27 mmol/L (ref 22–32)
Calcium: 8.2 mg/dL — ABNORMAL LOW (ref 8.9–10.3)
Chloride: 95 mmol/L — ABNORMAL LOW (ref 98–111)
Creatinine, Ser: 4.6 mg/dL — ABNORMAL HIGH (ref 0.61–1.24)
GFR, Estimated: 12 mL/min — ABNORMAL LOW (ref >60)
Glucose, Bld: 143 mg/dL — ABNORMAL HIGH (ref 70–99)
Potassium: 4.9 mmol/L (ref 3.5–5.1)
Sodium: 135 mmol/L (ref 135–145)
Total Bilirubin: 0.7 mg/dL (ref 0.3–1.2)
Total Protein: 5.3 g/dL — ABNORMAL LOW (ref 6.5–8.1)

## 2019-12-12 MED ORDER — PANTOPRAZOLE SODIUM 40 MG PO TBEC: 40.0000 mg | DELAYED_RELEASE_TABLET | Freq: Every day | ORAL | 4 refills | Status: AC

## 2019-12-12 MED ORDER — AMIODARONE HCL 200 MG PO TABS: 200.0000 mg | ORAL_TABLET | ORAL | 2 refills | Status: AC

## 2019-12-12 MED ORDER — OXYCODONE-ACETAMINOPHEN 5-325 MG PO TABS: 1.0000 | ORAL_TABLET | ORAL | 0 refills | Status: DC | PRN

## 2019-12-12 MED ORDER — SODIUM CHLORIDE 0.9 % IV SOLN: 100.0000 mL | INTRAVENOUS | Status: DC | PRN

## 2019-12-12 MED ORDER — BISACODYL 10 MG RE SUPP
10.0000 mg | Freq: Once | RECTAL | Status: AC
  Administered 2019-12-12: 10 mg via RECTAL
  Filled 2019-12-12: qty 1

## 2019-12-12 MED ORDER — ALTEPLASE 2 MG IJ SOLR: 2.0000 mg | Freq: Once | INTRAMUSCULAR | Status: DC | PRN

## 2019-12-12 MED ORDER — AMIODARONE HCL 200 MG PO TABS
200.0000 mg | ORAL_TABLET | Freq: Two times a day (BID) | ORAL | Status: DC
  Administered 2019-12-12: 200 mg via ORAL
  Filled 2019-12-12: qty 1

## 2019-12-12 MED ORDER — CHLORHEXIDINE GLUCONATE CLOTH 2 % EX PADS
6.0000 | MEDICATED_PAD | Freq: Every day | CUTANEOUS | Status: DC
  Administered 2019-12-12: 6 via TOPICAL

## 2019-12-12 MED ORDER — POLYETHYLENE GLYCOL 3350 17 G PO PACK: 17.0000 g | PACK | Freq: Two times a day (BID) | ORAL | 4 refills | Status: AC

## 2019-12-12 MED ORDER — SENNOSIDES-DOCUSATE SODIUM 8.6-50 MG PO TABS
2.0000 | ORAL_TABLET | Freq: Two times a day (BID) | ORAL | 3 refills | Status: AC
Start: 2019-12-12 — End: 2020-12-11

## 2019-12-12 NOTE — Procedures (Signed)
I was present at this dialysis session. I have reviewed the session itself and made appropriate changes.   Vital signs in last 24 hours:  Temp:  [97.6 F (36.4 C)-98.4 F (36.9 C)] 97.9 F (36.6 C) (10/27 1100) Pulse Rate:  [35-96] 82 (10/27 1200) Resp:  [12-24] 18 (10/27 1100) BP: (113-138)/(49-75) 124/68 (10/27 1200) SpO2:  [92 %-100 %] 100 % (10/27 1100) Weight:  [63.8 kg] 63.8 kg (10/27 1100) Weight change:  Filed Weights   12/11/19 0909 12/12/19 1100  Weight: 64 kg 63.8 kg    Recent Labs  Lab 12/11/19 1219 12/11/19 1219 12/12/19 0356  NA 136   < > 135  K 4.3   < > 4.9  CL 99   < > 95*  CO2 25   < > 27  GLUCOSE 107*   < > 143*  BUN 34*   < > 45*  CREATININE 3.82*   < > 4.60*  CALCIUM 7.4*   < > 8.2*  PHOS 3.3  --   --    < > = values in this interval not displayed.    Recent Labs  Lab 12/06/19 0454 12/06/19 0454 12/11/19 0920 12/11/19 1219 12/12/19 0356  WBC 7.8   < > 9.1 9.6 7.7  NEUTROABS 5.2  --  7.4  --   --   HGB 9.4*   < > 9.7* 8.9* 9.1*  HCT 31.1*   < > 33.5* 31.2* 31.3*  MCV 102.3*   < > 105.3* 108.3* 104.7*  PLT 216   < > 282 256 251   < > = values in this interval not displayed.    Scheduled Meds: . amiodarone  200 mg Oral BID  . aspirin  81 mg Oral q AM  . bisacodyl  10 mg Rectal QHS  . calcium-vitamin D  1 tablet Oral Q breakfast  . Chlorhexidine Gluconate Cloth  6 each Topical Daily  . Chlorhexidine Gluconate Cloth  6 each Topical Q0600  . feeding supplement  237 mL Oral BID BM  . feeding supplement (NEPRO CARB STEADY)  237 mL Oral QHS  . midodrine  10 mg Oral BID WC  . polyethylene glycol  17 g Oral BID  . polyvinyl alcohol  1 drop Both Eyes QID  . predniSONE  60 mg Oral Q breakfast  . senna-docusate  2 tablet Oral QHS  . sodium chloride flush  3 mL Intravenous Q12H  . sodium chloride flush  3 mL Intravenous Q12H   Continuous Infusions: . sodium chloride    . sodium chloride    . sodium chloride    . sodium chloride    .  sodium chloride     PRN Meds:.sodium chloride, sodium chloride, sodium chloride, sodium chloride, sodium chloride, acetaminophen **OR** acetaminophen, alteplase, alteplase, lidocaine (PF), lidocaine-prilocaine, ondansetron **OR** ondansetron (ZOFRAN) IV, oxyCODONE-acetaminophen, pentafluoroprop-tetrafluoroeth, polyethylene glycol, sodium chloride flush, traZODone    Dialysis Access:[facility], RUE AVF +T/B  [facility], 4 hr, 180, 400/800, EDW 62.5kg, 2K/2.25Ca; CVC 9/30/ Phos 3.3, Ca 8.2, PTH 173 10/7 last post wt 61.9kg, Hb 8, mircera 225 q2wks  Assessment/Plan: 1. Multifocal Atrial tachycradia with RVR - negative covid-19, negative influenza. now on amiodarone per Cardiology and is in [place].  Off of dilt 2. ESRD- Plan for HD today to keep on outpatient schedule. 3. Hypertension/volume- low bp. Continue with chronic midodrine. 4. Anemia- h/o GI Bleed. Will cont with ESA and transfuse as needed 5. Metabolic bone disease- Stable, cont with outpatient meds 6.  Nutrition- Renal diet 7. Vascular access- please avoid IV's in right arm due to RUE AVF. 8. H/o GIB- no heparin with HD. 9. SVT with RVR and hypotension- s/p synchronized cardioversion. 10. 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 11. Deconditioning- cont with PT/OT and return to SNF. 12. Chronic hypotension- on midodrine. 17. H/o prostate cancer s/p seed implants 14. Disposition- to return to SNF after HD and f/u with outpatient HD tomorrow.  Donetta Potts,  MD 12/12/2019, 12:07 PM

## 2019-12-12 NOTE — Discharge Instructions (Signed)
1) Take oral amiodarone 200mg  bid x 4 weeks, then 200mg  daily after that for irregular and fast heart rate 2)Check TSH in 3 months 3) Keep your appointment with hematologist at [facility] for adjustments of your prednisone for pemphigus vulgaris 4) Continue outpatient Hemodialysis on Tuesdays Thursdays and Saturdays 5)-Outpatient follow-up with cardiologist on 12/27/2019 advised

## 2019-12-12 NOTE — Care Management Obs Status (Signed)
[facility] NOTIFICATION   Patient Details  Name: Jesse Murphy MRN: [id number] Date of Birth: [birth year]   Medicare Observation Status Notification Given:  Yes ([address], [place], [place] [address])    Tommy Medal 12/12/2019, 12:37 PM

## 2019-12-12 NOTE — Progress Notes (Signed)
Telemetry reviewed, MAT has resolved and now back in [facility]. Had been started on amio gtt, looks like discontinued and started on oral amio yesterday. Would increase oral amio to 200mg  bid x 4 weeks, then 200mg  daily. WOuld continue to avoid av nodal agents as mentioned in prior note. We will sign off of inpatient care, please call with questions   Carlyle Dolly MD

## 2019-12-12 NOTE — Discharge Summary (Signed)
Jesse Murphy, is a 86 y.o. male  DOB 1928/12/22  MRN [id number].  Admission date:  12/11/2019  Admitting Physician  Roxan Hockey, MD  Discharge Date:  12/12/2019   Primary MD  Leeanne Rio, MD  Recommendations for primary care physician for things to follow:   1)Take oral amiodarone 200mg  bid x 4 weeks, then 200mg  daily after that for irregular and fast heart rate 2)Check TSH in 3 months 3) keep your appointment with hematologist at [facility] for adjustments of your prednisone for pemphigus vulgaris 4) continue outpatient hemodialysis on Tuesdays Thursdays and Saturdays 5)-Outpatient follow-up with cardiologist on 12/27/2019 advised  Admission Diagnosis  Multifocal atrial tachycardia (HCC) [I47.1] Tachycardia [R00.0] Atrial fibrillation with RVR ([place]) [I48.91] Pneumonia of left lower lobe due to infectious organism [J18.9]   Discharge Diagnosis  Multifocal atrial tachycardia (HCC) [I47.1] Tachycardia [R00.0] Atrial fibrillation with RVR ([place]) [I48.91] Pneumonia of left lower lobe due to infectious organism [J18.9]    Principal Problem:   Multifocal atrial tachycardia (HCC) Active Problems:   CAD S/P percutaneous coronary angioplasty   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 Anemia   . CAD (coronary artery disease)    a. s/p PCI in 1992 b. Coronary CT in 01/2018 showing extensive coronary calcification; FFR indeterminate --> medical management pursued at that time as patient not interested in repeat cath. c. s/p CABGx3 in 02/2019.  . [facility])    prostate  . Cardiogenic shock ([place])   . CHB (complete heart block) (HCC)    a. due to vagal event in 2021, requiring temp  pacer.  . Chronic combined systolic and diastolic CHF (congestive heart failure) ([place])   . Debilitated   . Decubitus ulcer of coccyx, unspecified pressure ulcer stage   . Dyspnea   . End stage renal disease ([place])   . Esophagitis   . H/O tracheostomy    a. during admit in 2021 for respiratory failure.  . Hematuria   . Hypertension   . Hypoalbuminemia   . Ischemic cardiomyopathy   . Myocardial infarction ([place])   . PAF (paroxysmal atrial fibrillation) ([place])   . 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 Pleural effusion, left    a. s/p pleurX catheter  . Pressure ulcer of both heels   . Pseudomonas pneumonia ([place])   . Thrombocytopenia ([place])   . Walker as ambulation aid    also uses wheelchair    Past Surgical History:  Procedure Laterality Date  . BASCILIC VEIN TRANSPOSITION Right 09/10/2019   Procedure: RIGHT ARM 1ST STAGE BASCILIC VEIN TRANSPOSITION;  Surgeon: Angelia Mould, MD;  Location: [place];  Service: Vascular;  Laterality: Right;  . BASCILIC VEIN TRANSPOSITION Right 11/06/2019   Procedure: RIGHT UPPER EXTREMITY SECOND STAGE BASCILIC VEIN TRANSPOSITION;  Surgeon: Angelia Mould,  MD;  Location: [place];  Service: Vascular;  Laterality: Right;  . BIOPSY  10/15/2019   Procedure: BIOPSY;  Surgeon: Daneil Dolin, MD;  [facility];  Service: Endoscopy;;  Distal Rectal Ulceration biopsies   . CHEST TUBE INSERTION Left 04/23/2019   Procedure: INSERTION PLEURAL DRAINAGE CATHETER TO DRAIN LEFT PLEURAL EFFUSION;  Surgeon: Ivin Poot, MD;  Location: [place];  Service: Thoracic;  Laterality: Left;  . CLIPPING OF ATRIAL APPENDAGE N/A 03/16/2019   Procedure: CLIPPING OF LEFT  ATRIAL APPENDAGE - USING ATRICLIP SIZE 40;  Surgeon: Ivin Poot, MD;  Location: [place];  Service: Open Heart Surgery;  Laterality: N/A;  . COLONOSCOPY N/A 08/25/2017   Procedure: COLONOSCOPY;  Surgeon: Rogene Houston, MD;  [facility];  Service: Endoscopy;  Laterality: N/A;  . CORONARY  ARTERY BYPASS GRAFT N/A 03/16/2019   Procedure: CORONARY ARTERY BYPASS GRAFTING (CABG), ON PUMP, TIMES THREE, USING LEFT INTERNAL MAMMARY ARTERY, RIGHT GREATER SAPHENOUS VEIN HARVESTED ENDOSCOPICALLY;  Surgeon: Ivin Poot, MD;  Location: [place];  Service: Open Heart Surgery;  Laterality: N/A;  swan only  . CYSTOSCOPY WITH FULGERATION N/A 04/30/2019   Procedure: CYSTOSCOPY WITH FULGERATION;  Surgeon: Irine Seal, MD;  Location: [place];  Service: Urology;  Laterality: N/A;  . FLEXIBLE SIGMOIDOSCOPY N/A 10/15/2019   Procedure: FLEXIBLE SIGMOIDOSCOPY with propofol;  Surgeon: Daneil Dolin, MD;  [facility];  Service: Endoscopy;  Laterality: N/A;  . FLEXIBLE SIGMOIDOSCOPY N/A 11/24/2019   Procedure: FLEXIBLE SIGMOIDOSCOPY;  Surgeon: Harvel Quale, MD;  [facility];  Service: Gastroenterology;  Laterality: N/A;  . HERNIA REPAIR    . IR FLUORO GUIDE CV LINE LEFT  04/23/2019  . IR FLUORO GUIDE CV LINE RIGHT  05/24/2019  . IR US GUIDE VASC ACCESS LEFT  04/23/2019  . MAZE N/A 03/16/2019   Procedure: MAZE;  Surgeon: Ivin Poot, MD;  Location: [place];  Service: Open Heart Surgery;  Laterality: N/A;  . PLACEMENT OF IMPELLA LEFT VENTRICULAR ASSIST DEVICE Right 03/27/2019   Procedure: Placement Of Impella Left Ventricular Assist Device using ABIOMED Impella 5.5 with SmartAssist Device.;  Surgeon: Wonda Olds, MD;  [facility];  Service: Thoracic;  Laterality: Right;  . POLYPECTOMY  11/24/2019   Procedure: POLYPECTOMY;  Surgeon: Harvel Quale, MD;  [facility];  Service: Gastroenterology;;  sigmoid  . PROSTATE SURGERY    . REMOVAL OF IMPELLA LEFT VENTRICULAR ASSIST DEVICE N/A 04/10/2019   Procedure: REMOVAL OF IMPELLA LEFT VENTRICULAR ASSIST DEVICE WITH INSERTION OF RIGHT FEMORAL ARTERIAL LINE;  Surgeon: Ivin Poot, MD;  Location: [place];  Service: Open Heart Surgery;  Laterality: N/A;  . REMOVAL OF PLEURAL DRAINAGE CATHETER Left 07/02/2019     Procedure: REMOVAL OF PLEURAL DRAINAGE CATHETER;  Surgeon: Ivin Poot, MD;  Location: [place];  Service: Thoracic;  Laterality: Left;  . RIGHT/LEFT HEART CATH AND CORONARY ANGIOGRAPHY N/A 03/15/2019   Procedure: RIGHT/LEFT HEART CATH AND CORONARY ANGIOGRAPHY;  Surgeon: Burnell Blanks, MD;  Location: [place] [facility];  Service: Cardiovascular;  Laterality: N/A;  . TEE WITHOUT CARDIOVERSION N/A 03/16/2019   Procedure: TRANSESOPHAGEAL ECHOCARDIOGRAM (TEE);  Surgeon: Prescott Gum, Collier Salina, MD;  Location: [place];  Service: Open Heart Surgery;  Laterality: N/A;  . TRACHEOSTOMY TUBE PLACEMENT N/A 03/27/2019   Procedure: TRACHEOSTOMY placed using Shiley 8DCT Cuffed.;  Surgeon: Wonda Olds, MD;  [facility];  Service: Thoracic;  Laterality: N/A;  .  TRANSURETHRAL RESECTION OF BLADDER NECK N/A 05/13/2019   Procedure: CYSTOSCOPY CLOT EVACUATION FULGRATION CYSTOGRAM AND INSTILLATION OF FORMALIN;  Surgeon: Lucas Mallow, MD;  Location: [place];  Service: Urology;  Laterality: N/A;      HPI  from the history and physical done on the day of admission:     Jesse Murphy  is a 86 y.o. 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 Harl Bowie -patient's medical records/Notes mention prior bradycardia on beta blocker in the past. After extensive search  through his very complex records he had an event 05/11/19 while an inpatient of profound bradycardia to teens during a bladder irrigation requiring temp pacemaker placement thought to be vagally mediated. Has not been on av nodal agents since. Also history of hypotension on midodrine.   Some question of prior rash on amio vs statin in the past, it was unclear which was the issue.01/31/2018 notes mention amio stopped due to dyspnea, no formal diagnosis of amio toxicity made at the time as not PFTs completed. Given limitations on management for his MAT, would retry amiodarone and continue to avoid av nodal agents. In setting of MAT no anticoag is indicated. Of note history of PAF in the past, not on anticoag due to history of GI bleeding D/c diltiazem, start amio gtt.  -The above highlighted information was obtained from cardiologist Dr. 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 Carlyle Dolly -He is impression and recommendations are highlighted below -As per  cardiologist Dr. Harl Bowie -patient's medical records/Notes mention prior bradycardia on beta blocker in the past. 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 Has not been on av nodal agents since. Also history of hypotension on midodrine.   Some question of prior rash on amio vs statin in the past, it was unclear which was the issue.01/31/2018 notes mention amio stopped due to dyspnea, no formal diagnosis of amio toxicity made at the time as not PFTs completed. Given limitations on management for his MAT, would retry amiodarone and continue to  avoid av nodal agents. In setting of MAT no anticoag is indicated. Of note history of PAF in the past, not on anticoag due to history of GI bleeding D/c diltiazem, start amio gtt.  -The above highlighted information was obtained from cardiologist Dr. Carlyle Dolly - 12/12/19 -Patient converted back to sinus rhythm yesterday evening, cardiologist recommends p.o. amiodarone 200mg  bid x 4 weeks, then 200mg  daily -Outpatient follow-up with cardiologist on 12/27/2019 advised  --Pt will Not tolerate beta-blockers or other AV nodal blocking agent due to propensity towards severe, profound bradycardia and hypotension -  2)H/o CAD----CAD with prior three-vessel CABG in January 2001, and prior stent placement, with ischemic cardiomyopathy -No ACS type symptoms at this time -Unable to use beta-blockers due to prior history of significant profound bradycardia  3)ESRD--- TTS schedule usually, nephrology consult appreciated -Continue midodrine especially with hemodialysis to avoid hypotension -Last HD session 12/12/2019  4)Pemphigus Vulgaris--previously diagnosed by biopsy at [facility] this August -completed Bactrim from 10/08/19 thru 12/08/19 --Continue very  high-dose prednisone with very slow taper over months and not weeks currently on 60 mg daily of prednisone -Patient has an appointment coming up with [facility] on 12/14/2019 for possible adjustment of his prednisone therapy  5) chest x-ray finding of possible pneumonia--patient received Vanco and cefepime in the ED this admission -Hold off on further antibiotics for now as patient is clinically asymptomatic from a pneumonia standpoint -Recently completed antibiotics for presumed HCAP  6)Social/Ethics--- patient was previously a DNR/DNI, patient had universal DNR papers from SNF facility -RN Ala Bent at bedside, patient tells me and RN that he wants to be a full code -CODE STATUS changed per patient request  --universal DNR removed from records per patient's request -Patient is now a full code  7) chronic constipation--with recent stercoral ulcer--- continue aggressive bowel regimen including MiraLAX twice daily and Senokot-S  8) NSTEMI cardiomyopathy with combined diastolic and systolic dysfunction CHF--Echo from July 2021 with EF of 55 to 60%- -will not tolerate beta-blockers due to propensity to wear severe, profound bradycardia and hypotension  9) history of prostate cancer status post prior seed implants with resulting radiation proctitis  10)Bil heel and sacral pressure ulcers--- c/n local wound care  Disposition--discharge back to [facility] SNF Dispo: The patient is from: SNF  Anticipated d/c is to: SNF  Discharge Condition: Stable  Follow UP   Follow-up Information    Isaiah Serge, NP Follow up on 12/27/2019.   Specialties: Cardiology, Radiology Why: Cardiology Hospital Follow-up on 12/27/2019 at 1:30 PM.  Contact information: [address] [place] [address] [phone]                Consults obtained -discussed with Dr. Harl Bowie from cardiology service  Diet and Activity recommendation:  As advised  Discharge Instructions     Discharge Instructions    Call MD for:  difficulty breathing, headache or visual disturbances   Complete by: As directed    Call MD for:  extreme fatigue   Complete by: As directed    Call MD for:  persistant dizziness or light-headedness   Complete by: As directed    Call MD for:  persistant nausea and vomiting   Complete by: As directed    Call MD for:  severe uncontrolled pain   Complete by: As directed    Call MD for:  temperature >100.4   Complete by: As directed    Diet - low sodium heart healthy   Complete by: As  directed    Discharge instructions   Complete by: As directed    1) take oral amiodarone 200mg  bid x 4 weeks, then 200mg  daily after that for irregular and fast heart rate 2)Check TSH in 3 months 3) keep your appointment with hematologist at [facility] for adjustments of your prednisone for pemphigus vulgaris 4) continue outpatient hemodialysis on Tuesdays Thursdays and Saturdays   Discharge wound care:   Complete by: As directed    As advised   Increase activity slowly   Complete by: As directed        Discharge Medications     Allergies as of 12/12/2019      Reactions   Atorvastatin Anaphylaxis, Rash   Leg pain   Adhesive [tape]    Tears skin   Reglan [metoclopramide]    Rash (see 05/2019 hospital notes), but was on other medications at the time so unclear which agent   Heparin Rash   Thrombocytopenia/ HIT      Medication List    STOP taking these medications   docusate sodium 100 MG capsule Commonly known as: COLACE   famotidine 20 MG tablet Commonly known as: PEPCID   iron sucrose 20 MG/ML injection Commonly known as: VENOFER   senna 8.6 MG tablet Commonly known as: SENOKOT   sulfamethoxazole-trimethoprim 400-80 MG tablet Commonly known as: BACTRIM     TAKE these medications   acetaminophen 500 MG tablet Commonly known as: TYLENOL Take 500 mg by mouth every 6 (six) hours as needed for moderate pain.     amiodarone 200 MG tablet Commonly known as: PACERONE Take 1 tablet (200 mg total) by mouth See admin instructions. oral amiodarone 200mg  bid x 4 weeks, then 200mg  daily after that   aspirin 81 MG chewable tablet Chew 81 mg by mouth in the morning.   Calcium Carbonate-Vitamin D3 600-400 MG-UNIT Tabs Take 1 tablet by mouth daily.   carboxymethylcellulose 0.5 % Soln Commonly known as: REFRESH PLUS Place 1 drop into both eyes 4 (four) times daily.   Ensure Plus Liqd Take 237 mLs by mouth 2 (two) times daily between meals.   feeding supplement (NEPRO CARB STEADY) Liqd Take 237 mLs by mouth at bedtime.   midodrine 10 MG tablet Commonly known as: PROAMATINE Take 10 mg by mouth 2 (two) times daily. What changed: Another medication with the same name was removed. Continue taking this medication, and follow the directions you see here.   nystatin ointment Commonly known as: MYCOSTATIN Apply 1 application topically daily as needed (Apply to Penis as needed if itching occurs.).   oxyCODONE-acetaminophen 5-325 MG tablet Commonly known as: PERCOCET/ROXICET Take 1 tablet by mouth every 4 (four) hours as needed for severe pain.   pantoprazole 40 MG tablet Commonly known as: Protonix Take 1 tablet (40 mg total) by mouth daily.   polyethylene glycol 17 g packet Commonly known as: MIRALAX / GLYCOLAX Take 17 g by mouth 2 (two) times daily.   predniSONE 10 MG tablet Commonly known as: DELTASONE Take 60 mg by mouth daily with breakfast.   predniSONE 10 MG tablet Commonly known as: DELTASONE Take 50 mg by mouth daily with breakfast. Start taking on: December 26, 2019   predniSONE 10 MG tablet Commonly known as: DELTASONE Take 40 mg by mouth daily. Start taking on: January 25, 2020   senna-docusate 8.6-50 MG tablet Commonly known as: Senokot-S Take 2 tablets by mouth 2 (two) times daily.   triamcinolone cream 0.1 % Commonly known  as: KENALOG Apply 1 application topically 2  (two) times daily. Apply to skin that is intact. DO NOT apply to "burn wounds".            Discharge Care Instructions  (From admission, onward)         Start     Ordered   12/12/19 0000  Discharge wound care:       Comments: As advised   12/12/19 1102          Major procedures and Radiology Reports - PLEASE review detailed and final reports for all details, in brief -    DG Chest 1 View  Result Date: 11/19/2019 CLINICAL DATA:  LEFT pleural effusion post thoracentesis, coronary artery disease, end-stage renal disease EXAM: CHEST  1 VIEW COMPARISON:  11/14/2019 FINDINGS: RIGHT jugular dual-lumen central venous catheter tip projecting over superior RIGHT atrium. Normal heart size post CABG and Maze procedure. Atherosclerotic calcification aorta. Minimal residual LEFT pleural effusion and basilar atelectasis post thoracentesis. No pneumothorax. Osseous structures unremarkable. IMPRESSION: No pneumothorax following LEFT thoracentesis. Minimal residual LEFT pleural effusion and basilar atelectasis. Electronically Signed   By: Lavonia Dana M.D.   On: 11/19/2019 13:33   DG Chest 2 View  Result Date: 12/11/2019 CLINICAL DATA:  Atrial fibrillation EXAM: CHEST - 2 VIEW COMPARISON:  December 06, 2019. FINDINGS: There is airspace consolidation in the left lower lobe with focal left pleural effusion. Lungs elsewhere are clear. Heart size and pulmonary vascularity are normal. Central catheter tip is in the right atrium slightly beyond the cavoatrial junction. Patient is status post coronary artery bypass grafting with left atrial appendage clamp. There is aortic atherosclerosis. No adenopathy. No bone lesions. There are surgical clips in the right axillary region. IMPRESSION: Left lower lobe airspace opacity concerning for combination of atelectasis and pneumonia with left pleural effusion. Lungs otherwise clear. Heart upper normal in size. Postoperative changes. Central catheter tip in right atrium  slightly beyond the cavoatrial junction. No pneumothorax. Aortic Atherosclerosis (ICD10-I70.0). Electronically Signed   By: Lowella Grip III M.D.   On: 12/11/2019 10:41   DG Chest 2 View  Result Date: 11/14/2019 CLINICAL DATA:  Shortness of breath. EXAM: CHEST - 2 VIEW COMPARISON:  October 13, 2019. FINDINGS: The heart size and mediastinal contours are within normal limits. No pneumothorax is noted. Left internal jugular dialysis catheter is noted. Status post coronary bypass graft. Mild left pleural effusion is noted with associated left basilar atelectasis or infiltrate. Minimal right pleural effusion is noted. The visualized skeletal structures are unremarkable. IMPRESSION: Mild left pleural effusion with associated left basilar atelectasis or infiltrate. Minimal right pleural effusion. Electronically Signed   By: Marijo Conception M.D.   On: 11/14/2019 11:34   DG Chest Port 1 View  Result Date: 12/06/2019 CLINICAL DATA:  Shortness of breath EXAM: PORTABLE CHEST 1 VIEW COMPARISON:  Four days ago FINDINGS: Cephalized blood flow, small pleural effusions, and Kerley lines. Stable heart size. There has been CABG and left atrial clipping. Dialysis catheter from the left tip at the upper right atrium. Artifact from EKG leads. IMPRESSION: CHF pattern. Electronically Signed   By: Monte Fantasia M.D.   On: 12/06/2019 05:24   DG Chest Port 1 View  Result Date: 12/02/2019 CLINICAL DATA:  Fever, cough. EXAM: PORTABLE CHEST 1 VIEW COMPARISON:  11/19/2019 chest radiograph and prior. FINDINGS: Tunneled left IJ CVC tip overlies the right atrium. Patchy bibasilar opacities. No pneumothorax. Trace left pleural effusion, unchanged. Postsurgical appearance of the cardiomediastinal silhouette.  Left lower chest pacing pads. No acute osseous abnormality. IMPRESSION: Patchy bibasilar opacities, atelectasis versus infection. Trace left pleural effusion, unchanged. Electronically Signed   By: Primitivo Gauze M.D.    On: 12/02/2019 12:02   US THORACENTESIS ASP PLEURAL SPACE W/IMG GUIDE  Result Date: 11/19/2019 INDICATION: LEFT pleural effusion EXAM: ULTRASOUND GUIDED THERAPEUTIC LEFT THORACENTESIS MEDICATIONS: None. COMPLICATIONS: None immediate. PROCEDURE: An ultrasound guided thoracentesis was thoroughly discussed with the patient and questions answered. The benefits, risks, alternatives and complications were also discussed. The patient understands and wishes to proceed with the procedure. Written consent was obtained. Ultrasound was performed to localize and mark an adequate pocket of fluid in the LEFT chest. The area was then prepped and draped in the normal sterile fashion. 1% Lidocaine was used for local anesthesia. Under ultrasound guidance a 8 French thoracentesis catheter was introduced. Initially no fluid was obtained. A 5 [place] Yueh catheter was then placed and thoracentesis was performed. The catheter was removed and a dressing applied. FINDINGS: A total of approximately 750 mL of yellow LEFT pleural fluid was removed. IMPRESSION: Successful ultrasound guided LEFT thoracentesis yielding 750 mL of pleural fluid. Electronically Signed   By: Lavonia Dana M.D.   On: 11/19/2019 13:31   Micro Results  Recent Results (from the past 240 hour(s))  Respiratory Panel by RT PCR (Flu A&B, Covid) - Nasopharyngeal Swab     Status: None   Collection Time: 12/02/19 11:18 AM   Specimen: Nasopharyngeal Swab  Result Value Ref Range Status   SARS Coronavirus 2 by RT PCR NEGATIVE NEGATIVE Final    Comment: (NOTE) SARS-CoV-2 target nucleic acids are NOT DETECTED.  The SARS-CoV-2 RNA is generally detectable in upper respiratoy specimens during the acute phase of infection. The lowest concentration of SARS-CoV-2 viral copies this assay can detect is 131 copies/mL. A negative result does not preclude SARS-Cov-2 infection and should not be used as the sole basis for treatment or other patient management decisions. A  negative result may occur with  improper specimen collection/handling, submission of specimen other than nasopharyngeal swab, presence of viral mutation(s) within the areas targeted by this assay, and inadequate number of viral copies (<131 copies/mL). A negative result must be combined with clinical observations, patient history, and epidemiological information. The expected result is Negative.  Fact Sheet for Patients:  PinkCheek.be  Fact Sheet for Healthcare Providers:  GravelBags.it  This test is no t yet approved or cleared by the [place] FDA and  has been authorized for detection and/or diagnosis of SARS-CoV-2 by FDA under an Emergency Use Authorization (EUA). This EUA will remain  in effect (meaning this test can be used) for the duration of the COVID-19 declaration under Section 564(b)(1) of the Act, 21 U.S.C. section 360bbb-3(b)(1), unless the authorization is terminated or revoked sooner.     Influenza A by PCR NEGATIVE NEGATIVE Final   Influenza B by PCR NEGATIVE NEGATIVE Final    Comment: (NOTE) The Xpert Xpress SARS-CoV-2/FLU/RSV assay is intended as an aid in  the diagnosis of influenza from Nasopharyngeal swab specimens and  should not be used as a sole basis for treatment. Nasal washings and  aspirates are unacceptable for Xpert Xpress SARS-CoV-2/FLU/RSV  testing.  Fact Sheet for Patients: PinkCheek.be  Fact Sheet for Healthcare Providers: GravelBags.it  This test is not yet approved or cleared by the [place] FDA and  has been authorized for detection and/or diagnosis of SARS-CoV-2 by  FDA under an Emergency Use Authorization (EUA). This EUA will  remain  in effect (meaning this test can be used) for the duration of the  Covid-19 declaration under Section 564(b)(1) of the Act, 21  U.S.C. section 360bbb-3(b)(1), unless the authorization  is  terminated or revoked. Performed at [facility], [address], [place], [place] [address]   Blood Culture (routine x 2)     Status: Abnormal   Collection Time: 12/02/19 11:47 AM   Specimen: Left Antecubital; Blood  Result Value Ref Range Status   Specimen Description   Final    LEFT ANTECUBITAL BOTTLES DRAWN AEROBIC AND ANAEROBIC Performed at [facility], [address], [place], [place] [address]    Special Requests   Final    Blood Culture adequate volume Performed at [facility], [address] [place], [place] [address]    Culture  Setup Time   Final    GRAM POSITIVE COCCI IN CLUSTERS RECOVERED FROM BOTH BOTTLES Gram Stain Report Called to,Read Back By and Verified With: REYNOLDS,C. AT 8127 ON 12/03/2019 BY BAUGHAM,M Performed at [place] ID to follow CRITICAL RESULT CALLED TO, READ BACK BY AND VERIFIED WITH: C REYNOLDS RN 5170 12/03/19 A BROWNING    Culture (A)  Final    STAPHYLOCOCCUS EPIDERMIDIS THE SIGNIFICANCE OF ISOLATING THIS ORGANISM FROM A SINGLE SET OF BLOOD CULTURES WHEN MULTIPLE SETS ARE DRAWN IS UNCERTAIN. PLEASE NOTIFY THE MICROBIOLOGY DEPARTMENT WITHIN ONE WEEK IF SPECIATION AND SENSITIVITIES ARE REQUIRED. Performed at [facility], [place] [address], [place],  [address]    Report Status 12/06/2019 FINAL  Final  Blood Culture ID Panel (Reflexed)     Status: Abnormal   Collection Time: 12/02/19 11:47 AM  Result Value Ref Range Status   Enterococcus faecalis NOT DETECTED NOT DETECTED Final   Enterococcus Faecium NOT DETECTED NOT DETECTED Final   Listeria monocytogenes NOT DETECTED NOT DETECTED Final   Staphylococcus species DETECTED (A) NOT DETECTED Final    Comment: CRITICAL RESULT CALLED TO, READ BACK BY AND VERIFIED WITHAleen Sells RN 1717 12/03/19 A BROWNING    Staphylococcus aureus (BCID) NOT DETECTED NOT DETECTED Final   Staphylococcus epidermidis DETECTED (A) NOT DETECTED Final    Comment: Methicillin (oxacillin)  resistant coagulase negative staphylococcus. Possible blood culture contaminant (unless isolated from more than one blood culture draw or clinical case suggests pathogenicity). No antibiotic treatment is indicated for blood  culture contaminants. CRITICAL RESULT CALLED TO, READ BACK BY AND VERIFIED WITH: Aleen Sells RN [phone] 12/03/19 A BROWNING    Staphylococcus lugdunensis NOT DETECTED NOT DETECTED Final   Streptococcus species NOT DETECTED NOT DETECTED Final   Streptococcus agalactiae NOT DETECTED NOT DETECTED Final   Streptococcus pneumoniae NOT DETECTED NOT DETECTED Final   Streptococcus pyogenes NOT DETECTED NOT DETECTED Final   A.calcoaceticus-baumannii NOT DETECTED NOT DETECTED Final   Bacteroides fragilis NOT DETECTED NOT DETECTED Final   Enterobacterales NOT DETECTED NOT DETECTED Final   Enterobacter cloacae complex NOT DETECTED NOT DETECTED Final   Escherichia coli NOT DETECTED NOT DETECTED Final   Klebsiella aerogenes NOT DETECTED NOT DETECTED Final   Klebsiella oxytoca NOT DETECTED NOT DETECTED Final   Klebsiella pneumoniae NOT DETECTED NOT DETECTED Final   Proteus species NOT DETECTED NOT DETECTED Final   Salmonella species NOT DETECTED NOT DETECTED Final   Serratia marcescens NOT DETECTED NOT DETECTED Final   Haemophilus influenzae NOT DETECTED NOT DETECTED Final   Neisseria meningitidis NOT DETECTED NOT DETECTED Final   Pseudomonas aeruginosa NOT DETECTED NOT DETECTED Final   Stenotrophomonas  maltophilia NOT DETECTED NOT DETECTED Final   Candida albicans NOT DETECTED NOT DETECTED Final   Candida auris NOT DETECTED NOT DETECTED Final   Candida glabrata NOT DETECTED NOT DETECTED Final   Candida krusei NOT DETECTED NOT DETECTED Final   Candida parapsilosis NOT DETECTED NOT DETECTED Final   Candida tropicalis NOT DETECTED NOT DETECTED Final   Cryptococcus neoformans/gattii NOT DETECTED NOT DETECTED Final   Methicillin resistance mecA/C DETECTED (A) NOT DETECTED Final     Comment: CRITICAL RESULT CALLED TO, READ BACK BY AND VERIFIED WITHAleen Sells RN 1717 12/03/19 A BROWNING Performed at [facility] [address], [place], [place] [address]   Blood Culture (routine x 2)     Status: None   Collection Time: 12/02/19 11:48 AM   Specimen: BLOOD LEFT HAND  Result Value Ref Range Status   Specimen Description BLOOD LEFT HAND AEROBIC BOTTLE ONLY  Final   Special Requests   Final    Blood Culture results may not be optimal due to an inadequate volume of blood received in culture bottles   Culture   Final    NO GROWTH 5 DAYS Performed at [facility], [address] [place], [place] [address]    Report Status 12/07/2019 FINAL  Final  MRSA PCR Screening     Status: None   Collection Time: 12/03/19  9:10 AM   Specimen: Nasopharyngeal  Result Value Ref Range Status   MRSA by PCR NEGATIVE NEGATIVE Final    Comment:        The GeneXpert MRSA Assay (FDA approved for NASAL specimens only), is one component of a comprehensive MRSA colonization surveillance program. It is not intended to diagnose MRSA infection nor to guide or monitor treatment for MRSA infections. Performed at [facility], [address] [place], [place] [address]   Culture, blood (Routine X 2) w Reflex to ID Panel     Status: None   Collection Time: 12/03/19  8:00 PM   Specimen: BLOOD RIGHT HAND  Result Value Ref Range Status   Specimen Description BLOOD RIGHT HAND  Final   Special Requests   Final    BOTTLES DRAWN AEROBIC AND ANAEROBIC Blood Culture adequate volume   Culture   Final    NO GROWTH 5 DAYS Performed at [facility], [address] [place], [place] [address]    Report Status 12/08/2019 FINAL  Final  Culture, blood (Routine X 2) w Reflex to ID Panel     Status: None   Collection Time: 12/03/19  8:03 PM   Specimen: BLOOD LEFT HAND  Result Value Ref Range Status   Specimen Description BLOOD LEFT HAND  Final   Special Requests   Final    BOTTLES DRAWN AEROBIC  ONLY Blood Culture adequate volume   Culture   Final    NO GROWTH 5 DAYS Performed at [facility], [address], [place], [place] [address]    Report Status 12/08/2019 FINAL  Final  Culture, blood (routine x 2)     Status: None   Collection Time: 12/06/19  5:24 AM   Specimen: BLOOD  Result Value Ref Range Status   Specimen Description BLOOD BLOOD LEFT HAND  Final   Special Requests   Final    BOTTLES DRAWN AEROBIC ONLY Blood Culture results may not be optimal due to an inadequate volume of blood received in culture bottles   Culture   Final    NO GROWTH 5 DAYS Performed at [facility], [address], [place], [place] [address]    Report Status 12/11/2019 FINAL  Final  Culture, blood (routine x 2)     Status: None   Collection Time: 12/06/19  5:31 AM   Specimen: BLOOD  Result Value Ref Range Status   Specimen Description BLOOD BLOOD LEFT HAND  Final   Special Requests   Final    BOTTLES DRAWN AEROBIC ONLY Blood Culture results may not be optimal due to an inadequate volume of blood received in culture bottles   Culture   Final    NO GROWTH 5 DAYS Performed at [facility], [address] [place] [address], [place], [place] [address]    Report Status 12/11/2019 FINAL  Final  Respiratory Panel by RT PCR (Flu A&B, Covid) - Nasopharyngeal Swab     Status: None   Collection Time: 12/11/19  9:58 AM   Specimen: Nasopharyngeal Swab  Result Value Ref Range Status   SARS Coronavirus 2 by RT PCR NEGATIVE NEGATIVE Final    Comment: (NOTE) SARS-CoV-2 target nucleic acids are NOT DETECTED.  The SARS-CoV-2 RNA is generally detectable in upper respiratoy specimens during the acute phase of infection. The lowest concentration of SARS-CoV-2 viral copies this assay can detect is 131 copies/mL. A negative result does not preclude SARS-Cov-2 infection and should not be used as the sole basis for treatment or other patient management decisions. A negative result may occur with  improper specimen  collection/handling, submission of specimen other than nasopharyngeal swab, presence of viral mutation(s) within the areas targeted by this assay, and inadequate number of viral copies (<131 copies/mL). A negative result must be combined with clinical observations, patient history, and epidemiological information. The expected result is Negative.  Fact Sheet for Patients:  PinkCheek.be  Fact Sheet for Healthcare Providers:  GravelBags.it  This test is no t yet approved or cleared by the [place] FDA and  has been authorized for detection and/or diagnosis of SARS-CoV-2 by FDA under an Emergency Use Authorization (EUA). This EUA will remain  in effect (meaning this test can be used) for the duration of the COVID-19 declaration under Section 564(b)(1) of the Act, 21 U.S.C. section 360bbb-3(b)(1), unless the authorization is terminated or revoked sooner.     Influenza A by PCR NEGATIVE NEGATIVE Final   Influenza B by PCR NEGATIVE NEGATIVE Final    Comment: (NOTE) The Xpert Xpress SARS-CoV-2/FLU/RSV assay is intended as an aid in  the diagnosis of influenza from Nasopharyngeal swab specimens and  should not be used as a sole basis for treatment. Nasal washings and  aspirates are unacceptable for Xpert Xpress SARS-CoV-2/FLU/RSV  testing.  Fact Sheet for Patients: PinkCheek.be  Fact Sheet for Healthcare Providers: GravelBags.it  This test is not yet approved or cleared by the [place] FDA and  has been authorized for detection and/or diagnosis of SARS-CoV-2 by  FDA under an Emergency Use Authorization (EUA). This EUA will remain  in effect (meaning this test can be used) for the duration of the  Covid-19 declaration under Section 564(b)(1) of the Act, 21  U.S.C. section 360bbb-3(b)(1), unless the authorization is  terminated or revoked. Performed at [facility], [address] [place], [place] [address]        Today   Subjective    Jesse Murphy today has no new complaints -Remains in sinus rhythm No nausea, vomiting, diarrhea  No fever  Or chills   -I called and updated patient's son prior to discharge, questions answered   Patient has  been seen and examined prior to discharge   Objective   Blood pressure (!) 129/59, pulse 93, temperature 97.6 F (36.4 C), temperature source Oral, resp. rate 20, height 5\' 8"  (1.727 m), weight 64 kg, SpO2 100 %.   Intake/Output Summary (Last 24 hours) at 12/12/2019 1113 Last data filed at 12/12/2019 0300 Gross per 24 hour  Intake 510 ml  Output --  Net 510 ml    Exam Physical Examination: General appearance - alert, chronically ill appearing, and in no distress  Mental status - alert, oriented to person, place, and time,  Eyes - sclera anicteric Neck - supple, no JVD elevation , left IJ hemodialysis catheter Chest -diminished breath sounds , no wheezing Heart - S1 and S2 normal, regular Abdomen - soft, nontender, nondistended,   Neurological - -Generalized weakness without new focal deficits Extremities - no significant pedal edema noted, intact peripheral pulses  Skin - warm, dry, bilateral heels and sacral decubitus    Data Review   CBC w Diff:  Lab Results  Component Value Date   WBC 7.7 12/12/2019   HGB 9.1 (L) 12/12/2019   HCT 31.3 (L) 12/12/2019   PLT 251 12/12/2019   LYMPHOPCT 9 12/11/2019   MONOPCT 6 12/11/2019   EOSPCT 0 12/11/2019   BASOPCT 0 12/11/2019    CMP:  Lab Results  Component Value Date   NA 135 12/12/2019   K 4.9 12/12/2019   CL 95 (L) 12/12/2019   CO2 27 12/12/2019   BUN 45 (H) 12/12/2019   CREATININE 4.60 (H) 12/12/2019   PROT 5.3 (L) 12/12/2019   ALBUMIN 2.6 (L) 12/12/2019   BILITOT 0.7 12/12/2019   ALKPHOS 58 12/12/2019   AST 15 12/12/2019   ALT 13 12/12/2019  .  Total Discharge time is about 33 minutes  Roxan Hockey M.D on  12/12/2019 at 11:13 AM  Go to [email or web address] -  for contact info  [facility] - Office  [phone]

## 2019-12-12 NOTE — TOC Progression Note (Signed)
Pt stable for dc today per MD. MD has updated pt's son. Plan remains for return to [facility]. Spoke with [facility] Poling and they will accept back today. Pt does not need a new FL2 per Jackelyn Poling since he was observation status here. RN to call report to [phone]. Will arrange EMS when pt ready. DC clinical sent electronically. There are no other TOC needs for dc.

## 2019-12-25 ENCOUNTER — Other Ambulatory Visit: Payer: Self-pay | Admitting: Cardiothoracic Surgery

## 2019-12-25 DIAGNOSIS — Z8709 Personal history of other diseases of the respiratory system: Secondary | ICD-10-CM

## 2019-12-26 ENCOUNTER — Ambulatory Visit (INDEPENDENT_AMBULATORY_CARE_PROVIDER_SITE_OTHER): Payer: Medicare Other | Admitting: Cardiothoracic Surgery

## 2019-12-26 ENCOUNTER — Ambulatory Visit
Admission: RE | Admit: 2019-12-26 | Discharge: 2019-12-26 | Disposition: A | Discharge status: Home / Self Care | Payer: Medicare Other | Source: Ambulatory Visit | Attending: Cardiothoracic Surgery | Admitting: Cardiothoracic Surgery

## 2019-12-26 ENCOUNTER — Other Ambulatory Visit: Payer: Self-pay

## 2019-12-26 ENCOUNTER — Encounter: Payer: Self-pay | Admitting: Cardiothoracic Surgery

## 2019-12-26 VITALS — BP 101/50 | HR 75 | Resp 18 | Ht 68.0 in

## 2019-12-26 DIAGNOSIS — Z951 Presence of aortocoronary bypass graft: Secondary | ICD-10-CM | POA: Diagnosis not present

## 2019-12-26 DIAGNOSIS — Z8709 Personal history of other diseases of the respiratory system: Secondary | ICD-10-CM | POA: Diagnosis not present

## 2019-12-26 DIAGNOSIS — I2510 Atherosclerotic heart disease of native coronary artery without angina pectoris: Secondary | ICD-10-CM | POA: Diagnosis not present

## 2019-12-26 NOTE — Progress Notes (Signed)
PCP is Leeanne Rio, MD Referring Provider is Leeanne Rio, MD  Chief Complaint  Patient presents with  . Routine Post Op    1 month f/u with CXR    HPI: Patient returns with chest x-ray for follow-up of a post CABG left pleural effusion.  The patient required a Pleurx catheter following surgery but that was removed earlier this year.  We have been following him with a surveillance chest x-ray periodically to make sure the fluid is not returned.  Patient has evidence of diastolic heart failure and is on hemodialysis.  He denies any shortness of breath.  I reviewed the chest x-ray performed today personally and that shows only a very small left pleural effusion.  Patient has multiple health issues including postoperative atrial fibrillation for which he required a short hospital stay at [facility] for amiodarone therapy to control rate.  He cannot tolerate anticoagulants because of radiation cystitis and hematuria with clots.  The patient was recently diagnosed with pemphigus vulgaris And is on a prednisone taper as directed by a dermatologist.  Patient is extremely weak and cannot stand or ambulate without assistance and is receiving home physical therapy  Past Medical History:  Diagnosis Date  . Anemia   . CAD (coronary artery disease)    a. s/p PCI in 1992 b. Coronary CT in 01/2018 showing extensive coronary calcification; FFR indeterminate --> medical management pursued at that time as patient not interested in repeat cath. c. s/p CABGx3 in 02/2019.  . Cancer [facility])    prostate  . Cardiogenic shock ([place])   . CHB (complete heart block) (HCC)    a. due to vagal event in 2021, requiring temp pacer.  . Chronic combined systolic and diastolic CHF (congestive heart failure) ([place])   . Debilitated   . Decubitus ulcer of coccyx, unspecified pressure ulcer stage   . Dyspnea   . End stage renal disease ([place])   . Esophagitis   . H/O tracheostomy    a. during admit in 2021 for  respiratory failure.  . Hematuria   . Hypertension   . Hypoalbuminemia   . Ischemic cardiomyopathy   . Myocardial infarction ([place])   . PAF (paroxysmal atrial fibrillation) ([place])   . 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 Pleural effusion, left    a. s/p pleurX catheter  . Pressure ulcer of both heels   . Pseudomonas pneumonia ([place])   . Thrombocytopenia ([place])   . Walker as ambulation aid    also uses wheelchair    Past Surgical History:  Procedure Laterality Date  . BASCILIC VEIN TRANSPOSITION Right 09/10/2019   Procedure: RIGHT ARM 1ST STAGE BASCILIC VEIN TRANSPOSITION;  Surgeon: Angelia Mould, MD;  Location: [facility];  Service: Vascular;  Laterality: Right;  . BASCILIC VEIN TRANSPOSITION Right 11/06/2019   Procedure: RIGHT UPPER EXTREMITY SECOND STAGE BASCILIC VEIN TRANSPOSITION;  Surgeon: Angelia Mould, MD;  Location: [facility];  Service: Vascular;  Laterality: Right;  . BIOPSY  10/15/2019   Procedure: BIOPSY;  Surgeon: Daneil Dolin, MD;  [facility];  Service: Endoscopy;;  Distal Rectal Ulceration biopsies   . CHEST TUBE INSERTION Left 04/23/2019   Procedure: INSERTION PLEURAL DRAINAGE CATHETER TO DRAIN LEFT PLEURAL EFFUSION;  Surgeon: Ivin Poot, MD;  Location: [facility];  Service: Thoracic;  Laterality: Left;  . CLIPPING OF ATRIAL APPENDAGE N/A 03/16/2019   Procedure: CLIPPING OF LEFT  ATRIAL APPENDAGE - USING ATRICLIP SIZE 40;  Surgeon: Ivin Poot,  MD;  [facility];  Service: Open Heart Surgery;  Laterality: N/A;  . COLONOSCOPY N/A 08/25/2017   Procedure: COLONOSCOPY;  Surgeon: Rogene Houston, MD;  [facility];  Service: Endoscopy;  Laterality: N/A;  . CORONARY ARTERY BYPASS GRAFT N/A 03/16/2019   Procedure: CORONARY ARTERY BYPASS GRAFTING (CABG), ON PUMP, TIMES THREE, USING LEFT INTERNAL MAMMARY ARTERY, RIGHT GREATER SAPHENOUS VEIN HARVESTED ENDOSCOPICALLY;  Surgeon: Ivin Poot, MD;  Location: [facility];  Service: Open Heart Surgery;   Laterality: N/A;  swan only  . CYSTOSCOPY WITH FULGERATION N/A 04/30/2019   Procedure: CYSTOSCOPY WITH FULGERATION;  Surgeon: Irine Seal, MD;  Location: [facility];  Service: Urology;  Laterality: N/A;  . FLEXIBLE SIGMOIDOSCOPY N/A 10/15/2019   Procedure: FLEXIBLE SIGMOIDOSCOPY with propofol;  Surgeon: Daneil Dolin, MD;  [facility];  Service: Endoscopy;  Laterality: N/A;  . FLEXIBLE SIGMOIDOSCOPY N/A 11/24/2019   Procedure: FLEXIBLE SIGMOIDOSCOPY;  Surgeon: Harvel Quale, MD;  [facility];  Service: Gastroenterology;  Laterality: N/A;  . HERNIA REPAIR    . IR FLUORO GUIDE CV LINE LEFT  04/23/2019  . IR FLUORO GUIDE CV LINE RIGHT  05/24/2019  . IR US GUIDE VASC ACCESS LEFT  04/23/2019  . MAZE N/A 03/16/2019   Procedure: MAZE;  Surgeon: Ivin Poot, MD;  Location: [place];  Service: Open Heart Surgery;  Laterality: N/A;  . PLACEMENT OF IMPELLA LEFT VENTRICULAR ASSIST DEVICE Right 03/27/2019   Procedure: Placement Of Impella Left Ventricular Assist Device using ABIOMED Impella 5.5 with SmartAssist Device.;  Surgeon: Wonda Olds, MD;  [facility];  Service: Thoracic;  Laterality: Right;  . POLYPECTOMY  11/24/2019   Procedure: POLYPECTOMY;  Surgeon: Harvel Quale, MD;  [facility];  Service: Gastroenterology;;  sigmoid  . PROSTATE SURGERY    . REMOVAL OF IMPELLA LEFT VENTRICULAR ASSIST DEVICE N/A 04/10/2019   Procedure: REMOVAL OF IMPELLA LEFT VENTRICULAR ASSIST DEVICE WITH INSERTION OF RIGHT FEMORAL ARTERIAL LINE;  Surgeon: Ivin Poot, MD;  Location: [facility];  Service: Open Heart Surgery;  Laterality: N/A;  . REMOVAL OF PLEURAL DRAINAGE CATHETER Left 07/02/2019   Procedure: REMOVAL OF PLEURAL DRAINAGE CATHETER;  Surgeon: Ivin Poot, MD;  Location: [facility];  Service: Thoracic;  Laterality: Left;  . RIGHT/LEFT HEART CATH AND CORONARY ANGIOGRAPHY N/A 03/15/2019   Procedure: RIGHT/LEFT HEART CATH AND CORONARY ANGIOGRAPHY;  Surgeon:  Burnell Blanks, MD;  Location: [facility];  Service: Cardiovascular;  Laterality: N/A;  . TEE WITHOUT CARDIOVERSION N/A 03/16/2019   Procedure: TRANSESOPHAGEAL ECHOCARDIOGRAM (TEE);  Surgeon: Prescott Gum, Collier Salina, MD;  Location: [facility];  Service: Open Heart Surgery;  Laterality: N/A;  . TRACHEOSTOMY TUBE PLACEMENT N/A 03/27/2019   Procedure: TRACHEOSTOMY placed using Shiley 8DCT Cuffed.;  Surgeon: Wonda Olds, MD;  [facility];  Service: Thoracic;  Laterality: N/A;  . TRANSURETHRAL RESECTION OF BLADDER NECK N/A 05/13/2019   Procedure: CYSTOSCOPY CLOT EVACUATION FULGRATION CYSTOGRAM AND INSTILLATION OF FORMALIN;  Surgeon: Lucas Mallow, MD;  Location: [facility];  Service: Urology;  Laterality: N/A;    Family History  Problem Relation Age of Onset  . CAD Mother 62  . CAD Father 15  . CAD Sister   . CAD Brother     Social History Social History   Tobacco Use  . Smoking status: Former Smoker    Packs/day: 0.25    Years: 50.00    Pack years: 12.50  Types: Cigarettes    Quit date: 02/15/1990    Years since quitting: 29.8  . Smokeless tobacco: Never Used  Vaping Use  . Vaping Use: Never used  Substance Use Topics  . Alcohol use: No  . Drug use: No    Current Outpatient Medications  Medication Sig Dispense Refill  . acetaminophen (TYLENOL) 500 MG tablet Take 500 mg by mouth every 6 (six) hours as needed for moderate pain.    Marland Kitchen amiodarone (PACERONE) 200 MG tablet Take 1 tablet (200 mg total) by mouth See admin instructions. oral amiodarone 200mg  bid x 4 weeks, then 200mg  daily after that 90 tablet 2  . aspirin 81 MG chewable tablet Chew 81 mg by mouth in the morning.     . Calcium Carb-Cholecalciferol (CALCIUM CARBONATE-VITAMIN D3) 600-400 MG-UNIT TABS Take 1 tablet by mouth daily.    . carboxymethylcellulose (REFRESH PLUS) 0.5 % SOLN Place 1 drop into both eyes 4 (four) times daily.    . Ensure Plus (ENSURE PLUS) LIQD Take 237 mLs by mouth 2 (two) times daily  between meals.    . midodrine (PROAMATINE) 10 MG tablet Take 10 mg by mouth 2 (two) times daily.     . Nutritional Supplements (FEEDING SUPPLEMENT, NEPRO CARB STEADY,) LIQD Take 237 mLs by mouth at bedtime.    Marland Kitchen nystatin ointment (MYCOSTATIN) Apply 1 application topically daily as needed (Apply to Penis as needed if itching occurs.).     Marland Kitchen oxyCODONE-acetaminophen (PERCOCET/ROXICET) 5-325 MG tablet Take 1 tablet by mouth every 4 (four) hours as needed for severe pain. 10 tablet 0  . pantoprazole (PROTONIX) 40 MG tablet Take 1 tablet (40 mg total) by mouth daily. 30 tablet 4  . polyethylene glycol (MIRALAX / GLYCOLAX) 17 g packet Take 17 g by mouth 2 (two) times daily. 60 each 4  . [START ON 02/11/2020] predniSONE (DELTASONE) 10 MG tablet Take 40 mg by mouth daily.     . predniSONE (DELTASONE) 10 MG tablet Take 50 mg by mouth daily with breakfast.    . senna-docusate (SENOKOT-S) 8.6-50 MG tablet Take 2 tablets by mouth 2 (two) times daily. 120 tablet 3  . triamcinolone cream (KENALOG) 0.1 % Apply 1 application topically 2 (two) times daily. Apply to skin that is intact. DO NOT apply to "burn wounds".     No current facility-administered medications for this visit.    Allergies  Allergen Reactions  . Atorvastatin Anaphylaxis and Rash    Leg pain  . Adhesive [Tape]     Tears skin  . Reglan [Metoclopramide]     Rash (see 05/2019 hospital notes), but was on other medications at the time so unclear which agent  . Heparin Rash    Thrombocytopenia/ HIT    Review of Systems  No symptoms or signs of infection Appetite and strength have improved on prednisone Sternal incision well-healed. Receiving dialysis through AV fistula in the right upper arm.  BP (!) 101/50 (BP Location: Left Arm, Patient Position: Sitting)   Pulse 75   Resp 18   Ht 5\' 8"  (1.727 m)   SpO2 96% Comment: RA  BMI 21.39 kg/m  Physical Exam      Exam    General- alert and comfortable, elderly frail-appearing male in a  wheelchair    Neck- no JVD, no cervical adenopathy palpable, no carotid bruit   Lungs- clear without rales, wheezes   Cor- irregular rate and rhythm, no murmur , gallop   Abdomen- soft, non-tender   Extremities -  warm, non-tender, minimal edema   Neuro- oriented, appropriate, no focal weakness Skin-good resolution of the pemphigus vulgaris intradermal blistering  Diagnostic Tests: Chest x-ray performed today personally viewed results as noted above.  No significant left pleural effusion  Impression: Patient slowly improving.  The left pleural effusion appears to have stabilized and will not need a tap or Pleurx catheter.  Plan: I will see the patient back after the first year with chest x-ray 1 more time to assess for recurrence of left effusion.  Len Childs, MD [facility] [phone]

## 2019-12-27 ENCOUNTER — Ambulatory Visit: Payer: Medicare Other | Admitting: Cardiology

## 2020-01-23 ENCOUNTER — Emergency Department (HOSPITAL_COMMUNITY)
Admission: EM | Admit: 2020-01-23 | Discharge: 2020-01-23 | Disposition: A | Discharge status: Home / Self Care | Payer: Medicare Other | Source: Home / Self Care | Attending: Emergency Medicine | Admitting: Emergency Medicine

## 2020-01-23 ENCOUNTER — Encounter (HOSPITAL_COMMUNITY): Payer: Self-pay | Admitting: Emergency Medicine

## 2020-01-23 ENCOUNTER — Other Ambulatory Visit: Payer: Self-pay

## 2020-01-23 ENCOUNTER — Emergency Department (HOSPITAL_COMMUNITY): Payer: Medicare Other

## 2020-01-23 DIAGNOSIS — E039 Hypothyroidism, unspecified: Secondary | ICD-10-CM | POA: Insufficient documentation

## 2020-01-23 DIAGNOSIS — Z992 Dependence on renal dialysis: Secondary | ICD-10-CM

## 2020-01-23 DIAGNOSIS — Z7982 Long term (current) use of aspirin: Secondary | ICD-10-CM | POA: Insufficient documentation

## 2020-01-23 DIAGNOSIS — E119 Type 2 diabetes mellitus without complications: Secondary | ICD-10-CM | POA: Insufficient documentation

## 2020-01-23 DIAGNOSIS — I482 Chronic atrial fibrillation, unspecified: Secondary | ICD-10-CM | POA: Insufficient documentation

## 2020-01-23 DIAGNOSIS — D539 Nutritional anemia, unspecified: Secondary | ICD-10-CM | POA: Insufficient documentation

## 2020-01-23 DIAGNOSIS — Z8572 Personal history of non-Hodgkin lymphomas: Secondary | ICD-10-CM | POA: Insufficient documentation

## 2020-01-23 DIAGNOSIS — I2510 Atherosclerotic heart disease of native coronary artery without angina pectoris: Secondary | ICD-10-CM | POA: Insufficient documentation

## 2020-01-23 DIAGNOSIS — I132 Hypertensive heart and chronic kidney disease with heart failure and with stage 5 chronic kidney disease, or end stage renal disease: Secondary | ICD-10-CM | POA: Insufficient documentation

## 2020-01-23 DIAGNOSIS — J9691 Respiratory failure, unspecified with hypoxia: Secondary | ICD-10-CM | POA: Diagnosis not present

## 2020-01-23 DIAGNOSIS — Z8546 Personal history of malignant neoplasm of prostate: Secondary | ICD-10-CM | POA: Insufficient documentation

## 2020-01-23 DIAGNOSIS — Z7901 Long term (current) use of anticoagulants: Secondary | ICD-10-CM

## 2020-01-23 DIAGNOSIS — N186 End stage renal disease: Secondary | ICD-10-CM

## 2020-01-23 DIAGNOSIS — E876 Hypokalemia: Secondary | ICD-10-CM | POA: Insufficient documentation

## 2020-01-23 DIAGNOSIS — Z79899 Other long term (current) drug therapy: Secondary | ICD-10-CM | POA: Insufficient documentation

## 2020-01-23 DIAGNOSIS — I5043 Acute on chronic combined systolic (congestive) and diastolic (congestive) heart failure: Secondary | ICD-10-CM | POA: Insufficient documentation

## 2020-01-23 DIAGNOSIS — R0602 Shortness of breath: Secondary | ICD-10-CM

## 2020-01-23 DIAGNOSIS — Z87891 Personal history of nicotine dependence: Secondary | ICD-10-CM | POA: Insufficient documentation

## 2020-01-23 DIAGNOSIS — U071 COVID-19: Secondary | ICD-10-CM | POA: Diagnosis not present

## 2020-01-23 LAB — CBC WITH DIFFERENTIAL/PLATELET
Abs Immature Granulocytes: 0.16 10*3/uL — ABNORMAL HIGH (ref 0.00–0.07)
Basophils Absolute: 0 10*3/uL (ref 0.0–0.1)
Basophils Relative: 0 %
Eosinophils Absolute: 0 10*3/uL (ref 0.0–0.5)
Eosinophils Relative: 0 %
HCT: 33.7 % — ABNORMAL LOW (ref 39.0–52.0)
Hemoglobin: 9.8 g/dL — ABNORMAL LOW (ref 13.0–17.0)
Immature Granulocytes: 3 %
Lymphocytes Relative: 2 %
Lymphs Abs: 0.1 10*3/uL — ABNORMAL LOW (ref 0.7–4.0)
MCH: 31.9 pg (ref 26.0–34.0)
MCHC: 29.1 g/dL — ABNORMAL LOW (ref 30.0–36.0)
MCV: 109.8 fL — ABNORMAL HIGH (ref 80.0–100.0)
Monocytes Absolute: 0.3 10*3/uL (ref 0.1–1.0)
Monocytes Relative: 5 %
Neutro Abs: 5.1 10*3/uL (ref 1.7–7.7)
Neutrophils Relative %: 90 %
Platelets: 90 10*3/uL — ABNORMAL LOW (ref 150–400)
RBC: 3.07 MIL/uL — ABNORMAL LOW (ref 4.22–5.81)
RDW: 20.6 % — ABNORMAL HIGH (ref 11.5–15.5)
WBC: 5.7 10*3/uL (ref 4.0–10.5)
nRBC: 4 % — ABNORMAL HIGH (ref 0.0–0.2)

## 2020-01-23 LAB — BASIC METABOLIC PANEL
Anion gap: 12 (ref 5–15)
BUN: 22 mg/dL (ref 8–23)
CO2: 31 mmol/L (ref 22–32)
Calcium: 7.9 mg/dL — ABNORMAL LOW (ref 8.9–10.3)
Chloride: 92 mmol/L — ABNORMAL LOW (ref 98–111)
Creatinine, Ser: 2.13 mg/dL — ABNORMAL HIGH (ref 0.61–1.24)
GFR, Estimated: 29 mL/min — ABNORMAL LOW (ref >60)
Glucose, Bld: 117 mg/dL — ABNORMAL HIGH (ref 70–99)
Potassium: 3.1 mmol/L — ABNORMAL LOW (ref 3.5–5.1)
Sodium: 135 mmol/L (ref 135–145)

## 2020-01-23 MED ORDER — POTASSIUM CHLORIDE CRYS ER 20 MEQ PO TBCR
40.0000 meq | EXTENDED_RELEASE_TABLET | Freq: Once | ORAL | Status: AC
  Administered 2020-01-23: 40 meq via ORAL
  Filled 2020-01-23: qty 2

## 2020-01-23 NOTE — ED Triage Notes (Signed)
Pt was receiving dialysis and became sob. When ems arrived pt was in afib with rvr, but pt transitioned on his own.

## 2020-01-23 NOTE — ED Notes (Signed)
X-ray at bedside

## 2020-01-23 NOTE — ED Provider Notes (Signed)
[facility] EMERGENCY DEPARTMENT Provider Note   CSN: [id number] Arrival date & time: 01/23/20  2037   History Chief Complaint  Patient presents with  . Shortness of Breath    Jesse Murphy is a 86 y.o. male.  The history is provided by the patient.  Shortness of Breath He has history of hypertension, diabetes, pemphigus vulgaris, paroxysmal atrial fibrillation, end-stage renal disease on hemodialysis and was brought in by ambulance because he became very short of breath during dialysis.  EMS noted he was in atrial fibrillation with rapid ventricular response, but heart rate dropped in route and patient states that he is feeling better.  He denies any chest pain, heaviness, tightness, pressure.  Denies any cough.  Of note, he was diagnosed with COVID-19 3 days ago.  He has been completely immunized against COVID-19.  He denies fever or chills and he denies loss of sense of smell or taste.  Past Medical History:  Diagnosis Date  . Anemia   . CAD (coronary artery disease)    a. s/p PCI in 1992 b. Coronary CT in 01/2018 showing extensive coronary calcification; FFR indeterminate --> medical management pursued at that time as patient not interested in repeat cath. c. s/p CABGx3 in 02/2019.  . Cancer [facility])    prostate  . Cardiogenic shock ([place])   . CHB (complete heart block) (HCC)    a. due to vagal event in 2021, requiring temp pacer.  . Chronic combined systolic and diastolic CHF (congestive heart failure) ([place])   . Debilitated   . Decubitus ulcer of coccyx, unspecified pressure ulcer stage   . Dyspnea   . End stage renal disease ([place])   . Esophagitis   . H/O tracheostomy    a. during admit in 2021 for respiratory failure.  . Hematuria   . Hypertension   . Hypoalbuminemia   . Ischemic cardiomyopathy   . Myocardial infarction ([place])   . PAF (paroxysmal atrial fibrillation) ([place])   . 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 Pleural effusion, left    a. s/p pleurX catheter  . Pressure  ulcer of both heels   . Pseudomonas pneumonia ([place])   . Thrombocytopenia ([place])   . Walker as ambulation aid    also uses wheelchair    Patient Active Problem List   Diagnosis Date Noted  . Tachycardia 12/11/2019  . Multifocal atrial tachycardia ([place]) 12/11/2019  . HCAP (healthcare-associated pneumonia) 12/02/2019  . Ischemic cardiomyopathy   . History of Pseudomonas pneumonia ([place])   . Stercoral ulcer of rectum   . DNR (do not resuscitate)   . Systolic and diastolic CHF, chronic ([place]) 11/24/2019  . Acute blood loss anemia 11/23/2019  . Adverse effect of antihyperlipidemic and antiarteriosclerotic drugs, initial encounter 11/07/2019  . Pure hypercholesterolemia 11/07/2019  . Skin desquamation 11/07/2019  . Gastrointestinal hemorrhage, unspecified 10/18/2019  . Advanced care planning/counseling discussion   . End stage renal disease on dialysis ([place])   . Palliative care by specialist   . Goals of care, counseling/discussion   . Rectal bleeding 10/13/2019  . Inguinal hernia of left side without obstruction or gangrene   . Pemphigus vulgaris-biopsy confirmed in August 2021 10/10/2019  . 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 History of atrial fibrillation- treated surgically, no longer has it per the patient 10/01/2019  . Personal history of prostate cancer 10/01/2019  . Abnormality of gait 09/24/2019  . Hypothyroidism, unspecified 09/19/2019  . Anaphylactic shock, unspecified, initial  encounter 09/05/2019  . History of pleural effusion 08/22/2019  . Recurrent left pleural effusion 07/11/2019  . Hematuria 07/09/2019  . Postop check 06/27/2019  . Unspecified protein-calorie malnutrition ([place]) 06/22/2019  . Coagulation defect, unspecified ([place]) 06/19/2019  . AKI (acute kidney injury) ([place]) 06/18/2019  . Anemia in chronic kidney disease 06/14/2019  . Cardiogenic shock ([place]) 06/14/2019  . History of chest tube  placement 06/14/2019  . Diarrhea, unspecified 06/14/2019  . Iron deficiency anemia, unspecified 06/14/2019  . Pain, unspecified 06/14/2019  . Pruritus, unspecified 06/14/2019  . Secondary hyperparathyroidism of renal origin ([place]) 06/14/2019  . Adult failure to thrive   . History of non-Hodgkin's lymphoma   . Chest tube in place   . Failure to thrive (child)   . Prediabetes   . Hypoalbuminemia due to protein-calorie malnutrition ([place])   . Failure to thrive in adult   . Allergic contact dermatitis   . ESRD on dialysis ([place])   . Dysphagia   . Thrombocytopenia ([place])   . Acute on chronic anemia   . Supplemental oxygen dependent   . Acute systolic congestive heart failure ([place])   . Pressure ulcer 05/23/2019  . Debility 05/22/2019  . Palliative care encounter   . Pressure injury of skin 03/25/2019  . Acute respiratory failure with hypoxia ([place])   . S/P CABG x 3 03/16/2019  . Non-ST elevation (NSTEMI) myocardial infarction ([place])   . Acute on chronic combined systolic and diastolic CHF (congestive heart failure) ([place])   . Coronary artery disease due to lipid rich plaque   . Atrial fibrillation with RVR ([place]) 03/14/2019  . Atrial fibrillation with rapid ventricular response ([place]) 03/13/2019  . Chest pain 09/12/2018  . Chronic diastolic heart failure ([place]) 05/29/2018  . Hypertension 05/29/2018  . Non Hodgkin's lymphoma ([place]) 09/30/2017  . Heme positive stool 08/11/2017  . Chronic anticoagulation 07/05/2016  . CAD S/P percutaneous coronary angioplasty 07/05/2016  . Type 2 diabetes mellitus without complications ([place]) 11/94/1740  . PAF (paroxysmal atrial fibrillation) ([place]) 06/14/2016  . Chest pain in adult 06/14/2016  . Dyspnea on exertion 06/14/2016  . Allergic rhinitis 06/14/2016  . GERD (gastroesophageal reflux disease) 06/14/2016  . BPH (benign prostatic hyperplasia) 06/14/2016    Past Surgical History:  Procedure Laterality Date  . BASCILIC VEIN TRANSPOSITION Right 09/10/2019    Procedure: RIGHT ARM 1ST STAGE BASCILIC VEIN TRANSPOSITION;  Surgeon: Angelia Mould, MD;  Location: [place];  Service: Vascular;  Laterality: Right;  . BASCILIC VEIN TRANSPOSITION Right 11/06/2019   Procedure: RIGHT UPPER EXTREMITY SECOND STAGE BASCILIC VEIN TRANSPOSITION;  Surgeon: Angelia Mould, MD;  Location: [place];  Service: Vascular;  Laterality: Right;  . BIOPSY  10/15/2019   Procedure: BIOPSY;  Surgeon: Daneil Dolin, MD;  [facility];  Service: Endoscopy;;  Distal Rectal Ulceration biopsies   . CHEST TUBE INSERTION Left 04/23/2019   Procedure: INSERTION PLEURAL DRAINAGE CATHETER TO DRAIN LEFT PLEURAL EFFUSION;  Surgeon: Ivin Poot, MD;  Location: [place];  Service: Thoracic;  Laterality: Left;  . CLIPPING OF ATRIAL APPENDAGE N/A 03/16/2019   Procedure: CLIPPING OF LEFT  ATRIAL APPENDAGE - USING ATRICLIP SIZE 40;  Surgeon: Ivin Poot, MD;  Location: [place];  Service: Open Heart Surgery;  Laterality: N/A;  . COLONOSCOPY N/A 08/25/2017   Procedure: COLONOSCOPY;  Surgeon: Rogene Houston, MD;  [facility];  Service: Endoscopy;  Laterality: N/A;  . CORONARY ARTERY BYPASS GRAFT N/A 03/16/2019   Procedure: CORONARY ARTERY BYPASS  GRAFTING (CABG), ON PUMP, TIMES THREE, USING LEFT INTERNAL MAMMARY ARTERY, RIGHT GREATER SAPHENOUS VEIN HARVESTED ENDOSCOPICALLY;  Surgeon: Ivin Poot, MD;  Location: [place];  Service: Open Heart Surgery;  Laterality: N/A;  swan only  . CYSTOSCOPY WITH FULGERATION N/A 04/30/2019   Procedure: CYSTOSCOPY WITH FULGERATION;  Surgeon: Irine Seal, MD;  Location: [place];  Service: Urology;  Laterality: N/A;  . FLEXIBLE SIGMOIDOSCOPY N/A 10/15/2019   Procedure: FLEXIBLE SIGMOIDOSCOPY with propofol;  Surgeon: Daneil Dolin, MD;  [facility];  Service: Endoscopy;  Laterality: N/A;  . FLEXIBLE SIGMOIDOSCOPY N/A 11/24/2019   Procedure: FLEXIBLE SIGMOIDOSCOPY;  Surgeon: Harvel Quale, MD;  [facility];  Service: Gastroenterology;  Laterality: N/A;  . HERNIA REPAIR    . IR FLUORO GUIDE CV LINE LEFT  04/23/2019  . IR FLUORO GUIDE CV LINE RIGHT  05/24/2019  . IR US GUIDE VASC ACCESS LEFT  04/23/2019  . MAZE N/A 03/16/2019   Procedure: MAZE;  Surgeon: Ivin Poot, MD;  Location: [place];  Service: Open Heart Surgery;  Laterality: N/A;  . PLACEMENT OF IMPELLA LEFT VENTRICULAR ASSIST DEVICE Right 03/27/2019   Procedure: Placement Of Impella Left Ventricular Assist Device using ABIOMED Impella 5.5 with SmartAssist Device.;  Surgeon: Wonda Olds, MD;  [facility];  Service: Thoracic;  Laterality: Right;  . POLYPECTOMY  11/24/2019   Procedure: POLYPECTOMY;  Surgeon: Harvel Quale, MD;  [facility];  Service: Gastroenterology;;  sigmoid  . PROSTATE SURGERY    . REMOVAL OF IMPELLA LEFT VENTRICULAR ASSIST DEVICE N/A 04/10/2019   Procedure: REMOVAL OF IMPELLA LEFT VENTRICULAR ASSIST DEVICE WITH INSERTION OF RIGHT FEMORAL ARTERIAL LINE;  Surgeon: Ivin Poot, MD;  Location: [place];  Service: Open Heart Surgery;  Laterality: N/A;  . REMOVAL OF PLEURAL DRAINAGE CATHETER Left 07/02/2019   Procedure: REMOVAL OF PLEURAL DRAINAGE CATHETER;  Surgeon: Ivin Poot, MD;  Location: [place];  Service: Thoracic;  Laterality: Left;  . RIGHT/LEFT HEART CATH AND CORONARY ANGIOGRAPHY N/A 03/15/2019   Procedure: RIGHT/LEFT HEART CATH AND CORONARY ANGIOGRAPHY;  Surgeon: Burnell Blanks, MD;  Location: [place] CV LAB;  Service: Cardiovascular;  Laterality: N/A;  . TEE WITHOUT CARDIOVERSION N/A 03/16/2019   Procedure: TRANSESOPHAGEAL ECHOCARDIOGRAM (TEE);  Surgeon: Prescott Gum, Collier Salina, MD;  Location: [place];  Service: Open Heart Surgery;  Laterality: N/A;  . TRACHEOSTOMY TUBE PLACEMENT N/A 03/27/2019   Procedure: TRACHEOSTOMY placed using Shiley 8DCT Cuffed.;  Surgeon: Wonda Olds, MD;  [facility];  Service: Thoracic;  Laterality: N/A;  . TRANSURETHRAL RESECTION OF BLADDER  NECK N/A 05/13/2019   Procedure: CYSTOSCOPY CLOT EVACUATION FULGRATION CYSTOGRAM AND INSTILLATION OF FORMALIN;  Surgeon: Lucas Mallow, MD;  Location: [place];  Service: Urology;  Laterality: N/A;       Family History  Problem Relation Age of Onset  . CAD Mother 48  . CAD Father 52  . CAD Sister   . CAD Brother     Social History   Tobacco Use  . Smoking status: Former Smoker    Packs/day: 0.25    Years: 50.00    Pack years: 12.50    Types: Cigarettes    Quit date: 02/15/1990    Years since quitting: 29.9  . Smokeless tobacco: Never Used  Vaping Use  . Vaping Use: Never used  Substance Use Topics  . Alcohol use: No  . Drug use: No    Home Medications Prior to  Admission medications   Medication Sig Start Date End Date Taking? Authorizing Provider  acetaminophen (TYLENOL) 500 MG tablet Take 500 mg by mouth every 6 (six) hours as needed for moderate pain.    [provider]  amiodarone (PACERONE) 200 MG tablet Take 1 tablet (200 mg total) by mouth See admin instructions. oral amiodarone 200mg  bid x 4 weeks, then 200mg  daily after that 12/12/19   Roxan Hockey, MD  aspirin 81 MG chewable tablet Chew 81 mg by mouth in the morning.  05/21/19   [provider]  Calcium Carb-Cholecalciferol (CALCIUM CARBONATE-VITAMIN D3) 600-400 MG-UNIT TABS Take 1 tablet by mouth daily.    [provider]  carboxymethylcellulose (REFRESH PLUS) 0.5 % SOLN Place 1 drop into both eyes 4 (four) times daily. 10/08/19   [provider]  Ensure Plus (ENSURE PLUS) LIQD Take 237 mLs by mouth 2 (two) times daily between meals.    [provider]  midodrine (PROAMATINE) 10 MG tablet Take 10 mg by mouth 2 (two) times daily.  10/08/19 10/07/20  [provider]  Nutritional Supplements (FEEDING SUPPLEMENT, NEPRO CARB STEADY,) LIQD Take 237 mLs by mouth at bedtime.    [provider]  nystatin ointment (MYCOSTATIN) Apply 1 application topically daily  as needed (Apply to Penis as needed if itching occurs.).  06/22/19   [provider]  oxyCODONE-acetaminophen (PERCOCET/ROXICET) 5-325 MG tablet Take 1 tablet by mouth every 4 (four) hours as needed for severe pain. 12/12/19   Roxan Hockey, MD  pantoprazole (PROTONIX) 40 MG tablet Take 1 tablet (40 mg total) by mouth daily. 12/12/19 12/11/20  Roxan Hockey, MD  polyethylene glycol (MIRALAX / GLYCOLAX) 17 g packet Take 17 g by mouth 2 (two) times daily. 12/12/19   Roxan Hockey, MD  predniSONE (DELTASONE) 10 MG tablet Take 40 mg by mouth daily.  01/20/2020   [provider]  predniSONE (DELTASONE) 10 MG tablet Take 50 mg by mouth daily with breakfast. 12/26/19 01/21/2020  [provider]  senna-docusate (SENOKOT-S) 8.6-50 MG tablet Take 2 tablets by mouth 2 (two) times daily. 12/12/19 12/11/20  Roxan Hockey, MD  triamcinolone cream (KENALOG) 0.1 % Apply 1 application topically 2 (two) times daily. Apply to skin that is intact. DO NOT apply to "burn wounds". 10/08/19 10/07/20  [provider]    Allergies    Atorvastatin, Adhesive [tape], Reglan [metoclopramide], and Heparin  Review of Systems   Review of Systems  Respiratory: Positive for shortness of breath.   All other systems reviewed and are negative.   Physical Exam Updated Vital Signs BP 136/61   Pulse 99   Temp 98.3 F (36.8 C)   Resp (!) 34   Ht 5\' 7"  (1.702 m)   Wt 66.7 kg   SpO2 100%   BMI 23.02 kg/m   Physical Exam Vitals and nursing note reviewed.   86 year old male, resting comfortably and in no acute distress. Vital signs are significant for rapid respiratory rate. Oxygen saturation is 100%, which is normal. Head is normocephalic and atraumatic. PERRLA, EOMI. Oropharynx is clear. Neck is nontender and supple without adenopathy or JVD. Back is nontender and there is no CVA tenderness. Lungs are clear without rales, wheezes, or rhonchi. Chest is nontender.  Dialysis access  catheters are present in the left subclavian area. Heart has regular rate and rhythm without murmur. Abdomen is soft, flat, nontender without masses or hepatosplenomegaly and peristalsis is normoactive. Extremities have 2+ edema, full range of motion is present.  Skin is warm and dry without rash. Neurologic: Mental status is normal, cranial nerves are intact, there are no motor or sensory deficits.  ED Results / Procedures / Treatments   Labs (all labs ordered are listed, but only abnormal results are displayed) Labs Reviewed  BASIC METABOLIC PANEL - Abnormal; Notable for the following components:      Result Value   Potassium 3.1 (*)    Chloride 92 (*)    Glucose, Bld 117 (*)    Creatinine, Ser 2.13 (*)    Calcium 7.9 (*)    GFR, Estimated 29 (*)    All other components within normal limits  CBC WITH DIFFERENTIAL/PLATELET - Abnormal; Notable for the following components:   RBC 3.07 (*)    Hemoglobin 9.8 (*)    HCT 33.7 (*)    MCV 109.8 (*)    MCHC 29.1 (*)    RDW 20.6 (*)    Platelets 90 (*)    nRBC 4.0 (*)    Lymphs Abs 0.1 (*)    Abs Immature Granulocytes 0.16 (*)    All other components within normal limits    EKG EKG Interpretation  Date/Time:  Wednesday January 23 2020 20:43:56 EST Ventricular Rate:  93 PR Interval:    QRS Duration: 113 QT Interval:  352 QTC Calculation: 438 R Axis:   38 Text Interpretation: Atrial fibrillation Ventricular premature complex LVH with secondary repolarization abnormality Anterior Q waves, possibly due to LVH When compared with ECG of 12/11/2019, No significant change was found Confirmed by Delora Fuel ([id number]) on 01/23/2020 8:46:44 PM   Radiology DG Chest 1 View  Result Date: 01/23/2020 CLINICAL DATA:  Shortness of breath EXAM: CHEST  1 VIEW COMPARISON:  12/26/2019, CT 04/20/2019 FINDINGS: Post sternotomy changes. Left-sided central venous catheter tip over the proximal right atrium. Small left-sided pleural effusion with  probable atelectasis left base. Stable cardiomediastinal silhouette with aortic atherosclerosis. No pneumothorax. IMPRESSION: Small left-sided pleural effusion with probable atelectasis at the left base. Effusion appears smaller when compared with 12/26/2019. Electronically Signed   By: Donavan Foil M.D.   On: 01/23/2020 21:34    Procedures Procedures   Medications Ordered in ED Medications  potassium chloride SA (KLOR-CON) CR tablet 40 mEq (has no administration in time range)    ED Course  I have reviewed the triage vital signs and the nursing notes.  Pertinent labs & imaging results that were available during my care of the patient were reviewed by me and considered in my medical decision making (see chart for details).  MDM Rules/Calculators/A&P Episode of dyspnea which has resolved.  Report of atrial fibrillation with rapid ventricular response.  He is currently still in atrial fibrillation, but with an appropriate ventricular response.  Old records are reviewed, and he has had PSVT and MAT and it is possible that either this or actually is rapid rhythm.  Will check chest x-ray and screening labs.  Patient has maintained a stable rhythm and good oxygen saturation throughout his ED stay.  Labs show changes of end-stage renal disease, mild hypokalemia, anemia of renal failure which is unchanged from baseline.  He is discharged with instructions to follow-up with PCP.  Final Clinical Impression(s) / ED Diagnoses Final diagnoses:  Shortness of breath  Chronic atrial fibrillation (HCC)  End-stage renal disease on hemodialysis ([place])  Hypokalemia  Macrocytic anemia  Anticoagulated on warfarin    Rx / DC Orders ED Discharge Orders    None       Roxanne Mins,  Shanon Brow, MD 01/23/20 2332

## 2020-01-23 NOTE — Discharge Instructions (Signed)
Continue with your usual treatment regimen.

## 2020-01-23 NOTE — ED Notes (Signed)
Unable to obtain IV access. No blood has been drawn at this time. Going to try and use the ultrasound.

## 2020-01-25 ENCOUNTER — Other Ambulatory Visit: Payer: Self-pay

## 2020-01-25 ENCOUNTER — Encounter (HOSPITAL_COMMUNITY): Payer: Self-pay | Admitting: Emergency Medicine

## 2020-01-25 ENCOUNTER — Inpatient Hospital Stay (HOSPITAL_COMMUNITY)
Admission: EM | Admit: 2020-01-25 | Discharge: 2020-02-16 | DRG: 177 | Disposition: E | Payer: Medicare Other | Source: Other Acute Inpatient Hospital | Attending: Internal Medicine | Admitting: Internal Medicine

## 2020-01-25 ENCOUNTER — Emergency Department (HOSPITAL_COMMUNITY): Payer: Medicare Other

## 2020-01-25 DIAGNOSIS — U071 COVID-19: Principal | ICD-10-CM | POA: Diagnosis present

## 2020-01-25 DIAGNOSIS — I4891 Unspecified atrial fibrillation: Secondary | ICD-10-CM | POA: Diagnosis not present

## 2020-01-25 DIAGNOSIS — J9691 Respiratory failure, unspecified with hypoxia: Secondary | ICD-10-CM | POA: Diagnosis present

## 2020-01-25 DIAGNOSIS — J9601 Acute respiratory failure with hypoxia: Secondary | ICD-10-CM

## 2020-01-25 DIAGNOSIS — Z87891 Personal history of nicotine dependence: Secondary | ICD-10-CM

## 2020-01-25 DIAGNOSIS — N2581 Secondary hyperparathyroidism of renal origin: Secondary | ICD-10-CM | POA: Diagnosis present

## 2020-01-25 DIAGNOSIS — E8889 Other specified metabolic disorders: Secondary | ICD-10-CM | POA: Diagnosis present

## 2020-01-25 DIAGNOSIS — D500 Iron deficiency anemia secondary to blood loss (chronic): Secondary | ICD-10-CM | POA: Diagnosis present

## 2020-01-25 DIAGNOSIS — Z992 Dependence on renal dialysis: Secondary | ICD-10-CM

## 2020-01-25 DIAGNOSIS — I255 Ischemic cardiomyopathy: Secondary | ICD-10-CM | POA: Diagnosis present

## 2020-01-25 DIAGNOSIS — I4892 Unspecified atrial flutter: Secondary | ICD-10-CM | POA: Diagnosis not present

## 2020-01-25 DIAGNOSIS — L100 Pemphigus vulgaris: Secondary | ICD-10-CM | POA: Diagnosis present

## 2020-01-25 DIAGNOSIS — J159 Unspecified bacterial pneumonia: Secondary | ICD-10-CM | POA: Diagnosis present

## 2020-01-25 DIAGNOSIS — I5042 Chronic combined systolic (congestive) and diastolic (congestive) heart failure: Secondary | ICD-10-CM | POA: Diagnosis present

## 2020-01-25 DIAGNOSIS — Z66 Do not resuscitate: Secondary | ICD-10-CM | POA: Diagnosis not present

## 2020-01-25 DIAGNOSIS — Z8546 Personal history of malignant neoplasm of prostate: Secondary | ICD-10-CM

## 2020-01-25 DIAGNOSIS — N186 End stage renal disease: Secondary | ICD-10-CM | POA: Diagnosis present

## 2020-01-25 DIAGNOSIS — D696 Thrombocytopenia, unspecified: Secondary | ICD-10-CM | POA: Diagnosis present

## 2020-01-25 DIAGNOSIS — Y95 Nosocomial condition: Secondary | ICD-10-CM | POA: Diagnosis present

## 2020-01-25 DIAGNOSIS — Z515 Encounter for palliative care: Secondary | ICD-10-CM | POA: Diagnosis not present

## 2020-01-25 DIAGNOSIS — E860 Dehydration: Secondary | ICD-10-CM | POA: Diagnosis present

## 2020-01-25 DIAGNOSIS — E876 Hypokalemia: Secondary | ICD-10-CM | POA: Diagnosis present

## 2020-01-25 DIAGNOSIS — Z7901 Long term (current) use of anticoagulants: Secondary | ICD-10-CM

## 2020-01-25 DIAGNOSIS — I959 Hypotension, unspecified: Secondary | ICD-10-CM | POA: Diagnosis not present

## 2020-01-25 DIAGNOSIS — K219 Gastro-esophageal reflux disease without esophagitis: Secondary | ICD-10-CM | POA: Diagnosis present

## 2020-01-25 DIAGNOSIS — Z7952 Long term (current) use of systemic steroids: Secondary | ICD-10-CM

## 2020-01-25 DIAGNOSIS — J1282 Pneumonia due to coronavirus disease 2019: Secondary | ICD-10-CM | POA: Diagnosis present

## 2020-01-25 DIAGNOSIS — D539 Nutritional anemia, unspecified: Secondary | ICD-10-CM | POA: Diagnosis present

## 2020-01-25 DIAGNOSIS — D631 Anemia in chronic kidney disease: Secondary | ICD-10-CM | POA: Diagnosis present

## 2020-01-25 DIAGNOSIS — I132 Hypertensive heart and chronic kidney disease with heart failure and with stage 5 chronic kidney disease, or end stage renal disease: Secondary | ICD-10-CM | POA: Diagnosis present

## 2020-01-25 DIAGNOSIS — E1122 Type 2 diabetes mellitus with diabetic chronic kidney disease: Secondary | ICD-10-CM | POA: Diagnosis present

## 2020-01-25 DIAGNOSIS — I480 Paroxysmal atrial fibrillation: Secondary | ICD-10-CM | POA: Diagnosis present

## 2020-01-25 DIAGNOSIS — Z8249 Family history of ischemic heart disease and other diseases of the circulatory system: Secondary | ICD-10-CM

## 2020-01-25 DIAGNOSIS — I2510 Atherosclerotic heart disease of native coronary artery without angina pectoris: Secondary | ICD-10-CM | POA: Diagnosis present

## 2020-01-25 DIAGNOSIS — R0902 Hypoxemia: Secondary | ICD-10-CM

## 2020-01-25 DIAGNOSIS — Z888 Allergy status to other drugs, medicaments and biological substances status: Secondary | ICD-10-CM

## 2020-01-25 DIAGNOSIS — Z7189 Other specified counseling: Secondary | ICD-10-CM | POA: Diagnosis not present

## 2020-01-25 DIAGNOSIS — Z951 Presence of aortocoronary bypass graft: Secondary | ICD-10-CM

## 2020-01-25 DIAGNOSIS — Z79899 Other long term (current) drug therapy: Secondary | ICD-10-CM

## 2020-01-25 DIAGNOSIS — I252 Old myocardial infarction: Secondary | ICD-10-CM

## 2020-01-25 DIAGNOSIS — Z7982 Long term (current) use of aspirin: Secondary | ICD-10-CM

## 2020-01-25 LAB — COMPREHENSIVE METABOLIC PANEL
ALT: <5 U/L (ref 0–44)
AST: 29 U/L (ref 15–41)
Albumin: 2.6 g/dL — ABNORMAL LOW (ref 3.5–5.0)
Alkaline Phosphatase: 53 U/L (ref 38–126)
Anion gap: 16 — ABNORMAL HIGH (ref 5–15)
BUN: 31 mg/dL — ABNORMAL HIGH (ref 8–23)
CO2: 27 mmol/L (ref 22–32)
Calcium: 7.9 mg/dL — ABNORMAL LOW (ref 8.9–10.3)
Chloride: 94 mmol/L — ABNORMAL LOW (ref 98–111)
Creatinine, Ser: 2.62 mg/dL — ABNORMAL HIGH (ref 0.61–1.24)
GFR, Estimated: 23 mL/min — ABNORMAL LOW (ref >60)
Glucose, Bld: 128 mg/dL — ABNORMAL HIGH (ref 70–99)
Potassium: 4.4 mmol/L (ref 3.5–5.1)
Sodium: 137 mmol/L (ref 135–145)
Total Bilirubin: 1.3 mg/dL — ABNORMAL HIGH (ref 0.3–1.2)
Total Protein: 5.7 g/dL — ABNORMAL LOW (ref 6.5–8.1)

## 2020-01-25 LAB — FIBRINOGEN: Fibrinogen: 756 mg/dL — ABNORMAL HIGH (ref 210–475)

## 2020-01-25 LAB — CBC WITH DIFFERENTIAL/PLATELET
Abs Immature Granulocytes: 0.08 10*3/uL — ABNORMAL HIGH (ref 0.00–0.07)
Basophils Absolute: 0 10*3/uL (ref 0.0–0.1)
Basophils Relative: 0 %
Eosinophils Absolute: 0 10*3/uL (ref 0.0–0.5)
Eosinophils Relative: 0 %
HCT: 30.7 % — ABNORMAL LOW (ref 39.0–52.0)
Hemoglobin: 8.8 g/dL — ABNORMAL LOW (ref 13.0–17.0)
Immature Granulocytes: 1 %
Lymphocytes Relative: 2 %
Lymphs Abs: 0.1 10*3/uL — ABNORMAL LOW (ref 0.7–4.0)
MCH: 31.9 pg (ref 26.0–34.0)
MCHC: 28.7 g/dL — ABNORMAL LOW (ref 30.0–36.0)
MCV: 111.2 fL — ABNORMAL HIGH (ref 80.0–100.0)
Monocytes Absolute: 0.3 10*3/uL (ref 0.1–1.0)
Monocytes Relative: 4 %
Neutro Abs: 5.9 10*3/uL (ref 1.7–7.7)
Neutrophils Relative %: 93 %
Platelets: 95 10*3/uL — ABNORMAL LOW (ref 150–400)
RBC: 2.76 MIL/uL — ABNORMAL LOW (ref 4.22–5.81)
RDW: 20.3 % — ABNORMAL HIGH (ref 11.5–15.5)
WBC: 6.3 10*3/uL (ref 4.0–10.5)
nRBC: 1.7 % — ABNORMAL HIGH (ref 0.0–0.2)

## 2020-01-25 LAB — LACTIC ACID, PLASMA: Lactic Acid, Venous: 4.4 mmol/L (ref 0.5–1.9)

## 2020-01-25 LAB — D-DIMER, QUANTITATIVE: D-Dimer, Quant: 2.41 ug/mL-FEU — ABNORMAL HIGH (ref 0.00–0.50)

## 2020-01-25 LAB — BRAIN NATRIURETIC PEPTIDE: B Natriuretic Peptide: 1437 pg/mL — ABNORMAL HIGH (ref 0.0–100.0)

## 2020-01-25 LAB — LACTATE DEHYDROGENASE: LDH: 234 U/L — ABNORMAL HIGH (ref 98–192)

## 2020-01-25 LAB — TROPONIN I (HIGH SENSITIVITY): Troponin I (High Sensitivity): 81 ng/L — ABNORMAL HIGH (ref <18)

## 2020-01-25 LAB — C-REACTIVE PROTEIN: CRP: 52.5 mg/dL — ABNORMAL HIGH (ref <1.0)

## 2020-01-25 LAB — RESP PANEL BY RT-PCR (FLU A&B, COVID) ARPGX2
Influenza A by PCR: NEGATIVE
Influenza B by PCR: NEGATIVE
SARS Coronavirus 2 by RT PCR: POSITIVE — AB

## 2020-01-25 LAB — FERRITIN: Ferritin: 1529 ng/mL — ABNORMAL HIGH (ref 24–336)

## 2020-01-25 LAB — PROCALCITONIN: Procalcitonin: 5.5 ng/mL

## 2020-01-25 LAB — TRIGLYCERIDES: Triglycerides: 101 mg/dL (ref <150)

## 2020-01-25 MED ORDER — SENNOSIDES-DOCUSATE SODIUM 8.6-50 MG PO TABS
2.0000 | ORAL_TABLET | Freq: Two times a day (BID) | ORAL | Status: DC
  Administered 2020-01-26 (×2): 2 via ORAL
  Filled 2020-01-25 (×3): qty 2

## 2020-01-25 MED ORDER — SODIUM CHLORIDE 0.9 % IV SOLN
1.0000 g | INTRAVENOUS | Status: DC
  Administered 2020-01-26 – 2020-01-27 (×3): 1 g via INTRAVENOUS
  Filled 2020-01-25 (×6): qty 1

## 2020-01-25 MED ORDER — ACETAMINOPHEN 325 MG PO TABS
650.0000 mg | ORAL_TABLET | Freq: Four times a day (QID) | ORAL | Status: DC | PRN
  Administered 2020-01-26: 650 mg via ORAL
  Filled 2020-01-25: qty 2

## 2020-01-25 MED ORDER — POLYETHYLENE GLYCOL 3350 17 G PO PACK: 17.0000 g | PACK | Freq: Every day | ORAL | Status: DC | PRN

## 2020-01-25 MED ORDER — POLYETHYLENE GLYCOL 3350 17 G PO PACK
17.0000 g | PACK | Freq: Two times a day (BID) | ORAL | Status: DC
  Administered 2020-01-26 (×2): 17 g via ORAL
  Filled 2020-01-25 (×3): qty 1

## 2020-01-25 MED ORDER — HYDROCOD POLST-CPM POLST ER 10-8 MG/5ML PO SUER
5.0000 mL | Freq: Two times a day (BID) | ORAL | Status: DC | PRN
  Administered 2020-01-26: 5 mL via ORAL
  Filled 2020-01-25: qty 5

## 2020-01-25 MED ORDER — SODIUM CHLORIDE 0.9 % IV SOLN: 100.0000 mg | Freq: Every day | INTRAVENOUS | Status: DC

## 2020-01-25 MED ORDER — OXYCODONE HCL 5 MG PO TABS
5.0000 mg | ORAL_TABLET | ORAL | Status: DC | PRN
  Administered 2020-01-26: 5 mg via ORAL
  Filled 2020-01-25: qty 1

## 2020-01-25 MED ORDER — ALBUTEROL SULFATE HFA 108 (90 BASE) MCG/ACT IN AERS
4.0000 | INHALATION_SPRAY | Freq: Once | RESPIRATORY_TRACT | Status: AC
  Administered 2020-01-25: 4 via RESPIRATORY_TRACT
  Filled 2020-01-25: qty 6.7

## 2020-01-25 MED ORDER — MIDODRINE HCL 5 MG PO TABS
10.0000 mg | ORAL_TABLET | Freq: Two times a day (BID) | ORAL | Status: DC
  Administered 2020-01-26: 10 mg via ORAL

## 2020-01-25 MED ORDER — PANTOPRAZOLE SODIUM 40 MG PO TBEC
40.0000 mg | DELAYED_RELEASE_TABLET | Freq: Every day | ORAL | Status: DC
  Administered 2020-01-26: 40 mg via ORAL
  Filled 2020-01-25 (×2): qty 1

## 2020-01-25 MED ORDER — ONDANSETRON HCL 4 MG PO TABS: 4.0000 mg | ORAL_TABLET | Freq: Four times a day (QID) | ORAL | Status: DC | PRN

## 2020-01-25 MED ORDER — SODIUM CHLORIDE 0.9 % IV SOLN
100.0000 mg | Freq: Every day | INTRAVENOUS | Status: DC
  Filled 2020-01-25: qty 20

## 2020-01-25 MED ORDER — ENSURE PLUS PO LIQD
237.0000 mL | Freq: Two times a day (BID) | ORAL | Status: DC
  Filled 2020-01-25 (×7): qty 237

## 2020-01-25 MED ORDER — ASPIRIN 81 MG PO CHEW: 81.0000 mg | CHEWABLE_TABLET | Freq: Every morning | ORAL | Status: DC

## 2020-01-25 MED ORDER — SODIUM CHLORIDE 0.9 % IV SOLN: 200.0000 mg | Freq: Once | INTRAVENOUS | Status: DC

## 2020-01-25 MED ORDER — SODIUM CHLORIDE 0.9 % IV SOLN
100.0000 mg | INTRAVENOUS | Status: AC
  Administered 2020-01-26: 100 mg via INTRAVENOUS
  Filled 2020-01-25: qty 20

## 2020-01-25 MED ORDER — VANCOMYCIN HCL IN DEXTROSE 750-5 MG/150ML-% IV SOLN
750.0000 mg | INTRAVENOUS | Status: DC
  Filled 2020-01-25: qty 150

## 2020-01-25 MED ORDER — ONDANSETRON HCL 4 MG/2ML IJ SOLN: 4.0000 mg | Freq: Four times a day (QID) | INTRAMUSCULAR | Status: DC | PRN

## 2020-01-25 MED ORDER — DEXAMETHASONE SODIUM PHOSPHATE 10 MG/ML IJ SOLN
6.0000 mg | INTRAMUSCULAR | Status: DC
  Administered 2020-01-26: 6 mg via INTRAVENOUS
  Filled 2020-01-25: qty 1

## 2020-01-25 MED ORDER — VANCOMYCIN HCL 1500 MG/300ML IV SOLN
1500.0000 mg | Freq: Once | INTRAVENOUS | Status: AC
  Administered 2020-01-25: 1500 mg via INTRAVENOUS
  Filled 2020-01-25: qty 300

## 2020-01-25 MED ORDER — AMIODARONE HCL 200 MG PO TABS
200.0000 mg | ORAL_TABLET | Freq: Every day | ORAL | Status: DC
  Administered 2020-01-26: 200 mg via ORAL
  Filled 2020-01-25 (×2): qty 1

## 2020-01-25 MED ORDER — GUAIFENESIN-DM 100-10 MG/5ML PO SYRP: 10.0000 mL | ORAL_SOLUTION | ORAL | Status: DC | PRN

## 2020-01-25 NOTE — ED Provider Notes (Signed)
[facility] EMERGENCY DEPARTMENT Provider Note   CSN: [id number] Arrival date & time: 01/22/2020  1847     History Chief Complaint  Patient presents with  . Shortness of Breath    Jesse Murphy is a 86 y.o. male.  HPI 86 year old male presents with shortness of breath.  Started around a few hours ago when he got to dialysis.  He finishes dialysis but still dyspneic.  Similar thing happened 2 days ago.  He was diagnosed with Covid about a week and a half ago.  He has been fully vaccinated including the booster.  No fevers or significant cough.  Chronic leg swelling.  Was put on oxygen which she does not normally wear.  Past Medical History:  Diagnosis Date  . Anemia   . CAD (coronary artery disease)    a. s/p PCI in 1992 b. Coronary CT in 01/2018 showing extensive coronary calcification; FFR indeterminate --> medical management pursued at that time as patient not interested in repeat cath. c. s/p CABGx3 in 02/2019.  . Cancer [facility])    prostate  . Cardiogenic shock ([place])   . CHB (complete heart block) (HCC)    a. due to vagal event in 2021, requiring temp pacer.  . Chronic combined systolic and diastolic CHF (congestive heart failure) ([place])   . Debilitated   . Decubitus ulcer of coccyx, unspecified pressure ulcer stage   . Dyspnea   . End stage renal disease ([place])   . Esophagitis   . H/O tracheostomy    a. during admit in 2021 for respiratory failure.  . Hematuria   . Hypertension   . Hypoalbuminemia   . Ischemic cardiomyopathy   . Myocardial infarction ([place])   . PAF (paroxysmal atrial fibrillation) ([place])   . 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 Pleural effusion, left    a. s/p pleurX catheter  . Pressure ulcer of both heels   . Pseudomonas pneumonia ([place])   . Thrombocytopenia ([place])   . Walker as ambulation aid    also uses wheelchair    Patient Active Problem List   Diagnosis Date Noted  . Tachycardia 12/11/2019  . Multifocal atrial tachycardia ([place]) 12/11/2019  . HCAP  (healthcare-associated pneumonia) 12/02/2019  . Ischemic cardiomyopathy   . History of Pseudomonas pneumonia ([place])   . Stercoral ulcer of rectum   . DNR (do not resuscitate)   . Systolic and diastolic CHF, chronic ([place]) 11/24/2019  . Acute blood loss anemia 11/23/2019  . Adverse effect of antihyperlipidemic and antiarteriosclerotic drugs, initial encounter 11/07/2019  . Pure hypercholesterolemia 11/07/2019  . Skin desquamation 11/07/2019  . Gastrointestinal hemorrhage, unspecified 10/18/2019  . Advanced care planning/counseling discussion   . End stage renal disease on dialysis ([place])   . Palliative care by specialist   . Goals of care, counseling/discussion   . Rectal bleeding 10/13/2019  . Inguinal hernia of left side without obstruction or gangrene   . Pemphigus vulgaris-biopsy confirmed in August 2021 10/10/2019  . 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 History of atrial fibrillation- treated surgically, no longer has it per the patient 10/01/2019  . Personal history of prostate cancer 10/01/2019  . Abnormality of gait 09/24/2019  . Hypothyroidism, unspecified 09/19/2019  . Anaphylactic shock, unspecified, initial encounter 09/05/2019  . History of pleural effusion 08/22/2019  . Recurrent left pleural effusion 07/11/2019  . Hematuria 07/09/2019  . Postop check 06/27/2019  . Unspecified protein-calorie malnutrition ([place]) 06/22/2019  . Coagulation defect, unspecified (  [place]) 06/19/2019  . AKI (acute kidney injury) ([place]) 06/18/2019  . Anemia in chronic kidney disease 06/14/2019  . Cardiogenic shock ([place]) 06/14/2019  . History of chest tube placement 06/14/2019  . Diarrhea, unspecified 06/14/2019  . Iron deficiency anemia, unspecified 06/14/2019  . Pain, unspecified 06/14/2019  . Pruritus, unspecified 06/14/2019  . Secondary hyperparathyroidism of renal origin ([place]) 06/14/2019  . Adult failure to thrive   . History  of non-Hodgkin's lymphoma   . Chest tube in place   . Failure to thrive (child)   . Prediabetes   . Hypoalbuminemia due to protein-calorie malnutrition ([place])   . Failure to thrive in adult   . Allergic contact dermatitis   . ESRD on dialysis ([place])   . Dysphagia   . Thrombocytopenia ([place])   . Acute on chronic anemia   . Supplemental oxygen dependent   . Acute systolic congestive heart failure ([place])   . Pressure ulcer 05/23/2019  . Debility 05/22/2019  . Palliative care encounter   . Pressure injury of skin 03/25/2019  . Acute respiratory failure with hypoxia ([place])   . S/P CABG x 3 03/16/2019  . Non-ST elevation (NSTEMI) myocardial infarction ([place])   . Acute on chronic combined systolic and diastolic CHF (congestive heart failure) ([place])   . Coronary artery disease due to lipid rich plaque   . Atrial fibrillation with RVR ([place]) 03/14/2019  . Atrial fibrillation with rapid ventricular response ([place]) 03/13/2019  . Chest pain 09/12/2018  . Chronic diastolic heart failure ([place]) 05/29/2018  . Hypertension 05/29/2018  . Non Hodgkin's lymphoma ([employer]) 09/30/2017  . Heme positive stool 08/11/2017  . Chronic anticoagulation 07/05/2016  . CAD S/P percutaneous coronary angioplasty 07/05/2016  . Type 2 diabetes mellitus without complications ([place]) 46/65/9935  . PAF (paroxysmal atrial fibrillation) ([place]) 06/14/2016  . Chest pain in adult 06/14/2016  . Dyspnea on exertion 06/14/2016  . Allergic rhinitis 06/14/2016  . GERD (gastroesophageal reflux disease) 06/14/2016  . BPH (benign prostatic hyperplasia) 06/14/2016    Past Surgical History:  Procedure Laterality Date  . BASCILIC VEIN TRANSPOSITION Right 09/10/2019   Procedure: RIGHT ARM 1ST STAGE BASCILIC VEIN TRANSPOSITION;  Surgeon: Angelia Mould, MD;  Location: [place];  Service: Vascular;  Laterality: Right;  . BASCILIC VEIN TRANSPOSITION Right 11/06/2019   Procedure: RIGHT UPPER EXTREMITY SECOND STAGE BASCILIC VEIN TRANSPOSITION;   Surgeon: Angelia Mould, MD;  Location: [place];  Service: Vascular;  Laterality: Right;  . BIOPSY  10/15/2019   Procedure: BIOPSY;  Surgeon: Daneil Dolin, MD;  [facility];  Service: Endoscopy;;  Distal Rectal Ulceration biopsies   . CHEST TUBE INSERTION Left 04/23/2019   Procedure: INSERTION PLEURAL DRAINAGE CATHETER TO DRAIN LEFT PLEURAL EFFUSION;  Surgeon: Ivin Poot, MD;  Location: [place];  Service: Thoracic;  Laterality: Left;  . CLIPPING OF ATRIAL APPENDAGE N/A 03/16/2019   Procedure: CLIPPING OF LEFT  ATRIAL APPENDAGE - USING ATRICLIP SIZE 40;  Surgeon: Ivin Poot, MD;  Location: [place];  Service: Open Heart Surgery;  Laterality: N/A;  . COLONOSCOPY N/A 08/25/2017   Procedure: COLONOSCOPY;  Surgeon: Rogene Houston, MD;  [facility];  Service: Endoscopy;  Laterality: N/A;  . CORONARY ARTERY BYPASS GRAFT N/A 03/16/2019   Procedure: CORONARY ARTERY BYPASS GRAFTING (CABG), ON PUMP, TIMES THREE, USING LEFT INTERNAL MAMMARY ARTERY, RIGHT GREATER SAPHENOUS VEIN HARVESTED ENDOSCOPICALLY;  Surgeon: Ivin Poot, MD;  Location: [place];  Service: Open Heart Surgery;  Laterality: N/A;  swan  only  . CYSTOSCOPY WITH FULGERATION N/A 04/30/2019   Procedure: CYSTOSCOPY WITH FULGERATION;  Surgeon: Irine Seal, MD;  Location: [place];  Service: Urology;  Laterality: N/A;  . FLEXIBLE SIGMOIDOSCOPY N/A 10/15/2019   Procedure: FLEXIBLE SIGMOIDOSCOPY with propofol;  Surgeon: Daneil Dolin, MD;  [facility];  Service: Endoscopy;  Laterality: N/A;  . FLEXIBLE SIGMOIDOSCOPY N/A 11/24/2019   Procedure: FLEXIBLE SIGMOIDOSCOPY;  Surgeon: Harvel Quale, MD;  [facility];  Service: Gastroenterology;  Laterality: N/A;  . HERNIA REPAIR    . IR FLUORO GUIDE CV LINE LEFT  04/23/2019  . IR FLUORO GUIDE CV LINE RIGHT  05/24/2019  . IR US GUIDE VASC ACCESS LEFT  04/23/2019  . MAZE N/A 03/16/2019   Procedure: MAZE;  Surgeon: Ivin Poot, MD;  Location:  [place];  Service: Open Heart Surgery;  Laterality: N/A;  . PLACEMENT OF IMPELLA LEFT VENTRICULAR ASSIST DEVICE Right 03/27/2019   Procedure: Placement Of Impella Left Ventricular Assist Device using ABIOMED Impella 5.5 with SmartAssist Device.;  Surgeon: Wonda Olds, MD;  [facility];  Service: Thoracic;  Laterality: Right;  . POLYPECTOMY  11/24/2019   Procedure: POLYPECTOMY;  Surgeon: Harvel Quale, MD;  [facility];  Service: Gastroenterology;;  sigmoid  . PROSTATE SURGERY    . REMOVAL OF IMPELLA LEFT VENTRICULAR ASSIST DEVICE N/A 04/10/2019   Procedure: REMOVAL OF IMPELLA LEFT VENTRICULAR ASSIST DEVICE WITH INSERTION OF RIGHT FEMORAL ARTERIAL LINE;  Surgeon: Ivin Poot, MD;  Location: [place];  Service: Open Heart Surgery;  Laterality: N/A;  . REMOVAL OF PLEURAL DRAINAGE CATHETER Left 07/02/2019   Procedure: REMOVAL OF PLEURAL DRAINAGE CATHETER;  Surgeon: Ivin Poot, MD;  Location: [place];  Service: Thoracic;  Laterality: Left;  . RIGHT/LEFT HEART CATH AND CORONARY ANGIOGRAPHY N/A 03/15/2019   Procedure: RIGHT/LEFT HEART CATH AND CORONARY ANGIOGRAPHY;  Surgeon: Burnell Blanks, MD;  Location: [place] [facility];  Service: Cardiovascular;  Laterality: N/A;  . TEE WITHOUT CARDIOVERSION N/A 03/16/2019   Procedure: TRANSESOPHAGEAL ECHOCARDIOGRAM (TEE);  Surgeon: Prescott Gum, Collier Salina, MD;  Location: [place];  Service: Open Heart Surgery;  Laterality: N/A;  . TRACHEOSTOMY TUBE PLACEMENT N/A 03/27/2019   Procedure: TRACHEOSTOMY placed using Shiley 8DCT Cuffed.;  Surgeon: Wonda Olds, MD;  [facility];  Service: Thoracic;  Laterality: N/A;  . TRANSURETHRAL RESECTION OF BLADDER NECK N/A 05/13/2019   Procedure: CYSTOSCOPY CLOT EVACUATION FULGRATION CYSTOGRAM AND INSTILLATION OF FORMALIN;  Surgeon: Lucas Mallow, MD;  Location: [place];  Service: Urology;  Laterality: N/A;       Family History  Problem Relation Age of Onset  . CAD Mother 69  . CAD  Father 14  . CAD Sister   . CAD Brother     Social History   Tobacco Use  . Smoking status: Former Smoker    Packs/day: 0.25    Years: 50.00    Pack years: 12.50    Types: Cigarettes    Quit date: 02/15/1990    Years since quitting: 29.9  . Smokeless tobacco: Never Used  Vaping Use  . Vaping Use: Never used  Substance Use Topics  . Alcohol use: No  . Drug use: No    Home Medications Prior to Admission medications   Medication Sig Start Date End Date Taking? Authorizing Provider  acetaminophen (TYLENOL) 500 MG tablet Take 500 mg by mouth every 6 (six) hours as needed for moderate pain.    [provider]  acetaminophen (TYLENOL) 650 MG CR tablet Take 650 mg by mouth every 8 (eight) hours as needed for pain.    [provider]  amiodarone (PACERONE) 200 MG tablet Take 1 tablet (200 mg total) by mouth See admin instructions. oral amiodarone 200mg  bid x 4 weeks, then 200mg  daily after that Patient taking differently: Take 200 mg by mouth daily.  12/12/19   Roxan Hockey, MD  aspirin 81 MG chewable tablet Chew 81 mg by mouth in the morning.  05/21/19   [provider]  b complex-vitamin c-folic acid (NEPHRO-VITE) 0.8 MG TABS tablet Take 1 tablet by mouth at bedtime.    [provider]  carboxymethylcellulose (REFRESH PLUS) 0.5 % SOLN Place 1 drop into both eyes 4 (four) times daily. 10/08/19   [provider]  clotrimazole (MYCELEX) 10 MG troche Take 10 mg by mouth 2 (two) times daily as needed.     [provider]  Ensure Plus (ENSURE PLUS) LIQD Take 237 mLs by mouth 2 (two) times daily between meals.    [provider]  midodrine (PROAMATINE) 10 MG tablet Take 10 mg by mouth 2 (two) times daily.  10/08/19 10/07/20  [provider]  nystatin ointment (MYCOSTATIN) Apply 1 application topically daily as needed (Apply to Penis as needed if itching occurs.).  06/22/19   [provider]  ondansetron (ZOFRAN) 4 MG  tablet Take 4 mg by mouth every 8 (eight) hours as needed for nausea or vomiting.    [provider]  pantoprazole (PROTONIX) 40 MG tablet Take 1 tablet (40 mg total) by mouth daily. 12/12/19 12/11/20  Roxan Hockey, MD  polyethylene glycol (MIRALAX / GLYCOLAX) 17 g packet Take 17 g by mouth 2 (two) times daily. 12/12/19   Roxan Hockey, MD  predniSONE (DELTASONE) 10 MG tablet Take 40 mg by mouth daily.  01/30/2020   [provider]  senna-docusate (SENOKOT-S) 8.6-50 MG tablet Take 2 tablets by mouth 2 (two) times daily. 12/12/19 12/11/20  Roxan Hockey, MD  Calcium Carb-Cholecalciferol (CALCIUM CARBONATE-VITAMIN D3) 600-400 MG-UNIT TABS Take 1 tablet by mouth daily. Patient not taking: Reported on 01/23/2020  01/23/20  [provider]    Allergies    Atorvastatin, Adhesive [tape], Reglan [metoclopramide], and Heparin  Review of Systems   Review of Systems  Constitutional: Negative for fever.  Respiratory: Positive for shortness of breath.   Cardiovascular: Positive for leg swelling. Negative for chest pain.  All other systems reviewed and are negative.   Physical Exam Updated Vital Signs BP 119/64 (BP Location: Left Arm)   Pulse (!) 102   Temp 97.7 F (36.5 C) (Oral)   Resp (!) 30   Ht 5\' 7"  (1.702 m)   Wt 66.7 kg   SpO2 97%   BMI 23.02 kg/m   Physical Exam Vitals and nursing note reviewed.  Constitutional:      General: He is not in acute distress.    Appearance: He is well-developed and well-nourished. He is not ill-appearing or diaphoretic.  HENT:     Head: Normocephalic and atraumatic.     Right Ear: External ear normal.     Left Ear: External ear normal.     Nose: Nose normal.  Eyes:     General:        Right eye: No discharge.        Left eye: No discharge.  Cardiovascular:     Rate and Rhythm: Regular rhythm. Tachycardia present.  Heart sounds: Normal heart sounds.  Pulmonary:     Effort: Pulmonary effort is normal.      Breath sounds: Wheezing, rhonchi and rales present.  Abdominal:     Palpations: Abdomen is soft.     Tenderness: There is no abdominal tenderness.  Musculoskeletal:     Cervical back: Neck supple.     Right lower leg: Edema present.     Left lower leg: Edema present.  Skin:    General: Skin is warm and dry.  Neurological:     Mental Status: He is alert.  Psychiatric:        Mood and Affect: Mood is not anxious.     ED Results / Procedures / Treatments   Labs (all labs ordered are listed, but only abnormal results are displayed) Labs Reviewed  RESP PANEL BY RT-PCR (FLU A&B, COVID) ARPGX2 - Abnormal; Notable for the following components:      Result Value   SARS Coronavirus 2 by RT PCR POSITIVE (*)    All other components within normal limits  BRAIN NATRIURETIC PEPTIDE - Abnormal; Notable for the following components:   B Natriuretic Peptide 1,437.0 (*)    All other components within normal limits  LACTIC ACID, PLASMA - Abnormal; Notable for the following components:   Lactic Acid, Venous 4.4 (*)    All other components within normal limits  CBC WITH DIFFERENTIAL/PLATELET - Abnormal; Notable for the following components:   RBC 2.76 (*)    Hemoglobin 8.8 (*)    HCT 30.7 (*)    MCV 111.2 (*)    MCHC 28.7 (*)    RDW 20.3 (*)    Platelets 95 (*)    nRBC 1.7 (*)    Lymphs Abs 0.1 (*)    Abs Immature Granulocytes 0.08 (*)    All other components within normal limits  COMPREHENSIVE METABOLIC PANEL - Abnormal; Notable for the following components:   Chloride 94 (*)    Glucose, Bld 128 (*)    BUN 31 (*)    Creatinine, Ser 2.62 (*)    Calcium 7.9 (*)    Total Protein 5.7 (*)    Albumin 2.6 (*)    Total Bilirubin 1.3 (*)    GFR, Estimated 23 (*)    Anion gap 16 (*)    All other components within normal limits  D-DIMER, QUANTITATIVE (NOT AT [facility]) - Abnormal; Notable for the following components:   D-Dimer, Quant 2.41 (*)    All other components within normal limits   LACTATE DEHYDROGENASE - Abnormal; Notable for the following components:   LDH 234 (*)    All other components within normal limits  FIBRINOGEN - Abnormal; Notable for the following components:   Fibrinogen 756 (*)    All other components within normal limits  TROPONIN I (HIGH SENSITIVITY) - Abnormal; Notable for the following components:   Troponin I (High Sensitivity) 81 (*)    All other components within normal limits  CULTURE, BLOOD (ROUTINE X 2)  CULTURE, BLOOD (ROUTINE X 2)  PROCALCITONIN  TRIGLYCERIDES  LACTIC ACID, PLASMA  FERRITIN  C-REACTIVE PROTEIN  TROPONIN I (HIGH SENSITIVITY)    EKG EKG Interpretation  Date/Time:  Friday January 25 2020 20:40:37 EST Ventricular Rate:  131 PR Interval:    QRS Duration: 132 QT Interval:  328 QTC Calculation: 470 R Axis:   23 Text Interpretation: Atrial flutter IVCD, consider atypical LBBB Confirmed by Sherwood Gambler [phone]) on 01/30/2020 8:58:19 PM   Radiology DG Chest [facility]  1 View  Result Date: 01/22/2020 CLINICAL DATA:  Dyspnea EXAM: PORTABLE CHEST 1 VIEW COMPARISON:  01/23/2020, 12/11/2019, 08/22/2019 FINDINGS: Left-sided central venous catheter with tip over the right atrium. Post sternotomy changes. Similar small left pleural effusion with basilar airspace disease. Stable cardiomediastinal silhouette with aortic atherosclerosis. No pneumothorax. IMPRESSION: Small left pleural effusion with basilar airspace disease, atelectasis versus pneumonia. No significant change compared with radiograph 2 days ago. Electronically Signed   By: Donavan Foil M.D.   On: 02/11/2020 19:47    Procedures Ultrasound ED Peripheral IV (Provider)  Date/Time: 01/27/2020 8:38 PM Performed by: Sherwood Gambler, MD Authorized by: Sherwood Gambler, MD   Procedure details:    Indications: multiple failed IV attempts and poor IV access     Skin Prep: chlorhexidine gluconate     Location:  Left AC   Angiocath:  20 G   Bedside Ultrasound Guided:  Yes     Patient tolerated procedure without complications: Yes     Dressing applied: Yes    .Critical Care Performed by: Sherwood Gambler, MD Authorized by: Sherwood Gambler, MD   Critical care provider statement:    Critical care time (minutes):  35   Critical care time was exclusive of:  Separately billable procedures and treating other patients   Critical care was necessary to treat or prevent imminent or life-threatening deterioration of the following conditions:  Respiratory failure and sepsis   Critical care was time spent personally by me on the following activities:  Discussions with consultants, evaluation of patient's response to treatment, examination of patient, ordering and performing treatments and interventions, ordering and review of laboratory studies, ordering and review of radiographic studies, pulse oximetry, re-evaluation of patient's condition, obtaining history from patient or surrogate and review of old charts   (including critical care time)  Medications Ordered in ED Medications  ceFEPIme (MAXIPIME) 1 g in sodium chloride 0.9 % 100 mL IVPB (has no administration in time range)  vancomycin (VANCOREADY) IVPB 1500 mg/300 mL (1,500 mg Intravenous New Bag/Given 01/22/2020 2238)  vancomycin (VANCOCIN) IVPB 750 mg/150 ml premix (has no administration in time range)  albuterol (VENTOLIN HFA) 108 (90 Base) MCG/ACT inhaler 4 puff (4 puffs Inhalation Given 01/23/2020 1934)    ED Course  I have reviewed the triage vital signs and the nursing notes.  Pertinent labs & imaging results that were available during my care of the patient were reviewed by me and considered in my medical decision making (see chart for details).    MDM Rules/Calculators/A&P                          Patient does not appear significantly ill but is hypoxic on room air and requiring about 3 L of oxygen.  Unclear if this is Covid versus bacterial pneumonia as his original COVID symptoms started about 10  days ago.  His procalcitonin is elevated which would lean towards pneumonia but his x-ray is not particularly impressive.  He is afebrile.  He does have some other abnormal findings such as his lactic acid of 4.  However he is not hypotensive or appearing like he is septic.  Given his mixed picture, will give IV antibiotics but I do not think he needs to be flooded with IV fluids at this point.  Discussed with hospitalist for admission. Final Clinical Impression(s) / ED Diagnoses Final diagnoses:  Acute respiratory failure with hypoxia ([place])  COVID-19    Rx / DC Orders ED Discharge  Orders    None       Sherwood Gambler, MD 01/27/2020 [phone]

## 2020-01-25 NOTE — Progress Notes (Signed)
Pharmacy Antibiotic Note  Jesse Murphy is a 86 y.o. male admitted on 01/18/2020 with Pneumonia.  Pharmacy has been consulted for vancomycin and cefepime dosing. Patient with ESRD on Hemodialysis MWF. Patient attended HD today, but removed after ~1 hour.   Plan: Cefepime 1gm IV q24h Vancomycin 1500mg  IV x 1, then 750mg  IV qHD MWF Monitor for clinical course, deescalation, and LOT  Height: 5\' 7"  (170.2 cm) Weight: 66.7 kg (147 lb) IBW/kg (Calculated) : 66.1  Temp (24hrs), Avg:97.7 F (36.5 C), Min:97.7 F (36.5 C), Max:97.7 F (36.5 C)  Recent Labs  Lab 01/23/20 2227 02/13/2020 2030  WBC 5.7 6.3  CREATININE 2.13*  --   LATICACIDVEN  --  4.4*    Estimated Creatinine Clearance: 22.4 mL/min (A) (by C-G formula based on SCr of 2.13 mg/dL (H)).    Allergies  Allergen Reactions  . Atorvastatin Anaphylaxis and Rash    Leg pain  . Adhesive [Tape]     Tears skin  . Reglan [Metoclopramide]     Rash (see 05/2019 hospital notes), but was on other medications at the time so unclear which agent  . Heparin Rash    Thrombocytopenia/ HIT    Antimicrobials this admission: 12/10 vancomycin >>  12/10 cefepime >>   Dose adjustments this admission: n/a  Microbiology results:   Jesse Murphy A. Jesse Murphy, PharmD, BCPS, FNKF Clinical Pharmacist [place] Please utilize Amion for appropriate phone number to reach the unit pharmacist ([place])   01/17/2020 9:43 PM

## 2020-01-25 NOTE — ED Notes (Signed)
Pts a difficult stick. Charge nurse at the bedside to place IV's.

## 2020-01-25 NOTE — ED Notes (Signed)
Family updated on pts status per their request.

## 2020-01-25 NOTE — ED Triage Notes (Signed)
Pt c/o of sob during dialysis, covid +

## 2020-01-25 NOTE — ED Notes (Signed)
Date and time results received: 01/18/2020 9:26 PM(use smartphrase ".now" to insert current time)  Test: lactic acid Critical Value: 4.4  Name of Provider Notified: Dr Regenia Skeeter  Orders Received? Or Actions Taken?: see chart

## 2020-01-26 DIAGNOSIS — N186 End stage renal disease: Secondary | ICD-10-CM

## 2020-01-26 DIAGNOSIS — U071 COVID-19: Principal | ICD-10-CM

## 2020-01-26 DIAGNOSIS — R778 Other specified abnormalities of plasma proteins: Secondary | ICD-10-CM

## 2020-01-26 DIAGNOSIS — J9601 Acute respiratory failure with hypoxia: Secondary | ICD-10-CM

## 2020-01-26 DIAGNOSIS — Z992 Dependence on renal dialysis: Secondary | ICD-10-CM

## 2020-01-26 LAB — COMPREHENSIVE METABOLIC PANEL
ALT: 15 U/L (ref 0–44)
AST: 20 U/L (ref 15–41)
Albumin: 2.1 g/dL — ABNORMAL LOW (ref 3.5–5.0)
Alkaline Phosphatase: 46 U/L (ref 38–126)
Anion gap: 17 — ABNORMAL HIGH (ref 5–15)
BUN: 40 mg/dL — ABNORMAL HIGH (ref 8–23)
CO2: 26 mmol/L (ref 22–32)
Calcium: 7.7 mg/dL — ABNORMAL LOW (ref 8.9–10.3)
Chloride: 94 mmol/L — ABNORMAL LOW (ref 98–111)
Creatinine, Ser: 2.95 mg/dL — ABNORMAL HIGH (ref 0.61–1.24)
GFR, Estimated: 20 mL/min — ABNORMAL LOW (ref >60)
Glucose, Bld: 112 mg/dL — ABNORMAL HIGH (ref 70–99)
Potassium: 4.1 mmol/L (ref 3.5–5.1)
Sodium: 137 mmol/L (ref 135–145)
Total Bilirubin: 1.1 mg/dL (ref 0.3–1.2)
Total Protein: 4.9 g/dL — ABNORMAL LOW (ref 6.5–8.1)

## 2020-01-26 LAB — CBC WITH DIFFERENTIAL/PLATELET
Abs Immature Granulocytes: 0.08 10*3/uL — ABNORMAL HIGH (ref 0.00–0.07)
Basophils Absolute: 0 10*3/uL (ref 0.0–0.1)
Basophils Relative: 0 %
Eosinophils Absolute: 0 10*3/uL (ref 0.0–0.5)
Eosinophils Relative: 0 %
HCT: 27.6 % — ABNORMAL LOW (ref 39.0–52.0)
Hemoglobin: 8 g/dL — ABNORMAL LOW (ref 13.0–17.0)
Immature Granulocytes: 1 %
Lymphocytes Relative: 1 %
Lymphs Abs: 0.1 10*3/uL — ABNORMAL LOW (ref 0.7–4.0)
MCH: 31.7 pg (ref 26.0–34.0)
MCHC: 29 g/dL — ABNORMAL LOW (ref 30.0–36.0)
MCV: 109.5 fL — ABNORMAL HIGH (ref 80.0–100.0)
Monocytes Absolute: 0.2 10*3/uL (ref 0.1–1.0)
Monocytes Relative: 3 %
Neutro Abs: 5.7 10*3/uL (ref 1.7–7.7)
Neutrophils Relative %: 95 %
Platelets: 71 10*3/uL — ABNORMAL LOW (ref 150–400)
RBC: 2.52 MIL/uL — ABNORMAL LOW (ref 4.22–5.81)
RDW: 20.1 % — ABNORMAL HIGH (ref 11.5–15.5)
WBC: 6.1 10*3/uL (ref 4.0–10.5)
nRBC: 0.7 % — ABNORMAL HIGH (ref 0.0–0.2)

## 2020-01-26 LAB — D-DIMER, QUANTITATIVE: D-Dimer, Quant: 2.59 ug/mL-FEU — ABNORMAL HIGH (ref 0.00–0.50)

## 2020-01-26 LAB — LACTIC ACID, PLASMA: Lactic Acid, Venous: 1.9 mmol/L (ref 0.5–1.9)

## 2020-01-26 LAB — PHOSPHORUS: Phosphorus: 5.4 mg/dL — ABNORMAL HIGH (ref 2.5–4.6)

## 2020-01-26 LAB — MAGNESIUM: Magnesium: 1.9 mg/dL (ref 1.7–2.4)

## 2020-01-26 LAB — C-REACTIVE PROTEIN: CRP: 51.5 mg/dL — ABNORMAL HIGH (ref <1.0)

## 2020-01-26 LAB — TROPONIN I (HIGH SENSITIVITY): Troponin I (High Sensitivity): 121 ng/L (ref <18)

## 2020-01-26 LAB — PROCALCITONIN: Procalcitonin: 8.64 ng/mL

## 2020-01-26 LAB — FERRITIN: Ferritin: 1402 ng/mL — ABNORMAL HIGH (ref 24–336)

## 2020-01-26 MED ORDER — ZINC SULFATE 220 (50 ZN) MG PO CAPS
220.0000 mg | ORAL_CAPSULE | Freq: Every day | ORAL | Status: DC
  Administered 2020-01-26: 220 mg via ORAL
  Filled 2020-01-26 (×2): qty 1

## 2020-01-26 MED ORDER — ASCORBIC ACID 500 MG PO TABS
500.0000 mg | ORAL_TABLET | Freq: Every day | ORAL | Status: DC
  Administered 2020-01-26: 500 mg via ORAL
  Filled 2020-01-26 (×2): qty 1

## 2020-01-26 MED ORDER — DEXAMETHASONE SODIUM PHOSPHATE 10 MG/ML IJ SOLN
6.0000 mg | INTRAMUSCULAR | Status: DC
  Administered 2020-01-27 – 2020-01-28 (×2): 6 mg via INTRAVENOUS
  Filled 2020-01-26 (×3): qty 1

## 2020-01-26 MED ORDER — SODIUM CHLORIDE 0.9 % IV SOLN
100.0000 mg | Freq: Every day | INTRAVENOUS | Status: DC
  Administered 2020-01-27: 100 mg via INTRAVENOUS
  Filled 2020-01-26 (×2): qty 20

## 2020-01-26 MED ORDER — ASPIRIN 81 MG PO CHEW
81.0000 mg | CHEWABLE_TABLET | Freq: Every day | ORAL | Status: DC
Start: 2020-01-26 — End: 2020-01-28
  Administered 2020-01-26: 81 mg via ORAL
  Filled 2020-01-26 (×2): qty 1

## 2020-01-26 MED ORDER — MIDODRINE HCL 5 MG PO TABS
10.0000 mg | ORAL_TABLET | Freq: Two times a day (BID) | ORAL | Status: DC
  Administered 2020-01-26 – 2020-01-27 (×3): 10 mg via ORAL
  Filled 2020-01-26 (×6): qty 2

## 2020-01-26 NOTE — ED Notes (Signed)
CRITICAL VALUE ALERT  Critical Value:  Trop 121  Date & Time Notied:  01/26/2020 2336  Provider Notified: Dr. Dyann Kief  Orders Received/Actions taken: see chart

## 2020-01-26 NOTE — Progress Notes (Signed)
Patient seen and examined.  Admitted after midnight secondary to shortness of breath and hypoxia.  Work-up demonstrating Covid 19 pneumonia and superimposed bacterial pneumonia.  Patient still short winded with minimal exertion and having positive rhonchi bilaterally.  No using accessory muscles.  He denies chest pain, vital signs are otherwise stable.  Please refer to H&P written by Dr. Clearence Ped for further info/details on admission.  Plan: -Continue broad-spectrum antibiotics -Continue remdesivir and steroids -Follow nephrology recommendations for dialysis continuation while inpatient -Continue supportive care, proning position and wean of oxygen supplementation as tolerated -Will also continue as needed antitussive medications. -Vitamin C and zinc will be added to his regimen.   Barton Dubois MD [phone]

## 2020-01-26 NOTE — ED Notes (Signed)
Midodrine not available in ED pyxis. HS called.

## 2020-01-26 NOTE — ED Notes (Signed)
Per Santiago Glad in pharmacy, pt is not to receive the 200mg  remdesivr

## 2020-01-26 NOTE — ED Notes (Signed)
Pt was offered lunch tray but declined stating "I am not hungry right now."

## 2020-01-26 NOTE — ED Notes (Signed)
Pt given breakfast tray

## 2020-01-26 NOTE — H&P (Signed)
TRH H&P    Patient Demographics:    Jesse Murphy, is a 86 y.o. male  MRN: [id number]  DOB - [birth year]  Admit Date - 02/13/2020  Referring MD/NP/PA: Regenia Skeeter  Outpatient Primary MD for the patient is Leeanne Rio, MD  Patient coming from: dialysis  Chief complaint- dyspnea   HPI:    Jesse Murphy  is a 86 y.o. male, with history of heparin-induced thrombocytopenia, Pseudomonas pneumonia, paroxysmal atrial fibrillation on amiodarone, myocardial infarction, ischemic cardiomyopathy, hypertension, end-stage renal disease on dialysis Monday Wednesday Friday, and more presents to the ER with a chief complaint of dyspnea.  Patient reports that shortness of breath started at 6:10 PM on 01/16/2020.  Reports that he has had a productive cough associated that it had yellow sputum.  He denies any fever, body aches, congestion.  Patient still does make urine and reports he has had no dysuria, but he is chronically incontinent.  He reports his dyspnea was better with oxygen that they put on him at dialysis.  He reports is better now with oxygen on here.  Reports that this happened night before last at his last dialysis appointment as well.  He has no chest pain, no palpitations.  He is vaccinated for Covid.  Patient is full code.  Patient has no other complaints at this time.  In the ED Temp 97.7, heart rate 99 208, respiratory rate 21-28, blood pressure 115/59 ED provider explains further that patient had dyspnea on day before yesterday at dialysis EMS transported to an ER and they reported that he had A. fib with RVR in route, but it was resolved by the time they reached the ER.  Patient was no longer dyspneic and did not have hypoxia at that time so he was discharged home. This time the difference was he was still in A. fib with RVR upon arrival to the ER with a rate of 131 on EKG, and he was hypoxic requiring 7 L with EMS  titrated down to 2 L in the ER. No leukocytosis with a white blood cell count of 6.3, hemoglobin 8.8 Chemistry panel reveals a BUN of 31 and a creatinine of 2.62 in the setting of known ESRD BNP was 1437, LDH 234, D-dimer 2.41, fibrinogen 756 Covid positive Blood cultures pending Chest x-ray showed small left pleural effusion with basilar airspace disease concerning for pneumonia EKG shows a flutter with rate of 131 QTC 470 Patient was started on cefepime and bank Admission requested for further work-up of acute hypoxic respiratory failure and pneumonia    Review of systems:    In addition to the HPI above,  No Fever-chills, No Headache, No changes with Vision or hearing, No problems swallowing food or Liquids, No Chest pain, admits to cough and shortness of breath No Abdominal pain, No Nausea or Vomiting, bowel movements are regular, No Blood in stool or Urine, No dysuria, No new skin rashes or bruises, No new joints pains-aches,  No new weakness, tingling, numbness in any extremity, No recent weight gain or loss, No polyuria, polydypsia  or polyphagia, No significant Mental Stressors.  All other systems reviewed and are negative.    Past History of the following :    Past Medical History:  Diagnosis Date  . Anemia   . CAD (coronary artery disease)    a. s/p PCI in 1992 b. Coronary CT in 01/2018 showing extensive coronary calcification; FFR indeterminate --> medical management pursued at that time as patient not interested in repeat cath. c. s/p CABGx3 in 02/2019.  . Cancer [facility])    prostate  . Cardiogenic shock ([place])   . CHB (complete heart block) (HCC)    a. due to vagal event in 2021, requiring temp pacer.  . Chronic combined systolic and diastolic CHF (congestive heart failure) ([place])   . Debilitated   . Decubitus ulcer of coccyx, unspecified pressure ulcer stage   . Dyspnea   . End stage renal disease ([place])   . Esophagitis   . H/O tracheostomy    a. during admit  in 2021 for respiratory failure.  . Hematuria   . Hypertension   . Hypoalbuminemia   . Ischemic cardiomyopathy   . Myocardial infarction ([place])   . PAF (paroxysmal atrial fibrillation) ([place])   . 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 Pleural effusion, left    a. s/p pleurX catheter  . Pressure ulcer of both heels   . Pseudomonas pneumonia ([place])   . Thrombocytopenia ([place])   . Walker as ambulation aid    also uses wheelchair      Past Surgical History:  Procedure Laterality Date  . BASCILIC VEIN TRANSPOSITION Right 09/10/2019   Procedure: RIGHT ARM 1ST STAGE BASCILIC VEIN TRANSPOSITION;  Surgeon: Angelia Mould, MD;  Location: [place];  Service: Vascular;  Laterality: Right;  . BASCILIC VEIN TRANSPOSITION Right 11/06/2019   Procedure: RIGHT UPPER EXTREMITY SECOND STAGE BASCILIC VEIN TRANSPOSITION;  Surgeon: Angelia Mould, MD;  Location: [place];  Service: Vascular;  Laterality: Right;  . BIOPSY  10/15/2019   Procedure: BIOPSY;  Surgeon: Daneil Dolin, MD;  [facility];  Service: Endoscopy;;  Distal Rectal Ulceration biopsies   . CHEST TUBE INSERTION Left 04/23/2019   Procedure: INSERTION PLEURAL DRAINAGE CATHETER TO DRAIN LEFT PLEURAL EFFUSION;  Surgeon: Ivin Poot, MD;  Location: [place];  Service: Thoracic;  Laterality: Left;  . CLIPPING OF ATRIAL APPENDAGE N/A 03/16/2019   Procedure: CLIPPING OF LEFT  ATRIAL APPENDAGE - USING ATRICLIP SIZE 40;  Surgeon: Ivin Poot, MD;  Location: [place];  Service: Open Heart Surgery;  Laterality: N/A;  . COLONOSCOPY N/A 08/25/2017   Procedure: COLONOSCOPY;  Surgeon: Rogene Houston, MD;  [facility];  Service: Endoscopy;  Laterality: N/A;  . CORONARY ARTERY BYPASS GRAFT N/A 03/16/2019   Procedure: CORONARY ARTERY BYPASS GRAFTING (CABG), ON PUMP, TIMES THREE, USING LEFT INTERNAL MAMMARY ARTERY, RIGHT GREATER SAPHENOUS VEIN HARVESTED ENDOSCOPICALLY;  Surgeon: Ivin Poot, MD;  Location: [place];  Service: Open Heart  Surgery;  Laterality: N/A;  swan only  . CYSTOSCOPY WITH FULGERATION N/A 04/30/2019   Procedure: CYSTOSCOPY WITH FULGERATION;  Surgeon: Irine Seal, MD;  Location: [place];  Service: Urology;  Laterality: N/A;  . FLEXIBLE SIGMOIDOSCOPY N/A 10/15/2019   Procedure: FLEXIBLE SIGMOIDOSCOPY with propofol;  Surgeon: Daneil Dolin, MD;  [facility];  Service: Endoscopy;  Laterality: N/A;  . FLEXIBLE SIGMOIDOSCOPY N/A 11/24/2019   Procedure: FLEXIBLE SIGMOIDOSCOPY;  Surgeon: Harvel Quale, MD;  [facility];  Service: Gastroenterology;  Laterality:  N/A;  . HERNIA REPAIR    . IR FLUORO GUIDE CV LINE LEFT  04/23/2019  . IR FLUORO GUIDE CV LINE RIGHT  05/24/2019  . IR US GUIDE VASC ACCESS LEFT  04/23/2019  . MAZE N/A 03/16/2019   Procedure: MAZE;  Surgeon: Ivin Poot, MD;  Location: [place];  Service: Open Heart Surgery;  Laterality: N/A;  . PLACEMENT OF IMPELLA LEFT VENTRICULAR ASSIST DEVICE Right 03/27/2019   Procedure: Placement Of Impella Left Ventricular Assist Device using ABIOMED Impella 5.5 with SmartAssist Device.;  Surgeon: Wonda Olds, MD;  [facility];  Service: Thoracic;  Laterality: Right;  . POLYPECTOMY  11/24/2019   Procedure: POLYPECTOMY;  Surgeon: Harvel Quale, MD;  [facility];  Service: Gastroenterology;;  sigmoid  . PROSTATE SURGERY    . REMOVAL OF IMPELLA LEFT VENTRICULAR ASSIST DEVICE N/A 04/10/2019   Procedure: REMOVAL OF IMPELLA LEFT VENTRICULAR ASSIST DEVICE WITH INSERTION OF RIGHT FEMORAL ARTERIAL LINE;  Surgeon: Ivin Poot, MD;  Location: [place];  Service: Open Heart Surgery;  Laterality: N/A;  . REMOVAL OF PLEURAL DRAINAGE CATHETER Left 07/02/2019   Procedure: REMOVAL OF PLEURAL DRAINAGE CATHETER;  Surgeon: Ivin Poot, MD;  Location: [place];  Service: Thoracic;  Laterality: Left;  . RIGHT/LEFT HEART CATH AND CORONARY ANGIOGRAPHY N/A 03/15/2019   Procedure: RIGHT/LEFT HEART CATH AND CORONARY ANGIOGRAPHY;   Surgeon: Burnell Blanks, MD;  Location: [facility];  Service: Cardiovascular;  Laterality: N/A;  . TEE WITHOUT CARDIOVERSION N/A 03/16/2019   Procedure: TRANSESOPHAGEAL ECHOCARDIOGRAM (TEE);  Surgeon: Prescott Gum, Collier Salina, MD;  Location: [place];  Service: Open Heart Surgery;  Laterality: N/A;  . TRACHEOSTOMY TUBE PLACEMENT N/A 03/27/2019   Procedure: TRACHEOSTOMY placed using Shiley 8DCT Cuffed.;  Surgeon: Wonda Olds, MD;  [facility];  Service: Thoracic;  Laterality: N/A;  . TRANSURETHRAL RESECTION OF BLADDER NECK N/A 05/13/2019   Procedure: CYSTOSCOPY CLOT EVACUATION FULGRATION CYSTOGRAM AND INSTILLATION OF FORMALIN;  Surgeon: Lucas Mallow, MD;  Location: [place];  Service: Urology;  Laterality: N/A;      Social History:      Social History   Tobacco Use  . Smoking status: Former Smoker    Packs/day: 0.25    Years: 50.00    Pack years: 12.50    Types: Cigarettes    Quit date: 02/15/1990    Years since quitting: 29.9  . Smokeless tobacco: Never Used  Substance Use Topics  . Alcohol use: No       Family History :     Family History  Problem Relation Age of Onset  . CAD Mother 44  . CAD Father 22  . CAD Sister   . CAD Brother       Home Medications:   Prior to Admission medications   Medication Sig Start Date End Date Taking? Authorizing Provider  acetaminophen (TYLENOL) 500 MG tablet Take 500 mg by mouth every 6 (six) hours as needed for moderate pain.    [provider]  acetaminophen (TYLENOL) 650 MG CR tablet Take 650 mg by mouth every 8 (eight) hours as needed for pain.    [provider]  amiodarone (PACERONE) 200 MG tablet Take 1 tablet (200 mg total) by mouth See admin instructions. oral amiodarone 200mg  bid x 4 weeks, then 200mg  daily after that Patient taking differently: Take 200 mg by mouth daily.  12/12/19   Roxan Hockey, MD  aspirin 81 MG chewable tablet Chew 81  mg by mouth in the morning.  05/21/19   [provider]  b complex-vitamin c-folic acid (NEPHRO-VITE) 0.8 MG TABS tablet Take 1 tablet by mouth at bedtime.    [provider]  carboxymethylcellulose (REFRESH PLUS) 0.5 % SOLN Place 1 drop into both eyes 4 (four) times daily. 10/08/19   [provider]  clotrimazole (MYCELEX) 10 MG troche Take 10 mg by mouth 2 (two) times daily as needed.     [provider]  Ensure Plus (ENSURE PLUS) LIQD Take 237 mLs by mouth 2 (two) times daily between meals.    [provider]  midodrine (PROAMATINE) 10 MG tablet Take 10 mg by mouth 2 (two) times daily.  10/08/19 10/07/20  [provider]  nystatin ointment (MYCOSTATIN) Apply 1 application topically daily as needed (Apply to Penis as needed if itching occurs.).  06/22/19   [provider]  ondansetron (ZOFRAN) 4 MG tablet Take 4 mg by mouth every 8 (eight) hours as needed for nausea or vomiting.    [provider]  pantoprazole (PROTONIX) 40 MG tablet Take 1 tablet (40 mg total) by mouth daily. 12/12/19 12/11/20  Roxan Hockey, MD  polyethylene glycol (MIRALAX / GLYCOLAX) 17 g packet Take 17 g by mouth 2 (two) times daily. 12/12/19   Roxan Hockey, MD  predniSONE (DELTASONE) 10 MG tablet Take 40 mg by mouth daily.  02/07/2020   [provider]  senna-docusate (SENOKOT-S) 8.6-50 MG tablet Take 2 tablets by mouth 2 (two) times daily. 12/12/19 12/11/20  Roxan Hockey, MD  Calcium Carb-Cholecalciferol (CALCIUM CARBONATE-VITAMIN D3) 600-400 MG-UNIT TABS Take 1 tablet by mouth daily. Patient not taking: Reported on 01/23/2020  01/23/20  [provider]     Allergies:     Allergies  Allergen Reactions  . Atorvastatin Anaphylaxis and Rash    Leg pain  . Adhesive [Tape]     Tears skin  . Reglan [Metoclopramide]     Rash (see 05/2019 hospital notes), but was on other medications at the time so unclear which agent  . Heparin Rash    Thrombocytopenia/ HIT     Physical Exam:    Vitals  Blood pressure (!) 106/58, pulse 93, temperature 97.7 F (36.5 C), temperature source Oral, resp. rate (!) 23, height 5\' 7"  (1.702 m), weight 66.7 kg, SpO2 99 %.  1.  General: In a supine in bed with head of bed elevated  2. Psychiatric: Very pleasant cooperative with exam, alert and oriented x3, mood and behavior normal for situation  3. Neurologic: Nerves II to XII are grossly intact, equal sensation in the upper and lower extremities, speech and language normal, no focal deficit on limited exam  4. HEENMT:  Normocephalic, pupils reactive to light, neck is supple, trachea is midline, mucous membranes are moist  5. Respiratory : Lungs are clear to auscultation with diminished breath sounds in the lower lung fields, but no wheeze rhonchi or rales  6. Cardiovascular : Heart rate is normal rhythm is irregular, no rubs or gallops peripheral edema present  7. Gastrointestinal:  Nondistended, nontender to palpation  8. Skin:  Extensive bruising over chest and right arm, patient does not know where that started and reports there is no pain, no other acute lesions on limited exam  9.Musculoskeletal:  No acute deformity no calf tenderness    Data Review:    CBC Recent Labs  Lab 01/23/20 2227 01/24/2020 2030  WBC 5.7 6.3  HGB 9.8* 8.8*  HCT 33.7* 30.7*  PLT 90* 95*  MCV 109.8* 111.2*  MCH 31.9 31.9  MCHC 29.1* 28.7*  RDW 20.6* 20.3*  LYMPHSABS 0.1* 0.1*  MONOABS 0.3 0.3  EOSABS 0.0 0.0  BASOSABS 0.0 0.0   ------------------------------------------------------------------------------------------------------------------  Results for orders placed or performed during the hospital encounter of 01/21/2020 (from the past 48 hour(s))  Resp Panel by RT-PCR (Flu A&B, Covid) Nasopharyngeal Swab     Status: Abnormal   Collection Time: 01/27/2020  7:17 PM   Specimen: Nasopharyngeal Swab; Nasopharyngeal(NP) swabs in vial transport medium  Result Value Ref Range   SARS  Coronavirus 2 by RT PCR POSITIVE (A) NEGATIVE    Comment: RESULT CALLED TO, READ BACK BY AND VERIFIED WITH: C TURNER AT 2041 ON 02/07/2020 BY MOSLEY,J (NOTE) SARS-CoV-2 target nucleic acids are DETECTED.  The SARS-CoV-2 RNA is generally detectable in upper respiratory specimens during the acute phase of infection. Positive results are indicative of the presence of the identified virus, but do not rule out bacterial infection or co-infection with other pathogens not detected by the test. Clinical correlation with patient history and other diagnostic information is necessary to determine patient infection status. The expected result is Negative.  Fact Sheet for Patients: EntrepreneurPulse.com.au  Fact Sheet for Healthcare Providers: IncredibleEmployment.be  This test is not yet approved or cleared by the [place] FDA and  has been authorized for detection and/or diagnosis of SARS-CoV-2 by FDA under an Emergency Use Authorization (EUA).  This EUA will remain in effect (meaning this te st can be used) for the duration of  the COVID-19 declaration under Section 564(b)(1) of the Act, 21 U.S.C. section 360bbb-3(b)(1), unless the authorization is terminated or revoked sooner.     Influenza A by PCR NEGATIVE NEGATIVE   Influenza B by PCR NEGATIVE NEGATIVE    Comment: (NOTE) The Xpert Xpress SARS-CoV-2/FLU/RSV plus assay is intended as an aid in the diagnosis of influenza from Nasopharyngeal swab specimens and should not be used as a sole basis for treatment. Nasal washings and aspirates are unacceptable for Xpert Xpress SARS-CoV-2/FLU/RSV testing.  Fact Sheet for Patients: EntrepreneurPulse.com.au  Fact Sheet for Healthcare Providers: IncredibleEmployment.be  This test is not yet approved or cleared by the [place] FDA and has been authorized for detection and/or diagnosis of SARS-CoV-2 by FDA under  an Emergency Use Authorization (EUA). This EUA will remain in effect (meaning this test can be used) for the duration of the COVID-19 declaration under Section 564(b)(1) of the Act, 21 U.S.C. section 360bbb-3(b)(1), unless the authorization is terminated or revoked.  Performed at [facility], [address], [place], [place] [address]   Brain natriuretic peptide     Status: Abnormal   Collection Time: 02/06/2020  8:30 PM  Result Value Ref Range   B Natriuretic Peptide 1,437.0 (H) 0.0 - 100.0 pg/mL    Comment: Performed at [facility], [address] [place], [place] [address]  Lactic acid, plasma     Status: Abnormal   Collection Time: 01/29/2020  8:30 PM  Result Value Ref Range   Lactic Acid, Venous 4.4 (HH) 0.5 - 1.9 mmol/L    Comment: CRITICAL RESULT CALLED TO, READ BACK BY AND VERIFIED WITH: TURNER,C ON 02/13/2020 AT 2125 BY LOY,C Performed at [facility], [address], [place], [place] [address]   CBC WITH DIFFERENTIAL     Status: Abnormal   Collection Time: 01/27/2020  8:30 PM  Result Value Ref Range   WBC 6.3 4.0 - 10.5 K/uL   RBC 2.76 (L)  4.22 - 5.81 MIL/uL   Hemoglobin 8.8 (L) 13.0 - 17.0 g/dL   HCT 30.7 (L) 39.0 - 52.0 %   MCV 111.2 (H) 80.0 - 100.0 fL   MCH 31.9 26.0 - 34.0 pg   MCHC 28.7 (L) 30.0 - 36.0 g/dL   RDW 20.3 (H) 11.5 - 15.5 %   Platelets 95 (L) 150 - 400 K/uL    Comment: CONSISTENT WITH PREVIOUS RESULT   nRBC 1.7 (H) 0.0 - 0.2 %   Neutrophils Relative % 93 %   Neutro Abs 5.9 1.7 - 7.7 K/uL   Lymphocytes Relative 2 %   Lymphs Abs 0.1 (L) 0.7 - 4.0 K/uL   Monocytes Relative 4 %   Monocytes Absolute 0.3 0.1 - 1.0 K/uL   Eosinophils Relative 0 %   Eosinophils Absolute 0.0 0.0 - 0.5 K/uL   Basophils Relative 0 %   Basophils Absolute 0.0 0.0 - 0.1 K/uL   Immature Granulocytes 1 %   Abs Immature Granulocytes 0.08 (H) 0.00 - 0.07 K/uL    Comment: Performed at [facility], [address] [place], [place] [address]  Comprehensive metabolic panel     Status:  Abnormal   Collection Time: 02/11/2020  8:30 PM  Result Value Ref Range   Sodium 137 135 - 145 mmol/L   Potassium 4.4 3.5 - 5.1 mmol/L    Comment: DELTA CHECK NOTED   Chloride 94 (L) 98 - 111 mmol/L   CO2 27 22 - 32 mmol/L   Glucose, Bld 128 (H) 70 - 99 mg/dL    Comment: Glucose reference range applies only to samples taken after fasting for at least 8 hours.   BUN 31 (H) 8 - 23 mg/dL   Creatinine, Ser 2.62 (H) 0.61 - 1.24 mg/dL   Calcium 7.9 (L) 8.9 - 10.3 mg/dL   Total Protein 5.7 (L) 6.5 - 8.1 g/dL   Albumin 2.6 (L) 3.5 - 5.0 g/dL   AST 29 15 - 41 U/L   ALT <5 0 - 44 U/L   Alkaline Phosphatase 53 38 - 126 U/L   Total Bilirubin 1.3 (H) 0.3 - 1.2 mg/dL   GFR, Estimated 23 (L) >60 mL/min    Comment: (NOTE) Calculated using the CKD-EPI Creatinine Equation (2021)    Anion gap 16 (H) 5 - 15    Comment: Performed at [facility], [address], [place], [place] [address]  D-dimer, quantitative     Status: Abnormal   Collection Time: 02/13/2020  8:30 PM  Result Value Ref Range   D-Dimer, Quant 2.41 (H) 0.00 - 0.50 ug/mL-FEU    Comment: (NOTE) At the manufacturer cut-off value of 0.5 g/mL FEU, this assay has a negative predictive value of 95-100%.This assay is intended for use in conjunction with a clinical pretest probability (PTP) assessment model to exclude pulmonary embolism (PE) and deep venous thrombosis (DVT) in outpatients suspected of PE or DVT. Results should be correlated with clinical presentation. Performed at [facility] [address] [place], [place] [address]   Procalcitonin     Status: None   Collection Time: 01/29/2020  8:30 PM  Result Value Ref Range   Procalcitonin 5.50 ng/mL    Comment:        Interpretation: PCT > 2 ng/mL: Systemic infection (sepsis) is likely, unless other causes are known. (NOTE)       Sepsis PCT Algorithm           Lower Respiratory Tract  Infection PCT Algorithm    ----------------------------      ----------------------------         PCT < 0.25 ng/mL                PCT < 0.10 ng/mL          Strongly encourage             Strongly discourage   discontinuation of antibiotics    initiation of antibiotics    ----------------------------     -----------------------------       PCT 0.25 - 0.50 ng/mL            PCT 0.10 - 0.25 ng/mL               OR       >80% decrease in PCT            Discourage initiation of                                            antibiotics      Encourage discontinuation           of antibiotics    ----------------------------     -----------------------------         PCT >= 0.50 ng/mL              PCT 0.26 - 0.50 ng/mL               AND       <80% decrease in PCT              Encourage initiation of                                             antibiotics       Encourage continuation           of antibiotics    ----------------------------     -----------------------------        PCT >= 0.50 ng/mL                  PCT > 0.50 ng/mL               AND         increase in PCT                  Strongly encourage                                      initiation of antibiotics    Strongly encourage escalation           of antibiotics                                     -----------------------------                                           PCT <= 0.25 ng/mL  OR                                        > 80% decrease in PCT                                      Discontinue / Do not initiate                                             antibiotics  Performed at [facility], [address] [place], [place] [address]   Lactate dehydrogenase     Status: Abnormal   Collection Time: 01/20/2020  8:30 PM  Result Value Ref Range   LDH 234 (H) 98 - 192 U/L    Comment: Performed at [facility], [address], [place], [place] [address]  Ferritin     Status: Abnormal   Collection Time: 02/14/2020  8:30 PM  Result Value Ref Range    Ferritin 1,529 (H) 24 - 336 ng/mL    Comment: Performed at [facility], [address] [place], [place] [address]  Triglycerides     Status: None   Collection Time: 01/27/2020  8:30 PM  Result Value Ref Range   Triglycerides 101 <150 mg/dL    Comment: Performed at [facility], [address] [place], [place] [address]  Fibrinogen     Status: Abnormal   Collection Time: 02/13/2020  8:30 PM  Result Value Ref Range   Fibrinogen 756 (H) 210 - 475 mg/dL    Comment: Performed at [facility], [address], [place], [place] [address]  C-reactive protein     Status: Abnormal   Collection Time: 01/20/2020  8:30 PM  Result Value Ref Range   CRP 52.5 (H) <1.0 mg/dL    Comment: RESULTS CONFIRMED BY MANUAL DILUTION Performed at [facility], [address] [place], [place] [address]   Troponin I (High Sensitivity)     Status: Abnormal   Collection Time: 02/13/2020  8:30 PM  Result Value Ref Range   Troponin I (High Sensitivity) 81 (H) <18 ng/L    Comment: (NOTE) Elevated high sensitivity troponin I (hsTnI) values and significant  changes across serial measurements may suggest ACS but many other  chronic and acute conditions are known to elevate hsTnI results.  Refer to the Links section for chest pain algorithms and additional  guidance. Performed at [facility], [address] [place], [place] [address]   Lactic acid, plasma     Status: None   Collection Time: 01/26/20 12:30 AM  Result Value Ref Range   Lactic Acid, Venous 1.9 0.5 - 1.9 mmol/L    Comment: Performed at [facility], [address] [place], [place] [address]    Chemistries  Recent Labs  Lab 01/23/20 2227 01/24/2020 2030  NA 135 137  K 3.1* 4.4  CL 92* 94*  CO2 31 27  GLUCOSE 117* 128*  BUN 22 31*  CREATININE 2.13* 2.62*  CALCIUM 7.9* 7.9*  AST  --  29  ALT  --  <5  ALKPHOS  --  53  BILITOT  --  1.3*    ------------------------------------------------------------------------------------------------------------------  ------------------------------------------------------------------------------------------------------------------ GFR: Estimated Creatinine Clearance: 18.2 mL/min (A) (by  C-G formula based on SCr of 2.62 mg/dL (H)). Liver Function Tests: Recent Labs  Lab 02/09/2020 2030  AST 29  ALT <5  ALKPHOS 53  BILITOT 1.3*  PROT 5.7*  ALBUMIN 2.6*   No results for input(s): LIPASE, AMYLASE in the last 168 hours. No results for input(s): AMMONIA in the last 168 hours. Coagulation Profile: No results for input(s): INR, PROTIME in the last 168 hours. Cardiac Enzymes: No results for input(s): CKTOTAL, CKMB, CKMBINDEX, TROPONINI in the last 168 hours. BNP (last 3 results) No results for input(s): PROBNP in the last 8760 hours. HbA1C: No results for input(s): HGBA1C in the last 72 hours. CBG: No results for input(s): GLUCAP in the last 168 hours. Lipid Profile: Recent Labs    02/11/2020 2030  TRIG 101   Thyroid Function Tests: No results for input(s): TSH, T4TOTAL, FREET4, T3FREE, THYROIDAB in the last 72 hours. Anemia Panel: Recent Labs    02/12/2020 2030  FERRITIN 1,529*    --------------------------------------------------------------------------------------------------------------- Urine analysis:    Component Value Date/Time   COLORURINE YELLOW 11/25/2019 1012   APPEARANCEUR TURBID (A) 11/25/2019 1012   LABSPEC 1.010 11/25/2019 1012   PHURINE 6.0 11/25/2019 1012   GLUCOSEU NEGATIVE 11/25/2019 1012   HGBUR SMALL (A) 11/25/2019 1012   BILIRUBINUR NEGATIVE 11/25/2019 1012   KETONESUR NEGATIVE 11/25/2019 1012   PROTEINUR 100 (A) 11/25/2019 1012   NITRITE NEGATIVE 11/25/2019 1012   LEUKOCYTESUR MODERATE (A) 11/25/2019 1012      Imaging Results:    DG Chest Port 1 View  Result Date: 02/11/2020 CLINICAL DATA:  Dyspnea EXAM: PORTABLE CHEST 1 VIEW COMPARISON:   01/23/2020, 12/11/2019, 08/22/2019 FINDINGS: Left-sided central venous catheter with tip over the right atrium. Post sternotomy changes. Similar small left pleural effusion with basilar airspace disease. Stable cardiomediastinal silhouette with aortic atherosclerosis. No pneumothorax. IMPRESSION: Small left pleural effusion with basilar airspace disease, atelectasis versus pneumonia. No significant change compared with radiograph 2 days ago. Electronically Signed   By: Donavan Foil M.D.   On: 01/22/2020 19:47       Assessment & Plan:    Active Problems:   Respiratory failure with hypoxia ([place])   1. Acute hypoxic respiratory failure 1. Likely secondary to Covid and superimposed bacterial pneumonia 2. Continue wean off O2 as tolerated 3. Initial troponin 81 continue to trend 4. EKG without ischemic changes 5. Treat as below and continue to monitor 2. A. fib with RVR 1. Patient on amiodarone in the outpatient setting 2. Continue amiodarone 3. Rate has corrected to 6 with oxygen 4. Continue to monitor on telemetry 5. Likely acute lung disease triggered A. fib 3. Elevated troponin 1. Initial troponin is 81 in the setting of ESRD 2. Continue to trend 3. Patient is chest pain-free, no ischemic changes on EKG 4. Covid pneumonia and bacterial pneumonia 1. Finding on chest x-ray, procalcitonin of 5 2. Continue Vanco and cefepime 3. Continue remdesivir and Decadron 4. Trend inflammatory markers 5. Continue to monitor 5. ESRD 1. Nephrology consult placed for routine dialysis 2. Patient gets dialyzed Monday Wednesday Friday 3. In the dialysis cath and an AV fistula reports they have been using the dialysis catheter 6.    DVT Prophylaxis-hold DVT prophylaxis as patient has history of HIT- SCDs  AM Labs Ordered, also please review Full Orders  Family Communication: No family at bedside Code Status: Full  Admission status:Inpatient :The appropriate admission status for this patient is  INPATIENT. Inpatient status is judged to be reasonable and necessary in order  to provide the required intensity of service to ensure the patient's safety. The patient's presenting symptoms, physical exam findings, and initial radiographic and laboratory data in the context of their chronic comorbidities is felt to place them at high risk for further clinical deterioration. Furthermore, it is not anticipated that the patient will be medically stable for discharge from the hospital within 2 midnights of admission. The following factors support the admission status of inpatient.     The patient's presenting symptoms include dyspnea and hypoxia The worrisome physical exam findings include oxygen on 85% on room air The initial radiographic and laboratory data are worrisome because of pneumonia finding on chest x-ray, Covid positive The chronic co-morbidities include ESRD, A. fib, CAD       * I certify that at the point of admission it is my clinical judgment that the patient will require inpatient hospital care spanning beyond 2 midnights from the point of admission due to high intensity of service, high risk for further deterioration and high frequency of surveillance required.*  Time spent in minutes : [place]

## 2020-01-27 ENCOUNTER — Inpatient Hospital Stay (HOSPITAL_COMMUNITY): Payer: Medicare Other

## 2020-01-27 DIAGNOSIS — I480 Paroxysmal atrial fibrillation: Secondary | ICD-10-CM

## 2020-01-27 LAB — COMPREHENSIVE METABOLIC PANEL
ALT: 16 U/L (ref 0–44)
ALT: 17 U/L (ref 0–44)
AST: 25 U/L (ref 15–41)
AST: 28 U/L (ref 15–41)
Albumin: 1.9 g/dL — ABNORMAL LOW (ref 3.5–5.0)
Albumin: 2 g/dL — ABNORMAL LOW (ref 3.5–5.0)
Alkaline Phosphatase: 50 U/L (ref 38–126)
Alkaline Phosphatase: 56 U/L (ref 38–126)
Anion gap: 15 (ref 5–15)
Anion gap: 18 — ABNORMAL HIGH (ref 5–15)
BUN: 60 mg/dL — ABNORMAL HIGH (ref 8–23)
BUN: 71 mg/dL — ABNORMAL HIGH (ref 8–23)
CO2: 27 mmol/L (ref 22–32)
CO2: 24 mmol/L (ref 22–32)
Calcium: 7.7 mg/dL — ABNORMAL LOW (ref 8.9–10.3)
Calcium: 7.7 mg/dL — ABNORMAL LOW (ref 8.9–10.3)
Chloride: 94 mmol/L — ABNORMAL LOW (ref 98–111)
Chloride: 94 mmol/L — ABNORMAL LOW (ref 98–111)
Creatinine, Ser: 3.68 mg/dL — ABNORMAL HIGH (ref 0.61–1.24)
Creatinine, Ser: 4.03 mg/dL — ABNORMAL HIGH (ref 0.61–1.24)
GFR, Estimated: 15 mL/min — ABNORMAL LOW (ref >60)
GFR, Estimated: 14 mL/min — ABNORMAL LOW (ref >60)
Glucose, Bld: 93 mg/dL (ref 70–99)
Glucose, Bld: 101 mg/dL — ABNORMAL HIGH (ref 70–99)
Potassium: 4.4 mmol/L (ref 3.5–5.1)
Potassium: 5 mmol/L (ref 3.5–5.1)
Sodium: 136 mmol/L (ref 135–145)
Sodium: 136 mmol/L (ref 135–145)
Total Bilirubin: 0.9 mg/dL (ref 0.3–1.2)
Total Bilirubin: 0.9 mg/dL (ref 0.3–1.2)
Total Protein: 4.7 g/dL — ABNORMAL LOW (ref 6.5–8.1)
Total Protein: 4.9 g/dL — ABNORMAL LOW (ref 6.5–8.1)

## 2020-01-27 LAB — BLOOD CULTURE ID PANEL (REFLEXED) - BCID2

## 2020-01-27 LAB — CBC WITH DIFFERENTIAL/PLATELET
Band Neutrophils: 17 %
Basophils Absolute: 0 10*3/uL (ref 0.0–0.1)
Basophils Relative: 0 %
Eosinophils Absolute: 0 10*3/uL (ref 0.0–0.5)
Eosinophils Relative: 0 %
HCT: 26.2 % — ABNORMAL LOW (ref 39.0–52.0)
Hemoglobin: 7.6 g/dL — ABNORMAL LOW (ref 13.0–17.0)
Lymphocytes Relative: 1 %
Lymphs Abs: 0.1 10*3/uL — ABNORMAL LOW (ref 0.7–4.0)
MCH: 31.3 pg (ref 26.0–34.0)
MCHC: 29 g/dL — ABNORMAL LOW (ref 30.0–36.0)
MCV: 107.8 fL — ABNORMAL HIGH (ref 80.0–100.0)
Metamyelocytes Relative: 3 %
Monocytes Absolute: 0.2 10*3/uL (ref 0.1–1.0)
Monocytes Relative: 2 %
Neutro Abs: 7.3 10*3/uL (ref 1.7–7.7)
Neutrophils Relative %: 77 %
Platelets: 73 10*3/uL — ABNORMAL LOW (ref 150–400)
RBC: 2.43 MIL/uL — ABNORMAL LOW (ref 4.22–5.81)
RDW: 19.9 % — ABNORMAL HIGH (ref 11.5–15.5)
WBC: 7.8 10*3/uL (ref 4.0–10.5)
nRBC: 0.6 % — ABNORMAL HIGH (ref 0.0–0.2)

## 2020-01-27 LAB — CBC
HCT: 27.9 % — ABNORMAL LOW (ref 39.0–52.0)
Hemoglobin: 8 g/dL — ABNORMAL LOW (ref 13.0–17.0)
MCH: 31.3 pg (ref 26.0–34.0)
MCHC: 28.7 g/dL — ABNORMAL LOW (ref 30.0–36.0)
MCV: 109 fL — ABNORMAL HIGH (ref 80.0–100.0)
Platelets: 79 10*3/uL — ABNORMAL LOW (ref 150–400)
RBC: 2.56 MIL/uL — ABNORMAL LOW (ref 4.22–5.81)
RDW: 20.1 % — ABNORMAL HIGH (ref 11.5–15.5)
WBC: 10.1 10*3/uL (ref 4.0–10.5)
nRBC: 0.8 % — ABNORMAL HIGH (ref 0.0–0.2)

## 2020-01-27 LAB — BLOOD GAS, ARTERIAL
Acid-base deficit: 1 mmol/L (ref 0.0–2.0)
Bicarbonate: 23.3 mmol/L (ref 20.0–28.0)
FIO2: 100
O2 Saturation: 97.8 %
Patient temperature: 36.6
pCO2 arterial: 47.4 mmHg (ref 32.0–48.0)
pH, Arterial: 7.327 — ABNORMAL LOW (ref 7.350–7.450)
pO2, Arterial: 126 mmHg — ABNORMAL HIGH (ref 83.0–108.0)

## 2020-01-27 LAB — TROPONIN I (HIGH SENSITIVITY): Troponin I (High Sensitivity): 93 ng/L — ABNORMAL HIGH (ref <18)

## 2020-01-27 LAB — MAGNESIUM: Magnesium: 1.9 mg/dL (ref 1.7–2.4)

## 2020-01-27 LAB — C-REACTIVE PROTEIN: CRP: 54 mg/dL — ABNORMAL HIGH (ref <1.0)

## 2020-01-27 LAB — D-DIMER, QUANTITATIVE: D-Dimer, Quant: 3.06 ug/mL-FEU — ABNORMAL HIGH (ref 0.00–0.50)

## 2020-01-27 LAB — APTT: aPTT: 43 seconds — ABNORMAL HIGH (ref 24–36)

## 2020-01-27 LAB — GLUCOSE, CAPILLARY: Glucose-Capillary: 90 mg/dL (ref 70–99)

## 2020-01-27 LAB — FERRITIN: Ferritin: 1750 ng/mL — ABNORMAL HIGH (ref 24–336)

## 2020-01-27 LAB — PHOSPHORUS: Phosphorus: 6.5 mg/dL — ABNORMAL HIGH (ref 2.5–4.6)

## 2020-01-27 MED ORDER — MIDAZOLAM HCL 2 MG/2ML IJ SOLN: 1.0000 mg | Freq: Once | INTRAMUSCULAR | Status: DC

## 2020-01-27 MED ORDER — PHENYLEPHRINE HCL-NACL 10-0.9 MG/250ML-% IV SOLN
0.0000 ug/min | INTRAVENOUS | Status: DC
  Administered 2020-01-27: 20 ug/min via INTRAVENOUS

## 2020-01-27 MED ORDER — MIDAZOLAM HCL 2 MG/2ML IJ SOLN: 0.5000 mg | Freq: Once | INTRAMUSCULAR | Status: DC

## 2020-01-27 MED ORDER — AMIODARONE HCL IN DEXTROSE 360-4.14 MG/200ML-% IV SOLN
INTRAVENOUS | Status: AC
  Administered 2020-01-27: 150 mg via INTRAVENOUS
  Filled 2020-01-27: qty 200

## 2020-01-27 MED ORDER — ADENOSINE 6 MG/2ML IV SOLN
6.0000 mg | Freq: Once | INTRAVENOUS | Status: AC
  Administered 2020-01-27: 6 mg via INTRAVENOUS

## 2020-01-27 MED ORDER — CHLORHEXIDINE GLUCONATE CLOTH 2 % EX PADS
6.0000 | MEDICATED_PAD | Freq: Every day | CUTANEOUS | Status: DC
  Administered 2020-01-27 – 2020-01-28 (×2): 6 via TOPICAL

## 2020-01-27 MED ORDER — AMIODARONE HCL IN DEXTROSE 360-4.14 MG/200ML-% IV SOLN: 30.0000 mg/h | INTRAVENOUS | Status: DC

## 2020-01-27 MED ORDER — DILTIAZEM HCL 25 MG/5ML IV SOLN
10.0000 mg | Freq: Once | INTRAVENOUS | Status: AC
  Administered 2020-01-27: 10 mg via INTRAVENOUS

## 2020-01-27 MED ORDER — MIDAZOLAM HCL 2 MG/2ML IJ SOLN
INTRAMUSCULAR | Status: AC
  Administered 2020-01-27: 1 mg via INTRAVENOUS
  Filled 2020-01-27: qty 2

## 2020-01-27 MED ORDER — AMIODARONE HCL IN DEXTROSE 360-4.14 MG/200ML-% IV SOLN
60.0000 mg/h | INTRAVENOUS | Status: AC
  Administered 2020-01-26 – 2020-01-27 (×2): 60 mg/h via INTRAVENOUS
  Filled 2020-01-27: qty 200

## 2020-01-27 MED ORDER — AMIODARONE IV BOLUS ONLY 150 MG/100ML: 150.0000 mg | Freq: Once | INTRAVENOUS | Status: AC

## 2020-01-27 MED ORDER — AMIODARONE LOAD VIA INFUSION
150.0000 mg | Freq: Once | INTRAVENOUS | Status: AC
  Filled 2020-01-27: qty 83.34

## 2020-01-27 MED ORDER — ARGATROBAN 50 MG/50ML IV SOLN
1.0000 ug/kg/min | INTRAVENOUS | Status: DC
  Administered 2020-01-27: 1 ug/kg/min via INTRAVENOUS
  Filled 2020-01-27 (×2): qty 50

## 2020-01-27 MED ORDER — MIDAZOLAM HCL 2 MG/2ML IJ SOLN: 1.0000 mg | Freq: Once | INTRAMUSCULAR | Status: AC

## 2020-01-27 MED ORDER — ADENOSINE 6 MG/2ML IV SOLN
12.0000 mg | Freq: Once | INTRAVENOUS | Status: AC
  Administered 2020-01-27: 12 mg via INTRAVENOUS

## 2020-01-27 NOTE — Progress Notes (Signed)
At approximately 1822, the patient's heart rate increased from 80s to the 140s A-fib RVR and sustained. Dr. Dyann Kief, MD  was contacted via secure chat and made aware. Orders were put in by MD to give a bolus of Amiodarone which was started at 1825. This bolus had no effect and the patient's blood pressure dropped to 84/59.  At this time, Dr. Roderic Palau was in the room.  EKG completed Code cart in room and pads attached to patient. Family contacted and en route to the hospital. 1900- 6mg  of Adenosine was ordered and given, unsuccessful. 1905- 10mg  cardizem ordered and given, unsuccessful. 1940- Phenylephrine ordered and started at 57mcg/min. 1955- 12mg  of Adensoine ordered and given, unsuccessful. 1958- 1mg  Versed ordered and given in preparation for cardioversion.  2000- Zoll synched to cardiovert at 50 J, successful. After cardiversion, patient's heart rate returned to normal sinus rhythm in the 80s. 2005- Neo titrated down to 102mcg/min, blood pressures normalizing to 90s/50s with MAPs in the 70s. 2015- Neo stopped. Blood pressure 122/51.  Patient maintained stable vitals post cardioversion and completion of Neo. Bedside report given to [facility], Therapist, sports.

## 2020-01-27 NOTE — Progress Notes (Signed)
Called to bedside at shift change. Patient had been in Afib RVR with hypotension. Prior to my arrival day team had given adenosine, and cardizem without improvement. He was started on neo. A second dose of adenosine was attempted and briefly brought heart rate down to 110, but then right back up to 130. It was decided to cardiovert, which was successful with first shock. Patient's O2 sats were stable during this time  On HFNC and NRB. He was given versed for the cardioversion, and remained unresponsive to voice afterwards. Patient was still protecting his airway. Family had been called and determined that patient would not want to be full code at this point, and then they came to bedside. Family reports to me that they would like to take patient home on hospice. They report that they already have a hospital bed and would like to know what has to be done to get him home, so he can die at home per his previously known wishes. I have advised that day team will help them address these concerns.  Of note - During this whole event, prior to versed patient had altered mental status and was repeatedly calling out "Help me! Am I dying?" After the versed was given, patient rested for about 3 hours. As of 10pm, starting to wake up, pupils reactive, and nurse reports that he is back to baseline.  Initially there was concern for PE with afib RVR, hypotension, in the setting of Covid. Arg was ordered since patient has history of HIT. Vitals stabilized with cardioversion, and with family considering de-escalation of care, arg was stopped. Proceeding with CTA as it will help to have that data when making a decision re: possible CMO.

## 2020-01-27 NOTE — Progress Notes (Addendum)
ANTICOAGULATION CONSULT NOTE - Initial Consult  Pharmacy Consult for Argatroban Indication: Thromboembolic event  Allergies  Allergen Reactions  . Atorvastatin Anaphylaxis and Rash    Leg pain  . Adhesive [Tape]     Tears skin  . Reglan [Metoclopramide]     Rash (see 05/2019 hospital notes), but was on other medications at the time so unclear which agent  . Heparin Rash    Thrombocytopenia/ HIT    Patient Measurements: Height: 5\' 7"  (170.2 cm) Weight: 66.7 kg (147 lb) IBW/kg (Calculated) : 66.1  Vital Signs: Temp: 97.9 F (36.6 C) (12/12 1800) Temp Source: Oral (12/12 1800) BP: 132/49 (12/12 1600) Pulse Rate: 89 (12/12 1600)  Labs: Recent Labs    01/27/2020 2030 01/26/20 0656 01/27/20 0516  HGB 8.8* 8.0* 7.6*  HCT 30.7* 27.6* 26.2*  PLT 95* 71* 73*  CREATININE 2.62* 2.95* 3.68*  TROPONINIHS 81* 121*  --     Estimated Creatinine Clearance: 13 mL/min (A) (by C-G formula based on SCr of 3.68 mg/dL (H)).   Medical History: Past Medical History:  Diagnosis Date  . Anemia   . CAD (coronary artery disease)    a. s/p PCI in 1992 b. Coronary CT in 01/2018 showing extensive coronary calcification; FFR indeterminate --> medical management pursued at that time as patient not interested in repeat cath. c. s/p CABGx3 in 02/2019.  . Cancer [facility])    prostate  . Cardiogenic shock ([place])   . CHB (complete heart block) ([facility])    a. due to vagal event in 2021, requiring temp pacer.  . Chronic combined systolic and diastolic CHF (congestive heart failure) ([place])   . Debilitated   . Decubitus ulcer of coccyx, unspecified pressure ulcer stage   . Dyspnea   . End stage renal disease ([place])   . Esophagitis   . H/O tracheostomy    a. during admit in 2021 for respiratory failure.  . Hematuria   . Hypertension   . Hypoalbuminemia   . Ischemic cardiomyopathy   . Myocardial infarction ([place])   . PAF (paroxysmal atrial fibrillation) ([place])   . 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 Pleural  effusion, left    a. s/p pleurX catheter  . Pressure ulcer of both heels   . Pseudomonas pneumonia ([place])   . Thrombocytopenia ([place])   . Walker as ambulation aid    also uses wheelchair    Medications:  See med rec  Assessment: 86 y.o.male,with history of heparin-induced thrombocytopenia, Pseudomonas pneumonia, paroxysmal atrial fibrillation on amiodarone, myocardial infarction, ischemic cardiomyopathy, hypertension, end-stage renal disease on dialysis Monday Wednesday Friday, and more presents to the ER with a chief complaint of dyspnea.Acute respiratory failure with hypoxia in the setting of COVID-19 infection and pneumonia. Now with new thromboembolic event. Pharmacy asked to start argatroban. Platelets are 73 and patient has not been on any oral DOACs, heparin. SRA in April is <1  Goal of Therapy:  aPTT 45-95 seconds Monitor platelets by anticoagulation protocol: Yes   Plan:  Start Argatroban at 28mcg/kg/min (66.7kg) Baseline APTT and APTT every 3 hours until patient has 2 consecutive therapeutic APTT, then daily Monitory for S/S bleeding Daily CBC  Isac Sarna, BS Vena [place], BCPS Clinical Pharmacist Pager [phone] 01/27/2020,7:58 PM

## 2020-01-27 NOTE — ED Notes (Signed)
ED TO INPATIENT HANDOFF REPORT  ED Nurse Name and Phone #: [phone]  S Name/Age/Gender Jesse Murphy 86 y.o. male Room/Bed: APA02/APA02  Code Status   Code Status: Full Code  Home/SNF/Other Home Patient oriented to: self Is this baseline? Yes   Triage Complete: Triage complete  Chief Complaint Respiratory failure with hypoxia ([place]) [J96.91]  Triage Note Pt c/o of sob during dialysis, covid +     Allergies Allergies  Allergen Reactions  . Atorvastatin Anaphylaxis and Rash    Leg pain  . Adhesive [Tape]     Tears skin  . Reglan [Metoclopramide]     Rash (see 05/2019 hospital notes), but was on other medications at the time so unclear which agent  . Heparin Rash    Thrombocytopenia/ HIT    Level of Care/Admitting Diagnosis ED Disposition    ED Disposition Condition [facility] Area: [facility] [[id number]]  Level of Care: Telemetry [5]  Covid Evaluation: Confirmed COVID Positive  Diagnosis: Respiratory failure with hypoxia [facility]) [[id number]]  Admitting Physician: Rolla Plate [[id number]]  Attending Physician: Rolla Plate [[id number]]  Estimated length of stay: past midnight tomorrow  Certification:: I certify this patient will need inpatient services for at least 2 midnights       B Medical/Surgery History Past Medical History:  Diagnosis Date  . Anemia   . CAD (coronary artery disease)    a. s/p PCI in 1992 b. Coronary CT in 01/2018 showing extensive coronary calcification; FFR indeterminate --> medical management pursued at that time as patient not interested in repeat cath. c. s/p CABGx3 in 02/2019.  . Cancer [facility])    prostate  . Cardiogenic shock ([place])   . CHB (complete heart block) (HCC)    a. due to vagal event in 2021, requiring temp pacer.  . Chronic combined systolic and diastolic CHF (congestive heart failure) ([place])   . Debilitated   . Decubitus ulcer of coccyx, unspecified pressure ulcer stage   . Dyspnea   . End  stage renal disease ([place])   . Esophagitis   . H/O tracheostomy    a. during admit in 2021 for respiratory failure.  . Hematuria   . Hypertension   . Hypoalbuminemia   . Ischemic cardiomyopathy   . Myocardial infarction ([place])   . PAF (paroxysmal atrial fibrillation) ([place])   . 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 Pleural effusion, left    a. s/p pleurX catheter  . Pressure ulcer of both heels   . Pseudomonas pneumonia ([place])   . Thrombocytopenia ([place])   . Walker as ambulation aid    also uses wheelchair   Past Surgical History:  Procedure Laterality Date  . BASCILIC VEIN TRANSPOSITION Right 09/10/2019   Procedure: RIGHT ARM 1ST STAGE BASCILIC VEIN TRANSPOSITION;  Surgeon: Angelia Mould, MD;  Location: [place];  Service: Vascular;  Laterality: Right;  . BASCILIC VEIN TRANSPOSITION Right 11/06/2019   Procedure: RIGHT UPPER EXTREMITY SECOND STAGE BASCILIC VEIN TRANSPOSITION;  Surgeon: Angelia Mould, MD;  Location: [place];  Service: Vascular;  Laterality: Right;  . BIOPSY  10/15/2019   Procedure: BIOPSY;  Surgeon: Daneil Dolin, MD;  [facility];  Service: Endoscopy;;  Distal Rectal Ulceration biopsies   . CHEST TUBE INSERTION Left 04/23/2019   Procedure: INSERTION PLEURAL DRAINAGE CATHETER TO DRAIN LEFT PLEURAL EFFUSION;  Surgeon: Ivin Poot, MD;  Location: [place];  Service: Thoracic;  Laterality: Left;  . CLIPPING OF ATRIAL APPENDAGE N/A  03/16/2019   Procedure: CLIPPING OF LEFT  ATRIAL APPENDAGE - USING ATRICLIP SIZE 40;  Surgeon: Ivin Poot, MD;  Location: [place];  Service: Open Heart Surgery;  Laterality: N/A;  . COLONOSCOPY N/A 08/25/2017   Procedure: COLONOSCOPY;  Surgeon: Rogene Houston, MD;  [facility];  Service: Endoscopy;  Laterality: N/A;  . CORONARY ARTERY BYPASS GRAFT N/A 03/16/2019   Procedure: CORONARY ARTERY BYPASS GRAFTING (CABG), ON PUMP, TIMES THREE, USING LEFT INTERNAL MAMMARY ARTERY, RIGHT GREATER SAPHENOUS VEIN HARVESTED  ENDOSCOPICALLY;  Surgeon: Ivin Poot, MD;  Location: [place];  Service: Open Heart Surgery;  Laterality: N/A;  swan only  . CYSTOSCOPY WITH FULGERATION N/A 04/30/2019   Procedure: CYSTOSCOPY WITH FULGERATION;  Surgeon: Irine Seal, MD;  Location: [place];  Service: Urology;  Laterality: N/A;  . FLEXIBLE SIGMOIDOSCOPY N/A 10/15/2019   Procedure: FLEXIBLE SIGMOIDOSCOPY with propofol;  Surgeon: Daneil Dolin, MD;  [facility];  Service: Endoscopy;  Laterality: N/A;  . FLEXIBLE SIGMOIDOSCOPY N/A 11/24/2019   Procedure: FLEXIBLE SIGMOIDOSCOPY;  Surgeon: Harvel Quale, MD;  [facility];  Service: Gastroenterology;  Laterality: N/A;  . HERNIA REPAIR    . IR FLUORO GUIDE CV LINE LEFT  04/23/2019  . IR FLUORO GUIDE CV LINE RIGHT  05/24/2019  . IR US GUIDE VASC ACCESS LEFT  04/23/2019  . MAZE N/A 03/16/2019   Procedure: MAZE;  Surgeon: Ivin Poot, MD;  Location: [place];  Service: Open Heart Surgery;  Laterality: N/A;  . PLACEMENT OF IMPELLA LEFT VENTRICULAR ASSIST DEVICE Right 03/27/2019   Procedure: Placement Of Impella Left Ventricular Assist Device using ABIOMED Impella 5.5 with SmartAssist Device.;  Surgeon: Wonda Olds, MD;  [facility];  Service: Thoracic;  Laterality: Right;  . POLYPECTOMY  11/24/2019   Procedure: POLYPECTOMY;  Surgeon: Harvel Quale, MD;  [facility];  Service: Gastroenterology;;  sigmoid  . PROSTATE SURGERY    . REMOVAL OF IMPELLA LEFT VENTRICULAR ASSIST DEVICE N/A 04/10/2019   Procedure: REMOVAL OF IMPELLA LEFT VENTRICULAR ASSIST DEVICE WITH INSERTION OF RIGHT FEMORAL ARTERIAL LINE;  Surgeon: Ivin Poot, MD;  Location: [place];  Service: Open Heart Surgery;  Laterality: N/A;  . REMOVAL OF PLEURAL DRAINAGE CATHETER Left 07/02/2019   Procedure: REMOVAL OF PLEURAL DRAINAGE CATHETER;  Surgeon: Ivin Poot, MD;  Location: [place];  Service: Thoracic;  Laterality: Left;  . RIGHT/LEFT HEART CATH AND CORONARY  ANGIOGRAPHY N/A 03/15/2019   Procedure: RIGHT/LEFT HEART CATH AND CORONARY ANGIOGRAPHY;  Surgeon: Burnell Blanks, MD;  Location: [place] [facility];  Service: Cardiovascular;  Laterality: N/A;  . TEE WITHOUT CARDIOVERSION N/A 03/16/2019   Procedure: TRANSESOPHAGEAL ECHOCARDIOGRAM (TEE);  Surgeon: Prescott Gum, Collier Salina, MD;  Location: [place];  Service: Open Heart Surgery;  Laterality: N/A;  . TRACHEOSTOMY TUBE PLACEMENT N/A 03/27/2019   Procedure: TRACHEOSTOMY placed using Shiley 8DCT Cuffed.;  Surgeon: Wonda Olds, MD;  [facility];  Service: Thoracic;  Laterality: N/A;  . TRANSURETHRAL RESECTION OF BLADDER NECK N/A 05/13/2019   Procedure: CYSTOSCOPY CLOT EVACUATION FULGRATION CYSTOGRAM AND INSTILLATION OF FORMALIN;  Surgeon: Lucas Mallow, MD;  Location: [place];  Service: Urology;  Laterality: N/A;     A IV Location/Drains/Wounds Patient Lines/Drains/Airways Status    Active Line/Drains/Airways    Name Placement date Placement time Site Days   Peripheral IV 01/23/20 Left;Upper Arm 01/23/20  2231  Arm  4   Peripheral IV 01/21/2020 Left Antecubital  02/13/2020  2045  Antecubital  2   Fistula / Graft Right Upper arm  11/06/19  1334  Upper arm  82   Hemodialysis Catheter Left Internal jugular Double lumen Permanent (Tunneled) 05/24/19  1450  Internal jugular  248   Incision (Closed) 09/10/19 Arm Right 09/10/19  1035  - 139   Incision (Closed) 11/06/19 Arm Right 11/06/19  1316  - 82   Pressure Injury 11/24/19 Sacrum Posterior Stage 2 -  Partial thickness loss of dermis presenting as a shallow open injury with a red, pink wound bed without slough. 11/24/19  1930  - 64   Pressure Injury 12/03/19 Heel Posterior;Left;Right open wound 12/03/19  0000  - 55   Wound / Incision (Open or Dehisced) 10/13/19 Skin tear Arm Left 10/13/19  1630  Arm  106          Intake/Output Last 24 hours No intake or output data in the 24 hours ending 01/27/20 0914  Labs/Imaging Results for orders placed or  performed during the hospital encounter of 02/02/2020 (from the past 48 hour(s))  Resp Panel by RT-PCR (Flu A&B, Covid) Nasopharyngeal Swab     Status: Abnormal   Collection Time: 01/21/2020  7:17 PM   Specimen: Nasopharyngeal Swab; Nasopharyngeal(NP) swabs in vial transport medium  Result Value Ref Range   SARS Coronavirus 2 by RT PCR POSITIVE (A) NEGATIVE    Comment: RESULT CALLED TO, READ BACK BY AND VERIFIED WITH: C TURNER AT 2041 ON 01/30/2020 BY MOSLEY,J (NOTE) SARS-CoV-2 target nucleic acids are DETECTED.  The SARS-CoV-2 RNA is generally detectable in upper respiratory specimens during the acute phase of infection. Positive results are indicative of the presence of the identified virus, but do not rule out bacterial infection or co-infection with other pathogens not detected by the test. Clinical correlation with patient history and other diagnostic information is necessary to determine patient infection status. The expected result is Negative.  Fact Sheet for Patients: EntrepreneurPulse.com.au  Fact Sheet for Healthcare Providers: IncredibleEmployment.be  This test is not yet approved or cleared by the [place] FDA and  has been authorized for detection and/or diagnosis of SARS-CoV-2 by FDA under an Emergency Use Authorization (EUA).  This EUA will remain in effect (meaning this te st can be used) for the duration of  the COVID-19 declaration under Section 564(b)(1) of the Act, 21 U.S.C. section 360bbb-3(b)(1), unless the authorization is terminated or revoked sooner.     Influenza A by PCR NEGATIVE NEGATIVE   Influenza B by PCR NEGATIVE NEGATIVE    Comment: (NOTE) The Xpert Xpress SARS-CoV-2/FLU/RSV plus assay is intended as an aid in the diagnosis of influenza from Nasopharyngeal swab specimens and should not be used as a sole basis for treatment. Nasal washings and aspirates are unacceptable for Xpert Xpress  SARS-CoV-2/FLU/RSV testing.  Fact Sheet for Patients: EntrepreneurPulse.com.au  Fact Sheet for Healthcare Providers: IncredibleEmployment.be  This test is not yet approved or cleared by the [place] FDA and has been authorized for detection and/or diagnosis of SARS-CoV-2 by FDA under an Emergency Use Authorization (EUA). This EUA will remain in effect (meaning this test can be used) for the duration of the COVID-19 declaration under Section 564(b)(1) of the Act, 21 U.S.C. section 360bbb-3(b)(1), unless the authorization is terminated or revoked.  Performed at [facility], [address] [place], [place] [address]   Brain natriuretic peptide     Status: Abnormal   Collection Time: 02/07/2020  8:30 PM  Result Value Ref Range  B Natriuretic Peptide 1,437.0 (H) 0.0 - 100.0 pg/mL    Comment: Performed at [facility], [address], [place], [place] [address]  Lactic acid, plasma     Status: Abnormal   Collection Time: 01/19/2020  8:30 PM  Result Value Ref Range   Lactic Acid, Venous 4.4 (HH) 0.5 - 1.9 mmol/L    Comment: CRITICAL RESULT CALLED TO, READ BACK BY AND VERIFIED WITH: TURNER,C ON 02/08/2020 AT 2125 BY LOY,C Performed at [facility], [address], [place], [place] [address]   Blood Culture (routine x 2)     Status: None (Preliminary result)   Collection Time: 01/17/2020  8:30 PM   Specimen: BLOOD  Result Value Ref Range   Specimen Description      BLOOD LEFT ANTECUBITAL BOTTLES DRAWN AEROBIC AND ANAEROBIC Blood Culture adequate volume Performed at [facility], [address], [place], [place] [address]    Special Requests      NONE Performed at [facility], [address], [place], [place] [address]    Culture  Setup Time      GRAM POSITIVE COCCI Gram Stain Report Called to,Read Back By and Verified With: SENSKE,P @ 2151 ON 01/26/20 BY JUW ONE SET GS DONE @ [facility] Organism ID to follow CRITICAL RESULT CALLED TO, READ BACK BY AND  VERIFIED WITH: Performed at [facility], [place] [address], [place], [place] [address]    Culture GRAM POSITIVE COCCI    Report Status PENDING   CBC WITH DIFFERENTIAL     Status: Abnormal   Collection Time: 01/19/2020  8:30 PM  Result Value Ref Range   WBC 6.3 4.0 - 10.5 K/uL   RBC 2.76 (L) 4.22 - 5.81 MIL/uL   Hemoglobin 8.8 (L) 13.0 - 17.0 g/dL   HCT 30.7 (L) 39.0 - 52.0 %   MCV 111.2 (H) 80.0 - 100.0 fL   MCH 31.9 26.0 - 34.0 pg   MCHC 28.7 (L) 30.0 - 36.0 g/dL   RDW 20.3 (H) 11.5 - 15.5 %   Platelets 95 (L) 150 - 400 K/uL    Comment: CONSISTENT WITH PREVIOUS RESULT   nRBC 1.7 (H) 0.0 - 0.2 %   Neutrophils Relative % 93 %   Neutro Abs 5.9 1.7 - 7.7 K/uL   Lymphocytes Relative 2 %   Lymphs Abs 0.1 (L) 0.7 - 4.0 K/uL   Monocytes Relative 4 %   Monocytes Absolute 0.3 0.1 - 1.0 K/uL   Eosinophils Relative 0 %   Eosinophils Absolute 0.0 0.0 - 0.5 K/uL   Basophils Relative 0 %   Basophils Absolute 0.0 0.0 - 0.1 K/uL   Immature Granulocytes 1 %   Abs Immature Granulocytes 0.08 (H) 0.00 - 0.07 K/uL    Comment: Performed at [facility], [address] [place],  [address]  Comprehensive metabolic panel     Status: Abnormal   Collection Time: 02/05/2020  8:30 PM  Result Value Ref Range   Sodium 137 135 - 145 mmol/L   Potassium 4.4 3.5 - 5.1 mmol/L    Comment: DELTA CHECK NOTED   Chloride 94 (L) 98 - 111 mmol/L   CO2 27 22 - 32 mmol/L   Glucose, Bld 128 (H) 70 - 99 mg/dL    Comment: Glucose reference range applies only to samples taken after fasting for at least 8 hours.   BUN 31 (H) 8 - 23 mg/dL   Creatinine, Ser 2.62 (H) 0.61 - 1.24 mg/dL   Calcium 7.9 (L)  8.9 - 10.3 mg/dL   Total Protein 5.7 (L) 6.5 - 8.1 g/dL   Albumin 2.6 (L) 3.5 - 5.0 g/dL   AST 29 15 - 41 U/L   ALT <5 0 - 44 U/L   Alkaline Phosphatase 53 38 - 126 U/L   Total Bilirubin 1.3 (H) 0.3 - 1.2 mg/dL   GFR, Estimated 23 (L) >60 mL/min    Comment: (NOTE) Calculated using the CKD-EPI Creatinine  Equation (2021)    Anion gap 16 (H) 5 - 15    Comment: Performed at [facility], [address], [place], [place] [address]  D-dimer, quantitative     Status: Abnormal   Collection Time: 02/15/2020  8:30 PM  Result Value Ref Range   D-Dimer, Quant 2.41 (H) 0.00 - 0.50 ug/mL-FEU    Comment: (NOTE) At the manufacturer cut-off value of 0.5 g/mL FEU, this assay has a negative predictive value of 95-100%.This assay is intended for use in conjunction with a clinical pretest probability (PTP) assessment model to exclude pulmonary embolism (PE) and deep venous thrombosis (DVT) in outpatients suspected of PE or DVT. Results should be correlated with clinical presentation. Performed at [facility], [address] [place], [place] [address]   Procalcitonin     Status: None   Collection Time: 02/11/2020  8:30 PM  Result Value Ref Range   Procalcitonin 5.50 ng/mL    Comment:        Interpretation: PCT > 2 ng/mL: Systemic infection (sepsis) is likely, unless other causes are known. (NOTE)       Sepsis PCT Algorithm           Lower Respiratory Tract                                      Infection PCT Algorithm    ----------------------------     ----------------------------         PCT < 0.25 ng/mL                PCT < 0.10 ng/mL          Strongly encourage             Strongly discourage   discontinuation of antibiotics    initiation of antibiotics    ----------------------------     -----------------------------       PCT 0.25 - 0.50 ng/mL            PCT 0.10 - 0.25 ng/mL               OR       >80% decrease in PCT            Discourage initiation of                                            antibiotics      Encourage discontinuation           of antibiotics    ----------------------------     -----------------------------         PCT >= 0.50 ng/mL              PCT 0.26 - 0.50 ng/mL               AND       <80%  decrease in PCT              Encourage initiation of                                              antibiotics       Encourage continuation           of antibiotics    ----------------------------     -----------------------------        PCT >= 0.50 ng/mL                  PCT > 0.50 ng/mL               AND         increase in PCT                  Strongly encourage                                      initiation of antibiotics    Strongly encourage escalation           of antibiotics                                     -----------------------------                                           PCT <= 0.25 ng/mL                                                 OR                                        > 80% decrease in PCT                                      Discontinue / Do not initiate                                             antibiotics  Performed at [facility], [address] [place], [place] [address]   Lactate dehydrogenase     Status: Abnormal   Collection Time: 01/27/2020  8:30 PM  Result Value Ref Range   LDH 234 (H) 98 - 192 U/L    Comment: Performed at [facility], [address], [place], [place] [address]  Ferritin     Status: Abnormal   Collection Time: 02/13/2020  8:30 PM  Result Value Ref Range   Ferritin 1,529 (H) 24 - 336 ng/mL    Comment: Performed at [facility], [address], [place], [place] [address]  Triglycerides  Status: None   Collection Time: 01/20/2020  8:30 PM  Result Value Ref Range   Triglycerides 101 <150 mg/dL    Comment: Performed at [facility], [address] [place], [place] [address]  Fibrinogen     Status: Abnormal   Collection Time: 01/27/2020  8:30 PM  Result Value Ref Range   Fibrinogen 756 (H) 210 - 475 mg/dL    Comment: Performed at [facility], [address], [place], [place] [address]  C-reactive protein     Status: Abnormal   Collection Time: 02/08/2020  8:30 PM  Result Value Ref Range   CRP 52.5 (H) <1.0 mg/dL    Comment: RESULTS CONFIRMED BY MANUAL DILUTION Performed at [facility], [address] [place], [place] [address]   Troponin I (High Sensitivity)     Status: Abnormal   Collection Time: 01/18/2020  8:30 PM  Result Value Ref Range   Troponin I (High Sensitivity) 81 (H) <18 ng/L    Comment: (NOTE) Elevated high sensitivity troponin I (hsTnI) values and significant  changes across serial measurements may suggest ACS but many other  chronic and acute conditions are known to elevate hsTnI results.  Refer to the Links section for chest pain algorithms and additional  guidance. Performed at [facility], [address], [place], [place] [address]   Blood Culture ID Panel (Reflexed)     Status: Abnormal   Collection Time: 01/24/2020  8:30 PM  Result Value Ref Range   Enterococcus faecalis NOT DETECTED NOT DETECTED   Enterococcus Faecium NOT DETECTED NOT DETECTED   Listeria monocytogenes NOT DETECTED NOT DETECTED   Staphylococcus species DETECTED (A) NOT DETECTED    Comment: CRITICAL RESULT CALLED TO, READ BACK BY AND VERIFIED WITH: RN RACHEL FINSKE AT 0520 BY MESSAN H. ON 01/27/2020    Staphylococcus aureus (BCID) NOT DETECTED NOT DETECTED   Staphylococcus epidermidis DETECTED (A) NOT DETECTED    Comment: CRITICAL RESULT CALLED TO, READ BACK BY AND VERIFIED WITH: RN RACHEL FINSKE AT 0520 BY MESSAN H. ON 01/27/2020    Staphylococcus lugdunensis NOT DETECTED NOT DETECTED   Streptococcus species NOT DETECTED NOT DETECTED   Streptococcus agalactiae NOT DETECTED NOT DETECTED   Streptococcus pneumoniae NOT DETECTED NOT DETECTED   Streptococcus pyogenes NOT DETECTED NOT DETECTED   A.calcoaceticus-baumannii NOT DETECTED NOT DETECTED   Bacteroides fragilis NOT DETECTED NOT DETECTED   Enterobacterales NOT DETECTED NOT DETECTED   Enterobacter cloacae complex NOT DETECTED NOT DETECTED   Escherichia coli NOT DETECTED NOT DETECTED   Klebsiella aerogenes NOT DETECTED NOT DETECTED   Klebsiella oxytoca NOT DETECTED NOT DETECTED   Klebsiella pneumoniae NOT DETECTED NOT DETECTED   Proteus  species NOT DETECTED NOT DETECTED   Salmonella species NOT DETECTED NOT DETECTED   Serratia marcescens NOT DETECTED NOT DETECTED   Haemophilus influenzae NOT DETECTED NOT DETECTED   Neisseria meningitidis NOT DETECTED NOT DETECTED   Pseudomonas aeruginosa NOT DETECTED NOT DETECTED   Stenotrophomonas maltophilia NOT DETECTED NOT DETECTED   Candida albicans NOT DETECTED NOT DETECTED   Candida auris NOT DETECTED NOT DETECTED   Candida glabrata NOT DETECTED NOT DETECTED   Candida krusei NOT DETECTED NOT DETECTED   Candida parapsilosis NOT DETECTED NOT DETECTED   Candida tropicalis NOT DETECTED NOT DETECTED   Cryptococcus neoformans/gattii NOT DETECTED NOT DETECTED   Methicillin resistance mecA/C NOT DETECTED NOT DETECTED    Comment: Performed at [facility], [address] [place], [place] [address]  Lactic acid, plasma  Status: None   Collection Time: 01/26/20 12:30 AM  Result Value Ref Range   Lactic Acid, Venous 1.9 0.5 - 1.9 mmol/L    Comment: Performed at [facility], [address], [place], [place] [address]  CBC with Differential/Platelet     Status: Abnormal   Collection Time: 01/26/20  6:56 AM  Result Value Ref Range   WBC 6.1 4.0 - 10.5 K/uL   RBC 2.52 (L) 4.22 - 5.81 MIL/uL   Hemoglobin 8.0 (L) 13.0 - 17.0 g/dL   HCT 27.6 (L) 39.0 - 52.0 %   MCV 109.5 (H) 80.0 - 100.0 fL   MCH 31.7 26.0 - 34.0 pg   MCHC 29.0 (L) 30.0 - 36.0 g/dL   RDW 20.1 (H) 11.5 - 15.5 %   Platelets 71 (L) 150 - 400 K/uL    Comment: REPEATED TO VERIFY DELTA CHECK NOTED PLATELET COUNT CONFIRMED BY SMEAR SPECIMEN CHECKED FOR CLOTS    nRBC 0.7 (H) 0.0 - 0.2 %   Neutrophils Relative % 95 %   Neutro Abs 5.7 1.7 - 7.7 K/uL   Lymphocytes Relative 1 %   Lymphs Abs 0.1 (L) 0.7 - 4.0 K/uL   Monocytes Relative 3 %   Monocytes Absolute 0.2 0.1 - 1.0 K/uL   Eosinophils Relative 0 %   Eosinophils Absolute 0.0 0.0 - 0.5 K/uL   Basophils Relative 0 %   Basophils Absolute 0.0 0.0 - 0.1 K/uL    Immature Granulocytes 1 %   Abs Immature Granulocytes 0.08 (H) 0.00 - 0.07 K/uL    Comment: Performed at [facility], [address], [place], [place] [address]  Comprehensive metabolic panel     Status: Abnormal   Collection Time: 01/26/20  6:56 AM  Result Value Ref Range   Sodium 137 135 - 145 mmol/L   Potassium 4.1 3.5 - 5.1 mmol/L   Chloride 94 (L) 98 - 111 mmol/L   CO2 26 22 - 32 mmol/L   Glucose, Bld 112 (H) 70 - 99 mg/dL    Comment: Glucose reference range applies only to samples taken after fasting for at least 8 hours.   BUN 40 (H) 8 - 23 mg/dL   Creatinine, Ser 2.95 (H) 0.61 - 1.24 mg/dL   Calcium 7.7 (L) 8.9 - 10.3 mg/dL   Total Protein 4.9 (L) 6.5 - 8.1 g/dL   Albumin 2.1 (L) 3.5 - 5.0 g/dL   AST 20 15 - 41 U/L   ALT 15 0 - 44 U/L   Alkaline Phosphatase 46 38 - 126 U/L   Total Bilirubin 1.1 0.3 - 1.2 mg/dL   GFR, Estimated 20 (L) >60 mL/min    Comment: (NOTE) Calculated using the CKD-EPI Creatinine Equation (2021)    Anion gap 17 (H) 5 - 15    Comment: Performed at [facility], [address] [place], [place] [address]  C-reactive protein     Status: Abnormal   Collection Time: 01/26/20  6:56 AM  Result Value Ref Range   CRP 51.5 (H) <1.0 mg/dL    Comment: RESULTS CONFIRMED BY MANUAL DILUTION Performed at [facility], [address] [place], [place] [address]   D-dimer, quantitative (not at [facility])     Status: Abnormal   Collection Time: 01/26/20  6:56 AM  Result Value Ref Range   D-Dimer, Quant 2.59 (H) 0.00 - 0.50 ug/mL-FEU    Comment: (NOTE) At the manufacturer cut-off value of 0.5 g/mL FEU, this assay has a negative predictive value of 95-100%.This assay  is intended for use in conjunction with a clinical pretest probability (PTP) assessment model to exclude pulmonary embolism (PE) and deep venous thrombosis (DVT) in outpatients suspected of PE or DVT. Results should be correlated with clinical presentation. Performed at [facility], [address]  [place], [place] [address]   Ferritin     Status: Abnormal   Collection Time: 01/26/20  6:56 AM  Result Value Ref Range   Ferritin 1,402 (H) 24 - 336 ng/mL    Comment: RESULT CONFIRMED BY AUTOMATED DILUTION Performed at [facility], [address] [place], [place] [address]   Magnesium     Status: None   Collection Time: 01/26/20  6:56 AM  Result Value Ref Range   Magnesium 1.9 1.7 - 2.4 mg/dL    Comment: Performed at [facility], [address], [place], [place] [address]  Phosphorus     Status: Abnormal   Collection Time: 01/26/20  6:56 AM  Result Value Ref Range   Phosphorus 5.4 (H) 2.5 - 4.6 mg/dL    Comment: Performed at [facility], [address], [place], [place] [address]  Procalcitonin     Status: None   Collection Time: 01/26/20  6:56 AM  Result Value Ref Range   Procalcitonin 8.64 ng/mL    Comment:        Interpretation: PCT > 2 ng/mL: Systemic infection (sepsis) is likely, unless other causes are known. (NOTE)       Sepsis PCT Algorithm           Lower Respiratory Tract                                      Infection PCT Algorithm    ----------------------------     ----------------------------         PCT < 0.25 ng/mL                PCT < 0.10 ng/mL          Strongly encourage             Strongly discourage   discontinuation of antibiotics    initiation of antibiotics    ----------------------------     -----------------------------       PCT 0.25 - 0.50 ng/mL            PCT 0.10 - 0.25 ng/mL               OR       >80% decrease in PCT            Discourage initiation of                                            antibiotics      Encourage discontinuation           of antibiotics    ----------------------------     -----------------------------         PCT >= 0.50 ng/mL              PCT 0.26 - 0.50 ng/mL               AND       <80% decrease in PCT              Encourage initiation of  antibiotics       Encourage  continuation           of antibiotics    ----------------------------     -----------------------------        PCT >= 0.50 ng/mL                  PCT > 0.50 ng/mL               AND         increase in PCT                  Strongly encourage                                      initiation of antibiotics    Strongly encourage escalation           of antibiotics                                     -----------------------------                                           PCT <= 0.25 ng/mL                                                 OR                                        > 80% decrease in PCT                                      Discontinue / Do not initiate                                             antibiotics  Performed at [facility], [address], [place], [place] [address]   Troponin I (High Sensitivity)     Status: Abnormal   Collection Time: 01/26/20  6:56 AM  Result Value Ref Range   Troponin I (High Sensitivity) 121 (HH) <18 ng/L    Comment: CRITICAL RESULT CALLED TO, READ BACK BY AND VERIFIED WITH: DOSS M @ 0821 ON [id number] BY HENDERSON L. (NOTE) Elevated high sensitivity troponin I (hsTnI) values and significant  changes across serial measurements may suggest ACS but many other  chronic and acute conditions are known to elevate hsTnI results.  Refer to the Links section for chest pain algorithms and additional  guidance. Performed at [facility], [address] [place],  [address]   Blood Culture (routine x 2)     Status: None (Preliminary result)   Collection Time: 01/26/20  7:05 AM   Specimen: BLOOD LEFT ARM  Result Value Ref Range   Specimen Description BLOOD LEFT ARM BOTTLES DRAWN AEROBIC  ONLY    Special Requests Blood Culture adequate volume    Culture      NO GROWTH 1 DAY Performed at [facility], [address], [place], [place] [address]    Report Status PENDING   CBC with Differential/Platelet     Status: Abnormal   Collection Time: 01/27/20   5:16 AM  Result Value Ref Range   WBC 7.8 4.0 - 10.5 K/uL   RBC 2.43 (L) 4.22 - 5.81 MIL/uL   Hemoglobin 7.6 (L) 13.0 - 17.0 g/dL   HCT 26.2 (L) 39.0 - 52.0 %   MCV 107.8 (H) 80.0 - 100.0 fL   MCH 31.3 26.0 - 34.0 pg   MCHC 29.0 (L) 30.0 - 36.0 g/dL   RDW 19.9 (H) 11.5 - 15.5 %   Platelets 73 (L) 150 - 400 K/uL    Comment: REPEATED TO VERIFY PLATELET COUNT CONFIRMED BY SMEAR SPECIMEN CHECKED FOR CLOTS Immature Platelet Fraction may be clinically indicated, consider ordering this additional test [id number]    nRBC 0.6 (H) 0.0 - 0.2 %   Neutrophils Relative % 77 %   Neutro Abs 7.3 1.7 - 7.7 K/uL   Band Neutrophils 17 %   Lymphocytes Relative 1 %   Lymphs Abs 0.1 (L) 0.7 - 4.0 K/uL   Monocytes Relative 2 %   Monocytes Absolute 0.2 0.1 - 1.0 K/uL   Eosinophils Relative 0 %   Eosinophils Absolute 0.0 0.0 - 0.5 K/uL   Basophils Relative 0 %   Basophils Absolute 0.0 0.0 - 0.1 K/uL   WBC Morphology MILD LEFT SHIFT (1-5% METAS, OCC MYELO, OCC BANDS)     Comment: TOXIC GRANULATION   Metamyelocytes Relative 3 %    Comment: Performed at [facility], [address], [place], [place] [address]  Comprehensive metabolic panel     Status: Abnormal   Collection Time: 01/27/20  5:16 AM  Result Value Ref Range   Sodium 136 135 - 145 mmol/L   Potassium 4.4 3.5 - 5.1 mmol/L   Chloride 94 (L) 98 - 111 mmol/L   CO2 27 22 - 32 mmol/L   Glucose, Bld 93 70 - 99 mg/dL    Comment: Glucose reference range applies only to samples taken after fasting for at least 8 hours.   BUN 60 (H) 8 - 23 mg/dL   Creatinine, Ser 3.68 (H) 0.61 - 1.24 mg/dL   Calcium 7.7 (L) 8.9 - 10.3 mg/dL   Total Protein 4.7 (L) 6.5 - 8.1 g/dL   Albumin 1.9 (L) 3.5 - 5.0 g/dL   AST 25 15 - 41 U/L   ALT 16 0 - 44 U/L   Alkaline Phosphatase 50 38 - 126 U/L   Total Bilirubin 0.9 0.3 - 1.2 mg/dL   GFR, Estimated 15 (L) >60 mL/min    Comment: (NOTE) Calculated using the CKD-EPI Creatinine Equation (2021)    Anion gap 15 5 - 15     Comment: Performed at [facility], [address] [place], [place] [address]  D-dimer, quantitative (not at [facility])     Status: Abnormal   Collection Time: 01/27/20  5:16 AM  Result Value Ref Range   D-Dimer, Quant 3.06 (H) 0.00 - 0.50 ug/mL-FEU    Comment: (NOTE) At the manufacturer cut-off value of 0.5 g/mL FEU, this assay has a negative predictive value of 95-100%.This assay is intended for use in conjunction with a clinical pretest probability (PTP) assessment model to exclude pulmonary embolism (PE) and deep venous thrombosis (DVT)  in outpatients suspected of PE or DVT. Results should be correlated with clinical presentation. Performed at [facility], [address] [place], [place] [address]   Magnesium     Status: None   Collection Time: 01/27/20  5:16 AM  Result Value Ref Range   Magnesium 1.9 1.7 - 2.4 mg/dL    Comment: Performed at [facility], [address], [place], [place] [address]  Phosphorus     Status: Abnormal   Collection Time: 01/27/20  5:16 AM  Result Value Ref Range   Phosphorus 6.5 (H) 2.5 - 4.6 mg/dL    Comment: Performed at [facility], [address], [place], [place] [address]  Ferritin     Status: Abnormal   Collection Time: 01/27/20  5:17 AM  Result Value Ref Range   Ferritin 1,750 (H) 24 - 336 ng/mL    Comment: Performed at [facility], [address] [place], [place] [address]   DG Chest Port 1 View  Result Date: 02/05/2020 CLINICAL DATA:  Dyspnea EXAM: PORTABLE CHEST 1 VIEW COMPARISON:  01/23/2020, 12/11/2019, 08/22/2019 FINDINGS: Left-sided central venous catheter with tip over the right atrium. Post sternotomy changes. Similar small left pleural effusion with basilar airspace disease. Stable cardiomediastinal silhouette with aortic atherosclerosis. No pneumothorax. IMPRESSION: Small left pleural effusion with basilar airspace disease, atelectasis versus pneumonia. No significant change compared with radiograph 2 days ago. Electronically Signed    By: Donavan Foil M.D.   On: 01/31/2020 19:47    Pending Labs Unresulted Labs (From admission, onward)          Start     Ordered   01/26/20 1024  MRSA PCR Screening  Once,   STAT        01/26/20 1024   01/26/20 0500  CBC with Differential/Platelet  Daily,   R      02/10/2020 2355   01/26/20 0500  Comprehensive metabolic panel  Daily,   R      01/20/2020 2355   01/26/20 0500  C-reactive protein  Daily,   R      01/16/2020 2355   01/26/20 0500  D-dimer, quantitative (not at [facility])  Daily,   R      02/08/2020 2355   01/26/20 0500  Ferritin  Daily,   R      02/07/2020 2355   01/26/20 0500  Magnesium  Daily,   R      02/06/2020 2355   01/26/20 0500  Phosphorus  Daily,   R      01/27/2020 2355          Vitals/Pain Today's Vitals   01/27/20 0600 01/27/20 0700 01/27/20 0730 01/27/20 0830  BP: (!) 115/51 (!) 117/50 (!) 116/48 (!) 115/45  Pulse: 82 82 81 79  Resp: 15 14 17 13   Temp:      TempSrc:      SpO2: 98% 99% 99% 98%  Weight:      Height:      PainSc:        Isolation Precautions Airborne and Contact precautions  Medications Medications  ceFEPIme (MAXIPIME) 1 g in sodium chloride 0.9 % 100 mL IVPB (1 g Intravenous New Bag/Given 01/26/20 2117)  vancomycin (VANCOCIN) IVPB 750 mg/150 ml premix (has no administration in time range)  amiodarone (PACERONE) tablet 200 mg (200 mg Oral Given 01/26/20 0952)  pantoprazole (PROTONIX) EC tablet 40 mg (40 mg Oral Given 01/26/20 0952)  polyethylene glycol (MIRALAX / GLYCOLAX) packet 17 g (17 g Oral Given 01/26/20 2030)  senna-docusate (Senokot-S) tablet 2 tablet (2 tablets Oral Given 01/26/20 2029)  Ensure Plus liquid 237 mL (237 mLs Oral Refused 01/26/20 1548)  guaiFENesin-dextromethorphan (ROBITUSSIN DM) 100-10 MG/5ML syrup 10 mL (has no administration in time range)  chlorpheniramine-HYDROcodone (TUSSIONEX) 10-8 MG/5ML suspension 5 mL (5 mLs Oral Given 01/26/20 2132)  acetaminophen (TYLENOL) tablet 650 mg (650 mg Oral Given 01/26/20  2132)  oxyCODONE (Oxy IR/ROXICODONE) immediate release tablet 5 mg (5 mg Oral Given 01/26/20 2132)  polyethylene glycol (MIRALAX / GLYCOLAX) packet 17 g (has no administration in time range)  ondansetron (ZOFRAN) tablet 4 mg (has no administration in time range)    Or  ondansetron (ZOFRAN) injection 4 mg (has no administration in time range)  remdesivir 100 mg in sodium chloride 0.9 % 100 mL IVPB ( Intravenous Canceled Entry 01/26/20 1154)    Followed by  remdesivir 100 mg in sodium chloride 0.9 % 100 mL IVPB (has no administration in time range)  dexamethasone (DECADRON) injection 6 mg (has no administration in time range)  aspirin chewable tablet 81 mg (81 mg Oral Given 01/26/20 0952)  midodrine (PROAMATINE) tablet 10 mg (10 mg Oral Given 01/26/20 1832)  ascorbic acid (VITAMIN C) tablet 500 mg (500 mg Oral Given 01/26/20 1559)  zinc sulfate capsule 220 mg (220 mg Oral Given 01/26/20 1559)  albuterol (VENTOLIN HFA) 108 (90 Base) MCG/ACT inhaler 4 puff (4 puffs Inhalation Given 02/14/2020 1934)  vancomycin (VANCOREADY) IVPB 1500 mg/300 mL (0 mg Intravenous Stopped 01/26/20 0100)    Mobility  High fall risk   Focused Assessments    R Recommendations: See Admitting Provider Note  Report given to:   Additional Notes:

## 2020-01-27 NOTE — Progress Notes (Signed)
PROGRESS NOTE    Jesse Murphy  SWN:[id number] DOB: 1928-05-20 DOA: 02/05/2020 PCP: Leeanne Rio, MD   Chief Complaint  Patient presents with  . Shortness of Breath    Brief Narrative:  As per H&P written by Dr. Clearence Ped  On 01/26/20   Jesse Murphy  is a 86 y.o. male, with history of heparin-induced thrombocytopenia, Pseudomonas pneumonia, paroxysmal atrial fibrillation on amiodarone, myocardial infarction, ischemic cardiomyopathy, hypertension, end-stage renal disease on dialysis Monday Wednesday Friday, and more presents to the ER with a chief complaint of dyspnea.  Patient reports that shortness of breath started at 6:10 PM on 02/01/2020.  Reports that he has had a productive cough associated that it had yellow sputum.  He denies any fever, body aches, congestion.  Patient still does make urine and reports he has had no dysuria, but he is chronically incontinent.  He reports his dyspnea was better with oxygen that they put on him at dialysis.  He reports is better now with oxygen on here.  Reports that this happened night before last at his last dialysis appointment as well.  He has no chest pain, no palpitations.  He is vaccinated for Covid.  Patient is full code.  Patient has no other complaints at this time.  In the ED Temp 97.7, heart rate 99 208, respiratory rate 21-28, blood pressure 115/59 ED provider explains further that patient had dyspnea on day before yesterday at dialysis EMS transported to an ER and they reported that he had A. fib with RVR in route, but it was resolved by the time they reached the ER.  Patient was no longer dyspneic and did not have hypoxia at that time so he was discharged home. This time the difference was he was still in A. fib with RVR upon arrival to the ER with a rate of 131 on EKG, and he was hypoxic requiring 7 L with EMS titrated down to 2 L in the ER. No leukocytosis with a white blood cell count of 6.3, hemoglobin 8.8 Chemistry panel reveals  a BUN of 31 and a creatinine of 2.62 in the setting of known ESRD BNP was 1437, LDH 234, D-dimer 2.41, fibrinogen 756 Covid positive Blood cultures pending Chest x-ray showed small left pleural effusion with basilar airspace disease concerning for pneumonia EKG shows a flutter with rate of 131 QTC 470 Patient was started on cefepime and bank Admission requested for further work-up of acute hypoxic respiratory failure and pneumonia   Assessment & Plan: Acute respiratory failure with hypoxia in the setting of COVID-19 infection and pneumonia -Continue to follow inflammatory markers -Patient with separate WBCs and procalcitonin -Chest x-ray demonstrating bilateral infiltrate -Continue the use of remdesivir, steroids and broad-spectrum antibiotics and MRSA PCR -Follow culture results -Continue supportive care and titrate down oxygen supplementation to room air as tolerated; currently 6-7 L nasal cannula supplementation required.  End-stage renal disease -Patient chronically on dialysis Monday-Wednesday-Friday -Nephrology service has been consulted for dialysis continuation while inpatient -We'll follow recommendations.  Chronic anemia in the setting of renal failure -Iron and Epogen as per nephrology recommendation No signs of overt bleeding appreciated -Patient was dehydrated on presentation with higher levels of hemoglobin prior to IV antibiotics, gentle fluid resuscitation and remdesivir/steroids infusion.  Atrial fibrillation -Continue amiodarone -Clinically not on anticoagulation -Sinus rhythm currently.  Thrombocytopenia -In the setting most likely of ESRD -Continue SCDs for DVT prophylaxis -No heparin products.  Elevated troponin -In the setting of end-stage renal disease and acute lung  infection -Patient denies chest pain -Continue telemetry monitoring.   DVT prophylaxis:  Code Status: Full code Family Communication: No family at bedside; unable to reach family  over the phone. Disposition:   Status is: Inpatient  Dispo: The patient is from: Home              Anticipated d/c is to: To be determined              Anticipated d/c date is: To be determined              Patient currently no medically stable for discharge; still requiring high flow nasal cannula supplementation, feeling short of breath, having difficulty speaking in full sentences and severely weak/deconditioned. Continue IV remdesivir, IV antibiotics, follow culture results and continue the use of his steroids.       Consultants:   Nephrology service  Palliative care   Procedures:  See below for x-ray report.   Antimicrobials:  Vancomycin and cefepime.   Subjective: Chronically ill in appearance, afebrile, no chest pain, no nausea or vomiting. Still short of breath with minimal exertion. Requiring 6-7 L cannula supplementation.  Objective: Vitals:   01/27/20 1300 01/27/20 1400 01/27/20 1500 01/27/20 1600  BP: (!) 132/49 (!) 130/44 (!) 127/41 (!) 132/49  Pulse: 87 86 85 89  Resp: 12 18 19 15   Temp:      TempSrc:      SpO2: 97% 97% 100% 100%  Weight:      Height:       No intake or output data in the 24 hours ending 01/27/20 1629 Filed Weights   01/27/2020 1851  Weight: 66.7 kg    Examination:  General exam: Afebrile currently; reports feeling slightly better. Still requiring 6-7 L nasal cannula supplementation and feeling short of breath with minimal exertion. Patient denies chest pain, no nausea, no vomiting. Respiratory system: Positive rhonchi bilaterally; decreased breath sounds at the bases. No wheezing. No using accessory muscle. Using 6-7 L high flow nasal cannula supplementation. Cardiovascular system: Rate controlled, no rubs, no gallops, no JVD on exam. Gastrointestinal system: Abdomen is nondistended, soft and nontender. No organomegaly or masses felt. Normal bowel sounds heard. Central nervous system: No focal neurological deficits. Extremities:  1-2+ edema appreciated bilaterally; no cyanosis or clubbing. Skin: No petechiae. Psychiatry: Mood & affect appropriate.     Data Reviewed: I have personally reviewed following labs and imaging studies  CBC: Recent Labs  Lab 01/23/20 2227 02/02/2020 2030 01/26/20 0656 01/27/20 0516  WBC 5.7 6.3 6.1 7.8  NEUTROABS 5.1 5.9 5.7 7.3  HGB 9.8* 8.8* 8.0* 7.6*  HCT 33.7* 30.7* 27.6* 26.2*  MCV 109.8* 111.2* 109.5* 107.8*  PLT 90* 95* 71* 73*    Basic Metabolic Panel: Recent Labs  Lab 01/23/20 2227 01/24/2020 2030 01/26/20 0656 01/27/20 0516  NA 135 137 137 136  K 3.1* 4.4 4.1 4.4  CL 92* 94* 94* 94*  CO2 31 27 26 27   GLUCOSE 117* 128* 112* 93  BUN 22 31* 40* 60*  CREATININE 2.13* 2.62* 2.95* 3.68*  CALCIUM 7.9* 7.9* 7.7* 7.7*  MG  --   --  1.9 1.9  PHOS  --   --  5.4* 6.5*    GFR: Estimated Creatinine Clearance: 13 mL/min (A) (by C-G formula based on SCr of 3.68 mg/dL (H)).  Liver Function Tests: Recent Labs  Lab 01/30/2020 2030 01/26/20 0656 01/27/20 0516  AST 29 20 25   ALT 5 15 16   ALKPHOS 53 46 50  BILITOT 1.3* 1.1 0.9  PROT 5.7* 4.9* 4.7*  ALBUMIN 2.6* 2.1* 1.9*    CBG: No results for input(s): GLUCAP in the last 168 hours.   Recent Results (from the past 240 hour(s))  Resp Panel by RT-PCR (Flu A&B, Covid) Nasopharyngeal Swab     Status: Abnormal   Collection Time: 01/18/2020  7:17 PM   Specimen: Nasopharyngeal Swab; Nasopharyngeal(NP) swabs in vial transport medium  Result Value Ref Range Status   SARS Coronavirus 2 by RT PCR POSITIVE (A) NEGATIVE Final    Comment: RESULT CALLED TO, READ BACK BY AND VERIFIED WITH: C TURNER AT 2041 ON 02/15/2020 BY MOSLEY,J (NOTE) SARS-CoV-2 target nucleic acids are DETECTED.  The SARS-CoV-2 RNA is generally detectable in upper respiratory specimens during the acute phase of infection. Positive results are indicative of the presence of the identified virus, but do not rule out bacterial infection or co-infection with  other pathogens not detected by the test. Clinical correlation with patient history and other diagnostic information is necessary to determine patient infection status. The expected result is Negative.  Fact Sheet for Patients: EntrepreneurPulse.com.au  Fact Sheet for Healthcare Providers: IncredibleEmployment.be  This test is not yet approved or cleared by the [place] FDA and  has been authorized for detection and/or diagnosis of SARS-CoV-2 by FDA under an Emergency Use Authorization (EUA).  This EUA will remain in effect (meaning this te st can be used) for the duration of  the COVID-19 declaration under Section 564(b)(1) of the Act, 21 U.S.C. section 360bbb-3(b)(1), unless the authorization is terminated or revoked sooner.     Influenza A by PCR NEGATIVE NEGATIVE Final   Influenza B by PCR NEGATIVE NEGATIVE Final    Comment: (NOTE) The Xpert Xpress SARS-CoV-2/FLU/RSV plus assay is intended as an aid in the diagnosis of influenza from Nasopharyngeal swab specimens and should not be used as a sole basis for treatment. Nasal washings and aspirates are unacceptable for Xpert Xpress SARS-CoV-2/FLU/RSV testing.  Fact Sheet for Patients: EntrepreneurPulse.com.au  Fact Sheet for Healthcare Providers: IncredibleEmployment.be  This test is not yet approved or cleared by the [place] FDA and has been authorized for detection and/or diagnosis of SARS-CoV-2 by FDA under an Emergency Use Authorization (EUA). This EUA will remain in effect (meaning this test can be used) for the duration of the COVID-19 declaration under Section 564(b)(1) of the Act, 21 U.S.C. section 360bbb-3(b)(1), unless the authorization is terminated or revoked.  Performed at [facility], [address] [place], [place] [address]   Blood Culture (routine x 2)     Status: None (Preliminary result)   Collection Time: 01/22/2020  8:30  PM   Specimen: BLOOD  Result Value Ref Range Status   Specimen Description   Final    BLOOD LEFT ANTECUBITAL BOTTLES DRAWN AEROBIC AND ANAEROBIC Blood Culture adequate volume Performed at [facility], [address] [place], [place] [address]    Special Requests   Final    NONE Performed at [facility], [address], [place], [place] [address]    Culture  Setup Time   Final    GRAM POSITIVE COCCI Gram Stain Report Called to,Read Back By and Verified With: SENSKE,P @ 2151 ON 01/26/20 BY JUW ONE SET GS DONE @ [facility] Organism ID to follow CRITICAL RESULT CALLED TO, READ BACK BY AND VERIFIED WITH: Performed at [facility], [place] [address], [place], [place] [address]    Culture [facility] POSITIVE COCCI  Final   Report Status  PENDING  Incomplete  Blood Culture ID Panel (Reflexed)     Status: Abnormal   Collection Time: 01/27/2020  8:30 PM  Result Value Ref Range Status   Enterococcus faecalis NOT DETECTED NOT DETECTED Final   Enterococcus Faecium NOT DETECTED NOT DETECTED Final   Listeria monocytogenes NOT DETECTED NOT DETECTED Final   Staphylococcus species DETECTED (A) NOT DETECTED Final    Comment: CRITICAL RESULT CALLED TO, READ BACK BY AND VERIFIED WITH: RN RACHEL FINSKE AT 0520 BY MESSAN H. ON 01/27/2020    Staphylococcus aureus (BCID) NOT DETECTED NOT DETECTED Final   Staphylococcus epidermidis DETECTED (A) NOT DETECTED Final    Comment: CRITICAL RESULT CALLED TO, READ BACK BY AND VERIFIED WITH: RN RACHEL FINSKE AT 0520 BY MESSAN H. ON 01/27/2020    Staphylococcus lugdunensis NOT DETECTED NOT DETECTED Final   Streptococcus species NOT DETECTED NOT DETECTED Final   Streptococcus agalactiae NOT DETECTED NOT DETECTED Final   Streptococcus pneumoniae NOT DETECTED NOT DETECTED Final   Streptococcus pyogenes NOT DETECTED NOT DETECTED Final   A.calcoaceticus-baumannii NOT DETECTED NOT DETECTED Final   Bacteroides fragilis NOT DETECTED NOT DETECTED Final   Enterobacterales NOT  DETECTED NOT DETECTED Final   Enterobacter cloacae complex NOT DETECTED NOT DETECTED Final   Escherichia coli NOT DETECTED NOT DETECTED Final   Klebsiella aerogenes NOT DETECTED NOT DETECTED Final   Klebsiella oxytoca NOT DETECTED NOT DETECTED Final   Klebsiella pneumoniae NOT DETECTED NOT DETECTED Final   Proteus species NOT DETECTED NOT DETECTED Final   Salmonella species NOT DETECTED NOT DETECTED Final   Serratia marcescens NOT DETECTED NOT DETECTED Final   Haemophilus influenzae NOT DETECTED NOT DETECTED Final   Neisseria meningitidis NOT DETECTED NOT DETECTED Final   Pseudomonas aeruginosa NOT DETECTED NOT DETECTED Final   Stenotrophomonas maltophilia NOT DETECTED NOT DETECTED Final   Candida albicans NOT DETECTED NOT DETECTED Final   Candida auris NOT DETECTED NOT DETECTED Final   Candida glabrata NOT DETECTED NOT DETECTED Final   Candida krusei NOT DETECTED NOT DETECTED Final   Candida parapsilosis NOT DETECTED NOT DETECTED Final   Candida tropicalis NOT DETECTED NOT DETECTED Final   Cryptococcus neoformans/gattii NOT DETECTED NOT DETECTED Final   Methicillin resistance mecA/C NOT DETECTED NOT DETECTED Final    Comment: Performed at [facility], [address], [place], [place] [address]  Blood Culture (routine x 2)     Status: None (Preliminary result)   Collection Time: 01/26/20  7:05 AM   Specimen: BLOOD LEFT ARM  Result Value Ref Range Status   Specimen Description BLOOD LEFT ARM BOTTLES DRAWN AEROBIC ONLY  Final   Special Requests Blood Culture adequate volume  Final   Culture   Final    NO GROWTH 1 DAY Performed at [facility], [address] [place], [place] [address]    Report Status PENDING  Incomplete     Radiology Studies: DG Chest Port 1 View  Result Date: 02/15/2020 CLINICAL DATA:  Dyspnea EXAM: PORTABLE CHEST 1 VIEW COMPARISON:  01/23/2020, 12/11/2019, 08/22/2019 FINDINGS: Left-sided central venous catheter with tip over the right atrium. Post  sternotomy changes. Similar small left pleural effusion with basilar airspace disease. Stable cardiomediastinal silhouette with aortic atherosclerosis. No pneumothorax. IMPRESSION: Small left pleural effusion with basilar airspace disease, atelectasis versus pneumonia. No significant change compared with radiograph 2 days ago. Electronically Signed   By: Donavan Foil M.D.   On: 01/24/2020 19:47    Scheduled Meds: . amiodarone  200 mg Oral  Daily  . vitamin C  500 mg Oral Daily  . aspirin  81 mg Oral Daily  . Chlorhexidine Gluconate Cloth  6 each Topical Daily  . dexamethasone (DECADRON) injection  6 mg Intravenous Q24H  . Ensure Plus  237 mL Oral BID BM  . midodrine  10 mg Oral BID WC  . pantoprazole  40 mg Oral Daily  . polyethylene glycol  17 g Oral BID  . senna-docusate  2 tablet Oral BID  . zinc sulfate  220 mg Oral Daily   Continuous Infusions: . ceFEPime (MAXIPIME) IV 1 g (01/26/20 2117)  . remdesivir 100 mg in NS 100 mL 100 mg (01/27/20 1450)  . [START ON 02-15-2020] vancomycin       LOS: 2 days    Time spent: 35 minutes   Barton Dubois, MD [facility]   To contact the attending provider between 7A-7P or the covering provider during after hours 7P-7A, please log into the web site [email or web address] and access using universal [place] password for that web site. If you do not have the password, please call the hospital operator.  01/27/2020, 4:29 PM

## 2020-01-28 ENCOUNTER — Inpatient Hospital Stay (HOSPITAL_COMMUNITY): Payer: Medicare Other

## 2020-01-28 DIAGNOSIS — Z7189 Other specified counseling: Secondary | ICD-10-CM

## 2020-01-28 DIAGNOSIS — I4891 Unspecified atrial fibrillation: Secondary | ICD-10-CM

## 2020-01-28 DIAGNOSIS — Z515 Encounter for palliative care: Secondary | ICD-10-CM

## 2020-01-28 LAB — CBC WITH DIFFERENTIAL/PLATELET
Abs Immature Granulocytes: 0.13 10*3/uL — ABNORMAL HIGH (ref 0.00–0.07)
Basophils Absolute: 0 10*3/uL (ref 0.0–0.1)
Basophils Relative: 0 %
Eosinophils Absolute: 0 10*3/uL (ref 0.0–0.5)
Eosinophils Relative: 0 %
HCT: 28.7 % — ABNORMAL LOW (ref 39.0–52.0)
Hemoglobin: 7.9 g/dL — ABNORMAL LOW (ref 13.0–17.0)
Immature Granulocytes: 2 %
Lymphocytes Relative: 1 %
Lymphs Abs: 0.1 10*3/uL — ABNORMAL LOW (ref 0.7–4.0)
MCH: 31.1 pg (ref 26.0–34.0)
MCHC: 27.5 g/dL — ABNORMAL LOW (ref 30.0–36.0)
MCV: 113 fL — ABNORMAL HIGH (ref 80.0–100.0)
Monocytes Absolute: 0.3 10*3/uL (ref 0.1–1.0)
Monocytes Relative: 4 %
Neutro Abs: 8.2 10*3/uL — ABNORMAL HIGH (ref 1.7–7.7)
Neutrophils Relative %: 93 %
Platelets: 80 10*3/uL — ABNORMAL LOW (ref 150–400)
RBC: 2.54 MIL/uL — ABNORMAL LOW (ref 4.22–5.81)
RDW: 20.2 % — ABNORMAL HIGH (ref 11.5–15.5)
WBC: 8.7 10*3/uL (ref 4.0–10.5)
nRBC: 1.4 % — ABNORMAL HIGH (ref 0.0–0.2)

## 2020-01-28 LAB — PHOSPHORUS: Phosphorus: 9 mg/dL — ABNORMAL HIGH (ref 2.5–4.6)

## 2020-01-28 LAB — COMPREHENSIVE METABOLIC PANEL
ALT: 18 U/L (ref 0–44)
AST: 26 U/L (ref 15–41)
Albumin: 2 g/dL — ABNORMAL LOW (ref 3.5–5.0)
Alkaline Phosphatase: 59 U/L (ref 38–126)
Anion gap: >20 — ABNORMAL HIGH (ref 5–15)
BUN: 77 mg/dL — ABNORMAL HIGH (ref 8–23)
CO2: 17 mmol/L — ABNORMAL LOW (ref 22–32)
Calcium: 7.5 mg/dL — ABNORMAL LOW (ref 8.9–10.3)
Chloride: 97 mmol/L — ABNORMAL LOW (ref 98–111)
Creatinine, Ser: 4.27 mg/dL — ABNORMAL HIGH (ref 0.61–1.24)
GFR, Estimated: 13 mL/min — ABNORMAL LOW (ref >60)
Glucose, Bld: 111 mg/dL — ABNORMAL HIGH (ref 70–99)
Potassium: 5.6 mmol/L — ABNORMAL HIGH (ref 3.5–5.1)
Sodium: 139 mmol/L (ref 135–145)
Total Bilirubin: 1 mg/dL (ref 0.3–1.2)
Total Protein: 4.9 g/dL — ABNORMAL LOW (ref 6.5–8.1)

## 2020-01-28 LAB — TROPONIN I (HIGH SENSITIVITY): Troponin I (High Sensitivity): 90 ng/L — ABNORMAL HIGH (ref <18)

## 2020-01-28 LAB — MAGNESIUM: Magnesium: 2.2 mg/dL (ref 1.7–2.4)

## 2020-01-28 LAB — LACTIC ACID, PLASMA: Lactic Acid, Venous: 1.1 mmol/L (ref 0.5–1.9)

## 2020-01-28 LAB — GLUCOSE, CAPILLARY: Glucose-Capillary: 77 mg/dL (ref 70–99)

## 2020-01-28 MED ORDER — IOHEXOL 350 MG/ML SOLN
100.0000 mL | Freq: Once | INTRAVENOUS | Status: AC | PRN
  Administered 2020-01-28: 100 mL via INTRAVENOUS

## 2020-01-28 MED ORDER — OXYCODONE HCL 20 MG/ML PO CONC: 5.0000 mg | ORAL | Status: DC | PRN

## 2020-01-28 MED ORDER — HYDROMORPHONE HCL 1 MG/ML IJ SOLN
0.5000 mg | INTRAMUSCULAR | Status: DC | PRN
  Filled 2020-01-28: qty 0.5

## 2020-01-28 MED ORDER — OXYCODONE HCL 20 MG/ML PO CONC
5.0000 mg | ORAL | Status: DC | PRN
  Filled 2020-01-28: qty 1

## 2020-01-28 MED FILL — Medication: Qty: 1 | Status: AC

## 2020-01-29 LAB — CULTURE, BLOOD (ROUTINE X 2)

## 2020-01-31 LAB — CULTURE, BLOOD (ROUTINE X 2)
Culture: NO GROWTH
Special Requests: ADEQUATE

## 2020-02-16 NOTE — Progress Notes (Signed)
Consultation Note Date: 2020/02/13   Patient Name: Jesse Murphy  DOB: 1928-03-19  MRN: [id number]  Age / Sex: 87 y.o., male  PCP: Leeanne Rio, MD Referring Physician: Barton Dubois, MD  Reason for Consultation: Establishing goals of care  HPI/Patient Profile: 87 y.o. male  with past medical history of HIT, ESRD on dialysis, pneumonia, bullous pemphigus, ischemic cardiomyopathy, HTN  admitted on 02/11/2020 with worsening dypsnea. Workup reveals Covid-19 pneumonia, bacterial pneumonia. Admission complicated by a fib with RVR resulting in hypotension. He was cardioverted last evening. He is requiring amiodorone infusion, was on neosynephrine briefly but that has been stopped.  Palliative medicine consulted for goals of care.   Clinical Assessment and Goals of Care: Evaluated patient at bedside. He is awake but not oriented. He is calling out "help, help". He is unable to be comforted.  I called patient's son. We discussed patients current medical state and concerns that patient is at end of life. His son expressed the desire to bring his dad home- that he would want to die at home, not in a hospital.  Patient has caregivers at home and Jesse Murphy is going to discuss if they would be comfortable taking care of patient with Covid +. We also discussed that Hospice could be of service.  I discussed what transition to full comfort measures only vs continued aggressive care would look like- stopping dialysis, cardiac medications, antibiotics- supporting patient by treating symptoms and focusing on his comfort and dignity.   Primary Decision Maker NEXT OF KIN- patient's children    SUMMARY OF RECOMMENDATIONS -Patient appears to be dying and uncomfortable -Oxycodone intensol 5mg  prn q2hr sublingual for pain, discomfort (was on pill but cannot swallow)- avoid morphine due to ESRD -hydromorphone .5mg  IV for pain and  discomfort  -Patient's son to discuss with family members and return call with decision   Code Status/Advance Care Planning:  DNR  Palliative Prophylaxis:   Frequent Pain Assessment  Additional Recommendations (Limitations, Scope, Preferences): Full Comfort Care recommended- patient's son to call back after he discusses with family  Prognosis:    Hours - Days  Discharge Planning: To Be Determined  Primary Diagnoses: Present on Admission: . Respiratory failure with hypoxia ([place]) . Atrial fibrillation with RVR ([place])   I have reviewed the medical record, interviewed the patient and family, and examined the patient. The following aspects are pertinent.  Past Medical History:  Diagnosis Date  . Anemia   . CAD (coronary artery disease)    a. s/p PCI in 1992 b. Coronary CT in 01/2018 showing extensive coronary calcification; FFR indeterminate --> medical management pursued at that time as patient not interested in repeat cath. c. s/p CABGx3 in 02/2019.  . Cancer [facility])    prostate  . Cardiogenic shock ([place])   . CHB (complete heart block) (HCC)    a. due to vagal event in 2021, requiring temp pacer.  . Chronic combined systolic and diastolic CHF (congestive heart failure) ([place])   . Debilitated   . Decubitus ulcer of  coccyx, unspecified pressure ulcer stage   . Dyspnea   . End stage renal disease ([place])   . Esophagitis   . H/O tracheostomy    a. during admit in 2021 for respiratory failure.  . Hematuria   . Hypertension   . Hypoalbuminemia   . Ischemic cardiomyopathy   . Myocardial infarction ([place])   . PAF (paroxysmal atrial fibrillation) ([place])   . 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 Pleural effusion, left    a. s/p pleurX catheter  . Pressure ulcer of both heels   . Pseudomonas pneumonia ([place])   . Thrombocytopenia ([place])   . Walker as ambulation aid    also uses wheelchair   Social History   Socioeconomic History  . Marital status: Married    Spouse name: Not on file   . Number of children: 2  . Years of education: Not on file  . Highest education level: Not on file  Occupational History  . Occupation: Librarian, academic in supply    Comment: [employer]  Tobacco Use  . Smoking status: Former Smoker    Packs/day: 0.25    Years: 50.00    Pack years: 12.50    Types: Cigarettes    Quit date: 02/15/1990    Years since quitting: 29.9  . Smokeless tobacco: Never Used  Vaping Use  . Vaping Use: Never used  Substance and Sexual Activity  . Alcohol use: No  . Drug use: No  . Sexual activity: Not Currently  Other Topics Concern  . Not on file  Social History Narrative  . Not on file   Social Determinants of Health   Financial Resource Strain: Not on file  Food Insecurity: Not on file  Transportation Needs: Not on file  Physical Activity: Not on file  Stress: Not on file  Social Connections: Not on file   Scheduled Meds: . vitamin C  500 mg Oral Daily  . aspirin  81 mg Oral Daily  . Chlorhexidine Gluconate Cloth  6 each Topical Daily  . dexamethasone (DECADRON) injection  6 mg Intravenous Q24H  . Ensure Plus  237 mL Oral BID BM  . midodrine  10 mg Oral BID WC  . pantoprazole  40 mg Oral Daily  . polyethylene glycol  17 g Oral BID  . senna-docusate  2 tablet Oral BID  . zinc sulfate  220 mg Oral Daily   Continuous Infusions: . amiodarone 30 mg/hr (2020-02-22 0530)  . ceFEPime (MAXIPIME) IV 200 mL/hr at 2020-02-22 4665  . phenylephrine (NEO-SYNEPHRINE) Adult infusion Stopped (01/27/20 2015)  . remdesivir 100 mg in NS 100 mL 200 mL/hr at 01/27/20 2326  . vancomycin     PRN Meds:.acetaminophen, chlorpheniramine-HYDROcodone, guaiFENesin-dextromethorphan, HYDROmorphone (DILAUDID) injection, ondansetron **OR** ondansetron (ZOFRAN) IV, oxyCODONE, oxyCODONE, polyethylene glycol Medications Prior to Admission:  Prior to Admission medications   Medication Sig Start Date End Date Taking? Authorizing Provider  acetaminophen (TYLENOL) 500 MG tablet Take 500 mg  by mouth every 6 (six) hours as needed for moderate pain.   Yes [provider]  acetaminophen (TYLENOL) 650 MG CR tablet Take 650 mg by mouth every 8 (eight) hours as needed for pain.   Yes [provider]  amiodarone (PACERONE) 200 MG tablet Take 1 tablet (200 mg total) by mouth See admin instructions. oral amiodarone 200mg  bid x 4 weeks, then 200mg  daily after that Patient taking differently: Take 200 mg by mouth daily. 12/12/19  Yes Roxan Hockey, MD  aspirin 81 MG chewable tablet Chew 81 mg by  mouth in the morning.  05/21/19  Yes [provider]  b complex-vitamin c-folic acid (NEPHRO-VITE) 0.8 MG TABS tablet Take 1 tablet by mouth at bedtime.   Yes [provider]  carboxymethylcellulose (REFRESH PLUS) 0.5 % SOLN Place 1 drop into both eyes 4 (four) times daily. 10/08/19  Yes [provider]  clotrimazole (MYCELEX) 10 MG troche Take 10 mg by mouth 2 (two) times daily as needed (yeast).   Yes [provider]  Ensure Plus (ENSURE PLUS) LIQD Take 237 mLs by mouth 2 (two) times daily between meals.   Yes [provider]  midodrine (PROAMATINE) 10 MG tablet Take 10 mg by mouth 2 (two) times daily.  10/08/19 10/07/20 Yes [provider]  nystatin ointment (MYCOSTATIN) Apply 1 application topically daily as needed (Apply to Penis as needed if itching occurs.).  06/22/19  Yes [provider]  ondansetron (ZOFRAN) 4 MG tablet Take 4 mg by mouth every 8 (eight) hours as needed for nausea or vomiting.   Yes [provider]  pantoprazole (PROTONIX) 40 MG tablet Take 1 tablet (40 mg total) by mouth daily. 12/12/19 12/11/20 Yes Emokpae, Courage, MD  predniSONE (DELTASONE) 10 MG tablet Take 40 mg by mouth daily.  02/02/2020  Yes [provider]  senna-docusate (SENOKOT-S) 8.6-50 MG tablet Take 2 tablets by mouth 2 (two) times daily. 12/12/19 12/11/20 Yes Emokpae, Courage, MD  polyethylene glycol (MIRALAX / GLYCOLAX) 17  g packet Take 17 g by mouth 2 (two) times daily. 12/12/19   Roxan Hockey, MD  Calcium Carb-Cholecalciferol (CALCIUM CARBONATE-VITAMIN D3) 600-400 MG-UNIT TABS Take 1 tablet by mouth daily. Patient not taking: Reported on 01/23/2020  01/23/20  [provider]   Allergies  Allergen Reactions  . Atorvastatin Anaphylaxis and Rash    Leg pain  . Adhesive [Tape]     Tears skin  . Reglan [Metoclopramide]     Rash (see 05/2019 hospital notes), but was on other medications at the time so unclear which agent  . Heparin Rash    Thrombocytopenia/ HIT   Review of Systems  Unable to perform ROS: Mental status change    Physical Exam Vitals and nursing note reviewed.  Constitutional:      General: He is in acute distress.     Appearance: He is ill-appearing.     Comments: frail  Skin:    Comments: Diffuse bruising on arms  Neurological:     Mental Status: He is disoriented.  Psychiatric:     Comments: Anxious in appearance     Vital Signs: BP (!) 118/50   Pulse 83   Temp (!) 96.5 F (35.8 C) (Axillary)   Resp (!) 24   Ht 5\' 7"  (1.702 m)   Wt 66.7 kg   SpO2 100%   BMI 23.02 kg/m  Pain Scale: 0-10   Pain Score: 0-No pain   SpO2: SpO2: 100 % O2 Device:SpO2: 100 % O2 Flow Rate: .O2 Flow Rate (L/min): 11 L/min  IO: Intake/output summary:   Intake/Output Summary (Last 24 hours) at Feb 16, 2020 1001 Last data filed at 02-16-20 4967 Gross per 24 hour  Intake 412.5 ml  Output --  Net 412.5 ml    LBM: Last BM Date:  (PTA) Baseline Weight: Weight: 66.7 kg Most recent weight: Weight: 66.7 kg     Palliative Assessment/Data: PPS: 10%     Thank you for this consult. Palliative medicine will continue to follow and assist as needed.   Time In: 0903 Time RFF:6384  Time Total: 71 mins Greater than 50%  of this time was spent counseling and coordinating care related to the above assessment and plan.  Signed by: Mariana Kaufman, AGNP-C Palliative Medicine     Please contact Palliative Medicine Team phone at [phone] for questions and concerns.  For individual provider: See Shea Evans

## 2020-02-16 NOTE — Plan of Care (Signed)

## 2020-02-16 NOTE — Discharge Summary (Signed)
Death Summary  Jesse Murphy SNK:[id number] DOB: Jul 11, 1928 DOA: Feb 24, 2020  PCP: Leeanne Rio, MD PCP/Office notified: office communicated through [place].  Admit date: Feb 24, 2020 Date of Death: February 27, 2020  Final Diagnoses:  Acute Respiratory failure with hypoxia ([place]) COVID-19 HCAP Atrial fibrillation with RVR (HCC) ESRD on dialysis ([place]) Anemia of chronic disease Thrombocytopenia and prior hx of HIT Elevated troponin Hx of CAD Chronic combined systolic and diastolic HF Hx of GERD/GI bleed    History of present illness:  As per H&P written by Dr. Clearence Ped  On 01/26/20 [name] a89 y.o.male,with history of heparin-induced thrombocytopenia, Pseudomonas pneumonia, paroxysmal atrial fibrillation on amiodarone, myocardial infarction, ischemic cardiomyopathy, hypertension, end-stage renal disease on dialysis Monday Wednesday Friday, and more presents to the ER with a chief complaint of dyspnea. Patient reports that shortness of breath started at 6:10 PM on Feb 24, 2020. Reports that he has had a productive cough associated that it had yellow sputum. He denies any fever, body aches, congestion. Patient still does make urine and reports he has had no dysuria, but he is chronically incontinent. He reports his dyspnea was better with oxygen that they put on him at dialysis. He reports is better now with oxygen on here. Reports that this happened night before last at his last dialysis appointment as well. He has no chest pain, no palpitations. He is vaccinated for Covid. Patient is full code. Patient has no other complaints at this time.  In the ED Temp 97.7, heart rate 99 208, respiratory rate 21-28, blood pressure 115/59 ED provider explains further that patient had dyspnea on day before yesterday at dialysis EMS transported to an ER and they reported that he had A. fib with RVR in route, but it was resolved by the time they reached the ER. Patient was no longer  dyspneic and did not have hypoxia at that time so he was discharged home. This time the difference was he was still in A. fib with RVR upon arrival to the ER with a rate of 131 on EKG, and he was hypoxic requiring 7 L with EMS titrated down to 2 L in the ER. No leukocytosis with a white blood cell count of 6.3, hemoglobin 8.8 Chemistry panel reveals a BUN of 31 and a creatinine of 2.62 in the setting of known ESRD BNP was 1437, LDH 234, D-dimer 2.41, fibrinogen 756 Covid positive Blood cultures pending Chest x-ray showed small left pleural effusion with basilar airspace disease concerning for pneumonia EKG shows a flutter with rate of 131 QTC 470 Patient was started on cefepime and bank Admission requested for further work-up of acute hypoxic respiratory failure and pneumonia   Hospital Course:  Acute respiratory failure with hypoxia in the setting of COVID-19 infection and pneumonia -Patient with elevated WBCs and procalcitonin on admission. -Chest x-ray demonstrated bilateral infiltrate -he was aggressively treated with remdesivir, steroids and broad-spectrum antibiotics  -CT angiogram was neg for PE. -cultures remained without growth -patient was transitioned to DNR/comfort care after full discussion by MD and palliative care with family. -he expired at 11:00 am  A. Fib with RVR/prior hx of paroxysmal A. fib -throughout evening/night on 01/27/20 patient experienced episode of A. fib with RVR, that unfortunately make him hemodynamically unstable and ended requiring to be cardioverted and started on pressors. -patient's BP remains soft and despite successful cardioversion overnight he ended to be back on A. Fib in the morning of February 27, 2020 (while still receiving IV amiodarone). -after discussing with family decision made to keep in full  comfort care with symptomatic management -initial plan was to transfer him home with hospice care; but patient was unstable for transferring and actively  dying. -at 11:00 am he peacefully expired.   End-stage renal disease -Patient chronically on dialysis prior to admission. -Nephrology service was consulted for dialysis continuation, but given patient active decline and decision for comfort no further HD was offered.  Chronic anemia in the setting of renal failure -Iron and Epogen as per nephrology recommendation. -No signs of overt bleeding appreciated -Patient was dehydrated on presentation with higher levels of hemoglobin appreciated most likely from hemoconcentration prior to IV antibiotics, gentle fluid resuscitation and remdesivir/steroids infusion.  Thrombocytopenia/prior hx of HIT -In the setting most likely of ESRD -Continue SCDs for DVT prophylaxis -No heparin products.  Elevated troponin -In the setting of end-stage renal disease, A. Fibr with RVR and acute lung infection -Patient denies chest pain   Time: 25 minutes  Signed:  Barton Dubois  [facility] 02/10/2020, 12:37 PM

## 2020-02-16 NOTE — Progress Notes (Signed)
At approx. 1045 Dr. Barton Dubois, MD and this RN were in the room assessing the patient. Patient vitals were stable at this time. At 1050 the patient began to desat from the high/mid 90s, to 12s, then 39s, then 62s. O2 on the high flow was turned up to 15L and the nonrebreather was put on and turned up to 15+ at this time. Patient sats did not recover. Patient continued to consistently desat. Roselyn Reef, RRT was in the rom with this RN. Patient hear rate was 0 and breathing had stopped.  Time of death was called at 1100 by verification from this RN, Sherry Ruffing, RN, and Dr. Barton Dubois

## 2020-02-16 NOTE — Progress Notes (Signed)
Notified MD multiple times of pt status throughout shift. Notified MD of pt unable to take PO medications. Notified MD that pt's IV infiltrated post CT scan, amio could not be infused d/t only having one IV access. MD stated to "call ED RN to obtain IV access, we will restart amio drip once an IV is inserted." Dellis Filbert ED RN was able to place an [place] IV in the LUA and amio drip was restarted at 30mg /hr. Notified MD of pt MAP's less than 21, MD stated to "observe BP and restart Neo if necessary"

## 2020-02-16 NOTE — Consult Note (Signed)
[facility] Renal Consultation Note    Indication for Consultation:  Management of ESRD/hemodialysis; anemia, hypertension/volume and secondary hyperparathyroidism  HPI: Jesse Murphy is a 87 y.o. male with PMH significant for CAD, ESRD on HD TTS, P. 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  Per EMS he was noted to be in atrial fibrillation with RVR but HR improved en route to ED.  He was diagnosed with covid-19 on 01/20/20 despite being fully immunized.  In the ED he remained in atrial fibrillation with HR of 131 and hypoxic requiring 7 liters.  CXR revealed small left pleural effusion with evidence of PNA.  He was admitted to the SDU and started on broad spectrum antibiotics, remdesivir, and steroids.  Last night he developed tachycardia with HR in the 140's and became profoundly hypotensive.  Code was called and did not respond to adenosine, cardizem, phenylephrine, and eventual underwent synchronized cardioversion with was successful.  He has had increasing oxygen demands and discussions are underway with family regarding transition to comfort care and home with hospice.    Past Medical History:  Diagnosis Date  . Anemia   . CAD (coronary artery disease)    a. s/p PCI in 1992 b. Coronary CT in 01/2018 showing extensive coronary calcification; FFR indeterminate --> medical management pursued at that time as patient not interested in repeat cath. c. s/p CABGx3 in 02/2019.  . Cancer [facility])    prostate  . Cardiogenic shock ([place])   . CHB (complete heart block) (HCC)    a. due to vagal event in 2021, requiring temp pacer.  . Chronic combined systolic and diastolic CHF (congestive heart failure) ([place])   . Debilitated   . Decubitus ulcer of coccyx, unspecified pressure ulcer stage   .  Dyspnea   . End stage renal disease ([place])   . Esophagitis   . H/O tracheostomy    a. during admit in 2021 for respiratory failure.  . Hematuria   . Hypertension   . Hypoalbuminemia   . Ischemic cardiomyopathy   . Myocardial infarction ([place])   . PAF (paroxysmal atrial fibrillation) ([place])   . 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 Pleural effusion, left    a. s/p pleurX catheter  . Pressure ulcer of both heels   . Pseudomonas pneumonia ([place])   . Thrombocytopenia ([place])   . Walker as ambulation aid    also uses wheelchair   Past Surgical History:  Procedure Laterality Date  . BASCILIC VEIN TRANSPOSITION Right 09/10/2019   Procedure: RIGHT ARM 1ST STAGE BASCILIC VEIN TRANSPOSITION;  Surgeon: Angelia Mould, MD;  Location: [place];  Service: Vascular;  Laterality: Right;  . BASCILIC VEIN TRANSPOSITION Right 11/06/2019   Procedure: RIGHT UPPER EXTREMITY SECOND STAGE BASCILIC VEIN TRANSPOSITION;  Surgeon: Angelia Mould, MD;  Location: [place];  Service: Vascular;  Laterality: Right;  . BIOPSY  10/15/2019   Procedure: BIOPSY;  Surgeon: Daneil Dolin, MD;  [facility];  Service: Endoscopy;;  Distal Rectal Ulceration biopsies   . CHEST TUBE INSERTION Left 04/23/2019   Procedure: INSERTION PLEURAL DRAINAGE CATHETER TO DRAIN LEFT PLEURAL EFFUSION;  Surgeon: Ivin Poot, MD;  Location: [place];  Service: Thoracic;  Laterality: Left;  . CLIPPING OF ATRIAL APPENDAGE N/A 03/16/2019   Procedure: CLIPPING OF LEFT  ATRIAL APPENDAGE - USING  ATRICLIP SIZE 40;  Surgeon: Prescott Gum, Collier Salina, MD;  Location: [place];  Service: Open Heart Surgery;  Laterality: N/A;  . COLONOSCOPY N/A 08/25/2017   Procedure: COLONOSCOPY;  Surgeon: Rogene Houston, MD;  [facility];  Service: Endoscopy;  Laterality: N/A;  . CORONARY ARTERY BYPASS GRAFT N/A 03/16/2019   Procedure: CORONARY ARTERY BYPASS GRAFTING (CABG), ON PUMP, TIMES THREE, USING LEFT INTERNAL MAMMARY ARTERY, RIGHT GREATER SAPHENOUS VEIN  HARVESTED ENDOSCOPICALLY;  Surgeon: Ivin Poot, MD;  Location: [place];  Service: Open Heart Surgery;  Laterality: N/A;  swan only  . CYSTOSCOPY WITH FULGERATION N/A 04/30/2019   Procedure: CYSTOSCOPY WITH FULGERATION;  Surgeon: Irine Seal, MD;  Location: [place];  Service: Urology;  Laterality: N/A;  . FLEXIBLE SIGMOIDOSCOPY N/A 10/15/2019   Procedure: FLEXIBLE SIGMOIDOSCOPY with propofol;  Surgeon: Daneil Dolin, MD;  [facility];  Service: Endoscopy;  Laterality: N/A;  . FLEXIBLE SIGMOIDOSCOPY N/A 11/24/2019   Procedure: FLEXIBLE SIGMOIDOSCOPY;  Surgeon: Harvel Quale, MD;  [facility];  Service: Gastroenterology;  Laterality: N/A;  . HERNIA REPAIR    . IR FLUORO GUIDE CV LINE LEFT  04/23/2019  . IR FLUORO GUIDE CV LINE RIGHT  05/24/2019  . IR US GUIDE VASC ACCESS LEFT  04/23/2019  . MAZE N/A 03/16/2019   Procedure: MAZE;  Surgeon: Ivin Poot, MD;  Location: [place];  Service: Open Heart Surgery;  Laterality: N/A;  . PLACEMENT OF IMPELLA LEFT VENTRICULAR ASSIST DEVICE Right 03/27/2019   Procedure: Placement Of Impella Left Ventricular Assist Device using ABIOMED Impella 5.5 with SmartAssist Device.;  Surgeon: Wonda Olds, MD;  [facility];  Service: Thoracic;  Laterality: Right;  . POLYPECTOMY  11/24/2019   Procedure: POLYPECTOMY;  Surgeon: Harvel Quale, MD;  [facility];  Service: Gastroenterology;;  sigmoid  . PROSTATE SURGERY    . REMOVAL OF IMPELLA LEFT VENTRICULAR ASSIST DEVICE N/A 04/10/2019   Procedure: REMOVAL OF IMPELLA LEFT VENTRICULAR ASSIST DEVICE WITH INSERTION OF RIGHT FEMORAL ARTERIAL LINE;  Surgeon: Ivin Poot, MD;  Location: [place];  Service: Open Heart Surgery;  Laterality: N/A;  . REMOVAL OF PLEURAL DRAINAGE CATHETER Left 07/02/2019   Procedure: REMOVAL OF PLEURAL DRAINAGE CATHETER;  Surgeon: Ivin Poot, MD;  Location: [place];  Service: Thoracic;  Laterality: Left;  . RIGHT/LEFT HEART CATH AND  CORONARY ANGIOGRAPHY N/A 03/15/2019   Procedure: RIGHT/LEFT HEART CATH AND CORONARY ANGIOGRAPHY;  Surgeon: Burnell Blanks, MD;  Location: [place] [facility];  Service: Cardiovascular;  Laterality: N/A;  . TEE WITHOUT CARDIOVERSION N/A 03/16/2019   Procedure: TRANSESOPHAGEAL ECHOCARDIOGRAM (TEE);  Surgeon: Prescott Gum, Collier Salina, MD;  Location: [place];  Service: Open Heart Surgery;  Laterality: N/A;  . TRACHEOSTOMY TUBE PLACEMENT N/A 03/27/2019   Procedure: TRACHEOSTOMY placed using Shiley 8DCT Cuffed.;  Surgeon: Wonda Olds, MD;  [facility];  Service: Thoracic;  Laterality: N/A;  . TRANSURETHRAL RESECTION OF BLADDER NECK N/A 05/13/2019   Procedure: CYSTOSCOPY CLOT EVACUATION FULGRATION CYSTOGRAM AND INSTILLATION OF FORMALIN;  Surgeon: Lucas Mallow, MD;  Location: [place];  Service: Urology;  Laterality: N/A;   Family History:   Family History  Problem Relation Age of Onset  . CAD Mother 32  . CAD Father 30  . CAD Sister   . CAD Brother    Social History:  reports that he quit smoking about 29 years ago. His smoking use included cigarettes. He has a 12.50 pack-year  smoking history. He has never used smokeless tobacco. He reports that he does not drink alcohol and does not use drugs. Allergies  Allergen Reactions  . Atorvastatin Anaphylaxis and Rash    Leg pain  . Adhesive [Tape]     Tears skin  . Reglan [Metoclopramide]     Rash (see 05/2019 hospital notes), but was on other medications at the time so unclear which agent  . Heparin Rash    Thrombocytopenia/ HIT   Prior to Admission medications   Medication Sig Start Date End Date Taking? Authorizing Provider  acetaminophen (TYLENOL) 500 MG tablet Take 500 mg by mouth every 6 (six) hours as needed for moderate pain.   Yes [provider]  acetaminophen (TYLENOL) 650 MG CR tablet Take 650 mg by mouth every 8 (eight) hours as needed for pain.   Yes [provider]  amiodarone (PACERONE) 200 MG tablet Take 1  tablet (200 mg total) by mouth See admin instructions. oral amiodarone 200mg  bid x 4 weeks, then 200mg  daily after that Patient taking differently: Take 200 mg by mouth daily. 12/12/19  Yes Roxan Hockey, MD  aspirin 81 MG chewable tablet Chew 81 mg by mouth in the morning.  05/21/19  Yes [provider]  b complex-vitamin c-folic acid (NEPHRO-VITE) 0.8 MG TABS tablet Take 1 tablet by mouth at bedtime.   Yes [provider]  carboxymethylcellulose (REFRESH PLUS) 0.5 % SOLN Place 1 drop into both eyes 4 (four) times daily. 10/08/19  Yes [provider]  clotrimazole (MYCELEX) 10 MG troche Take 10 mg by mouth 2 (two) times daily as needed (yeast).   Yes [provider]  Ensure Plus (ENSURE PLUS) LIQD Take 237 mLs by mouth 2 (two) times daily between meals.   Yes [provider]  midodrine (PROAMATINE) 10 MG tablet Take 10 mg by mouth 2 (two) times daily.  10/08/19 10/07/20 Yes [provider]  nystatin ointment (MYCOSTATIN) Apply 1 application topically daily as needed (Apply to Penis as needed if itching occurs.).  06/22/19  Yes [provider]  ondansetron (ZOFRAN) 4 MG tablet Take 4 mg by mouth every 8 (eight) hours as needed for nausea or vomiting.   Yes [provider]  pantoprazole (PROTONIX) 40 MG tablet Take 1 tablet (40 mg total) by mouth daily. 12/12/19 12/11/20 Yes Emokpae, Courage, MD  predniSONE (DELTASONE) 10 MG tablet Take 40 mg by mouth daily.  02/04/2020  Yes [provider]  senna-docusate (SENOKOT-S) 8.6-50 MG tablet Take 2 tablets by mouth 2 (two) times daily. 12/12/19 12/11/20 Yes Emokpae, Courage, MD  polyethylene glycol (MIRALAX / GLYCOLAX) 17 g packet Take 17 g by mouth 2 (two) times daily. 12/12/19   Roxan Hockey, MD  Calcium Carb-Cholecalciferol (CALCIUM CARBONATE-VITAMIN D3) 600-400 MG-UNIT TABS Take 1 tablet by mouth daily. Patient not taking: Reported on 01/23/2020  01/23/20  [provider]   Current Facility-Administered Medications  Medication Dose Route Frequency Provider Last Rate Last Admin  . acetaminophen (TYLENOL) tablet 650 mg  650 mg Oral Q6H PRN Zierle-Ghosh, Asia B, DO   650 mg at 01/26/20 2132  . amiodarone (NEXTERONE PREMIX) 360-4.14 MG/200ML-% (1.8 mg/mL) IV infusion  30 mg/hr Intravenous Continuous Zierle-Ghosh, Asia B, DO 16.67 mL/hr at 02/01/20 0530 30 mg/hr at Feb 01, 2020 0530  . ascorbic acid (VITAMIN C) tablet 500 mg  500 mg Oral Daily Barton Dubois, MD   500 mg at 01/26/20 1559  . aspirin chewable tablet 81 mg  81 mg Oral  Daily Minda Ditto, RPH   81 mg at 01/26/20 7322  . ceFEPIme (MAXIPIME) 1 g in sodium chloride 0.9 % 100 mL IVPB  1 g Intravenous Q24H Zierle-Ghosh, Asia B, DO 200 mL/hr at 02/12/2020 0254 Infusion Verify at 2020-02-12 2706  . Chlorhexidine Gluconate Cloth 2 % PADS 6 each  6 each Topical Daily Zierle-Ghosh, Asia B, DO   6 each at 01/27/20 1230  . chlorpheniramine-HYDROcodone (TUSSIONEX) 10-8 MG/5ML suspension 5 mL  5 mL Oral Q12H PRN Zierle-Ghosh, Asia B, DO   5 mL at 01/26/20 2132  . dexamethasone (DECADRON) injection 6 mg  6 mg Intravenous Q24H Green, Terri L, RPH   6 mg at 01/27/20 1447  . Ensure Plus liquid 237 mL  237 mL Oral BID BM Zierle-Ghosh, Asia B, DO      . guaiFENesin-dextromethorphan (ROBITUSSIN DM) 100-10 MG/5ML syrup 10 mL  10 mL Oral Q4H PRN Zierle-Ghosh, Asia B, DO      . HYDROmorphone (DILAUDID) injection 0.5 mg  0.5 mg Intravenous Q2H PRN Earlie Counts, NP      . midodrine (PROAMATINE) tablet 10 mg  10 mg Oral BID WC Green, Terri L, RPH   10 mg at 01/27/20 0923  . ondansetron (ZOFRAN) tablet 4 mg  4 mg Oral Q6H PRN Zierle-Ghosh, Asia B, DO       Or  . ondansetron (ZOFRAN) injection 4 mg  4 mg Intravenous Q6H PRN Zierle-Ghosh, Asia B, DO      . oxyCODONE (Oxy IR/ROXICODONE) immediate release tablet 5 mg  5 mg Oral Q4H PRN Zierle-Ghosh, Asia B, DO   5 mg at 01/26/20 2132  . oxyCODONE (ROXICODONE INTENSOL) 20 MG/ML  concentrated solution 5 mg  5 mg Sublingual Q2H PRN Earlie Counts, NP      . pantoprazole (PROTONIX) EC tablet 40 mg  40 mg Oral Daily Zierle-Ghosh, Asia B, DO   40 mg at 01/26/20 0952  . phenylephrine (NEOSYNEPHRINE) 10-0.9 MG/250ML-% infusion  0-400 mcg/min Intravenous Titrated Zierle-Ghosh, Asia B, DO   Stopped at 01/27/20 2015  . polyethylene glycol (MIRALAX / GLYCOLAX) packet 17 g  17 g Oral BID Zierle-Ghosh, Asia B, DO   17 g at 01/26/20 2030  . polyethylene glycol (MIRALAX / GLYCOLAX) packet 17 g  17 g Oral Daily PRN Zierle-Ghosh, Asia B, DO      . remdesivir 100 mg in sodium chloride 0.9 % 100 mL IVPB  100 mg Intravenous Daily Zierle-Ghosh, Asia B, DO 200 mL/hr at 01/27/20 2326 Infusion Verify at 01/27/20 2326  . senna-docusate (Senokot-S) tablet 2 tablet  2 tablet Oral BID Zierle-Ghosh, Asia B, DO   2 tablet at 01/26/20 2029  . vancomycin (VANCOCIN) IVPB 750 mg/150 ml premix  750 mg Intravenous Q M,W,F-HD Zierle-Ghosh, Asia B, DO      . zinc sulfate capsule 220 mg  220 mg Oral Daily Barton Dubois, MD   220 mg at 01/26/20 1559   Labs: Basic Metabolic Panel: Recent Labs  Lab 01/26/20 0656 01/27/20 0516 01/27/20 1925  NA 137 136 136  K 4.1 4.4 5.0  CL 94* 94* 94*  CO2 26 27 24   GLUCOSE 112* 93 101*  BUN 40* 60* 71*  CREATININE 2.95* 3.68* 4.03*  CALCIUM 7.7* 7.7* 7.7*  PHOS 5.4* 6.5*  --    Liver Function Tests: Recent Labs  Lab 01/26/20 0656 01/27/20 0516 01/27/20 1925  AST 20 25 28   ALT 15 16 17   ALKPHOS 46 50 56  BILITOT 1.1  0.9 0.9  PROT 4.9* 4.7* 4.9*  ALBUMIN 2.1* 1.9* 2.0*   No results for input(s): LIPASE, AMYLASE in the last 168 hours. No results for input(s): AMMONIA in the last 168 hours. CBC: Recent Labs  Lab 01/23/20 2227 01/16/2020 2030 01/26/20 0656 01/27/20 0516 01/27/20 1925  WBC 5.7 6.3 6.1 7.8 10.1  NEUTROABS 5.1 5.9 5.7 7.3  --   HGB 9.8* 8.8* 8.0* 7.6* 8.0*  HCT 33.7* 30.7* 27.6* 26.2* 27.9*  MCV 109.8* 111.2* 109.5* 107.8* 109.0*  PLT  90* 95* 71* 73* 79*   Cardiac Enzymes: No results for input(s): CKTOTAL, CKMB, CKMBINDEX, TROPONINI in the last 168 hours. CBG: Recent Labs  Lab 01/27/20 2319 02/10/2020 0754  GLUCAP 90 77   Iron Studies:  Recent Labs    01/27/20 0517  FERRITIN 1,750*   Studies/Results: CT ANGIO CHEST PE W OR WO CONTRAST  Result Date: 02-10-20 CLINICAL DATA:  Respiratory failure, hypoxia, elevated D-dimer, COVID pneumonia EXAM: CT ANGIOGRAPHY CHEST WITH CONTRAST TECHNIQUE: Multidetector CT imaging of the chest was performed using the standard protocol during bolus administration of intravenous contrast. Multiplanar CT image reconstructions and MIPs were obtained to evaluate the vascular anatomy. CONTRAST:  172mL OMNIPAQUE IOHEXOL 350 MG/ML SOLN COMPARISON:  None. FINDINGS: Cardiovascular: There is adequate opacification of the pulmonary arterial tree. There is no intraluminal filling defect identified to suggest acute pulmonary embolism. The central pulmonary arteries are of normal caliber. Coronary artery bypass grafting and left atrial clipping has been performed. Global cardiac size is within normal limits. No pericardial effusion. A pseudoaneurysm is seen arising from the anterior thoracic aorta between the sites of saphenous vein graft implantation, possibly at the site of aortic cannulation, measuring 9 mm x 12 mm x 13 mm on axial image # 54/6 and sagittal reformat # 99/10. No mediastinal hematoma identified. Moderate atherosclerotic calcifications seen throughout the thoracic aorta. Proximal arch vasculature is notable for a 9 mm pseudoaneurysm arising from the right axillary artery, best seen on axial image # 23/6, possibly the site of prior bypass graft. Left internal jugular hemodialysis catheter is seen with its tip within the right atrium. Mediastinum/Nodes: Thyroid unremarkable. No pathologic thoracic adenopathy. The esophagus is unremarkable. Lungs/Pleura: Severe centrilobular emphysema. Multifocal  pulmonary infiltrates are identified asymmetrically within the upper lobes and superior segment of the right lower lobe in keeping with atypical infection. There is superimposed collapse and consolidation of the lower lobes bilaterally. Small right and moderate left pleural effusions are present. No central obstructing mass identified. There is, however, debris noted within the central airways of the lower lobes bilaterally. No pneumothorax. Upper Abdomen: No acute abnormality. Musculoskeletal: Degenerative changes are seen within the thoracic spine. No lytic or blastic bone lesions are seen. Review of the MIP images confirms the above findings. IMPRESSION: No pulmonary embolism. Multifocal pulmonary infiltrates most in keeping with atypical infection with collapse and consolidation of the lower lobes bilaterally. Associated small right and moderate left pleural effusions. Severe centrilobular emphysema. 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 No mediastinal hematoma. Vascular consultation is recommended for further management. Aortic Atherosclerosis (ICD10-I70.0) and Emphysema (ICD10-J43.9). Electronically Signed   By: Fidela Salisbury MD   On: Feb 10, 2020 02:30   DG CHEST PORT 1 VIEW  Result Date: 01/27/2020 CLINICAL DATA:  Respiratory failure with hypoxia EXAM: PORTABLE CHEST 1 VIEW COMPARISON:  02/15/2020 FINDINGS: Bilateral airspace opacities have worsened since prior study. This is most confluent in the  left lower lobe. No effusions. Heart is borderline size. Left dialysis catheter in place with the tip in the right atrium, unchanged. Prior CABG. IMPRESSION: Worsening bilateral airspace disease, most confluent in the left lower lobe. Findings could reflect edema or infection. Electronically Signed   By: Rolm Baptise M.D.   On: 01/27/2020 20:13    ROS: Pertinent items are noted in  HPI. Physical Exam: Vitals:   02-10-20 0500 2020/02/10 0600 Feb 10, 2020 0700 02-10-20 0756  BP: (!) 119/44 (!) 116/45 (!) 118/50   Pulse: 80 79 81 83  Resp: 10 (!) 24 18 (!) 24  Temp:    (!) 96.5 F (35.8 C)  TempSrc:    Axillary  SpO2: 99% 92% 100% 100%  Weight:      Height:          Weight change:   Intake/Output Summary (Last 24 hours) at 2020-02-10 0957 Last data filed at 02-10-2020 7408 Gross per 24 hour  Intake 412.5 ml  Output --  Net 412.5 ml   BP (!) 118/50   Pulse 83   Temp (!) 96.5 F (35.8 C) (Axillary)   Resp (!) 24   Ht 5\' 7"  (1.702 m)   Wt 66.7 kg   SpO2 100%   BMI 23.02 kg/m  Physical exam: unable to complete due to COVID + status.  In order to preserve PPE equipment and to minimize exposure to providers.  Notes from other caregivers reviewed  Dialysis Access: [facility] TDC, maturing RUE AVF  Dialysis Orders: [facility], 4 hr, 180, 400/800, EDW 62.5kg, 2K/2.25Ca; CVC 9/30/ Phos 3.3, Ca 8.2, PTH 173 10/7 last post wt 61.9kg, Hb 8, mircera 225 q2wks Assessment/Plan: 1.  Acute hypoxic respiratory failure due to Covid-19 Pneumonia- with increasing oxygen demands despite fully vaccinated (including booster). 2. Atrial fibrillation with RVR- refractory/recurrent.  S/p synchronized cardioversion last night.  Continue with amiodarone. 3.  ESRD -  Could not tolerate his last dialysis due to tachycardia, hypotension, and worsening SOB.  He has had a progressive decline in his overall medical condition as well as functional status and quality of life.  I agree with transitioning to comfort care and possibly home with hospice although he has high oxygen requirements and is covid positive (unclear if family is also covid positive).  4.  Hypertension/volume  - remains hypotensive. 5.  Anemia  - due to ESRD and chronic GI blood loss.  On ESA and has received multiple blood transfusions. 6.  Metabolic bone disease -  stable 7.  Nutrition - per primary 8. Disposition-  overall has a very poor prognosis and has a steady decline in functional status as well as quality of life.  Agree with transition to comfort care and possibly home with hospice.  He has not been doing well with dialysis and do not think he would tolerate it given ongoing issues.    Donetta Potts, MD [place] Pager [phone] 2020-02-10, 9:57 AM

## 2020-02-16 NOTE — Progress Notes (Signed)
[facility] referral [id number]

## 2020-02-16 DEATH — deceased

## 2020-02-20 ENCOUNTER — Ambulatory Visit: Payer: Medicare Other | Admitting: Cardiothoracic Surgery

## 2020-11-12 IMAGING — DX DG CHEST 1V PORT
1 series · 1 of 1 positions shown · non-contrast
Comparison: Portable chest 03/25/2019 and earlier.

CLINICAL DATA: 89-year-old male postoperative day 10 status post
CABG, left atrium MAZE procedure.

EXAM:
PORTABLE CHEST 1 VIEW

[chest ap]
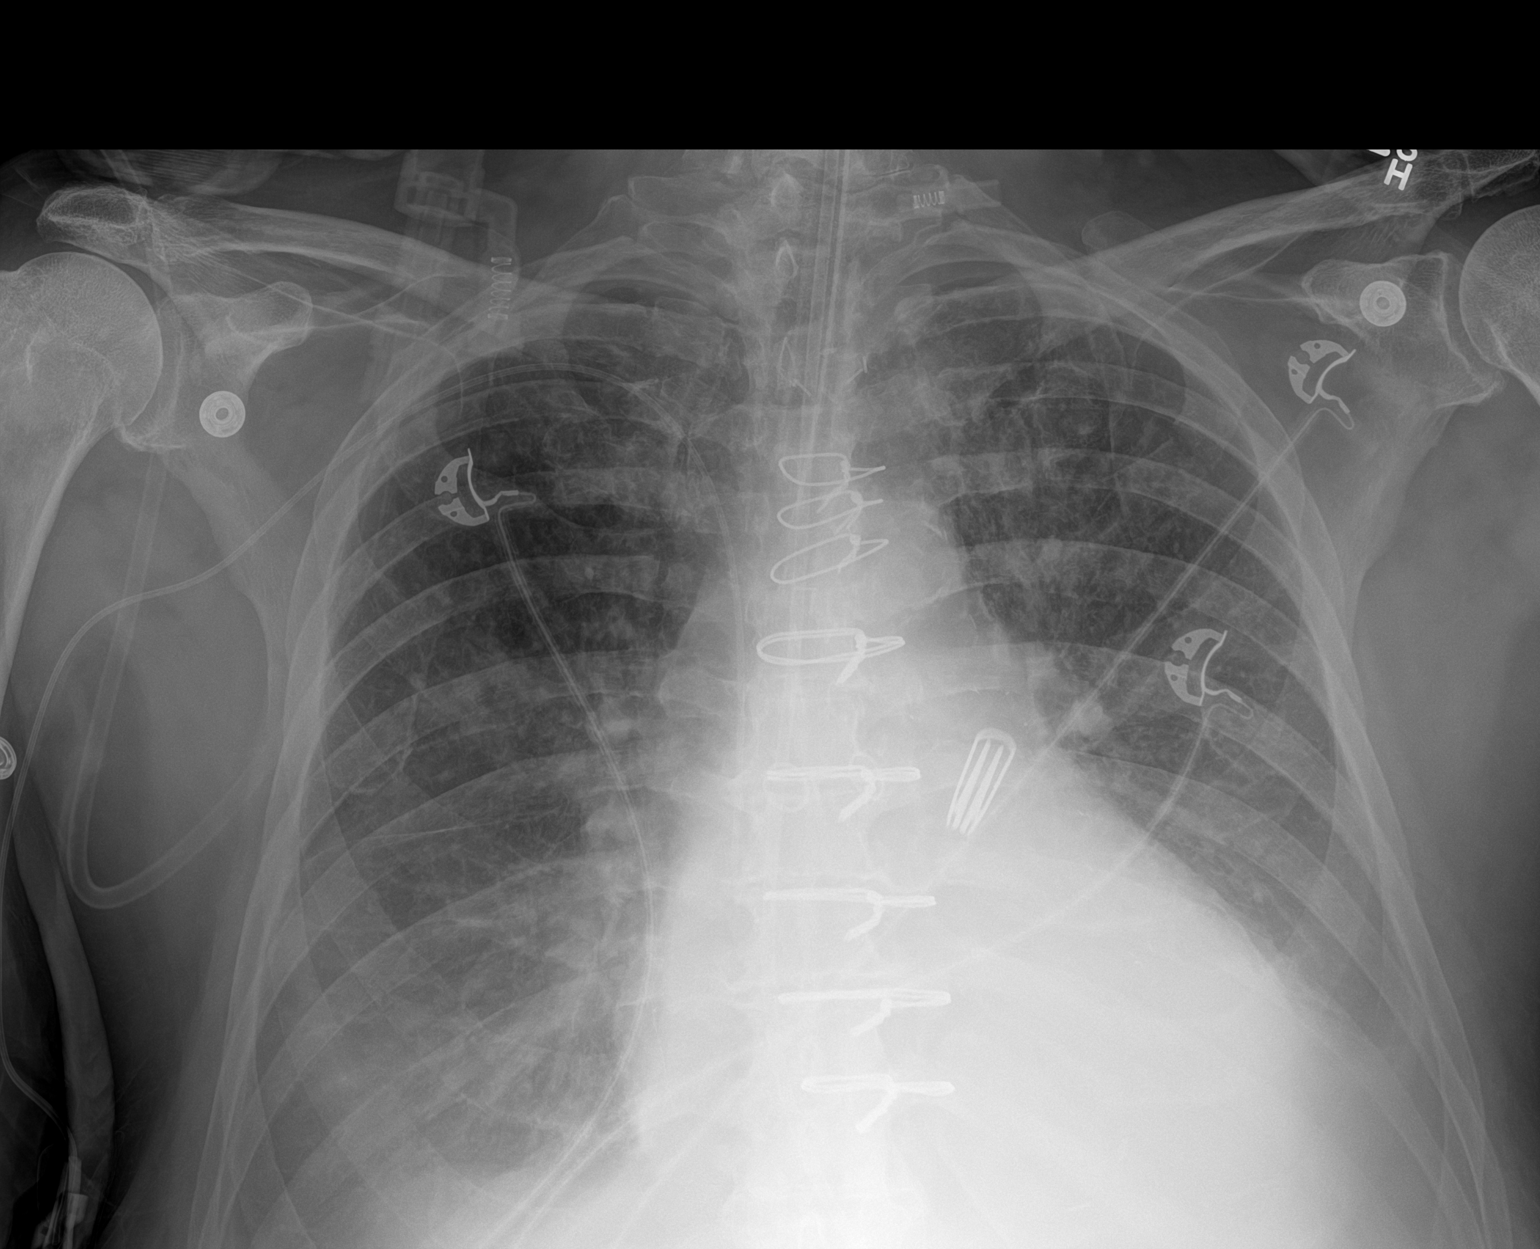

[1 of 1 positions shown; findings below may reference images not displayed]

FINDINGS: Portable AP supine view at 1666 hours. Intubated. Endotracheal tube
tip in good position just below the clavicles. Stable visible
enteric tube, tip not included. Stable right PICC line.

Stable cardiomegaly and mediastinal contours. Stable pulmonary
vascularity, no overt edema suspected. Veiling left greater than
right lung base and dense retrocardiac opacity appears stable. No
pneumothorax. There is a minimally displaced anterior left 2nd rib
fracture which is stable in retrospect.
IMPRESSION: 1. Intubated with ET tube tip in good position. Other lines and
tubes appear stable.
2. Stable ventilation with evidence of bilateral pleural effusions
and lower lobe collapse or consolidation.
3. Minimally displaced anterior left 2nd rib fracture, stable across
this recent series of exams.

## 2020-11-13 IMAGING — DX DG CHEST 1V PORT
1 series · 1 of 1 positions shown · non-contrast
Comparison: 03/27/2019

CLINICAL DATA: Status post tracheostomy placement

EXAM:
PORTABLE CHEST 1 VIEW

[chest ap]
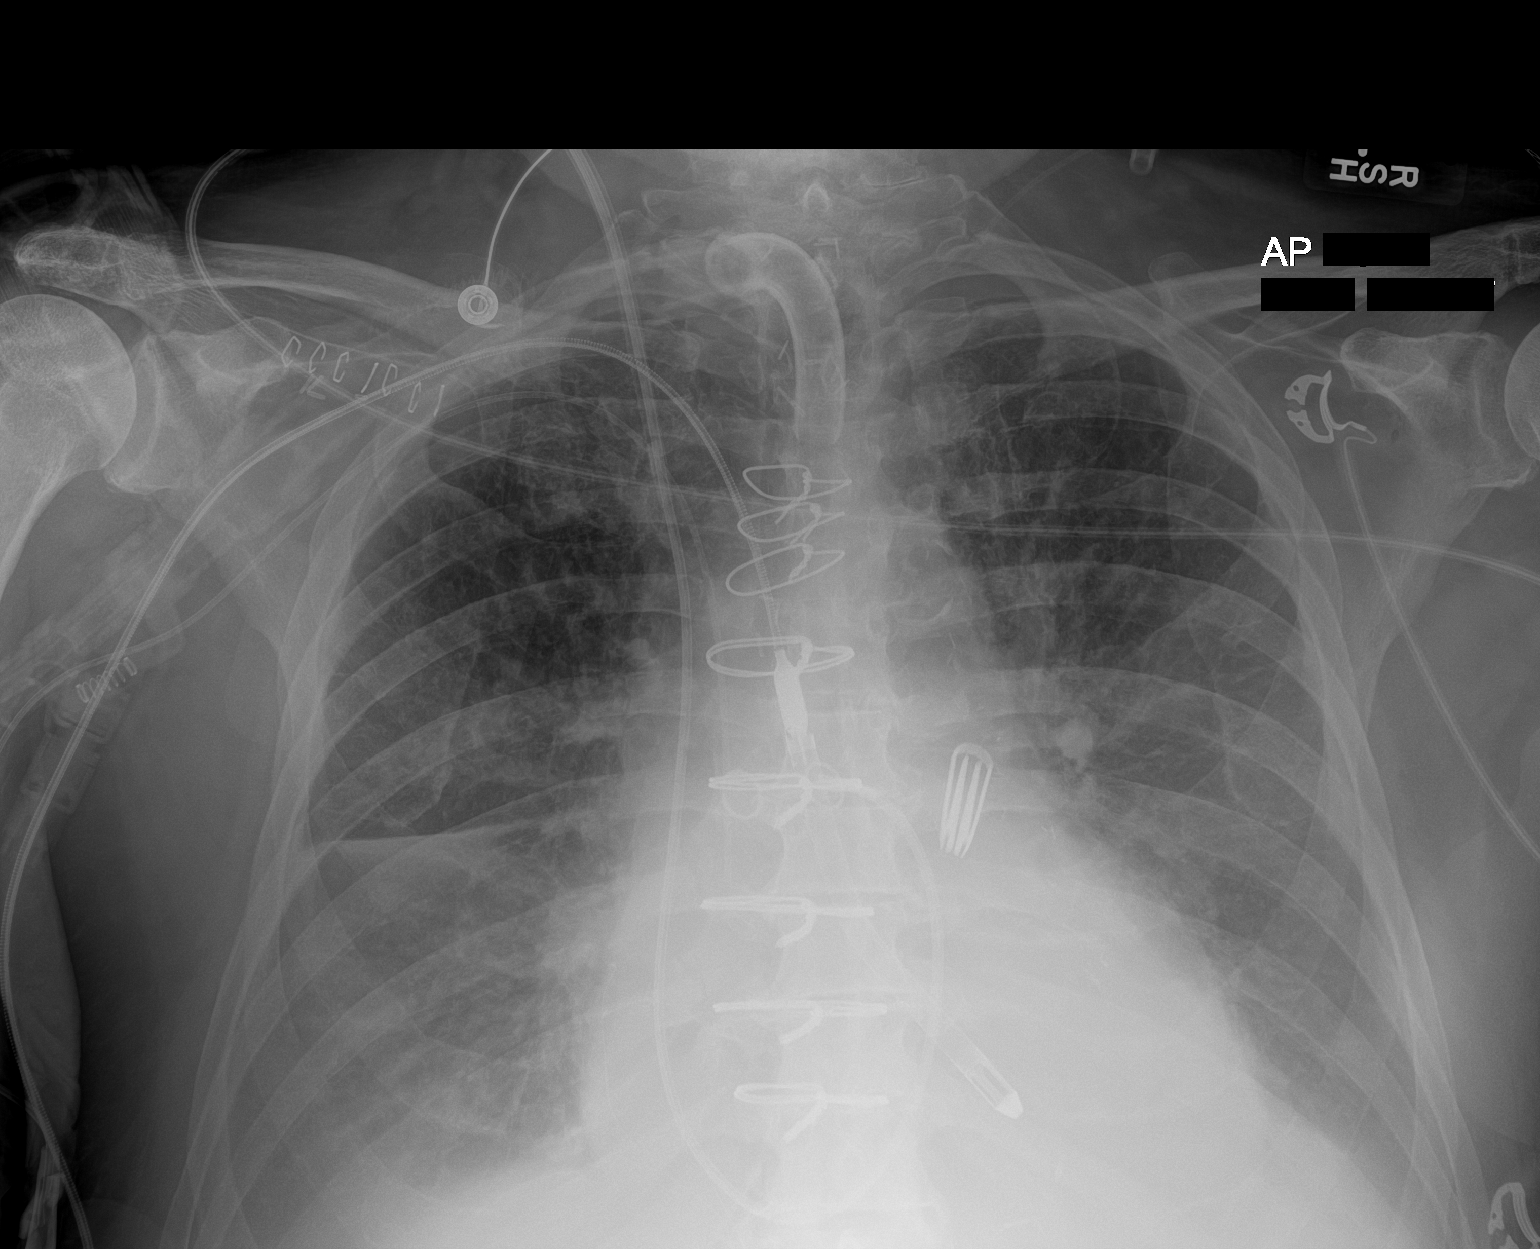

[1 of 1 positions shown; findings below may reference images not displayed]

FINDINGS: Cardiac shadow is prominent but stable. Impella device is noted in
place. Postsurgical changes are again seen and stable. Right-sided
PICC line is noted at the cavoatrial junction. New tracheostomy tube
is seen. Feeding catheter has been removed in the interval. Diffuse
haziness is noted over both lungs likely related to a posteriorly
layering effusion bilaterally. Mild vascular congestion is again
seen. Swan-Ganz catheter is noted in satisfactory position.
IMPRESSION: Mild vascular congestion and small effusions posteriorly.

Tubes and lines as described.

## 2020-11-14 IMAGING — DX DG CHEST 1V PORT
1 series · 1 of 1 positions shown · non-contrast
Comparison: 03/27/2019

CLINICAL DATA: CABG.  Tracheostomy.  Impella placement.

EXAM:
PORTABLE CHEST 1 VIEW

[chest ap]
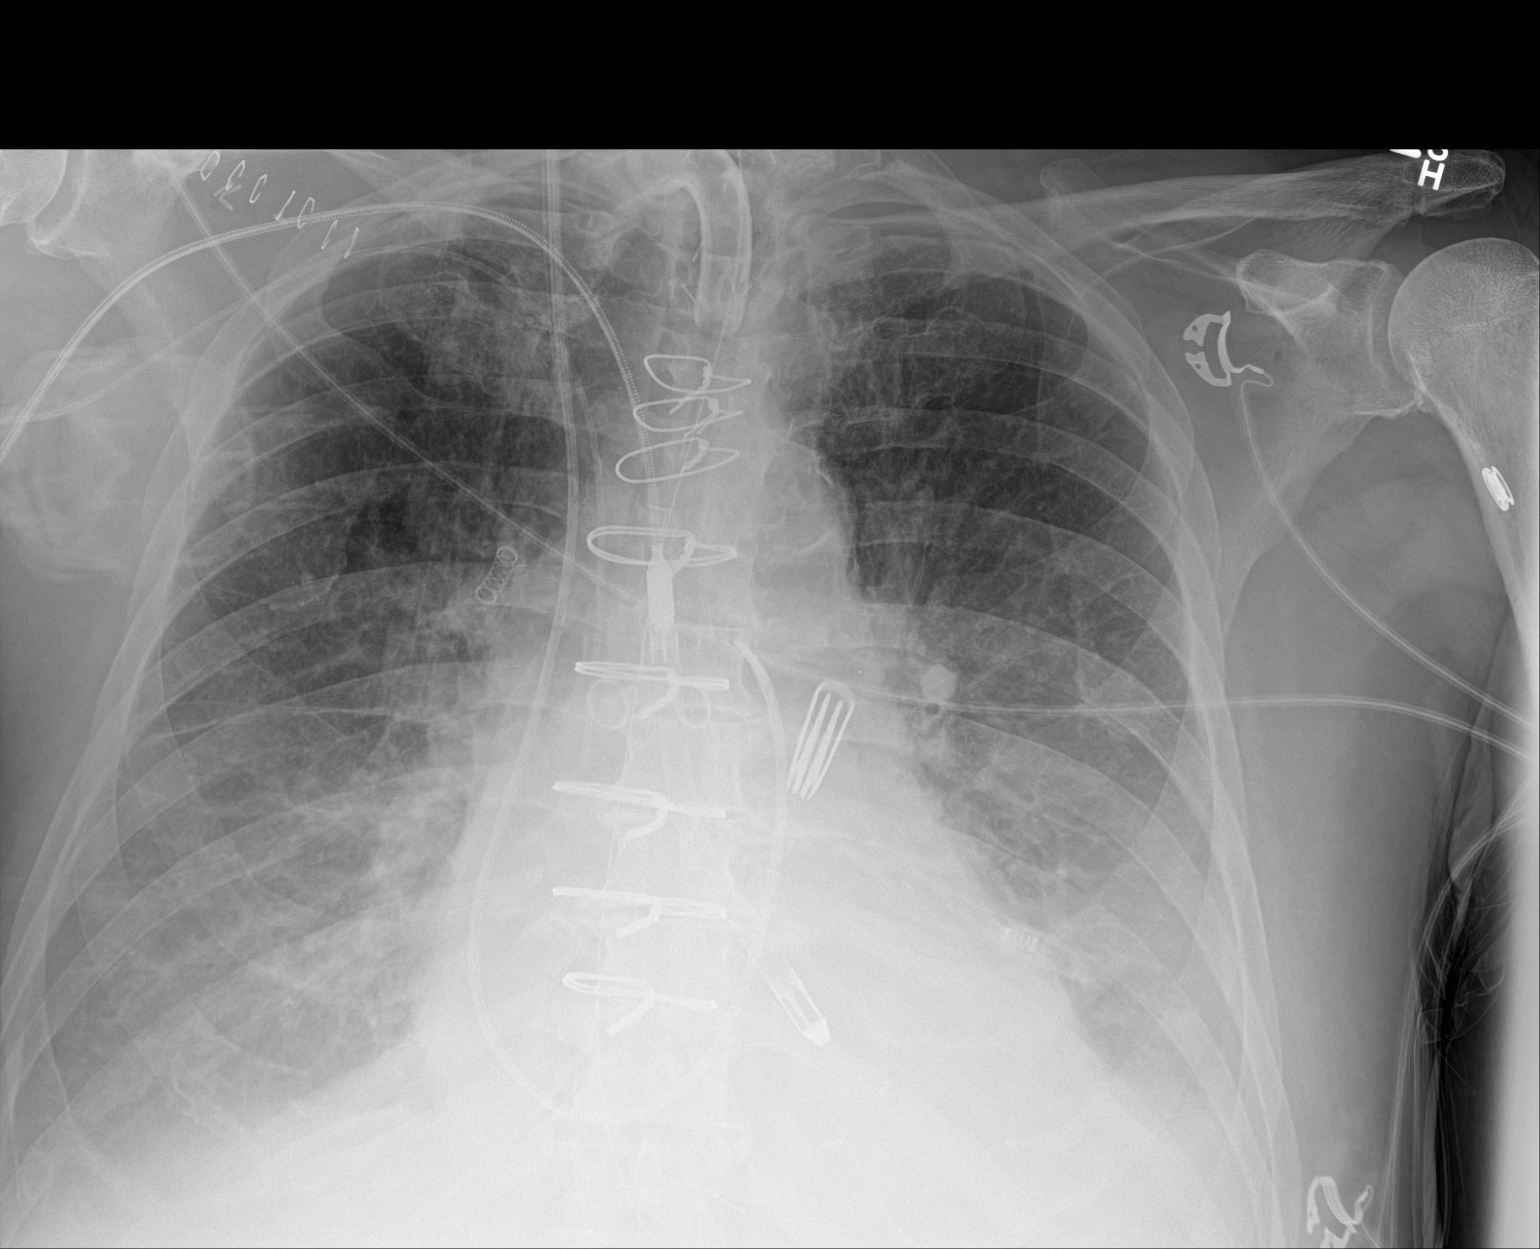

[1 of 1 positions shown; findings below may reference images not displayed]

FINDINGS: Right-sided Impella device unchanged with tip projecting over the
left heart. Tracheostomy tube unchanged. Right IJ Swan-Ganz
catheter has tip over the main pulmonary artery segment. Right-sided
PICC line unchanged. Enteric tube courses into the region of the
stomach and off the film as tip is not visualized.

Lungs are adequately inflated demonstrate persistent hazy bilateral
perihilar and bibasilar opacification likely mild interstitial edema
with small bilateral pleural effusions/bibasilar atelectasis.
Cardiomediastinal silhouette and remainder of the exam is unchanged.
IMPRESSION: 1. Stable hazy opacification over the perihilar bibasilar regions
likely interstitial edema with bilateral effusions/bibasilar
atelectasis.

2.  Tubes and lines as described.

## 2020-11-15 IMAGING — DX DG CHEST 1V PORT
1 series · 1 of 1 positions shown · non-contrast
Comparison: 03/27/2018

CLINICAL DATA: Check Impella placement

EXAM:
PORTABLE CHEST 1 VIEW

[chest ap]
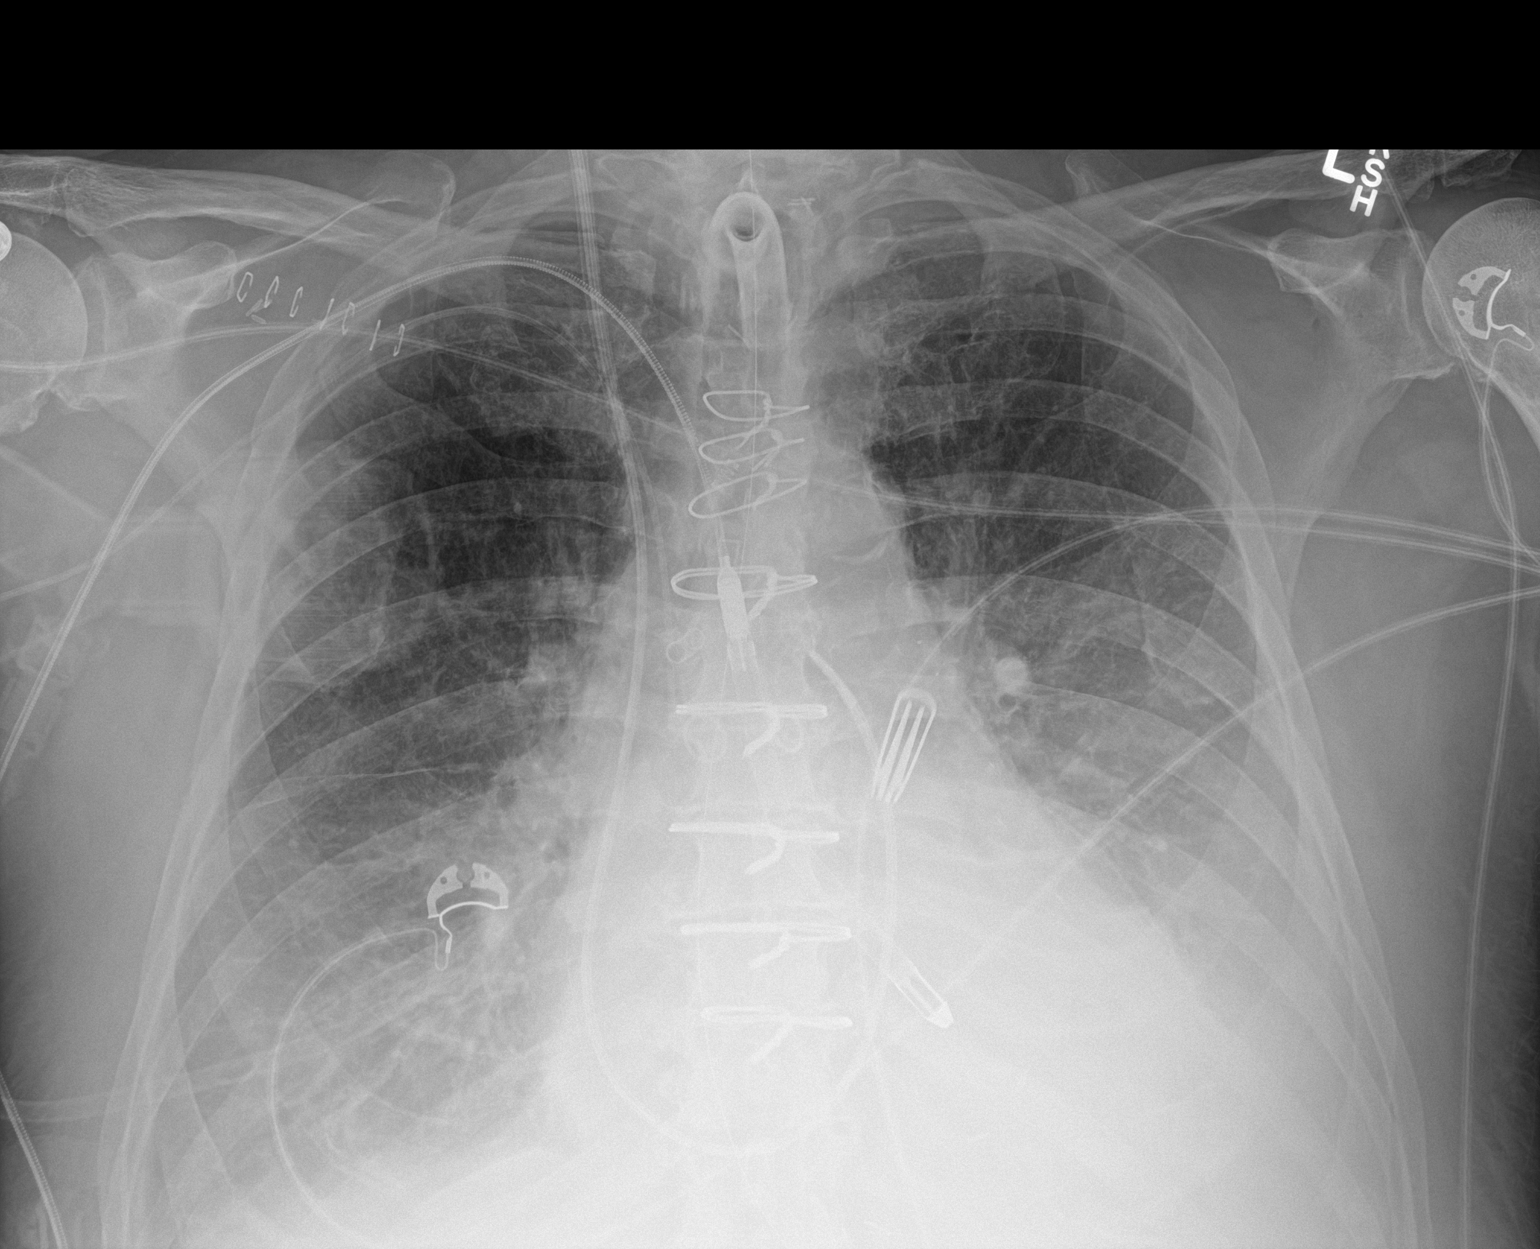

[1 of 1 positions shown; findings below may reference images not displayed]

FINDINGS: Cardiac shadow is enlarged but stable. Postsurgical changes are
again noted. Impella device is well as a Swan-Ganz catheter are
again seen and stable. Tracheostomy tube, gastric catheter and
right-sided PICC line are noted in satisfactory position. Persistent
bilateral pleural effusions are noted left slightly greater than
right layering posteriorly. Vascular congestion remains. No new
focal infiltrate is seen.
IMPRESSION: Overall stable appearance of the chest

Tubes and lines stable in appearance.

## 2020-11-16 IMAGING — DX DG CHEST 1V PORT
2 series · 2 of 2 positions shown · non-contrast
Comparison: Portable exam 4889 hours compared to 03/29/2019

CLINICAL DATA: Tracheostomy, I LVAD

EXAM:
PORTABLE CHEST 1 VIEW

[chest ap (1 of 2)]
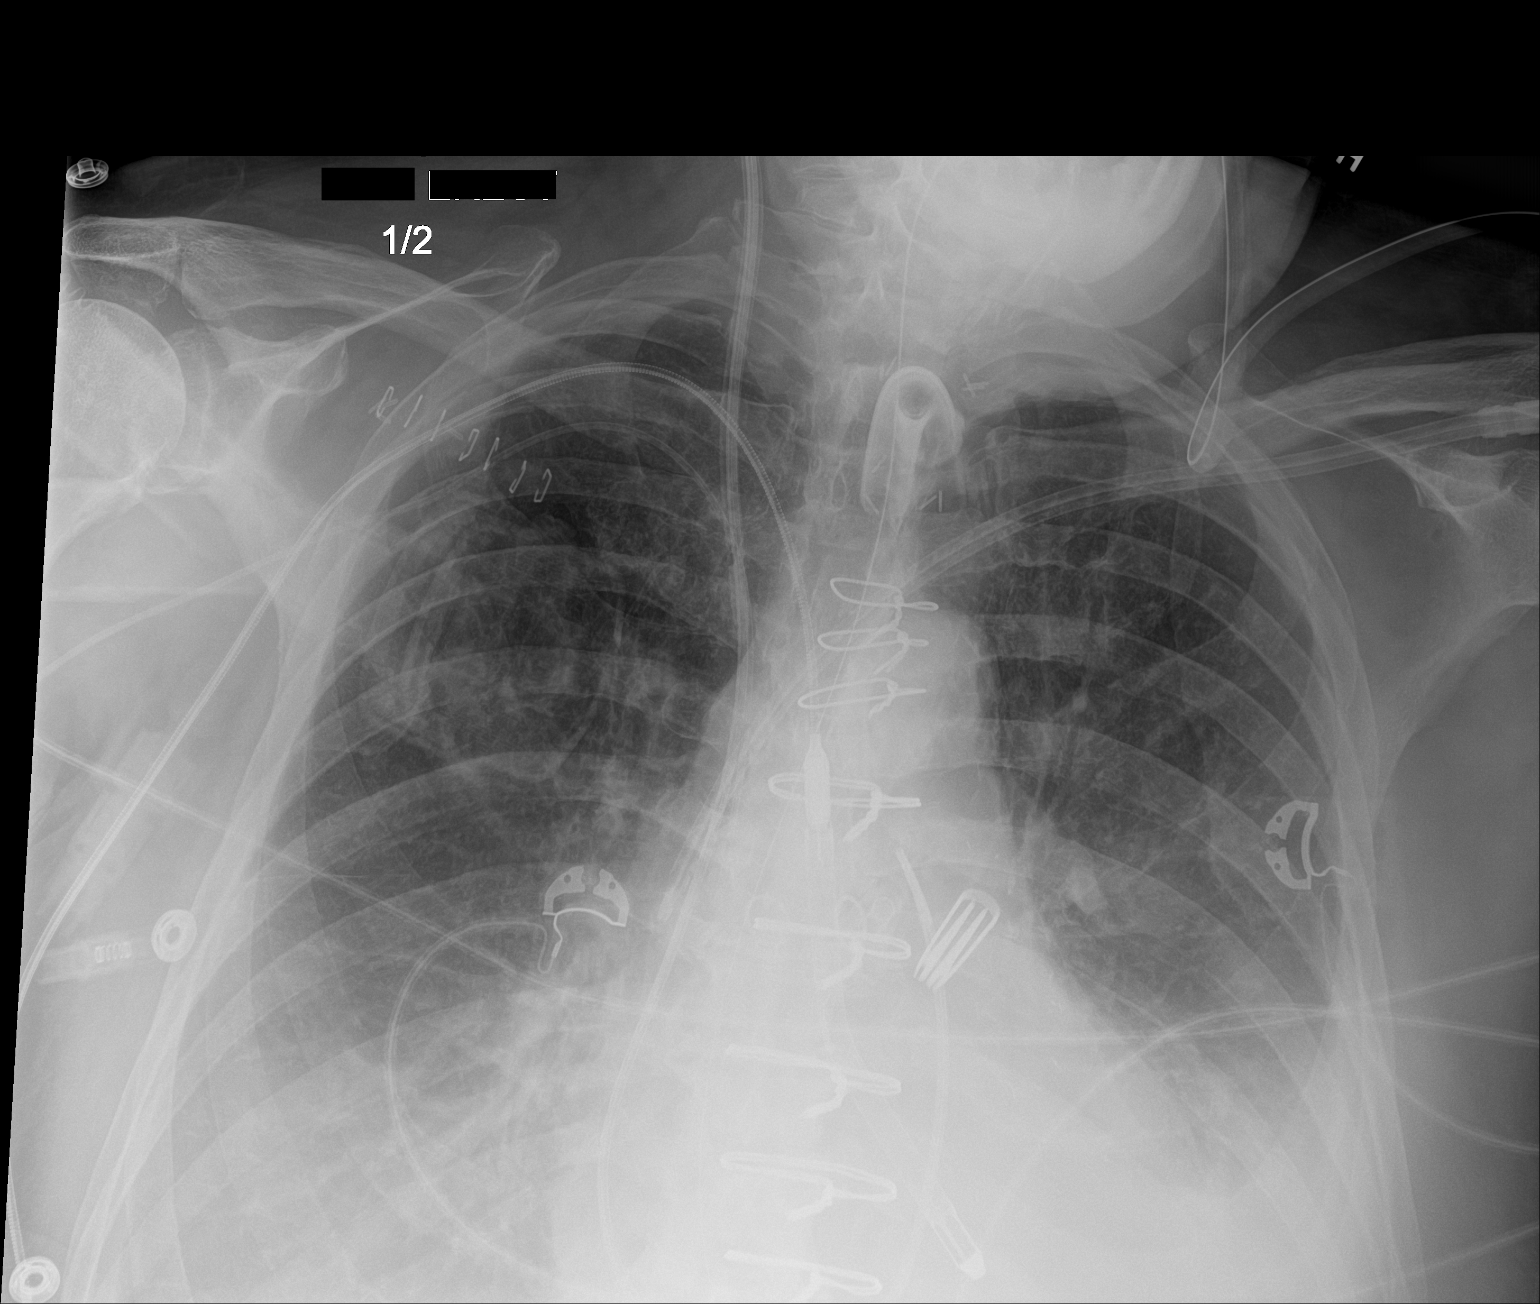

[chest ap (2 of 2)]
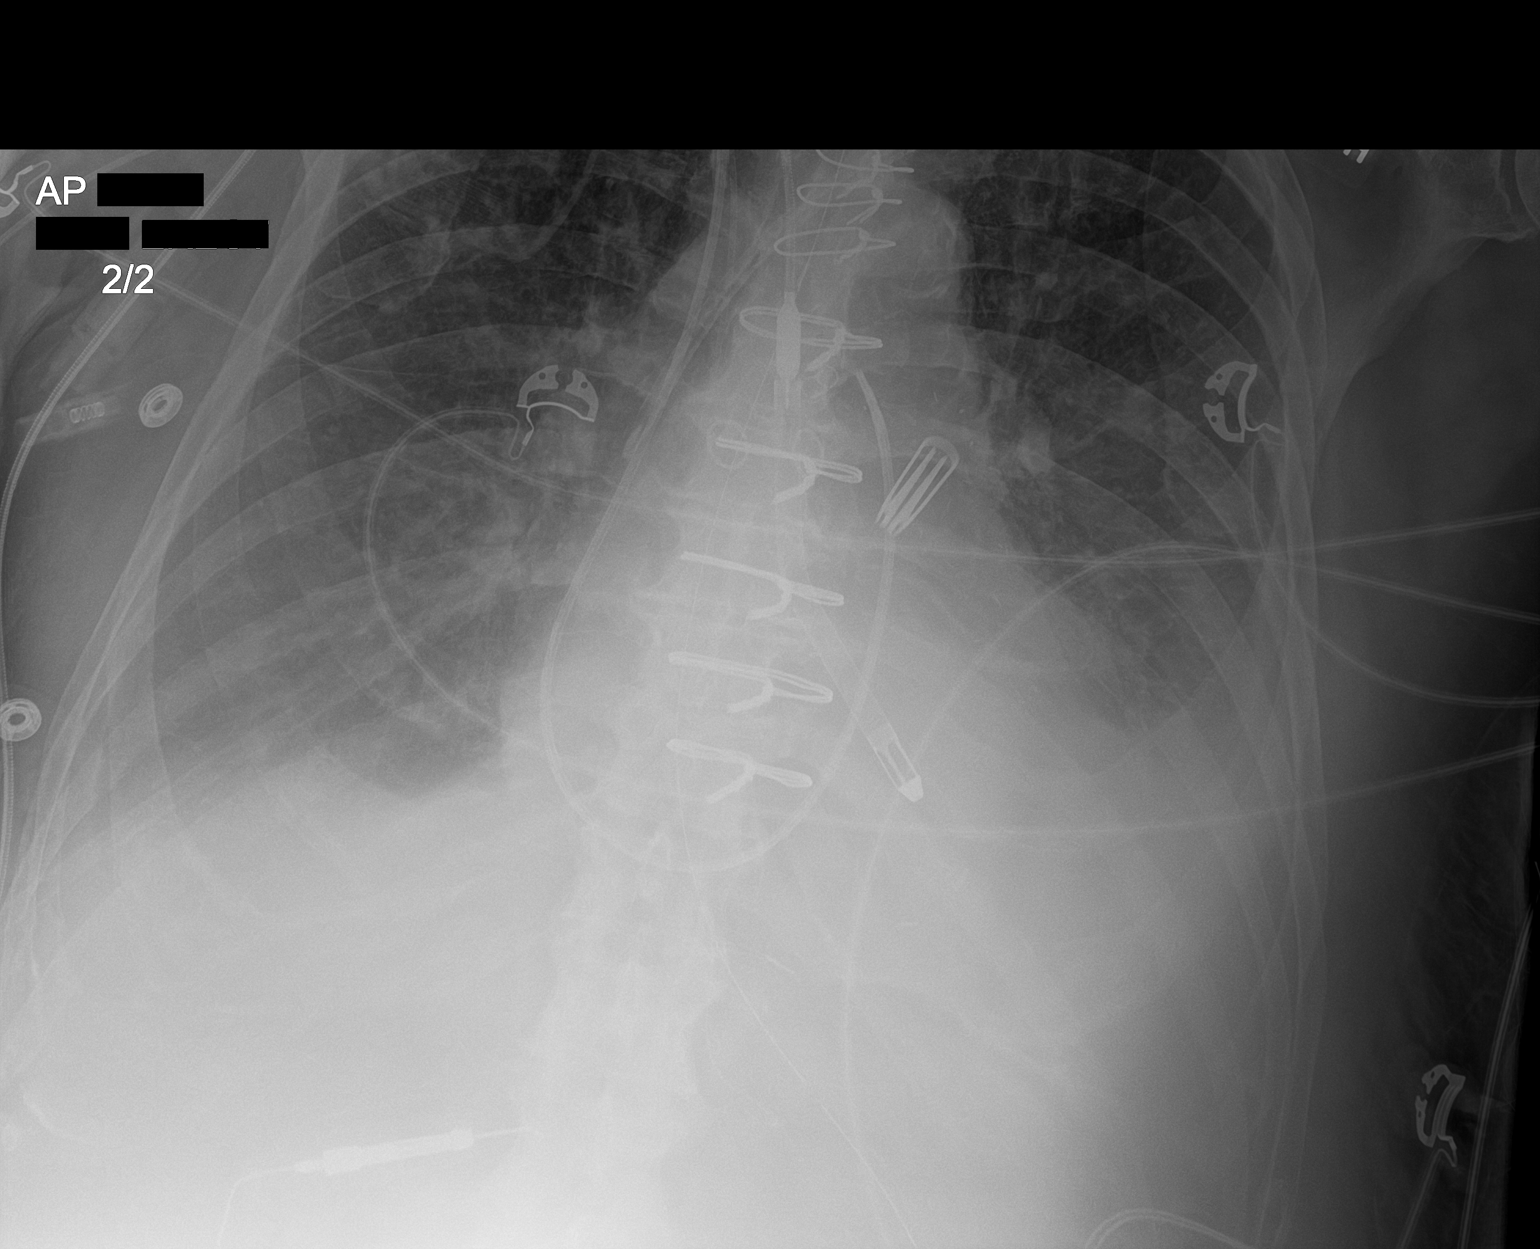

[2 of 2 positions shown; findings below may reference images not displayed]

FINDINGS: Tracheostomy tube, nasogastric tube, dual lumen LEFT subclavian
line, RIGHT jugular Swan-Ganz catheter, and RIGHT-side Impella
device unchanged.

Normal heart size post CABG and LEFT atrial appendage clipping.

Bibasilar pleural effusions and atelectasis.

Upper lungs clear.

No pneumothorax.
IMPRESSION: Bibasilar pleural effusions and atelectasis.

Stable support devices.
